# Patient Record
Sex: Female | Born: 1965 | Race: Black or African American | Hispanic: No | Smoking: Never smoker
Health system: Southern US, Community
[De-identification: ages and names within clinical notes are randomized; demographics above are authoritative.]

## PROBLEM LIST (undated history)

## (undated) DIAGNOSIS — G629 Polyneuropathy, unspecified: Secondary | ICD-10-CM

## (undated) DIAGNOSIS — M1990 Unspecified osteoarthritis, unspecified site: Secondary | ICD-10-CM

## (undated) DIAGNOSIS — E7800 Pure hypercholesterolemia, unspecified: Secondary | ICD-10-CM

## (undated) DIAGNOSIS — K649 Unspecified hemorrhoids: Secondary | ICD-10-CM

## (undated) DIAGNOSIS — C3490 Malignant neoplasm of unspecified part of unspecified bronchus or lung: Secondary | ICD-10-CM

## (undated) DIAGNOSIS — I10 Essential (primary) hypertension: Secondary | ICD-10-CM

## (undated) DIAGNOSIS — R000 Tachycardia, unspecified: Secondary | ICD-10-CM

## (undated) DIAGNOSIS — I428 Other cardiomyopathies: Secondary | ICD-10-CM

## (undated) DIAGNOSIS — E039 Hypothyroidism, unspecified: Secondary | ICD-10-CM

## (undated) DIAGNOSIS — J45909 Unspecified asthma, uncomplicated: Secondary | ICD-10-CM

## (undated) DIAGNOSIS — J189 Pneumonia, unspecified organism: Secondary | ICD-10-CM

## (undated) DIAGNOSIS — E669 Obesity, unspecified: Secondary | ICD-10-CM

## (undated) HISTORY — PX: OTHER SURGICAL HISTORY: SHX169

## (undated) HISTORY — PX: TUBAL LIGATION: SHX77

## (undated) HISTORY — PX: COLONOSCOPY: SHX174

---

## 1998-11-28 ENCOUNTER — Emergency Department (HOSPITAL_COMMUNITY): Admission: EM | Admit: 1998-11-28 | Discharge: 1998-11-28 | Payer: Self-pay | Admitting: Emergency Medicine

## 1998-11-29 ENCOUNTER — Encounter: Payer: Self-pay | Admitting: Emergency Medicine

## 1999-03-04 ENCOUNTER — Emergency Department (HOSPITAL_COMMUNITY): Admission: EM | Admit: 1999-03-04 | Discharge: 1999-03-04 | Payer: Self-pay | Admitting: *Deleted

## 1999-07-23 ENCOUNTER — Emergency Department (HOSPITAL_COMMUNITY): Admission: EM | Admit: 1999-07-23 | Discharge: 1999-07-23 | Payer: Self-pay | Admitting: Emergency Medicine

## 2002-01-30 ENCOUNTER — Ambulatory Visit (HOSPITAL_COMMUNITY): Admission: RE | Admit: 2002-01-30 | Discharge: 2002-01-30 | Payer: Self-pay | Admitting: Internal Medicine

## 2002-01-30 ENCOUNTER — Encounter: Payer: Self-pay | Admitting: Internal Medicine

## 2002-02-21 ENCOUNTER — Ambulatory Visit (HOSPITAL_COMMUNITY): Admission: RE | Admit: 2002-02-21 | Discharge: 2002-02-21 | Payer: Self-pay | Admitting: Internal Medicine

## 2002-02-21 ENCOUNTER — Encounter: Payer: Self-pay | Admitting: Internal Medicine

## 2003-04-18 ENCOUNTER — Emergency Department (HOSPITAL_COMMUNITY): Admission: EM | Admit: 2003-04-18 | Discharge: 2003-04-19 | Payer: Self-pay | Admitting: Emergency Medicine

## 2003-11-13 ENCOUNTER — Emergency Department (HOSPITAL_COMMUNITY): Admission: EM | Admit: 2003-11-13 | Discharge: 2003-11-13 | Payer: Self-pay | Admitting: Emergency Medicine

## 2004-01-22 ENCOUNTER — Emergency Department (HOSPITAL_COMMUNITY): Admission: EM | Admit: 2004-01-22 | Discharge: 2004-01-22 | Payer: Self-pay | Admitting: Emergency Medicine

## 2004-03-16 IMAGING — CT CT HEAD W/O CM
1 of 2 series · 13 of 30 positions shown, 17 images · non-contrast
Comparison: none

CLINICAL DATA: Severe headache.  Dizziness.  Blurred vision.  
 HEAD CT WITHOUT CONTRAST
 Routine noncontrast head CT was performed.  Comparison is made to prior study on 11/29/98.  
 There is no evidence of intracranial hemorrhage, brain edema, or mass effect.  No abnormal extra-axial fluid collections are seen.  The ventricles are normal in size.  
 No skull abnormalities are seen.  Chronic mucosal thickening is again seen involving the ethmoid sinuses bilaterally, which has not significantly changed.  
 IMPRESSION
 No evidence of intracranial abnormality.  
 Chronic bilateral ethmoid sinus disease again noted.

[Series 2: brain · axial · 0.49mm/px · z∈[+152,+272]mm · 13 of 28 slices shown, 17 images]
[im 2/28  brain]
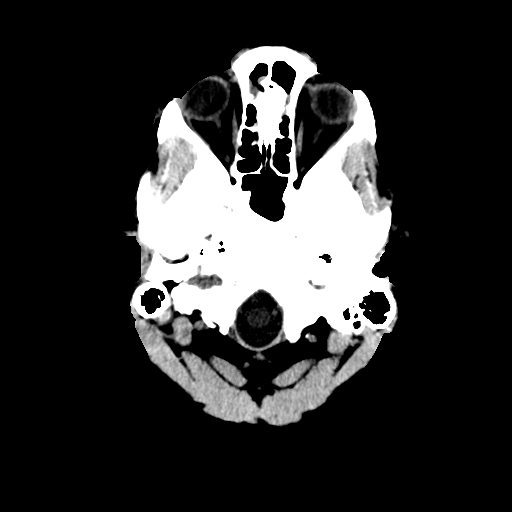
[im 2/28  bone]
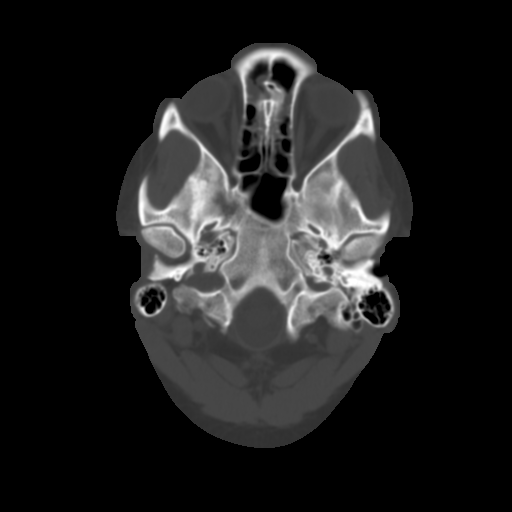
[im 4/28  brain]
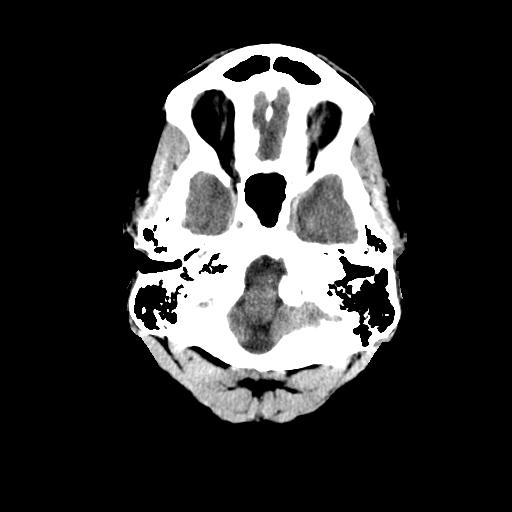
[im 6/28  brain]
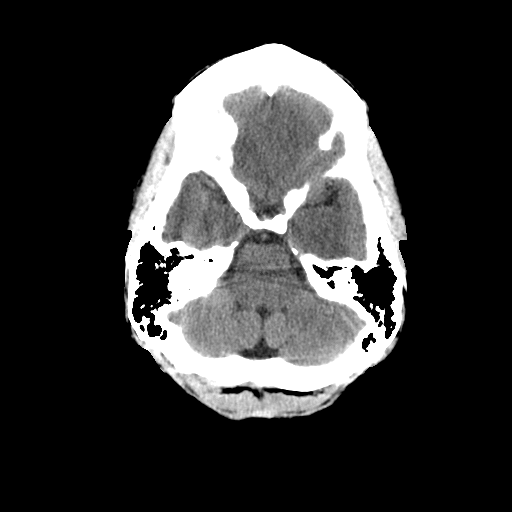
[im 8/28  brain]
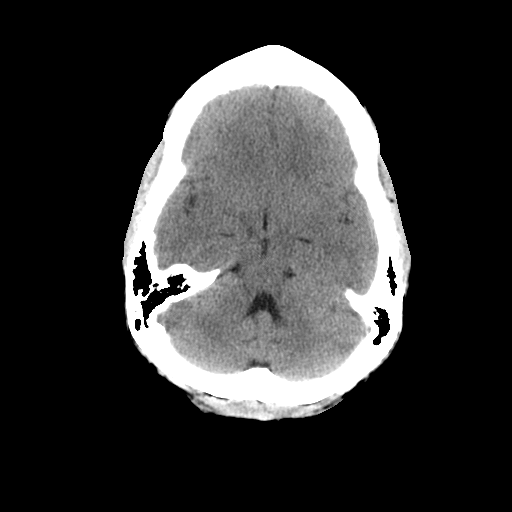
[im 10/28  brain]
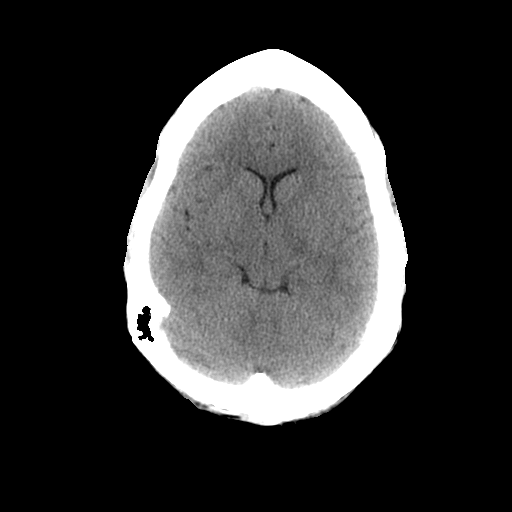
[im 10/28  bone]
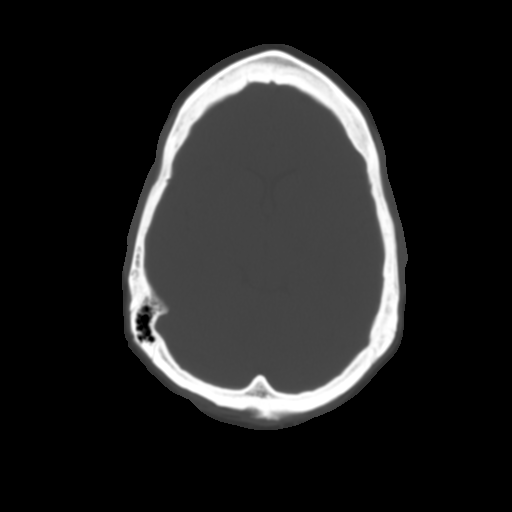
[im 12/28  brain]
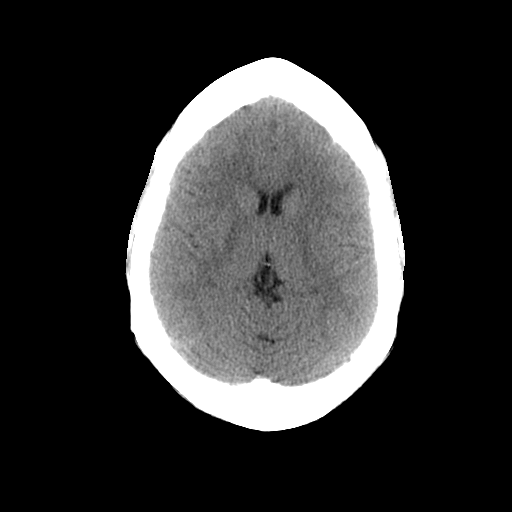
[im 14/28  brain]
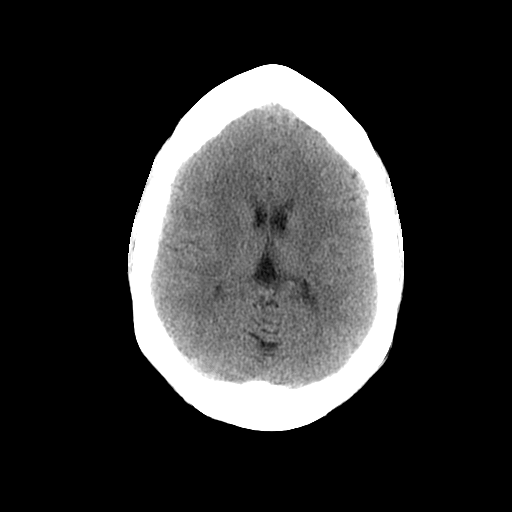
[im 16/28  brain]
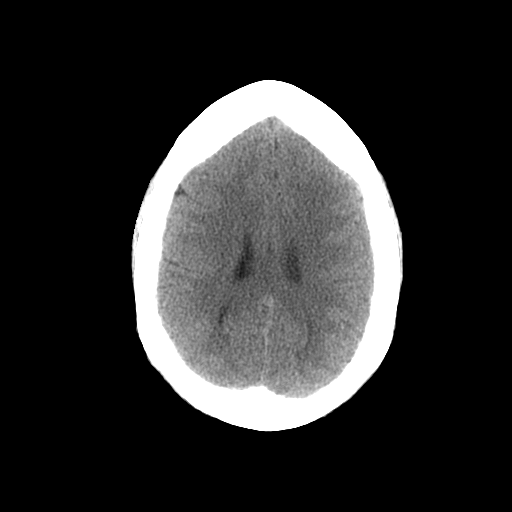
[im 18/28  brain]
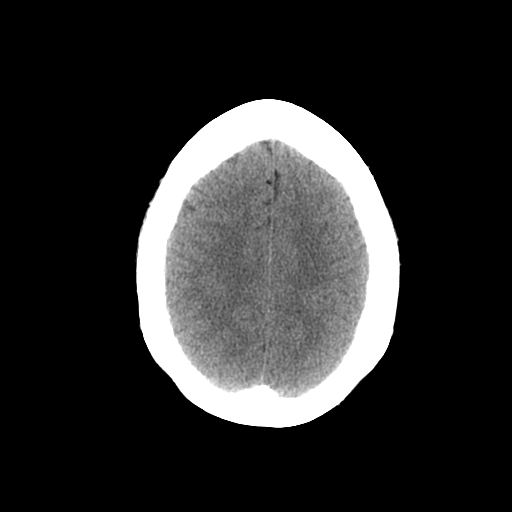
[im 18/28  bone]
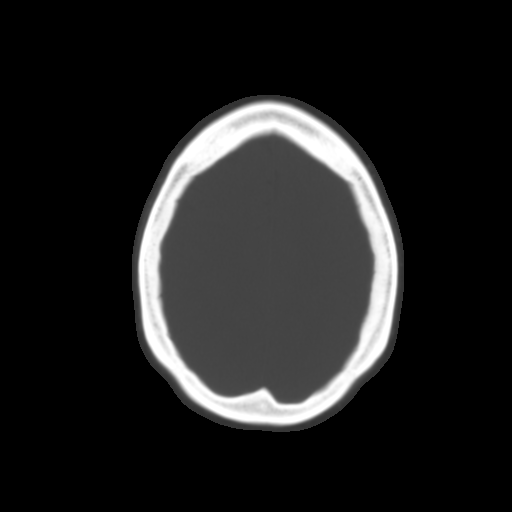
[im 20/28  brain]
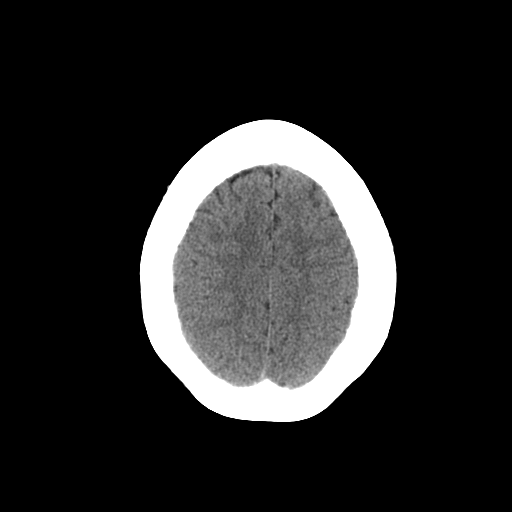
[im 22/28  brain]
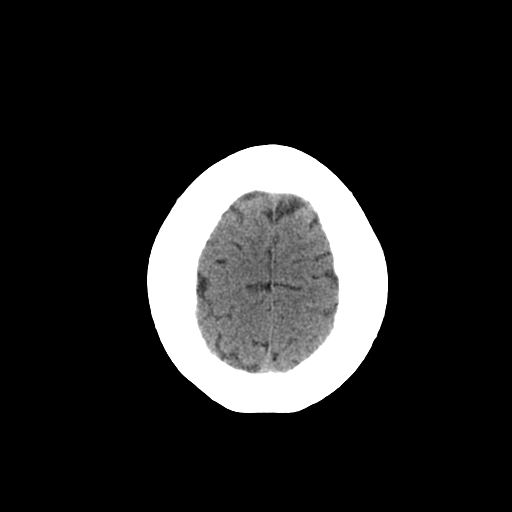
[im 24/28  brain]
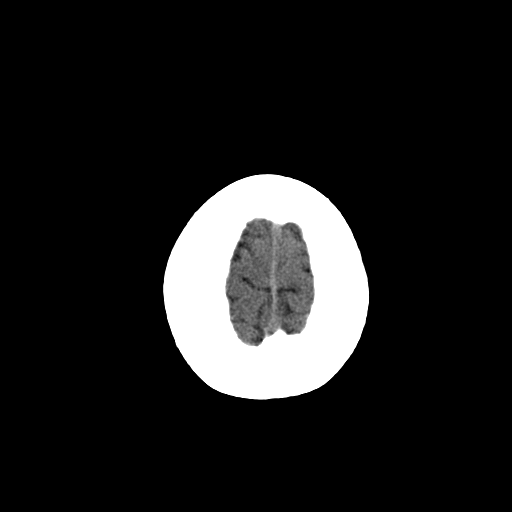
[im 26/28  brain]
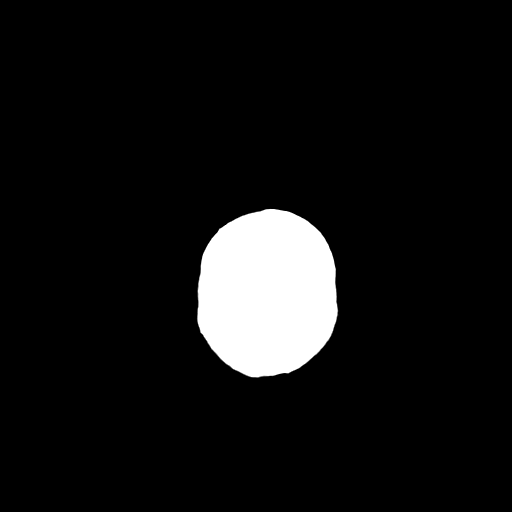
[im 26/28  bone]
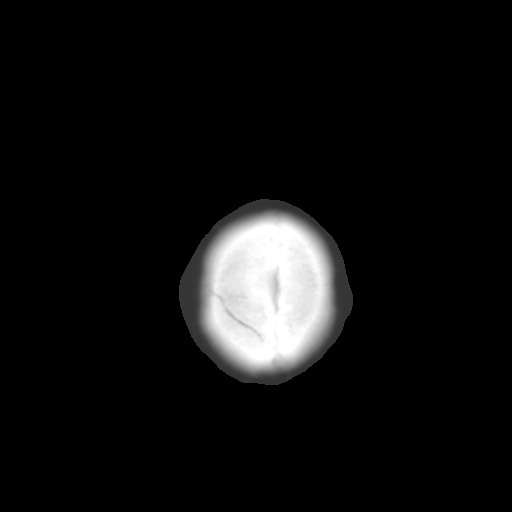

[13 of 30 positions shown; findings below may reference images not displayed]

## 2004-07-28 ENCOUNTER — Emergency Department (HOSPITAL_COMMUNITY): Admission: EM | Admit: 2004-07-28 | Discharge: 2004-07-28 | Payer: Self-pay | Admitting: Emergency Medicine

## 2004-08-27 ENCOUNTER — Emergency Department (HOSPITAL_COMMUNITY): Admission: EM | Admit: 2004-08-27 | Discharge: 2004-08-28 | Payer: Self-pay | Admitting: Emergency Medicine

## 2004-12-01 ENCOUNTER — Emergency Department (HOSPITAL_COMMUNITY): Admission: EM | Admit: 2004-12-01 | Discharge: 2004-12-01 | Payer: Self-pay | Admitting: Family Medicine

## 2004-12-03 ENCOUNTER — Emergency Department (HOSPITAL_COMMUNITY): Admission: EM | Admit: 2004-12-03 | Discharge: 2004-12-03 | Payer: Self-pay | Admitting: Family Medicine

## 2005-01-06 ENCOUNTER — Emergency Department (HOSPITAL_COMMUNITY): Admission: EM | Admit: 2005-01-06 | Discharge: 2005-01-06 | Payer: Self-pay | Admitting: Family Medicine

## 2005-06-25 IMAGING — CR DG CHEST 1V PORT
1 series · 1 of 1 positions shown · non-contrast
Comparison: none

CLINICAL DATA: Chest pain and shortness of breath.
 PORTABLE CHEST SINGLE VIEW:
 Single portable view of the chest without prior studies for comparison demonstrates multiple wires and leads overlying the chest, which could obscure subtle findings.  Heart size is normal.  No acute pulmonary findings.

[view not recorded]
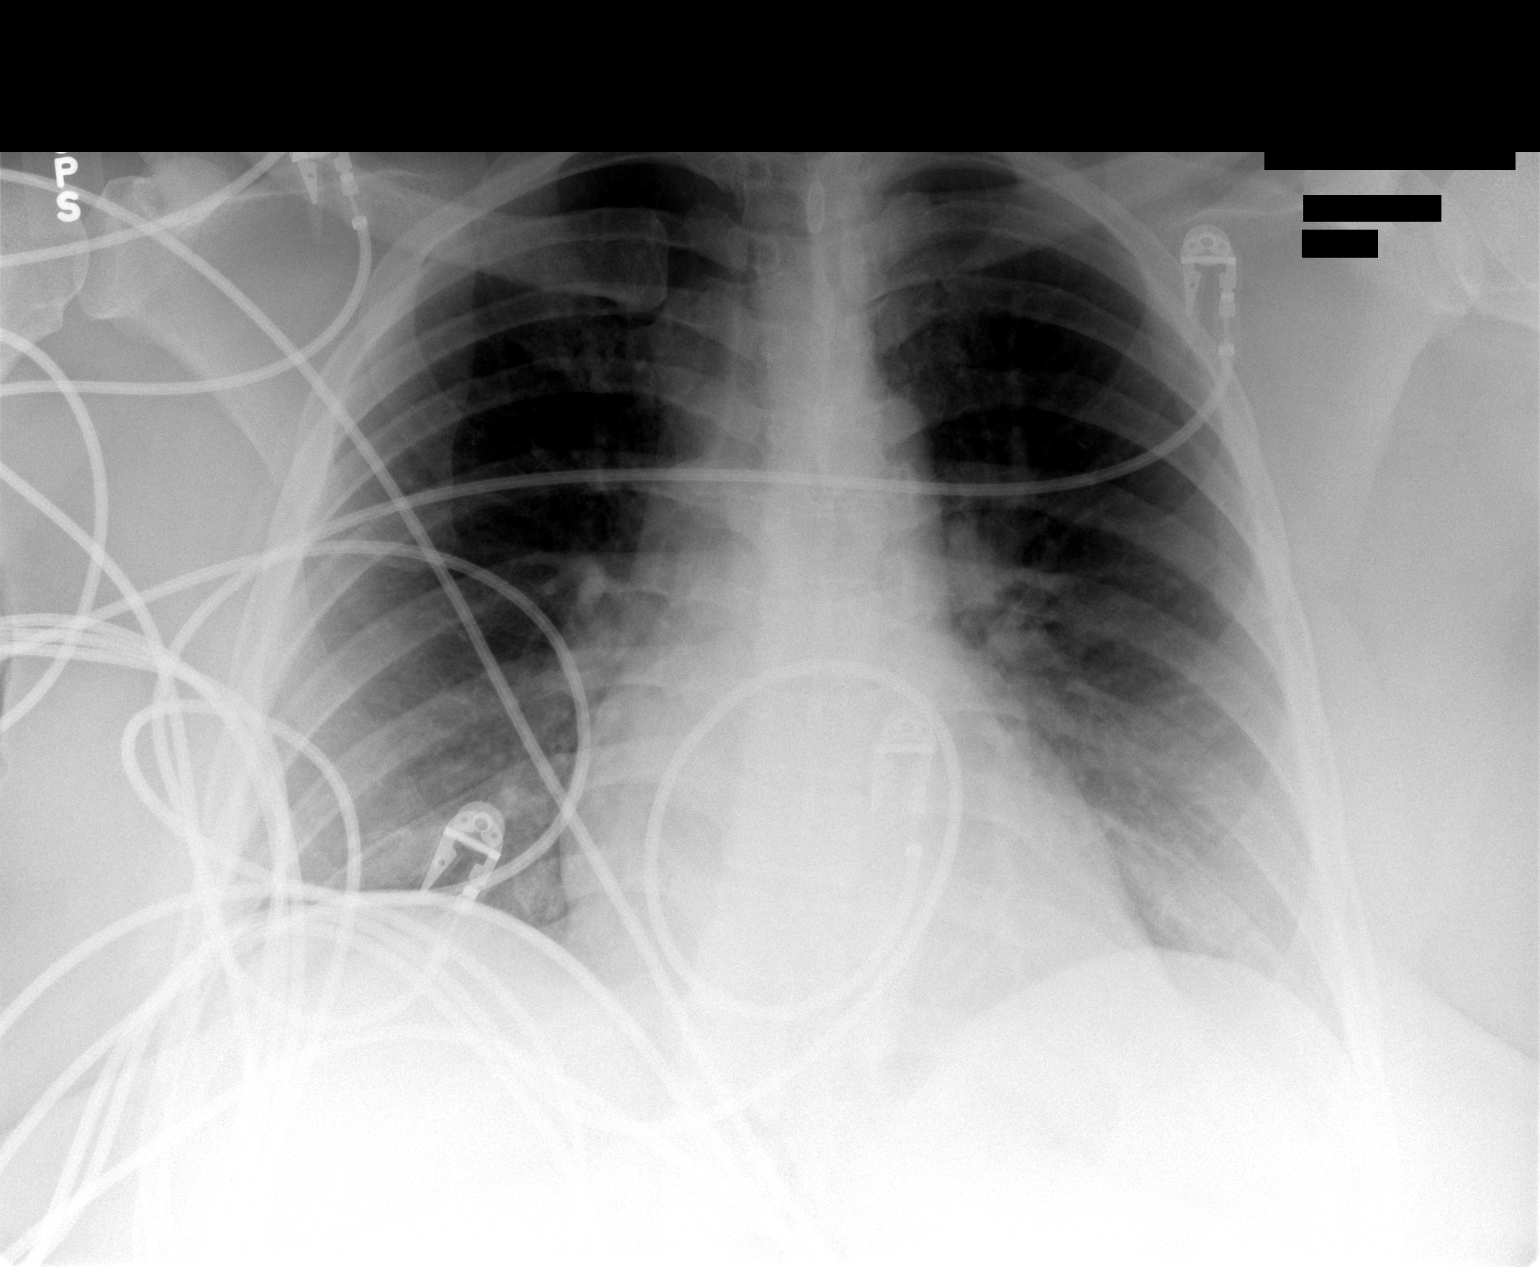

[1 of 1 positions shown; findings below may reference images not displayed]

IMPRESSION: No acute cardiopulmonary findings.

## 2006-01-27 ENCOUNTER — Emergency Department (HOSPITAL_COMMUNITY): Admission: EM | Admit: 2006-01-27 | Discharge: 2006-01-27 | Payer: Self-pay | Admitting: Emergency Medicine

## 2006-11-27 ENCOUNTER — Emergency Department (HOSPITAL_COMMUNITY): Admission: EM | Admit: 2006-11-27 | Discharge: 2006-11-27 | Payer: Self-pay | Admitting: *Deleted

## 2007-05-04 ENCOUNTER — Emergency Department (HOSPITAL_COMMUNITY): Admission: EM | Admit: 2007-05-04 | Discharge: 2007-05-04 | Payer: Self-pay | Admitting: Emergency Medicine

## 2008-02-14 ENCOUNTER — Encounter: Admission: RE | Admit: 2008-02-14 | Discharge: 2008-02-14 | Payer: Self-pay | Admitting: Internal Medicine

## 2008-03-31 IMAGING — CR DG CHEST 2V
2 series · 2 of 2 positions shown · non-contrast
Comparison: 07/28/2004

CLINICAL DATA: Chest and abdominal pain. 
 CHEST - 2 VIEW:

[w chest lat]
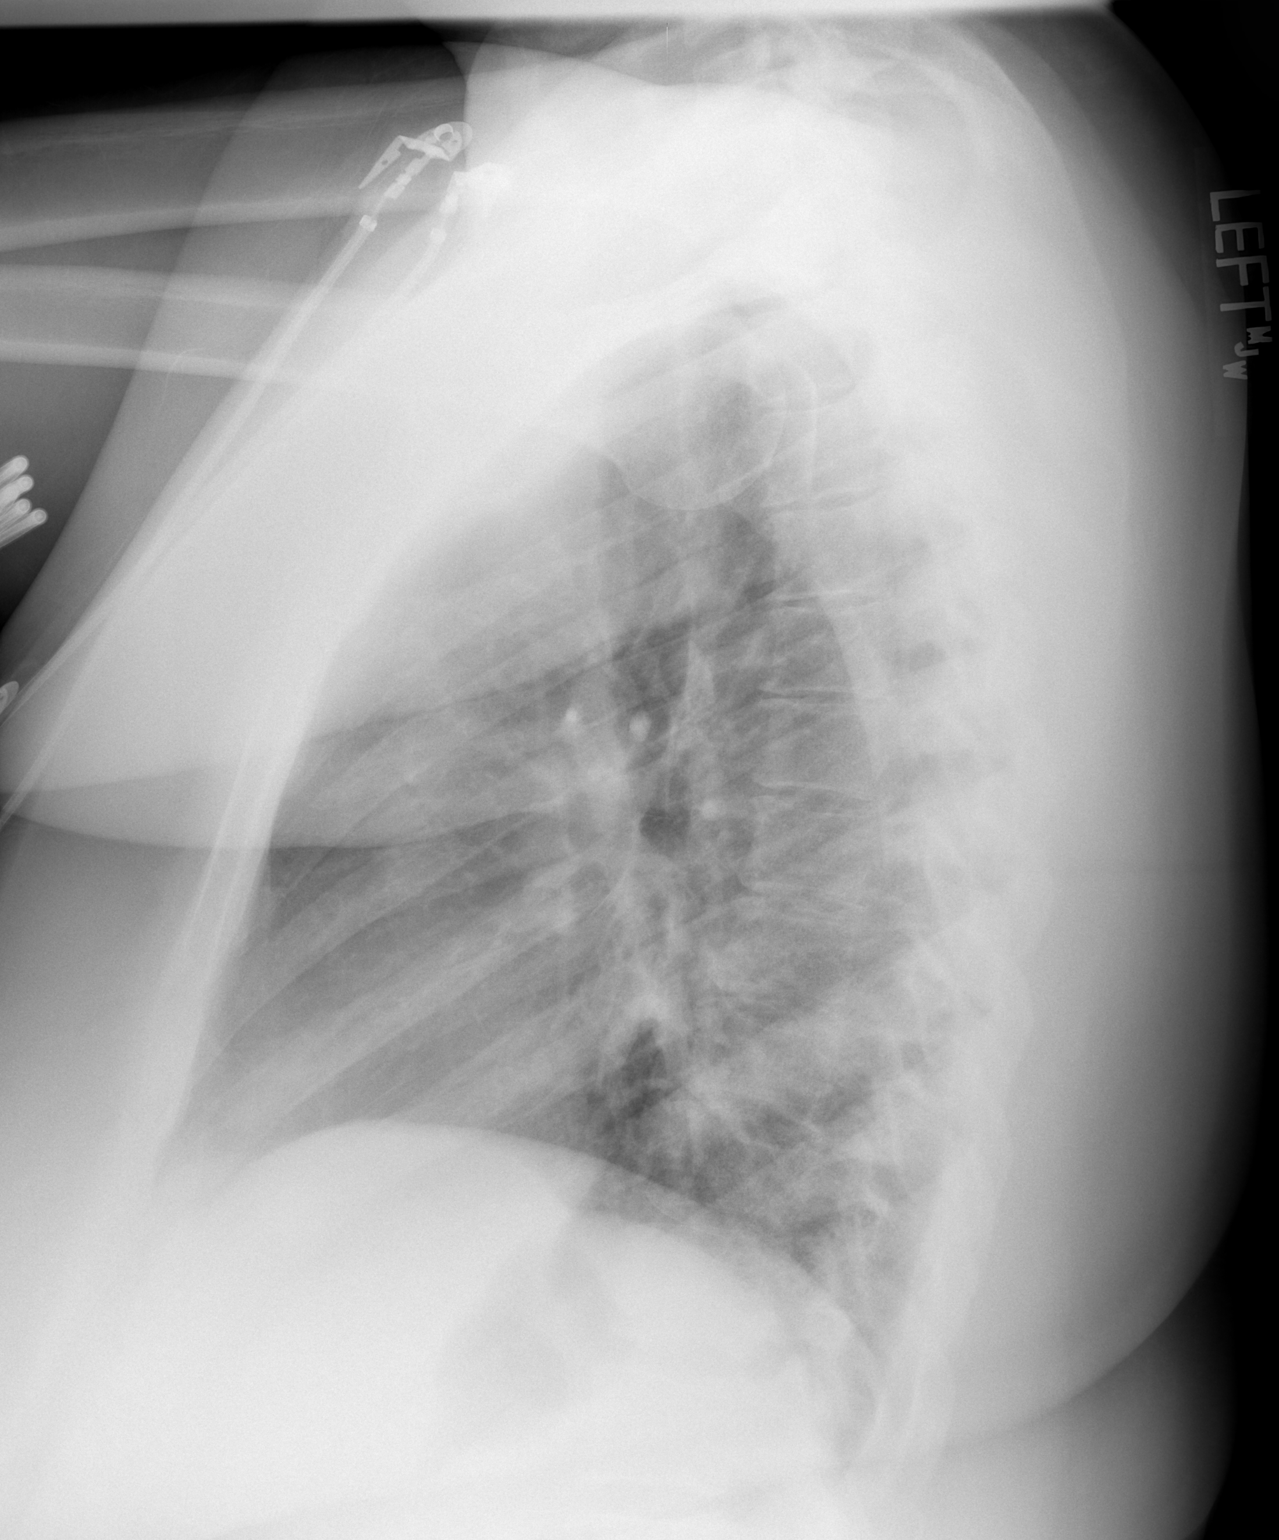

[w chest pa]
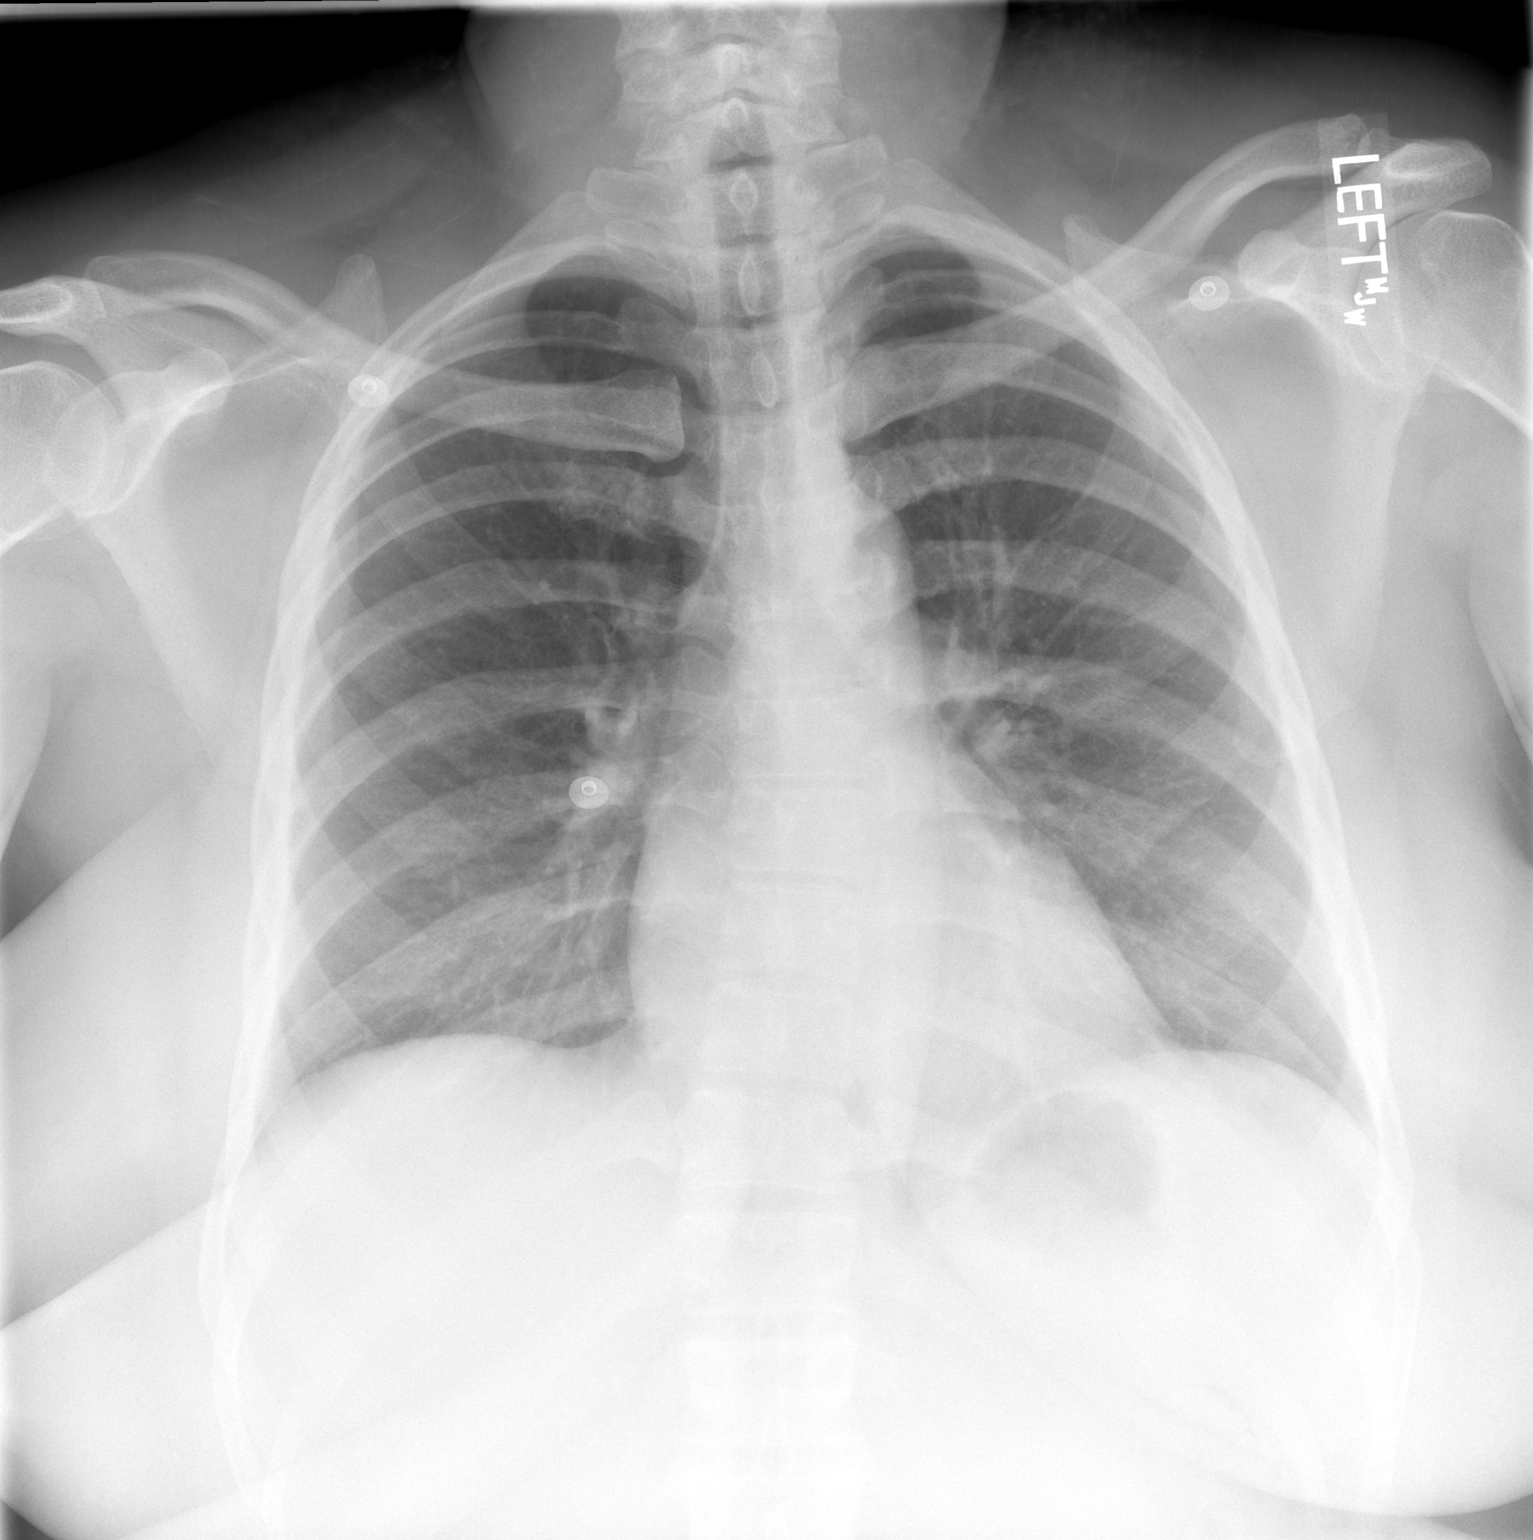

[2 of 2 positions shown; findings below may reference images not displayed]

FINDINGS: The heart size and mediastinal contours are within normal limits.  Both lungs are clear.  The visualized skeletal structures are unremarkable.
IMPRESSION: No active cardiopulmonary disease.

## 2008-03-31 IMAGING — US US TRANSVAGINAL NON-OB
1 series · 14 of 25 positions shown · non-contrast
Comparison: None

CLINICAL DATA: Abdominal pain

TRANSABDOMINAL AND TRANSVAGINAL PELVIC ULTRASOUND
TECHNIQUE: Both transabdominal and transvaginal ultrasound examinations of the
pelvis were performed including evaluation of the uterus, ovaries, adnexal
regions, and pelvic cul-de-sac.

[Series 1: unknown · 0.43mm/px · 14 of 50 slices shown]
[im 1/50]
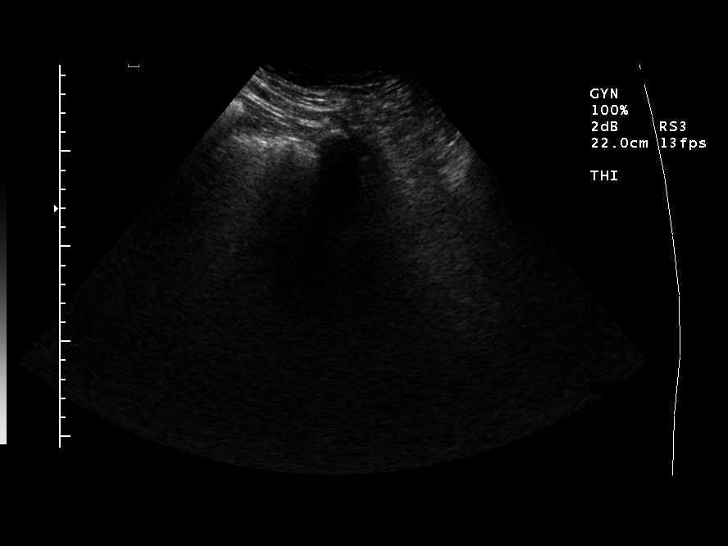
[im 5/50]
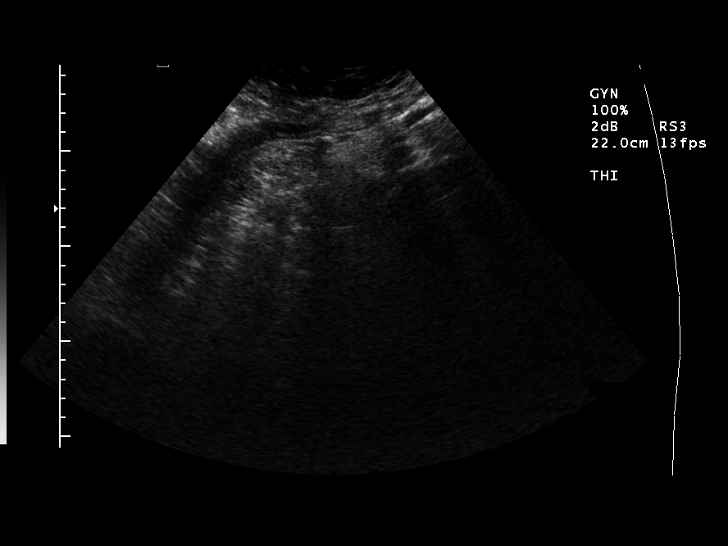
[im 9/50]
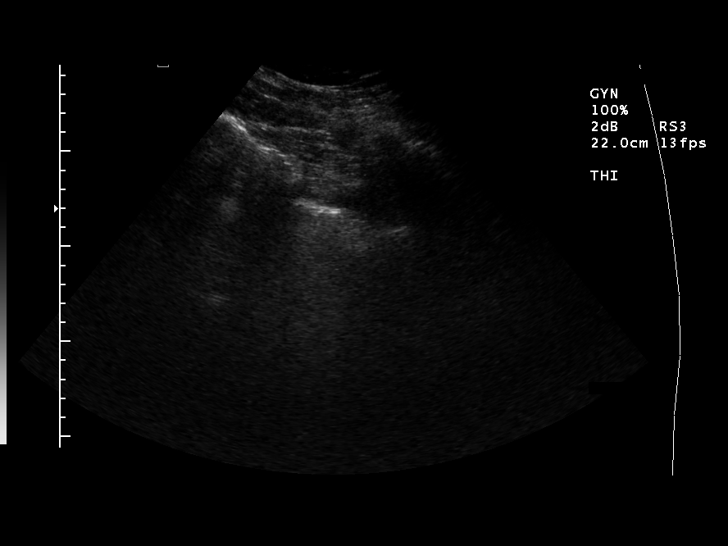
[im 13/50]
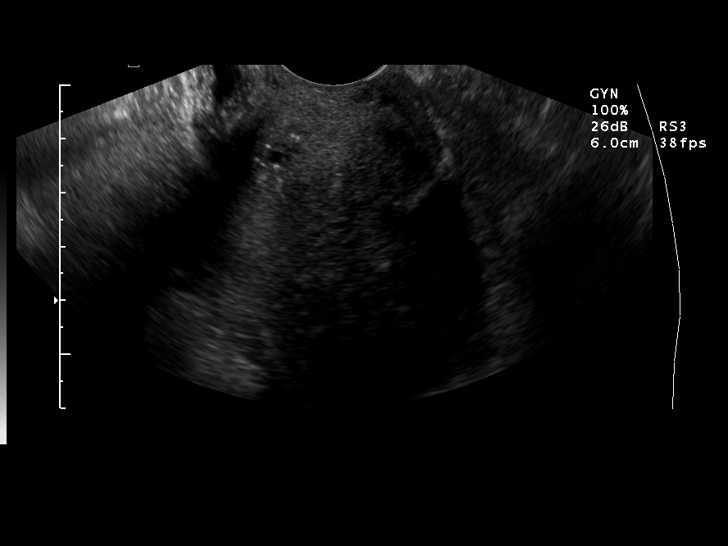
[im 17/50]
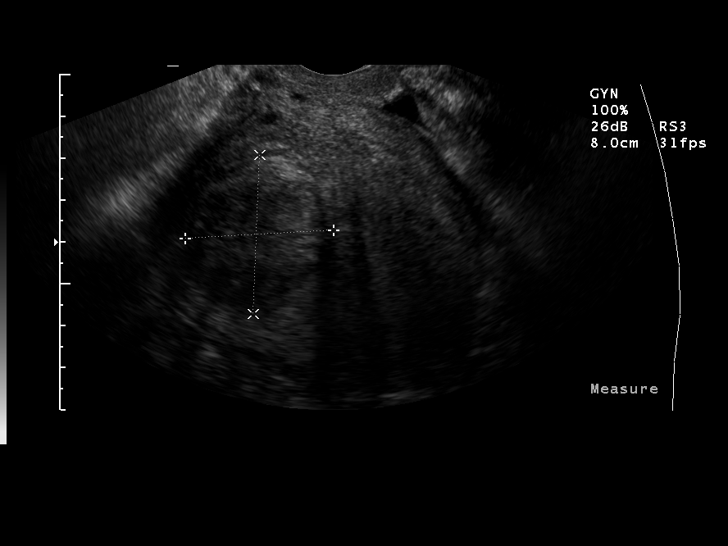
[im 19/50]
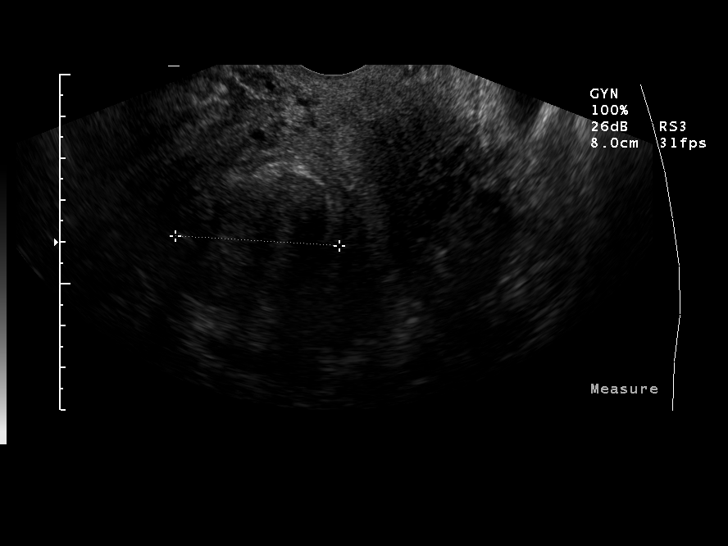
[im 23/50]
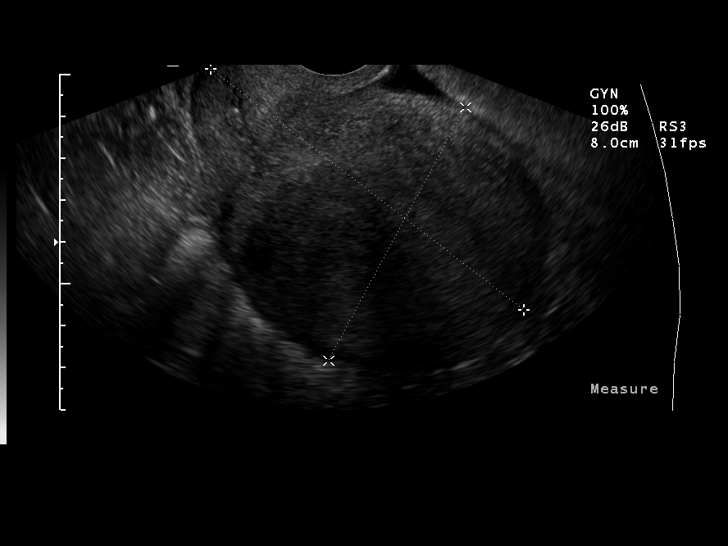
[im 27/50]
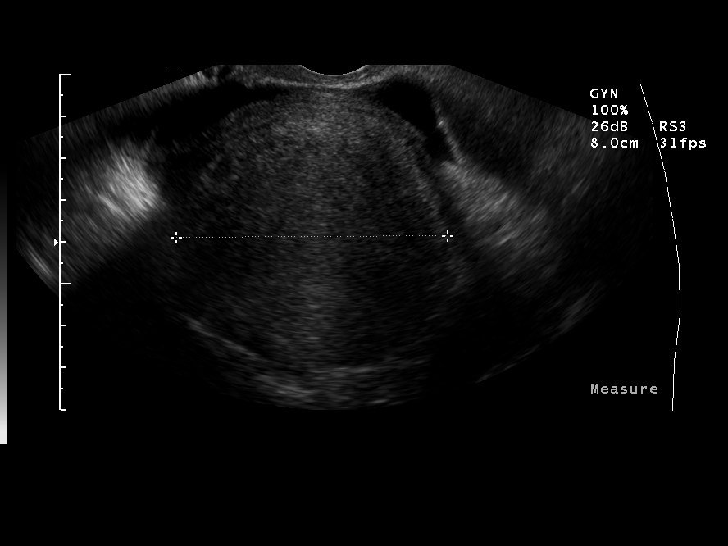
[im 31/50]
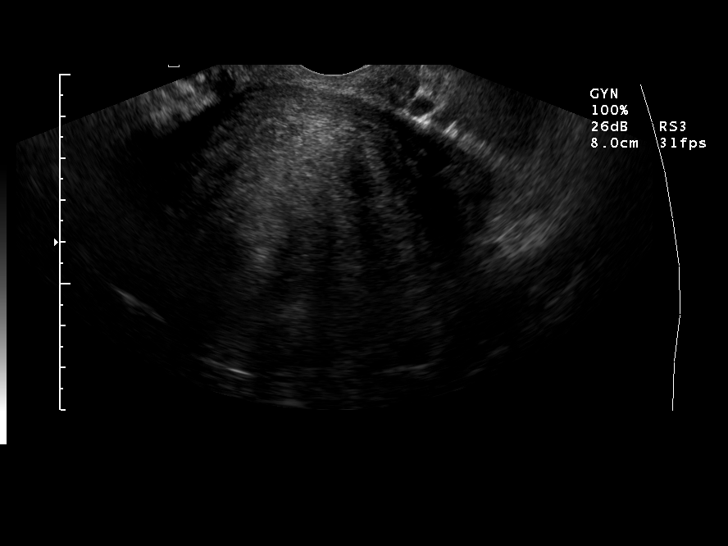
[im 33/50]
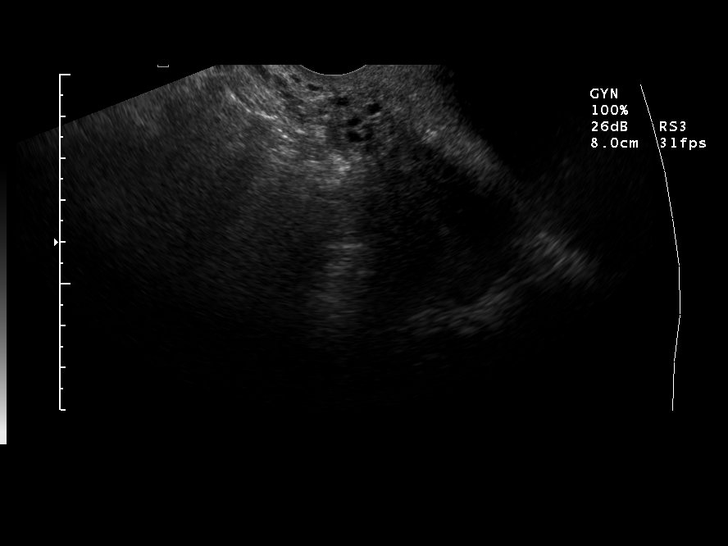
[im 37/50]
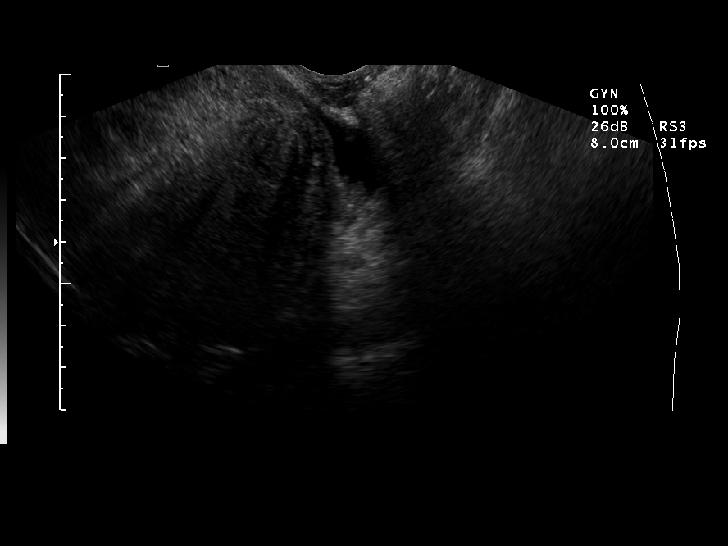
[im 41/50]
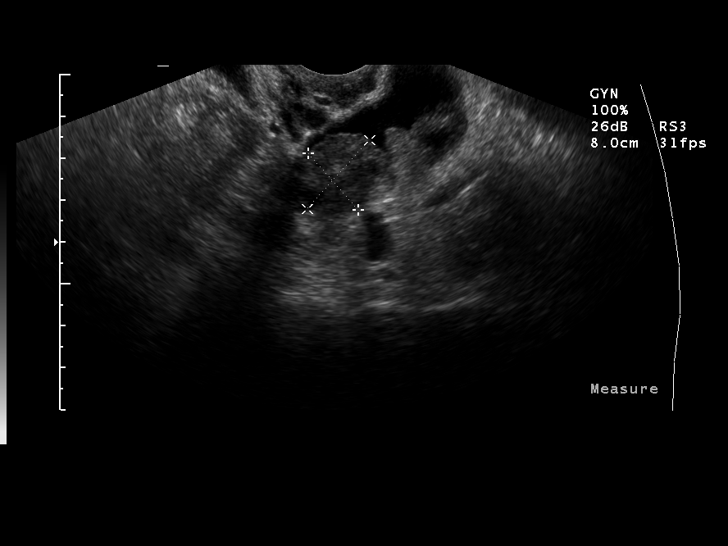
[im 45/50]
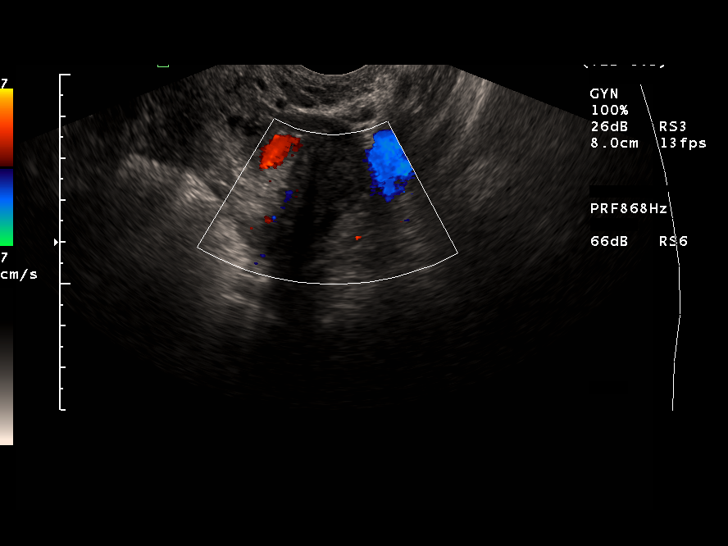
[im 50/50]
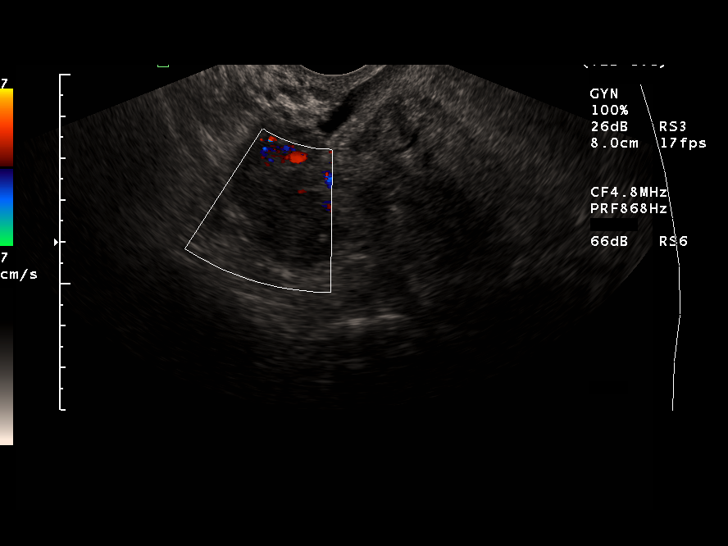

[14 of 25 positions shown; findings below may reference images not displayed]

FINDINGS: Uterus 9.4 x 6.9 x 6.5 cm. Enlarged. A right-sided uterine body lesion
measures 3.9 x 3.5 x 3.8 cm. A central left-sided uterine body lesion measures
1.6 x 1.7 x 1.4 cm.

Endometrium normal at 1.3 cm.

Right ovary 3.1 x 1.7 x 1.6 cm. Left ovary 1.8 x 2.2 x 2.1 cm. Normal ovarian
morphology. Small free fluid may be physiologic. Primarily identified adjacent
to the left ovary.

IMPRESSION

1. Enlarged uterus with uterine masses most consistent with fibroids.
2. Normal appearance of the ovaries and endometrium.
3. Small free fluid primarily positioned adjacent to the left ovary. Question
recently ruptured cyst or follicle.

## 2008-05-20 ENCOUNTER — Emergency Department (HOSPITAL_COMMUNITY): Admission: EM | Admit: 2008-05-20 | Discharge: 2008-05-20 | Payer: Self-pay | Admitting: Emergency Medicine

## 2008-08-05 ENCOUNTER — Encounter: Admission: RE | Admit: 2008-08-05 | Discharge: 2008-11-03 | Payer: Self-pay | Admitting: Internal Medicine

## 2008-09-03 ENCOUNTER — Emergency Department (HOSPITAL_COMMUNITY): Admission: EM | Admit: 2008-09-03 | Discharge: 2008-09-04 | Payer: Self-pay | Admitting: Emergency Medicine

## 2009-04-08 ENCOUNTER — Emergency Department (HOSPITAL_COMMUNITY): Admission: EM | Admit: 2009-04-08 | Discharge: 2009-04-09 | Payer: Self-pay | Admitting: Emergency Medicine

## 2009-04-17 IMAGING — MR MR HEAD WO/W CM
7 of 13 series · 23 of 48 positions shown · IV contrast (multihance)
Comparison: Head CT 05/20/2008 and 04/19/2003.

CLINICAL DATA: 42-year-old female with sudden onset left-sided
numbness.  Hypertension and diabetes.

MRI HEAD WITHOUT AND WITH CONTRAST
TECHNIQUE: Multiplanar, multiecho pulse sequences of the brain and
surrounding structures were obtained according to standard protocol
without and with intravenous contrast
Contrast: 20 ml MultiHance.

[Series 4: DWI · axial · 5.0mm · 1.09mm/px · z∈[-47,+96]mm · 5 of 60 slices shown (1 of 2)]
[im 1/60]
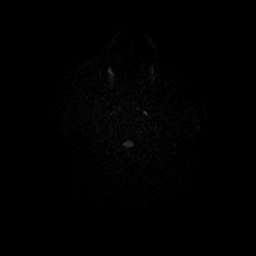
[im 15/60]
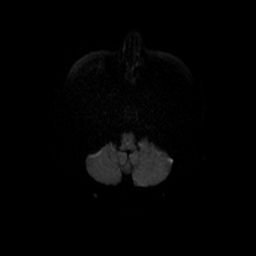
[im 30/60]
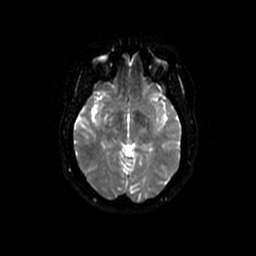
[im 45/60]
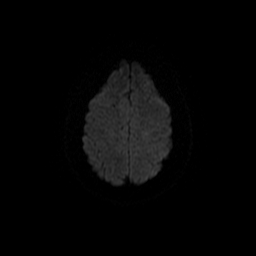
[im 60/60]
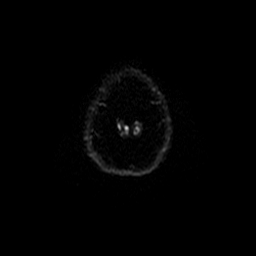

[Series 5: T2 · axial · 5.0mm · 0.43mm/px · z∈[-48,+94]mm · 2 of 23 slices shown (1 of 2)]
[im 1/23]
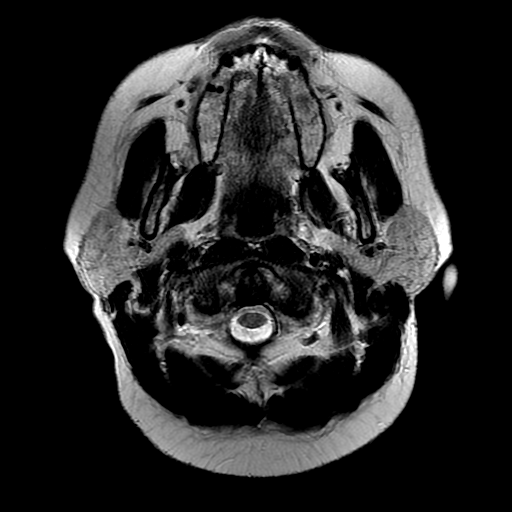
[im 23/23]
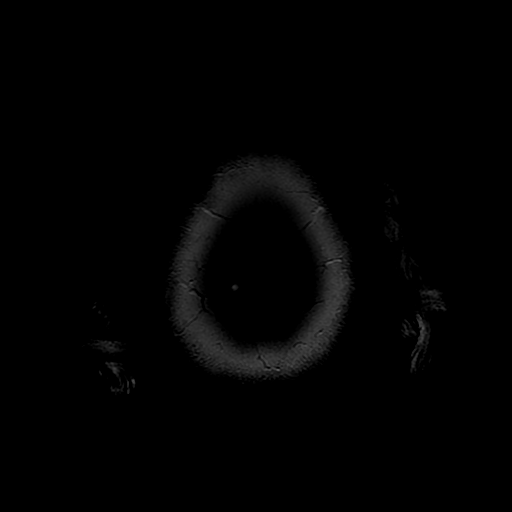

[Series 6: FLAIR · axial · 5.0mm · 0.43mm/px · z∈[-53,+99]mm · 2 of 23 slices shown (1 of 2)]
[im 1/23]
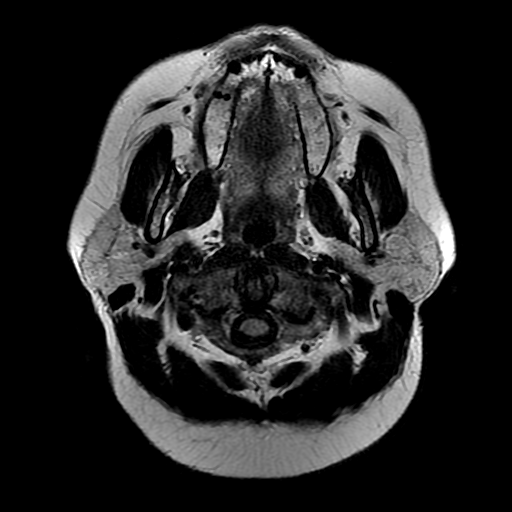
[im 23/23]
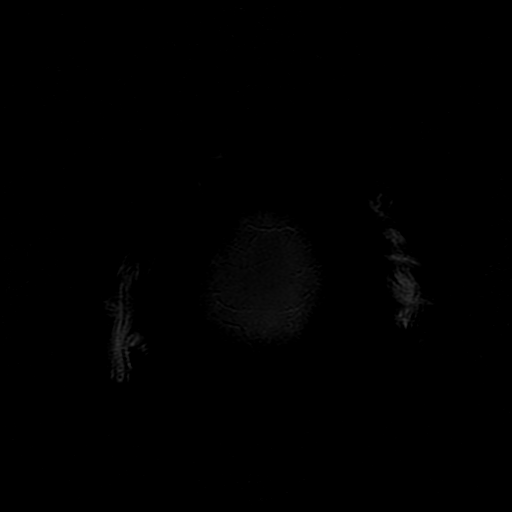

[Series 8: T2 · coronal · 5.0mm · 0.45mm/px · 1 of 24 slices shown (2 of 2)]
[im 1/24]
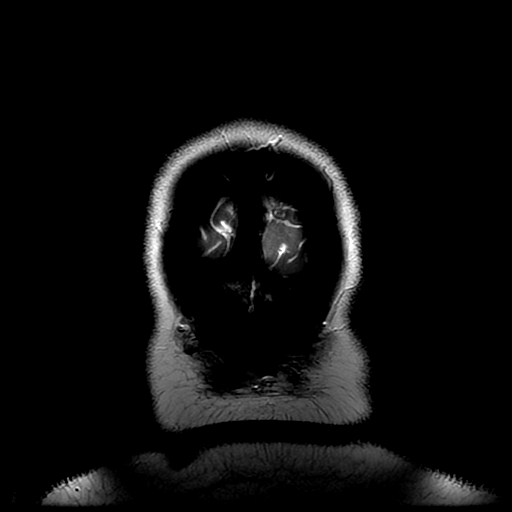

[Series 10: FLAIR · sagittal · 1.6mm · 0.47mm/px · 9 of 168 slices shown (2 of 2)]
[im 1/168]
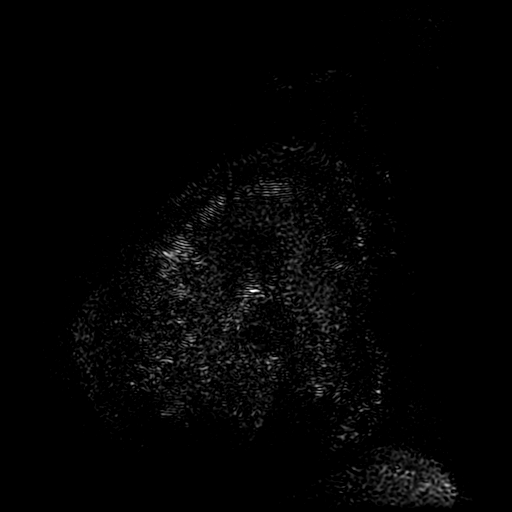
[im 28/168]
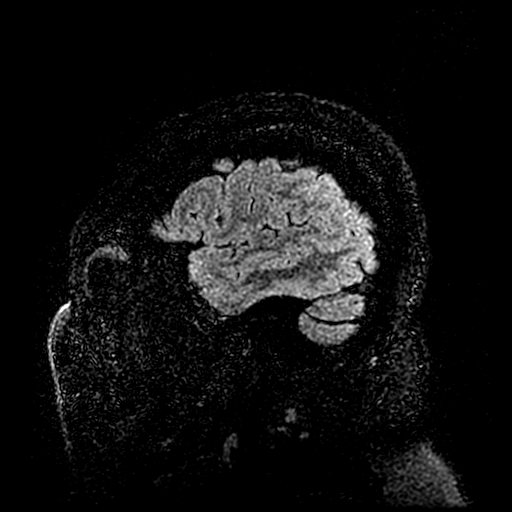
[im 56/168]
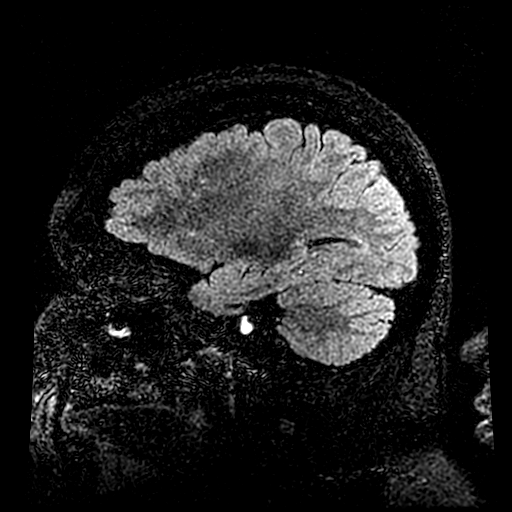
[im 70/168]
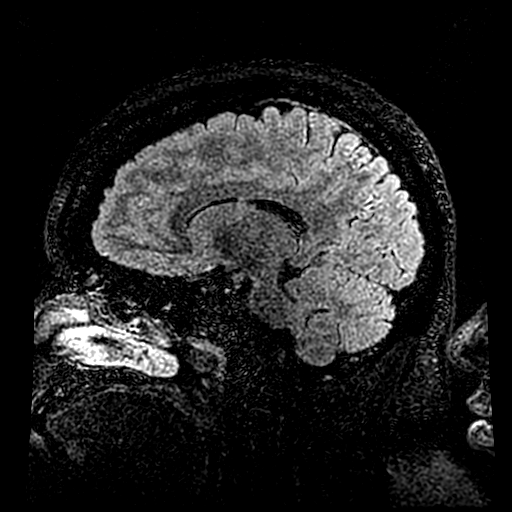
[im 84/168]
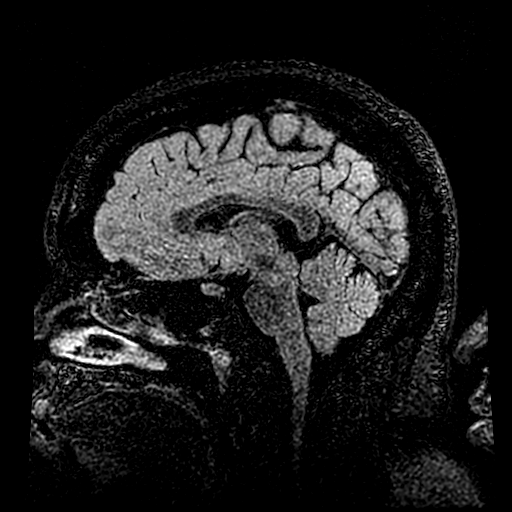
[im 98/168]
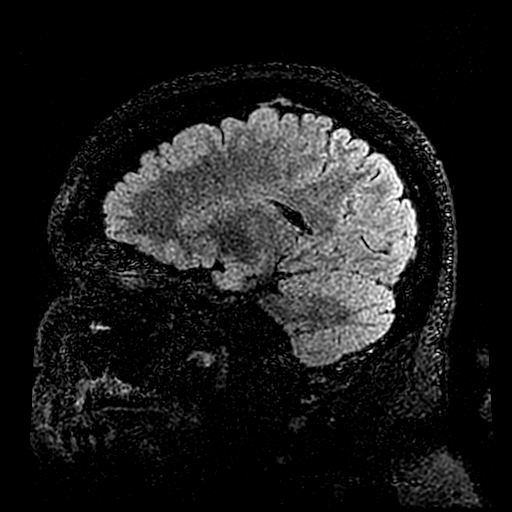
[im 112/168]
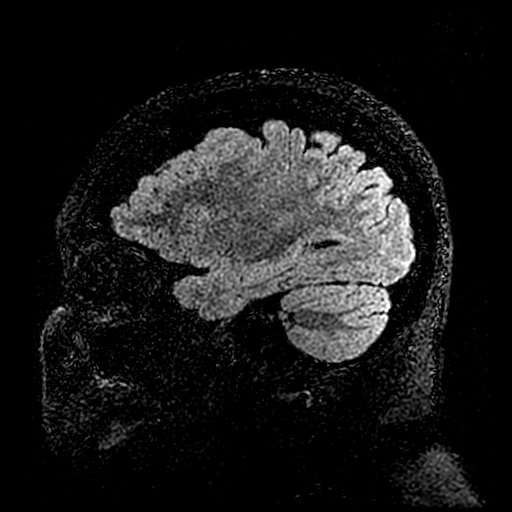
[im 140/168]
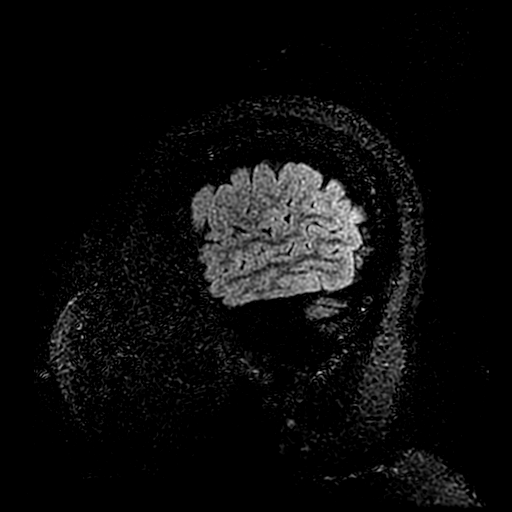
[im 168/168]
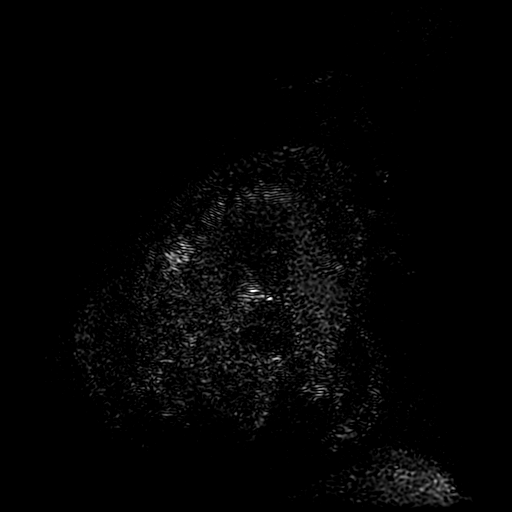

[Series 12: T1 post-contrast · coronal · 5.0mm · 0.45mm/px · 2 of 24 slices shown]
[im 1/24]
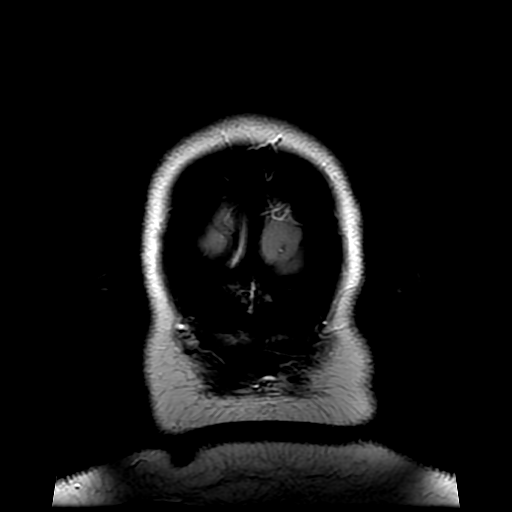
[im 24/24]
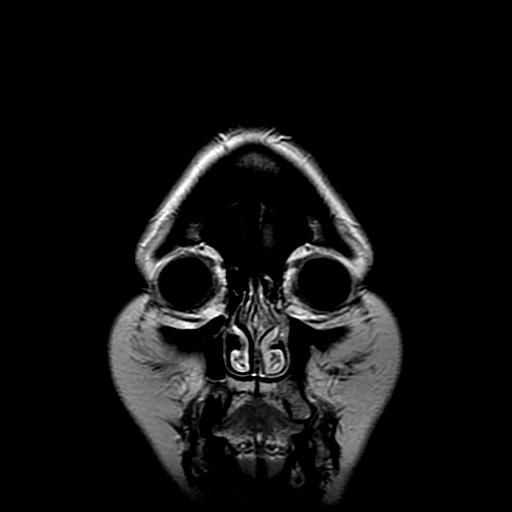

[Series 400: DWI · axial · 5.0mm · 1.09mm/px · z∈[-47,+96]mm · 2 of 30 slices shown (2 of 2)]
[im 1/30]
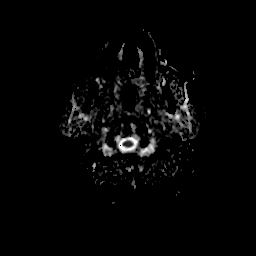
[im 30/30]
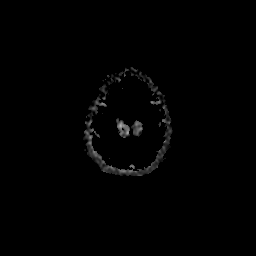

[23 of 48 positions shown; findings below may reference images not displayed]

FINDINGS: Stable cerebral volume.  No restricted diffusion, midline
shift, mass effect, ventriculomegaly, extra-axial collection or
intracranial hemorrhage.  Cervicomedullary junction and pituitary
are within normal limits.  Occasional small foci of increased T2
and FLAIR signal in the cerebral white matter in a nonspecific
distribution.  The left hemisphere is slightly more affected than
the right.  Elsewhere gray and white matter signal is within normal
limits throughout the brain.  No abnormal enhancement identified.
Increased T2 and FLAIR signal at the left skull base anterior to
the left internal auditory canal is favored to represent fluid in
the aerated left petrous air cells. Major intracranial vascular
flow voids are within normal limits; the posterior circulation is
diminutive and this is favored to be on the basis of fetal origins
of the posterior cerebral arteries.  Incidental Thornwaldt cyst.
Visualized paranasal sinuses and mastoids are clear.  Visualized
orbits and scalp soft tissues are within normal limits.
Hyperostosis and sclerosis of the skull is unchanged since 9006.
IMPRESSION: 1. No acute intracranial abnormality.
2.  Normal to slightly advanced nonspecific cerebral white matter
signal abnormality for age.  Favor small vessel ischemia in light
of clinical history.  Other considerations include sequelae of
hypercoagulable state, vasculitis, migraines, prior infection or
demyelination.

## 2009-04-17 IMAGING — CT CT HEAD W/O CM
1 series · 16 of 30 positions shown, 20 images · non-contrast
Comparison: Head CT 04/19/2003

CLINICAL DATA: CT HEAD WITHOUT CONTRAST
TECHNIQUE: Contiguous axial images were obtained from the base of
the skull through the vertex without contrast.

[Series 2: head_seq 4.5 h37s st · axial · 0.43mm/px · z∈[+1213,+1339]mm · 16 of 32 slices shown, 20 images]
[im 2/32  brain]
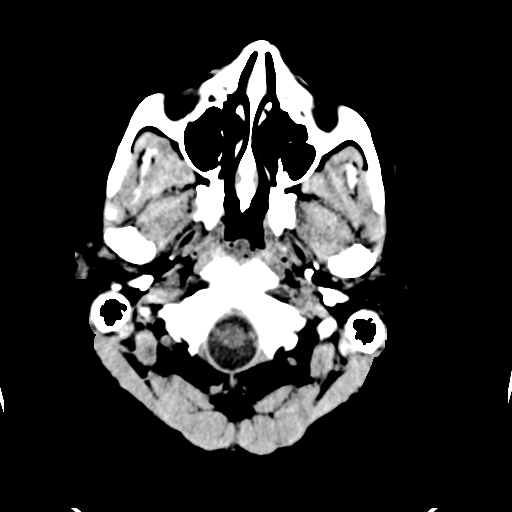
[im 2/32  bone]
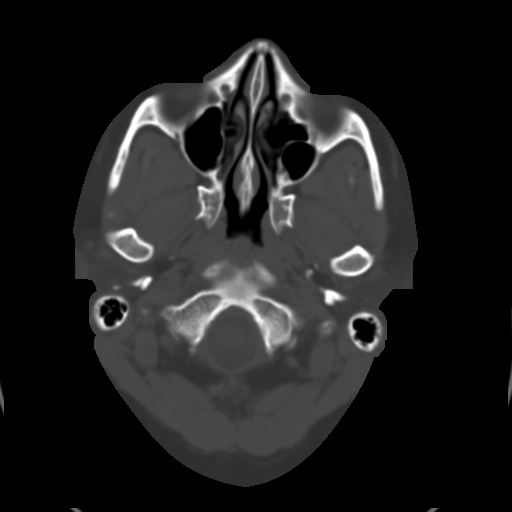
[im 4/32  brain]
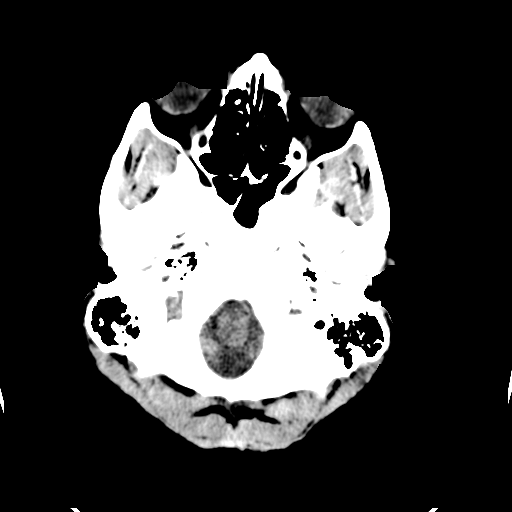
[im 6/32  brain]
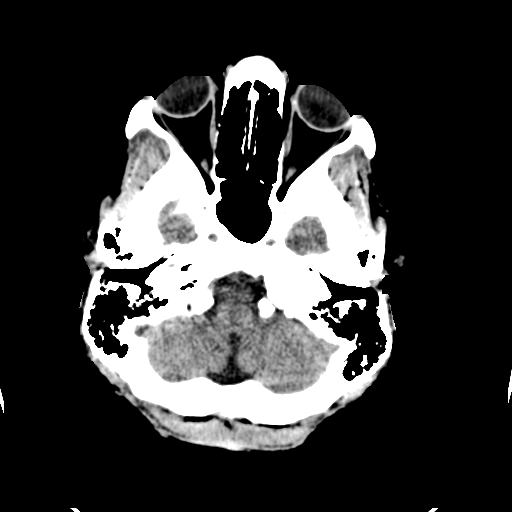
[im 8/32  brain]
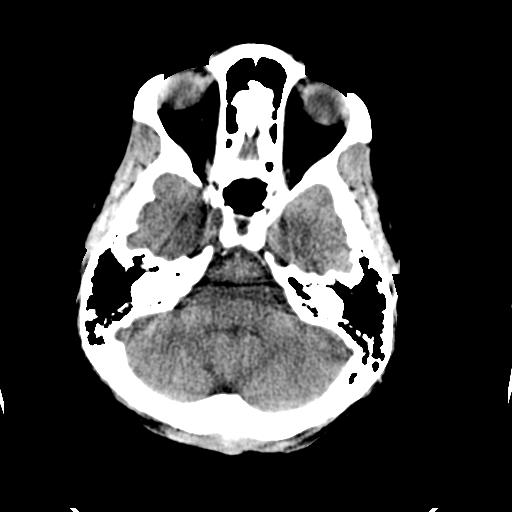
[im 9/32  brain]
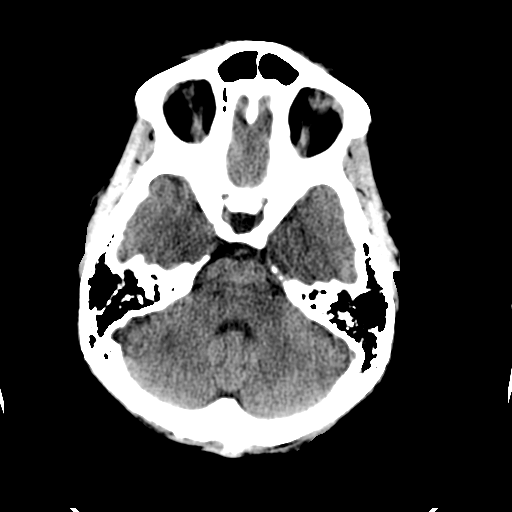
[im 9/32  bone]
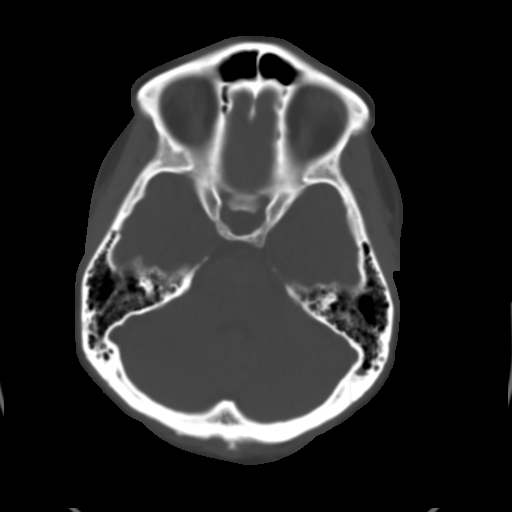
[im 11/32  brain]
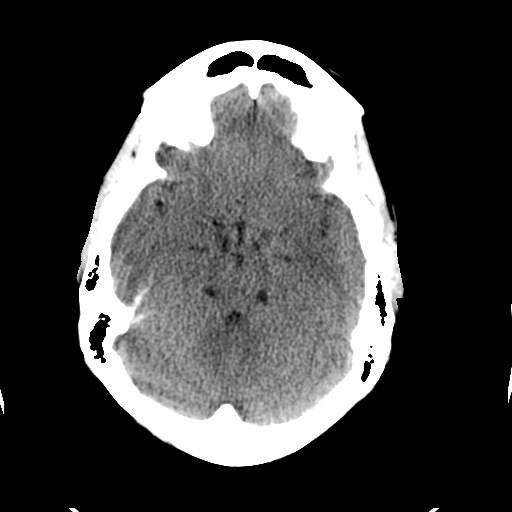
[im 13/32  brain]
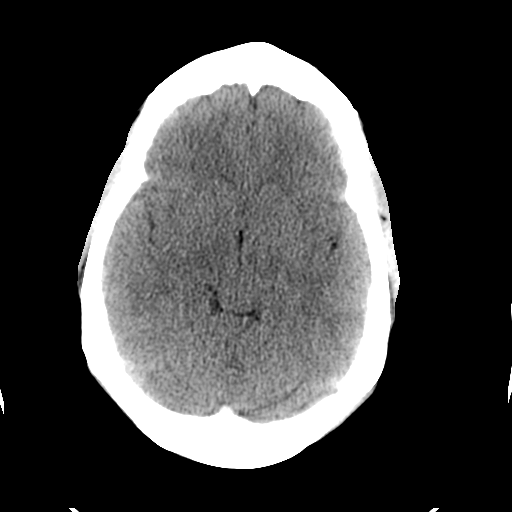
[im 15/32  brain]
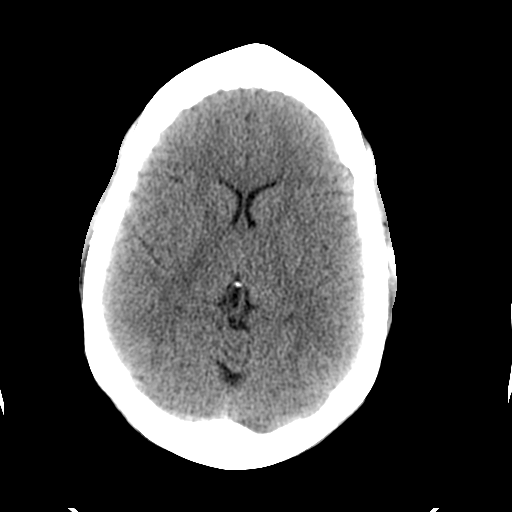
[im 17/32  brain]
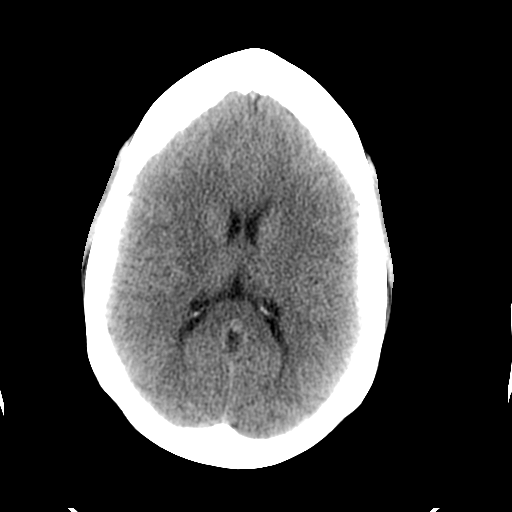
[im 17/32  bone]
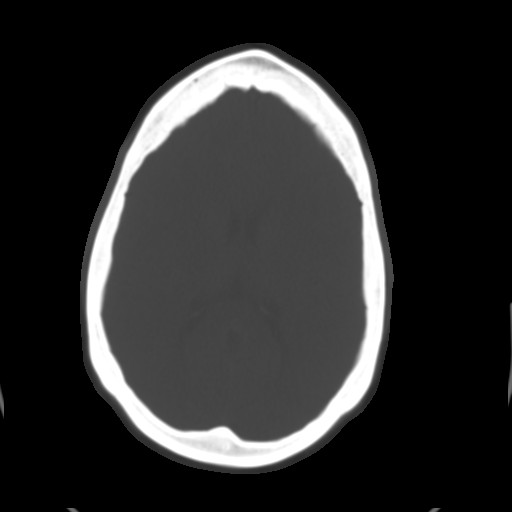
[im 19/32  brain]
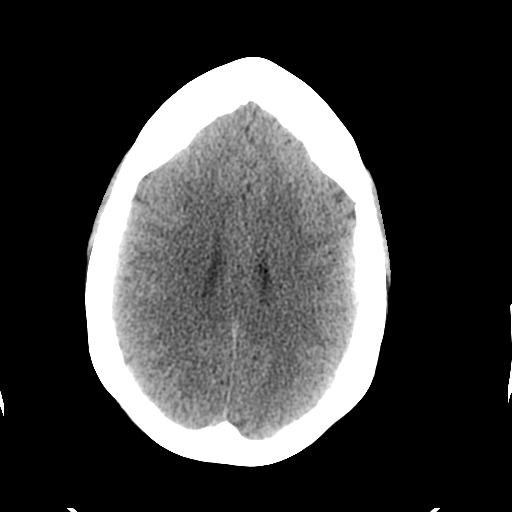
[im 21/32  brain]
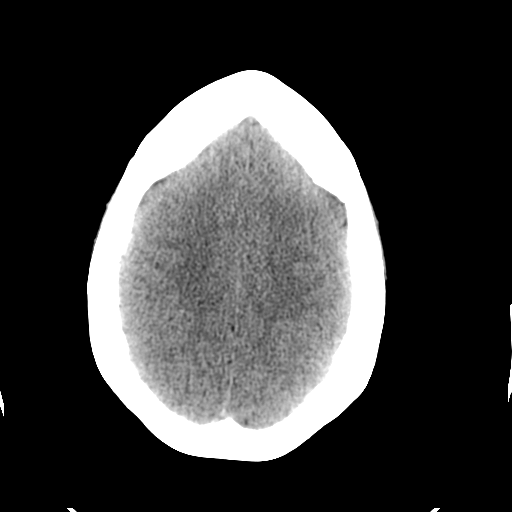
[im 23/32  brain]
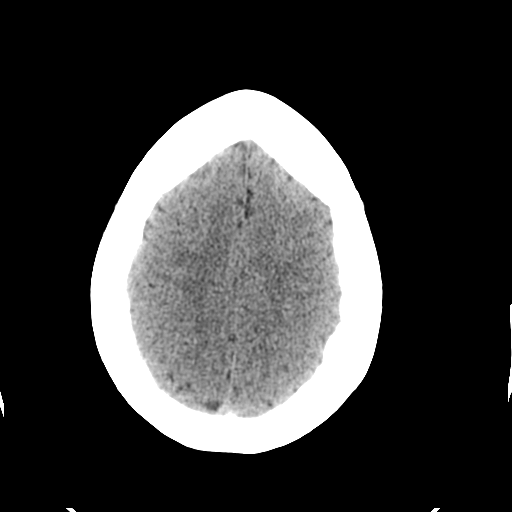
[im 24/32  brain]
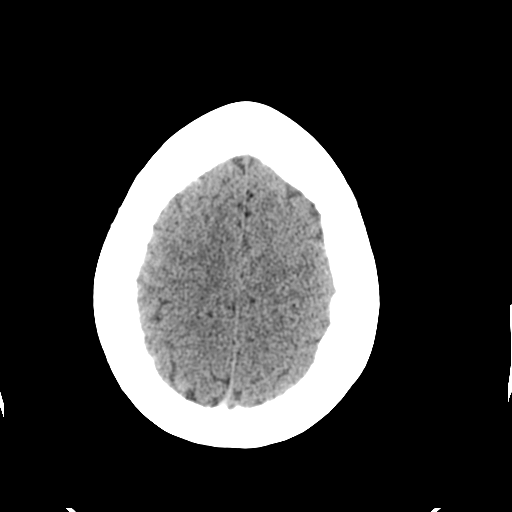
[im 24/32  bone]
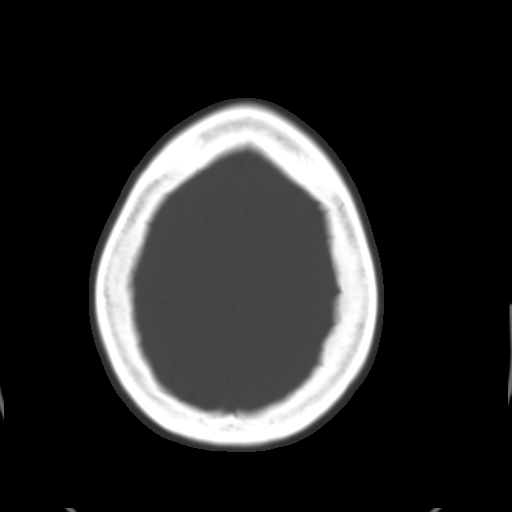
[im 26/32  brain]
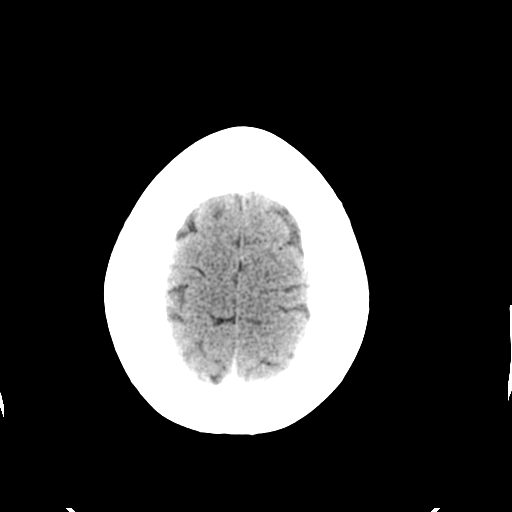
[im 28/32  brain]
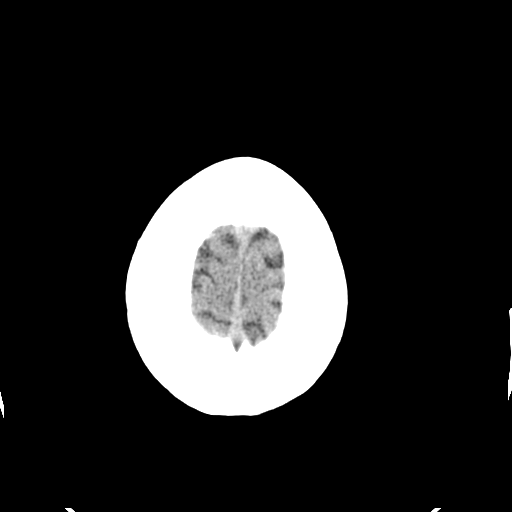
[im 30/32  brain]
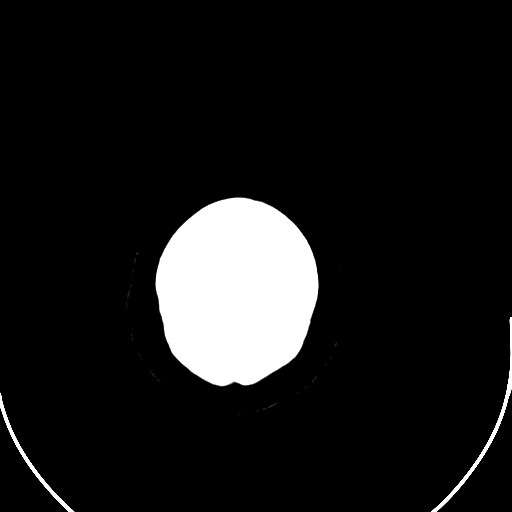

[16 of 30 positions shown; findings below may reference images not displayed]

FINDINGS: No extra-axial fluid collections or intraparenchymal
hemorrhage.  No midline shift.  No mass lesion.  No CT evidence of
acute infarction.  No hydrocephalous.  Basilar cisterns are patent.

Paranasal sinuses and mastoid air cells are clear.  Orbits appear
normal.
IMPRESSION: 1.  No acute intracranial process.

2.  No CT evidence of acute infarction.

## 2010-04-26 ENCOUNTER — Encounter: Payer: Self-pay | Admitting: Internal Medicine

## 2010-05-18 ENCOUNTER — Other Ambulatory Visit (HOSPITAL_COMMUNITY): Payer: Self-pay | Admitting: Internal Medicine

## 2010-05-18 ENCOUNTER — Ambulatory Visit (HOSPITAL_COMMUNITY)
Admission: RE | Admit: 2010-05-18 | Discharge: 2010-05-18 | Disposition: A | Discharge status: Home / Self Care | Payer: BC Managed Care – PPO | Source: Ambulatory Visit | Attending: Internal Medicine | Admitting: Internal Medicine

## 2010-05-18 DIAGNOSIS — K8020 Calculus of gallbladder without cholecystitis without obstruction: Secondary | ICD-10-CM | POA: Insufficient documentation

## 2010-05-18 DIAGNOSIS — R109 Unspecified abdominal pain: Secondary | ICD-10-CM | POA: Insufficient documentation

## 2010-05-18 DIAGNOSIS — K5900 Constipation, unspecified: Secondary | ICD-10-CM | POA: Insufficient documentation

## 2010-05-18 DIAGNOSIS — R110 Nausea: Secondary | ICD-10-CM | POA: Insufficient documentation

## 2010-05-18 DIAGNOSIS — K7689 Other specified diseases of liver: Secondary | ICD-10-CM | POA: Insufficient documentation

## 2010-05-18 DIAGNOSIS — R52 Pain, unspecified: Secondary | ICD-10-CM

## 2010-05-18 MED ORDER — IOHEXOL 300 MG/ML  SOLN: 100.0000 mL | Freq: Once | INTRAMUSCULAR | Status: AC | PRN

## 2010-05-18 MED ORDER — IOHEXOL 300 MG/ML  SOLN
100.0000 mL | Freq: Once | INTRAMUSCULAR | Status: AC | PRN
  Administered 2010-05-18: 100 mL via INTRAVENOUS

## 2010-06-21 LAB — CBC
HCT: 33.6 % — ABNORMAL LOW (ref 36.0–46.0)
Platelets: 290 10*3/uL (ref 150–400)
RDW: 12.4 % (ref 11.5–15.5)
WBC: 5.5 10*3/uL (ref 4.0–10.5)

## 2010-06-21 LAB — COMPREHENSIVE METABOLIC PANEL
ALT: 19 U/L (ref 0–35)
CO2: 25 mEq/L (ref 19–32)
Calcium: 8 mg/dL — ABNORMAL LOW (ref 8.4–10.5)
Creatinine, Ser: 0.61 mg/dL (ref 0.4–1.2)
GFR calc Af Amer: >60 mL/min (ref >60)
GFR calc non Af Amer: >60 mL/min (ref >60)
Glucose, Bld: 264 mg/dL — ABNORMAL HIGH (ref 70–99)
Sodium: 135 mEq/L (ref 135–145)
Total Protein: 7.1 g/dL (ref 6.0–8.3)

## 2010-06-21 LAB — DIFFERENTIAL
Lymphocytes Relative: 24 % (ref 12–46)
Lymphs Abs: 1.3 10*3/uL (ref 0.7–4.0)
Monocytes Relative: 8 % (ref 3–12)
Neutrophils Relative %: 67 % (ref 43–77)

## 2010-06-21 LAB — URINALYSIS, ROUTINE W REFLEX MICROSCOPIC
Glucose, UA: 500 mg/dL — AB
Hgb urine dipstick: NEGATIVE
Specific Gravity, Urine: 1.031 — ABNORMAL HIGH (ref 1.005–1.030)
pH: 6 (ref 5.0–8.0)

## 2010-06-21 LAB — POCT PREGNANCY, URINE: Preg Test, Ur: NEGATIVE

## 2010-06-21 LAB — GLUCOSE, CAPILLARY: Glucose-Capillary: 232 mg/dL — ABNORMAL HIGH (ref 70–99)

## 2010-07-21 LAB — DIFFERENTIAL
Basophils Relative: 1 % (ref 0–1)
Eosinophils Absolute: 0 10*3/uL (ref 0.0–0.7)
Eosinophils Relative: 0 % (ref 0–5)
Lymphs Abs: 3.1 10*3/uL (ref 0.7–4.0)
Monocytes Relative: 5 % (ref 3–12)

## 2010-07-21 LAB — COMPREHENSIVE METABOLIC PANEL
ALT: 15 U/L (ref 0–35)
AST: 26 U/L (ref 0–37)
Alkaline Phosphatase: 84 U/L (ref 39–117)
CO2: 27 mEq/L (ref 19–32)
Calcium: 9.3 mg/dL (ref 8.4–10.5)
GFR calc Af Amer: >60 mL/min (ref >60)
GFR calc non Af Amer: >60 mL/min (ref >60)
Potassium: 4 mEq/L (ref 3.5–5.1)
Sodium: 136 mEq/L (ref 135–145)

## 2010-07-21 LAB — CBC
Hemoglobin: 11.9 g/dL — ABNORMAL LOW (ref 12.0–15.0)
MCHC: 32.6 g/dL (ref 30.0–36.0)
RBC: 4.18 MIL/uL (ref 3.87–5.11)
WBC: 13 10*3/uL — ABNORMAL HIGH (ref 4.0–10.5)

## 2010-07-31 ENCOUNTER — Other Ambulatory Visit: Payer: Self-pay | Admitting: Internal Medicine

## 2010-07-31 DIAGNOSIS — Z1231 Encounter for screening mammogram for malignant neoplasm of breast: Secondary | ICD-10-CM

## 2010-08-06 ENCOUNTER — Ambulatory Visit
Admission: RE | Admit: 2010-08-06 | Discharge: 2010-08-06 | Disposition: A | Discharge status: Home / Self Care | Payer: BC Managed Care – PPO | Source: Ambulatory Visit | Attending: Internal Medicine | Admitting: Internal Medicine

## 2010-08-06 DIAGNOSIS — Z1231 Encounter for screening mammogram for malignant neoplasm of breast: Secondary | ICD-10-CM

## 2010-12-24 LAB — I-STAT 8, (EC8 V) (CONVERTED LAB)
Acid-Base Excess: 3 — ABNORMAL HIGH
Chloride: 104
pCO2, Ven: 54.3 — ABNORMAL HIGH
pH, Ven: 7.353 — ABNORMAL HIGH

## 2010-12-24 LAB — URINALYSIS, ROUTINE W REFLEX MICROSCOPIC
Leukocytes, UA: NEGATIVE
Protein, ur: NEGATIVE
Urobilinogen, UA: 1

## 2010-12-24 LAB — WET PREP, GENITAL: Yeast Wet Prep HPF POC: NONE SEEN

## 2010-12-24 LAB — POCT URINALYSIS DIP (DEVICE)
Operator id: 239701
Protein, ur: 30 — AB
Specific Gravity, Urine: 1.01
Urobilinogen, UA: 0.2

## 2010-12-24 LAB — URINE MICROSCOPIC-ADD ON

## 2010-12-24 LAB — URINE CULTURE: Colony Count: >=100000

## 2010-12-24 LAB — DIFFERENTIAL
Basophils Absolute: 0.1
Basophils Relative: 1
Eosinophils Absolute: 0.1
Neutrophils Relative %: 73

## 2010-12-24 LAB — POCT I-STAT CREATININE
Creatinine, Ser: 0.8
Operator id: 285841

## 2010-12-24 LAB — GC/CHLAMYDIA PROBE AMP, GENITAL: Chlamydia, DNA Probe: NEGATIVE

## 2010-12-24 LAB — CBC
MCHC: 34.1
MCV: 84.3
Platelets: 377

## 2010-12-24 LAB — POCT PREGNANCY, URINE: Preg Test, Ur: NEGATIVE

## 2011-01-15 LAB — URINE MICROSCOPIC-ADD ON

## 2011-01-15 LAB — RAPID URINE DRUG SCREEN, HOSP PERFORMED
Barbiturates: NOT DETECTED
Cocaine: NOT DETECTED
Opiates: NOT DETECTED

## 2011-01-15 LAB — CBC
MCHC: 33.2
MCV: 83.3
Platelets: 322
RDW: 11.9

## 2011-01-15 LAB — BASIC METABOLIC PANEL
BUN: 6
CO2: 26
Calcium: 8.2 — ABNORMAL LOW
Chloride: 102
Creatinine, Ser: 0.58

## 2011-01-15 LAB — URINALYSIS, ROUTINE W REFLEX MICROSCOPIC
Bilirubin Urine: NEGATIVE
Glucose, UA: NEGATIVE
Hgb urine dipstick: NEGATIVE
Specific Gravity, Urine: 1.017
pH: 6

## 2011-01-15 LAB — DIFFERENTIAL
Basophils Absolute: 0.1
Basophils Relative: 2 — ABNORMAL HIGH
Eosinophils Absolute: 0.1
Neutro Abs: 5.2
Neutrophils Relative %: 69

## 2011-01-15 LAB — URINE CULTURE

## 2011-04-06 HISTORY — PX: OTHER SURGICAL HISTORY: SHX169

## 2011-04-15 IMAGING — CT CT ABD-PELV W/ CM
2 of 4 series · 17 of 46 positions shown, 19 images · IV contrast (agent unspecified)
Comparison: None.

CLINICAL DATA: Abdominal pain.  Nausea constipation.

CT ABDOMEN AND PELVIS WITH CONTRAST
TECHNIQUE: Multidetector CT imaging of the abdomen and pelvis was
performed using the standard protocol following bolus
administration of intravenous contrast.
Contrast: 100 ml Imnipaque-4BB

[Series 2: rtn a/p with · axial · 0.74mm/px · z∈[-506,-40]mm · 14 of 103 slices shown, 16 images]
[im 5/103  soft-tissue]
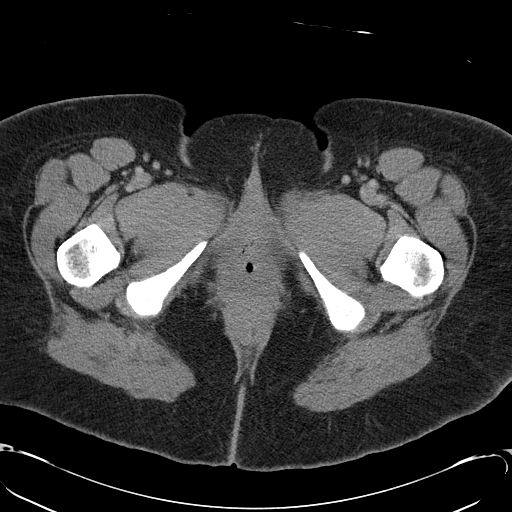
[im 5/103  bone]
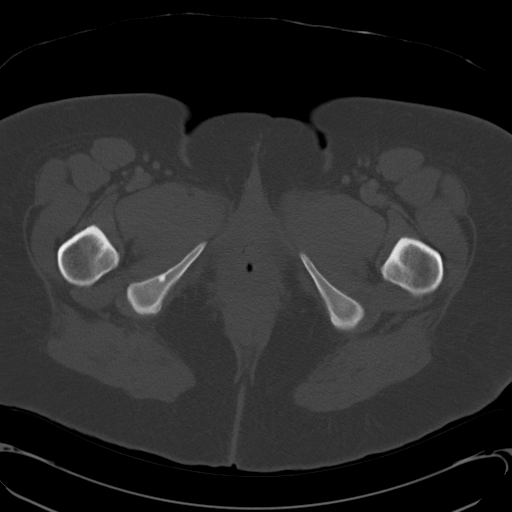
[im 13/103  soft-tissue]
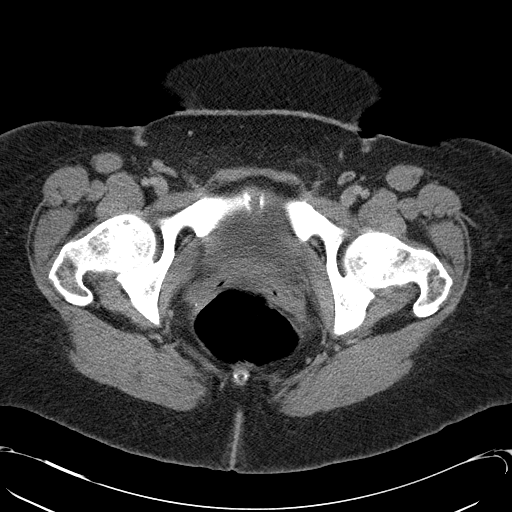
[im 22/103  soft-tissue]
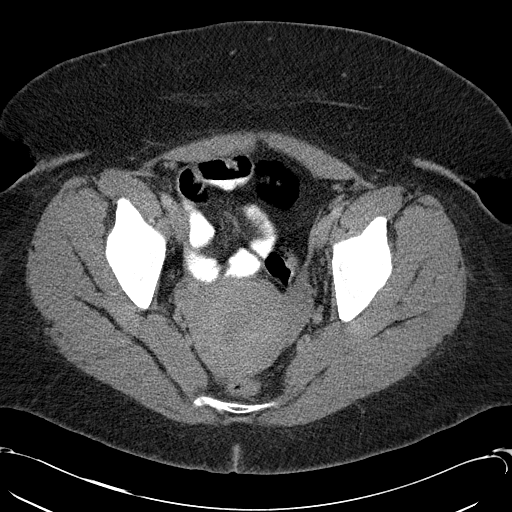
[im 26/103  soft-tissue]
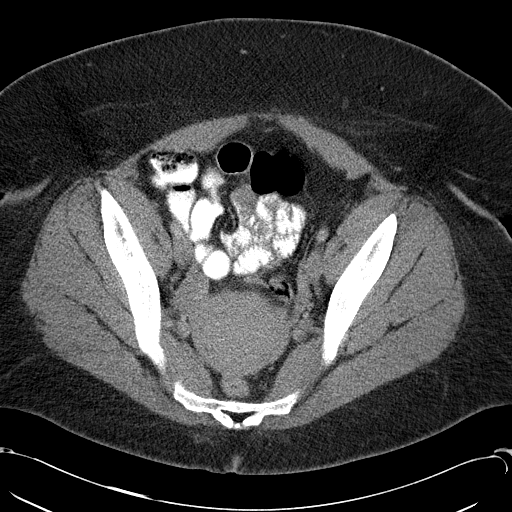
[im 35/103  soft-tissue]
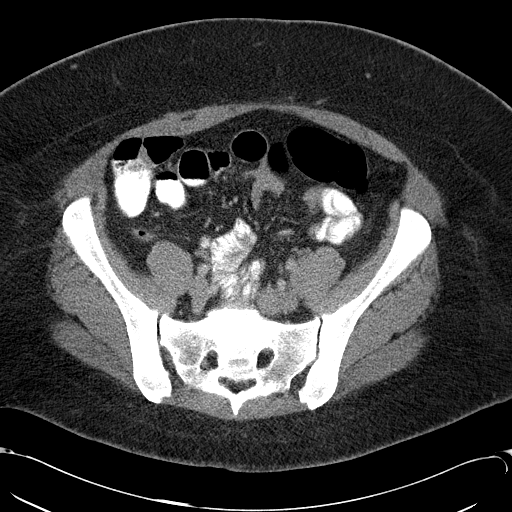
[im 43/103  soft-tissue]
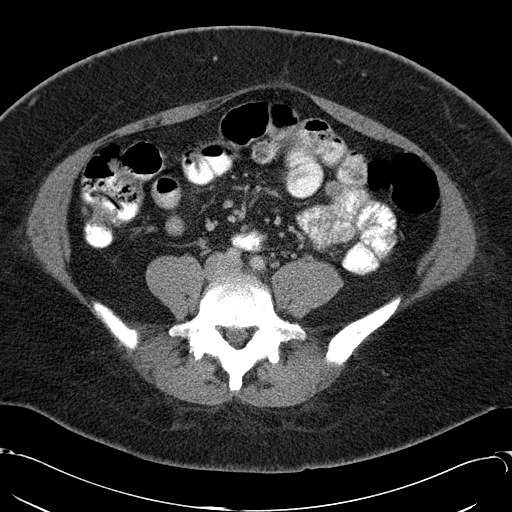
[im 47/103  soft-tissue]
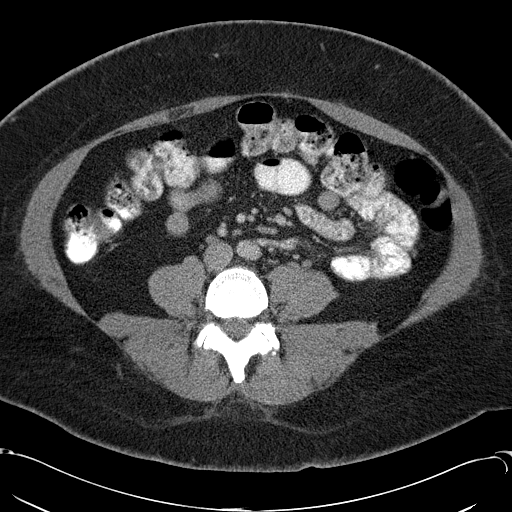
[im 56/103  soft-tissue]
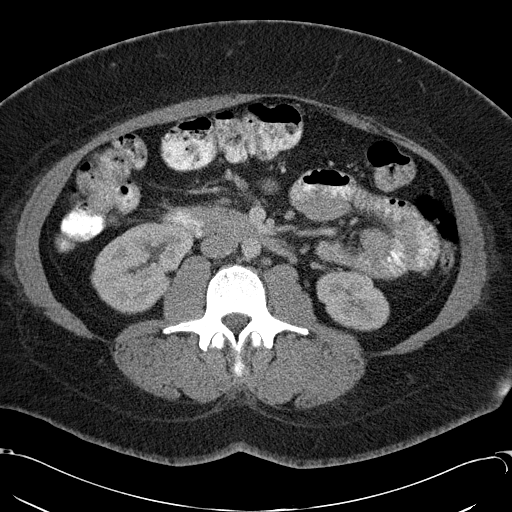
[im 60/103  soft-tissue]
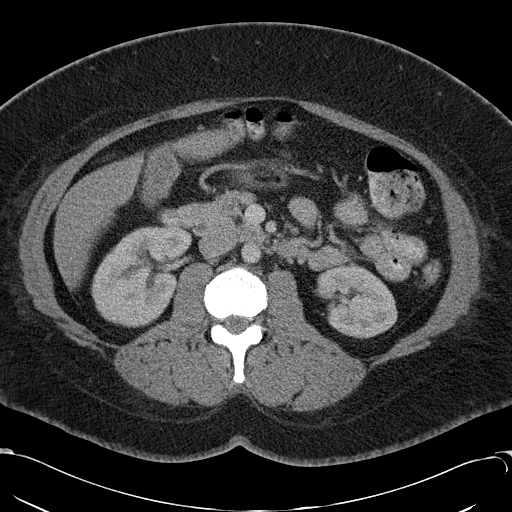
[im 60/103  bone]
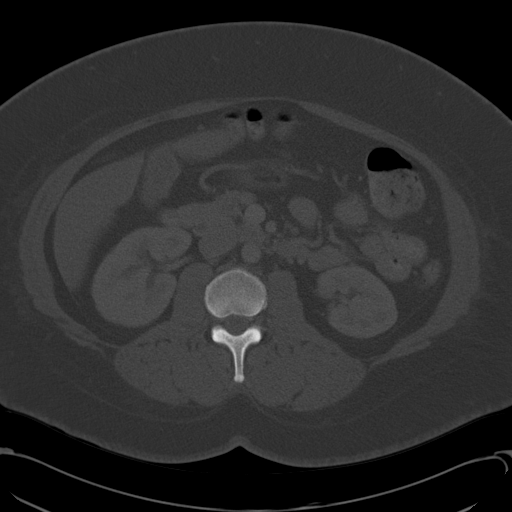
[im 69/103  soft-tissue]
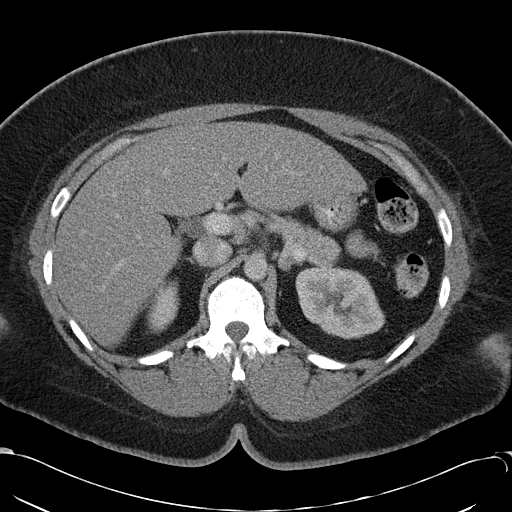
[im 77/103  soft-tissue]
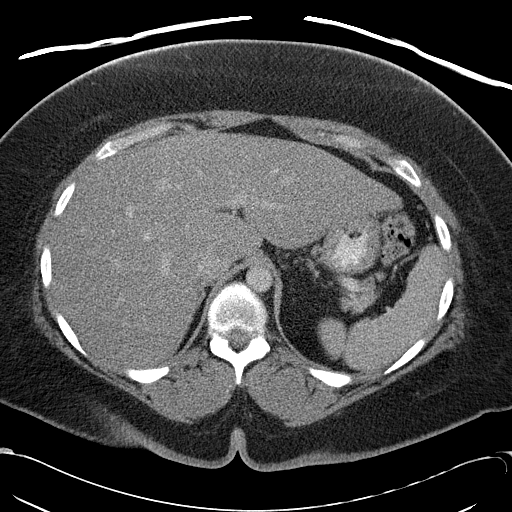
[im 81/103  soft-tissue]
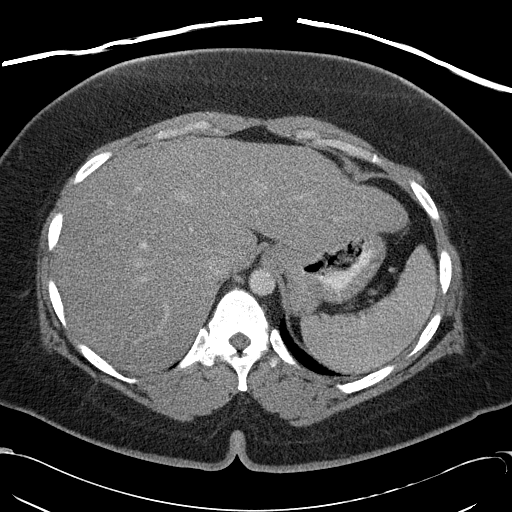
[im 90/103  soft-tissue]
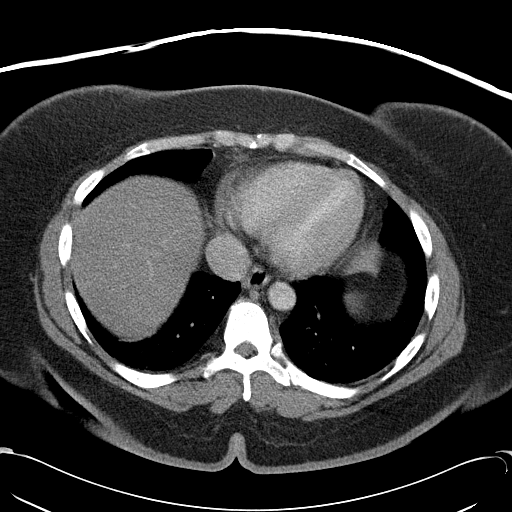
[im 98/103  soft-tissue]
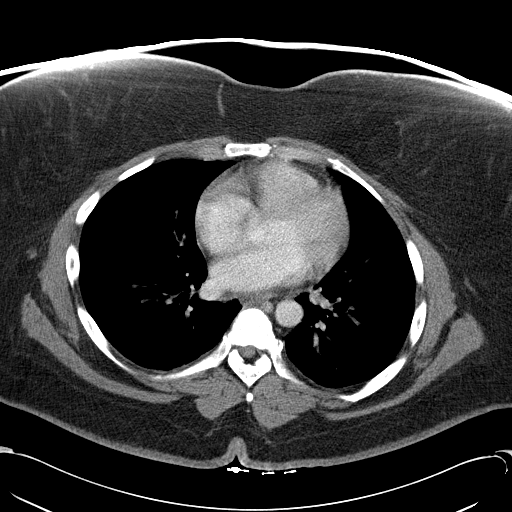

[Series 602: <mpr thick range> · coronal · 1.00mm/px · 3 of 92 slices shown]
[im 31/92  soft-tissue]
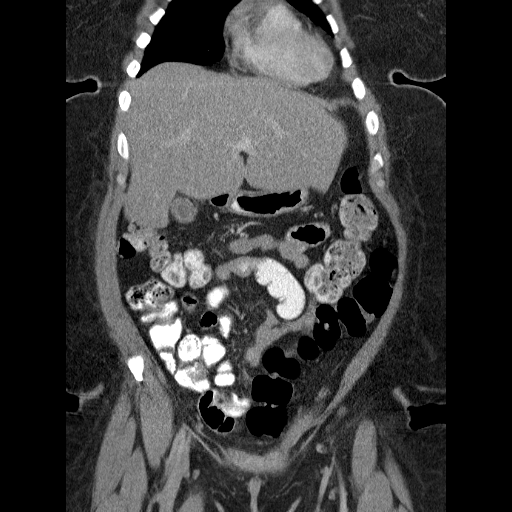
[im 41/92  soft-tissue]
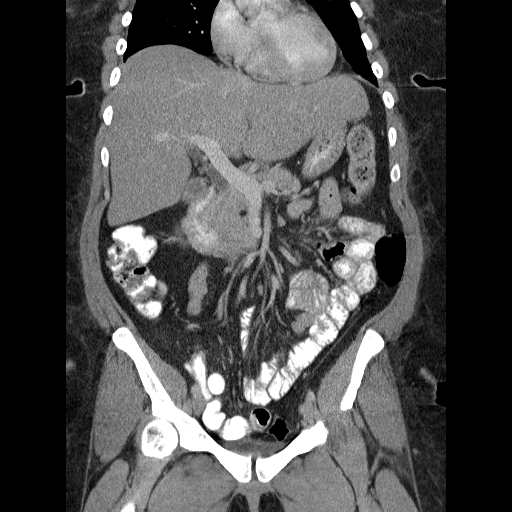
[im 51/92  soft-tissue]
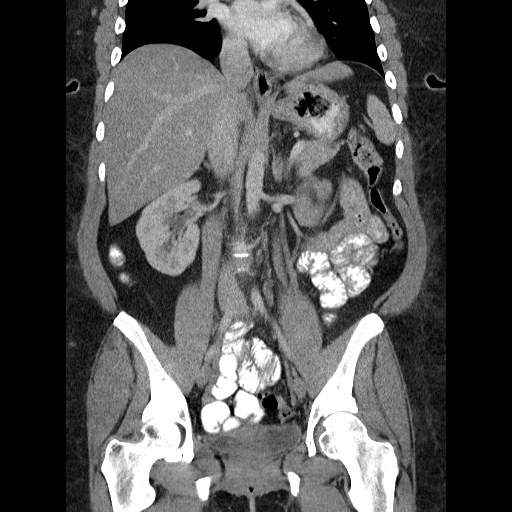

[17 of 46 positions shown; findings below may reference images not displayed]

FINDINGS: The liver shows diffuse fatty infiltration and measures
19 cm in cranial caudal length, enlarged.  No focal abnormality is
visible within the hepatic or splenic parenchyma.  The stomach,
duodenum, pancreas, and adrenal glands are unremarkable.
Gallbladder is  contracted around multiple stones, measuring up to
about 2 cm in diameter.  No intra or extrahepatic biliary duct
dilatation.  There is no focal abnormality in either kidney.  No
hydronephrosis.  No renal stone.

No abdominal aortic aneurysm.  No free fluid in the abdomen.  No
abdominal lymphadenopathy.

Imaging through the pelvis shows no intraperitoneal free fluid.
The bladder is not distended.  Uterus has normal imaging features.
There is no adnexal mass.  No evidence for colonic diverticulitis.
The terminal ileum is normal.  The appendix is normal.

Bone windows reveal no worrisome lytic or sclerotic osseous
lesions.
IMPRESSION: No acute findings in the abdomen or pelvis.  No CT evidence to
explain the patient's history of right lower quadrant pain, nausea,
and constipation.

Cholelithiasis.

Fatty enlarged liver.

## 2011-04-25 ENCOUNTER — Encounter (HOSPITAL_COMMUNITY): Payer: Self-pay

## 2011-04-25 ENCOUNTER — Emergency Department (HOSPITAL_COMMUNITY): Payer: BC Managed Care – PPO

## 2011-04-25 ENCOUNTER — Other Ambulatory Visit: Payer: Self-pay

## 2011-04-25 ENCOUNTER — Emergency Department (HOSPITAL_COMMUNITY)
Admission: EM | Admit: 2011-04-25 | Discharge: 2011-04-25 | Disposition: A | Discharge status: Home / Self Care | Payer: BC Managed Care – PPO | Attending: Emergency Medicine | Admitting: Emergency Medicine

## 2011-04-25 DIAGNOSIS — R209 Unspecified disturbances of skin sensation: Secondary | ICD-10-CM | POA: Insufficient documentation

## 2011-04-25 DIAGNOSIS — Z794 Long term (current) use of insulin: Secondary | ICD-10-CM | POA: Insufficient documentation

## 2011-04-25 DIAGNOSIS — R51 Headache: Secondary | ICD-10-CM | POA: Insufficient documentation

## 2011-04-25 DIAGNOSIS — Z79899 Other long term (current) drug therapy: Secondary | ICD-10-CM | POA: Insufficient documentation

## 2011-04-25 DIAGNOSIS — I10 Essential (primary) hypertension: Secondary | ICD-10-CM | POA: Insufficient documentation

## 2011-04-25 DIAGNOSIS — E119 Type 2 diabetes mellitus without complications: Secondary | ICD-10-CM | POA: Insufficient documentation

## 2011-04-25 DIAGNOSIS — E7800 Pure hypercholesterolemia, unspecified: Secondary | ICD-10-CM | POA: Insufficient documentation

## 2011-04-25 HISTORY — DX: Pure hypercholesterolemia, unspecified: E78.00

## 2011-04-25 HISTORY — DX: Essential (primary) hypertension: I10

## 2011-04-25 LAB — BASIC METABOLIC PANEL
Chloride: 102 mEq/L (ref 96–112)
Creatinine, Ser: 0.54 mg/dL (ref 0.50–1.10)
GFR calc Af Amer: >90 mL/min (ref >90)
Sodium: 137 mEq/L (ref 135–145)

## 2011-04-25 LAB — DIFFERENTIAL
Basophils Absolute: 0 10*3/uL (ref 0.0–0.1)
Basophils Relative: 0 % (ref 0–1)
Monocytes Absolute: 0.3 10*3/uL (ref 0.1–1.0)
Neutro Abs: 3.2 10*3/uL (ref 1.7–7.7)
Neutrophils Relative %: 51 % (ref 43–77)

## 2011-04-25 LAB — POCT I-STAT TROPONIN I: Troponin i, poc: 0 ng/mL (ref 0.00–0.08)

## 2011-04-25 LAB — CBC
HCT: 33.6 % — ABNORMAL LOW (ref 36.0–46.0)
MCHC: 33.6 g/dL (ref 30.0–36.0)
Platelets: 323 10*3/uL (ref 150–400)
RDW: 11.6 % (ref 11.5–15.5)

## 2011-04-25 LAB — GLUCOSE, CAPILLARY: Glucose-Capillary: 184 mg/dL — ABNORMAL HIGH (ref 70–99)

## 2011-04-25 MED ORDER — METOCLOPRAMIDE HCL 5 MG/ML IJ SOLN
5.0000 mg | Freq: Once | INTRAMUSCULAR | Status: AC
  Administered 2011-04-25: 5 mg via INTRAVENOUS
  Filled 2011-04-25: qty 2

## 2011-04-25 MED ORDER — SODIUM CHLORIDE 0.9 % IV SOLN
Freq: Once | INTRAVENOUS | Status: AC
  Administered 2011-04-25: 11:00:00 via INTRAVENOUS

## 2011-04-25 MED ORDER — DIPHENHYDRAMINE HCL 12.5 MG/5ML PO ELIX
25.0000 mg | ORAL_SOLUTION | Freq: Once | ORAL | Status: AC
  Administered 2011-04-25: 25 mg via ORAL
  Filled 2011-04-25: qty 5

## 2011-04-25 MED ORDER — LORAZEPAM 2 MG/ML IJ SOLN
1.0000 mg | Freq: Once | INTRAMUSCULAR | Status: AC
  Administered 2011-04-25: 1 mg via INTRAVENOUS
  Filled 2011-04-25: qty 1

## 2011-04-25 NOTE — ED Provider Notes (Signed)
History     CSN: [id number]  Arrival date & time 04/25/11  4098   First MD Initiated Contact with Patient 04/25/11 1032      Chief Complaint  Patient presents with  . Headache  . Extremity Weakness    left arm numbness    (Consider location/radiation/quality/duration/timing/severity/associated sxs/prior treatment) Patient is a 46 y.o. female presenting with headaches and extremity weakness. The history is provided by the patient.  Headache   Extremity Weakness Associated symptoms include headaches.   patient here with headache which began this morning and start in her frontal region. Pain didn't radiate to her eyes. No vomiting, fever, neck pain. No photophobia. Pain described as sharp in nature , patient took Motrin that did not relieve her symptoms. Denies any temporal pain. No prior history of same.  She also notes some left upper extremity pain that started after the headache. Denies any weakness to her left arm. No lower any weakness noted. Nothing makes her left arm pain better or worse. She denies any associated chest pain, dyspnea, diaphoresis, exertional component.  Past Medical History  Diagnosis Date  . Diabetes mellitus   . Hypertension   . Hypercholesteremia     History reviewed. No pertinent past surgical history.  No family history on file.  History  Substance Use Topics  . Smoking status: Never Smoker   . Smokeless tobacco: Not on file  . Alcohol Use: No    OB History    Grav Para Term Preterm Abortions TAB SAB Ect Mult Living                  Review of Systems  Musculoskeletal: Positive for extremity weakness.  Neurological: Positive for headaches.  All other systems reviewed and are negative.    Allergies  Review of patient's allergies indicates no known allergies.  Home Medications   Current Outpatient Rx  Name Route Sig Dispense Refill  . IBUPROFEN 200 MG PO TABS Oral Take 200 mg by mouth every 6 (six) hours as needed. pain    .  INSULIN DETEMIR 100 UNIT/ML [place] SOLN Subcutaneous Inject 50 Units into the skin 2 (two) times daily.    [name] LOSARTAN POTASSIUM-HCTZ 100-12.5 MG PO TABS Oral Take 1 tablet by mouth daily.    [name] SITAGLIPTIN-METFORMIN HCL 50-1000 MG PO TABS Oral Take 1 tablet by mouth 2 (two) times daily with a meal.      BP 134/67  Pulse 76  Temp(Src) 97.8 F (36.6 C) (Oral)  Resp 18  SpO2 100%  LMP 04/06/2011  Physical Exam  Nursing note and vitals reviewed. Constitutional: She is oriented to person, place, and time. She appears well-developed and well-nourished.  Non-toxic appearance. No distress.  HENT:  Head: Normocephalic and atraumatic.  Eyes: Conjunctivae, EOM and lids are normal. Pupils are equal, round, and reactive to light.  Neck: Normal range of motion. Neck supple. No tracheal deviation present. No mass present.  Cardiovascular: Normal rate, regular rhythm and normal heart sounds.  Exam reveals no gallop.   No murmur heard. Pulmonary/Chest: Effort normal and breath sounds normal. No stridor. No respiratory distress. She has no decreased breath sounds. She has no wheezes. She has no rhonchi. She has no rales.  Abdominal: Soft. Normal appearance and bowel sounds are normal. She exhibits no distension. There is no tenderness. There is no rebound and no CVA tenderness.  Musculoskeletal: Normal range of motion. She exhibits no edema and no tenderness.  Neurological: She is alert and oriented to  person, place, and time. She has normal strength. No cranial nerve deficit or sensory deficit. GCS eye subscore is 4. GCS verbal subscore is 5. GCS motor subscore is 6.  Skin: Skin is warm and dry. No abrasion and no rash noted.  Psychiatric: She has a normal mood and affect. Her speech is normal and behavior is normal.    ED Course  Procedures (including critical care time)  Labs Reviewed  GLUCOSE, CAPILLARY - Abnormal; Notable for the following:    Glucose-Capillary 184 (*)    All other components  within normal limits  POCT CBG MONITORING  CBC  DIFFERENTIAL  BASIC METABOLIC PANEL  I-STAT TROPONIN I   No results found.   No diagnosis found.    MDM  She given medications here feels better. Patient reexamined prior to discharge in stable.no neurological deficits. Patient to be discharged        Toy Baker, MD 04/25/11 1340

## 2011-04-25 NOTE — ED Notes (Signed)
Pt in from home with c/o headache and left arm numbness and weakness states onset of sx at 0530 denies n/v states some dizziness

## 2011-10-20 ENCOUNTER — Other Ambulatory Visit: Payer: Self-pay | Admitting: Internal Medicine

## 2011-10-20 DIAGNOSIS — Z1231 Encounter for screening mammogram for malignant neoplasm of breast: Secondary | ICD-10-CM

## 2011-11-01 ENCOUNTER — Ambulatory Visit
Admission: RE | Admit: 2011-11-01 | Discharge: 2011-11-01 | Disposition: A | Discharge status: Home / Self Care | Payer: BC Managed Care – PPO | Source: Ambulatory Visit | Attending: Internal Medicine | Admitting: Internal Medicine

## 2011-11-01 DIAGNOSIS — Z1231 Encounter for screening mammogram for malignant neoplasm of breast: Secondary | ICD-10-CM

## 2011-11-24 ENCOUNTER — Encounter (HOSPITAL_COMMUNITY): Payer: Self-pay | Admitting: *Deleted

## 2011-11-24 ENCOUNTER — Emergency Department (HOSPITAL_COMMUNITY)
Admission: EM | Admit: 2011-11-24 | Discharge: 2011-11-24 | Disposition: A | Discharge status: Home / Self Care | Payer: BC Managed Care – PPO | Attending: Emergency Medicine | Admitting: Emergency Medicine

## 2011-11-24 DIAGNOSIS — IMO0002 Reserved for concepts with insufficient information to code with codable children: Secondary | ICD-10-CM | POA: Insufficient documentation

## 2011-11-24 DIAGNOSIS — E7800 Pure hypercholesterolemia, unspecified: Secondary | ICD-10-CM | POA: Insufficient documentation

## 2011-11-24 DIAGNOSIS — Z794 Long term (current) use of insulin: Secondary | ICD-10-CM | POA: Insufficient documentation

## 2011-11-24 DIAGNOSIS — I10 Essential (primary) hypertension: Secondary | ICD-10-CM | POA: Insufficient documentation

## 2011-11-24 DIAGNOSIS — L0390 Cellulitis, unspecified: Secondary | ICD-10-CM

## 2011-11-24 DIAGNOSIS — E119 Type 2 diabetes mellitus without complications: Secondary | ICD-10-CM | POA: Insufficient documentation

## 2011-11-24 MED ORDER — OXYCODONE-ACETAMINOPHEN 5-325 MG PO TABS
2.0000 | ORAL_TABLET | Freq: Once | ORAL | Status: AC
  Administered 2011-11-24: 2 via ORAL
  Filled 2011-11-24: qty 2

## 2011-11-24 MED ORDER — DOXYCYCLINE HYCLATE 100 MG PO CAPS: 100.0000 mg | ORAL_CAPSULE | Freq: Two times a day (BID) | ORAL | Status: AC

## 2011-11-24 MED ORDER — OXYCODONE-ACETAMINOPHEN 5-325 MG PO TABS: 1.0000 | ORAL_TABLET | Freq: Four times a day (QID) | ORAL | Status: AC | PRN

## 2011-11-24 NOTE — ED Notes (Signed)
Pt c/o possible insect bite back of right upper arm; noticed Monday morning; states drained Monday and since then increased pain right upper arm

## 2011-11-24 NOTE — ED Provider Notes (Signed)
History     CSN: [id number]  Arrival date & time 11/24/11  0101   First MD Initiated Contact with Patient 11/24/11 0128      Chief Complaint  Patient presents with  . Insect Bite    (Consider location/radiation/quality/duration/timing/severity/associated sxs/prior treatment) HPI Comments: Patient presenting today with a chief complaint of a tender area on the right upper arm for the past 2 days.  She states that there was a small abscess that popped and drained a small amount of purulent fluid.  The area around the abscess is now becoming increasingly tender, erythematous, and warm to the touch.  No prior history of skin abscesses.  She is diabetic.    The history is provided by the patient.    Past Medical History  Diagnosis Date  . Diabetes mellitus   . Hypertension   . Hypercholesteremia     History reviewed. No pertinent past surgical history.  No family history on file.  History  Substance Use Topics  . Smoking status: Never Smoker   . Smokeless tobacco: Not on file  . Alcohol Use: No    OB History    Grav Para Term Preterm Abortions TAB SAB Ect Mult Living                  Review of Systems  Constitutional: Negative for fever and chills.  Gastrointestinal: Negative for nausea and vomiting.  Skin: Positive for color change.  All other systems reviewed and are negative.    Allergies  Review of patient's allergies indicates no known allergies.  Home Medications   Current Outpatient Rx  Name Route Sig Dispense Refill  . INSULIN DETEMIR 100 UNIT/ML [place] SOLN Subcutaneous Inject 50 Units into the skin 2 (two) times daily.    [name] LOSARTAN POTASSIUM-HCTZ 100-12.5 MG PO TABS Oral Take 1 tablet by mouth daily.    [name] SITAGLIPTIN-METFORMIN HCL 50-1000 MG PO TABS Oral Take 1 tablet by mouth 2 (two) times daily with a meal.    . IBUPROFEN 200 MG PO TABS Oral Take 200 mg by mouth every 6 (six) hours as needed. pain      BP 127/81  Pulse 106  Temp 97.5 F (36.4 C)  (Oral)  Resp 22  Ht 5\' 9"  (1.753 m)  Wt 296 lb (134.265 kg)  BMI 43.71 kg/m2  SpO2 98%  Physical Exam  Nursing note and vitals reviewed. Constitutional: She appears well-developed and well-nourished. No distress.  HENT:  Head: Normocephalic and atraumatic.  Cardiovascular: Normal rate, regular rhythm and normal heart sounds.   Pulses:      Radial pulses are 2+ on the right side, and 2+ on the left side.  Pulmonary/Chest: Effort normal and breath sounds normal.  Musculoskeletal: Normal range of motion.  Neurological: She is alert. No sensory deficit. Gait normal.  Skin: She is not diaphoretic.     Psychiatric: She has a normal mood and affect.    ED Course  Procedures (including critical care time)  Labs Reviewed - No data to display No results found.   No diagnosis found.    MDM  Patient with cellulitis of the right upper arm.  Bed side ultrasound used for further evaluation and no definable fluid collection visualized.  Patient afebrile and without systemic symptoms.  Patient discharged home with antibiotics and instructed to follow up with PCP.  Return precautions discussed.        Pascal Lux Bowers, PA-C 11/24/11 2353

## 2011-11-26 NOTE — ED Provider Notes (Signed)
Medical screening examination/treatment/procedure(s) were performed by non-physician practitioner and as supervising physician I was immediately available for consultation/collaboration.  Henli Hey T Nikea Settle, MD 11/26/11 0742 

## 2011-11-29 ENCOUNTER — Emergency Department (HOSPITAL_COMMUNITY)
Admission: EM | Admit: 2011-11-29 | Discharge: 2011-11-29 | Disposition: A | Discharge status: Home / Self Care | Payer: BC Managed Care – PPO | Attending: Emergency Medicine | Admitting: Emergency Medicine

## 2011-11-29 ENCOUNTER — Encounter (HOSPITAL_COMMUNITY): Payer: Self-pay | Admitting: Emergency Medicine

## 2011-11-29 DIAGNOSIS — Z794 Long term (current) use of insulin: Secondary | ICD-10-CM | POA: Insufficient documentation

## 2011-11-29 DIAGNOSIS — L02419 Cutaneous abscess of limb, unspecified: Secondary | ICD-10-CM

## 2011-11-29 DIAGNOSIS — IMO0002 Reserved for concepts with insufficient information to code with codable children: Secondary | ICD-10-CM | POA: Insufficient documentation

## 2011-11-29 DIAGNOSIS — E7800 Pure hypercholesterolemia, unspecified: Secondary | ICD-10-CM | POA: Insufficient documentation

## 2011-11-29 DIAGNOSIS — E119 Type 2 diabetes mellitus without complications: Secondary | ICD-10-CM | POA: Insufficient documentation

## 2011-11-29 DIAGNOSIS — Z79899 Other long term (current) drug therapy: Secondary | ICD-10-CM | POA: Insufficient documentation

## 2011-11-29 DIAGNOSIS — I10 Essential (primary) hypertension: Secondary | ICD-10-CM | POA: Insufficient documentation

## 2011-11-29 MED ORDER — HYDROCODONE-ACETAMINOPHEN 5-325 MG PO TABS
2.0000 | ORAL_TABLET | Freq: Once | ORAL | Status: AC
  Administered 2011-11-29: 2 via ORAL
  Filled 2011-11-29: qty 2

## 2011-11-29 MED ORDER — HYDROCODONE-ACETAMINOPHEN 5-500 MG PO TABS: 1.0000 | ORAL_TABLET | Freq: Four times a day (QID) | ORAL | Status: AC | PRN

## 2011-11-29 NOTE — ED Provider Notes (Signed)
History     CSN: [id number]  Arrival date & time 11/29/11  1702   First MD Initiated Contact with Patient 11/29/11 1709      Chief Complaint  Patient presents with  . Abscess    (Consider location/radiation/quality/duration/timing/severity/associated sxs/prior treatment) HPI Comments: 46 y/o female presents with worsening abscess on right arm since being seen at [facility] on Tuesday night. Abscess was not drained at that time. She was put on doxycycline. Only took 3 days of doxy until she saw her PCP who switched her to Bactrim. Abscess still worsening and very painful. States redness surrounding it has subsided. Denies any fever or chills. No active drainage.  The history is provided by the patient.    Past Medical History  Diagnosis Date  . Diabetes mellitus   . Hypertension   . Hypercholesteremia     Past Surgical History  Procedure Date  . Cesarean section     No family history on file.  History  Substance Use Topics  . Smoking status: Never Smoker   . Smokeless tobacco: Not on file  . Alcohol Use: No    OB History    Grav Para Term Preterm Abortions TAB SAB Ect Mult Living                  Review of Systems  Constitutional: Negative for fever and chills.  Skin: Negative for color change.       Positive for enlarging abscess    Allergies  Review of patient's allergies indicates no known allergies.  Home Medications   Current Outpatient Rx  Name Route Sig Dispense Refill  . DOXYCYCLINE HYCLATE 100 MG PO CAPS Oral Take 1 capsule (100 mg total) by mouth 2 (two) times daily. 20 capsule 0  . IBUPROFEN 200 MG PO TABS Oral Take 200 mg by mouth every 6 (six) hours as needed. pain    . INSULIN DETEMIR 100 UNIT/ML [place] SOLN Subcutaneous Inject 50 Units into the skin 2 (two) times daily.    [name] LOSARTAN POTASSIUM-HCTZ 100-12.5 MG PO TABS Oral Take 1 tablet by mouth daily.    [name] SITAGLIPTIN-METFORMIN HCL 50-1000 MG PO TABS Oral Take 1 tablet by mouth 2 (two) times  daily with a meal.    . SULFAMETHOXAZOLE-TMP DS 800-160 MG PO TABS Oral Take 1 tablet by mouth 2 (two) times daily.    . OXYCODONE-ACETAMINOPHEN 5-325 MG PO TABS Oral Take 1-2 tablets by mouth every 6 (six) hours as needed for pain. 15 tablet 0    BP 149/98  Pulse 112  Temp 98.4 F (36.9 C) (Oral)  Resp 16  SpO2 98%  Physical Exam  Constitutional: She is oriented to person, place, and time. She appears well-developed and well-nourished. No distress.  HENT:  Head: Normocephalic and atraumatic.  Eyes: Conjunctivae and EOM are normal.  Neck: Neck supple.  Cardiovascular: Normal rate, regular rhythm and normal heart sounds.   Pulmonary/Chest: Effort normal and breath sounds normal.  Lymphadenopathy:    She has no axillary adenopathy.  Neurological: She is alert and oriented to person, place, and time.  Skin: Skin is warm and dry. She is not diaphoretic.       4 in x 1.5 in indurated abscess on upper right arm. No surrounding erythema or edema. No active drainage. Very tender to palpation.  Psychiatric: She has a normal mood and affect. Her speech is normal and behavior is normal.    ED Course  Procedures (including critical care time)  INCISION AND DRAINAGE Performed by: Johnnette Gourd Consent: Verbal consent obtained. Risks and benefits: risks, benefits and alternatives were discussed Type: abscess  Body area: right upper arm  Anesthesia: local infiltration  Local anesthetic: lidocaine 2% without epinephrine  Anesthetic total: 20 ml  Complexity: complex Blunt dissection to break up loculations  Drainage: purulent  Drainage amount: large  Packing material: 1/2 in iodoform gauze. 3 small incisions made. 3 areas of packing  Patient tolerance: Patient tolerated the procedure well with no immediate complications.     Labs Reviewed  WOUND CULTURE   No results found.   1. Abscess of arm       MDM  46 y/o female with worsening abscess since Tuesday. Large  amount of purulent drainage through 3 separate incisions. No surrounding cellulitis. Patient is afebrile. Patient also evaluated by Dr. Anitra Lauth. Advised her to continue doxycycline rather than bactrim. Pain medication prescribed. Discussed f/u in 2 days for re-eval and packing removal. She has appt with PCP in 2 days.         Trevor Mace, PA-C 11/29/11 1818

## 2011-11-29 NOTE — ED Provider Notes (Signed)
Medical screening examination/treatment/procedure(s) were conducted as a shared visit with non-physician practitioner(s) and myself.  I personally evaluated the patient during the encounter Patient with a large abscess on her right tricep area. There is no surrounding cellulitis. Patient is on doxycycline. Abscess was drained and packed. Patient will return in 2 days for recheck  Gwyneth Sprout, MD 11/29/11 2152

## 2011-11-29 NOTE — ED Notes (Signed)
Reports noticed abscess to R upper arm last Monday, went to [facility] last week, they did not drain, but was placed on abx, has been taking abx, but went [facility] and was told she is on wrong abx and that is why the abscess is not healing

## 2011-12-02 LAB — WOUND CULTURE

## 2011-12-03 NOTE — ED Notes (Signed)
+  Wound. Patient treated with Doxycycline. Sensitive to same. Per protocol MD. °

## 2012-03-22 IMAGING — CT CT HEAD W/O CM
2 series · 16 of 30 positions shown, 20 images · non-contrast
Comparison: MRI 05/20/2008 at [HOSPITAL]

CLINICAL DATA: Headache, extremity weakness

CT HEAD WITHOUT CONTRAST
TECHNIQUE: Contiguous axial images were obtained from the base of
the skull through the vertex without contrast.

[Series 2: head w/o · axial · non-contrast · 0.43mm/px · z∈[-147,-27]mm · 13 of 28 slices shown, 17 images]
[im 2/28  brain]
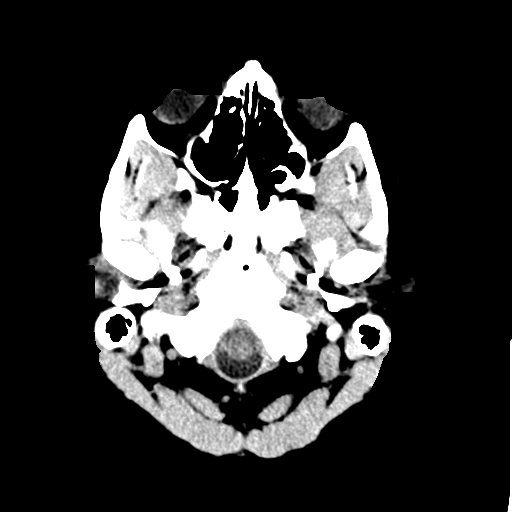
[im 2/28  bone]
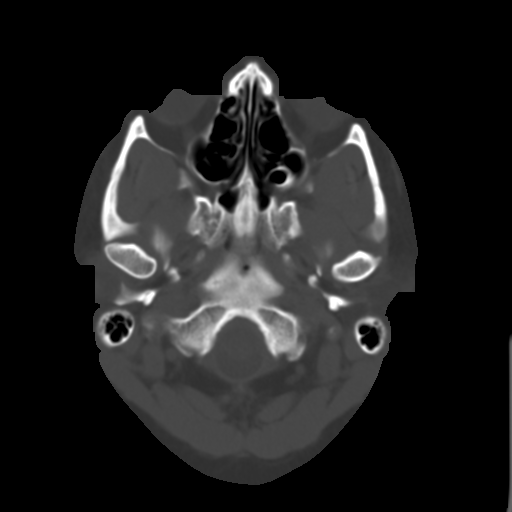
[im 4/28  brain]
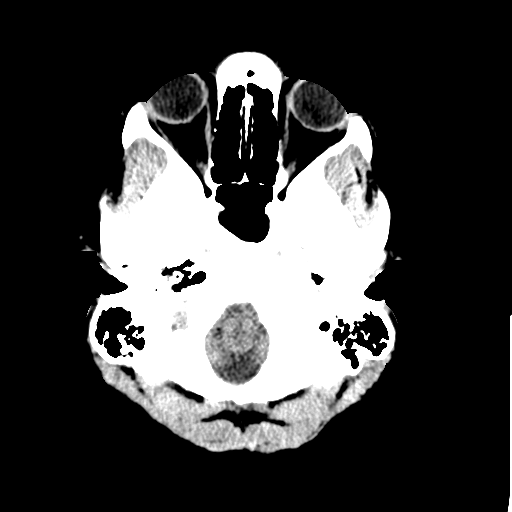
[im 6/28  brain]
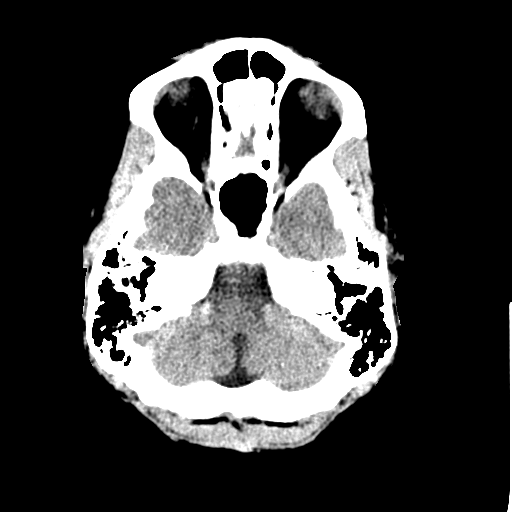
[im 8/28  brain]
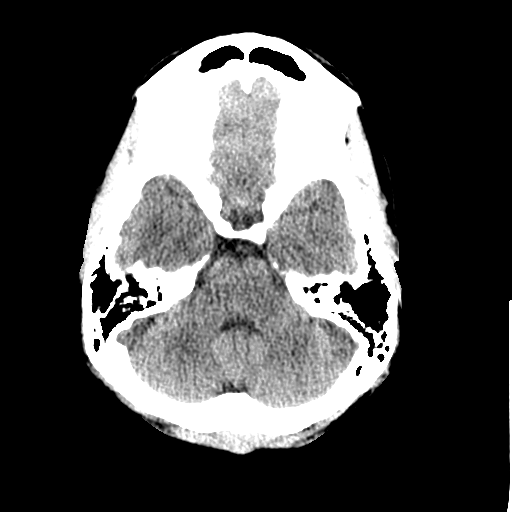
[im 10/28  brain]
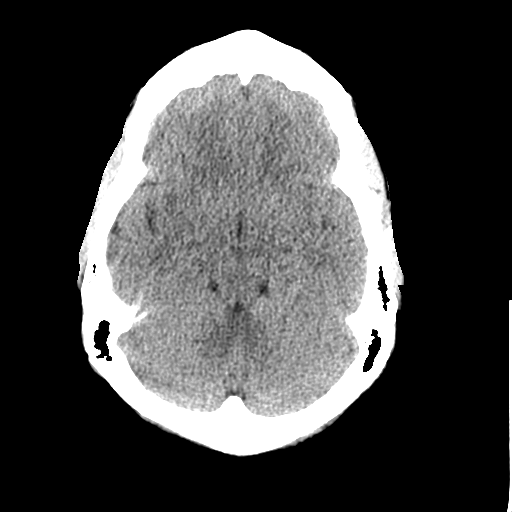
[im 10/28  bone]
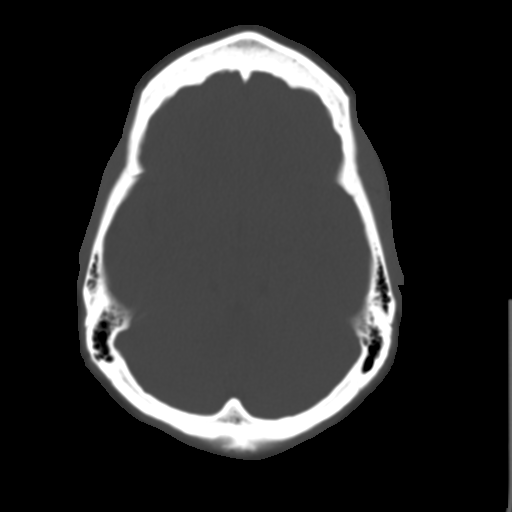
[im 12/28  brain]
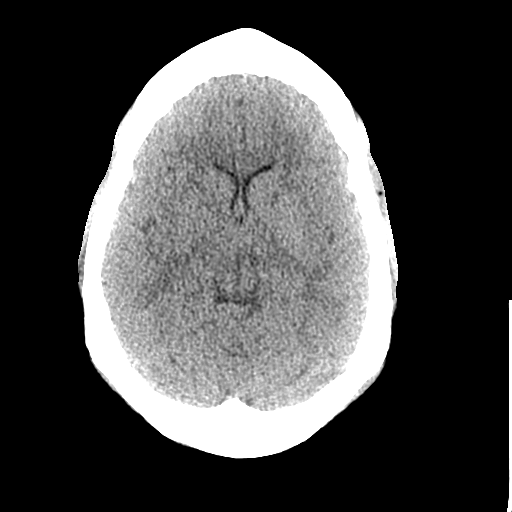
[im 14/28  brain]
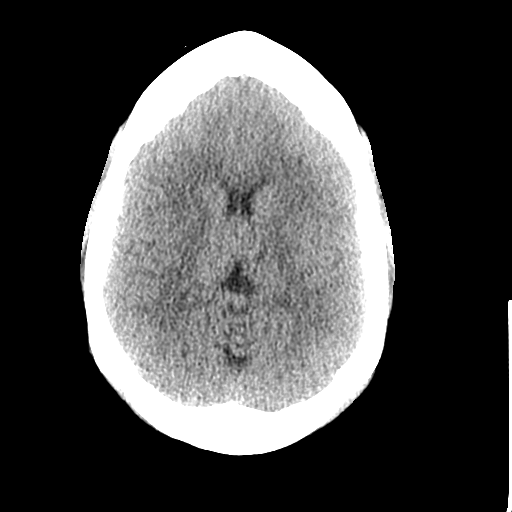
[im 16/28  brain]
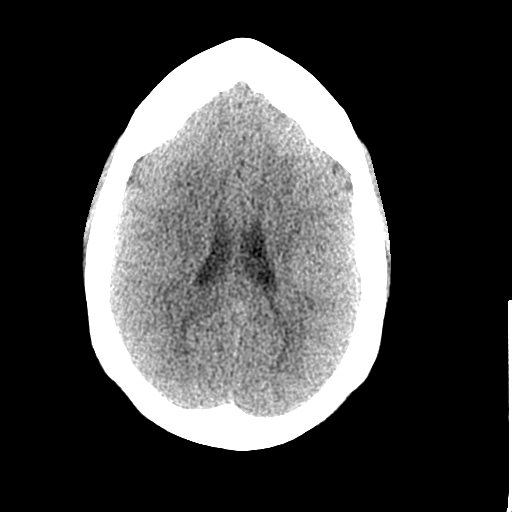
[im 18/28  brain]
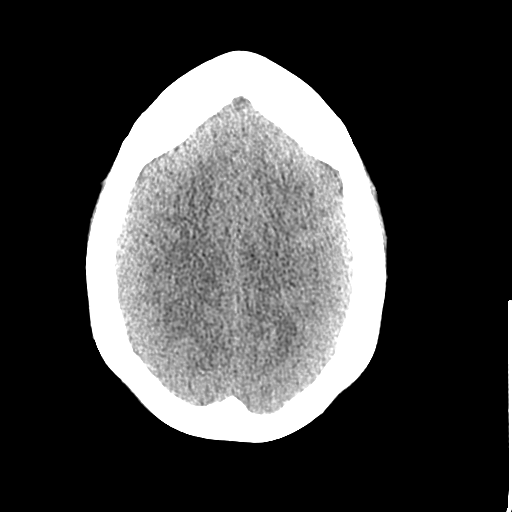
[im 18/28  bone]
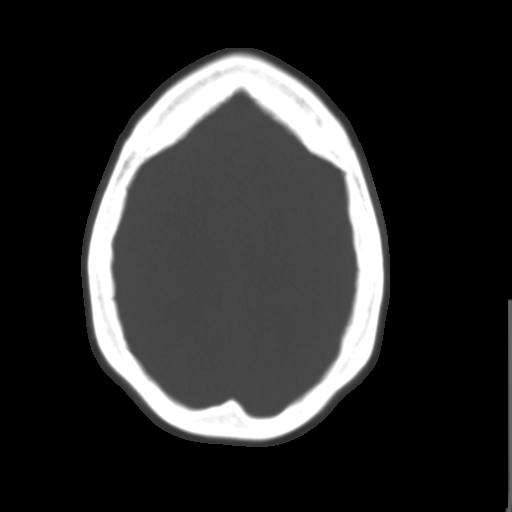
[im 20/28  brain]
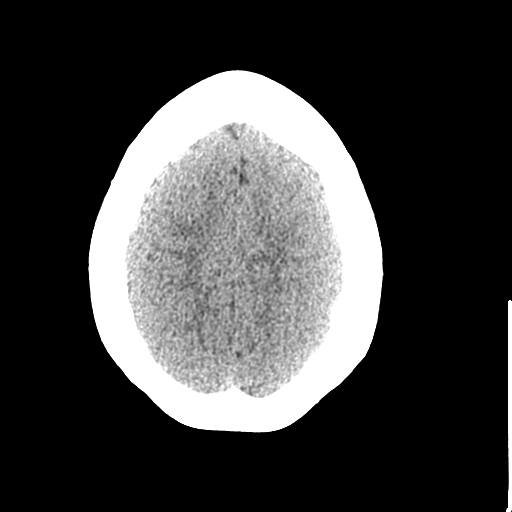
[im 22/28  brain]
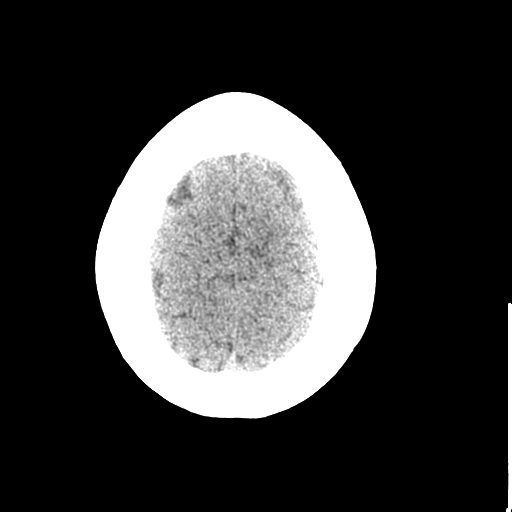
[im 24/28  brain]
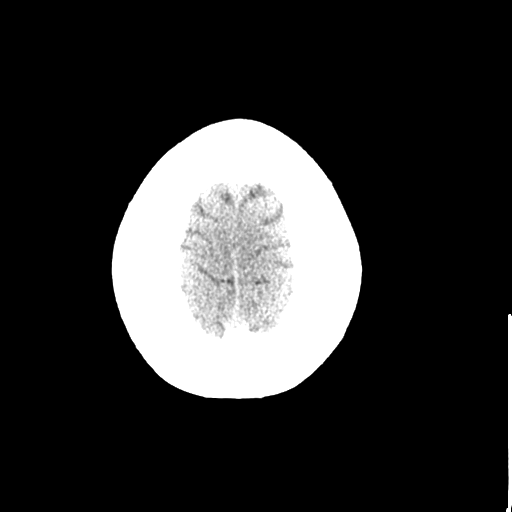
[im 26/28  brain]
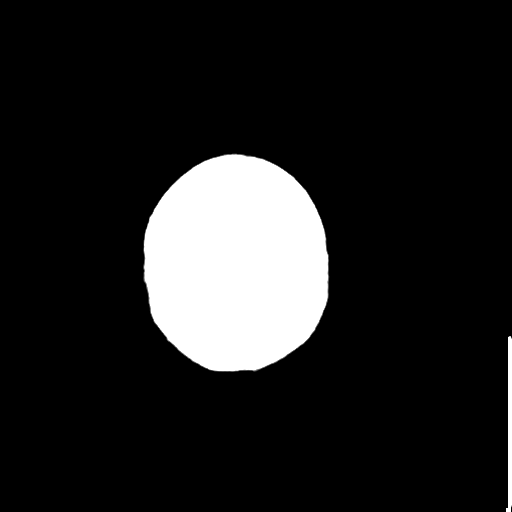
[im 26/28  bone]
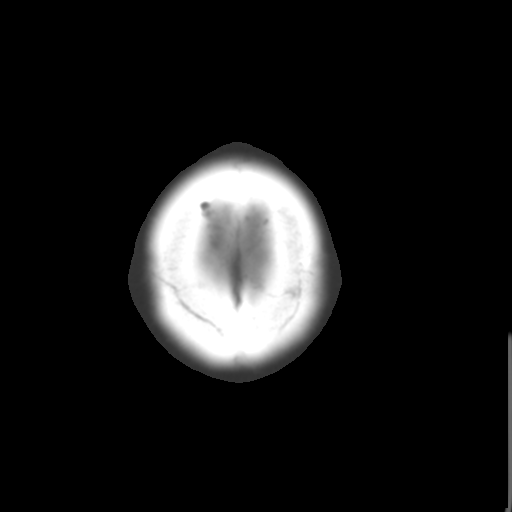

[Series 3: bone windows · axial · 0.43mm/px · z∈[-147,-107]mm · 3 of 28 slices shown]
[im 2/28  bone]
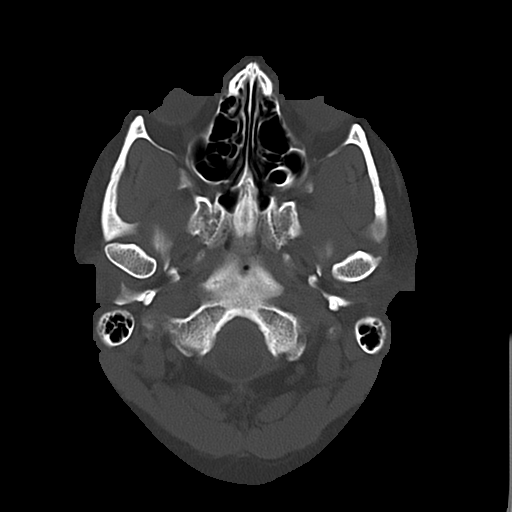
[im 6/28  bone]
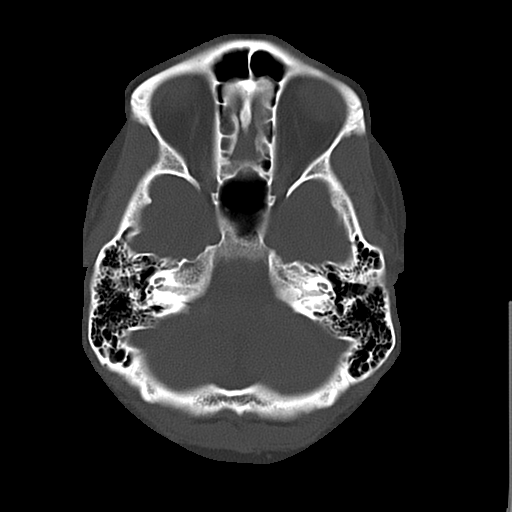
[im 10/28  bone]
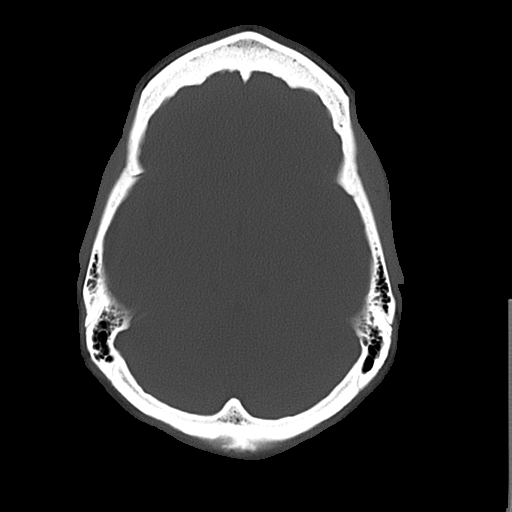

[16 of 30 positions shown; findings below may reference images not displayed]

FINDINGS: Periventricular white matter hypodensities are stable. No
acute hemorrhage, acute infarction, or mass lesion is seen.  No
midline shift.  No ventriculomegaly.  Orbits and paranasal sinuses
are intact.  No skull fracture.
IMPRESSION: Stable mild periventricular white matter hypodensity, differential
previously discussed in dedicated imaging.  No new acute
intracranial finding.

## 2012-03-22 IMAGING — CR DG CHEST 2V
2 series · 2 of 2 positions shown · non-contrast
Comparison: 05/04/2007

CLINICAL DATA: Chest pain and shortness of breath

CHEST - 2 VIEW

[w chest pa]
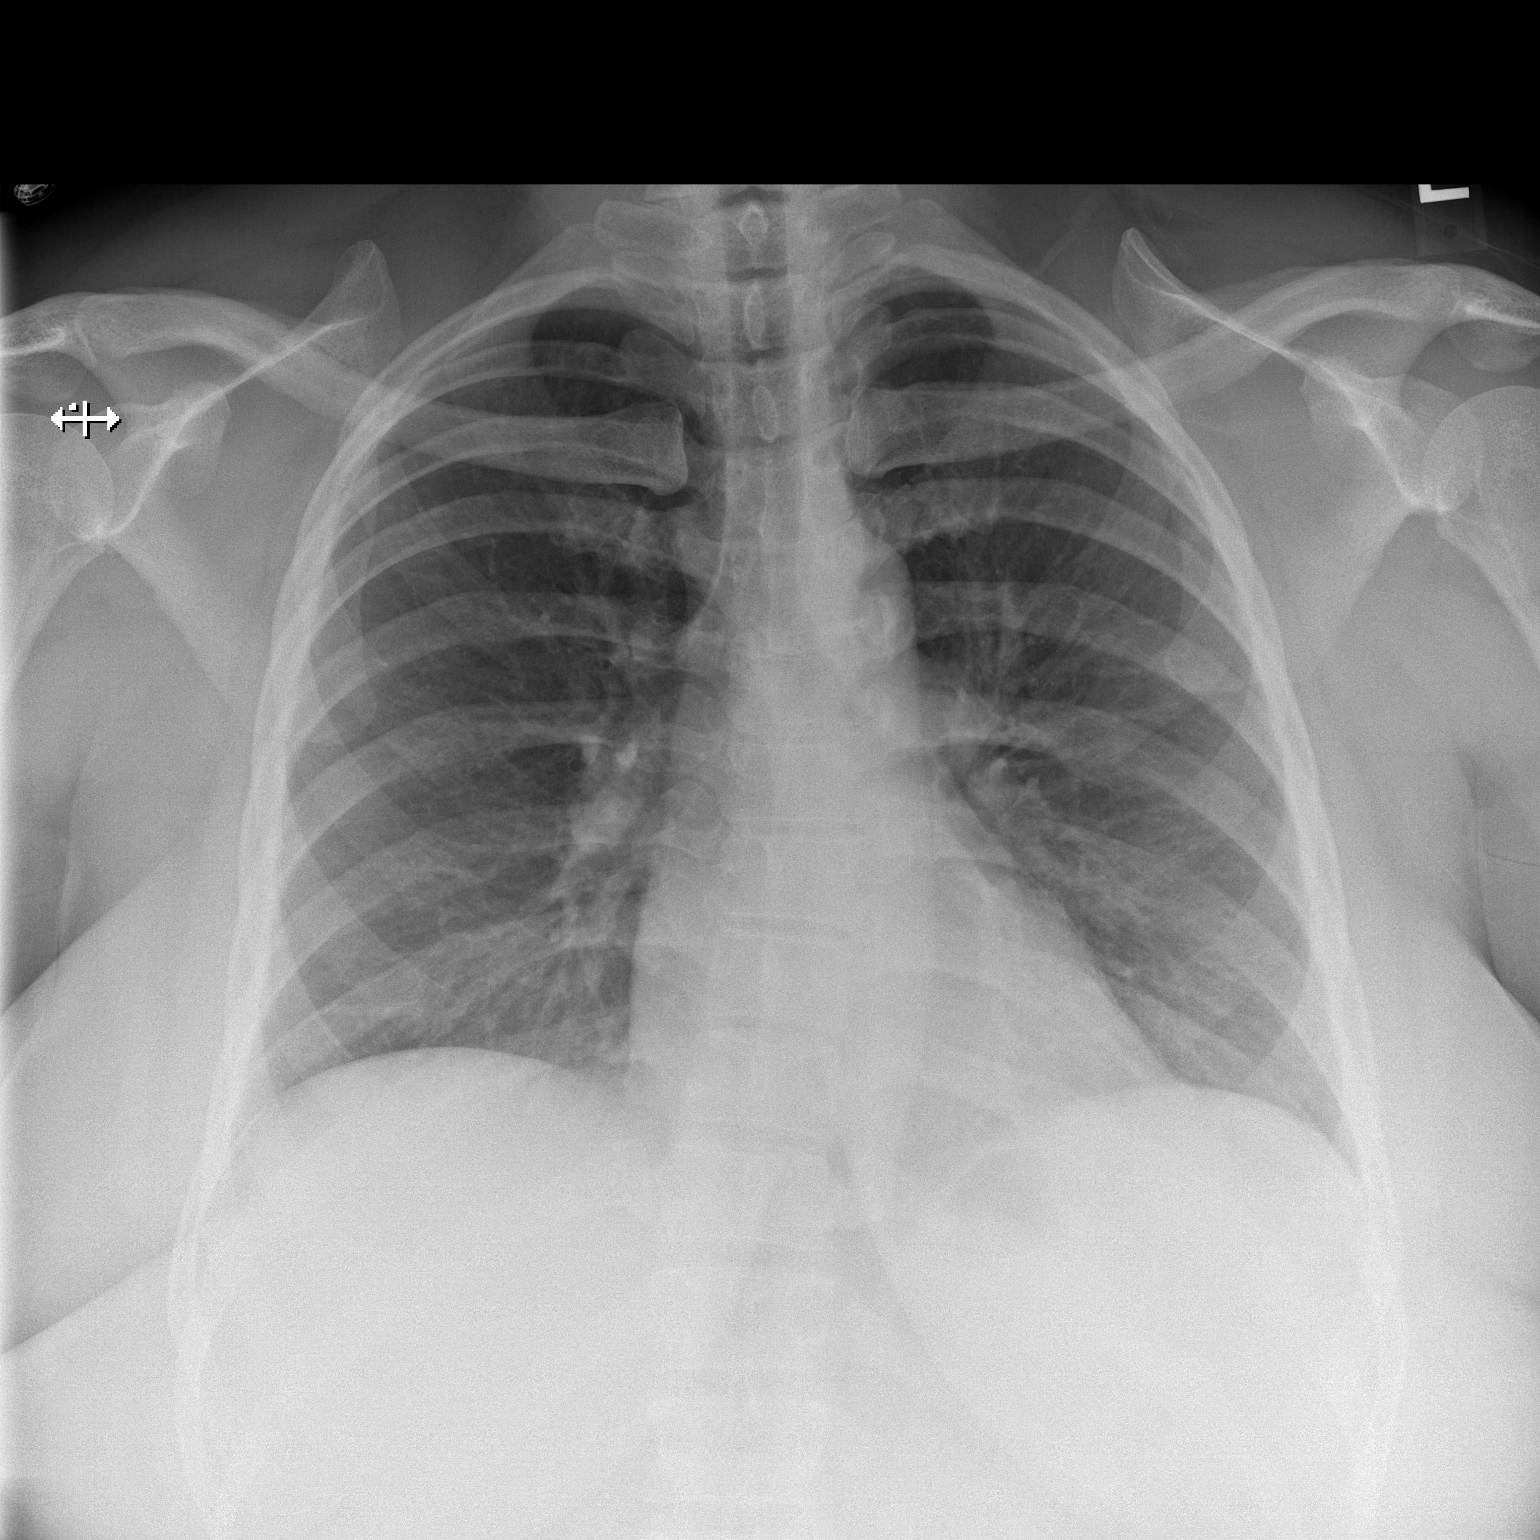

[w chest lat]
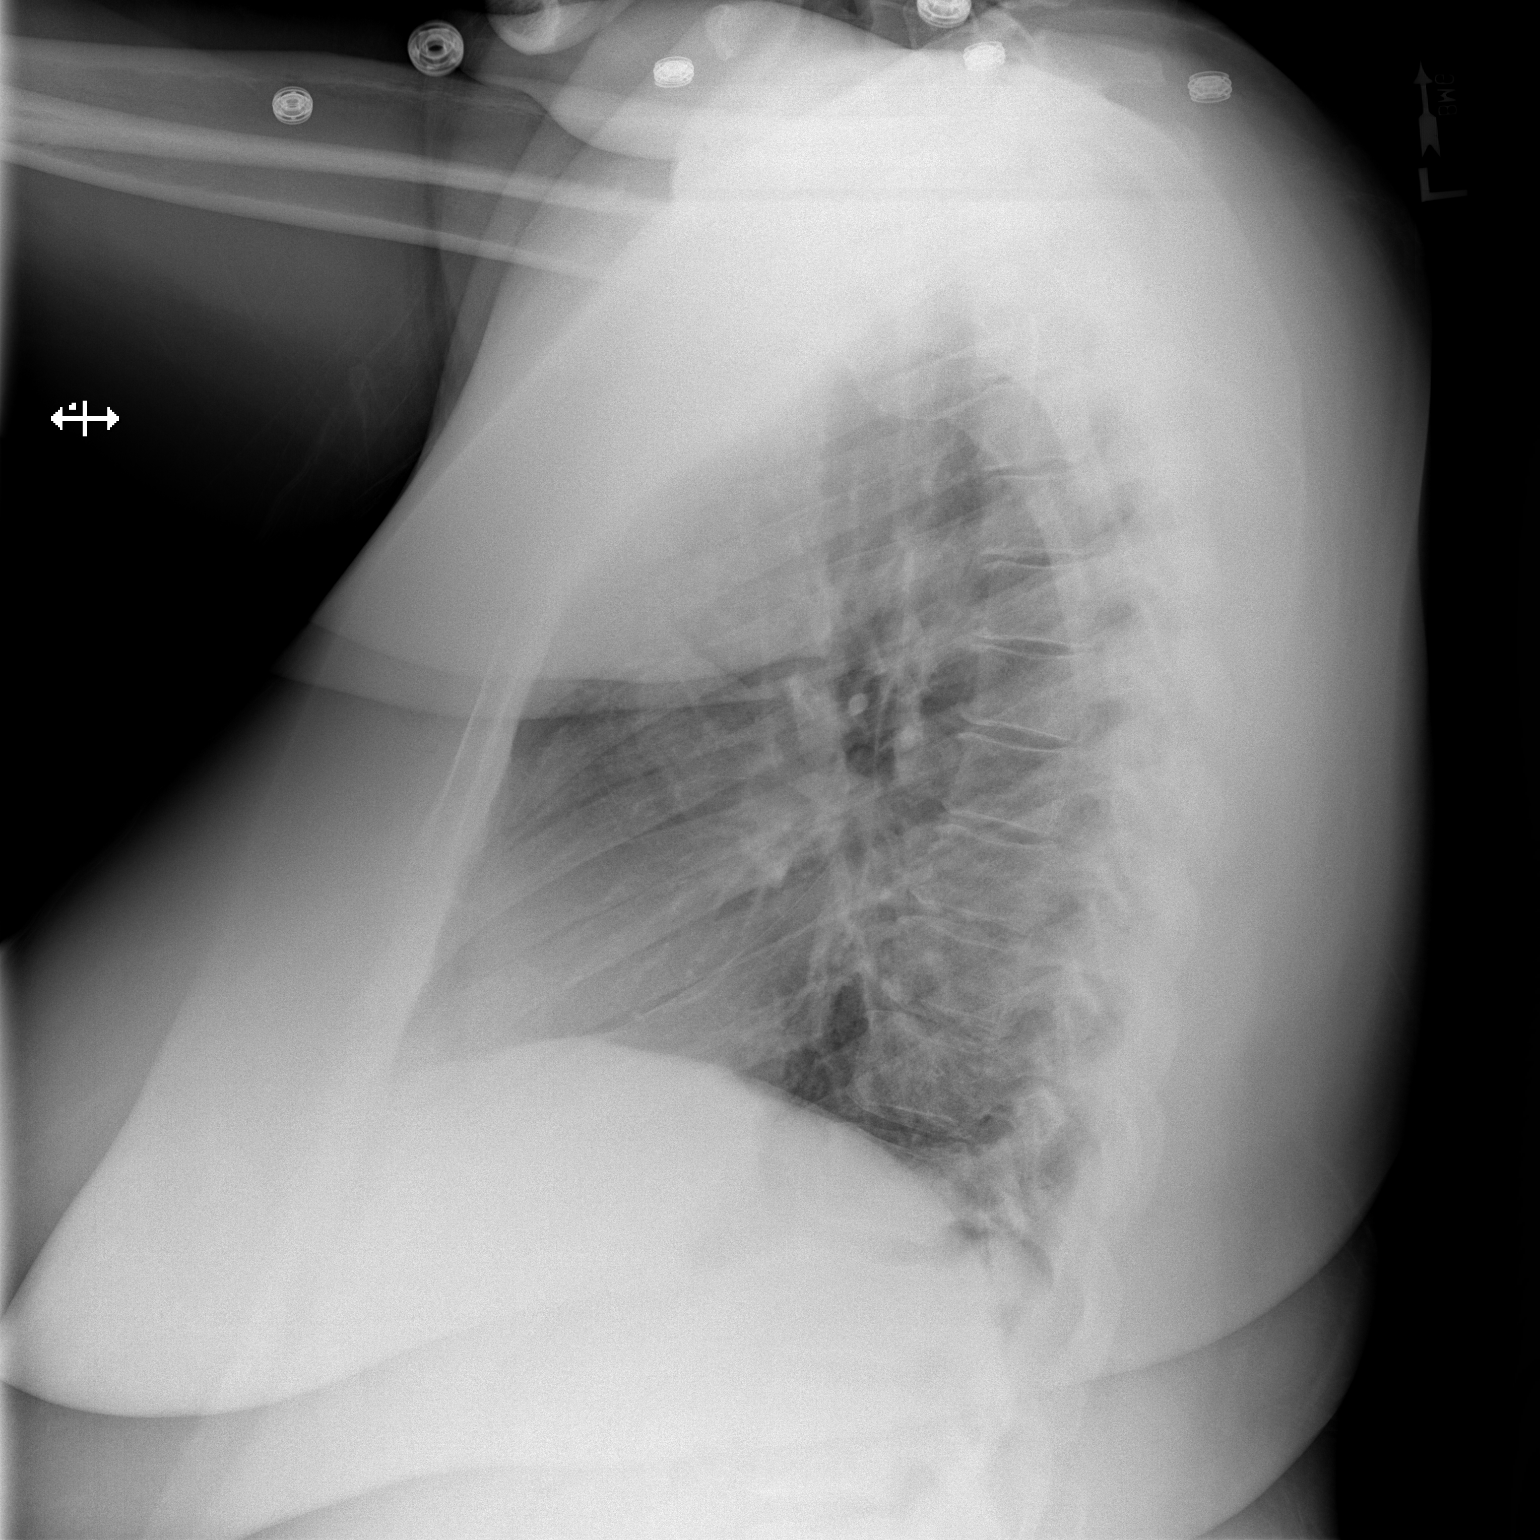

[2 of 2 positions shown; findings below may reference images not displayed]

FINDINGS: Cardiomediastinal silhouette is within normal limits. The
lungs are clear. No pleural effusion.  No pneumothorax.  No acute
osseous abnormality.
IMPRESSION: Normal chest.

## 2012-07-11 ENCOUNTER — Other Ambulatory Visit: Payer: Self-pay | Admitting: Internal Medicine

## 2012-07-11 DIAGNOSIS — Z1231 Encounter for screening mammogram for malignant neoplasm of breast: Secondary | ICD-10-CM

## 2012-07-14 ENCOUNTER — Emergency Department (HOSPITAL_COMMUNITY)
Admission: EM | Admit: 2012-07-14 | Discharge: 2012-07-14 | Disposition: A | Discharge status: Home / Self Care | Payer: BC Managed Care – PPO | Attending: Emergency Medicine | Admitting: Emergency Medicine

## 2012-07-14 ENCOUNTER — Emergency Department (HOSPITAL_COMMUNITY): Payer: BC Managed Care – PPO

## 2012-07-14 ENCOUNTER — Encounter (HOSPITAL_COMMUNITY): Payer: Self-pay | Admitting: *Deleted

## 2012-07-14 DIAGNOSIS — Y9389 Activity, other specified: Secondary | ICD-10-CM | POA: Insufficient documentation

## 2012-07-14 DIAGNOSIS — E7800 Pure hypercholesterolemia, unspecified: Secondary | ICD-10-CM | POA: Insufficient documentation

## 2012-07-14 DIAGNOSIS — M549 Dorsalgia, unspecified: Secondary | ICD-10-CM

## 2012-07-14 DIAGNOSIS — W010XXA Fall on same level from slipping, tripping and stumbling without subsequent striking against object, initial encounter: Secondary | ICD-10-CM | POA: Insufficient documentation

## 2012-07-14 DIAGNOSIS — IMO0002 Reserved for concepts with insufficient information to code with codable children: Secondary | ICD-10-CM | POA: Insufficient documentation

## 2012-07-14 DIAGNOSIS — I10 Essential (primary) hypertension: Secondary | ICD-10-CM | POA: Insufficient documentation

## 2012-07-14 DIAGNOSIS — Y92009 Unspecified place in unspecified non-institutional (private) residence as the place of occurrence of the external cause: Secondary | ICD-10-CM | POA: Insufficient documentation

## 2012-07-14 DIAGNOSIS — Z794 Long term (current) use of insulin: Secondary | ICD-10-CM | POA: Insufficient documentation

## 2012-07-14 DIAGNOSIS — E119 Type 2 diabetes mellitus without complications: Secondary | ICD-10-CM | POA: Insufficient documentation

## 2012-07-14 DIAGNOSIS — Z79899 Other long term (current) drug therapy: Secondary | ICD-10-CM | POA: Insufficient documentation

## 2012-07-14 DIAGNOSIS — S39012A Strain of muscle, fascia and tendon of lower back, initial encounter: Secondary | ICD-10-CM

## 2012-07-14 MED ORDER — IBUPROFEN 600 MG PO TABS: 600.0000 mg | ORAL_TABLET | Freq: Four times a day (QID) | ORAL | Status: DC | PRN

## 2012-07-14 MED ORDER — METHOCARBAMOL 500 MG PO TABS: 500.0000 mg | ORAL_TABLET | Freq: Two times a day (BID) | ORAL | Status: DC

## 2012-07-14 MED ORDER — IBUPROFEN 400 MG PO TABS
800.0000 mg | ORAL_TABLET | Freq: Once | ORAL | Status: AC
  Administered 2012-07-14: 800 mg via ORAL
  Filled 2012-07-14: qty 2

## 2012-07-14 NOTE — ED Provider Notes (Signed)
Medical screening examination/treatment/procedure(s) were performed by non-physician practitioner and as supervising physician I was immediately available for consultation/collaboration.  Martha K Linker, MD 07/14/12 2339 

## 2012-07-14 NOTE — ED Notes (Signed)
Alert, NAD, calm, interactive, no changes, no s/sx of pain except for verbal complaint, resting comfortably in stretcher, pending xray, registration at [facility]. Toddler present with pt.

## 2012-07-14 NOTE — ED Provider Notes (Signed)
History    This chart was scribed for non-physician practitioner working with Ethelda Chick, MD by Toya Smothers, ED Scribe. This patient was seen in room TR07C/TR07C and the patient's care was started at 10:26 PM.   CSN: [id number]  Arrival date & time 07/14/12  2028   First MD Initiated Contact with Patient 07/14/12 2052      Chief Complaint  Patient presents with  . Back Pain  . Fall    The history is provided by the patient. No language interpreter was used.    Marilyn [name] is a 47 y.o. female with h/o diabetes mellitus, HTN, and hypercholesteremia, who presents to the ED c/o of 2 days of unchanged lower back pain as the result a of a fall. Pain is 8/10, worse with sitting and standing, and intermittently radiates inferiorly. No loss of bowel or bladder function. Pt tripped on carpet at home, twisting and falling forward onto her coccyx. She denies cephalic injury, dysuria, abdominal pain, neck pain, and difficulty breathing. Symptoms have not been treated PTA. Pt denies use of tobacco, alcohol, and illicit drug use.     Past Medical History  Diagnosis Date  . Diabetes mellitus   . Hypertension   . Hypercholesteremia     Past Surgical History  Procedure Laterality Date  . Cesarean section      No family history on file.  History  Substance Use Topics  . Smoking status: Never Smoker   . Smokeless tobacco: Not on file  . Alcohol Use: No    Review of Systems  Musculoskeletal: Positive for back pain.  All other systems reviewed and are negative.    Allergies  Review of patient's allergies indicates no known allergies.  Home Medications   Current Outpatient Rx  Name  Route  Sig  Dispense  Refill  . CINNAMON PO   Oral   Take 2 tablets by mouth daily.         . insulin lispro (HUMALOG KWIKPEN) 100 UNIT/ML injection   Subcutaneous   Inject 60 Units into the skin 2 (two) times daily.         [name] lisinopril (PRINIVIL,ZESTRIL) 10 MG tablet   Oral   Take  10 mg by mouth daily.         . sitaGLIPtan-metformin (JANUMET) 50-1000 MG per tablet   Oral   Take 1 tablet by mouth 2 (two) times daily with a meal.         . ibuprofen (ADVIL,MOTRIN) 600 MG tablet   Oral   Take 1 tablet (600 mg total) by mouth every 6 (six) hours as needed for pain.   30 tablet   0   . methocarbamol (ROBAXIN) 500 MG tablet   Oral   Take 1 tablet (500 mg total) by mouth 2 (two) times daily.   20 tablet   0     BP 137/86  Pulse 98  Temp(Src) 98.6 F (37 C)  Resp 18  Ht 5\' 9"  (1.753 m)  Wt 295 lb (133.811 kg)  BMI 43.54 kg/m2  SpO2 97%  LMP 06/12/2012  Physical Exam  Nursing note and vitals reviewed. Constitutional: She is oriented to person, place, and time. She appears well-developed and well-nourished.  HENT:  Head: Normocephalic and atraumatic.  Eyes: Conjunctivae and EOM are normal.  Neck: Normal range of motion.  Cardiovascular: Normal rate.   Pulmonary/Chest: Effort normal.  Abdominal: She exhibits no distension.  Musculoskeletal: Normal range of motion.  Lumbar spine tender  to palpation. No gross abnormality or deformity. No step offs. No other tenderness throught the spine. Lumbar paraspinal muscles tender. Pt can ambulate  Neurological: She is alert and oriented to person, place, and time.  Skin: Skin is dry.  Psychiatric: She has a normal mood and affect. Her behavior is normal. Judgment and thought content normal.    ED Course  Procedures DIAGNOSTIC STUDIES: Oxygen Saturation is 97% on room air, normal by my interpretation.    COORDINATION OF CARE: 20:55- Evaluated Pt. Pt is awake, alert, and without distress. 20:58- Ordered DG Lumbar Spine Complete 1 time imaging.  20:59- Patient understands and agrees with initial ED impression and plan with expectations set for ED visit.  Plan: Home Medications- Ibuprofen; Home Treatments- Cold compress; Recommended follow up- PCP follow-up   Labs Reviewed - No data to display Dg Lumbar  Spine Complete  07/14/2012  *RADIOLOGY REPORT*  Clinical Data: Lifting injury to the low back approximately 1 week ago.  Persistent low back pain radiating into the lower extremities.  LUMBAR SPINE - COMPLETE 4+ VIEW  Comparison: Bone window images from the CT abdomen and pelvis 05/18/2010.  Findings: Five non-rib bearing lumbar vertebrae with anatomic alignment.  No fractures.  Well-preserved disc spaces.  No pars defects.  Mild facet degenerative changes at L5-S1, right greater than left.  Visualized sacroiliac joints intact.  IMPRESSION: Mild facet degenerative changes at L5-S1, right greater than left. Otherwise normal examination.   Original Report Authenticated By: Hulan Saas, M.D.      1. Back pain   2. Back strain, initial encounter       MDM  Patient with back pain.  No neurological deficits and normal neuro exam.  Patient can walk but states is painful.  No loss of bowel or bladder control.  No concern for cauda equina.  No fever, night sweats, weight loss, h/o cancer, IVDU.  RICE protocol and pain medicine indicated and discussed with patient.        I personally performed the services described in this documentation, which was scribed in my presence. The recorded information has been reviewed and is accurate.     Roxy Horseman, PA-C 07/14/12 2337

## 2012-07-14 NOTE — ED Notes (Addendum)
Hurt back at work Friday 1 week ago. Worked again on Monday & half a day on Wednesday. Have not been back since d/t pain. "Hurt back while pushing & pulling a cart, was moving it out of the way". No meds PTA. Seen by PCP on Monday evening, given Rx, but have not gotten it filled. Pinpoints to mid L back. Ambulatory, carrying toddler.

## 2013-01-28 ENCOUNTER — Encounter (HOSPITAL_COMMUNITY): Payer: Self-pay | Admitting: Emergency Medicine

## 2013-01-28 ENCOUNTER — Emergency Department (INDEPENDENT_AMBULATORY_CARE_PROVIDER_SITE_OTHER)
Admission: EM | Admit: 2013-01-28 | Discharge: 2013-01-28 | Disposition: A | Discharge status: Home / Self Care | Payer: BC Managed Care – PPO | Source: Home / Self Care | Attending: Family Medicine | Admitting: Family Medicine

## 2013-01-28 ENCOUNTER — Other Ambulatory Visit (HOSPITAL_COMMUNITY)
Admission: RE | Admit: 2013-01-28 | Discharge: 2013-01-28 | Disposition: A | Discharge status: Home / Self Care | Payer: BC Managed Care – PPO | Source: Ambulatory Visit | Attending: Family Medicine | Admitting: Family Medicine

## 2013-01-28 DIAGNOSIS — R7309 Other abnormal glucose: Secondary | ICD-10-CM

## 2013-01-28 DIAGNOSIS — Z113 Encounter for screening for infections with a predominantly sexual mode of transmission: Secondary | ICD-10-CM | POA: Insufficient documentation

## 2013-01-28 DIAGNOSIS — R739 Hyperglycemia, unspecified: Secondary | ICD-10-CM

## 2013-01-28 DIAGNOSIS — N760 Acute vaginitis: Secondary | ICD-10-CM | POA: Insufficient documentation

## 2013-01-28 LAB — POCT URINALYSIS DIP (DEVICE)
Glucose, UA: >=1000 mg/dL — AB
Leukocytes, UA: NEGATIVE
Nitrite: NEGATIVE
Protein, ur: NEGATIVE mg/dL
Urobilinogen, UA: 0.2 mg/dL (ref 0.0–1.0)

## 2013-01-28 LAB — POCT I-STAT, CHEM 8
BUN: 8 mg/dL (ref 6–23)
Calcium, Ion: 1.22 mmol/L (ref 1.12–1.23)
Hemoglobin: 13.9 g/dL (ref 12.0–15.0)
Sodium: 137 mEq/L (ref 135–145)
TCO2: 26 mmol/L (ref 0–100)

## 2013-01-28 LAB — GLUCOSE, CAPILLARY: Glucose-Capillary: 243 mg/dL — ABNORMAL HIGH (ref 70–99)

## 2013-01-28 LAB — POCT PREGNANCY, URINE: Preg Test, Ur: NEGATIVE

## 2013-01-28 MED ORDER — SODIUM CHLORIDE 0.9 % IV BOLUS (SEPSIS)
1000.0000 mL | Freq: Once | INTRAVENOUS | Status: AC
  Administered 2013-01-28: 1000 mL via INTRAVENOUS

## 2013-01-28 MED ORDER — INSULIN PEN NEEDLE 31G X 5 MM MISC: Status: DC

## 2013-01-28 MED ORDER — INSULIN ASPART PROT & ASPART (70-30 MIX) 100 UNIT/ML PEN: 60.0000 [IU] | PEN_INJECTOR | Freq: Two times a day (BID) | SUBCUTANEOUS | Status: DC

## 2013-01-28 MED ORDER — INSULIN ASPART 100 UNIT/ML ~~LOC~~ SOLN
15.0000 [IU] | Freq: Once | SUBCUTANEOUS | Status: AC
  Administered 2013-01-28: 15 [IU] via SUBCUTANEOUS

## 2013-01-28 MED ORDER — INSULIN ASPART 100 UNIT/ML ~~LOC~~ SOLN
SUBCUTANEOUS | Status: AC
  Filled 2013-01-28: qty 1

## 2013-01-28 NOTE — ED Notes (Signed)
20 G needle in right hand, one attempt performed by Nira Conn, RN Pt tolerated well.

## 2013-01-28 NOTE — ED Provider Notes (Signed)
CSN: [id number]     Arrival date & time 01/28/13  1433 History   First MD Initiated Contact with Patient 01/28/13 1603     Chief Complaint  Patient presents with  . Urinary Tract Infection   (Consider location/radiation/quality/duration/timing/severity/associated sxs/prior Treatment) HPI Comments: 47 year old female presents complaining of urinary frequency, vaginal discharge, intermittent oliguria, burning with urination, urinary urge incontinence. This began 2 weeks ago with a urinary urge incontinence. All the other symptoms began in the past 2-3 days. She believes she may have a urinary tract infection. She denies abdominal pain, fever, chills, NVD, flank pain. She has diabetes and takes insulin but she ran out one week ago. She does not recall her last hemoglobin A1c but she says that her doctor told her it was good.  Patient is a 47 y.o. female presenting with urinary tract infection.  Urinary Tract Infection Pertinent negatives include no chest pain, no abdominal pain and no shortness of breath.    Past Medical History  Diagnosis Date  . Diabetes mellitus   . Hypertension   . Hypercholesteremia    Past Surgical History  Procedure Laterality Date  . Cesarean section    . Tubal ligation     No family history on file. History  Substance Use Topics  . Smoking status: Never Smoker   . Smokeless tobacco: Not on file  . Alcohol Use: No   OB History   Grav Para Term Preterm Abortions TAB SAB Ect Mult Living                 Review of Systems  Constitutional: Negative for fever and chills.  Eyes: Negative for visual disturbance.  Respiratory: Negative for cough and shortness of breath.   Cardiovascular: Negative for chest pain, palpitations and leg swelling.  Gastrointestinal: Negative for nausea, vomiting and abdominal pain.  Endocrine: Negative for polydipsia and polyuria.  Genitourinary: Positive for urgency, frequency, decreased urine volume, vaginal bleeding and  vaginal discharge. Negative for dysuria, hematuria, flank pain, vaginal pain and pelvic pain.  Musculoskeletal: Negative for arthralgias and myalgias.  Skin: Negative for rash.  Neurological: Negative for dizziness, weakness and light-headedness.    Allergies  Review of patient's allergies indicates no known allergies.  Home Medications   Current Outpatient Rx  Name  Route  Sig  Dispense  Refill  . gabapentin (NEURONTIN) 100 MG capsule   Oral   Take 100 mg by mouth 3 (three) times daily.         . insulin lispro (HUMALOG KWIKPEN) 100 UNIT/ML injection   Subcutaneous   Inject 60 Units into the skin 2 (two) times daily.         [name] lisinopril (PRINIVIL,ZESTRIL) 10 MG tablet   Oral   Take 10 mg by mouth daily.         . methocarbamol (ROBAXIN) 500 MG tablet   Oral   Take 1 tablet (500 mg total) by mouth 2 (two) times daily.   20 tablet   0   . sitaGLIPtan-metformin (JANUMET) 50-1000 MG per tablet   Oral   Take 1 tablet by mouth 2 (two) times daily with a meal.         . CINNAMON PO   Oral   Take 2 tablets by mouth daily.         [name] ibuprofen (ADVIL,MOTRIN) 600 MG tablet   Oral   Take 1 tablet (600 mg total) by mouth every 6 (six) hours as needed for pain.   30  tablet   0   . Insulin Aspart Prot & Aspart (NOVOLOG MIX 70/30 FLEXPEN) (70-30) 100 UNIT/ML SUPN   Subcutaneous   Inject 60 Units into the skin 2 (two) times daily.   8 pen   0   . Insulin Pen Needle 31G X 5 MM MISC      Please give one container of at least 60 needles that match the insulin pen also prescribed.   100 each   0    BP 134/78  Pulse 110  Temp(Src) 99 F (37.2 C) (Oral)  Resp 18  SpO2 100%  LMP 01/17/2013 Physical Exam  Nursing note and vitals reviewed. Constitutional: She is oriented to person, place, and time. Vital signs are normal. She appears well-developed and well-nourished. No distress.  HENT:  Head: Normocephalic and atraumatic.  Right Ear: External ear normal.   Left Ear: External ear normal.  Nose: Nose normal.  Mouth/Throat: Oropharynx is clear and moist. No oropharyngeal exudate.  Cardiovascular: Normal rate, regular rhythm and normal heart sounds.   Pulmonary/Chest: Effort normal and breath sounds normal. No respiratory distress. She has no wheezes. She has no rales.  Abdominal: Soft. She exhibits no mass. There is no tenderness. There is no rebound, no guarding and no CVA tenderness.  Genitourinary: Vagina normal and uterus normal. No vaginal discharge found.  Neurological: She is alert and oriented to person, place, and time. She has normal strength. Coordination normal.  Skin: Skin is warm and dry. No rash noted. She is not diaphoretic.  Psychiatric: She has a normal mood and affect. Judgment normal.    ED Course  Procedures (including critical care time) Labs Review Labs Reviewed  POCT URINALYSIS DIP (DEVICE) - Abnormal; Notable for the following:    Glucose, UA >=1000 (*)    All other components within normal limits  POCT I-STAT, CHEM 8 - Abnormal; Notable for the following:    Glucose, Bld 358 (*)    All other components within normal limits  URINE CULTURE  POCT PREGNANCY, URINE  CERVICOVAGINAL ANCILLARY ONLY   Imaging Review No results found.  I believe all of this patient's symptoms are being caused by hyperglycemia. Will give her one liter saline bolus and 15 units of rapid acting insulin and reassess  After her bolus, blood sugar is down to 248 and she states she is feeling much better at this time. We will discharge her with followup with her primary care physician within 2 days.   MDM   1. Hyperglycemia without ketosis    The patient significantly improved with insulin and saline bolus. She will be discharged. Her insulin has been refill. She will followup with her primary care physician. We have sent a urine culture and vaginal swabs, we may adjust the plan based on these. Vitals are normal and stable at time of  discharge.   Meds ordered this encounter  Medications  . sodium chloride 0.9 % bolus 1,000 mL    Sig:   . insulin aspart (novoLOG) injection 15 Units    Sig:   . Insulin Aspart Prot & Aspart (NOVOLOG MIX 70/30 FLEXPEN) (70-30) 100 UNIT/ML SUPN    Sig: Inject 60 Units into the skin 2 (two) times daily.    Dispense:  8 pen    Refill:  0    Order Specific Question:  Supervising Provider    Answer:  Clementeen Graham, S [id number]  . Insulin Pen Needle 31G X 5 MM MISC    Sig: Please give one  container of at least 60 needles that match the insulin pen also prescribed.    Dispense:  100 each    Refill:  0    Order Specific Question:  Supervising Provider    Answer:  Clementeen Graham, Kathie Rhodes [[id number]]       Graylon Good, PA-C 01/29/13 [phone]

## 2013-01-28 NOTE — ED Notes (Signed)
Pt c/o frquency urinating and vaginal itching with mild discharge and bleeding x 10 days. Pt reports she has been drinking cranberry juice with mild relief. Pt denies fever, n/v/d. Pt is alert and oriented.

## 2013-01-30 ENCOUNTER — Encounter (HOSPITAL_COMMUNITY): Payer: Self-pay | Admitting: Emergency Medicine

## 2013-01-30 ENCOUNTER — Emergency Department (HOSPITAL_COMMUNITY)
Admission: EM | Admit: 2013-01-30 | Discharge: 2013-01-30 | Disposition: A | Discharge status: Home / Self Care | Payer: BC Managed Care – PPO | Attending: Emergency Medicine | Admitting: Emergency Medicine

## 2013-01-30 DIAGNOSIS — R739 Hyperglycemia, unspecified: Secondary | ICD-10-CM

## 2013-01-30 DIAGNOSIS — Z8639 Personal history of other endocrine, nutritional and metabolic disease: Secondary | ICD-10-CM | POA: Insufficient documentation

## 2013-01-30 DIAGNOSIS — K649 Unspecified hemorrhoids: Secondary | ICD-10-CM | POA: Insufficient documentation

## 2013-01-30 DIAGNOSIS — Z794 Long term (current) use of insulin: Secondary | ICD-10-CM | POA: Insufficient documentation

## 2013-01-30 DIAGNOSIS — Z862 Personal history of diseases of the blood and blood-forming organs and certain disorders involving the immune mechanism: Secondary | ICD-10-CM | POA: Insufficient documentation

## 2013-01-30 DIAGNOSIS — I10 Essential (primary) hypertension: Secondary | ICD-10-CM | POA: Insufficient documentation

## 2013-01-30 DIAGNOSIS — Z79899 Other long term (current) drug therapy: Secondary | ICD-10-CM | POA: Insufficient documentation

## 2013-01-30 DIAGNOSIS — E119 Type 2 diabetes mellitus without complications: Secondary | ICD-10-CM | POA: Insufficient documentation

## 2013-01-30 DIAGNOSIS — B372 Candidiasis of skin and nail: Secondary | ICD-10-CM | POA: Insufficient documentation

## 2013-01-30 LAB — COMPREHENSIVE METABOLIC PANEL
AST: 17 U/L (ref 0–37)
Albumin: 3.1 g/dL — ABNORMAL LOW (ref 3.5–5.2)
Alkaline Phosphatase: 99 U/L (ref 39–117)
CO2: 25 mEq/L (ref 19–32)
Chloride: 95 mEq/L — ABNORMAL LOW (ref 96–112)
GFR calc non Af Amer: >90 mL/min (ref >90)
Potassium: 3.3 mEq/L — ABNORMAL LOW (ref 3.5–5.1)
Total Bilirubin: 0.2 mg/dL — ABNORMAL LOW (ref 0.3–1.2)

## 2013-01-30 LAB — URINALYSIS, ROUTINE W REFLEX MICROSCOPIC
Bilirubin Urine: NEGATIVE
Hgb urine dipstick: NEGATIVE
Ketones, ur: NEGATIVE mg/dL
Nitrite: NEGATIVE
Urobilinogen, UA: 0.2 mg/dL (ref 0.0–1.0)

## 2013-01-30 LAB — CBC
HCT: 34.8 % — ABNORMAL LOW (ref 36.0–46.0)
MCV: 83.5 fL (ref 78.0–100.0)
Platelets: 290 10*3/uL (ref 150–400)
RBC: 4.17 MIL/uL (ref 3.87–5.11)
RDW: 12 % (ref 11.5–15.5)
WBC: 7.9 10*3/uL (ref 4.0–10.5)

## 2013-01-30 LAB — URINE MICROSCOPIC-ADD ON

## 2013-01-30 LAB — GLUCOSE, CAPILLARY: Glucose-Capillary: 285 mg/dL — ABNORMAL HIGH (ref 70–99)

## 2013-01-30 MED ORDER — SODIUM CHLORIDE 0.9 % IV BOLUS (SEPSIS)
1000.0000 mL | Freq: Once | INTRAVENOUS | Status: AC
  Administered 2013-01-30: 1000 mL via INTRAVENOUS

## 2013-01-30 MED ORDER — LISINOPRIL 20 MG PO TABS: 10.0000 mg | ORAL_TABLET | Freq: Every day | ORAL | Status: DC

## 2013-01-30 MED ORDER — INSULIN ASPART PROT & ASPART (70-30 MIX) 100 UNIT/ML ~~LOC~~ SUSP: 60.0000 [IU] | Freq: Two times a day (BID) | SUBCUTANEOUS | Status: DC

## 2013-01-30 MED ORDER — INSULIN ASPART 100 UNIT/ML ~~LOC~~ SOLN
5.0000 [IU] | Freq: Once | SUBCUTANEOUS | Status: AC
  Administered 2013-01-30: 5 [IU] via INTRAVENOUS
  Filled 2013-01-30: qty 1

## 2013-01-30 MED ORDER — FLUCONAZOLE 100 MG PO TABS
200.0000 mg | ORAL_TABLET | Freq: Once | ORAL | Status: AC
  Administered 2013-01-30: 200 mg via ORAL
  Filled 2013-01-30: qty 2

## 2013-01-30 MED ORDER — SITAGLIPTIN PHOS-METFORMIN HCL 50-1000 MG PO TABS: 1.0000 | ORAL_TABLET | Freq: Two times a day (BID) | ORAL | Status: DC

## 2013-01-30 MED ORDER — NYSTATIN 100000 UNIT/GM EX CREA: TOPICAL_CREAM | CUTANEOUS | Status: DC

## 2013-01-30 NOTE — ED Notes (Signed)
Pt woke up and was not feeling good when she woke up and checked her sugar and it was 447.  Also has a dry socket and feels like her Hemorids are out

## 2013-01-30 NOTE — ED Provider Notes (Signed)
Medical screening examination/treatment/procedure(s) were performed by a resident physician or non-physician practitioner and as the supervising physician I was immediately available for consultation/collaboration.  Kaydyn Sayas, MD    Zariah Jost S Fatema Rabe, MD 01/30/13 0757 

## 2013-01-30 NOTE — ED Provider Notes (Signed)
Medical screening examination/treatment/procedure(s) were performed by non-physician practitioner and as supervising physician I was immediately available for consultation/collaboration.    Luise Yamamoto M Mkenzie Dotts, MD 01/30/13 0717 

## 2013-01-30 NOTE — ED Provider Notes (Signed)
CSN: [id number]     Arrival date & time 01/30/13  4098 History   First MD Initiated Contact with Patient 01/30/13 [phone]     Chief Complaint  Patient presents with  . Hyperglycemia   HPI  History provided by the patient. The patient is a 47 year old female with history of hypertension, hyperlipidemia and diabetes who presents with concerns for elevated blood sugar. Patient states that she was restless and not sleeping well and when she woke up she checked her blood sugar which was 447. She also states that she has been having some burning and pain to the rectal and genital areas. She states that she feels like this is related to her hemorrhoids. She is unsure of any other rash to the skin. She has been using Vaseline over the skin without any significant improvements. She denies any diarrhea or constipation. Denies any dysuria, hematuria, abdominal pain or flank pain. No vaginal bleeding or vaginal discharge. She has had some recent increased polydipsia and polyuria. She denies any associated fever, chills or sweats.    Past Medical History  Diagnosis Date  . Diabetes mellitus   . Hypertension   . Hypercholesteremia    Past Surgical History  Procedure Laterality Date  . Cesarean section    . Tubal ligation     No family history on file. History  Substance Use Topics  . Smoking status: Never Smoker   . Smokeless tobacco: Not on file  . Alcohol Use: No   OB History   Grav Para Term Preterm Abortions TAB SAB Ect Mult Living                 Review of Systems  Constitutional: Negative for fever, chills and fatigue.  Gastrointestinal: Negative for nausea, vomiting, diarrhea, constipation and anal bleeding.  Endocrine: Positive for polydipsia and polyuria.  Genitourinary: Negative for dysuria, hematuria and flank pain.  All other systems reviewed and are negative.    Allergies  Review of patient's allergies indicates no known allergies.  Home Medications   Current Outpatient  Rx  Name  Route  Sig  Dispense  Refill  . CINNAMON PO   Oral   Take 2 tablets by mouth daily.         [name] gabapentin (NEURONTIN) 100 MG capsule   Oral   Take 100 mg by mouth 3 (three) times daily.         [name] ibuprofen (ADVIL,MOTRIN) 600 MG tablet   Oral   Take 1 tablet (600 mg total) by mouth every 6 (six) hours as needed for pain.   30 tablet   0   . Insulin Aspart Prot & Aspart (NOVOLOG MIX 70/30 FLEXPEN) (70-30) 100 UNIT/ML SUPN   Subcutaneous   Inject 60 Units into the skin 2 (two) times daily.   8 pen   0   . insulin lispro (HUMALOG KWIKPEN) 100 UNIT/ML injection   Subcutaneous   Inject 60 Units into the skin 2 (two) times daily.         . Insulin Pen Needle 31G X 5 MM MISC      Please give one container of at least 60 needles that match the insulin pen also prescribed.   100 each   0   . lisinopril (PRINIVIL,ZESTRIL) 10 MG tablet   Oral   Take 10 mg by mouth daily.         . methocarbamol (ROBAXIN) 500 MG tablet   Oral   Take 1 tablet (500 mg  total) by mouth 2 (two) times daily.   20 tablet   0   . sitaGLIPtan-metformin (JANUMET) 50-1000 MG per tablet   Oral   Take 1 tablet by mouth 2 (two) times daily with a meal.          BP 144/68  Pulse 102  Temp(Src) 97.9 F (36.6 C)  Resp 18  SpO2 100%  LMP 01/17/2013 Physical Exam  Nursing note and vitals reviewed. Constitutional: She is oriented to person, place, and time. She appears well-developed and well-nourished. No distress.  HENT:  Head: Normocephalic.  Cardiovascular: Normal rate and regular rhythm.   Pulmonary/Chest: Effort normal and breath sounds normal. No respiratory distress. She has no wheezes. She has no rales.  Abdominal: Soft. There is no tenderness. There is no rebound and no guarding.  Genitourinary:  Chaperone was present. There is a soft hemorrhoid present without significant tenderness or thrombosis. There is diffuse irritation of the skin with mild erythema around the rectal  area and into the groin. No swelling of the labia. No vaginal discharge.  Musculoskeletal: Normal range of motion.  Neurological: She is alert and oriented to person, place, and time.  Skin: Skin is warm and dry. No rash noted.  Psychiatric: She has a normal mood and affect. Her behavior is normal.    ED Course  Procedures   DIAGNOSTIC STUDIES: Oxygen Saturation is 100% on room air.    COORDINATION OF CARE:  Nursing notes reviewed. Vital signs reviewed. Initial pt interview and examination performed.   Patient seen and evaluated. She is well-appearing no acute distress. Discussed work up and treatment plan with pt at bedside, which includes lab testing, urinalysis, IV fluids possible dose of insulin. Will also give dose of Diflucan for suspected yeast infection of the groin. Pt agrees with plan.  Labs without any other concerning findings aside from hyperglycemia. No signs of DKA.  Results for orders placed during the hospital encounter of 01/30/13  CBC      Result Value Range   WBC 7.9  4.0 - 10.5 K/uL   RBC 4.17  3.87 - 5.11 MIL/uL   Hemoglobin 11.6 (*) 12.0 - 15.0 g/dL   HCT 23.7 (*) 62.8 - 31.5 %   MCV 83.5  78.0 - 100.0 fL   MCH 27.8  26.0 - 34.0 pg   MCHC 33.3  30.0 - 36.0 g/dL   RDW 17.6  16.0 - 73.7 %   Platelets 290  150 - 400 K/uL  COMPREHENSIVE METABOLIC PANEL      Result Value Range   Sodium 130 (*) 135 - 145 mEq/L   Potassium 3.3 (*) 3.5 - 5.1 mEq/L   Chloride 95 (*) 96 - 112 mEq/L   CO2 25  19 - 32 mEq/L   Glucose, Bld 403 (*) 70 - 99 mg/dL   BUN 9  6 - 23 mg/dL   Creatinine, Ser 1.06  0.50 - 1.10 mg/dL   Calcium 8.8  8.4 - 26.9 mg/dL   Total Protein 7.3  6.0 - 8.3 g/dL   Albumin 3.1 (*) 3.5 - 5.2 g/dL   AST 17  0 - 37 U/L   ALT 17  0 - 35 U/L   Alkaline Phosphatase 99  39 - 117 U/L   Total Bilirubin 0.2 (*) 0.3 - 1.2 mg/dL   GFR calc non Af Amer >90  >90 mL/min   GFR calc Af Amer >90  >90 mL/min  GLUCOSE, CAPILLARY      Result  Value Range    Glucose-Capillary 376 (*) 70 - 99 mg/dL  URINALYSIS, ROUTINE W REFLEX MICROSCOPIC      Result Value Range   Color, Urine STRAW (*) YELLOW   APPearance CLEAR  CLEAR   Specific Gravity, Urine 1.028  1.005 - 1.030   pH 5.5  5.0 - 8.0   Glucose, UA >1000 (*) NEGATIVE mg/dL   Hgb urine dipstick NEGATIVE  NEGATIVE   Bilirubin Urine NEGATIVE  NEGATIVE   Ketones, ur NEGATIVE  NEGATIVE mg/dL   Protein, ur NEGATIVE  NEGATIVE mg/dL   Urobilinogen, UA 0.2  0.0 - 1.0 mg/dL   Nitrite NEGATIVE  NEGATIVE   Leukocytes, UA TRACE (*) NEGATIVE  GLUCOSE, CAPILLARY      Result Value Range   Glucose-Capillary 321 (*) 70 - 99 mg/dL  URINE MICROSCOPIC-ADD ON      Result Value Range   Squamous Epithelial / LPF FEW (*) RARE   WBC, UA 3-6  <3 WBC/hpf   RBC / HPF 0-2  <3 RBC/hpf   Bacteria, UA FEW (*) RARE    Anion gap equals 10.     MDM   1. Hyperglycemia   2. Yeast dermatitis        Angus Seller, [place] 01/30/13 [phone]

## 2013-01-31 LAB — URINE CULTURE: Colony Count: >=100000

## 2013-02-01 ENCOUNTER — Encounter (HOSPITAL_COMMUNITY): Payer: Self-pay | Admitting: Emergency Medicine

## 2013-02-01 ENCOUNTER — Emergency Department (HOSPITAL_COMMUNITY)
Admission: EM | Admit: 2013-02-01 | Discharge: 2013-02-01 | Disposition: A | Discharge status: Home / Self Care | Payer: BC Managed Care – PPO | Attending: Emergency Medicine | Admitting: Emergency Medicine

## 2013-02-01 DIAGNOSIS — N390 Urinary tract infection, site not specified: Secondary | ICD-10-CM

## 2013-02-01 DIAGNOSIS — N760 Acute vaginitis: Secondary | ICD-10-CM | POA: Insufficient documentation

## 2013-02-01 DIAGNOSIS — I10 Essential (primary) hypertension: Secondary | ICD-10-CM | POA: Insufficient documentation

## 2013-02-01 DIAGNOSIS — Z79899 Other long term (current) drug therapy: Secondary | ICD-10-CM | POA: Insufficient documentation

## 2013-02-01 DIAGNOSIS — Z3202 Encounter for pregnancy test, result negative: Secondary | ICD-10-CM | POA: Insufficient documentation

## 2013-02-01 DIAGNOSIS — E119 Type 2 diabetes mellitus without complications: Secondary | ICD-10-CM | POA: Insufficient documentation

## 2013-02-01 DIAGNOSIS — R110 Nausea: Secondary | ICD-10-CM | POA: Insufficient documentation

## 2013-02-01 DIAGNOSIS — Z792 Long term (current) use of antibiotics: Secondary | ICD-10-CM | POA: Insufficient documentation

## 2013-02-01 DIAGNOSIS — A499 Bacterial infection, unspecified: Secondary | ICD-10-CM | POA: Insufficient documentation

## 2013-02-01 DIAGNOSIS — R81 Glycosuria: Secondary | ICD-10-CM

## 2013-02-01 DIAGNOSIS — B9689 Other specified bacterial agents as the cause of diseases classified elsewhere: Secondary | ICD-10-CM | POA: Insufficient documentation

## 2013-02-01 DIAGNOSIS — Z794 Long term (current) use of insulin: Secondary | ICD-10-CM | POA: Insufficient documentation

## 2013-02-01 LAB — URINALYSIS, ROUTINE W REFLEX MICROSCOPIC
Ketones, ur: NEGATIVE mg/dL
Nitrite: NEGATIVE
Specific Gravity, Urine: 1.024 (ref 1.005–1.030)
Urobilinogen, UA: 0.2 mg/dL (ref 0.0–1.0)
pH: 6.5 (ref 5.0–8.0)

## 2013-02-01 LAB — POCT I-STAT, CHEM 8
BUN: <3 mg/dL — ABNORMAL LOW (ref 6–23)
Calcium, Ion: 1.22 mmol/L (ref 1.12–1.23)
Chloride: 102 meq/L (ref 96–112)
Creatinine, Ser: 0.7 mg/dL (ref 0.50–1.10)
Glucose, Bld: 286 mg/dL — ABNORMAL HIGH (ref 70–99)
HCT: 36 % (ref 36.0–46.0)
Hemoglobin: 12.2 g/dL (ref 12.0–15.0)
Potassium: 3.7 meq/L (ref 3.5–5.1)
Sodium: 140 meq/L (ref 135–145)
TCO2: 25 mmol/L (ref 0–100)

## 2013-02-01 LAB — URINE MICROSCOPIC-ADD ON

## 2013-02-01 LAB — WET PREP, GENITAL: Trich, Wet Prep: NONE SEEN

## 2013-02-01 MED ORDER — CEFTRIAXONE SODIUM 250 MG IJ SOLR
250.0000 mg | Freq: Once | INTRAMUSCULAR | Status: AC
  Administered 2013-02-01: 250 mg via INTRAMUSCULAR
  Filled 2013-02-01: qty 250

## 2013-02-01 MED ORDER — AZITHROMYCIN 1 G PO PACK
1.0000 g | PACK | Freq: Once | ORAL | Status: AC
  Administered 2013-02-01: 1 g via ORAL
  Filled 2013-02-01: qty 1

## 2013-02-01 MED ORDER — KETOROLAC TROMETHAMINE 30 MG/ML IJ SOLN
30.0000 mg | Freq: Once | INTRAMUSCULAR | Status: AC
  Administered 2013-02-01: 30 mg via INTRAMUSCULAR
  Filled 2013-02-01: qty 1

## 2013-02-01 MED ORDER — KETOROLAC TROMETHAMINE 30 MG/ML IJ SOLN
30.0000 mg | Freq: Once | INTRAMUSCULAR | Status: DC
  Administered 2013-02-01: 30 mg via INTRAMUSCULAR

## 2013-02-01 MED ORDER — ONDANSETRON 4 MG PO TBDP
8.0000 mg | ORAL_TABLET | Freq: Once | ORAL | Status: AC
  Administered 2013-02-01: 8 mg via ORAL
  Filled 2013-02-01: qty 2

## 2013-02-01 MED ORDER — KETOROLAC TROMETHAMINE 30 MG/ML IJ SOLN: 30.0000 mg | Freq: Once | INTRAMUSCULAR | Status: DC

## 2013-02-01 MED ORDER — ONDANSETRON 4 MG PO TBDP: 4.0000 mg | ORAL_TABLET | Freq: Three times a day (TID) | ORAL | Status: DC | PRN

## 2013-02-01 MED ORDER — METRONIDAZOLE 500 MG PO TABS: 500.0000 mg | ORAL_TABLET | Freq: Two times a day (BID) | ORAL | Status: DC

## 2013-02-01 MED ORDER — LIDOCAINE HCL (PF) 1 % IJ SOLN
INTRAMUSCULAR | Status: AC
  Administered 2013-02-01: 5 mL
  Filled 2013-02-01: qty 5

## 2013-02-01 MED ORDER — CIPROFLOXACIN HCL 500 MG PO TABS: 500.0000 mg | ORAL_TABLET | Freq: Two times a day (BID) | ORAL | Status: DC

## 2013-02-01 MED ORDER — HYDROCODONE-ACETAMINOPHEN 5-325 MG PO TABS: 1.0000 | ORAL_TABLET | ORAL | Status: DC | PRN

## 2013-02-01 MED ORDER — KETOROLAC TROMETHAMINE 30 MG/ML IJ SOLN
30.0000 mg | Freq: Once | INTRAMUSCULAR | Status: DC
  Filled 2013-02-01: qty 1

## 2013-02-01 NOTE — ED Provider Notes (Signed)
Medical screening examination/treatment/procedure(s) were performed by non-physician practitioner and as supervising physician I was immediately available for consultation/collaboration.  EKG Interpretation   None        Juliet Rude. Rubin Payor, MD 02/01/13 2328

## 2013-02-01 NOTE — ED Provider Notes (Signed)
CSN: [id number]     Arrival date & time 02/01/13  1758 History   First MD Initiated Contact with Patient 02/01/13 2001     Chief Complaint  Patient presents with  . Vaginal Pain   (Consider location/radiation/quality/duration/timing/severity/associated sxs/prior Treatment) HPI Comments: The patient is a G43 P41 47 year old female past medical history significant for HTN, DM, hypercholesterolemia presenting to the emergency department for 2 days of vaginal pain and dysuria. Patient states she feels as though her "clit is swollen and hard." She believes this is a reaction to the topical cream they gave her 2 days ago for a yeast infection. Patient describes her pain as burning and rates it "12/10" with no alleviating or aggravating factors. Patient endorses associated nausea without vomiting. Her abdominal surgical history includes cesarean sections and tubal ligation. Her last menstrual period was 01/17/2013. Patient denies any vaginal discharge, vaginal bleeding, hematuria, fever, diarrhea, constipation, abdominal pain. Denies any recent sexual intercourse.   Patient is a 47 y.o. female presenting with vaginal pain. The history is provided by the patient.  Vaginal Pain Associated symptoms include nausea. Pertinent negatives include no abdominal pain, chest pain, fever or vomiting.    Past Medical History  Diagnosis Date  . Diabetes mellitus   . Hypertension   . Hypercholesteremia    Past Surgical History  Procedure Laterality Date  . Cesarean section    . Tubal ligation     No family history on file. History  Substance Use Topics  . Smoking status: Never Smoker   . Smokeless tobacco: Not on file  . Alcohol Use: No   OB History   Grav Para Term Preterm Abortions TAB SAB Ect Mult Living                 Review of Systems  Constitutional: Negative for fever.  HENT: Negative.   Eyes: Negative.   Respiratory: Negative for shortness of breath.   Cardiovascular: Negative for chest  pain and leg swelling.  Gastrointestinal: Positive for nausea. Negative for vomiting, abdominal pain, diarrhea, constipation, blood in stool and anal bleeding.  Genitourinary: Positive for dysuria, urgency, frequency and vaginal pain. Negative for vaginal bleeding and vaginal discharge.  Musculoskeletal: Negative.   Skin: Negative.   Neurological: Negative.     Allergies  Review of patient's allergies indicates no known allergies.  Home Medications   Current Outpatient Rx  Name  Route  Sig  Dispense  Refill  . CINNAMON PO   Oral   Take 2 tablets by mouth daily.         [name] gabapentin (NEURONTIN) 100 MG capsule   Oral   Take 100 mg by mouth 3 (three) times daily.         . insulin aspart protamine- aspart (NOVOLOG MIX 70/30) (70-30) 100 UNIT/ML injection   Subcutaneous   Inject 0.6 mLs (60 Units total) into the skin 2 (two) times daily with a meal.   10 mL   12   . lisinopril (PRINIVIL,ZESTRIL) 20 MG tablet   Oral   Take 0.5 tablets (10 mg total) by mouth daily.   30 tablet   0   . nystatin cream (MYCOSTATIN)      Apply to affected area 2 times daily   30 g   0   . sitaGLIPtin-metformin (JANUMET) 50-1000 MG per tablet   Oral   Take 1 tablet by mouth 2 (two) times daily with a meal.   60 tablet   0   . ciprofloxacin (  CIPRO) 500 MG tablet   Oral   Take 1 tablet (500 mg total) by mouth 2 (two) times daily.   6 tablet   0   . HYDROcodone-acetaminophen (NORCO/VICODIN) 5-325 MG per tablet   Oral   Take 1 tablet by mouth every 4 (four) hours as needed for pain.   6 tablet   0   . metroNIDAZOLE (FLAGYL) 500 MG tablet   Oral   Take 1 tablet (500 mg total) by mouth 2 (two) times daily.   14 tablet   0   . ondansetron (ZOFRAN ODT) 4 MG disintegrating tablet   Oral   Take 1 tablet (4 mg total) by mouth every 8 (eight) hours as needed for nausea.   10 tablet   0    BP 151/92  Pulse 108  Temp(Src) 99.5 F (37.5 C) (Oral)  Resp 22  Ht 5\' 9"  (1.753 m)   Wt 282 lb (127.914 kg)  BMI 41.63 kg/m2  SpO2 96%  LMP 01/17/2013 Physical Exam  Constitutional: She is oriented to person, place, and time. She appears well-developed and well-nourished. No distress.  HENT:  Head: Normocephalic and atraumatic.  Right Ear: External ear normal.  Left Ear: External ear normal.  Nose: Nose normal.  Mouth/Throat: Oropharynx is clear and moist. No oropharyngeal exudate.  Eyes: Conjunctivae and EOM are normal.  Neck: Normal range of motion. Neck supple.  Cardiovascular: Normal rate, regular rhythm and normal heart sounds.   Pulmonary/Chest: Effort normal and breath sounds normal. No respiratory distress.  Abdominal: Soft. Bowel sounds are normal. She exhibits no distension. There is no tenderness. There is no rebound and no guarding.  Obese abdomen  Genitourinary: Cervix exhibits no motion tenderness. Right adnexum displays no fullness. Left adnexum displays no fullness. No erythema or bleeding around the vagina. No foreign body around the vagina. No signs of injury around the vagina. Vaginal discharge found.  Musculoskeletal: Normal range of motion. She exhibits no edema.  Lymphadenopathy:    She has no cervical adenopathy.  Neurological: She is alert and oriented to person, place, and time.  Skin: Skin is warm and dry. She is not diaphoretic.   Exam performed by Francee Piccolo L,  exam chaperoned Date: 02/01/2013 Pelvic exam: normal external genitalia without evidence of trauma. VULVA: normal appearing vulva with no masses, tenderness or lesion. VAGINA: normal appearing vagina with normal color and discharge, no lesions. CERVIX: normal appearing cervix without lesions, cervical motion tenderness absent, cervical os closed with out purulent discharge; vaginal discharge - copious and green, Wet prep and DNA probe for chlamydia and GC obtained.   ADNEXA: normal adnexa in size, nontender and no masses UTERUS: uterus is normal size, shape, consistency  and nontender.     ED Course  Procedures (including critical care time) Medications  ondansetron (ZOFRAN-ODT) disintegrating tablet 8 mg (8 mg Oral Given 02/01/13 2039)  azithromycin (ZITHROMAX) powder 1 g (1 g Oral Given 02/01/13 2208)  cefTRIAXone (ROCEPHIN) injection 250 mg (250 mg Intramuscular Given 02/01/13 2212)  ketorolac (TORADOL) 30 MG/ML injection 30 mg (30 mg Intramuscular Given 02/01/13 2213)  lidocaine (PF) (XYLOCAINE) 1 % injection (5 mLs  Given 02/01/13 2212)    Labs Review Labs Reviewed  WET PREP, GENITAL - Abnormal; Notable for the following:    Clue Cells Wet Prep HPF POC MODERATE (*)    WBC, Wet Prep HPF POC MODERATE (*)    All other components within normal limits  URINALYSIS, ROUTINE W REFLEX MICROSCOPIC - Abnormal; Notable  for the following:    Glucose, UA >1000 (*)    Leukocytes, UA TRACE (*)    All other components within normal limits  URINE MICROSCOPIC-ADD ON - Abnormal; Notable for the following:    Squamous Epithelial / LPF FEW (*)    Bacteria, UA FEW (*)    All other components within normal limits  POCT I-STAT, CHEM 8 - Abnormal; Notable for the following:    BUN <3 (*)    Glucose, Bld 286 (*)    All other components within normal limits  GC/CHLAMYDIA PROBE AMP  URINE CULTURE  PREGNANCY, URINE   Imaging Review No results found.  EKG Interpretation   None       MDM   1. UTI (urinary tract infection)   2. Bacterial vaginosis   3. Glycosuria     Afebrile, NAD, non-toxic appearing, AAOx4.   1) Glycosuria: No ketones noted in urine. ISTAT Chem 8 obtained. No AG. No concern for DKA. Glucose noted to bed 286.  Advised patient to use at home medications as prescribed for better control on hyperglycemia.   2) Vaginal pain: Patient to be discharged with instructions to follow up with OBGYN. Pt understands GC/Chlamydia cultures pending and that they will need to inform all sexual partners within the last 6 months if results return positive.  Pt has been treated prophylacticly with azithromycin and rocephin due to pts history, pelvic exam, and wet prep with increased WBCs. Pt advised that she will receive a call in 48 hours if the test is positive and to refrain from sexual activity for 48 hours. If the test is positive, pt is advised to refrain from sexual activity for 10 days for the medicine to take effect.  Pt not concerning for PID because hemodynamically stable and no cervical motion tenderness on pelvic exam. Pt has also been treated with flagyl for Bacterial Vaginosis. Pt has been advised to not drink alcohol while on this medication.   3) UTI: Pt has been diagnosed with a UTI. Pt is afebrile, no CVA tenderness, normotensive, and denies N/V. Pt to be dc home with antibiotics and instructions to follow up with PCP if symptoms persist.  Return precuations discussed. Patient is agreeable to plan. Patient is stable at time of discharge  Patient d/w with Dr. Rubin Payor, agrees with plan.          Jeannetta Ellis, PA-C 02/01/13 2254

## 2013-02-01 NOTE — ED Notes (Addendum)
Pt to ED c/o perineal itching, swelling and pain as well as burning on urination.  Pt states her clitoris is hard and very painful since last night.  Denies recent sexual activity.  States she was given medicine here and feels it is a reaction to it.  Recently tx for a yeast infection.

## 2013-02-02 LAB — GC/CHLAMYDIA PROBE AMP
CT Probe RNA: NEGATIVE
GC Probe RNA: NEGATIVE

## 2013-02-02 NOTE — ED Notes (Signed)
Chart review.

## 2013-02-03 LAB — URINE CULTURE: Colony Count: >=100000

## 2013-03-12 ENCOUNTER — Ambulatory Visit
Admission: RE | Admit: 2013-03-12 | Discharge: 2013-03-12 | Disposition: A | Discharge status: Home / Self Care | Payer: BC Managed Care – PPO | Source: Ambulatory Visit | Attending: Internal Medicine | Admitting: Internal Medicine

## 2013-03-12 DIAGNOSIS — Z1231 Encounter for screening mammogram for malignant neoplasm of breast: Secondary | ICD-10-CM

## 2013-05-09 ENCOUNTER — Encounter (HOSPITAL_COMMUNITY): Payer: Self-pay | Admitting: Emergency Medicine

## 2013-05-09 ENCOUNTER — Emergency Department (HOSPITAL_COMMUNITY): Payer: BC Managed Care – PPO

## 2013-05-09 ENCOUNTER — Emergency Department (HOSPITAL_COMMUNITY)
Admission: EM | Admit: 2013-05-09 | Discharge: 2013-05-09 | Disposition: A | Discharge status: Home / Self Care | Payer: BC Managed Care – PPO | Attending: Emergency Medicine | Admitting: Emergency Medicine

## 2013-05-09 DIAGNOSIS — E669 Obesity, unspecified: Secondary | ICD-10-CM | POA: Insufficient documentation

## 2013-05-09 DIAGNOSIS — E119 Type 2 diabetes mellitus without complications: Secondary | ICD-10-CM | POA: Insufficient documentation

## 2013-05-09 DIAGNOSIS — R079 Chest pain, unspecified: Secondary | ICD-10-CM | POA: Insufficient documentation

## 2013-05-09 DIAGNOSIS — Z794 Long term (current) use of insulin: Secondary | ICD-10-CM | POA: Insufficient documentation

## 2013-05-09 DIAGNOSIS — I10 Essential (primary) hypertension: Secondary | ICD-10-CM | POA: Insufficient documentation

## 2013-05-09 DIAGNOSIS — Z79899 Other long term (current) drug therapy: Secondary | ICD-10-CM | POA: Insufficient documentation

## 2013-05-09 LAB — CBC WITH DIFFERENTIAL/PLATELET
BASOS PCT: 0 % (ref 0–1)
Basophils Absolute: 0 10*3/uL (ref 0.0–0.1)
Eosinophils Absolute: 0.1 10*3/uL (ref 0.0–0.7)
Eosinophils Relative: 1 % (ref 0–5)
HCT: 33.9 % — ABNORMAL LOW (ref 36.0–46.0)
Hemoglobin: 11.4 g/dL — ABNORMAL LOW (ref 12.0–15.0)
Lymphocytes Relative: 43 % (ref 12–46)
Lymphs Abs: 3.5 10*3/uL (ref 0.7–4.0)
MCH: 28.3 pg (ref 26.0–34.0)
MCHC: 33.6 g/dL (ref 30.0–36.0)
MCV: 84.1 fL (ref 78.0–100.0)
Monocytes Absolute: 0.4 10*3/uL (ref 0.1–1.0)
Monocytes Relative: 4 % (ref 3–12)
NEUTROS ABS: 4.2 10*3/uL (ref 1.7–7.7)
NEUTROS PCT: 51 % (ref 43–77)
Platelets: 360 10*3/uL (ref 150–400)
RBC: 4.03 MIL/uL (ref 3.87–5.11)
RDW: 12.5 % (ref 11.5–15.5)
WBC: 8.1 10*3/uL (ref 4.0–10.5)

## 2013-05-09 LAB — BASIC METABOLIC PANEL
BUN: 10 mg/dL (ref 6–23)
CO2: 25 mEq/L (ref 19–32)
Calcium: 8.8 mg/dL (ref 8.4–10.5)
Chloride: 101 mEq/L (ref 96–112)
Creatinine, Ser: 0.6 mg/dL (ref 0.50–1.10)
GFR calc non Af Amer: >90 mL/min (ref >90)
Glucose, Bld: 90 mg/dL (ref 70–99)
POTASSIUM: 3.8 meq/L (ref 3.7–5.3)
SODIUM: 138 meq/L (ref 137–147)

## 2013-05-09 LAB — POCT I-STAT TROPONIN I: TROPONIN I, POC: 0.01 ng/mL (ref 0.00–0.08)

## 2013-05-09 MED ORDER — CYCLOBENZAPRINE HCL 10 MG PO TABS: 10.0000 mg | ORAL_TABLET | Freq: Two times a day (BID) | ORAL | Status: DC | PRN

## 2013-05-09 MED ORDER — KETOROLAC TROMETHAMINE 60 MG/2ML IM SOLN
60.0000 mg | Freq: Once | INTRAMUSCULAR | Status: AC
  Administered 2013-05-09: 60 mg via INTRAMUSCULAR
  Filled 2013-05-09: qty 2

## 2013-05-09 MED ORDER — DIAZEPAM 5 MG/ML IJ SOLN
5.0000 mg | Freq: Once | INTRAMUSCULAR | Status: AC
  Administered 2013-05-09: 5 mg via INTRAMUSCULAR
  Filled 2013-05-09: qty 2

## 2013-05-09 NOTE — ED Notes (Signed)
Per pt, states she started having chest pain around 6 am, radiating to back-states she has been having chest pain on and off for 2 weeks-has not seen PCP

## 2013-05-09 NOTE — ED Provider Notes (Signed)
CSN: [id number]     Arrival date & time 05/09/13  0746 History   First MD Initiated Contact with Patient 05/09/13 [phone]     Chief Complaint  Patient presents with  . Chest Pain   (Consider location/radiation/quality/duration/timing/severity/associated sxs/prior Treatment) The history is provided by the patient.  Marilyn [name] is a 48 y.o. female hx of DM, HTN, HL here with chest pain. Intermittent left-sided chest pain for the last 2 weeks. She states that is not exertional or worse with movement or worse with laying down. This morning she woke up and pain is more constant. His left-sided and denies any radiation. Denies any shortness of breath. She denies smoking or pregnancy. No history of CAD.     Past Medical History  Diagnosis Date  . Diabetes mellitus   . Hypertension   . Hypercholesteremia    Past Surgical History  Procedure Laterality Date  . Cesarean section    . Tubal ligation     No family history on file. History  Substance Use Topics  . Smoking status: Never Smoker   . Smokeless tobacco: Not on file  . Alcohol Use: No   OB History   Grav Para Term Preterm Abortions TAB SAB Ect Mult Living                 Review of Systems  Cardiovascular: Positive for chest pain.  All other systems reviewed and are negative.    Allergies  Review of patient's allergies indicates no known allergies.  Home Medications   Current Outpatient Rx  Name  Route  Sig  Dispense  Refill  . gabapentin (NEURONTIN) 300 MG capsule   Oral   Take 300 mg by mouth 3 (three) times daily.         [name] ibuprofen (ADVIL,MOTRIN) 200 MG tablet   Oral   Take 400 mg by mouth every 6 (six) hours as needed.         . insulin aspart protamine- aspart (NOVOLOG MIX 70/30) (70-30) 100 UNIT/ML injection   Subcutaneous   Inject 0.6 mLs (60 Units total) into the skin 2 (two) times daily with a meal.   10 mL   12   . lisinopril (PRINIVIL,ZESTRIL) 20 MG tablet   Oral   Take 0.5 tablets (10 mg  total) by mouth daily.   30 tablet   0   . sitaGLIPtin-metformin (JANUMET) 50-1000 MG per tablet   Oral   Take 1 tablet by mouth 2 (two) times daily with a meal.   60 tablet   0    BP 152/90  Pulse 100  Temp(Src) 97.9 F (36.6 C) (Oral)  Resp 14  SpO2 99%  LMP 04/19/2013 Physical Exam  Nursing note and vitals reviewed. Constitutional: She is oriented to person, place, and time. She appears well-nourished.  Obese, slightly uncomfortable   HENT:  Head: Normocephalic.  Mouth/Throat: Oropharynx is clear and moist.  Eyes: Conjunctivae and EOM are normal. Pupils are equal, round, and reactive to light.  Neck: Normal range of motion. Neck supple.  Cardiovascular: Normal rate, regular rhythm and normal heart sounds.   Pulmonary/Chest: Effort normal and breath sounds normal. No respiratory distress. She has no wheezes. She has no rales.  + reproducible L sided chest tenderness   Abdominal: Soft. Bowel sounds are normal. She exhibits no distension. There is no tenderness. There is no rebound and no guarding.  Musculoskeletal: Normal range of motion. She exhibits no edema.  Neurological: She is alert and  oriented to person, place, and time. No cranial nerve deficit.  Skin: Skin is warm and dry.  Psychiatric: She has a normal mood and affect. Her behavior is normal. Judgment and thought content normal.    ED Course  Procedures (including critical care time) Labs Review Labs Reviewed  CBC WITH DIFFERENTIAL - Abnormal; Notable for the following:    Hemoglobin 11.4 (*)    HCT 33.9 (*)    All other components within normal limits  BASIC METABOLIC PANEL  POCT I-STAT TROPONIN I   Imaging Review Dg Chest 2 View  05/09/2013   CLINICAL DATA:  Left-sided chest pain.  EXAM: CHEST  2 VIEW  COMPARISON:  04/2011.  FINDINGS: Trachea is midline. Heart size normal. Lungs are clear. No pleural fluid.  IMPRESSION: No acute findings.   Electronically Signed   By: Lorin Picket M.D.   On:  05/09/2013 08:38    EKG Interpretation    Date/Time:  Wednesday May 09 2013 08:04:20 EST Ventricular Rate:  90 PR Interval:  138 QRS Duration: 74 QT Interval:  353 QTC Calculation: 432 R Axis:   40 Text Interpretation:  Sinus rhythm Borderline T abnormalities, inferior leads Baseline wander in lead(s) V2 No significant change since last tracing Confirmed by YAO  MD, DAVID [phone]) on 05/09/2013 8:10:08 AM            MDM  No diagnosis found. [name] is a 48 y.o. female here with L sided chest pain. Pain is reproducible, has been going on for 2 weeks, I think its likely MSK. She is diabetic so will get trop x 1. I doubt PE or dissection. Will get labs, give pain meds and reassess.   10:13 AM Pain improved with toradol, valium. Trop neg x 1, CXR nl. I think its likely muscle strain. Will d/c home with motrin, flexeril.    Wandra Arthurs, MD 05/09/13 [phone]

## 2013-05-09 NOTE — Discharge Instructions (Signed)
Take motrin 800 mg every 6 hrs for pain.   Take flexeril for muscle spasms.   Follow up with your doctor for stress test if your pain comes back.   Return to ER if you have severe pain, vomiting, shortness of breath.

## 2013-06-11 IMAGING — CR DG LUMBAR SPINE COMPLETE 4+V
5 series · 5 of 5 positions shown · non-contrast
Comparison: Bone window images from the CT abdomen and pelvis
05/18/2010.

CLINICAL DATA: Lifting injury to the low back approximately 1 week
ago.  Persistent low back pain radiating into the lower
extremities.

LUMBAR SPINE - COMPLETE 4+ VIEW

[t l-spine a.p.]
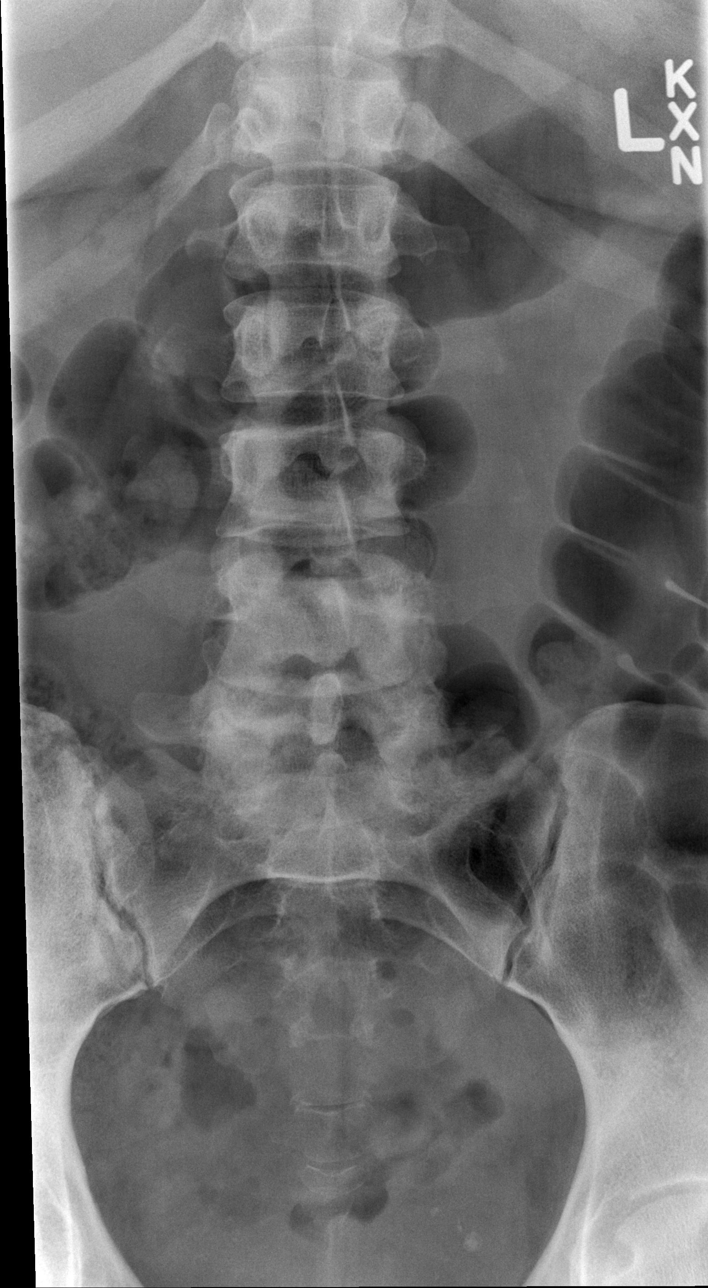

[t l-spine oblique exposure (1 of 2)]
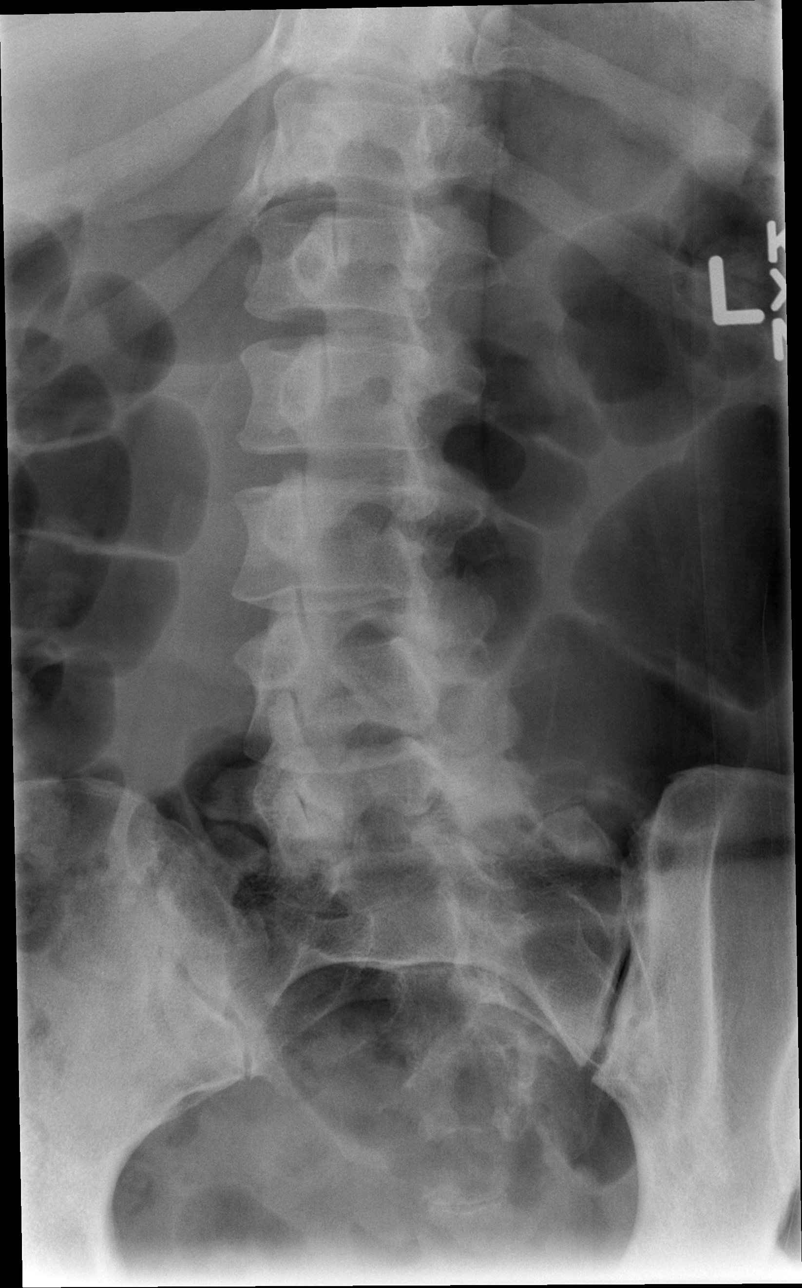

[t l-spine oblique exposure (2 of 2)]
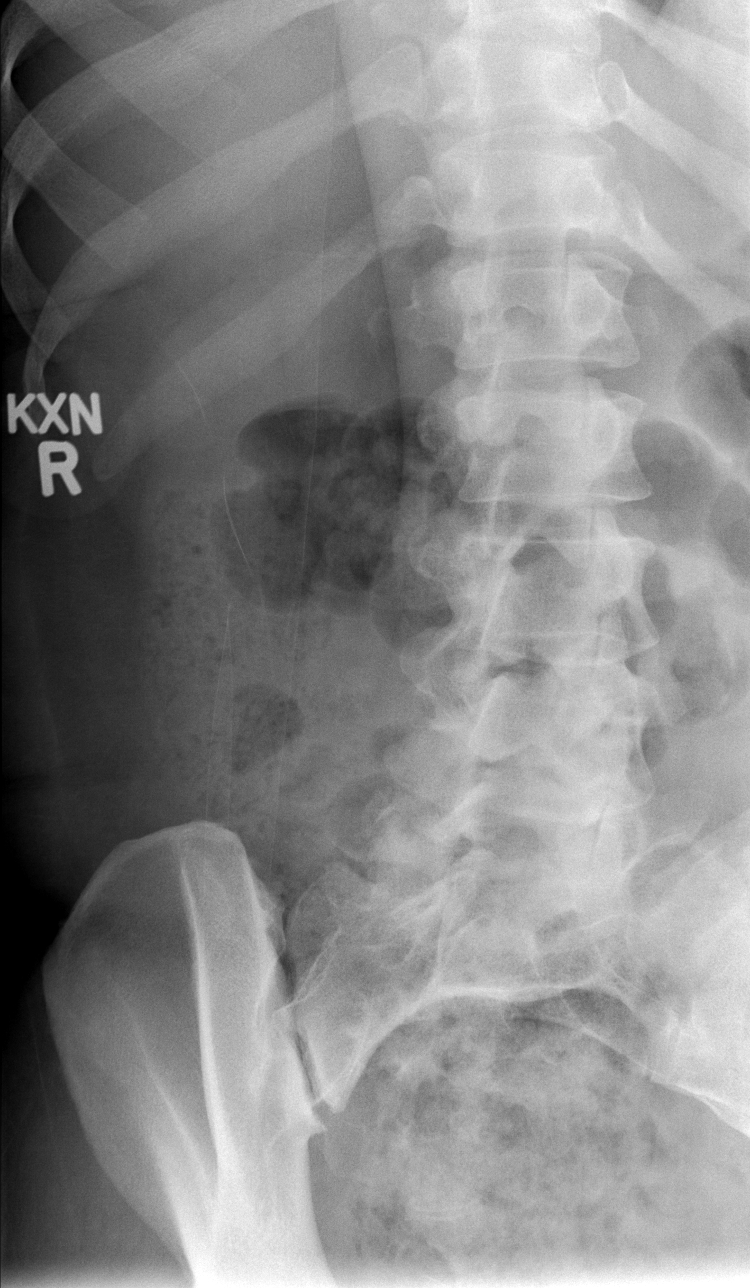

[t l-spine lat]
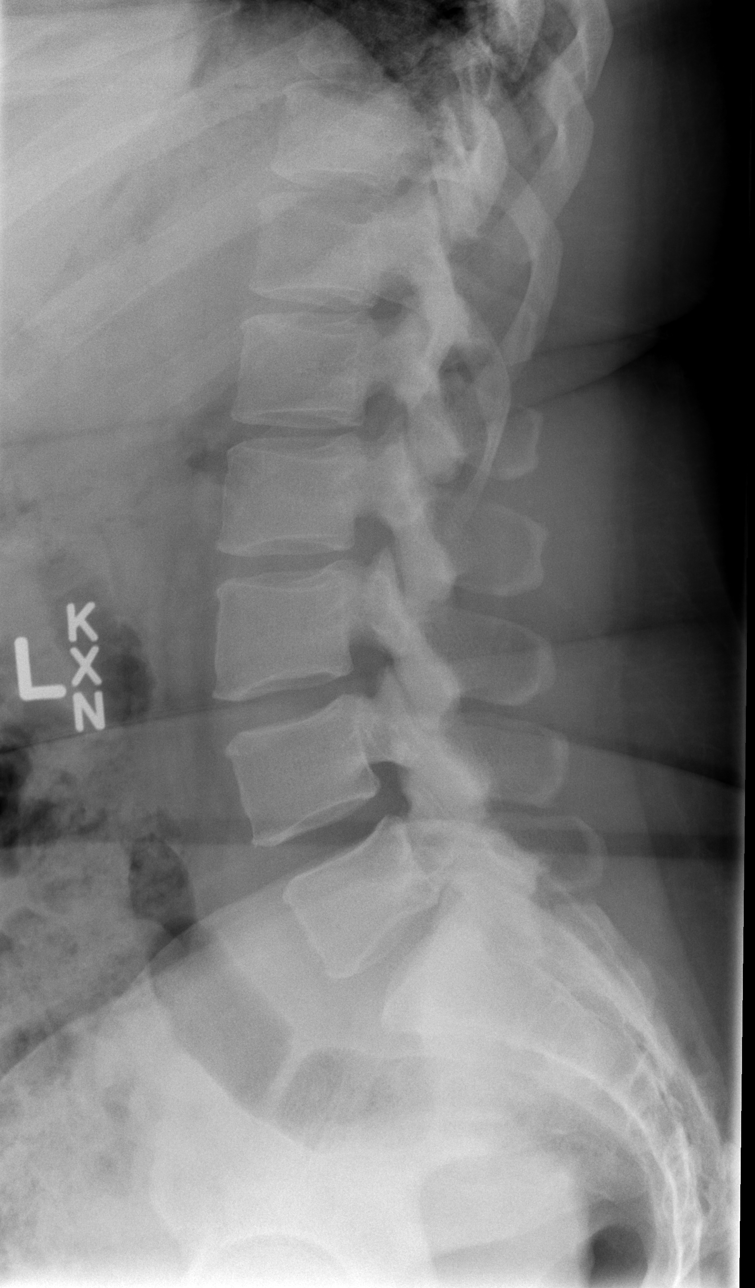

[t l-spine l5-s1 spot]
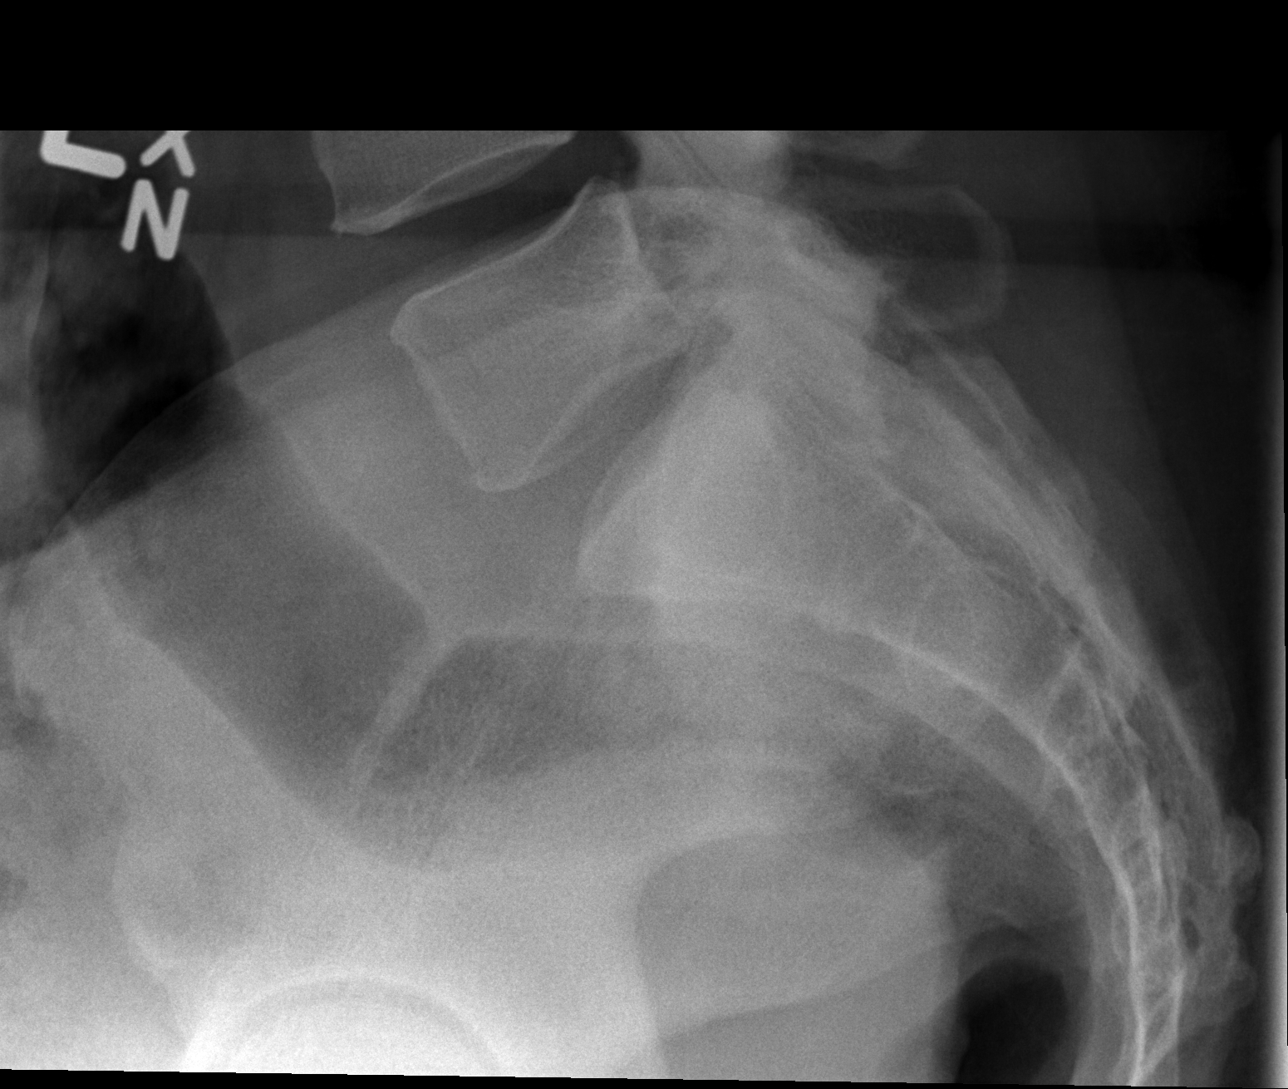

[5 of 5 positions shown; findings below may reference images not displayed]

FINDINGS: Five non-rib bearing lumbar vertebrae with anatomic
alignment.  No fractures.  Well-preserved disc spaces.  No pars
defects.  Mild facet degenerative changes at L5-S1, right greater
than left.  Visualized sacroiliac joints intact.
IMPRESSION: Mild facet degenerative changes at L5-S1, right greater than left.
Otherwise normal examination.

## 2013-10-03 ENCOUNTER — Emergency Department (HOSPITAL_COMMUNITY)
Admission: EM | Admit: 2013-10-03 | Discharge: 2013-10-04 | Disposition: A | Discharge status: Home / Self Care | Payer: BC Managed Care – PPO | Attending: Emergency Medicine | Admitting: Emergency Medicine

## 2013-10-03 ENCOUNTER — Encounter (HOSPITAL_COMMUNITY): Payer: Self-pay | Admitting: Emergency Medicine

## 2013-10-03 DIAGNOSIS — Z79899 Other long term (current) drug therapy: Secondary | ICD-10-CM | POA: Insufficient documentation

## 2013-10-03 DIAGNOSIS — I10 Essential (primary) hypertension: Secondary | ICD-10-CM | POA: Insufficient documentation

## 2013-10-03 DIAGNOSIS — L03119 Cellulitis of unspecified part of limb: Principal | ICD-10-CM

## 2013-10-03 DIAGNOSIS — Z794 Long term (current) use of insulin: Secondary | ICD-10-CM | POA: Insufficient documentation

## 2013-10-03 DIAGNOSIS — L02416 Cutaneous abscess of left lower limb: Secondary | ICD-10-CM

## 2013-10-03 DIAGNOSIS — R739 Hyperglycemia, unspecified: Secondary | ICD-10-CM

## 2013-10-03 DIAGNOSIS — L02419 Cutaneous abscess of limb, unspecified: Secondary | ICD-10-CM | POA: Insufficient documentation

## 2013-10-03 DIAGNOSIS — E119 Type 2 diabetes mellitus without complications: Secondary | ICD-10-CM | POA: Insufficient documentation

## 2013-10-03 LAB — CBC WITH DIFFERENTIAL/PLATELET
BASOS ABS: 0 10*3/uL (ref 0.0–0.1)
Basophils Relative: 0 % (ref 0–1)
Eosinophils Absolute: 0.1 10*3/uL (ref 0.0–0.7)
Eosinophils Relative: 1 % (ref 0–5)
HCT: 32.8 % — ABNORMAL LOW (ref 36.0–46.0)
Hemoglobin: 10.7 g/dL — ABNORMAL LOW (ref 12.0–15.0)
LYMPHS PCT: 29 % (ref 12–46)
Lymphs Abs: 3.1 10*3/uL (ref 0.7–4.0)
MCH: 27.2 pg (ref 26.0–34.0)
MCHC: 32.6 g/dL (ref 30.0–36.0)
MCV: 83.5 fL (ref 78.0–100.0)
Monocytes Absolute: 0.4 10*3/uL (ref 0.1–1.0)
Monocytes Relative: 4 % (ref 3–12)
Neutro Abs: 7.1 10*3/uL (ref 1.7–7.7)
Neutrophils Relative %: 66 % (ref 43–77)
PLATELETS: 327 10*3/uL (ref 150–400)
RBC: 3.93 MIL/uL (ref 3.87–5.11)
RDW: 12.9 % (ref 11.5–15.5)
WBC: 10.7 10*3/uL — ABNORMAL HIGH (ref 4.0–10.5)

## 2013-10-03 LAB — BASIC METABOLIC PANEL
ANION GAP: 14 (ref 5–15)
BUN: 7 mg/dL (ref 6–23)
CALCIUM: 8.7 mg/dL (ref 8.4–10.5)
CO2: 23 meq/L (ref 19–32)
Chloride: 100 mEq/L (ref 96–112)
Creatinine, Ser: 0.54 mg/dL (ref 0.50–1.10)
GFR calc Af Amer: >90 mL/min (ref >90)
GFR calc non Af Amer: >90 mL/min (ref >90)
Glucose, Bld: 434 mg/dL — ABNORMAL HIGH (ref 70–99)
Potassium: 4 mEq/L (ref 3.7–5.3)
SODIUM: 137 meq/L (ref 137–147)

## 2013-10-03 LAB — CBG MONITORING, ED: Glucose-Capillary: 397 mg/dL — ABNORMAL HIGH (ref 70–99)

## 2013-10-03 MED ORDER — OXYCODONE-ACETAMINOPHEN 5-325 MG PO TABS
1.0000 | ORAL_TABLET | Freq: Once | ORAL | Status: AC
  Administered 2013-10-03: 1 via ORAL
  Filled 2013-10-03: qty 1

## 2013-10-03 MED ORDER — SODIUM CHLORIDE 0.9 % IV BOLUS (SEPSIS)
1000.0000 mL | Freq: Once | INTRAVENOUS | Status: AC
  Administered 2013-10-04: 1000 mL via INTRAVENOUS

## 2013-10-03 MED ORDER — CLINDAMYCIN PHOSPHATE 600 MG/50ML IV SOLN
600.0000 mg | Freq: Once | INTRAVENOUS | Status: AC
  Administered 2013-10-03: 600 mg via INTRAVENOUS
  Filled 2013-10-03: qty 50

## 2013-10-03 MED ORDER — SODIUM CHLORIDE 0.9 % IV BOLUS (SEPSIS)
1000.0000 mL | Freq: Once | INTRAVENOUS | Status: AC
  Administered 2013-10-03: 1000 mL via INTRAVENOUS

## 2013-10-03 MED ORDER — INSULIN ASPART 100 UNIT/ML ~~LOC~~ SOLN
12.0000 [IU] | Freq: Once | SUBCUTANEOUS | Status: AC
  Administered 2013-10-03: 12 [IU] via SUBCUTANEOUS
  Filled 2013-10-03: qty 1

## 2013-10-03 NOTE — ED Provider Notes (Signed)
CSN: [id number]     Arrival date & time 10/03/13  1953 History   First MD Initiated Contact with Patient 10/03/13 2100     Chief Complaint  Patient presents with  . Abscess     (Consider location/radiation/quality/duration/timing/severity/associated sxs/prior Treatment) HPI Marilyn [name] is a 48 y.o. female who presents to emergency department complaining of an abscess to her left inner thigh. Patient states she noticed a "bump" 4 days ago, states it has gotten larger. States it is very tender to the touch. Has tried warm compresses with no relief. She denies any fever or chills. She is diabetic. Also reports vulvar itching and irritation, states feels burning when she wipes with a tissue. Denies any recent antibiotics. Denies vaginal discharge. Denies abdominal pain. No urinary symptoms.  Past Medical History  Diagnosis Date  . Diabetes mellitus   . Hypertension   . Hypercholesteremia    Past Surgical History  Procedure Laterality Date  . Cesarean section    . Tubal ligation     No family history on file. History  Substance Use Topics  . Smoking status: Never Smoker   . Smokeless tobacco: Never Used  . Alcohol Use: No   OB History   Grav Para Term Preterm Abortions TAB SAB Ect Mult Living                 Review of Systems  Constitutional: Negative for fever and chills.  Respiratory: Negative for cough, chest tightness and shortness of breath.   Cardiovascular: Negative for chest pain, palpitations and leg swelling.  Gastrointestinal: Negative for nausea, vomiting, abdominal pain and diarrhea.  Genitourinary: Negative for dysuria, flank pain, vaginal bleeding, vaginal discharge, vaginal pain and pelvic pain.  Musculoskeletal: Negative for myalgias, neck pain and neck stiffness.  Skin: Negative for rash.  Neurological: Negative for dizziness, weakness and headaches.  All other systems reviewed and are negative.     Allergies  Review of patient's allergies indicates  no known allergies.  Home Medications   Prior to Admission medications   Medication Sig Start Date End Date Taking? Authorizing Provider  cyclobenzaprine (FLEXERIL) 10 MG tablet Take 1 tablet (10 mg total) by mouth 2 (two) times daily as needed for muscle spasms. 05/09/13   Wandra Arthurs, MD  gabapentin (NEURONTIN) 300 MG capsule Take 300 mg by mouth 3 (three) times daily.    Historical Provider, MD  ibuprofen (ADVIL,MOTRIN) 200 MG tablet Take 400 mg by mouth every 6 (six) hours as needed.    Historical Provider, MD  insulin aspart protamine- aspart (NOVOLOG MIX 70/30) (70-30) 100 UNIT/ML injection Inject 0.6 mLs (60 Units total) into the skin 2 (two) times daily with a meal. 01/30/13   Ruthell Rummage Dammen, PA-C  lisinopril (PRINIVIL,ZESTRIL) 20 MG tablet Take 0.5 tablets (10 mg total) by mouth daily. 01/30/13   Martie Lee, PA-C  sitaGLIPtin-metformin (JANUMET) 50-1000 MG per tablet Take 1 tablet by mouth 2 (two) times daily with a meal. 01/30/13   Ruthell Rummage Dammen, PA-C   BP 153/89  Pulse 109  Temp(Src) 100 F (37.8 C) (Oral)  Resp 22  Ht 5\' 9"  (1.753 m)  Wt 289 lb (131.09 kg)  BMI 42.66 kg/m2  SpO2 97%  LMP 08/17/2013 Physical Exam  Nursing note and vitals reviewed. Constitutional: She is oriented to person, place, and time. She appears well-developed and well-nourished. No distress.  HENT:  Head: Normocephalic.  Eyes: Conjunctivae are normal.  Neck: Neck supple.  Cardiovascular: Normal rate, regular  rhythm and normal heart sounds.   Pulmonary/Chest: Effort normal and breath sounds normal. No respiratory distress. She has no wheezes. She has no rales.  Abdominal: Soft. Bowel sounds are normal. She exhibits no distension. There is no tenderness. There is no rebound.  Musculoskeletal: She exhibits no edema.  Neurological: She is alert and oriented to person, place, and time.  Skin: Skin is warm and dry.  3 x 3 cm area of induration to the left upper thigh, adjacent to the left labia  majora. Area is erythematous, tender to palpation. No significant surrounding erythema or swelling. No drainage.  Psychiatric: She has a normal mood and affect. Her behavior is normal.    ED Course  Procedures (including critical care time) Labs Review Labs Reviewed  CBC WITH DIFFERENTIAL - Abnormal; Notable for the following:    WBC 10.7 (*)    Hemoglobin 10.7 (*)    HCT 32.8 (*)    All other components within normal limits  BASIC METABOLIC PANEL - Abnormal; Notable for the following:    Glucose, Bld 434 (*)    All other components within normal limits  CBG MONITORING, ED - Abnormal; Notable for the following:    Glucose-Capillary 397 (*)    All other components within normal limits  CBG MONITORING, ED - Abnormal; Notable for the following:    Glucose-Capillary 330 (*)    All other components within normal limits    Imaging Review No results found.   EKG Interpretation None     INCISION AND DRAINAGE Performed by: Jeannett Senior A Consent: Verbal consent obtained. Risks and benefits: risks, benefits and alternatives were discussed Type: abscess  Body area: left upper thigh  Anesthesia: local infiltration  Incision was made with a scalpel.  Local anesthetic: lidocaine 2% w epinephrine  Anesthetic total: 3 ml  Complexity: complex Blunt dissection to break up loculations  Drainage: purulent  Drainage amount: moderate  Packing material: 1/4 in iodoform gauze  Patient tolerance: Patient tolerated the procedure well with no immediate complications.    MDM   Final diagnoses:  Abscess of left thigh  Hyperglycemia    Pt with abscess to the left upper thigh/ inguinal area. She is diabetic. Initially febrile at 100, tachycardic. She however denies any fever, chills, malaise. Will recheck, and check CBG.   CBG 397, will check labs to r/o dka. Fluids, insulin 12 units SQ ordered. Pt stated she did not take any of her diabetes medications today.   12:43  AM Pt received 1L of NS, 12 units of insulin SQ, CBG 330. Will give another 8 units of insulin, another bag of IV saline. Pt also received 600mg  of clindamycing IV for infection. Pt is non toxic appearing, believe is stable for d/c home and outpatient treatment with antibiotics. Discussed importance of blood sugar control.   Signed out to PA Geiple at shift change.   Filed Vitals:   10/03/13 2018 10/03/13 2029 10/03/13 2220  BP: 153/89  124/71  Pulse: 109  94  Temp: 100 F (37.8 C)    TempSrc: Oral    Resp: 22  18  Height:  5\' 9"  (1.753 m)   Weight: 289 lb (131.09 kg) 289 lb (131.09 kg)   SpO2: 97%  99%     Estle Huguley A Navi Ewton, PA-C 10/04/13 0107

## 2013-10-03 NOTE — ED Notes (Signed)
Pt states that she noticed a "bump" near her vagina on Sunday. It has since grown in size and became more painful.

## 2013-10-03 NOTE — ED Notes (Signed)
IV insertion/ blood draw attempted x 2, pt jerked back both times and IV came out. Another RN to attempt.

## 2013-10-04 LAB — CBG MONITORING, ED: GLUCOSE-CAPILLARY: 330 mg/dL — AB (ref 70–99)

## 2013-10-04 MED ORDER — FLUCONAZOLE 150 MG PO TABS: 150.0000 mg | ORAL_TABLET | Freq: Every day | ORAL | Status: AC

## 2013-10-04 MED ORDER — CLINDAMYCIN HCL 150 MG PO CAPS: 300.0000 mg | ORAL_CAPSULE | Freq: Three times a day (TID) | ORAL | Status: DC

## 2013-10-04 MED ORDER — HYDROCODONE-ACETAMINOPHEN 5-325 MG PO TABS: 2.0000 | ORAL_TABLET | ORAL | Status: DC | PRN

## 2013-10-04 MED ORDER — INSULIN ASPART 100 UNIT/ML ~~LOC~~ SOLN
8.0000 [IU] | Freq: Once | SUBCUTANEOUS | Status: AC
  Administered 2013-10-04: 8 [IU] via SUBCUTANEOUS
  Filled 2013-10-04: qty 1

## 2013-10-04 MED ORDER — INSULIN ASPART 100 UNIT/ML ~~LOC~~ SOLN: 10.0000 [IU] | Freq: Once | SUBCUTANEOUS | Status: DC

## 2013-10-04 MED ORDER — OXYCODONE-ACETAMINOPHEN 5-325 MG PO TABS
1.0000 | ORAL_TABLET | Freq: Once | ORAL | Status: AC
  Administered 2013-10-04: 1 via ORAL
  Filled 2013-10-04: qty 1

## 2013-10-04 NOTE — ED Provider Notes (Signed)
Medical screening examination/treatment/procedure(s) were performed by non-physician practitioner and as supervising physician I was immediately available for consultation/collaboration.  Leota Jacobsen, MD 10/04/13 [phone]

## 2013-10-04 NOTE — Discharge Instructions (Signed)
Apply warm compresses to the area. Make sure to take antibiotics until all gone. Take diflucan after finish antibiotics for vaginal yeast infection. Take pain medications as prescribed. Make sure to take your diabetes medications, keep close eye on your blood sugars. Return in 2 days or follow up with your doctor for recheck. Return sooner if worsening or develop high fever.    Abscess Care After An abscess (also called a boil or furuncle) is an infected area that contains a collection of pus. Signs and symptoms of an abscess include pain, tenderness, redness, or hardness, or you may feel a moveable soft area under your skin. An abscess can occur anywhere in the body. The infection may spread to surrounding tissues causing cellulitis. A cut (incision) by the surgeon was made over your abscess and the pus was drained out. Gauze may have been packed into the space to provide a drain that will allow the cavity to heal from the inside outwards. The boil may be painful for 5 to 7 days. Most people with a boil do not have high fevers. Your abscess, if seen early, may not have localized, and may not have been lanced. If not, another appointment may be required for this if it does not get better on its own or with medications. HOME CARE INSTRUCTIONS   Only take over-the-counter or prescription medicines for pain, discomfort, or fever as directed by your caregiver.  When you bathe, soak and then remove gauze or iodoform packs at least daily or as directed by your caregiver. You may then wash the wound gently with mild soapy water. Repack with gauze or do as your caregiver directs. SEEK IMMEDIATE MEDICAL CARE IF:   You develop increased pain, swelling, redness, drainage, or bleeding in the wound site.  You develop signs of generalized infection including muscle aches, chills, fever, or a general ill feeling.  An oral temperature above 102 F (38.9 C) develops, not controlled by medication. See your  caregiver for a recheck if you develop any of the symptoms described above. If medications (antibiotics) were prescribed, take them as directed. Document Released: 10/08/2004 Document Revised: 06/14/2011 Document Reviewed: 06/05/2007 [facility] Patient Information 2015 [place], [place]. This information is not intended to replace advice given to you by your health care provider. Make sure you discuss any questions you have with your health care provider.

## 2014-06-13 ENCOUNTER — Other Ambulatory Visit: Payer: Self-pay

## 2014-06-13 DIAGNOSIS — Z1231 Encounter for screening mammogram for malignant neoplasm of breast: Secondary | ICD-10-CM

## 2014-06-19 ENCOUNTER — Ambulatory Visit
Admission: RE | Admit: 2014-06-19 | Discharge: 2014-06-19 | Disposition: A | Discharge status: Home / Self Care | Payer: BC Managed Care – PPO | Source: Ambulatory Visit

## 2014-06-19 DIAGNOSIS — Z1231 Encounter for screening mammogram for malignant neoplasm of breast: Secondary | ICD-10-CM

## 2014-10-07 ENCOUNTER — Encounter (HOSPITAL_COMMUNITY): Payer: Self-pay | Admitting: Physical Medicine and Rehabilitation

## 2014-10-07 ENCOUNTER — Emergency Department (HOSPITAL_COMMUNITY)
Admission: EM | Admit: 2014-10-07 | Discharge: 2014-10-07 | Disposition: A | Discharge status: Home / Self Care | Payer: BC Managed Care – PPO | Attending: Emergency Medicine | Admitting: Emergency Medicine

## 2014-10-07 ENCOUNTER — Emergency Department (HOSPITAL_COMMUNITY): Payer: BC Managed Care – PPO

## 2014-10-07 DIAGNOSIS — M542 Cervicalgia: Secondary | ICD-10-CM

## 2014-10-07 DIAGNOSIS — I10 Essential (primary) hypertension: Secondary | ICD-10-CM | POA: Diagnosis not present

## 2014-10-07 DIAGNOSIS — R0789 Other chest pain: Secondary | ICD-10-CM | POA: Diagnosis not present

## 2014-10-07 DIAGNOSIS — M549 Dorsalgia, unspecified: Secondary | ICD-10-CM | POA: Diagnosis not present

## 2014-10-07 DIAGNOSIS — Z7982 Long term (current) use of aspirin: Secondary | ICD-10-CM | POA: Insufficient documentation

## 2014-10-07 DIAGNOSIS — Z79899 Other long term (current) drug therapy: Secondary | ICD-10-CM | POA: Diagnosis not present

## 2014-10-07 DIAGNOSIS — D649 Anemia, unspecified: Secondary | ICD-10-CM | POA: Diagnosis not present

## 2014-10-07 DIAGNOSIS — E1165 Type 2 diabetes mellitus with hyperglycemia: Secondary | ICD-10-CM

## 2014-10-07 DIAGNOSIS — M62838 Other muscle spasm: Secondary | ICD-10-CM

## 2014-10-07 DIAGNOSIS — E785 Hyperlipidemia, unspecified: Secondary | ICD-10-CM | POA: Diagnosis not present

## 2014-10-07 DIAGNOSIS — R079 Chest pain, unspecified: Secondary | ICD-10-CM

## 2014-10-07 DIAGNOSIS — Z794 Long term (current) use of insulin: Secondary | ICD-10-CM | POA: Diagnosis not present

## 2014-10-07 DIAGNOSIS — R202 Paresthesia of skin: Secondary | ICD-10-CM | POA: Diagnosis not present

## 2014-10-07 DIAGNOSIS — M25512 Pain in left shoulder: Secondary | ICD-10-CM | POA: Diagnosis present

## 2014-10-07 LAB — CBC WITH DIFFERENTIAL/PLATELET
Basophils Absolute: 0 10*3/uL (ref 0.0–0.1)
Basophils Relative: 0 % (ref 0–1)
EOS ABS: 0.1 10*3/uL (ref 0.0–0.7)
EOS PCT: 1 % (ref 0–5)
HEMATOCRIT: 33.8 % — AB (ref 36.0–46.0)
Hemoglobin: 11 g/dL — ABNORMAL LOW (ref 12.0–15.0)
LYMPHS ABS: 2.9 10*3/uL (ref 0.7–4.0)
Lymphocytes Relative: 37 % (ref 12–46)
MCH: 26.8 pg (ref 26.0–34.0)
MCHC: 32.5 g/dL (ref 30.0–36.0)
MCV: 82.4 fL (ref 78.0–100.0)
Monocytes Absolute: 0.4 10*3/uL (ref 0.1–1.0)
Monocytes Relative: 5 % (ref 3–12)
Neutro Abs: 4.4 10*3/uL (ref 1.7–7.7)
Neutrophils Relative %: 57 % (ref 43–77)
Platelets: 327 10*3/uL (ref 150–400)
RBC: 4.1 MIL/uL (ref 3.87–5.11)
RDW: 12.7 % (ref 11.5–15.5)
WBC: 7.8 10*3/uL (ref 4.0–10.5)

## 2014-10-07 LAB — I-STAT TROPONIN, ED: TROPONIN I, POC: 0 ng/mL (ref 0.00–0.08)

## 2014-10-07 LAB — BASIC METABOLIC PANEL
Anion gap: 9 (ref 5–15)
BUN: 6 mg/dL (ref 6–20)
CALCIUM: 8.5 mg/dL — AB (ref 8.9–10.3)
CO2: 25 mmol/L (ref 22–32)
CREATININE: 0.62 mg/dL (ref 0.44–1.00)
Chloride: 104 mmol/L (ref 101–111)
GFR calc Af Amer: >60 mL/min (ref >60)
Glucose, Bld: 171 mg/dL — ABNORMAL HIGH (ref 65–99)
POTASSIUM: 3.8 mmol/L (ref 3.5–5.1)
Sodium: 138 mmol/L (ref 135–145)

## 2014-10-07 MED ORDER — GI COCKTAIL ~~LOC~~
30.0000 mL | Freq: Once | ORAL | Status: AC
  Administered 2014-10-07: 30 mL via ORAL
  Filled 2014-10-07: qty 30

## 2014-10-07 MED ORDER — NAPROXEN 500 MG PO TABS: 500.0000 mg | ORAL_TABLET | Freq: Two times a day (BID) | ORAL | Status: DC | PRN

## 2014-10-07 MED ORDER — HYDROCODONE-ACETAMINOPHEN 5-325 MG PO TABS: 1.0000 | ORAL_TABLET | Freq: Four times a day (QID) | ORAL | Status: DC | PRN

## 2014-10-07 MED ORDER — NITROGLYCERIN 0.4 MG SL SUBL: 0.4000 mg | SUBLINGUAL_TABLET | SUBLINGUAL | Status: DC | PRN

## 2014-10-07 MED ORDER — CYCLOBENZAPRINE HCL 10 MG PO TABS: 10.0000 mg | ORAL_TABLET | Freq: Three times a day (TID) | ORAL | Status: DC | PRN

## 2014-10-07 MED ORDER — CYCLOBENZAPRINE HCL 10 MG PO TABS
10.0000 mg | ORAL_TABLET | Freq: Once | ORAL | Status: AC
  Administered 2014-10-07: 10 mg via ORAL
  Filled 2014-10-07: qty 1

## 2014-10-07 MED ORDER — ASPIRIN 81 MG PO CHEW
324.0000 mg | CHEWABLE_TABLET | Freq: Once | ORAL | Status: AC
  Administered 2014-10-07: 324 mg via ORAL
  Filled 2014-10-07: qty 4

## 2014-10-07 MED ORDER — MORPHINE SULFATE 4 MG/ML IJ SOLN
4.0000 mg | Freq: Once | INTRAMUSCULAR | Status: AC
Start: 2014-10-07 — End: 2014-10-07
  Administered 2014-10-07: 4 mg via INTRAVENOUS
  Filled 2014-10-07: qty 1

## 2014-10-07 NOTE — Discharge Instructions (Signed)
Your pain is likely related to a muscle strain and spasm in your shoulder/neck. Take naprosyn as directed for inflammation and pain with norco for breakthrough pain and flexeril for muscle relaxation. Do not drive or operate machinery with pain medication or muscle relaxation use. Use heat to areas of soreness , no more than 20 minutes at a time every hour. Follow up with primary care physician for recheck of ongoing symptoms in the next 1 week. Return to ER for emergent changing or worsening of symptoms.     Muscle Cramps and Spasms Muscle cramps and spasms occur when a muscle or muscles tighten and you have no control over this tightening (involuntary muscle contraction). They are a common problem and can develop in any muscle. The most common place is in the calf muscles of the leg. Both muscle cramps and muscle spasms are involuntary muscle contractions, but they also have differences:   Muscle cramps are sporadic and painful. They may last a few seconds to a quarter of an hour. Muscle cramps are often more forceful and last longer than muscle spasms.  Muscle spasms may or may not be painful. They may also last just a few seconds or much longer. CAUSES  It is uncommon for cramps or spasms to be due to a serious underlying problem. In many cases, the cause of cramps or spasms is unknown. Some common causes are:   Overexertion.   Overuse from repetitive motions (doing the same thing over and over).   Remaining in a certain position for a long period of time.   Improper preparation, form, or technique while performing a sport or activity.   Dehydration.   Injury.   Side effects of some medicines.   Abnormally low levels of the salts and ions in your blood (electrolytes), especially potassium and calcium. This could happen if you are taking water pills (diuretics) or you are pregnant.  Some underlying medical problems can make it more likely to develop cramps or spasms. These  include, but are not limited to:   Diabetes.   Parkinson disease.   Hormone disorders, such as thyroid problems.   Alcohol abuse.   Diseases specific to muscles, joints, and bones.   Blood vessel disease where not enough blood is getting to the muscles.  HOME CARE INSTRUCTIONS   Stay well hydrated. Drink enough water and fluids to keep your urine clear or pale yellow.  It may be helpful to massage, stretch, and relax the affected muscle.  For tight or tense muscles, use a warm towel, heating pad, or hot shower water directed to the affected area.  If you are sore or have pain after a cramp or spasm, applying ice to the affected area may relieve discomfort.  Put ice in a plastic bag.  Place a towel between your skin and the bag.  Leave the ice on for 15-20 minutes, 03-04 times a day.  Medicines used to treat a known cause of cramps or spasms may help reduce their frequency or severity. Only take over-the-counter or prescription medicines as directed by your caregiver. SEEK MEDICAL CARE IF:  Your cramps or spasms get more severe, more frequent, or do not improve over time.  MAKE SURE YOU:   Understand these instructions.  Will watch your condition.  Will get help right away if you are not doing well or get worse. Document Released: 09/11/2001 Document Revised: 07/17/2012 Document Reviewed: 03/08/2012 [facility] Patient Information 2015 [place], [place]. This information is not intended to replace  advice given to you by your health care provider. Make sure you discuss any questions you have with your health care provider.  Musculoskeletal Pain Musculoskeletal pain is muscle and boney aches and pains. These pains can occur in any part of the body. Your caregiver may treat you without knowing the cause of the pain. They may treat you if blood or urine tests, X-rays, and other tests were normal.  CAUSES There is often not a definite cause or reason for these pains. These  pains may be caused by a type of germ (virus). The discomfort may also come from overuse. Overuse includes working out too hard when your body is not fit. Boney aches also come from weather changes. Bone is sensitive to atmospheric pressure changes. HOME CARE INSTRUCTIONS   Ask when your test results will be ready. Make sure you get your test results.  Only take over-the-counter or prescription medicines for pain, discomfort, or fever as directed by your caregiver. If you were given medications for your condition, do not drive, operate machinery or power tools, or sign legal documents for 24 hours. Do not drink alcohol. Do not take sleeping pills or other medications that may interfere with treatment.  Continue all activities unless the activities cause more pain. When the pain lessens, slowly resume normal activities. Gradually increase the intensity and duration of the activities or exercise.  During periods of severe pain, bed rest may be helpful. Lay or sit in any position that is comfortable.  Putting ice on the injured area.  Put ice in a bag.  Place a towel between your skin and the bag.  Leave the ice on for 15 to 20 minutes, 3 to 4 times a day.  Follow up with your caregiver for continued problems and no reason can be found for the pain. If the pain becomes worse or does not go away, it may be necessary to repeat tests or do additional testing. Your caregiver may need to look further for a possible cause. SEEK IMMEDIATE MEDICAL CARE IF:  You have pain that is getting worse and is not relieved by medications.  You develop chest pain that is associated with shortness or breath, sweating, feeling sick to your stomach (nauseous), or throw up (vomit).  Your pain becomes localized to the abdomen.  You develop any new symptoms that seem different or that concern you. MAKE SURE YOU:   Understand these instructions.  Will watch your condition.  Will get help right away if you are  not doing well or get worse. Document Released: 03/22/2005 Document Revised: 06/14/2011 Document Reviewed: 11/24/2012 [facility] Patient Information 2015 [place], [place]. This information is not intended to replace advice given to you by your health care provider. Make sure you discuss any questions you have with your health care provider.  Heat Therapy Heat therapy can help make painful, stiff muscles and joints feel better. Do not use heat on new injuries. Wait at least 48 hours after an injury to use heat. Do not use heat when you have aches or pains right after an activity. If you still have pain 3 hours after stopping the activity, then you may use heat. HOME CARE Wet heat pack  Soak a clean towel in warm water. Squeeze out the extra water.  Put the warm, wet towel in a plastic bag.  Place a thin, dry towel between your skin and the bag.  Put the heat pack on the area for 5 minutes, and check your skin. Your  skin may be pink, but it should not be red.  Leave the heat pack on the area for 15 to 30 minutes.  Repeat this every 2 to 4 hours while awake. Do not use heat while you are sleeping. Warm water bath  Fill a tub with warm water.  Place the affected body part in the tub.  Soak the area for 20 to 40 minutes.  Repeat as needed. Hot water bottle  Fill the water bottle half full with hot water.  Press out the extra air. Close the cap tightly.  Place a dry towel between your skin and the bottle.  Put the bottle on the area for 5 minutes, and check your skin. Your skin may be pink, but it should not be red.  Leave the bottle on the area for 15 to 30 minutes.  Repeat this every 2 to 4 hours while awake. Electric heating pad  Place a dry towel between your skin and the heating pad.  Set the heating pad on low heat.  Put the heating pad on the area for 10 minutes, and check your skin. Your skin may be pink, but it should not be red.  Leave the heating pad on the area  for 20 to 40 minutes.  Repeat this every 2 to 4 hours while awake.  Do not lie on the heating pad.  Do not fall asleep while using the heating pad.  Do not use the heating pad near water. GET HELP RIGHT AWAY IF:  You get blisters or red skin.  Your skin is puffy (swollen), or you lose feeling (numbness) in the affected area.  You have any new problems.  Your problems are getting worse.  You have any questions or concerns. If you have any problems, stop using heat therapy until you see your doctor. MAKE SURE YOU:  Understand these instructions.  Will watch your condition.  Will get help right away if you are not doing well or get worse. Document Released: 06/14/2011 Document Reviewed: 05/15/2013 [facility] Patient Information 2015 [place]. This information is not intended to replace advice given to you by your health care provider. Make sure you discuss any questions you have with your health care provider.

## 2014-10-07 NOTE — ED Provider Notes (Signed)
CSN: [id number]     Arrival date & time 10/07/14  0941 History   First MD Initiated Contact with Patient 10/07/14 [id number]     Chief Complaint  Patient presents with  . Neck Pain  . Back Pain  . Shoulder Pain     (Consider location/radiation/quality/duration/timing/severity/associated sxs/prior Treatment) HPI Comments: Marilyn [name] is a 49 y.o. female with a PMHx of DM2, HTN, and HLD, who presents to the ED with complaints of gradual onset left trapezius/neck pain beginning yesterday. She reports pain is 10/10 constant sore and radiating to the left chest, worse with sitting and having her arm hanging down, and improved with having her arm up. Associated symptoms include the chest pain radiating from her neck which is unchanged with exertion or breathing, and lip tingling that she noticed when she tried to eat [employer] this morning. This morning she was short of breath but that has resolved. She denies any fevers, chills, ongoing shortness of breath, abdominal pain, nausea, vomiting, diarrhea, constipation, melena, hematochezia, dysuria, hematuria, vaginal bleeding or discharge, numbness, weakness, claudication, orthopnea, cough, wheezing, recent travel/surgery/immobilization, estrogen use, history of cancer, lightheadedness, dizziness, or diaphoresis. She is a nonsmoker. No family or personal cardiac history. No recent trauma or known injury to cause her pain.   Patient is a 48 y.o. female presenting with neck pain, back pain, and shoulder pain. The history is provided by the patient. No language interpreter was used.  Neck Pain Pain location:  L side Quality: sore. Pain radiates to:  L shoulder (and L chest) Pain severity:  Moderate Pain is:  Same all the time Onset quality:  Gradual Duration:  1 day Timing:  Constant Progression:  Unchanged Chronicity:  New Context: not recent injury   Relieved by:  Position (holding arm above her head, and sitting) Exacerbated by: having arm hanging  down. Ineffective treatments:  None tried Associated symptoms: chest pain (radiating from L trapezius) and tingling (lips bilaterally)   Associated symptoms: no fever, no numbness, no paresis, no visual change and no weakness   Back Pain Associated symptoms: chest pain (radiating from L trapezius) and tingling (lips bilaterally)   Associated symptoms: no abdominal pain, no dysuria, no fever, no numbness and no weakness   Shoulder Pain Associated symptoms: neck pain (L trapezius)   Associated symptoms: no back pain and no fever     Past Medical History  Diagnosis Date  . Diabetes mellitus   . Hypertension   . Hypercholesteremia    Past Surgical History  Procedure Laterality Date  . Cesarean section    . Tubal ligation     No family history on file. History  Substance Use Topics  . Smoking status: Never Smoker   . Smokeless tobacco: Never Used  . Alcohol Use: No   OB History    No data available     Review of Systems  Constitutional: Negative for fever, chills and diaphoresis.  Respiratory: Negative for cough, shortness of breath (none ongoing) and wheezing.   Cardiovascular: Positive for chest pain (radiating from L trapezius). Negative for leg swelling.  Gastrointestinal: Negative for nausea, vomiting, abdominal pain, diarrhea, constipation and blood in stool.  Genitourinary: Negative for dysuria, hematuria, vaginal bleeding and vaginal discharge.  Musculoskeletal: Positive for myalgias (L trapezius) and neck pain (L trapezius). Negative for back pain and arthralgias.  Skin: Negative for color change.  Allergic/Immunologic: Positive for immunocompromised state (diabetic).  Neurological: Positive for tingling (lips bilaterally). Negative for weakness, light-headedness and numbness.       +  tingling in b/l lips  Psychiatric/Behavioral: Negative for confusion.   10 Systems reviewed and are negative for acute change except as noted in the HPI.    Allergies  Review of  patient's allergies indicates no known allergies.  Home Medications   Prior to Admission medications   Medication Sig Start Date End Date Taking? Authorizing Provider  aspirin 81 MG tablet Take 81 mg by mouth daily.   Yes Historical Provider, MD  cetirizine (ZYRTEC) 10 MG tablet Take 10 mg by mouth daily.   Yes Historical Provider, MD  gabapentin (NEURONTIN) 300 MG capsule Take 300 mg by mouth 3 (three) times daily.   Yes Historical Provider, MD  Insulin Degludec (TRESIBA FLEXTOUCH) 200 UNIT/ML SOPN Inject 100 Units into the skin every morning.   Yes Historical Provider, MD  Liraglutide (VICTOZA [place]aceton) Inject 1.8 mg into the skin every morning.   Yes Historical Provider, MD  losartan-hydrochlorothiazide (HYZAAR) 100-12.5 MG per tablet Take 1 tablet by mouth daily.   Yes Historical Provider, MD  simvastatin (ZOCOR) 20 MG tablet Take 20 mg by mouth daily at 6 PM.   Yes Historical Provider, MD  sitaGLIPtin-metformin (JANUMET) 50-1000 MG per tablet Take 1 tablet by mouth 2 (two) times daily with a meal. 01/30/13  Yes Hazel Sams, PA-C  Vitamin D, Ergocalciferol, (DRISDOL) 50000 UNITS CAPS capsule Take 50,000 Units by mouth every 7 (seven) days. Monday   Yes Historical Provider, MD  clindamycin (CLEOCIN) 150 MG capsule Take 2 capsules (300 mg total) by mouth 3 (three) times daily. Patient not taking: Reported on 10/07/2014 10/04/13   Tatyana Kirichenko, PA-C  cyclobenzaprine (FLEXERIL) 10 MG tablet Take 1 tablet (10 mg total) by mouth 2 (two) times daily as needed for muscle spasms. 05/09/13   Wandra Arthurs, MD  HYDROcodone-acetaminophen (NORCO/VICODIN) 5-325 MG per tablet Take 2 tablets by mouth every 4 (four) hours as needed for moderate pain or severe pain. Patient not taking: Reported on 10/07/2014 10/04/13   Tatyana Kirichenko, PA-C  ibuprofen (ADVIL,MOTRIN) 200 MG tablet Take 400 mg by mouth every 6 (six) hours as needed.    Historical Provider, MD  insulin aspart protamine- aspart (NOVOLOG MIX 70/30)  (70-30) 100 UNIT/ML injection Inject 0.6 mLs (60 Units total) into the skin 2 (two) times daily with a meal. Patient not taking: Reported on 10/07/2014 01/30/13   Hazel Sams, PA-C  lisinopril (PRINIVIL,ZESTRIL) 20 MG tablet Take 0.5 tablets (10 mg total) by mouth daily. Patient not taking: Reported on 10/07/2014 01/30/13   Hazel Sams, PA-C   BP 146/73 mmHg  Pulse 98  Temp(Src) 98.7 F (37.1 C) (Oral)  Resp 18  SpO2 100% Physical Exam  Constitutional: She is oriented to person, place, and time. Vital signs are normal. She appears well-developed and well-nourished.  Non-toxic appearance. No distress.  Afebrile, nontoxic, NAD. Morbidly obese.  HENT:  Head: Normocephalic and atraumatic.  Mouth/Throat: Oropharynx is clear and moist and mucous membranes are normal.  Eyes: Conjunctivae and EOM are normal. Pupils are equal, round, and reactive to light. Right eye exhibits no discharge. Left eye exhibits no discharge.  Neck: Normal range of motion. Neck supple. Muscular tenderness present. No spinous process tenderness present. No rigidity. Normal range of motion present.    FROM intact without spinous process TTP, no bony stepoffs or deformities, with mild L sided paraspinous muscle TTP and palpable muscle spasms extending into L trapezius. No rigidity or meningeal signs. No bruising or swelling.   Cardiovascular: Normal rate, regular rhythm, normal heart  sounds and intact distal pulses.  Exam reveals no gallop and no friction rub.   No murmur heard. RRR, nl s1/s2, no m/r/g, distal pulses intact, no pedal edema   Pulmonary/Chest: Effort normal and breath sounds normal. No respiratory distress. She has no decreased breath sounds. She has no wheezes. She has no rhonchi. She has no rales. She exhibits tenderness. She exhibits no crepitus, no deformity and no retraction.    CTAB in all lung fields, no w/r/r, no hypoxia or increased WOB, speaking in full sentences, SpO2 98% on RA Chest wall mildly  TTP anteriorly over the L side, no crepitus or deformity, no retractions.   Abdominal: Soft. Normal appearance and bowel sounds are normal. She exhibits no distension. There is no tenderness. There is no rigidity, no rebound, no guarding, no CVA tenderness, no tenderness at McBurney's point and negative Murphy's sign.  Musculoskeletal: Normal range of motion.  Cervical spine exam as above L trapezius tenderness and spasm as noted above All other spinal levels nonTTP without step offs. MAE x4 Strength and sensation grossly intact Distal pulses intact No pedal edema  Neurological: She is alert and oriented to person, place, and time. She has normal strength. No sensory deficit.  No focal neuro deficits  Skin: Skin is warm, dry and intact. No rash noted.  Psychiatric: She has a normal mood and affect.  Nursing note and vitals reviewed.   ED Course  Procedures (including critical care time) Labs Review Labs Reviewed  CBC WITH DIFFERENTIAL/PLATELET - Abnormal; Notable for the following:    Hemoglobin 11.0 (*)    HCT 33.8 (*)    All other components within normal limits  BASIC METABOLIC PANEL - Abnormal; Notable for the following:    Glucose, Bld 171 (*)    Calcium 8.5 (*)    All other components within normal limits  I-STAT TROPOININ, ED    Imaging Review Dg Chest 2 View  10/07/2014   CLINICAL DATA:  Chest pain since yesterday.  EXAM: CHEST  2 VIEW  COMPARISON:  05/09/2013  FINDINGS: The heart size and mediastinal contours are within normal limits. Both lungs are clear. The visualized skeletal structures are unremarkable.  IMPRESSION: Normal chest x-ray.   Electronically Signed   By: Marijo Sanes M.D.   On: 10/07/2014 11:03     EKG Interpretation   Date/Time:  Monday October 07 2014 11:08:05 EDT Ventricular Rate:  84 PR Interval:  154 QRS Duration: 83 QT Interval:  371 QTC Calculation: 438 R Axis:   39 Text Interpretation:  Sinus rhythm Baseline wander in lead(s) V3 New since    previous tracing (tracing from 07/28/04) Confirmed by Winfred [place]  MD, SAM  [phone]) on 10/07/2014 11:12:06 AM      MDM   Final diagnoses:  Chest pain  Trapezius muscle spasm  Neck pain on left side  Atypical chest pain  Anemia, unspecified anemia type  Hyperglycemia due to type 2 diabetes mellitus    49 y.o. female here for L trapezius/neck pain radiating into her chest, constant x1 day, no known injury. Also states that when she ate bojangles earlier her lips were tingling. All extremities neurovascularly intact, nonfocal neuro exam. Had SOB earlier today but none now. Pain reproducible in trapzius and L paracervical muscles as well as in L chest wall anteriorly. Nonsmoker, no family hx of heart disease, but pt with some RFs due to HTN, DM, and HLD. Will get labs, EKG, CXR, and give ASA, morphine, GI cocktail, and NTG  staggered apart in order to assess which is helping her pain. Likely musculoskeletal pain. Will reassess shortly.   11:49 AM Pain resolved in her chest after ASA, GI cocktail, and morphine. Still having some discomfort in neck/trapezius, will give flexeril. Trop neg, EKG unremarkable, CXR clear, BMP showing mildly elevated glucose without anion gap, CBC unremarkable aside from stable anemia. I suspect this pain is musculoskeletal, will reassess after flexeril. Given that it's been ongoing x >24hrs, doubt need for second troponin.   12:45 PM Pain overall improving. Will d/c home with naprosyn, norco, and flexeril. Discussed use of heat. Will have her f/up with PCP in 1wk for recheck. I explained the diagnosis and have given explicit precautions to return to the ER including for any other new or worsening symptoms. The patient understands and accepts the medical plan as it's been dictated and I have answered their questions. Discharge instructions concerning home care and prescriptions have been given. The patient is STABLE and is discharged to home in good condition.  BP 119/74  mmHg  Pulse 85  Temp(Src) 98.7 F (37.1 C) (Oral)  Resp 17  SpO2 98%  LMP 09/26/2014  Meds ordered this encounter  Medications  . aspirin chewable tablet 324 mg    Sig:   . nitroGLYCERIN (NITROSTAT) SL tablet 0.4 mg    Sig:   . gi cocktail (Maalox,Lidocaine,Donnatal)    Sig:   . morphine 4 MG/ML injection 4 mg    Sig:   . cyclobenzaprine (FLEXERIL) tablet 10 mg    Sig:   . cyclobenzaprine (FLEXERIL) 10 MG tablet    Sig: Take 1 tablet (10 mg total) by mouth 3 (three) times daily as needed for muscle spasms.    Dispense:  15 tablet    Refill:  0    Order Specific Question:  Supervising Provider    Answer:  MILLER, BRIAN [[id number]]  . HYDROcodone-acetaminophen (NORCO) 5-325 MG per tablet    Sig: Take 1 tablet by mouth every 6 (six) hours as needed for severe pain.    Dispense:  6 tablet    Refill:  0    Order Specific Question:  Supervising Provider    Answer:  MILLER, BRIAN [[id number]]  . naproxen (NAPROSYN) 500 MG tablet    Sig: Take 1 tablet (500 mg total) by mouth 2 (two) times daily as needed for mild pain, moderate pain or headache (TAKE WITH MEALS.).    Dispense:  20 tablet    Refill:  0    Order Specific Question:  Supervising Provider    Answer:  Noemi Chapel [[id number]]     Pailyn Bellevue Camprubi-Soms, PA-C 10/07/14 1245  Orlie Dakin, MD 10/07/14 [phone]

## 2014-10-07 NOTE — ED Provider Notes (Signed)
MSE was initiated and I personally evaluated the patient and placed orders (if any) at  10:13 AM on October 07, 2014.  The patient appears stable so that the remainder of the MSE may be completed by another provider.  Patient with chief complaint of back pain, neck pain and shoulder pain.  Also reports having associated chest pain, SOB, and some nausea.    Will move to acute for chest pain workup.  Symptoms likely atypical, but given risk factors, more workup is indicated.  Montine Circle, PA-C 10/07/14 1015  Orlie Dakin, MD 10/07/14 [phone]

## 2014-10-07 NOTE — ED Notes (Signed)
Changed acuity per PA.  Patient now complaining of chest pain and intermittent SOB.

## 2014-10-07 NOTE — ED Notes (Signed)
Pt eating [employer] in exam room.

## 2014-10-07 NOTE — ED Notes (Signed)
Pt presents to department for evaluation of L neck, L shoulder, and back pain. Onset x1 day ago. Denies recent injury. Pt is alert and oriented x4.

## 2014-10-10 ENCOUNTER — Ambulatory Visit: Payer: BC Managed Care – PPO | Admitting: Podiatry

## 2014-10-19 ENCOUNTER — Emergency Department (HOSPITAL_COMMUNITY): Payer: BC Managed Care – PPO

## 2014-10-19 ENCOUNTER — Encounter (HOSPITAL_COMMUNITY): Payer: Self-pay | Admitting: Emergency Medicine

## 2014-10-19 ENCOUNTER — Emergency Department (HOSPITAL_COMMUNITY)
Admission: EM | Admit: 2014-10-19 | Discharge: 2014-10-19 | Disposition: A | Discharge status: Home / Self Care | Payer: BC Managed Care – PPO | Attending: Emergency Medicine | Admitting: Emergency Medicine

## 2014-10-19 DIAGNOSIS — M549 Dorsalgia, unspecified: Secondary | ICD-10-CM | POA: Diagnosis not present

## 2014-10-19 DIAGNOSIS — E780 Pure hypercholesterolemia: Secondary | ICD-10-CM | POA: Insufficient documentation

## 2014-10-19 DIAGNOSIS — Z79899 Other long term (current) drug therapy: Secondary | ICD-10-CM | POA: Insufficient documentation

## 2014-10-19 DIAGNOSIS — I10 Essential (primary) hypertension: Secondary | ICD-10-CM | POA: Insufficient documentation

## 2014-10-19 DIAGNOSIS — E119 Type 2 diabetes mellitus without complications: Secondary | ICD-10-CM | POA: Insufficient documentation

## 2014-10-19 DIAGNOSIS — Z794 Long term (current) use of insulin: Secondary | ICD-10-CM | POA: Insufficient documentation

## 2014-10-19 DIAGNOSIS — M79603 Pain in arm, unspecified: Secondary | ICD-10-CM | POA: Insufficient documentation

## 2014-10-19 DIAGNOSIS — M542 Cervicalgia: Secondary | ICD-10-CM | POA: Diagnosis present

## 2014-10-19 DIAGNOSIS — Z7982 Long term (current) use of aspirin: Secondary | ICD-10-CM | POA: Diagnosis not present

## 2014-10-19 LAB — I-STAT CHEM 8, ED
BUN: 5 mg/dL — AB (ref 6–20)
CHLORIDE: 99 mmol/L — AB (ref 101–111)
Calcium, Ion: 1.12 mmol/L (ref 1.12–1.23)
Creatinine, Ser: 0.5 mg/dL (ref 0.44–1.00)
GLUCOSE: 262 mg/dL — AB (ref 65–99)
HEMATOCRIT: 38 % (ref 36.0–46.0)
HEMOGLOBIN: 12.9 g/dL (ref 12.0–15.0)
Potassium: 3.8 mmol/L (ref 3.5–5.1)
Sodium: 137 mmol/L (ref 135–145)
TCO2: 24 mmol/L (ref 0–100)

## 2014-10-19 MED ORDER — DIAZEPAM 5 MG PO TABS: 5.0000 mg | ORAL_TABLET | ORAL | Status: DC | PRN

## 2014-10-19 MED ORDER — DIAZEPAM 5 MG PO TABS
5.0000 mg | ORAL_TABLET | Freq: Once | ORAL | Status: AC
  Administered 2014-10-19: 5 mg via ORAL
  Filled 2014-10-19: qty 1

## 2014-10-19 MED ORDER — KETOROLAC TROMETHAMINE 60 MG/2ML IM SOLN
60.0000 mg | Freq: Once | INTRAMUSCULAR | Status: AC
  Administered 2014-10-19: 60 mg via INTRAMUSCULAR
  Filled 2014-10-19: qty 2

## 2014-10-19 MED ORDER — OXYCODONE-ACETAMINOPHEN 5-325 MG PO TABS: 1.0000 | ORAL_TABLET | ORAL | Status: DC | PRN

## 2014-10-19 MED ORDER — MORPHINE SULFATE 4 MG/ML IJ SOLN
8.0000 mg | Freq: Once | INTRAMUSCULAR | Status: AC
  Administered 2014-10-19: 8 mg via INTRAMUSCULAR
  Filled 2014-10-19: qty 2

## 2014-10-19 MED ORDER — HYDROMORPHONE HCL 1 MG/ML IJ SOLN
1.0000 mg | Freq: Once | INTRAMUSCULAR | Status: AC
  Administered 2014-10-19: 1 mg via INTRAMUSCULAR
  Filled 2014-10-19: qty 1

## 2014-10-19 NOTE — ED Provider Notes (Signed)
CSN: [id number]     Arrival date & time 10/19/14  1126 History   First MD Initiated Contact with Patient 10/19/14 1128     Chief Complaint  Patient presents with  . Shoulder Pain  . Arm Pain  . Back Pain  . Hand Pain     (Consider location/radiation/quality/duration/timing/severity/associated sxs/prior Treatment) HPI Comments: Patient here with persistent left-sided neck and back and shoulder pain 2 weeks. Seen for similar symptoms 12 days ago on her chart was reviewed. Symptoms have been persistent and characterized as sharp. No associated dyspnea or diaphoresis. No chest pain or chest pressure. Denies any bowel or bladder dysfunction. No weakness to her left hand. Has been using NSAIDs without relief. No other neurological symptoms noted  Patient is a 49 y.o. female presenting with shoulder pain, arm pain, back pain, and hand pain. The history is provided by the patient.  Shoulder Pain Associated symptoms: back pain   Arm Pain  Back Pain Hand Pain    Past Medical History  Diagnosis Date  . Diabetes mellitus   . Hypertension   . Hypercholesteremia    Past Surgical History  Procedure Laterality Date  . Cesarean section    . Tubal ligation     No family history on file. History  Substance Use Topics  . Smoking status: Never Smoker   . Smokeless tobacco: Never Used  . Alcohol Use: No   OB History    No data available     Review of Systems  Musculoskeletal: Positive for back pain.  All other systems reviewed and are negative.     Allergies  Review of patient's allergies indicates no known allergies.  Home Medications   Prior to Admission medications   Medication Sig Start Date End Date Taking? Authorizing Provider  aspirin 81 MG tablet Take 81 mg by mouth daily.    Historical Provider, MD  cetirizine (ZYRTEC) 10 MG tablet Take 10 mg by mouth daily.    Historical Provider, MD  clindamycin (CLEOCIN) 150 MG capsule Take 2 capsules (300 mg total) by mouth 3  (three) times daily. Patient not taking: Reported on 10/07/2014 10/04/13   Tatyana Kirichenko, PA-C  cyclobenzaprine (FLEXERIL) 10 MG tablet Take 1 tablet (10 mg total) by mouth 2 (two) times daily as needed for muscle spasms. 05/09/13   Wandra Arthurs, MD  cyclobenzaprine (FLEXERIL) 10 MG tablet Take 1 tablet (10 mg total) by mouth 3 (three) times daily as needed for muscle spasms. 10/07/14   Mercedes Camprubi-Soms, PA-C  gabapentin (NEURONTIN) 300 MG capsule Take 300 mg by mouth 3 (three) times daily.    Historical Provider, MD  HYDROcodone-acetaminophen (NORCO) 5-325 MG per tablet Take 1 tablet by mouth every 6 (six) hours as needed for severe pain. 10/07/14   Mercedes Camprubi-Soms, PA-C  HYDROcodone-acetaminophen (NORCO/VICODIN) 5-325 MG per tablet Take 2 tablets by mouth every 4 (four) hours as needed for moderate pain or severe pain. Patient not taking: Reported on 10/07/2014 10/04/13   Tatyana Kirichenko, PA-C  ibuprofen (ADVIL,MOTRIN) 200 MG tablet Take 400 mg by mouth every 6 (six) hours as needed.    Historical Provider, MD  insulin aspart protamine- aspart (NOVOLOG MIX 70/30) (70-30) 100 UNIT/ML injection Inject 0.6 mLs (60 Units total) into the skin 2 (two) times daily with a meal. Patient not taking: Reported on 10/07/2014 01/30/13   Hazel Sams, PA-C  Insulin Degludec (TRESIBA FLEXTOUCH) 200 UNIT/ML SOPN Inject 100 Units into the skin every morning.    Historical Provider, MD  Liraglutide (VICTOZA [place]) Inject 1.8 mg into the skin every morning.    Historical Provider, MD  lisinopril (PRINIVIL,ZESTRIL) 20 MG tablet Take 0.5 tablets (10 mg total) by mouth daily. Patient not taking: Reported on 10/07/2014 01/30/13   Hazel Sams, PA-C  losartan-hydrochlorothiazide (HYZAAR) 100-12.5 MG per tablet Take 1 tablet by mouth daily.    Historical Provider, MD  naproxen (NAPROSYN) 500 MG tablet Take 1 tablet (500 mg total) by mouth 2 (two) times daily as needed for mild pain, moderate pain or headache (TAKE WITH  MEALS.). 10/07/14   Mercedes Camprubi-Soms, PA-C  simvastatin (ZOCOR) 20 MG tablet Take 20 mg by mouth daily at 6 PM.    Historical Provider, MD  sitaGLIPtin-metformin (JANUMET) 50-1000 MG per tablet Take 1 tablet by mouth 2 (two) times daily with a meal. 01/30/13   Hazel Sams, PA-C  Vitamin D, Ergocalciferol, (DRISDOL) 50000 UNITS CAPS capsule Take 50,000 Units by mouth every 7 (seven) days. Monday    Historical Provider, MD   BP 157/82 mmHg  Pulse 105  Resp 12  SpO2 99%  LMP 09/26/2014 Physical Exam  Constitutional: She is oriented to person, place, and time. She appears well-developed and well-nourished.  Non-toxic appearance. No distress.  HENT:  Head: Normocephalic and atraumatic.  Eyes: Conjunctivae, EOM and lids are normal. Pupils are equal, round, and reactive to light.  Neck: Normal range of motion. Neck supple. No tracheal deviation present. No thyroid mass present.  Cardiovascular: Normal rate, regular rhythm and normal heart sounds.  Exam reveals no gallop.   No murmur heard. Pulmonary/Chest: Effort normal and breath sounds normal. No stridor. No respiratory distress. She has no decreased breath sounds. She has no wheezes. She has no rhonchi. She has no rales.  Abdominal: Soft. Normal appearance and bowel sounds are normal. She exhibits no distension. There is no tenderness. There is no rebound and no CVA tenderness.  Musculoskeletal: Normal range of motion. She exhibits no edema or tenderness.       Arms: Neurological: She is alert and oriented to person, place, and time. She has normal strength. No cranial nerve deficit or sensory deficit. GCS eye subscore is 4. GCS verbal subscore is 5. GCS motor subscore is 6.  Skin: Skin is warm and dry. No abrasion and no rash noted.  Psychiatric: She has a normal mood and affect. Her speech is normal and behavior is normal.  Nursing note and vitals reviewed.   ED Course  Procedures (including critical care time) Labs Review Labs  Reviewed  I-STAT CHEM 8, ED    Imaging Review No results found.   EKG Interpretation   Date/Time:  Saturday October 19 2014 11:42:13 EDT Ventricular Rate:  113 PR Interval:  153 QRS Duration: 73 QT Interval:  316 QTC Calculation: 433 R Axis:   30 Text Interpretation:  Sinus tachycardia Abnormal R-wave progression, early  transition Borderline T wave abnormalities Baseline wander in lead(s) V6  heart rate increased from prior Confirmed by Sundai Probert  MD, Leani Myron ([id number]) on  10/19/2014 11:47:52 AM      MDM   Final diagnoses:  Neck pain   Patient given 2 rounds of pain medications here feels better. Suspect musculoskeletal etiology of his symptoms and will place on medications and she is stable for discharge     Lacretia Leigh, MD 10/19/14 1537

## 2014-10-19 NOTE — ED Notes (Signed)
Pt arrived to ED with c/o left back/shoulder blade pain that started 2 weeks ago that radiates to fingers. Pt was seen 2 weeks ago for same symptoms and discharged with pain medication and told to see a PCP. Pt unable to get an appointment until Aug 12 and stated that pain medications that she was prescribed does not help.

## 2014-10-19 NOTE — Discharge Instructions (Signed)

## 2014-10-19 NOTE — ED Notes (Signed)
Patient returned from CT

## 2014-10-25 ENCOUNTER — Ambulatory Visit: Payer: Self-pay

## 2014-10-25 ENCOUNTER — Other Ambulatory Visit: Payer: Self-pay | Admitting: Occupational Medicine

## 2014-10-25 DIAGNOSIS — M549 Dorsalgia, unspecified: Secondary | ICD-10-CM

## 2015-04-06 DIAGNOSIS — J189 Pneumonia, unspecified organism: Secondary | ICD-10-CM

## 2015-04-06 HISTORY — DX: Pneumonia, unspecified organism: J18.9

## 2015-05-23 ENCOUNTER — Emergency Department (HOSPITAL_COMMUNITY)
Admission: EM | Admit: 2015-05-23 | Discharge: 2015-05-24 | Disposition: A | Discharge status: Home / Self Care | Payer: BC Managed Care – PPO | Attending: Emergency Medicine | Admitting: Emergency Medicine

## 2015-05-23 ENCOUNTER — Encounter (HOSPITAL_COMMUNITY): Payer: Self-pay | Admitting: *Deleted

## 2015-05-23 DIAGNOSIS — R51 Headache: Secondary | ICD-10-CM | POA: Insufficient documentation

## 2015-05-23 DIAGNOSIS — Z7984 Long term (current) use of oral hypoglycemic drugs: Secondary | ICD-10-CM | POA: Insufficient documentation

## 2015-05-23 DIAGNOSIS — E7800 Pure hypercholesterolemia, unspecified: Secondary | ICD-10-CM | POA: Insufficient documentation

## 2015-05-23 DIAGNOSIS — I10 Essential (primary) hypertension: Secondary | ICD-10-CM | POA: Insufficient documentation

## 2015-05-23 DIAGNOSIS — Z79899 Other long term (current) drug therapy: Secondary | ICD-10-CM | POA: Diagnosis not present

## 2015-05-23 DIAGNOSIS — Z7982 Long term (current) use of aspirin: Secondary | ICD-10-CM | POA: Insufficient documentation

## 2015-05-23 DIAGNOSIS — Z794 Long term (current) use of insulin: Secondary | ICD-10-CM | POA: Diagnosis not present

## 2015-05-23 DIAGNOSIS — R519 Headache, unspecified: Secondary | ICD-10-CM

## 2015-05-23 DIAGNOSIS — E119 Type 2 diabetes mellitus without complications: Secondary | ICD-10-CM | POA: Diagnosis not present

## 2015-05-23 DIAGNOSIS — Z3202 Encounter for pregnancy test, result negative: Secondary | ICD-10-CM | POA: Insufficient documentation

## 2015-05-23 DIAGNOSIS — R000 Tachycardia, unspecified: Secondary | ICD-10-CM | POA: Insufficient documentation

## 2015-05-23 DIAGNOSIS — R42 Dizziness and giddiness: Secondary | ICD-10-CM | POA: Insufficient documentation

## 2015-05-23 LAB — CBC WITH DIFFERENTIAL/PLATELET
Basophils Absolute: 0 10*3/uL (ref 0.0–0.1)
Basophils Relative: 0 %
EOS ABS: 0.1 10*3/uL (ref 0.0–0.7)
Eosinophils Relative: 1 %
HEMATOCRIT: 34.1 % — AB (ref 36.0–46.0)
HEMOGLOBIN: 10.6 g/dL — AB (ref 12.0–15.0)
LYMPHS ABS: 3.3 10*3/uL (ref 0.7–4.0)
Lymphocytes Relative: 38 %
MCH: 25.9 pg — ABNORMAL LOW (ref 26.0–34.0)
MCHC: 31.1 g/dL (ref 30.0–36.0)
MCV: 83.4 fL (ref 78.0–100.0)
MONOS PCT: 5 %
Monocytes Absolute: 0.4 10*3/uL (ref 0.1–1.0)
NEUTROS PCT: 56 %
Neutro Abs: 4.8 10*3/uL (ref 1.7–7.7)
Platelets: 343 10*3/uL (ref 150–400)
RBC: 4.09 MIL/uL (ref 3.87–5.11)
RDW: 12.5 % (ref 11.5–15.5)
WBC: 8.5 10*3/uL (ref 4.0–10.5)

## 2015-05-23 LAB — COMPREHENSIVE METABOLIC PANEL
ALK PHOS: 78 U/L (ref 38–126)
ALT: 15 U/L (ref 14–54)
ANION GAP: 10 (ref 5–15)
AST: 18 U/L (ref 15–41)
Albumin: 3.1 g/dL — ABNORMAL LOW (ref 3.5–5.0)
BILIRUBIN TOTAL: 0.1 mg/dL — AB (ref 0.3–1.2)
BUN: 7 mg/dL (ref 6–20)
CALCIUM: 8.8 mg/dL — AB (ref 8.9–10.3)
CO2: 25 mmol/L (ref 22–32)
Chloride: 105 mmol/L (ref 101–111)
Creatinine, Ser: 0.67 mg/dL (ref 0.44–1.00)
GFR calc Af Amer: >60 mL/min (ref >60)
GFR calc non Af Amer: >60 mL/min (ref >60)
Glucose, Bld: 276 mg/dL — ABNORMAL HIGH (ref 65–99)
Potassium: 3.6 mmol/L (ref 3.5–5.1)
SODIUM: 140 mmol/L (ref 135–145)
TOTAL PROTEIN: 7 g/dL (ref 6.5–8.1)

## 2015-05-23 LAB — I-STAT BETA HCG BLOOD, ED (MC, WL, AP ONLY): I-stat hCG, quantitative: <5 m[IU]/mL (ref <5)

## 2015-05-23 LAB — POC URINE PREG, ED: PREG TEST UR: NEGATIVE

## 2015-05-23 MED ORDER — SODIUM CHLORIDE 0.9 % IV BOLUS (SEPSIS)
1000.0000 mL | Freq: Once | INTRAVENOUS | Status: AC
  Administered 2015-05-23: 1000 mL via INTRAVENOUS

## 2015-05-23 MED ORDER — METOCLOPRAMIDE HCL 5 MG/ML IJ SOLN
10.0000 mg | Freq: Once | INTRAMUSCULAR | Status: AC
  Administered 2015-05-23: 10 mg via INTRAVENOUS
  Filled 2015-05-23: qty 2

## 2015-05-23 MED ORDER — DIPHENHYDRAMINE HCL 50 MG/ML IJ SOLN
25.0000 mg | Freq: Once | INTRAMUSCULAR | Status: AC
  Administered 2015-05-23: 25 mg via INTRAVENOUS
  Filled 2015-05-23: qty 1

## 2015-05-23 MED ORDER — KETOROLAC TROMETHAMINE 30 MG/ML IJ SOLN
30.0000 mg | Freq: Once | INTRAMUSCULAR | Status: AC
  Administered 2015-05-24: 30 mg via INTRAVENOUS
  Filled 2015-05-23: qty 1

## 2015-05-23 NOTE — ED Provider Notes (Signed)
CSN: [id number]     Arrival date & time 05/23/15  1944 History  By signing my name below, I, Irene Pap, attest that this documentation has been prepared under the direction and in the presence of Merryl Hacker, MD. Electronically Signed: Irene Pap, ED Scribe. 05/23/2015. 11:43 PM.   Chief Complaint  Patient presents with  . Headache  . Dizziness   The history is provided by the patient. No language interpreter was used.   HPI Comments: Marilyn [name] is a 50 y.o. Female with a hx of DM, HTN, and headache who presents to the Emergency Department complaining of gradually worsening occipital headache that radiates to the ocular region onset 2 days ago. Pt states that the headache worsened while in the waiting room. She reports associated lightheadedness and room spinning dizziness. She was given Benadryl and reglan prior to my evaluation. She did not take anything for her pain at home. She currently rates her pain 8/10. She denies fever, chills, photophobia, nausea, or vomiting.   Past Medical History  Diagnosis Date  . Diabetes mellitus   . Hypertension   . Hypercholesteremia    Past Surgical History  Procedure Laterality Date  . Cesarean section    . Tubal ligation     No family history on file. Social History  Substance Use Topics  . Smoking status: Never Smoker   . Smokeless tobacco: Never Used  . Alcohol Use: No   OB History    No data available     Review of Systems  Constitutional: Negative for fever and chills.  Eyes: Negative for photophobia and visual disturbance.  Gastrointestinal: Negative for nausea and vomiting.  Neurological: Positive for dizziness, light-headedness and headaches.  All other systems reviewed and are negative.   Allergies  Review of patient's allergies indicates no known allergies.  Home Medications   Prior to Admission medications   Medication Sig Start Date End Date Taking? Authorizing Provider  gabapentin (NEURONTIN) 300  MG capsule Take 300-600 mg by mouth 3 (three) times daily. Take 1 capsule (300 mg) by mouth daily every morning and at 3pm, take 2 capsules (600 mg) at bedtime   Yes Historical Provider, MD  Insulin Degludec (TRESIBA FLEXTOUCH) 200 UNIT/ML SOPN Inject 100 Units into the skin daily before breakfast.    Yes Historical Provider, MD  Liraglutide (VICTOZA) 18 MG/3ML SOPN Inject 1.8 mg into the skin daily.   Yes Historical Provider, MD  aspirin 81 MG tablet Take 81 mg by mouth daily.    Historical Provider, MD  cetirizine (ZYRTEC) 10 MG tablet Take 10 mg by mouth daily.    Historical Provider, MD  clindamycin (CLEOCIN) 150 MG capsule Take 2 capsules (300 mg total) by mouth 3 (three) times daily. Patient not taking: Reported on 10/07/2014 10/04/13   Tatyana Kirichenko, PA-C  cyclobenzaprine (FLEXERIL) 10 MG tablet Take 1 tablet (10 mg total) by mouth 2 (two) times daily as needed for muscle spasms. Patient not taking: Reported on 10/19/2014 05/09/13   Wandra Arthurs, MD  cyclobenzaprine (FLEXERIL) 10 MG tablet Take 1 tablet (10 mg total) by mouth 3 (three) times daily as needed for muscle spasms. 10/07/14   Mercedes Camprubi-Soms, PA-C  diazepam (VALIUM) 5 MG tablet Take 1 tablet (5 mg total) by mouth every 4 (four) hours as needed for muscle spasms. 10/19/14   Lacretia Leigh, MD  HYDROcodone-acetaminophen (NORCO) 5-325 MG per tablet Take 1 tablet by mouth every 6 (six) hours as needed for severe pain. 10/07/14  Mercedes Camprubi-Soms, PA-C  HYDROcodone-acetaminophen (NORCO/VICODIN) 5-325 MG per tablet Take 2 tablets by mouth every 4 (four) hours as needed for moderate pain or severe pain. Patient not taking: Reported on 10/07/2014 10/04/13   Tatyana Kirichenko, PA-C  ibuprofen (ADVIL,MOTRIN) 200 MG tablet Take 800 mg by mouth every 6 (six) hours as needed for moderate pain.     Historical Provider, MD  insulin aspart protamine- aspart (NOVOLOG MIX 70/30) (70-30) 100 UNIT/ML injection Inject 0.6 mLs (60 Units total) into the  skin 2 (two) times daily with a meal. Patient not taking: Reported on 10/07/2014 01/30/13   Hazel Sams, PA-C  lisinopril (PRINIVIL,ZESTRIL) 20 MG tablet Take 0.5 tablets (10 mg total) by mouth daily. Patient not taking: Reported on 10/07/2014 01/30/13   Hazel Sams, PA-C  losartan-hydrochlorothiazide (HYZAAR) 100-12.5 MG per tablet Take 1 tablet by mouth daily.    Historical Provider, MD  naproxen (NAPROSYN) 500 MG tablet Take 1 tablet (500 mg total) by mouth 2 (two) times daily as needed for mild pain, moderate pain or headache (TAKE WITH MEALS.). 10/07/14   Mercedes Camprubi-Soms, PA-C  oxyCODONE-acetaminophen (PERCOCET/ROXICET) 5-325 MG per tablet Take 1-2 tablets by mouth every 4 (four) hours as needed for moderate pain or severe pain. 10/19/14   Lacretia Leigh, MD  simvastatin (ZOCOR) 20 MG tablet Take 20 mg by mouth daily at 6 PM.    Historical Provider, MD  sitaGLIPtin-metformin (JANUMET) 50-1000 MG per tablet Take 1 tablet by mouth 2 (two) times daily with a meal. Patient taking differently: Take 1 tablet by mouth daily.  01/30/13   Hazel Sams, PA-C  Vitamin D, Ergocalciferol, (DRISDOL) 50000 UNITS CAPS capsule Take 50,000 Units by mouth every 7 (seven) days. Monday    Historical Provider, MD   BP 103/53 mmHg  Pulse 97  Temp(Src) 98.4 F (36.9 C) (Oral)  Resp 21  Ht '5\' 9"'$  (1.753 m)  Wt 289 lb (131.09 kg)  BMI 42.66 kg/m2  SpO2 99%  LMP 02/06/2015 Physical Exam  Constitutional: She is oriented to person, place, and time. No distress.  Morbidly obese  HENT:  Head: Normocephalic and atraumatic.  Eyes: EOM are normal. Pupils are equal, round, and reactive to light.  Cardiovascular: Normal rate, regular rhythm and normal heart sounds.   No murmur heard. Pulmonary/Chest: Effort normal and breath sounds normal. No respiratory distress. She has no wheezes.  Abdominal: Soft. Bowel sounds are normal. There is no tenderness. There is no rebound.  Neurological: She is alert and oriented to  person, place, and time.  Cranial nerves II through XII intact, 5 out of 5 strength in all 4 extremities, no dysmetria to finger-nose-finger  Skin: Skin is warm and dry.  Psychiatric: She has a normal mood and affect.  Nursing note and vitals reviewed.   ED Course  Procedures (including critical care time) DIAGNOSTIC STUDIES: Oxygen Saturation is 98% on RA, normal by my interpretation.    COORDINATION OF CARE: 11:41 PM-Discussed treatment plan which includes labs and EKG with pt at bedside and pt agreed to plan.   Labs Review Labs Reviewed  CBC WITH DIFFERENTIAL/PLATELET - Abnormal; Notable for the following:    Hemoglobin 10.6 (*)    HCT 34.1 (*)    MCH 25.9 (*)    All other components within normal limits  COMPREHENSIVE METABOLIC PANEL - Abnormal; Notable for the following:    Glucose, Bld 276 (*)    Calcium 8.8 (*)    Albumin 3.1 (*)    Total Bilirubin 0.1 (*)  All other components within normal limits  URINALYSIS, ROUTINE W REFLEX MICROSCOPIC (NOT AT [facility]) - Abnormal; Notable for the following:    Specific Gravity, Urine 1.033 (*)    Glucose, UA 500 (*)    All other components within normal limits  POC URINE PREG, ED  I-STAT BETA HCG BLOOD, ED ([facility], [facility], [facility] ONLY)    Imaging Review No results found. I have personally reviewed and evaluated these images and lab results as part of my medical decision-making.   EKG Interpretation   Date/Time:  Friday May 23 2015 20:17:38 EST Ventricular Rate:  129 PR Interval:  154 QRS Duration: 76 QT Interval:  292 QTC Calculation: 427 R Axis:   51 Text Interpretation:  Sinus tachycardia Nonspecific T wave abnormality  Abnormal ECG tachycardia present previously Confirmed by YAO  MD, DAVID  ([id number]) on 05/23/2015 10:02:26 PM      MDM   Final diagnoses:  Nonintractable headache, unspecified chronicity pattern, unspecified headache type    Patient presents with headache and dizziness. Initially noted to be tachycardic  in the 120s. Otherwise vital signs and exam is reassuring. She is nonfocal. Reports a history of headaches.  Has not taken anything at home. Has received a migraine cocktail but continues to report symptoms. Toradol was added. Given timeline and description of headache, doubt subarachnoid hemorrhage. Denies vision complaints and have low suspicion for intracranial hypertension. No evidence of fever or neck stiffness suggestive of meningitis.  2:30 AM Patient reports persistent 7 out of 10 headache. Dizziness is improved. Decadron, magnesium, Compazine, and Benadryl were added to headache cocktail.  4:02 AM Patient reports marked improvement of headache. Vital signs are reassuring. Pulse rate now 97. Will have patient follow-up with her primary physician.  After history, exam, and medical workup I feel the patient has been appropriately medically screened and is safe for discharge home. Pertinent diagnoses were discussed with the patient. Patient was given return precautions.  I personally performed the services described in this documentation, which was scribed in my presence. The recorded information has been reviewed and is accurate.    Merryl Hacker, MD 05/24/15 [phone]

## 2015-05-23 NOTE — ED Notes (Signed)
Patient presents with c/o headache and sometimes feels dizzy from the headache  Denies photophobia

## 2015-05-23 NOTE — ED Notes (Signed)
MD at bedside. 

## 2015-05-23 NOTE — ED Provider Notes (Signed)
MSE was initiated and I personally evaluated the patient and placed orders (if any) at  10:16 PM on May 23, 2015.  The patient appears stable so that the remainder of the MSE may be completed by another provider.  Patient here with headache, dizziness. I was called because she was tachycardic. Triage vitals showed pulse 142, HR on EKG was 120, sinus tach. Denies vertigo. She has headaches for about 3 days. Hx of migraines but not usually as severe. Will get IVF and give migraine cocktail for now.   Wandra Arthurs, MD 05/23/15 2217

## 2015-05-24 LAB — URINALYSIS, ROUTINE W REFLEX MICROSCOPIC
Bilirubin Urine: NEGATIVE
Glucose, UA: 500 mg/dL — AB
HGB URINE DIPSTICK: NEGATIVE
KETONES UR: NEGATIVE mg/dL
LEUKOCYTES UA: NEGATIVE
Nitrite: NEGATIVE
PROTEIN: NEGATIVE mg/dL
Specific Gravity, Urine: 1.033 — ABNORMAL HIGH (ref 1.005–1.030)
pH: 6 (ref 5.0–8.0)

## 2015-05-24 MED ORDER — DIPHENHYDRAMINE HCL 50 MG/ML IJ SOLN
25.0000 mg | Freq: Once | INTRAMUSCULAR | Status: AC
  Administered 2015-05-24: 25 mg via INTRAVENOUS
  Filled 2015-05-24: qty 1

## 2015-05-24 MED ORDER — DEXAMETHASONE SODIUM PHOSPHATE 10 MG/ML IJ SOLN
10.0000 mg | Freq: Once | INTRAMUSCULAR | Status: AC
  Administered 2015-05-24: 10 mg via INTRAVENOUS
  Filled 2015-05-24: qty 1

## 2015-05-24 MED ORDER — MAGNESIUM SULFATE IN D5W 10-5 MG/ML-% IV SOLN
1.0000 g | Freq: Once | INTRAVENOUS | Status: AC
  Administered 2015-05-24: 1 g via INTRAVENOUS
  Filled 2015-05-24: qty 100

## 2015-05-24 MED ORDER — PROCHLORPERAZINE EDISYLATE 5 MG/ML IJ SOLN
10.0000 mg | Freq: Four times a day (QID) | INTRAMUSCULAR | Status: DC | PRN
  Administered 2015-05-24: 10 mg via INTRAVENOUS
  Filled 2015-05-24: qty 2

## 2015-05-24 NOTE — Discharge Instructions (Signed)

## 2015-05-30 ENCOUNTER — Emergency Department (HOSPITAL_COMMUNITY)
Admission: EM | Admit: 2015-05-30 | Discharge: 2015-05-31 | Disposition: A | Discharge status: Home / Self Care | Payer: BC Managed Care – PPO | Attending: Emergency Medicine | Admitting: Emergency Medicine

## 2015-05-30 ENCOUNTER — Emergency Department (HOSPITAL_COMMUNITY): Payer: BC Managed Care – PPO

## 2015-05-30 ENCOUNTER — Encounter (HOSPITAL_COMMUNITY): Payer: Self-pay | Admitting: Emergency Medicine

## 2015-05-30 DIAGNOSIS — R51 Headache: Secondary | ICD-10-CM | POA: Diagnosis not present

## 2015-05-30 DIAGNOSIS — Z7982 Long term (current) use of aspirin: Secondary | ICD-10-CM | POA: Diagnosis not present

## 2015-05-30 DIAGNOSIS — E7800 Pure hypercholesterolemia, unspecified: Secondary | ICD-10-CM | POA: Diagnosis not present

## 2015-05-30 DIAGNOSIS — R000 Tachycardia, unspecified: Secondary | ICD-10-CM | POA: Diagnosis not present

## 2015-05-30 DIAGNOSIS — R079 Chest pain, unspecified: Secondary | ICD-10-CM | POA: Diagnosis present

## 2015-05-30 DIAGNOSIS — I10 Essential (primary) hypertension: Secondary | ICD-10-CM | POA: Insufficient documentation

## 2015-05-30 DIAGNOSIS — E119 Type 2 diabetes mellitus without complications: Secondary | ICD-10-CM | POA: Diagnosis not present

## 2015-05-30 DIAGNOSIS — J45909 Unspecified asthma, uncomplicated: Secondary | ICD-10-CM | POA: Diagnosis not present

## 2015-05-30 DIAGNOSIS — Z794 Long term (current) use of insulin: Secondary | ICD-10-CM | POA: Insufficient documentation

## 2015-05-30 DIAGNOSIS — R519 Headache, unspecified: Secondary | ICD-10-CM

## 2015-05-30 DIAGNOSIS — M542 Cervicalgia: Secondary | ICD-10-CM | POA: Insufficient documentation

## 2015-05-30 DIAGNOSIS — Z79899 Other long term (current) drug therapy: Secondary | ICD-10-CM | POA: Diagnosis not present

## 2015-05-30 DIAGNOSIS — Z7984 Long term (current) use of oral hypoglycemic drugs: Secondary | ICD-10-CM | POA: Insufficient documentation

## 2015-05-30 HISTORY — DX: Unspecified asthma, uncomplicated: J45.909

## 2015-05-30 LAB — BASIC METABOLIC PANEL
ANION GAP: 11 (ref 5–15)
BUN: 8 mg/dL (ref 6–20)
CO2: 27 mmol/L (ref 22–32)
Calcium: 9.4 mg/dL (ref 8.9–10.3)
Chloride: 102 mmol/L (ref 101–111)
Creatinine, Ser: 0.57 mg/dL (ref 0.44–1.00)
GFR calc Af Amer: >60 mL/min (ref >60)
GLUCOSE: 128 mg/dL — AB (ref 65–99)
POTASSIUM: 3.9 mmol/L (ref 3.5–5.1)
Sodium: 140 mmol/L (ref 135–145)

## 2015-05-30 LAB — CBC
HCT: 35.1 % — ABNORMAL LOW (ref 36.0–46.0)
Hemoglobin: 10.9 g/dL — ABNORMAL LOW (ref 12.0–15.0)
MCH: 26 pg (ref 26.0–34.0)
MCHC: 31.1 g/dL (ref 30.0–36.0)
MCV: 83.6 fL (ref 78.0–100.0)
PLATELETS: 367 10*3/uL (ref 150–400)
RBC: 4.2 MIL/uL (ref 3.87–5.11)
RDW: 12.8 % (ref 11.5–15.5)
WBC: 10.5 10*3/uL (ref 4.0–10.5)

## 2015-05-30 LAB — CBG MONITORING, ED: GLUCOSE-CAPILLARY: 107 mg/dL — AB (ref 65–99)

## 2015-05-30 LAB — I-STAT TROPONIN, ED: Troponin i, poc: 0 ng/mL (ref 0.00–0.08)

## 2015-05-30 MED ORDER — IOHEXOL 350 MG/ML SOLN
100.0000 mL | Freq: Once | INTRAVENOUS | Status: AC | PRN
  Administered 2015-05-30: 100 mL via INTRAVENOUS

## 2015-05-30 MED ORDER — FENTANYL CITRATE (PF) 100 MCG/2ML IJ SOLN
50.0000 ug | Freq: Once | INTRAMUSCULAR | Status: AC
  Administered 2015-05-30: 50 ug via INTRAVENOUS
  Filled 2015-05-30: qty 2

## 2015-05-30 MED ORDER — DIPHENHYDRAMINE HCL 50 MG/ML IJ SOLN
25.0000 mg | Freq: Once | INTRAMUSCULAR | Status: AC
  Administered 2015-05-30: 25 mg via INTRAVENOUS
  Filled 2015-05-30: qty 1

## 2015-05-30 MED ORDER — SODIUM CHLORIDE 0.9 % IV BOLUS (SEPSIS)
1000.0000 mL | Freq: Once | INTRAVENOUS | Status: AC
  Administered 2015-05-30: 1000 mL via INTRAVENOUS

## 2015-05-30 MED ORDER — KETOROLAC TROMETHAMINE 15 MG/ML IJ SOLN
15.0000 mg | Freq: Once | INTRAMUSCULAR | Status: AC
  Administered 2015-05-30: 15 mg via INTRAVENOUS
  Filled 2015-05-30: qty 1

## 2015-05-30 MED ORDER — HYDROCODONE-ACETAMINOPHEN 5-325 MG PO TABS: 1.0000 | ORAL_TABLET | Freq: Four times a day (QID) | ORAL | Status: DC | PRN

## 2015-05-30 MED ORDER — METOCLOPRAMIDE HCL 5 MG/ML IJ SOLN
10.0000 mg | Freq: Once | INTRAMUSCULAR | Status: AC
  Administered 2015-05-30: 10 mg via INTRAVENOUS
  Filled 2015-05-30: qty 2

## 2015-05-30 MED ORDER — IOHEXOL 350 MG/ML SOLN
50.0000 mL | Freq: Once | INTRAVENOUS | Status: AC | PRN
  Administered 2015-05-30: 50 mL via INTRAVENOUS

## 2015-05-30 NOTE — ED Notes (Signed)
CBG was 107

## 2015-05-30 NOTE — ED Notes (Signed)
Pt c/o Headache since Wednesday, was seen Friday for headache. Pt now c/o CP starting Wednesday and it travels down the L arm. Pt also c/o heart palpitations. Pt HR 145 on monitor. Pt denies N/V/D. Pt denies SOB. Pt AAOX4, ambulatory, in NAD. Pain 8/10.

## 2015-05-30 NOTE — ED Provider Notes (Signed)
CSN: [id number]     Arrival date & time 05/30/15  1739 History   First MD Initiated Contact with Patient 05/30/15 1810     Chief Complaint  Patient presents with  . Chest Pain  . Headache     Patient is a 50 y.o. female presenting with chest pain and headaches. The history is provided by the patient. No language interpreter was used.  Chest Pain Associated symptoms: headache   Headache  Marilyn [name] is a 50 y.o. female who presents to the Emergency Department complaining of chest pain, headache.  She has a history of hypertension and diabetes. She reports a occipital headache that started about one week ago. Headache was gradual in onset located in the left occipital region. She was seen in the emergency department and her headache improved. She developed recurrent headache 2 days ago. The headache then improved throughout the day only to return today. The headache is throbbing in nature and severe. She has developed associated left-sided chest pain as well. Her chest pain started 2 days ago at rest. Pain is described as sharp in nature and at the left upper chest and radiates down her left arm. She also has pain in her left lateral neck. Pain started suddenly 2 days ago was gone yesterday and then returned today. There are no alleviating or worsening factors. No change with activities, respirations. No fever, cough, abdominal pain, vomiting. No lower extremity edema.  Past Medical History  Diagnosis Date  . Diabetes mellitus   . Hypertension   . Hypercholesteremia   . Asthma    Past Surgical History  Procedure Laterality Date  . Cesarean section    . Tubal ligation     No family history on file. Social History  Substance Use Topics  . Smoking status: Never Smoker   . Smokeless tobacco: Never Used  . Alcohol Use: No   OB History    No data available     Review of Systems  Cardiovascular: Positive for chest pain.  Neurological: Positive for headaches.  All other systems  reviewed and are negative.     Allergies  Review of patient's allergies indicates no known allergies.  Home Medications   Prior to Admission medications   Medication Sig Start Date End Date Taking? Authorizing Provider  aspirin 81 MG tablet Take 81 mg by mouth daily.   Yes Historical Provider, MD  cetirizine (ZYRTEC) 10 MG tablet Take 10 mg by mouth daily.   Yes Historical Provider, MD  cyclobenzaprine (FLEXERIL) 10 MG tablet Take 1 tablet (10 mg total) by mouth 3 (three) times daily as needed for muscle spasms. 10/07/14  Yes Mercedes Camprubi-Soms, PA-C  gabapentin (NEURONTIN) 300 MG capsule Take 300-600 mg by mouth 3 (three) times daily. Take 1 capsule (300 mg) by mouth daily every morning and at 3pm, take 2 capsules (600 mg) at bedtime   Yes Historical Provider, MD  Insulin Degludec (TRESIBA FLEXTOUCH) 200 UNIT/ML SOPN Inject 100 Units into the skin daily before breakfast.    Yes Historical Provider, MD  Liraglutide (VICTOZA) 18 MG/3ML SOPN Inject 1.8 mg into the skin daily.   Yes Historical Provider, MD  losartan-hydrochlorothiazide (HYZAAR) 100-12.5 MG per tablet Take 1 tablet by mouth daily.   Yes Historical Provider, MD  simvastatin (ZOCOR) 20 MG tablet Take 20 mg by mouth daily at 6 PM.   Yes Historical Provider, MD  sitaGLIPtin-metformin (JANUMET) 50-1000 MG per tablet Take 1 tablet by mouth 2 (two) times daily with a  meal. Patient taking differently: Take 1 tablet by mouth daily.  01/30/13  Yes Peter Dammen, PA-C  Vitamin D, Ergocalciferol, (DRISDOL) 50000 UNITS CAPS capsule Take 50,000 Units by mouth every 7 (seven) days. Monday   Yes Historical Provider, MD  clindamycin (CLEOCIN) 150 MG capsule Take 2 capsules (300 mg total) by mouth 3 (three) times daily. Patient not taking: Reported on 10/07/2014 10/04/13   Tatyana Kirichenko, PA-C  cyclobenzaprine (FLEXERIL) 10 MG tablet Take 1 tablet (10 mg total) by mouth 2 (two) times daily as needed for muscle spasms. Patient not taking:  Reported on 10/19/2014 05/09/13   Wandra Arthurs, MD  diazepam (VALIUM) 5 MG tablet Take 1 tablet (5 mg total) by mouth every 4 (four) hours as needed for muscle spasms. 10/19/14   Lacretia Leigh, MD  HYDROcodone-acetaminophen (NORCO) 5-325 MG per tablet Take 1 tablet by mouth every 6 (six) hours as needed for severe pain. 10/07/14   Mercedes Camprubi-Soms, PA-C  HYDROcodone-acetaminophen (NORCO/VICODIN) 5-325 MG per tablet Take 2 tablets by mouth every 4 (four) hours as needed for moderate pain or severe pain. Patient not taking: Reported on 10/07/2014 10/04/13   Lahoma Rocker Kirichenko, PA-C  insulin aspart protamine- aspart (NOVOLOG MIX 70/30) (70-30) 100 UNIT/ML injection Inject 0.6 mLs (60 Units total) into the skin 2 (two) times daily with a meal. Patient not taking: Reported on 10/07/2014 01/30/13   Hazel Sams, PA-C  lisinopril (PRINIVIL,ZESTRIL) 20 MG tablet Take 0.5 tablets (10 mg total) by mouth daily. Patient not taking: Reported on 10/07/2014 01/30/13   Hazel Sams, PA-C  naproxen (NAPROSYN) 500 MG tablet Take 1 tablet (500 mg total) by mouth 2 (two) times daily as needed for mild pain, moderate pain or headache (TAKE WITH MEALS.). 10/07/14   Mercedes Camprubi-Soms, PA-C  oxyCODONE-acetaminophen (PERCOCET/ROXICET) 5-325 MG per tablet Take 1-2 tablets by mouth every 4 (four) hours as needed for moderate pain or severe pain. 10/19/14   Lacretia Leigh, MD   BP 105/70 mmHg  Pulse 135  Temp(Src) 97.6 F (36.4 C)  Resp 24  Ht '5\' 9"'$  (1.753 m)  Wt 289 lb (131.09 kg)  BMI 42.66 kg/m2  SpO2 98%  LMP 02/06/2015 Physical Exam  Constitutional: She is oriented to person, place, and time. She appears well-developed and well-nourished.  HENT:  Head: Normocephalic and atraumatic.  Neck: Neck supple.  Cardiovascular: Regular rhythm.   No murmur heard. Tachycardic. 2+ radial pulses bilaterally. 2+ DP pulses bilaterally.  Pulmonary/Chest: Effort normal and breath sounds normal. No respiratory distress.  Abdominal:  Soft. There is no tenderness. There is no rebound and no guarding.  Musculoskeletal: She exhibits no edema or tenderness.  Neurological: She is alert and oriented to person, place, and time.  Skin: Skin is warm and dry.  Psychiatric: She has a normal mood and affect. Her behavior is normal.  Nursing note and vitals reviewed.   ED Course  Procedures (including critical care time) Labs Review Labs Reviewed  BASIC METABOLIC PANEL - Abnormal; Notable for the following:    Glucose, Bld 128 (*)    All other components within normal limits  CBC - Abnormal; Notable for the following:    Hemoglobin 10.9 (*)    HCT 35.1 (*)    All other components within normal limits  CBG MONITORING, ED - Abnormal; Notable for the following:    Glucose-Capillary 107 (*)    All other components within normal limits  I-STAT TROPOININ, ED    Imaging Review Ct Head Wo Contrast  05/30/2015  CLINICAL DATA:  Headache and chest pain for 2 weeks. Hypertensive. History of hypertension, diabetes, hypercholesterolemia. No trauma. EXAM: CT HEAD WITHOUT CONTRAST CT ANGIOGRAPHY OF THE HEAD TECHNIQUE: Contiguous axial images were obtained from the base of the skull through the vertex without intravenous contrast. Multidetector CT imaging of the head was performed using the standard protocol during bolus administration of intravenous contrast. Multiplanar CT image reconstructions and MIPs were obtained to evaluate the vascular anatomy. CONTRAST:  2m OMNIPAQUE IOHEXOL 350 MG/ML SOLN, 1037mOMNIPAQUE IOHEXOL 350 MG/ML SOLN COMPARISON:  CT head April 25, 2011 and CT cervical spine October 19, 2014 FINDINGS: CT HEAD WITHOUT CONTRAST The ventricles and sulci are normal. No intraparenchymal hemorrhage, mass effect nor midline shift. No acute large vascular territory infarcts. No abnormal extra-axial fluid collections. Basal cisterns are patent. No skull fracture. The included ocular globes and orbital contents are non-suspicious. The  mastoid aircells and included paranasal sinuses are well-aerated. CT ANGIOGRAPHY OF THE HEAD Aortic arch: Normal appearance of the thoracic arch, normal branch pattern. The origins of the innominate, left Common carotid artery and subclavian artery are widely patent. Right carotid system: Common carotid artery is widely patent, coursing in a straight line fashion. Eccentric intimal thickening results in LEFT than 50% stenosis RIGHT internal carotid artery origin. Normal appearance of the included internal carotid artery. Left carotid system: Common carotid artery is widely patent, coursing in a straight line fashion. Eccentric intimal thickening results in 7 D percent stenosis LEFT internal carotid artery origin. Normal appearance of the included internal carotid artery. Vertebral arteries:Bilateral vertebral arteries are patent. Habitus limited examination resulting an streak artifact through the neck, with mild luminal irregularity of the RIGHT greater than LEFT vertebral arteries. Skeleton: No acute osseous process though bone windows have not been submitted. Mild C5-6 disc height loss, ventral endplate spurring compatible with degenerative disc, stable from prior CT. Poor dentition with multiple dental caries, periapical lucency/abscess and absent teeth. Other neck: Soft tissues of the neck are nonacute though, not tailored for evaluation. IMPRESSION: CT HEAD:  No acute intracranial process.  Negative CT head for age. CTA NECK: Approximately 70% stenosis LEFT internal carotid artery origin. Less than 50% stenosis RIGHT internal carotid artery origin. Mild luminal irregularity of the RIGHT greater LEFT vertebral artery's which may represent artifact, atherosclerosis or old injury. No flow limiting stenosis of the vertebral arteries. Electronically Signed   By: CoElon Alas.D.   On: 05/30/2015 21:16   Ct Angio Neck W/cm &/or Wo/cm  05/30/2015  CLINICAL DATA:  Headache and chest pain for 2 weeks.  Hypertensive. History of hypertension, diabetes, hypercholesterolemia. No trauma. EXAM: CT HEAD WITHOUT CONTRAST CT ANGIOGRAPHY OF THE HEAD TECHNIQUE: Contiguous axial images were obtained from the base of the skull through the vertex without intravenous contrast. Multidetector CT imaging of the head was performed using the standard protocol during bolus administration of intravenous contrast. Multiplanar CT image reconstructions and MIPs were obtained to evaluate the vascular anatomy. CONTRAST:  5034mMNIPAQUE IOHEXOL 350 MG/ML SOLN, 100m35mNIPAQUE IOHEXOL 350 MG/ML SOLN COMPARISON:  CT head April 25, 2011 and CT cervical spine October 19, 2014 FINDINGS: CT HEAD WITHOUT CONTRAST The ventricles and sulci are normal. No intraparenchymal hemorrhage, mass effect nor midline shift. No acute large vascular territory infarcts. No abnormal extra-axial fluid collections. Basal cisterns are patent. No skull fracture. The included ocular globes and orbital contents are non-suspicious. The mastoid aircells and included paranasal sinuses are well-aerated. CT ANGIOGRAPHY OF THE HEAD Aortic arch: Normal  appearance of the thoracic arch, normal branch pattern. The origins of the innominate, left Common carotid artery and subclavian artery are widely patent. Right carotid system: Common carotid artery is widely patent, coursing in a straight line fashion. Eccentric intimal thickening results in LEFT than 50% stenosis RIGHT internal carotid artery origin. Normal appearance of the included internal carotid artery. Left carotid system: Common carotid artery is widely patent, coursing in a straight line fashion. Eccentric intimal thickening results in 7 D percent stenosis LEFT internal carotid artery origin. Normal appearance of the included internal carotid artery. Vertebral arteries:Bilateral vertebral arteries are patent. Habitus limited examination resulting an streak artifact through the neck, with mild luminal irregularity of the  RIGHT greater than LEFT vertebral arteries. Skeleton: No acute osseous process though bone windows have not been submitted. Mild C5-6 disc height loss, ventral endplate spurring compatible with degenerative disc, stable from prior CT. Poor dentition with multiple dental caries, periapical lucency/abscess and absent teeth. Other neck: Soft tissues of the neck are nonacute though, not tailored for evaluation. IMPRESSION: CT HEAD:  No acute intracranial process.  Negative CT head for age. CTA NECK: Approximately 70% stenosis LEFT internal carotid artery origin. Less than 50% stenosis RIGHT internal carotid artery origin. Mild luminal irregularity of the RIGHT greater LEFT vertebral artery's which may represent artifact, atherosclerosis or old injury. No flow limiting stenosis of the vertebral arteries. Electronically Signed   By: Elon Alas M.D.   On: 05/30/2015 21:16   Dg Chest Port 1 View  05/30/2015  CLINICAL DATA:  Chest pain, left arm pain for 2 days EXAM: PORTABLE CHEST 1 VIEW COMPARISON:  10/07/2014 FINDINGS: The heart size and mediastinal contours are within normal limits. Both lungs are clear. The visualized skeletal structures are unremarkable. IMPRESSION: No active disease. Electronically Signed   By: Rolm Baptise M.D.   On: 05/30/2015 18:46   Ct Angio Chest Aorta W/cm &/or Wo/cm  05/30/2015  CLINICAL DATA:  Chest pain for 2 weeks EXAM: CT ANGIOGRAPHY CHEST WITH CONTRAST TECHNIQUE: Multidetector CT imaging of the chest was performed using the standard protocol during bolus administration of intravenous contrast. Multiplanar CT image reconstructions and MIPs were obtained to evaluate the vascular anatomy. CONTRAST:  133m OMNIPAQUE IOHEXOL 350 MG/ML SOLN COMPARISON:  None. FINDINGS: The lungs are well aerated bilaterally. Some minimal dependent changes are noted in the posterior right lower lobe best seen on image number 23 of series 506. Some associated small lymph nodes are noted in the right  hilum measuring approximately 13 mm. Additionally a 19 mm short axis lymph node is noted in the right peritracheal region. The thoracic inlet is within normal limits. The thoracic aorta and its branches are unremarkable. No aneurysm or dissection is identified. The pulmonary artery shows a normal branching pattern. No filling defect to suggest pulmonary embolism is noted. The visualized upper abdomen shows evidence contrast material within the kidneys bilaterally from a recent CT angiogram. Review of the MIP images confirms the above findings. IMPRESSION: No evidence of pulmonary emboli or aortic dissection. Minimal infiltrative changes with associated hilar and mediastinal adenopathy. These changes could be related to underlying sarcoidosis or simply related to acute inflammatory change. Short-term followup in 3-6 months may be helpful. Additionally clinical evaluation could be performed. Electronically Signed   By: MInez CatalinaM.D.   On: 05/30/2015 21:08   I have personally reviewed and evaluated these images and lab results as part of my medical decision-making.   EKG Interpretation   Date/Time:  Friday May 30 2015 18:23:43 EST Ventricular Rate:  135 PR Interval:  173 QRS Duration: 69 QT Interval:  285 QTC Calculation: 427 R Axis:   37 Text Interpretation:  Sinus tachycardia Ventricular premature complex Low  voltage, precordial leads Borderline T wave abnormalities Confirmed by  Hazle Coca [phone]) on 05/30/2015 6:36:59 PM      MDM   Final diagnoses:  None    Patient here for evaluation of headache, chest pain, neck pain. Markedly tachycardic on ED arrival. CT dissection protocol obtained given the description of her symptoms and marked tachycardia. CT scan was stenosis of the carotid artery but no dissection. Terms of her chest pain and this is not consistent with ACS. CT negative for dissection and PE. Her headache was partially improved with headache cocktail. After providing  toradol and additional pain medications her headache and chest pain are completely resolved her tachycardia resolved as well. Discussed with patient importance of follow-up for her sinus tachycardia with cardiology, PCP regarding her CT scan of her chest and neck.  Quintella Reichert, MD 05/31/15 [phone]

## 2015-05-31 NOTE — Discharge Instructions (Signed)
You had multiple studies in the emergency department today including a CT scan of your neck and chest. The CT scan shows narrowing or stenosis of your carotid artery that will need to be followed up by her family doctor. You also have some inflammation in you lungs that will need to be followed up by her family doctor.  Get rechecked immediately if you develop any new or worrisome symptoms.     General Headache Without Cause A headache is pain or discomfort felt around the head or neck area. The specific cause of a headache may not be found. There are many causes and types of headaches. A few common ones are:  Tension headaches.  Migraine headaches.  Cluster headaches.  Chronic daily headaches. HOME CARE INSTRUCTIONS  Watch your condition for any changes. Take these steps to help with your condition: Managing Pain  Take over-the-counter and prescription medicines only as told by your health care provider.  Lie down in a dark, quiet room when you have a headache.  If directed, apply ice to the head and neck area:  Put ice in a plastic bag.  Place a towel between your skin and the bag.  Leave the ice on for 20 minutes, 2-3 times per day.  Use a heating pad or hot shower to apply heat to the head and neck area as told by your health care provider.  Keep lights dim if bright lights bother you or make your headaches worse. Eating and Drinking  Eat meals on a regular schedule.  Limit alcohol use.  Decrease the amount of caffeine you drink, or stop drinking caffeine. General Instructions  Keep all follow-up visits as told by your health care provider. This is important.  Keep a headache journal to help find out what may trigger your headaches. For example, write down:  What you eat and drink.  How much sleep you get.  Any change to your diet or medicines.  Try massage or other relaxation techniques.  Limit stress.  Sit up straight, and do not tense your muscles.  Do  not use tobacco products, including cigarettes, chewing tobacco, or e-cigarettes. If you need help quitting, ask your health care provider.  Exercise regularly as told by your health care provider.  Sleep on a regular schedule. Get 7-9 hours of sleep, or the amount recommended by your health care provider. SEEK MEDICAL CARE IF:   Your symptoms are not helped by medicine.  You have a headache that is different from the usual headache.  You have nausea or you vomit.  You have a fever. SEEK IMMEDIATE MEDICAL CARE IF:   Your headache becomes severe.  You have repeated vomiting.  You have a stiff neck.  You have a loss of vision.  You have problems with speech.  You have pain in the eye or ear.  You have muscular weakness or loss of muscle control.  You lose your balance or have trouble walking.  You feel faint or pass out.  You have confusion.   This information is not intended to replace advice given to you by your health care provider. Make sure you discuss any questions you have with your health care provider.   Document Released: 03/22/2005 Document Revised: 12/11/2014 Document Reviewed: 07/15/2014 Elsevier Interactive Patient Education 2016 [employer]  Nonspecific Tachycardia Tachycardia is a faster than normal heartbeat (more than 100 beats per minute). In adults, the heart normally beats between 60 and 100 times a minute. A fast heartbeat may  be a normal response to exercise or stress. It does not necessarily mean that something is wrong. However, sometimes when your heart beats too fast it may not be able to pump enough blood to the rest of your body. This can result in chest pain, shortness of breath, dizziness, and even fainting. Nonspecific tachycardia means that the specific cause or pattern of your tachycardia is unknown. CAUSES  Tachycardia may be harmless or it may be due to a more serious underlying cause. Possible causes of tachycardia include:  Exercise  or exertion.  Fever.  Pain or injury.  Infection.  Loss of body fluids (dehydration).  Overactive thyroid.  Lack of red blood cells (anemia).  Anxiety and stress.  Alcohol.  Caffeine.  Tobacco products.  Diet pills.  Illegal drugs.  Heart disease. SYMPTOMS  Rapid or irregular heartbeat (palpitations).  Suddenly feeling your heart beating (cardiac awareness).  Dizziness.  Tiredness (fatigue).  Shortness of breath.  Chest pain.  Nausea.  Fainting. DIAGNOSIS  Your caregiver will perform a physical exam and take your medical history. In some cases, a heart specialist (cardiologist) may be consulted. Your caregiver may also order:  Blood tests.  Electrocardiography. This test records the electrical activity of your heart.  A heart monitoring test. TREATMENT  Treatment will depend on the likely cause of your tachycardia. The goal is to treat the underlying cause of your tachycardia. Treatment methods may include:  Replacement of fluids or blood through an intravenous (IV) tube for moderate to severe dehydration or anemia.  New medicines or changes in your current medicines.  Diet and lifestyle changes.  Treatment for certain infections.  Stress relief or relaxation methods. HOME CARE INSTRUCTIONS   Rest.  Drink enough fluids to keep your urine clear or pale yellow.  Do not smoke.  Avoid:  Caffeine.  Tobacco.  Alcohol.  Chocolate.  Stimulants such as over-the-counter diet pills or pills that help you stay awake.  Situations that cause anxiety or stress.  Illegal drugs such as marijuana, phencyclidine (PCP), and cocaine.  Only take medicine as directed by your caregiver.  Keep all follow-up appointments as directed by your caregiver. SEEK IMMEDIATE MEDICAL CARE IF:   You have pain in your chest, upper arms, jaw, or neck.  You become weak, dizzy, or feel faint.  You have palpitations that will not go away.  You vomit, have  diarrhea, or pass blood in your stool.  Your skin is cool, pale, and wet.  You have a fever that will not go away with rest, fluids, and medicine. MAKE SURE YOU:   Understand these instructions.  Will watch your condition.  Will get help right away if you are not doing well or get worse.   This information is not intended to replace advice given to you by your health care provider. Make sure you discuss any questions you have with your health care provider.   Document Released: 04/29/2004 Document Revised: 06/14/2011 Document Reviewed: 10/04/2014 Elsevier Interactive Patient Education [employer].

## 2015-06-10 ENCOUNTER — Encounter (HOSPITAL_COMMUNITY): Payer: Self-pay | Admitting: Emergency Medicine

## 2015-06-10 ENCOUNTER — Emergency Department (HOSPITAL_COMMUNITY)
Admission: EM | Admit: 2015-06-10 | Discharge: 2015-06-10 | Disposition: A | Discharge status: Home / Self Care | Payer: BC Managed Care – PPO | Attending: Emergency Medicine | Admitting: Emergency Medicine

## 2015-06-10 DIAGNOSIS — J029 Acute pharyngitis, unspecified: Secondary | ICD-10-CM | POA: Diagnosis present

## 2015-06-10 DIAGNOSIS — E119 Type 2 diabetes mellitus without complications: Secondary | ICD-10-CM | POA: Insufficient documentation

## 2015-06-10 DIAGNOSIS — J020 Streptococcal pharyngitis: Secondary | ICD-10-CM | POA: Insufficient documentation

## 2015-06-10 DIAGNOSIS — E669 Obesity, unspecified: Secondary | ICD-10-CM | POA: Insufficient documentation

## 2015-06-10 DIAGNOSIS — Z7982 Long term (current) use of aspirin: Secondary | ICD-10-CM | POA: Insufficient documentation

## 2015-06-10 DIAGNOSIS — Z794 Long term (current) use of insulin: Secondary | ICD-10-CM | POA: Diagnosis not present

## 2015-06-10 DIAGNOSIS — J45909 Unspecified asthma, uncomplicated: Secondary | ICD-10-CM | POA: Insufficient documentation

## 2015-06-10 DIAGNOSIS — Z79899 Other long term (current) drug therapy: Secondary | ICD-10-CM | POA: Diagnosis not present

## 2015-06-10 DIAGNOSIS — Z7984 Long term (current) use of oral hypoglycemic drugs: Secondary | ICD-10-CM | POA: Insufficient documentation

## 2015-06-10 DIAGNOSIS — E7800 Pure hypercholesterolemia, unspecified: Secondary | ICD-10-CM | POA: Insufficient documentation

## 2015-06-10 DIAGNOSIS — I10 Essential (primary) hypertension: Secondary | ICD-10-CM | POA: Diagnosis not present

## 2015-06-10 HISTORY — DX: Obesity, unspecified: E66.9

## 2015-06-10 LAB — RAPID STREP SCREEN (MED CTR MEBANE ONLY): STREPTOCOCCUS, GROUP A SCREEN (DIRECT): POSITIVE — AB

## 2015-06-10 MED ORDER — HYDROCODONE-ACETAMINOPHEN 7.5-325 MG/15ML PO SOLN: 15.0000 mL | Freq: Three times a day (TID) | ORAL | Status: DC | PRN

## 2015-06-10 MED ORDER — GI COCKTAIL ~~LOC~~
30.0000 mL | Freq: Once | ORAL | Status: AC
  Administered 2015-06-10: 30 mL via ORAL
  Filled 2015-06-10: qty 30

## 2015-06-10 MED ORDER — PENICILLIN G BENZATHINE 1200000 UNIT/2ML IM SUSP
1.2000 10*6.[IU] | Freq: Once | INTRAMUSCULAR | Status: AC
  Administered 2015-06-10: 1.2 10*6.[IU] via INTRAMUSCULAR
  Filled 2015-06-10: qty 2

## 2015-06-10 MED ORDER — IBUPROFEN 600 MG PO TABS: 600.0000 mg | ORAL_TABLET | Freq: Four times a day (QID) | ORAL | Status: DC | PRN

## 2015-06-10 MED ORDER — IBUPROFEN 400 MG PO TABS
800.0000 mg | ORAL_TABLET | Freq: Once | ORAL | Status: AC
  Administered 2015-06-10: 800 mg via ORAL
  Filled 2015-06-10: qty 2

## 2015-06-10 NOTE — Discharge Instructions (Signed)

## 2015-06-10 NOTE — ED Provider Notes (Signed)
CSN: [id number]     Arrival date & time 06/10/15  0058 History   First MD Initiated Contact with Patient 06/10/15 0157     Chief Complaint  Patient presents with  . Sore Throat     (Consider location/radiation/quality/duration/timing/severity/associated sxs/prior Treatment) Patient is a 50 y.o. female presenting with pharyngitis. The history is provided by the patient. No language interpreter was used.  Sore Throat This is a new problem. The current episode started yesterday. The problem occurs constantly. The problem has been gradually worsening. Associated symptoms include a sore throat. Pertinent negatives include no congestion, coughing, fever, neck pain, rash or vomiting. The symptoms are aggravated by swallowing. Treatments tried: warm tea and gargling salt water with vinegar. The treatment provided no relief.    Past Medical History  Diagnosis Date  . Diabetes mellitus   . Hypertension   . Hypercholesteremia   . Asthma   . Obese    Past Surgical History  Procedure Laterality Date  . Cesarean section    . Tubal ligation     No family history on file. Social History  Substance Use Topics  . Smoking status: Never Smoker   . Smokeless tobacco: Never Used  . Alcohol Use: No   OB History    No data available      Review of Systems  Constitutional: Negative for fever.  HENT: Positive for sore throat. Negative for congestion.   Respiratory: Negative for cough.   Gastrointestinal: Negative for vomiting.  Musculoskeletal: Negative for neck pain.  Skin: Negative for rash.    Allergies  Review of patient's allergies indicates no known allergies.  Home Medications   Prior to Admission medications   Medication Sig Start Date End Date Taking? Authorizing Provider  aspirin 81 MG tablet Take 81 mg by mouth daily.    Historical Provider, MD  cetirizine (ZYRTEC) 10 MG tablet Take 10 mg by mouth daily.    Historical Provider, MD  cyclobenzaprine (FLEXERIL) 10 MG tablet  Take 1 tablet (10 mg total) by mouth 2 (two) times daily as needed for muscle spasms. Patient not taking: Reported on 10/19/2014 05/09/13   Wandra Arthurs, MD  cyclobenzaprine (FLEXERIL) 10 MG tablet Take 1 tablet (10 mg total) by mouth 3 (three) times daily as needed for muscle spasms. 10/07/14   Mercedes Camprubi-Soms, PA-C  gabapentin (NEURONTIN) 300 MG capsule Take 300-600 mg by mouth 3 (three) times daily. Take 1 capsule (300 mg) by mouth daily every morning and at 3pm, take 2 capsules (600 mg) at bedtime    Historical Provider, MD  HYDROcodone-acetaminophen (HYCET) 7.5-325 mg/15 ml solution Take 15 mLs by mouth every 8 (eight) hours as needed for moderate pain. 06/10/15   Antonietta Breach, PA-C  ibuprofen (ADVIL,MOTRIN) 600 MG tablet Take 1 tablet (600 mg total) by mouth every 6 (six) hours as needed. 06/10/15   Antonietta Breach, PA-C  Insulin Degludec (TRESIBA FLEXTOUCH) 200 UNIT/ML SOPN Inject 100 Units into the skin daily before breakfast.     Historical Provider, MD  Liraglutide (VICTOZA) 18 MG/3ML SOPN Inject 1.8 mg into the skin daily.    Historical Provider, MD  losartan-hydrochlorothiazide (HYZAAR) 100-12.5 MG per tablet Take 1 tablet by mouth daily.    Historical Provider, MD  simvastatin (ZOCOR) 20 MG tablet Take 20 mg by mouth daily at 6 PM.    Historical Provider, MD  sitaGLIPtin-metformin (JANUMET) 50-1000 MG per tablet Take 1 tablet by mouth 2 (two) times daily with a meal. Patient taking differently: Take  1 tablet by mouth daily.  01/30/13   Hazel Sams, PA-C  Vitamin D, Ergocalciferol, (DRISDOL) 50000 UNITS CAPS capsule Take 50,000 Units by mouth every 7 (seven) days. Monday    Historical Provider, MD   BP 139/81 mmHg  Pulse 109  Temp(Src) 99.1 F (37.3 C) (Oral)  Resp 18  Ht '5\' 9"'$  (1.753 m)  Wt 136.533 kg  BMI 44.43 kg/m2  SpO2 97%  LMP 02/06/2015   Physical Exam  Constitutional: She is oriented to person, place, and time. She appears well-developed and well-nourished. No distress.   Nontoxic/nonseptic appearing.  HENT:  Head: Normocephalic and atraumatic.  Posterior oropharyngeal erythema. Mild edema and exudates. Uvula midline. Patient tolerating secretions without difficulty.   Eyes: Conjunctivae and EOM are normal. No scleral icterus.  Neck: Normal range of motion.  No nuchal rigidity or meningismus.  Pulmonary/Chest: Effort normal. No respiratory distress.  Respirations even and unlabored.  Musculoskeletal: Normal range of motion.  Neurological: She is alert and oriented to person, place, and time. She exhibits normal muscle tone. Coordination normal.  Skin: Skin is warm and dry. No rash noted. She is not diaphoretic. No erythema. No pallor.  Psychiatric: She has a normal mood and affect. Her behavior is normal.  Nursing note and vitals reviewed.   ED Course  Procedures (including critical care time) Labs Review Labs Reviewed  RAPID STREP SCREEN (NOT AT [facility]) - Abnormal; Notable for the following:    Streptococcus, Group A Screen (Direct) POSITIVE (*)    All other components within normal limits    Imaging Review No results found.   I have personally reviewed and evaluated these images and lab results as part of my medical decision-making.   EKG Interpretation None       Medications  penicillin g benzathine (BICILLIN LA) 1200000 UNIT/2ML injection 1.2 Million Units (not administered)  gi cocktail (Maalox,Lidocaine,Donnatal) (not administered)  ibuprofen (ADVIL,MOTRIN) tablet 800 mg (not administered)    MDM   Final diagnoses:  Strep pharyngitis    Pt afebrile with tonsillar exudate, cervical lymphadenopathy, and dysphagia; diagnosis of strep. Treated in the ED with NSAIDs, GI cocktail, and PCN IM. Presentation not concerning for PTA or infxn spread to soft tissue. No trismus or uvula deviation. Recommended PCP follow up. Return precautions discussed and provided; discharged in good condition with no unaddressed concerns.    Antonietta Breach,  PA-C 06/10/15 0225  Everlene Balls, MD 06/10/15 [phone]

## 2015-06-10 NOTE — ED Notes (Signed)
Pt. reports sore throat onset Sunday , denies cough , no fever or chills , airway intact / respirations unlabored.

## 2015-09-04 IMAGING — DX DG CHEST 2V
2 series · 2 of 2 positions shown · non-contrast
Comparison: 05/09/2013

CLINICAL DATA: Chest pain since yesterday.

EXAM:
CHEST  2 VIEW

[chest pa]
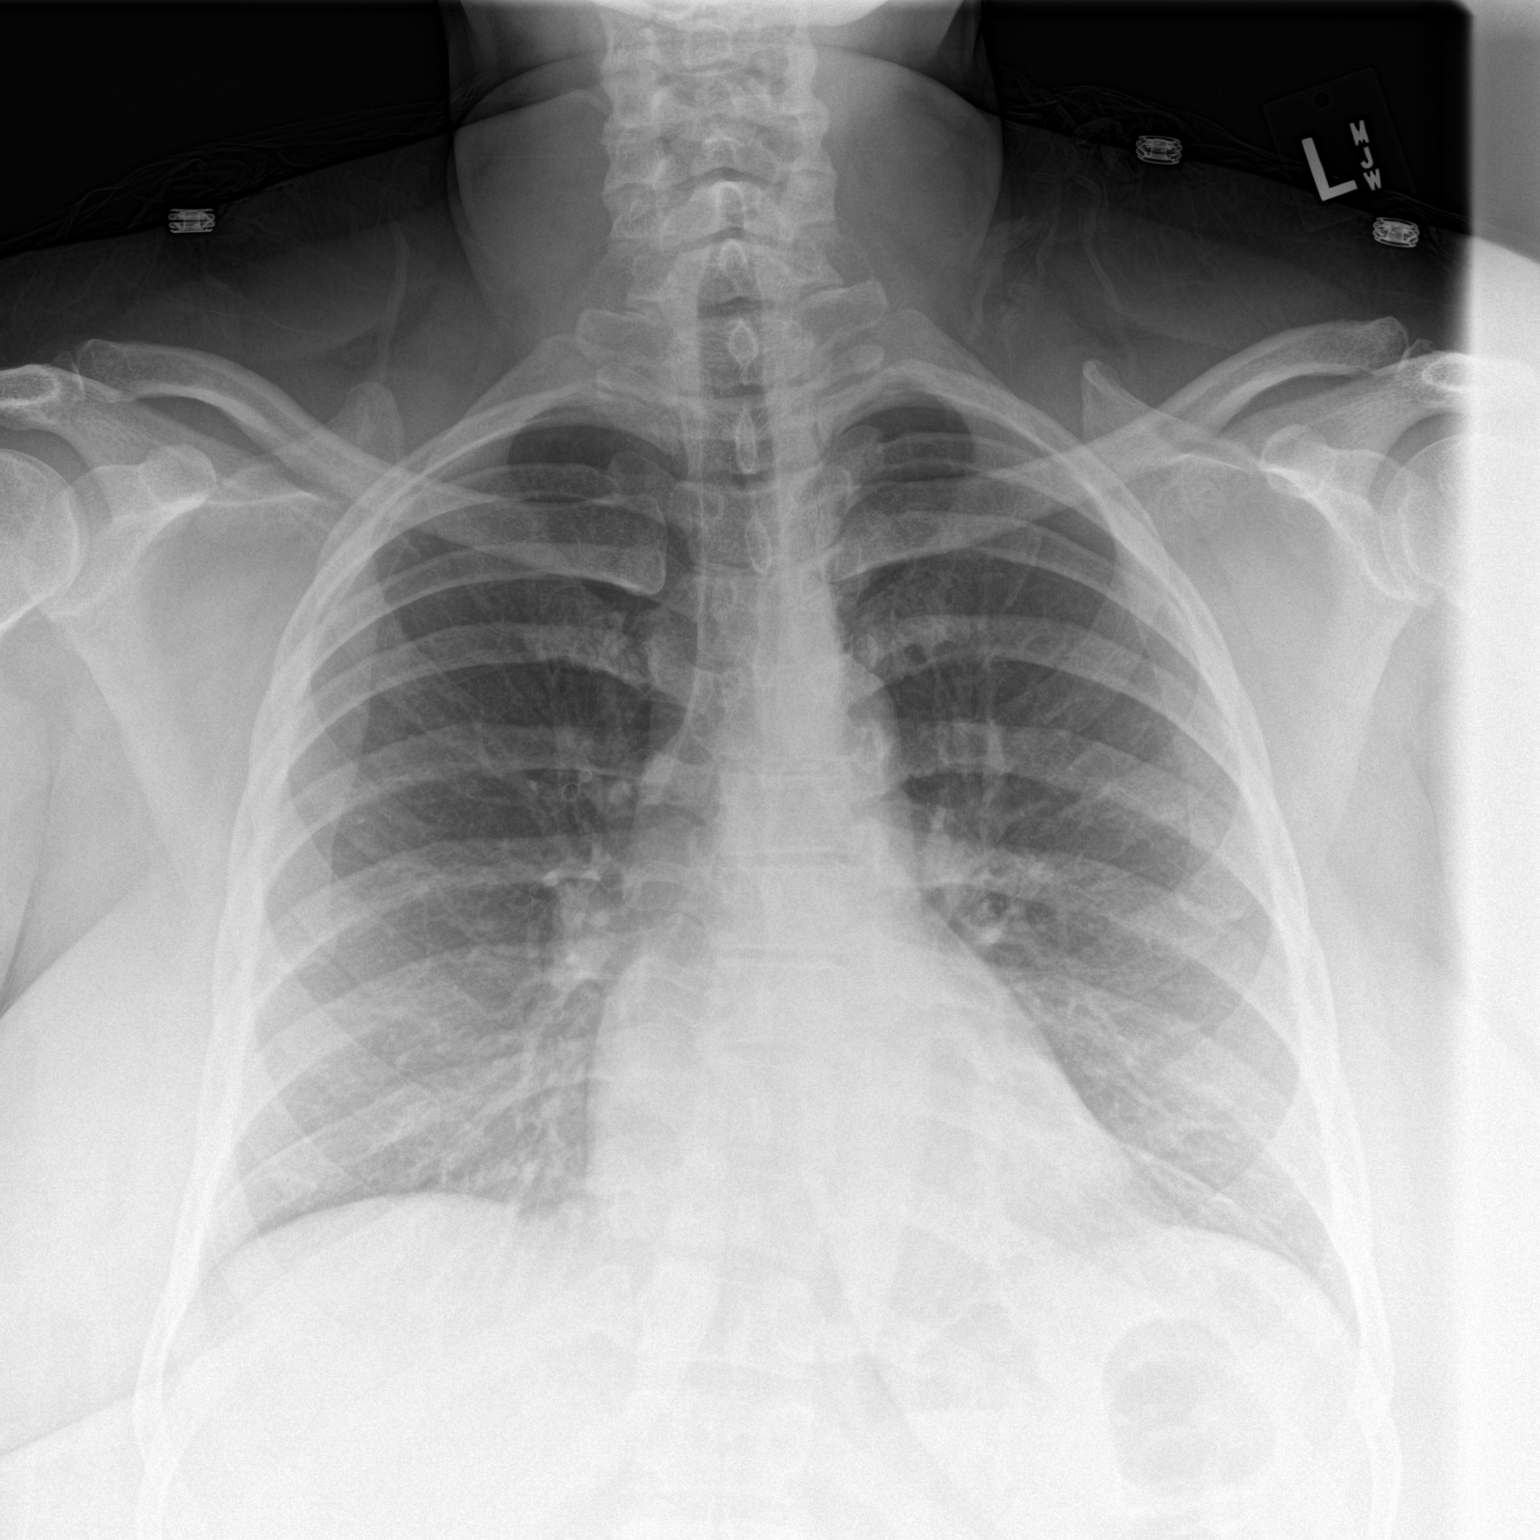

[chest lat]
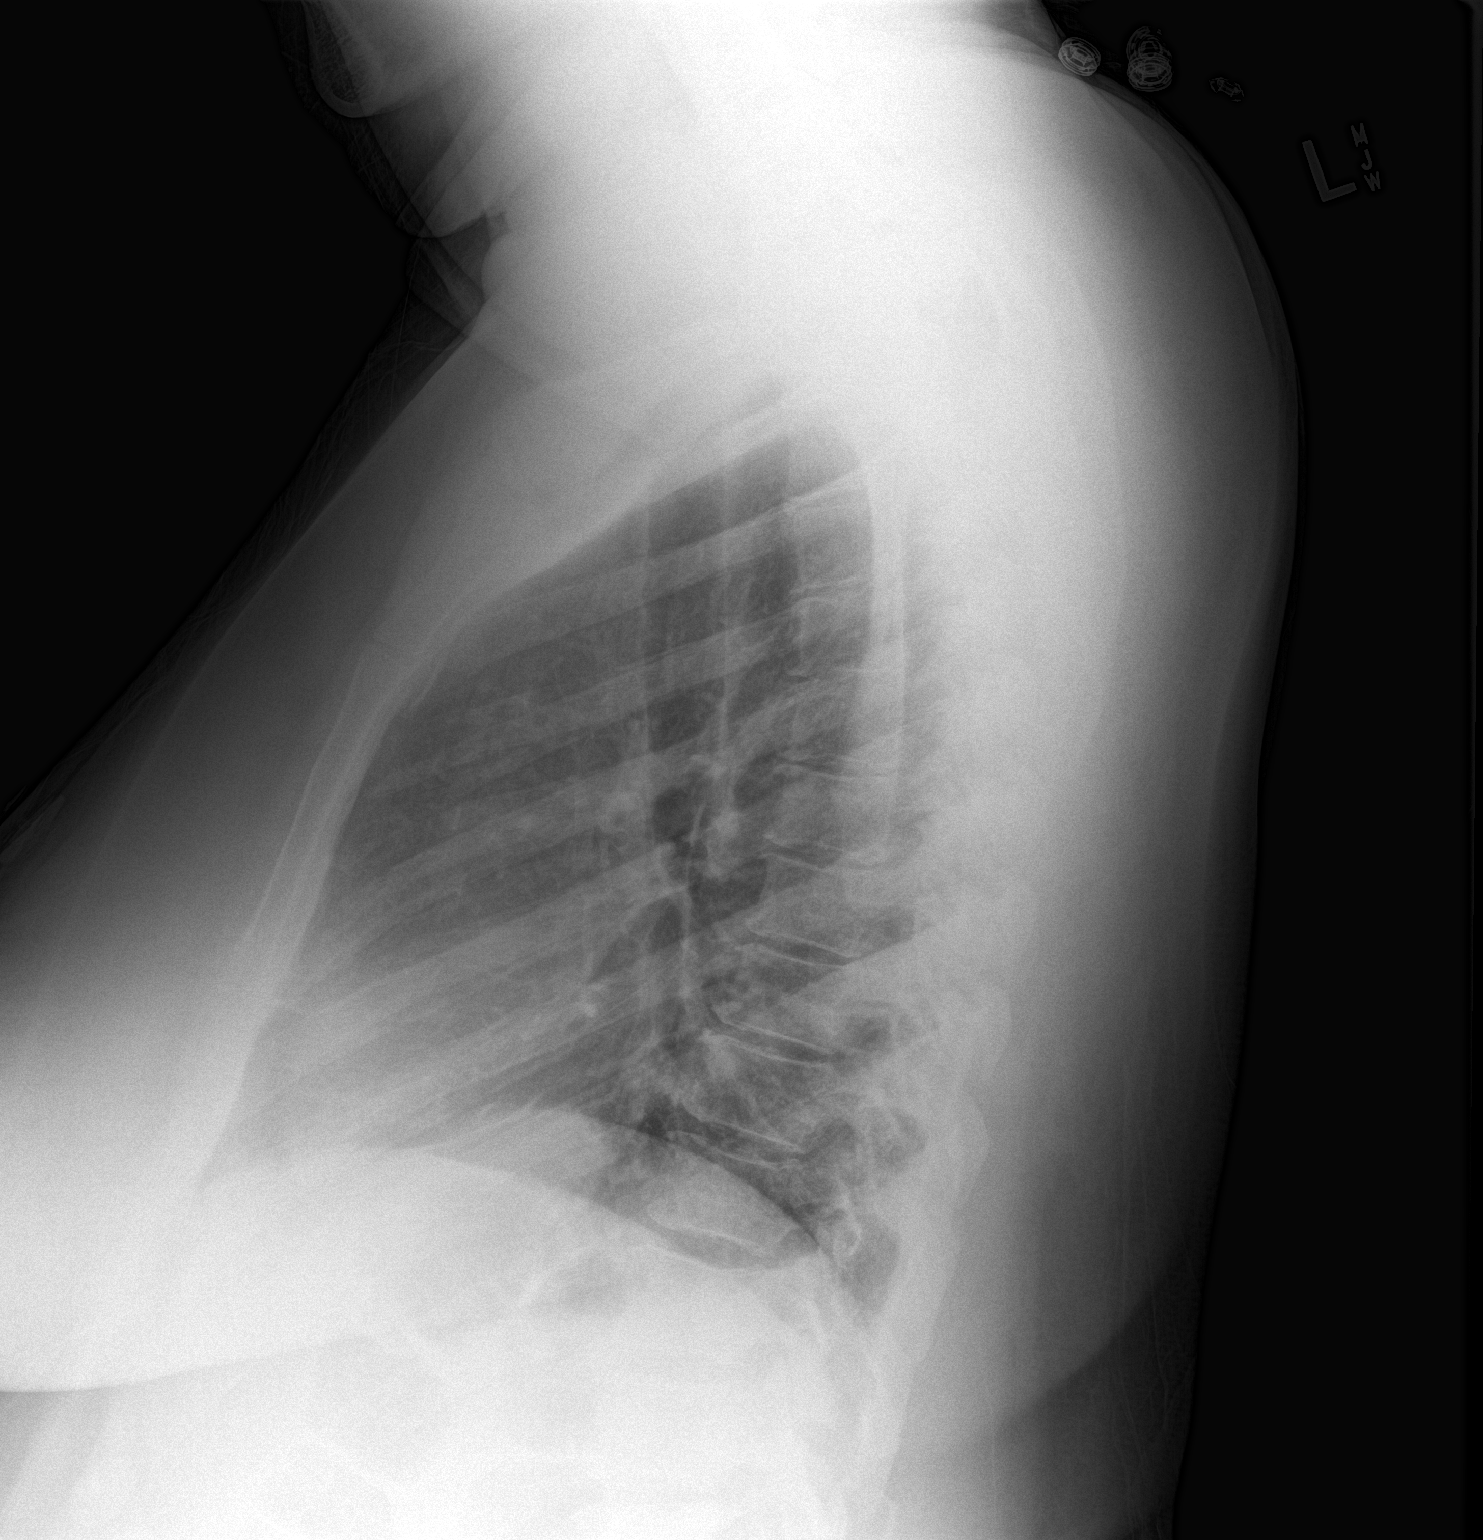

[2 of 2 positions shown; findings below may reference images not displayed]

FINDINGS: The heart size and mediastinal contours are within normal limits.
Both lungs are clear. The visualized skeletal structures are
unremarkable.
IMPRESSION: Normal chest x-ray.

## 2015-09-15 ENCOUNTER — Emergency Department (HOSPITAL_COMMUNITY): Payer: BC Managed Care – PPO

## 2015-09-15 ENCOUNTER — Emergency Department (HOSPITAL_COMMUNITY)
Admission: EM | Admit: 2015-09-15 | Discharge: 2015-09-15 | Disposition: A | Discharge status: Home / Self Care | Payer: BC Managed Care – PPO | Attending: Emergency Medicine | Admitting: Emergency Medicine

## 2015-09-15 ENCOUNTER — Encounter (HOSPITAL_COMMUNITY): Payer: Self-pay

## 2015-09-15 DIAGNOSIS — E669 Obesity, unspecified: Secondary | ICD-10-CM | POA: Insufficient documentation

## 2015-09-15 DIAGNOSIS — J45909 Unspecified asthma, uncomplicated: Secondary | ICD-10-CM | POA: Diagnosis not present

## 2015-09-15 DIAGNOSIS — Z7982 Long term (current) use of aspirin: Secondary | ICD-10-CM | POA: Insufficient documentation

## 2015-09-15 DIAGNOSIS — Z7984 Long term (current) use of oral hypoglycemic drugs: Secondary | ICD-10-CM | POA: Insufficient documentation

## 2015-09-15 DIAGNOSIS — I10 Essential (primary) hypertension: Secondary | ICD-10-CM | POA: Insufficient documentation

## 2015-09-15 DIAGNOSIS — R0789 Other chest pain: Secondary | ICD-10-CM | POA: Diagnosis present

## 2015-09-15 DIAGNOSIS — Z79899 Other long term (current) drug therapy: Secondary | ICD-10-CM | POA: Insufficient documentation

## 2015-09-15 DIAGNOSIS — E119 Type 2 diabetes mellitus without complications: Secondary | ICD-10-CM | POA: Diagnosis not present

## 2015-09-15 DIAGNOSIS — Z6841 Body Mass Index (BMI) 40.0 and over, adult: Secondary | ICD-10-CM | POA: Diagnosis not present

## 2015-09-15 DIAGNOSIS — Z794 Long term (current) use of insulin: Secondary | ICD-10-CM | POA: Insufficient documentation

## 2015-09-15 LAB — CBC
HEMATOCRIT: 33.5 % — AB (ref 36.0–46.0)
Hemoglobin: 10.5 g/dL — ABNORMAL LOW (ref 12.0–15.0)
MCH: 25.7 pg — AB (ref 26.0–34.0)
MCHC: 31.3 g/dL (ref 30.0–36.0)
MCV: 81.9 fL (ref 78.0–100.0)
Platelets: 372 10*3/uL (ref 150–400)
RBC: 4.09 MIL/uL (ref 3.87–5.11)
RDW: 12.7 % (ref 11.5–15.5)
WBC: 8.2 10*3/uL (ref 4.0–10.5)

## 2015-09-15 LAB — BASIC METABOLIC PANEL
Anion gap: 8 (ref 5–15)
BUN: 7 mg/dL (ref 6–20)
CHLORIDE: 103 mmol/L (ref 101–111)
CO2: 27 mmol/L (ref 22–32)
Calcium: 9.3 mg/dL (ref 8.9–10.3)
Creatinine, Ser: 0.52 mg/dL (ref 0.44–1.00)
GFR calc Af Amer: >60 mL/min (ref >60)
GFR calc non Af Amer: >60 mL/min (ref >60)
GLUCOSE: 216 mg/dL — AB (ref 65–99)
POTASSIUM: 3.7 mmol/L (ref 3.5–5.1)
SODIUM: 138 mmol/L (ref 135–145)

## 2015-09-15 LAB — I-STAT TROPONIN, ED
TROPONIN I, POC: 0.01 ng/mL (ref 0.00–0.08)
Troponin i, poc: 0 ng/mL (ref 0.00–0.08)

## 2015-09-15 MED ORDER — FAMOTIDINE 20 MG PO TABS: 20.0000 mg | ORAL_TABLET | Freq: Two times a day (BID) | ORAL | Status: DC

## 2015-09-15 MED ORDER — GI COCKTAIL ~~LOC~~
30.0000 mL | Freq: Once | ORAL | Status: AC
  Administered 2015-09-15: 30 mL via ORAL
  Filled 2015-09-15: qty 30

## 2015-09-15 NOTE — Discharge Instructions (Signed)
Follow-up with your primary care provider within 2 days to be reevaluated for your chest wall pain. Take Pepcid as prescribed for acid reflux.  Your chest x-ray was concerning for sarcoidosis. Follow-up with your primary care provider to have further testing done.  Return to the emergency department if your chest pain becomes exertional, associated with sweating, dizziness, shortness of breath, nausea, vomiting, or you pass out.  Chest Wall Pain Chest wall pain is pain in or around the bones and muscles of your chest. Sometimes, an injury causes this pain. Sometimes, the cause may not be known. This pain may take several weeks or longer to get better. HOME CARE INSTRUCTIONS  Pay attention to any changes in your symptoms. Take these actions to help with your pain:   Rest as told by your health care provider.   Avoid activities that cause pain. These include any activities that use your chest muscles or your abdominal and side muscles to lift heavy items.   If directed, apply ice to the painful area:  Put ice in a plastic bag.  Place a towel between your skin and the bag.  Leave the ice on for 20 minutes, 2-3 times per day.  Take over-the-counter and prescription medicines only as told by your health care provider.  Do not use tobacco products, including cigarettes, chewing tobacco, and e-cigarettes. If you need help quitting, ask your health care provider.  Keep all follow-up visits as told by your health care provider. This is important. SEEK MEDICAL CARE IF:  You have a fever.  Your chest pain becomes worse.  You have new symptoms. SEEK IMMEDIATE MEDICAL CARE IF:  You have nausea or vomiting.  You feel sweaty or light-headed.  You have a cough with phlegm (sputum) or you cough up blood.  You develop shortness of breath.   This information is not intended to replace advice given to you by your health care provider. Make sure you discuss any questions you have with your  health care provider.   Document Released: 03/22/2005 Document Revised: 12/11/2014 Document Reviewed: 06/17/2014 Elsevier Interactive Patient Education [employer].

## 2015-09-15 NOTE — ED Notes (Addendum)
Pt. States that she woke up with L.side chest pain today. No cardiac history. HR 137 in triage along with shortness of breath that started around 3:15

## 2015-09-15 NOTE — ED Provider Notes (Signed)
CSN: [id number]     Arrival date & time 09/15/15  1609 History   First MD Initiated Contact with Patient 09/15/15 1859     Chief Complaint  Patient presents with  . Chest Pain     (Consider location/radiation/quality/duration/timing/severity/associated sxs/prior Treatment) HPI   Patient is a 50 year old female with a history of DM, HTN, hypercholesterolemia, asthma, obesity who presents the ED with chest pain since 6 AM this morning. She states the pain is on the left side of her chest feels like a pressure/heaviness that has progressively gotten worse since this morning. The pain is constant, nonradiating, 9/10. She states the pain is worse with burping, worse with deep breath, and pushing on her chest makes it worse. She states 3 episodes of intermittent shortness of breath. She states her shortness of breath was due to pain in her chest, caused her to take short breaths and she could not take a deep breath. These episodes lasted less than 20 minutes. She denies nausea, vomiting, abdominal pain, diarrhea, dysuria, hematuria, change in bowels, dizziness, headache. Patient states she has a history of acid reflux but does not take anything for it.  Past Medical History  Diagnosis Date  . Diabetes mellitus   . Hypertension   . Hypercholesteremia   . Asthma   . Obese    Past Surgical History  Procedure Laterality Date  . Cesarean section    . Tubal ligation     No family history on file. Social History  Substance Use Topics  . Smoking status: Never Smoker   . Smokeless tobacco: Never Used  . Alcohol Use: No   OB History    No data available     Review of Systems  Constitutional: Negative for fever and chills.  HENT: Negative for trouble swallowing.   Eyes: Negative for visual disturbance.  Respiratory: Positive for shortness of breath.   Cardiovascular: Positive for chest pain.  Gastrointestinal: Negative for nausea, vomiting, abdominal pain and diarrhea.  Genitourinary:  Negative for dysuria and hematuria.  Musculoskeletal: Negative for back pain and neck pain.  Neurological: Negative for dizziness, syncope, weakness, light-headedness and numbness.  Psychiatric/Behavioral: Negative for confusion.      Allergies  Review of patient's allergies indicates no known allergies.  Home Medications   Prior to Admission medications   Medication Sig Start Date End Date Taking? Authorizing Provider  aspirin 81 MG tablet Take 81 mg by mouth daily.   Yes Historical Provider, MD  cetirizine (ZYRTEC) 10 MG tablet Take 10 mg by mouth daily.   Yes Historical Provider, MD  cyclobenzaprine (FLEXERIL) 10 MG tablet Take 1 tablet (10 mg total) by mouth 3 (three) times daily as needed for muscle spasms. 10/07/14  Yes Mercedes Camprubi-Soms, PA-C  gabapentin (NEURONTIN) 300 MG capsule Take 300-600 mg by mouth 3 (three) times daily. Take 1 capsule (300 mg) by mouth daily every morning and at 3pm, take 2 capsules (600 mg) at bedtime   Yes Historical Provider, MD  ibuprofen (ADVIL,MOTRIN) 600 MG tablet Take 1 tablet (600 mg total) by mouth every 6 (six) hours as needed. Patient taking differently: Take 600 mg by mouth every 6 (six) hours as needed for moderate pain.  06/10/15  Yes Antonietta Breach, PA-C  Insulin Degludec (TRESIBA FLEXTOUCH) 200 UNIT/ML SOPN Inject 100 Units into the skin daily before breakfast.    Yes Historical Provider, MD  Liraglutide (VICTOZA) 18 MG/3ML SOPN Inject 1.8 mg into the skin daily.   Yes Historical Provider, MD  losartan-hydrochlorothiazide (  HYZAAR) 100-12.5 MG per tablet Take 1 tablet by mouth daily.   Yes Historical Provider, MD  simvastatin (ZOCOR) 20 MG tablet Take 20 mg by mouth daily at 6 PM.   Yes Historical Provider, MD  sitaGLIPtin-metformin (JANUMET) 50-1000 MG per tablet Take 1 tablet by mouth 2 (two) times daily with a meal. Patient taking differently: Take 1 tablet by mouth daily.  01/30/13  Yes Peter Dammen, PA-C  Vitamin D, Ergocalciferol,  (DRISDOL) 50000 UNITS CAPS capsule Take 50,000 Units by mouth every 7 (seven) days. Monday   Yes Historical Provider, MD  famotidine (PEPCID) 20 MG tablet Take 1 tablet (20 mg total) by mouth 2 (two) times daily. 09/15/15   Kalman Drape, PA  HYDROcodone-acetaminophen (HYCET) 7.5-325 mg/15 ml solution Take 15 mLs by mouth every 8 (eight) hours as needed for moderate pain. 06/10/15   Antonietta Breach, PA-C   BP 137/77 mmHg  Pulse 97  Temp(Src) 98.8 F (37.1 C) (Oral)  Resp 16  Ht '5\' 9"'$  (1.753 m)  Wt 135.172 kg  BMI 43.99 kg/m2  SpO2 100%  LMP 07/16/2015 Physical Exam  Constitutional: She appears well-developed and well-nourished. No distress.  HENT:  Head: Normocephalic and atraumatic.  Eyes: Conjunctivae are normal.  Neck: Normal range of motion.  Cardiovascular: Normal rate, regular rhythm and normal heart sounds.  Exam reveals no gallop and no friction rub.   No murmur heard. Pulmonary/Chest: Effort normal and breath sounds normal. No respiratory distress. She has no wheezes. She has no rales. She exhibits tenderness.    Abdominal: Soft. She exhibits no distension. There is no tenderness.  Musculoskeletal: Normal range of motion. She exhibits no edema.  Neurological: She is alert. Coordination normal.  Skin: Skin is warm and dry.  Psychiatric: She has a normal mood and affect. Her behavior is normal.    ED Course  Procedures (including critical care time) Labs Review Labs Reviewed  BASIC METABOLIC PANEL - Abnormal; Notable for the following:    Glucose, Bld 216 (*)    All other components within normal limits  CBC - Abnormal; Notable for the following:    Hemoglobin 10.5 (*)    HCT 33.5 (*)    MCH 25.7 (*)    All other components within normal limits  I-STAT TROPOININ, ED  Randolm Idol, ED    Imaging Review Dg Chest 2 View  09/15/2015  CLINICAL DATA:  Left-sided chest pain and shortness of Breath EXAM: CHEST  2 VIEW COMPARISON:  05/30/2015 FINDINGS: Cardiac shadow is  within normal limits. Fullness in the right peritracheal region is again identified similar to that seen on prior CT examination consistent with mediastinal adenopathy. No focal infiltrate or sizable effusion is seen. No bony abnormality is noted. IMPRESSION: Changes consistent with mediastinal adenopathy similar to that noted on prior CT. This again likely represents sarcoidosis. Electronically Signed   By: Inez Catalina M.D.   On: 09/15/2015 17:11   I have personally reviewed and evaluated these images and lab results as part of my medical decision-making.   EKG Interpretation   Date/Time:  Monday September 15 2015 16:21:04 EDT Ventricular Rate:  139 PR Interval:  142 QRS Duration: 82 QT Interval:  270 QTC Calculation: 410 R Axis:   35 Text Interpretation:  Sinus tachycardia Nonspecific T wave abnormality  Abnormal ECG Confirmed by Hazle Coca [phone]) on 09/15/2015 6:41:17 PM      MDM   Final diagnoses:  Chest wall pain   Patient is to be discharged with  recommendation to follow up with PCP in regards to today's hospital visit. Chest pain is not likely of cardiac or pulmonary etiology d/t presentation, VSS, no tracheal deviation, no JVD or new murmur, RRR, breath sounds equal bilaterally, EKG without acute abnormalities, negative troponin, and negative CXR.Patient's chest wall pain was also reproducible upon exam less concerning for PE or cardiac etiology. Pt's pain improved after a GI cocktail. Pt has been advised start a H2 blocker or PPI and return to the ED is CP becomes exertional, associated with diaphoresis or nausea, radiates to left jaw/arm, worsens or becomes concerning in any way. Hemoglobin found to be low although this is close to patient's baseline.  Patient's chest x-ray was concerning for sarcoidosis, I instructed the patient to follow-up with her primary care provider regarding these findings and have further testing done.  Instructed the patient follow up with her primary care  provider within 2 days and I discussed strict return precautions. Pt appears reliable for follow up and is agreeable to discharge.   Case has been discussed with and seen by Dr. Ralene Bathe who agrees with the above plan to discharge.     Kalman Drape, [place] 09/16/15 0028  Quintella Reichert, MD 09/17/15 [phone]

## 2015-09-15 NOTE — ED Notes (Signed)
Pt found in upright position c/o lower back pain from laying down too long. HR found to be elevated on monitor as well as palpated; pt denied weakness, dizziness, headache, anxiety. Pt coached to take deep breaths and HR decreased.

## 2015-09-16 IMAGING — CT CT CERVICAL SPINE W/O CM
3 series · 16 of 33 positions shown, 19 images · non-contrast
Comparison: None.

CLINICAL DATA: Pt arrived to ED with c/o left back/shoulder blade
pain that started 2 weeks ago that radiates to fingers. Pt was seen
2 weeks ago for same symptoms and discharged with pain medication

EXAM:
CT CERVICAL SPINE WITHOUT CONTRAST
TECHNIQUE: Multidetector CT imaging of the cervical spine was performed without
intravenous contrast. Multiplanar CT image reconstructions were also
generated.

[Series 3: c_spine 2.0 i30s 3 · axial · 0.33mm/px · z∈[-238,-94]mm · 8 of 86 slices shown, 10 images]
[im 7/86  soft-tissue]
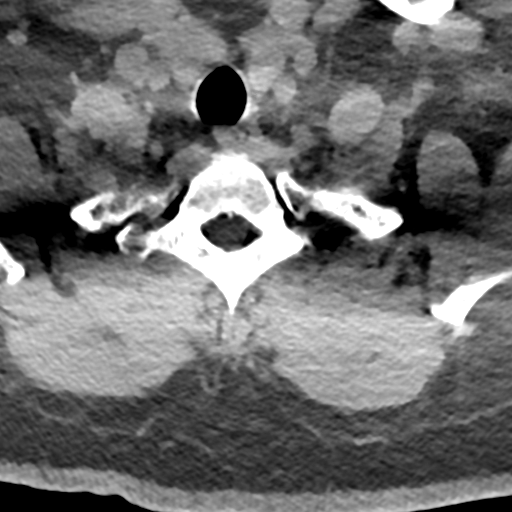
[im 7/86  bone]
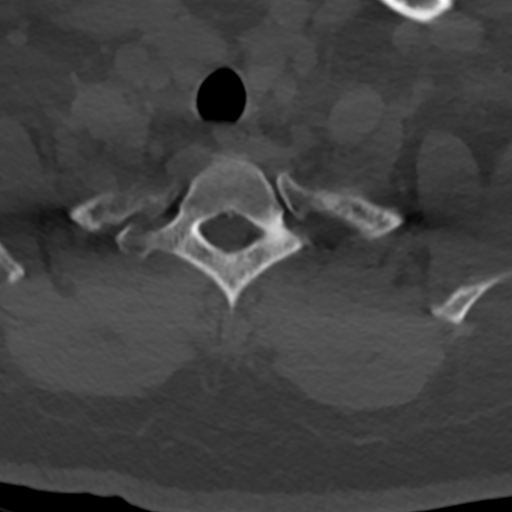
[im 20/86  bone]
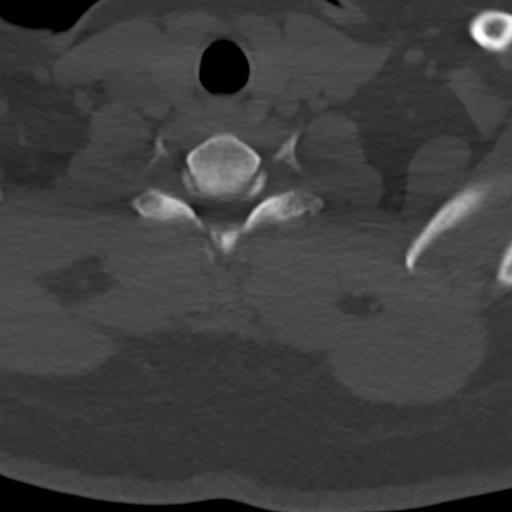
[im 27/86  bone]
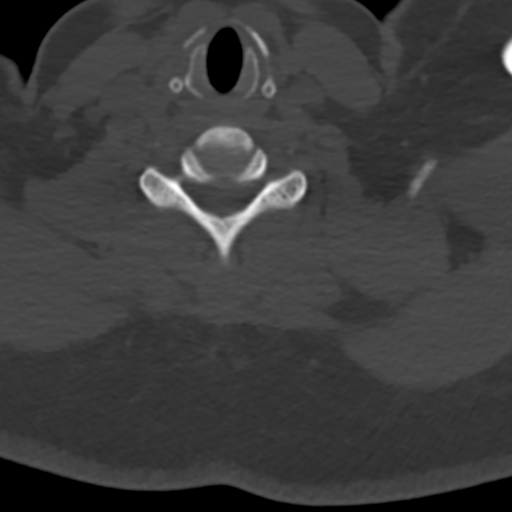
[im 40/86  bone]
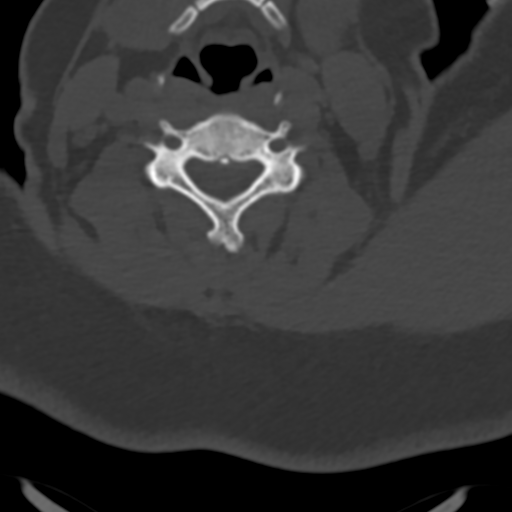
[im 46/86  soft-tissue]
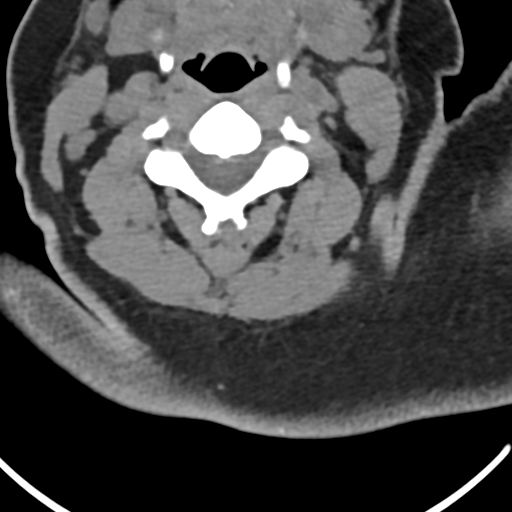
[im 46/86  bone]
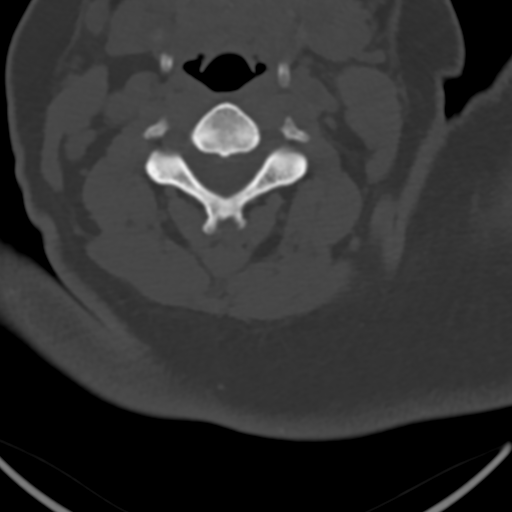
[im 59/86  bone]
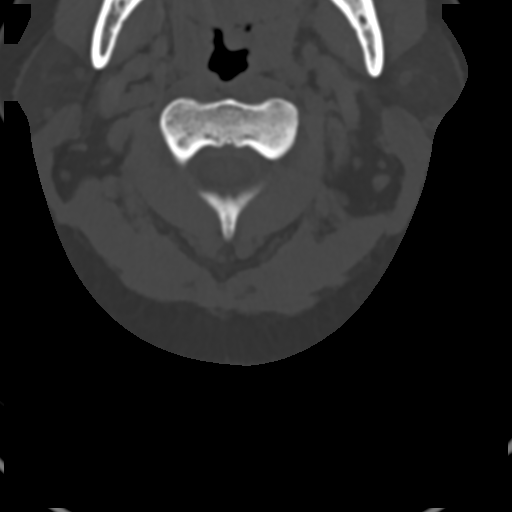
[im 66/86  bone]
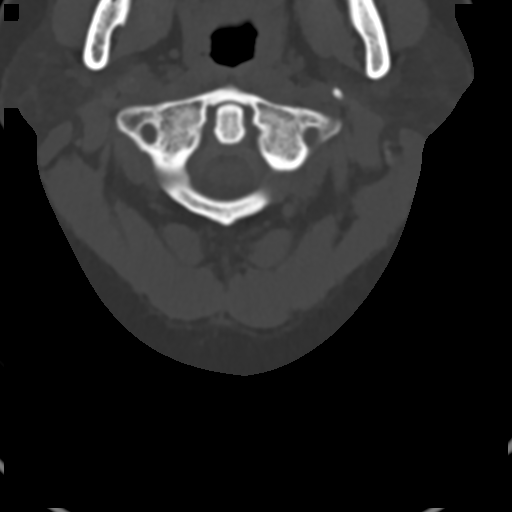
[im 79/86  bone]
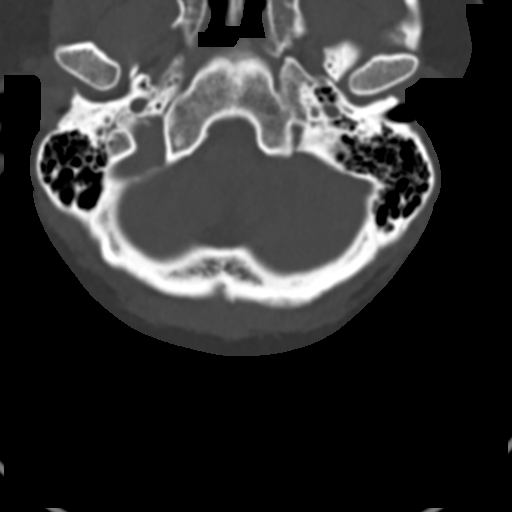

[Series 4: coronal bone · coronal · 0.38mm/px · 3 of 35 slices shown]
[im 7/35  bone]
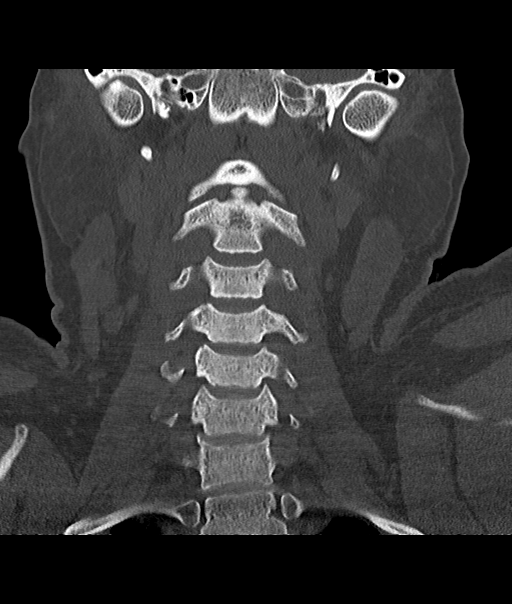
[im 14/35  bone]
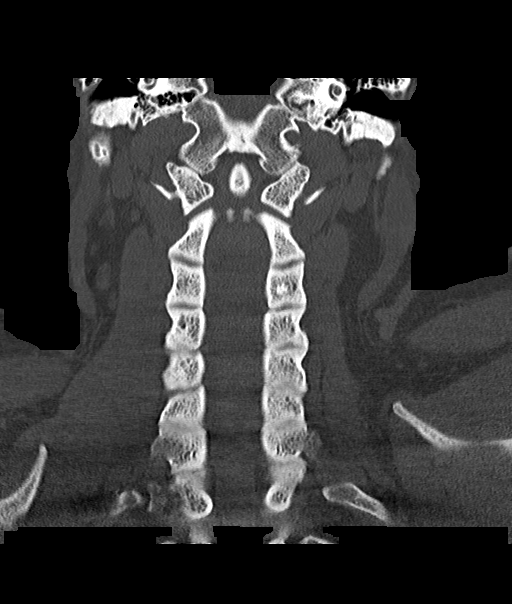
[im 21/35  bone]
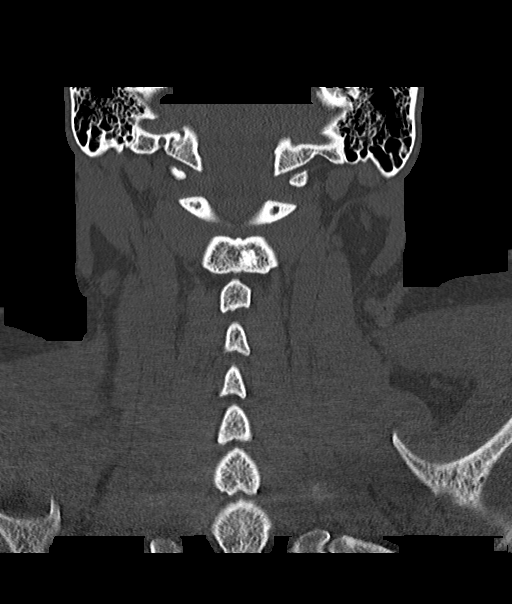

[Series 5: sagittal bone · sagittal · 0.38mm/px · 5 of 48 slices shown, 6 images]
[im 16/48  bone]
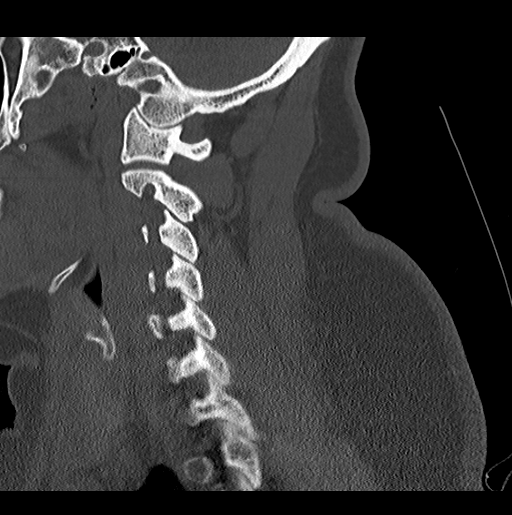
[im 20/48  bone]
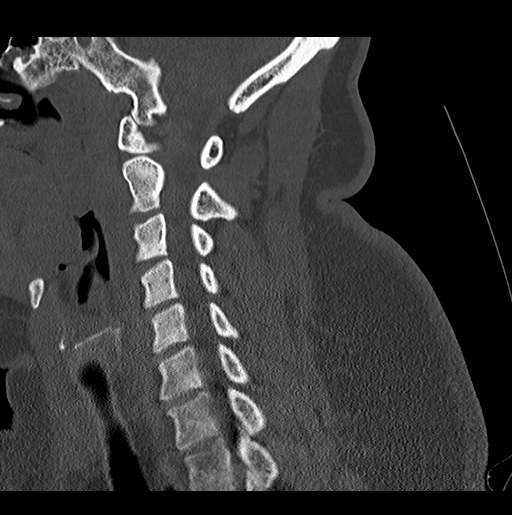
[im 24/48  soft-tissue]
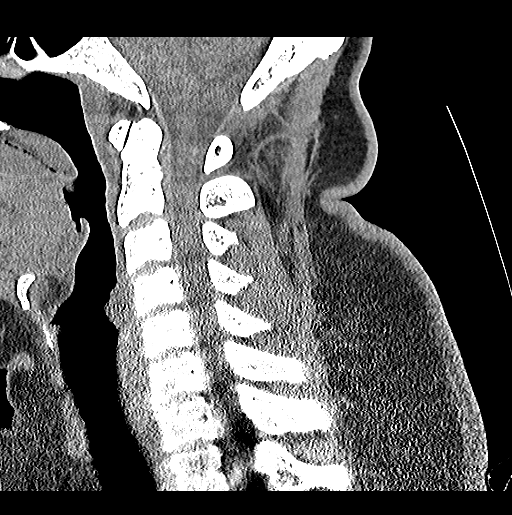
[im 24/48  bone]
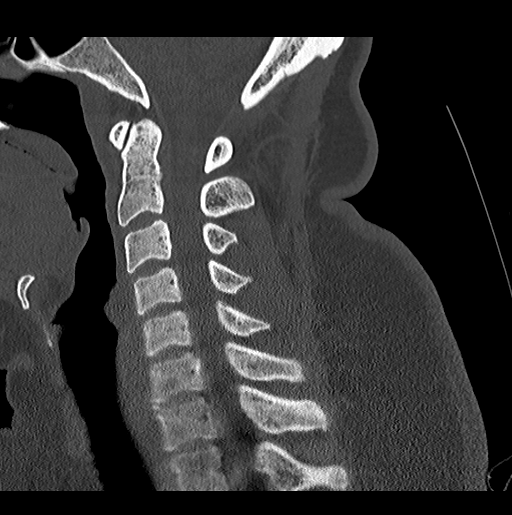
[im 28/48  bone]
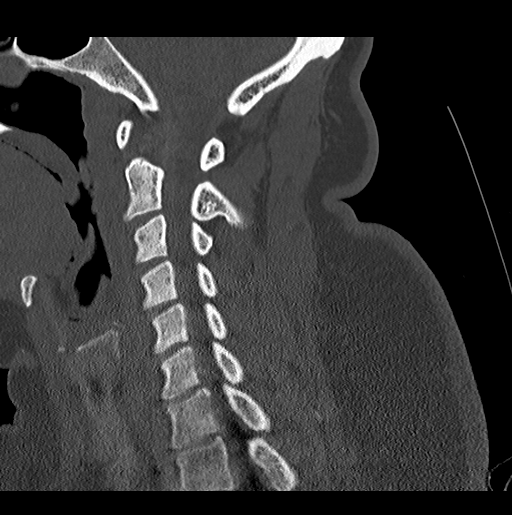
[im 32/48  bone]
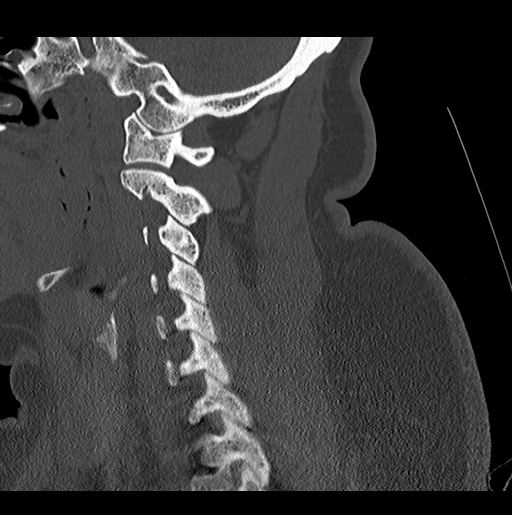

[16 of 33 positions shown; findings below may reference images not displayed]

FINDINGS: No fracture. No spondylolisthesis. There are minor disc degenerative
changes at C6-C7. No significant central spinal canal or neural
foraminal narrowing. No evidence of a disc herniation. Soft tissues
are unremarkable. Lung apices are clear.
IMPRESSION: 1. Minor disc degenerative changes at C6-C7.  No other abnormality.

## 2015-09-22 IMAGING — CR DG LUMBAR SPINE COMPLETE 4+V
5 series · 5 of 5 positions shown · non-contrast
Comparison: None.

CLINICAL DATA: Injury 08/21/2014.  Initial evaluation.

EXAM:
LUMBAR SPINE - COMPLETE 4+ VIEW

[view not recorded (1 of 5)]
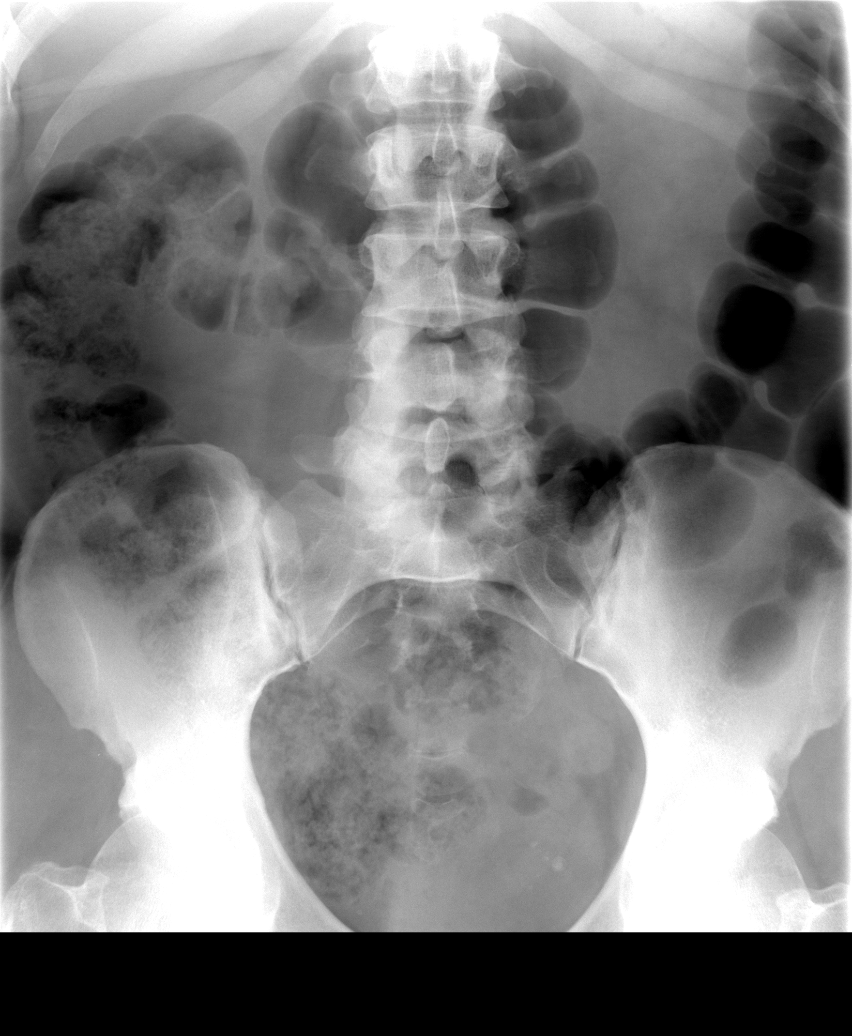

[view not recorded (2 of 5)]
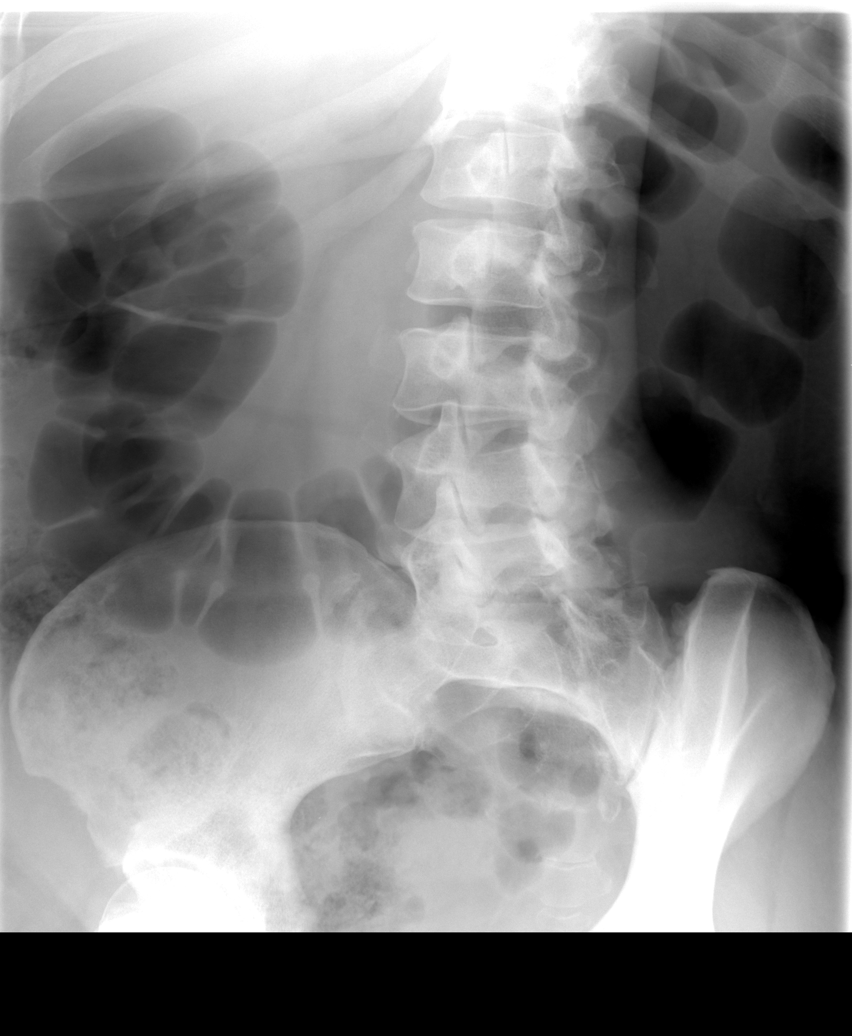

[view not recorded (3 of 5)]
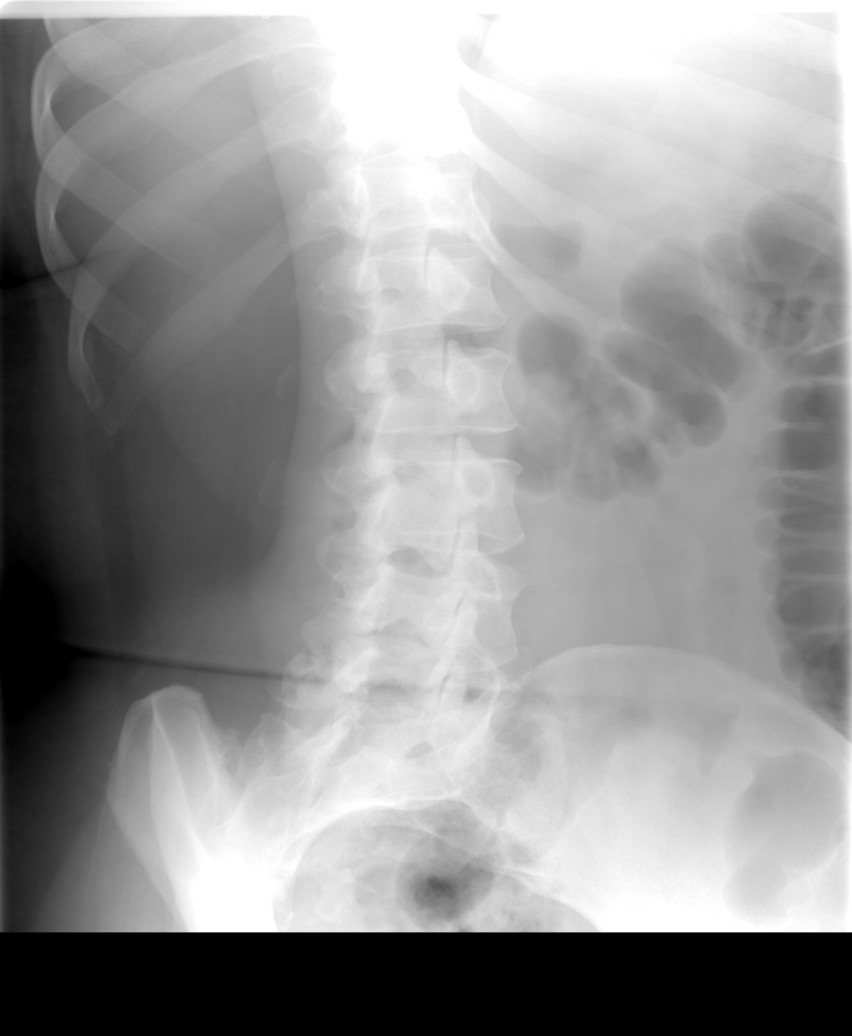

[view not recorded (4 of 5)]
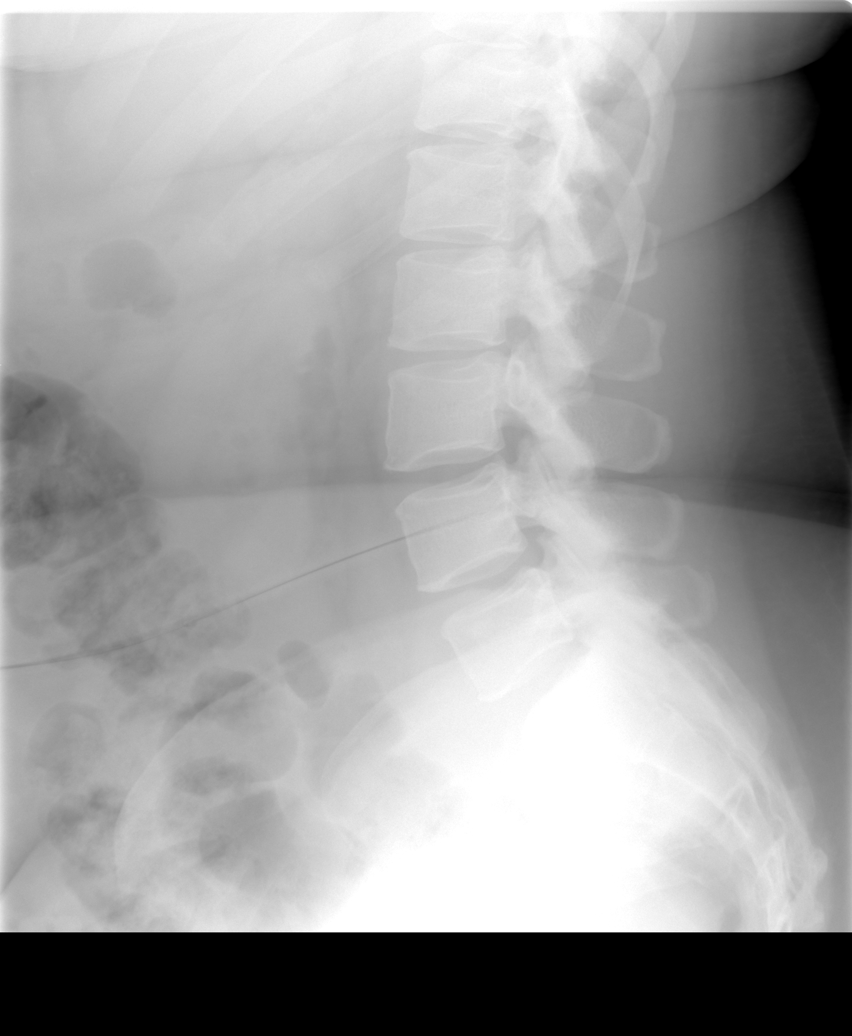

[view not recorded (5 of 5)]
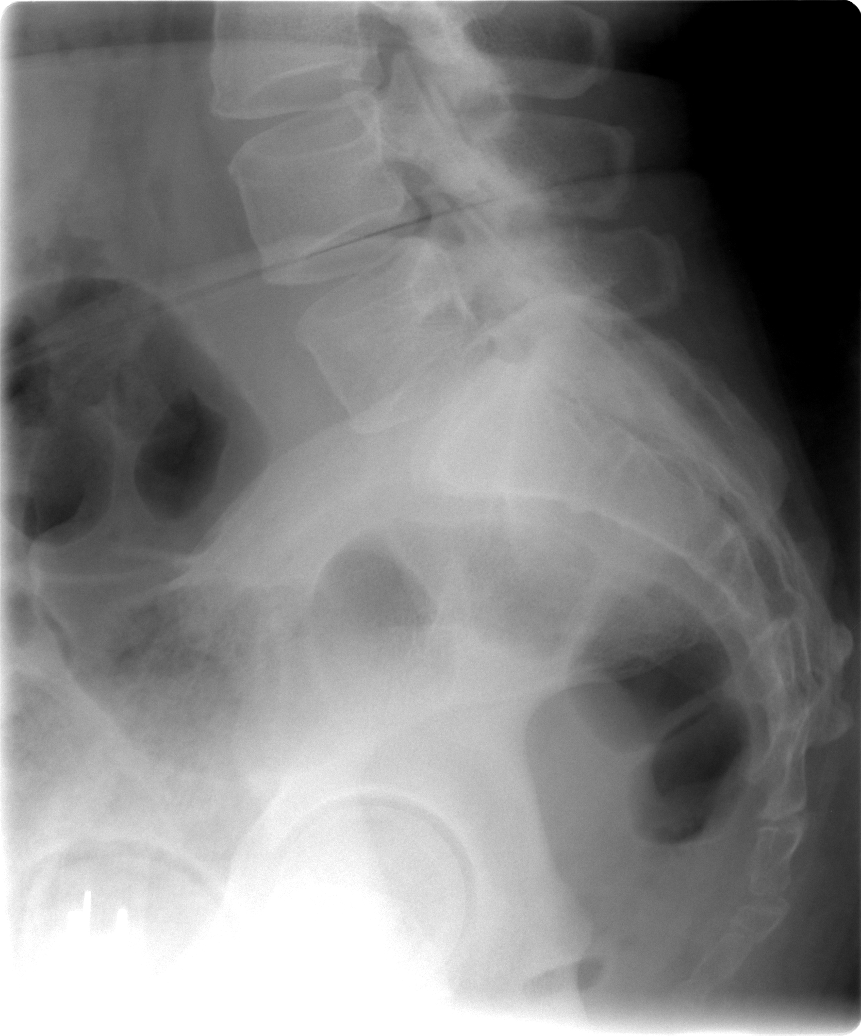

[5 of 5 positions shown; findings below may reference images not displayed]

FINDINGS: Paraspinal soft tissues are unremarkable. Pelvic calcifications
consistent phleboliths noted. Normal alignment and mineralization.
No acute bony abnormality.
IMPRESSION: No acute abnormality.

## 2015-12-25 ENCOUNTER — Encounter (HOSPITAL_COMMUNITY): Payer: Self-pay | Admitting: Emergency Medicine

## 2015-12-25 ENCOUNTER — Emergency Department (HOSPITAL_COMMUNITY): Payer: BC Managed Care – PPO

## 2015-12-25 DIAGNOSIS — E1165 Type 2 diabetes mellitus with hyperglycemia: Secondary | ICD-10-CM | POA: Diagnosis present

## 2015-12-25 DIAGNOSIS — C770 Secondary and unspecified malignant neoplasm of lymph nodes of head, face and neck: Secondary | ICD-10-CM | POA: Diagnosis present

## 2015-12-25 DIAGNOSIS — R000 Tachycardia, unspecified: Secondary | ICD-10-CM | POA: Diagnosis present

## 2015-12-25 DIAGNOSIS — E7800 Pure hypercholesterolemia, unspecified: Secondary | ICD-10-CM | POA: Diagnosis present

## 2015-12-25 DIAGNOSIS — J45909 Unspecified asthma, uncomplicated: Secondary | ICD-10-CM | POA: Diagnosis present

## 2015-12-25 DIAGNOSIS — Z6841 Body Mass Index (BMI) 40.0 and over, adult: Secondary | ICD-10-CM

## 2015-12-25 DIAGNOSIS — M79674 Pain in right toe(s): Secondary | ICD-10-CM | POA: Diagnosis present

## 2015-12-25 DIAGNOSIS — Z794 Long term (current) use of insulin: Secondary | ICD-10-CM

## 2015-12-25 DIAGNOSIS — I10 Essential (primary) hypertension: Secondary | ICD-10-CM | POA: Diagnosis present

## 2015-12-25 DIAGNOSIS — C3401 Malignant neoplasm of right main bronchus: Secondary | ICD-10-CM | POA: Diagnosis not present

## 2015-12-25 DIAGNOSIS — G4730 Sleep apnea, unspecified: Secondary | ICD-10-CM | POA: Diagnosis present

## 2015-12-25 DIAGNOSIS — R0602 Shortness of breath: Secondary | ICD-10-CM | POA: Diagnosis not present

## 2015-12-25 LAB — I-STAT TROPONIN, ED: Troponin i, poc: 0 ng/mL (ref 0.00–0.08)

## 2015-12-25 LAB — BASIC METABOLIC PANEL
Anion gap: 9 (ref 5–15)
BUN: 7 mg/dL (ref 6–20)
CHLORIDE: 99 mmol/L — AB (ref 101–111)
CO2: 26 mmol/L (ref 22–32)
CREATININE: 0.71 mg/dL (ref 0.44–1.00)
Calcium: 9.2 mg/dL (ref 8.9–10.3)
GFR calc Af Amer: >60 mL/min (ref >60)
GFR calc non Af Amer: >60 mL/min (ref >60)
GLUCOSE: 352 mg/dL — AB (ref 65–99)
Potassium: 3.7 mmol/L (ref 3.5–5.1)
SODIUM: 134 mmol/L — AB (ref 135–145)

## 2015-12-25 LAB — CBC
HEMATOCRIT: 33.7 % — AB (ref 36.0–46.0)
Hemoglobin: 10.3 g/dL — ABNORMAL LOW (ref 12.0–15.0)
MCH: 25.7 pg — ABNORMAL LOW (ref 26.0–34.0)
MCHC: 30.6 g/dL (ref 30.0–36.0)
MCV: 84 fL (ref 78.0–100.0)
PLATELETS: 377 10*3/uL (ref 150–400)
RBC: 4.01 MIL/uL (ref 3.87–5.11)
RDW: 13.1 % (ref 11.5–15.5)
WBC: 8.9 10*3/uL (ref 4.0–10.5)

## 2015-12-25 NOTE — ED Triage Notes (Signed)
Pt reports she was at work and began having CP and SOB. Pt sts "its always there but today it got worse at about 930 tonight." Pt a/o x 4, resp e/u.

## 2015-12-26 ENCOUNTER — Encounter (HOSPITAL_COMMUNITY): Payer: Self-pay | Admitting: Pulmonary Disease

## 2015-12-26 ENCOUNTER — Observation Stay (HOSPITAL_COMMUNITY): Payer: BC Managed Care – PPO

## 2015-12-26 ENCOUNTER — Inpatient Hospital Stay (HOSPITAL_COMMUNITY)
Admission: EM | Admit: 2015-12-26 | Discharge: 2015-12-30 | DRG: 988 | Disposition: A | Discharge status: Home / Self Care | Payer: BC Managed Care – PPO | Attending: Internal Medicine | Admitting: Internal Medicine

## 2015-12-26 ENCOUNTER — Emergency Department (HOSPITAL_COMMUNITY): Payer: BC Managed Care – PPO

## 2015-12-26 DIAGNOSIS — K839 Disease of biliary tract, unspecified: Secondary | ICD-10-CM

## 2015-12-26 DIAGNOSIS — E785 Hyperlipidemia, unspecified: Secondary | ICD-10-CM

## 2015-12-26 DIAGNOSIS — C3491 Malignant neoplasm of unspecified part of right bronchus or lung: Secondary | ICD-10-CM

## 2015-12-26 DIAGNOSIS — R918 Other nonspecific abnormal finding of lung field: Secondary | ICD-10-CM | POA: Diagnosis not present

## 2015-12-26 DIAGNOSIS — R51 Headache: Secondary | ICD-10-CM

## 2015-12-26 DIAGNOSIS — J9859 Other diseases of mediastinum, not elsewhere classified: Secondary | ICD-10-CM

## 2015-12-26 DIAGNOSIS — G893 Neoplasm related pain (acute) (chronic): Secondary | ICD-10-CM | POA: Diagnosis not present

## 2015-12-26 DIAGNOSIS — R739 Hyperglycemia, unspecified: Secondary | ICD-10-CM

## 2015-12-26 DIAGNOSIS — R079 Chest pain, unspecified: Secondary | ICD-10-CM

## 2015-12-26 DIAGNOSIS — E119 Type 2 diabetes mellitus without complications: Secondary | ICD-10-CM

## 2015-12-26 DIAGNOSIS — R519 Headache, unspecified: Secondary | ICD-10-CM

## 2015-12-26 DIAGNOSIS — M79676 Pain in unspecified toe(s): Secondary | ICD-10-CM

## 2015-12-26 DIAGNOSIS — R599 Enlarged lymph nodes, unspecified: Secondary | ICD-10-CM | POA: Diagnosis not present

## 2015-12-26 DIAGNOSIS — J45909 Unspecified asthma, uncomplicated: Secondary | ICD-10-CM | POA: Diagnosis not present

## 2015-12-26 DIAGNOSIS — I10 Essential (primary) hypertension: Secondary | ICD-10-CM

## 2015-12-26 DIAGNOSIS — R221 Localized swelling, mass and lump, neck: Secondary | ICD-10-CM | POA: Diagnosis not present

## 2015-12-26 DIAGNOSIS — R000 Tachycardia, unspecified: Secondary | ICD-10-CM

## 2015-12-26 DIAGNOSIS — Z6841 Body Mass Index (BMI) 40.0 and over, adult: Secondary | ICD-10-CM

## 2015-12-26 DIAGNOSIS — C801 Malignant (primary) neoplasm, unspecified: Secondary | ICD-10-CM

## 2015-12-26 LAB — GLUCOSE, CAPILLARY
GLUCOSE-CAPILLARY: 253 mg/dL — AB (ref 65–99)
GLUCOSE-CAPILLARY: 278 mg/dL — AB (ref 65–99)
GLUCOSE-CAPILLARY: 254 mg/dL — AB (ref 65–99)
Glucose-Capillary: 317 mg/dL — ABNORMAL HIGH (ref 65–99)

## 2015-12-26 LAB — LACTATE DEHYDROGENASE: LDH: 187 U/L (ref 98–192)

## 2015-12-26 LAB — CBG MONITORING, ED: GLUCOSE-CAPILLARY: 331 mg/dL — AB (ref 65–99)

## 2015-12-26 LAB — TROPONIN I
TROPONIN I: 0.04 ng/mL — AB (ref <0.03)
TROPONIN I: 0.03 ng/mL — AB (ref <0.03)
Troponin I: <0.03 ng/mL (ref <0.03)

## 2015-12-26 LAB — BRAIN NATRIURETIC PEPTIDE: B Natriuretic Peptide: 20.8 pg/mL (ref 0.0–100.0)

## 2015-12-26 MED ORDER — GADOBENATE DIMEGLUMINE 529 MG/ML IV SOLN
20.0000 mL | Freq: Once | INTRAVENOUS | Status: AC
  Administered 2015-12-26: 20 mL via INTRAVENOUS

## 2015-12-26 MED ORDER — SODIUM CHLORIDE 0.9 % IV SOLN
Freq: Once | INTRAVENOUS | Status: AC
  Administered 2015-12-26: 13:00:00 via INTRAVENOUS

## 2015-12-26 MED ORDER — SODIUM CHLORIDE 0.9% FLUSH
3.0000 mL | Freq: Two times a day (BID) | INTRAVENOUS | Status: DC
  Administered 2015-12-26 – 2015-12-30 (×7): 3 mL via INTRAVENOUS

## 2015-12-26 MED ORDER — DEXTROSE-NACL 5-0.45 % IV SOLN: INTRAVENOUS | Status: DC

## 2015-12-26 MED ORDER — SODIUM CHLORIDE 0.9 % IV SOLN
INTRAVENOUS | Status: DC
  Administered 2015-12-26: 12:00:00 via INTRAVENOUS

## 2015-12-26 MED ORDER — SODIUM CHLORIDE 0.9 % IV BOLUS (SEPSIS)
500.0000 mL | Freq: Once | INTRAVENOUS | Status: AC
  Administered 2015-12-26: 500 mL via INTRAVENOUS

## 2015-12-26 MED ORDER — ACETAMINOPHEN 650 MG RE SUPP: 650.0000 mg | Freq: Four times a day (QID) | RECTAL | Status: DC | PRN

## 2015-12-26 MED ORDER — INSULIN GLARGINE 100 UNIT/ML ~~LOC~~ SOLN
15.0000 [IU] | Freq: Every day | SUBCUTANEOUS | Status: DC
  Administered 2015-12-26 – 2015-12-29 (×4): 15 [IU] via SUBCUTANEOUS
  Filled 2015-12-26 (×4): qty 0.15

## 2015-12-26 MED ORDER — IOPAMIDOL (ISOVUE-370) INJECTION 76%
INTRAVENOUS | Status: AC
Start: 2015-12-26 — End: 2015-12-26
  Administered 2015-12-26: 100 mL
  Filled 2015-12-26: qty 100

## 2015-12-26 MED ORDER — ONDANSETRON HCL 4 MG/2ML IJ SOLN: 4.0000 mg | Freq: Three times a day (TID) | INTRAMUSCULAR | Status: AC | PRN

## 2015-12-26 MED ORDER — ALBUTEROL SULFATE (2.5 MG/3ML) 0.083% IN NEBU: 2.5000 mg | INHALATION_SOLUTION | RESPIRATORY_TRACT | Status: DC | PRN

## 2015-12-26 MED ORDER — INSULIN ASPART 100 UNIT/ML ~~LOC~~ SOLN
0.0000 [IU] | SUBCUTANEOUS | Status: DC
  Administered 2015-12-26 (×2): 5 [IU] via SUBCUTANEOUS
  Administered 2015-12-26: 7 [IU] via SUBCUTANEOUS
  Administered 2015-12-26: 5 [IU] via SUBCUTANEOUS
  Administered 2015-12-27 (×2): 3 [IU] via SUBCUTANEOUS
  Administered 2015-12-27: 2 [IU] via SUBCUTANEOUS
  Administered 2015-12-27: 5 [IU] via SUBCUTANEOUS

## 2015-12-26 MED ORDER — ACETAMINOPHEN 325 MG PO TABS
650.0000 mg | ORAL_TABLET | Freq: Four times a day (QID) | ORAL | Status: DC | PRN
  Administered 2015-12-26 – 2015-12-30 (×4): 650 mg via ORAL
  Filled 2015-12-26 (×4): qty 2

## 2015-12-26 MED ORDER — INSULIN ASPART 100 UNIT/ML ~~LOC~~ SOLN
5.0000 [IU] | Freq: Once | SUBCUTANEOUS | Status: AC
  Administered 2015-12-26: 5 [IU] via SUBCUTANEOUS
  Filled 2015-12-26: qty 1

## 2015-12-26 MED ORDER — SODIUM CHLORIDE 0.9 % IV BOLUS (SEPSIS)
1000.0000 mL | Freq: Once | INTRAVENOUS | Status: AC
  Administered 2015-12-26: 500 mL via INTRAVENOUS

## 2015-12-26 MED ORDER — MORPHINE SULFATE (PF) 4 MG/ML IV SOLN
4.0000 mg | Freq: Once | INTRAVENOUS | Status: AC
  Administered 2015-12-26: 4 mg via INTRAVENOUS
  Filled 2015-12-26: qty 1

## 2015-12-26 NOTE — Progress Notes (Signed)
Inpatient Diabetes Program Recommendations  AACE/ADA: New Consensus Statement on Inpatient Glycemic Control (2015)  Target Ranges:  Prepandial:   less than 140 mg/dL      Peak postprandial:   less than 180 mg/dL (1-2 hours)      Critically ill patients:  140 - 180 mg/dL   Lab Results  Component Value Date   GLUCAP 253 (H) 12/26/2015    Review of Glycemic Control  Diabetes history: DM 2 Outpatient Diabetes medications: Tresiba 100 units Daily, Victoza 1.8 Daily Current orders for Inpatient glycemic control: Novolog Sensitive Q4hrs while NPO  Inpatient Diabetes Program Recommendations:   Glucose in the 200-300 range. If glucose remains elevated after Correction scale initiation, please consider ordering a portion of basal inuslin (takes 100 units at home), Lantus 15 units Daily (0.1 units/kg, 135.2 kg).  Thanks,  Tama Headings RN, MSN, [facility] Inpatient Diabetes Coordinator Team Pager [phone] (8a-5p)

## 2015-12-26 NOTE — H&P (Signed)
TRH H&P   Patient Demographics:    Marilyn [name], is a 50 y.o. female  MRN: [id number]   DOB - 1966/03/17  Admit Date - 12/26/2015  Outpatient Primary MD for the patient is Dorrene German, MD  Referring MD/NP/PA: carry over  Patient coming from: Home  Chief Complaint  Patient presents with  . Chest Pain  . Shortness of Breath      HPI:    Marilyn [name]  is a 50 y.o. female, Asked medical history of Asthma, insulin-dependent diabetes, high cholesterol, hypertension, obesity , presents to ED with complaints of chest pain, right-sided, intermittent, progressive over last few month, accompanied by shortness of breath, she denies any weight loss, any cough, any smoking history, any secondhand smoking, CT chest with contrast significant for large paratracheal mass, surrounding trachea and right bronchus, patient tachycardic on admission, hospitalist was called to admit. In ED workup significant for tachycardia, no hypoxia, no significant labs abnormalities, CT chest with contrast with no evidence of PE, but significant for large right paratracheal mass encasing right side of trachea and right mainstem bronchus.    Review of systems:    In addition to the HPI above,  No Fever-chills, No Headache, No changes with Vision or hearing, No problems swallowing food or Liquids, No Chest pain, Cough or Shortness of Breath, No Abdominal pain, No Nausea or Vommitting, Bowel movements are regular, No Blood in stool or Urine, No dysuria, No new skin rashes or bruises, No new joints pains-aches,  No new weakness, tingling, numbness in any extremity, No recent weight gain or loss, No polyuria, polydypsia or polyphagia, No significant Mental Stressors.  A full 10 point Review of Systems was done, except as stated above, all other Review of Systems were negative.   With Past History of the  following :    Past Medical History:  Diagnosis Date  . Asthma   . Diabetes mellitus   . Hypercholesteremia   . Hypertension   . Obese       Past Surgical History:  Procedure Laterality Date  . CESAREAN SECTION    . TUBAL LIGATION        Social History:     Social History  Substance Use Topics  . Smoking status: Never Smoker  . Smokeless tobacco: Never Used  . Alcohol use No     Lives - at home  Mobility - independent     Family History :   Denies family history if lung cancer.   Home Medications:   Prior to Admission medications   Medication Sig Start Date End Date Taking? Authorizing Provider  Insulin Degludec (TRESIBA FLEXTOUCH) 200 UNIT/ML SOPN Inject 100 Units into the skin daily before breakfast.    Yes Historical Provider, MD  Liraglutide (VICTOZA) 18 MG/3ML SOPN Inject 1.8 mg into the skin daily.   Yes Historical Provider, MD  Allergies:    No Known Allergies   Physical Exam:   Vitals  Blood pressure 139/86, pulse (!) 118, temperature 97.7 F (36.5 C), temperature source Oral, resp. rate 18, height 5\' 9"  (1.753 m), weight 135.2 kg (298 lb), last menstrual period 11/23/2015, SpO2 99 %.   1. General well nourished lying in bed in NAD,    2. Normal affect and insight, Not Suicidal or Homicidal, Awake Alert, Oriented X 3.  3. No F.N deficits, ALL C.Nerves Intact, Strength 5/5 all 4 extremities, Sensation intact all 4 extremities, Plantars down going.  4. Ears and Eyes appear Normal, Conjunctivae clear, PERRLA. Moist Oral Mucosa.  5. Supple Neck, No JVD, No cervical lymphadenopathy appriciated, No Carotid Bruits.  6. Symmetrical Chest wall movement, Good air movement bilaterally, CTAB.  7. RRR, No Gallops, Rubs or Murmurs, No Parasternal Heave.  8. Positive Bowel Sounds, Abdomen Soft, No tenderness, No organomegaly appriciated,No rebound -guarding or rigidity.  9.  No Cyanosis, Normal Skin Turgor, No Skin Rash or Bruise.  10. Good  muscle tone,  joints appear normal , no effusions, Normal ROM.      Data Review:    CBC  Recent Labs Lab 12/25/15 2016  WBC 8.9  HGB 10.3*  HCT 33.7*  PLT 377  MCV 84.0  MCH 25.7*  MCHC 30.6  RDW 13.1   ------------------------------------------------------------------------------------------------------------------  Chemistries   Recent Labs Lab 12/25/15 2016  NA 134*  K 3.7  CL 99*  CO2 26  GLUCOSE 352*  BUN 7  CREATININE 0.71  CALCIUM 9.2   ------------------------------------------------------------------------------------------------------------------ estimated creatinine clearance is 127.3 mL/min (by C-G formula based on SCr of 0.71 mg/dL). ------------------------------------------------------------------------------------------------------------------ No results for input(s): TSH, T4TOTAL, T3FREE, THYROIDAB in the last 72 hours.  Invalid input(s): FREET3  Coagulation profile No results for input(s): INR, PROTIME in the last 168 hours. ------------------------------------------------------------------------------------------------------------------- No results for input(s): DDIMER in the last 72 hours. -------------------------------------------------------------------------------------------------------------------  Cardiac Enzymes No results for input(s): CKMB, TROPONINI, MYOGLOBIN in the last 168 hours.  Invalid input(s): CK ------------------------------------------------------------------------------------------------------------------    Component Value Date/Time   BNP 20.8 12/25/2015 2016     ---------------------------------------------------------------------------------------------------------------  Urinalysis    Component Value Date/Time   COLORURINE YELLOW 05/23/2015 2240   APPEARANCEUR CLEAR 05/23/2015 2240   LABSPEC 1.033 (H) 05/23/2015 2240   PHURINE 6.0 05/23/2015 2240   GLUCOSEU 500 (A) 05/23/2015 2240   HGBUR NEGATIVE  05/23/2015 2240   BILIRUBINUR NEGATIVE 05/23/2015 2240   KETONESUR NEGATIVE 05/23/2015 2240   PROTEINUR NEGATIVE 05/23/2015 2240   UROBILINOGEN 0.2 02/01/2013 2022   NITRITE NEGATIVE 05/23/2015 2240   LEUKOCYTESUR NEGATIVE 05/23/2015 2240    ----------------------------------------------------------------------------------------------------------------   Imaging Results:    Dg Chest 2 View  Result Date: 12/25/2015 CLINICAL DATA:  Generalize chronic chest pain, shortness of breath, and cough. History of asthma, diabetes, and hypertension. EXAM: CHEST  2 VIEW COMPARISON:  09/15/2015 FINDINGS: Right hilar and right peritracheal lymphadenopathy is demonstrated similar to prior study. Lungs are clear and expanded. No focal airspace disease or consolidation. No blunting of costophrenic angles. No pneumothorax. Normal heart size and pulmonary vascularity. Mild degenerative changes in the spine. IMPRESSION: Right hilar and right paratracheal lymphadenopathy is demonstrated, similar to prior study. No evidence of active pulmonary disease. Electronically Signed   By: Burman Nieves M.D.   On: 12/25/2015 21:23   Ct Angio Chest Pe W Or Wo Contrast  Result Date: 12/26/2015 CLINICAL DATA:  Acute onset of generalized chest pain, tachycardia, high blood pressure and shortness of  breath. Initial encounter. EXAM: CT ANGIOGRAPHY CHEST WITH CONTRAST TECHNIQUE: Multidetector CT imaging of the chest was performed using the standard protocol during bolus administration of intravenous contrast. Multiplanar CT image reconstructions and MIPs were obtained to evaluate the vascular anatomy. CONTRAST:  100 mL of Isovue 370 IV contrast COMPARISON:  Chest radiograph performed 12/25/2015, and CTA of the chest performed 05/30/2015 FINDINGS: Cardiovascular: There is no evidence of pulmonary embolus. There is mild mass effect on vasculature from the patient's mediastinal and right hilar masses. The thoracic aorta remains grossly  intact. The great vessels are grossly unremarkable in appearance. Mediastinum/Nodes: There is a large 7.2 x 6.0 x 4.8 cm mass at the right paratracheal region, markedly enlarged from the prior study, partially encasing the right side of the trachea and the right mainstem bronchus, highly suspicious for small cell lung cancer. There is mild mass effect on the right side of the trachea. Right hilar and azygoesophageal recess nodes measure up to 2.0 cm in short axis, concerning for metastatic disease. No pericardial effusion is identified. Scattered prominent anterior mediastinal nodes are seen, measuring up to 1.7 cm in short axis. Lungs/Pleura: Bibasilar atelectasis is noted. No pleural effusion or pneumothorax is seen. No pulmonary nodules are identified. Upper Abdomen: The visualized portions of the liver and spleen are grossly unremarkable. Musculoskeletal: No acute osseous abnormalities are identified. The visualized musculature is unremarkable in appearance. Review of the MIP images confirms the above findings. IMPRESSION: 1. No evidence of pulmonary embolus. 2. 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 Mild mass-effect on the right side of the trachea. 3. Right hilar and azygoesophageal recess nodes measure up to 2.0 cm in short axis. Prominent anterior mediastinal nodes measure up to 1.7 cm in short axis. These are concerning for metastatic disease. 4. Bibasilar atelectasis noted. These results were called by telephone at the time of interpretation on 12/26/2015 at 5:37 am to [facility] PA, who verbally acknowledged these results. Electronically Signed   By: Roanna Raider M.D.   On: 12/26/2015 05:40    My personal review of EKG: Rhythm NSR, Rate  139 /min, QTc 423 , no Acute ST changes   Assessment & Plan:     Active Problems:   Chest pain   Lung mass   Headache   Diabetes mellitus (HCC)    Lung mass - This is highly suspicious of malignancy, will be seen by oncology Dr. Candise Che, as well PCCM consulted for need of bronchoscopy and tissue sample, at this point there is no indication for steroid treatment prior  PCCM. - Obtain CT abdomen pelvis with contrast tomorrow (given she received IV contrast with CT chest today ) to rule out any metastasis . - We'll obtain MRI brain to rule out brain metastasis given she complains of headache .  Diabetes mellitus - Hold oral hypoglycemic agent, will keep on insulin sliding scale every 4 hours, can be changed to before meals when she is not nothing by mouth(currently she is nothing by mouth for possible procedure today)  Headache - We'll obtain MRI brain to rule out metastasis  Chest pain - Secondary to lung mass, EKG nonacute, we'll cycle troponins    DVT Prophylaxis SCDs, no chemical DVT prophylaxis pending bronchoscopy   AM Labs Ordered, also please review Full Orders  Family Communication: Admission, patients  condition and plan of care including tests being ordered have been discussed with the patient and mother who indicate understanding and agree with the plan and Code Status.  Code Status  Full  Likely DC to  Home  Condition GUARDED    Consults called: oncology Dr Candise Che, PCCM Dr Jamison Neighbor  Admission status: observation  Time spent in minutes : 55 minutes   Detrell Umscheid M.D on 12/26/2015 at 8:46 AM  Between 7am to 7pm - Pager - [phone]. After 7pm go to [email or web address] - password [facility]  [facility] - Office  [phone]

## 2015-12-26 NOTE — ED Provider Notes (Signed)
[facility] DEPT Provider Note   CSN: [id number] Arrival date & time: 12/25/15  1952     History   Chief Complaint Chief Complaint  Patient presents with  . Chest Pain  . Shortness of Breath    HPI Marilyn [name] is a 50 y.o. female with a hx of Asthma, insulin-dependent diabetes, high cholesterol, hypertension, obesity presents to the Emergency Department complaining of gradual, persistent, progressively worsening central and right sided chest pain with associated shortness of breath onset Acutely worsening at 9:30 PM.  Patient reports she has had associated cough and some weight loss. She reports persistent chest discomfort for several weeks. Denies night sweats. She endorses bilateral peripheral edema but states this is baseline for her. Patient reports she is not a smoker. No sick contacts. No fevers, chills, rhinorrhea, sore throat or otalgia.  Patient also reports a history of insulin-dependent diabetes. She has been out of her insulin for approximately one week.  The history is provided by the patient and medical records. No language interpreter was used.    Past Medical History:  Diagnosis Date  . Asthma   . Diabetes mellitus   . Hypercholesteremia   . Hypertension   . Obese     Patient Active Problem List   Diagnosis Date Noted  . Chest pain 12/26/2015    Past Surgical History:  Procedure Laterality Date  . CESAREAN SECTION    . TUBAL LIGATION      OB History    No data available       Home Medications    Prior to Admission medications   Medication Sig Start Date End Date Taking? Authorizing Provider  Insulin Degludec (TRESIBA FLEXTOUCH) 200 UNIT/ML SOPN Inject 100 Units into the skin daily before breakfast.    Yes Historical Provider, MD  Liraglutide (VICTOZA) 18 MG/3ML SOPN Inject 1.8 mg into the skin daily.   Yes Historical Provider, MD    Family History History reviewed. No pertinent family history.  Social History Social History  Substance  Use Topics  . Smoking status: Never Smoker  . Smokeless tobacco: Never Used  . Alcohol use No     Allergies   Review of patient's allergies indicates no known allergies.   Review of Systems Review of Systems  Respiratory: Positive for cough and shortness of breath.   Cardiovascular: Positive for chest pain.  All other systems reviewed and are negative.    Physical Exam Updated Vital Signs BP 142/85   Pulse (!) 124   Temp 98.5 F (36.9 C) (Oral)   Resp 25   Ht '5\' 9"'$  (1.753 m)   Wt 135.2 kg   LMP 11/23/2015 (Approximate)   SpO2 100%   BMI 44.01 kg/m   Physical Exam  Constitutional: She appears well-developed and well-nourished. No distress.  Awake, alert, nontoxic appearance  HENT:  Head: Normocephalic and atraumatic.  Mouth/Throat: Oropharynx is clear and moist. No oropharyngeal exudate.  Eyes: Conjunctivae are normal. No scleral icterus.  Neck: Normal range of motion. Neck supple.  Cardiovascular: Regular rhythm and intact distal pulses.  Tachycardia present.   Pulses:      Radial pulses are 2+ on the right side, and 2+ on the left side.  Pulmonary/Chest: Effort normal and breath sounds normal. No respiratory distress. She has no wheezes.  Equal chest expansion  Abdominal: Soft. Bowel sounds are normal. She exhibits no mass. There is no tenderness. There is no rebound and no guarding.  Musculoskeletal: Normal range of motion. She exhibits edema (  Trace, nonpitting).  Neurological: She is alert.  Speech is clear and goal oriented Moves extremities without ataxia  Skin: Skin is warm and dry. She is not diaphoretic.  Psychiatric: She has a normal mood and affect.  Nursing note and vitals reviewed.    ED Treatments / Results  Labs (all labs ordered are listed, but only abnormal results are displayed) Labs Reviewed  BASIC METABOLIC PANEL - Abnormal; Notable for the following:       Result Value   Sodium 134 (*)    Chloride 99 (*)    Glucose, Bld 352 (*)     All other components within normal limits  CBC - Abnormal; Notable for the following:    Hemoglobin 10.3 (*)    HCT 33.7 (*)    MCH 25.7 (*)    All other components within normal limits  CBG MONITORING, ED - Abnormal; Notable for the following:    Glucose-Capillary 331 (*)    All other components within normal limits  BRAIN NATRIURETIC PEPTIDE  I-STAT TROPOININ, ED    EKG  EKG Interpretation  Date/Time:  Thursday December 25 2015 20:00:02 EDT Ventricular Rate:  139 PR Interval:  134 QRS Duration: 74 QT Interval:  278 QTC Calculation: 423 R Axis:   38 Text Interpretation:  Sinus tachycardia Nonspecific T wave abnormality Abnormal ECG No significant change since last tracing Confirmed by WARD,  DO, KRISTEN [phone]) on 12/26/2015 6:10:20 AM       Radiology Dg Chest 2 View  Result Date: 12/25/2015 CLINICAL DATA:  Generalize chronic chest pain, shortness of breath, and cough. History of asthma, diabetes, and hypertension. EXAM: CHEST  2 VIEW COMPARISON:  09/15/2015 FINDINGS: Right hilar and right peritracheal lymphadenopathy is demonstrated similar to prior study. Lungs are clear and expanded. No focal airspace disease or consolidation. No blunting of costophrenic angles. No pneumothorax. Normal heart size and pulmonary vascularity. Mild degenerative changes in the spine. IMPRESSION: Right hilar and right paratracheal lymphadenopathy is demonstrated, similar to prior study. No evidence of active pulmonary disease. Electronically Signed   By: Lucienne Capers M.D.   On: 12/25/2015 21:23   Ct Angio Chest Pe W Or Wo Contrast  Result Date: 12/26/2015 CLINICAL DATA:  Acute onset of generalized chest pain, tachycardia, high blood pressure and shortness of breath. Initial encounter. EXAM: CT ANGIOGRAPHY CHEST WITH CONTRAST TECHNIQUE: Multidetector CT imaging of the chest was performed using the standard protocol during bolus administration of intravenous contrast. Multiplanar CT image  reconstructions and MIPs were obtained to evaluate the vascular anatomy. CONTRAST:  100 mL of Isovue 370 IV contrast COMPARISON:  Chest radiograph performed 12/25/2015, and CTA of the chest performed 05/30/2015 FINDINGS: Cardiovascular: There is no evidence of pulmonary embolus. There is mild mass effect on vasculature from the patient's mediastinal and right hilar masses. The thoracic aorta remains grossly intact. The great vessels are grossly unremarkable in appearance. Mediastinum/Nodes: There is a large 7.2 x 6.0 x 4.8 cm mass at the right paratracheal region, markedly enlarged from the prior study, partially encasing the right side of the trachea and the right mainstem bronchus, highly suspicious for small cell lung cancer. There is mild mass effect on the right side of the trachea. Right hilar and azygoesophageal recess nodes measure up to 2.0 cm in short axis, concerning for metastatic disease. No pericardial effusion is identified. Scattered prominent anterior mediastinal nodes are seen, measuring up to 1.7 cm in short axis. Lungs/Pleura: Bibasilar atelectasis is noted. No pleural effusion or pneumothorax is  seen. No pulmonary nodules are identified. Upper Abdomen: The visualized portions of the liver and spleen are grossly unremarkable. Musculoskeletal: No acute osseous abnormalities are identified. The visualized musculature is unremarkable in appearance. Review of the MIP images confirms the above findings. IMPRESSION: 1. No evidence of pulmonary embolus. 2. 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 Mild mass-effect on the right side of the trachea. 3. Right hilar and azygoesophageal recess nodes measure up to 2.0 cm in short axis. Prominent anterior mediastinal nodes measure up to 1.7 cm in short axis. These are  concerning for metastatic disease. 4. Bibasilar atelectasis noted. These results were called by telephone at the time of interpretation on 12/26/2015 at 5:37 am to [facility] PA, who verbally acknowledged these results. Electronically Signed   By: Garald Balding M.D.   On: 12/26/2015 05:40    Procedures Procedures (including critical care time)  Medications Ordered in ED Medications  sodium chloride 0.9 % bolus 500 mL (0 mLs Intravenous Stopped 12/26/15 0308)  sodium chloride 0.9 % bolus 1,000 mL (0 mLs Intravenous Stopped 12/26/15 0340)  insulin aspart (novoLOG) injection 5 Units (5 Units Subcutaneous Given 12/26/15 0230)  morphine 4 MG/ML injection 4 mg (4 mg Intravenous Given 12/26/15 0231)  iopamidol (ISOVUE-370) 76 % injection (100 mLs  Contrast Given 12/26/15 0347)     Initial Impression / Assessment and Plan / ED Course  I have reviewed the triage vital signs and the nursing notes.  Pertinent labs & imaging results that were available during my care of the patient were reviewed by me and considered in my medical decision making (see chart for details).  Clinical Course  Value Comment By Time  Troponin i, poc: 0.00 Negative Abigail Butts, PA-C 09/22 0156  Glucose: (!) 352 Elevated with normal anion gap Abigail Butts, PA-C 09/22 0156  Hemoglobin: (!) 10.3 Baseline [facility], PA-C 09/22 0156  WBC: 8.9 No leukocytosis Hillary Struss, PA-C 09/22 0156  BP: 142/85 Tachycardia. Hypertension. No hypoxia. Abigail Butts, PA-C 09/22 3154   Review shows multiple CT scans in the past for chest pain and tachycardia without evidence of dissection or PE however patient again presents today with tachycardia shortness of breath and chest pain. Jarrett Soho Miguel Medal, PA-C 09/22 0156  CT ANGIO CHEST PE W OR WO CONTRAST Large 7.2 x 6.0 x 4.8 cm mass at the right paratracheal region, markedly enlarged from the prior study, partially encasing the right side of the  trachea and right mainstem bronchus, highly suspicious for small cell lung cancer. Mild mass-effect on the right side of the trachea.   Abigail Butts, PA-C 09/22 [phone]   Discussed with Dr. Irene Limbo of oncology who reports that pt needs medical admission and prompt biopsy.  He will consult and evaluate the pt this morning.   Abigail Butts, PA-C 09/22 7619   The patient was discussed with and seen by Dr. Leonides Schanz who agrees with the treatment plan.  Abigail Butts, PA-C 09/22 [phone]    Patient with chest pain. Negative troponin. Elevated glucose but no elevation in anion gap. Patient given insulin and fluids. Persistent tachycardia is concerning for possible PE in spite of multiple CT scans in the past. CT scan today shows a large mass markedly enlarged from the prior study with a mass effect on the right side of the trachea.  Patient will need medical admission for biopsy.  Tachycardia has improved significantly since fluids. Patient's pain remains about the same. She has had no episodes of hypoxia here in the emergency department.  Final Clinical Impressions(s) / ED Diagnoses   Final diagnoses:  Chest pain, unspecified chest pain type  Malignant neoplasm of right lung, unspecified part of lung [facility])  Hyperglycemia  Tachycardia    New Prescriptions Current Discharge Medication List       Abigail Butts, PA-C 12/26/15 [place], DO 12/26/15 [phone]

## 2015-12-26 NOTE — Progress Notes (Signed)
[place] MD notified ot troponin level 0.04, wil continue to monitor

## 2015-12-26 NOTE — Consult Note (Signed)
[name]    HEMATOLOGY/ONCOLOGY CONSULTATION NOTE  Date of Service: 12/26/2015  Patient Care Team: Nolene Ebbs, MD as PCP - General (Internal Medicine)  CHIEF COMPLAINTS/PURPOSE OF CONSULTATION:  Rt large paratracheal mass and mediastinal Lymphadenopathy.  HISTORY OF PRESENTING ILLNESS:   Marilyn [name] is a 50 y.o. female who has been referred to [place] by Dr Albertine Patricia, MD and by ED physician for evaluation and management of newly noted right paratracheal mass and mediastinal lymphadenopathy.  Patient has history of hypertension, diabetes, dyslipidemia, morbid obesity, asthma who presents with symptoms of increasing chest pain shortness of breath and worsening cough over the last 3-4 months. Patient notes she was in the emergency room in February 2017 with some chest pain and had a CTA of the chest - she was noted to have no evidence of pulmonary embolism or aortic dissection. There was minimal infiltrative changes within the right hilar area and some mediastinal adenopathy. She was recommended repeat imaging in 3-6 months with her primary care physician. Patient notes that she never really followed up with her primary care physician for this.  Patient was seen in the emergency room early morning on 12/26/2015 with worsening chest pain shortness of breath and cough and had a CTA of the chest repeated. This showed no evidence of pulmonary embolism but she was noted to have a large 7.2 x 6 x 4.8 cm mass in the right paratracheal region which is markedly enlarged from the previous study and partially encases the right side of the trachea and the right mainstem bronchus with mild mass effect on the right side of the trachea. She is also noted to have right hilar and other mediastinal adenopathy. I recommended patient be admitted for an expedited workup given threatened airway and other high impact organ threatening disease -to speed up the diagnosis and optimize time to treatment. Patient was  admitted to the hospitalist service. I discussed the patient with Dr. Waldron Labs. Patient has had an MRI of the brain for evaluation of some headaches and to complete her staging. No evidence of brain metastases were noted.  Pulmonary has been consulted for possible EBUS and biopsy for tissue diagnosis. They want to wait until the patient has a CT of the abdomen to rule out other easily biopsable lesions.  Patient notes no abdominal pain or other abdominal focal symptoms. Pain  is currently well controlled with pain medications. Mother is at bedside during this interview .  Notes no significant weight loss. No fevers no chills no night sweats .  Patient reports that she has been a lifelong nonsmoker.   MEDICAL HISTORY:  Past Medical History:  Diagnosis Date  . Asthma   . Diabetes mellitus   . Hypercholesteremia   . Hypertension   . Obese   Morbid obesity .Body mass index is 44.01 kg/m.   SURGICAL HISTORY: Past Surgical History:  Procedure Laterality Date  . CESAREAN SECTION    . TUBAL LIGATION      SOCIAL HISTORY: Social History   Social History  . Marital status: Divorced    Spouse name: N/A  . Number of children: N/A  . Years of education: N/A   Occupational History  . Not on file.   Social History Main Topics  . Smoking status: Never Smoker  . Smokeless tobacco: Never Used  . Alcohol use No  . Drug use: No  . Sexual activity: Not on file   Other Topics Concern  . Not on file   Social  History Narrative  . No narrative on file  Reports that she is a lifelong nonsmoker. No significant alcohol use. No drug use.  FAMILY HISTORY: Reports no family history of cancer or blood disorders.  ALLERGIES:  has No Known Allergies.  MEDICATIONS:  Current Facility-Administered Medications  Medication Dose Route Frequency Provider Last Rate Last Dose  . 0.9 %  sodium chloride infusion   Intravenous Continuous Albertine Patricia, MD 75 mL/hr at 12/26/15 1205    .  acetaminophen (TYLENOL) tablet 650 mg  650 mg Oral Q6H PRN Albertine Patricia, MD       Or  . acetaminophen (TYLENOL) suppository 650 mg  650 mg Rectal Q6H PRN Silver Huguenin Elgergawy, MD      . albuterol (PROVENTIL) (2.5 MG/3ML) 0.083% nebulizer solution 2.5 mg  2.5 mg Nebulization Q2H PRN Silver Huguenin Elgergawy, MD      . dextrose 5 %-0.45 % sodium chloride infusion   Intravenous Continuous Magdalen Spatz, NP   Stopped at 12/26/15 1127  . insulin aspart (novoLOG) injection 0-9 Units  0-9 Units Subcutaneous Q4H Albertine Patricia, MD   7 Units at 12/26/15 1212  . insulin glargine (LANTUS) injection 15 Units  15 Units Subcutaneous Daily Albertine Patricia, MD   15 Units at 12/26/15 1440  . ondansetron (ZOFRAN) injection 4 mg  4 mg Intravenous Q8H PRN Hannah Muthersbaugh, PA-C      . sodium chloride flush (NS) 0.9 % injection 3 mL  3 mL Intravenous Q12H Albertine Patricia, MD   3 mL at 12/26/15 1100    REVIEW OF SYSTEMS:    10 Point review of Systems was done is negative except as noted above.  PHYSICAL EXAMINATION: ECOG PERFORMANCE STATUS: 1 - Symptomatic but completely ambulatory  . Vitals:   12/26/15 0808 12/26/15 1417  BP: 139/86 122/83  Pulse: (!) 118 (!) 124  Resp: 18 19  Temp: 97.7 F (36.5 C) 97.5 F (36.4 C)   Filed Weights   12/25/15 2020  Weight: 298 lb (135.2 kg)   .Body mass index is 44.01 kg/m.  GENERAL:alert, in no acute distress and comfortable SKIN: skin color, texture, turgor are normal, no rashes or significant lesions EYES: normal, conjunctiva are pink and non-injected, sclera clear OROPHARYNX:no exudate, no erythema and lips, buccal mucosa, and tongue normal  NECK: supple, no JVD, thyroid normal size, non-tender, without nodularity LYMPH:  no palpable lymphadenopathy in the cervical, axillary or inguinal. Her right paratracheal mass appears to be extending into the right supraclavicular space and is palpable clinically.               LUNGS: clear to auscultation  with normal respiratory effort. Distant breath sounds  HEART: regular rate & rhythm,  no murmurs and no lower extremity edema ABDOMEN: abdomen obese, soft, non-tender, normoactive bowel sounds  Musculoskeletal: no cyanosis of digits and no clubbing  PSYCH: alert & oriented x 3 with fluent speech NEURO: no focal motor/sensory deficits  LABORATORY DATA:  I have reviewed the data as listed  . CBC Latest Ref Rng & Units 12/27/2015 12/25/2015 09/15/2015  WBC 4.0 - 10.5 K/uL 7.6 8.9 8.2  Hemoglobin 12.0 - 15.0 g/dL 9.7(L) 10.3(L) 10.5(L)  Hematocrit 36.0 - 46.0 % 31.5(L) 33.7(L) 33.5(L)  Platelets 150 - 400 K/uL 337 377 372   . CBC    Component Value Date/Time   WBC 7.6 12/27/2015 0156   RBC 3.73 (L) 12/27/2015 0156   HGB 9.7 (L) 12/27/2015 8563  HCT 31.5 (L) 12/27/2015 0156   PLT 337 12/27/2015 0156   MCV 84.5 12/27/2015 0156   MCH 26.0 12/27/2015 0156   MCHC 30.8 12/27/2015 0156   RDW 13.1 12/27/2015 0156   LYMPHSABS 3.3 05/23/2015 2025   MONOABS 0.4 05/23/2015 2025   EOSABS 0.1 05/23/2015 2025   BASOSABS 0.0 05/23/2015 2025   . CMP Latest Ref Rng & Units 12/25/2015 09/15/2015 05/30/2015  Glucose 65 - 99 mg/dL 352(H) 216(H) 128(H)  BUN 6 - 20 mg/dL '7 7 8  '$ Creatinine 0.44 - 1.00 mg/dL 0.71 0.52 0.57  Sodium 135 - 145 mmol/L 134(L) 138 140  Potassium 3.5 - 5.1 mmol/L 3.7 3.7 3.9  Chloride 101 - 111 mmol/L 99(L) 103 102  CO2 22 - 32 mmol/L '26 27 27  '$ Calcium 8.9 - 10.3 mg/dL 9.2 9.3 9.4  Total Protein 6.5 - 8.1 g/dL - - -  Total Bilirubin 0.3 - 1.2 mg/dL - - -  Alkaline Phos 38 - 126 U/L - - -  AST 15 - 41 U/L - - -  ALT 14 - 54 U/L - - -   . Lab Results  Component Value Date   LDH 187 12/26/2015    RADIOGRAPHIC STUDIES: I have personally reviewed the radiological images as listed and agreed with the findings in the report. Dg Chest 2 View  Result Date: 12/25/2015 CLINICAL DATA:  Generalize chronic chest pain, shortness of breath, and cough. History of asthma,  diabetes, and hypertension. EXAM: CHEST  2 VIEW COMPARISON:  09/15/2015 FINDINGS: Right hilar and right peritracheal lymphadenopathy is demonstrated similar to prior study. Lungs are clear and expanded. No focal airspace disease or consolidation. No blunting of costophrenic angles. No pneumothorax. Normal heart size and pulmonary vascularity. Mild degenerative changes in the spine. IMPRESSION: Right hilar and right paratracheal lymphadenopathy is demonstrated, similar to prior study. No evidence of active pulmonary disease. Electronically Signed   By: Lucienne Capers M.D.   On: 12/25/2015 21:23   Ct Angio Chest Pe W Or Wo Contrast  Result Date: 12/26/2015 CLINICAL DATA:  Acute onset of generalized chest pain, tachycardia, high blood pressure and shortness of breath. Initial encounter. EXAM: CT ANGIOGRAPHY CHEST WITH CONTRAST TECHNIQUE: Multidetector CT imaging of the chest was performed using the standard protocol during bolus administration of intravenous contrast. Multiplanar CT image reconstructions and MIPs were obtained to evaluate the vascular anatomy. CONTRAST:  100 mL of Isovue 370 IV contrast COMPARISON:  Chest radiograph performed 12/25/2015, and CTA of the chest performed 05/30/2015 FINDINGS: Cardiovascular: There is no evidence of pulmonary embolus. There is mild mass effect on vasculature from the patient's mediastinal and right hilar masses. The thoracic aorta remains grossly intact. The great vessels are grossly unremarkable in appearance. Mediastinum/Nodes: There is a large 7.2 x 6.0 x 4.8 cm mass at the right paratracheal region, markedly enlarged from the prior study, partially encasing the right side of the trachea and the right mainstem bronchus, highly suspicious for small cell lung cancer. There is mild mass effect on the right side of the trachea. Right hilar and azygoesophageal recess nodes measure up to 2.0 cm in short axis, concerning for metastatic disease. No pericardial effusion is  identified. Scattered prominent anterior mediastinal nodes are seen, measuring up to 1.7 cm in short axis. Lungs/Pleura: Bibasilar atelectasis is noted. No pleural effusion or pneumothorax is seen. No pulmonary nodules are identified. Upper Abdomen: The visualized portions of the liver and spleen are grossly unremarkable. Musculoskeletal: No acute osseous abnormalities are identified.  The visualized musculature is unremarkable in appearance. Review of the MIP images confirms the above findings. IMPRESSION: 1. No evidence of pulmonary embolus. 2. 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 Mild mass-effect on the right side of the trachea. 3. Right hilar and azygoesophageal recess nodes measure up to 2.0 cm in short axis. Prominent anterior mediastinal nodes measure up to 1.7 cm in short axis. These are concerning for metastatic disease. 4. Bibasilar atelectasis noted. These results were called by telephone at the time of interpretation on 12/26/2015 at 5:37 am to [facility] PA, who verbally acknowledged these results. Electronically Signed   By: Garald Balding M.D.   On: 12/26/2015 05:40   Mr Jeri Cos IO Contrast  Result Date: 12/26/2015 CLINICAL DATA:  Lung cancer. Headache. Assess for metastatic disease. EXAM: MRI HEAD WITHOUT AND WITH CONTRAST TECHNIQUE: Multiplanar, multiecho pulse sequences of the brain and surrounding structures were obtained without and with intravenous contrast. CONTRAST:  85m MULTIHANCE GADOBENATE DIMEGLUMINE 529 MG/ML IV SOLN COMPARISON:  CT 05/30/2015 FINDINGS: Brain: The brain has normal appearance without evidence of malformation, atrophy, old or acute small or large vessel infarction, hemorrhage, hydrocephalus or extra-axial collection. No pituitary abnormality. After contrast administration,  no abnormal enhancement occurs. Vascular: Major vessels at the base of the brain show flow. Skull and upper cervical spine: Normal Sinuses/Orbits: Clear/ normal. Other: None significant. IMPRESSION: Normal examination. No evidence of metastatic disease. No other finding to explain headache. Electronically Signed   By: MNelson ChimesM.D.   On: 12/26/2015 11:27    ASSESSMENT & PLAN:   50year old African-American female with history of hypertension, diabetes, asthma, dysuria mass and morbid obesity with   #1 Newly large right paratracheal mass with mediastinal adenopathy that appears to have grown significantly over the last 6-7 months. This is concerning for malignancy. Patient has been a lifelong nonsmoker - this would make small cell lung cancer somewhat less likely though cannot be ruled out. This could represent a lymphoma (though LDH wnl), thymoma/thymic carcinoma, lung cancer, mediastinal germ cell tumor etc. Though less likely it could potentially also represent a nonmalignant process such as sarcoidosis, Rosai-Dorfman disease, fibrosing mediastinitis, etc.   MRI of the brain was negative for any metastatic disease. Plan -All the findings thus far and possibilities were discussed in detail with the patient and her mother. -CT of the abdomen to complete the patient's staging workup. -Pulmonary has been consulted to consider either [place]irected biopsy. -Alternatively the right paratracheal mass appears to be extending into the right supraclavicular space and is clinically palpable . This could potentially be accessible for an ultrasound-guided biopsy and could be reviewed with interventional radiology . -If there is any evidence of metastatic disease in the abdomen that could also potentially serve as an alternative area for biopsy . -Would recommend at least a core biopsy since lymphoma is in the differential. Would need adequate tissue since the patient might need additional molecular studies  for flow cytometry /fish depending on tissue diagnosis .  #2 neoplasm related pain is currently controlled without significant medications. Clinically likely has sleep apnea . Would need to be careful with sedative medications . #3 hypertension   #4 diabetes #5 dyslipidemia #6 asthma Plan Further management as per hospital medicine team.  If patient is clinically stable with no evidence of  airway compromise she could potentially be discharged after appropriate tissue biopsy with a close follow-up with me(Dr Irene Limbo ) in clinic in about a week to follow-up on the biopsy results.    All of the patients and mothers questions were answered with apparent satisfaction. The patient knows to call the clinic with any problems, questions or concerns.  I spent 60 minutes counseling the patient face to face. The total time spent in the appointment was 80 minutes and more than 50% was on counseling and direct patient cares.    Sullivan Lone MD [place] AAHIVMS [facility] Hematology/Oncology Physician [facility]  (Office):       [phone] (Work cell):  [phone] (Fax):           [phone]  12/26/2015 4:42 PM

## 2015-12-26 NOTE — Progress Notes (Signed)
Oncology Note  Consult received from ED and case discussed with Dr Waldron Labs. I will see this patient this afternoon.  MRI brain and CT abd/pelvis in the works Pulmonary has been consulted for EBUS Bx  Sullivan Lone MD MS

## 2015-12-26 NOTE — ED Notes (Signed)
Attempted report 

## 2015-12-26 NOTE — ED Notes (Signed)
Patient transported to CT 

## 2015-12-26 NOTE — Progress Notes (Signed)
Verbal order received from [place] MD to give one time dose of NS 271m over an hour.

## 2015-12-26 NOTE — Progress Notes (Signed)
Patient arrived the unit from the ER assessment completed see flowsheet, placed on tele ccmd notified, patient oriented to room and staff, call light within reach bed in lowest position will continue to monitor

## 2015-12-26 NOTE — Consult Note (Signed)
Name: Marilyn [name] MRN: [id number] DOB: [birth year]    ADMISSION DATE:  12/26/2015 CONSULTATION DATE:  12/26/2015  REFERRING MD : Elgergawy / [facility]  CHIEF COMPLAINT:   New diagnosis large Right Paratracheal mass with mass effect suspicious for cancer.  BRIEF PATIENT DESCRIPTION:  Obese AA female in no distress, ambulating in room.  SIGNIFICANT EVENTS  09/2015: Initial ED admission for chest pain:imaging consistent with sarcoid, no follow up with PCP due to financial issues..  STUDIES:  CXR Chest: 12/26/2015:  Right hilar and right paratracheal lymphadenopathy is demonstrated, similar to prior study.  CT Abdomen Pelvis 9/23 >>  MRI Brain  9/22:  No masses.  CT Neck Soft Tissue 9/23>>  CT Angio: 12/26/2015 large 7.2 x 6.0 x 4.8 cm  right paratracheal mass with mild mass effect on the trachea. Right hilar and azygoesophageal recess nodes measure up to 2.0 cm in short axis. Prominent anterior mediastinal nodes measure up to 1.7 cm in short axis. These are concerning for metastatic disease.  HISTORY OF PRESENT ILLNESS:  Marilyn [name] is a 50 y.o. female with a hx of Asthma, insulin-dependent diabetes, high cholesterol, hypertension, obesity presents to the Emergency Department complaining of gradual, persistent, progressively worsening central and right sided chest pain with associated shortness of breath onset Acutely worsening at 9:30 PM.  She endorses intermittent palpitations & near syncope. Patient reports she has had associated cough and denies weight loss.( Per patient intentional with diet and exercise)  She reports persistent chest discomfort for several weeks.( Since June 2017) She was seen in ED 09/2015 with chest pain. CXR was suspicious for sarcoid, but patient did not follow up with her PCP due to financial issues. Per patient no family history of any connective tissue disorders. She  Denies night sweats. She endorses bilateral peripheral edema but states this is baseline for  her. Patient reports she is a never smoker. She has asthma but does not take any medication for it, and does not use a rescue inhaler. No sick contacts. No fevers, chills, rhinorrhea, sore throat or otalgia.  Patient also reports a history of insulin-dependent diabetes. She has been out of her insulin for approximately one week. Denies any wheezing. No rashes or abnormal bruising. No adenopathy in her neck, groin or axilla. No sore throat or sinus congestion. Mild abdominal discomfort. Reports remote history of hematochezia but none recently. Reportedly has had negative colonoscopies since then without any evidence or source of bleeding. Reports last Pap smear was some years ago and denies any abnormal Paps. Reports her menses occur every 3 months without excessively heavy bleeding. Denies any intermittent bleeding or spotting. Has had some mild abdominal pain but denies any diarrhea. Reports her last mammogram was within the year and reportedly normal.  CT Angio was negative for PE, but positive for large right paratracheal mass with lymphadenopathy suspicious for cancer. CCM has been consulted for possible EBUS biopsy.She has been seen by oncology who have ordered MRI brain and CT abdomen/ pelvis. Cardiac work up was negative.  PAST MEDICAL HISTORY: Past Medical History:  Diagnosis Date  . Asthma   . Diabetes mellitus   . Hypercholesteremia   . Hypertension   . Obese      PAST SURGICAL HISTORY: Past Surgical History:  Procedure Laterality Date  . CESAREAN SECTION    . TUBAL LIGATION      Prior to Admission medications   Medication Sig Start Date End Date Taking? Authorizing Provider  Insulin Degludec (TRESIBA FLEXTOUCH)  200 UNIT/ML SOPN Inject 100 Units into the skin daily before breakfast.    Yes Historical Provider, MD  Liraglutide (VICTOZA) 18 MG/3ML SOPN Inject 1.8 mg into the skin daily.   Yes Historical Provider, MD   No Known Allergies  FAMILY HISTORY:  Family History  Problem  Relation Age of Onset  . Cancer Neg Hx   . Rheumatologic disease Neg Hx     SOCIAL HISTORY: Social History   Social History  . Marital status: Divorced    Spouse name: N/A  . Number of children: N/A  . Years of education: N/A   Social History Main Topics  . Smoking status: Never Smoker  . Smokeless tobacco: Never Used  . Alcohol use No  . Drug use: No  . Sexual activity: Not Asked   Other Topics Concern  . None   Social History Narrative   No bird or mold exposure. No recent travel.    REVIEW OF SYSTEMS:  Reports joint stiffness and pain in her hands/fingers and knees bilaterally. Stiffness lasts but minutes in the morning and resolves. Denies any joint swelling or erythema. He denies any odynophagia but has had dysphagia. A pertinent 14 point review of systems is negative except as per the history of presenting illness.  SUBJECTIVE:  Pt. States she feels better, but continues to have chest pain consistent with what she has been experiencing since June 2017.  VITAL SIGNS: Temp:  [97.5 F (36.4 C)-98.8 F (37.1 C)] 97.5 F (36.4 C) (09/22 1417) Pulse Rate:  [87-133] 124 (09/22 1417) Resp:  [16-25] 19 (09/22 1417) BP: (119-152)/(63-97) 122/83 (09/22 1417) SpO2:  [97 %-100 %] 98 % (09/22 1417) Weight:  [298 lb (135.2 kg)] 298 lb (135.2 kg) (09/21 2020)  PHYSICAL EXAMINATION:  General: No distress,  A&Ox3, obese, pleasant female ENT: No sinus tenderness, TM clear, pale nasal mucosa, no oral exudate,no post nasal drip, no LAN, poor dentation Cardiac: S1, S2, regular rate and rhythm, no murmur Lymphatics:  No appreciated cervical or supraclavicular LAD. Chest: No wheeze/ rales/ dullness; no accessory muscle use, no nasal flaring, no sternal retractions Abd.: Soft Non-tender Ext: No clubbing cyanosis, 1+ edema bilateral lower extremities Neuro: CN 2-12 in tact. No meningismus. Symmetric DTRs. Musculoskeletal:  Normal bulk and tone. No joint effusion or deformity. Skin:  No rashes, warm and dry Psych: normal mood and behavior. Alert & oriented x4.   Recent Labs Lab 12/25/15 2016  NA 134*  K 3.7  CL 99*  CO2 26  BUN 7  CREATININE 0.71  GLUCOSE 352*    Recent Labs Lab 12/25/15 2016  HGB 10.3*  HCT 33.7*  WBC 8.9  PLT 377   Dg Chest 2 View  Result Date: 12/25/2015 CLINICAL DATA:  Generalize chronic chest pain, shortness of breath, and cough. History of asthma, diabetes, and hypertension. EXAM: CHEST  2 VIEW COMPARISON:  09/15/2015 FINDINGS: Right hilar and right peritracheal lymphadenopathy is demonstrated similar to prior study. Lungs are clear and expanded. No focal airspace disease or consolidation. No blunting of costophrenic angles. No pneumothorax. Normal heart size and pulmonary vascularity. Mild degenerative changes in the spine. IMPRESSION: Right hilar and right paratracheal lymphadenopathy is demonstrated, similar to prior study. No evidence of active pulmonary disease. Electronically Signed   By: Lucienne Capers M.D.   On: 12/25/2015 21:23   Ct Angio Chest Pe W Or Wo Contrast  Result Date: 12/26/2015 CLINICAL DATA:  Acute onset of generalized chest pain, tachycardia, high blood pressure and shortness of  breath. Initial encounter. EXAM: CT ANGIOGRAPHY CHEST WITH CONTRAST TECHNIQUE: Multidetector CT imaging of the chest was performed using the standard protocol during bolus administration of intravenous contrast. Multiplanar CT image reconstructions and MIPs were obtained to evaluate the vascular anatomy. CONTRAST:  100 mL of Isovue 370 IV contrast COMPARISON:  Chest radiograph performed 12/25/2015, and CTA of the chest performed 05/30/2015 FINDINGS: Cardiovascular: There is no evidence of pulmonary embolus. There is mild mass effect on vasculature from the patient's mediastinal and right hilar masses. The thoracic aorta remains grossly intact. The great vessels are grossly unremarkable in appearance. Mediastinum/Nodes: There is a large 7.2 x 6.0  x 4.8 cm mass at the right paratracheal region, markedly enlarged from the prior study, partially encasing the right side of the trachea and the right mainstem bronchus, highly suspicious for small cell lung cancer. There is mild mass effect on the right side of the trachea. Right hilar and azygoesophageal recess nodes measure up to 2.0 cm in short axis, concerning for metastatic disease. No pericardial effusion is identified. Scattered prominent anterior mediastinal nodes are seen, measuring up to 1.7 cm in short axis. Lungs/Pleura: Bibasilar atelectasis is noted. No pleural effusion or pneumothorax is seen. No pulmonary nodules are identified. Upper Abdomen: The visualized portions of the liver and spleen are grossly unremarkable. Musculoskeletal: No acute osseous abnormalities are identified. The visualized musculature is unremarkable in appearance. Review of the MIP images confirms the above findings. IMPRESSION: 1. No evidence of pulmonary embolus. 2. 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 Mild mass-effect on the right side of the trachea. 3. Right hilar and azygoesophageal recess nodes measure up to 2.0 cm in short axis. Prominent anterior mediastinal nodes measure up to 1.7 cm in short axis. These are concerning for metastatic disease. 4. Bibasilar atelectasis noted. These results were called by telephone at the time of interpretation on 12/26/2015 at 5:37 am to [facility], who verbally acknowledged these results. Electronically Signed   By: Garald Balding M.D.   On: 12/26/2015 05:40   Mr Jeri Cos TK Contrast  Result Date: 12/26/2015 CLINICAL DATA:  Lung cancer. Headache. Assess for metastatic disease. EXAM: MRI HEAD WITHOUT AND WITH CONTRAST TECHNIQUE: Multiplanar, multiecho pulse sequences of the brain and  surrounding structures were obtained without and with intravenous contrast. CONTRAST:  28m MULTIHANCE GADOBENATE DIMEGLUMINE 529 MG/ML IV SOLN COMPARISON:  CT 05/30/2015 FINDINGS: Brain: The brain has normal appearance without evidence of malformation, atrophy, old or acute small or large vessel infarction, hemorrhage, hydrocephalus or extra-axial collection. No pituitary abnormality. After contrast administration, no abnormal enhancement occurs. Vascular: Major vessels at the base of the brain show flow. Skull and upper cervical spine: Normal Sinuses/Orbits: Clear/ normal. Other: None significant. IMPRESSION: Normal examination. No evidence of metastatic disease. No other finding to explain headache. Electronically Signed   By: MNelson ChimesM.D.   On: 12/26/2015 11:27    ASSESSMENT / PLAN:  Chest pain since 09/2015 Large right paratracheal mass with mild mass effect on the trachea and  Lymphadenopathy concerning for malignancy per CTA. No respiratory distress or tracheal deviation Remote History of Asthma ( No maintenance medication)  Plan: CT abdomen and pelvis/ Soft Tissues and neck IVF's to protect renal function prior to CT's ( Results for RMAKIYAH, ZENTZ(MRN [id number] as of 12/26/2015 08:59  Ref. Range 12/25/2015 20:16  BUN Latest Ref Range: 6 - 20 mg/dL 7  Creatinine Latest Ref Range: 0.44 - 1.00 mg/dL 0.71   Plan for biopsy of mass for tissue diagnosis pending results of CT scans Ordered Diet as patient will not be bronched today ( 9/22). Oxygen as needed to maintain oxygen saturations > 92% PRN albuterol for shortness of breath Ordered LDH & ACE level.  Tera Partridge, MD Pulmonary and [place] Pager: [phone] 12/26/2015, 5:11 PM

## 2015-12-27 ENCOUNTER — Encounter (HOSPITAL_COMMUNITY): Payer: Self-pay | Admitting: Radiology

## 2015-12-27 ENCOUNTER — Observation Stay (HOSPITAL_COMMUNITY): Payer: BC Managed Care – PPO

## 2015-12-27 DIAGNOSIS — Z794 Long term (current) use of insulin: Secondary | ICD-10-CM | POA: Diagnosis not present

## 2015-12-27 DIAGNOSIS — R000 Tachycardia, unspecified: Secondary | ICD-10-CM | POA: Diagnosis not present

## 2015-12-27 DIAGNOSIS — R918 Other nonspecific abnormal finding of lung field: Secondary | ICD-10-CM | POA: Diagnosis not present

## 2015-12-27 DIAGNOSIS — E119 Type 2 diabetes mellitus without complications: Secondary | ICD-10-CM | POA: Diagnosis not present

## 2015-12-27 DIAGNOSIS — J9859 Other diseases of mediastinum, not elsewhere classified: Secondary | ICD-10-CM | POA: Diagnosis not present

## 2015-12-27 LAB — BASIC METABOLIC PANEL
ANION GAP: 9 (ref 5–15)
BUN: 6 mg/dL (ref 6–20)
CALCIUM: 8.7 mg/dL — AB (ref 8.9–10.3)
CO2: 26 mmol/L (ref 22–32)
CREATININE: 0.58 mg/dL (ref 0.44–1.00)
Chloride: 104 mmol/L (ref 101–111)
Glucose, Bld: 258 mg/dL — ABNORMAL HIGH (ref 65–99)
Potassium: 3.8 mmol/L (ref 3.5–5.1)
Sodium: 139 mmol/L (ref 135–145)

## 2015-12-27 LAB — GLUCOSE, CAPILLARY
GLUCOSE-CAPILLARY: 199 mg/dL — AB (ref 65–99)
GLUCOSE-CAPILLARY: 260 mg/dL — AB (ref 65–99)
GLUCOSE-CAPILLARY: 272 mg/dL — AB (ref 65–99)
GLUCOSE-CAPILLARY: 288 mg/dL — AB (ref 65–99)
GLUCOSE-CAPILLARY: 297 mg/dL — AB (ref 65–99)
Glucose-Capillary: 249 mg/dL — ABNORMAL HIGH (ref 65–99)
Glucose-Capillary: 228 mg/dL — ABNORMAL HIGH (ref 65–99)

## 2015-12-27 LAB — SEDIMENTATION RATE: SED RATE: 48 mm/h — AB (ref 0–22)

## 2015-12-27 LAB — CBC
HCT: 31.5 % — ABNORMAL LOW (ref 36.0–46.0)
HEMOGLOBIN: 9.7 g/dL — AB (ref 12.0–15.0)
MCH: 26 pg (ref 26.0–34.0)
MCHC: 30.8 g/dL (ref 30.0–36.0)
MCV: 84.5 fL (ref 78.0–100.0)
PLATELETS: 337 10*3/uL (ref 150–400)
RBC: 3.73 MIL/uL — AB (ref 3.87–5.11)
RDW: 13.1 % (ref 11.5–15.5)
WBC: 7.6 10*3/uL (ref 4.0–10.5)

## 2015-12-27 LAB — C-REACTIVE PROTEIN: CRP: 8.9 mg/dL — ABNORMAL HIGH (ref <1.0)

## 2015-12-27 MED ORDER — INSULIN ASPART 100 UNIT/ML ~~LOC~~ SOLN
0.0000 [IU] | Freq: Every day | SUBCUTANEOUS | Status: DC
  Administered 2015-12-27 – 2015-12-29 (×3): 3 [IU] via SUBCUTANEOUS

## 2015-12-27 MED ORDER — INSULIN ASPART 100 UNIT/ML ~~LOC~~ SOLN
0.0000 [IU] | Freq: Three times a day (TID) | SUBCUTANEOUS | Status: DC
  Administered 2015-12-27 – 2015-12-28 (×2): 5 [IU] via SUBCUTANEOUS
  Administered 2015-12-28 – 2015-12-29 (×4): 3 [IU] via SUBCUTANEOUS
  Administered 2015-12-29: 9 [IU] via SUBCUTANEOUS

## 2015-12-27 MED ORDER — IOPAMIDOL (ISOVUE-300) INJECTION 61%
INTRAVENOUS | Status: AC
  Administered 2015-12-27: 100 mL
  Filled 2015-12-27: qty 100

## 2015-12-27 MED ORDER — IOPAMIDOL (ISOVUE-300) INJECTION 61%
INTRAVENOUS | Status: AC
  Administered 2015-12-27: 30 mL
  Filled 2015-12-27: qty 30

## 2015-12-27 NOTE — Progress Notes (Signed)
PROGRESS NOTE  Marilyn [name] ZOX:[id number] DOB: 1965/04/20 DOA: 12/26/2015 PCP: Marilyn German, MD  HPI/Recap of past 24 hours:  Still has chest pain ,some intermittent nonproductive cough  Assessment/Plan: Active Problems:   Chest pain   Lung mass   Headache   Diabetes mellitus (HCC)  Lung mass with chest pain - This is highly suspicious of malignancy,  -mri brain unremarkable, ct ab/ct soft tissue neck pending -pulmonology and oncology input appreciated.  Insulin dependent Diabetes mellitus - Hold oral hypoglycemic agent, will keep on insulin sliding scale every 4 hours, can be changed to before meals when she is not nothing by mouth(currently she is nothing by mouth for possible procedure today)  Headache - MRI brain unremarkable  Morbid obesity: Body mass index is 44.01 kg/m.     DVT Prophylaxis SCDs, no chemical DVT prophylaxis pending bronchoscopy     Code Status: full   Family Communication: patient and her mother  Disposition Plan: home in a few days   Consultants:  Pulmonology  oncology  Procedures:  none  Antibiotics:  none   Objective: BP 106/79 (BP Location: Right Arm)   Pulse (!) 117   Temp 97.9 F (36.6 C) (Oral)   Resp (!) 22   Ht 5\' 9"  (1.753 m)   Wt 135.2 kg (298 lb)   LMP 11/23/2015 (Approximate)   SpO2 100%   BMI 44.01 kg/m   Intake/Output Summary (Last 24 hours) at 12/27/15 0741 Last data filed at 12/26/15 1700  Gross per 24 hour  Intake           848.75 ml  Output                0 ml  Net           848.75 ml   Filed Weights   12/25/15 2020  Weight: 135.2 kg (298 lb)    Exam:   General:  NAD, obese  Cardiovascular: RRR  Respiratory: CTABL  Abdomen: Soft/ND/NT, positive BS  Musculoskeletal: No Edema  Neuro: aaox3  Data Reviewed: Basic Metabolic Panel:  Recent Labs Lab 12/25/15 2016 12/27/15 0156  NA 134* 139  K 3.7 3.8  CL 99* 104  CO2 26 26  GLUCOSE 352* 258*  BUN 7 6    CREATININE 0.71 0.58  CALCIUM 9.2 8.7*   Liver Function Tests: No results for input(s): AST, ALT, ALKPHOS, BILITOT, PROT, ALBUMIN in the last 168 hours. No results for input(s): LIPASE, AMYLASE in the last 168 hours. No results for input(s): AMMONIA in the last 168 hours. CBC:  Recent Labs Lab 12/25/15 2016 12/27/15 0156  WBC 8.9 7.6  HGB 10.3* 9.7*  HCT 33.7* 31.5*  MCV 84.0 84.5  PLT 377 337   Cardiac Enzymes:    Recent Labs Lab 12/26/15 1006 12/26/15 1541 12/26/15 2035  TROPONINI 0.04* 0.03* <0.03   BNP (last 3 results)  Recent Labs  12/25/15 2016  BNP 20.8    ProBNP (last 3 results) No results for input(s): PROBNP in the last 8760 hours.  CBG:  Recent Labs Lab 12/26/15 1159 12/26/15 1620 12/26/15 1955 12/27/15 0017 12/27/15 0416  GLUCAP 317* 278* 254* 199* 249*    No results found for this or any previous visit (from the past 240 hour(s)).   Studies: Mr Laqueta Jean WJ Contrast  Result Date: 12/26/2015 CLINICAL DATA:  Lung cancer. Headache. Assess for metastatic disease. EXAM: MRI HEAD WITHOUT AND WITH CONTRAST TECHNIQUE: Multiplanar, multiecho pulse sequences of the brain  and surrounding structures were obtained without and with intravenous contrast. CONTRAST:  20mL MULTIHANCE GADOBENATE DIMEGLUMINE 529 MG/ML IV SOLN COMPARISON:  CT 05/30/2015 FINDINGS: Brain: The brain has normal appearance without evidence of malformation, atrophy, old or acute small or large vessel infarction, hemorrhage, hydrocephalus or extra-axial collection. No pituitary abnormality. After contrast administration, no abnormal enhancement occurs. Vascular: Major vessels at the base of the brain show flow. Skull and upper cervical spine: Normal Sinuses/Orbits: Clear/ normal. Other: None significant. IMPRESSION: Normal examination. No evidence of metastatic disease. No other finding to explain headache. Electronically Signed   By: Paulina Fusi M.D.   On: 12/26/2015 11:27    Scheduled  Meds: . insulin aspart  0-9 Units Subcutaneous Q4H  . insulin glargine  15 Units Subcutaneous Daily  . sodium chloride flush  3 mL Intravenous Q12H    Continuous Infusions: . sodium chloride 75 mL/hr at 12/26/15 1205  . dextrose 5 % and 0.45% NaCl Stopped (12/26/15 1127)     Time spent:  Marilyn Westby MD, PhD  [facility] Pager [phone]. If 7PM-7AM, please contact night-coverage at [email or web address], password [facility] 12/27/2015, 7:41 AM  LOS: 0 days

## 2015-12-27 NOTE — Progress Notes (Signed)
Patient not at bedside  D./w Dr Carolyne Fiscal - try to get supraclav biopsy as oppised to mediastinum or if abd ct shows soemthing more easily accessible. Will let Dr Erlinda Hong know to coordinate. PCCM will see again 12/29/15  Dr. Brand Males, M.D., [facility].C.P Pulmonary and Critical Care Medicine Staff Physician [place] [facility] Pager: [phone], If no answer or between  15:00h - 7:00h: call [phone]  12/27/2015 1:24 PM

## 2015-12-28 ENCOUNTER — Observation Stay (HOSPITAL_COMMUNITY): Payer: BC Managed Care – PPO

## 2015-12-28 DIAGNOSIS — E119 Type 2 diabetes mellitus without complications: Secondary | ICD-10-CM | POA: Diagnosis not present

## 2015-12-28 DIAGNOSIS — J9859 Other diseases of mediastinum, not elsewhere classified: Secondary | ICD-10-CM | POA: Diagnosis not present

## 2015-12-28 DIAGNOSIS — R918 Other nonspecific abnormal finding of lung field: Secondary | ICD-10-CM | POA: Diagnosis not present

## 2015-12-28 DIAGNOSIS — R0602 Shortness of breath: Secondary | ICD-10-CM | POA: Diagnosis present

## 2015-12-28 DIAGNOSIS — C3401 Malignant neoplasm of right main bronchus: Secondary | ICD-10-CM | POA: Diagnosis present

## 2015-12-28 DIAGNOSIS — G4730 Sleep apnea, unspecified: Secondary | ICD-10-CM | POA: Diagnosis present

## 2015-12-28 DIAGNOSIS — Z6841 Body Mass Index (BMI) 40.0 and over, adult: Secondary | ICD-10-CM | POA: Diagnosis not present

## 2015-12-28 DIAGNOSIS — R000 Tachycardia, unspecified: Secondary | ICD-10-CM | POA: Diagnosis present

## 2015-12-28 DIAGNOSIS — Z794 Long term (current) use of insulin: Secondary | ICD-10-CM | POA: Diagnosis not present

## 2015-12-28 DIAGNOSIS — I10 Essential (primary) hypertension: Secondary | ICD-10-CM | POA: Diagnosis present

## 2015-12-28 DIAGNOSIS — M79674 Pain in right toe(s): Secondary | ICD-10-CM | POA: Diagnosis present

## 2015-12-28 DIAGNOSIS — J45909 Unspecified asthma, uncomplicated: Secondary | ICD-10-CM | POA: Diagnosis present

## 2015-12-28 DIAGNOSIS — E1165 Type 2 diabetes mellitus with hyperglycemia: Secondary | ICD-10-CM | POA: Diagnosis present

## 2015-12-28 DIAGNOSIS — E7800 Pure hypercholesterolemia, unspecified: Secondary | ICD-10-CM | POA: Diagnosis present

## 2015-12-28 DIAGNOSIS — C770 Secondary and unspecified malignant neoplasm of lymph nodes of head, face and neck: Secondary | ICD-10-CM | POA: Diagnosis present

## 2015-12-28 LAB — COMPREHENSIVE METABOLIC PANEL
ALT: 12 U/L — ABNORMAL LOW (ref 14–54)
AST: 14 U/L — ABNORMAL LOW (ref 15–41)
Albumin: 3.1 g/dL — ABNORMAL LOW (ref 3.5–5.0)
Alkaline Phosphatase: 77 U/L (ref 38–126)
Anion gap: 7 (ref 5–15)
BUN: 6 mg/dL (ref 6–20)
CO2: 27 mmol/L (ref 22–32)
Calcium: 8.9 mg/dL (ref 8.9–10.3)
Chloride: 104 mmol/L (ref 101–111)
Creatinine, Ser: 0.58 mg/dL (ref 0.44–1.00)
GFR calc Af Amer: >60 mL/min (ref >60)
GFR calc non Af Amer: >60 mL/min (ref >60)
Glucose, Bld: 256 mg/dL — ABNORMAL HIGH (ref 65–99)
Potassium: 3.8 mmol/L (ref 3.5–5.1)
Sodium: 138 mmol/L (ref 135–145)
Total Bilirubin: 0.2 mg/dL — ABNORMAL LOW (ref 0.3–1.2)
Total Protein: 7.1 g/dL (ref 6.5–8.1)

## 2015-12-28 LAB — CBC
HCT: 31.9 % — ABNORMAL LOW (ref 36.0–46.0)
Hemoglobin: 9.9 g/dL — ABNORMAL LOW (ref 12.0–15.0)
MCH: 26.1 pg (ref 26.0–34.0)
MCHC: 31 g/dL (ref 30.0–36.0)
MCV: 83.9 fL (ref 78.0–100.0)
Platelets: 361 10*3/uL (ref 150–400)
RBC: 3.8 MIL/uL — ABNORMAL LOW (ref 3.87–5.11)
RDW: 13.1 % (ref 11.5–15.5)
WBC: 7.6 10*3/uL (ref 4.0–10.5)

## 2015-12-28 LAB — SURGICAL PCR SCREEN
MRSA, PCR: NEGATIVE
Staphylococcus aureus: NEGATIVE

## 2015-12-28 LAB — PROTIME-INR
INR: 1.08
Prothrombin Time: 14 seconds (ref 11.4–15.2)

## 2015-12-28 LAB — GLUCOSE, CAPILLARY
Glucose-Capillary: 219 mg/dL — ABNORMAL HIGH (ref 65–99)
Glucose-Capillary: 222 mg/dL — ABNORMAL HIGH (ref 65–99)
Glucose-Capillary: 290 mg/dL — ABNORMAL HIGH (ref 65–99)
Glucose-Capillary: 279 mg/dL — ABNORMAL HIGH (ref 65–99)

## 2015-12-28 LAB — TSH: TSH: 1.531 u[IU]/mL (ref 0.350–4.500)

## 2015-12-28 MED ORDER — METOPROLOL TARTRATE 12.5 MG HALF TABLET
12.5000 mg | ORAL_TABLET | Freq: Two times a day (BID) | ORAL | Status: DC
  Administered 2015-12-28 – 2015-12-29 (×3): 12.5 mg via ORAL
  Filled 2015-12-28 (×3): qty 1

## 2015-12-28 NOTE — Progress Notes (Signed)
PROGRESS NOTE  Marilyn [name] GNF:[id number] DOB: May 30, 1965 DOA: 12/26/2015 PCP: Dorrene German, MD  HPI/Recap of past 24 hours:  Sitting in chair , NAD,  Report chronic back pain, back pain got worse on hospital bed Better when sitting up C/o right great toe pain  Mother in room  Assessment/Plan: Active Problems:   Chest pain   Lung mass   Headache   Diabetes mellitus (HCC)   Mediastinal mass  Lung mass with chest pain - This is highly suspicious of malignancy,  -mri brain unremarkable, ct soft tissue neck "Bulky lymphadenopathy in the right lower neck and supraclavicular regions, consistent with metastatic disease" Ct abdomen as described below. -I dicussed the case with interventional radiology Dr Deanne Coffer who recommend biopsy of supraclavicular nodes on Monday with pathology in house. pulmonology and oncology also following, input appreciated.  Insulin dependent Diabetes mellitus - a1c pending -Hold oral hypoglycemic agent, on insulin , adjust prn  Headache - MRI brain unremarkable  Right toe pain, no significant erythema or edema, will check uric acid and foot x ray  Morbid obesity: Body mass index is 44.01 kg/m.    DVT Prophylaxis SCDs, no chemical DVT prophylaxis pending bronchoscopy     Code Status: full   Family Communication: patient and her mother  Disposition Plan: home in a few days   Consultants:  Pulmonology  Oncology  IR  Procedures:  none  Antibiotics:  none   Objective: BP 119/72 (BP Location: Right Arm)   Pulse (!) 125   Temp 98 F (36.7 C) (Oral)   Resp 18   Ht 5\' 9"  (1.753 m)   Wt 135.2 kg (298 lb)   LMP 11/23/2015 (Approximate)   SpO2 99%   BMI 44.01 kg/m  No intake or output data in the 24 hours ending 12/28/15 0815 Filed Weights   12/25/15 2020  Weight: 135.2 kg (298 lb)    Exam:   General:  NAD, obese  Cardiovascular: RRR  Respiratory: CTABL  Abdomen: Soft/ND/NT, positive  BS  Musculoskeletal: No Edema  Neuro: aaox3  Data Reviewed: Basic Metabolic Panel:  Recent Labs Lab 12/25/15 2016 12/27/15 0156 12/28/15 0214  NA 134* 139 138  K 3.7 3.8 3.8  CL 99* 104 104  CO2 26 26 27   GLUCOSE 352* 258* 256*  BUN 7 6 6   CREATININE 0.71 0.58 0.58  CALCIUM 9.2 8.7* 8.9   Liver Function Tests:  Recent Labs Lab 12/28/15 0214  AST 14*  ALT 12*  ALKPHOS 77  BILITOT 0.2*  PROT 7.1  ALBUMIN 3.1*   No results for input(s): LIPASE, AMYLASE in the last 168 hours. No results for input(s): AMMONIA in the last 168 hours. CBC:  Recent Labs Lab 12/25/15 2016 12/27/15 0156 12/28/15 0214  WBC 8.9 7.6 7.6  HGB 10.3* 9.7* 9.9*  HCT 33.7* 31.5* 31.9*  MCV 84.0 84.5 83.9  PLT 377 337 361   Cardiac Enzymes:    Recent Labs Lab 12/26/15 1006 12/26/15 1541 12/26/15 2035  TROPONINI 0.04* 0.03* <0.03   BNP (last 3 results)  Recent Labs  12/25/15 2016  BNP 20.8    ProBNP (last 3 results) No results for input(s): PROBNP in the last 8760 hours.  CBG:  Recent Labs Lab 12/27/15 1125 12/27/15 1558 12/27/15 1937 12/27/15 2126 12/28/15 0623  GLUCAP 272* 288* 260* 297* 219*    Recent Results (from the past 240 hour(s))  Surgical pcr screen     Status: None   Collection Time: 12/28/15  12:14 AM  Result Value Ref Range Status   MRSA, PCR NEGATIVE NEGATIVE Final   Staphylococcus aureus NEGATIVE NEGATIVE Final    Comment:        The Xpert SA Assay (FDA approved for NASAL specimens in patients over 67 years of age), is one component of a comprehensive surveillance program.  Test performance has been validated by [facility] for patients greater than or equal to 55 year old. It is not intended to diagnose infection nor to guide or monitor treatment.      Studies: Ct Soft Tissue Neck W Contrast  Result Date: 12/27/2015 CLINICAL DATA:  Sore throat with right-sided swelling. Mediastinal mass on chest CT. EXAM: CT NECK WITH CONTRAST  TECHNIQUE: Multidetector CT imaging of the neck was performed using the standard protocol following the bolus administration of intravenous contrast. CONTRAST:  ISOVUE-300 IOPAMIDOL (ISOVUE-300) INJECTION 61% COMPARISON:  CTA neck 05/30/2015 FINDINGS: Pharynx and larynx: No pharyngeal mass or inflammatory change. Unremarkable larynx. Salivary glands: Submandibular and parotid glands are unremarkable. Thyroid: Unremarkable. Lymph nodes: There are bulky right level IV and supraclavicular lymph nodes which are new from the prior CTA. These measure up to at least 2 cm in short axis, with evaluation mildly limited by beam hardening. Enlarged right level V lymph nodes are also new and measure up to 1.5 cm in short axis. Subcentimeter short axis lymph nodes in the right neck more superiorly and scattered throughout the left neck (including an 8 mm short axis left level IV lymph node) are all similar to the prior CTA. Vascular: Major vascular structures of the neck appear patent. 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 Limited intracranial: Unremarkable. Visualized orbits: Not imaged. Mastoids and visualized paranasal sinuses: Clear. Skeleton: Mild lower cervical spondylosis. No destructive osseous lesion identified. subcentimeter sclerotic focus in the base of the C2 spinous processes unchanged, possibly a bone island. Upper chest: Bulky mediastinal lymphadenopathy, more fully evaluated on yesterday's chest CT. Other: None. IMPRESSION: 1. Unremarkable appearance of the pharynx. No evidence of acute process to explain sore throat. 2. Bulky lymphadenopathy in the right lower neck and supraclavicular regions, consistent with metastatic disease. Electronically Signed   By: Sebastian Ache M.D.   On: 12/27/2015 14:08   Ct Abdomen Pelvis W Contrast  Addendum Date: 12/27/2015   ADDENDUM REPORT: 12/27/2015 14:12 ADDENDUM: There is questionable sludge within the gallbladder. It may be  prudent to consider ultrasound of the right upper quadrant given questionable sludge in the gallbladder as well as prominence of the common bile duct. Electronically Signed   By: Bretta Bang III M.D.   On: 12/27/2015 14:12   Result Date: 12/27/2015 CLINICAL DATA:  Chest adenopathy with concern for metastatic focus EXAM: CT ABDOMEN AND PELVIS WITH CONTRAST TECHNIQUE: Multidetector CT imaging of the abdomen and pelvis was performed using the standard protocol following bolus administration of intravenous contrast. Oral contrast was also administered. CONTRAST:  ISOVUE-300 IOPAMIDOL (ISOVUE-300) INJECTION 61% COMPARISON:  CT abdomen and pelvis May 18, 2010; chest CT December 26, 2015 FINDINGS: Lower chest: Focal opacity in the posterior aspect of the superior segment right lower lobe is increased from 1 day prior and is consistent with either increased atelectasis or new focus infiltrate in this area. Lung bases otherwise are clear. Hepatobiliary: No focal liver lesions are evident. There is no appreciable gallbladder wall thickening. There is prominence of the common bile duct 10 mm without mass or calculus evident in the common  bile duct on this study. Pancreas: There is no pancreatic mass or fluid collection. Spleen: No splenic lesions are evident. Adrenals/Urinary Tract: There is a 1.1 x 0.9 cm mass in the left adrenal. Right adrenal appears normal. Kidneys bilaterally show no demonstrable mass or hydronephrosis on either side. There is no renal or ureteral calculus on either side. Urinary bladder is midline with wall thickness within normal limits. Stomach/Bowel: There is fairly diffuse stool throughout colon. There is no bowel wall or mesenteric thickening. No bowel obstruction. No free air or portal venous air. Vascular/Lymphatic: There is no abdominal aortic aneurysm. No vascular lesions are evident. There are a few scattered subcentimeter lymph nodes in the mesentery. By size criteria,  there is no adenopathy in the abdomen or pelvis. Reproductive: Uterus is retroverted. There is no pelvic mass or pelvic fluid collection. Other: Appendix appears unremarkable. There is no ascites or abscess in the abdomen or pelvis. There is a small ventral hernia containing only fat. No omental lesions are demonstrable. Musculoskeletal: There are foci of degenerative change in the lumbar spine. There are no blastic or lytic bone lesions. There is no intramuscular or abdominal wall lesion evident. IMPRESSION: Small mass in left adrenal. A small metastasis must be of concern in this region given the findings on recent chest CT. No adenopathy is apparent in the abdomen or pelvis. There are scattered subcentimeter mesenteric lymph nodes which do not meet size criteria for pathologic significance. No liver lesions are evident. No inflammatory foci are appreciated. No bowel obstruction. No abscess. No renal or ureteral calculus. No hydronephrosis. There is a small ventral hernia containing only fat. There is prominence of the common bile duct without mass or calculus evident. Significance of this finding is uncertain. Electronically Signed: By: Bretta Bang III M.D. On: 12/27/2015 14:08    Scheduled Meds: . insulin aspart  0-5 Units Subcutaneous QHS  . insulin aspart  0-9 Units Subcutaneous TID WC  . insulin glargine  15 Units Subcutaneous Daily  . sodium chloride flush  3 mL Intravenous Q12H    Continuous Infusions:     Time spent:  Mahima Hottle MD, PhD  [facility] Pager [phone]. If 7PM-7AM, please contact night-coverage at [email or web address], password [facility] 12/28/2015, 8:15 AM  LOS: 0 days

## 2015-12-28 NOTE — Consult Note (Signed)
Chief Complaint: Patient was seen in consultation today for US guided biopsy of right supraclavicular lymph node Chief Complaint  Patient presents with  . Chest Pain  . Shortness of Breath    Referring Physician(s): [place]  Supervising Physician: Jacqulynn Cadet  Patient Status: Inpatient  History of Present Illness: Marilyn [name] is a 50 y.o. female with history as listed below who presented to [facility] on 12/26/15 with chest pain, dyspnea and cough. Symptoms have been persistent for past 3-4 months. 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 There was mild mass-effect on the right side of the trachea as well as bulky adenopathy in rt lower neck/[place] regions. No PE was noted. Also noted was small left adrenal mass. Brain MRI was negative. EKG showed sinus tach, troponins mildly elevated, TSH nl. Request received from oncology for image guided biopsy of most accessible site.   Past Medical History:  Diagnosis Date  . Asthma   . Diabetes mellitus   . Hypercholesteremia   . Hypertension   . Obese     Past Surgical History:  Procedure Laterality Date  . CESAREAN SECTION    . TUBAL LIGATION      Allergies: Review of patient's allergies indicates no known allergies.  Medications: Prior to Admission medications   Medication Sig Start Date End Date Taking? Authorizing Provider  Insulin Degludec (TRESIBA FLEXTOUCH) 200 UNIT/ML SOPN Inject 100 Units into the skin daily before breakfast.    Yes Historical Provider, MD  Liraglutide (VICTOZA) 18 MG/3ML SOPN Inject 1.8 mg into the skin daily.   Yes Historical Provider, MD     Family History  Problem Relation Age of Onset  . Cancer Neg Hx   . Rheumatologic disease Neg Hx     Social History   Social History  . Marital status: Divorced    Spouse name: N/A  . Number of children: N/A  . Years of education:  N/A   Social History Main Topics  . Smoking status: Never Smoker  . Smokeless tobacco: Never Used  . Alcohol use No  . Drug use: No  . Sexual activity: Not Asked   Other Topics Concern  . None   Social History Narrative   No bird or mold exposure. No recent travel.      Review of Systems currently denies fever,  dyspnea, cough, abd/back pain, N/V or bleeding; does have some rt first toe pain and occ left side chest discomfort  Vital Signs: BP 119/72 (BP Location: Right Arm)   Pulse (!) 125   Temp 98 F (36.7 C) (Oral)   Resp 18   Ht '5\' 9"'$  (1.753 m)   Wt 298 lb (135.2 kg)   LMP 11/23/2015 (Approximate)   SpO2 99%   BMI 44.01 kg/m   Physical Exam awake/alert; chest- sl more distant BS rt side, left clear; heart- tachy but regular; abd- obese, soft,+BS,NT; ext- no sig edema; fullness rt lower neck/[place] region,NT  Mallampati Score:     Imaging: Dg Chest 2 View  Result Date: 12/25/2015 CLINICAL DATA:  Generalize chronic chest pain, shortness of breath, and cough. History of asthma, diabetes, and hypertension. EXAM: CHEST  2 VIEW COMPARISON:  09/15/2015 FINDINGS: Right hilar and right peritracheal lymphadenopathy is demonstrated similar to prior study. Lungs are clear and expanded. No focal airspace disease or consolidation. No blunting of costophrenic angles. No pneumothorax. Normal heart size and  pulmonary vascularity. Mild degenerative changes in the spine. IMPRESSION: Right hilar and right paratracheal lymphadenopathy is demonstrated, similar to prior study. No evidence of active pulmonary disease. Electronically Signed   By: Lucienne Capers M.D.   On: 12/25/2015 21:23   Ct Soft Tissue Neck W Contrast  Result Date: 12/27/2015 CLINICAL DATA:  Sore throat with right-sided swelling. Mediastinal mass on chest CT. EXAM: CT NECK WITH CONTRAST TECHNIQUE: Multidetector CT imaging of the neck was performed using the standard protocol following the bolus administration of intravenous  contrast. CONTRAST:  157m ISOVUE-300 IOPAMIDOL (ISOVUE-300) INJECTION 61% COMPARISON:  CTA neck 05/30/2015 FINDINGS: Pharynx and larynx: No pharyngeal mass or inflammatory change. Unremarkable larynx. Salivary glands: Submandibular and parotid glands are unremarkable. Thyroid: Unremarkable. Lymph nodes: There are bulky right level IV and supraclavicular lymph nodes which are new from the prior CTA. These measure up to at least 2 cm in short axis, with evaluation mildly limited by beam hardening. Enlarged right level V lymph nodes are also new and measure up to 1.5 cm in short axis. Subcentimeter short axis lymph nodes in the right neck more superiorly and scattered throughout the left neck (including an 8 mm short axis left level IV lymph node) are all similar to the prior CTA. Vascular: Major vascular structures of the neck appear patent. 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 Limited intracranial: Unremarkable. Visualized orbits: Not imaged. Mastoids and visualized paranasal sinuses: Clear. Skeleton: Mild lower cervical spondylosis. No destructive osseous lesion identified. subcentimeter sclerotic focus in the base of the C2 spinous processes unchanged, possibly a bone island. Upper chest: Bulky mediastinal lymphadenopathy, more fully evaluated on yesterday's chest CT. Other: None. IMPRESSION: 1. Unremarkable appearance of the pharynx. No evidence of acute process to explain sore throat. 2. Bulky lymphadenopathy in the right lower neck and supraclavicular regions, consistent with metastatic disease. Electronically Signed   By: ALogan BoresM.D.   On: 12/27/2015 14:08   Ct Angio Chest Pe W Or Wo Contrast  Result Date: 12/26/2015 CLINICAL DATA:  Acute onset of generalized chest pain, tachycardia, high blood pressure and shortness of breath. Initial encounter. EXAM: CT ANGIOGRAPHY CHEST WITH CONTRAST TECHNIQUE: Multidetector CT imaging of the chest was performed using the  standard protocol during bolus administration of intravenous contrast. Multiplanar CT image reconstructions and MIPs were obtained to evaluate the vascular anatomy. CONTRAST:  100 mL of Isovue 370 IV contrast COMPARISON:  Chest radiograph performed 12/25/2015, and CTA of the chest performed 05/30/2015 FINDINGS: Cardiovascular: There is no evidence of pulmonary embolus. There is mild mass effect on vasculature from the patient's mediastinal and right hilar masses. The thoracic aorta remains grossly intact. The great vessels are grossly unremarkable in appearance. Mediastinum/Nodes: There is a large 7.2 x 6.0 x 4.8 cm mass at the right paratracheal region, markedly enlarged from the prior study, partially encasing the right side of the trachea and the right mainstem bronchus, highly suspicious for small cell lung cancer. There is mild mass effect on the right side of the trachea. Right hilar and azygoesophageal recess nodes measure up to 2.0 cm in short axis, concerning for metastatic disease. No pericardial effusion is identified. Scattered prominent anterior mediastinal nodes are seen, measuring up to 1.7 cm in short axis. Lungs/Pleura: Bibasilar atelectasis is noted. No pleural effusion or pneumothorax is seen. No pulmonary nodules are identified. Upper Abdomen: The visualized portions of the liver and spleen are grossly unremarkable. Musculoskeletal: No acute osseous abnormalities are identified. The  visualized musculature is unremarkable in appearance. Review of the MIP images confirms the above findings. IMPRESSION: 1. No evidence of pulmonary embolus. 2. 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 Mild mass-effect on the right side of the trachea. 3. Right hilar and azygoesophageal recess nodes measure up to 2.0 cm in short axis.  Prominent anterior mediastinal nodes measure up to 1.7 cm in short axis. These are concerning for metastatic disease. 4. Bibasilar atelectasis noted. These results were called by telephone at the time of interpretation on 12/26/2015 at 5:37 am to [facility], who verbally acknowledged these results. Electronically Signed   By: Garald Balding M.D.   On: 12/26/2015 05:40   Mr Jeri Cos YJ Contrast  Result Date: 12/26/2015 CLINICAL DATA:  Lung cancer. Headache. Assess for metastatic disease. EXAM: MRI HEAD WITHOUT AND WITH CONTRAST TECHNIQUE: Multiplanar, multiecho pulse sequences of the brain and surrounding structures were obtained without and with intravenous contrast. CONTRAST:  41m MULTIHANCE GADOBENATE DIMEGLUMINE 529 MG/ML IV SOLN COMPARISON:  CT 05/30/2015 FINDINGS: Brain: The brain has normal appearance without evidence of malformation, atrophy, old or acute small or large vessel infarction, hemorrhage, hydrocephalus or extra-axial collection. No pituitary abnormality. After contrast administration, no abnormal enhancement occurs. Vascular: Major vessels at the base of the brain show flow. Skull and upper cervical spine: Normal Sinuses/Orbits: Clear/ normal. Other: None significant. IMPRESSION: Normal examination. No evidence of metastatic disease. No other finding to explain headache. Electronically Signed   By: MNelson ChimesM.D.   On: 12/26/2015 11:27   Ct Abdomen Pelvis W Contrast  Addendum Date: 12/27/2015   ADDENDUM REPORT: 12/27/2015 14:12 ADDENDUM: There is questionable sludge within the gallbladder. It may be prudent to consider ultrasound of the right upper quadrant given questionable sludge in the gallbladder as well as prominence of the common bile duct. Electronically Signed   By: WLowella GripIII M.D.   On: 12/27/2015 14:12   Result Date: 12/27/2015 CLINICAL DATA:  Chest adenopathy with concern for metastatic focus EXAM: CT ABDOMEN AND PELVIS WITH CONTRAST TECHNIQUE:  Multidetector CT imaging of the abdomen and pelvis was performed using the standard protocol following bolus administration of intravenous contrast. Oral contrast was also administered. CONTRAST:  1066mISOVUE-300 IOPAMIDOL (ISOVUE-300) INJECTION 61% COMPARISON:  CT abdomen and pelvis May 18, 2010; chest CT December 26, 2015 FINDINGS: Lower chest: Focal opacity in the posterior aspect of the superior segment right lower lobe is increased from 1 day prior and is consistent with either increased atelectasis or new focus infiltrate in this area. Lung bases otherwise are clear. Hepatobiliary: No focal liver lesions are evident. There is no appreciable gallbladder wall thickening. There is prominence of the common bile duct 10 mm without mass or calculus evident in the common bile duct on this study. Pancreas: There is no pancreatic mass or fluid collection. Spleen: No splenic lesions are evident. Adrenals/Urinary Tract: There is a 1.1 x 0.9 cm mass in the left adrenal. Right adrenal appears normal. Kidneys bilaterally show no demonstrable mass or hydronephrosis on either side. There is no renal or ureteral calculus on either side. Urinary bladder is midline with wall thickness within normal limits. Stomach/Bowel: There is fairly diffuse stool throughout colon. There is no bowel wall or mesenteric thickening. No bowel obstruction. No free air or portal venous air. Vascular/Lymphatic:  There is no abdominal aortic aneurysm. No vascular lesions are evident. There are a few scattered subcentimeter lymph nodes in the mesentery. By size criteria, there is no adenopathy in the abdomen or pelvis. Reproductive: Uterus is retroverted. There is no pelvic mass or pelvic fluid collection. Other: Appendix appears unremarkable. There is no ascites or abscess in the abdomen or pelvis. There is a small ventral hernia containing only fat. No omental lesions are demonstrable. Musculoskeletal: There are foci of degenerative change in  the lumbar spine. There are no blastic or lytic bone lesions. There is no intramuscular or abdominal wall lesion evident. IMPRESSION: Small mass in left adrenal. A small metastasis must be of concern in this region given the findings on recent chest CT. No adenopathy is apparent in the abdomen or pelvis. There are scattered subcentimeter mesenteric lymph nodes which do not meet size criteria for pathologic significance. No liver lesions are evident. No inflammatory foci are appreciated. No bowel obstruction. No abscess. No renal or ureteral calculus. No hydronephrosis. There is a small ventral hernia containing only fat. There is prominence of the common bile duct without mass or calculus evident. Significance of this finding is uncertain. Electronically Signed: By: Lowella Grip III M.D. On: 12/27/2015 14:08    Labs:  CBC:  Recent Labs  09/15/15 1645 12/25/15 2016 12/27/15 0156 12/28/15 0214  WBC 8.2 8.9 7.6 7.6  HGB 10.5* 10.3* 9.7* 9.9*  HCT 33.5* 33.7* 31.5* 31.9*  PLT 372 377 337 361    COAGS:  Recent Labs  12/28/15 0214  INR 1.08    BMP:  Recent Labs  09/15/15 1645 12/25/15 2016 12/27/15 0156 12/28/15 0214  NA 138 134* 139 138  K 3.7 3.7 3.8 3.8  CL 103 99* 104 104  CO2 '27 26 26 27  '$ GLUCOSE 216* 352* 258* 256*  BUN '7 7 6 6  '$ CALCIUM 9.3 9.2 8.7* 8.9  CREATININE 0.52 0.71 0.58 0.58  GFRNONAA >60 >60 >60 >60  GFRAA >60 >60 >60 >60    LIVER FUNCTION TESTS:  Recent Labs  05/23/15 2025 12/28/15 0214  BILITOT 0.1* 0.2*  AST 18 14*  ALT 15 12*  ALKPHOS 78 77  PROT 7.0 7.1  ALBUMIN 3.1* 3.1*    TUMOR MARKERS: No results for input(s): AFPTM, CEA, CA199, CHROMGRNA in the last 8760 hours.  Assessment and Plan: 50 y.o. female with history as listed below who presented to [facility] on 12/26/15 with chest pain, dyspnea and cough. Symptoms have been persistent for past 3-4 months. Imaging revealed large rt paratracheal mass partially encasing the right side of the  trachea and right mainstem bronchus, highly suspicious for small cell lung cancer. There was mild mass-effect on the right side of the trachea as well as bulky adenopathy in rt lower neck/[place] regions. No PE was noted. Also noted was small left adrenal mass. Brain MRI was negative. EKG showed sinus tach, troponins mildly elevated, TSH nl. Request received from oncology for image guided biopsy of most accessible site. Imaging studies were reviewed by Dr. Vernard Gambles. At this time rt [place] LN biopsy is safest approach . Risks and benefits discussed with the patient/mother including, but not limited to bleeding, infection, damage to adjacent structures or low yield requiring additional tests.All of the patient's questions were answered, patient is agreeable to proceed.Consent signed and in chart. Procedure tent planned for 9/25 if cardiac status stable. Consider cardiology input due to mild troponin bump, persistent tachycardia, left chest discomfort.  Thank you for this interesting consult.  I greatly enjoyed meeting Marilyn [name] and look forward to participating in their care.  A copy of this report was sent to the requesting provider on this date.  Electronically Signed: D. Rowe Robert 12/28/2015, 9:39 AM   I spent a total of 30 minutes    in face to face in clinical consultation, greater than 50% of which was counseling/coordinating care for US guided right supraclavicular lymph node biopsy

## 2015-12-28 NOTE — Progress Notes (Addendum)
Oncology followup  DOS 12/28/2015  CT neck and CT abd reviewed 12/27/2015: IMPRESSION: 1. Unremarkable appearance of the pharynx. No evidence of acute process to explain sore throat. 2. Bulky lymphadenopathy in the right lower neck and supraclavicular regions, consistent with metastatic disease.   Electronically Signed   By: Logan Bores M.D.   On: 12/27/2015 14:08   Plan -would recommend US guided core needle biopsy of Rt lower neck/supraclavicular lymph node. FNA would be inadequate given that lymphoma is on the differential. -would strongly suspect Hodgkins lymphoma though differential is open currently -appreciate excellent care by hospitalist team and input from PCCM. Case was discussed yesterday by Dr Chase Caller. -we shall f/u with biopsy results  Sullivan Lone MD MS

## 2015-12-28 NOTE — Progress Notes (Addendum)
Patient remains NSR to ST with periods of tachycardia to 130-140's with activity.  Patient states she can not tell when heart rate is elevated.  Patient stated she is supposed to be on several medications for her blood pressure and "other stuff" but she only takes her diabetes medication due to financial issues. I did not see a notation for this in the progress notes. Patient off floor at this time for abdominal ultra sound. Will continue to monitor. Payton Emerald, RN

## 2015-12-29 ENCOUNTER — Inpatient Hospital Stay (HOSPITAL_COMMUNITY): Payer: BC Managed Care – PPO

## 2015-12-29 DIAGNOSIS — R000 Tachycardia, unspecified: Secondary | ICD-10-CM

## 2015-12-29 LAB — CBC WITH DIFFERENTIAL/PLATELET
BASOS PCT: 0 %
Basophils Absolute: 0 10*3/uL (ref 0.0–0.1)
EOS ABS: 0.1 10*3/uL (ref 0.0–0.7)
EOS PCT: 2 %
HCT: 33.4 % — ABNORMAL LOW (ref 36.0–46.0)
HEMOGLOBIN: 10.4 g/dL — AB (ref 12.0–15.0)
Lymphocytes Relative: 33 %
Lymphs Abs: 2.6 10*3/uL (ref 0.7–4.0)
MCH: 26.1 pg (ref 26.0–34.0)
MCHC: 31.1 g/dL (ref 30.0–36.0)
MCV: 83.9 fL (ref 78.0–100.0)
MONOS PCT: 5 %
Monocytes Absolute: 0.4 10*3/uL (ref 0.1–1.0)
NEUTROS PCT: 60 %
Neutro Abs: 4.9 10*3/uL (ref 1.7–7.7)
PLATELETS: 346 10*3/uL (ref 150–400)
RBC: 3.98 MIL/uL (ref 3.87–5.11)
RDW: 13 % (ref 11.5–15.5)
WBC: 8 10*3/uL (ref 4.0–10.5)

## 2015-12-29 LAB — BASIC METABOLIC PANEL
ANION GAP: 6 (ref 5–15)
CHLORIDE: 104 mmol/L (ref 101–111)
CO2: 26 mmol/L (ref 22–32)
Calcium: 8.7 mg/dL — ABNORMAL LOW (ref 8.9–10.3)
Creatinine, Ser: 0.55 mg/dL (ref 0.44–1.00)
GFR calc Af Amer: >60 mL/min (ref >60)
Glucose, Bld: 214 mg/dL — ABNORMAL HIGH (ref 65–99)
POTASSIUM: 3.8 mmol/L (ref 3.5–5.1)
SODIUM: 136 mmol/L (ref 135–145)

## 2015-12-29 LAB — HEMOGLOBIN A1C
Hgb A1c MFr Bld: 9.2 % — ABNORMAL HIGH (ref 4.8–5.6)
Mean Plasma Glucose: 217 mg/dL

## 2015-12-29 LAB — GLUCOSE, CAPILLARY
GLUCOSE-CAPILLARY: 205 mg/dL — AB (ref 65–99)
GLUCOSE-CAPILLARY: 250 mg/dL — AB (ref 65–99)
GLUCOSE-CAPILLARY: 358 mg/dL — AB (ref 65–99)
Glucose-Capillary: 293 mg/dL — ABNORMAL HIGH (ref 65–99)

## 2015-12-29 LAB — ANGIOTENSIN CONVERTING ENZYME: ANGIOTENSIN-CONVERTING ENZYME: 42 U/L (ref 14–82)

## 2015-12-29 LAB — URIC ACID: URIC ACID, SERUM: 4.8 mg/dL (ref 2.3–6.6)

## 2015-12-29 MED ORDER — FENTANYL CITRATE (PF) 100 MCG/2ML IJ SOLN
INTRAMUSCULAR | Status: AC
  Filled 2015-12-29: qty 2

## 2015-12-29 MED ORDER — MIDAZOLAM HCL 2 MG/2ML IJ SOLN
INTRAMUSCULAR | Status: AC | PRN
  Administered 2015-12-29: 2 mg via INTRAVENOUS

## 2015-12-29 MED ORDER — SENNOSIDES-DOCUSATE SODIUM 8.6-50 MG PO TABS
2.0000 | ORAL_TABLET | Freq: Two times a day (BID) | ORAL | Status: DC
  Administered 2015-12-29 – 2015-12-30 (×2): 2 via ORAL
  Filled 2015-12-29 (×3): qty 2

## 2015-12-29 MED ORDER — INSULIN ASPART 100 UNIT/ML ~~LOC~~ SOLN: 0.0000 [IU] | Freq: Every day | SUBCUTANEOUS | Status: DC

## 2015-12-29 MED ORDER — FENTANYL CITRATE (PF) 100 MCG/2ML IJ SOLN
INTRAMUSCULAR | Status: AC | PRN
  Administered 2015-12-29: 100 ug via INTRAVENOUS

## 2015-12-29 MED ORDER — MIDAZOLAM HCL 2 MG/2ML IJ SOLN
INTRAMUSCULAR | Status: AC
  Filled 2015-12-29: qty 2

## 2015-12-29 MED ORDER — INSULIN ASPART 100 UNIT/ML ~~LOC~~ SOLN
0.0000 [IU] | Freq: Three times a day (TID) | SUBCUTANEOUS | Status: DC
  Administered 2015-12-30: 11 [IU] via SUBCUTANEOUS
  Administered 2015-12-30: 8 [IU] via SUBCUTANEOUS

## 2015-12-29 MED ORDER — INSULIN GLARGINE 100 UNIT/ML ~~LOC~~ SOLN
20.0000 [IU] | Freq: Every day | SUBCUTANEOUS | Status: DC
  Administered 2015-12-30: 20 [IU] via SUBCUTANEOUS
  Filled 2015-12-29: qty 0.2

## 2015-12-29 MED ORDER — LIDOCAINE HCL 1 % IJ SOLN
INTRAMUSCULAR | Status: AC
  Filled 2015-12-29: qty 20

## 2015-12-29 MED ORDER — METOPROLOL TARTRATE 25 MG PO TABS
25.0000 mg | ORAL_TABLET | Freq: Two times a day (BID) | ORAL | Status: DC
  Administered 2015-12-29 – 2015-12-30 (×2): 25 mg via ORAL
  Filled 2015-12-29 (×2): qty 1

## 2015-12-29 NOTE — Procedures (Signed)
Interventional Radiology Procedure Note  Procedure: US guided core biopsy RIGHT supraclavicular nodes.   Complications: None  Estimated Blood Loss: 0  Recommendations: - Path pending - Bedrest x 1hr  Signed,  Criselda Peaches, MD

## 2015-12-29 NOTE — Progress Notes (Signed)
Inpatient Diabetes Program Recommendations  AACE/ADA: New Consensus Statement on Inpatient Glycemic Control (2015)  Target Ranges:  Prepandial:   less than 140 mg/dL      Peak postprandial:   less than 180 mg/dL (1-2 hours)      Critically ill patients:  140 - 180 mg/dL   Lab Results  Component Value Date   GLUCAP 205 (H) 12/29/2015   Results for Marilyn, [name] (MRN [id number]) as of 12/29/2015 10:06  Ref. Range 12/28/2015 06:23 12/28/2015 11:12 12/28/2015 16:31 12/28/2015 21:30 12/29/2015 06:06  Glucose-Capillary Latest Ref Range: 65 - 99 mg/dL 219 (H) 222 (H) 290 (H) 279 (H) 205 (H)   Review of Glycemic Control  Blood sugars continue > 180 mg/dL. Needs insulin titration.  Inpatient Diabetes Program Recommendations:    Increase Lantus to 22 units QAM Increase Novolog to moderate tidwc and hs.  Will continue to follow.  Thank you. Lorenda Peck, RD, LDN, CDE Inpatient Diabetes Coordinator [phone]

## 2015-12-29 NOTE — Sedation Documentation (Signed)
Patient is resting comfortably. 

## 2015-12-29 NOTE — Progress Notes (Signed)
PROGRESS NOTE  RICCI SHONG QMV:[id number] DOB: [birth year] DOA: 12/26/2015 PCP: Dorrene German, MD  HPI/Recap of past 24 hours:  Returned from biopsy, currently drowsy,  Persistent sinus tachycardia, bp stable, report chest pressure unchanged, no hypoxia Report daily am headache right great toe pain reported on 9/24 has resolved  Mother in room  Assessment/Plan: Active Problems:   Chest pain   Lung mass   Headache   Diabetes mellitus (HCC)   Mediastinal mass  Lung mass with chest pain - This is highly suspicious of malignancy,  -mri brain unremarkable, ct soft tissue neck "Bulky lymphadenopathy in the right lower neck and supraclavicular regions, consistent with metastatic disease" Ct abdomen as described below. -I dicussed the case with interventional radiology Dr Deanne Coffer who recommend biopsy of supraclavicular nodes on Monday with pathology in house. pulmonology and oncology also following, input appreciated.  Insulin dependent Diabetes mellitus - a1c 9.2 -Hold oral hypoglycemic agent, on insulin , adjust prn  Headache - MRI brain unremarkable Report daily morning headache for the last few months, will need outpatient sleep study  Right toe pain, no significant erythema or edema,  uric acid  Wnl, foot x ray unremarkable, pain has resolved  Morbid obesity: Body mass index is 44.01 kg/m.   Sinus tachycardia: tsh 1.5, started on lopressor, titrate up if bp allows, echo cardiogram  DVT Prophylaxis SCDs, no chemical DVT prophylaxis pending bronchoscopy     Code Status: full   Family Communication: patient and her mother  Disposition Plan: home in a few days if clears by oncology   Consultants:  Pulmonology  Oncology  IR  Procedures:  none  Antibiotics:  none   Objective: BP 131/75   Pulse (!) 115   Temp 98.6 F (37 C) (Oral)   Resp 13   Ht 5\' 9"  (1.753 m)   Wt 135.2 kg (298 lb)   LMP 11/23/2015 (Approximate)   SpO2 100%   BMI  44.01 kg/m   Intake/Output Summary (Last 24 hours) at 12/29/15 1256 Last data filed at 12/29/15 1032  Gross per 24 hour  Intake                3 ml  Output                0 ml  Net                3 ml   Filed Weights   12/25/15 2020  Weight: 135.2 kg (298 lb)    Exam:   General:  drowsy, obese  Cardiovascular: sinus tachycardia  Respiratory: CTABL  Abdomen: Soft/ND/NT, positive BS  Musculoskeletal: No Edema  Neuro: aaox3  Data Reviewed: Basic Metabolic Panel:  Recent Labs Lab 12/25/15 2016 12/27/15 0156 12/28/15 0214 12/29/15 0435  NA 134* 139 138 136  K 3.7 3.8 3.8 3.8  CL 99* 104 104 104  CO2 26 26 27 26   GLUCOSE 352* 258* 256* 214*  BUN 7 6 6  <5*  CREATININE 0.71 0.58 0.58 0.55  CALCIUM 9.2 8.7* 8.9 8.7*   Liver Function Tests:  Recent Labs Lab 12/28/15 0214  AST 14*  ALT 12*  ALKPHOS 77  BILITOT 0.2*  PROT 7.1  ALBUMIN 3.1*   No results for input(s): LIPASE, AMYLASE in the last 168 hours. No results for input(s): AMMONIA in the last 168 hours. CBC:  Recent Labs Lab 12/25/15 2016 12/27/15 0156 12/28/15 0214 12/29/15 0435  WBC 8.9 7.6 7.6 8.0  NEUTROABS  --   --   --  4.9  HGB 10.3* 9.7* 9.9* 10.4*  HCT 33.7* 31.5* 31.9* 33.4*  MCV 84.0 84.5 83.9 83.9  PLT 377 337 361 346   Cardiac Enzymes:    Recent Labs Lab 12/26/15 1006 12/26/15 1541 12/26/15 2035  TROPONINI 0.04* 0.03* <0.03   BNP (last 3 results)  Recent Labs  12/25/15 2016  BNP 20.8    ProBNP (last 3 results) No results for input(s): PROBNP in the last 8760 hours.  CBG:  Recent Labs Lab 12/28/15 1112 12/28/15 1631 12/28/15 2130 12/29/15 0606 12/29/15 1126  GLUCAP 222* 290* 279* 205* 250*    Recent Results (from the past 240 hour(s))  Surgical pcr screen     Status: None   Collection Time: 12/28/15 12:14 AM  Result Value Ref Range Status   MRSA, PCR NEGATIVE NEGATIVE Final   Staphylococcus aureus NEGATIVE NEGATIVE Final    Comment:        The  Xpert SA Assay (FDA approved for NASAL specimens in patients over 48 years of age), is one component of a comprehensive surveillance program.  Test performance has been validated by [facility] for patients greater than or equal to 31 year old. It is not intended to diagnose infection nor to guide or monitor treatment.      Studies: Dg Foot 2 Views Right  Result Date: 12/28/2015 CLINICAL DATA:  50 year old female with a history of right great toe pain EXAM: RIGHT FOOT - 2 VIEW COMPARISON:  None. FINDINGS: No displaced fracture identified.  No radiopaque foreign body. Degenerative changes of the interphalangeal joints. Minimal degenerative changes of the metatarsal-phalangeal joints. Degenerative changes of the midfoot and hindfoot. No focal soft tissue swelling. IMPRESSION: Negative for acute bony abnormality. Signed, Yvone Neu. Loreta Ave, DO [facility] Radiology Electronically Signed   By: Gilmer Mor D.O.   On: 12/28/2015 16:51    Scheduled Meds: . fentaNYL      . insulin aspart  0-5 Units Subcutaneous QHS  . insulin aspart  0-9 Units Subcutaneous TID WC  . insulin glargine  15 Units Subcutaneous Daily  . lidocaine      . metoprolol tartrate  25 mg Oral BID  . midazolam      . sodium chloride flush  3 mL Intravenous Q12H    Continuous Infusions:     Time spent:  Shukri Nistler MD, PhD  [facility] Pager [phone]. If 7PM-7AM, please contact night-coverage at [email or web address], password [facility] 12/29/2015, 12:56 PM  LOS: 1 day

## 2015-12-29 NOTE — Sedation Documentation (Signed)
Patient denies pain and is resting comfortably.  

## 2015-12-30 ENCOUNTER — Inpatient Hospital Stay (HOSPITAL_COMMUNITY): Payer: BC Managed Care – PPO

## 2015-12-30 DIAGNOSIS — R000 Tachycardia, unspecified: Secondary | ICD-10-CM

## 2015-12-30 LAB — BASIC METABOLIC PANEL
ANION GAP: 8 (ref 5–15)
BUN: 7 mg/dL (ref 6–20)
CHLORIDE: 103 mmol/L (ref 101–111)
CO2: 26 mmol/L (ref 22–32)
Calcium: 9.1 mg/dL (ref 8.9–10.3)
Creatinine, Ser: 0.55 mg/dL (ref 0.44–1.00)
GFR calc non Af Amer: >60 mL/min (ref >60)
Glucose, Bld: 291 mg/dL — ABNORMAL HIGH (ref 65–99)
POTASSIUM: 3.7 mmol/L (ref 3.5–5.1)
Sodium: 137 mmol/L (ref 135–145)

## 2015-12-30 LAB — GLUCOSE, CAPILLARY
GLUCOSE-CAPILLARY: 325 mg/dL — AB (ref 65–99)
GLUCOSE-CAPILLARY: 313 mg/dL — AB (ref 65–99)
Glucose-Capillary: 299 mg/dL — ABNORMAL HIGH (ref 65–99)

## 2015-12-30 LAB — ECHOCARDIOGRAM COMPLETE

## 2015-12-30 LAB — MAGNESIUM: Magnesium: 1.5 mg/dL — ABNORMAL LOW (ref 1.7–2.4)

## 2015-12-30 MED ORDER — MAGNESIUM SULFATE 4 GM/100ML IV SOLN
4.0000 g | Freq: Once | INTRAVENOUS | Status: DC
Start: 2015-12-30 — End: 2015-12-30
  Filled 2015-12-30: qty 100

## 2015-12-30 MED ORDER — METOPROLOL TARTRATE 25 MG PO TABS: 25.0000 mg | ORAL_TABLET | Freq: Two times a day (BID) | ORAL | 0 refills | Status: DC

## 2015-12-30 NOTE — Progress Notes (Signed)
Discharged patient from 2west room 5. Went over AVS with patient and mother. Explained follow up appointments and to call her primary care to make that appointment. Explained her prescription was at [facility] for pickup. IV removed from left forearm. CCMD called for tele d/c.  Cyndia Bent

## 2015-12-30 NOTE — Discharge Summary (Signed)
Discharge Summary  [name]:[id number] DOB: 1966/01/26  PCP: Philis Fendt, MD  Admit date: 12/26/2015 Discharge date: 12/30/2015  Time spent: <86mns  Recommendations for Outpatient Follow-up:  1. F/u with PMD within a week  for hospital discharge follow up, pmd to continue monitor heart rate and blood sugar control and adjust insulin, patient is in the process of establish care with another pmd , she has an appointment on 9/27. She will need to have outpatient sleep study to r/o OSA 2. F/u with oncology Dr KIrene Limbo3. F/u with radiation oncology  Discharge Diagnoses:  Active Hospital Problems   Diagnosis Date Noted  . Mediastinal mass   . Chest pain 12/26/2015  . Lung mass 12/26/2015  . Headache 12/26/2015  . Diabetes mellitus ([place] 12/26/2015    Resolved Hospital Problems   Diagnosis Date Noted Date Resolved  No resolved problems to display.    Discharge Condition: stable, ambulating in room ,talking over the phone  Diet recommendation: heart healthy/carb modified  Filed Weights   12/25/15 2020  Weight: 135.2 kg (298 lb)    History of present illness:  EJakia [name] is a 50y.o. female, Asked medical history of Asthma, insulin-dependent diabetes, high cholesterol, hypertension, obesity, presents to ED with complaints of chest pain, right-sided, intermittent, progressive over last few month, accompanied by shortness of breath, she denies any weight loss, any cough, any smoking history, any secondhand smoking, CT chest with contrast significant for large paratracheal mass, surrounding trachea and right bronchus, patient tachycardic on admission, hospitalist was called to admit. In ED workup significant for tachycardia, no hypoxia, no significant labs abnormalities, CT chest with contrast with no evidence of PE, but significant for large right paratracheal mass encasing right side of trachea and right mainstem bronchus  Hospital Course:  Active Problems:   Chest  pain   Lung mass   Headache   Diabetes mellitus ([place]   Mediastinal mass   Lung mass with chest pain -mri brain unremarkable, ct soft tissue neck "Bulky lymphadenopathy in the right lower neck and supraclavicular regions, consistent with metastatic disease" Ct abdomen as described below. -u[place]ore[place]ided biopsy of supraclavicular nodes on 9/24, per oncology Dr kIrene Limbo, path showed [facility] molecular study underway. I have discussed the preliminary result with the patient. -Dr kIrene Limbowill arrange outpatient oncology and radiation oncology follow up  Insulin dependent Diabetes mellitus - a1c 9.2 -continue tresiba ( patient report she has been on this for a year), while in the hospital, she did not need as much insulin as she had at home, patient is made aware and she know to close monitor her blood sugar -victoza discontinued in the setting of active cancer  Headache - MRI brain unremarkable Report daily morning headache for the last few months, will need outpatient sleep study  Right toe pain, no significant erythema or edema,  uric acid  Wnl, foot x ray unremarkable, pain has resolved  Morbid obesity: Body mass index is 44.01 kg/m.   Sinus tachycardia: tsh 1.5, started on lopressor, titrate up if bp allows, echo cardiogram  ? Dyspahgia? She report she cough when she eats, she also has to turn her head to the left when eating, MBs unremarkable, she is cleared to be on regular diet and thin liquid,  Symptom from tumor external compression?   Code Status: full   Family Communication: patient and her mother  Disposition Plan: home on 9/26 with oncology clearance   Consultants:  Pulmonology  Oncology  IR  Phone conversation with radiation oncology PA Loanne Drilling  Procedures:  US guided supraclavicular nodes  Antibiotics:  none   Discharge Exam: BP 140/85 (BP Location: Right Arm)   Pulse (!) 115   Temp 98.2 F (36.8 C) (Oral)   Resp 18   Ht '5\' 9"'$  (1.753 m)    Wt 135.2 kg (298 lb)   LMP 11/23/2015 (Approximate)   SpO2 98%   BMI 44.01 kg/m     General:  NAD, obese  Cardiovascular: less sinus tachycardia  Respiratory: CTABL  Abdomen: Soft/ND/NT, positive BS  Musculoskeletal: No Edema  Neuro: aaox3   Discharge Instructions You were cared for by a hospitalist during your hospital stay. If you have any questions about your discharge medications or the care you received while you were in the hospital after you are discharged, you can call the unit and asked to speak with the hospitalist on call if the hospitalist that took care of you is not available. Once you are discharged, your primary care physician will handle any further medical issues. Please note that NO REFILLS for any discharge medications will be authorized once you are discharged, as it is imperative that you return to your primary care physician (or establish a relationship with a primary care physician if you do not have one) for your aftercare needs so that they can reassess your need for medications and monitor your lab values.  Discharge Instructions    Diet - low sodium heart healthy    Complete by:  As directed    Low fat and carb modified   Increase activity slowly    Complete by:  As directed        Medication List    STOP taking these medications   cyclobenzaprine 10 MG tablet Commonly known as:  FLEXERIL   famotidine 20 MG tablet Commonly known as:  PEPCID   HYDROcodone-acetaminophen 7.5-325 mg/15 ml solution Commonly known as:  HYCET   ibuprofen 600 MG tablet Commonly known as:  ADVIL,MOTRIN   sitaGLIPtin-metformin 50-1000 MG tablet Commonly known as:  JANUMET   VICTOZA 18 MG/3ML Sopn Generic drug:  Liraglutide     TAKE these medications   metoprolol tartrate 25 MG tablet Commonly known as:  LOPRESSOR Take 1 tablet (25 mg total) by mouth 2 (two) times daily.   TRESIBA FLEXTOUCH 200 UNIT/ML Sopn Generic drug:  Insulin Degludec Inject 100  Units into the skin daily before breakfast.      No Known Allergies Follow-up Bennet, MD Follow up in 1 week(s).   Specialties:  Hematology, Oncology Contact information: [address] [address] [place] [facility] Follow up in 1 week(s).   Specialty:  Radiation Oncology Contact information: [place] [id number] [place] [address] [phone]           The results of significant diagnostics from this hospitalization (including imaging, microbiology, ancillary and laboratory) are listed below for reference.    Significant Diagnostic Studies: Dg Chest 2 View  Result Date: 12/25/2015 CLINICAL DATA:  Generalize chronic chest pain, shortness of breath, and cough. History of asthma, diabetes, and hypertension. EXAM: CHEST  2 VIEW COMPARISON:  09/15/2015 FINDINGS: Right hilar and right peritracheal lymphadenopathy is demonstrated similar to prior study. Lungs are clear and expanded. No focal airspace disease or consolidation. No blunting of costophrenic angles. No pneumothorax. Normal heart size and pulmonary vascularity. Mild degenerative  changes in the spine. IMPRESSION: Right hilar and right paratracheal lymphadenopathy is demonstrated, similar to prior study. No evidence of active pulmonary disease. Electronically Signed   By: Lucienne Capers M.D.   On: 12/25/2015 21:23   Ct Soft Tissue Neck W Contrast  Result Date: 12/27/2015 CLINICAL DATA:  Sore throat with right-sided swelling. Mediastinal mass on chest CT. EXAM: CT NECK WITH CONTRAST TECHNIQUE: Multidetector CT imaging of the neck was performed using the standard protocol following the bolus administration of intravenous contrast. CONTRAST:  139m ISOVUE-300 IOPAMIDOL (ISOVUE-300) INJECTION 61% COMPARISON:  CTA neck 05/30/2015 FINDINGS: Pharynx and larynx: No pharyngeal mass or inflammatory change. Unremarkable larynx.  Salivary glands: Submandibular and parotid glands are unremarkable. Thyroid: Unremarkable. Lymph nodes: There are bulky right level IV and supraclavicular lymph nodes which are new from the prior CTA. These measure up to at least 2 cm in short axis, with evaluation mildly limited by beam hardening. Enlarged right level V lymph nodes are also new and measure up to 1.5 cm in short axis. Subcentimeter short axis lymph nodes in the right neck more superiorly and scattered throughout the left neck (including an 8 mm short axis left level IV lymph node) are all similar to the prior CTA. Vascular: Major vascular structures of the neck appear patent. 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 Limited intracranial: Unremarkable. Visualized orbits: Not imaged. Mastoids and visualized paranasal sinuses: Clear. Skeleton: Mild lower cervical spondylosis. No destructive osseous lesion identified. subcentimeter sclerotic focus in the base of the C2 spinous processes unchanged, possibly a bone island. Upper chest: Bulky mediastinal lymphadenopathy, more fully evaluated on yesterday's chest CT. Other: None. IMPRESSION: 1. Unremarkable appearance of the pharynx. No evidence of acute process to explain sore throat. 2. Bulky lymphadenopathy in the right lower neck and supraclavicular regions, consistent with metastatic disease. Electronically Signed   By: ALogan BoresM.D.   On: 12/27/2015 14:08   Ct Angio Chest Pe W Or Wo Contrast  Result Date: 12/26/2015 CLINICAL DATA:  Acute onset of generalized chest pain, tachycardia, high blood pressure and shortness of breath. Initial encounter. EXAM: CT ANGIOGRAPHY CHEST WITH CONTRAST TECHNIQUE: Multidetector CT imaging of the chest was performed using the standard protocol during bolus administration of intravenous contrast. Multiplanar CT image reconstructions and MIPs were obtained to evaluate the vascular anatomy. CONTRAST:  100 mL of Isovue 370 IV  contrast COMPARISON:  Chest radiograph performed 12/25/2015, and CTA of the chest performed 05/30/2015 FINDINGS: Cardiovascular: There is no evidence of pulmonary embolus. There is mild mass effect on vasculature from the patient's mediastinal and right hilar masses. The thoracic aorta remains grossly intact. The great vessels are grossly unremarkable in appearance. Mediastinum/Nodes: There is a large 7.2 x 6.0 x 4.8 cm mass at the right paratracheal region, markedly enlarged from the prior study, partially encasing the right side of the trachea and the right mainstem bronchus, highly suspicious for small cell lung cancer. There is mild mass effect on the right side of the trachea. Right hilar and azygoesophageal recess nodes measure up to 2.0 cm in short axis, concerning for metastatic disease. No pericardial effusion is identified. Scattered prominent anterior mediastinal nodes are seen, measuring up to 1.7 cm in short axis. Lungs/Pleura: Bibasilar atelectasis is noted. No pleural effusion or pneumothorax is seen. No pulmonary nodules are identified. Upper Abdomen: The visualized portions of the liver and spleen are grossly unremarkable. Musculoskeletal: No acute osseous abnormalities are identified. The visualized musculature is unremarkable  in appearance. Review of the MIP images confirms the above findings. IMPRESSION: 1. No evidence of pulmonary embolus. 2. 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 Mild mass-effect on the right side of the trachea. 3. Right hilar and azygoesophageal recess nodes measure up to 2.0 cm in short axis. Prominent anterior mediastinal nodes measure up to 1.7 cm in short axis. These are concerning for metastatic disease. 4. Bibasilar atelectasis noted. These results were called by telephone at the time  of interpretation on 12/26/2015 at 5:37 am to [facility] PA, who verbally acknowledged these results. Electronically Signed   By: Garald Balding M.D.   On: 12/26/2015 05:40   Mr Jeri Cos GH Contrast  Result Date: 12/26/2015 CLINICAL DATA:  Lung cancer. Headache. Assess for metastatic disease. EXAM: MRI HEAD WITHOUT AND WITH CONTRAST TECHNIQUE: Multiplanar, multiecho pulse sequences of the brain and surrounding structures were obtained without and with intravenous contrast. CONTRAST:  44m MULTIHANCE GADOBENATE DIMEGLUMINE 529 MG/ML IV SOLN COMPARISON:  CT 05/30/2015 FINDINGS: Brain: The brain has normal appearance without evidence of malformation, atrophy, old or acute small or large vessel infarction, hemorrhage, hydrocephalus or extra-axial collection. No pituitary abnormality. After contrast administration, no abnormal enhancement occurs. Vascular: Major vessels at the base of the brain show flow. Skull and upper cervical spine: Normal Sinuses/Orbits: Clear/ normal. Other: None significant. IMPRESSION: Normal examination. No evidence of metastatic disease. No other finding to explain headache. Electronically Signed   By: MNelson ChimesM.D.   On: 12/26/2015 11:27   Ct Abdomen Pelvis W Contrast  Addendum Date: 12/27/2015   ADDENDUM REPORT: 12/27/2015 14:12 ADDENDUM: There is questionable sludge within the gallbladder. It may be prudent to consider ultrasound of the right upper quadrant given questionable sludge in the gallbladder as well as prominence of the common bile duct. Electronically Signed   By: WLowella GripIII M.D.   On: 12/27/2015 14:12   Result Date: 12/27/2015 CLINICAL DATA:  Chest adenopathy with concern for metastatic focus EXAM: CT ABDOMEN AND PELVIS WITH CONTRAST TECHNIQUE: Multidetector CT imaging of the abdomen and pelvis was performed using the standard protocol following bolus administration of intravenous contrast. Oral contrast was also administered. CONTRAST:  1050m ISOVUE-300 IOPAMIDOL (ISOVUE-300) INJECTION 61% COMPARISON:  CT abdomen and pelvis May 18, 2010; chest CT December 26, 2015 FINDINGS: Lower chest: Focal opacity in the posterior aspect of the superior segment right lower lobe is increased from 1 day prior and is consistent with either increased atelectasis or new focus infiltrate in this area. Lung bases otherwise are clear. Hepatobiliary: No focal liver lesions are evident. There is no appreciable gallbladder wall thickening. There is prominence of the common bile duct 10 mm without mass or calculus evident in the common bile duct on this study. Pancreas: There is no pancreatic mass or fluid collection. Spleen: No splenic lesions are evident. Adrenals/Urinary Tract: There is a 1.1 x 0.9 cm mass in the left adrenal. Right adrenal appears normal. Kidneys bilaterally show no demonstrable mass or hydronephrosis on either side. There is no renal or ureteral calculus on either side. Urinary bladder is midline with wall thickness within normal limits. Stomach/Bowel: There is fairly diffuse stool throughout colon. There is no bowel wall or mesenteric thickening. No bowel obstruction. No free air or portal venous air. Vascular/Lymphatic: There is no abdominal  aortic aneurysm. No vascular lesions are evident. There are a few scattered subcentimeter lymph nodes in the mesentery. By size criteria, there is no adenopathy in the abdomen or pelvis. Reproductive: Uterus is retroverted. There is no pelvic mass or pelvic fluid collection. Other: Appendix appears unremarkable. There is no ascites or abscess in the abdomen or pelvis. There is a small ventral hernia containing only fat. No omental lesions are demonstrable. Musculoskeletal: There are foci of degenerative change in the lumbar spine. There are no blastic or lytic bone lesions. There is no intramuscular or abdominal wall lesion evident. IMPRESSION: Small mass in left adrenal. A small metastasis must be of concern in  this region given the findings on recent chest CT. No adenopathy is apparent in the abdomen or pelvis. There are scattered subcentimeter mesenteric lymph nodes which do not meet size criteria for pathologic significance. No liver lesions are evident. No inflammatory foci are appreciated. No bowel obstruction. No abscess. No renal or ureteral calculus. No hydronephrosis. There is a small ventral hernia containing only fat. There is prominence of the common bile duct without mass or calculus evident. Significance of this finding is uncertain. Electronically Signed: By: Lowella Grip III M.D. On: 12/27/2015 14:08   US Biopsy  Result Date: 12/29/2015 EXAM: Ultrasound-guided core biopsy of the liver Interventional Radiologist:  Criselda Peaches, MD MEDICATIONS: None. ANESTHESIA/SEDATION: Fentanyl  mcg IV; Versed  mg IV Moderate Sedation Time: The patient was continuously monitored during the procedure by the interventional radiology nurse under my direct supervision. FLUOROSCOPY TIME:  Fluoroscopy Time:  minutes  seconds ( mGy). COMPLICATIONS: None immediate. PROCEDURE: Informed consent was obtained from the patient following explanation of the procedure, risks, benefits and alternatives. The patient understands, agrees and consents for the procedure. All questions were addressed. A time out was performed. The right upper quadrant was interrogated with ultrasound. A relatively avascular plane of the liver was identified. A suitable skin entry site was selected and marked. The region was then sterilely prepped and draped in standard fashion using chlorhexidine skin prep. Local anesthesia was attained by infiltration with 1% lidocaine. A small dermatotomy was made. Under real-time sonographic guidance, a 17 gauge trocar needle was advanced into the liver. Multiple 18 gauge core biopsies were then coaxially obtained. Needle placement was confirmed on all biopsy passes with real-time sonography. Biopsy specimens  were placed in formalin and delivered to pathology for further analysis. As the introducer needle was removed, the biopsy tract was embolized with a Gel-Foam slurry. Post biopsy ultrasound imaging demonstrates no active bleeding or perihepatic hematoma. The patient tolerated the procedure well. IMPRESSION: Technically successful ultrasound-guided random core biopsy of the liver. Electronically Signed   By: Jacqulynn Cadet M.D.   On: 12/29/2015 13:58   Dg Foot 2 Views Right  Result Date: 12/28/2015 CLINICAL DATA:  50 year old female with a history of right great toe pain EXAM: RIGHT FOOT - 2 VIEW COMPARISON:  None. FINDINGS: No displaced fracture identified.  No radiopaque foreign body. Degenerative changes of the interphalangeal joints. Minimal degenerative changes of the metatarsal-phalangeal joints. Degenerative changes of the midfoot and hindfoot. No focal soft tissue swelling. IMPRESSION: Negative for acute bony abnormality. Signed, Dulcy Fanny. Earleen Newport, DO [facility] Electronically Signed   By: [name] D.O.   On: 12/28/2015 16:51   Dg Swallowing Func-speech Pathology  Result Date: 12/30/2015 Objective Swallowing Evaluation: Type of Study: MBS-Modified Barium Swallow Study Patient Details Name: ARABEL BARCENAS MRN: [id number] Date  of Birth: [birth year] Today's Date: 12/30/2015 Time: SLP Start Time (ACUTE ONLY): 1405-SLP Stop Time (ACUTE ONLY): 1420 SLP Time Calculation (min) (ACUTE ONLY): 15 min Past Medical History: Past Medical History: Diagnosis Date . Asthma  . Diabetes mellitus  . Hypercholesteremia  . Hypertension  . Obese  Past Surgical History: Past Surgical History: Procedure Laterality Date . CESAREAN SECTION   . TUBAL LIGATION   HPI: Cinda Hara  is a 50 y.o. female, Asked medical history of Asthma, insulin-dependent diabetes, high cholesterol, hypertension, obesity, presents to ED with complaints of chest pain, right-sided,  intermittent, progressive over last few month, accompanied by shortness of breath, she denies any weight loss, any cough, any smoking history, any secondhand smoking, CT chest with contrast significant for large paratracheal mass, surrounding trachea and right bronchus, patient tachycardic on admission. Subjective: "I have to turn my head to swallow." Assessment / Plan / Recommendation CHL IP CLINICAL IMPRESSIONS 12/30/2015 Therapy Diagnosis WFL;Suspected primary esophageal dysphagia Clinical Impression Pt seen for followup MBSS after concerns for possible aspiration noted at bedside. However, pt tolerated all consistencies WFL. Pt reported globus sensation with all solids and barium tablet- indicating the upper esophagus, however complete clearance was noted. Pt did continue to demonstrate intermittent L head turn with solids, however no transient slowing of the bolus passage noted. Question if presence of paratracheal mass is interfering with esophageal sensation. No further SLP indicated at this time. Pt verbalized understanding of the results and recommendations.   Impact on safety and function No limitations   CHL IP TREATMENT RECOMMENDATION 12/30/2015 Treatment Recommendations No treatment recommended at this time   Prognosis 12/30/2015 Prognosis for Safe Diet Advancement Good Barriers to Reach Goals -- Barriers/Prognosis Comment -- CHL IP DIET RECOMMENDATION 12/30/2015 SLP Diet Recommendations Regular solids;Thin liquid Liquid Administration via Cup;Straw Medication Administration Whole meds with liquid Compensations Small sips/bites;Follow solids with liquid Postural Changes Seated upright at 90 degrees   CHL IP OTHER RECOMMENDATIONS 12/30/2015 Recommended Consults -- Oral Care Recommendations Oral care BID Other Recommendations --   CHL IP FOLLOW UP RECOMMENDATIONS 12/30/2015 Follow up Recommendations None   No flowsheet data found.     CHL IP ORAL PHASE 12/30/2015 Oral Phase WFL Oral - Pudding Teaspoon -- Oral -  Pudding Cup -- Oral - Honey Teaspoon -- Oral - Honey Cup -- Oral - Nectar Teaspoon -- Oral - Nectar Cup -- Oral - Nectar Straw -- Oral - Thin Teaspoon -- Oral - Thin Cup -- Oral - Thin Straw -- Oral - Puree -- Oral - Mech Soft -- Oral - Regular -- Oral - Multi-Consistency -- Oral - Pill -- Oral Phase - Comment --  CHL IP PHARYNGEAL PHASE 12/30/2015 Pharyngeal Phase WFL Pharyngeal- Pudding Teaspoon -- Pharyngeal -- Pharyngeal- Pudding Cup -- Pharyngeal -- Pharyngeal- Honey Teaspoon -- Pharyngeal -- Pharyngeal- Honey Cup -- Pharyngeal -- Pharyngeal- Nectar Teaspoon -- Pharyngeal -- Pharyngeal- Nectar Cup -- Pharyngeal -- Pharyngeal- Nectar Straw -- Pharyngeal -- Pharyngeal- Thin Teaspoon -- Pharyngeal -- Pharyngeal- Thin Cup -- Pharyngeal -- Pharyngeal- Thin Straw -- Pharyngeal -- Pharyngeal- Puree -- Pharyngeal -- Pharyngeal- Mechanical Soft -- Pharyngeal -- Pharyngeal- Regular -- Pharyngeal -- Pharyngeal- Multi-consistency -- Pharyngeal -- Pharyngeal- Pill -- Pharyngeal -- Pharyngeal Comment --  CHL IP CERVICAL ESOPHAGEAL PHASE 12/30/2015 Cervical Esophageal Phase WFL Pudding Teaspoon -- Pudding Cup -- Honey Teaspoon -- Honey Cup -- Nectar Teaspoon -- Nectar Cup -- Nectar Straw -- Thin Teaspoon -- Thin Cup -- Thin Straw -- Puree -- Mechanical Soft -- Regular --  Multi-consistency -- Pill -- Cervical Esophageal Comment -- No flowsheet data found. Vinetta Bergamo MA, [place] Pager [phone] 12/30/2015, 2:55 PM              US Abdomen Limited Ruq  Result Date: 12/28/2015 CLINICAL DATA:  Right upper quadrant pain EXAM: US ABDOMEN LIMITED - RIGHT UPPER QUADRANT COMPARISON:  CT abdomen pelvis dated 12/27/2015 FINDINGS: Gallbladder: Contracted gallbladder with suspected 9 mm gallstone. No gallbladder wall thickening or pericholecystic fluid. Negative sonographic Murphy's sign. Common bile duct: Diameter: 7 mm centrally. Liver: Coarse hepatic echotexture with hyperechoic hepatic parenchyma. No focal hepatic lesion is seen.  IMPRESSION: Cholelithiasis, without associated sonographic findings to suggest acute cholecystitis. Common duct measures 7 mm centrally, perhaps mildly prominent. However, in the absence of abnormal LFTs, this is of dubious clinical significance. Coarse hepatic echotexture with hyperechoic hepatic parenchyma, nonspecific, but raising the possibility of hepatic steatosis. Electronically Signed   By: Julian Hy M.D.   On: 12/28/2015 10:42    Microbiology: Recent Results (from the past 240 hour(s))  Surgical pcr screen     Status: None   Collection Time: 12/28/15 12:14 AM  Result Value Ref Range Status   MRSA, PCR NEGATIVE NEGATIVE Final   Staphylococcus aureus NEGATIVE NEGATIVE Final    Comment:        The Xpert SA Assay (FDA approved for NASAL specimens in patients over 53 years of age), is one component of a comprehensive surveillance program.  Test performance has been validated by [facility] for patients greater than or equal to 81 year old. It is not intended to diagnose infection nor to guide or monitor treatment.      Labs: Basic Metabolic Panel:  Recent Labs Lab 12/25/15 2016 12/27/15 0156 12/28/15 0214 12/29/15 0435 12/30/15 0303  NA 134* 139 138 136 137  K 3.7 3.8 3.8 3.8 3.7  CL 99* 104 104 104 103  CO2 '26 26 27 26 26  '$ GLUCOSE 352* 258* 256* 214* 291*  BUN '7 6 6 '$ <5* 7  CREATININE 0.71 0.58 0.58 0.55 0.55  CALCIUM 9.2 8.7* 8.9 8.7* 9.1  MG  --   --   --   --  1.5*   Liver Function Tests:  Recent Labs Lab 12/28/15 0214  AST 14*  ALT 12*  ALKPHOS 77  BILITOT 0.2*  PROT 7.1  ALBUMIN 3.1*   No results for input(s): LIPASE, AMYLASE in the last 168 hours. No results for input(s): AMMONIA in the last 168 hours. CBC:  Recent Labs Lab 12/25/15 2016 12/27/15 0156 12/28/15 0214 12/29/15 0435  WBC 8.9 7.6 7.6 8.0  NEUTROABS  --   --   --  4.9  HGB 10.3* 9.7* 9.9* 10.4*  HCT 33.7* 31.5* 31.9* 33.4*  MCV 84.0 84.5 83.9 83.9  PLT 377 337 361  346   Cardiac Enzymes:  Recent Labs Lab 12/26/15 1006 12/26/15 1541 12/26/15 2035  TROPONINI 0.04* 0.03* <0.03   BNP: BNP (last 3 results)  Recent Labs  12/25/15 2016  BNP 20.8    ProBNP (last 3 results) No results for input(s): PROBNP in the last 8760 hours.  CBG:  Recent Labs Lab 12/29/15 1126 12/29/15 1745 12/29/15 2041 12/30/15 0606 12/30/15 1108  GLUCAP 250* 358* 293* 325* 299*       SignedFlorencia Reasons MD, PhD  [facility] 12/30/2015, 3:50 PM

## 2015-12-30 NOTE — Evaluation (Addendum)
Clinical/Bedside Swallow Evaluation Patient Details  Name: Marilyn [name] MRN: [id number] Date of Birth: 09/25/1965  Today's Date: 12/30/2015 Time: SLP Start Time (ACUTE ONLY): 1135 SLP Stop Time (ACUTE ONLY): 1152 SLP Time Calculation (min) (ACUTE ONLY): 17 min  Past Medical History:  Past Medical History:  Diagnosis Date  . Asthma   . Diabetes mellitus   . Hypercholesteremia   . Hypertension   . Obese    Past Surgical History:  Past Surgical History:  Procedure Laterality Date  . CESAREAN SECTION    . TUBAL LIGATION     HPI:  Marilyn [name]  is a 50 y.o. female, Asked medical history of Asthma, insulin-dependent diabetes, high cholesterol, hypertension, obesity, presents to ED with complaints of chest pain, right-sided, intermittent, progressive over last few month, accompanied by shortness of breath, she denies any weight loss, any cough, any smoking history, any secondhand smoking, CT chest with contrast significant for large paratracheal mass, surrounding trachea and right bronchus, patient tachycardic on admission.   Assessment / Plan / Recommendation Clinical Impression  Pt referred for clinical swallow assessment given pt complaint of difficulty with "getting food to go down" for the past couple of weeks with reported episode of airway obstruction with chicken 1 1/2 weeks ago. During the assessment period pt was noted to practice a head turn to the L when swallowing solids and demonstrated a prompt coughing episode when attempting to use a liquid wash following a solid bolus. Pt reports she has started coughing whenever she eats. Given location of mass and concern for possible aspiration, recommend proceed with MBSS to r/o aspiration.     Aspiration Risk  Moderate aspiration risk    Diet Recommendation  (pending MBSS)   Supervision: Patient able to self feed    Other  Recommendations Oral Care Recommendations: Oral care BID   Follow up Recommendations None       Frequency and Duration            Prognosis Prognosis for Safe Diet Advancement: Good      Swallow Study   General Date of Onset: 12/26/15 HPI: Marilyn [name]  is a 50 y.o. female, Asked medical history of Asthma, insulin-dependent diabetes, high cholesterol, hypertension, obesity, presents to ED with complaints of chest pain, right-sided, intermittent, progressive over last few month, accompanied by shortness of breath, she denies any weight loss, any cough, any smoking history, any secondhand smoking, CT chest with contrast significant for large paratracheal mass, surrounding trachea and right bronchus, patient tachycardic on admission. Type of Study: Bedside Swallow Evaluation Previous Swallow Assessment: none Diet Prior to this Study: Regular;Thin liquids Temperature Spikes Noted: No Respiratory Status: Room air History of Recent Intubation: No Behavior/Cognition: Alert;Cooperative;Pleasant mood Oral Cavity Assessment: Within Functional Limits Oral Care Completed by SLP: No Oral Cavity - Dentition: Poor condition;Missing dentition Vision: Functional for self-feeding Self-Feeding Abilities: Able to feed self Patient Positioning:  (Upright EOB) Baseline Vocal Quality: Normal Volitional Cough: Strong Volitional Swallow: Able to elicit    Oral/Motor/Sensory Function Overall Oral Motor/Sensory Function: Within functional limits   Ice Chips Ice chips: Within functional limits Presentation: Spoon   Thin Liquid Thin Liquid: Impaired Presentation: Straw Pharyngeal  Phase Impairments: Cough - Immediate (when used as liquid wash)    Nectar Thick Nectar Thick Liquid: Not tested   Honey Thick Honey Thick Liquid: Not tested   Puree Puree: Within functional limits Presentation: Self Fed;Spoon   Solid   GO   Solid: Impaired Presentation: Self Fed Pharyngeal Phase  Impairments:  (globus sensation)        Vinetta Bergamo  MA, CCC-SLP Pager [phone] 12/30/2015,2:45 PM

## 2015-12-30 NOTE — Progress Notes (Signed)
  Echocardiogram 2D Echocardiogram has been performed.  Marilyn [name] 12/30/2015, 1:25 PM

## 2015-12-30 NOTE — Progress Notes (Signed)
MBSS complete. Full report located under chart review in imaging section. Pt tolerated all consistencies WFL. Proceed with regular diet. No further SLP services indicated.   Weldon Inches MA, [place] Pager [phone]

## 2015-12-31 ENCOUNTER — Encounter: Payer: Self-pay | Admitting: Radiation Oncology

## 2015-12-31 ENCOUNTER — Ambulatory Visit
Admission: RE | Admit: 2015-12-31 | Discharge: 2015-12-31 | Disposition: A | Discharge status: Home / Self Care | Payer: BC Managed Care – PPO | Source: Ambulatory Visit | Attending: Radiation Oncology | Admitting: Radiation Oncology

## 2015-12-31 ENCOUNTER — Ambulatory Visit
Admission: RE | Admit: 2015-12-31 | Payer: BC Managed Care – PPO | Source: Ambulatory Visit | Admitting: Radiation Oncology

## 2015-12-31 VITALS — BP 151/58 | HR 149 | Resp 20 | Ht 69.0 in | Wt 292.3 lb

## 2015-12-31 DIAGNOSIS — Z7984 Long term (current) use of oral hypoglycemic drugs: Secondary | ICD-10-CM | POA: Insufficient documentation

## 2015-12-31 DIAGNOSIS — Z6841 Body Mass Index (BMI) 40.0 and over, adult: Secondary | ICD-10-CM | POA: Diagnosis not present

## 2015-12-31 DIAGNOSIS — C3491 Malignant neoplasm of unspecified part of right bronchus or lung: Secondary | ICD-10-CM

## 2015-12-31 DIAGNOSIS — E7800 Pure hypercholesterolemia, unspecified: Secondary | ICD-10-CM | POA: Insufficient documentation

## 2015-12-31 DIAGNOSIS — I10 Essential (primary) hypertension: Secondary | ICD-10-CM | POA: Insufficient documentation

## 2015-12-31 DIAGNOSIS — Z79899 Other long term (current) drug therapy: Secondary | ICD-10-CM | POA: Insufficient documentation

## 2015-12-31 DIAGNOSIS — J45909 Unspecified asthma, uncomplicated: Secondary | ICD-10-CM | POA: Diagnosis not present

## 2015-12-31 DIAGNOSIS — Z510 Encounter for antineoplastic radiation therapy: Secondary | ICD-10-CM | POA: Insufficient documentation

## 2015-12-31 DIAGNOSIS — E1140 Type 2 diabetes mellitus with diabetic neuropathy, unspecified: Secondary | ICD-10-CM | POA: Diagnosis not present

## 2015-12-31 DIAGNOSIS — E669 Obesity, unspecified: Secondary | ICD-10-CM | POA: Diagnosis not present

## 2015-12-31 DIAGNOSIS — C7970 Secondary malignant neoplasm of unspecified adrenal gland: Secondary | ICD-10-CM | POA: Insufficient documentation

## 2015-12-31 DIAGNOSIS — C3492 Malignant neoplasm of unspecified part of left bronchus or lung: Secondary | ICD-10-CM

## 2015-12-31 DIAGNOSIS — C3490 Malignant neoplasm of unspecified part of unspecified bronchus or lung: Secondary | ICD-10-CM

## 2015-12-31 HISTORY — DX: Polyneuropathy, unspecified: G62.9

## 2015-12-31 MED ORDER — HYDROCODONE-ACETAMINOPHEN 7.5-325 MG/15ML PO SOLN: 15.0000 mL | Freq: Four times a day (QID) | ORAL | 0 refills | Status: DC | PRN

## 2015-12-31 NOTE — Progress Notes (Signed)
Radiation Oncology         [phone] ________________________________  Initial outpatient Consultation  Name: Marilyn [name] MRN: [id number]  Date: 12/31/2015  DOB: 03-23-1966  GE:[id number] Bunnie Domino, MD  [name], MD   REFERRING PHYSICIAN: [name], MD  DIAGNOSIS: The primary encounter diagnosis was Non-small cell lung cancer, unspecified laterality ([place]). A diagnosis of Non-small cell cancer of right lung [facility]) was also pertinent to this visit.    ICD-9-CM ICD-10-CM   1. Non-small cell lung cancer, unspecified laterality (HCC) 162.9 C34.90 NM PET Image Initial (PI) Skull Base To Thigh  2. Non-small cell cancer of right lung (HCC) 162.9 C34.91     HISTORY OF PRESENT ILLNESS: Marilyn [name] is a 50 y.o. female seen at the request of Dr. Erlinda Hong and Dr. Irene Limbo for a new diagnosis of non small cell lung cancer. The patient had been experiencing chest pain in February 2017 and was seen in the ED. At that time she had a CTPA that revealed some questionable adenopathy but no lung mass per se, she was counselled to follow up with repeat imaging. She presented to the ED on 12/26/15 and was found to have a 7.2 x 6 x 4.8 cm mass in the right paratracheal region partially encasing the right side of the trachea and right mainstem bronchus. No other pulmonary nodules or effusion was seen. There were also right hilar and azygoesophageal recess nodes measuring up to 2 cm and scattered anterior mediastinal nodes seen measuring up to 1.7 cm. A CT of the abdomen and pelvis on 12/27/15 revealed a small left adrenal lesion measuring 1.1 cm, and prominence of the common bile duct without mass or calculus. She had a CT neck that revealed right supraclavicular adenopathy, and a brain MRI that was negative, and underwent an ultrasound guided biopsy of a right supraclavicular node on 12/29/15 that revealed squamous cell carcinoma, consistent with NSCLC. She comes today to discuss the role of  radiotherapy.  PREVIOUS RADIATION THERAPY: No  PAST MEDICAL HISTORY:  Past Medical History:  Diagnosis Date  . Asthma   . Diabetes mellitus   . Hypercholesteremia   . Hypertension   . Lung cancer ([place])   . Neuropathy ([place])   . Obese       PAST SURGICAL HISTORY: Past Surgical History:  Procedure Laterality Date  . CESAREAN SECTION    . TUBAL LIGATION    . US guided core needle biopsy     of right lower neck/supraclavicular lymph nodes    FAMILY HISTORY:  Family History  Problem Relation Age of Onset  . Cancer Neg Hx   . Rheumatologic disease Neg Hx     SOCIAL HISTORY:  Social History   Social History  . Marital status: Divorced    Spouse name: N/A  . Number of children: N/A  . Years of education: N/A   Occupational History  . Not on file.   Social History Main Topics  . Smoking status: Never Smoker  . Smokeless tobacco: Never Used  . Alcohol use No  . Drug use: No  . Sexual activity: Not Currently   Other Topics Concern  . Not on file   Social History Narrative   No bird or mold exposure. No recent travel.  She reports that although she doesn't smoke, she has lived around smokers. She has two daughters who live with her and two grandchildren. She is also accompanied by her mother as well.   ALLERGIES: Review  of patient's allergies indicates no known allergies.  MEDICATIONS:  Current Outpatient Prescriptions  Medication Sig Dispense Refill  . Insulin Degludec (TRESIBA FLEXTOUCH) 200 UNIT/ML SOPN Inject 100 Units into the skin daily before breakfast.     . metoprolol tartrate (LOPRESSOR) 25 MG tablet Take 1 tablet (25 mg total) by mouth 2 (two) times daily. 60 tablet 0   No current facility-administered medications for this encounter.     REVIEW OF SYSTEMS:  On review of systems, the patient reports that she is doing ok. She is having trouble with chest wall pain and she denies any current shortness of breath, cough, fevers, chills, night sweats,  unintended weight changes. She denies any bowel or bladder disturbances, and denies abdominal pain, nausea or vomiting. She denies any new musculoskeletal or joint aches or pains. A complete review of systems is obtained and is otherwise negative.    PHYSICAL EXAM:  height is '5\' 9"'$  (1.753 m) and weight is 292 lb 4.8 oz (132.6 kg). Her blood pressure is 151/58 (abnormal) and her pulse is 149 (abnormal). Her respiration is 20 and oxygen saturation is 95%.   Pain Scale 8/10 In general this is a well appearing African American female in no acute distress. She is alert and oriented x4 and appropriate throughout the examination. HEENT reveals that the patient is normocephalic, atraumatic. EOMs are intact. PERRLA. Skin is intact without any evidence of gross lesions. Cardiovascular exam reveals a regular rate and rhythm, no clicks rubs or murmurs are auscultated. Chest is clear to auscultation bilaterally. Lymphatic assessment is performed and does not reveal any adenopathy in the cervical, supraclavicular, axillary, or inguinal chains. Abdomen has active bowel sounds in all quadrants and is intact. The abdomen is soft, non tender, non distended. Lower extremities are negative for pretibial pitting edema, deep calf tenderness, cyanosis or clubbing.   KPS = 70  100 - Normal; no complaints; no evidence of disease. 90   - Able to carry on normal activity; minor signs or symptoms of disease. 80   - Normal activity with effort; some signs or symptoms of disease. 73   - Cares for self; unable to carry on normal activity or to do active work. 60   - Requires occasional assistance, but is able to care for most of his personal needs. 50   - Requires considerable assistance and frequent medical care. 35   - Disabled; requires special care and assistance. 39   - Severely disabled; hospital admission is indicated although death not imminent. 28   - Very sick; hospital admission necessary; active supportive treatment  necessary. 10   - Moribund; fatal processes progressing rapidly. 0     - Dead  Karnofsky DA, Abelmann Patrick, Craver LS and Burchenal [facility] [phone]) The use of the nitrogen mustards in the palliative treatment of carcinoma: with particular reference to bronchogenic carcinoma Cancer 1 634-56  LABORATORY DATA:  Lab Results  Component Value Date   WBC 8.0 12/29/2015   HGB 10.4 (L) 12/29/2015   HCT 33.4 (L) 12/29/2015   MCV 83.9 12/29/2015   PLT 346 12/29/2015   Lab Results  Component Value Date   NA 137 12/30/2015   K 3.7 12/30/2015   CL 103 12/30/2015   CO2 26 12/30/2015   Lab Results  Component Value Date   ALT 12 (L) 12/28/2015   AST 14 (L) 12/28/2015   ALKPHOS 77 12/28/2015   BILITOT 0.2 (L) 12/28/2015     RADIOGRAPHY: Dg Chest 2 View  Result Date: 12/25/2015 CLINICAL DATA:  Generalize chronic chest pain, shortness of breath, and cough. History of asthma, diabetes, and hypertension. EXAM: CHEST  2 VIEW COMPARISON:  09/15/2015 FINDINGS: Right hilar and right peritracheal lymphadenopathy is demonstrated similar to prior study. Lungs are clear and expanded. No focal airspace disease or consolidation. No blunting of costophrenic angles. No pneumothorax. Normal heart size and pulmonary vascularity. Mild degenerative changes in the spine. IMPRESSION: Right hilar and right paratracheal lymphadenopathy is demonstrated, similar to prior study. No evidence of active pulmonary disease. Electronically Signed   By: Lucienne Capers M.D.   On: 12/25/2015 21:23   Ct Soft Tissue Neck W Contrast  Result Date: 12/27/2015 CLINICAL DATA:  Sore throat with right-sided swelling. Mediastinal mass on chest CT. EXAM: CT NECK WITH CONTRAST TECHNIQUE: Multidetector CT imaging of the neck was performed using the standard protocol following the bolus administration of intravenous contrast. CONTRAST:  114m ISOVUE-300 IOPAMIDOL (ISOVUE-300) INJECTION 61% COMPARISON:  CTA neck 05/30/2015 FINDINGS: Pharynx and larynx:  No pharyngeal mass or inflammatory change. Unremarkable larynx. Salivary glands: Submandibular and parotid glands are unremarkable. Thyroid: Unremarkable. Lymph nodes: There are bulky right level IV and supraclavicular lymph nodes which are new from the prior CTA. These measure up to at least 2 cm in short axis, with evaluation mildly limited by beam hardening. Enlarged right level V lymph nodes are also new and measure up to 1.5 cm in short axis. Subcentimeter short axis lymph nodes in the right neck more superiorly and scattered throughout the left neck (including an 8 mm short axis left level IV lymph node) are all similar to the prior CTA. Vascular: Major vascular structures of the neck appear patent. 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 Limited intracranial: Unremarkable. Visualized orbits: Not imaged. Mastoids and visualized paranasal sinuses: Clear. Skeleton: Mild lower cervical spondylosis. No destructive osseous lesion identified. subcentimeter sclerotic focus in the base of the C2 spinous processes unchanged, possibly a bone island. Upper chest: Bulky mediastinal lymphadenopathy, more fully evaluated on yesterday's chest CT. Other: None. IMPRESSION: 1. Unremarkable appearance of the pharynx. No evidence of acute process to explain sore throat. 2. Bulky lymphadenopathy in the right lower neck and supraclavicular regions, consistent with metastatic disease. Electronically Signed   By: ALogan BoresM.D.   On: 12/27/2015 14:08   Ct Angio Chest Pe W Or Wo Contrast  Result Date: 12/26/2015 CLINICAL DATA:  Acute onset of generalized chest pain, tachycardia, high blood pressure and shortness of breath. Initial encounter. EXAM: CT ANGIOGRAPHY CHEST WITH CONTRAST TECHNIQUE: Multidetector CT imaging of the chest was performed using the standard protocol during bolus administration of intravenous contrast. Multiplanar CT image reconstructions and MIPs were obtained to  evaluate the vascular anatomy. CONTRAST:  100 mL of Isovue 370 IV contrast COMPARISON:  Chest radiograph performed 12/25/2015, and CTA of the chest performed 05/30/2015 FINDINGS: Cardiovascular: There is no evidence of pulmonary embolus. There is mild mass effect on vasculature from the patient's mediastinal and right hilar masses. The thoracic aorta remains grossly intact. The great vessels are grossly unremarkable in appearance. Mediastinum/Nodes: There is a large 7.2 x 6.0 x 4.8 cm mass at the right paratracheal region, markedly enlarged from the prior study, partially encasing the right side of the trachea and the right mainstem bronchus, highly suspicious for small cell lung cancer. There is mild mass effect on the right side of the trachea. Right hilar and azygoesophageal recess nodes measure up to 2.0 cm in  short axis, concerning for metastatic disease. No pericardial effusion is identified. Scattered prominent anterior mediastinal nodes are seen, measuring up to 1.7 cm in short axis. Lungs/Pleura: Bibasilar atelectasis is noted. No pleural effusion or pneumothorax is seen. No pulmonary nodules are identified. Upper Abdomen: The visualized portions of the liver and spleen are grossly unremarkable. Musculoskeletal: No acute osseous abnormalities are identified. The visualized musculature is unremarkable in appearance. Review of the MIP images confirms the above findings. IMPRESSION: 1. No evidence of pulmonary embolus. 2. 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 Mild mass-effect on the right side of the trachea. 3. Right hilar and azygoesophageal recess nodes measure up to 2.0 cm in short axis. Prominent anterior mediastinal nodes measure up to 1.7 cm in short axis. These are concerning for metastatic disease. 4. Bibasilar  atelectasis noted. These results were called by telephone at the time of interpretation on 12/26/2015 at 5:37 am to [facility], who verbally acknowledged these results. Electronically Signed   By: Garald Balding M.D.   On: 12/26/2015 05:40   Mr Jeri Cos ZH Contrast  Result Date: 12/26/2015 CLINICAL DATA:  Lung cancer. Headache. Assess for metastatic disease. EXAM: MRI HEAD WITHOUT AND WITH CONTRAST TECHNIQUE: Multiplanar, multiecho pulse sequences of the brain and surrounding structures were obtained without and with intravenous contrast. CONTRAST:  90m MULTIHANCE GADOBENATE DIMEGLUMINE 529 MG/ML IV SOLN COMPARISON:  CT 05/30/2015 FINDINGS: Brain: The brain has normal appearance without evidence of malformation, atrophy, old or acute small or large vessel infarction, hemorrhage, hydrocephalus or extra-axial collection. No pituitary abnormality. After contrast administration, no abnormal enhancement occurs. Vascular: Major vessels at the base of the brain show flow. Skull and upper cervical spine: Normal Sinuses/Orbits: Clear/ normal. Other: None significant. IMPRESSION: Normal examination. No evidence of metastatic disease. No other finding to explain headache. Electronically Signed   By: MNelson ChimesM.D.   On: 12/26/2015 11:27   Ct Abdomen Pelvis W Contrast  Addendum Date: 12/27/2015   ADDENDUM REPORT: 12/27/2015 14:12 ADDENDUM: There is questionable sludge within the gallbladder. It may be prudent to consider ultrasound of the right upper quadrant given questionable sludge in the gallbladder as well as prominence of the common bile duct. Electronically Signed   By: WLowella GripIII M.D.   On: 12/27/2015 14:12   Result Date: 12/27/2015 CLINICAL DATA:  Chest adenopathy with concern for metastatic focus EXAM: CT ABDOMEN AND PELVIS WITH CONTRAST TECHNIQUE: Multidetector CT imaging of the abdomen and pelvis was performed using the standard protocol following bolus administration of  intravenous contrast. Oral contrast was also administered. CONTRAST:  1068mISOVUE-300 IOPAMIDOL (ISOVUE-300) INJECTION 61% COMPARISON:  CT abdomen and pelvis May 18, 2010; chest CT December 26, 2015 FINDINGS: Lower chest: Focal opacity in the posterior aspect of the superior segment right lower lobe is increased from 1 day prior and is consistent with either increased atelectasis or new focus infiltrate in this area. Lung bases otherwise are clear. Hepatobiliary: No focal liver lesions are evident. There is no appreciable gallbladder wall thickening. There is prominence of the common bile duct 10 mm without mass or calculus evident in the common bile duct on this study. Pancreas: There is no pancreatic mass or fluid collection. Spleen: No splenic lesions are evident. Adrenals/Urinary Tract: There is a 1.1 x 0.9 cm mass in the  left adrenal. Right adrenal appears normal. Kidneys bilaterally show no demonstrable mass or hydronephrosis on either side. There is no renal or ureteral calculus on either side. Urinary bladder is midline with wall thickness within normal limits. Stomach/Bowel: There is fairly diffuse stool throughout colon. There is no bowel wall or mesenteric thickening. No bowel obstruction. No free air or portal venous air. Vascular/Lymphatic: There is no abdominal aortic aneurysm. No vascular lesions are evident. There are a few scattered subcentimeter lymph nodes in the mesentery. By size criteria, there is no adenopathy in the abdomen or pelvis. Reproductive: Uterus is retroverted. There is no pelvic mass or pelvic fluid collection. Other: Appendix appears unremarkable. There is no ascites or abscess in the abdomen or pelvis. There is a small ventral hernia containing only fat. No omental lesions are demonstrable. Musculoskeletal: There are foci of degenerative change in the lumbar spine. There are no blastic or lytic bone lesions. There is no intramuscular or abdominal wall lesion evident.  IMPRESSION: Small mass in left adrenal. A small metastasis must be of concern in this region given the findings on recent chest CT. No adenopathy is apparent in the abdomen or pelvis. There are scattered subcentimeter mesenteric lymph nodes which do not meet size criteria for pathologic significance. No liver lesions are evident. No inflammatory foci are appreciated. No bowel obstruction. No abscess. No renal or ureteral calculus. No hydronephrosis. There is a small ventral hernia containing only fat. There is prominence of the common bile duct without mass or calculus evident. Significance of this finding is uncertain. Electronically Signed: By: Lowella Grip III M.D. On: 12/27/2015 14:08   US Biopsy  Result Date: 12/29/2015 EXAM: Ultrasound-guided core biopsy of the liver Interventional Radiologist:  Criselda Peaches, MD MEDICATIONS: None. ANESTHESIA/SEDATION: Fentanyl  mcg IV; Versed  mg IV Moderate Sedation Time: The patient was continuously monitored during the procedure by the interventional radiology nurse under my direct supervision. FLUOROSCOPY TIME:  Fluoroscopy Time:  minutes  seconds ( mGy). COMPLICATIONS: None immediate. PROCEDURE: Informed consent was obtained from the patient following explanation of the procedure, risks, benefits and alternatives. The patient understands, agrees and consents for the procedure. All questions were addressed. A time out was performed. The right upper quadrant was interrogated with ultrasound. A relatively avascular plane of the liver was identified. A suitable skin entry site was selected and marked. The region was then sterilely prepped and draped in standard fashion using chlorhexidine skin prep. Local anesthesia was attained by infiltration with 1% lidocaine. A small dermatotomy was made. Under real-time sonographic guidance, a 17 gauge trocar needle was advanced into the liver. Multiple 18 gauge core biopsies were then coaxially obtained. Needle placement  was confirmed on all biopsy passes with real-time sonography. Biopsy specimens were placed in formalin and delivered to pathology for further analysis. As the introducer needle was removed, the biopsy tract was embolized with a Gel-Foam slurry. Post biopsy ultrasound imaging demonstrates no active bleeding or perihepatic hematoma. The patient tolerated the procedure well. IMPRESSION: Technically successful ultrasound-guided random core biopsy of the liver. Electronically Signed   By: Jacqulynn Cadet M.D.   On: 12/29/2015 13:58   Dg Foot 2 Views Right  Result Date: 12/28/2015 CLINICAL DATA:  50 year old female with a history of right great toe pain EXAM: RIGHT FOOT - 2 VIEW COMPARISON:  None. FINDINGS: No displaced fracture identified.  No radiopaque foreign body. Degenerative changes of the interphalangeal joints. Minimal degenerative changes of the metatarsal-phalangeal joints. Degenerative changes of the midfoot  and hindfoot. No focal soft tissue swelling. IMPRESSION: Negative for acute bony abnormality. Signed, Dulcy Fanny. Earleen Newport, DO [facility] Electronically Signed   By: [name] D.O.   On: 12/28/2015 16:51   Dg Swallowing Func-speech Pathology  Result Date: 12/30/2015 Objective Swallowing Evaluation: Type of Study: MBS-Modified Barium Swallow Study Patient Details Name: ESTER HILLEY MRN: [id number] Date of Birth: 1965-08-02 Today's Date: 12/30/2015 Time: SLP Start Time (ACUTE ONLY): 1405-SLP Stop Time (ACUTE ONLY): 1420 SLP Time Calculation (min) (ACUTE ONLY): 15 min Past Medical History: Past Medical History: Diagnosis Date . Asthma  . Diabetes mellitus  . Hypercholesteremia  . Hypertension  . Obese  Past Surgical History: Past Surgical History: Procedure Laterality Date . CESAREAN SECTION   . TUBAL LIGATION   HPI: Guyla Bless  is a 50 y.o. female, Asked medical history of Asthma, insulin-dependent diabetes, high cholesterol, hypertension,  obesity, presents to ED with complaints of chest pain, right-sided, intermittent, progressive over last few month, accompanied by shortness of breath, she denies any weight loss, any cough, any smoking history, any secondhand smoking, CT chest with contrast significant for large paratracheal mass, surrounding trachea and right bronchus, patient tachycardic on admission. Subjective: "I have to turn my head to swallow." Assessment / Plan / Recommendation CHL IP CLINICAL IMPRESSIONS 12/30/2015 Therapy Diagnosis WFL;Suspected primary esophageal dysphagia Clinical Impression Pt seen for followup MBSS after concerns for possible aspiration noted at bedside. However, pt tolerated all consistencies WFL. Pt reported globus sensation with all solids and barium tablet- indicating the upper esophagus, however complete clearance was noted. Pt did continue to demonstrate intermittent L head turn with solids, however no transient slowing of the bolus passage noted. Question if presence of paratracheal mass is interfering with esophageal sensation. No further SLP indicated at this time. Pt verbalized understanding of the results and recommendations.   Impact on safety and function No limitations   CHL IP TREATMENT RECOMMENDATION 12/30/2015 Treatment Recommendations No treatment recommended at this time   Prognosis 12/30/2015 Prognosis for Safe Diet Advancement Good Barriers to Reach Goals -- Barriers/Prognosis Comment -- CHL IP DIET RECOMMENDATION 12/30/2015 SLP Diet Recommendations Regular solids;Thin liquid Liquid Administration via Cup;Straw Medication Administration Whole meds with liquid Compensations Small sips/bites;Follow solids with liquid Postural Changes Seated upright at 90 degrees   CHL IP OTHER RECOMMENDATIONS 12/30/2015 Recommended Consults -- Oral Care Recommendations Oral care BID Other Recommendations --   CHL IP FOLLOW UP RECOMMENDATIONS 12/30/2015 Follow up Recommendations None   No flowsheet data found.     CHL IP  ORAL PHASE 12/30/2015 Oral Phase WFL Oral - Pudding Teaspoon -- Oral - Pudding Cup -- Oral - Honey Teaspoon -- Oral - Honey Cup -- Oral - Nectar Teaspoon -- Oral - Nectar Cup -- Oral - Nectar Straw -- Oral - Thin Teaspoon -- Oral - Thin Cup -- Oral - Thin Straw -- Oral - Puree -- Oral - Mech Soft -- Oral - Regular -- Oral - Multi-Consistency -- Oral - Pill -- Oral Phase - Comment --  CHL IP PHARYNGEAL PHASE 12/30/2015 Pharyngeal Phase WFL Pharyngeal- Pudding Teaspoon -- Pharyngeal -- Pharyngeal- Pudding Cup -- Pharyngeal -- Pharyngeal- Honey Teaspoon -- Pharyngeal -- Pharyngeal- Honey Cup -- Pharyngeal -- Pharyngeal- Nectar Teaspoon -- Pharyngeal -- Pharyngeal- Nectar Cup -- Pharyngeal -- Pharyngeal- Nectar Straw -- Pharyngeal -- Pharyngeal- Thin Teaspoon -- Pharyngeal -- Pharyngeal- Thin Cup -- Pharyngeal -- Pharyngeal- Thin Straw -- Pharyngeal -- Pharyngeal- Puree -- Pharyngeal -- Pharyngeal- Mechanical Soft --  Pharyngeal -- Pharyngeal- Regular -- Pharyngeal -- Pharyngeal- Multi-consistency -- Pharyngeal -- Pharyngeal- Pill -- Pharyngeal -- Pharyngeal Comment --  CHL IP CERVICAL ESOPHAGEAL PHASE 12/30/2015 Cervical Esophageal Phase WFL Pudding Teaspoon -- Pudding Cup -- Honey Teaspoon -- Honey Cup -- Nectar Teaspoon -- Nectar Cup -- Nectar Straw -- Thin Teaspoon -- Thin Cup -- Thin Straw -- Puree -- Mechanical Soft -- Regular -- Multi-consistency -- Pill -- Cervical Esophageal Comment -- No flowsheet data found. Vinetta Bergamo MA, [place] Pager [phone] 12/30/2015, 2:55 PM              US Abdomen Limited Ruq  Result Date: 12/28/2015 CLINICAL DATA:  Right upper quadrant pain EXAM: US ABDOMEN LIMITED - RIGHT UPPER QUADRANT COMPARISON:  CT abdomen pelvis dated 12/27/2015 FINDINGS: Gallbladder: Contracted gallbladder with suspected 9 mm gallstone. No gallbladder wall thickening or pericholecystic fluid. Negative sonographic Murphy's sign. Common bile duct: Diameter: 7 mm centrally. Liver: Coarse hepatic echotexture  with hyperechoic hepatic parenchyma. No focal hepatic lesion is seen. IMPRESSION: Cholelithiasis, without associated sonographic findings to suggest acute cholecystitis. Common duct measures 7 mm centrally, perhaps mildly prominent. However, in the absence of abnormal LFTs, this is of dubious clinical significance. Coarse hepatic echotexture with hyperechoic hepatic parenchyma, nonspecific, but raising the possibility of hepatic steatosis. Electronically Signed   By: Julian Hy M.D.   On: 12/28/2015 10:42      IMPRESSION/PLAN: 1. Advanced NSCLC, squamous cell carcinoma of the right lung. Dr. Tammi Klippel reviews her diagnostic work up including imaging studies and pathology. He reviews the CT of the abdomen revealed a lesion on her left adrenal gland, and that he would recommend a staging PET scan to better understand her formal stage and to determine staging. Because of the advanced nature of her disease, we could consider palliative radiotherapy. However given her young age and performance status, we will proceed with radiotherapy with the intention of radiation and concurrent chemotherapy, hopeful that  if she truly has stage IV disease, is her adrenal gland representing oliogometastatic disease. Her tumor is undergoing molecular studies currently which Dr. Irene Limbo will await as well. We planned to proceed with simulation tomorrow. Dr. Tammi Klippel discusses the risks, benefits, short, and long term effects from radiotherapy, and the patient is interested in moving forward.   The above documentation reflects my direct findings during this shared patient visit. Please see the separate note by Dr. Tammi Klippel on this date for the remainder of the patient's plan of care.   Carola Rhine, PAC

## 2015-12-31 NOTE — Progress Notes (Signed)
See progress note under physician encounter. 

## 2015-12-31 NOTE — Progress Notes (Signed)
Thoracic Location of Tumor / Histology: metastatic non small cell carcinoma  Patient presented several times to the emergency room with symptoms of increasing chest pain, shortness of breath and worsening cough over the last 3-4 months. Patient returned again to the emergency room Thursday night with the same symptoms.     Tobacco/Marijuana/Snuff/ETOH use: no  Past/Anticipated interventions by cardiothoracic surgery, if any: no  Past/Anticipated interventions by medical oncology, if any: recommending concurrent chemo and radiation  Signs/Symptoms  Weight changes, if any: no  Respiratory complaints, if any: shortness of breath at rest, dry cough worse while eating, dysphagia  Hemoptysis, if any: no  Pain issues, if any:  Chest tightness and neuropathy leg pain. In addition reports chronic low back pain.   SAFETY ISSUES:  Prior radiation? no  Pacemaker/ICD? no   Possible current pregnancy?no  Is the patient on methotrexate? no  Current Complaints / other details:  50 year old female. Divorced. Works full time for [employer] as a Scientist, water quality. Scheduled at 4 pm today to meet new PCP in new clinic. Denies family history of cancer but, unaware of paternal family history. BP and heart rate elevated. Reports she was prescribed Losartan in the past to manage bp but, she didn't take it. While hospitalized she was prescribed lopressor. States, "they gave me a bunch of medication but, I only take those two." Reports headache, dizziness, occasional nausea (worse while laying down), poor appetite, and frequent hot flashes. Denies vomiting, diplopia or ringing in the ears.

## 2016-01-01 ENCOUNTER — Other Ambulatory Visit: Payer: Self-pay | Admitting: Hematology

## 2016-01-01 ENCOUNTER — Ambulatory Visit
Admission: RE | Admit: 2016-01-01 | Discharge: 2016-01-01 | Disposition: A | Discharge status: Home / Self Care | Payer: BC Managed Care – PPO | Source: Ambulatory Visit | Attending: Radiation Oncology | Admitting: Radiation Oncology

## 2016-01-01 DIAGNOSIS — C3491 Malignant neoplasm of unspecified part of right bronchus or lung: Secondary | ICD-10-CM

## 2016-01-01 DIAGNOSIS — C3492 Malignant neoplasm of unspecified part of left bronchus or lung: Secondary | ICD-10-CM | POA: Insufficient documentation

## 2016-01-01 DIAGNOSIS — Z510 Encounter for antineoplastic radiation therapy: Secondary | ICD-10-CM | POA: Diagnosis not present

## 2016-01-01 MED ORDER — PROCHLORPERAZINE MALEATE 10 MG PO TABS: 10.0000 mg | ORAL_TABLET | Freq: Four times a day (QID) | ORAL | 1 refills | Status: DC | PRN

## 2016-01-01 MED ORDER — ONDANSETRON HCL 8 MG PO TABS: 8.0000 mg | ORAL_TABLET | Freq: Two times a day (BID) | ORAL | 1 refills | Status: DC | PRN

## 2016-01-01 MED ORDER — LIDOCAINE-PRILOCAINE 2.5-2.5 % EX CREA: TOPICAL_CREAM | CUTANEOUS | 3 refills | Status: DC

## 2016-01-01 MED ORDER — DEXAMETHASONE 4 MG PO TABS: 8.0000 mg | ORAL_TABLET | Freq: Every day | ORAL | 1 refills | Status: DC

## 2016-01-01 NOTE — Progress Notes (Signed)
START ON PATHWAY REGIMEN - Non-Small Cell Lung  OEH212: Carboplatin AUC=2 + Paclitaxel 45 mg/m2 Weekly During Radiation   Administer weekly:     Paclitaxel (Taxol(R)) 45 mg/m2 in 250 mL NS IV over 1 hour followed by Dose Mod: None     Carboplatin (Paraplatin(R)) AUC=2 in 100 mL NS IV over 30 minutes Dose Mod: None Additional Orders: * All AUC calculations intended to be used in [employer] formula  **Always confirm dose/schedule in your pharmacy ordering system**    Patient Characteristics: Stage III - Unresectable, PS = 0, 1 AJCC M Stage: X AJCC N Stage: X AJCC T Stage: X Current Disease Status: No Distant Metastases or Local Recurrence AJCC Stage Grouping: IIIB Performance Status: PS = 0, 1  Intent of Therapy: Non-Curative / Palliative Intent, Not Discussed with Patient

## 2016-01-01 NOTE — Progress Notes (Signed)
  Radiation Oncology         [phone] ________________________________  Name: Marilyn [name] MRN: [id number]  Date: 01/01/2016  DOB: 1965/05/23  SIMULATION AND TREATMENT PLANNING NOTE    ICD-9-CM ICD-10-CM   1. 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  Identity was confirmed.  All relevant records and images related to the planned course of therapy were reviewed.  The patient freely provided informed written consent to proceed with treatment after reviewing the details related to the planned course of therapy. The consent form was witnessed and verified by the simulation staff.  Then, the patient was set-up in a stable reproducible  supine position for radiation therapy.  CT images were obtained.  Surface markings were placed.  The CT images were loaded into the planning software.  Then the target and avoidance structures were contoured.  Treatment planning then occurred.  The radiation prescription was entered and confirmed.  Then, I designed and supervised the construction of a total of 6 medically necessary complex treatment devices, including a BodyFix immobilization mold custom fitted to the patient along with 5 multileaf collimators conformally shaped radiation around the treatment target while shielding critical structures such as the heart and spinal cord maximally.  I have requested : 3D Simulation  I have requested a DVH of the following structures: Left lung, right lung, spinal cord, heart, esophagus, and target.  I have ordered:Nutrition Consult  SPECIAL TREATMENT PROCEDURE:  The planned course of therapy using radiation constitutes a special treatment procedure. Special care is required in the management of this patient for the following  reasons.  The patient will be receiving concurrent chemotherapy requiring careful monitoring for increased toxicities of treatment including periodic laboratory values.  The special nature of the planned course of radiotherapy will require increased physician supervision and oversight to ensure patient's safety with optimal treatment outcomes.  PLAN:  The patient will receive 66 Gy in 33 fractions.  Depending on PET findings, if adrenal lesion is isolated solitary met, will consider SBRT.  If widespread disease seen, will abbreviate thoracic RT at 36 Gy for palliative chest RT.  ________________________________  Sheral Apley. Tammi Klippel, M.D.

## 2016-01-02 ENCOUNTER — Telehealth: Payer: Self-pay | Admitting: Hematology

## 2016-01-02 NOTE — Telephone Encounter (Signed)
Appt scheduled with Kale for 10/2 at 345pm. Pt voiced understanding. Demographics verified.

## 2016-01-04 DIAGNOSIS — Z510 Encounter for antineoplastic radiation therapy: Secondary | ICD-10-CM | POA: Diagnosis not present

## 2016-01-05 ENCOUNTER — Ambulatory Visit
Admission: RE | Admit: 2016-01-05 | Discharge: 2016-01-05 | Disposition: A | Discharge status: Home / Self Care | Payer: BC Managed Care – PPO | Source: Ambulatory Visit | Attending: Radiation Oncology | Admitting: Radiation Oncology

## 2016-01-05 ENCOUNTER — Ambulatory Visit (HOSPITAL_BASED_OUTPATIENT_CLINIC_OR_DEPARTMENT_OTHER): Payer: BC Managed Care – PPO | Admitting: Hematology

## 2016-01-05 ENCOUNTER — Encounter: Payer: Self-pay | Admitting: Hematology

## 2016-01-05 ENCOUNTER — Telehealth: Payer: Self-pay | Admitting: Hematology

## 2016-01-05 VITALS — BP 128/85 | HR 138 | Temp 98.2°F | Resp 20 | Wt 289.6 lb

## 2016-01-05 DIAGNOSIS — C770 Secondary and unspecified malignant neoplasm of lymph nodes of head, face and neck: Secondary | ICD-10-CM | POA: Diagnosis not present

## 2016-01-05 DIAGNOSIS — R1310 Dysphagia, unspecified: Secondary | ICD-10-CM

## 2016-01-05 DIAGNOSIS — E785 Hyperlipidemia, unspecified: Secondary | ICD-10-CM

## 2016-01-05 DIAGNOSIS — E119 Type 2 diabetes mellitus without complications: Secondary | ICD-10-CM

## 2016-01-05 DIAGNOSIS — E279 Disorder of adrenal gland, unspecified: Secondary | ICD-10-CM

## 2016-01-05 DIAGNOSIS — G893 Neoplasm related pain (acute) (chronic): Secondary | ICD-10-CM

## 2016-01-05 DIAGNOSIS — C3491 Malignant neoplasm of unspecified part of right bronchus or lung: Secondary | ICD-10-CM

## 2016-01-05 DIAGNOSIS — Z510 Encounter for antineoplastic radiation therapy: Secondary | ICD-10-CM | POA: Diagnosis not present

## 2016-01-05 DIAGNOSIS — I10 Essential (primary) hypertension: Secondary | ICD-10-CM

## 2016-01-05 MED ORDER — CHLORHEXIDINE GLUCONATE 0.12 % MT SOLN: 15.0000 mL | Freq: Two times a day (BID) | OROMUCOSAL | 0 refills | Status: DC

## 2016-01-05 NOTE — Telephone Encounter (Signed)
Gave pateint avs report and appointment for ched class 10/4. Message to infusion managers re starting tx 10/5 - patient aware. Left message for IR re port -placement - IR will call patient.

## 2016-01-05 NOTE — Progress Notes (Signed)
[name]    HEMATOLOGY/ONCOLOGY CLINIC NOTE  Date of Service: 01/05/2016  Patient Care Team: Nolene Ebbs, MD as PCP - General (Internal Medicine)  CHIEF COMPLAINTS/PURPOSE OF CONSULTATION:  Newly diagnosed Lung Adenocarcinoma.  HISTORY OF PRESENTING ILLNESS:   Marilyn [name] is a wonderful 50 y.o. female  Who is here for her post-hospitalization followup for newly diagnosed lung adenocarcinoma.  Patient has history of hypertension, diabetes, dyslipidemia, morbid obesity, asthma who presents with symptoms of increasing chest pain shortness of breath and worsening cough over the last 3-4 months. Patient notes she was in the emergency room in February 2017 with some chest pain and had a CTA of the chest - she was noted to have no evidence of pulmonary embolism or aortic dissection. There was minimal infiltrative changes within the right hilar area and some mediastinal adenopathy. She was recommended repeat imaging in 3-6 months with her primary care physician. Patient notes that she never really followed up with her primary care physician for this.  Patient was seen in the emergency room early morning on 12/26/2015 with worsening chest pain shortness of breath and cough and had a CTA of the chest repeated. This showed no evidence of pulmonary embolism but she was noted to have a large 7.2 x 6 x 4.8 cm mass in the right paratracheal region which is markedly enlarged from the previous study and partially encases the right side of the trachea and the right mainstem bronchus with mild mass effect on the right side of the trachea. She is also noted to have right hilar and other mediastinal adenopathy. I recommended patient be admitted for an expedited workup given threatened airway and other high impact organ threatening disease -to speed up the diagnosis and optimize time to treatment. Patient was admitted to the hospitalist service. I discussed the patient with Dr. Waldron Labs. Patient has had an MRI of the  brain for evaluation of some headaches and to complete her staging. No evidence of brain metastases were noted.  Pulmonary has been consulted for possible EBUS and biopsy for tissue diagnosis. They want to wait until the patient has a CT of the abdomen to rule out other easily biopsable lesions.  Notes no significant weight loss. No fevers no chills no night sweats .  Patient reports that she has been a lifelong nonsmoker.  Patient subsequently had a US guided biopsy of her Rt supraclavicular LN that was consistent with Non small cell lung cancer. She is here to discuss further management.  She has been seen by radiation oncology and has been setup for concurrent chemotherapy/radiation for Stage IIIB lung adenocarcinoma. She has been setup for a PET/CT on 01/12/2016. Patient notes no other acute symptoms at this time.   MEDICAL HISTORY:  Past Medical History:  Diagnosis Date  . Asthma   . Diabetes mellitus   . Hypercholesteremia   . Hypertension   . Lung cancer ([place])   . Neuropathy ([place])   . Obese     SURGICAL HISTORY: Past Surgical History:  Procedure Laterality Date  . CESAREAN SECTION    . TUBAL LIGATION    . US guided core needle biopsy     of right lower neck/supraclavicular lymph nodes    SOCIAL HISTORY: Social History   Social History  . Marital status: Divorced    Spouse name: N/A  . Number of children: N/A  . Years of education: N/A   Occupational History  . Not on file.   Social History Main Topics  .  Smoking status: Never Smoker  . Smokeless tobacco: Never Used  . Alcohol use No  . Drug use: No  . Sexual activity: Not Currently   Other Topics Concern  . Not on file   Social History Narrative   No bird or mold exposure. No recent travel.    FAMILY HISTORY: Family History  Problem Relation Age of Onset  . Cancer Neg Hx   . Rheumatologic disease Neg Hx     ALLERGIES:  has No Known Allergies.  MEDICATIONS:  Current Outpatient  Prescriptions  Medication Sig Dispense Refill  . dexamethasone (DECADRON) 4 MG tablet Take 2 tablets (8 mg total) by mouth daily. Start the day after chemotherapy for 2 days. 30 tablet 1  . HYDROcodone-acetaminophen (HYCET) 7.5-325 mg/15 ml solution Take 15 mLs by mouth 4 (four) times daily as needed for moderate pain. 473 mL 0  . Insulin Degludec (TRESIBA FLEXTOUCH) 200 UNIT/ML SOPN Inject 100 Units into the skin daily before breakfast.     . lidocaine-prilocaine (EMLA) cream Apply to affected area once 30 g 3  . metoprolol tartrate (LOPRESSOR) 25 MG tablet Take 1 tablet (25 mg total) by mouth 2 (two) times daily. 60 tablet 0  . ondansetron (ZOFRAN) 8 MG tablet Take 1 tablet (8 mg total) by mouth 2 (two) times daily as needed for refractory nausea / vomiting. Start on day 3 after chemo. 30 tablet 1  . prochlorperazine (COMPAZINE) 10 MG tablet Take 1 tablet (10 mg total) by mouth every 6 (six) hours as needed (Nausea or vomiting). 30 tablet 1   No current facility-administered medications for this visit.     REVIEW OF SYSTEMS:    10 Point review of Systems was done is negative except as noted above.  PHYSICAL EXAMINATION: ECOG PERFORMANCE STATUS: 1 - Symptomatic but completely ambulatory  . Vitals:   01/05/16 1550  BP: 128/85  Pulse: (!) 138  Resp: 20  Temp: 98.2 F (36.8 C)   Filed Weights   01/05/16 1550  Weight: 289 lb 9.6 oz (131.4 kg)   .Body mass index is 42.77 kg/m.    GENERAL:alert, in no acute distress and comfortable SKIN: skin color, texture, turgor are normal, no rashes or significant lesions EYES: normal, conjunctiva are pink and non-injected, sclera clear OROPHARYNX:no exudate, no erythema and lips, buccal mucosa, and tongue normal  NECK: supple, no JVD, thyroid normal size, non-tender, without nodularity LYMPH:  no palpable lymphadenopathy in the cervical, axillary or inguinal. Her right paratracheal mass appears to be extending into the right  supraclavicular space and is palpable clinically.               LUNGS: clear to auscultation with normal respiratory effort. Distant breath sounds  HEART: regular rate & rhythm,  no murmurs and no lower extremity edema ABDOMEN: abdomen obese, soft, non-tender, normoactive bowel sounds  Musculoskeletal: no cyanosis of digits and no clubbing  PSYCH: alert & oriented x 3 with fluent speech NEURO: no focal motor/sensory deficits LABORATORY DATA:  I have reviewed the data as listed  . CBC Latest Ref Rng & Units 12/29/2015 12/28/2015 12/27/2015  WBC 4.0 - 10.5 K/uL 8.0 7.6 7.6  Hemoglobin 12.0 - 15.0 g/dL 10.4(L) 9.9(L) 9.7(L)  Hematocrit 36.0 - 46.0 % 33.4(L) 31.9(L) 31.5(L)  Platelets 150 - 400 K/uL 346 361 337    . CMP Latest Ref Rng & Units 12/30/2015 12/29/2015 12/28/2015  Glucose 65 - 99 mg/dL 291(H) 214(H) 256(H)  BUN 6 - 20 mg/dL 7 <5(L)  6  Creatinine 0.44 - 1.00 mg/dL 0.55 0.55 0.58  Sodium 135 - 145 mmol/L 137 136 138  Potassium 3.5 - 5.1 mmol/L 3.7 3.8 3.8  Chloride 101 - 111 mmol/L 103 104 104  CO2 22 - 32 mmol/L '26 26 27  ' Calcium 8.9 - 10.3 mg/dL 9.1 8.7(L) 8.9  Total Protein 6.5 - 8.1 g/dL - - 7.1  Total Bilirubin 0.3 - 1.2 mg/dL - - 0.2(L)  Alkaline Phos 38 - 126 U/L - - 77  AST 15 - 41 U/L - - 14(L)  ALT 14 - 54 U/L - - 12(L)     RADIOGRAPHIC STUDIES: I have personally reviewed the radiological images as listed and agreed with the findings in the report. Dg Chest 2 View  Result Date: 12/25/2015 CLINICAL DATA:  Generalize chronic chest pain, shortness of breath, and cough. History of asthma, diabetes, and hypertension. EXAM: CHEST  2 VIEW COMPARISON:  09/15/2015 FINDINGS: Right hilar and right peritracheal lymphadenopathy is demonstrated similar to prior study. Lungs are clear and expanded. No focal airspace disease or consolidation. No blunting of costophrenic angles. No pneumothorax. Normal heart size and pulmonary vascularity. Mild degenerative changes in the spine.  IMPRESSION: Right hilar and right paratracheal lymphadenopathy is demonstrated, similar to prior study. No evidence of active pulmonary disease. Electronically Signed   By: Lucienne Capers M.D.   On: 12/25/2015 21:23   Ct Soft Tissue Neck W Contrast  Result Date: 12/27/2015 CLINICAL DATA:  Sore throat with right-sided swelling. Mediastinal mass on chest CT. EXAM: CT NECK WITH CONTRAST TECHNIQUE: Multidetector CT imaging of the neck was performed using the standard protocol following the bolus administration of intravenous contrast. CONTRAST:  159m ISOVUE-300 IOPAMIDOL (ISOVUE-300) INJECTION 61% COMPARISON:  CTA neck 05/30/2015 FINDINGS: Pharynx and larynx: No pharyngeal mass or inflammatory change. Unremarkable larynx. Salivary glands: Submandibular and parotid glands are unremarkable. Thyroid: Unremarkable. Lymph nodes: There are bulky right level IV and supraclavicular lymph nodes which are new from the prior CTA. These measure up to at least 2 cm in short axis, with evaluation mildly limited by beam hardening. Enlarged right level V lymph nodes are also new and measure up to 1.5 cm in short axis. Subcentimeter short axis lymph nodes in the right neck more superiorly and scattered throughout the left neck (including an 8 mm short axis left level IV lymph node) are all similar to the prior CTA. Vascular: Major vascular structures of the neck appear patent. 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 Limited intracranial: Unremarkable. Visualized orbits: Not imaged. Mastoids and visualized paranasal sinuses: Clear. Skeleton: Mild lower cervical spondylosis. No destructive osseous lesion identified. subcentimeter sclerotic focus in the base of the C2 spinous processes unchanged, possibly a bone island. Upper chest: Bulky mediastinal lymphadenopathy, more fully evaluated on yesterday's chest CT. Other: None. IMPRESSION: 1. Unremarkable appearance of the pharynx. No evidence of  acute process to explain sore throat. 2. Bulky lymphadenopathy in the right lower neck and supraclavicular regions, consistent with metastatic disease. Electronically Signed   By: ALogan BoresM.D.   On: 12/27/2015 14:08   Ct Angio Chest Pe W Or Wo Contrast  Result Date: 12/26/2015 CLINICAL DATA:  Acute onset of generalized chest pain, tachycardia, high blood pressure and shortness of breath. Initial encounter. EXAM: CT ANGIOGRAPHY CHEST WITH CONTRAST TECHNIQUE: Multidetector CT imaging of the chest was performed using the standard protocol during bolus administration of intravenous contrast. Multiplanar CT image reconstructions and MIPs were obtained to  evaluate the vascular anatomy. CONTRAST:  100 mL of Isovue 370 IV contrast COMPARISON:  Chest radiograph performed 12/25/2015, and CTA of the chest performed 05/30/2015 FINDINGS: Cardiovascular: There is no evidence of pulmonary embolus. There is mild mass effect on vasculature from the patient's mediastinal and right hilar masses. The thoracic aorta remains grossly intact. The great vessels are grossly unremarkable in appearance. Mediastinum/Nodes: There is a large 7.2 x 6.0 x 4.8 cm mass at the right paratracheal region, markedly enlarged from the prior study, partially encasing the right side of the trachea and the right mainstem bronchus, highly suspicious for small cell lung cancer. There is mild mass effect on the right side of the trachea. Right hilar and azygoesophageal recess nodes measure up to 2.0 cm in short axis, concerning for metastatic disease. No pericardial effusion is identified. Scattered prominent anterior mediastinal nodes are seen, measuring up to 1.7 cm in short axis. Lungs/Pleura: Bibasilar atelectasis is noted. No pleural effusion or pneumothorax is seen. No pulmonary nodules are identified. Upper Abdomen: The visualized portions of the liver and spleen are grossly unremarkable. Musculoskeletal: No acute osseous abnormalities are  identified. The visualized musculature is unremarkable in appearance. Review of the MIP images confirms the above findings. IMPRESSION: 1. No evidence of pulmonary embolus. 2. 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 Mild mass-effect on the right side of the trachea. 3. Right hilar and azygoesophageal recess nodes measure up to 2.0 cm in short axis. Prominent anterior mediastinal nodes measure up to 1.7 cm in short axis. These are concerning for metastatic disease. 4. Bibasilar atelectasis noted. These results were called by telephone at the time of interpretation on 12/26/2015 at 5:37 am to [place] PA, who verbally acknowledged these results. Electronically Signed   By: Garald Balding M.D.   On: 12/26/2015 05:40   Mr Jeri Cos ML Contrast  Result Date: 12/26/2015 CLINICAL DATA:  Lung cancer. Headache. Assess for metastatic disease. EXAM: MRI HEAD WITHOUT AND WITH CONTRAST TECHNIQUE: Multiplanar, multiecho pulse sequences of the brain and surrounding structures were obtained without and with intravenous contrast. CONTRAST:  78m MULTIHANCE GADOBENATE DIMEGLUMINE 529 MG/ML IV SOLN COMPARISON:  CT 05/30/2015 FINDINGS: Brain: The brain has normal appearance without evidence of malformation, atrophy, old or acute small or large vessel infarction, hemorrhage, hydrocephalus or extra-axial collection. No pituitary abnormality. After contrast administration, no abnormal enhancement occurs. Vascular: Major vessels at the base of the brain show flow. Skull and upper cervical spine: Normal Sinuses/Orbits: Clear/ normal. Other: None significant. IMPRESSION: Normal examination. No evidence of metastatic disease. No other finding to explain headache. Electronically Signed   By: MNelson ChimesM.D.   On: 12/26/2015 11:27   Ct Abdomen  Pelvis W Contrast  Addendum Date: 12/27/2015   ADDENDUM REPORT: 12/27/2015 14:12 ADDENDUM: There is questionable sludge within the gallbladder. It may be prudent to consider ultrasound of the right upper quadrant given questionable sludge in the gallbladder as well as prominence of the common bile duct. Electronically Signed   By: WLowella GripIII M.D.   On: 12/27/2015 14:12   Result Date: 12/27/2015 CLINICAL DATA:  Chest adenopathy with concern for metastatic focus EXAM: CT ABDOMEN AND PELVIS WITH CONTRAST TECHNIQUE: Multidetector CT imaging of the abdomen and pelvis was performed using the standard protocol following bolus administration of intravenous contrast. Oral  contrast was also administered. CONTRAST:  111m ISOVUE-300 IOPAMIDOL (ISOVUE-300) INJECTION 61% COMPARISON:  CT abdomen and pelvis May 18, 2010; chest CT December 26, 2015 FINDINGS: Lower chest: Focal opacity in the posterior aspect of the superior segment right lower lobe is increased from 1 day prior and is consistent with either increased atelectasis or new focus infiltrate in this area. Lung bases otherwise are clear. Hepatobiliary: No focal liver lesions are evident. There is no appreciable gallbladder wall thickening. There is prominence of the common bile duct 10 mm without mass or calculus evident in the common bile duct on this study. Pancreas: There is no pancreatic mass or fluid collection. Spleen: No splenic lesions are evident. Adrenals/Urinary Tract: There is a 1.1 x 0.9 cm mass in the left adrenal. Right adrenal appears normal. Kidneys bilaterally show no demonstrable mass or hydronephrosis on either side. There is no renal or ureteral calculus on either side. Urinary bladder is midline with wall thickness within normal limits. Stomach/Bowel: There is fairly diffuse stool throughout colon. There is no bowel wall or mesenteric thickening. No bowel obstruction. No free air or portal venous air. Vascular/Lymphatic: There is  no abdominal aortic aneurysm. No vascular lesions are evident. There are a few scattered subcentimeter lymph nodes in the mesentery. By size criteria, there is no adenopathy in the abdomen or pelvis. Reproductive: Uterus is retroverted. There is no pelvic mass or pelvic fluid collection. Other: Appendix appears unremarkable. There is no ascites or abscess in the abdomen or pelvis. There is a small ventral hernia containing only fat. No omental lesions are demonstrable. Musculoskeletal: There are foci of degenerative change in the lumbar spine. There are no blastic or lytic bone lesions. There is no intramuscular or abdominal wall lesion evident. IMPRESSION: Small mass in left adrenal. A small metastasis must be of concern in this region given the findings on recent chest CT. No adenopathy is apparent in the abdomen or pelvis. There are scattered subcentimeter mesenteric lymph nodes which do not meet size criteria for pathologic significance. No liver lesions are evident. No inflammatory foci are appreciated. No bowel obstruction. No abscess. No renal or ureteral calculus. No hydronephrosis. There is a small ventral hernia containing only fat. There is prominence of the common bile duct without mass or calculus evident. Significance of this finding is uncertain. Electronically Signed: By: WLowella GripIII M.D. On: 12/27/2015 14:08   [place]opsy  Result Date: 12/29/2015 EXAM: Ultrasound-guided core biopsy of the liver Interventional Radiologist:  HCriselda Peaches MD MEDICATIONS: None. ANESTHESIA/SEDATION: Fentanyl  mcg IV; Versed  mg IV Moderate Sedation Time: The patient was continuously monitored during the procedure by the interventional radiology nurse under my direct supervision. FLUOROSCOPY TIME:  Fluoroscopy Time:  minutes  seconds ( mGy). COMPLICATIONS: None immediate. PROCEDURE: Informed consent was obtained from the patient following explanation of the procedure, risks, benefits and alternatives.  The patient understands, agrees and consents for the procedure. All questions were addressed. A time out was performed. The right upper quadrant was interrogated with ultrasound. A relatively avascular plane of the liver was identified. A suitable skin entry site was selected and marked. The region was then sterilely prepped and draped in standard fashion using chlorhexidine skin prep. Local anesthesia was attained by infiltration with 1% lidocaine. A small dermatotomy was made. Under real-time sonographic guidance, a 17 gauge trocar needle was advanced into the liver. Multiple 18 gauge core biopsies were then coaxially obtained. Needle placement was confirmed on all biopsy passes with real-time  sonography. Biopsy specimens were placed in formalin and delivered to pathology for further analysis. As the introducer needle was removed, the biopsy tract was embolized with a Gel-Foam slurry. Post biopsy ultrasound imaging demonstrates no active bleeding or perihepatic hematoma. The patient tolerated the procedure well. IMPRESSION: Technically successful ultrasound-guided random core biopsy of the liver. Electronically Signed   By: Jacqulynn Cadet M.D.   On: 12/29/2015 13:58   Dg Foot 2 Views Right  Result Date: 12/28/2015 CLINICAL DATA:  50 year old female with a history of right great toe pain EXAM: RIGHT FOOT - 2 VIEW COMPARISON:  None. FINDINGS: No displaced fracture identified.  No radiopaque foreign body. Degenerative changes of the interphalangeal joints. Minimal degenerative changes of the metatarsal-phalangeal joints. Degenerative changes of the midfoot and hindfoot. No focal soft tissue swelling. IMPRESSION: Negative for acute bony abnormality. Signed, Dulcy Fanny. Earleen Newport, DO [facility] Electronically Signed   By: [name] D.O.   On: 12/28/2015 16:51   Dg Swallowing Func-speech Pathology  Result Date: 12/30/2015 Objective Swallowing Evaluation:  Type of Study: MBS-Modified Barium Swallow Study Patient Details Name: WAKEELAH SOLAN MRN: [id number] Date of Birth: May 10, 1965 Today's Date: 12/30/2015 Time: SLP Start Time (ACUTE ONLY): 1405-SLP Stop Time (ACUTE ONLY): 1420 SLP Time Calculation (min) (ACUTE ONLY): 15 min Past Medical History: Past Medical History: Diagnosis Date . Asthma  . Diabetes mellitus  . Hypercholesteremia  . Hypertension  . Obese  Past Surgical History: Past Surgical History: Procedure Laterality Date . CESAREAN SECTION   . TUBAL LIGATION   HPI: Payslie Mccaig  is a 50 y.o. female, Asked medical history of Asthma, insulin-dependent diabetes, high cholesterol, hypertension, obesity, presents to ED with complaints of chest pain, right-sided, intermittent, progressive over last few month, accompanied by shortness of breath, she denies any weight loss, any cough, any smoking history, any secondhand smoking, CT chest with contrast significant for large paratracheal mass, surrounding trachea and right bronchus, patient tachycardic on admission. Subjective: "I have to turn my head to swallow." Assessment / Plan / Recommendation CHL IP CLINICAL IMPRESSIONS 12/30/2015 Therapy Diagnosis WFL;Suspected primary esophageal dysphagia Clinical Impression Pt seen for followup MBSS after concerns for possible aspiration noted at bedside. However, pt tolerated all consistencies WFL. Pt reported globus sensation with all solids and barium tablet- indicating the upper esophagus, however complete clearance was noted. Pt did continue to demonstrate intermittent L head turn with solids, however no transient slowing of the bolus passage noted. Question if presence of paratracheal mass is interfering with esophageal sensation. No further SLP indicated at this time. Pt verbalized understanding of the results and recommendations.   Impact on safety and function No limitations   CHL IP TREATMENT RECOMMENDATION 12/30/2015 Treatment Recommendations No treatment recommended at  this time   Prognosis 12/30/2015 Prognosis for Safe Diet Advancement Good Barriers to Reach Goals -- Barriers/Prognosis Comment -- CHL IP DIET RECOMMENDATION 12/30/2015 SLP Diet Recommendations Regular solids;Thin liquid Liquid Administration via Cup;Straw Medication Administration Whole meds with liquid Compensations Small sips/bites;Follow solids with liquid Postural Changes Seated upright at 90 degrees   CHL IP OTHER RECOMMENDATIONS 12/30/2015 Recommended Consults -- Oral Care Recommendations Oral care BID Other Recommendations --   CHL IP FOLLOW UP RECOMMENDATIONS 12/30/2015 Follow up Recommendations None   No flowsheet data found.     CHL IP ORAL PHASE 12/30/2015 Oral Phase WFL Oral - Pudding Teaspoon -- Oral - Pudding Cup -- Oral - Honey Teaspoon -- Oral - Honey Cup -- Oral - Nectar  Teaspoon -- Oral - Nectar Cup -- Oral - Nectar Straw -- Oral - Thin Teaspoon -- Oral - Thin Cup -- Oral - Thin Straw -- Oral - Puree -- Oral - Mech Soft -- Oral - Regular -- Oral - Multi-Consistency -- Oral - Pill -- Oral Phase - Comment --  CHL IP PHARYNGEAL PHASE 12/30/2015 Pharyngeal Phase WFL Pharyngeal- Pudding Teaspoon -- Pharyngeal -- Pharyngeal- Pudding Cup -- Pharyngeal -- Pharyngeal- Honey Teaspoon -- Pharyngeal -- Pharyngeal- Honey Cup -- Pharyngeal -- Pharyngeal- Nectar Teaspoon -- Pharyngeal -- Pharyngeal- Nectar Cup -- Pharyngeal -- Pharyngeal- Nectar Straw -- Pharyngeal -- Pharyngeal- Thin Teaspoon -- Pharyngeal -- Pharyngeal- Thin Cup -- Pharyngeal -- Pharyngeal- Thin Straw -- Pharyngeal -- Pharyngeal- Puree -- Pharyngeal -- Pharyngeal- Mechanical Soft -- Pharyngeal -- Pharyngeal- Regular -- Pharyngeal -- Pharyngeal- Multi-consistency -- Pharyngeal -- Pharyngeal- Pill -- Pharyngeal -- Pharyngeal Comment --  CHL IP CERVICAL ESOPHAGEAL PHASE 12/30/2015 Cervical Esophageal Phase WFL Pudding Teaspoon -- Pudding Cup -- Honey Teaspoon -- Honey Cup -- Nectar Teaspoon -- Nectar Cup -- Nectar Straw -- Thin Teaspoon -- Thin Cup --  Thin Straw -- Puree -- Mechanical Soft -- Regular -- Multi-consistency -- Pill -- Cervical Esophageal Comment -- No flowsheet data found. Vinetta Bergamo MA, [place] Pager [phone] 12/30/2015, 2:55 PM              US Abdomen Limited Ruq  Result Date: 12/28/2015 CLINICAL DATA:  Right upper quadrant pain EXAM: US ABDOMEN LIMITED - RIGHT UPPER QUADRANT COMPARISON:  CT abdomen pelvis dated 12/27/2015 FINDINGS: Gallbladder: Contracted gallbladder with suspected 9 mm gallstone. No gallbladder wall thickening or pericholecystic fluid. Negative sonographic Murphy's sign. Common bile duct: Diameter: 7 mm centrally. Liver: Coarse hepatic echotexture with hyperechoic hepatic parenchyma. No focal hepatic lesion is seen. IMPRESSION: Cholelithiasis, without associated sonographic findings to suggest acute cholecystitis. Common duct measures 7 mm centrally, perhaps mildly prominent. However, in the absence of abnormal LFTs, this is of dubious clinical significance. Coarse hepatic echotexture with hyperechoic hepatic parenchyma, nonspecific, but raising the possibility of hepatic steatosis. Electronically Signed   By: Julian Hy M.D.   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 Noted to have a small mass in the left adrenal ?metastasis vs incidentaloma. Patient has been a lifelong nonsmoker  MRI of the brain was negative for any metastatic disease. Plan --I discussed the diagnosis, prognosis, natural history and treatment options with the patient. -PET/CT on 10/9 to complete accurate staging and evaluate left adrenal lesion -patient is young and would give her the  benefit of doubt and treat this as Stage IIIB disease with concurrent carboplatin/Taxol chemotherapy and RT with potentially curative intent. -if adrenal lesions FDG avid and concerning for mets - Rad onc considering SBRT to the lesion. -we will plan to do 2 additional cycles of carboplatin + Taxol after completion of RT -if stable disease/response after these treatment would consider Durvalumab. -discussed with Dr Virgina Jock will get foundation one testing and PDL1 testing on the patients tumor to define additional treatment options as needed. -chemo-counseling -IR port placement -continue f/u with radiation oncology.  #2 Sinus tachycardia - on BB on discharge from hospital.  #3 neoplasm related  pain is currently controlled without significant medications. Clinically likely has sleep apnea . Would need to be careful with sedative medications . #3 hypertension   #4 diabetes #5 dyslipidemia #6 asthma Plan Mx per PCP   RTC with Dr Irene Limbo 1 week after starting chemotherapy for a toxicity check with labs.   to All of the patients questions were answered with apparent satisfaction. The patient knows to call the clinic with any problems, questions or concerns.  I spent 40 minutes counseling the patient face to face. The total time spent in the appointment was 50 minutes and more than 50% was on counseling and direct patient cares.    Sullivan Lone MD [place] AAHIVMS [facility] Hematology/Oncology Physician [facility]  (Office):       [phone] (Work cell):  [phone] (Fax):           [phone]  01/05/2016 4:34 PM

## 2016-01-06 ENCOUNTER — Ambulatory Visit
Admission: RE | Admit: 2016-01-06 | Discharge: 2016-01-06 | Disposition: A | Discharge status: Home / Self Care | Payer: BC Managed Care – PPO | Source: Ambulatory Visit | Attending: Radiation Oncology | Admitting: Radiation Oncology

## 2016-01-06 DIAGNOSIS — Z510 Encounter for antineoplastic radiation therapy: Secondary | ICD-10-CM | POA: Diagnosis not present

## 2016-01-07 ENCOUNTER — Other Ambulatory Visit: Payer: Self-pay | Admitting: Radiology

## 2016-01-07 ENCOUNTER — Other Ambulatory Visit: Payer: BC Managed Care – PPO

## 2016-01-07 ENCOUNTER — Encounter: Payer: Self-pay | Admitting: Hematology

## 2016-01-07 ENCOUNTER — Other Ambulatory Visit: Payer: Self-pay | Admitting: *Deleted

## 2016-01-07 ENCOUNTER — Ambulatory Visit
Admission: RE | Admit: 2016-01-07 | Discharge: 2016-01-07 | Disposition: A | Discharge status: Home / Self Care | Payer: BC Managed Care – PPO | Source: Ambulatory Visit | Attending: Radiation Oncology | Admitting: Radiation Oncology

## 2016-01-07 ENCOUNTER — Telehealth: Payer: Self-pay | Admitting: Hematology

## 2016-01-07 ENCOUNTER — Encounter: Payer: Self-pay | Admitting: *Deleted

## 2016-01-07 DIAGNOSIS — Z510 Encounter for antineoplastic radiation therapy: Secondary | ICD-10-CM | POA: Diagnosis not present

## 2016-01-07 NOTE — Progress Notes (Signed)
After reviewing pt's treatment plan there aren't any foundations offering copay assistance for her Dx.  Since pt will be receiving radiation treatment, I emailed Jenny Reichmann and [employer] requesting they reach out to the pt regarding the [place] to assist w/ gas cards.

## 2016-01-07 NOTE — Telephone Encounter (Signed)
Appointments complete per 10/2 los. Left message for patient re next appointment for 10/6 and also added comment to 10/5 xrt appointment to send patient to scheduling to confirm 10/6 tx start.

## 2016-01-08 ENCOUNTER — Other Ambulatory Visit: Payer: Self-pay | Admitting: General Surgery

## 2016-01-08 ENCOUNTER — Encounter (HOSPITAL_COMMUNITY): Payer: Self-pay

## 2016-01-08 ENCOUNTER — Encounter: Payer: Self-pay | Admitting: *Deleted

## 2016-01-08 ENCOUNTER — Other Ambulatory Visit: Payer: Self-pay | Admitting: Hematology

## 2016-01-08 ENCOUNTER — Encounter (HOSPITAL_COMMUNITY)
Admission: RE | Admit: 2016-01-08 | Discharge: 2016-01-08 | Disposition: A | Discharge status: Home / Self Care | Payer: BC Managed Care – PPO | Source: Ambulatory Visit | Attending: Hematology | Admitting: Hematology

## 2016-01-08 ENCOUNTER — Encounter: Payer: Self-pay | Admitting: Radiation Oncology

## 2016-01-08 ENCOUNTER — Ambulatory Visit
Admission: RE | Admit: 2016-01-08 | Discharge: 2016-01-08 | Disposition: A | Discharge status: Home / Self Care | Payer: BC Managed Care – PPO | Source: Ambulatory Visit | Attending: Radiation Oncology | Admitting: Radiation Oncology

## 2016-01-08 ENCOUNTER — Ambulatory Visit (HOSPITAL_COMMUNITY)
Admission: RE | Admit: 2016-01-08 | Discharge: 2016-01-08 | Disposition: A | Discharge status: Home / Self Care | Payer: BC Managed Care – PPO | Source: Ambulatory Visit | Attending: Hematology | Admitting: Hematology

## 2016-01-08 ENCOUNTER — Telehealth: Payer: Self-pay | Admitting: *Deleted

## 2016-01-08 DIAGNOSIS — C3490 Malignant neoplasm of unspecified part of unspecified bronchus or lung: Secondary | ICD-10-CM | POA: Insufficient documentation

## 2016-01-08 DIAGNOSIS — J45909 Unspecified asthma, uncomplicated: Secondary | ICD-10-CM | POA: Diagnosis not present

## 2016-01-08 DIAGNOSIS — I10 Essential (primary) hypertension: Secondary | ICD-10-CM | POA: Diagnosis not present

## 2016-01-08 DIAGNOSIS — Z6841 Body Mass Index (BMI) 40.0 and over, adult: Secondary | ICD-10-CM | POA: Diagnosis not present

## 2016-01-08 DIAGNOSIS — E669 Obesity, unspecified: Secondary | ICD-10-CM | POA: Diagnosis not present

## 2016-01-08 DIAGNOSIS — Z510 Encounter for antineoplastic radiation therapy: Secondary | ICD-10-CM | POA: Diagnosis not present

## 2016-01-08 DIAGNOSIS — C3491 Malignant neoplasm of unspecified part of right bronchus or lung: Secondary | ICD-10-CM

## 2016-01-08 DIAGNOSIS — E1140 Type 2 diabetes mellitus with diabetic neuropathy, unspecified: Secondary | ICD-10-CM | POA: Diagnosis not present

## 2016-01-08 DIAGNOSIS — Z9221 Personal history of antineoplastic chemotherapy: Secondary | ICD-10-CM | POA: Insufficient documentation

## 2016-01-08 DIAGNOSIS — E7800 Pure hypercholesterolemia, unspecified: Secondary | ICD-10-CM | POA: Diagnosis not present

## 2016-01-08 HISTORY — PX: IR GENERIC HISTORICAL: IMG1180011

## 2016-01-08 LAB — PROTIME-INR
INR: 1.06
Prothrombin Time: 13.9 seconds (ref 11.4–15.2)

## 2016-01-08 LAB — CBC WITH DIFFERENTIAL/PLATELET
BASOS PCT: 0 %
Basophils Absolute: 0 10*3/uL (ref 0.0–0.1)
Eosinophils Absolute: 0.1 10*3/uL (ref 0.0–0.7)
Eosinophils Relative: 0 %
HEMATOCRIT: 31.6 % — AB (ref 36.0–46.0)
HEMOGLOBIN: 10.3 g/dL — AB (ref 12.0–15.0)
LYMPHS ABS: 2 10*3/uL (ref 0.7–4.0)
LYMPHS PCT: 17 %
MCH: 26.4 pg (ref 26.0–34.0)
MCHC: 32.6 g/dL (ref 30.0–36.0)
MCV: 81 fL (ref 78.0–100.0)
MONO ABS: 0.9 10*3/uL (ref 0.1–1.0)
MONOS PCT: 8 %
NEUTROS ABS: 8.8 10*3/uL — AB (ref 1.7–7.7)
Neutrophils Relative %: 75 %
Platelets: 423 10*3/uL — ABNORMAL HIGH (ref 150–400)
RBC: 3.9 MIL/uL (ref 3.87–5.11)
RDW: 12.7 % (ref 11.5–15.5)
WBC: 11.8 10*3/uL — ABNORMAL HIGH (ref 4.0–10.5)

## 2016-01-08 LAB — BASIC METABOLIC PANEL
ANION GAP: 8 (ref 5–15)
BUN: 8 mg/dL (ref 6–20)
CALCIUM: 8.9 mg/dL (ref 8.9–10.3)
CHLORIDE: 103 mmol/L (ref 101–111)
CO2: 25 mmol/L (ref 22–32)
Creatinine, Ser: 0.58 mg/dL (ref 0.44–1.00)
GFR calc Af Amer: >60 mL/min (ref >60)
GFR calc non Af Amer: >60 mL/min (ref >60)
GLUCOSE: 185 mg/dL — AB (ref 65–99)
Potassium: 3.8 mmol/L (ref 3.5–5.1)
Sodium: 136 mmol/L (ref 135–145)

## 2016-01-08 LAB — HCG, SERUM, QUALITATIVE: PREG SERUM: NEGATIVE

## 2016-01-08 LAB — GLUCOSE, CAPILLARY: Glucose-Capillary: 203 mg/dL — ABNORMAL HIGH (ref 65–99)

## 2016-01-08 LAB — APTT: aPTT: 34 seconds (ref 24–36)

## 2016-01-08 MED ORDER — HEPARIN SOD (PORK) LOCK FLUSH 100 UNIT/ML IV SOLN
INTRAVENOUS | Status: AC
  Filled 2016-01-08: qty 5

## 2016-01-08 MED ORDER — METOPROLOL TARTRATE 25 MG PO TABS
25.0000 mg | ORAL_TABLET | ORAL | Status: AC
  Administered 2016-01-08: 25 mg via ORAL
  Filled 2016-01-08: qty 1

## 2016-01-08 MED ORDER — LIDOCAINE HCL 1 % IJ SOLN
INTRAMUSCULAR | Status: DC | PRN
  Administered 2016-01-08 (×2): 5 mL via INTRADERMAL

## 2016-01-08 MED ORDER — SODIUM CHLORIDE 0.9 % IV SOLN
INTRAVENOUS | Status: DC
  Administered 2016-01-08: 13:00:00 via INTRAVENOUS

## 2016-01-08 MED ORDER — HEPARIN SOD (PORK) LOCK FLUSH 100 UNIT/ML IV SOLN
INTRAVENOUS | Status: DC | PRN
  Administered 2016-01-08: 500 [IU]

## 2016-01-08 MED ORDER — DEXTROSE 5 % IV SOLN
3.0000 g | INTRAVENOUS | Status: DC
  Filled 2016-01-08: qty 3000

## 2016-01-08 MED ORDER — LIDOCAINE HCL 1 % IJ SOLN
INTRAMUSCULAR | Status: AC
  Filled 2016-01-08: qty 20

## 2016-01-08 NOTE — Progress Notes (Signed)
Called patient at home--explained to her about the grant  we have available--she is going to bring in a couple of check stubs--will need to evaluate for [place]

## 2016-01-08 NOTE — Progress Notes (Signed)
Pt had piccline placed today in IR.  Informed by IR that pt was discharged from IR department after piccline placement.  Pt did not return to short stay.

## 2016-01-08 NOTE — Telephone Encounter (Signed)
Oncology Nurse Navigator Documentation  Oncology Nurse Navigator Flowsheets 01/08/2016  Navigator Location [facility]  Navigator Encounter Type Telephone/I received a message from CSW that patient was in need resources.  I called patient.  I left vm message with my name and that I will reach out and call her tomorrow.    Telephone Outgoing Call  Treatment Phase Treatment  Acuity Level 1  Time Spent with Patient 15

## 2016-01-08 NOTE — Procedures (Signed)
Interventional Radiology Procedure Note  Procedure: Placement of a right upper extremity, brachial vein PICC. 43cm.  SL, power injectable.  Tip is positioned at the superior cavoatrial junction and catheter is ready for immediate use.  Complications: None Recommendations:  - Ok to shower tomorrow - Do not submerge - Routine line care   Signed,  Dulcy Fanny. Earleen Newport, DO

## 2016-01-08 NOTE — Progress Notes (Signed)
Referring Physician(s): [name]  Supervising Physician: [name]  Patient Status:  Outpatient  Chief Complaint:  "I'm here for a port a cath"  Subjective: Patient familiar to IR service from prior right supraclavicular lymph node biopsy on 12/29/15. She has a history of recently diagnosed metastatic non-small cell lung cancer and presents today for Port-A-Cath placement for chemotherapy. She currently denies fever, abdominal/back pain, vomiting or abnormal bleeding. She does have chest discomfort, occasional HA,  dyspnea, occasional cough, fatigue and intermittent nausea. Additional history as below. Past Medical History:  Diagnosis Date  . Asthma   . Diabetes mellitus   . Hypercholesteremia   . Hypertension   . Lung cancer ([place])   . Neuropathy ([place])   . Obese    Past Surgical History:  Procedure Laterality Date  . CESAREAN SECTION    . TUBAL LIGATION    . US guided core needle biopsy     of right lower neck/supraclavicular lymph nodes     Allergies: Review of patient's allergies indicates no known allergies.  Medications: Prior to Admission medications   Medication Sig Start Date End Date Taking? Authorizing Provider  chlorhexidine (PERIDEX) 0.12 % solution Use as directed 15 mLs in the mouth or throat 2 (two) times daily. 01/05/16  Yes Gautam Juleen China, MD  dexamethasone (DECADRON) 4 MG tablet Take 2 tablets (8 mg total) by mouth daily. Start the day after chemotherapy for 2 days. 01/01/16  Yes [name], MD  HYDROcodone-acetaminophen (HYCET) 7.5-325 mg/15 ml solution Take 15 mLs by mouth 4 (four) times daily as needed for moderate pain. 12/31/15 12/30/16 Yes Hayden Pedro, PA-C  Insulin Degludec (TRESIBA FLEXTOUCH) 200 UNIT/ML SOPN Inject 100 Units into the skin daily before breakfast.    Yes Historical Provider, MD  metoprolol tartrate (LOPRESSOR) 25 MG tablet Take 1 tablet (25 mg total) by mouth 2 (two) times daily. 12/30/15  Yes  Florencia Reasons, MD  ondansetron (ZOFRAN) 8 MG tablet Take 1 tablet (8 mg total) by mouth 2 (two) times daily as needed for refractory nausea / vomiting. Start on day 3 after chemo. 01/01/16  Yes Gautam Juleen China, MD  prochlorperazine (COMPAZINE) 10 MG tablet Take 1 tablet (10 mg total) by mouth every 6 (six) hours as needed (Nausea or vomiting). 01/01/16  Yes [name], MD  lidocaine-prilocaine (EMLA) cream Apply to affected area once 01/01/16   [name], MD     Vital Signs: BP (!) 147/81   Pulse (!) 144   Temp 97.9 F (36.6 C) (Oral)   Resp (!) 22   Wt 283 lb (128.4 kg)   LMP 12/19/2015   SpO2 99%   BMI 41.79 kg/m   Physical Exam awake, alert. Chest with slightly diminished breath sounds right base, left clear. Heart tachycardic but regular rhythm. Abdomen obese, soft, positive bowel sounds, nontender. Lower extremities with no significant edema.  Imaging: No results found.  Labs:  CBC:  Recent Labs  12/25/15 2016 12/27/15 0156 12/28/15 0214 12/29/15 0435  WBC 8.9 7.6 7.6 8.0  HGB 10.3* 9.7* 9.9* 10.4*  HCT 33.7* 31.5* 31.9* 33.4*  PLT 377 337 361 346    COAGS:  Recent Labs  12/28/15 0214  INR 1.08    BMP:  Recent Labs  12/27/15 0156 12/28/15 0214 12/29/15 0435 12/30/15 0303  NA 139 138 136 137  K 3.8 3.8 3.8 3.7  CL 104 104 104 103  CO2 '26 27 26 26  '$ GLUCOSE 258* 256*  214* 291*  BUN 6 6 <5* 7  CALCIUM 8.7* 8.9 8.7* 9.1  CREATININE 0.58 0.58 0.55 0.55  GFRNONAA >60 >60 >60 >60  GFRAA >60 >60 >60 >60    LIVER FUNCTION TESTS:  Recent Labs  05/23/15 2025 12/28/15 0214  BILITOT 0.1* 0.2*  AST 18 14*  ALT 15 12*  ALKPHOS 78 77  PROT 7.0 7.1  ALBUMIN 3.1* 3.1*    Assessment and Plan: Pt with history of recently diagnosed metastatic non-small cell lung cancer ; she presents today for Port-A-Cath placement for chemotherapy.Risks and benefits discussed with the patient /family including, but not limited to bleeding, infection,  pneumothorax, or fibrin sheath development and need for additional procedures.All of thepatient's questions were answered, patient is agreeable to proceed.Consent signed and in chart. Patient's heart rate continues to be elevated currently at 144. She had final negative troponin last month and nl TSH. She is on metoprolol twice daily and has not taken dose today therefore we'll administer prior to procedure and await decision by Dr. Earleen Newport whether to proceed with case.  After d/w Drs. Wagner/Kale decision made to proceed with PICC placement for now and if pt's HR should normalize with treatment can consider port placement at later date.  Electronically Signed: D. Rowe Robert 01/08/2016, 1:36 PM   I spent a total of 20 minutes at the the patient's bedside AND on the patient's hospital floor or unit, greater than 50% of which was counseling/coordinating care for port a cath placement    Patient ID: Marilyn [name], female   DOB: 07-04-1965, 50 y.o.   MRN: [id number]

## 2016-01-08 NOTE — Progress Notes (Signed)
[place] Work  Clinical Social Work was referred by nurse for assessment of psychosocial needs.  Clinical Social Worker made several attempts to meet patient at [facility] at radiation, but missed her due to patient coming early. CSW phoned her at home to offer support and assess for needs. Pt coughing a lot through out call, she appeared to have difficulty talking. CSW reviewed role of CSW team/Pt and Family Support team.   Pt not able to work due to her illness, very limited income. This is her biggest concern currently. CSW educated her about financial advocates and made referral to [place] advocates on her behalf. Pt open to support and assistance. CSW will follow and see in person at future appt. CSW informed pt no availability of CSW team this Friday, but CSW to follow up next week. CSW also to refer to counseling intern for additional support.    Clinical Social Work interventions: Supportive Psychiatric nurse education and referral  Loren Racer, [place] Worker [place]  [place] Phone: [phone] Fax: [phone]

## 2016-01-09 ENCOUNTER — Ambulatory Visit
Admission: RE | Admit: 2016-01-09 | Discharge: 2016-01-09 | Disposition: A | Discharge status: Home / Self Care | Payer: BC Managed Care – PPO | Source: Ambulatory Visit | Attending: Radiation Oncology | Admitting: Radiation Oncology

## 2016-01-09 ENCOUNTER — Encounter: Payer: Self-pay | Admitting: Radiation Oncology

## 2016-01-09 ENCOUNTER — Ambulatory Visit (HOSPITAL_BASED_OUTPATIENT_CLINIC_OR_DEPARTMENT_OTHER): Payer: BC Managed Care – PPO

## 2016-01-09 VITALS — BP 133/85 | HR 147 | Temp 99.0°F | Resp 24

## 2016-01-09 VITALS — BP 112/77 | HR 65 | Temp 99.3°F | Resp 20 | Ht 69.0 in | Wt 283.4 lb

## 2016-01-09 DIAGNOSIS — C3491 Malignant neoplasm of unspecified part of right bronchus or lung: Secondary | ICD-10-CM | POA: Insufficient documentation

## 2016-01-09 DIAGNOSIS — Z794 Long term (current) use of insulin: Secondary | ICD-10-CM | POA: Insufficient documentation

## 2016-01-09 DIAGNOSIS — C3492 Malignant neoplasm of unspecified part of left bronchus or lung: Secondary | ICD-10-CM

## 2016-01-09 DIAGNOSIS — Z79899 Other long term (current) drug therapy: Secondary | ICD-10-CM | POA: Diagnosis not present

## 2016-01-09 DIAGNOSIS — Z5111 Encounter for antineoplastic chemotherapy: Secondary | ICD-10-CM

## 2016-01-09 DIAGNOSIS — Z510 Encounter for antineoplastic radiation therapy: Secondary | ICD-10-CM | POA: Diagnosis not present

## 2016-01-09 DIAGNOSIS — C3490 Malignant neoplasm of unspecified part of unspecified bronchus or lung: Secondary | ICD-10-CM | POA: Diagnosis not present

## 2016-01-09 MED ORDER — SODIUM CHLORIDE 0.9 % IV SOLN
Freq: Once | INTRAVENOUS | Status: AC
  Administered 2016-01-09: 13:00:00 via INTRAVENOUS

## 2016-01-09 MED ORDER — DIPHENHYDRAMINE HCL 50 MG/ML IJ SOLN
50.0000 mg | Freq: Once | INTRAMUSCULAR | Status: AC
  Administered 2016-01-09: 50 mg via INTRAVENOUS

## 2016-01-09 MED ORDER — SODIUM CHLORIDE 0.9% FLUSH
10.0000 mL | INTRAVENOUS | Status: DC | PRN
Start: 2016-01-09 — End: 2016-01-09
  Administered 2016-01-09: 10 mL
  Filled 2016-01-09: qty 10

## 2016-01-09 MED ORDER — SODIUM CHLORIDE 0.9 % IV SOLN
45.0000 mg/m2 | Freq: Once | INTRAVENOUS | Status: AC
  Administered 2016-01-09: 114 mg via INTRAVENOUS
  Filled 2016-01-09: qty 19

## 2016-01-09 MED ORDER — FAMOTIDINE IN NACL 20-0.9 MG/50ML-% IV SOLN
INTRAVENOUS | Status: AC
  Filled 2016-01-09: qty 50

## 2016-01-09 MED ORDER — DIPHENHYDRAMINE HCL 50 MG/ML IJ SOLN
INTRAMUSCULAR | Status: AC
  Filled 2016-01-09: qty 1

## 2016-01-09 MED ORDER — PALONOSETRON HCL INJECTION 0.25 MG/5ML
0.2500 mg | Freq: Once | INTRAVENOUS | Status: AC
  Administered 2016-01-09: 0.25 mg via INTRAVENOUS

## 2016-01-09 MED ORDER — SODIUM CHLORIDE 0.9 % IV SOLN
Freq: Once | INTRAVENOUS | Status: AC
  Administered 2016-01-09: 14:00:00 via INTRAVENOUS

## 2016-01-09 MED ORDER — CARBOPLATIN CHEMO INJECTION 450 MG/45ML
300.0000 mg | Freq: Once | INTRAVENOUS | Status: AC
  Administered 2016-01-09: 300 mg via INTRAVENOUS
  Filled 2016-01-09: qty 30

## 2016-01-09 MED ORDER — HEPARIN SOD (PORK) LOCK FLUSH 100 UNIT/ML IV SOLN
500.0000 [IU] | Freq: Once | INTRAVENOUS | Status: AC | PRN
  Administered 2016-01-09: 250 [IU]
  Filled 2016-01-09: qty 5

## 2016-01-09 MED ORDER — SODIUM CHLORIDE 0.9 % IV SOLN
20.0000 mg | Freq: Once | INTRAVENOUS | Status: AC
  Administered 2016-01-09: 20 mg via INTRAVENOUS
  Filled 2016-01-09: qty 2

## 2016-01-09 MED ORDER — SONAFINE EX EMUL
1.0000 "application " | Freq: Two times a day (BID) | CUTANEOUS | Status: DC
  Administered 2016-01-09: 1 via TOPICAL

## 2016-01-09 MED ORDER — FAMOTIDINE IN NACL 20-0.9 MG/50ML-% IV SOLN
20.0000 mg | Freq: Once | INTRAVENOUS | Status: AC
  Administered 2016-01-09: 20 mg via INTRAVENOUS

## 2016-01-09 MED ORDER — PALONOSETRON HCL INJECTION 0.25 MG/5ML
INTRAVENOUS | Status: AC
  Filled 2016-01-09: qty 5

## 2016-01-09 NOTE — Progress Notes (Addendum)
Mrs. Bricco has received 5 fractions to her right lung field.  Skin to right lung field with normal color.  Denies any problems with swallowing.  Coughing non productive and wheezing with SOB.  Appetite has been decreased. Pain to right area Picc site 4/10 taking Hydrocodone. Picc inserted on yesterday, no signs of infection. Patient teaching done see education for documentation.  Sonafine given with instructions. Wt Readings from Last 3 Encounters:  01/09/16 283 lb 6.4 oz (128.5 kg)  01/08/16 283 lb (128.4 kg)  01/05/16 289 lb 9.6 oz (131.4 kg)  BP 112/77 (BP Location: Left Arm, Patient Position: Sitting, Cuff Size: Large)   Pulse 65   Temp 99.3 F (37.4 C) (Oral)   Resp 20   Ht '5\' 9"'$  (1.753 m)   Wt 283 lb 6.4 oz (128.5 kg)   LMP 12/19/2015   SpO2 100%   BMI 41.85 kg/m

## 2016-01-09 NOTE — Progress Notes (Signed)
Ok to treat using BMET from 9/24 per Dr. Irene Limbo, infusion RN, [facility] notified.  Ok to treat with HR of 140's due to pt's disease, extra fluid bolus ordered.

## 2016-01-09 NOTE — Patient Instructions (Signed)
[facility] Discharge Instructions for Patients Receiving Chemotherapy  Today you received the following chemotherapy agents Taxol and Carboplatin  To help prevent nausea and vomiting after your treatment, we encourage you to take your nausea medication as directed. No Zofran for 3 days. Take Compazine instead.   If you develop nausea and vomiting that is not controlled by your nausea medication, call the clinic.   BELOW ARE SYMPTOMS THAT SHOULD BE REPORTED IMMEDIATELY:  *FEVER GREATER THAN 100.5 F  *CHILLS WITH OR WITHOUT FEVER  NAUSEA AND VOMITING THAT IS NOT CONTROLLED WITH YOUR NAUSEA MEDICATION  *UNUSUAL SHORTNESS OF BREATH  *UNUSUAL BRUISING OR BLEEDING  TENDERNESS IN MOUTH AND THROAT WITH OR WITHOUT PRESENCE OF ULCERS  *URINARY PROBLEMS  *BOWEL PROBLEMS  UNUSUAL RASH Items with * indicate a potential emergency and should be followed up as soon as possible.  Feel free to call the clinic you have any questions or concerns. The clinic phone number is [phone].  Please show the [facility] at check-in to the Emergency Department and triage nurse.  Paclitaxel injection What is this medicine? PACLITAXEL (PAK li TAX el) is a chemotherapy drug. It targets fast dividing cells, like cancer cells, and causes these cells to die. This medicine is used to treat ovarian cancer, breast cancer, and other cancers. This medicine may be used for other purposes; ask your health care provider or pharmacist if you have questions. What should I tell my health care provider before I take this medicine? They need to know if you have any of these conditions: -blood disorders -irregular heartbeat -infection (especially a virus infection such as chickenpox, cold sores, or herpes) -liver disease -previous or ongoing radiation therapy -an unusual or allergic reaction to paclitaxel, alcohol, polyoxyethylated castor oil, other chemotherapy agents, other medicines, foods,  dyes, or preservatives -pregnant or trying to get pregnant -breast-feeding How should I use this medicine? This drug is given as an infusion into a vein. It is administered in a hospital or clinic by a specially trained health care professional. Talk to your pediatrician regarding the use of this medicine in children. Special care may be needed. Overdosage: If you think you have taken too much of this medicine contact a poison control center or emergency room at once. NOTE: This medicine is only for you. Do not share this medicine with others. What if I miss a dose? It is important not to miss your dose. Call your doctor or health care professional if you are unable to keep an appointment. What may interact with this medicine? Do not take this medicine with any of the following medications: -disulfiram -metronidazole This medicine may also interact with the following medications: -cyclosporine -diazepam -ketoconazole -medicines to increase blood counts like filgrastim, pegfilgrastim, sargramostim -other chemotherapy drugs like cisplatin, doxorubicin, epirubicin, etoposide, teniposide, vincristine -quinidine -testosterone -vaccines -verapamil Talk to your doctor or health care professional before taking any of these medicines: -acetaminophen -aspirin -ibuprofen -ketoprofen -naproxen This list may not describe all possible interactions. Give your health care provider a list of all the medicines, herbs, non-prescription drugs, or dietary supplements you use. Also tell them if you smoke, drink alcohol, or use illegal drugs. Some items may interact with your medicine. What should I watch for while using this medicine? Your condition will be monitored carefully while you are receiving this medicine. You will need important blood work done while you are taking this medicine. This drug may make you feel generally unwell. This is not uncommon,  as chemotherapy can affect healthy cells as well  as cancer cells. Report any side effects. Continue your course of treatment even though you feel ill unless your doctor tells you to stop. This medicine can cause serious allergic reactions. To reduce your risk you will need to take other medicine(s) before treatment with this medicine. In some cases, you may be given additional medicines to help with side effects. Follow all directions for their use. Call your doctor or health care professional for advice if you get a fever, chills or sore throat, or other symptoms of a cold or flu. Do not treat yourself. This drug decreases your body's ability to fight infections. Try to avoid being around people who are sick. This medicine may increase your risk to bruise or bleed. Call your doctor or health care professional if you notice any unusual bleeding. Be careful brushing and flossing your teeth or using a toothpick because you may get an infection or bleed more easily. If you have any dental work done, tell your dentist you are receiving this medicine. Avoid taking products that contain aspirin, acetaminophen, ibuprofen, naproxen, or ketoprofen unless instructed by your doctor. These medicines may hide a fever. Do not become pregnant while taking this medicine. Women should inform their doctor if they wish to become pregnant or think they might be pregnant. There is a potential for serious side effects to an unborn child. Talk to your health care professional or pharmacist for more information. Do not breast-feed an infant while taking this medicine. Men are advised not to father a child while receiving this medicine. This product may contain alcohol. Ask your pharmacist or healthcare provider if this medicine contains alcohol. Be sure to tell all healthcare providers you are taking this medicine. Certain medicines, like metronidazole and disulfiram, can cause an unpleasant reaction when taken with alcohol. The reaction includes flushing, headache, nausea,  vomiting, sweating, and increased thirst. The reaction can last from 30 minutes to several hours. What side effects may I notice from receiving this medicine? Side effects that you should report to your doctor or health care professional as soon as possible: -allergic reactions like skin rash, itching or hives, swelling of the face, lips, or tongue -low blood counts - This drug may decrease the number of white blood cells, red blood cells and platelets. You may be at increased risk for infections and bleeding. -signs of infection - fever or chills, cough, sore throat, pain or difficulty passing urine -signs of decreased platelets or bleeding - bruising, pinpoint red spots on the skin, black, tarry stools, nosebleeds -signs of decreased red blood cells - unusually weak or tired, fainting spells, lightheadedness -breathing problems -chest pain -high or low blood pressure -mouth sores -nausea and vomiting -pain, swelling, redness or irritation at the injection site -pain, tingling, numbness in the hands or feet -slow or irregular heartbeat -swelling of the ankle, feet, hands Side effects that usually do not require medical attention (report to your doctor or health care professional if they continue or are bothersome): -bone pain -complete hair loss including hair on your head, underarms, pubic hair, eyebrows, and eyelashes -changes in the color of fingernails -diarrhea -loosening of the fingernails -loss of appetite -muscle or joint pain -red flush to skin -sweating This list may not describe all possible side effects. Call your doctor for medical advice about side effects. You may report side effects to FDA at 1-800-FDA-1088. Where should I keep my medicine? This drug is given in a hospital  or clinic and will not be stored at home. NOTE: This sheet is a summary. It may not cover all possible information. If you have questions about this medicine, talk to your doctor, pharmacist, or  health care provider.    2016, Elsevier/Gold Standard. (2014-11-07 13:02:56)  Carboplatin injection What is this medicine? CARBOPLATIN (KAR boe pla tin) is a chemotherapy drug. It targets fast dividing cells, like cancer cells, and causes these cells to die. This medicine is used to treat ovarian cancer and many other cancers. This medicine may be used for other purposes; ask your health care provider or pharmacist if you have questions. What should I tell my health care provider before I take this medicine? They need to know if you have any of these conditions: -blood disorders -hearing problems -kidney disease -recent or ongoing radiation therapy -an unusual or allergic reaction to carboplatin, cisplatin, other chemotherapy, other medicines, foods, dyes, or preservatives -pregnant or trying to get pregnant -breast-feeding How should I use this medicine? This drug is usually given as an infusion into a vein. It is administered in a hospital or clinic by a specially trained health care professional. Talk to your pediatrician regarding the use of this medicine in children. Special care may be needed. Overdosage: If you think you have taken too much of this medicine contact a poison control center or emergency room at once. NOTE: This medicine is only for you. Do not share this medicine with others. What if I miss a dose? It is important not to miss a dose. Call your doctor or health care professional if you are unable to keep an appointment. What may interact with this medicine? -medicines for seizures -medicines to increase blood counts like filgrastim, pegfilgrastim, sargramostim -some antibiotics like amikacin, gentamicin, neomycin, streptomycin, tobramycin -vaccines Talk to your doctor or health care professional before taking any of these medicines: -acetaminophen -aspirin -ibuprofen -ketoprofen -naproxen This list may not describe all possible interactions. Give your health  care provider a list of all the medicines, herbs, non-prescription drugs, or dietary supplements you use. Also tell them if you smoke, drink alcohol, or use illegal drugs. Some items may interact with your medicine. What should I watch for while using this medicine? Your condition will be monitored carefully while you are receiving this medicine. You will need important blood work done while you are taking this medicine. This drug may make you feel generally unwell. This is not uncommon, as chemotherapy can affect healthy cells as well as cancer cells. Report any side effects. Continue your course of treatment even though you feel ill unless your doctor tells you to stop. In some cases, you may be given additional medicines to help with side effects. Follow all directions for their use. Call your doctor or health care professional for advice if you get a fever, chills or sore throat, or other symptoms of a cold or flu. Do not treat yourself. This drug decreases your body's ability to fight infections. Try to avoid being around people who are sick. This medicine may increase your risk to bruise or bleed. Call your doctor or health care professional if you notice any unusual bleeding. Be careful brushing and flossing your teeth or using a toothpick because you may get an infection or bleed more easily. If you have any dental work done, tell your dentist you are receiving this medicine. Avoid taking products that contain aspirin, acetaminophen, ibuprofen, naproxen, or ketoprofen unless instructed by your doctor. These medicines may hide a fever.  Do not become pregnant while taking this medicine. Women should inform their doctor if they wish to become pregnant or think they might be pregnant. There is a potential for serious side effects to an unborn child. Talk to your health care professional or pharmacist for more information. Do not breast-feed an infant while taking this medicine. What side effects may I  notice from receiving this medicine? Side effects that you should report to your doctor or health care professional as soon as possible: -allergic reactions like skin rash, itching or hives, swelling of the face, lips, or tongue -signs of infection - fever or chills, cough, sore throat, pain or difficulty passing urine -signs of decreased platelets or bleeding - bruising, pinpoint red spots on the skin, black, tarry stools, nosebleeds -signs of decreased red blood cells - unusually weak or tired, fainting spells, lightheadedness -breathing problems -changes in hearing -changes in vision -chest pain -high blood pressure -low blood counts - This drug may decrease the number of white blood cells, red blood cells and platelets. You may be at increased risk for infections and bleeding. -nausea and vomiting -pain, swelling, redness or irritation at the injection site -pain, tingling, numbness in the hands or feet -problems with balance, talking, walking -trouble passing urine or change in the amount of urine Side effects that usually do not require medical attention (report to your doctor or health care professional if they continue or are bothersome): -hair loss -loss of appetite -metallic taste in the mouth or changes in taste This list may not describe all possible side effects. Call your doctor for medical advice about side effects. You may report side effects to FDA at 1-800-FDA-1088. Where should I keep my medicine? This drug is given in a hospital or clinic and will not be stored at home. NOTE: This sheet is a summary. It may not cover all possible information. If you have questions about this medicine, talk to your doctor, pharmacist, or health care provider.    2016, Elsevier/Gold Standard. (2007-06-27 14:38:05)

## 2016-01-09 NOTE — Addendum Note (Signed)
Encounter addended by: Malena Edman, RN on: 01/09/2016  6:11 PM<BR>    Actions taken: Patient Education assessment filed

## 2016-01-09 NOTE — Progress Notes (Signed)
Department of Radiation Oncology  Phone:  [phone] Fax:        [phone]  Weekly Treatment Note    Name: Marilyn [name] Date: 01/09/2016 MRN: [id number] DOB: November 17, 1965   Diagnosis:     ICD-9-CM ICD-10-CM   1. 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 HYDROcodone-acetaminophen (HYCET) 7.5-325 mg/15 ml solution Take 15 mLs by mouth 4 (four) times daily as needed for moderate pain. 473 mL 0  . Insulin Degludec (TRESIBA FLEXTOUCH) 200 UNIT/ML SOPN Inject 100 Units into the skin daily before breakfast.     . metoprolol tartrate (LOPRESSOR) 25 MG tablet Take 1 tablet (25 mg total) by mouth 2 (two) times daily. 60 tablet 0  . chlorhexidine (PERIDEX) 0.12 % solution Use as directed 15 mLs in the mouth or throat 2 (two) times daily. (Patient not taking: Reported on 01/09/2016) 120 mL 0  . dexamethasone (DECADRON) 4 MG tablet Take 2 tablets (8 mg total) by mouth daily. Start the day after chemotherapy for 2 days. (Patient not taking: Reported on 01/09/2016) 30 tablet 1  . lidocaine-prilocaine (EMLA) cream Apply to affected area once (Patient not taking: Reported on 01/09/2016) 30 g 3  . ondansetron (ZOFRAN) 8 MG tablet Take 1 tablet (8 mg total) by mouth 2 (two) times daily as needed for refractory nausea / vomiting. Start on day 3 after chemo. (Patient not taking: Reported on 01/09/2016) 30 tablet 1  . prochlorperazine (COMPAZINE) 10 MG tablet Take 1 tablet (10 mg total) by mouth every 6 (six) hours as needed (Nausea or vomiting). (Patient not taking: Reported on 01/09/2016) 30 tablet 1   No current facility-administered medications for this encounter.    Facility-Administered Medications Ordered in Other Encounters  Medication Dose Route Frequency Provider Last Rate Last Dose  . sodium chloride flush (NS) 0.9 % injection 10 mL  10 mL  Intracatheter PRN [name], MD   10 mL at 01/09/16 1702  . SONAFINE emulsion 1 application  1 application Topical BID Tyler Pita, MD   1 application at 90/24/09 1312     ALLERGIES: Review of patient's allergies indicates no known allergies.   LABORATORY DATA:  Lab Results  Component Value Date   WBC 11.8 (H) 01/08/2016   HGB 10.3 (L) 01/08/2016   HCT 31.6 (L) 01/08/2016   MCV 81.0 01/08/2016   PLT 423 (H) 01/08/2016   Lab Results  Component Value Date   NA 136 01/08/2016   K 3.8 01/08/2016   CL 103 01/08/2016   CO2 25 01/08/2016   Lab Results  Component Value Date   ALT 12 (L) 12/28/2015   AST 14 (L) 12/28/2015   ALKPHOS 77 12/28/2015   BILITOT 0.2 (L) 12/28/2015     NARRATIVE: Marilyn [name] was seen today for weekly treatment management. The chart was checked and the patient's films were reviewed.  Marilyn [name] has received 5 fractions to her right lung field.  Skin to right lung field with normal color.  Denies any problems with swallowing.  Coughing non productive and wheezing with SOB.  Appetite has been decreased. Pain to right area Picc site 4/10 taking Hydrocodone. Picc inserted on yesterday, no signs of infection. Patient teaching done see education for documentation.  Sonafine given with instructions. Wt Readings from Last 3 Encounters:  01/09/16 283 lb 6.4 oz (128.5  kg)  01/08/16 283 lb (128.4 kg)  01/05/16 289 lb 9.6 oz (131.4 kg)  BP 112/77 (BP Location: Left Arm, Patient Position: Sitting, Cuff Size: Large)   Pulse 65   Temp 99.3 F (37.4 C) (Oral)   Resp 20   Ht '5\' 9"'$  (1.753 m)   Wt 283 lb 6.4 oz (128.5 kg)   LMP 12/19/2015   SpO2 100%   BMI 41.85 kg/m   PHYSICAL EXAMINATION: height is '5\' 9"'$  (1.753 m) and weight is 283 lb 6.4 oz (128.5 kg). Her oral temperature is 99.3 F (37.4 C). Her blood pressure is 112/77 and her pulse is 65. Her respiration is 20 and oxygen saturation is 100%.        ASSESSMENT: The patient is doing  satisfactorily with treatment.  PLAN: We will continue with the patient's radiation treatment as planned.

## 2016-01-09 NOTE — Progress Notes (Signed)
Ok to treat today with HR 141 and BMP only done per Delle Reining, per MD [facility]

## 2016-01-12 ENCOUNTER — Ambulatory Visit (HOSPITAL_COMMUNITY)
Admission: RE | Admit: 2016-01-12 | Discharge: 2016-01-12 | Disposition: A | Discharge status: Home / Self Care | Payer: BC Managed Care – PPO | Source: Ambulatory Visit | Attending: Radiation Oncology | Admitting: Radiation Oncology

## 2016-01-12 ENCOUNTER — Ambulatory Visit: Payer: BC Managed Care – PPO

## 2016-01-12 ENCOUNTER — Ambulatory Visit
Admission: RE | Admit: 2016-01-12 | Discharge: 2016-01-12 | Disposition: A | Discharge status: Home / Self Care | Payer: BC Managed Care – PPO | Source: Ambulatory Visit | Attending: Radiation Oncology | Admitting: Radiation Oncology

## 2016-01-12 ENCOUNTER — Encounter (HOSPITAL_COMMUNITY): Payer: Self-pay

## 2016-01-12 DIAGNOSIS — I700 Atherosclerosis of aorta: Secondary | ICD-10-CM | POA: Insufficient documentation

## 2016-01-12 DIAGNOSIS — Z510 Encounter for antineoplastic radiation therapy: Secondary | ICD-10-CM | POA: Diagnosis not present

## 2016-01-12 DIAGNOSIS — C770 Secondary and unspecified malignant neoplasm of lymph nodes of head, face and neck: Secondary | ICD-10-CM | POA: Diagnosis not present

## 2016-01-12 DIAGNOSIS — C3490 Malignant neoplasm of unspecified part of unspecified bronchus or lung: Secondary | ICD-10-CM | POA: Insufficient documentation

## 2016-01-12 LAB — GLUCOSE, CAPILLARY: Glucose-Capillary: 166 mg/dL — ABNORMAL HIGH (ref 65–99)

## 2016-01-12 MED ORDER — FLUDEOXYGLUCOSE F - 18 (FDG) INJECTION: 14.2000 | Freq: Once | INTRAVENOUS | Status: DC | PRN

## 2016-01-12 NOTE — Addendum Note (Signed)
Encounter addended by: Malena Edman, RN on: 01/12/2016 12:11 PM<BR>    Actions taken: Order Reconciliation Section accessed, Home Medications modified

## 2016-01-12 NOTE — Patient Instructions (Signed)
PICC Home Guide A peripherally inserted central catheter (PICC) is a long, thin, flexible tube that is inserted into a vein in the upper arm. It is a form of intravenous (IV) access. It is considered to be a "central" line because the tip of the PICC ends in a large vein in your chest. This large vein is called the superior vena cava (SVC). The PICC tip ends in the SVC because there is a lot of blood flow in the SVC. This allows medicines and IV fluids to be quickly distributed throughout the body. The PICC is inserted using a sterile technique by a specially trained nurse or physician. After the PICC is inserted, a chest X-ray exam is done to be sure it is in the correct place.  A PICC may be placed for different reasons, such as:  To give medicines and liquid nutrition that can only be given through a central line. Examples are:  Certain antibiotic treatments.  Chemotherapy.  Total parenteral nutrition (TPN).  To take frequent blood samples.  To give IV fluids and blood products.  If there is difficulty placing a peripheral intravenous (PIV) catheter. If taken care of properly, a PICC can remain in place for several months. A PICC can also allow a person to go home from the hospital early. Medicine and PICC care can be managed at home by a family member or home health care team. WHAT PROBLEMS CAN HAPPEN WHEN I HAVE A PICC? Problems with a PICC can occasionally occur. These may include the following:  A blood clot (thrombus) forming in or at the tip of the PICC. This can cause the PICC to become clogged. A clot-dissolving medicine called tissue plasminogen activator (tPA) can be given through the PICC to help break up the clot.  Inflammation of the vein (phlebitis) in which the PICC is placed. Signs of inflammation may include redness, pain at the insertion site, red streaks, or being able to feel a "cord" in the vein where the PICC is located.  Infection in the PICC or at the insertion  site. Signs of infection may include fever, chills, redness, swelling, or pus drainage from the PICC insertion site.  PICC movement (malposition). The PICC tip may move from its original position due to excessive physical activity, forceful coughing, sneezing, or vomiting.  A break or cut in the PICC. It is important to not use scissors near the PICC.  Nerve or tendon irritation or injury during PICC insertion. WHAT SHOULD I KEEP IN MIND ABOUT ACTIVITIES WHEN I HAVE A PICC?  You may bend your arm and move it freely. If your PICC is near or at the bend of your elbow, avoid activity with repeated motion at the elbow.  Rest at home for the remainder of the day following PICC line insertion.  Avoid lifting heavy objects as instructed by your health care provider.  Avoid using a crutch with the arm on the same side as your PICC. You may need to use a walker. WHAT SHOULD I KNOW ABOUT MY PICC DRESSING?  Keep your PICC bandage (dressing) clean and dry to prevent infection.  Ask your health care provider when you may shower. Ask your health care provider to teach you how to wrap the PICC when you do take a shower.  Change the PICC dressing as instructed by your health care provider.  Change your PICC dressing if it becomes loose or wet. WHAT SHOULD I KNOW ABOUT PICC CARE?  Check the PICC insertion site   daily for leakage, redness, swelling, or pain.  Do not take a bath, swim, or use hot tubs when you have a PICC. Cover PICC line with clear plastic wrap and tape to keep it dry while showering.  Flush the PICC as directed by your health care provider. Let your health care provider know right away if the PICC is difficult to flush or does not flush. Do not use force to flush the PICC.  Do not use a syringe that is less than 10 mL to flush the PICC.  Never pull or tug on the PICC.  Avoid blood pressure checks on the arm with the PICC.  Keep your PICC identification card with you at all  times.  Do not take the PICC out yourself. Only a trained clinical professional should remove the PICC. SEEK IMMEDIATE MEDICAL CARE IF:  Your PICC is accidentally pulled all the way out. If this happens, cover the insertion site with a bandage or gauze dressing. Do not throw the PICC away. Your health care provider will need to inspect it.  Your PICC was tugged or pulled and has partially come out. Do not  push the PICC back in.  There is any type of drainage, redness, or swelling where the PICC enters the skin.  You cannot flush the PICC, it is difficult to flush, or the PICC leaks around the insertion site when it is flushed.  You hear a "flushing" sound when the PICC is flushed.  You have pain, discomfort, or numbness in your arm, shoulder, or jaw on the same side as the PICC.  You feel your heart "racing" or skipping beats.  You notice a hole or tear in the PICC.  You develop chills or a fever. MAKE SURE YOU:   Understand these instructions.  Will watch your condition.  Will get help right away if you are not doing well or get worse.   This information is not intended to replace advice given to you by your health care provider. Make sure you discuss any questions you have with your health care provider.   Document Released: 09/26/2002 Document Revised: 04/12/2014 Document Reviewed: 11/27/2012 Elsevier Interactive Patient Education 2016 [employer].  

## 2016-01-12 NOTE — Progress Notes (Signed)
PICC line dressing changed per patient request.

## 2016-01-13 ENCOUNTER — Encounter: Payer: Self-pay | Admitting: *Deleted

## 2016-01-13 ENCOUNTER — Ambulatory Visit (HOSPITAL_BASED_OUTPATIENT_CLINIC_OR_DEPARTMENT_OTHER): Payer: BC Managed Care – PPO

## 2016-01-13 ENCOUNTER — Telehealth: Payer: Self-pay | Admitting: *Deleted

## 2016-01-13 ENCOUNTER — Other Ambulatory Visit: Payer: Self-pay

## 2016-01-13 ENCOUNTER — Other Ambulatory Visit: Payer: Self-pay | Admitting: Nurse Practitioner

## 2016-01-13 ENCOUNTER — Ambulatory Visit
Admission: RE | Admit: 2016-01-13 | Discharge: 2016-01-13 | Disposition: A | Discharge status: Home / Self Care | Payer: BC Managed Care – PPO | Source: Ambulatory Visit | Attending: Radiation Oncology | Admitting: Radiation Oncology

## 2016-01-13 ENCOUNTER — Ambulatory Visit (HOSPITAL_BASED_OUTPATIENT_CLINIC_OR_DEPARTMENT_OTHER): Payer: BC Managed Care – PPO | Admitting: Nurse Practitioner

## 2016-01-13 VITALS — BP 100/59 | HR 127 | Temp 98.5°F | Resp 19 | Ht 69.0 in

## 2016-01-13 DIAGNOSIS — E860 Dehydration: Secondary | ICD-10-CM | POA: Diagnosis not present

## 2016-01-13 DIAGNOSIS — C3491 Malignant neoplasm of unspecified part of right bronchus or lung: Secondary | ICD-10-CM

## 2016-01-13 DIAGNOSIS — Z510 Encounter for antineoplastic radiation therapy: Secondary | ICD-10-CM | POA: Diagnosis not present

## 2016-01-13 DIAGNOSIS — R000 Tachycardia, unspecified: Secondary | ICD-10-CM | POA: Diagnosis not present

## 2016-01-13 DIAGNOSIS — C3490 Malignant neoplasm of unspecified part of unspecified bronchus or lung: Secondary | ICD-10-CM | POA: Diagnosis not present

## 2016-01-13 LAB — CBC WITH DIFFERENTIAL/PLATELET
BASO%: 0.1 % (ref 0.0–2.0)
Basophils Absolute: 0 10*3/uL (ref 0.0–0.1)
EOS%: 1.2 % (ref 0.0–7.0)
Eosinophils Absolute: 0.1 10*3/uL (ref 0.0–0.5)
HCT: 32.3 % — ABNORMAL LOW (ref 34.8–46.6)
HGB: 10.4 g/dL — ABNORMAL LOW (ref 11.6–15.9)
LYMPH%: 20.3 % (ref 14.0–49.7)
MCH: 25.8 pg (ref 25.1–34.0)
MCHC: 32.1 g/dL (ref 31.5–36.0)
MCV: 80.3 fL (ref 79.5–101.0)
MONO#: 0.2 10*3/uL (ref 0.1–0.9)
MONO%: 3.4 % (ref 0.0–14.0)
NEUT#: 4.3 10*3/uL (ref 1.5–6.5)
NEUT%: 75 % (ref 38.4–76.8)
PLATELETS: 382 10*3/uL (ref 145–400)
RBC: 4.02 10*6/uL (ref 3.70–5.45)
RDW: 13.3 % (ref 11.2–14.5)
WBC: 5.7 10*3/uL (ref 3.9–10.3)
lymph#: 1.2 10*3/uL (ref 0.9–3.3)

## 2016-01-13 LAB — COMPREHENSIVE METABOLIC PANEL
ALT: 10 U/L (ref 0–55)
ANION GAP: 8 meq/L (ref 3–11)
AST: 10 U/L (ref 5–34)
Albumin: 2.5 g/dL — ABNORMAL LOW (ref 3.5–5.0)
Alkaline Phosphatase: 99 U/L (ref 40–150)
BUN: 11.2 mg/dL (ref 7.0–26.0)
CHLORIDE: 106 meq/L (ref 98–109)
CO2: 28 meq/L (ref 22–29)
CREATININE: 0.6 mg/dL (ref 0.6–1.1)
Calcium: 8.5 mg/dL (ref 8.4–10.4)
EGFR: >90 mL/min/{1.73_m2} (ref >90)
Glucose: 99 mg/dl (ref 70–140)
POTASSIUM: 4 meq/L (ref 3.5–5.1)
Sodium: 141 mEq/L (ref 136–145)
Total Bilirubin: <0.3 mg/dL (ref 0.20–1.20)
Total Protein: 6.9 g/dL (ref 6.4–8.3)

## 2016-01-13 LAB — GLUCOSE, CAPILLARY: GLUCOSE-CAPILLARY: 118 mg/dL — AB (ref 65–99)

## 2016-01-13 MED ORDER — HEPARIN SOD (PORK) LOCK FLUSH 100 UNIT/ML IV SOLN
500.0000 [IU] | Freq: Once | INTRAVENOUS | Status: AC
  Administered 2016-01-13: 250 [IU] via INTRAVENOUS
  Filled 2016-01-13: qty 5

## 2016-01-13 MED ORDER — SODIUM CHLORIDE 0.9 % IV SOLN
Freq: Once | INTRAVENOUS | Status: AC
  Administered 2016-01-13: 14:00:00 via INTRAVENOUS

## 2016-01-13 MED ORDER — METOPROLOL SUCCINATE ER 25 MG PO TB24: 25.0000 mg | ORAL_TABLET | Freq: Every day | ORAL | 0 refills | Status: DC

## 2016-01-13 MED ORDER — SODIUM CHLORIDE 0.9% FLUSH
10.0000 mL | Freq: Once | INTRAVENOUS | Status: AC
  Administered 2016-01-13: 10 mL via INTRAVENOUS
  Filled 2016-01-13: qty 10

## 2016-01-13 NOTE — Progress Notes (Signed)
Oncology Nurse Navigator Documentation  Oncology Nurse Navigator Flowsheets 01/13/2016  Navigator Location [facility]-Med Onc  Navigator Encounter Type Clinic/[facility]/obtained FMLA and disability papers.  Gave to [name] to complete  Patient Visit Type MedOnc  Treatment Phase Treatment  Barriers/Navigation Needs Coordination of Care  Interventions Coordination of Care  Coordination of Care Other  Acuity Level 2  Acuity Level 2 Other  Time Spent with Patient 30

## 2016-01-13 NOTE — Progress Notes (Signed)
[facility] Clinical Social Worker  Clinical Social Worker met with patient at [facility] to review support resources and financial assistance.  Patient had disability information from her employer.  Paper work was given to nurse navigator.  CSW and patient discussed Social Security Disability and [facility].  Patient was agreeable to CSW submitting referral to [facility].  CSW completed referral.  [facility] will contact patient with additional information.  CSW and patient also reviewed and completed the Patient Access to Care Gas Program with the [facility].  Patient also plans to contact Cancer Care for financial assistance.  CSw provided contact information and encouraged patient to call with any additional needs or concerns.     , MSW, LCSW, OSW-C Clinical Social Worker [facility] [phone]             

## 2016-01-13 NOTE — Progress Notes (Signed)
1200 Received patient in the clinic following 7 of 22 intended fractions. Patient reports weakness, fatigue, headache and generally unwell. Denies nausea, vomiting, dizziness, diplopia or ringing in the ears. Heart rate is regular rhythm. Received chemotherapy (taxol/carbo) on Friday, October 6th under the care of Dr. Irene Limbo. Blood sugar 118. Patient reports her sugar typically runs 130-200. Reports right chest, neck and shoulder pain 8 on a scale of 0-10. Denies taking pain medication because she "felt like her blood pressure and sugar was low." Informed Shona Simpson, PA-C of these findings. Phoned Selena Lesser with report. Wheeled patient accompanied by her mother to registration then, lab. Patient understands Cyndee Berniece Salines will see her following lab.

## 2016-01-13 NOTE — Progress Notes (Signed)
Oncology Nurse Navigator Documentation  Oncology Nurse Navigator Flowsheets 01/13/2016  Navigator Location [facility]-Med Onc  Navigator Encounter Type Clinic/MDC/I spoke with Ms. Finnicum today before her radiation [place].  I asked how she was doing.  I listened as she explained.  I gave and explained her months schedule.  She had several questions and I clarified.  I asked about her transportation and her mother is able to bring her to appt's.  She also has already received a gas card.  She states she has FMLA papers that need to be filled out. I asked that she bring them in.  She states CSW is working on disability.  I will follow up with them.    Patient Visit Type RadOnc  Treatment Phase Treatment  Barriers/Navigation Needs Education  Education Other;Pain/ Symptom Management;Newly Diagnosed Cancer Education  Interventions Education Method  Education Method Verbal;Written  Acuity Level 2  Acuity Level 2 Educational needs  Time Spent with Patient 18

## 2016-01-13 NOTE — Patient Instructions (Signed)

## 2016-01-14 ENCOUNTER — Encounter: Payer: Self-pay | Admitting: Nurse Practitioner

## 2016-01-14 ENCOUNTER — Ambulatory Visit (HOSPITAL_BASED_OUTPATIENT_CLINIC_OR_DEPARTMENT_OTHER): Payer: BC Managed Care – PPO

## 2016-01-14 ENCOUNTER — Ambulatory Visit
Admission: RE | Admit: 2016-01-14 | Discharge: 2016-01-14 | Disposition: A | Discharge status: Home / Self Care | Payer: BC Managed Care – PPO | Source: Ambulatory Visit | Attending: Radiation Oncology | Admitting: Radiation Oncology

## 2016-01-14 VITALS — BP 119/86 | HR 109 | Temp 98.1°F | Resp 22

## 2016-01-14 DIAGNOSIS — R000 Tachycardia, unspecified: Secondary | ICD-10-CM | POA: Insufficient documentation

## 2016-01-14 DIAGNOSIS — Z452 Encounter for adjustment and management of vascular access device: Secondary | ICD-10-CM

## 2016-01-14 DIAGNOSIS — C3491 Malignant neoplasm of unspecified part of right bronchus or lung: Secondary | ICD-10-CM | POA: Diagnosis not present

## 2016-01-14 DIAGNOSIS — Z510 Encounter for antineoplastic radiation therapy: Secondary | ICD-10-CM | POA: Diagnosis not present

## 2016-01-14 DIAGNOSIS — E860 Dehydration: Secondary | ICD-10-CM | POA: Insufficient documentation

## 2016-01-14 MED ORDER — SODIUM CHLORIDE 0.9% FLUSH
10.0000 mL | INTRAVENOUS | Status: DC | PRN
  Administered 2016-01-14: 10 mL via INTRAVENOUS
  Filled 2016-01-14: qty 10

## 2016-01-14 MED ORDER — HEPARIN SOD (PORK) LOCK FLUSH 100 UNIT/ML IV SOLN
500.0000 [IU] | Freq: Once | INTRAVENOUS | Status: AC
  Administered 2016-01-14: 500 [IU] via INTRAVENOUS
  Filled 2016-01-14: qty 5

## 2016-01-14 NOTE — Assessment & Plan Note (Signed)
Patient presented from radiation oncology today with complaint of dehydration and continued, chronic tachycardia.  Also, patient's blood pressure was down to 100/59 on initial check today as well.  Patient has been experiencing chronic tachycardia in the range of 127-135.  Since prior to her cancer diagnosis.  Patient reports that her chronic tachycardia is secondary to the lung cancer mass proximity to her heart.  She has been taking Lopressor twice daily as previously directed per her hospitalist.  She feels increasingly fatigued and occasionally dizzy with any position changes as well.  She denies any other new symptoms.  She denies any recent fevers or chills.  EKG obtained today revealed: Marilyn [name], Marilyn [name] [id number] 13-Jan-2016 15:16:32 [facility] ROUTINE RECORD Sinus tachycardia Nonspecific T wave abnormality Abnormal ECG no significant change from 12/26/2015 Confirmed by Einar Gip MD, JAGADEESH ([id number]) on 01/13/2016 6:21:02 PM 42m/s 153mmV '100Hz'$  9.0.4 12SL 237 CID: 118 Confirmed By: JAKela MillinD Vent. rate 127 BPM PR interval 160 ms QRS duration 84 ms QT/QTc 288/418 ms P-R-T axes 55 34 44 [id number] yr) Female Black Room: Loc:511 Technician  Reviewed all findings with Dr. KaIrene Limboand he suggested that patient discontinue the Lopressor; and instead start taking Toprol-XL 25 mg on a daily basis for rate control.  May need to consider adjusting the Toprol depending on patient's tolerance of the medication and the rate control.  It provides.  Also, patient was advised to keep a blood pressure log.  She was advised to call/return or go directly to the emergency department for any worsening symptoms whatsoever.

## 2016-01-14 NOTE — Assessment & Plan Note (Signed)
Patient reports minimal appetite and poor oral intake recently.  She feels very fatigued and dehydrated.  She will receive IV fluid rehydration while at the cancer Center today.  She is encouraged to push fluids is much as possible.

## 2016-01-14 NOTE — Assessment & Plan Note (Addendum)
Patient received cycle one of her Taxol/carboplatin chemotherapy regimen on 01/09/2016.  Patient also continues to undergo daily radiation treatments.  She is scheduled to return for labs, flush, visit, and her next cycle of chemotherapy on 01/16/2016.

## 2016-01-14 NOTE — Progress Notes (Signed)
SYMPTOM MANAGEMENT CLINIC    Chief Complaint: Nausea, dehydration, tachycardia  HPI:  Marilyn [name] 50 y.o. female diagnosed with lung cancer.  Currently undergoing Taxol/carboplatin chemotherapy regimen and radiation treatments.    No history exists.    Review of Systems  Constitutional: Positive for malaise/fatigue and weight loss.  Cardiovascular: Negative for palpitations.  Gastrointestinal: Positive for nausea.  All other systems reviewed and are negative.   Past Medical History:  Diagnosis Date  . Asthma   . Diabetes mellitus   . Hypercholesteremia   . Hypertension   . Lung cancer ([place])   . Neuropathy ([place])   . Obese     Past Surgical History:  Procedure Laterality Date  . CESAREAN SECTION    . IR GENERIC HISTORICAL  01/08/2016   IR US GUIDE VASC ACCESS RIGHT 01/08/2016 [name], DO [facility]-INTERV RAD  . IR GENERIC HISTORICAL  01/08/2016   IR FLUORO GUIDE CV LINE RIGHT 01/08/2016 [name], DO [facility]INTER[facility]  . TUBAL LIGATION    . US guided core needle biopsy     of right lower neck/supraclavicular lymph nodes    has Chest pain; Headache; Diabetes mellitus ([place]); Mediastinal mass; Non-small cell cancer of right lung ([place]); Adenocarcinoma of left lung, stage 3 ([place]); Dehydration; and Tachycardia on her problem list.    has No Known Allergies.    Medication List       Accurate as of 01/13/16 11:59 PM. Always use your most recent med list.          chlorhexidine 0.12 % solution Commonly known as:  PERIDEX Use as directed 15 mLs in the mouth or throat 2 (two) times daily.   dexamethasone 4 MG tablet Commonly known as:  DECADRON Take 2 tablets (8 mg total) by mouth daily. Start the day after chemotherapy for 2 days.   Fluocinonide 0.1 % Crea   HYDROcodone-acetaminophen 7.5-325 mg/15 ml solution Commonly known as:  HYCET Take 15 mLs by mouth 4 (four) times daily as needed for moderate pain.   lidocaine-prilocaine cream Commonly known as:   EMLA Apply to affected area once   metoprolol succinate 25 MG 24 hr tablet Commonly known as:  TOPROL-XL Take 1 tablet (25 mg total) by mouth daily.   ondansetron 8 MG tablet Commonly known as:  ZOFRAN Take 1 tablet (8 mg total) by mouth 2 (two) times daily as needed for refractory nausea / vomiting. Start on day 3 after chemo.   prochlorperazine 10 MG tablet Commonly known as:  COMPAZINE Take 1 tablet (10 mg total) by mouth every 6 (six) hours as needed (Nausea or vomiting).   SONAFINE Apply 1 application topically 2 (two) times daily.   TRESIBA FLEXTOUCH 200 UNIT/ML Sopn Generic drug:  Insulin Degludec Inject 100 Units into the skin daily before breakfast.        PHYSICAL EXAMINATION  Oncology Vitals 01/14/2016 01/13/2016  Height - 175 cm  Weight - (No Data)  Weight (lbs) - (No Data)  BMI (kg/m2) - -  Temp 98.1 98.5  Pulse 109 127  Resp 22 19  SpO2 100 100  BSA (m2) - -   BP Readings from Last 2 Encounters:  01/14/16 119/86  01/13/16 101/74    Physical Exam  Constitutional: She is oriented to person, place, and time and well-developed, well-nourished, and in no distress. She appears dehydrated. She appears unhealthy.  HENT:  Head: Normocephalic and atraumatic.  Mouth/Throat: Oropharynx is clear and moist.  Eyes: Conjunctivae and EOM  are normal. Pupils are equal, round, and reactive to light. Right eye exhibits no discharge. Left eye exhibits no discharge. No scleral icterus.  Neck: Normal range of motion. Neck supple. No JVD present. No tracheal deviation present. No thyromegaly present.  Cardiovascular: Normal heart sounds and intact distal pulses.   Tachycardia  Pulmonary/Chest: Effort normal and breath sounds normal. No respiratory distress. She has no wheezes. She has no rales. She exhibits no tenderness.  Abdominal: Soft. Bowel sounds are normal. She exhibits no distension and no mass. There is no tenderness. There is no rebound and no guarding.   Musculoskeletal: Normal range of motion. She exhibits no edema or tenderness.  Lymphadenopathy:    She has no cervical adenopathy.  Neurological: She is alert and oriented to person, place, and time. Gait normal.  Skin: Skin is warm and dry. No rash noted. No erythema. No pallor.  Psychiatric: Affect normal.  Nursing note and vitals reviewed.   LABORATORY DATA:. Hospital Outpatient Visit on 01/13/2016  Component Date Value Ref Range Status  . Glucose-Capillary 01/13/2016 118* 65 - 99 mg/dL Final  Appointment on 01/13/2016  Component Date Value Ref Range Status  . WBC 01/13/2016 5.7  3.9 - 10.3 10e3/uL Final  . NEUT# 01/13/2016 4.3  1.5 - 6.5 10e3/uL Final  . HGB 01/13/2016 10.4* 11.6 - 15.9 g/dL Final  . HCT 01/13/2016 32.3* 34.8 - 46.6 % Final  . Platelets 01/13/2016 382  145 - 400 10e3/uL Final  . MCV 01/13/2016 80.3  79.5 - 101.0 fL Final  . MCH 01/13/2016 25.8  25.1 - 34.0 pg Final  . MCHC 01/13/2016 32.1  31.5 - 36.0 g/dL Final  . RBC 01/13/2016 4.02  3.70 - 5.45 10e6/uL Final  . RDW 01/13/2016 13.3  11.2 - 14.5 % Final  . lymph# 01/13/2016 1.2  0.9 - 3.3 10e3/uL Final  . MONO# 01/13/2016 0.2  0.1 - 0.9 10e3/uL Final  . Eosinophils Absolute 01/13/2016 0.1  0.0 - 0.5 10e3/uL Final  . Basophils Absolute 01/13/2016 0.0  0.0 - 0.1 10e3/uL Final  . NEUT% 01/13/2016 75.0  38.4 - 76.8 % Final  . LYMPH% 01/13/2016 20.3  14.0 - 49.7 % Final  . MONO% 01/13/2016 3.4  0.0 - 14.0 % Final  . EOS% 01/13/2016 1.2  0.0 - 7.0 % Final  . BASO% 01/13/2016 0.1  0.0 - 2.0 % Final  . Sodium 01/13/2016 141  136 - 145 mEq/L Final  . Potassium 01/13/2016 4.0  3.5 - 5.1 mEq/L Final  . Chloride 01/13/2016 106  98 - 109 mEq/L Final  . CO2 01/13/2016 28  22 - 29 mEq/L Final  . Glucose 01/13/2016 99  70 - 140 mg/dl Final  . BUN 01/13/2016 11.2  7.0 - 26.0 mg/dL Final  . Creatinine 01/13/2016 0.6  0.6 - 1.1 mg/dL Final  . Total Bilirubin 01/13/2016 <0.30  0.20 - 1.20 mg/dL Final  . Alkaline  Phosphatase 01/13/2016 99  40 - 150 U/L Final  . AST 01/13/2016 10  5 - 34 U/L Final  . ALT 01/13/2016 10  0 - 55 U/L Final  . Total Protein 01/13/2016 6.9  6.4 - 8.3 g/dL Final  . Albumin 01/13/2016 2.5* 3.5 - 5.0 g/dL Final  . Calcium 01/13/2016 8.5  8.4 - 10.4 mg/dL Final  . Anion Gap 01/13/2016 8  3 - 11 mEq/L Final  . EGFR 01/13/2016 >90  >90 ml/min/1.73 m2 Final  Hospital Outpatient Visit on 01/12/2016  Component Date Value Ref Range Status  .  Glucose-Capillary 01/12/2016 166* 65 - 99 mg/dL Final   EKG:  SEMAJA, LYMON [id number] 13-Jan-2016 15:16:32 [facility] ROUTINE RECORD Sinus tachycardia Nonspecific T wave abnormality Abnormal ECG no significant change from 12/26/2015 Confirmed by Einar Gip MD, JAGADEESH ([id number]) on 01/13/2016 6:21:02 PM 74m/s 162mmV '100Hz'  9.0.4 12SL 237 CID: 118 Confirmed By: JAKela MillinD Vent. rate 127 BPM PR interval 160 ms QRS duration 84 ms QT/QTc 288/418 ms P-R-T axes 55 34 44 [id number] yr) Female Black Room: Loc:511 Technician  RADIOGRAPHIC STUDIES: Nm Pet Image Initial (pi) Skull Base To Thigh  Result Date: 01/12/2016 CLINICAL DATA:  Initial treatment strategy for metastatic non-small-cell carcinoma diagnosed on right supraclavicular lymph node biopsy on 12/29/2015 (consistent with primary lung adenocarcinoma on pathology report). EXAM: NUCLEAR MEDICINE PET SKULL BASE TO THIGH TECHNIQUE: 14.2 mCi F-18 FDG was injected intravenously. Full-ring PET imaging was performed from the skull base to thigh after the radiotracer. CT data was obtained and used for attenuation correction and anatomic localization. FASTING BLOOD GLUCOSE:  Value: 166 mg/dl COMPARISON:  12/26/2015 chest CT angiogram. 12/27/2015 CT abdomen/ pelvis. FINDINGS: NECK There are several hypermetabolic lymph nodes in the right lower neck involving nodal levels 3, 4 and 5, with representative nodes as follows: - 1.5 cm right level 3 node with max SUV 12.3  (series 4/image 47) - 2.3 cm right level 4 node with max SUV 16.9 (series 4/image 54) - 1.7 cm right level 5 node with max SUV 14.1 (series 4/image 52) No hypermetabolic left neck lymph nodes. CHEST Hypermetabolic 1.7 cm peripheral right hilar nodule with max SUV 22.1 (series 4/image 83). Centrally necrotic bulky hypermetabolic 3.9 cm right paratracheal node with max SUV 16.3 (series 4/image 71). Bilateral hypermetabolic scalene adenopathy measuring 1.3 cm on the right with max SUV 12.0 (series 4/image 59) and 1.3 cm on the left with max SUV 13.3 (series 4/image 59). Bilateral upper mediastinal prevascular hypermetabolic adenopathy measuring 1.6 cm on the right with max SUV 11.9 (series 4/image 72) and 1.0 cm on the left with max SUV 13.1 (series 4/image 68). Right PICC terminates at the cavoatrial junction. Mildly atherosclerotic nonaneurysmal thoracic aorta. No pleural effusions. No acute consolidative airspace disease, lung masses or significant pulmonary nodules. ABDOMEN/PELVIS No abnormal hypermetabolic activity within the liver, pancreas, or spleen. No hypermetabolic lymph nodes in the abdomen or pelvis. There is mild thickening of the left adrenal gland up to 0.6 cm thickness (series 4/image 123), which is not considered a true left adrenal nodule at this time. There is no hypermetabolism within the adrenal glands. SKELETON No focal hypermetabolic activity to suggest skeletal metastasis. IMPRESSION: 1. Hypermetabolic 1.7 cm peripheral right hilar nodule. Given the peripheral location of this nodule within the right hilum, this could potentially represent a centrally located primary bronchogenic malignancy. 2. Hypermetabolic bulky ipsilateral paratracheal, bilateral prevascular mediastinal and bilateral scalene nodal metastases. 3. Hypermetabolic right lower neck nodal metastases involving right neck nodal levels III, IV and V. 4. No hypermetabolic metastatic disease in the abdomen, pelvis or skeleton. 5.  Aortic atherosclerosis. Electronically Signed   By: JaIlona Sorrel.D.   On: 01/12/2016 09:42    ASSESSMENT/PLAN:    Tachycardia Patient presented from radiation oncology today with complaint of dehydration and continued, chronic tachycardia.  Also, patient's blood pressure was down to 100/59 on initial check today as well.  Patient has been experiencing chronic tachycardia in the range of 127-135.  Since prior to her cancer diagnosis.  Patient reports that her chronic tachycardia is  secondary to the lung cancer mass proximity to her heart.  She has been taking Lopressor twice daily as previously directed per her hospitalist.  She feels increasingly fatigued and occasionally dizzy with any position changes as well.  She denies any other new symptoms.  She denies any recent fevers or chills.  EKG obtained today revealed: Marilyn [name], Marilyn [name] [id number] 13-Jan-2016 15:16:32 [facility] ROUTINE RECORD Sinus tachycardia Nonspecific T wave abnormality Abnormal ECG no significant change from 12/26/2015 Confirmed by Einar Gip MD, JAGADEESH ([id number]) on 01/13/2016 6:21:02 PM 43m/s 153mmV '100Hz'  9.0.4 12SL 237 CID: 118 Confirmed By: JAKela MillinD Vent. rate 127 BPM PR interval 160 ms QRS duration 84 ms QT/QTc 288/418 ms P-R-T axes 55 34 44 [id number] yr) Female Black Room: Loc:511 Technician  Reviewed all findings with Dr. KaIrene Limboand he suggested that patient discontinue the Lopressor; and instead start taking Toprol-XL 25 mg on a daily basis for rate control.  May need to consider adjusting the Toprol depending on patient's tolerance of the medication and the rate control.  It provides.  Also, patient was advised to keep a blood pressure log.  She was advised to call/return or go directly to the emergency department for any worsening symptoms whatsoever.  [facility] of her Taxol/carboplatin chemotherapy regimen on  01/09/2016.  Patient also continues to undergo daily radiation treatments.  She is scheduled to return for labs, flush, visit, and her next cycle of chemotherapy on 01/16/2016.  Dehydration Patient reports minimal appetite and poor oral intake recently.  She feels very fatigued and dehydrated.  She will receive IV fluid rehydration while at the cancer Center today.  She is encouraged to push fluids is much as possible.   Patient stated understanding of all instructions; and was in agreement with this plan of care. The patient knows to call the clinic with any problems, questions or concerns.   Total time spent with patient was 40 minutes;  with greater than 75 percent of that time spent in face to face counseling regarding patient's symptoms,  and coordination of care and follow up.  Disclaimer:This dictation was prepared with Dragon/digital dictation along with SmApple ComputerAny transcriptional errors that result from this process are unintentional.  BaDrue SecondNP 01/14/2016

## 2016-01-14 NOTE — Patient Instructions (Signed)
PICC Home Guide A peripherally inserted central catheter (PICC) is a long, thin, flexible tube that is inserted into a vein in the upper arm. It is a form of intravenous (IV) access. It is considered to be a "central" line because the tip of the PICC ends in a large vein in your chest. This large vein is called the superior vena cava (SVC). The PICC tip ends in the SVC because there is a lot of blood flow in the SVC. This allows medicines and IV fluids to be quickly distributed throughout the body. The PICC is inserted using a sterile technique by a specially trained nurse or physician. After the PICC is inserted, a chest X-ray exam is done to be sure it is in the correct place.  A PICC may be placed for different reasons, such as:  To give medicines and liquid nutrition that can only be given through a central line. Examples are:  Certain antibiotic treatments.  Chemotherapy.  Total parenteral nutrition (TPN).  To take frequent blood samples.  To give IV fluids and blood products.  If there is difficulty placing a peripheral intravenous (PIV) catheter. If taken care of properly, a PICC can remain in place for several months. A PICC can also allow a person to go home from the hospital early. Medicine and PICC care can be managed at home by a family member or home health care team. WHAT PROBLEMS CAN HAPPEN WHEN I HAVE A PICC? Problems with a PICC can occasionally occur. These may include the following:  A blood clot (thrombus) forming in or at the tip of the PICC. This can cause the PICC to become clogged. A clot-dissolving medicine called tissue plasminogen activator (tPA) can be given through the PICC to help break up the clot.  Inflammation of the vein (phlebitis) in which the PICC is placed. Signs of inflammation may include redness, pain at the insertion site, red streaks, or being able to feel a "cord" in the vein where the PICC is located.  Infection in the PICC or at the insertion  site. Signs of infection may include fever, chills, redness, swelling, or pus drainage from the PICC insertion site.  PICC movement (malposition). The PICC tip may move from its original position due to excessive physical activity, forceful coughing, sneezing, or vomiting.  A break or cut in the PICC. It is important to not use scissors near the PICC.  Nerve or tendon irritation or injury during PICC insertion. WHAT SHOULD I KEEP IN MIND ABOUT ACTIVITIES WHEN I HAVE A PICC?  You may bend your arm and move it freely. If your PICC is near or at the bend of your elbow, avoid activity with repeated motion at the elbow.  Rest at home for the remainder of the day following PICC line insertion.  Avoid lifting heavy objects as instructed by your health care provider.  Avoid using a crutch with the arm on the same side as your PICC. You may need to use a walker. WHAT SHOULD I KNOW ABOUT MY PICC DRESSING?  Keep your PICC bandage (dressing) clean and dry to prevent infection.  Ask your health care provider when you may shower. Ask your health care provider to teach you how to wrap the PICC when you do take a shower.  Change the PICC dressing as instructed by your health care provider.  Change your PICC dressing if it becomes loose or wet. WHAT SHOULD I KNOW ABOUT PICC CARE?  Check the PICC insertion site   daily for leakage, redness, swelling, or pain.  Do not take a bath, swim, or use hot tubs when you have a PICC. Cover PICC line with clear plastic wrap and tape to keep it dry while showering.  Flush the PICC as directed by your health care provider. Let your health care provider know right away if the PICC is difficult to flush or does not flush. Do not use force to flush the PICC.  Do not use a syringe that is less than 10 mL to flush the PICC.  Never pull or tug on the PICC.  Avoid blood pressure checks on the arm with the PICC.  Keep your PICC identification card with you at all  times.  Do not take the PICC out yourself. Only a trained clinical professional should remove the PICC. SEEK IMMEDIATE MEDICAL CARE IF:  Your PICC is accidentally pulled all the way out. If this happens, cover the insertion site with a bandage or gauze dressing. Do not throw the PICC away. Your health care provider will need to inspect it.  Your PICC was tugged or pulled and has partially come out. Do not  push the PICC back in.  There is any type of drainage, redness, or swelling where the PICC enters the skin.  You cannot flush the PICC, it is difficult to flush, or the PICC leaks around the insertion site when it is flushed.  You hear a "flushing" sound when the PICC is flushed.  You have pain, discomfort, or numbness in your arm, shoulder, or jaw on the same side as the PICC.  You feel your heart "racing" or skipping beats.  You notice a hole or tear in the PICC.  You develop chills or a fever. MAKE SURE YOU:   Understand these instructions.  Will watch your condition.  Will get help right away if you are not doing well or get worse.   This information is not intended to replace advice given to you by your health care provider. Make sure you discuss any questions you have with your health care provider.   Document Released: 09/26/2002 Document Revised: 04/12/2014 Document Reviewed: 11/27/2012 Elsevier Interactive Patient Education 2016 [employer].  

## 2016-01-14 NOTE — Progress Notes (Signed)
Counseling intern called patient on Friday, October 6 at approximately 3:15 pm at request of [place] worker to contact patient to offer counseling services. Intern left voice mail message with contact information offering to meet with patient on Wednesday, Thursday, or Friday of the next week during one of patient's hospital appointments.   Lamount Cohen, Counseling Intern Department for Spiritual Care and [facility] Supervisor - [address] [place], [place]

## 2016-01-15 ENCOUNTER — Ambulatory Visit
Admission: RE | Admit: 2016-01-15 | Discharge: 2016-01-15 | Disposition: A | Discharge status: Home / Self Care | Payer: BC Managed Care – PPO | Source: Ambulatory Visit | Attending: Radiation Oncology | Admitting: Radiation Oncology

## 2016-01-15 VITALS — BP 121/88 | HR 111 | Temp 98.7°F | Resp 18 | Wt 285.8 lb

## 2016-01-15 DIAGNOSIS — Z510 Encounter for antineoplastic radiation therapy: Secondary | ICD-10-CM | POA: Diagnosis not present

## 2016-01-15 DIAGNOSIS — C3491 Malignant neoplasm of unspecified part of right bronchus or lung: Secondary | ICD-10-CM

## 2016-01-15 NOTE — Progress Notes (Signed)
  Radiation Oncology         [phone]   Name: Marilyn [name] MRN: [id number]   Date: 01/15/2016  DOB: 08/29/1965   Weekly Radiation Therapy Management    ICD-9-CM ICD-10-CM   1. Non-small cell cancer of right lung (HCC) 162.9 C34.91     Current Dose: 18 Gy  Planned Dose:  44 Gy  Narrative The patient presents for routine under treatment assessment.  Marilyn [name] feels a lot better than Marilyn [name] did on Tuesday, 01/13/16. Weight and vitals stable. Persistent cough noted worse with eating. Reports right chest, shoulder and right neck are less at 3 on a scale of 0-10. Reports SOB has improved. Denies pain or difficulty associated with swallowing. Denies skin changes within the treatment field. Reports fatigue.    The patient is without complaint. Set-up films were reviewed. The chart was checked.  Physical Findings  weight is 285 lb 12.8 oz (129.6 kg). Marilyn [name] oral temperature is 98.7 F (37.1 C). Marilyn [name] blood pressure is 121/88 and Marilyn [name] pulse is 111 (abnormal). Marilyn [name] respiration is 18 and oxygen saturation is 98%. . Weight essentially stable.  No significant changes.  Impression The patient is tolerating radiation.  Plan Continue treatment as planned. I spoke with the patient about foods and drinks which may irritate Marilyn [name] throat so Marilyn [name] knows to avoid them.          Sheral Apley Tammi Klippel, M.D.  This document serves as a record of services personally performed by Tyler Pita, MD. It was created on his behalf by Maryla Morrow, a trained medical scribe. The creation of this record is based on the scribe's personal observations and the provider's statements to them. This document has been checked and approved by the attending provider.

## 2016-01-15 NOTE — Progress Notes (Signed)
Weight and vitals stable. Persistent dry cough noted worse with eating. Reports right chest, shoulder and right neck are less at 3 on a scale of 0-10. Reports SOB has improved. Denies pain or difficulty associated with swallowing. Denies skin changes within treatment field. Reports fatigue.   BP 121/88 (BP Location: Left Arm, Patient Position: Sitting, Cuff Size: Large)   Pulse (!) 111   Temp 98.7 F (37.1 C) (Oral)   Resp 18   Wt 285 lb 12.8 oz (129.6 kg)   LMP 12/19/2015   SpO2 98%   BMI 42.21 kg/m  Wt Readings from Last 3 Encounters:  01/15/16 285 lb 12.8 oz (129.6 kg)  01/09/16 283 lb 6.4 oz (128.5 kg)  01/08/16 283 lb (128.4 kg)

## 2016-01-16 ENCOUNTER — Other Ambulatory Visit (HOSPITAL_BASED_OUTPATIENT_CLINIC_OR_DEPARTMENT_OTHER): Payer: BC Managed Care – PPO

## 2016-01-16 ENCOUNTER — Encounter: Payer: Self-pay | Admitting: Hematology

## 2016-01-16 ENCOUNTER — Ambulatory Visit
Admission: RE | Admit: 2016-01-16 | Discharge: 2016-01-16 | Disposition: A | Discharge status: Home / Self Care | Payer: BC Managed Care – PPO | Source: Ambulatory Visit | Attending: Radiation Oncology | Admitting: Radiation Oncology

## 2016-01-16 ENCOUNTER — Ambulatory Visit (HOSPITAL_BASED_OUTPATIENT_CLINIC_OR_DEPARTMENT_OTHER): Payer: BC Managed Care – PPO

## 2016-01-16 ENCOUNTER — Ambulatory Visit (HOSPITAL_BASED_OUTPATIENT_CLINIC_OR_DEPARTMENT_OTHER): Payer: BC Managed Care – PPO | Admitting: Hematology

## 2016-01-16 DIAGNOSIS — E119 Type 2 diabetes mellitus without complications: Secondary | ICD-10-CM

## 2016-01-16 DIAGNOSIS — C3492 Malignant neoplasm of unspecified part of left bronchus or lung: Secondary | ICD-10-CM

## 2016-01-16 DIAGNOSIS — G893 Neoplasm related pain (acute) (chronic): Secondary | ICD-10-CM | POA: Diagnosis not present

## 2016-01-16 DIAGNOSIS — Z5111 Encounter for antineoplastic chemotherapy: Secondary | ICD-10-CM | POA: Diagnosis not present

## 2016-01-16 DIAGNOSIS — R739 Hyperglycemia, unspecified: Secondary | ICD-10-CM

## 2016-01-16 DIAGNOSIS — C3491 Malignant neoplasm of unspecified part of right bronchus or lung: Secondary | ICD-10-CM

## 2016-01-16 DIAGNOSIS — R000 Tachycardia, unspecified: Secondary | ICD-10-CM

## 2016-01-16 DIAGNOSIS — C770 Secondary and unspecified malignant neoplasm of lymph nodes of head, face and neck: Secondary | ICD-10-CM | POA: Diagnosis not present

## 2016-01-16 DIAGNOSIS — E279 Disorder of adrenal gland, unspecified: Secondary | ICD-10-CM | POA: Diagnosis not present

## 2016-01-16 DIAGNOSIS — E785 Hyperlipidemia, unspecified: Secondary | ICD-10-CM

## 2016-01-16 DIAGNOSIS — Z510 Encounter for antineoplastic radiation therapy: Secondary | ICD-10-CM | POA: Diagnosis not present

## 2016-01-16 DIAGNOSIS — I10 Essential (primary) hypertension: Secondary | ICD-10-CM

## 2016-01-16 LAB — COMPREHENSIVE METABOLIC PANEL
ALBUMIN: 2.7 g/dL — AB (ref 3.5–5.0)
ALK PHOS: 98 U/L (ref 40–150)
ALT: 8 U/L (ref 0–55)
AST: 10 U/L (ref 5–34)
Anion Gap: 9 mEq/L (ref 3–11)
BUN: 8.5 mg/dL (ref 7.0–26.0)
CALCIUM: 9 mg/dL (ref 8.4–10.4)
CHLORIDE: 103 meq/L (ref 98–109)
CO2: 27 mEq/L (ref 22–29)
Creatinine: 0.8 mg/dL (ref 0.6–1.1)
GLUCOSE: 268 mg/dL — AB (ref 70–140)
POTASSIUM: 4.4 meq/L (ref 3.5–5.1)
SODIUM: 139 meq/L (ref 136–145)
Total Bilirubin: <0.22 mg/dL (ref 0.20–1.20)
Total Protein: 7.1 g/dL (ref 6.4–8.3)

## 2016-01-16 LAB — CBC & DIFF AND RETIC
BASO%: 0 % (ref 0.0–2.0)
Basophils Absolute: 0 10*3/uL (ref 0.0–0.1)
EOS ABS: 0.2 10*3/uL (ref 0.0–0.5)
EOS%: 2.2 % (ref 0.0–7.0)
HCT: 31.4 % — ABNORMAL LOW (ref 34.8–46.6)
HEMOGLOBIN: 10 g/dL — AB (ref 11.6–15.9)
Immature Retic Fract: 2.5 % (ref 1.60–10.00)
LYMPH%: 10.4 % — AB (ref 14.0–49.7)
MCH: 26.2 pg (ref 25.1–34.0)
MCHC: 31.8 g/dL (ref 31.5–36.0)
MCV: 82.2 fL (ref 79.5–101.0)
MONO#: 0.3 10*3/uL (ref 0.1–0.9)
MONO%: 4.9 % (ref 0.0–14.0)
NEUT%: 82.5 % — ABNORMAL HIGH (ref 38.4–76.8)
NEUTROS ABS: 5.5 10*3/uL (ref 1.5–6.5)
Platelets: 292 10*3/uL (ref 145–400)
RBC: 3.82 10*6/uL (ref 3.70–5.45)
RDW: 12.7 % (ref 11.2–14.5)
Retic %: 1.11 % (ref 0.70–2.10)
Retic Ct Abs: 42.4 10*3/uL (ref 33.70–90.70)
WBC: 6.7 10*3/uL (ref 3.9–10.3)
lymph#: 0.7 10*3/uL — ABNORMAL LOW (ref 0.9–3.3)

## 2016-01-16 LAB — CEA (IN HOUSE-CHCC): CEA (CHCC-IN HOUSE): 4.59 ng/mL (ref 0.00–5.00)

## 2016-01-16 MED ORDER — FAMOTIDINE IN NACL 20-0.9 MG/50ML-% IV SOLN
20.0000 mg | Freq: Once | INTRAVENOUS | Status: AC
  Administered 2016-01-16: 20 mg via INTRAVENOUS

## 2016-01-16 MED ORDER — PALONOSETRON HCL INJECTION 0.25 MG/5ML
INTRAVENOUS | Status: AC
  Filled 2016-01-16: qty 5

## 2016-01-16 MED ORDER — FAMOTIDINE IN NACL 20-0.9 MG/50ML-% IV SOLN
INTRAVENOUS | Status: AC
  Filled 2016-01-16: qty 50

## 2016-01-16 MED ORDER — SODIUM CHLORIDE 0.9 % IV SOLN
INTRAVENOUS | Status: DC
  Administered 2016-01-16: 12:00:00 via INTRAVENOUS

## 2016-01-16 MED ORDER — SODIUM CHLORIDE 0.9 % IV SOLN
Freq: Once | INTRAVENOUS | Status: AC
  Administered 2016-01-16: 15:00:00 via INTRAVENOUS

## 2016-01-16 MED ORDER — INSULIN ASPART 100 UNIT/ML FLEXPEN: PEN_INJECTOR | SUBCUTANEOUS | 2 refills | Status: AC

## 2016-01-16 MED ORDER — ASPIRIN EC 81 MG PO TBEC: 81.0000 mg | DELAYED_RELEASE_TABLET | Freq: Every day | ORAL | 2 refills | Status: DC

## 2016-01-16 MED ORDER — SODIUM CHLORIDE 0.9% FLUSH
10.0000 mL | INTRAVENOUS | Status: DC | PRN
  Administered 2016-01-16: 10 mL
  Filled 2016-01-16: qty 10

## 2016-01-16 MED ORDER — SODIUM CHLORIDE 0.9 % IV SOLN
300.0000 mg | Freq: Once | INTRAVENOUS | Status: AC
  Administered 2016-01-16: 300 mg via INTRAVENOUS
  Filled 2016-01-16: qty 30

## 2016-01-16 MED ORDER — SODIUM CHLORIDE 0.9 % IV SOLN
45.0000 mg/m2 | Freq: Once | INTRAVENOUS | Status: AC
  Administered 2016-01-16: 114 mg via INTRAVENOUS
  Filled 2016-01-16: qty 19

## 2016-01-16 MED ORDER — SODIUM CHLORIDE 0.9 % IV SOLN
20.0000 mg | Freq: Once | INTRAVENOUS | Status: AC
  Administered 2016-01-16: 20 mg via INTRAVENOUS
  Filled 2016-01-16: qty 2

## 2016-01-16 MED ORDER — DIPHENHYDRAMINE HCL 50 MG/ML IJ SOLN
INTRAMUSCULAR | Status: AC
  Filled 2016-01-16: qty 1

## 2016-01-16 MED ORDER — DIPHENHYDRAMINE HCL 50 MG/ML IJ SOLN
50.0000 mg | Freq: Once | INTRAMUSCULAR | Status: AC
  Administered 2016-01-16: 50 mg via INTRAVENOUS

## 2016-01-16 MED ORDER — PALONOSETRON HCL INJECTION 0.25 MG/5ML
0.2500 mg | Freq: Once | INTRAVENOUS | Status: AC
  Administered 2016-01-16: 0.25 mg via INTRAVENOUS

## 2016-01-16 MED ORDER — HEPARIN SOD (PORK) LOCK FLUSH 100 UNIT/ML IV SOLN
250.0000 [IU] | Freq: Once | INTRAVENOUS | Status: AC | PRN
Start: 2016-01-16 — End: 2016-01-16
  Administered 2016-01-16: 250 [IU]
  Filled 2016-01-16: qty 5

## 2016-01-16 NOTE — Progress Notes (Signed)
Counseling intern met with patient and her mother during patient's infusion treatment as follow-up to counselor's call to patient on 01/09/2016. Counselor further explained role as offering emotional support to patient and family. Counselor also explained requirements for recording sessions as part of educational supervision, and patient and mother both signed the consent to audio-record. Consent forms will be kept on file in the Department for [place].  Patient appeared to be in good spirits. She smiled frequently and engaged warmly with counselor throughout the visit. She indicated that although she is going through a hard time, she has accepted her condition as her "testimony" and that she has placed her faith in God for a positive outcome. Patient expressed appreciation for her medical team and for being in a supportive environment at the cancer center. When asked, patient and her mother described their "normal" home and family life activities when they are are not at the hospital. Patient's mother also described the many ways she cares for patient and other members of the family. Both also described appreciation for the support they receive from their church/spiritual community. 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  Patient stated that at times her memory is not as good, and she relies on her mother to keep up with information from her medical team.   Counselor offered to meet with patient during her next infusion treatment, and patient welcomed the visit.  Lamount Cohen, Counseling Intern Department for Spiritual Care and [facility] Supervisor - [address] [place], [place]

## 2016-01-16 NOTE — Patient Instructions (Signed)
[facility] Discharge Instructions for Patients Receiving Chemotherapy  Today you received the following chemotherapy agents Taxol and Carboplatin. To help prevent nausea and vomiting after your treatment, we encourage you to take your nausea medication as directed.  If you develop nausea and vomiting that is not controlled by your nausea medication, call the clinic.   BELOW ARE SYMPTOMS THAT SHOULD BE REPORTED IMMEDIATELY:  *FEVER GREATER THAN 100.5 F  *CHILLS WITH OR WITHOUT FEVER  NAUSEA AND VOMITING THAT IS NOT CONTROLLED WITH YOUR NAUSEA MEDICATION  *UNUSUAL SHORTNESS OF BREATH  *UNUSUAL BRUISING OR BLEEDING  TENDERNESS IN MOUTH AND THROAT WITH OR WITHOUT PRESENCE OF ULCERS  *URINARY PROBLEMS  *BOWEL PROBLEMS  UNUSUAL RASH Items with * indicate a potential emergency and should be followed up as soon as possible.  Feel free to call the clinic you have any questions or concerns. The clinic phone number is [phone].  Please show the CHEMO ALERT CARD at check-in to the Emergency Department and triage nurse.    

## 2016-01-16 NOTE — Patient Instructions (Signed)
Insulin Sliding Scale  Please take 2 units of Insulin aspart blood sugar of 150 premeals and an additional 2 units for every 50 blood sugar above 150 (premeals) So if Blood sugar premeals is 150 -- you would take 2 units of Insulin aspart, if its 200 you would take 4 units , if its 250 you would take 6 units , if its 300 you would take 8 units , if its 350 you would take 10units.

## 2016-01-19 ENCOUNTER — Ambulatory Visit
Admission: RE | Admit: 2016-01-19 | Discharge: 2016-01-19 | Disposition: A | Discharge status: Home / Self Care | Payer: BC Managed Care – PPO | Source: Ambulatory Visit | Attending: Internal Medicine | Admitting: Internal Medicine

## 2016-01-19 ENCOUNTER — Ambulatory Visit (HOSPITAL_BASED_OUTPATIENT_CLINIC_OR_DEPARTMENT_OTHER): Payer: BC Managed Care – PPO

## 2016-01-19 ENCOUNTER — Ambulatory Visit: Payer: BC Managed Care – PPO | Admitting: Nutrition

## 2016-01-19 ENCOUNTER — Ambulatory Visit
Admission: RE | Admit: 2016-01-19 | Discharge: 2016-01-19 | Disposition: A | Discharge status: Home / Self Care | Payer: BC Managed Care – PPO | Source: Ambulatory Visit | Attending: Radiation Oncology | Admitting: Radiation Oncology

## 2016-01-19 VITALS — BP 117/72 | HR 93 | Temp 98.0°F | Resp 17

## 2016-01-19 DIAGNOSIS — C3491 Malignant neoplasm of unspecified part of right bronchus or lung: Secondary | ICD-10-CM | POA: Diagnosis not present

## 2016-01-19 DIAGNOSIS — Z95828 Presence of other vascular implants and grafts: Secondary | ICD-10-CM

## 2016-01-19 DIAGNOSIS — Z515 Encounter for palliative care: Secondary | ICD-10-CM

## 2016-01-19 DIAGNOSIS — Z452 Encounter for adjustment and management of vascular access device: Secondary | ICD-10-CM | POA: Diagnosis not present

## 2016-01-19 DIAGNOSIS — C3490 Malignant neoplasm of unspecified part of unspecified bronchus or lung: Secondary | ICD-10-CM

## 2016-01-19 DIAGNOSIS — Z510 Encounter for antineoplastic radiation therapy: Secondary | ICD-10-CM | POA: Diagnosis not present

## 2016-01-19 MED ORDER — HEPARIN SOD (PORK) LOCK FLUSH 100 UNIT/ML IV SOLN
500.0000 [IU] | Freq: Once | INTRAVENOUS | Status: AC | PRN
  Administered 2016-01-19: 250 [IU] via INTRAVENOUS
  Filled 2016-01-19: qty 5

## 2016-01-19 MED ORDER — SODIUM CHLORIDE 0.9 % IJ SOLN
10.0000 mL | INTRAMUSCULAR | Status: DC | PRN
  Administered 2016-01-19: 10 mL via INTRAVENOUS
  Filled 2016-01-19: qty 10

## 2016-01-19 NOTE — Consult Note (Signed)
Consultation Note Date: 01/19/2016   Patient Name: Marilyn [name]  DOB: [birth year]  MRN: [id number]  Age / Sex: 50 y.o., female  PCP: Nolene Ebbs, MD Referring Physician: Knox Royalty, NP  Reason for Consultation: Psychosocial/spiritual support  HPI/Patient Profile: 50 y.o. female  with past medical history hypertension, diabetes, dyslipidemia, morbid obesity, asthma.    (per oncology note) Increasing chest pain shortness of breath and worsening cough over the last 3-4 months. Patient notes she was in the emergency room in February 2017 with some chest pain and had a CTA of the chest - she was noted to have no evidence of pulmonary embolism or aortic dissection. There was minimal infiltrative changes within the right hilar area and some mediastinal adenopathy. She was recommended repeat imaging in 3-6 months with her primary care physician. Patient notes that she never really followed up with her primary care physician for this.  Patient was seen in the emergency room early morning on 12/26/2015 with worsening chest pain shortness of breath and cough and had a CTA of the chest repeated. This showed no evidence of pulmonary embolism but she was noted to have a large 7.2 x 6 x 4.8 cm mass in the right paratracheal region which is markedly enlarged from the previous study and partially encases the right side of the trachea and the right mainstem bronchus with mild mass effect on the right side of the trachea. She is also noted to have right hilar and other mediastinal adenopathy.  Admitted for an expedited workup given threatened airway and other high impact organ threatening disease -to speed up the diagnosis and optimize time to treatment.   IMPRESSION: PET 01-12-16 1. Hypermetabolic 1.7 cm peripheral right hilar nodule. Given the peripheral location of this nodule within the right hilum, this could potentially  represent a centrally located primary bronchogenic malignancy. 2. Hypermetabolic bulky ipsilateral paratracheal, bilateral prevascular mediastinal and bilateral scalene nodal metastases. 3. Hypermetabolic right lower neck nodal metastases involving right neck nodal levels III, IV and V. 4. No hypermetabolic metastatic disease in the abdomen, pelvis or skeleton. 5. Aortic atherosclerosis.  Diagnosis -Biopsy 12-29-15 Lymph node, needle/core biopsy, Right supraclavicular LN - METASTATIC NON SMALL CELL CARCINOMA.  Under the medical supervision of Dr Irene Limbo and Dr Tammi Klippel  She has been connected with  SW, Spiritual care, nutrition       Clinical Assessment and Goals of Care:  This NP Wadie Lessen reviewed medical records, received report from team, assessed the patient and then meet with the patient and her mother in the OP radiation-oncology clinic discuss diagnosis, introduce the role of Palliative Medicine into a holistic care plan, answer questions and address concerns and assist in coordination of care.  Values and goals of care important to patient and family were attempted to be elicited.   Marilyn [name] and her family face the physical, emotional and financial stresses of  living life within the context  serious life limiting disease.  We discussed briefly the impact of her diagnosis on her family, three  grown children, several grand-children and her mother.  She verbalizes that she would like to bring her children next week to see me.   Appointment scheduled for 0800 next Monday    Questions and concerns addressed.   Family encouraged to call with questions or concerns.  PMT will re-meet with Marilyn [name] again on Monday morning         Primary Diagnoses: Present on Admission: **None**   I have reviewed the medical record, interviewed the patient and family, and examined the patient. The following aspects are pertinent.  Past Medical History:  Diagnosis Date  . Asthma     . Diabetes mellitus   . Hypercholesteremia   . Hypertension   . Lung cancer ([place])   . Neuropathy ([place])   . Obese    Social History   Social History  . Marital status: Divorced    Spouse name: N/A  . Number of children: N/A  . Years of education: N/A   Social History Main Topics  . Smoking status: Never Smoker  . Smokeless tobacco: Never Used  . Alcohol use No  . Drug use: No  . Sexual activity: Not Currently   Other Topics Concern  . Not on file   Social History Narrative   No bird or mold exposure. No recent travel.   Family History  Problem Relation Age of Onset  . Cancer Neg Hx   . Rheumatologic disease Neg Hx    Scheduled Meds: Continuous Infusions: PRN Meds:. Medications Prior to Admission:  Prior to Admission medications   Medication Sig Start Date End Date Taking? Authorizing Provider  aspirin EC 81 MG tablet Take 1 tablet (81 mg total) by mouth daily. 01/16/16   [name], MD  chlorhexidine (PERIDEX) 0.12 % solution Use as directed 15 mLs in the mouth or throat 2 (two) times daily. 01/05/16   [name], MD  dexamethasone (DECADRON) 4 MG tablet Take 2 tablets (8 mg total) by mouth daily. Start the day after chemotherapy for 2 days. 01/01/16   [name], MD  Fluocinonide 0.1 % CREA  01/06/16   Historical Provider, MD  HYDROcodone-acetaminophen (HYCET) 7.5-325 mg/15 ml solution Take 15 mLs by mouth 4 (four) times daily as needed for moderate pain. 12/31/15 12/30/16  Hayden Pedro, PA-C  insulin aspart (NOVOLOG) 100 UNIT/ML FlexPen As per sliding scale instructions 01/16/16   [name], MD  Insulin Degludec (TRESIBA FLEXTOUCH) 200 UNIT/ML SOPN Inject 100 Units into the skin daily before breakfast.     Historical Provider, MD  lidocaine-prilocaine (EMLA) cream Apply to affected area once 01/01/16   [name], MD  metoprolol succinate (TOPROL-XL) 25 MG 24 hr tablet Take 1 tablet (25 mg total) by mouth daily.  01/13/16   Susanne Borders, NP  ondansetron (ZOFRAN) 8 MG tablet Take 1 tablet (8 mg total) by mouth 2 (two) times daily as needed for refractory nausea / vomiting. Start on day 3 after chemo. 01/01/16   [name], MD  prochlorperazine (COMPAZINE) 10 MG tablet Take 1 tablet (10 mg total) by mouth every 6 (six) hours as needed (Nausea or vomiting). 01/01/16   [name], MD  Wound Dressings (SONAFINE) Apply 1 application topically 2 (two) times daily.    Historical Provider, MD   No Known Allergies Review of Systems  Constitutional: Positive for fatigue.  Neurological: Positive for weakness.    Physical Exam  Constitutional: She appears well-developed. She appears ill.  -with  obesity  HENT:  Mouth/Throat: Mucous membranes are normal. Abnormal dentition. Dental caries present.  Neurological: She is alert.  Skin: Skin is warm and dry.    Vital Signs: LMP 12/19/2015          SpO2:   O2 Device:  O2 Flow Rate: .   IO: Intake/output summary: No intake or output data in the 24 hours ending 01/19/16 1939  LBM:   Baseline Weight:   Most recent weight:       Palliative Assessment/Data: 80 %   Discussed with Joaquim Lai RN  Time In: 1015 Time Out: 1115 Time Total: 51mn Greater than 50%  of this time was spent counseling and coordinating care related to the above assessment and plan.  Signed by: LWadie Lessen NP   Please contact Palliative Medicine Team phone at [phone] questions and concerns.  For individual provider: See AShea Evans

## 2016-01-19 NOTE — Patient Instructions (Signed)
PICC Home Guide A peripherally inserted central catheter (PICC) is a long, thin, flexible tube that is inserted into a vein in the upper arm. It is a form of intravenous (IV) access. It is considered to be a "central" line because the tip of the PICC ends in a large vein in your chest. This large vein is called the superior vena cava (SVC). The PICC tip ends in the SVC because there is a lot of blood flow in the SVC. This allows medicines and IV fluids to be quickly distributed throughout the body. The PICC is inserted using a sterile technique by a specially trained nurse or physician. After the PICC is inserted, a chest X-ray exam is done to be sure it is in the correct place.  A PICC may be placed for different reasons, such as:  To give medicines and liquid nutrition that can only be given through a central line. Examples are:  Certain antibiotic treatments.  Chemotherapy.  Total parenteral nutrition (TPN).  To take frequent blood samples.  To give IV fluids and blood products.  If there is difficulty placing a peripheral intravenous (PIV) catheter. If taken care of properly, a PICC can remain in place for several months. A PICC can also allow a person to go home from the hospital early. Medicine and PICC care can be managed at home by a family member or home health care team. WHAT PROBLEMS CAN HAPPEN WHEN I HAVE A PICC? Problems with a PICC can occasionally occur. These may include the following:  A blood clot (thrombus) forming in or at the tip of the PICC. This can cause the PICC to become clogged. A clot-dissolving medicine called tissue plasminogen activator (tPA) can be given through the PICC to help break up the clot.  Inflammation of the vein (phlebitis) in which the PICC is placed. Signs of inflammation may include redness, pain at the insertion site, red streaks, or being able to feel a "cord" in the vein where the PICC is located.  Infection in the PICC or at the insertion  site. Signs of infection may include fever, chills, redness, swelling, or pus drainage from the PICC insertion site.  PICC movement (malposition). The PICC tip may move from its original position due to excessive physical activity, forceful coughing, sneezing, or vomiting.  A break or cut in the PICC. It is important to not use scissors near the PICC.  Nerve or tendon irritation or injury during PICC insertion. WHAT SHOULD I KEEP IN MIND ABOUT ACTIVITIES WHEN I HAVE A PICC?  You may bend your arm and move it freely. If your PICC is near or at the bend of your elbow, avoid activity with repeated motion at the elbow.  Rest at home for the remainder of the day following PICC line insertion.  Avoid lifting heavy objects as instructed by your health care provider.  Avoid using a crutch with the arm on the same side as your PICC. You may need to use a walker. WHAT SHOULD I KNOW ABOUT MY PICC DRESSING?  Keep your PICC bandage (dressing) clean and dry to prevent infection.  Ask your health care provider when you may shower. Ask your health care provider to teach you how to wrap the PICC when you do take a shower.  Change the PICC dressing as instructed by your health care provider.  Change your PICC dressing if it becomes loose or wet. WHAT SHOULD I KNOW ABOUT PICC CARE?  Check the PICC insertion site   daily for leakage, redness, swelling, or pain.  Do not take a bath, swim, or use hot tubs when you have a PICC. Cover PICC line with clear plastic wrap and tape to keep it dry while showering.  Flush the PICC as directed by your health care provider. Let your health care provider know right away if the PICC is difficult to flush or does not flush. Do not use force to flush the PICC.  Do not use a syringe that is less than 10 mL to flush the PICC.  Never pull or tug on the PICC.  Avoid blood pressure checks on the arm with the PICC.  Keep your PICC identification card with you at all  times.  Do not take the PICC out yourself. Only a trained clinical professional should remove the PICC. SEEK IMMEDIATE MEDICAL CARE IF:  Your PICC is accidentally pulled all the way out. If this happens, cover the insertion site with a bandage or gauze dressing. Do not throw the PICC away. Your health care provider will need to inspect it.  Your PICC was tugged or pulled and has partially come out. Do not  push the PICC back in.  There is any type of drainage, redness, or swelling where the PICC enters the skin.  You cannot flush the PICC, it is difficult to flush, or the PICC leaks around the insertion site when it is flushed.  You hear a "flushing" sound when the PICC is flushed.  You have pain, discomfort, or numbness in your arm, shoulder, or jaw on the same side as the PICC.  You feel your heart "racing" or skipping beats.  You notice a hole or tear in the PICC.  You develop chills or a fever. MAKE SURE YOU:   Understand these instructions.  Will watch your condition.  Will get help right away if you are not doing well or get worse.   This information is not intended to replace advice given to you by your health care provider. Make sure you discuss any questions you have with your health care provider.   Document Released: 09/26/2002 Document Revised: 04/12/2014 Document Reviewed: 11/27/2012 Elsevier Interactive Patient Education 2016 [employer].  

## 2016-01-19 NOTE — Progress Notes (Signed)
50 year old female diagnosed with non-small cell lung cancer.  She is a patient of Dr. Irene Limbo and Dr. Tammi Klippel.  Past medical history includes obesity, hypertension, hypercholesterolemia, diabetes, and asthma.  Medications include Decadron, Zofran, Compazine.  Labs include albumin 2.5.  Height: 5 feet 9 inches. Weight: 285.8 pounds on October 12. Usual body weight: 298 pounds September 21. BMI: 42.2.  Patient reports she has difficulty swallowing and requires pured foods. She also reports constipation. Patient states she has gained back some of the weight she lost.  Nutrition diagnosis: Swallowing difficulty related to radiation therapy for lung cancer as evidenced by dysphasia and need for pured foods.  Intervention:  Educated patient to continue pureing foods and adding gravies and sauces as tolerated. Encouraged patient to consume protein 5-6 times daily. Brief education provided on diabetic diet. Educated patient on strategies for improving constipation. Recommended increased water intake. Questions were answered.  Teach back method used.  Contact information given.  Monitoring, evaluation, goals:  Patient will tolerate adequate calories and protein to maintain lean body mass.  Next visit: Patient will contact me for follow-up as needed.  **Disclaimer: This note was dictated with voice recognition software. Similar sounding words can inadvertently be transcribed and this note may contain transcription errors which may not have been corrected upon publication of note.**

## 2016-01-20 ENCOUNTER — Encounter (HOSPITAL_COMMUNITY): Payer: Self-pay

## 2016-01-20 ENCOUNTER — Ambulatory Visit
Admission: RE | Admit: 2016-01-20 | Discharge: 2016-01-20 | Disposition: A | Discharge status: Home / Self Care | Payer: BC Managed Care – PPO | Source: Ambulatory Visit | Attending: Radiation Oncology | Admitting: Radiation Oncology

## 2016-01-20 DIAGNOSIS — Z510 Encounter for antineoplastic radiation therapy: Secondary | ICD-10-CM | POA: Diagnosis not present

## 2016-01-21 ENCOUNTER — Telehealth: Payer: Self-pay

## 2016-01-21 ENCOUNTER — Ambulatory Visit
Admission: RE | Admit: 2016-01-21 | Discharge: 2016-01-21 | Disposition: A | Discharge status: Home / Self Care | Payer: BC Managed Care – PPO | Source: Ambulatory Visit | Attending: Radiation Oncology | Admitting: Radiation Oncology

## 2016-01-21 DIAGNOSIS — Z510 Encounter for antineoplastic radiation therapy: Secondary | ICD-10-CM | POA: Diagnosis not present

## 2016-01-21 NOTE — Telephone Encounter (Signed)
Disability form faxed to [facility].

## 2016-01-21 NOTE — Telephone Encounter (Signed)
Faxed FMLA paperwork to [place] [employer] [facility]

## 2016-01-22 ENCOUNTER — Ambulatory Visit
Admission: RE | Admit: 2016-01-22 | Discharge: 2016-01-22 | Disposition: A | Discharge status: Home / Self Care | Payer: BC Managed Care – PPO | Source: Ambulatory Visit | Attending: Radiation Oncology | Admitting: Radiation Oncology

## 2016-01-22 VITALS — BP 117/69 | HR 88 | Resp 18 | Wt 283.8 lb

## 2016-01-22 DIAGNOSIS — Z510 Encounter for antineoplastic radiation therapy: Secondary | ICD-10-CM | POA: Diagnosis not present

## 2016-01-22 DIAGNOSIS — C3491 Malignant neoplasm of unspecified part of right bronchus or lung: Secondary | ICD-10-CM

## 2016-01-22 MED ORDER — SUCRALFATE 1 G PO TABS: 1.0000 g | ORAL_TABLET | Freq: Three times a day (TID) | ORAL | 2 refills | Status: DC

## 2016-01-22 NOTE — Progress Notes (Signed)
Weight and vitals stable. Reports pain and difficulty associated with swallowing. Reports shortness of breath seems less since starting radiation. Denies cough. Denies skin changes within treatment field. Reports using sonafine as directed. Reports mild fatigue. Scheduled for third chemo tomorrow.  BP 117/69 (BP Location: Left Arm, Patient Position: Sitting, Cuff Size: Large)   Pulse 88   Resp 18   Wt 283 lb 12.8 oz (128.7 kg)   LMP 12/19/2015   SpO2 100%   BMI 41.91 kg/m  Wt Readings from Last 3 Encounters:  01/22/16 283 lb 12.8 oz (128.7 kg)  01/16/16 286 lb (129.7 kg)  01/15/16 285 lb 12.8 oz (129.6 kg)

## 2016-01-22 NOTE — Progress Notes (Signed)
Marilyn [name] Kitchen    HEMATOLOGY/ONCOLOGY CLINIC NOTE  Date of Service: .01/16/2016  Patient Care Team: Nolene Ebbs, MD as PCP - General (Internal Medicine)  CHIEF COMPLAINTS/PURPOSE OF CONSULTATION:  Newly diagnosed Lung Adenocarcinoma.  HISTORY OF PRESENTING ILLNESS: Plz see my previous note for details on initial presentation.  INTERVAL HISTORY  Patient is here for her scheduled f/u for toxicity check. PET/CT did not show any adrenal or other distal mets. Tolerating chemotherapy and RT without any acute issues at this time. No other acute new symptoms. Some weight loss. Working on paperwork for social security disability.   MEDICAL HISTORY:  Past Medical History:  Diagnosis Date  . Asthma   . Diabetes mellitus   . Hypercholesteremia   . Hypertension   . Lung cancer ([place])   . Neuropathy ([place])   . Obese     SURGICAL HISTORY: Past Surgical History:  Procedure Laterality Date  . CESAREAN SECTION    . IR GENERIC HISTORICAL  01/08/2016   IR US GUIDE VASC ACCESS RIGHT 01/08/2016 [name], DO [facility]  . IR GENERIC HISTORICAL  01/08/2016   IR FLUORO GUIDE CV LINE RIGHT 01/08/2016 [name], DO [facility]  . TUBAL LIGATION    . US guided core needle biopsy     of right lower neck/supraclavicular lymph nodes    SOCIAL HISTORY: Social History   Social History  . Marital status: Divorced    Spouse name: N/A  . Number of children: N/A  . Years of education: N/A   Occupational History  . Not on file.   Social History Main Topics  . Smoking status: Never Smoker  . Smokeless tobacco: Never Used  . Alcohol use No  . Drug use: No  . Sexual activity: Not Currently   Other Topics Concern  . Not on file   Social History Narrative   No bird or mold exposure. No recent travel.    FAMILY HISTORY: Family History  Problem Relation Age of Onset  . Cancer Neg Hx   . Rheumatologic disease Neg Hx     ALLERGIES:  has No Known Allergies.  MEDICATIONS:  Current  Outpatient Prescriptions  Medication Sig Dispense Refill  . chlorhexidine (PERIDEX) 0.12 % solution Use as directed 15 mLs in the mouth or throat 2 (two) times daily. 120 mL 0  . dexamethasone (DECADRON) 4 MG tablet Take 2 tablets (8 mg total) by mouth daily. Start the day after chemotherapy for 2 days. 30 tablet 1  . Fluocinonide 0.1 % CREA     . HYDROcodone-acetaminophen (HYCET) 7.5-325 mg/15 ml solution Take 15 mLs by mouth 4 (four) times daily as needed for moderate pain. 473 mL 0  . Insulin Degludec (TRESIBA FLEXTOUCH) 200 UNIT/ML SOPN Inject 100 Units into the skin daily before breakfast.     . lidocaine-prilocaine (EMLA) cream Apply to affected area once 30 g 3  . metoprolol succinate (TOPROL-XL) 25 MG 24 hr tablet Take 1 tablet (25 mg total) by mouth daily. 30 tablet 0  . ondansetron (ZOFRAN) 8 MG tablet Take 1 tablet (8 mg total) by mouth 2 (two) times daily as needed for refractory nausea / vomiting. Start on day 3 after chemo. 30 tablet 1  . prochlorperazine (COMPAZINE) 10 MG tablet Take 1 tablet (10 mg total) by mouth every 6 (six) hours as needed (Nausea or vomiting). 30 tablet 1  . Wound Dressings (SONAFINE) Apply 1 application topically 2 (two) times daily.    Marilyn [name] Kitchen aspirin EC 81 MG tablet  Take 1 tablet (81 mg total) by mouth daily. 60 tablet 2  . insulin aspart (NOVOLOG) 100 UNIT/ML FlexPen As per sliding scale instructions 15 mL 2   No current facility-administered medications for this visit.     REVIEW OF SYSTEMS:    10 Point review of Systems was done is negative except as noted above.  PHYSICAL EXAMINATION: ECOG PERFORMANCE STATUS: 1 - Symptomatic but completely ambulatory  . Vitals:   01/16/16 1008  BP: 114/70  Pulse: 97  Resp: 19  Temp: 98.7 F (37.1 C)   Filed Weights   01/16/16 1008  Weight: 286 lb (129.7 kg)   .Body mass index is 42.23 kg/m.  GENERAL:alert, in no acute distress and comfortable SKIN: skin color, texture, turgor are normal, no rashes or  significant lesions EYES: normal, conjunctiva are pink and non-injected, sclera clear OROPHARYNX:no exudate, no erythema and lips, buccal mucosa, and tongue normal  NECK: supple, no JVD, thyroid normal size, non-tender, without nodularity LYMPH:  no palpable lymphadenopathy in the cervical, axillary or inguinal. Her right paratracheal mass appears to be extending into the right supraclavicular space and is palpable clinically.               LUNGS: clear to auscultation with normal respiratory effort. Distant breath sounds  HEART: regular rate & rhythm,  no murmurs and no lower extremity edema ABDOMEN: abdomen obese, soft, non-tender, normoactive bowel sounds  Musculoskeletal: no cyanosis of digits and no clubbing  PSYCH: alert & oriented x 3 with fluent speech NEURO: no focal motor/sensory deficits  LABORATORY DATA:  I have reviewed the data as listed  . CBC Latest Ref Rng & Units 01/16/2016 01/13/2016 01/08/2016  WBC 3.9 - 10.3 10e3/uL 6.7 5.7 11.8(H)  Hemoglobin 11.6 - 15.9 g/dL 10.0(L) 10.4(L) 10.3(L)  Hematocrit 34.8 - 46.6 % 31.4(L) 32.3(L) 31.6(L)  Platelets 145 - 400 10e3/uL 292 382 423(H)    . CMP Latest Ref Rng & Units 01/16/2016 01/13/2016 01/08/2016  Glucose 70 - 140 mg/dl 268(H) 99 185(H)  BUN 7.0 - 26.0 mg/dL 8.5 11.2 8  Creatinine 0.6 - 1.1 mg/dL 0.8 0.6 0.58  Sodium 136 - 145 mEq/L 139 141 136  Potassium 3.5 - 5.1 mEq/L 4.4 4.0 3.8  Chloride 101 - 111 mmol/L - - 103  CO2 22 - 29 mEq/L '27 28 25  ' Calcium 8.4 - 10.4 mg/dL 9.0 8.5 8.9  Total Protein 6.4 - 8.3 g/dL 7.1 6.9 -  Total Bilirubin 0.20 - 1.20 mg/dL <0.22 <0.30 -  Alkaline Phos 40 - 150 U/L 98 99 -  AST 5 - 34 U/L 10 10 -  ALT 0 - 55 U/L 8 10 -           RADIOGRAPHIC STUDIES: I have personally reviewed the radiological images as listed and agreed with the findings in the report.   IMPRESSION: 1. Hypermetabolic 1.7 cm peripheral right hilar nodule. Given the peripheral location of this nodule  within the right hilum, this could potentially represent a centrally located primary bronchogenic malignancy. 2. Hypermetabolic bulky ipsilateral paratracheal, bilateral prevascular mediastinal and bilateral scalene nodal metastases. 3. Hypermetabolic right lower neck nodal metastases involving right neck nodal levels III, IV and V. 4. No hypermetabolic metastatic disease in the abdomen, pelvis or skeleton. 5. Aortic atherosclerosis.   Electronically Signed   By: Ilona Sorrel M.D.   On: 01/12/2016 09:42    Dg Chest 2 View  Result Date: 12/25/2015 CLINICAL DATA:  Generalize chronic chest pain, shortness of breath,  and cough. History of asthma, diabetes, and hypertension. EXAM: CHEST  2 VIEW COMPARISON:  09/15/2015 FINDINGS: Right hilar and right peritracheal lymphadenopathy is demonstrated similar to prior study. Lungs are clear and expanded. No focal airspace disease or consolidation. No blunting of costophrenic angles. No pneumothorax. Normal heart size and pulmonary vascularity. Mild degenerative changes in the spine. IMPRESSION: Right hilar and right paratracheal lymphadenopathy is demonstrated, similar to prior study. No evidence of active pulmonary disease. Electronically Signed   By: Lucienne Capers M.D.   On: 12/25/2015 21:23   Ct Soft Tissue Neck W Contrast  Result Date: 12/27/2015 CLINICAL DATA:  Sore throat with right-sided swelling. Mediastinal mass on chest CT. EXAM: CT NECK WITH CONTRAST TECHNIQUE: Multidetector CT imaging of the neck was performed using the standard protocol following the bolus administration of intravenous contrast. CONTRAST:  126m ISOVUE-300 IOPAMIDOL (ISOVUE-300) INJECTION 61% COMPARISON:  CTA neck 05/30/2015 FINDINGS: Pharynx and larynx: No pharyngeal mass or inflammatory change. Unremarkable larynx. Salivary glands: Submandibular and parotid glands are unremarkable. Thyroid: Unremarkable. Lymph nodes: There are bulky right level IV and supraclavicular  lymph nodes which are new from the prior CTA. These measure up to at least 2 cm in short axis, with evaluation mildly limited by beam hardening. Enlarged right level V lymph nodes are also new and measure up to 1.5 cm in short axis. Subcentimeter short axis lymph nodes in the right neck more superiorly and scattered throughout the left neck (including an 8 mm short axis left level IV lymph node) are all similar to the prior CTA. Vascular: Major vascular structures of the neck appear patent. 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 Limited intracranial: Unremarkable. Visualized orbits: Not imaged. Mastoids and visualized paranasal sinuses: Clear. Skeleton: Mild lower cervical spondylosis. No destructive osseous lesion identified. subcentimeter sclerotic focus in the base of the C2 spinous processes unchanged, possibly a bone island. Upper chest: Bulky mediastinal lymphadenopathy, more fully evaluated on yesterday's chest CT. Other: None. IMPRESSION: 1. Unremarkable appearance of the pharynx. No evidence of acute process to explain sore throat. 2. Bulky lymphadenopathy in the right lower neck and supraclavicular regions, consistent with metastatic disease. Electronically Signed   By: ALogan BoresM.D.   On: 12/27/2015 14:08   Ct Angio Chest Pe W Or Wo Contrast  Result Date: 12/26/2015 CLINICAL DATA:  Acute onset of generalized chest pain, tachycardia, high blood pressure and shortness of breath. Initial encounter. EXAM: CT ANGIOGRAPHY CHEST WITH CONTRAST TECHNIQUE: Multidetector CT imaging of the chest was performed using the standard protocol during bolus administration of intravenous contrast. Multiplanar CT image reconstructions and MIPs were obtained to evaluate the vascular anatomy. CONTRAST:  100 mL of Isovue 370 IV contrast COMPARISON:  Chest radiograph performed 12/25/2015, and CTA of the chest performed 05/30/2015 FINDINGS: Cardiovascular: There is no evidence of  pulmonary embolus. There is mild mass effect on vasculature from the patient's mediastinal and right hilar masses. The thoracic aorta remains grossly intact. The great vessels are grossly unremarkable in appearance. Mediastinum/Nodes: There is a large 7.2 x 6.0 x 4.8 cm mass at the right paratracheal region, markedly enlarged from the prior study, partially encasing the right side of the trachea and the right mainstem bronchus, highly suspicious for small cell lung cancer. There is mild mass effect on the right side of the trachea. Right hilar and azygoesophageal recess nodes measure up to 2.0 cm in short axis, concerning for metastatic disease. No pericardial effusion is identified. Scattered prominent  anterior mediastinal nodes are seen, measuring up to 1.7 cm in short axis. Lungs/Pleura: Bibasilar atelectasis is noted. No pleural effusion or pneumothorax is seen. No pulmonary nodules are identified. Upper Abdomen: The visualized portions of the liver and spleen are grossly unremarkable. Musculoskeletal: No acute osseous abnormalities are identified. The visualized musculature is unremarkable in appearance. Review of the MIP images confirms the above findings. IMPRESSION: 1. No evidence of pulmonary embolus. 2. 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 Mild mass-effect on the right side of the trachea. 3. Right hilar and azygoesophageal recess nodes measure up to 2.0 cm in short axis. Prominent anterior mediastinal nodes measure up to 1.7 cm in short axis. These are concerning for metastatic disease. 4. Bibasilar atelectasis noted. These results were called by telephone at the time of interpretation on 12/26/2015 at 5:37 am to [facility] PA, who verbally acknowledged these results. Electronically Signed   By: Garald Balding  M.D.   On: 12/26/2015 05:40   Mr Jeri Cos GG Contrast  Result Date: 12/26/2015 CLINICAL DATA:  Lung cancer. Headache. Assess for metastatic disease. EXAM: MRI HEAD WITHOUT AND WITH CONTRAST TECHNIQUE: Multiplanar, multiecho pulse sequences of the brain and surrounding structures were obtained without and with intravenous contrast. CONTRAST:  34m MULTIHANCE GADOBENATE DIMEGLUMINE 529 MG/ML IV SOLN COMPARISON:  CT 05/30/2015 FINDINGS: Brain: The brain has normal appearance without evidence of malformation, atrophy, old or acute small or large vessel infarction, hemorrhage, hydrocephalus or extra-axial collection. No pituitary abnormality. After contrast administration, no abnormal enhancement occurs. Vascular: Major vessels at the base of the brain show flow. Skull and upper cervical spine: Normal Sinuses/Orbits: Clear/ normal. Other: None significant. IMPRESSION: Normal examination. No evidence of metastatic disease. No other finding to explain headache. Electronically Signed   By: MNelson ChimesM.D.   On: 12/26/2015 11:27   Ct Abdomen Pelvis W Contrast  Addendum Date: 12/27/2015   ADDENDUM REPORT: 12/27/2015 14:12 ADDENDUM: There is questionable sludge within the gallbladder. It may be prudent to consider ultrasound of the right upper quadrant given questionable sludge in the gallbladder as well as prominence of the common bile duct. Electronically Signed   By: WLowella GripIII M.D.   On: 12/27/2015 14:12   Result Date: 12/27/2015 CLINICAL DATA:  Chest adenopathy with concern for metastatic focus EXAM: CT ABDOMEN AND PELVIS WITH CONTRAST TECHNIQUE: Multidetector CT imaging of the abdomen and pelvis was performed using the standard protocol following bolus administration of intravenous contrast. Oral contrast was also administered. CONTRAST:  1021mISOVUE-300 IOPAMIDOL (ISOVUE-300) INJECTION 61% COMPARISON:  CT abdomen and pelvis May 18, 2010; chest CT December 26, 2015 FINDINGS: Lower chest:  Focal opacity in the posterior aspect of the superior segment right lower lobe is increased from 1 day prior and is consistent with either increased atelectasis or new focus infiltrate in this area. Lung bases otherwise are clear. Hepatobiliary: No focal liver lesions are evident. There is no appreciable gallbladder wall thickening. There is prominence of the common bile duct 10 mm without mass or calculus evident in the common bile duct on this study. Pancreas: There is no pancreatic mass or fluid collection. Spleen: No splenic lesions are evident. Adrenals/Urinary Tract: There is a 1.1 x 0.9 cm mass in the left adrenal. Right adrenal appears normal. Kidneys bilaterally show no demonstrable mass or  hydronephrosis on either side. There is no renal or ureteral calculus on either side. Urinary bladder is midline with wall thickness within normal limits. Stomach/Bowel: There is fairly diffuse stool throughout colon. There is no bowel wall or mesenteric thickening. No bowel obstruction. No free air or portal venous air. Vascular/Lymphatic: There is no abdominal aortic aneurysm. No vascular lesions are evident. There are a few scattered subcentimeter lymph nodes in the mesentery. By size criteria, there is no adenopathy in the abdomen or pelvis. Reproductive: Uterus is retroverted. There is no pelvic mass or pelvic fluid collection. Other: Appendix appears unremarkable. There is no ascites or abscess in the abdomen or pelvis. There is a small ventral hernia containing only fat. No omental lesions are demonstrable. Musculoskeletal: There are foci of degenerative change in the lumbar spine. There are no blastic or lytic bone lesions. There is no intramuscular or abdominal wall lesion evident. IMPRESSION: Small mass in left adrenal. A small metastasis must be of concern in this region given the findings on recent chest CT. No adenopathy is apparent in the abdomen or pelvis. There are scattered subcentimeter mesenteric lymph  nodes which do not meet size criteria for pathologic significance. No liver lesions are evident. No inflammatory foci are appreciated. No bowel obstruction. No abscess. No renal or ureteral calculus. No hydronephrosis. There is a small ventral hernia containing only fat. There is prominence of the common bile duct without mass or calculus evident. Significance of this finding is uncertain. Electronically Signed: By: Lowella Grip III M.D. On: 12/27/2015 14:08   US Biopsy  Result Date: 12/29/2015 EXAM: Ultrasound-guided core biopsy of the liver Interventional Radiologist:  Criselda Peaches, MD MEDICATIONS: None. ANESTHESIA/SEDATION: Fentanyl  mcg IV; Versed  mg IV Moderate Sedation Time: The patient was continuously monitored during the procedure by the interventional radiology nurse under my direct supervision. FLUOROSCOPY TIME:  Fluoroscopy Time:  minutes  seconds ( mGy). COMPLICATIONS: None immediate. PROCEDURE: Informed consent was obtained from the patient following explanation of the procedure, risks, benefits and alternatives. The patient understands, agrees and consents for the procedure. All questions were addressed. A time out was performed. The right upper quadrant was interrogated with ultrasound. A relatively avascular plane of the liver was identified. A suitable skin entry site was selected and marked. The region was then sterilely prepped and draped in standard fashion using chlorhexidine skin prep. Local anesthesia was attained by infiltration with 1% lidocaine. A small dermatotomy was made. Under real-time sonographic guidance, a 17 gauge trocar needle was advanced into the liver. Multiple 18 gauge core biopsies were then coaxially obtained. Needle placement was confirmed on all biopsy passes with real-time sonography. Biopsy specimens were placed in formalin and delivered to pathology for further analysis. As the introducer needle was removed, the biopsy tract was embolized with a  Gel-Foam slurry. Post biopsy ultrasound imaging demonstrates no active bleeding or perihepatic hematoma. The patient tolerated the procedure well. IMPRESSION: Technically successful ultrasound-guided random core biopsy of the liver. Electronically Signed   By: Jacqulynn Cadet M.D.   On: 12/29/2015 13:58   Dg Foot 2 Views Right  Result Date: 12/28/2015 CLINICAL DATA:  50 year old female with a history of right great toe pain EXAM: RIGHT FOOT - 2 VIEW COMPARISON:  None. FINDINGS: No displaced fracture identified.  No radiopaque foreign body. Degenerative changes of the interphalangeal joints. Minimal degenerative changes of the metatarsal-phalangeal joints. Degenerative changes of the midfoot and hindfoot. No focal soft tissue swelling. IMPRESSION: Negative for acute bony abnormality.  Signed, Dulcy Fanny. Earleen Newport, DO [facility] Electronically Signed   By: [name] D.O.   On: 12/28/2015 16:51   Dg Swallowing Func-speech Pathology  Result Date: 12/30/2015 Objective Swallowing Evaluation: Type of Study: MBS-Modified Barium Swallow Study Patient Details Name: MAMYE BOLDS MRN: [id number] Date of Birth: 07/23/1965 Today's Date: 12/30/2015 Time: SLP Start Time (ACUTE ONLY): 1405-SLP Stop Time (ACUTE ONLY): 1420 SLP Time Calculation (min) (ACUTE ONLY): 15 min Past Medical History: Past Medical History: Diagnosis Date . Asthma  . Diabetes mellitus  . Hypercholesteremia  . Hypertension  . Obese  Past Surgical History: Past Surgical History: Procedure Laterality Date . CESAREAN SECTION   . TUBAL LIGATION   HPI: Savannaha Stonerock  is a 50 y.o. female, Asked medical history of Asthma, insulin-dependent diabetes, high cholesterol, hypertension, obesity, presents to ED with complaints of chest pain, right-sided, intermittent, progressive over last few month, accompanied by shortness of breath, she denies any weight loss, any cough, any smoking history, any secondhand  smoking, CT chest with contrast significant for large paratracheal mass, surrounding trachea and right bronchus, patient tachycardic on admission. Subjective: "I have to turn my head to swallow." Assessment / Plan / Recommendation CHL IP CLINICAL IMPRESSIONS 12/30/2015 Therapy Diagnosis WFL;Suspected primary esophageal dysphagia Clinical Impression Pt seen for followup MBSS after concerns for possible aspiration noted at bedside. However, pt tolerated all consistencies WFL. Pt reported globus sensation with all solids and barium tablet- indicating the upper esophagus, however complete clearance was noted. Pt did continue to demonstrate intermittent L head turn with solids, however no transient slowing of the bolus passage noted. Question if presence of paratracheal mass is interfering with esophageal sensation. No further SLP indicated at this time. Pt verbalized understanding of the results and recommendations.   Impact on safety and function No limitations   CHL IP TREATMENT RECOMMENDATION 12/30/2015 Treatment Recommendations No treatment recommended at this time   Prognosis 12/30/2015 Prognosis for Safe Diet Advancement Good Barriers to Reach Goals -- Barriers/Prognosis Comment -- CHL IP DIET RECOMMENDATION 12/30/2015 SLP Diet Recommendations Regular solids;Thin liquid Liquid Administration via Cup;Straw Medication Administration Whole meds with liquid Compensations Small sips/bites;Follow solids with liquid Postural Changes Seated upright at 90 degrees   CHL IP OTHER RECOMMENDATIONS 12/30/2015 Recommended Consults -- Oral Care Recommendations Oral care BID Other Recommendations --   CHL IP FOLLOW UP RECOMMENDATIONS 12/30/2015 Follow up Recommendations None   No flowsheet data found.     CHL IP ORAL PHASE 12/30/2015 Oral Phase WFL Oral - Pudding Teaspoon -- Oral - Pudding Cup -- Oral - Honey Teaspoon -- Oral - Honey Cup -- Oral - Nectar Teaspoon -- Oral - Nectar Cup -- Oral - Nectar Straw -- Oral - Thin Teaspoon -- Oral  - Thin Cup -- Oral - Thin Straw -- Oral - Puree -- Oral - Mech Soft -- Oral - Regular -- Oral - Multi-Consistency -- Oral - Pill -- Oral Phase - Comment --  CHL IP PHARYNGEAL PHASE 12/30/2015 Pharyngeal Phase WFL Pharyngeal- Pudding Teaspoon -- Pharyngeal -- Pharyngeal- Pudding Cup -- Pharyngeal -- Pharyngeal- Honey Teaspoon -- Pharyngeal -- Pharyngeal- Honey Cup -- Pharyngeal -- Pharyngeal- Nectar Teaspoon -- Pharyngeal -- Pharyngeal- Nectar Cup -- Pharyngeal -- Pharyngeal- Nectar Straw -- Pharyngeal -- Pharyngeal- Thin Teaspoon -- Pharyngeal -- Pharyngeal- Thin Cup -- Pharyngeal -- Pharyngeal- Thin Straw -- Pharyngeal -- Pharyngeal- Puree -- Pharyngeal -- Pharyngeal- Mechanical Soft -- Pharyngeal -- Pharyngeal- Regular -- Pharyngeal -- Pharyngeal- Multi-consistency -- Pharyngeal -- Pharyngeal-  Pill -- Pharyngeal -- Pharyngeal Comment --  CHL IP CERVICAL ESOPHAGEAL PHASE 12/30/2015 Cervical Esophageal Phase WFL Pudding Teaspoon -- Pudding Cup -- Honey Teaspoon -- Honey Cup -- Nectar Teaspoon -- Nectar Cup -- Nectar Straw -- Thin Teaspoon -- Thin Cup -- Thin Straw -- Puree -- Mechanical Soft -- Regular -- Multi-consistency -- Pill -- Cervical Esophageal Comment -- No flowsheet data found. Vinetta Bergamo MA, [place] Pager [phone] 12/30/2015, 2:55 PM              US Abdomen Limited Ruq  Result Date: 12/28/2015 CLINICAL DATA:  Right upper quadrant pain EXAM: US ABDOMEN LIMITED - RIGHT UPPER QUADRANT COMPARISON:  CT abdomen pelvis dated 12/27/2015 FINDINGS: Gallbladder: Contracted gallbladder with suspected 9 mm gallstone. No gallbladder wall thickening or pericholecystic fluid. Negative sonographic Murphy's sign. Common bile duct: Diameter: 7 mm centrally. Liver: Coarse hepatic echotexture with hyperechoic hepatic parenchyma. No focal hepatic lesion is seen. IMPRESSION: Cholelithiasis, without associated sonographic findings to suggest acute cholecystitis. Common duct measures 7 mm centrally, perhaps mildly prominent.  However, in the absence of abnormal LFTs, this is of dubious clinical significance. Coarse hepatic echotexture with hyperechoic hepatic parenchyma, nonspecific, but raising the possibility of hepatic steatosis. Electronically Signed   By: Julian Hy M.D.   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   Noted to have a small mass in the left adrenal on CT but PET/CT neg for metastatic disease. Patient has been a lifelong nonsmoker  MRI of the brain was negative for any metastatic disease.   High PDL1 expression (90%) on foundation One Neg for EGFR, ALK, ROS-1 and BRAF mutations.  Plan -labs stable. No prohibitive toxicity from chemotherapy at this time. -continue weekly Carboplatin + Taxol while on RT and then for 2 x 3 weekly cycles after completion of RT. -if stable disease/response after these treatment would consider Durvalumab. -continue f/u with radiation oncology.  #2 Sinus tachycardia - on toprol XL , no PE on CTA. Maintain good hydration. Pain mx as needed.  #3 neoplasm related pain is currently controlled without significant medications. Clinically likely has sleep apnea . Would need to be careful with sedative medications . #3 hypertension   #4 diabetes #5 dyslipidemia #6 asthma Plan -giving insulin SS for uncontrolled hyperglycemia in the setting of steroid use with chemotherapy -continue f/u with PCP   RTC with Dr Irene Limbo 1 week after with labs.   to All of the patients questions were answered with apparent satisfaction. The patient knows to call the clinic with any problems, questions or concerns.  I spent 20  minutes counseling the patient face to face. The total time spent in the appointment was 25 minutes and more than 50% was on counseling and direct patient cares.    Sullivan Lone MD [place] AAHIVMS [facility] Hematology/Oncology Physician [facility]  (Office):       [phone] (Work cell):  [phone] (Fax):           [phone]

## 2016-01-22 NOTE — Progress Notes (Signed)
  Radiation Oncology         [phone]   Name: Marilyn [name] MRN: [id number]   Date: 01/22/2016  DOB: [birth year]   Weekly Radiation Therapy Management    ICD-9-CM ICD-10-CM   1. Non-small cell cancer of right lung (HCC) 162.9 C34.91     Current Dose: 28 Gy  Planned Dose:  44 Gy  Narrative The patient presents for routine under treatment assessment.  Weight and vitals stable. Reports pain and difficulty associated with swallowing. Reports shortness of breath seems less since starting radiation. Denies cough. Denies skin changes within treatment field. Reports using sonafine as directed. Reports mild fatigue. Scheduled for third chemo tomorrow.  Set-up films were reviewed. The chart was checked.  Physical Findings  weight is 283 lb 12.8 oz (128.7 kg). Her blood pressure is 117/69 and her pulse is 88. Her respiration is 18 and oxygen saturation is 100%. . Weight essentially stable.  No significant changes.  Impression The patient is tolerating radiation.  Plan Continue treatment as planned. Carafate prescribed today for sore throat.         Sheral Apley Tammi Klippel, M.D.  This document serves as a record of services personally performed by Tyler Pita, MD. It was created on his behalf by Arlyce Harman, a trained medical scribe. The creation of this record is based on the scribe's personal observations and the provider's statements to them. This document has been checked and approved by the attending provider.

## 2016-01-23 ENCOUNTER — Ambulatory Visit: Payer: BC Managed Care – PPO

## 2016-01-23 ENCOUNTER — Other Ambulatory Visit (HOSPITAL_BASED_OUTPATIENT_CLINIC_OR_DEPARTMENT_OTHER): Payer: BC Managed Care – PPO

## 2016-01-23 ENCOUNTER — Encounter: Payer: Self-pay | Admitting: Hematology

## 2016-01-23 ENCOUNTER — Ambulatory Visit (HOSPITAL_BASED_OUTPATIENT_CLINIC_OR_DEPARTMENT_OTHER): Payer: BC Managed Care – PPO | Admitting: Hematology

## 2016-01-23 ENCOUNTER — Ambulatory Visit
Admission: RE | Admit: 2016-01-23 | Discharge: 2016-01-23 | Disposition: A | Discharge status: Home / Self Care | Payer: BC Managed Care – PPO | Source: Ambulatory Visit | Attending: Radiation Oncology | Admitting: Radiation Oncology

## 2016-01-23 ENCOUNTER — Ambulatory Visit (HOSPITAL_BASED_OUTPATIENT_CLINIC_OR_DEPARTMENT_OTHER): Payer: BC Managed Care – PPO

## 2016-01-23 VITALS — BP 99/68 | HR 94 | Temp 98.6°F | Resp 18 | Ht 69.0 in | Wt 281.2 lb

## 2016-01-23 VITALS — BP 120/77 | HR 95 | Resp 19

## 2016-01-23 DIAGNOSIS — R000 Tachycardia, unspecified: Secondary | ICD-10-CM

## 2016-01-23 DIAGNOSIS — G893 Neoplasm related pain (acute) (chronic): Secondary | ICD-10-CM

## 2016-01-23 DIAGNOSIS — C3491 Malignant neoplasm of unspecified part of right bronchus or lung: Secondary | ICD-10-CM

## 2016-01-23 DIAGNOSIS — E279 Disorder of adrenal gland, unspecified: Secondary | ICD-10-CM

## 2016-01-23 DIAGNOSIS — Z5111 Encounter for antineoplastic chemotherapy: Secondary | ICD-10-CM | POA: Diagnosis not present

## 2016-01-23 DIAGNOSIS — Z95828 Presence of other vascular implants and grafts: Secondary | ICD-10-CM

## 2016-01-23 DIAGNOSIS — Z510 Encounter for antineoplastic radiation therapy: Secondary | ICD-10-CM | POA: Diagnosis not present

## 2016-01-23 DIAGNOSIS — I495 Sick sinus syndrome: Secondary | ICD-10-CM

## 2016-01-23 DIAGNOSIS — I10 Essential (primary) hypertension: Secondary | ICD-10-CM

## 2016-01-23 DIAGNOSIS — C770 Secondary and unspecified malignant neoplasm of lymph nodes of head, face and neck: Secondary | ICD-10-CM

## 2016-01-23 DIAGNOSIS — R739 Hyperglycemia, unspecified: Secondary | ICD-10-CM

## 2016-01-23 DIAGNOSIS — C3492 Malignant neoplasm of unspecified part of left bronchus or lung: Secondary | ICD-10-CM

## 2016-01-23 DIAGNOSIS — E119 Type 2 diabetes mellitus without complications: Secondary | ICD-10-CM

## 2016-01-23 LAB — COMPREHENSIVE METABOLIC PANEL
ALBUMIN: 2.6 g/dL — AB (ref 3.5–5.0)
ALK PHOS: 91 U/L (ref 40–150)
ALT: 12 U/L (ref 0–55)
AST: 12 U/L (ref 5–34)
Anion Gap: 9 mEq/L (ref 3–11)
BUN: 9.2 mg/dL (ref 7.0–26.0)
CHLORIDE: 103 meq/L (ref 98–109)
CO2: 25 mEq/L (ref 22–29)
Calcium: 8.9 mg/dL (ref 8.4–10.4)
Creatinine: 0.7 mg/dL (ref 0.6–1.1)
GLUCOSE: 249 mg/dL — AB (ref 70–140)
POTASSIUM: 4.7 meq/L (ref 3.5–5.1)
SODIUM: 137 meq/L (ref 136–145)
Total Bilirubin: 0.37 mg/dL (ref 0.20–1.20)
Total Protein: 6.7 g/dL (ref 6.4–8.3)

## 2016-01-23 LAB — CBC & DIFF AND RETIC
BASO%: 0 % (ref 0.0–2.0)
Basophils Absolute: 0 10*3/uL (ref 0.0–0.1)
EOS ABS: 0.1 10*3/uL (ref 0.0–0.5)
EOS%: 1.7 % (ref 0.0–7.0)
HCT: 30.9 % — ABNORMAL LOW (ref 34.8–46.6)
HGB: 9.9 g/dL — ABNORMAL LOW (ref 11.6–15.9)
Immature Retic Fract: 2.1 % (ref 1.60–10.00)
LYMPH%: 7.1 % — AB (ref 14.0–49.7)
MCH: 26.2 pg (ref 25.1–34.0)
MCHC: 32 g/dL (ref 31.5–36.0)
MCV: 81.7 fL (ref 79.5–101.0)
MONO#: 0.3 10*3/uL (ref 0.1–0.9)
MONO%: 6.1 % (ref 0.0–14.0)
NEUT%: 85.1 % — ABNORMAL HIGH (ref 38.4–76.8)
NEUTROS ABS: 4.1 10*3/uL (ref 1.5–6.5)
Platelets: 223 10*3/uL (ref 145–400)
RBC: 3.78 10*6/uL (ref 3.70–5.45)
RDW: 12.9 % (ref 11.2–14.5)
RETIC %: 0.97 % (ref 0.70–2.10)
Retic Ct Abs: 36.67 10*3/uL (ref 33.70–90.70)
WBC: 4.8 10*3/uL (ref 3.9–10.3)
lymph#: 0.3 10*3/uL — ABNORMAL LOW (ref 0.9–3.3)

## 2016-01-23 MED ORDER — FAMOTIDINE IN NACL 20-0.9 MG/50ML-% IV SOLN
20.0000 mg | Freq: Once | INTRAVENOUS | Status: AC
  Administered 2016-01-23: 20 mg via INTRAVENOUS

## 2016-01-23 MED ORDER — DIPHENHYDRAMINE HCL 50 MG/ML IJ SOLN
50.0000 mg | Freq: Once | INTRAMUSCULAR | Status: AC
  Administered 2016-01-23: 50 mg via INTRAVENOUS

## 2016-01-23 MED ORDER — SODIUM CHLORIDE 0.9 % IV SOLN
300.0000 mg | Freq: Once | INTRAVENOUS | Status: AC
  Administered 2016-01-23: 300 mg via INTRAVENOUS
  Filled 2016-01-23: qty 30

## 2016-01-23 MED ORDER — SODIUM CHLORIDE 0.9% FLUSH
3.0000 mL | INTRAVENOUS | Status: DC | PRN
  Administered 2016-01-23: 3 mL via INTRAVENOUS
  Filled 2016-01-23: qty 10

## 2016-01-23 MED ORDER — FAMOTIDINE IN NACL 20-0.9 MG/50ML-% IV SOLN
INTRAVENOUS | Status: AC
  Filled 2016-01-23: qty 50

## 2016-01-23 MED ORDER — SODIUM CHLORIDE 0.9 % IV SOLN
Freq: Once | INTRAVENOUS | Status: AC
  Administered 2016-01-23: 14:00:00 via INTRAVENOUS

## 2016-01-23 MED ORDER — DIPHENHYDRAMINE HCL 50 MG/ML IJ SOLN
INTRAMUSCULAR | Status: AC
  Filled 2016-01-23: qty 1

## 2016-01-23 MED ORDER — MAGIC MOUTHWASH W/LIDOCAINE: 5.0000 mL | Freq: Four times a day (QID) | ORAL | 1 refills | Status: DC | PRN

## 2016-01-23 MED ORDER — SODIUM CHLORIDE 0.9 % IV SOLN
45.0000 mg/m2 | Freq: Once | INTRAVENOUS | Status: AC
  Administered 2016-01-23: 114 mg via INTRAVENOUS
  Filled 2016-01-23: qty 19

## 2016-01-23 MED ORDER — HEPARIN SOD (PORK) LOCK FLUSH 100 UNIT/ML IV SOLN
250.0000 [IU] | Freq: Once | INTRAVENOUS | Status: AC | PRN
  Administered 2016-01-23: 250 [IU]
  Filled 2016-01-23: qty 5

## 2016-01-23 MED ORDER — PALONOSETRON HCL INJECTION 0.25 MG/5ML
0.2500 mg | Freq: Once | INTRAVENOUS | Status: AC
  Administered 2016-01-23: 0.25 mg via INTRAVENOUS

## 2016-01-23 MED ORDER — SODIUM CHLORIDE 0.9 % IV SOLN
20.0000 mg | Freq: Once | INTRAVENOUS | Status: AC
  Administered 2016-01-23: 20 mg via INTRAVENOUS
  Filled 2016-01-23: qty 2

## 2016-01-23 MED ORDER — PALONOSETRON HCL INJECTION 0.25 MG/5ML
INTRAVENOUS | Status: AC
  Filled 2016-01-23: qty 5

## 2016-01-23 MED ORDER — SODIUM CHLORIDE 0.9 % IJ SOLN
10.0000 mL | INTRAMUSCULAR | Status: DC | PRN
  Administered 2016-01-23: 10 mL via INTRAVENOUS
  Filled 2016-01-23: qty 10

## 2016-01-23 NOTE — Patient Instructions (Signed)
[facility] Discharge Instructions for Patients Receiving Chemotherapy  Today you received the following chemotherapy agents TAxol and Carboplatin. To help prevent nausea and vomiting after your treatment, we encourage you to take your nausea medication as directed.   If you develop nausea and vomiting that is not controlled by your nausea medication, call the clinic.   BELOW ARE SYMPTOMS THAT SHOULD BE REPORTED IMMEDIATELY:  *FEVER GREATER THAN 100.5 F  *CHILLS WITH OR WITHOUT FEVER  NAUSEA AND VOMITING THAT IS NOT CONTROLLED WITH YOUR NAUSEA MEDICATION  *UNUSUAL SHORTNESS OF BREATH  *UNUSUAL BRUISING OR BLEEDING  TENDERNESS IN MOUTH AND THROAT WITH OR WITHOUT PRESENCE OF ULCERS  *URINARY PROBLEMS  *BOWEL PROBLEMS  UNUSUAL RASH Items with * indicate a potential emergency and should be followed up as soon as possible.  Feel free to call the clinic you have any questions or concerns. The clinic phone number is [phone].  Please show the [place] at check-in to the Emergency Department and triage nurse.

## 2016-01-23 NOTE — Progress Notes (Signed)
Counseling intern met with patient during infusion treatment. Patient appeared alert, oriented, and in good spirits. Counselor and patient discussed patient's hobbies and other activities, in addition to her faith, that help keep her spirits up. Patient noted that she has had thoughts about what life after cancer will look like and that for now, she is planning to live life "one day at a time." Patient talked about her family, including her relationship with her mother (who was not present for this session) and her desire for her children to become more independent. Patient indicated she had reported to her physician that she had trouble sleeping for 2 or 3 nights after her last treatment. Counselor looked on while patient scrolled through images on her tablet and discussed decorating ideas, noting that making her home look nice keeps her spirits up. Counselor will follow-up with patient at her next infusion treatment.  Lamount Cohen, Counseling Intern Department for Spiritual Care and [facility] Supervisor - [address] [place], [place]

## 2016-01-26 ENCOUNTER — Ambulatory Visit
Admission: RE | Admit: 2016-01-26 | Discharge: 2016-01-26 | Disposition: A | Discharge status: Home / Self Care | Payer: BC Managed Care – PPO | Source: Ambulatory Visit | Attending: Radiation Oncology | Admitting: Radiation Oncology

## 2016-01-26 ENCOUNTER — Ambulatory Visit: Payer: BC Managed Care – PPO

## 2016-01-26 DIAGNOSIS — Z510 Encounter for antineoplastic radiation therapy: Secondary | ICD-10-CM | POA: Diagnosis not present

## 2016-01-26 NOTE — Progress Notes (Signed)
Daily Progress Note   Patient Name: Marilyn [name]       Date: 01/26/2016 DOB: 1965/12/13  Age: 50 y.o. MRN#: [id number] Attending Physician: Tyler Pita, MD Primary Care Physician: Philis Fendt, MD Admit Date: 01/26/2016  Reason for Consultation/Follow-up: Establishing goals of care and Psychosocial/spiritual support  Subjective:  -meet today with Marilyn [name], her mother and two daughters   Several grand-children were present.  It became evident quickly that this family was under great stress directly related to meeting basic needs; food, utilities and transportation, ability to pay taxes  -it was inappropriate/untimely for me to address issues of advanced directives, goals of care  - I engaged Polo Riley  LCSW  into the conversation to address social issues.   We discussed local food banks, and financial advisor, social services  -Patient to f/u with me next Monday morning at 0900.  Will attempt to expand conversation  Length of Stay: 0  Current Medications: Scheduled Meds:    Continuous Infusions:   PRN Meds:   Physical Exam  Constitutional: She appears well-developed.  - appears tired  HENT:  Mouth/Throat: Abnormal dentition. Dental caries present.  Cardiovascular: Normal rate, regular rhythm and normal heart sounds.   Neurological: She is alert.  Skin: Skin is warm and dry.            Vital Signs: LMP 12/19/2015  SpO2:   O2 Device:   O2 Flow Rate:    Intake/output summary: No intake or output data in the 24 hours ending 01/26/16 1445 LBM:   Baseline Weight:   Most recent weight:         Palliative Assessment/Data: 90%      Patient Active Problem List   Diagnosis Date Noted  . Port catheter in place 01/19/2016  . Dehydration 01/14/2016  .  Tachycardia 01/14/2016  . Adenocarcinoma of left lung, stage 3 ([place]) 01/01/2016  . Non-small cell cancer of right lung ([place]) 12/31/2015  . Mediastinal mass   . Chest pain 12/26/2015  . Headache 12/26/2015  . Diabetes mellitus ([place]) 12/26/2015    Palliative Care Assessment & Plan    Code Status:  Code Status History    Date Active Date Inactive Code Status Order ID Comments User Context   12/26/2015  8:06 AM 12/30/2015  8:53 PM Full Code [id number]  Albertine Patricia, MD Inpatient        Thank you for allowing the Palliative Medicine Team to assist in the care of this patient.   Time In: 0900 Time Out: 1015 Total Time 75 min Prolonged Time Billed  no       Greater than 50%  of this time was spent counseling and coordinating care related to the above assessment and plan.  Wadie Lessen, NP  Please contact Palliative Medicine Team phone at [phone] for questions and concerns.

## 2016-01-27 ENCOUNTER — Ambulatory Visit
Admission: RE | Admit: 2016-01-27 | Discharge: 2016-01-27 | Disposition: A | Discharge status: Home / Self Care | Payer: BC Managed Care – PPO | Source: Ambulatory Visit | Attending: Radiation Oncology | Admitting: Radiation Oncology

## 2016-01-27 DIAGNOSIS — Z510 Encounter for antineoplastic radiation therapy: Secondary | ICD-10-CM | POA: Diagnosis not present

## 2016-01-27 NOTE — Telephone Encounter (Signed)
Entry error

## 2016-01-28 ENCOUNTER — Ambulatory Visit (HOSPITAL_BASED_OUTPATIENT_CLINIC_OR_DEPARTMENT_OTHER): Payer: BC Managed Care – PPO

## 2016-01-28 ENCOUNTER — Ambulatory Visit
Admission: RE | Admit: 2016-01-28 | Discharge: 2016-01-28 | Disposition: A | Discharge status: Home / Self Care | Payer: BC Managed Care – PPO | Source: Ambulatory Visit | Attending: Radiation Oncology | Admitting: Radiation Oncology

## 2016-01-28 DIAGNOSIS — C3491 Malignant neoplasm of unspecified part of right bronchus or lung: Secondary | ICD-10-CM | POA: Diagnosis not present

## 2016-01-28 DIAGNOSIS — Z95828 Presence of other vascular implants and grafts: Secondary | ICD-10-CM

## 2016-01-28 DIAGNOSIS — Z510 Encounter for antineoplastic radiation therapy: Secondary | ICD-10-CM | POA: Diagnosis not present

## 2016-01-28 DIAGNOSIS — Z452 Encounter for adjustment and management of vascular access device: Secondary | ICD-10-CM | POA: Diagnosis not present

## 2016-01-28 MED ORDER — SODIUM CHLORIDE 0.9 % IJ SOLN
10.0000 mL | INTRAMUSCULAR | Status: DC | PRN
  Administered 2016-01-28: 10 mL via INTRAVENOUS
  Filled 2016-01-28: qty 10

## 2016-01-28 MED ORDER — HEPARIN SOD (PORK) LOCK FLUSH 100 UNIT/ML IV SOLN
500.0000 [IU] | Freq: Once | INTRAVENOUS | Status: AC | PRN
  Administered 2016-01-28: 500 [IU] via INTRAVENOUS
  Filled 2016-01-28: qty 5

## 2016-01-29 ENCOUNTER — Ambulatory Visit
Admission: RE | Admit: 2016-01-29 | Discharge: 2016-01-29 | Disposition: A | Discharge status: Home / Self Care | Payer: BC Managed Care – PPO | Source: Ambulatory Visit | Attending: Radiation Oncology | Admitting: Radiation Oncology

## 2016-01-29 DIAGNOSIS — Z510 Encounter for antineoplastic radiation therapy: Secondary | ICD-10-CM | POA: Diagnosis not present

## 2016-01-29 DIAGNOSIS — C3490 Malignant neoplasm of unspecified part of unspecified bronchus or lung: Secondary | ICD-10-CM | POA: Insufficient documentation

## 2016-01-29 DIAGNOSIS — Z515 Encounter for palliative care: Secondary | ICD-10-CM | POA: Insufficient documentation

## 2016-01-30 ENCOUNTER — Other Ambulatory Visit (HOSPITAL_BASED_OUTPATIENT_CLINIC_OR_DEPARTMENT_OTHER): Payer: BC Managed Care – PPO

## 2016-01-30 ENCOUNTER — Encounter: Payer: Self-pay | Admitting: Radiation Oncology

## 2016-01-30 ENCOUNTER — Ambulatory Visit
Admission: RE | Admit: 2016-01-30 | Discharge: 2016-01-30 | Disposition: A | Discharge status: Home / Self Care | Payer: BC Managed Care – PPO | Source: Ambulatory Visit | Attending: Radiation Oncology | Admitting: Radiation Oncology

## 2016-01-30 ENCOUNTER — Ambulatory Visit (HOSPITAL_BASED_OUTPATIENT_CLINIC_OR_DEPARTMENT_OTHER): Payer: BC Managed Care – PPO

## 2016-01-30 VITALS — BP 118/66 | HR 90 | Temp 98.0°F | Resp 18 | Wt 280.8 lb

## 2016-01-30 VITALS — BP 109/82 | HR 84 | Temp 98.2°F | Resp 16

## 2016-01-30 DIAGNOSIS — Z5111 Encounter for antineoplastic chemotherapy: Secondary | ICD-10-CM | POA: Diagnosis not present

## 2016-01-30 DIAGNOSIS — C3491 Malignant neoplasm of unspecified part of right bronchus or lung: Secondary | ICD-10-CM | POA: Diagnosis not present

## 2016-01-30 DIAGNOSIS — C3492 Malignant neoplasm of unspecified part of left bronchus or lung: Secondary | ICD-10-CM

## 2016-01-30 DIAGNOSIS — Z510 Encounter for antineoplastic radiation therapy: Secondary | ICD-10-CM | POA: Diagnosis not present

## 2016-01-30 LAB — COMPREHENSIVE METABOLIC PANEL
ALT: 17 U/L (ref 0–55)
AST: 13 U/L (ref 5–34)
Albumin: 2.7 g/dL — ABNORMAL LOW (ref 3.5–5.0)
Alkaline Phosphatase: 88 U/L (ref 40–150)
Anion Gap: 7 mEq/L (ref 3–11)
BUN: 6.7 mg/dL — AB (ref 7.0–26.0)
CHLORIDE: 105 meq/L (ref 98–109)
CO2: 27 meq/L (ref 22–29)
CREATININE: 0.7 mg/dL (ref 0.6–1.1)
Calcium: 9.1 mg/dL (ref 8.4–10.4)
EGFR: >90 mL/min/{1.73_m2} (ref >90)
GLUCOSE: 175 mg/dL — AB (ref 70–140)
Potassium: 4.2 mEq/L (ref 3.5–5.1)
Sodium: 138 mEq/L (ref 136–145)
Total Bilirubin: 0.39 mg/dL (ref 0.20–1.20)
Total Protein: 6.8 g/dL (ref 6.4–8.3)

## 2016-01-30 LAB — CBC WITH DIFFERENTIAL/PLATELET
BASO%: 0 % (ref 0.0–2.0)
Basophils Absolute: 0 10*3/uL (ref 0.0–0.1)
EOS%: 1.1 % (ref 0.0–7.0)
Eosinophils Absolute: 0 10*3/uL (ref 0.0–0.5)
HEMATOCRIT: 30.1 % — AB (ref 34.8–46.6)
HGB: 9.8 g/dL — ABNORMAL LOW (ref 11.6–15.9)
LYMPH#: 0.3 10*3/uL — AB (ref 0.9–3.3)
LYMPH%: 9.1 % — AB (ref 14.0–49.7)
MCH: 26.4 pg (ref 25.1–34.0)
MCHC: 32.6 g/dL (ref 31.5–36.0)
MCV: 81.1 fL (ref 79.5–101.0)
MONO#: 0.2 10*3/uL (ref 0.1–0.9)
MONO%: 6.6 % (ref 0.0–14.0)
NEUT#: 3 10*3/uL (ref 1.5–6.5)
NEUT%: 83.2 % — AB (ref 38.4–76.8)
Platelets: 201 10*3/uL (ref 145–400)
RBC: 3.71 10*6/uL (ref 3.70–5.45)
RDW: 13.3 % (ref 11.2–14.5)
WBC: 3.6 10*3/uL — ABNORMAL LOW (ref 3.9–10.3)

## 2016-01-30 LAB — LACTATE DEHYDROGENASE: LDH: 257 U/L — AB (ref 125–245)

## 2016-01-30 MED ORDER — FAMOTIDINE IN NACL 20-0.9 MG/50ML-% IV SOLN
20.0000 mg | Freq: Once | INTRAVENOUS | Status: AC
  Administered 2016-01-30: 20 mg via INTRAVENOUS

## 2016-01-30 MED ORDER — SODIUM CHLORIDE 0.9% FLUSH
10.0000 mL | INTRAVENOUS | Status: DC | PRN
  Administered 2016-01-30: 10 mL
  Filled 2016-01-30: qty 10

## 2016-01-30 MED ORDER — PACLITAXEL CHEMO INJECTION 300 MG/50ML
45.0000 mg/m2 | Freq: Once | INTRAVENOUS | Status: DC
  Filled 2016-01-30: qty 19

## 2016-01-30 MED ORDER — DIPHENHYDRAMINE HCL 50 MG/ML IJ SOLN
50.0000 mg | Freq: Once | INTRAMUSCULAR | Status: AC
  Administered 2016-01-30: 50 mg via INTRAVENOUS

## 2016-01-30 MED ORDER — PALONOSETRON HCL INJECTION 0.25 MG/5ML
0.2500 mg | Freq: Once | INTRAVENOUS | Status: AC
  Administered 2016-01-30: 0.25 mg via INTRAVENOUS

## 2016-01-30 MED ORDER — SODIUM CHLORIDE 0.9 % IV SOLN
300.0000 mg | Freq: Once | INTRAVENOUS | Status: AC
  Administered 2016-01-30: 300 mg via INTRAVENOUS
  Filled 2016-01-30: qty 30

## 2016-01-30 MED ORDER — DIPHENHYDRAMINE HCL 50 MG/ML IJ SOLN
INTRAMUSCULAR | Status: AC
  Filled 2016-01-30: qty 1

## 2016-01-30 MED ORDER — HEPARIN SOD (PORK) LOCK FLUSH 100 UNIT/ML IV SOLN
250.0000 [IU] | Freq: Once | INTRAVENOUS | Status: AC | PRN
  Administered 2016-01-30: 250 [IU]
  Filled 2016-01-30: qty 5

## 2016-01-30 MED ORDER — PACLITAXEL CHEMO INJECTION 300 MG/50ML
45.0000 mg/m2 | Freq: Once | INTRAVENOUS | Status: AC
  Administered 2016-01-30: 114 mg via INTRAVENOUS
  Filled 2016-01-30: qty 19

## 2016-01-30 MED ORDER — FAMOTIDINE IN NACL 20-0.9 MG/50ML-% IV SOLN
INTRAVENOUS | Status: AC
  Filled 2016-01-30: qty 50

## 2016-01-30 MED ORDER — DEXAMETHASONE SODIUM PHOSPHATE 100 MG/10ML IJ SOLN
20.0000 mg | Freq: Once | INTRAMUSCULAR | Status: AC
  Administered 2016-01-30: 20 mg via INTRAVENOUS
  Filled 2016-01-30: qty 2

## 2016-01-30 MED ORDER — PALONOSETRON HCL INJECTION 0.25 MG/5ML
INTRAVENOUS | Status: AC
  Filled 2016-01-30: qty 5

## 2016-01-30 NOTE — Progress Notes (Signed)
Weight and vitals stable. Patient reports pain is less. Reports pain is only mild, 2 on a scale of 0-10, in the center of her chest instead of her right chest and right shoulder. No skin changes noted within treatment field. Reports using radiaplex as directed. Reports pain associated with swallowing but, relieved by Hycet, magic mouthwash, and carafate. Reports an occasional dry cough. Reports some days she is SOB but, other she isn't. Reports fatigue.   BP 118/66 (BP Location: Left Arm, Patient Position: Sitting, Cuff Size: Large)   Pulse 90   Temp 98 F (36.7 C) (Oral)   Resp 18   Wt 280 lb 12.8 oz (127.4 kg)   LMP 12/19/2015   SpO2 100%   BMI 41.47 kg/m  Wt Readings from Last 3 Encounters:  01/30/16 280 lb 12.8 oz (127.4 kg)  01/23/16 281 lb 3.2 oz (127.6 kg)  01/22/16 283 lb 12.8 oz (128.7 kg)

## 2016-01-30 NOTE — Patient Instructions (Signed)
[facility] Discharge Instructions for Patients Receiving Chemotherapy  Today you received the following chemotherapy agents Taxol and Carboplatin  To help prevent nausea and vomiting after your treatment, we encourage you to take your nausea medication as directed. No Zofran for 3 days. Take Compazine instead.   If you develop nausea and vomiting that is not controlled by your nausea medication, call the clinic.   BELOW ARE SYMPTOMS THAT SHOULD BE REPORTED IMMEDIATELY:  *FEVER GREATER THAN 100.5 F  *CHILLS WITH OR WITHOUT FEVER  NAUSEA AND VOMITING THAT IS NOT CONTROLLED WITH YOUR NAUSEA MEDICATION  *UNUSUAL SHORTNESS OF BREATH  *UNUSUAL BRUISING OR BLEEDING  TENDERNESS IN MOUTH AND THROAT WITH OR WITHOUT PRESENCE OF ULCERS  *URINARY PROBLEMS  *BOWEL PROBLEMS  UNUSUAL RASH Items with * indicate a potential emergency and should be followed up as soon as possible.  Feel free to call the clinic you have any questions or concerns. The clinic phone number is [phone].  Please show the CHEMO ALERT CARD at check-in to the Emergency Department and triage nurse.   

## 2016-01-30 NOTE — Progress Notes (Signed)
  Radiation Oncology         [phone]   Name: Marilyn [name] MRN: [id number]   Date: 01/30/2016  DOB: March 24, 1966   Weekly Radiation Therapy Management    ICD-9-CM ICD-10-CM   1.       Non-small cell cancer of right lung (HCC) 162.9 C34.91      Current Dose: 40 Gy  Planned Dose:  44 Gy  Narrative The patient presents for routine under treatment assessment.  Weight and vitals stable. Patient reports pain is less, and mild, only 2/10 in severity, in the center of her chest instead of her right chest and right shoulder. Per nursing, no skin changes noted in the treatment field. She reports using radiaplex as directed. The patient reports pain associated with swallowing, but it is relieved by Hycet, magic mouthwash, and Carafate. She also reports a dry cough. Reports some days she is SOB but other days she isn't. She reports fatigue.  Set-up films were reviewed. The chart was checked.  Physical Findings  weight is 280 lb 12.8 oz (127.4 kg). Her oral temperature is 98 F (36.7 C). Her blood pressure is 118/66 and her pulse is 90. Her respiration is 18 and oxygen saturation is 100%. . Weight essentially stable.  No significant changes.  Impression The patient is tolerating radiation.  Plan Continue treatment as planned.         Sheral Apley Tammi Klippel, M.D.  This document serves as a record of services personally performed by Tyler Pita, MD. It was created on his behalf by Maryla Morrow, a trained medical scribe. The creation of this record is based on the scribe's personal observations and the provider's statements to them. This document has been checked and approved by the attending provider.

## 2016-01-30 NOTE — Progress Notes (Signed)
Counseling intern visited with patient during her infusion treatment. Patient was alert and oriented and welcomed counselor's visit. During the session, patient reported having been informed that the cancer is "not gong away" and that continued treatment will serve primarily as comfort care. Patient expressed her disappointment at hearing this news and reaffirmed her faith in God's plan for her life. When asked about what will need to happen for her to live comfortably and in ways that will promote her health, patient reiterated her desire for her children to live on their own. She stated that they are not as helpful as she would like them to be, and they appear unwilling or not ready to accept and discuss her declining health. Patient stated that her mother will be a continued source of support.   Lamount Cohen, Counseling Intern Department for Spiritual Care and [facility] Supervisor - [address] [place], [place]

## 2016-02-02 ENCOUNTER — Ambulatory Visit: Admission: RE | Admit: 2016-02-02 | Payer: BC Managed Care – PPO | Source: Ambulatory Visit

## 2016-02-02 ENCOUNTER — Ambulatory Visit
Admission: RE | Admit: 2016-02-02 | Discharge: 2016-02-02 | Disposition: A | Discharge status: Home / Self Care | Payer: BC Managed Care – PPO | Source: Ambulatory Visit | Attending: Radiation Oncology | Admitting: Radiation Oncology

## 2016-02-02 ENCOUNTER — Telehealth: Payer: Self-pay | Admitting: Hematology

## 2016-02-02 ENCOUNTER — Ambulatory Visit (HOSPITAL_BASED_OUTPATIENT_CLINIC_OR_DEPARTMENT_OTHER): Payer: BC Managed Care – PPO

## 2016-02-02 DIAGNOSIS — C3491 Malignant neoplasm of unspecified part of right bronchus or lung: Secondary | ICD-10-CM

## 2016-02-02 DIAGNOSIS — Z510 Encounter for antineoplastic radiation therapy: Secondary | ICD-10-CM | POA: Diagnosis not present

## 2016-02-02 DIAGNOSIS — Z452 Encounter for adjustment and management of vascular access device: Secondary | ICD-10-CM

## 2016-02-02 DIAGNOSIS — Z95828 Presence of other vascular implants and grafts: Secondary | ICD-10-CM

## 2016-02-02 MED ORDER — HEPARIN SOD (PORK) LOCK FLUSH 100 UNIT/ML IV SOLN
500.0000 [IU] | Freq: Once | INTRAVENOUS | Status: AC | PRN
  Administered 2016-02-02: 500 [IU] via INTRAVENOUS
  Filled 2016-02-02: qty 5

## 2016-02-02 MED ORDER — SODIUM CHLORIDE 0.9 % IJ SOLN
10.0000 mL | INTRAMUSCULAR | Status: DC | PRN
  Administered 2016-02-02: 10 mL via INTRAVENOUS
  Filled 2016-02-02: qty 10

## 2016-02-02 NOTE — Patient Instructions (Signed)
PICC Home Guide A peripherally inserted central catheter (PICC) is a long, thin, flexible tube that is inserted into a vein in the upper arm. It is a form of intravenous (IV) access. It is considered to be a "central" line because the tip of the PICC ends in a large vein in your chest. This large vein is called the superior vena cava (SVC). The PICC tip ends in the SVC because there is a lot of blood flow in the SVC. This allows medicines and IV fluids to be quickly distributed throughout the body. The PICC is inserted using a sterile technique by a specially trained nurse or physician. After the PICC is inserted, a chest X-ray exam is done to be sure it is in the correct place.  A PICC may be placed for different reasons, such as:  To give medicines and liquid nutrition that can only be given through a central line. Examples are:  Certain antibiotic treatments.  Chemotherapy.  Total parenteral nutrition (TPN).  To take frequent blood samples.  To give IV fluids and blood products.  If there is difficulty placing a peripheral intravenous (PIV) catheter. If taken care of properly, a PICC can remain in place for several months. A PICC can also allow a person to go home from the hospital early. Medicine and PICC care can be managed at home by a family member or home health care team. WHAT PROBLEMS CAN HAPPEN WHEN I HAVE A PICC? Problems with a PICC can occasionally occur. These may include the following:  A blood clot (thrombus) forming in or at the tip of the PICC. This can cause the PICC to become clogged. A clot-dissolving medicine called tissue plasminogen activator (tPA) can be given through the PICC to help break up the clot.  Inflammation of the vein (phlebitis) in which the PICC is placed. Signs of inflammation may include redness, pain at the insertion site, red streaks, or being able to feel a "cord" in the vein where the PICC is located.  Infection in the PICC or at the insertion  site. Signs of infection may include fever, chills, redness, swelling, or pus drainage from the PICC insertion site.  PICC movement (malposition). The PICC tip may move from its original position due to excessive physical activity, forceful coughing, sneezing, or vomiting.  A break or cut in the PICC. It is important to not use scissors near the PICC.  Nerve or tendon irritation or injury during PICC insertion. WHAT SHOULD I KEEP IN MIND ABOUT ACTIVITIES WHEN I HAVE A PICC?  You may bend your arm and move it freely. If your PICC is near or at the bend of your elbow, avoid activity with repeated motion at the elbow.  Rest at home for the remainder of the day following PICC line insertion.  Avoid lifting heavy objects as instructed by your health care provider.  Avoid using a crutch with the arm on the same side as your PICC. You may need to use a walker. WHAT SHOULD I KNOW ABOUT MY PICC DRESSING?  Keep your PICC bandage (dressing) clean and dry to prevent infection.  Ask your health care provider when you may shower. Ask your health care provider to teach you how to wrap the PICC when you do take a shower.  Change the PICC dressing as instructed by your health care provider.  Change your PICC dressing if it becomes loose or wet. WHAT SHOULD I KNOW ABOUT PICC CARE?  Check the PICC insertion site   daily for leakage, redness, swelling, or pain.  Do not take a bath, swim, or use hot tubs when you have a PICC. Cover PICC line with clear plastic wrap and tape to keep it dry while showering.  Flush the PICC as directed by your health care provider. Let your health care provider know right away if the PICC is difficult to flush or does not flush. Do not use force to flush the PICC.  Do not use a syringe that is less than 10 mL to flush the PICC.  Never pull or tug on the PICC.  Avoid blood pressure checks on the arm with the PICC.  Keep your PICC identification card with you at all  times.  Do not take the PICC out yourself. Only a trained clinical professional should remove the PICC. SEEK IMMEDIATE MEDICAL CARE IF:  Your PICC is accidentally pulled all the way out. If this happens, cover the insertion site with a bandage or gauze dressing. Do not throw the PICC away. Your health care provider will need to inspect it.  Your PICC was tugged or pulled and has partially come out. Do not  push the PICC back in.  There is any type of drainage, redness, or swelling where the PICC enters the skin.  You cannot flush the PICC, it is difficult to flush, or the PICC leaks around the insertion site when it is flushed.  You hear a "flushing" sound when the PICC is flushed.  You have pain, discomfort, or numbness in your arm, shoulder, or jaw on the same side as the PICC.  You feel your heart "racing" or skipping beats.  You notice a hole or tear in the PICC.  You develop chills or a fever. MAKE SURE YOU:   Understand these instructions.  Will watch your condition.  Will get help right away if you are not doing well or get worse.   This information is not intended to replace advice given to you by your health care provider. Make sure you discuss any questions you have with your health care provider.   Document Released: 09/26/2002 Document Revised: 04/12/2014 Document Reviewed: 11/27/2012 Elsevier Interactive Patient Education 2016 [employer].  

## 2016-02-02 NOTE — Telephone Encounter (Signed)
Adjusted 11/3 appointments. Spoke with patient.

## 2016-02-02 NOTE — Progress Notes (Signed)
  Radiation Oncology         [phone] ________________________________  Name: Marilyn [name] MRN: [id number]  Date: 02/02/2016  DOB: 22-Aug-1965  SIMULATION AND TREATMENT PLANNING NOTE    ICD-9-CM ICD-10-CM   1. 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  We are repeating her CT to assess for possible tumor regression, in hopes of reducing exposure to uninvolved lung as we continue treatment to higher doses.  ________________________________  Sheral Apley Tammi Klippel, M.D.

## 2016-02-03 ENCOUNTER — Ambulatory Visit
Admission: RE | Admit: 2016-02-03 | Discharge: 2016-02-03 | Disposition: A | Discharge status: Home / Self Care | Payer: BC Managed Care – PPO | Source: Ambulatory Visit | Attending: Radiation Oncology | Admitting: Radiation Oncology

## 2016-02-03 VITALS — BP 114/67 | HR 91 | Resp 18 | Wt 276.8 lb

## 2016-02-03 DIAGNOSIS — C3491 Malignant neoplasm of unspecified part of right bronchus or lung: Secondary | ICD-10-CM

## 2016-02-03 DIAGNOSIS — Z510 Encounter for antineoplastic radiation therapy: Secondary | ICD-10-CM | POA: Diagnosis not present

## 2016-02-03 NOTE — Progress Notes (Signed)
  Radiation Oncology         [phone]   Name: Marilyn [name] MRN: [id number]   Date: 02/03/2016  DOB: [birth year]   Weekly Radiation Therapy Management    ICD-9-CM ICD-10-CM   1. Non-small cell cancer of right lung (HCC) 162.9 C34.91     Current Dose: 44 Gy  Planned Dose:  60 Gy  Narrative The patient presents for routine under treatment assessment. Reports pain is only mild, 2 on a scale of 0-10, in the center of her chest. No skin changes noted within treatment field. Reports using radiaplex as directed. Reports pain associated with swallowing but, relieved by Hycet, magic mouthwash, and carafate. Reports an occasional dry cough. Reports some days she is SOB but, other she isn't. Reports moderate fatigue.  The patient is without complaint. Set-up films were reviewed. The chart was checked.  Physical Findings  weight is 276 lb 12.8 oz (125.6 kg). Her blood pressure is 114/67 and her pulse is 91. Her respiration is 18 and oxygen saturation is 99%. . Weight essentially stable.  No significant changes.  Impression The patient is tolerating radiation.  Plan Continue treatment as planned.  Will complete November 10.         Sheral Apley Tammi Klippel, M.D.

## 2016-02-03 NOTE — Progress Notes (Signed)
Weight and vitals stable. Patient reports pain is less. Reports pain is only mild, 2 on a scale of 0-10, in the center of her chest. No skin changes noted within treatment field. Reports using radiaplex as directed. Reports pain associated with swallowing but, relieved by Hycet, magic mouthwash, and carafate. Reports an occasional dry cough. Reports some days she is SOB but, other she isn't. Reports moderate fatigue.    BP 114/67 (BP Location: Left Arm, Patient Position: Sitting, Cuff Size: Large)   Pulse 91   Resp 18   Wt 276 lb 12.8 oz (125.6 kg)   LMP 12/19/2015   SpO2 99%   BMI 40.88 kg/m  Wt Readings from Last 3 Encounters:  02/03/16 276 lb 12.8 oz (125.6 kg)  01/30/16 280 lb 12.8 oz (127.4 kg)  01/23/16 281 lb 3.2 oz (127.6 kg)

## 2016-02-04 ENCOUNTER — Ambulatory Visit
Admission: RE | Admit: 2016-02-04 | Discharge: 2016-02-04 | Disposition: A | Discharge status: Home / Self Care | Payer: BC Managed Care – PPO | Source: Ambulatory Visit | Attending: Radiation Oncology | Admitting: Radiation Oncology

## 2016-02-04 DIAGNOSIS — Z510 Encounter for antineoplastic radiation therapy: Secondary | ICD-10-CM | POA: Diagnosis not present

## 2016-02-05 ENCOUNTER — Ambulatory Visit
Admission: RE | Admit: 2016-02-05 | Discharge: 2016-02-05 | Disposition: A | Discharge status: Home / Self Care | Payer: BC Managed Care – PPO | Source: Ambulatory Visit | Attending: Radiation Oncology | Admitting: Radiation Oncology

## 2016-02-05 DIAGNOSIS — Z510 Encounter for antineoplastic radiation therapy: Secondary | ICD-10-CM | POA: Diagnosis not present

## 2016-02-06 ENCOUNTER — Ambulatory Visit (HOSPITAL_BASED_OUTPATIENT_CLINIC_OR_DEPARTMENT_OTHER): Payer: BC Managed Care – PPO

## 2016-02-06 ENCOUNTER — Encounter: Payer: Self-pay | Admitting: *Deleted

## 2016-02-06 ENCOUNTER — Other Ambulatory Visit: Payer: Self-pay | Admitting: *Deleted

## 2016-02-06 ENCOUNTER — Ambulatory Visit
Admission: RE | Admit: 2016-02-06 | Discharge: 2016-02-06 | Disposition: A | Discharge status: Home / Self Care | Payer: BC Managed Care – PPO | Source: Ambulatory Visit | Attending: Radiation Oncology | Admitting: Radiation Oncology

## 2016-02-06 ENCOUNTER — Ambulatory Visit: Payer: BC Managed Care – PPO | Admitting: Nurse Practitioner

## 2016-02-06 ENCOUNTER — Other Ambulatory Visit (HOSPITAL_BASED_OUTPATIENT_CLINIC_OR_DEPARTMENT_OTHER): Payer: BC Managed Care – PPO

## 2016-02-06 ENCOUNTER — Ambulatory Visit (HOSPITAL_BASED_OUTPATIENT_CLINIC_OR_DEPARTMENT_OTHER): Payer: BC Managed Care – PPO | Admitting: Hematology

## 2016-02-06 ENCOUNTER — Encounter: Payer: Self-pay | Admitting: Hematology

## 2016-02-06 VITALS — BP 116/78 | HR 93

## 2016-02-06 VITALS — BP 143/88 | HR 102 | Temp 98.2°F | Resp 20 | Wt 275.6 lb

## 2016-02-06 DIAGNOSIS — K208 Other esophagitis: Secondary | ICD-10-CM

## 2016-02-06 DIAGNOSIS — C3492 Malignant neoplasm of unspecified part of left bronchus or lung: Secondary | ICD-10-CM

## 2016-02-06 DIAGNOSIS — C3491 Malignant neoplasm of unspecified part of right bronchus or lung: Secondary | ICD-10-CM

## 2016-02-06 DIAGNOSIS — E279 Disorder of adrenal gland, unspecified: Secondary | ICD-10-CM | POA: Diagnosis not present

## 2016-02-06 DIAGNOSIS — C770 Secondary and unspecified malignant neoplasm of lymph nodes of head, face and neck: Secondary | ICD-10-CM | POA: Diagnosis not present

## 2016-02-06 DIAGNOSIS — R000 Tachycardia, unspecified: Secondary | ICD-10-CM

## 2016-02-06 DIAGNOSIS — E119 Type 2 diabetes mellitus without complications: Secondary | ICD-10-CM

## 2016-02-06 DIAGNOSIS — G893 Neoplasm related pain (acute) (chronic): Secondary | ICD-10-CM

## 2016-02-06 DIAGNOSIS — R1310 Dysphagia, unspecified: Secondary | ICD-10-CM

## 2016-02-06 DIAGNOSIS — Z510 Encounter for antineoplastic radiation therapy: Secondary | ICD-10-CM | POA: Diagnosis not present

## 2016-02-06 DIAGNOSIS — Z5111 Encounter for antineoplastic chemotherapy: Secondary | ICD-10-CM | POA: Diagnosis not present

## 2016-02-06 DIAGNOSIS — Z95828 Presence of other vascular implants and grafts: Secondary | ICD-10-CM

## 2016-02-06 DIAGNOSIS — I10 Essential (primary) hypertension: Secondary | ICD-10-CM

## 2016-02-06 DIAGNOSIS — Z452 Encounter for adjustment and management of vascular access device: Secondary | ICD-10-CM

## 2016-02-06 DIAGNOSIS — K1233 Oral mucositis (ulcerative) due to radiation: Secondary | ICD-10-CM

## 2016-02-06 LAB — CBC WITH DIFFERENTIAL/PLATELET
BASO%: 0 % (ref 0.0–2.0)
Basophils Absolute: 0 10e3/uL (ref 0.0–0.1)
EOS%: 0.8 % (ref 0.0–7.0)
Eosinophils Absolute: 0 10e3/uL (ref 0.0–0.5)
HCT: 31.7 % — ABNORMAL LOW (ref 34.8–46.6)
HGB: 10.2 g/dL — ABNORMAL LOW (ref 11.6–15.9)
LYMPH%: 8.4 % — ABNORMAL LOW (ref 14.0–49.7)
MCH: 26.3 pg (ref 25.1–34.0)
MCHC: 32.2 g/dL (ref 31.5–36.0)
MCV: 81.7 fL (ref 79.5–101.0)
MONO#: 0.2 10e3/uL (ref 0.1–0.9)
MONO%: 9.2 % (ref 0.0–14.0)
NEUT#: 2 10e3/uL (ref 1.5–6.5)
NEUT%: 81.6 % — ABNORMAL HIGH (ref 38.4–76.8)
Platelets: 169 10e3/uL (ref 145–400)
RBC: 3.88 10e6/uL (ref 3.70–5.45)
RDW: 13.8 % (ref 11.2–14.5)
WBC: 2.4 10e3/uL — ABNORMAL LOW (ref 3.9–10.3)
lymph#: 0.2 10e3/uL — ABNORMAL LOW (ref 0.9–3.3)

## 2016-02-06 LAB — COMPREHENSIVE METABOLIC PANEL
ALBUMIN: 2.6 g/dL — AB (ref 3.5–5.0)
ALK PHOS: 80 U/L (ref 40–150)
ALT: 22 U/L (ref 0–55)
AST: 17 U/L (ref 5–34)
Anion Gap: 8 mEq/L (ref 3–11)
BILIRUBIN TOTAL: 0.53 mg/dL (ref 0.20–1.20)
BUN: 8.1 mg/dL (ref 7.0–26.0)
CALCIUM: 8.7 mg/dL (ref 8.4–10.4)
CO2: 26 mEq/L (ref 22–29)
CREATININE: 0.7 mg/dL (ref 0.6–1.1)
Chloride: 106 mEq/L (ref 98–109)
EGFR: >90 mL/min/{1.73_m2} (ref >90)
GLUCOSE: 147 mg/dL — AB (ref 70–140)
Potassium: 3.9 mEq/L (ref 3.5–5.1)
SODIUM: 140 meq/L (ref 136–145)
TOTAL PROTEIN: 6.5 g/dL (ref 6.4–8.3)

## 2016-02-06 MED ORDER — PALONOSETRON HCL INJECTION 0.25 MG/5ML
0.2500 mg | Freq: Once | INTRAVENOUS | Status: AC
  Administered 2016-02-06: 0.25 mg via INTRAVENOUS

## 2016-02-06 MED ORDER — DIPHENHYDRAMINE HCL 50 MG/ML IJ SOLN
INTRAMUSCULAR | Status: AC
  Filled 2016-02-06: qty 1

## 2016-02-06 MED ORDER — PALONOSETRON HCL INJECTION 0.25 MG/5ML
INTRAVENOUS | Status: AC
  Filled 2016-02-06: qty 5

## 2016-02-06 MED ORDER — FAMOTIDINE IN NACL 20-0.9 MG/50ML-% IV SOLN
20.0000 mg | Freq: Once | INTRAVENOUS | Status: AC
  Administered 2016-02-06: 20 mg via INTRAVENOUS

## 2016-02-06 MED ORDER — SODIUM CHLORIDE 0.9 % IV SOLN: Freq: Once | INTRAVENOUS | Status: DC

## 2016-02-06 MED ORDER — SODIUM CHLORIDE 0.9 % IV SOLN
Freq: Once | INTRAVENOUS | Status: AC
  Administered 2016-02-06: 11:00:00 via INTRAVENOUS

## 2016-02-06 MED ORDER — PACLITAXEL CHEMO INJECTION 300 MG/50ML
45.0000 mg/m2 | Freq: Once | INTRAVENOUS | Status: AC
  Administered 2016-02-06: 114 mg via INTRAVENOUS
  Filled 2016-02-06: qty 19

## 2016-02-06 MED ORDER — DIPHENHYDRAMINE HCL 50 MG/ML IJ SOLN
50.0000 mg | Freq: Once | INTRAMUSCULAR | Status: AC
  Administered 2016-02-06: 50 mg via INTRAVENOUS

## 2016-02-06 MED ORDER — SODIUM CHLORIDE 0.9% FLUSH
10.0000 mL | INTRAVENOUS | Status: DC | PRN
  Administered 2016-02-06: 10 mL
  Filled 2016-02-06: qty 10

## 2016-02-06 MED ORDER — HYDROCODONE-ACETAMINOPHEN 7.5-325 MG/15ML PO SOLN: 15.0000 mL | Freq: Four times a day (QID) | ORAL | 0 refills | Status: DC | PRN

## 2016-02-06 MED ORDER — HEPARIN SOD (PORK) LOCK FLUSH 100 UNIT/ML IV SOLN
250.0000 [IU] | Freq: Once | INTRAVENOUS | Status: AC | PRN
  Administered 2016-02-06: 250 [IU]
  Filled 2016-02-06: qty 5

## 2016-02-06 MED ORDER — FAMOTIDINE IN NACL 20-0.9 MG/50ML-% IV SOLN
INTRAVENOUS | Status: AC
  Filled 2016-02-06: qty 50

## 2016-02-06 MED ORDER — HEPARIN SOD (PORK) LOCK FLUSH 100 UNIT/ML IV SOLN
500.0000 [IU] | Freq: Once | INTRAVENOUS | Status: DC
  Filled 2016-02-06: qty 5

## 2016-02-06 MED ORDER — SODIUM CHLORIDE 0.9 % IV SOLN
20.0000 mg | Freq: Once | INTRAVENOUS | Status: AC
  Administered 2016-02-06: 20 mg via INTRAVENOUS
  Filled 2016-02-06: qty 2

## 2016-02-06 MED ORDER — SODIUM CHLORIDE 0.9% FLUSH
10.0000 mL | INTRAVENOUS | Status: DC | PRN
  Administered 2016-02-06: 10 mL via INTRAVENOUS
  Filled 2016-02-06: qty 10

## 2016-02-06 MED ORDER — SODIUM CHLORIDE 0.9 % IV SOLN
300.0000 mg | Freq: Once | INTRAVENOUS | Status: AC
  Administered 2016-02-06: 300 mg via INTRAVENOUS
  Filled 2016-02-06: qty 30

## 2016-02-06 NOTE — Progress Notes (Signed)
Counseling intern met with patient during infusion treatment. Patient appeared alert and oriented and spoke clearly with counselor throughout session, though her voice was hoarse due to radiation treatment the previous day. Patient stated she was in a good mood but that the week had had its highs and lows. She reported that her daughters were making plans to move out of the home. From previous discussions, patient expressed a desire for her children to be more independent, so this came as encouraging news. She reported having enjoyed a successful event at her church for which she had put in a lot of time and energy.  She recalled counselor asking if she had goteen to know other [facility] patients and described talking to a woman whom she had known many years ago. In a previous session, patient had responded that she usually just exchanges brief greetings with people, so making this connection with an old friend seemed significant.  Patient reported having been in an argument with a family friend that left her feeling angry, though she resolved to have minimal contact with there person and not let the situation upset her further. She also described having almost fallen one day during the week as the result of having expended too much energy.   When asked, patient expressed that she sometimes feels lonely due to not receiving many visitors or phone calls at home and appreciates that her pastor frequently calls to check on her. Counselor and patient discussed how, when she's feeling good, others often forget that she is still very sick and either don't check in or expect her to be her "normal" self.  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 She described herself as giving more than what she expects but also feeling disappointment when others don't give to or support her in the same  way.  When asked what she thinks is her purpose for this phase of her life's journey, patient stated that she thinks it is to tell others about how God has brought her this far.   Patient was excited to report that next Friday will be her last chemotherapy treatment and that she will get to ring the bell.   Lamount Cohen, Counseling Intern Department for Spiritual Care and [facility] Supervisor - Scientist, research (physical sciences)

## 2016-02-06 NOTE — Progress Notes (Signed)
Oncology Nurse Navigator Documentation  Oncology Nurse Navigator Flowsheets 02/06/2016  Navigator Location [facility]  Navigator Encounter Type Treatment/I went to see Marilyn [name] during her treatment.  She was asleep and I did not wake her.    Patient Visit Type MedOnc  Treatment Phase Treatment  Barriers/Navigation Needs Education  Acuity Level 1  Time Spent with Patient 15

## 2016-02-06 NOTE — Patient Instructions (Signed)
[place] Discharge Instructions for Patients Receiving Chemotherapy  Today you received the following chemotherapy agents Taxol and Carboplatin  To help prevent nausea and vomiting after your treatment, we encourage you to take your nausea medication as directed. No Zofran for 3 days. Take Compazine instead.   If you develop nausea and vomiting that is not controlled by your nausea medication, call the clinic.   BELOW ARE SYMPTOMS THAT SHOULD BE REPORTED IMMEDIATELY:  *FEVER GREATER THAN 100.5 F  *CHILLS WITH OR WITHOUT FEVER  NAUSEA AND VOMITING THAT IS NOT CONTROLLED WITH YOUR NAUSEA MEDICATION  *UNUSUAL SHORTNESS OF BREATH  *UNUSUAL BRUISING OR BLEEDING  TENDERNESS IN MOUTH AND THROAT WITH OR WITHOUT PRESENCE OF ULCERS  *URINARY PROBLEMS  *BOWEL PROBLEMS  UNUSUAL RASH Items with * indicate a potential emergency and should be followed up as soon as possible.  Feel free to call the clinic you have any questions or concerns. The clinic phone number is [phone].  Please show the [place] at check-in to the Emergency Department and triage nurse.  Paclitaxel injection What is this medicine? PACLITAXEL (PAK li TAX el) is a chemotherapy drug. It targets fast dividing cells, like cancer cells, and causes these cells to die. This medicine is used to treat ovarian cancer, breast cancer, and other cancers. This medicine may be used for other purposes; ask your health care provider or pharmacist if you have questions. What should I tell my health care provider before I take this medicine? They need to know if you have any of these conditions: -blood disorders -irregular heartbeat -infection (especially a virus infection such as chickenpox, cold sores, or herpes) -liver disease -previous or ongoing radiation therapy -an unusual or allergic reaction to paclitaxel, alcohol, polyoxyethylated castor oil, other chemotherapy agents, other medicines, foods,  dyes, or preservatives -pregnant or trying to get pregnant -breast-feeding How should I use this medicine? This drug is given as an infusion into a vein. It is administered in a hospital or clinic by a specially trained health care professional. Talk to your pediatrician regarding the use of this medicine in children. Special care may be needed. Overdosage: If you think you have taken too much of this medicine contact a poison control center or emergency room at once. NOTE: This medicine is only for you. Do not share this medicine with others. What if I miss a dose? It is important not to miss your dose. Call your doctor or health care professional if you are unable to keep an appointment. What may interact with this medicine? Do not take this medicine with any of the following medications: -disulfiram -metronidazole This medicine may also interact with the following medications: -cyclosporine -diazepam -ketoconazole -medicines to increase blood counts like filgrastim, pegfilgrastim, sargramostim -other chemotherapy drugs like cisplatin, doxorubicin, epirubicin, etoposide, teniposide, vincristine -quinidine -testosterone -vaccines -verapamil Talk to your doctor or health care professional before taking any of these medicines: -acetaminophen -aspirin -ibuprofen -ketoprofen -naproxen This list may not describe all possible interactions. Give your health care provider a list of all the medicines, herbs, non-prescription drugs, or dietary supplements you use. Also tell them if you smoke, drink alcohol, or use illegal drugs. Some items may interact with your medicine. What should I watch for while using this medicine? Your condition will be monitored carefully while you are receiving this medicine. You will need important blood work done while you are taking this medicine. This drug may make you feel generally unwell. This is not uncommon,  as chemotherapy can affect healthy cells as well  as cancer cells. Report any side effects. Continue your course of treatment even though you feel ill unless your doctor tells you to stop. This medicine can cause serious allergic reactions. To reduce your risk you will need to take other medicine(s) before treatment with this medicine. In some cases, you may be given additional medicines to help with side effects. Follow all directions for their use. Call your doctor or health care professional for advice if you get a fever, chills or sore throat, or other symptoms of a cold or flu. Do not treat yourself. This drug decreases your body's ability to fight infections. Try to avoid being around people who are sick. This medicine may increase your risk to bruise or bleed. Call your doctor or health care professional if you notice any unusual bleeding. Be careful brushing and flossing your teeth or using a toothpick because you may get an infection or bleed more easily. If you have any dental work done, tell your dentist you are receiving this medicine. Avoid taking products that contain aspirin, acetaminophen, ibuprofen, naproxen, or ketoprofen unless instructed by your doctor. These medicines may hide a fever. Do not become pregnant while taking this medicine. Women should inform their doctor if they wish to become pregnant or think they might be pregnant. There is a potential for serious side effects to an unborn child. Talk to your health care professional or pharmacist for more information. Do not breast-feed an infant while taking this medicine. Men are advised not to father a child while receiving this medicine. This product may contain alcohol. Ask your pharmacist or healthcare provider if this medicine contains alcohol. Be sure to tell all healthcare providers you are taking this medicine. Certain medicines, like metronidazole and disulfiram, can cause an unpleasant reaction when taken with alcohol. The reaction includes flushing, headache, nausea,  vomiting, sweating, and increased thirst. The reaction can last from 30 minutes to several hours. What side effects may I notice from receiving this medicine? Side effects that you should report to your doctor or health care professional as soon as possible: -allergic reactions like skin rash, itching or hives, swelling of the face, lips, or tongue -low blood counts - This drug may decrease the number of white blood cells, red blood cells and platelets. You may be at increased risk for infections and bleeding. -signs of infection - fever or chills, cough, sore throat, pain or difficulty passing urine -signs of decreased platelets or bleeding - bruising, pinpoint red spots on the skin, black, tarry stools, nosebleeds -signs of decreased red blood cells - unusually weak or tired, fainting spells, lightheadedness -breathing problems -chest pain -high or low blood pressure -mouth sores -nausea and vomiting -pain, swelling, redness or irritation at the injection site -pain, tingling, numbness in the hands or feet -slow or irregular heartbeat -swelling of the ankle, feet, hands Side effects that usually do not require medical attention (report to your doctor or health care professional if they continue or are bothersome): -bone pain -complete hair loss including hair on your head, underarms, pubic hair, eyebrows, and eyelashes -changes in the color of fingernails -diarrhea -loosening of the fingernails -loss of appetite -muscle or joint pain -red flush to skin -sweating This list may not describe all possible side effects. Call your doctor for medical advice about side effects. You may report side effects to FDA at 1-800-FDA-1088. Where should I keep my medicine? This drug is given in a hospital  or clinic and will not be stored at home. NOTE: This sheet is a summary. It may not cover all possible information. If you have questions about this medicine, talk to your doctor, pharmacist, or  health care provider.    2016, Elsevier/Gold Standard. (2014-11-07 13:02:56)  Carboplatin injection What is this medicine? CARBOPLATIN (KAR boe pla tin) is a chemotherapy drug. It targets fast dividing cells, like cancer cells, and causes these cells to die. This medicine is used to treat ovarian cancer and many other cancers. This medicine may be used for other purposes; ask your health care provider or pharmacist if you have questions. What should I tell my health care provider before I take this medicine? They need to know if you have any of these conditions: -blood disorders -hearing problems -kidney disease -recent or ongoing radiation therapy -an unusual or allergic reaction to carboplatin, cisplatin, other chemotherapy, other medicines, foods, dyes, or preservatives -pregnant or trying to get pregnant -breast-feeding How should I use this medicine? This drug is usually given as an infusion into a vein. It is administered in a hospital or clinic by a specially trained health care professional. Talk to your pediatrician regarding the use of this medicine in children. Special care may be needed. Overdosage: If you think you have taken too much of this medicine contact a poison control center or emergency room at once. NOTE: This medicine is only for you. Do not share this medicine with others. What if I miss a dose? It is important not to miss a dose. Call your doctor or health care professional if you are unable to keep an appointment. What may interact with this medicine? -medicines for seizures -medicines to increase blood counts like filgrastim, pegfilgrastim, sargramostim -some antibiotics like amikacin, gentamicin, neomycin, streptomycin, tobramycin -vaccines Talk to your doctor or health care professional before taking any of these medicines: -acetaminophen -aspirin -ibuprofen -ketoprofen -naproxen This list may not describe all possible interactions. Give your health  care provider a list of all the medicines, herbs, non-prescription drugs, or dietary supplements you use. Also tell them if you smoke, drink alcohol, or use illegal drugs. Some items may interact with your medicine. What should I watch for while using this medicine? Your condition will be monitored carefully while you are receiving this medicine. You will need important blood work done while you are taking this medicine. This drug may make you feel generally unwell. This is not uncommon, as chemotherapy can affect healthy cells as well as cancer cells. Report any side effects. Continue your course of treatment even though you feel ill unless your doctor tells you to stop. In some cases, you may be given additional medicines to help with side effects. Follow all directions for their use. Call your doctor or health care professional for advice if you get a fever, chills or sore throat, or other symptoms of a cold or flu. Do not treat yourself. This drug decreases your body's ability to fight infections. Try to avoid being around people who are sick. This medicine may increase your risk to bruise or bleed. Call your doctor or health care professional if you notice any unusual bleeding. Be careful brushing and flossing your teeth or using a toothpick because you may get an infection or bleed more easily. If you have any dental work done, tell your dentist you are receiving this medicine. Avoid taking products that contain aspirin, acetaminophen, ibuprofen, naproxen, or ketoprofen unless instructed by your doctor. These medicines may hide a fever.  Do not become pregnant while taking this medicine. Women should inform their doctor if they wish to become pregnant or think they might be pregnant. There is a potential for serious side effects to an unborn child. Talk to your health care professional or pharmacist for more information. Do not breast-feed an infant while taking this medicine. What side effects may I  notice from receiving this medicine? Side effects that you should report to your doctor or health care professional as soon as possible: -allergic reactions like skin rash, itching or hives, swelling of the face, lips, or tongue -signs of infection - fever or chills, cough, sore throat, pain or difficulty passing urine -signs of decreased platelets or bleeding - bruising, pinpoint red spots on the skin, black, tarry stools, nosebleeds -signs of decreased red blood cells - unusually weak or tired, fainting spells, lightheadedness -breathing problems -changes in hearing -changes in vision -chest pain -high blood pressure -low blood counts - This drug may decrease the number of white blood cells, red blood cells and platelets. You may be at increased risk for infections and bleeding. -nausea and vomiting -pain, swelling, redness or irritation at the injection site -pain, tingling, numbness in the hands or feet -problems with balance, talking, walking -trouble passing urine or change in the amount of urine Side effects that usually do not require medical attention (report to your doctor or health care professional if they continue or are bothersome): -hair loss -loss of appetite -metallic taste in the mouth or changes in taste This list may not describe all possible side effects. Call your doctor for medical advice about side effects. You may report side effects to FDA at 1-800-FDA-1088. Where should I keep my medicine? This drug is given in a hospital or clinic and will not be stored at home. NOTE: This sheet is a summary. It may not cover all possible information. If you have questions about this medicine, talk to your doctor, pharmacist, or health care provider.    2016, Elsevier/Gold Standard. (2007-06-27 14:38:05)

## 2016-02-09 ENCOUNTER — Ambulatory Visit
Admission: RE | Admit: 2016-02-09 | Payer: BC Managed Care – PPO | Source: Ambulatory Visit | Attending: Radiation Oncology | Admitting: Radiation Oncology

## 2016-02-09 ENCOUNTER — Ambulatory Visit
Admission: RE | Admit: 2016-02-09 | Discharge: 2016-02-09 | Disposition: A | Discharge status: Home / Self Care | Payer: BC Managed Care – PPO | Source: Ambulatory Visit | Attending: Radiation Oncology | Admitting: Radiation Oncology

## 2016-02-09 DIAGNOSIS — Z510 Encounter for antineoplastic radiation therapy: Secondary | ICD-10-CM | POA: Diagnosis not present

## 2016-02-10 ENCOUNTER — Encounter: Payer: Self-pay | Admitting: *Deleted

## 2016-02-10 ENCOUNTER — Ambulatory Visit
Admission: RE | Admit: 2016-02-10 | Discharge: 2016-02-10 | Disposition: A | Discharge status: Home / Self Care | Payer: BC Managed Care – PPO | Source: Ambulatory Visit | Attending: Radiation Oncology | Admitting: Radiation Oncology

## 2016-02-10 ENCOUNTER — Telehealth: Payer: Self-pay | Admitting: *Deleted

## 2016-02-10 DIAGNOSIS — Z510 Encounter for antineoplastic radiation therapy: Secondary | ICD-10-CM | POA: Diagnosis not present

## 2016-02-10 NOTE — Progress Notes (Signed)
Oncology Nurse Navigator Documentation  Oncology Nurse Navigator Flowsheets 02/10/2016  Navigator Location [facility]  Navigator Encounter Type Treatment/I received a message regarding Marilyn [name]'s disability paper work.  I spoke with patient after her radiation treatment.  I asked if she has been contacted regarding disability.  She stated no.  Paper are filled out and fax on 01/21/16 went through.  I spoke to [place] to update.  [name] will call insurance about disability.    Patient Visit Type RadOnc  Treatment Phase Treatment  Barriers/Navigation Needs Financial;Coordination of Care  Interventions Coordination of Care;Disability/FMLA  Coordination of Care Other  Acuity Level 2  Acuity Level 2 Other  Time Spent with Patient 30

## 2016-02-10 NOTE — Telephone Encounter (Signed)
Monday 11/6 [name], NP called asking about status of insurance paper work for pt.  Pt stated she turned in paper work to be filled out several weeks ago.  Reviewed information with Tyler Aas, LPN (currently reviewing FMLA/insurance paper work).  Per [name], two sets of paper work have been filled out and faxed. One for [name], and one for [name].  Per [name], she verified with [name] at [name] and [name] at [name] that paper work had been received via fax.  Neither could provide information regarding status at this time.

## 2016-02-11 ENCOUNTER — Ambulatory Visit
Admission: RE | Admit: 2016-02-11 | Discharge: 2016-02-11 | Disposition: A | Discharge status: Home / Self Care | Payer: BC Managed Care – PPO | Source: Ambulatory Visit | Attending: Radiation Oncology | Admitting: Radiation Oncology

## 2016-02-11 ENCOUNTER — Telehealth: Payer: Self-pay | Admitting: Hematology

## 2016-02-11 DIAGNOSIS — Z510 Encounter for antineoplastic radiation therapy: Secondary | ICD-10-CM | POA: Diagnosis not present

## 2016-02-11 NOTE — Telephone Encounter (Signed)
SPOKE WITH PATIENT RE APPOINTMENTS 11/16 FOR LAB/FLUSH/GK. PATIENT WILL GET NEW SCHEDULE 11/10. DATE OF 11/10 LAST IN CARE PLAN - NOT SURE IF PATIENT WILL NEED THE 11/17 TX - PATIENT AWARE GK WILL INFORM ON 11/16.

## 2016-02-12 ENCOUNTER — Ambulatory Visit
Admission: RE | Admit: 2016-02-12 | Discharge: 2016-02-12 | Disposition: A | Discharge status: Home / Self Care | Payer: BC Managed Care – PPO | Source: Ambulatory Visit | Attending: Radiation Oncology | Admitting: Radiation Oncology

## 2016-02-12 VITALS — BP 107/80 | HR 109 | Resp 18 | Wt 275.2 lb

## 2016-02-12 DIAGNOSIS — C3401 Malignant neoplasm of right main bronchus: Secondary | ICD-10-CM | POA: Diagnosis present

## 2016-02-12 DIAGNOSIS — C3491 Malignant neoplasm of unspecified part of right bronchus or lung: Secondary | ICD-10-CM

## 2016-02-12 DIAGNOSIS — Z510 Encounter for antineoplastic radiation therapy: Secondary | ICD-10-CM | POA: Diagnosis not present

## 2016-02-12 MED ORDER — SONAFINE EX EMUL
1.0000 "application " | Freq: Two times a day (BID) | CUTANEOUS | Status: DC
  Administered 2016-02-12: 1 via TOPICAL

## 2016-02-12 NOTE — Progress Notes (Signed)
Weight and vitals stable. Patient denies pain. Hyperpigmentation with two nickle size areas of desquamation noted within treatment field. Reports using sonafine as directed. Encouraged to apply neosporin to areas of desquamation. Packets of neosporin provided to patient today.  Reports pain associated with swallowing continues but, is managed with Hycet, magic mouthwash, and carafate. Reports a persist productive cough with clear sputum. Denies hemoptysis. Reports some days she is SOB. Reports moderate fatigue. Encouraged patient to continue use of Sonafine and neosporin bid for the next two weeks. Provided patient with a one month follow up appointment card. Encouraged patient to call with future needs. Patient verbalized understanding of all reviewed.    BP 107/80 (BP Location: Left Arm, Patient Position: Sitting, Cuff Size: Large)   Pulse (!) 109   Resp 18   Wt 275 lb 3.2 oz (124.8 kg)   SpO2 99%   BMI 40.64 kg/m  Wt Readings from Last 3 Encounters:  02/12/16 275 lb 3.2 oz (124.8 kg)  02/06/16 275 lb 9.6 oz (125 kg)  02/03/16 276 lb 12.8 oz (125.6 kg)

## 2016-02-12 NOTE — Progress Notes (Signed)
  Radiation Oncology         [phone]   Name: Marilyn [name] MRN: [id number]   Date: 02/12/2016  DOB: 1966/02/29     Weekly Radiation Therapy Management    ICD-9-CM ICD-10-CM   1. 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 Non-small cell cancer of right lung (HCC) 162.9 C34.91     Current Dose: 58 Gy  Planned Dose:  60 Gy  Narrative The patient presents for routine under treatment assessment.  Weight and vitals stable. Patient denies pain. Nursing notes, hyperpigmentation with two nickle size areas of desquamation within treatment field. Reports using sonafine as directed. She was encouraged to apply neosporin to areas of desquamation. Packets of neosporin provided to patient today. Reports pain associated with swallowing continues but, is managed with Hycet, magic mouthwash, and carafate. Reports a persistent productive cough with clear sputum. Denies hemoptysis. Reports some days she is SOB. Reports moderate fatigue.   Set-up films were reviewed. The chart was checked.  Physical Findings  weight is 275 lb 3.2 oz (124.8 kg). Her blood pressure is 107/80 and her pulse is 109 (abnormal). Her respiration is 18 and oxygen saturation is 99%. . Weight essentially stable.  In general this is a well appearing African American female in no acute distress. She's alert and oriented x4 and appropriate throughout the examination. Cardiopulmonary assessment is negative for acute distress and she exhibits normal effort. Some desquamation at the base of the right neck and along the midline about 4 to 5 cm above the supraclavicular region.  Impression The patient is tolerating radiation.  Plan Continue treatment as planned.  Will complete treatment tomorrow and return for follow up in 1 month. She is scheduled for follow up with Dr. Irene Limbo on 02/19/16.         Sheral Apley Tammi Klippel, M.D.  This document serves as a record of services personally  performed by Tyler Pita, MD and Shona Simpson, PA-C. It was created on his behalf by Arlyce Harman, a trained medical scribe. The creation of this record is based on the scribe's personal observations and the provider's statements to them. This document has been checked and approved by the attending provider.

## 2016-02-13 ENCOUNTER — Encounter: Payer: Self-pay | Admitting: Radiation Oncology

## 2016-02-13 ENCOUNTER — Encounter: Payer: Self-pay | Admitting: *Deleted

## 2016-02-13 ENCOUNTER — Other Ambulatory Visit (HOSPITAL_BASED_OUTPATIENT_CLINIC_OR_DEPARTMENT_OTHER): Payer: BC Managed Care – PPO

## 2016-02-13 ENCOUNTER — Ambulatory Visit
Admission: RE | Admit: 2016-02-13 | Discharge: 2016-02-13 | Disposition: A | Discharge status: Home / Self Care | Payer: BC Managed Care – PPO | Source: Ambulatory Visit | Attending: Radiation Oncology | Admitting: Radiation Oncology

## 2016-02-13 ENCOUNTER — Ambulatory Visit: Payer: BC Managed Care – PPO

## 2016-02-13 ENCOUNTER — Ambulatory Visit (HOSPITAL_BASED_OUTPATIENT_CLINIC_OR_DEPARTMENT_OTHER): Payer: BC Managed Care – PPO

## 2016-02-13 VITALS — BP 127/96 | HR 144 | Temp 99.0°F | Resp 20

## 2016-02-13 DIAGNOSIS — Z5111 Encounter for antineoplastic chemotherapy: Secondary | ICD-10-CM

## 2016-02-13 DIAGNOSIS — C3492 Malignant neoplasm of unspecified part of left bronchus or lung: Secondary | ICD-10-CM

## 2016-02-13 DIAGNOSIS — C3491 Malignant neoplasm of unspecified part of right bronchus or lung: Secondary | ICD-10-CM

## 2016-02-13 DIAGNOSIS — Z510 Encounter for antineoplastic radiation therapy: Secondary | ICD-10-CM | POA: Diagnosis not present

## 2016-02-13 LAB — COMPREHENSIVE METABOLIC PANEL
ALT: 19 U/L (ref 0–55)
ANION GAP: 9 meq/L (ref 3–11)
AST: 16 U/L (ref 5–34)
Albumin: 2.5 g/dL — ABNORMAL LOW (ref 3.5–5.0)
Alkaline Phosphatase: 76 U/L (ref 40–150)
BILIRUBIN TOTAL: 0.34 mg/dL (ref 0.20–1.20)
BUN: 7.9 mg/dL (ref 7.0–26.0)
CO2: 25 meq/L (ref 22–29)
CREATININE: 0.7 mg/dL (ref 0.6–1.1)
Calcium: 8.7 mg/dL (ref 8.4–10.4)
Chloride: 109 mEq/L (ref 98–109)
EGFR: >90 mL/min/{1.73_m2} (ref >90)
GLUCOSE: 146 mg/dL — AB (ref 70–140)
Potassium: 3.9 mEq/L (ref 3.5–5.1)
SODIUM: 142 meq/L (ref 136–145)
TOTAL PROTEIN: 6.3 g/dL — AB (ref 6.4–8.3)

## 2016-02-13 LAB — CBC WITH DIFFERENTIAL/PLATELET
BASO%: 0 % (ref 0.0–2.0)
Basophils Absolute: 0 10*3/uL (ref 0.0–0.1)
EOS%: 0.9 % (ref 0.0–7.0)
Eosinophils Absolute: 0 10*3/uL (ref 0.0–0.5)
HCT: 31.6 % — ABNORMAL LOW (ref 34.8–46.6)
HGB: 10.3 g/dL — ABNORMAL LOW (ref 11.6–15.9)
LYMPH%: 14.7 % (ref 14.0–49.7)
MCH: 26.6 pg (ref 25.1–34.0)
MCHC: 32.6 g/dL (ref 31.5–36.0)
MCV: 81.7 fL (ref 79.5–101.0)
MONO#: 0.3 10*3/uL (ref 0.1–0.9)
MONO%: 14.7 % — AB (ref 0.0–14.0)
NEUT%: 69.7 % (ref 38.4–76.8)
NEUTROS ABS: 1.5 10*3/uL (ref 1.5–6.5)
PLATELETS: 172 10*3/uL (ref 145–400)
RBC: 3.87 10*6/uL (ref 3.70–5.45)
RDW: 14.4 % (ref 11.2–14.5)
WBC: 2.1 10*3/uL — AB (ref 3.9–10.3)
lymph#: 0.3 10*3/uL — ABNORMAL LOW (ref 0.9–3.3)

## 2016-02-13 MED ORDER — FAMOTIDINE IN NACL 20-0.9 MG/50ML-% IV SOLN
INTRAVENOUS | Status: AC
  Filled 2016-02-13: qty 50

## 2016-02-13 MED ORDER — SODIUM CHLORIDE 0.9 % IV SOLN
20.0000 mg | Freq: Once | INTRAVENOUS | Status: AC
  Administered 2016-02-13: 20 mg via INTRAVENOUS
  Filled 2016-02-13: qty 2

## 2016-02-13 MED ORDER — DIPHENHYDRAMINE HCL 50 MG/ML IJ SOLN
50.0000 mg | Freq: Once | INTRAMUSCULAR | Status: AC
  Administered 2016-02-13: 50 mg via INTRAVENOUS

## 2016-02-13 MED ORDER — PACLITAXEL CHEMO INJECTION 300 MG/50ML
45.0000 mg/m2 | Freq: Once | INTRAVENOUS | Status: AC
  Administered 2016-02-13: 114 mg via INTRAVENOUS
  Filled 2016-02-13: qty 19

## 2016-02-13 MED ORDER — PALONOSETRON HCL INJECTION 0.25 MG/5ML
INTRAVENOUS | Status: AC
  Filled 2016-02-13: qty 5

## 2016-02-13 MED ORDER — SODIUM CHLORIDE 0.9% FLUSH
10.0000 mL | INTRAVENOUS | Status: DC | PRN
  Administered 2016-02-13: 10 mL
  Filled 2016-02-13: qty 10

## 2016-02-13 MED ORDER — PALONOSETRON HCL INJECTION 0.25 MG/5ML
0.2500 mg | Freq: Once | INTRAVENOUS | Status: AC
  Administered 2016-02-13: 0.25 mg via INTRAVENOUS

## 2016-02-13 MED ORDER — HEPARIN SOD (PORK) LOCK FLUSH 100 UNIT/ML IV SOLN
500.0000 [IU] | Freq: Once | INTRAVENOUS | Status: AC | PRN
  Administered 2016-02-13: 250 [IU]
  Filled 2016-02-13: qty 5

## 2016-02-13 MED ORDER — SODIUM CHLORIDE 0.9 % IV SOLN
500.0000 mL | INTRAVENOUS | Status: DC
  Administered 2016-02-13: 500 mL via INTRAVENOUS

## 2016-02-13 MED ORDER — DIPHENHYDRAMINE HCL 50 MG/ML IJ SOLN
INTRAMUSCULAR | Status: AC
  Filled 2016-02-13: qty 1

## 2016-02-13 MED ORDER — SODIUM CHLORIDE 0.9 % IV SOLN
300.0000 mg | Freq: Once | INTRAVENOUS | Status: AC
  Administered 2016-02-13: 300 mg via INTRAVENOUS
  Filled 2016-02-13: qty 30

## 2016-02-13 MED ORDER — FAMOTIDINE IN NACL 20-0.9 MG/50ML-% IV SOLN
20.0000 mg | Freq: Once | INTRAVENOUS | Status: AC
  Administered 2016-02-13: 20 mg via INTRAVENOUS

## 2016-02-13 NOTE — Progress Notes (Signed)
Ok to treat with HR 144 per Delle Reining, per MD [facility]

## 2016-02-13 NOTE — Progress Notes (Signed)
Oncology Nurse Navigator Documentation  Oncology Nurse Navigator Flowsheets 02/13/2016  Navigator Location [facility]  Navigator Encounter Type Treatment/I went to speak with Ms. Cocco to follow up on disability.  She was asleep in the chair. I did not disturb.   Patient Visit Type MedOnc  Treatment Phase Treatment  Acuity Level 1  Acuity Level 1 Minimal follow up required  Time Spent with Patient 15

## 2016-02-13 NOTE — Patient Instructions (Signed)
[facility] Discharge Instructions for Patients Receiving Chemotherapy  Today you received the following chemotherapy agents Taxol and Carboplatin  To help prevent nausea and vomiting after your treatment, we encourage you to take your nausea medication as directed. No Zofran for 3 days. Take Compazine instead.   If you develop nausea and vomiting that is not controlled by your nausea medication, call the clinic.   BELOW ARE SYMPTOMS THAT SHOULD BE REPORTED IMMEDIATELY:  *FEVER GREATER THAN 100.5 F  *CHILLS WITH OR WITHOUT FEVER  NAUSEA AND VOMITING THAT IS NOT CONTROLLED WITH YOUR NAUSEA MEDICATION  *UNUSUAL SHORTNESS OF BREATH  *UNUSUAL BRUISING OR BLEEDING  TENDERNESS IN MOUTH AND THROAT WITH OR WITHOUT PRESENCE OF ULCERS  *URINARY PROBLEMS  *BOWEL PROBLEMS  UNUSUAL RASH Items with * indicate a potential emergency and should be followed up as soon as possible.  Feel free to call the clinic you have any questions or concerns. The clinic phone number is [phone].  Please show the CHEMO ALERT CARD at check-in to the Emergency Department and triage nurse.   

## 2016-02-13 NOTE — Progress Notes (Signed)
Counseling intern visited with patient during her final chemotherapy treatment. Patient was asleep when intern arrived but easily roused at hearing her name and engaged politely and coherently with intern. Patient indicated she was happy to be wrapping up the treatment. Patient reported that her home life will be quieter and calmer due to one of the daughters moving out.  Counselor provided patient with contact information and encouraged her to call if she ever needs emotional support.  Lamount Cohen, Counseling Intern Department for [facility] and [facility] [address] [place], [place]

## 2016-02-14 ENCOUNTER — Ambulatory Visit: Payer: BC Managed Care – PPO

## 2016-02-16 ENCOUNTER — Ambulatory Visit: Payer: BC Managed Care – PPO

## 2016-02-16 NOTE — Progress Notes (Signed)
[name]    HEMATOLOGY/ONCOLOGY CLINIC NOTE  Date of Service: .02/06/2016  Patient Care Team: Nolene Ebbs, MD as PCP - General (Internal Medicine)  CHIEF COMPLAINTS/PURPOSE OF CONSULTATION:  Newly diagnosed Lung Adenocarcinoma.  Diagnosis Stage IIIB Lung Adenocarcinoma  Treatment -Concurrent Chemo-radiation with Carboplatin/Taxol. Completing RT on 02/13/2016  HISTORY OF PRESENTING ILLNESS: Plz see my previous note for details on initial presentation.  INTERVAL HISTORY  Patient is here for her scheduled f/u for chemotherapy. 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 Notes that the carafate and magic mouthwash are help with the odynophagia and she is trying to continue eating ok. Added additional IVF with chemotherapy.   MEDICAL HISTORY:  Past Medical History:  Diagnosis Date  . Asthma   . Diabetes mellitus   . Hypercholesteremia   . Hypertension   . Lung cancer ([place])   . Neuropathy ([place])   . Obese     SURGICAL HISTORY: Past Surgical History:  Procedure Laterality Date  . CESAREAN SECTION    . IR GENERIC HISTORICAL  01/08/2016   IR US GUIDE VASC ACCESS RIGHT 01/08/2016 [name], DO [facility]  . IR GENERIC HISTORICAL  01/08/2016   IR FLUORO GUIDE CV LINE RIGHT 01/08/2016 [name], DO [facility]  . TUBAL LIGATION    . US guided core needle biopsy     of right lower neck/supraclavicular lymph nodes    SOCIAL HISTORY: Social History   Social History  . Marital status: Divorced    Spouse name: N/A  . Number of children: N/A  . Years of education: N/A   Occupational History  . Not on file.   Social History Main Topics  . Smoking status: Never Smoker  . Smokeless tobacco: Never Used  . Alcohol use No  . Drug use: No  . Sexual activity: Not Currently   Other Topics Concern  . Not on file   Social History Narrative   No bird or mold exposure. No recent travel.    FAMILY HISTORY: Family History  Problem Relation Age of  Onset  . Cancer Neg Hx   . Rheumatologic disease Neg Hx     ALLERGIES:  has No Known Allergies.  MEDICATIONS:  Current Outpatient Prescriptions  Medication Sig Dispense Refill  . aspirin EC 81 MG tablet Take 1 tablet (81 mg total) by mouth daily. 60 tablet 2  . chlorhexidine (PERIDEX) 0.12 % solution Use as directed 15 mLs in the mouth or throat 2 (two) times daily. 120 mL 0  . dexamethasone (DECADRON) 4 MG tablet Take 2 tablets (8 mg total) by mouth daily. Start the day after chemotherapy for 2 days. 30 tablet 1  . Fluocinonide 0.1 % CREA     . insulin aspart (NOVOLOG) 100 UNIT/ML FlexPen As per sliding scale instructions 15 mL 2  . Insulin Degludec (TRESIBA FLEXTOUCH) 200 UNIT/ML SOPN Inject 100 Units into the skin daily before breakfast.     . lidocaine-prilocaine (EMLA) cream Apply to affected area once 30 g 3  . magic mouthwash w/lidocaine SOLN Take 5 mLs by mouth 4 (four) times daily as needed for mouth pain. 400 mL 1  . metoprolol succinate (TOPROL-XL) 25 MG 24 hr tablet Take 1 tablet (25 mg total) by mouth daily. 30 tablet 0  . metoprolol tartrate (LOPRESSOR) 25 MG tablet     . ondansetron (ZOFRAN) 8 MG tablet Take 1 tablet (8 mg total) by mouth 2 (two) times daily as needed for refractory nausea / vomiting.  Start on day 3 after chemo. 30 tablet 1  . prochlorperazine (COMPAZINE) 10 MG tablet Take 1 tablet (10 mg total) by mouth every 6 (six) hours as needed (Nausea or vomiting). 30 tablet 1  . sucralfate (CARAFATE) 1 g tablet Take 1 tablet (1 g total) by mouth 4 (four) times daily -  with meals and at bedtime. 5 min before meals for radiation induced esophagitis 120 tablet 2  . Wound Dressings (SONAFINE) Apply 1 application topically 2 (two) times daily.    Noelle Penner ALLERGY RELIEF CHILDRENS 12.5 MG/5ML liquid     . HYDROcodone-acetaminophen (HYCET) 7.5-325 mg/15 ml solution Take 15 mLs by mouth 4 (four) times daily as needed for moderate pain. 473 mL 0   Current Facility-Administered  Medications  Medication Dose Route Frequency Provider Last Rate Last Dose  . 0.9 %  sodium chloride infusion   Intravenous Once [name], MD        REVIEW OF SYSTEMS:    10 Point review of Systems was done is negative except as noted above.  PHYSICAL EXAMINATION: ECOG PERFORMANCE STATUS: 1 - Symptomatic but completely ambulatory  . Vitals:   02/06/16 0946  BP: (!) 143/88  Pulse: (!) 102  Resp: 20  Temp: 98.2 F (36.8 C)   Filed Weights   02/06/16 0946  Weight: 275 lb 9.6 oz (125 kg)   .Body mass index is 40.7 kg/m.  GENERAL:alert, in no acute distress and comfortable SKIN: skin color, texture, turgor are normal, no rashes or significant lesions EYES: normal, conjunctiva are pink and non-injected, sclera clear OROPHARYNX:no exudate, no erythema and lips, buccal mucosa, and tongue normal  NECK: supple, no JVD, thyroid normal size, non-tender, without nodularity LYMPH:  no palpable lymphadenopathy in the cervical, axillary or inguinal.             LUNGS: clear to auscultation with normal respiratory effort. Distant breath sounds  HEART: regular rate & rhythm,  no murmurs and no lower extremity edema ABDOMEN: abdomen obese, soft, non-tender, normoactive bowel sounds  Musculoskeletal: no cyanosis of digits and no clubbing  PSYCH: alert & oriented x 3 with fluent speech NEURO: no focal motor/sensory deficits  LABORATORY DATA:  . CBC Latest Ref Rng & Units 02/06/2016 01/30/2016  WBC 3.9 - 10.3 10e3/uL 2.4(L) 3.6(L)  Hemoglobin 11.6 - 15.9 g/dL 10.2(L) 9.8(L)  Hematocrit 34.8 - 46.6 % 31.7(L) 30.1(L)  Platelets 145 - 400 10e3/uL 169 201   . CMP Latest Ref Rng & Units 02/06/2016 01/30/2016  Glucose 70 - 140 mg/dl 147(H) 175(H)  BUN 7.0 - 26.0 mg/dL 8.1 6.7(L)  Creatinine 0.6 - 1.1 mg/dL 0.7 0.7  Sodium 136 - 145 mEq/L 140 138  Potassium 3.5 - 5.1 mEq/L 3.9 4.2  Chloride 101 - 111 mmol/L - -  CO2 22 - 29 mEq/L 26 27  Calcium 8.4 - 10.4 mg/dL 8.7 9.1  Total  Protein 6.4 - 8.3 g/dL 6.5 6.8  Total Bilirubin 0.20 - 1.20 mg/dL 0.53 0.39  Alkaline Phos 40 - 150 U/L 80 88  AST 5 - 34 U/L 17 13  ALT 0 - 55 U/L 22 17            RADIOGRAPHIC STUDIES: I have personally reviewed the radiological images as listed and agreed with the findings in the report.   IMPRESSION: 1. Hypermetabolic 1.7 cm peripheral right hilar nodule. Given the peripheral location of this nodule within the right hilum, this could potentially represent a centrally located primary bronchogenic malignancy.  2. Hypermetabolic bulky ipsilateral paratracheal, bilateral prevascular mediastinal and bilateral scalene nodal metastases. 3. Hypermetabolic right lower neck nodal metastases involving right neck nodal levels III, IV and V. 4. No hypermetabolic metastatic disease in the abdomen, pelvis or skeleton. 5. Aortic atherosclerosis.   Electronically Signed   By: Ilona Sorrel M.D.   On: 01/12/2016 09:42    Dg Chest 2 View  Result Date: 12/25/2015 CLINICAL DATA:  Generalize chronic chest pain, shortness of breath, and cough. History of asthma, diabetes, and hypertension. EXAM: CHEST  2 VIEW COMPARISON:  09/15/2015 FINDINGS: Right hilar and right peritracheal lymphadenopathy is demonstrated similar to prior study. Lungs are clear and expanded. No focal airspace disease or consolidation. No blunting of costophrenic angles. No pneumothorax. Normal heart size and pulmonary vascularity. Mild degenerative changes in the spine. IMPRESSION: Right hilar and right paratracheal lymphadenopathy is demonstrated, similar to prior study. No evidence of active pulmonary disease. Electronically Signed   By: Lucienne Capers M.D.   On: 12/25/2015 21:23   Ct Soft Tissue Neck W Contrast  Result Date: 12/27/2015 CLINICAL DATA:  Sore throat with right-sided swelling. Mediastinal mass on chest CT. EXAM: CT NECK WITH CONTRAST TECHNIQUE: Multidetector CT imaging of the neck was performed using  the standard protocol following the bolus administration of intravenous contrast. CONTRAST:  167m ISOVUE-300 IOPAMIDOL (ISOVUE-300) INJECTION 61% COMPARISON:  CTA neck 05/30/2015 FINDINGS: Pharynx and larynx: No pharyngeal mass or inflammatory change. Unremarkable larynx. Salivary glands: Submandibular and parotid glands are unremarkable. Thyroid: Unremarkable. Lymph nodes: There are bulky right level IV and supraclavicular lymph nodes which are new from the prior CTA. These measure up to at least 2 cm in short axis, with evaluation mildly limited by beam hardening. Enlarged right level V lymph nodes are also new and measure up to 1.5 cm in short axis. Subcentimeter short axis lymph nodes in the right neck more superiorly and scattered throughout the left neck (including an 8 mm short axis left level IV lymph node) are all similar to the prior CTA. Vascular: Major vascular structures of the neck appear patent. 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 Limited intracranial: Unremarkable. Visualized orbits: Not imaged. Mastoids and visualized paranasal sinuses: Clear. Skeleton: Mild lower cervical spondylosis. No destructive osseous lesion identified. subcentimeter sclerotic focus in the base of the C2 spinous processes unchanged, possibly a bone island. Upper chest: Bulky mediastinal lymphadenopathy, more fully evaluated on yesterday's chest CT. Other: None. IMPRESSION: 1. Unremarkable appearance of the pharynx. No evidence of acute process to explain sore throat. 2. Bulky lymphadenopathy in the right lower neck and supraclavicular regions, consistent with metastatic disease. Electronically Signed   By: ALogan BoresM.D.   On: 12/27/2015 14:08   Ct Angio Chest Pe W Or Wo Contrast  Result Date: 12/26/2015 CLINICAL DATA:  Acute onset of generalized chest pain, tachycardia, high blood pressure and shortness of breath. Initial encounter. EXAM: CT ANGIOGRAPHY CHEST WITH CONTRAST  TECHNIQUE: Multidetector CT imaging of the chest was performed using the standard protocol during bolus administration of intravenous contrast. Multiplanar CT image reconstructions and MIPs were obtained to evaluate the vascular anatomy. CONTRAST:  100 mL of Isovue 370 IV contrast COMPARISON:  Chest radiograph performed 12/25/2015, and CTA of the chest performed 05/30/2015 FINDINGS: Cardiovascular: There is no evidence of pulmonary embolus. There is mild mass effect on vasculature from the patient's mediastinal and right hilar masses. The thoracic aorta remains grossly intact. The great vessels are grossly unremarkable in appearance.  Mediastinum/Nodes: There is a large 7.2 x 6.0 x 4.8 cm mass at the right paratracheal region, markedly enlarged from the prior study, partially encasing the right side of the trachea and the right mainstem bronchus, highly suspicious for small cell lung cancer. There is mild mass effect on the right side of the trachea. Right hilar and azygoesophageal recess nodes measure up to 2.0 cm in short axis, concerning for metastatic disease. No pericardial effusion is identified. Scattered prominent anterior mediastinal nodes are seen, measuring up to 1.7 cm in short axis. Lungs/Pleura: Bibasilar atelectasis is noted. No pleural effusion or pneumothorax is seen. No pulmonary nodules are identified. Upper Abdomen: The visualized portions of the liver and spleen are grossly unremarkable. Musculoskeletal: No acute osseous abnormalities are identified. The visualized musculature is unremarkable in appearance. Review of the MIP images confirms the above findings. IMPRESSION: 1. No evidence of pulmonary embolus. 2. 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 Mild mass-effect on the right side of the trachea. 3. Right  hilar and azygoesophageal recess nodes measure up to 2.0 cm in short axis. Prominent anterior mediastinal nodes measure up to 1.7 cm in short axis. These are concerning for metastatic disease. 4. Bibasilar atelectasis noted. These results were called by telephone at the time of interpretation on 12/26/2015 at 5:37 am to [facility] PA, who verbally acknowledged these results. Electronically Signed   By: Garald Balding M.D.   On: 12/26/2015 05:40   Mr Jeri Cos UJ Contrast  Result Date: 12/26/2015 CLINICAL DATA:  Lung cancer. Headache. Assess for metastatic disease. EXAM: MRI HEAD WITHOUT AND WITH CONTRAST TECHNIQUE: Multiplanar, multiecho pulse sequences of the brain and surrounding structures were obtained without and with intravenous contrast. CONTRAST:  16m MULTIHANCE GADOBENATE DIMEGLUMINE 529 MG/ML IV SOLN COMPARISON:  CT 05/30/2015 FINDINGS: Brain: The brain has normal appearance without evidence of malformation, atrophy, old or acute small or large vessel infarction, hemorrhage, hydrocephalus or extra-axial collection. No pituitary abnormality. After contrast administration, no abnormal enhancement occurs. Vascular: Major vessels at the base of the brain show flow. Skull and upper cervical spine: Normal Sinuses/Orbits: Clear/ normal. Other: None significant. IMPRESSION: Normal examination. No evidence of metastatic disease. No other finding to explain headache. Electronically Signed   By: MNelson ChimesM.D.   On: 12/26/2015 11:27   Ct Abdomen Pelvis W Contrast  Addendum Date: 12/27/2015   ADDENDUM REPORT: 12/27/2015 14:12 ADDENDUM: There is questionable sludge within the gallbladder. It may be prudent to consider ultrasound of the right upper quadrant given questionable sludge in the gallbladder as well as prominence of the common bile duct. Electronically Signed   By: WLowella GripIII M.D.   On: 12/27/2015 14:12   Result Date: 12/27/2015 CLINICAL DATA:  Chest adenopathy with concern for  metastatic focus EXAM: CT ABDOMEN AND PELVIS WITH CONTRAST TECHNIQUE: Multidetector CT imaging of the abdomen and pelvis was performed using the standard protocol following bolus administration of intravenous contrast. Oral contrast was also administered. CONTRAST:  1015mISOVUE-300 IOPAMIDOL (ISOVUE-300) INJECTION 61% COMPARISON:  CT abdomen and pelvis May 18, 2010; chest CT December 26, 2015 FINDINGS: Lower chest: Focal opacity in the posterior aspect of the superior segment right lower lobe is increased from 1 day prior and is consistent with either increased atelectasis or new focus infiltrate in this area. Lung bases  otherwise are clear. Hepatobiliary: No focal liver lesions are evident. There is no appreciable gallbladder wall thickening. There is prominence of the common bile duct 10 mm without mass or calculus evident in the common bile duct on this study. Pancreas: There is no pancreatic mass or fluid collection. Spleen: No splenic lesions are evident. Adrenals/Urinary Tract: There is a 1.1 x 0.9 cm mass in the left adrenal. Right adrenal appears normal. Kidneys bilaterally show no demonstrable mass or hydronephrosis on either side. There is no renal or ureteral calculus on either side. Urinary bladder is midline with wall thickness within normal limits. Stomach/Bowel: There is fairly diffuse stool throughout colon. There is no bowel wall or mesenteric thickening. No bowel obstruction. No free air or portal venous air. Vascular/Lymphatic: There is no abdominal aortic aneurysm. No vascular lesions are evident. There are a few scattered subcentimeter lymph nodes in the mesentery. By size criteria, there is no adenopathy in the abdomen or pelvis. Reproductive: Uterus is retroverted. There is no pelvic mass or pelvic fluid collection. Other: Appendix appears unremarkable. There is no ascites or abscess in the abdomen or pelvis. There is a small ventral hernia containing only fat. No omental lesions are  demonstrable. Musculoskeletal: There are foci of degenerative change in the lumbar spine. There are no blastic or lytic bone lesions. There is no intramuscular or abdominal wall lesion evident. IMPRESSION: Small mass in left adrenal. A small metastasis must be of concern in this region given the findings on recent chest CT. No adenopathy is apparent in the abdomen or pelvis. There are scattered subcentimeter mesenteric lymph nodes which do not meet size criteria for pathologic significance. No liver lesions are evident. No inflammatory foci are appreciated. No bowel obstruction. No abscess. No renal or ureteral calculus. No hydronephrosis. There is a small ventral hernia containing only fat. There is prominence of the common bile duct without mass or calculus evident. Significance of this finding is uncertain. Electronically Signed: By: Lowella Grip III M.D. On: 12/27/2015 14:08   US Biopsy  Result Date: 12/29/2015 EXAM: Ultrasound-guided core biopsy of the liver Interventional Radiologist:  Criselda Peaches, MD MEDICATIONS: None. ANESTHESIA/SEDATION: Fentanyl  mcg IV; Versed  mg IV Moderate Sedation Time: The patient was continuously monitored during the procedure by the interventional radiology nurse under my direct supervision. FLUOROSCOPY TIME:  Fluoroscopy Time:  minutes  seconds ( mGy). COMPLICATIONS: None immediate. PROCEDURE: Informed consent was obtained from the patient following explanation of the procedure, risks, benefits and alternatives. The patient understands, agrees and consents for the procedure. All questions were addressed. A time out was performed. The right upper quadrant was interrogated with ultrasound. A relatively avascular plane of the liver was identified. A suitable skin entry site was selected and marked. The region was then sterilely prepped and draped in standard fashion using chlorhexidine skin prep. Local anesthesia was attained by infiltration with 1% lidocaine. A  small dermatotomy was made. Under real-time sonographic guidance, a 17 gauge trocar needle was advanced into the liver. Multiple 18 gauge core biopsies were then coaxially obtained. Needle placement was confirmed on all biopsy passes with real-time sonography. Biopsy specimens were placed in formalin and delivered to pathology for further analysis. As the introducer needle was removed, the biopsy tract was embolized with a Gel-Foam slurry. Post biopsy ultrasound imaging demonstrates no active bleeding or perihepatic hematoma. The patient tolerated the procedure well. IMPRESSION: Technically successful ultrasound-guided random core biopsy of the liver. Electronically Signed   By: Jacqulynn Cadet  M.D.   On: 12/29/2015 13:58   Dg Foot 2 Views Right  Result Date: 12/28/2015 CLINICAL DATA:  50 year old female with a history of right great toe pain EXAM: RIGHT FOOT - 2 VIEW COMPARISON:  None. FINDINGS: No displaced fracture identified.  No radiopaque foreign body. Degenerative changes of the interphalangeal joints. Minimal degenerative changes of the metatarsal-phalangeal joints. Degenerative changes of the midfoot and hindfoot. No focal soft tissue swelling. IMPRESSION: Negative for acute bony abnormality. Signed, Dulcy Fanny. Earleen Newport, DO [facility] Electronically Signed   By: [name] D.O.   On: 12/28/2015 16:51   Result Date: 12/28/2015 CLINICAL DATA:  Right upper quadrant pain EXAM: US ABDOMEN LIMITED - RIGHT UPPER QUADRANT COMPARISON:  CT abdomen pelvis dated 12/27/2015 FINDINGS: Gallbladder: Contracted gallbladder with suspected 9 mm gallstone. No gallbladder wall thickening or pericholecystic fluid. Negative sonographic Murphy's sign. Common bile duct: Diameter: 7 mm centrally. Liver: Coarse hepatic echotexture with hyperechoic hepatic parenchyma. No focal hepatic lesion is seen. IMPRESSION: Cholelithiasis, without associated sonographic findings to  suggest acute cholecystitis. Common duct measures 7 mm centrally, perhaps mildly prominent. However, in the absence of abnormal LFTs, this is of dubious clinical significance. Coarse hepatic echotexture with hyperechoic hepatic parenchyma, nonspecific, but raising the possibility of hepatic steatosis. Electronically Signed   By: Julian Hy M.D.   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   Noted to have a small mass in the left adrenal on CT but PET/CT neg for metastatic disease. Patient has been a lifelong nonsmoker  MRI of the brain was negative for any metastatic disease.  High PDL1 expression (90%) on [facility] Neg for EGFR, ALK, ROS-1 and BRAF mutations.  Plan -labs stable. No prohibitive toxicity from chemotherapy at this time. -continue weekly Carboplatin + Taxol while on RT (last dose on 02/13/2016). -will consider 2 additional 3 weekly cycles after completion of RT after a break of 2-3 weeks. -we will plan to rpt PET/CT after completion of planned chemotherapy -if stable disease/response after these treatment would consider Durvalumab. -continue f/u with radiation oncology. -recommended maintenance of good po hydration and food intake  #2 Sinus tachycardia - on toprol XL , no PE on CTA. Maintain good hydration. Pain mx as needed.  #3 neoplasm related pain is currently controlled without significant medications. Clinically likely has sleep apnea . Would need to be careful with sedative medications . Plan -carafate and prn magic mouthwash for radiation esophagitis related symptoms. #3 hypertension   #4 diabetes #5  dyslipidemia #6 asthma Plan -giving insulin SS for uncontrolled hyperglycemia in the setting of steroid use with chemotherapy. -continue f/u with PCP  RTC with Dr Irene Limbo 2 week after with labs.  . Orders Placed This Encounter  Procedures  . CBC & Diff and Retic    Standing Status:   Future    Standing Expiration Date:   02/05/2017  . Comprehensive metabolic panel    Standing Status:   Future    Standing Expiration Date:   02/05/2017  . Magnesium    Standing Status:   Future    Standing Expiration Date:   02/05/2017  . Phosphorus  Standing Status:   Future    Standing Expiration Date:   02/05/2017    All of the patients questions were answered with apparent satisfaction. The patient knows to call the clinic with any problems, questions or concerns.  I spent 20 minutes counseling the patient face to face. The total time spent in the appointment was 20 minutes and more than 50% was on counseling and direct patient cares.    Sullivan Lone MD [place] AAHIVMS [facility] Hematology/Oncology Physician [facility]  (Office):       [phone] (Work cell):  [phone] (Fax):           [phone]

## 2016-02-16 NOTE — Progress Notes (Signed)
[name]    HEMATOLOGY/ONCOLOGY CLINIC NOTE  Date of Service: .01/23/2016  Patient Care Team: Nolene Ebbs, MD as PCP - General (Internal Medicine)  CHIEF COMPLAINTS/PURPOSE OF CONSULTATION:  Newly diagnosed Lung Adenocarcinoma.  Diagnosis Stage IIIB Lung Adenocarcinoma  Treatment -Concurrent Chemo-radiation with Carboplatin/Taxol.  HISTORY OF PRESENTING ILLNESS: Plz see my previous note for details on initial presentation.  INTERVAL HISTORY  Patient is here for her scheduled f/u for chemotherapy. 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 Overall tolerating chemotherapy and RT without any acute issues at this time. No other acute new symptoms.   MEDICAL HISTORY:  Past Medical History:  Diagnosis Date  . Asthma   . Diabetes mellitus   . Hypercholesteremia   . Hypertension   . Lung cancer ([place])   . Neuropathy ([facility])   . Obese     SURGICAL HISTORY: Past Surgical History:  Procedure Laterality Date  . CESAREAN SECTION    . IR GENERIC HISTORICAL  01/08/2016   IR US GUIDE VASC ACCESS RIGHT 01/08/2016 [name], DO [facility]  . IR GENERIC HISTORICAL  01/08/2016   IR FLUORO GUIDE CV LINE RIGHT 01/08/2016 [name], DO [facility]  . TUBAL LIGATION    . US guided core needle biopsy     of right lower neck/supraclavicular lymph nodes    SOCIAL HISTORY: Social History   Social History  . Marital status: Divorced    Spouse name: N/A  . Number of children: N/A  . Years of education: N/A   Occupational History  . Not on file.   Social History Main Topics  . Smoking status: Never Smoker  . Smokeless tobacco: Never Used  . Alcohol use No  . Drug use: No  . Sexual activity: Not Currently   Other Topics Concern  . Not on file   Social History Narrative   No bird or mold exposure. No recent travel.    FAMILY HISTORY: Family History  Problem Relation Age of Onset  . Cancer Neg Hx   . Rheumatologic disease Neg Hx     ALLERGIES:   has No Known Allergies.  MEDICATIONS:  Current Outpatient Prescriptions  Medication Sig Dispense Refill  . aspirin EC 81 MG tablet Take 1 tablet (81 mg total) by mouth daily. 60 tablet 2  . chlorhexidine (PERIDEX) 0.12 % solution Use as directed 15 mLs in the mouth or throat 2 (two) times daily. 120 mL 0  . dexamethasone (DECADRON) 4 MG tablet Take 2 tablets (8 mg total) by mouth daily. Start the day after chemotherapy for 2 days. 30 tablet 1  . EQ ALLERGY RELIEF CHILDRENS 12.5 MG/5ML liquid     . Fluocinonide 0.1 % CREA     . HYDROcodone-acetaminophen (HYCET) 7.5-325 mg/15 ml solution Take 15 mLs by mouth 4 (four) times daily as needed for moderate pain. 473 mL 0  . insulin aspart (NOVOLOG) 100 UNIT/ML FlexPen As per sliding scale instructions 15 mL 2  . Insulin Degludec (TRESIBA FLEXTOUCH) 200 UNIT/ML SOPN Inject 100 Units into the skin daily before breakfast.     . lidocaine-prilocaine (EMLA) cream Apply to affected area once 30 g 3  . magic mouthwash w/lidocaine SOLN Take 5 mLs by mouth 4 (four) times daily as needed for mouth pain. 400 mL 1  . metoprolol succinate (TOPROL-XL) 25 MG 24 hr tablet Take 1 tablet (25 mg total) by mouth daily. 30 tablet 0  . metoprolol tartrate (LOPRESSOR) 25 MG tablet     .  ondansetron (ZOFRAN) 8 MG tablet Take 1 tablet (8 mg total) by mouth 2 (two) times daily as needed for refractory nausea / vomiting. Start on day 3 after chemo. 30 tablet 1  . prochlorperazine (COMPAZINE) 10 MG tablet Take 1 tablet (10 mg total) by mouth every 6 (six) hours as needed (Nausea or vomiting). 30 tablet 1  . sucralfate (CARAFATE) 1 g tablet Take 1 tablet (1 g total) by mouth 4 (four) times daily -  with meals and at bedtime. 5 min before meals for radiation induced esophagitis 120 tablet 2  . Wound Dressings (SONAFINE) Apply 1 application topically 2 (two) times daily.     No current facility-administered medications for this visit.    Facility-Administered Medications Ordered  in Other Visits  Medication Dose Route Frequency Provider Last Rate Last Dose  . 0.9 %  sodium chloride infusion   Intravenous Once [name], MD        REVIEW OF SYSTEMS:    10 Point review of Systems was done is negative except as noted above.  PHYSICAL EXAMINATION: ECOG PERFORMANCE STATUS: 1 - Symptomatic but completely ambulatory  . Vitals:   01/23/16 0947  BP: 99/68  Pulse: 94  Resp: 18  Temp: 98.6 F (37 C)   Filed Weights   01/23/16 0947  Weight: 281 lb 3.2 oz (127.6 kg)   .Body mass index is 41.53 kg/m.  GENERAL:alert, in no acute distress and comfortable SKIN: skin color, texture, turgor are normal, no rashes or significant lesions EYES: normal, conjunctiva are pink and non-injected, sclera clear OROPHARYNX:no exudate, no erythema and lips, buccal mucosa, and tongue normal  NECK: supple, no JVD, thyroid normal size, non-tender, without nodularity LYMPH:  no palpable lymphadenopathy in the cervical, axillary or inguinal.             LUNGS: clear to auscultation with normal respiratory effort. Distant breath sounds  HEART: regular rate & rhythm,  no murmurs and no lower extremity edema ABDOMEN: abdomen obese, soft, non-tender, normoactive bowel sounds  Musculoskeletal: no cyanosis of digits and no clubbing  PSYCH: alert & oriented x 3 with fluent speech NEURO: no focal motor/sensory deficits  LABORATORY DATA:  I have reviewed the data as listed Component     Latest Ref Rng & Units 01/23/2016  WBC     3.9 - 10.3 10e3/uL 4.8  NEUT#     1.5 - 6.5 10e3/uL 4.1  Hemoglobin     11.6 - 15.9 g/dL 9.9 (L)  HCT     34.8 - 46.6 % 30.9 (L)  Platelets     145 - 400 10e3/uL 223  MCV     79.5 - 101.0 fL 81.7  MCH     25.1 - 34.0 pg 26.2  MCHC     31.5 - 36.0 g/dL 32.0  RBC     3.70 - 5.45 10e6/uL 3.78  RDW     11.2 - 14.5 % 12.9  lymph#     0.9 - 3.3 10e3/uL 0.3 (L)  MONO#     0.1 - 0.9 10e3/uL 0.3  Eosinophils Absolute     0.0 - 0.5 10e3/uL 0.1   Basophils Absolute     0.0 - 0.1 10e3/uL 0.0  NEUT%     38.4 - 76.8 % 85.1 (H)  LYMPH%     14.0 - 49.7 % 7.1 (L)  MONO%     0.0 - 14.0 % 6.1  EOS%     0.0 - 7.0 % 1.7  BASO%     0.0 - 2.0 % 0.0  Retic %     0.70 - 2.10 % 0.97  Retic Ct Abs     33.70 - 90.70 10e3/uL 36.67  Immature Retic Fract     1.60 - 10.00 % 2.10  Sodium     136 - 145 mEq/L 137  Potassium     3.5 - 5.1 mEq/L 4.7  Chloride     98 - 109 mEq/L 103  CO2     22 - 29 mEq/L 25  Glucose     70 - 140 mg/dl 249 (H)  BUN     7.0 - 26.0 mg/dL 9.2  Creatinine     0.6 - 1.1 mg/dL 0.7  Total Bilirubin     0.20 - 1.20 mg/dL 0.37  Alkaline Phosphatase     40 - 150 U/L 91  AST     5 - 34 U/L 12  ALT     0 - 55 U/L 12  Total Protein     6.4 - 8.3 g/dL 6.7  Albumin     3.5 - 5.0 g/dL 2.6 (L)  Calcium     8.4 - 10.4 mg/dL 8.9  Anion gap     3 - 11 mEq/L 9  EGFR     >90 ml/min/1.73 m2 >90            RADIOGRAPHIC STUDIES: I have personally reviewed the radiological images as listed and agreed with the findings in the report.   IMPRESSION: 1. Hypermetabolic 1.7 cm peripheral right hilar nodule. Given the peripheral location of this nodule within the right hilum, this could potentially represent a centrally located primary bronchogenic malignancy. 2. Hypermetabolic bulky ipsilateral paratracheal, bilateral prevascular mediastinal and bilateral scalene nodal metastases. 3. Hypermetabolic right lower neck nodal metastases involving right neck nodal levels III, IV and V. 4. No hypermetabolic metastatic disease in the abdomen, pelvis or skeleton. 5. Aortic atherosclerosis.   Electronically Signed   By: Ilona Sorrel M.D.   On: 01/12/2016 09:42    Dg Chest 2 View  Result Date: 12/25/2015 CLINICAL DATA:  Generalize chronic chest pain, shortness of breath, and cough. History of asthma, diabetes, and hypertension. EXAM: CHEST  2 VIEW COMPARISON:  09/15/2015 FINDINGS: Right hilar and right  peritracheal lymphadenopathy is demonstrated similar to prior study. Lungs are clear and expanded. No focal airspace disease or consolidation. No blunting of costophrenic angles. No pneumothorax. Normal heart size and pulmonary vascularity. Mild degenerative changes in the spine. IMPRESSION: Right hilar and right paratracheal lymphadenopathy is demonstrated, similar to prior study. No evidence of active pulmonary disease. Electronically Signed   By: Lucienne Capers M.D.   On: 12/25/2015 21:23   Ct Soft Tissue Neck W Contrast  Result Date: 12/27/2015 CLINICAL DATA:  Sore throat with right-sided swelling. Mediastinal mass on chest CT. EXAM: CT NECK WITH CONTRAST TECHNIQUE: Multidetector CT imaging of the neck was performed using the standard protocol following the bolus administration of intravenous contrast. CONTRAST:  162m ISOVUE-300 IOPAMIDOL (ISOVUE-300) INJECTION 61% COMPARISON:  CTA neck 05/30/2015 FINDINGS: Pharynx and larynx: No pharyngeal mass or inflammatory change. Unremarkable larynx. Salivary glands: Submandibular and parotid glands are unremarkable. Thyroid: Unremarkable. Lymph nodes: There are bulky right level IV and supraclavicular lymph nodes which are new from the prior CTA. These measure up to at least 2 cm in short axis, with evaluation mildly limited by beam hardening. Enlarged right level V lymph nodes are also new and measure up to 1.5  cm in short axis. Subcentimeter short axis lymph nodes in the right neck more superiorly and scattered throughout the left neck (including an 8 mm short axis left level IV lymph node) are all similar to the prior CTA. Vascular: Major vascular structures of the neck appear patent. 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 Limited intracranial: Unremarkable. Visualized orbits: Not imaged. Mastoids and visualized paranasal sinuses: Clear. Skeleton: Mild lower cervical spondylosis. No destructive osseous lesion identified.  subcentimeter sclerotic focus in the base of the C2 spinous processes unchanged, possibly a bone island. Upper chest: Bulky mediastinal lymphadenopathy, more fully evaluated on yesterday's chest CT. Other: None. IMPRESSION: 1. Unremarkable appearance of the pharynx. No evidence of acute process to explain sore throat. 2. Bulky lymphadenopathy in the right lower neck and supraclavicular regions, consistent with metastatic disease. Electronically Signed   By: Logan Bores M.D.   On: 12/27/2015 14:08   Ct Angio Chest Pe W Or Wo Contrast  Result Date: 12/26/2015 CLINICAL DATA:  Acute onset of generalized chest pain, tachycardia, high blood pressure and shortness of breath. Initial encounter. EXAM: CT ANGIOGRAPHY CHEST WITH CONTRAST TECHNIQUE: Multidetector CT imaging of the chest was performed using the standard protocol during bolus administration of intravenous contrast. Multiplanar CT image reconstructions and MIPs were obtained to evaluate the vascular anatomy. CONTRAST:  100 mL of Isovue 370 IV contrast COMPARISON:  Chest radiograph performed 12/25/2015, and CTA of the chest performed 05/30/2015 FINDINGS: Cardiovascular: There is no evidence of pulmonary embolus. There is mild mass effect on vasculature from the patient's mediastinal and right hilar masses. The thoracic aorta remains grossly intact. The great vessels are grossly unremarkable in appearance. Mediastinum/Nodes: There is a large 7.2 x 6.0 x 4.8 cm mass at the right paratracheal region, markedly enlarged from the prior study, partially encasing the right side of the trachea and the right mainstem bronchus, highly suspicious for small cell lung cancer. There is mild mass effect on the right side of the trachea. Right hilar and azygoesophageal recess nodes measure up to 2.0 cm in short axis, concerning for metastatic disease. No pericardial effusion is identified. Scattered prominent anterior mediastinal nodes are seen, measuring up to 1.7 cm in short  axis. Lungs/Pleura: Bibasilar atelectasis is noted. No pleural effusion or pneumothorax is seen. No pulmonary nodules are identified. Upper Abdomen: The visualized portions of the liver and spleen are grossly unremarkable. Musculoskeletal: No acute osseous abnormalities are identified. The visualized musculature is unremarkable in appearance. Review of the MIP images confirms the above findings. IMPRESSION: 1. No evidence of pulmonary embolus. 2. 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 Mild mass-effect on the right side of the trachea. 3. Right hilar and azygoesophageal recess nodes measure up to 2.0 cm in short axis. Prominent anterior mediastinal nodes measure up to 1.7 cm in short axis. These are concerning for metastatic disease. 4. Bibasilar atelectasis noted. These results were called by telephone at the time of interpretation on 12/26/2015 at 5:37 am to [facility] PA, who verbally acknowledged these results. Electronically Signed   By: Garald Balding M.D.   On: 12/26/2015 05:40   Mr Jeri Cos PH Contrast  Result Date: 12/26/2015 CLINICAL DATA:  Lung cancer. Headache. Assess for metastatic disease. EXAM: MRI HEAD WITHOUT AND WITH CONTRAST TECHNIQUE: Multiplanar, multiecho  pulse sequences of the brain and surrounding structures were obtained without and with intravenous contrast. CONTRAST:  74m MULTIHANCE GADOBENATE DIMEGLUMINE 529 MG/ML IV SOLN COMPARISON:  CT 05/30/2015 FINDINGS: Brain: The brain has normal appearance without evidence of malformation, atrophy, old or acute small or large vessel infarction, hemorrhage, hydrocephalus or extra-axial collection. No pituitary abnormality. After contrast administration, no abnormal enhancement occurs. Vascular: Major vessels at the base of the brain show flow. Skull and  upper cervical spine: Normal Sinuses/Orbits: Clear/ normal. Other: None significant. IMPRESSION: Normal examination. No evidence of metastatic disease. No other finding to explain headache. Electronically Signed   By: MNelson ChimesM.D.   On: 12/26/2015 11:27   Ct Abdomen Pelvis W Contrast  Addendum Date: 12/27/2015   ADDENDUM REPORT: 12/27/2015 14:12 ADDENDUM: There is questionable sludge within the gallbladder. It may be prudent to consider ultrasound of the right upper quadrant given questionable sludge in the gallbladder as well as prominence of the common bile duct. Electronically Signed   By: WLowella GripIII M.D.   On: 12/27/2015 14:12   Result Date: 12/27/2015 CLINICAL DATA:  Chest adenopathy with concern for metastatic focus EXAM: CT ABDOMEN AND PELVIS WITH CONTRAST TECHNIQUE: Multidetector CT imaging of the abdomen and pelvis was performed using the standard protocol following bolus administration of intravenous contrast. Oral contrast was also administered. CONTRAST:  1082mISOVUE-300 IOPAMIDOL (ISOVUE-300) INJECTION 61% COMPARISON:  CT abdomen and pelvis May 18, 2010; chest CT December 26, 2015 FINDINGS: Lower chest: Focal opacity in the posterior aspect of the superior segment right lower lobe is increased from 1 day prior and is consistent with either increased atelectasis or new focus infiltrate in this area. Lung bases otherwise are clear. Hepatobiliary: No focal liver lesions are evident. There is no appreciable gallbladder wall thickening. There is prominence of the common bile duct 10 mm without mass or calculus evident in the common bile duct on this study. Pancreas: There is no pancreatic mass or fluid collection. Spleen: No splenic lesions are evident. Adrenals/Urinary Tract: There is a 1.1 x 0.9 cm mass in the left adrenal. Right adrenal appears normal. Kidneys bilaterally show no demonstrable mass or hydronephrosis on either side. There is no renal or ureteral calculus on  either side. Urinary bladder is midline with wall thickness within normal limits. Stomach/Bowel: There is fairly diffuse stool throughout colon. There is no bowel wall or mesenteric thickening. No bowel obstruction. No free air or portal venous air. Vascular/Lymphatic: There is no abdominal aortic aneurysm. No vascular lesions are evident. There are a few scattered subcentimeter lymph nodes in the mesentery. By size criteria, there is no adenopathy in the abdomen or pelvis. Reproductive: Uterus is retroverted. There is no pelvic mass or pelvic fluid collection. Other: Appendix appears unremarkable. There is no ascites or abscess in the abdomen or pelvis. There is a small ventral hernia containing only fat. No omental lesions are demonstrable. Musculoskeletal: There are foci of degenerative change in the lumbar spine. There are no blastic or lytic bone lesions. There is no intramuscular or abdominal wall lesion evident. IMPRESSION: Small mass in left adrenal. A small metastasis must be of concern in this region given the findings on recent chest CT. No adenopathy is apparent in the abdomen or pelvis. There are scattered subcentimeter mesenteric lymph nodes which do not meet size criteria for pathologic significance. No liver lesions are evident. No inflammatory foci are appreciated. No bowel obstruction. No abscess. No renal or ureteral calculus. No hydronephrosis. There is a  small ventral hernia containing only fat. There is prominence of the common bile duct without mass or calculus evident. Significance of this finding is uncertain. Electronically Signed: By: Lowella Grip III M.D. On: 12/27/2015 14:08   US Biopsy  Result Date: 12/29/2015 EXAM: Ultrasound-guided core biopsy of the liver Interventional Radiologist:  Criselda Peaches, MD MEDICATIONS: None. ANESTHESIA/SEDATION: Fentanyl  mcg IV; Versed  mg IV Moderate Sedation Time: The patient was continuously monitored during the procedure by the  interventional radiology nurse under my direct supervision. FLUOROSCOPY TIME:  Fluoroscopy Time:  minutes  seconds ( mGy). COMPLICATIONS: None immediate. PROCEDURE: Informed consent was obtained from the patient following explanation of the procedure, risks, benefits and alternatives. The patient understands, agrees and consents for the procedure. All questions were addressed. A time out was performed. The right upper quadrant was interrogated with ultrasound. A relatively avascular plane of the liver was identified. A suitable skin entry site was selected and marked. The region was then sterilely prepped and draped in standard fashion using chlorhexidine skin prep. Local anesthesia was attained by infiltration with 1% lidocaine. A small dermatotomy was made. Under real-time sonographic guidance, a 17 gauge trocar needle was advanced into the liver. Multiple 18 gauge core biopsies were then coaxially obtained. Needle placement was confirmed on all biopsy passes with real-time sonography. Biopsy specimens were placed in formalin and delivered to pathology for further analysis. As the introducer needle was removed, the biopsy tract was embolized with a Gel-Foam slurry. Post biopsy ultrasound imaging demonstrates no active bleeding or perihepatic hematoma. The patient tolerated the procedure well. IMPRESSION: Technically successful ultrasound-guided random core biopsy of the liver. Electronically Signed   By: Jacqulynn Cadet M.D.   On: 12/29/2015 13:58   Dg Foot 2 Views Right  Result Date: 12/28/2015 CLINICAL DATA:  50 year old female with a history of right great toe pain EXAM: RIGHT FOOT - 2 VIEW COMPARISON:  None. FINDINGS: No displaced fracture identified.  No radiopaque foreign body. Degenerative changes of the interphalangeal joints. Minimal degenerative changes of the metatarsal-phalangeal joints. Degenerative changes of the midfoot and hindfoot. No focal soft tissue swelling. IMPRESSION: Negative for acute  bony abnormality. Signed, Dulcy Fanny. Earleen Newport, DO [facility] Electronically Signed   By: [name] D.O.   On: 12/28/2015 16:51   Result Date: 12/28/2015 CLINICAL DATA:  Right upper quadrant pain EXAM: US ABDOMEN LIMITED - RIGHT UPPER QUADRANT COMPARISON:  CT abdomen pelvis dated 12/27/2015 FINDINGS: Gallbladder: Contracted gallbladder with suspected 9 mm gallstone. No gallbladder wall thickening or pericholecystic fluid. Negative sonographic Murphy's sign. Common bile duct: Diameter: 7 mm centrally. Liver: Coarse hepatic echotexture with hyperechoic hepatic parenchyma. No focal hepatic lesion is seen. IMPRESSION: Cholelithiasis, without associated sonographic findings to suggest acute cholecystitis. Common duct measures 7 mm centrally, perhaps mildly prominent. However, in the absence of abnormal LFTs, this is of dubious clinical significance. Coarse hepatic echotexture with hyperechoic hepatic parenchyma, nonspecific, but raising the possibility of hepatic steatosis. Electronically Signed   By: Julian Hy M.D.   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   Noted to have a small mass in the left adrenal on CT  but PET/CT neg for metastatic disease. Patient has been a lifelong nonsmoker  MRI of the brain was negative for any metastatic disease.  High PDL1 expression (90%) on [facility] Neg for EGFR, ALK, ROS-1 and BRAF mutations.  Plan -labs stable. No prohibitive toxicity from chemotherapy at this time. -continue weekly Carboplatin + Taxol while on RT and then for 2 x 3 weekly  cycles after completion of RT. -if stable disease/response after these treatment would consider Durvalumab. -continue f/u with radiation oncology. -recommended maintenance of good po hydration and food intake  #2 Sinus tachycardia - on toprol XL , no PE on CTA. Maintain good hydration. Pain mx as needed.  #3 neoplasm related pain is currently controlled without significant medications. Clinically likely has sleep apnea . Would need to be careful with sedative medications . Plan -carafate and prn magic mouthwash for radiation esophagitis related symptoms. #3 hypertension   #4 diabetes #5 dyslipidemia #6 asthma Plan -giving insulin SS for uncontrolled hyperglycemia in the setting of steroid use with chemotherapy. -continue f/u with PCP  RTC with Dr Irene Limbo 1 week after with labs.  .No orders of the defined types were placed in this encounter.   All of the patients questions were answered with apparent satisfaction. The patient knows to call the clinic with any problems, questions or concerns.  I spent 20 minutes counseling the patient face to face. The total time spent in the appointment was 20 minutes and more than 50% was on counseling and direct patient cares.    Sullivan Lone MD [place] [facility] Hematology/Oncology Physician [facility]  (Office):       [phone] (Work cell):  [phone] (Fax):           [phone]

## 2016-02-17 ENCOUNTER — Ambulatory Visit: Payer: BC Managed Care – PPO

## 2016-02-17 NOTE — Progress Notes (Signed)
[facility] Follow up:    Philis Fendt, MD [place] [address]   DIAGNOSIS: No matching staging information was found for the patient.  SUMMARY OF ONCOLOGIC HISTORY:  No history exists.    CURRENT THERAPY:  INTERVAL HISTORY: Marilyn [name] 50 y.o. female returns for    Patient Active Problem List   Diagnosis Date Noted  . Dehydration 01/14/2016    Priority: High  . Tachycardia 01/14/2016    Priority: High  . Non-small cell cancer of right lung ([place]) 12/31/2015    Priority: High  . Malignant neoplasm of lung ([place])   . Palliative care by specialist   . Port catheter in place 01/19/2016  . Adenocarcinoma of left lung, stage 3 ([place]) 01/01/2016  . Mediastinal mass   . Chest pain 12/26/2015  . Headache 12/26/2015  . Diabetes mellitus ([place]) 12/26/2015    has No Known Allergies.  MEDICAL HISTORY: Past Medical History:  Diagnosis Date  . Asthma   . Diabetes mellitus   . Hypercholesteremia   . Hypertension   . Lung cancer ([place])   . Neuropathy ([place])   . Obese     SURGICAL HISTORY: Past Surgical History:  Procedure Laterality Date  . CESAREAN SECTION    . IR GENERIC HISTORICAL  01/08/2016   IR US GUIDE VASC ACCESS RIGHT 01/08/2016 [name], DO [facility]  . IR GENERIC HISTORICAL  01/08/2016   IR FLUORO GUIDE CV LINE RIGHT 01/08/2016 [name], DO [facility]  . TUBAL LIGATION    . US guided core needle biopsy     of right lower neck/supraclavicular lymph nodes    SOCIAL HISTORY: Social History   Social History  . Marital status: Divorced    Spouse name: N/A  . Number of children: N/A  . Years of education: N/A   Occupational History  . Not on file.   Social History Main Topics  . Smoking status: Never Smoker  . Smokeless tobacco: Never Used  . Alcohol use No  . Drug use: No  . Sexual activity: Not Currently   Other Topics Concern  . Not on file   Social History Narrative   No bird or mold  exposure. No recent travel.    FAMILY HISTORY: Family History  Problem Relation Age of Onset  . Cancer Neg Hx   . Rheumatologic disease Neg Hx     Review of Systems - Oncology    PHYSICAL EXAMINATION    There were no vitals filed for this visit.  Physical Exam  LABORATORY DATA:  CBC    Component Value Date/Time   WBC 2.1 (L) 02/13/2016 0843   WBC 11.8 (H) 01/08/2016 1310   RBC 3.87 02/13/2016 0843   RBC 3.90 01/08/2016 1310   HGB 10.3 (L) 02/13/2016 0843   HCT 31.6 (L) 02/13/2016 0843   PLT 172 02/13/2016 0843   MCV 81.7 02/13/2016 0843   MCH 26.6 02/13/2016 0843   MCH 26.4 01/08/2016 1310   MCHC 32.6 02/13/2016 0843   MCHC 32.6 01/08/2016 1310   RDW 14.4 02/13/2016 0843   LYMPHSABS 0.3 (L) 02/13/2016 0843   MONOABS 0.3 02/13/2016 0843   EOSABS 0.0 02/13/2016 0843   BASOSABS 0.0 02/13/2016 0843    CMP     Component Value Date/Time   NA 142 02/13/2016 0843   K 3.9 02/13/2016 0843   CL 103 01/08/2016 1310   CO2 25 02/13/2016 0843   GLUCOSE 146 (H) 02/13/2016  0843   BUN 7.9 02/13/2016 0843   CREATININE 0.7 02/13/2016 0843   CALCIUM 8.7 02/13/2016 0843   PROT 6.3 (L) 02/13/2016 0843   ALBUMIN 2.5 (L) 02/13/2016 0843   AST 16 02/13/2016 0843   ALT 19 02/13/2016 0843   ALKPHOS 76 02/13/2016 0843   BILITOT 0.34 02/13/2016 0843   GFRNONAA >60 01/08/2016 1310   GFRAA >60 01/08/2016 1310       PENDING LABS:   RADIOGRAPHIC STUDIES:  No results found.   PATHOLOGY:     ASSESSMENT and THERAPY PLAN:   No problem-specific Assessment & Plan notes found for this encounter.   No orders of the defined types were placed in this encounter.   All questions were answered. The patient knows to call the clinic with any problems, questions or concerns. We can certainly see the patient much sooner if necessary. This note was electronically signed. Drue Second, NP 02/17/2016

## 2016-02-17 NOTE — Progress Notes (Signed)
[facility] Follow up:    Marilyn Fendt, MD [place] [address]   DIAGNOSIS: No matching staging information was found for the patient.  SUMMARY OF ONCOLOGIC HISTORY:  No history exists.    CURRENT THERAPY:  INTERVAL HISTORY: Marilyn [name] 50 y.o. female returns for    Patient Active Problem List   Diagnosis Date Noted  . Dehydration 01/14/2016    Priority: High  . Tachycardia 01/14/2016    Priority: High  . Non-small cell cancer of right lung ([place]) 12/31/2015    Priority: High  . Malignant neoplasm of lung ([facility])   . Palliative care by specialist   . Port catheter in place 01/19/2016  . Adenocarcinoma of left lung, stage 3 ([place]) 01/01/2016  . Mediastinal mass   . Chest pain 12/26/2015  . Headache 12/26/2015  . Diabetes mellitus ([place]) 12/26/2015    has No Known Allergies.  MEDICAL HISTORY: Past Medical History:  Diagnosis Date  . Asthma   . Diabetes mellitus   . Hypercholesteremia   . Hypertension   . Lung cancer ([place])   . Neuropathy ([place])   . Obese     SURGICAL HISTORY: Past Surgical History:  Procedure Laterality Date  . CESAREAN SECTION    . IR GENERIC HISTORICAL  01/08/2016   IR US GUIDE VASC ACCESS RIGHT 01/08/2016 [name], DO [facility]  . IR GENERIC HISTORICAL  01/08/2016   IR FLUORO GUIDE CV LINE RIGHT 01/08/2016 [name], DO [facility]  . TUBAL LIGATION    . US guided core needle biopsy     of right lower neck/supraclavicular lymph nodes    SOCIAL HISTORY: Social History   Social History  . Marital status: Divorced    Spouse name: N/A  . Number of children: N/A  . Years of education: N/A   Occupational History  . Not on file.   Social History Main Topics  . Smoking status: Never Smoker  . Smokeless tobacco: Never Used  . Alcohol use No  . Drug use: No  . Sexual activity: Not Currently   Other Topics Concern  . Not on file   Social History Narrative   No bird or mold  exposure. No recent travel.    FAMILY HISTORY: Family History  Problem Relation Age of Onset  . Cancer Neg Hx   . Rheumatologic disease Neg Hx     Review of Systems - Oncology    PHYSICAL EXAMINATION    There were no vitals filed for this visit.  Physical Exam LABORATORY DATA:  CBC    Component Value Date/Time   WBC 2.1 (L) 02/13/2016 0843   WBC 11.8 (H) 01/08/2016 1310   RBC 3.87 02/13/2016 0843   RBC 3.90 01/08/2016 1310   HGB 10.3 (L) 02/13/2016 0843   HCT 31.6 (L) 02/13/2016 0843   PLT 172 02/13/2016 0843   MCV 81.7 02/13/2016 0843   MCH 26.6 02/13/2016 0843   MCH 26.4 01/08/2016 1310   MCHC 32.6 02/13/2016 0843   MCHC 32.6 01/08/2016 1310   RDW 14.4 02/13/2016 0843   LYMPHSABS 0.3 (L) 02/13/2016 0843   MONOABS 0.3 02/13/2016 0843   EOSABS 0.0 02/13/2016 0843   BASOSABS 0.0 02/13/2016 0843    CMP     Component Value Date/Time   NA 142 02/13/2016 0843   K 3.9 02/13/2016 0843   CL 103 01/08/2016 1310   CO2 25 02/13/2016 0843   GLUCOSE 146 (H) 02/13/2016 5809  BUN 7.9 02/13/2016 0843   CREATININE 0.7 02/13/2016 0843   CALCIUM 8.7 02/13/2016 0843   PROT 6.3 (L) 02/13/2016 0843   ALBUMIN 2.5 (L) 02/13/2016 0843   AST 16 02/13/2016 0843   ALT 19 02/13/2016 0843   ALKPHOS 76 02/13/2016 0843   BILITOT 0.34 02/13/2016 0843   GFRNONAA >60 01/08/2016 1310   GFRAA >60 01/08/2016 1310

## 2016-02-17 NOTE — Progress Notes (Signed)
02/06/16 visit was a nursing visit only. No provider saw pt.

## 2016-02-18 ENCOUNTER — Ambulatory Visit: Payer: BC Managed Care – PPO

## 2016-02-19 ENCOUNTER — Other Ambulatory Visit: Payer: Self-pay

## 2016-02-19 ENCOUNTER — Encounter: Payer: Self-pay | Admitting: *Deleted

## 2016-02-19 ENCOUNTER — Other Ambulatory Visit: Payer: Self-pay | Admitting: *Deleted

## 2016-02-19 ENCOUNTER — Ambulatory Visit: Payer: Self-pay | Admitting: Hematology

## 2016-02-20 ENCOUNTER — Ambulatory Visit: Payer: Self-pay

## 2016-02-20 ENCOUNTER — Other Ambulatory Visit: Payer: Self-pay

## 2016-02-20 ENCOUNTER — Ambulatory Visit (HOSPITAL_BASED_OUTPATIENT_CLINIC_OR_DEPARTMENT_OTHER): Payer: BC Managed Care – PPO

## 2016-02-20 DIAGNOSIS — Z452 Encounter for adjustment and management of vascular access device: Secondary | ICD-10-CM

## 2016-02-20 DIAGNOSIS — C3491 Malignant neoplasm of unspecified part of right bronchus or lung: Secondary | ICD-10-CM

## 2016-02-20 DIAGNOSIS — Z95828 Presence of other vascular implants and grafts: Secondary | ICD-10-CM

## 2016-02-20 MED ORDER — SODIUM CHLORIDE 0.9 % IJ SOLN
10.0000 mL | INTRAMUSCULAR | Status: DC | PRN
  Administered 2016-02-20: 10 mL via INTRAVENOUS
  Filled 2016-02-20: qty 10

## 2016-02-20 MED ORDER — HEPARIN SOD (PORK) LOCK FLUSH 100 UNIT/ML IV SOLN
500.0000 [IU] | Freq: Once | INTRAVENOUS | Status: AC | PRN
  Administered 2016-02-20: 500 [IU] via INTRAVENOUS
  Filled 2016-02-20: qty 5

## 2016-02-22 NOTE — Progress Notes (Signed)
  Radiation Oncology         [phone] ________________________________  Name: Marilyn [name] MRN: [id number]  Date: 02/13/2016  DOB: December 09, 1965   End of Treatment Note  Diagnosis: 50 y.o. 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  Beams/energy:   A five field 3D conformal treatment arrangement was used delivering 6 and 15 MV photons.  Daily image-guidance CT was used to align the treatment with the targeted volume  Narrative: The patient tolerated radiation treatment relatively well. The patient's plan was changed based on the results of her PET scan and to assess for possible tumor regression in hopes of reducing exposure to uninvolved lung as treatment was continued to higher doses. The patient's right chest, shoulder, and right neck pain improved during treatment. The patient had pain associated with swallowing managed with Hycet, magic mouthwash, and carafate. The patient had a persistent productive cough with clear sputum, some SOB, and moderate fatigue. The patient denied hemoptysis. Towards the end of treatment, the patient developed some desquamation at the base of the right neck and along the midline about 4 to 5 cm above the supraclavicular region. Neosporin was given to the patient to apply to those areas.  Plan: The patient has completed radiation treatment. The patient will return to radiation oncology clinic for routine followup in one month. I advised her to call or return sooner if she has any questions or concerns related to her recovery or treatment.  ________________________________  Sheral Apley. Tammi Klippel, M.D.

## 2016-02-27 ENCOUNTER — Ambulatory Visit (HOSPITAL_BASED_OUTPATIENT_CLINIC_OR_DEPARTMENT_OTHER): Payer: BC Managed Care – PPO

## 2016-02-27 DIAGNOSIS — Z452 Encounter for adjustment and management of vascular access device: Secondary | ICD-10-CM | POA: Diagnosis not present

## 2016-02-27 DIAGNOSIS — C3491 Malignant neoplasm of unspecified part of right bronchus or lung: Secondary | ICD-10-CM | POA: Diagnosis not present

## 2016-02-27 DIAGNOSIS — Z95828 Presence of other vascular implants and grafts: Secondary | ICD-10-CM

## 2016-02-27 MED ORDER — SODIUM CHLORIDE 0.9 % IJ SOLN
10.0000 mL | INTRAMUSCULAR | Status: DC | PRN
  Administered 2016-02-27: 10 mL via INTRAVENOUS
  Filled 2016-02-27: qty 10

## 2016-02-27 MED ORDER — HEPARIN SOD (PORK) LOCK FLUSH 100 UNIT/ML IV SOLN
500.0000 [IU] | Freq: Once | INTRAVENOUS | Status: AC | PRN
  Administered 2016-02-27: 250 [IU] via INTRAVENOUS
  Filled 2016-02-27: qty 5

## 2016-03-01 ENCOUNTER — Ambulatory Visit (HOSPITAL_BASED_OUTPATIENT_CLINIC_OR_DEPARTMENT_OTHER): Payer: BC Managed Care – PPO

## 2016-03-01 DIAGNOSIS — C3491 Malignant neoplasm of unspecified part of right bronchus or lung: Secondary | ICD-10-CM | POA: Diagnosis not present

## 2016-03-01 DIAGNOSIS — Z95828 Presence of other vascular implants and grafts: Secondary | ICD-10-CM

## 2016-03-01 DIAGNOSIS — Z452 Encounter for adjustment and management of vascular access device: Secondary | ICD-10-CM

## 2016-03-01 MED ORDER — SODIUM CHLORIDE 0.9 % IJ SOLN
10.0000 mL | INTRAMUSCULAR | Status: DC | PRN
  Administered 2016-03-01: 10 mL via INTRAVENOUS
  Filled 2016-03-01: qty 10

## 2016-03-01 MED ORDER — HEPARIN SOD (PORK) LOCK FLUSH 100 UNIT/ML IV SOLN
500.0000 [IU] | Freq: Once | INTRAVENOUS | Status: AC | PRN
  Administered 2016-03-01: 250 [IU] via INTRAVENOUS
  Filled 2016-03-01: qty 5

## 2016-03-03 ENCOUNTER — Encounter: Payer: Self-pay | Admitting: Hematology

## 2016-03-03 ENCOUNTER — Ambulatory Visit: Payer: Self-pay | Admitting: Hematology

## 2016-03-03 ENCOUNTER — Other Ambulatory Visit (HOSPITAL_BASED_OUTPATIENT_CLINIC_OR_DEPARTMENT_OTHER): Payer: BC Managed Care – PPO

## 2016-03-03 ENCOUNTER — Ambulatory Visit: Payer: BC Managed Care – PPO

## 2016-03-03 ENCOUNTER — Ambulatory Visit (HOSPITAL_BASED_OUTPATIENT_CLINIC_OR_DEPARTMENT_OTHER): Payer: BC Managed Care – PPO | Admitting: Hematology

## 2016-03-03 ENCOUNTER — Other Ambulatory Visit: Payer: Self-pay

## 2016-03-03 ENCOUNTER — Other Ambulatory Visit: Payer: Self-pay | Admitting: *Deleted

## 2016-03-03 VITALS — BP 129/82 | HR 109 | Temp 98.0°F | Resp 18 | Ht 69.0 in | Wt 275.6 lb

## 2016-03-03 DIAGNOSIS — I10 Essential (primary) hypertension: Secondary | ICD-10-CM

## 2016-03-03 DIAGNOSIS — E279 Disorder of adrenal gland, unspecified: Secondary | ICD-10-CM

## 2016-03-03 DIAGNOSIS — C3491 Malignant neoplasm of unspecified part of right bronchus or lung: Secondary | ICD-10-CM

## 2016-03-03 DIAGNOSIS — C3492 Malignant neoplasm of unspecified part of left bronchus or lung: Secondary | ICD-10-CM

## 2016-03-03 DIAGNOSIS — K1233 Oral mucositis (ulcerative) due to radiation: Secondary | ICD-10-CM

## 2016-03-03 DIAGNOSIS — C770 Secondary and unspecified malignant neoplasm of lymph nodes of head, face and neck: Secondary | ICD-10-CM

## 2016-03-03 DIAGNOSIS — K208 Other esophagitis: Secondary | ICD-10-CM

## 2016-03-03 DIAGNOSIS — G893 Neoplasm related pain (acute) (chronic): Secondary | ICD-10-CM | POA: Diagnosis not present

## 2016-03-03 DIAGNOSIS — E119 Type 2 diabetes mellitus without complications: Secondary | ICD-10-CM

## 2016-03-03 DIAGNOSIS — R739 Hyperglycemia, unspecified: Secondary | ICD-10-CM

## 2016-03-03 DIAGNOSIS — Z95828 Presence of other vascular implants and grafts: Secondary | ICD-10-CM

## 2016-03-03 DIAGNOSIS — R000 Tachycardia, unspecified: Secondary | ICD-10-CM

## 2016-03-03 LAB — CBC & DIFF AND RETIC
BASO%: 0 % (ref 0.0–2.0)
BASOS ABS: 0 10*3/uL (ref 0.0–0.1)
EOS%: 0.6 % (ref 0.0–7.0)
Eosinophils Absolute: 0 10*3/uL (ref 0.0–0.5)
HEMATOCRIT: 30.6 % — AB (ref 34.8–46.6)
HEMOGLOBIN: 10.2 g/dL — AB (ref 11.6–15.9)
Immature Retic Fract: 12.4 % — ABNORMAL HIGH (ref 1.60–10.00)
LYMPH#: 1.3 10*3/uL (ref 0.9–3.3)
LYMPH%: 37.2 % (ref 14.0–49.7)
MCH: 27.2 pg (ref 25.1–34.0)
MCHC: 33.3 g/dL (ref 31.5–36.0)
MCV: 81.6 fL (ref 79.5–101.0)
MONO#: 0.4 10*3/uL (ref 0.1–0.9)
MONO%: 10.2 % (ref 0.0–14.0)
NEUT#: 1.8 10*3/uL (ref 1.5–6.5)
NEUT%: 52 % (ref 38.4–76.8)
PLATELETS: 201 10*3/uL (ref 145–400)
RBC: 3.75 10*6/uL (ref 3.70–5.45)
RDW: 15.9 % — ABNORMAL HIGH (ref 11.2–14.5)
RETIC %: 3.37 % — AB (ref 0.70–2.10)
Retic Ct Abs: 126.38 10*3/uL — ABNORMAL HIGH (ref 33.70–90.70)
WBC: 3.5 10*3/uL — ABNORMAL LOW (ref 3.9–10.3)
nRBC: 0 % (ref 0–0)

## 2016-03-03 LAB — COMPREHENSIVE METABOLIC PANEL
ALBUMIN: 2.8 g/dL — AB (ref 3.5–5.0)
ALK PHOS: 82 U/L (ref 40–150)
ALT: 15 U/L (ref 0–55)
AST: 15 U/L (ref 5–34)
Anion Gap: 9 mEq/L (ref 3–11)
CALCIUM: 8.9 mg/dL (ref 8.4–10.4)
CO2: 24 mEq/L (ref 22–29)
CREATININE: 0.7 mg/dL (ref 0.6–1.1)
Chloride: 104 mEq/L (ref 98–109)
EGFR: >90 mL/min/{1.73_m2} (ref >90)
Glucose: 366 mg/dl — ABNORMAL HIGH (ref 70–140)
POTASSIUM: 3.8 meq/L (ref 3.5–5.1)
Sodium: 138 mEq/L (ref 136–145)
Total Bilirubin: 0.44 mg/dL (ref 0.20–1.20)
Total Protein: 6.8 g/dL (ref 6.4–8.3)

## 2016-03-03 LAB — MAGNESIUM: MAGNESIUM: 1.1 mg/dL — AB (ref 1.5–2.5)

## 2016-03-03 MED ORDER — HEPARIN SOD (PORK) LOCK FLUSH 100 UNIT/ML IV SOLN
500.0000 [IU] | Freq: Once | INTRAVENOUS | Status: AC | PRN
  Administered 2016-03-03: 250 [IU] via INTRAVENOUS
  Filled 2016-03-03: qty 5

## 2016-03-03 MED ORDER — SODIUM CHLORIDE 0.9 % IJ SOLN
10.0000 mL | INTRAMUSCULAR | Status: DC | PRN
  Administered 2016-03-03: 10 mL via INTRAVENOUS
  Filled 2016-03-03: qty 10

## 2016-03-03 MED ORDER — MAGNESIUM OXIDE 400 (241.3 MG) MG PO TABS: 400.0000 mg | ORAL_TABLET | Freq: Two times a day (BID) | ORAL | 0 refills | Status: AC

## 2016-03-03 NOTE — Progress Notes (Signed)
[name]    HEMATOLOGY/ONCOLOGY CLINIC NOTE  Date of Service: .03/03/2016  Patient Care Team: Nolene Ebbs, MD as PCP - General (Internal Medicine)  CHIEF COMPLAINTS/PURPOSE OF CONSULTATION:  Newly diagnosed Lung Adenocarcinoma.  Diagnosis Stage IIIB Lung Adenocarcinoma  Treatment -Concurrent Chemo-radiation with Carboplatin/Taxol. Completing RT on 02/13/2016  HISTORY OF PRESENTING ILLNESS: Plz see my previous note for details on initial presentation.  INTERVAL HISTORY  Patient is here for her scheduled f/u after completion of her concurrent chemotherapy radiation on 02/13/2016. She developed some skin desquamation over her right lower neck and upper chest which is nearly healed up. Still some grade 1 radiation esophagitis that is improving and is controlled with her Carafate and Magic mouthwash. She notes that she is eating well and about the same as she normally does. She was encouraged to be more physically active since she isn't walking as much as she needs to. No other acute new symptoms. No fevers or chills. We discussed about doing 2 additional cycles of carboplatin Taxol chemotherapy with Neulasta support which she is okay with.   MEDICAL HISTORY:  Past Medical History:  Diagnosis Date  . Asthma   . Diabetes mellitus   . Hypercholesteremia   . Hypertension   . Lung cancer ([place])   . Neuropathy ([place])   . Obese     SURGICAL HISTORY: Past Surgical History:  Procedure Laterality Date  . CESAREAN SECTION    . IR GENERIC HISTORICAL  01/08/2016   IR US GUIDE VASC ACCESS RIGHT 01/08/2016 [name], DO [facility]  . IR GENERIC HISTORICAL  01/08/2016   IR FLUORO GUIDE CV LINE RIGHT 01/08/2016 [name], DO [facility]  . TUBAL LIGATION    . US guided core needle biopsy     of right lower neck/supraclavicular lymph nodes    SOCIAL HISTORY: Social History   Social History  . Marital status: Divorced    Spouse name: N/A  . Number of children: N/A  . Years  of education: N/A   Occupational History  . Not on file.   Social History Main Topics  . Smoking status: Never Smoker  . Smokeless tobacco: Never Used  . Alcohol use No  . Drug use: No  . Sexual activity: Not Currently   Other Topics Concern  . Not on file   Social History Narrative   No bird or mold exposure. No recent travel.    FAMILY HISTORY: Family History  Problem Relation Age of Onset  . Cancer Neg Hx   . Rheumatologic disease Neg Hx     ALLERGIES:  has No Known Allergies.  MEDICATIONS:  Current Outpatient Prescriptions  Medication Sig Dispense Refill  . aspirin EC 81 MG tablet Take 1 tablet (81 mg total) by mouth daily. 60 tablet 2  . chlorhexidine (PERIDEX) 0.12 % solution Use as directed 15 mLs in the mouth or throat 2 (two) times daily. 120 mL 0  . EQ ALLERGY RELIEF CHILDRENS 12.5 MG/5ML liquid     . Fluocinonide 0.1 % CREA     . HYDROcodone-acetaminophen (HYCET) 7.5-325 mg/15 ml solution Take 15 mLs by mouth 4 (four) times daily as needed for moderate pain. 473 mL 0  . insulin aspart (NOVOLOG) 100 UNIT/ML FlexPen As per sliding scale instructions 15 mL 2  . Insulin Degludec (TRESIBA FLEXTOUCH) 200 UNIT/ML SOPN Inject 100 Units into the skin daily before breakfast.     . magic mouthwash w/lidocaine SOLN Take 5 mLs by mouth 4 (four) times daily as  needed for mouth pain. 400 mL 1  . metoprolol succinate (TOPROL-XL) 25 MG 24 hr tablet Take 1 tablet (25 mg total) by mouth daily. 30 tablet 0  . metoprolol tartrate (LOPRESSOR) 25 MG tablet     . sucralfate (CARAFATE) 1 g tablet Take 1 tablet (1 g total) by mouth 4 (four) times daily -  with meals and at bedtime. 5 min before meals for radiation induced esophagitis 120 tablet 2  . Wound Dressings (SONAFINE) Apply 1 application topically 2 (two) times daily.     No current facility-administered medications for this visit.     REVIEW OF SYSTEMS:    10 Point review of Systems was done is negative except as noted  above.  PHYSICAL EXAMINATION: ECOG PERFORMANCE STATUS: 1 - Symptomatic but completely ambulatory  . Vitals:   03/03/16 0902  BP: 129/82  Pulse: (!) 109  Resp: 18  Temp: 98 F (36.7 C)   Filed Weights   03/03/16 0902  Weight: 275 lb 9.6 oz (125 kg)   .Body mass index is 40.7 kg/m.  GENERAL:alert, in no acute distress and comfortable SKIN: skin color, texture, turgor are normal, no rashes or significant lesions EYES: normal, conjunctiva are pink and non-injected, sclera clear OROPHARYNX:no exudate, no erythema and lips, buccal mucosa, and tongue normal  NECK: supple, no JVD, thyroid normal size, non-tender, without nodularity LYMPH:  no palpable lymphadenopathy in the cervical, axillary or inguinal.             LUNGS: clear to auscultation with normal respiratory effort. Distant breath sounds  HEART: regular rate & rhythm,  no murmurs and no lower extremity edema ABDOMEN: abdomen obese, soft, non-tender, normoactive bowel sounds  Musculoskeletal: no cyanosis of digits and no clubbing  PSYCH: alert & oriented x 3 with fluent speech NEURO: no focal motor/sensory deficits  LABORATORY DATA: . CBC Latest Ref Rng & Units 03/03/2016 02/13/2016 02/06/2016  WBC 3.9 - 10.3 10e3/uL 3.5(L) 2.1(L) 2.4(L)  Hemoglobin 11.6 - 15.9 g/dL 10.2(L) 10.3(L) 10.2(L)  Hematocrit 34.8 - 46.6 % 30.6(L) 31.6(L) 31.7(L)  Platelets 145 - 400 10e3/uL 201 172 169   . CMP Latest Ref Rng & Units 03/03/2016 02/13/2016 02/06/2016  Glucose 70 - 140 mg/dl 366(H) 146(H) 147(H)  BUN 7.0 - 26.0 mg/dL <4.0(L) 7.9 8.1  Creatinine 0.6 - 1.1 mg/dL 0.7 0.7 0.7  Sodium 136 - 145 mEq/L 138 142 140  Potassium 3.5 - 5.1 mEq/L 3.8 3.9 3.9  Chloride 101 - 111 mmol/L - - -  CO2 22 - 29 mEq/L _0 Calcium 8.4 - 10.4 mg/dL 8.9 8.7 8.7  Total Protein 6.4 - 8.3 g/dL 6.8 6.3(L) 6.5  Total Bilirubin 0.20 - 1.20 mg/dL 0.44 0.34 0.53  Alkaline Phos 40 - 150 U/L 82 76 80  AST 5 - 34 U/L _1 ALT 0 - 55 U/L _2 Magnesium 1.1.           RADIOGRAPHIC STUDIES: I have personally reviewed the radiological images as listed and agreed with the findings in the report. PET/CT IMPRESSION: 1. Hypermetabolic 1.7 cm peripheral right hilar nodule. Given the peripheral location of this nodule within the right hilum, this could potentially represent a centrally located primary bronchogenic malignancy. 2. Hypermetabolic bulky ipsilateral paratracheal, bilateral prevascular mediastinal and bilateral scalene nodal metastases. 3. Hypermetabolic right lower neck nodal metastases involving right neck nodal levels III, IV and V. 4. No hypermetabolic metastatic disease in the abdomen, pelvis or  skeleton. 5. Aortic atherosclerosis.   Electronically Signed   By: Ilona Sorrel M.D.   On: 01/12/2016 09:42      ASSESSMENT & PLAN:   50 year old African-American female with history of hypertension, diabetes, asthma, dysuria mass and morbid obesity with   #1 Newly diagnosed Lung Adenocarcinoma Rt sided atleast Stage IIIB with large right paratracheal mass with mediastinal adenopathy that appears to have grown significantly over the last 6-7 months and rt supraclavicular LN +.   Noted to have a small mass in the left adrenal on CT but PET/CT neg for metastatic disease. Patient has been a lifelong nonsmoker  MRI of the brain was negative for any metastatic disease.  High PDL1 expression (90%) on [facility] One Neg for EGFR, ALK, ROS-1 and BRAF mutations.   patient has completed her planned definitive chemoradiation with carbo Taxol on 02/13/2016. No prohibitive toxicities other than some grade 1 skin desquamation and some grade 1-2 radiation esophagitis Plan -labs stable. No prohibitive toxicity from chemotherapy at this time. -We shall get another 7-10 days to recover from her concurrent chemoradiation. -We will plan to start her on carboplatin/Taxol with Neulasta support every 3 weeks 2 doses  starting on 03/11/2016. -I shall see her back in 7-10 days after her first dose of chemotherapy for toxicity check. --we will plan to rpt PET/CT after completion of planned chemotherapy -if stable disease/response after these treatment would consider Durvalumab. -continue f/u with radiation oncology. -recommended maintenance of good po hydration and food intake  #2 Sinus tachycardia - on toprol XL , no PE on CTA. Maintain good hydration. Pain mx as needed.  #3 neoplasm related pain is currently controlled without significant medications. Clinically likely has sleep apnea . Would need to be careful with sedative medications . Plan -carafate and prn magic mouthwash for radiation esophagitis related symptoms. #3 hypertension   #4 diabetes #5 dyslipidemia #6 asthma Plan -giving insulin SS for uncontrolled hyperglycemia in the setting of steroid use with chemotherapy. -continue f/u with PCP  #7 hypomagnesemia -Magnesium oxide 400 mg by mouth twice a day for 10 days.  RTC with Dr Irene Limbo one week after carbotaxol chemotherapy for toxicity check with repeat labs .  [name] No orders of the defined types were placed in this encounter.   All of the patients questions were answered with apparent satisfaction. The patient knows to call the clinic with any problems, questions or concerns.  I spent 20 minutes counseling the patient face to face. The total time spent in the appointment was 25 minutes and more than 50% was on counseling and direct patient cares.    Sullivan Lone MD [place] AAHIVMS [facility] Hematology/Oncology Physician [facility]  (Office):       [phone] (Work cell):  [phone] (Fax):           [phone]

## 2016-03-04 ENCOUNTER — Other Ambulatory Visit: Payer: Self-pay | Admitting: Nurse Practitioner

## 2016-03-04 DIAGNOSIS — C3491 Malignant neoplasm of unspecified part of right bronchus or lung: Secondary | ICD-10-CM

## 2016-03-04 LAB — PHOSPHORUS: PHOSPHORUS: 4 mg/dL (ref 2.5–4.5)

## 2016-03-05 ENCOUNTER — Ambulatory Visit (HOSPITAL_BASED_OUTPATIENT_CLINIC_OR_DEPARTMENT_OTHER): Payer: Medicaid Other

## 2016-03-05 DIAGNOSIS — C3491 Malignant neoplasm of unspecified part of right bronchus or lung: Secondary | ICD-10-CM | POA: Diagnosis not present

## 2016-03-05 DIAGNOSIS — C770 Secondary and unspecified malignant neoplasm of lymph nodes of head, face and neck: Secondary | ICD-10-CM

## 2016-03-05 DIAGNOSIS — Z452 Encounter for adjustment and management of vascular access device: Secondary | ICD-10-CM

## 2016-03-05 DIAGNOSIS — Z95828 Presence of other vascular implants and grafts: Secondary | ICD-10-CM

## 2016-03-05 MED ORDER — HEPARIN SOD (PORK) LOCK FLUSH 100 UNIT/ML IV SOLN
500.0000 [IU] | Freq: Once | INTRAVENOUS | Status: AC | PRN
Start: 2016-03-05 — End: 2016-03-05
  Administered 2016-03-05: 500 [IU] via INTRAVENOUS
  Filled 2016-03-05: qty 5

## 2016-03-05 MED ORDER — SODIUM CHLORIDE 0.9 % IJ SOLN
10.0000 mL | INTRAMUSCULAR | Status: DC | PRN
  Administered 2016-03-05: 10 mL via INTRAVENOUS
  Filled 2016-03-05: qty 10

## 2016-03-08 ENCOUNTER — Encounter: Payer: Self-pay | Admitting: Hematology

## 2016-03-08 NOTE — Progress Notes (Signed)
Left msg for pt to return my call to discuss drug replacement for Aloxi and Neulasta.

## 2016-03-10 ENCOUNTER — Encounter: Payer: Self-pay | Admitting: Hematology

## 2016-03-10 ENCOUNTER — Telehealth: Payer: Self-pay | Admitting: Hematology

## 2016-03-10 NOTE — Progress Notes (Signed)
Pt returned my call and informed me she no longer has [employer] because she lost her job but she does have Medicaid.  She will present her card on her next visit at registration.

## 2016-03-10 NOTE — Telephone Encounter (Signed)
Spoke with patient re next appointment for 12/8. Patient will get new schedule 12/8.

## 2016-03-12 ENCOUNTER — Ambulatory Visit: Payer: Medicaid Other

## 2016-03-12 ENCOUNTER — Other Ambulatory Visit (HOSPITAL_BASED_OUTPATIENT_CLINIC_OR_DEPARTMENT_OTHER): Payer: Medicaid Other

## 2016-03-12 ENCOUNTER — Encounter: Payer: Self-pay | Admitting: Nurse Practitioner

## 2016-03-12 ENCOUNTER — Ambulatory Visit (HOSPITAL_BASED_OUTPATIENT_CLINIC_OR_DEPARTMENT_OTHER): Payer: Medicaid Other | Admitting: Nurse Practitioner

## 2016-03-12 ENCOUNTER — Ambulatory Visit (HOSPITAL_BASED_OUTPATIENT_CLINIC_OR_DEPARTMENT_OTHER): Payer: Medicaid Other

## 2016-03-12 VITALS — BP 121/82 | HR 147 | Temp 98.0°F | Resp 18

## 2016-03-12 DIAGNOSIS — C3401 Malignant neoplasm of right main bronchus: Secondary | ICD-10-CM

## 2016-03-12 DIAGNOSIS — Z5111 Encounter for antineoplastic chemotherapy: Secondary | ICD-10-CM

## 2016-03-12 DIAGNOSIS — R000 Tachycardia, unspecified: Secondary | ICD-10-CM | POA: Diagnosis not present

## 2016-03-12 DIAGNOSIS — E0800 Diabetes mellitus due to underlying condition with hyperosmolarity without nonketotic hyperglycemic-hyperosmolar coma (NKHHC): Secondary | ICD-10-CM

## 2016-03-12 DIAGNOSIS — E119 Type 2 diabetes mellitus without complications: Secondary | ICD-10-CM

## 2016-03-12 DIAGNOSIS — C3491 Malignant neoplasm of unspecified part of right bronchus or lung: Secondary | ICD-10-CM

## 2016-03-12 DIAGNOSIS — C3492 Malignant neoplasm of unspecified part of left bronchus or lung: Secondary | ICD-10-CM

## 2016-03-12 LAB — CBC & DIFF AND RETIC
BASO%: 0 % (ref 0.0–2.0)
Basophils Absolute: 0 10*3/uL (ref 0.0–0.1)
EOS%: 0.5 % (ref 0.0–7.0)
Eosinophils Absolute: 0 10*3/uL (ref 0.0–0.5)
HCT: 31.9 % — ABNORMAL LOW (ref 34.8–46.6)
HGB: 10.3 g/dL — ABNORMAL LOW (ref 11.6–15.9)
IMMATURE RETIC FRACT: 9.7 % (ref 1.60–10.00)
LYMPH#: 1.1 10*3/uL (ref 0.9–3.3)
LYMPH%: 30.1 % (ref 14.0–49.7)
MCH: 27.1 pg (ref 25.1–34.0)
MCHC: 32.3 g/dL (ref 31.5–36.0)
MCV: 83.9 fL (ref 79.5–101.0)
MONO#: 0.3 10*3/uL (ref 0.1–0.9)
MONO%: 8.6 % (ref 0.0–14.0)
NEUT%: 60.8 % (ref 38.4–76.8)
NEUTROS ABS: 2.3 10*3/uL (ref 1.5–6.5)
PLATELETS: 236 10*3/uL (ref 145–400)
RBC: 3.8 10*6/uL (ref 3.70–5.45)
RDW: 16.4 % — ABNORMAL HIGH (ref 11.2–14.5)
RETIC CT ABS: 118.94 10*3/uL — AB (ref 33.70–90.70)
Retic %: 3.13 % — ABNORMAL HIGH (ref 0.70–2.10)
WBC: 3.7 10*3/uL — AB (ref 3.9–10.3)

## 2016-03-12 LAB — COMPREHENSIVE METABOLIC PANEL
ALT: 12 U/L (ref 0–55)
ANION GAP: 12 meq/L — AB (ref 3–11)
AST: 11 U/L (ref 5–34)
Albumin: 2.8 g/dL — ABNORMAL LOW (ref 3.5–5.0)
Alkaline Phosphatase: 84 U/L (ref 40–150)
BUN: <4 mg/dL — ABNORMAL LOW (ref 7.0–26.0)
CHLORIDE: 102 meq/L (ref 98–109)
CO2: 22 meq/L (ref 22–29)
Calcium: 9.3 mg/dL (ref 8.4–10.4)
Creatinine: 0.8 mg/dL (ref 0.6–1.1)
GLUCOSE: 525 mg/dL — AB (ref 70–140)
Potassium: 3.8 mEq/L (ref 3.5–5.1)
SODIUM: 137 meq/L (ref 136–145)
TOTAL PROTEIN: 7.1 g/dL (ref 6.4–8.3)
Total Bilirubin: 0.37 mg/dL (ref 0.20–1.20)

## 2016-03-12 LAB — WHOLE BLOOD GLUCOSE
GLUCOSE: 374 mg/dL — AB (ref 70–100)
Glucose: 350 mg/dL — ABNORMAL HIGH (ref 70–100)
HRS PC: 2.5 h
HRS PC: 2.5 h

## 2016-03-12 LAB — MAGNESIUM: Magnesium: 1.5 mg/dl (ref 1.5–2.5)

## 2016-03-12 MED ORDER — METOPROLOL SUCCINATE ER 25 MG PO TB24
25.0000 mg | ORAL_TABLET | Freq: Once | ORAL | Status: AC
  Administered 2016-03-12: 25 mg via ORAL
  Filled 2016-03-12: qty 1

## 2016-03-12 MED ORDER — DIPHENHYDRAMINE HCL 50 MG/ML IJ SOLN
INTRAMUSCULAR | Status: AC
  Filled 2016-03-12: qty 1

## 2016-03-12 MED ORDER — INSULIN REGULAR HUMAN 100 UNIT/ML IJ SOLN
15.0000 [IU] | Freq: Once | INTRAMUSCULAR | Status: AC
  Administered 2016-03-12: 15 [IU] via SUBCUTANEOUS
  Filled 2016-03-12: qty 0.15

## 2016-03-12 MED ORDER — SODIUM CHLORIDE 0.9 % IV SOLN
Freq: Once | INTRAVENOUS | Status: AC
  Administered 2016-03-12: 09:00:00 via INTRAVENOUS

## 2016-03-12 MED ORDER — PALONOSETRON HCL INJECTION 0.25 MG/5ML
INTRAVENOUS | Status: AC
  Filled 2016-03-12: qty 5

## 2016-03-12 MED ORDER — HEPARIN SOD (PORK) LOCK FLUSH 100 UNIT/ML IV SOLN
250.0000 [IU] | Freq: Once | INTRAVENOUS | Status: AC | PRN
  Administered 2016-03-12: 250 [IU]
  Filled 2016-03-12: qty 5

## 2016-03-12 MED ORDER — PALONOSETRON HCL INJECTION 0.25 MG/5ML
0.2500 mg | Freq: Once | INTRAVENOUS | Status: AC
  Administered 2016-03-12: 0.25 mg via INTRAVENOUS

## 2016-03-12 MED ORDER — SODIUM CHLORIDE 0.9% FLUSH
10.0000 mL | INTRAVENOUS | Status: DC | PRN
  Administered 2016-03-12: 10 mL
  Filled 2016-03-12: qty 10

## 2016-03-12 MED ORDER — INSULIN REGULAR HUMAN 100 UNIT/ML IJ SOLN
20.0000 [IU] | Freq: Once | INTRAMUSCULAR | Status: AC
  Administered 2016-03-12: 20 [IU] via SUBCUTANEOUS
  Filled 2016-03-12: qty 0.2

## 2016-03-12 MED ORDER — DIPHENHYDRAMINE HCL 50 MG/ML IJ SOLN
50.0000 mg | Freq: Once | INTRAMUSCULAR | Status: AC
Start: 2016-03-12 — End: 2016-03-12
  Administered 2016-03-12: 50 mg via INTRAVENOUS

## 2016-03-12 MED ORDER — SODIUM CHLORIDE 0.9 % IV SOLN
750.0000 mg | Freq: Once | INTRAVENOUS | Status: AC
  Administered 2016-03-12: 750 mg via INTRAVENOUS
  Filled 2016-03-12: qty 75

## 2016-03-12 MED ORDER — FAMOTIDINE IN NACL 20-0.9 MG/50ML-% IV SOLN
INTRAVENOUS | Status: AC
  Filled 2016-03-12: qty 50

## 2016-03-12 MED ORDER — DEXAMETHASONE SODIUM PHOSPHATE 100 MG/10ML IJ SOLN
20.0000 mg | Freq: Once | INTRAMUSCULAR | Status: AC
  Administered 2016-03-12: 20 mg via INTRAVENOUS
  Filled 2016-03-12: qty 2

## 2016-03-12 MED ORDER — FAMOTIDINE IN NACL 20-0.9 MG/50ML-% IV SOLN
20.0000 mg | Freq: Once | INTRAVENOUS | Status: AC
  Administered 2016-03-12: 20 mg via INTRAVENOUS

## 2016-03-12 MED ORDER — METOPROLOL SUCCINATE ER 25 MG PO TB24: 25.0000 mg | ORAL_TABLET | Freq: Every day | ORAL | 1 refills | Status: DC

## 2016-03-12 MED ORDER — SODIUM CHLORIDE 0.9 % IV SOLN
175.0000 mg/m2 | Freq: Once | INTRAVENOUS | Status: AC
  Administered 2016-03-12: 432 mg via INTRAVENOUS
  Filled 2016-03-12: qty 72

## 2016-03-12 NOTE — Progress Notes (Signed)
SYMPTOM MANAGEMENT CLINIC    Chief Complaint: Tachycardia, hyperglycemia  HPI:  Marilyn [name] 50 y.o. female diagnosed with lung cancer.  Presented to the [facility] today to receive her first cycle of carboplatin/Taxol chemotherapy regimen.    No history exists.    Review of Systems  Constitutional: Positive for malaise/fatigue.  All other systems reviewed and are negative.   Past Medical History:  Diagnosis Date  . Asthma   . Diabetes mellitus   . Hypercholesteremia   . Hypertension   . Lung cancer ([place])   . Neuropathy ([place])   . Obese     Past Surgical History:  Procedure Laterality Date  . CESAREAN SECTION    . IR GENERIC HISTORICAL  01/08/2016   IR US GUIDE VASC ACCESS RIGHT 01/08/2016 [name], DO [facility]  . IR GENERIC HISTORICAL  01/08/2016   IR FLUORO GUIDE CV LINE RIGHT 01/08/2016 [name], DO [facility]  . TUBAL LIGATION    . US guided core needle biopsy     of right lower neck/supraclavicular lymph nodes    has Chest pain; Headache; Diabetes mellitus ([place]); Mediastinal mass; Non-small cell cancer of right lung ([place]); Adenocarcinoma of left lung, stage 3 ([place]); Tachycardia; Port catheter in place; and Palliative care by specialist on her problem list.    has No Known Allergies.    Medication List       Accurate as of 03/12/16 11:44 AM. Always use your most recent med list.          aspirin EC 81 MG tablet Take 1 tablet (81 mg total) by mouth daily.   chlorhexidine 0.12 % solution Commonly known as:  PERIDEX Use as directed 15 mLs in the mouth or throat 2 (two) times daily.   EQ ALLERGY RELIEF CHILDRENS 12.5 MG/5ML liquid Generic drug:  diphenhydrAMINE   Fluocinonide 0.1 % Crea   HYDROcodone-acetaminophen 7.5-325 mg/15 ml solution Commonly known as:  HYCET Take 15 mLs by mouth 4 (four) times daily as needed for moderate pain.   insulin aspart 100 UNIT/ML FlexPen Commonly known as:  NOVOLOG As per sliding scale  instructions   magic mouthwash w/lidocaine Soln Take 5 mLs by mouth 4 (four) times daily as needed for mouth pain.   magnesium oxide 400 (241.3 Mg) MG tablet Commonly known as:  MAG-OX Take 1 tablet (400 mg total) by mouth 2 (two) times daily.   metoprolol succinate 25 MG 24 hr tablet Commonly known as:  TOPROL-XL Take 1 tablet (25 mg total) by mouth daily.   SONAFINE Apply 1 application topically 2 (two) times daily.   sucralfate 1 g tablet Commonly known as:  CARAFATE Take 1 tablet (1 g total) by mouth 4 (four) times daily -  with meals and at bedtime. 5 min before meals for radiation induced esophagitis   TRESIBA FLEXTOUCH 200 UNIT/ML Sopn Generic drug:  Insulin Degludec Inject 100 Units into the skin daily before breakfast.        PHYSICAL EXAMINATION  Oncology Vitals 03/12/2016 03/12/2016  Height - -  Weight - -  Weight (lbs) - -  BMI (kg/m2) - -  Temp - -  Pulse 149 149  Resp - -  SpO2 100 -  BSA (m2) - -   BP Readings from Last 2 Encounters:  03/12/16 121/89  03/03/16 129/82    Physical Exam  Constitutional: She is oriented to person, place, and time and well-developed, well-nourished, and in no distress.  HENT:  Head:  Normocephalic and atraumatic.  Mouth/Throat: Oropharynx is clear and moist.  Eyes: Conjunctivae and EOM are normal. Pupils are equal, round, and reactive to light. Right eye exhibits no discharge. Left eye exhibits no discharge. No scleral icterus.  Neck: Normal range of motion. Neck supple. No JVD present. No tracheal deviation present. No thyromegaly present.  Cardiovascular: Normal rate, regular rhythm, normal heart sounds and intact distal pulses.   Pulmonary/Chest: Effort normal and breath sounds normal. No respiratory distress. She has no wheezes. She has no rales. She exhibits no tenderness.  Abdominal: Soft. Bowel sounds are normal. She exhibits no distension and no mass. There is no tenderness. There is no rebound and no guarding.    Musculoskeletal: Normal range of motion. She exhibits no edema or tenderness.  Lymphadenopathy:    She has no cervical adenopathy.  Neurological: She is alert and oriented to person, place, and time. Gait normal.  Skin: Skin is warm and dry. No rash noted. No erythema. No pallor.  Psychiatric: Affect normal.  Nursing note and vitals reviewed.   LABORATORY DATA:. Appointment on 03/12/2016  Component Date Value Ref Range Status  . Glucose 03/12/2016 374* 70 - 100 mg/dL Final  . HRS PC 03/12/2016 2.5  Hours Final  Appointment on 03/12/2016  Component Date Value Ref Range Status  . WBC 03/12/2016 3.7* 3.9 - 10.3 10e3/uL Final  . NEUT# 03/12/2016 2.3  1.5 - 6.5 10e3/uL Final  . HGB 03/12/2016 10.3* 11.6 - 15.9 g/dL Final  . HCT 03/12/2016 31.9* 34.8 - 46.6 % Final  . Platelets 03/12/2016 236  145 - 400 10e3/uL Final  . MCV 03/12/2016 83.9  79.5 - 101.0 fL Final  . MCH 03/12/2016 27.1  25.1 - 34.0 pg Final  . MCHC 03/12/2016 32.3  31.5 - 36.0 g/dL Final  . RBC 03/12/2016 3.80  3.70 - 5.45 10e6/uL Final  . RDW 03/12/2016 16.4* 11.2 - 14.5 % Final  . lymph# 03/12/2016 1.1  0.9 - 3.3 10e3/uL Final  . MONO# 03/12/2016 0.3  0.1 - 0.9 10e3/uL Final  . Eosinophils Absolute 03/12/2016 0.0  0.0 - 0.5 10e3/uL Final  . Basophils Absolute 03/12/2016 0.0  0.0 - 0.1 10e3/uL Final  . NEUT% 03/12/2016 60.8  38.4 - 76.8 % Final  . LYMPH% 03/12/2016 30.1  14.0 - 49.7 % Final  . MONO% 03/12/2016 8.6  0.0 - 14.0 % Final  . EOS% 03/12/2016 0.5  0.0 - 7.0 % Final  . BASO% 03/12/2016 0.0  0.0 - 2.0 % Final  . Retic % 03/12/2016 3.13* 0.70 - 2.10 % Final  . Retic Ct Abs 03/12/2016 118.94* 33.70 - 90.70 10e3/uL Final  . Immature Retic Fract 03/12/2016 9.70  1.60 - 10.00 % Final  . Sodium 03/12/2016 137  136 - 145 mEq/L Final  . Potassium 03/12/2016 3.8  3.5 - 5.1 mEq/L Final  . Chloride 03/12/2016 102  98 - 109 mEq/L Final  . CO2 03/12/2016 22  22 - 29 mEq/L Final  . Glucose 03/12/2016 525* 70 - 140  mg/dl Final  . BUN 03/12/2016 <4.0* 7.0 - 26.0 mg/dL Final  . Creatinine 03/12/2016 0.8  0.6 - 1.1 mg/dL Final  . Total Bilirubin 03/12/2016 0.37  0.20 - 1.20 mg/dL Final  . Alkaline Phosphatase 03/12/2016 84  40 - 150 U/L Final  . AST 03/12/2016 11  5 - 34 U/L Final  . ALT 03/12/2016 12  0 - 55 U/L Final  . Total Protein 03/12/2016 7.1  6.4 - 8.3 g/dL  Final  . Albumin 03/12/2016 2.8* 3.5 - 5.0 g/dL Final  . Calcium 03/12/2016 9.3  8.4 - 10.4 mg/dL Final  . Anion Gap 03/12/2016 12* 3 - 11 mEq/L Final  . EGFR 03/12/2016 >90  >90 ml/min/1.73 m2 Final  . Magnesium 03/12/2016 1.5  1.5 - 2.5 mg/dl Final    RADIOGRAPHIC STUDIES: No results found.  ASSESSMENT/PLAN:    Tachycardia Patient has a history of chronic tachycardia; and typically takes metoprolol 25 mg extended release tablets once daily in the morning.  However, patient states that she ran out of the metoprolol proximally 3 days ago and has not been taking.  She arrived at the [facility] today to receive her first cycle of chemotherapy with heart rate between 149 up to 156.  She remained asymptomatic with a tachycardia.  She states her baseline tachycardia rate is around 137.  The cancer Center pharmacy will provide patient the metoprolol 25 mg extended release tablet to take 1 today while at the cancer center.  Also, this provider ordered a refill of the metoprolol for the patient as well.  Patient was advised to make sure she does not run out of her metoprolol as directed in the future.  She was advised to take metoprolol as directed as well.  Patient was advised to obtain a primary care physician for further refills of the metoprolol in the future.  Non-small cell cancer of right lung [facility]) Patient presents to the [facility] today to receive cycle one of her carboplatin/Taxol chemotherapy regimen.  See further notes for details of today's visit.  She is scheduled to return for an injection on 03/15/2016.  She is scheduled  for labs and a follow-up visit on 03/18/2016.  Diabetes mellitus ([place]) Patient also has history of diabetes.  She states that she was in a hurry to get to the [facility] to receive her first cycle of chemotherapy this morning; and did not take either her metoprolol or her diabetic medications.  She did not check her blood sugar; but did eat breakfast this morning.  Initial blood sugar on labs was 525.  Patient received 20 units of regular insulin; and blood sugar decreased down to 374.  However, patient will receive dexamethasone as a premedication prior to her chemotherapy today.  The plan is for the patient to have her blood sugar rechecked later today.  She will also be encouraged to frequent check her blood sugar since she is receiving chemotherapy and taking steroids as part of her chemotherapy plan.  She should also carry her glucometer and her insulin pen with her at all times as well.   Patient stated understanding of all instructions; and was in agreement with this plan of care. The patient knows to call the clinic with any problems, questions or concerns.   Total time spent with patient was  40 minutes;  with greater than 75 percent of that time spent in face to face counseling regarding patient's symptoms,  and coordination of care and follow up.  Disclaimer:This dictation was prepared with Dragon/digital dictation along with [employer]. Any transcriptional errors that result from this process are unintentional.  Drue Second, NP 03/12/2016

## 2016-03-12 NOTE — Assessment & Plan Note (Signed)
Patient presents to the [facility] today to receive cycle one of her carboplatin/Taxol chemotherapy regimen.  See further notes for details of today's visit.  She is scheduled to return for an injection on 03/15/2016.  She is scheduled for labs and a follow-up visit on 03/18/2016.

## 2016-03-12 NOTE — Assessment & Plan Note (Signed)
Patient has a history of chronic tachycardia; and typically takes metoprolol 25 mg extended release tablets once daily in the morning.  However, patient states that she ran out of the metoprolol proximally 3 days ago and has not been taking.  She arrived at the [facility] today to receive her first cycle of chemotherapy with heart rate between 149 up to 156.  She remained asymptomatic with a tachycardia.  She states her baseline tachycardia rate is around 137.  The cancer Center pharmacy will provide patient the metoprolol 25 mg extended release tablet to take 1 today while at the cancer center.  Also, this provider ordered a refill of the metoprolol for the patient as well.  Patient was advised to make sure she does not run out of her metoprolol as directed in the future.  She was advised to take metoprolol as directed as well.  Patient was advised to obtain a primary care physician for further refills of the metoprolol in the future.

## 2016-03-12 NOTE — Progress Notes (Signed)
CBG 523 and HR 149; Dr. Julien Nordmann aware and orders placed for patient to receive 20 units of SQ insulin. CBG to be rechecked after 30 minutes. Patient had not taken metoprolol '25mg'$  in 3 days; Dr. Julien Nordmann and Selena Lesser aware, [facility] will refill prescription.   1025: Selena Lesser, NP at bedside. CBG recheck was 374. Per Selena Lesser, patient to receive metoprolol '25mg'$  and okay to proceed with treatment. CBG to be rechecked before patient is discharged. Selena Lesser to inform Dr. Julien Nordmann of CBG and plan.   CBG rechecked and was 350; heart rate 147. Cyndee Bacon notified and order placed for patient to receive 15 units of insulin. Patient educated to carry insulin pen and glucometer with her at all times. Also educated pt on importance of low carb diet adherence and gave food suggestions and print out. Patient to recheck CBG when she gets home. Patient also educated to pick up metoprolol prescription from pharmacy today so that she can take a dose tomorrow. Patient verbalized understanding.

## 2016-03-12 NOTE — Patient Instructions (Addendum)
[facility] Discharge Instructions for Patients Receiving Chemotherapy  Today you received the following chemotherapy agents: Taxol and Carboplatin  To help prevent nausea and vomiting after your treatment, we encourage you to take your nausea medication as directed.    If you develop nausea and vomiting that is not controlled by your nausea medication, call the clinic.   BELOW ARE SYMPTOMS THAT SHOULD BE REPORTED IMMEDIATELY:  *FEVER GREATER THAN 100.5 F  *CHILLS WITH OR WITHOUT FEVER  NAUSEA AND VOMITING THAT IS NOT CONTROLLED WITH YOUR NAUSEA MEDICATION  *UNUSUAL SHORTNESS OF BREATH  *UNUSUAL BRUISING OR BLEEDING  TENDERNESS IN MOUTH AND THROAT WITH OR WITHOUT PRESENCE OF ULCERS  *URINARY PROBLEMS  *BOWEL PROBLEMS  UNUSUAL RASH Items with * indicate a potential emergency and should be followed up as soon as possible.  Feel free to call the clinic you have any questions or concerns. The clinic phone number is [phone].  Please show the [facility] at check-in to the Emergency Department and triage nurse.  Diabetes Mellitus and Food It is important for you to manage your blood sugar (glucose) level. Your blood glucose level can be greatly affected by what you eat. Eating healthier foods in the appropriate amounts throughout the day at about the same time each day will help you control your blood glucose level. It can also help slow or prevent worsening of your diabetes mellitus. Healthy eating may even help you improve the level of your blood pressure and reach or maintain a healthy weight. General recommendations for healthful eating and cooking habits include:  Eating meals and snacks regularly. Avoid going long periods of time without eating to lose weight.  Eating a diet that consists mainly of plant-based foods, such as fruits, vegetables, nuts, legumes, and whole grains.  Using low-heat cooking methods, such as baking, instead of high-heat  cooking methods, such as deep frying. Work with your dietitian to make sure you understand how to use the Nutrition Facts information on food labels. How can food affect me? Carbohydrates  Carbohydrates affect your blood glucose level more than any other type of food. Your dietitian will help you determine how many carbohydrates to eat at each meal and teach you how to count carbohydrates. Counting carbohydrates is important to keep your blood glucose at a healthy level, especially if you are using insulin or taking certain medicines for diabetes mellitus. Alcohol  Alcohol can cause sudden decreases in blood glucose (hypoglycemia), especially if you use insulin or take certain medicines for diabetes mellitus. Hypoglycemia can be a life-threatening condition. Symptoms of hypoglycemia (sleepiness, dizziness, and disorientation) are similar to symptoms of having too much alcohol. If your health care provider has given you approval to drink alcohol, do so in moderation and use the following guidelines:  Women should not have more than one drink per day, and men should not have more than two drinks per day. One drink is equal to:  12 oz of beer.  5 oz of wine.  1 oz of hard liquor.  Do not drink on an empty stomach.  Keep yourself hydrated. Have water, diet soda, or unsweetened iced tea.  Regular soda, juice, and other mixers might contain a lot of carbohydrates and should be counted. What foods are not recommended? As you make food choices, it is important to remember that all foods are not the same. Some foods have fewer nutrients per serving than other foods, even though they might have the same number of  calories or carbohydrates. It is difficult to get your body what it needs when you eat foods with fewer nutrients. Examples of foods that you should avoid that are high in calories and carbohydrates but low in nutrients include:  Trans fats (most processed foods list trans fats on the  Nutrition Facts label).  Regular soda.  Juice.  Candy.  Sweets, such as cake, pie, doughnuts, and cookies.  Fried foods. What foods can I eat? Eat nutrient-rich foods, which will nourish your body and keep you healthy. The food you should eat also will depend on several factors, including:  The calories you need.  The medicines you take.  Your weight.  Your blood glucose level.  Your blood pressure level.  Your cholesterol level. You should eat a variety of foods, including:  Protein.  Lean cuts of meat.  Proteins low in saturated fats, such as fish, egg whites, and beans. Avoid processed meats.  Fruits and vegetables.  Fruits and vegetables that may help control blood glucose levels, such as apples, mangoes, and yams.  Dairy products.  Choose fat-free or low-fat dairy products, such as milk, yogurt, and cheese.  Grains, bread, pasta, and rice.  Choose whole grain products, such as multigrain bread, whole oats, and brown rice. These foods may help control blood pressure.  Fats.  Foods containing healthful fats, such as nuts, avocado, olive oil, canola oil, and fish. Does everyone with diabetes mellitus have the same meal plan? Because every person with diabetes mellitus is different, there is not one meal plan that works for everyone. It is very important that you meet with a dietitian who will help you create a meal plan that is just right for you. This information is not intended to replace advice given to you by your health care provider. Make sure you discuss any questions you have with your health care provider. Document Released: 12/17/2004 Document Revised: 08/28/2015 Document Reviewed: 02/16/2013 Elsevier Interactive Patient Education  2017 [employer].

## 2016-03-12 NOTE — Assessment & Plan Note (Signed)
Patient also has history of diabetes.  She states that she was in a hurry to get to the [facility] to receive her first cycle of chemotherapy this morning; and did not take either her metoprolol or her diabetic medications.  She did not check her blood sugar; but did eat breakfast this morning.  Initial blood sugar on labs was 525.  Patient received 20 units of regular insulin; and blood sugar decreased down to 374.  However, patient will receive dexamethasone as a premedication prior to her chemotherapy today.  The plan is for the patient to have her blood sugar rechecked later today.  She will also be encouraged to frequent check her blood sugar since she is receiving chemotherapy and taking steroids as part of her chemotherapy plan.  She should also carry her glucometer and her insulin pen with her at all times as well.

## 2016-03-13 ENCOUNTER — Ambulatory Visit: Payer: Self-pay

## 2016-03-15 ENCOUNTER — Ambulatory Visit (HOSPITAL_BASED_OUTPATIENT_CLINIC_OR_DEPARTMENT_OTHER): Payer: Medicaid Other

## 2016-03-15 ENCOUNTER — Other Ambulatory Visit: Payer: Self-pay | Admitting: *Deleted

## 2016-03-15 VITALS — BP 145/89 | HR 100 | Temp 98.4°F | Resp 20

## 2016-03-15 DIAGNOSIS — C3491 Malignant neoplasm of unspecified part of right bronchus or lung: Secondary | ICD-10-CM

## 2016-03-15 DIAGNOSIS — C3492 Malignant neoplasm of unspecified part of left bronchus or lung: Secondary | ICD-10-CM

## 2016-03-15 DIAGNOSIS — C770 Secondary and unspecified malignant neoplasm of lymph nodes of head, face and neck: Secondary | ICD-10-CM

## 2016-03-15 DIAGNOSIS — Z5189 Encounter for other specified aftercare: Secondary | ICD-10-CM | POA: Diagnosis not present

## 2016-03-15 MED ORDER — PEGFILGRASTIM INJECTION 6 MG/0.6ML ~~LOC~~
6.0000 mg | PREFILLED_SYRINGE | Freq: Once | SUBCUTANEOUS | Status: AC
Start: 2016-03-15 — End: 2016-03-15
  Administered 2016-03-15: 6 mg via SUBCUTANEOUS
  Filled 2016-03-15: qty 0.6

## 2016-03-15 NOTE — Patient Instructions (Signed)
Pegfilgrastim injection What is this medicine? PEGFILGRASTIM (PEG fil gra stim) is a long-acting granulocyte colony-stimulating factor that stimulates the growth of neutrophils, a type of white blood cell important in the body's fight against infection. It is used to reduce the incidence of fever and infection in patients with certain types of cancer who are receiving chemotherapy that affects the bone marrow, and to increase survival after being exposed to high doses of radiation. This medicine may be used for other purposes; ask your health care provider or pharmacist if you have questions. COMMON BRAND NAME(S): Neulasta What should I tell my health care provider before I take this medicine? They need to know if you have any of these conditions: -kidney disease -latex allergy -ongoing radiation therapy -sickle cell disease -skin reactions to acrylic adhesives (On-Body Injector only) -an unusual or allergic reaction to pegfilgrastim, filgrastim, other medicines, foods, dyes, or preservatives -pregnant or trying to get pregnant -breast-feeding How should I use this medicine? This medicine is for injection under the skin. If you get this medicine at home, you will be taught how to prepare and give the pre-filled syringe or how to use the On-body Injector. Refer to the patient Instructions for Use for detailed instructions. Use exactly as directed. Take your medicine at regular intervals. Do not take your medicine more often than directed. It is important that you put your used needles and syringes in a special sharps container. Do not put them in a trash can. If you do not have a sharps container, call your pharmacist or healthcare provider to get one. Talk to your pediatrician regarding the use of this medicine in children. While this drug may be prescribed for selected conditions, precautions do apply. Overdosage: If you think you have taken too much of this medicine contact a poison control  center or emergency room at once. NOTE: This medicine is only for you. Do not share this medicine with others. What if I miss a dose? It is important not to miss your dose. Call your doctor or health care professional if you miss your dose. If you miss a dose due to an On-body Injector failure or leakage, a new dose should be administered as soon as possible using a single prefilled syringe for manual use. What may interact with this medicine? Interactions have not been studied. Give your health care provider a list of all the medicines, herbs, non-prescription drugs, or dietary supplements you use. Also tell them if you smoke, drink alcohol, or use illegal drugs. Some items may interact with your medicine. This list may not describe all possible interactions. Give your health care provider a list of all the medicines, herbs, non-prescription drugs, or dietary supplements you use. Also tell them if you smoke, drink alcohol, or use illegal drugs. Some items may interact with your medicine. What should I watch for while using this medicine? You may need blood work done while you are taking this medicine. If you are going to need a MRI, CT scan, or other procedure, tell your doctor that you are using this medicine (On-Body Injector only). What side effects may I notice from receiving this medicine? Side effects that you should report to your doctor or health care professional as soon as possible: -allergic reactions like skin rash, itching or hives, swelling of the face, lips, or tongue -dizziness -fever -pain, redness, or irritation at site where injected -pinpoint red spots on the skin -red or dark-brown urine -shortness of breath or breathing problems -stomach or   side pain, or pain at the shoulder -swelling -tiredness -trouble passing urine or change in the amount of urine Side effects that usually do not require medical attention (report to your doctor or health care professional if they  continue or are bothersome): -bone pain -muscle pain This list may not describe all possible side effects. Call your doctor for medical advice about side effects. You may report side effects to FDA at 1-800-FDA-1088. Where should I keep my medicine? Keep out of the reach of children. Store pre-filled syringes in a refrigerator between 2 and 8 degrees C (36 and 46 degrees F). Do not freeze. Keep in carton to protect from light. Throw away this medicine if it is left out of the refrigerator for more than 48 hours. Throw away any unused medicine after the expiration date. NOTE: This sheet is a summary. It may not cover all possible information. If you have questions about this medicine, talk to your doctor, pharmacist, or health care provider.  2017 Elsevier/Gold Standard (2014-04-11 14:30:14)  

## 2016-03-17 ENCOUNTER — Encounter (HOSPITAL_COMMUNITY): Payer: Self-pay | Admitting: Emergency Medicine

## 2016-03-17 ENCOUNTER — Emergency Department (HOSPITAL_COMMUNITY)
Admission: EM | Admit: 2016-03-17 | Discharge: 2016-03-17 | Disposition: A | Discharge status: Home / Self Care | Payer: Medicaid Other | Attending: Emergency Medicine | Admitting: Emergency Medicine

## 2016-03-17 DIAGNOSIS — Z85118 Personal history of other malignant neoplasm of bronchus and lung: Secondary | ICD-10-CM | POA: Insufficient documentation

## 2016-03-17 DIAGNOSIS — N3000 Acute cystitis without hematuria: Secondary | ICD-10-CM | POA: Insufficient documentation

## 2016-03-17 DIAGNOSIS — E1140 Type 2 diabetes mellitus with diabetic neuropathy, unspecified: Secondary | ICD-10-CM | POA: Insufficient documentation

## 2016-03-17 DIAGNOSIS — J45909 Unspecified asthma, uncomplicated: Secondary | ICD-10-CM | POA: Insufficient documentation

## 2016-03-17 DIAGNOSIS — R110 Nausea: Secondary | ICD-10-CM | POA: Diagnosis present

## 2016-03-17 DIAGNOSIS — Z79899 Other long term (current) drug therapy: Secondary | ICD-10-CM | POA: Diagnosis not present

## 2016-03-17 DIAGNOSIS — I10 Essential (primary) hypertension: Secondary | ICD-10-CM | POA: Diagnosis not present

## 2016-03-17 DIAGNOSIS — Z794 Long term (current) use of insulin: Secondary | ICD-10-CM | POA: Diagnosis not present

## 2016-03-17 DIAGNOSIS — Z7982 Long term (current) use of aspirin: Secondary | ICD-10-CM | POA: Diagnosis not present

## 2016-03-17 LAB — I-STAT CHEM 8, ED
BUN: 8 mg/dL (ref 6–20)
CREATININE: 0.5 mg/dL (ref 0.44–1.00)
Calcium, Ion: 1.07 mmol/L — ABNORMAL LOW (ref 1.15–1.40)
Chloride: 101 mmol/L (ref 101–111)
Glucose, Bld: 257 mg/dL — ABNORMAL HIGH (ref 65–99)
HEMATOCRIT: 32 % — AB (ref 36.0–46.0)
HEMOGLOBIN: 10.9 g/dL — AB (ref 12.0–15.0)
Potassium: 3.3 mmol/L — ABNORMAL LOW (ref 3.5–5.1)
SODIUM: 141 mmol/L (ref 135–145)
TCO2: 26 mmol/L (ref 0–100)

## 2016-03-17 LAB — URINALYSIS, ROUTINE W REFLEX MICROSCOPIC
BILIRUBIN URINE: NEGATIVE
Glucose, UA: >=500 mg/dL — AB
Hgb urine dipstick: NEGATIVE
KETONES UR: 5 mg/dL — AB
Leukocytes, UA: NEGATIVE
Nitrite: POSITIVE — AB
PROTEIN: 30 mg/dL — AB
Specific Gravity, Urine: 1.012 (ref 1.005–1.030)
pH: 7 (ref 5.0–8.0)

## 2016-03-17 LAB — CBC
HCT: 31.5 % — ABNORMAL LOW (ref 36.0–46.0)
HEMOGLOBIN: 10.6 g/dL — AB (ref 12.0–15.0)
MCH: 27.9 pg (ref 26.0–34.0)
MCHC: 33.7 g/dL (ref 30.0–36.0)
MCV: 82.9 fL (ref 78.0–100.0)
Platelets: 209 10*3/uL (ref 150–400)
RBC: 3.8 MIL/uL — AB (ref 3.87–5.11)
RDW: 16.3 % — ABNORMAL HIGH (ref 11.5–15.5)
WBC: 8 10*3/uL (ref 4.0–10.5)

## 2016-03-17 LAB — COMPREHENSIVE METABOLIC PANEL
ALBUMIN: 3.2 g/dL — AB (ref 3.5–5.0)
ALT: 12 U/L — AB (ref 14–54)
ANION GAP: 8 (ref 5–15)
AST: 13 U/L — ABNORMAL LOW (ref 15–41)
Alkaline Phosphatase: 63 U/L (ref 38–126)
BUN: 11 mg/dL (ref 6–20)
CHLORIDE: 104 mmol/L (ref 101–111)
CO2: 27 mmol/L (ref 22–32)
CREATININE: 0.46 mg/dL (ref 0.44–1.00)
Calcium: 7.9 mg/dL — ABNORMAL LOW (ref 8.9–10.3)
GFR calc non Af Amer: >60 mL/min (ref >60)
GLUCOSE: 257 mg/dL — AB (ref 65–99)
Potassium: 3.3 mmol/L — ABNORMAL LOW (ref 3.5–5.1)
SODIUM: 139 mmol/L (ref 135–145)
Total Bilirubin: 1 mg/dL (ref 0.3–1.2)
Total Protein: 6.5 g/dL (ref 6.5–8.1)

## 2016-03-17 LAB — LIPASE, BLOOD: LIPASE: 20 U/L (ref 11–51)

## 2016-03-17 MED ORDER — PROMETHAZINE HCL 25 MG PO TABS: 25.0000 mg | ORAL_TABLET | Freq: Four times a day (QID) | ORAL | 0 refills | Status: DC | PRN

## 2016-03-17 MED ORDER — ONDANSETRON HCL 4 MG/2ML IJ SOLN
4.0000 mg | Freq: Once | INTRAMUSCULAR | Status: AC
  Administered 2016-03-17: 4 mg via INTRAVENOUS
  Filled 2016-03-17: qty 2

## 2016-03-17 MED ORDER — SODIUM CHLORIDE 0.9 % IV BOLUS (SEPSIS)
1000.0000 mL | Freq: Once | INTRAVENOUS | Status: AC
  Administered 2016-03-17: 1000 mL via INTRAVENOUS

## 2016-03-17 MED ORDER — PROMETHAZINE HCL 25 MG/ML IJ SOLN
12.5000 mg | Freq: Once | INTRAMUSCULAR | Status: AC
  Administered 2016-03-17: 12.5 mg via INTRAVENOUS
  Filled 2016-03-17: qty 1

## 2016-03-17 MED ORDER — POTASSIUM CHLORIDE CRYS ER 20 MEQ PO TBCR
20.0000 meq | EXTENDED_RELEASE_TABLET | Freq: Once | ORAL | Status: AC
  Administered 2016-03-17: 20 meq via ORAL
  Filled 2016-03-17: qty 1

## 2016-03-17 MED ORDER — HEPARIN SOD (PORK) LOCK FLUSH 100 UNIT/ML IV SOLN
INTRAVENOUS | Status: AC
  Filled 2016-03-17: qty 5

## 2016-03-17 MED ORDER — CEPHALEXIN 500 MG PO CAPS: 500.0000 mg | ORAL_CAPSULE | Freq: Two times a day (BID) | ORAL | 0 refills | Status: DC

## 2016-03-17 MED ORDER — DEXTROSE 5 % IV SOLN
1.0000 g | Freq: Once | INTRAVENOUS | Status: AC
  Administered 2016-03-17: 1 g via INTRAVENOUS
  Filled 2016-03-17: qty 10

## 2016-03-17 NOTE — ED Provider Notes (Signed)
[facility] DEPT Provider Note   CSN: [id number] Arrival date & time: 03/17/16  5465     History   Chief Complaint Chief Complaint  Patient presents with  . Nausea    HPI Marilyn [name] is a 50 y.o. female.  This 49 year old female with a history of stage III lung cancer who is undergoing her second round of chemotherapy, last dose was on the 11th.  She's not felt well since then with decreased appetite, nausea, no vomiting or diarrhea.  No reported fever or dysuria, but she reports that she is feeling weak from not eating      Past Medical History:  Diagnosis Date  . Asthma   . Diabetes mellitus   . Hypercholesteremia   . Hypertension   . Lung cancer ([place])   . Neuropathy ([place])   . Obese     Patient Active Problem List   Diagnosis Date Noted  . Palliative care by specialist   . Port catheter in place 01/19/2016  . Tachycardia 01/14/2016  . Adenocarcinoma of left lung, stage 3 ([place]) 01/01/2016  . Non-small cell cancer of right lung ([place]) 12/31/2015  . Mediastinal mass   . Chest pain 12/26/2015  . Headache 12/26/2015  . Diabetes mellitus ([place]) 12/26/2015    Past Surgical History:  Procedure Laterality Date  . CESAREAN SECTION    . IR GENERIC HISTORICAL  01/08/2016   IR US GUIDE VASC ACCESS RIGHT 01/08/2016 [name], DO [facility]  . IR GENERIC HISTORICAL  01/08/2016   IR FLUORO GUIDE CV LINE RIGHT 01/08/2016 [name], DO [facility]  . TUBAL LIGATION    . US guided core needle biopsy     of right lower neck/supraclavicular lymph nodes    OB History    No data available       Home Medications    Prior to Admission medications   Medication Sig Start Date End Date Taking? Authorizing Provider  aspirin EC 81 MG tablet Take 1 tablet (81 mg total) by mouth daily. 01/16/16  Yes [name], MD  chlorhexidine (PERIDEX) 0.12 % solution Use as directed 15 mLs in the mouth or throat 2 (two) times daily. 01/05/16  Yes Cadott,  MD  EQ ALLERGY RELIEF CHILDRENS 12.5 MG/5ML liquid Take 12.5 mg by mouth daily as needed for allergies.  01/27/16  Yes Historical Provider, MD  Fluocinonide 0.1 % CREA Apply 1 application topically daily.  01/06/16  Yes Historical Provider, MD  HYDROcodone-acetaminophen (HYCET) 7.5-325 mg/15 ml solution Take 15 mLs by mouth 4 (four) times daily as needed for moderate pain. 02/06/16 02/05/17 Yes Gautam Juleen China, MD  insulin aspart (NOVOLOG) 100 UNIT/ML FlexPen As per sliding scale instructions 01/16/16  Yes [name], MD  Insulin Degludec (TRESIBA FLEXTOUCH) 200 UNIT/ML SOPN Inject 100 Units into the skin daily before breakfast.    Yes Historical Provider, MD  magic mouthwash w/lidocaine SOLN Take 5 mLs by mouth 4 (four) times daily as needed for mouth pain. 01/23/16  Yes [name], MD  metoprolol succinate (TOPROL-XL) 25 MG 24 hr tablet Take 1 tablet (25 mg total) by mouth daily. 03/12/16  Yes Susanne Borders, NP  sucralfate (CARAFATE) 1 g tablet Take 1 tablet (1 g total) by mouth 4 (four) times daily -  with meals and at bedtime. 5 min before meals for radiation induced esophagitis 01/22/16  Yes Tyler Pita, MD  Wound Dressings (SONAFINE) Apply 1 application topically 2 (two) times daily.  Yes Historical Provider, MD  cephALEXin (KEFLEX) 500 MG capsule Take 1 capsule (500 mg total) by mouth 2 (two) times daily. 03/17/16   Nicole Pisciotta, PA-C  promethazine (PHENERGAN) 25 MG tablet Take 1 tablet (25 mg total) by mouth every 6 (six) hours as needed for nausea or vomiting. 03/17/16   Monico Blitz, PA-C    Family History Family History  Problem Relation Age of Onset  . Cancer Neg Hx   . Rheumatologic disease Neg Hx     Social History Social History  Substance Use Topics  . Smoking status: Never Smoker  . Smokeless tobacco: Never Used  . Alcohol use No     Allergies   Patient has no known allergies.   Review of Systems Review of Systems  Constitutional:  Positive for appetite change. Negative for fever.  Respiratory: Negative for shortness of breath.   Cardiovascular: Negative for chest pain.  Gastrointestinal: Negative for abdominal pain.  Genitourinary: Negative for dysuria.  Musculoskeletal: Negative for arthralgias and myalgias.  Neurological: Positive for weakness.  All other systems reviewed and are negative.    Physical Exam Updated Vital Signs BP 138/78 (BP Location: Left Arm)   Pulse 114   Temp 98.6 F (37 C) (Oral)   Resp 18   SpO2 98%   Physical Exam  Constitutional: She is oriented to person, place, and time. She appears well-developed and well-nourished. No distress.  Eyes: Pupils are equal, round, and reactive to light.  Neck: Normal range of motion.  Cardiovascular: Normal rate and regular rhythm.   Pulmonary/Chest: Effort normal and breath sounds normal.  Abdominal: Soft.  Musculoskeletal: Normal range of motion.  Neurological: She is alert and oriented to person, place, and time.  Skin: Skin is warm and dry.  Psychiatric: She has a normal mood and affect.  Nursing note and vitals reviewed.    ED Treatments / Results  Labs (all labs ordered are listed, but only abnormal results are displayed) Labs Reviewed  COMPREHENSIVE METABOLIC PANEL - Abnormal; Notable for the following:       Result Value   Potassium 3.3 (*)    Glucose, Bld 257 (*)    Calcium 7.9 (*)    Albumin 3.2 (*)    AST 13 (*)    ALT 12 (*)    All other components within normal limits  CBC - Abnormal; Notable for the following:    RBC 3.80 (*)    Hemoglobin 10.6 (*)    HCT 31.5 (*)    RDW 16.3 (*)    All other components within normal limits  URINALYSIS, ROUTINE W REFLEX MICROSCOPIC - Abnormal; Notable for the following:    APPearance HAZY (*)    Glucose, UA >=500 (*)    Ketones, ur 5 (*)    Protein, ur 30 (*)    Nitrite POSITIVE (*)    Bacteria, UA MANY (*)    Squamous Epithelial / LPF 0-5 (*)    All other components within  normal limits  I-STAT CHEM 8, ED - Abnormal; Notable for the following:    Potassium 3.3 (*)    Glucose, Bld 257 (*)    Calcium, Ion 1.07 (*)    Hemoglobin 10.9 (*)    HCT 32.0 (*)    All other components within normal limits  URINE CULTURE  LIPASE, BLOOD    EKG  EKG Interpretation None       Radiology No results found.  Procedures Procedures (including critical care time)  Medications Ordered  in ED Medications  sodium chloride 0.9 % bolus 1,000 mL (0 mLs Intravenous Stopped 03/17/16 0559)  ondansetron (ZOFRAN) injection 4 mg (4 mg Intravenous Given 03/17/16 0446)  sodium chloride 0.9 % bolus 1,000 mL (0 mLs Intravenous Stopped 03/17/16 0730)  potassium chloride SA (K-DUR,KLOR-CON) CR tablet 20 mEq (20 mEq Oral Given 03/17/16 0739)  cefTRIAXone (ROCEPHIN) 1 g in dextrose 5 % 50 mL IVPB (0 g Intravenous Stopped 03/17/16 0811)  promethazine (PHENERGAN) injection 12.5 mg (12.5 mg Intravenous Given 03/17/16 0815)     Initial Impression / Assessment and Plan / ED Course  I have reviewed the triage vital signs and the nursing notes.  Pertinent labs & imaging results that were available during my care of the patient were reviewed by me and considered in my medical decision making (see chart for details).  Clinical Course      We'll obtain labs, hydrate and reevaluate.  Also give patient antiemetics She has received 1 L of fluid and 4 mg of Zofran IV and is feeling better.  Labs have been reviewed.  She has a slightly decreased potassium level.  White count is up to 8.0.  Final Clinical Impressions(s) / ED Diagnoses   Final diagnoses:  Acute cystitis without hematuria  Nausea    New Prescriptions Discharge Medication List as of 03/17/2016  9:29 AM    START taking these medications   Details  cephALEXin (KEFLEX) 500 MG capsule Take 1 capsule (500 mg total) by mouth 2 (two) times daily., Starting Wed 03/17/2016, Print    promethazine (PHENERGAN) 25 MG tablet Take 1  tablet (25 mg total) by mouth every 6 (six) hours as needed for nausea or vomiting., Starting Wed 03/17/2016, Print         Junius Creamer, NP 03/17/16 [place], NP 03/17/16 [place], MD 03/18/16 [phone]

## 2016-03-17 NOTE — Discharge Instructions (Signed)
Please follow with your primary care doctor in the next 2 days for a check-up. They must obtain records for further management.  ° °Do not hesitate to return to the Emergency Department for any new, worsening or concerning symptoms.  ° °

## 2016-03-17 NOTE — ED Provider Notes (Signed)
PROGRESS NOTE                                                                                                                 This is a sign-out from NP Delena Bali at shift change: Marilyn [name] is a 50 y.o. female with past medical history significant for stage III lung cancer (last chemotherapy approximately 5 days ago), chronic tachycardia presenting with nausea, and decreased by mouth intake. Plan is to follow-up urinalysis, by mouth challenge and consider repletion of potassium. Please refer to previous note for full HPI, ROS, PMH and PE.   Urinalysis with nitrite and many bacteria 6-30 whites. Patient reports that she has not had any urinary frequency, dysuria however she does state that her urine has been darker than normal, she also denies any abnormal vaginal discharge. Reports that the nausea is significantly improved with Phenergan. Advised her to push fluids, urine culture pending, patient given Rocephin in the ED and will discharge home with Keflex and Phenergan.Passed PO Challenge.   Vitals:   03/17/16 0401 03/17/16 0643 03/17/16 0907 03/17/16 0953  BP: 143/94 126/65 130/72 138/78  Pulse: (!) 124 106 109 114  Resp: '17 18 20 18  '$ Temp: 98.3 F (36.8 C) 98.6 F (37 C)    TempSrc: Oral Oral    SpO2: 99% 97% 98% 98%    Medications  heparin lock flush 100 UNIT/ML injection (not administered)  sodium chloride 0.9 % bolus 1,000 mL (0 mLs Intravenous Stopped 03/17/16 0559)  ondansetron (ZOFRAN) injection 4 mg (4 mg Intravenous Given 03/17/16 0446)  sodium chloride 0.9 % bolus 1,000 mL (0 mLs Intravenous Stopped 03/17/16 0730)  potassium chloride SA (K-DUR,KLOR-CON) CR tablet 20 mEq (20 mEq Oral Given 03/17/16 0739)  cefTRIAXone (ROCEPHIN) 1 g in dextrose 5 % 50 mL IVPB (0 g Intravenous Stopped 03/17/16 0811)  promethazine (PHENERGAN) injection 12.5 mg (12.5 mg Intravenous Given 03/17/16 0815)           Monico Blitz, PA-C 03/17/16 1044    Margette Fast, MD 03/17/16  1534

## 2016-03-17 NOTE — ED Triage Notes (Signed)
Pt c/o nausea since 1am; pt received chemo on Friday and an injection on Monday; pt endorses decreased appetite and poor oral nutrition; lung cancer stage 3

## 2016-03-18 ENCOUNTER — Other Ambulatory Visit: Payer: Self-pay | Admitting: *Deleted

## 2016-03-18 ENCOUNTER — Other Ambulatory Visit (HOSPITAL_BASED_OUTPATIENT_CLINIC_OR_DEPARTMENT_OTHER): Payer: Medicaid Other

## 2016-03-18 ENCOUNTER — Ambulatory Visit (HOSPITAL_BASED_OUTPATIENT_CLINIC_OR_DEPARTMENT_OTHER): Payer: Medicaid Other | Admitting: Hematology

## 2016-03-18 ENCOUNTER — Encounter: Payer: Self-pay | Admitting: Hematology

## 2016-03-18 VITALS — BP 146/90 | HR 131 | Temp 98.5°F | Resp 18 | Ht 69.0 in | Wt 264.7 lb

## 2016-03-18 DIAGNOSIS — C770 Secondary and unspecified malignant neoplasm of lymph nodes of head, face and neck: Secondary | ICD-10-CM | POA: Diagnosis not present

## 2016-03-18 DIAGNOSIS — R5383 Other fatigue: Secondary | ICD-10-CM

## 2016-03-18 DIAGNOSIS — R000 Tachycardia, unspecified: Secondary | ICD-10-CM | POA: Diagnosis not present

## 2016-03-18 DIAGNOSIS — R630 Anorexia: Secondary | ICD-10-CM | POA: Diagnosis not present

## 2016-03-18 DIAGNOSIS — E119 Type 2 diabetes mellitus without complications: Secondary | ICD-10-CM

## 2016-03-18 DIAGNOSIS — B370 Candidal stomatitis: Secondary | ICD-10-CM

## 2016-03-18 DIAGNOSIS — Z95828 Presence of other vascular implants and grafts: Secondary | ICD-10-CM

## 2016-03-18 DIAGNOSIS — G893 Neoplasm related pain (acute) (chronic): Secondary | ICD-10-CM | POA: Diagnosis not present

## 2016-03-18 DIAGNOSIS — E279 Disorder of adrenal gland, unspecified: Secondary | ICD-10-CM | POA: Diagnosis not present

## 2016-03-18 DIAGNOSIS — K208 Other esophagitis: Secondary | ICD-10-CM | POA: Diagnosis not present

## 2016-03-18 DIAGNOSIS — I10 Essential (primary) hypertension: Secondary | ICD-10-CM

## 2016-03-18 DIAGNOSIS — C3491 Malignant neoplasm of unspecified part of right bronchus or lung: Secondary | ICD-10-CM

## 2016-03-18 DIAGNOSIS — C3492 Malignant neoplasm of unspecified part of left bronchus or lung: Secondary | ICD-10-CM

## 2016-03-18 LAB — COMPREHENSIVE METABOLIC PANEL
ALBUMIN: 3.1 g/dL — AB (ref 3.5–5.0)
ALT: 13 U/L (ref 0–55)
ANION GAP: 10 meq/L (ref 3–11)
AST: 11 U/L (ref 5–34)
Alkaline Phosphatase: 102 U/L (ref 40–150)
BUN: 7.3 mg/dL (ref 7.0–26.0)
CALCIUM: 8.7 mg/dL (ref 8.4–10.4)
CHLORIDE: 103 meq/L (ref 98–109)
CO2: 24 mEq/L (ref 22–29)
CREATININE: 0.7 mg/dL (ref 0.6–1.1)
EGFR: >90 mL/min/{1.73_m2} (ref >90)
Glucose: 238 mg/dl — ABNORMAL HIGH (ref 70–140)
POTASSIUM: 3.4 meq/L — AB (ref 3.5–5.1)
Sodium: 137 mEq/L (ref 136–145)
Total Bilirubin: 0.64 mg/dL (ref 0.20–1.20)
Total Protein: 7.1 g/dL (ref 6.4–8.3)

## 2016-03-18 LAB — CBC WITH DIFFERENTIAL/PLATELET
BASO%: 0.1 % (ref 0.0–2.0)
BASOS ABS: 0 10*3/uL (ref 0.0–0.1)
EOS ABS: 0.1 10*3/uL (ref 0.0–0.5)
EOS%: 0.8 % (ref 0.0–7.0)
HEMATOCRIT: 33.6 % — AB (ref 34.8–46.6)
HGB: 11.2 g/dL — ABNORMAL LOW (ref 11.6–15.9)
LYMPH#: 1.1 10*3/uL (ref 0.9–3.3)
LYMPH%: 15.1 % (ref 14.0–49.7)
MCH: 27.3 pg (ref 25.1–34.0)
MCHC: 33.3 g/dL (ref 31.5–36.0)
MCV: 82 fL (ref 79.5–101.0)
MONO#: 0.3 10*3/uL (ref 0.1–0.9)
MONO%: 4.6 % (ref 0.0–14.0)
NEUT#: 5.7 10*3/uL (ref 1.5–6.5)
NEUT%: 79.4 % — AB (ref 38.4–76.8)
PLATELETS: 195 10*3/uL (ref 145–400)
RBC: 4.1 10*6/uL (ref 3.70–5.45)
RDW: 16.1 % — ABNORMAL HIGH (ref 11.2–14.5)
WBC: 7.2 10*3/uL (ref 3.9–10.3)

## 2016-03-18 LAB — URINE CULTURE

## 2016-03-18 MED ORDER — NYSTATIN 100000 UNIT/ML MT SUSP: 5.0000 mL | Freq: Four times a day (QID) | OROMUCOSAL | 0 refills | Status: DC

## 2016-03-18 MED ORDER — CEPHALEXIN 500 MG PO CAPS: 500.0000 mg | ORAL_CAPSULE | Freq: Two times a day (BID) | ORAL | 0 refills | Status: DC

## 2016-03-18 MED ORDER — HEPARIN SOD (PORK) LOCK FLUSH 100 UNIT/ML IV SOLN
500.0000 [IU] | Freq: Once | INTRAVENOUS | Status: AC | PRN
  Administered 2016-03-18: 500 [IU] via INTRAVENOUS
  Filled 2016-03-18: qty 5

## 2016-03-18 MED ORDER — SODIUM CHLORIDE 0.9 % IJ SOLN
10.0000 mL | INTRAMUSCULAR | Status: DC | PRN
  Administered 2016-03-18: 10 mL via INTRAVENOUS
  Filled 2016-03-18: qty 10

## 2016-03-24 NOTE — Progress Notes (Signed)
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   Weight changes, if any: Wt Readings from Last 3 Encounters:  03/30/16 269 lb 12.8 oz (122.4 kg)  03/18/16 264 lb 11.2 oz (120.1 kg)  03/03/16 275 lb 9.6 oz (125 kg)   Respiratory complaints, if any: SOB, coughing non productive Hemoptysis, if any: None Swallowing Problems/Pain/Difficulty swallowing:Feels like a lump in her throat Smoking Tobacco/Marijuana/Snuff/ETOH use: Never a smoker no drug or alcohol usuage Appetite :Fair picking up some over the past few days Pain: None When is next chemo scheduled?:Cacarboplatin/Taxol rboplatin/Taxol  Lab work from of chart:03-18-16 CBC wdiff, Cmet Recent ER visit nausea post chemotherapy, every 3 weeks 04-01-16 next treatment BP (!) 144/94   Pulse (!) 120   Temp 98.2 F (36.8 C) (Oral)   Resp 18   Ht '5\' 9"'$  (1.753 m)   Wt 269 lb 12.8 oz (122.4 kg)   SpO2 100%   BMI 39.84 kg/m

## 2016-03-26 ENCOUNTER — Ambulatory Visit (HOSPITAL_BASED_OUTPATIENT_CLINIC_OR_DEPARTMENT_OTHER): Payer: Medicaid Other

## 2016-03-26 DIAGNOSIS — Z95828 Presence of other vascular implants and grafts: Secondary | ICD-10-CM

## 2016-03-26 DIAGNOSIS — C3491 Malignant neoplasm of unspecified part of right bronchus or lung: Secondary | ICD-10-CM | POA: Diagnosis not present

## 2016-03-26 DIAGNOSIS — Z452 Encounter for adjustment and management of vascular access device: Secondary | ICD-10-CM

## 2016-03-26 MED ORDER — SODIUM CHLORIDE 0.9 % IJ SOLN
10.0000 mL | INTRAMUSCULAR | Status: DC | PRN
  Administered 2016-03-26: 10 mL via INTRAVENOUS
  Filled 2016-03-26: qty 10

## 2016-03-26 MED ORDER — HEPARIN SOD (PORK) LOCK FLUSH 100 UNIT/ML IV SOLN
500.0000 [IU] | Freq: Once | INTRAVENOUS | Status: AC | PRN
  Administered 2016-03-26: 250 [IU] via INTRAVENOUS
  Filled 2016-03-26: qty 5

## 2016-03-30 ENCOUNTER — Ambulatory Visit
Admission: RE | Admit: 2016-03-30 | Discharge: 2016-03-30 | Disposition: A | Discharge status: Home / Self Care | Payer: Medicaid Other | Source: Ambulatory Visit | Attending: Radiation Oncology | Admitting: Radiation Oncology

## 2016-03-30 ENCOUNTER — Encounter: Payer: Self-pay | Admitting: Radiation Oncology

## 2016-03-30 VITALS — BP 144/94 | HR 120 | Temp 98.2°F | Resp 18 | Ht 69.0 in | Wt 269.8 lb

## 2016-03-30 DIAGNOSIS — Z794 Long term (current) use of insulin: Secondary | ICD-10-CM | POA: Insufficient documentation

## 2016-03-30 DIAGNOSIS — R000 Tachycardia, unspecified: Secondary | ICD-10-CM | POA: Insufficient documentation

## 2016-03-30 DIAGNOSIS — Z7982 Long term (current) use of aspirin: Secondary | ICD-10-CM | POA: Insufficient documentation

## 2016-03-30 DIAGNOSIS — I871 Compression of vein: Secondary | ICD-10-CM | POA: Diagnosis not present

## 2016-03-30 DIAGNOSIS — C3492 Malignant neoplasm of unspecified part of left bronchus or lung: Secondary | ICD-10-CM

## 2016-03-30 DIAGNOSIS — R5383 Other fatigue: Secondary | ICD-10-CM | POA: Diagnosis not present

## 2016-03-30 DIAGNOSIS — Z923 Personal history of irradiation: Secondary | ICD-10-CM | POA: Diagnosis not present

## 2016-03-30 DIAGNOSIS — C3491 Malignant neoplasm of unspecified part of right bronchus or lung: Secondary | ICD-10-CM | POA: Diagnosis not present

## 2016-03-30 DIAGNOSIS — Z79899 Other long term (current) drug therapy: Secondary | ICD-10-CM | POA: Insufficient documentation

## 2016-03-30 NOTE — Progress Notes (Signed)
Radiation Oncology         [phone] ________________________________  Name: Marilyn [name] MRN: [id number]  Date: 03/30/2016  DOB: 1965/10/15  Post Treatment Note  CC: Philis Fendt, MD  Nolene Ebbs, MD  Diagnosis:   50 y.o. 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  Interval Since Last Radiation:  6 weeks   01/05/16 - 02/13/16: The right lung was treated with 60 Gy in 30 fractions.   Narrative:  The patient returns today for routine follow-up. The patient tolerated radiotherapy well. She did experience some dysphagia and weight loss secondary to this. Her fatigue has been noticeable, and she continues on the stomach therapy with Dr. Irene Limbo.                      On review of systems, the patient states she is feeling quite well overall. She does still can have some fatigue, and her appetite is improving. She does occasionally still feel a sense of a lump in her throat when she swallows after eating. She is hopeful that her next chemotherapy regimen will be her last, and she anticipates a CT scan after this period of time. She denies any hemoptysis, edema of her extremities, or dilation of the veins in her chest wall. She denies fevers or chills or productive mucus. She denies any shortness of breath at rest. No other complaints or verbalized.  ALLERGIES:  has No Known Allergies.  Meds: Current Outpatient Prescriptions  Medication Sig Dispense Refill  . aspirin EC 81 MG tablet Take 1 tablet (81 mg total) by mouth daily. 60 tablet 2  . cephALEXin (KEFLEX) 500 MG capsule Take 1 capsule (500 mg total) by mouth 2 (two) times daily. 20 capsule 0  . chlorhexidine (PERIDEX) 0.12 % solution Use as directed 15 mLs in the mouth or throat 2 (two) times daily. 120 mL 0  . Fluocinonide 0.1 % CREA Apply 1 application topically daily.     [name] HYDROcodone-acetaminophen (HYCET) 7.5-325 mg/15 ml solution Take 15 mLs by mouth 4 (four) times daily as  needed for moderate pain. 473 mL 0  . insulin aspart (NOVOLOG) 100 UNIT/ML FlexPen As per sliding scale instructions 15 mL 2  . Insulin Degludec (TRESIBA FLEXTOUCH) 200 UNIT/ML SOPN Inject 100 Units into the skin daily before breakfast.     . magic mouthwash w/lidocaine SOLN Take 5 mLs by mouth 4 (four) times daily as needed for mouth pain. 400 mL 1  . metoprolol succinate (TOPROL-XL) 25 MG 24 hr tablet Take 1 tablet (25 mg total) by mouth daily. 30 tablet 1  . nystatin (MYCOSTATIN) 100000 UNIT/ML suspension Take 5 mLs (500,000 Units total) by mouth 4 (four) times daily. 200 mL 0  . promethazine (PHENERGAN) 25 MG tablet Take 1 tablet (25 mg total) by mouth every 6 (six) hours as needed for nausea or vomiting. 12 tablet 0  . sucralfate (CARAFATE) 1 g tablet Take 1 tablet (1 g total) by mouth 4 (four) times daily -  with meals and at bedtime. 5 min before meals for radiation induced esophagitis 120 tablet 2  . Wound Dressings (SONAFINE) Apply 1 application topically 2 (two) times daily.    Noelle Penner ALLERGY RELIEF CHILDRENS 12.5 MG/5ML liquid Take 12.5 mg by mouth daily as needed for allergies.      No current facility-administered medications for this encounter.    Facility-Administered Medications Ordered in Other Encounters  Medication Dose  Route Frequency Provider Last Rate Last Dose  . sodium chloride 0.9 % injection 10 mL  10 mL Intravenous PRN [name], MD   10 mL at 03/18/16 1335    Physical Findings:  height is '5\' 9"'$  (1.753 m) and weight is 269 lb 12.8 oz (122.4 kg). Her oral temperature is 98.2 F (36.8 C). Her blood pressure is 144/94 (abnormal) and her pulse is 120 (abnormal). Her respiration is 18 and oxygen saturation is 100%.  In general this is a well appearing African American femalein no acute distress. She's alert and oriented x4 and appropriate throughout the examination. Cardiopulmonary assessment is negative for acute distress with RRR, no clicks, rubs, or murmurs, and  she exhibits normal pulmonary effort with normal breath sounds bilaterally.   Lab Findings: Lab Results  Component Value Date   WBC 7.2 03/18/2016   HGB 11.2 (L) 03/18/2016   HCT 33.6 (L) 03/18/2016   MCV 82.0 03/18/2016   PLT 195 03/18/2016     Radiographic Findings: No results found.  Impression/Plan: 1. 50 y.o. 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 The patient has done quite well since completing radiotherapy, and continues to note improvement in her breathing. She states that she is trying to increase her oral intake and still feels like there is at times in her throat but this is improving. She states that she continues on systemic therapy with her next treatment being Thursday of this week. She is not sure what to do about whether or not she should come in for another flush of her PICC line. 2. Tachycardia. Again the source of this is not quite clear, initially this was most consistent with her disease, and although this could still be the case, the patient remains asymptomatic but long-term consequences, R that this is not something that should just be left alone. Again this is probably most consistent with her disease, but she is being set up with primary care provider to evaluate this as well. The patient understands to be evaluated in urgent setting if she becomes symptomatic. 3. Financial assistance. The patient is interested in assistance for awaiting, and I will contact the social workers to contact her about this.     Carola Rhine, PAC

## 2016-03-31 ENCOUNTER — Encounter: Payer: Self-pay | Admitting: Pharmacist

## 2016-04-01 ENCOUNTER — Telehealth: Payer: Self-pay | Admitting: General Practice

## 2016-04-01 ENCOUNTER — Other Ambulatory Visit: Payer: Self-pay | Admitting: *Deleted

## 2016-04-01 ENCOUNTER — Encounter: Payer: Self-pay | Admitting: *Deleted

## 2016-04-01 ENCOUNTER — Ambulatory Visit (HOSPITAL_BASED_OUTPATIENT_CLINIC_OR_DEPARTMENT_OTHER): Payer: Medicaid Other

## 2016-04-01 VITALS — BP 125/88 | HR 101 | Temp 98.6°F | Resp 18

## 2016-04-01 DIAGNOSIS — C3492 Malignant neoplasm of unspecified part of left bronchus or lung: Secondary | ICD-10-CM

## 2016-04-01 DIAGNOSIS — Z5111 Encounter for antineoplastic chemotherapy: Secondary | ICD-10-CM | POA: Diagnosis not present

## 2016-04-01 LAB — COMPREHENSIVE METABOLIC PANEL
ALT: 11 U/L (ref 0–55)
AST: 14 U/L (ref 5–34)
Albumin: 2.8 g/dL — ABNORMAL LOW (ref 3.5–5.0)
Alkaline Phosphatase: 86 U/L (ref 40–150)
Anion Gap: 11 mEq/L (ref 3–11)
BUN: <4 mg/dL — ABNORMAL LOW (ref 7.0–26.0)
CO2: 26 meq/L (ref 22–29)
Calcium: 8.2 mg/dL — ABNORMAL LOW (ref 8.4–10.4)
Chloride: 105 mEq/L (ref 98–109)
Creatinine: 0.6 mg/dL (ref 0.6–1.1)
GLUCOSE: 137 mg/dL (ref 70–140)
POTASSIUM: 3.6 meq/L (ref 3.5–5.1)
SODIUM: 142 meq/L (ref 136–145)
Total Bilirubin: 0.41 mg/dL (ref 0.20–1.20)
Total Protein: 6.5 g/dL (ref 6.4–8.3)

## 2016-04-01 LAB — CBC WITH DIFFERENTIAL/PLATELET
BASO%: 0 % (ref 0.0–2.0)
BASOS ABS: 0 10*3/uL (ref 0.0–0.1)
EOS ABS: 0 10*3/uL (ref 0.0–0.5)
EOS%: 0.2 % (ref 0.0–7.0)
HCT: 27.5 % — ABNORMAL LOW (ref 34.8–46.6)
HGB: 9.1 g/dL — ABNORMAL LOW (ref 11.6–15.9)
LYMPH%: 19.3 % (ref 14.0–49.7)
MCH: 27.6 pg (ref 25.1–34.0)
MCHC: 33.1 g/dL (ref 31.5–36.0)
MCV: 83.3 fL (ref 79.5–101.0)
MONO#: 0.5 10*3/uL (ref 0.1–0.9)
MONO%: 12.5 % (ref 0.0–14.0)
NEUT%: 68 % (ref 38.4–76.8)
NEUTROS ABS: 2.8 10*3/uL (ref 1.5–6.5)
Platelets: 196 10*3/uL (ref 145–400)
RBC: 3.3 10*6/uL — AB (ref 3.70–5.45)
RDW: 15.7 % — ABNORMAL HIGH (ref 11.2–14.5)
WBC: 4.1 10*3/uL (ref 3.9–10.3)
lymph#: 0.8 10*3/uL — ABNORMAL LOW (ref 0.9–3.3)

## 2016-04-01 MED ORDER — PALONOSETRON HCL INJECTION 0.25 MG/5ML
0.2500 mg | Freq: Once | INTRAVENOUS | Status: AC
  Administered 2016-04-01: 0.25 mg via INTRAVENOUS

## 2016-04-01 MED ORDER — HEPARIN SOD (PORK) LOCK FLUSH 100 UNIT/ML IV SOLN
250.0000 [IU] | Freq: Once | INTRAVENOUS | Status: DC | PRN
  Filled 2016-04-01: qty 5

## 2016-04-01 MED ORDER — FAMOTIDINE IN NACL 20-0.9 MG/50ML-% IV SOLN
20.0000 mg | Freq: Once | INTRAVENOUS | Status: AC
  Administered 2016-04-01: 20 mg via INTRAVENOUS

## 2016-04-01 MED ORDER — PALONOSETRON HCL INJECTION 0.25 MG/5ML
INTRAVENOUS | Status: AC
  Filled 2016-04-01: qty 5

## 2016-04-01 MED ORDER — SODIUM CHLORIDE 0.9% FLUSH
3.0000 mL | INTRAVENOUS | Status: DC | PRN
  Filled 2016-04-01: qty 10

## 2016-04-01 MED ORDER — DIPHENHYDRAMINE HCL 50 MG/ML IJ SOLN
INTRAMUSCULAR | Status: AC
Start: 2016-04-01 — End: 2016-04-01
  Filled 2016-04-01: qty 1

## 2016-04-01 MED ORDER — DEXAMETHASONE SODIUM PHOSPHATE 100 MG/10ML IJ SOLN
20.0000 mg | Freq: Once | INTRAMUSCULAR | Status: AC
  Administered 2016-04-01: 20 mg via INTRAVENOUS
  Filled 2016-04-01: qty 2

## 2016-04-01 MED ORDER — SODIUM CHLORIDE 0.9 % IV SOLN
Freq: Once | INTRAVENOUS | Status: AC
  Administered 2016-04-01: 12:00:00 via INTRAVENOUS

## 2016-04-01 MED ORDER — DIPHENHYDRAMINE HCL 50 MG/ML IJ SOLN
50.0000 mg | Freq: Once | INTRAMUSCULAR | Status: AC
  Administered 2016-04-01: 50 mg via INTRAVENOUS

## 2016-04-01 MED ORDER — SODIUM CHLORIDE 0.9 % IV SOLN
750.0000 mg | Freq: Once | INTRAVENOUS | Status: AC
  Administered 2016-04-01: 750 mg via INTRAVENOUS
  Filled 2016-04-01: qty 75

## 2016-04-01 MED ORDER — FAMOTIDINE IN NACL 20-0.9 MG/50ML-% IV SOLN
INTRAVENOUS | Status: AC
  Filled 2016-04-01: qty 50

## 2016-04-01 MED ORDER — SODIUM CHLORIDE 0.9 % IV SOLN
175.0000 mg/m2 | Freq: Once | INTRAVENOUS | Status: AC
  Administered 2016-04-01: 432 mg via INTRAVENOUS
  Filled 2016-04-01: qty 72

## 2016-04-01 NOTE — Patient Instructions (Addendum)
[facility] Discharge Instructions for Patients Receiving Chemotherapy  Today you received the following chemotherapy agents Taxol and Carboplatin. To help prevent nausea and vomiting after your treatment, we encourage you to take your nausea medication as directed.  If you develop nausea and vomiting that is not controlled by your nausea medication, call the clinic.   BELOW ARE SYMPTOMS THAT SHOULD BE REPORTED IMMEDIATELY:  *FEVER GREATER THAN 100.5 F  *CHILLS WITH OR WITHOUT FEVER  NAUSEA AND VOMITING THAT IS NOT CONTROLLED WITH YOUR NAUSEA MEDICATION  *UNUSUAL SHORTNESS OF BREATH  *UNUSUAL BRUISING OR BLEEDING  TENDERNESS IN MOUTH AND THROAT WITH OR WITHOUT PRESENCE OF ULCERS  *URINARY PROBLEMS  *BOWEL PROBLEMS  UNUSUAL RASH Items with * indicate a potential emergency and should be followed up as soon as possible.  Feel free to call the clinic you have any questions or concerns. The clinic phone number is [phone].  Please show the CHEMO ALERT CARD at check-in to the Emergency Department and triage nurse.    

## 2016-04-01 NOTE — Telephone Encounter (Signed)
Left msg regarding 04/26/2016 appts.

## 2016-04-01 NOTE — Progress Notes (Signed)
[facility] Work  Clinical Social Work was referred by Pension scheme manager for assessment of psychosocial needs and discuss resources for wigs.  Clinical Social Worker met with patient at [facility] during treatment to offer support and assess for needs.  Pt was resting during treatment, but awoke and CSW was able to review wig resources. CSW provided pt with wig handout with list of options to obtain a wig. CSW also provided with wig voucher to get gift card through the gift shop. CSW discussed transportation needs with pt and her mother. Pt's mother often drives her to treatment. Pt provided with [employer] today. CSW will continue to follow and assist as needed.    Clinical Social Work interventions:  Resource education and referral  Loren Racer, [place] Worker [place]  [place] Phone: [phone] Fax: [phone]

## 2016-04-01 NOTE — Progress Notes (Signed)
[name]    HEMATOLOGY/ONCOLOGY CLINIC NOTE  Date of Service: .03/18/2016  Patient Care Team: Nolene Ebbs, MD as PCP - General (Internal Medicine)  CHIEF COMPLAINTS/PURPOSE OF CONSULTATION:  Newly diagnosed Lung Adenocarcinoma.  Diagnosis Stage IIIB Lung Adenocarcinoma  Treatment -Concurrent Chemo-radiation with Carboplatin/Taxol. Completing RT on 02/13/2016  HISTORY OF PRESENTING ILLNESS: Plz see my previous note for details on initial presentation.  INTERVAL HISTORY  Patient is here for her scheduled toxicity check after her carboplatin/Taxol chemotherapy. She notes some grade 1-2 fatigue. No chest no shortness of breath. Radiation related skin injury resolving. Still has some mouth soreness and noted to have oral thrush and was given treatment for this. Eating a little less than usual. Notes that her DM is fairly controlled. No fevers/chills. Some bodyaches with neulasta shot.  MEDICAL HISTORY:  Past Medical History:  Diagnosis Date  . Asthma   . Diabetes mellitus   . Hypercholesteremia   . Hypertension   . Lung cancer ([place])   . Neuropathy ([place])   . Obese     SURGICAL HISTORY: Past Surgical History:  Procedure Laterality Date  . CESAREAN SECTION    . IR GENERIC HISTORICAL  01/08/2016   IR US GUIDE VASC ACCESS RIGHT 01/08/2016 [name], DO [facility]  . IR GENERIC HISTORICAL  01/08/2016   IR FLUORO GUIDE CV LINE RIGHT 01/08/2016 [name], DO [facility]  . TUBAL LIGATION    . US guided core needle biopsy     of right lower neck/supraclavicular lymph nodes    SOCIAL HISTORY: Social History   Social History  . Marital status: Divorced    Spouse name: N/A  . Number of children: N/A  . Years of education: N/A   Occupational History  . Not on file.   Social History Main Topics  . Smoking status: Never Smoker  . Smokeless tobacco: Never Used  . Alcohol use No  . Drug use: No  . Sexual activity: Not Currently   Other Topics Concern  . Not on  file   Social History Narrative   No bird or mold exposure. No recent travel.    FAMILY HISTORY: Family History  Problem Relation Age of Onset  . Cancer Neg Hx   . Rheumatologic disease Neg Hx     ALLERGIES:  has No Known Allergies.  MEDICATIONS:  Current Outpatient Prescriptions  Medication Sig Dispense Refill  . aspirin EC 81 MG tablet Take 1 tablet (81 mg total) by mouth daily. 60 tablet 2  . cephALEXin (KEFLEX) 500 MG capsule Take 1 capsule (500 mg total) by mouth 2 (two) times daily. 20 capsule 0  . chlorhexidine (PERIDEX) 0.12 % solution Use as directed 15 mLs in the mouth or throat 2 (two) times daily. 120 mL 0  . EQ ALLERGY RELIEF CHILDRENS 12.5 MG/5ML liquid Take 12.5 mg by mouth daily as needed for allergies.     . Fluocinonide 0.1 % CREA Apply 1 application topically daily.     [name] HYDROcodone-acetaminophen (HYCET) 7.5-325 mg/15 ml solution Take 15 mLs by mouth 4 (four) times daily as needed for moderate pain. 473 mL 0  . insulin aspart (NOVOLOG) 100 UNIT/ML FlexPen As per sliding scale instructions 15 mL 2  . Insulin Degludec (TRESIBA FLEXTOUCH) 200 UNIT/ML SOPN Inject 100 Units into the skin daily before breakfast.     . magic mouthwash w/lidocaine SOLN Take 5 mLs by mouth 4 (four) times daily as needed for mouth pain. 400 mL 1  . metoprolol succinate (  TOPROL-XL) 25 MG 24 hr tablet Take 1 tablet (25 mg total) by mouth daily. 30 tablet 1  . nystatin (MYCOSTATIN) 100000 UNIT/ML suspension Take 5 mLs (500,000 Units total) by mouth 4 (four) times daily. 200 mL 0  . promethazine (PHENERGAN) 25 MG tablet Take 1 tablet (25 mg total) by mouth every 6 (six) hours as needed for nausea or vomiting. 12 tablet 0  . sucralfate (CARAFATE) 1 g tablet Take 1 tablet (1 g total) by mouth 4 (four) times daily -  with meals and at bedtime. 5 min before meals for radiation induced esophagitis 120 tablet 2  . Wound Dressings (SONAFINE) Apply 1 application topically 2 (two) times daily.      Current Facility-Administered Medications  Medication Dose Route Frequency Provider Last Rate Last Dose  . sodium chloride 0.9 % injection 10 mL  10 mL Intravenous PRN [name], MD   10 mL at 03/18/16 1335    REVIEW OF SYSTEMS:    10 Point review of Systems was done is negative except as noted above.  PHYSICAL EXAMINATION: ECOG PERFORMANCE STATUS: 1 - Symptomatic but completely ambulatory  . Vitals:   03/18/16 1334  BP: (!) 146/90  Pulse: (!) 131  Resp: 18  Temp: 98.5 F (36.9 C)   Filed Weights   03/18/16 1334  Weight: 264 lb 11.2 oz (120.1 kg)   .Body mass index is 39.09 kg/m.  GENERAL:alert, in no acute distress and comfortable SKIN: radiation related skin injury over neck - healing EYES: normal, conjunctiva are pink and non-injected, sclera clear OROPHARYNX:oral thrush noted NECK: supple, no JVD, thyroid normal size, non-tender, without nodularity LYMPH:  no palpable lymphadenopathy in the cervical, axillary or inguinal.             LUNGS: clear to auscultation with normal respiratory effort. Distant breath sounds  HEART: regular rate & rhythm,  no murmurs and no lower extremity edema ABDOMEN: abdomen obese, soft, non-tender, normoactive bowel sounds  Musculoskeletal: no cyanosis of digits and no clubbing  PSYCH: alert & oriented x 3 with fluent speech NEURO: no focal motor/sensory deficits  LABORATORY DATA: . CBC Latest Ref Rng & Units 03/18/2016 03/17/2016  WBC 3.9 - 10.3 10e3/uL 7.2 -  Hemoglobin 11.6 - 15.9 g/dL 11.2(L) 10.9(L)  Hematocrit 34.8 - 46.6 % 33.6(L) 32.0(L)  Platelets 145 - 400 10e3/uL 195 -   . CMP Latest Ref Rng & Units 03/18/2016 03/17/2016  Glucose 70 - 140 mg/dl 238(H) 257(H)  BUN 7.0 - 26.0 mg/dL 7.3 8  Creatinine 0.6 - 1.1 mg/dL 0.7 0.50  Sodium 136 - 145 mEq/L 137 141  Potassium 3.5 - 5.1 mEq/L 3.4(L) 3.3(L)  Chloride 101 - 111 mmol/L - 101  CO2 22 - 29 mEq/L 24 -  Calcium 8.4 - 10.4 mg/dL 8.7 -  Total Protein 6.4 -  8.3 g/dL 7.1 -  Total Bilirubin 0.20 - 1.20 mg/dL 0.64 -  Alkaline Phos 40 - 150 U/L 102 -  AST 5 - 34 U/L 11 -  ALT 0 - 55 U/L 13 -   Magnesium 1.1.           RADIOGRAPHIC STUDIES: I have personally reviewed the radiological images as listed and agreed with the findings in the report. PET/CT IMPRESSION: 1. Hypermetabolic 1.7 cm peripheral right hilar nodule. Given the peripheral location of this nodule within the right hilum, this could potentially represent a centrally located primary bronchogenic malignancy. 2. Hypermetabolic bulky ipsilateral paratracheal, bilateral prevascular mediastinal and bilateral scalene  nodal metastases. 3. Hypermetabolic right lower neck nodal metastases involving right neck nodal levels III, IV and V. 4. No hypermetabolic metastatic disease in the abdomen, pelvis or skeleton. 5. Aortic atherosclerosis.   Electronically Signed   By: Ilona Sorrel M.D.   On: 01/12/2016 09:42      ASSESSMENT & PLAN:   50 year old African-American female with history of hypertension, diabetes, asthma, dysuria mass and morbid obesity with   #1 Newly diagnosed Lung Adenocarcinoma Rt sided atleast Stage IIIB with large right paratracheal mass with mediastinal adenopathy that appears to have grown significantly over the last 6-7 months and rt supraclavicular LN +.   Noted to have a small mass in the left adrenal on CT but PET/CT neg for metastatic disease. Patient has been a lifelong nonsmoker  MRI of the brain was negative for any metastatic disease.  High PDL1 expression (90%) on [facility] One Neg for EGFR, ALK, ROS-1 and BRAF mutations.   patient has completed her planned definitive chemoradiation with carbo Taxol on 02/13/2016. No prohibitive toxicities other than some grade 1 skin desquamation and some grade 1-2 radiation esophagitis. She has subsequently received 1 out of 2 planned dose of carboplatin + Taxol. Plan -labs stable. No prohibitive  toxicity from chemotherapy at this time other than Grade 1-2 fatigue and grade 1 anorexia. -nystatin for oral thrush -magic mouth wash prn for radiation esophagitis -continue C2 of carbo/taxol as per orders --rpt PET/CT after completion of planned chemotherapy to reassess status of her disease -if stable disease/good response  after these treatment would consider Durvalumab. -continue f/u with radiation oncology as per their recommendations. -recommended maintenance of good po hydration and food intake  #2 Sinus tachycardia - on toprol XL , no PE on CTA. Maintain good hydration. Pain mx as needed.  #3 neoplasm related pain is currently controlled without significant medications. Clinically likely has sleep apnea . Would need to be careful with sedative medications . Plan -carafate and prn magic mouthwash for radiation esophagitis related symptoms. #3 hypertension   #4 diabetes #5 dyslipidemia #6 asthma Plan - insulin SS for uncontrolled hyperglycemia in the setting of steroid use with chemotherapy. -continue f/u with PCP   complete 2nd cycle of carboplatin/taxol as planned on 12/29. RTC in 5 weeks with pet/ct and labs  . Orders Placed This Encounter  Procedures  . NM PET Image Restag (PS) Skull Base To Thigh    Standing Status:   Future    Standing Expiration Date:   03/18/2017    Order Specific Question:   Reason for Exam (SYMPTOM  OR DIAGNOSIS REQUIRED)    Answer:   re-evaluation of stage IIIB lung cancer to determine further treatment strategy and response to treatment    Order Specific Question:   Is the patient pregnant?    Answer:   No    Order Specific Question:   Preferred imaging location?    Answer:   [facility]    Order Specific Question:   If indicated for the ordered procedure, I authorize the administration of a radiopharmaceutical per Radiology protocol    Answer:   Yes  . CBC & Diff and Retic    Standing Status:   Future    Standing Expiration  Date:   03/18/2017  . Comprehensive metabolic panel    Standing Status:   Future    Standing Expiration Date:   03/18/2017    All of the patients questions were answered with apparent satisfaction. The patient knows to call  the clinic with any problems, questions or concerns.  I spent 20 minutes counseling the patient face to face. The total time spent in the appointment was 25 minutes and more than 50% was on counseling and direct patient cares.    Sullivan Lone MD [place] AAHIVMS [facility] Hematology/Oncology Physician [facility]  (Office):       [phone] (Work cell):  [phone] (Fax):           [phone]

## 2016-04-03 ENCOUNTER — Ambulatory Visit (HOSPITAL_BASED_OUTPATIENT_CLINIC_OR_DEPARTMENT_OTHER): Payer: Medicaid Other

## 2016-04-03 VITALS — BP 129/85 | HR 109 | Temp 97.5°F | Resp 18

## 2016-04-03 DIAGNOSIS — C3492 Malignant neoplasm of unspecified part of left bronchus or lung: Secondary | ICD-10-CM

## 2016-04-03 DIAGNOSIS — C3491 Malignant neoplasm of unspecified part of right bronchus or lung: Secondary | ICD-10-CM

## 2016-04-03 MED ORDER — PEGFILGRASTIM INJECTION 6 MG/0.6ML ~~LOC~~
6.0000 mg | PREFILLED_SYRINGE | Freq: Once | SUBCUTANEOUS | Status: AC
  Administered 2016-04-03: 6 mg via SUBCUTANEOUS

## 2016-04-03 NOTE — Patient Instructions (Signed)
Pegfilgrastim injection What is this medicine? PEGFILGRASTIM (PEG fil gra stim) is a long-acting granulocyte colony-stimulating factor that stimulates the growth of neutrophils, a type of white blood cell important in the body's fight against infection. It is used to reduce the incidence of fever and infection in patients with certain types of cancer who are receiving chemotherapy that affects the bone marrow, and to increase survival after being exposed to high doses of radiation. This medicine may be used for other purposes; ask your health care provider or pharmacist if you have questions. COMMON BRAND NAME(S): Neulasta What should I tell my health care provider before I take this medicine? They need to know if you have any of these conditions: -kidney disease -latex allergy -ongoing radiation therapy -sickle cell disease -skin reactions to acrylic adhesives (On-Body Injector only) -an unusual or allergic reaction to pegfilgrastim, filgrastim, other medicines, foods, dyes, or preservatives -pregnant or trying to get pregnant -breast-feeding How should I use this medicine? This medicine is for injection under the skin. If you get this medicine at home, you will be taught how to prepare and give the pre-filled syringe or how to use the On-body Injector. Refer to the patient Instructions for Use for detailed instructions. Use exactly as directed. Take your medicine at regular intervals. Do not take your medicine more often than directed. It is important that you put your used needles and syringes in a special sharps container. Do not put them in a trash can. If you do not have a sharps container, call your pharmacist or healthcare provider to get one. Talk to your pediatrician regarding the use of this medicine in children. While this drug may be prescribed for selected conditions, precautions do apply. Overdosage: If you think you have taken too much of this medicine contact a poison control  center or emergency room at once. NOTE: This medicine is only for you. Do not share this medicine with others. What if I miss a dose? It is important not to miss your dose. Call your doctor or health care professional if you miss your dose. If you miss a dose due to an On-body Injector failure or leakage, a new dose should be administered as soon as possible using a single prefilled syringe for manual use. What may interact with this medicine? Interactions have not been studied. Give your health care provider a list of all the medicines, herbs, non-prescription drugs, or dietary supplements you use. Also tell them if you smoke, drink alcohol, or use illegal drugs. Some items may interact with your medicine. This list may not describe all possible interactions. Give your health care provider a list of all the medicines, herbs, non-prescription drugs, or dietary supplements you use. Also tell them if you smoke, drink alcohol, or use illegal drugs. Some items may interact with your medicine. What should I watch for while using this medicine? You may need blood work done while you are taking this medicine. If you are going to need a MRI, CT scan, or other procedure, tell your doctor that you are using this medicine (On-Body Injector only). What side effects may I notice from receiving this medicine? Side effects that you should report to your doctor or health care professional as soon as possible: -allergic reactions like skin rash, itching or hives, swelling of the face, lips, or tongue -dizziness -fever -pain, redness, or irritation at site where injected -pinpoint red spots on the skin -red or dark-brown urine -shortness of breath or breathing problems -stomach or   side pain, or pain at the shoulder -swelling -tiredness -trouble passing urine or change in the amount of urine Side effects that usually do not require medical attention (report to your doctor or health care professional if they  continue or are bothersome): -bone pain -muscle pain This list may not describe all possible side effects. Call your doctor for medical advice about side effects. You may report side effects to FDA at 1-800-FDA-1088. Where should I keep my medicine? Keep out of the reach of children. Store pre-filled syringes in a refrigerator between 2 and 8 degrees C (36 and 46 degrees F). Do not freeze. Keep in carton to protect from light. Throw away this medicine if it is left out of the refrigerator for more than 48 hours. Throw away any unused medicine after the expiration date. NOTE: This sheet is a summary. It may not cover all possible information. If you have questions about this medicine, talk to your doctor, pharmacist, or health care provider.  2017 Elsevier/Gold Standard (2014-04-11 14:30:14)  

## 2016-04-07 ENCOUNTER — Encounter: Payer: Self-pay | Admitting: Nurse Practitioner

## 2016-04-07 ENCOUNTER — Ambulatory Visit (HOSPITAL_COMMUNITY)
Admission: RE | Admit: 2016-04-07 | Discharge: 2016-04-07 | Disposition: A | Discharge status: Home / Self Care | Payer: Medicaid Other | Source: Ambulatory Visit | Attending: Nurse Practitioner | Admitting: Nurse Practitioner

## 2016-04-07 ENCOUNTER — Ambulatory Visit (HOSPITAL_BASED_OUTPATIENT_CLINIC_OR_DEPARTMENT_OTHER): Payer: Medicaid Other

## 2016-04-07 ENCOUNTER — Other Ambulatory Visit: Payer: Self-pay | Admitting: *Deleted

## 2016-04-07 ENCOUNTER — Other Ambulatory Visit: Payer: Self-pay | Admitting: Nurse Practitioner

## 2016-04-07 ENCOUNTER — Ambulatory Visit (HOSPITAL_BASED_OUTPATIENT_CLINIC_OR_DEPARTMENT_OTHER): Payer: Medicaid Other | Admitting: Nurse Practitioner

## 2016-04-07 VITALS — BP 142/87 | HR 134 | Temp 97.6°F | Resp 18 | Ht 69.0 in

## 2016-04-07 DIAGNOSIS — E119 Type 2 diabetes mellitus without complications: Secondary | ICD-10-CM | POA: Diagnosis not present

## 2016-04-07 DIAGNOSIS — B370 Candidal stomatitis: Secondary | ICD-10-CM | POA: Diagnosis not present

## 2016-04-07 DIAGNOSIS — Z95828 Presence of other vascular implants and grafts: Secondary | ICD-10-CM

## 2016-04-07 DIAGNOSIS — C3491 Malignant neoplasm of unspecified part of right bronchus or lung: Secondary | ICD-10-CM

## 2016-04-07 DIAGNOSIS — B3781 Candidal esophagitis: Secondary | ICD-10-CM

## 2016-04-07 DIAGNOSIS — K5900 Constipation, unspecified: Secondary | ICD-10-CM | POA: Diagnosis not present

## 2016-04-07 DIAGNOSIS — R0789 Other chest pain: Secondary | ICD-10-CM | POA: Diagnosis not present

## 2016-04-07 DIAGNOSIS — R000 Tachycardia, unspecified: Secondary | ICD-10-CM | POA: Diagnosis not present

## 2016-04-07 DIAGNOSIS — G8929 Other chronic pain: Secondary | ICD-10-CM

## 2016-04-07 DIAGNOSIS — E860 Dehydration: Secondary | ICD-10-CM

## 2016-04-07 DIAGNOSIS — R590 Localized enlarged lymph nodes: Secondary | ICD-10-CM | POA: Insufficient documentation

## 2016-04-07 DIAGNOSIS — E0800 Diabetes mellitus due to underlying condition with hyperosmolarity without nonketotic hyperglycemic-hyperosmolar coma (NKHHC): Secondary | ICD-10-CM

## 2016-04-07 LAB — COMPREHENSIVE METABOLIC PANEL
ALT: 11 U/L (ref 0–55)
ANION GAP: 12 meq/L — AB (ref 3–11)
AST: 10 U/L (ref 5–34)
Albumin: 3.4 g/dL — ABNORMAL LOW (ref 3.5–5.0)
Alkaline Phosphatase: 125 U/L (ref 40–150)
BILIRUBIN TOTAL: 1.01 mg/dL (ref 0.20–1.20)
BUN: 17.1 mg/dL (ref 7.0–26.0)
CHLORIDE: 102 meq/L (ref 98–109)
CO2: 24 meq/L (ref 22–29)
Calcium: 8.9 mg/dL (ref 8.4–10.4)
Creatinine: 0.7 mg/dL (ref 0.6–1.1)
Glucose: 282 mg/dl — ABNORMAL HIGH (ref 70–140)
Potassium: 3.7 mEq/L (ref 3.5–5.1)
Sodium: 138 mEq/L (ref 136–145)
Total Protein: 7.3 g/dL (ref 6.4–8.3)

## 2016-04-07 LAB — CBC WITH DIFFERENTIAL/PLATELET
BASO%: 0.1 % (ref 0.0–2.0)
Basophils Absolute: 0 10*3/uL (ref 0.0–0.1)
EOS%: 0.4 % (ref 0.0–7.0)
Eosinophils Absolute: 0 10*3/uL (ref 0.0–0.5)
HCT: 32.1 % — ABNORMAL LOW (ref 34.8–46.6)
HGB: 10.9 g/dL — ABNORMAL LOW (ref 11.6–15.9)
LYMPH%: 11.3 % — AB (ref 14.0–49.7)
MCH: 28.4 pg (ref 25.1–34.0)
MCHC: 34 g/dL (ref 31.5–36.0)
MCV: 83.6 fL (ref 79.5–101.0)
MONO#: 0.4 10*3/uL (ref 0.1–0.9)
MONO%: 5.7 % (ref 0.0–14.0)
NEUT#: 6 10*3/uL (ref 1.5–6.5)
NEUT%: 82.5 % — AB (ref 38.4–76.8)
PLATELETS: 113 10*3/uL — AB (ref 145–400)
RBC: 3.84 10*6/uL (ref 3.70–5.45)
RDW: 15.9 % — ABNORMAL HIGH (ref 11.2–14.5)
WBC: 7.3 10*3/uL (ref 3.9–10.3)
lymph#: 0.8 10*3/uL — ABNORMAL LOW (ref 0.9–3.3)

## 2016-04-07 MED ORDER — FLUCONAZOLE 100 MG PO TABS: 100.0000 mg | ORAL_TABLET | Freq: Every day | ORAL | 1 refills | Status: DC

## 2016-04-07 MED ORDER — HYDROCODONE-ACETAMINOPHEN 5-325 MG PO TABS
2.0000 | ORAL_TABLET | Freq: Once | ORAL | Status: AC
  Administered 2016-04-07: 2 via ORAL

## 2016-04-07 MED ORDER — SODIUM CHLORIDE 0.9 % IJ SOLN
10.0000 mL | INTRAMUSCULAR | Status: DC | PRN
  Administered 2016-04-07: 10 mL via INTRAVENOUS
  Filled 2016-04-07: qty 10

## 2016-04-07 MED ORDER — HEPARIN SOD (PORK) LOCK FLUSH 100 UNIT/ML IV SOLN
500.0000 [IU] | Freq: Once | INTRAVENOUS | Status: AC | PRN
  Administered 2016-04-07: 250 [IU] via INTRAVENOUS
  Filled 2016-04-07: qty 5

## 2016-04-07 MED ORDER — ONDANSETRON HCL 4 MG/2ML IJ SOLN
INTRAMUSCULAR | Status: AC
  Filled 2016-04-07: qty 4

## 2016-04-07 MED ORDER — METOPROLOL SUCCINATE ER 25 MG PO TB24
25.0000 mg | ORAL_TABLET | Freq: Once | ORAL | Status: AC
  Administered 2016-04-07: 25 mg via ORAL
  Filled 2016-04-07: qty 1

## 2016-04-07 MED ORDER — ONDANSETRON HCL 4 MG/2ML IJ SOLN
8.0000 mg | Freq: Once | INTRAMUSCULAR | Status: AC
  Administered 2016-04-07: 8 mg via INTRAVENOUS

## 2016-04-07 MED ORDER — SODIUM CHLORIDE 0.9 % IV SOLN: 8.0000 mg | Freq: Once | INTRAVENOUS | Status: DC

## 2016-04-07 MED ORDER — HYDROCODONE-ACETAMINOPHEN 5-325 MG PO TABS
ORAL_TABLET | ORAL | Status: AC
  Filled 2016-04-07: qty 2

## 2016-04-07 MED ORDER — METOPROLOL SUCCINATE 12.5 MG HALF TABLET: 25.0000 mg | ORAL_TABLET | Freq: Once | ORAL | Status: DC

## 2016-04-07 MED ORDER — SODIUM CHLORIDE 0.9 % IV SOLN
INTRAVENOUS | Status: AC
  Administered 2016-04-07: 13:00:00 via INTRAVENOUS

## 2016-04-07 NOTE — Progress Notes (Signed)
SYMPTOM MANAGEMENT CLINIC    Chief Complaint: Dehydration, lung cancer.    HPI:  Marilyn [name] 51 y.o. female diagnosed with patient is status post radiation treatments.  Patient completed her last chemotherapy on 04/01/2016.  Currently undergoing observation only.   No history exists.    Review of Systems  Constitutional: Positive for malaise/fatigue.  Cardiovascular:       Occasional vague chest wall discomfort with specific movements only.  Gastrointestinal: Positive for constipation and nausea.  Neurological: Positive for weakness.  All other systems reviewed and are negative.   Past Medical History:  Diagnosis Date  . Asthma   . Diabetes mellitus   . Hypercholesteremia   . Hypertension   . Lung cancer ([facility])   . Neuropathy ([facility])   . Obese     Past Surgical History:  Procedure Laterality Date  . CESAREAN SECTION    . IR GENERIC HISTORICAL  01/08/2016   IR US GUIDE VASC ACCESS RIGHT 01/08/2016 Gilmer Mor, DO [facility]  . IR GENERIC HISTORICAL  01/08/2016   IR FLUORO GUIDE CV LINE RIGHT 01/08/2016 Gilmer Mor, DO [facility]  . TUBAL LIGATION    . US guided core needle biopsy     of right lower neck/supraclavicular lymph nodes    has Chest wall pain, chronic; Headache; Diabetes mellitus (HCC); Mediastinal mass; Non-small cell cancer of right lung (HCC); Adenocarcinoma of left lung, stage 3 (HCC); Dehydration; Tachycardia; Port catheter in place; Palliative care by specialist; Thrush of mouth and esophagus ([facility]); and Constipation on her problem list.    has No Known Allergies.  Allergies as of 04/07/2016   No Known Allergies     Medication List       Accurate as of 04/07/16  3:24 PM. Always use your most recent med list.          aspirin EC 81 MG tablet Take 1 tablet (81 mg total) by mouth daily.   cephALEXin 500 MG capsule Commonly known as:  KEFLEX Take 1 capsule (500 mg total) by mouth 2 (two) times daily.   chlorhexidine 0.12 %  solution Commonly known as:  PERIDEX Use as directed 15 mLs in the mouth or throat 2 (two) times daily.   EQ ALLERGY RELIEF CHILDRENS 12.5 MG/5ML liquid Generic drug:  diphenhydrAMINE Take 12.5 mg by mouth daily as needed for allergies.   fluconazole 100 MG tablet Commonly known as:  DIFLUCAN Take 1 tablet (100 mg total) by mouth daily.   Fluocinonide 0.1 % Crea Apply 1 application topically daily.   HYDROcodone-acetaminophen 7.5-325 mg/15 ml solution Commonly known as:  HYCET Take 15 mLs by mouth 4 (four) times daily as needed for moderate pain.   insulin aspart 100 UNIT/ML FlexPen Commonly known as:  NOVOLOG As per sliding scale instructions   magic mouthwash w/lidocaine Soln Take 5 mLs by mouth 4 (four) times daily as needed for mouth pain.   metoprolol succinate 25 MG 24 hr tablet Commonly known as:  TOPROL-XL Take 1 tablet (25 mg total) by mouth daily.   nystatin 100000 UNIT/ML suspension Commonly known as:  MYCOSTATIN Take 5 mLs (500,000 Units total) by mouth 4 (four) times daily.   promethazine 25 MG tablet Commonly known as:  PHENERGAN Take 1 tablet (25 mg total) by mouth every 6 (six) hours as needed for nausea or vomiting.   SONAFINE Apply 1 application topically 2 (two) times daily.   sucralfate 1 g tablet Commonly known as:  CARAFATE Take 1 tablet (  1 g total) by mouth 4 (four) times daily -  with meals and at bedtime. 5 min before meals for radiation induced esophagitis   TRESIBA FLEXTOUCH 200 UNIT/ML Sopn Generic drug:  Insulin Degludec Inject 100 Units into the skin daily before breakfast.        PHYSICAL EXAMINATION  Oncology Vitals 04/07/2016 04/07/2016  Height - 175 cm  Weight - (No Data)  Weight (lbs) - (No Data)  BMI (kg/m2) - -  Temp - 97.6  Pulse 134 144  Resp - 18  SpO2 - 100  BSA (m2) - -   BP Readings from Last 2 Encounters:  04/07/16 (!) 142/87  04/03/16 129/85    Physical Exam  Constitutional: She is oriented to person,  place, and time. She appears dehydrated. She appears unhealthy.  HENT:  Head: Normocephalic and atraumatic.  Mouth/Throat: Oropharynx is clear and moist.  Eyes: Conjunctivae and EOM are normal. Pupils are equal, round, and reactive to light. Right eye exhibits no discharge. Left eye exhibits no discharge. No scleral icterus.  Neck: Normal range of motion. Neck supple. No JVD present. No tracheal deviation present. No thyromegaly present.  Cardiovascular: Normal rate, regular rhythm, normal heart sounds and intact distal pulses.   Pulmonary/Chest: Effort normal and breath sounds normal. No respiratory distress. She has no wheezes. She has no rales. She exhibits no tenderness.  Abdominal: Soft. Bowel sounds are normal. She exhibits no distension and no mass. There is no tenderness. There is no rebound and no guarding.  Musculoskeletal: Normal range of motion. She exhibits no edema or tenderness.  Lymphadenopathy:    She has no cervical adenopathy.  Neurological: She is alert and oriented to person, place, and time. Gait normal.  Skin: Skin is warm and dry. No rash noted. No erythema. No pallor.  Psychiatric:  Depressed, flat affect.  Nursing note and vitals reviewed.   LABORATORY DATA:. Appointment on 04/07/2016  Component Date Value Ref Range Status  . WBC 04/07/2016 7.3  3.9 - 10.3 10e3/uL Final  . NEUT# 04/07/2016 6.0  1.5 - 6.5 10e3/uL Final  . HGB 04/07/2016 10.9* 11.6 - 15.9 g/dL Final  . HCT 04/07/2016 32.1* 34.8 - 46.6 % Final  . Platelets 04/07/2016 113* 145 - 400 10e3/uL Final  . MCV 04/07/2016 83.6  79.5 - 101.0 fL Final  . MCH 04/07/2016 28.4  25.1 - 34.0 pg Final  . MCHC 04/07/2016 34.0  31.5 - 36.0 g/dL Final  . RBC 04/07/2016 3.84  3.70 - 5.45 10e6/uL Final  . RDW 04/07/2016 15.9* 11.2 - 14.5 % Final  . lymph# 04/07/2016 0.8* 0.9 - 3.3 10e3/uL Final  . MONO# 04/07/2016 0.4  0.1 - 0.9 10e3/uL Final  . Eosinophils Absolute 04/07/2016 0.0  0.0 - 0.5 10e3/uL Final  .  Basophils Absolute 04/07/2016 0.0  0.0 - 0.1 10e3/uL Final  . NEUT% 04/07/2016 82.5* 38.4 - 76.8 % Final  . LYMPH% 04/07/2016 11.3* 14.0 - 49.7 % Final  . MONO% 04/07/2016 5.7  0.0 - 14.0 % Final  . EOS% 04/07/2016 0.4  0.0 - 7.0 % Final  . BASO% 04/07/2016 0.1  0.0 - 2.0 % Final  . Sodium 04/07/2016 138  136 - 145 mEq/L Final  . Potassium 04/07/2016 3.7  3.5 - 5.1 mEq/L Final  . Chloride 04/07/2016 102  98 - 109 mEq/L Final  . CO2 04/07/2016 24  22 - 29 mEq/L Final  . Glucose 04/07/2016 282* 70 - 140 mg/dl Final  . BUN 04/07/2016 17.1  7.0 - 26.0 mg/dL Final  . Creatinine 04/07/2016 0.7  0.6 - 1.1 mg/dL Final  . Total Bilirubin 04/07/2016 1.01  0.20 - 1.20 mg/dL Final  . Alkaline Phosphatase 04/07/2016 125  40 - 150 U/L Final  . AST 04/07/2016 10  5 - 34 U/L Final  . ALT 04/07/2016 11  0 - 55 U/L Final  . Total Protein 04/07/2016 7.3  6.4 - 8.3 g/dL Final  . Albumin 04/07/2016 3.4* 3.5 - 5.0 g/dL Final  . Calcium 04/07/2016 8.9  8.4 - 10.4 mg/dL Final  . Anion Gap 04/07/2016 12* 3 - 11 mEq/L Final  . EGFR 04/07/2016 >90  >90 ml/min/1.73 m2 Final    RADIOGRAPHIC STUDIES: Dg Abd Acute W/chest  Result Date: 04/07/2016 CLINICAL DATA:  Abdominal pain EXAM: DG ABDOMEN ACUTE W/ 1V CHEST COMPARISON:  None. FINDINGS: There is no evidence of dilated bowel loops or free intraperitoneal air. No radiopaque calculi or other significant radiographic abnormality is seen. Heart size is normal. Right paratracheal lymphadenopathy. Both lungs are clear. Right-sided PICC line with the tip projecting over the SVC. IMPRESSION: Negative abdominal radiographs.  No acute cardiopulmonary disease. Right paratracheal lymphadenopathy. Electronically Signed   By: Kathreen Devoid   On: 04/07/2016 12:46    ASSESSMENT/PLAN:    Ritta Slot of mouth and esophagus [facility] Patient states that she feels that she has a "lump in her throat" in her throat is somewhat sore.  She states that she's had minimal appetite and very poor oral  intake recently.  Exam today reveals a thick white coating to patient's tongue.  Reviewed all with Dr. Irene Limbo and he advised Diflucan 100 mg per day orally for treatment of thrush.  Will also need to consider nystatin swish and spit/swallow if symptoms do not improve.    Tachycardia Patient has a history of tachycardia; and typically takes Toprol X, L 25 mg on a daily basis.  However, patient admits that she has not taken any of her medications-including her metoprolol within the past 3-4 days.  Heart rate on initial check was 144, and blood pressure was 128/92.  Patient was given the Toprol-XL 25 mg oral tablet she was at the cancer center receiving IV fluid rehydration.  We'll recheck vital signs prior to patient's discharge today.  Also, reviewed all of patient's medication list with both her and her mother.  Patient was strongly advised/encouraged to take all of her medications-including her metoprolol and diabetic medications.  Daily as directed. ____________________  Update:  Heart rate approximately one hour after taking the Toprol-XL 25 mg tablet was 134.  Patient was advised to monitor her heart rate at home; and to go directly to the emergency department overnightif her heart rate does not return to baseline; or she develops any other worsening problems, whatsoever.    Non-small cell cancer of right lung [facility]) Patient received her last cycle of Taxol/carboplatin chemotherapy regimen on the super 28th 2017.  She is currently undergoing observation only.  She is scheduled for restaging PET scan on 04/22/2016.  She is scheduled for labs and a follow-up visit on 04/26/2016.  Diabetes mellitus ([place]) Patient has history of diabetes as well; and blood sugar today was 282.  Patient admits that she is not taking any of her diabetic medications./Injections for the past 3-4 days.  Patient stated that she felt too poorly to take any of her medications.  Also, reviewed all of patient's medication  list with both her and her mother.  Patient was strongly advised/encouraged  to take all of her medications-including her metoprolol and diabetic medications.  Daily as directed.  Dehydration Patient states that she's had a very sore tongue/sore throat and minimal appetite recently.  She's had very poor oral intake and feels dehydrated.  She states that she is only urinating a small amount as well.  She denies any UTI symptoms at this point.  She denies any recent fevers or chills.  Patient will receive IV fluid rehydration while at the cancer Center today.  She was also encouraged to push fluids at home is much as possible.  Constipation Patient states she's not had a bowel movement in 4 days.  She has taken no laxatives or stool softeners to help with constipation.  Acute abdomen.  Plain film x-ray revealed no acute findings.  Patient will be given both verbal and written instructions regarding the use of both stool softeners and laxatives to clear any constipation.  Chest wall pain, chronic Patient has a history of intermittent, vague chest wall discomfort when she moves in certain positions.  She states she has no chest wall pain at this present time; but has experienced this discomfort on a few different occasions within this past week.  Patient states that she has been lying in bed most of this past weekend.  She states that she is almost too weak to stand.  She did require assistance ambulating from the chair to the wheelchair.  X-ray obtained today of the chest revealed no acute findings.  Whatsoever.  Patient was advised to call/return or go directly to the emergency department for any worsening symptoms whatsoever.   Patient stated understanding of all instructions; and was in agreement with this plan of care. The patient knows to call the clinic with any problems, questions or concerns.   Total time spent with patient was 40 minutes;  with greater than 75 percent of that time spent in  face to face counseling regarding patient's symptoms,  and coordination of care and follow up.  Disclaimer:This dictation was prepared with Dragon/digital dictation along with [employer]. Any transcriptional errors that result from this process are unintentional.  Drue Second, NP 04/07/2016

## 2016-04-07 NOTE — Patient Instructions (Signed)
Dehydration, Adult Dehydration is a condition in which there is not enough fluid or water in the body. This happens when you lose more fluids than you take in. Important organs, such as the kidneys, brain, and heart, cannot function without a proper amount of fluids. Any loss of fluids from the body can lead to dehydration. Dehydration can range from mild to severe. This condition should be treated right away to prevent it from becoming severe. What are the causes? This condition may be caused by:  Vomiting.  Diarrhea.  Excessive sweating, such as from heat exposure or exercise.  Not drinking enough fluid, especially:  When ill.  While doing activity that requires a lot of energy.  Excessive urination.  Fever.  Infection.  Certain medicines, such as medicines that cause the body to lose excess fluid (diuretics).  Inability to access safe drinking water.  Reduced physical ability to get adequate water and food. What increases the risk? This condition is more likely to develop in people:  Who have a poorly controlled long-term (chronic) illness, such as diabetes, heart disease, or kidney disease.  Who are age 65 or older.  Who are disabled.  Who live in a place with high altitude.  Who play endurance sports. What are the signs or symptoms? Symptoms of mild dehydration may include:   Thirst.  Dry lips.  Slightly dry mouth.  Dry, warm skin.  Dizziness. Symptoms of moderate dehydration may include:   Very dry mouth.  Muscle cramps.  Dark urine. Urine may be the color of tea.  Decreased urine production.  Decreased tear production.  Heartbeat that is irregular or faster than normal (palpitations).  Headache.  Light-headedness, especially when you stand up from a sitting position.  Fainting (syncope). Symptoms of severe dehydration may include:   Changes in skin, such as:  Cold and clammy skin.  Blotchy (mottled) or pale skin.  Skin that does  not quickly return to normal after being lightly pinched and released (poor skin turgor).  Changes in body fluids, such as:  Extreme thirst.  No tear production.  Inability to sweat when body temperature is high, such as in hot weather.  Very little urine production.  Changes in vital signs, such as:  Weak pulse.  Pulse that is more than 100 beats a minute when sitting still.  Rapid breathing.  Low blood pressure.  Other changes, such as:  Sunken eyes.  Cold hands and feet.  Confusion.  Lack of energy (lethargy).  Difficulty waking up from sleep.  Short-term weight loss.  Unconsciousness. How is this diagnosed? This condition is diagnosed based on your symptoms and a physical exam. Blood and urine tests may be done to help confirm the diagnosis. How is this treated? Treatment for this condition depends on the severity. Mild or moderate dehydration can often be treated at home. Treatment should be started right away. Do not wait until dehydration becomes severe. Severe dehydration is an emergency and it needs to be treated in a hospital. Treatment for mild dehydration may include:   Drinking more fluids.  Replacing salts and minerals in your blood (electrolytes) that you may have lost. Treatment for moderate dehydration may include:   Drinking an oral rehydration solution (ORS). This is a drink that helps you replace fluids and electrolytes (rehydrate). It can be found at pharmacies and retail stores. Treatment for severe dehydration may include:   Receiving fluids through an IV tube.  Receiving an electrolyte solution through a feeding tube that is   passed through your nose and into your stomach (nasogastric tube, or NG tube).  Correcting any abnormalities in electrolytes.  Treating the underlying cause of dehydration. Follow these instructions at home:  If directed by your health care provider, drink an ORS:  Make an ORS by following instructions on the  package.  Start by drinking small amounts, about  cup (120 mL) every 5-10 minutes.  Slowly increase how much you drink until you have taken the amount recommended by your health care provider.  Drink enough clear fluid to keep your urine clear or pale yellow. If you were told to drink an ORS, finish the ORS first, then start slowly drinking other clear fluids. Drink fluids such as:  Water. Do not drink only water. Doing that can lead to having too little salt (sodium) in the body (hyponatremia).  Ice chips.  Fruit juice that you have added water to (diluted fruit juice).  Low-calorie sports drinks.  Avoid:  Alcohol.  Drinks that contain a lot of sugar. These include high-calorie sports drinks, fruit juice that is not diluted, and soda.  Caffeine.  Foods that are greasy or contain a lot of fat or sugar.  Take over-the-counter and prescription medicines only as told by your health care provider.  Do not take sodium tablets. This can lead to having too much sodium in the body (hypernatremia).  Eat foods that contain a healthy balance of electrolytes, such as bananas, oranges, potatoes, tomatoes, and spinach.  Keep all follow-up visits as told by your health care provider. This is important. Contact a health care provider if:  You have abdominal pain that:  Gets worse.  Stays in one area (localizes).  You have a rash.  You have a stiff neck.  You are more irritable than usual.  You are sleepier or more difficult to wake up than usual.  You feel weak or dizzy.  You feel very thirsty.  You have urinated only a small amount of very dark urine over 6-8 hours. Get help right away if:  You have symptoms of severe dehydration.  You cannot drink fluids without vomiting.  Your symptoms get worse with treatment.  You have a fever.  You have a severe headache.  You have vomiting or diarrhea that:  Gets worse.  Does not go away.  You have blood or green matter  (bile) in your vomit.  You have blood in your stool. This may cause stool to look black and tarry.  You have not urinated in 6-8 hours.  You faint.  Your heart rate while sitting still is over 100 beats a minute.  You have trouble breathing. This information is not intended to replace advice given to you by your health care provider. Make sure you discuss any questions you have with your health care provider. Document Released: 03/22/2005 Document Revised: 10/17/2015 Document Reviewed: 05/16/2015 Elsevier Interactive Patient Education  2017 [employer]  

## 2016-04-07 NOTE — Assessment & Plan Note (Signed)
Patient states that she feels that she has a "lump in her throat" in her throat is somewhat sore.  She states that she's had minimal appetite and very poor oral intake recently.  Exam today reveals a thick white coating to patient's tongue.  Reviewed all with Dr. Irene Limbo and he advised Diflucan 100 mg per day orally for treatment of thrush.  Will also need to consider nystatin swish and spit/swallow if symptoms do not improve.

## 2016-04-07 NOTE — Assessment & Plan Note (Addendum)
Patient has a history of tachycardia; and typically takes Toprol X, L 25 mg on a daily basis.  However, patient admits that she has not taken any of her medications-including her metoprolol within the past 3-4 days.  Heart rate on initial check was 144, and blood pressure was 128/92.  Patient was given the Toprol-XL 25 mg oral tablet she was at the cancer center receiving IV fluid rehydration.  We'll recheck vital signs prior to patient's discharge today.  Also, reviewed all of patient's medication list with both her and her mother.  Patient was strongly advised/encouraged to take all of her medications-including her metoprolol and diabetic medications.  Daily as directed. ____________________  Update:  Heart rate approximately one hour after taking the Toprol-XL 25 mg tablet was 134.  Patient was advised to monitor her heart rate at home; and to go directly to the emergency department overnightif her heart rate does not return to baseline; or she develops any other worsening problems, whatsoever.

## 2016-04-07 NOTE — Assessment & Plan Note (Signed)
Patient states that she's had a very sore tongue/sore throat and minimal appetite recently.  She's had very poor oral intake and feels dehydrated.  She states that she is only urinating a small amount as well.  She denies any UTI symptoms at this point.  She denies any recent fevers or chills.  Patient will receive IV fluid rehydration while at the cancer Center today.  She was also encouraged to push fluids at home is much as possible.

## 2016-04-07 NOTE — Assessment & Plan Note (Signed)
Patient received her last cycle of Taxol/carboplatin chemotherapy regimen on the super 28th 2017.  She is currently undergoing observation only.  She is scheduled for restaging PET scan on 04/22/2016.  She is scheduled for labs and a follow-up visit on 04/26/2016.

## 2016-04-07 NOTE — Assessment & Plan Note (Signed)
Patient states she's not had a bowel movement in 4 days.  She has taken no laxatives or stool softeners to help with constipation.  Acute abdomen.  Plain film x-ray revealed no acute findings.  Patient will be given both verbal and written instructions regarding the use of both stool softeners and laxatives to clear any constipation.

## 2016-04-07 NOTE — Assessment & Plan Note (Signed)
Patient has history of diabetes as well; and blood sugar today was 282.  Patient admits that she is not taking any of her diabetic medications./Injections for the past 3-4 days.  Patient stated that she felt too poorly to take any of her medications.  Also, reviewed all of patient's medication list with both her and her mother.  Patient was strongly advised/encouraged to take all of her medications-including her metoprolol and diabetic medications.  Daily as directed.

## 2016-04-07 NOTE — Assessment & Plan Note (Signed)
Patient has a history of intermittent, vague chest wall discomfort when she moves in certain positions.  She states she has no chest wall pain at this present time; but has experienced this discomfort on a few different occasions within this past week.  Patient states that she has been lying in bed most of this past weekend.  She states that she is almost too weak to stand.  She did require assistance ambulating from the chair to the wheelchair.  X-ray obtained today of the chest revealed no acute findings.  Whatsoever.  Patient was advised to call/return or go directly to the emergency department for any worsening symptoms whatsoever.

## 2016-04-16 ENCOUNTER — Emergency Department (HOSPITAL_COMMUNITY): Payer: Medicaid Other

## 2016-04-16 ENCOUNTER — Encounter (HOSPITAL_COMMUNITY): Payer: Self-pay | Admitting: Emergency Medicine

## 2016-04-16 ENCOUNTER — Other Ambulatory Visit: Payer: Self-pay

## 2016-04-16 ENCOUNTER — Emergency Department (HOSPITAL_COMMUNITY)
Admission: EM | Admit: 2016-04-16 | Discharge: 2016-04-16 | Disposition: A | Discharge status: Home / Self Care | Payer: Medicaid Other | Attending: Emergency Medicine | Admitting: Emergency Medicine

## 2016-04-16 DIAGNOSIS — R0602 Shortness of breath: Secondary | ICD-10-CM | POA: Insufficient documentation

## 2016-04-16 DIAGNOSIS — Z85118 Personal history of other malignant neoplasm of bronchus and lung: Secondary | ICD-10-CM | POA: Insufficient documentation

## 2016-04-16 DIAGNOSIS — I10 Essential (primary) hypertension: Secondary | ICD-10-CM | POA: Insufficient documentation

## 2016-04-16 DIAGNOSIS — Z79899 Other long term (current) drug therapy: Secondary | ICD-10-CM | POA: Diagnosis not present

## 2016-04-16 DIAGNOSIS — Z7982 Long term (current) use of aspirin: Secondary | ICD-10-CM | POA: Insufficient documentation

## 2016-04-16 DIAGNOSIS — Z794 Long term (current) use of insulin: Secondary | ICD-10-CM | POA: Diagnosis not present

## 2016-04-16 DIAGNOSIS — J45909 Unspecified asthma, uncomplicated: Secondary | ICD-10-CM | POA: Diagnosis not present

## 2016-04-16 DIAGNOSIS — J189 Pneumonia, unspecified organism: Secondary | ICD-10-CM | POA: Insufficient documentation

## 2016-04-16 DIAGNOSIS — E1140 Type 2 diabetes mellitus with diabetic neuropathy, unspecified: Secondary | ICD-10-CM | POA: Insufficient documentation

## 2016-04-16 DIAGNOSIS — R079 Chest pain, unspecified: Secondary | ICD-10-CM

## 2016-04-16 LAB — BASIC METABOLIC PANEL
Anion gap: 7 (ref 5–15)
BUN: 6 mg/dL (ref 6–20)
CALCIUM: 8.5 mg/dL — AB (ref 8.9–10.3)
CO2: 27 mmol/L (ref 22–32)
Chloride: 104 mmol/L (ref 101–111)
Creatinine, Ser: 0.4 mg/dL — ABNORMAL LOW (ref 0.44–1.00)
Glucose, Bld: 205 mg/dL — ABNORMAL HIGH (ref 65–99)
Potassium: 3.4 mmol/L — ABNORMAL LOW (ref 3.5–5.1)
SODIUM: 138 mmol/L (ref 135–145)

## 2016-04-16 LAB — I-STAT TROPONIN, ED
TROPONIN I, POC: 0 ng/mL (ref 0.00–0.08)
Troponin i, poc: 0 ng/mL (ref 0.00–0.08)

## 2016-04-16 LAB — CBC
HCT: 27.8 % — ABNORMAL LOW (ref 36.0–46.0)
Hemoglobin: 9.3 g/dL — ABNORMAL LOW (ref 12.0–15.0)
MCH: 28.4 pg (ref 26.0–34.0)
MCHC: 33.5 g/dL (ref 30.0–36.0)
MCV: 84.8 fL (ref 78.0–100.0)
PLATELETS: 233 10*3/uL (ref 150–400)
RBC: 3.28 MIL/uL — AB (ref 3.87–5.11)
RDW: 15.4 % (ref 11.5–15.5)
WBC: 7.8 10*3/uL (ref 4.0–10.5)

## 2016-04-16 LAB — I-STAT BETA HCG BLOOD, ED (MC, WL, AP ONLY)

## 2016-04-16 MED ORDER — LEVOFLOXACIN 500 MG PO TABS: 500.0000 mg | ORAL_TABLET | Freq: Every day | ORAL | 0 refills | Status: DC

## 2016-04-16 MED ORDER — IOPAMIDOL (ISOVUE-370) INJECTION 76%
100.0000 mL | Freq: Once | INTRAVENOUS | Status: AC | PRN
  Administered 2016-04-16: 100 mL via INTRAVENOUS

## 2016-04-16 MED ORDER — LEVOFLOXACIN 25 MG/ML PO SOLN: 500.0000 mg | Freq: Every day | ORAL | 0 refills | Status: AC

## 2016-04-16 MED ORDER — IOPAMIDOL (ISOVUE-370) INJECTION 76%
INTRAVENOUS | Status: AC
  Filled 2016-04-16: qty 100

## 2016-04-16 MED ORDER — LEVOFLOXACIN 750 MG PO TABS
750.0000 mg | ORAL_TABLET | Freq: Once | ORAL | Status: DC
  Filled 2016-04-16: qty 1

## 2016-04-16 MED ORDER — LEVOFLOXACIN 25 MG/ML PO SOLN
500.0000 mg | Freq: Every day | ORAL | Status: DC
  Filled 2016-04-16: qty 20

## 2016-04-16 MED ORDER — SODIUM CHLORIDE 0.9 % IV BOLUS (SEPSIS)
1000.0000 mL | Freq: Once | INTRAVENOUS | Status: AC
  Administered 2016-04-16: 1000 mL via INTRAVENOUS

## 2016-04-16 NOTE — ED Notes (Signed)
Waiting on liquid abx from pharmacy

## 2016-04-16 NOTE — Discharge Instructions (Signed)
Your CT shows potential residual cancer versus pneumonia. We will treat you with course of antibiotics.   Return without fail for worsening symptoms, including worsening pain, difficulty breathing, passing out, or any other symptoms concerning to you.  Please call Dr. Grier Mitts office and follow-up very closely for recheck.

## 2016-04-16 NOTE — ED Notes (Signed)
Nurse is in the room collecting labs

## 2016-04-16 NOTE — ED Triage Notes (Signed)
Pt complaint of left constant chest heaviness with associated SOB onset yesterday. Pt completed chemotherapy "Saturday before last;" hx of left lung cancer.

## 2016-04-16 NOTE — ED Provider Notes (Signed)
[facility] DEPT Provider Note   CSN: [id number] Arrival date & time: 04/16/16  4403     History   Chief Complaint Chief Complaint  Patient presents with  . Chest Pain    HPI Marilyn [name] is a 50 y.o. female.  HPI 51 year old female who presents with chest pain and shortness of breath. She has a history of stage III NSCLC s/p radiation and chemotherapy (04/01/2016), currently under observation with plans for restaging. History of chronic tachycardia, for which she takes Toprol XL 25 mg. States onset of chest pressure and shortness of breath starting yesterday while at rest. Symptoms seem to be worsened with ambulation and exertion. Symptoms not change with movement or palpation. Has had cough but no known fevers or chills. No leg swelling but does note myalgias and lower legs. Symptoms did not worsen lying back or sitting forward. Pain constant since onset. No alleviating factors.   Past Medical History:  Diagnosis Date  . Asthma   . Diabetes mellitus   . Hypercholesteremia   . Hypertension   . Lung cancer ([place])   . Neuropathy ([place])   . Obese     Patient Active Problem List   Diagnosis Date Noted  . Thrush of mouth and esophagus ([place]) 04/07/2016  . Constipation 04/07/2016  . Palliative care by specialist   . Port catheter in place 01/19/2016  . Dehydration 01/14/2016  . Tachycardia 01/14/2016  . Adenocarcinoma of left lung, stage 3 ([place]) 01/01/2016  . Non-small cell cancer of right lung ([place]) 12/31/2015  . Mediastinal mass   . Chest wall pain, chronic 12/26/2015  . Headache 12/26/2015  . Diabetes mellitus ([place]) 12/26/2015    Past Surgical History:  Procedure Laterality Date  . CESAREAN SECTION    . IR GENERIC HISTORICAL  01/08/2016   IR US GUIDE VASC ACCESS RIGHT 01/08/2016 [name], DO [facility]  . IR GENERIC HISTORICAL  01/08/2016   IR FLUORO GUIDE CV LINE RIGHT 01/08/2016 [name], DO [facility]  . TUBAL LIGATION    . US guided core needle  biopsy     of right lower neck/supraclavicular lymph nodes    OB History    No data available       Home Medications    Prior to Admission medications   Medication Sig Start Date End Date Taking? Authorizing Provider  aspirin EC 81 MG tablet Take 1 tablet (81 mg total) by mouth daily. 01/16/16  Yes [name], MD  chlorhexidine (PERIDEX) 0.12 % solution Use as directed 15 mLs in the mouth or throat 2 (two) times daily. Patient taking differently: Use as directed 15 mLs in the mouth or throat 2 (two) times daily as needed (mouth pain).  01/05/16  Yes Comerio, MD  EQ ALLERGY RELIEF CHILDRENS 12.5 MG/5ML liquid Take 12.5 mg by mouth daily as needed for allergies.  01/27/16  Yes Historical Provider, MD  fluconazole (DIFLUCAN) 100 MG tablet Take 1 tablet (100 mg total) by mouth daily. 04/07/16  Yes Susanne Borders, NP  Fluocinonide 0.1 % CREA Apply 1 application topically daily.  01/06/16  Yes Historical Provider, MD  HYDROcodone-acetaminophen (HYCET) 7.5-325 mg/15 ml solution Take 15 mLs by mouth 4 (four) times daily as needed for moderate pain. 02/06/16 02/05/17 Yes Gautam Juleen China, MD  insulin aspart (NOVOLOG) 100 UNIT/ML FlexPen As per sliding scale instructions Patient taking differently: Inject 0-20 Units into the skin 3 (three) times daily as needed for high blood sugar. As per  sliding scale instructions 01/16/16  Yes [name], MD  Insulin Degludec (TRESIBA FLEXTOUCH) 200 UNIT/ML SOPN Inject 100 Units into the skin daily before breakfast.    Yes Historical Provider, MD  magic mouthwash w/lidocaine SOLN Take 5 mLs by mouth 4 (four) times daily as needed for mouth pain. 01/23/16  Yes [name], MD  metoprolol succinate (TOPROL-XL) 25 MG 24 hr tablet Take 1 tablet (25 mg total) by mouth daily. 03/12/16  Yes Susanne Borders, NP  promethazine (PHENERGAN) 25 MG tablet Take 1 tablet (25 mg total) by mouth every 6 (six) hours as needed for nausea or  vomiting. 03/17/16  Yes Elmyra Ricks Pisciotta, PA-C  sucralfate (CARAFATE) 1 g tablet Take 1 tablet (1 g total) by mouth 4 (four) times daily -  with meals and at bedtime. 5 min before meals for radiation induced esophagitis Patient taking differently: Take 1 g by mouth 4 (four) times daily as needed (stomach coating). 5 min before meals for radiation induced esophagitis 01/22/16  Yes Tyler Pita, MD  Wound Dressings (SONAFINE) Apply 1 application topically 2 (two) times daily.   Yes Historical Provider, MD  levofloxacin (LEVAQUIN) 500 MG tablet Take 1 tablet (500 mg total) by mouth daily. 04/16/16   Forde Dandy, MD  nystatin (MYCOSTATIN) 100000 UNIT/ML suspension Take 5 mLs (500,000 Units total) by mouth 4 (four) times daily. Patient not taking: Reported on 04/16/2016 03/18/16   [name], MD    Family History Family History  Problem Relation Age of Onset  . Cancer Neg Hx   . Rheumatologic disease Neg Hx     Social History Social History  Substance Use Topics  . Smoking status: Never Smoker  . Smokeless tobacco: Never Used  . Alcohol use No     Allergies   Patient has no known allergies.   Review of Systems Review of Systems 10/14 systems reviewed and are negative other than those stated in the HPI   Physical Exam Updated Vital Signs BP 122/87   Pulse 111   Temp 98 F (36.7 C) (Oral)   Resp 16   Ht '5\' 9"'$  (1.753 m)   Wt 257 lb (116.6 kg)   LMP 01/21/2016 Comment: pt signed preg test waiver 04/16/16   SpO2 100%   BMI 37.95 kg/m   Physical Exam Physical Exam  Nursing note and vitals reviewed. Constitutional: chronically ill appearing, non-toxic, and in no acute distress Head: Normocephalic and atraumatic.  Mouth/Throat: Oropharynx is clear and moist.  Neck: Normal range of motion. Neck supple.  Cardiovascular: Tachycardic rate and regular rhythm.  no LE edema. Pulmonary/Chest: Effort normal and breath sounds normal. no conversational dyspnea. Abdominal:  Soft. There is no tenderness. There is no rebound and no guarding.  Musculoskeletal: Normal range of motion.  Neurological: Alert, no facial droop, fluent speech, moves all extremities symmetrically Skin: Skin is warm and dry.  Psychiatric: Cooperative   ED Treatments / Results  Labs (all labs ordered are listed, but only abnormal results are displayed) Labs Reviewed  BASIC METABOLIC PANEL - Abnormal; Notable for the following:       Result Value   Potassium 3.4 (*)    Glucose, Bld 205 (*)    Creatinine, Ser 0.40 (*)    Calcium 8.5 (*)    All other components within normal limits  CBC - Abnormal; Notable for the following:    RBC 3.28 (*)    Hemoglobin 9.3 (*)    HCT 27.8 (*)  All other components within normal limits  I-STAT TROPOININ, ED  I-STAT BETA HCG BLOOD, ED ([facility], [facility], [facility] ONLY)  I-STAT TROPOININ, ED    EKG  EKG Interpretation None       Radiology Dg Chest 2 View  Result Date: 04/16/2016 CLINICAL DATA:  Chest pain and shortness of breath EXAM: CHEST  2 VIEW COMPARISON:  April 07, 2016 FINDINGS: The lungs are clear. The heart size and pulmonary vascularity are normal. No adenopathy. Central catheter tip is in the superior vena cava. No pneumothorax. There is mid thoracic levoscoliosis. IMPRESSION: No edema or consolidation. Central catheter tip in superior vena cava. No pneumothorax. Electronically Signed   By: Lowella Grip III M.D.   On: 04/16/2016 09:52   Ct Angio Chest Pe W And/or Wo Contrast  Result Date: 04/16/2016 CLINICAL DATA:  51 year old female who presents with chest pain and shortness of breath. She has a history of stage III NSCLC s/p radiation and chemotherapy (04/01/2016), currently under observation with plans for restaging. History of chronic tachycardia, for which she takes Toprol XL 25 mg. States onset of chest pressure and shortness of breath starting yesterday while at rest. Symptoms seem to be worsened with ambulation and exertion. Symptoms  not change with movement or palpation. Has had cough but no known fevers or chills. No leg swelling but does note myalgias and lower legs. Symptoms did not worsen lying back or sitting forward. Pain constant since onset. No alleviating factors. EXAM: CT ANGIOGRAPHY CHEST WITH CONTRAST TECHNIQUE: Multidetector CT imaging of the chest was performed using the standard protocol during bolus administration of intravenous contrast. Multiplanar CT image reconstructions and MIPs were obtained to evaluate the vascular anatomy. CONTRAST:  100 mL of Isovue 370 intravenous contrast COMPARISON:  Current chest radiograph. Chest CT, 12/26/2015. PET-CT, 01/12/2016. FINDINGS: Cardiovascular: There is satisfactory opacification the pulmonary arteries. Some mild motion artifact limits assessment of the lower segmental and subsegmental pulmonary vessels. Allowing for this mild limitation, there is no evidence of a pulmonary embolus. The heart is normal in size and configuration. No coronary artery calcifications. The great vessels are normal in caliber. No aortic dissection or plaque. Branch vessels are widely patent. Mediastinum/Nodes: Soft tissue mass consistent with the patient's known carcinoma extends from the right superior hilum along the right peritracheal superior mediastinum, abutting the posterior aspect of the superior vena cava and contacting the ascending aorta. It has decreased in size when compared to the prior chest CT, currently measuring 6.8 x 3.7 x 2.2 cm, previously approximately 7.2 x 3.9 x 5.7 cm. No other mediastinal masses. No hilar masses or enlarged lymph nodes. Supraclavicular adenopathy noted on the prior PET-CT has significantly improved. There are no enlarged supraclavicular or axillary lymph nodes. Lungs/Pleura: There is a new focal area of ground-glass opacity in the right upper lobe, centered on image 30, series 7, measuring 16 x 11 cm transversely. In the superior segment of the right upper lobe,  along the posterior margin, there is another focal area of airspace opacity, increased when compared to the prior CT. Minor subsegmental atelectasis is noted in the posterior lower lobes. The remainder of the lungs is clear. No pleural effusion. No pneumothorax. Upper Abdomen: No liver or visible adrenal masses. No acute findings. Musculoskeletal: No acute finding. No osteoblastic or osteolytic lesions. Review of the MIP images confirms the above findings. IMPRESSION: 1. No evidence of a pulmonary embolism. 2. New, 16 mm, focal area of ground-glass opacity in the right upper lobe. This may reflect  metastatic disease. It may be inflammatory/infectious in origin. 3. There is a less well-defined but otherwise similar appearing area of opacity in the superior segment right lower lobe, which has increased from the prior chest CT. This also may reflect metastatic disease or be infectious or inflammatory in etiology. 4. There has been interval improvement in the lung carcinoma. The bulky right paratracheal mass has decreased in size. The supraclavicular adenopathy has significantly decreased with no residual enlarged lymph nodes. Electronically Signed   By: Lajean Manes M.D.   On: 04/16/2016 12:02    Procedures Procedures (including critical care time)  Medications Ordered in ED Medications  iopamidol (ISOVUE-370) 76 % injection (not administered)  levofloxacin (LEVAQUIN) tablet 750 mg (not administered)  iopamidol (ISOVUE-370) 76 % injection 100 mL (100 mLs Intravenous Contrast Given 04/16/16 1135)  sodium chloride 0.9 % bolus 1,000 mL (0 mLs Intravenous Stopped 04/16/16 1452)     Initial Impression / Assessment and Plan / ED Course  I have reviewed the triage vital signs and the nursing notes.  Pertinent labs & imaging results that were available during my care of the patient were reviewed by me and considered in my medical decision making (see chart for details).  Clinical Course     51 year old  female with stage 3 lung cancer s/p chemoradiation therapy who presents with dyspnea and chest pressure. History of chronic tachycardia, on metoprolol. On chart review, tachycardia today is baseline for her. She is breathing comfortably on room air, speaking in full sentences. CXR visualized and w/o acute cardiopulmonary processes. She subsequently underwent CT angiogram of the chest to evaluate for PE versus pneumonia versus edema versus worsening malignancy versus other acute intrathoracic processes. The visualized. No evidence of PE. Overall burden of malignancy is decreased. Does have right upper lobe and right lower lobe opacity that could be malignancy versus pneumonia. She has minimal cough but no fevers, leukocytosis, or other infectious symptoms. Given recent chemoradiation therapy will start on course of Levaquin for potential pneumonia. Serial troponins are negative and EKG does not show acute ischemia and unchanged from prior. She does have some risk factors including radiation therapy, diabetes and hypertension. She is still heart score of 3, and discussed that she will continue outpatient workup with her physician. Case discussed with Dr. Marin Olp. Agrees with levaquin and close follow-up with PCP or Dr. Irene Limbo. Discussed plan with patient. Strict return and follow-up instructions reviewed. She expressed understanding of all discharge instructions and felt comfortable with the plan of care.   Final Clinical Impressions(s) / ED Diagnoses   Final diagnoses:  Nonspecific chest pain  Healthcare-associated pneumonia    New Prescriptions New Prescriptions   LEVOFLOXACIN (LEVAQUIN) 500 MG TABLET    Take 1 tablet (500 mg total) by mouth daily.     Forde Dandy, MD 04/16/16 [phone]

## 2016-04-16 NOTE — ED Notes (Signed)
Informed MD that patient unable to take pills since therapy.  MD changed RX and first dosage

## 2016-04-16 NOTE — ED Notes (Signed)
Pulse Ox prior to ambulation: 100% Pulse Ox during ambulation: 96%  Pt c/o dizziness during ambulation.

## 2016-04-16 NOTE — ED Notes (Signed)
Bed: WA17 Expected date:  Expected time:  Means of arrival:  Comments: Pt in 41

## 2016-04-22 ENCOUNTER — Ambulatory Visit (HOSPITAL_COMMUNITY): Payer: Medicaid Other

## 2016-04-23 ENCOUNTER — Telehealth: Payer: Self-pay | Admitting: *Deleted

## 2016-04-23 NOTE — Telephone Encounter (Signed)
Patient called and added a flush appt to her appts on Monday. Patient aware on new time

## 2016-04-26 ENCOUNTER — Other Ambulatory Visit (HOSPITAL_BASED_OUTPATIENT_CLINIC_OR_DEPARTMENT_OTHER): Payer: Medicaid Other

## 2016-04-26 ENCOUNTER — Ambulatory Visit: Payer: Medicaid Other

## 2016-04-26 ENCOUNTER — Ambulatory Visit (HOSPITAL_BASED_OUTPATIENT_CLINIC_OR_DEPARTMENT_OTHER): Payer: Medicaid Other | Admitting: Hematology

## 2016-04-26 VITALS — BP 119/76 | HR 125 | Temp 98.3°F | Resp 20 | Ht 69.0 in | Wt 258.0 lb

## 2016-04-26 DIAGNOSIS — C3491 Malignant neoplasm of unspecified part of right bronchus or lung: Secondary | ICD-10-CM

## 2016-04-26 DIAGNOSIS — D6481 Anemia due to antineoplastic chemotherapy: Secondary | ICD-10-CM | POA: Diagnosis not present

## 2016-04-26 DIAGNOSIS — C3492 Malignant neoplasm of unspecified part of left bronchus or lung: Secondary | ICD-10-CM

## 2016-04-26 DIAGNOSIS — C770 Secondary and unspecified malignant neoplasm of lymph nodes of head, face and neck: Secondary | ICD-10-CM

## 2016-04-26 DIAGNOSIS — Z95828 Presence of other vascular implants and grafts: Secondary | ICD-10-CM

## 2016-04-26 DIAGNOSIS — R630 Anorexia: Secondary | ICD-10-CM

## 2016-04-26 DIAGNOSIS — R000 Tachycardia, unspecified: Secondary | ICD-10-CM | POA: Diagnosis not present

## 2016-04-26 DIAGNOSIS — E279 Disorder of adrenal gland, unspecified: Secondary | ICD-10-CM

## 2016-04-26 DIAGNOSIS — R530 Neoplastic (malignant) related fatigue: Secondary | ICD-10-CM

## 2016-04-26 DIAGNOSIS — G893 Neoplasm related pain (acute) (chronic): Secondary | ICD-10-CM | POA: Diagnosis not present

## 2016-04-26 DIAGNOSIS — K208 Other esophagitis: Secondary | ICD-10-CM | POA: Diagnosis not present

## 2016-04-26 DIAGNOSIS — E0800 Diabetes mellitus due to underlying condition with hyperosmolarity without nonketotic hyperglycemic-hyperosmolar coma (NKHHC): Secondary | ICD-10-CM

## 2016-04-26 DIAGNOSIS — E1165 Type 2 diabetes mellitus with hyperglycemia: Secondary | ICD-10-CM | POA: Diagnosis not present

## 2016-04-26 DIAGNOSIS — I10 Essential (primary) hypertension: Secondary | ICD-10-CM | POA: Diagnosis not present

## 2016-04-26 LAB — COMPREHENSIVE METABOLIC PANEL
ALBUMIN: 2.7 g/dL — AB (ref 3.5–5.0)
ALK PHOS: 74 U/L (ref 40–150)
ALT: 7 U/L (ref 0–55)
AST: 10 U/L (ref 5–34)
Anion Gap: 11 mEq/L (ref 3–11)
BUN: 4.7 mg/dL — AB (ref 7.0–26.0)
CALCIUM: 8.7 mg/dL (ref 8.4–10.4)
CHLORIDE: 107 meq/L (ref 98–109)
CO2: 24 mEq/L (ref 22–29)
Creatinine: 0.7 mg/dL (ref 0.6–1.1)
EGFR: >90 mL/min/{1.73_m2} (ref >90)
Glucose: 195 mg/dl — ABNORMAL HIGH (ref 70–140)
POTASSIUM: 3.6 meq/L (ref 3.5–5.1)
Sodium: 142 mEq/L (ref 136–145)
Total Bilirubin: 0.36 mg/dL (ref 0.20–1.20)
Total Protein: 6.9 g/dL (ref 6.4–8.3)

## 2016-04-26 LAB — CBC & DIFF AND RETIC
BASO%: 0.2 % (ref 0.0–2.0)
BASOS ABS: 0 10*3/uL (ref 0.0–0.1)
EOS%: 0.8 % (ref 0.0–7.0)
Eosinophils Absolute: 0 10*3/uL (ref 0.0–0.5)
HEMATOCRIT: 26.5 % — AB (ref 34.8–46.6)
HEMOGLOBIN: 8.8 g/dL — AB (ref 11.6–15.9)
Immature Retic Fract: 5.4 % (ref 1.60–10.00)
LYMPH%: 15.1 % (ref 14.0–49.7)
MCH: 29 pg (ref 25.1–34.0)
MCHC: 33.2 g/dL (ref 31.5–36.0)
MCV: 87.5 fL (ref 79.5–101.0)
MONO#: 0.5 10*3/uL (ref 0.1–0.9)
MONO%: 9.9 % (ref 0.0–14.0)
NEUT%: 74 % (ref 38.4–76.8)
NEUTROS ABS: 3.7 10*3/uL (ref 1.5–6.5)
Platelets: 260 10*3/uL (ref 145–400)
RBC: 3.03 10*6/uL — ABNORMAL LOW (ref 3.70–5.45)
RDW: 15 % — AB (ref 11.2–14.5)
RETIC %: 2.63 % — AB (ref 0.70–2.10)
Retic Ct Abs: 79.69 10*3/uL (ref 33.70–90.70)
WBC: 5 10*3/uL (ref 3.9–10.3)
lymph#: 0.8 10*3/uL — ABNORMAL LOW (ref 0.9–3.3)

## 2016-04-26 IMAGING — CT CT ANGIO NECK
1 of 14 series · 1 of 33 positions shown · IV contrast (Iohexol (Omnipaque 350))
Comparison: CT head April 25, 2011 and CT cervical spine October 19, 2014

CLINICAL DATA: Headache and chest pain for 2 weeks. Hypertensive.
History of hypertension, diabetes, hypercholesterolemia. No trauma.

EXAM:
CT HEAD WITHOUT CONTRAST
CT ANGIOGRAPHY OF THE HEAD
TECHNIQUE: Contiguous axial images were obtained from the base of the skull
through the vertex without intravenous contrast. Multidetector CT
imaging of the head was performed using the standard protocol during
bolus administration of intravenous contrast. Multiplanar CT image
reconstructions and MIPs were obtained to evaluate the vascular
anatomy.
CONTRAST:  50mL OMNIPAQUE IOHEXOL 350 MG/ML SOLN, 100mL OMNIPAQUE
IOHEXOL 350 MG/ML SOLN

[Series 300: locator · axial · 0.49mm/px · 1 of 1 slices shown]
[im 1/1  soft-tissue]
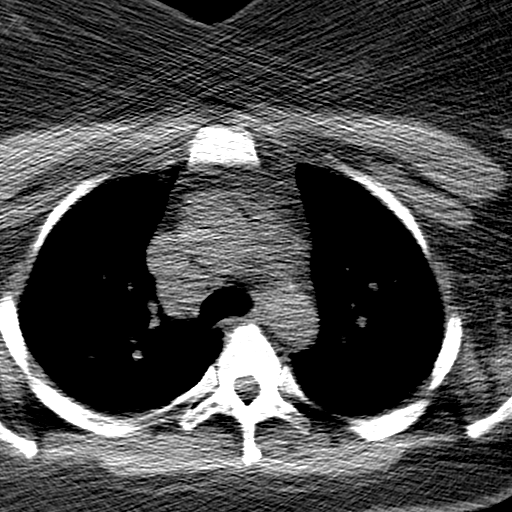

[1 of 33 positions shown; findings below may reference images not displayed]

FINDINGS: CT HEAD WITHOUT CONTRAST

The ventricles and sulci are normal. No intraparenchymal hemorrhage,
mass effect nor midline shift. No acute large vascular territory
infarcts.

No abnormal extra-axial fluid collections. Basal cisterns are
patent.

No skull fracture. The included ocular globes and orbital contents
are non-suspicious. The mastoid aircells and included paranasal
sinuses are well-aerated.

CT ANGIOGRAPHY OF THE HEAD

Aortic arch: Normal appearance of the thoracic arch, normal branch
pattern. The origins of the innominate, left Common carotid artery
and subclavian artery are widely patent.

Right carotid system: Common carotid artery is widely patent,
coursing in a straight line fashion. Eccentric intimal thickening
results in LEFT than 50% stenosis RIGHT internal carotid artery
origin. Normal appearance of the included internal carotid artery.

Left carotid system: Common carotid artery is widely patent,
coursing in a straight line fashion. Eccentric intimal thickening
results in 7 D percent stenosis LEFT internal carotid artery origin.
Normal appearance of the included internal carotid artery.

Vertebral arteries:Bilateral vertebral arteries are patent. Habitus
limited examination resulting an streak artifact through the neck,
with mild luminal irregularity of the RIGHT greater than LEFT
vertebral arteries.

Skeleton: No acute osseous process though bone windows have not been
submitted. Mild C5-6 disc height loss, ventral endplate spurring
compatible with degenerative disc, stable from prior CT. Poor
dentition with multiple dental caries, periapical lucency/abscess
and absent teeth.

Other neck: Soft tissues of the neck are nonacute though, not
tailored for evaluation.
IMPRESSION: CT HEAD:  No acute intracranial process.  Negative CT head for age.

CTA NECK: Approximately 70% stenosis LEFT internal carotid artery
origin. Less than 50% stenosis RIGHT internal carotid artery origin.

Mild luminal irregularity of the RIGHT greater LEFT vertebral
artery's which may represent artifact, atherosclerosis or old
injury. No flow limiting stenosis of the vertebral arteries.

## 2016-04-26 IMAGING — CT CT ANGIO CHEST
1 of 9 series · 1 of 36 positions shown · IV contrast (Iohexol (Omnipaque 350))
Comparison: None.

CLINICAL DATA: Chest pain for 2 weeks

EXAM:
CT ANGIOGRAPHY CHEST WITH CONTRAST
TECHNIQUE: Multidetector CT imaging of the chest was performed using the
standard protocol during bolus administration of intravenous
contrast. Multiplanar CT image reconstructions and MIPs were
obtained to evaluate the vascular anatomy.
CONTRAST:  100mL OMNIPAQUE IOHEXOL 350 MG/ML SOLN

[Series 300: locator · axial · 0.68mm/px · 1 of 1 slices shown]
[im 1/1  lung]
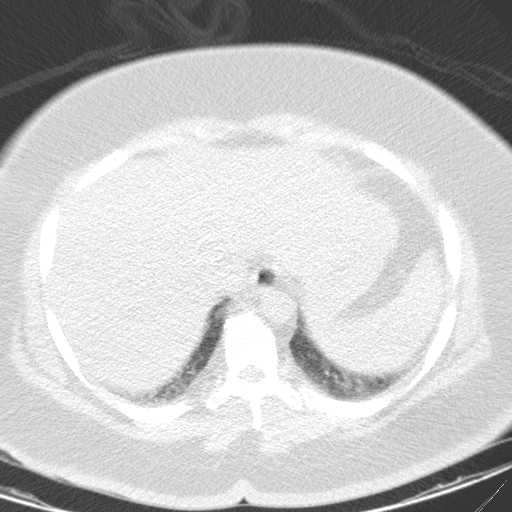

[1 of 36 positions shown; findings below may reference images not displayed]

FINDINGS: The lungs are well aerated bilaterally. Some minimal dependent
changes are noted in the posterior right lower lobe best seen on
image number 23 of series 506. Some associated small lymph nodes are
noted in the right hilum measuring approximately 13 mm. Additionally
a 19 mm short axis lymph node is noted in the right peritracheal
region.

The thoracic inlet is within normal limits. The thoracic aorta and
its branches are unremarkable. No aneurysm or dissection is
identified. The pulmonary artery shows a normal branching pattern.
No filling defect to suggest pulmonary embolism is noted.

The visualized upper abdomen shows evidence contrast material within
the kidneys bilaterally from a recent CT angiogram.

Review of the MIP images confirms the above findings.
IMPRESSION: No evidence of pulmonary emboli or aortic dissection.

Minimal infiltrative changes with associated hilar and mediastinal
adenopathy. These changes could be related to underlying sarcoidosis
or simply related to acute inflammatory change. Short-term followup
in 3-6 months may be helpful. Additionally clinical evaluation could
be performed.

## 2016-04-26 IMAGING — CR DG CHEST 1V PORT
1 series · 1 of 1 positions shown · non-contrast
Comparison: 10/07/2014

CLINICAL DATA: Chest pain, left arm pain for 2 days

EXAM:
PORTABLE CHEST 1 VIEW

[AP]
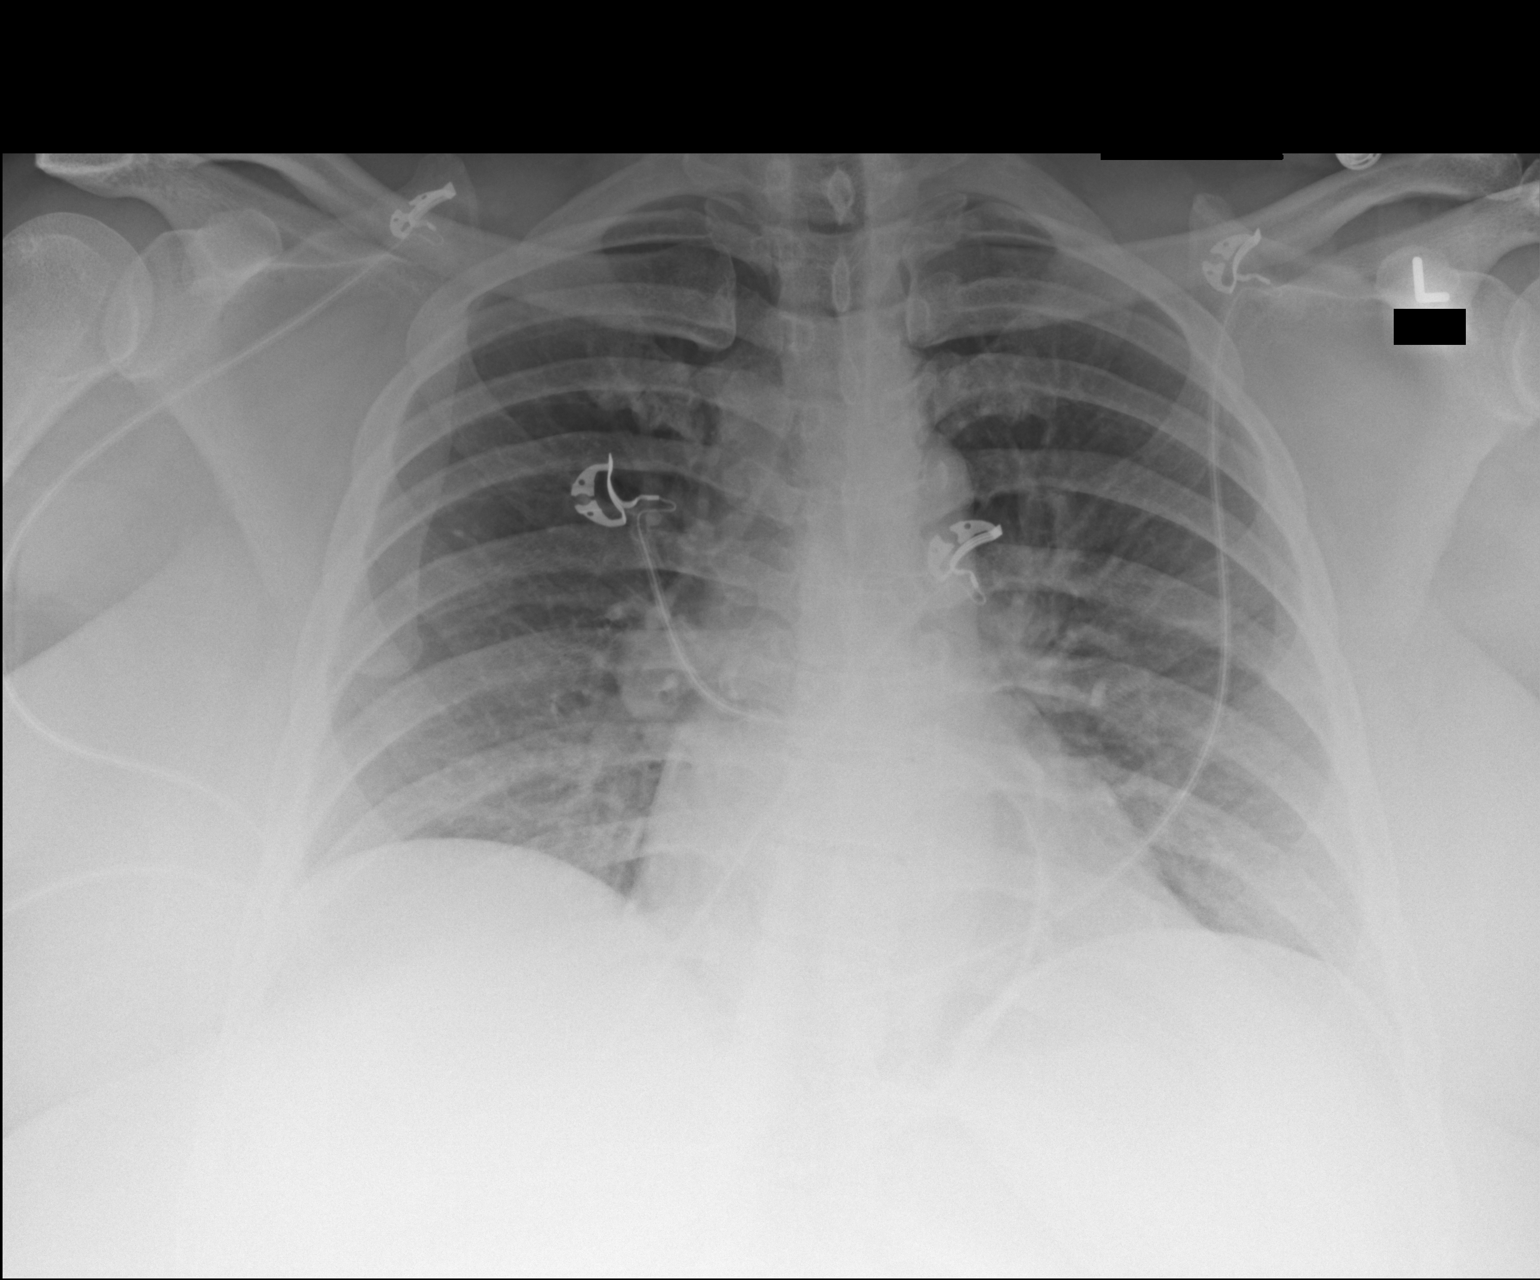

[1 of 1 positions shown; findings below may reference images not displayed]

FINDINGS: The heart size and mediastinal contours are within normal limits.
Both lungs are clear. The visualized skeletal structures are
unremarkable.
IMPRESSION: No active disease.

## 2016-04-26 MED ORDER — SODIUM CHLORIDE 0.9 % IJ SOLN
10.0000 mL | INTRAMUSCULAR | Status: DC | PRN
  Administered 2016-04-26: 10 mL via INTRAVENOUS
  Filled 2016-04-26: qty 10

## 2016-04-26 MED ORDER — HEPARIN SOD (PORK) LOCK FLUSH 100 UNIT/ML IV SOLN
500.0000 [IU] | Freq: Once | INTRAVENOUS | Status: AC | PRN
  Administered 2016-04-26: 250 [IU] via INTRAVENOUS
  Filled 2016-04-26: qty 5

## 2016-04-26 NOTE — Progress Notes (Signed)
DISCONTINUE ON PATHWAY REGIMEN - Non-Small Cell Lung  LOS23: Carboplatin AUC=6 + Paclitaxel 200 mg/m2 q21 Days x 2-3 Cycles   A cycle is every 21 days:     Paclitaxel (Taxol(R)) 200 mg/m2 in 500 mL NS IV over 3 hours followed by Dose Mod: None     Carboplatin (Paraplatin(R)) AUC=6 in 250 mL NS IV over 1 hour Dose Mod: None Additional Orders: * All AUC calculations intended to be used in [employer] formula  **Always confirm dose/schedule in your pharmacy ordering system**    REASON: Continuation Of Treatment PRIOR TREATMENT: LOS23: Carboplatin AUC=6 + Paclitaxel 200 mg/m2 q21 Days x 2-3 Cycles TREATMENT RESPONSE: Partial Response (PR)  START OFF PATHWAY REGIMEN - Non-Small Cell Lung  Off Pathway: Durvalumab 10 mg/kg q14 Days  [id number]:Durvalumab 10 mg/kg q14 Days:   A cycle is every 14 days:     Durvalumab (Imfinzi(TM)) 10 mg/kg in 250 mL NS IV over 60 minutes on day 1 only of each cycle. Dose Mod: None Additional Orders: Monitor for infusion reactions; interrupt or slow infusion for grade 1 or 2 reaction occurs and consider premedication for subsequent infusion; discontinue for grade 3 or 4 infusion reaction.  See prescribing information for dose interruption guidelines and management of toxicity. Ref: [facility] [phone], 2016.  **Always confirm dose/schedule in your pharmacy ordering system**    Patient Characteristics: Stage III - Unresectable, PS = 0, 1 AJCC T Category: TX Current Disease Status: No Distant Mets or Local Recurrence AJCC N Category: NX AJCC M Category: M0 AJCC 8 Stage Grouping: IIIB Performance Status: PS = 0, 1  Intent of Therapy: Non-Curative / Palliative Intent, Discussed with Patient

## 2016-04-26 NOTE — Progress Notes (Signed)
DISCONTINUE ON PATHWAY REGIMEN - Non-Small Cell Lung  [id number]: Carboplatin AUC=2 + Paclitaxel 45 mg/m2 Weekly During Radiation   Administer weekly:     Paclitaxel (Taxol(R)) 45 mg/m2 in 250 mL NS IV over 1 hour followed by Dose Mod: None     Carboplatin (Paraplatin(R)) AUC=2 in 100 mL NS IV over 30 minutes Dose Mod: None Additional Orders: * All AUC calculations intended to be used in [employer] formula  **Always confirm dose/schedule in your pharmacy ordering system**    REASON: Continuation Of Treatment PRIOR TREATMENT: [id number]: Carboplatin AUC=2 + Paclitaxel 45 mg/m2 Weekly During Radiation TREATMENT RESPONSE: Partial Response (PR)  START ON PATHWAY REGIMEN - Non-Small Cell Lung  LOS23: Carboplatin AUC=6 + Paclitaxel 200 mg/m2 q21 Days x 2-3 Cycles   A cycle is every 21 days:     Paclitaxel (Taxol(R)) 200 mg/m2 in 500 mL NS IV over 3 hours followed by Dose Mod: None     Carboplatin (Paraplatin(R)) AUC=6 in 250 mL NS IV over 1 hour Dose Mod: None Additional Orders: * All AUC calculations intended to be used in [employer] formula  **Always confirm dose/schedule in your pharmacy ordering system**    Patient Characteristics: Stage III - Unresectable, PS = 0, 1 Performance Status: PS = 0, 1  Intent of Therapy: Non-Curative / Palliative Intent, Discussed with Patient

## 2016-04-26 NOTE — Progress Notes (Signed)
Marilyn [name]    HEMATOLOGY/ONCOLOGY CLINIC NOTE  Date of Service: .04/26/2016  Patient Care Team: Nolene Ebbs, MD as PCP - General (Internal Medicine)  CHIEF COMPLAINTS/PURPOSE OF CONSULTATION:  Newly diagnosed Lung Adenocarcinoma.  Diagnosis Stage IIIB Lung Adenocarcinoma  Treatment -Concurrent Chemo-radiation with Carboplatin/Taxol. Completed RT on 02/13/2016 and has then completed 2 cycles of carboplatin + taxol.  HISTORY OF PRESENTING ILLNESS: Plz see my previous note for details on initial presentation.  INTERVAL HISTORY  Patient is here for her scheduled followup after completion of her planned chemotherapy. She presented to the emergency room on 04/16/2016 with some costochondral tenderness and some shortness of breath. Troponins were negative EKG was negative pain was thought to be related to costochondritis. Patient had a CTA of the chest which ruled out PE, showed improvement in her right paratracheal mass couple of changes in her lungs possibly from radiation pneumonitis versus early pneumonia. Patient was discharged on levofloxacin and has couple of days of antibiotic remaining. She notes that her appetite is significantly improved and that her fatigue from the chemotherapy has improved as well. She has some grade 1 tingling and numbness in her hands and feet which are also improving. No other acute new focal symptoms. Encouraged to drink more water and get more physically active. Discussed in detail the option and role for considering consolidation treatment with maintenance Durvalumab and she was given the amount of inflammation regarding this medication and the available data. She is agreeable to proceed with this.     MEDICAL HISTORY:  Past Medical History:  Diagnosis Date  . Asthma   . Diabetes mellitus   . Hypercholesteremia   . Hypertension   . Lung cancer ([place])   . Neuropathy ([place])   . Obese     SURGICAL HISTORY: Past Surgical History:  Procedure Laterality Date   . CESAREAN SECTION    . IR GENERIC HISTORICAL  01/08/2016   IR US GUIDE VASC ACCESS RIGHT 01/08/2016 [name], DO [facility]  . IR GENERIC HISTORICAL  01/08/2016   IR FLUORO GUIDE CV LINE RIGHT 01/08/2016 [name], DO [facility]  . TUBAL LIGATION    . US guided core needle biopsy     of right lower neck/supraclavicular lymph nodes    SOCIAL HISTORY: Social History   Social History  . Marital status: Divorced    Spouse name: N/A  . Number of children: N/A  . Years of education: N/A   Occupational History  . Not on file.   Social History Main Topics  . Smoking status: Never Smoker  . Smokeless tobacco: Never Used  . Alcohol use No  . Drug use: No  . Sexual activity: Not Currently   Other Topics Concern  . Not on file   Social History Narrative   No bird or mold exposure. No recent travel.    FAMILY HISTORY: Family History  Problem Relation Age of Onset  . Cancer Neg Hx   . Rheumatologic disease Neg Hx     ALLERGIES:  has No Known Allergies.  MEDICATIONS:  Current Outpatient Prescriptions  Medication Sig Dispense Refill  . aspirin EC 81 MG tablet Take 1 tablet (81 mg total) by mouth daily. 60 tablet 2  . chlorhexidine (PERIDEX) 0.12 % solution Use as directed 15 mLs in the mouth or throat 2 (two) times daily. (Patient taking differently: Use as directed 15 mLs in the mouth or throat 2 (two) times daily as needed (mouth pain). ) 120 mL 0  .  EQ ALLERGY RELIEF CHILDRENS 12.5 MG/5ML liquid Take 12.5 mg by mouth daily as needed for allergies.     . fluconazole (DIFLUCAN) 100 MG tablet Take 1 tablet (100 mg total) by mouth daily. 14 tablet 1  . Fluocinonide 0.1 % CREA Apply 1 application topically daily.     [name] HYDROcodone-acetaminophen (HYCET) 7.5-325 mg/15 ml solution Take 15 mLs by mouth 4 (four) times daily as needed for moderate pain. 473 mL 0  . insulin aspart (NOVOLOG) 100 UNIT/ML FlexPen As per sliding scale instructions (Patient taking differently:  Inject 0-20 Units into the skin 3 (three) times daily as needed for high blood sugar. As per sliding scale instructions) 15 mL 2  . Insulin Degludec (TRESIBA FLEXTOUCH) 200 UNIT/ML SOPN Inject 100 Units into the skin daily before breakfast.     . levofloxacin (LEVAQUIN) 25 MG/ML solution Take 20 mLs (500 mg total) by mouth daily. 200 mL 0  . levofloxacin (LEVAQUIN) 500 MG tablet Take 1 tablet (500 mg total) by mouth daily. 10 tablet 0  . magic mouthwash w/lidocaine SOLN Take 5 mLs by mouth 4 (four) times daily as needed for mouth pain. 400 mL 1  . metoprolol succinate (TOPROL-XL) 25 MG 24 hr tablet Take 1 tablet (25 mg total) by mouth daily. 30 tablet 1  . nystatin (MYCOSTATIN) 100000 UNIT/ML suspension Take 5 mLs (500,000 Units total) by mouth 4 (four) times daily. (Patient not taking: Reported on 04/16/2016) 200 mL 0  . promethazine (PHENERGAN) 25 MG tablet Take 1 tablet (25 mg total) by mouth every 6 (six) hours as needed for nausea or vomiting. 12 tablet 0  . sucralfate (CARAFATE) 1 g tablet Take 1 tablet (1 g total) by mouth 4 (four) times daily -  with meals and at bedtime. 5 min before meals for radiation induced esophagitis (Patient taking differently: Take 1 g by mouth 4 (four) times daily as needed (stomach coating). 5 min before meals for radiation induced esophagitis) 120 tablet 2  . Wound Dressings (SONAFINE) Apply 1 application topically 2 (two) times daily.     No current facility-administered medications for this visit.     REVIEW OF SYSTEMS:    10 Point review of Systems was done is negative except as noted above.  PHYSICAL EXAMINATION: ECOG PERFORMANCE STATUS: 1 - Symptomatic but completely ambulatory  . Vitals:   04/26/16 1236  BP: 119/76  Pulse: (!) 125  Resp: 20  Temp: 98.3 F (36.8 C)   Filed Weights   04/26/16 1236  Weight: 258 lb (117 kg)   .Body mass index is 38.1 kg/m.  GENERAL:alert, in no acute distress and comfortable SKIN: radiation related skin  injury over neck - healing EYES: normal, conjunctiva are pink and non-injected, sclera clear OROPHARYNX:oral thrush noted NECK: supple, no JVD, thyroid normal size, non-tender, without nodularity LYMPH:  no palpable lymphadenopathy in the cervical, axillary or inguinal.             LUNGS: clear to auscultation with normal respiratory effort. Distant breath sounds  HEART: regular rate & rhythm,  no murmurs and no lower extremity edema ABDOMEN: abdomen obese, soft, non-tender, normoactive bowel sounds  Musculoskeletal: no cyanosis of digits and no clubbing  PSYCH: alert & oriented x 3 with fluent speech NEURO: no focal motor/sensory deficits  LABORATORY DATA: . CBC Latest Ref Rng & Units 03/18/2016 03/17/2016  WBC 3.9 - 10.3 10e3/uL 7.2 -  Hemoglobin 11.6 - 15.9 g/dL 11.2(L) 10.9(L)  Hematocrit 34.8 - 46.6 % 33.6(L) 32.0(L)  Platelets 145 - 400 10e3/uL 195 -   . CMP Latest Ref Rng & Units 03/18/2016 03/17/2016  Glucose 70 - 140 mg/dl 238(H) 257(H)  BUN 7.0 - 26.0 mg/dL 7.3 8  Creatinine 0.6 - 1.1 mg/dL 0.7 0.50  Sodium 136 - 145 mEq/L 137 141  Potassium 3.5 - 5.1 mEq/L 3.4(L) 3.3(L)  Chloride 101 - 111 mmol/L - 101  CO2 22 - 29 mEq/L 24 -  Calcium 8.4 - 10.4 mg/dL 8.7 -  Total Protein 6.4 - 8.3 g/dL 7.1 -  Total Bilirubin 0.20 - 1.20 mg/dL 0.64 -  Alkaline Phos 40 - 150 U/L 102 -  AST 5 - 34 U/L 11 -  ALT 0 - 55 U/L 13 -   Magnesium 1.1.           RADIOGRAPHIC STUDIES: I have personally reviewed the radiological images as listed and agreed with the findings in the report. PET/CT IMPRESSION: 1. Hypermetabolic 1.7 cm peripheral right hilar nodule. Given the peripheral location of this nodule within the right hilum, this could potentially represent a centrally located primary bronchogenic malignancy. 2. Hypermetabolic bulky ipsilateral paratracheal, bilateral prevascular mediastinal and bilateral scalene nodal metastases. 3. Hypermetabolic right lower neck nodal  metastases involving right neck nodal levels III, IV and V. 4. No hypermetabolic metastatic disease in the abdomen, pelvis or skeleton. 5. Aortic atherosclerosis.   Electronically Signed   By: Ilona Sorrel M.D.   On: 01/12/2016 09:42    CTA chest 04/16/2016  IMPRESSION: 1. No evidence of a pulmonary embolism. 2. New, 16 mm, focal area of ground-glass opacity in the right upper lobe. This may reflect metastatic disease. It may be inflammatory/infectious in origin. 3. There is a less well-defined but otherwise similar appearing area of opacity in the superior segment right lower lobe, which has increased from the prior chest CT. This also may reflect metastatic disease or be infectious or inflammatory in etiology. 4. There has been interval improvement in the lung carcinoma. The bulky right paratracheal mass has decreased in size. The supraclavicular adenopathy has significantly decreased with no residual enlarged lymph nodes.   Electronically Signed   By: Lajean Manes M.D.   On: 04/16/2016 12:02  ASSESSMENT & PLAN:   51 year old African-American female with history of hypertension, diabetes, asthma, dysuria mass and morbid obesity with   #1 Lung Adenocarcinoma Rt sided atleast Stage IIIB with large right paratracheal mass with mediastinal adenopathy that appears to have grown significantly over the last 6-7 months and rt supraclavicular LN +.   Noted to have a small mass in the left adrenal on CT but PET/CT neg for metastatic disease. Patient has been a lifelong nonsmoker  MRI of the brain was negative for any metastatic disease.  High PDL1 expression (90%) on [facility] One Neg for EGFR, ALK, ROS-1 and BRAF mutations.  Patient has completed her planned definitive chemoradiation with carbo Taxol on 02/13/2016. No prohibitive toxicities other than some grade 1 skin desquamation and some grade 1-2 radiation esophagitis. She has subsequently received 2 out of 2  planned dose of carboplatin + Taxol.  #2 resolving pneumonia/radiation pneumonitis . #3 resolving fatigue from chemotherapy  #4 normocytic anemia related to chemotherapy  Plan -Patient's CT of the chest on 04/16/1998 shows appropriate response to treatment with no evidence of new progression. -Patient is completing her antibiotics for possible pneumonia. -labs stable. No prohibitive toxicity from chemotherapy at this time other than Grade 1 fatigue and grade 1 anorexia Both of which are improving. -She  still has a PICC line in place which needs to be removed and switched out for report of possible. We discussed the option for consolidative treatment with maintenance Durvalumab - information provided and after discussing the risk and benefits the patient is agreeable to proceed with this. -recommended maintenance of good po hydration and food intake  #2 Sinus tachycardia - on toprol XL , no PE on CTA. Maintain good hydration. Pain mx as needed.  #3 neoplasm related pain is currently controlled without significant medications. Clinically likely has sleep apnea . Would need to be careful with sedative medications . Plan -carafate and prn magic mouthwash for radiation esophagitis related symptoms. #3 hypertension   #4 diabetes #5 dyslipidemia #6 asthma Plan - insulin SS for uncontrolled hyperglycemia in the setting of steroid use with chemotherapy. -continue f/u with PCP   RTC with Dr Irene Limbo in 4 weeks with rpt labs Setup patient to start maintenance Durvalumab in 4weeks immediately after clinic visit Information given to the pharmacist to help build a treatment plan  . No orders of the defined types were placed in this encounter.   All of the patients questions were answered with apparent satisfaction. The patient knows to call the clinic with any problems, questions or concerns.  I spent 30 minutes counseling the patient face to face. The total time spent in the appointment was 40  minutes and more than 50% was on counseling and direct patient cares.    Sullivan Lone MD [place] AAHIVMS [facility] Hematology/Oncology Physician [facility]  (Office):       [phone] (Work cell):  [phone] (Fax):           [phone]

## 2016-05-05 ENCOUNTER — Ambulatory Visit (HOSPITAL_COMMUNITY): Payer: Medicaid Other

## 2016-05-07 ENCOUNTER — Telehealth: Payer: Self-pay | Admitting: Hematology

## 2016-05-07 NOTE — Telephone Encounter (Signed)
Left a message for the patient to call and confirm appointments on 2/19

## 2016-05-09 ENCOUNTER — Other Ambulatory Visit: Payer: Self-pay | Admitting: Hematology

## 2016-05-11 ENCOUNTER — Ambulatory Visit (HOSPITAL_BASED_OUTPATIENT_CLINIC_OR_DEPARTMENT_OTHER): Payer: Medicaid Other

## 2016-05-11 ENCOUNTER — Telehealth: Payer: Self-pay | Admitting: *Deleted

## 2016-05-11 ENCOUNTER — Other Ambulatory Visit: Payer: Self-pay | Admitting: Hematology

## 2016-05-11 DIAGNOSIS — C3491 Malignant neoplasm of unspecified part of right bronchus or lung: Secondary | ICD-10-CM

## 2016-05-11 DIAGNOSIS — Z452 Encounter for adjustment and management of vascular access device: Secondary | ICD-10-CM | POA: Diagnosis present

## 2016-05-11 DIAGNOSIS — C3492 Malignant neoplasm of unspecified part of left bronchus or lung: Secondary | ICD-10-CM

## 2016-05-11 DIAGNOSIS — C770 Secondary and unspecified malignant neoplasm of lymph nodes of head, face and neck: Secondary | ICD-10-CM

## 2016-05-11 DIAGNOSIS — Z95828 Presence of other vascular implants and grafts: Secondary | ICD-10-CM

## 2016-05-11 MED ORDER — SODIUM CHLORIDE 0.9 % IJ SOLN
10.0000 mL | INTRAMUSCULAR | Status: DC | PRN
  Administered 2016-05-11: 10 mL via INTRAVENOUS
  Filled 2016-05-11: qty 10

## 2016-05-11 MED ORDER — LIDOCAINE-PRILOCAINE 2.5-2.5 % EX CREA: TOPICAL_CREAM | CUTANEOUS | 3 refills | Status: DC

## 2016-05-11 MED ORDER — HEPARIN SOD (PORK) LOCK FLUSH 100 UNIT/ML IV SOLN
500.0000 [IU] | Freq: Once | INTRAVENOUS | Status: AC | PRN
  Administered 2016-05-11: 250 [IU] via INTRAVENOUS
  Filled 2016-05-11: qty 5

## 2016-05-11 NOTE — Telephone Encounter (Signed)
Per patient request I have scheduled flush appt for today.

## 2016-05-14 ENCOUNTER — Other Ambulatory Visit: Payer: Self-pay

## 2016-05-14 ENCOUNTER — Emergency Department (HOSPITAL_COMMUNITY): Payer: Medicaid Other

## 2016-05-14 ENCOUNTER — Emergency Department (HOSPITAL_COMMUNITY)
Admission: EM | Admit: 2016-05-14 | Discharge: 2016-05-14 | Disposition: A | Discharge status: Home / Self Care | Payer: Medicaid Other | Attending: Emergency Medicine | Admitting: Emergency Medicine

## 2016-05-14 ENCOUNTER — Encounter (HOSPITAL_COMMUNITY): Payer: Self-pay | Admitting: Rehabilitation

## 2016-05-14 DIAGNOSIS — R0602 Shortness of breath: Secondary | ICD-10-CM | POA: Diagnosis present

## 2016-05-14 DIAGNOSIS — R000 Tachycardia, unspecified: Secondary | ICD-10-CM | POA: Diagnosis not present

## 2016-05-14 DIAGNOSIS — C3490 Malignant neoplasm of unspecified part of unspecified bronchus or lung: Secondary | ICD-10-CM

## 2016-05-14 DIAGNOSIS — J45909 Unspecified asthma, uncomplicated: Secondary | ICD-10-CM | POA: Diagnosis not present

## 2016-05-14 DIAGNOSIS — Z7982 Long term (current) use of aspirin: Secondary | ICD-10-CM | POA: Diagnosis not present

## 2016-05-14 DIAGNOSIS — C3491 Malignant neoplasm of unspecified part of right bronchus or lung: Secondary | ICD-10-CM | POA: Diagnosis not present

## 2016-05-14 DIAGNOSIS — Z79899 Other long term (current) drug therapy: Secondary | ICD-10-CM | POA: Diagnosis not present

## 2016-05-14 DIAGNOSIS — Z794 Long term (current) use of insulin: Secondary | ICD-10-CM | POA: Insufficient documentation

## 2016-05-14 DIAGNOSIS — Z85118 Personal history of other malignant neoplasm of bronchus and lung: Secondary | ICD-10-CM | POA: Diagnosis not present

## 2016-05-14 DIAGNOSIS — E1140 Type 2 diabetes mellitus with diabetic neuropathy, unspecified: Secondary | ICD-10-CM | POA: Diagnosis not present

## 2016-05-14 DIAGNOSIS — J189 Pneumonia, unspecified organism: Secondary | ICD-10-CM | POA: Insufficient documentation

## 2016-05-14 DIAGNOSIS — E1165 Type 2 diabetes mellitus with hyperglycemia: Secondary | ICD-10-CM

## 2016-05-14 LAB — I-STAT TROPONIN, ED: Troponin i, poc: 0.01 ng/mL (ref 0.00–0.08)

## 2016-05-14 LAB — BRAIN NATRIURETIC PEPTIDE: B NATRIURETIC PEPTIDE 5: 25.8 pg/mL (ref 0.0–100.0)

## 2016-05-14 LAB — CBC
HEMATOCRIT: 29.5 % — AB (ref 36.0–46.0)
Hemoglobin: 9.8 g/dL — ABNORMAL LOW (ref 12.0–15.0)
MCH: 28.7 pg (ref 26.0–34.0)
MCHC: 33.2 g/dL (ref 30.0–36.0)
MCV: 86.3 fL (ref 78.0–100.0)
PLATELETS: 377 10*3/uL (ref 150–400)
RBC: 3.42 MIL/uL — ABNORMAL LOW (ref 3.87–5.11)
RDW: 13.2 % (ref 11.5–15.5)
WBC: 7.5 10*3/uL (ref 4.0–10.5)

## 2016-05-14 LAB — BASIC METABOLIC PANEL
Anion gap: 11 (ref 5–15)
BUN: 7 mg/dL (ref 6–20)
CHLORIDE: 103 mmol/L (ref 101–111)
CO2: 26 mmol/L (ref 22–32)
CREATININE: 0.64 mg/dL (ref 0.44–1.00)
Calcium: 8.9 mg/dL (ref 8.9–10.3)
GFR calc Af Amer: >60 mL/min (ref >60)
GFR calc non Af Amer: >60 mL/min (ref >60)
GLUCOSE: 259 mg/dL — AB (ref 65–99)
POTASSIUM: 3.5 mmol/L (ref 3.5–5.1)
Sodium: 140 mmol/L (ref 135–145)

## 2016-05-14 LAB — I-STAT CG4 LACTIC ACID, ED: Lactic Acid, Venous: 0.73 mmol/L (ref 0.5–1.9)

## 2016-05-14 MED ORDER — AZITHROMYCIN 250 MG PO TABS: 250.0000 mg | ORAL_TABLET | Freq: Every day | ORAL | 0 refills | Status: DC

## 2016-05-14 MED ORDER — HEPARIN SOD (PORK) LOCK FLUSH 100 UNIT/ML IV SOLN
500.0000 [IU] | Freq: Once | INTRAVENOUS | Status: AC
  Administered 2016-05-14: 250 [IU]
  Filled 2016-05-14: qty 5

## 2016-05-14 MED ORDER — SODIUM CHLORIDE 0.9 % IV BOLUS (SEPSIS)
1000.0000 mL | Freq: Once | INTRAVENOUS | Status: AC
  Administered 2016-05-14: 1000 mL via INTRAVENOUS

## 2016-05-14 MED ORDER — DEXTROSE 5 % IV SOLN
500.0000 mg | Freq: Once | INTRAVENOUS | Status: AC
  Administered 2016-05-14: 500 mg via INTRAVENOUS
  Filled 2016-05-14: qty 500

## 2016-05-14 MED ORDER — HYDROMORPHONE HCL 1 MG/ML IJ SOLN
1.0000 mg | Freq: Once | INTRAMUSCULAR | Status: AC
  Administered 2016-05-14: 1 mg via INTRAVENOUS
  Filled 2016-05-14: qty 1

## 2016-05-14 MED ORDER — SODIUM CHLORIDE 0.9 % IJ SOLN
INTRAMUSCULAR | Status: AC
  Filled 2016-05-14: qty 50

## 2016-05-14 MED ORDER — ONDANSETRON HCL 4 MG/2ML IJ SOLN
4.0000 mg | Freq: Once | INTRAMUSCULAR | Status: AC
  Administered 2016-05-14: 4 mg via INTRAVENOUS
  Filled 2016-05-14: qty 2

## 2016-05-14 MED ORDER — IOPAMIDOL (ISOVUE-370) INJECTION 76%
100.0000 mL | Freq: Once | INTRAVENOUS | Status: AC | PRN
  Administered 2016-05-14: 70 mL via INTRAVENOUS

## 2016-05-14 MED ORDER — DEXTROSE 5 % IV SOLN
1.0000 g | Freq: Once | INTRAVENOUS | Status: AC
  Administered 2016-05-14: 1 g via INTRAVENOUS
  Filled 2016-05-14: qty 10

## 2016-05-14 MED ORDER — HYDROMORPHONE HCL 1 MG/ML IJ SOLN
0.5000 mg | Freq: Once | INTRAMUSCULAR | Status: AC
  Administered 2016-05-14: 0.5 mg via INTRAVENOUS
  Filled 2016-05-14: qty 1

## 2016-05-14 MED ORDER — PREDNISONE 20 MG PO TABS: ORAL_TABLET | ORAL | 0 refills | Status: DC

## 2016-05-14 MED ORDER — IOPAMIDOL (ISOVUE-370) INJECTION 76%
INTRAVENOUS | Status: AC
  Filled 2016-05-14: qty 100

## 2016-05-14 NOTE — Discharge Instructions (Signed)
Please follow with your primary care doctor in the next 2 days for a check-up. They must obtain records for further management.  ° °Do not hesitate to return to the Emergency Department for any new, worsening or concerning symptoms.  ° °

## 2016-05-14 NOTE — ED Triage Notes (Signed)
Per EMS, pt with hx of lung cancer complains of dyspnea for 1 week, intermittent chest wall tightness. Pt was hyperventilating upon EMS arrival, pt was able to slow breathing with EMS. Pt's sats on RA were 97% upon arrival. Pt last received chemotherapy and radiation in December.   BP 169/123 HR 140, pt has hx of tachycardia CBG 280  Pt has PICC line in right arm.

## 2016-05-14 NOTE — Consult Note (Signed)
History and Physical   JAYCE BOYKO IOX:[id number] DOB: [birth year] DOA: 05/14/2016  Referring MD/NP/PA: Monico Blitz, PA, EDP PCP: Philis Fendt, MD Outpatient Specialists: Dr. Irene Limbo, Oncology  Patient coming from: Home  Chief Complaint: Shortness of breath  HPI: Marilyn [name] is a 51 y.o. female with a history of stage 3 lung CA s/p chemo and XRT in 2017, IDDM, and sinus tachycardia who presented to the ED with shortness of breath.  She reports feeling mild dyspnea 2 afternoons ago which developed into worsening constant shortness of breath worse with exertion associated with cramping chest pain that originates on right anterior chest radiating to the left not changed with breathing, position, or exertion. She endorses a nonproductive cough that has continued but predates these symptoms. Her appetite has been poor but she's continued to drink fluids without nausea or emesis. She had some retching in the ED for the first time since illness onset but has no nausea while taking clear liquids now.   She was diagnosed with NSCLC, followed by Dr. Irene Limbo and had chemo in Nov 2017 and radiation that completed in Dec 2017. She's not been on any further therapy, though immunomodulator therapy is planned.   Review of Systems: No fevers, chills, Denies fever, chills, headache, sore throat, congestion, rhinorrhea, ear pain, palpitations, abdominal pain, nausea, vomiting, changes in bowel habits, blood in stool, change in bladder habits, myalgias, arthralgias, and rash. All other systems reviewed and are negative.   Past Medical History:  Diagnosis Date  . Asthma   . Diabetes mellitus   . Hypercholesteremia   . Hypertension   . Lung cancer ([place])   . Neuropathy ([place])   . Obese    Past Surgical History:  Procedure Laterality Date  . CESAREAN SECTION    . IR GENERIC HISTORICAL  01/08/2016   IR US GUIDE VASC ACCESS RIGHT 01/08/2016 [name], DO [facility]  . IR GENERIC HISTORICAL  01/08/2016     IR FLUORO GUIDE CV LINE RIGHT 01/08/2016 [name], DO [facility]  . TUBAL LIGATION    . US guided core needle biopsy     of right lower neck/supraclavicular lymph nodes   - Never smoker, doesn't drink alcohol or use illicit drugs. Has strong religious faith and lives with mother and son who will be home all weekend.  No Known Allergies   Family History  Problem Relation Age of Onset  . Cancer Neg Hx   . Rheumatologic disease Neg Hx    - Family history otherwise reviewed and not pertinent.  Prior to Admission medications   Medication Sig Start Date End Date Taking? Authorizing Provider  aspirin EC 81 MG tablet Take 1 tablet (81 mg total) by mouth daily. 01/16/16  Yes [name], MD  chlorhexidine (PERIDEX) 0.12 % solution Use as directed 15 mLs in the mouth or throat 2 (two) times daily. Patient taking differently: Use as directed 15 mLs in the mouth or throat 2 (two) times daily as needed (mouth pain).  01/05/16  Yes [name], MD  HYDROcodone-acetaminophen (HYCET) 7.5-325 mg/15 ml solution Take 15 mLs by mouth 4 (four) times daily as needed for moderate pain. 02/06/16 02/05/17 Yes Gautam Juleen China, MD  insulin aspart (NOVOLOG) 100 UNIT/ML FlexPen As per sliding scale instructions Patient taking differently: Inject 0-20 Units into the skin 3 (three) times daily as needed for high blood sugar. As per sliding scale instructions 01/16/16  Yes Gautam Juleen China, MD  Insulin Degludec (TRESIBA  FLEXTOUCH) 200 UNIT/ML SOPN Inject 100 Units into the skin daily before breakfast.    Yes Historical Provider, MD  loratadine (CLARITIN) 10 MG tablet Take 10 mg by mouth daily.   Yes Historical Provider, MD  magic mouthwash w/lidocaine SOLN Take 5 mLs by mouth 4 (four) times daily as needed for mouth pain. 01/23/16  Yes [name], MD  metoprolol succinate (TOPROL-XL) 25 MG 24 hr tablet Take 1 tablet (25 mg total) by mouth daily. 03/12/16  Yes Susanne Borders, NP   nystatin (MYCOSTATIN) 100000 UNIT/ML suspension Take 5 mLs (500,000 Units total) by mouth 4 (four) times daily. Patient taking differently: Take 5 mLs by mouth 4 (four) times daily as needed.  03/18/16  Yes Gautam Juleen China, MD  promethazine (PHENERGAN) 25 MG tablet Take 1 tablet (25 mg total) by mouth every 6 (six) hours as needed for nausea or vomiting. 03/17/16  Yes Elmyra Ricks Pisciotta, PA-C  sucralfate (CARAFATE) 1 g tablet Take 1 tablet (1 g total) by mouth 4 (four) times daily -  with meals and at bedtime. 5 min before meals for radiation induced esophagitis Patient taking differently: Take 1 g by mouth 4 (four) times daily as needed (stomach coating). 5 min before meals for radiation induced esophagitis 01/22/16  Yes Tyler Pita, MD  Wound Dressings (SONAFINE) Apply 1 application topically 2 (two) times daily.   Yes Historical Provider, MD  fluconazole (DIFLUCAN) 100 MG tablet Take 1 tablet (100 mg total) by mouth daily. Patient not taking: Reported on 05/14/2016 04/07/16   Susanne Borders, NP  Fluocinonide 0.1 % CREA Apply 1 application topically daily.  01/06/16   Historical Provider, MD  levofloxacin (LEVAQUIN) 500 MG tablet Take 1 tablet (500 mg total) by mouth daily. Patient not taking: Reported on 05/14/2016 04/16/16   Forde Dandy, MD  lidocaine-prilocaine (EMLA) cream Apply to affected area once Patient not taking: Reported on 05/14/2016 05/11/16   [name], MD    Physical Exam: Vitals:   05/14/16 1230 05/14/16 1300 05/14/16 1530 05/14/16 1724  BP: 142/81 139/84 134/84 134/84  Pulse: (!) 143 (!) 138 (!) 130 (!) 128  Resp: '20 18 25 19  '$ Temp:    98.3 F (36.8 C)  TempSrc:    Oral  SpO2: 99% 96% 98% 94%  Weight:      Height:       Constitutional: 51 y.o. female in no distress, calm demeanor Eyes: Lids and conjunctivae normal, PERRL ENMT: Mucous membranes are moist. Posterior pharynx clear of any exudate or lesions. Poor dentition.  Neck: normal, supple, no masses, no  thyromegaly Respiratory: Non-labored breathing, normal rate when resting, without accessory muscle use. Clear breath sounds to auscultation bilaterally Cardiovascular: Tachycardic rate and regular rhythm, no murmurs, rubs, or gallops. No carotid bruits. No JVD. No LE edema. 2+ pedal pulses. Abdomen: Normoactive bowel sounds. Soft, obese. No tenderness, non-distended, and no masses palpated. No hepatosplenomegaly. GU: No indwelling catheter Musculoskeletal: No clubbing / cyanosis. No joint deformity upper and lower extremities. Good ROM, no contractures. Normal muscle tone.  Skin: Warm, dry. No rashes, wounds, no ulcers. Neurologic: CN II-XII grossly intact. Speech normal. No focal deficits in motor strength or sensation in all extremities.  Psychiatric: Alert and oriented x3. Normal judgment and insight. Mood euthymic with congruent affect.   Labs on Admission: I have personally reviewed following labs and imaging studies  CBC:  Recent Labs Lab 05/14/16 1156  WBC 7.5  HGB 9.8*  HCT 29.5*  MCV  86.3  PLT 124   Basic Metabolic Panel:  Recent Labs Lab 05/14/16 1156  NA 140  K 3.5  CL 103  CO2 26  GLUCOSE 259*  BUN 7  CREATININE 0.64  CALCIUM 8.9   GFR: Estimated Creatinine Clearance: 114.7 mL/min (by C-G formula based on SCr of 0.64 mg/dL).  Radiological Exams on Admission: Dg Chest 2 View  Result Date: 05/14/2016 CLINICAL DATA:  Chest tightness, history lung cancer, shortness of breath EXAM: CHEST  2 VIEW COMPARISON:  04/16/2016 FINDINGS: There is a right-sided PICC line with the tip projecting over the SVC. There is patchy right upper lobe airspace disease most concerning for pneumonia. The lungs are otherwise clear. There is no pleural effusion or pneumothorax. The heart and mediastinal contours are unremarkable. The osseous structures are unremarkable. IMPRESSION: Right upper lobe pneumonia. Followup PA and lateral chest X-ray is recommended in 3-4 weeks following trial of  antibiotic therapy to ensure resolution and exclude underlying malignancy. Electronically Signed   By: Kathreen Devoid   On: 05/14/2016 12:21   Ct Angio Chest Pe W And/or Wo Contrast  Result Date: 05/14/2016 CLINICAL DATA:  Lung cancer. Dyspnea for 1 week. Chest wall tightness. Cough. Shortness of breath. Chest pain. EXAM: CT ANGIOGRAPHY CHEST WITH CONTRAST TECHNIQUE: Multidetector CT imaging of the chest was performed using the standard protocol during bolus administration of intravenous contrast. Multiplanar CT image reconstructions and MIPs were obtained to evaluate the vascular anatomy. CONTRAST:  70 cc Isovue 370 COMPARISON:  Multiple exams, including 04/16/2016 FINDINGS: Cardiovascular: No filling defect is identified in the pulmonary arterial tree to suggest pulmonary embolus. No acute aortic findings. Mediastinum/Nodes: Right paratracheal mass measures 3.6 cm transverse, previously 3.7 cm, on image 37/4. This abuts the carina and right mainstem bronchus, as before. Lungs/Pleura: As shown on recent chest radiography but new from the prior chest CT, there is increase in airspace opacity in the right upper lobe and right lower lobe, especially in dependent portions, with air bronchograms. Smaller amount of similar airspace opacity medially in the left upper lobe on image 22/7. Small amount of involvement posteriorly in the right middle lobe on image 55/9. Upper Abdomen: Unremarkable Musculoskeletal: Thoracic spondylosis. Review of the MIP images confirms the above findings. IMPRESSION: 1. No filling defect is identified in the pulmonary arterial tree to suggest pulmonary embolus. 2. Primarily dependent and sharply defined hazy airspace opacities in the right upper lobe and right lower lobe, with some minimal involvement of the left upper lobe and posterior portion of the right middle lobe. At the margins these mostly conform to secondary pulmonary lobules. Appearance suspicious for multilobar pneumonia,  aspiration pneumonia might have a similar appearance. Endobronchial spread of tumor might conceivably have a similar appearance; the right paratracheal mass is not appreciably changed in size or morphology and there is no obvious tumor extension into the adjacent trachea or right mainstem bronchus, and no airway occlusion is observed. Electronically Signed   By: Van Clines M.D.   On: 05/14/2016 14:47   EKG: Independently reviewed. Sinus tachycardia with T wave flattening in lateral precordial leads unchanged from prior study last month. No ST deviations.   Assessment/Plan Community-acquired pneumonia: Mild without hypoxemia and clear lung sounds. Afebrile, tachycardia present but at baseline worsened by not taking beta blocker today. She is not immunosuppressed. WBC wnl, lactate wnl, qSOFA score zero. Troponin negative, ECG unchanged/nonischemic. CTA negative for PE, showing infiltrate. Discussed with Dr. Marin Olp who suspects an element of radiation pneumonitis which makes sense  in the absence of SIRS. Because she is able to tolerate per oral intake including antibiotics and maintain hydration, I believe risks of hospitalization surpass potential benefits in this patient with home support and reliable follow up on Monday. - Received ceftriaxone and azithromycin, recommend oral monotherapy as an outpatient. She has tolerated levaquin in the past.  - Follow blood cultures, no sputum for evaluation.  - For possible pneumonitis, agree with steroids per oncology (limited dose/duration so as to not exacerbate diabetes) - Patient was given strict and clear return precautions including onset of fever, worsening of chest pain or dyspnea, leg swelling, or orthopnea.   Stage 3 NSCLC: Followed by Dr. Irene Limbo. Seen by Dr. Marin Olp in the ED, and I discussed the case with him. Not on current therapy.  - Follow up in cancer center Monday  Sinus tachycardia: Unchanged from baseline now that she was given her home  beta blocker. No evidence of cardiomyopathy or ischemia at this time. Last echo in Sept 2017 with normal EF.  - She appears euvolemic, so I do not believe IV fluids are necessary.   Vance Gather, MD [facility] Pager [phone]

## 2016-05-14 NOTE — ED Notes (Signed)
Patient transported to X-ray 

## 2016-05-14 NOTE — ED Provider Notes (Signed)
[facility] DEPT Provider Note   CSN: [id number] Arrival date & time: 05/14/16  1126     History   Chief Complaint Chief Complaint  Patient presents with  . Shortness of Breath    HPI   Blood pressure 163/97, pulse (!) 144, temperature 98.1 F (36.7 C), temperature source Oral, resp. rate 18, height '5\' 9"'$  (1.753 m), weight 112 kg, SpO2 98 %.  Marilyn [name] is a 51 y.o. female with past medical history significant for diabetes, asthma, hypertension, high cholesterol and blood active lung cancer (last chemotherapy in December 2017) chronic tachycardia (states she normally runs around 130, she takes Toprol-XL which she didn't have this morning because she did not eat) sent here from primary care for evaluation of chest pain and shortness of breath worsening over the course the last 24 hours. She states she feels a pressure-like chest pain radiating from the right side to the left side with dyspnea on exertion and shortness of breath. She denies increasing peripheral edema, orthopnea, PND, prior cardiac issues. She doesn't have a history of DVT/PE. She denies fevers, chills, calf pain, leg swelling, hemoptysis. She does endorse a dry cough with no upper respiratory symptoms. She does note and aching in the bilateral lower legs without swelling.  Past Medical History:  Diagnosis Date  . Asthma   . Diabetes mellitus   . Hypercholesteremia   . Hypertension   . Lung cancer ([place])   . Neuropathy ([place])   . Obese     Patient Active Problem List   Diagnosis Date Noted  . Thrush of mouth and esophagus ([place]) 04/07/2016  . Constipation 04/07/2016  . Palliative care by specialist   . Port catheter in place 01/19/2016  . Dehydration 01/14/2016  . Tachycardia 01/14/2016  . Adenocarcinoma of left lung, stage 3 ([place]) 01/01/2016  . Non-small cell cancer of right lung ([place]) 12/31/2015  . Mediastinal mass   . Chest wall pain, chronic 12/26/2015  . Headache 12/26/2015  . Diabetes mellitus  ([place]) 12/26/2015    Past Surgical History:  Procedure Laterality Date  . CESAREAN SECTION    . IR GENERIC HISTORICAL  01/08/2016   IR US GUIDE VASC ACCESS RIGHT 01/08/2016 [name], DO [facility]  . IR GENERIC HISTORICAL  01/08/2016   IR FLUORO GUIDE CV LINE RIGHT 01/08/2016 [name], DO [facility]  . TUBAL LIGATION    . US guided core needle biopsy     of right lower neck/supraclavicular lymph nodes    OB History    No data available       Home Medications    Prior to Admission medications   Medication Sig Start Date End Date Taking? Authorizing Provider  aspirin EC 81 MG tablet Take 1 tablet (81 mg total) by mouth daily. 01/16/16  Yes [name], MD  chlorhexidine (PERIDEX) 0.12 % solution Use as directed 15 mLs in the mouth or throat 2 (two) times daily. Patient taking differently: Use as directed 15 mLs in the mouth or throat 2 (two) times daily as needed (mouth pain).  01/05/16  Yes [name], MD  HYDROcodone-acetaminophen (HYCET) 7.5-325 mg/15 ml solution Take 15 mLs by mouth 4 (four) times daily as needed for moderate pain. 02/06/16 02/05/17 Yes Gautam Juleen China, MD  insulin aspart (NOVOLOG) 100 UNIT/ML FlexPen As per sliding scale instructions Patient taking differently: Inject 0-20 Units into the skin 3 (three) times daily as needed for high blood sugar. As per sliding scale instructions 01/16/16  Yes [name], MD  Insulin Degludec (TRESIBA FLEXTOUCH) 200 UNIT/ML SOPN Inject 100 Units into the skin daily before breakfast.    Yes Historical Provider, MD  loratadine (CLARITIN) 10 MG tablet Take 10 mg by mouth daily.   Yes Historical Provider, MD  magic mouthwash w/lidocaine SOLN Take 5 mLs by mouth 4 (four) times daily as needed for mouth pain. 01/23/16  Yes [name], MD  metoprolol succinate (TOPROL-XL) 25 MG 24 hr tablet Take 1 tablet (25 mg total) by mouth daily. 03/12/16  Yes Susanne Borders, NP  nystatin (MYCOSTATIN)  100000 UNIT/ML suspension Take 5 mLs (500,000 Units total) by mouth 4 (four) times daily. Patient taking differently: Take 5 mLs by mouth 4 (four) times daily as needed.  03/18/16  Yes Gautam Juleen China, MD  promethazine (PHENERGAN) 25 MG tablet Take 1 tablet (25 mg total) by mouth every 6 (six) hours as needed for nausea or vomiting. 03/17/16  Yes Elmyra Ricks Yobany Vroom, PA-C  sucralfate (CARAFATE) 1 g tablet Take 1 tablet (1 g total) by mouth 4 (four) times daily -  with meals and at bedtime. 5 min before meals for radiation induced esophagitis Patient taking differently: Take 1 g by mouth 4 (four) times daily as needed (stomach coating). 5 min before meals for radiation induced esophagitis 01/22/16  Yes Tyler Pita, MD  Wound Dressings (SONAFINE) Apply 1 application topically 2 (two) times daily.   Yes Historical Provider, MD  azithromycin (ZITHROMAX Z-PAK) 250 MG tablet Take 1 tablet (250 mg total) by mouth daily. '500mg'$  PO day 1, then '250mg'$  PO days 205 05/14/16   Kaylan Yates, PA-C  fluconazole (DIFLUCAN) 100 MG tablet Take 1 tablet (100 mg total) by mouth daily. Patient not taking: Reported on 05/14/2016 04/07/16   Susanne Borders, NP  Fluocinonide 0.1 % CREA Apply 1 application topically daily.  01/06/16   Historical Provider, MD  lidocaine-prilocaine (EMLA) cream Apply to affected area once Patient not taking: Reported on 05/14/2016 05/11/16   [name], MD  predniSONE (DELTASONE) 20 MG tablet 3 tabs po day one, then 2 tabs daily x 4 days 05/14/16   Monico Blitz, PA-C    Family History Family History  Problem Relation Age of Onset  . Cancer Neg Hx   . Rheumatologic disease Neg Hx     Social History Social History  Substance Use Topics  . Smoking status: Never Smoker  . Smokeless tobacco: Never Used  . Alcohol use No     Allergies   Patient has no known allergies.   Review of Systems Review of Systems  10 systems reviewed and found to be negative, except as noted in  the HPI.   Physical Exam Updated Vital Signs BP 143/81   Pulse (!) 129   Temp 98.3 F (36.8 C) (Oral)   Resp 21   Ht '5\' 9"'$  (1.753 m)   Wt 112 kg   SpO2 98%   BMI 36.48 kg/m   Physical Exam  Constitutional: She is oriented to person, place, and time. She appears well-developed and well-nourished. No distress.  Obese  HENT:  Head: Normocephalic and atraumatic.  Mouth/Throat: Oropharynx is clear and moist.  Eyes: Conjunctivae and EOM are normal. Pupils are equal, round, and reactive to light.  Neck: Normal range of motion. No JVD present. No tracheal deviation present.  Cardiovascular: Regular rhythm and intact distal pulses.   Tachycardic but regular in the 130s and 40s  Pulmonary/Chest: Breath sounds normal. No stridor. No  respiratory distress. She has no wheezes. She has no rales. She exhibits no tenderness.  She is quite winded, speaking in short sentences   Slightly decreased air movement, no wheezing, no rhonchi  Abdominal: Soft. She exhibits no distension and no mass. There is no tenderness. There is no rebound and no guarding.  Musculoskeletal: Normal range of motion. She exhibits no edema or tenderness.  No calf asymmetry, superficial collaterals, palpable cords, edema, Homans sign negative bilaterally.    Neurological: She is alert and oriented to person, place, and time.  Skin: Skin is warm. She is not diaphoretic.  Psychiatric: She has a normal mood and affect.  Nursing note and vitals reviewed.    ED Treatments / Results  Labs (all labs ordered are listed, but only abnormal results are displayed) Labs Reviewed  BASIC METABOLIC PANEL - Abnormal; Notable for the following:       Result Value   Glucose, Bld 259 (*)    All other components within normal limits  CBC - Abnormal; Notable for the following:    RBC 3.42 (*)    Hemoglobin 9.8 (*)    HCT 29.5 (*)    All other components within normal limits  CULTURE, BLOOD (ROUTINE X 2)  CULTURE, BLOOD (ROUTINE X  2)  BRAIN NATRIURETIC PEPTIDE  URINALYSIS, ROUTINE W REFLEX MICROSCOPIC  I-STAT TROPOININ, ED  I-STAT CG4 LACTIC ACID, ED    EKG  EKG Interpretation None       Radiology Dg Chest 2 View  Result Date: 05/14/2016 CLINICAL DATA:  Chest tightness, history lung cancer, shortness of breath EXAM: CHEST  2 VIEW COMPARISON:  04/16/2016 FINDINGS: There is a right-sided PICC line with the tip projecting over the SVC. There is patchy right upper lobe airspace disease most concerning for pneumonia. The lungs are otherwise clear. There is no pleural effusion or pneumothorax. The heart and mediastinal contours are unremarkable. The osseous structures are unremarkable. IMPRESSION: Right upper lobe pneumonia. Followup PA and lateral chest X-ray is recommended in 3-4 weeks following trial of antibiotic therapy to ensure resolution and exclude underlying malignancy. Electronically Signed   By: Kathreen Devoid   On: 05/14/2016 12:21   Ct Angio Chest Pe W And/or Wo Contrast  Result Date: 05/14/2016 CLINICAL DATA:  Lung cancer. Dyspnea for 1 week. Chest wall tightness. Cough. Shortness of breath. Chest pain. EXAM: CT ANGIOGRAPHY CHEST WITH CONTRAST TECHNIQUE: Multidetector CT imaging of the chest was performed using the standard protocol during bolus administration of intravenous contrast. Multiplanar CT image reconstructions and MIPs were obtained to evaluate the vascular anatomy. CONTRAST:  70 cc Isovue 370 COMPARISON:  Multiple exams, including 04/16/2016 FINDINGS: Cardiovascular: No filling defect is identified in the pulmonary arterial tree to suggest pulmonary embolus. No acute aortic findings. Mediastinum/Nodes: Right paratracheal mass measures 3.6 cm transverse, previously 3.7 cm, on image 37/4. This abuts the carina and right mainstem bronchus, as before. Lungs/Pleura: As shown on recent chest radiography but new from the prior chest CT, there is increase in airspace opacity in the right upper lobe and right  lower lobe, especially in dependent portions, with air bronchograms. Smaller amount of similar airspace opacity medially in the left upper lobe on image 22/7. Small amount of involvement posteriorly in the right middle lobe on image 55/9. Upper Abdomen: Unremarkable Musculoskeletal: Thoracic spondylosis. Review of the MIP images confirms the above findings. IMPRESSION: 1. No filling defect is identified in the pulmonary arterial tree to suggest pulmonary embolus. 2. Primarily dependent and sharply  defined hazy airspace opacities in the right upper lobe and right lower lobe, with some minimal involvement of the left upper lobe and posterior portion of the right middle lobe. At the margins these mostly conform to secondary pulmonary lobules. Appearance suspicious for multilobar pneumonia, aspiration pneumonia might have a similar appearance. Endobronchial spread of tumor might conceivably have a similar appearance; the right paratracheal mass is not appreciably changed in size or morphology and there is no obvious tumor extension into the adjacent trachea or right mainstem bronchus, and no airway occlusion is observed. Electronically Signed   By: Van Clines M.D.   On: 05/14/2016 14:47    Procedures Procedures (including critical care time)  Medications Ordered in ED Medications  sodium chloride 0.9 % injection (not administered)  iopamidol (ISOVUE-370) 76 % injection (not administered)  HYDROmorphone (DILAUDID) injection 0.5 mg (0.5 mg Intravenous Given 05/14/16 1238)  ondansetron (ZOFRAN) injection 4 mg (4 mg Intravenous Given 05/14/16 1238)  cefTRIAXone (ROCEPHIN) 1 g in dextrose 5 % 50 mL IVPB (0 g Intravenous Stopped 05/14/16 1539)  azithromycin (ZITHROMAX) 500 mg in dextrose 5 % 250 mL IVPB (0 mg Intravenous Stopped 05/14/16 1619)  iopamidol (ISOVUE-370) 76 % injection 100 mL (70 mLs Intravenous Contrast Given 05/14/16 1408)  sodium chloride 0.9 % bolus 1,000 mL (0 mLs Intravenous Stopped 05/14/16  1620)  HYDROmorphone (DILAUDID) injection 1 mg (1 mg Intravenous Given 05/14/16 1511)  ondansetron (ZOFRAN) injection 4 mg (4 mg Intravenous Given 05/14/16 1632)     Initial Impression / Assessment and Plan / ED Course  I have reviewed the triage vital signs and the nursing notes.  Pertinent labs & imaging results that were available during my care of the patient were reviewed by me and considered in my medical decision making (see chart for details).     Vitals:   05/14/16 1700 05/14/16 1724 05/14/16 1730 05/14/16 1800  BP: 128/82 134/84 139/84 143/81  Pulse: (!) 131 (!) 128 (!) 130 (!) 129  Resp: '16 19 18 21  '$ Temp:  98.3 F (36.8 C)    TempSrc:  Oral    SpO2: 97% 94% 93% 98%  Weight:      Height:        Medications  sodium chloride 0.9 % injection (not administered)  iopamidol (ISOVUE-370) 76 % injection (not administered)  HYDROmorphone (DILAUDID) injection 0.5 mg (0.5 mg Intravenous Given 05/14/16 1238)  ondansetron (ZOFRAN) injection 4 mg (4 mg Intravenous Given 05/14/16 1238)  cefTRIAXone (ROCEPHIN) 1 g in dextrose 5 % 50 mL IVPB (0 g Intravenous Stopped 05/14/16 1539)  azithromycin (ZITHROMAX) 500 mg in dextrose 5 % 250 mL IVPB (0 mg Intravenous Stopped 05/14/16 1619)  iopamidol (ISOVUE-370) 76 % injection 100 mL (70 mLs Intravenous Contrast Given 05/14/16 1408)  sodium chloride 0.9 % bolus 1,000 mL (0 mLs Intravenous Stopped 05/14/16 1620)  HYDROmorphone (DILAUDID) injection 1 mg (1 mg Intravenous Given 05/14/16 1511)  ondansetron (ZOFRAN) injection 4 mg (4 mg Intravenous Given 05/14/16 1632)    Marilyn [name] is 51 y.o. female presenting with Right-sided chest pain radiating to the left with associated shortness of breath onset yesterday. Patient is significantly tachycardic in the 140s however, she does have chronic tachycardia and she didn't take her Toprol XL this morning. EKG with sinus tach. Troponin negative. Lung sounds with good air movement, oxygen saturation is excellent on room  air.  Chest x-ray with pneumonia however clinically this doesn't seem likely she is afebrile, no white count she's not reporting  productive cough. Will obtain CTA and patient will be started on a community-acquired regimen.  Patient remains very short of breath and in significant pain, I hesitate to give her albuterol because of her severe tachycardia. She has received fluid boluses and her beta blocker but she still remains significantly tachycardic in the 140s.  CTA without pulmonary embolism, given her shortness of breath severe pain she will need admission  This is a shared visit with the attending physician who personally evaluated the patient and agrees with the care plan.   In attempting to ambulate this patient to check her oxygen saturation she was not able to walk she felt extremely lightheaded, she vomited after the ambulation attempts. Pt given zofran.   Oncology consult from Dr. Marin Olp appreciated: states that with a history of radiation there may be an element of pneumonitis.   Discussed with Dr. Bonner Puna who will see the patient.  Hospitalist Dr. Hermina Barters and oncologist Dr. Marin Olp have both evaluated the patient and agrees that she is stable for discharge to home. Given the possibility of a radiation pneumonitis they recommend starting her on steroids, she is a type I diabetic and checks her blood sugars frequently. She will have a follow-up with oncology on Monday. Extensive discussion of return precautions and patient verbalizes her understanding and teach back technique.  Final Clinical Impressions(s) / ED Diagnoses   Final diagnoses:  Community acquired pneumonia, unspecified laterality    New Prescriptions New Prescriptions   AZITHROMYCIN (ZITHROMAX Z-PAK) 250 MG TABLET    Take 1 tablet (250 mg total) by mouth daily. '500mg'$  PO day 1, then '250mg'$  PO days 205   PREDNISONE (DELTASONE) 20 MG TABLET    3 tabs po day one, then 2 tabs daily x 4 days     Monico Blitz,  PA-C 05/14/16 1819    Julianne Rice, MD 05/19/16 1524

## 2016-05-14 NOTE — ED Notes (Signed)
Attempted to ambulate patient. Patient reports dizziness upon sitting and doesn't feel comfortable standing and trying to walk at this time.

## 2016-05-14 NOTE — Consult Note (Signed)
Referral MD  Reason for Referral: Shortness of breath. 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 Shortness of Breath  : I feel short of breath.  HPI: [name] is a very nice 51 year old African-American female. She has history of diabetes. 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 She has adenocarcinoma. She has been treated with radiation and chemotherapy. She completed radiation and chemotherapy. She received carboplatinum/Taxol with radiation. She completed everything back in November and December.  She has been scheduled to start immunotherapy which has been shown to help improve overall survival. She is to start Durvalumab.  She's had some issues with shortness of breath in the past. She has some sinus tachycardia. This is been worked up in the past.  She has some shortness of breath last night. It became somewhat worse today. She went to the emergency room. She's hada cough. She's had no productive cough. She's had no hemoptysis. She's had no fever.  She had a CT angiogram done. The radiologist was certainly not definitive as to what was going on. He thought maybe there was some possible multilobar pneumonia. Of course, he had mentioned the possibility of endobronchial spread of tumor. However, the actual right peritracheal mass was unchanged. There was no tumor extension into the trachea or right mainstem bronchus. There is no airway occlusion. There was no pulmonary emboli.  She has not desaturated. Her oxygen saturation has been between 94 and 100%. She is tachycardic again which is chronic. Pressure has had some slight anemia. She is not neutropenic. She does have hyperglycemia.  She's been given antibiotics in the emergency room.  I was called by the emergency room PA. I was looking through her record. I just wanted to go down to see her  myself to try to help out with any management decisions.  She is very nice. She is very alert. She does have some slight dyspnea. Her oxygen saturation was 98%.  Again, she's had no bleeding. He's been no leg swelling. She's had no abdominal discomfort.       Past Medical History:  Diagnosis Date  . Asthma   . Diabetes mellitus   . Hypercholesteremia   . Hypertension   . Lung cancer ([place])   . Neuropathy ([place])   . Obese   :  Past Surgical History:  Procedure Laterality Date  . CESAREAN SECTION    . IR GENERIC HISTORICAL  01/08/2016   IR US GUIDE VASC ACCESS RIGHT 01/08/2016 [name], DO [facility]  . IR GENERIC HISTORICAL  01/08/2016   IR FLUORO GUIDE CV LINE RIGHT 01/08/2016 [name], DO [facility]  . TUBAL LIGATION    . US guided core needle biopsy     of right lower neck/supraclavicular lymph nodes  :   Current Facility-Administered Medications:  .  iopamidol (ISOVUE-370) 76 % injection, , , ,  .  sodium chloride 0.9 % injection, , , ,   Current Outpatient Prescriptions:  .  aspirin EC 81 MG tablet, Take 1 tablet (81 mg total) by mouth daily., Disp: 60 tablet, Rfl: 2 .  chlorhexidine (PERIDEX) 0.12 % solution, Use as directed 15 mLs in the mouth or throat 2 (two) times daily. (Patient taking differently: Use as directed 15 mLs in the mouth or throat 2 (two) times daily as needed (mouth pain). ), Disp: 120 mL, Rfl: 0 .  HYDROcodone-acetaminophen (HYCET) 7.5-325 mg/15 ml solution, Take 15 mLs by mouth 4 (four) times daily as needed for moderate pain., Disp: 473 mL, Rfl: 0 .  insulin aspart (NOVOLOG) 100 UNIT/ML FlexPen, As per sliding scale instructions (Patient taking differently: Inject 0-20 Units into the skin 3 (three) times daily as needed for high blood sugar. As per sliding scale instructions), Disp: 15 mL, Rfl: 2 .  Insulin Degludec (TRESIBA FLEXTOUCH) 200 UNIT/ML SOPN, Inject 100 Units into the skin daily before breakfast. , Disp: , Rfl:  .  loratadine  (CLARITIN) 10 MG tablet, Take 10 mg by mouth daily., Disp: , Rfl:  .  magic mouthwash w/lidocaine SOLN, Take 5 mLs by mouth 4 (four) times daily as needed for mouth pain., Disp: 400 mL, Rfl: 1 .  metoprolol succinate (TOPROL-XL) 25 MG 24 hr tablet, Take 1 tablet (25 mg total) by mouth daily., Disp: 30 tablet, Rfl: 1 .  nystatin (MYCOSTATIN) 100000 UNIT/ML suspension, Take 5 mLs (500,000 Units total) by mouth 4 (four) times daily. (Patient taking differently: Take 5 mLs by mouth 4 (four) times daily as needed. ), Disp: 200 mL, Rfl: 0 .  promethazine (PHENERGAN) 25 MG tablet, Take 1 tablet (25 mg total) by mouth every 6 (six) hours as needed for nausea or vomiting., Disp: 12 tablet, Rfl: 0 .  sucralfate (CARAFATE) 1 g tablet, Take 1 tablet (1 g total) by mouth 4 (four) times daily -  with meals and at bedtime. 5 min before meals for radiation induced esophagitis (Patient taking differently: Take 1 g by mouth 4 (four) times daily as needed (stomach coating). 5 min before meals for radiation induced esophagitis), Disp: 120 tablet, Rfl: 2 .  Wound Dressings (SONAFINE), Apply 1 application topically 2 (two) times daily., Disp: , Rfl:  .  fluconazole (DIFLUCAN) 100 MG tablet, Take 1 tablet (100 mg total) by mouth daily. (Patient not taking: Reported on 05/14/2016), Disp: 14 tablet, Rfl: 1 .  Fluocinonide 0.1 % CREA, Apply 1 application topically daily. , Disp: , Rfl:  .  levofloxacin (LEVAQUIN) 500 MG tablet, Take 1 tablet (500 mg total) by mouth daily. (Patient not taking: Reported on 05/14/2016), Disp: 10 tablet, Rfl: 0 .  lidocaine-prilocaine (EMLA) cream, Apply to affected area once (Patient not taking: Reported on 05/14/2016), Disp: 30 g, Rfl: 3:  . iopamidol      . sodium chloride      :  No Known Allergies:  Family History  Problem Relation Age of Onset  . Cancer Neg Hx   . Rheumatologic disease Neg Hx   :  Social History   Social History  . Marital status: Divorced    Spouse name: N/A  .  Number of children: N/A  . Years of education: N/A   Occupational History  . Not on file.   Social History Main Topics  . Smoking status: Never Smoker  . Smokeless tobacco: Never Used  . Alcohol use No  . Drug use: No  . Sexual activity: Not Currently   Other Topics Concern  . Not on file   Social History Narrative   No bird or mold exposure. No recent travel.  :  Pertinent items are noted in HPI.  Exam: Patient Vitals for the past 24 hrs:  BP Temp Temp src Pulse Resp SpO2 Height Weight  05/14/16 1724 134/84 98.3 F (36.8 C) Oral (!) 128 19 94 % - -  05/14/16 1530 134/84 - - (!) 130 25 98 % - -  05/14/16  1300 139/84 - - (!) 138 18 96 % - -  05/14/16 1230 142/81 - - (!) 143 20 99 % - -  05/14/16 1200 148/90 - - (!) 140 21 100 % - -  05/14/16 1150 - - - - - - '5\' 9"'$  (1.753 m) 247 lb (112 kg)  05/14/16 1146 163/97 98.1 F (36.7 C) Oral (!) 144 18 98 % - -   Well-developed and well-nourished African-American female. She is in no obvious distress. She does have some slight dyspnea. Her vital signs are temperature 98.3. Her pulse is 128. Respiratory rate 21. Blood pressure 143/81. Oxygen saturation is 98%. Her head and neck exam shows no scleral icterus. She has no oral lesions. Oral mucosa is moist. There is no thrush. She has no adenopathy in her neck. Her lungs show good breath sounds bilaterally. I do not hear any obvious wheezing. She has no rhonchi. She has no crackles. Cardiac exam is tachycardic but regular. There are no murmurs. Abdomen is soft. She has good bowel sounds. There is no palpable liver or spleen tip. Extremities shows no clubbing, cyanosis or edema. No venous cords noted in either leg. She has negative Homans sign. Neurological exam shows no focal neurological deficits.    Recent Labs  05/14/16 1156  WBC 7.5  HGB 9.8*  HCT 29.5*  PLT 377    Recent Labs  05/14/16 1156  NA 140  K 3.5  CL 103  CO2 26  GLUCOSE 259*  BUN 7  CREATININE 0.64  CALCIUM  8.9    Blood smear review:  None  Pathology: None     Assessment and Plan:  [name] is a nice 51 year old African-American female. 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 She's had radiation and chemotherapy. She's been off treatment now for probably close to 2 months.  Is hard to say what might be going on. She may have some early pneumonia. She has been given antibiotics in the emergency room.  I think there may be a possible element of radiation pneumonitis. I will know she has had an issue with this in the past. Possibly, some steroids can help. I know that she is diabetic. Her blood sugar was on the high side today.  She does not have any pulmonary emboli. I do not think this is anything related to malignancy or with spread of tumor. The radiologist certainly was very broad with the possibilities of etiology on the CT scan.  She does not look septic. Her vital signs are relatively stable. Her oxygen saturation looks good.  If the hospitalist feels that she is able to go home, I would agree with this. I think maybe a course of steroids might not hurt. Some antibiotic as an outpatient also might be helpful.  I assisted Dr. Bonner Puna in the evaluation. He felt confident of her being able to go home.  I think she probably needs to follow-up in the cancer clinic on Monday for a reevaluation.  She will need to monitor her blood sugars at home closely.  She has a very strong faith. We had some good fellowship.  Lattie Haw, MD  Darlyn Chamber 17:14

## 2016-05-17 ENCOUNTER — Telehealth: Payer: Self-pay

## 2016-05-17 NOTE — Telephone Encounter (Signed)
Pt went to ER over weekend and they told her to see Dr Irene Limbo today.  She went to PCP and they called ambulance to transport her. She was SOB, ddx was pneumonia and radiation pneumonitis. She was sent home on z-pak and prednisone taper.  Today the SOB is much better. No fever, no cough. Still has a little tightness in her chest.

## 2016-05-19 LAB — CULTURE, BLOOD (ROUTINE X 2)
CULTURE: NO GROWTH
Culture: NO GROWTH

## 2016-05-20 ENCOUNTER — Other Ambulatory Visit: Payer: Self-pay | Admitting: Pharmacist

## 2016-05-20 DIAGNOSIS — C3492 Malignant neoplasm of unspecified part of left bronchus or lung: Secondary | ICD-10-CM

## 2016-05-24 ENCOUNTER — Ambulatory Visit: Payer: Medicaid Other

## 2016-05-24 ENCOUNTER — Ambulatory Visit (HOSPITAL_BASED_OUTPATIENT_CLINIC_OR_DEPARTMENT_OTHER): Payer: Medicaid Other

## 2016-05-24 ENCOUNTER — Encounter: Payer: Self-pay | Admitting: Hematology

## 2016-05-24 ENCOUNTER — Other Ambulatory Visit (HOSPITAL_BASED_OUTPATIENT_CLINIC_OR_DEPARTMENT_OTHER): Payer: Medicaid Other

## 2016-05-24 ENCOUNTER — Other Ambulatory Visit: Payer: Self-pay | Admitting: *Deleted

## 2016-05-24 ENCOUNTER — Ambulatory Visit (HOSPITAL_BASED_OUTPATIENT_CLINIC_OR_DEPARTMENT_OTHER): Payer: Medicaid Other | Admitting: Hematology

## 2016-05-24 VITALS — BP 109/83 | HR 121 | Temp 98.7°F | Resp 18 | Ht 69.0 in | Wt 246.9 lb

## 2016-05-24 DIAGNOSIS — C3492 Malignant neoplasm of unspecified part of left bronchus or lung: Secondary | ICD-10-CM

## 2016-05-24 DIAGNOSIS — C770 Secondary and unspecified malignant neoplasm of lymph nodes of head, face and neck: Secondary | ICD-10-CM

## 2016-05-24 DIAGNOSIS — R000 Tachycardia, unspecified: Secondary | ICD-10-CM

## 2016-05-24 DIAGNOSIS — D630 Anemia in neoplastic disease: Secondary | ICD-10-CM | POA: Diagnosis not present

## 2016-05-24 DIAGNOSIS — Z5112 Encounter for antineoplastic immunotherapy: Secondary | ICD-10-CM

## 2016-05-24 DIAGNOSIS — C3491 Malignant neoplasm of unspecified part of right bronchus or lung: Secondary | ICD-10-CM

## 2016-05-24 DIAGNOSIS — Z79899 Other long term (current) drug therapy: Secondary | ICD-10-CM | POA: Diagnosis not present

## 2016-05-24 DIAGNOSIS — E1165 Type 2 diabetes mellitus with hyperglycemia: Secondary | ICD-10-CM | POA: Diagnosis not present

## 2016-05-24 DIAGNOSIS — E279 Disorder of adrenal gland, unspecified: Secondary | ICD-10-CM

## 2016-05-24 DIAGNOSIS — Z95828 Presence of other vascular implants and grafts: Secondary | ICD-10-CM

## 2016-05-24 DIAGNOSIS — G893 Neoplasm related pain (acute) (chronic): Secondary | ICD-10-CM

## 2016-05-24 DIAGNOSIS — G620 Drug-induced polyneuropathy: Secondary | ICD-10-CM

## 2016-05-24 DIAGNOSIS — E875 Hyperkalemia: Secondary | ICD-10-CM

## 2016-05-24 DIAGNOSIS — I10 Essential (primary) hypertension: Secondary | ICD-10-CM

## 2016-05-24 DIAGNOSIS — T451X5A Adverse effect of antineoplastic and immunosuppressive drugs, initial encounter: Secondary | ICD-10-CM

## 2016-05-24 DIAGNOSIS — K5900 Constipation, unspecified: Secondary | ICD-10-CM

## 2016-05-24 DIAGNOSIS — G8929 Other chronic pain: Secondary | ICD-10-CM

## 2016-05-24 DIAGNOSIS — R0789 Other chest pain: Secondary | ICD-10-CM

## 2016-05-24 LAB — CBC WITH DIFFERENTIAL/PLATELET
BASO%: 0.6 % (ref 0.0–2.0)
BASOS ABS: 0 10*3/uL (ref 0.0–0.1)
EOS ABS: 0.1 10*3/uL (ref 0.0–0.5)
EOS%: 1.4 % (ref 0.0–7.0)
HEMATOCRIT: 28.8 % — AB (ref 34.8–46.6)
HEMOGLOBIN: 9.6 g/dL — AB (ref 11.6–15.9)
LYMPH%: 12.3 % — ABNORMAL LOW (ref 14.0–49.7)
MCH: 28.8 pg (ref 25.1–34.0)
MCHC: 33.2 g/dL (ref 31.5–36.0)
MCV: 86.6 fL (ref 79.5–101.0)
MONO#: 0.4 10*3/uL (ref 0.1–0.9)
MONO%: 7.5 % (ref 0.0–14.0)
NEUT#: 4.6 10*3/uL (ref 1.5–6.5)
NEUT%: 78.2 % — AB (ref 38.4–76.8)
Platelets: 315 10*3/uL (ref 145–400)
RBC: 3.33 10*6/uL — ABNORMAL LOW (ref 3.70–5.45)
RDW: 14.4 % (ref 11.2–14.5)
WBC: 5.9 10*3/uL (ref 3.9–10.3)
lymph#: 0.7 10*3/uL — ABNORMAL LOW (ref 0.9–3.3)

## 2016-05-24 LAB — COMPREHENSIVE METABOLIC PANEL
ALT: 11 U/L (ref 0–55)
ANION GAP: 10 meq/L (ref 3–11)
AST: 16 U/L (ref 5–34)
Albumin: 2.8 g/dL — ABNORMAL LOW (ref 3.5–5.0)
Alkaline Phosphatase: 89 U/L (ref 40–150)
BUN: 6.5 mg/dL — ABNORMAL LOW (ref 7.0–26.0)
CHLORIDE: 103 meq/L (ref 98–109)
CO2: 25 meq/L (ref 22–29)
CREATININE: 0.7 mg/dL (ref 0.6–1.1)
Calcium: 9.4 mg/dL (ref 8.4–10.4)
EGFR: >90 mL/min/{1.73_m2} (ref >90)
GLUCOSE: 337 mg/dL — AB (ref 70–140)
Potassium: 4.1 mEq/L (ref 3.5–5.1)
SODIUM: 138 meq/L (ref 136–145)
Total Bilirubin: 0.4 mg/dL (ref 0.20–1.20)
Total Protein: 7.1 g/dL (ref 6.4–8.3)

## 2016-05-24 LAB — TSH: TSH: 1.628 m[IU]/L (ref 0.308–3.960)

## 2016-05-24 MED ORDER — SENNOSIDES-DOCUSATE SODIUM 8.6-50 MG PO TABS: 2.0000 | ORAL_TABLET | Freq: Two times a day (BID) | ORAL | 1 refills | Status: DC

## 2016-05-24 MED ORDER — DIPHENHYDRAMINE HCL 25 MG PO TABS
25.0000 mg | ORAL_TABLET | Freq: Once | ORAL | Status: AC
  Administered 2016-05-24: 25 mg via ORAL
  Filled 2016-05-24: qty 1

## 2016-05-24 MED ORDER — DULOXETINE HCL 30 MG PO CPEP: 30.0000 mg | ORAL_CAPSULE | Freq: Every day | ORAL | 1 refills | Status: DC

## 2016-05-24 MED ORDER — DILTIAZEM HCL ER COATED BEADS 120 MG PO CP24: 120.0000 mg | ORAL_CAPSULE | Freq: Every day | ORAL | 0 refills | Status: DC

## 2016-05-24 MED ORDER — DIPHENHYDRAMINE HCL 25 MG PO CAPS
ORAL_CAPSULE | ORAL | Status: AC
  Filled 2016-05-24: qty 1

## 2016-05-24 MED ORDER — ASPIRIN EC 81 MG PO TBEC: 81.0000 mg | DELAYED_RELEASE_TABLET | Freq: Every day | ORAL | 2 refills | Status: DC

## 2016-05-24 MED ORDER — ACETAMINOPHEN 325 MG PO TABS
ORAL_TABLET | ORAL | Status: AC
  Filled 2016-05-24: qty 2

## 2016-05-24 MED ORDER — SODIUM CHLORIDE 0.9 % IJ SOLN
10.0000 mL | INTRAMUSCULAR | Status: AC | PRN
  Administered 2016-05-24: 10 mL via INTRAVENOUS
  Filled 2016-05-24: qty 10

## 2016-05-24 MED ORDER — SODIUM CHLORIDE 0.9 % IV SOLN
Freq: Once | INTRAVENOUS | Status: AC
  Administered 2016-05-24: 14:00:00 via INTRAVENOUS

## 2016-05-24 MED ORDER — POLYETHYLENE GLYCOL 3350 17 G PO PACK: 17.0000 g | PACK | Freq: Every day | ORAL | 1 refills | Status: DC

## 2016-05-24 MED ORDER — ACETAMINOPHEN 325 MG PO TABS
650.0000 mg | ORAL_TABLET | Freq: Once | ORAL | Status: AC
  Administered 2016-05-24: 650 mg via ORAL

## 2016-05-24 MED ORDER — SODIUM CHLORIDE 0.9% FLUSH
10.0000 mL | INTRAVENOUS | Status: DC | PRN
  Administered 2016-05-24: 10 mL
  Filled 2016-05-24: qty 10

## 2016-05-24 MED ORDER — SODIUM CHLORIDE 0.9 % IV SOLN
9.6000 mg/kg | Freq: Once | INTRAVENOUS | Status: AC
  Administered 2016-05-24: 1120 mg via INTRAVENOUS
  Filled 2016-05-24: qty 2.4

## 2016-05-24 MED ORDER — HEPARIN SOD (PORK) LOCK FLUSH 100 UNIT/ML IV SOLN
250.0000 [IU] | Freq: Once | INTRAVENOUS | Status: AC | PRN
  Administered 2016-05-24: 250 [IU]
  Filled 2016-05-24: qty 5

## 2016-05-24 NOTE — Progress Notes (Signed)
[name]    HEMATOLOGY/ONCOLOGY CLINIC NOTE  Date of Service: .05/24/2016  Patient Care Team: Nolene Ebbs, MD as PCP - General (Internal Medicine)  CHIEF COMPLAINTS/PURPOSE OF CONSULTATION:  Newly diagnosed Lung Adenocarcinoma.  Diagnosis Stage IIIB Lung Adenocarcinoma  Current treatment: -Starting Maintain Durvalumab from today 05/24/2016  PreviousTreatment -Concurrent Chemo-radiation with Carboplatin/Taxol. Completed RT on 02/13/2016 and has then completed 2 cycles of carboplatin + taxol.  HISTORY OF PRESENTING ILLNESS: Plz see my previous note for details on initial presentation.  INTERVAL HISTORY  Patient is here for her scheduled followup after completion of her planned chemotherapy  And prior to starting her Durvalumab maintenance. She was in the ED for Chest pain again which was noted to be chest wall pain (reproducible with palpation of left costochondral joints). Mother notes that she certainly needs to work on becoming more physically active. Was in the ED on 05/14/2016 for similar chest pain and new shortness of breath and had CTA chest witch concern for pneumonia vs radiation pneumonitis. Was prescribed steroids and antibiotics. Notes her symptoms have resolved. Patient has some scattered wheezes. She notes she has had a h/o ASthma. We discussed and planned to change Metoprolol/toprol XL to Cardizem CR and she is agreeable with this plan.    MEDICAL HISTORY:  Past Medical History:  Diagnosis Date  . Asthma   . Diabetes mellitus   . Hypercholesteremia   . Hypertension   . Lung cancer ([place])   . Neuropathy ([place])   . Obese     SURGICAL HISTORY: Past Surgical History:  Procedure Laterality Date  . CESAREAN SECTION    . IR GENERIC HISTORICAL  01/08/2016   IR US GUIDE VASC ACCESS RIGHT 01/08/2016 [name], DO [facility]  . IR GENERIC HISTORICAL  01/08/2016   IR FLUORO GUIDE CV LINE RIGHT 01/08/2016 [name], DO [facility]  . TUBAL LIGATION    . US guided  core needle biopsy     of right lower neck/supraclavicular lymph nodes    SOCIAL HISTORY: Social History   Social History  . Marital status: Divorced    Spouse name: N/A  . Number of children: N/A  . Years of education: N/A   Occupational History  . Not on file.   Social History Main Topics  . Smoking status: Never Smoker  . Smokeless tobacco: Never Used  . Alcohol use No  . Drug use: No  . Sexual activity: Not Currently   Other Topics Concern  . Not on file   Social History Narrative   No bird or mold exposure. No recent travel.    FAMILY HISTORY: Family History  Problem Relation Age of Onset  . Cancer Neg Hx   . Rheumatologic disease Neg Hx     ALLERGIES:  has No Known Allergies.  MEDICATIONS:  Current Outpatient Prescriptions  Medication Sig Dispense Refill  . azithromycin (ZITHROMAX Z-PAK) 250 MG tablet Take 1 tablet (250 mg total) by mouth daily. 575m PO day 1, then 2563mPO days 205 6 tablet 0  . chlorhexidine (PERIDEX) 0.12 % solution Use as directed 15 mLs in the mouth or throat 2 (two) times daily. (Patient taking differently: Use as directed 15 mLs in the mouth or throat 2 (two) times daily as needed (mouth pain). ) 120 mL 0  . fluconazole (DIFLUCAN) 100 MG tablet Take 1 tablet (100 mg total) by mouth daily. 14 tablet 1  . Fluocinonide 0.1 % CREA Apply 1 application topically daily.     .[name]  HYDROcodone-acetaminophen (HYCET) 7.5-325 mg/15 ml solution Take 15 mLs by mouth 4 (four) times daily as needed for moderate pain. 473 mL 0  . insulin aspart (NOVOLOG) 100 UNIT/ML FlexPen As per sliding scale instructions (Patient taking differently: Inject 0-20 Units into the skin 3 (three) times daily as needed for high blood sugar. As per sliding scale instructions) 15 mL 2  . Insulin Degludec (TRESIBA FLEXTOUCH) 200 UNIT/ML SOPN Inject 100 Units into the skin daily before breakfast.     . lidocaine-prilocaine (EMLA) cream Apply to affected area once 30 g 3  .  loratadine (CLARITIN) 10 MG tablet Take 10 mg by mouth daily.    . magic mouthwash w/lidocaine SOLN Take 5 mLs by mouth 4 (four) times daily as needed for mouth pain. 400 mL 1  . nystatin (MYCOSTATIN) 100000 UNIT/ML suspension Take 5 mLs (500,000 Units total) by mouth 4 (four) times daily. (Patient taking differently: Take 5 mLs by mouth 4 (four) times daily as needed. ) 200 mL 0  . predniSONE (DELTASONE) 20 MG tablet 3 tabs po day one, then 2 tabs daily x 4 days 11 tablet 0  . promethazine (PHENERGAN) 25 MG tablet Take 1 tablet (25 mg total) by mouth every 6 (six) hours as needed for nausea or vomiting. 12 tablet 0  . sucralfate (CARAFATE) 1 g tablet Take 1 tablet (1 g total) by mouth 4 (four) times daily -  with meals and at bedtime. 5 min before meals for radiation induced esophagitis (Patient taking differently: Take 1 g by mouth 4 (four) times daily as needed (stomach coating). 5 min before meals for radiation induced esophagitis) 120 tablet 2  . Wound Dressings (SONAFINE) Apply 1 application topically 2 (two) times daily.    [name] aspirin EC 81 MG tablet Take 1 tablet (81 mg total) by mouth daily. 60 tablet 2  . diltiazem (CARDIZEM CD) 120 MG 24 hr capsule Take 1 capsule (120 mg total) by mouth daily. Start taking this instead of your (Metoprolol/Toprol XL). 30 capsule 0  . DULoxetine (CYMBALTA) 30 MG capsule Take 1 capsule (30 mg total) by mouth daily. 30 capsule 1  . polyethylene glycol (MIRALAX) packet Take 17 g by mouth daily. 30 each 1  . senna-docusate (SENNA S) 8.6-50 MG tablet Take 2 tablets by mouth 2 (two) times daily. May reduce to 2 tab po HS as needed once regular BM estabished 60 tablet 1   No current facility-administered medications for this visit.    Facility-Administered Medications Ordered in Other Visits  Medication Dose Route Frequency Provider Last Rate Last Dose  . durvalumab (IMFINZI) 1,120 mg in sodium chloride 0.9 % 100 mL chemo infusion  9.6 mg/kg (Treatment Plan  Recorded) Intravenous Once [name], MD 122 mL/hr at 05/24/16 1515 1,120 mg at 05/24/16 1515  . heparin lock flush 100 unit/mL  250 Units Intracatheter Once PRN [name], MD      . sodium chloride 0.9 % injection 10 mL  10 mL Intravenous PRN [name], MD   10 mL at 05/24/16 1252  . sodium chloride flush (NS) 0.9 % injection 10 mL  10 mL Intracatheter PRN [name], MD        REVIEW OF SYSTEMS:    10 Point review of Systems was done is negative except as noted above.  PHYSICAL EXAMINATION: ECOG PERFORMANCE STATUS: 1 - Symptomatic but completely ambulatory  . Vitals:   05/24/16 1315  BP: 109/83  Pulse: (!) 121  Resp: 18  Temp: 98.7 F (37.1 C)   Filed Weights   05/24/16 1315  Weight: 246 lb 14.4 oz (112 kg)   .Body mass index is 36.46 kg/m.  GENERAL:alert, in no acute distress and comfortable SKIN: radiation related skin injury over neck - healing EYES: normal, conjunctiva are pink and non-injected, sclera clear OROPHARYNX:oral thrush noted NECK: supple, no JVD, thyroid normal size, non-tender, without nodularity LYMPH:  no palpable lymphadenopathy in the cervical, axillary or inguinal.             LUNGS: clear to auscultation with normal respiratory effort. Distant breath sounds  HEART: regular rate & rhythm,  no murmurs and no lower extremity edema ABDOMEN: abdomen obese, soft, non-tender, normoactive bowel sounds  Musculoskeletal: no cyanosis of digits and no clubbing  PSYCH: alert & oriented x 3 with fluent speech NEURO: no focal motor/sensory deficits  LABORATORY DATA: .  [name] CBC Latest Ref Rng & Units 05/24/2016 05/14/2016 04/26/2016  WBC 3.9 - 10.3 10e3/uL 5.9 7.5 5.0  Hemoglobin 11.6 - 15.9 g/dL 9.6(L) 9.8(L) 8.8(L)  Hematocrit 34.8 - 46.6 % 28.8(L) 29.5(L) 26.5(L)  Platelets 145 - 400 10e3/uL 315 377 260   . CMP Latest Ref Rng & Units 05/24/2016 05/14/2016 04/26/2016  Glucose 70 - 140 mg/dl 337(H) 259(H) 195(H)  BUN 7.0 -  26.0 mg/dL 6.5(L) 7 4.7(L)  Creatinine 0.6 - 1.1 mg/dL 0.7 0.64 0.7  Sodium 136 - 145 mEq/L 138 140 142  Potassium 3.5 - 5.1 mEq/L 4.1 3.5 3.6  Chloride 101 - 111 mmol/L - 103 -  CO2 22 - 29 mEq/L _0 Calcium 8.4 - 10.4 mg/dL 9.4 8.9 8.7  Total Protein 6.4 - 8.3 g/dL 7.1 - 6.9  Total Bilirubin 0.20 - 1.20 mg/dL 0.40 - 0.36  Alkaline Phos 40 - 150 U/L 89 - 74  AST 5 - 34 U/L 16 - 10  ALT 0 - 55 U/L 11 - 7              RADIOGRAPHIC STUDIES: I have personally reviewed the radiological images as listed and agreed with the findings in the report. PET/CT IMPRESSION: 1. Hypermetabolic 1.7 cm peripheral right hilar nodule. Given the peripheral location of this nodule within the right hilum, this could potentially represent a centrally located primary bronchogenic malignancy. 2. Hypermetabolic bulky ipsilateral paratracheal, bilateral prevascular mediastinal and bilateral scalene nodal metastases. 3. Hypermetabolic right lower neck nodal metastases involving right neck nodal levels III, IV and V. 4. No hypermetabolic metastatic disease in the abdomen, pelvis or skeleton. 5. Aortic atherosclerosis.   Electronically Signed   By: Ilona Sorrel M.D.   On: 01/12/2016 09:42    CTA chest 04/16/2016  IMPRESSION: 1. No evidence of a pulmonary embolism. 2. New, 16 mm, focal area of ground-glass opacity in the right upper lobe. This may reflect metastatic disease. It may be inflammatory/infectious in origin. 3. There is a less well-defined but otherwise similar appearing area of opacity in the superior segment right lower lobe, which has increased from the prior chest CT. This also may reflect metastatic disease or be infectious or inflammatory in etiology. 4. There has been interval improvement in the lung carcinoma. The bulky right paratracheal mass has decreased in size. The supraclavicular adenopathy has significantly decreased with no residual enlarged lymph  nodes.   Electronically Signed   By: Lajean Manes M.D.   On: 04/16/2016 12:02  CTA chest 05/14/2016  IMPRESSION: 1. No filling defect is identified in the  pulmonary arterial tree to suggest pulmonary embolus. 2. Primarily dependent and sharply defined hazy airspace opacities in the right upper lobe and right lower lobe, with some minimal involvement of the left upper lobe and posterior portion of the right middle lobe. At the margins these mostly conform to secondary pulmonary lobules. Appearance suspicious for multilobar pneumonia, aspiration pneumonia might have a similar appearance. Endobronchial spread of tumor might conceivably have a similar appearance; the right paratracheal mass is not appreciably changed in size or morphology and there is no obvious tumor extension into the adjacent trachea or right mainstem bronchus, and no airway occlusion is observed.   Electronically Signed   By: Van Clines M.D.   On: 05/14/2016 14:47   ASSESSMENT & PLAN:   51 year old African-American female with history of hypertension, diabetes, asthma, dysuria mass and morbid obesity with   #1 Lung Adenocarcinoma Rt sided atleast Stage IIIB with large right paratracheal mass with mediastinal adenopathy that appears to have grown significantly over the last 6-7 months and rt supraclavicular LN +.   Noted to have a small mass in the left adrenal on CT but PET/CT neg for metastatic disease. Patient has been a lifelong nonsmoker  MRI of the brain was negative for any metastatic disease.  High PDL1 expression (90%) on [facility] Neg for EGFR, ALK, ROS-1 and BRAF mutations.  Patient has completed her planned definitive chemoradiation with carbo Taxol on 02/13/2016. No prohibitive toxicities other than some grade 1 skin desquamation and some grade 1-2 radiation esophagitis. She has subsequently received 2 out of 2 planned dose of carboplatin + Taxol.  #2 resolving  pneumonia/radiation pneumonitis . #3 resolving fatigue from chemotherapy  #4 normocytic anemia related to chemotherapy  #5 Left chest wall pain due to costochondritis - mx with pain medications. CTA x 2 neg for PE Plan -breathing has improved with steroids and Antibiotics -labs stable. -patient would like to proceed with maintenance Durvalumab starting today -counseled regarding importance of daily exercise/activity -I shall see her back in 7 days for a toxicity check -recommended maintenance of good po hydration and food intake  #2 Sinus tachycardia - on toprol XL , no PE on CTA x 2 Plan -we will switch from Toprol XL to Cardizem CR given h/o asthma with some scattered wheezing. Maintain good hydration.   #3 neoplasm related pain is currently controlled without significant medications. Clinically likely has sleep apnea . Would need to be careful with sedative medications . Plan -carafate and prn magic mouthwash for radiation esophagitis related symptoms. #3 hypertension   #4 diabetes #5 dyslipidemia #6 asthma Plan - insulin SS for uncontrolled hyperglycemia in the setting of steroid use with chemotherapy. 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  -continue treatment as per orders Durvalumab q2weeks starting today -RTC with Dr KIrene Limbofor toxicity check in 1 week with labs  All of the patients questions were answered with apparent satisfaction. The patient knows to call the clinic with any problems, questions or concerns.  I spent 30 minutes counseling the patient face to face. The total time spent in the appointment was 40 minutes and more than 50% was on counseling and direct patient cares.    GSullivan LoneMD MS AAHIVMS SLake Regional Health SystemCSumma Wadsworth-Rittman HospitalHematology/Oncology Physician [facility]  [place]  (Office):       [phone] (Work cell):  [phone] (Fax):           [phone]

## 2016-05-24 NOTE — Patient Instructions (Signed)
Durvalumab injection What is this medicine? DURVALUMAB (dur VAL ue mab) is a monoclonal antibody. It is used to treat urothelial cancer. This medicine may be used for other purposes; ask your health care provider or pharmacist if you have questions. COMMON BRAND NAME(S): IMFINZI What should I tell my health care provider before I take this medicine? They need to know if you have any of these conditions: -diabetes -immune system problems -infection -inflammatory bowel disease -kidney disease -liver disease -lung or breathing disease -lupus -organ transplant -stomach or intestine problems -thyroid disease -an unusual or allergic reaction to durvalumab, other medicines, foods, dyes, or preservatives -pregnant or trying to get pregnant -breast-feeding How should I use this medicine? This medicine is for infusion into a vein. It is given by a health care professional in a hospital or clinic setting. A special MedGuide will be given to you before each treatment. Be sure to read this information carefully each time. Talk to your pediatrician regarding the use of this medicine in children. Special care may be needed. Overdosage: If you think you have taken too much of this medicine contact a poison control center or emergency room at once. NOTE: This medicine is only for you. Do not share this medicine with others. What if I miss a dose? It is important not to miss your dose. Call your doctor or health care professional if you are unable to keep an appointment. What may interact with this medicine? Interactions have not been studied. This list may not describe all possible interactions. Give your health care provider a list of all the medicines, herbs, non-prescription drugs, or dietary supplements you use. Also tell them if you smoke, drink alcohol, or use illegal drugs. Some items may interact with your medicine. What should I watch for while using this medicine? This drug may make you  feel generally unwell. Continue your course of treatment even though you feel ill unless your doctor tells you to stop. You may need blood work done while you are taking this medicine. Do not become pregnant while taking this medicine or for 3 months after stopping it. Women should inform their doctor if they wish to become pregnant or think they might be pregnant. There is a potential for serious side effects to an unborn child. Talk to your health care professional or pharmacist for more information. Do not breast-feed an infant while taking this medicine or for 3 months after stopping it. What side effects may I notice from receiving this medicine? Side effects that you should report to your doctor or health care professional as soon as possible: -allergic reactions like skin rash, itching or hives, swelling of the face, lips, or tongue -black, tarry stools -bloody or watery diarrhea -breathing problems -change in emotions or moods -change in sex drive -changes in vision -chest pain or chest tightness -chills -confusion -cough -facial flushing -fever -headache -signs and symptoms of high blood sugar such as dizziness; dry mouth; dry skin; fruity breath; nausea; stomach pain; increased hunger or thirst; increased urination -signs and symptoms of liver injury like dark yellow or brown urine; general ill feeling or flu-like symptoms; light-colored stools; loss of appetite; nausea; right upper belly pain; unusually weak or tired; yellowing of the eyes or skin -stomach pain -trouble passing urine or change in the amount of urine -weight gain or weight loss Side effects that usually do not require medical attention (report these to your doctor or health care professional if they continue or are bothersome): -bone pain -  constipation -loss of appetite -muscle pain -nausea -swelling of the ankles, feet, hands -tiredness This list may not describe all possible side effects. Call your doctor  for medical advice about side effects. You may report side effects to FDA at 1-800-FDA-1088. Where should I keep my medicine? This drug is given in a hospital or clinic and will not be stored at home. NOTE: This sheet is a summary. It may not cover all possible information. If you have questions about this medicine, talk to your doctor, pharmacist, or health care provider.  2017 Elsevier/Gold Standard (2015-10-24 15:50:36)

## 2016-05-24 NOTE — Patient Instructions (Addendum)
-  for neuropathy -- Cymbalta started '30mg'$  po daily. (as counseled hold medications if any evidence of suidicidal or homicidal thoughts) -Constipation - Senna-S + Miralax -sent to your pharmacy. -switch Toprol XL/Metoprolol to Cardizem ( -sent to your pharmacy) -maintain good hydration -f/u with PCP to optimize diabetes management. -walk atleast 30 mins daily. This will help your fatigue more than anything else. -may use OTC NSAID pain medications for costochondritis pain

## 2016-05-25 ENCOUNTER — Telehealth: Payer: Self-pay | Admitting: *Deleted

## 2016-05-25 NOTE — Telephone Encounter (Signed)
Per 2/19 LOS and staff message I have scheduled appts. Notified the scheduler

## 2016-06-02 ENCOUNTER — Ambulatory Visit: Payer: Medicaid Other

## 2016-06-02 ENCOUNTER — Ambulatory Visit (HOSPITAL_BASED_OUTPATIENT_CLINIC_OR_DEPARTMENT_OTHER): Payer: Medicaid Other | Admitting: Hematology

## 2016-06-02 ENCOUNTER — Other Ambulatory Visit (HOSPITAL_BASED_OUTPATIENT_CLINIC_OR_DEPARTMENT_OTHER): Payer: Medicaid Other

## 2016-06-02 ENCOUNTER — Encounter: Payer: Self-pay | Admitting: Hematology

## 2016-06-02 VITALS — BP 133/89 | HR 111 | Temp 99.1°F | Resp 19 | Wt 240.4 lb

## 2016-06-02 DIAGNOSIS — E1140 Type 2 diabetes mellitus with diabetic neuropathy, unspecified: Secondary | ICD-10-CM

## 2016-06-02 DIAGNOSIS — R0789 Other chest pain: Secondary | ICD-10-CM

## 2016-06-02 DIAGNOSIS — C3491 Malignant neoplasm of unspecified part of right bronchus or lung: Secondary | ICD-10-CM | POA: Diagnosis present

## 2016-06-02 DIAGNOSIS — I10 Essential (primary) hypertension: Secondary | ICD-10-CM | POA: Diagnosis not present

## 2016-06-02 DIAGNOSIS — E279 Disorder of adrenal gland, unspecified: Secondary | ICD-10-CM

## 2016-06-02 DIAGNOSIS — C770 Secondary and unspecified malignant neoplasm of lymph nodes of head, face and neck: Secondary | ICD-10-CM | POA: Diagnosis not present

## 2016-06-02 DIAGNOSIS — D6481 Anemia due to antineoplastic chemotherapy: Secondary | ICD-10-CM | POA: Diagnosis not present

## 2016-06-02 DIAGNOSIS — G893 Neoplasm related pain (acute) (chronic): Secondary | ICD-10-CM | POA: Diagnosis not present

## 2016-06-02 DIAGNOSIS — G4700 Insomnia, unspecified: Secondary | ICD-10-CM

## 2016-06-02 DIAGNOSIS — R630 Anorexia: Secondary | ICD-10-CM

## 2016-06-02 DIAGNOSIS — E1165 Type 2 diabetes mellitus with hyperglycemia: Secondary | ICD-10-CM

## 2016-06-02 DIAGNOSIS — G620 Drug-induced polyneuropathy: Secondary | ICD-10-CM | POA: Diagnosis not present

## 2016-06-02 DIAGNOSIS — K5900 Constipation, unspecified: Secondary | ICD-10-CM

## 2016-06-02 DIAGNOSIS — C3492 Malignant neoplasm of unspecified part of left bronchus or lung: Secondary | ICD-10-CM

## 2016-06-02 DIAGNOSIS — R000 Tachycardia, unspecified: Secondary | ICD-10-CM

## 2016-06-02 DIAGNOSIS — Z95828 Presence of other vascular implants and grafts: Secondary | ICD-10-CM

## 2016-06-02 DIAGNOSIS — T451X5A Adverse effect of antineoplastic and immunosuppressive drugs, initial encounter: Secondary | ICD-10-CM

## 2016-06-02 LAB — COMPREHENSIVE METABOLIC PANEL
ALBUMIN: 2.8 g/dL — AB (ref 3.5–5.0)
ALK PHOS: 91 U/L (ref 40–150)
ANION GAP: 10 meq/L (ref 3–11)
AST: 10 U/L (ref 5–34)
BILIRUBIN TOTAL: 0.43 mg/dL (ref 0.20–1.20)
BUN: 6.1 mg/dL — ABNORMAL LOW (ref 7.0–26.0)
CALCIUM: 9.5 mg/dL (ref 8.4–10.4)
CO2: 27 mEq/L (ref 22–29)
CREATININE: 0.7 mg/dL (ref 0.6–1.1)
Chloride: 101 mEq/L (ref 98–109)
EGFR: >90 mL/min/{1.73_m2} (ref >90)
Glucose: 266 mg/dl — ABNORMAL HIGH (ref 70–140)
Potassium: 3.4 mEq/L — ABNORMAL LOW (ref 3.5–5.1)
Sodium: 138 mEq/L (ref 136–145)
TOTAL PROTEIN: 7.5 g/dL (ref 6.4–8.3)

## 2016-06-02 LAB — CBC WITH DIFFERENTIAL/PLATELET
BASO%: 0 % (ref 0.0–2.0)
Basophils Absolute: 0 10*3/uL (ref 0.0–0.1)
EOS ABS: 0 10*3/uL (ref 0.0–0.5)
EOS%: 0.7 % (ref 0.0–7.0)
HEMATOCRIT: 29.4 % — AB (ref 34.8–46.6)
HGB: 9.8 g/dL — ABNORMAL LOW (ref 11.6–15.9)
LYMPH#: 0.9 10*3/uL (ref 0.9–3.3)
LYMPH%: 14 % (ref 14.0–49.7)
MCH: 28 pg (ref 25.1–34.0)
MCHC: 33.3 g/dL (ref 31.5–36.0)
MCV: 84 fL (ref 79.5–101.0)
MONO#: 0.4 10*3/uL (ref 0.1–0.9)
MONO%: 5.7 % (ref 0.0–14.0)
NEUT%: 79.6 % — ABNORMAL HIGH (ref 38.4–76.8)
NEUTROS ABS: 4.9 10*3/uL (ref 1.5–6.5)
Platelets: 361 10*3/uL (ref 145–400)
RBC: 3.5 10*6/uL — ABNORMAL LOW (ref 3.70–5.45)
RDW: 13.1 % (ref 11.2–14.5)
WBC: 6.1 10*3/uL (ref 3.9–10.3)

## 2016-06-02 MED ORDER — DRONABINOL 2.5 MG PO CAPS: 2.5000 mg | ORAL_CAPSULE | Freq: Two times a day (BID) | ORAL | 0 refills | Status: DC

## 2016-06-02 MED ORDER — HEPARIN SOD (PORK) LOCK FLUSH 100 UNIT/ML IV SOLN
500.0000 [IU] | Freq: Once | INTRAVENOUS | Status: AC | PRN
  Administered 2016-06-02: 250 [IU] via INTRAVENOUS
  Filled 2016-06-02: qty 5

## 2016-06-02 MED ORDER — SODIUM CHLORIDE 0.9 % IJ SOLN
10.0000 mL | INTRAMUSCULAR | Status: DC | PRN
  Administered 2016-06-02: 10 mL via INTRAVENOUS
  Filled 2016-06-02: qty 10

## 2016-06-02 MED ORDER — TRAZODONE HCL 50 MG PO TABS: 50.0000 mg | ORAL_TABLET | Freq: Every evening | ORAL | 0 refills | Status: DC | PRN

## 2016-06-04 ENCOUNTER — Encounter: Payer: Self-pay | Admitting: Hematology

## 2016-06-04 NOTE — Progress Notes (Signed)
Submitted auth request for Dronabinol today.

## 2016-06-07 ENCOUNTER — Ambulatory Visit: Payer: Medicaid Other

## 2016-06-07 ENCOUNTER — Encounter: Payer: Self-pay | Admitting: Hematology

## 2016-06-07 ENCOUNTER — Telehealth: Payer: Self-pay | Admitting: *Deleted

## 2016-06-07 ENCOUNTER — Other Ambulatory Visit: Payer: Medicaid Other

## 2016-06-07 NOTE — Telephone Encounter (Signed)
Pt called stating she wanted to hold off on treatment today, and wait until she gets her strength back.  Reviewed with Dr. Irene Limbo, per MD this is a reasonable request.  Let patient know that is ok for her to hold off on treatment, instructed patient to call cancer center when she is ready to reschedule.  Informed pt that her next scheduled apt is 3/19.  Pt verbalized understanding.

## 2016-06-07 NOTE — Progress Notes (Signed)
Dronabinol was approved from 06/04/16 - 05/30/17.  Auth #: [id number].

## 2016-06-10 ENCOUNTER — Ambulatory Visit: Payer: Medicaid Other | Attending: Hematology | Admitting: Physical Therapy

## 2016-06-10 DIAGNOSIS — M6281 Muscle weakness (generalized): Secondary | ICD-10-CM | POA: Insufficient documentation

## 2016-06-10 DIAGNOSIS — R262 Difficulty in walking, not elsewhere classified: Secondary | ICD-10-CM | POA: Insufficient documentation

## 2016-06-10 DIAGNOSIS — Z95828 Presence of other vascular implants and grafts: Secondary | ICD-10-CM | POA: Insufficient documentation

## 2016-06-14 NOTE — Therapy (Signed)
[place], [place], [address] Phone: [phone]   Fax:  [phone]  Physical Therapy Evaluation  Patient Details  Name: Marilyn [name] MRN: [id number] Date of Birth: 1965/05/29 Referring Provider: Dr. Sullivan Lone  Encounter Date: 06/10/2016      PT End of Session - 06/14/16 1211    Visit Number 1   Number of Visits 8   Date for PT Re-Evaluation 07/08/16   Activity Tolerance Patient tolerated treatment well   Behavior During Therapy [facility] for tasks assessed/performed      Past Medical History:  Diagnosis Date  . Asthma   . Diabetes mellitus   . Hypercholesteremia   . Hypertension   . Lung cancer ([place])   . Neuropathy ([place])   . Obese     Past Surgical History:  Procedure Laterality Date  . CESAREAN SECTION    . IR GENERIC HISTORICAL  01/08/2016   IR US GUIDE VASC ACCESS RIGHT 01/08/2016 [name], DO [facility]  . IR GENERIC HISTORICAL  01/08/2016   IR FLUORO GUIDE CV LINE RIGHT 01/08/2016 [name], DO [facility]  . TUBAL LIGATION    . US guided core needle biopsy     of right lower neck/supraclavicular lymph nodes    There were no vitals filed for this visit.       Subjective Assessment - 06/14/16 1204    Subjective Patient reports she was diagnosed with Stage 3 lung cancer in 9/17. She began radiation and chemotherapy at that time but has completed both of those now. She reports she completed treatment 04/01/17 when chemo ended. She had a CT scan in 1/18 which showed her cancer is gone.   Pertinent History Lung cancer (reports being in remission), diabetes, hypertension, asthma.   How long can you sit comfortably? As long as she wants   How long can you stand comfortably? 5 minutes   How long can you walk comfortably? 2 minutes   Patient Stated Goals Return to walking to grocery shop without a cart, cook her own meals, clean her house, do laundry, walk up 13 steps to go to  church   Currently in Pain? No/denies            [facility] PT Assessment - 06/14/16 0001      Assessment   Medical Diagnosis Lung cancer   Onset Date/Surgical Date 12/04/16   Hand Dominance Right   Next MD Visit 06/21/16   Prior Therapy none     Precautions   Precautions Other (comment)   Precaution Comments fall risk     Restrictions   Weight Bearing Restrictions No     Balance Screen   Has the patient fallen in the past 6 months No   Has the patient had a decrease in activity level because of a fear of falling?  No   Is the patient reluctant to leave their home because of a fear of falling?  No     Home Environment   Living Environment Private residence   Living Arrangements Children;Parent  Mother and 27 y.o. son   Type of [place] to enter   Entrance Stairs-Number of Steps 5   Entrance Stairs-Rails Right;Left;Cannot reach both   [place] One level   [place] - 4 wheels  Uses walker to grocery shop if cart not available     Prior Function   Level of Independence Independent with household mobility  without device;Independent with community mobility with device   Vocation On disability   Leisure Does not exercise; lays around most of day     Cognition   Overall Cognitive Status Within Functional Limits for tasks assessed     Functional Tests   Functional tests Sit to Stand     Sit to Stand   Comments Unable to perform sit to stand without use of BUE     Posture/Postural Control   Posture/Postural Control Postural limitations   Postural Limitations Forward head;Rounded Shoulders     ROM / Strength   AROM / PROM / Strength AROM;Strength     Strength   Overall Strength Comments BUE grossly 4-/5; poor bilateral grip strength with right being 8# and left being 5# in 3rd position   Strength Assessment Site Knee;Hip   Right/Left Hip Right;Left   Right Hip Flexion 3+/5   Right Hip ABduction 4-/5   Right Hip ADduction 4-/5    Left Hip Flexion 3+/5   Left Hip ABduction 4-/5   Left Hip ADduction 4-/5   Right/Left Knee Right;Left   Right Knee Flexion 4-/5   Right Knee Extension 4-/5   Left Knee Flexion 4+/5   Left Knee Extension 4+/5     Balance   Balance Assessed Yes     Standardized Balance Assessment   Standardized Balance Assessment Berg Balance Test     Berg Balance Test   Sit to Stand Able to stand using hands after several tries   Standing Unsupported Able to stand 2 minutes with supervision   Sitting with Back Unsupported but Feet Supported on Floor or Stool Able to sit safely and securely 2 minutes   Stand to Sit Controls descent by using hands   Transfers Able to transfer safely, definite need of hands   Standing Unsupported with Eyes Closed Able to stand 10 seconds with supervision   Standing Ubsupported with Feet Together Able to place feet together independently and stand for 1 minute with supervision   From Standing, Reach Forward with Outstretched Arm Can reach forward >5 cm safely (2")   From Standing Position, Pick up Object from Floor Unable to pick up shoe, but reaches 2-5 cm (1-2") from shoe and balances independently   From Standing Position, Turn to Look Behind Over each Shoulder Turn sideways only but maintains balance   Turn 360 Degrees Able to turn 360 degrees safely but slowly   Standing Unsupported, Alternately Place Feet on Step/Stool Able to stand independently and complete 8 steps >20 seconds   Standing Unsupported, One Foot in Front Able to plae foot ahead of the other independently and hold 30 seconds   Standing on One Leg Able to lift leg independently and hold equal to or more than 3 seconds   Total Score 37           LYMPHEDEMA/ONCOLOGY QUESTIONNAIRE - 06/14/16 1209      Type   Cancer Type Lung cancer     Treatment   Active Chemotherapy Treatment No   Past Chemotherapy Treatment Yes   Date 04/01/17   Active Radiation Treatment No   Past Radiation Treatment  Yes   Body Site chest   Current Hormone Treatment No   Past Hormone Therapy No                        PT Education - 06/14/16 1210    Education provided Yes   Education Details Educated pt and her  mother on a progressive walking program; encouraged her to get up and walk for 5 minutes every 30-60 minutes.   Person(s) Educated Patient;Parent(s)   Methods Explanation   Comprehension Verbalized understanding                Long Term Clinic Goals - 06/14/16 1156      [place] Term Goal  #1   Title Patient will be able to increase bilateral leg strength to >/= 4/5 for improved function and safety with ambulation.   Time 4   Period Weeks   Status New     CC Long Term Goal  #2   Title Patient will be independent with a walking program and home exercise program.   Time 4   Period Weeks   Status New     CC Long Term Goal  #3   Title Patient will increase BERG balance test score to >/= 45/56 for improved safety and decreased fall risk.   Time 4   Period Weeks   Status New     CC Long Term Goal  #4   Title Patient will report she has returned to grocery shopping for >/= 15 minutes without use of an electric scooter.   Time 4   Period Weeks   Status New            Plan - 06/14/16 1211    Rehab Potential Good   Clinical Impairments Affecting Rehab Potential Insurance limitations   PT Frequency 2x / week   PT Duration 4 weeks   PT Treatment/Interventions ADLs/Self Care Home Management;Neuromuscular re-education;Balance training;Therapeutic exercise;Therapeutic activities;Functional mobility training;Gait training;Patient/family education   PT Next Visit Plan Strengthening and balance exercises once we determine how we can treat this pt with her insurance restrictions.   PT Home Exercise Plan Walking program   Consulted and Agree with Plan of Care Patient;Family member/caregiver   Family Member Consulted mom      Patient will benefit from skilled  therapeutic intervention in order to improve the following deficits and impairments:  Abnormal gait, Decreased activity tolerance, Decreased balance, Decreased range of motion, Decreased mobility, Decreased endurance, Decreased strength, Difficulty walking, Impaired UE functional use, Postural dysfunction  Visit Diagnosis: Muscle weakness (generalized) - Plan: PT plan of care cert/re-cert  Difficulty in walking, not elsewhere classified - Plan: PT plan of care cert/re-cert     Problem List Patient Active Problem List   Diagnosis Date Noted  . Community acquired pneumonia   . Sinus tachycardia   . Thrush of mouth and esophagus ([place]) 04/07/2016  . Constipation 04/07/2016  . Palliative care by specialist   . Port catheter in place 01/19/2016  . Dehydration 01/14/2016  . Tachycardia 01/14/2016  . Adenocarcinoma of left lung, stage 3 ([place]) 01/01/2016  . Non-small cell lung cancer ([place]) 12/31/2015  . Mediastinal mass   . Chest wall pain, chronic 12/26/2015  . Headache 12/26/2015  . Diabetes mellitus ([place]) 12/26/2015    Annia Friendly, PT 06/14/16 12:13 PM  [place], [place], [address] Phone: [phone]   Fax:  [phone]  Name: Marilyn [name] MRN: [id number] Date of Birth: [birth year]   NOTE: This note was created on 06/14/16. Patient was seen originally on 06/10/16 but the note was not completed until today as we were trying to determine if we could treat her due to insurance constraints. Annia Friendly, [place] 06/14/16 12:14 PM

## 2016-06-16 ENCOUNTER — Telehealth: Payer: Self-pay | Admitting: Physical Therapy

## 2016-06-16 NOTE — Telephone Encounter (Signed)
I phoned Marilyn [name] to discuss physical therapy options as her insurance will not cover any PT. She is able to go to the free clinic at [facility] and will work on getting family and friends to transport her there. She plans to stop by our clinic today to sign a medical release form and documentation of her eval and her referral will be faxed to that clinic tomorrow. Annia Friendly, [place] 06/16/16 11:26 AM

## 2016-06-21 ENCOUNTER — Other Ambulatory Visit (HOSPITAL_BASED_OUTPATIENT_CLINIC_OR_DEPARTMENT_OTHER): Payer: Medicaid Other

## 2016-06-21 ENCOUNTER — Ambulatory Visit (HOSPITAL_BASED_OUTPATIENT_CLINIC_OR_DEPARTMENT_OTHER): Payer: Medicaid Other

## 2016-06-21 ENCOUNTER — Ambulatory Visit: Payer: Medicaid Other

## 2016-06-21 VITALS — BP 132/90 | HR 132 | Temp 98.3°F | Resp 16

## 2016-06-21 DIAGNOSIS — C3491 Malignant neoplasm of unspecified part of right bronchus or lung: Secondary | ICD-10-CM

## 2016-06-21 DIAGNOSIS — C770 Secondary and unspecified malignant neoplasm of lymph nodes of head, face and neck: Secondary | ICD-10-CM

## 2016-06-21 DIAGNOSIS — Z5112 Encounter for antineoplastic immunotherapy: Secondary | ICD-10-CM

## 2016-06-21 DIAGNOSIS — C3492 Malignant neoplasm of unspecified part of left bronchus or lung: Secondary | ICD-10-CM

## 2016-06-21 DIAGNOSIS — E1165 Type 2 diabetes mellitus with hyperglycemia: Secondary | ICD-10-CM | POA: Diagnosis not present

## 2016-06-21 DIAGNOSIS — Z95828 Presence of other vascular implants and grafts: Secondary | ICD-10-CM

## 2016-06-21 DIAGNOSIS — E0800 Diabetes mellitus due to underlying condition with hyperosmolarity without nonketotic hyperglycemic-hyperosmolar coma (NKHHC): Secondary | ICD-10-CM

## 2016-06-21 DIAGNOSIS — E119 Type 2 diabetes mellitus without complications: Secondary | ICD-10-CM

## 2016-06-21 LAB — CBC WITH DIFFERENTIAL/PLATELET
BASO%: 0.2 % (ref 0.0–2.0)
BASOS ABS: 0 10*3/uL (ref 0.0–0.1)
EOS ABS: 0.1 10*3/uL (ref 0.0–0.5)
EOS%: 1.7 % (ref 0.0–7.0)
HCT: 30 % — ABNORMAL LOW (ref 34.8–46.6)
HEMOGLOBIN: 9.7 g/dL — AB (ref 11.6–15.9)
LYMPH%: 17.2 % (ref 14.0–49.7)
MCH: 27.4 pg (ref 25.1–34.0)
MCHC: 32.3 g/dL (ref 31.5–36.0)
MCV: 84.7 fL (ref 79.5–101.0)
MONO#: 0.4 10*3/uL (ref 0.1–0.9)
MONO%: 8.2 % (ref 0.0–14.0)
NEUT%: 72.7 % (ref 38.4–76.8)
NEUTROS ABS: 3.8 10*3/uL (ref 1.5–6.5)
PLATELETS: 279 10*3/uL (ref 145–400)
RBC: 3.54 10*6/uL — ABNORMAL LOW (ref 3.70–5.45)
RDW: 13.5 % (ref 11.2–14.5)
WBC: 5.2 10*3/uL (ref 3.9–10.3)
lymph#: 0.9 10*3/uL (ref 0.9–3.3)

## 2016-06-21 LAB — COMPREHENSIVE METABOLIC PANEL
ALK PHOS: 88 U/L (ref 40–150)
ALT: 10 U/L (ref 0–55)
AST: 11 U/L (ref 5–34)
Albumin: 2.9 g/dL — ABNORMAL LOW (ref 3.5–5.0)
Anion Gap: 12 mEq/L — ABNORMAL HIGH (ref 3–11)
BILIRUBIN TOTAL: 0.27 mg/dL (ref 0.20–1.20)
BUN: 5.6 mg/dL — ABNORMAL LOW (ref 7.0–26.0)
CO2: 24 mEq/L (ref 22–29)
Calcium: 9 mg/dL (ref 8.4–10.4)
Chloride: 103 mEq/L (ref 98–109)
Creatinine: 0.7 mg/dL (ref 0.6–1.1)
GLUCOSE: 427 mg/dL — AB (ref 70–140)
POTASSIUM: 3.7 meq/L (ref 3.5–5.1)
SODIUM: 138 meq/L (ref 136–145)
TOTAL PROTEIN: 7.1 g/dL (ref 6.4–8.3)

## 2016-06-21 LAB — WHOLE BLOOD GLUCOSE
GLUCOSE: 266 mg/dL — AB (ref 70–100)
HRS PC: 4 h

## 2016-06-21 MED ORDER — DIPHENHYDRAMINE HCL 25 MG PO TABS
25.0000 mg | ORAL_TABLET | Freq: Once | ORAL | Status: AC
  Administered 2016-06-21: 25 mg via ORAL
  Filled 2016-06-21: qty 1

## 2016-06-21 MED ORDER — ACETAMINOPHEN 325 MG PO TABS
650.0000 mg | ORAL_TABLET | Freq: Once | ORAL | Status: AC
  Administered 2016-06-21: 650 mg via ORAL

## 2016-06-21 MED ORDER — SODIUM CHLORIDE 0.9 % IJ SOLN
10.0000 mL | INTRAMUSCULAR | Status: DC | PRN
  Administered 2016-06-21: 10 mL via INTRAVENOUS
  Filled 2016-06-21: qty 10

## 2016-06-21 MED ORDER — HEPARIN SOD (PORK) LOCK FLUSH 100 UNIT/ML IV SOLN
500.0000 [IU] | Freq: Once | INTRAVENOUS | Status: AC | PRN
Start: 2016-06-21 — End: 2016-06-21
  Administered 2016-06-21: 250 [IU]
  Filled 2016-06-21: qty 5

## 2016-06-21 MED ORDER — SODIUM CHLORIDE 0.9 % IV SOLN
1120.0000 mg | Freq: Once | INTRAVENOUS | Status: AC
  Administered 2016-06-21: 1120 mg via INTRAVENOUS
  Filled 2016-06-21: qty 20

## 2016-06-21 MED ORDER — SODIUM CHLORIDE 0.9 % IV SOLN: 10.0000 mg/kg | Freq: Once | INTRAVENOUS | Status: DC

## 2016-06-21 MED ORDER — DIPHENHYDRAMINE HCL 25 MG PO CAPS
ORAL_CAPSULE | ORAL | Status: AC
  Filled 2016-06-21: qty 1

## 2016-06-21 MED ORDER — ACETAMINOPHEN 325 MG PO TABS
ORAL_TABLET | ORAL | Status: AC
  Filled 2016-06-21: qty 2

## 2016-06-21 MED ORDER — SODIUM CHLORIDE 0.9% FLUSH
10.0000 mL | INTRAVENOUS | Status: DC | PRN
  Administered 2016-06-21: 10 mL
  Filled 2016-06-21: qty 10

## 2016-06-21 MED ORDER — SODIUM CHLORIDE 0.9 % IV SOLN
Freq: Once | INTRAVENOUS | Status: AC
  Administered 2016-06-21: 13:00:00 via INTRAVENOUS

## 2016-06-21 MED ORDER — INSULIN REGULAR HUMAN 100 UNIT/ML IJ SOLN
20.0000 [IU] | Freq: Once | INTRAMUSCULAR | Status: AC
  Administered 2016-06-21: 20 [IU] via SUBCUTANEOUS
  Filled 2016-06-21: qty 0.2

## 2016-06-21 NOTE — Patient Instructions (Addendum)
[facility] Discharge Instructions for Patients Receiving Chemotherapy  Today you received the following chemotherapy agents:  Imfinzi.  To help prevent nausea and vomiting after your treatment, we encourage you to take your nausea medication as directed.   If you develop nausea and vomiting that is not controlled by your nausea medication, call the clinic.   BELOW ARE SYMPTOMS THAT SHOULD BE REPORTED IMMEDIATELY:  *FEVER GREATER THAN 100.5 F  *CHILLS WITH OR WITHOUT FEVER  NAUSEA AND VOMITING THAT IS NOT CONTROLLED WITH YOUR NAUSEA MEDICATION  *UNUSUAL SHORTNESS OF BREATH  *UNUSUAL BRUISING OR BLEEDING  TENDERNESS IN MOUTH AND THROAT WITH OR WITHOUT PRESENCE OF ULCERS  *URINARY PROBLEMS  *BOWEL PROBLEMS  UNUSUAL RASH Items with * indicate a potential emergency and should be followed up as soon as possible.  Feel free to call the clinic you have any questions or concerns. The clinic phone number is [phone].  Please show the [facility] at check-in to the Emergency Department and triage nurse.   Hyperglycemia Hyperglycemia is when the sugar (glucose) level in your blood is too high. It may not cause symptoms. If you do have symptoms, they may include warning signs, such as:  Feeling more thirsty than normal.  Hunger.  Feeling tired.  Needing to pee (urinate) more than normal.  Blurry eyesight (vision). You may get other symptoms as it gets worse, such as:  Dry mouth.  Not being hungry (loss of appetite).  Fruity-smelling breath.  Weakness.  Weight gain or loss that is not planned. Weight loss may be fast.  A tingling or numb feeling in your hands or feet.  Headache.  Skin that does not bounce back quickly when it is lightly pinched and released (poor skin turgor).  Pain in your belly (abdomen).  Cuts or bruises that heal slowly. High blood sugar can happen to people who do or do not have diabetes. High blood sugar can happen  slowly or quickly, and it can be an emergency. Follow these instructions at home: General instructions   Take over-the-counter and prescription medicines only as told by your doctor.  Do not use products that contain nicotine or tobacco, such as cigarettes and e-cigarettes. If you need help quitting, ask your doctor.  Limit alcohol intake to no more than 1 drink per day for nonpregnant women and 2 drinks per day for men. One drink equals 12 oz of beer, 5 oz of wine, or 1 oz of hard liquor.  Manage stress. If you need help with this, ask your doctor.  Keep all follow-up visits as told by your doctor. This is important. Eating and drinking   Stay at a healthy weight.  Exercise regularly, as told by your doctor.  Drink enough fluid, especially when you:  Exercise.  Get sick.  Are in hot temperatures.  Eat healthy foods, such as:  Low-fat (lean) proteins.  Complex carbs (complex carbohydrates), such as whole wheat bread or brown rice.  Fresh fruits and vegetables.  Low-fat dairy products.  Healthy fats.  Drink enough fluid to keep your pee (urine) clear or pale yellow. If you have diabetes:   Make sure you know the symptoms of hyperglycemia.  Follow your diabetes management plan, as told by your doctor. Make sure you:  Take insulin and medicines as told.  Follow your exercise plan.  Follow your meal plan. Eat on time. Do not skip meals.  Check your blood sugar as often as told. Make sure to check  before and after exercise. If you exercise longer or in a different way than you normally do, check your blood sugar more often.  Follow your sick day plan whenever you cannot eat or drink normally. Make this plan ahead of time with your doctor.  Share your diabetes management plan with people in your workplace, school, and household.  Check your urine for ketones when you are ill and as told by your doctor.  Carry a card or wear jewelry that says that you have  diabetes. Contact a doctor if:  Your blood sugar level is higher than 240 mg/dL (13.3 mmol/L) for 2 days in a row.  You have problems keeping your blood sugar in your target range.  High blood sugar happens often for you. Get help right away if:  You have trouble breathing.  You have a change in how you think, feel, or act (mental status).  You feel sick to your stomach (nauseous), and that feeling does not go away.  You cannot stop throwing up (vomiting). These symptoms may be an emergency. Do not wait to see if the symptoms will go away. Get medical help right away. Call your local emergency services (911 in the [place] Do not drive yourself to the hospital. Summary  Hyperglycemia is when the sugar (glucose) level in your blood is too high.  High blood sugar can happen to people who do or do not have diabetes.  Make sure you drink enough fluids, eat healthy foods, and exercise regularly.  Contact your doctor if you have problems keeping your blood sugar in your target range. This information is not intended to replace advice given to you by your health care provider. Make sure you discuss any questions you have with your health care provider. Document Released: 01/17/2009 Document Revised: 12/08/2015 Document Reviewed: 12/08/2015 Elsevier Interactive Patient Education  2017 [employer].

## 2016-06-21 NOTE — Progress Notes (Signed)
Pt c/o of stinging at PICC site that started yesterday. Blood return noted and flushed without difficulty. Site cleaned and Dressing change. Pt states stinging is no longer present.

## 2016-06-21 NOTE — Progress Notes (Signed)
Patient presented to Infusion Room with no complaints. Noted elevated HR. Patient states this is per norm and Dr. Irene Limbo is aware. Reviewed historical data which proved same. Dr. Julien Nordmann aware of elevated HR and elevated serum glucose. OK to treat per Dr. Julien Nordmann. Orders received, repeated, and confirmed. Will recheck CBG prior to discharge. Dr. Julien Nordmann notified of CBG results. Ok to discharge. Patient aware of plan of care and agrees to plan. At time of discharge, patient educated on the importance of weekly PICC dressing changes. Patient verbalized understanding. States she will call Dr. Grier Mitts desk nurse to make appointment for next Monday (06/28/2016).

## 2016-06-21 NOTE — Patient Instructions (Signed)
PICC Home Guide °A peripherally inserted central catheter (PICC) is a long, thin, flexible tube that is inserted into a vein in the upper arm. It is a form of intravenous (IV) access. It is considered to be a "central" line because the tip of the PICC ends in a large vein in your chest. This large vein is called the superior vena cava (SVC). The PICC tip ends in the SVC because there is a lot of blood flow in the SVC. This allows medicines and IV fluids to be quickly distributed throughout the body. The PICC is inserted using a sterile technique by a specially trained nurse or physician. After the PICC is inserted, a chest X-ray exam is done to be sure it is in the correct place. °A PICC may be placed for different reasons, such as: °· To give medicines and liquid nutrition that can only be given through a central line. Examples are: °? Certain antibiotic treatments. °? Chemotherapy. °? Total parenteral nutrition (TPN). °· To take frequent blood samples. °· To give IV fluids and blood products. °· If there is difficulty placing a peripheral intravenous (PIV) catheter. ° °If taken care of properly, a PICC can remain in place for several months. A PICC can also allow a person to go home from the hospital early. Medicine and PICC care can be managed at home by a family member or home health care team. °What problems can happen when I have a PICC? °Problems with a PICC can occasionally occur. These may include the following: °· A blood clot (thrombus) forming in or at the tip of the PICC. This can cause the PICC to become clogged. A clot-dissolving medicine called tissue plasminogen activator (tPA) can be given through the PICC to help break up the clot. °· Inflammation of the vein (phlebitis) in which the PICC is placed. Signs of inflammation may include redness, pain at the insertion site, red streaks, or being able to feel a "cord" in the vein where the PICC is located. °· Infection in the PICC or at the insertion  site. Signs of infection may include fever, chills, redness, swelling, or pus drainage from the PICC insertion site. °· PICC movement (malposition). The PICC tip may move from its original position due to excessive physical activity, forceful coughing, sneezing, or vomiting. °· A break or cut in the PICC. It is important to not use scissors near the PICC. °· Nerve or tendon irritation or injury during PICC insertion. ° °What should I keep in mind about activities when I have a PICC? °· You may bend your arm and move it freely. If your PICC is near or at the bend of your elbow, avoid activity with repeated motion at the elbow. °· Rest at home for the remainder of the day following PICC line insertion. °· Avoid lifting heavy objects as instructed by your health care provider. °· Avoid using a crutch with the arm on the same side as your PICC. You may need to use a walker. °What should I know about my PICC dressing? °· Keep your PICC bandage (dressing) clean and dry to prevent infection. °? Ask your health care provider when you may shower. Ask your health care provider to teach you how to wrap the PICC when you do take a shower. °· Change the PICC dressing as instructed by your health care provider. °· Change your PICC dressing if it becomes loose or wet. °What should I know about PICC care? °· Check the PICC insertion   site daily for leakage, redness, swelling, or pain. °· Do not take a bath, swim, or use hot tubs when you have a PICC. Cover PICC line with clear plastic wrap and tape to keep it dry while showering. °· Flush the PICC as directed by your health care provider. Let your health care provider know right away if the PICC is difficult to flush or does not flush. Do not use force to flush the PICC. °· Do not use a syringe that is less than 10 mL to flush the PICC. °· Never pull or tug on the PICC. °· Avoid blood pressure checks on the arm with the PICC. °· Keep your PICC identification card with you at all  times. °· Do not take the PICC out yourself. Only a trained clinical professional should remove the PICC. °Get help right away if: °· Your PICC is accidentally pulled all the way out. If this happens, cover the insertion site with a bandage or gauze dressing. Do not throw the PICC away. Your health care provider will need to inspect it. °· Your PICC was tugged or pulled and has partially come out. Do not  push the PICC back in. °· There is any type of drainage, redness, or swelling where the PICC enters the skin. °· You cannot flush the PICC, it is difficult to flush, or the PICC leaks around the insertion site when it is flushed. °· You hear a "flushing" sound when the PICC is flushed. °· You have pain, discomfort, or numbness in your arm, shoulder, or jaw on the same side as the PICC. °· You feel your heart "racing" or skipping beats. °· You notice a hole or tear in the PICC. °· You develop chills or a fever. °This information is not intended to replace advice given to you by your health care provider. Make sure you discuss any questions you have with your health care provider. °Document Released: 09/26/2002 Document Revised: 10/10/2015 Document Reviewed: 01/12/2013 °Elsevier Interactive Patient Education © 2017 [employer] ° °

## 2016-06-22 ENCOUNTER — Telehealth: Payer: Self-pay | Admitting: Hematology

## 2016-06-22 NOTE — Telephone Encounter (Signed)
sw pt to confirm picc line flush appts for 3/21 and 3/23. Pt will call desk nurse to complain about pain in picc area

## 2016-06-23 NOTE — Progress Notes (Signed)
Late entry. Darden Dates RN aware that patient needs appointments for PICC dressing changes, pt presented to office 06/21/16 with dressing that was past due to be changed(due to be changed 06/07/16). Pt aware that dressing is to be changed weeklyand the importance of the dressing changes, pt verbalizes understanding. Pt educated and instructed to monitor for s/s of infection. Pt verbalizes understanding.

## 2016-06-25 ENCOUNTER — Ambulatory Visit (HOSPITAL_BASED_OUTPATIENT_CLINIC_OR_DEPARTMENT_OTHER): Payer: Medicaid Other

## 2016-06-25 DIAGNOSIS — Z452 Encounter for adjustment and management of vascular access device: Secondary | ICD-10-CM | POA: Diagnosis present

## 2016-06-25 DIAGNOSIS — C770 Secondary and unspecified malignant neoplasm of lymph nodes of head, face and neck: Secondary | ICD-10-CM | POA: Diagnosis not present

## 2016-06-25 DIAGNOSIS — C3491 Malignant neoplasm of unspecified part of right bronchus or lung: Secondary | ICD-10-CM

## 2016-06-25 DIAGNOSIS — Z95828 Presence of other vascular implants and grafts: Secondary | ICD-10-CM

## 2016-06-25 DIAGNOSIS — M6281 Muscle weakness (generalized): Secondary | ICD-10-CM | POA: Diagnosis not present

## 2016-06-25 MED ORDER — SODIUM CHLORIDE 0.9 % IJ SOLN
10.0000 mL | INTRAMUSCULAR | Status: DC | PRN
  Administered 2016-06-25: 10 mL via INTRAVENOUS
  Filled 2016-06-25: qty 10

## 2016-06-25 MED ORDER — HEPARIN SOD (PORK) LOCK FLUSH 100 UNIT/ML IV SOLN
500.0000 [IU] | Freq: Once | INTRAVENOUS | Status: AC | PRN
  Administered 2016-06-25: 500 [IU] via INTRAVENOUS
  Filled 2016-06-25: qty 5

## 2016-06-27 NOTE — Progress Notes (Signed)
[name]    HEMATOLOGY/ONCOLOGY CLINIC NOTE  Date of Service: .06/02/2016  Patient Care Team: Nolene Ebbs, MD as PCP - General (Internal Medicine)  CHIEF COMPLAINTS/PURPOSE OF CONSULTATION:  f/u Lung Adenocarcinoma.  Diagnosis Stage IIIB Lung Adenocarcinoma  Current treatment: -Starting Maintain Durvalumab from today 05/24/2016  PreviousTreatment -Concurrent Chemo-radiation with Carboplatin/Taxol. Completed RT on 02/13/2016 and has then completed 2 cycles of carboplatin + taxol.  HISTORY OF PRESENTING ILLNESS: Plz see my previous note for details on initial presentation.  INTERVAL HISTORY  Patient is here for her scheduled followup for toxicity check after having started maintenance Durvalumab for her stage IIIB lung adenocarcinoma. Her diabetes is still not optimally controlled. Her mother mentions that she has been laying in bed most of the day and has not felt very motivated to stay active. Patient notes significant fatigue that could be treatment related. She has been given an outpatient referral for cardiac rehabilitation could try to rebuild her endurance and muscle strength. No rashes. No other acute prohibitive toxicities.    MEDICAL HISTORY:  Past Medical History:  Diagnosis Date  . Asthma   . Diabetes mellitus   . Hypercholesteremia   . Hypertension   . Lung cancer ([place])   . Neuropathy ([place])   . Obese     SURGICAL HISTORY: Past Surgical History:  Procedure Laterality Date  . CESAREAN SECTION    . IR GENERIC HISTORICAL  01/08/2016   IR US GUIDE VASC ACCESS RIGHT 01/08/2016 [name], DO [facility]  . IR GENERIC HISTORICAL  01/08/2016   IR FLUORO GUIDE CV LINE RIGHT 01/08/2016 [name], DO [facility]  . TUBAL LIGATION    . US guided core needle biopsy     of right lower neck/supraclavicular lymph nodes    SOCIAL HISTORY: Social History   Social History  . Marital status: Divorced    Spouse name: N/A  . Number of children: N/A  . Years of  education: N/A   Occupational History  . Not on file.   Social History Main Topics  . Smoking status: Never Smoker  . Smokeless tobacco: Never Used  . Alcohol use No  . Drug use: No  . Sexual activity: Not Currently   Other Topics Concern  . Not on file   Social History Narrative   No bird or mold exposure. No recent travel.    FAMILY HISTORY: Family History  Problem Relation Age of Onset  . Cancer Neg Hx   . Rheumatologic disease Neg Hx     ALLERGIES:  has No Known Allergies.  MEDICATIONS:  Current Outpatient Prescriptions  Medication Sig Dispense Refill  . aspirin EC 81 MG tablet Take 1 tablet (81 mg total) by mouth daily. 60 tablet 2  . azithromycin (ZITHROMAX Z-PAK) 250 MG tablet Take 1 tablet (250 mg total) by mouth daily. 5110m PO day 1, then 2530mPO days 205 (Patient not taking: Reported on 06/10/2016) 6 tablet 0  . chlorhexidine (PERIDEX) 0.12 % solution Use as directed 15 mLs in the mouth or throat 2 (two) times daily. (Patient not taking: Reported on 06/10/2016) 120 mL 0  . diltiazem (CARDIZEM CD) 120 MG 24 hr capsule Take 1 capsule (120 mg total) by mouth daily. Start taking this instead of your (Metoprolol/Toprol XL). 30 capsule 0  . DULoxetine (CYMBALTA) 30 MG capsule Take 1 capsule (30 mg total) by mouth daily. 30 capsule 1  . fluconazole (DIFLUCAN) 100 MG tablet Take 1 tablet (100 mg total) by mouth daily. [place]  tablet 1  . Fluocinonide 0.1 % CREA Apply 1 application topically daily.     [name] HYDROcodone-acetaminophen (HYCET) 7.5-325 mg/15 ml solution Take 15 mLs by mouth 4 (four) times daily as needed for moderate pain. 473 mL 0  . insulin aspart (NOVOLOG) 100 UNIT/ML FlexPen As per sliding scale instructions (Patient taking differently: Inject 0-20 Units into the skin 3 (three) times daily as needed for high blood sugar. As per sliding scale instructions) 15 mL 2  . Insulin Degludec (TRESIBA FLEXTOUCH) 200 UNIT/ML SOPN Inject 100 Units into the skin daily before  breakfast.     . lidocaine-prilocaine (EMLA) cream Apply to affected area once (Patient not taking: Reported on 06/10/2016) 30 g 3  . loratadine (CLARITIN) 10 MG tablet Take 10 mg by mouth daily.    . magic mouthwash w/lidocaine SOLN Take 5 mLs by mouth 4 (four) times daily as needed for mouth pain. (Patient not taking: Reported on 06/10/2016) 400 mL 1  . nystatin (MYCOSTATIN) 100000 UNIT/ML suspension Take 5 mLs (500,000 Units total) by mouth 4 (four) times daily. (Patient not taking: Reported on 06/10/2016) 200 mL 0  . polyethylene glycol (MIRALAX) packet Take 17 g by mouth daily. 30 each 1  . predniSONE (DELTASONE) 20 MG tablet 3 tabs po day one, then 2 tabs daily x 4 days (Patient not taking: Reported on 06/10/2016) 11 tablet 0  . promethazine (PHENERGAN) 25 MG tablet Take 1 tablet (25 mg total) by mouth every 6 (six) hours as needed for nausea or vomiting. (Patient not taking: Reported on 06/10/2016) 12 tablet 0  . senna-docusate (SENNA S) 8.6-50 MG tablet Take 2 tablets by mouth 2 (two) times daily. May reduce to 2 tab po HS as needed once regular BM estabished 60 tablet 1  . sucralfate (CARAFATE) 1 g tablet Take 1 tablet (1 g total) by mouth 4 (four) times daily -  with meals and at bedtime. 5 min before meals for radiation induced esophagitis (Patient not taking: Reported on 06/10/2016) 120 tablet 2  . Wound Dressings (SONAFINE) Apply 1 application topically 2 (two) times daily.    [name] dronabinol (MARINOL) 2.5 MG capsule Take 1 capsule (2.5 mg total) by mouth 2 (two) times daily before a meal. 60 capsule 0  . traZODone (DESYREL) 50 MG tablet Take 1-2 tablets (50-100 mg total) by mouth at bedtime as needed for sleep. 60 tablet 0   No current facility-administered medications for this visit.    Facility-Administered Medications Ordered in Other Visits  Medication Dose Route Frequency Provider Last Rate Last Dose  . sodium chloride 0.9 % injection 10 mL  10 mL Intravenous PRN [name], MD   10 mL  at 05/24/16 1252    REVIEW OF SYSTEMS:    10 Point review of Systems was done is negative except as noted above.  PHYSICAL EXAMINATION: ECOG PERFORMANCE STATUS: 1 - Symptomatic but completely ambulatory  . Vitals:   06/02/16 1258  BP: 133/89  Pulse: (!) 111  Resp: 19  Temp: 99.1 F (37.3 C)   Filed Weights   06/02/16 1258  Weight: 240 lb 6.4 oz (109 kg)   .Body mass index is 35.5 kg/m.  GENERAL:alert, in no acute distress and comfortable SKIN: radiation related skin injury over neck - healing EYES: normal, conjunctiva are pink and non-injected, sclera clear OROPHARYNX:oral thrush noted NECK: supple, no JVD, thyroid normal size, non-tender, without nodularity LYMPH:  no palpable lymphadenopathy in the cervical, axillary or inguinal.  LUNGS: clear to auscultation with normal respiratory effort. Distant breath sounds  HEART: regular rate & rhythm,  no murmurs and no lower extremity edema ABDOMEN: abdomen obese, soft, non-tender, normoactive bowel sounds  Musculoskeletal: no cyanosis of digits and no clubbing  PSYCH: alert & oriented x 3 with fluent speech NEURO: no focal motor/sensory deficits  LABORATORY DATA: .  [name] CBC Latest Ref Rng & Units 06/02/2016 05/24/2016  WBC 3.9 - 10.3 10e3/uL 6.1 5.9  Hemoglobin 11.6 - 15.9 g/dL 9.8(L) 9.6(L)  Hematocrit 34.8 - 46.6 % 29.4(L) 28.8(L)  Platelets 145 - 400 10e3/uL 361 315   . CMP Latest Ref Rng & Units 06/02/2016 05/24/2016  Glucose 70 - 140 mg/dl 266(H) 337(H)  BUN 7.0 - 26.0 mg/dL 6.1(L) 6.5(L)  Creatinine 0.6 - 1.1 mg/dL 0.7 0.7  Sodium 136 - 145 mEq/L 138 138  Potassium 3.5 - 5.1 mEq/L 3.4(L) 4.1  Chloride 101 - 111 mmol/L - -  CO2 22 - 29 mEq/L 27 25  Calcium 8.4 - 10.4 mg/dL 9.5 9.4  Total Protein 6.4 - 8.3 g/dL 7.5 7.1  Total Bilirubin 0.20 - 1.20 mg/dL 0.43 0.40  Alkaline Phos 40 - 150 U/L 91 89  AST 5 - 34 U/L 10 16  ALT 0 - 55 U/L <6 11              RADIOGRAPHIC STUDIES: I have  personally reviewed the radiological images as listed and agreed with the findings in the report. PET/CT IMPRESSION: 1. Hypermetabolic 1.7 cm peripheral right hilar nodule. Given the peripheral location of this nodule within the right hilum, this could potentially represent a centrally located primary bronchogenic malignancy. 2. Hypermetabolic bulky ipsilateral paratracheal, bilateral prevascular mediastinal and bilateral scalene nodal metastases. 3. Hypermetabolic right lower neck nodal metastases involving right neck nodal levels III, IV and V. 4. No hypermetabolic metastatic disease in the abdomen, pelvis or skeleton. 5. Aortic atherosclerosis.   Electronically Signed   By: Ilona Sorrel M.D.   On: 01/12/2016 09:42    CTA chest 04/16/2016  IMPRESSION: 1. No evidence of a pulmonary embolism. 2. New, 16 mm, focal area of ground-glass opacity in the right upper lobe. This may reflect metastatic disease. It may be inflammatory/infectious in origin. 3. There is a less well-defined but otherwise similar appearing area of opacity in the superior segment right lower lobe, which has increased from the prior chest CT. This also may reflect metastatic disease or be infectious or inflammatory in etiology. 4. There has been interval improvement in the lung carcinoma. The bulky right paratracheal mass has decreased in size. The supraclavicular adenopathy has significantly decreased with no residual enlarged lymph nodes.   Electronically Signed   By: Lajean Manes M.D.   On: 04/16/2016 12:02  CTA chest 05/14/2016  IMPRESSION: 1. No filling defect is identified in the pulmonary arterial tree to suggest pulmonary embolus. 2. Primarily dependent and sharply defined hazy airspace opacities in the right upper lobe and right lower lobe, with some minimal involvement of the left upper lobe and posterior portion of the right middle lobe. At the margins these mostly conform to  secondary pulmonary lobules. Appearance suspicious for multilobar pneumonia, aspiration pneumonia might have a similar appearance. Endobronchial spread of tumor might conceivably have a similar appearance; the right paratracheal mass is not appreciably changed in size or morphology and there is no obvious tumor extension into the adjacent trachea or right mainstem bronchus, and no airway occlusion is observed.   Electronically Signed  By: Van Clines M.D.   On: 05/14/2016 14:47   ASSESSMENT & PLAN:   51 year old African-American female with history of hypertension, diabetes, asthma, dysuria mass and morbid obesity with   #1 Lung Adenocarcinoma Rt sided atleast Stage IIIB with large right paratracheal mass with mediastinal adenopathy that appears to have grown significantly over the last 6-7 months and rt supraclavicular LN +.   Noted to have a small mass in the left adrenal on CT but PET/CT neg for metastatic disease. Patient has been a lifelong nonsmoker  MRI of the brain was negative for any metastatic disease.  High PDL1 expression (90%) on [facility] Neg for EGFR, ALK, ROS-1 and BRAF mutations.  Patient has completed her planned definitive chemoradiation with carbo Taxol on 02/13/2016. No prohibitive toxicities other than some grade 1 skin desquamation and some grade 1-2 radiation esophagitis. She has subsequently received 2 out of 2 planned dose of carboplatin + Taxol.  #2 resolving pneumonia/radiation pneumonitis . #3 resolving fatigue from chemotherapy  #4 normocytic anemia related to chemotherapy  #5 Left chest wall pain due to costochondritis - mx with pain medications. CTA x 2 neg for PE Plan -labs stable. -started on maintenance Durvalumab- no prohibitive toxicities of this exam grade 2 fatigue. -counseled regarding importance of daily exercise/activity -Given a referral for outpatient cancer rehabilitation for muscle strengthening and endurance  training. -Part of her fatigue is also related to her uncontrolled diabetes. She was counseled on need for optimization of her diabetes control with her primary care physician. Might need endocrinology referral --recommended maintenance of good po hydration and food intake -shall continue Durvalumab maintenance. -will plan to get rpt CT chest in May 2018  #6 Sinus tachycardia - on toprol XL , no PE on CTA x 2 Plan - switched from Toprol XL to Cardizem CR given h/o asthma with some scattered wheezing. Maintain good hydration.   #3 neoplasm related pain is currently controlled without significant medications. Clinically likely has sleep apnea . Would need to be careful with sedative medications . Plan -consider sleep study with PCP  #3 hypertension   #4 diabetes -uncontrolled #5 dyslipidemia #6 asthma Plan - insulin SS for uncontrolled hyperglycemia in the setting of steroid use with chemotherapy. 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 The patient knows to call the clinic with any problems, questions or concerns.  I spent 25 minutes counseling the patient face to face. The total time spent in the appointment was 30 minutes and more than 50% was on counseling and direct patient cares.    GSullivan LoneMD MOchelataAAHIVMS SThe Ruby Valley HospitalCSoutheast Georgia Health System[facility] Brunswick CampusHematology/Oncology Physician CKaiser Permanente Panorama City (Office):       [phone](Work cell):  [phone](Fax):           [phone]

## 2016-06-28 ENCOUNTER — Telehealth: Payer: Self-pay | Admitting: *Deleted

## 2016-06-28 ENCOUNTER — Other Ambulatory Visit: Payer: Self-pay | Admitting: *Deleted

## 2016-06-28 MED ORDER — DILTIAZEM HCL ER COATED BEADS 120 MG PO CP24: 120.0000 mg | ORAL_CAPSULE | Freq: Every day | ORAL | 0 refills | Status: DC

## 2016-06-28 NOTE — Telephone Encounter (Signed)
-----   Message from [name], MD sent at 06/27/2016 11:46 PM EDT ----- Regarding: followup Tomiko Schoon/Lashonya, Plz patient patient and see how she is doing. Could you confirm that she has followed with cancer rehab. Also can you confirm that she is taking her insulin appropriately and has followed with her PCP to optimize her DM2 control. Her blood sugars are in the 400's and need to be controlled well. Thanks [place]

## 2016-06-28 NOTE — Telephone Encounter (Signed)
Per MD staff msg, called pt to follow up on current status.  Pt states she is feeling much better, and is doing well after treatment.  Pt states she will follow up with rehab tomorrow.  Pt also stated she is "doing better with blood sugars, they are coming down."  Pt asked for refill on diltiazem.  Rx e-scribed to pt preferred pharmacy.  Pt verbalized understanding/thankful for call.

## 2016-07-03 ENCOUNTER — Encounter (HOSPITAL_COMMUNITY): Payer: Self-pay

## 2016-07-03 ENCOUNTER — Other Ambulatory Visit: Payer: Self-pay

## 2016-07-03 ENCOUNTER — Emergency Department (HOSPITAL_COMMUNITY)
Admission: EM | Admit: 2016-07-03 | Discharge: 2016-07-03 | Disposition: A | Discharge status: Home / Self Care | Payer: Medicaid Other | Attending: Emergency Medicine | Admitting: Emergency Medicine

## 2016-07-03 ENCOUNTER — Emergency Department (HOSPITAL_COMMUNITY): Payer: Medicaid Other

## 2016-07-03 DIAGNOSIS — Z85118 Personal history of other malignant neoplasm of bronchus and lung: Secondary | ICD-10-CM | POA: Insufficient documentation

## 2016-07-03 DIAGNOSIS — E1140 Type 2 diabetes mellitus with diabetic neuropathy, unspecified: Secondary | ICD-10-CM | POA: Insufficient documentation

## 2016-07-03 DIAGNOSIS — Z7982 Long term (current) use of aspirin: Secondary | ICD-10-CM | POA: Diagnosis not present

## 2016-07-03 DIAGNOSIS — I10 Essential (primary) hypertension: Secondary | ICD-10-CM | POA: Insufficient documentation

## 2016-07-03 DIAGNOSIS — R079 Chest pain, unspecified: Secondary | ICD-10-CM | POA: Diagnosis not present

## 2016-07-03 DIAGNOSIS — Z794 Long term (current) use of insulin: Secondary | ICD-10-CM | POA: Diagnosis not present

## 2016-07-03 DIAGNOSIS — J45909 Unspecified asthma, uncomplicated: Secondary | ICD-10-CM | POA: Insufficient documentation

## 2016-07-03 LAB — CBC WITH DIFFERENTIAL/PLATELET
Basophils Absolute: 0 10*3/uL (ref 0.0–0.1)
Basophils Relative: 0 %
Eosinophils Absolute: 0.1 10*3/uL (ref 0.0–0.7)
Eosinophils Relative: 2 %
HCT: 30.3 % — ABNORMAL LOW (ref 36.0–46.0)
Hemoglobin: 10 g/dL — ABNORMAL LOW (ref 12.0–15.0)
Lymphocytes Relative: 18 %
Lymphs Abs: 1.3 10*3/uL (ref 0.7–4.0)
MCH: 27.6 pg (ref 26.0–34.0)
MCHC: 33 g/dL (ref 30.0–36.0)
MCV: 83.7 fL (ref 78.0–100.0)
Monocytes Absolute: 0.5 10*3/uL (ref 0.1–1.0)
Monocytes Relative: 6 %
Neutro Abs: 5.3 10*3/uL (ref 1.7–7.7)
Neutrophils Relative %: 74 %
Platelets: 306 10*3/uL (ref 150–400)
RBC: 3.62 MIL/uL — ABNORMAL LOW (ref 3.87–5.11)
RDW: 13.8 % (ref 11.5–15.5)
WBC: 7.2 10*3/uL (ref 4.0–10.5)

## 2016-07-03 LAB — BASIC METABOLIC PANEL
Anion gap: 8 (ref 5–15)
BUN: 9 mg/dL (ref 6–20)
CO2: 27 mmol/L (ref 22–32)
Calcium: 8.9 mg/dL (ref 8.9–10.3)
Chloride: 102 mmol/L (ref 101–111)
Creatinine, Ser: 0.54 mg/dL (ref 0.44–1.00)
GFR calc Af Amer: >60 mL/min (ref >60)
GFR calc non Af Amer: >60 mL/min (ref >60)
Glucose, Bld: 330 mg/dL — ABNORMAL HIGH (ref 65–99)
Potassium: 3.8 mmol/L (ref 3.5–5.1)
Sodium: 137 mmol/L (ref 135–145)

## 2016-07-03 LAB — TROPONIN I: Troponin I: <0.03 ng/mL (ref <0.03)

## 2016-07-03 MED ORDER — OXYCODONE-ACETAMINOPHEN 5-325 MG PO TABS: 1.0000 | ORAL_TABLET | ORAL | 0 refills | Status: DC | PRN

## 2016-07-03 MED ORDER — HYDROMORPHONE HCL 1 MG/ML IJ SOLN
1.0000 mg | Freq: Once | INTRAMUSCULAR | Status: AC
  Administered 2016-07-03: 1 mg via INTRAVENOUS
  Filled 2016-07-03: qty 1

## 2016-07-03 MED ORDER — KETOROLAC TROMETHAMINE 15 MG/ML IJ SOLN
15.0000 mg | Freq: Once | INTRAMUSCULAR | Status: AC
  Administered 2016-07-03: 15 mg via INTRAVENOUS
  Filled 2016-07-03: qty 1

## 2016-07-03 MED ORDER — LEVOFLOXACIN 750 MG PO TABS
750.0000 mg | ORAL_TABLET | Freq: Once | ORAL | Status: AC
  Administered 2016-07-03: 750 mg via ORAL
  Filled 2016-07-03: qty 1

## 2016-07-03 MED ORDER — LEVOFLOXACIN 750 MG PO TABS: 750.0000 mg | ORAL_TABLET | Freq: Every day | ORAL | 0 refills | Status: DC

## 2016-07-03 MED ORDER — SODIUM CHLORIDE 0.9 % IV SOLN
INTRAVENOUS | Status: DC
  Administered 2016-07-03: 100 mL/h via INTRAVENOUS

## 2016-07-03 NOTE — ED Triage Notes (Signed)
Patient c/o constant left chest pain x 2 days. Patient also c/o SOB x 2 days.

## 2016-07-03 NOTE — ED Notes (Signed)
Patient transported to X-ray 

## 2016-07-03 NOTE — ED Provider Notes (Signed)
[facility] [place] [facility] DEPT Provider Note   CSN: [id number] Arrival date & time: 07/03/16  1636  By signing my name below, I, Reola Mosher, attest that this documentation has been prepared under the direction and in the presence of Virgel Manifold, MD. Electronically Signed: Reola Mosher, ED Scribe. 07/03/16. 4:59 PM.  History   Chief Complaint Chief Complaint  Patient presents with  . Chest Pain  . Shortness of Breath   The history is provided by the patient and medical records. No language interpreter was used.    HPI Comments: Marilyn [name] is a 51 y.o. female with a PMHx of stage III lung cancer, DM, HTN, hypercholesteremia, and obesity, who presents to the Emergency Department complaining of constant, waxing and waning left-sided chest pain beginning two days ago. Per pt, she was at rest two days ago when her she had a new onset of throbbing left-sided chest pain. Her pain initially eased off last night but acutely worsened again this afternoon. She notes radiation of her pain into her left-sided neck and left shoulder. Her pain is non-exertional and is non-pleuritic. Pt reports associated shortness of breath secondary to her pain. She reports that she has a h/o chest pain in the past, however, she states that her pain today is non-similar to this. Pt has been taking Tylenol at home without relief of her pain. No Hx of PE/DVT or previous cardiac events. Pt is currently tolerating Durvalumab infusions which she began on 02/19. Last radiation therapy was 02/13/16. She denies cough, fever, leg swelling, rash, or any other associated symptoms.   Past Medical History:  Diagnosis Date  . Asthma   . Diabetes mellitus   . Hypercholesteremia   . Hypertension   . Lung cancer ([place])   . Neuropathy ([place])   . Obese    Patient Active Problem List   Diagnosis Date Noted  . Community acquired pneumonia   . Sinus tachycardia   . Thrush of mouth and esophagus ([place]) 04/07/2016  .  Constipation 04/07/2016  . Palliative care by specialist   . Port catheter in place 01/19/2016  . Dehydration 01/14/2016  . Tachycardia 01/14/2016  . Adenocarcinoma of left lung, stage 3 ([place]) 01/01/2016  . Non-small cell lung cancer ([place]) 12/31/2015  . Mediastinal mass   . Chest wall pain, chronic 12/26/2015  . Headache 12/26/2015  . Diabetes mellitus ([place]) 12/26/2015   Past Surgical History:  Procedure Laterality Date  . CESAREAN SECTION    . IR GENERIC HISTORICAL  01/08/2016   IR US GUIDE VASC ACCESS RIGHT 01/08/2016 [name], DO [facility]  . IR GENERIC HISTORICAL  01/08/2016   IR FLUORO GUIDE CV LINE RIGHT 01/08/2016 [name], DO [facility]  . TUBAL LIGATION    . US guided core needle biopsy     of right lower neck/supraclavicular lymph nodes   OB History    No data available     Home Medications    Prior to Admission medications   Medication Sig Start Date End Date Taking? Authorizing Provider  aspirin EC 81 MG tablet Take 1 tablet (81 mg total) by mouth daily. 05/24/16   [name], MD  azithromycin (ZITHROMAX Z-PAK) 250 MG tablet Take 1 tablet (250 mg total) by mouth daily. '500mg'$  PO day 1, then '250mg'$  PO days 205 Patient not taking: Reported on 06/10/2016 05/14/16   Elmyra Ricks Pisciotta, PA-C  chlorhexidine (PERIDEX) 0.12 % solution Use as directed 15 mLs in the mouth or throat 2 (two)  times daily. Patient not taking: Reported on 06/10/2016 01/05/16   [name], MD  diltiazem (CARDIZEM CD) 120 MG 24 hr capsule Take 1 capsule (120 mg total) by mouth daily. Start taking this instead of your (Metoprolol/Toprol XL). 06/28/16   [name], MD  dronabinol (MARINOL) 2.5 MG capsule Take 1 capsule (2.5 mg total) by mouth 2 (two) times daily before a meal. 06/02/16   [name], MD  DULoxetine (CYMBALTA) 30 MG capsule Take 1 capsule (30 mg total) by mouth daily. 05/24/16   [name], MD  fluconazole (DIFLUCAN) 100 MG tablet Take 1 tablet  (100 mg total) by mouth daily. 04/07/16   Susanne Borders, NP  Fluocinonide 0.1 % CREA Apply 1 application topically daily.  01/06/16   Historical Provider, MD  HYDROcodone-acetaminophen (HYCET) 7.5-325 mg/15 ml solution Take 15 mLs by mouth 4 (four) times daily as needed for moderate pain. 02/06/16 02/05/17  [name], MD  insulin aspart (NOVOLOG) 100 UNIT/ML FlexPen As per sliding scale instructions Patient taking differently: Inject 0-20 Units into the skin 3 (three) times daily as needed for high blood sugar. As per sliding scale instructions 01/16/16   [name], MD  Insulin Degludec (TRESIBA FLEXTOUCH) 200 UNIT/ML SOPN Inject 100 Units into the skin daily before breakfast.     Historical Provider, MD  lidocaine-prilocaine (EMLA) cream Apply to affected area once Patient not taking: Reported on 06/10/2016 05/11/16   [name], MD  loratadine (CLARITIN) 10 MG tablet Take 10 mg by mouth daily.    Historical Provider, MD  magic mouthwash w/lidocaine SOLN Take 5 mLs by mouth 4 (four) times daily as needed for mouth pain. Patient not taking: Reported on 06/10/2016 01/23/16   [name], MD  nystatin (MYCOSTATIN) 100000 UNIT/ML suspension Take 5 mLs (500,000 Units total) by mouth 4 (four) times daily. Patient not taking: Reported on 06/10/2016 03/18/16   [name], MD  polyethylene glycol [facility]) packet Take 17 g by mouth daily. 05/24/16   [name], MD  predniSONE (DELTASONE) 20 MG tablet 3 tabs po day one, then 2 tabs daily x 4 days Patient not taking: Reported on 06/10/2016 05/14/16   Elmyra Ricks Pisciotta, PA-C  promethazine (PHENERGAN) 25 MG tablet Take 1 tablet (25 mg total) by mouth every 6 (six) hours as needed for nausea or vomiting. Patient not taking: Reported on 06/10/2016 03/17/16   Elmyra Ricks Pisciotta, PA-C  senna-docusate (SENNA S) 8.6-50 MG tablet Take 2 tablets by mouth 2 (two) times daily. May reduce to 2 tab po HS as needed once regular BM  estabished 05/24/16   [name], MD  sucralfate (CARAFATE) 1 g tablet Take 1 tablet (1 g total) by mouth 4 (four) times daily -  with meals and at bedtime. 5 min before meals for radiation induced esophagitis Patient not taking: Reported on 06/10/2016 01/22/16   Tyler Pita, MD  traZODone (DESYREL) 50 MG tablet Take 1-2 tablets (50-100 mg total) by mouth at bedtime as needed for sleep. 06/02/16   [name], MD  Wound Dressings (SONAFINE) Apply 1 application topically 2 (two) times daily.    Historical Provider, MD   Family History Family History  Problem Relation Age of Onset  . Cancer Neg Hx   . Rheumatologic disease Neg Hx    Social History Social History  Substance Use Topics  . Smoking status: Never Smoker  . Smokeless tobacco: Never Used  . Alcohol use No  Allergies   Patient has no known allergies.  Review of Systems Review of Systems  Constitutional: Negative for fever.  Respiratory: Positive for shortness of breath. Negative for cough.   Cardiovascular: Positive for chest pain. Negative for leg swelling.  Skin: Negative for rash.  All other systems reviewed and are negative.  Physical Exam Updated Vital Signs BP 127/82 (BP Location: Left Arm)   Pulse (!) 139   Temp 97.5 F (36.4 C) (Oral)   Resp (!) 27   SpO2 99%   Physical Exam  Constitutional: She appears well-developed and well-nourished.  Morbidly obese.   HENT:  Head: Normocephalic.  Right Ear: External ear normal.  Left Ear: External ear normal.  Nose: Nose normal.  Mouth/Throat: Oropharynx is clear and moist.  Eyes: Conjunctivae are normal. Right eye exhibits no discharge. Left eye exhibits no discharge.  Neck: Normal range of motion.  Cardiovascular: Regular rhythm and normal heart sounds.  Tachycardia present.   No murmur heard. Pt is tachycardic which is chronic, per records.   Pulmonary/Chest: Effort normal and breath sounds normal. No respiratory distress. She has no  wheezes. She has no rales.  Abdominal: Soft. She exhibits no distension. There is no tenderness. There is no rebound and no guarding.  Musculoskeletal: Normal range of motion. She exhibits no edema or tenderness.  Neurological: She is alert. No cranial nerve deficit. Coordination normal.  Skin: Skin is warm and dry. No rash noted. No erythema. No pallor.  Psychiatric: She has a normal mood and affect. Her behavior is normal.  Nursing note and vitals reviewed.  ED Treatments / Results  DIAGNOSTIC STUDIES: Oxygen Saturation is 99% on RA, normal by my interpretation.   COORDINATION OF CARE: 4:59 PM-Discussed next steps with pt. Pt verbalized understanding and is agreeable with the plan.   Labs (all labs ordered are listed, but only abnormal results are displayed) Labs Reviewed  CBC WITH DIFFERENTIAL/PLATELET - Abnormal; Notable for the following:       Result Value   RBC 3.62 (*)    Hemoglobin 10.0 (*)    HCT 30.3 (*)    All other components within normal limits  BASIC METABOLIC PANEL - Abnormal; Notable for the following:    Glucose, Bld 330 (*)    All other components within normal limits  TROPONIN I    EKG  EKG Interpretation  Date/Time:  Saturday July 03 2016 18:30:33 EDT Ventricular Rate:  122 PR Interval:    QRS Duration: 82 QT Interval:  318 QTC Calculation: 453 R Axis:   60 Text Interpretation:  Sinus tachycardia Posterior infarct, old Confirmed by Wilson Singer  MD, Dionicio Shelnutt [phone]) on 07/03/2016 8:15:38 PM      Radiology No results found.   Dg Chest 2 View  Result Date: 07/03/2016 CLINICAL DATA:  PT C/O ANTERIOR CP AND SOB X 2 DAYS HX BREAST CA W/CHEMO EXAM: CHEST  2 VIEW COMPARISON:  05/14/2016 FINDINGS: There is new opacity in the right lower lung consistent with right middle lobe atelectasis. Remainder of the lungs is clear. There is right peritracheal mediastinal thickening and masslike soft tissue fullness extending superior to the right hilum, which appears  increased when compared to the prior chest radiograph. Cardiac silhouette is normal in size. The left hilar contours are normal. No pleural effusion.  No pneumothorax. Right PICC is well positioned, tip at the caval atrial junction. IMPRESSION: 1. New right lower lung opacity consistent with right middle lobe atelectasis. A component of postobstructive consolidation or pneumonia is  possible. Remainder of the lungs is clear. 2. Right peritracheal and superior hilar mass increased when compared to the prior chest radiograph. Consider further assessment of these findings with chest CT with contrast. Electronically Signed   By: Lajean Manes M.D.   On: 07/03/2016 17:56   Ct Angio Chest Pe W Or Wo Contrast  Result Date: 07/05/2016 CLINICAL DATA:  Left-sided chest pain with shortness of breath and cough since 07/01/2016. Non-small cell lung cancer diagnosed in September 2017. EXAM: CT ANGIOGRAPHY CHEST WITH CONTRAST TECHNIQUE: Multidetector CT imaging of the chest was performed using the standard protocol during bolus administration of intravenous contrast. Multiplanar CT image reconstructions and MIPs were obtained to evaluate the vascular anatomy. CONTRAST:  66 cc Isovue 370 COMPARISON:  Chest x-ray dated 07/03/2016 and CT scan dated 05/14/2016 FINDINGS: Cardiovascular: Satisfactory opacification of the pulmonary arteries to the segmental level. No evidence of pulmonary embolism. Normal heart size. No pericardial effusion. Mediastinum/Nodes: There is precarinal adenopathy with the nodal mass measuring 3.8 cm, slightly increased from 3.6 cm on the prior study. Lungs/Pleura: There is a new moderate right pleural effusion with increased consolidation in the right upper and lower lobes, which I suspect is primarily due to radiation effect. Upper Abdomen: No acute abnormality. Musculoskeletal: No chest wall abnormality. No acute or significant osseous findings. Review of the MIP images confirms the above findings.  IMPRESSION: 1. No pulmonary emboli. 2. New moderate right pleural effusion. 3. Increase consolidation in the right upper and lower lobes, probably radiation effect. 4. Slight increase in precarinal adenopathy. Electronically Signed   By: Lorriane Shire M.D.   On: 07/05/2016 15:30    Procedures Procedures   Medications Ordered in ED Medications - No data to display  Initial Impression / Assessment and Plan / ED Course  I have reviewed the triage vital signs and the nursing notes.  Pertinent labs & imaging results that were available during my care of the patient were reviewed by me and considered in my medical decision making (see chart for details).     50yF with CP. Pt with chronic tachycardia and documented below. I doubt ACS. PE concern but difficulty in obtaining CTa. Discussed with pt. She is supposed to be on warfarin. INR subtherapeutic. Dose lovenox in ED. Restart coumadin.   Vitals - 1 value per visit 07/03/2016 06/21/2016 06/02/2016 05/24/2016 05/14/2016  Pulse 139 132 111 121 128   Vitals - 1 value per visit 04/26/2016 04/16/2016 04/07/2016 04/03/2016  Pulse 125 106 134 109   Vitals - 1 value per visit 04/01/2016 03/30/2016 03/18/2016 03/17/2016  Pulse 101 120 131 114   Vitals - 1 value per visit 03/15/2016 03/12/2016 03/03/2016 02/13/2016  Pulse 100 147 109 144   Vitals - 1 value per visit 02/12/2016  Pulse 109    Final Clinical Impressions(s) / ED Diagnoses   Final diagnoses:  Chest pain, unspecified type    New Prescriptions Discharge Medication List as of 07/03/2016  8:27 PM    START taking these medications   Details  levofloxacin (LEVAQUIN) 750 MG tablet Take 1 tablet (750 mg total) by mouth daily., Starting Sat 07/03/2016, Print    oxyCODONE-acetaminophen (PERCOCET/ROXICET) 5-325 MG tablet Take 1-2 tablets by mouth every 4 (four) hours as needed for severe pain., Starting Sat 07/03/2016, Print       I personally preformed the services scribed in my presence. The  recorded information has been reviewed is accurate. Virgel Manifold, MD.     Virgel Manifold, MD 07/11/16 [phone]

## 2016-07-05 ENCOUNTER — Ambulatory Visit (HOSPITAL_BASED_OUTPATIENT_CLINIC_OR_DEPARTMENT_OTHER): Payer: Medicaid Other

## 2016-07-05 ENCOUNTER — Ambulatory Visit (HOSPITAL_COMMUNITY)
Admission: RE | Admit: 2016-07-05 | Discharge: 2016-07-05 | Disposition: A | Discharge status: Home / Self Care | Payer: Medicaid Other | Source: Ambulatory Visit | Attending: Nurse Practitioner | Admitting: Nurse Practitioner

## 2016-07-05 ENCOUNTER — Ambulatory Visit (HOSPITAL_BASED_OUTPATIENT_CLINIC_OR_DEPARTMENT_OTHER): Payer: Medicaid Other | Admitting: Nurse Practitioner

## 2016-07-05 ENCOUNTER — Other Ambulatory Visit (HOSPITAL_BASED_OUTPATIENT_CLINIC_OR_DEPARTMENT_OTHER): Payer: Medicaid Other

## 2016-07-05 ENCOUNTER — Ambulatory Visit: Payer: Medicaid Other

## 2016-07-05 ENCOUNTER — Encounter (HOSPITAL_COMMUNITY): Payer: Self-pay

## 2016-07-05 ENCOUNTER — Other Ambulatory Visit: Payer: Self-pay | Admitting: Nurse Practitioner

## 2016-07-05 VITALS — BP 126/89 | HR 133 | Temp 98.1°F | Resp 20

## 2016-07-05 DIAGNOSIS — Z95828 Presence of other vascular implants and grafts: Secondary | ICD-10-CM

## 2016-07-05 DIAGNOSIS — J90 Pleural effusion, not elsewhere classified: Secondary | ICD-10-CM | POA: Diagnosis not present

## 2016-07-05 DIAGNOSIS — R05 Cough: Secondary | ICD-10-CM

## 2016-07-05 DIAGNOSIS — R590 Localized enlarged lymph nodes: Secondary | ICD-10-CM | POA: Insufficient documentation

## 2016-07-05 DIAGNOSIS — C3490 Malignant neoplasm of unspecified part of unspecified bronchus or lung: Secondary | ICD-10-CM

## 2016-07-05 DIAGNOSIS — C3492 Malignant neoplasm of unspecified part of left bronchus or lung: Secondary | ICD-10-CM

## 2016-07-05 DIAGNOSIS — C770 Secondary and unspecified malignant neoplasm of lymph nodes of head, face and neck: Secondary | ICD-10-CM

## 2016-07-05 DIAGNOSIS — E119 Type 2 diabetes mellitus without complications: Secondary | ICD-10-CM

## 2016-07-05 DIAGNOSIS — C3491 Malignant neoplasm of unspecified part of right bronchus or lung: Secondary | ICD-10-CM | POA: Diagnosis present

## 2016-07-05 DIAGNOSIS — R918 Other nonspecific abnormal finding of lung field: Secondary | ICD-10-CM | POA: Insufficient documentation

## 2016-07-05 DIAGNOSIS — J189 Pneumonia, unspecified organism: Secondary | ICD-10-CM

## 2016-07-05 DIAGNOSIS — R0602 Shortness of breath: Secondary | ICD-10-CM

## 2016-07-05 DIAGNOSIS — R079 Chest pain, unspecified: Secondary | ICD-10-CM

## 2016-07-05 DIAGNOSIS — E0800 Diabetes mellitus due to underlying condition with hyperosmolarity without nonketotic hyperglycemic-hyperosmolar coma (NKHHC): Secondary | ICD-10-CM

## 2016-07-05 DIAGNOSIS — J181 Lobar pneumonia, unspecified organism: Secondary | ICD-10-CM

## 2016-07-05 LAB — CBC WITH DIFFERENTIAL/PLATELET
BASO%: 0.2 % (ref 0.0–2.0)
Basophils Absolute: 0 10*3/uL (ref 0.0–0.1)
EOS ABS: 0.1 10*3/uL (ref 0.0–0.5)
EOS%: 2.2 % (ref 0.0–7.0)
HCT: 31.5 % — ABNORMAL LOW (ref 34.8–46.6)
HGB: 10 g/dL — ABNORMAL LOW (ref 11.6–15.9)
LYMPH%: 23.8 % (ref 14.0–49.7)
MCH: 26.5 pg (ref 25.1–34.0)
MCHC: 31.7 g/dL (ref 31.5–36.0)
MCV: 83.3 fL (ref 79.5–101.0)
MONO#: 0.5 10*3/uL (ref 0.1–0.9)
MONO%: 9.7 % (ref 0.0–14.0)
NEUT%: 64.1 % (ref 38.4–76.8)
NEUTROS ABS: 3.2 10*3/uL (ref 1.5–6.5)
PLATELETS: 278 10*3/uL (ref 145–400)
RBC: 3.78 10*6/uL (ref 3.70–5.45)
RDW: 13.7 % (ref 11.2–14.5)
WBC: 5 10*3/uL (ref 3.9–10.3)
lymph#: 1.2 10*3/uL (ref 0.9–3.3)

## 2016-07-05 LAB — COMPREHENSIVE METABOLIC PANEL
ALT: 12 U/L (ref 0–55)
ANION GAP: 12 meq/L — AB (ref 3–11)
AST: 11 U/L (ref 5–34)
Albumin: 2.9 g/dL — ABNORMAL LOW (ref 3.5–5.0)
Alkaline Phosphatase: 107 U/L (ref 40–150)
BUN: 7.9 mg/dL (ref 7.0–26.0)
CO2: 23 meq/L (ref 22–29)
Calcium: 9.3 mg/dL (ref 8.4–10.4)
Chloride: 102 mEq/L (ref 98–109)
Creatinine: 0.8 mg/dL (ref 0.6–1.1)
GLUCOSE: 402 mg/dL — AB (ref 70–140)
POTASSIUM: 3.8 meq/L (ref 3.5–5.1)
SODIUM: 138 meq/L (ref 136–145)
Total Bilirubin: 0.31 mg/dL (ref 0.20–1.20)
Total Protein: 7.2 g/dL (ref 6.4–8.3)

## 2016-07-05 MED ORDER — ACETAMINOPHEN 325 MG PO TABS
650.0000 mg | ORAL_TABLET | Freq: Once | ORAL | Status: AC
  Administered 2016-07-05: 650 mg via ORAL

## 2016-07-05 MED ORDER — DIPHENHYDRAMINE HCL 25 MG PO CAPS
ORAL_CAPSULE | ORAL | Status: AC
Start: 2016-07-05 — End: 2016-07-05
  Filled 2016-07-05: qty 1

## 2016-07-05 MED ORDER — SODIUM CHLORIDE 0.9 % IV SOLN
Freq: Once | INTRAVENOUS | Status: AC
  Administered 2016-07-05: 12:00:00 via INTRAVENOUS

## 2016-07-05 MED ORDER — SODIUM CHLORIDE 0.9 % IV SOLN: 9.5000 mg/kg | Freq: Once | INTRAVENOUS | Status: DC

## 2016-07-05 MED ORDER — HEPARIN SOD (PORK) LOCK FLUSH 100 UNIT/ML IV SOLN
500.0000 [IU] | Freq: Once | INTRAVENOUS | Status: AC
  Administered 2016-07-05: 250 [IU] via INTRAVENOUS

## 2016-07-05 MED ORDER — SODIUM CHLORIDE 0.9% FLUSH
10.0000 mL | INTRAVENOUS | Status: DC | PRN
  Administered 2016-07-05: 10 mL
  Filled 2016-07-05: qty 10

## 2016-07-05 MED ORDER — SODIUM CHLORIDE 0.9 % IV SOLN
Freq: Once | INTRAVENOUS | Status: AC
  Administered 2016-07-05: 13:00:00 via INTRAVENOUS

## 2016-07-05 MED ORDER — HEPARIN SOD (PORK) LOCK FLUSH 100 UNIT/ML IV SOLN
INTRAVENOUS | Status: AC
  Filled 2016-07-05: qty 5

## 2016-07-05 MED ORDER — IOPAMIDOL (ISOVUE-370) INJECTION 76%
INTRAVENOUS | Status: AC
  Filled 2016-07-05: qty 100

## 2016-07-05 MED ORDER — ACETAMINOPHEN 325 MG PO TABS
ORAL_TABLET | ORAL | Status: AC
  Filled 2016-07-05: qty 2

## 2016-07-05 MED ORDER — DIPHENHYDRAMINE HCL 25 MG PO TABS
25.0000 mg | ORAL_TABLET | Freq: Once | ORAL | Status: AC
  Administered 2016-07-05: 25 mg via ORAL
  Filled 2016-07-05: qty 1

## 2016-07-05 MED ORDER — SODIUM CHLORIDE 0.9 % IJ SOLN
10.0000 mL | INTRAMUSCULAR | Status: DC | PRN
  Administered 2016-07-05: 10 mL via INTRAVENOUS
  Filled 2016-07-05: qty 10

## 2016-07-05 MED ORDER — INSULIN REGULAR HUMAN 100 UNIT/ML IJ SOLN
15.0000 [IU] | Freq: Once | INTRAMUSCULAR | Status: AC
  Administered 2016-07-05: 15 [IU] via SUBCUTANEOUS
  Filled 2016-07-05: qty 0.15

## 2016-07-05 MED ORDER — IOPAMIDOL (ISOVUE-370) INJECTION 76%
100.0000 mL | Freq: Once | INTRAVENOUS | Status: AC | PRN
  Administered 2016-07-05: 66 mL via INTRAVENOUS

## 2016-07-05 MED ORDER — HEPARIN SOD (PORK) LOCK FLUSH 100 UNIT/ML IV SOLN
250.0000 [IU] | Freq: Once | INTRAVENOUS | Status: AC | PRN
  Administered 2016-07-05: 250 [IU]
  Filled 2016-07-05: qty 5

## 2016-07-05 NOTE — Patient Instructions (Signed)
Hyperglycemia Hyperglycemia is when the sugar (glucose) level in your blood is too high. It may not cause symptoms. If you do have symptoms, they may include warning signs, such as:  Feeling more thirsty than normal.  Hunger.  Feeling tired.  Needing to pee (urinate) more than normal.  Blurry eyesight (vision). You may get other symptoms as it gets worse, such as:  Dry mouth.  Not being hungry (loss of appetite).  Fruity-smelling breath.  Weakness.  Weight gain or loss that is not planned. Weight loss may be fast.  A tingling or numb feeling in your hands or feet.  Headache.  Skin that does not bounce back quickly when it is lightly pinched and released (poor skin turgor).  Pain in your belly (abdomen).  Cuts or bruises that heal slowly. High blood sugar can happen to people who do or do not have diabetes. High blood sugar can happen slowly or quickly, and it can be an emergency. Follow these instructions at home: General instructions  Take over-the-counter and prescription medicines only as told by your doctor.  Do not use products that contain nicotine or tobacco, such as cigarettes and e-cigarettes. If you need help quitting, ask your doctor.  Limit alcohol intake to no more than 1 drink per day for nonpregnant women and 2 drinks per day for men. One drink equals 12 oz of beer, 5 oz of wine, or 1 oz of hard liquor.  Manage stress. If you need help with this, ask your doctor.  Keep all follow-up visits as told by your doctor. This is important. Eating and drinking  Stay at a healthy weight.  Exercise regularly, as told by your doctor.  Drink enough fluid, especially when you:  Exercise.  Get sick.  Are in hot temperatures.  Eat healthy foods, such as:  Low-fat (lean) proteins.  Complex carbs (complex carbohydrates), such as whole wheat bread or brown rice.  Fresh fruits and vegetables.  Low-fat dairy products.  Healthy fats.  Drink enough  fluid to keep your pee (urine) clear or pale yellow. If you have diabetes:  Make sure you know the symptoms of hyperglycemia.  Follow your diabetes management plan, as told by your doctor. Make sure you:  Take insulin and medicines as told.  Follow your exercise plan.  Follow your meal plan. Eat on time. Do not skip meals.  Check your blood sugar as often as told. Make sure to check before and after exercise. If you exercise longer or in a different way than you normally do, check your blood sugar more often.  Follow your sick day plan whenever you cannot eat or drink normally. Make this plan ahead of time with your doctor.  Share your diabetes management plan with people in your workplace, school, and household.  Check your urine for ketones when you are ill and as told by your doctor.  Carry a card or wear jewelry that says that you have diabetes. Contact a doctor if:  Your blood sugar level is higher than 240 mg/dL (13.3 mmol/L) for 2 days in a row.  You have problems keeping your blood sugar in your target range.  High blood sugar happens often for you. Get help right away if:  You have trouble breathing.  You have a change in how you think, feel, or act (mental status).  You feel sick to your stomach (nauseous), and that feeling does not go away.  You cannot stop throwing up (vomiting). These symptoms may be an   emergency. Do not wait to see if the symptoms will go away. Get medical help right away. Call your local emergency services (911 in the [place] Do not drive yourself to the hospital.  Summary  Hyperglycemia is when the sugar (glucose) level in your blood is too high.  High blood sugar can happen to people who do or do not have diabetes.  Make sure you drink enough fluids, eat healthy foods, and exercise regularly.  Contact your doctor if you have problems keeping your blood sugar in your target range. This information is not intended to replace advice given  to you by your health care provider. Make sure you discuss any questions you have with your health care provider. Document Released: 01/17/2009 Document Revised: 12/08/2015 Document Reviewed: 12/08/2015 Elsevier Interactive Patient Education  2017 [employer]  

## 2016-07-05 NOTE — Patient Instructions (Signed)
PICC Home Guide °A peripherally inserted central catheter (PICC) is a long, thin, flexible tube that is inserted into a vein in the upper arm. It is a form of intravenous (IV) access. It is considered to be a "central" line because the tip of the PICC ends in a large vein in your chest. This large vein is called the superior vena cava (SVC). The PICC tip ends in the SVC because there is a lot of blood flow in the SVC. This allows medicines and IV fluids to be quickly distributed throughout the body. The PICC is inserted using a sterile technique by a specially trained nurse or physician. After the PICC is inserted, a chest X-ray exam is done to be sure it is in the correct place. °A PICC may be placed for different reasons, such as: °· To give medicines and liquid nutrition that can only be given through a central line. Examples are: °? Certain antibiotic treatments. °? Chemotherapy. °? Total parenteral nutrition (TPN). °· To take frequent blood samples. °· To give IV fluids and blood products. °· If there is difficulty placing a peripheral intravenous (PIV) catheter. ° °If taken care of properly, a PICC can remain in place for several months. A PICC can also allow a person to go home from the hospital early. Medicine and PICC care can be managed at home by a family member or home health care team. °What problems can happen when I have a PICC? °Problems with a PICC can occasionally occur. These may include the following: °· A blood clot (thrombus) forming in or at the tip of the PICC. This can cause the PICC to become clogged. A clot-dissolving medicine called tissue plasminogen activator (tPA) can be given through the PICC to help break up the clot. °· Inflammation of the vein (phlebitis) in which the PICC is placed. Signs of inflammation may include redness, pain at the insertion site, red streaks, or being able to feel a "cord" in the vein where the PICC is located. °· Infection in the PICC or at the insertion  site. Signs of infection may include fever, chills, redness, swelling, or pus drainage from the PICC insertion site. °· PICC movement (malposition). The PICC tip may move from its original position due to excessive physical activity, forceful coughing, sneezing, or vomiting. °· A break or cut in the PICC. It is important to not use scissors near the PICC. °· Nerve or tendon irritation or injury during PICC insertion. ° °What should I keep in mind about activities when I have a PICC? °· You may bend your arm and move it freely. If your PICC is near or at the bend of your elbow, avoid activity with repeated motion at the elbow. °· Rest at home for the remainder of the day following PICC line insertion. °· Avoid lifting heavy objects as instructed by your health care provider. °· Avoid using a crutch with the arm on the same side as your PICC. You may need to use a walker. °What should I know about my PICC dressing? °· Keep your PICC bandage (dressing) clean and dry to prevent infection. °? Ask your health care provider when you may shower. Ask your health care provider to teach you how to wrap the PICC when you do take a shower. °· Change the PICC dressing as instructed by your health care provider. °· Change your PICC dressing if it becomes loose or wet. °What should I know about PICC care? °· Check the PICC insertion   site daily for leakage, redness, swelling, or pain. °· Do not take a bath, swim, or use hot tubs when you have a PICC. Cover PICC line with clear plastic wrap and tape to keep it dry while showering. °· Flush the PICC as directed by your health care provider. Let your health care provider know right away if the PICC is difficult to flush or does not flush. Do not use force to flush the PICC. °· Do not use a syringe that is less than 10 mL to flush the PICC. °· Never pull or tug on the PICC. °· Avoid blood pressure checks on the arm with the PICC. °· Keep your PICC identification card with you at all  times. °· Do not take the PICC out yourself. Only a trained clinical professional should remove the PICC. °Get help right away if: °· Your PICC is accidentally pulled all the way out. If this happens, cover the insertion site with a bandage or gauze dressing. Do not throw the PICC away. Your health care provider will need to inspect it. °· Your PICC was tugged or pulled and has partially come out. Do not  push the PICC back in. °· There is any type of drainage, redness, or swelling where the PICC enters the skin. °· You cannot flush the PICC, it is difficult to flush, or the PICC leaks around the insertion site when it is flushed. °· You hear a "flushing" sound when the PICC is flushed. °· You have pain, discomfort, or numbness in your arm, shoulder, or jaw on the same side as the PICC. °· You feel your heart "racing" or skipping beats. °· You notice a hole or tear in the PICC. °· You develop chills or a fever. °This information is not intended to replace advice given to you by your health care provider. Make sure you discuss any questions you have with your health care provider. °Document Released: 09/26/2002 Document Revised: 10/10/2015 Document Reviewed: 01/12/2013 °Elsevier Interactive Patient Education © 2017 [employer] ° °

## 2016-07-05 NOTE — Patient Instructions (Signed)
[facility] Discharge Instructions for Patients Receiving Chemotherapy  Today you received the following chemotherapy agents:  Imfinzi.  To help prevent nausea and vomiting after your treatment, we encourage you to take your nausea medication as directed.   If you develop nausea and vomiting that is not controlled by your nausea medication, call the clinic.   BELOW ARE SYMPTOMS THAT SHOULD BE REPORTED IMMEDIATELY:  *FEVER GREATER THAN 100.5 F  *CHILLS WITH OR WITHOUT FEVER  NAUSEA AND VOMITING THAT IS NOT CONTROLLED WITH YOUR NAUSEA MEDICATION  *UNUSUAL SHORTNESS OF BREATH  *UNUSUAL BRUISING OR BLEEDING  TENDERNESS IN MOUTH AND THROAT WITH OR WITHOUT PRESENCE OF ULCERS  *URINARY PROBLEMS  *BOWEL PROBLEMS  UNUSUAL RASH Items with * indicate a potential emergency and should be followed up as soon as possible.  Feel free to call the clinic you have any questions or concerns. The clinic phone number is [phone].  Please show the CHEMO ALERT CARD at check-in to the Emergency Department and triage nurse.   

## 2016-07-06 ENCOUNTER — Encounter: Payer: Self-pay | Admitting: Nurse Practitioner

## 2016-07-06 NOTE — Progress Notes (Signed)
Patient presented to infusion room with c/o pleuritic CP and dyspnea. Was seen in ED for same with dx of PNA. Treatment held d/t questionable pneumonitis per Selena Lesser, NP. Patient escorted to [facility] for IVF's and further management.

## 2016-07-06 NOTE — Assessment & Plan Note (Signed)
Patient has been diagnosed with lung cancer.  She also has a chronic history of tachycardia as baseline.  Patient states that she presented to the emergency department over this past weekend on Saturday, 07/03/2016 for increased cough, shortness of breath, and chest wall discomfort.  She also had some discomfort with inspiration as well.  Chest x-ray obtained per the emergency department revealed questionable pneumonia versus pleural effusion.  Patient was given a prescription for Levaquin antibiotics while at the emergency department.  Patient states she has taken antibiotics as directed.  She continues with the same symptoms today.  She denies any recent fevers or chills.  On exam today.  Lungs essentially clear bilaterally with no cough or wheeze.  Patient has no obvious shortness of breath and appears comfortable.  Vital signs were essentially stable as well with the exception of the tachycardia as patient's baseline.  O2 sat was within normal range as well.  Reviewed all symptoms with Dr. Julien Nordmann on call physician; he recommended the patient undergo a CT angiogram of the chest to rule out PE.  CT angio of the chest revealed:    IMPRESSION: 1. No pulmonary emboli. 2. New moderate right pleural effusion. 3. Increase consolidation in the right upper and lower lobes, probably radiation effect. 4. Slight increase in precarinal adenopathy.  Reviewed findings of the CT scan with Dr. Julien Nordmann; and then reviewed the CT results with the patient.  Advised patient there was no evidence of a pulmonary embolism, on the scan; but it did appear the patient had questionable pneumonia versus increased pleural effusion.  Also, questionable consideration of pneumonitis.  Since patient has significant diabetes-will hold on prescribing steroids for treatment of any possible pneumonitis.  Issues for the time being.  Patient was advised to continue taking the Levaquin as previously directed.  Also, advised  patient to call/return or go directly to the emergency department if her symptoms persist or worsen.  Whatsoever.  Would need to consider a possible thoracentesis and/or initiation of steroids for treatment of possible pneumonitis is symptoms do not improve/resolve.

## 2016-07-06 NOTE — Assessment & Plan Note (Signed)
Patient received her last cycle of Durvalumab infusion on 06/21/2016.  See further notes for details of today's visit.  Patient is scheduled to return for labs, flush, visit, and her next cycle of therapy on 07/19/2016.

## 2016-07-06 NOTE — Progress Notes (Signed)
SYMPTOM MANAGEMENT CLINIC    Chief Complaint: Cough  HPI:  Marilyn [name] 51 y.o. female diagnosed with lung cancer.  Currently undergoing Durnalumab infusions.     No history exists.    Review of Systems  Constitutional: Positive for malaise/fatigue.  Respiratory: Positive for cough and shortness of breath.   Cardiovascular: Positive for chest pain.  All other systems reviewed and are negative.   Past Medical History:  Diagnosis Date  . Asthma   . Diabetes mellitus   . Hypercholesteremia   . Hypertension   . Lung cancer ([place])   . Neuropathy ([place])   . Obese     Past Surgical History:  Procedure Laterality Date  . CESAREAN SECTION    . IR GENERIC HISTORICAL  01/08/2016   IR US GUIDE VASC ACCESS RIGHT 01/08/2016 [name], DO [facility]  . IR GENERIC HISTORICAL  01/08/2016   IR FLUORO GUIDE CV LINE RIGHT 01/08/2016 [name], DO [facility]  . TUBAL LIGATION    . US guided core needle biopsy     of right lower neck/supraclavicular lymph nodes    has Chest wall pain, chronic; Headache; Diabetes mellitus ([place]); Mediastinal mass; Non-small cell lung cancer ([place]); Adenocarcinoma of left lung, stage 3 ([place]); Dehydration; Tachycardia; Port catheter in place; Palliative care by specialist; Thrush of mouth and esophagus ([place]); Constipation; Community acquired pneumonia; and Sinus tachycardia on her problem list.    has No Known Allergies.  Allergies as of 07/05/2016   No Known Allergies     Medication List       Accurate as of 07/05/16 11:59 PM. Always use your most recent med list.          aspirin EC 81 MG tablet Take 1 tablet (81 mg total) by mouth daily.   chlorhexidine 0.12 % solution Commonly known as:  PERIDEX Use as directed 15 mLs in the mouth or throat 2 (two) times daily.   diltiazem 120 MG 24 hr capsule Commonly known as:  CARDIZEM CD Take 1 capsule (120 mg total) by mouth daily. Start taking this instead of your (Metoprolol/Toprol XL).     dronabinol 2.5 MG capsule Commonly known as:  MARINOL Take 1 capsule (2.5 mg total) by mouth 2 (two) times daily before a meal.   DULoxetine 30 MG capsule Commonly known as:  CYMBALTA Take 1 capsule (30 mg total) by mouth daily.   fluconazole 100 MG tablet Commonly known as:  DIFLUCAN Take 1 tablet (100 mg total) by mouth daily.   Fluocinonide 0.1 % Crea Apply 1 application topically daily.   HYDROcodone-acetaminophen 7.5-325 mg/15 ml solution Commonly known as:  HYCET Take 15 mLs by mouth 4 (four) times daily as needed for moderate pain.   insulin aspart 100 UNIT/ML FlexPen Commonly known as:  NOVOLOG As per sliding scale instructions   levofloxacin 750 MG tablet Commonly known as:  LEVAQUIN Take 1 tablet (750 mg total) by mouth daily.   lidocaine-prilocaine cream Commonly known as:  EMLA Apply to affected area once   loratadine 10 MG tablet Commonly known as:  CLARITIN Take 10 mg by mouth daily.   magic mouthwash w/lidocaine Soln Take 5 mLs by mouth 4 (four) times daily as needed for mouth pain.   nystatin 100000 UNIT/ML suspension Commonly known as:  MYCOSTATIN Take 5 mLs (500,000 Units total) by mouth 4 (four) times daily.   oxyCODONE-acetaminophen 5-325 MG tablet Commonly known as:  PERCOCET/ROXICET Take 1-2 tablets by mouth every 4 (four)  hours as needed for severe pain.   polyethylene glycol packet Commonly known as:  MIRALAX Take 17 g by mouth daily.   predniSONE 20 MG tablet Commonly known as:  DELTASONE 3 tabs po day one, then 2 tabs daily x 4 days   promethazine 25 MG tablet Commonly known as:  PHENERGAN Take 1 tablet (25 mg total) by mouth every 6 (six) hours as needed for nausea or vomiting.   senna-docusate 8.6-50 MG tablet Commonly known as:  SENNA S Take 2 tablets by mouth 2 (two) times daily. May reduce to 2 tab po HS as needed once regular BM estabished   SONAFINE Apply 1 application topically 2 (two) times daily.   sucralfate 1 g  tablet Commonly known as:  CARAFATE Take 1 tablet (1 g total) by mouth 4 (four) times daily -  with meals and at bedtime. 5 min before meals for radiation induced esophagitis   traZODone 50 MG tablet Commonly known as:  DESYREL Take 1-2 tablets (50-100 mg total) by mouth at bedtime as needed for sleep.   TRESIBA FLEXTOUCH 200 UNIT/ML Sopn Generic drug:  Insulin Degludec Inject 100 Units into the skin daily before breakfast.        PHYSICAL EXAMINATION  Oncology Vitals 07/05/2016 07/03/2016  Height - -  Weight - -  Weight (lbs) - -  BMI (kg/m2) - -  Temp 98.1 -  Pulse 133 118  Resp 20 16  SpO2 100 97  BSA (m2) - -   BP Readings from Last 2 Encounters:  07/05/16 126/89  07/03/16 (!) 116/94    Physical Exam  Constitutional: She is oriented to person, place, and time. She appears unhealthy.  HENT:  Head: Normocephalic and atraumatic.  Mouth/Throat: Oropharynx is clear and moist.  Eyes: Conjunctivae and EOM are normal. Pupils are equal, round, and reactive to light. Right eye exhibits no discharge. Left eye exhibits no discharge. No scleral icterus.  Neck: Normal range of motion. Neck supple. No JVD present. No tracheal deviation present. No thyromegaly present.  Cardiovascular: Normal rate, regular rhythm, normal heart sounds and intact distal pulses.   Pulmonary/Chest: Effort normal and breath sounds normal. No respiratory distress. She has no wheezes. She has no rales. She exhibits no tenderness.  Abdominal: Soft. Bowel sounds are normal. She exhibits no distension and no mass. There is no tenderness. There is no rebound and no guarding.  Musculoskeletal: Normal range of motion. She exhibits no edema or tenderness.  Lymphadenopathy:    She has no cervical adenopathy.  Neurological: She is alert and oriented to person, place, and time. Gait normal.  Skin: Skin is warm and dry. No rash noted. No erythema. No pallor.  Psychiatric: Affect normal.  Nursing note and vitals  reviewed.   LABORATORY DATA:. Appointment on 07/05/2016  Component Date Value Ref Range Status  . WBC 07/05/2016 5.0  3.9 - 10.3 10e3/uL Final  . NEUT# 07/05/2016 3.2  1.5 - 6.5 10e3/uL Final  . HGB 07/05/2016 10.0* 11.6 - 15.9 g/dL Final  . HCT 07/05/2016 31.5* 34.8 - 46.6 % Final  . Platelets 07/05/2016 278  145 - 400 10e3/uL Final  . MCV 07/05/2016 83.3  79.5 - 101.0 fL Final  . MCH 07/05/2016 26.5  25.1 - 34.0 pg Final  . MCHC 07/05/2016 31.7  31.5 - 36.0 g/dL Final  . RBC 07/05/2016 3.78  3.70 - 5.45 10e6/uL Final  . RDW 07/05/2016 13.7  11.2 - 14.5 % Final  . lymph# 07/05/2016 1.2  0.9 - 3.3 10e3/uL Final  . MONO# 07/05/2016 0.5  0.1 - 0.9 10e3/uL Final  . Eosinophils Absolute 07/05/2016 0.1  0.0 - 0.5 10e3/uL Final  . Basophils Absolute 07/05/2016 0.0  0.0 - 0.1 10e3/uL Final  . NEUT% 07/05/2016 64.1  38.4 - 76.8 % Final  . LYMPH% 07/05/2016 23.8  14.0 - 49.7 % Final  . MONO% 07/05/2016 9.7  0.0 - 14.0 % Final  . EOS% 07/05/2016 2.2  0.0 - 7.0 % Final  . BASO% 07/05/2016 0.2  0.0 - 2.0 % Final  . Sodium 07/05/2016 138  136 - 145 mEq/L Final  . Potassium 07/05/2016 3.8  3.5 - 5.1 mEq/L Final  . Chloride 07/05/2016 102  98 - 109 mEq/L Final  . CO2 07/05/2016 23  22 - 29 mEq/L Final  . Glucose 07/05/2016 402* 70 - 140 mg/dl Final  . BUN 07/05/2016 7.9  7.0 - 26.0 mg/dL Final  . Creatinine 07/05/2016 0.8  0.6 - 1.1 mg/dL Final  . Total Bilirubin 07/05/2016 0.31  0.20 - 1.20 mg/dL Final  . Alkaline Phosphatase 07/05/2016 107  40 - 150 U/L Final  . AST 07/05/2016 11  5 - 34 U/L Final  . ALT 07/05/2016 12  0 - 55 U/L Final  . Total Protein 07/05/2016 7.2  6.4 - 8.3 g/dL Final  . Albumin 07/05/2016 2.9* 3.5 - 5.0 g/dL Final  . Calcium 07/05/2016 9.3  8.4 - 10.4 mg/dL Final  . Anion Gap 07/05/2016 12* 3 - 11 mEq/L Final  . EGFR 07/05/2016 >90  >90 ml/min/1.73 m2 Final  Admission on 07/03/2016, Discharged on 07/03/2016  Component Date Value Ref Range Status  . WBC 07/03/2016  7.2  4.0 - 10.5 K/uL Final  . RBC 07/03/2016 3.62* 3.87 - 5.11 MIL/uL Final  . Hemoglobin 07/03/2016 10.0* 12.0 - 15.0 g/dL Final  . HCT 07/03/2016 30.3* 36.0 - 46.0 % Final  . MCV 07/03/2016 83.7  78.0 - 100.0 fL Final  . MCH 07/03/2016 27.6  26.0 - 34.0 pg Final  . MCHC 07/03/2016 33.0  30.0 - 36.0 g/dL Final  . RDW 07/03/2016 13.8  11.5 - 15.5 % Final  . Platelets 07/03/2016 306  150 - 400 K/uL Final  . Neutrophils Relative % 07/03/2016 74  % Final  . Neutro Abs 07/03/2016 5.3  1.7 - 7.7 K/uL Final  . Lymphocytes Relative 07/03/2016 18  % Final  . Lymphs Abs 07/03/2016 1.3  0.7 - 4.0 K/uL Final  . Monocytes Relative 07/03/2016 6  % Final  . Monocytes Absolute 07/03/2016 0.5  0.1 - 1.0 K/uL Final  . Eosinophils Relative 07/03/2016 2  % Final  . Eosinophils Absolute 07/03/2016 0.1  0.0 - 0.7 K/uL Final  . Basophils Relative 07/03/2016 0  % Final  . Basophils Absolute 07/03/2016 0.0  0.0 - 0.1 K/uL Final  . Sodium 07/03/2016 137  135 - 145 mmol/L Final  . Potassium 07/03/2016 3.8  3.5 - 5.1 mmol/L Final  . Chloride 07/03/2016 102  101 - 111 mmol/L Final  . CO2 07/03/2016 27  22 - 32 mmol/L Final  . Glucose, Bld 07/03/2016 330* 65 - 99 mg/dL Final  . BUN 07/03/2016 9  6 - 20 mg/dL Final  . Creatinine, Ser 07/03/2016 0.54  0.44 - 1.00 mg/dL Final  . Calcium 07/03/2016 8.9  8.9 - 10.3 mg/dL Final  . GFR calc non Af Amer 07/03/2016 >60  >60 mL/min Final  . GFR calc Af Amer 07/03/2016 >60  >60  mL/min Final   Comment: (NOTE) The eGFR has been calculated using the CKD EPI equation. This calculation has not been validated in all clinical situations. eGFR's persistently <60 mL/min signify possible Chronic Kidney Disease.   . Anion gap 07/03/2016 8  5 - 15 Final  . Troponin I 07/03/2016 <0.03  <0.03 ng/mL Final    RADIOGRAPHIC STUDIES: Dg Chest 2 View  Result Date: 07/03/2016 CLINICAL DATA:  PT C/O ANTERIOR CP AND SOB X 2 DAYS HX BREAST CA W/CHEMO EXAM: CHEST  2 VIEW COMPARISON:   05/14/2016 FINDINGS: There is new opacity in the right lower lung consistent with right middle lobe atelectasis. Remainder of the lungs is clear. There is right peritracheal mediastinal thickening and masslike soft tissue fullness extending superior to the right hilum, which appears increased when compared to the prior chest radiograph. Cardiac silhouette is normal in size. The left hilar contours are normal. No pleural effusion.  No pneumothorax. Right PICC is well positioned, tip at the caval atrial junction. IMPRESSION: 1. New right lower lung opacity consistent with right middle lobe atelectasis. A component of postobstructive consolidation or pneumonia is possible. Remainder of the lungs is clear. 2. Right peritracheal and superior hilar mass increased when compared to the prior chest radiograph. Consider further assessment of these findings with chest CT with contrast. Electronically Signed   By: Lajean Manes M.D.   On: 07/03/2016 17:56   Ct Angio Chest Pe W Or Wo Contrast  Result Date: 07/05/2016 CLINICAL DATA:  Left-sided chest pain with shortness of breath and cough since 07/01/2016. Non-small cell lung cancer diagnosed in September 2017. EXAM: CT ANGIOGRAPHY CHEST WITH CONTRAST TECHNIQUE: Multidetector CT imaging of the chest was performed using the standard protocol during bolus administration of intravenous contrast. Multiplanar CT image reconstructions and MIPs were obtained to evaluate the vascular anatomy. CONTRAST:  66 cc Isovue 370 COMPARISON:  Chest x-ray dated 07/03/2016 and CT scan dated 05/14/2016 FINDINGS: Cardiovascular: Satisfactory opacification of the pulmonary arteries to the segmental level. No evidence of pulmonary embolism. Normal heart size. No pericardial effusion. Mediastinum/Nodes: There is precarinal adenopathy with the nodal mass measuring 3.8 cm, slightly increased from 3.6 cm on the prior study. Lungs/Pleura: There is a new moderate right pleural effusion with increased  consolidation in the right upper and lower lobes, which I suspect is primarily due to radiation effect. Upper Abdomen: No acute abnormality. Musculoskeletal: No chest wall abnormality. No acute or significant osseous findings. Review of the MIP images confirms the above findings. IMPRESSION: 1. No pulmonary emboli. 2. New moderate right pleural effusion. 3. Increase consolidation in the right upper and lower lobes, probably radiation effect. 4. Slight increase in precarinal adenopathy. Electronically Signed   By: Lorriane Shire M.D.   On: 07/05/2016 15:30    ASSESSMENT/PLAN:    Non-small cell lung cancer [facility]) Patient received her last cycle of Durvalumab infusion on 06/21/2016.  See further notes for details of today's visit.  Patient is scheduled to return for labs, flush, visit, and her next cycle of therapy on 07/19/2016.  Diabetes mellitus ([place]) Patient has history of diabetes.  Patient's blood sugar was 402, with labs drawn today.  Patient states she did not have time to take her diabetic medications earlier this morning prior to her cancer Center appointment.  Patient was given 15 units of regular insulin subcutaneously while at the [facility]; and also received IV fluid rehydration.  Patient was encouraged to go home and recheck her blood sugar and treat her  blood sugars accordingly.  She was also encouraged to call/return or go directly to the emergency department for any worsening symptoms whatsoever.  Community acquired pneumonia Patient has been diagnosed with lung cancer.  She also has a chronic history of tachycardia as baseline.  Patient states that she presented to the emergency department over this past weekend on Saturday, 07/03/2016 for increased cough, shortness of breath, and chest wall discomfort.  She also had some discomfort with inspiration as well.  Chest x-ray obtained per the emergency department revealed questionable pneumonia versus pleural effusion.  Patient was  given a prescription for Levaquin antibiotics while at the emergency department.  Patient states she has taken antibiotics as directed.  She continues with the same symptoms today.  She denies any recent fevers or chills.  On exam today.  Lungs essentially clear bilaterally with no cough or wheeze.  Patient has no obvious shortness of breath and appears comfortable.  Vital signs were essentially stable as well with the exception of the tachycardia as patient's baseline.  O2 sat was within normal range as well.  Reviewed all symptoms with Dr. Julien Nordmann on call physician; he recommended the patient undergo a CT angiogram of the chest to rule out PE.  CT angio of the chest revealed:    IMPRESSION: 1. No pulmonary emboli. 2. New moderate right pleural effusion. 3. Increase consolidation in the right upper and lower lobes, probably radiation effect. 4. Slight increase in precarinal adenopathy.  Reviewed findings of the CT scan with Dr. Julien Nordmann; and then reviewed the CT results with the patient.  Advised patient there was no evidence of a pulmonary embolism, on the scan; but it did appear the patient had questionable pneumonia versus increased pleural effusion.  Also, questionable consideration of pneumonitis.  Since patient has significant diabetes-will hold on prescribing steroids for treatment of any possible pneumonitis.  Issues for the time being.  Patient was advised to continue taking the Levaquin as previously directed.  Also, advised patient to call/return or go directly to the emergency department if her symptoms persist or worsen.  Whatsoever.  Would need to consider a possible thoracentesis and/or initiation of steroids for treatment of possible pneumonitis is symptoms do not improve/resolve.    Patient stated understanding of all instructions; and was in agreement with this plan of care. The patient knows to call the clinic with any problems, questions or concerns.   Total time spent  with patient was 40 minutes;  with greater than 75 percent of that time spent in face to face counseling regarding patient's symptoms,  and coordination of care and follow up.  Disclaimer:This dictation was prepared with Dragon/digital dictation along with [employer]. Any transcriptional errors that result from this process are unintentional.  Drue Second, NP 07/06/2016

## 2016-07-06 NOTE — Assessment & Plan Note (Signed)
Patient has history of diabetes.  Patient's blood sugar was 402, with labs drawn today.  Patient states she did not have time to take her diabetic medications earlier this morning prior to her cancer Center appointment.  Patient was given 15 units of regular insulin subcutaneously while at the [place]; and also received IV fluid rehydration.  Patient was encouraged to go home and recheck her blood sugar and treat her blood sugars accordingly.  She was also encouraged to call/return or go directly to the emergency department for any worsening symptoms whatsoever.

## 2016-07-07 ENCOUNTER — Telehealth: Payer: Self-pay | Admitting: *Deleted

## 2016-07-07 NOTE — Telephone Encounter (Signed)
[name] patient to follow up on [facility] visit on 07/05/16. No answer. Left VM message for pt to call back with any questions or concerns.

## 2016-07-14 ENCOUNTER — Other Ambulatory Visit: Payer: Self-pay | Admitting: *Deleted

## 2016-07-14 ENCOUNTER — Telehealth: Payer: Self-pay | Admitting: *Deleted

## 2016-07-14 ENCOUNTER — Other Ambulatory Visit: Payer: Self-pay | Admitting: Hematology

## 2016-07-14 DIAGNOSIS — J90 Pleural effusion, not elsewhere classified: Secondary | ICD-10-CM

## 2016-07-14 NOTE — Telephone Encounter (Signed)
Per staff message, called pt to instruct to have CXR done.  No answer, LVM instructing pt to go to [facility] long radiology for CXR anytime prior to apt on 4/16.  Call back number provided.

## 2016-07-14 NOTE — Telephone Encounter (Signed)
-----   Message from [name], MD sent at 07/14/2016 12:56 AM EDT ----- Delle Reining, COuld you plz have schedulers setup the patient to have CXR prior to seeing me on 4/16. Thanks [place]

## 2016-07-16 ENCOUNTER — Ambulatory Visit (HOSPITAL_COMMUNITY)
Admission: RE | Admit: 2016-07-16 | Discharge: 2016-07-16 | Disposition: A | Discharge status: Home / Self Care | Payer: Medicaid Other | Source: Ambulatory Visit | Attending: Hematology | Admitting: Hematology

## 2016-07-16 ENCOUNTER — Ambulatory Visit (HOSPITAL_BASED_OUTPATIENT_CLINIC_OR_DEPARTMENT_OTHER): Payer: Medicaid Other

## 2016-07-16 DIAGNOSIS — Z452 Encounter for adjustment and management of vascular access device: Secondary | ICD-10-CM | POA: Insufficient documentation

## 2016-07-16 DIAGNOSIS — R918 Other nonspecific abnormal finding of lung field: Secondary | ICD-10-CM | POA: Insufficient documentation

## 2016-07-16 DIAGNOSIS — Z95828 Presence of other vascular implants and grafts: Secondary | ICD-10-CM

## 2016-07-16 DIAGNOSIS — J90 Pleural effusion, not elsewhere classified: Secondary | ICD-10-CM | POA: Diagnosis not present

## 2016-07-16 DIAGNOSIS — C3491 Malignant neoplasm of unspecified part of right bronchus or lung: Secondary | ICD-10-CM | POA: Diagnosis not present

## 2016-07-16 DIAGNOSIS — C770 Secondary and unspecified malignant neoplasm of lymph nodes of head, face and neck: Secondary | ICD-10-CM

## 2016-07-16 MED ORDER — HEPARIN SOD (PORK) LOCK FLUSH 100 UNIT/ML IV SOLN
250.0000 [IU] | Freq: Once | INTRAVENOUS | Status: AC
  Administered 2016-07-16: 250 [IU]
  Filled 2016-07-16: qty 5

## 2016-07-16 MED ORDER — SODIUM CHLORIDE 0.9% FLUSH
10.0000 mL | Freq: Once | INTRAVENOUS | Status: AC
  Administered 2016-07-16: 10 mL
  Filled 2016-07-16: qty 10

## 2016-07-19 ENCOUNTER — Ambulatory Visit: Payer: Medicaid Other

## 2016-07-19 ENCOUNTER — Other Ambulatory Visit: Payer: Medicaid Other

## 2016-07-19 ENCOUNTER — Ambulatory Visit: Payer: Medicaid Other | Admitting: Hematology

## 2016-07-20 ENCOUNTER — Telehealth: Payer: Self-pay | Admitting: Hematology

## 2016-07-20 NOTE — Telephone Encounter (Signed)
Spoke with patient re appointments for 4/23. No availability this week - desk nurse aware.

## 2016-07-23 ENCOUNTER — Telehealth: Payer: Self-pay | Admitting: Hematology

## 2016-07-23 NOTE — Telephone Encounter (Signed)
Faxed records to [facility] [phone]

## 2016-07-26 ENCOUNTER — Other Ambulatory Visit (HOSPITAL_BASED_OUTPATIENT_CLINIC_OR_DEPARTMENT_OTHER): Payer: Medicaid Other

## 2016-07-26 ENCOUNTER — Ambulatory Visit (HOSPITAL_BASED_OUTPATIENT_CLINIC_OR_DEPARTMENT_OTHER): Payer: Medicaid Other

## 2016-07-26 ENCOUNTER — Ambulatory Visit: Payer: Medicaid Other

## 2016-07-26 ENCOUNTER — Other Ambulatory Visit: Payer: Self-pay | Admitting: *Deleted

## 2016-07-26 ENCOUNTER — Ambulatory Visit (HOSPITAL_BASED_OUTPATIENT_CLINIC_OR_DEPARTMENT_OTHER): Payer: Medicaid Other | Admitting: Hematology

## 2016-07-26 VITALS — BP 135/83 | HR 84 | Temp 98.5°F | Resp 18 | Wt 250.2 lb

## 2016-07-26 DIAGNOSIS — E1140 Type 2 diabetes mellitus with diabetic neuropathy, unspecified: Secondary | ICD-10-CM | POA: Diagnosis not present

## 2016-07-26 DIAGNOSIS — C3491 Malignant neoplasm of unspecified part of right bronchus or lung: Secondary | ICD-10-CM | POA: Diagnosis present

## 2016-07-26 DIAGNOSIS — C3492 Malignant neoplasm of unspecified part of left bronchus or lung: Secondary | ICD-10-CM

## 2016-07-26 DIAGNOSIS — E279 Disorder of adrenal gland, unspecified: Secondary | ICD-10-CM | POA: Diagnosis not present

## 2016-07-26 DIAGNOSIS — R000 Tachycardia, unspecified: Secondary | ICD-10-CM | POA: Diagnosis not present

## 2016-07-26 DIAGNOSIS — C770 Secondary and unspecified malignant neoplasm of lymph nodes of head, face and neck: Secondary | ICD-10-CM | POA: Diagnosis not present

## 2016-07-26 DIAGNOSIS — Z95828 Presence of other vascular implants and grafts: Secondary | ICD-10-CM

## 2016-07-26 DIAGNOSIS — G893 Neoplasm related pain (acute) (chronic): Secondary | ICD-10-CM

## 2016-07-26 DIAGNOSIS — I10 Essential (primary) hypertension: Secondary | ICD-10-CM | POA: Diagnosis not present

## 2016-07-26 DIAGNOSIS — D6481 Anemia due to antineoplastic chemotherapy: Secondary | ICD-10-CM | POA: Diagnosis not present

## 2016-07-26 DIAGNOSIS — G620 Drug-induced polyneuropathy: Secondary | ICD-10-CM | POA: Diagnosis not present

## 2016-07-26 DIAGNOSIS — J700 Acute pulmonary manifestations due to radiation: Secondary | ICD-10-CM

## 2016-07-26 DIAGNOSIS — E0800 Diabetes mellitus due to underlying condition with hyperosmolarity without nonketotic hyperglycemic-hyperosmolar coma (NKHHC): Secondary | ICD-10-CM

## 2016-07-26 DIAGNOSIS — Z452 Encounter for adjustment and management of vascular access device: Secondary | ICD-10-CM

## 2016-07-26 DIAGNOSIS — Z5112 Encounter for antineoplastic immunotherapy: Secondary | ICD-10-CM | POA: Diagnosis present

## 2016-07-26 LAB — CBC WITH DIFFERENTIAL/PLATELET
BASO%: 0.4 % (ref 0.0–2.0)
Basophils Absolute: 0 10*3/uL (ref 0.0–0.1)
EOS ABS: 0.3 10*3/uL (ref 0.0–0.5)
EOS%: 4.2 % (ref 0.0–7.0)
HEMATOCRIT: 31.6 % — AB (ref 34.8–46.6)
HGB: 10.4 g/dL — ABNORMAL LOW (ref 11.6–15.9)
LYMPH#: 1.2 10*3/uL (ref 0.9–3.3)
LYMPH%: 19.9 % (ref 14.0–49.7)
MCH: 26.6 pg (ref 25.1–34.0)
MCHC: 32.8 g/dL (ref 31.5–36.0)
MCV: 81.2 fL (ref 79.5–101.0)
MONO#: 0.4 10*3/uL (ref 0.1–0.9)
MONO%: 6.9 % (ref 0.0–14.0)
NEUT%: 68.6 % (ref 38.4–76.8)
NEUTROS ABS: 4.1 10*3/uL (ref 1.5–6.5)
Platelets: 315 10*3/uL (ref 145–400)
RBC: 3.89 10*6/uL (ref 3.70–5.45)
RDW: 14.9 % — ABNORMAL HIGH (ref 11.2–14.5)
WBC: 6 10*3/uL (ref 3.9–10.3)

## 2016-07-26 LAB — COMPREHENSIVE METABOLIC PANEL
ALBUMIN: 2.9 g/dL — AB (ref 3.5–5.0)
ALT: 10 U/L (ref 0–55)
AST: 15 U/L (ref 5–34)
Alkaline Phosphatase: 88 U/L (ref 40–150)
Anion Gap: 10 mEq/L (ref 3–11)
BILIRUBIN TOTAL: 0.26 mg/dL (ref 0.20–1.20)
BUN: 6.4 mg/dL — AB (ref 7.0–26.0)
CALCIUM: 9.2 mg/dL (ref 8.4–10.4)
CO2: 27 mEq/L (ref 22–29)
CREATININE: 0.7 mg/dL (ref 0.6–1.1)
Chloride: 105 mEq/L (ref 98–109)
EGFR: >90 mL/min/{1.73_m2} (ref >90)
GLUCOSE: 179 mg/dL — AB (ref 70–140)
Potassium: 3.7 mEq/L (ref 3.5–5.1)
SODIUM: 142 meq/L (ref 136–145)
TOTAL PROTEIN: 7.3 g/dL (ref 6.4–8.3)

## 2016-07-26 MED ORDER — DIPHENHYDRAMINE HCL 25 MG PO TABS
25.0000 mg | ORAL_TABLET | Freq: Once | ORAL | Status: AC
  Administered 2016-07-26: 25 mg via ORAL
  Filled 2016-07-26: qty 1

## 2016-07-26 MED ORDER — HEPARIN SOD (PORK) LOCK FLUSH 100 UNIT/ML IV SOLN
250.0000 [IU] | Freq: Once | INTRAVENOUS | Status: AC | PRN
Start: 2016-07-26 — End: 2016-07-26
  Administered 2016-07-26: 250 [IU]
  Filled 2016-07-26: qty 5

## 2016-07-26 MED ORDER — HEPARIN SOD (PORK) LOCK FLUSH 100 UNIT/ML IV SOLN
500.0000 [IU] | Freq: Once | INTRAVENOUS | Status: AC | PRN
  Administered 2016-07-26: 500 [IU] via INTRAVENOUS
  Filled 2016-07-26: qty 5

## 2016-07-26 MED ORDER — DIPHENHYDRAMINE HCL 25 MG PO CAPS
ORAL_CAPSULE | ORAL | Status: AC
  Filled 2016-07-26: qty 1

## 2016-07-26 MED ORDER — ACETAMINOPHEN 325 MG PO TABS
650.0000 mg | ORAL_TABLET | Freq: Once | ORAL | Status: AC
Start: 2016-07-26 — End: 2016-07-26
  Administered 2016-07-26: 650 mg via ORAL

## 2016-07-26 MED ORDER — ACETAMINOPHEN 325 MG PO TABS
ORAL_TABLET | ORAL | Status: AC
  Filled 2016-07-26: qty 2

## 2016-07-26 MED ORDER — SODIUM CHLORIDE 0.9 % IV SOLN
Freq: Once | INTRAVENOUS | Status: AC
  Administered 2016-07-26: 15:00:00 via INTRAVENOUS

## 2016-07-26 MED ORDER — SODIUM CHLORIDE 0.9 % IV SOLN
9.5000 mg/kg | Freq: Once | INTRAVENOUS | Status: AC
  Administered 2016-07-26: 1120 mg via INTRAVENOUS
  Filled 2016-07-26: qty 20

## 2016-07-26 MED ORDER — DILTIAZEM HCL ER COATED BEADS 120 MG PO CP24: 120.0000 mg | ORAL_CAPSULE | Freq: Every day | ORAL | 0 refills | Status: DC

## 2016-07-26 MED ORDER — SODIUM CHLORIDE 0.9% FLUSH
10.0000 mL | INTRAVENOUS | Status: DC | PRN
  Administered 2016-07-26: 10 mL
  Filled 2016-07-26: qty 10

## 2016-07-26 MED ORDER — ASPIRIN EC 81 MG PO TBEC: 81.0000 mg | DELAYED_RELEASE_TABLET | Freq: Every day | ORAL | 2 refills | Status: DC

## 2016-07-26 MED ORDER — SODIUM CHLORIDE 0.9 % IJ SOLN
10.0000 mL | INTRAMUSCULAR | Status: DC | PRN
  Administered 2016-07-26: 10 mL via INTRAVENOUS
  Filled 2016-07-26: qty 10

## 2016-07-26 NOTE — Patient Instructions (Signed)
[facility] Discharge Instructions for Patients Receiving Chemotherapy  Today you received the following chemotherapy agents:  Imfinzi.  To help prevent nausea and vomiting after your treatment, we encourage you to take your nausea medication as directed.   If you develop nausea and vomiting that is not controlled by your nausea medication, call the clinic.   BELOW ARE SYMPTOMS THAT SHOULD BE REPORTED IMMEDIATELY:  *FEVER GREATER THAN 100.5 F  *CHILLS WITH OR WITHOUT FEVER  NAUSEA AND VOMITING THAT IS NOT CONTROLLED WITH YOUR NAUSEA MEDICATION  *UNUSUAL SHORTNESS OF BREATH  *UNUSUAL BRUISING OR BLEEDING  TENDERNESS IN MOUTH AND THROAT WITH OR WITHOUT PRESENCE OF ULCERS  *URINARY PROBLEMS  *BOWEL PROBLEMS  UNUSUAL RASH Items with * indicate a potential emergency and should be followed up as soon as possible.  Feel free to call the clinic you have any questions or concerns. The clinic phone number is [phone].  Please show the CHEMO ALERT CARD at check-in to the Emergency Department and triage nurse.   

## 2016-07-26 NOTE — Patient Instructions (Signed)

## 2016-07-26 NOTE — Progress Notes (Signed)
Marilyn [name]    HEMATOLOGY/ONCOLOGY CLINIC NOTE  Date of Service: .07/26/2016  Patient Care Team: Pcp Not In System as PCP - General  CHIEF COMPLAINTS/PURPOSE OF CONSULTATION:  f/u Lung Adenocarcinoma.  Diagnosis Stage IIIB Lung Adenocarcinoma  Current treatment: -Starting Maintain Durvalumab from today 05/24/2016  PreviousTreatment -Concurrent Chemo-radiation with Carboplatin/Taxol. Completed RT on 02/13/2016 and has then completed 2 cycles of carboplatin + taxol.  HISTORY OF PRESENTING ILLNESS: Plz see my previous note for details on initial presentation.  INTERVAL HISTORY  Patient is here for her scheduled followup for prior to 4th dose of maintenance Durvalumab for her stage IIIB lung adenocarcinoma.  She presented for shortness of breath and had a CT of the chest on 07/05/2016 showed some right-sided infiltrates thought to be radiation pneumonitis. Also noted to have a moderate right-sided pleural effusion. Was treated empirically with levofloxacin for possible pneumonia. Chest x-ray done on 07/16/2016 showed some persistent right perihilar changes and patchy areas in the right middle and lower lung which are stable with a stable/improved right-sided effusion.  Patient notes no fevers or chills. Notes that her breathing has not been better over the last several weeks. Notes that her diabetes is much better controlled and she is eating more mindfully. Was in her shortness of breath is better. No chest pain. No cough. Notes that she is working with physical therapy 1 day a week and this has helped her performance status.  MEDICAL HISTORY:  Past Medical History:  Diagnosis Date  . Asthma   . Diabetes mellitus   . Hypercholesteremia   . Hypertension   . Lung cancer ([place])   . Neuropathy   . Obese     SURGICAL HISTORY: Past Surgical History:  Procedure Laterality Date  . CESAREAN SECTION    . IR GENERIC HISTORICAL  01/08/2016   IR US GUIDE VASC ACCESS RIGHT 01/08/2016 [name],  DO [facility]  . IR GENERIC HISTORICAL  01/08/2016   IR FLUORO GUIDE CV LINE RIGHT 01/08/2016 [name], DO [facility]  . TUBAL LIGATION    . US guided core needle biopsy     of right lower neck/supraclavicular lymph nodes    SOCIAL HISTORY: Social History   Social History  . Marital status: Divorced    Spouse name: N/A  . Number of children: N/A  . Years of education: N/A   Occupational History  . Not on file.   Social History Main Topics  . Smoking status: Never Smoker  . Smokeless tobacco: Never Used  . Alcohol use No  . Drug use: No  . Sexual activity: Not Currently   Other Topics Concern  . Not on file   Social History Narrative   No bird or mold exposure. No recent travel.    FAMILY HISTORY: Family History  Problem Relation Age of Onset  . Cancer Neg Hx   . Rheumatologic disease Neg Hx     ALLERGIES:  has No Known Allergies.  MEDICATIONS:  Current Outpatient Prescriptions  Medication Sig Dispense Refill  . aspirin EC 81 MG tablet Take 1 tablet (81 mg total) by mouth daily. 60 tablet 2  . diltiazem (CARDIZEM CD) 120 MG 24 hr capsule Take 1 capsule (120 mg total) by mouth daily. Start taking this instead of your (Metoprolol/Toprol XL). 30 capsule 0  . dronabinol (MARINOL) 2.5 MG capsule Take 1 capsule (2.5 mg total) by mouth 2 (two) times daily before a meal. (Patient not taking: Reported on 07/03/2016) 60 capsule 0  .  DULoxetine (CYMBALTA) 30 MG capsule Take 1 capsule (30 mg total) by mouth daily. 30 capsule 1  . fluconazole (DIFLUCAN) 100 MG tablet Take 1 tablet (100 mg total) by mouth daily. (Patient not taking: Reported on 07/03/2016) 14 tablet 1  . Fluocinonide 0.1 % CREA Apply 1 application topically daily.     Marilyn [name] HYDROcodone-acetaminophen (HYCET) 7.5-325 mg/15 ml solution Take 15 mLs by mouth 4 (four) times daily as needed for moderate pain. (Patient not taking: Reported on 07/03/2016) 473 mL 0  . insulin aspart (NOVOLOG) 100 UNIT/ML FlexPen As per  sliding scale instructions (Patient taking differently: Inject 0-20 Units into the skin 3 (three) times daily as needed for high blood sugar. As per sliding scale instructions) 15 mL 2  . Insulin Degludec (TRESIBA FLEXTOUCH) 200 UNIT/ML SOPN Inject 100 Units into the skin daily before breakfast.     . levofloxacin (LEVAQUIN) 750 MG tablet Take 1 tablet (750 mg total) by mouth daily. 5 tablet 0  . lidocaine-prilocaine (EMLA) cream Apply to affected area once (Patient not taking: Reported on 07/03/2016) 30 g 3  . loratadine (CLARITIN) 10 MG tablet Take 10 mg by mouth daily.    Marilyn [name] oxyCODONE-acetaminophen (PERCOCET/ROXICET) 5-325 MG tablet Take 1-2 tablets by mouth every 4 (four) hours as needed for severe pain. 20 tablet 0  . polyethylene glycol (MIRALAX) packet Take 17 g by mouth daily. 30 each 1  . senna-docusate (SENNA S) 8.6-50 MG tablet Take 2 tablets by mouth 2 (two) times daily. May reduce to 2 tab po HS as needed once regular BM estabished 60 tablet 1  . Wound Dressings (SONAFINE) Apply 1 application topically 2 (two) times daily.     No current facility-administered medications for this visit.    Facility-Administered Medications Ordered in Other Visits  Medication Dose Route Frequency Provider Last Rate Last Dose  . sodium chloride 0.9 % injection 10 mL  10 mL Intravenous PRN [name], MD   10 mL at 05/24/16 1252    REVIEW OF SYSTEMS:    10 Point review of Systems was done is negative except as noted above.  PHYSICAL EXAMINATION: ECOG PERFORMANCE STATUS: 1 - Symptomatic but completely ambulatory  . Vitals:   07/26/16 1414  BP: 135/83  Pulse: 84  Resp: 18  Temp: 98.5 F (36.9 C)   Filed Weights   07/26/16 1414  Weight: 250 lb 4 oz (113.5 kg)   .Body mass index is 36.96 kg/m.  GENERAL:alert, in no acute distress and comfortable SKIN: radiation related skin injury over neck - healing EYES: normal, conjunctiva are pink and non-injected, sclera  clear OROPHARYNX:oral thrush noted NECK: supple, no JVD, thyroid normal size, non-tender, without nodularity LYMPH:  no palpable lymphadenopathy in the cervical, axillary or inguinal.             LUNGS: clear to auscultation with normal respiratory effort. Distant breath sounds  HEART: regular rate & rhythm,  no murmurs and no lower extremity edema ABDOMEN: abdomen obese, soft, non-tender, normoactive bowel sounds  Musculoskeletal: no cyanosis of digits and no clubbing  PSYCH: alert & oriented x 3 with fluent speech NEURO: no focal motor/sensory deficits  LABORATORY DATA: .  Marilyn [name] CBC Latest Ref Rng & Units 07/26/2016 07/05/2016 07/03/2016  WBC 3.9 - 10.3 10e3/uL 6.0 5.0 7.2  Hemoglobin 11.6 - 15.9 g/dL 10.4(L) 10.0(L) 10.0(L)  Hematocrit 34.8 - 46.6 % 31.6(L) 31.5(L) 30.3(L)  Platelets 145 - 400 10e3/uL 315 278 306   . CMP Latest Ref Rng &  Units 07/26/2016 07/05/2016 07/03/2016  Glucose 70 - 140 mg/dl 179(H) 402(H) 330(H)  BUN 7.0 - 26.0 mg/dL 6.4(L) 7.9 9  Creatinine 0.6 - 1.1 mg/dL 0.7 0.8 0.54  Sodium 136 - 145 mEq/L 142 138 137  Potassium 3.5 - 5.1 mEq/L 3.7 3.8 3.8  Chloride 101 - 111 mmol/L - - 102  CO2 22 - 29 mEq/L _0 Calcium 8.4 - 10.4 mg/dL 9.2 9.3 8.9  Total Protein 6.4 - 8.3 g/dL 7.3 7.2 -  Total Bilirubin 0.20 - 1.20 mg/dL 0.26 0.31 -  Alkaline Phos 40 - 150 U/L 88 107 -  AST 5 - 34 U/L 15 11 -  ALT 0 - 55 U/L 10 12 -                RADIOGRAPHIC STUDIES: I have personally reviewed the radiological images as listed and agreed with the findings in the report. PET/CT IMPRESSION: 1. Hypermetabolic 1.7 cm peripheral right hilar nodule. Given the peripheral location of this nodule within the right hilum, this could potentially represent a centrally located primary bronchogenic malignancy. 2. Hypermetabolic bulky ipsilateral paratracheal, bilateral prevascular mediastinal and bilateral scalene nodal metastases. 3. Hypermetabolic right lower neck nodal  metastases involving right neck nodal levels III, IV and V. 4. No hypermetabolic metastatic disease in the abdomen, pelvis or skeleton. 5. Aortic atherosclerosis.   Electronically Signed   By: Ilona Sorrel M.D.   On: 01/12/2016 09:42    CTA chest 04/16/2016  IMPRESSION: 1. No evidence of a pulmonary embolism. 2. New, 16 mm, focal area of ground-glass opacity in the right upper lobe. This may reflect metastatic disease. It may be inflammatory/infectious in origin. 3. There is a less well-defined but otherwise similar appearing area of opacity in the superior segment right lower lobe, which has increased from the prior chest CT. This also may reflect metastatic disease or be infectious or inflammatory in etiology. 4. There has been interval improvement in the lung carcinoma. The bulky right paratracheal mass has decreased in size. The supraclavicular adenopathy has significantly decreased with no residual enlarged lymph nodes.   Electronically Signed   By: Lajean Manes M.D.   On: 04/16/2016 12:02  CTA chest 05/14/2016  IMPRESSION: 1. No filling defect is identified in the pulmonary arterial tree to suggest pulmonary embolus. 2. Primarily dependent and sharply defined hazy airspace opacities in the right upper lobe and right lower lobe, with some minimal involvement of the left upper lobe and posterior portion of the right middle lobe. At the margins these mostly conform to secondary pulmonary lobules. Appearance suspicious for multilobar pneumonia, aspiration pneumonia might have a similar appearance. Endobronchial spread of tumor might conceivably have a similar appearance; the right paratracheal mass is not appreciably changed in size or morphology and there is no obvious tumor extension into the adjacent trachea or right mainstem bronchus, and no airway occlusion is observed.   Electronically Signed   By: Van Clines M.D.   On: 05/14/2016  14:47   ASSESSMENT & PLAN:   51 year old African-American female with history of hypertension, diabetes, asthma, dysuria mass and morbid obesity with   #1 Lung Adenocarcinoma Rt sided atleast Stage IIIB with large right paratracheal mass with mediastinal adenopathy that appears to have grown significantly over the last 6-7 months and rt supraclavicular LN +.   Noted to have a small mass in the left adrenal on CT but PET/CT neg for metastatic disease. Patient has been a lifelong nonsmoker  MRI of  the brain was negative for any metastatic disease.  High PDL1 expression (90%) on [facility] One Neg for EGFR, ALK, ROS-1 and BRAF mutations.  Patient has completed her planned definitive chemoradiation with carbo Taxol on 02/13/2016. No prohibitive toxicities other than some grade 1 skin desquamation and some grade 1-2 radiation esophagitis. She has subsequently received 2 out of 2 planned dose of carboplatin + Taxol.  #2 resolving pneumonia/radiation pneumonitis . #3 resolving fatigue from chemotherapy -working with PT once weekly and encourage to walk as much as possible. #4 normocytic anemia related to chemotherapy -resolving #5 Left chest wall pain due to costochondritis - mx with pain medications. CTA x 2 neg for PE Plan -labs stable. -patient appropriate to proceed with her next cycle of treatment with maintenance Durvalumab. -counseled regarding importance of daily exercise/activity -continue outpatient physical therapy -DM2 better controlled --recommended maintenance of good po hydration and food intake -shall continue Durvalumab maintenance. -rpt CT chest/abd/pelvis in 2 months  #6 Sinus tachycardia - on Cardizem CR , no PE on CTA x 2 Plan -HR control improved. HR today 84/min -continue Cardizem CR 120 mg po daily -maintain good po hydration.   #3 neoplasm related pain is currently controlled without significant medications. Clinically likely has sleep apnea . Would need  to be careful with sedative medications . Plan -recommend sleep study with PCP  #3 hypertension   #4 diabetes -uncontrolled #5 dyslipidemia #6 asthma Plan - insulin SS for uncontrolled hyperglycemia in the setting of steroid use with chemotherapy. 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 The patient knows to call the clinic with any problems, questions or concerns.  I spent 20 minutes counseling the patient face to face. The total time spent in the appointment was 30 minutes and more than 50% was on counseling and direct patient cares.    GSullivan LoneMD MPioneerAAHIVMS SBroward Health NorthCGreen Valley Surgery CenterHematology/Oncology Physician CPam [facility]ty [facility] (Office):       [phone](Work cell):  [phone](Fax):           [phone]

## 2016-07-27 ENCOUNTER — Telehealth: Payer: Self-pay | Admitting: Hematology

## 2016-07-27 NOTE — Telephone Encounter (Signed)
Scheduled appt per 4/23 los. - unable to reach patient - left message with appt date and time . Sent reminder letter

## 2016-08-02 ENCOUNTER — Ambulatory Visit (HOSPITAL_BASED_OUTPATIENT_CLINIC_OR_DEPARTMENT_OTHER): Payer: Medicaid Other

## 2016-08-02 DIAGNOSIS — Z452 Encounter for adjustment and management of vascular access device: Secondary | ICD-10-CM | POA: Diagnosis not present

## 2016-08-02 DIAGNOSIS — C770 Secondary and unspecified malignant neoplasm of lymph nodes of head, face and neck: Secondary | ICD-10-CM

## 2016-08-02 DIAGNOSIS — C3491 Malignant neoplasm of unspecified part of right bronchus or lung: Secondary | ICD-10-CM

## 2016-08-02 MED ORDER — HEPARIN SOD (PORK) LOCK FLUSH 100 UNIT/ML IV SOLN
250.0000 [IU] | Freq: Once | INTRAVENOUS | Status: AC
  Administered 2016-08-02: 250 [IU]
  Filled 2016-08-02: qty 5

## 2016-08-02 MED ORDER — SODIUM CHLORIDE 0.9% FLUSH
10.0000 mL | Freq: Once | INTRAVENOUS | Status: AC
  Administered 2016-08-02: 10 mL
  Filled 2016-08-02: qty 10

## 2016-08-03 ENCOUNTER — Other Ambulatory Visit: Payer: Self-pay | Admitting: *Deleted

## 2016-08-03 ENCOUNTER — Telehealth: Payer: Self-pay | Admitting: Hematology

## 2016-08-03 NOTE — Telephone Encounter (Signed)
Confirmed flush appts with pt per LOS

## 2016-08-04 ENCOUNTER — Ambulatory Visit (HOSPITAL_BASED_OUTPATIENT_CLINIC_OR_DEPARTMENT_OTHER): Payer: Medicaid Other

## 2016-08-04 DIAGNOSIS — C770 Secondary and unspecified malignant neoplasm of lymph nodes of head, face and neck: Secondary | ICD-10-CM | POA: Diagnosis not present

## 2016-08-04 DIAGNOSIS — C3491 Malignant neoplasm of unspecified part of right bronchus or lung: Secondary | ICD-10-CM

## 2016-08-04 DIAGNOSIS — Z452 Encounter for adjustment and management of vascular access device: Secondary | ICD-10-CM

## 2016-08-04 DIAGNOSIS — Z95828 Presence of other vascular implants and grafts: Secondary | ICD-10-CM

## 2016-08-04 MED ORDER — SODIUM CHLORIDE 0.9% FLUSH
10.0000 mL | Freq: Once | INTRAVENOUS | Status: AC
  Administered 2016-08-04: 10 mL
  Filled 2016-08-04: qty 10

## 2016-08-04 MED ORDER — HEPARIN SOD (PORK) LOCK FLUSH 100 UNIT/ML IV SOLN
250.0000 [IU] | Freq: Once | INTRAVENOUS | Status: AC
  Administered 2016-08-04: 250 [IU]
  Filled 2016-08-04: qty 5

## 2016-08-06 ENCOUNTER — Ambulatory Visit (HOSPITAL_BASED_OUTPATIENT_CLINIC_OR_DEPARTMENT_OTHER): Payer: Medicaid Other

## 2016-08-06 DIAGNOSIS — Z452 Encounter for adjustment and management of vascular access device: Secondary | ICD-10-CM | POA: Diagnosis present

## 2016-08-06 DIAGNOSIS — Z95828 Presence of other vascular implants and grafts: Secondary | ICD-10-CM

## 2016-08-06 DIAGNOSIS — C770 Secondary and unspecified malignant neoplasm of lymph nodes of head, face and neck: Secondary | ICD-10-CM

## 2016-08-06 DIAGNOSIS — C3491 Malignant neoplasm of unspecified part of right bronchus or lung: Secondary | ICD-10-CM | POA: Diagnosis not present

## 2016-08-06 MED ORDER — SODIUM CHLORIDE 0.9% FLUSH
10.0000 mL | Freq: Once | INTRAVENOUS | Status: AC
  Administered 2016-08-06: 10 mL
  Filled 2016-08-06: qty 10

## 2016-08-06 MED ORDER — HEPARIN SOD (PORK) LOCK FLUSH 100 UNIT/ML IV SOLN
250.0000 [IU] | Freq: Once | INTRAVENOUS | Status: AC
  Administered 2016-08-06: 250 [IU]
  Filled 2016-08-06: qty 5

## 2016-08-09 ENCOUNTER — Ambulatory Visit: Payer: Medicaid Other

## 2016-08-09 ENCOUNTER — Other Ambulatory Visit (HOSPITAL_BASED_OUTPATIENT_CLINIC_OR_DEPARTMENT_OTHER): Payer: Medicaid Other

## 2016-08-09 ENCOUNTER — Ambulatory Visit (HOSPITAL_BASED_OUTPATIENT_CLINIC_OR_DEPARTMENT_OTHER): Payer: Medicaid Other

## 2016-08-09 ENCOUNTER — Other Ambulatory Visit: Payer: Self-pay | Admitting: *Deleted

## 2016-08-09 DIAGNOSIS — D6481 Anemia due to antineoplastic chemotherapy: Secondary | ICD-10-CM | POA: Diagnosis not present

## 2016-08-09 DIAGNOSIS — Z79899 Other long term (current) drug therapy: Secondary | ICD-10-CM

## 2016-08-09 DIAGNOSIS — Z95828 Presence of other vascular implants and grafts: Secondary | ICD-10-CM

## 2016-08-09 DIAGNOSIS — Z5111 Encounter for antineoplastic chemotherapy: Secondary | ICD-10-CM

## 2016-08-09 DIAGNOSIS — C3492 Malignant neoplasm of unspecified part of left bronchus or lung: Secondary | ICD-10-CM

## 2016-08-09 DIAGNOSIS — C3491 Malignant neoplasm of unspecified part of right bronchus or lung: Secondary | ICD-10-CM

## 2016-08-09 DIAGNOSIS — R7989 Other specified abnormal findings of blood chemistry: Secondary | ICD-10-CM

## 2016-08-09 DIAGNOSIS — C770 Secondary and unspecified malignant neoplasm of lymph nodes of head, face and neck: Secondary | ICD-10-CM | POA: Diagnosis not present

## 2016-08-09 DIAGNOSIS — Z452 Encounter for adjustment and management of vascular access device: Secondary | ICD-10-CM

## 2016-08-09 LAB — COMPREHENSIVE METABOLIC PANEL
ALBUMIN: 3 g/dL — AB (ref 3.5–5.0)
ALK PHOS: 83 U/L (ref 40–150)
ALT: 11 U/L (ref 0–55)
AST: 11 U/L (ref 5–34)
Anion Gap: 8 mEq/L (ref 3–11)
BILIRUBIN TOTAL: 0.29 mg/dL (ref 0.20–1.20)
BUN: 8.8 mg/dL (ref 7.0–26.0)
CALCIUM: 9 mg/dL (ref 8.4–10.4)
CO2: 28 mEq/L (ref 22–29)
CREATININE: 0.7 mg/dL (ref 0.6–1.1)
Chloride: 103 mEq/L (ref 98–109)
Glucose: 227 mg/dl — ABNORMAL HIGH (ref 70–140)
Potassium: 3.8 mEq/L (ref 3.5–5.1)
Sodium: 139 mEq/L (ref 136–145)
TOTAL PROTEIN: 7.3 g/dL (ref 6.4–8.3)

## 2016-08-09 LAB — CBC & DIFF AND RETIC
BASO%: 0.2 % (ref 0.0–2.0)
Basophils Absolute: 0 10*3/uL (ref 0.0–0.1)
EOS%: 5.3 % (ref 0.0–7.0)
Eosinophils Absolute: 0.3 10*3/uL (ref 0.0–0.5)
HCT: 31.3 % — ABNORMAL LOW (ref 34.8–46.6)
HGB: 10 g/dL — ABNORMAL LOW (ref 11.6–15.9)
Immature Retic Fract: 6.5 % (ref 1.60–10.00)
LYMPH#: 1.2 10*3/uL (ref 0.9–3.3)
LYMPH%: 21.8 % (ref 14.0–49.7)
MCH: 26.5 pg (ref 25.1–34.0)
MCHC: 31.9 g/dL (ref 31.5–36.0)
MCV: 82.8 fL (ref 79.5–101.0)
MONO#: 0.3 10*3/uL (ref 0.1–0.9)
MONO%: 5.4 % (ref 0.0–14.0)
NEUT%: 67.3 % (ref 38.4–76.8)
NEUTROS ABS: 3.8 10*3/uL (ref 1.5–6.5)
PLATELETS: 277 10*3/uL (ref 145–400)
RBC: 3.78 10*6/uL (ref 3.70–5.45)
RDW: 14.2 % (ref 11.2–14.5)
RETIC CT ABS: 61.24 10*3/uL (ref 33.70–90.70)
Retic %: 1.62 % (ref 0.70–2.10)
WBC: 5.7 10*3/uL (ref 3.9–10.3)

## 2016-08-09 LAB — FERRITIN: FERRITIN: 154 ng/mL (ref 9–269)

## 2016-08-09 LAB — IRON AND TIBC
%SAT: 20 % — ABNORMAL LOW (ref 21–57)
IRON: 56 ug/dL (ref 41–142)
TIBC: 280 ug/dL (ref 236–444)
UIBC: 224 ug/dL (ref 120–384)

## 2016-08-09 LAB — TSH: TSH: 9.138 m[IU]/L — AB (ref 0.308–3.960)

## 2016-08-09 MED ORDER — HEPARIN SOD (PORK) LOCK FLUSH 100 UNIT/ML IV SOLN
250.0000 [IU] | Freq: Once | INTRAVENOUS | Status: DC | PRN
  Filled 2016-08-09: qty 5

## 2016-08-09 MED ORDER — HEPARIN SOD (PORK) LOCK FLUSH 100 UNIT/ML IV SOLN
250.0000 [IU] | Freq: Once | INTRAVENOUS | Status: AC
Start: 2016-08-09 — End: 2016-08-09
  Administered 2016-08-09: 250 [IU]
  Filled 2016-08-09: qty 5

## 2016-08-09 MED ORDER — SODIUM CHLORIDE 0.9 % IV SOLN
Freq: Once | INTRAVENOUS | Status: AC
  Administered 2016-08-09: 16:00:00 via INTRAVENOUS

## 2016-08-09 MED ORDER — SODIUM CHLORIDE 0.9% FLUSH
10.0000 mL | INTRAVENOUS | Status: DC | PRN
  Filled 2016-08-09: qty 10

## 2016-08-09 MED ORDER — DIPHENHYDRAMINE HCL 25 MG PO TABS
25.0000 mg | ORAL_TABLET | Freq: Once | ORAL | Status: AC
  Administered 2016-08-09: 25 mg via ORAL
  Filled 2016-08-09: qty 1

## 2016-08-09 MED ORDER — ACETAMINOPHEN 325 MG PO TABS
ORAL_TABLET | ORAL | Status: AC
  Filled 2016-08-09: qty 2

## 2016-08-09 MED ORDER — SODIUM CHLORIDE 0.9 % IV SOLN
9.6000 mg/kg | Freq: Once | INTRAVENOUS | Status: AC
  Administered 2016-08-09: 1120 mg via INTRAVENOUS
  Filled 2016-08-09: qty 20

## 2016-08-09 MED ORDER — ACETAMINOPHEN 325 MG PO TABS
650.0000 mg | ORAL_TABLET | Freq: Once | ORAL | Status: AC
  Administered 2016-08-09: 650 mg via ORAL

## 2016-08-09 MED ORDER — SODIUM CHLORIDE 0.9% FLUSH
10.0000 mL | Freq: Once | INTRAVENOUS | Status: AC
  Administered 2016-08-09: 10 mL
  Filled 2016-08-09: qty 10

## 2016-08-09 MED ORDER — DIPHENHYDRAMINE HCL 25 MG PO CAPS
ORAL_CAPSULE | ORAL | Status: AC
  Filled 2016-08-09: qty 1

## 2016-08-09 MED ORDER — HEPARIN SOD (PORK) LOCK FLUSH 100 UNIT/ML IV SOLN
500.0000 [IU] | Freq: Once | INTRAVENOUS | Status: DC | PRN
  Filled 2016-08-09: qty 5

## 2016-08-10 LAB — T4: Thyroxine (T4): 4 ug/dL — ABNORMAL LOW (ref 4.5–12.0)

## 2016-08-10 LAB — T3 UPTAKE
FREE THYROXINE INDEX: 0.6 — AB (ref 1.2–4.9)
T3 UPTAKE RATIO: 14 % — AB (ref 24–39)

## 2016-08-10 LAB — T4, FREE: FREE T4: 0.48 ng/dL — AB (ref 0.82–1.77)

## 2016-08-12 IMAGING — DX DG CHEST 2V
2 series · 2 of 2 positions shown · non-contrast
Comparison: 05/30/2015

CLINICAL DATA: Left-sided chest pain and shortness of Breath

EXAM:
CHEST  2 VIEW

[chest pa]
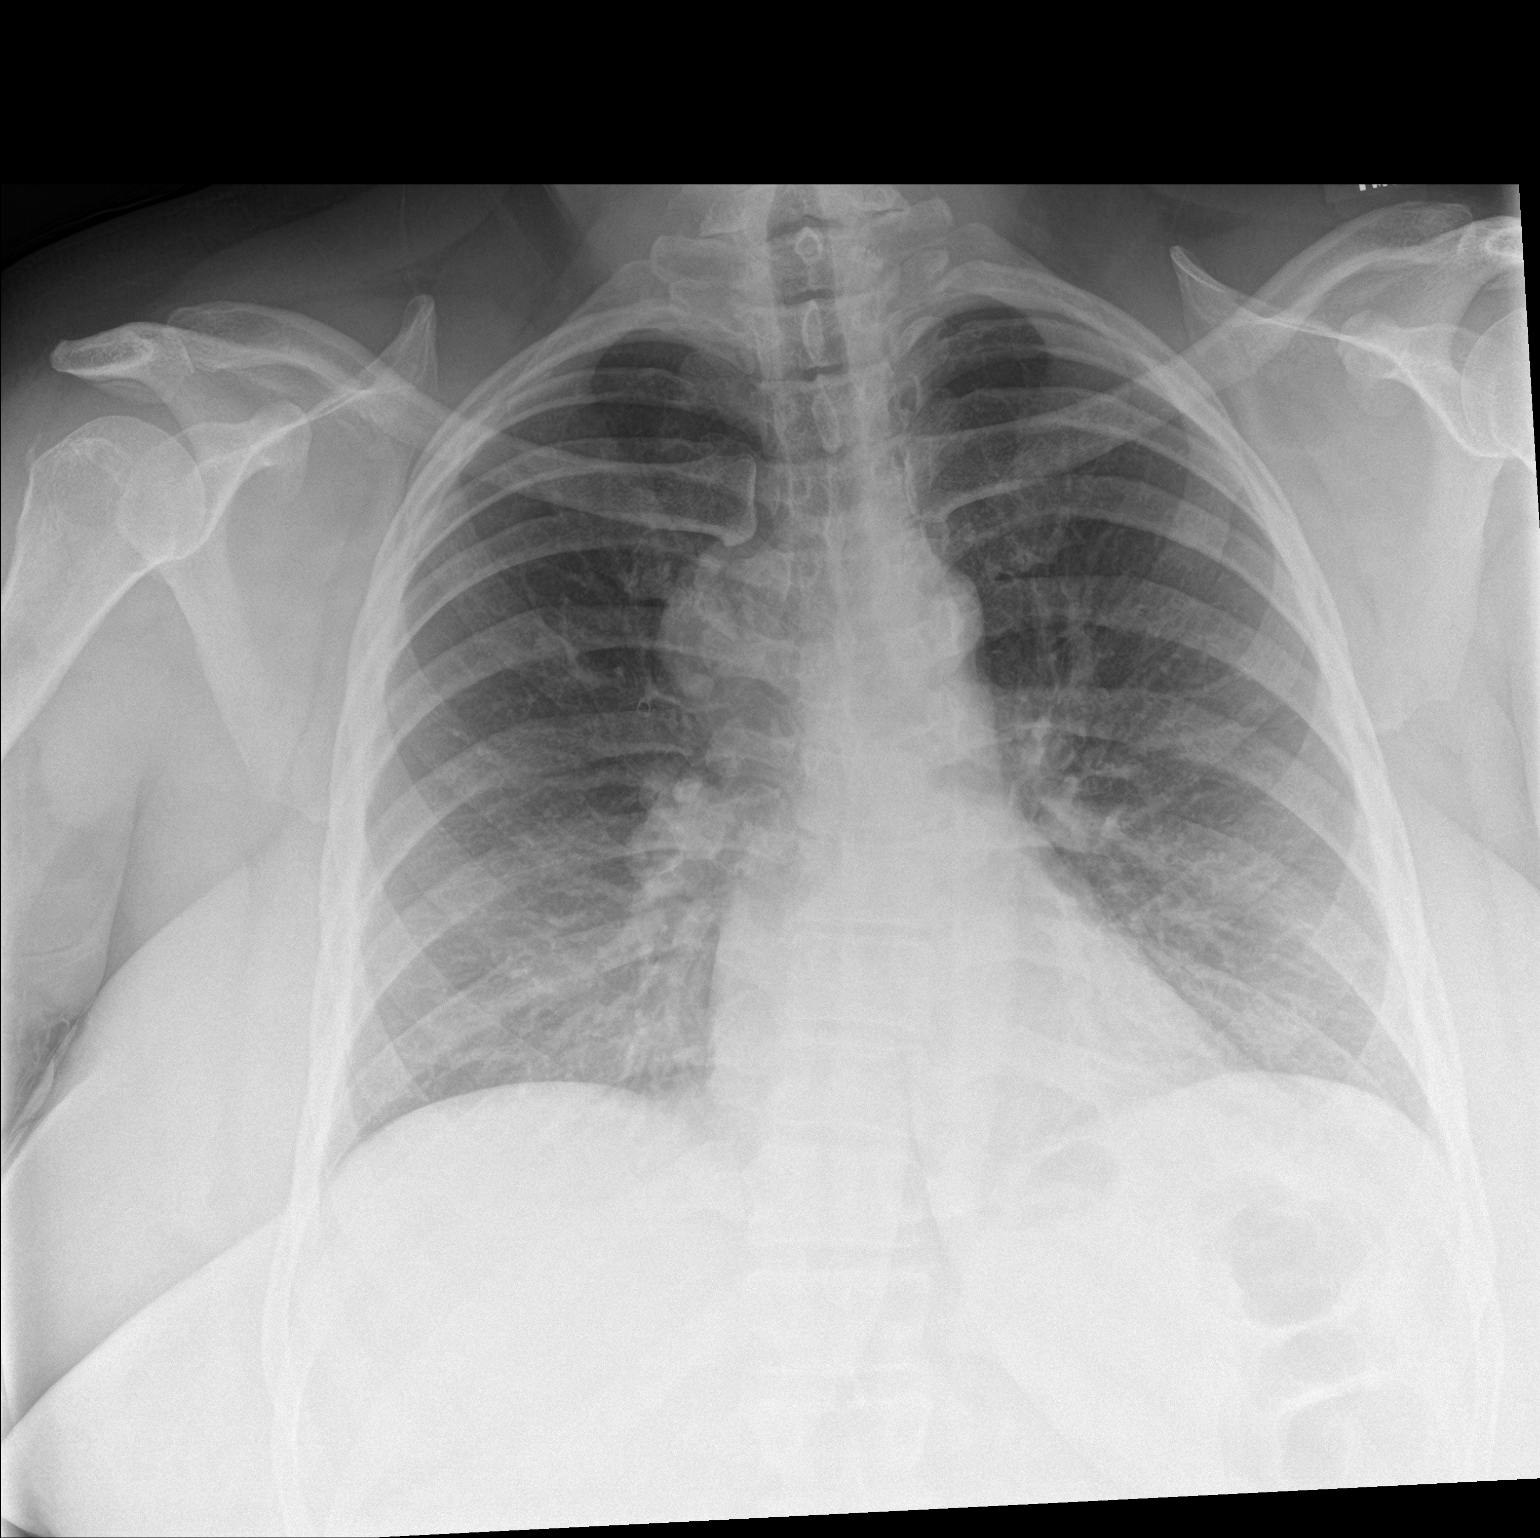

[chest lat]
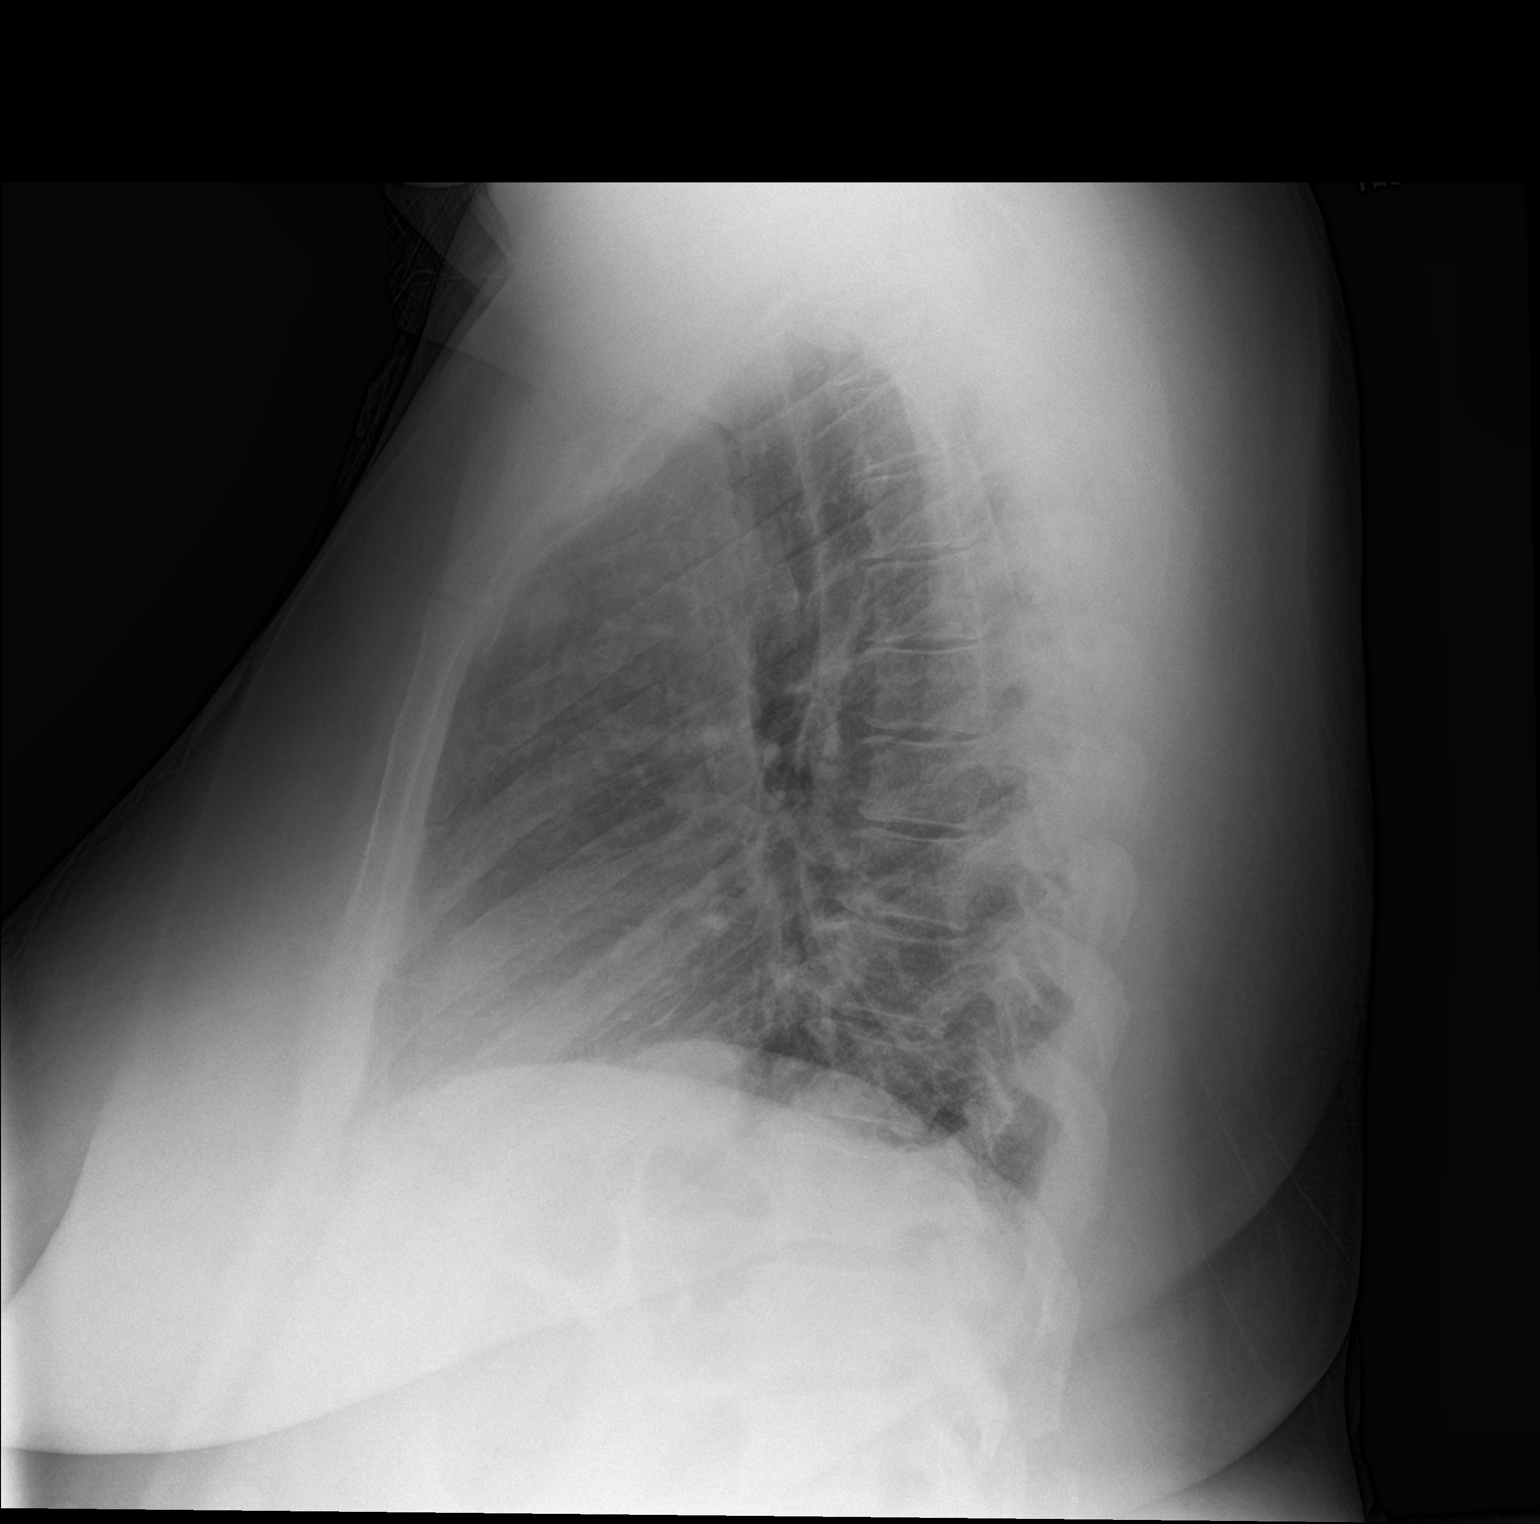

[2 of 2 positions shown; findings below may reference images not displayed]

FINDINGS: Cardiac shadow is within normal limits. Fullness in the right
peritracheal region is again identified similar to that seen on
prior CT examination consistent with mediastinal adenopathy. No
focal infiltrate or sizable effusion is seen. No bony abnormality is
noted.
IMPRESSION: Changes consistent with mediastinal adenopathy similar to that noted
on prior CT. This again likely represents sarcoidosis.

## 2016-08-13 ENCOUNTER — Ambulatory Visit (HOSPITAL_BASED_OUTPATIENT_CLINIC_OR_DEPARTMENT_OTHER): Payer: Medicaid Other

## 2016-08-13 DIAGNOSIS — C770 Secondary and unspecified malignant neoplasm of lymph nodes of head, face and neck: Secondary | ICD-10-CM | POA: Diagnosis not present

## 2016-08-13 DIAGNOSIS — C3491 Malignant neoplasm of unspecified part of right bronchus or lung: Secondary | ICD-10-CM | POA: Diagnosis not present

## 2016-08-13 DIAGNOSIS — Z95828 Presence of other vascular implants and grafts: Secondary | ICD-10-CM

## 2016-08-13 DIAGNOSIS — Z452 Encounter for adjustment and management of vascular access device: Secondary | ICD-10-CM

## 2016-08-13 MED ORDER — SODIUM CHLORIDE 0.9 % IJ SOLN
10.0000 mL | INTRAMUSCULAR | Status: DC | PRN
  Administered 2016-08-13: 10 mL via INTRAVENOUS
  Filled 2016-08-13: qty 10

## 2016-08-13 MED ORDER — HEPARIN SOD (PORK) LOCK FLUSH 100 UNIT/ML IV SOLN
500.0000 [IU] | Freq: Once | INTRAVENOUS | Status: AC | PRN
  Administered 2016-08-13: 500 [IU] via INTRAVENOUS
  Filled 2016-08-13: qty 5

## 2016-08-13 NOTE — Patient Instructions (Addendum)
PICC Home Guide °A peripherally inserted central catheter (PICC) is a long, thin, flexible tube that is inserted into a vein in the upper arm. It is a form of intravenous (IV) access. It is considered to be a "central" line because the tip of the PICC ends in a large vein in your chest. This large vein is called the superior vena cava (SVC). The PICC tip ends in the SVC because there is a lot of blood flow in the SVC. This allows medicines and IV fluids to be quickly distributed throughout the body. The PICC is inserted using a sterile technique by a specially trained nurse or physician. After the PICC is inserted, a chest X-ray exam is done to be sure it is in the correct place. °A PICC may be placed for different reasons, such as: °· To give medicines and liquid nutrition that can only be given through a central line. Examples are: °? Certain antibiotic treatments. °? Chemotherapy. °? Total parenteral nutrition (TPN). °· To take frequent blood samples. °· To give IV fluids and blood products. °· If there is difficulty placing a peripheral intravenous (PIV) catheter. ° °If taken care of properly, a PICC can remain in place for several months. A PICC can also allow a person to go home from the hospital early. Medicine and PICC care can be managed at home by a family member or home health care team. °What problems can happen when I have a PICC? °Problems with a PICC can occasionally occur. These may include the following: °· A blood clot (thrombus) forming in or at the tip of the PICC. This can cause the PICC to become clogged. A clot-dissolving medicine called tissue plasminogen activator (tPA) can be given through the PICC to help break up the clot. °· Inflammation of the vein (phlebitis) in which the PICC is placed. Signs of inflammation may include redness, pain at the insertion site, red streaks, or being able to feel a "cord" in the vein where the PICC is located. °· Infection in the PICC or at the insertion  site. Signs of infection may include fever, chills, redness, swelling, or pus drainage from the PICC insertion site. °· PICC movement (malposition). The PICC tip may move from its original position due to excessive physical activity, forceful coughing, sneezing, or vomiting. °· A break or cut in the PICC. It is important to not use scissors near the PICC. °· Nerve or tendon irritation or injury during PICC insertion. ° °What should I keep in mind about activities when I have a PICC? °· You may bend your arm and move it freely. If your PICC is near or at the bend of your elbow, avoid activity with repeated motion at the elbow. °· Rest at home for the remainder of the day following PICC line insertion. °· Avoid lifting heavy objects as instructed by your health care provider. °· Avoid using a crutch with the arm on the same side as your PICC. You may need to use a walker. °What should I know about my PICC dressing? °· Keep your PICC bandage (dressing) clean and dry to prevent infection. °? Ask your health care provider when you may shower. Ask your health care provider to teach you how to wrap the PICC when you do take a shower. °· Change the PICC dressing as instructed by your health care provider. °· Change your PICC dressing if it becomes loose or wet. °What should I know about PICC care? °· Check the PICC insertion   site daily for leakage, redness, swelling, or pain. °· Do not take a bath, swim, or use hot tubs when you have a PICC. Cover PICC line with clear plastic wrap and tape to keep it dry while showering. °· Flush the PICC as directed by your health care provider. Let your health care provider know right away if the PICC is difficult to flush or does not flush. Do not use force to flush the PICC. °· Do not use a syringe that is less than 10 mL to flush the PICC. °· Never pull or tug on the PICC. °· Avoid blood pressure checks on the arm with the PICC. °· Keep your PICC identification card with you at all  times. °· Do not take the PICC out yourself. Only a trained clinical professional should remove the PICC. °Get help right away if: °· Your PICC is accidentally pulled all the way out. If this happens, cover the insertion site with a bandage or gauze dressing. Do not throw the PICC away. Your health care provider will need to inspect it. °· Your PICC was tugged or pulled and has partially come out. Do not  push the PICC back in. °· There is any type of drainage, redness, or swelling where the PICC enters the skin. °· You cannot flush the PICC, it is difficult to flush, or the PICC leaks around the insertion site when it is flushed. °· You hear a "flushing" sound when the PICC is flushed. °· You have pain, discomfort, or numbness in your arm, shoulder, or jaw on the same side as the PICC. °· You feel your heart "racing" or skipping beats. °· You notice a hole or tear in the PICC. °· You develop chills or a fever. °This information is not intended to replace advice given to you by your health care provider. Make sure you discuss any questions you have with your health care provider. °Document Released: 09/26/2002 Document Revised: 10/10/2015 Document Reviewed: 01/12/2013 °Elsevier Interactive Patient Education © 2017 [employer]. ° °

## 2016-08-18 ENCOUNTER — Ambulatory Visit (HOSPITAL_BASED_OUTPATIENT_CLINIC_OR_DEPARTMENT_OTHER): Payer: Medicaid Other

## 2016-08-18 DIAGNOSIS — Z95828 Presence of other vascular implants and grafts: Secondary | ICD-10-CM

## 2016-08-18 DIAGNOSIS — C3491 Malignant neoplasm of unspecified part of right bronchus or lung: Secondary | ICD-10-CM

## 2016-08-18 DIAGNOSIS — C770 Secondary and unspecified malignant neoplasm of lymph nodes of head, face and neck: Secondary | ICD-10-CM

## 2016-08-18 DIAGNOSIS — Z452 Encounter for adjustment and management of vascular access device: Secondary | ICD-10-CM

## 2016-08-18 MED ORDER — SODIUM CHLORIDE 0.9% FLUSH
10.0000 mL | Freq: Once | INTRAVENOUS | Status: AC
  Administered 2016-08-18: 10 mL
  Filled 2016-08-18: qty 10

## 2016-08-18 MED ORDER — HEPARIN SOD (PORK) LOCK FLUSH 100 UNIT/ML IV SOLN
250.0000 [IU] | Freq: Once | INTRAVENOUS | Status: DC
  Filled 2016-08-18: qty 5

## 2016-08-18 NOTE — Patient Instructions (Signed)
PICC Home Guide °A peripherally inserted central catheter (PICC) is a long, thin, flexible tube that is inserted into a vein in the upper arm. It is a form of intravenous (IV) access. It is considered to be a "central" line because the tip of the PICC ends in a large vein in your chest. This large vein is called the superior vena cava (SVC). The PICC tip ends in the SVC because there is a lot of blood flow in the SVC. This allows medicines and IV fluids to be quickly distributed throughout the body. The PICC is inserted using a sterile technique by a specially trained nurse or physician. After the PICC is inserted, a chest X-ray exam is done to be sure it is in the correct place. °A PICC may be placed for different reasons, such as: °· To give medicines and liquid nutrition that can only be given through a central line. Examples are: °? Certain antibiotic treatments. °? Chemotherapy. °? Total parenteral nutrition (TPN). °· To take frequent blood samples. °· To give IV fluids and blood products. °· If there is difficulty placing a peripheral intravenous (PIV) catheter. ° °If taken care of properly, a PICC can remain in place for several months. A PICC can also allow a person to go home from the hospital early. Medicine and PICC care can be managed at home by a family member or home health care team. °What problems can happen when I have a PICC? °Problems with a PICC can occasionally occur. These may include the following: °· A blood clot (thrombus) forming in or at the tip of the PICC. This can cause the PICC to become clogged. A clot-dissolving medicine called tissue plasminogen activator (tPA) can be given through the PICC to help break up the clot. °· Inflammation of the vein (phlebitis) in which the PICC is placed. Signs of inflammation may include redness, pain at the insertion site, red streaks, or being able to feel a "cord" in the vein where the PICC is located. °· Infection in the PICC or at the insertion  site. Signs of infection may include fever, chills, redness, swelling, or pus drainage from the PICC insertion site. °· PICC movement (malposition). The PICC tip may move from its original position due to excessive physical activity, forceful coughing, sneezing, or vomiting. °· A break or cut in the PICC. It is important to not use scissors near the PICC. °· Nerve or tendon irritation or injury during PICC insertion. ° °What should I keep in mind about activities when I have a PICC? °· You may bend your arm and move it freely. If your PICC is near or at the bend of your elbow, avoid activity with repeated motion at the elbow. °· Rest at home for the remainder of the day following PICC line insertion. °· Avoid lifting heavy objects as instructed by your health care provider. °· Avoid using a crutch with the arm on the same side as your PICC. You may need to use a walker. °What should I know about my PICC dressing? °· Keep your PICC bandage (dressing) clean and dry to prevent infection. °? Ask your health care provider when you may shower. Ask your health care provider to teach you how to wrap the PICC when you do take a shower. °· Change the PICC dressing as instructed by your health care provider. °· Change your PICC dressing if it becomes loose or wet. °What should I know about PICC care? °· Check the PICC insertion   site daily for leakage, redness, swelling, or pain. °· Do not take a bath, swim, or use hot tubs when you have a PICC. Cover PICC line with clear plastic wrap and tape to keep it dry while showering. °· Flush the PICC as directed by your health care provider. Let your health care provider know right away if the PICC is difficult to flush or does not flush. Do not use force to flush the PICC. °· Do not use a syringe that is less than 10 mL to flush the PICC. °· Never pull or tug on the PICC. °· Avoid blood pressure checks on the arm with the PICC. °· Keep your PICC identification card with you at all  times. °· Do not take the PICC out yourself. Only a trained clinical professional should remove the PICC. °Get help right away if: °· Your PICC is accidentally pulled all the way out. If this happens, cover the insertion site with a bandage or gauze dressing. Do not throw the PICC away. Your health care provider will need to inspect it. °· Your PICC was tugged or pulled and has partially come out. Do not  push the PICC back in. °· There is any type of drainage, redness, or swelling where the PICC enters the skin. °· You cannot flush the PICC, it is difficult to flush, or the PICC leaks around the insertion site when it is flushed. °· You hear a "flushing" sound when the PICC is flushed. °· You have pain, discomfort, or numbness in your arm, shoulder, or jaw on the same side as the PICC. °· You feel your heart "racing" or skipping beats. °· You notice a hole or tear in the PICC. °· You develop chills or a fever. °This information is not intended to replace advice given to you by your health care provider. Make sure you discuss any questions you have with your health care provider. °Document Released: 09/26/2002 Document Revised: 10/10/2015 Document Reviewed: 01/12/2013 °Elsevier Interactive Patient Education © 2017 [employer] ° °

## 2016-08-20 ENCOUNTER — Ambulatory Visit (HOSPITAL_BASED_OUTPATIENT_CLINIC_OR_DEPARTMENT_OTHER): Payer: Medicaid Other

## 2016-08-20 DIAGNOSIS — Z452 Encounter for adjustment and management of vascular access device: Secondary | ICD-10-CM

## 2016-08-20 DIAGNOSIS — C3491 Malignant neoplasm of unspecified part of right bronchus or lung: Secondary | ICD-10-CM | POA: Diagnosis not present

## 2016-08-20 DIAGNOSIS — C770 Secondary and unspecified malignant neoplasm of lymph nodes of head, face and neck: Secondary | ICD-10-CM

## 2016-08-20 MED ORDER — HEPARIN SOD (PORK) LOCK FLUSH 100 UNIT/ML IV SOLN
250.0000 [IU] | Freq: Once | INTRAVENOUS | Status: AC
  Administered 2016-08-20: 250 [IU]
  Filled 2016-08-20: qty 5

## 2016-08-20 MED ORDER — SODIUM CHLORIDE 0.9% FLUSH
10.0000 mL | Freq: Once | INTRAVENOUS | Status: AC
  Administered 2016-08-20: 10 mL
  Filled 2016-08-20: qty 10

## 2016-08-22 ENCOUNTER — Other Ambulatory Visit: Payer: Self-pay | Admitting: Hematology

## 2016-08-22 DIAGNOSIS — E039 Hypothyroidism, unspecified: Secondary | ICD-10-CM | POA: Insufficient documentation

## 2016-08-22 DIAGNOSIS — E032 Hypothyroidism due to medicaments and other exogenous substances: Secondary | ICD-10-CM

## 2016-08-22 MED ORDER — LEVOTHYROXINE SODIUM 50 MCG PO TABS: 50.0000 ug | ORAL_TABLET | Freq: Every day | ORAL | 1 refills | Status: DC

## 2016-08-23 ENCOUNTER — Ambulatory Visit: Payer: Medicaid Other

## 2016-08-23 ENCOUNTER — Ambulatory Visit (HOSPITAL_BASED_OUTPATIENT_CLINIC_OR_DEPARTMENT_OTHER): Payer: Medicaid Other

## 2016-08-23 ENCOUNTER — Encounter: Payer: Self-pay | Admitting: Hematology

## 2016-08-23 ENCOUNTER — Telehealth: Payer: Self-pay | Admitting: Hematology

## 2016-08-23 ENCOUNTER — Ambulatory Visit (HOSPITAL_BASED_OUTPATIENT_CLINIC_OR_DEPARTMENT_OTHER): Payer: Medicaid Other | Admitting: Hematology

## 2016-08-23 ENCOUNTER — Other Ambulatory Visit (HOSPITAL_BASED_OUTPATIENT_CLINIC_OR_DEPARTMENT_OTHER): Payer: Medicaid Other

## 2016-08-23 VITALS — HR 116

## 2016-08-23 VITALS — BP 139/93 | HR 129 | Temp 98.7°F | Resp 18 | Ht 69.0 in | Wt 255.4 lb

## 2016-08-23 DIAGNOSIS — R000 Tachycardia, unspecified: Secondary | ICD-10-CM | POA: Diagnosis not present

## 2016-08-23 DIAGNOSIS — C770 Secondary and unspecified malignant neoplasm of lymph nodes of head, face and neck: Secondary | ICD-10-CM

## 2016-08-23 DIAGNOSIS — G620 Drug-induced polyneuropathy: Secondary | ICD-10-CM | POA: Diagnosis not present

## 2016-08-23 DIAGNOSIS — Z95828 Presence of other vascular implants and grafts: Secondary | ICD-10-CM

## 2016-08-23 DIAGNOSIS — C3492 Malignant neoplasm of unspecified part of left bronchus or lung: Secondary | ICD-10-CM

## 2016-08-23 DIAGNOSIS — Z5112 Encounter for antineoplastic immunotherapy: Secondary | ICD-10-CM

## 2016-08-23 DIAGNOSIS — M940 Chondrocostal junction syndrome [Tietze]: Secondary | ICD-10-CM | POA: Diagnosis not present

## 2016-08-23 DIAGNOSIS — K5900 Constipation, unspecified: Secondary | ICD-10-CM

## 2016-08-23 DIAGNOSIS — R05 Cough: Secondary | ICD-10-CM

## 2016-08-23 DIAGNOSIS — R5383 Other fatigue: Secondary | ICD-10-CM | POA: Diagnosis not present

## 2016-08-23 DIAGNOSIS — D509 Iron deficiency anemia, unspecified: Secondary | ICD-10-CM | POA: Diagnosis not present

## 2016-08-23 DIAGNOSIS — E1140 Type 2 diabetes mellitus with diabetic neuropathy, unspecified: Secondary | ICD-10-CM

## 2016-08-23 DIAGNOSIS — E039 Hypothyroidism, unspecified: Secondary | ICD-10-CM | POA: Diagnosis not present

## 2016-08-23 DIAGNOSIS — E279 Disorder of adrenal gland, unspecified: Secondary | ICD-10-CM | POA: Diagnosis not present

## 2016-08-23 DIAGNOSIS — I10 Essential (primary) hypertension: Secondary | ICD-10-CM | POA: Diagnosis not present

## 2016-08-23 DIAGNOSIS — G893 Neoplasm related pain (acute) (chronic): Secondary | ICD-10-CM | POA: Diagnosis not present

## 2016-08-23 DIAGNOSIS — T451X5A Adverse effect of antineoplastic and immunosuppressive drugs, initial encounter: Secondary | ICD-10-CM

## 2016-08-23 DIAGNOSIS — R0789 Other chest pain: Secondary | ICD-10-CM

## 2016-08-23 DIAGNOSIS — C3491 Malignant neoplasm of unspecified part of right bronchus or lung: Secondary | ICD-10-CM

## 2016-08-23 DIAGNOSIS — Z452 Encounter for adjustment and management of vascular access device: Secondary | ICD-10-CM

## 2016-08-23 DIAGNOSIS — E119 Type 2 diabetes mellitus without complications: Secondary | ICD-10-CM | POA: Diagnosis not present

## 2016-08-23 LAB — COMPREHENSIVE METABOLIC PANEL
ALT: 8 U/L (ref 0–55)
ANION GAP: 11 meq/L (ref 3–11)
AST: 12 U/L (ref 5–34)
Albumin: 3.3 g/dL — ABNORMAL LOW (ref 3.5–5.0)
Alkaline Phosphatase: 82 U/L (ref 40–150)
BILIRUBIN TOTAL: 0.28 mg/dL (ref 0.20–1.20)
BUN: 10.3 mg/dL (ref 7.0–26.0)
CO2: 27 meq/L (ref 22–29)
CREATININE: 0.8 mg/dL (ref 0.6–1.1)
Calcium: 9.1 mg/dL (ref 8.4–10.4)
Chloride: 102 mEq/L (ref 98–109)
EGFR: >90 mL/min/{1.73_m2} (ref >90)
Glucose: 247 mg/dl — ABNORMAL HIGH (ref 70–140)
Potassium: 3.7 mEq/L (ref 3.5–5.1)
Sodium: 140 mEq/L (ref 136–145)
TOTAL PROTEIN: 7.7 g/dL (ref 6.4–8.3)

## 2016-08-23 LAB — CBC & DIFF AND RETIC
BASO%: 0.3 % (ref 0.0–2.0)
Basophils Absolute: 0 10*3/uL (ref 0.0–0.1)
EOS%: 6.3 % (ref 0.0–7.0)
Eosinophils Absolute: 0.4 10*3/uL (ref 0.0–0.5)
HCT: 34.4 % — ABNORMAL LOW (ref 34.8–46.6)
HGB: 11 g/dL — ABNORMAL LOW (ref 11.6–15.9)
IMMATURE RETIC FRACT: 3.7 % (ref 1.60–10.00)
LYMPH#: 1.5 10*3/uL (ref 0.9–3.3)
LYMPH%: 24.9 % (ref 14.0–49.7)
MCH: 26.6 pg (ref 25.1–34.0)
MCHC: 32 g/dL (ref 31.5–36.0)
MCV: 83.1 fL (ref 79.5–101.0)
MONO#: 0.3 10*3/uL (ref 0.1–0.9)
MONO%: 5.1 % (ref 0.0–14.0)
NEUT%: 63.4 % (ref 38.4–76.8)
NEUTROS ABS: 3.8 10*3/uL (ref 1.5–6.5)
NRBC: 0 % (ref 0–0)
Platelets: 293 10*3/uL (ref 145–400)
RBC: 4.14 10*6/uL (ref 3.70–5.45)
RDW: 15.1 % — AB (ref 11.2–14.5)
RETIC CT ABS: 62.93 10*3/uL (ref 33.70–90.70)
Retic %: 1.52 % (ref 0.70–2.10)
WBC: 6 10*3/uL (ref 3.9–10.3)

## 2016-08-23 MED ORDER — SODIUM CHLORIDE 0.9 % IV SOLN
9.6000 mg/kg | Freq: Once | INTRAVENOUS | Status: AC
  Administered 2016-08-23: 1120 mg via INTRAVENOUS
  Filled 2016-08-23: qty 20

## 2016-08-23 MED ORDER — ACETAMINOPHEN 325 MG PO TABS
ORAL_TABLET | ORAL | Status: AC
  Filled 2016-08-23: qty 2

## 2016-08-23 MED ORDER — DIPHENHYDRAMINE HCL 25 MG PO CAPS
ORAL_CAPSULE | ORAL | Status: AC
  Filled 2016-08-23: qty 1

## 2016-08-23 MED ORDER — NAPROXEN 500 MG PO TABS: 500.0000 mg | ORAL_TABLET | Freq: Two times a day (BID) | ORAL | 0 refills | Status: DC | PRN

## 2016-08-23 MED ORDER — DIPHENHYDRAMINE HCL 25 MG PO TABS
25.0000 mg | ORAL_TABLET | Freq: Once | ORAL | Status: AC
  Administered 2016-08-23: 25 mg via ORAL
  Filled 2016-08-23: qty 1

## 2016-08-23 MED ORDER — SODIUM CHLORIDE 0.9 % IJ SOLN
10.0000 mL | INTRAMUSCULAR | Status: DC | PRN
  Administered 2016-08-23: 10 mL via INTRAVENOUS
  Filled 2016-08-23: qty 10

## 2016-08-23 MED ORDER — SODIUM CHLORIDE 0.9 % IV SOLN
Freq: Once | INTRAVENOUS | Status: AC
  Administered 2016-08-23: 16:00:00 via INTRAVENOUS

## 2016-08-23 MED ORDER — SODIUM CHLORIDE 0.9% FLUSH
10.0000 mL | INTRAVENOUS | Status: DC | PRN
  Administered 2016-08-23: 10 mL
  Filled 2016-08-23: qty 10

## 2016-08-23 MED ORDER — BENZONATATE 100 MG PO CAPS: 100.0000 mg | ORAL_CAPSULE | Freq: Three times a day (TID) | ORAL | 0 refills | Status: DC | PRN

## 2016-08-23 MED ORDER — ACETAMINOPHEN 325 MG PO TABS
650.0000 mg | ORAL_TABLET | Freq: Once | ORAL | Status: AC
  Administered 2016-08-23: 650 mg via ORAL

## 2016-08-23 MED ORDER — DILTIAZEM HCL ER COATED BEADS 180 MG PO CP24: 180.0000 mg | ORAL_CAPSULE | Freq: Every day | ORAL | 1 refills | Status: DC

## 2016-08-23 MED ORDER — HEPARIN SOD (PORK) LOCK FLUSH 100 UNIT/ML IV SOLN
250.0000 [IU] | Freq: Once | INTRAVENOUS | Status: AC | PRN
  Administered 2016-08-23: 250 [IU]
  Filled 2016-08-23: qty 5

## 2016-08-23 MED ORDER — DULOXETINE HCL 30 MG PO CPEP: 30.0000 mg | ORAL_CAPSULE | Freq: Every day | ORAL | 1 refills | Status: DC

## 2016-08-23 NOTE — Patient Instructions (Signed)
PICC Home Guide °A peripherally inserted central catheter (PICC) is a long, thin, flexible tube that is inserted into a vein in the upper arm. It is a form of intravenous (IV) access. It is considered to be a "central" line because the tip of the PICC ends in a large vein in your chest. This large vein is called the superior vena cava (SVC). The PICC tip ends in the SVC because there is a lot of blood flow in the SVC. This allows medicines and IV fluids to be quickly distributed throughout the body. The PICC is inserted using a sterile technique by a specially trained nurse or physician. After the PICC is inserted, a chest X-ray exam is done to be sure it is in the correct place. °A PICC may be placed for different reasons, such as: °· To give medicines and liquid nutrition that can only be given through a central line. Examples are: °? Certain antibiotic treatments. °? Chemotherapy. °? Total parenteral nutrition (TPN). °· To take frequent blood samples. °· To give IV fluids and blood products. °· If there is difficulty placing a peripheral intravenous (PIV) catheter. ° °If taken care of properly, a PICC can remain in place for several months. A PICC can also allow a person to go home from the hospital early. Medicine and PICC care can be managed at home by a family member or home health care team. °What problems can happen when I have a PICC? °Problems with a PICC can occasionally occur. These may include the following: °· A blood clot (thrombus) forming in or at the tip of the PICC. This can cause the PICC to become clogged. A clot-dissolving medicine called tissue plasminogen activator (tPA) can be given through the PICC to help break up the clot. °· Inflammation of the vein (phlebitis) in which the PICC is placed. Signs of inflammation may include redness, pain at the insertion site, red streaks, or being able to feel a "cord" in the vein where the PICC is located. °· Infection in the PICC or at the insertion  site. Signs of infection may include fever, chills, redness, swelling, or pus drainage from the PICC insertion site. °· PICC movement (malposition). The PICC tip may move from its original position due to excessive physical activity, forceful coughing, sneezing, or vomiting. °· A break or cut in the PICC. It is important to not use scissors near the PICC. °· Nerve or tendon irritation or injury during PICC insertion. ° °What should I keep in mind about activities when I have a PICC? °· You may bend your arm and move it freely. If your PICC is near or at the bend of your elbow, avoid activity with repeated motion at the elbow. °· Rest at home for the remainder of the day following PICC line insertion. °· Avoid lifting heavy objects as instructed by your health care provider. °· Avoid using a crutch with the arm on the same side as your PICC. You may need to use a walker. °What should I know about my PICC dressing? °· Keep your PICC bandage (dressing) clean and dry to prevent infection. °? Ask your health care provider when you may shower. Ask your health care provider to teach you how to wrap the PICC when you do take a shower. °· Change the PICC dressing as instructed by your health care provider. °· Change your PICC dressing if it becomes loose or wet. °What should I know about PICC care? °· Check the PICC insertion   site daily for leakage, redness, swelling, or pain. °· Do not take a bath, swim, or use hot tubs when you have a PICC. Cover PICC line with clear plastic wrap and tape to keep it dry while showering. °· Flush the PICC as directed by your health care provider. Let your health care provider know right away if the PICC is difficult to flush or does not flush. Do not use force to flush the PICC. °· Do not use a syringe that is less than 10 mL to flush the PICC. °· Never pull or tug on the PICC. °· Avoid blood pressure checks on the arm with the PICC. °· Keep your PICC identification card with you at all  times. °· Do not take the PICC out yourself. Only a trained clinical professional should remove the PICC. °Get help right away if: °· Your PICC is accidentally pulled all the way out. If this happens, cover the insertion site with a bandage or gauze dressing. Do not throw the PICC away. Your health care provider will need to inspect it. °· Your PICC was tugged or pulled and has partially come out. Do not  push the PICC back in. °· There is any type of drainage, redness, or swelling where the PICC enters the skin. °· You cannot flush the PICC, it is difficult to flush, or the PICC leaks around the insertion site when it is flushed. °· You hear a "flushing" sound when the PICC is flushed. °· You have pain, discomfort, or numbness in your arm, shoulder, or jaw on the same side as the PICC. °· You feel your heart "racing" or skipping beats. °· You notice a hole or tear in the PICC. °· You develop chills or a fever. °This information is not intended to replace advice given to you by your health care provider. Make sure you discuss any questions you have with your health care provider. °Document Released: 09/26/2002 Document Revised: 10/10/2015 Document Reviewed: 01/12/2013 °Elsevier Interactive Patient Education © 2017 [employer] ° °

## 2016-08-23 NOTE — Patient Instructions (Addendum)
Patient instructions - Please pickup levothyroxine prescription for thyroid hormone replacement -Naproxen '500mg'$  PO BID as needed for chest wall pain from costochondritis -tessalon perles prn for cough   Thank you for choosing [facility] to provide your oncology and hematology care.  To afford each patient quality time with our providers, please arrive 30 minutes before your scheduled appointment time.  If you arrive late for your appointment, you may be asked to reschedule.  We strive to give you quality time with our providers, and arriving late affects you and other patients whose appointments are after yours.   If you are a no show for multiple scheduled visits, you may be dismissed from the clinic at the providers discretion.    Again, thank you for choosing [facility], our hope is that these requests will decrease the amount of time that you wait before being seen by our physicians.  ______________________________________________________________________  Should you have questions after your visit to the [facility], please contact our office at [phone] between the hours of 8:30 and 4:30 p.m.    Voicemails left after 4:30p.m will not be returned until the following business day.    For prescription refill requests, please have your pharmacy contact us directly.  Please also try to allow 48 hours for prescription requests.    Please contact the scheduling department for questions regarding scheduling.  For scheduling of procedures such as PET scans, CT scans, MRI, Ultrasound, etc please contact central scheduling at [phone].    Resources For Cancer Patients and Caregivers:   Oncolink.org:  A wonderful resource for patients and healthcare providers for information regarding your disease, ways to tract your treatment, what to expect, etc.     [place]:  719-241-6484  Can help patients locate various types of support and  financial assistance  Cancer Care: 1-800-813-HOPE 7792069928) Provides financial assistance, online support groups, medication/co-pay assistance.    [place]:  (905)320-8008 Where to apply for food stamps, Medicaid, and utility assistance  Medicare Rights [facility]: (619)577-5854 Helps people with Medicare understand their rights and benefits, navigate the Medicare system, and secure the quality healthcare they deserve  [employer]: [employer]'s shared-ride transportation service for eligible riders who have a disability that prevents them from riding the fixed route bus.    For additional information on assistance programs please contact our social worker:   [place]:  [phone]

## 2016-08-23 NOTE — Telephone Encounter (Signed)
Scheduled appt per 5/21 los - patient to receive new schedule next visit. [facility] to contact patient with ct schedule . Referral will be contacted when offices open tomorrow 5/22

## 2016-08-23 NOTE — Progress Notes (Signed)
Per Delle Reining RN, per MD Ocie Bob to treat with HR [phone]

## 2016-08-23 NOTE — Patient Instructions (Signed)
Cancer Center Discharge Instructions for Patients Receiving Chemotherapy  Today you received the following chemotherapy agents:  Imfinzi.  To help prevent nausea and vomiting after your treatment, we encourage you to take your nausea medication as directed.   If you develop nausea and vomiting that is not controlled by your nausea medication, call the clinic.   BELOW ARE SYMPTOMS THAT SHOULD BE REPORTED IMMEDIATELY:  *FEVER GREATER THAN 100.5 F  *CHILLS WITH OR WITHOUT FEVER  NAUSEA AND VOMITING THAT IS NOT CONTROLLED WITH YOUR NAUSEA MEDICATION  *UNUSUAL SHORTNESS OF BREATH  *UNUSUAL BRUISING OR BLEEDING  TENDERNESS IN MOUTH AND THROAT WITH OR WITHOUT PRESENCE OF ULCERS  *URINARY PROBLEMS  *BOWEL PROBLEMS  UNUSUAL RASH Items with * indicate a potential emergency and should be followed up as soon as possible.  Feel free to call the clinic you have any questions or concerns. The clinic phone number is [phone].  Please show the CHEMO ALERT CARD at check-in to the Emergency Department and triage nurse.   

## 2016-08-25 ENCOUNTER — Telehealth: Payer: Self-pay | Admitting: Hematology

## 2016-08-25 ENCOUNTER — Ambulatory Visit (HOSPITAL_BASED_OUTPATIENT_CLINIC_OR_DEPARTMENT_OTHER): Payer: Medicaid Other

## 2016-08-25 DIAGNOSIS — Z452 Encounter for adjustment and management of vascular access device: Secondary | ICD-10-CM

## 2016-08-25 DIAGNOSIS — C3491 Malignant neoplasm of unspecified part of right bronchus or lung: Secondary | ICD-10-CM | POA: Diagnosis not present

## 2016-08-25 MED ORDER — SODIUM CHLORIDE 0.9% FLUSH
10.0000 mL | Freq: Once | INTRAVENOUS | Status: AC
  Administered 2016-08-25: 10 mL
  Filled 2016-08-25: qty 10

## 2016-08-25 MED ORDER — HEPARIN SOD (PORK) LOCK FLUSH 100 UNIT/ML IV SOLN
250.0000 [IU] | Freq: Once | INTRAVENOUS | Status: AC
  Administered 2016-08-25: 15:00:00
  Filled 2016-08-25: qty 5

## 2016-08-25 NOTE — Progress Notes (Signed)
Marilyn [name]    HEMATOLOGY/ONCOLOGY CLINIC NOTE  Date of Service: .08/23/2016  Patient Care Team: System, Pcp Not In as PCP - General  CHIEF COMPLAINTS/PURPOSE OF CONSULTATION:  f/u Lung Adenocarcinoma.  Diagnosis Stage IIIB Lung Adenocarcinoma  Current treatment: -Starting Maintain Durvalumab from today 05/24/2016  PreviousTreatment -Concurrent Chemo-radiation with Carboplatin/Taxol. Completed RT on 02/13/2016 and has then completed 2 cycles of carboplatin + taxol.  HISTORY OF PRESENTING ILLNESS: Plz see my previous note for details on initial presentation.  INTERVAL HISTORY  Patient is here for her scheduled followup for prior to 6th dose of maintenance Durvalumab for her stage IIIB lung adenocarcinoma.  She has been following with cancer rehabilitation and notes that she is functioning better and her fatigue is improved. She has been given a prescription for levothyroxine for hypothyroidism and also an endocrinology referral. This hopefully will improve her fatigue further. She notes a mild dry cough and is requesting something for this and was given a Tessalon pearles prescription. Notes chest wall pain which is consistent with left-sided costochondritis probably from cough. He was recommended to use over-the-counter lidocaine patch and could also use naproxen when necessary. Patient notes no fevers or chills. Notes that her breathing has not been better over the last several weeks. Notes that her diabetes is much better controlled and she is eating more mindfully. She notes her shortness of breath is better. No chest pain.   Has been feeling somewhat sad and emotionally down since her 53 year old son got shocked and has had spinal injury that could potentially leave him paralyzed. This is significantly weighing on her mind as expected. No suicidal or homicidal ideation.  MEDICAL HISTORY:  Past Medical History:  Diagnosis Date  . Asthma   . Diabetes mellitus   . Hypercholesteremia    . Hypertension   . Lung cancer ([place])   . Neuropathy   . Obese     SURGICAL HISTORY: Past Surgical History:  Procedure Laterality Date  . CESAREAN SECTION    . IR GENERIC HISTORICAL  01/08/2016   IR US GUIDE VASC ACCESS RIGHT 01/08/2016 [name], DO [facility]  . IR GENERIC HISTORICAL  01/08/2016   IR FLUORO GUIDE CV LINE RIGHT 01/08/2016 [name], DO [facility]  . TUBAL LIGATION    . US guided core needle biopsy     of right lower neck/supraclavicular lymph nodes    SOCIAL HISTORY: Social History   Social History  . Marital status: Divorced    Spouse name: N/A  . Number of children: N/A  . Years of education: N/A   Occupational History  . Not on file.   Social History Main Topics  . Smoking status: Never Smoker  . Smokeless tobacco: Never Used  . Alcohol use No  . Drug use: No  . Sexual activity: Not Currently   Other Topics Concern  . Not on file   Social History Narrative   No bird or mold exposure. No recent travel.    FAMILY HISTORY: Family History  Problem Relation Age of Onset  . Cancer Neg Hx   . Rheumatologic disease Neg Hx     ALLERGIES:  has No Known Allergies.  MEDICATIONS:  Current Outpatient Prescriptions  Medication Sig Dispense Refill  . aspirin EC 81 MG tablet Take 1 tablet (81 mg total) by mouth daily. 60 tablet 2  . diltiazem (CARDIZEM CD) 180 MG 24 hr capsule Take 1 capsule (180 mg total) by mouth daily. 30 capsule 1  . dronabinol (MARINOL)  2.5 MG capsule Take 1 capsule (2.5 mg total) by mouth 2 (two) times daily before a meal. 60 capsule 0  . DULoxetine (CYMBALTA) 30 MG capsule Take 1 capsule (30 mg total) by mouth daily. 30 capsule 1  . fluconazole (DIFLUCAN) 100 MG tablet Take 1 tablet (100 mg total) by mouth daily. 14 tablet 1  . Fluocinonide 0.1 % CREA Apply 1 application topically daily.     Marilyn [name] HYDROcodone-acetaminophen (HYCET) 7.5-325 mg/15 ml solution Take 15 mLs by mouth 4 (four) times daily as needed for moderate  pain. 473 mL 0  . insulin aspart (NOVOLOG) 100 UNIT/ML FlexPen As per sliding scale instructions (Patient taking differently: Inject 0-20 Units into the skin 3 (three) times daily as needed for high blood sugar. As per sliding scale instructions) 15 mL 2  . Insulin Degludec (TRESIBA FLEXTOUCH) 200 UNIT/ML SOPN Inject 100 Units into the skin daily before breakfast.     . levofloxacin (LEVAQUIN) 750 MG tablet Take 1 tablet (750 mg total) by mouth daily. 5 tablet 0  . levothyroxine (SYNTHROID) 50 MCG tablet Take 1 tablet (50 mcg total) by mouth daily before breakfast. 30 tablet 1  . lidocaine-prilocaine (EMLA) cream Apply to affected area once 30 g 3  . loratadine (CLARITIN) 10 MG tablet Take 10 mg by mouth daily.    Marilyn [name] oxyCODONE-acetaminophen (PERCOCET/ROXICET) 5-325 MG tablet Take 1-2 tablets by mouth every 4 (four) hours as needed for severe pain. 20 tablet 0  . polyethylene glycol (MIRALAX) packet Take 17 g by mouth daily. 30 each 1  . senna-docusate (SENNA S) 8.6-50 MG tablet Take 2 tablets by mouth 2 (two) times daily. May reduce to 2 tab po HS as needed once regular BM estabished 60 tablet 1  . Wound Dressings (SONAFINE) Apply 1 application topically 2 (two) times daily.    . benzonatate (TESSALON) 100 MG capsule Take 1 capsule (100 mg total) by mouth 3 (three) times daily as needed for cough. 30 capsule 0  . naproxen (NAPROSYN) 500 MG tablet Take 1 tablet (500 mg total) by mouth 2 (two) times daily as needed for moderate pain. 30 tablet 0   No current facility-administered medications for this visit.    Facility-Administered Medications Ordered in Other Visits  Medication Dose Route Frequency Provider Last Rate Last Dose  . sodium chloride 0.9 % injection 10 mL  10 mL Intravenous PRN [name], MD   10 mL at 05/24/16 1252    REVIEW OF SYSTEMS:    10 Point review of Systems was done is negative except as noted above.  PHYSICAL EXAMINATION: ECOG PERFORMANCE STATUS: 1 -  Symptomatic but completely ambulatory  . Vitals:   08/23/16 1423  BP: (!) 139/93  Pulse: (!) 129  Resp: 18  Temp: 98.7 F (37.1 C)   Filed Weights   08/23/16 1423  Weight: 255 lb 6.4 oz (115.8 kg)   .Body mass index is 37.72 kg/m.  GENERAL:alert, in no acute distress and comfortable SKIN: radiation related skin injury over neck - healing EYES: normal, conjunctiva are pink and non-injected, sclera clear OROPHARYNX:oral thrush noted NECK: supple, no JVD, thyroid normal size, non-tender, without nodularity LYMPH:  no palpable lymphadenopathy in the cervical, axillary or inguinal.             LUNGS: clear to auscultation with normal respiratory effort. Distant breath sounds  HEART: regular rate & rhythm,  no murmurs and no lower extremity edema ABDOMEN: abdomen obese, soft, non-tender, normoactive bowel sounds  Musculoskeletal: no cyanosis of digits and no clubbing  PSYCH: alert & oriented x 3 with fluent speech NEURO: no focal motor/sensory deficits  LABORATORY DATA: .  Marilyn [name] CBC Latest Ref Rng & Units 08/23/2016 08/09/2016 07/26/2016  WBC 3.9 - 10.3 10e3/uL 6.0 5.7 6.0  Hemoglobin 11.6 - 15.9 g/dL 11.0(L) 10.0(L) 10.4(L)  Hematocrit 34.8 - 46.6 % 34.4(L) 31.3(L) 31.6(L)  Platelets 145 - 400 10e3/uL 293 277 315   . CMP Latest Ref Rng & Units 08/23/2016 08/09/2016 07/26/2016  Glucose 70 - 140 mg/dl 247(H) 227(H) 179(H)  BUN 7.0 - 26.0 mg/dL 10.3 8.8 6.4(L)  Creatinine 0.6 - 1.1 mg/dL 0.8 0.7 0.7  Sodium 136 - 145 mEq/L 140 139 142  Potassium 3.5 - 5.1 mEq/L 3.7 3.8 3.7  Chloride 101 - 111 mmol/L - - -  CO2 22 - 29 mEq/L '27 28 27  ' Calcium 8.4 - 10.4 mg/dL 9.1 9.0 9.2  Total Protein 6.4 - 8.3 g/dL 7.7 7.3 7.3  Total Bilirubin 0.20 - 1.20 mg/dL 0.28 0.29 0.26  Alkaline Phos 40 - 150 U/L 82 83 88  AST 5 - 34 U/L '12 11 15  ' ALT 0 - 55 U/L '8 11 10                ' RADIOGRAPHIC STUDIES: No new studies.  ASSESSMENT & PLAN:   51 year old African-American female with  history of hypertension, diabetes, asthma, dysuria mass and morbid obesity with   #1 Lung Adenocarcinoma Rt sided atleast Stage IIIB with large right paratracheal mass with mediastinal adenopathy that appears to have grown significantly over the last 6-7 months and rt supraclavicular LN +.   Noted to have a small mass in the left adrenal on CT but PET/CT neg for metastatic disease. Patient has been a lifelong nonsmoker  MRI of the brain was negative for any metastatic disease.  High PDL1 expression (90%) on [facility] Neg for EGFR, ALK, ROS-1 and BRAF mutations.  Patient has completed her planned definitive chemoradiation with carbo Taxol on 02/13/2016. No prohibitive toxicities other than some grade 1 skin desquamation and some grade 1-2 radiation esophagitis. She has subsequently received 2 out of 2 planned dose of carboplatin + Taxol.  Patient has completed 5 doses of maintenance  Durvalumab  #2 s/p pneumonia/radiation pneumonitis . #3 Improving fatigue (grade 1) -working with PT once weekly and encourage to walk as much as possible and optimize DM2 management and Mx of newly diagnosed hypothyroidism. #4 normocytic anemia related to chemotherapy -resolving. hgb has improved to 11 #5 Left chest wall pain due to costochondritis - mx with pain medications. CTA x 2 neg for PE Plan -labs stable. -patient appropriate to proceed with her next cycle of treatment with maintenance Durvalumab. -counseled regarding importance of daily exercise/activity and continued follow-up with cancer rehabilitation  -DM2 better controlled -Started on levothyroxine for hypothyroidism likely related to her immunotherapy . -We shall start her on levothyroxine 50 g daily .Will need likely higher dose. -Given endocrinology referral to optimize treatment of her hypothyroidism and diabetes . --recommended maintenance of good po hydration and food intake -shall continue Durvalumab maintenance. -rpt CT chest  without contrast prior to her next visit.  #6 Sinus tachycardia - on Cardizem CR , no PE on CTA x 2 Plan -Still having sinus tachycardia . Partly from deconditioning . -Will increase Cardizem CR from 120 mg to 147m po daily -maintain good po hydration. -Follow-up with primary care physician for further evaluation regarding this    #3  neoplasm related pain is currently controlled without significant medications. Clinically likely has sleep apnea . Would need to be careful with sedative medications . Plan -recommend sleep study with PCP  #3 hypertension   #4 diabetes -uncontrolled #5 dyslipidemia #6 asthma Plan - insulin SS for uncontrolled hyperglycemia in the setting of steroid use with chemotherapy. 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 The patient knows to call the clinic with any problems, questions or concerns.  I spent 30 minutes counseling the patient face to face. The total time spent in the appointment was 40 minutes and more than 50% was on counseling and direct patient cares.    GSullivan LoneMD MMolineAAHIVMS SJohn C Stennis Memorial HospitalCOhio County HospitalHematology/Oncology Physician [facility] (Office):       [phone](Work cell):  [phone](Fax):           [phone]

## 2016-08-25 NOTE — Telephone Encounter (Signed)
Added flush appointments qmwf starting today per 5/23 schedule message. Spoke with patient and she will get new schedule for remaining appointments when she comes in today.

## 2016-08-26 ENCOUNTER — Telehealth: Payer: Self-pay | Admitting: *Deleted

## 2016-08-26 NOTE — Telephone Encounter (Signed)
Per Dr. Irene Limbo, records faxed to PCP, Selina Cooley, FNP.  Called facility to verify fax number:  [phone].

## 2016-08-27 NOTE — Telephone Encounter (Signed)
Dr. Dwyane Dee office per Referral does not take medicaid - information per referral given to [place] in [facility] to fax to Dr. Cindra Eves office.

## 2016-09-01 ENCOUNTER — Ambulatory Visit (HOSPITAL_BASED_OUTPATIENT_CLINIC_OR_DEPARTMENT_OTHER): Payer: Medicaid Other

## 2016-09-01 DIAGNOSIS — C3491 Malignant neoplasm of unspecified part of right bronchus or lung: Secondary | ICD-10-CM | POA: Diagnosis not present

## 2016-09-01 DIAGNOSIS — C770 Secondary and unspecified malignant neoplasm of lymph nodes of head, face and neck: Secondary | ICD-10-CM | POA: Diagnosis not present

## 2016-09-01 DIAGNOSIS — Z452 Encounter for adjustment and management of vascular access device: Secondary | ICD-10-CM | POA: Diagnosis not present

## 2016-09-01 DIAGNOSIS — Z95828 Presence of other vascular implants and grafts: Secondary | ICD-10-CM

## 2016-09-01 MED ORDER — HEPARIN SOD (PORK) LOCK FLUSH 100 UNIT/ML IV SOLN
250.0000 [IU] | Freq: Once | INTRAVENOUS | Status: AC
  Administered 2016-09-01: 15:00:00
  Filled 2016-09-01: qty 5

## 2016-09-01 MED ORDER — SODIUM CHLORIDE 0.9% FLUSH
10.0000 mL | Freq: Once | INTRAVENOUS | Status: AC
  Administered 2016-09-01: 10 mL
  Filled 2016-09-01: qty 10

## 2016-09-02 ENCOUNTER — Telehealth: Payer: Self-pay | Admitting: Hematology

## 2016-09-02 NOTE — Telephone Encounter (Signed)
Faxed records to Dr. Buddy Duty

## 2016-09-03 ENCOUNTER — Ambulatory Visit (HOSPITAL_BASED_OUTPATIENT_CLINIC_OR_DEPARTMENT_OTHER): Payer: Medicaid Other

## 2016-09-03 DIAGNOSIS — C3491 Malignant neoplasm of unspecified part of right bronchus or lung: Secondary | ICD-10-CM

## 2016-09-03 DIAGNOSIS — Z452 Encounter for adjustment and management of vascular access device: Secondary | ICD-10-CM

## 2016-09-03 MED ORDER — HEPARIN SOD (PORK) LOCK FLUSH 100 UNIT/ML IV SOLN
250.0000 [IU] | Freq: Once | INTRAVENOUS | Status: AC
  Administered 2016-09-03: 14:00:00
  Filled 2016-09-03: qty 5

## 2016-09-03 MED ORDER — SODIUM CHLORIDE 0.9% FLUSH
10.0000 mL | Freq: Once | INTRAVENOUS | Status: AC
  Administered 2016-09-03: 10 mL
  Filled 2016-09-03: qty 10

## 2016-09-06 ENCOUNTER — Ambulatory Visit (HOSPITAL_BASED_OUTPATIENT_CLINIC_OR_DEPARTMENT_OTHER): Payer: Medicaid Other

## 2016-09-06 ENCOUNTER — Other Ambulatory Visit (HOSPITAL_BASED_OUTPATIENT_CLINIC_OR_DEPARTMENT_OTHER): Payer: Medicaid Other

## 2016-09-06 ENCOUNTER — Ambulatory Visit: Payer: Medicaid Other

## 2016-09-06 VITALS — BP 130/85 | HR 113 | Temp 99.1°F | Resp 18

## 2016-09-06 DIAGNOSIS — Z452 Encounter for adjustment and management of vascular access device: Secondary | ICD-10-CM

## 2016-09-06 DIAGNOSIS — C3491 Malignant neoplasm of unspecified part of right bronchus or lung: Secondary | ICD-10-CM

## 2016-09-06 DIAGNOSIS — C770 Secondary and unspecified malignant neoplasm of lymph nodes of head, face and neck: Secondary | ICD-10-CM | POA: Diagnosis not present

## 2016-09-06 DIAGNOSIS — C3492 Malignant neoplasm of unspecified part of left bronchus or lung: Secondary | ICD-10-CM

## 2016-09-06 DIAGNOSIS — Z5112 Encounter for antineoplastic immunotherapy: Secondary | ICD-10-CM | POA: Diagnosis not present

## 2016-09-06 DIAGNOSIS — Z95828 Presence of other vascular implants and grafts: Secondary | ICD-10-CM

## 2016-09-06 LAB — CBC & DIFF AND RETIC
BASO%: 0.6 % (ref 0.0–2.0)
Basophils Absolute: 0 10*3/uL (ref 0.0–0.1)
EOS ABS: 0.3 10*3/uL (ref 0.0–0.5)
EOS%: 6.3 % (ref 0.0–7.0)
HCT: 32.9 % — ABNORMAL LOW (ref 34.8–46.6)
HGB: 10.5 g/dL — ABNORMAL LOW (ref 11.6–15.9)
Immature Retic Fract: 4.7 % (ref 1.60–10.00)
LYMPH%: 27.4 % (ref 14.0–49.7)
MCH: 27.1 pg (ref 25.1–34.0)
MCHC: 31.9 g/dL (ref 31.5–36.0)
MCV: 84.8 fL (ref 79.5–101.0)
MONO#: 0.3 10*3/uL (ref 0.1–0.9)
MONO%: 5.3 % (ref 0.0–14.0)
NEUT%: 60.4 % (ref 38.4–76.8)
NEUTROS ABS: 3 10*3/uL (ref 1.5–6.5)
Platelets: 268 10*3/uL (ref 145–400)
RBC: 3.88 10*6/uL (ref 3.70–5.45)
RDW: 15.5 % — ABNORMAL HIGH (ref 11.2–14.5)
Retic %: 1.32 % (ref 0.70–2.10)
Retic Ct Abs: 51.22 10*3/uL (ref 33.70–90.70)
WBC: 4.9 10*3/uL (ref 3.9–10.3)
lymph#: 1.3 10*3/uL (ref 0.9–3.3)

## 2016-09-06 LAB — COMPREHENSIVE METABOLIC PANEL
ALT: 11 U/L (ref 0–55)
AST: 20 U/L (ref 5–34)
Albumin: 3.3 g/dL — ABNORMAL LOW (ref 3.5–5.0)
Alkaline Phosphatase: 67 U/L (ref 40–150)
Anion Gap: 8 mEq/L (ref 3–11)
BUN: 8 mg/dL (ref 7.0–26.0)
CHLORIDE: 103 meq/L (ref 98–109)
CO2: 30 mEq/L — ABNORMAL HIGH (ref 22–29)
Calcium: 8.9 mg/dL (ref 8.4–10.4)
Creatinine: 0.8 mg/dL (ref 0.6–1.1)
EGFR: >90 mL/min/{1.73_m2} (ref >90)
GLUCOSE: 116 mg/dL (ref 70–140)
Potassium: 3.8 mEq/L (ref 3.5–5.1)
SODIUM: 140 meq/L (ref 136–145)
Total Bilirubin: 0.29 mg/dL (ref 0.20–1.20)
Total Protein: 7.2 g/dL (ref 6.4–8.3)

## 2016-09-06 MED ORDER — SODIUM CHLORIDE 0.9 % IJ SOLN
10.0000 mL | INTRAMUSCULAR | Status: DC | PRN
  Administered 2016-09-06: 10 mL via INTRAVENOUS
  Filled 2016-09-06: qty 10

## 2016-09-06 MED ORDER — DIPHENHYDRAMINE HCL 25 MG PO CAPS
ORAL_CAPSULE | ORAL | Status: AC
  Filled 2016-09-06: qty 2

## 2016-09-06 MED ORDER — ACETAMINOPHEN 325 MG PO TABS
650.0000 mg | ORAL_TABLET | Freq: Once | ORAL | Status: AC
  Administered 2016-09-06: 650 mg via ORAL

## 2016-09-06 MED ORDER — HEPARIN SOD (PORK) LOCK FLUSH 100 UNIT/ML IV SOLN
250.0000 [IU] | Freq: Once | INTRAVENOUS | Status: AC | PRN
  Administered 2016-09-06: 250 [IU]
  Filled 2016-09-06: qty 5

## 2016-09-06 MED ORDER — DIPHENHYDRAMINE HCL 25 MG PO TABS
25.0000 mg | ORAL_TABLET | Freq: Once | ORAL | Status: AC
  Administered 2016-09-06: 25 mg via ORAL
  Filled 2016-09-06: qty 1

## 2016-09-06 MED ORDER — SODIUM CHLORIDE 0.9% FLUSH
10.0000 mL | INTRAVENOUS | Status: DC | PRN
  Administered 2016-09-06: 10 mL
  Filled 2016-09-06: qty 10

## 2016-09-06 MED ORDER — ACETAMINOPHEN 325 MG PO TABS
ORAL_TABLET | ORAL | Status: AC
  Filled 2016-09-06: qty 2

## 2016-09-06 MED ORDER — SODIUM CHLORIDE 0.9 % IV SOLN
9.6000 mg/kg | Freq: Once | INTRAVENOUS | Status: AC
  Administered 2016-09-06: 1120 mg via INTRAVENOUS
  Filled 2016-09-06: qty 20

## 2016-09-06 MED ORDER — SODIUM CHLORIDE 0.9 % IV SOLN
Freq: Once | INTRAVENOUS | Status: AC
  Administered 2016-09-06: 13:00:00 via INTRAVENOUS

## 2016-09-06 NOTE — Patient Instructions (Signed)
[facility] Discharge Instructions for Patients Receiving Chemotherapy  Today you received the following chemotherapy agents: Imfinzi To help prevent nausea and vomiting after your treatment, we encourage you to take your nausea medication as directed.    If you develop nausea and vomiting that is not controlled by your nausea medication, call the clinic.   BELOW ARE SYMPTOMS THAT SHOULD BE REPORTED IMMEDIATELY:  *FEVER GREATER THAN 100.5 F  *CHILLS WITH OR WITHOUT FEVER  NAUSEA AND VOMITING THAT IS NOT CONTROLLED WITH YOUR NAUSEA MEDICATION  *UNUSUAL SHORTNESS OF BREATH  *UNUSUAL BRUISING OR BLEEDING  TENDERNESS IN MOUTH AND THROAT WITH OR WITHOUT PRESENCE OF ULCERS  *URINARY PROBLEMS  *BOWEL PROBLEMS  UNUSUAL RASH Items with * indicate a potential emergency and should be followed up as soon as possible.  Feel free to call the clinic you have any questions or concerns. The clinic phone number is [phone].  Please show the [place] at check-in to the Emergency Department and triage nurse.

## 2016-09-06 NOTE — Progress Notes (Signed)
1257: MD aware of VS, okay to proceed with treatment per Dr. Irene Limbo.

## 2016-09-08 ENCOUNTER — Telehealth: Payer: Self-pay | Admitting: Hematology

## 2016-09-08 ENCOUNTER — Ambulatory Visit (HOSPITAL_BASED_OUTPATIENT_CLINIC_OR_DEPARTMENT_OTHER): Payer: Medicaid Other

## 2016-09-08 ENCOUNTER — Other Ambulatory Visit: Payer: Self-pay | Admitting: *Deleted

## 2016-09-08 DIAGNOSIS — Z452 Encounter for adjustment and management of vascular access device: Secondary | ICD-10-CM | POA: Diagnosis present

## 2016-09-08 DIAGNOSIS — C770 Secondary and unspecified malignant neoplasm of lymph nodes of head, face and neck: Secondary | ICD-10-CM

## 2016-09-08 DIAGNOSIS — C3491 Malignant neoplasm of unspecified part of right bronchus or lung: Secondary | ICD-10-CM | POA: Diagnosis not present

## 2016-09-08 DIAGNOSIS — Z95828 Presence of other vascular implants and grafts: Secondary | ICD-10-CM

## 2016-09-08 MED ORDER — HEPARIN SOD (PORK) LOCK FLUSH 100 UNIT/ML IV SOLN
250.0000 [IU] | Freq: Once | INTRAVENOUS | Status: AC
  Administered 2016-09-08: 250 [IU]
  Filled 2016-09-08: qty 5

## 2016-09-08 MED ORDER — SODIUM CHLORIDE 0.9% FLUSH
10.0000 mL | Freq: Once | INTRAVENOUS | Status: AC
  Administered 2016-09-08: 10 mL
  Filled 2016-09-08: qty 10

## 2016-09-08 NOTE — Patient Instructions (Signed)
PICC Home Guide °A peripherally inserted central catheter (PICC) is a long, thin, flexible tube that is inserted into a vein in the upper arm. It is a form of intravenous (IV) access. It is considered to be a "central" line because the tip of the PICC ends in a large vein in your chest. This large vein is called the superior vena cava (SVC). The PICC tip ends in the SVC because there is a lot of blood flow in the SVC. This allows medicines and IV fluids to be quickly distributed throughout the body. The PICC is inserted using a sterile technique by a specially trained nurse or physician. After the PICC is inserted, a chest X-ray exam is done to be sure it is in the correct place. °A PICC may be placed for different reasons, such as: °· To give medicines and liquid nutrition that can only be given through a central line. Examples are: °? Certain antibiotic treatments. °? Chemotherapy. °? Total parenteral nutrition (TPN). °· To take frequent blood samples. °· To give IV fluids and blood products. °· If there is difficulty placing a peripheral intravenous (PIV) catheter. ° °If taken care of properly, a PICC can remain in place for several months. A PICC can also allow a person to go home from the hospital early. Medicine and PICC care can be managed at home by a family member or home health care team. °What problems can happen when I have a PICC? °Problems with a PICC can occasionally occur. These may include the following: °· A blood clot (thrombus) forming in or at the tip of the PICC. This can cause the PICC to become clogged. A clot-dissolving medicine called tissue plasminogen activator (tPA) can be given through the PICC to help break up the clot. °· Inflammation of the vein (phlebitis) in which the PICC is placed. Signs of inflammation may include redness, pain at the insertion site, red streaks, or being able to feel a "cord" in the vein where the PICC is located. °· Infection in the PICC or at the insertion  site. Signs of infection may include fever, chills, redness, swelling, or pus drainage from the PICC insertion site. °· PICC movement (malposition). The PICC tip may move from its original position due to excessive physical activity, forceful coughing, sneezing, or vomiting. °· A break or cut in the PICC. It is important to not use scissors near the PICC. °· Nerve or tendon irritation or injury during PICC insertion. ° °What should I keep in mind about activities when I have a PICC? °· You may bend your arm and move it freely. If your PICC is near or at the bend of your elbow, avoid activity with repeated motion at the elbow. °· Rest at home for the remainder of the day following PICC line insertion. °· Avoid lifting heavy objects as instructed by your health care provider. °· Avoid using a crutch with the arm on the same side as your PICC. You may need to use a walker. °What should I know about my PICC dressing? °· Keep your PICC bandage (dressing) clean and dry to prevent infection. °? Ask your health care provider when you may shower. Ask your health care provider to teach you how to wrap the PICC when you do take a shower. °· Change the PICC dressing as instructed by your health care provider. °· Change your PICC dressing if it becomes loose or wet. °What should I know about PICC care? °· Check the PICC insertion   site daily for leakage, redness, swelling, or pain. °· Do not take a bath, swim, or use hot tubs when you have a PICC. Cover PICC line with clear plastic wrap and tape to keep it dry while showering. °· Flush the PICC as directed by your health care provider. Let your health care provider know right away if the PICC is difficult to flush or does not flush. Do not use force to flush the PICC. °· Do not use a syringe that is less than 10 mL to flush the PICC. °· Never pull or tug on the PICC. °· Avoid blood pressure checks on the arm with the PICC. °· Keep your PICC identification card with you at all  times. °· Do not take the PICC out yourself. Only a trained clinical professional should remove the PICC. °Get help right away if: °· Your PICC is accidentally pulled all the way out. If this happens, cover the insertion site with a bandage or gauze dressing. Do not throw the PICC away. Your health care provider will need to inspect it. °· Your PICC was tugged or pulled and has partially come out. Do not  push the PICC back in. °· There is any type of drainage, redness, or swelling where the PICC enters the skin. °· You cannot flush the PICC, it is difficult to flush, or the PICC leaks around the insertion site when it is flushed. °· You hear a "flushing" sound when the PICC is flushed. °· You have pain, discomfort, or numbness in your arm, shoulder, or jaw on the same side as the PICC. °· You feel your heart "racing" or skipping beats. °· You notice a hole or tear in the PICC. °· You develop chills or a fever. °This information is not intended to replace advice given to you by your health care provider. Make sure you discuss any questions you have with your health care provider. °Document Released: 09/26/2002 Document Revised: 10/10/2015 Document Reviewed: 01/12/2013 °Elsevier Interactive Patient Education © 2017 [employer] ° °

## 2016-09-08 NOTE — Telephone Encounter (Signed)
R/s appt per patient - unable to make 2 pm appt

## 2016-09-10 ENCOUNTER — Ambulatory Visit (HOSPITAL_BASED_OUTPATIENT_CLINIC_OR_DEPARTMENT_OTHER): Payer: Medicaid Other

## 2016-09-10 DIAGNOSIS — C3491 Malignant neoplasm of unspecified part of right bronchus or lung: Secondary | ICD-10-CM

## 2016-09-10 DIAGNOSIS — C770 Secondary and unspecified malignant neoplasm of lymph nodes of head, face and neck: Secondary | ICD-10-CM | POA: Diagnosis not present

## 2016-09-10 DIAGNOSIS — Z452 Encounter for adjustment and management of vascular access device: Secondary | ICD-10-CM

## 2016-09-10 DIAGNOSIS — Z95828 Presence of other vascular implants and grafts: Secondary | ICD-10-CM

## 2016-09-10 MED ORDER — SODIUM CHLORIDE 0.9% FLUSH
10.0000 mL | Freq: Once | INTRAVENOUS | Status: AC
Start: 2016-09-10 — End: 2016-09-10
  Administered 2016-09-10: 10 mL
  Filled 2016-09-10: qty 10

## 2016-09-10 MED ORDER — HEPARIN SOD (PORK) LOCK FLUSH 100 UNIT/ML IV SOLN
250.0000 [IU] | Freq: Once | INTRAVENOUS | Status: AC
  Administered 2016-09-10: 250 [IU]
  Filled 2016-09-10: qty 5

## 2016-09-10 NOTE — Progress Notes (Signed)
Patient reported to the flush room to have picc flushed. Dressing was damaged during a shower yesterday. Bio Patch was full of water and dressing rolled down to the stat lock. Site non edematous, no drainage from insertion site, no edema. Dressing changed, new biopatch was applied. Pt tolerated dressing change without complaints of pain. Flush with saline was completed, without blood return. Charge nurse contacted and ordered to instill heparin and she will be rechecked on Monday.

## 2016-09-13 ENCOUNTER — Ambulatory Visit (HOSPITAL_COMMUNITY)
Admission: RE | Admit: 2016-09-13 | Discharge: 2016-09-13 | Disposition: A | Discharge status: Home / Self Care | Payer: Medicaid Other | Source: Ambulatory Visit | Attending: Hematology | Admitting: Hematology

## 2016-09-13 ENCOUNTER — Ambulatory Visit (HOSPITAL_BASED_OUTPATIENT_CLINIC_OR_DEPARTMENT_OTHER): Payer: Medicaid Other

## 2016-09-13 ENCOUNTER — Encounter (HOSPITAL_COMMUNITY): Payer: Self-pay

## 2016-09-13 DIAGNOSIS — C3492 Malignant neoplasm of unspecified part of left bronchus or lung: Secondary | ICD-10-CM | POA: Diagnosis present

## 2016-09-13 DIAGNOSIS — C770 Secondary and unspecified malignant neoplasm of lymph nodes of head, face and neck: Secondary | ICD-10-CM

## 2016-09-13 DIAGNOSIS — C3491 Malignant neoplasm of unspecified part of right bronchus or lung: Secondary | ICD-10-CM

## 2016-09-13 DIAGNOSIS — Z452 Encounter for adjustment and management of vascular access device: Secondary | ICD-10-CM | POA: Diagnosis present

## 2016-09-13 MED ORDER — SODIUM CHLORIDE 0.9% FLUSH
10.0000 mL | Freq: Once | INTRAVENOUS | Status: AC
  Administered 2016-09-13: 10 mL
  Filled 2016-09-13: qty 10

## 2016-09-13 MED ORDER — HEPARIN SOD (PORK) LOCK FLUSH 100 UNIT/ML IV SOLN
250.0000 [IU] | Freq: Once | INTRAVENOUS | Status: AC
  Administered 2016-09-13: 14:00:00
  Filled 2016-09-13: qty 5

## 2016-09-15 ENCOUNTER — Ambulatory Visit (HOSPITAL_BASED_OUTPATIENT_CLINIC_OR_DEPARTMENT_OTHER): Payer: Medicaid Other

## 2016-09-15 DIAGNOSIS — C3491 Malignant neoplasm of unspecified part of right bronchus or lung: Secondary | ICD-10-CM | POA: Diagnosis not present

## 2016-09-15 DIAGNOSIS — Z95828 Presence of other vascular implants and grafts: Secondary | ICD-10-CM

## 2016-09-15 DIAGNOSIS — Z452 Encounter for adjustment and management of vascular access device: Secondary | ICD-10-CM

## 2016-09-15 MED ORDER — SODIUM CHLORIDE 0.9 % IJ SOLN
10.0000 mL | INTRAMUSCULAR | Status: DC | PRN
  Administered 2016-09-15: 10 mL via INTRAVENOUS
  Filled 2016-09-15: qty 10

## 2016-09-15 MED ORDER — HEPARIN SOD (PORK) LOCK FLUSH 100 UNIT/ML IV SOLN
500.0000 [IU] | Freq: Once | INTRAVENOUS | Status: AC | PRN
  Administered 2016-09-15: 500 [IU] via INTRAVENOUS
  Filled 2016-09-15: qty 5

## 2016-09-15 NOTE — Patient Instructions (Signed)
PICC Home Guide °A peripherally inserted central catheter (PICC) is a long, thin, flexible tube that is inserted into a vein in the upper arm. It is a form of intravenous (IV) access. It is considered to be a "central" line because the tip of the PICC ends in a large vein in your chest. This large vein is called the superior vena cava (SVC). The PICC tip ends in the SVC because there is a lot of blood flow in the SVC. This allows medicines and IV fluids to be quickly distributed throughout the body. The PICC is inserted using a sterile technique by a specially trained nurse or physician. After the PICC is inserted, a chest X-ray exam is done to be sure it is in the correct place. °A PICC may be placed for different reasons, such as: °· To give medicines and liquid nutrition that can only be given through a central line. Examples are: °? Certain antibiotic treatments. °? Chemotherapy. °? Total parenteral nutrition (TPN). °· To take frequent blood samples. °· To give IV fluids and blood products. °· If there is difficulty placing a peripheral intravenous (PIV) catheter. ° °If taken care of properly, a PICC can remain in place for several months. A PICC can also allow a person to go home from the hospital early. Medicine and PICC care can be managed at home by a family member or home health care team. °What problems can happen when I have a PICC? °Problems with a PICC can occasionally occur. These may include the following: °· A blood clot (thrombus) forming in or at the tip of the PICC. This can cause the PICC to become clogged. A clot-dissolving medicine called tissue plasminogen activator (tPA) can be given through the PICC to help break up the clot. °· Inflammation of the vein (phlebitis) in which the PICC is placed. Signs of inflammation may include redness, pain at the insertion site, red streaks, or being able to feel a "cord" in the vein where the PICC is located. °· Infection in the PICC or at the insertion  site. Signs of infection may include fever, chills, redness, swelling, or pus drainage from the PICC insertion site. °· PICC movement (malposition). The PICC tip may move from its original position due to excessive physical activity, forceful coughing, sneezing, or vomiting. °· A break or cut in the PICC. It is important to not use scissors near the PICC. °· Nerve or tendon irritation or injury during PICC insertion. ° °What should I keep in mind about activities when I have a PICC? °· You may bend your arm and move it freely. If your PICC is near or at the bend of your elbow, avoid activity with repeated motion at the elbow. °· Rest at home for the remainder of the day following PICC line insertion. °· Avoid lifting heavy objects as instructed by your health care provider. °· Avoid using a crutch with the arm on the same side as your PICC. You may need to use a walker. °What should I know about my PICC dressing? °· Keep your PICC bandage (dressing) clean and dry to prevent infection. °? Ask your health care provider when you may shower. Ask your health care provider to teach you how to wrap the PICC when you do take a shower. °· Change the PICC dressing as instructed by your health care provider. °· Change your PICC dressing if it becomes loose or wet. °What should I know about PICC care? °· Check the PICC insertion   site daily for leakage, redness, swelling, or pain. °· Do not take a bath, swim, or use hot tubs when you have a PICC. Cover PICC line with clear plastic wrap and tape to keep it dry while showering. °· Flush the PICC as directed by your health care provider. Let your health care provider know right away if the PICC is difficult to flush or does not flush. Do not use force to flush the PICC. °· Do not use a syringe that is less than 10 mL to flush the PICC. °· Never pull or tug on the PICC. °· Avoid blood pressure checks on the arm with the PICC. °· Keep your PICC identification card with you at all  times. °· Do not take the PICC out yourself. Only a trained clinical professional should remove the PICC. °Get help right away if: °· Your PICC is accidentally pulled all the way out. If this happens, cover the insertion site with a bandage or gauze dressing. Do not throw the PICC away. Your health care provider will need to inspect it. °· Your PICC was tugged or pulled and has partially come out. Do not  push the PICC back in. °· There is any type of drainage, redness, or swelling where the PICC enters the skin. °· You cannot flush the PICC, it is difficult to flush, or the PICC leaks around the insertion site when it is flushed. °· You hear a "flushing" sound when the PICC is flushed. °· You have pain, discomfort, or numbness in your arm, shoulder, or jaw on the same side as the PICC. °· You feel your heart "racing" or skipping beats. °· You notice a hole or tear in the PICC. °· You develop chills or a fever. °This information is not intended to replace advice given to you by your health care provider. Make sure you discuss any questions you have with your health care provider. °Document Released: 09/26/2002 Document Revised: 10/10/2015 Document Reviewed: 01/12/2013 °Elsevier Interactive Patient Education © 2017 [employer] ° °

## 2016-09-17 ENCOUNTER — Ambulatory Visit (HOSPITAL_BASED_OUTPATIENT_CLINIC_OR_DEPARTMENT_OTHER): Payer: Medicaid Other

## 2016-09-17 DIAGNOSIS — C770 Secondary and unspecified malignant neoplasm of lymph nodes of head, face and neck: Secondary | ICD-10-CM

## 2016-09-17 DIAGNOSIS — Z452 Encounter for adjustment and management of vascular access device: Secondary | ICD-10-CM

## 2016-09-17 DIAGNOSIS — C3491 Malignant neoplasm of unspecified part of right bronchus or lung: Secondary | ICD-10-CM | POA: Diagnosis not present

## 2016-09-17 MED ORDER — SODIUM CHLORIDE 0.9% FLUSH
10.0000 mL | Freq: Once | INTRAVENOUS | Status: AC
  Administered 2016-09-17: 10 mL
  Filled 2016-09-17: qty 10

## 2016-09-17 MED ORDER — HEPARIN SOD (PORK) LOCK FLUSH 100 UNIT/ML IV SOLN
250.0000 [IU] | Freq: Once | INTRAVENOUS | Status: AC
  Administered 2016-09-17: 15:00:00
  Filled 2016-09-17: qty 5

## 2016-09-20 ENCOUNTER — Ambulatory Visit: Payer: Medicaid Other

## 2016-09-20 ENCOUNTER — Ambulatory Visit (HOSPITAL_BASED_OUTPATIENT_CLINIC_OR_DEPARTMENT_OTHER): Payer: Medicaid Other | Admitting: Hematology

## 2016-09-20 ENCOUNTER — Telehealth: Payer: Self-pay | Admitting: Hematology

## 2016-09-20 ENCOUNTER — Encounter: Payer: Self-pay | Admitting: Hematology

## 2016-09-20 ENCOUNTER — Other Ambulatory Visit (HOSPITAL_BASED_OUTPATIENT_CLINIC_OR_DEPARTMENT_OTHER): Payer: Medicaid Other

## 2016-09-20 ENCOUNTER — Ambulatory Visit (HOSPITAL_BASED_OUTPATIENT_CLINIC_OR_DEPARTMENT_OTHER): Payer: Medicaid Other

## 2016-09-20 VITALS — BP 129/91 | HR 115 | Temp 98.4°F | Resp 18 | Ht 69.0 in | Wt 269.3 lb

## 2016-09-20 DIAGNOSIS — C3492 Malignant neoplasm of unspecified part of left bronchus or lung: Secondary | ICD-10-CM

## 2016-09-20 DIAGNOSIS — C770 Secondary and unspecified malignant neoplasm of lymph nodes of head, face and neck: Secondary | ICD-10-CM

## 2016-09-20 DIAGNOSIS — K5900 Constipation, unspecified: Secondary | ICD-10-CM

## 2016-09-20 DIAGNOSIS — E1140 Type 2 diabetes mellitus with diabetic neuropathy, unspecified: Secondary | ICD-10-CM

## 2016-09-20 DIAGNOSIS — E119 Type 2 diabetes mellitus without complications: Secondary | ICD-10-CM | POA: Diagnosis not present

## 2016-09-20 DIAGNOSIS — D6481 Anemia due to antineoplastic chemotherapy: Secondary | ICD-10-CM

## 2016-09-20 DIAGNOSIS — E279 Disorder of adrenal gland, unspecified: Secondary | ICD-10-CM

## 2016-09-20 DIAGNOSIS — G893 Neoplasm related pain (acute) (chronic): Secondary | ICD-10-CM

## 2016-09-20 DIAGNOSIS — C3491 Malignant neoplasm of unspecified part of right bronchus or lung: Secondary | ICD-10-CM

## 2016-09-20 DIAGNOSIS — I10 Essential (primary) hypertension: Secondary | ICD-10-CM

## 2016-09-20 DIAGNOSIS — J700 Acute pulmonary manifestations due to radiation: Secondary | ICD-10-CM

## 2016-09-20 DIAGNOSIS — E039 Hypothyroidism, unspecified: Secondary | ICD-10-CM

## 2016-09-20 DIAGNOSIS — Z5111 Encounter for antineoplastic chemotherapy: Secondary | ICD-10-CM

## 2016-09-20 DIAGNOSIS — T451X5A Adverse effect of antineoplastic and immunosuppressive drugs, initial encounter: Secondary | ICD-10-CM

## 2016-09-20 DIAGNOSIS — C3490 Malignant neoplasm of unspecified part of unspecified bronchus or lung: Secondary | ICD-10-CM

## 2016-09-20 DIAGNOSIS — R000 Tachycardia, unspecified: Secondary | ICD-10-CM | POA: Diagnosis not present

## 2016-09-20 DIAGNOSIS — G620 Drug-induced polyneuropathy: Secondary | ICD-10-CM

## 2016-09-20 LAB — CBC & DIFF AND RETIC
BASO%: 0.4 % (ref 0.0–2.0)
BASOS ABS: 0 10*3/uL (ref 0.0–0.1)
EOS ABS: 0.3 10*3/uL (ref 0.0–0.5)
EOS%: 5.9 % (ref 0.0–7.0)
HEMATOCRIT: 32.7 % — AB (ref 34.8–46.6)
HEMOGLOBIN: 10.4 g/dL — AB (ref 11.6–15.9)
IMMATURE RETIC FRACT: 5.2 % (ref 1.60–10.00)
LYMPH#: 1.5 10*3/uL (ref 0.9–3.3)
LYMPH%: 26.1 % (ref 14.0–49.7)
MCH: 27.4 pg (ref 25.1–34.0)
MCHC: 31.8 g/dL (ref 31.5–36.0)
MCV: 86.3 fL (ref 79.5–101.0)
MONO#: 0.2 10*3/uL (ref 0.1–0.9)
MONO%: 4.3 % (ref 0.0–14.0)
NEUT#: 3.6 10*3/uL (ref 1.5–6.5)
NEUT%: 63.3 % (ref 38.4–76.8)
PLATELETS: 271 10*3/uL (ref 145–400)
RBC: 3.79 10*6/uL (ref 3.70–5.45)
RDW: 15.3 % — ABNORMAL HIGH (ref 11.2–14.5)
RETIC CT ABS: 56.47 10*3/uL (ref 33.70–90.70)
Retic %: 1.49 % (ref 0.70–2.10)
WBC: 5.6 10*3/uL (ref 3.9–10.3)

## 2016-09-20 LAB — COMPREHENSIVE METABOLIC PANEL
ALBUMIN: 3.4 g/dL — AB (ref 3.5–5.0)
ALK PHOS: 73 U/L (ref 40–150)
ALT: 13 U/L (ref 0–55)
ANION GAP: 6 meq/L (ref 3–11)
AST: 20 U/L (ref 5–34)
BILIRUBIN TOTAL: 0.23 mg/dL (ref 0.20–1.20)
BUN: 9 mg/dL (ref 7.0–26.0)
CO2: 29 meq/L (ref 22–29)
Calcium: 9.3 mg/dL (ref 8.4–10.4)
Chloride: 103 mEq/L (ref 98–109)
Creatinine: 0.8 mg/dL (ref 0.6–1.1)
EGFR: >90 mL/min/{1.73_m2} (ref >90)
Glucose: 171 mg/dl — ABNORMAL HIGH (ref 70–140)
Potassium: 4 mEq/L (ref 3.5–5.1)
Sodium: 138 mEq/L (ref 136–145)
TOTAL PROTEIN: 7.4 g/dL (ref 6.4–8.3)

## 2016-09-20 MED ORDER — HEPARIN SOD (PORK) LOCK FLUSH 100 UNIT/ML IV SOLN
500.0000 [IU] | Freq: Once | INTRAVENOUS | Status: AC | PRN
  Administered 2016-09-20: 500 [IU]
  Filled 2016-09-20: qty 5

## 2016-09-20 MED ORDER — OXYCODONE-ACETAMINOPHEN 5-325 MG PO TABS: 1.0000 | ORAL_TABLET | Freq: Four times a day (QID) | ORAL | 0 refills | Status: DC | PRN

## 2016-09-20 MED ORDER — DIPHENHYDRAMINE HCL 25 MG PO CAPS
ORAL_CAPSULE | ORAL | Status: AC
  Filled 2016-09-20: qty 2

## 2016-09-20 MED ORDER — SODIUM CHLORIDE 0.9% FLUSH
10.0000 mL | INTRAVENOUS | Status: DC | PRN
  Administered 2016-09-20: 10 mL
  Filled 2016-09-20: qty 10

## 2016-09-20 MED ORDER — ACETAMINOPHEN 325 MG PO TABS
ORAL_TABLET | ORAL | Status: AC
Start: 2016-09-20 — End: 2016-09-20
  Filled 2016-09-20: qty 2

## 2016-09-20 MED ORDER — DULOXETINE HCL 60 MG PO CPEP: 60.0000 mg | ORAL_CAPSULE | Freq: Every day | ORAL | 2 refills | Status: DC

## 2016-09-20 MED ORDER — ACETAMINOPHEN 325 MG PO TABS
650.0000 mg | ORAL_TABLET | Freq: Once | ORAL | Status: AC
  Administered 2016-09-20: 650 mg via ORAL

## 2016-09-20 MED ORDER — SODIUM CHLORIDE 0.9 % IV SOLN
9.6000 mg/kg | Freq: Once | INTRAVENOUS | Status: AC
  Administered 2016-09-20: 1120 mg via INTRAVENOUS
  Filled 2016-09-20: qty 20

## 2016-09-20 MED ORDER — SODIUM CHLORIDE 0.9 % IV SOLN
Freq: Once | INTRAVENOUS | Status: AC
  Administered 2016-09-20: 15:00:00 via INTRAVENOUS

## 2016-09-20 MED ORDER — SODIUM CHLORIDE 0.9 % IJ SOLN
10.0000 mL | Freq: Once | INTRAMUSCULAR | Status: AC
  Administered 2016-09-20: 10 mL via INTRAVENOUS
  Filled 2016-09-20: qty 10

## 2016-09-20 MED ORDER — DIPHENHYDRAMINE HCL 25 MG PO TABS
25.0000 mg | ORAL_TABLET | Freq: Once | ORAL | Status: AC
  Administered 2016-09-20: 25 mg via ORAL
  Filled 2016-09-20: qty 1

## 2016-09-20 NOTE — Patient Instructions (Signed)
   [facility] Discharge Instructions for Patients Receiving Chemotherapy  Today you received the following chemotherapy agents Imfinzi  To help prevent nausea and vomiting after your treatment, we encourage you to take your nausea medication    If you develop nausea and vomiting that is not controlled by your nausea medication, call the clinic.   BELOW ARE SYMPTOMS THAT SHOULD BE REPORTED IMMEDIATELY:  *FEVER GREATER THAN 100.5 F  *CHILLS WITH OR WITHOUT FEVER  NAUSEA AND VOMITING THAT IS NOT CONTROLLED WITH YOUR NAUSEA MEDICATION  *UNUSUAL SHORTNESS OF BREATH  *UNUSUAL BRUISING OR BLEEDING  TENDERNESS IN MOUTH AND THROAT WITH OR WITHOUT PRESENCE OF ULCERS  *URINARY PROBLEMS  *BOWEL PROBLEMS  UNUSUAL RASH Items with * indicate a potential emergency and should be followed up as soon as possible.  Feel free to call the clinic you have any questions or concerns. The clinic phone number is [phone].  Please show the [place] at check-in to the Emergency Department and triage nurse.

## 2016-09-20 NOTE — Telephone Encounter (Signed)
Scheduled appt per 6/18 los - patient aware and will pick up new schedule next flush appt 6/20

## 2016-09-20 NOTE — Progress Notes (Signed)
[name]    HEMATOLOGY/ONCOLOGY CLINIC NOTE  Date of Service: .09/20/2016  Patient Care Team: [name], FNP as PCP - General (Nurse Practitioner)  CHIEF COMPLAINTS/PURPOSE OF CONSULTATION:  f/u Lung Adenocarcinoma.  Diagnosis Stage IIIB Lung Adenocarcinoma  Current treatment: -Starting Maintain Durvalumab from today 05/24/2016  PreviousTreatment -Concurrent Chemo-radiation with Carboplatin/Taxol. Completed RT on 02/13/2016 and has then completed 2 cycles of carboplatin + taxol.  HISTORY OF PRESENTING ILLNESS: Plz see my previous note for details on initial presentation.  INTERVAL HISTORY  Patient is here for her scheduled followup for prior to next dose of maintenance Durvalumab for her stage IIIB lung adenocarcinoma.  She has been following with cancer rehabilitation and notes that she is functioning better and her fatigue is improved. She notes that her blood sugars have been better and her energy level has improved. No acute new symptoms. Follow-up CT scan of the chest from 09/13/2016 shows stable examination with no new or progressive findings. Has been taking her levothyroxine regularly. Has not been staying as physically active as she could. Neuropathy is still bothersome - Cymbalta dose increased to 60 mg po daily and given refill on prn percocet.   MEDICAL HISTORY:  Past Medical History:  Diagnosis Date  . Asthma   . Diabetes mellitus   . Hypercholesteremia   . Hypertension   . Lung cancer ([place])   . Neuropathy   . Obese     SURGICAL HISTORY: Past Surgical History:  Procedure Laterality Date  . CESAREAN SECTION    . IR GENERIC HISTORICAL  01/08/2016   IR US GUIDE VASC ACCESS RIGHT 01/08/2016 [name], DO [facility]  . IR GENERIC HISTORICAL  01/08/2016   IR FLUORO GUIDE CV LINE RIGHT 01/08/2016 [name], DO [facility]  . TUBAL LIGATION    . US guided core needle biopsy     of right lower neck/supraclavicular lymph nodes    SOCIAL  HISTORY: Social History   Social History  . Marital status: Divorced    Spouse name: N/A  . Number of children: N/A  . Years of education: N/A   Occupational History  . Not on file.   Social History Main Topics  . Smoking status: Never Smoker  . Smokeless tobacco: Never Used  . Alcohol use No  . Drug use: No  . Sexual activity: Not Currently   Other Topics Concern  . Not on file   Social History Narrative   No bird or mold exposure. No recent travel.    FAMILY HISTORY: Family History  Problem Relation Age of Onset  . Cancer Neg Hx   . Rheumatologic disease Neg Hx     ALLERGIES:  has No Known Allergies.  MEDICATIONS:  Current Outpatient Prescriptions  Medication Sig Dispense Refill  . aspirin EC 81 MG tablet Take 1 tablet (81 mg total) by mouth daily. 60 tablet 2  . diltiazem (CARDIZEM CD) 180 MG 24 hr capsule Take 1 capsule (180 mg total) by mouth daily. 30 capsule 1  . DULoxetine (CYMBALTA) 60 MG capsule Take 1 capsule (60 mg total) by mouth daily. 30 capsule 2  . Fluocinonide 0.1 % CREA Apply 1 application topically daily.     . insulin aspart (NOVOLOG) 100 UNIT/ML FlexPen As per sliding scale instructions (Patient taking differently: Inject 0-20 Units into the skin 3 (three) times daily as needed for high blood sugar. As per sliding scale instructions) 15 mL 2  . Insulin Degludec (TRESIBA FLEXTOUCH) 200 UNIT/ML SOPN Inject 100  Units into the skin daily before breakfast.     . levothyroxine (SYNTHROID) 50 MCG tablet Take 1 tablet (50 mcg total) by mouth daily before breakfast. 30 tablet 1  . lidocaine-prilocaine (EMLA) cream Apply to affected area once 30 g 3  . oxyCODONE-acetaminophen (PERCOCET/ROXICET) 5-325 MG tablet Take 1-2 tablets by mouth every 6 (six) hours as needed for moderate pain or severe pain. 40 tablet 0  . polyethylene glycol (MIRALAX) packet Take 17 g by mouth daily. 30 each 1  . senna-docusate (SENNA S) 8.6-50 MG tablet Take 2 tablets by mouth 2  (two) times daily. May reduce to 2 tab po HS as needed once regular BM estabished 60 tablet 1  . Wound Dressings (SONAFINE) Apply 1 application topically 2 (two) times daily.     No current facility-administered medications for this visit.    Facility-Administered Medications Ordered in Other Visits  Medication Dose Route Frequency Provider Last Rate Last Dose  . 0.9 %  sodium chloride infusion   Intravenous Once [name], MD      . acetaminophen (TYLENOL) tablet 650 mg  650 mg Oral Once [name], MD      . diphenhydrAMINE (BENADRYL) tablet 25 mg  25 mg Oral Once [name], MD      . durvalumab [facility]) 1,180 mg in sodium chloride 0.9 % 100 mL chemo infusion  10 mg/kg (Treatment Plan Recorded) Intravenous Once [name], MD      . heparin lock flush 100 unit/mL  500 Units Intracatheter Once PRN [name], MD      . sodium chloride 0.9 % injection 10 mL  10 mL Intravenous PRN [name], MD   10 mL at 05/24/16 1252  . sodium chloride flush (NS) 0.9 % injection 10 mL  10 mL Intracatheter PRN [name], MD        REVIEW OF SYSTEMS:    10 Point review of Systems was done is negative except as noted above.  PHYSICAL EXAMINATION: ECOG PERFORMANCE STATUS: 1 - Symptomatic but completely ambulatory  . Vitals:   09/20/16 1421  BP: (!) 129/91  Pulse: (!) 115  Resp: 18  Temp: 98.4 F (36.9 C)   Filed Weights   09/20/16 1421  Weight: 269 lb 4.8 oz (122.2 kg)   .Body mass index is 39.77 kg/m.  GENERAL:alert, in no acute distress and comfortable SKIN: radiation related skin injury over neck - healing EYES: normal, conjunctiva are pink and non-injected, sclera clear OROPHARYNX:oral thrush noted NECK: supple, no JVD, thyroid normal size, non-tender, without nodularity LYMPH:  no palpable lymphadenopathy in the cervical, axillary or inguinal.             LUNGS: clear to auscultation with normal respiratory  effort. Distant breath sounds  HEART: regular rate & rhythm,  no murmurs and no lower extremity edema ABDOMEN: abdomen obese, soft, non-tender, normoactive bowel sounds  Musculoskeletal: no cyanosis of digits and no clubbing  PSYCH: alert & oriented x 3 with fluent speech NEURO: no focal motor/sensory deficits  LABORATORY DATA: .  [name] CBC Latest Ref Rng & Units 09/20/2016 09/06/2016 08/23/2016  WBC 3.9 - 10.3 10e3/uL 5.6 4.9 6.0  Hemoglobin 11.6 - 15.9 g/dL 10.4(L) 10.5(L) 11.0(L)  Hematocrit 34.8 - 46.6 % 32.7(L) 32.9(L) 34.4(L)  Platelets 145 - 400 10e3/uL 271 268 293   . CMP Latest Ref Rng & Units 09/20/2016 09/06/2016 08/23/2016  Glucose 70 - 140 mg/dl 171(H) 116 247(H)  BUN  7.0 - 26.0 mg/dL 9.0 8.0 10.3  Creatinine 0.6 - 1.1 mg/dL 0.8 0.8 0.8  Sodium 136 - 145 mEq/L 138 140 140  Potassium 3.5 - 5.1 mEq/L 4.0 3.8 3.7  Chloride 101 - 111 mmol/L - - -  CO2 22 - 29 mEq/L 29 30(H) 27  Calcium 8.4 - 10.4 mg/dL 9.3 8.9 9.1  Total Protein 6.4 - 8.3 g/dL 7.4 7.2 7.7  Total Bilirubin 0.20 - 1.20 mg/dL 0.23 0.29 0.28  Alkaline Phos 40 - 150 U/L 73 67 82  AST 5 - 34 U/L _0 ALT 0 - 55 U/L _1 RADIOGRAPHIC STUDIES: No new studies.  ASSESSMENT & PLAN:   51 year old African-American female with history of hypertension, diabetes, asthma, dysuria mass and morbid obesity with   #1 Lung Adenocarcinoma Rt sided atleast Stage IIIB with large right paratracheal mass with mediastinal adenopathy that appears to have grown significantly over the last 6-7 months and rt supraclavicular LN +.   Noted to have a small mass in the left adrenal on CT but PET/CT neg for metastatic disease. Patient has been a lifelong nonsmoker  MRI of the brain was negative for any metastatic disease.  High PDL1 expression (90%) on foundation One Neg for EGFR, ALK, ROS-1 and BRAF mutations.  Patient has completed her planned definitive chemoradiation with carbo Taxol on 02/13/2016.  No prohibitive toxicities other than some grade 1 skin desquamation and some grade 1-2 radiation esophagitis. She has subsequently received 2 out of 2 planned dose of carboplatin + Taxol.  Patient has completed 7 doses of maintenance  Durvalumab CT chest 09/13/2016 -stable with no evidence of lung cancer progression at this time.  #2 s/p pneumonia/radiation pneumonitis . #3 Improving fatigue (grade 1) -working with PT once weekly and encourage to walk as much as possible and optimize DM2 management and Mx of newly diagnosed hypothyroidism. #4 normocytic anemia related to chemotherapy -resolving. hgb has improved to 11 #5 Left chest wall pain due to costochondritis - mx with pain medications. CTA x 2 neg for PE Plan -labs stable. -patient appropriate to proceed with her next cycle of treatment with maintenance Durvalumab. -counseled regarding importance of daily exercise/activity and continued follow-up with cancer rehabilitation  -DM2 better controlled -continue on levothyroxine for hypothyroidism likely related to her immunotherapy . Rpt Thyroid profile with next labs. -We shall start her on levothyroxine 50 g daily . -Given endocrinology referral to optimize treatment of her hypothyroidism and diabetes - hasnt been seen yet. --recommended maintenance of good po hydration and food intake -shall continue Durvalumab maintenance.   #6 Sinus tachycardia - on Cardizem CR , no PE on CTA x 2 Plan -Still having sinus tachycardia . Partly from deconditioning . -continue Cardizem CR 191m po daily -maintain good po hydration. -Follow-up with primary care physician for further evaluation regarding this    #3 neoplasm related pain is currently controlled without significant medications. Clinically likely has sleep apnea . Would need to be careful with sedative medications . Plan -recommend sleep study with PCP  #3 hypertension   #4 diabetes -uncontrolled #5 dyslipidemia #6 asthma Plan -  insulin SS for uncontrolled hyperglycemia in the setting of steroid use with chemotherapy. 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 The patient knows to call the clinic with any problems, questions or concerns.  I spent 20 minutes counseling the patient face to face. The total time spent in the appointment was 25 minutes and more than 50% was on counseling and direct patient cares.    GSullivan LoneMD MHendersonAAHIVMS SCincinnati Va Medical CenterCAssociated Surgical Center Of Dear[facility] LLCHematology/Oncology Physician [facility] (Office):       [phone](Work cell):  [phone](Fax):           [phone]

## 2016-09-22 ENCOUNTER — Ambulatory Visit (HOSPITAL_BASED_OUTPATIENT_CLINIC_OR_DEPARTMENT_OTHER): Payer: Medicaid Other

## 2016-09-22 DIAGNOSIS — C770 Secondary and unspecified malignant neoplasm of lymph nodes of head, face and neck: Secondary | ICD-10-CM | POA: Diagnosis not present

## 2016-09-22 DIAGNOSIS — C3491 Malignant neoplasm of unspecified part of right bronchus or lung: Secondary | ICD-10-CM

## 2016-09-22 DIAGNOSIS — Z452 Encounter for adjustment and management of vascular access device: Secondary | ICD-10-CM | POA: Diagnosis not present

## 2016-09-22 DIAGNOSIS — C3490 Malignant neoplasm of unspecified part of unspecified bronchus or lung: Secondary | ICD-10-CM

## 2016-09-22 DIAGNOSIS — C3492 Malignant neoplasm of unspecified part of left bronchus or lung: Secondary | ICD-10-CM

## 2016-09-22 MED ORDER — HEPARIN SOD (PORK) LOCK FLUSH 100 UNIT/ML IV SOLN
250.0000 [IU] | Freq: Once | INTRAVENOUS | Status: AC
  Administered 2016-09-22: 250 [IU] via INTRAVENOUS
  Filled 2016-09-22: qty 5

## 2016-09-22 MED ORDER — SODIUM CHLORIDE 0.9 % IJ SOLN
3.0000 mL | Freq: Once | INTRAMUSCULAR | Status: AC
  Administered 2016-09-22: 3 mL via INTRAVENOUS
  Filled 2016-09-22: qty 10

## 2016-09-22 NOTE — Patient Instructions (Signed)

## 2016-09-24 ENCOUNTER — Ambulatory Visit (HOSPITAL_BASED_OUTPATIENT_CLINIC_OR_DEPARTMENT_OTHER): Payer: Medicaid Other

## 2016-09-24 DIAGNOSIS — C770 Secondary and unspecified malignant neoplasm of lymph nodes of head, face and neck: Secondary | ICD-10-CM | POA: Diagnosis not present

## 2016-09-24 DIAGNOSIS — C3491 Malignant neoplasm of unspecified part of right bronchus or lung: Secondary | ICD-10-CM | POA: Diagnosis not present

## 2016-09-24 DIAGNOSIS — Z452 Encounter for adjustment and management of vascular access device: Secondary | ICD-10-CM | POA: Diagnosis not present

## 2016-09-24 MED ORDER — HEPARIN SOD (PORK) LOCK FLUSH 100 UNIT/ML IV SOLN
250.0000 [IU] | Freq: Once | INTRAVENOUS | Status: AC
  Administered 2016-09-24: 250 [IU]
  Filled 2016-09-24: qty 5

## 2016-09-24 MED ORDER — SODIUM CHLORIDE 0.9% FLUSH
10.0000 mL | Freq: Once | INTRAVENOUS | Status: AC
  Administered 2016-09-24: 10 mL
  Filled 2016-09-24: qty 10

## 2016-09-24 NOTE — Patient Instructions (Signed)
PICC Home Guide °A peripherally inserted central catheter (PICC) is a long, thin, flexible tube that is inserted into a vein in the upper arm. It is a form of intravenous (IV) access. It is considered to be a "central" line because the tip of the PICC ends in a large vein in your chest. This large vein is called the superior vena cava (SVC). The PICC tip ends in the SVC because there is a lot of blood flow in the SVC. This allows medicines and IV fluids to be quickly distributed throughout the body. The PICC is inserted using a sterile technique by a specially trained nurse or physician. After the PICC is inserted, a chest X-ray exam is done to be sure it is in the correct place. °A PICC may be placed for different reasons, such as: °· To give medicines and liquid nutrition that can only be given through a central line. Examples are: °? Certain antibiotic treatments. °? Chemotherapy. °? Total parenteral nutrition (TPN). °· To take frequent blood samples. °· To give IV fluids and blood products. °· If there is difficulty placing a peripheral intravenous (PIV) catheter. ° °If taken care of properly, a PICC can remain in place for several months. A PICC can also allow a person to go home from the hospital early. Medicine and PICC care can be managed at home by a family member or home health care team. °What problems can happen when I have a PICC? °Problems with a PICC can occasionally occur. These may include the following: °· A blood clot (thrombus) forming in or at the tip of the PICC. This can cause the PICC to become clogged. A clot-dissolving medicine called tissue plasminogen activator (tPA) can be given through the PICC to help break up the clot. °· Inflammation of the vein (phlebitis) in which the PICC is placed. Signs of inflammation may include redness, pain at the insertion site, red streaks, or being able to feel a "cord" in the vein where the PICC is located. °· Infection in the PICC or at the insertion  site. Signs of infection may include fever, chills, redness, swelling, or pus drainage from the PICC insertion site. °· PICC movement (malposition). The PICC tip may move from its original position due to excessive physical activity, forceful coughing, sneezing, or vomiting. °· A break or cut in the PICC. It is important to not use scissors near the PICC. °· Nerve or tendon irritation or injury during PICC insertion. ° °What should I keep in mind about activities when I have a PICC? °· You may bend your arm and move it freely. If your PICC is near or at the bend of your elbow, avoid activity with repeated motion at the elbow. °· Rest at home for the remainder of the day following PICC line insertion. °· Avoid lifting heavy objects as instructed by your health care provider. °· Avoid using a crutch with the arm on the same side as your PICC. You may need to use a walker. °What should I know about my PICC dressing? °· Keep your PICC bandage (dressing) clean and dry to prevent infection. °? Ask your health care provider when you may shower. Ask your health care provider to teach you how to wrap the PICC when you do take a shower. °· Change the PICC dressing as instructed by your health care provider. °· Change your PICC dressing if it becomes loose or wet. °What should I know about PICC care? °· Check the PICC insertion   site daily for leakage, redness, swelling, or pain. °· Do not take a bath, swim, or use hot tubs when you have a PICC. Cover PICC line with clear plastic wrap and tape to keep it dry while showering. °· Flush the PICC as directed by your health care provider. Let your health care provider know right away if the PICC is difficult to flush or does not flush. Do not use force to flush the PICC. °· Do not use a syringe that is less than 10 mL to flush the PICC. °· Never pull or tug on the PICC. °· Avoid blood pressure checks on the arm with the PICC. °· Keep your PICC identification card with you at all  times. °· Do not take the PICC out yourself. Only a trained clinical professional should remove the PICC. °Get help right away if: °· Your PICC is accidentally pulled all the way out. If this happens, cover the insertion site with a bandage or gauze dressing. Do not throw the PICC away. Your health care provider will need to inspect it. °· Your PICC was tugged or pulled and has partially come out. Do not  push the PICC back in. °· There is any type of drainage, redness, or swelling where the PICC enters the skin. °· You cannot flush the PICC, it is difficult to flush, or the PICC leaks around the insertion site when it is flushed. °· You hear a "flushing" sound when the PICC is flushed. °· You have pain, discomfort, or numbness in your arm, shoulder, or jaw on the same side as the PICC. °· You feel your heart "racing" or skipping beats. °· You notice a hole or tear in the PICC. °· You develop chills or a fever. °This information is not intended to replace advice given to you by your health care provider. Make sure you discuss any questions you have with your health care provider. °Document Released: 09/26/2002 Document Revised: 10/10/2015 Document Reviewed: 01/12/2013 °Elsevier Interactive Patient Education © 2017 [employer] ° °

## 2016-09-28 ENCOUNTER — Telehealth: Payer: Self-pay

## 2016-09-28 NOTE — Telephone Encounter (Signed)
Left message for pt to come to front desk and retrieve disability paperwork before 3pm, if able, today.

## 2016-10-04 ENCOUNTER — Other Ambulatory Visit: Payer: Self-pay | Admitting: *Deleted

## 2016-10-04 ENCOUNTER — Ambulatory Visit (HOSPITAL_BASED_OUTPATIENT_CLINIC_OR_DEPARTMENT_OTHER): Payer: Medicaid Other

## 2016-10-04 ENCOUNTER — Ambulatory Visit: Payer: Medicaid Other

## 2016-10-04 DIAGNOSIS — E039 Hypothyroidism, unspecified: Secondary | ICD-10-CM

## 2016-10-04 DIAGNOSIS — Z95828 Presence of other vascular implants and grafts: Secondary | ICD-10-CM

## 2016-10-04 DIAGNOSIS — Z452 Encounter for adjustment and management of vascular access device: Secondary | ICD-10-CM

## 2016-10-04 DIAGNOSIS — C3491 Malignant neoplasm of unspecified part of right bronchus or lung: Secondary | ICD-10-CM

## 2016-10-04 DIAGNOSIS — Z5112 Encounter for antineoplastic immunotherapy: Secondary | ICD-10-CM | POA: Diagnosis not present

## 2016-10-04 DIAGNOSIS — C3492 Malignant neoplasm of unspecified part of left bronchus or lung: Secondary | ICD-10-CM

## 2016-10-04 DIAGNOSIS — C770 Secondary and unspecified malignant neoplasm of lymph nodes of head, face and neck: Secondary | ICD-10-CM

## 2016-10-04 LAB — COMPREHENSIVE METABOLIC PANEL
ALT: 13 U/L (ref 0–55)
AST: 20 U/L (ref 5–34)
Albumin: 3.4 g/dL — ABNORMAL LOW (ref 3.5–5.0)
Alkaline Phosphatase: 77 U/L (ref 40–150)
Anion Gap: 9 mEq/L (ref 3–11)
BUN: 8.7 mg/dL (ref 7.0–26.0)
CALCIUM: 9.6 mg/dL (ref 8.4–10.4)
CHLORIDE: 102 meq/L (ref 98–109)
CO2: 31 meq/L — AB (ref 22–29)
Creatinine: 0.8 mg/dL (ref 0.6–1.1)
EGFR: >90 mL/min/{1.73_m2} (ref >90)
GLUCOSE: 131 mg/dL (ref 70–140)
POTASSIUM: 3.7 meq/L (ref 3.5–5.1)
SODIUM: 141 meq/L (ref 136–145)
Total Bilirubin: 0.38 mg/dL (ref 0.20–1.20)
Total Protein: 7.6 g/dL (ref 6.4–8.3)

## 2016-10-04 LAB — CBC WITH DIFFERENTIAL/PLATELET
BASO%: 0.5 % (ref 0.0–2.0)
BASOS ABS: 0 10*3/uL (ref 0.0–0.1)
EOS%: 5.6 % (ref 0.0–7.0)
Eosinophils Absolute: 0.3 10*3/uL (ref 0.0–0.5)
HCT: 31.2 % — ABNORMAL LOW (ref 34.8–46.6)
HGB: 10.5 g/dL — ABNORMAL LOW (ref 11.6–15.9)
LYMPH%: 24 % (ref 14.0–49.7)
MCH: 28.4 pg (ref 25.1–34.0)
MCHC: 33.6 g/dL (ref 31.5–36.0)
MCV: 84.6 fL (ref 79.5–101.0)
MONO#: 0.3 10*3/uL (ref 0.1–0.9)
MONO%: 5.4 % (ref 0.0–14.0)
NEUT#: 4 10*3/uL (ref 1.5–6.5)
NEUT%: 64.5 % (ref 38.4–76.8)
Platelets: 308 10*3/uL (ref 145–400)
RBC: 3.69 10*6/uL — AB (ref 3.70–5.45)
RDW: 16.7 % — ABNORMAL HIGH (ref 11.2–14.5)
WBC: 6.2 10*3/uL (ref 3.9–10.3)
lymph#: 1.5 10*3/uL (ref 0.9–3.3)

## 2016-10-04 LAB — TSH: TSH: 40.75 m[IU]/L — AB (ref 0.308–3.960)

## 2016-10-04 MED ORDER — SODIUM CHLORIDE 0.9% FLUSH
10.0000 mL | INTRAVENOUS | Status: DC | PRN
  Administered 2016-10-04: 10 mL
  Filled 2016-10-04: qty 10

## 2016-10-04 MED ORDER — DIPHENHYDRAMINE HCL 25 MG PO TABS
25.0000 mg | ORAL_TABLET | Freq: Once | ORAL | Status: AC
  Administered 2016-10-04: 25 mg via ORAL
  Filled 2016-10-04: qty 1

## 2016-10-04 MED ORDER — DURVALUMAB 500 MG/10ML IV SOLN
9.6000 mg/kg | Freq: Once | INTRAVENOUS | Status: AC
  Administered 2016-10-04: 1120 mg via INTRAVENOUS
  Filled 2016-10-04: qty 20

## 2016-10-04 MED ORDER — SODIUM CHLORIDE 0.9 % IJ SOLN
10.0000 mL | INTRAMUSCULAR | Status: DC | PRN
  Administered 2016-10-04: 10 mL via INTRAVENOUS
  Filled 2016-10-04: qty 10

## 2016-10-04 MED ORDER — ACETAMINOPHEN 325 MG PO TABS
650.0000 mg | ORAL_TABLET | Freq: Once | ORAL | Status: AC
  Administered 2016-10-04: 650 mg via ORAL

## 2016-10-04 MED ORDER — HEPARIN SOD (PORK) LOCK FLUSH 100 UNIT/ML IV SOLN
500.0000 [IU] | Freq: Once | INTRAVENOUS | Status: AC | PRN
  Administered 2016-10-04: 500 [IU]
  Filled 2016-10-04: qty 5

## 2016-10-04 MED ORDER — ACETAMINOPHEN 325 MG PO TABS
ORAL_TABLET | ORAL | Status: AC
  Filled 2016-10-04: qty 2

## 2016-10-04 MED ORDER — SODIUM CHLORIDE 0.9 % IV SOLN
Freq: Once | INTRAVENOUS | Status: AC
  Administered 2016-10-04: 14:00:00 via INTRAVENOUS

## 2016-10-04 MED ORDER — DIPHENHYDRAMINE HCL 25 MG PO CAPS
ORAL_CAPSULE | ORAL | Status: AC
  Filled 2016-10-04: qty 1

## 2016-10-04 NOTE — Patient Instructions (Signed)
PICC Home Guide °A peripherally inserted central catheter (PICC) is a long, thin, flexible tube that is inserted into a vein in the upper arm. It is a form of intravenous (IV) access. It is considered to be a "central" line because the tip of the PICC ends in a large vein in your chest. This large vein is called the superior vena cava (SVC). The PICC tip ends in the SVC because there is a lot of blood flow in the SVC. This allows medicines and IV fluids to be quickly distributed throughout the body. The PICC is inserted using a sterile technique by a specially trained nurse or physician. After the PICC is inserted, a chest X-ray exam is done to be sure it is in the correct place. °A PICC may be placed for different reasons, such as: °· To give medicines and liquid nutrition that can only be given through a central line. Examples are: °? Certain antibiotic treatments. °? Chemotherapy. °? Total parenteral nutrition (TPN). °· To take frequent blood samples. °· To give IV fluids and blood products. °· If there is difficulty placing a peripheral intravenous (PIV) catheter. ° °If taken care of properly, a PICC can remain in place for several months. A PICC can also allow a person to go home from the hospital early. Medicine and PICC care can be managed at home by a family member or home health care team. °What problems can happen when I have a PICC? °Problems with a PICC can occasionally occur. These may include the following: °· A blood clot (thrombus) forming in or at the tip of the PICC. This can cause the PICC to become clogged. A clot-dissolving medicine called tissue plasminogen activator (tPA) can be given through the PICC to help break up the clot. °· Inflammation of the vein (phlebitis) in which the PICC is placed. Signs of inflammation may include redness, pain at the insertion site, red streaks, or being able to feel a "cord" in the vein where the PICC is located. °· Infection in the PICC or at the insertion  site. Signs of infection may include fever, chills, redness, swelling, or pus drainage from the PICC insertion site. °· PICC movement (malposition). The PICC tip may move from its original position due to excessive physical activity, forceful coughing, sneezing, or vomiting. °· A break or cut in the PICC. It is important to not use scissors near the PICC. °· Nerve or tendon irritation or injury during PICC insertion. ° °What should I keep in mind about activities when I have a PICC? °· You may bend your arm and move it freely. If your PICC is near or at the bend of your elbow, avoid activity with repeated motion at the elbow. °· Rest at home for the remainder of the day following PICC line insertion. °· Avoid lifting heavy objects as instructed by your health care provider. °· Avoid using a crutch with the arm on the same side as your PICC. You may need to use a walker. °What should I know about my PICC dressing? °· Keep your PICC bandage (dressing) clean and dry to prevent infection. °? Ask your health care provider when you may shower. Ask your health care provider to teach you how to wrap the PICC when you do take a shower. °· Change the PICC dressing as instructed by your health care provider. °· Change your PICC dressing if it becomes loose or wet. °What should I know about PICC care? °· Check the PICC insertion   site daily for leakage, redness, swelling, or pain. °· Do not take a bath, swim, or use hot tubs when you have a PICC. Cover PICC line with clear plastic wrap and tape to keep it dry while showering. °· Flush the PICC as directed by your health care provider. Let your health care provider know right away if the PICC is difficult to flush or does not flush. Do not use force to flush the PICC. °· Do not use a syringe that is less than 10 mL to flush the PICC. °· Never pull or tug on the PICC. °· Avoid blood pressure checks on the arm with the PICC. °· Keep your PICC identification card with you at all  times. °· Do not take the PICC out yourself. Only a trained clinical professional should remove the PICC. °Get help right away if: °· Your PICC is accidentally pulled all the way out. If this happens, cover the insertion site with a bandage or gauze dressing. Do not throw the PICC away. Your health care provider will need to inspect it. °· Your PICC was tugged or pulled and has partially come out. Do not  push the PICC back in. °· There is any type of drainage, redness, or swelling where the PICC enters the skin. °· You cannot flush the PICC, it is difficult to flush, or the PICC leaks around the insertion site when it is flushed. °· You hear a "flushing" sound when the PICC is flushed. °· You have pain, discomfort, or numbness in your arm, shoulder, or jaw on the same side as the PICC. °· You feel your heart "racing" or skipping beats. °· You notice a hole or tear in the PICC. °· You develop chills or a fever. °This information is not intended to replace advice given to you by your health care provider. Make sure you discuss any questions you have with your health care provider. °Document Released: 09/26/2002 Document Revised: 10/10/2015 Document Reviewed: 01/12/2013 °Elsevier Interactive Patient Education © 2017 [employer] ° °

## 2016-10-04 NOTE — Patient Instructions (Signed)
[place] Discharge Instructions for Patients Receiving Chemotherapy  Today you received the following chemotherapy agents Imfinzi  To help prevent nausea and vomiting after your treatment, we encourage you to take your nausea medication    If you develop nausea and vomiting that is not controlled by your nausea medication, call the clinic.   BELOW ARE SYMPTOMS THAT SHOULD BE REPORTED IMMEDIATELY:  *FEVER GREATER THAN 100.5 F  *CHILLS WITH OR WITHOUT FEVER  NAUSEA AND VOMITING THAT IS NOT CONTROLLED WITH YOUR NAUSEA MEDICATION  *UNUSUAL SHORTNESS OF BREATH  *UNUSUAL BRUISING OR BLEEDING  TENDERNESS IN MOUTH AND THROAT WITH OR WITHOUT PRESENCE OF ULCERS  *URINARY PROBLEMS  *BOWEL PROBLEMS  UNUSUAL RASH Items with * indicate a potential emergency and should be followed up as soon as possible.  Feel free to call the clinic you have any questions or concerns. The clinic phone number is [phone].  Please show the [place] at check-in to the Emergency Department and triage nurse.

## 2016-10-05 LAB — T3 UPTAKE
Free Thyroxine Index: 0.3 — ABNORMAL LOW (ref 1.2–4.9)
T3 Uptake Ratio: 13 % — ABNORMAL LOW (ref 24–39)

## 2016-10-05 LAB — T4, FREE: FREE T4: 0.28 ng/dL — AB (ref 0.82–1.77)

## 2016-10-05 LAB — T4: T4 TOTAL: 2.3 ug/dL — AB (ref 4.5–12.0)

## 2016-10-11 ENCOUNTER — Ambulatory Visit (HOSPITAL_BASED_OUTPATIENT_CLINIC_OR_DEPARTMENT_OTHER): Payer: Medicaid Other

## 2016-10-11 ENCOUNTER — Telehealth: Payer: Self-pay | Admitting: *Deleted

## 2016-10-11 DIAGNOSIS — Z452 Encounter for adjustment and management of vascular access device: Secondary | ICD-10-CM | POA: Diagnosis present

## 2016-10-11 DIAGNOSIS — C3491 Malignant neoplasm of unspecified part of right bronchus or lung: Secondary | ICD-10-CM | POA: Diagnosis not present

## 2016-10-11 DIAGNOSIS — Z95828 Presence of other vascular implants and grafts: Secondary | ICD-10-CM

## 2016-10-11 MED ORDER — SODIUM CHLORIDE 0.9% FLUSH
10.0000 mL | Freq: Once | INTRAVENOUS | Status: AC
  Administered 2016-10-11: 10 mL
  Filled 2016-10-11: qty 10

## 2016-10-11 MED ORDER — HEPARIN SOD (PORK) LOCK FLUSH 100 UNIT/ML IV SOLN
500.0000 [IU] | Freq: Once | INTRAVENOUS | Status: AC
  Administered 2016-10-11: 250 [IU]
  Filled 2016-10-11: qty 5

## 2016-10-11 NOTE — Telephone Encounter (Signed)
"  My PICC has not been flushed since October 04, 2016.  You all did not schedule it and I've been too busy.  Can you schedule me with [name]."  Scheduler notified.  Patient agreed as asked to register at 3:00 pm for 3:30 flush.  Instructed to stop by scheduling for copy of schedule.  No further questions.

## 2016-10-13 ENCOUNTER — Ambulatory Visit: Payer: Medicaid Other

## 2016-10-13 MED ORDER — SODIUM CHLORIDE 0.9% FLUSH
10.0000 mL | Freq: Once | INTRAVENOUS | Status: DC
  Filled 2016-10-13: qty 10

## 2016-10-13 MED ORDER — HEPARIN SOD (PORK) LOCK FLUSH 100 UNIT/ML IV SOLN
250.0000 [IU] | Freq: Once | INTRAVENOUS | Status: DC
  Filled 2016-10-13: qty 5

## 2016-10-15 ENCOUNTER — Ambulatory Visit (HOSPITAL_BASED_OUTPATIENT_CLINIC_OR_DEPARTMENT_OTHER): Payer: Medicaid Other

## 2016-10-15 DIAGNOSIS — C3491 Malignant neoplasm of unspecified part of right bronchus or lung: Secondary | ICD-10-CM

## 2016-10-15 DIAGNOSIS — Z452 Encounter for adjustment and management of vascular access device: Secondary | ICD-10-CM

## 2016-10-15 MED ORDER — SODIUM CHLORIDE 0.9% FLUSH
10.0000 mL | Freq: Once | INTRAVENOUS | Status: AC
  Administered 2016-10-15: 10 mL
  Filled 2016-10-15: qty 10

## 2016-10-15 MED ORDER — HEPARIN SOD (PORK) LOCK FLUSH 100 UNIT/ML IV SOLN
250.0000 [IU] | Freq: Once | INTRAVENOUS | Status: AC
  Administered 2016-10-15: 250 [IU]
  Filled 2016-10-15: qty 5

## 2016-10-18 ENCOUNTER — Ambulatory Visit (HOSPITAL_BASED_OUTPATIENT_CLINIC_OR_DEPARTMENT_OTHER): Payer: Medicaid Other

## 2016-10-18 DIAGNOSIS — Z95828 Presence of other vascular implants and grafts: Secondary | ICD-10-CM

## 2016-10-18 DIAGNOSIS — Z452 Encounter for adjustment and management of vascular access device: Secondary | ICD-10-CM

## 2016-10-18 DIAGNOSIS — C3491 Malignant neoplasm of unspecified part of right bronchus or lung: Secondary | ICD-10-CM

## 2016-10-18 MED ORDER — SODIUM CHLORIDE 0.9 % IJ SOLN
10.0000 mL | INTRAMUSCULAR | Status: DC | PRN
  Administered 2016-10-18: 10 mL via INTRAVENOUS
  Filled 2016-10-18: qty 10

## 2016-10-18 MED ORDER — HEPARIN SOD (PORK) LOCK FLUSH 100 UNIT/ML IV SOLN
500.0000 [IU] | Freq: Once | INTRAVENOUS | Status: AC | PRN
Start: 2016-10-18 — End: 2016-10-18
  Administered 2016-10-18: 250 [IU] via INTRAVENOUS
  Filled 2016-10-18: qty 5

## 2016-10-20 ENCOUNTER — Other Ambulatory Visit: Payer: Medicaid Other

## 2016-10-20 ENCOUNTER — Ambulatory Visit: Payer: Medicaid Other

## 2016-10-20 ENCOUNTER — Ambulatory Visit: Payer: Medicaid Other | Admitting: Hematology

## 2016-10-25 ENCOUNTER — Ambulatory Visit (HOSPITAL_BASED_OUTPATIENT_CLINIC_OR_DEPARTMENT_OTHER): Payer: Medicaid Other

## 2016-10-25 ENCOUNTER — Telehealth: Payer: Self-pay

## 2016-10-25 DIAGNOSIS — C3491 Malignant neoplasm of unspecified part of right bronchus or lung: Secondary | ICD-10-CM | POA: Diagnosis not present

## 2016-10-25 DIAGNOSIS — Z95828 Presence of other vascular implants and grafts: Secondary | ICD-10-CM

## 2016-10-25 DIAGNOSIS — Z452 Encounter for adjustment and management of vascular access device: Secondary | ICD-10-CM

## 2016-10-25 MED ORDER — HEPARIN SOD (PORK) LOCK FLUSH 100 UNIT/ML IV SOLN
250.0000 [IU] | Freq: Once | INTRAVENOUS | Status: AC
  Administered 2016-10-25: 250 [IU]
  Filled 2016-10-25: qty 5

## 2016-10-25 MED ORDER — HEPARIN SOD (PORK) LOCK FLUSH 100 UNIT/ML IV SOLN
500.0000 [IU] | Freq: Once | INTRAVENOUS | Status: DC | PRN
  Filled 2016-10-25: qty 5

## 2016-10-25 MED ORDER — SODIUM CHLORIDE 0.9% FLUSH
10.0000 mL | Freq: Once | INTRAVENOUS | Status: AC
  Administered 2016-10-25: 10 mL
  Filled 2016-10-25: qty 10

## 2016-10-25 NOTE — Progress Notes (Signed)
Patient reported to flush room to have picc line flushed. Picc dressing incomplete, non occlusive and dirty. Patient refused dressing change. Advised patient this area needs to be addressed and attempted to educate patient on importance of having fresh dressing completed. Patient continued to refuse. Dressing was reinforced and cap was changed. Patient discharged from flush room. Dr. Irene Limbo nurse was notified.

## 2016-10-25 NOTE — Telephone Encounter (Signed)
LPN called to let me know that pt noncompliant with PICC care and dressing. Pt dressing not clean and adequately adhered to skin. Marilyn Cruz, LPN educated pt on infection prevention and appropriate PICC care. Pt refused to have dressing changed until Wednesday with [name], LPN. Pt allowed Marilyn [name] to reinforce dressing. Dr. Irene Limbo notified and request for reinforcement with patient.

## 2016-10-26 ENCOUNTER — Encounter: Payer: Self-pay | Admitting: Internal Medicine

## 2016-10-26 ENCOUNTER — Ambulatory Visit (INDEPENDENT_AMBULATORY_CARE_PROVIDER_SITE_OTHER): Payer: Medicaid Other | Admitting: Internal Medicine

## 2016-10-26 VITALS — BP 96/70 | HR 122 | Ht 69.5 in | Wt 269.0 lb

## 2016-10-26 DIAGNOSIS — R000 Tachycardia, unspecified: Secondary | ICD-10-CM

## 2016-10-26 DIAGNOSIS — R079 Chest pain, unspecified: Secondary | ICD-10-CM | POA: Diagnosis not present

## 2016-10-26 DIAGNOSIS — R0602 Shortness of breath: Secondary | ICD-10-CM | POA: Diagnosis not present

## 2016-10-26 NOTE — Patient Instructions (Addendum)
Medication Instructions:  Your physician recommends that you continue on your current medications as directed. Please refer to the Current Medication list given to you today.  Labwork: NONE  Testing/Procedures: Your physician has requested that you have an echocardiogram. Echocardiography is a painless test that uses sound waves to create images of your heart. It provides your doctor with information about the size and shape of your heart and how well your heart's chambers and valves are working. This procedure takes approximately one hour. There are no restrictions for this procedure.  LIMITED ECHO [facility] AT [address]  Your physician has requested that you have a lexiscan myoview. For further information please visit [email or web address]. Please follow instruction sheet, as given. 2 DAY   Follow-Up: Your physician recommends that you schedule a follow-up appointment in: AFTER TESTING COMPLETED   If you need a refill on your cardiac medications before your next appointment, please call your pharmacy.

## 2016-10-26 NOTE — Progress Notes (Signed)
OFFICE CONSULT NOTE  Chief Complaint:  "Fast heart rate"  Primary Care Physician: [name], FNP  HPI:  Marilyn [name] is a 51 y.o. female who is being seen today for the evaluation of tachycardia at the request of Starkes, Takia S, FNP. Mrs. Degroff is a pleasant 51 yo female with a history of diagnosis of lung cancer in September 2017. At that time she underwent chemotherapy and radiation. She has since been deemed cancer free. She was noted to be tachycardic at the time and an echocardiogram was performed. This demonstrated moderate LVH, indeterminate diastolic function EF 35-46%. No significant valvular heart disease and no pericardial effusion was noted. Since then she's been persistently tachycardic with heart rates in the 100-120 range. She denies any worsening shortness of breath or chest pain. She is moderately obese. Blood pressure today is low 96/70. She was previously on metoprolol but that was discontinued as it worsened her asthma. She is now on diltiazem 180 mg for rate control. She has a number of prevascular risk factors including type 2 diabetes, obesity, hypercholesterolemia, hypertension and family history of heart failure in her mother. She is not a smoker. She also has recently diagnosed hypothyroidism with laboratory work that indicated a markedly elevated TSH of 43.4 and a low T4 of 3. Hemoglobin A1c was 9.1.  PMHx:  Past Medical History:  Diagnosis Date  . Asthma   . Diabetes mellitus   . Hypercholesteremia   . Hypertension   . Lung cancer ([place])   . Neuropathy   . Obese     Past Surgical History:  Procedure Laterality Date  . CESAREAN SECTION    . IR GENERIC HISTORICAL  01/08/2016   IR US GUIDE VASC ACCESS RIGHT 01/08/2016 [name], DO [facility]  . IR GENERIC HISTORICAL  01/08/2016   IR FLUORO GUIDE CV LINE RIGHT 01/08/2016 [name], DO [facility]  . TUBAL LIGATION    . US guided core needle biopsy     of right lower  neck/supraclavicular lymph nodes    FAMHx:  Family History  Problem Relation Age of Onset  . Heart failure Mother   . Hypertension Mother   . Cancer Neg Hx   . Rheumatologic disease Neg Hx     SOCHx:   reports that she has never smoked. She has never used smokeless tobacco. She reports that she does not drink alcohol or use drugs.  ALLERGIES:  No Known Allergies  ROS: Pertinent items noted in HPI and remainder of comprehensive ROS otherwise negative.  HOME MEDS: Current Outpatient Prescriptions on File Prior to Visit  Medication Sig Dispense Refill  . aspirin EC 81 MG tablet Take 1 tablet (81 mg total) by mouth daily. 60 tablet 2  . diltiazem (CARDIZEM CD) 180 MG 24 hr capsule Take 1 capsule (180 mg total) by mouth daily. 30 capsule 1  . DULoxetine (CYMBALTA) 60 MG capsule Take 1 capsule (60 mg total) by mouth daily. 30 capsule 2  . insulin aspart (NOVOLOG) 100 UNIT/ML FlexPen As per sliding scale instructions (Patient taking differently: Inject 0-20 Units into the skin 3 (three) times daily as needed for high blood sugar. As per sliding scale instructions) 15 mL 2  . Insulin Degludec (TRESIBA FLEXTOUCH) 200 UNIT/ML SOPN Inject 100 Units into the skin daily before breakfast.     . levothyroxine (SYNTHROID) 50 MCG tablet Take 1 tablet (50 mcg total) by mouth daily before breakfast. 30 tablet 1  . oxyCODONE-acetaminophen (PERCOCET/ROXICET) 5-325  MG tablet Take 1-2 tablets by mouth every 6 (six) hours as needed for moderate pain or severe pain. 40 tablet 0  . polyethylene glycol (MIRALAX) packet Take 17 g by mouth daily. 30 each 1  . senna-docusate (SENNA S) 8.6-50 MG tablet Take 2 tablets by mouth 2 (two) times daily. May reduce to 2 tab po HS as needed once regular BM estabished 60 tablet 1  . [DISCONTINUED] insulin aspart protamine- aspart (NOVOLOG MIX 70/30) (70-30) 100 UNIT/ML injection Inject 0.6 mLs (60 Units total) into the skin 2 (two) times daily with a meal. (Patient not  taking: Reported on 10/07/2014) 10 mL 12  . [DISCONTINUED] lisinopril (PRINIVIL,ZESTRIL) 20 MG tablet Take 0.5 tablets (10 mg total) by mouth daily. (Patient not taking: Reported on 10/07/2014) 30 tablet 0  . [DISCONTINUED] prochlorperazine (COMPAZINE) 10 MG tablet Take 1 tablet (10 mg total) by mouth every 6 (six) hours as needed (Nausea or vomiting). 30 tablet 1   Current Facility-Administered Medications on File Prior to Visit  Medication Dose Route Frequency Provider Last Rate Last Dose  . sodium chloride 0.9 % injection 10 mL  10 mL Intravenous PRN [name], MD   10 mL at 05/24/16 1252    LABS/IMAGING: No results found for this or any previous visit (from the past 48 hour(s)). No results found.  LIPID PANEL: No results found for: CHOL, TRIG, HDL, CHOLHDL, VLDL, LDLCALC, LDLDIRECT  WEIGHTS: Wt Readings from Last 3 Encounters:  10/26/16 269 lb (122 kg)  09/20/16 269 lb 4.8 oz (122.2 kg)  08/23/16 255 lb 6.4 oz (115.8 kg)    VITALS: BP 96/70   Pulse (!) 122   Ht 5' 9.5" (1.765 m)   Wt 269 lb (122 kg)   BMI 39.15 kg/m   EXAM: General appearance: alert, no distress and moderately obese Neck: no carotid bruit, no JVD and thyroid not enlarged, symmetric, no tenderness/mass/nodules Lungs: diminished breath sounds bilaterally Heart: Regular tachycardia, no murmur Abdomen: soft, non-tender; bowel sounds normal; no masses,  no organomegaly Extremities: extremities normal, atraumatic, no cyanosis or edema Pulses: 2+ and symmetric Skin: Skin color, texture, turgor normal. No rashes or lesions Neurologic: Grossly normal Psych: Pleasant  EKG: Sinus tachycardia 122, possible left atrial enlargement, nonspecific T wave changes - personally reviewed  ASSESSMENT: 1. Inappropriate sinus tachycardia 2. Recent lung cancer status post radiation chemotherapy 3. Morbid obesity 4. Type 2 diabetes 5. Hypertension 6. Dyslipidemia  PLAN: 1.   Mrs. Decker has multiple cardiac  risk factors including type 2 diabetes, morbid obesity, hypertension and dyslipidemia. She has an inappropriate sinus tachycardia with heart rate varies between 101-120. She was intolerant of beta blocker as it worsened her asthma. She has some rate control on diltiazem although is hypotensive. Possible causes of her tachycardia could include ischemia. I recommend a Lexiscan Myoview to evaluate this. In addition worsening cardiomyopathy, pericardial effusion or other structural cardiac abnormalities could contribute although she was noted to be tachycardic when she had her initial echo in September. Will repeat a limited echo to see this is been any interval change. Finally she's noted to be hypothyroidism, although suggested by an elevated TSH and low T4. She is on treatment for this. I would not suspect this to cause her tachycardia however it would be worthwhile for her to follow-up with an endocrinologist.  Thanks for the kind referral. We'll see her back to discuss the findings of her testing in the near future.  Pixie Casino, MD, [facility]  [facility]  [place]  Attending Cardiologist  Direct Dial: [phone]  Fax: [phone]  Website:  [name] 10/26/2016, 1:14 PM

## 2016-10-27 ENCOUNTER — Ambulatory Visit (HOSPITAL_BASED_OUTPATIENT_CLINIC_OR_DEPARTMENT_OTHER): Payer: Medicaid Other

## 2016-10-27 DIAGNOSIS — Z95828 Presence of other vascular implants and grafts: Secondary | ICD-10-CM

## 2016-10-27 DIAGNOSIS — C3491 Malignant neoplasm of unspecified part of right bronchus or lung: Secondary | ICD-10-CM

## 2016-10-27 DIAGNOSIS — Z452 Encounter for adjustment and management of vascular access device: Secondary | ICD-10-CM | POA: Diagnosis not present

## 2016-10-27 MED ORDER — HEPARIN SOD (PORK) LOCK FLUSH 100 UNIT/ML IV SOLN
500.0000 [IU] | Freq: Once | INTRAVENOUS | Status: AC | PRN
  Administered 2016-10-27: 250 [IU] via INTRAVENOUS
  Filled 2016-10-27: qty 5

## 2016-10-27 MED ORDER — SODIUM CHLORIDE 0.9 % IJ SOLN
10.0000 mL | INTRAMUSCULAR | Status: DC | PRN
Start: 2016-10-27 — End: 2016-10-27
  Administered 2016-10-27: 10 mL via INTRAVENOUS
  Filled 2016-10-27: qty 10

## 2016-10-29 ENCOUNTER — Other Ambulatory Visit: Payer: Self-pay

## 2016-10-29 ENCOUNTER — Emergency Department (HOSPITAL_COMMUNITY)
Admission: EM | Admit: 2016-10-29 | Discharge: 2016-10-29 | Disposition: A | Discharge status: Home / Self Care | Payer: Medicaid Other | Attending: Emergency Medicine | Admitting: Emergency Medicine

## 2016-10-29 ENCOUNTER — Emergency Department (HOSPITAL_COMMUNITY): Payer: Medicaid Other

## 2016-10-29 ENCOUNTER — Ambulatory Visit: Payer: Medicaid Other | Admitting: Radiation Oncology

## 2016-10-29 ENCOUNTER — Encounter (HOSPITAL_COMMUNITY): Payer: Self-pay | Admitting: *Deleted

## 2016-10-29 ENCOUNTER — Telehealth: Payer: Self-pay

## 2016-10-29 DIAGNOSIS — E039 Hypothyroidism, unspecified: Secondary | ICD-10-CM | POA: Insufficient documentation

## 2016-10-29 DIAGNOSIS — Z7982 Long term (current) use of aspirin: Secondary | ICD-10-CM | POA: Diagnosis not present

## 2016-10-29 DIAGNOSIS — E119 Type 2 diabetes mellitus without complications: Secondary | ICD-10-CM | POA: Diagnosis not present

## 2016-10-29 DIAGNOSIS — Z794 Long term (current) use of insulin: Secondary | ICD-10-CM | POA: Insufficient documentation

## 2016-10-29 DIAGNOSIS — J45909 Unspecified asthma, uncomplicated: Secondary | ICD-10-CM | POA: Insufficient documentation

## 2016-10-29 DIAGNOSIS — R42 Dizziness and giddiness: Secondary | ICD-10-CM | POA: Diagnosis not present

## 2016-10-29 DIAGNOSIS — R531 Weakness: Secondary | ICD-10-CM | POA: Diagnosis not present

## 2016-10-29 DIAGNOSIS — R1901 Right upper quadrant abdominal swelling, mass and lump: Secondary | ICD-10-CM | POA: Diagnosis not present

## 2016-10-29 DIAGNOSIS — Z85118 Personal history of other malignant neoplasm of bronchus and lung: Secondary | ICD-10-CM | POA: Insufficient documentation

## 2016-10-29 DIAGNOSIS — Z79899 Other long term (current) drug therapy: Secondary | ICD-10-CM | POA: Insufficient documentation

## 2016-10-29 DIAGNOSIS — I10 Essential (primary) hypertension: Secondary | ICD-10-CM | POA: Diagnosis not present

## 2016-10-29 LAB — BASIC METABOLIC PANEL
Anion gap: 8 (ref 5–15)
BUN: 9 mg/dL (ref 6–20)
CHLORIDE: 103 mmol/L (ref 101–111)
CO2: 29 mmol/L (ref 22–32)
CREATININE: 0.68 mg/dL (ref 0.44–1.00)
Calcium: 8.4 mg/dL — ABNORMAL LOW (ref 8.9–10.3)
GFR calc non Af Amer: >60 mL/min (ref >60)
Glucose, Bld: 154 mg/dL — ABNORMAL HIGH (ref 65–99)
POTASSIUM: 3.5 mmol/L (ref 3.5–5.1)
Sodium: 140 mmol/L (ref 135–145)

## 2016-10-29 LAB — D-DIMER, QUANTITATIVE: D-Dimer, Quant: 0.71 ug/mL-FEU — ABNORMAL HIGH (ref 0.00–0.50)

## 2016-10-29 LAB — CBC
HEMATOCRIT: 29.3 % — AB (ref 36.0–46.0)
Hemoglobin: 10 g/dL — ABNORMAL LOW (ref 12.0–15.0)
MCH: 29 pg (ref 26.0–34.0)
MCHC: 34.1 g/dL (ref 30.0–36.0)
MCV: 84.9 fL (ref 78.0–100.0)
PLATELETS: 282 10*3/uL (ref 150–400)
RBC: 3.45 MIL/uL — AB (ref 3.87–5.11)
RDW: 13.9 % (ref 11.5–15.5)
WBC: 5.5 10*3/uL (ref 4.0–10.5)

## 2016-10-29 LAB — I-STAT TROPONIN, ED: Troponin i, poc: 0 ng/mL (ref 0.00–0.08)

## 2016-10-29 MED ORDER — ONDANSETRON HCL 4 MG/2ML IJ SOLN
4.0000 mg | Freq: Once | INTRAMUSCULAR | Status: AC
  Administered 2016-10-29: 4 mg via INTRAVENOUS
  Filled 2016-10-29: qty 2

## 2016-10-29 MED ORDER — IOPAMIDOL (ISOVUE-370) INJECTION 76%
INTRAVENOUS | Status: AC
  Administered 2016-10-29: 100 mL via INTRAVENOUS
  Filled 2016-10-29: qty 100

## 2016-10-29 MED ORDER — SODIUM CHLORIDE 0.9 % IV BOLUS (SEPSIS)
500.0000 mL | Freq: Once | INTRAVENOUS | Status: AC
  Administered 2016-10-29: 500 mL via INTRAVENOUS

## 2016-10-29 MED ORDER — HYDROMORPHONE HCL 1 MG/ML IJ SOLN
1.0000 mg | Freq: Once | INTRAMUSCULAR | Status: AC
  Administered 2016-10-29: 1 mg via INTRAMUSCULAR
  Filled 2016-10-29: qty 1

## 2016-10-29 MED ORDER — HEPARIN SOD (PORK) LOCK FLUSH 100 UNIT/ML IV SOLN
500.0000 [IU] | Freq: Once | INTRAVENOUS | Status: AC
Start: 2016-10-29 — End: 2016-10-29
  Administered 2016-10-29: 250 [IU]
  Filled 2016-10-29: qty 5

## 2016-10-29 MED ORDER — HYDROMORPHONE HCL 1 MG/ML IJ SOLN: 1.0000 mg | Freq: Once | INTRAMUSCULAR | Status: DC

## 2016-10-29 NOTE — ED Notes (Signed)
Family at bedside. 

## 2016-10-29 NOTE — ED Notes (Signed)
Pt tolerated pain medication well.  A&Ox4.  Family will be driving her home.  Pt verbalizes understanding to follow up with oncologist

## 2016-10-29 NOTE — ED Notes (Signed)
Please see triage note

## 2016-10-29 NOTE — ED Triage Notes (Signed)
Pt reports generalized weakness and dizziness today-went to pick up her son's med and barely able to get out of the car.  She also reports noticing a round mass in her RLQ x 1 week - painful with palpation.  Pt also reports h/a and chest pain.  Have been coughing with chills lately.  Pt is A&Ox 4.  Pt has hx of lung cancer, last chemo was in December but is receiving abx via PICC line to prevent recurrence of lung cancer.

## 2016-10-29 NOTE — Telephone Encounter (Signed)
Received call from mother stating that patient was, "feeling really bad today." Attempted to acquire more details about precise symptoms, but she just said, "She feels like she did when she first got her cancer." Additionally, patient is dealing with having to care for son after recent paralysis. Recommended at this time that patient go to the the ED if she is having difficulty with function and energy. Mother voiced understanding. Spoke with Dr. Irene Limbo who agreed. Pt was supposed to have PICC dressing change today and f/u with Dr. Irene Limbo on Monday. Pt recently noncompliant with dressing change as needed. Potential that pt could be experiencing infection related symptoms. Dr. Irene Limbo will f/u with results from ED visit.

## 2016-10-29 NOTE — ED Provider Notes (Signed)
[facility] DEPT Provider Note   CSN: [id number] Arrival date & time: 10/29/16  1238     History   Chief Complaint Chief Complaint  Patient presents with  . Weakness  . Dizziness    HPI CHARLESTON Marilyn [name] is a 51 y.o. female.  HPI  Patient, with a past medical history of lung cancer, diabetes, presents to ED for chest pain, shortness of breath, nausea, generalized weakness and dizziness that began today. She has also noticed a mass in her right flank area for the past week. She completed her chemotherapy in December 2017. She has been on antibiotics biweekly through her PICC line. She also reports subjective fever. She has oxycodone and hydrocodone syrup at home to be taken as needed. She has not taken any pain medication or antipyretics today. She reports feeling fine yesterday and before that. She denies any previous history of PE, DVT, MI, abdominal pain, diarrhea, vomiting.   Past Medical History:  Diagnosis Date  . Asthma   . Diabetes mellitus   . Hypercholesteremia   . Hypertension   . Lung cancer ([place])   . Neuropathy   . Obese     Patient Active Problem List   Diagnosis Date Noted  . SOB (shortness of breath) 10/26/2016  . Hypothyroidism 08/22/2016  . PICC (peripherally inserted central catheter) flush 07/16/2016  . Community acquired pneumonia   . Sinus tachycardia   . Thrush of mouth and esophagus ([place]) 04/07/2016  . Constipation 04/07/2016  . Palliative care by specialist   . Port catheter in place 01/19/2016  . Dehydration 01/14/2016  . Tachycardia 01/14/2016  . Adenocarcinoma of left lung, stage 3 ([place]) 01/01/2016  . Non-small cell lung cancer ([place]) 12/31/2015  . Mediastinal mass   . Chest pain 12/26/2015  . Headache 12/26/2015  . Diabetes mellitus ([place]) 12/26/2015    Past Surgical History:  Procedure Laterality Date  . CESAREAN SECTION    . IR GENERIC HISTORICAL  01/08/2016   IR US GUIDE VASC ACCESS RIGHT 01/08/2016 [name], DO [facility]    . IR GENERIC HISTORICAL  01/08/2016   IR FLUORO GUIDE CV LINE RIGHT 01/08/2016 [name], DO [facility]  . TUBAL LIGATION    . US guided core needle biopsy     of right lower neck/supraclavicular lymph nodes    OB History    No data available       Home Medications    Prior to Admission medications   Medication Sig Start Date End Date Taking? Authorizing Provider  aspirin EC 81 MG tablet Take 1 tablet (81 mg total) by mouth daily. 07/26/16  Yes [name], MD  diltiazem (CARDIZEM CD) 180 MG 24 hr capsule Take 1 capsule (180 mg total) by mouth daily. 08/23/16  Yes [name], MD  DULoxetine (CYMBALTA) 60 MG capsule Take 1 capsule (60 mg total) by mouth daily. 09/20/16  Yes [name], MD  insulin aspart (NOVOLOG) 100 UNIT/ML FlexPen As per sliding scale instructions Patient taking differently: Inject 0-20 Units into the skin 3 (three) times daily as needed for high blood sugar. As per sliding scale instructions 01/16/16  Yes [name], MD  Insulin Degludec (TRESIBA FLEXTOUCH) 200 UNIT/ML SOPN Inject 100 Units into the skin daily before breakfast.    Yes [provider]  levothyroxine (SYNTHROID) 50 MCG tablet Take 1 tablet (50 mcg total) by mouth daily before breakfast. 08/22/16  Yes [name], MD  oxyCODONE-acetaminophen (PERCOCET/ROXICET) 5-325 MG tablet  Take 1-2 tablets by mouth every 6 (six) hours as needed for moderate pain or severe pain. 09/20/16  Yes [name], MD  polyethylene glycol [facility]) packet Take 17 g by mouth daily. 05/24/16  Yes [name], MD  senna-docusate (SENNA S) 8.6-50 MG tablet Take 2 tablets by mouth 2 (two) times daily. May reduce to 2 tab po HS as needed once regular BM estabished 05/24/16  Yes [name], MD    Family History Family History  Problem Relation Age of Onset  . Heart failure Mother   . Hypertension Mother   . Cancer Neg Hx   . Rheumatologic disease  Neg Hx     Social History Social History  Substance Use Topics  . Smoking status: Never Smoker  . Smokeless tobacco: Never Used  . Alcohol use No     Allergies   Patient has no known allergies.   Review of Systems Review of Systems  Constitutional: Positive for chills and fatigue. Negative for appetite change and fever.  HENT: Negative for ear pain, rhinorrhea, sneezing and sore throat.   Eyes: Negative for photophobia and visual disturbance.  Respiratory: Positive for cough and shortness of breath. Negative for chest tightness and wheezing.   Cardiovascular: Positive for chest pain. Negative for palpitations.  Gastrointestinal: Positive for nausea. Negative for abdominal pain, blood in stool, constipation, diarrhea and vomiting.  Genitourinary: Negative for dysuria, hematuria and urgency.  Musculoskeletal: Negative for myalgias.  Skin: Negative for rash.  Neurological: Positive for weakness. Negative for dizziness and light-headedness.     Physical Exam Updated Vital Signs BP 112/76   Pulse 99   Temp 98.7 F (37.1 C) (Oral)   Resp 18   SpO2 100%   Physical Exam  Constitutional: She appears well-developed and well-nourished. No distress.  Nontoxic appearing but appears lethargic.  HENT:  Head: Normocephalic and atraumatic.  Nose: Nose normal.  Eyes: Conjunctivae and EOM are normal. Left eye exhibits no discharge. No scleral icterus.  Neck: Normal range of motion. Neck supple.  Cardiovascular: Regular rhythm, normal heart sounds and intact distal pulses.  Tachycardia present.  Exam reveals no gallop and no friction rub.   No murmur heard. Pulmonary/Chest: Effort normal and breath sounds normal. No respiratory distress.  Abdominal: Soft. Bowel sounds are normal. She exhibits no distension. There is tenderness. There is no guarding.  Mass felt in right flank area  Musculoskeletal: Normal range of motion. She exhibits no edema.  Neurological: She is alert. She  exhibits normal muscle tone. Coordination normal.  Skin: Skin is warm and dry. No rash noted.  Psychiatric: She has a normal mood and affect.  Nursing note and vitals reviewed.    ED Treatments / Results  Labs (all labs ordered are listed, but only abnormal results are displayed) Labs Reviewed  BASIC METABOLIC PANEL - Abnormal; Notable for the following:       Result Value   Glucose, Bld 154 (*)    Calcium 8.4 (*)    All other components within normal limits  CBC - Abnormal; Notable for the following:    RBC 3.45 (*)    Hemoglobin 10.0 (*)    HCT 29.3 (*)    All other components within normal limits  D-DIMER, QUANTITATIVE (NOT AT [facility]) - Abnormal; Notable for the following:    D-Dimer, Quant 0.71 (*)    All other components within normal limits  I-STAT TROPONIN, ED    EKG  EKG Interpretation None  Radiology Dg Chest 2 View  Result Date: 10/29/2016 CLINICAL DATA:  Shortness of breath and chest pain EXAM: CHEST  2 VIEW COMPARISON:  09/13/2016 FINDINGS: Postradiation changes are again seen in the right perihilar and suprahilar region. Right-sided PICC line is again noted at the cavoatrial junction. No focal infiltrate or sizable effusion is seen. No acute bony abnormality is noted. IMPRESSION: Chronic changes without acute abnormality. Electronically Signed   By: Inez Catalina M.D.   On: 10/29/2016 13:42   Ct Angio Chest Pe W/cm &/or Wo Cm  Result Date: 10/29/2016 CLINICAL DATA:  [place].  History of lung cancer.  Right flank mass. EXAM: CT ANGIOGRAPHY CHEST CT ABDOMEN AND PELVIS WITH CONTRAST TECHNIQUE: Multidetector CT imaging of the chest was performed using the standard protocol during bolus administration of intravenous contrast. Multiplanar CT image reconstructions and MIPs were obtained to evaluate the vascular anatomy. Multidetector CT imaging of the abdomen and pelvis was performed using the standard protocol during bolus administration of intravenous contrast.  CONTRAST:  100 cc Isovue 370 COMPARISON:  Chest radiographs obtained today. Chest CTA dated 07/05/2016. Chest CT dated 09/13/2016. Abdomen and pelvis CT dated 12/27/2015. FINDINGS: CTA CHEST FINDINGS Cardiovascular: Normally opacified pulmonary arteries with no pulmonary arterial filling defects seen. Small pericardial effusion with a maximum thickness of 8 mm. Mediastinum/Nodes: Soft tissue density in the precarinal mediastinum on the right, previously measuring 3.8 cm in maximum diameter, currently measures 2.9 cm and maximum diameter. The previously demonstrated 10 mm short axis subcarinal node continues to have a 10 mm short axis diameter on image number 43 of series 4. No enlarged lymph nodes elsewhere in the mediastinum. Unremarkable included thyroid gland. Lungs/Pleura: Postradiation changes in the medial aspect of the right lung are stable. Clear left lung. Musculoskeletal: Thoracic spine degenerative changes. Review of the MIP images confirms the above findings. CT ABDOMEN and PELVIS FINDINGS Hepatobiliary: Multiple large, minimally calcified gallstones in the gallbladder. The largest measures 1.6 cm in maximum diameter. No gallbladder wall thickening or pericholecystic fluid. Unremarkable liver. Pancreas: Unremarkable. No pancreatic ductal dilatation or surrounding inflammatory changes. Spleen: Normal in size without focal abnormality. Adrenals/Urinary Tract: Adrenal glands are unremarkable. Kidneys are normal, without renal calculi, focal lesion, or hydronephrosis. Bladder is unremarkable. Stomach/Bowel: Unremarkable stomach, small bowel and colon. No evidence of appendicitis. Vascular/Lymphatic: No significant vascular findings are present. No enlarged abdominal or pelvic lymph nodes. Reproductive: Multiple uterine masses, measuring up to 4.1 cm in maximum diameter each. Normal appearing ovaries. Other: Lobulated, oval, heterogeneous enhancing mass in the right lateral subcutaneous fat at the level of  the mid abdomen. This mass measures 5.0 x 4.7 cm in maximum dimensions on image number 48 of series 5. This measures 5.0 cm in length on coronal image number 91. Very small umbilical hernia containing fat. Musculoskeletal: Minimal lumbar spine degenerative changes. No evidence of bony metastatic disease. Review of the MIP images confirms the above findings. IMPRESSION: 1. No pulmonary emboli. 2. No acute chest, abdomen or pelvic abnormality. 3. Interval 5.0 x 5.0 x 4.7 cm mass in the right lateral subcutaneous fat at the level of the mid abdomen. This has CT features compatible with a metastasis or primary neoplasm. This would be amenable to ultrasound-guided core needle biopsy. 4. Interval decrease in size of the previously demonstrated precarinal mediastinal mass. 5. Stable mildly enlarged subcarinal lymph node. 6. Stable right lung postradiation changes. 7. Cholelithiasis without evidence of cholecystitis. 8. Small pericardial effusion with a maximum thickness of 8 mm. Electronically Signed  By: Claudie Revering M.D.   On: 10/29/2016 15:52   Ct Abdomen Pelvis W Contrast  Result Date: 10/29/2016 CLINICAL DATA:  [place].  History of lung cancer.  Right flank mass. EXAM: CT ANGIOGRAPHY CHEST CT ABDOMEN AND PELVIS WITH CONTRAST TECHNIQUE: Multidetector CT imaging of the chest was performed using the standard protocol during bolus administration of intravenous contrast. Multiplanar CT image reconstructions and MIPs were obtained to evaluate the vascular anatomy. Multidetector CT imaging of the abdomen and pelvis was performed using the standard protocol during bolus administration of intravenous contrast. CONTRAST:  100 cc Isovue 370 COMPARISON:  Chest radiographs obtained today. Chest CTA dated 07/05/2016. Chest CT dated 09/13/2016. Abdomen and pelvis CT dated 12/27/2015. FINDINGS: CTA CHEST FINDINGS Cardiovascular: Normally opacified pulmonary arteries with no pulmonary arterial filling defects seen. Small  pericardial effusion with a maximum thickness of 8 mm. Mediastinum/Nodes: Soft tissue density in the precarinal mediastinum on the right, previously measuring 3.8 cm in maximum diameter, currently measures 2.9 cm and maximum diameter. The previously demonstrated 10 mm short axis subcarinal node continues to have a 10 mm short axis diameter on image number 43 of series 4. No enlarged lymph nodes elsewhere in the mediastinum. Unremarkable included thyroid gland. Lungs/Pleura: Postradiation changes in the medial aspect of the right lung are stable. Clear left lung. Musculoskeletal: Thoracic spine degenerative changes. Review of the MIP images confirms the above findings. CT ABDOMEN and PELVIS FINDINGS Hepatobiliary: Multiple large, minimally calcified gallstones in the gallbladder. The largest measures 1.6 cm in maximum diameter. No gallbladder wall thickening or pericholecystic fluid. Unremarkable liver. Pancreas: Unremarkable. No pancreatic ductal dilatation or surrounding inflammatory changes. Spleen: Normal in size without focal abnormality. Adrenals/Urinary Tract: Adrenal glands are unremarkable. Kidneys are normal, without renal calculi, focal lesion, or hydronephrosis. Bladder is unremarkable. Stomach/Bowel: Unremarkable stomach, small bowel and colon. No evidence of appendicitis. Vascular/Lymphatic: No significant vascular findings are present. No enlarged abdominal or pelvic lymph nodes. Reproductive: Multiple uterine masses, measuring up to 4.1 cm in maximum diameter each. Normal appearing ovaries. Other: Lobulated, oval, heterogeneous enhancing mass in the right lateral subcutaneous fat at the level of the mid abdomen. This mass measures 5.0 x 4.7 cm in maximum dimensions on image number 48 of series 5. This measures 5.0 cm in length on coronal image number 91. Very small umbilical hernia containing fat. Musculoskeletal: Minimal lumbar spine degenerative changes. No evidence of bony metastatic disease.  Review of the MIP images confirms the above findings. IMPRESSION: 1. No pulmonary emboli. 2. No acute chest, abdomen or pelvic abnormality. 3. Interval 5.0 x 5.0 x 4.7 cm mass in the right lateral subcutaneous fat at the level of the mid abdomen. This has CT features compatible with a metastasis or primary neoplasm. This would be amenable to ultrasound-guided core needle biopsy. 4. Interval decrease in size of the previously demonstrated precarinal mediastinal mass. 5. Stable mildly enlarged subcarinal lymph node. 6. Stable right lung postradiation changes. 7. Cholelithiasis without evidence of cholecystitis. 8. Small pericardial effusion with a maximum thickness of 8 mm. Electronically Signed   By: Claudie Revering M.D.   On: 10/29/2016 15:52    Procedures Procedures (including critical care time)  Medications Ordered in ED Medications  HYDROmorphone (DILAUDID) injection 1 mg (not administered)  sodium chloride 0.9 % bolus 500 mL (500 mLs Intravenous New Bag/Given 10/29/16 1421)  ondansetron (ZOFRAN) injection 4 mg (4 mg Intravenous Given 10/29/16 1421)  iopamidol (ISOVUE-370) 76 % injection (100 mLs Intravenous Contrast Given 10/29/16 1521)  Initial Impression / Assessment and Plan / ED Course  I have reviewed the triage vital signs and the nursing notes.  Pertinent labs & imaging results that were available during my care of the patient were reviewed by me and considered in my medical decision making (see chart for details).     Patient, with past medical history of lung cancer, presents to ED for generalized weakness and dizziness that began today. She also reports chest pain and shortness of breath. She also reports a mass on the right side of her abdomen present for one week. She denies any previous history of PE, DVT, MI, hemoptysis or recent surgeries. She finished her chemotherapy about 7 months ago. She has been on biweekly antibiotics to prevent any further infection. On physical exam  there is a palpable mass on the right side of her abdomen. She is afebrile with no history of fever. Heart rate in 100s. EKG showed normal sinus rhythm and no changes from previous tracings. Chest x-ray was negative for acute disease. Troponin negative 1. CBC and BMP unremarkable. D-dimer was elevated. We will obtain CT of the abdomen and CT of the chest to rule out PE.  1600: CT of chest negative for pulmonary embolism. Low suspicion for ACS being the cause of her chest pain. CT of the abdomen showed interval mass in the right lateral subcutaneous fat of the mid abdomen. This could possibly be metastasis or primary neoplasm. Patient will need biopsy for further evaluation. I informed patient of these findings encouraged her to follow up at the cancer clinic for further evaluation. She states that she has an appointment with her oncologist on Monday. Pain controlled here in the ED with Dilaudid. Patient appears stable for discharge at this time. Strict return precautions given.   Final Clinical Impressions(s) / ED Diagnoses   Final diagnoses:  Dizziness  Generalized weakness    New Prescriptions New Prescriptions   No medications on file     Delia Heady, PA-C 10/29/16 [place], DO 10/29/16 Curly Rim

## 2016-10-29 NOTE — ED Notes (Signed)
Bed: PY19 Expected date:  Expected time:  Means of arrival:  Comments: EMS-weakness

## 2016-10-29 NOTE — Discharge Instructions (Signed)
Please read attached information regarding her condition. Continue home medications as previously prescribed for pain and nausea. Follow-up with your oncologist as soon as possible for further evaluation. Return to ED for worsening chest pain, increased shortness of breath, coughing up blood, leg swelling, head injury, loss of consciousness.

## 2016-11-01 ENCOUNTER — Encounter: Payer: Self-pay | Admitting: Hematology

## 2016-11-01 ENCOUNTER — Telehealth: Payer: Self-pay

## 2016-11-01 ENCOUNTER — Ambulatory Visit: Payer: Medicaid Other

## 2016-11-01 ENCOUNTER — Other Ambulatory Visit: Payer: Self-pay

## 2016-11-01 ENCOUNTER — Other Ambulatory Visit (HOSPITAL_BASED_OUTPATIENT_CLINIC_OR_DEPARTMENT_OTHER): Payer: Medicaid Other

## 2016-11-01 ENCOUNTER — Other Ambulatory Visit: Payer: Medicaid Other

## 2016-11-01 ENCOUNTER — Ambulatory Visit (HOSPITAL_BASED_OUTPATIENT_CLINIC_OR_DEPARTMENT_OTHER): Payer: Medicaid Other | Admitting: Hematology

## 2016-11-01 ENCOUNTER — Ambulatory Visit (HOSPITAL_BASED_OUTPATIENT_CLINIC_OR_DEPARTMENT_OTHER): Payer: Medicaid Other

## 2016-11-01 VITALS — BP 136/90 | HR 118 | Temp 98.3°F | Resp 16 | Ht 69.5 in | Wt 273.1 lb

## 2016-11-01 DIAGNOSIS — G893 Neoplasm related pain (acute) (chronic): Secondary | ICD-10-CM | POA: Diagnosis not present

## 2016-11-01 DIAGNOSIS — R1900 Intra-abdominal and pelvic swelling, mass and lump, unspecified site: Secondary | ICD-10-CM | POA: Diagnosis not present

## 2016-11-01 DIAGNOSIS — R5383 Other fatigue: Secondary | ICD-10-CM | POA: Diagnosis not present

## 2016-11-01 DIAGNOSIS — R51 Headache: Secondary | ICD-10-CM

## 2016-11-01 DIAGNOSIS — E119 Type 2 diabetes mellitus without complications: Secondary | ICD-10-CM | POA: Diagnosis not present

## 2016-11-01 DIAGNOSIS — I10 Essential (primary) hypertension: Secondary | ICD-10-CM | POA: Diagnosis not present

## 2016-11-01 DIAGNOSIS — R05 Cough: Secondary | ICD-10-CM

## 2016-11-01 DIAGNOSIS — C3491 Malignant neoplasm of unspecified part of right bronchus or lung: Secondary | ICD-10-CM

## 2016-11-01 DIAGNOSIS — D6481 Anemia due to antineoplastic chemotherapy: Secondary | ICD-10-CM

## 2016-11-01 DIAGNOSIS — C3492 Malignant neoplasm of unspecified part of left bronchus or lung: Secondary | ICD-10-CM

## 2016-11-01 DIAGNOSIS — R059 Cough, unspecified: Secondary | ICD-10-CM

## 2016-11-01 DIAGNOSIS — E039 Hypothyroidism, unspecified: Secondary | ICD-10-CM | POA: Diagnosis not present

## 2016-11-01 DIAGNOSIS — R519 Headache, unspecified: Secondary | ICD-10-CM

## 2016-11-01 DIAGNOSIS — Z5112 Encounter for antineoplastic immunotherapy: Secondary | ICD-10-CM

## 2016-11-01 DIAGNOSIS — E032 Hypothyroidism due to medicaments and other exogenous substances: Secondary | ICD-10-CM

## 2016-11-01 LAB — CBC & DIFF AND RETIC
BASO%: 0.2 % (ref 0.0–2.0)
BASOS ABS: 0 10*3/uL (ref 0.0–0.1)
EOS ABS: 0.3 10*3/uL (ref 0.0–0.5)
EOS%: 4 % (ref 0.0–7.0)
HCT: 30.7 % — ABNORMAL LOW (ref 34.8–46.6)
HEMOGLOBIN: 9.9 g/dL — AB (ref 11.6–15.9)
IMMATURE RETIC FRACT: 4.6 % (ref 1.60–10.00)
LYMPH#: 1.4 10*3/uL (ref 0.9–3.3)
LYMPH%: 22 % (ref 14.0–49.7)
MCH: 28.5 pg (ref 25.1–34.0)
MCHC: 32.2 g/dL (ref 31.5–36.0)
MCV: 88.5 fL (ref 79.5–101.0)
MONO#: 0.3 10*3/uL (ref 0.1–0.9)
MONO%: 4.3 % (ref 0.0–14.0)
NEUT%: 69.5 % (ref 38.4–76.8)
NEUTROS ABS: 4.5 10*3/uL (ref 1.5–6.5)
NRBC: 0 % (ref 0–0)
Platelets: 285 10*3/uL (ref 145–400)
RBC: 3.47 10*6/uL — AB (ref 3.70–5.45)
RDW: 13.9 % (ref 11.2–14.5)
RETIC %: 1.87 % (ref 0.70–2.10)
Retic Ct Abs: 64.89 10*3/uL (ref 33.70–90.70)
WBC: 6.5 10*3/uL (ref 3.9–10.3)

## 2016-11-01 LAB — COMPREHENSIVE METABOLIC PANEL
ALBUMIN: 3.2 g/dL — AB (ref 3.5–5.0)
ALK PHOS: 81 U/L (ref 40–150)
ALT: 9 U/L (ref 0–55)
AST: 15 U/L (ref 5–34)
Anion Gap: 6 mEq/L (ref 3–11)
BUN: 9.8 mg/dL (ref 7.0–26.0)
CO2: 28 meq/L (ref 22–29)
Calcium: 8.7 mg/dL (ref 8.4–10.4)
Chloride: 102 mEq/L (ref 98–109)
Creatinine: 0.9 mg/dL (ref 0.6–1.1)
EGFR: >90 mL/min/{1.73_m2} (ref >90)
GLUCOSE: 242 mg/dL — AB (ref 70–140)
POTASSIUM: 3.8 meq/L (ref 3.5–5.1)
SODIUM: 136 meq/L (ref 136–145)
Total Bilirubin: 0.26 mg/dL (ref 0.20–1.20)
Total Protein: 7.2 g/dL (ref 6.4–8.3)

## 2016-11-01 MED ORDER — DIPHENHYDRAMINE HCL 25 MG PO TABS
25.0000 mg | ORAL_TABLET | Freq: Once | ORAL | Status: AC
  Administered 2016-11-01: 25 mg via ORAL
  Filled 2016-11-01: qty 1

## 2016-11-01 MED ORDER — SODIUM CHLORIDE 0.9 % IV SOLN
Freq: Once | INTRAVENOUS | Status: AC
  Administered 2016-11-01: 13:00:00 via INTRAVENOUS

## 2016-11-01 MED ORDER — OXYCODONE-ACETAMINOPHEN 5-325 MG PO TABS: 1.0000 | ORAL_TABLET | Freq: Four times a day (QID) | ORAL | 0 refills | Status: DC | PRN

## 2016-11-01 MED ORDER — SODIUM CHLORIDE 0.9 % IV SOLN
9.5000 mg/kg | Freq: Once | INTRAVENOUS | Status: AC
  Administered 2016-11-01: 1120 mg via INTRAVENOUS
  Filled 2016-11-01: qty 20

## 2016-11-01 MED ORDER — SODIUM CHLORIDE 0.9% FLUSH
3.0000 mL | INTRAVENOUS | Status: DC | PRN
  Administered 2016-11-01: 3 mL via INTRAVENOUS
  Filled 2016-11-01: qty 10

## 2016-11-01 MED ORDER — BENZONATATE 100 MG PO CAPS: 100.0000 mg | ORAL_CAPSULE | Freq: Three times a day (TID) | ORAL | 0 refills | Status: DC | PRN

## 2016-11-01 MED ORDER — HEPARIN SOD (PORK) LOCK FLUSH 100 UNIT/ML IV SOLN
250.0000 [IU] | Freq: Once | INTRAVENOUS | Status: AC | PRN
Start: 2016-11-01 — End: 2016-11-01
  Administered 2016-11-01: 250 [IU]
  Filled 2016-11-01: qty 5

## 2016-11-01 MED ORDER — LEVOTHYROXINE SODIUM 88 MCG PO TABS: 88.0000 ug | ORAL_TABLET | Freq: Every day | ORAL | 2 refills | Status: DC

## 2016-11-01 MED ORDER — ACETAMINOPHEN 325 MG PO TABS
ORAL_TABLET | ORAL | Status: AC
  Filled 2016-11-01: qty 2

## 2016-11-01 MED ORDER — DIPHENHYDRAMINE HCL 25 MG PO CAPS
ORAL_CAPSULE | ORAL | Status: AC
  Filled 2016-11-01: qty 1

## 2016-11-01 MED ORDER — ACETAMINOPHEN 325 MG PO TABS
650.0000 mg | ORAL_TABLET | Freq: Once | ORAL | Status: AC
  Administered 2016-11-01: 650 mg via ORAL

## 2016-11-01 NOTE — Patient Instructions (Signed)
Thank you for choosing [facility] to provide your oncology and hematology care.  To afford each patient quality time with our providers, please arrive 30 minutes before your scheduled appointment time.  If you arrive late for your appointment, you may be asked to reschedule.  We strive to give you quality time with our providers, and arriving late affects you and other patients whose appointments are after yours.  If you are a no show for multiple scheduled visits, you may be dismissed from the clinic at the providers discretion.   Again, thank you for choosing [facility], our hope is that these requests will decrease the amount of time that you wait before being seen by our physicians.  ______________________________________________________________________ Should you have questions after your visit to the [facility], please contact our office at [phone] between the hours of 8:30 and 4:30 p.m.    Voicemails left after 4:30p.m will not be returned until the following business day.   For prescription refill requests, please have your pharmacy contact us directly.  Please also try to allow 48 hours for prescription requests.   Please contact the scheduling department for questions regarding scheduling.  For scheduling of procedures such as PET scans, CT scans, MRI, Ultrasound, etc please contact central scheduling at [phone].   Resources For Cancer Patients and Caregivers:  American Cancer Society:  800-227-2345  Can help patients locate various types of support and financial assistance Cancer Care: 1-800-813-HOPE (4673) Provides financial assistance, online support groups, medication/co-pay assistance.   [place] DSS:  336-641-3447 Where to apply for food stamps, Medicaid, and utility assistance Medicare Rights Center: 800-333-4114 Helps people with Medicare understand their rights and benefits, navigate the Medicare system, and secure the  quality healthcare they deserve SCAT: 336-333-6589 [place] [employer]'s shared-ride transportation service for eligible riders who have a disability that prevents them from riding the fixed route bus.   For additional information on assistance programs please contact our social worker:   Grier Hock/Abigail Elmore:  [phone] 

## 2016-11-01 NOTE — Patient Instructions (Signed)
[facility] Discharge Instructions for Patients Receiving Chemotherapy  Today you received the following chemotherapy agents Imfinzi  To help prevent nausea and vomiting after your treatment, we encourage you to take your nausea medication    If you develop nausea and vomiting that is not controlled by your nausea medication, call the clinic.   BELOW ARE SYMPTOMS THAT SHOULD BE REPORTED IMMEDIATELY:  *FEVER GREATER THAN 100.5 F  *CHILLS WITH OR WITHOUT FEVER  NAUSEA AND VOMITING THAT IS NOT CONTROLLED WITH YOUR NAUSEA MEDICATION  *UNUSUAL SHORTNESS OF BREATH  *UNUSUAL BRUISING OR BLEEDING  TENDERNESS IN MOUTH AND THROAT WITH OR WITHOUT PRESENCE OF ULCERS  *URINARY PROBLEMS  *BOWEL PROBLEMS  UNUSUAL RASH Items with * indicate a potential emergency and should be followed up as soon as possible.  Feel free to call the clinic you have any questions or concerns. The clinic phone number is [phone].  Please show the [employer] at check-in to the Emergency Department and triage nurse.

## 2016-11-01 NOTE — Progress Notes (Addendum)
Marilyn [name]    HEMATOLOGY/ONCOLOGY CLINIC NOTE  Date of Service: .11/01/2016  Patient Care Team: [name], FNP as PCP - General (Nurse Practitioner)  CHIEF COMPLAINTS/PURPOSE OF CONSULTATION:  f/u Lung Adenocarcinoma.  Diagnosis Stage IIIB Lung Adenocarcinoma  Current treatment: -Started Maintainence Durvalumab from  05/24/2016 q2weeks  PreviousTreatment -Concurrent Chemo-radiation with Carboplatin/Taxol. Completed RT on 02/13/2016 and has then completed 2 cycles of carboplatin + taxol.  HISTORY OF PRESENTING ILLNESS: Plz see my previous note for details on initial presentation.  INTERVAL HISTORY  Patient is here for her scheduled followup for prior to next dose of maintenance Durvalumab for her stage IIIB lung adenocarcinoma.  She was recently in the emergency room for generalized weakness and some chest tightness and right flank painful mass. She had a CT of the chest which showed no evidence of PE and no evidence of progression of her lung cancer. She had a CT of the abdomen which shows about a 5 cm mass in her right abdominal wall/flank. We discussed that this will need to be biopsied and an ultrasound-guided biopsy has been requested. Patient was given a refill on her pain medications to help with the pain related to this. Patient denies any trauma in this area. She notes that she has been fatigued since she has been the primary caregiver for her recently paralyzed son who has yet to receive Medicaid benefits. Patient notes she is having to take care of all his needs at home.  Patient notes some mild cough and is requesting Tessalon Perles refills. She also notes some nonspecific frontal headache which is likely a stress headache though with the recent findings of right flank mass would get a CT of the head to rule out metastases.  Patient's previous thyroid function tests suggest inadequate levothyroxine replacement/inappropriate use whether the patient. She has been  counseled on appropriate use of the levothyroxine. Her dose was increased from 50 g to 88 g. She notes some cold intolerance.   MEDICAL HISTORY:  Past Medical History:  Diagnosis Date  . Asthma   . Diabetes mellitus   . Hypercholesteremia   . Hypertension   . Lung cancer ([place])   . Neuropathy   . Obese     SURGICAL HISTORY: Past Surgical History:  Procedure Laterality Date  . CESAREAN SECTION    . IR GENERIC HISTORICAL  01/08/2016   IR US GUIDE VASC ACCESS RIGHT 01/08/2016 [name], DO [facility]  . IR GENERIC HISTORICAL  01/08/2016   IR FLUORO GUIDE CV LINE RIGHT 01/08/2016 [name], DO [facility]  . TUBAL LIGATION    . US guided core needle biopsy     of right lower neck/supraclavicular lymph nodes    SOCIAL HISTORY: Social History   Social History  . Marital status: Divorced    Spouse name: N/A  . Number of children: N/A  . Years of education: N/A   Occupational History  . Not on file.   Social History Main Topics  . Smoking status: Never Smoker  . Smokeless tobacco: Never Used  . Alcohol use No  . Drug use: No  . Sexual activity: Not Currently   Other Topics Concern  . Not on file   Social History Narrative   No bird or mold exposure. No recent travel.    FAMILY HISTORY: Family History  Problem Relation Age of Onset  . Heart failure Mother   . Hypertension Mother   . Cancer Neg Hx   . Rheumatologic disease Neg Hx  ALLERGIES:  has No Known Allergies.  MEDICATIONS:  Current Outpatient Prescriptions  Medication Sig Dispense Refill  . aspirin EC 81 MG tablet Take 1 tablet (81 mg total) by mouth daily. 60 tablet 2  . benzonatate (TESSALON) 100 MG capsule Take 1 capsule (100 mg total) by mouth 3 (three) times daily as needed for cough. 60 capsule 0  . diltiazem (CARDIZEM CD) 180 MG 24 hr capsule Take 1 capsule (180 mg total) by mouth daily. 30 capsule 1  . DULoxetine (CYMBALTA) 60 MG capsule Take 1 capsule (60 mg total) by mouth  daily. 30 capsule 2  . insulin aspart (NOVOLOG) 100 UNIT/ML FlexPen As per sliding scale instructions (Patient taking differently: Inject 0-20 Units into the skin 3 (three) times daily as needed for high blood sugar. As per sliding scale instructions) 15 mL 2  . Insulin Degludec (TRESIBA FLEXTOUCH) 200 UNIT/ML SOPN Inject 100 Units into the skin daily before breakfast.     . levothyroxine (SYNTHROID, LEVOTHROID) 88 MCG tablet Take 1 tablet (88 mcg total) by mouth daily before breakfast. 30 tablet 2  . oxyCODONE-acetaminophen (PERCOCET/ROXICET) 5-325 MG tablet Take 1-2 tablets by mouth every 6 (six) hours as needed for moderate pain or severe pain. 40 tablet 0  . polyethylene glycol (MIRALAX) packet Take 17 g by mouth daily. 30 each 1  . senna-docusate (SENNA S) 8.6-50 MG tablet Take 2 tablets by mouth 2 (two) times daily. May reduce to 2 tab po HS as needed once regular BM estabished 60 tablet 1   No current facility-administered medications for this visit.    Facility-Administered Medications Ordered in Other Visits  Medication Dose Route Frequency Provider Last Rate Last Dose  . sodium chloride 0.9 % injection 10 mL  10 mL Intravenous PRN [name], MD   10 mL at 05/24/16 1252  . sodium chloride flush (NS) 0.9 % injection 3 mL  3 mL Intravenous PRN [name], MD   3 mL at 11/01/16 1518    REVIEW OF SYSTEMS:    10 Point review of Systems was done is negative except as noted above.  PHYSICAL EXAMINATION: ECOG PERFORMANCE STATUS: 1 - Symptomatic but completely ambulatory  . Vitals:   11/01/16 1158  BP: 136/90  Pulse: (!) 118  Resp: 16  Temp: 98.3 F (36.8 C)   Filed Weights   11/01/16 1158  Weight: 273 lb 1.6 oz (123.9 kg)   .Body mass index is 39.75 kg/m.  GENERAL:alert, in no acute distress and comfortable SKIN: radiation related skin injury over neck - healing EYES: normal, conjunctiva are pink and non-injected, sclera clear OROPHARYNX:oral thrush  noted NECK: supple, no JVD, thyroid normal size, non-tender, without nodularity LYMPH:  no palpable lymphadenopathy in the cervical, axillary or inguinal.             LUNGS: clear to auscultation with normal respiratory effort. Distant breath sounds  HEART: regular rate & rhythm,  no murmurs and no lower extremity edema ABDOMEN: abdomen obese, soft, non-tender, normoactive bowel sounds  Musculoskeletal: no cyanosis of digits and no clubbing  PSYCH: alert & oriented x 3 with fluent speech NEURO: no focal motor/sensory deficits  LABORATORY DATA: .  Marilyn [name] CBC Latest Ref Rng & Units 11/01/2016 10/29/2016 10/04/2016  WBC 3.9 - 10.3 10e3/uL 6.5 5.5 6.2  Hemoglobin 11.6 - 15.9 g/dL 9.9(L) 10.0(L) 10.5(L)  Hematocrit 34.8 - 46.6 % 30.7(L) 29.3(L) 31.2(L)  Platelets 145 - 400 10e3/uL 285 282 308   . CMP Latest Ref  Rng & Units 11/01/2016 10/29/2016 10/04/2016  Glucose 70 - 140 mg/dl 242(H) 154(H) 131  BUN 7.0 - 26.0 mg/dL 9.8 9 8.7  Creatinine 0.6 - 1.1 mg/dL 0.9 0.68 0.8  Sodium 136 - 145 mEq/L 136 140 141  Potassium 3.5 - 5.1 mEq/L 3.8 3.5 3.7  Chloride 101 - 111 mmol/L - 103 -  CO2 22 - 29 mEq/L 28 29 31(H)  Calcium 8.4 - 10.4 mg/dL 8.7 8.4(L) 9.6  Total Protein 6.4 - 8.3 g/dL 7.2 - 7.6  Total Bilirubin 0.20 - 1.20 mg/dL 0.26 - 0.38  Alkaline Phos 40 - 150 U/L 81 - 77  AST 5 - 34 U/L 15 - 20  ALT 0 - 55 U/L 9 - 13                RADIOGRAPHIC STUDIES:  .Dg Chest 2 View  Result Date: 10/29/2016 CLINICAL DATA:  Shortness of breath and chest pain EXAM: CHEST  2 VIEW COMPARISON:  09/13/2016 FINDINGS: Postradiation changes are again seen in the right perihilar and suprahilar region. Right-sided PICC line is again noted at the cavoatrial junction. No focal infiltrate or sizable effusion is seen. No acute bony abnormality is noted. IMPRESSION: Chronic changes without acute abnormality. Electronically Signed   By: Inez Catalina M.D.   On: 10/29/2016 13:42   Ct Angio Chest Pe W/cm &/or Wo  Cm  Result Date: 10/29/2016 CLINICAL DATA:  [place].  History of lung cancer.  Right flank mass. EXAM: CT ANGIOGRAPHY CHEST CT ABDOMEN AND PELVIS WITH CONTRAST TECHNIQUE: Multidetector CT imaging of the chest was performed using the standard protocol during bolus administration of intravenous contrast. Multiplanar CT image reconstructions and MIPs were obtained to evaluate the vascular anatomy. Multidetector CT imaging of the abdomen and pelvis was performed using the standard protocol during bolus administration of intravenous contrast. CONTRAST:  100 cc Isovue 370 COMPARISON:  Chest radiographs obtained today. Chest CTA dated 07/05/2016. Chest CT dated 09/13/2016. Abdomen and pelvis CT dated 12/27/2015. FINDINGS: CTA CHEST FINDINGS Cardiovascular: Normally opacified pulmonary arteries with no pulmonary arterial filling defects seen. Small pericardial effusion with a maximum thickness of 8 mm. Mediastinum/Nodes: Soft tissue density in the precarinal mediastinum on the right, previously measuring 3.8 cm in maximum diameter, currently measures 2.9 cm and maximum diameter. The previously demonstrated 10 mm short axis subcarinal node continues to have a 10 mm short axis diameter on image number 43 of series 4. No enlarged lymph nodes elsewhere in the mediastinum. Unremarkable included thyroid gland. Lungs/Pleura: Postradiation changes in the medial aspect of the right lung are stable. Clear left lung. Musculoskeletal: Thoracic spine degenerative changes. Review of the MIP images confirms the above findings. CT ABDOMEN and PELVIS FINDINGS Hepatobiliary: Multiple large, minimally calcified gallstones in the gallbladder. The largest measures 1.6 cm in maximum diameter. No gallbladder wall thickening or pericholecystic fluid. Unremarkable liver. Pancreas: Unremarkable. No pancreatic ductal dilatation or surrounding inflammatory changes. Spleen: Normal in size without focal abnormality. Adrenals/Urinary Tract: Adrenal  glands are unremarkable. Kidneys are normal, without renal calculi, focal lesion, or hydronephrosis. Bladder is unremarkable. Stomach/Bowel: Unremarkable stomach, small bowel and colon. No evidence of appendicitis. Vascular/Lymphatic: No significant vascular findings are present. No enlarged abdominal or pelvic lymph nodes. Reproductive: Multiple uterine masses, measuring up to 4.1 cm in maximum diameter each. Normal appearing ovaries. Other: Lobulated, oval, heterogeneous enhancing mass in the right lateral subcutaneous fat at the level of the mid abdomen. This mass measures 5.0 x 4.7 cm in maximum dimensions on image  number 48 of series 5. This measures 5.0 cm in length on coronal image number 91. Very small umbilical hernia containing fat. Musculoskeletal: Minimal lumbar spine degenerative changes. No evidence of bony metastatic disease. Review of the MIP images confirms the above findings. IMPRESSION: 1. No pulmonary emboli. 2. No acute chest, abdomen or pelvic abnormality. 3. Interval 5.0 x 5.0 x 4.7 cm mass in the right lateral subcutaneous fat at the level of the mid abdomen. This has CT features compatible with a metastasis or primary neoplasm. This would be amenable to ultrasound-guided core needle biopsy. 4. Interval decrease in size of the previously demonstrated precarinal mediastinal mass. 5. Stable mildly enlarged subcarinal lymph node. 6. Stable right lung postradiation changes. 7. Cholelithiasis without evidence of cholecystitis. 8. Small pericardial effusion with a maximum thickness of 8 mm. Electronically Signed   By: Claudie Revering M.D.   On: 10/29/2016 15:52   Ct Abdomen Pelvis W Contrast  Result Date: 10/29/2016 CLINICAL DATA:  [place].  History of lung cancer.  Right flank mass. EXAM: CT ANGIOGRAPHY CHEST CT ABDOMEN AND PELVIS WITH CONTRAST TECHNIQUE: Multidetector CT imaging of the chest was performed using the standard protocol during bolus administration of intravenous contrast.  Multiplanar CT image reconstructions and MIPs were obtained to evaluate the vascular anatomy. Multidetector CT imaging of the abdomen and pelvis was performed using the standard protocol during bolus administration of intravenous contrast. CONTRAST:  100 cc Isovue 370 COMPARISON:  Chest radiographs obtained today. Chest CTA dated 07/05/2016. Chest CT dated 09/13/2016. Abdomen and pelvis CT dated 12/27/2015. FINDINGS: CTA CHEST FINDINGS Cardiovascular: Normally opacified pulmonary arteries with no pulmonary arterial filling defects seen. Small pericardial effusion with a maximum thickness of 8 mm. Mediastinum/Nodes: Soft tissue density in the precarinal mediastinum on the right, previously measuring 3.8 cm in maximum diameter, currently measures 2.9 cm and maximum diameter. The previously demonstrated 10 mm short axis subcarinal node continues to have a 10 mm short axis diameter on image number 43 of series 4. No enlarged lymph nodes elsewhere in the mediastinum. Unremarkable included thyroid gland. Lungs/Pleura: Postradiation changes in the medial aspect of the right lung are stable. Clear left lung. Musculoskeletal: Thoracic spine degenerative changes. Review of the MIP images confirms the above findings. CT ABDOMEN and PELVIS FINDINGS Hepatobiliary: Multiple large, minimally calcified gallstones in the gallbladder. The largest measures 1.6 cm in maximum diameter. No gallbladder wall thickening or pericholecystic fluid. Unremarkable liver. Pancreas: Unremarkable. No pancreatic ductal dilatation or surrounding inflammatory changes. Spleen: Normal in size without focal abnormality. Adrenals/Urinary Tract: Adrenal glands are unremarkable. Kidneys are normal, without renal calculi, focal lesion, or hydronephrosis. Bladder is unremarkable. Stomach/Bowel: Unremarkable stomach, small bowel and colon. No evidence of appendicitis. Vascular/Lymphatic: No significant vascular findings are present. No enlarged abdominal or  pelvic lymph nodes. Reproductive: Multiple uterine masses, measuring up to 4.1 cm in maximum diameter each. Normal appearing ovaries. Other: Lobulated, oval, heterogeneous enhancing mass in the right lateral subcutaneous fat at the level of the mid abdomen. This mass measures 5.0 x 4.7 cm in maximum dimensions on image number 48 of series 5. This measures 5.0 cm in length on coronal image number 91. Very small umbilical hernia containing fat. Musculoskeletal: Minimal lumbar spine degenerative changes. No evidence of bony metastatic disease. Review of the MIP images confirms the above findings. IMPRESSION: 1. No pulmonary emboli. 2. No acute chest, abdomen or pelvic abnormality. 3. Interval 5.0 x 5.0 x 4.7 cm mass in the right lateral subcutaneous fat at the level of  the mid abdomen. This has CT features compatible with a metastasis or primary neoplasm. This would be amenable to ultrasound-guided core needle biopsy. 4. Interval decrease in size of the previously demonstrated precarinal mediastinal mass. 5. Stable mildly enlarged subcarinal lymph node. 6. Stable right lung postradiation changes. 7. Cholelithiasis without evidence of cholecystitis. 8. Small pericardial effusion with a maximum thickness of 8 mm. Electronically Signed   By: Claudie Revering M.D.   On: 10/29/2016 15:52     ASSESSMENT & PLAN:   51 year old African-American female with history of hypertension, diabetes, asthma, dysuria mass and morbid obesity with   #1 Lung Adenocarcinoma Rt sided atleast Stage IIIB with large right paratracheal mass with mediastinal adenopathy that appears to have grown significantly over the last 6-7 months and rt supraclavicular LN +.   Noted to have a small mass in the left adrenal on CT but PET/CT neg for metastatic disease. Patient has been a lifelong nonsmoker  MRI of the brain was negative for any metastatic disease.  High PDL1 expression (90%) on [employer] Neg for EGFR, ALK, ROS-1 and BRAF  mutations.  Patient has completed her planned definitive chemoradiation with carbo Taxol on 02/13/2016. No prohibitive toxicities other than some grade 1 skin desquamation and some grade 1-2 radiation esophagitis. She has subsequently received 2 out of 2 planned dose of carboplatin + Taxol.  Patient has completed 9 doses of maintenance  Durvalumab will receive the 10th dose today  CTA chest 10/29/2016  no evidence of lung cancer progression in the chest. No PE CT abd/pelvis-10/29/2016  Interval 5.0 x 5.0 x 4.7 cm mass in the right lateral subcutaneous fat at the level of the mid abdomen. This has CT features compatible with a metastasis or primary neoplasm. Plan -Will continue maintenance treatment with Durvalumab -10th dose today - requested ultrasound-guided needle biopsy of the right midabdominal soft tissue mass ? Metastases versus hematoma . -Given her refill for Percocet for pain management  -CT head to rule out metastatic disease given headaches .  #2 h/o radiation pneumonitis . #3 Improving fatigue (grade 1) -working with PT once weekly and encourage to walk as much as possible and optimize DM2 management and Mx of newly diagnosed hypothyroidism. #4 normocytic anemia related to chemotherapy -resolving. hgb has improved to 11 #5 Left chest wall pain due to costochondritis - mx with pain medications. CTA x 2 neg for PE Plan -DM2 better controlled -We shall increase her levothyroxine dose to 88 g per day given her symptoms and elevated TSH levels on last check. -Recheck thyroid function tests in 3-4 weeks and adjust levothyroxine dose accordingly .  #6 Sinus tachycardia - on Cardizem CR , no PE on CTA x 2 Plan -Still having sinus tachycardia . Partly from deconditioning and anemia. Seeing Dr. Debara Pickett from cardiology  -continue Cardizem CR 146m po daily -maintain good po hydration.  #7 neoplasm related pain is currently controlled without significant medications. Clinically likely  has sleep apnea . Would need to be careful with sedative medications . Plan -recommend sleep study with PCP  #8 hypertension  diabetes -uncontrolled  Dyslipidemia  Asthma Plan - insulin SS for uncontrolled hyperglycemia in the setting of steroid use with chemotherapy. 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 No focal motor deficits. Given  concern for metastatic lesion and abdominal wall would also rule out metastatic disease to the brain in the setting of her headaches. -CT head ASAP.   US biopsy of rt flank mass in 2-3 days CT head in 3-4 days Continue treatment as per schedule RTC with Dr Irene Limbo in 2weeks with labs with next treatment. Plz schedule next 3 treatments with Durvalumab   All of the patients questions were answered with apparent satisfaction. The patient knows to call the clinic with any problems, questions or concerns.  I spent 30 minutes counseling the patient face to face. The total time spent in the appointment was 40 minutes and more than 50% was on counseling and direct patient cares.    Sullivan Lone MD [place] AAHIVMS [facility] Hematology/Oncology Physician [facility]  (Office):       [phone] (Work cell):  [phone] (Fax):           [phone]

## 2016-11-01 NOTE — Telephone Encounter (Signed)
appts made and avs printed per 7/30 los,patient aware that central scheduling will call with scans   Marilyn [name]

## 2016-11-02 ENCOUNTER — Other Ambulatory Visit: Payer: Self-pay

## 2016-11-02 ENCOUNTER — Telehealth (HOSPITAL_COMMUNITY): Payer: Self-pay

## 2016-11-02 ENCOUNTER — Ambulatory Visit (HOSPITAL_COMMUNITY): Payer: Medicaid Other | Attending: Cardiology

## 2016-11-02 DIAGNOSIS — Z6839 Body mass index (BMI) 39.0-39.9, adult: Secondary | ICD-10-CM | POA: Insufficient documentation

## 2016-11-02 DIAGNOSIS — I313 Pericardial effusion (noninflammatory): Secondary | ICD-10-CM | POA: Diagnosis not present

## 2016-11-02 DIAGNOSIS — R0602 Shortness of breath: Secondary | ICD-10-CM | POA: Diagnosis not present

## 2016-11-02 DIAGNOSIS — R000 Tachycardia, unspecified: Secondary | ICD-10-CM | POA: Diagnosis not present

## 2016-11-02 DIAGNOSIS — E785 Hyperlipidemia, unspecified: Secondary | ICD-10-CM | POA: Insufficient documentation

## 2016-11-02 DIAGNOSIS — R079 Chest pain, unspecified: Secondary | ICD-10-CM | POA: Diagnosis not present

## 2016-11-02 DIAGNOSIS — E119 Type 2 diabetes mellitus without complications: Secondary | ICD-10-CM | POA: Insufficient documentation

## 2016-11-02 DIAGNOSIS — I351 Nonrheumatic aortic (valve) insufficiency: Secondary | ICD-10-CM | POA: Insufficient documentation

## 2016-11-02 DIAGNOSIS — I10 Essential (primary) hypertension: Secondary | ICD-10-CM | POA: Insufficient documentation

## 2016-11-02 NOTE — Telephone Encounter (Signed)
Close encounter 

## 2016-11-03 ENCOUNTER — Ambulatory Visit (HOSPITAL_BASED_OUTPATIENT_CLINIC_OR_DEPARTMENT_OTHER): Payer: Medicaid Other

## 2016-11-03 DIAGNOSIS — Z452 Encounter for adjustment and management of vascular access device: Secondary | ICD-10-CM | POA: Diagnosis not present

## 2016-11-03 DIAGNOSIS — Z95828 Presence of other vascular implants and grafts: Secondary | ICD-10-CM

## 2016-11-03 DIAGNOSIS — C3491 Malignant neoplasm of unspecified part of right bronchus or lung: Secondary | ICD-10-CM

## 2016-11-03 MED ORDER — HEPARIN SOD (PORK) LOCK FLUSH 100 UNIT/ML IV SOLN
250.0000 [IU] | Freq: Once | INTRAVENOUS | Status: AC
  Administered 2016-11-03: 250 [IU]
  Filled 2016-11-03: qty 5

## 2016-11-03 MED ORDER — SODIUM CHLORIDE 0.9% FLUSH
10.0000 mL | Freq: Once | INTRAVENOUS | Status: AC
  Administered 2016-11-03: 10 mL
  Filled 2016-11-03: qty 10

## 2016-11-04 ENCOUNTER — Ambulatory Visit (HOSPITAL_COMMUNITY)
Admission: RE | Admit: 2016-11-04 | Discharge: 2016-11-04 | Disposition: A | Discharge status: Home / Self Care | Payer: Medicaid Other | Source: Ambulatory Visit | Attending: Cardiology | Admitting: Cardiology

## 2016-11-04 DIAGNOSIS — R9439 Abnormal result of other cardiovascular function study: Secondary | ICD-10-CM | POA: Diagnosis not present

## 2016-11-04 DIAGNOSIS — R079 Chest pain, unspecified: Secondary | ICD-10-CM | POA: Diagnosis not present

## 2016-11-04 DIAGNOSIS — Z8249 Family history of ischemic heart disease and other diseases of the circulatory system: Secondary | ICD-10-CM | POA: Insufficient documentation

## 2016-11-04 DIAGNOSIS — E079 Disorder of thyroid, unspecified: Secondary | ICD-10-CM | POA: Insufficient documentation

## 2016-11-04 DIAGNOSIS — R000 Tachycardia, unspecified: Secondary | ICD-10-CM | POA: Diagnosis not present

## 2016-11-04 DIAGNOSIS — Z6838 Body mass index (BMI) 38.0-38.9, adult: Secondary | ICD-10-CM | POA: Insufficient documentation

## 2016-11-04 DIAGNOSIS — R002 Palpitations: Secondary | ICD-10-CM | POA: Diagnosis not present

## 2016-11-04 DIAGNOSIS — I2510 Atherosclerotic heart disease of native coronary artery without angina pectoris: Secondary | ICD-10-CM | POA: Diagnosis not present

## 2016-11-04 DIAGNOSIS — R0602 Shortness of breath: Secondary | ICD-10-CM | POA: Diagnosis not present

## 2016-11-04 DIAGNOSIS — E669 Obesity, unspecified: Secondary | ICD-10-CM | POA: Diagnosis not present

## 2016-11-04 MED ORDER — TECHNETIUM TC 99M TETROFOSMIN IV KIT
28.2000 | PACK | Freq: Once | INTRAVENOUS | Status: AC | PRN
  Administered 2016-11-04: 28.2 via INTRAVENOUS
  Filled 2016-11-04: qty 29

## 2016-11-04 MED ORDER — REGADENOSON 0.4 MG/5ML IV SOLN
0.4000 mg | Freq: Once | INTRAVENOUS | Status: AC
  Administered 2016-11-04: 0.4 mg via INTRAVENOUS

## 2016-11-05 ENCOUNTER — Ambulatory Visit (HOSPITAL_COMMUNITY)
Admission: RE | Admit: 2016-11-05 | Discharge: 2016-11-05 | Disposition: A | Discharge status: Home / Self Care | Payer: Medicaid Other | Source: Ambulatory Visit | Attending: Cardiovascular Disease | Admitting: Cardiovascular Disease

## 2016-11-05 ENCOUNTER — Ambulatory Visit (HOSPITAL_BASED_OUTPATIENT_CLINIC_OR_DEPARTMENT_OTHER): Payer: Medicaid Other

## 2016-11-05 DIAGNOSIS — C3491 Malignant neoplasm of unspecified part of right bronchus or lung: Secondary | ICD-10-CM

## 2016-11-05 DIAGNOSIS — Z452 Encounter for adjustment and management of vascular access device: Secondary | ICD-10-CM

## 2016-11-05 LAB — MYOCARDIAL PERFUSION IMAGING
CHL CUP NUCLEAR SRS: 0
CSEPPHR: 117 {beats}/min
LV dias vol: 83 mL (ref 46–106)
LVSYSVOL: 40 mL
NUC STRESS TID: 1.01
Rest HR: 103 {beats}/min
SDS: 6
SSS: 6

## 2016-11-05 MED ORDER — HEPARIN SOD (PORK) LOCK FLUSH 100 UNIT/ML IV SOLN
250.0000 [IU] | Freq: Once | INTRAVENOUS | Status: AC
  Administered 2016-11-05: 250 [IU]
  Filled 2016-11-05: qty 5

## 2016-11-05 MED ORDER — TECHNETIUM TC 99M TETROFOSMIN IV KIT
30.6000 | PACK | Freq: Once | INTRAVENOUS | Status: AC | PRN
  Administered 2016-11-05: 30.6 via INTRAVENOUS

## 2016-11-05 MED ORDER — SODIUM CHLORIDE 0.9% FLUSH
10.0000 mL | Freq: Once | INTRAVENOUS | Status: AC
  Administered 2016-11-05: 10 mL
  Filled 2016-11-05: qty 10

## 2016-11-07 ENCOUNTER — Encounter: Payer: Self-pay | Admitting: Hematology

## 2016-11-07 ENCOUNTER — Other Ambulatory Visit: Payer: Self-pay | Admitting: Hematology

## 2016-11-08 ENCOUNTER — Ambulatory Visit (HOSPITAL_BASED_OUTPATIENT_CLINIC_OR_DEPARTMENT_OTHER): Payer: Medicaid Other

## 2016-11-08 DIAGNOSIS — C3491 Malignant neoplasm of unspecified part of right bronchus or lung: Secondary | ICD-10-CM

## 2016-11-08 DIAGNOSIS — Z452 Encounter for adjustment and management of vascular access device: Secondary | ICD-10-CM | POA: Diagnosis not present

## 2016-11-08 MED ORDER — HEPARIN SOD (PORK) LOCK FLUSH 100 UNIT/ML IV SOLN
250.0000 [IU] | Freq: Once | INTRAVENOUS | Status: AC
  Administered 2016-11-08: 250 [IU]
  Filled 2016-11-08: qty 5

## 2016-11-08 MED ORDER — SODIUM CHLORIDE 0.9% FLUSH
10.0000 mL | Freq: Once | INTRAVENOUS | Status: AC
  Administered 2016-11-08: 10 mL
  Filled 2016-11-08: qty 10

## 2016-11-09 ENCOUNTER — Other Ambulatory Visit: Payer: Self-pay | Admitting: Hematology

## 2016-11-09 ENCOUNTER — Other Ambulatory Visit: Payer: Self-pay | Admitting: Radiology

## 2016-11-09 ENCOUNTER — Other Ambulatory Visit: Payer: Self-pay | Admitting: General Surgery

## 2016-11-10 ENCOUNTER — Encounter (HOSPITAL_COMMUNITY): Payer: Self-pay

## 2016-11-10 ENCOUNTER — Ambulatory Visit (HOSPITAL_COMMUNITY)
Admission: RE | Admit: 2016-11-10 | Discharge: 2016-11-10 | Disposition: A | Discharge status: Home / Self Care | Payer: Medicaid Other | Source: Ambulatory Visit | Attending: Hematology | Admitting: Hematology

## 2016-11-10 ENCOUNTER — Ambulatory Visit: Payer: Medicaid Other

## 2016-11-10 DIAGNOSIS — J45909 Unspecified asthma, uncomplicated: Secondary | ICD-10-CM | POA: Insufficient documentation

## 2016-11-10 DIAGNOSIS — Z85118 Personal history of other malignant neoplasm of bronchus and lung: Secondary | ICD-10-CM | POA: Diagnosis not present

## 2016-11-10 DIAGNOSIS — C792 Secondary malignant neoplasm of skin: Secondary | ICD-10-CM | POA: Insufficient documentation

## 2016-11-10 DIAGNOSIS — Z7982 Long term (current) use of aspirin: Secondary | ICD-10-CM | POA: Diagnosis not present

## 2016-11-10 DIAGNOSIS — I10 Essential (primary) hypertension: Secondary | ICD-10-CM | POA: Insufficient documentation

## 2016-11-10 DIAGNOSIS — R1900 Intra-abdominal and pelvic swelling, mass and lump, unspecified site: Secondary | ICD-10-CM | POA: Diagnosis present

## 2016-11-10 DIAGNOSIS — Z794 Long term (current) use of insulin: Secondary | ICD-10-CM | POA: Diagnosis not present

## 2016-11-10 DIAGNOSIS — Z79899 Other long term (current) drug therapy: Secondary | ICD-10-CM | POA: Diagnosis not present

## 2016-11-10 DIAGNOSIS — E669 Obesity, unspecified: Secondary | ICD-10-CM | POA: Diagnosis not present

## 2016-11-10 DIAGNOSIS — E7800 Pure hypercholesterolemia, unspecified: Secondary | ICD-10-CM | POA: Insufficient documentation

## 2016-11-10 DIAGNOSIS — E1142 Type 2 diabetes mellitus with diabetic polyneuropathy: Secondary | ICD-10-CM | POA: Insufficient documentation

## 2016-11-10 LAB — GLUCOSE, CAPILLARY: Glucose-Capillary: 283 mg/dL — ABNORMAL HIGH (ref 65–99)

## 2016-11-10 LAB — CBC
HCT: 31.6 % — ABNORMAL LOW (ref 36.0–46.0)
Hemoglobin: 10.5 g/dL — ABNORMAL LOW (ref 12.0–15.0)
MCH: 29.2 pg (ref 26.0–34.0)
MCHC: 33.2 g/dL (ref 30.0–36.0)
MCV: 87.8 fL (ref 78.0–100.0)
Platelets: 317 10*3/uL (ref 150–400)
RBC: 3.6 MIL/uL — ABNORMAL LOW (ref 3.87–5.11)
RDW: 13.4 % (ref 11.5–15.5)
WBC: 8 10*3/uL (ref 4.0–10.5)

## 2016-11-10 LAB — PROTIME-INR
INR: 1
PROTHROMBIN TIME: 13.2 s (ref 11.4–15.2)

## 2016-11-10 LAB — APTT: aPTT: 30 seconds (ref 24–36)

## 2016-11-10 MED ORDER — HEPARIN SOD (PORK) LOCK FLUSH 100 UNIT/ML IV SOLN
250.0000 [IU] | Freq: Once | INTRAVENOUS | Status: DC
  Filled 2016-11-10: qty 5

## 2016-11-10 MED ORDER — SODIUM CHLORIDE 0.9 % IV SOLN
INTRAVENOUS | Status: DC
  Administered 2016-11-10: 12:00:00 via INTRAVENOUS

## 2016-11-10 MED ORDER — HEPARIN SOD (PORK) LOCK FLUSH 100 UNIT/ML IV SOLN
250.0000 [IU] | INTRAVENOUS | Status: AC | PRN
  Administered 2016-11-10: 250 [IU]
  Filled 2016-11-10: qty 5

## 2016-11-10 MED ORDER — LIDOCAINE HCL 1 % IJ SOLN
INTRAMUSCULAR | Status: AC
  Filled 2016-11-10: qty 20

## 2016-11-10 MED ORDER — MIDAZOLAM HCL 2 MG/2ML IJ SOLN
INTRAMUSCULAR | Status: AC | PRN
  Administered 2016-11-10: 1 mg via INTRAVENOUS

## 2016-11-10 MED ORDER — MIDAZOLAM HCL 2 MG/2ML IJ SOLN
INTRAMUSCULAR | Status: AC
  Filled 2016-11-10: qty 4

## 2016-11-10 MED ORDER — FENTANYL CITRATE (PF) 100 MCG/2ML IJ SOLN
INTRAMUSCULAR | Status: AC | PRN
  Administered 2016-11-10: 50 ug via INTRAVENOUS

## 2016-11-10 MED ORDER — SODIUM CHLORIDE 0.9% FLUSH
10.0000 mL | Freq: Once | INTRAVENOUS | Status: DC
  Filled 2016-11-10: qty 10

## 2016-11-10 MED ORDER — FENTANYL CITRATE (PF) 100 MCG/2ML IJ SOLN
INTRAMUSCULAR | Status: AC
  Filled 2016-11-10: qty 4

## 2016-11-10 NOTE — Procedures (Signed)
Interventional Radiology Procedure Note  Procedure: US guided biopsy of right abd wall mass. Mx 18g core biopsy. .  Complications: None Recommendations:  - Ok to shower tomorrow - Do not submerge for 7 days - Routine care - 1 hr observatoin   Signed,  Dulcy Fanny. Earleen Newport, DO

## 2016-11-10 NOTE — H&P (Signed)
Chief Complaint: Patient was seen in consultation today for right flank mass at the request of [name]  Referring Physician(s): [name]  Supervising Physician: [name]  Patient Status: [facility] - Out-pt  History of Present Illness: Marilyn [name] is a 51 y.o. female with hx of lung cancer who is now found to have a sizeable right flank mass. She is referred for biopsy. PMHx, meds, labs, imaging reviewed. Has been NPO this am. Family at bedside  Past Medical History:  Diagnosis Date  . Asthma   . Diabetes mellitus   . Hypercholesteremia   . Hypertension   . Lung cancer ([place])   . Neuropathy   . Obese     Past Surgical History:  Procedure Laterality Date  . CESAREAN SECTION    . IR GENERIC HISTORICAL  01/08/2016   IR US GUIDE VASC ACCESS RIGHT 01/08/2016 [name], DO [facility]  . IR GENERIC HISTORICAL  01/08/2016   IR FLUORO GUIDE CV LINE RIGHT 01/08/2016 [name], DO [facility]  . TUBAL LIGATION    . US guided core needle biopsy     of right lower neck/supraclavicular lymph nodes    Allergies: Patient has no known allergies.  Medications: Prior to Admission medications   Medication Sig Start Date End Date Taking? Authorizing Provider  levothyroxine (SYNTHROID, LEVOTHROID) 88 MCG tablet Take 1 tablet (88 mcg total) by mouth daily before breakfast. 11/01/16  Yes [name], MD  senna-docusate (SENNA S) 8.6-50 MG tablet Take 2 tablets by mouth 2 (two) times daily. May reduce to 2 tab po HS as needed once regular BM estabished 05/24/16  Yes [name], MD  aspirin EC 81 MG tablet Take 1 tablet (81 mg total) by mouth daily. 07/26/16   [name], MD  benzonatate (TESSALON) 100 MG capsule Take 1 capsule (100 mg total) by mouth 3 (three) times daily as needed for cough. 11/01/16   [name], MD  diltiazem (CARDIZEM CD) 180 MG 24 hr capsule Take 1 capsule (180 mg total) by mouth daily. 08/23/16    [name], MD  DULoxetine (CYMBALTA) 60 MG capsule Take 1 capsule (60 mg total) by mouth daily. 09/20/16   [name], MD  insulin aspart (NOVOLOG) 100 UNIT/ML FlexPen As per sliding scale instructions Patient taking differently: Inject 0-20 Units into the skin 3 (three) times daily as needed for high blood sugar. As per sliding scale instructions 01/16/16   [name], MD  Insulin Degludec (TRESIBA FLEXTOUCH) 200 UNIT/ML SOPN Inject 100 Units into the skin daily before breakfast.     [provider]  oxyCODONE-acetaminophen (PERCOCET/ROXICET) 5-325 MG tablet Take 1-2 tablets by mouth every 6 (six) hours as needed for moderate pain or severe pain. 11/01/16   [name], MD  polyethylene glycol [facility]) packet Take 17 g by mouth daily. 05/24/16   [name], MD     Family History  Problem Relation Age of Onset  . Heart failure Mother   . Hypertension Mother   . Cancer Neg Hx   . Rheumatologic disease Neg Hx     Social History   Social History  . Marital status: Divorced    Spouse name: N/A  . Number of children: N/A  . Years of education: N/A   Social History Main Topics  . Smoking status: Never Smoker  . Smokeless tobacco: Never Used  . Alcohol use No  . Drug use: No  .  Sexual activity: Not Currently   Other Topics Concern  . None   Social History Narrative   No bird or mold exposure. No recent travel.     Review of Systems: A 12 point ROS discussed and pertinent positives are indicated in the HPI above.  All other systems are negative.  Review of Systems  Vital Signs: BP 119/87 (BP Location: Left Arm)   Pulse (!) 120   Temp 98 F (36.7 C) (Oral)   Resp 18   Ht 5' 9.5" (1.765 m)   Wt 267 lb 3.2 oz (121.2 kg)   SpO2 100%   BMI 38.89 kg/m   Physical Exam  Constitutional: She is oriented to person, place, and time. She appears well-developed. No distress.  HENT:  Head: Normocephalic.  Mouth/Throat:  Oropharynx is clear and moist.  Neck: Normal range of motion. No JVD present. No tracheal deviation present.  Cardiovascular: Normal rate, regular rhythm and normal heart sounds.   Pulmonary/Chest: Effort normal and breath sounds normal. No respiratory distress.  Neurological: She is alert and oriented to person, place, and time.  Psychiatric: She has a normal mood and affect. Judgment normal.    Mallampati Score:  MD Evaluation Airway: WNL Heart: WNL Abdomen: WNL Chest/ Lungs: WNL ASA  Classification: 2 Mallampati/Airway Score: Two  Imaging: Dg Chest 2 View  Result Date: 10/29/2016 CLINICAL DATA:  Shortness of breath and chest pain EXAM: CHEST  2 VIEW COMPARISON:  09/13/2016 FINDINGS: Postradiation changes are again seen in the right perihilar and suprahilar region. Right-sided PICC line is again noted at the cavoatrial junction. No focal infiltrate or sizable effusion is seen. No acute bony abnormality is noted. IMPRESSION: Chronic changes without acute abnormality. Electronically Signed   By: Inez Catalina M.D.   On: 10/29/2016 13:42   Ct Angio Chest Pe W/cm &/or Wo Cm  Result Date: 10/29/2016 CLINICAL DATA:  [place].  History of lung cancer.  Right flank mass. EXAM: CT ANGIOGRAPHY CHEST CT ABDOMEN AND PELVIS WITH CONTRAST TECHNIQUE: Multidetector CT imaging of the chest was performed using the standard protocol during bolus administration of intravenous contrast. Multiplanar CT image reconstructions and MIPs were obtained to evaluate the vascular anatomy. Multidetector CT imaging of the abdomen and pelvis was performed using the standard protocol during bolus administration of intravenous contrast. CONTRAST:  100 cc Isovue 370 COMPARISON:  Chest radiographs obtained today. Chest CTA dated 07/05/2016. Chest CT dated 09/13/2016. Abdomen and pelvis CT dated 12/27/2015. FINDINGS: CTA CHEST FINDINGS Cardiovascular: Normally opacified pulmonary arteries with no pulmonary arterial filling  defects seen. Small pericardial effusion with a maximum thickness of 8 mm. Mediastinum/Nodes: Soft tissue density in the precarinal mediastinum on the right, previously measuring 3.8 cm in maximum diameter, currently measures 2.9 cm and maximum diameter. The previously demonstrated 10 mm short axis subcarinal node continues to have a 10 mm short axis diameter on image number 43 of series 4. No enlarged lymph nodes elsewhere in the mediastinum. Unremarkable included thyroid gland. Lungs/Pleura: Postradiation changes in the medial aspect of the right lung are stable. Clear left lung. Musculoskeletal: Thoracic spine degenerative changes. Review of the MIP images confirms the above findings. CT ABDOMEN and PELVIS FINDINGS Hepatobiliary: Multiple large, minimally calcified gallstones in the gallbladder. The largest measures 1.6 cm in maximum diameter. No gallbladder wall thickening or pericholecystic fluid. Unremarkable liver. Pancreas: Unremarkable. No pancreatic ductal dilatation or surrounding inflammatory changes. Spleen: Normal in size without focal abnormality. Adrenals/Urinary Tract: Adrenal glands are unremarkable. Kidneys are normal, without renal  calculi, focal lesion, or hydronephrosis. Bladder is unremarkable. Stomach/Bowel: Unremarkable stomach, small bowel and colon. No evidence of appendicitis. Vascular/Lymphatic: No significant vascular findings are present. No enlarged abdominal or pelvic lymph nodes. Reproductive: Multiple uterine masses, measuring up to 4.1 cm in maximum diameter each. Normal appearing ovaries. Other: Lobulated, oval, heterogeneous enhancing mass in the right lateral subcutaneous fat at the level of the mid abdomen. This mass measures 5.0 x 4.7 cm in maximum dimensions on image number 48 of series 5. This measures 5.0 cm in length on coronal image number 91. Very small umbilical hernia containing fat. Musculoskeletal: Minimal lumbar spine degenerative changes. No evidence of bony  metastatic disease. Review of the MIP images confirms the above findings. IMPRESSION: 1. No pulmonary emboli. 2. No acute chest, abdomen or pelvic abnormality. 3. Interval 5.0 x 5.0 x 4.7 cm mass in the right lateral subcutaneous fat at the level of the mid abdomen. This has CT features compatible with a metastasis or primary neoplasm. This would be amenable to ultrasound-guided core needle biopsy. 4. Interval decrease in size of the previously demonstrated precarinal mediastinal mass. 5. Stable mildly enlarged subcarinal lymph node. 6. Stable right lung postradiation changes. 7. Cholelithiasis without evidence of cholecystitis. 8. Small pericardial effusion with a maximum thickness of 8 mm. Electronically Signed   By: Claudie Revering M.D.   On: 10/29/2016 15:52   Ct Abdomen Pelvis W Contrast  Result Date: 10/29/2016 CLINICAL DATA:  [place].  History of lung cancer.  Right flank mass. EXAM: CT ANGIOGRAPHY CHEST CT ABDOMEN AND PELVIS WITH CONTRAST TECHNIQUE: Multidetector CT imaging of the chest was performed using the standard protocol during bolus administration of intravenous contrast. Multiplanar CT image reconstructions and MIPs were obtained to evaluate the vascular anatomy. Multidetector CT imaging of the abdomen and pelvis was performed using the standard protocol during bolus administration of intravenous contrast. CONTRAST:  100 cc Isovue 370 COMPARISON:  Chest radiographs obtained today. Chest CTA dated 07/05/2016. Chest CT dated 09/13/2016. Abdomen and pelvis CT dated 12/27/2015. FINDINGS: CTA CHEST FINDINGS Cardiovascular: Normally opacified pulmonary arteries with no pulmonary arterial filling defects seen. Small pericardial effusion with a maximum thickness of 8 mm. Mediastinum/Nodes: Soft tissue density in the precarinal mediastinum on the right, previously measuring 3.8 cm in maximum diameter, currently measures 2.9 cm and maximum diameter. The previously demonstrated 10 mm short axis subcarinal  node continues to have a 10 mm short axis diameter on image number 43 of series 4. No enlarged lymph nodes elsewhere in the mediastinum. Unremarkable included thyroid gland. Lungs/Pleura: Postradiation changes in the medial aspect of the right lung are stable. Clear left lung. Musculoskeletal: Thoracic spine degenerative changes. Review of the MIP images confirms the above findings. CT ABDOMEN and PELVIS FINDINGS Hepatobiliary: Multiple large, minimally calcified gallstones in the gallbladder. The largest measures 1.6 cm in maximum diameter. No gallbladder wall thickening or pericholecystic fluid. Unremarkable liver. Pancreas: Unremarkable. No pancreatic ductal dilatation or surrounding inflammatory changes. Spleen: Normal in size without focal abnormality. Adrenals/Urinary Tract: Adrenal glands are unremarkable. Kidneys are normal, without renal calculi, focal lesion, or hydronephrosis. Bladder is unremarkable. Stomach/Bowel: Unremarkable stomach, small bowel and colon. No evidence of appendicitis. Vascular/Lymphatic: No significant vascular findings are present. No enlarged abdominal or pelvic lymph nodes. Reproductive: Multiple uterine masses, measuring up to 4.1 cm in maximum diameter each. Normal appearing ovaries. Other: Lobulated, oval, heterogeneous enhancing mass in the right lateral subcutaneous fat at the level of the mid abdomen. This mass measures 5.0 x 4.7 cm  in maximum dimensions on image number 48 of series 5. This measures 5.0 cm in length on coronal image number 91. Very small umbilical hernia containing fat. Musculoskeletal: Minimal lumbar spine degenerative changes. No evidence of bony metastatic disease. Review of the MIP images confirms the above findings. IMPRESSION: 1. No pulmonary emboli. 2. No acute chest, abdomen or pelvic abnormality. 3. Interval 5.0 x 5.0 x 4.7 cm mass in the right lateral subcutaneous fat at the level of the mid abdomen. This has CT features compatible with a metastasis  or primary neoplasm. This would be amenable to ultrasound-guided core needle biopsy. 4. Interval decrease in size of the previously demonstrated precarinal mediastinal mass. 5. Stable mildly enlarged subcarinal lymph node. 6. Stable right lung postradiation changes. 7. Cholelithiasis without evidence of cholecystitis. 8. Small pericardial effusion with a maximum thickness of 8 mm. Electronically Signed   By: Claudie Revering M.D.   On: 10/29/2016 15:52    Labs:  CBC:  Recent Labs  09/20/16 1346 10/04/16 1243 10/29/16 1418 11/01/16 1107  WBC 5.6 6.2 5.5 6.5  HGB 10.4* 10.5* 10.0* 9.9*  HCT 32.7* 31.2* 29.3* 30.7*  PLT 271 308 282 285    COAGS:  Recent Labs  12/28/15 0214 01/08/16 1310  INR 1.08 1.06  APTT  --  34    BMP:  Recent Labs  04/16/16 1010  05/14/16 1156  07/03/16 1720  09/20/16 1346 10/04/16 1243 10/29/16 1418 11/01/16 1107  NA 138  < > 140  < > 137  < > 138 141 140 136  K 3.4*  < > 3.5  < > 3.8  < > 4.0 3.7 3.5 3.8  CL 104  --  103  --  102  --   --   --  103  --   CO2 27  < > 26  < > 27  < > 29 31* 29 28  GLUCOSE 205*  < > 259*  < > 330*  < > 171* 131 154* 242*  BUN 6  < > 7  < > 9  < > 9.0 8.7 9 9.8  CALCIUM 8.5*  < > 8.9  < > 8.9  < > 9.3 9.6 8.4* 8.7  CREATININE 0.40*  < > 0.64  < > 0.54  < > 0.8 0.8 0.68 0.9  GFRNONAA >60  --  >60  --  >60  --   --   --  >60  --   GFRAA >60  --  >60  --  >60  --   --   --  >60  --   < > = values in this interval not displayed.  LIVER FUNCTION TESTS:  Recent Labs  09/06/16 1156 09/20/16 1346 10/04/16 1243 11/01/16 1107  BILITOT 0.29 0.23 0.38 0.26  AST 20 20 20 15   ALT 11 13 13 9   ALKPHOS 67 73 77 81  PROT 7.2 7.4 7.6 7.2  ALBUMIN 3.3* 3.4* 3.4* 3.2*    TUMOR MARKERS: No results for input(s): AFPTM, CEA, CA199, CHROMGRNA in the last 8760 hours.  Assessment and Plan: (R)flank mass  Hx of lung cancer For US guided biopsy of right flank mass Labs pending Risks and benefits discussed with the patient  including, but not limited to bleeding, infection, damage to adjacent structures or low yield requiring additional tests. All of the patient's questions were answered, patient is agreeable to proceed. Consent signed and in chart.    Thank you for this interesting consult.  I greatly enjoyed meeting Marilyn [name] and look forward to participating in their care.  A copy of this report was sent to the requesting provider on this date.  Electronically Signed: Ascencion Dike, PA-C 11/10/2016, 11:56 AM   I spent a total of 20 minutes in face to face in clinical consultation, greater than 50% of which was counseling/coordinating care for biopsy of right flank mass

## 2016-11-10 NOTE — Discharge Instructions (Signed)
Needle Biopsy, Care After These instructions give you information about caring for yourself after your procedure. Your doctor may also give you more specific instructions. Call your doctor if you have any problems or questions after your procedure. Follow these instructions at home:  Rest as told by your doctor.  Take medicines only as told by your doctor.  Follow instructions from your doctor about: ? How to take care of your biopsy site. ? When and how you should change your bandage (dressing).  Remove dressing tomorrow. ? Remove dressing tomorrow ? Shower tomorrow  Check your biopsy site every day for signs of infection. Watch for: ? Redness, swelling, or pain. ? Fluid, blood, or pus. Contact a doctor if:  You have a fever.  You have redness, swelling, or pain at the biopsy site, and it lasts longer than a few days.  You have fluid, blood, or pus coming from the biopsy site.  You feel sick to your stomach (nauseous).  You throw up (vomit). Get help right away if:  You are short of breath.  You have trouble breathing.  Your chest hurts.  You feel dizzy or you pass out (faint).  You have bleeding that does not stop with pressure or a bandage.  You cough up blood.  Your belly (abdomen) hurts. This information is not intended to replace advice given to you by your health care provider. Make sure you discuss any questions you have with your health care provider. Document Released: 03/04/2008 Document Revised: 08/28/2015 Document Reviewed: 03/18/2014 Elsevier Interactive Patient Education  2018 [place].   Moderate Conscious Sedation, Adult, Care After These instructions provide you with information about caring for yourself after your procedure. Your health care provider may also give you more specific instructions. Your treatment has been planned according to current medical practices, but problems sometimes occur. Call your health care provider if you have any  problems or questions after your procedure. What can I expect after the procedure? After your procedure, it is common:  To feel sleepy for several hours.  To feel clumsy and have poor balance for several hours.  To have poor judgment for several hours.  To vomit if you eat too soon.  Follow these instructions at home: For at least 24 hours after the procedure:   Do not: ? Participate in activities where you could fall or become injured. ? Drive. ? Use heavy machinery. ? Drink alcohol. ? Take sleeping pills or medicines that cause drowsiness. ? Make important decisions or sign legal documents. ? Take care of children on your own.  Rest. Eating and drinking  Follow the diet recommended by your health care provider.  If you vomit: ? Drink water, juice, or soup when you can drink without vomiting. ? Make sure you have little or no nausea before eating solid foods. General instructions  Have a responsible adult stay with you until you are awake and alert.  Take over-the-counter and prescription medicines only as told by your health care provider.  If you smoke, do not smoke without supervision.  Keep all follow-up visits as told by your health care provider. This is important. Contact a health care provider if:  You keep feeling nauseous or you keep vomiting.  You feel light-headed.  You develop a rash.  You have a fever. Get help right away if:  You have trouble breathing. This information is not intended to replace advice given to you by your health care provider. Make sure you discuss any  questions you have with your health care provider. Document Released: 01/10/2013 Document Revised: 08/25/2015 Document Reviewed: 07/12/2015 Elsevier Interactive Patient Education  [employer].

## 2016-11-12 ENCOUNTER — Other Ambulatory Visit: Payer: Self-pay | Admitting: Hematology

## 2016-11-12 ENCOUNTER — Ambulatory Visit (HOSPITAL_BASED_OUTPATIENT_CLINIC_OR_DEPARTMENT_OTHER): Payer: Medicaid Other

## 2016-11-12 DIAGNOSIS — C3491 Malignant neoplasm of unspecified part of right bronchus or lung: Secondary | ICD-10-CM | POA: Diagnosis not present

## 2016-11-12 DIAGNOSIS — Z95828 Presence of other vascular implants and grafts: Secondary | ICD-10-CM

## 2016-11-12 DIAGNOSIS — R51 Headache: Principal | ICD-10-CM

## 2016-11-12 DIAGNOSIS — Z452 Encounter for adjustment and management of vascular access device: Secondary | ICD-10-CM | POA: Diagnosis not present

## 2016-11-12 DIAGNOSIS — R519 Headache, unspecified: Secondary | ICD-10-CM

## 2016-11-12 MED ORDER — SODIUM CHLORIDE 0.9 % IJ SOLN
10.0000 mL | INTRAMUSCULAR | Status: DC | PRN
  Administered 2016-11-12: 10 mL via INTRAVENOUS
  Filled 2016-11-12: qty 10

## 2016-11-12 MED ORDER — HEPARIN SOD (PORK) LOCK FLUSH 100 UNIT/ML IV SOLN
500.0000 [IU] | Freq: Once | INTRAVENOUS | Status: AC | PRN
  Administered 2016-11-12: 250 [IU] via INTRAVENOUS
  Filled 2016-11-12: qty 5

## 2016-11-15 ENCOUNTER — Other Ambulatory Visit (HOSPITAL_BASED_OUTPATIENT_CLINIC_OR_DEPARTMENT_OTHER): Payer: Medicaid Other

## 2016-11-15 ENCOUNTER — Ambulatory Visit: Payer: Medicaid Other

## 2016-11-15 ENCOUNTER — Ambulatory Visit (HOSPITAL_BASED_OUTPATIENT_CLINIC_OR_DEPARTMENT_OTHER): Payer: Medicaid Other | Admitting: Hematology

## 2016-11-15 ENCOUNTER — Ambulatory Visit (HOSPITAL_BASED_OUTPATIENT_CLINIC_OR_DEPARTMENT_OTHER): Payer: Medicaid Other

## 2016-11-15 VITALS — BP 138/86 | HR 114 | Temp 98.9°F | Resp 18 | Ht 69.5 in | Wt 272.7 lb

## 2016-11-15 DIAGNOSIS — E1140 Type 2 diabetes mellitus with diabetic neuropathy, unspecified: Secondary | ICD-10-CM | POA: Diagnosis not present

## 2016-11-15 DIAGNOSIS — C770 Secondary and unspecified malignant neoplasm of lymph nodes of head, face and neck: Secondary | ICD-10-CM

## 2016-11-15 DIAGNOSIS — E032 Hypothyroidism due to medicaments and other exogenous substances: Secondary | ICD-10-CM

## 2016-11-15 DIAGNOSIS — E279 Disorder of adrenal gland, unspecified: Secondary | ICD-10-CM

## 2016-11-15 DIAGNOSIS — G893 Neoplasm related pain (acute) (chronic): Secondary | ICD-10-CM | POA: Diagnosis not present

## 2016-11-15 DIAGNOSIS — D6481 Anemia due to antineoplastic chemotherapy: Secondary | ICD-10-CM

## 2016-11-15 DIAGNOSIS — G620 Drug-induced polyneuropathy: Secondary | ICD-10-CM

## 2016-11-15 DIAGNOSIS — Z794 Long term (current) use of insulin: Secondary | ICD-10-CM | POA: Diagnosis not present

## 2016-11-15 DIAGNOSIS — R42 Dizziness and giddiness: Secondary | ICD-10-CM

## 2016-11-15 DIAGNOSIS — I10 Essential (primary) hypertension: Secondary | ICD-10-CM

## 2016-11-15 DIAGNOSIS — Z5112 Encounter for antineoplastic immunotherapy: Secondary | ICD-10-CM

## 2016-11-15 DIAGNOSIS — C3491 Malignant neoplasm of unspecified part of right bronchus or lung: Secondary | ICD-10-CM

## 2016-11-15 DIAGNOSIS — C792 Secondary malignant neoplasm of skin: Secondary | ICD-10-CM | POA: Diagnosis not present

## 2016-11-15 DIAGNOSIS — C3492 Malignant neoplasm of unspecified part of left bronchus or lung: Secondary | ICD-10-CM

## 2016-11-15 DIAGNOSIS — Z79899 Other long term (current) drug therapy: Secondary | ICD-10-CM | POA: Diagnosis not present

## 2016-11-15 DIAGNOSIS — E119 Type 2 diabetes mellitus without complications: Secondary | ICD-10-CM

## 2016-11-15 DIAGNOSIS — Z452 Encounter for adjustment and management of vascular access device: Secondary | ICD-10-CM

## 2016-11-15 DIAGNOSIS — Z95828 Presence of other vascular implants and grafts: Secondary | ICD-10-CM

## 2016-11-15 DIAGNOSIS — C3490 Malignant neoplasm of unspecified part of unspecified bronchus or lung: Secondary | ICD-10-CM

## 2016-11-15 DIAGNOSIS — R51 Headache: Secondary | ICD-10-CM | POA: Diagnosis not present

## 2016-11-15 DIAGNOSIS — E039 Hypothyroidism, unspecified: Secondary | ICD-10-CM

## 2016-11-15 LAB — COMPREHENSIVE METABOLIC PANEL
ALBUMIN: 3.2 g/dL — AB (ref 3.5–5.0)
ALK PHOS: 85 U/L (ref 40–150)
ALT: 9 U/L (ref 0–55)
ANION GAP: 6 meq/L (ref 3–11)
AST: 14 U/L (ref 5–34)
BUN: 9.7 mg/dL (ref 7.0–26.0)
CALCIUM: 8.7 mg/dL (ref 8.4–10.4)
CHLORIDE: 102 meq/L (ref 98–109)
CO2: 31 mEq/L — ABNORMAL HIGH (ref 22–29)
Creatinine: 0.8 mg/dL (ref 0.6–1.1)
Glucose: 155 mg/dl — ABNORMAL HIGH (ref 70–140)
POTASSIUM: 3.8 meq/L (ref 3.5–5.1)
Sodium: 139 mEq/L (ref 136–145)
Total Bilirubin: 0.33 mg/dL (ref 0.20–1.20)
Total Protein: 7.4 g/dL (ref 6.4–8.3)

## 2016-11-15 LAB — CBC WITH DIFFERENTIAL/PLATELET
BASO%: 0.2 % (ref 0.0–2.0)
BASOS ABS: 0 10*3/uL (ref 0.0–0.1)
EOS ABS: 0.2 10*3/uL (ref 0.0–0.5)
EOS%: 4 % (ref 0.0–7.0)
HEMATOCRIT: 29.7 % — AB (ref 34.8–46.6)
HEMOGLOBIN: 9.7 g/dL — AB (ref 11.6–15.9)
LYMPH#: 1.2 10*3/uL (ref 0.9–3.3)
LYMPH%: 22.3 % (ref 14.0–49.7)
MCH: 29.1 pg (ref 25.1–34.0)
MCHC: 32.7 g/dL (ref 31.5–36.0)
MCV: 89.2 fL (ref 79.5–101.0)
MONO#: 0.3 10*3/uL (ref 0.1–0.9)
MONO%: 4.7 % (ref 0.0–14.0)
NEUT#: 3.8 10*3/uL (ref 1.5–6.5)
NEUT%: 68.8 % (ref 38.4–76.8)
Platelets: 267 10*3/uL (ref 145–400)
RBC: 3.33 10*6/uL — ABNORMAL LOW (ref 3.70–5.45)
RDW: 13 % (ref 11.2–14.5)
WBC: 5.5 10*3/uL (ref 3.9–10.3)

## 2016-11-15 LAB — TSH: TSH: 27.874 m[IU]/L — AB (ref 0.308–3.960)

## 2016-11-15 MED ORDER — SODIUM CHLORIDE 0.9 % IJ SOLN
10.0000 mL | INTRAMUSCULAR | Status: DC | PRN
  Administered 2016-11-15: 10 mL via INTRAVENOUS
  Filled 2016-11-15: qty 10

## 2016-11-15 MED ORDER — SODIUM CHLORIDE 0.9% FLUSH
10.0000 mL | INTRAVENOUS | Status: DC | PRN
  Administered 2016-11-15: 10 mL
  Filled 2016-11-15: qty 10

## 2016-11-15 MED ORDER — SODIUM CHLORIDE 0.9 % IV SOLN
Freq: Once | INTRAVENOUS | Status: AC
  Administered 2016-11-15: 14:00:00 via INTRAVENOUS

## 2016-11-15 MED ORDER — LEVOTHYROXINE SODIUM 125 MCG PO TABS: 125.0000 ug | ORAL_TABLET | Freq: Every day | ORAL | 2 refills | Status: DC

## 2016-11-15 MED ORDER — ACETAMINOPHEN 325 MG PO TABS
650.0000 mg | ORAL_TABLET | Freq: Once | ORAL | Status: AC
  Administered 2016-11-15: 650 mg via ORAL

## 2016-11-15 MED ORDER — ACETAMINOPHEN 325 MG PO TABS
ORAL_TABLET | ORAL | Status: AC
  Filled 2016-11-15: qty 2

## 2016-11-15 MED ORDER — DIPHENHYDRAMINE HCL 25 MG PO CAPS
ORAL_CAPSULE | ORAL | Status: AC
  Filled 2016-11-15: qty 1

## 2016-11-15 MED ORDER — SODIUM CHLORIDE 0.9 % IV SOLN
9.5000 mg/kg | Freq: Once | INTRAVENOUS | Status: AC
  Administered 2016-11-15: 1120 mg via INTRAVENOUS
  Filled 2016-11-15: qty 20

## 2016-11-15 MED ORDER — DIPHENHYDRAMINE HCL 25 MG PO TABS
25.0000 mg | ORAL_TABLET | Freq: Once | ORAL | Status: AC
  Administered 2016-11-15: 25 mg via ORAL
  Filled 2016-11-15: qty 1

## 2016-11-15 MED ORDER — HEPARIN SOD (PORK) LOCK FLUSH 100 UNIT/ML IV SOLN
250.0000 [IU] | Freq: Once | INTRAVENOUS | Status: AC | PRN
  Administered 2016-11-15: 250 [IU]
  Filled 2016-11-15: qty 5

## 2016-11-15 NOTE — Patient Instructions (Signed)
[place] Discharge Instructions for Patients Receiving Chemotherapy  Today you received the following chemotherapy agents Imfinzi  To help prevent nausea and vomiting after your treatment, we encourage you to take your nausea medication    If you develop nausea and vomiting that is not controlled by your nausea medication, call the clinic.   BELOW ARE SYMPTOMS THAT SHOULD BE REPORTED IMMEDIATELY:  *FEVER GREATER THAN 100.5 F  *CHILLS WITH OR WITHOUT FEVER  NAUSEA AND VOMITING THAT IS NOT CONTROLLED WITH YOUR NAUSEA MEDICATION  *UNUSUAL SHORTNESS OF BREATH  *UNUSUAL BRUISING OR BLEEDING  TENDERNESS IN MOUTH AND THROAT WITH OR WITHOUT PRESENCE OF ULCERS  *URINARY PROBLEMS  *BOWEL PROBLEMS  UNUSUAL RASH Items with * indicate a potential emergency and should be followed up as soon as possible.  Feel free to call the clinic you have any questions or concerns. The clinic phone number is [phone].  Please show the [place] at check-in to the Emergency Department and triage nurse.

## 2016-11-16 ENCOUNTER — Encounter: Payer: Self-pay | Admitting: Radiation Oncology

## 2016-11-16 LAB — T4, FREE: T4,Free(Direct): 0.22 ng/dL — ABNORMAL LOW (ref 0.82–1.77)

## 2016-11-16 NOTE — Progress Notes (Signed)
[name]    HEMATOLOGY/ONCOLOGY CLINIC NOTE  Date of Service: .11/15/2016  Patient Care Team: [name], FNP as PCP - General (Nurse Practitioner)  CHIEF COMPLAINTS/PURPOSE OF CONSULTATION:  f/u Lung Adenocarcinoma.  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  Current treatment: -Started Maintainence Durvalumab from  05/24/2016 q2weeks  PreviousTreatment -Concurrent Chemo-radiation with Carboplatin/Taxol. Completed RT on 02/13/2016 and has then completed 2 cycles of carboplatin + taxol.  HISTORY OF PRESENTING ILLNESS: Plz see my previous note for details on initial presentation.  INTERVAL HISTORY  Patient is here for her scheduled followup for prior to next dose of maintenance Durvalumab and to follow-up on her pathology results from ultrasound-guided biopsy of a right flank subcutaneous mass.Unfortunately the results of the biopsy showed that this mass is a metastatic lesion from her non-small cell lung cancer.  She also notes persistent and possibly some increased headaches and some intermittent dizziness. She notes that she is under a lot of stress and is the primary caregiver for her paralyzed son. She notes that he does not have any home help and is still pending Medicaid coverage. Her previous brain imaging was denied but she is now scheduled for MRI of the brain on 8/72/2018.  MEDICAL HISTORY:  Past Medical History:  Diagnosis Date  . Asthma   . Diabetes mellitus   . Hypercholesteremia   . Hypertension   . Lung cancer ([place])   . Neuropathy   . Obese     SURGICAL HISTORY: Past Surgical History:  Procedure Laterality Date  . CESAREAN SECTION    . IR GENERIC HISTORICAL  01/08/2016   IR US GUIDE VASC ACCESS RIGHT 01/08/2016 [name], DO [facility]  . IR GENERIC HISTORICAL  01/08/2016   IR FLUORO GUIDE CV LINE RIGHT 01/08/2016 [name], DO [facility]  . TUBAL LIGATION    . US guided core needle biopsy     of  right lower neck/supraclavicular lymph nodes    SOCIAL HISTORY: Social History   Social History  . Marital status: Divorced    Spouse name: N/A  . Number of children: N/A  . Years of education: N/A   Occupational History  . Not on file.   Social History Main Topics  . Smoking status: Never Smoker  . Smokeless tobacco: Never Used  . Alcohol use No  . Drug use: No  . Sexual activity: Not Currently   Other Topics Concern  . Not on file   Social History Narrative   No bird or mold exposure. No recent travel.    FAMILY HISTORY: Family History  Problem Relation Age of Onset  . Heart failure Mother   . Hypertension Mother   . Cancer Neg Hx   . Rheumatologic disease Neg Hx     ALLERGIES:  has No Known Allergies.  MEDICATIONS:  Current Outpatient Prescriptions  Medication Sig Dispense Refill  . aspirin EC 81 MG tablet Take 1 tablet (81 mg total) by mouth daily. 60 tablet 2  . benzonatate (TESSALON) 100 MG capsule Take 1 capsule (100 mg total) by mouth 3 (three) times daily as needed for cough. 60 capsule 0  . diltiazem (CARDIZEM CD) 180 MG 24 hr capsule Take 1 capsule (180 mg total) by mouth daily. 30 capsule 1  . DULoxetine (CYMBALTA) 60 MG capsule Take 1 capsule (60 mg total) by mouth daily. 30 capsule 2  . insulin aspart (NOVOLOG) 100 UNIT/ML FlexPen As per sliding scale instructions (Patient taking differently: Inject  0-20 Units into the skin 3 (three) times daily as needed for high blood sugar. As per sliding scale instructions) 15 mL 2  . Insulin Degludec (TRESIBA FLEXTOUCH) 200 UNIT/ML SOPN Inject 100 Units into the skin daily before breakfast.     . levothyroxine (SYNTHROID, LEVOTHROID) 125 MCG tablet Take 1 tablet (125 mcg total) by mouth daily before breakfast. 30 tablet 2  . oxyCODONE-acetaminophen (PERCOCET/ROXICET) 5-325 MG tablet Take 1-2 tablets by mouth every 6 (six) hours as needed for moderate pain or severe pain. 40 tablet 0  . polyethylene glycol  (MIRALAX) packet Take 17 g by mouth daily. 30 each 1  . senna-docusate (SENNA S) 8.6-50 MG tablet Take 2 tablets by mouth 2 (two) times daily. May reduce to 2 tab po HS as needed once regular BM estabished 60 tablet 1   No current facility-administered medications for this visit.    Facility-Administered Medications Ordered in Other Visits  Medication Dose Route Frequency Provider Last Rate Last Dose  . sodium chloride 0.9 % injection 10 mL  10 mL Intravenous PRN [name], MD   10 mL at 05/24/16 1252    REVIEW OF SYSTEMS:    10 Point review of Systems was done is negative except as noted above.  PHYSICAL EXAMINATION: ECOG PERFORMANCE STATUS: 1 - Symptomatic but completely ambulatory  . Vitals:   11/15/16 1204  BP: 138/86  Pulse: (!) 114  Resp: 18  Temp: 98.9 F (37.2 C)  SpO2: 98%   Filed Weights   11/15/16 1204  Weight: 272 lb 11.2 oz (123.7 kg)   .Body mass index is 39.69 kg/m.  GENERAL:alert, in no acute distress and comfortable SKIN: radiation related skin injury over neck - healing EYES: normal, conjunctiva are pink and non-injected, sclera clear OROPHARYNX:oral thrush noted NECK: supple, no JVD, thyroid normal size, non-tender, without nodularity LYMPH:  no palpable lymphadenopathy in the cervical, axillary or inguinal.             LUNGS: clear to auscultation with normal respiratory effort. Distant breath sounds  HEART: regular rate & rhythm,  no murmurs and no lower extremity edema ABDOMEN: abdomen obese, soft, non-tender, normoactive bowel sounds  Musculoskeletal: no cyanosis of digits and no clubbing  PSYCH: alert & oriented x 3 with fluent speech NEURO: no focal motor/sensory deficits  LABORATORY DATA: .  [name] CBC Latest Ref Rng & Units 11/15/2016 11/10/2016 11/01/2016  WBC 3.9 - 10.3 10e3/uL 5.5 8.0 6.5  Hemoglobin 11.6 - 15.9 g/dL 9.7(L) 10.5(L) 9.9(L)  Hematocrit 34.8 - 46.6 % 29.7(L) 31.6(L) 30.7(L)  Platelets 145 - 400 10e3/uL 267 317 285    . CMP Latest Ref Rng & Units 11/15/2016 11/01/2016 10/29/2016  Glucose 70 - 140 mg/dl 155(H) 242(H) 154(H)  BUN 7.0 - 26.0 mg/dL 9.7 9.8 9  Creatinine 0.6 - 1.1 mg/dL 0.8 0.9 0.68  Sodium 136 - 145 mEq/L 139 136 140  Potassium 3.5 - 5.1 mEq/L 3.8 3.8 3.5  Chloride 101 - 111 mmol/L - - 103  CO2 22 - 29 mEq/L 31(H) 28 29  Calcium 8.4 - 10.4 mg/dL 8.7 8.7 8.4(L)  Total Protein 6.4 - 8.3 g/dL 7.4 7.2 -  Total Bilirubin 0.20 - 1.20 mg/dL 0.33 0.26 -  Alkaline Phos 40 - 150 U/L 85 81 -  AST 5 - 34 U/L 14 15 -  ALT 0 - 55 U/L 9 9 -   Component     Latest Ref Rng & Units 11/15/2016  TSH  0.308 - 3.960 m(IU)/L 27.874 (H)  T4,Free(Direct)     0.82 - 1.77 ng/dL 0.22 (L)               RADIOGRAPHIC STUDIES:  .Dg Chest 2 View  Result Date: 10/29/2016 CLINICAL DATA:  Shortness of breath and chest pain EXAM: CHEST  2 VIEW COMPARISON:  09/13/2016 FINDINGS: Postradiation changes are again seen in the right perihilar and suprahilar region. Right-sided PICC line is again noted at the cavoatrial junction. No focal infiltrate or sizable effusion is seen. No acute bony abnormality is noted. IMPRESSION: Chronic changes without acute abnormality. Electronically Signed   By: Inez Catalina M.D.   On: 10/29/2016 13:42   Ct Angio Chest Pe W/cm &/or Wo Cm  Result Date: 10/29/2016 CLINICAL DATA:  [place].  History of lung cancer.  Right flank mass. EXAM: CT ANGIOGRAPHY CHEST CT ABDOMEN AND PELVIS WITH CONTRAST TECHNIQUE: Multidetector CT imaging of the chest was performed using the standard protocol during bolus administration of intravenous contrast. Multiplanar CT image reconstructions and MIPs were obtained to evaluate the vascular anatomy. Multidetector CT imaging of the abdomen and pelvis was performed using the standard protocol during bolus administration of intravenous contrast. CONTRAST:  100 cc Isovue 370 COMPARISON:  Chest radiographs obtained today. Chest CTA dated 07/05/2016. Chest CT  dated 09/13/2016. Abdomen and pelvis CT dated 12/27/2015. FINDINGS: CTA CHEST FINDINGS Cardiovascular: Normally opacified pulmonary arteries with no pulmonary arterial filling defects seen. Small pericardial effusion with a maximum thickness of 8 mm. Mediastinum/Nodes: Soft tissue density in the precarinal mediastinum on the right, previously measuring 3.8 cm in maximum diameter, currently measures 2.9 cm and maximum diameter. The previously demonstrated 10 mm short axis subcarinal node continues to have a 10 mm short axis diameter on image number 43 of series 4. No enlarged lymph nodes elsewhere in the mediastinum. Unremarkable included thyroid gland. Lungs/Pleura: Postradiation changes in the medial aspect of the right lung are stable. Clear left lung. Musculoskeletal: Thoracic spine degenerative changes. Review of the MIP images confirms the above findings. CT ABDOMEN and PELVIS FINDINGS Hepatobiliary: Multiple large, minimally calcified gallstones in the gallbladder. The largest measures 1.6 cm in maximum diameter. No gallbladder wall thickening or pericholecystic fluid. Unremarkable liver. Pancreas: Unremarkable. No pancreatic ductal dilatation or surrounding inflammatory changes. Spleen: Normal in size without focal abnormality. Adrenals/Urinary Tract: Adrenal glands are unremarkable. Kidneys are normal, without renal calculi, focal lesion, or hydronephrosis. Bladder is unremarkable. Stomach/Bowel: Unremarkable stomach, small bowel and colon. No evidence of appendicitis. Vascular/Lymphatic: No significant vascular findings are present. No enlarged abdominal or pelvic lymph nodes. Reproductive: Multiple uterine masses, measuring up to 4.1 cm in maximum diameter each. Normal appearing ovaries. Other: Lobulated, oval, heterogeneous enhancing mass in the right lateral subcutaneous fat at the level of the mid abdomen. This mass measures 5.0 x 4.7 cm in maximum dimensions on image number 48 of series 5. This  measures 5.0 cm in length on coronal image number 91. Very small umbilical hernia containing fat. Musculoskeletal: Minimal lumbar spine degenerative changes. No evidence of bony metastatic disease. Review of the MIP images confirms the above findings. IMPRESSION: 1. No pulmonary emboli. 2. No acute chest, abdomen or pelvic abnormality. 3. Interval 5.0 x 5.0 x 4.7 cm mass in the right lateral subcutaneous fat at the level of the mid abdomen. This has CT features compatible with a metastasis or primary neoplasm. This would be amenable to ultrasound-guided core needle biopsy. 4. Interval decrease in size of the previously demonstrated precarinal mediastinal  mass. 5. Stable mildly enlarged subcarinal lymph node. 6. Stable right lung postradiation changes. 7. Cholelithiasis without evidence of cholecystitis. 8. Small pericardial effusion with a maximum thickness of 8 mm. Electronically Signed   By: Claudie Revering M.D.   On: 10/29/2016 15:52   Ct Abdomen Pelvis W Contrast  Result Date: 10/29/2016 CLINICAL DATA:  [place].  History of lung cancer.  Right flank mass. EXAM: CT ANGIOGRAPHY CHEST CT ABDOMEN AND PELVIS WITH CONTRAST TECHNIQUE: Multidetector CT imaging of the chest was performed using the standard protocol during bolus administration of intravenous contrast. Multiplanar CT image reconstructions and MIPs were obtained to evaluate the vascular anatomy. Multidetector CT imaging of the abdomen and pelvis was performed using the standard protocol during bolus administration of intravenous contrast. CONTRAST:  100 cc Isovue 370 COMPARISON:  Chest radiographs obtained today. Chest CTA dated 07/05/2016. Chest CT dated 09/13/2016. Abdomen and pelvis CT dated 12/27/2015. FINDINGS: CTA CHEST FINDINGS Cardiovascular: Normally opacified pulmonary arteries with no pulmonary arterial filling defects seen. Small pericardial effusion with a maximum thickness of 8 mm. Mediastinum/Nodes: Soft tissue density in the precarinal  mediastinum on the right, previously measuring 3.8 cm in maximum diameter, currently measures 2.9 cm and maximum diameter. The previously demonstrated 10 mm short axis subcarinal node continues to have a 10 mm short axis diameter on image number 43 of series 4. No enlarged lymph nodes elsewhere in the mediastinum. Unremarkable included thyroid gland. Lungs/Pleura: Postradiation changes in the medial aspect of the right lung are stable. Clear left lung. Musculoskeletal: Thoracic spine degenerative changes. Review of the MIP images confirms the above findings. CT ABDOMEN and PELVIS FINDINGS Hepatobiliary: Multiple large, minimally calcified gallstones in the gallbladder. The largest measures 1.6 cm in maximum diameter. No gallbladder wall thickening or pericholecystic fluid. Unremarkable liver. Pancreas: Unremarkable. No pancreatic ductal dilatation or surrounding inflammatory changes. Spleen: Normal in size without focal abnormality. Adrenals/Urinary Tract: Adrenal glands are unremarkable. Kidneys are normal, without renal calculi, focal lesion, or hydronephrosis. Bladder is unremarkable. Stomach/Bowel: Unremarkable stomach, small bowel and colon. No evidence of appendicitis. Vascular/Lymphatic: No significant vascular findings are present. No enlarged abdominal or pelvic lymph nodes. Reproductive: Multiple uterine masses, measuring up to 4.1 cm in maximum diameter each. Normal appearing ovaries. Other: Lobulated, oval, heterogeneous enhancing mass in the right lateral subcutaneous fat at the level of the mid abdomen. This mass measures 5.0 x 4.7 cm in maximum dimensions on image number 48 of series 5. This measures 5.0 cm in length on coronal image number 91. Very small umbilical hernia containing fat. Musculoskeletal: Minimal lumbar spine degenerative changes. No evidence of bony metastatic disease. Review of the MIP images confirms the above findings. IMPRESSION: 1. No pulmonary emboli. 2. No acute chest, abdomen  or pelvic abnormality. 3. Interval 5.0 x 5.0 x 4.7 cm mass in the right lateral subcutaneous fat at the level of the mid abdomen. This has CT features compatible with a metastasis or primary neoplasm. This would be amenable to ultrasound-guided core needle biopsy. 4. Interval decrease in size of the previously demonstrated precarinal mediastinal mass. 5. Stable mildly enlarged subcarinal lymph node. 6. Stable right lung postradiation changes. 7. Cholelithiasis without evidence of cholecystitis. 8. Small pericardial effusion with a maximum thickness of 8 mm. Electronically Signed   By: Claudie Revering M.D.   On: 10/29/2016 15:52   US Biopsy  Result Date: 11/10/2016 INDICATION: 51 year old female with a history of right flank mass concerning for metastatic lung carcinoma EXAM: ULTRASOUND-GUIDED BIOPSY RIGHT FLANK MASS MEDICATIONS:  None. ANESTHESIA/SEDATION: Moderate (conscious) sedation was employed during this procedure. A total of Versed 1.0 mg and Fentanyl 50 mcg was administered intravenously. Moderate Sedation Time: 10 minutes. The patient's level of consciousness and vital signs were monitored continuously by radiology nursing throughout the procedure under my direct supervision. FLUOROSCOPY TIME:  Ultrasound COMPLICATIONS: None PROCEDURE: Informed written consent was obtained from the patient after a thorough discussion of the procedural risks, benefits and alternatives. All questions were addressed. Maximal Sterile Barrier Technique was utilized including caps, mask, sterile gowns, sterile gloves, sterile drape, hand hygiene and skin antiseptic. A timeout was performed prior to the initiation of the procedure. Patient positioned supine position on the ultrasound gantry. Images of the right flank were performed with images stored and sent to PACs. The patient was then prepped and draped in the usual sterile fashion. The skin and subcutaneous tissues were generously infiltrated 1% lidocaine for local anesthesia.  A small stab incision was made with 11 blade scalpel. Using ultrasound guidance, multiple 18 gauge core biopsy were acquired and placed into formalin solution. Final image was stored and a sterile bandage was placed. Patient tolerated the procedure well and remained hemodynamically stable throughout. No complications were encountered and no significant blood loss. IMPRESSION: Status post ultrasound-guided biopsy of right-sided flank mass with multiple 18 gauge core biopsy. Tissue specimen sent to pathology for complete histopathologic analysis. Signed, Dulcy Fanny. Earleen Newport, DO [facility] Electronically Signed   By: [name] D.O.   On: 11/10/2016 13:39     ASSESSMENT & PLAN:   51 year old African-American female with history of hypertension, diabetes, asthma, dysuria mass and morbid obesity with   #1 Lung Adenocarcinoma Rt sided atleast Stage IIIB (on diagnosis) with large right paratracheal mass with mediastinal adenopathy that appears to have grown significantly over the last 6-7 months and rt supraclavicular LN +.   Noted to have a small mass in the left adrenal on CT but PET/CT neg for metastatic disease. Patient has been a lifelong nonsmoker  MRI of the brain was negative for any metastatic disease. At diagnosis.  High PDL1 expression (90%) on foundation One Neg for EGFR, ALK, ROS-1 and BRAF mutations.  Patient completed her planned definitive chemoradiation with carbo Taxol on 02/13/2016. No prohibitive toxicities other than some grade 1 skin desquamation and some grade 1-2 radiation esophagitis. She has subsequently received 2 out of 2 planned dose of carboplatin + Taxol. Patient has completed 11 doses of maintenance  Durvalumab will receive the 12th dose today  CTA chest 10/29/2016  no evidence of lung cancer progression in the chest. No PE CT abd/pelvis-10/29/2016  Interval 5.0 x 5.0 x 4.7 cm mass in the right lateral  subcutaneous fat at the level of the mid abdomen. This has CT features compatible with a metastasis or primary neoplasm.  Biopsy of this right lateral subcutaneous mass is consistent with metastatic small cell adenocarcinoma. Plan -Patient has isolated progression with isolated metastases in the right lateral subcutaneous fat at the level of the midabdomen. This metastasis is painful. -Will refer to radiation oncology for consideration of palliative radiation to this mass. -MRI of the brain with and without contrast scheduled for 11/19/2016 to evaluate her headache and dizziness and to rule out brain metastases. -Will continue proceed with her next treatment with Durvalumab today - Continue when necessary Percocet for pain management  -Changes in systemic therapy based on results of brain MRI. A brain MRI is negative for metastases and patient only  has an isolated subcutaneous metastasis that can be palliatively radiated - would consider continued immunotherapy with a possible switch to Nivolumab/Pembrolizumab.  #2 h/o radiation pneumonitis . #3 Improving fatigue (grade 1) -working with PT once weekly and encourage to walk as much as possible and optimize DM2 management and Mx of newly diagnosed hypothyroidism. TSH better but still elevated with low Free t4 levels #4 normocytic anemia related to chemotherapy -resolving. hgb has improved to 11 #5 Left chest wall pain due to costochondritis - mx with pain medications. CTA x 2 neg for PE Plan -DM2 better controlled -We shall increase her levothyroxine dose to 125 g per day given her symptoms and elevated TSH levels on last check. -Patient was instructed on taking her levothyroxine first thing in the morning without any other medications and cannot eat anything with it or after total at least one hour. -Recheck thyroid function tests in 3-4 weeks and adjust levothyroxine dose accordingly .  #6 Sinus tachycardia - on Cardizem CR , no PE on CTA x  2 Plan -Still having sinus tachycardia . Partly from deconditioning and anemia. Seeing Dr. Debara Pickett from cardiology  -continue Cardizem CR 151m po daily -maintain good po hydration.  #7 neoplasm related pain is currently controlled without significant medications. Clinically likely has sleep apnea . Would need to be careful with sedative medications . Plan -recommend sleep study with PCP  #8 hypertension  diabetes -uncontrolled  Dyslipidemia  Asthma Plan - insulin SS for uncontrolled hyperglycemia in the setting of steroid use with chemotherapy. 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 No focal motor deficits. Given concern for metastatic lesion and abdominal wall would also rule out metastatic disease to the brain in the setting of her headaches. -MRI Brain w and wo contrast ASAP.  MRI brain w and Wo contrast ASAP within 3-4 days. Schedule for next treatment of Durvalumab in 2 weeks -- will adjust plan based on MRI brain findings Radiation Oncology referral for rt flank metastatic mass to consider palliative radiation (MLedon SnareRTC with Dr KaIrene Limbon 2 weeks with labs   All of the patients questions were answered with apparent satisfaction. The patient knows to call the clinic with any problems, questions or concerns.  I spent 30 minutes counseling the patient face to face. The total time spent in the appointment was 40 minutes and more than 50% was on counseling and direct patient cares.    GaSullivan LoneD MSWintersvilleAHIVMS SCMercy Hospital FairfieldTEye And Laser Surgery Centers Of New Jersey LLCematology/Oncology Physician CoMemorial [facility](Office):       [phone] cell):  [phone]):           [phone]

## 2016-11-17 NOTE — Progress Notes (Signed)
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 - SEE COMMENT. Microscopic Comment The malignant cells are positive for cytokeratin 7, TTF-1, and p63. They are negative for cytokeratin 5/6, Napsin, estrogen receptor, GATA-3, and CD10. There is faint staining for PAX-8. This immunohistochemical profile is non-specific; however, the combination of positive TTF-1 and cytokeratin 7 staining is suggestive of adenocarcinoma. Dr. Irene Limbo was paged on 11/12/2016. Additional studies can be performed upon clinician request. (JBK:ah 11/12/16)   Dr. Sullivan Lone Maintainence Durvalumab from  05/24/2016 q2weeks  Past skin cancers, if any:  1) Location/Histology/Intervention: No  2) Location/Histology/Intervention: No  3) Location/Histology/Intervention: No  History of Blistering sunburns, if any: No  SAFETY ISSUES:  Prior radiation? 01-05-16-02-13-16 Left lung  Pacemaker/ICD? : No  Possible current pregnancy? : No  Is the patient on methotrexate? 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                     Receiving maintainence Durvalumab  started 05/24/2016 q2 weeks per Dr. Irene Limbo.  Reports pain to right abdomen 6/10 taking Oxycodone with some relief.                     Wt Readings from Last 3 Encounters:  11/24/16 275 lb (124.7 kg)  11/15/16 272 lb 11.2 oz  (123.7 kg)  11/10/16 267 lb 3.2 oz (121.2 kg)  BP (!) 147/99   Pulse (!) 123   Temp 98.7 F (37.1 C) (Oral)   Resp 18   Ht 5' 9.5" (1.765 m)   Wt 275 lb (124.7 kg)   SpO2 100%   BMI 40.03 kg/m

## 2016-11-19 ENCOUNTER — Ambulatory Visit (HOSPITAL_BASED_OUTPATIENT_CLINIC_OR_DEPARTMENT_OTHER): Payer: Medicaid Other

## 2016-11-19 ENCOUNTER — Ambulatory Visit (HOSPITAL_COMMUNITY)
Admission: RE | Admit: 2016-11-19 | Discharge: 2016-11-19 | Disposition: A | Discharge status: Home / Self Care | Payer: Medicaid Other | Source: Ambulatory Visit | Attending: Hematology | Admitting: Hematology

## 2016-11-19 DIAGNOSIS — R51 Headache: Secondary | ICD-10-CM | POA: Insufficient documentation

## 2016-11-19 DIAGNOSIS — C792 Secondary malignant neoplasm of skin: Secondary | ICD-10-CM

## 2016-11-19 DIAGNOSIS — Z95828 Presence of other vascular implants and grafts: Secondary | ICD-10-CM

## 2016-11-19 DIAGNOSIS — C3491 Malignant neoplasm of unspecified part of right bronchus or lung: Secondary | ICD-10-CM

## 2016-11-19 DIAGNOSIS — Z452 Encounter for adjustment and management of vascular access device: Secondary | ICD-10-CM | POA: Diagnosis not present

## 2016-11-19 DIAGNOSIS — C770 Secondary and unspecified malignant neoplasm of lymph nodes of head, face and neck: Secondary | ICD-10-CM | POA: Diagnosis not present

## 2016-11-19 DIAGNOSIS — R519 Headache, unspecified: Secondary | ICD-10-CM

## 2016-11-19 MED ORDER — HEPARIN SOD (PORK) LOCK FLUSH 100 UNIT/ML IV SOLN
250.0000 [IU] | Freq: Once | INTRAVENOUS | Status: AC
  Administered 2016-11-19: 250 [IU]
  Filled 2016-11-19: qty 5

## 2016-11-19 MED ORDER — SODIUM CHLORIDE 0.9% FLUSH
10.0000 mL | Freq: Once | INTRAVENOUS | Status: AC
  Administered 2016-11-19: 10 mL
  Filled 2016-11-19: qty 10

## 2016-11-19 MED ORDER — GADOBENATE DIMEGLUMINE 529 MG/ML IV SOLN
20.0000 mL | Freq: Once | INTRAVENOUS | Status: AC | PRN
  Administered 2016-11-19: 20 mL via INTRAVENOUS

## 2016-11-21 IMAGING — DX DG CHEST 2V
2 series · 2 of 2 positions shown · non-contrast
Comparison: 09/15/2015

CLINICAL DATA: Generalize chronic chest pain, shortness of breath,
and cough. History of asthma, diabetes, and hypertension.

EXAM:
CHEST  2 VIEW

[w chest pa]
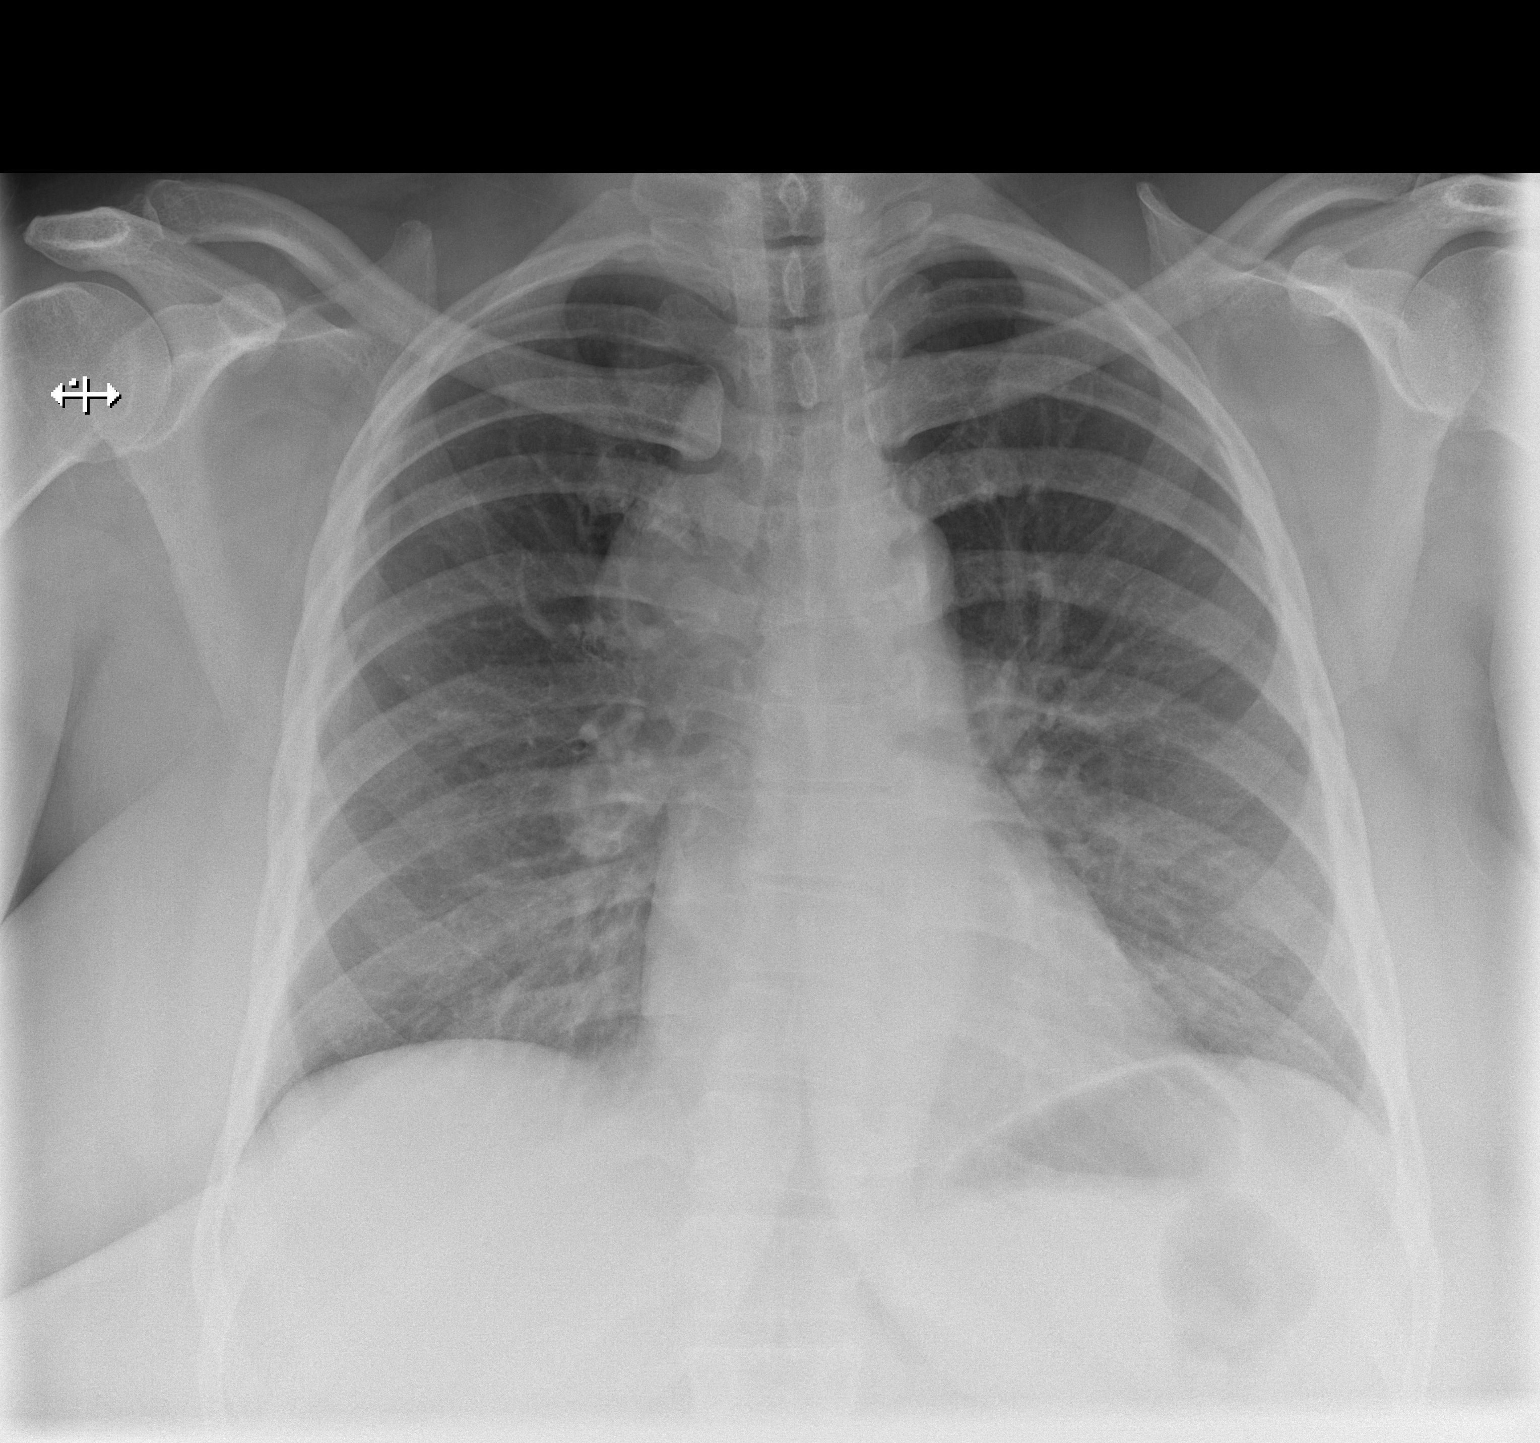

[w chest lat]
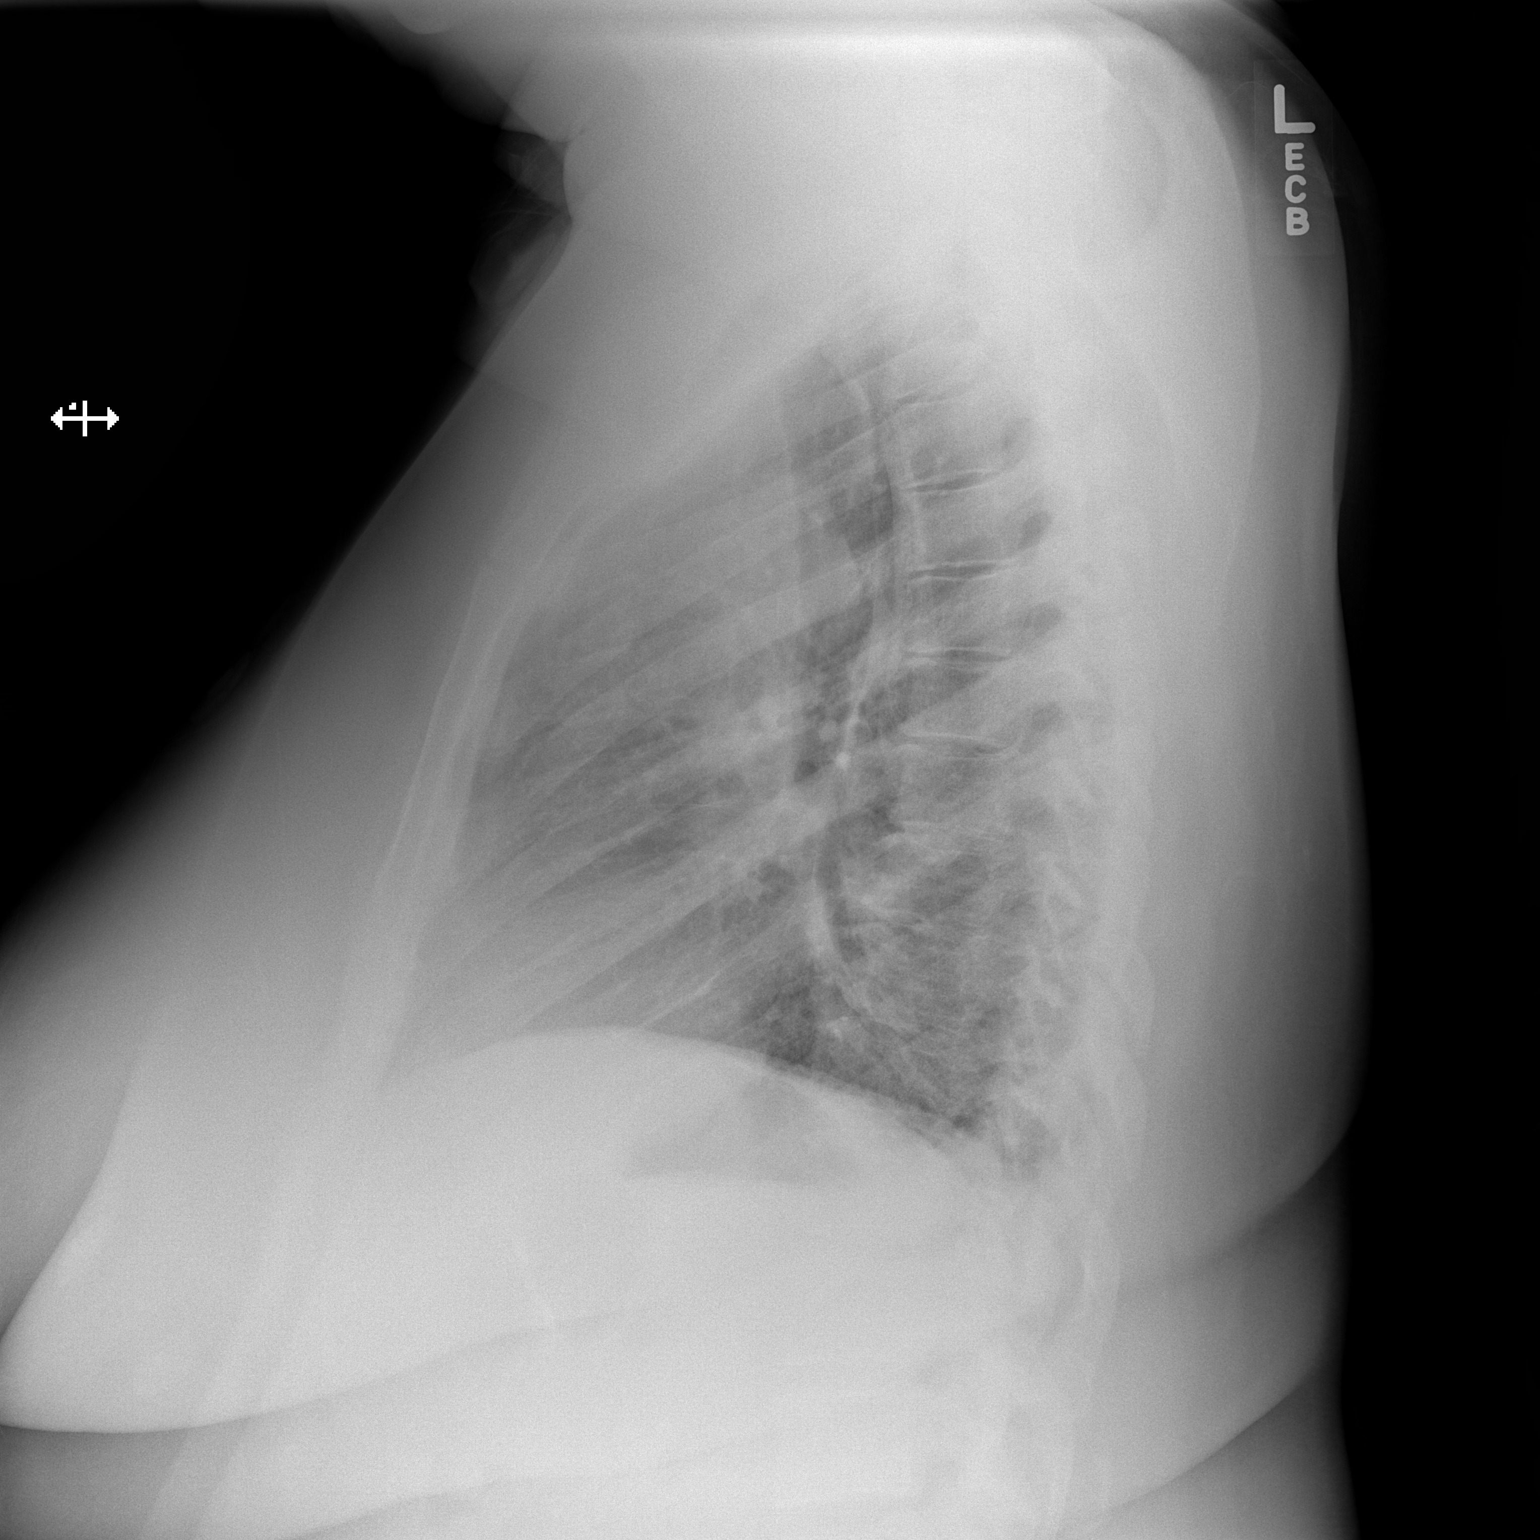

[2 of 2 positions shown; findings below may reference images not displayed]

FINDINGS: Right hilar and right peritracheal lymphadenopathy is demonstrated
similar to prior study. Lungs are clear and expanded. No focal
airspace disease or consolidation. No blunting of costophrenic
angles. No pneumothorax. Normal heart size and pulmonary
vascularity. Mild degenerative changes in the spine.
IMPRESSION: Right hilar and right paratracheal lymphadenopathy is demonstrated,
similar to prior study. No evidence of active pulmonary disease.

## 2016-11-22 ENCOUNTER — Ambulatory Visit (HOSPITAL_BASED_OUTPATIENT_CLINIC_OR_DEPARTMENT_OTHER): Payer: Medicaid Other

## 2016-11-22 DIAGNOSIS — C3491 Malignant neoplasm of unspecified part of right bronchus or lung: Secondary | ICD-10-CM | POA: Diagnosis not present

## 2016-11-22 DIAGNOSIS — Z452 Encounter for adjustment and management of vascular access device: Secondary | ICD-10-CM | POA: Diagnosis present

## 2016-11-22 DIAGNOSIS — C770 Secondary and unspecified malignant neoplasm of lymph nodes of head, face and neck: Secondary | ICD-10-CM | POA: Diagnosis not present

## 2016-11-22 DIAGNOSIS — C792 Secondary malignant neoplasm of skin: Secondary | ICD-10-CM | POA: Diagnosis not present

## 2016-11-22 IMAGING — MR MR HEAD WO/W CM
10 of 13 series · 32 of 48 positions shown · IV contrast (multihance)
Comparison: CT 05/30/2015

CLINICAL DATA: Lung cancer. Headache. Assess for metastatic
disease.

EXAM:
MRI HEAD WITHOUT AND WITH CONTRAST
TECHNIQUE: Multiplanar, multiecho pulse sequences of the brain and surrounding
structures were obtained without and with intravenous contrast.
CONTRAST:  20mL MULTIHANCE GADOBENATE DIMEGLUMINE 529 MG/ML IV SOLN

[Series 2: FLAIR · sagittal · 5.0mm · 0.47mm/px · 2 of 23 slices shown (1 of 2)]
[im 1/23]
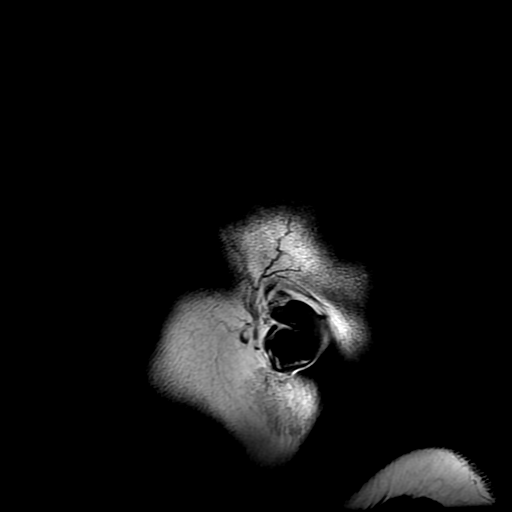
[im 23/23]
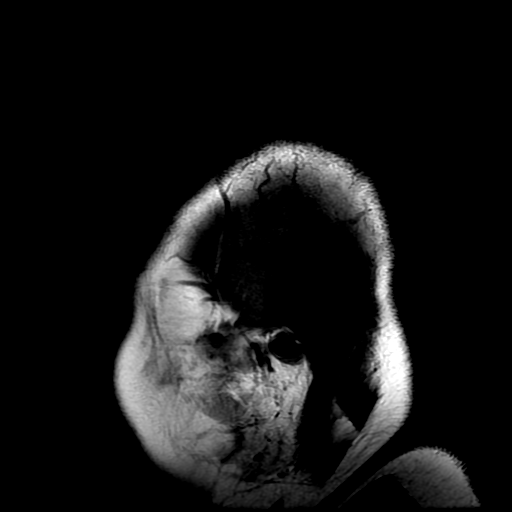

[Series 4: DWI · axial · 3.0mm · 0.94mm/px · z∈[-119,+14]mm · 7 of 92 slices shown (1 of 2)]
[im 1/92]
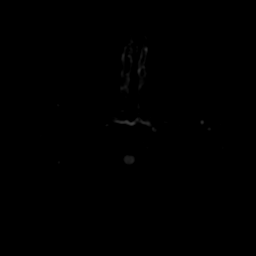
[im 16/92]
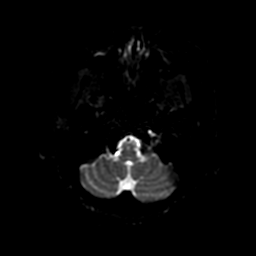
[im 31/92]
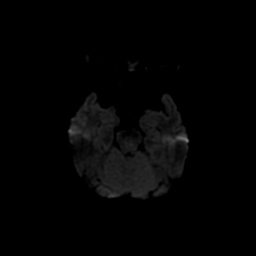
[im 46/92]
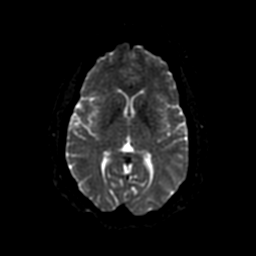
[im 61/92]
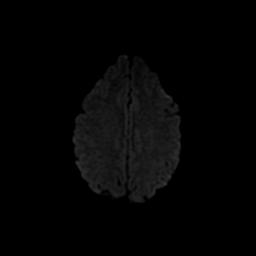
[im 76/92]
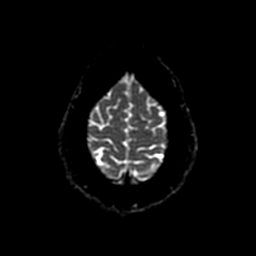
[im 92/92]
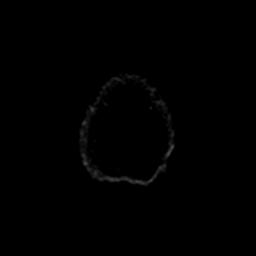

[Series 5: T2 · axial · 5.0mm · 0.47mm/px · z∈[-118,+13]mm · 2 of 23 slices shown]
[im 1/23]
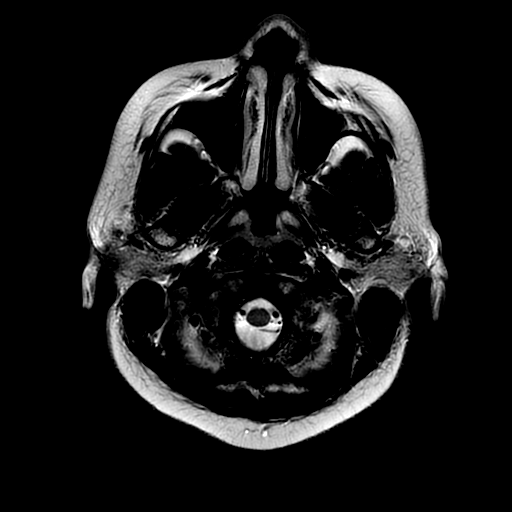
[im 23/23]
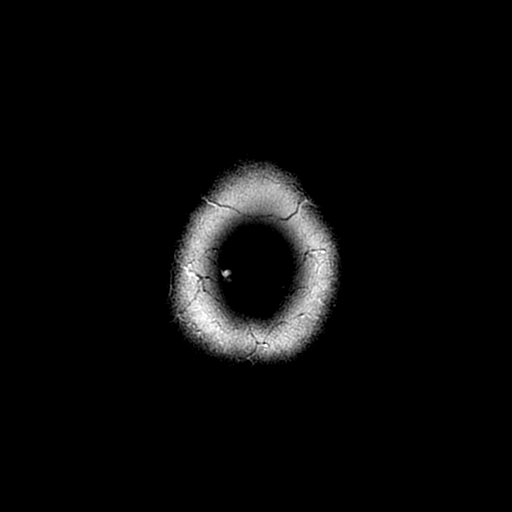

[Series 6: DWI · coronal · 4.0mm · 0.94mm/px · 5 of 68 slices shown (2 of 2)]
[im 1/68]
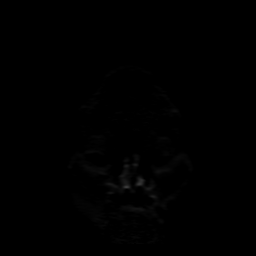
[im 17/68]
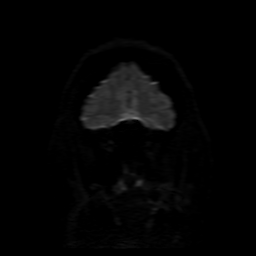
[im 34/68]
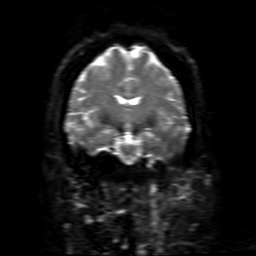
[im 51/68]
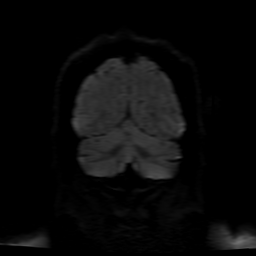
[im 68/68]
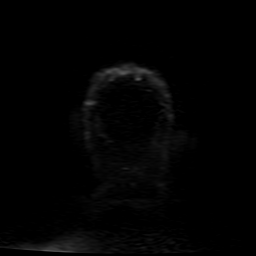

[Series 7: (person_name) · axial · 3.0mm · 0.47mm/px · z∈[-119,-52]mm · 4 of 92 slices shown]
[im 1/92]
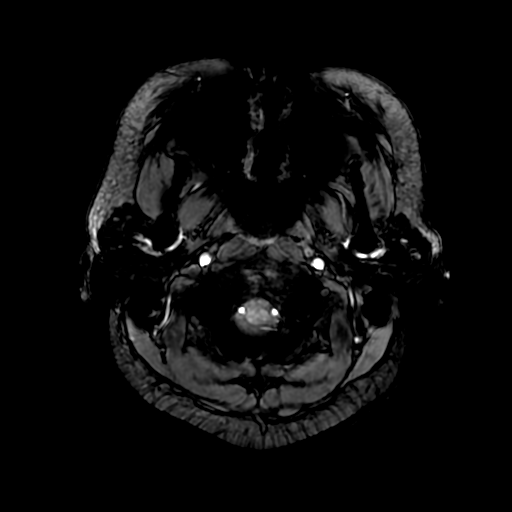
[im 16/92]
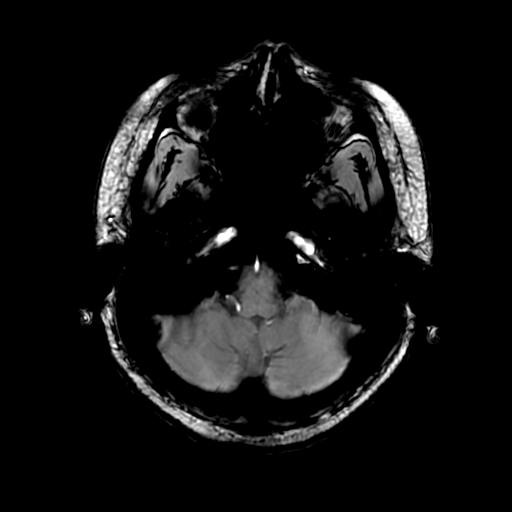
[im 31/92]
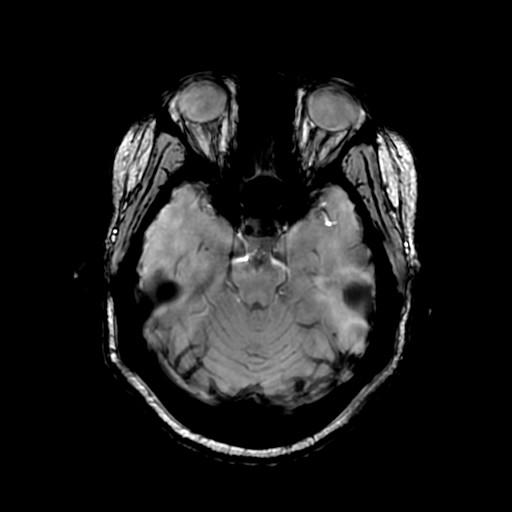
[im 46/92]
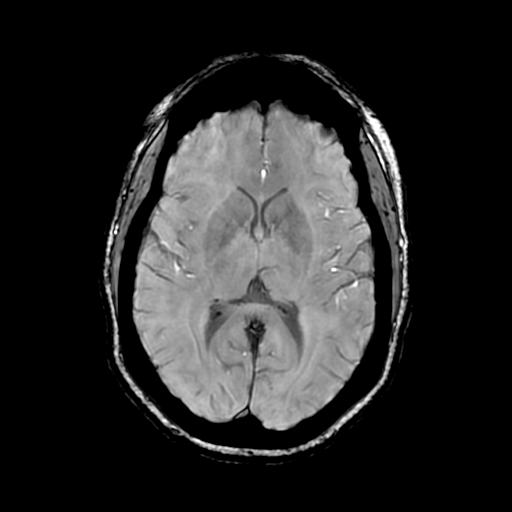

[Series 9: T2 post-contrast · coronal · 5.0mm · 0.39mm/px · 2 of 28 slices shown]
[im 1/28]
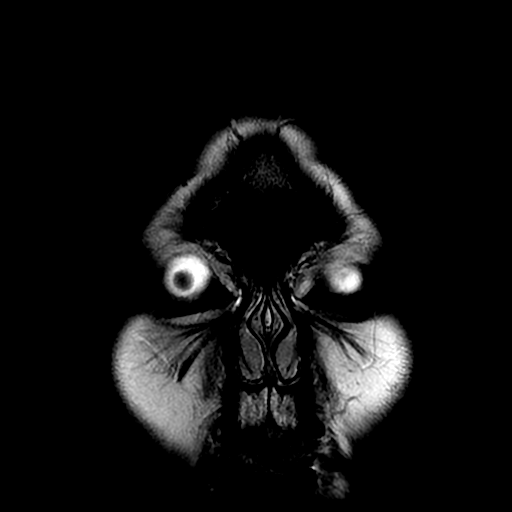
[im 28/28]
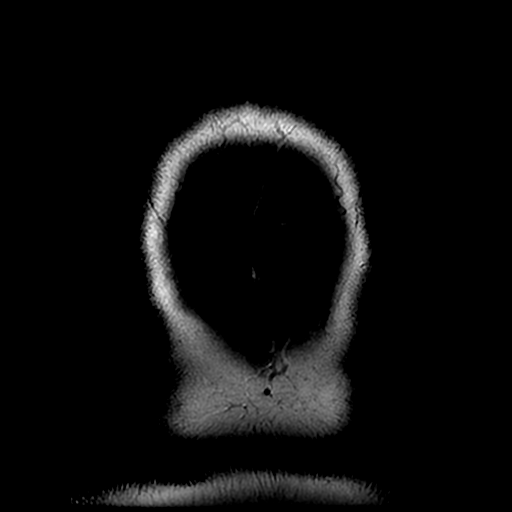

[Series 11: T1 · coronal · 5.0mm · 0.39mm/px · 2 of 28 slices shown]
[im 1/28]
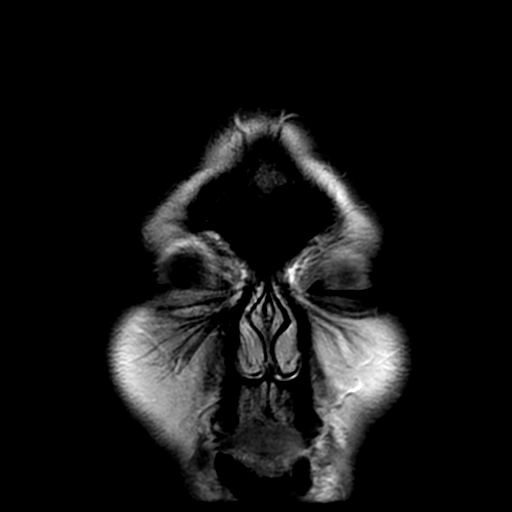
[im 28/28]
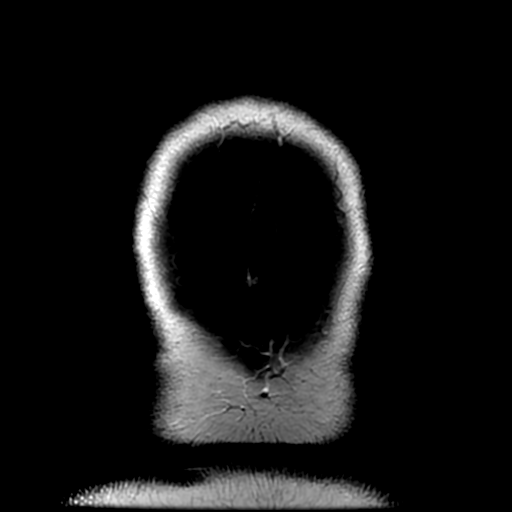

[Series 12: FLAIR · sagittal · 5.0mm · 0.47mm/px · 2 of 23 slices shown (2 of 2)]
[im 1/23]
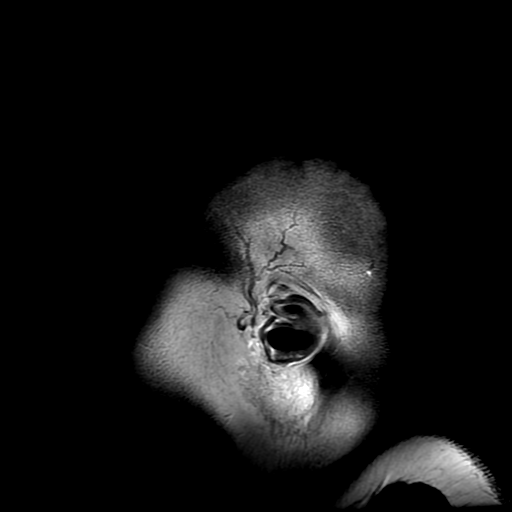
[im 23/23]
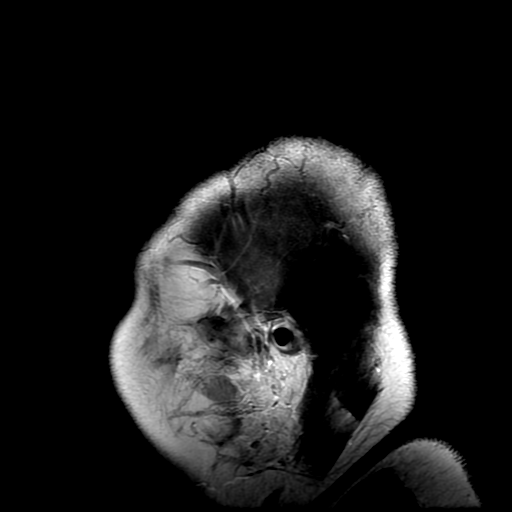

[Series 450: ADC · axial · 3.0mm · 0.94mm/px · z∈[-119,+14]mm · 3 of 46 slices shown (1 of 2)]
[im 1/46]
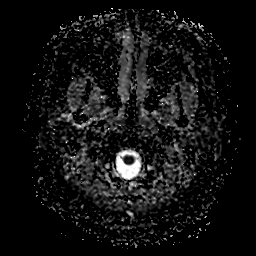
[im 23/46]
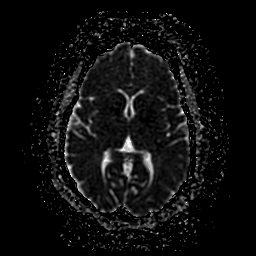
[im 46/46]
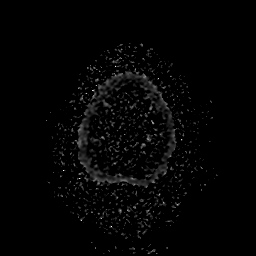

[Series 650: ADC · coronal · 4.0mm · 0.94mm/px · 3 of 34 slices shown (2 of 2)]
[im 1/34]
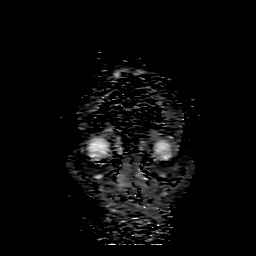
[im 17/34]
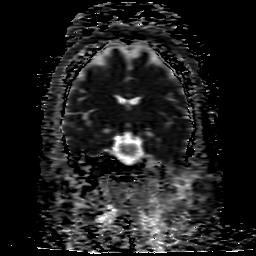
[im 34/34]
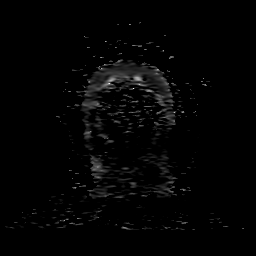

[32 of 48 positions shown; findings below may reference images not displayed]

FINDINGS: Brain: The brain has normal appearance without evidence of
malformation, atrophy, old or acute small or large vessel
infarction, hemorrhage, hydrocephalus or extra-axial collection. No
pituitary abnormality. After contrast administration, no abnormal
enhancement occurs.

Vascular: Major vessels at the base of the brain show flow.

Skull and upper cervical spine: Normal

Sinuses/Orbits: Clear/ normal.

Other: None significant.
IMPRESSION: Normal examination. No evidence of metastatic disease. No other
finding to explain headache.

## 2016-11-22 IMAGING — CT CT ANGIO CHEST
1 of 11 series · 16 of 36 positions shown · IV contrast (Iohexol (Omnipaque 350))
Comparison: Chest radiograph performed 12/25/2015, and CTA of the
chest performed 05/30/2015

CLINICAL DATA: Acute onset of generalized chest pain, tachycardia,
high blood pressure and shortness of breath. Initial encounter.

EXAM:
CT ANGIOGRAPHY CHEST WITH CONTRAST
TECHNIQUE: Multidetector CT imaging of the chest was performed using the
standard protocol during bolus administration of intravenous
contrast. Multiplanar CT image reconstructions and MIPs were
obtained to evaluate the vascular anatomy.
CONTRAST:  100 mL of Isovue 370 IV contrast

[Series 506: thins pacs · axial · 0.68mm/px · z∈[+83,+303]mm · 16 of 250 slices shown]
[im 15/250  lung]
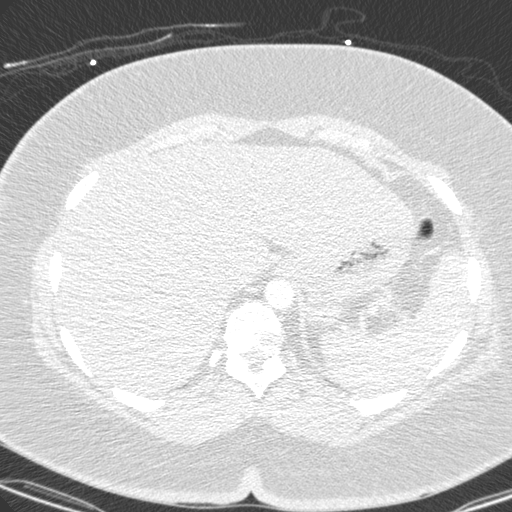
[im 30/250  mediastinal]
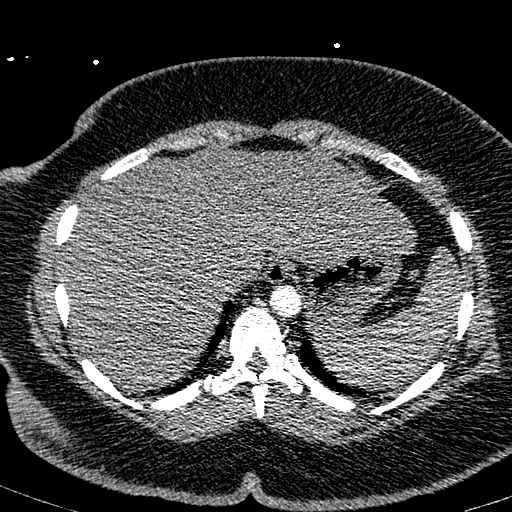
[im 44/250  lung]
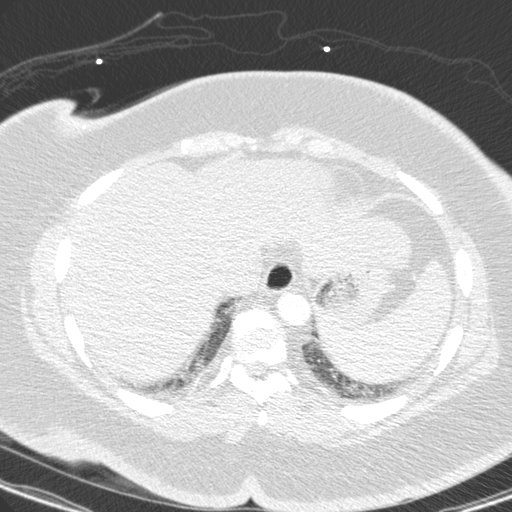
[im 59/250  mediastinal]
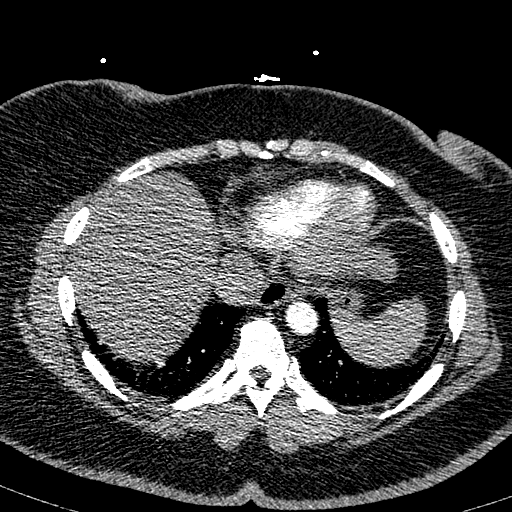
[im 74/250  lung]
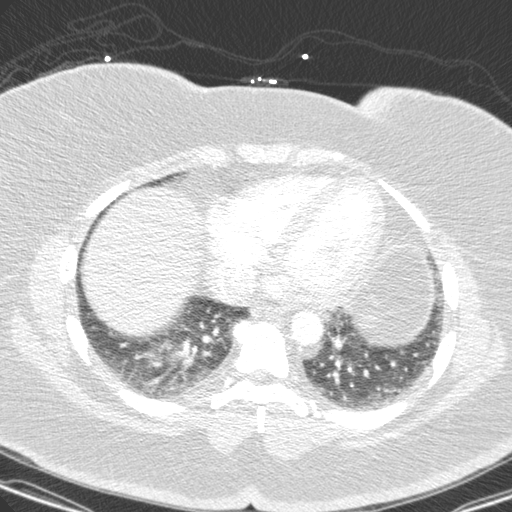
[im 88/250  mediastinal]
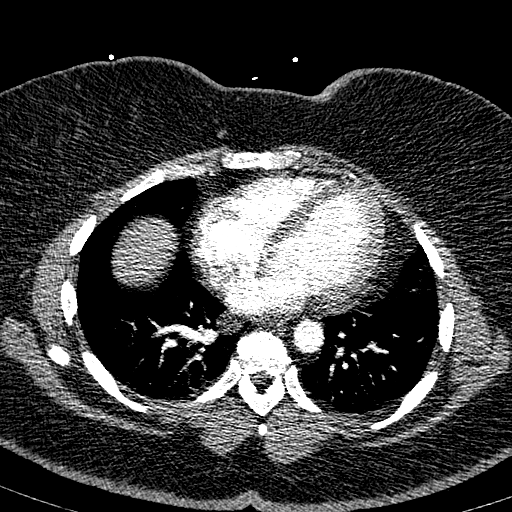
[im 103/250  lung]
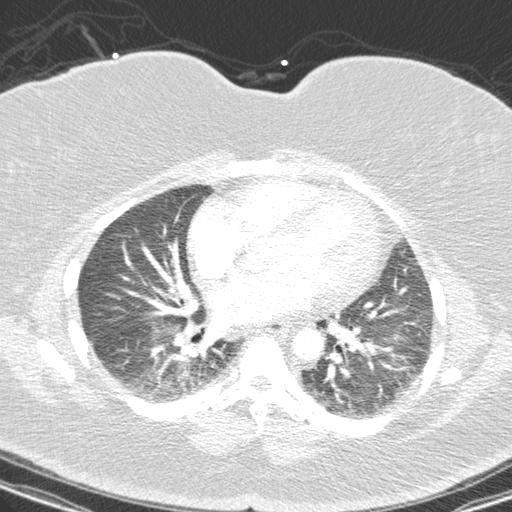
[im 118/250  mediastinal]
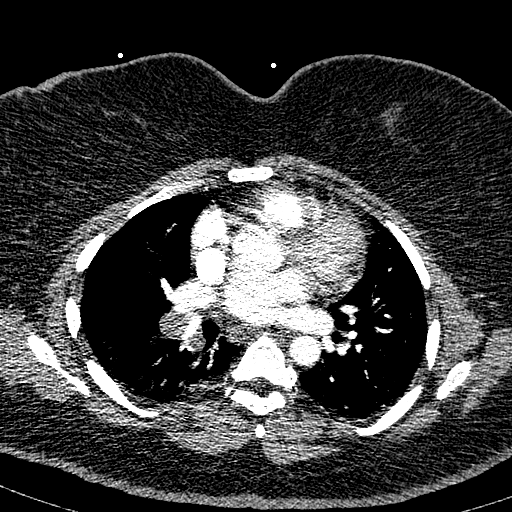
[im 132/250  lung]
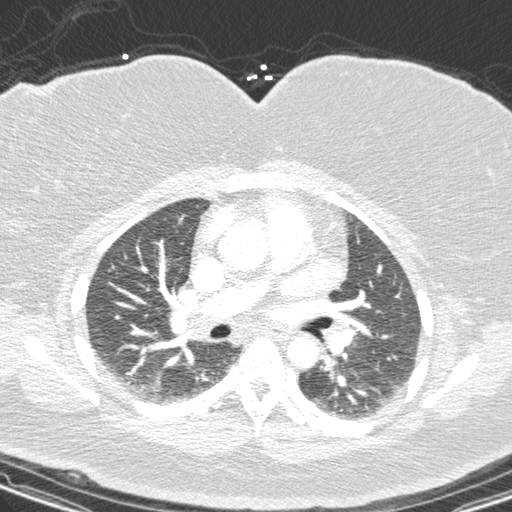
[im 147/250  mediastinal]
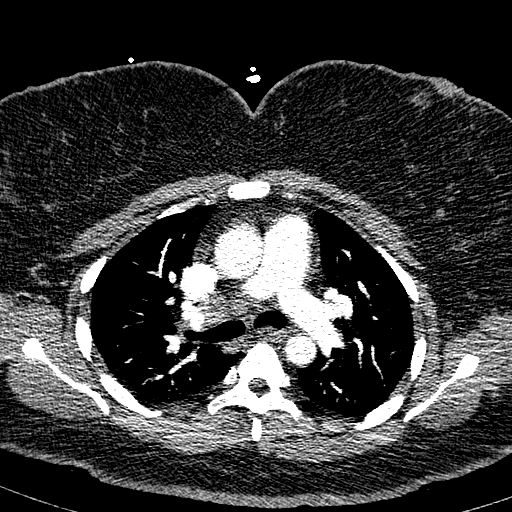
[im 162/250  lung]
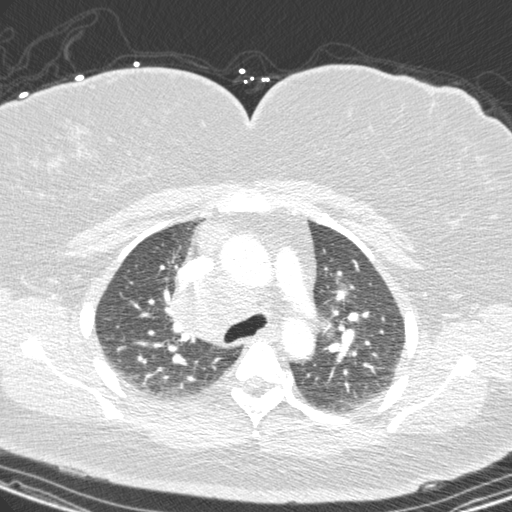
[im 176/250  mediastinal]
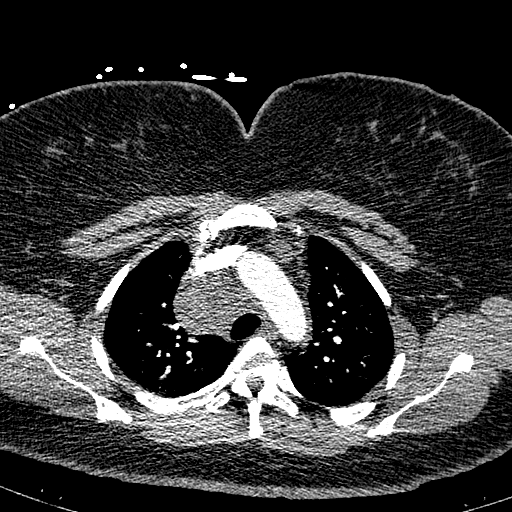
[im 191/250  lung]
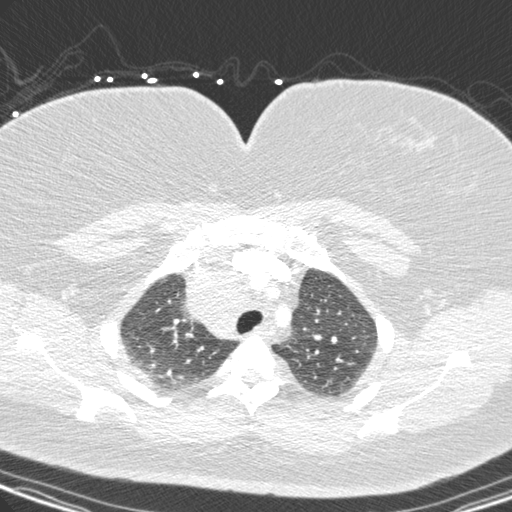
[im 206/250  mediastinal]
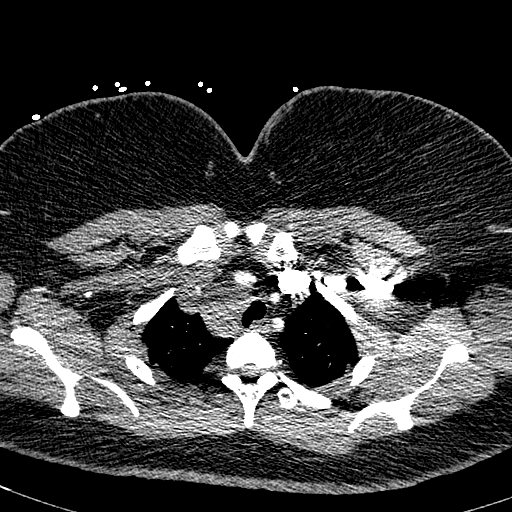
[im 220/250  lung]
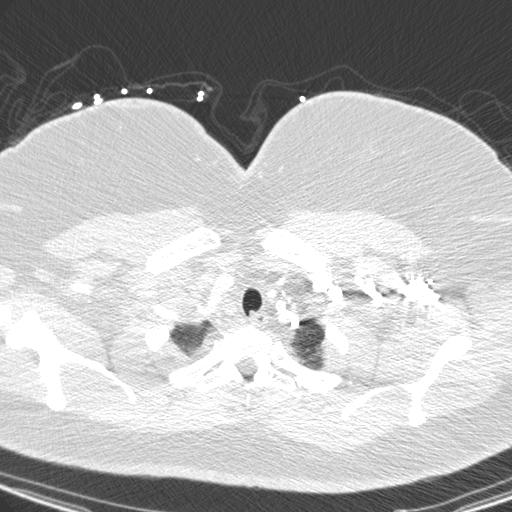
[im 235/250  mediastinal]
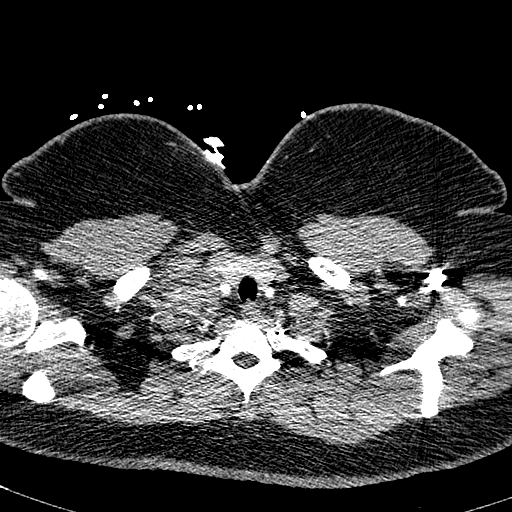

[16 of 36 positions shown; findings below may reference images not displayed]

FINDINGS: Cardiovascular: There is no evidence of pulmonary embolus. There is
mild mass effect on vasculature from the patient's mediastinal and
right hilar masses. The thoracic aorta remains grossly intact. The
great vessels are grossly unremarkable in appearance.

Mediastinum/Nodes: There is a large 7.2 x 6.0 x 4.8 cm mass at the
right paratracheal region, markedly enlarged from the prior study,
partially encasing the right side of the trachea and the right
mainstem bronchus, highly suspicious for small cell lung cancer.
There is mild mass effect on the right side of the trachea.

Right hilar and azygoesophageal recess nodes measure up to 2.0 cm in
short axis, concerning for metastatic disease. No pericardial
effusion is identified. Scattered prominent anterior mediastinal
nodes are seen, measuring up to 1.7 cm in short axis.

Lungs/Pleura: Bibasilar atelectasis is noted. No pleural effusion or
pneumothorax is seen. No pulmonary nodules are identified.

Upper Abdomen: The visualized portions of the liver and spleen are
grossly unremarkable.

Musculoskeletal: No acute osseous abnormalities are identified. The
visualized musculature is unremarkable in appearance.

Review of the MIP images confirms the above findings.
IMPRESSION: 1. No evidence of pulmonary embolus.
2. Large 7.2 x 6.0 x 4.8 cm mass at the right paratracheal region,
markedly enlarged from the prior study, partially encasing the right
side of the trachea and right mainstem bronchus, highly suspicious
for small cell lung cancer. Mild mass-effect on the right side of
the trachea.
3. Right hilar and azygoesophageal recess nodes measure up to 2.0 cm
in short axis. Prominent anterior mediastinal nodes measure up to
1.7 cm in short axis. These are concerning for metastatic disease.
4. Bibasilar atelectasis noted.
These results were called by telephone at the time of interpretation
on 12/26/2015 at [DATE] to ABELIN ORGAZ PA, who verbally
acknowledged these results.

## 2016-11-22 MED ORDER — SODIUM CHLORIDE 0.9% FLUSH
10.0000 mL | Freq: Once | INTRAVENOUS | Status: AC
  Administered 2016-11-22: 10 mL
  Filled 2016-11-22: qty 10

## 2016-11-22 MED ORDER — HEPARIN SOD (PORK) LOCK FLUSH 100 UNIT/ML IV SOLN
250.0000 [IU] | Freq: Once | INTRAVENOUS | Status: AC
  Administered 2016-11-22: 250 [IU]
  Filled 2016-11-22: qty 5

## 2016-11-23 IMAGING — CT CT ABD-PELV W/ CM
2 of 5 series · 9 of 36 positions shown, 11 images · IV contrast (Iodine)
Comparison: CT abdomen and pelvis May 18, 2010; chest CT
December 26, 2015

ADDENDUM:
There is questionable sludge within the gallbladder. It may be
prudent to consider ultrasound of the right upper quadrant given
questionable sludge in the gallbladder as well as prominence of the
common bile duct.
CLINICAL DATA: Chest adenopathy with concern for metastatic focus

EXAM:
CT ABDOMEN AND PELVIS WITH CONTRAST
TECHNIQUE: Multidetector CT imaging of the abdomen and pelvis was performed
using the standard protocol following bolus administration of
intravenous contrast. Oral contrast was also administered.
CONTRAST:  100mL [id number] IOPAMIDOL ([id number]) INJECTION 61%

[Series 201: cap with, idose (2) · axial · 0.68mm/px · z∈[-538,-178]mm · 6 of 102 slices shown, 8 images]
[im 15/102  mediastinal]
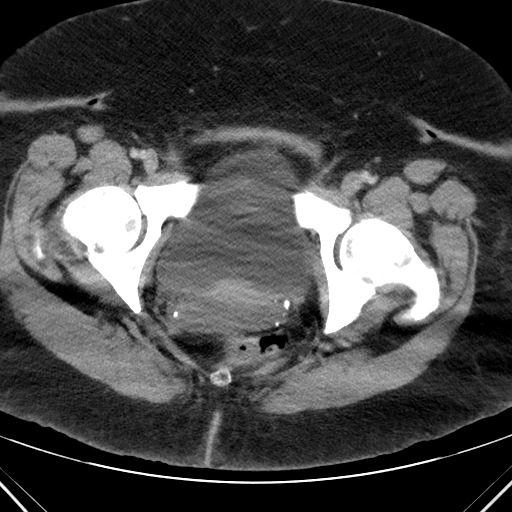
[im 15/102  lung]
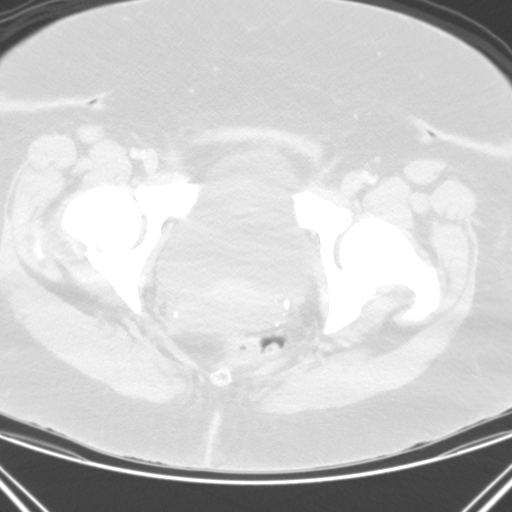
[im 29/102  lung]
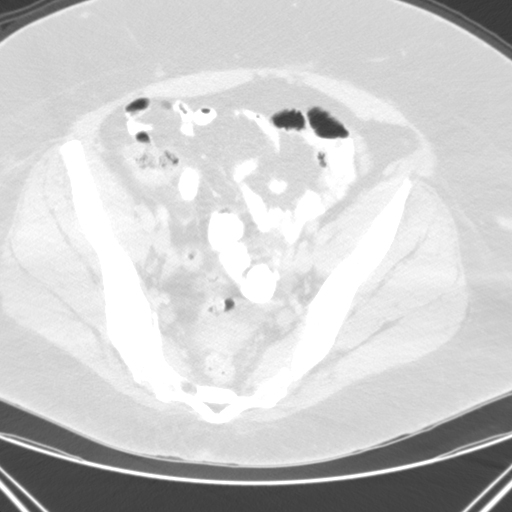
[im 44/102  lung]
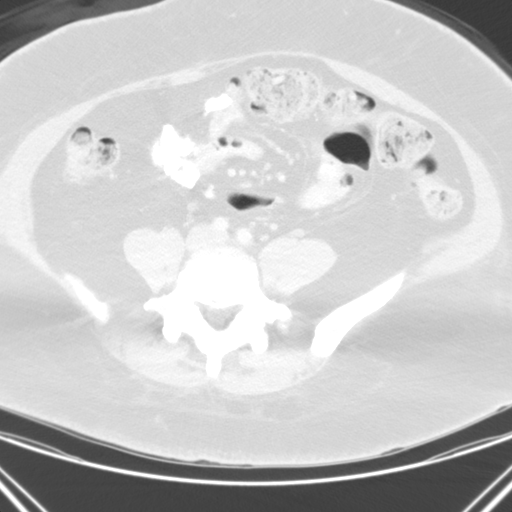
[im 58/102  lung]
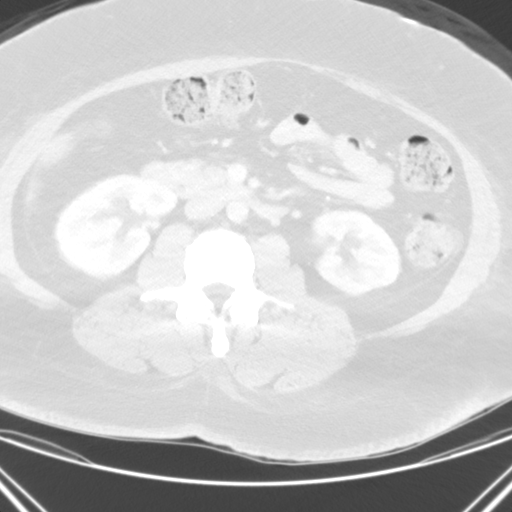
[im 73/102  mediastinal]
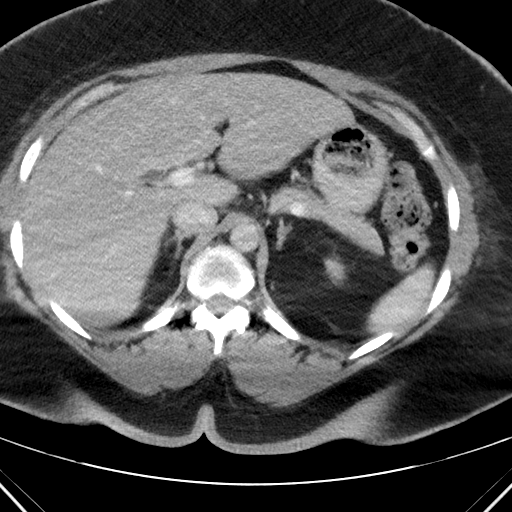
[im 73/102  lung]
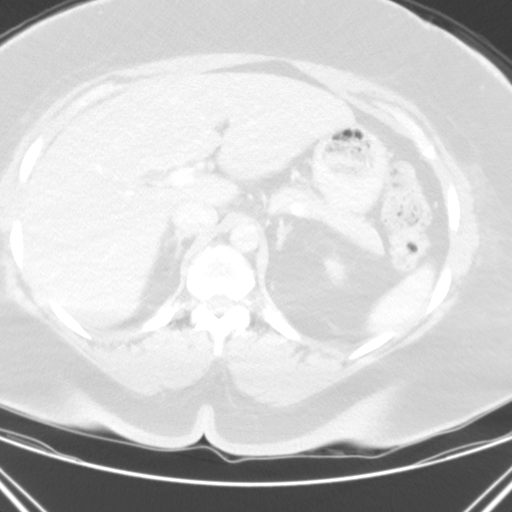
[im 87/102  lung]
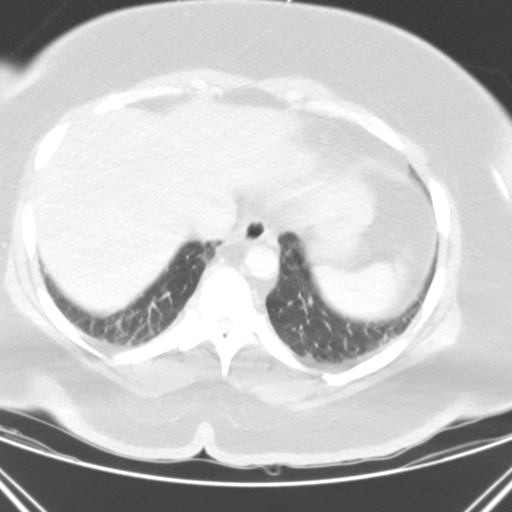

[Series 202: coronals, idose (2) · coronal · 0.45mm/px · 3 of 115 slices shown]
[im 23/115  lung]
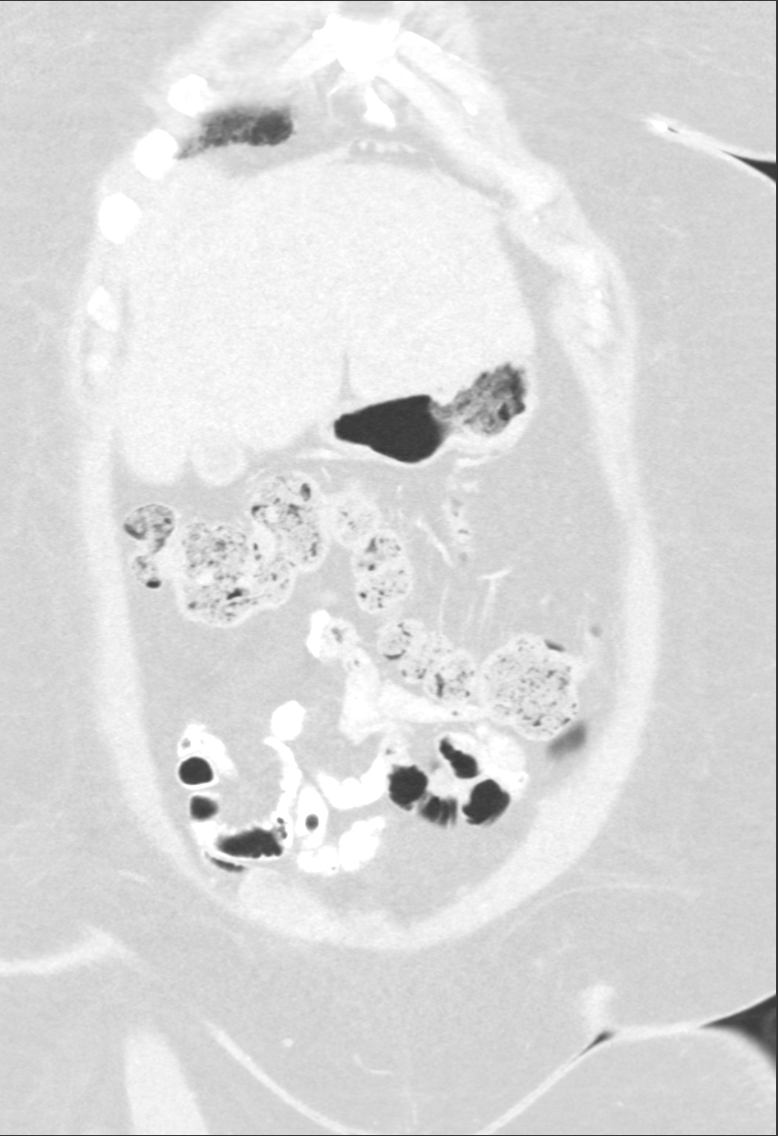
[im 46/115  lung]
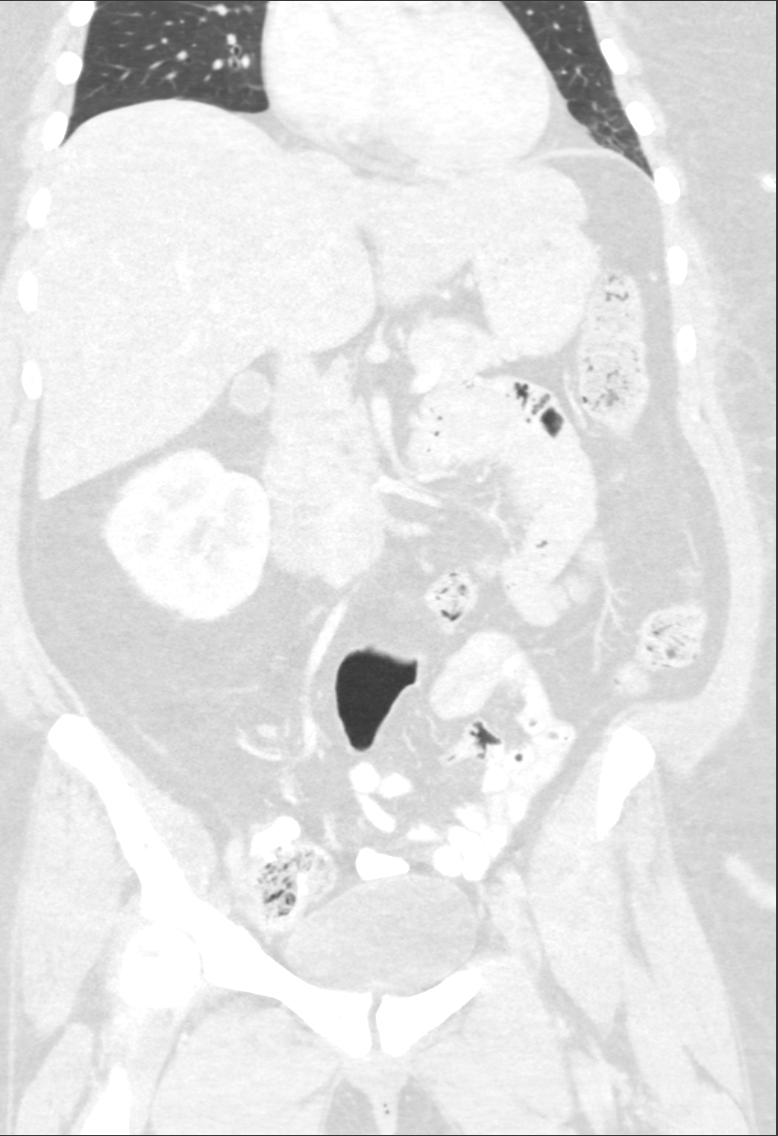
[im 69/115  lung]
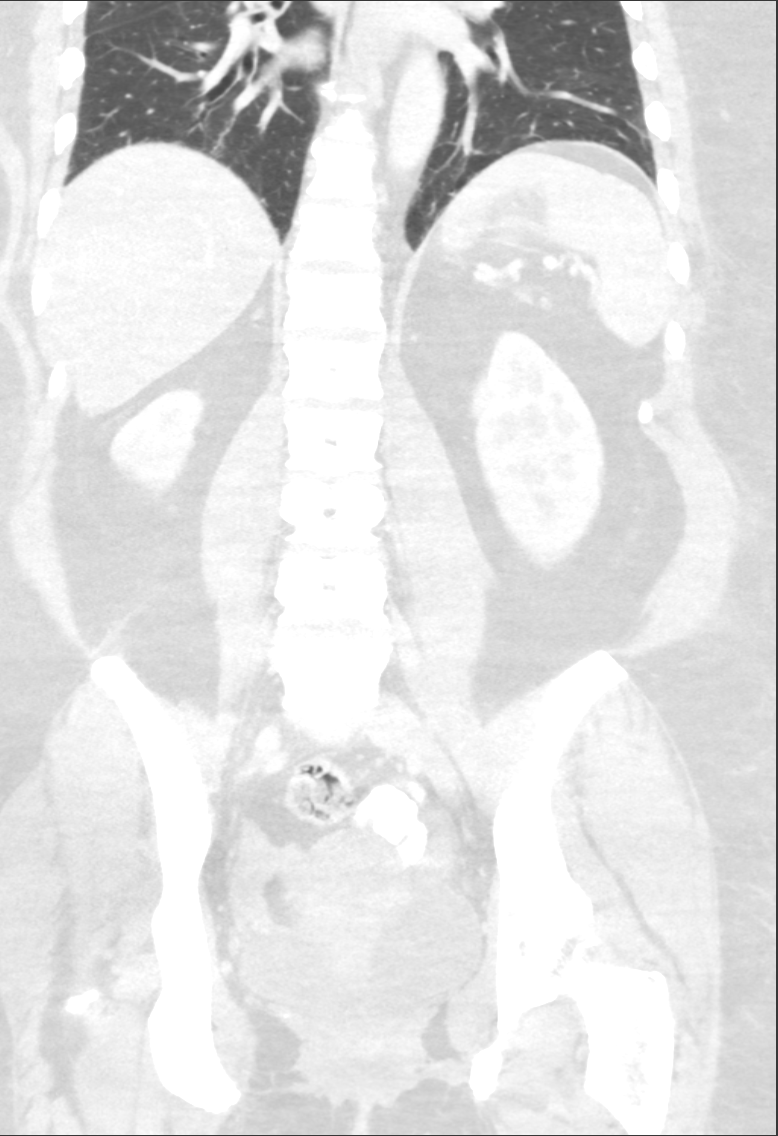

[9 of 36 positions shown; findings below may reference images not displayed]

FINDINGS: Lower chest: Focal opacity in the posterior aspect of the superior
segment right lower lobe is increased from 1 day prior and is
consistent with either increased atelectasis or new focus infiltrate
in this area. Lung bases otherwise are clear.

Hepatobiliary: No focal liver lesions are evident. There is no
appreciable gallbladder wall thickening. There is prominence of the
common bile duct 10 mm without mass or calculus evident in the
common bile duct on this study.

Pancreas: There is no pancreatic mass or fluid collection.

Spleen: No splenic lesions are evident.

Adrenals/Urinary Tract: There is a 1.1 x 0.9 cm mass in the left
adrenal. Right adrenal appears normal. Kidneys bilaterally show no
demonstrable mass or hydronephrosis on either side. There is no
renal or ureteral calculus on either side. Urinary bladder is
midline with wall thickness within normal limits.

Stomach/Bowel: There is fairly diffuse stool throughout colon. There
is no bowel wall or mesenteric thickening. No bowel obstruction. No
free air or portal venous air.

Vascular/Lymphatic: There is no abdominal aortic aneurysm. No
vascular lesions are evident. There are a few scattered
subcentimeter lymph nodes in the mesentery. By size criteria, there
is no adenopathy in the abdomen or pelvis.

Reproductive: Uterus is retroverted. There is no pelvic mass or
pelvic fluid collection.

Other: Appendix appears unremarkable. There is no ascites or abscess
in the abdomen or pelvis. There is a small ventral hernia containing
only fat. No omental lesions are demonstrable.

Musculoskeletal: There are foci of degenerative change in the lumbar
spine. There are no blastic or lytic bone lesions. There is no
intramuscular or abdominal wall lesion evident.
IMPRESSION: Small mass in left adrenal. A small metastasis must be of concern in
this region given the findings on recent chest CT.

No adenopathy is apparent in the abdomen or pelvis. There are
scattered subcentimeter mesenteric lymph nodes which do not meet
size criteria for pathologic significance. No liver lesions are
evident. No inflammatory foci are appreciated. No bowel obstruction.
No abscess. No renal or ureteral calculus. No hydronephrosis. There
is a small ventral hernia containing only fat. There is prominence
of the common bile duct without mass or calculus evident.
Significance of this finding is uncertain.
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
## 2016-11-24 ENCOUNTER — Ambulatory Visit
Admission: RE | Admit: 2016-11-24 | Discharge: 2016-11-24 | Disposition: A | Discharge status: Home / Self Care | Payer: Medicaid Other | Source: Ambulatory Visit | Attending: Radiation Oncology | Admitting: Radiation Oncology

## 2016-11-24 ENCOUNTER — Encounter: Payer: Self-pay | Admitting: Urology

## 2016-11-24 ENCOUNTER — Ambulatory Visit (HOSPITAL_BASED_OUTPATIENT_CLINIC_OR_DEPARTMENT_OTHER): Payer: Medicaid Other

## 2016-11-24 ENCOUNTER — Ambulatory Visit
Admission: RE | Admit: 2016-11-24 | Discharge: 2016-11-24 | Disposition: A | Discharge status: Home / Self Care | Payer: Medicaid Other | Source: Ambulatory Visit | Attending: Urology | Admitting: Urology

## 2016-11-24 DIAGNOSIS — Z95828 Presence of other vascular implants and grafts: Secondary | ICD-10-CM

## 2016-11-24 DIAGNOSIS — C792 Secondary malignant neoplasm of skin: Secondary | ICD-10-CM

## 2016-11-24 DIAGNOSIS — C3491 Malignant neoplasm of unspecified part of right bronchus or lung: Secondary | ICD-10-CM

## 2016-11-24 DIAGNOSIS — Z452 Encounter for adjustment and management of vascular access device: Secondary | ICD-10-CM

## 2016-11-24 DIAGNOSIS — C770 Secondary and unspecified malignant neoplasm of lymph nodes of head, face and neck: Secondary | ICD-10-CM

## 2016-11-24 IMAGING — CR DG FOOT 2V*R*
2 series · 2 of 2 positions shown · non-contrast
Comparison: None.

CLINICAL DATA: 50-year-old male with a history of right great toe
pain

EXAM:
RIGHT FOOT - 2 VIEW

[AP]
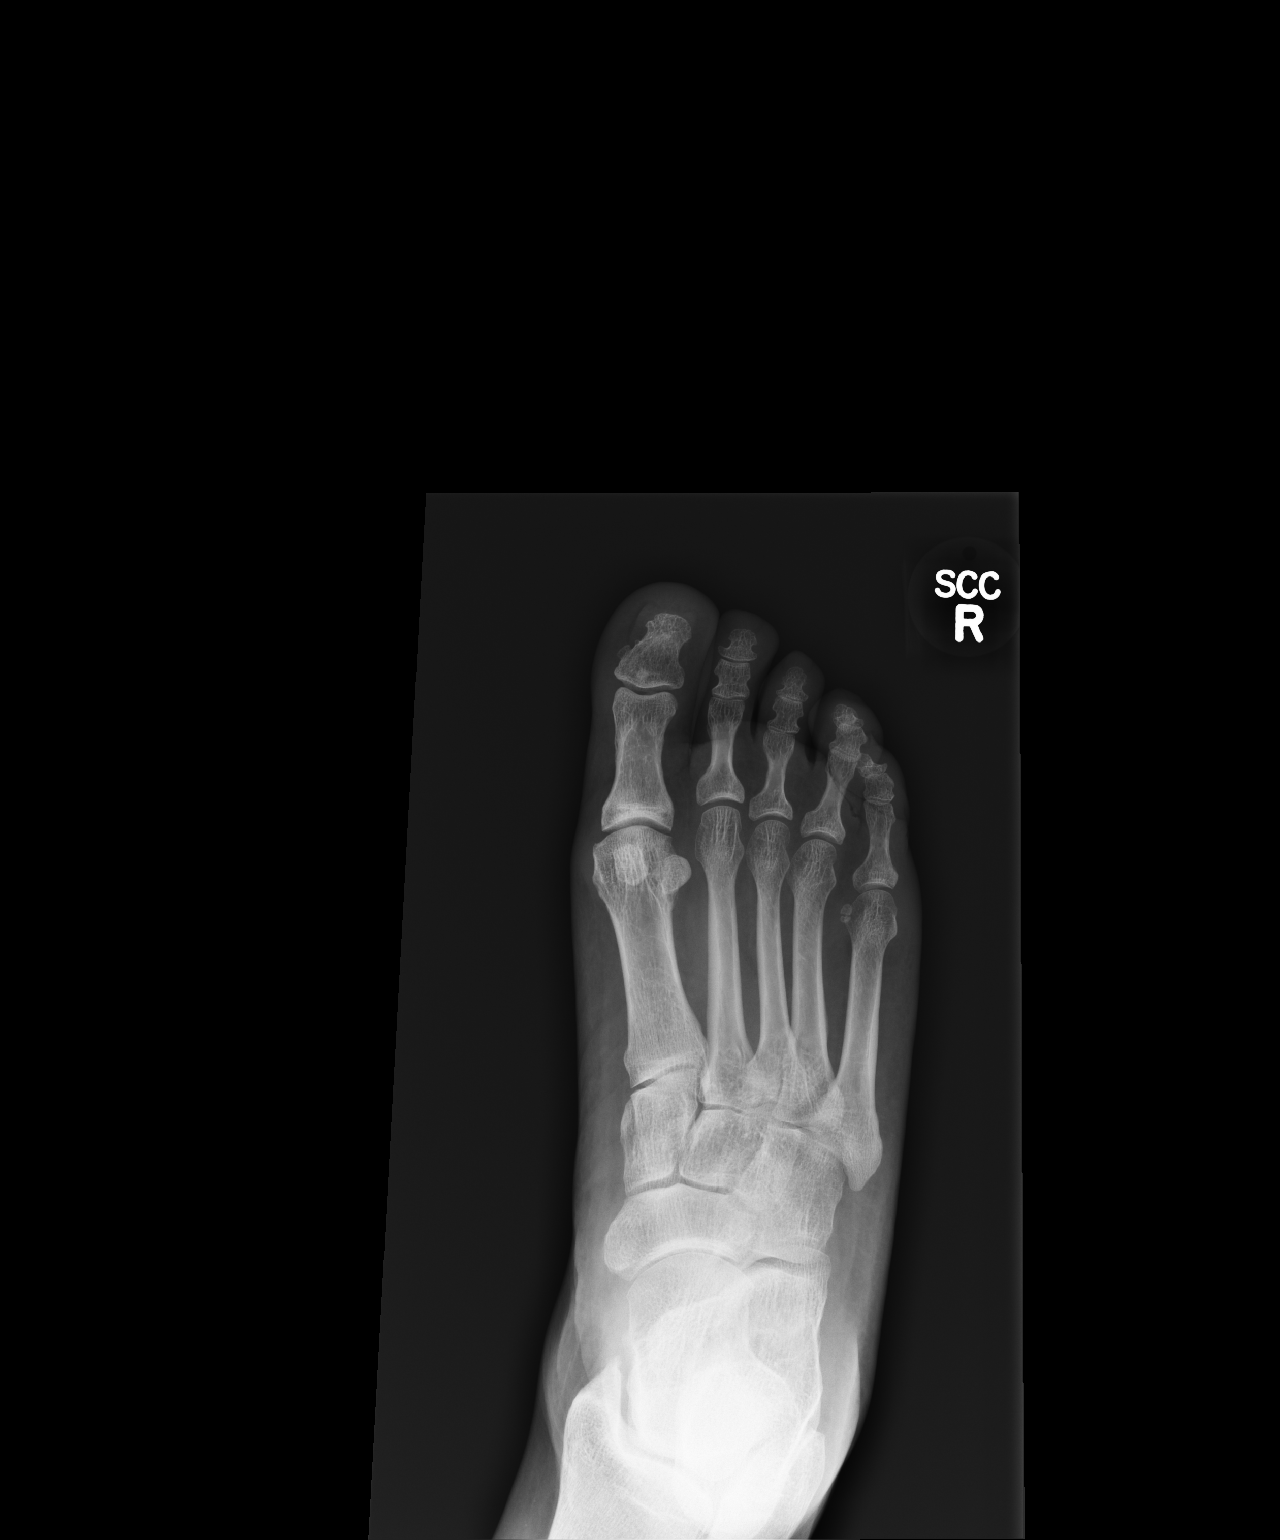

[lateral]
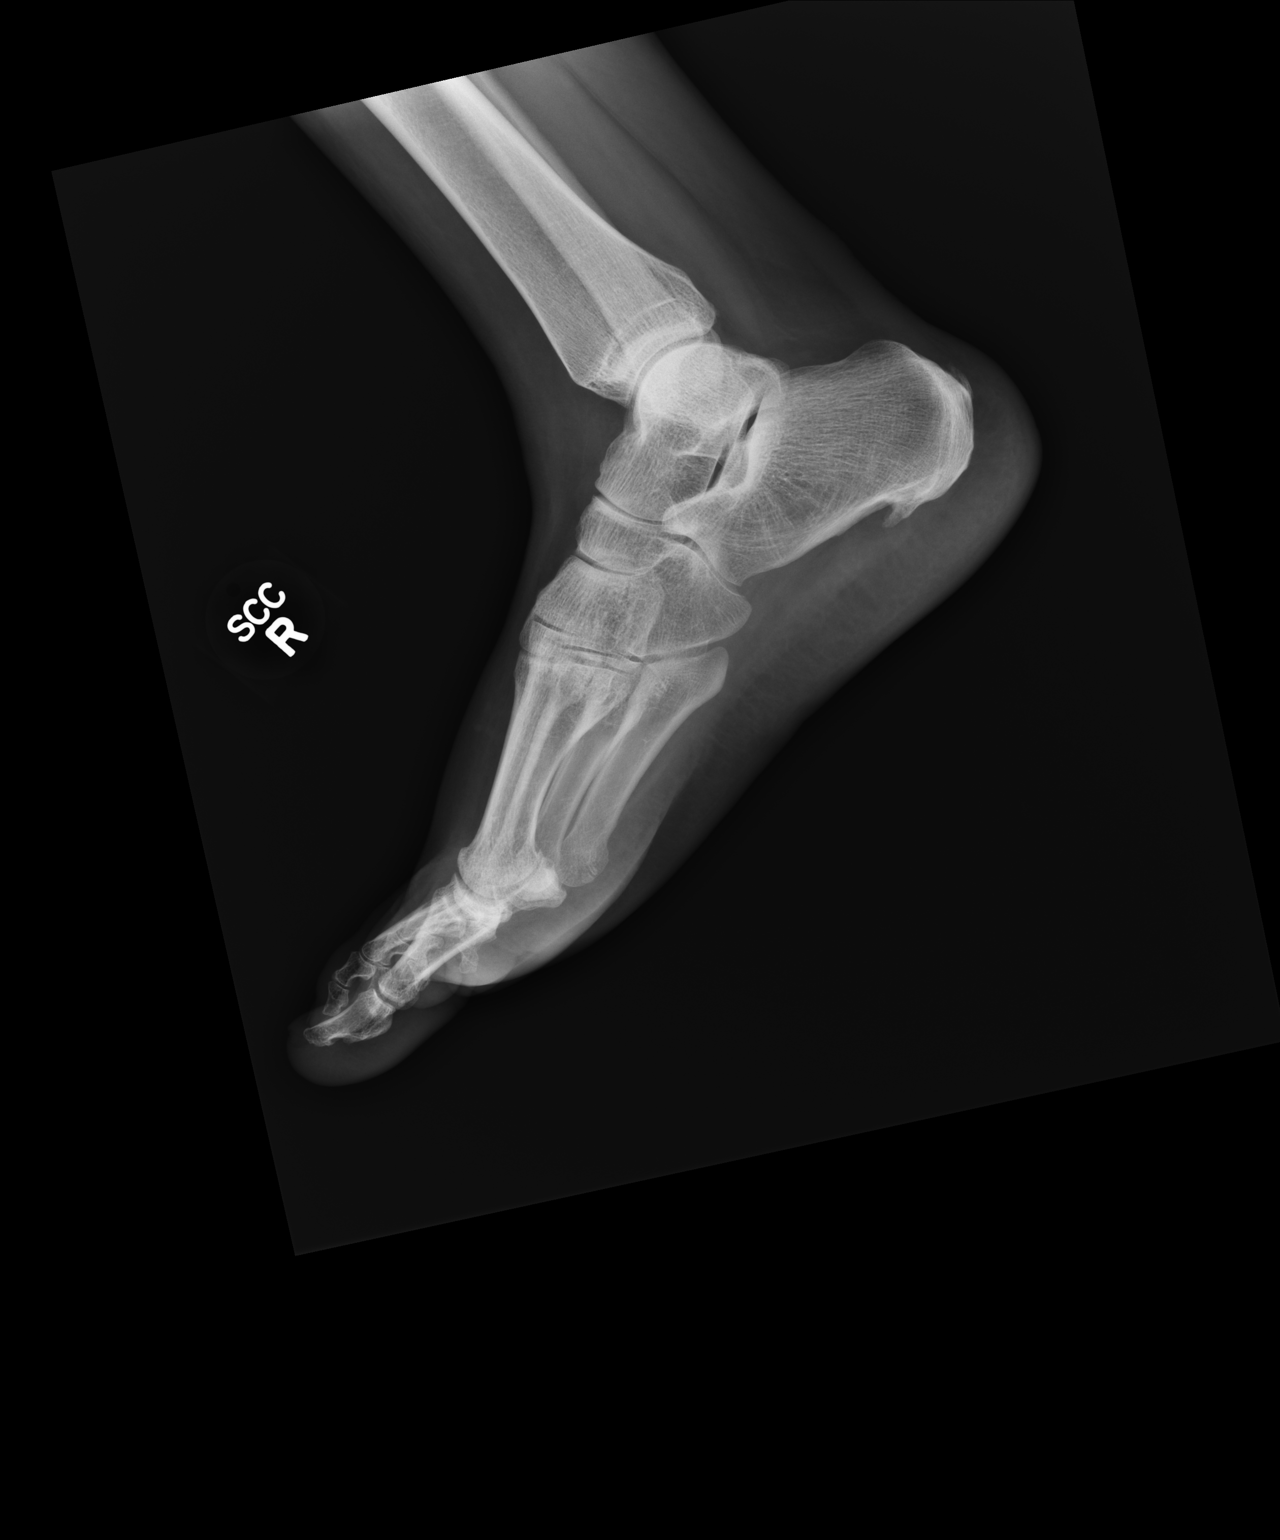

[2 of 2 positions shown; findings below may reference images not displayed]

FINDINGS: No displaced fracture identified.  No radiopaque foreign body.

Degenerative changes of the interphalangeal joints. Minimal
degenerative changes of the metatarsal-phalangeal joints.
Degenerative changes of the midfoot and hindfoot. No focal soft
tissue swelling.
IMPRESSION: Negative for acute bony abnormality.

## 2016-11-24 MED ORDER — SODIUM CHLORIDE 0.9 % IJ SOLN
10.0000 mL | INTRAMUSCULAR | Status: DC | PRN
Start: 2016-11-24 — End: 2016-11-24
  Administered 2016-11-24: 10 mL via INTRAVENOUS
  Filled 2016-11-24: qty 10

## 2016-11-24 MED ORDER — HEPARIN SOD (PORK) LOCK FLUSH 100 UNIT/ML IV SOLN
500.0000 [IU] | Freq: Once | INTRAVENOUS | Status: AC | PRN
  Administered 2016-11-24: 250 [IU] via INTRAVENOUS
  Filled 2016-11-24: qty 5

## 2016-11-24 NOTE — Progress Notes (Signed)
Radiation Oncology         [phone] ________________________________   outpatient Re-Consultation  Name: Marilyn [name] MRN: [id number]  Date of Service: 11/24/2016 DOB: October 21, 1965  CC:[name], FNP  [name], MD   REFERRING PHYSICIAN: [name], MD  DIAGNOSIS: 51 yo woman with a 5 cm right abdomen subcutaneous metastasis    ICD-10-CM   1. Primary cancer of right lung metastatic to other site [facility]) C34.91     HISTORY OF PRESENT ILLNESS: Marilyn [name] is a 51 y.o. female seen at the request of Dr. Irene Limbo for a new metastatic subcutaneous right flank mass. 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  MRI of the brain was negative at the time of diagnosis.  She was treated with concurrent chemoradiation with Carboplatin/Taxol- completed RT on 02/13/2016 and then completed 2 additional cycles of carboplatin + taxol.  She started maintainence Durvalumab on 05/24/2016 given q2weeks and has recently completed her 12th cycle.    She was evaluated in the ER on 10/29/16 with complaints of headaches, weakness, SOB and a new, painful right flank mass.  She first noticed the painful right flank mass 1 week prior and the pain was reported to be worsening in severity. The mass is tender to palpation and aggravated with certain positions, especially when she is seated and leaning forward.  She had not had a prior injury in this area to her knowledge. CTA chest on 10/29/2016 revealed no evidence of lung cancer progression in the chest and no PE.  CT abd/pelvis on 10/29/2016   revealed a lobulated, oval, heterogeneous enhancing mass in the right lateral subcutaneous fat at the level of the mid abdomen measuring 5.0 x 5.0 x 4.7 cm with features that were felt to be compatible with a metastasis or primary  neoplasm.  She underwent U/S guided core needle Bx of the right flank mass on 11/10/16 and pathology is consistent with metastatic non-small cell adenocarcinoma.  Brain MRI on 11/19/16 was negative for intracranial metastatic disease.  She presents today to discuss the possible role of palliative radiotherapy to the painful, isolated metastatic subcutaneous right  flank mass.  PREVIOUS RADIATION THERAPY: Yes - 01/05/16 - 02/13/16: The right lung was treated with 60 Gy in 30 fractions.  PAST MEDICAL HISTORY:  Past Medical History:  Diagnosis Date  . Asthma   . Diabetes mellitus   . Hypercholesteremia   . Hypertension   . Lung cancer ([place])   . Neuropathy   . Obese       PAST SURGICAL HISTORY: Past Surgical History:  Procedure Laterality Date  . CESAREAN SECTION    . IR GENERIC HISTORICAL  01/08/2016   IR US GUIDE VASC ACCESS RIGHT 01/08/2016 [name], DO [facility]  . IR GENERIC HISTORICAL  01/08/2016   IR FLUORO GUIDE CV LINE RIGHT 01/08/2016 [name], DO [facility]  . TUBAL LIGATION    . US guided core needle biopsy     of right lower neck/supraclavicular lymph nodes    FAMILY HISTORY:  Family History  Problem Relation Age of Onset  . Heart failure Mother   . Hypertension Mother   . Cancer Neg Hx   . Rheumatologic disease Neg Hx     SOCIAL HISTORY:  Social History   Social History  . Marital status: Divorced  Spouse name: N/A  . Number of children: N/A  . Years of education: N/A   Occupational History  . Not on file.   Social History Main Topics  . Smoking status: Never Smoker  . Smokeless tobacco: Never Used  . Alcohol use No  . Drug use: No  . Sexual activity: Not Currently   Other Topics Concern  . Not on file   Social History Narrative   No bird or mold exposure. No recent travel.    ALLERGIES: Patient has no known allergies.  MEDICATIONS:  Current Outpatient Prescriptions  Medication Sig Dispense Refill  . aspirin EC 81 MG tablet  Take 1 tablet (81 mg total) by mouth daily. 60 tablet 2  . diltiazem (CARDIZEM CD) 180 MG 24 hr capsule Take 1 capsule (180 mg total) by mouth daily. 30 capsule 1  . DULoxetine (CYMBALTA) 60 MG capsule Take 1 capsule (60 mg total) by mouth daily. 30 capsule 2  . insulin aspart (NOVOLOG) 100 UNIT/ML FlexPen As per sliding scale instructions (Patient taking differently: Inject 0-20 Units into the skin 3 (three) times daily as needed for high blood sugar. As per sliding scale instructions) 15 mL 2  . Insulin Degludec (TRESIBA FLEXTOUCH) 200 UNIT/ML SOPN Inject 100 Units into the skin daily before breakfast.     . levothyroxine (SYNTHROID, LEVOTHROID) 125 MCG tablet Take 1 tablet (125 mcg total) by mouth daily before breakfast. 30 tablet 2  . loratadine (CLARITIN) 10 MG tablet Take 10 mg by mouth daily.    [name] oxyCODONE-acetaminophen (PERCOCET/ROXICET) 5-325 MG tablet Take 1-2 tablets by mouth every 6 (six) hours as needed for moderate pain or severe pain. 40 tablet 0  . polyethylene glycol (MIRALAX) packet Take 17 g by mouth daily. 30 each 1  . senna-docusate (SENNA S) 8.6-50 MG tablet Take 2 tablets by mouth 2 (two) times daily. May reduce to 2 tab po HS as needed once regular BM estabished 60 tablet 1  . benzonatate (TESSALON) 100 MG capsule Take 1 capsule (100 mg total) by mouth 3 (three) times daily as needed for cough. (Patient not taking: Reported on 11/24/2016) 60 capsule 0   No current facility-administered medications for this encounter.    Facility-Administered Medications Ordered in Other Encounters  Medication Dose Route Frequency Provider Last Rate Last Dose  . sodium chloride 0.9 % injection 10 mL  10 mL Intravenous PRN [name], MD   10 mL at 05/24/16 1252    REVIEW OF SYSTEMS:  On review of systems, the patient reports that she is doing well overall. Her main complaint is of a painful, firm mass in her right flank that "feels like the size of an egg".  The pain is rated a  7/10 in severity but seems to be progressively worsening.  Certain positions aggravate the pain, especially when she is seated and leaning forward. She is most comfortable when she is lying flat.  She denies any chest pain, shortness of breath, cough, fevers, chills, night sweats, unintended weight changes. She denies any bowel or bladder disturbances, and denies abdominal pain aside from the painful mass as noted above, nausea or vomiting. She denies any new musculoskeletal or joint aches or pains. A complete review of systems is obtained and is otherwise negative.    PHYSICAL EXAM:  Wt Readings from Last 3 Encounters:  11/24/16 275 lb (124.7 kg)  11/15/16 272 lb 11.2 oz (123.7 kg)  11/10/16 267 lb 3.2 oz (121.2 kg)   Temp Readings  from Last 3 Encounters:  11/24/16 98.7 F (37.1 C) (Oral)  11/15/16 98.9 F (37.2 C) (Oral)  11/10/16 98 F (36.7 C) (Oral)   BP Readings from Last 3 Encounters:  11/24/16 (!) 147/99  11/15/16 138/86  11/10/16 111/82   Pulse Readings from Last 3 Encounters:  11/24/16 (!) 123  11/15/16 (!) 114  11/10/16 (!) 108   Pain Assessment Pain Score: 6  (Right abdomen)/10  In general this is a well appearing African-american female in no acute distress. She is alert and oriented x4 and appropriate throughout the examination. HEENT reveals that the patient is normocephalic, atraumatic. EOMs are intact. PERRLA. Skin is intact without any evidence of gross lesions. Cardiovascular exam reveals a regular rate and rhythm, no clicks rubs or murmurs are auscultated. Chest is clear to auscultation bilaterally. Lymphatic assessment is performed and does not reveal any adenopathy in the cervical, supraclavicular, axillary, or inguinal chains. Abdomen has active bowel sounds in all quadrants and is intact. The abdomen is soft, non tender, non distended. There is a palpable 5 x 5 cm mass in the right flank.  The mass is firm and tender to palpation. There is no associated erythema  or increased warmth about the lesion.  Lower extremities are negative for pretibial pitting edema, deep calf tenderness, cyanosis or clubbing.   KPS = 90  100 - Normal; no complaints; no evidence of disease. 90   - Able to carry on normal activity; minor signs or symptoms of disease. 80   - Normal activity with effort; some signs or symptoms of disease. 90   - Cares for self; unable to carry on normal activity or to do active work. 60   - Requires occasional assistance, but is able to care for most of his personal needs. 50   - Requires considerable assistance and frequent medical care. 44   - Disabled; requires special care and assistance. 1   - Severely disabled; hospital admission is indicated although death not imminent. 60   - Very sick; hospital admission necessary; active supportive treatment necessary. 10   - Moribund; fatal processes progressing rapidly. 0     - Dead  Karnofsky DA, Abelmann [place], Craver LS and Burchenal [facility] [phone]) The use of the nitrogen mustards in the palliative treatment of carcinoma: with particular reference to bronchogenic carcinoma Cancer 1 [id number]  LABORATORY DATA:  Lab Results  Component Value Date   WBC 5.5 11/15/2016   HGB 9.7 (L) 11/15/2016   HCT 29.7 (L) 11/15/2016   MCV 89.2 11/15/2016   PLT 267 11/15/2016   Lab Results  Component Value Date   NA 139 11/15/2016   K 3.8 11/15/2016   CL 103 10/29/2016   CO2 31 (H) 11/15/2016   Lab Results  Component Value Date   ALT 9 11/15/2016   AST 14 11/15/2016   ALKPHOS 85 11/15/2016   BILITOT 0.33 11/15/2016     RADIOGRAPHY: Dg Chest 2 View  Result Date: 10/29/2016 CLINICAL DATA:  Shortness of breath and chest pain EXAM: CHEST  2 VIEW COMPARISON:  09/13/2016 FINDINGS: Postradiation changes are again seen in the right perihilar and suprahilar region. Right-sided PICC line is again noted at the cavoatrial junction. No focal infiltrate or sizable effusion is seen. No acute bony abnormality is noted.  IMPRESSION: Chronic changes without acute abnormality. Electronically Signed   By: Inez Catalina M.D.   On: 10/29/2016 13:42   Ct Angio Chest Pe W/cm &/or Wo Cm  Result Date: 10/29/2016 CLINICAL  DATA:  [place].  History of lung cancer.  Right flank mass. EXAM: CT ANGIOGRAPHY CHEST CT ABDOMEN AND PELVIS WITH CONTRAST TECHNIQUE: Multidetector CT imaging of the chest was performed using the standard protocol during bolus administration of intravenous contrast. Multiplanar CT image reconstructions and MIPs were obtained to evaluate the vascular anatomy. Multidetector CT imaging of the abdomen and pelvis was performed using the standard protocol during bolus administration of intravenous contrast. CONTRAST:  100 cc Isovue 370 COMPARISON:  Chest radiographs obtained today. Chest CTA dated 07/05/2016. Chest CT dated 09/13/2016. Abdomen and pelvis CT dated 12/27/2015. FINDINGS: CTA CHEST FINDINGS Cardiovascular: Normally opacified pulmonary arteries with no pulmonary arterial filling defects seen. Small pericardial effusion with a maximum thickness of 8 mm. Mediastinum/Nodes: Soft tissue density in the precarinal mediastinum on the right, previously measuring 3.8 cm in maximum diameter, currently measures 2.9 cm and maximum diameter. The previously demonstrated 10 mm short axis subcarinal node continues to have a 10 mm short axis diameter on image number 43 of series 4. No enlarged lymph nodes elsewhere in the mediastinum. Unremarkable included thyroid gland. Lungs/Pleura: Postradiation changes in the medial aspect of the right lung are stable. Clear left lung. Musculoskeletal: Thoracic spine degenerative changes. Review of the MIP images confirms the above findings. CT ABDOMEN and PELVIS FINDINGS Hepatobiliary: Multiple large, minimally calcified gallstones in the gallbladder. The largest measures 1.6 cm in maximum diameter. No gallbladder wall thickening or pericholecystic fluid. Unremarkable liver. Pancreas:  Unremarkable. No pancreatic ductal dilatation or surrounding inflammatory changes. Spleen: Normal in size without focal abnormality. Adrenals/Urinary Tract: Adrenal glands are unremarkable. Kidneys are normal, without renal calculi, focal lesion, or hydronephrosis. Bladder is unremarkable. Stomach/Bowel: Unremarkable stomach, small bowel and colon. No evidence of appendicitis. Vascular/Lymphatic: No significant vascular findings are present. No enlarged abdominal or pelvic lymph nodes. Reproductive: Multiple uterine masses, measuring up to 4.1 cm in maximum diameter each. Normal appearing ovaries. Other: Lobulated, oval, heterogeneous enhancing mass in the right lateral subcutaneous fat at the level of the mid abdomen. This mass measures 5.0 x 4.7 cm in maximum dimensions on image number 48 of series 5. This measures 5.0 cm in length on coronal image number 91. Very small umbilical hernia containing fat. Musculoskeletal: Minimal lumbar spine degenerative changes. No evidence of bony metastatic disease. Review of the MIP images confirms the above findings. IMPRESSION: 1. No pulmonary emboli. 2. No acute chest, abdomen or pelvic abnormality. 3. Interval 5.0 x 5.0 x 4.7 cm mass in the right lateral subcutaneous fat at the level of the mid abdomen. This has CT features compatible with a metastasis or primary neoplasm. This would be amenable to ultrasound-guided core needle biopsy. 4. Interval decrease in size of the previously demonstrated precarinal mediastinal mass. 5. Stable mildly enlarged subcarinal lymph node. 6. Stable right lung postradiation changes. 7. Cholelithiasis without evidence of cholecystitis. 8. Small pericardial effusion with a maximum thickness of 8 mm. Electronically Signed   By: Claudie Revering M.D.   On: 10/29/2016 15:52   Mr Jeri Cos YP Contrast  Result Date: 11/19/2016 CLINICAL DATA:  Non-small-cell lung cancer staging. EXAM: MRI HEAD WITHOUT AND WITH CONTRAST TECHNIQUE: Multiplanar, multiecho  pulse sequences of the brain and surrounding structures were obtained without and with intravenous contrast. CONTRAST:  62mL MULTIHANCE GADOBENATE DIMEGLUMINE 529 MG/ML IV SOLN COMPARISON:  Brain MRI 12/26/2015 FINDINGS: Brain: The midline structures are normal. There is no focal diffusion restriction to indicate acute infarct. Scattered white matter hyperintensities, slightly greater than expected at this age. No  intraparenchymal hematoma or chronic microhemorrhage. Brain volume is normal for age without lobar predominant atrophy. The dura is normal and there is no extra-axial collection. Vascular: Major intracranial arterial and venous sinus flow voids are preserved. Skull and upper cervical spine: The visualized skull base, calvarium, upper cervical spine and extracranial soft tissues are normal. Sinuses/Orbits: No fluid levels or advanced mucosal thickening. No mastoid or middle ear effusion. Normal orbits. IMPRESSION: No intracranial or calvarial metastatic disease. Electronically Signed   By: Ulyses Jarred M.D.   On: 11/19/2016 17:26   Ct Abdomen Pelvis W Contrast  Result Date: 10/29/2016 CLINICAL DATA:  [place].  History of lung cancer.  Right flank mass. EXAM: CT ANGIOGRAPHY CHEST CT ABDOMEN AND PELVIS WITH CONTRAST TECHNIQUE: Multidetector CT imaging of the chest was performed using the standard protocol during bolus administration of intravenous contrast. Multiplanar CT image reconstructions and MIPs were obtained to evaluate the vascular anatomy. Multidetector CT imaging of the abdomen and pelvis was performed using the standard protocol during bolus administration of intravenous contrast. CONTRAST:  100 cc Isovue 370 COMPARISON:  Chest radiographs obtained today. Chest CTA dated 07/05/2016. Chest CT dated 09/13/2016. Abdomen and pelvis CT dated 12/27/2015. FINDINGS: CTA CHEST FINDINGS Cardiovascular: Normally opacified pulmonary arteries with no pulmonary arterial filling defects seen. Small  pericardial effusion with a maximum thickness of 8 mm. Mediastinum/Nodes: Soft tissue density in the precarinal mediastinum on the right, previously measuring 3.8 cm in maximum diameter, currently measures 2.9 cm and maximum diameter. The previously demonstrated 10 mm short axis subcarinal node continues to have a 10 mm short axis diameter on image number 43 of series 4. No enlarged lymph nodes elsewhere in the mediastinum. Unremarkable included thyroid gland. Lungs/Pleura: Postradiation changes in the medial aspect of the right lung are stable. Clear left lung. Musculoskeletal: Thoracic spine degenerative changes. Review of the MIP images confirms the above findings. CT ABDOMEN and PELVIS FINDINGS Hepatobiliary: Multiple large, minimally calcified gallstones in the gallbladder. The largest measures 1.6 cm in maximum diameter. No gallbladder wall thickening or pericholecystic fluid. Unremarkable liver. Pancreas: Unremarkable. No pancreatic ductal dilatation or surrounding inflammatory changes. Spleen: Normal in size without focal abnormality. Adrenals/Urinary Tract: Adrenal glands are unremarkable. Kidneys are normal, without renal calculi, focal lesion, or hydronephrosis. Bladder is unremarkable. Stomach/Bowel: Unremarkable stomach, small bowel and colon. No evidence of appendicitis. Vascular/Lymphatic: No significant vascular findings are present. No enlarged abdominal or pelvic lymph nodes. Reproductive: Multiple uterine masses, measuring up to 4.1 cm in maximum diameter each. Normal appearing ovaries. Other: Lobulated, oval, heterogeneous enhancing mass in the right lateral subcutaneous fat at the level of the mid abdomen. This mass measures 5.0 x 4.7 cm in maximum dimensions on image number 48 of series 5. This measures 5.0 cm in length on coronal image number 91. Very small umbilical hernia containing fat. Musculoskeletal: Minimal lumbar spine degenerative changes. No evidence of bony metastatic disease.  Review of the MIP images confirms the above findings. IMPRESSION: 1. No pulmonary emboli. 2. No acute chest, abdomen or pelvic abnormality. 3. Interval 5.0 x 5.0 x 4.7 cm mass in the right lateral subcutaneous fat at the level of the mid abdomen. This has CT features compatible with a metastasis or primary neoplasm. This would be amenable to ultrasound-guided core needle biopsy. 4. Interval decrease in size of the previously demonstrated precarinal mediastinal mass. 5. Stable mildly enlarged subcarinal lymph node. 6. Stable right lung postradiation changes. 7. Cholelithiasis without evidence of cholecystitis. 8. Small pericardial effusion with a maximum thickness  of 8 mm. Electronically Signed   By: Claudie Revering M.D.   On: 10/29/2016 15:52   US Biopsy  Result Date: 11/10/2016 INDICATION: 51 year old female with a history of right flank mass concerning for metastatic lung carcinoma EXAM: ULTRASOUND-GUIDED BIOPSY RIGHT FLANK MASS MEDICATIONS: None. ANESTHESIA/SEDATION: Moderate (conscious) sedation was employed during this procedure. A total of Versed 1.0 mg and Fentanyl 50 mcg was administered intravenously. Moderate Sedation Time: 10 minutes. The patient's level of consciousness and vital signs were monitored continuously by radiology nursing throughout the procedure under my direct supervision. FLUOROSCOPY TIME:  Ultrasound COMPLICATIONS: None PROCEDURE: Informed written consent was obtained from the patient after a thorough discussion of the procedural risks, benefits and alternatives. All questions were addressed. Maximal Sterile Barrier Technique was utilized including caps, mask, sterile gowns, sterile gloves, sterile drape, hand hygiene and skin antiseptic. A timeout was performed prior to the initiation of the procedure. Patient positioned supine position on the ultrasound gantry. Images of the right flank were performed with images stored and sent to PACs. The patient was then prepped and draped in the  usual sterile fashion. The skin and subcutaneous tissues were generously infiltrated 1% lidocaine for local anesthesia. A small stab incision was made with 11 blade scalpel. Using ultrasound guidance, multiple 18 gauge core biopsy were acquired and placed into formalin solution. Final image was stored and a sterile bandage was placed. Patient tolerated the procedure well and remained hemodynamically stable throughout. No complications were encountered and no significant blood loss. IMPRESSION: Status post ultrasound-guided biopsy of right-sided flank mass with multiple 18 gauge core biopsy. Tissue specimen sent to pathology for complete histopathologic analysis. Signed, Dulcy Fanny. Earleen Newport, DO [facility] Electronically Signed   By: [name] D.O.   On: 11/10/2016 13:39      IMPRESSION/PLAN: 1. 51 y.o. African-american female with Stage IIIB Lung Adenocarcinoma now with a painful right flank subcutaneous metastasis  Today, we talked to the patient and her mother about the findings and work-up thus far.  We discussed the natural history of metastatic lung cancer and general treatment, highlighting the role of palliative radiotherapy in the management.  We discussed the available radiation techniques, and focused on the details of logistics and delivery.  The recommendation is for a course of 10 daily radiation treatments to the right flank mass.  We reviewed the anticipated acute and late sequelae associated with radiation in this setting.  The patient was encouraged to ask questions that we answered to the best of our ability.    At the end of our discussion, the patient elects to proceed with radiation and will be scheduled for CT simulation on Friday, 11/26/16 at 8am.  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  She freely signed a written consent today in the office and this  document was placed in her medical chart.  The patient was provided a copy as well.  She knows to call with any questions or concerns.  We spent 60 minutes minutes face to face with the patient and more than 50% of that time was spent in counseling and/or coordination of care.   Nicholos Johns, PA-C    Tyler Pita, MD  [facility] Direct Dial: [phone]  Fax: [phone] [email or web address]  Skype  [email or web address]  This document serves as a record of services personally performed by Tyler Pita, MD and Freeman Caldron, PA-C.  It was created on their behalf by Linward Natal, a trained medical scribe. The creation of this record is based on the scribe's personal observations and the provider's statements to them. This document has been checked and approved by the attending provider.

## 2016-11-26 ENCOUNTER — Ambulatory Visit (HOSPITAL_BASED_OUTPATIENT_CLINIC_OR_DEPARTMENT_OTHER): Payer: Medicaid Other

## 2016-11-26 ENCOUNTER — Ambulatory Visit
Admission: RE | Admit: 2016-11-26 | Discharge: 2016-11-26 | Disposition: A | Discharge status: Home / Self Care | Payer: Medicaid Other | Source: Ambulatory Visit | Attending: Radiation Oncology | Admitting: Radiation Oncology

## 2016-11-26 DIAGNOSIS — C3491 Malignant neoplasm of unspecified part of right bronchus or lung: Secondary | ICD-10-CM | POA: Diagnosis not present

## 2016-11-26 DIAGNOSIS — Z95828 Presence of other vascular implants and grafts: Secondary | ICD-10-CM

## 2016-11-26 DIAGNOSIS — Z452 Encounter for adjustment and management of vascular access device: Secondary | ICD-10-CM | POA: Diagnosis present

## 2016-11-26 IMAGING — RF DG SWALLOWING FUNCTION - NRPT [facility]
1 series · 1 of 1 positions shown · non-contrast
Comparison: none

[Series 1: cp_standard · 0.25mm/px · 1 of 1 slices shown]
[im 1/1]
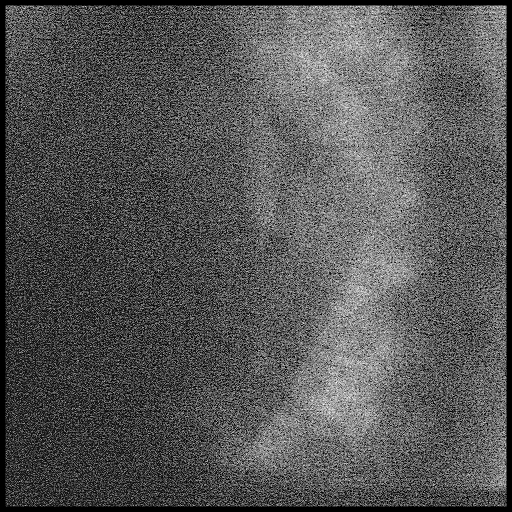

[1 of 1 positions shown; findings below may reference images not displayed]

FLUOROSCOPY FOR SWALLOWING FUNCTION STUDY:
Fluoroscopy was provided for swallowing function study, which was administered by a speech pathologist.  Final results and recommendations from this study are contained within the speech pathology report.

## 2016-11-26 MED ORDER — SODIUM CHLORIDE 0.9 % IJ SOLN
10.0000 mL | INTRAMUSCULAR | Status: DC | PRN
  Administered 2016-11-26: 10 mL via INTRAVENOUS
  Filled 2016-11-26: qty 10

## 2016-11-26 MED ORDER — HEPARIN SOD (PORK) LOCK FLUSH 100 UNIT/ML IV SOLN
500.0000 [IU] | Freq: Once | INTRAVENOUS | Status: AC | PRN
  Administered 2016-11-26: 250 [IU] via INTRAVENOUS
  Filled 2016-11-26: qty 5

## 2016-11-28 NOTE — Progress Notes (Signed)
  Radiation Oncology         [phone] ________________________________  Name: Marilyn [name] MRN: [id number]  Date: 11/26/2016  DOB: November 25, 1965  SIMULATION AND TREATMENT PLANNING NOTE    ICD-10-CM   1. Primary cancer of right lung metastatic to other site [facility]) C34.91     DIAGNOSIS:  51 yo woman with a 5 cm right lateral abdomen subcutaneous metastasis  NARRATIVE:  The patient was brought to the [facility].  Identity was confirmed.  All relevant records and images related to the planned course of therapy were reviewed.  The patient freely provided informed written consent to proceed with treatment after reviewing the details related to the planned course of therapy. The consent form was witnessed and verified by the simulation staff.  Then, the patient was set-up in a stable reproducible  supine position for radiation therapy.  CT images were obtained.  Surface markings were placed.  The CT images were loaded into the planning software.  Then the target and avoidance structures were contoured.  Treatment planning then occurred.  The radiation prescription was entered and confirmed.  Then, I designed and supervised the construction of a total of 4 medically necessary complex treatment devices with VacLoc and 3 MLCs to shield the bowel and kidneys.  I have requested : 3D Simulation  I have requested a DVH of the following structures: Left Kidney, Right Kidney, Small Bowel and target.    PLAN:  The patient will receive 30 Gy in 10 fractions.  ________________________________  Sheral Apley Tammi Klippel, M.D.

## 2016-11-29 ENCOUNTER — Ambulatory Visit (HOSPITAL_BASED_OUTPATIENT_CLINIC_OR_DEPARTMENT_OTHER): Payer: Medicaid Other

## 2016-11-29 ENCOUNTER — Other Ambulatory Visit (HOSPITAL_BASED_OUTPATIENT_CLINIC_OR_DEPARTMENT_OTHER): Payer: Medicaid Other

## 2016-11-29 ENCOUNTER — Encounter: Payer: Self-pay | Admitting: Hematology

## 2016-11-29 ENCOUNTER — Ambulatory Visit: Payer: Medicaid Other

## 2016-11-29 ENCOUNTER — Ambulatory Visit (HOSPITAL_BASED_OUTPATIENT_CLINIC_OR_DEPARTMENT_OTHER): Payer: Medicaid Other | Admitting: Hematology

## 2016-11-29 VITALS — BP 113/81 | HR 117 | Temp 98.2°F | Resp 18 | Ht 69.5 in | Wt 277.6 lb

## 2016-11-29 DIAGNOSIS — R5383 Other fatigue: Secondary | ICD-10-CM | POA: Diagnosis not present

## 2016-11-29 DIAGNOSIS — I10 Essential (primary) hypertension: Secondary | ICD-10-CM

## 2016-11-29 DIAGNOSIS — E039 Hypothyroidism, unspecified: Secondary | ICD-10-CM

## 2016-11-29 DIAGNOSIS — C3491 Malignant neoplasm of unspecified part of right bronchus or lung: Secondary | ICD-10-CM

## 2016-11-29 DIAGNOSIS — E279 Disorder of adrenal gland, unspecified: Secondary | ICD-10-CM

## 2016-11-29 DIAGNOSIS — Z79899 Other long term (current) drug therapy: Secondary | ICD-10-CM | POA: Diagnosis not present

## 2016-11-29 DIAGNOSIS — Z5112 Encounter for antineoplastic immunotherapy: Secondary | ICD-10-CM | POA: Diagnosis not present

## 2016-11-29 DIAGNOSIS — C792 Secondary malignant neoplasm of skin: Secondary | ICD-10-CM

## 2016-11-29 DIAGNOSIS — Z452 Encounter for adjustment and management of vascular access device: Secondary | ICD-10-CM

## 2016-11-29 DIAGNOSIS — D6481 Anemia due to antineoplastic chemotherapy: Secondary | ICD-10-CM | POA: Diagnosis not present

## 2016-11-29 DIAGNOSIS — E1140 Type 2 diabetes mellitus with diabetic neuropathy, unspecified: Secondary | ICD-10-CM

## 2016-11-29 DIAGNOSIS — R05 Cough: Secondary | ICD-10-CM

## 2016-11-29 DIAGNOSIS — G620 Drug-induced polyneuropathy: Secondary | ICD-10-CM | POA: Diagnosis not present

## 2016-11-29 DIAGNOSIS — E032 Hypothyroidism due to medicaments and other exogenous substances: Secondary | ICD-10-CM

## 2016-11-29 DIAGNOSIS — C3492 Malignant neoplasm of unspecified part of left bronchus or lung: Secondary | ICD-10-CM

## 2016-11-29 DIAGNOSIS — R059 Cough, unspecified: Secondary | ICD-10-CM

## 2016-11-29 DIAGNOSIS — E119 Type 2 diabetes mellitus without complications: Secondary | ICD-10-CM | POA: Diagnosis not present

## 2016-11-29 DIAGNOSIS — C770 Secondary and unspecified malignant neoplasm of lymph nodes of head, face and neck: Secondary | ICD-10-CM

## 2016-11-29 DIAGNOSIS — Z95828 Presence of other vascular implants and grafts: Secondary | ICD-10-CM

## 2016-11-29 DIAGNOSIS — G893 Neoplasm related pain (acute) (chronic): Secondary | ICD-10-CM | POA: Diagnosis not present

## 2016-11-29 DIAGNOSIS — Z794 Long term (current) use of insulin: Secondary | ICD-10-CM

## 2016-11-29 LAB — COMPREHENSIVE METABOLIC PANEL
ALBUMIN: 3.3 g/dL — AB (ref 3.5–5.0)
ALK PHOS: 87 U/L (ref 40–150)
ALT: 11 U/L (ref 0–55)
AST: 20 U/L (ref 5–34)
Anion Gap: 9 mEq/L (ref 3–11)
BILIRUBIN TOTAL: 0.34 mg/dL (ref 0.20–1.20)
BUN: 10.6 mg/dL (ref 7.0–26.0)
CALCIUM: 9.8 mg/dL (ref 8.4–10.4)
CO2: 29 meq/L (ref 22–29)
Chloride: 101 mEq/L (ref 98–109)
Creatinine: 0.9 mg/dL (ref 0.6–1.1)
GLUCOSE: 233 mg/dL — AB (ref 70–140)
POTASSIUM: 3.9 meq/L (ref 3.5–5.1)
SODIUM: 139 meq/L (ref 136–145)
Total Protein: 7.8 g/dL (ref 6.4–8.3)

## 2016-11-29 LAB — CBC & DIFF AND RETIC
BASO%: 0.4 % (ref 0.0–2.0)
Basophils Absolute: 0 10*3/uL (ref 0.0–0.1)
EOS ABS: 0.2 10*3/uL (ref 0.0–0.5)
EOS%: 3.1 % (ref 0.0–7.0)
HCT: 29.2 % — ABNORMAL LOW (ref 34.8–46.6)
HEMOGLOBIN: 9.5 g/dL — AB (ref 11.6–15.9)
Immature Retic Fract: 6.9 % (ref 1.60–10.00)
LYMPH%: 20.4 % (ref 14.0–49.7)
MCH: 29.3 pg (ref 25.1–34.0)
MCHC: 32.5 g/dL (ref 31.5–36.0)
MCV: 90.1 fL (ref 79.5–101.0)
MONO#: 0.3 10*3/uL (ref 0.1–0.9)
MONO%: 4.8 % (ref 0.0–14.0)
NEUT%: 71.3 % (ref 38.4–76.8)
NEUTROS ABS: 4.8 10*3/uL (ref 1.5–6.5)
Platelets: 239 10*3/uL (ref 145–400)
RBC: 3.24 10*6/uL — ABNORMAL LOW (ref 3.70–5.45)
RDW: 12.9 % (ref 11.2–14.5)
Retic %: 1.57 % (ref 0.70–2.10)
Retic Ct Abs: 50.87 10*3/uL (ref 33.70–90.70)
WBC: 6.7 10*3/uL (ref 3.9–10.3)
lymph#: 1.4 10*3/uL (ref 0.9–3.3)

## 2016-11-29 LAB — TSH: TSH: 42.185 m(IU)/L — ABNORMAL HIGH (ref 0.308–3.960)

## 2016-11-29 MED ORDER — HEPARIN SOD (PORK) LOCK FLUSH 100 UNIT/ML IV SOLN
250.0000 [IU] | Freq: Once | INTRAVENOUS | Status: AC | PRN
  Administered 2016-11-29: 250 [IU]
  Filled 2016-11-29: qty 5

## 2016-11-29 MED ORDER — ACETAMINOPHEN 325 MG PO TABS
650.0000 mg | ORAL_TABLET | Freq: Once | ORAL | Status: AC
  Administered 2016-11-29: 650 mg via ORAL

## 2016-11-29 MED ORDER — DURVALUMAB 500 MG/10ML IV SOLN
9.5000 mg/kg | Freq: Once | INTRAVENOUS | Status: AC
  Administered 2016-11-29: 1120 mg via INTRAVENOUS
  Filled 2016-11-29: qty 20

## 2016-11-29 MED ORDER — DIPHENHYDRAMINE HCL 25 MG PO CAPS
ORAL_CAPSULE | ORAL | Status: AC
  Filled 2016-11-29: qty 2

## 2016-11-29 MED ORDER — SODIUM CHLORIDE 0.9 % IV SOLN
Freq: Once | INTRAVENOUS | Status: AC
  Administered 2016-11-29: 15:00:00 via INTRAVENOUS

## 2016-11-29 MED ORDER — ACETAMINOPHEN 325 MG PO TABS
ORAL_TABLET | ORAL | Status: AC
  Filled 2016-11-29: qty 2

## 2016-11-29 MED ORDER — SODIUM CHLORIDE 0.9% FLUSH
10.0000 mL | INTRAVENOUS | Status: DC | PRN
  Administered 2016-11-29: 10 mL
  Filled 2016-11-29: qty 10

## 2016-11-29 MED ORDER — SODIUM CHLORIDE 0.9 % IJ SOLN
10.0000 mL | INTRAMUSCULAR | Status: DC | PRN
  Administered 2016-11-29: 10 mL via INTRAVENOUS
  Filled 2016-11-29: qty 10

## 2016-11-29 MED ORDER — DIPHENHYDRAMINE HCL 25 MG PO TABS
25.0000 mg | ORAL_TABLET | Freq: Once | ORAL | Status: AC
  Administered 2016-11-29: 25 mg via ORAL
  Filled 2016-11-29: qty 1

## 2016-11-29 NOTE — Patient Instructions (Signed)
[facility] Discharge Instructions for Patients Receiving Chemotherapy  Today you received the following chemotherapy agents Duravalumab  To help prevent nausea and vomiting after your treatment, we encourage you to take your nausea medication as directed If you develop nausea and vomiting that is not controlled by your nausea medication, call the clinic.   BELOW ARE SYMPTOMS THAT SHOULD BE REPORTED IMMEDIATELY:  *FEVER GREATER THAN 100.5 F  *CHILLS WITH OR WITHOUT FEVER  NAUSEA AND VOMITING THAT IS NOT CONTROLLED WITH YOUR NAUSEA MEDICATION  *UNUSUAL SHORTNESS OF BREATH  *UNUSUAL BRUISING OR BLEEDING  TENDERNESS IN MOUTH AND THROAT WITH OR WITHOUT PRESENCE OF ULCERS  *URINARY PROBLEMS  *BOWEL PROBLEMS  UNUSUAL RASH Items with * indicate a potential emergency and should be followed up as soon as possible.  Feel free to call the clinic you have any questions or concerns. The clinic phone number is [phone].  Please show the [facility] at check-in to the Emergency Department and triage nurse.

## 2016-11-30 ENCOUNTER — Telehealth: Payer: Self-pay

## 2016-11-30 LAB — T4: Thyroxine (T4): 1.5 ug/dL — ABNORMAL LOW (ref 4.5–12.0)

## 2016-11-30 LAB — T3 UPTAKE
FREE THYROXINE INDEX: 0.2 — AB (ref 1.2–4.9)
T3 UPTAKE RATIO: 12 % — AB (ref 24–39)

## 2016-11-30 LAB — T4, FREE: T4,Free(Direct): 0.19 ng/dL — ABNORMAL LOW (ref 0.82–1.77)

## 2016-11-30 NOTE — Telephone Encounter (Signed)
Added 1 treatment in for 10/24.Per los

## 2016-11-30 NOTE — Telephone Encounter (Signed)
PT CBG yesterday 233. Discussed with Dr. Irene Limbo. Recommendation to f/u with PCP to manage. Possible elevation d/t multiple factors including stress and recent accident. Pt to see PCP on Thursday.

## 2016-11-30 NOTE — Telephone Encounter (Signed)
Called and spoke with patient concerning upcoming appointment for 10/24

## 2016-12-01 ENCOUNTER — Ambulatory Visit (HOSPITAL_BASED_OUTPATIENT_CLINIC_OR_DEPARTMENT_OTHER): Payer: Medicaid Other

## 2016-12-01 ENCOUNTER — Encounter: Payer: Self-pay | Admitting: Internal Medicine

## 2016-12-01 ENCOUNTER — Ambulatory Visit (INDEPENDENT_AMBULATORY_CARE_PROVIDER_SITE_OTHER): Payer: Medicaid Other | Admitting: Internal Medicine

## 2016-12-01 VITALS — BP 119/74 | HR 118 | Ht 69.5 in | Wt 275.2 lb

## 2016-12-01 DIAGNOSIS — R0602 Shortness of breath: Secondary | ICD-10-CM | POA: Diagnosis not present

## 2016-12-01 DIAGNOSIS — C792 Secondary malignant neoplasm of skin: Secondary | ICD-10-CM

## 2016-12-01 DIAGNOSIS — R000 Tachycardia, unspecified: Secondary | ICD-10-CM | POA: Diagnosis not present

## 2016-12-01 DIAGNOSIS — C3491 Malignant neoplasm of unspecified part of right bronchus or lung: Secondary | ICD-10-CM | POA: Diagnosis not present

## 2016-12-01 DIAGNOSIS — R079 Chest pain, unspecified: Secondary | ICD-10-CM | POA: Diagnosis not present

## 2016-12-01 DIAGNOSIS — I5021 Acute systolic (congestive) heart failure: Secondary | ICD-10-CM | POA: Insufficient documentation

## 2016-12-01 DIAGNOSIS — Z452 Encounter for adjustment and management of vascular access device: Secondary | ICD-10-CM

## 2016-12-01 DIAGNOSIS — C770 Secondary and unspecified malignant neoplasm of lymph nodes of head, face and neck: Secondary | ICD-10-CM

## 2016-12-01 MED ORDER — SODIUM CHLORIDE 0.9% FLUSH
10.0000 mL | Freq: Once | INTRAVENOUS | Status: AC
  Administered 2016-12-01: 10 mL
  Filled 2016-12-01: qty 10

## 2016-12-01 MED ORDER — CARVEDILOL 6.25 MG PO TABS: 6.2500 mg | ORAL_TABLET | Freq: Two times a day (BID) | ORAL | 3 refills | Status: DC

## 2016-12-01 MED ORDER — HEPARIN SOD (PORK) LOCK FLUSH 100 UNIT/ML IV SOLN
250.0000 [IU] | Freq: Once | INTRAVENOUS | Status: AC
  Administered 2016-12-01: 250 [IU]
  Filled 2016-12-01: qty 5

## 2016-12-01 NOTE — Progress Notes (Signed)
OFFICE CONSULT NOTE  Chief Complaint:  Follow-up studies  Primary Care Physician: Marilyn Drafts, FNP  HPI:  Marilyn [name] is a 51 y.o. female who is being seen today for the evaluation of tachycardia at the request of Marilyn Drafts, FNP. Marilyn [name] is a pleasant 51 yo female with a history of diagnosis of lung cancer in September 2017. At that time she underwent chemotherapy and radiation. She has since been deemed cancer free. She was noted to be tachycardic at the time and an echocardiogram was performed. This demonstrated moderate LVH, indeterminate diastolic function EF 29-19%. No significant valvular heart disease and no pericardial effusion was noted. Since then she's been persistently tachycardic with heart rates in the 100-120 range. She denies any worsening shortness of breath or chest pain. She is moderately obese. Blood pressure today is low 96/70. She was previously on metoprolol but that was discontinued as it worsened her asthma. She is now on diltiazem 180 mg for rate control. She has a number of prevascular risk factors including type 2 diabetes, obesity, hypercholesterolemia, hypertension and family history of heart failure in her mother. She is not a smoker. She also has recently diagnosed hypothyroidism with laboratory work that indicated a markedly elevated TSH of 43.4 and a low T4 of 3. Hemoglobin A1c was 9.1.  12/01/2016  [name] returns today for follow-up. She underwent an echocardiogram and nuclear stress test. Her echo shows a reduction in LV systolic function to 16-60%, from her previous echo in September. A nuclear stress test performed which shows no ischemia. I suspect that the degree of cardiomyopathy is responsible for her tachycardia. She is being treated for hypothyroidism and has had an elevated TSH and low T4. Her oncologist is been increasing her levothyroxine which is now 125 g daily. Free T4 remains suppressed at 0.19. As well as T3 uptake and  TSH is still elevated at 42. Recently she been on metoprolol, but says that she thinks it worsened her asthma. Interestingly she's not on any asthma medications and had a childhood asthma. She reported the worsening of her "asthma" was somewhat of a nonproductive cough and shortness of breath when walking up stairs. Not certain that this is the medicine. We discussed treatment of cardiomyopathy today and the fact that she is also diabetic and would benefit from both a beta blocker and ACE inhibitor or ARB.  PMHx:  Past Medical History:  Diagnosis Date  . Asthma   . Diabetes mellitus   . Hypercholesteremia   . Hypertension   . Lung cancer ([place])   . Neuropathy   . Obese     Past Surgical History:  Procedure Laterality Date  . CESAREAN SECTION    . IR GENERIC HISTORICAL  01/08/2016   IR US GUIDE VASC ACCESS RIGHT 01/08/2016 [name], DO [facility]  . IR GENERIC HISTORICAL  01/08/2016   IR FLUORO GUIDE CV LINE RIGHT 01/08/2016 [name], DO [facility]  . TUBAL LIGATION    . US guided core needle biopsy     of right lower neck/supraclavicular lymph nodes    FAMHx:  Family History  Problem Relation Age of Onset  . Heart failure Mother   . Hypertension Mother   . Cancer Neg Hx   . Rheumatologic disease Neg Hx     SOCHx:   reports that she has never smoked. She has never used smokeless tobacco. She reports that she does not drink alcohol or use drugs.  ALLERGIES:  No Known Allergies  ROS: Pertinent items noted in HPI and remainder of comprehensive ROS otherwise negative.  HOME MEDS: Current Outpatient Prescriptions on File Prior to Visit  Medication Sig Dispense Refill  . aspirin EC 81 MG tablet Take 1 tablet (81 mg total) by mouth daily. 60 tablet 2  . benzonatate (TESSALON) 100 MG capsule Take 1 capsule (100 mg total) by mouth 3 (three) times daily as needed for cough. 60 capsule 0  . DULoxetine (CYMBALTA) 60 MG capsule Take 1 capsule (60 mg total) by mouth daily.  30 capsule 2  . insulin aspart (NOVOLOG) 100 UNIT/ML FlexPen As per sliding scale instructions (Patient taking differently: Inject 0-20 Units into the skin 3 (three) times daily as needed for high blood sugar. As per sliding scale instructions) 15 mL 2  . Insulin Degludec (TRESIBA FLEXTOUCH) 200 UNIT/ML SOPN Inject 100 Units into the skin daily before breakfast.     . levothyroxine (SYNTHROID, LEVOTHROID) 125 MCG tablet Take 1 tablet (125 mcg total) by mouth daily before breakfast. 30 tablet 2  . loratadine (CLARITIN) 10 MG tablet Take 10 mg by mouth daily.    [name] oxyCODONE-acetaminophen (PERCOCET/ROXICET) 5-325 MG tablet Take 1-2 tablets by mouth every 6 (six) hours as needed for moderate pain or severe pain. 40 tablet 0  . polyethylene glycol (MIRALAX) packet Take 17 g by mouth daily. 30 each 1  . senna-docusate (SENNA S) 8.6-50 MG tablet Take 2 tablets by mouth 2 (two) times daily. May reduce to 2 tab po HS as needed once regular BM estabished 60 tablet 1  . [DISCONTINUED] insulin aspart protamine- aspart (NOVOLOG MIX 70/30) (70-30) 100 UNIT/ML injection Inject 0.6 mLs (60 Units total) into the skin 2 (two) times daily with a meal. (Patient not taking: Reported on 10/07/2014) 10 mL 12  . [DISCONTINUED] lisinopril (PRINIVIL,ZESTRIL) 20 MG tablet Take 0.5 tablets (10 mg total) by mouth daily. (Patient not taking: Reported on 10/07/2014) 30 tablet 0  . [DISCONTINUED] prochlorperazine (COMPAZINE) 10 MG tablet Take 1 tablet (10 mg total) by mouth every 6 (six) hours as needed (Nausea or vomiting). 30 tablet 1   Current Facility-Administered Medications on File Prior to Visit  Medication Dose Route Frequency Provider Last Rate Last Dose  . sodium chloride 0.9 % injection 10 mL  10 mL Intravenous PRN [name], MD   10 mL at 05/24/16 1252    LABS/IMAGING: Results for orders placed or performed in visit on 11/29/16 (from the past 48 hour(s))  CBC & Diff and Retic     Status: Abnormal   Collection  Time: 11/29/16 11:36 AM  Result Value Ref Range   WBC 6.7 3.9 - 10.3 10e3/uL   NEUT# 4.8 1.5 - 6.5 10e3/uL   HGB 9.5 (L) 11.6 - 15.9 g/dL   HCT 29.2 (L) 34.8 - 46.6 %   Platelets 239 145 - 400 10e3/uL   MCV 90.1 79.5 - 101.0 fL   MCH 29.3 25.1 - 34.0 pg   MCHC 32.5 31.5 - 36.0 g/dL   RBC 3.24 (L) 3.70 - 5.45 10e6/uL   RDW 12.9 11.2 - 14.5 %   lymph# 1.4 0.9 - 3.3 10e3/uL   MONO# 0.3 0.1 - 0.9 10e3/uL   Eosinophils Absolute 0.2 0.0 - 0.5 10e3/uL   Basophils Absolute 0.0 0.0 - 0.1 10e3/uL   NEUT% 71.3 38.4 - 76.8 %   LYMPH% 20.4 14.0 - 49.7 %   MONO% 4.8 0.0 - 14.0 %   EOS% 3.1 0.0 -  7.0 %   BASO% 0.4 0.0 - 2.0 %   Retic % 1.57 0.70 - 2.10 %   Retic Ct Abs 50.87 33.70 - 90.70 10e3/uL   Immature Retic Fract 6.90 1.60 - 10.00 %  TSH     Status: Abnormal   Collection Time: 11/29/16 11:36 AM  Result Value Ref Range   TSH 42.185 (H) 0.308 - 3.960 m(IU)/L  T3 uptake     Status: Abnormal   Collection Time: 11/29/16 11:36 AM  Result Value Ref Range   T3 Uptake Ratio 12 (L) 24 - 39 %   Free Thyroxine Index 0.2 (L) 1.2 - 4.9  T4     Status: Abnormal   Collection Time: 11/29/16 11:36 AM  Result Value Ref Range   Thyroxine (T4) 1.5 (L) 4.5 - 12.0 ug/dL  T4, free     Status: Abnormal   Collection Time: 11/29/16 11:36 AM  Result Value Ref Range   T4,Free(Direct) 0.19 (L) 0.82 - 1.77 ng/dL  Comprehensive metabolic panel     Status: Abnormal   Collection Time: 11/29/16 11:37 AM  Result Value Ref Range   Sodium 139 136 - 145 mEq/L   Potassium 3.9 3.5 - 5.1 mEq/L   Chloride 101 98 - 109 mEq/L   CO2 29 22 - 29 mEq/L   Glucose 233 (H) 70 - 140 mg/dl    Comment: Glucose reference range is for nonfasting patients. Fasting glucose reference range is 70- 100.   BUN 10.6 7.0 - 26.0 mg/dL   Creatinine 0.9 0.6 - 1.1 mg/dL   Total Bilirubin 0.34 0.20 - 1.20 mg/dL   Alkaline Phosphatase 87 40 - 150 U/L   AST 20 5 - 34 U/L   ALT 11 0 - 55 U/L   Total Protein 7.8 6.4 - 8.3 g/dL   Albumin 3.3  (L) 3.5 - 5.0 g/dL   Calcium 9.8 8.4 - 10.4 mg/dL   Anion Gap 9 3 - 11 mEq/L   EGFR >90 >90 ml/min/1.73 m2    Comment: eGFR is calculated using the CKD-EPI Creatinine Equation (2009)   No results found.  LIPID PANEL: No results found for: CHOL, TRIG, HDL, CHOLHDL, VLDL, LDLCALC, LDLDIRECT  WEIGHTS: Wt Readings from Last 3 Encounters:  12/01/16 275 lb 3.2 oz (124.8 kg)  11/29/16 277 lb 9.6 oz (125.9 kg)  11/24/16 275 lb (124.7 kg)    VITALS: BP 119/74   Pulse (!) 118   Ht 5' 9.5" (1.765 m)   Wt 275 lb 3.2 oz (124.8 kg)   BMI 40.06 kg/m   EXAM: Deferred  EKG: Deferred  ASSESSMENT: 1. Sinus tachycardia - possibly related to CHF vs. Iatrogenic thyroid replacement 2. Recent lung cancer status post radiation chemotherapy 3. Morbid obesity 4. Type 2 diabetes 5. Hypertension 6. Dyslipidemia  PLAN: 1.   Mrs. Cifelli has a cardiomyopathy on echo with EF 45-50%. She remains in a sinus tachycardia. Diltiazem is not affected her rate. Given her new cardiomyopathy I recommend starting carvedilol 6.125 mg daily and will discontinue diltiazem. We'll have to follow blood pressure to see if there is room to add an ACE inhibitor or ARB which would be guideline recommended given her diabetes and heart failure. The etiology of this is not clear however she is on a maintenance monoclonal antibody chemotherapy called Durvalumab, which could possibly cause myocarditis is a listed serious reaction, but I don't believe is typically associated with cardiomyopathy. Also of interest, despite aggressive treatment of her thyroid, her TSH remains elevated  and T4 and T3 levels are suppressed. She is been recently increased to very high dose of levothyroxine 125 g daily. I'm concerned that possibly her thyroid levels are spurious and that she has not had significant improvement with treatment. I wonder if she may be iatrogenically over treated with levothyroxine causing her tachycardia. I would highly  recommend an endocrine evaluation at this point to determine the etiology of her hypothyroidism and as to why she may be resistant to treatment.  Follow-up with me in one month.  Pixie Casino, MD, [place]  Attending Cardiologist  Direct Dial: [phone]  Fax: [phone]  Website:  [name] 12/01/2016, 8:45 AM

## 2016-12-01 NOTE — Patient Instructions (Signed)
Medication Instructions:  STOP diltiazem   START carvedilol (Coreg) 6.25 two times daily-a new prescription has been sent to your pharmacy.  Follow-Up: Your physician recommends that you schedule a follow-up appointment in: 1 MONTH with Dr. Debara Pickett.    Any Other Special Instructions Will Be Listed Below (If Applicable).     If you need a refill on your cardiac medications before your next appointment, please call your pharmacy.

## 2016-12-02 DIAGNOSIS — C792 Secondary malignant neoplasm of skin: Secondary | ICD-10-CM | POA: Insufficient documentation

## 2016-12-02 DIAGNOSIS — C3491 Malignant neoplasm of unspecified part of right bronchus or lung: Secondary | ICD-10-CM | POA: Diagnosis not present

## 2016-12-06 NOTE — Progress Notes (Signed)
[name]    HEMATOLOGY/ONCOLOGY CLINIC NOTE  Date of Service: .11/29/2016  Patient Care Team: [name], FNP as PCP - General (Nurse Practitioner)  CHIEF COMPLAINTS/PURPOSE OF CONSULTATION:  f/u Lung Adenocarcinoma.  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  Current treatment: -Started Maintainence Durvalumab from  05/24/2016 q2weeks  PreviousTreatment -Concurrent Chemo-radiation with Carboplatin/Taxol. Completed RT on 02/13/2016 and has then completed 2 cycles of carboplatin + taxol.  HISTORY OF PRESENTING ILLNESS: Plz see my previous note for details on initial presentation.  INTERVAL HISTORY  Patient is here for her followup of her lung cancer. Her CT chest abdomen pelvis were again reviewed with her and so was the MRI of the brain. MRI of the brain showed no metastases in the brain. She seems to have isolated right subcutaneous flank metastasis. Has been evaluated by radiation oncology and has had CT simulation for palliative radiation to this painful right flank mass. We discussed switching to Nivolumab for ongoing systemic therapies for metastatic lung adenocarcinoma. Notes that her fatigue is somewhat improved.   MEDICAL HISTORY:  Past Medical History:  Diagnosis Date  . Asthma   . Diabetes mellitus   . Hypercholesteremia   . Hypertension   . Lung cancer ([place])   . Neuropathy   . Obese     SURGICAL HISTORY: Past Surgical History:  Procedure Laterality Date  . CESAREAN SECTION    . IR GENERIC HISTORICAL  01/08/2016   IR US GUIDE VASC ACCESS RIGHT 01/08/2016 [name], DO [facility]  . IR GENERIC HISTORICAL  01/08/2016   IR FLUORO GUIDE CV LINE RIGHT 01/08/2016 [name], DO [facility]  . TUBAL LIGATION    . US guided core needle biopsy     of right lower neck/supraclavicular lymph nodes    SOCIAL HISTORY: Social History   Social History  . Marital status: Divorced    Spouse name: N/A  . Number of  children: N/A  . Years of education: N/A   Occupational History  . Not on file.   Social History Main Topics  . Smoking status: Never Smoker  . Smokeless tobacco: Never Used  . Alcohol use No  . Drug use: No  . Sexual activity: Not Currently   Other Topics Concern  . Not on file   Social History Narrative   No bird or mold exposure. No recent travel.    FAMILY HISTORY: Family History  Problem Relation Age of Onset  . Heart failure Mother   . Hypertension Mother   . Cancer Neg Hx   . Rheumatologic disease Neg Hx     ALLERGIES:  has No Known Allergies.  MEDICATIONS:  Current Outpatient Prescriptions  Medication Sig Dispense Refill  . aspirin EC 81 MG tablet Take 1 tablet (81 mg total) by mouth daily. 60 tablet 2  . benzonatate (TESSALON) 100 MG capsule Take 1 capsule (100 mg total) by mouth 3 (three) times daily as needed for cough. 60 capsule 0  . carvedilol (COREG) 6.25 MG tablet Take 1 tablet (6.25 mg total) by mouth 2 (two) times daily. 180 tablet 3  . DULoxetine (CYMBALTA) 60 MG capsule Take 1 capsule (60 mg total) by mouth daily. 30 capsule 2  . insulin aspart (NOVOLOG) 100 UNIT/ML FlexPen As per sliding scale instructions (Patient taking differently: Inject 0-20 Units into the skin 3 (three) times daily as needed for high blood sugar. As per sliding scale instructions) 15 mL 2  . Insulin Degludec (TRESIBA FLEXTOUCH)  200 UNIT/ML SOPN Inject 100 Units into the skin daily before breakfast.     . levothyroxine (SYNTHROID, LEVOTHROID) 125 MCG tablet Take 1 tablet (125 mcg total) by mouth daily before breakfast. 30 tablet 2  . loratadine (CLARITIN) 10 MG tablet Take 10 mg by mouth daily.    [name] oxyCODONE-acetaminophen (PERCOCET/ROXICET) 5-325 MG tablet Take 1-2 tablets by mouth every 6 (six) hours as needed for moderate pain or severe pain. 40 tablet 0  . polyethylene glycol (MIRALAX) packet Take 17 g by mouth daily. 30 each 1  . senna-docusate (SENNA S) 8.6-50 MG tablet Take  2 tablets by mouth 2 (two) times daily. May reduce to 2 tab po HS as needed once regular BM estabished 60 tablet 1   No current facility-administered medications for this visit.    Facility-Administered Medications Ordered in Other Visits  Medication Dose Route Frequency Provider Last Rate Last Dose  . sodium chloride 0.9 % injection 10 mL  10 mL Intravenous PRN [name], MD   10 mL at 05/24/16 1252    REVIEW OF SYSTEMS:    10 Point review of Systems was done is negative except as noted above.  PHYSICAL EXAMINATION: ECOG PERFORMANCE STATUS: 1 - Symptomatic but completely ambulatory  . Vitals:   11/29/16 1352  BP: 113/81  Pulse: (!) 117  Resp: 18  Temp: 98.2 F (36.8 C)  SpO2: 99%   Filed Weights   11/29/16 1352  Weight: 277 lb 9.6 oz (125.9 kg)   .Body mass index is 40.41 kg/m.  GENERAL:alert, in no acute distress and comfortable SKIN: radiation related skin injury over neck - healing EYES: normal, conjunctiva are pink and non-injected, sclera clear OROPHARYNX:oral thrush noted NECK: supple, no JVD, thyroid normal size, non-tender, without nodularity LYMPH:  no palpable lymphadenopathy in the cervical, axillary or inguinal.             LUNGS: clear to auscultation with normal respiratory effort. Distant breath sounds  HEART: regular rate & rhythm,  no murmurs and no lower extremity edema ABDOMEN: abdomen obese, soft, non-tender, normoactive bowel sounds  Musculoskeletal: no cyanosis of digits and no clubbing  PSYCH: alert & oriented x 3 with fluent speech NEURO: no focal motor/sensory deficits  LABORATORY DATA: .  [name] CBC Latest Ref Rng & Units 11/29/2016 11/15/2016 11/10/2016  WBC 3.9 - 10.3 10e3/uL 6.7 5.5 8.0  Hemoglobin 11.6 - 15.9 g/dL 9.5(L) 9.7(L) 10.5(L)  Hematocrit 34.8 - 46.6 % 29.2(L) 29.7(L) 31.6(L)  Platelets 145 - 400 10e3/uL 239 267 317   . CMP Latest Ref Rng & Units 11/29/2016 11/15/2016 11/01/2016  Glucose 70 - 140 mg/dl 233(H) 155(H)  242(H)  BUN 7.0 - 26.0 mg/dL 10.6 9.7 9.8  Creatinine 0.6 - 1.1 mg/dL 0.9 0.8 0.9  Sodium 136 - 145 mEq/L 139 139 136  Potassium 3.5 - 5.1 mEq/L 3.9 3.8 3.8  Chloride 101 - 111 mmol/L - - -  CO2 22 - 29 mEq/L 29 31(H) 28  Calcium 8.4 - 10.4 mg/dL 9.8 8.7 8.7  Total Protein 6.4 - 8.3 g/dL 7.8 7.4 7.2  Total Bilirubin 0.20 - 1.20 mg/dL 0.34 0.33 0.26  Alkaline Phos 40 - 150 U/L 87 85 81  AST 5 - 34 U/L '20 14 15  '$ ALT 0 - 55 U/L '11 9 9                '$ RADIOGRAPHIC STUDIES:  .Mr Jeri Cos Wo Contrast  Result Date: 11/19/2016 CLINICAL DATA:  Non-small-cell lung  cancer staging. EXAM: MRI HEAD WITHOUT AND WITH CONTRAST TECHNIQUE: Multiplanar, multiecho pulse sequences of the brain and surrounding structures were obtained without and with intravenous contrast. CONTRAST:  52m MULTIHANCE GADOBENATE DIMEGLUMINE 529 MG/ML IV SOLN COMPARISON:  Brain MRI 12/26/2015 FINDINGS: Brain: The midline structures are normal. There is no focal diffusion restriction to indicate acute infarct. Scattered white matter hyperintensities, slightly greater than expected at this age. No intraparenchymal hematoma or chronic microhemorrhage. Brain volume is normal for age without lobar predominant atrophy. The dura is normal and there is no extra-axial collection. Vascular: Major intracranial arterial and venous sinus flow voids are preserved. Skull and upper cervical spine: The visualized skull base, calvarium, upper cervical spine and extracranial soft tissues are normal. Sinuses/Orbits: No fluid levels or advanced mucosal thickening. No mastoid or middle ear effusion. Normal orbits. IMPRESSION: No intracranial or calvarial metastatic disease. Electronically Signed   By: KUlyses JarredM.D.   On: 11/19/2016 17:26   [place]opsy  Result Date: 11/10/2016 INDICATION: 51year old female with a history of right flank mass concerning for metastatic lung carcinoma EXAM: ULTRASOUND-GUIDED BIOPSY RIGHT FLANK MASS MEDICATIONS:  None. ANESTHESIA/SEDATION: Moderate (conscious) sedation was employed during this procedure. A total of Versed 1.0 mg and Fentanyl 50 mcg was administered intravenously. Moderate Sedation Time: 10 minutes. The patient's level of consciousness and vital signs were monitored continuously by radiology nursing throughout the procedure under my direct supervision. FLUOROSCOPY TIME:  Ultrasound COMPLICATIONS: None PROCEDURE: Informed written consent was obtained from the patient after a thorough discussion of the procedural risks, benefits and alternatives. All questions were addressed. Maximal Sterile Barrier Technique was utilized including caps, mask, sterile gowns, sterile gloves, sterile drape, hand hygiene and skin antiseptic. A timeout was performed prior to the initiation of the procedure. Patient positioned supine position on the ultrasound gantry. Images of the right flank were performed with images stored and sent to PACs. The patient was then prepped and draped in the usual sterile fashion. The skin and subcutaneous tissues were generously infiltrated 1% lidocaine for local anesthesia. A small stab incision was made with 11 blade scalpel. Using ultrasound guidance, multiple 18 gauge core biopsy were acquired and placed into formalin solution. Final image was stored and a sterile bandage was placed. Patient tolerated the procedure well and remained hemodynamically stable throughout. No complications were encountered and no significant blood loss. IMPRESSION: Status post ultrasound-guided biopsy of right-sided flank mass with multiple 18 gauge core biopsy. Tissue specimen sent to pathology for complete histopathologic analysis. Signed, JDulcy Fanny WEarleen Newport DO [facility] GNorth [facility] CenterRadiology Electronically Signed   By: JCorrie MckusickD.O.   On: 11/10/2016 13:39     ASSESSMENT & PLAN:   51year old African-American female with history of hypertension, diabetes, asthma,  dysuria mass and morbid obesity with   #1 h/o Lung Adenocarcinoma Rt sided atleast Stage IIIB (on diagnosis) with large right paratracheal mass with mediastinal adenopathy that appears to have grown significantly over the last 6-7 months and rt supraclavicular LN +.   Noted to have a small mass in the left adrenal on CT but PET/CT neg for metastatic disease. Patient has been a lifelong nonsmoker  MRI of the brain was negative for any metastatic disease. At diagnosis.  High PDL1 expression (90%) on [facility] One Neg for EGFR, ALK, ROS-1 and BRAF mutations.  Patient completed her planned definitive chemoradiation with carbo Taxol on 02/13/2016. No prohibitive toxicities other than some grade 1 skin desquamation and some grade 1-2 radiation esophagitis.  She has subsequently received 2 out of 2 planned dose of carboplatin + Taxol. Patient has completed 11 doses of maintenance  Durvalumab will receive the 12th dose today  CTA chest 10/29/2016  no evidence of lung cancer progression in the chest. No PE CT abd/pelvis-10/29/2016  Interval 5.0 x 5.0 x 4.7 cm mass in the right lateral subcutaneous fat at the level of the mid abdomen. This has CT features compatible with a metastasis or primary neoplasm.  #2 Now with Metastatic lung adenocarcinoma with isolated metastases in the right lateral subcutaneous fat at the level of the midabdomen. Plan -Will continue proceed with her next treatment with Durvalumab today and then switched to Nivolumab in the setting of Biopsy proven metastatic disease. Given the tumor is strongly PDL1 positive and patient has only isolated metastatic disease will continue immunotherapy at this time in the absence of more significant evidence of disease progression. - Continue when necessary Percocet for pain management  -She has seen radiation oncology and she'll be proceeding with palliative radiation to her metastatic subcutaneous mass. This might also help with tumor antigen  discovery and better response to Immunotherapy.  #2 h/o radiation pneumonitis . #3 Improving fatigue (grade 1)  -working with PT once weekly and encourage to walk as much as possible  - optimize DM2 management  - Mx of newly diagnosed hypothyroidism. - patient not taking levothyroxine as instructed and notes that she will take this appropriate now. Continue Levolthyroxine 146mg ---will likely need larger dose but in the setting of baseline tachycardia we are gradually increasing dose to avoid ppt cardiac arrhythmias.  #4 normocytic anemia related to chemotherapy -resolving. hgb 9.6 #5 Left chest wall pain due to costochondritis - mx with pain medications. CTA x 2 neg for PE   #6 Sinus tachycardia - on Cardizem CR , no PE on CTA x 2 Plan -Still having sinus tachycardia . Partly from deconditioning and anemia. Seeing Dr. HDebara Pickettfrom cardiology  -continue Cardizem CR '180mg'$  po daily -maintain good po hydration.  #7 neoplasm related pain is currently controlled without significant medications. Clinically likely has sleep apnea . Would need to be careful with sedative medications . Plan -recommend sleep study with PCP  #8 hypertension  diabetes -uncontrolled  Dyslipidemia  Asthma Plan - insulin SS for uncontrolled hyperglycemia in the setting of steroid use with chemotherapy. 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 No focal motor deficits. Given concern for metastatic lesion and abdominal wall would also rule out metastatic disease to the brain in the setting of her headaches. -MRI Brain w and wo contrast done - no evidence of metastatic disease -- headache now resolved -- likely tension headache.  RTC with Dr KIrene Limboin 2 weeks with labs. Please next 2  treatments q2weeks ([place]   All of the patients questions were answered with apparent satisfaction. The patient knows to call the clinic with any problems, questions or concerns.  I spent 20 minutes counseling the patient face to face. The total time spent in the appointment was 30 minutes and more than 50% was on counseling and direct patient cares.    GSullivan LoneMD MShenandoahAAHIVMS SAdvanced Pain Surgical Center IncCDesert Peaks Surgery CenterHematology/Oncology Physician [place]Bridge[place] (Office):  [phone] (Work cell):  [phone] (Fax):           [phone]

## 2016-12-07 ENCOUNTER — Telehealth: Payer: Self-pay

## 2016-12-07 ENCOUNTER — Ambulatory Visit
Admission: RE | Admit: 2016-12-07 | Discharge: 2016-12-07 | Disposition: A | Discharge status: Home / Self Care | Payer: Medicaid Other | Source: Ambulatory Visit | Attending: Radiation Oncology | Admitting: Radiation Oncology

## 2016-12-07 DIAGNOSIS — C3491 Malignant neoplasm of unspecified part of right bronchus or lung: Secondary | ICD-10-CM | POA: Diagnosis not present

## 2016-12-07 NOTE — Telephone Encounter (Signed)
Attempt to contact office at [place]. Was transferred and left on hold for three minutes. Unable to wait. Will call back tomorrow to verify patient has referral and gather documentation if required prior to scheduling pt with Dr. Elayne Snare.

## 2016-12-08 ENCOUNTER — Ambulatory Visit
Admission: RE | Admit: 2016-12-08 | Discharge: 2016-12-08 | Disposition: A | Discharge status: Home / Self Care | Payer: Medicaid Other | Source: Ambulatory Visit | Attending: Radiation Oncology | Admitting: Radiation Oncology

## 2016-12-08 ENCOUNTER — Telehealth: Payer: Self-pay

## 2016-12-08 DIAGNOSIS — C3491 Malignant neoplasm of unspecified part of right bronchus or lung: Secondary | ICD-10-CM | POA: Diagnosis not present

## 2016-12-08 NOTE — Telephone Encounter (Signed)
Staff message sent to f/u on pt referral for endocrinology with Dr. Elayne Snare.

## 2016-12-09 ENCOUNTER — Ambulatory Visit
Admission: RE | Admit: 2016-12-09 | Discharge: 2016-12-09 | Disposition: A | Discharge status: Home / Self Care | Payer: Medicaid Other | Source: Ambulatory Visit | Attending: Radiation Oncology | Admitting: Radiation Oncology

## 2016-12-09 DIAGNOSIS — C3491 Malignant neoplasm of unspecified part of right bronchus or lung: Secondary | ICD-10-CM | POA: Diagnosis not present

## 2016-12-09 IMAGING — PT NM PET TUM IMG INITIAL (PI) SKULL BASE T - THIGH
1 of 8 series · 1 of 25 positions shown · non-contrast
Comparison: 12/26/2015 chest CT angiogram. 12/27/2015 CT abdomen/
pelvis.

CLINICAL DATA: Initial treatment strategy for metastatic
non-small-cell carcinoma diagnosed on right supraclavicular lymph
node biopsy on 12/29/2015 (consistent with primary lung
adenocarcinoma on pathology report).

EXAM:
NUCLEAR MEDICINE PET SKULL BASE TO THIGH
TECHNIQUE: 14.2 mCi F-18 FDG was injected intravenously. Full-ring PET imaging
was performed from the skull base to thigh after the radiotracer. CT
data was obtained and used for attenuation correction and anatomic
localization.
FASTING BLOOD GLUCOSE:  Value: 166 mg/dl

[Series 4: ct sk_thigh 5.0 b31f · axial · 5.0mm · 0.98mm/px · 1 of 228 slices shown]
[im 114/228  brain]
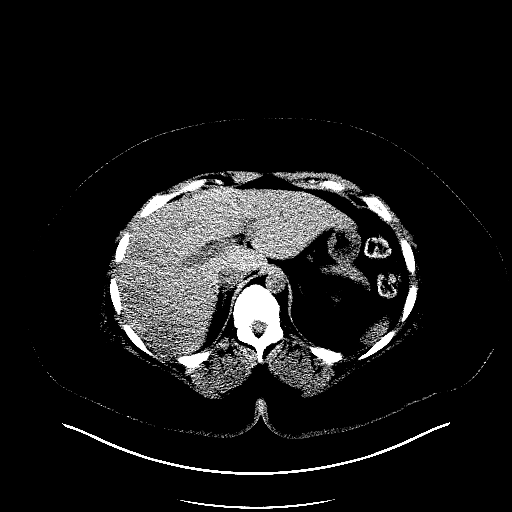

[1 of 25 positions shown; findings below may reference images not displayed]

FINDINGS: NECK

There are several hypermetabolic lymph nodes in the right lower neck
involving nodal levels 3, 4 and 5, with representative nodes as
follows:

- 1.5 cm right level 3 node with max SUV 12.3 (series 4/image 47)

- 2.3 cm right level 4 node with max SUV 16.9 (series 4/image 54)

- 1.7 cm right level 5 node with max SUV 14.1 (series 4/image 52)

No hypermetabolic left neck lymph nodes.

CHEST

Hypermetabolic 1.7 cm peripheral right hilar nodule with max SUV
22.1 (series 4/image 83).

Centrally necrotic bulky hypermetabolic 3.9 cm right paratracheal
node with max SUV 16.3 (series 4/image 71).

Bilateral hypermetabolic scalene adenopathy measuring 1.3 cm on the
right with max SUV 12.0 (series 4/image 59) and 1.3 cm on the left
with max SUV 13.3 (series 4/image 59).

Bilateral upper mediastinal prevascular hypermetabolic adenopathy
measuring 1.6 cm on the right with max SUV 11.9 (series 4/image 72)
and 1.0 cm on the left with max SUV 13.1 (series 4/image 68).

Right PICC terminates at the cavoatrial junction. Mildly
atherosclerotic nonaneurysmal thoracic aorta. No pleural effusions.
No acute consolidative airspace disease, lung masses or significant
pulmonary nodules.

ABDOMEN/PELVIS

No abnormal hypermetabolic activity within the liver, pancreas, or
spleen. No hypermetabolic lymph nodes in the abdomen or pelvis.
There is mild thickening of the left adrenal gland up to 0.6 cm
thickness (series 4/image 123), which is not considered a true left
adrenal nodule at this time. There is no hypermetabolism within the
adrenal glands.

SKELETON

No focal hypermetabolic activity to suggest skeletal metastasis.
IMPRESSION: 1. Hypermetabolic 1.7 cm peripheral right hilar nodule. Given the
peripheral location of this nodule within the right hilum, this
could potentially represent a centrally located primary bronchogenic
malignancy.
2. Hypermetabolic bulky ipsilateral paratracheal, bilateral
prevascular mediastinal and bilateral scalene nodal metastases.
3. Hypermetabolic right lower neck nodal metastases involving right
neck nodal levels III, IV and V.
4. No hypermetabolic metastatic disease in the abdomen, pelvis or
skeleton.
5. Aortic atherosclerosis.

## 2016-12-10 ENCOUNTER — Ambulatory Visit
Admission: RE | Admit: 2016-12-10 | Discharge: 2016-12-10 | Disposition: A | Discharge status: Home / Self Care | Payer: Medicaid Other | Source: Ambulatory Visit | Attending: Radiation Oncology | Admitting: Radiation Oncology

## 2016-12-10 DIAGNOSIS — C3491 Malignant neoplasm of unspecified part of right bronchus or lung: Secondary | ICD-10-CM | POA: Diagnosis not present

## 2016-12-13 ENCOUNTER — Ambulatory Visit
Admission: RE | Admit: 2016-12-13 | Discharge: 2016-12-13 | Disposition: A | Discharge status: Home / Self Care | Payer: Medicaid Other | Source: Ambulatory Visit | Attending: Radiation Oncology | Admitting: Radiation Oncology

## 2016-12-13 ENCOUNTER — Ambulatory Visit: Payer: Medicaid Other

## 2016-12-13 ENCOUNTER — Ambulatory Visit (HOSPITAL_BASED_OUTPATIENT_CLINIC_OR_DEPARTMENT_OTHER): Payer: Medicaid Other

## 2016-12-13 ENCOUNTER — Other Ambulatory Visit (HOSPITAL_BASED_OUTPATIENT_CLINIC_OR_DEPARTMENT_OTHER): Payer: Medicaid Other

## 2016-12-13 VITALS — BP 131/83 | HR 110 | Temp 98.0°F | Resp 18

## 2016-12-13 DIAGNOSIS — C792 Secondary malignant neoplasm of skin: Secondary | ICD-10-CM

## 2016-12-13 DIAGNOSIS — Z452 Encounter for adjustment and management of vascular access device: Secondary | ICD-10-CM

## 2016-12-13 DIAGNOSIS — Z95828 Presence of other vascular implants and grafts: Secondary | ICD-10-CM

## 2016-12-13 DIAGNOSIS — C770 Secondary and unspecified malignant neoplasm of lymph nodes of head, face and neck: Secondary | ICD-10-CM

## 2016-12-13 DIAGNOSIS — C3491 Malignant neoplasm of unspecified part of right bronchus or lung: Secondary | ICD-10-CM

## 2016-12-13 DIAGNOSIS — Z5112 Encounter for antineoplastic immunotherapy: Secondary | ICD-10-CM | POA: Diagnosis not present

## 2016-12-13 LAB — CBC & DIFF AND RETIC
BASO%: 0.3 % (ref 0.0–2.0)
BASOS ABS: 0 10*3/uL (ref 0.0–0.1)
EOS%: 2.7 % (ref 0.0–7.0)
Eosinophils Absolute: 0.2 10*3/uL (ref 0.0–0.5)
HEMATOCRIT: 28.9 % — AB (ref 34.8–46.6)
HGB: 9.4 g/dL — ABNORMAL LOW (ref 11.6–15.9)
IMMATURE RETIC FRACT: 6.1 % (ref 1.60–10.00)
LYMPH#: 1.2 10*3/uL (ref 0.9–3.3)
LYMPH%: 17.2 % (ref 14.0–49.7)
MCH: 29.4 pg (ref 25.1–34.0)
MCHC: 32.5 g/dL (ref 31.5–36.0)
MCV: 90.3 fL (ref 79.5–101.0)
MONO#: 0.3 10*3/uL (ref 0.1–0.9)
MONO%: 4.2 % (ref 0.0–14.0)
NEUT#: 5.3 10*3/uL (ref 1.5–6.5)
NEUT%: 75.6 % (ref 38.4–76.8)
PLATELETS: 311 10*3/uL (ref 145–400)
RBC: 3.2 10*6/uL — ABNORMAL LOW (ref 3.70–5.45)
RDW: 12.7 % (ref 11.2–14.5)
RETIC CT ABS: 61.44 10*3/uL (ref 33.70–90.70)
Retic %: 1.92 % (ref 0.70–2.10)
WBC: 7 10*3/uL (ref 3.9–10.3)

## 2016-12-13 LAB — COMPREHENSIVE METABOLIC PANEL
ALT: 6 U/L (ref 0–55)
ANION GAP: 9 meq/L (ref 3–11)
AST: 13 U/L (ref 5–34)
Albumin: 3.2 g/dL — ABNORMAL LOW (ref 3.5–5.0)
Alkaline Phosphatase: 86 U/L (ref 40–150)
BILIRUBIN TOTAL: 0.3 mg/dL (ref 0.20–1.20)
BUN: 6.5 mg/dL — ABNORMAL LOW (ref 7.0–26.0)
CO2: 28 mEq/L (ref 22–29)
CREATININE: 0.9 mg/dL (ref 0.6–1.1)
Calcium: 9.4 mg/dL (ref 8.4–10.4)
Chloride: 99 mEq/L (ref 98–109)
Glucose: 271 mg/dl — ABNORMAL HIGH (ref 70–140)
Potassium: 3.8 mEq/L (ref 3.5–5.1)
Sodium: 135 mEq/L — ABNORMAL LOW (ref 136–145)
TOTAL PROTEIN: 7.8 g/dL (ref 6.4–8.3)

## 2016-12-13 MED ORDER — SODIUM CHLORIDE 0.9% FLUSH
10.0000 mL | INTRAVENOUS | Status: DC | PRN
  Administered 2016-12-13: 10 mL
  Filled 2016-12-13: qty 10

## 2016-12-13 MED ORDER — SODIUM CHLORIDE 0.9 % IV SOLN
Freq: Once | INTRAVENOUS | Status: AC
  Administered 2016-12-13: 14:00:00 via INTRAVENOUS

## 2016-12-13 MED ORDER — HEPARIN SOD (PORK) LOCK FLUSH 100 UNIT/ML IV SOLN
500.0000 [IU] | Freq: Once | INTRAVENOUS | Status: AC | PRN
  Administered 2016-12-13: 500 [IU]
  Filled 2016-12-13: qty 5

## 2016-12-13 MED ORDER — SODIUM CHLORIDE 0.9 % IJ SOLN
10.0000 mL | INTRAMUSCULAR | Status: DC | PRN
  Administered 2016-12-13: 10 mL via INTRAVENOUS
  Filled 2016-12-13: qty 10

## 2016-12-13 MED ORDER — NIVOLUMAB CHEMO INJECTION 100 MG/10ML
240.0000 mg | Freq: Once | INTRAVENOUS | Status: AC
  Administered 2016-12-13: 240 mg via INTRAVENOUS
  Filled 2016-12-13: qty 24

## 2016-12-13 NOTE — Patient Instructions (Signed)
[place] Discharge Instructions for Patients Receiving Chemotherapy  Today you received the following chemotherapy agents Opdivo  To help prevent nausea and vomiting after your treatment, we encourage you to take your nausea medication as prescribed.   If you develop nausea and vomiting that is not controlled by your nausea medication, call the clinic.   BELOW ARE SYMPTOMS THAT SHOULD BE REPORTED IMMEDIATELY:  *FEVER GREATER THAN 100.5 F  *CHILLS WITH OR WITHOUT FEVER  NAUSEA AND VOMITING THAT IS NOT CONTROLLED WITH YOUR NAUSEA MEDICATION  *UNUSUAL SHORTNESS OF BREATH  *UNUSUAL BRUISING OR BLEEDING  TENDERNESS IN MOUTH AND THROAT WITH OR WITHOUT PRESENCE OF ULCERS  *URINARY PROBLEMS  *BOWEL PROBLEMS  UNUSUAL RASH Items with * indicate a potential emergency and should be followed up as soon as possible.  Feel free to call the clinic you have any questions or concerns. The clinic phone number is [phone].  Please show the [facility] at check-in to the Emergency Department and triage nurse.  Nivolumab injection (Opdivo) What is this medicine? NIVOLUMAB (nye VOL ue mab) is a monoclonal antibody. It is used to treat melanoma, lung cancer, kidney cancer, head and neck cancer, Hodgkin lymphoma, urothelial cancer, colon cancer, and liver cancer. This medicine may be used for other purposes; ask your health care provider or pharmacist if you have questions. COMMON BRAND NAME(S): Opdivo What should I tell my health care provider before I take this medicine? They need to know if you have any of these conditions: -diabetes -immune system problems -kidney disease -liver disease -lung disease -organ transplant -stomach or intestine problems -thyroid disease -an unusual or allergic reaction to nivolumab, other medicines, foods, dyes, or preservatives -pregnant or trying to get pregnant -breast-feeding How should I use this medicine? This medicine is  for infusion into a vein. It is given by a health care professional in a hospital or clinic setting. A special MedGuide will be given to you before each treatment. Be sure to read this information carefully each time. Talk to your pediatrician regarding the use of this medicine in children. While this drug may be prescribed for children as young as 12 years for selected conditions, precautions do apply. Overdosage: If you think you have taken too much of this medicine contact a poison control center or emergency room at once. NOTE: This medicine is only for you. Do not share this medicine with others. What if I miss a dose? It is important not to miss your dose. Call your doctor or health care professional if you are unable to keep an appointment. What may interact with this medicine? Interactions have not been studied. Give your health care provider a list of all the medicines, herbs, non-prescription drugs, or dietary supplements you use. Also tell them if you smoke, drink alcohol, or use illegal drugs. Some items may interact with your medicine. This list may not describe all possible interactions. Give your health care provider a list of all the medicines, herbs, non-prescription drugs, or dietary supplements you use. Also tell them if you smoke, drink alcohol, or use illegal drugs. Some items may interact with your medicine. What should I watch for while using this medicine? This drug may make you feel generally unwell. Continue your course of treatment even though you feel ill unless your doctor tells you to stop. You may need blood work done while you are taking this medicine. Do not become pregnant while taking this medicine or for 5 months after stopping  it. Women should inform their doctor if they wish to become pregnant or think they might be pregnant. There is a potential for serious side effects to an unborn child. Talk to your health care professional or pharmacist for more information.  Do not breast-feed an infant while taking this medicine. What side effects may I notice from receiving this medicine? Side effects that you should report to your doctor or health care professional as soon as possible: -allergic reactions like skin rash, itching or hives, swelling of the face, lips, or tongue -black, tarry stools -blood in the urine -bloody or watery diarrhea -changes in vision -change in sex drive -changes in emotions or moods -chest pain -confusion -cough -decreased appetite -diarrhea -facial flushing -feeling faint or lightheaded -fever, chills -hair loss -hallucination, loss of contact with reality -headache -irritable -joint pain -loss of memory -muscle pain -muscle weakness -seizures -shortness of breath -signs and symptoms of high blood sugar such as dizziness; dry mouth; dry skin; fruity breath; nausea; stomach pain; increased hunger or thirst; increased urination -signs and symptoms of kidney injury like trouble passing urine or change in the amount of urine -signs and symptoms of liver injury like dark yellow or brown urine; general ill feeling or flu-like symptoms; light-colored stools; loss of appetite; nausea; right upper belly pain; unusually weak or tired; yellowing of the eyes or skin -stiff neck -swelling of the ankles, feet, hands -weight gain Side effects that usually do not require medical attention (report to your doctor or health care professional if they continue or are bothersome): -bone pain -constipation -tiredness -vomiting This list may not describe all possible side effects. Call your doctor for medical advice about side effects. You may report side effects to FDA at 1-800-FDA-1088. Where should I keep my medicine? This drug is given in a hospital or clinic and will not be stored at home. NOTE: This sheet is a summary. It may not cover all possible information. If you have questions about this medicine, talk to your doctor,  pharmacist, or health care provider.  2018 Elsevier/Gold Standard (2015-12-29 17:49:34)

## 2016-12-13 NOTE — Progress Notes (Signed)
Pt aware of blood sugar of 271 today, asymptomatic. Pt has DM, will take insulin when gets home.   @1408  Opdivo infusion completed and pt stated she has "blurry vision" when she looks up from reading. Pt states she normally wears glasses but does not have them with her. VSS. Will continue to monitor. Pt understands that it is probably because she is not wearing her glasses.

## 2016-12-14 ENCOUNTER — Ambulatory Visit
Admission: RE | Admit: 2016-12-14 | Discharge: 2016-12-14 | Disposition: A | Discharge status: Home / Self Care | Payer: Medicaid Other | Source: Ambulatory Visit | Attending: Radiation Oncology | Admitting: Radiation Oncology

## 2016-12-14 DIAGNOSIS — C3491 Malignant neoplasm of unspecified part of right bronchus or lung: Secondary | ICD-10-CM | POA: Diagnosis not present

## 2016-12-15 ENCOUNTER — Ambulatory Visit
Admission: RE | Admit: 2016-12-15 | Discharge: 2016-12-15 | Disposition: A | Discharge status: Home / Self Care | Payer: Medicaid Other | Source: Ambulatory Visit | Attending: Radiation Oncology | Admitting: Radiation Oncology

## 2016-12-15 DIAGNOSIS — C3491 Malignant neoplasm of unspecified part of right bronchus or lung: Secondary | ICD-10-CM | POA: Diagnosis not present

## 2016-12-16 ENCOUNTER — Ambulatory Visit
Admission: RE | Admit: 2016-12-16 | Discharge: 2016-12-16 | Disposition: A | Discharge status: Home / Self Care | Payer: Medicaid Other | Source: Ambulatory Visit | Attending: Radiation Oncology | Admitting: Radiation Oncology

## 2016-12-16 DIAGNOSIS — C3491 Malignant neoplasm of unspecified part of right bronchus or lung: Secondary | ICD-10-CM | POA: Diagnosis not present

## 2016-12-17 ENCOUNTER — Telehealth: Payer: Self-pay

## 2016-12-17 ENCOUNTER — Ambulatory Visit (HOSPITAL_BASED_OUTPATIENT_CLINIC_OR_DEPARTMENT_OTHER): Payer: Medicaid Other

## 2016-12-17 ENCOUNTER — Ambulatory Visit
Admission: RE | Admit: 2016-12-17 | Discharge: 2016-12-17 | Disposition: A | Discharge status: Home / Self Care | Payer: Medicaid Other | Source: Ambulatory Visit | Attending: Radiation Oncology | Admitting: Radiation Oncology

## 2016-12-17 DIAGNOSIS — C3491 Malignant neoplasm of unspecified part of right bronchus or lung: Secondary | ICD-10-CM | POA: Diagnosis not present

## 2016-12-17 DIAGNOSIS — Z452 Encounter for adjustment and management of vascular access device: Secondary | ICD-10-CM

## 2016-12-17 DIAGNOSIS — C770 Secondary and unspecified malignant neoplasm of lymph nodes of head, face and neck: Secondary | ICD-10-CM | POA: Diagnosis not present

## 2016-12-17 DIAGNOSIS — Z95828 Presence of other vascular implants and grafts: Secondary | ICD-10-CM

## 2016-12-17 DIAGNOSIS — C792 Secondary malignant neoplasm of skin: Secondary | ICD-10-CM | POA: Diagnosis not present

## 2016-12-17 MED ORDER — HEPARIN SOD (PORK) LOCK FLUSH 100 UNIT/ML IV SOLN
250.0000 [IU] | Freq: Once | INTRAVENOUS | Status: AC
  Administered 2016-12-17: 250 [IU]
  Filled 2016-12-17: qty 5

## 2016-12-17 MED ORDER — SODIUM CHLORIDE 0.9% FLUSH
10.0000 mL | Freq: Once | INTRAVENOUS | Status: AC
  Administered 2016-12-17: 10 mL
  Filled 2016-12-17: qty 10

## 2016-12-17 NOTE — Progress Notes (Signed)
[name]    HEMATOLOGY/ONCOLOGY CLINIC NOTE  Date of Service: 12/22/2016   Patient Care Team: [name], FNP as PCP - General (Nurse Practitioner)  CHIEF COMPLAINTS/PURPOSE OF CONSULTATION:  f/u Lung Adenocarcinoma.  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  Current treatment: -Started Maintainence Durvalumab from  05/24/2016 q2weeks, switched to Nivolumab q2weeks on 12/13/16 once metastatic disease noted -s/p palliative RT to rt sided Subcutaneous mass.  PreviousTreatment -Concurrent Chemo-radiation with Carboplatin/Taxol. Completed RT on 02/13/2016 and has then completed 2 cycles of carboplatin + taxol.  HISTORY OF PRESENTING ILLNESS: Plz see my previous note for details on initial presentation.  INTERVAL HISTORY  Marilyn [name] is here for her followup of her lung cancer.  She presented in the clinic today accompanied by her mother.  She notes she completed her radiation therapy 2 days ago. She has not noticed much change in area, but she has tolerated radiation well other than site discomfort.   She has not been regularly taking levothyroxine 125 mcg. She takes it twice a week because she does not like the taste. I discussed not regularly taking her medication will not better her fatigue and anemia. I strongly suggested her to take it as prescribed and I will adjust medication if needed. She does not need a refill. I will manage it until she finds a rheumatologist.  She has a rash on her lateral right arm that appeared a few weeks ago before starting nivolumab and is improving with topical hydrocortisone. I efilled her Percocet that she uses as needed for her pain.  She has not taking her heart medication from her cardiologist. She says her pharmacy is not giving her the correct mediation since she switched medication. She notes she will make sure she gets the correct medication.   MEDICAL HISTORY:  Past Medical History:  Diagnosis Date    . Asthma   . Diabetes mellitus   . Hypercholesteremia   . Hypertension   . Lung cancer ([place])   . Neuropathy   . Obese     SURGICAL HISTORY: Past Surgical History:  Procedure Laterality Date  . CESAREAN SECTION    . IR GENERIC HISTORICAL  01/08/2016   IR US GUIDE VASC ACCESS RIGHT 01/08/2016 [name], DO [facility]  . IR GENERIC HISTORICAL  01/08/2016   IR FLUORO GUIDE CV LINE RIGHT 01/08/2016 [name], DO [facility]  . TUBAL LIGATION    . US guided core needle biopsy     of right lower neck/supraclavicular lymph nodes    SOCIAL HISTORY: Social History   Social History  . Marital status: Divorced    Spouse name: N/A  . Number of children: N/A  . Years of education: N/A   Occupational History  . Not on file.   Social History Main Topics  . Smoking status: Never Smoker  . Smokeless tobacco: Never Used  . Alcohol use No  . Drug use: No  . Sexual activity: Not Currently   Other Topics Concern  . Not on file   Social History Narrative   No bird or mold exposure. No recent travel.    FAMILY HISTORY: Family History  Problem Relation Age of Onset  . Heart failure Mother   . Hypertension Mother   . Cancer Neg Hx   . Rheumatologic disease Neg Hx     ALLERGIES:  has No Known Allergies.  MEDICATIONS:  Current Outpatient Prescriptions  Medication Sig Dispense Refill  . aspirin EC 81 MG  tablet Take 1 tablet (81 mg total) by mouth daily. 60 tablet 2  . benzonatate (TESSALON) 100 MG capsule Take 1 capsule (100 mg total) by mouth 3 (three) times daily as needed for cough. 60 capsule 0  . carvedilol (COREG) 6.25 MG tablet Take 1 tablet (6.25 mg total) by mouth 2 (two) times daily. 180 tablet 3  . DULoxetine (CYMBALTA) 60 MG capsule Take 1 capsule (60 mg total) by mouth daily. 30 capsule 2  . insulin aspart (NOVOLOG) 100 UNIT/ML FlexPen As per sliding scale instructions (Patient taking differently: Inject 0-20 Units into the skin 3 (three) times daily as  needed for high blood sugar. As per sliding scale instructions) 15 mL 2  . Insulin Degludec (TRESIBA FLEXTOUCH) 200 UNIT/ML SOPN Inject 100 Units into the skin daily before breakfast.     . levothyroxine (SYNTHROID, LEVOTHROID) 125 MCG tablet Take 1 tablet (125 mcg total) by mouth daily before breakfast. 30 tablet 2  . loratadine (CLARITIN) 10 MG tablet Take 10 mg by mouth daily.    [name] oxyCODONE-acetaminophen (PERCOCET/ROXICET) 5-325 MG tablet Take 1-2 tablets by mouth every 6 (six) hours as needed for moderate pain or severe pain. 40 tablet 0  . polyethylene glycol (MIRALAX) packet Take 17 g by mouth daily. 30 each 1  . senna-docusate (SENNA S) 8.6-50 MG tablet Take 2 tablets by mouth 2 (two) times daily. May reduce to 2 tab po HS as needed once regular BM estabished 60 tablet 1   No current facility-administered medications for this visit.    Facility-Administered Medications Ordered in Other Visits  Medication Dose Route Frequency Provider Last Rate Last Dose  . sodium chloride 0.9 % injection 10 mL  10 mL Intravenous PRN [name], MD   10 mL at 05/24/16 1252    REVIEW OF SYSTEMS:    10 Point review of Systems was done is negative except as noted above.  PHYSICAL EXAMINATION: ECOG PERFORMANCE STATUS: 1 - Symptomatic but completely ambulatory  . Vitals:   12/22/16 1038  BP: 103/75  Pulse: (!) 118  Resp: 18  Temp: 98.1 F (36.7 C)  SpO2: 100%   Filed Weights   12/22/16 1038  Weight: 277 lb 3.2 oz (125.7 kg)   .Body mass index is 40.35 kg/m.  GENERAL:alert, in no acute distress and comfortable SKIN: radiation related skin injury over neck - healing EYES: normal, conjunctiva are pink and non-injected, sclera clear OROPHARYNX:oral thrush noted NECK: supple, no JVD, thyroid normal size, non-tender, without nodularity LYMPH:  no palpable lymphadenopathy in the cervical, axillary or inguinal.             LUNGS: clear to auscultation with normal respiratory effort.  Distant breath sounds  HEART: regular rate & rhythm,  no murmurs and no lower extremity edema ABDOMEN: abdomen obese, soft, non-tender, normoactive bowel sounds , rt flank subcutaneous mass palpable. Musculoskeletal: no cyanosis of digits and no clubbing  PSYCH: alert & oriented x 3 with fluent speech NEURO: no focal motor/sensory deficits  LABORATORY DATA: .  [name] CBC Latest Ref Rng & Units 12/22/2016 12/13/2016 11/29/2016  WBC 3.9 - 10.3 10e3/uL 6.3 7.0 6.7  Hemoglobin 11.6 - 15.9 g/dL 9.1(L) 9.4(L) 9.5(L)  Hematocrit 34.8 - 46.6 % 27.5(L) 28.9(L) 29.2(L)  Platelets 145 - 400 10e3/uL 291 311 239   . CMP Latest Ref Rng & Units 12/22/2016 12/13/2016 11/29/2016  Glucose 70 - 140 mg/dl 230(H) 271(H) 233(H)  BUN 7.0 - 26.0 mg/dL 9.0 6.5(L) 10.6  Creatinine 0.6 -  1.1 mg/dL 0.9 0.9 0.9  Sodium 136 - 145 mEq/L 136 135(L) 139  Potassium 3.5 - 5.1 mEq/L 3.8 3.8 3.9  Chloride 101 - 111 mmol/L - - -  CO2 22 - 29 mEq/L _0 Calcium 8.4 - 10.4 mg/dL 9.5 9.4 9.8  Total Protein 6.4 - 8.3 g/dL 7.8 7.8 7.8  Total Bilirubin 0.20 - 1.20 mg/dL 0.34 0.30 0.34  Alkaline Phos 40 - 150 U/L 87 86 87  AST 5 - 34 U/L _1 ALT 0 - 55 U/L _2 RADIOGRAPHIC STUDIES:  .No results found.   ASSESSMENT & PLAN:   51 year old African-American female with history of hypertension, diabetes, asthma, dysuria mass and morbid obesity with   #1 h/o Lung Adenocarcinoma Rt sided atleast Stage IIIB (on diagnosis) with large right paratracheal mass with mediastinal adenopathy that appears to have grown significantly over the last 6-7 months and rt supraclavicular LN +.  -Noted to have a small mass in the left adrenal on CT but PET/CT neg for metastatic disease. Patient has been a lifelong nonsmoker -MRI of the brain was negative for any metastatic disease. At diagnosis. -High PDL1 expression (90%) on [facility] Neg for EGFR, ALK, ROS-1 and BRAF mutations. -Patient completed her  planned definitive chemoradiation with carbo Taxol on 02/13/2016. No prohibitive toxicities other than some grade 1 skin desquamation and some grade 1-2 radiation esophagitis. She has subsequently received 2 out of 2 planned dose of carboplatin + Taxol. -Patient has completed 11 doses of maintenance   -CTA chest 10/29/2016  no evidence of lung cancer progression in the chest. No PE -CT abd/pelvis-10/29/2016  Interval 5.0 x 5.0 x 4.7 cm mass in the right lateral subcutaneous fat at the level of the mid abdomen. This has CT features compatible with a metastasis or primary neoplasm.  #2 Now with Metastatic lung adenocarcinoma with isolated metastases in the right lateral subcutaneous fat at the level of the midabdomen. -she received maintainence Durvalumab from  05/24/2016 q2weeks, switched to Nivolumab q2weeks on 12/13/16 in the setting of Biopsy proven isolated metastatic disease. Given the tumor is strongly PDL1 positive and patient has only isolated metastatic disease will continue immunotherapy at this time in the absence of more significant evidence of disease progression. - Continue when necessary Percocet for pain management  -She has seen radiation oncology and has recently completed palliative radiation to her metastatic subcutaneous mass. This might also help with tumor antigen discovery and better response to Immunotherapy. -pt completed radiation on 12/20/16, tolerated well. Has right side discomforted from radiation but otherwise recovering well.   #3 h/o radiation pneumonitis - no new symptoms  #4 Improving fatigue (grade 1)  -working with PT once weekly and encourage to walk as much as possible  - optimize DM2 management  - Mx of newly diagnosed hypothyroidism. - patient not taking levothyroxine as instructed and notes that she will take this appropriate now. Continue Levolthyroxine 16mg ---will likely need larger dose but in the setting of baseline tachycardia we are gradually increasing  dose to avoid ppt cardiac arrhythmias. -She has been non-compliant with  Levolthyroxine 1220m. I strongly encouraged her to take it as prescribed to help with her fatigue and anemia. Will f/u to see if dose increase is needed.   #5 normocytic anemia related to chemotherapy -resolving and stable Hg at 9.1  #6 Left chest wall pain due to  costochondritis - mx with pain medications. CTA x 2 neg for PE   #7 Sinus tachycardia - on Cardizem CR , no PE on CTA x 2  -Still having sinus tachycardia . Partly from deconditioning and anemia. Seeing Dr. Debara Pickett from cardiology  -Cardizem CR 129m po daily was previously discontinued and switched to COREG 6.25 mg by her cardiologist. I strongly encouraged her to make sure she takes it as prescribed.    #8 neoplasm related pain is currently controlled without significant medications. Clinically likely has sleep apnea . Would need to be careful with sedative medications . -recommended sleep study with PCP  #9 hypertension  diabetes -uncontrolled  Dyslipidemia  Asthma - insulin SS for uncontrolled hyperglycemia in the setting of steroid use with chemotherapy. 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 No focal motor deficits. Given concern for metastatic lesion and abdominal wall would also rule out metastatic disease to the brain in the setting of her headaches. -MRI Brain w and wo contrast done - no evidence of metastatic disease -- headache now resolved -- likely tension headache.   PLAN:  :Continue Nivolumab every 2 weeks, started 12/13/16 -So far no toxicities casued by medication, tolerating well  -pt completed radiation on 12/20/16, tolerated well. Has right side discomforted from radiation but otherwise recovering  well.   -Refilled pain medication for her today  .    RTC with Dr KaIrene Limbon 2 weeks with labs.   All of the patients questions were answered with apparent satisfaction. The patient knows to call the clinic with any problems, questions or concerns.  I spent 20 minutes counseling the patient face to face. The total time spent in the appointment was 30 minutes and more than 50% was on counseling and direct patient cares.  This document serves as a record of services personally performed by GaSullivan LoneMD. It was created on her behalf by AmJoslyn Devona trained medical scribe. The creation of this record is based on the scribe's personal observations and the provider's statements to them. This document has been checked and approved by the attending provider.   GaSullivan LoneD MSClintonAHIVMS SCHawkins County Memorial HospitalTMagee General Hospitalematology/Oncology Physician CoBaptist Medical Center - Nassau(Office):       [phone] cell):  [phone]):           [phone]

## 2016-12-17 NOTE — Telephone Encounter (Signed)
Pt has been referred and not shown up for appointments with endocrinologist in the past. Have called pt to explain need for referral related to hypothyroidism that needs to be managed. Pt in agreement as long as insurance will approve. Scheduling message sent to see if pt can be seen by Dr. Elayne Snare at [facility].

## 2016-12-20 ENCOUNTER — Ambulatory Visit
Admission: RE | Admit: 2016-12-20 | Discharge: 2016-12-20 | Disposition: A | Discharge status: Home / Self Care | Payer: Medicaid Other | Source: Ambulatory Visit | Attending: Radiation Oncology | Admitting: Radiation Oncology

## 2016-12-20 ENCOUNTER — Ambulatory Visit (HOSPITAL_BASED_OUTPATIENT_CLINIC_OR_DEPARTMENT_OTHER): Payer: Medicaid Other

## 2016-12-20 ENCOUNTER — Encounter: Payer: Self-pay | Admitting: Radiation Oncology

## 2016-12-20 DIAGNOSIS — C3491 Malignant neoplasm of unspecified part of right bronchus or lung: Secondary | ICD-10-CM

## 2016-12-20 DIAGNOSIS — Z95828 Presence of other vascular implants and grafts: Secondary | ICD-10-CM

## 2016-12-20 DIAGNOSIS — Z452 Encounter for adjustment and management of vascular access device: Secondary | ICD-10-CM | POA: Diagnosis present

## 2016-12-20 MED ORDER — HEPARIN SOD (PORK) LOCK FLUSH 100 UNIT/ML IV SOLN
500.0000 [IU] | Freq: Once | INTRAVENOUS | Status: AC | PRN
  Administered 2016-12-20: 250 [IU] via INTRAVENOUS
  Filled 2016-12-20: qty 5

## 2016-12-20 MED ORDER — SODIUM CHLORIDE 0.9 % IJ SOLN
10.0000 mL | INTRAMUSCULAR | Status: DC | PRN
  Administered 2016-12-20: 10 mL via INTRAVENOUS
  Filled 2016-12-20: qty 10

## 2016-12-22 ENCOUNTER — Other Ambulatory Visit (HOSPITAL_BASED_OUTPATIENT_CLINIC_OR_DEPARTMENT_OTHER): Payer: Medicaid Other

## 2016-12-22 ENCOUNTER — Ambulatory Visit: Payer: Medicaid Other

## 2016-12-22 ENCOUNTER — Ambulatory Visit (HOSPITAL_BASED_OUTPATIENT_CLINIC_OR_DEPARTMENT_OTHER): Payer: Medicaid Other | Admitting: Hematology

## 2016-12-22 VITALS — BP 103/75 | HR 118 | Temp 98.1°F | Resp 18 | Ht 69.5 in | Wt 277.2 lb

## 2016-12-22 DIAGNOSIS — E119 Type 2 diabetes mellitus without complications: Secondary | ICD-10-CM | POA: Diagnosis not present

## 2016-12-22 DIAGNOSIS — E1140 Type 2 diabetes mellitus with diabetic neuropathy, unspecified: Secondary | ICD-10-CM | POA: Diagnosis not present

## 2016-12-22 DIAGNOSIS — C770 Secondary and unspecified malignant neoplasm of lymph nodes of head, face and neck: Secondary | ICD-10-CM

## 2016-12-22 DIAGNOSIS — C792 Secondary malignant neoplasm of skin: Secondary | ICD-10-CM

## 2016-12-22 DIAGNOSIS — I10 Essential (primary) hypertension: Secondary | ICD-10-CM

## 2016-12-22 DIAGNOSIS — D6481 Anemia due to antineoplastic chemotherapy: Secondary | ICD-10-CM

## 2016-12-22 DIAGNOSIS — R21 Rash and other nonspecific skin eruption: Secondary | ICD-10-CM | POA: Diagnosis not present

## 2016-12-22 DIAGNOSIS — G893 Neoplasm related pain (acute) (chronic): Secondary | ICD-10-CM | POA: Diagnosis not present

## 2016-12-22 DIAGNOSIS — C3491 Malignant neoplasm of unspecified part of right bronchus or lung: Secondary | ICD-10-CM | POA: Diagnosis not present

## 2016-12-22 DIAGNOSIS — R1900 Intra-abdominal and pelvic swelling, mass and lump, unspecified site: Secondary | ICD-10-CM

## 2016-12-22 DIAGNOSIS — Z452 Encounter for adjustment and management of vascular access device: Secondary | ICD-10-CM

## 2016-12-22 DIAGNOSIS — Z95828 Presence of other vascular implants and grafts: Secondary | ICD-10-CM

## 2016-12-22 LAB — COMPREHENSIVE METABOLIC PANEL
ALBUMIN: 3.1 g/dL — AB (ref 3.5–5.0)
ALK PHOS: 87 U/L (ref 40–150)
ALT: 7 U/L (ref 0–55)
ANION GAP: 10 meq/L (ref 3–11)
AST: 13 U/L (ref 5–34)
BUN: 9 mg/dL (ref 7.0–26.0)
CALCIUM: 9.5 mg/dL (ref 8.4–10.4)
CHLORIDE: 99 meq/L (ref 98–109)
CO2: 27 mEq/L (ref 22–29)
CREATININE: 0.9 mg/dL (ref 0.6–1.1)
EGFR: >90 mL/min/{1.73_m2} (ref >90)
Glucose: 230 mg/dl — ABNORMAL HIGH (ref 70–140)
POTASSIUM: 3.8 meq/L (ref 3.5–5.1)
Sodium: 136 mEq/L (ref 136–145)
Total Bilirubin: 0.34 mg/dL (ref 0.20–1.20)
Total Protein: 7.8 g/dL (ref 6.4–8.3)

## 2016-12-22 LAB — CBC & DIFF AND RETIC
BASO%: 0.3 % (ref 0.0–2.0)
BASOS ABS: 0 10*3/uL (ref 0.0–0.1)
EOS ABS: 0.2 10*3/uL (ref 0.0–0.5)
EOS%: 2.8 % (ref 0.0–7.0)
HEMATOCRIT: 27.5 % — AB (ref 34.8–46.6)
HEMOGLOBIN: 9.1 g/dL — AB (ref 11.6–15.9)
Immature Retic Fract: 9.2 % (ref 1.60–10.00)
LYMPH%: 14.7 % (ref 14.0–49.7)
MCH: 29.6 pg (ref 25.1–34.0)
MCHC: 33.1 g/dL (ref 31.5–36.0)
MCV: 89.6 fL (ref 79.5–101.0)
MONO#: 0.3 10*3/uL (ref 0.1–0.9)
MONO%: 5.4 % (ref 0.0–14.0)
NEUT#: 4.9 10*3/uL (ref 1.5–6.5)
NEUT%: 76.8 % (ref 38.4–76.8)
PLATELETS: 291 10*3/uL (ref 145–400)
RBC: 3.07 10*6/uL — ABNORMAL LOW (ref 3.70–5.45)
RDW: 12.6 % (ref 11.2–14.5)
Retic %: 1.47 % (ref 0.70–2.10)
Retic Ct Abs: 45.13 10*3/uL (ref 33.70–90.70)
WBC: 6.3 10*3/uL (ref 3.9–10.3)
lymph#: 0.9 10*3/uL (ref 0.9–3.3)

## 2016-12-22 MED ORDER — SODIUM CHLORIDE 0.9% FLUSH
10.0000 mL | INTRAVENOUS | Status: DC | PRN
  Administered 2016-12-22: 10 mL via INTRAVENOUS
  Filled 2016-12-22: qty 10

## 2016-12-22 MED ORDER — HEPARIN SOD (PORK) LOCK FLUSH 100 UNIT/ML IV SOLN
500.0000 [IU] | Freq: Once | INTRAVENOUS | Status: AC
  Administered 2016-12-22: 250 [IU] via INTRAVENOUS
  Filled 2016-12-22: qty 5

## 2016-12-22 MED ORDER — OXYCODONE-ACETAMINOPHEN 5-325 MG PO TABS: 1.0000 | ORAL_TABLET | Freq: Four times a day (QID) | ORAL | 0 refills | Status: DC | PRN

## 2016-12-22 NOTE — Progress Notes (Signed)
°  Radiation Oncology         [phone] ________________________________  Name: Marilyn [name] MRN: [id number]  Date: 12/20/2016  DOB: 1965-11-27  End of Treatment Note  Diagnosis:   51 yo woman with a 5 cm right lateral abdomen subcutaneous metastasis     Indication for treatment:  Palliative     Radiation treatment dates:   12/07/2016 to 12/20/2016  Site/dose:   The abdomen (Right flank) was treated to 30 Gy in 10 fractions of 3 Gy.  Beams/energy:   Static // 10X, 6X  Narrative: The patient tolerated radiation treatment relatively well.   She reported mild fatigue and pain in her right flank but denied chest pain. She did note some shortness of breath with activity but denies any change from her baseline normal. She denied any cough or hemoptysis. She reported a good appetite, maintaining her weight and denies dysphagia.  Plan: The patient has completed radiation treatment. The patient will return to radiation oncology clinic for routine followup in one month. I advised her to call or return sooner if she has any questions or concerns related to her recovery or treatment. ________________________________  Sheral Apley. Tammi Klippel, M.D.   This document serves as a record of services personally performed by Tyler Pita, MD. It was created on his behalf by Arlyce Harman, a trained medical scribe. The creation of this record is based on the scribe's personal observations and the provider's statements to them. This document has been checked and approved by the attending provider.

## 2016-12-24 ENCOUNTER — Ambulatory Visit: Payer: Medicaid Other | Admitting: Internal Medicine

## 2016-12-24 ENCOUNTER — Ambulatory Visit (HOSPITAL_BASED_OUTPATIENT_CLINIC_OR_DEPARTMENT_OTHER): Payer: Medicaid Other

## 2016-12-24 DIAGNOSIS — Z95828 Presence of other vascular implants and grafts: Secondary | ICD-10-CM

## 2016-12-24 DIAGNOSIS — C792 Secondary malignant neoplasm of skin: Secondary | ICD-10-CM

## 2016-12-24 DIAGNOSIS — Z452 Encounter for adjustment and management of vascular access device: Secondary | ICD-10-CM | POA: Diagnosis not present

## 2016-12-24 DIAGNOSIS — C770 Secondary and unspecified malignant neoplasm of lymph nodes of head, face and neck: Secondary | ICD-10-CM | POA: Diagnosis not present

## 2016-12-24 DIAGNOSIS — C3491 Malignant neoplasm of unspecified part of right bronchus or lung: Secondary | ICD-10-CM | POA: Diagnosis not present

## 2016-12-24 MED ORDER — SODIUM CHLORIDE 0.9 % IJ SOLN
10.0000 mL | INTRAMUSCULAR | Status: DC | PRN
  Administered 2016-12-24: 10 mL via INTRAVENOUS
  Filled 2016-12-24: qty 10

## 2016-12-24 MED ORDER — HEPARIN SOD (PORK) LOCK FLUSH 100 UNIT/ML IV SOLN
500.0000 [IU] | Freq: Once | INTRAVENOUS | Status: AC | PRN
  Administered 2016-12-24: 250 [IU] via INTRAVENOUS
  Filled 2016-12-24: qty 5

## 2016-12-27 ENCOUNTER — Telehealth: Payer: Self-pay

## 2016-12-27 ENCOUNTER — Ambulatory Visit (HOSPITAL_BASED_OUTPATIENT_CLINIC_OR_DEPARTMENT_OTHER): Payer: Medicaid Other

## 2016-12-27 VITALS — BP 129/95 | HR 126 | Temp 97.9°F | Resp 18

## 2016-12-27 DIAGNOSIS — C3491 Malignant neoplasm of unspecified part of right bronchus or lung: Secondary | ICD-10-CM | POA: Diagnosis not present

## 2016-12-27 DIAGNOSIS — Z5112 Encounter for antineoplastic immunotherapy: Secondary | ICD-10-CM | POA: Diagnosis not present

## 2016-12-27 DIAGNOSIS — C792 Secondary malignant neoplasm of skin: Secondary | ICD-10-CM | POA: Diagnosis not present

## 2016-12-27 DIAGNOSIS — C770 Secondary and unspecified malignant neoplasm of lymph nodes of head, face and neck: Secondary | ICD-10-CM | POA: Diagnosis not present

## 2016-12-27 MED ORDER — SODIUM CHLORIDE 0.9 % IV SOLN
Freq: Once | INTRAVENOUS | Status: AC
  Administered 2016-12-27: 12:00:00 via INTRAVENOUS

## 2016-12-27 MED ORDER — SODIUM CHLORIDE 0.9 % IV SOLN
240.0000 mg | Freq: Once | INTRAVENOUS | Status: AC
  Administered 2016-12-27: 240 mg via INTRAVENOUS
  Filled 2016-12-27: qty 24

## 2016-12-27 MED ORDER — SODIUM CHLORIDE 0.9% FLUSH
10.0000 mL | INTRAVENOUS | Status: DC | PRN
  Administered 2016-12-27: 10 mL
  Filled 2016-12-27: qty 10

## 2016-12-27 MED ORDER — HEPARIN SOD (PORK) LOCK FLUSH 100 UNIT/ML IV SOLN
250.0000 [IU] | Freq: Once | INTRAVENOUS | Status: AC | PRN
Start: 2016-12-27 — End: 2016-12-27
  Administered 2016-12-27: 250 [IU]
  Filled 2016-12-27: qty 5

## 2016-12-27 NOTE — Progress Notes (Signed)
Okay to treat with heart of 125-127 per Dr. Irene Limbo

## 2016-12-27 NOTE — Telephone Encounter (Signed)
Called pt at [phone] and communicated need to contact case worker regarding an endocrinologist that can be seen with her insurance. Pt verbalized understanding after explanation of contact number on her new Medicaid Insurance card that she can call.

## 2016-12-27 NOTE — Patient Instructions (Signed)
[facility] Discharge Instructions for Patients Receiving Chemotherapy  Today you received the following chemotherapy agents OPDIVO  To help prevent nausea and vomiting after your treatment, we encourage you to take your nausea medication as directed  If you develop nausea and vomiting that is not controlled by your nausea medication, call the clinic.   BELOW ARE SYMPTOMS THAT SHOULD BE REPORTED IMMEDIATELY:  *FEVER GREATER THAN 100.5 F  *CHILLS WITH OR WITHOUT FEVER  NAUSEA AND VOMITING THAT IS NOT CONTROLLED WITH YOUR NAUSEA MEDICATION  *UNUSUAL SHORTNESS OF BREATH  *UNUSUAL BRUISING OR BLEEDING  TENDERNESS IN MOUTH AND THROAT WITH OR WITHOUT PRESENCE OF ULCERS  *URINARY PROBLEMS  *BOWEL PROBLEMS  UNUSUAL RASH Items with * indicate a potential emergency and should be followed up as soon as possible.  Feel free to call the clinic should you have any questions or concerns. The clinic phone number is [phone].  Please show the [facility] at check-in to the Emergency Department and triage nurse.

## 2016-12-29 ENCOUNTER — Ambulatory Visit (HOSPITAL_BASED_OUTPATIENT_CLINIC_OR_DEPARTMENT_OTHER): Payer: Medicaid Other

## 2016-12-29 DIAGNOSIS — C3491 Malignant neoplasm of unspecified part of right bronchus or lung: Secondary | ICD-10-CM | POA: Diagnosis not present

## 2016-12-29 DIAGNOSIS — C792 Secondary malignant neoplasm of skin: Secondary | ICD-10-CM | POA: Diagnosis not present

## 2016-12-29 DIAGNOSIS — Z95828 Presence of other vascular implants and grafts: Secondary | ICD-10-CM

## 2016-12-29 DIAGNOSIS — C770 Secondary and unspecified malignant neoplasm of lymph nodes of head, face and neck: Secondary | ICD-10-CM

## 2016-12-29 DIAGNOSIS — Z452 Encounter for adjustment and management of vascular access device: Secondary | ICD-10-CM

## 2016-12-29 MED ORDER — SODIUM CHLORIDE 0.9 % IJ SOLN
10.0000 mL | INTRAMUSCULAR | Status: DC | PRN
  Administered 2016-12-29: 10 mL via INTRAVENOUS
  Filled 2016-12-29: qty 10

## 2016-12-29 MED ORDER — HEPARIN SOD (PORK) LOCK FLUSH 100 UNIT/ML IV SOLN
500.0000 [IU] | Freq: Once | INTRAVENOUS | Status: AC | PRN
  Administered 2016-12-29: 250 [IU] via INTRAVENOUS
  Filled 2016-12-29: qty 5

## 2016-12-31 ENCOUNTER — Ambulatory Visit: Payer: Medicaid Other | Admitting: Internal Medicine

## 2017-01-01 ENCOUNTER — Emergency Department (HOSPITAL_COMMUNITY): Payer: Medicaid Other

## 2017-01-01 ENCOUNTER — Encounter (HOSPITAL_COMMUNITY): Payer: Self-pay

## 2017-01-01 ENCOUNTER — Emergency Department (HOSPITAL_COMMUNITY)
Admission: EM | Admit: 2017-01-01 | Discharge: 2017-01-02 | Disposition: A | Discharge status: Home / Self Care | Payer: Medicaid Other | Attending: Emergency Medicine | Admitting: Emergency Medicine

## 2017-01-01 DIAGNOSIS — Z85118 Personal history of other malignant neoplasm of bronchus and lung: Secondary | ICD-10-CM | POA: Insufficient documentation

## 2017-01-01 DIAGNOSIS — R072 Precordial pain: Secondary | ICD-10-CM | POA: Diagnosis not present

## 2017-01-01 DIAGNOSIS — Z794 Long term (current) use of insulin: Secondary | ICD-10-CM | POA: Diagnosis not present

## 2017-01-01 DIAGNOSIS — Z79899 Other long term (current) drug therapy: Secondary | ICD-10-CM | POA: Diagnosis not present

## 2017-01-01 DIAGNOSIS — J45909 Unspecified asthma, uncomplicated: Secondary | ICD-10-CM | POA: Diagnosis not present

## 2017-01-01 DIAGNOSIS — I5021 Acute systolic (congestive) heart failure: Secondary | ICD-10-CM | POA: Diagnosis not present

## 2017-01-01 DIAGNOSIS — I110 Hypertensive heart disease with heart failure: Secondary | ICD-10-CM | POA: Diagnosis not present

## 2017-01-01 DIAGNOSIS — E119 Type 2 diabetes mellitus without complications: Secondary | ICD-10-CM | POA: Insufficient documentation

## 2017-01-01 DIAGNOSIS — Z7982 Long term (current) use of aspirin: Secondary | ICD-10-CM | POA: Diagnosis not present

## 2017-01-01 DIAGNOSIS — E039 Hypothyroidism, unspecified: Secondary | ICD-10-CM | POA: Diagnosis not present

## 2017-01-01 NOTE — ED Triage Notes (Signed)
Pt c/o SOB, abdominal and flank pain, general malaise, and headache since Thursday evening. She is currently receiving treatment for lung cancer with her last treatment being 9/24 per pt. Pt A&Ox4. Ambulatory.

## 2017-01-02 ENCOUNTER — Other Ambulatory Visit: Payer: Self-pay

## 2017-01-02 ENCOUNTER — Emergency Department (HOSPITAL_COMMUNITY): Payer: Medicaid Other

## 2017-01-02 ENCOUNTER — Encounter (HOSPITAL_COMMUNITY): Payer: Self-pay | Admitting: Radiology

## 2017-01-02 LAB — URINALYSIS, ROUTINE W REFLEX MICROSCOPIC
Bilirubin Urine: NEGATIVE
GLUCOSE, UA: NEGATIVE mg/dL
HGB URINE DIPSTICK: NEGATIVE
Ketones, ur: NEGATIVE mg/dL
LEUKOCYTES UA: NEGATIVE
NITRITE: POSITIVE — AB
PROTEIN: NEGATIVE mg/dL
Specific Gravity, Urine: >1.046 — ABNORMAL HIGH (ref 1.005–1.030)
pH: 5 (ref 5.0–8.0)

## 2017-01-02 LAB — COMPREHENSIVE METABOLIC PANEL
ALK PHOS: 86 U/L (ref 38–126)
ALT: 13 U/L — ABNORMAL LOW (ref 14–54)
ANION GAP: 8 (ref 5–15)
AST: 17 U/L (ref 15–41)
Albumin: 2.9 g/dL — ABNORMAL LOW (ref 3.5–5.0)
BILIRUBIN TOTAL: 0.4 mg/dL (ref 0.3–1.2)
BUN: 11 mg/dL (ref 6–20)
CALCIUM: 8.3 mg/dL — AB (ref 8.9–10.3)
CO2: 28 mmol/L (ref 22–32)
Chloride: 101 mmol/L (ref 101–111)
Creatinine, Ser: 0.65 mg/dL (ref 0.44–1.00)
Glucose, Bld: 217 mg/dL — ABNORMAL HIGH (ref 65–99)
Potassium: 3.7 mmol/L (ref 3.5–5.1)
Sodium: 137 mmol/L (ref 135–145)
TOTAL PROTEIN: 6.8 g/dL (ref 6.5–8.1)

## 2017-01-02 LAB — CBC
HCT: 26.7 % — ABNORMAL LOW (ref 36.0–46.0)
HEMOGLOBIN: 8.8 g/dL — AB (ref 12.0–15.0)
MCH: 29.7 pg (ref 26.0–34.0)
MCHC: 33 g/dL (ref 30.0–36.0)
MCV: 90.2 fL (ref 78.0–100.0)
Platelets: 308 10*3/uL (ref 150–400)
RBC: 2.96 MIL/uL — AB (ref 3.87–5.11)
RDW: 12.8 % (ref 11.5–15.5)
WBC: 5.4 10*3/uL (ref 4.0–10.5)

## 2017-01-02 LAB — POCT I-STAT TROPONIN I: Troponin i, poc: 0.01 ng/mL (ref 0.00–0.08)

## 2017-01-02 LAB — LIPASE, BLOOD: Lipase: 19 U/L (ref 11–51)

## 2017-01-02 LAB — I-STAT BETA HCG BLOOD, ED (NOT ORDERABLE): I-stat hCG, quantitative: <5 m[IU]/mL (ref <5)

## 2017-01-02 LAB — CG4 I-STAT (LACTIC ACID)
LACTIC ACID, VENOUS: 0.87 mmol/L (ref 0.5–1.9)
Lactic Acid, Venous: 0.95 mmol/L (ref 0.5–1.9)

## 2017-01-02 MED ORDER — KETOROLAC TROMETHAMINE 30 MG/ML IJ SOLN
15.0000 mg | Freq: Once | INTRAMUSCULAR | Status: AC
  Administered 2017-01-02: 15 mg via INTRAVENOUS
  Filled 2017-01-02: qty 1

## 2017-01-02 MED ORDER — IOPAMIDOL (ISOVUE-370) INJECTION 76%
100.0000 mL | Freq: Once | INTRAVENOUS | Status: AC | PRN
  Administered 2017-01-02: 100 mL via INTRAVENOUS

## 2017-01-02 MED ORDER — GI COCKTAIL ~~LOC~~
30.0000 mL | Freq: Once | ORAL | Status: AC
  Administered 2017-01-02: 30 mL via ORAL
  Filled 2017-01-02: qty 30

## 2017-01-02 MED ORDER — SODIUM CHLORIDE 0.9 % IV BOLUS (SEPSIS)
500.0000 mL | Freq: Once | INTRAVENOUS | Status: AC
  Administered 2017-01-02: 500 mL via INTRAVENOUS

## 2017-01-02 MED ORDER — IOPAMIDOL (ISOVUE-370) INJECTION 76%
INTRAVENOUS | Status: AC
  Filled 2017-01-02: qty 100

## 2017-01-02 MED ORDER — OMEPRAZOLE 20 MG PO CPDR: 20.0000 mg | DELAYED_RELEASE_CAPSULE | Freq: Every day | ORAL | 0 refills | Status: DC

## 2017-01-02 NOTE — ED Provider Notes (Signed)
[facility] DEPT Provider Note   CSN: [id number] Arrival date & time: 01/01/17  2317     History   Chief Complaint Chief Complaint  Patient presents with  . Shortness of Breath  . Headache  . Abdominal Pain    HPI Marilyn [name] is a 51 y.o. female.  The history is provided by the patient.  Chest Pain   This is a new problem. The current episode started more than 2 days ago. The problem occurs constantly. The problem has not changed since onset.The pain is associated with rest. The pain is present in the substernal region. The pain is moderate. The quality of the pain is described as dull. The pain does not radiate. Pertinent negatives include no abdominal pain, no diaphoresis, no dizziness, no exertional chest pressure, no fever, no headaches, no hemoptysis, no nausea, no palpitations, no shortness of breath and no sputum production. She has tried nothing for the symptoms. The treatment provided no relief. Risk factors include obesity.  Pertinent negatives for past medical history include no Marfan's syndrome and no TIA.  Pertinent negatives for family medical history include: no aortic dissection.  Procedure history is negative for cardiac catheterization.    Past Medical History:  Diagnosis Date  . Asthma   . Diabetes mellitus   . Hypercholesteremia   . Hypertension   . Lung cancer ([place])   . Neuropathy   . Obese     Patient Active Problem List   Diagnosis Date Noted  . 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 Acute systolic heart failure ([place]) 12/01/2016  . Primary cancer of right lung metastatic to other site ([place]) 11/24/2016  . SOB (shortness of breath) 10/26/2016  . Hypothyroidism 08/22/2016  . PICC (peripherally inserted central catheter) flush 07/16/2016  . Community acquired pneumonia   . Sinus tachycardia   . Thrush of mouth and esophagus ([place]) 04/07/2016  . Constipation 04/07/2016  . Palliative care by specialist   . Port catheter in place 01/19/2016    . Dehydration 01/14/2016  . Tachycardia 01/14/2016  . Adenocarcinoma of left lung, stage 3 ([place]) 01/01/2016  . Non-small cell lung cancer ([place]) 12/31/2015  . Mediastinal mass   . Chest pain 12/26/2015  . Headache 12/26/2015  . Diabetes mellitus ([place]) 12/26/2015    Past Surgical History:  Procedure Laterality Date  . CESAREAN SECTION    . IR GENERIC HISTORICAL  01/08/2016   IR US GUIDE VASC ACCESS RIGHT 01/08/2016 [name], DO [facility]  . IR GENERIC HISTORICAL  01/08/2016   IR FLUORO GUIDE CV LINE RIGHT 01/08/2016 [name], DO [facility]  . TUBAL LIGATION    . US guided core needle biopsy     of right lower neck/supraclavicular lymph nodes    OB History    No data available       Home Medications    Prior to Admission medications   Medication Sig Start Date End Date Taking? Authorizing Provider  aspirin EC 81 MG tablet Take 1 tablet (81 mg total) by mouth daily. 07/26/16  Yes [name], MD  benzonatate (TESSALON) 100 MG capsule Take 1 capsule (100 mg total) by mouth 3 (three) times daily as needed for cough. 11/01/16  Yes [name], MD  carvedilol (COREG) 6.25 MG tablet Take 1 tablet (6.25 mg total) by mouth 2 (two) times daily. 12/01/16  Yes [name], [name], MD  DULoxetine (CYMBALTA) 60 MG capsule Take 1 capsule (60 mg total) by mouth daily. 09/20/16  Yes [name], MD  insulin aspart (NOVOLOG) 100 UNIT/ML FlexPen As per sliding scale instructions Patient taking differently: Inject 0-20 Units into the skin 3 (three) times daily as needed for high blood sugar. As per sliding scale instructions 01/16/16  Yes [name], MD  Insulin Degludec (TRESIBA FLEXTOUCH) 200 UNIT/ML SOPN Inject 100 Units into the skin daily before breakfast.    Yes [provider]  levothyroxine (SYNTHROID, LEVOTHROID) 125 MCG tablet Take 1 tablet (125 mcg total) by mouth daily before breakfast. 11/15/16  Yes [name], MD  loratadine  (CLARITIN) 10 MG tablet Take 10 mg by mouth daily.   Yes [provider]  oxyCODONE-acetaminophen (PERCOCET/ROXICET) 5-325 MG tablet Take 1-2 tablets by mouth every 6 (six) hours as needed for moderate pain or severe pain. 12/22/16  Yes [name], MD  polyethylene glycol [facility]) packet Take 17 g by mouth daily. 05/24/16  Yes [name], MD  senna-docusate (SENNA S) 8.6-50 MG tablet Take 2 tablets by mouth 2 (two) times daily. May reduce to 2 tab po HS as needed once regular BM estabished 05/24/16  Yes [name], MD  omeprazole (PRILOSEC) 20 MG capsule Take 1 capsule (20 mg total) by mouth daily. 01/02/17   Loriann Bosserman, MD    Family History Family History  Problem Relation Age of Onset  . Heart failure Mother   . Hypertension Mother   . Cancer Neg Hx   . Rheumatologic disease Neg Hx     Social History Social History  Substance Use Topics  . Smoking status: Never Smoker  . Smokeless tobacco: Never Used  . Alcohol use No     Allergies   Patient has no known allergies.   Review of Systems Review of Systems  Constitutional: Negative for diaphoresis and fever.  Respiratory: Negative for hemoptysis, sputum production and shortness of breath.   Cardiovascular: Positive for chest pain. Negative for palpitations and leg swelling.  Gastrointestinal: Negative for abdominal pain and nausea.  Neurological: Negative for dizziness and headaches.  All other systems reviewed and are negative.    Physical Exam Updated Vital Signs BP 122/84   Pulse (!) 103   Temp 98.2 F (36.8 C) (Oral)   Resp (!) 0   SpO2 97%   Physical Exam  Constitutional: She is oriented to person, place, and time. She appears well-developed and well-nourished. No distress.  HENT:  Head: Normocephalic and atraumatic.  Nose: Nose normal.  Mouth/Throat: No oropharyngeal exudate.  Eyes: Pupils are equal, round, and reactive to light. Conjunctivae are normal.  Neck: Normal  range of motion. Neck supple. No JVD present.  Cardiovascular: Normal rate, regular rhythm, normal heart sounds and intact distal pulses.   Pulmonary/Chest: Effort normal and breath sounds normal. No stridor. She has no wheezes. She has no rales.  Abdominal: Soft. Bowel sounds are normal. She exhibits no mass. There is no tenderness. There is no rebound and no guarding.  Musculoskeletal: Normal range of motion. She exhibits no edema, tenderness or deformity.  Neurological: She is alert and oriented to person, place, and time. She displays normal reflexes. Coordination normal.  Skin: Skin is warm and dry. Capillary refill takes less than 2 seconds.  Psychiatric: She has a normal mood and affect.     ED Treatments / Results    (all labs ordered are listed, but only abnormal results are displayed)  Results for orders placed or performed during the hospital encounter of 01/01/17  Lipase, blood  Result Value Ref Range   Lipase 19 11 - 51 U/L  Comprehensive metabolic panel  Result Value Ref Range   Sodium 137 135 - 145 mmol/L   Potassium 3.7 3.5 - 5.1 mmol/L   Chloride 101 101 - 111 mmol/L   CO2 28 22 - 32 mmol/L   Glucose, Bld 217 (H) 65 - 99 mg/dL   BUN 11 6 - 20 mg/dL   Creatinine, Ser 0.65 0.44 - 1.00 mg/dL   Calcium 8.3 (L) 8.9 - 10.3 mg/dL   Total Protein 6.8 6.5 - 8.1 g/dL   Albumin 2.9 (L) 3.5 - 5.0 g/dL   AST 17 15 - 41 U/L   ALT 13 (L) 14 - 54 U/L   Alkaline Phosphatase 86 38 - 126 U/L   Total Bilirubin 0.4 0.3 - 1.2 mg/dL   GFR calc non Af Amer >60 >60 mL/min   GFR calc Af Amer >60 >60 mL/min   Anion gap 8 5 - 15  CBC  Result Value Ref Range   WBC 5.4 4.0 - 10.5 K/uL   RBC 2.96 (L) 3.87 - 5.11 MIL/uL   Hemoglobin 8.8 (L) 12.0 - 15.0 g/dL   HCT 26.7 (L) 36.0 - 46.0 %   MCV 90.2 78.0 - 100.0 fL   MCH 29.7 26.0 - 34.0 pg   MCHC 33.0 30.0 - 36.0 g/dL   RDW 12.8 11.5 - 15.5 %   Platelets 308 150 - 400 K/uL  CG4 I-STAT (Lactic acid)  Result Value Ref Range   Lactic  Acid, Venous 0.95 0.5 - 1.9 mmol/L  I-Stat beta hCG blood, ED  Result Value Ref Range   I-stat hCG, quantitative <5.0 <5 mIU/mL   Comment 3          CG4 I-STAT (Lactic acid)  Result Value Ref Range   Lactic Acid, Venous 0.87 0.5 - 1.9 mmol/L  POCT i-Stat troponin I  Result Value Ref Range   Troponin i, poc 0.01 0.00 - 0.08 ng/mL   Comment 3           Dg Chest 2 View  Result Date: 01/02/2017 CLINICAL DATA:  Chest pain and shortness of breath. EXAM: CHEST  2 VIEW COMPARISON:  Radiographs and CTA 10/29/2016 FINDINGS: Tip of the right upper extremity central line in the mid SVC. Chronic volume loss in the right hemithorax with right perihilar opacity, stable. Unchanged heart size and mediastinal contours. No focal consolidation, pleural fluid or pneumothorax. IMPRESSION: Unchanged appearance of the chest. No evidence of acute abnormality. Electronically Signed   By: Jeb Levering M.D.   On: 01/02/2017 00:47   Ct Angio Chest Pe W And/or Wo Contrast  Result Date: 01/02/2017 CLINICAL DATA:  Shortness of breath. Active treatment for lung cancer. EXAM: CT ANGIOGRAPHY CHEST WITH CONTRAST TECHNIQUE: Multidetector CT imaging of the chest was performed using the standard protocol during bolus administration of intravenous contrast. Multiplanar CT image reconstructions and MIPs were obtained to evaluate the vascular anatomy. CONTRAST:  100 cc Isovue 370 IV COMPARISON:  Chest radiograph earlier this day. Multiple prior chest CT, most recently 10/29/2016 FINDINGS: Cardiovascular: There are no filling defects within the pulmonary arteries to suggest pulmonary embolus. Attenuated right pulmonary arteries consistent with treatment change. Aortic atherosclerosis without aneurysm or dissection. Heart is normal in size. Similar pericardial effusion measuring up to 8 mm. Tip of the right central line in the distal SVC. Mediastinum/Nodes: Precarinal soft tissue density measuring 3 mm is unchanged allowing for  differences in caliper  placement. 10 mm subcarinal node is similar, difficult to differentiate from adjacent opacity. No new mediastinal or hilar adenopathy. The esophagus is decompressed. Visualized thyroid gland is normal. Lungs/Pleura: Stable predominantly paramediastinal postradiation change in the right lung. The left lung is clear. No new pulmonary nodule. Minimal mucous in the right mainstem bronchus no pleural fluid. Upper Abdomen: No acute abnormality. Musculoskeletal: No lytic or blastic lesions. Stable degenerative change in the thoracic spine. Review of the MIP images confirms the above findings. IMPRESSION: 1. No pulmonary embolus. 2. No acute abnormality. Stable posttreatment related changes in the right lung. Stable precarinal mediastinal mass/node. Electronically Signed   By: Jeb Levering M.D.   On: 01/02/2017 02:52    Radiology Dg Chest 2 View  Result Date: 01/02/2017 CLINICAL DATA:  Chest pain and shortness of breath. EXAM: CHEST  2 VIEW COMPARISON:  Radiographs and CTA 10/29/2016 FINDINGS: Tip of the right upper extremity central line in the mid SVC. Chronic volume loss in the right hemithorax with right perihilar opacity, stable. Unchanged heart size and mediastinal contours. No focal consolidation, pleural fluid or pneumothorax. IMPRESSION: Unchanged appearance of the chest. No evidence of acute abnormality. Electronically Signed   By: Jeb Levering M.D.   On: 01/02/2017 00:47   Ct Angio Chest Pe W And/or Wo Contrast  Result Date: 01/02/2017 CLINICAL DATA:  Shortness of breath. Active treatment for lung cancer. EXAM: CT ANGIOGRAPHY CHEST WITH CONTRAST TECHNIQUE: Multidetector CT imaging of the chest was performed using the standard protocol during bolus administration of intravenous contrast. Multiplanar CT image reconstructions and MIPs were obtained to evaluate the vascular anatomy. CONTRAST:  100 cc Isovue 370 IV COMPARISON:  Chest radiograph earlier this day. Multiple prior  chest CT, most recently 10/29/2016 FINDINGS: Cardiovascular: There are no filling defects within the pulmonary arteries to suggest pulmonary embolus. Attenuated right pulmonary arteries consistent with treatment change. Aortic atherosclerosis without aneurysm or dissection. Heart is normal in size. Similar pericardial effusion measuring up to 8 mm. Tip of the right central line in the distal SVC. Mediastinum/Nodes: Precarinal soft tissue density measuring 3 mm is unchanged allowing for differences in caliper placement. 10 mm subcarinal node is similar, difficult to differentiate from adjacent opacity. No new mediastinal or hilar adenopathy. The esophagus is decompressed. Visualized thyroid gland is normal. Lungs/Pleura: Stable predominantly paramediastinal postradiation change in the right lung. The left lung is clear. No new pulmonary nodule. Minimal mucous in the right mainstem bronchus no pleural fluid. Upper Abdomen: No acute abnormality. Musculoskeletal: No lytic or blastic lesions. Stable degenerative change in the thoracic spine. Review of the MIP images confirms the above findings. IMPRESSION: 1. No pulmonary embolus. 2. No acute abnormality. Stable posttreatment related changes in the right lung. Stable precarinal mediastinal mass/node. Electronically Signed   By: Jeb Levering M.D.   On: 01/02/2017 02:52    Procedures Procedures (including critical care time)  Medications Ordered in ED Medications  iopamidol (ISOVUE-370) 76 % injection (not administered)  iopamidol (ISOVUE-370) 76 % injection 100 mL (100 mLs Intravenous Contrast Given 01/02/17 0203)  ketorolac (TORADOL) 30 MG/ML injection 15 mg (15 mg Intravenous Given 01/02/17 0335)  gi cocktail (Maalox,Lidocaine,Donnatal) (30 mLs Oral Given 01/02/17 0335)  sodium chloride 0.9 % bolus 500 mL (500 mLs Intravenous New Bag/Given 01/02/17 0507)      Final Clinical Impressions(s) / ED Diagnoses   Final diagnoses:  Precordial pain  Patient  is painfree post GI cocktail.  Ruled out for MI and PE in the ED.  Follow  up with your oncologist and PMD.  Strict return precautions for shortness of breath, exertional chest pain, swelling or the lips or tongue, chest pain, dyspnea on exertion, new weakness or numbness changes in vision or speech,  Inability to tolerate liquids or food, changes in voice cough, altered mental status or any concerns. No signs of systemic illness or infection. The patient is nontoxic-appearing on exam and vital signs are within normal limits.    I have reviewed the triage vital signs and the nursing notes. Pertinent labs &imaging results that were available during my care of the patient were reviewed by me and considered in my medical decision making (see chart for details).  After history, exam, and medical workup I feel the patient has been appropriately medically screened and is safe for discharge home. Pertinent diagnoses were discussed with the patient. Patient was given return precautions.      New Prescriptions New Prescriptions   OMEPRAZOLE (PRILOSEC) 20 MG CAPSULE    Take 1 capsule (20 mg total) by mouth daily.     Briahnna Harries, MD 01/02/17 [id number]

## 2017-01-02 NOTE — ED Notes (Signed)
Patient is aware that urine specimen has been ordered and will notify staff when they are able to give a urine specimen.

## 2017-01-03 ENCOUNTER — Ambulatory Visit (HOSPITAL_BASED_OUTPATIENT_CLINIC_OR_DEPARTMENT_OTHER): Payer: Medicaid Other

## 2017-01-03 DIAGNOSIS — C3491 Malignant neoplasm of unspecified part of right bronchus or lung: Secondary | ICD-10-CM | POA: Diagnosis not present

## 2017-01-03 DIAGNOSIS — Z452 Encounter for adjustment and management of vascular access device: Secondary | ICD-10-CM | POA: Diagnosis not present

## 2017-01-03 DIAGNOSIS — C770 Secondary and unspecified malignant neoplasm of lymph nodes of head, face and neck: Secondary | ICD-10-CM | POA: Diagnosis not present

## 2017-01-03 MED ORDER — HEPARIN SOD (PORK) LOCK FLUSH 100 UNIT/ML IV SOLN
250.0000 [IU] | Freq: Once | INTRAVENOUS | Status: AC
  Administered 2017-01-03: 250 [IU]
  Filled 2017-01-03: qty 5

## 2017-01-03 MED ORDER — SODIUM CHLORIDE 0.9% FLUSH
10.0000 mL | Freq: Once | INTRAVENOUS | Status: AC
  Administered 2017-01-03: 10 mL
  Filled 2017-01-03: qty 10

## 2017-01-05 ENCOUNTER — Ambulatory Visit (HOSPITAL_BASED_OUTPATIENT_CLINIC_OR_DEPARTMENT_OTHER): Payer: Medicaid Other

## 2017-01-05 DIAGNOSIS — C3491 Malignant neoplasm of unspecified part of right bronchus or lung: Secondary | ICD-10-CM

## 2017-01-05 DIAGNOSIS — C770 Secondary and unspecified malignant neoplasm of lymph nodes of head, face and neck: Secondary | ICD-10-CM | POA: Diagnosis not present

## 2017-01-05 DIAGNOSIS — Z452 Encounter for adjustment and management of vascular access device: Secondary | ICD-10-CM | POA: Diagnosis not present

## 2017-01-05 DIAGNOSIS — Z95828 Presence of other vascular implants and grafts: Secondary | ICD-10-CM

## 2017-01-05 MED ORDER — SODIUM CHLORIDE 0.9 % IJ SOLN
10.0000 mL | INTRAMUSCULAR | Status: DC | PRN
  Administered 2017-01-05: 10 mL via INTRAVENOUS
  Filled 2017-01-05: qty 10

## 2017-01-05 MED ORDER — HEPARIN SOD (PORK) LOCK FLUSH 100 UNIT/ML IV SOLN
500.0000 [IU] | Freq: Once | INTRAVENOUS | Status: AC | PRN
  Administered 2017-01-05: 250 [IU] via INTRAVENOUS
  Filled 2017-01-05: qty 5

## 2017-01-05 NOTE — Progress Notes (Signed)
Marilyn [name]    HEMATOLOGY/ONCOLOGY CLINIC NOTE  Date of Service: 01/10/2017   Patient Care Team: [name], FNP as PCP - General (Nurse Practitioner)  CHIEF COMPLAINTS/PURPOSE OF CONSULTATION:  f/u Lung Adenocarcinoma.  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  Current treatment: -Started Maintainence Durvalumab from  05/24/2016 q2weeks, switched to Nivolumab q2weeks on 12/13/16 once metastatic disease noted -s/p palliative RT to rt sided Subcutaneous metastatic mass.  PreviousTreatment -Concurrent Chemo-radiation with Carboplatin/Taxol. Completed RT on 02/13/2016 and has then completed 2 cycles of carboplatin + taxol.  HISTORY OF PRESENTING ILLNESS: Plz see my previous note for details on initial presentation.  INTERVAL HISTORY   Pt presents to the office today for followup of her metastatic lung cancer. She reports that she is doing well overall. She reports that she has been taking 117mg levothyroxine regularly for 2 weeks and as overall tolerated this well. She did have some fatigue initially, but this ihas now improved after completing her RT and since she has been regularly taking her levothyroxine.   She states that she is now more able to move around the house and her fatigue has improved. She went to the ED on 9/29 due to fatigue, weakness, and chest pain; her symptoms were evaluated and she was discharged without admission following resolution of her pain with GI cocktail. She reports that she is still currently using OTC hydrocortisone for the rash on her lateral right arm which she states is not spreading. Pt states that she is now only needing percocet BID for her pain; she previously had to take this TID for her pain but this has been improving. She states that she found transportation and help for her son at home. She expressed that she needs some dental work done.   On review of systems, pt reports mild fatigue, she denies chest  pain, loss of appetite, SOB, HA and any other accompanying symptoms.  MEDICAL HISTORY:  Past Medical History:  Diagnosis Date  . Asthma   . Diabetes mellitus   . Hypercholesteremia   . Hypertension   . [place]   . Neuropathy   . Obese     SURGICAL HISTORY: Past Surgical History:  Procedure Laterality Date  . CESAREAN SECTION    . IR GENERIC HISTORICAL  01/08/2016   IR [place] VASC ACCESS RIGHT 01/08/2016 JCorrie Mckusick DO [facility]  . IR GENERIC HISTORICAL  01/08/2016   IR FLUORO GUIDE CV LINE RIGHT 01/08/2016 JCorrie Mckusick DO [facility]  . TUBAL LIGATION    . [place] core needle biopsy     of right lower neck/supraclavicular lymph nodes    SOCIAL HISTORY: Social History   Social History  . Marital status: Divorced    Spouse name: N/A  . Number of children: N/A  . Years of education: N/A   Occupational History  . Not on file.   Social History Main Topics  . Smoking status: Never Smoker  . Smokeless tobacco: Never Used  . Alcohol use No  . Drug use: No  . Sexual activity: Not Currently   Other Topics Concern  . Not on file   Social History Narrative   No bird or mold exposure. No recent travel.    FAMILY HISTORY: Family History  Problem Relation Age of Onset  . Heart failure Mother   . Hypertension Mother   . Cancer Neg Hx   . Rheumatologic disease Neg Hx     ALLERGIES:  has No  Known Allergies.  MEDICATIONS:  Current Outpatient Prescriptions  Medication Sig Dispense Refill  . aspirin EC 81 MG tablet Take 1 tablet (81 mg total) by mouth daily. 60 tablet 2  . benzonatate (TESSALON) 100 MG capsule Take 1 capsule (100 mg total) by mouth 3 (three) times daily as needed for cough. 60 capsule 0  . carvedilol (COREG) 6.25 MG tablet Take 1 tablet (6.25 mg total) by mouth 2 (two) times daily. 180 tablet 3  . DULoxetine (CYMBALTA) 60 MG capsule Take 1 capsule (60 mg total) by mouth daily. 30 capsule 2  . insulin aspart (NOVOLOG) 100 UNIT/ML  FlexPen As per sliding scale instructions (Patient taking differently: Inject 0-20 Units into the skin 3 (three) times daily as needed for high blood sugar. As per sliding scale instructions) 15 mL 2  . Insulin Degludec (TRESIBA FLEXTOUCH) 200 UNIT/ML SOPN Inject 100 Units into the skin daily before breakfast.     . levothyroxine (SYNTHROID, LEVOTHROID) 125 MCG tablet Take 1 tablet (125 mcg total) by mouth daily before breakfast. 30 tablet 2  . loratadine (CLARITIN) 10 MG tablet Take 10 mg by mouth daily.    Marilyn [name] oxyCODONE-acetaminophen (PERCOCET/ROXICET) 5-325 MG tablet Take 1-2 tablets by mouth every 6 (six) hours as needed for moderate pain or severe pain. 40 tablet 0  . polyethylene glycol (MIRALAX) packet Take 17 g by mouth daily. 30 each 1  . senna-docusate (SENNA S) 8.6-50 MG tablet Take 2 tablets by mouth 2 (two) times daily. May reduce to 2 tab po HS as needed once regular BM estabished 60 tablet 1  . omeprazole (PRILOSEC) 20 MG capsule Take 1 capsule (20 mg total) by mouth daily. (Patient not taking: Reported on 01/10/2017) 30 capsule 0   No current facility-administered medications for this visit.    Facility-Administered Medications Ordered in Other Visits  Medication Dose Route Frequency Provider Last Rate Last Dose  . 0.9 %  sodium chloride infusion   Intravenous Once [name], MD      . heparin lock flush 100 unit/mL  500 Units Intracatheter Once PRN [name], MD      . nivolumab (OPDIVO) 240 mg in sodium chloride 0.9 % 100 mL chemo infusion  240 mg Intravenous Once [name], MD      . sodium chloride 0.9 % injection 10 mL  10 mL Intravenous PRN [name], MD   10 mL at 05/24/16 1252  . sodium chloride flush (NS) 0.9 % injection 10 mL  10 mL Intracatheter PRN [name], MD        REVIEW OF SYSTEMS:    10 Point review of Systems was done is negative except as noted above.  PHYSICAL EXAMINATION: ECOG PERFORMANCE STATUS: 1 -  Symptomatic but completely ambulatory  . Vitals:   01/10/17 0936  BP: 125/80  Pulse: (!) 118  Resp: 20  Temp: 98.2 F (36.8 C)  SpO2: 100%   Filed Weights   01/10/17 0936  Weight: 278 lb 9.6 oz (126.4 kg)   .Body mass index is 40.55 kg/m.  GENERAL:alert, in no acute distress and comfortable SKIN: radiation related skin injury over neck - healing EYES: normal, conjunctiva are pink and non-injected, sclera clear OROPHARYNX:oral thrush noted NECK: supple, no JVD, thyroid normal size, non-tender, without nodularity LYMPH:  no palpable lymphadenopathy in the cervical, axillary or inguinal.             LUNGS: clear to auscultation with normal respiratory effort. Distant  breath sounds  HEART: regular rate & rhythm,  no murmurs and no lower extremity edema ABDOMEN: abdomen obese, soft, non-tender, normoactive bowel sounds , rt flank subcutaneous mass palpable. Musculoskeletal: no cyanosis of digits and no clubbing  PSYCH: alert & oriented x 3 with fluent speech NEURO: no focal motor/sensory deficits  LABORATORY DATA: .  Marilyn [name] CBC Latest Ref Rng & Units 01/10/2017 01/02/2017 12/22/2016  WBC 3.9 - 10.3 10e3/uL 4.6 5.4 6.3  Hemoglobin 11.6 - 15.9 g/dL 9.2(L) 8.8(L) 9.1(L)  Hematocrit 34.8 - 46.6 % 28.6(L) 26.7(L) 27.5(L)  Platelets 145 - 400 10e3/uL 238 308 291   . CMP Latest Ref Rng & Units 01/10/2017 01/02/2017 12/22/2016  Glucose 70 - 140 mg/dl 227(H) 217(H) 230(H)  BUN 7.0 - 26.0 mg/dL 7.4 11 9.0  Creatinine 0.6 - 1.1 mg/dL 0.8 0.65 0.9  Sodium 136 - 145 mEq/L 139 137 136  Potassium 3.5 - 5.1 mEq/L 4.1 3.7 3.8  Chloride 101 - 111 mmol/L - 101 -  CO2 22 - 29 mEq/L _0 Calcium 8.4 - 10.4 mg/dL 9.5 8.3(L) 9.5  Total Protein 6.4 - 8.3 g/dL 7.7 6.8 7.8  Total Bilirubin 0.20 - 1.20 mg/dL 0.24 0.4 0.34  Alkaline Phos 40 - 150 U/L 92 86 87  AST 5 - 34 U/L _1 ALT 0 - 55 U/L 9 13(L) 7                RADIOGRAPHIC STUDIES:  .Dg Chest 2 View  Result Date:  01/02/2017 CLINICAL DATA:  Chest pain and shortness of breath. EXAM: CHEST  2 VIEW COMPARISON:  Radiographs and CTA 10/29/2016 FINDINGS: Tip of the right upper extremity central line in the mid SVC. Chronic volume loss in the right hemithorax with right perihilar opacity, stable. Unchanged heart size and mediastinal contours. No focal consolidation, pleural fluid or pneumothorax. IMPRESSION: Unchanged appearance of the chest. No evidence of acute abnormality. Electronically Signed   By: Jeb Levering M.D.   On: 01/02/2017 00:47   Ct Angio Chest Pe W And/or Wo Contrast  Result Date: 01/02/2017 CLINICAL DATA:  Shortness of breath. Active treatment for lung cancer. EXAM: CT ANGIOGRAPHY CHEST WITH CONTRAST TECHNIQUE: Multidetector CT imaging of the chest was performed using the standard protocol during bolus administration of intravenous contrast. Multiplanar CT image reconstructions and MIPs were obtained to evaluate the vascular anatomy. CONTRAST:  100 cc Isovue 370 IV COMPARISON:  Chest radiograph earlier this day. Multiple prior chest CT, most recently 10/29/2016 FINDINGS: Cardiovascular: There are no filling defects within the pulmonary arteries to suggest pulmonary embolus. Attenuated right pulmonary arteries consistent with treatment change. Aortic atherosclerosis without aneurysm or dissection. Heart is normal in size. Similar pericardial effusion measuring up to 8 mm. Tip of the right central line in the distal SVC. Mediastinum/Nodes: Precarinal soft tissue density measuring 3 mm is unchanged allowing for differences in caliper placement. 10 mm subcarinal node is similar, difficult to differentiate from adjacent opacity. No new mediastinal or hilar adenopathy. The esophagus is decompressed. Visualized thyroid gland is normal. Lungs/Pleura: Stable predominantly paramediastinal postradiation change in the right lung. The left lung is clear. No new pulmonary nodule. Minimal mucous in the right mainstem  bronchus no pleural fluid. Upper Abdomen: No acute abnormality. Musculoskeletal: No lytic or blastic lesions. Stable degenerative change in the thoracic spine. Review of the MIP images confirms the above findings. IMPRESSION: 1. No pulmonary embolus. 2. No acute abnormality. Stable posttreatment related changes in the right lung. Stable  precarinal mediastinal mass/node. Electronically Signed   By: Jeb Levering M.D.   On: 01/02/2017 02:52     ASSESSMENT & PLAN:   51 year old African-American female with history of hypertension, diabetes, asthma, dysuria mass and morbid obesity with   #1 h/o Lung Adenocarcinoma Rt sided atleast Stage IIIB (on diagnosis) with large right paratracheal mass with mediastinal adenopathy that appears to have grown significantly over the last 6-7 months and rt supraclavicular LN +.  -Noted to have a small mass in the left adrenal on CT but PET/CT neg for metastatic disease. Patient has been a lifelong nonsmoker -MRI of the brain was negative for any metastatic disease. At diagnosis. -High PDL1 expression (90%) on foundation One Neg for EGFR, ALK, ROS-1 and BRAF mutations. -Patient completed her planned definitive chemoradiation with carbo Taxol on 02/13/2016. No prohibitive toxicities other than some grade 1 skin desquamation and some grade 1-2 radiation esophagitis. She has subsequently received 2 out of 2 planned dose of carboplatin + Taxol. -Patient has completed 11 doses of maintenance   -CTA chest 10/29/2016  no evidence of lung cancer progression in the chest. No PE -CT abd/pelvis-10/29/2016  Interval 5.0 x 5.0 x 4.7 cm mass in the right lateral subcutaneous fat at the level of the mid abdomen. This has CT features compatible with a metastasis or primary neoplasm.  #2 Now with Metastatic lung adenocarcinoma with isolated metastases in the right lateral subcutaneous fat at the level of the midabdomen. she received maintainence Durvalumab from  05/24/2016 q2weeks,  switched to Nivolumab q2weeks on 12/13/16 in the setting of Biopsy proven isolated metastatic disease. Given the tumor is strongly PDL1 positive and patient has only isolated metastatic disease will  Plan -lab stable , FT4 levels improved with improved fatigue. -continue Nivolumab at this time in the absence of more significant evidence of disease progression. - Continue when necessary Percocet for pain management  -She has seen radiation oncology and has recently completed palliative radiation to her metastatic subcutaneous mass and notes decrease in size of this lesion and decreased pain.  #3 h/o radiation pneumonitis - no new symptoms  #4 Improving fatigue (grade 1)  -working with PT once weekly and encourage to walk as much as possible  - optimize DM2 management  - improved fatigue with levothyroxine compliance with near normalization of FT4 levels - Continue Levolthyroxine 114mg ---will likely need larger dose but in the setting of baseline tachycardia we are gradually increasing dose to avoid ppt cardiac arrhythmias.  #5 normocytic anemia related to chemotherapy -resolving and stable Hg at 9.2  #6 Left chest wall pain due to costochondritis - mx with pain medications. CTA x 2 neg for PE   #7 Sinus tachycardia - on BB per cards , no PE on CTA x 2  -Still having sinus tachycardia . Partly from deconditioning and anemia. Seeing Dr. HDebara Pickettfrom cardiology   #8 neoplasm related pain is currently controlled without significant medications. Clinically likely has sleep apnea . Would need to be careful with sedative medications . -recommended sleep study with PCP  #9 hypertension  diabetes -uncontrolled  Dyslipidemia  Asthma - insulin SS for uncontrolled hyperglycemia in the setting of steroid use with chemotherapy. 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 No focal motor deficits. Given concern  for metastatic lesion and abdominal wall would also rule out metastatic disease to the brain in the setting of her headaches. -MRI Brain w and wo contrast done - no evidence of metastatic disease -- headache now resolved -- likely tension headache.   Continue Nivolumab q2weeks- please schedule next 4 treatments Labs q2eeks RTC with Dr Irene Limbo in 4wk with lab   All of the patients questions were answered with apparent satisfaction. The patient knows to call the clinic with any problems, questions or concerns.  I spent 20 minutes counseling the patient face to face. The total time spent in the appointment was 25 minutes and more than 50% was on counseling and direct patient cares.    Sullivan Lone MD [place] AAHIVMS [facility] Hematology/Oncology Physician [place]  (Office):       [phone] (Work cell):  [phone] (Fax):           [phone]   This document serves as a record of services personally performed by Sullivan Lone, MD. It was created on her behalf by Alean Rinne, a trained medical scribe. The creation of this record is based on the scribe's personal observations and the provider's statements to them. This document has been checked and approved by the attending provider.

## 2017-01-07 ENCOUNTER — Ambulatory Visit (HOSPITAL_BASED_OUTPATIENT_CLINIC_OR_DEPARTMENT_OTHER): Payer: Medicaid Other

## 2017-01-07 DIAGNOSIS — Z452 Encounter for adjustment and management of vascular access device: Secondary | ICD-10-CM

## 2017-01-07 DIAGNOSIS — C770 Secondary and unspecified malignant neoplasm of lymph nodes of head, face and neck: Secondary | ICD-10-CM | POA: Diagnosis not present

## 2017-01-07 DIAGNOSIS — C3491 Malignant neoplasm of unspecified part of right bronchus or lung: Secondary | ICD-10-CM

## 2017-01-07 DIAGNOSIS — Z95828 Presence of other vascular implants and grafts: Secondary | ICD-10-CM

## 2017-01-07 MED ORDER — HEPARIN SOD (PORK) LOCK FLUSH 100 UNIT/ML IV SOLN
500.0000 [IU] | Freq: Once | INTRAVENOUS | Status: AC | PRN
  Administered 2017-01-07: 250 [IU] via INTRAVENOUS
  Filled 2017-01-07: qty 5

## 2017-01-07 MED ORDER — SODIUM CHLORIDE 0.9 % IJ SOLN
10.0000 mL | INTRAMUSCULAR | Status: DC | PRN
  Administered 2017-01-07: 10 mL via INTRAVENOUS
  Filled 2017-01-07: qty 10

## 2017-01-10 ENCOUNTER — Ambulatory Visit (HOSPITAL_BASED_OUTPATIENT_CLINIC_OR_DEPARTMENT_OTHER): Payer: Medicaid Other

## 2017-01-10 ENCOUNTER — Telehealth: Payer: Self-pay | Admitting: Hematology

## 2017-01-10 ENCOUNTER — Other Ambulatory Visit (HOSPITAL_BASED_OUTPATIENT_CLINIC_OR_DEPARTMENT_OTHER): Payer: Medicaid Other

## 2017-01-10 ENCOUNTER — Ambulatory Visit: Payer: Medicaid Other

## 2017-01-10 ENCOUNTER — Encounter: Payer: Self-pay | Admitting: Hematology

## 2017-01-10 ENCOUNTER — Ambulatory Visit (HOSPITAL_BASED_OUTPATIENT_CLINIC_OR_DEPARTMENT_OTHER): Payer: Medicaid Other | Admitting: Hematology

## 2017-01-10 ENCOUNTER — Other Ambulatory Visit: Payer: Medicaid Other

## 2017-01-10 VITALS — BP 125/80 | HR 118 | Temp 98.2°F | Resp 20 | Ht 69.5 in | Wt 278.6 lb

## 2017-01-10 DIAGNOSIS — Z5112 Encounter for antineoplastic immunotherapy: Secondary | ICD-10-CM | POA: Diagnosis not present

## 2017-01-10 DIAGNOSIS — Z794 Long term (current) use of insulin: Secondary | ICD-10-CM | POA: Diagnosis not present

## 2017-01-10 DIAGNOSIS — E279 Disorder of adrenal gland, unspecified: Secondary | ICD-10-CM | POA: Diagnosis not present

## 2017-01-10 DIAGNOSIS — G620 Drug-induced polyneuropathy: Secondary | ICD-10-CM | POA: Diagnosis not present

## 2017-01-10 DIAGNOSIS — C770 Secondary and unspecified malignant neoplasm of lymph nodes of head, face and neck: Secondary | ICD-10-CM | POA: Diagnosis not present

## 2017-01-10 DIAGNOSIS — E1165 Type 2 diabetes mellitus with hyperglycemia: Secondary | ICD-10-CM

## 2017-01-10 DIAGNOSIS — G893 Neoplasm related pain (acute) (chronic): Secondary | ICD-10-CM | POA: Diagnosis not present

## 2017-01-10 DIAGNOSIS — C792 Secondary malignant neoplasm of skin: Secondary | ICD-10-CM

## 2017-01-10 DIAGNOSIS — R1900 Intra-abdominal and pelvic swelling, mass and lump, unspecified site: Secondary | ICD-10-CM

## 2017-01-10 DIAGNOSIS — Z79899 Other long term (current) drug therapy: Secondary | ICD-10-CM | POA: Diagnosis not present

## 2017-01-10 DIAGNOSIS — C3491 Malignant neoplasm of unspecified part of right bronchus or lung: Secondary | ICD-10-CM

## 2017-01-10 DIAGNOSIS — R5383 Other fatigue: Secondary | ICD-10-CM | POA: Diagnosis not present

## 2017-01-10 DIAGNOSIS — I10 Essential (primary) hypertension: Secondary | ICD-10-CM

## 2017-01-10 DIAGNOSIS — E032 Hypothyroidism due to medicaments and other exogenous substances: Secondary | ICD-10-CM

## 2017-01-10 LAB — CBC & DIFF AND RETIC
BASO%: 0.2 % (ref 0.0–2.0)
BASOS ABS: 0 10*3/uL (ref 0.0–0.1)
EOS%: 3.3 % (ref 0.0–7.0)
Eosinophils Absolute: 0.2 10*3/uL (ref 0.0–0.5)
HEMATOCRIT: 28.6 % — AB (ref 34.8–46.6)
HGB: 9.2 g/dL — ABNORMAL LOW (ref 11.6–15.9)
IMMATURE RETIC FRACT: 6.5 % (ref 1.60–10.00)
LYMPH#: 0.8 10*3/uL — AB (ref 0.9–3.3)
LYMPH%: 17.4 % (ref 14.0–49.7)
MCH: 29.6 pg (ref 25.1–34.0)
MCHC: 32.2 g/dL (ref 31.5–36.0)
MCV: 92 fL (ref 79.5–101.0)
MONO#: 0.3 10*3/uL (ref 0.1–0.9)
MONO%: 6.5 % (ref 0.0–14.0)
NEUT#: 3.4 10*3/uL (ref 1.5–6.5)
NEUT%: 72.6 % (ref 38.4–76.8)
PLATELETS: 238 10*3/uL (ref 145–400)
RBC: 3.11 10*6/uL — AB (ref 3.70–5.45)
RDW: 13.2 % (ref 11.2–14.5)
RETIC %: 2.2 % — AB (ref 0.70–2.10)
RETIC CT ABS: 68.42 10*3/uL (ref 33.70–90.70)
WBC: 4.6 10*3/uL (ref 3.9–10.3)

## 2017-01-10 LAB — COMPREHENSIVE METABOLIC PANEL
ALT: 9 U/L (ref 0–55)
ANION GAP: 8 meq/L (ref 3–11)
AST: 14 U/L (ref 5–34)
Albumin: 3.2 g/dL — ABNORMAL LOW (ref 3.5–5.0)
Alkaline Phosphatase: 92 U/L (ref 40–150)
BILIRUBIN TOTAL: 0.24 mg/dL (ref 0.20–1.20)
BUN: 7.4 mg/dL (ref 7.0–26.0)
CALCIUM: 9.5 mg/dL (ref 8.4–10.4)
CO2: 28 meq/L (ref 22–29)
CREATININE: 0.8 mg/dL (ref 0.6–1.1)
Chloride: 103 mEq/L (ref 98–109)
EGFR: >90 mL/min/{1.73_m2} (ref >90)
Glucose: 227 mg/dl — ABNORMAL HIGH (ref 70–140)
Potassium: 4.1 mEq/L (ref 3.5–5.1)
Sodium: 139 mEq/L (ref 136–145)
Total Protein: 7.7 g/dL (ref 6.4–8.3)

## 2017-01-10 LAB — TSH: TSH: 25.898 m(IU)/L — ABNORMAL HIGH (ref 0.308–3.960)

## 2017-01-10 MED ORDER — HEPARIN SOD (PORK) LOCK FLUSH 100 UNIT/ML IV SOLN
500.0000 [IU] | Freq: Once | INTRAVENOUS | Status: AC | PRN
  Administered 2017-01-10: 250 [IU]
  Filled 2017-01-10: qty 5

## 2017-01-10 MED ORDER — SODIUM CHLORIDE 0.9% FLUSH
10.0000 mL | INTRAVENOUS | Status: DC | PRN
  Administered 2017-01-10: 10 mL
  Filled 2017-01-10: qty 10

## 2017-01-10 MED ORDER — SODIUM CHLORIDE 0.9 % IV SOLN
Freq: Once | INTRAVENOUS | Status: AC
  Administered 2017-01-10: 11:00:00 via INTRAVENOUS

## 2017-01-10 MED ORDER — SODIUM CHLORIDE 0.9 % IV SOLN
240.0000 mg | Freq: Once | INTRAVENOUS | Status: AC
  Administered 2017-01-10: 240 mg via INTRAVENOUS
  Filled 2017-01-10: qty 24

## 2017-01-10 NOTE — Patient Instructions (Signed)
[facility] Discharge Instructions for Patients Receiving Chemotherapy  Today you received the following chemotherapy agents Opdivo  To help prevent nausea and vomiting after your treatment, we encourage you to take your nausea medication as directed   If you develop nausea and vomiting that is not controlled by your nausea medication, call the clinic.   BELOW ARE SYMPTOMS THAT SHOULD BE REPORTED IMMEDIATELY:  *FEVER GREATER THAN 100.5 F  *CHILLS WITH OR WITHOUT FEVER  NAUSEA AND VOMITING THAT IS NOT CONTROLLED WITH YOUR NAUSEA MEDICATION  *UNUSUAL SHORTNESS OF BREATH  *UNUSUAL BRUISING OR BLEEDING  TENDERNESS IN MOUTH AND THROAT WITH OR WITHOUT PRESENCE OF ULCERS  *URINARY PROBLEMS  *BOWEL PROBLEMS  UNUSUAL RASH Items with * indicate a potential emergency and should be followed up as soon as possible.  Feel free to call the clinic should you have any questions or concerns. The clinic phone number is [phone].  Please show the CHEMO ALERT CARD at check-in to the Emergency Department and triage nurse.   

## 2017-01-10 NOTE — Telephone Encounter (Signed)
Per 10/8 los - all appts were already scheduled.

## 2017-01-10 NOTE — Patient Instructions (Signed)
PICC Home Guide °A peripherally inserted central catheter (PICC) is a long, thin, flexible tube that is inserted into a vein in the upper arm. It is a form of intravenous (IV) access. It is considered to be a "central" line because the tip of the PICC ends in a large vein in your chest. This large vein is called the superior vena cava (SVC). The PICC tip ends in the SVC because there is a lot of blood flow in the SVC. This allows medicines and IV fluids to be quickly distributed throughout the body. The PICC is inserted using a sterile technique by a specially trained nurse or physician. After the PICC is inserted, a chest X-ray exam is done to be sure it is in the correct place. °A PICC may be placed for different reasons, such as: °· To give medicines and liquid nutrition that can only be given through a central line. Examples are: °? Certain antibiotic treatments. °? Chemotherapy. °? Total parenteral nutrition (TPN). °· To take frequent blood samples. °· To give IV fluids and blood products. °· If there is difficulty placing a peripheral intravenous (PIV) catheter. ° °If taken care of properly, a PICC can remain in place for several months. A PICC can also allow a person to go home from the hospital early. Medicine and PICC care can be managed at home by a family member or home health care team. °What problems can happen when I have a PICC? °Problems with a PICC can occasionally occur. These may include the following: °· A blood clot (thrombus) forming in or at the tip of the PICC. This can cause the PICC to become clogged. A clot-dissolving medicine called tissue plasminogen activator (tPA) can be given through the PICC to help break up the clot. °· Inflammation of the vein (phlebitis) in which the PICC is placed. Signs of inflammation may include redness, pain at the insertion site, red streaks, or being able to feel a "cord" in the vein where the PICC is located. °· Infection in the PICC or at the insertion  site. Signs of infection may include fever, chills, redness, swelling, or pus drainage from the PICC insertion site. °· PICC movement (malposition). The PICC tip may move from its original position due to excessive physical activity, forceful coughing, sneezing, or vomiting. °· A break or cut in the PICC. It is important to not use scissors near the PICC. °· Nerve or tendon irritation or injury during PICC insertion. ° °What should I keep in mind about activities when I have a PICC? °· You may bend your arm and move it freely. If your PICC is near or at the bend of your elbow, avoid activity with repeated motion at the elbow. °· Rest at home for the remainder of the day following PICC line insertion. °· Avoid lifting heavy objects as instructed by your health care provider. °· Avoid using a crutch with the arm on the same side as your PICC. You may need to use a walker. °What should I know about my PICC dressing? °· Keep your PICC bandage (dressing) clean and dry to prevent infection. °? Ask your health care provider when you may shower. Ask your health care provider to teach you how to wrap the PICC when you do take a shower. °· Change the PICC dressing as instructed by your health care provider. °· Change your PICC dressing if it becomes loose or wet. °What should I know about PICC care? °· Check the PICC insertion   site daily for leakage, redness, swelling, or pain. °· Do not take a bath, swim, or use hot tubs when you have a PICC. Cover PICC line with clear plastic wrap and tape to keep it dry while showering. °· Flush the PICC as directed by your health care provider. Let your health care provider know right away if the PICC is difficult to flush or does not flush. Do not use force to flush the PICC. °· Do not use a syringe that is less than 10 mL to flush the PICC. °· Never pull or tug on the PICC. °· Avoid blood pressure checks on the arm with the PICC. °· Keep your PICC identification card with you at all  times. °· Do not take the PICC out yourself. Only a trained clinical professional should remove the PICC. °Get help right away if: °· Your PICC is accidentally pulled all the way out. If this happens, cover the insertion site with a bandage or gauze dressing. Do not throw the PICC away. Your health care provider will need to inspect it. °· Your PICC was tugged or pulled and has partially come out. Do not  push the PICC back in. °· There is any type of drainage, redness, or swelling where the PICC enters the skin. °· You cannot flush the PICC, it is difficult to flush, or the PICC leaks around the insertion site when it is flushed. °· You hear a "flushing" sound when the PICC is flushed. °· You have pain, discomfort, or numbness in your arm, shoulder, or jaw on the same side as the PICC. °· You feel your heart "racing" or skipping beats. °· You notice a hole or tear in the PICC. °· You develop chills or a fever. °This information is not intended to replace advice given to you by your health care provider. Make sure you discuss any questions you have with your health care provider. °Document Released: 09/26/2002 Document Revised: 10/10/2015 Document Reviewed: 01/12/2013 °Elsevier Interactive Patient Education © 2017 [employer] ° °

## 2017-01-11 LAB — T4, FREE: FREE T4: 0.78 ng/dL — AB (ref 0.82–1.77)

## 2017-01-14 ENCOUNTER — Ambulatory Visit (HOSPITAL_BASED_OUTPATIENT_CLINIC_OR_DEPARTMENT_OTHER): Payer: Medicaid Other

## 2017-01-14 DIAGNOSIS — C770 Secondary and unspecified malignant neoplasm of lymph nodes of head, face and neck: Secondary | ICD-10-CM

## 2017-01-14 DIAGNOSIS — Z452 Encounter for adjustment and management of vascular access device: Secondary | ICD-10-CM

## 2017-01-14 DIAGNOSIS — C3491 Malignant neoplasm of unspecified part of right bronchus or lung: Secondary | ICD-10-CM | POA: Diagnosis not present

## 2017-01-14 DIAGNOSIS — Z95828 Presence of other vascular implants and grafts: Secondary | ICD-10-CM

## 2017-01-14 MED ORDER — SODIUM CHLORIDE 0.9 % IJ SOLN
10.0000 mL | INTRAMUSCULAR | Status: DC | PRN
  Administered 2017-01-14: 10 mL via INTRAVENOUS
  Filled 2017-01-14: qty 10

## 2017-01-14 MED ORDER — HEPARIN SOD (PORK) LOCK FLUSH 100 UNIT/ML IV SOLN
500.0000 [IU] | Freq: Once | INTRAVENOUS | Status: AC | PRN
  Administered 2017-01-14: 250 [IU] via INTRAVENOUS
  Filled 2017-01-14: qty 5

## 2017-01-17 ENCOUNTER — Ambulatory Visit (HOSPITAL_BASED_OUTPATIENT_CLINIC_OR_DEPARTMENT_OTHER): Payer: Medicaid Other

## 2017-01-17 DIAGNOSIS — Z452 Encounter for adjustment and management of vascular access device: Secondary | ICD-10-CM

## 2017-01-17 DIAGNOSIS — C3491 Malignant neoplasm of unspecified part of right bronchus or lung: Secondary | ICD-10-CM | POA: Diagnosis not present

## 2017-01-17 DIAGNOSIS — C770 Secondary and unspecified malignant neoplasm of lymph nodes of head, face and neck: Secondary | ICD-10-CM | POA: Diagnosis not present

## 2017-01-17 DIAGNOSIS — Z95828 Presence of other vascular implants and grafts: Secondary | ICD-10-CM

## 2017-01-17 MED ORDER — SODIUM CHLORIDE 0.9 % IJ SOLN
10.0000 mL | INTRAMUSCULAR | Status: DC | PRN
  Administered 2017-01-17: 10 mL via INTRAVENOUS
  Filled 2017-01-17: qty 10

## 2017-01-17 MED ORDER — ALTEPLASE 2 MG IJ SOLR
2.0000 mg | Freq: Once | INTRAMUSCULAR | Status: DC | PRN
  Filled 2017-01-17: qty 2

## 2017-01-17 MED ORDER — HEPARIN SOD (PORK) LOCK FLUSH 100 UNIT/ML IV SOLN
500.0000 [IU] | Freq: Once | INTRAVENOUS | Status: AC | PRN
  Administered 2017-01-17: 250 [IU] via INTRAVENOUS
  Filled 2017-01-17: qty 5

## 2017-01-19 ENCOUNTER — Ambulatory Visit (HOSPITAL_BASED_OUTPATIENT_CLINIC_OR_DEPARTMENT_OTHER): Payer: Medicaid Other

## 2017-01-19 DIAGNOSIS — C770 Secondary and unspecified malignant neoplasm of lymph nodes of head, face and neck: Secondary | ICD-10-CM | POA: Diagnosis not present

## 2017-01-19 DIAGNOSIS — Z452 Encounter for adjustment and management of vascular access device: Secondary | ICD-10-CM

## 2017-01-19 DIAGNOSIS — C3491 Malignant neoplasm of unspecified part of right bronchus or lung: Secondary | ICD-10-CM

## 2017-01-19 DIAGNOSIS — Z95828 Presence of other vascular implants and grafts: Secondary | ICD-10-CM

## 2017-01-19 MED ORDER — HEPARIN SOD (PORK) LOCK FLUSH 100 UNIT/ML IV SOLN
500.0000 [IU] | Freq: Once | INTRAVENOUS | Status: AC | PRN
  Administered 2017-01-19: 250 [IU] via INTRAVENOUS
  Filled 2017-01-19: qty 5

## 2017-01-19 MED ORDER — SODIUM CHLORIDE 0.9 % IJ SOLN
10.0000 mL | INTRAMUSCULAR | Status: DC | PRN
  Administered 2017-01-19: 10 mL via INTRAVENOUS
  Filled 2017-01-19: qty 10

## 2017-01-24 ENCOUNTER — Other Ambulatory Visit (HOSPITAL_BASED_OUTPATIENT_CLINIC_OR_DEPARTMENT_OTHER): Payer: Medicaid Other

## 2017-01-24 ENCOUNTER — Telehealth: Payer: Self-pay

## 2017-01-24 ENCOUNTER — Ambulatory Visit: Payer: Medicaid Other

## 2017-01-24 ENCOUNTER — Ambulatory Visit (HOSPITAL_BASED_OUTPATIENT_CLINIC_OR_DEPARTMENT_OTHER): Payer: Medicaid Other | Admitting: Hematology

## 2017-01-24 ENCOUNTER — Encounter: Payer: Self-pay | Admitting: Hematology

## 2017-01-24 ENCOUNTER — Ambulatory Visit (HOSPITAL_BASED_OUTPATIENT_CLINIC_OR_DEPARTMENT_OTHER): Payer: Medicaid Other

## 2017-01-24 VITALS — BP 150/87 | HR 115 | Temp 98.0°F | Resp 18 | Ht 69.5 in | Wt 278.8 lb

## 2017-01-24 DIAGNOSIS — Z5112 Encounter for antineoplastic immunotherapy: Secondary | ICD-10-CM | POA: Diagnosis not present

## 2017-01-24 DIAGNOSIS — Z95828 Presence of other vascular implants and grafts: Secondary | ICD-10-CM

## 2017-01-24 DIAGNOSIS — Z452 Encounter for adjustment and management of vascular access device: Secondary | ICD-10-CM

## 2017-01-24 DIAGNOSIS — Z794 Long term (current) use of insulin: Secondary | ICD-10-CM

## 2017-01-24 DIAGNOSIS — C792 Secondary malignant neoplasm of skin: Secondary | ICD-10-CM | POA: Diagnosis not present

## 2017-01-24 DIAGNOSIS — C3491 Malignant neoplasm of unspecified part of right bronchus or lung: Secondary | ICD-10-CM

## 2017-01-24 DIAGNOSIS — C770 Secondary and unspecified malignant neoplasm of lymph nodes of head, face and neck: Secondary | ICD-10-CM | POA: Diagnosis not present

## 2017-01-24 DIAGNOSIS — I10 Essential (primary) hypertension: Secondary | ICD-10-CM | POA: Diagnosis not present

## 2017-01-24 DIAGNOSIS — E279 Disorder of adrenal gland, unspecified: Secondary | ICD-10-CM

## 2017-01-24 DIAGNOSIS — Z23 Encounter for immunization: Secondary | ICD-10-CM

## 2017-01-24 DIAGNOSIS — E1165 Type 2 diabetes mellitus with hyperglycemia: Secondary | ICD-10-CM | POA: Diagnosis not present

## 2017-01-24 DIAGNOSIS — G893 Neoplasm related pain (acute) (chronic): Secondary | ICD-10-CM

## 2017-01-24 DIAGNOSIS — R1900 Intra-abdominal and pelvic swelling, mass and lump, unspecified site: Secondary | ICD-10-CM

## 2017-01-24 LAB — CBC & DIFF AND RETIC
BASO%: 0.2 % (ref 0.0–2.0)
BASOS ABS: 0 10*3/uL (ref 0.0–0.1)
EOS ABS: 0.2 10*3/uL (ref 0.0–0.5)
EOS%: 5.8 % (ref 0.0–7.0)
HCT: 29 % — ABNORMAL LOW (ref 34.8–46.6)
HEMOGLOBIN: 9.2 g/dL — AB (ref 11.6–15.9)
Immature Retic Fract: 6.4 % (ref 1.60–10.00)
LYMPH#: 0.9 10*3/uL (ref 0.9–3.3)
LYMPH%: 22.6 % (ref 14.0–49.7)
MCH: 28.9 pg (ref 25.1–34.0)
MCHC: 31.7 g/dL (ref 31.5–36.0)
MCV: 91.2 fL (ref 79.5–101.0)
MONO#: 0.3 10*3/uL (ref 0.1–0.9)
MONO%: 7.3 % (ref 0.0–14.0)
NEUT%: 64.1 % (ref 38.4–76.8)
NEUTROS ABS: 2.6 10*3/uL (ref 1.5–6.5)
Platelets: 299 10*3/uL (ref 145–400)
RBC: 3.18 10*6/uL — AB (ref 3.70–5.45)
RDW: 12.9 % (ref 11.2–14.5)
RETIC %: 1.93 % (ref 0.70–2.10)
Retic Ct Abs: 61.37 10*3/uL (ref 33.70–90.70)
WBC: 4.1 10*3/uL (ref 3.9–10.3)

## 2017-01-24 LAB — COMPREHENSIVE METABOLIC PANEL WITH GFR
ALT: 8 U/L (ref 0–55)
AST: 14 U/L (ref 5–34)
Albumin: 3.2 g/dL — ABNORMAL LOW (ref 3.5–5.0)
Alkaline Phosphatase: 88 U/L (ref 40–150)
Anion Gap: 8 meq/L (ref 3–11)
BUN: 8.2 mg/dL (ref 7.0–26.0)
CO2: 29 meq/L (ref 22–29)
Calcium: 9 mg/dL (ref 8.4–10.4)
Chloride: 104 meq/L (ref 98–109)
Creatinine: 0.8 mg/dL (ref 0.6–1.1)
EGFR: >60 ml/min/1.73 m2
Glucose: 80 mg/dL (ref 70–140)
Potassium: 4 meq/L (ref 3.5–5.1)
Sodium: 141 meq/L (ref 136–145)
Total Bilirubin: 0.29 mg/dL (ref 0.20–1.20)
Total Protein: 7.7 g/dL (ref 6.4–8.3)

## 2017-01-24 MED ORDER — HEPARIN SOD (PORK) LOCK FLUSH 100 UNIT/ML IV SOLN
250.0000 [IU] | Freq: Once | INTRAVENOUS | Status: AC | PRN
  Administered 2017-01-24: 250 [IU]
  Filled 2017-01-24: qty 5

## 2017-01-24 MED ORDER — SODIUM CHLORIDE 0.9 % IJ SOLN
10.0000 mL | INTRAMUSCULAR | Status: DC | PRN
  Administered 2017-01-24: 10 mL via INTRAVENOUS
  Filled 2017-01-24: qty 10

## 2017-01-24 MED ORDER — SODIUM CHLORIDE 0.9 % IV SOLN
Freq: Once | INTRAVENOUS | Status: AC
  Administered 2017-01-24: 15:00:00 via INTRAVENOUS

## 2017-01-24 MED ORDER — SODIUM CHLORIDE 0.9% FLUSH
10.0000 mL | INTRAVENOUS | Status: DC | PRN
  Administered 2017-01-24: 10 mL
  Filled 2017-01-24: qty 10

## 2017-01-24 MED ORDER — NIVOLUMAB CHEMO INJECTION 100 MG/10ML
240.0000 mg | Freq: Once | INTRAVENOUS | Status: AC
  Administered 2017-01-24: 240 mg via INTRAVENOUS
  Filled 2017-01-24: qty 24

## 2017-01-24 MED ORDER — PNEUMOCOCCAL 13-VAL CONJ VACC IM SUSP
0.5000 mL | Freq: Once | INTRAMUSCULAR | Status: AC
  Administered 2017-01-24: 0.5 mL via INTRAMUSCULAR
  Filled 2017-01-24: qty 0.5

## 2017-01-24 NOTE — Telephone Encounter (Signed)
Printed avs and calender for upcoming appointment. Per 10/22 los 

## 2017-01-24 NOTE — Patient Instructions (Signed)
[facility] Discharge Instructions for Patients Receiving Chemotherapy  Today you received the following chemotherapy agents: Nivolumab  To help prevent nausea and vomiting after your treatment, we encourage you to take your nausea medication as directed.    If you develop nausea and vomiting that is not controlled by your nausea medication, call the clinic.   BELOW ARE SYMPTOMS THAT SHOULD BE REPORTED IMMEDIATELY:  *FEVER GREATER THAN 100.5 F  *CHILLS WITH OR WITHOUT FEVER  NAUSEA AND VOMITING THAT IS NOT CONTROLLED WITH YOUR NAUSEA MEDICATION  *UNUSUAL SHORTNESS OF BREATH  *UNUSUAL BRUISING OR BLEEDING  TENDERNESS IN MOUTH AND THROAT WITH OR WITHOUT PRESENCE OF ULCERS  *URINARY PROBLEMS  *BOWEL PROBLEMS  UNUSUAL RASH Items with * indicate a potential emergency and should be followed up as soon as possible.  Feel free to call the clinic should you have any questions or concerns. The clinic phone number is [phone].  Please show the CHEMO ALERT CARD at check-in to the Emergency Department and triage nurse.   

## 2017-01-24 NOTE — Progress Notes (Signed)
Marilyn [name]    HEMATOLOGY/ONCOLOGY CLINIC NOTE  Date of Service: 01/24/2017   Patient Care Team: [name], FNP as PCP - General (Nurse Practitioner)  CHIEF COMPLAINTS/PURPOSE OF CONSULTATION:  f/u Lung Adenocarcinoma.  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  Current treatment: -Started Maintainence Durvalumab from  05/24/2016 q2weeks, switched to Nivolumab q2weeks on 12/13/16 once metastatic disease noted -s/p palliative RT to rt sided Subcutaneous metastatic mass.  PreviousTreatment -Concurrent Chemo-radiation with Carboplatin/Taxol. Completed RT on 02/13/2016 and has then completed 2 cycles of carboplatin + taxol.  HISTORY OF PRESENTING ILLNESS: Plz see my previous note for details on initial presentation.  INTERVAL HISTORY   Pt presents to the office today for followup of her metastatic lung cancer. She reports that she is doing well overall. Her energy levels have improved and she states that she has been walking 30 minutes every day. She is unable to do outpatient rehab due to insurance not covering this. She is consistently taking her levothyroxine. She states that her son will start outpatient physical therapy on the 30th of this month. No new prohibitive toxicities from Nivolumab at this time. She notes that the rt flank mass has significantly shrunk and she is able to wear her skirts and that it is not hurting now.  On review of systems, pt reports and denies skin rashes, mouth sores, diarrhea, changes in BM, abdominal pain and any other accompanying symptoms.    MEDICAL HISTORY:  Past Medical History:  Diagnosis Date  . Asthma   . Diabetes mellitus   . Hypercholesteremia   . Hypertension   . Lung cancer ([place])   . Neuropathy   . Obese     SURGICAL HISTORY: Past Surgical History:  Procedure Laterality Date  . CESAREAN SECTION    . IR GENERIC HISTORICAL  01/08/2016   IR US GUIDE VASC ACCESS RIGHT 01/08/2016 [name],  DO [facility]  . IR GENERIC HISTORICAL  01/08/2016   IR FLUORO GUIDE CV LINE RIGHT 01/08/2016 [name], DO [facility]  . TUBAL LIGATION    . US guided core needle biopsy     of right lower neck/supraclavicular lymph nodes    SOCIAL HISTORY: Social History   Social History  . Marital status: Divorced    Spouse name: N/A  . Number of children: N/A  . Years of education: N/A   Occupational History  . Not on file.   Social History Main Topics  . Smoking status: Never Smoker  . Smokeless tobacco: Never Used  . Alcohol use No  . Drug use: No  . Sexual activity: Not Currently   Other Topics Concern  . Not on file   Social History Narrative   No bird or mold exposure. No recent travel.    FAMILY HISTORY: Family History  Problem Relation Age of Onset  . Heart failure Mother   . Hypertension Mother   . Cancer Neg Hx   . Rheumatologic disease Neg Hx     ALLERGIES:  has No Known Allergies.  MEDICATIONS:  Current Outpatient Prescriptions  Medication Sig Dispense Refill  . aspirin EC 81 MG tablet Take 1 tablet (81 mg total) by mouth daily. 60 tablet 2  . benzonatate (TESSALON) 100 MG capsule Take 1 capsule (100 mg total) by mouth 3 (three) times daily as needed for cough. 60 capsule 0  . carvedilol (COREG) 6.25 MG tablet Take 1 tablet (6.25 mg total) by mouth 2 (two) times daily. 180 tablet 3  .  DULoxetine (CYMBALTA) 60 MG capsule Take 1 capsule (60 mg total) by mouth daily. 30 capsule 2  . insulin aspart (NOVOLOG) 100 UNIT/ML FlexPen As per sliding scale instructions (Patient taking differently: Inject 0-20 Units into the skin 3 (three) times daily as needed for high blood sugar. As per sliding scale instructions) 15 mL 2  . Insulin Degludec (TRESIBA FLEXTOUCH) 200 UNIT/ML SOPN Inject 100 Units into the skin daily before breakfast.     . levothyroxine (SYNTHROID, LEVOTHROID) 125 MCG tablet Take 1 tablet (125 mcg total) by mouth daily before breakfast. 30 tablet 2  .  loratadine (CLARITIN) 10 MG tablet Take 10 mg by mouth daily.    . omeprazole (PRILOSEC) 20 MG capsule Take 1 capsule (20 mg total) by mouth daily. (Patient not taking: Reported on 01/10/2017) 30 capsule 0  . oxyCODONE-acetaminophen (PERCOCET/ROXICET) 5-325 MG tablet Take 1-2 tablets by mouth every 6 (six) hours as needed for moderate pain or severe pain. 40 tablet 0  . polyethylene glycol (MIRALAX) packet Take 17 g by mouth daily. 30 each 1  . senna-docusate (SENNA S) 8.6-50 MG tablet Take 2 tablets by mouth 2 (two) times daily. May reduce to 2 tab po HS as needed once regular BM estabished 60 tablet 1   No current facility-administered medications for this visit.    Facility-Administered Medications Ordered in Other Visits  Medication Dose Route Frequency Provider Last Rate Last Dose  . sodium chloride 0.9 % injection 10 mL  10 mL Intravenous PRN Kale, Gautam Kishore, MD   10 mL at 05/24/16 1252    REVIEW OF SYSTEMS:    10 Point review of Systems was done is negative except as noted above.  PHYSICAL EXAMINATION: ECOG PERFORMANCE STATUS: 1 - Symptomatic but completely ambulatory  . Vitals:   01/24/17 1255  BP: (!) 150/87  Pulse: (!) 115  Resp: 18  Temp: 98 F (36.7 C)  SpO2: 100%   Filed Weights   01/24/17 1255  Weight: 278 lb 12.8 oz (126.5 kg)   .Body mass index is 40.58 kg/m.  GENERAL:alert, in no acute distress and comfortable SKIN: radiation related skin injury over neck - healing EYES: normal, conjunctiva are pink and non-injected, sclera clear OROPHARYNX:oral thrush noted NECK: supple, no JVD, thyroid normal size, non-tender, without nodularity LYMPH:  no palpable lymphadenopathy in the cervical, axillary or inguinal.             LUNGS: clear to auscultation with normal respiratory effort. Distant breath sounds  HEART: regular rate & rhythm,  no murmurs and no lower extremity edema ABDOMEN: abdomen obese, soft, non-tender, normoactive bowel sounds , rt flank  subcutaneous mass palpable. Musculoskeletal: no cyanosis of digits and no clubbing  PSYCH: alert & oriented x 3 with fluent speech NEURO: no focal motor/sensory deficits  LABORATORY DATA: .  . CBC Latest Ref Rng & Units 01/24/2017 01/10/2017 01/02/2017  WBC 3.9 - 10.3 10e3/uL 4.1 4.6 5.4  Hemoglobin 11.6 - 15.9 g/dL 9.2(L) 9.2(L) 8.8(L)  Hematocrit 34.8 - 46.6 % 29.0(L) 28.6(L) 26.7(L)  Platelets 145 - 400 10e3/uL 299 238 308   . CMP Latest Ref Rng & Units 01/24/2017 01/10/2017 01/02/2017  Glucose 70 - 140 mg/dl 80 227(H) 217(H)  BUN 7.0 - 26.0 mg/dL 8.2 7.4 11  Creatinine 0.6 - 1.1 mg/dL 0.8 0.8 0.65  Sodium 136 - 145 mEq/L 141 139 137  Potassium 3.5 - 5.1 mEq/L 4.0 4.1 3.7  Chloride 101 - 111 mmol/L - - 101  CO2 22 -   29 mEq/L 29 28 28  Calcium 8.4 - 10.4 mg/dL 9.0 9.5 8.3(L)  Total Protein 6.4 - 8.3 g/dL 7.7 7.7 6.8  Total Bilirubin 0.20 - 1.20 mg/dL 0.29 0.24 0.4  Alkaline Phos 40 - 150 U/L 88 92 86  AST 5 - 34 U/L 14 14 17  ALT 0 - 55 U/L 8 9 13(L)                RADIOGRAPHIC STUDIES:  .Dg Chest 2 View  Result Date: 01/02/2017 CLINICAL DATA:  Chest pain and shortness of breath. EXAM: CHEST  2 VIEW COMPARISON:  Radiographs and CTA 10/29/2016 FINDINGS: Tip of the right upper extremity central line in the mid SVC. Chronic volume loss in the right hemithorax with right perihilar opacity, stable. Unchanged heart size and mediastinal contours. No focal consolidation, pleural fluid or pneumothorax. IMPRESSION: Unchanged appearance of the chest. No evidence of acute abnormality. Electronically Signed   By: Melanie  Ehinger M.D.   On: 01/02/2017 00:47   Ct Angio Chest Pe W And/or Wo Contrast  Result Date: 01/02/2017 CLINICAL DATA:  Shortness of breath. Active treatment for lung cancer. EXAM: CT ANGIOGRAPHY CHEST WITH CONTRAST TECHNIQUE: Multidetector CT imaging of the chest was performed using the standard protocol during bolus administration of intravenous contrast.  Multiplanar CT image reconstructions and MIPs were obtained to evaluate the vascular anatomy. CONTRAST:  100 cc Isovue 370 IV COMPARISON:  Chest radiograph earlier this day. Multiple prior chest CT, most recently 10/29/2016 FINDINGS: Cardiovascular: There are no filling defects within the pulmonary arteries to suggest pulmonary embolus. Attenuated right pulmonary arteries consistent with treatment change. Aortic atherosclerosis without aneurysm or dissection. Heart is normal in size. Similar pericardial effusion measuring up to 8 mm. Tip of the right central line in the distal SVC. Mediastinum/Nodes: Precarinal soft tissue density measuring 3 mm is unchanged allowing for differences in caliper placement. 10 mm subcarinal node is similar, difficult to differentiate from adjacent opacity. No new mediastinal or hilar adenopathy. The esophagus is decompressed. Visualized thyroid gland is normal. Lungs/Pleura: Stable predominantly paramediastinal postradiation change in the right lung. The left lung is clear. No new pulmonary nodule. Minimal mucous in the right mainstem bronchus no pleural fluid. Upper Abdomen: No acute abnormality. Musculoskeletal: No lytic or blastic lesions. Stable degenerative change in the thoracic spine. Review of the MIP images confirms the above findings. IMPRESSION: 1. No pulmonary embolus. 2. No acute abnormality. Stable posttreatment related changes in the right lung. Stable precarinal mediastinal mass/node. Electronically Signed   By: Melanie  Ehinger M.D.   On: 01/02/2017 02:52     ASSESSMENT & PLAN:   51-year-old African-American female with history of hypertension, diabetes, asthma, dysuria mass and morbid obesity with   #1 h/o Lung Adenocarcinoma Rt sided atleast Stage IIIB (on diagnosis) with large right paratracheal mass with mediastinal adenopathy that appears to have grown significantly over the last 6-7 months and rt supraclavicular LN +.  -Noted to have a small mass in the  left adrenal on CT but PET/CT neg for metastatic disease. Patient has been a lifelong nonsmoker -MRI of the brain was negative for any metastatic disease. At diagnosis. -High PDL1 expression (90%) on [facility] Neg for EGFR, ALK, ROS-1 and BRAF mutations. -Patient completed her planned definitive chemoradiation with carbo Taxol on 02/13/2016. No prohibitive toxicities other than some grade 1 skin desquamation and some grade 1-2 radiation esophagitis. She has subsequently received 2 out of 2 planned dose of carboplatin + Taxol. -Patient has completed   11 doses of maintenance   -CTA chest 10/29/2016  no evidence of lung cancer progression in the chest. No PE -CT abd/pelvis-10/29/2016  Interval 5.0 x 5.0 x 4.7 cm mass in the right lateral subcutaneous fat at the level of the mid abdomen. This has CT features compatible with a metastasis or primary neoplasm.  #2 Now with Metastatic lung adenocarcinoma with isolated metastases in the right lateral subcutaneous fat at the level of the midabdomen. she received maintainence Durvalumab from  05/24/2016 q2weeks, switched to Nivolumab q2weeks on 12/13/16 in the setting of Biopsy proven isolated metastatic disease. Given the tumor is strongly PDL1 positive and patient has only isolated metastatic disease was treated with palliative RT to metastases with significant improvement. Plan -lab stable , FT4 levels improved with improved fatigue. -continue Nivolumab at this time in the absence of more significant evidence of disease progression. - Continue when necessary Percocet for pain management  -She has seen radiation oncology and has recently completed palliative radiation to her metastatic subcutaneous mass and notes decrease in size of this lesion and decreased pain. -will need rpt imaging every 4 months.  #3 h/o radiation pneumonitis - no new symptoms  #4 Improving fatigue (grade 1)  -working with PT once weekly and encourage to walk as much as possible    - optimize DM2 management  - improved fatigue with levothyroxine compliance with near normalization of FT4 levels - Continue Levolthyroxine 125mcg ---will likely need larger dose but in the setting of baseline tachycardia we are gradually increasing dose to avoid ppt cardiac arrhythmias.  #5 normocytic anemia related to chemotherapy -resolving and stable Hg at 9.2  #6 Left chest wall pain due to costochondritis - mx with pain medications. CTA x 2 neg for PE   #7 Sinus tachycardia - on BB per cards , no PE on CTA x 2  -Still having sinus tachycardia . Partly from deconditioning and anemia. Seeing Dr. [name] from cardiology   #8 neoplasm related pain is currently controlled without significant medications. Clinically likely has sleep apnea . Would need to be careful with sedative medications . -recommended sleep study with PCP  #9 hypertension  diabetes -uncontrolled  Dyslipidemia  Asthma - insulin SS for uncontrolled hyperglycemia in the setting of steroid use with chemotherapy. 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 No focal motor deficits. Given concern for metastatic lesion and abdominal wall would also rule out metastatic disease to the brain in the setting of her headaches. -MRI Brain w and wo contrast done - no evidence of metastatic disease -- headache now resolved -- likely tension headache.   -Plan to rescan January 2019 unless pt presents with new symptoms -Continue Nivolumab q2weeks- please schedule next 3 treatments -Prevnar vaccine today  -Pneumococcal conjugate vaccination in 2 month  RTC with Dr Kale in 4wk with lab   All of the patients questions were answered with apparent satisfaction. The patient knows to call the clinic with  any problems, questions or concerns.  I spent 20 minutes counseling the patient face to face. The total time spent in the appointment was 25 minutes and more than 50% was on counseling and direct patient cares.    Gautam Kale MD MS AAHIVMS [facility] Hematology/Oncology Physician [facility]  (Office):         [phone] (Work cell):  [phone] (Fax):           [phone]   This document serves as a record of services personally performed by Sullivan Lone, MD. It was created on her behalf by Alean Rinne, a trained medical scribe. The creation of this record is based on the scribe's personal observations and the provider's statements to them. This document has been checked and approved by the attending provider.

## 2017-01-25 ENCOUNTER — Ambulatory Visit: Payer: Medicaid Other | Admitting: Internal Medicine

## 2017-01-26 ENCOUNTER — Ambulatory Visit (HOSPITAL_BASED_OUTPATIENT_CLINIC_OR_DEPARTMENT_OTHER): Payer: Medicaid Other

## 2017-01-26 ENCOUNTER — Telehealth: Payer: Self-pay | Admitting: Urology

## 2017-01-26 ENCOUNTER — Ambulatory Visit
Admission: RE | Admit: 2017-01-26 | Discharge: 2017-01-26 | Disposition: A | Discharge status: Home / Self Care | Payer: Medicaid Other | Source: Ambulatory Visit | Attending: Urology | Admitting: Urology

## 2017-01-26 DIAGNOSIS — Z7982 Long term (current) use of aspirin: Secondary | ICD-10-CM | POA: Insufficient documentation

## 2017-01-26 DIAGNOSIS — Z794 Long term (current) use of insulin: Secondary | ICD-10-CM | POA: Insufficient documentation

## 2017-01-26 DIAGNOSIS — I110 Hypertensive heart disease with heart failure: Secondary | ICD-10-CM | POA: Diagnosis not present

## 2017-01-26 DIAGNOSIS — Z79899 Other long term (current) drug therapy: Secondary | ICD-10-CM | POA: Diagnosis not present

## 2017-01-26 DIAGNOSIS — I509 Heart failure, unspecified: Secondary | ICD-10-CM | POA: Insufficient documentation

## 2017-01-26 DIAGNOSIS — E119 Type 2 diabetes mellitus without complications: Secondary | ICD-10-CM | POA: Insufficient documentation

## 2017-01-26 DIAGNOSIS — C770 Secondary and unspecified malignant neoplasm of lymph nodes of head, face and neck: Secondary | ICD-10-CM | POA: Diagnosis not present

## 2017-01-26 DIAGNOSIS — Z9851 Tubal ligation status: Secondary | ICD-10-CM | POA: Insufficient documentation

## 2017-01-26 DIAGNOSIS — C792 Secondary malignant neoplasm of skin: Secondary | ICD-10-CM | POA: Diagnosis not present

## 2017-01-26 DIAGNOSIS — C3490 Malignant neoplasm of unspecified part of unspecified bronchus or lung: Secondary | ICD-10-CM | POA: Diagnosis present

## 2017-01-26 DIAGNOSIS — C3491 Malignant neoplasm of unspecified part of right bronchus or lung: Secondary | ICD-10-CM

## 2017-01-26 DIAGNOSIS — Z452 Encounter for adjustment and management of vascular access device: Secondary | ICD-10-CM

## 2017-01-26 DIAGNOSIS — Z79891 Long term (current) use of opiate analgesic: Secondary | ICD-10-CM | POA: Insufficient documentation

## 2017-01-26 DIAGNOSIS — Z9889 Other specified postprocedural states: Secondary | ICD-10-CM | POA: Diagnosis not present

## 2017-01-26 DIAGNOSIS — Z8249 Family history of ischemic heart disease and other diseases of the circulatory system: Secondary | ICD-10-CM | POA: Diagnosis not present

## 2017-01-26 MED ORDER — HEPARIN SOD (PORK) LOCK FLUSH 100 UNIT/ML IV SOLN
250.0000 [IU] | Freq: Once | INTRAVENOUS | Status: AC
  Administered 2017-01-26: 250 [IU]
  Filled 2017-01-26: qty 5

## 2017-01-26 MED ORDER — SODIUM CHLORIDE 0.9% FLUSH
10.0000 mL | Freq: Once | INTRAVENOUS | Status: AC
  Administered 2017-01-26: 10 mL
  Filled 2017-01-26: qty 10

## 2017-01-26 NOTE — Progress Notes (Signed)
Marilyn [name] stated that she forgot that she had an appointment today. Notified Ashlyn that she would not be coming today. Ashlyn stated that she would call and talk to her . Stated that I did not need to reschedule.

## 2017-01-26 NOTE — Telephone Encounter (Signed)
Marilyn [name] forgot that she had an appointment today so she did not show up for her 4pm follow up visit.  I called and spoke with her over the phone to assess her status since completing radiotherapy. She reports that she is doing well and is currently without complaints. She specifically denies flank or abdominal pain, nausea, vomiting, diarrhea or constipation.  She reports significant reduction in size of the treated mass over the right flank and is pain free.  She denies any lower urinary tract symptoms, specifically denies gross hematuria, dysuria, increased frequency, urgency or incomplete emptying. She is pleased with her progress at this point.  She plans to continue maintenance immunotherapy with Nivolumab q2weeks under the care and direction of Dr. Irene Limbo.  She will have repeat imaging with a PET scan in January 2019. I advised that while we are happy to continue to participate in her care if clinically indicated, at this point, we will plan to see her back on an as-needed basis. I encouraged her to call with any questions or concerns related to her previous radiotherapy. She is comfortable with this plan.   Nicholos Johns, PA-C

## 2017-01-28 ENCOUNTER — Ambulatory Visit (HOSPITAL_BASED_OUTPATIENT_CLINIC_OR_DEPARTMENT_OTHER): Payer: Medicaid Other

## 2017-01-28 DIAGNOSIS — C3490 Malignant neoplasm of unspecified part of unspecified bronchus or lung: Secondary | ICD-10-CM | POA: Diagnosis not present

## 2017-01-28 DIAGNOSIS — Z452 Encounter for adjustment and management of vascular access device: Secondary | ICD-10-CM | POA: Diagnosis present

## 2017-01-28 DIAGNOSIS — C770 Secondary and unspecified malignant neoplasm of lymph nodes of head, face and neck: Secondary | ICD-10-CM

## 2017-01-28 DIAGNOSIS — Z95828 Presence of other vascular implants and grafts: Secondary | ICD-10-CM

## 2017-01-28 DIAGNOSIS — C3491 Malignant neoplasm of unspecified part of right bronchus or lung: Secondary | ICD-10-CM

## 2017-01-28 MED ORDER — SODIUM CHLORIDE 0.9 % IJ SOLN
10.0000 mL | INTRAMUSCULAR | Status: DC | PRN
  Administered 2017-01-28: 10 mL via INTRAVENOUS
  Filled 2017-01-28: qty 10

## 2017-01-28 MED ORDER — HEPARIN SOD (PORK) LOCK FLUSH 100 UNIT/ML IV SOLN
500.0000 [IU] | Freq: Once | INTRAVENOUS | Status: AC | PRN
  Administered 2017-01-28: 250 [IU] via INTRAVENOUS
  Filled 2017-01-28: qty 5

## 2017-02-04 ENCOUNTER — Encounter: Payer: Self-pay | Admitting: Internal Medicine

## 2017-02-04 ENCOUNTER — Ambulatory Visit (INDEPENDENT_AMBULATORY_CARE_PROVIDER_SITE_OTHER): Payer: Medicaid Other | Admitting: Internal Medicine

## 2017-02-04 ENCOUNTER — Ambulatory Visit (HOSPITAL_BASED_OUTPATIENT_CLINIC_OR_DEPARTMENT_OTHER): Payer: Medicaid Other

## 2017-02-04 VITALS — BP 122/84 | HR 104 | Ht 69.5 in | Wt 268.0 lb

## 2017-02-04 DIAGNOSIS — C3491 Malignant neoplasm of unspecified part of right bronchus or lung: Secondary | ICD-10-CM

## 2017-02-04 DIAGNOSIS — Z452 Encounter for adjustment and management of vascular access device: Secondary | ICD-10-CM

## 2017-02-04 DIAGNOSIS — I5021 Acute systolic (congestive) heart failure: Secondary | ICD-10-CM

## 2017-02-04 DIAGNOSIS — I428 Other cardiomyopathies: Secondary | ICD-10-CM

## 2017-02-04 DIAGNOSIS — C770 Secondary and unspecified malignant neoplasm of lymph nodes of head, face and neck: Secondary | ICD-10-CM | POA: Diagnosis not present

## 2017-02-04 DIAGNOSIS — C792 Secondary malignant neoplasm of skin: Secondary | ICD-10-CM | POA: Diagnosis not present

## 2017-02-04 DIAGNOSIS — Z95828 Presence of other vascular implants and grafts: Secondary | ICD-10-CM

## 2017-02-04 DIAGNOSIS — C3490 Malignant neoplasm of unspecified part of unspecified bronchus or lung: Secondary | ICD-10-CM | POA: Diagnosis not present

## 2017-02-04 DIAGNOSIS — R000 Tachycardia, unspecified: Secondary | ICD-10-CM | POA: Diagnosis not present

## 2017-02-04 MED ORDER — HEPARIN SOD (PORK) LOCK FLUSH 100 UNIT/ML IV SOLN
500.0000 [IU] | Freq: Once | INTRAVENOUS | Status: AC | PRN
  Administered 2017-02-04: 250 [IU] via INTRAVENOUS
  Filled 2017-02-04: qty 5

## 2017-02-04 MED ORDER — LOSARTAN POTASSIUM 25 MG PO TABS: 25.0000 mg | ORAL_TABLET | Freq: Every day | ORAL | 3 refills | Status: DC

## 2017-02-04 MED ORDER — SODIUM CHLORIDE 0.9 % IJ SOLN
10.0000 mL | INTRAMUSCULAR | Status: DC | PRN
  Administered 2017-02-04: 10 mL via INTRAVENOUS
  Filled 2017-02-04: qty 10

## 2017-02-04 NOTE — Patient Instructions (Signed)
Your physician has recommended you make the following change in your medication:  -- START losartan 25mg  once daily  Your physician has requested that you have an echocardiogram. Echocardiography is a painless test that uses sound waves to create images of your heart. It provides your doctor with information about the size and shape of your heart and how well your heart's chambers and valves are working. This procedure takes approximately one hour. There are no restrictions for this procedure.  Your physician wants you to follow-up in: 6 months with Dr. Debara Pickett after echocardiogram. You will receive a reminder letter in the mail two months in advance. If you don't receive a letter, please call our office to schedule the follow-up appointment.

## 2017-02-04 NOTE — Progress Notes (Signed)
OFFICE CONSULT NOTE  Chief Complaint:  Follow-up CHF  Primary Care Physician: [name], FNP  HPI:  Marilyn [name] is a 51 y.o. female who is being seen today for the evaluation of tachycardia at the request of [name], FNP. Marilyn [name] is a pleasant 51 yo female with a history of diagnosis of lung cancer in September 2017. At that time she underwent chemotherapy and radiation. She has since been deemed cancer free. She was noted to be tachycardic at the time and an echocardiogram was performed. This demonstrated moderate LVH, indeterminate diastolic function EF 35-00%. No significant valvular heart disease and no pericardial effusion was noted. Since then she's been persistently tachycardic with heart rates in the 100-120 range. She denies any worsening shortness of breath or chest pain. She is moderately obese. Blood pressure today is low 96/70. She was previously on metoprolol but that was discontinued as it worsened her asthma. She is now on diltiazem 180 mg for rate control. She has a number of prevascular risk factors including type 2 diabetes, obesity, hypercholesterolemia, hypertension and family history of heart failure in her mother. She is not a smoker. She also has recently diagnosed hypothyroidism with laboratory work that indicated a markedly elevated TSH of 43.4 and a low T4 of 3. Hemoglobin A1c was 9.1.  12/01/2016  Marilyn [name] returns today for follow-up. She underwent an echocardiogram and nuclear stress test. Her echo shows a reduction in LV systolic function to 93-81%, from her previous echo in September. A nuclear stress test performed which shows no ischemia. I suspect that the degree of cardiomyopathy is responsible for her tachycardia. She is being treated for hypothyroidism and has had an elevated TSH and low T4. Her oncologist is been increasing her levothyroxine which is now 125 g daily. Free T4 remains suppressed at 0.19. As well as T3 uptake and TSH  is still elevated at 42. Recently she been on metoprolol, but says that she thinks it worsened her asthma. Interestingly she's not on any asthma medications and had a childhood asthma. She reported the worsening of her "asthma" was somewhat of a nonproductive cough and shortness of breath when walking up stairs. Not certain that this is the medicine. We discussed treatment of cardiomyopathy today and the fact that she is also diabetic and would benefit from both a beta blocker and ACE inhibitor or ARB.  10/04/2016  Marilyn [name] returns today for follow-up.  Overall she seems to feel well.  Thyroid function is not completely optimized yet and is managed by the oncologist.  She denies any chest pain or worsening shortness of breath.  Heart rate is elevated today however she did not take her carvedilol.  Blood pressure is normal.  Her last LVEF was 45-50% earlier this year.  She reports her diabetes is up and down.  PMHx:  Past Medical History:  Diagnosis Date  . Asthma   . Diabetes mellitus   . Hypercholesteremia   . Hypertension   . Lung cancer ([place])   . Neuropathy   . Obese     Past Surgical History:  Procedure Laterality Date  . CESAREAN SECTION    . IR GENERIC HISTORICAL  01/08/2016   IR US GUIDE VASC ACCESS RIGHT 01/08/2016 [name], DO [facility]  . IR GENERIC HISTORICAL  01/08/2016   IR FLUORO GUIDE CV LINE RIGHT 01/08/2016 [name], DO [facility]  . TUBAL LIGATION    . US guided core needle biopsy  of right lower neck/supraclavicular lymph nodes    FAMHx:  Family History  Problem Relation Age of Onset  . Heart failure Mother   . Hypertension Mother   . Cancer Neg Hx   . Rheumatologic disease Neg Hx     SOCHx:   reports that she has never smoked. She has never used smokeless tobacco. She reports that she does not drink alcohol or use drugs.  ALLERGIES:  No Known Allergies  ROS: Pertinent items noted in HPI and remainder of comprehensive ROS otherwise  negative.  HOME MEDS: Current Outpatient Prescriptions on File Prior to Visit  Medication Sig Dispense Refill  . aspirin EC 81 MG tablet Take 1 tablet (81 mg total) by mouth daily. 60 tablet 2  . benzonatate (TESSALON) 100 MG capsule Take 1 capsule (100 mg total) by mouth 3 (three) times daily as needed for cough. 60 capsule 0  . carvedilol (COREG) 6.25 MG tablet Take 1 tablet (6.25 mg total) by mouth 2 (two) times daily. 180 tablet 3  . DULoxetine (CYMBALTA) 60 MG capsule Take 1 capsule (60 mg total) by mouth daily. 30 capsule 2  . insulin aspart (NOVOLOG) 100 UNIT/ML FlexPen As per sliding scale instructions (Patient taking differently: Inject 0-20 Units into the skin 3 (three) times daily as needed for high blood sugar. As per sliding scale instructions) 15 mL 2  . Insulin Degludec (TRESIBA FLEXTOUCH) 200 UNIT/ML SOPN Inject 100 Units into the skin daily before breakfast.     . levothyroxine (SYNTHROID, LEVOTHROID) 125 MCG tablet Take 1 tablet (125 mcg total) by mouth daily before breakfast. 30 tablet 2  . loratadine (CLARITIN) 10 MG tablet Take 10 mg by mouth daily.    [name] omeprazole (PRILOSEC) 20 MG capsule Take 1 capsule (20 mg total) by mouth daily. 30 capsule 0  . oxyCODONE-acetaminophen (PERCOCET/ROXICET) 5-325 MG tablet Take 1-2 tablets by mouth every 6 (six) hours as needed for moderate pain or severe pain. 40 tablet 0  . polyethylene glycol (MIRALAX) packet Take 17 g by mouth daily. 30 each 1  . senna-docusate (SENNA S) 8.6-50 MG tablet Take 2 tablets by mouth 2 (two) times daily. May reduce to 2 tab po HS as needed once regular BM estabished 60 tablet 1  . [DISCONTINUED] insulin aspart protamine- aspart (NOVOLOG MIX 70/30) (70-30) 100 UNIT/ML injection Inject 0.6 mLs (60 Units total) into the skin 2 (two) times daily with a meal. (Patient not taking: Reported on 10/07/2014) 10 mL 12  . [DISCONTINUED] lisinopril (PRINIVIL,ZESTRIL) 20 MG tablet Take 0.5 tablets (10 mg total) by mouth daily.  (Patient not taking: Reported on 10/07/2014) 30 tablet 0  . [DISCONTINUED] prochlorperazine (COMPAZINE) 10 MG tablet Take 1 tablet (10 mg total) by mouth every 6 (six) hours as needed (Nausea or vomiting). 30 tablet 1   Current Facility-Administered Medications on File Prior to Visit  Medication Dose Route Frequency Provider Last Rate Last Dose  . sodium chloride 0.9 % injection 10 mL  10 mL Intravenous PRN [name], MD   10 mL at 05/24/16 1252    LABS/IMAGING: No results found for this or any previous visit (from the past 48 hour(s)). No results found.  LIPID PANEL: No results found for: CHOL, TRIG, HDL, CHOLHDL, VLDL, LDLCALC, LDLDIRECT  WEIGHTS: Wt Readings from Last 3 Encounters:  02/04/17 268 lb (121.6 kg)  01/24/17 278 lb 12.8 oz (126.5 kg)  01/10/17 278 lb 9.6 oz (126.4 kg)    VITALS: BP 122/84   Pulse [name])  104   Ht 5' 9.5" (1.765 m)   Wt 268 lb (121.6 kg)   BMI 39.01 kg/m   EXAM: General appearance: alert, no distress and moderately obese Neck: no carotid bruit, no JVD and thyroid not enlarged, symmetric, no tenderness/mass/nodules Lungs: clear to auscultation bilaterally Heart: regular rate and rhythm, S1, S2 normal, no murmur, click, rub or gallop Abdomen: soft, non-tender; bowel sounds normal; no masses,  no organomegaly Extremities: extremities normal, atraumatic, no cyanosis or edema Pulses: 2+ and symmetric Skin: Skin color, texture, turgor normal. No rashes or lesions Neurologic: Grossly normal Psych: Pleasant  EKG: Deferred  ASSESSMENT: 1. Sinus tachycardia - possibly related to CHF vs. Iatrogenic thyroid replacement 2. Recent lung cancer status post radiation chemotherapy 3. Morbid obesity 4. Type 2 diabetes 5. Hypertension 6. Dyslipidemia  PLAN: 1.   Marilyn [name] has had some improvement in heart rate although it is elevated on carvedilol.  Blood pressure would tolerate addition of ACE or ARB.  Will start losartan 25 mg daily today.   Her creatinine was normal recently.  Plan to recheck an echocardiogram in 6 months and will follow up with her then.  Pixie Casino, MD, [place]  Attending Cardiologist  Direct Dial: [phone]  Fax: [phone]  Website:  [email or web address] 02/04/2017, 9:29 AM

## 2017-02-07 ENCOUNTER — Other Ambulatory Visit: Payer: Self-pay | Admitting: Hematology

## 2017-02-07 ENCOUNTER — Ambulatory Visit: Payer: Medicaid Other

## 2017-02-07 ENCOUNTER — Encounter: Payer: Self-pay | Admitting: Hematology

## 2017-02-07 ENCOUNTER — Other Ambulatory Visit (HOSPITAL_BASED_OUTPATIENT_CLINIC_OR_DEPARTMENT_OTHER): Payer: Medicaid Other

## 2017-02-07 ENCOUNTER — Ambulatory Visit (HOSPITAL_BASED_OUTPATIENT_CLINIC_OR_DEPARTMENT_OTHER): Payer: Medicaid Other | Admitting: Hematology

## 2017-02-07 ENCOUNTER — Ambulatory Visit (HOSPITAL_BASED_OUTPATIENT_CLINIC_OR_DEPARTMENT_OTHER): Payer: Medicaid Other

## 2017-02-07 VITALS — BP 124/91 | HR 107 | Temp 98.1°F | Resp 20 | Ht 69.5 in | Wt 272.4 lb

## 2017-02-07 DIAGNOSIS — E1165 Type 2 diabetes mellitus with hyperglycemia: Secondary | ICD-10-CM | POA: Diagnosis not present

## 2017-02-07 DIAGNOSIS — I10 Essential (primary) hypertension: Secondary | ICD-10-CM

## 2017-02-07 DIAGNOSIS — Z5112 Encounter for antineoplastic immunotherapy: Secondary | ICD-10-CM | POA: Diagnosis not present

## 2017-02-07 DIAGNOSIS — C3491 Malignant neoplasm of unspecified part of right bronchus or lung: Secondary | ICD-10-CM

## 2017-02-07 DIAGNOSIS — G893 Neoplasm related pain (acute) (chronic): Secondary | ICD-10-CM

## 2017-02-07 DIAGNOSIS — C792 Secondary malignant neoplasm of skin: Secondary | ICD-10-CM | POA: Diagnosis not present

## 2017-02-07 DIAGNOSIS — C3490 Malignant neoplasm of unspecified part of unspecified bronchus or lung: Secondary | ICD-10-CM

## 2017-02-07 DIAGNOSIS — Z794 Long term (current) use of insulin: Secondary | ICD-10-CM

## 2017-02-07 DIAGNOSIS — C770 Secondary and unspecified malignant neoplasm of lymph nodes of head, face and neck: Secondary | ICD-10-CM | POA: Diagnosis not present

## 2017-02-07 DIAGNOSIS — Z23 Encounter for immunization: Secondary | ICD-10-CM

## 2017-02-07 DIAGNOSIS — Z79899 Other long term (current) drug therapy: Secondary | ICD-10-CM | POA: Diagnosis not present

## 2017-02-07 DIAGNOSIS — E032 Hypothyroidism due to medicaments and other exogenous substances: Secondary | ICD-10-CM

## 2017-02-07 DIAGNOSIS — R05 Cough: Secondary | ICD-10-CM | POA: Diagnosis not present

## 2017-02-07 DIAGNOSIS — Z95828 Presence of other vascular implants and grafts: Secondary | ICD-10-CM

## 2017-02-07 DIAGNOSIS — E279 Disorder of adrenal gland, unspecified: Secondary | ICD-10-CM

## 2017-02-07 DIAGNOSIS — Z452 Encounter for adjustment and management of vascular access device: Secondary | ICD-10-CM

## 2017-02-07 LAB — CBC & DIFF AND RETIC
BASO%: 0.4 % (ref 0.0–2.0)
Basophils Absolute: 0 10*3/uL (ref 0.0–0.1)
EOS ABS: 0.4 10*3/uL (ref 0.0–0.5)
EOS%: 8.5 % — ABNORMAL HIGH (ref 0.0–7.0)
HCT: 28.8 % — ABNORMAL LOW (ref 34.8–46.6)
HEMOGLOBIN: 9.3 g/dL — AB (ref 11.6–15.9)
IMMATURE RETIC FRACT: 7 % (ref 1.60–10.00)
LYMPH#: 1 10*3/uL (ref 0.9–3.3)
LYMPH%: 23.2 % (ref 14.0–49.7)
MCH: 29 pg (ref 25.1–34.0)
MCHC: 32.3 g/dL (ref 31.5–36.0)
MCV: 89.7 fL (ref 79.5–101.0)
MONO#: 0.3 10*3/uL (ref 0.1–0.9)
MONO%: 6 % (ref 0.0–14.0)
NEUT%: 61.9 % (ref 38.4–76.8)
NEUTROS ABS: 2.8 10*3/uL (ref 1.5–6.5)
Platelets: 257 10*3/uL (ref 145–400)
RBC: 3.21 10*6/uL — AB (ref 3.70–5.45)
RDW: 12.8 % (ref 11.2–14.5)
RETIC %: 1.96 % (ref 0.70–2.10)
RETIC CT ABS: 62.92 10*3/uL (ref 33.70–90.70)
WBC: 4.5 10*3/uL (ref 3.9–10.3)

## 2017-02-07 LAB — COMPREHENSIVE METABOLIC PANEL
ALBUMIN: 3.1 g/dL — AB (ref 3.5–5.0)
ALT: 9 U/L (ref 0–55)
AST: 13 U/L (ref 5–34)
Alkaline Phosphatase: 80 U/L (ref 40–150)
Anion Gap: 7 mEq/L (ref 3–11)
BILIRUBIN TOTAL: 0.32 mg/dL (ref 0.20–1.20)
BUN: 6.8 mg/dL — AB (ref 7.0–26.0)
CALCIUM: 8.9 mg/dL (ref 8.4–10.4)
CHLORIDE: 102 meq/L (ref 98–109)
CO2: 28 mEq/L (ref 22–29)
CREATININE: 0.8 mg/dL (ref 0.6–1.1)
EGFR: >60 mL/min/{1.73_m2} (ref >60)
Glucose: 233 mg/dl — ABNORMAL HIGH (ref 70–140)
Potassium: 3.9 mEq/L (ref 3.5–5.1)
Sodium: 138 mEq/L (ref 136–145)
TOTAL PROTEIN: 7.7 g/dL (ref 6.4–8.3)

## 2017-02-07 LAB — TSH: TSH: 24.378 m[IU]/L — AB (ref 0.308–3.960)

## 2017-02-07 MED ORDER — SODIUM CHLORIDE 0.9 % IJ SOLN
10.0000 mL | INTRAMUSCULAR | Status: DC | PRN
  Administered 2017-02-07: 10 mL via INTRAVENOUS
  Filled 2017-02-07: qty 10

## 2017-02-07 MED ORDER — HEPARIN SOD (PORK) LOCK FLUSH 100 UNIT/ML IV SOLN
250.0000 [IU] | Freq: Once | INTRAVENOUS | Status: AC | PRN
  Administered 2017-02-07: 250 [IU]
  Filled 2017-02-07: qty 5

## 2017-02-07 MED ORDER — SODIUM CHLORIDE 0.9 % IV SOLN
240.0000 mg | Freq: Once | INTRAVENOUS | Status: AC
  Administered 2017-02-07: 240 mg via INTRAVENOUS
  Filled 2017-02-07: qty 24

## 2017-02-07 MED ORDER — SODIUM CHLORIDE 0.9 % IV SOLN
Freq: Once | INTRAVENOUS | Status: AC
  Administered 2017-02-07: 10:00:00 via INTRAVENOUS

## 2017-02-07 MED ORDER — SODIUM CHLORIDE 0.9% FLUSH
10.0000 mL | INTRAVENOUS | Status: DC | PRN
  Administered 2017-02-07: 10 mL
  Filled 2017-02-07: qty 10

## 2017-02-07 NOTE — Patient Instructions (Signed)
[facility] Discharge Instructions for Patients Receiving Chemotherapy  Today you received the following chemotherapy agents nivolumab   To help prevent nausea and vomiting after your treatment, we encourage you to take your nausea medication as directed   If you develop nausea and vomiting that is not controlled by your nausea medication, call the clinic.   BELOW ARE SYMPTOMS THAT SHOULD BE REPORTED IMMEDIATELY:  *FEVER GREATER THAN 100.5 F  *CHILLS WITH OR WITHOUT FEVER  NAUSEA AND VOMITING THAT IS NOT CONTROLLED WITH YOUR NAUSEA MEDICATION  *UNUSUAL SHORTNESS OF BREATH  *UNUSUAL BRUISING OR BLEEDING  TENDERNESS IN MOUTH AND THROAT WITH OR WITHOUT PRESENCE OF ULCERS  *URINARY PROBLEMS  *BOWEL PROBLEMS  UNUSUAL RASH Items with * indicate a potential emergency and should be followed up as soon as possible.  Feel free to call the clinic you have any questions or concerns. The clinic phone number is [phone].  

## 2017-02-07 NOTE — Progress Notes (Signed)
Marilyn [name] Kitchen    HEMATOLOGY/ONCOLOGY CLINIC NOTE  Date of Service: 02/07/2017   Patient Care Team: [name], FNP as PCP - General (Nurse Practitioner)  CHIEF COMPLAINTS/PURPOSE OF CONSULTATION:  f/u Lung Adenocarcinoma.  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  Current treatment:  Nivolumab q2 weeks  PreviousTreatment -Concurrent Chemo-radiation with Carboplatin/Taxol. Completed RT on 02/13/2016 and has then completed 2 cycles of carboplatin + taxol. -Started Maintainence Durvalumab from  05/24/2016 q2weeks, switched to Nivolumab q2weeks on 12/13/16 once metastatic disease noted -s/p palliative RT to rt sided Subcutaneous metastatic mass.    HISTORY OF PRESENTING ILLNESS: Plz see my previous note for details on initial presentation.  INTERVAL HISTORY   Pt presents to the office today for followup of her metastatic lung cancer. She is receiving her 5th cycle of Nivolumab today. She states that she is doing well overall. Her energy levels have improved and has not been able to take her levothyroxine for a week due to cost but has been able to fill her prescription since then. She report a chronic productive cough after physical activities such as walking.  She reports that she was started on losartan by her cardiologist. She states she needs several teeth extracted and will go to oral surgeon -Dr. Arnoldo Morale for this.  Notes that she has now has adequate help with her son who was paralyzed after a car accident. No new rashes.  No diarrhea. Her right flank soft tissue mass continues to regress significantly. On review of systems, pt reports mild SOB, productive cough and denies fever, chills, abdominal pain, and any other accompanying symptoms.     MEDICAL HISTORY:  Past Medical History:  Diagnosis Date  . Asthma   . Diabetes mellitus   . Hypercholesteremia   . Hypertension   . Lung cancer ([place])   . Neuropathy   . Obese     SURGICAL  HISTORY: Past Surgical History:  Procedure Laterality Date  . CESAREAN SECTION    . IR GENERIC HISTORICAL  01/08/2016   IR US GUIDE VASC ACCESS RIGHT 01/08/2016 [name], DO [facility]  . IR GENERIC HISTORICAL  01/08/2016   IR FLUORO GUIDE CV LINE RIGHT 01/08/2016 [name], DO [facility]  . TUBAL LIGATION    . US guided core needle biopsy     of right lower neck/supraclavicular lymph nodes    SOCIAL HISTORY: Social History   Socioeconomic History  . Marital status: Divorced    Spouse name: Not on file  . Number of children: Not on file  . Years of education: Not on file  . Highest education level: Not on file  Social Needs  . Financial resource strain: Not on file  . Food insecurity - worry: Not on file  . Food insecurity - inability: Not on file  . Transportation needs - medical: Not on file  . Transportation needs - non-medical: Not on file  Occupational History  . Not on file  Tobacco Use  . Smoking status: Never Smoker  . Smokeless tobacco: Never Used  Substance and Sexual Activity  . Alcohol use: No  . Drug use: No  . Sexual activity: Not Currently  Other Topics Concern  . Not on file  Social History Narrative   No bird or mold exposure. No recent travel.    FAMILY HISTORY: Family History  Problem Relation Age of Onset  . Heart failure Mother   . Hypertension Mother   . Cancer Neg Hx   .  Rheumatologic disease Neg Hx     ALLERGIES:  has No Known Allergies.  MEDICATIONS:  Current Outpatient Medications  Medication Sig Dispense Refill  . aspirin EC 81 MG tablet Take 1 tablet (81 mg total) by mouth daily. 60 tablet 2  . benzonatate (TESSALON) 100 MG capsule Take 1 capsule (100 mg total) by mouth 3 (three) times daily as needed for cough. 60 capsule 0  . carvedilol (COREG) 6.25 MG tablet Take 1 tablet (6.25 mg total) by mouth 2 (two) times daily. 180 tablet 3  . DULoxetine (CYMBALTA) 60 MG capsule Take 1 capsule (60 mg total) by mouth daily. 30  capsule 2  . insulin aspart (NOVOLOG) 100 UNIT/ML FlexPen As per sliding scale instructions (Patient taking differently: Inject 0-20 Units into the skin 3 (three) times daily as needed for high blood sugar. As per sliding scale instructions) 15 mL 2  . Insulin Degludec (TRESIBA FLEXTOUCH) 200 UNIT/ML SOPN Inject 100 Units into the skin daily before breakfast.     . levothyroxine (SYNTHROID, LEVOTHROID) 125 MCG tablet Take 1 tablet (125 mcg total) by mouth daily before breakfast. 30 tablet 2  . loratadine (CLARITIN) 10 MG tablet Take 10 mg by mouth daily.    Marilyn [name] Kitchen losartan (COZAAR) 25 MG tablet Take 1 tablet (25 mg total) by mouth daily. 90 tablet 3  . omeprazole (PRILOSEC) 20 MG capsule Take 1 capsule (20 mg total) by mouth daily. 30 capsule 0  . oxyCODONE-acetaminophen (PERCOCET/ROXICET) 5-325 MG tablet Take 1-2 tablets by mouth every 6 (six) hours as needed for moderate pain or severe pain. 40 tablet 0  . polyethylene glycol (MIRALAX) packet Take 17 g by mouth daily. 30 each 1  . senna-docusate (SENNA S) 8.6-50 MG tablet Take 2 tablets by mouth 2 (two) times daily. May reduce to 2 tab po HS as needed once regular BM estabished 60 tablet 1   No current facility-administered medications for this visit.    Facility-Administered Medications Ordered in Other Visits  Medication Dose Route Frequency Provider Last Rate Last Dose  . 0.9 %  sodium chloride infusion   Intravenous Once [name], MD      . heparin lock flush 100 unit/mL  250 Units Intracatheter Once PRN [name], MD      . nivolumab (OPDIVO) 240 mg in sodium chloride 0.9 % 100 mL chemo infusion  240 mg Intravenous Once [name], MD      . sodium chloride 0.9 % injection 10 mL  10 mL Intravenous PRN [name], MD   10 mL at 05/24/16 1252  . sodium chloride flush (NS) 0.9 % injection 10 mL  10 mL Intracatheter PRN [name], MD        REVIEW OF SYSTEMS:    10 Point review of Systems  was done is negative except as noted above.  PHYSICAL EXAMINATION: ECOG PERFORMANCE STATUS: 1 - Symptomatic but completely ambulatory  . Vitals:   02/07/17 0901  BP: (!) 124/91  Pulse: (!) 107  Resp: 20  Temp: 98.1 F (36.7 C)  SpO2: 100%   Filed Weights   02/07/17 0901  Weight: 272 lb 6.4 oz (123.6 kg)   .Body mass index is 39.65 kg/m.  GENERAL:alert, in no acute distress and comfortable SKIN: radiation related skin injury over neck - healing EYES: normal, conjunctiva are pink and non-injected, sclera clear OROPHARYNX:oral thrush noted NECK: supple, no JVD, thyroid normal size, non-tender, without nodularity LYMPH:  no palpable lymphadenopathy in  the cervical, axillary or inguinal.             LUNGS: clear to auscultation with normal respiratory effort. Distant breath sounds  HEART: regular rate & rhythm,  no murmurs and no lower extremity edema ABDOMEN: abdomen obese, soft, non-tender, normoactive bowel sounds , rt flank subcutaneous mass palpable. Musculoskeletal: no cyanosis of digits and no clubbing  PSYCH: alert & oriented x 3 with fluent speech NEURO: no focal motor/sensory deficits  LABORATORY DATA: .  Marilyn [name] Kitchen CBC Latest Ref Rng & Units 02/07/2017 01/24/2017 01/10/2017  WBC 3.9 - 10.3 10e3/uL 4.5 4.1 4.6  Hemoglobin 11.6 - 15.9 g/dL 9.3(L) 9.2(L) 9.2(L)  Hematocrit 34.8 - 46.6 % 28.8(L) 29.0(L) 28.6(L)  Platelets 145 - 400 10e3/uL 257 299 238   . CMP Latest Ref Rng & Units 02/07/2017 01/24/2017 01/10/2017  Glucose 70 - 140 mg/dl 233(H) 80 227(H)  BUN 7.0 - 26.0 mg/dL 6.8(L) 8.2 7.4  Creatinine 0.6 - 1.1 mg/dL 0.8 0.8 0.8  Sodium 136 - 145 mEq/L 138 141 139  Potassium 3.5 - 5.1 mEq/L 3.9 4.0 4.1  Chloride 101 - 111 mmol/L - - -  CO2 22 - 29 mEq/L '28 29 28  ' Calcium 8.4 - 10.4 mg/dL 8.9 9.0 9.5  Total Protein 6.4 - 8.3 g/dL 7.7 7.7 7.7  Total Bilirubin 0.20 - 1.20 mg/dL 0.32 0.29 0.24  Alkaline Phos 40 - 150 U/L 80 88 92  AST 5 - 34 U/L '13 14 14  ' ALT 0 - 55 U/L '9  8 9                ' RADIOGRAPHIC STUDIES:  .No results found.   ASSESSMENT & PLAN:   51 year old African-American female with history of hypertension, diabetes, asthma, dysuria mass and morbid obesity with   #1 h/o Lung Adenocarcinoma Rt sided atleast Stage IIIB (on diagnosis) with large right paratracheal mass with mediastinal adenopathy that appears to have grown significantly over the last 6-7 months and rt supraclavicular LN +.  -Noted to have a small mass in the left adrenal on CT but PET/CT neg for metastatic disease. Patient has been a lifelong nonsmoker -MRI of the brain was negative for any metastatic disease. At diagnosis. -High PDL1 expression (90%) on [facility] One Neg for EGFR, ALK, ROS-1 and BRAF mutations. -Patient completed her planned definitive chemoradiation with carbo Taxol on 02/13/2016. No prohibitive toxicities other than some grade 1 skin desquamation and some grade 1-2 radiation esophagitis. She has subsequently received 2 out of 2 planned dose of carboplatin + Taxol. -Patient has completed 11 doses of maintenance   -CTA chest 10/29/2016  no evidence of lung cancer progression in the chest. No PE -CT abd/pelvis-10/29/2016  Interval 5.0 x 5.0 x 4.7 cm mass in the right lateral subcutaneous fat at the level of the mid abdomen. This has CT features compatible with a metastasis or primary neoplasm.  #2 Now with Metastatic lung adenocarcinoma with isolated metastases in the right lateral subcutaneous fat at the level of the midabdomen. she received maintainence Durvalumab from  05/24/2016 q2weeks, switched to Nivolumab q2weeks on 12/13/16 in the setting of Biopsy proven isolated metastatic disease. Given the tumor is strongly PDL1 positive and patient has only isolated metastatic disease was treated with palliative RT to metastases with significant improvement. Plan -Patient reports no symptoms suggestive of disease progression at this time. -No preoperative  toxicities from Nivolumab. -lab stable -continue Nivolumab at this time in the absence of overt disease progression or prohibitive toxicities. -  Continue when necessary Percocet for pain management  -She has seen radiation oncology and has recently completed palliative radiation to her metastatic subcutaneous mass and notes continued decrease in size of this lesion and decreased pain. -will need rpt imaging every 4 months. Would plan in Jan 2019  #3 h/o radiation pneumonitis - no new symptoms. Does has some cough with exertion.  #4 Improving fatigue (grade 1)  -working with outpatient PT program - optimize DM2 management with PCP - improved fatigue with levothyroxine compliance with near normalization of FT4 levels.  Has been off her thyroid replacement for 1 week and has now restarted. - Continue Levolthyroxine 134mg ---will likely need larger dose but in the setting of baseline tachycardia we are gradually increasing dose to avoid ppt cardiac arrhythmias.  Also levels pending from today are likely not reflective since the patient has been off her thyroid replacement for about a week.  We will recheck her thyroid function tests in about 4 weeks if she remains compliant with the medications.  #5 normocytic anemia related to chemotherapy -resolving and stable Hg at 9.3  #6 Left chest wall pain due to costochondritis - mx with pain medications. CTA x 2 neg for PE   #7 Sinus tachycardia - on BB per cards , no PE on CTA x 2  -Still having sinus tachycardia . Partly from deconditioning and anemia. Seeing Dr. HDebara Pickettfrom cardiology  -Continues to be on Coreg  #8 neoplasm related pain is currently controlled without significant medications. Clinically likely has sleep apnea . Would need to be careful with sedative medications . 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   -Continue labs q2weeks with treatment -Plan to rescan January 2019 unless pt presents with new symptoms -Pneumococcal conjugate vaccination in 1 month -continue Nivolumab Q2weeks as per orders. Please schedule next 4 doses. -RTC with Dr KaIrene Limbon 4 weeks -continue Labs q2weeks with treatment   All of the patients questions were answered with apparent satisfaction. The patient knows to call the clinic with any problems, questions or concerns.  I spent 20 minutes counseling the patient face to face. The total time spent in the appointment was 25 minutes and more than 50% was on counseling and direct patient cares.    GaSullivan LoneD MSAndoverAHIVMS SCMarin Ophthalmic Surgery CenterTAlexian Brothers Medical Centerematology/Oncology Physician CoChidester(Office):       [phone] cell):  [phone]):           [phone] This document serves as a record of services personally performed by GaSullivan LoneMD. It was created on her behalf by LaAlean Rinnea trained medical scribe. The creation of this record is based on the scribe's personal observations and the provider's statements to them.    .I have reviewed the above documentation for accuracy and completeness, and I agree with the above. .GBrunetta GeneraD MS

## 2017-02-08 LAB — T4, FREE: FREE T4: 0.64 ng/dL — AB (ref 0.82–1.77)

## 2017-02-09 ENCOUNTER — Ambulatory Visit (HOSPITAL_BASED_OUTPATIENT_CLINIC_OR_DEPARTMENT_OTHER): Payer: Medicaid Other

## 2017-02-09 DIAGNOSIS — Z452 Encounter for adjustment and management of vascular access device: Secondary | ICD-10-CM

## 2017-02-09 DIAGNOSIS — C770 Secondary and unspecified malignant neoplasm of lymph nodes of head, face and neck: Secondary | ICD-10-CM

## 2017-02-09 DIAGNOSIS — C3490 Malignant neoplasm of unspecified part of unspecified bronchus or lung: Secondary | ICD-10-CM | POA: Diagnosis not present

## 2017-02-09 DIAGNOSIS — C3491 Malignant neoplasm of unspecified part of right bronchus or lung: Secondary | ICD-10-CM

## 2017-02-09 DIAGNOSIS — Z95828 Presence of other vascular implants and grafts: Secondary | ICD-10-CM

## 2017-02-09 MED ORDER — HEPARIN SOD (PORK) LOCK FLUSH 100 UNIT/ML IV SOLN
500.0000 [IU] | Freq: Once | INTRAVENOUS | Status: AC | PRN
  Administered 2017-02-09: 250 [IU] via INTRAVENOUS
  Filled 2017-02-09: qty 5

## 2017-02-09 MED ORDER — SODIUM CHLORIDE 0.9 % IJ SOLN
10.0000 mL | INTRAMUSCULAR | Status: DC | PRN
  Administered 2017-02-09: 10 mL via INTRAVENOUS
  Filled 2017-02-09: qty 10

## 2017-02-11 ENCOUNTER — Ambulatory Visit (HOSPITAL_BASED_OUTPATIENT_CLINIC_OR_DEPARTMENT_OTHER): Payer: Medicaid Other

## 2017-02-11 ENCOUNTER — Telehealth: Payer: Self-pay | Admitting: Hematology

## 2017-02-11 DIAGNOSIS — C770 Secondary and unspecified malignant neoplasm of lymph nodes of head, face and neck: Secondary | ICD-10-CM | POA: Diagnosis not present

## 2017-02-11 DIAGNOSIS — C3491 Malignant neoplasm of unspecified part of right bronchus or lung: Secondary | ICD-10-CM

## 2017-02-11 DIAGNOSIS — C3490 Malignant neoplasm of unspecified part of unspecified bronchus or lung: Secondary | ICD-10-CM | POA: Diagnosis not present

## 2017-02-11 DIAGNOSIS — Z452 Encounter for adjustment and management of vascular access device: Secondary | ICD-10-CM

## 2017-02-11 DIAGNOSIS — Z95828 Presence of other vascular implants and grafts: Secondary | ICD-10-CM

## 2017-02-11 MED ORDER — SODIUM CHLORIDE 0.9% FLUSH
10.0000 mL | Freq: Once | INTRAVENOUS | Status: AC
  Administered 2017-02-11: 10 mL
  Filled 2017-02-11: qty 10

## 2017-02-11 MED ORDER — HEPARIN SOD (PORK) LOCK FLUSH 100 UNIT/ML IV SOLN
250.0000 [IU] | Freq: Once | INTRAVENOUS | Status: AC
  Administered 2017-02-11: 250 [IU]
  Filled 2017-02-11: qty 5

## 2017-02-11 MED ORDER — HEPARIN SOD (PORK) LOCK FLUSH 100 UNIT/ML IV SOLN
500.0000 [IU] | Freq: Once | INTRAVENOUS | Status: DC
  Filled 2017-02-11: qty 5

## 2017-02-11 NOTE — Telephone Encounter (Signed)
Left message for patient regarding upcoming November and December appointment updates per 11/5 los.

## 2017-02-14 ENCOUNTER — Ambulatory Visit (HOSPITAL_BASED_OUTPATIENT_CLINIC_OR_DEPARTMENT_OTHER): Payer: Medicaid Other

## 2017-02-14 DIAGNOSIS — C770 Secondary and unspecified malignant neoplasm of lymph nodes of head, face and neck: Secondary | ICD-10-CM | POA: Diagnosis not present

## 2017-02-14 DIAGNOSIS — Z452 Encounter for adjustment and management of vascular access device: Secondary | ICD-10-CM

## 2017-02-14 DIAGNOSIS — C3491 Malignant neoplasm of unspecified part of right bronchus or lung: Secondary | ICD-10-CM | POA: Diagnosis not present

## 2017-02-14 DIAGNOSIS — C3490 Malignant neoplasm of unspecified part of unspecified bronchus or lung: Secondary | ICD-10-CM | POA: Diagnosis not present

## 2017-02-14 MED ORDER — SODIUM CHLORIDE 0.9% FLUSH
10.0000 mL | Freq: Once | INTRAVENOUS | Status: AC
  Administered 2017-02-14: 10 mL
  Filled 2017-02-14: qty 10

## 2017-02-14 MED ORDER — HEPARIN SOD (PORK) LOCK FLUSH 100 UNIT/ML IV SOLN
250.0000 [IU] | Freq: Once | INTRAVENOUS | Status: AC
  Administered 2017-02-14: 250 [IU]
  Filled 2017-02-14: qty 5

## 2017-02-15 ENCOUNTER — Other Ambulatory Visit: Payer: Self-pay | Admitting: Radiology

## 2017-02-16 ENCOUNTER — Other Ambulatory Visit: Payer: Self-pay | Admitting: Radiology

## 2017-02-17 ENCOUNTER — Other Ambulatory Visit: Payer: Self-pay | Admitting: Radiology

## 2017-02-18 ENCOUNTER — Other Ambulatory Visit: Payer: Self-pay | Admitting: Hematology

## 2017-02-18 ENCOUNTER — Ambulatory Visit (HOSPITAL_COMMUNITY)
Admission: RE | Admit: 2017-02-18 | Discharge: 2017-02-18 | Disposition: A | Discharge status: Home / Self Care | Payer: Medicaid Other | Source: Ambulatory Visit | Attending: Hematology | Admitting: Hematology

## 2017-02-18 ENCOUNTER — Encounter (HOSPITAL_COMMUNITY): Payer: Self-pay

## 2017-02-18 DIAGNOSIS — J45909 Unspecified asthma, uncomplicated: Secondary | ICD-10-CM | POA: Diagnosis not present

## 2017-02-18 DIAGNOSIS — E1140 Type 2 diabetes mellitus with diabetic neuropathy, unspecified: Secondary | ICD-10-CM | POA: Diagnosis not present

## 2017-02-18 DIAGNOSIS — E7800 Pure hypercholesterolemia, unspecified: Secondary | ICD-10-CM | POA: Insufficient documentation

## 2017-02-18 DIAGNOSIS — I10 Essential (primary) hypertension: Secondary | ICD-10-CM | POA: Diagnosis not present

## 2017-02-18 DIAGNOSIS — C3490 Malignant neoplasm of unspecified part of unspecified bronchus or lung: Secondary | ICD-10-CM

## 2017-02-18 DIAGNOSIS — E669 Obesity, unspecified: Secondary | ICD-10-CM | POA: Diagnosis not present

## 2017-02-18 HISTORY — PX: IR FLUORO GUIDE PORT INSERTION RIGHT: IMG5741

## 2017-02-18 HISTORY — PX: IR US GUIDE VASC ACCESS RIGHT: IMG2390

## 2017-02-18 LAB — CBC WITH DIFFERENTIAL/PLATELET
BASOS ABS: 0 10*3/uL (ref 0.0–0.1)
Basophils Relative: 0 %
EOS PCT: 7 %
Eosinophils Absolute: 0.3 10*3/uL (ref 0.0–0.7)
HEMATOCRIT: 28.5 % — AB (ref 36.0–46.0)
Hemoglobin: 9.2 g/dL — ABNORMAL LOW (ref 12.0–15.0)
LYMPHS PCT: 23 %
Lymphs Abs: 1.2 10*3/uL (ref 0.7–4.0)
MCH: 29.2 pg (ref 26.0–34.0)
MCHC: 32.3 g/dL (ref 30.0–36.0)
MCV: 90.5 fL (ref 78.0–100.0)
MONO ABS: 0.3 10*3/uL (ref 0.1–1.0)
MONOS PCT: 5 %
NEUTROS ABS: 3.3 10*3/uL (ref 1.7–7.7)
Neutrophils Relative %: 65 %
PLATELETS: 279 10*3/uL (ref 150–400)
RBC: 3.15 MIL/uL — ABNORMAL LOW (ref 3.87–5.11)
RDW: 13.1 % (ref 11.5–15.5)
WBC: 5.1 10*3/uL (ref 4.0–10.5)

## 2017-02-18 LAB — GLUCOSE, CAPILLARY: GLUCOSE-CAPILLARY: 162 mg/dL — AB (ref 65–99)

## 2017-02-18 LAB — PROTIME-INR
INR: 1.19
Prothrombin Time: 15 seconds (ref 11.4–15.2)

## 2017-02-18 MED ORDER — LIDOCAINE-EPINEPHRINE (PF) 2 %-1:200000 IJ SOLN
INTRAMUSCULAR | Status: AC | PRN
  Administered 2017-02-18: 10 mL via INTRADERMAL

## 2017-02-18 MED ORDER — NALOXONE HCL 0.4 MG/ML IJ SOLN
INTRAMUSCULAR | Status: AC
  Filled 2017-02-18: qty 1

## 2017-02-18 MED ORDER — SODIUM CHLORIDE 0.9 % IV SOLN
INTRAVENOUS | Status: DC
  Administered 2017-02-18: 13:00:00 via INTRAVENOUS

## 2017-02-18 MED ORDER — LIDOCAINE-EPINEPHRINE (PF) 2 %-1:200000 IJ SOLN
INTRAMUSCULAR | Status: AC
  Filled 2017-02-18: qty 20

## 2017-02-18 MED ORDER — FLUMAZENIL 0.5 MG/5ML IV SOLN
INTRAVENOUS | Status: AC
  Filled 2017-02-18: qty 5

## 2017-02-18 MED ORDER — CEFAZOLIN SODIUM-DEXTROSE 2-4 GM/100ML-% IV SOLN
INTRAVENOUS | Status: AC
Start: 2017-02-18 — End: 2017-02-19
  Filled 2017-02-18: qty 100

## 2017-02-18 MED ORDER — CEFAZOLIN SODIUM-DEXTROSE 2-4 GM/100ML-% IV SOLN
2.0000 g | INTRAVENOUS | Status: AC
  Administered 2017-02-18: 2 g via INTRAVENOUS

## 2017-02-18 MED ORDER — MIDAZOLAM HCL 2 MG/2ML IJ SOLN
INTRAMUSCULAR | Status: AC
  Filled 2017-02-18: qty 4

## 2017-02-18 MED ORDER — MIDAZOLAM HCL 2 MG/2ML IJ SOLN
INTRAMUSCULAR | Status: AC | PRN
  Administered 2017-02-18 (×2): 1 mg via INTRAVENOUS

## 2017-02-18 MED ORDER — HEPARIN SOD (PORK) LOCK FLUSH 100 UNIT/ML IV SOLN
INTRAVENOUS | Status: AC
Start: 2017-02-18 — End: 2017-02-19
  Filled 2017-02-18: qty 5

## 2017-02-18 MED ORDER — FENTANYL CITRATE (PF) 100 MCG/2ML IJ SOLN
INTRAMUSCULAR | Status: AC | PRN
  Administered 2017-02-18 (×2): 50 ug via INTRAVENOUS

## 2017-02-18 MED ORDER — FENTANYL CITRATE (PF) 100 MCG/2ML IJ SOLN
INTRAMUSCULAR | Status: AC
  Filled 2017-02-18: qty 4

## 2017-02-18 NOTE — Sedation Documentation (Signed)
Patient denies pain and is resting comfortably.  

## 2017-02-18 NOTE — H&P (Signed)
Chief Complaint: lung cancer  Referring Physician:Dr. Sullivan Lone  Supervising Physician: Arne Cleveland  Patient Status: [facility] - Out-pt  HPI: Marilyn [name] is a 52 y.o. female with metastatic lung cancer who has a PICC line in place for immunotherapy treatments.  A request has been made for a PAC placement so she no longer requires the use of her PICC line.  She otherwise is feeling well and has no complaints.  She is followed by Dr. Irene Limbo and her next round of treatment starts this coming Monday.  Past Medical History:  Past Medical History:  Diagnosis Date  . Asthma   . Diabetes mellitus   . Hypercholesteremia   . Hypertension   . Lung cancer ([place])   . Neuropathy   . Obese     Past Surgical History:  Past Surgical History:  Procedure Laterality Date  . CESAREAN SECTION    . IR GENERIC HISTORICAL  01/08/2016   IR US GUIDE VASC ACCESS RIGHT 01/08/2016 [name], DO [facility]  . IR GENERIC HISTORICAL  01/08/2016   IR FLUORO GUIDE CV LINE RIGHT 01/08/2016 [name], DO [facility]  . TUBAL LIGATION    . US guided core needle biopsy     of right lower neck/supraclavicular lymph nodes    Family History:  Family History  Problem Relation Age of Onset  . Heart failure Mother   . Hypertension Mother   . Cancer Neg Hx   . Rheumatologic disease Neg Hx     Social History:  reports that  has never smoked. she has never used smokeless tobacco. She reports that she does not drink alcohol or use drugs.  Allergies: No Known Allergies  Medications: Medications reviewed in epic  Please HPI for pertinent positives, otherwise complete 10 system ROS negative.  Mallampati Score: MD Evaluation Airway: WNL Heart: WNL Abdomen: WNL Chest/ Lungs: WNL ASA  Classification: 3 Mallampati/Airway Score: Two  Physical Exam: BP 131/90 (BP Location: Left Arm)   Pulse (!) 107   Temp 98.1 F (36.7 C) (Oral)   Resp 18   LMP 01/19/2016   SpO2 99%  There is no height or  weight on file to calculate BMI. General: pleasant, obese black female who is laying in bed in NAD HEENT: head is normocephalic, atraumatic.  Sclera are noninjected.  PERRL.  Ears and nose without any masses or lesions.  Mouth is pink and moist Heart: regular rhythm, but tachycardic.  Normal s1,s2. No obvious murmurs, gallops, or rubs noted.  Palpable radial pulses bilaterally Lungs: CTAB, no wheezes, rhonchi, or rales noted.  Respiratory effort nonlabored Abd: soft, NT, obese, +BS, no masses, hernias, or organomegaly Psych: A&Ox3 with an appropriate affect.   Labs: Results for orders placed or performed during the hospital encounter of 02/18/17 (from the past 48 hour(s))  CBC with Differential/Platelet     Status: Abnormal   Collection Time: 02/18/17 12:40 PM  Result Value Ref Range   WBC 5.1 4.0 - 10.5 K/uL   RBC 3.15 (L) 3.87 - 5.11 MIL/uL   Hemoglobin 9.2 (L) 12.0 - 15.0 g/dL   HCT 28.5 (L) 36.0 - 46.0 %   MCV 90.5 78.0 - 100.0 fL   MCH 29.2 26.0 - 34.0 pg   MCHC 32.3 30.0 - 36.0 g/dL   RDW 13.1 11.5 - 15.5 %   Platelets 279 150 - 400 K/uL   Neutrophils Relative % 65 %   Neutro Abs 3.3 1.7 - 7.7 K/uL  Lymphocytes Relative 23 %   Lymphs Abs 1.2 0.7 - 4.0 K/uL   Monocytes Relative 5 %   Monocytes Absolute 0.3 0.1 - 1.0 K/uL   Eosinophils Relative 7 %   Eosinophils Absolute 0.3 0.0 - 0.7 K/uL   Basophils Relative 0 %   Basophils Absolute 0.0 0.0 - 0.1 K/uL  Protime-INR     Status: None   Collection Time: 02/18/17 12:40 PM  Result Value Ref Range   Prothrombin Time 15.0 11.4 - 15.2 seconds   INR 1.19     Imaging: No results found.  Assessment/Plan 1. Metastatic lung cancer  We will place a PAC today to assist with her treatment plan.  We will plan to have her PICC line removed after her PAC is placed as well.  Her labs and vitals have been reviewed.  Risks and benefits discussed with the patient including, but not limited to bleeding, infection, pneumothorax, or fibrin  sheath development and need for additional procedures. All of the patient's questions were answered, patient is agreeable to proceed. Consent signed and in chart.  Thank you for this interesting consult.  I greatly enjoyed meeting Marilyn [name] and look forward to participating in their care.  A copy of this report was sent to the requesting provider on this date.  Electronically Signed: Henreitta Cea 02/18/2017, 1:46 PM   I spent a total of  30 Minutes  in face to face in clinical consultation, greater than 50% of which was counseling/coordinating care for metastatic lung cancer

## 2017-02-18 NOTE — Discharge Instructions (Signed)
In 24 hours may remove bandage from right arm and upper right chest. May shower. Skin glue will flake off. Call MD for redness, drainage, or swelling at incision sites. Call for fever or increased pain.  Call cancer center [phone] for any problems or questions.        Implanted Port Insertion, Care After This sheet gives you information about how to care for yourself after your procedure. Your health care provider may also give you more specific instructions. If you have problems or questions, contact your health care provider. What can I expect after the procedure? After your procedure, it is common to have:  Discomfort at the port insertion site.  Bruising on the skin over the port. This should improve over 3-4 days.  Follow these instructions at home: [facility] care  After your port is placed, you will get a manufacturer's information card. The card has information about your port. Keep this card with you at all times.  Take care of the port as told by your health care provider. Ask your health care provider if you or a family member can get training for taking care of the port at home. A home health care nurse may also take care of the port.  Make sure to remember what type of port you have. Incision care  Follow instructions from your health care provider about how to take care of your port insertion site. Make sure you: ? Wash your hands with soap and water before you change your bandage (dressing). If soap and water are not available, use hand sanitizer. ? Change your dressing as told by your health care provider. ? Leave stitches (sutures), skin glue, or adhesive strips in place. These skin closures may need to stay in place for 2 weeks or longer. If adhesive strip edges start to loosen and curl up, you may trim the loose edges. Do not remove adhesive strips completely unless your health care provider tells you to do that.  Check your port insertion site every day for signs of  infection. Check for: ? More redness, swelling, or pain. ? More fluid or blood. ? Warmth. ? Pus or a bad smell. General instructions  Do not take baths, swim, or use a hot tub until your health care provider approves.  Do not lift anything that is heavier than 10 lb (4.5 kg) for a week, or as told by your health care provider.  Ask your health care provider when it is okay to: ? Return to work or school. ? Resume usual physical activities or sports.  Do not drive for 24 hours if you were given a medicine to help you relax (sedative).  Take over-the-counter and prescription medicines only as told by your health care provider.  Wear a medical alert bracelet in case of an emergency. This will tell any health care providers that you have a port.  Keep all follow-up visits as told by your health care provider. This is important. Contact a health care provider if:  You cannot flush your port with saline as directed, or you cannot draw blood from the port.  You have a fever or chills.  You have more redness, swelling, or pain around your port insertion site.  You have more fluid or blood coming from your port insertion site.  Your port insertion site feels warm to the touch.  You have pus or a bad smell coming from the port insertion site. Get help right away if:  You have  chest pain or shortness of breath.  You have bleeding from your port that you cannot control. Summary  Take care of the port as told by your health care provider.  Change your dressing as told by your health care provider.  Keep all follow-up visits as told by your health care provider. This information is not intended to replace advice given to you by your health care provider. Make sure you discuss any questions you have with your health care provider. Document Released: 01/10/2013 Document Revised: 02/11/2016 Document Reviewed: 02/11/2016 Elsevier Interactive Patient Education  2017 [employer] An implanted port is a type of central line that is placed under the skin. Central lines are used to provide IV access when treatment or nutrition needs to be given through a persons veins. Implanted ports are used for long-term IV access. An implanted port may be placed because:  You need IV medicine that would be irritating to the small veins in your hands or arms.  You need long-term IV medicines, such as antibiotics.  You need IV nutrition for a long period.  You need frequent blood draws for lab tests.  You need dialysis.  Implanted ports are usually placed in the chest area, but they can also be placed in the upper arm, the abdomen, or the leg. An implanted port has two main parts:  Reservoir. The reservoir is round and will appear as a small, raised area under your skin. The reservoir is the part where a needle is inserted to give medicines or draw blood.  Catheter. The catheter is a thin, flexible tube that extends from the reservoir. The catheter is placed into a large vein. Medicine that is inserted into the reservoir goes into the catheter and then into the vein.  How will I care for my incision site? Do not get the incision site wet. Bathe or shower as directed by your health care provider. How is my port accessed? Special steps must be taken to access the port:  Before the port is accessed, a numbing cream can be placed on the skin. This helps numb the skin over the port site.  Your health care provider uses a sterile technique to access the port. ? Your health care provider must put on a mask and sterile gloves. ? The skin over your port is cleaned carefully with an antiseptic and allowed to dry. ? The port is gently pinched between sterile gloves, and a needle is inserted into the port.  Only "non-coring" port needles should be used to access the port. Once the port is accessed, a blood return should be checked. This helps  ensure that the port is in the vein and is not clogged.  If your port needs to remain accessed for a constant infusion, a clear (transparent) bandage will be placed over the needle site. The bandage and needle will need to be changed every week, or as directed by your health care provider.  Keep the bandage covering the needle clean and dry. Do not get it wet. Follow your health care providers instructions on how to take a shower or bath while the port is accessed.  If your port does not need to stay accessed, no bandage is needed over the port.  What is flushing? Flushing helps keep the port from getting clogged. Follow your health care providers instructions on how and when to flush the port. Ports are usually flushed with saline solution  or a medicine called heparin. The need for flushing will depend on how the port is used.  If the port is used for intermittent medicines or blood draws, the port will need to be flushed: ? After medicines have been given. ? After blood has been drawn. ? As part of routine maintenance.  If a constant infusion is running, the port may not need to be flushed.  How long will my port stay implanted? The port can stay in for as long as your health care provider thinks it is needed. When it is time for the port to come out, surgery will be done to remove it. The procedure is similar to the one performed when the port was put in. When should I seek immediate medical care? When you have an implanted port, you should seek immediate medical care if:  You notice a bad smell coming from the incision site.  You have swelling, redness, or drainage at the incision site.  You have more swelling or pain at the port site or the surrounding area.  You have a fever that is not controlled with medicine.  This information is not intended to replace advice given to you by your health care provider. Make sure you discuss any questions you have with your health care  provider. Document Released: 03/22/2005 Document Revised: 08/28/2015 Document Reviewed: 11/27/2012 Elsevier Interactive Patient Education  2017 [employer].      Moderate Conscious Sedation, Adult, Care After These instructions provide you with information about caring for yourself after your procedure. Your health care provider may also give you more specific instructions. Your treatment has been planned according to current medical practices, but problems sometimes occur. Call your health care provider if you have any problems or questions after your procedure. What can I expect after the procedure? After your procedure, it is common:  To feel sleepy for several hours.  To feel clumsy and have poor balance for several hours.  To have poor judgment for several hours.  To vomit if you eat too soon.  Follow these instructions at home: For at least 24 hours after the procedure:   Do not: ? Participate in activities where you could fall or become injured. ? Drive. ? Use heavy machinery. ? Drink alcohol. ? Take sleeping pills or medicines that cause drowsiness. ? Make important decisions or sign legal documents. ? Take care of children on your own.  Rest. Eating and drinking  Follow the diet recommended by your health care provider.  If you vomit: ? Drink water, juice, or soup when you can drink without vomiting. ? Make sure you have little or no nausea before eating solid foods. General instructions  Have a responsible adult stay with you until you are awake and alert.  Take over-the-counter and prescription medicines only as told by your health care provider.  If you smoke, do not smoke without supervision.  Keep all follow-up visits as told by your health care provider. This is important. Contact a health care provider if:  You keep feeling nauseous or you keep vomiting.  You feel light-headed.  You develop a rash.  You have a fever. Get help right away  if:  You have trouble breathing. This information is not intended to replace advice given to you by your health care provider. Make sure you discuss any questions you have with your health care provider. Document Released: 01/10/2013 Document Revised: 08/25/2015 Document Reviewed: 07/12/2015 Elsevier Interactive Patient Education  [employer].

## 2017-02-18 NOTE — Procedures (Signed)
  Procedure: R IJ Port catheter placement   Preprocedure diagnosis: Lung CA Postprocedure diagnosis: same EBL:   minimal Complications:  none immediate  See full dictation in [employer].  Dillard Cannon MD Main # [phone] Pager  [phone]

## 2017-02-19 ENCOUNTER — Encounter (HOSPITAL_COMMUNITY): Payer: Self-pay | Admitting: Interventional Radiology

## 2017-02-21 ENCOUNTER — Encounter: Payer: Self-pay | Admitting: Hematology

## 2017-02-21 ENCOUNTER — Ambulatory Visit: Payer: Medicaid Other

## 2017-02-21 ENCOUNTER — Telehealth: Payer: Self-pay | Admitting: Hematology

## 2017-02-21 ENCOUNTER — Ambulatory Visit (HOSPITAL_BASED_OUTPATIENT_CLINIC_OR_DEPARTMENT_OTHER): Payer: Medicaid Other | Admitting: Hematology

## 2017-02-21 ENCOUNTER — Ambulatory Visit (HOSPITAL_BASED_OUTPATIENT_CLINIC_OR_DEPARTMENT_OTHER): Payer: Medicaid Other

## 2017-02-21 ENCOUNTER — Other Ambulatory Visit (HOSPITAL_BASED_OUTPATIENT_CLINIC_OR_DEPARTMENT_OTHER): Payer: Medicaid Other

## 2017-02-21 VITALS — BP 113/79 | HR 114 | Temp 97.7°F | Resp 20 | Ht 69.5 in | Wt 271.8 lb

## 2017-02-21 DIAGNOSIS — E1165 Type 2 diabetes mellitus with hyperglycemia: Secondary | ICD-10-CM | POA: Diagnosis not present

## 2017-02-21 DIAGNOSIS — C3491 Malignant neoplasm of unspecified part of right bronchus or lung: Secondary | ICD-10-CM

## 2017-02-21 DIAGNOSIS — C770 Secondary and unspecified malignant neoplasm of lymph nodes of head, face and neck: Secondary | ICD-10-CM

## 2017-02-21 DIAGNOSIS — I10 Essential (primary) hypertension: Secondary | ICD-10-CM

## 2017-02-21 DIAGNOSIS — Z5112 Encounter for antineoplastic immunotherapy: Secondary | ICD-10-CM

## 2017-02-21 DIAGNOSIS — Z794 Long term (current) use of insulin: Secondary | ICD-10-CM | POA: Diagnosis not present

## 2017-02-21 DIAGNOSIS — C792 Secondary malignant neoplasm of skin: Secondary | ICD-10-CM

## 2017-02-21 DIAGNOSIS — Z95828 Presence of other vascular implants and grafts: Secondary | ICD-10-CM

## 2017-02-21 DIAGNOSIS — Z452 Encounter for adjustment and management of vascular access device: Secondary | ICD-10-CM

## 2017-02-21 DIAGNOSIS — Z23 Encounter for immunization: Secondary | ICD-10-CM

## 2017-02-21 DIAGNOSIS — E032 Hypothyroidism due to medicaments and other exogenous substances: Secondary | ICD-10-CM

## 2017-02-21 DIAGNOSIS — E279 Disorder of adrenal gland, unspecified: Secondary | ICD-10-CM

## 2017-02-21 DIAGNOSIS — E039 Hypothyroidism, unspecified: Secondary | ICD-10-CM

## 2017-02-21 LAB — COMPREHENSIVE METABOLIC PANEL
ALT: 8 U/L (ref 0–55)
ANION GAP: 7 meq/L (ref 3–11)
AST: 10 U/L (ref 5–34)
Albumin: 2.9 g/dL — ABNORMAL LOW (ref 3.5–5.0)
Alkaline Phosphatase: 74 U/L (ref 40–150)
BUN: 5 mg/dL — ABNORMAL LOW (ref 7.0–26.0)
CHLORIDE: 105 meq/L (ref 98–109)
CO2: 28 meq/L (ref 22–29)
CREATININE: 0.8 mg/dL (ref 0.6–1.1)
Calcium: 8.7 mg/dL (ref 8.4–10.4)
Glucose: 230 mg/dl — ABNORMAL HIGH (ref 70–140)
POTASSIUM: 3.8 meq/L (ref 3.5–5.1)
Sodium: 139 mEq/L (ref 136–145)
Total Bilirubin: 0.25 mg/dL (ref 0.20–1.20)
Total Protein: 7.3 g/dL (ref 6.4–8.3)

## 2017-02-21 LAB — CBC & DIFF AND RETIC
BASO%: 0.2 % (ref 0.0–2.0)
Basophils Absolute: 0 10*3/uL (ref 0.0–0.1)
EOS%: 6.9 % (ref 0.0–7.0)
Eosinophils Absolute: 0.4 10*3/uL (ref 0.0–0.5)
HCT: 28.7 % — ABNORMAL LOW (ref 34.8–46.6)
HGB: 9.2 g/dL — ABNORMAL LOW (ref 11.6–15.9)
Immature Retic Fract: 5.2 % (ref 1.60–10.00)
LYMPH%: 20.1 % (ref 14.0–49.7)
MCH: 29.2 pg (ref 25.1–34.0)
MCHC: 32.1 g/dL (ref 31.5–36.0)
MCV: 91.1 fL (ref 79.5–101.0)
MONO#: 0.3 10*3/uL (ref 0.1–0.9)
MONO%: 4.6 % (ref 0.0–14.0)
NEUT#: 3.7 10*3/uL (ref 1.5–6.5)
NEUT%: 68.2 % (ref 38.4–76.8)
PLATELETS: 228 10*3/uL (ref 145–400)
RBC: 3.15 10*6/uL — AB (ref 3.70–5.45)
RDW: 12.9 % (ref 11.2–14.5)
RETIC %: 1.48 % (ref 0.70–2.10)
RETIC CT ABS: 46.62 10*3/uL (ref 33.70–90.70)
WBC: 5.4 10*3/uL (ref 3.9–10.3)
lymph#: 1.1 10*3/uL (ref 0.9–3.3)

## 2017-02-21 MED ORDER — LIDOCAINE-PRILOCAINE 2.5-2.5 % EX CREA: 1.0000 "application " | TOPICAL_CREAM | CUTANEOUS | 6 refills | Status: DC | PRN

## 2017-02-21 MED ORDER — SODIUM CHLORIDE 0.9 % IJ SOLN
10.0000 mL | INTRAMUSCULAR | Status: DC | PRN
  Administered 2017-02-21: 10 mL via INTRAVENOUS
  Filled 2017-02-21: qty 10

## 2017-02-21 MED ORDER — SODIUM CHLORIDE 0.9 % IV SOLN
Freq: Once | INTRAVENOUS | Status: AC
  Administered 2017-02-21: 10:00:00 via INTRAVENOUS

## 2017-02-21 MED ORDER — HEPARIN SOD (PORK) LOCK FLUSH 100 UNIT/ML IV SOLN
500.0000 [IU] | Freq: Once | INTRAVENOUS | Status: AC | PRN
  Administered 2017-02-21: 500 [IU]
  Filled 2017-02-21: qty 5

## 2017-02-21 MED ORDER — SODIUM CHLORIDE 0.9% FLUSH
10.0000 mL | INTRAVENOUS | Status: DC | PRN
  Administered 2017-02-21: 10 mL
  Filled 2017-02-21: qty 10

## 2017-02-21 MED ORDER — SODIUM CHLORIDE 0.9 % IV SOLN
240.0000 mg | Freq: Once | INTRAVENOUS | Status: AC
  Administered 2017-02-21: 240 mg via INTRAVENOUS
  Filled 2017-02-21: qty 24

## 2017-02-21 MED ORDER — INFLUENZA VAC SPLIT QUAD 0.5 ML IM SUSY
0.5000 mL | PREFILLED_SYRINGE | Freq: Once | INTRAMUSCULAR | Status: AC
  Administered 2017-02-21: 0.5 mL via INTRAMUSCULAR
  Filled 2017-02-21: qty 0.5

## 2017-02-21 NOTE — Patient Instructions (Signed)
[facility] Discharge Instructions for Patients Receiving Chemotherapy  Today you received the following chemotherapy agents: Nivolumab  To help prevent nausea and vomiting after your treatment, we encourage you to take your nausea medication as directed.    If you develop nausea and vomiting that is not controlled by your nausea medication, call the clinic.   BELOW ARE SYMPTOMS THAT SHOULD BE REPORTED IMMEDIATELY:  *FEVER GREATER THAN 100.5 F  *CHILLS WITH OR WITHOUT FEVER  NAUSEA AND VOMITING THAT IS NOT CONTROLLED WITH YOUR NAUSEA MEDICATION  *UNUSUAL SHORTNESS OF BREATH  *UNUSUAL BRUISING OR BLEEDING  TENDERNESS IN MOUTH AND THROAT WITH OR WITHOUT PRESENCE OF ULCERS  *URINARY PROBLEMS  *BOWEL PROBLEMS  UNUSUAL RASH Items with * indicate a potential emergency and should be followed up as soon as possible.  Feel free to call the clinic should you have any questions or concerns. The clinic phone number is [phone].  Please show the CHEMO ALERT CARD at check-in to the Emergency Department and triage nurse.   

## 2017-02-21 NOTE — Progress Notes (Signed)
0930: Okay to treat with HR of 114 per Aldona Bar RN per DR. Irene Limbo.

## 2017-02-21 NOTE — Progress Notes (Signed)
Marilyn [name]    HEMATOLOGY/ONCOLOGY CLINIC NOTE  Date of Service: 02/21/2017   Patient Care Team: [name], FNP as PCP - General (Nurse Practitioner)  CHIEF COMPLAINTS/PURPOSE OF CONSULTATION:  f/u Lung Adenocarcinoma.  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  Current treatment:  Nivolumab q2 weeks  PreviousTreatment -Concurrent Chemo-radiation with Carboplatin/Taxol. Completed RT on 02/13/2016 and has then completed 2 cycles of carboplatin + taxol. -Started Maintainence Durvalumab from  05/24/2016 q2weeks, switched to Nivolumab q2weeks on 12/13/16 once metastatic disease noted -s/p palliative RT to rt sided Subcutaneous metastatic mass.    HISTORY OF PRESENTING ILLNESS: Plz see my previous note for details on initial presentation.  INTERVAL HISTORY   Of note since pt last visit, she underwent IR US on 02/18/2017 for port catheter placement. Pt presents to the office today for followup of her metastatic lung cancer. She is receiving her 6th cycle of Nivolumab today. She reports that she is doing well overall and her energy level is still well. Since her last visit she is taking her levothyroxine regularly and reports no acute new concerns. She states that the right flank soft tissue mass has continued to shrink despite some tingling sensation in the area.    On review of systems, pt  denies skin rashes, fever, chills, night sweats, diarrhea,  Dysuria, abdominal pain and any other accompanying symptoms.     MEDICAL HISTORY:  Past Medical History:  Diagnosis Date  . Asthma   . Diabetes mellitus   . Hypercholesteremia   . Hypertension   . Lung cancer ([place])   . Neuropathy   . Obese     SURGICAL HISTORY: Past Surgical History:  Procedure Laterality Date  . CESAREAN SECTION    . IR FLUORO GUIDE PORT INSERTION RIGHT  02/18/2017  . IR GENERIC HISTORICAL  01/08/2016   IR US GUIDE VASC ACCESS RIGHT 01/08/2016 [name], DO [facility]  RAD  . IR GENERIC HISTORICAL  01/08/2016   IR FLUORO GUIDE CV LINE RIGHT 01/08/2016 [name], DO [facility]  . IR US GUIDE VASC ACCESS RIGHT  02/18/2017  . TUBAL LIGATION    . US guided core needle biopsy     of right lower neck/supraclavicular lymph nodes    SOCIAL HISTORY: Social History   Socioeconomic History  . Marital status: Divorced    Spouse name: Not on file  . Number of children: Not on file  . Years of education: Not on file  . Highest education level: Not on file  Social Needs  . Financial resource strain: Not on file  . Food insecurity - worry: Not on file  . Food insecurity - inability: Not on file  . Transportation needs - medical: Not on file  . Transportation needs - non-medical: Not on file  Occupational History  . Not on file  Tobacco Use  . Smoking status: Never Smoker  . Smokeless tobacco: Never Used  Substance and Sexual Activity  . Alcohol use: No  . Drug use: No  . Sexual activity: Not Currently  Other Topics Concern  . Not on file  Social History Narrative   No bird or mold exposure. No recent travel.    FAMILY HISTORY: Family History  Problem Relation Age of Onset  . Heart failure Mother   . Hypertension Mother   . Cancer Neg Hx   . Rheumatologic disease Neg Hx     ALLERGIES:  has No Known Allergies.  MEDICATIONS:  Current Outpatient Medications  Medication  Sig Dispense Refill  . aspirin EC 81 MG tablet Take 1 tablet (81 mg total) by mouth daily. 60 tablet 2  . benzonatate (TESSALON) 100 MG capsule Take 1 capsule (100 mg total) by mouth 3 (three) times daily as needed for cough. 60 capsule 0  . carvedilol (COREG) 6.25 MG tablet Take 1 tablet (6.25 mg total) by mouth 2 (two) times daily. 180 tablet 3  . DULoxetine (CYMBALTA) 60 MG capsule Take 1 capsule (60 mg total) by mouth daily. 30 capsule 2  . insulin aspart (NOVOLOG) 100 UNIT/ML FlexPen As per sliding scale instructions (Patient taking differently: Inject 0-20 Units into  the skin 3 (three) times daily as needed for high blood sugar. As per sliding scale instructions) 15 mL 2  . Insulin Degludec (TRESIBA FLEXTOUCH) 200 UNIT/ML SOPN Inject 100 Units into the skin daily before breakfast.     . levothyroxine (SYNTHROID, LEVOTHROID) 125 MCG tablet Take 1 tablet (125 mcg total) by mouth daily before breakfast. 30 tablet 2  . loratadine (CLARITIN) 10 MG tablet Take 10 mg by mouth daily.    Marilyn [name] losartan (COZAAR) 25 MG tablet Take 1 tablet (25 mg total) by mouth daily. 90 tablet 3  . omeprazole (PRILOSEC) 20 MG capsule Take 1 capsule (20 mg total) by mouth daily. 30 capsule 0  . oxyCODONE-acetaminophen (PERCOCET/ROXICET) 5-325 MG tablet Take 1-2 tablets by mouth every 6 (six) hours as needed for moderate pain or severe pain. 40 tablet 0  . polyethylene glycol (MIRALAX) packet Take 17 g by mouth daily. 30 each 1  . senna-docusate (SENNA S) 8.6-50 MG tablet Take 2 tablets by mouth 2 (two) times daily. May reduce to 2 tab po HS as needed once regular BM estabished 60 tablet 1  . lidocaine-prilocaine (EMLA) cream Apply 1 application as needed topically. 30 g 6   No current facility-administered medications for this visit.    Facility-Administered Medications Ordered in Other Visits  Medication Dose Route Frequency Provider Last Rate Last Dose  . sodium chloride 0.9 % injection 10 mL  10 mL Intravenous PRN [name], MD   10 mL at 05/24/16 1252    REVIEW OF SYSTEMS:    10 Point review of Systems was done is negative except as noted above.  PHYSICAL EXAMINATION: ECOG PERFORMANCE STATUS: 1 - Symptomatic but completely ambulatory  . Vitals:   02/21/17 0903  BP: 113/79  Pulse: (!) 114  Resp: 20  Temp: 97.7 F (36.5 C)  SpO2: 100%   Filed Weights   02/21/17 0903  Weight: 271 lb 12.8 oz (123.3 kg)   .Body mass index is 39.56 kg/m.  GENERAL:alert, in no acute distress and comfortable SKIN: radiation related skin injury over neck - healing EYES:  normal, conjunctiva are pink and non-injected, sclera clear OROPHARYNX:oral thrush noted NECK: supple, no JVD, thyroid normal size, non-tender, without nodularity LYMPH:  no palpable lymphadenopathy in the cervical, axillary or inguinal.             LUNGS: clear to auscultation with normal respiratory effort. Distant breath sounds  HEART: regular rate & rhythm,  no murmurs and no lower extremity edema ABDOMEN: abdomen obese, soft, non-tender, normoactive bowel sounds , rt flank subcutaneous mass palpable. Musculoskeletal: no cyanosis of digits and no clubbing  PSYCH: alert & oriented x 3 with fluent speech NEURO: no focal motor/sensory deficits  LABORATORY DATA: .  Marilyn [name] CBC Latest Ref Rng & Units 02/21/2017 02/18/2017 02/07/2017  WBC 3.9 - 10.3 10e3/uL 5.4 5.1  4.5  Hemoglobin 11.6 - 15.9 g/dL 9.2(L) 9.2(L) 9.3(L)  Hematocrit 34.8 - 46.6 % 28.7(L) 28.5(L) 28.8(L)  Platelets 145 - 400 10e3/uL 228 279 257   . CMP Latest Ref Rng & Units 02/21/2017 02/07/2017 01/24/2017  Glucose 70 - 140 mg/dl 230(H) 233(H) 80  BUN 7.0 - 26.0 mg/dL 5.0(L) 6.8(L) 8.2  Creatinine 0.6 - 1.1 mg/dL 0.8 0.8 0.8  Sodium 136 - 145 mEq/L 139 138 141  Potassium 3.5 - 5.1 mEq/L 3.8 3.9 4.0  Chloride 101 - 111 mmol/L - - -  CO2 22 - 29 mEq/L '28 28 29  ' Calcium 8.4 - 10.4 mg/dL 8.7 8.9 9.0  Total Protein 6.4 - 8.3 g/dL 7.3 7.7 7.7  Total Bilirubin 0.20 - 1.20 mg/dL 0.25 0.32 0.29  Alkaline Phos 40 - 150 U/L 74 80 88  AST 5 - 34 U/L '10 13 14  ' ALT 0 - 55 U/L '8 9 8                ' RADIOGRAPHIC STUDIES:  .Ir US Guide Vasc Access Right  Result Date: 02/19/2017 CLINICAL DATA:  Lung cancer, needs durable venous access for chemotherapy regimen. EXAM: TUNNELED PORT CATHETER PLACEMENT WITH ULTRASOUND AND FLUOROSCOPIC GUIDANCE FLUOROSCOPY TIME:  0.1 minute, 48 uGym2 DAP ANESTHESIA/SEDATION: Intravenous Fentanyl and Versed were administered as conscious sedation during continuous monitoring of the patient's level of  consciousness and physiological / cardiorespiratory status by the radiology RN, with a total moderate sedation time of 15 minutes. TECHNIQUE: The procedure, risks, benefits, and alternatives were explained to the patient. Questions regarding the procedure were encouraged and answered. The patient understands and consents to the procedure. As antibiotic prophylaxis, cefazolin 2 g was ordered pre-procedure and administered intravenously within one hour of incision. Patency of the right IJ vein was confirmed with ultrasound with image documentation. An appropriate skin site was determined. Skin site was marked. Region was prepped using maximum barrier technique including cap and mask, sterile gown, sterile gloves, large sterile sheet, and Chlorhexidine as cutaneous antisepsis. The region was infiltrated locally with 1% lidocaine. Under real-time ultrasound guidance, the right IJ vein was accessed with a 21 gauge micropuncture needle; the needle tip within the vein was confirmed with ultrasound image documentation. Needle was exchanged over a 018 guidewire for transitional dilator which allowed passage of the [facility] wire into the IVC. Over this, the transitional dilator was exchanged for a 5 [place] MPA catheter. A small incision was made on the right anterior chest wall and a subcutaneous pocket fashioned. The power-injectable port was positioned and its catheter tunneled to the right IJ dermatotomy site. The MPA catheter was exchanged over an Amplatz wire for a peel-away sheath, through which the port catheter, which had been trimmed to the appropriate length, was advanced and positioned under fluoroscopy with its tip at the cavoatrial junction. Spot chest radiograph confirms good catheter position and no pneumothorax. The pocket was closed with deep interrupted and subcuticular continuous 3-0 Monocryl sutures. The port was flushed per protocol. The incisions were covered with Dermabond then covered with a sterile  dressing. COMPLICATIONS: COMPLICATIONS None immediate IMPRESSION: Technically successful right IJ power-injectable port catheter placement. Ready for routine use. Electronically Signed   By: Lucrezia Europe M.D.   On: 02/19/2017 10:39   Ir Fluoro Guide Port Insertion Right  Result Date: 02/19/2017 CLINICAL DATA:  Lung cancer, needs durable venous access for chemotherapy regimen. EXAM: TUNNELED PORT CATHETER PLACEMENT WITH ULTRASOUND AND FLUOROSCOPIC GUIDANCE FLUOROSCOPY TIME:  0.1 minute, 48 uGym2  DAP ANESTHESIA/SEDATION: Intravenous Fentanyl and Versed were administered as conscious sedation during continuous monitoring of the patient's level of consciousness and physiological / cardiorespiratory status by the radiology RN, with a total moderate sedation time of 15 minutes. TECHNIQUE: The procedure, risks, benefits, and alternatives were explained to the patient. Questions regarding the procedure were encouraged and answered. The patient understands and consents to the procedure. As antibiotic prophylaxis, cefazolin 2 g was ordered pre-procedure and administered intravenously within one hour of incision. Patency of the right IJ vein was confirmed with ultrasound with image documentation. An appropriate skin site was determined. Skin site was marked. Region was prepped using maximum barrier technique including cap and mask, sterile gown, sterile gloves, large sterile sheet, and Chlorhexidine as cutaneous antisepsis. The region was infiltrated locally with 1% lidocaine. Under real-time ultrasound guidance, the right IJ vein was accessed with a 21 gauge micropuncture needle; the needle tip within the vein was confirmed with ultrasound image documentation. Needle was exchanged over a 018 guidewire for transitional dilator which allowed passage of the [facility] wire into the IVC. Over this, the transitional dilator was exchanged for a 5 [place] MPA catheter. A small incision was made on the right anterior chest wall and a  subcutaneous pocket fashioned. The power-injectable port was positioned and its catheter tunneled to the right IJ dermatotomy site. The MPA catheter was exchanged over an Amplatz wire for a peel-away sheath, through which the port catheter, which had been trimmed to the appropriate length, was advanced and positioned under fluoroscopy with its tip at the cavoatrial junction. Spot chest radiograph confirms good catheter position and no pneumothorax. The pocket was closed with deep interrupted and subcuticular continuous 3-0 Monocryl sutures. The port was flushed per protocol. The incisions were covered with Dermabond then covered with a sterile dressing. COMPLICATIONS: COMPLICATIONS None immediate IMPRESSION: Technically successful right IJ power-injectable port catheter placement. Ready for routine use. Electronically Signed   By: Lucrezia Europe M.D.   On: 02/19/2017 10:39     ASSESSMENT & PLAN:   51 year old African-American female with history of hypertension, diabetes, asthma, dysuria mass and morbid obesity with    #1 h/o Lung Adenocarcinoma Rt sided atleast Stage IIIB (on diagnosis) with large right paratracheal mass with mediastinal adenopathy that appears to have grown significantly over the last 6-7 months and rt supraclavicular LN +.  -Noted to have a small mass in the left adrenal on CT but PET/CT neg for metastatic disease. Patient has been a lifelong nonsmoker -MRI of the brain was negative for any metastatic disease. At diagnosis. -High PDL1 expression (90%) on [facility] One Neg for EGFR, ALK, ROS-1 and BRAF mutations. -Patient completed her planned definitive chemoradiation with carbo Taxol on 02/13/2016. No prohibitive toxicities other than some grade 1 skin desquamation and some grade 1-2 radiation esophagitis. She has subsequently received 2 out of 2 planned dose of carboplatin + Taxol. -Patient has completed 11 doses of maintenance   -CTA chest 10/29/2016  no evidence of lung cancer  progression in the chest. No PE -CT abd/pelvis-10/29/2016  Interval 5.0 x 5.0 x 4.7 cm mass in the right lateral subcutaneous fat at the level of the mid abdomen. This has CT features compatible with a metastasis or primary neoplasm.  #2 Now with Metastatic lung adenocarcinoma with isolated metastases in the right lateral subcutaneous fat at the level of the midabdomen. she received maintainence Durvalumab from  05/24/2016 q2weeks, switched to Nivolumab q2weeks on 12/13/16 in the setting of Biopsy proven isolated metastatic  disease. Given the tumor is strongly PDL1 positive and patient has only isolated metastatic disease was treated with palliative RT to metastases with significant improvement. Plan -Patient reports no symptoms suggestive of disease progression at this time. -No prohibitive toxicities from Nivolumab. -lab stable -continue Nivolumab at this time in the absence of overt disease progression or prohibitive toxicities. - Continue when necessary Percocet for pain management  -will need rpt imaging every 4 months. Would plan in Jan 2019  #3 h/o radiation pneumonitis - no new symptoms. Does has some cough with exertion but this has improved.  #4 Improving fatigue (grade 1)  -working with outpatient PT program - optimize DM2 management with PCP - improved fatigue with levothyroxine compliance with near normalization of FT4 levels.  Has been off her thyroid replacement for 1 week and has now restarted. - Continue Levolthyroxine 162mg ---will likely need larger dose but in the setting of baseline tachycardia we are gradually increasing dose to avoid ppt cardiac arrhythmias.  FT4 today 0.73  #5 normocytic anemia related to chemotherapy -resolving and stable Hg at 9.3  #6 Left chest wall pain due to costochondritis - mx with pain medications. CTA x 2 neg for PE. No significant pain at this time.   #7 Sinus tachycardia - on BB per cards , no PE on CTA x 2  -Still having sinus tachycardia  . Partly from deconditioning and anemia. Seeing Dr. HDebara Pickettfrom cardiology  -Continues to be on Coreg  #8 neoplasm related pain is currently controlled without significant medications. Clinically likely has sleep apnea . Would need to be careful with sedative medications . 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 Please schedule next 3 doses. -RTC with Dr KaIrene Limbon 4 weeks -continue Labs q2weeks with treatment PCV in 2 months   All of the patients questions were answered with apparent satisfaction. The patient knows to call the clinic with any problems, questions or concerns.  I spent 20 minutes counseling the patient face to face. The total time spent in the appointment was 25 minutes and more than 50% was on counseling and direct patient cares.    GaSullivan LoneD MSBerryAHIVMS SCApollo HospitalTGen[facility] Aledoematology/Oncology Physician [place](Office):       [phone] cell):  [phone]):           [phone] This document serves as a record of services personally performed by GaSullivan LoneMD. It was created on her behalf by LaAlean Rinnea trained medical scribe. The creation of this record is based on the scribe's personal observations and the provider's statements to them.    .I have reviewed the above documentation for accuracy and completeness, and I agree with the above. .GBrunetta GeneraD MS

## 2017-02-21 NOTE — Telephone Encounter (Signed)
Gave avs and calendar for December  °

## 2017-02-22 LAB — T4, FREE: FREE T4: 0.73 ng/dL — AB (ref 0.82–1.77)

## 2017-02-28 ENCOUNTER — Ambulatory Visit: Payer: Medicaid Other | Admitting: Hematology

## 2017-03-05 IMAGING — CR DG ABDOMEN ACUTE W/ 1V CHEST
1 series · 1 of 1 positions shown · non-contrast
Comparison: None.

CLINICAL DATA: Abdominal pain

EXAM:
DG ABDOMEN ACUTE W/ 1V CHEST

[t abdomen supine]
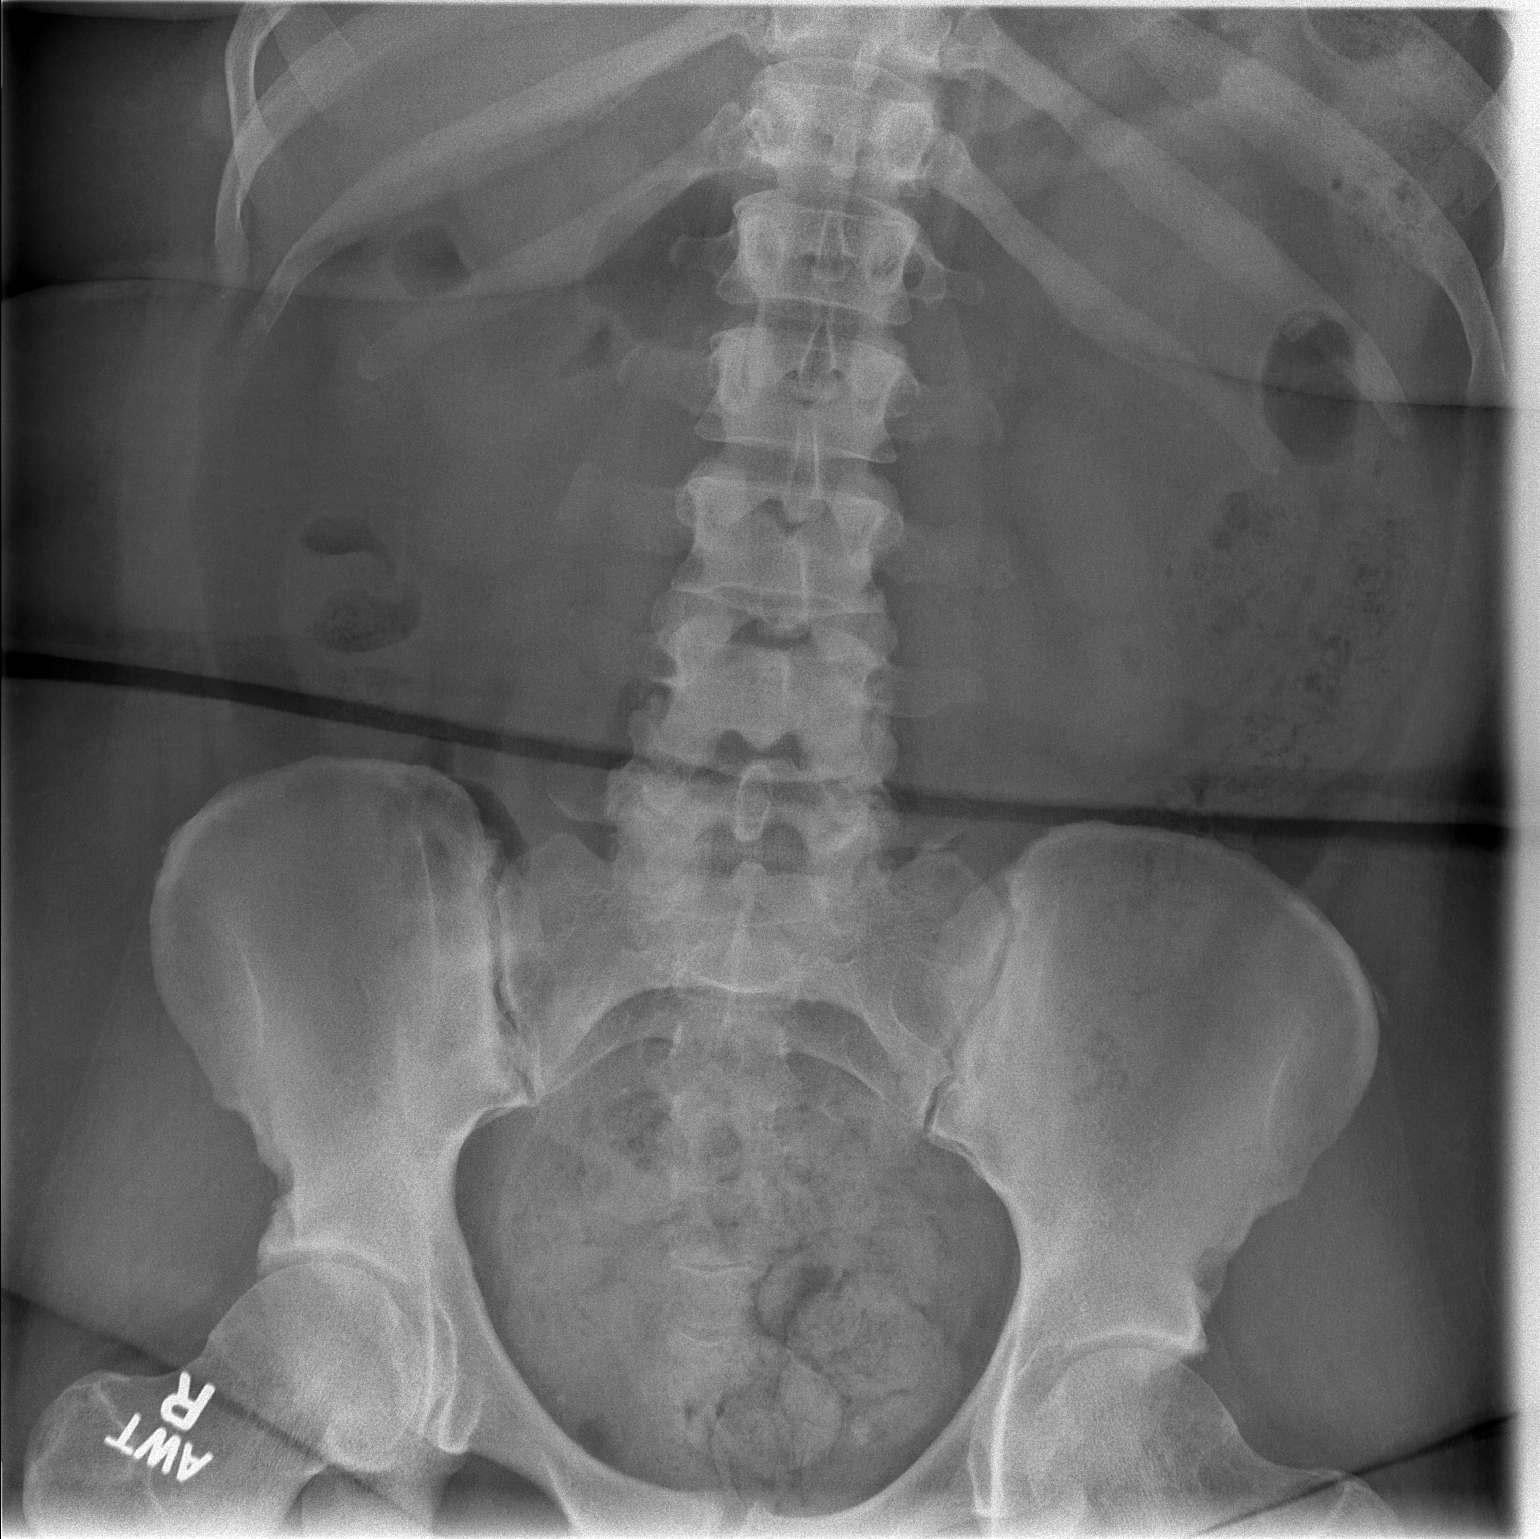

[1 of 1 positions shown; findings below may reference images not displayed]

FINDINGS: There is no evidence of dilated bowel loops or free intraperitoneal
air. No radiopaque calculi or other significant radiographic
abnormality is seen. Heart size is normal. Right paratracheal
lymphadenopathy. Both lungs are clear.

Right-sided PICC line with the tip projecting over the SVC.
IMPRESSION: Negative abdominal radiographs.  No acute cardiopulmonary disease.

Right paratracheal lymphadenopathy.

## 2017-03-07 ENCOUNTER — Other Ambulatory Visit (HOSPITAL_BASED_OUTPATIENT_CLINIC_OR_DEPARTMENT_OTHER): Payer: Medicaid Other

## 2017-03-07 ENCOUNTER — Ambulatory Visit (HOSPITAL_BASED_OUTPATIENT_CLINIC_OR_DEPARTMENT_OTHER): Payer: Medicaid Other

## 2017-03-07 VITALS — BP 109/79 | HR 110 | Temp 97.7°F | Resp 17

## 2017-03-07 DIAGNOSIS — Z5112 Encounter for antineoplastic immunotherapy: Secondary | ICD-10-CM | POA: Diagnosis present

## 2017-03-07 DIAGNOSIS — C770 Secondary and unspecified malignant neoplasm of lymph nodes of head, face and neck: Secondary | ICD-10-CM

## 2017-03-07 DIAGNOSIS — C792 Secondary malignant neoplasm of skin: Secondary | ICD-10-CM

## 2017-03-07 DIAGNOSIS — C3491 Malignant neoplasm of unspecified part of right bronchus or lung: Secondary | ICD-10-CM

## 2017-03-07 DIAGNOSIS — Z79899 Other long term (current) drug therapy: Secondary | ICD-10-CM

## 2017-03-07 LAB — CBC & DIFF AND RETIC
BASO%: 0.2 % (ref 0.0–2.0)
Basophils Absolute: 0 10*3/uL (ref 0.0–0.1)
EOS%: 5.8 % (ref 0.0–7.0)
Eosinophils Absolute: 0.3 10*3/uL (ref 0.0–0.5)
HCT: 28.7 % — ABNORMAL LOW (ref 34.8–46.6)
HGB: 9.4 g/dL — ABNORMAL LOW (ref 11.6–15.9)
Immature Retic Fract: 8 % (ref 1.60–10.00)
LYMPH#: 1.1 10*3/uL (ref 0.9–3.3)
LYMPH%: 20.6 % (ref 14.0–49.7)
MCH: 29.3 pg (ref 25.1–34.0)
MCHC: 32.8 g/dL (ref 31.5–36.0)
MCV: 89.4 fL (ref 79.5–101.0)
MONO#: 0.2 10*3/uL (ref 0.1–0.9)
MONO%: 4.1 % (ref 0.0–14.0)
NEUT%: 69.3 % (ref 38.4–76.8)
NEUTROS ABS: 3.7 10*3/uL (ref 1.5–6.5)
PLATELETS: 277 10*3/uL (ref 145–400)
RBC: 3.21 10*6/uL — AB (ref 3.70–5.45)
RDW: 12.9 % (ref 11.2–14.5)
Retic %: 1.93 % (ref 0.70–2.10)
Retic Ct Abs: 61.95 10*3/uL (ref 33.70–90.70)
WBC: 5.4 10*3/uL (ref 3.9–10.3)

## 2017-03-07 LAB — COMPREHENSIVE METABOLIC PANEL
ALT: 7 U/L (ref 0–55)
ANION GAP: 8 meq/L (ref 3–11)
AST: 12 U/L (ref 5–34)
Albumin: 3 g/dL — ABNORMAL LOW (ref 3.5–5.0)
Alkaline Phosphatase: 84 U/L (ref 40–150)
BUN: 10.4 mg/dL (ref 7.0–26.0)
CHLORIDE: 102 meq/L (ref 98–109)
CO2: 26 meq/L (ref 22–29)
Calcium: 9 mg/dL (ref 8.4–10.4)
Creatinine: 0.8 mg/dL (ref 0.6–1.1)
GLUCOSE: 272 mg/dL — AB (ref 70–140)
POTASSIUM: 3.7 meq/L (ref 3.5–5.1)
SODIUM: 136 meq/L (ref 136–145)
Total Bilirubin: <0.22 mg/dL (ref 0.20–1.20)
Total Protein: 7.5 g/dL (ref 6.4–8.3)

## 2017-03-07 LAB — TSH: TSH: 6.468 m[IU]/L — AB (ref 0.308–3.960)

## 2017-03-07 MED ORDER — SODIUM CHLORIDE 0.9% FLUSH
10.0000 mL | INTRAVENOUS | Status: DC | PRN
  Administered 2017-03-07: 10 mL
  Filled 2017-03-07: qty 10

## 2017-03-07 MED ORDER — SODIUM CHLORIDE 0.9 % IV SOLN
240.0000 mg | Freq: Once | INTRAVENOUS | Status: AC
  Administered 2017-03-07: 240 mg via INTRAVENOUS
  Filled 2017-03-07: qty 24

## 2017-03-07 MED ORDER — HEPARIN SOD (PORK) LOCK FLUSH 100 UNIT/ML IV SOLN
500.0000 [IU] | Freq: Once | INTRAVENOUS | Status: AC | PRN
  Administered 2017-03-07: 500 [IU]
  Filled 2017-03-07: qty 5

## 2017-03-07 MED ORDER — SODIUM CHLORIDE 0.9 % IV SOLN
Freq: Once | INTRAVENOUS | Status: AC
  Administered 2017-03-07: 11:00:00 via INTRAVENOUS

## 2017-03-07 NOTE — Patient Instructions (Signed)
[place] Discharge Instructions for Patients Receiving Chemotherapy  Today you received the following chemotherapy agents:  Opdivo (nivolumab) To help prevent nausea and vomiting after your treatment, we encourage you to take your nausea medication as prescribed.  If you develop nausea and vomiting that is not controlled by your nausea medication, call the clinic.   BELOW ARE SYMPTOMS THAT SHOULD BE REPORTED IMMEDIATELY:  *FEVER GREATER THAN 100.5 F  *CHILLS WITH OR WITHOUT FEVER  NAUSEA AND VOMITING THAT IS NOT CONTROLLED WITH YOUR NAUSEA MEDICATION  *UNUSUAL SHORTNESS OF BREATH  *UNUSUAL BRUISING OR BLEEDING  TENDERNESS IN MOUTH AND THROAT WITH OR WITHOUT PRESENCE OF ULCERS  *URINARY PROBLEMS  *BOWEL PROBLEMS  UNUSUAL RASH Items with * indicate a potential emergency and should be followed up as soon as possible.  Feel free to call the clinic should you have any questions or concerns. The clinic phone number is [phone].  Please show the [place] at check-in to the Emergency Department and triage nurse.

## 2017-03-08 LAB — T4, FREE: T4,Free(Direct): 1.08 ng/dL (ref 0.82–1.77)

## 2017-03-14 IMAGING — CT CT ANGIO CHEST
2 of 6 series · 16 of 36 positions shown · IV contrast (ISOVUE 370)
Comparison: Current chest radiograph. Chest CT, 12/26/2015. PET-CT,
01/12/2016.

CLINICAL DATA: 50-year-old female who presents with chest pain and
shortness of breath. She has a history of stage III NSCLC s/p
radiation and chemotherapy (04/01/2016), currently under observation
with plans for restaging. History of chronic tachycardia, for which
she takes Toprol XL 25 mg. States onset of chest pressure and
shortness of breath starting yesterday while at rest. Symptoms seem
to be worsened with ambulation and exertion. Symptoms not change
with movement or palpation. Has had cough but no known fevers or
chills. No leg swelling but does note myalgias and lower legs.
Symptoms did not worsen lying back or sitting forward. Pain constant
since onset. No alleviating factors.

EXAM:
CT ANGIOGRAPHY CHEST WITH CONTRAST
TECHNIQUE: Multidetector CT imaging of the chest was performed using the
standard protocol during bolus administration of intravenous
contrast. Multiplanar CT image reconstructions and MIPs were
obtained to evaluate the vascular anatomy.
CONTRAST:  100 mL of Isovue 370 intravenous contrast

[Series 6: thins for pacs · axial · 0.70mm/px · z∈[-278,-46]mm · 15 of 258 slices shown]
[im 13/258  lung]
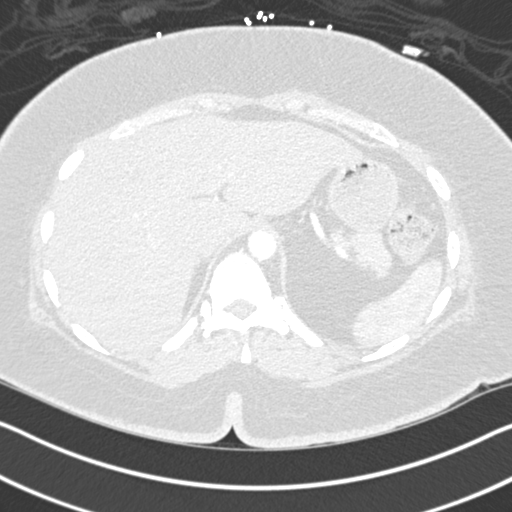
[im 26/258  mediastinal]
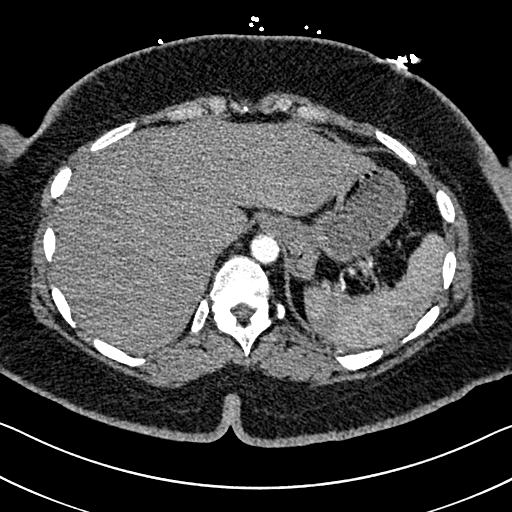
[im 52/258  lung]
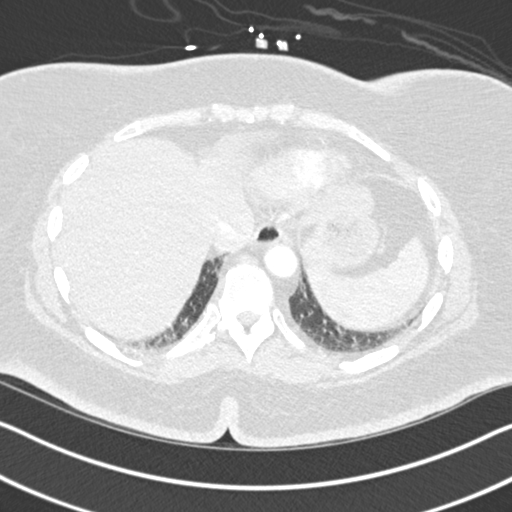
[im 65/258  mediastinal]
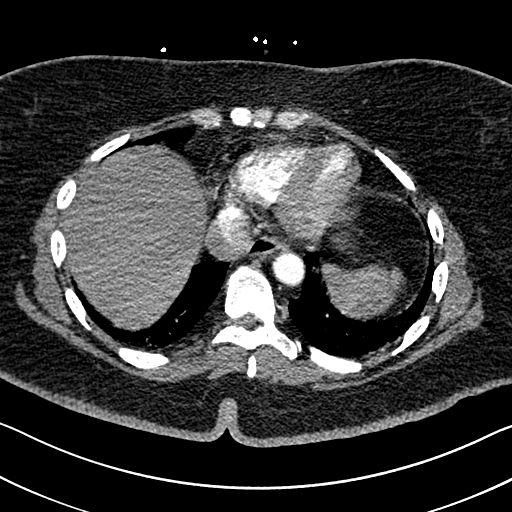
[im 78/258  lung]
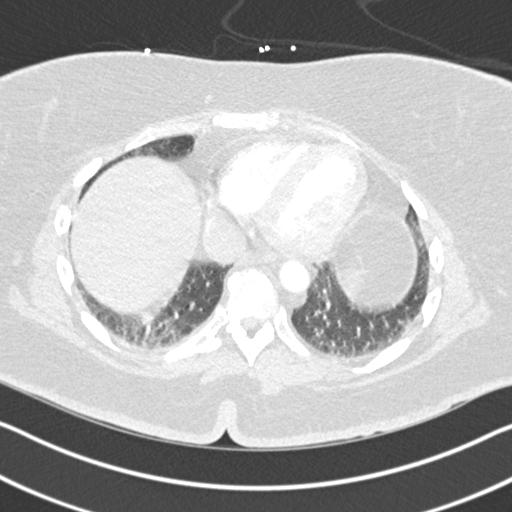
[im 90/258  mediastinal]
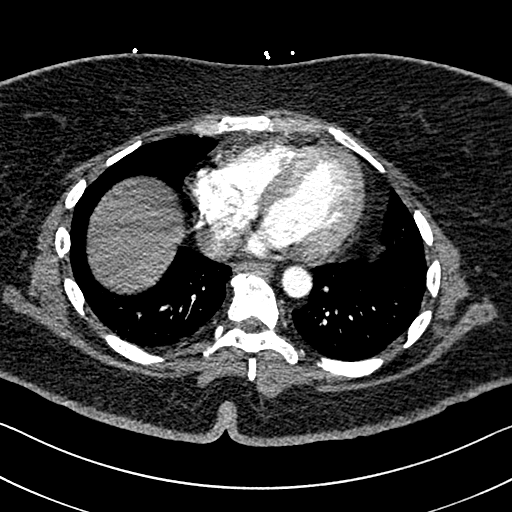
[im 116/258  lung]
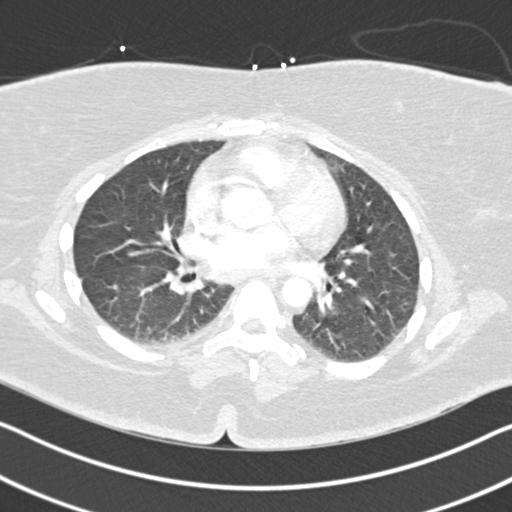
[im 129/258  mediastinal]
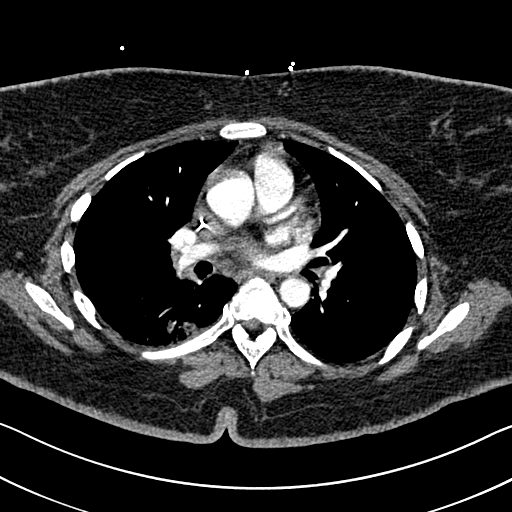
[im 142/258  lung]
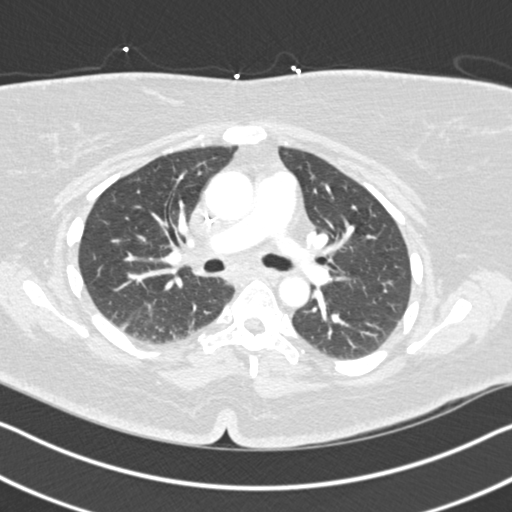
[im 168/258  mediastinal]
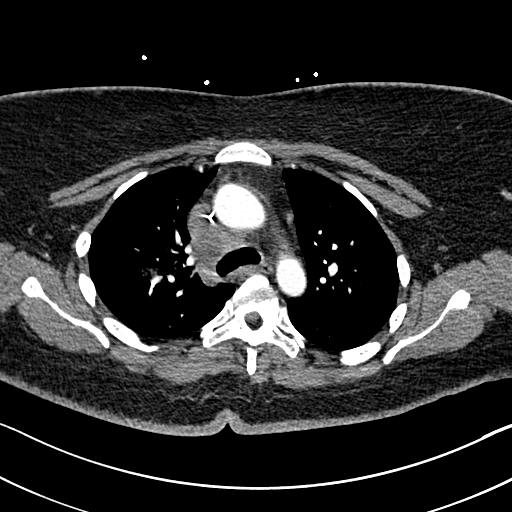
[im 180/258  lung]
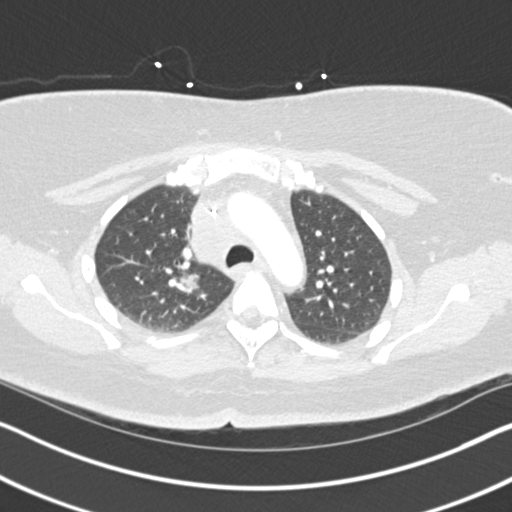
[im 193/258  mediastinal]
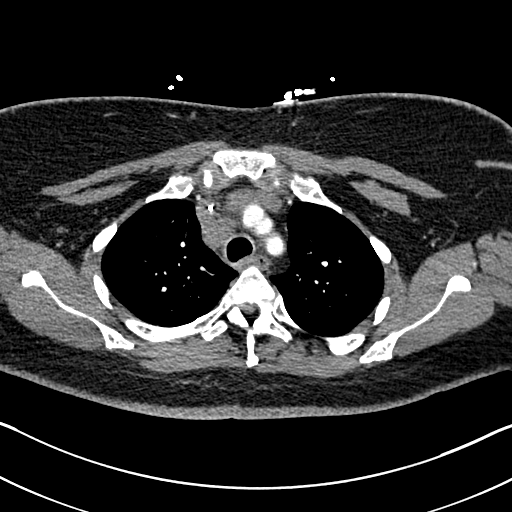
[im 206/258  lung]
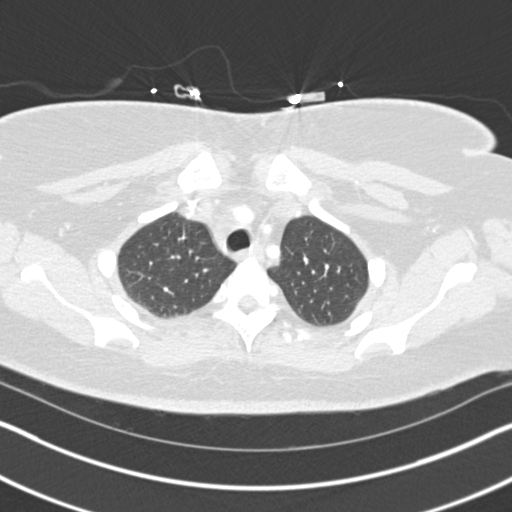
[im 232/258  mediastinal]
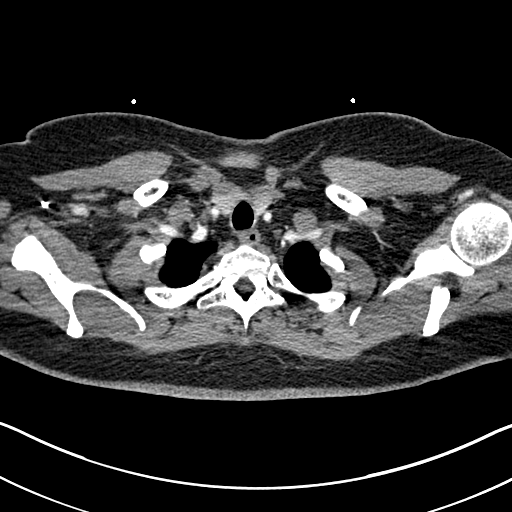
[im 245/258  lung]
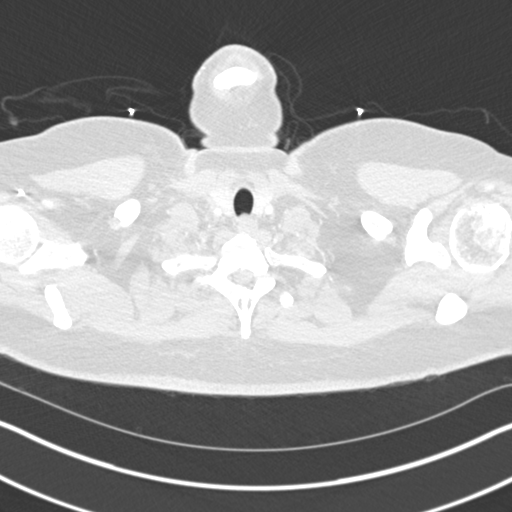

[Series 8: coronal mpr · coronal · 0.50mm/px · 1 of 151 slices shown]
[im 76/151  mediastinal]
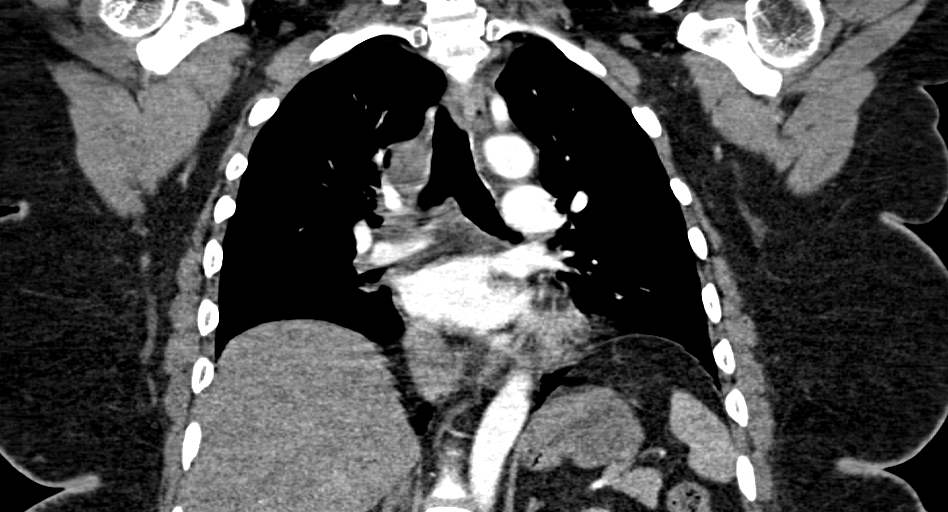

[16 of 36 positions shown; findings below may reference images not displayed]

FINDINGS: Cardiovascular: There is satisfactory opacification the pulmonary
arteries. Some mild motion artifact limits assessment of the lower
segmental and subsegmental pulmonary vessels. Allowing for this mild
limitation, there is no evidence of a pulmonary embolus.

The heart is normal in size and configuration. No coronary artery
calcifications. The great vessels are normal in caliber. No aortic
dissection or plaque. Branch vessels are widely patent.

Mediastinum/Nodes: Soft tissue mass consistent with the patient's
known carcinoma extends from the right superior hilum along the
right peritracheal superior mediastinum, abutting the posterior
aspect of the superior vena cava and contacting the ascending aorta.
It has decreased in size when compared to the prior chest CT,
currently measuring 6.8 x 3.7 x 2.2 cm, previously approximately
x 3.9 x 5.7 cm. No other mediastinal masses. No hilar masses or
enlarged lymph nodes.

Supraclavicular adenopathy noted on the prior PET-CT has
significantly improved. There are no enlarged supraclavicular or
axillary lymph nodes.

Lungs/Pleura: There is a new focal area of ground-glass opacity in
the right upper lobe, centered on image 30, series 7, measuring 16 x
11 cm transversely. In the superior segment of the right upper lobe,
along the posterior margin, there is another focal area of airspace
opacity, increased when compared to the prior CT.

Minor subsegmental atelectasis is noted in the posterior lower
lobes. The remainder of the lungs is clear. No pleural effusion. No
pneumothorax.

Upper Abdomen: No liver or visible adrenal masses. No acute
findings.

Musculoskeletal: No acute finding. No osteoblastic or osteolytic
lesions.

Review of the MIP images confirms the above findings.
IMPRESSION: 1. No evidence of a pulmonary embolism.
2. New, 16 mm, focal area of ground-glass opacity in the right upper
lobe. This may reflect metastatic disease. It may be
inflammatory/infectious in origin.
3. There is a less well-defined but otherwise similar appearing area
of opacity in the superior segment right lower lobe, which has
increased from the prior chest CT. This also may reflect metastatic
disease or be infectious or inflammatory in etiology.
4. There has been interval improvement in the lung carcinoma. The
bulky right paratracheal mass has decreased in size. The
supraclavicular adenopathy has significantly decreased with no
residual enlarged lymph nodes.

## 2017-03-21 ENCOUNTER — Encounter: Payer: Self-pay | Admitting: Hematology

## 2017-03-21 ENCOUNTER — Ambulatory Visit: Payer: Medicaid Other

## 2017-03-21 ENCOUNTER — Ambulatory Visit (HOSPITAL_BASED_OUTPATIENT_CLINIC_OR_DEPARTMENT_OTHER): Payer: Medicaid Other

## 2017-03-21 ENCOUNTER — Other Ambulatory Visit: Payer: Medicaid Other

## 2017-03-21 ENCOUNTER — Ambulatory Visit (HOSPITAL_BASED_OUTPATIENT_CLINIC_OR_DEPARTMENT_OTHER): Payer: Medicaid Other | Admitting: Hematology

## 2017-03-21 ENCOUNTER — Other Ambulatory Visit (HOSPITAL_BASED_OUTPATIENT_CLINIC_OR_DEPARTMENT_OTHER): Payer: Medicaid Other

## 2017-03-21 VITALS — BP 118/81 | HR 116 | Temp 98.1°F | Resp 24 | Ht 69.5 in | Wt 268.0 lb

## 2017-03-21 DIAGNOSIS — Z5112 Encounter for antineoplastic immunotherapy: Secondary | ICD-10-CM | POA: Diagnosis present

## 2017-03-21 DIAGNOSIS — E1165 Type 2 diabetes mellitus with hyperglycemia: Secondary | ICD-10-CM

## 2017-03-21 DIAGNOSIS — C792 Secondary malignant neoplasm of skin: Secondary | ICD-10-CM | POA: Diagnosis not present

## 2017-03-21 DIAGNOSIS — C3491 Malignant neoplasm of unspecified part of right bronchus or lung: Secondary | ICD-10-CM

## 2017-03-21 DIAGNOSIS — B3749 Other urogenital candidiasis: Secondary | ICD-10-CM

## 2017-03-21 DIAGNOSIS — I10 Essential (primary) hypertension: Secondary | ICD-10-CM | POA: Diagnosis not present

## 2017-03-21 DIAGNOSIS — C770 Secondary and unspecified malignant neoplasm of lymph nodes of head, face and neck: Secondary | ICD-10-CM

## 2017-03-21 DIAGNOSIS — Z452 Encounter for adjustment and management of vascular access device: Secondary | ICD-10-CM

## 2017-03-21 DIAGNOSIS — Z794 Long term (current) use of insulin: Secondary | ICD-10-CM | POA: Diagnosis not present

## 2017-03-21 DIAGNOSIS — Z95828 Presence of other vascular implants and grafts: Secondary | ICD-10-CM

## 2017-03-21 DIAGNOSIS — R0609 Other forms of dyspnea: Secondary | ICD-10-CM | POA: Diagnosis not present

## 2017-03-21 DIAGNOSIS — R1900 Intra-abdominal and pelvic swelling, mass and lump, unspecified site: Secondary | ICD-10-CM

## 2017-03-21 DIAGNOSIS — L299 Pruritus, unspecified: Secondary | ICD-10-CM | POA: Diagnosis not present

## 2017-03-21 DIAGNOSIS — E279 Disorder of adrenal gland, unspecified: Secondary | ICD-10-CM | POA: Diagnosis not present

## 2017-03-21 DIAGNOSIS — R05 Cough: Secondary | ICD-10-CM

## 2017-03-21 DIAGNOSIS — E039 Hypothyroidism, unspecified: Secondary | ICD-10-CM

## 2017-03-21 LAB — CBC & DIFF AND RETIC
BASO%: 0.2 % (ref 0.0–2.0)
BASOS ABS: 0 10*3/uL (ref 0.0–0.1)
EOS ABS: 0.4 10*3/uL (ref 0.0–0.5)
EOS%: 6.9 % (ref 0.0–7.0)
HEMATOCRIT: 29.8 % — AB (ref 34.8–46.6)
HEMOGLOBIN: 9.6 g/dL — AB (ref 11.6–15.9)
IMMATURE RETIC FRACT: 6.3 % (ref 1.60–10.00)
LYMPH%: 22.2 % (ref 14.0–49.7)
MCH: 28.8 pg (ref 25.1–34.0)
MCHC: 32.2 g/dL (ref 31.5–36.0)
MCV: 89.5 fL (ref 79.5–101.0)
MONO#: 0.4 10*3/uL (ref 0.1–0.9)
MONO%: 6.5 % (ref 0.0–14.0)
NEUT#: 3.4 10*3/uL (ref 1.5–6.5)
NEUT%: 64.2 % (ref 38.4–76.8)
Platelets: 236 10*3/uL (ref 145–400)
RBC: 3.33 10*6/uL — ABNORMAL LOW (ref 3.70–5.45)
RDW: 13 % (ref 11.2–14.5)
Retic %: 1.63 % (ref 0.70–2.10)
Retic Ct Abs: 54.28 10*3/uL (ref 33.70–90.70)
WBC: 5.4 10*3/uL (ref 3.9–10.3)
lymph#: 1.2 10*3/uL (ref 0.9–3.3)

## 2017-03-21 LAB — COMPREHENSIVE METABOLIC PANEL
ALT: 9 U/L (ref 0–55)
AST: 12 U/L (ref 5–34)
Albumin: 3.1 g/dL — ABNORMAL LOW (ref 3.5–5.0)
Alkaline Phosphatase: 93 U/L (ref 40–150)
Anion Gap: 8 mEq/L (ref 3–11)
BUN: 9.4 mg/dL (ref 7.0–26.0)
CALCIUM: 8.9 mg/dL (ref 8.4–10.4)
CHLORIDE: 105 meq/L (ref 98–109)
CO2: 27 meq/L (ref 22–29)
CREATININE: 0.7 mg/dL (ref 0.6–1.1)
EGFR: >60 mL/min/{1.73_m2} (ref >60)
GLUCOSE: 159 mg/dL — AB (ref 70–140)
Potassium: 3.7 mEq/L (ref 3.5–5.1)
SODIUM: 139 meq/L (ref 136–145)
Total Bilirubin: 0.35 mg/dL (ref 0.20–1.20)
Total Protein: 7.5 g/dL (ref 6.4–8.3)

## 2017-03-21 MED ORDER — SODIUM CHLORIDE 0.9 % IJ SOLN
10.0000 mL | INTRAMUSCULAR | Status: DC | PRN
  Administered 2017-03-21: 10 mL via INTRAVENOUS
  Filled 2017-03-21: qty 10

## 2017-03-21 MED ORDER — FLUCONAZOLE 200 MG PO TABS: 200.0000 mg | ORAL_TABLET | Freq: Once | ORAL | 0 refills | Status: AC

## 2017-03-21 MED ORDER — SODIUM CHLORIDE 0.9 % IV SOLN
Freq: Once | INTRAVENOUS | Status: AC
  Administered 2017-03-21: 16:00:00 via INTRAVENOUS

## 2017-03-21 MED ORDER — SODIUM CHLORIDE 0.9% FLUSH
10.0000 mL | INTRAVENOUS | Status: DC | PRN
  Administered 2017-03-21: 10 mL
  Filled 2017-03-21: qty 10

## 2017-03-21 MED ORDER — LEVOTHYROXINE SODIUM 125 MCG PO TABS: 125.0000 ug | ORAL_TABLET | Freq: Every day | ORAL | 2 refills | Status: DC

## 2017-03-21 MED ORDER — MICONAZOLE NITRATE 2 % EX POWD: Freq: Two times a day (BID) | CUTANEOUS | 1 refills | Status: DC

## 2017-03-21 MED ORDER — SODIUM CHLORIDE 0.9 % IV SOLN
240.0000 mg | Freq: Once | INTRAVENOUS | Status: AC
  Administered 2017-03-21: 240 mg via INTRAVENOUS
  Filled 2017-03-21: qty 24

## 2017-03-21 MED ORDER — HEPARIN SOD (PORK) LOCK FLUSH 100 UNIT/ML IV SOLN
500.0000 [IU] | Freq: Once | INTRAVENOUS | Status: AC | PRN
  Administered 2017-03-21: 500 [IU]
  Filled 2017-03-21: qty 5

## 2017-03-21 NOTE — Patient Instructions (Signed)
[facility] Discharge Instructions for Patients Receiving Chemotherapy  Today you received the following chemotherapy agents Opdivo  To help prevent nausea and vomiting after your treatment, we encourage you to take your nausea medication as directed   If you develop nausea and vomiting that is not controlled by your nausea medication, call the clinic.   BELOW ARE SYMPTOMS THAT SHOULD BE REPORTED IMMEDIATELY:  *FEVER GREATER THAN 100.5 F  *CHILLS WITH OR WITHOUT FEVER  NAUSEA AND VOMITING THAT IS NOT CONTROLLED WITH YOUR NAUSEA MEDICATION  *UNUSUAL SHORTNESS OF BREATH  *UNUSUAL BRUISING OR BLEEDING  TENDERNESS IN MOUTH AND THROAT WITH OR WITHOUT PRESENCE OF ULCERS  *URINARY PROBLEMS  *BOWEL PROBLEMS  UNUSUAL RASH Items with * indicate a potential emergency and should be followed up as soon as possible.  Feel free to call the clinic should you have any questions or concerns. The clinic phone number is [phone].  Please show the CHEMO ALERT CARD at check-in to the Emergency Department and triage nurse.   

## 2017-03-21 NOTE — Progress Notes (Signed)
[name]    HEMATOLOGY/ONCOLOGY CLINIC NOTE  Date of Service: 03/21/2017   Patient Care Team: [name], FNP as PCP - General (Nurse Practitioner)  CHIEF COMPLAINTS/PURPOSE OF CONSULTATION:  f/u Lung Adenocarcinoma.  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  Current treatment:  Nivolumab q2 weeks  PreviousTreatment -Concurrent Chemo-radiation with Carboplatin/Taxol. Completed RT on 02/13/2016 and has then completed 2 cycles of carboplatin + taxol. -Started Maintainence Durvalumab from  05/24/2016 q2weeks, switched to Nivolumab q2weeks on 12/13/16 once metastatic disease noted -s/p palliative RT to rt sided Subcutaneous metastatic mass.    HISTORY OF PRESENTING ILLNESS: Plz see my previous note for details on initial presentation.  INTERVAL HISTORY   Pt presents to the office today for followup of her metastatic lung cancer. She is receiving her 7th cycle of Nivolumab today. She reports that she is doing well overall and her energy level is still well. She is still taking levothyroxine regularly and reports no acute new concerns. Her TSH has now normalized. She states that the right flank soft tissue mass has continued to shrink despite some tingling sensation in the area, but otherwise is non-painful. She also notes some exertional dyspnea and some coughing. Her TSH has normalized since we last saw her. She also reports that she has started with physical therapy and this has been helping. She also states that she does have an inguinal rash which is new and pruritic. She continues to have neuropathy in her feet, but she states that this has been improving. She has been walking up her street regularly and has not been limited in her activity.   Of note, she does currently need a refill of her levothyroxine.   On review of systems, pt denies fever, chills, rash, mouth sores, weight loss, decreased appetite, urinary complaints. Denies pain. Pt  denies abdominal pain, nausea, vomiting. Pertinent positives are listed within the above HPI.   MEDICAL HISTORY:  Past Medical History:  Diagnosis Date  . Asthma   . Diabetes mellitus   . Hypercholesteremia   . Hypertension   . Lung cancer ([place])   . Neuropathy   . Obese     SURGICAL HISTORY: Past Surgical History:  Procedure Laterality Date  . CESAREAN SECTION    . IR FLUORO GUIDE PORT INSERTION RIGHT  02/18/2017  . IR GENERIC HISTORICAL  01/08/2016   IR US GUIDE VASC ACCESS RIGHT 01/08/2016 [name], DO [facility]INTER[facility]  . IR GENERIC HISTORICAL  01/08/2016   IR FLUORO GUIDE CV LINE RIGHT 01/08/2016 [name], DO [facility]  . IR US GUIDE VASC ACCESS RIGHT  02/18/2017  . TUBAL LIGATION    . US guided core needle biopsy     of right lower neck/supraclavicular lymph nodes    SOCIAL HISTORY: Social History   Socioeconomic History  . Marital status: Divorced    Spouse name: Not on file  . Number of children: Not on file  . Years of education: Not on file  . Highest education level: Not on file  Social Needs  . Financial resource strain: Not on file  . Food insecurity - worry: Not on file  . Food insecurity - inability: Not on file  . Transportation needs - medical: Not on file  . Transportation needs - non-medical: Not on file  Occupational History  . Not on file  Tobacco Use  . Smoking status: Never Smoker  . Smokeless tobacco: Never Used  Substance and Sexual Activity  . Alcohol use:  No  . Drug use: No  . Sexual activity: Not Currently  Other Topics Concern  . Not on file  Social History Narrative   No bird or mold exposure. No recent travel.    FAMILY HISTORY: Family History  Problem Relation Age of Onset  . Heart failure Mother   . Hypertension Mother   . Cancer Neg Hx   . Rheumatologic disease Neg Hx     ALLERGIES:  has No Known Allergies.  MEDICATIONS:  Current Outpatient Medications  Medication Sig Dispense Refill  . aspirin EC 81 MG  tablet Take 1 tablet (81 mg total) by mouth daily. 60 tablet 2  . benzonatate (TESSALON) 100 MG capsule Take 1 capsule (100 mg total) by mouth 3 (three) times daily as needed for cough. 60 capsule 0  . carvedilol (COREG) 6.25 MG tablet Take 1 tablet (6.25 mg total) by mouth 2 (two) times daily. 180 tablet 3  . DULoxetine (CYMBALTA) 60 MG capsule Take 1 capsule (60 mg total) by mouth daily. 30 capsule 2  . insulin aspart (NOVOLOG) 100 UNIT/ML FlexPen As per sliding scale instructions (Patient taking differently: Inject 0-20 Units into the skin 3 (three) times daily as needed for high blood sugar. As per sliding scale instructions) 15 mL 2  . Insulin Degludec (TRESIBA FLEXTOUCH) 200 UNIT/ML SOPN Inject 100 Units into the skin daily before breakfast.     . levothyroxine (SYNTHROID, LEVOTHROID) 125 MCG tablet Take 1 tablet (125 mcg total) by mouth daily before breakfast. 30 tablet 2  . lidocaine-prilocaine (EMLA) cream Apply 1 application as needed topically. 30 g 6  . loratadine (CLARITIN) 10 MG tablet Take 10 mg by mouth daily.    [name] losartan (COZAAR) 25 MG tablet Take 1 tablet (25 mg total) by mouth daily. 90 tablet 3  . omeprazole (PRILOSEC) 20 MG capsule Take 1 capsule (20 mg total) by mouth daily. 30 capsule 0  . oxyCODONE-acetaminophen (PERCOCET/ROXICET) 5-325 MG tablet Take 1-2 tablets by mouth every 6 (six) hours as needed for moderate pain or severe pain. 40 tablet 0  . polyethylene glycol (MIRALAX) packet Take 17 g by mouth daily. 30 each 1  . senna-docusate (SENNA S) 8.6-50 MG tablet Take 2 tablets by mouth 2 (two) times daily. May reduce to 2 tab po HS as needed once regular BM estabished 60 tablet 1   No current facility-administered medications for this visit.    Facility-Administered Medications Ordered in Other Visits  Medication Dose Route Frequency Provider Last Rate Last Dose  . sodium chloride 0.9 % injection 10 mL  10 mL Intravenous PRN [name], MD   10 mL at 05/24/16  1252    REVIEW OF SYSTEMS:    10 Point review of Systems was done is negative except as noted above.  PHYSICAL EXAMINATION: ECOG PERFORMANCE STATUS: 1 - Symptomatic but completely ambulatory  . Vitals:   03/21/17 1512  BP: 118/81  Pulse: (!) 116  Resp: (!) 24  Temp: 98.1 F (36.7 C)  SpO2: 100%   Filed Weights   03/21/17 1512  Weight: 268 lb (121.6 kg)   .Body mass index is 39.01 kg/m.  GENERAL:alert, in no acute distress and comfortable SKIN: radiation related skin injury over neck - healing EYES: normal, conjunctiva are pink and non-injected, sclera clear OROPHARYNX:oral thrush noted NECK: supple, no JVD, thyroid normal size, non-tender, without nodularity LYMPH:  no palpable lymphadenopathy in the cervical, axillary or inguinal.  LUNGS: clear to auscultation with normal respiratory effort. Distant breath sounds  HEART: regular rate & rhythm,  no murmurs and no lower extremity edema ABDOMEN: abdomen obese, soft, non-tender, normoactive bowel sounds , rt flank subcutaneous mass palpable. Musculoskeletal: no cyanosis of digits and no clubbing  PSYCH: alert & oriented x 3 with fluent speech NEURO: no focal motor/sensory deficits  LABORATORY DATA: .  [name] CBC Latest Ref Rng & Units 03/21/2017 03/07/2017 02/21/2017  WBC 3.9 - 10.3 10e3/uL 5.4 5.4 5.4  Hemoglobin 11.6 - 15.9 g/dL 9.6(L) 9.4(L) 9.2(L)  Hematocrit 34.8 - 46.6 % 29.8(L) 28.7(L) 28.7(L)  Platelets 145 - 400 10e3/uL 236 277 228   . CMP Latest Ref Rng & Units 03/21/2017 03/07/2017 02/21/2017  Glucose 70 - 140 mg/dl 159(H) 272(H) 230(H)  BUN 7.0 - 26.0 mg/dL 9.4 10.4 5.0(L)  Creatinine 0.6 - 1.1 mg/dL 0.7 0.8 0.8  Sodium 136 - 145 mEq/L 139 136 139  Potassium 3.5 - 5.1 mEq/L 3.7 3.7 3.8  Chloride 101 - 111 mmol/L - - -  CO2 22 - 29 mEq/L '27 26 28  ' Calcium 8.4 - 10.4 mg/dL 8.9 9.0 8.7  Total Protein 6.4 - 8.3 g/dL 7.5 7.5 7.3  Total Bilirubin 0.20 - 1.20 mg/dL 0.35 <0.22 0.25  Alkaline Phos 40  - 150 U/L 93 84 74  AST 5 - 34 U/L '12 12 10  ' ALT 0 - 55 U/L '9 7 8   ' Component     Latest Ref Rng & Units 03/07/2017  TSH     0.308 - 3.960 m(IU)/L 6.468 (H)  T4,Free(Direct)     0.82 - 1.77 ng/dL 1.08               RADIOGRAPHIC STUDIES:  .No results found.   ASSESSMENT & PLAN:   51 year old African-American female with history of hypertension, diabetes, asthma, dysuria mass and morbid obesity with    #1 h/o Lung Adenocarcinoma Rt sided atleast Stage IIIB (on diagnosis) with large right paratracheal mass with mediastinal adenopathy that appears to have grown significantly over the last 6-7 months and rt supraclavicular LN +.  -Noted to have a small mass in the left adrenal on CT but PET/CT neg for metastatic disease. Patient has been a lifelong nonsmoker -MRI of the brain was negative for any metastatic disease. At diagnosis. -High PDL1 expression (90%) on foundation One Neg for EGFR, ALK, ROS-1 and BRAF mutations. -Patient completed her planned definitive chemoradiation with carbo Taxol on 02/13/2016. No prohibitive toxicities other than some grade 1 skin desquamation and some grade 1-2 radiation esophagitis. She has subsequently received 2 out of 2 planned dose of carboplatin + Taxol. -Patient has completed 11 doses of maintenance   -CTA chest 10/29/2016  no evidence of lung cancer progression in the chest. No PE -CT abd/pelvis-10/29/2016  Interval 5.0 x 5.0 x 4.7 cm mass in the right lateral subcutaneous fat at the level of the mid abdomen. This has CT features compatible with a metastasis or primary neoplasm.  #2 Now with Metastatic lung adenocarcinoma with isolated metastases in the right lateral subcutaneous fat at the level of the midabdomen. she received maintainence Durvalumab from  05/24/2016 q2weeks, switched to Nivolumab q2weeks on 12/13/16 in the setting of Biopsy proven isolated metastatic disease. Given the tumor is strongly PDL1 positive and patient has only  isolated metastatic disease was treated with palliative RT to metastases with significant improvement. Plan -Patient reports no symptoms suggestive of disease progression at this time. -No prohibitive toxicities from  Nivolumab. -lab stable -continue Nivolumab at this time in the absence of overt disease progression or prohibitive toxicities. - Continue when necessary Percocet for pain management  -will need rpt imaging every 4 months. Would plan in Jan 2019  #3 h/o radiation pneumonitis - no new symptoms. Does has some cough with exertion but this has improved.  #4 Improving fatigue (grade 1)  -working with outpatient PT program - optimize DM2 management with PCP - improved fatigue with levothyroxine compliance with near normalization of FT4 levels.  Has been off her thyroid replacement for 1 week and has now restarted. - Continue Levolthyroxine 173mg ---will likely need larger dose but in the setting of baseline tachycardia we are gradually increasing dose to avoid ppt cardiac arrhythmias.  FT4 today 1.08  #5 normocytic anemia related to chemotherapy -resolving and stable Hg at 9.5  #6 Left chest wall pain due to costochondritis - mx with pain medications. CTA x 2 neg for PE. No significant pain at this time.  #7 Sinus tachycardia - on BB per cards , no PE on CTA x 2  -Still having sinus tachycardia . Partly from deconditioning and anemia. Seeing Dr. HDebara Pickettfrom cardiology  -Continues to be on Coreg  #8 neoplasm related pain is currently controlled without significant medications. Clinically likely has sleep apnea . Would need to be careful with sedative medications . 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 We will give her topical and oral  antifungals to help with this.    Continue Nivolumab q2weeks Labs q2weeks RTC with Dr KaIrene Limbon 4weeks with labs   All of the patients questions were answered with apparent satisfaction. The patient knows to call the clinic with any problems, questions or concerns.  I spent 20 minutes counseling the patient face to face. The total time spent in the appointment was 25 minutes and more than 50% was on counseling and direct patient cares.    GaSullivan LoneD MSLa JaraAHIVMS [address] [facility] Center LLCTColumbia Eye And Specialty Surgery Center Ltdematology/Oncology Physician CoWilhoit(Office):       [phone] cell):  [phone]):           [phone] This document serves as a record of services personally performed by GaSullivan LoneMD. It was created on his behalf by WiReola Moshera trained medical scribe. The creation of this record is based on the scribe's personal observations and the provider's statements to them.   .I have reviewed the above documentation for accuracy and completeness, and I agree with the above. .GBrunetta GeneraD MS

## 2017-03-22 ENCOUNTER — Telehealth: Payer: Self-pay

## 2017-03-22 ENCOUNTER — Other Ambulatory Visit: Payer: Self-pay

## 2017-03-22 DIAGNOSIS — Z23 Encounter for immunization: Secondary | ICD-10-CM

## 2017-03-22 NOTE — Telephone Encounter (Signed)
Spoke with Arbie Cookey, [facility] 03/21/17. Pt due for pneumovax 23 at this time. Per Dr. Irene Limbo, okay to order for pt visit on 04/04/17. Order placed in signed and held for RN to release on day of treatment. Additional note attached to infusion appointment.

## 2017-03-23 ENCOUNTER — Telehealth: Payer: Self-pay | Admitting: Hematology

## 2017-03-23 NOTE — H&P (Signed)
HISTORY AND PHYSICAL  Marilyn [name] is a 51 y.o. female patient referred by general dentist for multiple extractions.  C/o pain lower right.  No diagnosis found.  Past Medical History:  Diagnosis Date  . Asthma   . Diabetes mellitus   . Hypercholesteremia   . Hypertension   . Lung cancer ([place])   . Neuropathy   . Obese     No current facility-administered medications for this encounter.    Current Outpatient Medications  Medication Sig Dispense Refill  . aspirin EC 81 MG tablet Take 1 tablet (81 mg total) by mouth daily. 60 tablet 2  . carvedilol (COREG) 6.25 MG tablet Take 1 tablet (6.25 mg total) by mouth 2 (two) times daily. (Patient taking differently: Take 6.25 mg by mouth daily. ) 180 tablet 3  . DULoxetine (CYMBALTA) 60 MG capsule Take 1 capsule (60 mg total) by mouth daily. 30 capsule 2  . insulin aspart (NOVOLOG) 100 UNIT/ML FlexPen As per sliding scale instructions (Patient taking differently: Inject 0-10 Units into the skin 3 (three) times daily. As per sliding scale instructions) 15 mL 2  . Insulin Degludec (TRESIBA FLEXTOUCH) 200 UNIT/ML SOPN Inject 100 Units into the skin daily before breakfast.     . levothyroxine (SYNTHROID, LEVOTHROID) 125 MCG tablet Take 1 tablet (125 mcg total) by mouth daily before breakfast. 30 tablet 2  . lidocaine-prilocaine (EMLA) cream Apply 1 application as needed topically. (Patient taking differently: Apply 1 application topically daily as needed (prior to port being accessed--every 2 weeks.). ) 30 g 6  . loratadine (CLARITIN) 10 MG tablet Take 10 mg by mouth daily.    [name] losartan (COZAAR) 25 MG tablet Take 1 tablet (25 mg total) by mouth daily. 90 tablet 3  . omeprazole (PRILOSEC) 20 MG capsule Take 1 capsule (20 mg total) by mouth daily. 30 capsule 0  . oxyCODONE-acetaminophen (PERCOCET/ROXICET) 5-325 MG tablet Take 1-2 tablets by mouth every 6 (six) hours as needed for moderate pain or severe pain. 40 tablet 0  . senna-docusate (SENNA S)  8.6-50 MG tablet Take 2 tablets by mouth 2 (two) times daily. May reduce to 2 tab po HS as needed once regular BM estabished (Patient taking differently: Take 2 tablets by mouth 2 (two) times daily as needed (FOR CONSTIPATION). =) 60 tablet 1  . benzonatate (TESSALON) 100 MG capsule Take 1 capsule (100 mg total) by mouth 3 (three) times daily as needed for cough. (Patient not taking: Reported on 03/22/2017) 60 capsule 0  . miconazole (MICOTIN) 2 % powder Apply topically 2 (two) times daily. Apply to area of rash over the perineum 85 g 1  . polyethylene glycol (MIRALAX) packet Take 17 g by mouth daily. (Patient not taking: Reported on 03/22/2017) 30 each 1   Facility-Administered Medications Ordered in Other Encounters  Medication Dose Route Frequency Provider Last Rate Last Dose  . sodium chloride 0.9 % injection 10 mL  10 mL Intravenous PRN [name], MD   10 mL at 05/24/16 1252   No Known Allergies Active Problems:   * No active hospital problems. *  Vitals: There were no vitals taken for this visit. Lab results:No results found for this or any previous visit (from the past 48 hour(s)). Radiology Results: No results found. General appearance: alert, cooperative and morbidly obese Head: Normocephalic, without obvious abnormality, atraumatic Eyes: negative Nose: Nares normal. Septum midline. Mucosa normal. No drainage or sinus tenderness. Throat: multiple carious teeth. No purulence, edema, or trismus. Pharynx  clear.  Neck: no adenopathy, supple, symmetrical, trachea midline and thyroid not enlarged, symmetric, no tenderness/mass/nodules Resp: clear to auscultation bilaterally Cardio: regular rate and rhythm, S1, S2 normal, no murmur, click, rub or gallop  Assessment: multiple nonrestorable teeth.   Plan: Multiple extractions with alveoloplasty. Ga. Nasal. Day surgery.   [name] 03/23/2017

## 2017-03-23 NOTE — Telephone Encounter (Signed)
Scheduled appt per 12/17 los - spoke with patient regarding appts.

## 2017-03-24 ENCOUNTER — Other Ambulatory Visit: Payer: Self-pay

## 2017-03-24 ENCOUNTER — Encounter (HOSPITAL_COMMUNITY): Payer: Self-pay | Admitting: *Deleted

## 2017-03-24 MED ORDER — DEXTROSE 5 % IV SOLN
3.0000 g | INTRAVENOUS | Status: DC
  Filled 2017-03-24: qty 3000

## 2017-03-24 NOTE — Progress Notes (Addendum)
Marilyn [name] rteports that  CBG has been running > 200 , up to 300 at times.  "They have tried to get me an appointment with a diabetic specialist, but no one take Medicare.  Patient denies chest pain. I instructed patient to check CBG after awaking and every 2 hours until arrival  to the hospital.  I Instructed patient if CBG is less than 70 to drink  1/2 cup of a clear juice. Recheck CBG in 15 minutes then call pre- op desk at [phone] for further instructions. If scheduled to receive Insulin, do not take Insulin  I instructed patient if CBG is > 70 to take 1/2 of Tresiba - 50 units, if CBG > 220 to take 1/2 of SS Insulin.  Marilyn [name] repeated instructions.

## 2017-03-24 NOTE — Progress Notes (Signed)
Anesthesia Chart Review: SAME DAY WORK-UP.  Patient is a 51 year old female scheduled for multiple teeth extractions with alveoloplasty on 03/25/17 by Dr. [name].  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 BMI is consistent with obesity.  - PCP is Selina Cooley, FNP.  - HEM-ONC is with [facility]. Most visit with Dr. Sullivan Lone, but last visit with Dr. Erasmo Downer on 03/21/17. RAD-ONC is Dr. Tyler Pita. Lung cancer treatments have included: - Concurrent Chemo-radiation with Carboplatin/Taxol. Completed RT on 02/13/2016 and has then completed 2 cycles of carboplatin + taxol. -Started Maintainence Durvalumab from  05/24/2016 q2weeks, switched to Nivolumab q2weeks on 12/13/16 once metastatic disease noted -s/p palliative RT to rt sided Subcutaneous metastatic mass.   - Cardiologist is Dr. Lyman Bishop, first seen on 10/04/16 for evaluation of tachycardia. 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 Other possibilities included medication associated cardiomyopathy (on Durvalumab which can be associated with myocarditis, but typically not cardiomyopathy) or medication induced hyperthyroidism (question accuracy of thyroid studies due to no improvement on high doses of levothyroxine). Metoprolol worsened her asthma, so changed to Coreg. ACE inhibitor or ARB recommended as BP allows. Referral to endocrinology recommended (was going to see Dr. Elayne Snare, but reportedly did not take Medicare), but more recently oncology has been managing--hade elevated TSH but normal free T4 03/2017. Last visit 02/04/17 with six month follow-up with echo at that time  recommended.   Meds include ASA 81 mg, Coreg, Cymbalta, Novolog SSI, Tresiba 200 Unit/ml 100 Units Q AM, levothyroxine, Claritin, losartan, Prilosec, Percocet. Last Opdivo injection 03/21/17.  EKG 01/02/17: ST at 113, abnormal R wave progression, early transition, borderline T wave abnormalities.   Nuclear stress 11/05/16:  The left ventricular ejection fraction is mildly decreased (45-54%).  Nuclear stress EF: 52%.  There was no ST segment deviation noted during stress.  Defect 1: There is a medium defect of moderate severity present in the mid anterior and apical anterior location.  This is a low risk study.  Probably normal, low risk stress nuclear study with soft tissue attenuation; no ischemia; EF 52 but visually appears better; normal wall motion.  Echo 11/02/16: Study Conclusions - Left ventricle: The cavity size was normal. Wall thickness was   increased in a pattern of mild LVH. There was mild focal basal   hypertrophy of the septum. Systolic function was mildly reduced.   The estimated ejection fraction was in the range of 45% to 50%.   Diffuse hypokinesis. Doppler parameters are consistent with   restrictive physiology, indicative of decreased left ventricular   diastolic compliance and/or increased left atrial pressure. - Aortic valve: There was mild regurgitation. - Pericardium, extracardiac: A small pericardial effusion was   identified. Impressions: - Mild global reduction in LV systolic function; mild LVH;   restrictive filling; mild AI.  Labs on 03/21/17 noted. Cr 0.7. H/H 9.6/29.8, consistent with previous results. PLT 236K. WBC 5.4. Thyroid studies on 03/07/17 showed a elevated TSH of 6.468 and normal free T4 of 1.08. I do not have a recent A1c. She reported some home CBGs have been running 200-300. 03/21/17 labs showed a random glucose of 159. She is on insulin and was given day of surgery coverage instructions if CBG > 220. She will get a fasting  CBG on arrival.  Defer any additional lab orders to surgeon and/or anesthesiologist.   Patient is a same day work-up, so further evaluation of patient on the day of surgery. If fasting CBG acceptable, vitals stable (history of tachycardia), and no acute cardiopulmonary symptoms then I would anticipate that she can proceed. Known stage IV lung cancer in a non-smoker.  George Hugh [facility] Short Stay Center/Anesthesiology Phone [phone] 03/24/2017 3:32 PM

## 2017-03-25 ENCOUNTER — Ambulatory Visit (HOSPITAL_COMMUNITY): Payer: Medicaid Other | Admitting: Emergency Medicine

## 2017-03-25 ENCOUNTER — Ambulatory Visit (HOSPITAL_COMMUNITY)
Admission: RE | Admit: 2017-03-25 | Discharge: 2017-03-25 | Disposition: A | Discharge status: Home / Self Care | Payer: Medicaid Other | Source: Ambulatory Visit | Attending: Oral Surgery | Admitting: Oral Surgery

## 2017-03-25 ENCOUNTER — Encounter (HOSPITAL_COMMUNITY): Payer: Self-pay

## 2017-03-25 ENCOUNTER — Encounter (HOSPITAL_COMMUNITY)
Admission: RE | Disposition: A | Discharge status: Home / Self Care | Payer: Self-pay | Source: Ambulatory Visit | Attending: Oral Surgery

## 2017-03-25 DIAGNOSIS — M1990 Unspecified osteoarthritis, unspecified site: Secondary | ICD-10-CM | POA: Insufficient documentation

## 2017-03-25 DIAGNOSIS — C3490 Malignant neoplasm of unspecified part of unspecified bronchus or lung: Secondary | ICD-10-CM | POA: Insufficient documentation

## 2017-03-25 DIAGNOSIS — Z794 Long term (current) use of insulin: Secondary | ICD-10-CM | POA: Diagnosis not present

## 2017-03-25 DIAGNOSIS — I429 Cardiomyopathy, unspecified: Secondary | ICD-10-CM | POA: Insufficient documentation

## 2017-03-25 DIAGNOSIS — E669 Obesity, unspecified: Secondary | ICD-10-CM | POA: Insufficient documentation

## 2017-03-25 DIAGNOSIS — Z85118 Personal history of other malignant neoplasm of bronchus and lung: Secondary | ICD-10-CM | POA: Insufficient documentation

## 2017-03-25 DIAGNOSIS — I509 Heart failure, unspecified: Secondary | ICD-10-CM | POA: Diagnosis not present

## 2017-03-25 DIAGNOSIS — Z79899 Other long term (current) drug therapy: Secondary | ICD-10-CM | POA: Insufficient documentation

## 2017-03-25 DIAGNOSIS — Z7982 Long term (current) use of aspirin: Secondary | ICD-10-CM | POA: Diagnosis not present

## 2017-03-25 DIAGNOSIS — Z6839 Body mass index (BMI) 39.0-39.9, adult: Secondary | ICD-10-CM | POA: Diagnosis not present

## 2017-03-25 DIAGNOSIS — J45909 Unspecified asthma, uncomplicated: Secondary | ICD-10-CM | POA: Insufficient documentation

## 2017-03-25 DIAGNOSIS — E1142 Type 2 diabetes mellitus with diabetic polyneuropathy: Secondary | ICD-10-CM | POA: Diagnosis not present

## 2017-03-25 DIAGNOSIS — K029 Dental caries, unspecified: Secondary | ICD-10-CM | POA: Insufficient documentation

## 2017-03-25 DIAGNOSIS — Z923 Personal history of irradiation: Secondary | ICD-10-CM | POA: Diagnosis not present

## 2017-03-25 DIAGNOSIS — C7989 Secondary malignant neoplasm of other specified sites: Secondary | ICD-10-CM | POA: Diagnosis not present

## 2017-03-25 DIAGNOSIS — E1140 Type 2 diabetes mellitus with diabetic neuropathy, unspecified: Secondary | ICD-10-CM | POA: Insufficient documentation

## 2017-03-25 DIAGNOSIS — E7800 Pure hypercholesterolemia, unspecified: Secondary | ICD-10-CM | POA: Diagnosis not present

## 2017-03-25 DIAGNOSIS — I110 Hypertensive heart disease with heart failure: Secondary | ICD-10-CM | POA: Insufficient documentation

## 2017-03-25 DIAGNOSIS — Z9221 Personal history of antineoplastic chemotherapy: Secondary | ICD-10-CM | POA: Insufficient documentation

## 2017-03-25 DIAGNOSIS — R000 Tachycardia, unspecified: Secondary | ICD-10-CM | POA: Insufficient documentation

## 2017-03-25 HISTORY — DX: Hypothyroidism, unspecified: E03.9

## 2017-03-25 HISTORY — PX: MULTIPLE EXTRACTIONS WITH ALVEOLOPLASTY: SHX5342

## 2017-03-25 HISTORY — DX: Pneumonia, unspecified organism: J18.9

## 2017-03-25 HISTORY — DX: Unspecified hemorrhoids: K64.9

## 2017-03-25 HISTORY — DX: Unspecified osteoarthritis, unspecified site: M19.90

## 2017-03-25 LAB — GLUCOSE, CAPILLARY
GLUCOSE-CAPILLARY: 246 mg/dL — AB (ref 65–99)
GLUCOSE-CAPILLARY: 215 mg/dL — AB (ref 65–99)
Glucose-Capillary: 209 mg/dL — ABNORMAL HIGH (ref 65–99)

## 2017-03-25 LAB — HEMOGLOBIN A1C
Hgb A1c MFr Bld: 10 % — ABNORMAL HIGH (ref 4.8–5.6)
MEAN PLASMA GLUCOSE: 240.3 mg/dL

## 2017-03-25 SURGERY — MULTIPLE EXTRACTION WITH ALVEOLOPLASTY
Anesthesia: General | Site: Mouth

## 2017-03-25 MED ORDER — LIDOCAINE-EPINEPHRINE 2 %-1:100000 IJ SOLN
INTRAMUSCULAR | Status: AC
  Filled 2017-03-25: qty 1

## 2017-03-25 MED ORDER — OXYCODONE HCL 5 MG/5ML PO SOLN: 5.0000 mg | Freq: Once | ORAL | Status: DC | PRN

## 2017-03-25 MED ORDER — MIDAZOLAM HCL 5 MG/5ML IJ SOLN
INTRAMUSCULAR | Status: DC | PRN
  Administered 2017-03-25: 2 mg via INTRAVENOUS

## 2017-03-25 MED ORDER — OXYCODONE-ACETAMINOPHEN 5-325 MG PO TABS: 1.0000 | ORAL_TABLET | Freq: Four times a day (QID) | ORAL | 0 refills | Status: DC | PRN

## 2017-03-25 MED ORDER — FENTANYL CITRATE (PF) 100 MCG/2ML IJ SOLN
INTRAMUSCULAR | Status: DC | PRN
  Administered 2017-03-25 (×2): 50 ug via INTRAVENOUS

## 2017-03-25 MED ORDER — PROPOFOL 10 MG/ML IV BOLUS
INTRAVENOUS | Status: AC
  Filled 2017-03-25: qty 20

## 2017-03-25 MED ORDER — HYDROMORPHONE HCL 1 MG/ML IJ SOLN
0.2500 mg | INTRAMUSCULAR | Status: DC | PRN
Start: 2017-03-25 — End: 2017-03-25

## 2017-03-25 MED ORDER — LACTATED RINGERS IV SOLN
INTRAVENOUS | Status: DC
  Administered 2017-03-25: 07:00:00 via INTRAVENOUS

## 2017-03-25 MED ORDER — OXYCODONE HCL 5 MG PO TABS: 5.0000 mg | ORAL_TABLET | Freq: Once | ORAL | Status: DC | PRN

## 2017-03-25 MED ORDER — MIDAZOLAM HCL 2 MG/2ML IJ SOLN
INTRAMUSCULAR | Status: AC
  Filled 2017-03-25: qty 2

## 2017-03-25 MED ORDER — LIDOCAINE 2% (20 MG/ML) 5 ML SYRINGE
INTRAMUSCULAR | Status: DC | PRN
  Administered 2017-03-25: 60 mg via INTRAVENOUS

## 2017-03-25 MED ORDER — DEXAMETHASONE SODIUM PHOSPHATE 10 MG/ML IJ SOLN
INTRAMUSCULAR | Status: DC | PRN
  Administered 2017-03-25: 5 mg via INTRAVENOUS

## 2017-03-25 MED ORDER — FENTANYL CITRATE (PF) 250 MCG/5ML IJ SOLN
INTRAMUSCULAR | Status: AC
  Filled 2017-03-25: qty 5

## 2017-03-25 MED ORDER — CEFAZOLIN SODIUM-DEXTROSE 2-3 GM-%(50ML) IV SOLR
INTRAVENOUS | Status: DC | PRN
  Administered 2017-03-25: 2 g via INTRAVENOUS

## 2017-03-25 MED ORDER — PROPOFOL 10 MG/ML IV BOLUS
INTRAVENOUS | Status: DC | PRN
  Administered 2017-03-25: 150 mg via INTRAVENOUS

## 2017-03-25 MED ORDER — AMOXICILLIN 500 MG PO CAPS: 500.0000 mg | ORAL_CAPSULE | Freq: Three times a day (TID) | ORAL | 0 refills | Status: DC

## 2017-03-25 MED ORDER — SUCCINYLCHOLINE CHLORIDE 200 MG/10ML IV SOSY
PREFILLED_SYRINGE | INTRAVENOUS | Status: AC
  Filled 2017-03-25: qty 10

## 2017-03-25 MED ORDER — SUCCINYLCHOLINE CHLORIDE 20 MG/ML IJ SOLN
INTRAMUSCULAR | Status: DC | PRN
  Administered 2017-03-25: 100 mg via INTRAVENOUS

## 2017-03-25 MED ORDER — PROMETHAZINE HCL 25 MG/ML IJ SOLN: 6.2500 mg | INTRAMUSCULAR | Status: DC | PRN

## 2017-03-25 MED ORDER — 0.9 % SODIUM CHLORIDE (POUR BTL) OPTIME
TOPICAL | Status: DC | PRN
  Administered 2017-03-25: 1000 mL

## 2017-03-25 MED ORDER — MEPERIDINE HCL 25 MG/ML IJ SOLN: 6.2500 mg | INTRAMUSCULAR | Status: DC | PRN

## 2017-03-25 MED ORDER — ONDANSETRON HCL 4 MG/2ML IJ SOLN
INTRAMUSCULAR | Status: AC
  Filled 2017-03-25: qty 2

## 2017-03-25 MED ORDER — LIDOCAINE 2% (20 MG/ML) 5 ML SYRINGE
INTRAMUSCULAR | Status: AC
  Filled 2017-03-25: qty 5

## 2017-03-25 MED ORDER — SODIUM CHLORIDE 0.9 % IR SOLN
Status: DC | PRN
  Administered 2017-03-25: 1000 mL

## 2017-03-25 MED ORDER — DEXAMETHASONE SODIUM PHOSPHATE 10 MG/ML IJ SOLN
INTRAMUSCULAR | Status: AC
  Filled 2017-03-25: qty 1

## 2017-03-25 MED ORDER — LIDOCAINE-EPINEPHRINE 2 %-1:100000 IJ SOLN
INTRAMUSCULAR | Status: DC | PRN
  Administered 2017-03-25: 15 mL via INTRADERMAL

## 2017-03-25 SURGICAL SUPPLY — 32 items
BUR CROSS CUT FISSURE 1.6 (BURR) ×2 IMPLANT
BUR CROSS CUT FISSURE 1.6MM (BURR) ×1
BUR EGG ELITE 4.0 (BURR) ×2 IMPLANT
BUR EGG ELITE 4.0MM (BURR) ×1
CANISTER SUCT 3000ML PPV (MISCELLANEOUS) ×3 IMPLANT
COVER SURGICAL LIGHT HANDLE (MISCELLANEOUS) ×3 IMPLANT
CRADLE DONUT ADULT HEAD (MISCELLANEOUS) ×3 IMPLANT
DECANTER SPIKE VIAL GLASS SM (MISCELLANEOUS) ×2 IMPLANT
DRAPE U-SHAPE 76X120 STRL (DRAPES) ×3 IMPLANT
FLUID NSS /IRRIG 1000 ML XXX (MISCELLANEOUS) ×3 IMPLANT
GAUZE PACKING FOLDED 2  STR (GAUZE/BANDAGES/DRESSINGS) ×2
GAUZE PACKING FOLDED 2 STR (GAUZE/BANDAGES/DRESSINGS) ×1 IMPLANT
GLOVE BIO SURGEON STRL SZ 6.5 (GLOVE) ×2 IMPLANT
GLOVE BIO SURGEON STRL SZ7.5 (GLOVE) ×3 IMPLANT
GLOVE BIO SURGEONS STRL SZ 6.5 (GLOVE) ×1
GLOVE BIOGEL PI IND STRL 7.0 (GLOVE) IMPLANT
GLOVE BIOGEL PI INDICATOR 7.0 (GLOVE)
GOWN STRL REUS W/ TWL LRG LVL3 (GOWN DISPOSABLE) ×1 IMPLANT
GOWN STRL REUS W/ TWL XL LVL3 (GOWN DISPOSABLE) ×1 IMPLANT
GOWN STRL REUS W/TWL LRG LVL3 (GOWN DISPOSABLE) ×3
GOWN STRL REUS W/TWL XL LVL3 (GOWN DISPOSABLE) ×3
KIT BASIN OR (CUSTOM PROCEDURE TRAY) ×3 IMPLANT
KIT ROOM TURNOVER OR (KITS) ×3 IMPLANT
NEEDLE 22X1 1/2 (OR ONLY) (NEEDLE) ×6 IMPLANT
NEEDLE 27GAX1X1/2 (NEEDLE) IMPLANT
NS IRRIG 1000ML POUR BTL (IV SOLUTION) ×3 IMPLANT
PAD ARMBOARD 7.5X6 YLW CONV (MISCELLANEOUS) ×3 IMPLANT
SUT CHROMIC 3 0 PS 2 (SUTURE) ×5 IMPLANT
SYR CONTROL 10ML LL (SYRINGE) ×3 IMPLANT
TRAY ENT MC OR (CUSTOM PROCEDURE TRAY) ×3 IMPLANT
TUBING IRRIGATION (MISCELLANEOUS) ×3 IMPLANT
YANKAUER SUCT BULB TIP NO VENT (SUCTIONS) ×3 IMPLANT

## 2017-03-25 NOTE — H&P (Signed)
H&P documentation  -History and Physical Reviewed  -Patient has been re-examined  -No change in the plan of care  Marilyn [name]  

## 2017-03-25 NOTE — Op Note (Signed)
NAME:  Marilyn [name], Marilyn [name]                     ACCOUNT NO.:  MEDICAL RECORD NO.:  [id number]  LOCATION:                                 FACILITY:  PHYSICIAN:  Gae Bon, M.D.  DATE OF BIRTH:  1966/03/27  DATE OF PROCEDURE:  03/25/2017 DATE OF DISCHARGE:                              OPERATIVE REPORT   PREOPERATIVE DIAGNOSES:  Nonrestorable teeth secondary to dental caries numbers 4, 6, 7, 8, 9, 10, 11, 19, 23, 24, 25, 26, 29, 30.  POSTOPERATIVE DIAGNOSES:  Nonrestorable teeth secondary to dental caries numbers 4, 6, 7, 8, 9, 10, 11, 19, 23, 24, 25, 26, 29, 30.  PROCEDURE:  Extraction teeth numbers 4, 6, 7, 8, 9, 10, 11, 19, 23, 24, 25, 26, 29, 30 and alveoplasty right and left maxilla.  SURGEON:  Gae Bon, M.D.  ANESTHESIA:  General.  Dr. Sabra Heck attending.  DESCRIPTION OF PROCEDURE:  The patient was taken to the operating room, placed on the table in supine position.  General anesthesia was administered and a nasal endotracheal tube was placed and secured.  The eyes were protected and the patient was draped for surgery.  Time-out was performed.  The posterior pharynx was suctioned and a throat pack was placed.  A 2% lidocaine with 1:100,000 epinephrine was infiltrated in an inferior alveolar block on the right and left side and in buccal infiltration of the anterior mandible.  Additional anesthetic solution was given in the maxilla buccally and palatally around the teeth to be removed.  A bite block was placed on the right side of the mouth and a sweetheart retractor was used to retract the tongue.  A #15 blade was used to make an incision around tooth #19 and around teeth numbers 23, 24, 25, 26 in the anterior mandible.  In the maxilla, the incision was started approximately 1 cm proximal to tooth #11 along the alveolar crest, carried forward buccally and palatally around teeth numbers 11, 10, 9, 8, 7, and 6.  The periosteum was reflected from around these teeth.  The  301 elevator was used to remove tooth #19 and to elevate teeth numbers 23, 24, 25, 26.  Then, these lower teeth were removed with the Asch forceps.  In the maxilla, the teeth numbers 11, 10, 9, 8, 7, and 6 were elevated with a 301 elevator.  The upper #150 forceps was used to remove teeth numbers 11, 9, 8, 7.  Teeth numbers 6 and 10 fractured during attempted removal.  Then, the Stryker handpiece was used to remove bone circumferentially from around these teeth and then the teeth were removed with a 150 forceps.  The periosteum was then reflected to expose the alveolar crest in the left maxilla and an egg- shaped bur and bone file were used to perform alveoplasty.  Then, this area was irrigated and closed with 3-0 chromic.  The anterior mandible was closed with 2 sutures, most suture was required on tooth #19.  The bite block and sweetheart retractor were repositioned to the other side of the mouth.  Tooth #4 was removed with the 150 forceps and then, a 15 blade was used to  make an incision around teeth numbers 29 and 30.  The periosteum was reflected.  The teeth were elevated and removed from the mouth with dental forceps.  The sockets were curetted, irrigated, and closed with 3-0 chromic.  The oral cavity was then irrigated and suctioned.  Throat pack was removed.  The patient was left in care of anesthesia for extubation and transport to recovery and plans for discharge home.  ESTIMATED BLOOD LOSS:  Minimal.  COMPLICATIONS:  None.  SPECIMENS:  None.     Gae Bon, M.D.     SMJ/MEDQ  D:  03/25/2017  T:  03/25/2017  Job:  [id number]

## 2017-03-25 NOTE — Op Note (Signed)
03/25/2017  9:37 AM  PATIENT:  Marilyn [name]  51 y.o. female  PRE-OPERATIVE DIAGNOSIS:  NON RESTORABLE TEETH # 4, 6, 7, 8, 9, 10, 11, 19, 23, 24, 25, 26, 29, 30  POST-OPERATIVE DIAGNOSIS:  SAME  PROCEDURE:  Procedure(s): MULTIPLE EXTRACTION WITH ALVEOLOPLASTY (teeth #s four, six, seven, eight, nine, ten, eleven, one, twenty-three, twenty-four, twenty-five, twenty-six, twenty-nine, thirty)  SURGEON:  Surgeon(s): [name], [name]  ANESTHESIA:   local and general  EBL:  minimal  DRAINS: none   SPECIMEN:  No Specimen  COUNTS:  YES  PLAN OF CARE: Discharge to home after PACU  PATIENT DISPOSITION:  PACU - hemodynamically stable.   PROCEDURE DETAILS: Dictation # [id number] Marilyn [name], DMD 03/25/2017 9:37 AM

## 2017-03-25 NOTE — Anesthesia Procedure Notes (Signed)
Procedure Name: Intubation Date/Time: 03/25/2017 9:04 AM Performed by: Lieutenant Diego, CRNA Pre-anesthesia Checklist: Patient identified, Emergency Drugs available, Suction available and Patient being monitored Patient Re-evaluated:Patient Re-evaluated prior to induction Oxygen Delivery Method: Circle system utilized Preoxygenation: Pre-oxygenation with 100% oxygen Induction Type: IV induction Ventilation: Mask ventilation without difficulty Laryngoscope Size: Mac and 3 Grade View: Grade I Nasal Tubes: Nasal prep performed, Nasal Rae, Left and Magill forceps- large, utilized Tube size: 7.0 mm Placement Confirmation: ETT inserted through vocal cords under direct vision,  positive ETCO2 and breath sounds checked- equal and bilateral Secured at: 21 cm Tube secured with: Tape Dental Injury: Teeth and Oropharynx as per pre-operative assessment

## 2017-03-25 NOTE — Transfer of Care (Signed)
Immediate Anesthesia Transfer of Care Note  Patient: Marilyn [name]  Procedure(s) Performed: MULTIPLE EXTRACTION WITH ALVEOLOPLASTY (teeth #s four, six, seven, eight, nine, ten, eleven, one, twenty-three, twenty-four, twenty-five, twenty-six, twenty-nine, thirty) (N/A Mouth)  Patient Location: PACU  Anesthesia Type:General  Level of Consciousness: sedated  Airway & Oxygen Therapy: Patient Spontanous Breathing and Patient connected to face mask oxygen  Post-op Assessment: Report given to RN and Post -op Vital signs reviewed and stable  Post vital signs: Reviewed and stable  Last Vitals:  Vitals:   03/25/17 0617  BP: 120/73  Pulse: (!) 118  Resp: 20  Temp: 36.5 C  SpO2: 100%    Last Pain:  Vitals:   03/25/17 0639  PainSc: 1          Complications: No apparent anesthesia complications

## 2017-03-25 NOTE — Anesthesia Postprocedure Evaluation (Signed)
Anesthesia Post Note  Patient: Marilyn [name]  Procedure(s) Performed: MULTIPLE EXTRACTION WITH ALVEOLOPLASTY (teeth #s four, six, seven, eight, nine, ten, eleven, one, twenty-three, twenty-four, twenty-five, twenty-six, twenty-nine, thirty) (N/A Mouth)     Patient location during evaluation: PACU Anesthesia Type: General Level of consciousness: awake and alert Pain management: pain level controlled Vital Signs Assessment: post-procedure vital signs reviewed and stable Respiratory status: spontaneous breathing, nonlabored ventilation and respiratory function stable Cardiovascular status: blood pressure returned to baseline and stable Postop Assessment: no apparent nausea or vomiting Anesthetic complications: no    Last Vitals:  Vitals:   03/25/17 1029 03/25/17 1045  BP:    Pulse: (!) 118 (!) 115  Resp: 20   Temp: (!) 36 C 36.6 C  SpO2: 96% 95%    Last Pain:  Vitals:   03/25/17 0639  PainSc: 1                  Lynda Rainwater

## 2017-03-25 NOTE — Anesthesia Preprocedure Evaluation (Signed)
Anesthesia Evaluation  Patient identified by MRN, date of birth, ID band Patient awake    Reviewed: Allergy & Precautions, NPO status , Patient's Chart, lab work & pertinent test results, reviewed documented beta blocker date and time   Airway Mallampati: II  TM Distance: >3 FB Neck ROM: Full    Dental no notable dental hx.    Pulmonary neg pulmonary ROS, asthma ,    Pulmonary exam normal breath sounds clear to auscultation       Cardiovascular hypertension, Pt. on medications and Pt. on home beta blockers negative cardio ROS Normal cardiovascular exam Rhythm:Regular Rate:Normal     Neuro/Psych  Headaches, negative neurological ROS  negative psych ROS   GI/Hepatic negative GI ROS, Neg liver ROS,   Endo/Other  negative endocrine ROSdiabetes, Type 2, Insulin DependentHypothyroidism   Renal/GU negative Renal ROS  negative genitourinary   Musculoskeletal negative musculoskeletal ROS (+) Arthritis , Osteoarthritis,    Abdominal (+) + obese,   Peds negative pediatric ROS (+)  Hematology negative hematology ROS (+)   Anesthesia Other Findings   Reproductive/Obstetrics negative OB ROS                             Anesthesia Physical Anesthesia Plan  ASA: III  Anesthesia Plan: General   Post-op Pain Management:    Induction: Intravenous  PONV Risk Score and Plan: 3 and Ondansetron, Midazolam and Dexamethasone  Airway Management Planned: Nasal ETT  Additional Equipment:   Intra-op Plan:   Post-operative Plan: Extubation in OR  Informed Consent: I have reviewed the patients History and Physical, chart, labs and discussed the procedure including the risks, benefits and alternatives for the proposed anesthesia with the patient or authorized representative who has indicated his/her understanding and acceptance.   Dental advisory given  Plan Discussed with: CRNA  Anesthesia Plan  Comments:         Anesthesia Quick Evaluation

## 2017-03-26 ENCOUNTER — Encounter (HOSPITAL_COMMUNITY): Payer: Self-pay | Admitting: Oral Surgery

## 2017-03-30 ENCOUNTER — Telehealth: Payer: Self-pay

## 2017-03-30 ENCOUNTER — Telehealth: Payer: Self-pay | Admitting: Hematology

## 2017-03-30 NOTE — Telephone Encounter (Signed)
Scheduled appt per 12/24 sch msg - left voicemail for patient regarding appts.

## 2017-03-30 NOTE — Telephone Encounter (Signed)
Patient called stating she needs to see Dr. Irene Limbo because she has a pain on her left side towards her back. Informed patient that Dr. Irene Limbo is out of the office until January 3rd. Patient denies fever or any other symptoms. Offered patient an appointment to be seen in the symptom management clinic. Patient declined stating she only wants to see Dr. Irene Limbo. Reiterated that Dr. Irene Limbo is out of the office. Patient stated she will wait and schedule an appointment with Dr. Irene Limbo when he returns. Patient to call back if symptoms persist or become worse however, she stated again that she will only see Dr. Irene Limbo.

## 2017-04-04 ENCOUNTER — Ambulatory Visit (HOSPITAL_BASED_OUTPATIENT_CLINIC_OR_DEPARTMENT_OTHER): Payer: Medicaid Other

## 2017-04-04 ENCOUNTER — Other Ambulatory Visit (HOSPITAL_BASED_OUTPATIENT_CLINIC_OR_DEPARTMENT_OTHER): Payer: Medicaid Other

## 2017-04-04 ENCOUNTER — Ambulatory Visit: Payer: Medicaid Other

## 2017-04-04 VITALS — BP 123/79 | HR 112 | Temp 99.0°F | Resp 20

## 2017-04-04 DIAGNOSIS — C3491 Malignant neoplasm of unspecified part of right bronchus or lung: Secondary | ICD-10-CM

## 2017-04-04 DIAGNOSIS — C792 Secondary malignant neoplasm of skin: Secondary | ICD-10-CM

## 2017-04-04 DIAGNOSIS — C770 Secondary and unspecified malignant neoplasm of lymph nodes of head, face and neck: Secondary | ICD-10-CM

## 2017-04-04 DIAGNOSIS — Z5112 Encounter for antineoplastic immunotherapy: Secondary | ICD-10-CM | POA: Diagnosis present

## 2017-04-04 DIAGNOSIS — Z95828 Presence of other vascular implants and grafts: Secondary | ICD-10-CM

## 2017-04-04 DIAGNOSIS — Z452 Encounter for adjustment and management of vascular access device: Secondary | ICD-10-CM

## 2017-04-04 LAB — COMPREHENSIVE METABOLIC PANEL
ALT: 10 U/L (ref 0–55)
AST: 13 U/L (ref 5–34)
Albumin: 3 g/dL — ABNORMAL LOW (ref 3.5–5.0)
Alkaline Phosphatase: 96 U/L (ref 40–150)
Anion Gap: 8 mEq/L (ref 3–11)
BUN: 4.9 mg/dL — ABNORMAL LOW (ref 7.0–26.0)
CHLORIDE: 108 meq/L (ref 98–109)
CO2: 29 meq/L (ref 22–29)
Calcium: 8.8 mg/dL (ref 8.4–10.4)
Creatinine: 0.8 mg/dL (ref 0.6–1.1)
Glucose: 111 mg/dl (ref 70–140)
POTASSIUM: 3.6 meq/L (ref 3.5–5.1)
SODIUM: 144 meq/L (ref 136–145)
Total Bilirubin: 0.36 mg/dL (ref 0.20–1.20)
Total Protein: 7.1 g/dL (ref 6.4–8.3)

## 2017-04-04 LAB — CBC & DIFF AND RETIC
BASO%: 0.2 % (ref 0.0–2.0)
Basophils Absolute: 0 10*3/uL (ref 0.0–0.1)
EOS%: 6.7 % (ref 0.0–7.0)
Eosinophils Absolute: 0.3 10*3/uL (ref 0.0–0.5)
HCT: 29.8 % — ABNORMAL LOW (ref 34.8–46.6)
HGB: 9.5 g/dL — ABNORMAL LOW (ref 11.6–15.9)
IMMATURE RETIC FRACT: 1.5 % — AB (ref 1.60–10.00)
LYMPH%: 22.9 % (ref 14.0–49.7)
MCH: 28.3 pg (ref 25.1–34.0)
MCHC: 31.9 g/dL (ref 31.5–36.0)
MCV: 88.7 fL (ref 79.5–101.0)
MONO#: 0.4 10*3/uL (ref 0.1–0.9)
MONO%: 7.7 % (ref 0.0–14.0)
NEUT%: 62.5 % (ref 38.4–76.8)
NEUTROS ABS: 3 10*3/uL (ref 1.5–6.5)
NRBC: 0 % (ref 0–0)
Platelets: 267 10*3/uL (ref 145–400)
RBC: 3.36 10*6/uL — AB (ref 3.70–5.45)
RDW: 12.8 % (ref 11.2–14.5)
Retic %: 1.15 % (ref 0.70–2.10)
Retic Ct Abs: 38.64 10*3/uL (ref 33.70–90.70)
WBC: 4.8 10*3/uL (ref 3.9–10.3)
lymph#: 1.1 10*3/uL (ref 0.9–3.3)

## 2017-04-04 MED ORDER — HEPARIN SOD (PORK) LOCK FLUSH 100 UNIT/ML IV SOLN
500.0000 [IU] | Freq: Once | INTRAVENOUS | Status: AC | PRN
  Administered 2017-04-04: 500 [IU]
  Filled 2017-04-04: qty 5

## 2017-04-04 MED ORDER — SODIUM CHLORIDE 0.9% FLUSH
10.0000 mL | INTRAVENOUS | Status: DC | PRN
  Administered 2017-04-04: 10 mL
  Filled 2017-04-04: qty 10

## 2017-04-04 MED ORDER — SODIUM CHLORIDE 0.9 % IJ SOLN
10.0000 mL | INTRAMUSCULAR | Status: DC | PRN
  Administered 2017-04-04: 10 mL via INTRAVENOUS
  Filled 2017-04-04: qty 10

## 2017-04-04 MED ORDER — SODIUM CHLORIDE 0.9 % IV SOLN
240.0000 mg | Freq: Once | INTRAVENOUS | Status: AC
  Administered 2017-04-04: 240 mg via INTRAVENOUS
  Filled 2017-04-04: qty 24

## 2017-04-04 MED ORDER — SODIUM CHLORIDE 0.9 % IV SOLN
Freq: Once | INTRAVENOUS | Status: AC
  Administered 2017-04-04: 11:00:00 via INTRAVENOUS

## 2017-04-06 NOTE — Progress Notes (Signed)
[name]    HEMATOLOGY/ONCOLOGY CLINIC NOTE  Date of Service: 04/07/2017   Patient Care Team: [name], FNP as PCP - General (Nurse Practitioner)  CHIEF COMPLAINTS/PURPOSE OF CONSULTATION:  f/u Lung Adenocarcinoma.  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  Current treatment:  Nivolumab q2 weeks  PreviousTreatment -Concurrent Chemo-radiation with Carboplatin/Taxol. Completed RT on 02/13/2016 and has then completed 2 cycles of carboplatin + taxol. -Started Maintenance Durvalumab from  05/24/2016 q2weeks, switched to Nivolumab q2weeks on 12/13/16 once metastatic disease noted -s/p palliative RT to rt sided Subcutaneous metastatic mass.    HISTORY OF PRESENTING ILLNESS: Plz see my previous note for details on initial presentation.  INTERVAL HISTORY   Rogena Deupree Klutz presents to the office today for followup of her metastatic lung cancer.  Of note, she underwent tooth extraction on 03/25/17. She is recovering well.   Today she notes she has pain in her right flank, further posterior than her previous pain from soft tissue metastases that was treated with RT. She denies injuring this area or having moved in a way that would cause a muscle strain. She notes her energy levels has been well overall. She notes her blood sugar has been around 200-300. Her HB A1c is 10.   No diarrhea, no rash no other prohibitive toxicity from Nivolumab.  MEDICAL HISTORY:  Past Medical History:  Diagnosis Date  . Arthritis   . Asthma   . Cardiomyopathy ([place])   . CHF (congestive heart failure) ([place])   . Diabetes mellitus    Type II  . Hemorrhoids   . Hypercholesteremia   . Hypertension   . Hypothyroidism   . Lung cancer ([facility])    Lung, Mets to skin- right flank  . 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 Obese   . Pneumonia 2017  . Tachycardia     SURGICAL HISTORY: Past Surgical History:  Procedure Laterality Date  . CESAREAN SECTION    . COLONOSCOPY     . I/D  arm Right 2013  . IR FLUORO GUIDE PORT INSERTION RIGHT  02/18/2017  . IR GENERIC HISTORICAL  01/08/2016   IR US GUIDE VASC ACCESS RIGHT 01/08/2016 [name], DO [facility]  . IR GENERIC HISTORICAL  01/08/2016   IR FLUORO GUIDE CV LINE RIGHT 01/08/2016 [name], DO [facility]  . IR US GUIDE VASC ACCESS RIGHT  02/18/2017  . MULTIPLE EXTRACTIONS WITH ALVEOLOPLASTY N/A 03/25/2017   Procedure: MULTIPLE EXTRACTION WITH ALVEOLOPLASTY (teeth #s four, six, seven, eight, nine, ten, eleven, one, twenty-three, twenty-four, twenty-five, twenty-six, twenty-nine, thirty);  Surgeon: [name], [name];  Location: [facility];  Service: Oral Surgery;  Laterality: N/A;  . TUBAL LIGATION    . US guided core needle biopsy     of right lower neck/supraclavicular lymph nodes    SOCIAL HISTORY: Social History   Socioeconomic History  . Marital status: Divorced    Spouse name: Not on file  . Number of children: Not on file  . Years of education: Not on file  . Highest education level: Not on file  Social Needs  . Financial resource strain: Not on file  . Food insecurity - worry: Not on file  . Food insecurity - inability: Not on file  . Transportation needs - medical: Not on file  . Transportation needs - non-medical: Not on file  Occupational History  . Not on file  Tobacco Use  . Smoking status: Never Smoker  . Smokeless tobacco: Never  Used  Substance and Sexual Activity  . Alcohol use: No  . Drug use: No  . Sexual activity: Not Currently  Other Topics Concern  . Not on file  Social History Narrative   No bird or mold exposure. No recent travel.    FAMILY HISTORY: Family History  Problem Relation Age of Onset  . Heart failure Mother   . Hypertension Mother   . Cancer Neg Hx   . Rheumatologic disease Neg Hx     ALLERGIES:  has No Known Allergies.  MEDICATIONS:  Current Outpatient Medications  Medication Sig Dispense Refill  . amoxicillin (AMOXIL) 500 MG capsule Take 1  capsule (500 mg total) by mouth 3 (three) times daily. 21 capsule 0  . aspirin EC 81 MG tablet Take 1 tablet (81 mg total) by mouth daily. 60 tablet 2  . benzonatate (TESSALON) 100 MG capsule Take 1 capsule (100 mg total) by mouth 3 (three) times daily as needed for cough. 60 capsule 0  . carvedilol (COREG) 6.25 MG tablet Take 1 tablet (6.25 mg total) by mouth 2 (two) times daily. (Patient taking differently: Take 6.25 mg by mouth daily. ) 180 tablet 3  . DULoxetine (CYMBALTA) 60 MG capsule Take 1 capsule (60 mg total) by mouth daily. 30 capsule 2  . insulin aspart (NOVOLOG) 100 UNIT/ML FlexPen As per sliding scale instructions (Patient taking differently: Inject 0-10 Units into the skin 3 (three) times daily. As per sliding scale instructions) 15 mL 2  . Insulin Degludec (TRESIBA FLEXTOUCH) 200 UNIT/ML SOPN Inject 100 Units into the skin daily before breakfast.     . levothyroxine (SYNTHROID, LEVOTHROID) 125 MCG tablet Take 1 tablet (125 mcg total) by mouth daily before breakfast. 30 tablet 2  . lidocaine-prilocaine (EMLA) cream Apply 1 application as needed topically. (Patient taking differently: Apply 1 application topically daily as needed (prior to port being accessed--every 2 weeks.). ) 30 g 6  . loratadine (CLARITIN) 10 MG tablet Take 10 mg by mouth daily.    [name] losartan (COZAAR) 25 MG tablet Take 1 tablet (25 mg total) by mouth daily. 90 tablet 3  . miconazole (MICOTIN) 2 % powder Apply topically 2 (two) times daily. Apply to area of rash over the perineum 85 g 1  . omeprazole (PRILOSEC) 20 MG capsule Take 1 capsule (20 mg total) by mouth daily. 30 capsule 0  . oxyCODONE-acetaminophen (PERCOCET) 5-325 MG tablet Take 1 tablet by mouth every 6 (six) hours as needed. 30 tablet 0  . polyethylene glycol (MIRALAX) packet Take 17 g by mouth daily. 30 each 1  . senna-docusate (SENNA S) 8.6-50 MG tablet Take 2 tablets by mouth 2 (two) times daily. May reduce to 2 tab po HS as needed once regular BM  estabished (Patient taking differently: Take 2 tablets by mouth 2 (two) times daily as needed (FOR CONSTIPATION). =) 60 tablet 1   No current facility-administered medications for this visit.    Facility-Administered Medications Ordered in Other Visits  Medication Dose Route Frequency Provider Last Rate Last Dose  . sodium chloride 0.9 % injection 10 mL  10 mL Intravenous PRN [name], MD   10 mL at 05/24/16 1252    REVIEW OF SYSTEMS:    10 Point review of Systems was done is negative except as noted above.  PHYSICAL EXAMINATION: ECOG PERFORMANCE STATUS: 1 - Symptomatic but completely ambulatory  . Vitals:   04/07/17 1201  BP: 131/84  Pulse: (!) 118  Resp: 17  Temp:  98.4 F (36.9 C)  SpO2: 97%   Filed Weights   04/07/17 1201  Weight: 268 lb 3.2 oz (121.7 kg)   .Body mass index is 39.04 kg/m.  GENERAL:alert, in no acute distress and comfortable SKIN: radiation related skin injury over neck - healing EYES: normal, conjunctiva are pink and non-injected, sclera clear OROPHARYNX:oral thrush noted NECK: supple, no JVD, thyroid normal size, non-tender, without nodularity LYMPH:  no palpable lymphadenopathy in the cervical, axillary or inguinal.             LUNGS: clear to auscultation with normal respiratory effort. Distant breath sounds  HEART: regular rate & rhythm,  no murmurs and no lower extremity edema ABDOMEN: abdomen obese, soft, non-tender, normoactive bowel sounds , rt flank subcutaneous mass palpable. Musculoskeletal: no cyanosis of digits and no clubbing  PSYCH: alert & oriented x 3 with fluent speech NEURO: no focal motor/sensory deficits  LABORATORY DATA: .  [name] CBC Latest Ref Rng & Units 04/04/2017 03/21/2017 03/07/2017  WBC 3.9 - 10.3 10e3/uL 4.8 5.4 5.4  Hemoglobin 11.6 - 15.9 g/dL 9.5(L) 9.6(L) 9.4(L)  Hematocrit 34.8 - 46.6 % 29.8(L) 29.8(L) 28.7(L)  Platelets 145 - 400 10e3/uL 267 236 277   . CMP Latest Ref Rng & Units 04/04/2017  03/21/2017 03/07/2017  Glucose 70 - 140 mg/dl 111 159(H) 272(H)  BUN 7.0 - 26.0 mg/dL 4.9(L) 9.4 10.4  Creatinine 0.6 - 1.1 mg/dL 0.8 0.7 0.8  Sodium 136 - 145 mEq/L 144 139 136  Potassium 3.5 - 5.1 mEq/L 3.6 3.7 3.7  Chloride 101 - 111 mmol/L - - -  CO2 22 - 29 mEq/L _0 Calcium 8.4 - 10.4 mg/dL 8.8 8.9 9.0  Total Protein 6.4 - 8.3 g/dL 7.1 7.5 7.5  Total Bilirubin 0.20 - 1.20 mg/dL 0.36 0.35 <0.22  Alkaline Phos 40 - 150 U/L 96 93 84  AST 5 - 34 U/L _1 ALT 0 - 55 U/L _2 Component     Latest Ref Rng & Units 03/07/2017  TSH     0.308 - 3.960 m(IU)/L 6.468 (H)  T4,Free(Direct)     0.82 - 1.77 ng/dL 1.08               RADIOGRAPHIC STUDIES:  .No results found.   ASSESSMENT & PLAN:   52 year old African-American female with history of hypertension, diabetes, asthma, dysuria mass and morbid obesity with    #1 h/o Lung Adenocarcinoma Rt sided atleast Stage IIIB (on diagnosis) with large right paratracheal mass with mediastinal adenopathy that appears to have grown significantly over the last 6-7 months and rt supraclavicular LN +.  -Noted to have a small mass in the left adrenal on CT but PET/CT neg for metastatic disease. Patient has been a lifelong nonsmoker -MRI of the brain was negative for any metastatic disease. At diagnosis. -High PDL1 expression (90%) on [facility] One Neg for EGFR, ALK, ROS-1 and BRAF mutations. -Patient completed her planned definitive chemoradiation with carbo Taxol on 02/13/2016. No prohibitive toxicities other than some grade 1 skin desquamation and some grade 1-2 radiation esophagitis. She has subsequently received 2 out of 2 planned dose of carboplatin + Taxol. -Patient has completed 11 doses of maintenance   -CTA chest 10/29/2016  no evidence of lung cancer progression in the chest. No PE -CT abd/pelvis-10/29/2016  Interval 5.0 x 5.0 x 4.7 cm mass in the right lateral subcutaneous fat at the level of the mid abdomen.  This has CT  features compatible with a metastasis or primary neoplasm.  #2 Now with Metastatic lung adenocarcinoma with isolated metastases in the right lateral subcutaneous fat at the level of the midabdomen. she received maintenance Durvalumab from  05/24/2016 q2weeks, switched to Nivolumab q2weeks on 12/13/16 in the setting of Biopsy proven isolated metastatic disease. Given the tumor is strongly PDL1 positive and patient has only isolated metastatic disease was treated with palliative RT to metastases with significant improvement. Plan -labs stable -No prohibitive toxicities from Nivolumab. -continue Nivolumab at this time in the absence of overt disease progression or prohibitive toxicities. -patient does have some new flank discomfort on the rt side posteriorly behind the area of previous soft tissue mets which will need to be evaluated with rpt CT chest/abd/pelvis to evaluate for disease progression. - Continue when necessary Percocet for pain management   #3 h/o radiation pneumonitis - no new symptoms. Does has some cough with exertion but this has improved.  #4 Improving fatigue (grade 1)  -working with outpatient PT program - optimize DM2 management with PCP. Reported HgA1c 10. - improved fatigue with levothyroxine compliance with near normalization of FT4 levels.  - Continue Levolthyroxine 163mg ---will likely need larger dose but in the setting of baseline tachycardia we are gradually increasing dose to avoid ppt cardiac arrhythmias.   #5 normocytic anemia related to chemotherapy -resolving and stable Hg at 9.5  #6 Left chest wall pain due to costochondritis - mx with pain medications. CTA x 2 neg for PE. No significant pain at this time.  #7 Sinus tachycardia - on BB per cards , no PE on CTA x 2  -Still having sinus tachycardia . Partly from deconditioning and anemia. Seeing Dr. HDebara Pickettfrom cardiology  -Continues to be on Coreg  #8 neoplasm related pain is currently controlled  without significant medications. Clinically likely has sleep apnea . Would need to be careful with sedative medications . 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 Gave her topical and oral antifungals to help with this.  -She is still on fluconazole   Continue Nivolumab today and q2weeks as ordered Labs q2weeks CT chest/abd/pelvis in 1 week RTC with Dr KaIrene Limbon 2 weeks    All of the patients questions were answered with apparent satisfaction. The patient knows to call the clinic with any problems, questions or concerns.  I spent 20 minutes counseling the patient face to face. The total time spent in the appointment was 25 minutes and more than 50% was on counseling and direct patient cares.    GaSullivan LoneD MSLoma MarAHIVMS SCVa Medical Center - New[facility] CampusTValley [facility] Center Incematology/Oncology Physician CoDuncan Falls(Office):       [phone] cell):  [phone]):           [phone] This document serves as a record of services personally performed by GaSullivan LoneMD. It was created on his behalf by AmJoslyn Devona trained medical scribe. The creation of this record is based on the scribe's personal observations and the provider's statements to them.   .I have reviewed the above documentation for accuracy and completeness, and I agree with the above. .GBrunetta GeneraD

## 2017-04-07 ENCOUNTER — Inpatient Hospital Stay: Payer: Medicaid Other | Attending: Hematology | Admitting: Hematology

## 2017-04-07 ENCOUNTER — Encounter: Payer: Self-pay | Admitting: Hematology

## 2017-04-07 ENCOUNTER — Telehealth: Payer: Self-pay | Admitting: Hematology

## 2017-04-07 VITALS — BP 131/84 | HR 118 | Temp 98.4°F | Resp 17 | Ht 69.5 in | Wt 268.2 lb

## 2017-04-07 DIAGNOSIS — M940 Chondrocostal junction syndrome [Tietze]: Secondary | ICD-10-CM | POA: Insufficient documentation

## 2017-04-07 DIAGNOSIS — C770 Secondary and unspecified malignant neoplasm of lymph nodes of head, face and neck: Secondary | ICD-10-CM | POA: Diagnosis not present

## 2017-04-07 DIAGNOSIS — J45909 Unspecified asthma, uncomplicated: Secondary | ICD-10-CM | POA: Insufficient documentation

## 2017-04-07 DIAGNOSIS — E279 Disorder of adrenal gland, unspecified: Secondary | ICD-10-CM | POA: Diagnosis not present

## 2017-04-07 DIAGNOSIS — E1165 Type 2 diabetes mellitus with hyperglycemia: Secondary | ICD-10-CM | POA: Insufficient documentation

## 2017-04-07 DIAGNOSIS — I10 Essential (primary) hypertension: Secondary | ICD-10-CM | POA: Diagnosis not present

## 2017-04-07 DIAGNOSIS — Z794 Long term (current) use of insulin: Secondary | ICD-10-CM | POA: Insufficient documentation

## 2017-04-07 DIAGNOSIS — E785 Hyperlipidemia, unspecified: Secondary | ICD-10-CM | POA: Insufficient documentation

## 2017-04-07 DIAGNOSIS — C3491 Malignant neoplasm of unspecified part of right bronchus or lung: Secondary | ICD-10-CM | POA: Diagnosis not present

## 2017-04-07 DIAGNOSIS — C7989 Secondary malignant neoplasm of other specified sites: Secondary | ICD-10-CM | POA: Insufficient documentation

## 2017-04-07 DIAGNOSIS — C792 Secondary malignant neoplasm of skin: Secondary | ICD-10-CM | POA: Diagnosis not present

## 2017-04-07 DIAGNOSIS — I509 Heart failure, unspecified: Secondary | ICD-10-CM | POA: Insufficient documentation

## 2017-04-07 DIAGNOSIS — G893 Neoplasm related pain (acute) (chronic): Secondary | ICD-10-CM | POA: Insufficient documentation

## 2017-04-07 DIAGNOSIS — I429 Cardiomyopathy, unspecified: Secondary | ICD-10-CM | POA: Insufficient documentation

## 2017-04-07 DIAGNOSIS — Z923 Personal history of irradiation: Secondary | ICD-10-CM | POA: Insufficient documentation

## 2017-04-07 DIAGNOSIS — E039 Hypothyroidism, unspecified: Secondary | ICD-10-CM | POA: Insufficient documentation

## 2017-04-07 DIAGNOSIS — R5383 Other fatigue: Secondary | ICD-10-CM | POA: Insufficient documentation

## 2017-04-07 DIAGNOSIS — G629 Polyneuropathy, unspecified: Secondary | ICD-10-CM | POA: Insufficient documentation

## 2017-04-07 DIAGNOSIS — R109 Unspecified abdominal pain: Secondary | ICD-10-CM

## 2017-04-07 DIAGNOSIS — Z9221 Personal history of antineoplastic chemotherapy: Secondary | ICD-10-CM | POA: Insufficient documentation

## 2017-04-07 DIAGNOSIS — E119 Type 2 diabetes mellitus without complications: Secondary | ICD-10-CM

## 2017-04-07 DIAGNOSIS — E669 Obesity, unspecified: Secondary | ICD-10-CM | POA: Insufficient documentation

## 2017-04-07 DIAGNOSIS — R000 Tachycardia, unspecified: Secondary | ICD-10-CM | POA: Insufficient documentation

## 2017-04-07 DIAGNOSIS — Z5112 Encounter for antineoplastic immunotherapy: Secondary | ICD-10-CM | POA: Insufficient documentation

## 2017-04-07 DIAGNOSIS — Z8701 Personal history of pneumonia (recurrent): Secondary | ICD-10-CM | POA: Insufficient documentation

## 2017-04-07 DIAGNOSIS — E7800 Pure hypercholesterolemia, unspecified: Secondary | ICD-10-CM | POA: Insufficient documentation

## 2017-04-07 DIAGNOSIS — D6481 Anemia due to antineoplastic chemotherapy: Secondary | ICD-10-CM | POA: Insufficient documentation

## 2017-04-07 DIAGNOSIS — Z79899 Other long term (current) drug therapy: Secondary | ICD-10-CM | POA: Insufficient documentation

## 2017-04-07 DIAGNOSIS — M129 Arthropathy, unspecified: Secondary | ICD-10-CM | POA: Insufficient documentation

## 2017-04-07 NOTE — Telephone Encounter (Signed)
Scheduled appt per 1/3 los - Gave patient AVS and calender per los.

## 2017-04-11 IMAGING — CR DG CHEST 2V
2 series · 2 of 2 positions shown · non-contrast
Comparison: 04/16/2016

CLINICAL DATA: Chest tightness, history lung cancer, shortness of
breath

EXAM:
CHEST  2 VIEW

[w chest pa]
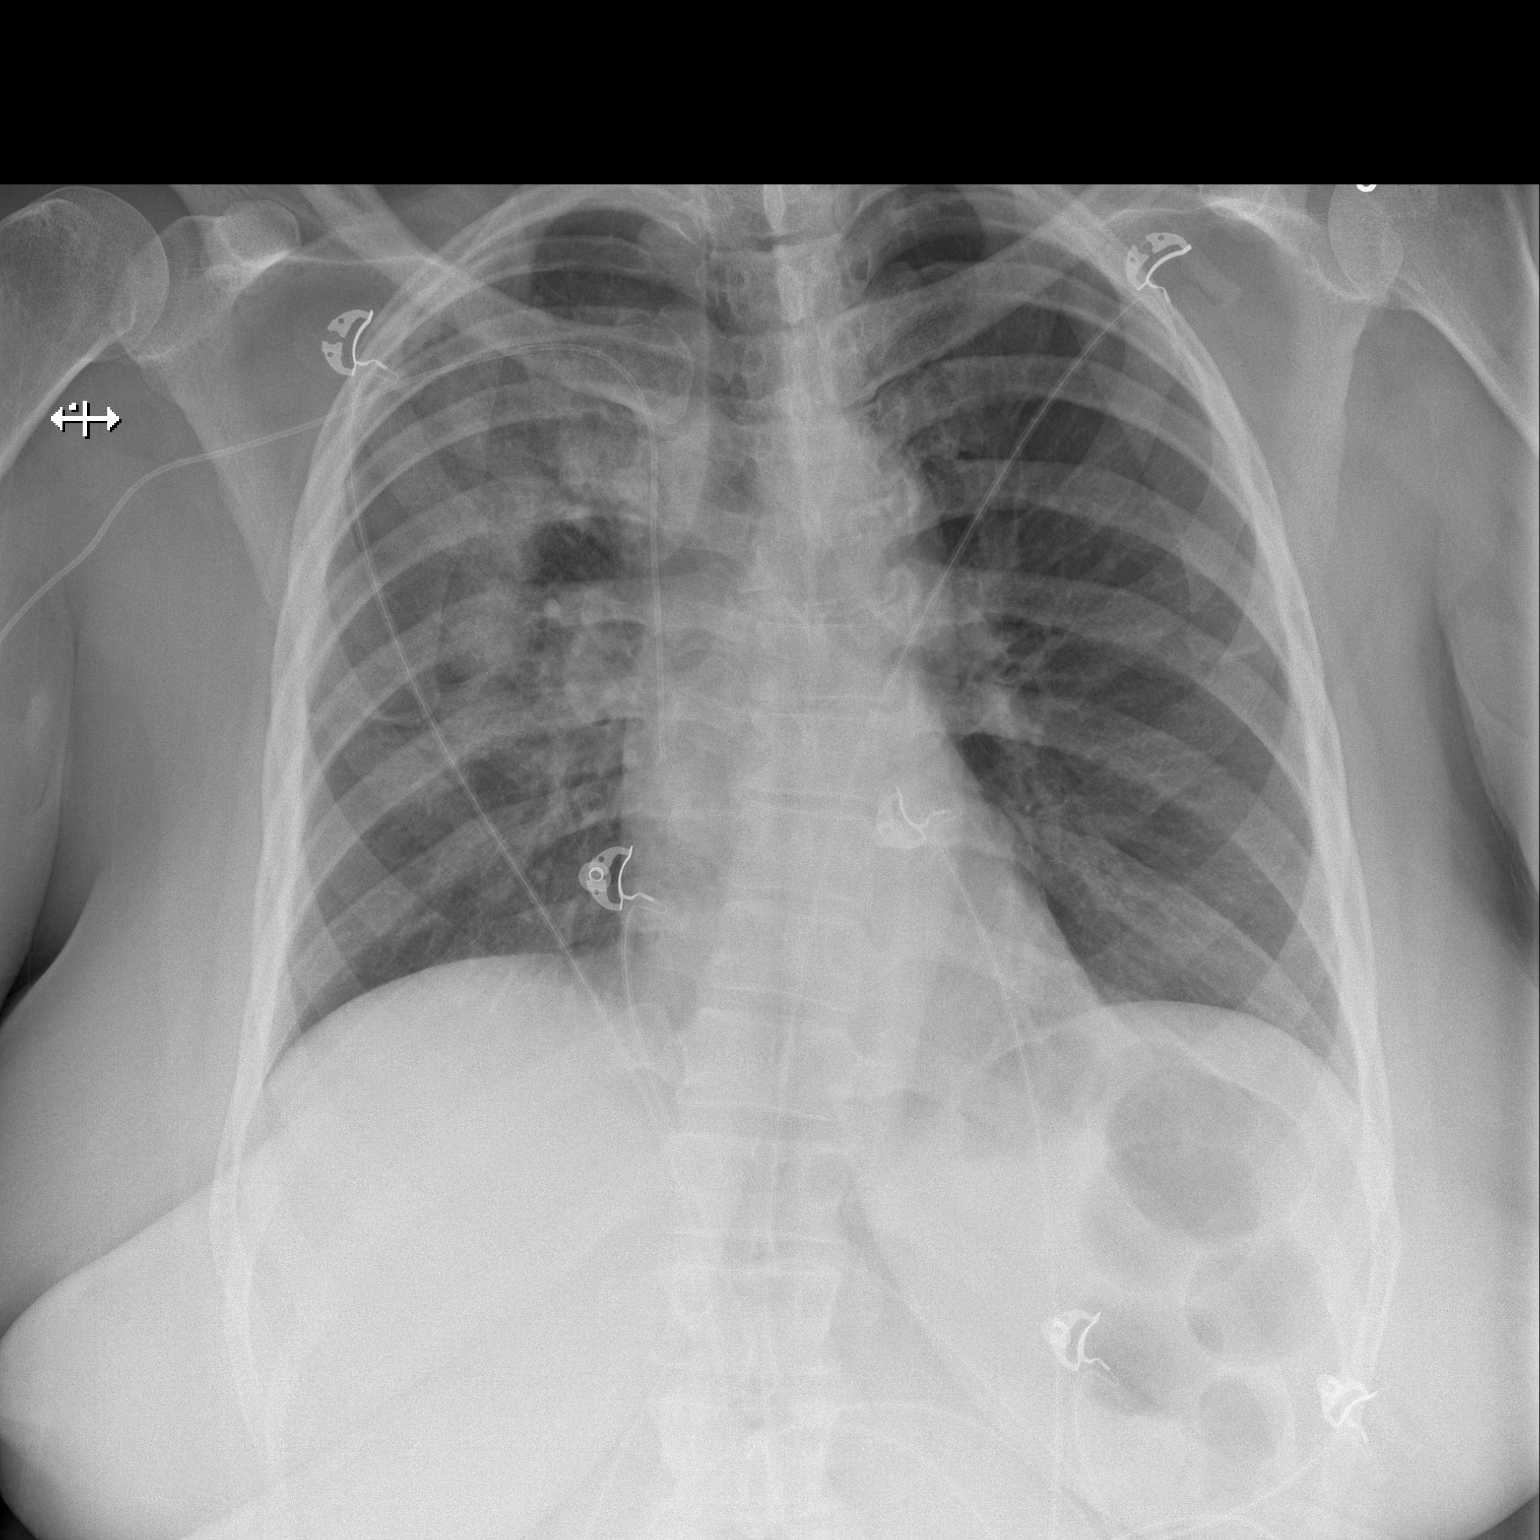

[w chest lat]
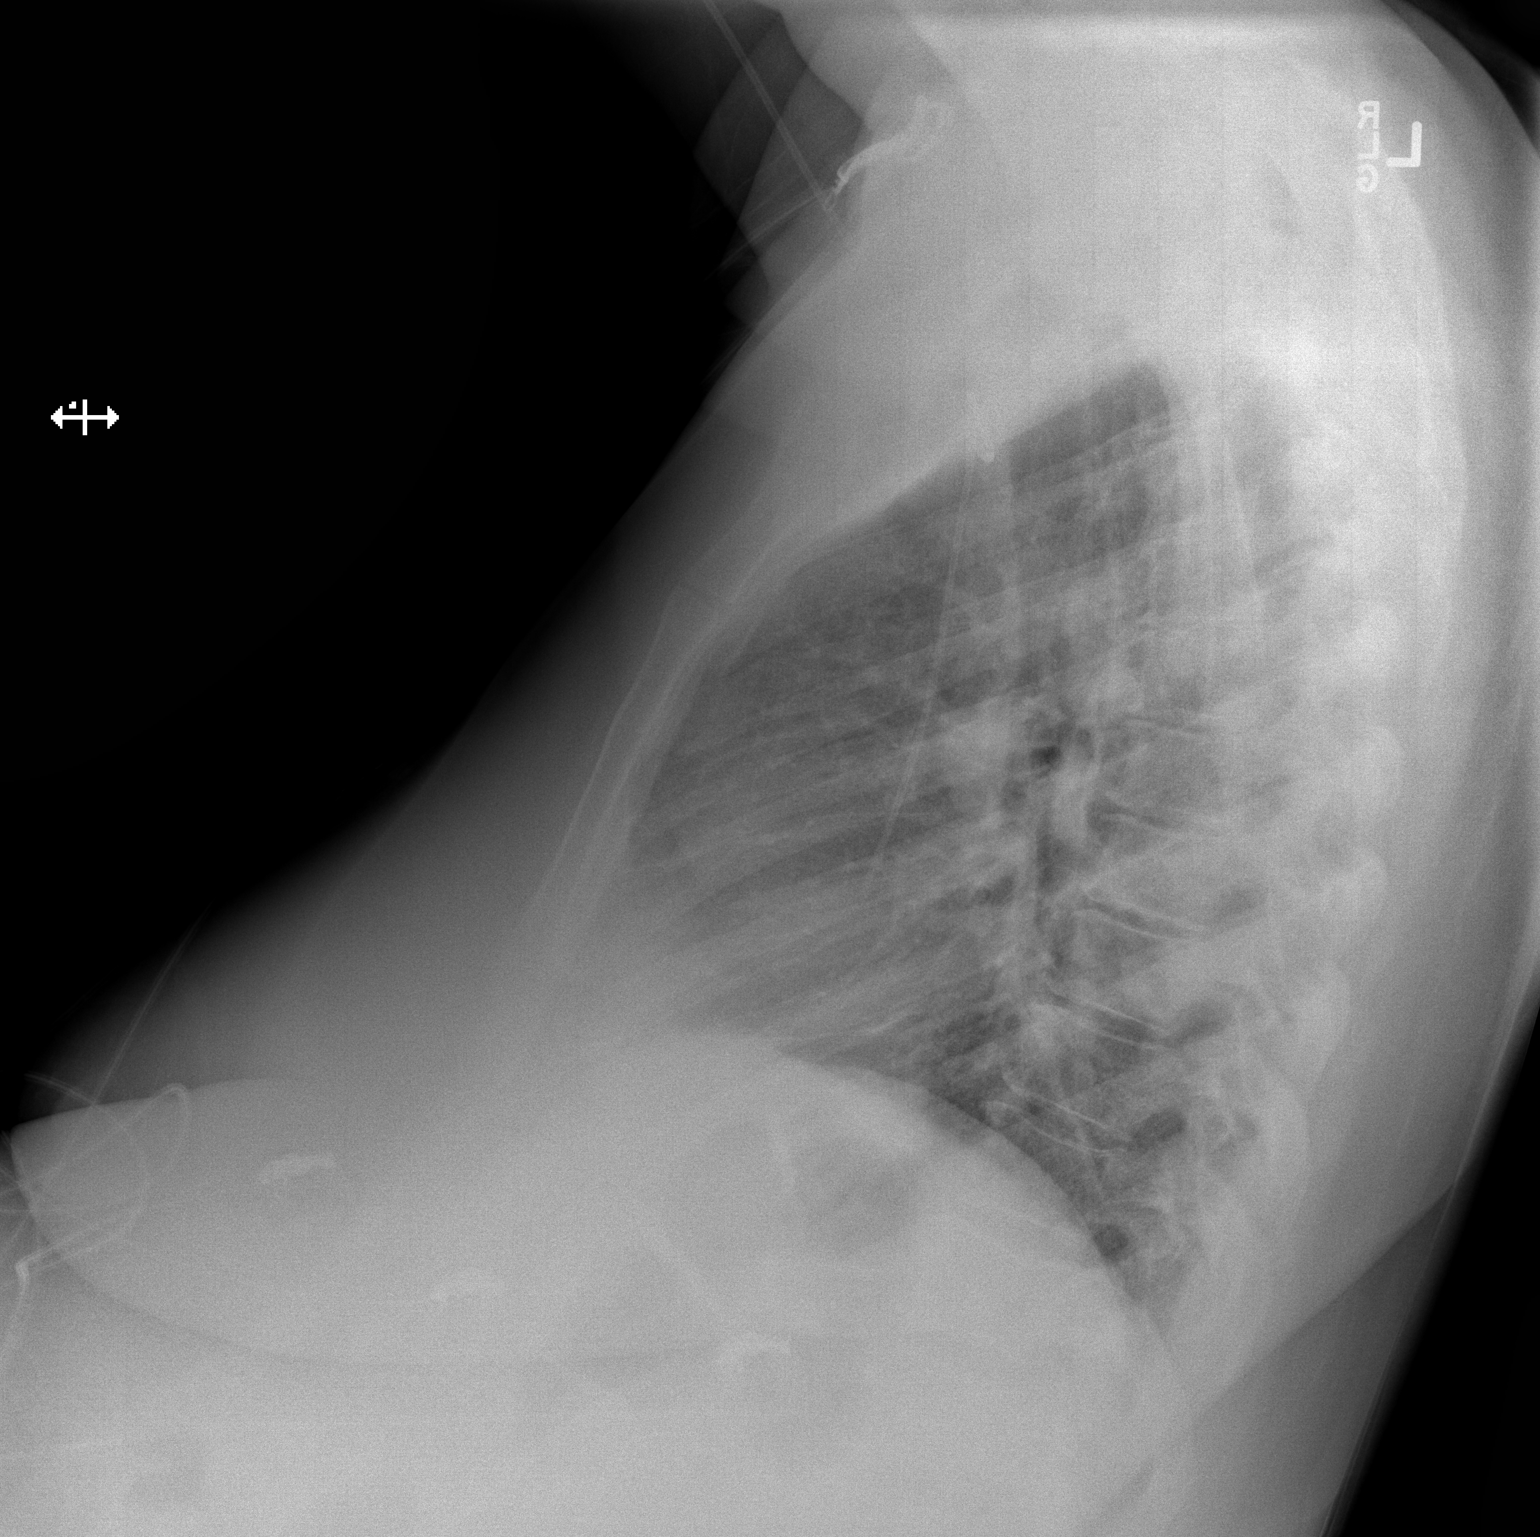

[2 of 2 positions shown; findings below may reference images not displayed]

FINDINGS: There is a right-sided PICC line with the tip projecting over the
SVC.

There is patchy right upper lobe airspace disease most concerning
for pneumonia. The lungs are otherwise clear. There is no pleural
effusion or pneumothorax. The heart and mediastinal contours are
unremarkable.

The osseous structures are unremarkable.
IMPRESSION: Right upper lobe pneumonia. Followup PA and lateral chest X-ray is
recommended in 3-4 weeks following trial of antibiotic therapy to
ensure resolution and exclude underlying malignancy.

## 2017-04-11 IMAGING — CT CT ANGIO CHEST
2 of 6 series · 18 of 36 positions shown · IV contrast (ISOVUE 370)
Comparison: Multiple exams, including 04/16/2016

CLINICAL DATA: Lung cancer. Dyspnea for 1 week. Chest wall
tightness. Cough. Shortness of breath. Chest pain.

EXAM:
CT ANGIOGRAPHY CHEST WITH CONTRAST
TECHNIQUE: Multidetector CT imaging of the chest was performed using the
standard protocol during bolus administration of intravenous
contrast. Multiplanar CT image reconstructions and MIPs were
obtained to evaluate the vascular anatomy.
CONTRAST:  70 cc Isovue 370

[Series 6: thins for pacs · axial · 0.73mm/px · z∈[-278,-15]mm · 17 of 293 slices shown]
[im 15/293  lung]
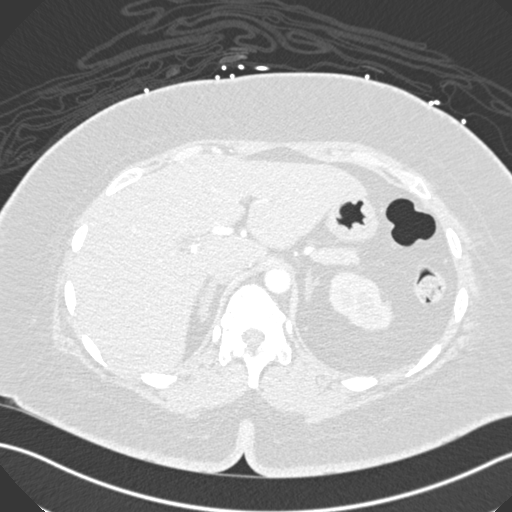
[im 30/293  mediastinal]
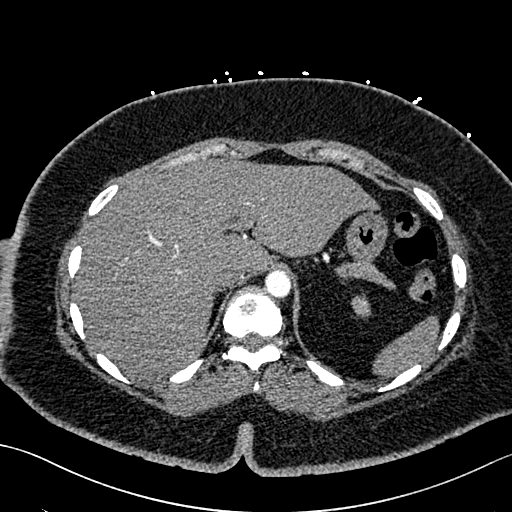
[im 44/293  lung]
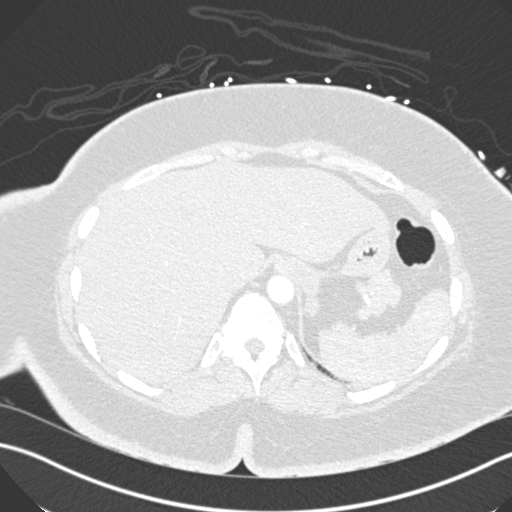
[im 59/293  mediastinal]
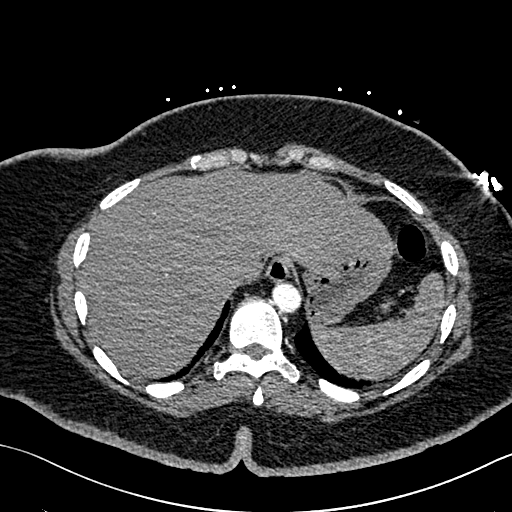
[im 88/293  lung]
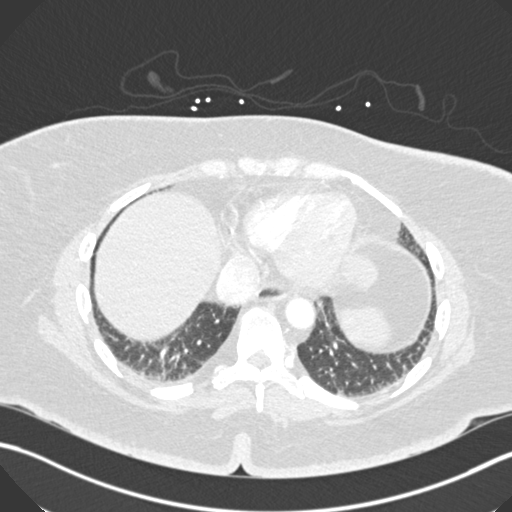
[im 103/293  mediastinal]
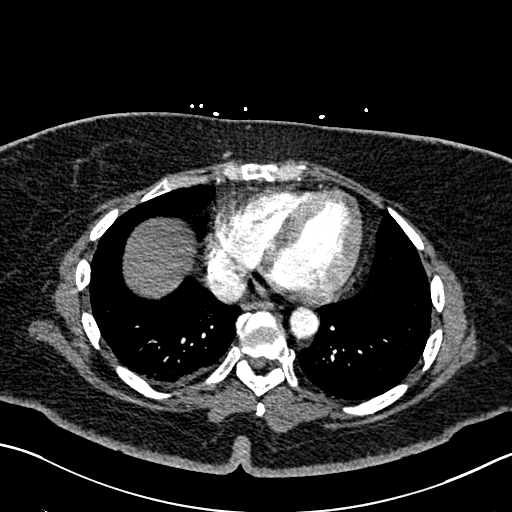
[im 117/293  lung]
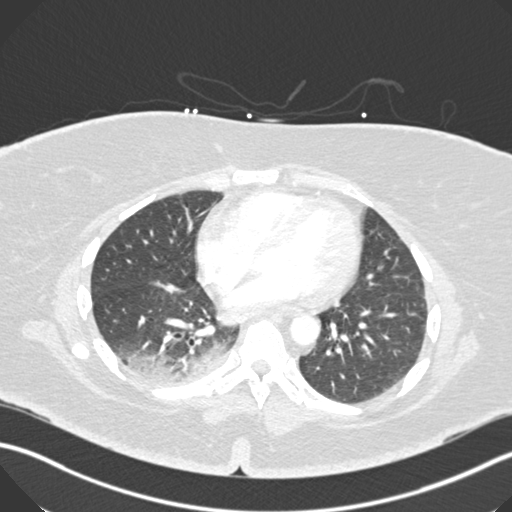
[im 132/293  mediastinal]
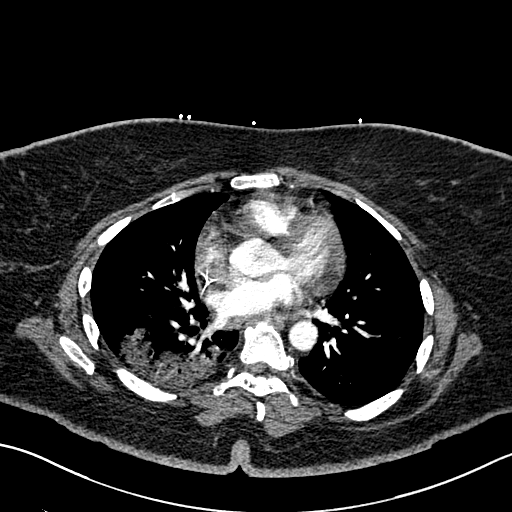
[im 147/293  lung]
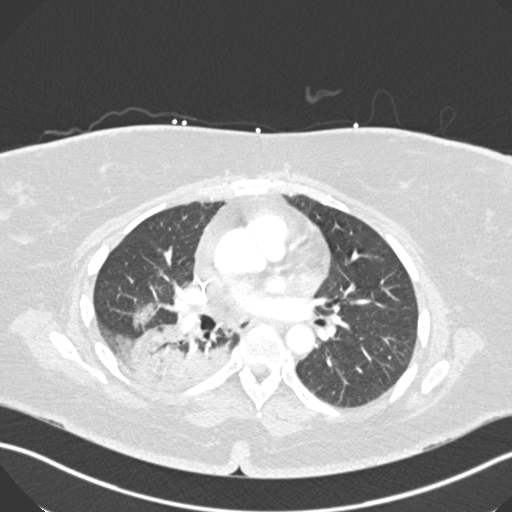
[im 161/293  mediastinal]
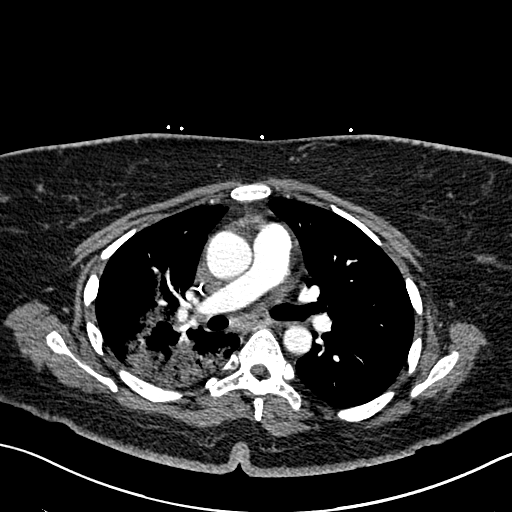
[im 176/293  lung]
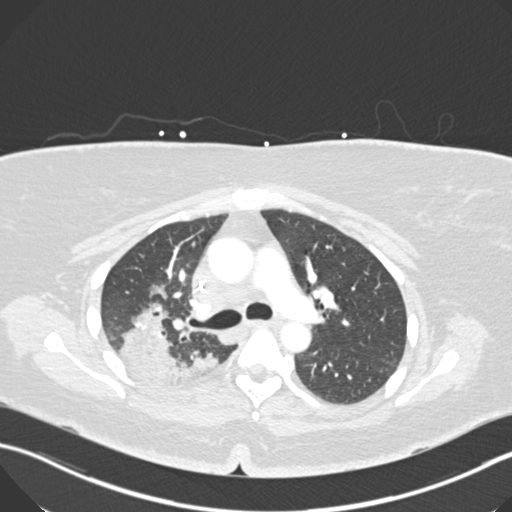
[im 190/293  mediastinal]
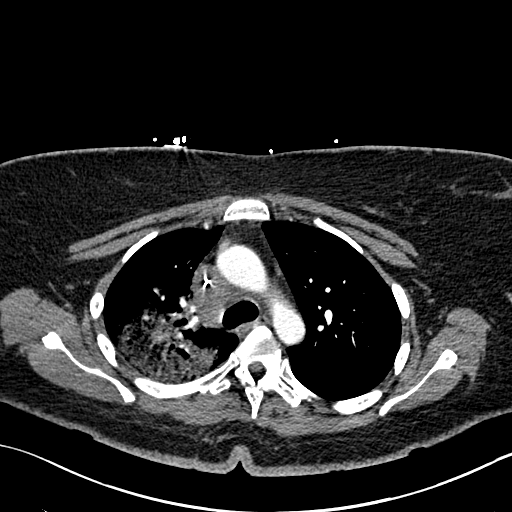
[im 205/293  lung]
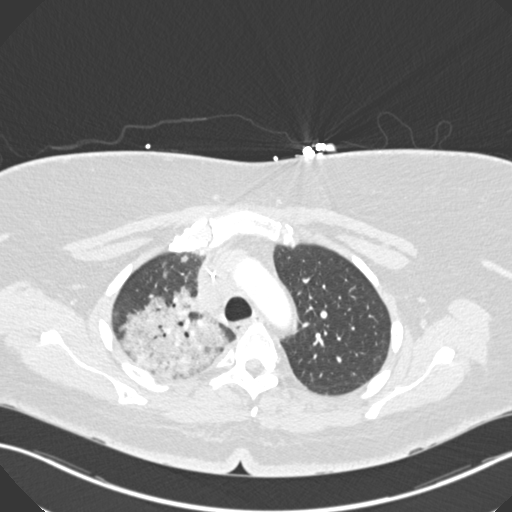
[im 234/293  mediastinal]
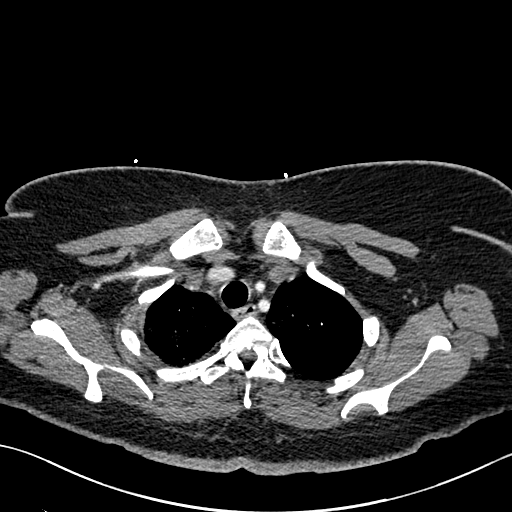
[im 249/293  lung]
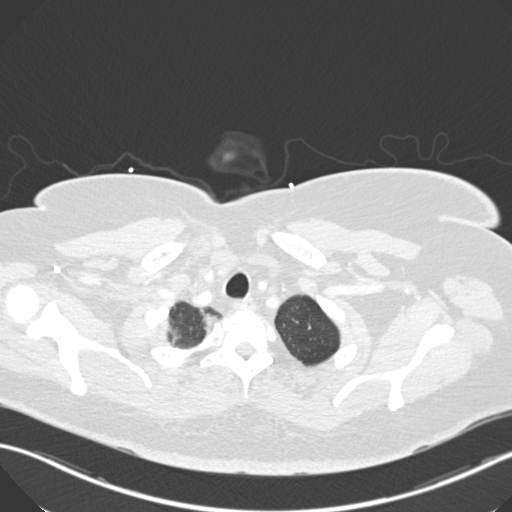
[im 263/293  mediastinal]
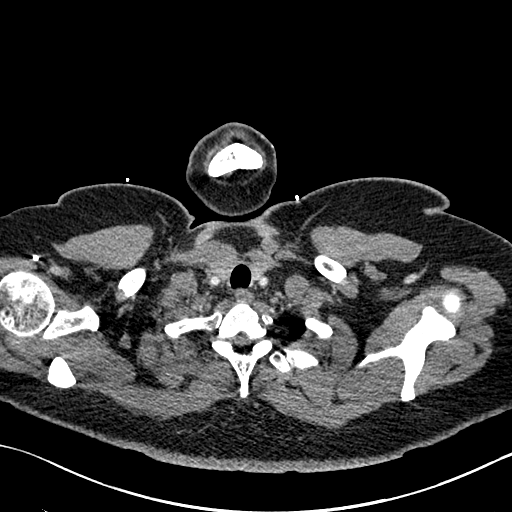
[im 278/293  lung]
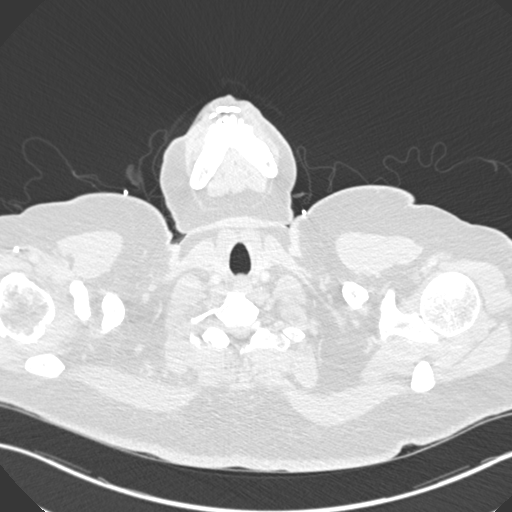

[Series 8: coronal mpr · coronal · 0.57mm/px · 1 of 128 slices shown]
[im 64/128  mediastinal]
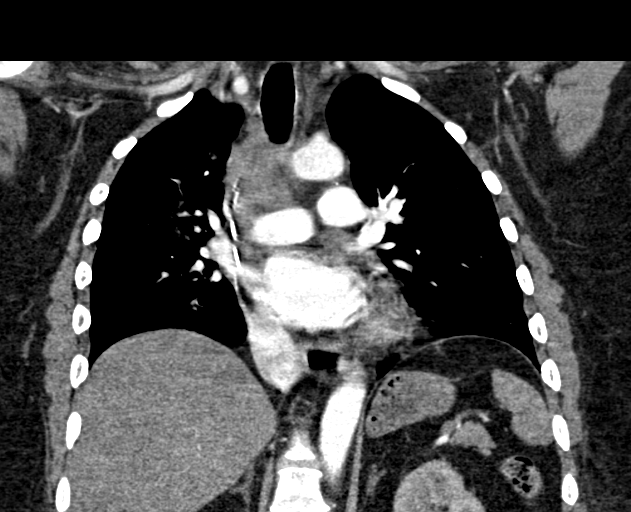

[18 of 36 positions shown; findings below may reference images not displayed]

FINDINGS: Cardiovascular: No filling defect is identified in the pulmonary
arterial tree to suggest pulmonary embolus. No acute aortic
findings.

Mediastinum/Nodes: Right paratracheal mass measures 3.6 cm
transverse, previously 3.7 cm, on image 37/4. This abuts the carina
and right mainstem bronchus, as before.

Lungs/Pleura: As shown on recent chest radiography but new from the
prior chest CT, there is increase in airspace opacity in the right
upper lobe and right lower lobe, especially in dependent portions,
with air bronchograms. Smaller amount of similar airspace opacity
medially in the left upper lobe on image [DATE]. Small amount of
involvement posteriorly in the right middle lobe on image 55/9.

Upper Abdomen: Unremarkable

Musculoskeletal: Thoracic spondylosis.

Review of the MIP images confirms the above findings.
IMPRESSION: 1. No filling defect is identified in the pulmonary arterial tree to
suggest pulmonary embolus.
2. Primarily dependent and sharply defined hazy airspace opacities
in the right upper lobe and right lower lobe, with some minimal
involvement of the left upper lobe and posterior portion of the
right middle lobe. At the margins these mostly conform to secondary
pulmonary lobules. Appearance suspicious for multilobar pneumonia,
aspiration pneumonia might have a similar appearance. Endobronchial
spread of tumor might conceivably have a similar appearance; the
right paratracheal mass is not appreciably changed in size or
morphology and there is no obvious tumor extension into the adjacent
trachea or right mainstem bronchus, and no airway occlusion is
observed.

## 2017-04-13 ENCOUNTER — Telehealth: Payer: Self-pay | Admitting: *Deleted

## 2017-04-13 NOTE — Telephone Encounter (Signed)
Pt called stating son passed away today, pt will be unable to make CT scan apt scheduled tomorrow.  Condolences offered.  This RN called central scheduling to inform them pt will be unable to make apt and asked them to call her to reschedule apt for another time.

## 2017-04-14 ENCOUNTER — Encounter (HOSPITAL_COMMUNITY): Payer: Self-pay

## 2017-04-14 ENCOUNTER — Ambulatory Visit (HOSPITAL_COMMUNITY): Payer: Medicaid Other

## 2017-04-15 NOTE — Progress Notes (Signed)
[name]    HEMATOLOGY/ONCOLOGY CLINIC NOTE  Date of Service: 04/18/2017   Patient Care Team: [name], FNP as PCP - General (Nurse Practitioner)  CHIEF COMPLAINTS/PURPOSE OF CONSULTATION:  f/u Lung Adenocarcinoma.  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  Current treatment:  Nivolumab q2 weeks  PreviousTreatment -Concurrent Chemo-radiation with Carboplatin/Taxol. Completed RT on 02/13/2016 and has then completed 2 cycles of carboplatin + taxol. -Started Maintenance Durvalumab from  05/24/2016 q2weeks, switched to Nivolumab q2weeks on 12/13/16 once metastatic disease noted -s/p palliative RT to rt sided Subcutaneous metastatic mass.    HISTORY OF PRESENTING ILLNESS: Plz see my previous note for details on initial presentation.  INTERVAL HISTORY   Marilyn [name] presents to the office today for followup of her metastatic lung cancer. She is presenting for treatment 10 of nivolumab. She is tolerating the Nivolumab without an overt toxicities. She is accompanied by her family member at this time. She reports that she is doing well overall. She notes that the staff are able to use the port line without difficulty. She will need a refill of her EMLA cream and her prilosec.   She hasn't been for her CT scan as of yet due to her son recently passing in the home. She reports that she is unsure of what occurred. She will follow up with scheduling to schedule her CT scan.  On review of systems, pt reports persisted pain to her right flank that is manageable at this time. She denies sore throat, rash, diarrhea, SOB, decreased energy levels, and any other symptoms. She reports soreness to the [place] area that started on yesterday.  MEDICAL HISTORY:  Past Medical History:  Diagnosis Date  . Arthritis   . Asthma   . Cardiomyopathy ([place])   . CHF (congestive heart failure) ([place])   . Diabetes mellitus    Type II  . Hemorrhoids   . Hypercholesteremia    . Hypertension   . Hypothyroidism   . Lung cancer ([facility])    Lung, Mets to skin- right flank  . 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 Obese   . Pneumonia 2017  . Tachycardia     SURGICAL HISTORY: Past Surgical History:  Procedure Laterality Date  . CESAREAN SECTION    . COLONOSCOPY    . I/D  arm Right 2013  . IR FLUORO GUIDE PORT INSERTION RIGHT  02/18/2017  . IR GENERIC HISTORICAL  01/08/2016   IR US GUIDE VASC ACCESS RIGHT 01/08/2016 [name], DO [facility]  . IR GENERIC HISTORICAL  01/08/2016   IR FLUORO GUIDE CV LINE RIGHT 01/08/2016 [name], DO [facility]  . IR US GUIDE VASC ACCESS RIGHT  02/18/2017  . MULTIPLE EXTRACTIONS WITH ALVEOLOPLASTY N/A 03/25/2017   Procedure: MULTIPLE EXTRACTION WITH ALVEOLOPLASTY (teeth #s four, six, seven, eight, nine, ten, eleven, one, twenty-three, twenty-four, twenty-five, twenty-six, twenty-nine, thirty);  Surgeon: [name], [name];  Location: [facility];  Service: Oral Surgery;  Laterality: N/A;  . TUBAL LIGATION    . US guided core needle biopsy     of right lower neck/supraclavicular lymph nodes    SOCIAL HISTORY: Social History   Socioeconomic History  . Marital status: Divorced    Spouse name: Not on file  . Number of children: Not on file  . Years of education: Not on file  . Highest education level: Not on file  Social Needs  . Financial resource strain: Not on file  . Food insecurity -  worry: Not on file  . Food insecurity - inability: Not on file  . Transportation needs - medical: Not on file  . Transportation needs - non-medical: Not on file  Occupational History  . Not on file  Tobacco Use  . Smoking status: Never Smoker  . Smokeless tobacco: Never Used  Substance and Sexual Activity  . Alcohol use: No  . Drug use: No  . Sexual activity: Not Currently  Other Topics Concern  . Not on file  Social History Narrative   No bird or mold exposure. No recent travel.    FAMILY HISTORY: Family History    Problem Relation Age of Onset  . Heart failure Mother   . Hypertension Mother   . Cancer Neg Hx   . Rheumatologic disease Neg Hx     ALLERGIES:  has No Known Allergies.  MEDICATIONS:  Current Outpatient Medications  Medication Sig Dispense Refill  . amoxicillin (AMOXIL) 500 MG capsule Take 1 capsule (500 mg total) by mouth 3 (three) times daily. 21 capsule 0  . aspirin EC 81 MG tablet Take 1 tablet (81 mg total) by mouth daily. 60 tablet 2  . benzonatate (TESSALON) 100 MG capsule Take 1 capsule (100 mg total) by mouth 3 (three) times daily as needed for cough. 60 capsule 0  . carvedilol (COREG) 6.25 MG tablet Take 1 tablet (6.25 mg total) by mouth 2 (two) times daily. (Patient taking differently: Take 6.25 mg by mouth daily. ) 180 tablet 3  . DULoxetine (CYMBALTA) 60 MG capsule Take 1 capsule (60 mg total) by mouth daily. 30 capsule 2  . insulin aspart (NOVOLOG) 100 UNIT/ML FlexPen As per sliding scale instructions (Patient taking differently: Inject 0-10 Units into the skin 3 (three) times daily. As per sliding scale instructions) 15 mL 2  . Insulin Degludec (TRESIBA FLEXTOUCH) 200 UNIT/ML SOPN Inject 100 Units into the skin daily before breakfast.     . levothyroxine (SYNTHROID, LEVOTHROID) 125 MCG tablet Take 1 tablet (125 mcg total) by mouth daily before breakfast. 30 tablet 2  . lidocaine-prilocaine (EMLA) cream Apply 1 application as needed topically. (Patient taking differently: Apply 1 application topically daily as needed (prior to port being accessed--every 2 weeks.). ) 30 g 6  . loratadine (CLARITIN) 10 MG tablet Take 10 mg by mouth daily.    [name] losartan (COZAAR) 25 MG tablet Take 1 tablet (25 mg total) by mouth daily. 90 tablet 3  . miconazole (MICOTIN) 2 % powder Apply topically 2 (two) times daily. Apply to area of rash over the perineum 85 g 1  . omeprazole (PRILOSEC) 20 MG capsule Take 1 capsule (20 mg total) by mouth daily. 30 capsule 0  . oxyCODONE-acetaminophen (PERCOCET)  5-325 MG tablet Take 1 tablet by mouth every 6 (six) hours as needed. 30 tablet 0  . polyethylene glycol (MIRALAX) packet Take 17 g by mouth daily. 30 each 1  . senna-docusate (SENNA S) 8.6-50 MG tablet Take 2 tablets by mouth 2 (two) times daily. May reduce to 2 tab po HS as needed once regular BM estabished (Patient taking differently: Take 2 tablets by mouth 2 (two) times daily as needed (FOR CONSTIPATION). =) 60 tablet 1   No current facility-administered medications for this visit.    Facility-Administered Medications Ordered in Other Visits  Medication Dose Route Frequency Provider Last Rate Last Dose  . sodium chloride 0.9 % injection 10 mL  10 mL Intravenous PRN [name], MD   10 mL at 05/24/16  1252    REVIEW OF SYSTEMS:    10 Point review of Systems was done is negative except as noted above.  PHYSICAL EXAMINATION: ECOG PERFORMANCE STATUS: 1 - Symptomatic but completely ambulatory  . Vitals:   04/18/17 1135  BP: 117/90  Pulse: (!) 110  Resp: 20  Temp: 97.7 F (36.5 C)  SpO2: 100%   Filed Weights   04/18/17 1135  Weight: 273 lb 6.4 oz (124 kg)   .Body mass index is 39.8 kg/m.  GENERAL:alert, in no acute distress and comfortable SKIN: radiation related skin injury over neck - healing EYES: normal, conjunctiva are pink and non-injected, sclera clear OROPHARYNX:oral thrush noted NECK: supple, no JVD, thyroid normal size, non-tender, without nodularity LYMPH:  no palpable lymphadenopathy in the cervical, axillary or inguinal.             LUNGS: clear to auscultation with normal respiratory effort. Distant breath sounds  HEART: regular rate & rhythm,  no murmurs and no lower extremity edema ABDOMEN: abdomen obese, soft, non-tender, normoactive bowel sounds , rt flank subcutaneous mass palpable. Musculoskeletal: no cyanosis of digits and no clubbing  PSYCH: alert & oriented x 3 with fluent speech NEURO: no focal motor/sensory deficits  LABORATORY DATA:  .  [name] CBC Latest Ref Rng & Units 04/18/2017 04/04/2017 03/21/2017  WBC 3.9 - 10.3 K/uL 5.3 4.8 5.4  Hemoglobin 11.6 - 15.9 g/dL - 9.5(L) 9.6(L)  Hematocrit 34.8 - 46.6 % 30.3(L) 29.8(L) 29.8(L)  Platelets 145 - 400 K/uL 229 267 236   . CMP Latest Ref Rng & Units 04/18/2017 04/04/2017 03/21/2017  Glucose 70 - 140 mg/dL 215(H) 111 159(H)  BUN 7 - 26 mg/dL 9 4.9(L) 9.4  Creatinine 0.60 - 1.10 mg/dL 0.80 0.8 0.7  Sodium 136 - 145 mmol/L 140 144 139  Potassium 3.3 - 4.7 mmol/L 3.6 3.6 3.7  Chloride 98 - 109 mmol/L 105 - -  CO2 22 - 29 mmol/L '28 29 27  ' Calcium 8.4 - 10.4 mg/dL 8.8 8.8 8.9  Total Protein 6.4 - 8.3 g/dL 6.9 7.1 7.5  Total Bilirubin 0.2 - 1.2 mg/dL <0.2(L) 0.36 0.35  Alkaline Phos 40 - 150 U/L 86 96 93  AST 5 - 34 U/L '12 13 12  ' ALT 0 - 55 U/L '9 10 9   ' Component     Latest Ref Rng & Units 03/07/2017  TSH     0.308 - 3.960 m(IU)/L 6.468 (H)  T4,Free(Direct)     0.82 - 1.77 ng/dL 1.08               RADIOGRAPHIC STUDIES:  .No results found.   ASSESSMENT & PLAN:   52 year old African-American female with history of hypertension, diabetes, asthma, dysuria mass and morbid obesity with    #1 h/o Lung Adenocarcinoma Rt sided atleast Stage IIIB (on diagnosis) with large right paratracheal mass with mediastinal adenopathy that appears to have grown significantly over the last 6-7 months and rt supraclavicular LN +.  -Noted to have a small mass in the left adrenal on CT but PET/CT neg for metastatic disease. Patient has been a lifelong nonsmoker -MRI of the brain was negative for any metastatic disease. At diagnosis. -High PDL1 expression (90%) on foundation One Neg for EGFR, ALK, ROS-1 and BRAF mutations. -Patient completed her planned definitive chemoradiation with carbo Taxol on 02/13/2016. No prohibitive toxicities other than some grade 1 skin desquamation and some grade 1-2 radiation esophagitis. She has subsequently received 2 out of 2 planned dose of  carboplatin + Taxol. -Patient has completed 11 doses of maintenance   -CTA chest 10/29/2016  no evidence of lung cancer progression in the chest. No PE -CT abd/pelvis-10/29/2016  Interval 5.0 x 5.0 x 4.7 cm mass in the right lateral subcutaneous fat at the level of the mid abdomen. This has CT features compatible with a metastasis or primary neoplasm. -Continue IV Nivolumab q2weeks - plz schedule next 4 doses Labs q2weeks RTC with Dr Irene Limbo in 4 weeks. CT - to be rescheduled by patient for before next clinic visit.    #2 Now with Metastatic lung adenocarcinoma with isolated metastases in the right lateral subcutaneous fat at the level of the midabdomen. she received maintenance Durvalumab from  05/24/2016 q2weeks, switched to Nivolumab q2weeks on 12/13/16 in the setting of Biopsy proven isolated metastatic disease. Given the tumor is strongly PDL1 positive and patient has only isolated metastatic disease was treated with palliative RT to metastases with significant improvement. Plan -labs stable -No prohibitive toxicities from Nivolumab. -continue Nivolumab at this time in the absence of overt disease progression or prohibitive toxicities. -patient does have some new flank discomfort on the rt side posteriorly behind the area of previous soft tissue mets which will need to be evaluated with rpt CT chest/abd/pelvis to evaluate for disease progression. - Continue when necessary Percocet for pain management   #3 h/o radiation pneumonitis - no new symptoms. Does has some cough with exertion but this has improved.  #4 Improving fatigue (grade 1)  -working with outpatient PT program - optimize DM2 management with PCP. Reported HgA1c 10. - improved fatigue with levothyroxine compliance with near normalization of FT4 levels.  - Continue Levolthyroxine 179mg ---will likely need larger dose but in the setting of baseline tachycardia we are gradually increasing dose to avoid ppt cardiac arrhythmias.   #5  normocytic anemia related to chemotherapy -resolving and stable Hg at 9.5  #6 Left chest wall pain due to costochondritis - mx with pain medications. CTA x 2 neg for PE. No significant pain at this time.  #7 Sinus tachycardia - on BB per cards , no PE on CTA x 2  -Still having sinus tachycardia . Partly from deconditioning and anemia. Seeing Dr. HDebara Pickettfrom cardiology  -Continues to be on Coreg  #8 neoplasm related pain is currently controlled without significant medications. Clinically likely has sleep apnea . Would need to be careful with sedative medications . 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 Gave her topical and oral antifungals to help with this.  -She is still on fluconazole   Continue IV Nivolumab q2weeks - plz schedule next 4 doses Labs q2weeks RTC with Dr KaIrene Limbon 4 weeks. CT - to be rescheduled by patient for before next clinic visit.  All of the patients questions were answered with apparent satisfaction. The patient knows to call the clinic with any problems, questions or concerns.  I spent 20 minutes counseling the patient face to face. The total time spent in the appointment was 25 minutes and more than 50% was on counseling and direct patient cares.    GaSullivan LoneD MSWest ChathamAHIVMS SCClay County HospitalTAurora [facility] - [facility] Hospitalematology/Oncology Physician CoHosp General [facility] - [place](Office):       [phone] cell):  [phone]):  [phone]   This document serves as a record of services personally performed by Sullivan Lone, MD. It was created on his behalf by Steva Colder, a trained medical scribe. The creation of this record is based on the scribe's personal observations and the provider's statements to them.   .I have reviewed  the above documentation for accuracy and completeness, and I agree with the above. [name] MD MS

## 2017-04-18 ENCOUNTER — Inpatient Hospital Stay: Payer: Medicaid Other

## 2017-04-18 ENCOUNTER — Encounter: Payer: Self-pay | Admitting: Hematology

## 2017-04-18 ENCOUNTER — Inpatient Hospital Stay (HOSPITAL_BASED_OUTPATIENT_CLINIC_OR_DEPARTMENT_OTHER): Payer: Medicaid Other | Admitting: Hematology

## 2017-04-18 VITALS — BP 117/90 | HR 110 | Temp 97.7°F | Resp 20 | Ht 69.5 in | Wt 273.4 lb

## 2017-04-18 DIAGNOSIS — R5383 Other fatigue: Secondary | ICD-10-CM

## 2017-04-18 DIAGNOSIS — Z5112 Encounter for antineoplastic immunotherapy: Secondary | ICD-10-CM

## 2017-04-18 DIAGNOSIS — Z452 Encounter for adjustment and management of vascular access device: Secondary | ICD-10-CM

## 2017-04-18 DIAGNOSIS — E039 Hypothyroidism, unspecified: Secondary | ICD-10-CM

## 2017-04-18 DIAGNOSIS — G893 Neoplasm related pain (acute) (chronic): Secondary | ICD-10-CM

## 2017-04-18 DIAGNOSIS — C7989 Secondary malignant neoplasm of other specified sites: Secondary | ICD-10-CM | POA: Diagnosis not present

## 2017-04-18 DIAGNOSIS — E7800 Pure hypercholesterolemia, unspecified: Secondary | ICD-10-CM

## 2017-04-18 DIAGNOSIS — I10 Essential (primary) hypertension: Secondary | ICD-10-CM

## 2017-04-18 DIAGNOSIS — M129 Arthropathy, unspecified: Secondary | ICD-10-CM

## 2017-04-18 DIAGNOSIS — C3491 Malignant neoplasm of unspecified part of right bronchus or lung: Secondary | ICD-10-CM

## 2017-04-18 DIAGNOSIS — C792 Secondary malignant neoplasm of skin: Secondary | ICD-10-CM

## 2017-04-18 DIAGNOSIS — Z923 Personal history of irradiation: Secondary | ICD-10-CM | POA: Diagnosis not present

## 2017-04-18 DIAGNOSIS — I509 Heart failure, unspecified: Secondary | ICD-10-CM

## 2017-04-18 DIAGNOSIS — G629 Polyneuropathy, unspecified: Secondary | ICD-10-CM

## 2017-04-18 DIAGNOSIS — D6481 Anemia due to antineoplastic chemotherapy: Secondary | ICD-10-CM

## 2017-04-18 DIAGNOSIS — M940 Chondrocostal junction syndrome [Tietze]: Secondary | ICD-10-CM | POA: Diagnosis not present

## 2017-04-18 DIAGNOSIS — Z95828 Presence of other vascular implants and grafts: Secondary | ICD-10-CM

## 2017-04-18 DIAGNOSIS — E279 Disorder of adrenal gland, unspecified: Secondary | ICD-10-CM | POA: Diagnosis not present

## 2017-04-18 DIAGNOSIS — E785 Hyperlipidemia, unspecified: Secondary | ICD-10-CM | POA: Diagnosis not present

## 2017-04-18 DIAGNOSIS — I429 Cardiomyopathy, unspecified: Secondary | ICD-10-CM

## 2017-04-18 DIAGNOSIS — E669 Obesity, unspecified: Secondary | ICD-10-CM

## 2017-04-18 DIAGNOSIS — J45909 Unspecified asthma, uncomplicated: Secondary | ICD-10-CM | POA: Diagnosis not present

## 2017-04-18 DIAGNOSIS — Z79899 Other long term (current) drug therapy: Secondary | ICD-10-CM | POA: Diagnosis not present

## 2017-04-18 DIAGNOSIS — Z794 Long term (current) use of insulin: Secondary | ICD-10-CM | POA: Diagnosis not present

## 2017-04-18 DIAGNOSIS — R000 Tachycardia, unspecified: Secondary | ICD-10-CM | POA: Diagnosis not present

## 2017-04-18 DIAGNOSIS — E1165 Type 2 diabetes mellitus with hyperglycemia: Secondary | ICD-10-CM | POA: Diagnosis not present

## 2017-04-18 DIAGNOSIS — Z9221 Personal history of antineoplastic chemotherapy: Secondary | ICD-10-CM | POA: Diagnosis not present

## 2017-04-18 DIAGNOSIS — Z8701 Personal history of pneumonia (recurrent): Secondary | ICD-10-CM

## 2017-04-18 LAB — CBC WITH DIFFERENTIAL (CANCER CENTER ONLY)
BASOS PCT: 0 %
Basophils Absolute: 0 10*3/uL (ref 0.0–0.1)
Eosinophils Absolute: 0.5 10*3/uL (ref 0.0–0.5)
Eosinophils Relative: 10 %
HEMATOCRIT: 30.3 % — AB (ref 34.8–46.6)
Hemoglobin: 9.9 g/dL — ABNORMAL LOW (ref 11.6–15.9)
LYMPHS PCT: 23 %
Lymphs Abs: 1.2 10*3/uL (ref 0.9–3.3)
MCH: 28.8 pg (ref 25.1–34.0)
MCHC: 32.7 g/dL (ref 31.5–36.0)
MCV: 88.1 fL (ref 79.5–101.0)
MONO ABS: 0.2 10*3/uL (ref 0.1–0.9)
MONOS PCT: 4 %
NEUTROS ABS: 3.4 10*3/uL (ref 1.5–6.5)
Neutrophils Relative %: 63 %
Platelet Count: 229 10*3/uL (ref 145–400)
RBC: 3.44 MIL/uL — ABNORMAL LOW (ref 3.70–5.45)
RDW: 13.3 % (ref 11.2–16.1)
WBC Count: 5.3 10*3/uL (ref 3.9–10.3)

## 2017-04-18 LAB — COMPREHENSIVE METABOLIC PANEL
ALT: 9 U/L (ref 0–55)
ANION GAP: 7 (ref 3–11)
AST: 12 U/L (ref 5–34)
Albumin: 2.9 g/dL — ABNORMAL LOW (ref 3.5–5.0)
Alkaline Phosphatase: 86 U/L (ref 40–150)
BUN: 9 mg/dL (ref 7–26)
CHLORIDE: 105 mmol/L (ref 98–109)
CO2: 28 mmol/L (ref 22–29)
Calcium: 8.8 mg/dL (ref 8.4–10.4)
Creatinine, Ser: 0.8 mg/dL (ref 0.60–1.10)
Glucose, Bld: 215 mg/dL — ABNORMAL HIGH (ref 70–140)
POTASSIUM: 3.6 mmol/L (ref 3.3–4.7)
SODIUM: 140 mmol/L (ref 136–145)
Total Bilirubin: <0.2 mg/dL — ABNORMAL LOW (ref 0.2–1.2)
Total Protein: 6.9 g/dL (ref 6.4–8.3)

## 2017-04-18 LAB — RETICULOCYTES
RBC.: 3.44 MIL/uL — ABNORMAL LOW (ref 3.70–5.45)
Retic Count, Absolute: 65.4 10*3/uL (ref 33.7–90.7)
Retic Ct Pct: 1.9 % (ref 0.7–2.1)

## 2017-04-18 MED ORDER — SODIUM CHLORIDE 0.9 % IJ SOLN
10.0000 mL | INTRAMUSCULAR | Status: DC | PRN
  Administered 2017-04-18: 10 mL via INTRAVENOUS
  Filled 2017-04-18: qty 10

## 2017-04-18 MED ORDER — SODIUM CHLORIDE 0.9 % IV SOLN
240.0000 mg | Freq: Once | INTRAVENOUS | Status: AC
  Administered 2017-04-18: 240 mg via INTRAVENOUS
  Filled 2017-04-18: qty 24

## 2017-04-18 MED ORDER — SODIUM CHLORIDE 0.9% FLUSH
10.0000 mL | INTRAVENOUS | Status: DC | PRN
  Administered 2017-04-18: 10 mL
  Filled 2017-04-18: qty 10

## 2017-04-18 MED ORDER — SODIUM CHLORIDE 0.9 % IV SOLN
Freq: Once | INTRAVENOUS | Status: AC
  Administered 2017-04-18: 13:00:00 via INTRAVENOUS

## 2017-04-18 MED ORDER — HEPARIN SOD (PORK) LOCK FLUSH 100 UNIT/ML IV SOLN
500.0000 [IU] | Freq: Once | INTRAVENOUS | Status: AC | PRN
  Administered 2017-04-18: 500 [IU]
  Filled 2017-04-18: qty 5

## 2017-04-18 NOTE — Patient Instructions (Signed)
[facility] Discharge Instructions for Patients Receiving Chemotherapy  Today you received the following chemotherapy agents Opdivo  To help prevent nausea and vomiting after your treatment, we encourage you to take your nausea medication as directed   If you develop nausea and vomiting that is not controlled by your nausea medication, call the clinic.   BELOW ARE SYMPTOMS THAT SHOULD BE REPORTED IMMEDIATELY:  *FEVER GREATER THAN 100.5 F  *CHILLS WITH OR WITHOUT FEVER  NAUSEA AND VOMITING THAT IS NOT CONTROLLED WITH YOUR NAUSEA MEDICATION  *UNUSUAL SHORTNESS OF BREATH  *UNUSUAL BRUISING OR BLEEDING  TENDERNESS IN MOUTH AND THROAT WITH OR WITHOUT PRESENCE OF ULCERS  *URINARY PROBLEMS  *BOWEL PROBLEMS  UNUSUAL RASH Items with * indicate a potential emergency and should be followed up as soon as possible.  Feel free to call the clinic should you have any questions or concerns. The clinic phone number is [phone].  Please show the CHEMO ALERT CARD at check-in to the Emergency Department and triage nurse.   

## 2017-04-19 ENCOUNTER — Telehealth: Payer: Self-pay | Admitting: Hematology

## 2017-04-19 NOTE — Telephone Encounter (Signed)
Already don per 1/14 los

## 2017-04-20 MED ORDER — LIDOCAINE-PRILOCAINE 2.5-2.5 % EX CREA: 1.0000 "application " | TOPICAL_CREAM | CUTANEOUS | 6 refills | Status: DC | PRN

## 2017-04-20 MED ORDER — OMEPRAZOLE 20 MG PO CPDR: 20.0000 mg | DELAYED_RELEASE_CAPSULE | Freq: Every day | ORAL | 1 refills | Status: DC

## 2017-04-29 ENCOUNTER — Other Ambulatory Visit: Payer: Medicaid Other

## 2017-04-29 ENCOUNTER — Ambulatory Visit: Payer: Medicaid Other

## 2017-05-02 ENCOUNTER — Inpatient Hospital Stay: Payer: Medicaid Other

## 2017-05-02 ENCOUNTER — Ambulatory Visit: Payer: Medicaid Other | Admitting: Hematology

## 2017-05-02 ENCOUNTER — Other Ambulatory Visit: Payer: Medicaid Other

## 2017-05-02 DIAGNOSIS — Z95828 Presence of other vascular implants and grafts: Secondary | ICD-10-CM

## 2017-05-02 DIAGNOSIS — C3491 Malignant neoplasm of unspecified part of right bronchus or lung: Secondary | ICD-10-CM

## 2017-05-02 DIAGNOSIS — Z5112 Encounter for antineoplastic immunotherapy: Secondary | ICD-10-CM | POA: Diagnosis not present

## 2017-05-02 DIAGNOSIS — C792 Secondary malignant neoplasm of skin: Secondary | ICD-10-CM

## 2017-05-02 DIAGNOSIS — Z452 Encounter for adjustment and management of vascular access device: Secondary | ICD-10-CM

## 2017-05-02 LAB — COMPREHENSIVE METABOLIC PANEL
ALBUMIN: 3 g/dL — AB (ref 3.5–5.0)
ALK PHOS: 91 U/L (ref 40–150)
ALT: 8 U/L (ref 0–55)
AST: 13 U/L (ref 5–34)
Anion gap: 9 (ref 3–11)
BUN: 10 mg/dL (ref 7–26)
CALCIUM: 9 mg/dL (ref 8.4–10.4)
CHLORIDE: 102 mmol/L (ref 98–109)
CO2: 26 mmol/L (ref 22–29)
CREATININE: 0.89 mg/dL (ref 0.60–1.10)
GFR calc Af Amer: >60 mL/min (ref >60)
GFR calc non Af Amer: >60 mL/min (ref >60)
GLUCOSE: 294 mg/dL — AB (ref 70–140)
Potassium: 4 mmol/L (ref 3.3–4.7)
Sodium: 137 mmol/L (ref 136–145)
Total Bilirubin: <0.2 mg/dL — ABNORMAL LOW (ref 0.2–1.2)
Total Protein: 7.4 g/dL (ref 6.4–8.3)

## 2017-05-02 LAB — CBC
HCT: 31.9 % — ABNORMAL LOW (ref 34.8–46.6)
Hemoglobin: 10.3 g/dL — ABNORMAL LOW (ref 11.6–15.9)
MCH: 28.9 pg (ref 25.1–34.0)
MCHC: 32.3 g/dL (ref 31.5–36.0)
MCV: 89.4 fL (ref 79.5–101.0)
PLATELETS: 245 10*3/uL (ref 145–400)
RBC: 3.57 MIL/uL — ABNORMAL LOW (ref 3.70–5.45)
RDW: 13.3 % (ref 11.2–16.1)
WBC: 6.7 10*3/uL (ref 3.9–10.3)

## 2017-05-02 MED ORDER — SODIUM CHLORIDE 0.9 % IV SOLN
Freq: Once | INTRAVENOUS | Status: AC
  Administered 2017-05-02: 11:00:00 via INTRAVENOUS

## 2017-05-02 MED ORDER — SODIUM CHLORIDE 0.9 % IJ SOLN
10.0000 mL | INTRAMUSCULAR | Status: DC | PRN
  Administered 2017-05-02: 10 mL via INTRAVENOUS
  Filled 2017-05-02: qty 10

## 2017-05-02 MED ORDER — HEPARIN SOD (PORK) LOCK FLUSH 100 UNIT/ML IV SOLN
500.0000 [IU] | Freq: Once | INTRAVENOUS | Status: AC | PRN
  Administered 2017-05-02: 500 [IU]
  Filled 2017-05-02: qty 5

## 2017-05-02 MED ORDER — SODIUM CHLORIDE 0.9% FLUSH
10.0000 mL | INTRAVENOUS | Status: DC | PRN
  Administered 2017-05-02: 10 mL
  Filled 2017-05-02: qty 10

## 2017-05-02 MED ORDER — SODIUM CHLORIDE 0.9 % IV SOLN
240.0000 mg | Freq: Once | INTRAVENOUS | Status: AC
  Administered 2017-05-02: 240 mg via INTRAVENOUS
  Filled 2017-05-02: qty 24

## 2017-05-02 NOTE — Patient Instructions (Signed)
[facility] Discharge Instructions for Patients Receiving Chemotherapy  Today you received the following chemotherapy agents Opdivo  To help prevent nausea and vomiting after your treatment, we encourage you to take your nausea medication as directed   If you develop nausea and vomiting that is not controlled by your nausea medication, call the clinic.   BELOW ARE SYMPTOMS THAT SHOULD BE REPORTED IMMEDIATELY:  *FEVER GREATER THAN 100.5 F  *CHILLS WITH OR WITHOUT FEVER  NAUSEA AND VOMITING THAT IS NOT CONTROLLED WITH YOUR NAUSEA MEDICATION  *UNUSUAL SHORTNESS OF BREATH  *UNUSUAL BRUISING OR BLEEDING  TENDERNESS IN MOUTH AND THROAT WITH OR WITHOUT PRESENCE OF ULCERS  *URINARY PROBLEMS  *BOWEL PROBLEMS  UNUSUAL RASH Items with * indicate a potential emergency and should be followed up as soon as possible.  Feel free to call the clinic should you have any questions or concerns. The clinic phone number is [phone].  Please show the CHEMO ALERT CARD at check-in to the Emergency Department and triage nurse.   

## 2017-05-04 NOTE — Addendum Note (Signed)
Addended by: Tora Kindred on: 05/04/2017 04:00 PM   Modules accepted: Orders

## 2017-05-11 ENCOUNTER — Ambulatory Visit (HOSPITAL_COMMUNITY)
Admission: RE | Admit: 2017-05-11 | Discharge: 2017-05-11 | Disposition: A | Discharge status: Home / Self Care | Payer: Medicaid Other | Source: Ambulatory Visit | Attending: Hematology | Admitting: Hematology

## 2017-05-11 DIAGNOSIS — R590 Localized enlarged lymph nodes: Secondary | ICD-10-CM | POA: Insufficient documentation

## 2017-05-11 DIAGNOSIS — R109 Unspecified abdominal pain: Secondary | ICD-10-CM | POA: Diagnosis not present

## 2017-05-11 DIAGNOSIS — R918 Other nonspecific abnormal finding of lung field: Secondary | ICD-10-CM | POA: Diagnosis not present

## 2017-05-11 DIAGNOSIS — C3491 Malignant neoplasm of unspecified part of right bronchus or lung: Secondary | ICD-10-CM | POA: Insufficient documentation

## 2017-05-11 MED ORDER — IOPAMIDOL (ISOVUE-300) INJECTION 61%
100.0000 mL | Freq: Once | INTRAVENOUS | Status: AC | PRN
  Administered 2017-05-11: 100 mL via INTRAVENOUS

## 2017-05-11 MED ORDER — IOPAMIDOL (ISOVUE-300) INJECTION 61%
INTRAVENOUS | Status: AC
  Filled 2017-05-11: qty 100

## 2017-05-13 NOTE — Progress Notes (Signed)
[name]    HEMATOLOGY/ONCOLOGY CLINIC NOTE  Date of Service: 05/16/2017   Patient Care Team: [name], FNP as PCP - General (Nurse Practitioner)  CHIEF COMPLAINTS/PURPOSE OF CONSULTATION:  F/u for metastatic lung adenocarcinoma  Diagnosis Metastatic Lung Adenocarcinoma with rt flank subcutaneous metastasis.  Current treatment:  Nivolumab q2 weeks  PreviousTreatment -Concurrent Chemo-radiation with Carboplatin/Taxol. Completed RT on 02/13/2016 and has then completed 2 cycles of carboplatin + taxol. -Started Maintenance Durvalumab from  05/24/2016 q2weeks, switched to Nivolumab q2weeks on 12/13/16 once metastatic disease noted -s/p palliative RT to rt sided Subcutaneous metastatic mass.    HISTORY OF PRESENTING ILLNESS: Plz see my previous note for details on initial presentation.  INTERVAL HISTORY   Marilyn [name] presents to the office today for followup of her metastatic lung cancer. The patient's last visit with [place] was on 04/18/17. She is accompanied today by her mother. The pt reports that she is doing well overall. She notes a difficult time staying asleep, waking up thinking of her late son. She notes stable right flank pain that hurts when she lies on that side. She notes neck spasms on the right side of her neck that occur 1-2 times per week that last for about a minute. She denies using any warm compresses. She notes resolving groin rash.   Of note since the patient last visit, pt has had Chest, Abdomen and Pelvis CT completed on 05/11/17 with results revealing 1. Interval development of right axillary, right external iliac and right inguinal adenopathy. Suspicious for nodal metastasis. 2. Decrease in size of right paratracheal adenopathy. 3. Decrease in size and enhancement associated with right lateral body wall lesion. 4. New small nonspecific pulmonary nodule in the left lower lobe measuring 6 mm. 5. Stable appearance of changes secondary to external beam radiation within  the right lung.   Lab results today (05/16/17) of CBC, CMP, and Reticulocytes is as follows: all values are WNL except for RBC at 3.48, Hgb at 10.1, HCT at 30.5, RDW at 14.6, Glucose at 169, Albumin at 3.1, and Total Bilirubin <0.2. TSH 05/16/17 pending.   On review of systems, pt reports eating well, right flank pain, and denies pain or infection in her feet, sleeping through the night, abdominal pains, noticing new lumps or bumps, pelvic pains, leg swelling.    MEDICAL HISTORY:  Past Medical History:  Diagnosis Date  . Arthritis   . Asthma   . Cardiomyopathy ([place])   . CHF (congestive heart failure) ([place])   . Diabetes mellitus    Type II  . Hemorrhoids   . Hypercholesteremia   . Hypertension   . Hypothyroidism   . Lung cancer ([facility])    Lung, Mets to skin- right flank  . 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 Obese   . Pneumonia 2017  . Tachycardia     SURGICAL HISTORY: Past Surgical History:  Procedure Laterality Date  . CESAREAN SECTION    . COLONOSCOPY    . I/D  arm Right 2013  . IR FLUORO GUIDE PORT INSERTION RIGHT  02/18/2017  . IR GENERIC HISTORICAL  01/08/2016   IR US GUIDE VASC ACCESS RIGHT 01/08/2016 [name], DO [facility]  . IR GENERIC HISTORICAL  01/08/2016   IR FLUORO GUIDE CV LINE RIGHT 01/08/2016 [name], DO [facility]  . IR US GUIDE VASC ACCESS RIGHT  02/18/2017  . MULTIPLE EXTRACTIONS WITH ALVEOLOPLASTY N/A 03/25/2017   Procedure: MULTIPLE EXTRACTION WITH ALVEOLOPLASTY (teeth #s four,  six, seven, eight, nine, ten, eleven, one, twenty-three, twenty-four, twenty-five, twenty-six, twenty-nine, thirty);  Surgeon: [name], [name];  Location: [facility];  Service: Oral Surgery;  Laterality: N/A;  . TUBAL LIGATION    . US guided core needle biopsy     of right lower neck/supraclavicular lymph nodes    SOCIAL HISTORY: Social History   Socioeconomic History  . Marital status: Divorced    Spouse name: Not on file  . Number of children: Not on file    . Years of education: Not on file  . Highest education level: Not on file  Social Needs  . Financial resource strain: Not on file  . Food insecurity - worry: Not on file  . Food insecurity - inability: Not on file  . Transportation needs - medical: Not on file  . Transportation needs - non-medical: Not on file  Occupational History  . Not on file  Tobacco Use  . Smoking status: Never Smoker  . Smokeless tobacco: Never Used  Substance and Sexual Activity  . Alcohol use: No  . Drug use: No  . Sexual activity: Not Currently  Other Topics Concern  . Not on file  Social History Narrative   No bird or mold exposure. No recent travel.    FAMILY HISTORY: Family History  Problem Relation Age of Onset  . Heart failure Mother   . Hypertension Mother   . Cancer Neg Hx   . Rheumatologic disease Neg Hx     ALLERGIES:  has No Known Allergies.  MEDICATIONS:  Current Outpatient Medications  Medication Sig Dispense Refill  . aspirin EC 81 MG tablet Take 1 tablet (81 mg total) by mouth daily. 60 tablet 2  . carvedilol (COREG) 6.25 MG tablet Take 1 tablet (6.25 mg total) by mouth 2 (two) times daily. (Patient taking differently: Take 6.25 mg by mouth daily. ) 180 tablet 3  . DULoxetine (CYMBALTA) 60 MG capsule Take 1 capsule (60 mg total) by mouth daily. 30 capsule 2  . insulin aspart (NOVOLOG) 100 UNIT/ML FlexPen As per sliding scale instructions (Patient taking differently: Inject 0-10 Units into the skin 3 (three) times daily. As per sliding scale instructions) 15 mL 2  . Insulin Degludec (TRESIBA FLEXTOUCH) 200 UNIT/ML SOPN Inject 100 Units into the skin daily before breakfast.     . levothyroxine (SYNTHROID, LEVOTHROID) 125 MCG tablet Take 1 tablet (125 mcg total) by mouth daily before breakfast. 30 tablet 2  . lidocaine-prilocaine (EMLA) cream Apply 1 application topically as needed. 30 g 6  . loratadine (CLARITIN) 10 MG tablet Take 10 mg by mouth daily.    . miconazole (MICOTIN) 2 %  powder Apply topically 2 (two) times daily. Apply to area of rash over the perineum 85 g 1  . omeprazole (PRILOSEC) 20 MG capsule Take 1 capsule (20 mg total) by mouth daily. 30 capsule 1  . oxyCODONE-acetaminophen (PERCOCET) 5-325 MG tablet Take 1 tablet by mouth every 6 (six) hours as needed. 30 tablet 0  . polyethylene glycol (MIRALAX) packet Take 17 g by mouth daily. 30 each 1  . senna-docusate (SENNA S) 8.6-50 MG tablet Take 2 tablets by mouth 2 (two) times daily. May reduce to 2 tab po HS as needed once regular BM estabished (Patient taking differently: Take 2 tablets by mouth 2 (two) times daily as needed (FOR CONSTIPATION). =) 60 tablet 1  . losartan (COZAAR) 25 MG tablet Take 1 tablet (25 mg total) by mouth daily. 90 tablet 3  No current facility-administered medications for this visit.    Facility-Administered Medications Ordered in Other Visits  Medication Dose Route Frequency Provider Last Rate Last Dose  . sodium chloride 0.9 % injection 10 mL  10 mL Intravenous PRN [name], MD   10 mL at 05/24/16 1252    REVIEW OF SYSTEMS:    10 Point review of Systems was done is negative except as noted above.  PHYSICAL EXAMINATION: ECOG PERFORMANCE STATUS: 1 - Symptomatic but completely ambulatory  . Vitals:   05/16/17 0907  BP: (!) 135/94  Pulse: (!) 119  Resp: (!) 24  Temp: 97.7 F (36.5 C)  SpO2: 99%   Filed Weights   05/16/17 0907  Weight: 286 lb (129.7 kg)   .Body mass index is 41.63 kg/m.  GENERAL:alert, in no acute distress and comfortable SKIN: radiation related skin injury over neck - healing EYES: normal, conjunctiva are pink and non-injected, sclera clear OROPHARYNX:oral thrush noted NECK: supple, no JVD, thyroid normal size, non-tender, without nodularity LYMPH:  no palpable lymphadenopathy in the cervical, axillary or inguinal.             LUNGS: clear to auscultation with normal respiratory effort. Distant breath sounds  HEART: regular rate &  rhythm,  no murmurs and no lower extremity edema ABDOMEN: abdomen obese, soft, non-tender, normoactive bowel sounds , rt flank subcutaneous mass palpable. Musculoskeletal: no cyanosis of digits and no clubbing  PSYCH: alert & oriented x 3 with fluent speech NEURO: no focal motor/sensory deficits  LABORATORY DATA: .  [name] CBC Latest Ref Rng & Units 05/16/2017 05/02/2017 04/18/2017  WBC 3.9 - 10.3 K/uL 5.2 6.7 5.3  Hemoglobin 11.6 - 15.9 g/dL - 10.3(L) -  Hematocrit 34.8 - 46.6 % 30.5(L) 31.9(L) 30.3(L)  Platelets 145 - 400 K/uL 278 245 229   . CMP Latest Ref Rng & Units 05/02/2017 04/18/2017 04/04/2017  Glucose 70 - 140 mg/dL 294(H) 215(H) 111  BUN 7 - 26 mg/dL 10 9 4.9(L)  Creatinine 0.60 - 1.10 mg/dL 0.89 0.80 0.8  Sodium 136 - 145 mmol/L 137 140 144  Potassium 3.3 - 4.7 mmol/L 4.0 3.6 3.6  Chloride 98 - 109 mmol/L 102 105 -  CO2 22 - 29 mmol/L _0 Calcium 8.4 - 10.4 mg/dL 9.0 8.8 8.8  Total Protein 6.4 - 8.3 g/dL 7.4 6.9 7.1  Total Bilirubin 0.2 - 1.2 mg/dL <0.2(L) <0.2(L) 0.36  Alkaline Phos 40 - 150 U/L 91 86 96  AST 5 - 34 U/L _1 ALT 0 - 55 U/L _2 Component     Latest Ref Rng & Units 03/07/2017  TSH     0.308 - 3.960 m(IU)/L 6.468 (H)  T4,Free(Direct)     0.82 - 1.77 ng/dL 1.08               RADIOGRAPHIC STUDIES:  .Ct Chest W Contrast  Result Date: 05/11/2017 CLINICAL DATA:  Followup lung cancer.  Status post chemo and XRT. EXAM: CT CHEST, ABDOMEN, AND PELVIS WITH CONTRAST TECHNIQUE: Multidetector CT imaging of the chest, abdomen and pelvis was performed following the standard protocol during bolus administration of intravenous contrast. CONTRAST:  130m ISOVUE-300 IOPAMIDOL (ISOVUE-300) INJECTION 61% COMPARISON:  The heart size is normal. CT chest from 01/02/2017. CT CAP from 10/29/2016. FINDINGS: CT CHEST FINDINGS Cardiovascular: Normal heart size. No pericardial effusion identified. Mediastinum/Nodes: The trachea appears patent and is midline.  Normal appearance of the esophagus. Right paratracheal soft tissue mass  measures 2.0 x 2.5 cm, image 21 of series 2. Previously 2.5 x 2.6 cm. No hilar adenopathy. No enlarged supraclavicular lymph nodes. There is a new enlarged right axillary node which measures 2.1 cm, image 20 of series 2. Lungs/Pleura: Trace right pleural effusion. Masslike architectural distortion and fibrosis within the paramediastinal right lung is similar to previous exam. There is a small pulmonary nodule within the posterior left lower lobe which is new from previous exam measuring 6 mm. Musculoskeletal: No suspicious bone lesions. CT ABDOMEN PELVIS FINDINGS Hepatobiliary: Multiple stones are identified within the gallbladder. No biliary dilatation. Pancreas: The pancreas is normal. Spleen: The spleen is normal. Adrenals/Urinary Tract: Normal appearance of both adrenal glands. The kidneys are unremarkable. The urinary bladder is normal. Stomach/Bowel: The stomach is unremarkable. The small bowel loops have a normal course and caliber. No obstruction. The appendix is visualized and appears normal. Normal appearance of the colon. Vascular/Lymphatic: Normal appearance of the abdominal aorta. No enlarged retroperitoneal or mesenteric adenopathy. Enlarged right external iliac lymph node measures 1.6 cm and is new from the previous exam. Large right inguinal nodal mass measures 4.3 by 2.8 cm, image 113 of series 7. Reproductive: Uterine fibroids identified.  No adnexal mass. Other: There is no free fluid or fluid collections within the abdomen or pelvis. The right lateral body wall soft tissue mass identified on study from 10/29/2016 is decreased in size measuring 4.7 x 2.4 cm with decreased enhancement. Previously 5.0 x 4.7 cm. Musculoskeletal: No aggressive lytic or sclerotic bone lesions identified. IMPRESSION: 1. Interval development of right axillary, right external iliac and right inguinal adenopathy. Suspicious for nodal metastasis. 2.  Decrease in size of right paratracheal adenopathy. 3. Decrease in size and enhancement associated with right lateral body wall lesion. 4. New small nonspecific pulmonary nodule in the left lower lobe measuring 6 mm. 5. Stable appearance of changes secondary to external beam radiation within the right lung. Electronically Signed   By: Kerby Moors M.D.   On: 05/11/2017 10:46   Ct Abdomen Pelvis W Contrast  Result Date: 05/11/2017 CLINICAL DATA:  Followup lung cancer.  Status post chemo and XRT. EXAM: CT CHEST, ABDOMEN, AND PELVIS WITH CONTRAST TECHNIQUE: Multidetector CT imaging of the chest, abdomen and pelvis was performed following the standard protocol during bolus administration of intravenous contrast. CONTRAST:  168m ISOVUE-300 IOPAMIDOL (ISOVUE-300) INJECTION 61% COMPARISON:  The heart size is normal. CT chest from 01/02/2017. CT CAP from 10/29/2016. FINDINGS: CT CHEST FINDINGS Cardiovascular: Normal heart size. No pericardial effusion identified. Mediastinum/Nodes: The trachea appears patent and is midline. Normal appearance of the esophagus. Right paratracheal soft tissue mass measures 2.0 x 2.5 cm, image 21 of series 2. Previously 2.5 x 2.6 cm. No hilar adenopathy. No enlarged supraclavicular lymph nodes. There is a new enlarged right axillary node which measures 2.1 cm, image 20 of series 2. Lungs/Pleura: Trace right pleural effusion. Masslike architectural distortion and fibrosis within the paramediastinal right lung is similar to previous exam. There is a small pulmonary nodule within the posterior left lower lobe which is new from previous exam measuring 6 mm. Musculoskeletal: No suspicious bone lesions. CT ABDOMEN PELVIS FINDINGS Hepatobiliary: Multiple stones are identified within the gallbladder. No biliary dilatation. Pancreas: The pancreas is normal. Spleen: The spleen is normal. Adrenals/Urinary Tract: Normal appearance of both adrenal glands. The kidneys are unremarkable. The urinary  bladder is normal. Stomach/Bowel: The stomach is unremarkable. The small bowel loops have a normal course and caliber. No obstruction. The appendix is visualized  and appears normal. Normal appearance of the colon. Vascular/Lymphatic: Normal appearance of the abdominal aorta. No enlarged retroperitoneal or mesenteric adenopathy. Enlarged right external iliac lymph node measures 1.6 cm and is new from the previous exam. Large right inguinal nodal mass measures 4.3 by 2.8 cm, image 113 of series 7. Reproductive: Uterine fibroids identified.  No adnexal mass. Other: There is no free fluid or fluid collections within the abdomen or pelvis. The right lateral body wall soft tissue mass identified on study from 10/29/2016 is decreased in size measuring 4.7 x 2.4 cm with decreased enhancement. Previously 5.0 x 4.7 cm. Musculoskeletal: No aggressive lytic or sclerotic bone lesions identified. IMPRESSION: 1. Interval development of right axillary, right external iliac and right inguinal adenopathy. Suspicious for nodal metastasis. 2. Decrease in size of right paratracheal adenopathy. 3. Decrease in size and enhancement associated with right lateral body wall lesion. 4. New small nonspecific pulmonary nodule in the left lower lobe measuring 6 mm. 5. Stable appearance of changes secondary to external beam radiation within the right lung. Electronically Signed   By: Kerby Moors M.D.   On: 05/11/2017 10:46     ASSESSMENT & PLAN:   52 y.o.  African-American female with history of hypertension, diabetes, asthma, dysuria mass and morbid obesity with    #1 h/o Lung Adenocarcinoma Rt sided atleast Stage IIIB (on diagnosis) with large right paratracheal mass with mediastinal adenopathy that appears to have grown significantly over the last 6-7 months and rt supraclavicular LN +.  -Noted to have a small mass in the left adrenal on CT but PET/CT neg for metastatic disease. Patient has been a lifelong nonsmoker -MRI of the  brain was negative for any metastatic disease. At diagnosis. -High PDL1 expression (90%) on [facility] Neg for EGFR, ALK, ROS-1 and BRAF mutations. -Patient completed her planned definitive chemoradiation with carbo Taxol on 02/13/2016. No prohibitive toxicities other than some grade 1 skin desquamation and some grade 1-2 radiation esophagitis. She has subsequently received 2 out of 2 planned dose of carboplatin + Taxol. -Patient has completed 11 doses of maintenance   -CTA chest 10/29/2016  no evidence of lung cancer progression in the chest. No PE -CT abd/pelvis-10/29/2016  Interval 5.0 x 5.0 x 4.7 cm mass in the right lateral subcutaneous fat at the level of the mid abdomen. This has CT features compatible with a metastasis or primary neoplasm. -Continue IV Nivolumab q2weeks - plz schedule next 4 doses Labs q2weeks RTC with Dr Irene Limbo in 4 weeks. CT C/A/P (05/12/2017): Interval development of right axillary, right external iliac and right inguinal adenopathy. Suspicious for nodal metastasis. 2. Decrease in size of right paratracheal adenopathy. 3. Decrease in size and enhancement associated with right lateral body wall lesion. 4. New small nonspecific pulmonary nodule in the left lower lobe measuring 6 mm. 5. Stable appearance of changes secondary to external beam radiation within the right lung    #2 Now with Metastatic lung adenocarcinoma with isolated metastases in the right lateral subcutaneous fat at the level of the midabdomen. she received maintenance Durvalumab from  05/24/2016 q2weeks, switched to Nivolumab q2weeks on 12/13/16 in the setting of Biopsy proven isolated metastatic disease. Given the tumor is strongly PDL1 positive and patient has only isolated metastatic disease was treated with palliative RT to metastases with significant improvement.  CT  Plan -No prohibitive toxicities from Nivolumab. -continue Nivolumab at this time in the absence of overt disease progression or  prohibitive toxicities. - Continue when necessary Percocet for  pain management  -Discussed pt labwork today; blood chemistries are stable. -Discussed most recent 05/11/17 CT scan results in details; development of right axillary, right external iliac, and right inguinal adenopathy suspicious for nodal metastasis. Also decreased size and enhancement of right lateral body wall lesion. Lung stable.  -Discussed radiation treatment to her lymph node progression and accompanying symptoms while continuing immunotherapy; she notes her inguinal region is not tender nor painful right now.  -continue Nivolumab at this time.in the absence of symptomatic progression. Will need to consider palliative RT to symptomatic lesions. -if broader progression might ned to consider palliative chemotherapy   #3 h/o radiation pneumonitis - no new symptoms. Does has some cough with exertion but this has improved.  #4 Improving fatigue (grade 1)  -completed outpatient PT program - optimize DM2 management with PCP. Reported HgA1c 10. - improved fatigue with levothyroxine compliance with near normalization of FT4 levels.  - Continue Levolthyroxine 168mg ---will likely need larger dose but in the setting of baseline tachycardia we are gradually increasing dose to avoid ppt cardiac arrhythmias.   #5 normocytic anemia related to chemotherapy -resolving and stable Hg at 9.5  #6 Left chest wall pain due to costochondritis - mx with pain medications. CTA x 2 neg for PE. No significant pain at this time.  #7 Sinus tachycardia - on BB per cards , no PE on CTA x 2 , hgb 10.1 -Still having sinus tachycardia . Partly from deconditioning and anemia. Seeing Dr. HDebara Pickettfrom cardiology  -Continues to be on Coreg  #8 neoplasm related pain is currently controlled without significant medications. Clinically likely has sleep apnea . Would need to be careful with sedative medications . 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 Gave her topical and oral antifungals to help with this.  -She is still on fluconazole  #12 Insomnia related to son's recent demise -Discussed pt sleep difficulties, offered trazodone which she agreed would be helpful.    -continue Nivolumab q2weeks . plz schedule 4 cycles -labs q2weeks -RTC with Dr KaIrene Limbon 4 weeks  All of the patients questions were answered with apparent satisfaction. The patient knows to call the clinic with any problems, questions or concerns.  . The total time spent in the appointment was 25 minutes and more than 50% was on counseling and direct patient cares.     GaSullivan LoneD MSRivertonAHIVMS SCGastrodiagnostics A Medical Group Dba United Surgery Center OrangeTPresence Saint Joseph Hospitalematology/Oncology Physician CoBushyhead(Office):       [phone] cell):  [phone]):           [phone] This document serves as a record of services personally performed by GaSullivan LoneMD. It was created on his behalf by ScBaldwin Jamaicaa trained medical scribe. The creation of this record is based on the scribe's personal observations and the provider's statements to them.   .I have reviewed the above documentation for accuracy and completeness, and I agree with the above. .GBrunetta GeneraD MS

## 2017-05-16 ENCOUNTER — Inpatient Hospital Stay: Payer: Medicaid Other | Attending: Hematology

## 2017-05-16 ENCOUNTER — Inpatient Hospital Stay: Payer: Medicaid Other

## 2017-05-16 ENCOUNTER — Inpatient Hospital Stay (HOSPITAL_BASED_OUTPATIENT_CLINIC_OR_DEPARTMENT_OTHER): Payer: Medicaid Other | Admitting: Hematology

## 2017-05-16 ENCOUNTER — Encounter: Payer: Self-pay | Admitting: Hematology

## 2017-05-16 VITALS — BP 135/94 | HR 119 | Temp 97.7°F | Resp 24 | Ht 69.5 in | Wt 286.0 lb

## 2017-05-16 DIAGNOSIS — E039 Hypothyroidism, unspecified: Secondary | ICD-10-CM | POA: Diagnosis not present

## 2017-05-16 DIAGNOSIS — R000 Tachycardia, unspecified: Secondary | ICD-10-CM | POA: Insufficient documentation

## 2017-05-16 DIAGNOSIS — C3491 Malignant neoplasm of unspecified part of right bronchus or lung: Secondary | ICD-10-CM | POA: Diagnosis not present

## 2017-05-16 DIAGNOSIS — C7989 Secondary malignant neoplasm of other specified sites: Secondary | ICD-10-CM

## 2017-05-16 DIAGNOSIS — E1140 Type 2 diabetes mellitus with diabetic neuropathy, unspecified: Secondary | ICD-10-CM

## 2017-05-16 DIAGNOSIS — J45909 Unspecified asthma, uncomplicated: Secondary | ICD-10-CM

## 2017-05-16 DIAGNOSIS — M542 Cervicalgia: Secondary | ICD-10-CM | POA: Insufficient documentation

## 2017-05-16 DIAGNOSIS — R21 Rash and other nonspecific skin eruption: Secondary | ICD-10-CM

## 2017-05-16 DIAGNOSIS — R918 Other nonspecific abnormal finding of lung field: Secondary | ICD-10-CM

## 2017-05-16 DIAGNOSIS — Z923 Personal history of irradiation: Secondary | ICD-10-CM | POA: Insufficient documentation

## 2017-05-16 DIAGNOSIS — Z9221 Personal history of antineoplastic chemotherapy: Secondary | ICD-10-CM

## 2017-05-16 DIAGNOSIS — R599 Enlarged lymph nodes, unspecified: Secondary | ICD-10-CM

## 2017-05-16 DIAGNOSIS — Z7982 Long term (current) use of aspirin: Secondary | ICD-10-CM | POA: Insufficient documentation

## 2017-05-16 DIAGNOSIS — Z5112 Encounter for antineoplastic immunotherapy: Secondary | ICD-10-CM | POA: Diagnosis present

## 2017-05-16 DIAGNOSIS — Z79899 Other long term (current) drug therapy: Secondary | ICD-10-CM | POA: Insufficient documentation

## 2017-05-16 DIAGNOSIS — I10 Essential (primary) hypertension: Secondary | ICD-10-CM | POA: Insufficient documentation

## 2017-05-16 DIAGNOSIS — G4700 Insomnia, unspecified: Secondary | ICD-10-CM

## 2017-05-16 DIAGNOSIS — F17210 Nicotine dependence, cigarettes, uncomplicated: Secondary | ICD-10-CM

## 2017-05-16 DIAGNOSIS — Z8701 Personal history of pneumonia (recurrent): Secondary | ICD-10-CM | POA: Insufficient documentation

## 2017-05-16 DIAGNOSIS — Z23 Encounter for immunization: Secondary | ICD-10-CM | POA: Diagnosis not present

## 2017-05-16 DIAGNOSIS — D259 Leiomyoma of uterus, unspecified: Secondary | ICD-10-CM | POA: Insufficient documentation

## 2017-05-16 DIAGNOSIS — E669 Obesity, unspecified: Secondary | ICD-10-CM

## 2017-05-16 DIAGNOSIS — E1165 Type 2 diabetes mellitus with hyperglycemia: Secondary | ICD-10-CM | POA: Diagnosis not present

## 2017-05-16 DIAGNOSIS — C778 Secondary and unspecified malignant neoplasm of lymph nodes of multiple regions: Secondary | ICD-10-CM

## 2017-05-16 DIAGNOSIS — D6481 Anemia due to antineoplastic chemotherapy: Secondary | ICD-10-CM | POA: Diagnosis not present

## 2017-05-16 DIAGNOSIS — E7800 Pure hypercholesterolemia, unspecified: Secondary | ICD-10-CM | POA: Insufficient documentation

## 2017-05-16 DIAGNOSIS — C792 Secondary malignant neoplasm of skin: Secondary | ICD-10-CM

## 2017-05-16 DIAGNOSIS — M129 Arthropathy, unspecified: Secondary | ICD-10-CM | POA: Diagnosis not present

## 2017-05-16 DIAGNOSIS — I509 Heart failure, unspecified: Secondary | ICD-10-CM

## 2017-05-16 DIAGNOSIS — R300 Dysuria: Secondary | ICD-10-CM | POA: Insufficient documentation

## 2017-05-16 DIAGNOSIS — E785 Hyperlipidemia, unspecified: Secondary | ICD-10-CM

## 2017-05-16 DIAGNOSIS — Z794 Long term (current) use of insulin: Secondary | ICD-10-CM | POA: Insufficient documentation

## 2017-05-16 DIAGNOSIS — G893 Neoplasm related pain (acute) (chronic): Secondary | ICD-10-CM

## 2017-05-16 DIAGNOSIS — Z452 Encounter for adjustment and management of vascular access device: Secondary | ICD-10-CM

## 2017-05-16 DIAGNOSIS — Z95828 Presence of other vascular implants and grafts: Secondary | ICD-10-CM

## 2017-05-16 LAB — CBC WITH DIFFERENTIAL (CANCER CENTER ONLY)
Basophils Absolute: 0 10*3/uL (ref 0.0–0.1)
Basophils Relative: 1 %
EOS ABS: 0.3 10*3/uL (ref 0.0–0.5)
Eosinophils Relative: 6 %
HCT: 30.5 % — ABNORMAL LOW (ref 34.8–46.6)
Hemoglobin: 10.1 g/dL — ABNORMAL LOW (ref 11.6–15.9)
LYMPHS ABS: 1.1 10*3/uL (ref 0.9–3.3)
Lymphocytes Relative: 21 %
MCH: 28.9 pg (ref 25.1–34.0)
MCHC: 33 g/dL (ref 31.5–36.0)
MCV: 87.5 fL (ref 79.5–101.0)
MONOS PCT: 5 %
Monocytes Absolute: 0.2 10*3/uL (ref 0.1–0.9)
Neutro Abs: 3.5 10*3/uL (ref 1.5–6.5)
Neutrophils Relative %: 67 %
Platelet Count: 278 10*3/uL (ref 145–400)
RBC: 3.48 MIL/uL — AB (ref 3.70–5.45)
RDW: 14.6 % — ABNORMAL HIGH (ref 11.2–14.5)
WBC: 5.2 10*3/uL (ref 3.9–10.3)

## 2017-05-16 LAB — COMPREHENSIVE METABOLIC PANEL
ALT: 9 U/L (ref 0–55)
AST: 19 U/L (ref 5–34)
Albumin: 3.1 g/dL — ABNORMAL LOW (ref 3.5–5.0)
Alkaline Phosphatase: 79 U/L (ref 40–150)
Anion gap: 9 (ref 3–11)
BUN: 9 mg/dL (ref 7–26)
CALCIUM: 8.9 mg/dL (ref 8.4–10.4)
CO2: 25 mmol/L (ref 22–29)
CREATININE: 0.91 mg/dL (ref 0.60–1.10)
Chloride: 106 mmol/L (ref 98–109)
GFR calc non Af Amer: >60 mL/min (ref >60)
Glucose, Bld: 169 mg/dL — ABNORMAL HIGH (ref 70–140)
Potassium: 3.9 mmol/L (ref 3.5–5.1)
SODIUM: 140 mmol/L (ref 136–145)
Total Bilirubin: <0.2 mg/dL — ABNORMAL LOW (ref 0.2–1.2)
Total Protein: 7.3 g/dL (ref 6.4–8.3)

## 2017-05-16 LAB — RETICULOCYTES
RBC.: 3.48 MIL/uL — ABNORMAL LOW (ref 3.70–5.45)
RETIC CT PCT: 1.8 % (ref 0.7–2.1)
Retic Count, Absolute: 62.6 10*3/uL (ref 33.7–90.7)

## 2017-05-16 LAB — TSH: TSH: 46.1 u[IU]/mL — ABNORMAL HIGH (ref 0.308–3.960)

## 2017-05-16 MED ORDER — HEPARIN SOD (PORK) LOCK FLUSH 100 UNIT/ML IV SOLN
500.0000 [IU] | Freq: Once | INTRAVENOUS | Status: AC | PRN
  Administered 2017-05-16: 500 [IU]
  Filled 2017-05-16: qty 5

## 2017-05-16 MED ORDER — SODIUM CHLORIDE 0.9 % IJ SOLN
10.0000 mL | INTRAMUSCULAR | Status: DC | PRN
  Administered 2017-05-16: 10 mL via INTRAVENOUS
  Filled 2017-05-16: qty 10

## 2017-05-16 MED ORDER — SODIUM CHLORIDE 0.9 % IV SOLN
240.0000 mg | Freq: Once | INTRAVENOUS | Status: AC
  Administered 2017-05-16: 240 mg via INTRAVENOUS
  Filled 2017-05-16: qty 24

## 2017-05-16 MED ORDER — TRAZODONE HCL 50 MG PO TABS: 50.0000 mg | ORAL_TABLET | Freq: Every evening | ORAL | 0 refills | Status: DC | PRN

## 2017-05-16 MED ORDER — SODIUM CHLORIDE 0.9 % IV SOLN
Freq: Once | INTRAVENOUS | Status: AC
  Administered 2017-05-16: 11:00:00 via INTRAVENOUS

## 2017-05-16 MED ORDER — PNEUMOCOCCAL VAC POLYVALENT 25 MCG/0.5ML IJ INJ
0.5000 mL | INJECTION | Freq: Once | INTRAMUSCULAR | Status: AC
  Administered 2017-05-16: 0.5 mL via INTRAMUSCULAR
  Filled 2017-05-16: qty 0.5

## 2017-05-16 MED ORDER — SODIUM CHLORIDE 0.9% FLUSH
10.0000 mL | INTRAVENOUS | Status: DC | PRN
  Administered 2017-05-16: 10 mL
  Filled 2017-05-16: qty 10

## 2017-05-16 NOTE — Patient Instructions (Signed)
Cancer Center Discharge Instructions for Patients Receiving Chemotherapy  Today you received the following chemotherapy agents:  Nivolumab  To help prevent nausea and vomiting after your treatment, we encourage you to take your nausea medication as prescribed.   If you develop nausea and vomiting that is not controlled by your nausea medication, call the clinic.   BELOW ARE SYMPTOMS THAT SHOULD BE REPORTED IMMEDIATELY:  *FEVER GREATER THAN 100.5 F  *CHILLS WITH OR WITHOUT FEVER  NAUSEA AND VOMITING THAT IS NOT CONTROLLED WITH YOUR NAUSEA MEDICATION  *UNUSUAL SHORTNESS OF BREATH  *UNUSUAL BRUISING OR BLEEDING  TENDERNESS IN MOUTH AND THROAT WITH OR WITHOUT PRESENCE OF ULCERS  *URINARY PROBLEMS  *BOWEL PROBLEMS  UNUSUAL RASH Items with * indicate a potential emergency and should be followed up as soon as possible.  Feel free to call the clinic should you have any questions or concerns. The clinic phone number is [phone].  Please show the CHEMO ALERT CARD at check-in to the Emergency Department and triage nurse.   

## 2017-05-17 ENCOUNTER — Telehealth: Payer: Self-pay

## 2017-05-17 NOTE — Telephone Encounter (Signed)
Called patient with added appointments.per 2/11 los

## 2017-05-30 ENCOUNTER — Inpatient Hospital Stay: Payer: Medicaid Other

## 2017-05-30 VITALS — BP 135/71 | HR 117 | Temp 97.9°F | Resp 20

## 2017-05-30 DIAGNOSIS — Z5112 Encounter for antineoplastic immunotherapy: Secondary | ICD-10-CM | POA: Diagnosis not present

## 2017-05-30 DIAGNOSIS — C792 Secondary malignant neoplasm of skin: Secondary | ICD-10-CM

## 2017-05-30 DIAGNOSIS — C3491 Malignant neoplasm of unspecified part of right bronchus or lung: Secondary | ICD-10-CM

## 2017-05-30 LAB — CBC WITH DIFFERENTIAL (CANCER CENTER ONLY)
BASOS ABS: 0 10*3/uL (ref 0.0–0.1)
BASOS PCT: 0 %
EOS ABS: 0.3 10*3/uL (ref 0.0–0.5)
Eosinophils Relative: 7 %
HCT: 29.7 % — ABNORMAL LOW (ref 34.8–46.6)
HEMOGLOBIN: 9.3 g/dL — AB (ref 11.6–15.9)
Lymphocytes Relative: 24 %
Lymphs Abs: 1.2 10*3/uL (ref 0.9–3.3)
MCH: 28.5 pg (ref 25.1–34.0)
MCHC: 31.3 g/dL — ABNORMAL LOW (ref 31.5–36.0)
MCV: 91.1 fL (ref 79.5–101.0)
Monocytes Absolute: 0.3 10*3/uL (ref 0.1–0.9)
Monocytes Relative: 5 %
Neutro Abs: 3 10*3/uL (ref 1.5–6.5)
Neutrophils Relative %: 64 %
Platelet Count: 266 10*3/uL (ref 145–400)
RBC: 3.26 MIL/uL — AB (ref 3.70–5.45)
RDW: 14.2 % (ref 11.2–14.5)
WBC: 4.8 10*3/uL (ref 3.9–10.3)

## 2017-05-30 LAB — COMPREHENSIVE METABOLIC PANEL
ALK PHOS: 74 U/L (ref 40–150)
ALT: 13 U/L (ref 0–55)
ANION GAP: 9 (ref 3–11)
AST: 23 U/L (ref 5–34)
Albumin: 3.1 g/dL — ABNORMAL LOW (ref 3.5–5.0)
BUN: 7 mg/dL (ref 7–26)
CALCIUM: 8.7 mg/dL (ref 8.4–10.4)
CO2: 26 mmol/L (ref 22–29)
CREATININE: 0.96 mg/dL (ref 0.60–1.10)
Chloride: 104 mmol/L (ref 98–109)
GFR calc Af Amer: >60 mL/min (ref >60)
Glucose, Bld: 246 mg/dL — ABNORMAL HIGH (ref 70–140)
Potassium: 3.8 mmol/L (ref 3.5–5.1)
Sodium: 139 mmol/L (ref 136–145)
Total Bilirubin: 0.3 mg/dL (ref 0.2–1.2)
Total Protein: 7.1 g/dL (ref 6.4–8.3)

## 2017-05-30 LAB — RETICULOCYTES
RBC.: 3.26 MIL/uL — AB (ref 3.70–5.45)
RETIC COUNT ABSOLUTE: 55.4 10*3/uL (ref 33.7–90.7)
RETIC CT PCT: 1.7 % (ref 0.7–2.1)

## 2017-05-30 MED ORDER — NIVOLUMAB CHEMO INJECTION 100 MG/10ML
240.0000 mg | Freq: Once | INTRAVENOUS | Status: AC
  Administered 2017-05-30: 240 mg via INTRAVENOUS
  Filled 2017-05-30: qty 24

## 2017-05-30 MED ORDER — SODIUM CHLORIDE 0.9% FLUSH
10.0000 mL | INTRAVENOUS | Status: DC | PRN
  Administered 2017-05-30: 10 mL
  Filled 2017-05-30: qty 10

## 2017-05-30 MED ORDER — SODIUM CHLORIDE 0.9 % IV SOLN
Freq: Once | INTRAVENOUS | Status: AC
  Administered 2017-05-30: 11:00:00 via INTRAVENOUS

## 2017-05-30 MED ORDER — HEPARIN SOD (PORK) LOCK FLUSH 100 UNIT/ML IV SOLN
500.0000 [IU] | Freq: Once | INTRAVENOUS | Status: AC | PRN
  Administered 2017-05-30: 500 [IU]
  Filled 2017-05-30: qty 5

## 2017-05-30 NOTE — Patient Instructions (Signed)
[facility] Discharge Instructions for Patients Receiving Chemotherapy  Today you received the following chemotherapy agent:  Nivolumab.  To help prevent nausea and vomiting after your treatment, we encourage you to take your nausea medication as prescribed.   If you develop nausea and vomiting that is not controlled by your nausea medication, call the clinic.   BELOW ARE SYMPTOMS THAT SHOULD BE REPORTED IMMEDIATELY:  *FEVER GREATER THAN 100.5 F  *CHILLS WITH OR WITHOUT FEVER  NAUSEA AND VOMITING THAT IS NOT CONTROLLED WITH YOUR NAUSEA MEDICATION  *UNUSUAL SHORTNESS OF BREATH  *UNUSUAL BRUISING OR BLEEDING  TENDERNESS IN MOUTH AND THROAT WITH OR WITHOUT PRESENCE OF ULCERS  *URINARY PROBLEMS  *BOWEL PROBLEMS  UNUSUAL RASH Items with * indicate a potential emergency and should be followed up as soon as possible.  Feel free to call the clinic should you have any questions or concerns. The clinic phone number is [phone].  Please show the [place] at check-in to the Emergency Department and triage nurse.

## 2017-05-31 IMAGING — CR DG CHEST 2V
2 series · 2 of 2 positions shown · non-contrast
Comparison: 05/14/2016

CLINICAL DATA: PT C/O ANTERIOR CP AND SOB X 2 DAYS HX BREAST CA
W/CHEMO

EXAM:
CHEST  2 VIEW

[w chest lat]
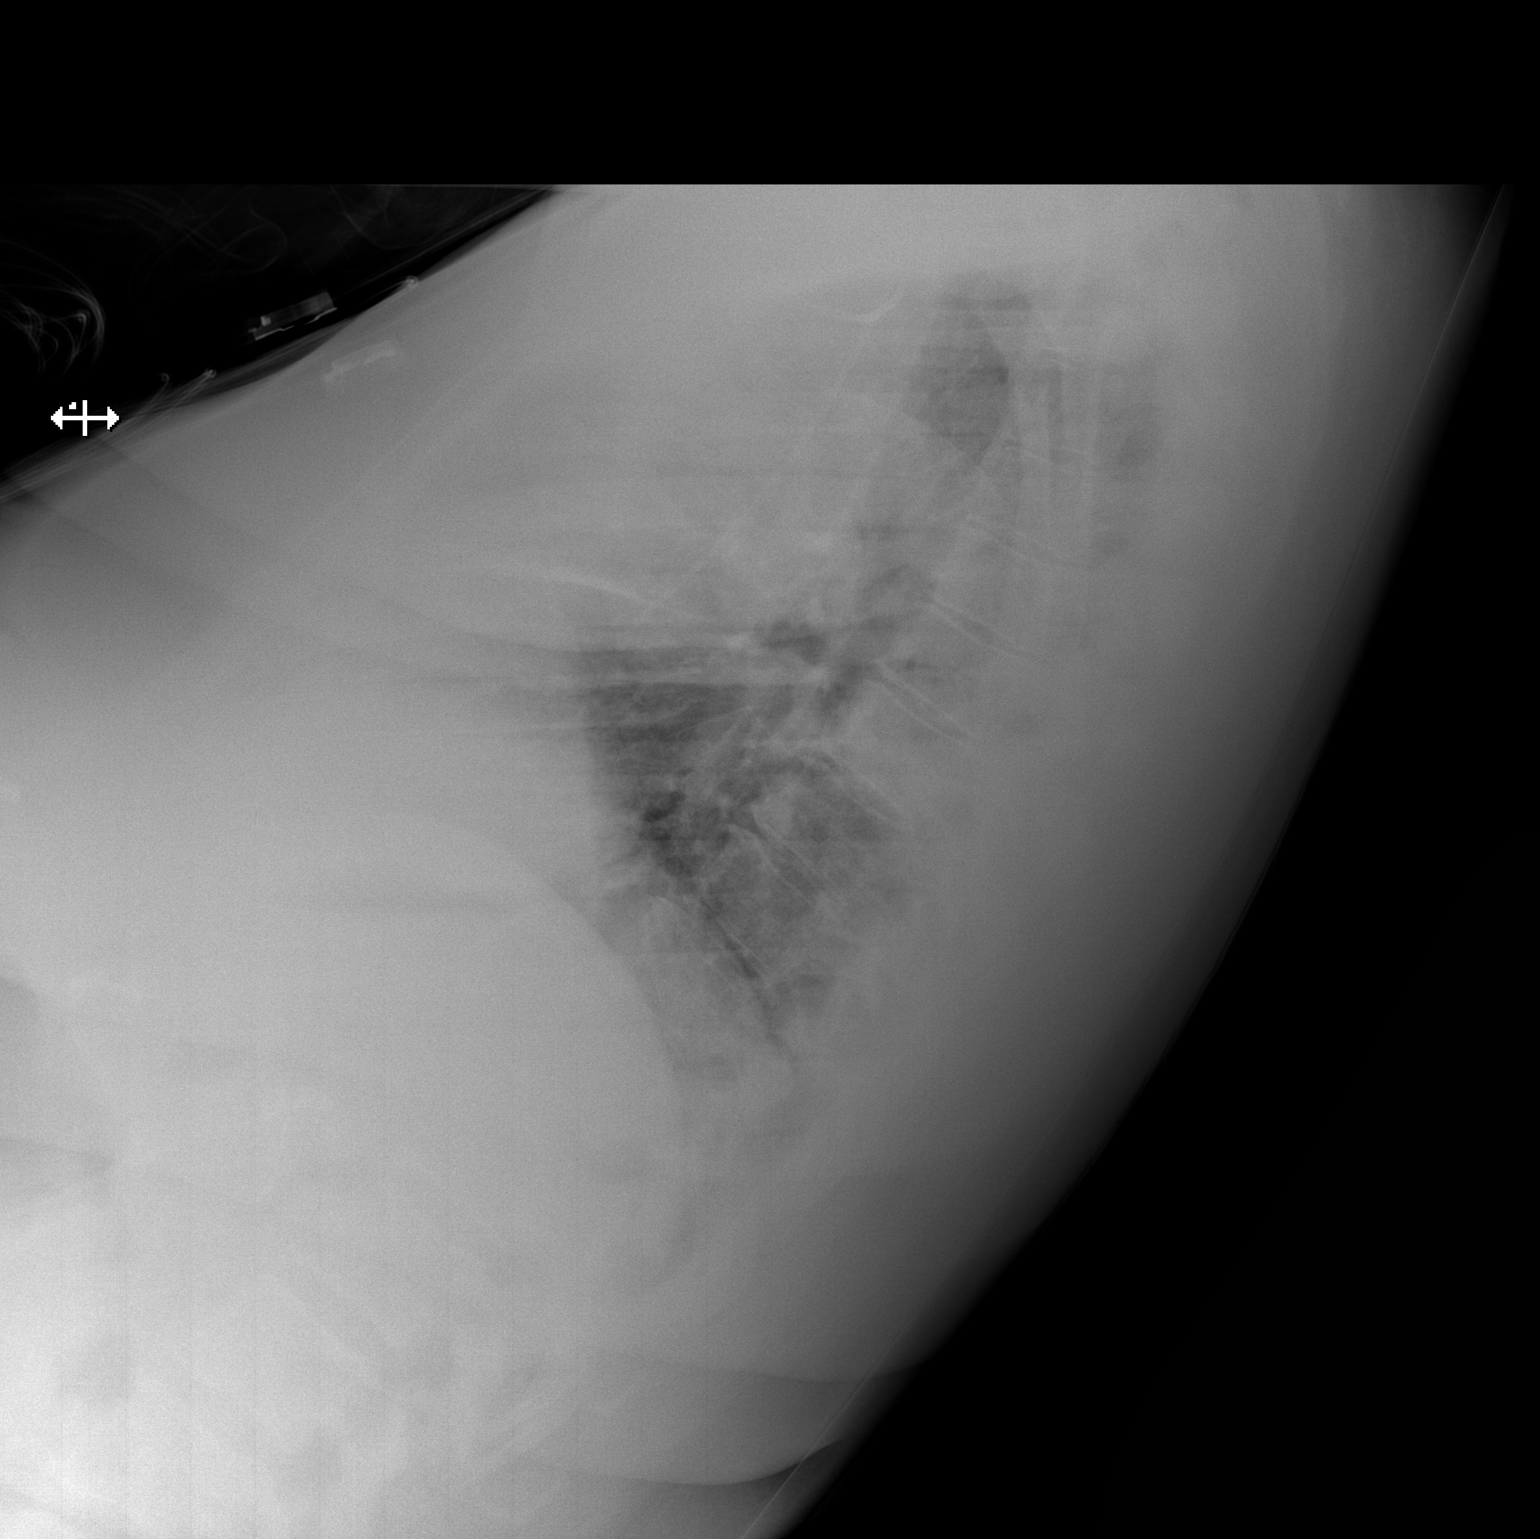

[x chest ap]
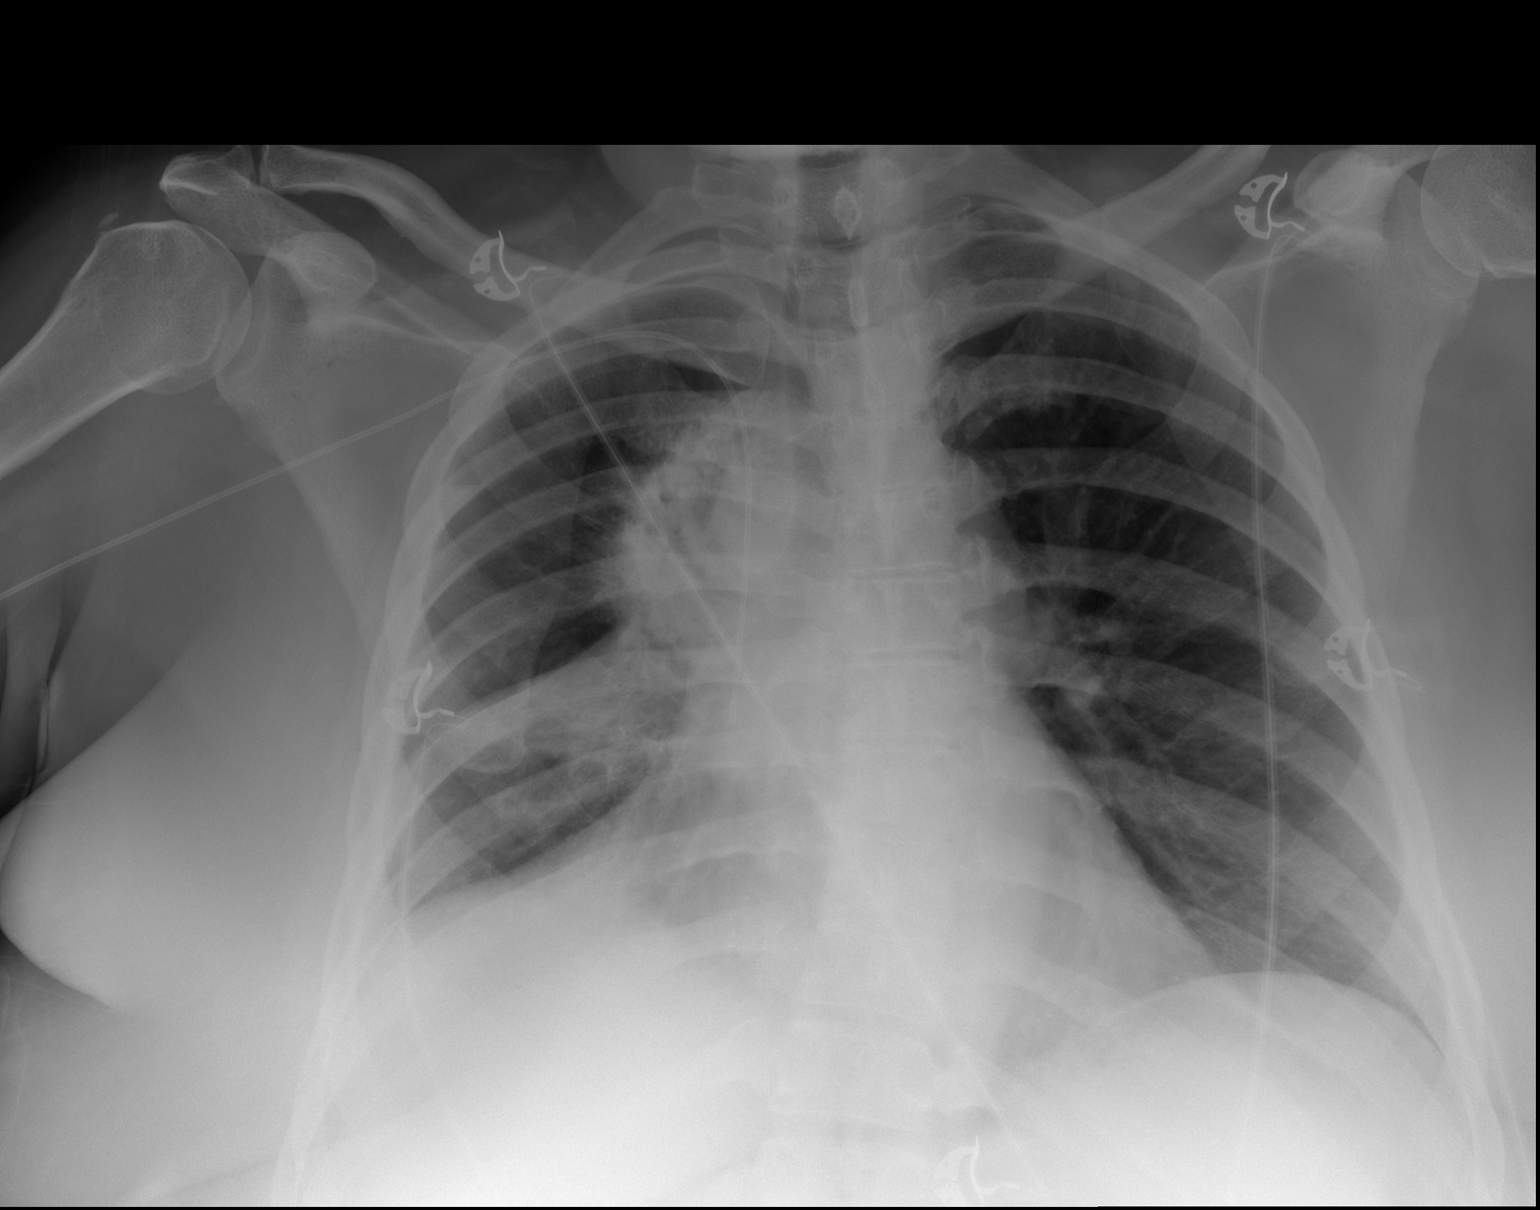

[2 of 2 positions shown; findings below may reference images not displayed]

FINDINGS: There is new opacity in the right lower lung consistent with right
middle lobe atelectasis. Remainder of the lungs is clear. There is
right peritracheal mediastinal thickening and masslike soft tissue
fullness extending superior to the right hilum, which appears
increased when compared to the prior chest radiograph.

Cardiac silhouette is normal in size. The left hilar contours are
normal.

No pleural effusion.  No pneumothorax.

Right PICC is well positioned, tip at the caval atrial junction.
IMPRESSION: 1. New right lower lung opacity consistent with right middle lobe
atelectasis. A component of postobstructive consolidation or
pneumonia is possible. Remainder of the lungs is clear.
2. Right peritracheal and superior hilar mass increased when
compared to the prior chest radiograph. Consider further assessment
of these findings with chest CT with contrast.

## 2017-06-02 IMAGING — CT CT ANGIO CHEST
1 of 7 series · 18 of 36 positions shown · IV contrast (isovue)
Comparison: Chest x-ray dated 07/03/2016 and CT scan dated
05/14/2016

CLINICAL DATA: Left-sided chest pain with shortness of breath and
cough since 07/01/2016. Non-small cell lung cancer diagnosed in
December 2015.

EXAM:
CT ANGIOGRAPHY CHEST WITH CONTRAST
TECHNIQUE: Multidetector CT imaging of the chest was performed using the
standard protocol during bolus administration of intravenous
contrast. Multiplanar CT image reconstructions and MIPs were
obtained to evaluate the vascular anatomy.
CONTRAST:  66 cc Isovue 370

[Series 5: thins · axial · 0.72mm/px · z∈[-290,-23]mm · 18 of 299 slices shown]
[im 16/299  lung]
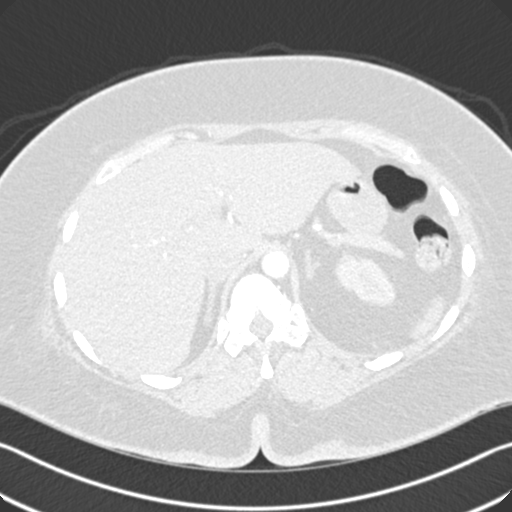
[im 32/299  mediastinal]
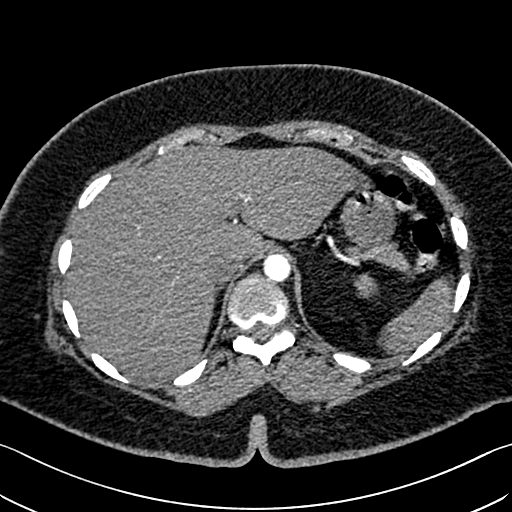
[im 48/299  lung]
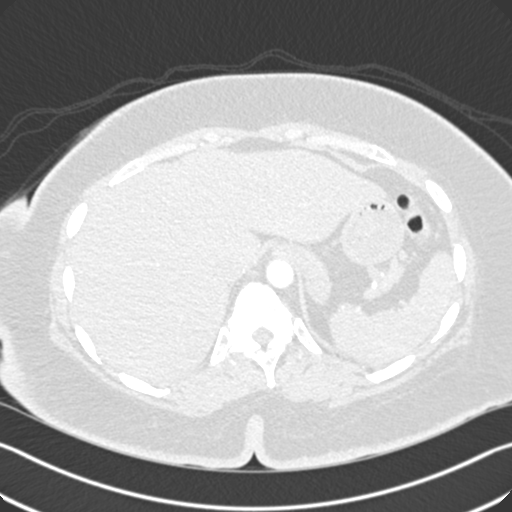
[im 63/299  mediastinal]
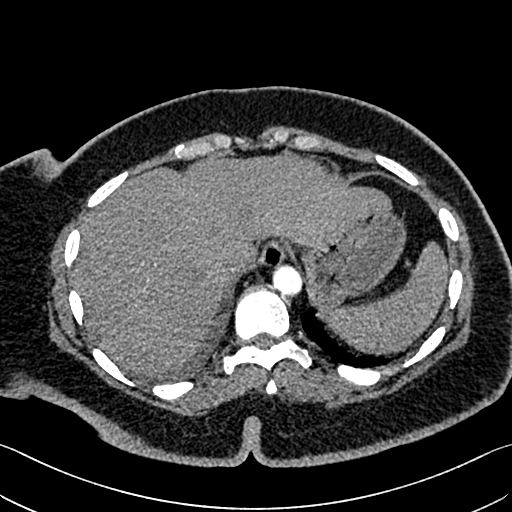
[im 79/299  lung]
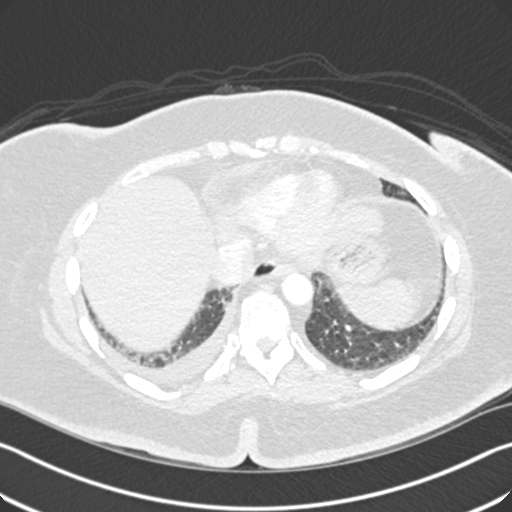
[im 95/299  mediastinal]
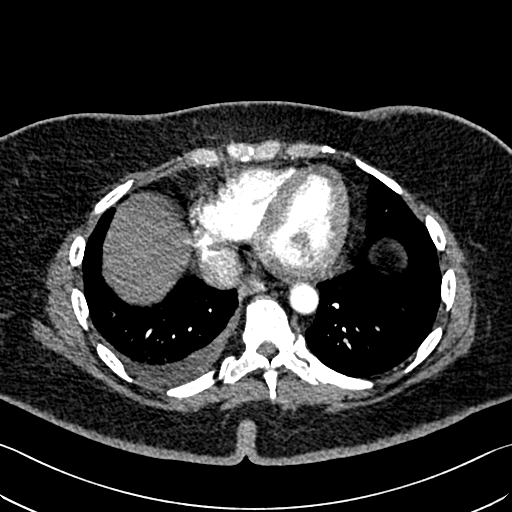
[im 110/299  lung]
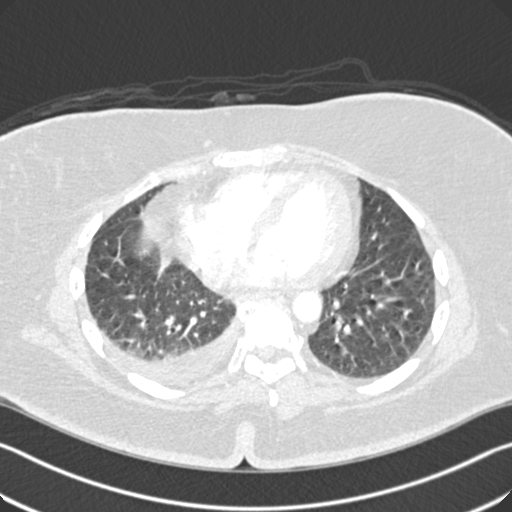
[im 126/299  mediastinal]
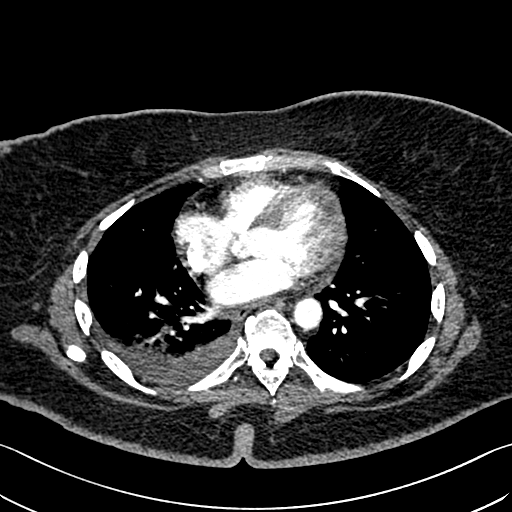
[im 142/299  lung]
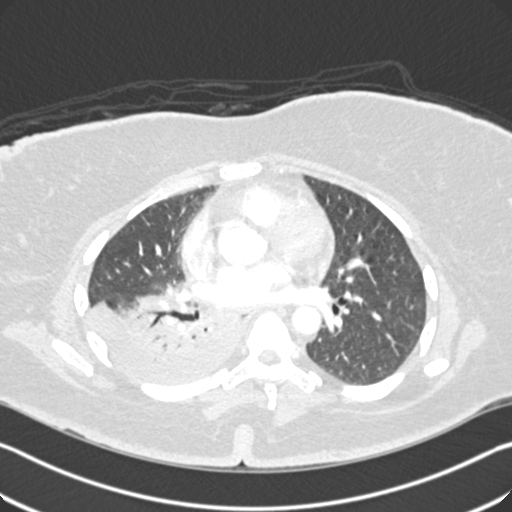
[im 157/299  mediastinal]
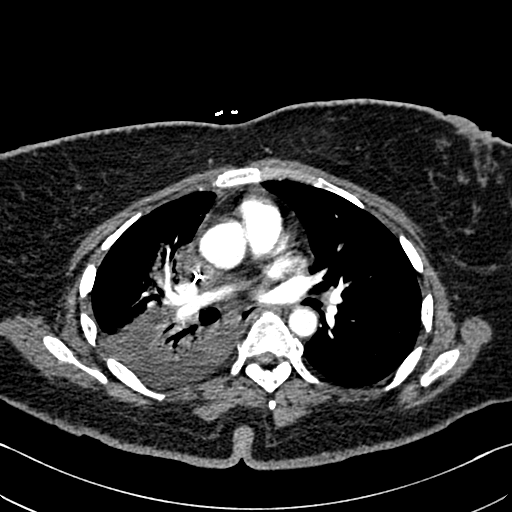
[im 173/299  lung]
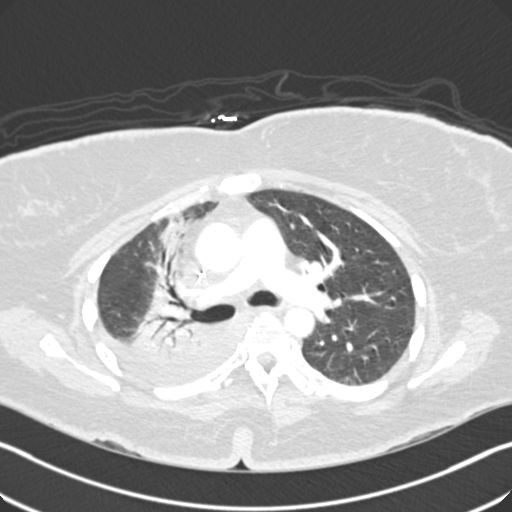
[im 189/299  mediastinal]
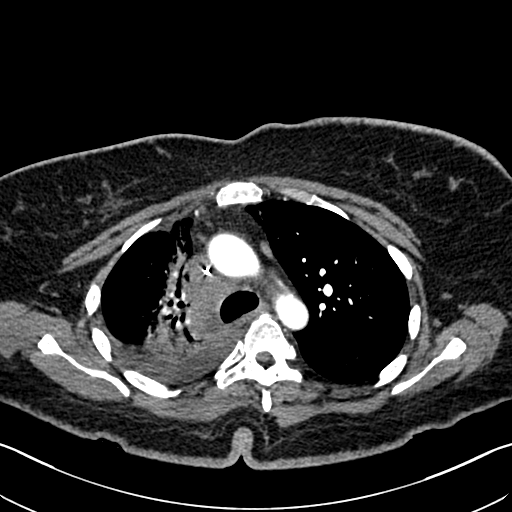
[im 204/299  lung]
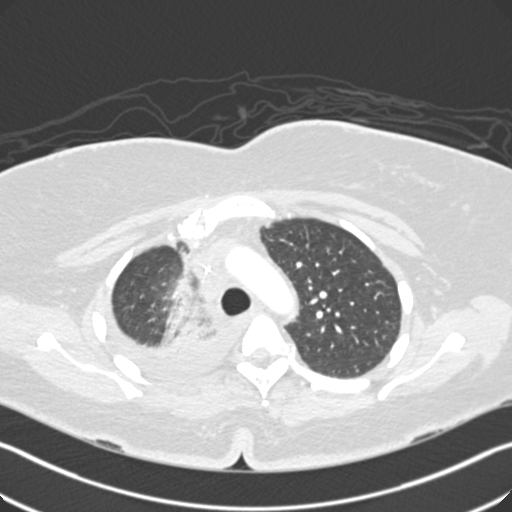
[im 220/299  mediastinal]
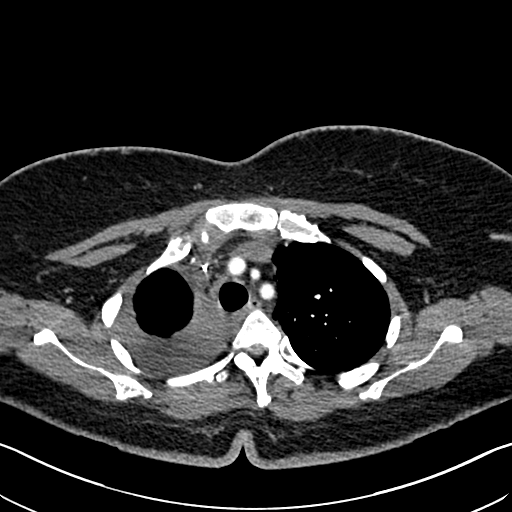
[im 236/299  lung]
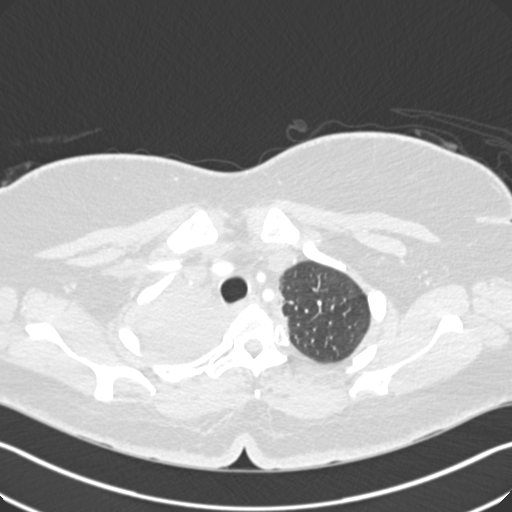
[im 251/299  mediastinal]
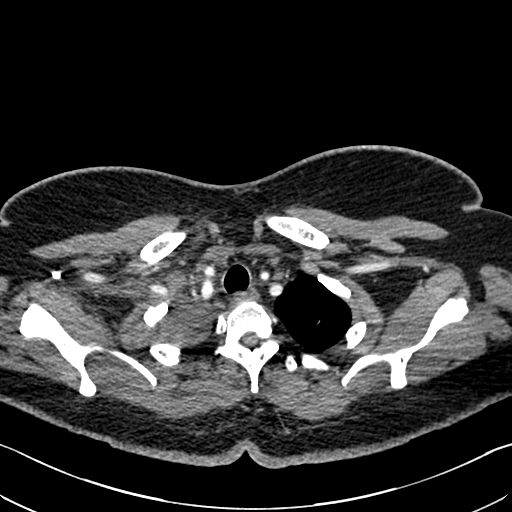
[im 267/299  lung]
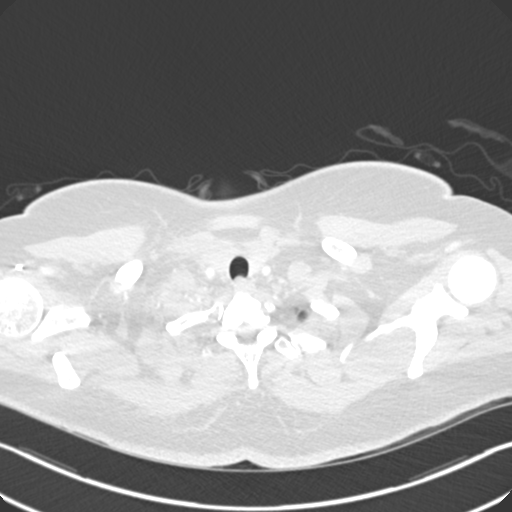
[im 283/299  mediastinal]
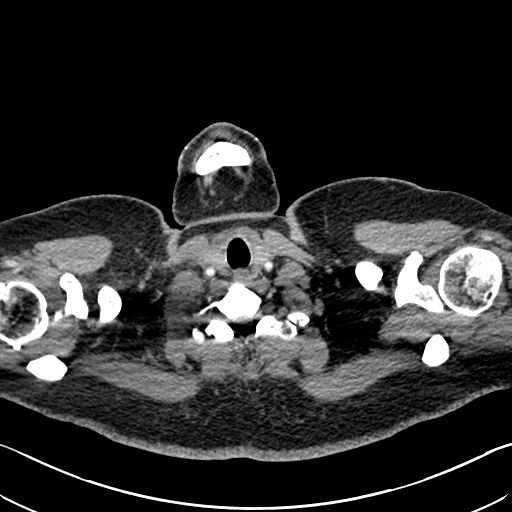

[18 of 36 positions shown; findings below may reference images not displayed]

FINDINGS: Cardiovascular: Satisfactory opacification of the pulmonary arteries
to the segmental level. No evidence of pulmonary embolism. Normal
heart size. No pericardial effusion.

Mediastinum/Nodes: There is precarinal adenopathy with the nodal
mass measuring 3.8 cm, slightly increased from 3.6 cm on the prior
study.

Lungs/Pleura: There is a new moderate right pleural effusion with
increased consolidation in the right upper and lower lobes, which I
suspect is primarily due to radiation effect.

Upper Abdomen: No acute abnormality.

Musculoskeletal: No chest wall abnormality. No acute or significant
osseous findings.

Review of the MIP images confirms the above findings.
IMPRESSION: 1. No pulmonary emboli.
2. New moderate right pleural effusion.
3. Increase consolidation in the right upper and lower lobes,
probably radiation effect.
4. Slight increase in precarinal adenopathy.

## 2017-06-03 ENCOUNTER — Emergency Department (HOSPITAL_COMMUNITY)
Admission: EM | Admit: 2017-06-03 | Discharge: 2017-06-03 | Disposition: A | Discharge status: Home / Self Care | Payer: Medicaid Other | Attending: Emergency Medicine | Admitting: Emergency Medicine

## 2017-06-03 ENCOUNTER — Emergency Department (HOSPITAL_COMMUNITY): Payer: Medicaid Other

## 2017-06-03 ENCOUNTER — Other Ambulatory Visit: Payer: Self-pay

## 2017-06-03 ENCOUNTER — Encounter (HOSPITAL_COMMUNITY): Payer: Self-pay

## 2017-06-03 DIAGNOSIS — Y9389 Activity, other specified: Secondary | ICD-10-CM | POA: Insufficient documentation

## 2017-06-03 DIAGNOSIS — I110 Hypertensive heart disease with heart failure: Secondary | ICD-10-CM | POA: Diagnosis not present

## 2017-06-03 DIAGNOSIS — Z794 Long term (current) use of insulin: Secondary | ICD-10-CM | POA: Insufficient documentation

## 2017-06-03 DIAGNOSIS — E039 Hypothyroidism, unspecified: Secondary | ICD-10-CM | POA: Insufficient documentation

## 2017-06-03 DIAGNOSIS — Z79899 Other long term (current) drug therapy: Secondary | ICD-10-CM | POA: Diagnosis not present

## 2017-06-03 DIAGNOSIS — Y999 Unspecified external cause status: Secondary | ICD-10-CM | POA: Diagnosis not present

## 2017-06-03 DIAGNOSIS — Z85118 Personal history of other malignant neoplasm of bronchus and lung: Secondary | ICD-10-CM | POA: Insufficient documentation

## 2017-06-03 DIAGNOSIS — S40012A Contusion of left shoulder, initial encounter: Secondary | ICD-10-CM | POA: Diagnosis not present

## 2017-06-03 DIAGNOSIS — J45909 Unspecified asthma, uncomplicated: Secondary | ICD-10-CM | POA: Diagnosis not present

## 2017-06-03 DIAGNOSIS — S39012A Strain of muscle, fascia and tendon of lower back, initial encounter: Secondary | ICD-10-CM | POA: Diagnosis not present

## 2017-06-03 DIAGNOSIS — Z7982 Long term (current) use of aspirin: Secondary | ICD-10-CM | POA: Insufficient documentation

## 2017-06-03 DIAGNOSIS — I5020 Unspecified systolic (congestive) heart failure: Secondary | ICD-10-CM | POA: Insufficient documentation

## 2017-06-03 DIAGNOSIS — Y92410 Unspecified street and highway as the place of occurrence of the external cause: Secondary | ICD-10-CM | POA: Diagnosis not present

## 2017-06-03 DIAGNOSIS — E119 Type 2 diabetes mellitus without complications: Secondary | ICD-10-CM | POA: Diagnosis not present

## 2017-06-03 DIAGNOSIS — S3982XA Other specified injuries of lower back, initial encounter: Secondary | ICD-10-CM | POA: Diagnosis present

## 2017-06-03 LAB — CBC WITH DIFFERENTIAL/PLATELET
BASOS PCT: 0 %
Basophils Absolute: 0 10*3/uL (ref 0.0–0.1)
EOS ABS: 0.4 10*3/uL (ref 0.0–0.7)
Eosinophils Relative: 7 %
HEMATOCRIT: 31.9 % — AB (ref 36.0–46.0)
HEMOGLOBIN: 10.1 g/dL — AB (ref 12.0–15.0)
LYMPHS ABS: 1.2 10*3/uL (ref 0.7–4.0)
Lymphocytes Relative: 23 %
MCH: 29.1 pg (ref 26.0–34.0)
MCHC: 31.7 g/dL (ref 30.0–36.0)
MCV: 91.9 fL (ref 78.0–100.0)
MONOS PCT: 4 %
Monocytes Absolute: 0.2 10*3/uL (ref 0.1–1.0)
NEUTROS ABS: 3.5 10*3/uL (ref 1.7–7.7)
NEUTROS PCT: 66 %
Platelets: 331 10*3/uL (ref 150–400)
RBC: 3.47 MIL/uL — AB (ref 3.87–5.11)
RDW: 14.5 % (ref 11.5–15.5)
WBC: 5.4 10*3/uL (ref 4.0–10.5)

## 2017-06-03 LAB — BASIC METABOLIC PANEL
Anion gap: 10 (ref 5–15)
BUN: 12 mg/dL (ref 6–20)
CHLORIDE: 103 mmol/L (ref 101–111)
CO2: 27 mmol/L (ref 22–32)
Calcium: 9.3 mg/dL (ref 8.9–10.3)
Creatinine, Ser: 0.92 mg/dL (ref 0.44–1.00)
GFR calc non Af Amer: >60 mL/min (ref >60)
Glucose, Bld: 241 mg/dL — ABNORMAL HIGH (ref 65–99)
POTASSIUM: 3.8 mmol/L (ref 3.5–5.1)
SODIUM: 140 mmol/L (ref 135–145)

## 2017-06-03 MED ORDER — METHOCARBAMOL 500 MG PO TABS: 500.0000 mg | ORAL_TABLET | Freq: Three times a day (TID) | ORAL | 0 refills | Status: DC | PRN

## 2017-06-03 MED ORDER — OXYCODONE-ACETAMINOPHEN 5-325 MG PO TABS
1.0000 | ORAL_TABLET | Freq: Once | ORAL | Status: AC
  Administered 2017-06-03: 1 via ORAL
  Filled 2017-06-03: qty 1

## 2017-06-03 NOTE — ED Notes (Signed)
Patient transported to X-ray 
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
## 2017-06-03 NOTE — ED Notes (Signed)
AVS explained in detail. Knows to take Robaxin every 8 hours but not to drive or drink alcohol with medication. Pt has Percocet at home. Knows to follow up with FNP she normally sees. Aware of all upcoming oncology appointments. Ambulatory to wheelchair. Assisted patient to car. No other c/c. Sent patientt home with heat packs and demonstrated how to use them.

## 2017-06-09 ENCOUNTER — Other Ambulatory Visit: Payer: Self-pay | Admitting: Hematology
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
## 2017-06-11 ENCOUNTER — Emergency Department (HOSPITAL_COMMUNITY): Payer: Medicaid Other

## 2017-06-11 ENCOUNTER — Encounter (HOSPITAL_COMMUNITY): Payer: Self-pay | Admitting: Emergency Medicine

## 2017-06-11 ENCOUNTER — Emergency Department (HOSPITAL_COMMUNITY)
Admission: EM | Admit: 2017-06-11 | Discharge: 2017-06-11 | Disposition: A | Discharge status: Home / Self Care | Payer: Medicaid Other | Attending: Emergency Medicine | Admitting: Emergency Medicine

## 2017-06-11 DIAGNOSIS — M25512 Pain in left shoulder: Secondary | ICD-10-CM | POA: Diagnosis not present

## 2017-06-11 DIAGNOSIS — Y999 Unspecified external cause status: Secondary | ICD-10-CM | POA: Diagnosis not present

## 2017-06-11 DIAGNOSIS — S5002XA Contusion of left elbow, initial encounter: Secondary | ICD-10-CM | POA: Diagnosis not present

## 2017-06-11 DIAGNOSIS — S91002A Unspecified open wound, left ankle, initial encounter: Secondary | ICD-10-CM | POA: Insufficient documentation

## 2017-06-11 DIAGNOSIS — I509 Heart failure, unspecified: Secondary | ICD-10-CM | POA: Diagnosis not present

## 2017-06-11 DIAGNOSIS — Y939 Activity, unspecified: Secondary | ICD-10-CM | POA: Diagnosis not present

## 2017-06-11 DIAGNOSIS — Z794 Long term (current) use of insulin: Secondary | ICD-10-CM | POA: Insufficient documentation

## 2017-06-11 DIAGNOSIS — Z79899 Other long term (current) drug therapy: Secondary | ICD-10-CM | POA: Insufficient documentation

## 2017-06-11 DIAGNOSIS — I10 Essential (primary) hypertension: Secondary | ICD-10-CM | POA: Diagnosis not present

## 2017-06-11 DIAGNOSIS — Z85118 Personal history of other malignant neoplasm of bronchus and lung: Secondary | ICD-10-CM | POA: Insufficient documentation

## 2017-06-11 DIAGNOSIS — W109XXA Fall (on) (from) unspecified stairs and steps, initial encounter: Secondary | ICD-10-CM | POA: Diagnosis not present

## 2017-06-11 DIAGNOSIS — M25572 Pain in left ankle and joints of left foot: Secondary | ICD-10-CM

## 2017-06-11 DIAGNOSIS — Y929 Unspecified place or not applicable: Secondary | ICD-10-CM | POA: Diagnosis not present

## 2017-06-11 DIAGNOSIS — E119 Type 2 diabetes mellitus without complications: Secondary | ICD-10-CM | POA: Diagnosis not present

## 2017-06-11 DIAGNOSIS — Z7982 Long term (current) use of aspirin: Secondary | ICD-10-CM | POA: Diagnosis not present

## 2017-06-11 DIAGNOSIS — W19XXXA Unspecified fall, initial encounter: Secondary | ICD-10-CM

## 2017-06-11 DIAGNOSIS — M25562 Pain in left knee: Secondary | ICD-10-CM | POA: Diagnosis not present

## 2017-06-11 DIAGNOSIS — T07XXXA Unspecified multiple injuries, initial encounter: Secondary | ICD-10-CM | POA: Diagnosis present

## 2017-06-11 MED ORDER — ACETAMINOPHEN 325 MG PO TABS
650.0000 mg | ORAL_TABLET | Freq: Once | ORAL | Status: AC
  Administered 2017-06-11: 650 mg via ORAL
  Filled 2017-06-11: qty 2

## 2017-06-11 NOTE — Discharge Instructions (Signed)
Take 650 mg of Tylenol every 6 hours for pain or 600 mg of ibuprofen with food every 6 hours as needed for pain.  Apply ice for 15-20 minutes to any areas that are sore as frequently as needed.  Elevate the left leg above the level of the heart when you are sitting and resting to help with pain and swelling.  You can use your crutches at home as needed until your pain improves with putting weight on your left foot.  Wear the ankle brace as needed to help with your pain.    Keep the wound on her left ankle clean with warm water and soap daily then apply a clean Band-Aid daily.  You can also apply a topical antibiotic such as Neosporin.   If the wound on the left ankle becomes red or hot to the touch, please follow-up with your primary care provider or return to the emergency department for reevaluation.  If your pain in your shoulder, knee, and ankle does not start to improve within the next week, please call Dr. Trevor Mace office to schedule follow-up appointment.  If you have a new fall or injury, new numbness or weakness, or other concerning symptoms, please return to the emergency department for re-evaluation.

## 2017-06-11 NOTE — ED Notes (Signed)
Will discharge patient after brace is applied.
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
## 2017-06-11 NOTE — ED Provider Notes (Signed)
[facility] DEPT Provider Note   CSN: [id number] Arrival date & time: 06/11/17  1044     History   Chief Complaint Chief Complaint  Patient presents with  . Fall  . Ankle Pain  . Leg Pain  . Arm Pain    HPI Marilyn [name] is a 52 y.o. female with a h/o of arthritis, asthma ,CHF, DM Type II, HTN, sinus tachycardia, obesity, and lung CA presents to the emergency department with a chief complaint of fall.  The patient reports that she was trying to clear a large step when she lost her footing and fell onto her left side.  She states that her left knee hit the concrete sidewalk and went under her.  The side of her head was scratched by a nearby [place].  She denies hitting her head, LOC, nausea, or emesis.  She reports that she required assistance to get up off of the ground, but has been able to ambulate independently since the fall.  She denies right leg or arm pain numbness, or weakness.  No treatment prior to arrival.  She is not anticoagulated.   The history is provided by the patient. No language interpreter was used.    Past Medical History:  Diagnosis Date  . Arthritis   . Asthma   . Cardiomyopathy ([place])   . CHF (congestive heart failure) ([place])   . Diabetes mellitus    Type II  . Hemorrhoids   . Hypercholesteremia   . Hypertension   . Hypothyroidism   . Lung cancer (HCC)    Lung, Mets to skin- right flank  . 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 Obese   . Pneumonia 2017  . Tachycardia     Patient Active Problem List   Diagnosis Date Noted  . Non-ischemic cardiomyopathy ([place]) 02/04/2017  . 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 Acute systolic heart failure ([place]) 12/01/2016  . Primary cancer of right lung metastatic to other site ([facility] [place]) 11/24/2016  . SOB (shortness of breath) 10/26/2016  . Hypothyroidism 08/22/2016  . PICC (peripherally inserted central catheter) flush 07/16/2016  . Community acquired pneumonia   . Sinus tachycardia     . Thrush of mouth and esophagus ([place]) 04/07/2016  . Constipation 04/07/2016  . Palliative care by specialist   . Port catheter in place 01/19/2016  . Dehydration 01/14/2016  . Tachycardia 01/14/2016  . Adenocarcinoma of left lung, stage 3 ([place]) 01/01/2016  . Non-small cell lung cancer ([place]) 12/31/2015  . Mediastinal mass   . Chest pain 12/26/2015  . Headache 12/26/2015  . Diabetes mellitus ([place]) 12/26/2015    Past Surgical History:  Procedure Laterality Date  . CESAREAN SECTION    . COLONOSCOPY    . I/D  arm Right 2013  . IR FLUORO GUIDE PORT INSERTION RIGHT  02/18/2017  . IR GENERIC HISTORICAL  01/08/2016   IR US GUIDE VASC ACCESS RIGHT 01/08/2016 [name], DO [facility]  . IR GENERIC HISTORICAL  01/08/2016   IR FLUORO GUIDE CV LINE RIGHT 01/08/2016 [name], DO [facility]  . IR US GUIDE VASC ACCESS RIGHT  02/18/2017  . MULTIPLE EXTRACTIONS WITH ALVEOLOPLASTY N/A 03/25/2017   Procedure: MULTIPLE EXTRACTION WITH ALVEOLOPLASTY (teeth #s four, six, seven, eight, nine, ten, eleven, one, twenty-three, twenty-four, twenty-five, twenty-six, twenty-nine, thirty);  Surgeon: [name], [name];  Location: [facility];  Service: Oral Surgery;  Laterality: N/A;  . TUBAL LIGATION    . US guided core  needle biopsy     of right lower neck/supraclavicular lymph nodes    OB History    No data available       Home Medications    Prior to Admission medications   Medication Sig Start Date End Date Taking? Authorizing Provider  aspirin EC 81 MG tablet Take 1 tablet (81 mg total) by mouth daily. 07/26/16   [name], MD  carvedilol (COREG) 6.25 MG tablet Take 1 tablet (6.25 mg total) by mouth 2 (two) times daily. Patient taking differently: Take 6.25 mg by mouth daily.  12/01/16   [name], [name], MD  DULoxetine (CYMBALTA) 60 MG capsule Take 1 capsule (60 mg total) by mouth daily. 09/20/16   [name], MD  insulin aspart (NOVOLOG) 100 UNIT/ML FlexPen As per sliding  scale instructions Patient taking differently: Inject 0-10 Units into the skin 3 (three) times daily. As per sliding scale instructions 01/16/16   [name], MD  Insulin Degludec (TRESIBA FLEXTOUCH) 200 UNIT/ML SOPN Inject 100 Units into the skin daily before breakfast.     [provider]  levothyroxine (SYNTHROID, LEVOTHROID) 125 MCG tablet Take 1 tablet (125 mcg total) by mouth daily before breakfast. 03/21/17   [name], MD  levothyroxine (SYNTHROID, LEVOTHROID) 125 MCG tablet TAKE 1 TABLET BY MOUTH ONCE DAILY BEFORE BREAKFAST 06/09/17   [name], MD  lidocaine-prilocaine (EMLA) cream Apply 1 application topically as needed. 04/20/17   [name], MD  loratadine (CLARITIN) 10 MG tablet Take 10 mg by mouth daily.    [provider]  losartan (COZAAR) 25 MG tablet Take 1 tablet (25 mg total) by mouth daily. 02/04/17 06/03/17  [name], [name], MD  methocarbamol (ROBAXIN) 500 MG tablet Take 1 tablet (500 mg total) by mouth every 8 (eight) hours as needed for muscle spasms. 06/03/17   [name], MD  miconazole (MICOTIN) 2 % powder Apply topically 2 (two) times daily. Apply to area of rash over the perineum Patient not taking: Reported on 06/03/2017 03/21/17   [name], MD  omeprazole (PRILOSEC) 20 MG capsule Take 1 capsule (20 mg total) by mouth daily. Patient not taking: Reported on 06/03/2017 04/20/17   [name], MD  oxyCODONE-acetaminophen (PERCOCET) 5-325 MG tablet Take 1 tablet by mouth every 6 (six) hours as needed. 03/25/17   [name], [name]  polyethylene glycol [facility]) packet Take 17 g by mouth daily. Patient not taking: Reported on 06/03/2017 05/24/16   [name], MD  senna-docusate (SENNA S) 8.6-50 MG tablet Take 2 tablets by mouth 2 (two) times daily. May reduce to 2 tab po HS as needed once regular BM estabished Patient taking differently: Take 2 tablets by mouth 2 (two) times daily as needed  (FOR CONSTIPATION). = 05/24/16   [name], MD  traZODone (DESYREL) 50 MG tablet Take 1-2 tablets (50-100 mg total) by mouth at bedtime as needed for sleep. 05/16/17   [name], MD  prochlorperazine (COMPAZINE) 10 MG tablet Take 1 tablet (10 mg total) by mouth every 6 (six) hours as needed (Nausea or vomiting). 01/01/16 03/03/16  [name], MD    Family History Family History  Problem Relation Age of Onset  . Heart failure Mother   . Hypertension Mother   . Cancer Neg Hx   . Rheumatologic disease Neg Hx     Social History Social History   Tobacco Use  . Smoking status: Never Smoker  . Smokeless  tobacco: Never Used  Substance Use Topics  . Alcohol use: No  . Drug use: No     Allergies   Patient has no known allergies.   Review of Systems Review of Systems  Constitutional: Negative for activity change.  Eyes: Negative for visual disturbance.  Respiratory: Negative for shortness of breath.   Cardiovascular: Negative for chest pain.  Gastrointestinal: Negative for abdominal pain, nausea and vomiting.  Musculoskeletal: Positive for arthralgias, gait problem and myalgias. Negative for back pain, neck pain and neck stiffness.  Skin: Negative for rash.  Allergic/Immunologic: Positive for immunocompromised state.  Neurological: Negative for headaches.     Physical Exam Updated Vital Signs BP (!) 144/93 (BP Location: Right Arm)   Pulse (!) 106   Temp 98.1 F (36.7 C) (Oral)   Resp 17   SpO2 100%   Physical Exam  Constitutional: She is oriented to person, place, and time. No distress.  HENT:  Head: Normocephalic and atraumatic.  Eyes: Conjunctivae and EOM are normal. Pupils are equal, round, and reactive to light. No scleral icterus.  Neck: Normal range of motion. Neck supple.  Cardiovascular: Normal rate, regular rhythm, normal heart sounds and intact distal pulses. Exam reveals no gallop and no friction rub.  No murmur  heard. Pulmonary/Chest: Effort normal. No stridor. No respiratory distress. She has no wheezes. She has no rales.  Abdominal: Soft. She exhibits no distension.  Musculoskeletal: She exhibits tenderness. She exhibits no deformity.  Tender to palpation over the medial aspect of the left knee without overlying erythema, edema, warmth, or ecchymosis.  Tender to palpation over the lateral malleolus of the left ankle with an overlying superficial skin tear that is hemostatic.  Right hip is nontender to palpation.  Full active and passive range of motion of all joints of the left leg.  Tender to palpation over the left superior border of the scapula of the left shoulder.  Full active and passive range of motion of the bilateral shoulders, elbows, and wrists.  She is also diffusely tender to palpation over the left elbow.  Radial, DP, PT pulses are 2+ and symmetric.  Sensation is intact throughout.  5 out of 5 strength bilateral upper and lower extremities against resistance.  Able to independently move all fingers and toes.  Bears weight on the bilateral lower extremities.  Antalgic gait.  Neurological: She is alert and oriented to person, place, and time.  Skin: Skin is warm. No rash noted.  Psychiatric: Her behavior is normal.  Nursing note and vitals reviewed.    ED Treatments / Results  Labs (all labs ordered are listed, but only abnormal results are displayed) Labs Reviewed - No data to display  EKG  EKG Interpretation None       Radiology Dg Elbow Complete Left  Result Date: 06/11/2017 CLINICAL DATA:  Fall.  Left arm pain EXAM: LEFT ELBOW - COMPLETE 3+ VIEW COMPARISON:  None. FINDINGS: There is no evidence of fracture, dislocation, or joint effusion. There is no evidence of arthropathy or other focal bone abnormality. Soft tissues are unremarkable. IMPRESSION: Negative. Electronically Signed   By: Rolm Baptise M.D.   On: 06/11/2017 12:18   Dg Ankle Complete Left  Result Date:  06/11/2017 CLINICAL DATA:  Fall, left ankle pain EXAM: LEFT ANKLE COMPLETE - 3+ VIEW COMPARISON:  None. FINDINGS: Slight lateral soft tissue swelling. No fracture, subluxation or dislocation. Plantar calcaneal spur present. IMPRESSION: No acute bony abnormality. Electronically Signed   By: Rolm Baptise M.D.  On: 06/11/2017 12:19   Dg Shoulder Left  Result Date: 06/11/2017 CLINICAL DATA:  Pain after fall EXAM: LEFT SHOULDER - 2+ VIEW COMPARISON:  None. FINDINGS: There is no evidence of fracture or dislocation. There is no evidence of arthropathy or other focal bone abnormality. Soft tissues are unremarkable. IMPRESSION: Negative. Electronically Signed   By: Dorise Bullion III M.D   On: 06/11/2017 12:18   Dg Knee Complete 4 Views Left  Result Date: 06/11/2017 CLINICAL DATA:  Fall.  Left leg pain EXAM: LEFT KNEE - COMPLETE 4+ VIEW COMPARISON:  None. FINDINGS: No evidence of fracture, dislocation, or joint effusion. No evidence of arthropathy or other focal bone abnormality. Soft tissues are unremarkable. IMPRESSION: Negative. Electronically Signed   By: Rolm Baptise M.D.   On: 06/11/2017 12:19   Dg Humerus Left  Result Date: 06/11/2017 CLINICAL DATA:  Left arm pain after a fall. EXAM: LEFT HUMERUS - 2+ VIEW COMPARISON:  Shoulder and elbow films of same date. FINDINGS: No acute fracture or dislocation. IMPRESSION: No acute osseous abnormality. Electronically Signed   By: Abigail Miyamoto M.D.   On: 06/11/2017 12:53    Procedures Procedures (including critical care time)  Medications Ordered in ED Medications  acetaminophen (TYLENOL) tablet 650 mg (650 mg Oral Given 06/11/17 1532)     Initial Impression / Assessment and Plan / ED Course  I have reviewed the triage vital signs and the nursing notes.  Pertinent labs & imaging results that were available during my care of the patient were reviewed by me and considered in my medical decision making (see chart for details).     52 year old female  presenting with a h/o of arthritis, asthma ,CHF, DM Type II, HTN, sinus tachycardia, obesity, and lung CA presenting with left knee, ankle, shoulder, and elbow pain after mechanical fall.  She denies hitting her head, LOC, nausea, or emesis.  She is not anticoagulated.  X-rays have been reviewed and are negative for fracture dislocation.  Tylenol ordered for pain control.  Patient  declines crutches at this time as she has a set at home.  Wound care for the skin tear of the left ankle has been performed in the ED.  We will discharge the patient to home with a brace for the left ankle instructions that she can bear weight as her pain allows and RICE therapy.  She has been provided a referral for orthopedics if her symptoms do not start to improve within the next week.  Strict return precautions given.  No acute distress.  The patient is safe for discharge home at this time.  Final Clinical Impressions(s) / ED Diagnoses   Final diagnoses:  Fall, initial encounter  Acute pain of left shoulder  Contusion of left elbow, initial encounter  Acute pain of left knee  Acute left ankle pain    ED Discharge Orders    None       Joanne Gavel, PA-C 06/11/17 1645    Isla Pence, MD 06/12/17 [phone]

## 2017-06-13 ENCOUNTER — Inpatient Hospital Stay: Payer: Medicaid Other

## 2017-06-13 ENCOUNTER — Encounter: Payer: Self-pay | Admitting: Hematology

## 2017-06-13 ENCOUNTER — Inpatient Hospital Stay: Payer: Medicaid Other | Attending: Hematology | Admitting: Hematology

## 2017-06-13 VITALS — BP 137/80 | HR 108

## 2017-06-13 VITALS — BP 97/76 | HR 115 | Temp 98.9°F | Resp 20 | Ht 69.5 in | Wt 286.4 lb

## 2017-06-13 DIAGNOSIS — Z452 Encounter for adjustment and management of vascular access device: Secondary | ICD-10-CM

## 2017-06-13 DIAGNOSIS — M129 Arthropathy, unspecified: Secondary | ICD-10-CM | POA: Diagnosis not present

## 2017-06-13 DIAGNOSIS — Z923 Personal history of irradiation: Secondary | ICD-10-CM | POA: Diagnosis not present

## 2017-06-13 DIAGNOSIS — R000 Tachycardia, unspecified: Secondary | ICD-10-CM | POA: Insufficient documentation

## 2017-06-13 DIAGNOSIS — Z95828 Presence of other vascular implants and grafts: Secondary | ICD-10-CM

## 2017-06-13 DIAGNOSIS — G4700 Insomnia, unspecified: Secondary | ICD-10-CM | POA: Diagnosis not present

## 2017-06-13 DIAGNOSIS — C792 Secondary malignant neoplasm of skin: Secondary | ICD-10-CM

## 2017-06-13 DIAGNOSIS — R5383 Other fatigue: Secondary | ICD-10-CM

## 2017-06-13 DIAGNOSIS — T451X5A Adverse effect of antineoplastic and immunosuppressive drugs, initial encounter: Secondary | ICD-10-CM | POA: Diagnosis not present

## 2017-06-13 DIAGNOSIS — E7800 Pure hypercholesterolemia, unspecified: Secondary | ICD-10-CM

## 2017-06-13 DIAGNOSIS — E1140 Type 2 diabetes mellitus with diabetic neuropathy, unspecified: Secondary | ICD-10-CM | POA: Insufficient documentation

## 2017-06-13 DIAGNOSIS — D6481 Anemia due to antineoplastic chemotherapy: Secondary | ICD-10-CM | POA: Diagnosis not present

## 2017-06-13 DIAGNOSIS — R079 Chest pain, unspecified: Secondary | ICD-10-CM | POA: Insufficient documentation

## 2017-06-13 DIAGNOSIS — J45909 Unspecified asthma, uncomplicated: Secondary | ICD-10-CM | POA: Insufficient documentation

## 2017-06-13 DIAGNOSIS — G893 Neoplasm related pain (acute) (chronic): Secondary | ICD-10-CM | POA: Diagnosis not present

## 2017-06-13 DIAGNOSIS — C3491 Malignant neoplasm of unspecified part of right bronchus or lung: Secondary | ICD-10-CM

## 2017-06-13 DIAGNOSIS — Z5112 Encounter for antineoplastic immunotherapy: Secondary | ICD-10-CM | POA: Insufficient documentation

## 2017-06-13 DIAGNOSIS — I10 Essential (primary) hypertension: Secondary | ICD-10-CM | POA: Diagnosis not present

## 2017-06-13 DIAGNOSIS — I509 Heart failure, unspecified: Secondary | ICD-10-CM | POA: Diagnosis not present

## 2017-06-13 DIAGNOSIS — E039 Hypothyroidism, unspecified: Secondary | ICD-10-CM

## 2017-06-13 DIAGNOSIS — Z79899 Other long term (current) drug therapy: Secondary | ICD-10-CM | POA: Insufficient documentation

## 2017-06-13 DIAGNOSIS — R599 Enlarged lymph nodes, unspecified: Secondary | ICD-10-CM

## 2017-06-13 DIAGNOSIS — R918 Other nonspecific abnormal finding of lung field: Secondary | ICD-10-CM | POA: Insufficient documentation

## 2017-06-13 DIAGNOSIS — Z8701 Personal history of pneumonia (recurrent): Secondary | ICD-10-CM

## 2017-06-13 DIAGNOSIS — Z9221 Personal history of antineoplastic chemotherapy: Secondary | ICD-10-CM | POA: Diagnosis not present

## 2017-06-13 DIAGNOSIS — E669 Obesity, unspecified: Secondary | ICD-10-CM | POA: Insufficient documentation

## 2017-06-13 DIAGNOSIS — I429 Cardiomyopathy, unspecified: Secondary | ICD-10-CM | POA: Insufficient documentation

## 2017-06-13 DIAGNOSIS — Z7982 Long term (current) use of aspirin: Secondary | ICD-10-CM | POA: Insufficient documentation

## 2017-06-13 DIAGNOSIS — C778 Secondary and unspecified malignant neoplasm of lymph nodes of multiple regions: Secondary | ICD-10-CM

## 2017-06-13 DIAGNOSIS — Z794 Long term (current) use of insulin: Secondary | ICD-10-CM | POA: Insufficient documentation

## 2017-06-13 LAB — RETICULOCYTES
RBC.: 3.21 MIL/uL — ABNORMAL LOW (ref 3.70–5.45)
Retic Count, Absolute: 51.4 10*3/uL (ref 33.7–90.7)
Retic Ct Pct: 1.6 % (ref 0.7–2.1)

## 2017-06-13 LAB — CMP (CANCER CENTER ONLY)
ALBUMIN: 3.2 g/dL — AB (ref 3.5–5.0)
ALT: 14 U/L (ref 0–55)
AST: 17 U/L (ref 5–34)
Alkaline Phosphatase: 102 U/L (ref 40–150)
Anion gap: 9 (ref 3–11)
BUN: 9 mg/dL (ref 7–26)
CHLORIDE: 106 mmol/L (ref 98–109)
CO2: 26 mmol/L (ref 22–29)
Calcium: 9 mg/dL (ref 8.4–10.4)
Creatinine: 0.86 mg/dL (ref 0.60–1.10)
GFR, Est AFR Am: >60 mL/min (ref >60)
GFR, Estimated: >60 mL/min (ref >60)
GLUCOSE: 260 mg/dL — AB (ref 70–140)
POTASSIUM: 3.9 mmol/L (ref 3.5–5.1)
SODIUM: 141 mmol/L (ref 136–145)
Total Bilirubin: 0.3 mg/dL (ref 0.2–1.2)
Total Protein: 7.5 g/dL (ref 6.4–8.3)

## 2017-06-13 LAB — CBC WITH DIFFERENTIAL (CANCER CENTER ONLY)
Basophils Absolute: 0 10*3/uL (ref 0.0–0.1)
Basophils Relative: 0 %
EOS PCT: 6 %
Eosinophils Absolute: 0.3 10*3/uL (ref 0.0–0.5)
HCT: 29.4 % — ABNORMAL LOW (ref 34.8–46.6)
Hemoglobin: 9.4 g/dL — ABNORMAL LOW (ref 11.6–15.9)
LYMPHS ABS: 1 10*3/uL (ref 0.9–3.3)
LYMPHS PCT: 22 %
MCH: 29.3 pg (ref 25.1–34.0)
MCHC: 32 g/dL (ref 31.5–36.0)
MCV: 91.6 fL (ref 79.5–101.0)
MONOS PCT: 5 %
Monocytes Absolute: 0.2 10*3/uL (ref 0.1–0.9)
Neutro Abs: 3.2 10*3/uL (ref 1.5–6.5)
Neutrophils Relative %: 67 %
PLATELETS: 239 10*3/uL (ref 145–400)
RBC: 3.21 MIL/uL — ABNORMAL LOW (ref 3.70–5.45)
RDW: 14.1 % (ref 11.2–14.5)
WBC Count: 4.8 10*3/uL (ref 3.9–10.3)

## 2017-06-13 LAB — TSH: TSH: 12.633 u[IU]/mL — AB (ref 0.308–3.960)

## 2017-06-13 IMAGING — CR DG CHEST 2V
2 series · 2 of 2 positions shown · non-contrast
Comparison: Chest radiograph July 03, 2016 and chest CT July 05, 2016

CLINICAL DATA: Lung carcinoma with shortness of breath

EXAM:
CHEST  2 VIEW

[w chest pa]
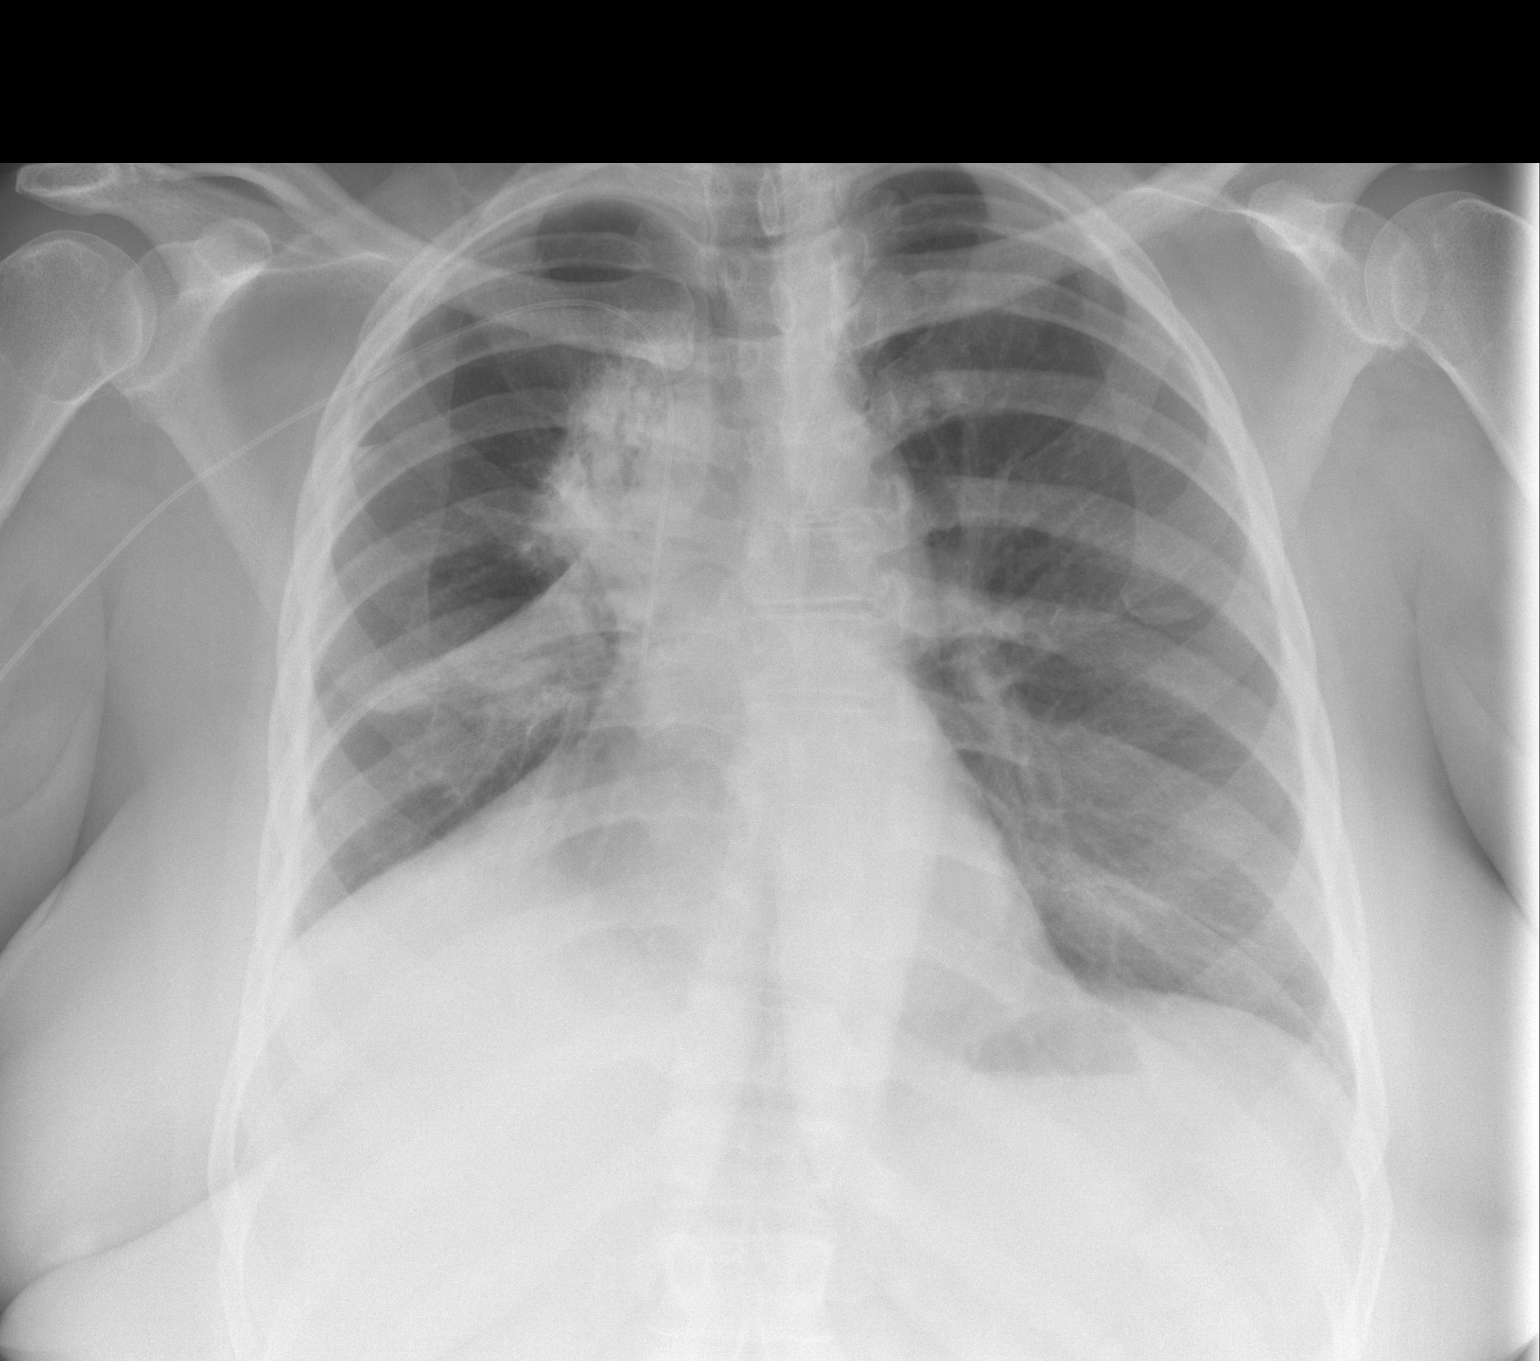

[w chest lat]
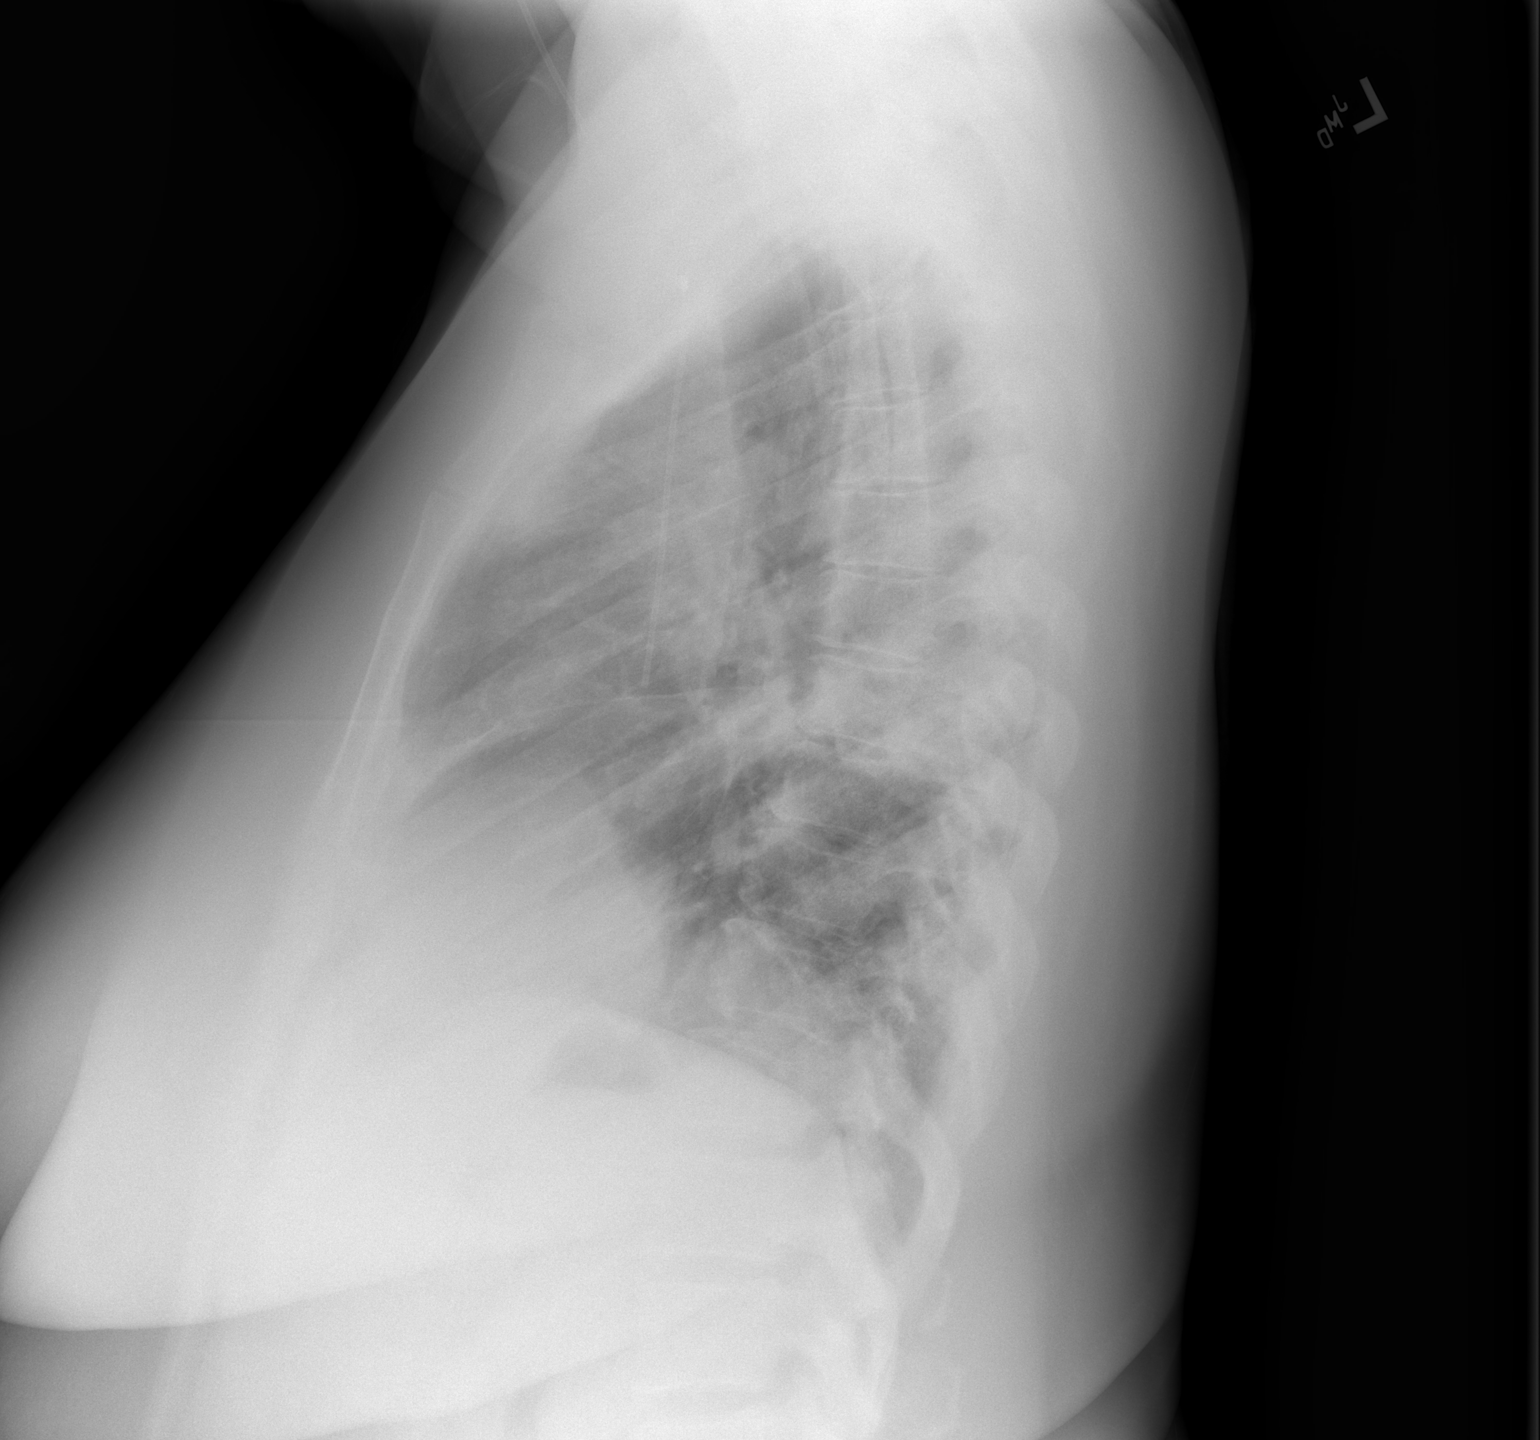

[2 of 2 positions shown; findings below may reference images not displayed]

FINDINGS: There is persistent consolidation and effusion in the right middle
and right lower lobes. There is confluent opacity abutting the hilum
in the posterior segment of the right upper lobe. These changes are
stable. The left lung is clear. Heart size and pulmonary vascular
normal. Central catheter tip is in the superior vena cava. No bone
lesions. No demonstrable adenopathy by radiography.
IMPRESSION: Persistent consolidation in the right perihilar region with probable
radiation therapy change. Areas of patchy consolidation in the right
middle and lower lobes with right pleural effusion, stable. Left
lung clear. Stable cardiac silhouette. Central catheter tip in
superior vena cava. No evident pneumothorax.

## 2017-06-13 MED ORDER — SODIUM CHLORIDE 0.9% FLUSH
10.0000 mL | INTRAVENOUS | Status: DC | PRN
  Administered 2017-06-13: 10 mL
  Filled 2017-06-13: qty 10

## 2017-06-13 MED ORDER — SODIUM CHLORIDE 0.9 % IV SOLN
240.0000 mg | Freq: Once | INTRAVENOUS | Status: AC
  Administered 2017-06-13: 240 mg via INTRAVENOUS
  Filled 2017-06-13: qty 24

## 2017-06-13 MED ORDER — SODIUM CHLORIDE 0.9 % IJ SOLN
10.0000 mL | INTRAMUSCULAR | Status: DC | PRN
  Administered 2017-06-13: 10 mL via INTRAVENOUS
  Filled 2017-06-13: qty 10

## 2017-06-13 MED ORDER — HEPARIN SOD (PORK) LOCK FLUSH 100 UNIT/ML IV SOLN
500.0000 [IU] | Freq: Once | INTRAVENOUS | Status: AC | PRN
  Administered 2017-06-13: 500 [IU]
  Filled 2017-06-13: qty 5

## 2017-06-13 MED ORDER — ALTEPLASE 2 MG IJ SOLR
2.0000 mg | Freq: Once | INTRAMUSCULAR | Status: AC | PRN
  Administered 2017-06-13: 2 mg
  Filled 2017-06-13: qty 2

## 2017-06-13 MED ORDER — ALTEPLASE 2 MG IJ SOLR
INTRAMUSCULAR | Status: AC
  Filled 2017-06-13: qty 2

## 2017-06-13 MED ORDER — SODIUM CHLORIDE 0.9 % IV SOLN
Freq: Once | INTRAVENOUS | Status: AC
  Administered 2017-06-13: 16:00:00 via INTRAVENOUS

## 2017-06-13 NOTE — Progress Notes (Signed)
Port flushed several times with no blood return. Labs were drawn peripherally by Lab 4. Dr. Irene Limbo desk nurse called and will administer CATHFLOW. Marilyn Elkhatib LPN

## 2017-06-13 NOTE — Progress Notes (Signed)
Per MD Irene Limbo ok to treat with TSH 12.63 today.  Pt reports compliance with thryoid medication.  Rechecked port for blood return at 1520.  Achieved good blood return after cathflo.  Removed cathflo from port.  Will proceed with treatment.

## 2017-06-13 NOTE — Patient Instructions (Signed)
[facility] Discharge Instructions for Patients Receiving Chemotherapy  Today you received the following chemotherapy agent:  Nivolumab.  To help prevent nausea and vomiting after your treatment, we encourage you to take your nausea medication as prescribed.   If you develop nausea and vomiting that is not controlled by your nausea medication, call the clinic.   BELOW ARE SYMPTOMS THAT SHOULD BE REPORTED IMMEDIATELY:  *FEVER GREATER THAN 100.5 F  *CHILLS WITH OR WITHOUT FEVER  NAUSEA AND VOMITING THAT IS NOT CONTROLLED WITH YOUR NAUSEA MEDICATION  *UNUSUAL SHORTNESS OF BREATH  *UNUSUAL BRUISING OR BLEEDING  TENDERNESS IN MOUTH AND THROAT WITH OR WITHOUT PRESENCE OF ULCERS  *URINARY PROBLEMS  *BOWEL PROBLEMS  UNUSUAL RASH Items with * indicate a potential emergency and should be followed up as soon as possible.  Feel free to call the clinic should you have any questions or concerns. The clinic phone number is [phone].  Please show the [facility] at check-in to the Emergency Department and triage nurse.

## 2017-06-14 ENCOUNTER — Telehealth: Payer: Self-pay

## 2017-06-14 NOTE — Telephone Encounter (Signed)
Spoke with patient ion the phone concerning her upcoming appointment that was scheduled for her. Per 3/11 los (patient will get a print out at her next visit

## 2017-06-15 MED ORDER — LEVOTHYROXINE SODIUM 150 MCG PO TABS: 150.0000 ug | ORAL_TABLET | Freq: Every day | ORAL | 2 refills | Status: DC

## 2017-06-16 ENCOUNTER — Telehealth: Payer: Self-pay

## 2017-06-16 NOTE — Telephone Encounter (Signed)
Called pt to notify of low thyroid levels based on recent lab work. Dr. Irene Limbo has sent in prescription for increased levothyroxine dose. LVM on pt phone to let her know that she can pick it up at [facility] on [address].

## 2017-06-21 ENCOUNTER — Other Ambulatory Visit: Payer: Self-pay

## 2017-06-21 ENCOUNTER — Emergency Department (HOSPITAL_COMMUNITY): Payer: Medicaid Other

## 2017-06-21 ENCOUNTER — Encounter (HOSPITAL_COMMUNITY): Payer: Self-pay

## 2017-06-21 ENCOUNTER — Emergency Department (HOSPITAL_COMMUNITY)
Admission: EM | Admit: 2017-06-21 | Discharge: 2017-06-21 | Disposition: A | Discharge status: Home / Self Care | Payer: Medicaid Other | Attending: Emergency Medicine | Admitting: Emergency Medicine

## 2017-06-21 DIAGNOSIS — Z7982 Long term (current) use of aspirin: Secondary | ICD-10-CM | POA: Insufficient documentation

## 2017-06-21 DIAGNOSIS — E1140 Type 2 diabetes mellitus with diabetic neuropathy, unspecified: Secondary | ICD-10-CM | POA: Diagnosis not present

## 2017-06-21 DIAGNOSIS — Z9221 Personal history of antineoplastic chemotherapy: Secondary | ICD-10-CM | POA: Diagnosis not present

## 2017-06-21 DIAGNOSIS — Z794 Long term (current) use of insulin: Secondary | ICD-10-CM | POA: Insufficient documentation

## 2017-06-21 DIAGNOSIS — I110 Hypertensive heart disease with heart failure: Secondary | ICD-10-CM | POA: Insufficient documentation

## 2017-06-21 DIAGNOSIS — Z79899 Other long term (current) drug therapy: Secondary | ICD-10-CM | POA: Insufficient documentation

## 2017-06-21 DIAGNOSIS — J45909 Unspecified asthma, uncomplicated: Secondary | ICD-10-CM | POA: Insufficient documentation

## 2017-06-21 DIAGNOSIS — Z85118 Personal history of other malignant neoplasm of bronchus and lung: Secondary | ICD-10-CM | POA: Insufficient documentation

## 2017-06-21 DIAGNOSIS — R0789 Other chest pain: Secondary | ICD-10-CM

## 2017-06-21 DIAGNOSIS — I509 Heart failure, unspecified: Secondary | ICD-10-CM | POA: Diagnosis not present

## 2017-06-21 DIAGNOSIS — E039 Hypothyroidism, unspecified: Secondary | ICD-10-CM | POA: Diagnosis not present

## 2017-06-21 DIAGNOSIS — R072 Precordial pain: Secondary | ICD-10-CM | POA: Diagnosis not present

## 2017-06-21 LAB — BASIC METABOLIC PANEL
ANION GAP: 10 (ref 5–15)
BUN: 10 mg/dL (ref 6–20)
CALCIUM: 8.5 mg/dL — AB (ref 8.9–10.3)
CHLORIDE: 102 mmol/L (ref 101–111)
CO2: 28 mmol/L (ref 22–32)
CREATININE: 0.67 mg/dL (ref 0.44–1.00)
GFR calc non Af Amer: >60 mL/min (ref >60)
GLUCOSE: 137 mg/dL — AB (ref 65–99)
Potassium: 3.5 mmol/L (ref 3.5–5.1)
Sodium: 140 mmol/L (ref 135–145)

## 2017-06-21 LAB — CBC
HCT: 30.6 % — ABNORMAL LOW (ref 36.0–46.0)
HEMOGLOBIN: 9.7 g/dL — AB (ref 12.0–15.0)
MCH: 28.8 pg (ref 26.0–34.0)
MCHC: 31.7 g/dL (ref 30.0–36.0)
MCV: 90.8 fL (ref 78.0–100.0)
Platelets: 326 10*3/uL (ref 150–400)
RBC: 3.37 MIL/uL — AB (ref 3.87–5.11)
RDW: 13.7 % (ref 11.5–15.5)
WBC: 6.6 10*3/uL (ref 4.0–10.5)

## 2017-06-21 LAB — I-STAT TROPONIN, ED: Troponin i, poc: 0 ng/mL (ref 0.00–0.08)

## 2017-06-21 LAB — I-STAT BETA HCG BLOOD, ED (MC, WL, AP ONLY): I-stat hCG, quantitative: <5 m[IU]/mL (ref <5)

## 2017-06-21 MED ORDER — HEPARIN SOD (PORK) LOCK FLUSH 100 UNIT/ML IV SOLN
500.0000 [IU] | Freq: Once | INTRAVENOUS | Status: AC
  Administered 2017-06-21: 500 [IU]
  Filled 2017-06-21: qty 5

## 2017-06-21 MED ORDER — SODIUM CHLORIDE 0.9 % IJ SOLN
INTRAMUSCULAR | Status: AC
  Filled 2017-06-21: qty 50

## 2017-06-21 MED ORDER — NAPROXEN 500 MG PO TABS: 500.0000 mg | ORAL_TABLET | Freq: Two times a day (BID) | ORAL | 0 refills | Status: DC

## 2017-06-21 MED ORDER — NAPROXEN 500 MG PO TABS
500.0000 mg | ORAL_TABLET | Freq: Once | ORAL | Status: AC
  Administered 2017-06-21: 500 mg via ORAL
  Filled 2017-06-21: qty 1

## 2017-06-21 MED ORDER — HYDROCODONE-ACETAMINOPHEN 5-325 MG PO TABS
1.0000 | ORAL_TABLET | Freq: Once | ORAL | Status: AC
  Administered 2017-06-21: 1 via ORAL
  Filled 2017-06-21: qty 1

## 2017-06-21 MED ORDER — IOPAMIDOL (ISOVUE-370) INJECTION 76%
INTRAVENOUS | Status: AC
  Administered 2017-06-21: 100 mL
  Filled 2017-06-21: qty 100

## 2017-06-21 NOTE — ED Triage Notes (Signed)
Patient c/o constant chest pain that radiates into the right neck x 3 days. Patient also c/o SOB and dizziness.

## 2017-06-21 NOTE — ED Notes (Signed)
Patient transported to X-ray 

## 2017-06-21 NOTE — Discharge Instructions (Signed)
We saw you in the ER for the chest pain/neck pain. All of our cardiac workup is normal, including labs, EKG and chest X-RAY are normal. We are not sure what is causing your discomfort, but we feel comfortable sending you home at this time. The workup in the ER is not complete, and you should follow up with your primary care doctor for further evaluation.  Please return to the ER if you have worsening chest pain, shortness of breath, pain radiating to your jaw, shoulder, or back, sweats or fainting. Otherwise see the Cardiologist or your primary care doctor as requested.

## 2017-06-21 NOTE — ED Provider Notes (Addendum)
[facility] DEPT Provider Note   CSN: [id number] Arrival date & time: 06/21/17  1530     History   Chief Complaint Chief Complaint  Patient presents with  . Chest Pain  . chemo patient    HPI Marilyn [name] is a 52 y.o. female.  HPI 52 year old female with history of non small cell carcinoma of the lung, CHF, diabetes on active chemo comes in with chief complaint of chest pain.  Patient states that she started having chest pain 3 days ago that is midsternal, and radiating to her back and her neck.  Pain also radiates up towards the head.  Patient also complained of shortness of breath and dizziness, however she thinks that those symptoms are chronic.  Her chest pain is worse with deep inspiration and also with palpitations.  Patient does not have any history of CAD, but she does have history of CHF.  Patient's last chemo session was last week.  Review of system is negative for any new numbness, tingling, weakness in her upper or lower extremities.  Patient's neck pain is worse with any lateral movement of the neck.  Past Medical History:  Diagnosis Date  . Arthritis   . Asthma   . Cardiomyopathy ([place])   . CHF (congestive heart failure) ([place])   . Diabetes mellitus    Type II  . Hemorrhoids   . Hypercholesteremia   . Hypertension   . Hypothyroidism   . Lung cancer (HCC)    Lung, Mets to skin- right flank  . 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 Obese   . Pneumonia 2017  . Tachycardia     Patient Active Problem List   Diagnosis Date Noted  . Non-ischemic cardiomyopathy ([place]) 02/04/2017  . 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 Acute systolic heart failure ([place]) 12/01/2016  . Primary cancer of right lung metastatic to other site ([place]) 11/24/2016  . SOB (shortness of breath) 10/26/2016  . Hypothyroidism 08/22/2016  . PICC (peripherally inserted central catheter) flush 07/16/2016  . Community acquired pneumonia   . Sinus tachycardia   .  Thrush of mouth and esophagus ([place]) 04/07/2016  . Constipation 04/07/2016  . Palliative care by specialist   . Port catheter in place 01/19/2016  . Dehydration 01/14/2016  . Tachycardia 01/14/2016  . Adenocarcinoma of left lung, stage 3 ([place]) 01/01/2016  . Non-small cell lung cancer ([place]) 12/31/2015  . Mediastinal mass   . Chest pain 12/26/2015  . Headache 12/26/2015  . Diabetes mellitus ([place]) 12/26/2015    Past Surgical History:  Procedure Laterality Date  . CESAREAN SECTION    . COLONOSCOPY    . I/D  arm Right 2013  . IR FLUORO GUIDE PORT INSERTION RIGHT  02/18/2017  . IR GENERIC HISTORICAL  01/08/2016   IR US GUIDE VASC ACCESS RIGHT 01/08/2016 [name], DO [facility]INTER[facility]  . IR GENERIC HISTORICAL  01/08/2016   IR FLUORO GUIDE CV LINE RIGHT 01/08/2016 [name], DO [facility]  . IR US GUIDE VASC ACCESS RIGHT  02/18/2017  . MULTIPLE EXTRACTIONS WITH ALVEOLOPLASTY N/A 03/25/2017   Procedure: MULTIPLE EXTRACTION WITH ALVEOLOPLASTY (teeth #s four, six, seven, eight, nine, ten, eleven, one, twenty-three, twenty-four, twenty-five, twenty-six, twenty-nine, thirty);  Surgeon: [name], [name];  Location: [facility];  Service: Oral Surgery;  Laterality: N/A;  . TUBAL LIGATION    . US guided core needle biopsy     of right lower neck/supraclavicular lymph nodes  OB History    No data available       Home Medications    Prior to Admission medications   Medication Sig Start Date End Date Taking? Authorizing Provider  aspirin EC 81 MG tablet Take 1 tablet (81 mg total) by mouth daily. 07/26/16  Yes [name], MD  carvedilol (COREG) 6.25 MG tablet Take 1 tablet (6.25 mg total) by mouth 2 (two) times daily. Patient taking differently: Take 6.25 mg by mouth daily.  12/01/16  Yes [name], [name], MD  DULoxetine (CYMBALTA) 60 MG capsule Take 1 capsule (60 mg total) by mouth daily. 09/20/16  Yes [name], MD  insulin aspart (NOVOLOG) 100 UNIT/ML FlexPen As per  sliding scale instructions Patient taking differently: Inject 0-10 Units into the skin 3 (three) times daily. As per sliding scale instructions 01/16/16  Yes [name], MD  Insulin Degludec (TRESIBA FLEXTOUCH) 200 UNIT/ML SOPN Inject 100 Units into the skin daily before breakfast.    Yes [provider]  levothyroxine (SYNTHROID, LEVOTHROID) 150 MCG tablet Take 1 tablet (150 mcg total) by mouth daily before breakfast. 06/15/17  Yes [name], MD  lidocaine-prilocaine (EMLA) cream Apply 1 application topically as needed. Patient taking differently: Apply 1 application topically as needed (access port).  04/20/17  Yes [name], MD  loratadine (CLARITIN) 10 MG tablet Take 10 mg by mouth daily.   Yes [provider]  losartan (COZAAR) 25 MG tablet Take 25 mg by mouth daily.   Yes [provider]  omeprazole (PRILOSEC) 20 MG capsule Take 1 capsule (20 mg total) by mouth daily. 04/20/17  Yes [name], MD  oxyCODONE-acetaminophen (PERCOCET) 5-325 MG tablet Take 1 tablet by mouth every 6 (six) hours as needed. Patient taking differently: Take 1 tablet by mouth every 6 (six) hours as needed for moderate pain or severe pain.  03/25/17  Yes [name], [name]  polyethylene glycol [facility]) packet Take 17 g by mouth daily. 05/24/16  Yes [name], MD  senna-docusate (SENNA S) 8.6-50 MG tablet Take 2 tablets by mouth 2 (two) times daily. May reduce to 2 tab po HS as needed once regular BM estabished Patient taking differently: Take 2 tablets by mouth 2 (two) times daily as needed (FOR CONSTIPATION). = 05/24/16  Yes [name], MD  traZODone (DESYREL) 50 MG tablet Take 1-2 tablets (50-100 mg total) by mouth at bedtime as needed for sleep. 05/16/17  Yes [name], MD  losartan (COZAAR) 25 MG tablet Take 1 tablet (25 mg total) by mouth daily. 02/04/17 06/03/17  [name], [name], MD  methocarbamol (ROBAXIN) 500 MG tablet  Take 1 tablet (500 mg total) by mouth every 8 (eight) hours as needed for muscle spasms. 06/03/17   [name], MD  miconazole (MICOTIN) 2 % powder Apply topically 2 (two) times daily. Apply to area of rash over the perineum Patient not taking: Reported on 06/21/2017 03/21/17   [name], MD  prochlorperazine (COMPAZINE) 10 MG tablet Take 1 tablet (10 mg total) by mouth every 6 (six) hours as needed (Nausea or vomiting). 01/01/16 03/03/16  [name], MD    Family History Family History  Problem Relation Age of Onset  . Heart failure Mother   . Hypertension Mother   . Cancer Neg Hx   . Rheumatologic disease Neg Hx     Social History Social History   Tobacco Use  . Smoking status: Never Smoker  .  Smokeless tobacco: Never Used  Substance Use Topics  . Alcohol use: No  . Drug use: No     Allergies   Patient has no known allergies.   Review of Systems Review of Systems  Constitutional: Positive for activity change.  Respiratory: Positive for chest tightness and shortness of breath.   Cardiovascular: Positive for chest pain.  Gastrointestinal: Negative for abdominal pain.  Allergic/Immunologic: Positive for immunocompromised state.  Neurological: Positive for headaches. Negative for dizziness, syncope, weakness, light-headedness and numbness.  Hematological: Does not bruise/bleed easily.  All other systems reviewed and are negative.    Physical Exam Updated Vital Signs BP 127/83   Pulse (!) 105   Temp 98.2 F (36.8 C) (Oral)   Resp 16   Ht 5' 9.5" (1.765 m)   Wt 129.7 kg (286 lb)   SpO2 100%   BMI 41.63 kg/m   Physical Exam  Constitutional: She is oriented to person, place, and time. She appears well-developed.  HENT:  Head: Normocephalic and atraumatic.  Eyes: EOM are normal.  Neck: Normal range of motion. Neck supple.  No bruits  Cardiovascular: Normal rate, regular rhythm, intact distal pulses and normal pulses.  Pulmonary/Chest:  Effort normal.  Abdominal: Bowel sounds are normal.  Musculoskeletal:       Right lower leg: She exhibits no tenderness and no edema.       Left lower leg: She exhibits no tenderness and no edema.  Neurological: She is alert and oriented to person, place, and time.  Skin: Skin is warm and dry.  Nursing note and vitals reviewed.    ED Treatments / Results  Labs (all labs ordered are listed, but only abnormal results are displayed) Labs Reviewed  BASIC METABOLIC PANEL - Abnormal; Notable for the following components:      Result Value   Glucose, Bld 137 (*)    Calcium 8.5 (*)    All other components within normal limits  CBC - Abnormal; Notable for the following components:   RBC 3.37 (*)    Hemoglobin 9.7 (*)    HCT 30.6 (*)    All other components within normal limits  I-STAT TROPONIN, ED  I-STAT BETA HCG BLOOD, ED ([facility], [facility], [facility] ONLY)    EKG  EKG Interpretation  Date/Time:  Tuesday June 21 2017 15:48:31 EDT Ventricular Rate:  119 PR Interval:    QRS Duration: 94 QT Interval:  297 QTC Calculation: 418 R Axis:   49 Text Interpretation:  Sinus tachycardia Ventricular premature complex Aberrant conduction of SV complex(es) No acute changes Nonspecific ST and T wave abnormality Confirmed by Varney Biles [id number]) on 06/21/2017 5:35:00 PM       Radiology Dg Chest 2 View  Result Date: 06/21/2017 CLINICAL DATA:  Chest pain radiating into the right neck EXAM: CHEST - 2 VIEW COMPARISON:  05/11/2017 FINDINGS: Cardiac shadow is within normal limits. Right chest wall port is noted with the catheter tip in the mid superior vena cava. Postradiation changes are seen in the right upper and lower lobes, stable from prior CT examination. Left lung remains clear. No sizable effusion is noted. No bony abnormality is seen. IMPRESSION: Chronic changes in the right lung.  No acute abnormality is noted. When compared with the prior CT examination there is some filling defect within the superior  vena cava which may represent thrombus. The history of right neck pain raises suspicion for right jugular thrombus. Ultrasound may be helpful for further evaluation. Electronically Signed   By: Elta Guadeloupe  Lukens M.D.   On: 06/21/2017 16:40   Ct Soft Tissue Neck W Contrast  Result Date: 06/21/2017 CLINICAL DATA:  Chest pain radiating into the right neck for 3 days. History of lung cancer. EXAM: CT NECK WITH CONTRAST TECHNIQUE: Multidetector CT imaging of the neck was performed using the standard protocol following the bolus administration of intravenous contrast. CONTRAST:  142mL ISOVUE-370 IOPAMIDOL (ISOVUE-370) INJECTION 76% COMPARISON:  PET-CT 01/12/2016.  Neck CT 12/27/2015. FINDINGS: The study is mildly motion degraded. Pharynx and larynx: No evidence of pharyngeal mass or swelling. Unremarkable larynx allowing for mild motion artifact. Salivary glands: No inflammation, mass, or stone. Thyroid: Unremarkable. Lymph nodes: Right-sided cervical lymphadenopathy on the prior studies has resolved. No new enlarged or suspicious cervical lymph nodes. Vascular: Partially visualized right internal jugular central venous catheter. Major vascular structures of the neck, including the right internal jugular vein, are patent. The internal jugular veins in the upper chest and brachiocephalic veins are obscured by beam hardening. Limited intracranial: Unremarkable. Visualized orbits: Unremarkable. Mastoids and visualized paranasal sinuses: Clear. Skeleton: Unchanged small sclerotic foci in the posterior elements at C2 and C3, likely bone islands. Mild lower cervical spondylosis. Upper chest: Reported separately. Other: None. IMPRESSION: 1. No acute abnormality identified in the neck. 2. Resolved cervical lymphadenopathy. Electronically Signed   By: Logan Bores M.D.   On: 06/21/2017 18:02   Ct Angio Chest Pe W And/or Wo Contrast  Result Date: 06/21/2017 CLINICAL DATA:  Chest pain radiating to RIGHT neck for 3 days, shortness  of breath, dizziness, non-small-cell lung cancer, cardiomyopathy, asthma, hypertension EXAM: CT ANGIOGRAPHY CHEST WITH CONTRAST TECHNIQUE: Multidetector CT imaging of the chest was performed using the standard protocol during bolus administration of intravenous contrast. Multiplanar CT image reconstructions and MIPs were obtained to evaluate the vascular anatomy. CONTRAST:  167mL ISOVUE-370 IOPAMIDOL (ISOVUE-370) INJECTION 76% IV COMPARISON:  05/11/2017 FINDINGS: Cardiovascular: Aorta normal caliber without aneurysm or dissection. Heart unremarkable. No pericardial effusion. Pulmonary arteries adequately opacified and patent. No evidence of pulmonary embolism. RIGHT jugular line with tip in SVC. Questionable filling defect versus artifact from unopacified blood at tip of RIGHT jugular line, unable to exclude a small amount of thrombus associated with the line tip. Mediastinum/Nodes: Enlarged RIGHT paratracheal node versus tumor invasion of the RIGHT mediastinum, abutting the trachea, abnormal soft tissue 2.7 x 2.6 cm image 28, slightly increased. No additional mediastinal adenopathy. Abnormal enlarged RIGHT axillary lymph node 2.7 x 2.6 cm image 21 previously 2.6 x 2.2 cm. No additional thoracic adenopathy. Esophagus unremarkable. Base of cervical region normal appearance. Lungs/Pleura: Volume loss and opacity of the medial RIGHT upper lobe and within the posteromedial RIGHT lower lobe question reflecting post radiation therapy changes. Minimal RIGHT pleural effusion. Mild scattered subpleural interstitial changes in the RIGHT middle lobe. Minimal atelectasis at LEFT base. Vague nodule LEFT lower lobe 7 mm diameter image 81 previously 6 mm. No definite new pulmonary nodules. Upper Abdomen: Visualized upper abdomen unremarkable. Musculoskeletal: No acute osseous findings. Review of the MIP images confirms the above findings. IMPRESSION: No evidence pulmonary embolism. Probable posttherapy changes with volume loss and  scarring in the medial RIGHT lung. Slightly increased adenopathy versus tumor at RIGHT paratracheal region. Slight increase in size of RIGHT axillary adenopathy. Little change in nodular density LEFT lower lobe, currently 7 mm, previously 6 mm. Electronically Signed   By: Lavonia Dana M.D.   On: 06/21/2017 18:01    Procedures Procedures (including critical care time)  Medications Ordered in ED Medications  sodium  chloride 0.9 % injection (not administered)  naproxen (NAPROSYN) tablet 500 mg (not administered)  HYDROcodone-acetaminophen (NORCO/VICODIN) 5-325 MG per tablet 1 tablet (not administered)  iopamidol (ISOVUE-370) 76 % injection (100 mLs  Contrast Given 06/21/17 1732)     Initial Impression / Assessment and Plan / ED Course  I have reviewed the triage vital signs and the nursing notes.  Pertinent labs & imaging results that were available during my care of the patient were reviewed by me and considered in my medical decision making (see chart for details).  Clinical Course as of Jun 22 1847  Tue Jun 21, 2017  6314 Results from the ER workup discussed with the patient face to face and all questions answered to the best of my ability.  CT scan reviewed - no concerning findings. CT Angio Chest PE W and/or Wo Contrast [AN]  9702 Advised patient to take Percocet and NSAIDs.  She will see her cancer doctor next week.  Strict ER return precautions have been discussed.  I do not see indication to get a second troponin as the concerns for ACS for this constant pain x 2+ days is low.  [AN]    Clinical Course User Index [AN] Varney Biles, MD    52 year old female comes in with chief complaint of chest pain, neck pain. Patient has history of non-small cell carcinoma of the lung, CHF.  Her pain has been constant over the past 3 days, getting worse.  There is a pleuritic component to the pain.  Patient is also having neck pain that is positional, but it is radiating up from the chest.   Patient has a right-sided port for chemotherapy.  Clinical concerns are high for PE, cancer related pain, thrombosis.  CT scan of the chest ordered.  X-ray of the lungs show that there is concerns for thrombosis in the IJ as well, therefore CT soft tissue neck has been added.  Final Clinical Impressions(s) / ED Diagnoses   Final diagnoses:  Atypical chest pain  Precordial chest pain    ED Discharge Orders    None         Varney Biles, MD 06/21/17 1849

## 2017-06-27 ENCOUNTER — Inpatient Hospital Stay: Payer: Medicaid Other

## 2017-06-27 ENCOUNTER — Other Ambulatory Visit: Payer: Medicaid Other

## 2017-06-27 VITALS — BP 126/79 | HR 105 | Temp 98.2°F | Resp 18

## 2017-06-27 DIAGNOSIS — C778 Secondary and unspecified malignant neoplasm of lymph nodes of multiple regions: Secondary | ICD-10-CM

## 2017-06-27 DIAGNOSIS — C3491 Malignant neoplasm of unspecified part of right bronchus or lung: Secondary | ICD-10-CM

## 2017-06-27 DIAGNOSIS — Z5112 Encounter for antineoplastic immunotherapy: Secondary | ICD-10-CM | POA: Diagnosis not present

## 2017-06-27 DIAGNOSIS — C792 Secondary malignant neoplasm of skin: Secondary | ICD-10-CM

## 2017-06-27 LAB — CMP (CANCER CENTER ONLY)
ALBUMIN: 3.3 g/dL — AB (ref 3.5–5.0)
ALK PHOS: 100 U/L (ref 40–150)
ALT: 10 U/L (ref 0–55)
AST: 12 U/L (ref 5–34)
Anion gap: 10 (ref 3–11)
BUN: 9 mg/dL (ref 7–26)
CHLORIDE: 103 mmol/L (ref 98–109)
CO2: 27 mmol/L (ref 22–29)
CREATININE: 0.83 mg/dL (ref 0.60–1.10)
Calcium: 9.6 mg/dL (ref 8.4–10.4)
GFR, Estimated: >60 mL/min (ref >60)
GLUCOSE: 230 mg/dL — AB (ref 70–140)
Potassium: 4.1 mmol/L (ref 3.5–5.1)
SODIUM: 140 mmol/L (ref 136–145)
Total Bilirubin: 0.2 mg/dL (ref 0.2–1.2)
Total Protein: 7.7 g/dL (ref 6.4–8.3)

## 2017-06-27 LAB — CBC WITH DIFFERENTIAL (CANCER CENTER ONLY)
Basophils Absolute: 0 10*3/uL (ref 0.0–0.1)
Basophils Relative: 1 %
EOS ABS: 0.6 10*3/uL — AB (ref 0.0–0.5)
Eosinophils Relative: 9 %
HCT: 30 % — ABNORMAL LOW (ref 34.8–46.6)
HEMOGLOBIN: 9.6 g/dL — AB (ref 11.6–15.9)
LYMPHS ABS: 1.3 10*3/uL (ref 0.9–3.3)
Lymphocytes Relative: 22 %
MCH: 29.1 pg (ref 25.1–34.0)
MCHC: 32 g/dL (ref 31.5–36.0)
MCV: 90.9 fL (ref 79.5–101.0)
MONO ABS: 0.3 10*3/uL (ref 0.1–0.9)
MONOS PCT: 5 %
NEUTROS PCT: 63 %
Neutro Abs: 3.8 10*3/uL (ref 1.5–6.5)
Platelet Count: 293 10*3/uL (ref 145–400)
RBC: 3.3 MIL/uL — ABNORMAL LOW (ref 3.70–5.45)
RDW: 13.4 % (ref 11.2–14.5)
WBC Count: 6 10*3/uL (ref 3.9–10.3)

## 2017-06-27 LAB — RETICULOCYTES
RBC.: 3.3 MIL/uL — ABNORMAL LOW (ref 3.70–5.45)
RETIC CT PCT: 2.1 % (ref 0.7–2.1)
Retic Count, Absolute: 69.3 10*3/uL (ref 33.7–90.7)

## 2017-06-27 MED ORDER — SODIUM CHLORIDE 0.9 % IV SOLN
240.0000 mg | Freq: Once | INTRAVENOUS | Status: AC
  Administered 2017-06-27: 240 mg via INTRAVENOUS
  Filled 2017-06-27: qty 24

## 2017-06-27 MED ORDER — SODIUM CHLORIDE 0.9 % IV SOLN
Freq: Once | INTRAVENOUS | Status: AC
  Administered 2017-06-27: 09:00:00 via INTRAVENOUS

## 2017-06-27 MED ORDER — HEPARIN SOD (PORK) LOCK FLUSH 100 UNIT/ML IV SOLN
500.0000 [IU] | Freq: Once | INTRAVENOUS | Status: AC | PRN
  Administered 2017-06-27: 500 [IU]
  Filled 2017-06-27: qty 5

## 2017-06-27 MED ORDER — SODIUM CHLORIDE 0.9% FLUSH
10.0000 mL | INTRAVENOUS | Status: DC | PRN
  Administered 2017-06-27: 10 mL
  Filled 2017-06-27: qty 10

## 2017-06-27 NOTE — Patient Instructions (Signed)
[facility] Discharge Instructions for Patients Receiving Chemotherapy  Today you received the following chemotherapy agent:  Nivolumab.  To help prevent nausea and vomiting after your treatment, we encourage you to take your nausea medication as prescribed.   If you develop nausea and vomiting that is not controlled by your nausea medication, call the clinic.   BELOW ARE SYMPTOMS THAT SHOULD BE REPORTED IMMEDIATELY:  *FEVER GREATER THAN 100.5 F  *CHILLS WITH OR WITHOUT FEVER  NAUSEA AND VOMITING THAT IS NOT CONTROLLED WITH YOUR NAUSEA MEDICATION  *UNUSUAL SHORTNESS OF BREATH  *UNUSUAL BRUISING OR BLEEDING  TENDERNESS IN MOUTH AND THROAT WITH OR WITHOUT PRESENCE OF ULCERS  *URINARY PROBLEMS  *BOWEL PROBLEMS  UNUSUAL RASH Items with * indicate a potential emergency and should be followed up as soon as possible.  Feel free to call the clinic should you have any questions or concerns. The clinic phone number is [phone].  Please show the [facility] at check-in to the Emergency Department and triage nurse.

## 2017-07-08 NOTE — Progress Notes (Signed)
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  Current treatment:  Nivolumab q2 weeks  PreviousTreatment -Concurrent Chemo-radiation with Carboplatin/Taxol. Completed RT on 02/13/2016 and has then completed 2 cycles of carboplatin + taxol. -Started Maintenance Durvalumab from  05/24/2016 q2weeks, switched to Nivolumab q2weeks on 12/13/16 once metastatic disease noted -s/p palliative RT to rt sided Subcutaneous metastatic mass.    HISTORY OF PRESENTING ILLNESS: Plz see my previous note for details on initial presentation.  INTERVAL HISTORY   Marilyn [name] Marilyn [name] presents to the office today for followup of her metastatic lung cancer. The patient's last visit with [place] was on 06/13/17. The pt reports that she is doing well overall.   The pt reports that upon her recent ED visit, she presented with chest wall pain in her upper left chest. She had a CT Angio Chest and CT Soft Tissue Neck; the results of which are recorded below.  She also notes that her existing skin rash is persisting to itch. She has used her anti-fungal medication, and has done her best to keep the area dry. She notes that her rash is in the skin folds of her groin.  She notes that her anti-fungal medication was not given as a powder, as ordered, but was given as a pill.   She notes that she is out of Miralax.  Of note since the patient's last visit, pt has had a CT Angio Chest completed on 06/21/17 with results revealing No evidence pulmonary embolism. Probable posttherapy changes with volume loss and scarring in the medial RIGHT lung. Slightly increased adenopathy versus tumor at RIGHT paratracheal region. Slight increase in size of RIGHT axillary adenopathy. Little  change in nodular density LEFT lower lobe, currently 7 mm, previously 6 mm.  A CT Soft Tissue Neck was also completed on 06/21/17 revealing 1. No acute abnormality identified in the neck. 2. Resolved cervical lymphadenopathy  Lab results today (07/11/17) of CBC, CMP, and Reticulocytes is as follows: all values are WNL except for RBC at 3.26, Hgb at 9.5, HCT 29.4, Glucose at 260, Albumin at 3.0.  On review of systems, pt reports some belly pain over the last two days, moving her bowels well, persisting inguinal skin rash, and denies new skin rashes, no changes in breathing, vaginal itching, and any other symptoms.   MEDICAL HISTORY:  Past Medical History:  Diagnosis Date  . Arthritis   . Asthma   . Cardiomyopathy ([place])   . CHF (congestive heart failure) ([place])   . Diabetes mellitus    Type II  . Hemorrhoids   . Hypercholesteremia   . Hypertension   . Hypothyroidism   . Lung cancer (HCC)    Lung, Mets to skin- right flank  . 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 Obese   . Pneumonia 2017  . Tachycardia     SURGICAL HISTORY: Past Surgical History:  Procedure Laterality Date  . CESAREAN SECTION    . COLONOSCOPY    . I/D  arm Right 2013  . IR FLUORO GUIDE PORT INSERTION RIGHT  02/18/2017  . IR GENERIC HISTORICAL  01/08/2016   IR US GUIDE VASC ACCESS RIGHT 01/08/2016 [name], DO [facility]  . IR GENERIC HISTORICAL  01/08/2016   IR FLUORO GUIDE CV  LINE RIGHT 01/08/2016 [name], DO [facility]  . IR US GUIDE VASC ACCESS RIGHT  02/18/2017  . MULTIPLE EXTRACTIONS WITH ALVEOLOPLASTY N/A 03/25/2017   Procedure: MULTIPLE EXTRACTION WITH ALVEOLOPLASTY (teeth #s four, six, seven, eight, nine, ten, eleven, one, twenty-three, twenty-four, twenty-five, twenty-six, twenty-nine, thirty);  Surgeon: [name], [name];  Location: [facility];  Service: Oral Surgery;  Laterality: N/A;  . TUBAL LIGATION    . US guided core needle biopsy     of right lower neck/supraclavicular lymph nodes     SOCIAL HISTORY: Social History   Socioeconomic History  . Marital status: Divorced    Spouse name: Not on file  . Number of children: Not on file  . Years of education: Not on file  . Highest education level: Not on file  Occupational History  . Not on file  Social Needs  . Financial resource strain: Not on file  . Food insecurity:    Worry: Not on file    Inability: Not on file  . Transportation needs:    Medical: Not on file    Non-medical: Not on file  Tobacco Use  . Smoking status: Never Smoker  . Smokeless tobacco: Never Used  Substance and Sexual Activity  . Alcohol use: No  . Drug use: No  . Sexual activity: Not Currently  Lifestyle  . Physical activity:    Days per week: Not on file    Minutes per session: Not on file  . Stress: Not on file  Relationships  . Social connections:    Talks on phone: Not on file    Gets together: Not on file    Attends religious service: Not on file    Active member of club or organization: Not on file    Attends meetings of clubs or organizations: Not on file    Relationship status: Not on file  . Intimate partner violence:    Fear of current or ex partner: Not on file    Emotionally abused: Not on file    Physically abused: Not on file    Forced sexual activity: Not on file  Other Topics Concern  . Not on file  Social History Narrative   No bird or mold exposure. No recent travel.    FAMILY HISTORY: Family History  Problem Relation Age of Onset  . Heart failure Mother   . Hypertension Mother   . Cancer Neg Hx   . Rheumatologic disease Neg Hx     ALLERGIES:  has No Known Allergies.  MEDICATIONS:  Current Outpatient Medications  Medication Sig Dispense Refill  . aspirin EC 81 MG tablet Take 1 tablet (81 mg total) by mouth daily. 60 tablet 2  . carvedilol (COREG) 6.25 MG tablet Take 1 tablet (6.25 mg total) by mouth 2 (two) times daily. (Patient taking differently: Take 6.25 mg by mouth daily. ) 180 tablet 3   . DULoxetine (CYMBALTA) 60 MG capsule Take 1 capsule (60 mg total) by mouth daily. 30 capsule 2  . insulin aspart (NOVOLOG) 100 UNIT/ML FlexPen As per sliding scale instructions (Patient taking differently: Inject 0-10 Units into the skin 3 (three) times daily. As per sliding scale instructions) 15 mL 2  . Insulin Degludec (TRESIBA FLEXTOUCH) 200 UNIT/ML SOPN Inject 100 Units into the skin daily before breakfast.     . levothyroxine (SYNTHROID, LEVOTHROID) 150 MCG tablet Take 1 tablet (150 mcg total) by mouth daily before breakfast. 30 tablet 2  . lidocaine-prilocaine (EMLA) cream Apply 1 application  topically as needed. (Patient taking differently: Apply 1 application topically as needed (access port). ) 30 g 6  . loratadine (CLARITIN) 10 MG tablet Take 10 mg by mouth daily.    Marilyn [name] losartan (COZAAR) 25 MG tablet Take 1 tablet (25 mg total) by mouth daily. 90 tablet 3  . losartan (COZAAR) 25 MG tablet Take 25 mg by mouth daily.    . methocarbamol (ROBAXIN) 500 MG tablet Take 1 tablet (500 mg total) by mouth every 8 (eight) hours as needed for muscle spasms. 8 tablet 0  . miconazole (MICOTIN) 2 % powder Apply topically 2 (two) times daily. Apply to area of rash over the perineum (Patient not taking: Reported on 06/21/2017) 85 g 1  . naproxen (NAPROSYN) 500 MG tablet Take 1 tablet (500 mg total) by mouth 2 (two) times daily with a meal. 20 tablet 0  . omeprazole (PRILOSEC) 20 MG capsule Take 1 capsule (20 mg total) by mouth daily. 30 capsule 1  . oxyCODONE-acetaminophen (PERCOCET) 5-325 MG tablet Take 1 tablet by mouth every 6 (six) hours as needed. (Patient taking differently: Take 1 tablet by mouth every 6 (six) hours as needed for moderate pain or severe pain. ) 30 tablet 0  . polyethylene glycol (MIRALAX) packet Take 17 g by mouth daily. 30 each 1  . senna-docusate (SENNA S) 8.6-50 MG tablet Take 2 tablets by mouth 2 (two) times daily. May reduce to 2 tab po HS as needed once regular BM estabished  (Patient taking differently: Take 2 tablets by mouth 2 (two) times daily as needed (FOR CONSTIPATION). =) 60 tablet 1  . traZODone (DESYREL) 50 MG tablet Take 1-2 tablets (50-100 mg total) by mouth at bedtime as needed for sleep. 60 tablet 0   No current facility-administered medications for this visit.    Facility-Administered Medications Ordered in Other Visits  Medication Dose Route Frequency Provider Last Rate Last Dose  . sodium chloride 0.9 % injection 10 mL  10 mL Intravenous PRN [name], MD   10 mL at 05/24/16 1252    REVIEW OF SYSTEMS:    10 Point review of Systems was done is negative except as noted above.   PHYSICAL EXAMINATION: ECOG PERFORMANCE STATUS: 1 - Symptomatic but completely ambulatory  . Vitals:   07/11/17 0927  BP: 118/83  Pulse: (!) 118  Resp: 18  Temp: 99.1 F (37.3 C)  SpO2: 96%   Filed Weights   07/11/17 0927  Weight: 279 lb 4.8 oz (126.7 kg)   .Body mass index is 40.65 kg/m.  GENERAL:alert, in no acute distress and comfortable SKIN: no acute rashes, no significant lesions EYES: conjunctiva are pink and non-injected, sclera anicteric OROPHARYNX: MMM, no exudates, no oropharyngeal erythema or ulceration NECK: supple, no JVD LYMPH:  no palpable lymphadenopathy in the cervical, axillary or inguinal regions LUNGS: clear to auscultation b/l with normal respiratory effort HEART: regular rate & rhythm ABDOMEN:  normoactive bowel sounds , non tender, not distended. Extremity: no pedal edema PSYCH: alert & oriented x 3 with fluent speech NEURO: no focal motor/sensory deficits     LABORATORY DATA: .  Marilyn [name] CBC Latest Ref Rng & Units 07/11/2017 06/27/2017 06/21/2017  WBC 3.9 - 10.3 K/uL 6.6 6.0 6.6  Hemoglobin 12.0 - 15.0 g/dL - - 9.7(L)  Hematocrit 34.8 - 46.6 % 29.4(L) 30.0(L) 30.6(L)  Platelets 145 - 400 K/uL 249 293 326  HGB 9.5  . CMP Latest Ref Rng & Units 07/11/2017 06/27/2017 06/21/2017  Glucose 70 -  140 mg/dL 260(H) 230(H) 137(H)   BUN 7 - 26 mg/dL '10 9 10  ' Creatinine 0.60 - 1.10 mg/dL 0.83 0.83 0.67  Sodium 136 - 145 mmol/L 139 140 140  Potassium 3.5 - 5.1 mmol/L 3.8 4.1 3.5  Chloride 98 - 109 mmol/L 104 103 102  CO2 22 - 29 mmol/L '25 27 28  ' Calcium 8.4 - 10.4 mg/dL 8.8 9.6 8.5(L)  Total Protein 6.4 - 8.3 g/dL 7.4 7.7 -  Total Bilirubin 0.2 - 1.2 mg/dL 0.3 0.2 -  Alkaline Phos 40 - 150 U/L 103 100 -  AST 5 - 34 U/L 12 12 -  ALT 0 - 55 U/L 12 10 -   Component     Latest Ref Rng & Units 06/13/2017  TSH     0.308 - 3.960 uIU/mL 12.633 (H)               RADIOGRAPHIC STUDIES:  .Dg Chest 2 View  Result Date: 06/21/2017 CLINICAL DATA:  Chest pain radiating into the right neck EXAM: CHEST - 2 VIEW COMPARISON:  05/11/2017 FINDINGS: Cardiac shadow is within normal limits. Right chest wall port is noted with the catheter tip in the mid superior vena cava. Postradiation changes are seen in the right upper and lower lobes, stable from prior CT examination. Left lung remains clear. No sizable effusion is noted. No bony abnormality is seen. IMPRESSION: Chronic changes in the right lung.  No acute abnormality is noted. When compared with the prior CT examination there is some filling defect within the superior vena cava which may represent thrombus. The history of right neck pain raises suspicion for right jugular thrombus. Ultrasound may be helpful for further evaluation. Electronically Signed   By: Inez Catalina M.D.   On: 06/21/2017 16:40   Dg Elbow Complete Left  Result Date: 06/11/2017 CLINICAL DATA:  Fall.  Left arm pain EXAM: LEFT ELBOW - COMPLETE 3+ VIEW COMPARISON:  None. FINDINGS: There is no evidence of fracture, dislocation, or joint effusion. There is no evidence of arthropathy or other focal bone abnormality. Soft tissues are unremarkable. IMPRESSION: Negative. Electronically Signed   By: Rolm Baptise M.D.   On: 06/11/2017 12:18   Dg Ankle Complete Left  Result Date: 06/11/2017 CLINICAL DATA:  Fall, left  ankle pain EXAM: LEFT ANKLE COMPLETE - 3+ VIEW COMPARISON:  None. FINDINGS: Slight lateral soft tissue swelling. No fracture, subluxation or dislocation. Plantar calcaneal spur present. IMPRESSION: No acute bony abnormality. Electronically Signed   By: Rolm Baptise M.D.   On: 06/11/2017 12:19   Ct Soft Tissue Neck W Contrast  Result Date: 06/21/2017 CLINICAL DATA:  Chest pain radiating into the right neck for 3 days. History of lung cancer. EXAM: CT NECK WITH CONTRAST TECHNIQUE: Multidetector CT imaging of the neck was performed using the standard protocol following the bolus administration of intravenous contrast. CONTRAST:  152m ISOVUE-370 IOPAMIDOL (ISOVUE-370) INJECTION 76% COMPARISON:  PET-CT 01/12/2016.  Neck CT 12/27/2015. FINDINGS: The study is mildly motion degraded. Pharynx and larynx: No evidence of pharyngeal mass or swelling. Unremarkable larynx allowing for mild motion artifact. Salivary glands: No inflammation, mass, or stone. Thyroid: Unremarkable. Lymph nodes: Right-sided cervical lymphadenopathy on the prior studies has resolved. No new enlarged or suspicious cervical lymph nodes. Vascular: Partially visualized right internal jugular central venous catheter. Major vascular structures of the neck, including the right internal jugular vein, are patent. The internal jugular veins in the upper chest and brachiocephalic veins are obscured by beam hardening. Limited intracranial:  Unremarkable. Visualized orbits: Unremarkable. Mastoids and visualized paranasal sinuses: Clear. Skeleton: Unchanged small sclerotic foci in the posterior elements at C2 and C3, likely bone islands. Mild lower cervical spondylosis. Upper chest: Reported separately. Other: None. IMPRESSION: 1. No acute abnormality identified in the neck. 2. Resolved cervical lymphadenopathy. Electronically Signed   By: Logan Bores M.D.   On: 06/21/2017 18:02   Ct Angio Chest Pe W And/or Wo Contrast  Result Date: 06/21/2017 CLINICAL  DATA:  Chest pain radiating to RIGHT neck for 3 days, shortness of breath, dizziness, non-small-cell lung cancer, cardiomyopathy, asthma, hypertension EXAM: CT ANGIOGRAPHY CHEST WITH CONTRAST TECHNIQUE: Multidetector CT imaging of the chest was performed using the standard protocol during bolus administration of intravenous contrast. Multiplanar CT image reconstructions and MIPs were obtained to evaluate the vascular anatomy. CONTRAST:  135m ISOVUE-370 IOPAMIDOL (ISOVUE-370) INJECTION 76% IV COMPARISON:  05/11/2017 FINDINGS: Cardiovascular: Aorta normal caliber without aneurysm or dissection. Heart unremarkable. No pericardial effusion. Pulmonary arteries adequately opacified and patent. No evidence of pulmonary embolism. RIGHT jugular line with tip in SVC. Questionable filling defect versus artifact from unopacified blood at tip of RIGHT jugular line, unable to exclude a small amount of thrombus associated with the line tip. Mediastinum/Nodes: Enlarged RIGHT paratracheal node versus tumor invasion of the RIGHT mediastinum, abutting the trachea, abnormal soft tissue 2.7 x 2.6 cm image 28, slightly increased. No additional mediastinal adenopathy. Abnormal enlarged RIGHT axillary lymph node 2.7 x 2.6 cm image 21 previously 2.6 x 2.2 cm. No additional thoracic adenopathy. Esophagus unremarkable. Base of cervical region normal appearance. Lungs/Pleura: Volume loss and opacity of the medial RIGHT upper lobe and within the posteromedial RIGHT lower lobe question reflecting post radiation therapy changes. Minimal RIGHT pleural effusion. Mild scattered subpleural interstitial changes in the RIGHT middle lobe. Minimal atelectasis at LEFT base. Vague nodule LEFT lower lobe 7 mm diameter image 81 previously 6 mm. No definite new pulmonary nodules. Upper Abdomen: Visualized upper abdomen unremarkable. Musculoskeletal: No acute osseous findings. Review of the MIP images confirms the above findings. IMPRESSION: No evidence  pulmonary embolism. Probable posttherapy changes with volume loss and scarring in the medial RIGHT lung. Slightly increased adenopathy versus tumor at RIGHT paratracheal region. Slight increase in size of RIGHT axillary adenopathy. Little change in nodular density LEFT lower lobe, currently 7 mm, previously 6 mm. Electronically Signed   By: MLavonia DanaM.D.   On: 06/21/2017 18:01   Dg Shoulder Left  Result Date: 06/11/2017 CLINICAL DATA:  Pain after fall EXAM: LEFT SHOULDER - 2+ VIEW COMPARISON:  None. FINDINGS: There is no evidence of fracture or dislocation. There is no evidence of arthropathy or other focal bone abnormality. Soft tissues are unremarkable. IMPRESSION: Negative. Electronically Signed   By: DDorise BullionIII M.D   On: 06/11/2017 12:18   Dg Knee Complete 4 Views Left  Result Date: 06/11/2017 CLINICAL DATA:  Fall.  Left leg pain EXAM: LEFT KNEE - COMPLETE 4+ VIEW COMPARISON:  None. FINDINGS: No evidence of fracture, dislocation, or joint effusion. No evidence of arthropathy or other focal bone abnormality. Soft tissues are unremarkable. IMPRESSION: Negative. Electronically Signed   By: KRolm BaptiseM.D.   On: 06/11/2017 12:19   Dg Humerus Left  Result Date: 06/11/2017 CLINICAL DATA:  Left arm pain after a fall. EXAM: LEFT HUMERUS - 2+ VIEW COMPARISON:  Shoulder and elbow films of same date. FINDINGS: No acute fracture or dislocation. IMPRESSION: No acute osseous abnormality. Electronically Signed   By: KAdria DevonD.  On: 06/11/2017 12:53     ASSESSMENT & PLAN:   52 y.o.  African-American female with history of hypertension, diabetes, asthma, dysuria mass and morbid obesity with    #1 h/o Lung Adenocarcinoma Rt sided atleast Stage IIIB (on diagnosis) with large right paratracheal mass with mediastinal adenopathy that appears to have grown significantly over the last 6-7 months and rt supraclavicular LN +.  -Noted to have a small mass in the left adrenal on CT but PET/CT neg  for metastatic disease. Patient has been a lifelong nonsmoker -MRI of the brain was negative for any metastatic disease. At diagnosis. -High PDL1 expression (90%) on [facility] One Neg for EGFR, ALK, ROS-1 and BRAF mutations. -Patient completed her planned definitive chemoradiation with carbo Taxol on 02/13/2016. No prohibitive toxicities other than some grade 1 skin desquamation and some grade 1-2 radiation esophagitis. She has subsequently received 2 out of 2 planned dose of carboplatin + Taxol. -Patient has completed 11 doses of maintenance   -CTA chest 10/29/2016  no evidence of lung cancer progression in the chest. No PE -CT abd/pelvis-10/29/2016  Interval 5.0 x 5.0 x 4.7 cm mass in the right lateral subcutaneous fat at the level of the mid abdomen. This has CT features compatible with a metastasis or primary neoplasm. -Continue IV Nivolumab q2weeks - plz schedule next 4 doses Labs q2weeks RTC with Dr Irene Limbo in 4 weeks. CT C/A/P (05/12/2017): Interval development of right axillary, right external iliac and right inguinal adenopathy. Suspicious for nodal metastasis. 2. Decrease in size of right paratracheal adenopathy. 3. Decrease in size and enhancement associated with right lateral body wall lesion. 4. New small nonspecific pulmonary nodule in the left lower lobe measuring 6 mm. 5. Stable appearance of changes secondary to external beam radiation within the right lung    #2 Now with Metastatic lung adenocarcinoma with isolated metastases in the right lateral subcutaneous fat at the level of the midabdomen. she received maintenance Durvalumab from  05/24/2016 q2weeks, switched to Nivolumab q2weeks on 12/13/16 in the setting of Biopsy proven isolated metastatic disease. Given the tumor is strongly PDL1 positive and patient has only isolated metastatic disease was treated with palliative RT to metastases with significant improvement.  Plan - Continue when necessary Percocet for pain management    -Discussed most recent 05/11/17 CT scan results in details; development of right axillary, right external iliac, and right inguinal adenopathy suspicious for nodal metastasis. Also decreased size and enhancement of right lateral body wall lesion. Lung stable.  -continue Nivolumab at this time.in the absence of symptomatic progression. Will need to consider palliative RT to symptomatic lesions. -Discussed pt labwork today 07/11/17; blood counts and chemistries are stable. Anemia stable with Hgb at 9.5.  -No prohibitive toxicities; continue Nivolumab  -Discussed most recent 06/21/17 CT Angio Chest and CT Soft Tissue Neck which revealed slow growth of lymph nodes in the right axilla.  -We will refill Miralax today.  -Repeat PET scan in 3 weeks and will consider radiation vs a different chemotherapy/immunotherapy.  -Will see pt back in 3-4 weeks   #3 h/o radiation pneumonitis - no new symptoms. Does has some cough with exertion but this has improved.  #4 Improving fatigue (grade 1)  -completed outpatient PT program - optimize DM2 management with PCP. Reported HgA1c 10. - improved fatigue with levothyroxine compliance with near normalization of FT4 levels.  - given TSH of 12 -- increased Levolthyroxine from 14mg to 1561m  ---will likely need larger dose but in the setting of  baseline tachycardia we are gradually increasing dose to avoid ppt cardiac arrhythmias.   #5 normocytic anemia related to chemotherapy -resolving and stable Hg at 9.5  #6 Left chest wall pain due to costochondritis - mx with pain medications. CTA x 3 neg for PE. No significant pain at this time.  #7 Sinus tachycardia - on BB per cards , no PE on CTA x 3 , -Still having sinus tachycardia . Partly from deconditioning and anemia. Seeing Dr. Debara Pickett from cardiology  -Continues to be on Coreg  #8 neoplasm related pain is currently controlled without significant medications. Clinically likely has sleep apnea . Would need to be  careful with sedative medications . 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 Gave her topical and oral antifungals to help with this.  -We will prescribe Clotrimazole for her fungal infection.  -Recommend water-insoluble Vitamin A and D ointment with anti fungal medication.   #12 Insomnia related to son's recent demise -trazodone 50-1050mpo HS prn   -continue Nivolumab q2week-- plz schedule 3 doses -PET/CT in 3 weeks -labs q2weeks -RTC with Dr KaIrene Limbon 4 weeks     All of the patients questions were answered with apparent satisfaction. The patient knows to call the clinic with any problems, questions or concerns.  . The total time spent in the appointment was 20 minutes and more than 50% was on counseling and direct patient cares.    GaSullivan LoneD MSMiddlevilleAHIVMS SCPhs Indian Hospital RosebudTSt Anthonys Hospitalematology/Oncology Physician CoSouth Cle [place](Office):       [phone] cell):  [phone]):           [phone] document serves as a record of services personally performed by GaSullivan LoneMD. It was created on his behalf by ScBaldwin Jamaicaa trained medical scribe. The creation of this record is based on the scribe's personal observations and the provider's statements to them.   .I have reviewed the above documentation for accuracy and completeness, and I agree with the above. .GBrunetta GeneraD MS

## 2017-07-11 ENCOUNTER — Inpatient Hospital Stay: Payer: Medicaid Other

## 2017-07-11 ENCOUNTER — Inpatient Hospital Stay: Payer: Medicaid Other | Attending: Hematology | Admitting: Hematology

## 2017-07-11 VITALS — BP 118/83 | HR 118 | Temp 99.1°F | Resp 18 | Ht 69.5 in | Wt 279.3 lb

## 2017-07-11 DIAGNOSIS — C3491 Malignant neoplasm of unspecified part of right bronchus or lung: Secondary | ICD-10-CM

## 2017-07-11 DIAGNOSIS — B372 Candidiasis of skin and nail: Secondary | ICD-10-CM

## 2017-07-11 DIAGNOSIS — T451X5A Adverse effect of antineoplastic and immunosuppressive drugs, initial encounter: Secondary | ICD-10-CM | POA: Insufficient documentation

## 2017-07-11 DIAGNOSIS — C792 Secondary malignant neoplasm of skin: Secondary | ICD-10-CM

## 2017-07-11 DIAGNOSIS — M129 Arthropathy, unspecified: Secondary | ICD-10-CM

## 2017-07-11 DIAGNOSIS — E7800 Pure hypercholesterolemia, unspecified: Secondary | ICD-10-CM | POA: Diagnosis not present

## 2017-07-11 DIAGNOSIS — R21 Rash and other nonspecific skin eruption: Secondary | ICD-10-CM | POA: Insufficient documentation

## 2017-07-11 DIAGNOSIS — Z7982 Long term (current) use of aspirin: Secondary | ICD-10-CM

## 2017-07-11 DIAGNOSIS — Z8701 Personal history of pneumonia (recurrent): Secondary | ICD-10-CM | POA: Diagnosis not present

## 2017-07-11 DIAGNOSIS — I509 Heart failure, unspecified: Secondary | ICD-10-CM | POA: Insufficient documentation

## 2017-07-11 DIAGNOSIS — Z452 Encounter for adjustment and management of vascular access device: Secondary | ICD-10-CM

## 2017-07-11 DIAGNOSIS — Z95828 Presence of other vascular implants and grafts: Secondary | ICD-10-CM

## 2017-07-11 DIAGNOSIS — G4700 Insomnia, unspecified: Secondary | ICD-10-CM | POA: Diagnosis not present

## 2017-07-11 DIAGNOSIS — E1140 Type 2 diabetes mellitus with diabetic neuropathy, unspecified: Secondary | ICD-10-CM | POA: Diagnosis not present

## 2017-07-11 DIAGNOSIS — G629 Polyneuropathy, unspecified: Secondary | ICD-10-CM | POA: Insufficient documentation

## 2017-07-11 DIAGNOSIS — Z5112 Encounter for antineoplastic immunotherapy: Secondary | ICD-10-CM | POA: Diagnosis not present

## 2017-07-11 DIAGNOSIS — E669 Obesity, unspecified: Secondary | ICD-10-CM | POA: Diagnosis not present

## 2017-07-11 DIAGNOSIS — J90 Pleural effusion, not elsewhere classified: Secondary | ICD-10-CM | POA: Insufficient documentation

## 2017-07-11 DIAGNOSIS — R590 Localized enlarged lymph nodes: Secondary | ICD-10-CM

## 2017-07-11 DIAGNOSIS — M479 Spondylosis, unspecified: Secondary | ICD-10-CM | POA: Diagnosis not present

## 2017-07-11 DIAGNOSIS — D6481 Anemia due to antineoplastic chemotherapy: Secondary | ICD-10-CM | POA: Insufficient documentation

## 2017-07-11 DIAGNOSIS — J45909 Unspecified asthma, uncomplicated: Secondary | ICD-10-CM | POA: Diagnosis not present

## 2017-07-11 DIAGNOSIS — R000 Tachycardia, unspecified: Secondary | ICD-10-CM | POA: Diagnosis not present

## 2017-07-11 DIAGNOSIS — Z794 Long term (current) use of insulin: Secondary | ICD-10-CM | POA: Diagnosis not present

## 2017-07-11 DIAGNOSIS — I129 Hypertensive chronic kidney disease with stage 1 through stage 4 chronic kidney disease, or unspecified chronic kidney disease: Secondary | ICD-10-CM | POA: Diagnosis not present

## 2017-07-11 DIAGNOSIS — M940 Chondrocostal junction syndrome [Tietze]: Secondary | ICD-10-CM | POA: Diagnosis not present

## 2017-07-11 DIAGNOSIS — E039 Hypothyroidism, unspecified: Secondary | ICD-10-CM | POA: Insufficient documentation

## 2017-07-11 LAB — RETICULOCYTES
RBC.: 3.26 MIL/uL — ABNORMAL LOW (ref 3.70–5.45)
RETIC COUNT ABSOLUTE: 55.4 10*3/uL (ref 33.7–90.7)
Retic Ct Pct: 1.7 % (ref 0.7–2.1)

## 2017-07-11 LAB — CBC WITH DIFFERENTIAL (CANCER CENTER ONLY)
BASOS PCT: 0 %
Basophils Absolute: 0 10*3/uL (ref 0.0–0.1)
EOS ABS: 0.5 10*3/uL (ref 0.0–0.5)
Eosinophils Relative: 8 %
HEMATOCRIT: 29.4 % — AB (ref 34.8–46.6)
HEMOGLOBIN: 9.5 g/dL — AB (ref 11.6–15.9)
LYMPHS ABS: 1.1 10*3/uL (ref 0.9–3.3)
Lymphocytes Relative: 17 %
MCH: 29.1 pg (ref 25.1–34.0)
MCHC: 32.3 g/dL (ref 31.5–36.0)
MCV: 90.2 fL (ref 79.5–101.0)
MONOS PCT: 4 %
Monocytes Absolute: 0.3 10*3/uL (ref 0.1–0.9)
Neutro Abs: 4.6 10*3/uL (ref 1.5–6.5)
Neutrophils Relative %: 71 %
Platelet Count: 249 10*3/uL (ref 145–400)
RBC: 3.26 MIL/uL — ABNORMAL LOW (ref 3.70–5.45)
RDW: 13.2 % (ref 11.2–14.5)
WBC Count: 6.6 10*3/uL (ref 3.9–10.3)

## 2017-07-11 LAB — CMP (CANCER CENTER ONLY)
ALBUMIN: 3 g/dL — AB (ref 3.5–5.0)
ALK PHOS: 103 U/L (ref 40–150)
ALT: 12 U/L (ref 0–55)
AST: 12 U/L (ref 5–34)
Anion gap: 10 (ref 3–11)
BUN: 10 mg/dL (ref 7–26)
CALCIUM: 8.8 mg/dL (ref 8.4–10.4)
CHLORIDE: 104 mmol/L (ref 98–109)
CO2: 25 mmol/L (ref 22–29)
CREATININE: 0.83 mg/dL (ref 0.60–1.10)
GFR, Est AFR Am: >60 mL/min (ref >60)
GFR, Estimated: >60 mL/min (ref >60)
Glucose, Bld: 260 mg/dL — ABNORMAL HIGH (ref 70–140)
Potassium: 3.8 mmol/L (ref 3.5–5.1)
SODIUM: 139 mmol/L (ref 136–145)
Total Bilirubin: 0.3 mg/dL (ref 0.2–1.2)
Total Protein: 7.4 g/dL (ref 6.4–8.3)

## 2017-07-11 MED ORDER — POLYETHYLENE GLYCOL 3350 17 G PO PACK: 17.0000 g | PACK | Freq: Every day | ORAL | 1 refills | Status: DC

## 2017-07-11 MED ORDER — SODIUM CHLORIDE 0.9 % IJ SOLN
10.0000 mL | INTRAMUSCULAR | Status: DC | PRN
  Administered 2017-07-11: 10 mL via INTRAVENOUS
  Filled 2017-07-11: qty 10

## 2017-07-11 MED ORDER — ALTEPLASE 2 MG IJ SOLR
INTRAMUSCULAR | Status: AC
  Filled 2017-07-11: qty 2

## 2017-07-11 MED ORDER — SODIUM CHLORIDE 0.9 % IV SOLN
Freq: Once | INTRAVENOUS | Status: AC
  Administered 2017-07-11: 13:00:00 via INTRAVENOUS

## 2017-07-11 MED ORDER — ALTEPLASE 2 MG IJ SOLR
2.0000 mg | Freq: Once | INTRAMUSCULAR | Status: AC | PRN
  Administered 2017-07-11: 2 mg
  Filled 2017-07-11: qty 2

## 2017-07-11 MED ORDER — CLOTRIMAZOLE POWD: 5.0000 g | Freq: Two times a day (BID) | 0 refills | Status: DC

## 2017-07-11 MED ORDER — SODIUM CHLORIDE 0.9 % IV SOLN
240.0000 mg | Freq: Once | INTRAVENOUS | Status: AC
  Administered 2017-07-11: 240 mg via INTRAVENOUS
  Filled 2017-07-11: qty 24

## 2017-07-11 MED ORDER — HEPARIN SOD (PORK) LOCK FLUSH 100 UNIT/ML IV SOLN
500.0000 [IU] | Freq: Once | INTRAVENOUS | Status: AC | PRN
  Administered 2017-07-11: 500 [IU]
  Filled 2017-07-11: qty 5

## 2017-07-11 MED ORDER — SODIUM CHLORIDE 0.9% FLUSH
10.0000 mL | INTRAVENOUS | Status: DC | PRN
  Administered 2017-07-11: 10 mL
  Filled 2017-07-11: qty 10

## 2017-07-11 NOTE — Patient Instructions (Signed)
[facility] Discharge Instructions for Patients Receiving Chemotherapy  Today you received the following chemotherapy agents:  Opdivo (nivolumab)  To help prevent nausea and vomiting after your treatment, we encourage you to take your nausea medication as prescribed.   If you develop nausea and vomiting that is not controlled by your nausea medication, call the clinic.   BELOW ARE SYMPTOMS THAT SHOULD BE REPORTED IMMEDIATELY:  *FEVER GREATER THAN 100.5 F  *CHILLS WITH OR WITHOUT FEVER  NAUSEA AND VOMITING THAT IS NOT CONTROLLED WITH YOUR NAUSEA MEDICATION  *UNUSUAL SHORTNESS OF BREATH  *UNUSUAL BRUISING OR BLEEDING  TENDERNESS IN MOUTH AND THROAT WITH OR WITHOUT PRESENCE OF ULCERS  *URINARY PROBLEMS  *BOWEL PROBLEMS  UNUSUAL RASH Items with * indicate a potential emergency and should be followed up as soon as possible.  Feel free to call the clinic should you have any questions or concerns. The clinic phone number is [phone].  Please show the [facility] at check-in to the Emergency Department and triage nurse.

## 2017-07-11 NOTE — Progress Notes (Signed)
Ok to treat with pulse rate over 100 today/VO Dr. Areatha Keas RN PAC checked before connecting to IV fluids.  Flushes easily but no blood return.  Checked patient in multiple positions by two RN still no blood return.   Patient has had cath flo before and is agreeable to trying this again. 1200pm. No blood return after 35 minutes.

## 2017-07-12 ENCOUNTER — Telehealth: Payer: Self-pay

## 2017-07-12 NOTE — Telephone Encounter (Signed)
Spoke with patient concerning upcoming appointment. Per 4/8 los mailed letter and calender of these appointments.

## 2017-07-12 NOTE — Telephone Encounter (Signed)
Mailing a copy of the calender of upcoming appointment. Per 4/8 los

## 2017-07-15 ENCOUNTER — Ambulatory Visit (HOSPITAL_COMMUNITY): Payer: Medicaid Other | Attending: Cardiology

## 2017-07-15 ENCOUNTER — Other Ambulatory Visit: Payer: Self-pay

## 2017-07-15 DIAGNOSIS — R0600 Dyspnea, unspecified: Secondary | ICD-10-CM | POA: Diagnosis not present

## 2017-07-15 DIAGNOSIS — Z9221 Personal history of antineoplastic chemotherapy: Secondary | ICD-10-CM | POA: Insufficient documentation

## 2017-07-15 DIAGNOSIS — Z6841 Body Mass Index (BMI) 40.0 and over, adult: Secondary | ICD-10-CM | POA: Insufficient documentation

## 2017-07-15 DIAGNOSIS — Z8249 Family history of ischemic heart disease and other diseases of the circulatory system: Secondary | ICD-10-CM | POA: Insufficient documentation

## 2017-07-15 DIAGNOSIS — I509 Heart failure, unspecified: Secondary | ICD-10-CM | POA: Diagnosis not present

## 2017-07-15 DIAGNOSIS — E669 Obesity, unspecified: Secondary | ICD-10-CM | POA: Diagnosis not present

## 2017-07-15 DIAGNOSIS — I351 Nonrheumatic aortic (valve) insufficiency: Secondary | ICD-10-CM | POA: Diagnosis not present

## 2017-07-15 DIAGNOSIS — J45909 Unspecified asthma, uncomplicated: Secondary | ICD-10-CM | POA: Insufficient documentation

## 2017-07-15 DIAGNOSIS — I428 Other cardiomyopathies: Secondary | ICD-10-CM | POA: Diagnosis not present

## 2017-07-15 DIAGNOSIS — C792 Secondary malignant neoplasm of skin: Secondary | ICD-10-CM | POA: Diagnosis not present

## 2017-07-15 DIAGNOSIS — Z85118 Personal history of other malignant neoplasm of bronchus and lung: Secondary | ICD-10-CM | POA: Insufficient documentation

## 2017-07-15 DIAGNOSIS — E119 Type 2 diabetes mellitus without complications: Secondary | ICD-10-CM | POA: Insufficient documentation

## 2017-07-15 DIAGNOSIS — Z923 Personal history of irradiation: Secondary | ICD-10-CM | POA: Insufficient documentation

## 2017-07-17 MED ORDER — CLOTRIMAZOLE 1 % EX CREA: 1.0000 "application " | TOPICAL_CREAM | Freq: Two times a day (BID) | CUTANEOUS | 0 refills | Status: DC

## 2017-07-20 ENCOUNTER — Other Ambulatory Visit: Payer: Self-pay | Admitting: Medical

## 2017-07-20 MED ORDER — KETOCONAZOLE 2 % EX CREA: TOPICAL_CREAM | CUTANEOUS | 0 refills | Status: DC

## 2017-07-25 ENCOUNTER — Other Ambulatory Visit: Payer: Medicaid Other

## 2017-07-25 ENCOUNTER — Inpatient Hospital Stay: Payer: Medicaid Other

## 2017-07-25 VITALS — BP 119/84 | HR 107 | Temp 98.4°F | Resp 18

## 2017-07-25 DIAGNOSIS — C3491 Malignant neoplasm of unspecified part of right bronchus or lung: Secondary | ICD-10-CM

## 2017-07-25 DIAGNOSIS — C792 Secondary malignant neoplasm of skin: Secondary | ICD-10-CM

## 2017-07-25 DIAGNOSIS — C3492 Malignant neoplasm of unspecified part of left bronchus or lung: Secondary | ICD-10-CM

## 2017-07-25 LAB — CMP (CANCER CENTER ONLY)
ALBUMIN: 3.1 g/dL — AB (ref 3.5–5.0)
ALK PHOS: 89 U/L (ref 40–150)
ALT: 12 U/L (ref 0–55)
ANION GAP: 9 (ref 3–11)
AST: 14 U/L (ref 5–34)
BUN: 8 mg/dL (ref 7–26)
CO2: 25 mmol/L (ref 22–29)
Calcium: 8.8 mg/dL (ref 8.4–10.4)
Chloride: 106 mmol/L (ref 98–109)
Creatinine: 0.82 mg/dL (ref 0.60–1.10)
GFR, Est AFR Am: >60 mL/min (ref >60)
GFR, Estimated: >60 mL/min (ref >60)
GLUCOSE: 225 mg/dL — AB (ref 70–140)
Potassium: 3.9 mmol/L (ref 3.5–5.1)
SODIUM: 140 mmol/L (ref 136–145)
TOTAL PROTEIN: 7.5 g/dL (ref 6.4–8.3)

## 2017-07-25 LAB — CBC WITH DIFFERENTIAL (CANCER CENTER ONLY)
Basophils Absolute: 0 10*3/uL (ref 0.0–0.1)
Basophils Relative: 0 %
Eosinophils Absolute: 0.3 10*3/uL (ref 0.0–0.5)
Eosinophils Relative: 5 %
HEMATOCRIT: 31.4 % — AB (ref 34.8–46.6)
HEMOGLOBIN: 10 g/dL — AB (ref 11.6–15.9)
LYMPHS PCT: 19 %
Lymphs Abs: 1.2 10*3/uL (ref 0.9–3.3)
MCH: 28.9 pg (ref 25.1–34.0)
MCHC: 31.8 g/dL (ref 31.5–36.0)
MCV: 90.8 fL (ref 79.5–101.0)
MONOS PCT: 4 %
Monocytes Absolute: 0.2 10*3/uL (ref 0.1–0.9)
NEUTROS ABS: 4.4 10*3/uL (ref 1.5–6.5)
NEUTROS PCT: 72 %
Platelet Count: 247 10*3/uL (ref 145–400)
RBC: 3.46 MIL/uL — AB (ref 3.70–5.45)
RDW: 12.8 % (ref 11.2–14.5)
WBC: 6.1 10*3/uL (ref 3.9–10.3)

## 2017-07-25 LAB — RETICULOCYTES
RBC.: 3.46 MIL/uL — AB (ref 3.70–5.45)
Retic Count, Absolute: 55.4 10*3/uL (ref 33.7–90.7)
Retic Ct Pct: 1.6 % (ref 0.7–2.1)

## 2017-07-25 MED ORDER — NIVOLUMAB CHEMO INJECTION 100 MG/10ML
240.0000 mg | Freq: Once | INTRAVENOUS | Status: AC
  Administered 2017-07-25: 240 mg via INTRAVENOUS
  Filled 2017-07-25: qty 24

## 2017-07-25 MED ORDER — ALTEPLASE 2 MG IJ SOLR
2.0000 mg | Freq: Once | INTRAMUSCULAR | Status: AC
  Administered 2017-07-25: 2 mg
  Filled 2017-07-25: qty 2

## 2017-07-25 MED ORDER — HEPARIN SOD (PORK) LOCK FLUSH 100 UNIT/ML IV SOLN
500.0000 [IU] | Freq: Once | INTRAVENOUS | Status: AC | PRN
Start: 2017-07-25 — End: 2017-07-25
  Administered 2017-07-25: 500 [IU]
  Filled 2017-07-25: qty 5

## 2017-07-25 MED ORDER — SODIUM CHLORIDE 0.9 % IV SOLN
Freq: Once | INTRAVENOUS | Status: AC
  Administered 2017-07-25: 11:00:00 via INTRAVENOUS

## 2017-07-25 MED ORDER — SODIUM CHLORIDE 0.9% FLUSH
10.0000 mL | INTRAVENOUS | Status: DC | PRN
  Administered 2017-07-25: 10 mL
  Filled 2017-07-25: qty 10

## 2017-07-25 MED ORDER — SODIUM CHLORIDE 0.9 % IJ SOLN
10.0000 mL | Freq: Once | INTRAMUSCULAR | Status: AC
  Administered 2017-07-25: 10 mL via INTRAVENOUS
  Filled 2017-07-25: qty 10

## 2017-07-25 NOTE — Patient Instructions (Signed)
[facility] Discharge Instructions for Patients Receiving Chemotherapy  Today you received the following chemotherapy agents Opdivo  To help prevent nausea and vomiting after your treatment, we encourage you to take your nausea medication as directed   If you develop nausea and vomiting that is not controlled by your nausea medication, call the clinic.   BELOW ARE SYMPTOMS THAT SHOULD BE REPORTED IMMEDIATELY:  *FEVER GREATER THAN 100.5 F  *CHILLS WITH OR WITHOUT FEVER  NAUSEA AND VOMITING THAT IS NOT CONTROLLED WITH YOUR NAUSEA MEDICATION  *UNUSUAL SHORTNESS OF BREATH  *UNUSUAL BRUISING OR BLEEDING  TENDERNESS IN MOUTH AND THROAT WITH OR WITHOUT PRESENCE OF ULCERS  *URINARY PROBLEMS  *BOWEL PROBLEMS  UNUSUAL RASH Items with * indicate a potential emergency and should be followed up as soon as possible.  Feel free to call the clinic should you have any questions or concerns. The clinic phone number is [phone].  Please show the CHEMO ALERT CARD at check-in to the Emergency Department and triage nurse.   

## 2017-07-26 ENCOUNTER — Encounter (HOSPITAL_COMMUNITY): Payer: Self-pay

## 2017-07-26 ENCOUNTER — Other Ambulatory Visit: Payer: Self-pay

## 2017-07-26 ENCOUNTER — Inpatient Hospital Stay (HOSPITAL_COMMUNITY)
Admission: EM | Admit: 2017-07-26 | Discharge: 2017-07-29 | DRG: 206 | Disposition: A | Discharge status: Home / Self Care | Payer: Medicaid Other | Attending: Internal Medicine | Admitting: Internal Medicine

## 2017-07-26 DIAGNOSIS — C792 Secondary malignant neoplasm of skin: Secondary | ICD-10-CM | POA: Diagnosis present

## 2017-07-26 DIAGNOSIS — J45909 Unspecified asthma, uncomplicated: Secondary | ICD-10-CM | POA: Diagnosis present

## 2017-07-26 DIAGNOSIS — E785 Hyperlipidemia, unspecified: Secondary | ICD-10-CM | POA: Diagnosis present

## 2017-07-26 DIAGNOSIS — Z6837 Body mass index (BMI) 37.0-37.9, adult: Secondary | ICD-10-CM

## 2017-07-26 DIAGNOSIS — Y842 Radiological procedure and radiotherapy as the cause of abnormal reaction of the patient, or of later complication, without mention of misadventure at the time of the procedure: Secondary | ICD-10-CM | POA: Diagnosis present

## 2017-07-26 DIAGNOSIS — I428 Other cardiomyopathies: Secondary | ICD-10-CM | POA: Diagnosis present

## 2017-07-26 DIAGNOSIS — I110 Hypertensive heart disease with heart failure: Secondary | ICD-10-CM | POA: Diagnosis present

## 2017-07-26 DIAGNOSIS — R079 Chest pain, unspecified: Secondary | ICD-10-CM

## 2017-07-26 DIAGNOSIS — K5900 Constipation, unspecified: Secondary | ICD-10-CM | POA: Diagnosis present

## 2017-07-26 DIAGNOSIS — E669 Obesity, unspecified: Secondary | ICD-10-CM | POA: Diagnosis present

## 2017-07-26 DIAGNOSIS — Z794 Long term (current) use of insulin: Secondary | ICD-10-CM

## 2017-07-26 DIAGNOSIS — Z7982 Long term (current) use of aspirin: Secondary | ICD-10-CM

## 2017-07-26 DIAGNOSIS — I5021 Acute systolic (congestive) heart failure: Secondary | ICD-10-CM | POA: Diagnosis present

## 2017-07-26 DIAGNOSIS — E0800 Diabetes mellitus due to underlying condition with hyperosmolarity without nonketotic hyperglycemic-hyperosmolar coma (NKHHC): Secondary | ICD-10-CM

## 2017-07-26 DIAGNOSIS — K8020 Calculus of gallbladder without cholecystitis without obstruction: Secondary | ICD-10-CM | POA: Diagnosis present

## 2017-07-26 DIAGNOSIS — G893 Neoplasm related pain (acute) (chronic): Secondary | ICD-10-CM | POA: Diagnosis present

## 2017-07-26 DIAGNOSIS — Z923 Personal history of irradiation: Secondary | ICD-10-CM

## 2017-07-26 DIAGNOSIS — Z79890 Hormone replacement therapy (postmenopausal): Secondary | ICD-10-CM

## 2017-07-26 DIAGNOSIS — M940 Chondrocostal junction syndrome [Tietze]: Principal | ICD-10-CM | POA: Diagnosis present

## 2017-07-26 DIAGNOSIS — R0602 Shortness of breath: Secondary | ICD-10-CM

## 2017-07-26 DIAGNOSIS — I5022 Chronic systolic (congestive) heart failure: Secondary | ICD-10-CM | POA: Diagnosis present

## 2017-07-26 DIAGNOSIS — K219 Gastro-esophageal reflux disease without esophagitis: Secondary | ICD-10-CM | POA: Diagnosis present

## 2017-07-26 DIAGNOSIS — M1990 Unspecified osteoarthritis, unspecified site: Secondary | ICD-10-CM | POA: Diagnosis present

## 2017-07-26 DIAGNOSIS — E7800 Pure hypercholesterolemia, unspecified: Secondary | ICD-10-CM | POA: Diagnosis present

## 2017-07-26 DIAGNOSIS — C3490 Malignant neoplasm of unspecified part of unspecified bronchus or lung: Secondary | ICD-10-CM | POA: Diagnosis present

## 2017-07-26 DIAGNOSIS — E119 Type 2 diabetes mellitus without complications: Secondary | ICD-10-CM

## 2017-07-26 DIAGNOSIS — Z9221 Personal history of antineoplastic chemotherapy: Secondary | ICD-10-CM

## 2017-07-26 DIAGNOSIS — Z8249 Family history of ischemic heart disease and other diseases of the circulatory system: Secondary | ICD-10-CM

## 2017-07-26 DIAGNOSIS — E1140 Type 2 diabetes mellitus with diabetic neuropathy, unspecified: Secondary | ICD-10-CM | POA: Diagnosis present

## 2017-07-26 DIAGNOSIS — E039 Hypothyroidism, unspecified: Secondary | ICD-10-CM | POA: Diagnosis present

## 2017-07-26 DIAGNOSIS — R791 Abnormal coagulation profile: Secondary | ICD-10-CM | POA: Diagnosis present

## 2017-07-26 HISTORY — DX: Tachycardia, unspecified: R00.0

## 2017-07-26 HISTORY — DX: Other cardiomyopathies: I42.8

## 2017-07-26 HISTORY — DX: Malignant neoplasm of unspecified part of unspecified bronchus or lung: C34.90

## 2017-07-26 NOTE — ED Triage Notes (Signed)
Pt is receiving chemo for lung cancer, last treatment was Monday and since then she's been very short of breath and unable to do any tasks, pt also has a fever currently  Pt complains of right sided soreness and a sore place at the top of her buttocks

## 2017-07-27 ENCOUNTER — Other Ambulatory Visit (HOSPITAL_COMMUNITY): Payer: Medicaid Other

## 2017-07-27 ENCOUNTER — Other Ambulatory Visit: Payer: Self-pay

## 2017-07-27 ENCOUNTER — Emergency Department (HOSPITAL_COMMUNITY): Payer: Medicaid Other

## 2017-07-27 ENCOUNTER — Encounter (HOSPITAL_COMMUNITY): Payer: Self-pay | Admitting: Radiology

## 2017-07-27 ENCOUNTER — Observation Stay (HOSPITAL_COMMUNITY): Payer: Medicaid Other

## 2017-07-27 DIAGNOSIS — R000 Tachycardia, unspecified: Secondary | ICD-10-CM

## 2017-07-27 DIAGNOSIS — I10 Essential (primary) hypertension: Secondary | ICD-10-CM

## 2017-07-27 DIAGNOSIS — I420 Dilated cardiomyopathy: Secondary | ICD-10-CM | POA: Insufficient documentation

## 2017-07-27 DIAGNOSIS — R079 Chest pain, unspecified: Secondary | ICD-10-CM

## 2017-07-27 LAB — I-STAT CHEM 8, ED
BUN: 5 mg/dL — ABNORMAL LOW (ref 6–20)
Calcium, Ion: 1.12 mmol/L — ABNORMAL LOW (ref 1.15–1.40)
Chloride: 105 mmol/L (ref 101–111)
Creatinine, Ser: 0.6 mg/dL (ref 0.44–1.00)
Glucose, Bld: 247 mg/dL — ABNORMAL HIGH (ref 65–99)
HCT: 26 % — ABNORMAL LOW (ref 36.0–46.0)
HEMOGLOBIN: 8.8 g/dL — AB (ref 12.0–15.0)
POTASSIUM: 3.5 mmol/L (ref 3.5–5.1)
Sodium: 141 mmol/L (ref 135–145)
TCO2: 26 mmol/L (ref 22–32)

## 2017-07-27 LAB — BRAIN NATRIURETIC PEPTIDE: B Natriuretic Peptide: 43.2 pg/mL (ref 0.0–100.0)

## 2017-07-27 LAB — PROTIME-INR
INR: 0.97
PROTHROMBIN TIME: 12.8 s (ref 11.4–15.2)

## 2017-07-27 LAB — I-STAT TROPONIN, ED: Troponin i, poc: 0.04 ng/mL (ref 0.00–0.08)

## 2017-07-27 LAB — CBC WITH DIFFERENTIAL/PLATELET
BASOS PCT: 0 %
Basophils Absolute: 0 10*3/uL (ref 0.0–0.1)
EOS ABS: 0.4 10*3/uL (ref 0.0–0.7)
Eosinophils Relative: 6 %
HEMATOCRIT: 29.2 % — AB (ref 36.0–46.0)
HEMOGLOBIN: 9.4 g/dL — AB (ref 12.0–15.0)
LYMPHS ABS: 1.3 10*3/uL (ref 0.7–4.0)
Lymphocytes Relative: 18 %
MCH: 28.8 pg (ref 26.0–34.0)
MCHC: 32.2 g/dL (ref 30.0–36.0)
MCV: 89.6 fL (ref 78.0–100.0)
MONOS PCT: 5 %
Monocytes Absolute: 0.3 10*3/uL (ref 0.1–1.0)
NEUTROS ABS: 5.2 10*3/uL (ref 1.7–7.7)
NEUTROS PCT: 71 %
Platelets: 288 10*3/uL (ref 150–400)
RBC: 3.26 MIL/uL — ABNORMAL LOW (ref 3.87–5.11)
RDW: 12.8 % (ref 11.5–15.5)
WBC: 7.2 10*3/uL (ref 4.0–10.5)

## 2017-07-27 LAB — D-DIMER, QUANTITATIVE: D-Dimer, Quant: 2.08 ug/mL-FEU — ABNORMAL HIGH (ref 0.00–0.50)

## 2017-07-27 LAB — CBG MONITORING, ED
GLUCOSE-CAPILLARY: 243 mg/dL — AB (ref 65–99)
Glucose-Capillary: 296 mg/dL — ABNORMAL HIGH (ref 65–99)

## 2017-07-27 LAB — I-STAT BETA HCG BLOOD, ED (MC, WL, AP ONLY)

## 2017-07-27 LAB — TROPONIN I
TROPONIN I: 0.06 ng/mL — AB (ref <0.03)
TROPONIN I: 0.04 ng/mL — AB (ref <0.03)

## 2017-07-27 LAB — GLUCOSE, CAPILLARY: GLUCOSE-CAPILLARY: 250 mg/dL — AB (ref 65–99)

## 2017-07-27 LAB — APTT: aPTT: 30 seconds (ref 24–36)

## 2017-07-27 MED ORDER — ALTEPLASE 2 MG IJ SOLR
2.0000 mg | Freq: Once | INTRAMUSCULAR | Status: AC
  Administered 2017-07-27: 2 mg
  Filled 2017-07-27: qty 2

## 2017-07-27 MED ORDER — IOPAMIDOL (ISOVUE-370) INJECTION 76%
100.0000 mL | Freq: Once | INTRAVENOUS | Status: AC | PRN
  Administered 2017-07-27: 61 mL via INTRAVENOUS

## 2017-07-27 MED ORDER — INSULIN ASPART 100 UNIT/ML ~~LOC~~ SOLN
0.0000 [IU] | Freq: Every day | SUBCUTANEOUS | Status: DC
  Administered 2017-07-27: 2 [IU] via SUBCUTANEOUS

## 2017-07-27 MED ORDER — PANTOPRAZOLE SODIUM 40 MG PO TBEC
40.0000 mg | DELAYED_RELEASE_TABLET | Freq: Every day | ORAL | Status: DC
Start: 2017-07-27 — End: 2017-07-29
  Administered 2017-07-27 – 2017-07-29 (×3): 40 mg via ORAL
  Filled 2017-07-27 (×3): qty 1

## 2017-07-27 MED ORDER — ASPIRIN EC 81 MG PO TBEC
81.0000 mg | DELAYED_RELEASE_TABLET | Freq: Every day | ORAL | Status: DC
  Administered 2017-07-27 – 2017-07-29 (×3): 81 mg via ORAL
  Filled 2017-07-27 (×3): qty 1

## 2017-07-27 MED ORDER — ONDANSETRON HCL 4 MG/2ML IJ SOLN
4.0000 mg | Freq: Four times a day (QID) | INTRAMUSCULAR | Status: DC | PRN
  Administered 2017-07-28: 4 mg via INTRAVENOUS
  Filled 2017-07-27: qty 2

## 2017-07-27 MED ORDER — HEPARIN SODIUM (PORCINE) 5000 UNIT/ML IJ SOLN
5000.0000 [IU] | Freq: Three times a day (TID) | INTRAMUSCULAR | Status: DC
  Administered 2017-07-27 – 2017-07-29 (×6): 5000 [IU] via SUBCUTANEOUS
  Filled 2017-07-27 (×6): qty 1

## 2017-07-27 MED ORDER — INSULIN ASPART 100 UNIT/ML ~~LOC~~ SOLN
0.0000 [IU] | Freq: Three times a day (TID) | SUBCUTANEOUS | Status: DC
  Administered 2017-07-27: 5 [IU] via SUBCUTANEOUS
  Administered 2017-07-27: 8 [IU] via SUBCUTANEOUS
  Administered 2017-07-28: 2 [IU] via SUBCUTANEOUS
  Administered 2017-07-28: 8 [IU] via SUBCUTANEOUS
  Administered 2017-07-28: 3 [IU] via SUBCUTANEOUS
  Administered 2017-07-29: 2 [IU] via SUBCUTANEOUS
  Administered 2017-07-29: 3 [IU] via SUBCUTANEOUS
  Filled 2017-07-27: qty 1

## 2017-07-27 MED ORDER — ACETAMINOPHEN 325 MG PO TABS: 650.0000 mg | ORAL_TABLET | ORAL | Status: DC | PRN

## 2017-07-27 MED ORDER — FENTANYL CITRATE (PF) 100 MCG/2ML IJ SOLN
50.0000 ug | Freq: Once | INTRAMUSCULAR | Status: AC
  Administered 2017-07-27: 50 ug via INTRAVENOUS
  Filled 2017-07-27: qty 2

## 2017-07-27 MED ORDER — LEVALBUTEROL HCL 0.63 MG/3ML IN NEBU: 0.6300 mg | INHALATION_SOLUTION | Freq: Four times a day (QID) | RESPIRATORY_TRACT | Status: DC | PRN

## 2017-07-27 MED ORDER — TRAZODONE HCL 50 MG PO TABS: 50.0000 mg | ORAL_TABLET | Freq: Every evening | ORAL | Status: DC | PRN

## 2017-07-27 MED ORDER — METOPROLOL TARTRATE 25 MG PO TABS
25.0000 mg | ORAL_TABLET | Freq: Two times a day (BID) | ORAL | Status: DC
  Administered 2017-07-27: 25 mg via ORAL
  Filled 2017-07-27: qty 1

## 2017-07-27 MED ORDER — LEVOTHYROXINE SODIUM 75 MCG PO TABS
150.0000 ug | ORAL_TABLET | Freq: Every day | ORAL | Status: DC
Start: 2017-07-28 — End: 2017-07-29
  Administered 2017-07-28 – 2017-07-29 (×2): 150 ug via ORAL
  Filled 2017-07-27 (×2): qty 2

## 2017-07-27 MED ORDER — OXYCODONE-ACETAMINOPHEN 5-325 MG PO TABS
1.0000 | ORAL_TABLET | Freq: Four times a day (QID) | ORAL | Status: DC | PRN
  Administered 2017-07-28: 1 via ORAL
  Filled 2017-07-27: qty 1

## 2017-07-27 MED ORDER — METOPROLOL TARTRATE 25 MG PO TABS
25.0000 mg | ORAL_TABLET | Freq: Two times a day (BID) | ORAL | Status: DC
  Administered 2017-07-28 – 2017-07-29 (×3): 25 mg via ORAL
  Filled 2017-07-27 (×5): qty 1

## 2017-07-27 MED ORDER — LIDOCAINE-PRILOCAINE 2.5-2.5 % EX CREA
TOPICAL_CREAM | Freq: Once | CUTANEOUS | Status: AC
  Administered 2017-07-27: via TOPICAL
  Filled 2017-07-27: qty 5

## 2017-07-27 MED ORDER — LOSARTAN POTASSIUM 25 MG PO TABS
25.0000 mg | ORAL_TABLET | Freq: Every day | ORAL | Status: DC
  Administered 2017-07-27 – 2017-07-29 (×3): 25 mg via ORAL
  Filled 2017-07-27 (×3): qty 1

## 2017-07-27 MED ORDER — DULOXETINE HCL 60 MG PO CPEP
60.0000 mg | ORAL_CAPSULE | Freq: Every day | ORAL | Status: DC
  Administered 2017-07-27 – 2017-07-29 (×3): 60 mg via ORAL
  Filled 2017-07-27 (×3): qty 1

## 2017-07-27 MED ORDER — METHOCARBAMOL 500 MG PO TABS
500.0000 mg | ORAL_TABLET | Freq: Three times a day (TID) | ORAL | Status: DC | PRN
Start: 2017-07-27 — End: 2017-07-29

## 2017-07-27 MED ORDER — SENNA 8.6 MG PO TABS: 1.0000 | ORAL_TABLET | Freq: Every day | ORAL | Status: DC | PRN

## 2017-07-27 MED ORDER — CARVEDILOL 6.25 MG PO TABS
6.2500 mg | ORAL_TABLET | Freq: Two times a day (BID) | ORAL | Status: DC
  Administered 2017-07-27: 6.25 mg via ORAL
  Filled 2017-07-27: qty 1

## 2017-07-27 MED ORDER — IOPAMIDOL (ISOVUE-370) INJECTION 76%
INTRAVENOUS | Status: AC
  Administered 2017-07-27: 61 mL via INTRAVENOUS
  Filled 2017-07-27: qty 100

## 2017-07-27 MED ORDER — MORPHINE SULFATE (PF) 4 MG/ML IV SOLN
8.0000 mg | Freq: Once | INTRAVENOUS | Status: AC
  Administered 2017-07-27: 8 mg via INTRAVENOUS
  Filled 2017-07-27: qty 2

## 2017-07-27 MED ORDER — SODIUM CHLORIDE 0.9 % IV SOLN
INTRAVENOUS | Status: DC
  Administered 2017-07-27 – 2017-07-28 (×4): via INTRAVENOUS

## 2017-07-27 NOTE — Consult Note (Addendum)
Cardiology Consultation:   Patient ID: Marilyn [name]; [id number]; January 20, 1966   Admit date: 07/26/2017 Date of Consult: 07/27/2017  Primary Care Provider: [name], FNP Primary Cardiologist: Dr. Debara [facility]  Chief Complaint: chest pain  Patient Profile:   Marilyn [name] is a 52 y.o. female with a hx of metastatic lung cancer, history of possible asthma, presumed NICM (EF 45-50% by echo 10/2016, 52% by low risk nuc 11/2016, 55-60% 07/2017) without prior syndrome of CHF, sinus tachycardia, DM, hypercholesterolemia, HTN, neuropathy, obesity, anemia who is being seen today for the evaluation of chest pain at the request of Dr. Wendee Beavers.  History of Present Illness:   She was diagnosed with lung adenocarcinoma in 12/2015 and has undergone radiation and chemotherapy. She was initially treated with carboplatin/Taxol and maintenance durvalumab which was switched to niovlumab once metastatic disease was noted. She has also undergone palliative RT to right sided subcutaneous metastatic mass. 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 Last onc visit 07/11/17 with f/u of CT scan showing "development of right axillary, right external iliac, and right inguinal adenopathy suspicious for nodal metastasis. Also decreased size and enhancement of right lateral body wall lesion. Lung stable. " Plan was to continue Nivolumab, consider palliative RT to symptomatic lesions, and repeat PET in 3 weeks to consider radiation versus a different chemo/immunotherapy.   I can see she has a history of minimally elevated troponin back in 2017 in the setting of her new diagnosis of lung CA. In 2018 she was first evaluated by cardiologist evaluated by Dr. Debara Pickett for sinus tachycardia - he felt this was possibly related to CHF versus recent iatronic thyroid replacement for significant hypothyroidism - it is worth noting persistent tachycardia appears to be present  in Epic at least back to 2013. LVEF 10/2016 was 45-50% by echo. F/u nuc 11/2016 report states "probably normal, low risk stress nuclear study with soft tissue attenuation; no ischemia; EF 52 but visually appears better; normal wall motion." F/u echocardiogram just recently 07/15/17 showed EF 55-60%, mild AI otherwise normal.   She reports chronic intermittent chest pain and dyspnea dating back at least the last few years since cancer dx. Yesterday she developed acute right sided sharp chest pain which she states was stabbing and lasted somewhere between 30-60 seconds. It was not worse with inspiration, palpation or movement. No CP on exertion but does describe worsening DOE lately. No edema, orthopnea, syncope, bleeding.Labs reveal initial troponin 0.06 (f/u POC troponin negative 5 hours later), BNP wnl, d-dimer 2.08. CXR without significant change from prior. Labs notable for initial temp 100.2, pulse 111-133, normal pulse ox, BP normal to minimally elevated. She is being treated with IVF and undergoing CTA showed no acute changes but redemonstrated concerns for metastatic disease seen on CTA 06/2017. No pericardial effusion, aortic dissection, aortic aneurysm, pleural effusion or pulmonary edema. She remains chest pain free but still notes DOE while talking or exerting herself.  Past Medical History:  Diagnosis Date  . Arthritis   . Asthma   . Diabetes mellitus    Type II  . Hemorrhoids   . Hypercholesteremia   . Hypertension   . Hypothyroidism   . Metastatic lung cancer (metastasis from lung to other site) (HCC)    Lung, Mets to skin- right flank. CT angio 2019 suspicious for axillary, external iliac, right inguinal mets  . 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 NICM (  nonischemic cardiomyopathy) ([place])    a. mild - EF 45-50% by echo 10/2016 but EF 52% by low risk nuc 11/2016. b. Echo 07/2017 - EF normal, mild AI, unremarkable echo otherwise.  . Obese   . Pneumonia 2017  . Sinus  tachycardia    a. dates back to at least 2013, etiology unclear.    Past Surgical History:  Procedure Laterality Date  . CESAREAN SECTION    . COLONOSCOPY    . I/D  arm Right 2013  . IR FLUORO GUIDE PORT INSERTION RIGHT  02/18/2017  . IR GENERIC HISTORICAL  01/08/2016   IR US GUIDE VASC ACCESS RIGHT 01/08/2016 [name], DO [facility]  . IR GENERIC HISTORICAL  01/08/2016   IR FLUORO GUIDE CV LINE RIGHT 01/08/2016 [name], DO [facility]  . IR US GUIDE VASC ACCESS RIGHT  02/18/2017  . MULTIPLE EXTRACTIONS WITH ALVEOLOPLASTY N/A 03/25/2017   Procedure: MULTIPLE EXTRACTION WITH ALVEOLOPLASTY (teeth #s four, six, seven, eight, nine, ten, eleven, one, twenty-three, twenty-four, twenty-five, twenty-six, twenty-nine, thirty);  Surgeon: [name], [name];  Location: [facility];  Service: Oral Surgery;  Laterality: N/A;  . TUBAL LIGATION    . US guided core needle biopsy     of right lower neck/supraclavicular lymph nodes     Inpatient Medications: Scheduled Meds:  Continuous Infusions: . sodium chloride 125 mL/hr at 07/27/17 0128   PRN Meds:   Home Meds: Prior to Admission medications   Medication Sig Start Date End Date Taking? Authorizing Provider  aspirin EC 81 MG tablet Take 1 tablet (81 mg total) by mouth daily. 07/26/16  Yes [name], MD  carvedilol (COREG) 6.25 MG tablet Take 1 tablet (6.25 mg total) by mouth 2 (two) times daily. Patient taking differently: Take 6.25 mg by mouth daily.  12/01/16  Yes [name], [name], MD  clotrimazole (CLOTRIMAZOLE ANTI-FUNGAL) 1 % cream Apply 1 application topically 2 (two) times daily. 07/17/17  Yes [name], MD  Clotrimazole POWD Apply 5 g topically 2 (two) times daily. To rash in groin. Keep area as dry as possible. 07/11/17  Yes [name], MD  DULoxetine (CYMBALTA) 60 MG capsule Take 1 capsule (60 mg total) by mouth daily. 09/20/16  Yes [name], MD  insulin aspart (NOVOLOG) 100 UNIT/ML FlexPen  As per sliding scale instructions Patient taking differently: Inject 0-10 Units into the skin 3 (three) times daily. As per sliding scale instructions 01/16/16  Yes [name], MD  Insulin Degludec (TRESIBA FLEXTOUCH) 200 UNIT/ML SOPN Inject 100 Units into the skin daily before breakfast.    Yes [provider]  ketoconazole (NIZORAL) 2 % cream Apply to rash once daily. Keep area dry between applications. 07/20/17  Yes Tanner, Lyndon Code., PA-C  levothyroxine (SYNTHROID, LEVOTHROID) 150 MCG tablet Take 1 tablet (150 mcg total) by mouth daily before breakfast. 06/15/17  Yes [name], MD  lidocaine-prilocaine (EMLA) cream Apply 1 application topically as needed. Patient taking differently: Apply 1 application topically as needed (access port).  04/20/17  Yes [name], MD  loratadine (CLARITIN) 10 MG tablet Take 10 mg by mouth daily.   Yes [provider]  losartan (COZAAR) 25 MG tablet Take 25 mg by mouth daily.   Yes [provider]  methocarbamol (ROBAXIN) 500 MG tablet Take 1 tablet (500 mg total) by mouth every 8 (eight) hours as needed for muscle spasms. 06/03/17  Yes [name], MD  naproxen (NAPROSYN) 500 MG tablet Take  1 tablet (500 mg total) by mouth 2 (two) times daily with a meal. 06/21/17  Yes Nanavati, Ankit, MD  oxyCODONE-acetaminophen (PERCOCET) 5-325 MG tablet Take 1 tablet by mouth every 6 (six) hours as needed. Patient taking differently: Take 1 tablet by mouth every 6 (six) hours as needed for moderate pain or severe pain.  03/25/17  Yes [name], [name]  polyethylene glycol [facility]) packet Take 17 g by mouth daily. 07/11/17  Yes [name], MD  senna-docusate (SENNA S) 8.6-50 MG tablet Take 2 tablets by mouth 2 (two) times daily. May reduce to 2 tab po HS as needed once regular BM estabished Patient taking differently: Take 2 tablets by mouth 2 (two) times daily as needed (FOR CONSTIPATION). = 05/24/16  Yes [name], MD  traZODone (DESYREL) 50 MG tablet Take 1-2 tablets (50-100 mg total) by mouth at bedtime as needed for sleep. 05/16/17  Yes [name], MD  losartan (COZAAR) 25 MG tablet Take 1 tablet (25 mg total) by mouth daily. Patient not taking: Reported on 07/27/2017 02/04/17 06/03/17  Pixie Casino, MD  miconazole (MICOTIN) 2 % powder Apply topically 2 (two) times daily. Apply to area of rash over the perineum Patient not taking: Reported on 06/21/2017 03/21/17   [name], MD  omeprazole (PRILOSEC) 20 MG capsule Take 1 capsule (20 mg total) by mouth daily. Patient not taking: Reported on 07/27/2017 04/20/17   [name], MD  prochlorperazine (COMPAZINE) 10 MG tablet Take 1 tablet (10 mg total) by mouth every 6 (six) hours as needed (Nausea or vomiting). 01/01/16 03/03/16  [name], MD    Allergies:   No Known Allergies  Social History:   Social History   Socioeconomic History  . Marital status: Divorced    Spouse name: Not on file  . Number of children: Not on file  . Years of education: Not on file  . Highest education level: Not on file  Occupational History  . Not on file  Social Needs  . Financial resource strain: Not on file  . Food insecurity:    Worry: Not on file    Inability: Not on file  . Transportation needs:    Medical: Not on file    Non-medical: Not on file  Tobacco Use  . Smoking status: Never Smoker  . Smokeless tobacco: Never Used  Substance and Sexual Activity  . Alcohol use: No  . Drug use: No  . Sexual activity: Not Currently  Lifestyle  . Physical activity:    Days per week: Not on file    Minutes per session: Not on file  . Stress: Not on file  Relationships  . Social connections:    Talks on phone: Not on file    Gets together: Not on file    Attends religious service: Not on file    Active member of club or organization: Not on file    Attends meetings of clubs or organizations: Not on file     Relationship status: Not on file  . Intimate partner violence:    Fear of current or ex partner: Not on file    Emotionally abused: Not on file    Physically abused: Not on file    Forced sexual activity: Not on file  Other Topics Concern  . Not on file  Social History Narrative   No bird or mold exposure. No recent travel.    Family History:   The patient's family  history includes Heart failure in her mother; Hypertension in her mother. There is no history of Cancer or Rheumatologic disease.  ROS:  Please see the history of present illness.  All other ROS reviewed and negative.     Physical Exam/Data:   Vitals:   07/27/17 0500 07/27/17 0530 07/27/17 0600 07/27/17 0630  BP: (!) 143/74 (!) 142/76 140/76 139/80  Pulse: (!) 113 (!) 112 (!) 115 (!) 111  Resp: (!) 22 (!) 23 (!) 21 17  Temp:      TempSrc:      SpO2: 99% 98% 99% 96%  Weight:      Height:       No intake or output data in the 24 hours ending 07/27/17 0825 Filed Weights   07/26/17 2326  Weight: 266 lb 4.8 oz (120.8 kg)   Body mass index is 37.14 kg/m.  General: Well developed, well nourished obese AAF, in no acute distress. Head: Normocephalic, atraumatic, sclera non-icteric, no xanthomas, nares are without discharge. Neck: Negative for carotid bruits. JVD not elevated. Lungs: Clear bilaterally to auscultation without wheezes, rales, or rhonchi. Breathing is unlabored while eating, but increased effort noted while talking about symptoms. Heart: RRR with S1 S2. No murmurs, rubs, or gallops appreciated. Abdomen: Soft, non-tender, non-distended with normoactive bowel sounds. No hepatomegaly. No rebound/guarding. No obvious abdominal masses. Msk:  Strength and tone appear normal for age. Extremities: No clubbing or cyanosis. No edema.  Distal pedal pulses are 2+ and equal bilaterally. Neuro: Alert and oriented X 3. No facial asymmetry. No focal deficit. Moves all extremities spontaneously. Psych:  Responds to  questions appropriately with a normal affect.  EKG:  The EKG was personally reviewed and demonstrates: Yesterday: Sinus tach 128bpm baseline wander making interpretation difficult Today: Sinus tach 117bpm with mild ST elevation I, II, V4-V6, no reciprocal changes. This was seen in 06/2016 EKG.  Relevant CV Studies: As above.  Laboratory Data:  Chemistry Recent Labs  Lab 07/25/17 0951 07/27/17 0207  NA 140 141  K 3.9 3.5  CL 106 105  CO2 25  --   GLUCOSE 225* 247*  BUN 8 5*  CREATININE 0.82 0.60  CALCIUM 8.8  --   GFRNONAA >60  --   GFRAA >60  --   ANIONGAP 9  --     Recent Labs  Lab 07/25/17 0951  PROT 7.5  ALBUMIN 3.1*  AST 14  ALT 12  ALKPHOS 89  BILITOT <0.2*   Hematology Recent Labs  Lab 07/25/17 0951 07/27/17 0158 07/27/17 0207  WBC 6.1 7.2  --   RBC 3.46*  3.46* 3.26*  --   HGB 10.0* 9.4* 8.8*  HCT 31.4* 29.2* 26.0*  MCV 90.8 89.6  --   MCH 28.9 28.8  --   MCHC 31.8 32.2  --   RDW 12.8 12.8  --   PLT 247 288  --    Cardiac Enzymes Recent Labs  Lab 07/27/17 0158  TROPONINI 0.06*    Recent Labs  Lab 07/27/17 0649  TROPIPOC 0.04    BNP Recent Labs  Lab 07/27/17 0158  BNP 43.2    DDimer  Recent Labs  Lab 07/27/17 0158  DDIMER 2.08*    Radiology/Studies:  Dg Chest 2 View  Result Date: 07/27/2017 CLINICAL DATA:  Lung cancer. Increasing shortness of breath and fever. EXAM: CHEST - 2 VIEW COMPARISON:  Chest 06/21/2017.  CT chest 06/21/2017 FINDINGS: Shallow inspiration. Linear atelectasis in the lung bases. Right suprahilar/paratracheal fullness is unchanged since previous  study. This corresponds to medial consolidation on previous CT, likely due to postradiation changes. No acute consolidation or airspace disease is appreciated. There is blunting of the costophrenic angles which may represent small pleural effusions versus pleural thickening. No pneumothorax. Heart size and pulmonary vascularity are normal. IMPRESSION: Chronic right  hilar/right paratracheal changes similar to previous study. Shallow inspiration with atelectasis in the lung bases. No significant change. Electronically Signed   By: Lucienne Capers M.D.   On: 07/27/2017 01:32    Assessment and Plan:   1. Brief chest pain and acute on chronic DOE - she has h/o elevated troponin as above when dx with CA in 2017. Her first full troponin here minimally elevated with 2nd set (POC) being negative. Chest pain is quite atypical for angina. Nuc <1 year ago was low risk and recent echo earlier this month was unremarkable. There is no pericardial effusion or sign of volume overload. No rub on exam, but EKG does suggest minimal ST elevation (reviewed old tracings and this appears accentuated from prior, but was noted on an EKG in 2018 as well). Will review with MD. Note initial temp 100.2 but f/u temp 97.6.  2. Chronic sinus tachycardia - appears chronic at least back to 2013, even pre-dating diagnosis of mild LV dysfunction or thyroid repletion. May be systemic response to her underlying cancer +/- inappropriate sinus tach. Consider switching carvedilol to metoprolol for more selective activity and control of HR.  3. H/o mild presumed NICM - recent LVEF was normal. Will review with MD - tentatively wrote care order to hold off on repeat echo as it was just done this month.  4. Metastatic lung cancer - per primary team.  5. Essential HTN with variable readings  - follow as inpatient. Can consider titration of beta blocker.  For questions or updates, please contact [facility] Please consult [email or web address] for contact info under Cardiology/STEMI.    Signed, Charlie Pitter, PA-C  07/27/2017 8:25 AM

## 2017-07-27 NOTE — ED Provider Notes (Addendum)
[facility] DEPT Provider Note   CSN: [id number] Arrival date & time: 07/26/17  2254     History   Chief Complaint Chief Complaint  Patient presents with  . Shortness of Breath    HPI Marilyn [name] is a 52 y.o. female.  HPI 52 year old comes in with chief complaint of chest pain.  Patient has history of CHF, diabetes, lung cancer -with metastases to right flank. Patient states that she started having sudden onset chest pain yesterday during the daytime.  Pain initially was in the right flank but then also moved anteriorly.  Pain is described as "sharp and grabbing pain".  Patient denies any worsening of the pain with deep inspiration.  Patient has shortness of breath.    Past Medical History:  Diagnosis Date  . Arthritis   . Asthma   . Cardiomyopathy ([place])   . CHF (congestive heart failure) ([place])   . Diabetes mellitus    Type II  . Hemorrhoids   . Hypercholesteremia   . Hypertension   . Hypothyroidism   . Lung cancer (HCC)    Lung, Mets to skin- right flank  . 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 Obese   . Pneumonia 2017  . Tachycardia     Patient Active Problem List   Diagnosis Date Noted  . Non-ischemic cardiomyopathy ([place]) 02/04/2017  . 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 Acute systolic heart failure ([place]) 12/01/2016  . Primary cancer of right lung metastatic to other site ([place]) 11/24/2016  . SOB (shortness of breath) 10/26/2016  . Hypothyroidism 08/22/2016  . PICC (peripherally inserted central catheter) flush 07/16/2016  . Community acquired pneumonia   . Sinus tachycardia   . Thrush of mouth and esophagus ([place]) 04/07/2016  . Constipation 04/07/2016  . Palliative care by specialist   . Port catheter in place 01/19/2016  . Dehydration 01/14/2016  . Tachycardia 01/14/2016  . Adenocarcinoma of left lung, stage 3 ([place]) 01/01/2016  . Non-small cell lung cancer ([place]) 12/31/2015  . Mediastinal mass   . Chest pain  12/26/2015  . Headache 12/26/2015  . Diabetes mellitus ([place]) 12/26/2015    Past Surgical History:  Procedure Laterality Date  . CESAREAN SECTION    . COLONOSCOPY    . I/D  arm Right 2013  . IR FLUORO GUIDE PORT INSERTION RIGHT  02/18/2017  . IR GENERIC HISTORICAL  01/08/2016   IR US GUIDE VASC ACCESS RIGHT 01/08/2016 [name], DO [facility]-INTERV RAD  . IR GENERIC HISTORICAL  01/08/2016   IR FLUORO GUIDE CV LINE RIGHT 01/08/2016 [name], DO [facility]INTER[facility]  . IR US GUIDE VASC ACCESS RIGHT  02/18/2017  . MULTIPLE EXTRACTIONS WITH ALVEOLOPLASTY N/A 03/25/2017   Procedure: MULTIPLE EXTRACTION WITH ALVEOLOPLASTY (teeth #s four, six, seven, eight, nine, ten, eleven, one, twenty-three, twenty-four, twenty-five, twenty-six, twenty-nine, thirty);  Surgeon: [name], [name];  Location: [facility];  Service: Oral Surgery;  Laterality: N/A;  . TUBAL LIGATION    . US guided core needle biopsy     of right lower neck/supraclavicular lymph nodes     OB History   None      Home Medications    Prior to Admission medications   Medication Sig Start Date End Date Taking? Authorizing Provider  aspirin EC 81 MG tablet Take 1 tablet (81 mg total) by mouth daily. 07/26/16  Yes [name], MD  carvedilol (COREG) 6.25 MG tablet Take 1 tablet (6.25 mg total) by mouth  2 (two) times daily. Patient taking differently: Take 6.25 mg by mouth daily.  12/01/16  Yes [name], [name], MD  clotrimazole (CLOTRIMAZOLE ANTI-FUNGAL) 1 % cream Apply 1 application topically 2 (two) times daily. 07/17/17  Yes [name], MD  Clotrimazole POWD Apply 5 g topically 2 (two) times daily. To rash in groin. Keep area as dry as possible. 07/11/17  Yes [name], MD  DULoxetine (CYMBALTA) 60 MG capsule Take 1 capsule (60 mg total) by mouth daily. 09/20/16  Yes [name], MD  insulin aspart (NOVOLOG) 100 UNIT/ML FlexPen As per sliding scale instructions Patient taking differently: Inject 0-10 Units  into the skin 3 (three) times daily. As per sliding scale instructions 01/16/16  Yes [name], MD  Insulin Degludec (TRESIBA FLEXTOUCH) 200 UNIT/ML SOPN Inject 100 Units into the skin daily before breakfast.    Yes [provider]  ketoconazole (NIZORAL) 2 % cream Apply to rash once daily. Keep area dry between applications. 07/20/17  Yes Tanner, Lyndon Code., PA-C  levothyroxine (SYNTHROID, LEVOTHROID) 150 MCG tablet Take 1 tablet (150 mcg total) by mouth daily before breakfast. 06/15/17  Yes [name], MD  lidocaine-prilocaine (EMLA) cream Apply 1 application topically as needed. Patient taking differently: Apply 1 application topically as needed (access port).  04/20/17  Yes [name], MD  loratadine (CLARITIN) 10 MG tablet Take 10 mg by mouth daily.   Yes [provider]  losartan (COZAAR) 25 MG tablet Take 25 mg by mouth daily.   Yes [provider]  methocarbamol (ROBAXIN) 500 MG tablet Take 1 tablet (500 mg total) by mouth every 8 (eight) hours as needed for muscle spasms. 06/03/17  Yes [name], MD  naproxen (NAPROSYN) 500 MG tablet Take 1 tablet (500 mg total) by mouth 2 (two) times daily with a meal. 06/21/17  Yes Mingo Siegert, MD  oxyCODONE-acetaminophen (PERCOCET) 5-325 MG tablet Take 1 tablet by mouth every 6 (six) hours as needed. Patient taking differently: Take 1 tablet by mouth every 6 (six) hours as needed for moderate pain or severe pain.  03/25/17  Yes [name], [name]  polyethylene glycol [facility]) packet Take 17 g by mouth daily. 07/11/17  Yes [name], MD  senna-docusate (SENNA S) 8.6-50 MG tablet Take 2 tablets by mouth 2 (two) times daily. May reduce to 2 tab po HS as needed once regular BM estabished Patient taking differently: Take 2 tablets by mouth 2 (two) times daily as needed (FOR CONSTIPATION). = 05/24/16  Yes [name], MD  traZODone (DESYREL) 50 MG tablet Take 1-2 tablets (50-100 mg  total) by mouth at bedtime as needed for sleep. 05/16/17  Yes [name], MD  losartan (COZAAR) 25 MG tablet Take 1 tablet (25 mg total) by mouth daily. Patient not taking: Reported on 07/27/2017 02/04/17 06/03/17  Pixie Casino, MD  miconazole (MICOTIN) 2 % powder Apply topically 2 (two) times daily. Apply to area of rash over the perineum Patient not taking: Reported on 06/21/2017 03/21/17   [name], MD  omeprazole (PRILOSEC) 20 MG capsule Take 1 capsule (20 mg total) by mouth daily. Patient not taking: Reported on 07/27/2017 04/20/17   [name], MD  prochlorperazine (COMPAZINE) 10 MG tablet Take 1 tablet (10 mg total) by mouth every 6 (six) hours as needed (Nausea or vomiting). 01/01/16 03/03/16  [name], MD    Family History Family History  Problem Relation Age of Onset  .  Heart failure Mother   . Hypertension Mother   . Cancer Neg Hx   . Rheumatologic disease Neg Hx     Social History Social History   Tobacco Use  . Smoking status: Never Smoker  . Smokeless tobacco: Never Used  Substance Use Topics  . Alcohol use: No  . Drug use: No     Allergies   Patient has no known allergies.   Review of Systems Review of Systems  Constitutional: Positive for activity change.  Respiratory: Positive for chest tightness and shortness of breath.   Cardiovascular: Positive for chest pain.  All other systems reviewed and are negative.    Physical Exam Updated Vital Signs BP (!) 142/76   Pulse (!) 112   Temp 100.2 F (37.9 C) (Oral)   Resp (!) 23   Ht 5\' 11"  (1.803 m)   Wt 120.8 kg (266 lb 4.8 oz)   SpO2 98%   BMI 37.14 kg/m   Physical Exam  Constitutional: She is oriented to person, place, and time. She appears well-developed.  HENT:  Head: Normocephalic and atraumatic.  Eyes: EOM are normal.  Neck: Normal range of motion. Neck supple.  Cardiovascular: Normal rate.  Pulmonary/Chest: Effort normal and breath sounds normal.    Abdominal: Bowel sounds are normal.  Musculoskeletal:       Right lower leg: She exhibits no tenderness and no edema.       Left lower leg: She exhibits no tenderness and no edema.  Neurological: She is alert and oriented to person, place, and time.  Skin: Skin is warm and dry.  Nursing note and vitals reviewed.    ED Treatments / Results  Labs (all labs ordered are listed, but only abnormal results are displayed) Labs Reviewed  CBC WITH DIFFERENTIAL/PLATELET - Abnormal; Notable for the following components:      Result Value   RBC 3.26 (*)    Hemoglobin 9.4 (*)    HCT 29.2 (*)    All other components within normal limits  TROPONIN I - Abnormal; Notable for the following components:   Troponin I 0.06 (*)    All other components within normal limits  D-DIMER, QUANTITATIVE (NOT AT [facility]) - Abnormal; Notable for the following components:   D-Dimer, Quant 2.08 (*)    All other components within normal limits  I-STAT CHEM 8, ED - Abnormal; Notable for the following components:   BUN 5 (*)    Glucose, Bld 247 (*)    Calcium, Ion 1.12 (*)    Hemoglobin 8.8 (*)    HCT 26.0 (*)    All other components within normal limits  BRAIN NATRIURETIC PEPTIDE  APTT  PROTIME-INR  TROPONIN I  TROPONIN I  TROPONIN I  I-STAT BETA HCG BLOOD, ED ([facility], [facility], [facility] ONLY)    EKG EKG Interpretation  Date/Time:  Wednesday July 27 2017 05:57:28 EDT Ventricular Rate:  117 PR Interval:    QRS Duration: 77 QT Interval:  325 QTC Calculation: 454 R Axis:   30 Text Interpretation:  Sinus tachycardia Low voltage, precordial leads No acute changes Nonspecific ST and T wave abnormality No old tracing to compare Confirmed by Varney Biles [phone]) on 07/27/2017 6:01:25 AM   Radiology Dg Chest 2 View  Result Date: 07/27/2017 CLINICAL DATA:  Lung cancer. Increasing shortness of breath and fever. EXAM: CHEST - 2 VIEW COMPARISON:  Chest 06/21/2017.  CT chest 06/21/2017 FINDINGS: Shallow inspiration. Linear  atelectasis in the lung bases. Right suprahilar/paratracheal fullness is unchanged since  previous study. This corresponds to medial consolidation on previous CT, likely due to postradiation changes. No acute consolidation or airspace disease is appreciated. There is blunting of the costophrenic angles which may represent small pleural effusions versus pleural thickening. No pneumothorax. Heart size and pulmonary vascularity are normal. IMPRESSION: Chronic right hilar/right paratracheal changes similar to previous study. Shallow inspiration with atelectasis in the lung bases. No significant change. Electronically Signed   By: Lucienne Capers M.D.   On: 07/27/2017 01:32    Procedures Procedures (including critical care time)  Medications Ordered in ED Medications  0.9 %  sodium chloride infusion ( Intravenous New Bag/Given 07/27/17 0128)  lidocaine-prilocaine (EMLA) cream ( Topical Given 07/27/17 0009)  fentaNYL (SUBLIMAZE) injection 50 mcg (50 mcg Intravenous Given 07/27/17 0128)  morphine 4 MG/ML injection 8 mg (8 mg Intravenous Given 07/27/17 0330)     Initial Impression / Assessment and Plan / ED Course  I have reviewed the triage vital signs and the nursing notes.  Pertinent labs & imaging results that were available during my care of the patient were reviewed by me and considered in my medical decision making (see chart for details).  Clinical Course as of Jul 27 624  Wed Jul 27, 2017  0610 Patient continues to have chest pain.  We will admit her to medicine service.  Patient had a low-grade fever along with tachycardia, therefore I spoke with the hospitalist to see if they want a CT scan right now.  Given my initial gestalt for PE is low, they have ordered a d-dimer which if positive they will get a CT scan.   [AN]    Clinical Course User Index [AN] Varney Biles, MD    Patient comes in with chief complaint of chest discomfort.  Patient has history of lung cancer with metastases to  the right skin.  Patient is having flank tenderness on the right side which is similar to her cancer.  She is also having pain on the left side of her chest.  Patient has CHF history but no CAD.  Initial troponin is negative.  EKG shows tachycardia.  Patient had a CT PE done in March which was normal, and her current pain is not pleuritic in nature.  Patient is also not hypoxic.  She does have a low-grade fever but my suspicion for underlying pneumonia is low.  Chest x-ray ordered and clear.  I think patient is likely having cancer related pain and not a PE.  We will continue to reassess patient and decide on the disposition.  Final Clinical Impressions(s) / ED Diagnoses   Final diagnoses:  Chest pain, unspecified type    ED Discharge Orders    None       Varney Biles, MD 07/27/17 [id number]    Varney Biles, MD 07/27/17 [phone]

## 2017-07-27 NOTE — ED Notes (Signed)
Pt states that she feels better if she's still, if she's moving around her labored breathing comes back

## 2017-07-27 NOTE — ED Notes (Signed)
This RN attempted to draw blood using patient's port. Patient states that she typically has trouble with blood return from her port and that "they usually have to flush it with TPA." [employer] paged. Will consult IV team

## 2017-07-27 NOTE — ED Notes (Signed)
Patient given a [place] sandwich.

## 2017-07-27 NOTE — ED Notes (Signed)
IV team at the bedside attempting to fix port-a-cath access.

## 2017-07-27 NOTE — H&P (Signed)
History and Physical    Marilyn [name]:[id number] DOB: 12-29-1965 DOA: 07/26/2017  PCP: [name], FNP  Patient coming from: home  Chief Complaint: chest pain  HPI: Marilyn [name] is a 52 y.o. female with medical history significant of lung cancer with mets to skin followed by Dr. Irene Limbo as outpatient, diabetes, systolic chf. Presenting with complaints of chest pain. This has been going on since the yesterday days. Nothing she is aware of makes it better or worse. Pain is over left and right breast, descibed as a sudden sharp and grabbing pain. No worsening of pain with deep inspiration.   ED Course: Found to have elevated D dimer, chest x ray was non revealing, bnp within normal limits.   Review of Systems: As per HPI otherwise 10 point review of systems negative.    Past Medical History:  Diagnosis Date  . Arthritis   . Asthma   . Cardiomyopathy ([place])   . CHF (congestive heart failure) ([place])   . Diabetes mellitus    Type II  . Hemorrhoids   . Hypercholesteremia   . Hypertension   . Hypothyroidism   . Lung cancer (HCC)    Lung, Mets to skin- right flank  . 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 Obese   . Pneumonia 2017  . Tachycardia     Past Surgical History:  Procedure Laterality Date  . CESAREAN SECTION    . COLONOSCOPY    . I/D  arm Right 2013  . IR FLUORO GUIDE PORT INSERTION RIGHT  02/18/2017  . IR GENERIC HISTORICAL  01/08/2016   IR US GUIDE VASC ACCESS RIGHT 01/08/2016 [name], DO [facility]  . IR GENERIC HISTORICAL  01/08/2016   IR FLUORO GUIDE CV LINE RIGHT 01/08/2016 [name], DO [facility]  . IR US GUIDE VASC ACCESS RIGHT  02/18/2017  . MULTIPLE EXTRACTIONS WITH ALVEOLOPLASTY N/A 03/25/2017   Procedure: MULTIPLE EXTRACTION WITH ALVEOLOPLASTY (teeth #s four, six, seven, eight, nine, ten, eleven, one, twenty-three, twenty-four, twenty-five, twenty-six, twenty-nine, thirty);  Surgeon: [name], [name];  Location: [place];  Service: Oral  Surgery;  Laterality: N/A;  . TUBAL LIGATION    . US guided core needle biopsy     of right lower neck/supraclavicular lymph nodes     reports that she has never smoked. She has never used smokeless tobacco. She reports that she does not drink alcohol or use drugs.  No Known Allergies  Family History  Problem Relation Age of Onset  . Heart failure Mother   . Hypertension Mother   . Cancer Neg Hx   . Rheumatologic disease Neg Hx     Prior to Admission medications   Medication Sig Start Date End Date Taking? Authorizing Provider  aspirin EC 81 MG tablet Take 1 tablet (81 mg total) by mouth daily. 07/26/16  Yes [name], MD  carvedilol (COREG) 6.25 MG tablet Take 1 tablet (6.25 mg total) by mouth 2 (two) times daily. Patient taking differently: Take 6.25 mg by mouth daily.  12/01/16  Yes [name], [name], MD  clotrimazole (CLOTRIMAZOLE ANTI-FUNGAL) 1 % cream Apply 1 application topically 2 (two) times daily. 07/17/17  Yes [name], MD  Clotrimazole POWD Apply 5 g topically 2 (two) times daily. To rash in groin. Keep area as dry as possible. 07/11/17  Yes [name], MD  DULoxetine (CYMBALTA) 60 MG capsule Take 1 capsule (60 mg total) by mouth daily. 09/20/16  Yes [name], MD  insulin aspart (NOVOLOG) 100 UNIT/ML FlexPen As per sliding scale instructions Patient taking differently: Inject 0-10 Units into the skin 3 (three) times daily. As per sliding scale instructions 01/16/16  Yes [name], MD  Insulin Degludec (TRESIBA FLEXTOUCH) 200 UNIT/ML SOPN Inject 100 Units into the skin daily before breakfast.    Yes [provider]  ketoconazole (NIZORAL) 2 % cream Apply to rash once daily. Keep area dry between applications. 07/20/17  Yes Tanner, Lyndon Code., PA-C  levothyroxine (SYNTHROID, LEVOTHROID) 150 MCG tablet Take 1 tablet (150 mcg total) by mouth daily before breakfast. 06/15/17  Yes [name], MD  lidocaine-prilocaine  (EMLA) cream Apply 1 application topically as needed. Patient taking differently: Apply 1 application topically as needed (access port).  04/20/17  Yes [name], MD  loratadine (CLARITIN) 10 MG tablet Take 10 mg by mouth daily.   Yes [provider]  losartan (COZAAR) 25 MG tablet Take 25 mg by mouth daily.   Yes [provider]  methocarbamol (ROBAXIN) 500 MG tablet Take 1 tablet (500 mg total) by mouth every 8 (eight) hours as needed for muscle spasms. 06/03/17  Yes [name], MD  naproxen (NAPROSYN) 500 MG tablet Take 1 tablet (500 mg total) by mouth 2 (two) times daily with a meal. 06/21/17  Yes Nanavati, Ankit, MD  oxyCODONE-acetaminophen (PERCOCET) 5-325 MG tablet Take 1 tablet by mouth every 6 (six) hours as needed. Patient taking differently: Take 1 tablet by mouth every 6 (six) hours as needed for moderate pain or severe pain.  03/25/17  Yes [name], [name]  polyethylene glycol [facility]) packet Take 17 g by mouth daily. 07/11/17  Yes [name], MD  senna-docusate (SENNA S) 8.6-50 MG tablet Take 2 tablets by mouth 2 (two) times daily. May reduce to 2 tab po HS as needed once regular BM estabished Patient taking differently: Take 2 tablets by mouth 2 (two) times daily as needed (FOR CONSTIPATION). = 05/24/16  Yes [name], MD  traZODone (DESYREL) 50 MG tablet Take 1-2 tablets (50-100 mg total) by mouth at bedtime as needed for sleep. 05/16/17  Yes [name], MD  losartan (COZAAR) 25 MG tablet Take 1 tablet (25 mg total) by mouth daily. Patient not taking: Reported on 07/27/2017 02/04/17 06/03/17  Pixie Casino, MD  miconazole (MICOTIN) 2 % powder Apply topically 2 (two) times daily. Apply to area of rash over the perineum Patient not taking: Reported on 06/21/2017 03/21/17   [name], MD  omeprazole (PRILOSEC) 20 MG capsule Take 1 capsule (20 mg total) by mouth daily. Patient not taking: Reported on 07/27/2017 04/20/17    [name], MD  prochlorperazine (COMPAZINE) 10 MG tablet Take 1 tablet (10 mg total) by mouth every 6 (six) hours as needed (Nausea or vomiting). 01/01/16 03/03/16  [name], MD    Physical Exam: Vitals:   07/27/17 0500 07/27/17 0530 07/27/17 0600 07/27/17 0630  BP: (!) 143/74 (!) 142/76 140/76 139/80  Pulse: (!) 113 (!) 112 (!) 115 (!) 111  Resp: (!) 22 (!) 23 (!) 21 17  Temp:      TempSrc:      SpO2: 99% 98% 99% 96%  Weight:      Height:        Constitutional: NAD, calm, comfortable Vitals:   07/27/17 0500 07/27/17 0530 07/27/17 0600 07/27/17 0630  BP: (!) 143/74 (!) 142/76 140/76 139/80  Pulse: [name])  113 (!) 112 (!) 115 (!) 111  Resp: (!) 22 (!) 23 (!) 21 17  Temp:      TempSrc:      SpO2: 99% 98% 99% 96%  Weight:      Height:       Eyes: PERRL, lids and conjunctivae normal ENMT: Mucous membranes are moist. Posterior pharynx clear of any exudate or lesions. Poor dentition Neck: normal, supple, no masses, no thyromegaly Respiratory: clear to auscultation bilaterally, no wheezing, no crackles.  Cardiovascular: increased rate and normal rhythm, no murmurs / rubs / gallops.  Abdomen: no tenderness, no masses palpated. No hepatosplenomegaly. Bowel sounds positive.  Musculoskeletal: no clubbing / cyanosis. No joint deformity upper and lower extremities.  Skin: no  lesions, ulcers. On limited exam. Neurologic: CN 2-12 grossly intact. Sensation intact Psychiatric: Normal judgment and insight. Alert and oriented x 3. Normal mood.    Labs on Admission: I have personally reviewed following labs and imaging studies  CBC: Recent Labs  Lab 07/25/17 0951 07/27/17 0158 07/27/17 0207  WBC 6.1 7.2  --   NEUTROABS 4.4 5.2  --   HGB 10.0* 9.4* 8.8*  HCT 31.4* 29.2* 26.0*  MCV 90.8 89.6  --   PLT 247 288  --    Basic Metabolic Panel: Recent Labs  Lab 07/25/17 0951 07/27/17 0207  NA 140 141  K 3.9 3.5  CL 106 105  CO2 25  --   GLUCOSE 225* 247*    BUN 8 5*  CREATININE 0.82 0.60  CALCIUM 8.8  --    GFR: Estimated Creatinine Clearance: 121.9 mL/min (by C-G formula based on SCr of 0.6 mg/dL). Liver Function Tests: Recent Labs  Lab 07/25/17 0951  AST 14  ALT 12  ALKPHOS 89  BILITOT <0.2*  PROT 7.5  ALBUMIN 3.1*   No results for input(s): LIPASE, AMYLASE in the last 168 hours. No results for input(s): AMMONIA in the last 168 hours. Coagulation Profile: Recent Labs  Lab 07/27/17 0158  INR 0.97   Cardiac Enzymes: Recent Labs  Lab 07/27/17 0158  TROPONINI 0.06*   BNP (last 3 results) No results for input(s): PROBNP in the last 8760 hours. HbA1C: No results for input(s): HGBA1C in the last 72 hours. CBG: No results for input(s): GLUCAP in the last 168 hours. Lipid Profile: No results for input(s): CHOL, HDL, LDLCALC, TRIG, CHOLHDL, LDLDIRECT in the last 72 hours. Thyroid Function Tests: No results for input(s): TSH, T4TOTAL, FREET4, T3FREE, THYROIDAB in the last 72 hours. Anemia Panel: Recent Labs    07/25/17 0951  RETICCTPCT 1.6   Urine analysis:    Component Value Date/Time   COLORURINE YELLOW 01/01/2017 2347   APPEARANCEUR HAZY (A) 01/01/2017 2347   LABSPEC >1.046 (H) 01/01/2017 2347   PHURINE 5.0 01/01/2017 2347   GLUCOSEU NEGATIVE 01/01/2017 2347   HGBUR NEGATIVE 01/01/2017 2347   BILIRUBINUR NEGATIVE 01/01/2017 2347   KETONESUR NEGATIVE 01/01/2017 2347   PROTEINUR NEGATIVE 01/01/2017 2347   UROBILINOGEN 0.2 02/01/2013 2022   NITRITE POSITIVE (A) 01/01/2017 2347   LEUKOCYTESUR NEGATIVE 01/01/2017 2347    Radiological Exams on Admission: Dg Chest 2 View  Result Date: 07/27/2017 CLINICAL DATA:  Lung cancer. Increasing shortness of breath and fever. EXAM: CHEST - 2 VIEW COMPARISON:  Chest 06/21/2017.  CT chest 06/21/2017 FINDINGS: Shallow inspiration. Linear atelectasis in the lung bases. Right suprahilar/paratracheal fullness is unchanged since previous study. This corresponds to medial  consolidation on previous CT, likely due to postradiation changes. No acute consolidation  or airspace disease is appreciated. There is blunting of the costophrenic angles which may represent small pleural effusions versus pleural thickening. No pneumothorax. Heart size and pulmonary vascularity are normal. IMPRESSION: Chronic right hilar/right paratracheal changes similar to previous study. Shallow inspiration with atelectasis in the lung bases. No significant change. Electronically Signed   By: Lucienne Capers M.D.   On: 07/27/2017 01:32    EKG: Independently reviewed. Sinus tachycardia with no ST elevations or depressions  Assessment/Plan Active Problems:   Chest pain - Troponin x 3 order placed - monitor on telemetry - in context of pt with history of lung cancer with elevated d dimer and tachycardia, will obtain CT angiogram to rule out PE - Cardiology evaluation (msg sent via email per sign out msg)    Diabetes mellitus ([place]) - diabetic diet - SSI    Non-small cell lung cancer ([place]) - followed by Dr. Irene Limbo as outpatient - could be contributing to principle problem will have to r/o other causes first.    Constipation - place on senna prn    Hypothyroidism - continue synthroid, suspected stable  Chronic systolic heart failure ([place]) - continue prior to admission medication regimen - BNP WNL as such    DVT prophylaxis: Heparin Code Status: full Family Communication: d/c patient and family member at bedside. Disposition Plan: pending further work up [employer] called: Cardiology message via email Admission status: obs   Velvet Bathe MD [facility] Pager [phone]  If 7PM-7AM, please contact night-coverage [email or web address] Password [email or web address]  07/27/2017, 8:02 AM

## 2017-07-27 NOTE — Progress Notes (Signed)
52 yo female with dm2, hypertension, hyperlipidemia, CHF, lung cancer c/o chest pain right mid back and left sided chest pain.   D dimer is pending,  Requested cardiology consult by email

## 2017-07-27 NOTE — ED Notes (Signed)
Patient transported to CT 

## 2017-07-28 ENCOUNTER — Observation Stay (HOSPITAL_COMMUNITY): Payer: Medicaid Other

## 2017-07-28 ENCOUNTER — Observation Stay (HOSPITAL_BASED_OUTPATIENT_CLINIC_OR_DEPARTMENT_OTHER): Payer: Medicaid Other

## 2017-07-28 DIAGNOSIS — Y842 Radiological procedure and radiotherapy as the cause of abnormal reaction of the patient, or of later complication, without mention of misadventure at the time of the procedure: Secondary | ICD-10-CM | POA: Diagnosis present

## 2017-07-28 DIAGNOSIS — Z6837 Body mass index (BMI) 37.0-37.9, adult: Secondary | ICD-10-CM | POA: Diagnosis not present

## 2017-07-28 DIAGNOSIS — C3490 Malignant neoplasm of unspecified part of unspecified bronchus or lung: Secondary | ICD-10-CM | POA: Diagnosis present

## 2017-07-28 DIAGNOSIS — Z794 Long term (current) use of insulin: Secondary | ICD-10-CM | POA: Diagnosis not present

## 2017-07-28 DIAGNOSIS — K8020 Calculus of gallbladder without cholecystitis without obstruction: Secondary | ICD-10-CM | POA: Diagnosis present

## 2017-07-28 DIAGNOSIS — E039 Hypothyroidism, unspecified: Secondary | ICD-10-CM

## 2017-07-28 DIAGNOSIS — E669 Obesity, unspecified: Secondary | ICD-10-CM | POA: Diagnosis present

## 2017-07-28 DIAGNOSIS — E0800 Diabetes mellitus due to underlying condition with hyperosmolarity without nonketotic hyperglycemic-hyperosmolar coma (NKHHC): Secondary | ICD-10-CM

## 2017-07-28 DIAGNOSIS — M940 Chondrocostal junction syndrome [Tietze]: Secondary | ICD-10-CM | POA: Diagnosis present

## 2017-07-28 DIAGNOSIS — K5900 Constipation, unspecified: Secondary | ICD-10-CM | POA: Diagnosis present

## 2017-07-28 DIAGNOSIS — M1990 Unspecified osteoarthritis, unspecified site: Secondary | ICD-10-CM | POA: Diagnosis present

## 2017-07-28 DIAGNOSIS — I340 Nonrheumatic mitral (valve) insufficiency: Secondary | ICD-10-CM | POA: Diagnosis not present

## 2017-07-28 DIAGNOSIS — E032 Hypothyroidism due to medicaments and other exogenous substances: Secondary | ICD-10-CM | POA: Diagnosis not present

## 2017-07-28 DIAGNOSIS — R079 Chest pain, unspecified: Secondary | ICD-10-CM | POA: Diagnosis not present

## 2017-07-28 DIAGNOSIS — R791 Abnormal coagulation profile: Secondary | ICD-10-CM | POA: Diagnosis present

## 2017-07-28 DIAGNOSIS — G893 Neoplasm related pain (acute) (chronic): Secondary | ICD-10-CM | POA: Diagnosis present

## 2017-07-28 DIAGNOSIS — J45909 Unspecified asthma, uncomplicated: Secondary | ICD-10-CM | POA: Diagnosis present

## 2017-07-28 DIAGNOSIS — Z7982 Long term (current) use of aspirin: Secondary | ICD-10-CM | POA: Diagnosis not present

## 2017-07-28 DIAGNOSIS — I428 Other cardiomyopathies: Secondary | ICD-10-CM | POA: Diagnosis present

## 2017-07-28 DIAGNOSIS — I110 Hypertensive heart disease with heart failure: Secondary | ICD-10-CM | POA: Diagnosis present

## 2017-07-28 DIAGNOSIS — C792 Secondary malignant neoplasm of skin: Secondary | ICD-10-CM | POA: Diagnosis present

## 2017-07-28 DIAGNOSIS — E7800 Pure hypercholesterolemia, unspecified: Secondary | ICD-10-CM | POA: Diagnosis present

## 2017-07-28 DIAGNOSIS — E1140 Type 2 diabetes mellitus with diabetic neuropathy, unspecified: Secondary | ICD-10-CM | POA: Diagnosis present

## 2017-07-28 DIAGNOSIS — I5022 Chronic systolic (congestive) heart failure: Secondary | ICD-10-CM | POA: Diagnosis present

## 2017-07-28 DIAGNOSIS — Z79890 Hormone replacement therapy (postmenopausal): Secondary | ICD-10-CM | POA: Diagnosis not present

## 2017-07-28 DIAGNOSIS — Z8249 Family history of ischemic heart disease and other diseases of the circulatory system: Secondary | ICD-10-CM | POA: Diagnosis not present

## 2017-07-28 DIAGNOSIS — E785 Hyperlipidemia, unspecified: Secondary | ICD-10-CM | POA: Diagnosis present

## 2017-07-28 DIAGNOSIS — K219 Gastro-esophageal reflux disease without esophagitis: Secondary | ICD-10-CM | POA: Diagnosis present

## 2017-07-28 DIAGNOSIS — R1032 Left lower quadrant pain: Secondary | ICD-10-CM

## 2017-07-28 LAB — DIFFERENTIAL
Basophils Absolute: 0 10*3/uL (ref 0.0–0.1)
Basophils Relative: 0 %
EOS PCT: 6 %
Eosinophils Absolute: 0.3 10*3/uL (ref 0.0–0.7)
LYMPHS PCT: 20 %
Lymphs Abs: 1.1 10*3/uL (ref 0.7–4.0)
MONO ABS: 0.3 10*3/uL (ref 0.1–1.0)
Monocytes Relative: 5 %
Neutro Abs: 3.9 10*3/uL (ref 1.7–7.7)
Neutrophils Relative %: 69 %

## 2017-07-28 LAB — CBC
HCT: 27.9 % — ABNORMAL LOW (ref 36.0–46.0)
HEMOGLOBIN: 8.9 g/dL — AB (ref 12.0–15.0)
MCH: 28.3 pg (ref 26.0–34.0)
MCHC: 31.9 g/dL (ref 30.0–36.0)
MCV: 88.9 fL (ref 78.0–100.0)
PLATELETS: 244 10*3/uL (ref 150–400)
RBC: 3.14 MIL/uL — ABNORMAL LOW (ref 3.87–5.11)
RDW: 12.6 % (ref 11.5–15.5)
WBC: 5.7 10*3/uL (ref 4.0–10.5)

## 2017-07-28 LAB — URINALYSIS, ROUTINE W REFLEX MICROSCOPIC
BILIRUBIN URINE: NEGATIVE
GLUCOSE, UA: NEGATIVE mg/dL
HGB URINE DIPSTICK: NEGATIVE
Ketones, ur: NEGATIVE mg/dL
NITRITE: NEGATIVE
PROTEIN: NEGATIVE mg/dL
Specific Gravity, Urine: 1.005 (ref 1.005–1.030)
pH: 6 (ref 5.0–8.0)

## 2017-07-28 LAB — LIPASE, BLOOD: LIPASE: 20 U/L (ref 11–51)

## 2017-07-28 LAB — ECHOCARDIOGRAM COMPLETE
Height: 71 in
Weight: 4260.8 oz

## 2017-07-28 LAB — COMPREHENSIVE METABOLIC PANEL
ALBUMIN: 2.6 g/dL — AB (ref 3.5–5.0)
ALT: 11 U/L — ABNORMAL LOW (ref 14–54)
ANION GAP: 9 (ref 5–15)
AST: 12 U/L — AB (ref 15–41)
Alkaline Phosphatase: 72 U/L (ref 38–126)
BILIRUBIN TOTAL: 0.2 mg/dL — AB (ref 0.3–1.2)
BUN: 7 mg/dL (ref 6–20)
CALCIUM: 8.1 mg/dL — AB (ref 8.9–10.3)
CO2: 23 mmol/L (ref 22–32)
Chloride: 105 mmol/L (ref 101–111)
Creatinine, Ser: 0.7 mg/dL (ref 0.44–1.00)
GFR calc Af Amer: >60 mL/min (ref >60)
GFR calc non Af Amer: >60 mL/min (ref >60)
GLUCOSE: 256 mg/dL — AB (ref 65–99)
Potassium: 4 mmol/L (ref 3.5–5.1)
Sodium: 137 mmol/L (ref 135–145)
TOTAL PROTEIN: 6.2 g/dL — AB (ref 6.5–8.1)

## 2017-07-28 LAB — CREATININE, SERUM
CREATININE: 0.74 mg/dL (ref 0.44–1.00)
GFR calc Af Amer: >60 mL/min (ref >60)

## 2017-07-28 LAB — MAGNESIUM: Magnesium: 1.1 mg/dL — ABNORMAL LOW (ref 1.7–2.4)

## 2017-07-28 LAB — GLUCOSE, CAPILLARY
GLUCOSE-CAPILLARY: 126 mg/dL — AB (ref 65–99)
Glucose-Capillary: 268 mg/dL — ABNORMAL HIGH (ref 65–99)
Glucose-Capillary: 181 mg/dL — ABNORMAL HIGH (ref 65–99)
Glucose-Capillary: 129 mg/dL — ABNORMAL HIGH (ref 65–99)

## 2017-07-28 LAB — TROPONIN I: Troponin I: 0.05 ng/mL

## 2017-07-28 LAB — PHOSPHORUS: Phosphorus: 3.6 mg/dL (ref 2.5–4.6)

## 2017-07-28 MED ORDER — IOPAMIDOL (ISOVUE-300) INJECTION 61%
INTRAVENOUS | Status: AC
  Administered 2017-07-28: 30 mL via ORAL
  Filled 2017-07-28: qty 30

## 2017-07-28 MED ORDER — IOPAMIDOL (ISOVUE-300) INJECTION 61%
30.0000 mL | Freq: Once | INTRAVENOUS | Status: AC
  Administered 2017-07-28: 30 mL via ORAL

## 2017-07-28 MED ORDER — MAGNESIUM SULFATE 4 GM/100ML IV SOLN
4.0000 g | Freq: Once | INTRAVENOUS | Status: AC
  Administered 2017-07-28: 4 g via INTRAVENOUS
  Filled 2017-07-28: qty 100

## 2017-07-28 MED ORDER — INSULIN GLARGINE 100 UNIT/ML ~~LOC~~ SOLN
25.0000 [IU] | Freq: Every day | SUBCUTANEOUS | Status: DC
  Administered 2017-07-28 – 2017-07-29 (×2): 25 [IU] via SUBCUTANEOUS
  Filled 2017-07-28 (×2): qty 0.25

## 2017-07-28 MED ORDER — SODIUM CHLORIDE 0.9% FLUSH: 10.0000 mL | INTRAVENOUS | Status: DC | PRN

## 2017-07-28 NOTE — Progress Notes (Signed)
  Echocardiogram 2D Echocardiogram has been performed.  Raman Featherston T Alanis Clift 07/28/2017, 2:22 PM

## 2017-07-28 NOTE — Progress Notes (Signed)
Progress Note  Patient Name: Marilyn [name] Date of Encounter: 07/28/2017  Primary Cardiologist: Pixie Casino, MD  Subjective   Hurts to take a deep breath. Chest wall is tender. Has abdominal pain at times, does not feel like eating  Inpatient Medications    Scheduled Meds: . aspirin EC  81 mg Oral Daily  . DULoxetine  60 mg Oral Daily  . heparin  5,000 Units Subcutaneous Q8H  . insulin aspart  0-15 Units Subcutaneous TID WC  . insulin aspart  0-5 Units Subcutaneous QHS  . levothyroxine  150 mcg Oral Q0600  . losartan  25 mg Oral Daily  . metoprolol tartrate  25 mg Oral BID  . pantoprazole  40 mg Oral Daily   Continuous Infusions:  PRN Meds: acetaminophen, levalbuterol, methocarbamol, ondansetron (ZOFRAN) IV, oxyCODONE-acetaminophen, senna, sodium chloride flush, traZODone   Vital Signs    Vitals:   07/27/17 1748 07/27/17 2154 07/28/17 0005 07/28/17 0446  BP: 130/89 129/78 130/68 102/69  Pulse: (!) 105 (!) 103 (!) 108 (!) 101  Resp: 16 (!) 22 20 (!) 21  Temp: 97.8 F (36.6 C) 98.4 F (36.9 C) 99.2 F (37.3 C) 98.1 F (36.7 C)  TempSrc: Oral  Oral Oral  SpO2: 99% 100% 96% 100%  Weight:      Height:        Intake/Output Summary (Last 24 hours) at 07/28/2017 1021 Last data filed at 07/28/2017 0643 Gross per 24 hour  Intake 1764.58 ml  Output -  Net 1764.58 ml   Filed Weights   07/26/17 2326  Weight: 266 lb 4.8 oz (120.8 kg)    Telemetry    ST - Personally Reviewed  ECG    04/24 ST, HR 117, low voltage precordial leads. Diffuse, minimal ST elevation - Personally Reviewed  Physical Exam   General: Well developed, well nourished, female appearing in no acute distress. Head: Normocephalic, atraumatic.  Neck: Supple without bruits, JVD not see elevated. Lungs:  Resp regular and unlabored, slight exp wheeze, poor inspiratory effort due to pain. +++chest wall tenderness Heart: RRR, S1, S2, no S3, S4, or murmur; no rub heard. Abdomen: Soft,  non-tender, non-distended with normoactive bowel sounds. No hepatomegaly. No rebound/guarding. No obvious abdominal masses. Extremities: No clubbing, cyanosis, no edema. Distal pedal pulses are 2+ bilaterally. Neuro: Alert and oriented X 3. Moves all extremities spontaneously. Psych: Normal affect.  Labs    Hematology Recent Labs  Lab 07/25/17 6962 07/27/17 0158 07/27/17 0207 07/28/17 0859  WBC 6.1 7.2  --  5.7  RBC 3.46*  3.46* 3.26*  --  3.14*  HGB 10.0* 9.4* 8.8* 8.9*  HCT 31.4* 29.2* 26.0* 27.9*  MCV 90.8 89.6  --  88.9  MCH 28.9 28.8  --  28.3  MCHC 31.8 32.2  --  31.9  RDW 12.8 12.8  --  12.6  PLT 247 288  --  244    Chemistry Recent Labs  Lab 07/25/17 0951 07/27/17 0207 07/28/17 0859  NA 140 141 137  K 3.9 3.5 4.0  CL 106 105 105  CO2 25  --  23  GLUCOSE 225* 247* 256*  BUN 8 5* 7  CREATININE 0.82 0.60 0.70  0.74  CALCIUM 8.8  --  8.1*  PROT 7.5  --  6.2*  ALBUMIN 3.1*  --  2.6*  AST 14  --  12*  ALT 12  --  11*  ALKPHOS 89  --  72  BILITOT <0.2*  --  0.2*  GFRNONAA >60  --  >60  >60  GFRAA >60  --  >60  >60  ANIONGAP 9  --  9     Cardiac Enzymes Recent Labs  Lab 07/27/17 0158 07/27/17 1447 07/27/17 2339  TROPONINI 0.06* 0.04* 0.05*    Recent Labs  Lab 07/27/17 0649  TROPIPOC 0.04     BNP Recent Labs  Lab 07/27/17 0158  BNP 43.2     DDimer  Recent Labs  Lab 07/27/17 0158  DDIMER 2.08*     Radiology    Dg Chest 2 View  Result Date: 07/27/2017 CLINICAL DATA:  Lung cancer. Increasing shortness of breath and fever. EXAM: CHEST - 2 VIEW COMPARISON:  Chest 06/21/2017.  CT chest 06/21/2017 FINDINGS: Shallow inspiration. Linear atelectasis in the lung bases. Right suprahilar/paratracheal fullness is unchanged since previous study. This corresponds to medial consolidation on previous CT, likely due to postradiation changes. No acute consolidation or airspace disease is appreciated. There is blunting of the costophrenic angles which  may represent small pleural effusions versus pleural thickening. No pneumothorax. Heart size and pulmonary vascularity are normal. IMPRESSION: Chronic right hilar/right paratracheal changes similar to previous study. Shallow inspiration with atelectasis in the lung bases. No significant change. Electronically Signed   By: Lucienne Capers M.D.   On: 07/27/2017 01:32   Ct Angio Chest Pe W Or Wo Contrast  Result Date: 07/27/2017 CLINICAL DATA:  52 year old female with a history of lung cancer and new onset chest pain concerning for pulmonary embolus. EXAM: CT ANGIOGRAPHY CHEST WITH CONTRAST TECHNIQUE: Multidetector CT imaging of the chest was performed using the standard protocol during bolus administration of intravenous contrast. Multiplanar CT image reconstructions and MIPs were obtained to evaluate the vascular anatomy. CONTRAST:  60m ISOVUE-370 IOPAMIDOL (ISOVUE-370) INJECTION 76% COMPARISON:  Numerous prior CT scans of the chest, most recent prior CT PE study 06/21/2017. FINDINGS: Cardiovascular: Adequate opacification of the pulmonary arteries to the proximal subsegmental level. No evidence of central filling defect to suggest acute pulmonary embolus. Conventional 3 vessel arch anatomy. No evidence of aortic dissection or aneurysm. There is a right IJ approach port catheter. The catheter tip terminates in the right atrium. The heart is normal in size. No pericardial effusion. Mediastinum/Nodes: Stable low right paratracheal nodal mass measuring 2.2 x 2.6 cm (previously 2.0 x 2.5 cm). Slight interval difference may be due to measurement technique rather than true interval growth. There is a large right axillary lymph node measuring 2.7 by 2.9 cm compared to 2.5 x 2.5 cm previously. Unremarkable thoracic esophagus. Lungs/Pleura: Significant atelectasis/scarring and cicatrization of the medial aspect of the right upper and middle lobes consistent with prior radiation changes. This is stable across multiple  prior studies. Mild dependent atelectasis is present in both lower lobes, also similar compared to prior studies. Essentially unchanged 6 mm nodule in the left lower lobe dating back to at least 05/11/2017. No pleural effusion or pneumothorax. Upper Abdomen: No acute abnormality. Musculoskeletal: No chest wall abnormality. No acute or significant osseous findings. Review of the MIP images confirms the above findings. IMPRESSION: 1. Negative for acute pulmonary embolus, pneumonia or other acute cardiopulmonary process. 2. Stable to incremental enlargement of the likely metastatic right axillary lymph node which now measures 2.7 x 2.9 cm compared to 2.7 x 2.6 cm on 06/21/2017. No additional evidence of disease progression or other metastatic disease. 3. Stable to incremental enlargement of low right paratracheal nodal mass. 4. Stable post radiation changes with scarring/fibrosis in the medial aspects of  the right upper and middle lobes confluent with the right hilum. 5. Similar degree of mild bibasilar subsegmental atelectasis compared to prior studies. 6. Stable left lower lobe pulmonary nodule. Electronically Signed   By: Jacqulynn Cadet M.D.   On: 07/27/2017 09:08     Cardiac Studies   ECHO:  07/27/2017 - Left ventricle: The cavity size was normal. Systolic function was   normal. The estimated ejection fraction was in the range of 55%   to 60%. Wall motion was normal; there were no regional wall   motion abnormalities. Left ventricular diastolic function   parameters were normal. - Aortic valve: Trileaflet; mildly thickened, mildly calcified   leaflets. There was mild regurgitation.  Patient Profile     52 y.o. female w/ hx  metastatic lung cancer, history of possible asthma, presumed NICM (EF 45-50% by echo 10/2016, 52% by low risk nuc 11/2016, 55-60% 07/2017) without prior syndrome of CHF, sinus tachycardia, DM, HLD, HTN, neuropathy, obesity, anemia, ST.  Admitted 04/24 with CP, elevated  d-dimer, temp 100.2, DOE   Assessment & Plan    1.Chest pain -She has a history of chronic chest pain in the past and also has had an elevated troponin in the past when her cancer was first diagnosed -Chest pain is atypical, nl MV 11/2016, no evidence of coronary calcifications on chest  - CT chest pain is quite atypical for angina.  -No pericardial effusion was noted on chest CT or echo -She does have some minimal ST elevation diffusely which could be seen with acute pericarditis.   -She was febrile on exam on admission at 100.2 but f/u temp 97.6. -ESR and CRP would likely be elevated due to her underlying malignancy, will not check - EF normal on echo, no WMA - troponin levels are flat, not c/w ACS - she has had radiation to her chest, may have some CP from that, as her pain is more on the R - Pericarditis also possible - discuss colchicine or NSAIDS w/ MD  2. Chronic sinus tachycardia - appears chronic at least back to 2013, even pre-dating diagnosis of mild LV dysfunction or thyroid repletion. May be systemic response to her underlying cancer +/- inappropriate sinus tach.  -carvedilol changed to Lopressor, HR may be a little lower than yesterday, SBP 100s at times so no dose change now.   3. H/o mild CM probable NICM w/ neg MV 2018 - 2D echocardiogram showed normal LV function   4. Metastatic lung cancer -per primary team.  5. Essential HTN with variable readings  - on home dose of losartan, now on Lopressor as well - BP well-controlled  Otherwise, per IM Active Problems:   Chest pain   Diabetes mellitus ([place])   Non-small cell lung cancer ([place])   Constipation   Hypothyroidism   Acute systolic heart failure ([place])    Signed, Rosaria Ferries , PA-C 10:21 AM 07/28/2017 Pager: [phone]

## 2017-07-28 NOTE — Progress Notes (Signed)
PROGRESS NOTE    ROSLIN [name]  ZOX:[id number] DOB: 1966-01-20 DOA: 07/26/2017 PCP: [name], FNP   Brief Narrative:  Marilyn [name] is a morbidly obese 52 y.o.  AAF with medical history significant of lung cancer with mets to skin followed by Dr. Irene Limbo as outpatient, diabetes, systolic chf. Presented with complaints of chest pain. This has been going on since the yesterday days. Nothing she is aware of makes it better or worse. Pain is over left and right breast, descibed as a sudden sharp and grabbing pain. No worsening of pain with deep inspiration. In the ED she was found to have elevated D dimer, chest x ray was non revealing, bnp within normal limits. Admitted for CP and currently being worked up. Today she complained of LLQ Pain so a CT Abd/Pelvis was ordered.  Cardiology is following and evaluating the patient for her chest pain.  Assessment & Plan:   Active Problems:   Chest pain   Diabetes mellitus (HCC)   Non-small cell lung cancer (HCC)   Constipation   Hypothyroidism   Acute systolic heart failure (HCC)  Atypical Chest Pain  -Cardiology was consulted and appreciated evaluation recommendations -Troponin x 3 and was Flat; Troponin went from 0.04 -> 0.04 -> 0.05 -Continue to Monitor on Telemetry  -Recent Nuclear Stress Test showed no inducible Ischemia -CTA as below -Repeat ECHO essentially the same as 07/15/17 and there was no mention of any pericardial effusion -Per cardiology since there is no pericardial effusion left ventricular function was normal no further cardiac work-up is indicated at this time and they feel that the chest pain is most consistent with a musculoskeletal etiology such as costochondritis in the midsternal left sided chest wall or pleuritic component on the right side -Continue to monitor very closely  Lower Left Quadrant Abdominal Pain -Check Bladder Scan -Obtain CT of the Abdomen/Pelvis without contrast as patient is recently had  contrast -CT Scan showed Mild prominence of the right intrarenal collecting system, with right ureter being normal in caliber with no convincing stone. There are 2 stones that lie directly adjacent to the right ureter, but these appear to reside in the gonadal vein, and are stable from the prior CT. Findings support a recently passed stone in the proper clinical setting. Small amount of fluid attenuation tracks along the right lateral abdominal wall musculature, nonspecific. Soft tissue density along the right lateral abdominal wall soft tissues, abutting the underlying musculature, is stable from the prior CT. This may reflect a body wall metastatic lesion as described on prior CTs. Multiple gallstones.  No evidence of acute cholecystitis. Small right pleural effusion, new since the prior study. Stable right lung changes consistent with scarring from prior radiation therapy. Right inferior external iliac and inguinal adenopathy consistent with metastatic disease. The larger conglomeration of inguinal lymph nodes has increased in size from the prior CT as detailed above. -*Unclear Etiology for LLQ Pain given CT Findings  -Check Urinalysis as well as Urine Culture; Pending  -Continue pain control with Oxycodone-Acetaminophen 1 tab p.o. every 6 as needed moderate pain  Elevated D-Dimer -D-Dimer was 2.08 -CTA of the Chest was Negative for acute pulmonary embolus, pneumonia or other acute cardiopulmonary process. Stable to incremental enlargement of the likely metastatic right axillary lymph node which now measures 2.7 x 2.9 cm compared to 2.7 x 2.6 cm on 06/21/2017. No additional evidence of disease progression or other metastatic disease. Stable to incremental enlargement of low right paratracheal nodal mass. Stable  post radiation changes with scarring/fibrosis in the medial aspects of the right upper and middle lobes confluent with the right hilum. Similar degree of mild bibasilar subsegmental  atelectasis compared to prior studies. Stable left lower lobe pulmonary nodule  Diabetes Mellitus -Home insulin with Tyler Aas held currently as it is not on the hospital formulary as well as home NovoLog sliding scale -Placed on a Carb Modified Diet -Placed on insulin glargine 25 units subcu daily as well moderate NovoLog sliding scale insulin before meals and at bedtime -Check Hemoglobin A1c in the AM -CBGs have been ranging from 129-268  Non-Small Cell Lung Cancer  -Followed by Dr. Irene Limbo as outpatient -Likely contributing to principle problem will have to r/o other causes first. -Need to follow-up with Dr. Irene Limbo as an outpatient  Constipation -Continue with Senna 8.6 mg p.o. daily as needed mild constipation  Hypothyroidism -Continue Levothyroxine 150 mcg p.o. daily   Chronic Systolic Heart Failure -Repeat echo shows EF of 55 to 60% consistent with the one prior study on 07/15/2017 and left ventricular diastolic function parameters were normal -BNP was 43.2 -Strict I's and O's, Daily Weights, SLIV -Continue with metoprolol 25 minutes p.o. twice daily as well as losartan 25 mg p.o. daily  Hypomagnesemia -Patient's Magnesium level as it was 1.1 -Replete with IV mag sulfate 4 g -Continue to monitor and replete as necessary -Repeat mag level in AM.  GERD -Continue with Pantoprazole 40 mg p.o. Daily  Essential Hypertension -Continue with metoprolol 25 movement p.o. twice daily as well as losartan 25 mg p.o. 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  Radford Pax    Procedures:  ECHOCARDIOGRAM ------------------------------------------------------------------- Study Conclusions  - Left ventricle: The cavity size was normal. Wall thickness was   increased in a pattern of mild LVH. Systolic function was normal.   The estimated ejection fraction was in the range of 55% to 60%.   Wall motion was normal; there were no regional wall motion   abnormalities. Left ventricular diastolic function parameters   were normal. - Aortic valve: Sclerosis without stenosis. There was mild   regurgitation. Valve area (Vmax): 0.78 cm^2. - Mitral valve: Mildly thickened leaflets . There was mild   regurgitation. - Left atrium: The atrium was normal in size. - Tricuspid valve: There was trivial regurgitation. - Pulmonary arteries: PA peak pressure: 25 mm Hg (S). - Inferior vena cava: The vessel was normal in size. The   respirophasic diameter changes were in the normal range (>= 50%),   consistent with normal central venous pressure.  Impressions:  - Compared to a prior study on 07/15/17, there has been no change.   Antimicrobials:  Anti-infectives (From admission, onward)   None     Subjective: Patient was seen and examined at bedside this a.m. and was complaining of left lower quadrant abdominal pain that was.  She states there is a sharp pain.  She states she also has right-sided back pain which she feels is likely secondary to the metastasis from her lung cancer.  She denies any nausea, vomiting, or lightheadedness.  Does have some intermittent chest pain however may complaint  today was abdominal pain  Objective: Vitals:   07/27/17 2154 07/28/17 0005 07/28/17 0446 07/28/17 1246  BP: 129/78 130/68 102/69 130/76  Pulse: (!) 103 (!) 108 (!) 101 91  Resp: (!) 22 20 (!) 21 20  Temp: 98.4 F (36.9 C) 99.2 F (37.3 C) 98.1 F (36.7 C) 97.9 F (36.6 C)  TempSrc:  Oral Oral Oral  SpO2: 100% 96% 100% 97%  Weight:      Height:         Intake/Output Summary (Last 24 hours) at 07/28/2017 1720 Last data filed at 07/28/2017 1439 Gross per 24 hour  Intake 1764.58 ml  Output 350 ml  Net 1414.58 ml   Filed Weights   07/26/17 2326  Weight: 120.8 kg (266 lb 4.8 oz)  \ Examination: Physical Exam:  Constitutional: WN/WD obese AAF appears calm but uncomfortable Eyes: Lids and conjunctivae normal, sclerae anicteric  ENMT: External Ears, Nose appear normal. Grossly normal hearing. Mucous membranes are moist.  Neck: Appears normal, supple, no cervical masses, normal ROM, no appreciable thyromegaly; no JVD Respiratory: Diminished to auscultation bilaterally, no wheezing, rales, rhonchi or crackles. Normal respiratory effort and patient is not tachypenic. No accessory muscle use.  Cardiovascular: Slightly tachycardic but regular rhythm; No no murmurs / rubs / gallops. S1 and S2 auscultated. No extremity edema.  Abdomen: Soft, Tender to palpate especially in LLQ/Inguinal Area, Distended 2/2 body habitus. No masses palpated. No appreciable hepatosplenomegaly. Bowel sounds positive x4.  GU: Deferred. Musculoskeletal: No clubbing / cyanosis of digits/nails. No joint deformity upper and lower extremities.  Skin: No rashes, lesions, ulcers on a limited skin evaluation. No induration; Warm and dry.  Neurologic: CN 2-12 grossly intact with no focal deficits.Romberg sign and cerebellar reflexes not assessed.  Psychiatric: Normal judgment and insight. Alert and oriented x 3. Normal mood and appropriate affect.   Data Reviewed: I have personally reviewed following labs and imaging studies  CBC: Recent Labs  Lab 07/25/17 0951 07/27/17 0158 07/27/17 0207 07/28/17 0859  WBC 6.1 7.2  --  5.7  NEUTROABS 4.4 5.2  --  3.9  HGB 10.0* 9.4* 8.8* 8.9*  HCT 31.4* 29.2* 26.0* 27.9*  MCV 90.8 89.6  --  88.9  PLT 247 288  --  678   Basic Metabolic Panel: Recent Labs  Lab 07/25/17 0951 07/27/17 0207 07/28/17 0859  NA 140 141 137  K 3.9  3.5 4.0  CL 106 105 105  CO2 25  --  23  GLUCOSE 225* 247* 256*  BUN 8 5* 7  CREATININE 0.82 0.60 0.70  0.74  CALCIUM 8.8  --  8.1*  MG  --   --  1.1*  PHOS  --   --  3.6   GFR: Estimated Creatinine Clearance: 121.9 mL/min (by C-G formula based on SCr of 0.74 mg/dL). Liver Function Tests: Recent Labs  Lab 07/25/17 0951 07/28/17 0859  AST 14 12*  ALT 12 11*  ALKPHOS 89 72  BILITOT <0.2* 0.2*  PROT 7.5 6.2*  ALBUMIN 3.1* 2.6*   Recent Labs  Lab 07/28/17 0859  LIPASE 20   No results for input(s): AMMONIA in the last 168 hours. Coagulation Profile: Recent Labs  Lab 07/27/17 0158  INR 0.97   Cardiac Enzymes: Recent Labs  Lab 07/27/17 0158 07/27/17 1447 07/27/17 2339  TROPONINI 0.06* 0.04* 0.05*   BNP (last 3 results) No results for input(s): PROBNP in the last 8760 hours. HbA1C: No results for input(s): HGBA1C in the last 72 hours. CBG: Recent  Labs  Lab 07/27/17 1224 07/27/17 1649 07/27/17 2151 07/28/17 0734 07/28/17 1143  GLUCAP 296* 243* 250* 268* 181*   Lipid Profile: No results for input(s): CHOL, HDL, LDLCALC, TRIG, CHOLHDL, LDLDIRECT in the last 72 hours. Thyroid Function Tests: No results for input(s): TSH, T4TOTAL, FREET4, T3FREE, THYROIDAB in the last 72 hours. Anemia Panel: No results for input(s): VITAMINB12, FOLATE, FERRITIN, TIBC, IRON, RETICCTPCT in the last 72 hours. Sepsis Labs: No results for input(s): PROCALCITON, LATICACIDVEN in the last 168 hours.  No results found for this or any previous visit (from the past 240 hour(s)).   Radiology Studies: Ct Abdomen Pelvis Wo Contrast  Result Date: 07/28/2017 CLINICAL DATA:  chief complaint of chest pain. Patient has history of CHF, diabetes, lung cancer -with metastases to right flank. Patient states that she started having sudden onset chest pain Tuesday/ Pain initially was in the right flank but then also moved anteriorly. Pain is described as "sharp and grabbing pain". EXAM: CT ABDOMEN  AND PELVIS WITHOUT CONTRAST TECHNIQUE: Multidetector CT imaging of the abdomen and pelvis was performed following the standard protocol without IV contrast. COMPARISON:  Chest, abdomen and pelvis CT, 05/11/2016. FINDINGS: Lower chest: Small right pleural effusion, new since the prior CT. There is opacity extending posteriorly and laterally from the inferior right hilum consistent with treatment related scarring, stable from the prior CT. Additional scarring and/or atelectasis is noted at the lung bases. Hepatobiliary: Liver is unremarkable. There are multiple gallstones. No evidence of acute cholecystitis. No bile duct dilation. Pancreas: Unremarkable. No pancreatic ductal dilatation or surrounding inflammatory changes. Spleen: Normal in size without focal abnormality. Adrenals/Urinary Tract: No adrenal masses. Mild prominence of the right intrarenal collecting system. Subtle haziness is noted along the right renal contour, mostly inferiorly. No renal masses. No intrarenal stones. Normal left intrarenal collecting system. There are 2 calcifications adjacent to the right ureter which project within the gonadal vein. No convincing right ureteral stone. Both ureters are normal course and caliber. No bladder wall thickening, mass or stone. Stomach/Bowel: Stomach is within normal limits. Appendix appears normal. No evidence of bowel wall thickening, distention, or inflammatory changes. Vascular/Lymphatic: Right inguinal mass measuring 5 x 3.4 x 3.9 cm, previously 4.3 x 2.8 x 3.4 cm. This is consistent with an enlarged node or conglomeration of nodes. Right external iliac node lies adjacent to this measuring 16 mm in short axis, unchanged. No other adenopathy. Minor aortic atherosclerotic calcifications.  No aneurysm. Reproductive: Uterus mildly enlarged by a poorly defined fibroid, stable from the prior study. No ovarian/adnexal masses. Other: No hernia or ascites. Fluid attenuation tracks along the right lateral  abdominal wall musculature. There is an area of soft tissue attenuation in the deep subcutaneous fat of the right flank, stable from the prior CT. This measures 4.9 x 3.0 x 2.9 cm. Musculoskeletal: No fracture or acute finding. No osteoblastic or osteolytic lesions. IMPRESSION: 1. Mild prominence of the right intrarenal collecting system, with right ureter being normal in caliber with no convincing stone. There are 2 stones that lie directly adjacent to the right ureter, but these appear to reside in the gonadal vein, and are stable from the prior CT. Findings support a recently passed stone in the proper clinical setting. 2. Small amount of fluid attenuation tracks along the right lateral abdominal wall musculature, nonspecific. 3. Soft tissue density along the right lateral abdominal wall soft tissues, abutting the underlying musculature, is stable from the prior CT. This may reflect a body wall metastatic lesion as  described on prior CTs. 4. Multiple gallstones.  No evidence of acute cholecystitis. 5. Small right pleural effusion, new since the prior study. Stable right lung changes consistent with scarring from prior radiation therapy. 6. Right inferior external iliac and inguinal adenopathy consistent with metastatic disease. The larger conglomeration of inguinal lymph nodes has increased in size from the prior CT as detailed above. Electronically Signed   By: Lajean Manes M.D.   On: 07/28/2017 16:50   Dg Chest 2 View  Result Date: 07/27/2017 CLINICAL DATA:  Lung cancer. Increasing shortness of breath and fever. EXAM: CHEST - 2 VIEW COMPARISON:  Chest 06/21/2017.  CT chest 06/21/2017 FINDINGS: Shallow inspiration. Linear atelectasis in the lung bases. Right suprahilar/paratracheal fullness is unchanged since previous study. This corresponds to medial consolidation on previous CT, likely due to postradiation changes. No acute consolidation or airspace disease is appreciated. There is blunting of the  costophrenic angles which may represent small pleural effusions versus pleural thickening. No pneumothorax. Heart size and pulmonary vascularity are normal. IMPRESSION: Chronic right hilar/right paratracheal changes similar to previous study. Shallow inspiration with atelectasis in the lung bases. No significant change. Electronically Signed   By: Lucienne Capers M.D.   On: 07/27/2017 01:32   Ct Angio Chest Pe W Or Wo Contrast  Result Date: 07/27/2017 CLINICAL DATA:  52 year old female with a history of lung cancer and new onset chest pain concerning for pulmonary embolus. EXAM: CT ANGIOGRAPHY CHEST WITH CONTRAST TECHNIQUE: Multidetector CT imaging of the chest was performed using the standard protocol during bolus administration of intravenous contrast. Multiplanar CT image reconstructions and MIPs were obtained to evaluate the vascular anatomy. CONTRAST:  42mL ISOVUE-370 IOPAMIDOL (ISOVUE-370) INJECTION 76% COMPARISON:  Numerous prior CT scans of the chest, most recent prior CT PE study 06/21/2017. FINDINGS: Cardiovascular: Adequate opacification of the pulmonary arteries to the proximal subsegmental level. No evidence of central filling defect to suggest acute pulmonary embolus. Conventional 3 vessel arch anatomy. No evidence of aortic dissection or aneurysm. There is a right IJ approach port catheter. The catheter tip terminates in the right atrium. The heart is normal in size. No pericardial effusion. Mediastinum/Nodes: Stable low right paratracheal nodal mass measuring 2.2 x 2.6 cm (previously 2.0 x 2.5 cm). Slight interval difference may be due to measurement technique rather than true interval growth. There is a large right axillary lymph node measuring 2.7 by 2.9 cm compared to 2.5 x 2.5 cm previously. Unremarkable thoracic esophagus. Lungs/Pleura: Significant atelectasis/scarring and cicatrization of the medial aspect of the right upper and middle lobes consistent with prior radiation changes. This is  stable across multiple prior studies. Mild dependent atelectasis is present in both lower lobes, also similar compared to prior studies. Essentially unchanged 6 mm nodule in the left lower lobe dating back to at least 05/11/2017. No pleural effusion or pneumothorax. Upper Abdomen: No acute abnormality. Musculoskeletal: No chest wall abnormality. No acute or significant osseous findings. Review of the MIP images confirms the above findings. IMPRESSION: 1. Negative for acute pulmonary embolus, pneumonia or other acute cardiopulmonary process. 2. Stable to incremental enlargement of the likely metastatic right axillary lymph node which now measures 2.7 x 2.9 cm compared to 2.7 x 2.6 cm on 06/21/2017. No additional evidence of disease progression or other metastatic disease. 3. Stable to incremental enlargement of low right paratracheal nodal mass. 4. Stable post radiation changes with scarring/fibrosis in the medial aspects of the right upper and middle lobes confluent with the right hilum. 5. Similar degree  of mild bibasilar subsegmental atelectasis compared to prior studies. 6. Stable left lower lobe pulmonary nodule. Electronically Signed   By: Jacqulynn Cadet M.D.   On: 07/27/2017 09:08   Scheduled Meds: . aspirin EC  81 mg Oral Daily  . DULoxetine  60 mg Oral Daily  . heparin  5,000 Units Subcutaneous Q8H  . insulin aspart  0-15 Units Subcutaneous TID WC  . insulin aspart  0-5 Units Subcutaneous QHS  . insulin glargine  25 Units Subcutaneous Daily  . levothyroxine  150 mcg Oral Q0600  . losartan  25 mg Oral Daily  . metoprolol tartrate  25 mg Oral BID  . pantoprazole  40 mg Oral Daily   Continuous Infusions:   LOS: 0 days   Kerney Elbe, DO [facility] Pager [phone]  If 7PM-7AM, please contact night-coverage [email or web address] Password [facility] 07/28/2017, 5:21 PM

## 2017-07-28 NOTE — Progress Notes (Signed)
Inpatient Diabetes Program Recommendations  AACE/ADA: New Consensus Statement on Inpatient Glycemic Control (2015)  Target Ranges:  Prepandial:   less than 140 mg/dL      Peak postprandial:   less than 180 mg/dL (1-2 hours)      Critically ill patients:  140 - 180 mg/dL   Lab Results  Component Value Date   GLUCAP 268 (H) 07/28/2017   HGBA1C 10.0 (H) 03/25/2017    Review of Glycemic Control  Diabetes history: DM2 Outpatient Diabetes medications: Tresiba 100 units QAM, Novolog 0-10 units tidwc Current orders for Inpatient glycemic control: Novolog 0-15 units tidwc and hs  HgbA1C - 10.0% - from 03/25/2017  Inpatient Diabetes Program Recommendations:     Add Lantus 25 units Q24H Need updated HgbA1C  Continue to follow.   Thank you. Lorenda Peck, RD, LDN, CDE Inpatient Diabetes Coordinator [phone]

## 2017-07-28 NOTE — Telephone Encounter (Signed)
Error opening  

## 2017-07-29 ENCOUNTER — Inpatient Hospital Stay (HOSPITAL_COMMUNITY): Payer: Medicaid Other

## 2017-07-29 LAB — CBC WITH DIFFERENTIAL/PLATELET
Basophils Absolute: 0 10*3/uL (ref 0.0–0.1)
Basophils Relative: 0 %
EOS ABS: 0.3 10*3/uL (ref 0.0–0.7)
EOS PCT: 7 %
HCT: 26.3 % — ABNORMAL LOW (ref 36.0–46.0)
Hemoglobin: 8.5 g/dL — ABNORMAL LOW (ref 12.0–15.0)
LYMPHS ABS: 1.1 10*3/uL (ref 0.7–4.0)
LYMPHS PCT: 21 %
MCH: 28.7 pg (ref 26.0–34.0)
MCHC: 32.3 g/dL (ref 30.0–36.0)
MCV: 88.9 fL (ref 78.0–100.0)
MONO ABS: 0.3 10*3/uL (ref 0.1–1.0)
MONOS PCT: 7 %
Neutro Abs: 3.4 10*3/uL (ref 1.7–7.7)
Neutrophils Relative %: 65 %
PLATELETS: 245 10*3/uL (ref 150–400)
RBC: 2.96 MIL/uL — AB (ref 3.87–5.11)
RDW: 12.6 % (ref 11.5–15.5)
WBC: 5.1 10*3/uL (ref 4.0–10.5)

## 2017-07-29 LAB — COMPREHENSIVE METABOLIC PANEL
ALK PHOS: 67 U/L (ref 38–126)
ALT: 11 U/L — AB (ref 14–54)
ANION GAP: 8 (ref 5–15)
AST: 14 U/L — ABNORMAL LOW (ref 15–41)
Albumin: 2.5 g/dL — ABNORMAL LOW (ref 3.5–5.0)
BUN: 5 mg/dL — ABNORMAL LOW (ref 6–20)
CALCIUM: 7.8 mg/dL — AB (ref 8.9–10.3)
CHLORIDE: 109 mmol/L (ref 101–111)
CO2: 25 mmol/L (ref 22–32)
CREATININE: 0.56 mg/dL (ref 0.44–1.00)
GFR calc Af Amer: >60 mL/min (ref >60)
Glucose, Bld: 114 mg/dL — ABNORMAL HIGH (ref 65–99)
Potassium: 3.6 mmol/L (ref 3.5–5.1)
Sodium: 142 mmol/L (ref 135–145)
Total Bilirubin: 0.6 mg/dL (ref 0.3–1.2)
Total Protein: 6.1 g/dL — ABNORMAL LOW (ref 6.5–8.1)

## 2017-07-29 LAB — PHOSPHORUS: PHOSPHORUS: 3.8 mg/dL (ref 2.5–4.6)

## 2017-07-29 LAB — HEMOGLOBIN A1C
Hgb A1c MFr Bld: 8.9 % — ABNORMAL HIGH (ref 4.8–5.6)
Mean Plasma Glucose: 208.73 mg/dL

## 2017-07-29 LAB — GLUCOSE, CAPILLARY
GLUCOSE-CAPILLARY: 123 mg/dL — AB (ref 65–99)
Glucose-Capillary: 178 mg/dL — ABNORMAL HIGH (ref 65–99)

## 2017-07-29 LAB — HIV ANTIBODY (ROUTINE TESTING W REFLEX): HIV Screen 4th Generation wRfx: NONREACTIVE

## 2017-07-29 LAB — URINE CULTURE

## 2017-07-29 LAB — MAGNESIUM: Magnesium: 2.2 mg/dL (ref 1.7–2.4)

## 2017-07-29 MED ORDER — HEPARIN SOD (PORK) LOCK FLUSH 100 UNIT/ML IV SOLN
500.0000 [IU] | INTRAVENOUS | Status: AC | PRN
  Administered 2017-07-29: 500 [IU]

## 2017-07-29 MED ORDER — METOPROLOL TARTRATE 25 MG PO TABS: 25.0000 mg | ORAL_TABLET | Freq: Two times a day (BID) | ORAL | 0 refills | Status: DC

## 2017-07-29 NOTE — Progress Notes (Signed)
SATURATION QUALIFICATIONS: (This note is used to comply with regulatory documentation for home oxygen)  Patient Saturations on Room Air at Rest = 96%  Patient Saturations on Room Air while Ambulating = 96%  Patient Saturations on 0Liters of oxygen while Ambulating = 96%  Please briefly explain why patient needs home oxygen: Pt complained of SOB but sats remained stable

## 2017-07-29 NOTE — Care Management Note (Signed)
Case Management Note  Patient Details  Name: Marilyn [name] MRN: [id number] Date of Birth: 09-23-1965  Subjective/Objective:                    Action/Plan:d/c home.   Expected Discharge Date:  (UNKNOWN)               Expected Discharge Plan:  Home/Self Care  In-House Referral:     Discharge planning Services  CM Consult  Post Acute Care Choice:    Choice offered to:     DME Arranged:    DME Agency:     HH Arranged:    [place] Agency:     Status of Service:  Completed, signed off  If discussed at [employer] of Stay Meetings, dates discussed:    Additional Comments:  Dessa Phi, RN 07/29/2017, 11:35 AM

## 2017-07-29 NOTE — Discharge Summary (Signed)
Physician Discharge Summary  Marilyn [name] IHK:[id number] DOB: Dec 05, 1965 DOA: 07/26/2017  PCP: [name], FNP  Admit date: 07/26/2017 Discharge date: 07/29/2017  Admitted From: Home Disposition:  Home  Recommendations for Outpatient Follow-up:  1. Follow up with PCP in 1-2 weeks 2. Folow up with Cardiology Clinic in 1-2 weeks 3. Follow up with Oncology Dr. Irene Limbo in 1-2 weeks 4. Please obtain CMP/CBC, Mag, Phos in one week 5. Please follow up on the following pending results:  Home Health: No Equipment/Devices: None   Discharge Condition: Stable CODE STATUS: FULL CODE Diet recommendation: Heart Healthy Diet   Brief/Interim Summary: Marilyn [name] a morbidly obese 52 y.o. AAFwith medical history significant oflung cancer with mets to skin and Right side followed by Dr. Irene Limbo as outpatient, diabetes, systolic chf. Presented with complaints of chest pain. This has been going on since the yesterday. Nothing she is aware of makes it better or worse. Pain is over left and right breast, descibed as a sudden sharp and grabbing pain. No worsening of pain with deep inspiration.In the ED she was found to have elevated D dimer, chest x ray was non revealing, bnp within normal limits.Admitted for CP and worked up. Yesterday she complained of LLQ Pain so a CT Abd/Pelvis was ordered and did not show cause of pain. She improved and Cardiology felt she needed no further cardiac work-up at this time so she deemed medically stable to be discharged home and she will need follow-up with PCP, Cardiology, Oncology in the outpatient setting.  Discharge Diagnoses:  Active Problems:   Chest pain   Diabetes mellitus (HCC)   Non-small cell lung cancer (HCC)   Constipation   Hypothyroidism   Acute systolic heart failure (HCC)  Atypical Chest Pain, improved -Cardiology was consulted and appreciated evaluation recommendations -Troponin x 3 and was Flat; Troponin went from 0.04 -> 0.04 ->  0.05 -Continue to Monitor on Telemetry  -Recent Nuclear Stress Test showed no inducible Ischemia -CTA as below -Repeat ECHO essentially the same as 07/15/17 and there was no mention of any pericardial effusion -Per cardiology since there is no pericardial effusion left ventricular function was normal no further cardiac work-up is indicated at this time and they feel that the chest pain is most consistent with a musculoskeletal etiology such as costochondritis in the midsternal left sided chest wall or pleuritic component on the right side  -Repeat CXR showed Persistent scarring and consolidation right medial hemithorax at least in part due to radiation therapy change. There is adenopathy in this area as well. There is layering pleural effusion on the right. No new opacity evident on either side. Stable cardiac silhouette. Port-A-Cath tip in superior vena cava without pneumothorax. -Cardiology felt that her chest pain was most consistent with a musculoskeletal etiology such as costochondritis midsternal left-sided chest wall and may have had a pleuritic component to it -Recommended no further cardiac work-up at this time and patient is deemed medically stable to be discharged home -Follow-up with PCP as well as Cardiology in outpatient setting  Lower Left Quadrant Abdominal Pain, improved -Checked Bladder Scan -Obtain CT of the Abdomen/Pelvis without contrast as patient is recently had contrast -CT Scan showed Mild prominence of the right intrarenal collecting system, with right ureter being normal in caliber with no convincing stone. There are 2 stones that lie directly adjacent to the right ureter, but these appear to reside in the gonadal vein, and are stable from the prior CT. Findings support a recently passed  stone in the proper clinical setting. Small amount of fluid attenuation tracks along the right lateral abdominal wall musculature, nonspecific. Soft tissue density along the right lateral  abdominal wall soft tissues, abutting the underlying musculature, is stable from the prior CT. This may reflect a body wall metastatic lesion as described on prior CTs. Multiple gallstones. No evidence of acute cholecystitis. Small right pleural effusion, new since the prior study. Stable right lung changes consistent with scarring from prior radiation therapy. Right inferior external iliac and inguinal adenopathy consistent with metastatic disease. The larger conglomeration of inguinal lymph nodes has increased in size from the prior CT as detailed above. -*Unclear Etiology for LLQ Pain given CT Findings  -Checked Urinalysis and showed small Leukocytes, Negative Nitrites, Many Bacteria, and 11-20 WBC -Urine Culture showe Multiple Species Present  -Continue Pain control with Oxycodone-Acetaminophen 1 tab p.o. every 6 as needed moderate pain  Elevated D-Dimer -D-Dimer was 2.08 -CTA of the Chest was Negative for acute pulmonary embolus, pneumonia or other acute cardiopulmonary process. Stable to incremental enlargement of the likely metastatic right axillary lymph node which now measures 2.7 x 2.9 cm compared to 2.7 x 2.6 cm on 06/21/2017. No additional evidence of disease progression or other metastatic disease. Stable to incremental enlargement of low right paratracheal nodal mass. Stable post radiation changes with scarring/fibrosis in the medial aspects of the right upper and middle lobes confluent with the right hilum. Similar degree of mild bibasilar subsegmental atelectasis compared to prior studies. Stable left lower lobe pulmonary nodule -Follow up with Oncology at D/C  Diabetes Mellitus -Home insulin with Tyler Aas held currently as it is not on the hospital formulary but will resume at D/C -Placed on a Carb Modified Diet -Placed on insulin glargine 25 units subcu daily as well moderate NovoLog sliding scale insulin before meals and at bedtime while hospitalized  -Check Hemoglobin A1c as an  outpatient  -CBGs have been ranging from 123-178 -Follow up with PCP as an outpatient   Non-Small Cell Lung Cancer  -Followed by Dr. Irene Limbo as outpatient -Likely contributing to principle problem will have to r/o other causes first. -Need to follow-up with Dr. Irene Limbo as an outpatient  Constipation -Continue with Senna 8.6 mg p.o. daily as needed mild constipation  Hypothyroidism -Continue Levothyroxine 150 mcg p.o. daily   Chronic Systolic Heart Failure -Repeat echo shows EF of 55 to 60% consistent with the one prior study on 07/15/2017 and left ventricular diastolic function parameters were normal -BNP was 43.2 -Strict I's and O's, Daily Weights, SLIV -Continue with Metoprolol 25 minutes p.o. twice daily as well as losartan 25 mg p.o. Daily -Follow up with Cardiology as an outpatient   Hypomagnesemia, improved -Patient's Magnesium level  was 1.1 and improved to 2.2 -Replete with IV mag sulfate 4 g yesterday  -Continue to monitor and replete as necessary -Repeat mag level as an outpatient   GERD -Continue with Pantoprazole 40 mg p.o. Daily  Essential Hypertension -Continue with metoprolol 25 movement p.o. twice daily as well as losartan 25 mg p.o. Daily  History of cardiomyopathy is mild likely secondary to nonischemic cardiomyopathy -Etiology following an recent echo findings with an EF of 55 to 60% -Continue with ASA, metoprolol, losartan -Follow up with Cardiology as an outpatient   Discharge Instructions  Discharge Instructions    Call MD for:  difficulty breathing, headache or visual disturbances   Complete by:  As directed    Call MD for:  extreme fatigue   Complete by:  As directed    Call MD for:  hives   Complete by:  As directed    Call MD for:  persistant dizziness or light-headedness   Complete by:  As directed    Call MD for:  persistant nausea and vomiting   Complete by:  As directed    Call MD for:  redness, tenderness, or signs of infection  (pain, swelling, redness, odor or green/yellow discharge around incision site)   Complete by:  As directed    Call MD for:  severe uncontrolled pain   Complete by:  As directed    Call MD for:  temperature >100.4   Complete by:  As directed    Diet - low sodium heart healthy   Complete by:  As directed    Diet Carb Modified   Complete by:  As directed    Discharge instructions   Complete by:  As directed    Follow-up with PCP, Oncology, Cardiology as an outpatient.  Take all medications as prescribed.  If symptoms change or worsen please return to the emergency room for evaluation.   Increase activity slowly   Complete by:  As directed      Allergies as of 07/29/2017   No Known Allergies     Medication List    STOP taking these medications   carvedilol 6.25 MG tablet Commonly known as:  COREG     TAKE these medications   aspirin EC 81 MG tablet Take 1 tablet (81 mg total) by mouth daily.   CLARITIN 10 MG tablet Generic drug:  loratadine Take 10 mg by mouth daily.   clotrimazole 1 % cream Commonly known as:  CLOTRIMAZOLE ANTI-FUNGAL Apply 1 application topically 2 (two) times daily. What changed:  Another medication with the same name was removed. Continue taking this medication, and follow the directions you see here.   DULoxetine 60 MG capsule Commonly known as:  CYMBALTA Take 1 capsule (60 mg total) by mouth daily.   insulin aspart 100 UNIT/ML FlexPen Commonly known as:  NOVOLOG As per sliding scale instructions What changed:    how much to take  how to take this  when to take this  additional instructions   ketoconazole 2 % cream Commonly known as:  NIZORAL Apply to rash once daily. Keep area dry between applications.   levothyroxine 150 MCG tablet Commonly known as:  SYNTHROID, LEVOTHROID Take 1 tablet (150 mcg total) by mouth daily before breakfast.   lidocaine-prilocaine cream Commonly known as:  EMLA Apply 1 application topically as  needed. What changed:  reasons to take this   losartan 25 MG tablet Commonly known as:  COZAAR Take 25 mg by mouth daily. What changed:  Another medication with the same name was removed. Continue taking this medication, and follow the directions you see here.   methocarbamol 500 MG tablet Commonly known as:  ROBAXIN Take 1 tablet (500 mg total) by mouth every 8 (eight) hours as needed for muscle spasms.   metoprolol tartrate 25 MG tablet Commonly known as:  LOPRESSOR Take 1 tablet (25 mg total) by mouth 2 (two) times daily.   miconazole 2 % powder Commonly known as:  MICOTIN Apply topically 2 (two) times daily. Apply to area of rash over the perineum   naproxen 500 MG tablet Commonly known as:  NAPROSYN Take 1 tablet (500 mg total) by mouth 2 (two) times daily with a meal.   omeprazole 20 MG capsule Commonly known as:  PRILOSEC Take  1 capsule (20 mg total) by mouth daily.   oxyCODONE-acetaminophen 5-325 MG tablet Commonly known as:  PERCOCET Take 1 tablet by mouth every 6 (six) hours as needed. What changed:  reasons to take this   polyethylene glycol packet Commonly known as:  MIRALAX Take 17 g by mouth daily.   senna-docusate 8.6-50 MG tablet Commonly known as:  SENNA S Take 2 tablets by mouth 2 (two) times daily. May reduce to 2 tab po HS as needed once regular BM estabished What changed:    when to take this  reasons to take this  additional instructions   traZODone 50 MG tablet Commonly known as:  DESYREL Take 1-2 tablets (50-100 mg total) by mouth at bedtime as needed for sleep.   TRESIBA FLEXTOUCH 200 UNIT/ML Sopn Generic drug:  Insulin Degludec Inject 100 Units into the skin daily before breakfast.      Follow-up Information    Lendon Colonel, NP. Go on 08/15/2017.   Specialties:  Nurse Practitioner, Radiology, Cardiology Why:  9:30 am for post hospital follow up Contact information: [address] [place]  [address] [phone]        [name], FNP. Call.   Specialty:  Nurse Practitioner Why:  Follow up within 1-2 weeks  Contact information: [place] [address] [phone]        Pixie Casino, MD .   Specialty:  Cardiology Contact information: [place] [address] [phone]        [name], MD. Call.   Specialties:  Hematology, Oncology Why:  Follow up within 1-2 weeks  Contact information: [place] [address] [phone]          No Known Allergies  Consultations:  Cardiology Dr. Golden Hurter  Procedures/Studies: Ct Abdomen Pelvis Wo Contrast  Result Date: 07/28/2017 CLINICAL DATA:  chief complaint of chest pain. Patient has history of CHF, diabetes, lung cancer -with metastases to right flank. Patient states that she started having sudden onset chest pain Tuesday/ Pain initially was in the right flank but then also moved anteriorly. Pain is described as "sharp and grabbing pain". EXAM: CT ABDOMEN AND PELVIS WITHOUT CONTRAST TECHNIQUE: Multidetector CT imaging of the abdomen and pelvis was performed following the standard protocol without IV contrast. COMPARISON:  Chest, abdomen and pelvis CT, 05/11/2016. FINDINGS: Lower chest: Small right pleural effusion, new since the prior CT. There is opacity extending posteriorly and laterally from the inferior right hilum consistent with treatment related scarring, stable from the prior CT. Additional scarring and/or atelectasis is noted at the lung bases. Hepatobiliary: Liver is unremarkable. There are multiple gallstones. No evidence of acute cholecystitis. No bile duct dilation. Pancreas: Unremarkable. No pancreatic ductal dilatation or surrounding inflammatory changes. Spleen: Normal in size without focal abnormality. Adrenals/Urinary Tract: No adrenal masses. Mild prominence of the right intrarenal collecting  system. Subtle haziness is noted along the right renal contour, mostly inferiorly. No renal masses. No intrarenal stones. Normal left intrarenal collecting system. There are 2 calcifications adjacent to the right ureter which project within the gonadal vein. No convincing right ureteral stone. Both ureters are normal course and caliber. No bladder wall thickening, mass or stone. Stomach/Bowel: Stomach is within normal limits. Appendix appears normal. No evidence of bowel wall thickening, distention, or inflammatory changes. Vascular/Lymphatic: Right inguinal mass measuring 5 x 3.4 x 3.9 cm, previously 4.3 x 2.8 x 3.4 cm. This is  consistent with an enlarged node or conglomeration of nodes. Right external iliac node lies adjacent to this measuring 16 mm in short axis, unchanged. No other adenopathy. Minor aortic atherosclerotic calcifications.  No aneurysm. Reproductive: Uterus mildly enlarged by a poorly defined fibroid, stable from the prior study. No ovarian/adnexal masses. Other: No hernia or ascites. Fluid attenuation tracks along the right lateral abdominal wall musculature. There is an area of soft tissue attenuation in the deep subcutaneous fat of the right flank, stable from the prior CT. This measures 4.9 x 3.0 x 2.9 cm. Musculoskeletal: No fracture or acute finding. No osteoblastic or osteolytic lesions. IMPRESSION: 1. Mild prominence of the right intrarenal collecting system, with right ureter being normal in caliber with no convincing stone. There are 2 stones that lie directly adjacent to the right ureter, but these appear to reside in the gonadal vein, and are stable from the prior CT. Findings support a recently passed stone in the proper clinical setting. 2. Small amount of fluid attenuation tracks along the right lateral abdominal wall musculature, nonspecific. 3. Soft tissue density along the right lateral abdominal wall soft tissues, abutting the underlying musculature, is stable from the prior CT.  This may reflect a body wall metastatic lesion as described on prior CTs. 4. Multiple gallstones.  No evidence of acute cholecystitis. 5. Small right pleural effusion, new since the prior study. Stable right lung changes consistent with scarring from prior radiation therapy. 6. Right inferior external iliac and inguinal adenopathy consistent with metastatic disease. The larger conglomeration of inguinal lymph nodes has increased in size from the prior CT as detailed above. Electronically Signed   By: Lajean Manes M.D.   On: 07/28/2017 16:50   Dg Chest 2 View  Result Date: 07/27/2017 CLINICAL DATA:  Lung cancer. Increasing shortness of breath and fever. EXAM: CHEST - 2 VIEW COMPARISON:  Chest 06/21/2017.  CT chest 06/21/2017 FINDINGS: Shallow inspiration. Linear atelectasis in the lung bases. Right suprahilar/paratracheal fullness is unchanged since previous study. This corresponds to medial consolidation on previous CT, likely due to postradiation changes. No acute consolidation or airspace disease is appreciated. There is blunting of the costophrenic angles which may represent small pleural effusions versus pleural thickening. No pneumothorax. Heart size and pulmonary vascularity are normal. IMPRESSION: Chronic right hilar/right paratracheal changes similar to previous study. Shallow inspiration with atelectasis in the lung bases. No significant change. Electronically Signed   By: Lucienne Capers M.D.   On: 07/27/2017 01:32   Ct Angio Chest Pe W Or Wo Contrast  Result Date: 07/27/2017 CLINICAL DATA:  52 year old female with a history of lung cancer and new onset chest pain concerning for pulmonary embolus. EXAM: CT ANGIOGRAPHY CHEST WITH CONTRAST TECHNIQUE: Multidetector CT imaging of the chest was performed using the standard protocol during bolus administration of intravenous contrast. Multiplanar CT image reconstructions and MIPs were obtained to evaluate the vascular anatomy. CONTRAST:  78mL  ISOVUE-370 IOPAMIDOL (ISOVUE-370) INJECTION 76% COMPARISON:  Numerous prior CT scans of the chest, most recent prior CT PE study 06/21/2017. FINDINGS: Cardiovascular: Adequate opacification of the pulmonary arteries to the proximal subsegmental level. No evidence of central filling defect to suggest acute pulmonary embolus. Conventional 3 vessel arch anatomy. No evidence of aortic dissection or aneurysm. There is a right IJ approach port catheter. The catheter tip terminates in the right atrium. The heart is normal in size. No pericardial effusion. Mediastinum/Nodes: Stable low right paratracheal nodal mass measuring 2.2 x 2.6 cm (previously 2.0 x 2.5 cm). Slight  interval difference may be due to measurement technique rather than true interval growth. There is a large right axillary lymph node measuring 2.7 by 2.9 cm compared to 2.5 x 2.5 cm previously. Unremarkable thoracic esophagus. Lungs/Pleura: Significant atelectasis/scarring and cicatrization of the medial aspect of the right upper and middle lobes consistent with prior radiation changes. This is stable across multiple prior studies. Mild dependent atelectasis is present in both lower lobes, also similar compared to prior studies. Essentially unchanged 6 mm nodule in the left lower lobe dating back to at least 05/11/2017. No pleural effusion or pneumothorax. Upper Abdomen: No acute abnormality. Musculoskeletal: No chest wall abnormality. No acute or significant osseous findings. Review of the MIP images confirms the above findings. IMPRESSION: 1. Negative for acute pulmonary embolus, pneumonia or other acute cardiopulmonary process. 2. Stable to incremental enlargement of the likely metastatic right axillary lymph node which now measures 2.7 x 2.9 cm compared to 2.7 x 2.6 cm on 06/21/2017. No additional evidence of disease progression or other metastatic disease. 3. Stable to incremental enlargement of low right paratracheal nodal mass. 4. Stable post  radiation changes with scarring/fibrosis in the medial aspects of the right upper and middle lobes confluent with the right hilum. 5. Similar degree of mild bibasilar subsegmental atelectasis compared to prior studies. 6. Stable left lower lobe pulmonary nodule. Electronically Signed   By: Jacqulynn Cadet M.D.   On: 07/27/2017 09:08   Dg Chest Port 1 View  Result Date: 07/29/2017 CLINICAL DATA:  Shortness of breath EXAM: PORTABLE CHEST 1 VIEW COMPARISON:  Chest radiograph and chest CT July 27, 2017 FINDINGS: Port-A-Cath tip is in superior vena cava. No pneumothorax. There is consolidation along the lateral right hemithorax, at least in part due to radiation therapy change. There is probable layering pleural effusion on the right. There is no new opacity. The left lung is clear. Heart size and pulmonary vascularity are normal. There is adenopathy in the abdomen/right paratracheal region, unchanged from 1 day prior. No new lymph node prominence. No bone lesions. IMPRESSION: Persistent scarring and consolidation right medial hemithorax at least in part due to radiation therapy change. There is adenopathy in this area as well. There is layering pleural effusion on the right. No new opacity evident on either side. Stable cardiac silhouette. Port-A-Cath tip in superior vena cava without pneumothorax. Electronically Signed   By: Lowella Grip III M.D.   On: 07/29/2017 08:56   ECHOCARDIOGRAM ------------------------------------------------------------------- Study Conclusions  - Left ventricle: The cavity size was normal. Wall thickness was   increased in a pattern of mild LVH. Systolic function was normal.   The estimated ejection fraction was in the range of 55% to 60%.   Wall motion was normal; there were no regional wall motion   abnormalities. Left ventricular diastolic function parameters   were normal. - Aortic valve: Sclerosis without stenosis. There was mild   regurgitation. Valve area  (Vmax): 0.78 cm^2. - Mitral valve: Mildly thickened leaflets . There was mild   regurgitation. - Left atrium: The atrium was normal in size. - Tricuspid valve: There was trivial regurgitation. - Pulmonary arteries: PA peak pressure: 25 mm Hg (S). - Inferior vena cava: The vessel was normal in size. The   respirophasic diameter changes were in the normal range (>= 50%),   consistent with normal central venous pressure.  Impressions:  - Compared to a prior study on 07/15/17, there has been no change.  Subjective: Seen examined at bedside and had improved.  No  chest pain, nausea, vomiting, shortness breath, abdominal pain.  Felt well and was ready to go home.  Discharge Exam: Vitals:   07/29/17 0536 07/29/17 0539  BP:  110/69  Pulse:  96  Resp:  14  Temp: 98.1 F (36.7 C)   SpO2:  98%   Vitals:   07/28/17 2204 07/28/17 2252 07/29/17 0536 07/29/17 0539  BP: (!) 110/59 (!) 101/53  110/69  Pulse: 96   96  Resp: 20   14  Temp: 98.5 F (36.9 C)  98.1 F (36.7 C)   TempSrc: Oral  Oral   SpO2: 97%   98%  Weight:      Height:       General: Pt is alert, awake, not in acute distress Cardiovascular: RRR, S1/S2 +, no rubs, no gallops Respiratory: Diminished bilaterally, no wheezing, no rhonchi Abdominal: Soft, NT, Distended due to body habitus, bowel sounds + Extremities: Trace edema, no cyanosis  The results of significant diagnostics from this hospitalization (including imaging, microbiology, ancillary and laboratory) are listed below for reference.    Microbiology: Recent Results (from the past 240 hour(s))  Culture, Urine     Status: Abnormal   Collection Time: 07/28/17  5:41 PM  Result Value Ref Range Status   Specimen Description   Final    URINE, RANDOM Performed at [facility] [name], [place], [place] [address]    Special Requests   Final    NONE Performed at [facility], [place] [address] [place], [place] [address]     Culture MULTIPLE SPECIES PRESENT, SUGGEST RECOLLECTION (A)  Final   Report Status 07/29/2017 FINAL  Final    Labs: BNP (last 3 results) Recent Labs    07/27/17 0158  BNP 95.0   Basic Metabolic Panel: Recent Labs  Lab 07/25/17 0951 07/27/17 0207 07/28/17 0859 07/29/17 0421  NA 140 141 137 142  K 3.9 3.5 4.0 3.6  CL 106 105 105 109  CO2 25  --  23 25  GLUCOSE 225* 247* 256* 114*  BUN 8 5* 7 5*  CREATININE 0.82 0.60 0.70  0.74 0.56  CALCIUM 8.8  --  8.1* 7.8*  MG  --   --  1.1* 2.2  PHOS  --   --  3.6 3.8   Liver Function Tests: Recent Labs  Lab 07/25/17 0951 07/28/17 0859 07/29/17 0421  AST 14 12* 14*  ALT 12 11* 11*  ALKPHOS 89 72 67  BILITOT <0.2* 0.2* 0.6  PROT 7.5 6.2* 6.1*  ALBUMIN 3.1* 2.6* 2.5*   Recent Labs  Lab 07/28/17 0859  LIPASE 20   No results for input(s): AMMONIA in the last 168 hours. CBC: Recent Labs  Lab 07/25/17 0951 07/27/17 0158 07/27/17 0207 07/28/17 0859 07/29/17 0421  WBC 6.1 7.2  --  5.7 5.1  NEUTROABS 4.4 5.2  --  3.9 3.4  HGB 10.0* 9.4* 8.8* 8.9* 8.5*  HCT 31.4* 29.2* 26.0* 27.9* 26.3*  MCV 90.8 89.6  --  88.9 88.9  PLT 247 288  --  244 245   Cardiac Enzymes: Recent Labs  Lab 07/27/17 0158 07/27/17 1447 07/27/17 2339  TROPONINI 0.06* 0.04* 0.05*   BNP: Invalid input(s): POCBNP CBG: Recent Labs  Lab 07/28/17 1143 07/28/17 1654 07/28/17 2158 07/29/17 0748 07/29/17 1131  GLUCAP 181* 129* 126* 123* 178*   D-Dimer Recent Labs    07/27/17 0158  DDIMER 2.08*   Hgb A1c Recent Labs    07/29/17 0421  HGBA1C 8.9*   Lipid Profile No results for input(s): CHOL, HDL, LDLCALC, TRIG, CHOLHDL, LDLDIRECT in the last 72 hours. Thyroid function studies No results for input(s): TSH, T4TOTAL, T3FREE, THYROIDAB in the last 72 hours.  Invalid input(s): FREET3 Anemia work up No results for input(s): VITAMINB12, FOLATE, FERRITIN, TIBC, IRON, RETICCTPCT in the last 72 hours. Urinalysis    Component Value  Date/Time   COLORURINE STRAW (A) 07/28/2017 1741   APPEARANCEUR CLEAR 07/28/2017 1741   LABSPEC 1.005 07/28/2017 1741   PHURINE 6.0 07/28/2017 1741   GLUCOSEU NEGATIVE 07/28/2017 1741   HGBUR NEGATIVE 07/28/2017 1741   BILIRUBINUR NEGATIVE 07/28/2017 1741   KETONESUR NEGATIVE 07/28/2017 1741   PROTEINUR NEGATIVE 07/28/2017 1741   UROBILINOGEN 0.2 02/01/2013 2022   NITRITE NEGATIVE 07/28/2017 1741   LEUKOCYTESUR SMALL (A) 07/28/2017 1741   Sepsis Labs Invalid input(s): PROCALCITONIN,  WBC,  LACTICIDVEN Microbiology Recent Results (from the past 240 hour(s))  Culture, Urine     Status: Abnormal   Collection Time: 07/28/17  5:41 PM  Result Value Ref Range Status   Specimen Description   Final    URINE, RANDOM Performed at [facility], [place] [address] [place], [place] [address]    Special Requests   Final    NONE Performed at [facility], [place] [address], [place], [place] [address]    Culture MULTIPLE SPECIES PRESENT, SUGGEST RECOLLECTION (A)  Final   Report Status 07/29/2017 FINAL  Final   Time coordinating discharge: 35 minutes  SIGNED:  Kerney Elbe, DO [facility] 07/29/2017, 1:39 PM Pager [phone]  If 7PM-7AM, please contact night-coverage [email or web address] Password [email or web address]

## 2017-08-03 ENCOUNTER — Encounter (HOSPITAL_COMMUNITY): Payer: Self-pay | Admitting: Radiology

## 2017-08-03 ENCOUNTER — Ambulatory Visit (HOSPITAL_COMMUNITY)
Admission: RE | Admit: 2017-08-03 | Discharge: 2017-08-03 | Disposition: A | Discharge status: Home / Self Care | Payer: Medicaid Other | Source: Ambulatory Visit | Attending: Hematology | Admitting: Hematology

## 2017-08-03 DIAGNOSIS — C3491 Malignant neoplasm of unspecified part of right bronchus or lung: Secondary | ICD-10-CM | POA: Diagnosis present

## 2017-08-03 LAB — GLUCOSE, CAPILLARY: Glucose-Capillary: 147 mg/dL — ABNORMAL HIGH (ref 65–99)

## 2017-08-03 MED ORDER — FLUDEOXYGLUCOSE F - 18 (FDG) INJECTION
12.7000 | Freq: Once | INTRAVENOUS | Status: AC | PRN
  Administered 2017-08-03: 12.7 via INTRAVENOUS

## 2017-08-05 ENCOUNTER — Other Ambulatory Visit: Payer: Self-pay

## 2017-08-05 DIAGNOSIS — C3491 Malignant neoplasm of unspecified part of right bronchus or lung: Secondary | ICD-10-CM

## 2017-08-05 MED ORDER — METOPROLOL TARTRATE 25 MG PO TABS: 25.0000 mg | ORAL_TABLET | Freq: Two times a day (BID) | ORAL | 0 refills | Status: DC

## 2017-08-05 NOTE — Progress Notes (Signed)
[name]    HEMATOLOGY/ONCOLOGY CLINIC NOTE  Date of Service: 08/08/17  Patient Care Team: [name], FNP as PCP - General (Nurse Practitioner) Pixie Casino, MD as PCP - Cardiology (Cardiology)  CHIEF COMPLAINTS  F/u for continued management of metastatic lung adenocarcinoma  Diagnosis Metastatic Lung Adenocarcinoma with rt flank subcutaneous metastasis.  Current treatment:  Nivolumab q2 weeks  PreviousTreatment -Concurrent Chemo-radiation with Carboplatin/Taxol. Completed RT on 02/13/2016 and has then completed 2 cycles of carboplatin + taxol. -Started Maintenance Durvalumab from  05/24/2016 q2weeks, switched to Nivolumab q2weeks on 12/13/16 once metastatic disease noted -s/p palliative RT to rt sided Subcutaneous metastatic mass.    HISTORY OF PRESENTING ILLNESS: Plz see my previous note for details on initial presentation.  INTERVAL HISTORY   Marilyn [name] presents to the office today for followup of her metastatic lung cancer. The patient's last visit with [place] was on 07/11/17. The pt reports that she is doing well overall.   The pt reports that she presented to the ED on 07/26/17 for chest wall pain and SOB. She also describes intermittent sharp shooting pains in both of her breasts. She also notes that her lower abdomen has been painful. She notes that she had a fever at the time of her presentation. She also notes that her SOB is resolved today. She denies neck pain but does endorse muscle stiffness in her neck that has decreased in frequency.   The pt notes that she is able to palpate her right inguinal LN swelling and that it is occasionally sore. She denies wanting to receive radiation at this time, but will let us know if this worsens.   The pt notes that her inguinal rash dried up while taking PO antibiotics.   Of note since the patient's last visit, pt has had PET completed on 08/03/17 with results revealing 1. 2.8 cm right axillary lymph node is markedly  hypermetabolic and consistent with metastatic disease. 2. Extensive radiation changes involving the right paramediastinal lung. There is a focus of hypermetabolism in the right lower lobe which is suspicious for residual or recurrent tumor. There is also hypermetabolic mediastinal adenopathy. 3. 5 cm soft tissue mass in the upper right inguinal area is hypermetabolic and consistent with metastatic adenopathy.  Lab results today (08/08/17) of CBC, CMP, and Reticulocytes is as follows: all values are WNL except for RBC at 3.46, Hgb at 9.9, HCT at 31.2, Glucose at 240, Albumin at 3.2, Total Bilirubin at <0.2. TSH 08/08/17 is elevated at 3.992  On review of systems, pt reports chest wall pain, intermittent shooting breast pains, resolving SOB, lessening peripheral neuropathy, and denies noticing any new lumps or bumps, neck pain, and any other symptoms.    MEDICAL HISTORY:  Past Medical History:  Diagnosis Date  . Arthritis   . Asthma   . Diabetes mellitus    Type II  . Hemorrhoids   . Hypercholesteremia   . Hypertension   . Hypothyroidism   . Metastatic lung cancer (metastasis from lung to other site) (HCC)    Lung, Mets to skin- right flank. CT angio 2019 suspicious for axillary, external iliac, right inguinal mets  . 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 NICM (nonischemic cardiomyopathy) ([place])    a. mild - EF 45-50% by echo 10/2016 but EF 52% by low risk nuc 11/2016. b. Echo 07/2017 - EF normal, mild AI, unremarkable echo otherwise.  . Obese   . Pneumonia 2017  .  Sinus tachycardia    a. dates back to at least 2013, etiology unclear.    SURGICAL HISTORY: Past Surgical History:  Procedure Laterality Date  . CESAREAN SECTION    . COLONOSCOPY    . I/D  arm Right 2013  . IR FLUORO GUIDE PORT INSERTION RIGHT  02/18/2017  . IR GENERIC HISTORICAL  01/08/2016   IR US GUIDE VASC ACCESS RIGHT 01/08/2016 [name], DO [facility]  . IR GENERIC HISTORICAL  01/08/2016   IR  FLUORO GUIDE CV LINE RIGHT 01/08/2016 [name], DO [facility]  . IR US GUIDE VASC ACCESS RIGHT  02/18/2017  . MULTIPLE EXTRACTIONS WITH ALVEOLOPLASTY N/A 03/25/2017   Procedure: MULTIPLE EXTRACTION WITH ALVEOLOPLASTY (teeth #s four, six, seven, eight, nine, ten, eleven, one, twenty-three, twenty-four, twenty-five, twenty-six, twenty-nine, thirty);  Surgeon: [name], [name];  Location: [facility];  Service: Oral Surgery;  Laterality: N/A;  . TUBAL LIGATION    . US guided core needle biopsy     of right lower neck/supraclavicular lymph nodes    SOCIAL HISTORY: Social History   Socioeconomic History  . Marital status: Divorced    Spouse name: Not on file  . Number of children: Not on file  . Years of education: Not on file  . Highest education level: Not on file  Occupational History  . Not on file  Social Needs  . Financial resource strain: Not on file  . Food insecurity:    Worry: Not on file    Inability: Not on file  . Transportation needs:    Medical: Not on file    Non-medical: Not on file  Tobacco Use  . Smoking status: Never Smoker  . Smokeless tobacco: Never Used  Substance and Sexual Activity  . Alcohol use: No  . Drug use: No  . Sexual activity: Not Currently  Lifestyle  . Physical activity:    Days per week: Not on file    Minutes per session: Not on file  . Stress: Not on file  Relationships  . Social connections:    Talks on phone: Not on file    Gets together: Not on file    Attends religious service: Not on file    Active member of club or organization: Not on file    Attends meetings of clubs or organizations: Not on file    Relationship status: Not on file  . Intimate partner violence:    Fear of current or ex partner: Not on file    Emotionally abused: Not on file    Physically abused: Not on file    Forced sexual activity: Not on file  Other Topics Concern  . Not on file  Social History Narrative   No bird or mold exposure. No recent  travel.    FAMILY HISTORY: Family History  Problem Relation Age of Onset  . Heart failure Mother   . Hypertension Mother   . Cancer Neg Hx   . Rheumatologic disease Neg Hx     ALLERGIES:  has No Known Allergies.  MEDICATIONS:  Current Outpatient Medications  Medication Sig Dispense Refill  . aspirin EC 81 MG tablet Take 1 tablet (81 mg total) by mouth daily. 60 tablet 2  . clotrimazole (CLOTRIMAZOLE ANTI-FUNGAL) 1 % cream Apply 1 application topically 2 (two) times daily. 30 g 0  . DULoxetine (CYMBALTA) 60 MG capsule Take 1 capsule (60 mg total) by mouth daily. 30 capsule 2  . insulin aspart (NOVOLOG) 100 UNIT/ML FlexPen As per  sliding scale instructions (Patient taking differently: Inject 0-10 Units into the skin 3 (three) times daily. As per sliding scale instructions) 15 mL 2  . Insulin Degludec (TRESIBA FLEXTOUCH) 200 UNIT/ML SOPN Inject 100 Units into the skin daily before breakfast.     . ketoconazole (NIZORAL) 2 % cream Apply to rash once daily. Keep area dry between applications. 60 g 0  . levothyroxine (SYNTHROID, LEVOTHROID) 150 MCG tablet Take 1 tablet (150 mcg total) by mouth daily before breakfast. 30 tablet 2  . lidocaine-prilocaine (EMLA) cream Apply 1 application topically as needed. 30 g 6  . loratadine (CLARITIN) 10 MG tablet Take 10 mg by mouth daily.    [name] losartan (COZAAR) 25 MG tablet Take 25 mg by mouth daily.    . methocarbamol (ROBAXIN) 500 MG tablet Take 1 tablet (500 mg total) by mouth every 8 (eight) hours as needed for muscle spasms. 8 tablet 0  . metoprolol tartrate (LOPRESSOR) 25 MG tablet Take 1 tablet (25 mg total) by mouth 2 (two) times daily. 60 tablet 0  . miconazole (MICOTIN) 2 % powder Apply topically 2 (two) times daily. Apply to area of rash over the perineum 85 g 1  . naproxen (NAPROSYN) 500 MG tablet Take 1 tablet (500 mg total) by mouth 2 (two) times daily with a meal. 20 tablet 0  . omeprazole (PRILOSEC) 20 MG capsule Take 1 capsule (20 mg  total) by mouth daily. 30 capsule 1  . oxyCODONE-acetaminophen (PERCOCET) 5-325 MG tablet Take 1 tablet by mouth every 6 (six) hours as needed. (Patient taking differently: Take 1 tablet by mouth every 6 (six) hours as needed for moderate pain or severe pain. ) 30 tablet 0  . polyethylene glycol (MIRALAX) packet Take 17 g by mouth daily. 30 each 1  . senna-docusate (SENNA S) 8.6-50 MG tablet Take 2 tablets by mouth 2 (two) times daily. May reduce to 2 tab po HS as needed once regular BM estabished (Patient taking differently: Take 2 tablets by mouth 2 (two) times daily as needed (FOR CONSTIPATION). =) 60 tablet 1  . traZODone (DESYREL) 50 MG tablet Take 1-2 tablets (50-100 mg total) by mouth at bedtime as needed for sleep. 60 tablet 0   No current facility-administered medications for this visit.    Facility-Administered Medications Ordered in Other Visits  Medication Dose Route Frequency Provider Last Rate Last Dose  . sodium chloride 0.9 % injection 10 mL  10 mL Intravenous PRN [name], MD   10 mL at 05/24/16 1252    REVIEW OF SYSTEMS:   A 10+ POINT REVIEW OF SYSTEMS WAS OBTAINED including neurology, dermatology, psychiatry, cardiac, respiratory, lymph, extremities, GI, GU, Musculoskeletal, constitutional, breasts, reproductive, HEENT.  All pertinent positives are noted in the HPI.  All others are negative.   PHYSICAL EXAMINATION: ECOG PERFORMANCE STATUS: 1 - Symptomatic but completely ambulatory  . Vitals:   08/08/17 1003  BP: 125/79  Pulse: (!) 107  Resp: 20  Temp: 98.3 F (36.8 C)  SpO2: 98%   Filed Weights   08/08/17 1003  Weight: 275 lb 6.4 oz (124.9 kg)   .Body mass index is 38.41 kg/m. [name] GENERAL:alert, in no acute distress and comfortable SKIN: no acute rashes, no significant lesions EYES: conjunctiva are pink and non-injected, sclera anicteric OROPHARYNX: MMM, no exudates, no oropharyngeal erythema or ulceration NECK: supple, no JVD LYMPH:  no palpable  lymphadenopathy in the cervical, axillary or inguinal regions LUNGS: clear to auscultation b/l with normal respiratory effort  HEART: regular rate & rhythm ABDOMEN:  normoactive bowel sounds , non tender, not distended. Extremity: no pedal edema PSYCH: alert & oriented x 3 with fluent speech NEURO: no focal motor/sensory deficits   LABORATORY DATA: .  [name] CBC Latest Ref Rng & Units 08/08/2017 07/29/2017 07/28/2017  WBC 3.9 - 10.3 K/uL 5.3 5.1 5.7  Hemoglobin 11.6 - 15.9 g/dL 9.9(L) 8.5(L) 8.9(L)  Hematocrit 34.8 - 46.6 % 31.2(L) 26.3(L) 27.9(L)  Platelets 145 - 400 K/uL 280 245 244  HGB 9.5  . CMP Latest Ref Rng & Units 08/08/2017 07/29/2017 07/28/2017  Glucose 70 - 140 mg/dL 240(H) 114(H) 256(H)  BUN 7 - 26 mg/dL 9 5(L) 7  Creatinine 0.60 - 1.10 mg/dL 0.81 0.56 0.70  Sodium 136 - 145 mmol/L 138 142 137  Potassium 3.5 - 5.1 mmol/L 3.9 3.6 4.0  Chloride 98 - 109 mmol/L 104 109 105  CO2 22 - 29 mmol/L _0 Calcium 8.4 - 10.4 mg/dL 9.3 7.8(L) 8.1(L)  Total Protein 6.4 - 8.3 g/dL 7.5 6.1(L) 6.2(L)  Total Bilirubin 0.2 - 1.2 mg/dL <0.2(L) 0.6 0.2(L)  Alkaline Phos 40 - 150 U/L 88 67 72  AST 5 - 34 U/L 12 14(L) 12(L)  ALT 0 - 55 U/L 8 11(L) 11(L)   Component     Latest Ref Rng & Units 08/08/2017  TSH     0.308 - 3.960 uIU/mL 3.992 (H)               RADIOGRAPHIC STUDIES:  .Ct Abdomen Pelvis Wo Contrast  Result Date: 07/28/2017 CLINICAL DATA:  chief complaint of chest pain. Patient has history of CHF, diabetes, lung cancer -with metastases to right flank. Patient states that she started having sudden onset chest pain Tuesday/ Pain initially was in the right flank but then also moved anteriorly. Pain is described as "sharp and grabbing pain". EXAM: CT ABDOMEN AND PELVIS WITHOUT CONTRAST TECHNIQUE: Multidetector CT imaging of the abdomen and pelvis was performed following the standard protocol without IV contrast. COMPARISON:  Chest, abdomen and pelvis CT, 05/11/2016. FINDINGS:  Lower chest: Small right pleural effusion, new since the prior CT. There is opacity extending posteriorly and laterally from the inferior right hilum consistent with treatment related scarring, stable from the prior CT. Additional scarring and/or atelectasis is noted at the lung bases. Hepatobiliary: Liver is unremarkable. There are multiple gallstones. No evidence of acute cholecystitis. No bile duct dilation. Pancreas: Unremarkable. No pancreatic ductal dilatation or surrounding inflammatory changes. Spleen: Normal in size without focal abnormality. Adrenals/Urinary Tract: No adrenal masses. Mild prominence of the right intrarenal collecting system. Subtle haziness is noted along the right renal contour, mostly inferiorly. No renal masses. No intrarenal stones. Normal left intrarenal collecting system. There are 2 calcifications adjacent to the right ureter which project within the gonadal vein. No convincing right ureteral stone. Both ureters are normal course and caliber. No bladder wall thickening, mass or stone. Stomach/Bowel: Stomach is within normal limits. Appendix appears normal. No evidence of bowel wall thickening, distention, or inflammatory changes. Vascular/Lymphatic: Right inguinal mass measuring 5 x 3.4 x 3.9 cm, previously 4.3 x 2.8 x 3.4 cm. This is consistent with an enlarged node or conglomeration of nodes. Right external iliac node lies adjacent to this measuring 16 mm in short axis, unchanged. No other adenopathy. Minor aortic atherosclerotic calcifications.  No aneurysm. Reproductive: Uterus mildly enlarged by a poorly defined fibroid, stable from the prior study. No ovarian/adnexal masses. Other: No hernia or ascites. Fluid attenuation  tracks along the right lateral abdominal wall musculature. There is an area of soft tissue attenuation in the deep subcutaneous fat of the right flank, stable from the prior CT. This measures 4.9 x 3.0 x 2.9 cm. Musculoskeletal: No fracture or acute finding.  No osteoblastic or osteolytic lesions. IMPRESSION: 1. Mild prominence of the right intrarenal collecting system, with right ureter being normal in caliber with no convincing stone. There are 2 stones that lie directly adjacent to the right ureter, but these appear to reside in the gonadal vein, and are stable from the prior CT. Findings support a recently passed stone in the proper clinical setting. 2. Small amount of fluid attenuation tracks along the right lateral abdominal wall musculature, nonspecific. 3. Soft tissue density along the right lateral abdominal wall soft tissues, abutting the underlying musculature, is stable from the prior CT. This may reflect a body wall metastatic lesion as described on prior CTs. 4. Multiple gallstones.  No evidence of acute cholecystitis. 5. Small right pleural effusion, new since the prior study. Stable right lung changes consistent with scarring from prior radiation therapy. 6. Right inferior external iliac and inguinal adenopathy consistent with metastatic disease. The larger conglomeration of inguinal lymph nodes has increased in size from the prior CT as detailed above. Electronically Signed   By: Lajean Manes M.D.   On: 07/28/2017 16:50   Dg Chest 2 View  Result Date: 07/27/2017 CLINICAL DATA:  Lung cancer. Increasing shortness of breath and fever. EXAM: CHEST - 2 VIEW COMPARISON:  Chest 06/21/2017.  CT chest 06/21/2017 FINDINGS: Shallow inspiration. Linear atelectasis in the lung bases. Right suprahilar/paratracheal fullness is unchanged since previous study. This corresponds to medial consolidation on previous CT, likely due to postradiation changes. No acute consolidation or airspace disease is appreciated. There is blunting of the costophrenic angles which may represent small pleural effusions versus pleural thickening. No pneumothorax. Heart size and pulmonary vascularity are normal. IMPRESSION: Chronic right hilar/right paratracheal changes similar to previous  study. Shallow inspiration with atelectasis in the lung bases. No significant change. Electronically Signed   By: Lucienne Capers M.D.   On: 07/27/2017 01:32   Ct Angio Chest Pe W Or Wo Contrast  Result Date: 07/27/2017 CLINICAL DATA:  52 year old female with a history of lung cancer and new onset chest pain concerning for pulmonary embolus. EXAM: CT ANGIOGRAPHY CHEST WITH CONTRAST TECHNIQUE: Multidetector CT imaging of the chest was performed using the standard protocol during bolus administration of intravenous contrast. Multiplanar CT image reconstructions and MIPs were obtained to evaluate the vascular anatomy. CONTRAST:  51m ISOVUE-370 IOPAMIDOL (ISOVUE-370) INJECTION 76% COMPARISON:  Numerous prior CT scans of the chest, most recent prior CT PE study 06/21/2017. FINDINGS: Cardiovascular: Adequate opacification of the pulmonary arteries to the proximal subsegmental level. No evidence of central filling defect to suggest acute pulmonary embolus. Conventional 3 vessel arch anatomy. No evidence of aortic dissection or aneurysm. There is a right IJ approach port catheter. The catheter tip terminates in the right atrium. The heart is normal in size. No pericardial effusion. Mediastinum/Nodes: Stable low right paratracheal nodal mass measuring 2.2 x 2.6 cm (previously 2.0 x 2.5 cm). Slight interval difference may be due to measurement technique rather than true interval growth. There is a large right axillary lymph node measuring 2.7 by 2.9 cm compared to 2.5 x 2.5 cm previously. Unremarkable thoracic esophagus. Lungs/Pleura: Significant atelectasis/scarring and cicatrization of the medial aspect of the right upper and middle lobes consistent with prior radiation changes. This  is stable across multiple prior studies. Mild dependent atelectasis is present in both lower lobes, also similar compared to prior studies. Essentially unchanged 6 mm nodule in the left lower lobe dating back to at least 05/11/2017. No  pleural effusion or pneumothorax. Upper Abdomen: No acute abnormality. Musculoskeletal: No chest wall abnormality. No acute or significant osseous findings. Review of the MIP images confirms the above findings. IMPRESSION: 1. Negative for acute pulmonary embolus, pneumonia or other acute cardiopulmonary process. 2. Stable to incremental enlargement of the likely metastatic right axillary lymph node which now measures 2.7 x 2.9 cm compared to 2.7 x 2.6 cm on 06/21/2017. No additional evidence of disease progression or other metastatic disease. 3. Stable to incremental enlargement of low right paratracheal nodal mass. 4. Stable post radiation changes with scarring/fibrosis in the medial aspects of the right upper and middle lobes confluent with the right hilum. 5. Similar degree of mild bibasilar subsegmental atelectasis compared to prior studies. 6. Stable left lower lobe pulmonary nodule. Electronically Signed   By: Jacqulynn Cadet M.D.   On: 07/27/2017 09:08   Nm Pet Image Restag (ps) Skull Base To Thigh  Result Date: 08/03/2017 CLINICAL DATA:  Subsequent treatment strategy for non-small cell lung cancer. EXAM: NUCLEAR MEDICINE PET SKULL BASE TO THIGH TECHNIQUE: 12.7 mCi F-18 FDG was injected intravenously. Full-ring PET imaging was performed from the skull base to thigh after the radiotracer. CT data was obtained and used for attenuation correction and anatomic localization. Fasting blood glucose: 147 mg/dl COMPARISON:  Multiple prior chest CTs. The most recent is 07/27/2017 FINDINGS: Mediastinal blood pool activity: SUV max 3.53 NECK: No hypermetabolic lymph nodes in the neck. Incidental CT findings: none CHEST: 2.8 cm rounded axillary lymph node is hypermetabolic with SUV max of 01.0. This is consistent with metastatic adenopathy. Small, subcentimeter adjacent lymph nodes are not hypermetabolic. Stable dense radiation changes involving the right paramediastinal lu mild diffuse FDG uptake with a small  rounded focus of more significant uptake noted medially and posteriorly. SUV max is 5.87. Findings likely represent residual or recurrent focus of tumor. Basilar scarring changes. No other pulmonary nodules. No pleural effusion. 14.5 mm pretracheal lymph node is hypermetabolic with SUV max of 2.72. Incidental CT findings: none ABDOMEN/PELVIS: No abnormal hypermetabolic activity within the liver, pancreas, adrenal glands, or spleen. No hypermetabolic lymph nodes in the abdomen or pelvis. 5 cm soft tissue mass in the right upper groin area is hypermetabolic with SUV max 53.6. Right common femoral node measuring 13 mm on image number 187 is mildly hypermetabolic with SUV max of 6.44 Incidental CT findings: none SKELETON: No focal hypermetabolic activity to suggest skeletal metastasis. Incidental CT findings: none IMPRESSION: 1. 2.8 cm right axillary lymph node is markedly hypermetabolic and consistent with metastatic disease. 2. Extensive radiation changes involving the right paramediastinal lung. There is a focus of hypermetabolism in the right lower lobe which is suspicious for residual or recurrent tumor. There is also hypermetabolic mediastinal adenopathy. 3. 5 cm soft tissue mass in the upper right inguinal area is hypermetabolic and consistent with metastatic adenopathy. Electronically Signed   By: Marijo Sanes M.D.   On: 08/03/2017 13:55   Dg Chest Port 1 View  Result Date: 07/29/2017 CLINICAL DATA:  Shortness of breath EXAM: PORTABLE CHEST 1 VIEW COMPARISON:  Chest radiograph and chest CT July 27, 2017 FINDINGS: Port-A-Cath tip is in superior vena cava. No pneumothorax. There is consolidation along the lateral right hemithorax, at least in part due to radiation therapy  change. There is probable layering pleural effusion on the right. There is no new opacity. The left lung is clear. Heart size and pulmonary vascularity are normal. There is adenopathy in the abdomen/right paratracheal region, unchanged from  1 day prior. No new lymph node prominence. No bone lesions. IMPRESSION: Persistent scarring and consolidation right medial hemithorax at least in part due to radiation therapy change. There is adenopathy in this area as well. There is layering pleural effusion on the right. No new opacity evident on either side. Stable cardiac silhouette. Port-A-Cath tip in superior vena cava without pneumothorax. Electronically Signed   By: Lowella Grip III M.D.   On: 07/29/2017 08:56     ASSESSMENT & PLAN:   52 y.o.  African-American female with history of hypertension, diabetes, asthma, dysuria mass and morbid obesity with    #1 h/o Lung Adenocarcinoma Rt sided atleast Stage IIIB (on diagnosis) with large right paratracheal mass with mediastinal adenopathy that appears to have grown significantly over the last 6-7 months and rt supraclavicular LN +.  -Noted to have a small mass in the left adrenal on CT but PET/CT neg for metastatic disease. Patient has been a lifelong nonsmoker -MRI of the brain was negative for any metastatic disease. At diagnosis. -High PDL1 expression (90%) on [facility] Neg for EGFR, ALK, ROS-1 and BRAF mutations. -Patient completed her planned definitive chemoradiation with carbo Taxol on 02/13/2016. No prohibitive toxicities other than some grade 1 skin desquamation and some grade 1-2 radiation esophagitis. She has subsequently received 2 out of 2 planned dose of carboplatin + Taxol. -Patient has completed 11 doses of maintenance   -CTA chest 10/29/2016  no evidence of lung cancer progression in the chest. No PE -CT abd/pelvis-10/29/2016  Interval 5.0 x 5.0 x 4.7 cm mass in the right lateral subcutaneous fat at the level of the mid abdomen. This has CT features compatible with a metastasis or primary neoplasm. -Continue IV Nivolumab q2weeks - plz schedule next 4 doses Labs q2weeks RTC with Dr Irene Limbo in 4 weeks. CT C/A/P (05/12/2017): Interval development of right axillary, right  external iliac and right inguinal adenopathy. Suspicious for nodal metastasis. 2. Decrease in size of right paratracheal adenopathy. 3. Decrease in size and enhancement associated with right lateral body wall lesion. 4. New small nonspecific pulmonary nodule in the left lower lobe measuring 6 mm. 5. Stable appearance of changes secondary to external beam radiation within the right lung    #2 Now with Metastatic lung adenocarcinoma with isolated metastases in the right lateral subcutaneous fat at the level of the midabdomen. she received maintenance Durvalumab from  05/24/2016 q2weeks, switched to Nivolumab q2weeks on 12/13/16 in the setting of Biopsy proven isolated metastatic disease. Given the tumor is strongly PDL1 positive and patient has only isolated metastatic disease was treated with palliative RT to metastases with significant improvement.  Plan -Discussed pt labwork today, 08/08/17; anemia improved from 8.5 to 9.9, blood counts and chemistries are otherwise stable.  -Discussed 08/03/17 PET which revealed 2.8 cm right axillary lymph node is markedly hypermetabolic and consistent with metastatic disease. Extensive radiation changes involving the right paramediastinal lung. There is a focus of hypermetabolism in the right lower lobe which is suspicious for residual or recurrent tumor. -Discussed treatment options as continuing immunotherapy with radiation or beginning chemotherapy. -Will refer to radiation if LN become more bothersome.   -Will repeat bx to determine if any new mutations have occurred. -Offered a referral to [facility] for a second opinion,  pt will wait on this for now. -Clendomycin gel for inguinal rash.  -Will refill Cymbalta, Percocet, Synthroid, Prilsoec today.  -Will repeat Mutation studies  -The pt has no prohibitive toxicities from continuing Nivolumab at this time.    #3 h/o radiation pneumonitis - no new symptoms. Does has some cough with exertion but this has  improved.  #4 Improving fatigue (grade 1) - improved fatigue with levothyroxine compliance with near normalization of FT4 levels.  - previously given TSH of 12 -- increased Levolthyroxine from 144mg to 152m  ---will likely need larger dose but in the setting of baseline tachycardia we are gradually increasing dose to avoid ppt cardiac arrhythmias.  -TSH 08/08/17 was at 3.992  #5 normocytic anemia related to chemotherapy -resolving and stable Hg at 9.9  #6 Left chest wall pain due to costochondritis - mx with pain medications. CTA x 3 neg for PE. No significant pain at this time.  #7 Sinus tachycardia - on BB per cards , no PE on CTA x 3 , -Still having sinus tachycardia . Partly from deconditioning and anemia. Seeing Dr. HiDebara Pickettrom cardiology  -Continues to be on Coreg  #8 neoplasm related pain is currently controlled without significant medications. Clinically likely has sleep apnea . Would need to be careful with sedative medications . 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 Gave her topical and oral antifungals to help with this.  -We will prescribe Clotrimazole for her fungal infection.  -Recommend water-insoluble Vitamin A and D ointment with anti fungal medication.   #12 Insomnia related to son's recent demise -trazodone 50-100m86m HS prn   -continue Nivolumab q2weeks-- plz schedule next 4 doses -US gKoreaded biopsy of rt inguinal mass --for mutation studies -labs q2weeks -RTC with Dr KaleIrene Limbo4 weeks    All of the patients questions were answered with apparent satisfaction. The patient knows to call the clinic with any problems, questions or concerns.  . The total time spent in the appointment was 25 minutes and more than 50% was  on counseling and direct patient cares.     GautSullivan LoneMS ALongfellowIVMS SCH [facility] Hafa Adai Specialist Groupatology/Oncology Physician [facility]):       [phone] cell):  [phone]):           [phone] document serves as a record of services personally performed by GautSullivan Lone. It was created on his behalf by SchuBaldwin Jamaicatrained medical scribe. The creation of this record is based on the scribe's personal observations and the provider's statements to them.   .I have reviewed the above documentation for accuracy and completeness, and I agree with the above. .GauBrunetta GeneraMS

## 2017-08-08 ENCOUNTER — Encounter: Payer: Self-pay | Admitting: Hematology

## 2017-08-08 ENCOUNTER — Inpatient Hospital Stay: Payer: Medicaid Other

## 2017-08-08 ENCOUNTER — Inpatient Hospital Stay: Payer: Medicaid Other | Attending: Hematology | Admitting: Hematology

## 2017-08-08 VITALS — BP 125/79 | HR 107 | Temp 98.3°F | Resp 20 | Ht 71.0 in | Wt 275.4 lb

## 2017-08-08 DIAGNOSIS — G629 Polyneuropathy, unspecified: Secondary | ICD-10-CM | POA: Insufficient documentation

## 2017-08-08 DIAGNOSIS — R21 Rash and other nonspecific skin eruption: Secondary | ICD-10-CM | POA: Diagnosis not present

## 2017-08-08 DIAGNOSIS — C3491 Malignant neoplasm of unspecified part of right bronchus or lung: Secondary | ICD-10-CM

## 2017-08-08 DIAGNOSIS — I10 Essential (primary) hypertension: Secondary | ICD-10-CM | POA: Diagnosis not present

## 2017-08-08 DIAGNOSIS — K8020 Calculus of gallbladder without cholecystitis without obstruction: Secondary | ICD-10-CM | POA: Insufficient documentation

## 2017-08-08 DIAGNOSIS — R000 Tachycardia, unspecified: Secondary | ICD-10-CM | POA: Insufficient documentation

## 2017-08-08 DIAGNOSIS — E7800 Pure hypercholesterolemia, unspecified: Secondary | ICD-10-CM | POA: Diagnosis not present

## 2017-08-08 DIAGNOSIS — E039 Hypothyroidism, unspecified: Secondary | ICD-10-CM | POA: Insufficient documentation

## 2017-08-08 DIAGNOSIS — Z5112 Encounter for antineoplastic immunotherapy: Secondary | ICD-10-CM | POA: Diagnosis not present

## 2017-08-08 DIAGNOSIS — G4700 Insomnia, unspecified: Secondary | ICD-10-CM

## 2017-08-08 DIAGNOSIS — Z923 Personal history of irradiation: Secondary | ICD-10-CM

## 2017-08-08 DIAGNOSIS — C773 Secondary and unspecified malignant neoplasm of axilla and upper limb lymph nodes: Secondary | ICD-10-CM | POA: Diagnosis not present

## 2017-08-08 DIAGNOSIS — R599 Enlarged lymph nodes, unspecified: Secondary | ICD-10-CM | POA: Insufficient documentation

## 2017-08-08 DIAGNOSIS — Z79899 Other long term (current) drug therapy: Secondary | ICD-10-CM | POA: Insufficient documentation

## 2017-08-08 DIAGNOSIS — G893 Neoplasm related pain (acute) (chronic): Secondary | ICD-10-CM | POA: Diagnosis not present

## 2017-08-08 DIAGNOSIS — M129 Arthropathy, unspecified: Secondary | ICD-10-CM

## 2017-08-08 DIAGNOSIS — Z9221 Personal history of antineoplastic chemotherapy: Secondary | ICD-10-CM | POA: Insufficient documentation

## 2017-08-08 DIAGNOSIS — R079 Chest pain, unspecified: Secondary | ICD-10-CM | POA: Diagnosis not present

## 2017-08-08 DIAGNOSIS — Z452 Encounter for adjustment and management of vascular access device: Secondary | ICD-10-CM

## 2017-08-08 DIAGNOSIS — Z95828 Presence of other vascular implants and grafts: Secondary | ICD-10-CM

## 2017-08-08 DIAGNOSIS — E1140 Type 2 diabetes mellitus with diabetic neuropathy, unspecified: Secondary | ICD-10-CM | POA: Insufficient documentation

## 2017-08-08 DIAGNOSIS — Z794 Long term (current) use of insulin: Secondary | ICD-10-CM | POA: Diagnosis not present

## 2017-08-08 DIAGNOSIS — Z8701 Personal history of pneumonia (recurrent): Secondary | ICD-10-CM

## 2017-08-08 DIAGNOSIS — C792 Secondary malignant neoplasm of skin: Secondary | ICD-10-CM

## 2017-08-08 LAB — CBC WITH DIFFERENTIAL (CANCER CENTER ONLY)
BASOS ABS: 0 10*3/uL (ref 0.0–0.1)
Basophils Relative: 0 %
Eosinophils Absolute: 0.3 10*3/uL (ref 0.0–0.5)
Eosinophils Relative: 6 %
HCT: 31.2 % — ABNORMAL LOW (ref 34.8–46.6)
HEMOGLOBIN: 9.9 g/dL — AB (ref 11.6–15.9)
LYMPHS ABS: 1 10*3/uL (ref 0.9–3.3)
LYMPHS PCT: 20 %
MCH: 28.6 pg (ref 25.1–34.0)
MCHC: 31.7 g/dL (ref 31.5–36.0)
MCV: 90.2 fL (ref 79.5–101.0)
Monocytes Absolute: 0.3 10*3/uL (ref 0.1–0.9)
Monocytes Relative: 7 %
Neutro Abs: 3.6 10*3/uL (ref 1.5–6.5)
Neutrophils Relative %: 67 %
PLATELETS: 280 10*3/uL (ref 145–400)
RBC: 3.46 MIL/uL — AB (ref 3.70–5.45)
RDW: 12.8 % (ref 11.2–14.5)
WBC: 5.3 10*3/uL (ref 3.9–10.3)

## 2017-08-08 LAB — CMP (CANCER CENTER ONLY)
ALBUMIN: 3.2 g/dL — AB (ref 3.5–5.0)
ALT: 8 U/L (ref 0–55)
ANION GAP: 6 (ref 3–11)
AST: 12 U/L (ref 5–34)
Alkaline Phosphatase: 88 U/L (ref 40–150)
BUN: 9 mg/dL (ref 7–26)
CHLORIDE: 104 mmol/L (ref 98–109)
CO2: 28 mmol/L (ref 22–29)
Calcium: 9.3 mg/dL (ref 8.4–10.4)
Creatinine: 0.81 mg/dL (ref 0.60–1.10)
GFR, Est AFR Am: >60 mL/min (ref >60)
GFR, Estimated: >60 mL/min (ref >60)
GLUCOSE: 240 mg/dL — AB (ref 70–140)
POTASSIUM: 3.9 mmol/L (ref 3.5–5.1)
Sodium: 138 mmol/L (ref 136–145)
Total Bilirubin: <0.2 mg/dL — ABNORMAL LOW (ref 0.2–1.2)
Total Protein: 7.5 g/dL (ref 6.4–8.3)

## 2017-08-08 LAB — TSH: TSH: 3.992 u[IU]/mL — AB (ref 0.308–3.960)

## 2017-08-08 LAB — RETICULOCYTES
RBC.: 3.46 MIL/uL — ABNORMAL LOW (ref 3.70–5.45)
RETIC COUNT ABSOLUTE: 58.8 10*3/uL (ref 33.7–90.7)
Retic Ct Pct: 1.7 % (ref 0.7–2.1)

## 2017-08-08 MED ORDER — CLINDAMYCIN PHOSPHATE 1 % EX GEL: Freq: Two times a day (BID) | CUTANEOUS | 0 refills | Status: DC

## 2017-08-08 MED ORDER — SODIUM CHLORIDE 0.9 % IV SOLN
240.0000 mg | Freq: Once | INTRAVENOUS | Status: AC
  Administered 2017-08-08: 240 mg via INTRAVENOUS
  Filled 2017-08-08: qty 24

## 2017-08-08 MED ORDER — HEPARIN SOD (PORK) LOCK FLUSH 100 UNIT/ML IV SOLN
500.0000 [IU] | Freq: Once | INTRAVENOUS | Status: AC | PRN
  Administered 2017-08-08: 500 [IU]
  Filled 2017-08-08: qty 5

## 2017-08-08 MED ORDER — LEVOTHYROXINE SODIUM 150 MCG PO TABS: 150.0000 ug | ORAL_TABLET | Freq: Every day | ORAL | 2 refills | Status: DC

## 2017-08-08 MED ORDER — LIDOCAINE-PRILOCAINE 2.5-2.5 % EX CREA: 1.0000 "application " | TOPICAL_CREAM | CUTANEOUS | 6 refills | Status: DC | PRN

## 2017-08-08 MED ORDER — SODIUM CHLORIDE 0.9% FLUSH
10.0000 mL | INTRAVENOUS | Status: DC | PRN
  Administered 2017-08-08: 10 mL
  Filled 2017-08-08: qty 10

## 2017-08-08 MED ORDER — SODIUM CHLORIDE 0.9 % IV SOLN
Freq: Once | INTRAVENOUS | Status: AC
  Administered 2017-08-08: 13:00:00 via INTRAVENOUS

## 2017-08-08 MED ORDER — OXYCODONE-ACETAMINOPHEN 5-325 MG PO TABS: 1.0000 | ORAL_TABLET | Freq: Four times a day (QID) | ORAL | 0 refills | Status: DC | PRN

## 2017-08-08 MED ORDER — OMEPRAZOLE 20 MG PO CPDR: 20.0000 mg | DELAYED_RELEASE_CAPSULE | Freq: Every day | ORAL | 1 refills | Status: DC

## 2017-08-08 MED ORDER — SODIUM CHLORIDE 0.9 % IJ SOLN
10.0000 mL | INTRAMUSCULAR | Status: DC | PRN
  Administered 2017-08-08: 10 mL via INTRAVENOUS
  Filled 2017-08-08: qty 10

## 2017-08-08 MED ORDER — DULOXETINE HCL 60 MG PO CPEP: 60.0000 mg | ORAL_CAPSULE | Freq: Every day | ORAL | 2 refills | Status: DC

## 2017-08-08 MED ORDER — ASPIRIN EC 81 MG PO TBEC: 81.0000 mg | DELAYED_RELEASE_TABLET | Freq: Every day | ORAL | 2 refills | Status: DC

## 2017-08-08 NOTE — Patient Instructions (Signed)
Thank you for choosing [facility] to provide your oncology and hematology care.  To afford each patient quality time with our providers, please arrive 30 minutes before your scheduled appointment time.  If you arrive late for your appointment, you may be asked to reschedule.  We strive to give you quality time with our providers, and arriving late affects you and other patients whose appointments are after yours.   If you are a no show for multiple scheduled visits, you may be dismissed from the clinic at the providers discretion.    Again, thank you for choosing [facility], our hope is that these requests will decrease the amount of time that you wait before being seen by our physicians.  ______________________________________________________________________  Should you have questions after your visit to the [facility], please contact our office at [phone] between the hours of 8:30 and 4:30 p.m.    Voicemails left after 4:30p.m will not be returned until the following business day.    For prescription refill requests, please have your pharmacy contact us directly.  Please also try to allow 48 hours for prescription requests.    Please contact the scheduling department for questions regarding scheduling.  For scheduling of procedures such as PET scans, CT scans, MRI, Ultrasound, etc please contact central scheduling at [phone].    Resources For Cancer Patients and Caregivers:   Oncolink.org:  A wonderful resource for patients and healthcare providers for information regarding your disease, ways to tract your treatment, what to expect, etc.     American Cancer Society:  800-227-2345  Can help patients locate various types of support and financial assistance  Cancer Care: 1-800-813-HOPE (4673) Provides financial assistance, online support groups, medication/co-pay assistance.    [place] DSS:  336-641-3447 Where to apply for food  stamps, Medicaid, and utility assistance  Medicare Rights Center: 800-333-4114 Helps people with Medicare understand their rights and benefits, navigate the Medicare system, and secure the quality healthcare they deserve  SCAT: 336-333-6589 [place] [employer]'s shared-ride transportation service for eligible riders who have a disability that prevents them from riding the fixed route bus.    For additional information on assistance programs please contact our social worker:   Grier Hock/Abigail Elmore:  [phone]            

## 2017-08-08 NOTE — Patient Instructions (Signed)
[facility] Discharge Instructions for Patients Receiving Chemotherapy  Today you received the following chemotherapy agents: Nivolumab  To help prevent nausea and vomiting after your treatment, we encourage you to take your nausea medication as directed.    If you develop nausea and vomiting that is not controlled by your nausea medication, call the clinic.   BELOW ARE SYMPTOMS THAT SHOULD BE REPORTED IMMEDIATELY:  *FEVER GREATER THAN 100.5 F  *CHILLS WITH OR WITHOUT FEVER  NAUSEA AND VOMITING THAT IS NOT CONTROLLED WITH YOUR NAUSEA MEDICATION  *UNUSUAL SHORTNESS OF BREATH  *UNUSUAL BRUISING OR BLEEDING  TENDERNESS IN MOUTH AND THROAT WITH OR WITHOUT PRESENCE OF ULCERS  *URINARY PROBLEMS  *BOWEL PROBLEMS  UNUSUAL RASH Items with * indicate a potential emergency and should be followed up as soon as possible.  Feel free to call the clinic should you have any questions or concerns. The clinic phone number is [phone].  Please show the CHEMO ALERT CARD at check-in to the Emergency Department and triage nurse.   

## 2017-08-09 NOTE — Progress Notes (Signed)
Prior auth for Clindamycin Phosphate 1% Gel has been submitted to [employer]. Status is pending.

## 2017-08-14 ENCOUNTER — Other Ambulatory Visit: Payer: Self-pay | Admitting: Radiology

## 2017-08-14 NOTE — Progress Notes (Signed)
Cardiology Office Note   Date:  08/15/2017   ID:  Marilyn [name], DOB January 21, 1966, MRN [id number]  PCP:  Marilyn Drafts, FNP  Cardiologist:  Dr. Debara Pickett  Chief Complaint  Patient presents with  . Hospitalization Follow-up  . Shortness of Breath     History of Present Illness: Marilyn [name] is a 52 y.o. female who presents for post posthospitalization follow-up with known history of nonischemic dilated cardiomyopathy, EF of 55%-60% by echo on 07/28/2017, metastatic lung cancer, asthma, diabetes, hyperlipidemia, hypertension, and anemia.    The patient was seen on recent hospitalization during consultation by Dr. Fransico [name] in the setting of chest pain.  The patient has undergone chemotherapy and radiation.  Chemo included carboplatin/Taxol, with niovlumab.  The patient was ruled out for ACS, aortic dissection, or PE.  The patient was not found to have coronary artery calcifications or pericardial effusion on CT scan.  Repeat echo revealed normal LV systolic function without wall motion abnormalities.  She was diagnosed with chest wall pain and did not require any additional cardiac work-up.  Patient comes today with continued chest discomfort chest wall pain.  She remains tachycardic and she states is been happening during hospitaliz ation.  She has recently been started on metoprolol 25 mg twice daily.  She is due to see oncology tomorrow for biopsy and her right groin.  Past Medical History:  Diagnosis Date  . Arthritis   . Asthma   . Diabetes mellitus    Type II  . Hemorrhoids   . Hypercholesteremia   . Hypertension   . Hypothyroidism   . Metastatic lung cancer (metastasis from lung to other site) (HCC)    Lung, Mets to skin- right flank. CT angio 2019 suspicious for axillary, external iliac, right inguinal mets  . 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 NICM (nonischemic cardiomyopathy) ([place])    a. mild - EF 45-50% by echo 10/2016 but EF 52% by low risk nuc  11/2016. b. Echo 07/2017 - EF normal, mild AI, unremarkable echo otherwise.  . Obese   . Pneumonia 2017  . Sinus tachycardia    a. dates back to at least 2013, etiology unclear.    Past Surgical History:  Procedure Laterality Date  . CESAREAN SECTION    . COLONOSCOPY    . I/D  arm Right 2013  . IR FLUORO GUIDE PORT INSERTION RIGHT  02/18/2017  . IR GENERIC HISTORICAL  01/08/2016   IR US GUIDE VASC ACCESS RIGHT 01/08/2016 [name], DO [facility]-INTERV RAD  . IR GENERIC HISTORICAL  01/08/2016   IR FLUORO GUIDE CV LINE RIGHT 01/08/2016 [name], DO [facility]-INTER[facility] RAD  . IR US GUIDE VASC ACCESS RIGHT  02/18/2017  . MULTIPLE EXTRACTIONS WITH ALVEOLOPLASTY N/A 03/25/2017   Procedure: MULTIPLE EXTRACTION WITH ALVEOLOPLASTY (teeth #s four, six, seven, eight, nine, ten, eleven, one, twenty-three, twenty-four, twenty-five, twenty-six, twenty-nine, thirty);  Surgeon: Marilyn [name], [name];  Location: [place];  Service: Oral Surgery;  Laterality: N/A;  . TUBAL LIGATION    . US guided core needle biopsy     of right lower neck/supraclavicular lymph nodes     Current Outpatient Medications  Medication Sig Dispense Refill  . aspirin EC 81 MG tablet Take 1 tablet (81 mg total) by mouth daily. 60 tablet 2  . clindamycin (CLINDAGEL) 1 % gel Apply topically 2 (two) times daily. To skin lesions in perineum -- folliculitis 30 g 0  . clotrimazole (CLOTRIMAZOLE ANTI-FUNGAL)  1 % cream Apply 1 application topically 2 (two) times daily. 30 g 0  . DULoxetine (CYMBALTA) 60 MG capsule Take 1 capsule (60 mg total) by mouth daily. 30 capsule 2  . insulin aspart (NOVOLOG) 100 UNIT/ML FlexPen As per sliding scale instructions (Patient taking differently: Inject 0-10 Units into the skin 3 (three) times daily. As per sliding scale instructions) 15 mL 2  . Insulin Degludec (TRESIBA FLEXTOUCH) 200 UNIT/ML SOPN Inject 100 Units into the skin daily before breakfast.     . ketoconazole (NIZORAL) 2 % cream Apply to rash once daily. Keep  area dry between applications. 60 g 0  . levothyroxine (SYNTHROID, LEVOTHROID) 150 MCG tablet Take 1 tablet (150 mcg total) by mouth daily before breakfast. 30 tablet 2  . lidocaine-prilocaine (EMLA) cream Apply 1 application topically as needed. 30 g 6  . loratadine (CLARITIN) 10 MG tablet Take 10 mg by mouth daily.    . methocarbamol (ROBAXIN) 500 MG tablet Take 1 tablet (500 mg total) by mouth every 8 (eight) hours as needed for muscle spasms. 8 tablet 0  . metoprolol tartrate (LOPRESSOR) 25 MG tablet Take 1 tablet (25 mg total) by mouth 2 (two) times daily. 60 tablet 0  . miconazole (MICOTIN) 2 % powder Apply topically 2 (two) times daily. Apply to area of rash over the perineum 85 g 1  . naproxen (NAPROSYN) 500 MG tablet Take 1 tablet (500 mg total) by mouth 2 (two) times daily with a meal. 20 tablet 0  . omeprazole (PRILOSEC) 20 MG capsule Take 1 capsule (20 mg total) by mouth daily. 30 capsule 1  . oxyCODONE-acetaminophen (PERCOCET) 5-325 MG tablet Take 1 tablet by mouth every 6 (six) hours as needed for moderate pain or severe pain. 50 tablet 0  . polyethylene glycol (MIRALAX) packet Take 17 g by mouth daily. 30 each 1  . senna-docusate (SENNA S) 8.6-50 MG tablet Take 2 tablets by mouth 2 (two) times daily. May reduce to 2 tab po HS as needed once regular BM estabished (Patient taking differently: Take 2 tablets by mouth 2 (two) times daily as needed (FOR CONSTIPATION). =) 60 tablet 1  . traZODone (DESYREL) 50 MG tablet Take 1-2 tablets (50-100 mg total) by mouth at bedtime as needed for sleep. 60 tablet 0   No current facility-administered medications for this visit.    Facility-Administered Medications Ordered in Other Visits  Medication Dose Route Frequency Provider Last Rate Last Dose  . sodium chloride 0.9 % injection 10 mL  10 mL Intravenous PRN [name], MD   10 mL at 05/24/16 1252    Allergies:   Patient has no known allergies.    Social History:  The patient   reports that she has never smoked. She has never used smokeless tobacco. She reports that she does not drink alcohol or use drugs.   Family History:  The patient's family history includes Heart failure in her mother; Hypertension in her mother.    ROS: All other systems are reviewed and negative. Unless otherwise mentioned in H&P    PHYSICAL EXAM: VS:  BP 105/76   Pulse (!) 108   Ht 5\' 9"  (1.753 m)   Wt 275 lb 6.4 oz (124.9 kg)   LMP 01/21/2016 Comment: pt signed preg test waiver 04/16/16   BMI 40.67 kg/m  , BMI Body mass index is 40.67 kg/m. GEN: Well nourished, well developed, in no acute distress  HEENT: normal  Neck: no JVD, carotid bruits, or  masses Cardiac: RRR, tachycardic; no murmurs, rubs, or gallops,no edema  Respiratory:  Clear to auscultation bilaterally, normal work of breathing GI: soft, nontender, nondistended, + BS MS: no deformity or atrophy  Skin: warm and dry, no rash Neuro:  Strength and sensation are intact Psych: euthymic mood, full affect   EKG: Not completed during this office visit have reviewed most recent EKG during hospitalization in April, 2019, this revealed sinus tachycardia heart rate 128 bpm.  (Prior to addition of metoprolol)  Recent Labs: 07/27/2017: B Natriuretic Peptide 43.2 07/29/2017: Magnesium 2.2 08/08/2017: ALT 8; BUN 9; Creatinine 0.81; Hemoglobin 9.9; Platelet Count 280; Potassium 3.9; Sodium 138; TSH 3.992    Lipid Panel No results found for: CHOL, TRIG, HDL, CHOLHDL, VLDL, LDLCALC, LDLDIRECT    Wt Readings from Last 3 Encounters:  08/15/17 275 lb 6.4 oz (124.9 kg)  08/08/17 275 lb 6.4 oz (124.9 kg)  07/26/17 266 lb 4.8 oz (120.8 kg)      Other studies Reviewed: ECHO:  07/27/2017 - Left ventricle: The cavity size was normal. Systolic function was normal. The estimated ejection fraction was in the range of 55% to 60%. Wall motion was normal; there were no regional wall motion abnormalities. Left ventricular diastolic  function parameters were normal. - Aortic valve: Trileaflet; mildly thickened, mildly calcified leaflets. There was mild regurgitation.   ASSESSMENT AND PLAN:  1.  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  I have advised her that she can take extra strength Tylenol as needed every 6 hours and ongoing pain management per oncology with stronger pain control as directed.  2.  Tachycardia: The patient has not taken her medications yet this morning and remains tachycardic here in the office at 108 bpm.  She is hypotensive. I have rechecked her blood pressure.  She admits to drinking a lot of caffeine.  I have advised her to avoid that to avoid dehydration and rapid heart rhythm.  She will continue metoprolol as directed  3.  Hypertension: Patient is hypotensive today on losartan.  Which she has not taken yet today.  I will have her hold losartan for a couple of weeks to evaluate her response to this.  May need to have her on the lower medication regimen.  She is advised to take her blood pressure twice a day and recorded with a 2-week follow-up in our office for evaluation of her blood pressure and review of recordings.  If blood pressure remains greater than 734 systolic she is to call us and we will restart her on lower dose regimen.  4. Lung CA: Followed by oncology.  Current medicines are reviewed at length with the patient today.    Labs/ tests ordered today include: None  Phill Myron. West Pugh, ANP, AACC   08/15/2017 9:57 AM    [facility] [address] [place], [place] [address] Phone: [phone]; Fax: [phone]

## 2017-08-15 ENCOUNTER — Encounter: Payer: Self-pay | Admitting: Adult Health

## 2017-08-15 ENCOUNTER — Ambulatory Visit: Payer: Medicaid Other | Admitting: Adult Health

## 2017-08-15 VITALS — BP 105/76 | HR 108 | Ht 69.0 in | Wt 275.4 lb

## 2017-08-15 DIAGNOSIS — I10 Essential (primary) hypertension: Secondary | ICD-10-CM | POA: Diagnosis not present

## 2017-08-15 DIAGNOSIS — R079 Chest pain, unspecified: Secondary | ICD-10-CM | POA: Diagnosis not present

## 2017-08-15 DIAGNOSIS — I428 Other cardiomyopathies: Secondary | ICD-10-CM | POA: Diagnosis not present

## 2017-08-15 NOTE — Patient Instructions (Signed)
Medication Instructions:  Stop: Losartan 25 mg    Follow-Up: Your physician recommends that you schedule a follow-up appointment in: 2 weeks Pharmacist for Blood Pressure check  Your physician recommends that you schedule a follow-up appointment in: 1 month with Dr. Jory Sims, NP    Any Other Special Instructions Will Be Listed Below (If Applicable). Check Blood Pressure two times a day around the same time.    If you need a refill on your cardiac medications before your next appointment, please call your pharmacy.

## 2017-08-16 ENCOUNTER — Encounter (HOSPITAL_COMMUNITY): Payer: Self-pay

## 2017-08-16 ENCOUNTER — Ambulatory Visit (HOSPITAL_COMMUNITY)
Admission: RE | Admit: 2017-08-16 | Discharge: 2017-08-16 | Disposition: A | Discharge status: Home / Self Care | Payer: Medicaid Other | Source: Ambulatory Visit | Attending: Hematology | Admitting: Hematology

## 2017-08-16 DIAGNOSIS — Z6841 Body Mass Index (BMI) 40.0 and over, adult: Secondary | ICD-10-CM | POA: Insufficient documentation

## 2017-08-16 DIAGNOSIS — J45909 Unspecified asthma, uncomplicated: Secondary | ICD-10-CM | POA: Insufficient documentation

## 2017-08-16 DIAGNOSIS — Z85118 Personal history of other malignant neoplasm of bronchus and lung: Secondary | ICD-10-CM | POA: Insufficient documentation

## 2017-08-16 DIAGNOSIS — Z923 Personal history of irradiation: Secondary | ICD-10-CM | POA: Diagnosis not present

## 2017-08-16 DIAGNOSIS — Z794 Long term (current) use of insulin: Secondary | ICD-10-CM | POA: Insufficient documentation

## 2017-08-16 DIAGNOSIS — Z7982 Long term (current) use of aspirin: Secondary | ICD-10-CM | POA: Diagnosis not present

## 2017-08-16 DIAGNOSIS — C3491 Malignant neoplasm of unspecified part of right bronchus or lung: Secondary | ICD-10-CM | POA: Diagnosis present

## 2017-08-16 DIAGNOSIS — I10 Essential (primary) hypertension: Secondary | ICD-10-CM | POA: Diagnosis not present

## 2017-08-16 DIAGNOSIS — E7800 Pure hypercholesterolemia, unspecified: Secondary | ICD-10-CM | POA: Diagnosis not present

## 2017-08-16 DIAGNOSIS — Z79890 Hormone replacement therapy (postmenopausal): Secondary | ICD-10-CM | POA: Diagnosis not present

## 2017-08-16 DIAGNOSIS — E039 Hypothyroidism, unspecified: Secondary | ICD-10-CM | POA: Diagnosis not present

## 2017-08-16 DIAGNOSIS — E669 Obesity, unspecified: Secondary | ICD-10-CM | POA: Insufficient documentation

## 2017-08-16 DIAGNOSIS — Z9221 Personal history of antineoplastic chemotherapy: Secondary | ICD-10-CM | POA: Insufficient documentation

## 2017-08-16 DIAGNOSIS — R590 Localized enlarged lymph nodes: Secondary | ICD-10-CM | POA: Insufficient documentation

## 2017-08-16 DIAGNOSIS — E1140 Type 2 diabetes mellitus with diabetic neuropathy, unspecified: Secondary | ICD-10-CM | POA: Diagnosis not present

## 2017-08-16 DIAGNOSIS — I429 Cardiomyopathy, unspecified: Secondary | ICD-10-CM | POA: Insufficient documentation

## 2017-08-16 DIAGNOSIS — Z79899 Other long term (current) drug therapy: Secondary | ICD-10-CM | POA: Diagnosis not present

## 2017-08-16 LAB — CBC WITH DIFFERENTIAL/PLATELET
BASOS ABS: 0 10*3/uL (ref 0.0–0.1)
BASOS PCT: 0 %
Eosinophils Absolute: 0.3 10*3/uL (ref 0.0–0.7)
Eosinophils Relative: 7 %
HEMATOCRIT: 29.9 % — AB (ref 36.0–46.0)
Hemoglobin: 9.7 g/dL — ABNORMAL LOW (ref 12.0–15.0)
Lymphocytes Relative: 29 %
Lymphs Abs: 1.3 10*3/uL (ref 0.7–4.0)
MCH: 28.9 pg (ref 26.0–34.0)
MCHC: 32.4 g/dL (ref 30.0–36.0)
MCV: 89 fL (ref 78.0–100.0)
Monocytes Absolute: 0.2 10*3/uL (ref 0.1–1.0)
Monocytes Relative: 5 %
NEUTROS ABS: 2.7 10*3/uL (ref 1.7–7.7)
NEUTROS PCT: 59 %
PLATELETS: 257 10*3/uL (ref 150–400)
RBC: 3.36 MIL/uL — ABNORMAL LOW (ref 3.87–5.11)
RDW: 12.5 % (ref 11.5–15.5)
WBC: 4.5 10*3/uL (ref 4.0–10.5)

## 2017-08-16 LAB — PROTIME-INR
INR: 0.99
Prothrombin Time: 13 seconds (ref 11.4–15.2)

## 2017-08-16 LAB — GLUCOSE, CAPILLARY: Glucose-Capillary: 243 mg/dL — ABNORMAL HIGH (ref 65–99)

## 2017-08-16 MED ORDER — HEPARIN SOD (PORK) LOCK FLUSH 100 UNIT/ML IV SOLN
500.0000 [IU] | INTRAVENOUS | Status: DC | PRN
  Filled 2017-08-16: qty 5

## 2017-08-16 MED ORDER — SODIUM CHLORIDE 0.9 % IV SOLN
INTRAVENOUS | Status: DC
  Administered 2017-08-16: 11:00:00 via INTRAVENOUS

## 2017-08-16 MED ORDER — MIDAZOLAM HCL 2 MG/2ML IJ SOLN
INTRAMUSCULAR | Status: AC
  Filled 2017-08-16: qty 4

## 2017-08-16 MED ORDER — LIDOCAINE HCL 1 % IJ SOLN
INTRAMUSCULAR | Status: AC
  Filled 2017-08-16: qty 20

## 2017-08-16 MED ORDER — FENTANYL CITRATE (PF) 100 MCG/2ML IJ SOLN
INTRAMUSCULAR | Status: AC
  Filled 2017-08-16: qty 4

## 2017-08-16 MED ORDER — MIDAZOLAM HCL 2 MG/2ML IJ SOLN
INTRAMUSCULAR | Status: AC | PRN
  Administered 2017-08-16: 1 mg via INTRAVENOUS

## 2017-08-16 MED ORDER — FENTANYL CITRATE (PF) 100 MCG/2ML IJ SOLN
INTRAMUSCULAR | Status: AC | PRN
  Administered 2017-08-16: 50 ug via INTRAVENOUS

## 2017-08-16 NOTE — Discharge Instructions (Signed)

## 2017-08-16 NOTE — Procedures (Signed)
US guided core biopsies of right inguinal lymph node.  6 cores obtained.  Minimal blood loss and no immediate complication.

## 2017-08-16 NOTE — H&P (Signed)
Referring Physician(s): [name]  Supervising Physician: Markus Daft  Patient Status:  [facility]  Chief Complaint:  "I'm having a biopsy"  Subjective: Pt familiar to IR service from prior right supraclavicular lymph node biopsy in 2017, PICC line placement in 2017, right flank mass biopsy in 2018 and Port-A-Cath placement on 02/19/2017.  She has a history of metastatic lung adenocarcinoma, status post chemoradiation and now immunotherapy.  Recent PET scan shows hypermetabolic right axillary lymphadenopathy, right lower lobe lung and mediastinal adenopathy hypermetabolism as well as 5 cm soft tissue mass in the upper right inguinal area which is hypermetabolic and concerning for metastatic adenopathy.  She presents today for image guided biopsy of this right inguinal adenopathy for further evaluation.  She currently denies fever, headache, chest pain, nausea, vomiting or bleeding.  She does have dyspnea, occasional cough, pelvic discomfort. Past Medical History:  Diagnosis Date  . Arthritis   . Asthma   . Diabetes mellitus    Type II  . Hemorrhoids   . Hypercholesteremia   . Hypertension   . Hypothyroidism   . Metastatic lung cancer (metastasis from lung to other site) (HCC)    Lung, Mets to skin- right flank. CT angio 2019 suspicious for axillary, external iliac, right inguinal mets  . 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 NICM (nonischemic cardiomyopathy) ([facility])    a. mild - EF 45-50% by echo 10/2016 but EF 52% by low risk nuc 11/2016. b. Echo 07/2017 - EF normal, mild AI, unremarkable echo otherwise.  . Obese   . Pneumonia 2017  . Sinus tachycardia    a. dates back to at least 2013, etiology unclear.   Past Surgical History:  Procedure Laterality Date  . CESAREAN SECTION    . COLONOSCOPY    . I/D  arm Right 2013  . IR FLUORO GUIDE PORT INSERTION RIGHT  02/18/2017  . IR GENERIC HISTORICAL  01/08/2016   IR US GUIDE VASC ACCESS RIGHT 01/08/2016 [name], DO [facility]  . IR GENERIC HISTORICAL  01/08/2016   IR FLUORO GUIDE CV LINE RIGHT 01/08/2016 [name], DO [facility]  . IR US GUIDE VASC ACCESS RIGHT  02/18/2017  . MULTIPLE EXTRACTIONS WITH ALVEOLOPLASTY N/A 03/25/2017   Procedure: MULTIPLE EXTRACTION WITH ALVEOLOPLASTY (teeth #s four, six, seven, eight, nine, ten, eleven, one, twenty-three, twenty-four, twenty-five, twenty-six, twenty-nine, thirty);  Surgeon: [name], [name];  Location: [place];  Service: Oral Surgery;  Laterality: N/A;  . TUBAL LIGATION    . US guided core needle biopsy     of right lower neck/supraclavicular lymph nodes      Allergies: Patient has no known allergies.  Medications: Prior to Admission medications   Medication Sig Start Date End Date Taking? Authorizing Provider  aspirin EC 81 MG tablet Take 1 tablet (81 mg total) by mouth daily. 08/08/17  Yes [name], MD  clotrimazole (CLOTRIMAZOLE ANTI-FUNGAL) 1 % cream Apply 1 application topically 2 (two) times daily. 07/17/17  Yes [name], MD  DULoxetine (CYMBALTA) 60 MG capsule Take 1 capsule (60 mg total) by mouth daily. 08/08/17  Yes [name], MD  insulin aspart (NOVOLOG) 100 UNIT/ML FlexPen As per sliding scale instructions Patient taking differently: Inject 0-10 Units into the skin 3 (three) times daily. As per sliding scale instructions 01/16/16  Yes [name], MD  Insulin Degludec (TRESIBA FLEXTOUCH) 200 UNIT/ML SOPN Inject 100 Units into the skin daily before breakfast.  Yes [provider]  ketoconazole (NIZORAL) 2 % cream Apply to rash once daily. Keep area dry between applications. 07/20/17  Yes Tanner, Lyndon Code., PA-C  levothyroxine (SYNTHROID, LEVOTHROID) 150 MCG tablet Take 1 tablet (150 mcg total) by mouth daily before breakfast. 08/08/17  Yes [name], MD  lidocaine-prilocaine (EMLA) cream Apply 1 application topically as needed. 08/08/17  Yes [name], MD    metoprolol tartrate (LOPRESSOR) 25 MG tablet Take 1 tablet (25 mg total) by mouth 2 (two) times daily. 08/05/17  Yes [name], [name], MD  naproxen (NAPROSYN) 500 MG tablet Take 1 tablet (500 mg total) by mouth 2 (two) times daily with a meal. 06/21/17  Yes Nanavati, Ankit, MD  omeprazole (PRILOSEC) 20 MG capsule Take 1 capsule (20 mg total) by mouth daily. 08/08/17  Yes [name], MD  oxyCODONE-acetaminophen (PERCOCET) 5-325 MG tablet Take 1 tablet by mouth every 6 (six) hours as needed for moderate pain or severe pain. 08/08/17  Yes [name], MD  polyethylene glycol [facility]) packet Take 17 g by mouth daily. 07/11/17  Yes [name], MD  senna-docusate (SENNA S) 8.6-50 MG tablet Take 2 tablets by mouth 2 (two) times daily. May reduce to 2 tab po HS as needed once regular BM estabished Patient taking differently: Take 2 tablets by mouth 2 (two) times daily as needed (FOR CONSTIPATION). = 05/24/16  Yes [name], MD  clindamycin (CLINDAGEL) 1 % gel Apply topically 2 (two) times daily. To skin lesions in perineum -- folliculitis 04/13/60   [name], MD  loratadine (CLARITIN) 10 MG tablet Take 10 mg by mouth daily.    [provider]  methocarbamol (ROBAXIN) 500 MG tablet Take 1 tablet (500 mg total) by mouth every 8 (eight) hours as needed for muscle spasms. 06/03/17   [name], MD  miconazole (MICOTIN) 2 % powder Apply topically 2 (two) times daily. Apply to area of rash over the perineum 03/21/17   [name], MD  traZODone (DESYREL) 50 MG tablet Take 1-2 tablets (50-100 mg total) by mouth at bedtime as needed for sleep. 05/16/17   [name], MD  prochlorperazine (COMPAZINE) 10 MG tablet Take 1 tablet (10 mg total) by mouth every 6 (six) hours as needed (Nausea or vomiting). 01/01/16 03/03/16  [name], MD     Vital Signs: BP 128/83 (BP Location: Right Arm)   Pulse (!) 112   Temp 98.4 F (36.9 C) (Oral)    Resp 18   Ht 5\' 9"  (1.753 m)   Wt 275 lb (124.7 kg)   LMP 01/21/2016 Comment: pt signed preg test waiver 04/16/16   SpO2 100%   BMI 40.61 kg/m   Physical Exam awake, alert.  Chest clear to auscultation bilaterally.  Clean, intact right chest wall Port-A-Cath.  Heart with tachycardic but regular rhythm.  Abdomen obese, soft, positive bowel sounds, mild pelvic tenderness to palpation; trace pretibial edema bilaterally.  Imaging: No results found.  Labs:  CBC: Recent Labs    07/28/17 0859 07/29/17 0421 08/08/17 0830 08/16/17 1115  WBC 5.7 5.1 5.3 4.5  HGB 8.9* 8.5* 9.9* 9.7*  HCT 27.9* 26.3* 31.2* 29.9*  PLT 244 245 280 257    COAGS: Recent Labs    11/10/16 1135 02/18/17 1240 07/27/17 0158 08/16/17 1115  INR 1.00 1.19 0.97 0.99  APTT 30  --  30  --     BMP: Recent Labs    07/25/17 0951  07/27/17 0207 07/28/17 0859 07/29/17 0421 08/08/17 0830  NA 140 141 137 142 138  K 3.9 3.5 4.0 3.6 3.9  CL 106 105 105 109 104  CO2 25  --  23 25 28   GLUCOSE 225* 247* 256* 114* 240*  BUN 8 5* 7 5* 9  CALCIUM 8.8  --  8.1* 7.8* 9.3  CREATININE 0.82 0.60 0.70  0.74 0.56 0.81  GFRNONAA >60  --  >60  >60 >60 >60  GFRAA >60  --  >60  >60 >60 >60    LIVER FUNCTION TESTS: Recent Labs    07/25/17 0951 07/28/17 0859 07/29/17 0421 08/08/17 0830  BILITOT <0.2* 0.2* 0.6 <0.2*  AST 14 12* 14* 12  ALT 12 11* 11* 8  ALKPHOS 89 72 67 88  PROT 7.5 6.2* 6.1* 7.5  ALBUMIN 3.1* 2.6* 2.5* 3.2*    Assessment and Plan: Pt with history of metastatic lung adenocarcinoma, status post chemoradiation and now immunotherapy.  Recent PET scan shows hypermetabolic right axillary lymphadenopathy, right lower lobe lung and mediastinal adenopathy hypermetabolism as well as 5 cm soft tissue mass in the upper right inguinal area which is hypermetabolic and concerning for metastatic adenopathy.  She presents today for image guided biopsy of this right inguinal adenopathy for further evaluation.  Risks and benefits discussed with the patient including, but not limited to bleeding, infection, damage to adjacent structures or low yield requiring additional tests.  All of the patient's questions were answered, patient is agreeable to proceed. Consent signed and in chart.     Electronically Signed: D. Rowe Robert, PA-C 08/16/2017, 11:52 AM   I spent a total of 20 minutes at the the patient's bedside AND on the patient's hospital floor or unit, greater than 50% of which was counseling/coordinating care for image guided right inguinal mass/nodal biopsy

## 2017-08-18 NOTE — Progress Notes (Signed)
[name]    HEMATOLOGY/ONCOLOGY CLINIC NOTE  Date of Service: .08/22/2017   Patient Care Team: [name], FNP as PCP - General (Nurse Practitioner) Pixie Casino, MD as PCP - Cardiology (Cardiology)  CHIEF COMPLAINTS  F/u for continued management of metastatic lung adenocarcinoma  Diagnosis Metastatic Lung Adenocarcinoma with rt flank subcutaneous metastasis.  Current treatment:  Nivolumab q2 weeks  PreviousTreatment -Concurrent Chemo-radiation with Carboplatin/Taxol. Completed RT on 02/13/2016 and has then completed 2 cycles of carboplatin + taxol. -Started Maintenance Durvalumab from  05/24/2016 q2weeks, switched to Nivolumab q2weeks on 12/13/16 once metastatic disease noted -s/p palliative RT to rt sided Subcutaneous metastatic mass.    HISTORY OF PRESENTING ILLNESS: Plz see my previous note for details on initial presentation.  INTERVAL HISTORY   Marilyn [name] presents to the office today for followup of her metastatic lung cancer. The patient's last visit with [place] was on 08/08/17. The pt reports that she is doing well overall.   The pt reports that her chest wall pain continues and she also endorses that her SOB has gotten better. She notes that her chest wall pain is constant.   She notes that her weight is stable and her appetite is fair.  She notes intermittent inguinal pain and right axillary pain.   She notes that she continues taking her thyroid medication every morning without food.   Of note since the patient's last visit, pt has had a bx completed on 08/16/17 with results revealing METASTATIC POORLY DIFFERENTIATED CARCINOMA, CONSISTENT WITH PATIENT'S CLINICAL HISTORY OF A PRIMARY LUNG ADENOCARCINOMA.  Lab results today (08/22/17) of CBC, CMP, and Reticulocytes is as follows: all values are WNL except for RBC at 3.35, Hgb at 9.7, HCT at 30.0, Glucose at 184, Albumin at 3.2.  On review of systems, pt reports tender chest to palpation, right axillary pain,  intermittent inguinal pain, better SOB, occasional coughing,  and denies abdominal pain, bowel irregularities, leg swelling, problems passing urine, neuropathy, and any other symptoms.    MEDICAL HISTORY:  Past Medical History:  Diagnosis Date  . Arthritis   . Asthma   . Diabetes mellitus    Type II  . Hemorrhoids   . Hypercholesteremia   . Hypertension   . Hypothyroidism   . Metastatic lung cancer (metastasis from lung to other site) (HCC)    Lung, Mets to skin- right flank. CT angio 2019 suspicious for axillary, external iliac, right inguinal mets  . 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 NICM (nonischemic cardiomyopathy) ([place])    a. mild - EF 45-50% by echo 10/2016 but EF 52% by low risk nuc 11/2016. b. Echo 07/2017 - EF normal, mild AI, unremarkable echo otherwise.  . Obese   . Pneumonia 2017  . Sinus tachycardia    a. dates back to at least 2013, etiology unclear.    SURGICAL HISTORY: Past Surgical History:  Procedure Laterality Date  . CESAREAN SECTION    . COLONOSCOPY    . I/D  arm Right 2013  . IR FLUORO GUIDE PORT INSERTION RIGHT  02/18/2017  . IR GENERIC HISTORICAL  01/08/2016   IR US GUIDE VASC ACCESS RIGHT 01/08/2016 [name], DO [facility]  . IR GENERIC HISTORICAL  01/08/2016   IR FLUORO GUIDE CV LINE RIGHT 01/08/2016 [name], DO [facility]  . IR US GUIDE VASC ACCESS RIGHT  02/18/2017  . MULTIPLE EXTRACTIONS WITH ALVEOLOPLASTY N/A 03/25/2017   Procedure: MULTIPLE EXTRACTION WITH ALVEOLOPLASTY (teeth #  s four, six, seven, eight, nine, ten, eleven, one, twenty-three, twenty-four, twenty-five, twenty-six, twenty-nine, thirty);  Surgeon: [name], [name];  Location: [facility];  Service: Oral Surgery;  Laterality: N/A;  . TUBAL LIGATION    . US guided core needle biopsy     of right lower neck/supraclavicular lymph nodes    SOCIAL HISTORY: Social History   Socioeconomic History  . Marital status: Divorced    Spouse name: Not on file    . Number of children: Not on file  . Years of education: Not on file  . Highest education level: Not on file  Occupational History  . Not on file  Social Needs  . Financial resource strain: Not on file  . Food insecurity:    Worry: Not on file    Inability: Not on file  . Transportation needs:    Medical: Not on file    Non-medical: Not on file  Tobacco Use  . Smoking status: Never Smoker  . Smokeless tobacco: Never Used  Substance and Sexual Activity  . Alcohol use: No  . Drug use: No  . Sexual activity: Not Currently  Lifestyle  . Physical activity:    Days per week: Not on file    Minutes per session: Not on file  . Stress: Not on file  Relationships  . Social connections:    Talks on phone: Not on file    Gets together: Not on file    Attends religious service: Not on file    Active member of club or organization: Not on file    Attends meetings of clubs or organizations: Not on file    Relationship status: Not on file  . Intimate partner violence:    Fear of current or ex partner: Not on file    Emotionally abused: Not on file    Physically abused: Not on file    Forced sexual activity: Not on file  Other Topics Concern  . Not on file  Social History Narrative   No bird or mold exposure. No recent travel.    FAMILY HISTORY: Family History  Problem Relation Age of Onset  . Heart failure Mother   . Hypertension Mother   . Cancer Neg Hx   . Rheumatologic disease Neg Hx     ALLERGIES:  has No Known Allergies.  MEDICATIONS:  Current Outpatient Medications  Medication Sig Dispense Refill  . aspirin EC 81 MG tablet Take 1 tablet (81 mg total) by mouth daily. 60 tablet 2  . clindamycin (CLINDAGEL) 1 % gel Apply topically 2 (two) times daily. To skin lesions in perineum -- folliculitis 30 g 0  . clotrimazole (CLOTRIMAZOLE ANTI-FUNGAL) 1 % cream Apply 1 application topically 2 (two) times daily. 30 g 0  . DULoxetine (CYMBALTA) 60 MG capsule Take 1 capsule (60  mg total) by mouth daily. 30 capsule 2  . insulin aspart (NOVOLOG) 100 UNIT/ML FlexPen As per sliding scale instructions (Patient taking differently: Inject 0-10 Units into the skin 3 (three) times daily. As per sliding scale instructions) 15 mL 2  . Insulin Degludec (TRESIBA FLEXTOUCH) 200 UNIT/ML SOPN Inject 100 Units into the skin daily before breakfast.     . ketoconazole (NIZORAL) 2 % cream Apply to rash once daily. Keep area dry between applications. 60 g 0  . levothyroxine (SYNTHROID, LEVOTHROID) 150 MCG tablet Take 1 tablet (150 mcg total) by mouth daily before breakfast. 30 tablet 2  . lidocaine-prilocaine (EMLA) cream Apply 1 application topically as needed.  30 g 6  . loratadine (CLARITIN) 10 MG tablet Take 10 mg by mouth daily.    . methocarbamol (ROBAXIN) 500 MG tablet Take 1 tablet (500 mg total) by mouth every 8 (eight) hours as needed for muscle spasms. 8 tablet 0  . metoprolol tartrate (LOPRESSOR) 25 MG tablet Take 1 tablet (25 mg total) by mouth 2 (two) times daily. 60 tablet 0  . miconazole (MICOTIN) 2 % powder Apply topically 2 (two) times daily. Apply to area of rash over the perineum 85 g 1  . naproxen (NAPROSYN) 500 MG tablet Take 1 tablet (500 mg total) by mouth 2 (two) times daily with a meal. 20 tablet 0  . omeprazole (PRILOSEC) 20 MG capsule Take 1 capsule (20 mg total) by mouth daily. 30 capsule 1  . oxyCODONE-acetaminophen (PERCOCET) 5-325 MG tablet Take 1 tablet by mouth every 6 (six) hours as needed for moderate pain or severe pain. 50 tablet 0  . polyethylene glycol (MIRALAX) packet Take 17 g by mouth daily. 30 each 1  . senna-docusate (SENNA S) 8.6-50 MG tablet Take 2 tablets by mouth 2 (two) times daily. May reduce to 2 tab po HS as needed once regular BM estabished (Patient taking differently: Take 2 tablets by mouth 2 (two) times daily as needed (FOR CONSTIPATION). =) 60 tablet 1  . traZODone (DESYREL) 50 MG tablet Take 1-2 tablets (50-100 mg total) by mouth at  bedtime as needed for sleep. 60 tablet 0   No current facility-administered medications for this visit.    Facility-Administered Medications Ordered in Other Visits  Medication Dose Route Frequency Provider Last Rate Last Dose  . sodium chloride 0.9 % injection 10 mL  10 mL Intravenous PRN [name], MD   10 mL at 05/24/16 1252    REVIEW OF SYSTEMS:   A 10+ POINT REVIEW OF SYSTEMS WAS OBTAINED including neurology, dermatology, psychiatry, cardiac, respiratory, lymph, extremities, GI, GU, Musculoskeletal, constitutional, breasts, reproductive, HEENT.  All pertinent positives are noted in the HPI.  All others are negative.   PHYSICAL EXAMINATION: ECOG PERFORMANCE STATUS: 1 - Symptomatic but completely ambulatory  . Vitals:   08/22/17 1128  BP: 116/80  Pulse: (!) 101  Resp: 18  Temp: 98.5 F (36.9 C)  SpO2: 98%   Filed Weights   08/22/17 1128  Weight: 274 lb 9.6 oz (124.6 kg)   .Body mass index is 40.55 kg/m.  GENERAL:alert, in no acute distress and comfortable SKIN: no acute rashes, no significant lesions EYES: conjunctiva are pink and non-injected, sclera anicteric OROPHARYNX: MMM, no exudates, no oropharyngeal erythema or ulceration NECK: supple, no JVD LYMPH:  no palpable lymphadenopathy in the cervical, axillary or inguinal regions LUNGS: clear to auscultation b/l with normal respiratory effort HEART: regular rate & rhythm ABDOMEN:  normoactive bowel sounds , non tender, not distended. Extremity: no pedal edema PSYCH: alert & oriented x 3 with fluent speech NEURO: no focal motor/sensory deficits    LABORATORY DATA: .  [name] CBC Latest Ref Rng & Units 08/22/2017 08/16/2017 08/08/2017  WBC 3.9 - 10.3 K/uL 4.2 4.5 5.3  Hemoglobin 11.6 - 15.9 g/dL 9.7(L) 9.7(L) 9.9(L)  Hematocrit 34.8 - 46.6 % 30.0(L) 29.9(L) 31.2(L)  Platelets 145 - 400 K/uL 238 257 280  HGB 9.5  . CMP Latest Ref Rng & Units 08/22/2017 08/08/2017 07/29/2017  Glucose 70 - 140 mg/dL 184(H)  240(H) 114(H)  BUN 7 - 26 mg/dL 7 9 5(L)  Creatinine 0.60 - 1.10 mg/dL 0.74 0.81 0.56  Sodium 136 - 145 mmol/L 139 138 142  Potassium 3.5 - 5.1 mmol/L 3.9 3.9 3.6  Chloride 98 - 109 mmol/L 106 104 109  CO2 22 - 29 mmol/L '27 28 25  ' Calcium 8.4 - 10.4 mg/dL 8.8 9.3 7.8(L)  Total Protein 6.4 - 8.3 g/dL 7.3 7.5 6.1(L)  Total Bilirubin 0.2 - 1.2 mg/dL 0.2 <0.2(L) 0.6  Alkaline Phos 40 - 150 U/L 94 88 67  AST 5 - 34 U/L 13 12 14(L)  ALT 0 - 55 U/L 13 8 11(L)   Component     Latest Ref Rng & Units 08/08/2017  TSH     0.308 - 3.960 uIU/mL 3.992 (H)              08/18/17 Bx:    RADIOGRAPHIC STUDIES:  .Ct Abdomen Pelvis Wo Contrast  Result Date: 07/28/2017 CLINICAL DATA:  chief complaint of chest pain. Patient has history of CHF, diabetes, lung cancer -with metastases to right flank. Patient states that she started having sudden onset chest pain Tuesday/ Pain initially was in the right flank but then also moved anteriorly. Pain is described as "sharp and grabbing pain". EXAM: CT ABDOMEN AND PELVIS WITHOUT CONTRAST TECHNIQUE: Multidetector CT imaging of the abdomen and pelvis was performed following the standard protocol without IV contrast. COMPARISON:  Chest, abdomen and pelvis CT, 05/11/2016. FINDINGS: Lower chest: Small right pleural effusion, new since the prior CT. There is opacity extending posteriorly and laterally from the inferior right hilum consistent with treatment related scarring, stable from the prior CT. Additional scarring and/or atelectasis is noted at the lung bases. Hepatobiliary: Liver is unremarkable. There are multiple gallstones. No evidence of acute cholecystitis. No bile duct dilation. Pancreas: Unremarkable. No pancreatic ductal dilatation or surrounding inflammatory changes. Spleen: Normal in size without focal abnormality. Adrenals/Urinary Tract: No adrenal masses. Mild prominence of the right intrarenal collecting system. Subtle haziness is noted along the right  renal contour, mostly inferiorly. No renal masses. No intrarenal stones. Normal left intrarenal collecting system. There are 2 calcifications adjacent to the right ureter which project within the gonadal vein. No convincing right ureteral stone. Both ureters are normal course and caliber. No bladder wall thickening, mass or stone. Stomach/Bowel: Stomach is within normal limits. Appendix appears normal. No evidence of bowel wall thickening, distention, or inflammatory changes. Vascular/Lymphatic: Right inguinal mass measuring 5 x 3.4 x 3.9 cm, previously 4.3 x 2.8 x 3.4 cm. This is consistent with an enlarged node or conglomeration of nodes. Right external iliac node lies adjacent to this measuring 16 mm in short axis, unchanged. No other adenopathy. Minor aortic atherosclerotic calcifications.  No aneurysm. Reproductive: Uterus mildly enlarged by a poorly defined fibroid, stable from the prior study. No ovarian/adnexal masses. Other: No hernia or ascites. Fluid attenuation tracks along the right lateral abdominal wall musculature. There is an area of soft tissue attenuation in the deep subcutaneous fat of the right flank, stable from the prior CT. This measures 4.9 x 3.0 x 2.9 cm. Musculoskeletal: No fracture or acute finding. No osteoblastic or osteolytic lesions. IMPRESSION: 1. Mild prominence of the right intrarenal collecting system, with right ureter being normal in caliber with no convincing stone. There are 2 stones that lie directly adjacent to the right ureter, but these appear to reside in the gonadal vein, and are stable from the prior CT. Findings support a recently passed stone in the proper clinical setting. 2. Small amount of fluid attenuation tracks along the right lateral abdominal wall musculature,  nonspecific. 3. Soft tissue density along the right lateral abdominal wall soft tissues, abutting the underlying musculature, is stable from the prior CT. This may reflect a body wall metastatic lesion  as described on prior CTs. 4. Multiple gallstones.  No evidence of acute cholecystitis. 5. Small right pleural effusion, new since the prior study. Stable right lung changes consistent with scarring from prior radiation therapy. 6. Right inferior external iliac and inguinal adenopathy consistent with metastatic disease. The larger conglomeration of inguinal lymph nodes has increased in size from the prior CT as detailed above. Electronically Signed   By: Lajean Manes M.D.   On: 07/28/2017 16:50   Dg Chest 2 View  Result Date: 07/27/2017 CLINICAL DATA:  Lung cancer. Increasing shortness of breath and fever. EXAM: CHEST - 2 VIEW COMPARISON:  Chest 06/21/2017.  CT chest 06/21/2017 FINDINGS: Shallow inspiration. Linear atelectasis in the lung bases. Right suprahilar/paratracheal fullness is unchanged since previous study. This corresponds to medial consolidation on previous CT, likely due to postradiation changes. No acute consolidation or airspace disease is appreciated. There is blunting of the costophrenic angles which may represent small pleural effusions versus pleural thickening. No pneumothorax. Heart size and pulmonary vascularity are normal. IMPRESSION: Chronic right hilar/right paratracheal changes similar to previous study. Shallow inspiration with atelectasis in the lung bases. No significant change. Electronically Signed   By: Lucienne Capers M.D.   On: 07/27/2017 01:32   Ct Angio Chest Pe W Or Wo Contrast  Result Date: 07/27/2017 CLINICAL DATA:  52 year old female with a history of lung cancer and new onset chest pain concerning for pulmonary embolus. EXAM: CT ANGIOGRAPHY CHEST WITH CONTRAST TECHNIQUE: Multidetector CT imaging of the chest was performed using the standard protocol during bolus administration of intravenous contrast. Multiplanar CT image reconstructions and MIPs were obtained to evaluate the vascular anatomy. CONTRAST:  50m ISOVUE-370 IOPAMIDOL (ISOVUE-370) INJECTION 76%  COMPARISON:  Numerous prior CT scans of the chest, most recent prior CT PE study 06/21/2017. FINDINGS: Cardiovascular: Adequate opacification of the pulmonary arteries to the proximal subsegmental level. No evidence of central filling defect to suggest acute pulmonary embolus. Conventional 3 vessel arch anatomy. No evidence of aortic dissection or aneurysm. There is a right IJ approach port catheter. The catheter tip terminates in the right atrium. The heart is normal in size. No pericardial effusion. Mediastinum/Nodes: Stable low right paratracheal nodal mass measuring 2.2 x 2.6 cm (previously 2.0 x 2.5 cm). Slight interval difference may be due to measurement technique rather than true interval growth. There is a large right axillary lymph node measuring 2.7 by 2.9 cm compared to 2.5 x 2.5 cm previously. Unremarkable thoracic esophagus. Lungs/Pleura: Significant atelectasis/scarring and cicatrization of the medial aspect of the right upper and middle lobes consistent with prior radiation changes. This is stable across multiple prior studies. Mild dependent atelectasis is present in both lower lobes, also similar compared to prior studies. Essentially unchanged 6 mm nodule in the left lower lobe dating back to at least 05/11/2017. No pleural effusion or pneumothorax. Upper Abdomen: No acute abnormality. Musculoskeletal: No chest wall abnormality. No acute or significant osseous findings. Review of the MIP images confirms the above findings. IMPRESSION: 1. Negative for acute pulmonary embolus, pneumonia or other acute cardiopulmonary process. 2. Stable to incremental enlargement of the likely metastatic right axillary lymph node which now measures 2.7 x 2.9 cm compared to 2.7 x 2.6 cm on 06/21/2017. No additional evidence of disease progression or other metastatic disease. 3. Stable to incremental enlargement  of low right paratracheal nodal mass. 4. Stable post radiation changes with scarring/fibrosis in the medial  aspects of the right upper and middle lobes confluent with the right hilum. 5. Similar degree of mild bibasilar subsegmental atelectasis compared to prior studies. 6. Stable left lower lobe pulmonary nodule. Electronically Signed   By: Jacqulynn Cadet M.D.   On: 07/27/2017 09:08   Nm Pet Image Restag (ps) Skull Base To Thigh  Result Date: 08/03/2017 CLINICAL DATA:  Subsequent treatment strategy for non-small cell lung cancer. EXAM: NUCLEAR MEDICINE PET SKULL BASE TO THIGH TECHNIQUE: 12.7 mCi F-18 FDG was injected intravenously. Full-ring PET imaging was performed from the skull base to thigh after the radiotracer. CT data was obtained and used for attenuation correction and anatomic localization. Fasting blood glucose: 147 mg/dl COMPARISON:  Multiple prior chest CTs. The most recent is 07/27/2017 FINDINGS: Mediastinal blood pool activity: SUV max 3.53 NECK: No hypermetabolic lymph nodes in the neck. Incidental CT findings: none CHEST: 2.8 cm rounded axillary lymph node is hypermetabolic with SUV max of 38.8. This is consistent with metastatic adenopathy. Small, subcentimeter adjacent lymph nodes are not hypermetabolic. Stable dense radiation changes involving the right paramediastinal lu mild diffuse FDG uptake with a small rounded focus of more significant uptake noted medially and posteriorly. SUV max is 5.87. Findings likely represent residual or recurrent focus of tumor. Basilar scarring changes. No other pulmonary nodules. No pleural effusion. 14.5 mm pretracheal lymph node is hypermetabolic with SUV max of 8.28. Incidental CT findings: none ABDOMEN/PELVIS: No abnormal hypermetabolic activity within the liver, pancreas, adrenal glands, or spleen. No hypermetabolic lymph nodes in the abdomen or pelvis. 5 cm soft tissue mass in the right upper groin area is hypermetabolic with SUV max 00.3. Right common femoral node measuring 13 mm on image number 187 is mildly hypermetabolic with SUV max of 4.91 Incidental  CT findings: none SKELETON: No focal hypermetabolic activity to suggest skeletal metastasis. Incidental CT findings: none IMPRESSION: 1. 2.8 cm right axillary lymph node is markedly hypermetabolic and consistent with metastatic disease. 2. Extensive radiation changes involving the right paramediastinal lung. There is a focus of hypermetabolism in the right lower lobe which is suspicious for residual or recurrent tumor. There is also hypermetabolic mediastinal adenopathy. 3. 5 cm soft tissue mass in the upper right inguinal area is hypermetabolic and consistent with metastatic adenopathy. Electronically Signed   By: Marijo Sanes M.D.   On: 08/03/2017 13:55   Dg Chest Port 1 View  Result Date: 07/29/2017 CLINICAL DATA:  Shortness of breath EXAM: PORTABLE CHEST 1 VIEW COMPARISON:  Chest radiograph and chest CT July 27, 2017 FINDINGS: Port-A-Cath tip is in superior vena cava. No pneumothorax. There is consolidation along the lateral right hemithorax, at least in part due to radiation therapy change. There is probable layering pleural effusion on the right. There is no new opacity. The left lung is clear. Heart size and pulmonary vascularity are normal. There is adenopathy in the abdomen/right paratracheal region, unchanged from 1 day prior. No new lymph node prominence. No bone lesions. IMPRESSION: Persistent scarring and consolidation right medial hemithorax at least in part due to radiation therapy change. There is adenopathy in this area as well. There is layering pleural effusion on the right. No new opacity evident on either side. Stable cardiac silhouette. Port-A-Cath tip in superior vena cava without pneumothorax. Electronically Signed   By: Lowella Grip III M.D.   On: 07/29/2017 08:56   [place] Core Biopsy (lymph Nodes)  Result  Date: 08/16/2017 INDICATION: 52 year old with history of metastatic lung adenocarcinoma. The patient now has enlarged lymph nodes. Request for biopsy of an enlarged right  inguinal lymph node. EXAM: ULTRASOUND-GUIDED CORE BIOPSY OF RIGHT INGUINAL LYMPH NODE MEDICATIONS: None. ANESTHESIA/SEDATION: Moderate (conscious) sedation was employed during this procedure. A total of Versed 1.0 mg and Fentanyl 50 mcg was administered intravenously. Moderate Sedation Time: 17 minutes. The patient's level of consciousness and vital signs were monitored continuously by radiology nursing throughout the procedure under my direct supervision. FLUOROSCOPY TIME:  None COMPLICATIONS: None immediate. PROCEDURE: Informed written consent was obtained from the patient after a thorough discussion of the procedural risks, benefits and alternatives. All questions were addressed. A timeout was performed prior to the initiation of the procedure. Ultrasound demonstrated a large hypoechoic lymph node or mass in the right groin. Right groin was prepped with chlorhexidine and sterile field was created. Skin and soft tissues were anesthetized with 1% lidocaine. Using ultrasound guidance, an 18 gauge core device was directed into this right groin lesion. A total of 6 core biopsies were obtained. Five core biopsies were placed in formalin and one was placed in sterile saline. Bandage placed over the puncture site. FINDINGS: Large hypoechoic lesion in the right groin corresponding with the enlarged lymph node on recent PET-CT. Biopsy needle confirmed within the lesion for the core biopsies. IMPRESSION: Successful ultrasound guided core biopsies of the large right inguinal lymph node. Electronically Signed   By: Markus Daft M.D.   On: 08/16/2017 15:00     ASSESSMENT & PLAN:   52 y.o.  African-American female with history of hypertension, diabetes, asthma, dysuria mass and morbid obesity with    #1 h/o Lung Adenocarcinoma Rt sided atleast Stage IIIB (on diagnosis) with large right paratracheal mass with mediastinal adenopathy that appears to have grown significantly over the last 6-7 months and rt supraclavicular  LN +.  -Noted to have a small mass in the left adrenal on CT but PET/CT neg for metastatic disease. Patient has been a lifelong nonsmoker -MRI of the brain was negative for any metastatic disease. At diagnosis. -High PDL1 expression (90%) on [facility] Neg for EGFR, ALK, ROS-1 and BRAF mutations. -Patient completed her planned definitive chemoradiation with carbo Taxol on 02/13/2016. No prohibitive toxicities other than some grade 1 skin desquamation and some grade 1-2 radiation esophagitis. She has subsequently received 2 out of 2 planned dose of carboplatin + Taxol. -Patient has completed 11 doses of maintenance   -CTA chest 10/29/2016  no evidence of lung cancer progression in the chest. No PE -CT abd/pelvis-10/29/2016  Interval 5.0 x 5.0 x 4.7 cm mass in the right lateral subcutaneous fat at the level of the mid abdomen. This has CT features compatible with a metastasis or primary neoplasm. -Continue IV Nivolumab q2weeks - plz schedule next 4 doses Labs q2weeks RTC with Dr Irene Limbo in 4 weeks. CT C/A/P (05/12/2017): Interval development of right axillary, right external iliac and right inguinal adenopathy. Suspicious for nodal metastasis. 2. Decrease in size of right paratracheal adenopathy. 3. Decrease in size and enhancement associated with right lateral body wall lesion. 4. New small nonspecific pulmonary nodule in the left lower lobe measuring 6 mm. 5. Stable appearance of changes secondary to external beam radiation within the right lung  08/03/17 PET which revealed 2.8 cm right axillary lymph node is markedly hypermetabolic and consistent with metastatic disease. Extensive radiation changes involving the right paramediastinal lung. There is a focus of hypermetabolism in the right lower  lobe which is suspicious for residual or recurrent tumor.   #2 Now with Metastatic lung adenocarcinoma with isolated metastases in the right lateral subcutaneous fat at the level of the midabdomen. she received  maintenance Durvalumab from  05/24/2016 q2weeks, switched to Nivolumab q2weeks on 12/13/16 in the setting of Biopsy proven isolated metastatic disease. Given the tumor is strongly PDL1 positive and patient has only isolated metastatic disease was treated with palliative RT to metastases with significant improvement.  Plan Discussed pt labwork today, 08/22/17; blood counts and chemistries are stable  -Discussed 08/16/17 bx which revealed metastatic poorly differentiated carcinoma, consistent with pt's clinical hx of a primary lung adenocarcinoma.  -foundation one and PDL1 rpt results pending -Lidocaine patch for chest wall pain -The pt has no prohibitive toxicities from continuing Nivolumab at this time.   -Discussed treatment options as continuing immunotherapy with radiation or beginning chemotherapy. -Will refer to radiation if LN become more bothersome.   -Offered a referral to [facility] for a second opinion, pt will wait on this for now. -Clendomycin gel for inguinal rash.  -Will refill Cymbalta, Percocet, Synthroid, Prilsoec today.   #3 h/o radiation pneumonitis - no new symptoms. Does has some cough with exertion but this has improved.  #4 Improving fatigue (grade 1) - improved fatigue with levothyroxine compliance with near normalization of FT4 levels.  - previously given TSH of 12 -- increased Levolthyroxine from 182mg to 1565m  ---will likely need larger dose but in the setting of baseline tachycardia we are gradually increasing dose to avoid ppt cardiac arrhythmias.  -TSH 08/08/17 was at 3.992  #5 normocytic anemia related to chemotherapy -resolving and stable Hg at 9.9  #6 Left chest wall pain due to costochondritis - mx with pain medications. CTA x 3 neg for PE. No significant pain at this time.  #7 Sinus tachycardia - on BB per cards , no PE on CTA x 3 , -Still having sinus tachycardia . Partly from deconditioning and anemia. Seeing Dr. HiDebara Pickettrom cardiology  -Continues to be  on Coreg  #8 neoplasm related pain is currently controlled without significant medications. Clinically likely has sleep apnea . Would need to be careful with sedative medications . 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 -trazodone 50-100m63m HS prn   -continue Nivolumab q2 weeks with labs - please schedule 2 doses -RTC with Dr KaleIrene Limbo4 weeks    All of the patients questions were answered with apparent satisfaction. The patient knows to call the clinic with any problems, questions or concerns.  . The total time spent in the appointment was 20 minutes and more than 50% was on counseling and direct patient cares.   GautSullivan LoneMS ACarrier MillsIVMS SCH [facility]atology/Oncology Physician ConeUlyssesffice):       [phone] cell):  [phone]):           [phone] document serves as a record of services personally performed by GautSullivan Lone. It was created on his behalf by SchuBaldwin Jamaicatrained medical scribe. The creation of this record is based on the scribe's personal observations and the provider's statements to them.   .I have reviewed the above documentation for accuracy and completeness,  and I agree with the above. [name] MD

## 2017-08-22 ENCOUNTER — Inpatient Hospital Stay: Payer: Medicaid Other

## 2017-08-22 ENCOUNTER — Inpatient Hospital Stay (HOSPITAL_BASED_OUTPATIENT_CLINIC_OR_DEPARTMENT_OTHER): Payer: Medicaid Other | Admitting: Hematology

## 2017-08-22 ENCOUNTER — Telehealth: Payer: Self-pay

## 2017-08-22 VITALS — BP 116/80 | HR 101 | Temp 98.5°F | Resp 18 | Ht 69.0 in | Wt 274.6 lb

## 2017-08-22 DIAGNOSIS — C3491 Malignant neoplasm of unspecified part of right bronchus or lung: Secondary | ICD-10-CM

## 2017-08-22 DIAGNOSIS — G629 Polyneuropathy, unspecified: Secondary | ICD-10-CM

## 2017-08-22 DIAGNOSIS — K8020 Calculus of gallbladder without cholecystitis without obstruction: Secondary | ICD-10-CM | POA: Diagnosis not present

## 2017-08-22 DIAGNOSIS — E1140 Type 2 diabetes mellitus with diabetic neuropathy, unspecified: Secondary | ICD-10-CM | POA: Diagnosis not present

## 2017-08-22 DIAGNOSIS — Z79899 Other long term (current) drug therapy: Secondary | ICD-10-CM

## 2017-08-22 DIAGNOSIS — Z794 Long term (current) use of insulin: Secondary | ICD-10-CM

## 2017-08-22 DIAGNOSIS — C792 Secondary malignant neoplasm of skin: Secondary | ICD-10-CM

## 2017-08-22 DIAGNOSIS — Z8701 Personal history of pneumonia (recurrent): Secondary | ICD-10-CM

## 2017-08-22 DIAGNOSIS — G893 Neoplasm related pain (acute) (chronic): Secondary | ICD-10-CM | POA: Diagnosis not present

## 2017-08-22 DIAGNOSIS — C773 Secondary and unspecified malignant neoplasm of axilla and upper limb lymph nodes: Secondary | ICD-10-CM

## 2017-08-22 DIAGNOSIS — M129 Arthropathy, unspecified: Secondary | ICD-10-CM

## 2017-08-22 DIAGNOSIS — Z9221 Personal history of antineoplastic chemotherapy: Secondary | ICD-10-CM

## 2017-08-22 DIAGNOSIS — Z923 Personal history of irradiation: Secondary | ICD-10-CM

## 2017-08-22 DIAGNOSIS — R079 Chest pain, unspecified: Secondary | ICD-10-CM

## 2017-08-22 DIAGNOSIS — Z452 Encounter for adjustment and management of vascular access device: Secondary | ICD-10-CM

## 2017-08-22 DIAGNOSIS — I10 Essential (primary) hypertension: Secondary | ICD-10-CM

## 2017-08-22 DIAGNOSIS — Z95828 Presence of other vascular implants and grafts: Secondary | ICD-10-CM

## 2017-08-22 DIAGNOSIS — Z5112 Encounter for antineoplastic immunotherapy: Secondary | ICD-10-CM | POA: Diagnosis not present

## 2017-08-22 DIAGNOSIS — R000 Tachycardia, unspecified: Secondary | ICD-10-CM

## 2017-08-22 DIAGNOSIS — E7800 Pure hypercholesterolemia, unspecified: Secondary | ICD-10-CM

## 2017-08-22 DIAGNOSIS — G4700 Insomnia, unspecified: Secondary | ICD-10-CM

## 2017-08-22 DIAGNOSIS — R21 Rash and other nonspecific skin eruption: Secondary | ICD-10-CM

## 2017-08-22 DIAGNOSIS — E039 Hypothyroidism, unspecified: Secondary | ICD-10-CM

## 2017-08-22 DIAGNOSIS — R599 Enlarged lymph nodes, unspecified: Secondary | ICD-10-CM

## 2017-08-22 LAB — CMP (CANCER CENTER ONLY)
ALK PHOS: 94 U/L (ref 40–150)
ALT: 13 U/L (ref 0–55)
ANION GAP: 6 (ref 3–11)
AST: 13 U/L (ref 5–34)
Albumin: 3.2 g/dL — ABNORMAL LOW (ref 3.5–5.0)
BILIRUBIN TOTAL: 0.2 mg/dL (ref 0.2–1.2)
BUN: 7 mg/dL (ref 7–26)
CALCIUM: 8.8 mg/dL (ref 8.4–10.4)
CO2: 27 mmol/L (ref 22–29)
CREATININE: 0.74 mg/dL (ref 0.60–1.10)
Chloride: 106 mmol/L (ref 98–109)
Glucose, Bld: 184 mg/dL — ABNORMAL HIGH (ref 70–140)
Potassium: 3.9 mmol/L (ref 3.5–5.1)
SODIUM: 139 mmol/L (ref 136–145)
TOTAL PROTEIN: 7.3 g/dL (ref 6.4–8.3)

## 2017-08-22 LAB — CBC WITH DIFFERENTIAL (CANCER CENTER ONLY)
BASOS ABS: 0 10*3/uL (ref 0.0–0.1)
BASOS PCT: 0 %
EOS ABS: 0.3 10*3/uL (ref 0.0–0.5)
Eosinophils Relative: 8 %
HEMATOCRIT: 30 % — AB (ref 34.8–46.6)
HEMOGLOBIN: 9.7 g/dL — AB (ref 11.6–15.9)
Lymphocytes Relative: 30 %
Lymphs Abs: 1.3 10*3/uL (ref 0.9–3.3)
MCH: 29 pg (ref 25.1–34.0)
MCHC: 32.3 g/dL (ref 31.5–36.0)
MCV: 89.6 fL (ref 79.5–101.0)
Monocytes Absolute: 0.3 10*3/uL (ref 0.1–0.9)
Monocytes Relative: 6 %
NEUTROS ABS: 2.4 10*3/uL (ref 1.5–6.5)
NEUTROS PCT: 56 %
Platelet Count: 238 10*3/uL (ref 145–400)
RBC: 3.35 MIL/uL — ABNORMAL LOW (ref 3.70–5.45)
RDW: 12.7 % (ref 11.2–14.5)
WBC: 4.2 10*3/uL (ref 3.9–10.3)

## 2017-08-22 LAB — RETICULOCYTES
RBC.: 3.35 MIL/uL — ABNORMAL LOW (ref 3.70–5.45)
Retic Count, Absolute: 53.6 10*3/uL (ref 33.7–90.7)
Retic Ct Pct: 1.6 % (ref 0.7–2.1)

## 2017-08-22 MED ORDER — SODIUM CHLORIDE 0.9 % IJ SOLN
10.0000 mL | INTRAMUSCULAR | Status: DC | PRN
  Administered 2017-08-22: 10 mL via INTRAVENOUS
  Filled 2017-08-22: qty 10

## 2017-08-22 MED ORDER — SODIUM CHLORIDE 0.9 % IV SOLN
Freq: Once | INTRAVENOUS | Status: AC
  Administered 2017-08-22: 14:00:00 via INTRAVENOUS

## 2017-08-22 MED ORDER — NIVOLUMAB CHEMO INJECTION 100 MG/10ML
240.0000 mg | Freq: Once | INTRAVENOUS | Status: AC
  Administered 2017-08-22: 240 mg via INTRAVENOUS
  Filled 2017-08-22: qty 24

## 2017-08-22 MED ORDER — HEPARIN SOD (PORK) LOCK FLUSH 100 UNIT/ML IV SOLN
500.0000 [IU] | Freq: Once | INTRAVENOUS | Status: AC | PRN
  Administered 2017-08-22: 500 [IU]
  Filled 2017-08-22: qty 5

## 2017-08-22 MED ORDER — SODIUM CHLORIDE 0.9% FLUSH
10.0000 mL | INTRAVENOUS | Status: DC | PRN
  Administered 2017-08-22: 10 mL
  Filled 2017-08-22: qty 10

## 2017-08-22 NOTE — Patient Instructions (Signed)
[facility] Discharge Instructions for Patients Receiving Chemotherapy  Today you received the following chemotherapy agents: Nivolumab  To help prevent nausea and vomiting after your treatment, we encourage you to take your nausea medication as directed.    If you develop nausea and vomiting that is not controlled by your nausea medication, call the clinic.   BELOW ARE SYMPTOMS THAT SHOULD BE REPORTED IMMEDIATELY:  *FEVER GREATER THAN 100.5 F  *CHILLS WITH OR WITHOUT FEVER  NAUSEA AND VOMITING THAT IS NOT CONTROLLED WITH YOUR NAUSEA MEDICATION  *UNUSUAL SHORTNESS OF BREATH  *UNUSUAL BRUISING OR BLEEDING  TENDERNESS IN MOUTH AND THROAT WITH OR WITHOUT PRESENCE OF ULCERS  *URINARY PROBLEMS  *BOWEL PROBLEMS  UNUSUAL RASH Items with * indicate a potential emergency and should be followed up as soon as possible.  Feel free to call the clinic should you have any questions or concerns. The clinic phone number is [phone].  Please show the CHEMO ALERT CARD at check-in to the Emergency Department and triage nurse.   

## 2017-08-22 NOTE — Telephone Encounter (Signed)
Per 5/20 patient is already scheduled. Printed avs and calender of today visit and upcoming appointment. Per 5/20 los

## 2017-08-31 ENCOUNTER — Ambulatory Visit (INDEPENDENT_AMBULATORY_CARE_PROVIDER_SITE_OTHER): Payer: Medicaid Other | Admitting: Pharmacist Clinician (PhC)/ Clinical Pharmacy Specialist

## 2017-08-31 DIAGNOSIS — I10 Essential (primary) hypertension: Secondary | ICD-10-CM

## 2017-08-31 NOTE — Progress Notes (Signed)
08/31/2017 Marilyn [name] 10-18-1965 [id number]   HPI:  Marilyn [name] is a 52 y.o. female patient of Dr Marilyn [name], with a PMH below who presents today for hypertension clinic evaluation.  She was hospitalized in April for 3 days with ongoing chest pain.  Workup at the hospital showed no cardiac issues and was felt to be musculoskeletal in nature.  She had a follow up appointment with Marilyn [name] about a month later.  Her blood pressure was well controlled at 105/76 and patient noted that she had not yet taken her morning losartan 25 mg dose.   Her medical history is significant for non-ischemic cardiomyopathy, hyperlipidemia, hypertension, DM2, asthma and metastatic lung cancer.  Forgets to take morning medications 1-2 times per week and forgets nighttime meds (mainly takes metoprolol at night) more than she remembers.    Blood Pressure Goal:  130/80  Current Medications:  Metoprolol tartrate 25 mg once daily (usually forgets second dose)  Family Hx:  Father - died in his 34's from multiple issues  Mother - now 9, with hypertension and CHF  One brother with hypertension, one half brother (father) with hypertension  Social Hx:  No tobacco products, no alcohol, drinks caffeine (soda and tea daily, some coffee)  Diet:  Eats out regularly ([employer], fast foods), does not add extra salt; most breakfast a home - eggs (most days), [place] sausage; waffles; does not eat pork or beef  Exercise:  No exercise - is trying to get into physical therapy  Home BP readings:  8 readings over past 2 weeks average 104/74 with range of 98-113/68-79  Intolerances:   nkda  Estimated Creatinine Clearance: 120.3 mL/min (by C-G formula based on SCr of 0.74 mg/dL).  Wt Readings from Last 3 Encounters:  08/22/17 274 lb 9.6 oz (124.6 kg)  08/16/17 275 lb (124.7 kg)  08/15/17 275 lb 6.4 oz (124.9 kg)   BP Readings from Last 3 Encounters:  08/22/17 116/80  08/16/17 124/87  08/15/17 105/76    Pulse Readings from Last 3 Encounters:  08/22/17 (!) 101  08/16/17 (!) 103  08/15/17 (!) 108    Current Outpatient Medications  Medication Sig Dispense Refill  . aspirin EC 81 MG tablet Take 1 tablet (81 mg total) by mouth daily. 60 tablet 2  . clindamycin (CLINDAGEL) 1 % gel Apply topically 2 (two) times daily. To skin lesions in perineum -- folliculitis 30 g 0  . clotrimazole (CLOTRIMAZOLE ANTI-FUNGAL) 1 % cream Apply 1 application topically 2 (two) times daily. 30 g 0  . DULoxetine (CYMBALTA) 60 MG capsule Take 1 capsule (60 mg total) by mouth daily. 30 capsule 2  . insulin aspart (NOVOLOG) 100 UNIT/ML FlexPen As per sliding scale instructions (Patient taking differently: Inject 0-10 Units into the skin 3 (three) times daily. As per sliding scale instructions) 15 mL 2  . Insulin Degludec (TRESIBA FLEXTOUCH) 200 UNIT/ML SOPN Inject 100 Units into the skin daily before breakfast.     . ketoconazole (NIZORAL) 2 % cream Apply to rash once daily. Keep area dry between applications. 60 g 0  . levothyroxine (SYNTHROID, LEVOTHROID) 150 MCG tablet Take 1 tablet (150 mcg total) by mouth daily before breakfast. 30 tablet 2  . lidocaine-prilocaine (EMLA) cream Apply 1 application topically as needed. 30 g 6  . loratadine (CLARITIN) 10 MG tablet Take 10 mg by mouth daily.    . methocarbamol (ROBAXIN) 500 MG tablet Take 1 tablet (500 mg total) by mouth every 8 (eight)  hours as needed for muscle spasms. 8 tablet 0  . metoprolol tartrate (LOPRESSOR) 25 MG tablet Take 1 tablet (25 mg total) by mouth 2 (two) times daily. 60 tablet 0  . miconazole (MICOTIN) 2 % powder Apply topically 2 (two) times daily. Apply to area of rash over the perineum 85 g 1  . naproxen (NAPROSYN) 500 MG tablet Take 1 tablet (500 mg total) by mouth 2 (two) times daily with a meal. 20 tablet 0  . omeprazole (PRILOSEC) 20 MG capsule Take 1 capsule (20 mg total) by mouth daily. 30 capsule 1  . oxyCODONE-acetaminophen (PERCOCET)  5-325 MG tablet Take 1 tablet by mouth every 6 (six) hours as needed for moderate pain or severe pain. 50 tablet 0  . polyethylene glycol (MIRALAX) packet Take 17 g by mouth daily. 30 each 1  . senna-docusate (SENNA S) 8.6-50 MG tablet Take 2 tablets by mouth 2 (two) times daily. May reduce to 2 tab po HS as needed once regular BM estabished (Patient taking differently: Take 2 tablets by mouth 2 (two) times daily as needed (FOR CONSTIPATION). =) 60 tablet 1  . traZODone (DESYREL) 50 MG tablet Take 1-2 tablets (50-100 mg total) by mouth at bedtime as needed for sleep. 60 tablet 0   No current facility-administered medications for this visit.    Facility-Administered Medications Ordered in Other Visits  Medication Dose Route Frequency Provider Last Rate Last Dose  . sodium chloride 0.9 % injection 10 mL  10 mL Intravenous PRN Marilyn Genera, MD   10 mL at 05/24/16 1252    No Known Allergies  Past Medical History:  Diagnosis Date  . Arthritis   . Asthma   . Diabetes mellitus    Type II  . Hemorrhoids   . Hypercholesteremia   . Hypertension   . Hypothyroidism   . Metastatic lung cancer (metastasis from lung to other site) (HCC)    Lung, Mets to skin- right flank. CT angio 2019 suspicious for axillary, external iliac, right inguinal mets  . 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 NICM (nonischemic cardiomyopathy) ([place])    a. mild - EF 45-50% by echo 10/2016 but EF 52% by low risk nuc 11/2016. b. Echo 07/2017 - EF normal, mild AI, unremarkable echo otherwise.  . Obese   . Pneumonia 2017  . Sinus tachycardia    a. dates back to at least 2013, etiology unclear.    Last menstrual period 01/21/2016.  No problem-specific Assessment & Plan notes found for this encounter.   Marilyn [name] PharmD CPP [facility] [address] [place], [place] [address] [phone]

## 2017-08-31 NOTE — Patient Instructions (Signed)
  Your blood pressure today is 102/66   Check your blood pressure at home daily and keep record of the readings.  Take your BP meds as follows:  Continue to take metoprolol tartrate 25 mg twice daily.  Take the second dose as much as you can remember.  Once your bottle is empty we will switch you to metoprolol succinate.  It is a once daily dose of the same medication.  You will just take 50 mg once daily once you get that.    Bring all of your meds, your BP cuff and your record of home blood pressures to your next appointment.  Exercise as you're able, try to walk approximately 30 minutes per day.  Keep salt intake to a minimum, especially watch canned and prepared boxed foods.  Eat more fresh fruits and vegetables and fewer canned items.  Avoid eating in fast food restaurants.    HOW TO TAKE YOUR BLOOD PRESSURE: . Rest 5 minutes before taking your blood pressure. .  Don't smoke or drink caffeinated beverages for at least 30 minutes before. . Take your blood pressure before (not after) you eat. . Sit comfortably with your back supported and both feet on the floor (don't cross your legs). . Elevate your arm to heart level on a table or a desk. . Use the proper sized cuff. It should fit smoothly and snugly around your bare upper arm. There should be enough room to slip a fingertip under the cuff. The bottom edge of the cuff should be 1 inch above the crease of the elbow. . Ideally, take 3 measurements at one sitting and record the average.

## 2017-09-01 MED ORDER — METOPROLOL SUCCINATE ER 50 MG PO TB24: 50.0000 mg | ORAL_TABLET | Freq: Every day | ORAL | 5 refills | Status: DC

## 2017-09-01 NOTE — Assessment & Plan Note (Signed)
Patient currently has well controlled pressure on just metoprolol tartrate 25 mg once daily.  Also has problems with compliance, so not even getting medication daily.  Stressed to patient need to take metoprolol twice daily for maximum cardiac benefit.  Will switch her to the 50 mg succinate formulation.  She will continue with her 25 mg bid until those are gone.   Advised that she continue home BP checks 2-3 times per week and let us know should she have further questions or concerns

## 2017-09-05 ENCOUNTER — Encounter (HOSPITAL_COMMUNITY): Payer: Self-pay | Admitting: Hematology

## 2017-09-05 ENCOUNTER — Inpatient Hospital Stay: Payer: Medicaid Other

## 2017-09-05 ENCOUNTER — Other Ambulatory Visit: Payer: Self-pay | Admitting: Medical

## 2017-09-05 ENCOUNTER — Inpatient Hospital Stay (HOSPITAL_BASED_OUTPATIENT_CLINIC_OR_DEPARTMENT_OTHER): Payer: Medicaid Other | Admitting: Medical

## 2017-09-05 ENCOUNTER — Inpatient Hospital Stay: Payer: Medicaid Other | Attending: Hematology

## 2017-09-05 VITALS — BP 143/88 | HR 115 | Temp 98.2°F | Resp 18

## 2017-09-05 DIAGNOSIS — Z923 Personal history of irradiation: Secondary | ICD-10-CM | POA: Insufficient documentation

## 2017-09-05 DIAGNOSIS — E669 Obesity, unspecified: Secondary | ICD-10-CM

## 2017-09-05 DIAGNOSIS — I429 Cardiomyopathy, unspecified: Secondary | ICD-10-CM

## 2017-09-05 DIAGNOSIS — Z9221 Personal history of antineoplastic chemotherapy: Secondary | ICD-10-CM | POA: Insufficient documentation

## 2017-09-05 DIAGNOSIS — Z95828 Presence of other vascular implants and grafts: Secondary | ICD-10-CM

## 2017-09-05 DIAGNOSIS — Z794 Long term (current) use of insulin: Secondary | ICD-10-CM | POA: Diagnosis not present

## 2017-09-05 DIAGNOSIS — J45909 Unspecified asthma, uncomplicated: Secondary | ICD-10-CM | POA: Diagnosis not present

## 2017-09-05 DIAGNOSIS — M129 Arthropathy, unspecified: Secondary | ICD-10-CM | POA: Insufficient documentation

## 2017-09-05 DIAGNOSIS — T63301A Toxic effect of unspecified spider venom, accidental (unintentional), initial encounter: Secondary | ICD-10-CM | POA: Diagnosis not present

## 2017-09-05 DIAGNOSIS — E039 Hypothyroidism, unspecified: Secondary | ICD-10-CM | POA: Diagnosis not present

## 2017-09-05 DIAGNOSIS — Z5112 Encounter for antineoplastic immunotherapy: Secondary | ICD-10-CM | POA: Insufficient documentation

## 2017-09-05 DIAGNOSIS — Z79899 Other long term (current) drug therapy: Secondary | ICD-10-CM | POA: Insufficient documentation

## 2017-09-05 DIAGNOSIS — C792 Secondary malignant neoplasm of skin: Secondary | ICD-10-CM | POA: Diagnosis not present

## 2017-09-05 DIAGNOSIS — E7800 Pure hypercholesterolemia, unspecified: Secondary | ICD-10-CM

## 2017-09-05 DIAGNOSIS — G893 Neoplasm related pain (acute) (chronic): Secondary | ICD-10-CM | POA: Diagnosis not present

## 2017-09-05 DIAGNOSIS — C3491 Malignant neoplasm of unspecified part of right bronchus or lung: Secondary | ICD-10-CM

## 2017-09-05 DIAGNOSIS — G4700 Insomnia, unspecified: Secondary | ICD-10-CM | POA: Insufficient documentation

## 2017-09-05 DIAGNOSIS — Z452 Encounter for adjustment and management of vascular access device: Secondary | ICD-10-CM

## 2017-09-05 DIAGNOSIS — E1140 Type 2 diabetes mellitus with diabetic neuropathy, unspecified: Secondary | ICD-10-CM

## 2017-09-05 DIAGNOSIS — Z7982 Long term (current) use of aspirin: Secondary | ICD-10-CM | POA: Insufficient documentation

## 2017-09-05 DIAGNOSIS — R000 Tachycardia, unspecified: Secondary | ICD-10-CM | POA: Insufficient documentation

## 2017-09-05 DIAGNOSIS — I10 Essential (primary) hypertension: Secondary | ICD-10-CM | POA: Diagnosis not present

## 2017-09-05 LAB — CBC WITH DIFFERENTIAL (CANCER CENTER ONLY)
BASOS ABS: 0 10*3/uL (ref 0.0–0.1)
Basophils Relative: 0 %
Eosinophils Absolute: 0.3 10*3/uL (ref 0.0–0.5)
Eosinophils Relative: 6 %
HCT: 32 % — ABNORMAL LOW (ref 34.8–46.6)
HEMOGLOBIN: 10.3 g/dL — AB (ref 11.6–15.9)
LYMPHS ABS: 1.1 10*3/uL (ref 0.9–3.3)
LYMPHS PCT: 23 %
MCH: 28.4 pg (ref 25.1–34.0)
MCHC: 32.2 g/dL (ref 31.5–36.0)
MCV: 88.2 fL (ref 79.5–101.0)
Monocytes Absolute: 0.3 10*3/uL (ref 0.1–0.9)
Monocytes Relative: 5 %
NEUTROS PCT: 66 %
Neutro Abs: 3.3 10*3/uL (ref 1.5–6.5)
PLATELETS: 282 10*3/uL (ref 145–400)
RBC: 3.63 MIL/uL — AB (ref 3.70–5.45)
RDW: 12.6 % (ref 11.2–14.5)
WBC: 5 10*3/uL (ref 3.9–10.3)

## 2017-09-05 LAB — RETICULOCYTES
RBC.: 3.63 MIL/uL — ABNORMAL LOW (ref 3.70–5.45)
RETIC CT PCT: 1.7 % (ref 0.7–2.1)
Retic Count, Absolute: 61.7 10*3/uL (ref 33.7–90.7)

## 2017-09-05 LAB — CMP (CANCER CENTER ONLY)
ALT: 11 U/L (ref 0–55)
ANION GAP: 9 (ref 3–11)
AST: 17 U/L (ref 5–34)
Albumin: 3.3 g/dL — ABNORMAL LOW (ref 3.5–5.0)
Alkaline Phosphatase: 105 U/L (ref 40–150)
BILIRUBIN TOTAL: 0.2 mg/dL (ref 0.2–1.2)
BUN: 9 mg/dL (ref 7–26)
CHLORIDE: 104 mmol/L (ref 98–109)
CO2: 27 mmol/L (ref 22–29)
Calcium: 9 mg/dL (ref 8.4–10.4)
Creatinine: 0.77 mg/dL (ref 0.60–1.10)
Glucose, Bld: 210 mg/dL — ABNORMAL HIGH (ref 70–140)
POTASSIUM: 4.2 mmol/L (ref 3.5–5.1)
Sodium: 140 mmol/L (ref 136–145)
TOTAL PROTEIN: 7.7 g/dL (ref 6.4–8.3)

## 2017-09-05 MED ORDER — ALTEPLASE 2 MG IJ SOLR
2.0000 mg | Freq: Once | INTRAMUSCULAR | Status: AC | PRN
  Administered 2017-09-05: 2 mg
  Filled 2017-09-05: qty 2

## 2017-09-05 MED ORDER — MUPIROCIN CALCIUM 2 % EX CREA: 1.0000 "application " | TOPICAL_CREAM | Freq: Two times a day (BID) | CUTANEOUS | 0 refills | Status: DC

## 2017-09-05 MED ORDER — HEPARIN SOD (PORK) LOCK FLUSH 100 UNIT/ML IV SOLN
500.0000 [IU] | Freq: Once | INTRAVENOUS | Status: AC | PRN
  Administered 2017-09-05: 500 [IU]
  Filled 2017-09-05: qty 5

## 2017-09-05 MED ORDER — SODIUM CHLORIDE 0.9% FLUSH
10.0000 mL | INTRAVENOUS | Status: DC | PRN
  Administered 2017-09-05: 10 mL
  Filled 2017-09-05: qty 10

## 2017-09-05 MED ORDER — SODIUM CHLORIDE 0.9 % IV SOLN
Freq: Once | INTRAVENOUS | Status: AC
  Administered 2017-09-05: 10:00:00 via INTRAVENOUS

## 2017-09-05 MED ORDER — ALTEPLASE 2 MG IJ SOLR
INTRAMUSCULAR | Status: AC
  Filled 2017-09-05: qty 2

## 2017-09-05 MED ORDER — SODIUM CHLORIDE 0.9 % IJ SOLN
10.0000 mL | INTRAMUSCULAR | Status: DC | PRN
  Administered 2017-09-05: 10 mL via INTRAVENOUS
  Filled 2017-09-05: qty 10

## 2017-09-05 MED ORDER — SODIUM CHLORIDE 0.9 % IV SOLN
240.0000 mg | Freq: Once | INTRAVENOUS | Status: AC
  Administered 2017-09-05: 240 mg via INTRAVENOUS
  Filled 2017-09-05: qty 24

## 2017-09-05 NOTE — Patient Instructions (Signed)
[facility] Discharge Instructions for Patients Receiving Chemotherapy  Today you received the following chemotherapy agents: Nivolumab  To help prevent nausea and vomiting after your treatment, we encourage you to take your nausea medication as directed.    If you develop nausea and vomiting that is not controlled by your nausea medication, call the clinic.   BELOW ARE SYMPTOMS THAT SHOULD BE REPORTED IMMEDIATELY:  *FEVER GREATER THAN 100.5 F  *CHILLS WITH OR WITHOUT FEVER  NAUSEA AND VOMITING THAT IS NOT CONTROLLED WITH YOUR NAUSEA MEDICATION  *UNUSUAL SHORTNESS OF BREATH  *UNUSUAL BRUISING OR BLEEDING  TENDERNESS IN MOUTH AND THROAT WITH OR WITHOUT PRESENCE OF ULCERS  *URINARY PROBLEMS  *BOWEL PROBLEMS  UNUSUAL RASH Items with * indicate a potential emergency and should be followed up as soon as possible.  Feel free to call the clinic should you have any questions or concerns. The clinic phone number is [phone].  Please show the CHEMO ALERT CARD at check-in to the Emergency Department and triage nurse.   

## 2017-09-06 NOTE — Progress Notes (Signed)
[facility] Progress Note   Marilyn [name] [id number] 08-May-1965 52 y.o.  Marilyn [name] is managed by Dr. Sullivan Lone  Actively treated with chemotherapy/immunotherapy: yes  Current Therapy: Nivolumab  Last Treated: 09/05/2017 (cycle 20, day 1)  Assessment: Plan:    Spider bite wound, accidental or unintentional, initial encounter   Spider bite of the left dorsal forearm: The patient was given a prescription for Bactroban with instructions to use twice daily.  Please see After Visit Summary for patient specific instructions.  Future Appointments  Date Time Provider [place]  09/19/2017  8:00 AM [facility] ONLY [facility] None  09/19/2017  8:15 AM [facility] INJ NURSE [facility] None  09/19/2017  8:40 AM [name], MD [facility] None  09/19/2017  9:30 AM [facility] E16 [facility] None  09/20/2017 10:00 AM Lendon Colonel, NP [facility]  10/03/2017  8:00 AM [facility] LAB 6 [facility] None  10/03/2017  8:15 AM [facility] INJ NURSE [facility] None  10/03/2017  9:00 AM [facility] A3 [facility] None  10/17/2017  8:30 AM [facility] LAB 1 [facility] None  10/17/2017  8:45 AM [facility] FLUSH NURSE 2 [facility] None  10/17/2017  9:20 AM [name], MD [facility] None  10/17/2017 10:15 AM [facility] F21 [facility] None    No orders of the defined types were placed in this encounter.      Subjective:   Patient ID:  Marilyn [name] is a 52 y.o. (DOB 02/26/1966) female.  Chief Complaint: No chief complaint on file.   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 Sullivan Lone and presents for cycle 20, day 1 of nivolumab today.  The patient requested to be seen in the infusion room.  She reported having a suspected spider bite over her left dorsal forearm.  She first noticed this around 1 week ago.  The area is indurated, hyperpigmented  and has skin peeling at the edge of the wound.  She denies fevers, chills, sweats, or purulent discharge from the area.  She has not sought medical attention to date.  Medications: I have reviewed the patient's current medications.  Allergies: No Known Allergies  Past Medical History:  Diagnosis Date  . Arthritis   . Asthma   . Diabetes mellitus    Type II  . Hemorrhoids   . Hypercholesteremia   . Hypertension   . Hypothyroidism   . Metastatic lung cancer (metastasis from lung to other site) (HCC)    Lung, Mets to skin- right flank. CT angio 2019 suspicious for axillary, external iliac, right inguinal mets  . 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 NICM (nonischemic cardiomyopathy) ([place])    a. mild - EF 45-50% by echo 10/2016 but EF 52% by low risk nuc 11/2016. b. Echo 07/2017 - EF normal, mild AI, unremarkable echo otherwise.  . Obese   . Pneumonia 2017  . Sinus tachycardia    a. dates back to at least 2013, etiology unclear.    Past Surgical History:  Procedure Laterality Date  . CESAREAN SECTION    . COLONOSCOPY    . I/D  arm Right 2013  . IR FLUORO GUIDE PORT INSERTION RIGHT  02/18/2017  . IR GENERIC HISTORICAL  01/08/2016   IR US GUIDE VASC ACCESS RIGHT 01/08/2016 [name], DO [facility]  . IR GENERIC HISTORICAL  01/08/2016   IR FLUORO GUIDE CV  LINE RIGHT 01/08/2016 [name], DO [facility]  . IR US GUIDE VASC ACCESS RIGHT  02/18/2017  . MULTIPLE EXTRACTIONS WITH ALVEOLOPLASTY N/A 03/25/2017   Procedure: MULTIPLE EXTRACTION WITH ALVEOLOPLASTY (teeth #s four, six, seven, eight, nine, ten, eleven, one, twenty-three, twenty-four, twenty-five, twenty-six, twenty-nine, thirty);  Surgeon: [name], [name];  Location: [place];  Service: Oral Surgery;  Laterality: N/A;  . TUBAL LIGATION    . US guided core needle biopsy     of right lower neck/supraclavicular lymph nodes    Family History  Problem Relation Age of Onset  . Heart failure Mother   .  Hypertension Mother   . Cancer Neg Hx   . Rheumatologic disease Neg Hx     Social History   Socioeconomic History  . Marital status: Divorced    Spouse name: Not on file  . Number of children: Not on file  . Years of education: Not on file  . Highest education level: Not on file  Occupational History  . Not on file  Social Needs  . Financial resource strain: Not on file  . Food insecurity:    Worry: Not on file    Inability: Not on file  . Transportation needs:    Medical: Not on file    Non-medical: Not on file  Tobacco Use  . Smoking status: Never Smoker  . Smokeless tobacco: Never Used  Substance and Sexual Activity  . Alcohol use: No  . Drug use: No  . Sexual activity: Not Currently  Lifestyle  . Physical activity:    Days per week: Not on file    Minutes per session: Not on file  . Stress: Not on file  Relationships  . Social connections:    Talks on phone: Not on file    Gets together: Not on file    Attends religious service: Not on file    Active member of club or organization: Not on file    Attends meetings of clubs or organizations: Not on file    Relationship status: Not on file  . Intimate partner violence:    Fear of current or ex partner: Not on file    Emotionally abused: Not on file    Physically abused: Not on file    Forced sexual activity: Not on file  Other Topics Concern  . Not on file  Social History Narrative   No bird or mold exposure. No recent travel.    Past Medical History, Surgical history, Social history, and Family history were reviewed and updated as appropriate.   Please see review of systems for further details on the patient's review from today.   Review of Systems:  Review of Systems  Constitutional: Negative for chills, diaphoresis and fever.  Skin: Positive for color change and wound.    Objective:   Physical Exam:  LMP 01/21/2016 Comment: pt signed preg test waiver 04/16/16  ECOG: 0  Physical Exam    Constitutional: No distress.  HENT:  Head: Normocephalic and atraumatic.  Skin: Skin is warm and dry. She is not diaphoretic.  2 x 2 centimeter area of induration with scaling of the skin around the periphery of the left dorsal forearm.  There is a slightly raised area induration and tenderness with a central wound noted.      Lab Review:     Component Value Date/Time   NA 140 09/05/2017 0754   NA 144 04/04/2017 0940   K 4.2 09/05/2017 0754   K 3.6  04/04/2017 0940   CL 104 09/05/2017 0754   CO2 27 09/05/2017 0754   CO2 29 04/04/2017 0940   GLUCOSE 210 (H) 09/05/2017 0754   GLUCOSE 111 04/04/2017 0940   BUN 9 09/05/2017 0754   BUN 4.9 (L) 04/04/2017 0940   CREATININE 0.77 09/05/2017 0754   CREATININE 0.8 04/04/2017 0940   CALCIUM 9.0 09/05/2017 0754   CALCIUM 8.8 04/04/2017 0940   PROT 7.7 09/05/2017 0754   PROT 7.1 04/04/2017 0940   ALBUMIN 3.3 (L) 09/05/2017 0754   ALBUMIN 3.0 (L) 04/04/2017 0940   AST 17 09/05/2017 0754   AST 13 04/04/2017 0940   ALT 11 09/05/2017 0754   ALT 10 04/04/2017 0940   ALKPHOS 105 09/05/2017 0754   ALKPHOS 96 04/04/2017 0940   BILITOT 0.2 09/05/2017 0754   BILITOT 0.36 04/04/2017 0940   GFRNONAA >60 09/05/2017 0754   GFRAA >60 09/05/2017 0754       Component Value Date/Time   WBC 5.0 09/05/2017 0754   WBC 4.5 08/16/2017 1115   RBC 3.63 (L) 09/05/2017 0754   RBC 3.63 (L) 09/05/2017 0754   HGB 10.3 (L) 09/05/2017 0754   HGB 9.5 (L) 04/04/2017 0940   HCT 32.0 (L) 09/05/2017 0754   HCT 29.8 (L) 04/04/2017 0940   PLT 282 09/05/2017 0754   PLT 267 04/04/2017 0940   MCV 88.2 09/05/2017 0754   MCV 88.7 04/04/2017 0940   MCH 28.4 09/05/2017 0754   MCHC 32.2 09/05/2017 0754   RDW 12.6 09/05/2017 0754   RDW 12.8 04/04/2017 0940   LYMPHSABS 1.1 09/05/2017 0754   LYMPHSABS 1.1 04/04/2017 0940   MONOABS 0.3 09/05/2017 0754   MONOABS 0.4 04/04/2017 0940   EOSABS 0.3 09/05/2017 0754   EOSABS 0.3 04/04/2017 0940   BASOSABS 0.0  09/05/2017 0754   BASOSABS 0.0 04/04/2017 0940   -------------------------------  Imaging from last 24 hours (if applicable):  Radiology interpretation: [place] Core Biopsy (lymph Nodes)  Result Date: 08/16/2017 INDICATION: 52 year old with history of metastatic lung adenocarcinoma. The patient now has enlarged lymph nodes. Request for biopsy of an enlarged right inguinal lymph node. EXAM: ULTRASOUND-GUIDED CORE BIOPSY OF RIGHT INGUINAL LYMPH NODE MEDICATIONS: None. ANESTHESIA/SEDATION: Moderate (conscious) sedation was employed during this procedure. A total of Versed 1.0 mg and Fentanyl 50 mcg was administered intravenously. Moderate Sedation Time: 17 minutes. The patient's level of consciousness and vital signs were monitored continuously by radiology nursing throughout the procedure under my direct supervision. FLUOROSCOPY TIME:  None COMPLICATIONS: None immediate. PROCEDURE: Informed written consent was obtained from the patient after a thorough discussion of the procedural risks, benefits and alternatives. All questions were addressed. A timeout was performed prior to the initiation of the procedure. Ultrasound demonstrated a large hypoechoic lymph node or mass in the right groin. Right groin was prepped with chlorhexidine and sterile field was created. Skin and soft tissues were anesthetized with 1% lidocaine. Using ultrasound guidance, an 18 gauge core device was directed into this right groin lesion. A total of 6 core biopsies were obtained. Five core biopsies were placed in formalin and one was placed in sterile saline. Bandage placed over the puncture site. FINDINGS: Large hypoechoic lesion in the right groin corresponding with the enlarged lymph node on recent PET-CT. Biopsy needle confirmed within the lesion for the core biopsies. IMPRESSION: Successful ultrasound guided core biopsies of the large right inguinal lymph node. Electronically Signed   By: Markus Daft M.D.   On: 08/16/2017 15:00

## 2017-09-07 ENCOUNTER — Other Ambulatory Visit: Payer: Self-pay | Admitting: Hematology

## 2017-09-07 ENCOUNTER — Other Ambulatory Visit: Payer: Self-pay | Admitting: Nurse Practitioner

## 2017-09-07 ENCOUNTER — Other Ambulatory Visit: Payer: Self-pay

## 2017-09-07 DIAGNOSIS — Z1231 Encounter for screening mammogram for malignant neoplasm of breast: Secondary | ICD-10-CM

## 2017-09-07 MED ORDER — MUPIROCIN 2 % EX OINT: 1.0000 "application " | TOPICAL_OINTMENT | Freq: Two times a day (BID) | CUTANEOUS | 1 refills | Status: DC

## 2017-09-07 NOTE — Progress Notes (Signed)
[email or web address]

## 2017-09-16 ENCOUNTER — Other Ambulatory Visit: Payer: Self-pay | Admitting: Nurse Practitioner

## 2017-09-16 DIAGNOSIS — N644 Mastodynia: Secondary | ICD-10-CM

## 2017-09-16 NOTE — Progress Notes (Signed)
[name]    HEMATOLOGY/ONCOLOGY CLINIC NOTE  Date of Service: 09/19/17   Patient Care Team: [name], FNP as PCP - General (Nurse Practitioner) Pixie Casino, MD as PCP - Cardiology (Cardiology)  CHIEF COMPLAINTS  F/u for continued management of metastatic lung adenocarcinoma  Diagnosis Metastatic Lung Adenocarcinoma with rt flank subcutaneous metastasis.  Current treatment:  Nivolumab q2 weeks  PreviousTreatment -Concurrent Chemo-radiation with Carboplatin/Taxol. Completed RT on 02/13/2016 and has then completed 2 cycles of carboplatin + taxol. -Started Maintenance Durvalumab from  05/24/2016 q2weeks, switched to Nivolumab q2weeks on 12/13/16 once metastatic disease noted -s/p palliative RT to rt sided Subcutaneous metastatic mass.    HISTORY OF PRESENTING ILLNESS: Plz see my previous note for details on initial presentation.  INTERVAL HISTORY   Marilyn [name] presents to the office today for followup of her metastatic lung cancer. The patient's last visit with [place] was on 08/22/17. She is accompanied today by her mother. The pt reports that she is doing well overall.   The pt reports that she will be having a mammogram, pap smear, urine check, and a tetanus shot on 09/23/17.   She notes that her axillary and inguinal lymph nodes have not bothered her and remain stable.   Lab results today (09/19/17) of CBC, CMP, and Reticulocytes is as follows: all values are WNL except for RBC at 3.62, HGB at 10.3, HCT at 31.5, Glucose at 362 and Albumin at 3.1.  On review of systems, pt reports stable energy, stable lymph nodes, resolving abdominal tenderness, and denies painful lymph nodes, any other symptoms.    MEDICAL HISTORY:  Past Medical History:  Diagnosis Date  . Arthritis   . Asthma   . Diabetes mellitus    Type II  . Hemorrhoids   . Hypercholesteremia   . Hypertension   . Hypothyroidism   . Metastatic lung cancer (metastasis from lung to other site) (HCC)    Lung,  Mets to skin- right flank. CT angio 2019 suspicious for axillary, external iliac, right inguinal mets  . 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 NICM (nonischemic cardiomyopathy) ([place])    a. mild - EF 45-50% by echo 10/2016 but EF 52% by low risk nuc 11/2016. b. Echo 07/2017 - EF normal, mild AI, unremarkable echo otherwise.  . Obese   . Pneumonia 2017  . Sinus tachycardia    a. dates back to at least 2013, etiology unclear.    SURGICAL HISTORY: Past Surgical History:  Procedure Laterality Date  . CESAREAN SECTION    . COLONOSCOPY    . I/D  arm Right 2013  . IR FLUORO GUIDE PORT INSERTION RIGHT  02/18/2017  . IR GENERIC HISTORICAL  01/08/2016   IR US GUIDE VASC ACCESS RIGHT 01/08/2016 [name], DO [facility]  . IR GENERIC HISTORICAL  01/08/2016   IR FLUORO GUIDE CV LINE RIGHT 01/08/2016 [name], DO [facility]  . IR US GUIDE VASC ACCESS RIGHT  02/18/2017  . MULTIPLE EXTRACTIONS WITH ALVEOLOPLASTY N/A 03/25/2017   Procedure: MULTIPLE EXTRACTION WITH ALVEOLOPLASTY (teeth #s four, six, seven, eight, nine, ten, eleven, one, twenty-three, twenty-four, twenty-five, twenty-six, twenty-nine, thirty);  Surgeon: [name], [name];  Location: [place] [facility];  Service: Oral Surgery;  Laterality: N/A;  . TUBAL LIGATION    . US guided core needle biopsy     of right lower neck/supraclavicular lymph nodes    SOCIAL HISTORY: Social History   Socioeconomic History  . Marital status:  Divorced    Spouse name: Not on file  . Number of children: Not on file  . Years of education: Not on file  . Highest education level: Not on file  Occupational History  . Not on file  Social Needs  . Financial resource strain: Not on file  . Food insecurity:    Worry: Not on file    Inability: Not on file  . Transportation needs:    Medical: Not on file    Non-medical: Not on file  Tobacco Use  . Smoking status: Never Smoker  . Smokeless tobacco: Never Used  Substance and Sexual  Activity  . Alcohol use: No  . Drug use: No  . Sexual activity: Not Currently  Lifestyle  . Physical activity:    Days per week: Not on file    Minutes per session: Not on file  . Stress: Not on file  Relationships  . Social connections:    Talks on phone: Not on file    Gets together: Not on file    Attends religious service: Not on file    Active member of club or organization: Not on file    Attends meetings of clubs or organizations: Not on file    Relationship status: Not on file  . Intimate partner violence:    Fear of current or ex partner: Not on file    Emotionally abused: Not on file    Physically abused: Not on file    Forced sexual activity: Not on file  Other Topics Concern  . Not on file  Social History Narrative   No bird or mold exposure. No recent travel.    FAMILY HISTORY: Family History  Problem Relation Age of Onset  . Heart failure Mother   . Hypertension Mother   . Cancer Neg Hx   . Rheumatologic disease Neg Hx     ALLERGIES:  has No Known Allergies.  MEDICATIONS:  Current Outpatient Medications  Medication Sig Dispense Refill  . aspirin EC 81 MG tablet Take 1 tablet (81 mg total) by mouth daily. 60 tablet 2  . clindamycin (CLINDAGEL) 1 % gel Apply topically 2 (two) times daily. To skin lesions in perineum -- folliculitis 30 g 0  . clotrimazole (CLOTRIMAZOLE ANTI-FUNGAL) 1 % cream Apply 1 application topically 2 (two) times daily. 30 g 0  . DULoxetine (CYMBALTA) 60 MG capsule Take 1 capsule (60 mg total) by mouth daily. 30 capsule 2  . insulin aspart (NOVOLOG) 100 UNIT/ML FlexPen As per sliding scale instructions (Patient taking differently: Inject 0-10 Units into the skin 3 (three) times daily. As per sliding scale instructions) 15 mL 2  . Insulin Degludec (TRESIBA FLEXTOUCH) 200 UNIT/ML SOPN Inject 100 Units into the skin daily before breakfast.     . ketoconazole (NIZORAL) 2 % cream Apply to rash once daily. Keep area dry between applications.  60 g 0  . levothyroxine (SYNTHROID, LEVOTHROID) 150 MCG tablet Take 1 tablet (150 mcg total) by mouth daily before breakfast. 30 tablet 2  . lidocaine-prilocaine (EMLA) cream Apply 1 application topically as needed. 30 g 6  . loratadine (CLARITIN) 10 MG tablet Take 10 mg by mouth daily.    . methocarbamol (ROBAXIN) 500 MG tablet Take 1 tablet (500 mg total) by mouth every 8 (eight) hours as needed for muscle spasms. 8 tablet 0  . metoprolol succinate (TOPROL-XL) 50 MG 24 hr tablet Take 1 tablet (50 mg total) by mouth daily. Take with or immediately following a  meal. 30 tablet 5  . miconazole (MICOTIN) 2 % powder Apply topically 2 (two) times daily. Apply to area of rash over the perineum 85 g 1  . mupirocin cream (BACTROBAN) 2 % Apply 1 application topically 2 (two) times daily. 30 g 0  . mupirocin ointment (BACTROBAN) 2 % Place 1 application into the nose 2 (two) times daily. 22 g 1  . naproxen (NAPROSYN) 500 MG tablet Take 1 tablet (500 mg total) by mouth 2 (two) times daily with a meal. 20 tablet 0  . omeprazole (PRILOSEC) 20 MG capsule Take 1 capsule (20 mg total) by mouth daily. 30 capsule 1  . oxyCODONE-acetaminophen (PERCOCET) 5-325 MG tablet Take 1 tablet by mouth every 6 (six) hours as needed for moderate pain or severe pain. 50 tablet 0  . polyethylene glycol (MIRALAX) packet Take 17 g by mouth daily. 30 each 1  . senna-docusate (SENNA S) 8.6-50 MG tablet Take 2 tablets by mouth 2 (two) times daily. May reduce to 2 tab po HS as needed once regular BM estabished (Patient taking differently: Take 2 tablets by mouth 2 (two) times daily as needed (FOR CONSTIPATION). =) 60 tablet 1  . traZODone (DESYREL) 50 MG tablet Take 1-2 tablets (50-100 mg total) by mouth at bedtime as needed for sleep. 60 tablet 0   No current facility-administered medications for this visit.    Facility-Administered Medications Ordered in Other Visits  Medication Dose Route Frequency Provider Last Rate Last Dose  .  sodium chloride 0.9 % injection 10 mL  10 mL Intravenous PRN [name], MD   10 mL at 05/24/16 1252    REVIEW OF SYSTEMS:   A 10+ POINT REVIEW OF SYSTEMS WAS OBTAINED including neurology, dermatology, psychiatry, cardiac, respiratory, lymph, extremities, GI, GU, Musculoskeletal, constitutional, breasts, reproductive, HEENT.  All pertinent positives are noted in the HPI.  All others are negative.   PHYSICAL EXAMINATION: ECOG PERFORMANCE STATUS: 1 - Symptomatic but completely ambulatory  Vitals:   09/19/17 0841  BP: 127/78  Pulse: (!) 121  Resp: 18  Temp: 98.8 F (37.1 C)  SpO2: 98%   Filed Weights   09/19/17 0841  Weight: 272 lb 12.8 oz (123.7 kg)   .Body mass index is 40.29 kg/m.  GENERAL:alert, in no acute distress and comfortable SKIN: no acute rashes, no significant lesions EYES: conjunctiva are pink and non-injected, sclera anicteric OROPHARYNX: MMM, no exudates, no oropharyngeal erythema or ulceration NECK: supple, no JVD LYMPH:  no palpable lymphadenopathy in the cervical, axillary or inguinal regions LUNGS: clear to auscultation b/l with normal respiratory effort HEART: regular rate & rhythm ABDOMEN:  normoactive bowel sounds , non tender, not distended. Extremity: no pedal edema PSYCH: alert & oriented x 3 with fluent speech NEURO: no focal motor/sensory deficits    LABORATORY DATA: .  [name] CBC Latest Ref Rng & Units 09/19/2017 09/05/2017 08/22/2017  WBC 3.9 - 10.3 K/uL 5.4 5.0 4.2  Hemoglobin 11.6 - 15.9 g/dL 10.3(L) 10.3(L) 9.7(L)  Hematocrit 34.8 - 46.6 % 31.5(L) 32.0(L) 30.0(L)  Platelets 145 - 400 K/uL 272 282 238  HGB 9.5  . CMP Latest Ref Rng & Units 09/19/2017 09/05/2017 08/22/2017  Glucose 70 - 140 mg/dL 362(H) 210(H) 184(H)  BUN 7 - 26 mg/dL _0 Creatinine 0.60 - 1.10 mg/dL 0.80 0.77 0.74  Sodium 136 - 145 mmol/L 137 140 139  Potassium 3.5 - 5.1 mmol/L 4.1 4.2 3.9  Chloride 98 - 109 mmol/L 102 104 106  CO2  22 - 29 mmol/L _0 Calcium 8.4 - 10.4 mg/dL 9.0 9.0 8.8  Total Protein 6.4 - 8.3 g/dL 7.3 7.7 7.3  Total Bilirubin 0.2 - 1.2 mg/dL 0.2 0.2 0.2  Alkaline Phos 40 - 150 U/L 101 105 94  AST 5 - 34 U/L _1 ALT 0 - 55 U/L _2 Component     Latest Ref Rng & Units 08/08/2017  TSH     0.308 - 3.960 uIU/mL 3.992 (H)              08/18/17 Bx:   08/16/17 Molecular Pathology:    RADIOGRAPHIC STUDIES:  .No results found.   ASSESSMENT & PLAN:   52 y.o.  African-American female with history of hypertension, diabetes, asthma, dysuria mass and morbid obesity with    #1 h/o Lung Adenocarcinoma Rt sided atleast Stage IIIB (on diagnosis) with large right paratracheal mass with mediastinal adenopathy that appears to have grown significantly over the last 6-7 months and rt supraclavicular LN +.  -Noted to have a small mass in the left adrenal on CT but PET/CT neg for metastatic disease. Patient has been a lifelong nonsmoker -MRI of the brain was negative for any metastatic disease. At diagnosis. -High PDL1 expression (90%) on foundation One Neg for EGFR, ALK, ROS-1 and BRAF mutations. -Patient completed her planned definitive chemoradiation with Carbo Taxol on 02/13/2016. No prohibitive toxicities other than some grade 1 skin desquamation and some grade 1-2 radiation esophagitis. She has subsequently received 2 out of 2 planned dose of carboplatin + Taxol. -Patient has completed 11 doses of maintenance   -CTA chest 10/29/2016  no evidence of lung cancer progression in the chest. No PE -CT abd/pelvis-10/29/2016  Interval 5.0 x 5.0 x 4.7 cm mass in the right lateral subcutaneous fat at the level of the mid abdomen. This has CT features compatible with a metastasis or primary neoplasm. -CT C/A/P (05/12/2017): Interval development of right axillary, right external iliac and right inguinal adenopathy. Suspicious for nodal metastasis. 2. Decrease in size of right paratracheal adenopathy. 3. Decrease in  size and enhancement associated with right lateral body wall lesion. 4. New small nonspecific pulmonary nodule in the left lower lobe measuring 6 mm. 5. Stable appearance of changes secondary to external beam radiation within the right lung  08/03/17 PET which revealed 2.8 cm right axillary lymph node is markedly hypermetabolic and consistent with metastatic disease. Extensive radiation changes involving the right paramediastinal lung. There is a focus of hypermetabolism in the right lower lobe which is suspicious for residual or recurrent tumor.   #2 Now with Metastatic lung adenocarcinoma with isolated metastases in the right lateral subcutaneous fat at the level of the midabdomen. she received maintenance Durvalumab from  05/24/2016 q2weeks, switched to Nivolumab q2weeks on 12/13/16 in the setting of Biopsy proven isolated metastatic disease. Given the tumor is strongly PDL1 positive and patient has only isolated metastatic disease was treated with palliative RT to metastases with significant improvement.  Plan Discussed pt labwork today, 09/19/17; blood counts are stable -Molecular pathology from 515/19 did not reveal obviously targetable mutations with NF1 and TP53 mutations present.   -The pt has no prohibitive toxicities from continuing Nivolumab at this time.   -no clinical symptoms of progression at this time.  -Discussed treatment options as continuing immunotherapy with radiation or beginning chemotherapy. -Will refer to radiation if LN or other metastasis becomes more bothersome.   -Offered a referral to  [facility] for a second opinion, pt will wait on this for now.  #3 h/o radiation pneumonitis - no new symptoms. Does has some cough with exertion but this has improved.  #4 Improving fatigue (grade 1) - improved fatigue with levothyroxine compliance with near normalization of FT4 levels.  - previously given TSH of 12 -- increased Levolthyroxine from 196mg to 1551m  ---will likely need  larger dose but in the setting of baseline tachycardia we are gradually increasing dose to avoid ppt cardiac arrhythmias.  -TSH 08/08/17 was at 3.992  #5 normocytic anemia related to chemotherapy -resolving and stable Hg at 9.9  #6 Left chest wall pain due to costochondritis - mx with pain medications. CTA x 3 neg for PE. No significant pain at this time.  #7 Sinus tachycardia - on BB per cards , no PE on CTA x 3 , -Still having sinus tachycardia . Partly from deconditioning and anemia. Seeing Dr. HiDebara Pickettrom cardiology  -Continues to be on Coreg  #8 neoplasm related pain is currently controlled without significant medications. Clinically likely has sleep apnea . Would need to be careful with sedative medications . 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 -trazodone 50-100m56m HS prn   Continue Nivolumab q2weeks as per orders with labs RTC with Dr KaleIrene Limbo4 weeks      All of the patients questions were answered with apparent satisfaction. The patient knows to call the clinic with any problems, questions or concerns.  The toal time spent in the appt was 20 minutes and more than 50% was on counseling and direct patient cares.    GautSullivan LoneMS ABicknellIVMS SCH [facility] Hospital[facility]/Oncology Physician [facility]ice):       [phone] cell):  [phone]):           [phone] SchuBaldwin [place] acting as a scribe for Dr KaleIrene Limbo.I have reviewed the above documentation for accuracy and completeness, and I agree with the above. .GauBrunetta Genera

## 2017-09-19 ENCOUNTER — Inpatient Hospital Stay: Payer: Medicaid Other

## 2017-09-19 ENCOUNTER — Inpatient Hospital Stay (HOSPITAL_BASED_OUTPATIENT_CLINIC_OR_DEPARTMENT_OTHER): Payer: Medicaid Other | Admitting: Hematology

## 2017-09-19 VITALS — BP 127/78 | HR 121 | Temp 98.8°F | Resp 18 | Ht 69.0 in | Wt 272.8 lb

## 2017-09-19 DIAGNOSIS — E039 Hypothyroidism, unspecified: Secondary | ICD-10-CM

## 2017-09-19 DIAGNOSIS — Z5112 Encounter for antineoplastic immunotherapy: Secondary | ICD-10-CM | POA: Diagnosis not present

## 2017-09-19 DIAGNOSIS — J45909 Unspecified asthma, uncomplicated: Secondary | ICD-10-CM | POA: Diagnosis not present

## 2017-09-19 DIAGNOSIS — Z79899 Other long term (current) drug therapy: Secondary | ICD-10-CM

## 2017-09-19 DIAGNOSIS — E669 Obesity, unspecified: Secondary | ICD-10-CM

## 2017-09-19 DIAGNOSIS — C792 Secondary malignant neoplasm of skin: Secondary | ICD-10-CM

## 2017-09-19 DIAGNOSIS — I10 Essential (primary) hypertension: Secondary | ICD-10-CM | POA: Diagnosis not present

## 2017-09-19 DIAGNOSIS — Z95828 Presence of other vascular implants and grafts: Secondary | ICD-10-CM

## 2017-09-19 DIAGNOSIS — C3491 Malignant neoplasm of unspecified part of right bronchus or lung: Secondary | ICD-10-CM

## 2017-09-19 DIAGNOSIS — G4700 Insomnia, unspecified: Secondary | ICD-10-CM

## 2017-09-19 DIAGNOSIS — E7800 Pure hypercholesterolemia, unspecified: Secondary | ICD-10-CM | POA: Diagnosis not present

## 2017-09-19 DIAGNOSIS — Z452 Encounter for adjustment and management of vascular access device: Secondary | ICD-10-CM

## 2017-09-19 DIAGNOSIS — G893 Neoplasm related pain (acute) (chronic): Secondary | ICD-10-CM | POA: Diagnosis not present

## 2017-09-19 DIAGNOSIS — I429 Cardiomyopathy, unspecified: Secondary | ICD-10-CM

## 2017-09-19 DIAGNOSIS — M129 Arthropathy, unspecified: Secondary | ICD-10-CM | POA: Diagnosis not present

## 2017-09-19 DIAGNOSIS — Z923 Personal history of irradiation: Secondary | ICD-10-CM

## 2017-09-19 DIAGNOSIS — Z9221 Personal history of antineoplastic chemotherapy: Secondary | ICD-10-CM

## 2017-09-19 DIAGNOSIS — R000 Tachycardia, unspecified: Secondary | ICD-10-CM

## 2017-09-19 DIAGNOSIS — E1140 Type 2 diabetes mellitus with diabetic neuropathy, unspecified: Secondary | ICD-10-CM

## 2017-09-19 DIAGNOSIS — Z794 Long term (current) use of insulin: Secondary | ICD-10-CM

## 2017-09-19 DIAGNOSIS — Z7982 Long term (current) use of aspirin: Secondary | ICD-10-CM

## 2017-09-19 LAB — COMPREHENSIVE METABOLIC PANEL
ALT: 8 U/L (ref 0–55)
ANION GAP: 9 (ref 3–11)
AST: 12 U/L (ref 5–34)
Albumin: 3.1 g/dL — ABNORMAL LOW (ref 3.5–5.0)
Alkaline Phosphatase: 101 U/L (ref 40–150)
BILIRUBIN TOTAL: 0.2 mg/dL (ref 0.2–1.2)
BUN: 12 mg/dL (ref 7–26)
CHLORIDE: 102 mmol/L (ref 98–109)
CO2: 26 mmol/L (ref 22–29)
Calcium: 9 mg/dL (ref 8.4–10.4)
Creatinine, Ser: 0.8 mg/dL (ref 0.60–1.10)
Glucose, Bld: 362 mg/dL — ABNORMAL HIGH (ref 70–140)
POTASSIUM: 4.1 mmol/L (ref 3.5–5.1)
Sodium: 137 mmol/L (ref 136–145)
TOTAL PROTEIN: 7.3 g/dL (ref 6.4–8.3)

## 2017-09-19 LAB — CBC WITH DIFFERENTIAL (CANCER CENTER ONLY)
BASOS ABS: 0 10*3/uL (ref 0.0–0.1)
Basophils Relative: 0 %
EOS PCT: 5 %
Eosinophils Absolute: 0.3 10*3/uL (ref 0.0–0.5)
HCT: 31.5 % — ABNORMAL LOW (ref 34.8–46.6)
HEMOGLOBIN: 10.3 g/dL — AB (ref 11.6–15.9)
LYMPHS ABS: 1.1 10*3/uL (ref 0.9–3.3)
LYMPHS PCT: 20 %
MCH: 28.5 pg (ref 25.1–34.0)
MCHC: 32.7 g/dL (ref 31.5–36.0)
MCV: 87 fL (ref 79.5–101.0)
Monocytes Absolute: 0.4 10*3/uL (ref 0.1–0.9)
Monocytes Relative: 7 %
NEUTROS PCT: 68 %
Neutro Abs: 3.7 10*3/uL (ref 1.5–6.5)
PLATELETS: 272 10*3/uL (ref 145–400)
RBC: 3.62 MIL/uL — AB (ref 3.70–5.45)
RDW: 12.7 % (ref 11.2–14.5)
WBC Count: 5.4 10*3/uL (ref 3.9–10.3)

## 2017-09-19 LAB — RETICULOCYTES
RBC.: 3.62 MIL/uL — ABNORMAL LOW (ref 3.70–5.45)
RETIC COUNT ABSOLUTE: 61.5 10*3/uL (ref 33.7–90.7)
RETIC CT PCT: 1.7 % (ref 0.7–2.1)

## 2017-09-19 MED ORDER — HEPARIN SOD (PORK) LOCK FLUSH 100 UNIT/ML IV SOLN
500.0000 [IU] | Freq: Once | INTRAVENOUS | Status: AC | PRN
  Administered 2017-09-19: 500 [IU]
  Filled 2017-09-19: qty 5

## 2017-09-19 MED ORDER — ALTEPLASE 2 MG IJ SOLR
2.0000 mg | Freq: Once | INTRAMUSCULAR | Status: DC | PRN
  Filled 2017-09-19: qty 2

## 2017-09-19 MED ORDER — SODIUM CHLORIDE 0.9 % IV SOLN
240.0000 mg | Freq: Once | INTRAVENOUS | Status: AC
  Administered 2017-09-19: 240 mg via INTRAVENOUS
  Filled 2017-09-19: qty 24

## 2017-09-19 MED ORDER — SODIUM CHLORIDE 0.9 % IJ SOLN
10.0000 mL | INTRAMUSCULAR | Status: DC | PRN
  Administered 2017-09-19: 10 mL via INTRAVENOUS
  Filled 2017-09-19: qty 10

## 2017-09-19 MED ORDER — SODIUM CHLORIDE 0.9 % IV SOLN
Freq: Once | INTRAVENOUS | Status: AC
  Administered 2017-09-19: 10:00:00 via INTRAVENOUS

## 2017-09-19 MED ORDER — SODIUM CHLORIDE 0.9% FLUSH
10.0000 mL | INTRAVENOUS | Status: DC | PRN
  Administered 2017-09-19: 10 mL
  Filled 2017-09-19: qty 10

## 2017-09-19 NOTE — Patient Instructions (Signed)
[facility] Discharge Instructions for Patients Receiving Chemotherapy  Today you received the following chemotherapy agents: Nivolumab  To help prevent nausea and vomiting after your treatment, we encourage you to take your nausea medication as directed.    If you develop nausea and vomiting that is not controlled by your nausea medication, call the clinic.   BELOW ARE SYMPTOMS THAT SHOULD BE REPORTED IMMEDIATELY:  *FEVER GREATER THAN 100.5 F  *CHILLS WITH OR WITHOUT FEVER  NAUSEA AND VOMITING THAT IS NOT CONTROLLED WITH YOUR NAUSEA MEDICATION  *UNUSUAL SHORTNESS OF BREATH  *UNUSUAL BRUISING OR BLEEDING  TENDERNESS IN MOUTH AND THROAT WITH OR WITHOUT PRESENCE OF ULCERS  *URINARY PROBLEMS  *BOWEL PROBLEMS  UNUSUAL RASH Items with * indicate a potential emergency and should be followed up as soon as possible.  Feel free to call the clinic should you have any questions or concerns. The clinic phone number is [phone].  Please show the CHEMO ALERT CARD at check-in to the Emergency Department and triage nurse.   

## 2017-09-19 NOTE — Progress Notes (Signed)
Cardiology Office Note   Date:  09/20/2017   ID:  Marilyn [name], DOB Sep 18, 1965, MRN [id number]  PCP:  Marilyn Drafts, FNP  Cardiologist: Dr. Debara [name] Chief Complaint  Patient presents with  . Follow-up     History of Present Illness: Marilyn [name] is a 52 y.o. female who presents for ongoing assessment and management of nonischemic dilated cardiomyopathy, preserved EF at 38 to 60% per echo in April 2019, hypertension, hyperlipidemia, with other history to include metastatic lung cancer, asthma, diabetes, and anemia.  The patient has ongoing chest wall pain after having had radiation due to metastatic lung cancer.    On last office visit 08/15/2017 the patient indicated that she was going to have a biopsy by oncology to her right groin.  She was advised that she could take extra strength Tylenol every 6 hours for chest discomfort with pain management per oncology for stronger pain control if indicated.  She was also found to be tachycardic and admitted that she was drinking a lot of caffeine and had not taken metoprolol that morning.  She offers no complaints today other than fatigue.  She is due to have mammography completed the end of the week for ongoing evaluation for breast cancer.  The patient states she is medically compliant but on review of medication she is only taking metoprolol 25 mg daily instead of 50 mg daily as directed.  Past Medical History:  Diagnosis Date  . Arthritis   . Asthma   . Diabetes mellitus    Type II  . Hemorrhoids   . Hypercholesteremia   . Hypertension   . Hypothyroidism   . Metastatic lung cancer (metastasis from lung to other site) (HCC)    Lung, Mets to skin- right flank. CT angio 2019 suspicious for axillary, external iliac, right inguinal mets  . 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 NICM (nonischemic cardiomyopathy) ([place])    a. mild - EF 45-50% by echo 10/2016 but EF 52% by low risk nuc 11/2016. b. Echo 07/2017 - EF normal,  mild AI, unremarkable echo otherwise.  . Obese   . Pneumonia 2017  . Sinus tachycardia    a. dates back to at least 2013, etiology unclear.    Past Surgical History:  Procedure Laterality Date  . CESAREAN SECTION    . COLONOSCOPY    . I/D  arm Right 2013  . IR FLUORO GUIDE PORT INSERTION RIGHT  02/18/2017  . IR GENERIC HISTORICAL  01/08/2016   IR US GUIDE VASC ACCESS RIGHT 01/08/2016 Marilyn Mckusick, DO [facility]  . IR GENERIC HISTORICAL  01/08/2016   IR FLUORO GUIDE CV LINE RIGHT 01/08/2016 Marilyn Mckusick, DO [facility]  . IR US GUIDE VASC ACCESS RIGHT  02/18/2017  . MULTIPLE EXTRACTIONS WITH ALVEOLOPLASTY N/A 03/25/2017   Procedure: MULTIPLE EXTRACTION WITH ALVEOLOPLASTY (teeth #s four, six, seven, eight, nine, ten, eleven, one, twenty-three, twenty-four, twenty-five, twenty-six, twenty-nine, thirty);  Surgeon: Marilyn [name], [name];  Location: [facility];  Service: Oral Surgery;  Laterality: N/A;  . TUBAL LIGATION    . US guided core needle biopsy     of right lower neck/supraclavicular lymph nodes     Current Outpatient Medications  Medication Sig Dispense Refill  . aspirin EC 81 MG tablet Take 1 tablet (81 mg total) by mouth daily. 60 tablet 2  . clindamycin (CLINDAGEL) 1 % gel Apply topically 2 (two) times daily. To skin lesions in perineum -- folliculitis 30  g 0  . clotrimazole (CLOTRIMAZOLE ANTI-FUNGAL) 1 % cream Apply 1 application topically 2 (two) times daily. 30 g 0  . DULoxetine (CYMBALTA) 60 MG capsule Take 1 capsule (60 mg total) by mouth daily. 30 capsule 2  . insulin aspart (NOVOLOG) 100 UNIT/ML FlexPen As per sliding scale instructions (Patient taking differently: Inject 0-10 Units into the skin 3 (three) times daily. As per sliding scale instructions) 15 mL 2  . Insulin Degludec (TRESIBA FLEXTOUCH) 200 UNIT/ML SOPN Inject 100 Units into the skin daily before breakfast.     . ketoconazole (NIZORAL) 2 % cream Apply to rash once daily. Keep area dry between applications. 60 g  0  . levothyroxine (SYNTHROID, LEVOTHROID) 150 MCG tablet Take 1 tablet (150 mcg total) by mouth daily before breakfast. 30 tablet 2  . lidocaine-prilocaine (EMLA) cream Apply 1 application topically as needed. 30 g 6  . loratadine (CLARITIN) 10 MG tablet Take 10 mg by mouth daily.    . methocarbamol (ROBAXIN) 500 MG tablet Take 1 tablet (500 mg total) by mouth every 8 (eight) hours as needed for muscle spasms. 8 tablet 0  . metoprolol succinate (TOPROL-XL) 50 MG 24 hr tablet Take 1 tablet (50 mg total) by mouth daily. Take with or immediately following a meal. 30 tablet 5  . miconazole (MICOTIN) 2 % powder Apply topically 2 (two) times daily. Apply to area of rash over the perineum 85 g 1  . mupirocin cream (BACTROBAN) 2 % Apply 1 application topically 2 (two) times daily. 30 g 0  . mupirocin ointment (BACTROBAN) 2 % Place 1 application into the nose 2 (two) times daily. 22 g 1  . naproxen (NAPROSYN) 500 MG tablet Take 1 tablet (500 mg total) by mouth 2 (two) times daily with a meal. 20 tablet 0  . omeprazole (PRILOSEC) 20 MG capsule Take 1 capsule (20 mg total) by mouth daily. 30 capsule 1  . oxyCODONE-acetaminophen (PERCOCET) 5-325 MG tablet Take 1 tablet by mouth every 6 (six) hours as needed for moderate pain or severe pain. 50 tablet 0  . polyethylene glycol (MIRALAX) packet Take 17 g by mouth daily. 30 each 1  . senna-docusate (SENNA S) 8.6-50 MG tablet Take 2 tablets by mouth 2 (two) times daily. May reduce to 2 tab po HS as needed once regular BM estabished (Patient taking differently: Take 2 tablets by mouth 2 (two) times daily as needed (FOR CONSTIPATION). =) 60 tablet 1  . traZODone (DESYREL) 50 MG tablet Take 1-2 tablets (50-100 mg total) by mouth at bedtime as needed for sleep. 60 tablet 0   No current facility-administered medications for this visit.    Facility-Administered Medications Ordered in Other Visits  Medication Dose Route Frequency Provider Last Rate Last Dose  . sodium  chloride 0.9 % injection 10 mL  10 mL Intravenous PRN Marilyn Genera, MD   10 mL at 05/24/16 1252    Allergies:   Patient has no known allergies.    Social History:  The patient  reports that she has never smoked. She has never used smokeless tobacco. She reports that she does not drink alcohol or use drugs.   Family History:  The patient's family history includes Heart failure in her mother; Hypertension in her mother.    ROS: All other systems are reviewed and negative. Unless otherwise mentioned in H&P    PHYSICAL EXAM: VS:  BP 115/72   Pulse (!) 109   Ht 5\' 9"  (1.753 m)  Wt 272 lb (123.4 kg)   LMP 01/21/2016 Comment: pt signed preg test waiver 04/16/16   BMI 40.17 kg/m  , BMI Body mass index is 40.17 kg/m. GEN: Well nourished, well developed, in no acute distress  HEENT: normal  Neck: no JVD, carotid bruits, or masses Cardiac: RRR; tachycardic no murmurs, rubs, or gallops,no edema  Respiratory:  Clear to auscultation bilaterally, normal work of breathing GI: soft, nontender, nondistended, + BS MS: no deformity or atrophy  Skin: warm and dry, no rash Neuro:  Strength and sensation are intact Psych: euthymic mood, full affect   EKG: EKG not completed on this office visit today.  Recent Labs: 07/27/2017: B Natriuretic Peptide 43.2 07/29/2017: Magnesium 2.2 08/08/2017: TSH 3.992 09/19/2017: ALT 8; BUN 12; Creatinine, Ser 0.80; Hemoglobin 10.3; Platelet Count 272; Potassium 4.1; Sodium 137    Lipid Panel No results found for: CHOL, TRIG, HDL, CHOLHDL, VLDL, LDLCALC, LDLDIRECT    Wt Readings from Last 3 Encounters:  09/20/17 272 lb (123.4 kg)  09/19/17 272 lb 12.8 oz (123.7 kg)  08/22/17 274 lb 9.6 oz (124.6 kg)    Other studies Reviewed:  Echocardiogram 07/28/2017  Left ventricle: The cavity size was normal. Wall thickness was   increased in a pattern of mild LVH. Systolic function was normal.   The estimated ejection fraction was in the range of 55% to 60%.    Wall motion was normal; there were no regional wall motion   abnormalities. Left ventricular diastolic function parameters   were normal. - Aortic valve: Sclerosis without stenosis. There was mild   regurgitation. Valve area (Vmax): 0.78 cm^2. - Mitral valve: Mildly thickened leaflets . There was mild   regurgitation. - Left atrium: The atrium was normal in size. - Tricuspid valve: There was trivial regurgitation. - Pulmonary arteries: PA peak pressure: 25 mm Hg (S). - Inferior vena cava: The vessel was normal in size. The   respirophasic diameter changes were in the normal range (>= 50%),   consistent with normal central venous pressure.  Impressions:  - Compared to a prior study on 07/15/17, there has been no change.   ASSESSMENT AND PLAN:  1.  Nonischemic cardiomyopathy: Echocardiogram as above revealing normal LV systolic function.  The patient is tachycardic today and has not been taking metoprolol as directed.  On last office visit I increased her metoprolol to 50 mg daily but she continued to take 25 mg daily.  I have advised her to increase her dose as directed.  She can take 2 of the 25 mg in the a.m. until she runs out of her prescription but a new prescription for 50 mg of metoprolol succinate has been sent.  She knows to only take 1 pill on renewal of prescription.  2.  Hypertension: Blood pressure is normal today.  Not slightly hypotensive.  With increased dose of metoprolol, hopefully blood pressure will remain stable as heart rate will be better controlled.  3.  Hypercholesterolemia: Patient currently not on statin therapy.  Will need to have follow-up lipids and LFTs on next office visit.  Current medicines are reviewed at length with the patient today.    Labs/ tests ordered today include: none  Phill Myron. West Pugh, ANP, AACC   09/20/2017 1:20 PM    [facility] [address] [place], [place] [address] Phone: [phone]; Fax: [phone]

## 2017-09-20 ENCOUNTER — Ambulatory Visit: Payer: Medicaid Other | Admitting: Adult Health

## 2017-09-20 ENCOUNTER — Encounter: Payer: Self-pay | Admitting: Adult Health

## 2017-09-20 VITALS — BP 115/72 | HR 109 | Ht 69.0 in | Wt 272.0 lb

## 2017-09-20 DIAGNOSIS — I43 Cardiomyopathy in diseases classified elsewhere: Secondary | ICD-10-CM

## 2017-09-20 DIAGNOSIS — I10 Essential (primary) hypertension: Secondary | ICD-10-CM | POA: Diagnosis not present

## 2017-09-20 NOTE — Patient Instructions (Addendum)
Medication Instructions:  INCREASE METOPROLOL 50MG  (2TAB) DAILY UNTIL FINISHED  If you need a refill on your cardiac medications before your next appointment, please call your pharmacy.  Follow-Up: Your physician wants you to follow-up in: [place] (NURSE PRACTIONIER), DNP,AACC IF PRIMARY CARDIOLOGIST IS UNAVAILABLE.   Thank you for choosing [facility] at [facility]!!

## 2017-09-23 ENCOUNTER — Other Ambulatory Visit: Payer: Medicaid Other

## 2017-09-26 IMAGING — CT CT ABD-PELV W/ CM
3 of 11 series · 9 of 46 positions shown, 15 images · IV contrast (ISOVUE 370)
Comparison: Chest radiographs obtained today.

CLINICAL DATA: [REDACTED]s.  History of lung cancer.  Right flank mass.



[Series 5: abd/pel with · axial · 0.94mm/px · z∈[-699,-394]mm · 4 of 103 slices shown, 9 images]
[im 21/103  soft-tissue]
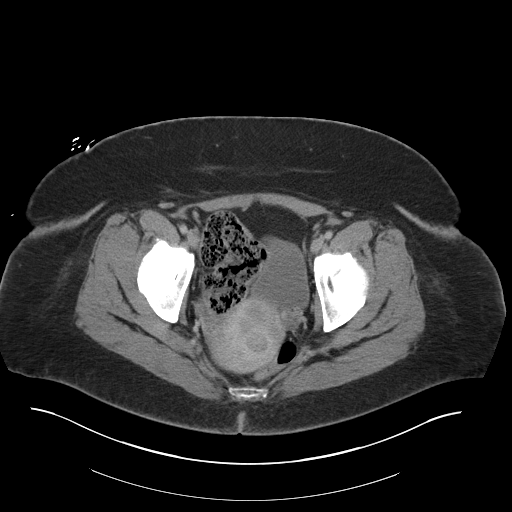
[im 21/103  lung]
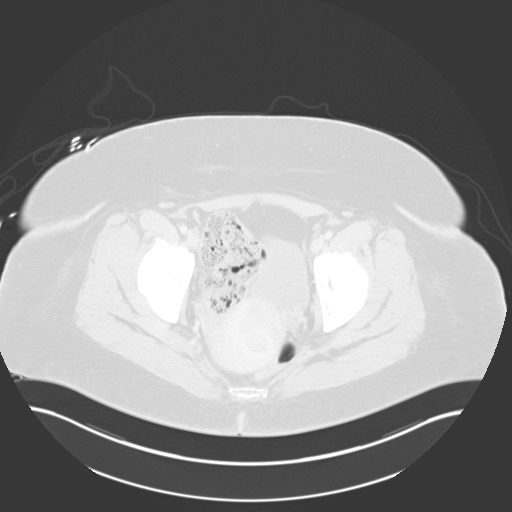
[im 21/103  bone]
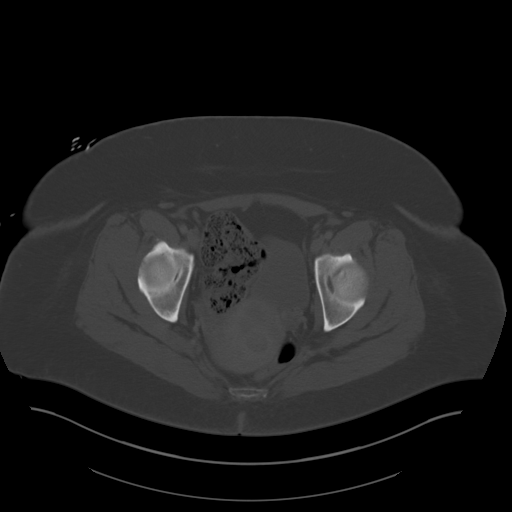
[im 41/103  soft-tissue]
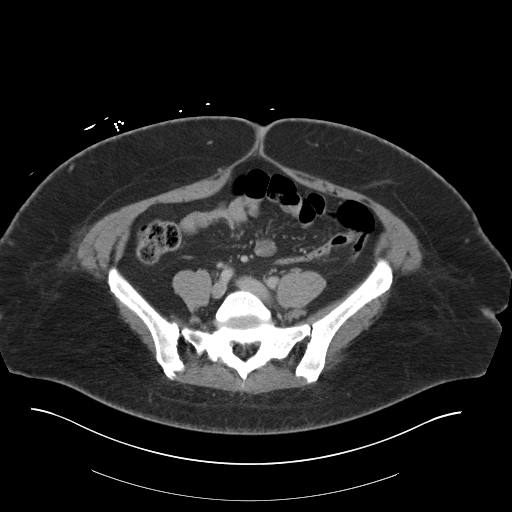
[im 41/103  lung]
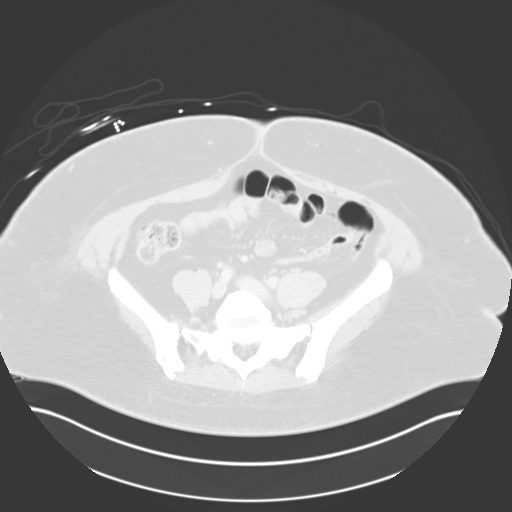
[im 62/103  soft-tissue]
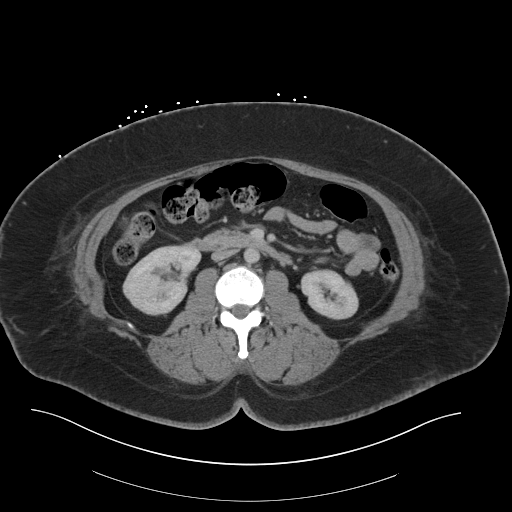
[im 62/103  lung]
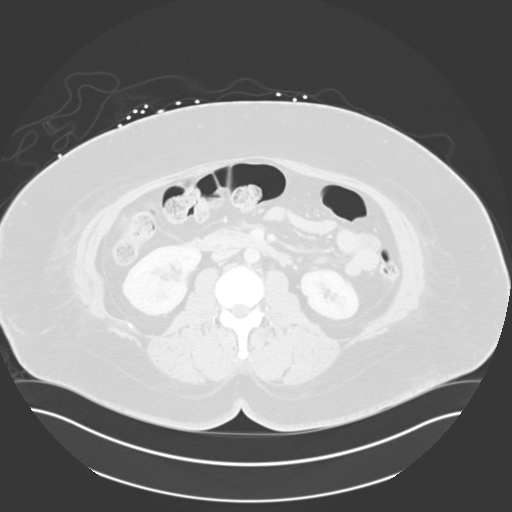
[im 82/103  soft-tissue]
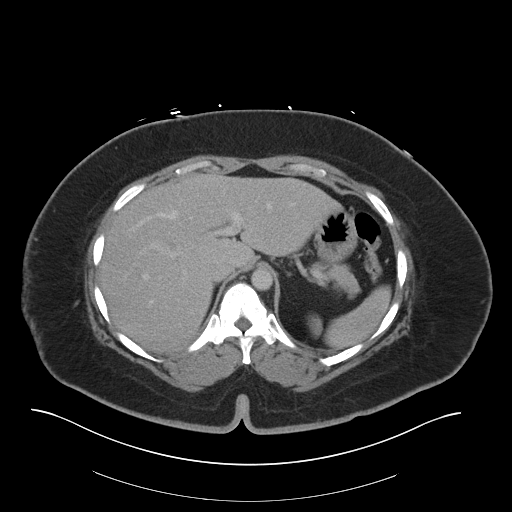
[im 82/103  lung]
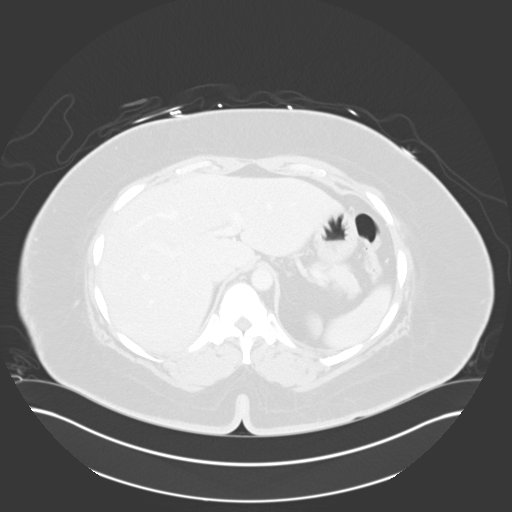

[Series 7: coronal mpr · coronal · 0.48mm/px · 1 of 151 slices shown, 2 images]
[im 76/151  soft-tissue]
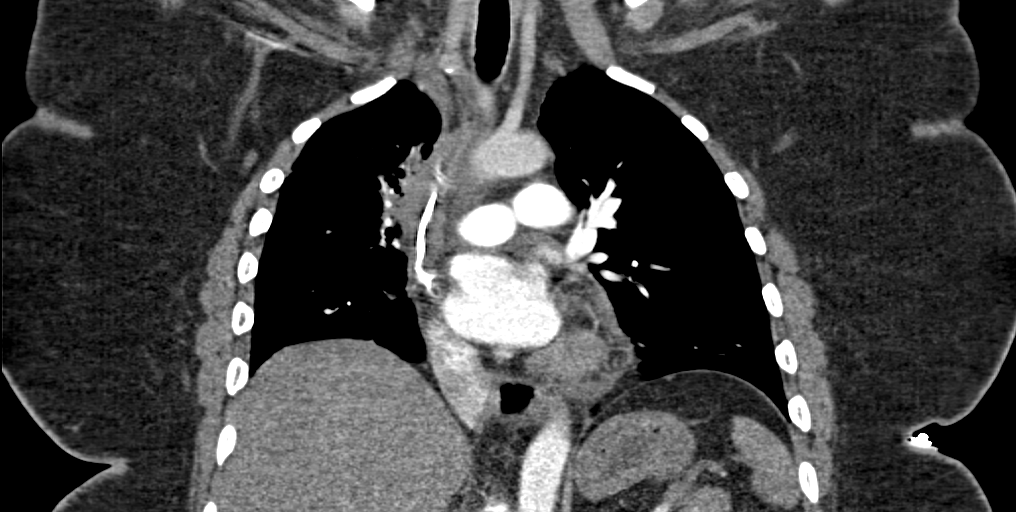
[im 76/151  bone]
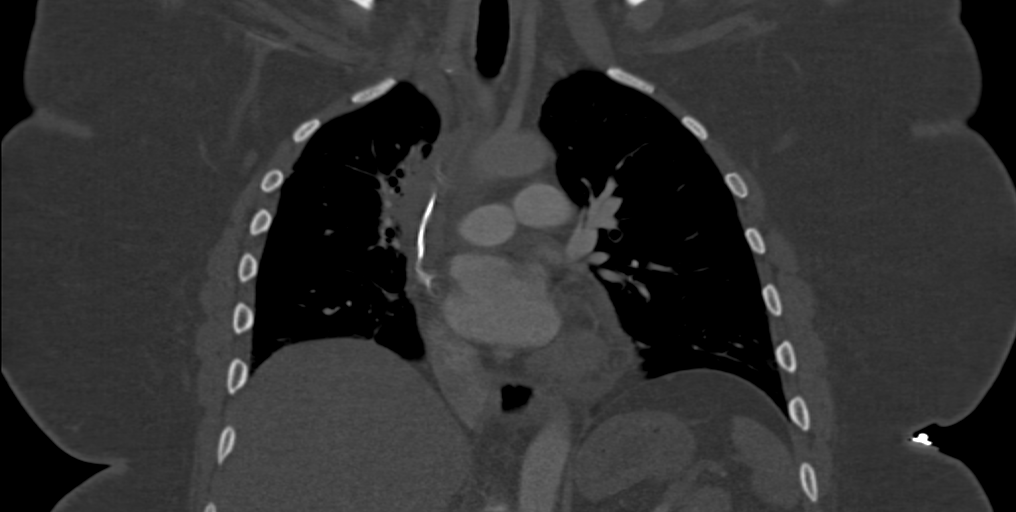

[Series 12: thins for pacs · axial · 0.74mm/px · z∈[-386,-287]mm · 4 of 248 slices shown]
[im 17/248  soft-tissue]
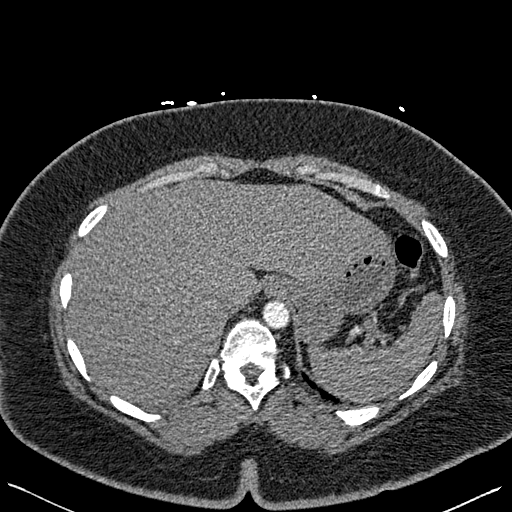
[im 50/248  soft-tissue]
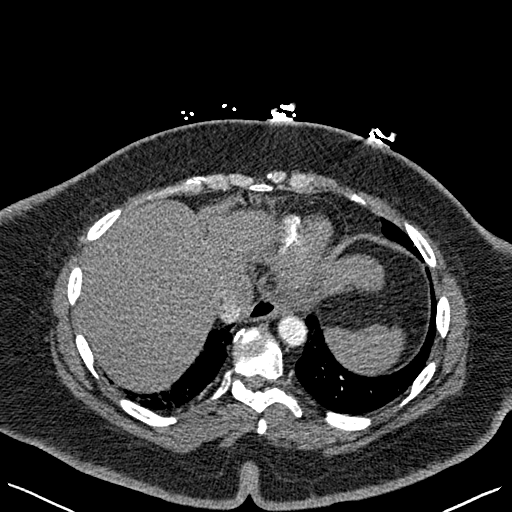
[im 83/248  soft-tissue]
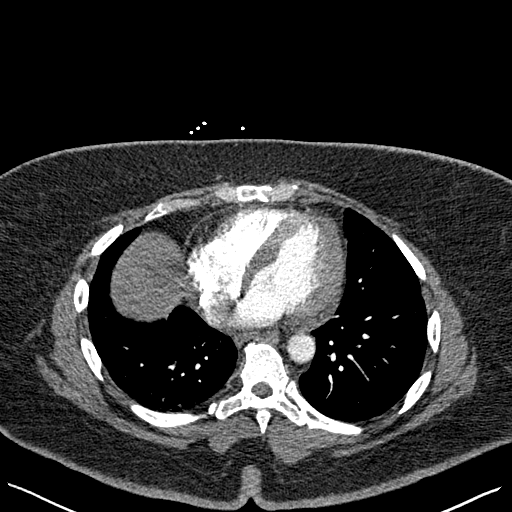
[im 116/248  soft-tissue]
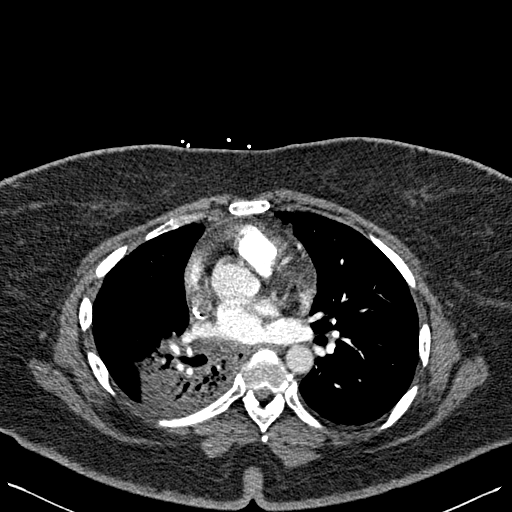

[9 of 46 positions shown; findings below may reference images not displayed]

Chest CTA dated
07/05/2016. Chest CT dated 09/13/2016. Abdomen and pelvis CT dated
12/27/2015.
FINDINGS: CTA CHEST FINDINGS

Cardiovascular: Normally opacified pulmonary arteries with no
pulmonary arterial filling defects seen. Small pericardial effusion
with a maximum thickness of 8 mm.

Mediastinum/Nodes: Soft tissue density in the precarinal mediastinum
on the right, previously measuring 3.8 cm in maximum diameter,
currently measures 2.9 cm and maximum diameter. The previously
demonstrated 10 mm short axis subcarinal node continues to have a 10
mm short axis diameter on image number 43 of series 4. No enlarged
lymph nodes elsewhere in the mediastinum. Unremarkable included
thyroid gland.

Lungs/Pleura: Postradiation changes in the medial aspect of the
right lung are stable. Clear left lung.

Musculoskeletal: Thoracic spine degenerative changes.

Review of the MIP images confirms the above findings.

CT ABDOMEN and PELVIS FINDINGS

Hepatobiliary: Multiple large, minimally calcified gallstones in the
gallbladder. The largest measures 1.6 cm in maximum diameter. No
gallbladder wall thickening or pericholecystic fluid. Unremarkable
liver.

Pancreas: Unremarkable. No pancreatic ductal dilatation or
surrounding inflammatory changes.

Spleen: Normal in size without focal abnormality.

Adrenals/Urinary Tract: Adrenal glands are unremarkable. Kidneys are
normal, without renal calculi, focal lesion, or hydronephrosis.
Bladder is unremarkable.

Stomach/Bowel: Unremarkable stomach, small bowel and colon. No
evidence of appendicitis.

Vascular/Lymphatic: No significant vascular findings are present. No
enlarged abdominal or pelvic lymph nodes.

Reproductive: Multiple uterine masses, measuring up to 4.1 cm in
maximum diameter each. Normal appearing ovaries.

Other: Lobulated, oval, heterogeneous enhancing mass in the right
lateral subcutaneous fat at the level of the mid abdomen. This mass
measures 5.0 x 4.7 cm in maximum dimensions on image number 48 of
series 5. This measures 5.0 cm in length on coronal image number 91.

Very small umbilical hernia containing fat.

Musculoskeletal: Minimal lumbar spine degenerative changes. No
evidence of bony metastatic disease.

Review of the MIP images confirms the above findings.
IMPRESSION: 1. No pulmonary emboli.
2. No acute chest, abdomen or pelvic abnormality.
3. Interval 5.0 x 5.0 x 4.7 cm mass in the right lateral
subcutaneous fat at the level of the mid abdomen. This has CT
features compatible with a metastasis or primary neoplasm. This
would be amenable to ultrasound-guided core needle biopsy.
4. Interval decrease in size of the previously demonstrated
precarinal mediastinal mass.
5. Stable mildly enlarged subcarinal lymph node.
6. Stable right lung postradiation changes.
7. Cholelithiasis without evidence of cholecystitis.
8. Small pericardial effusion with a maximum thickness of 8 mm.

## 2017-09-26 IMAGING — CR DG CHEST 2V
2 series · 2 of 2 positions shown · non-contrast
Comparison: 09/13/2016

CLINICAL DATA: Shortness of breath and chest pain

EXAM:
CHEST  2 VIEW

[w chest pa]
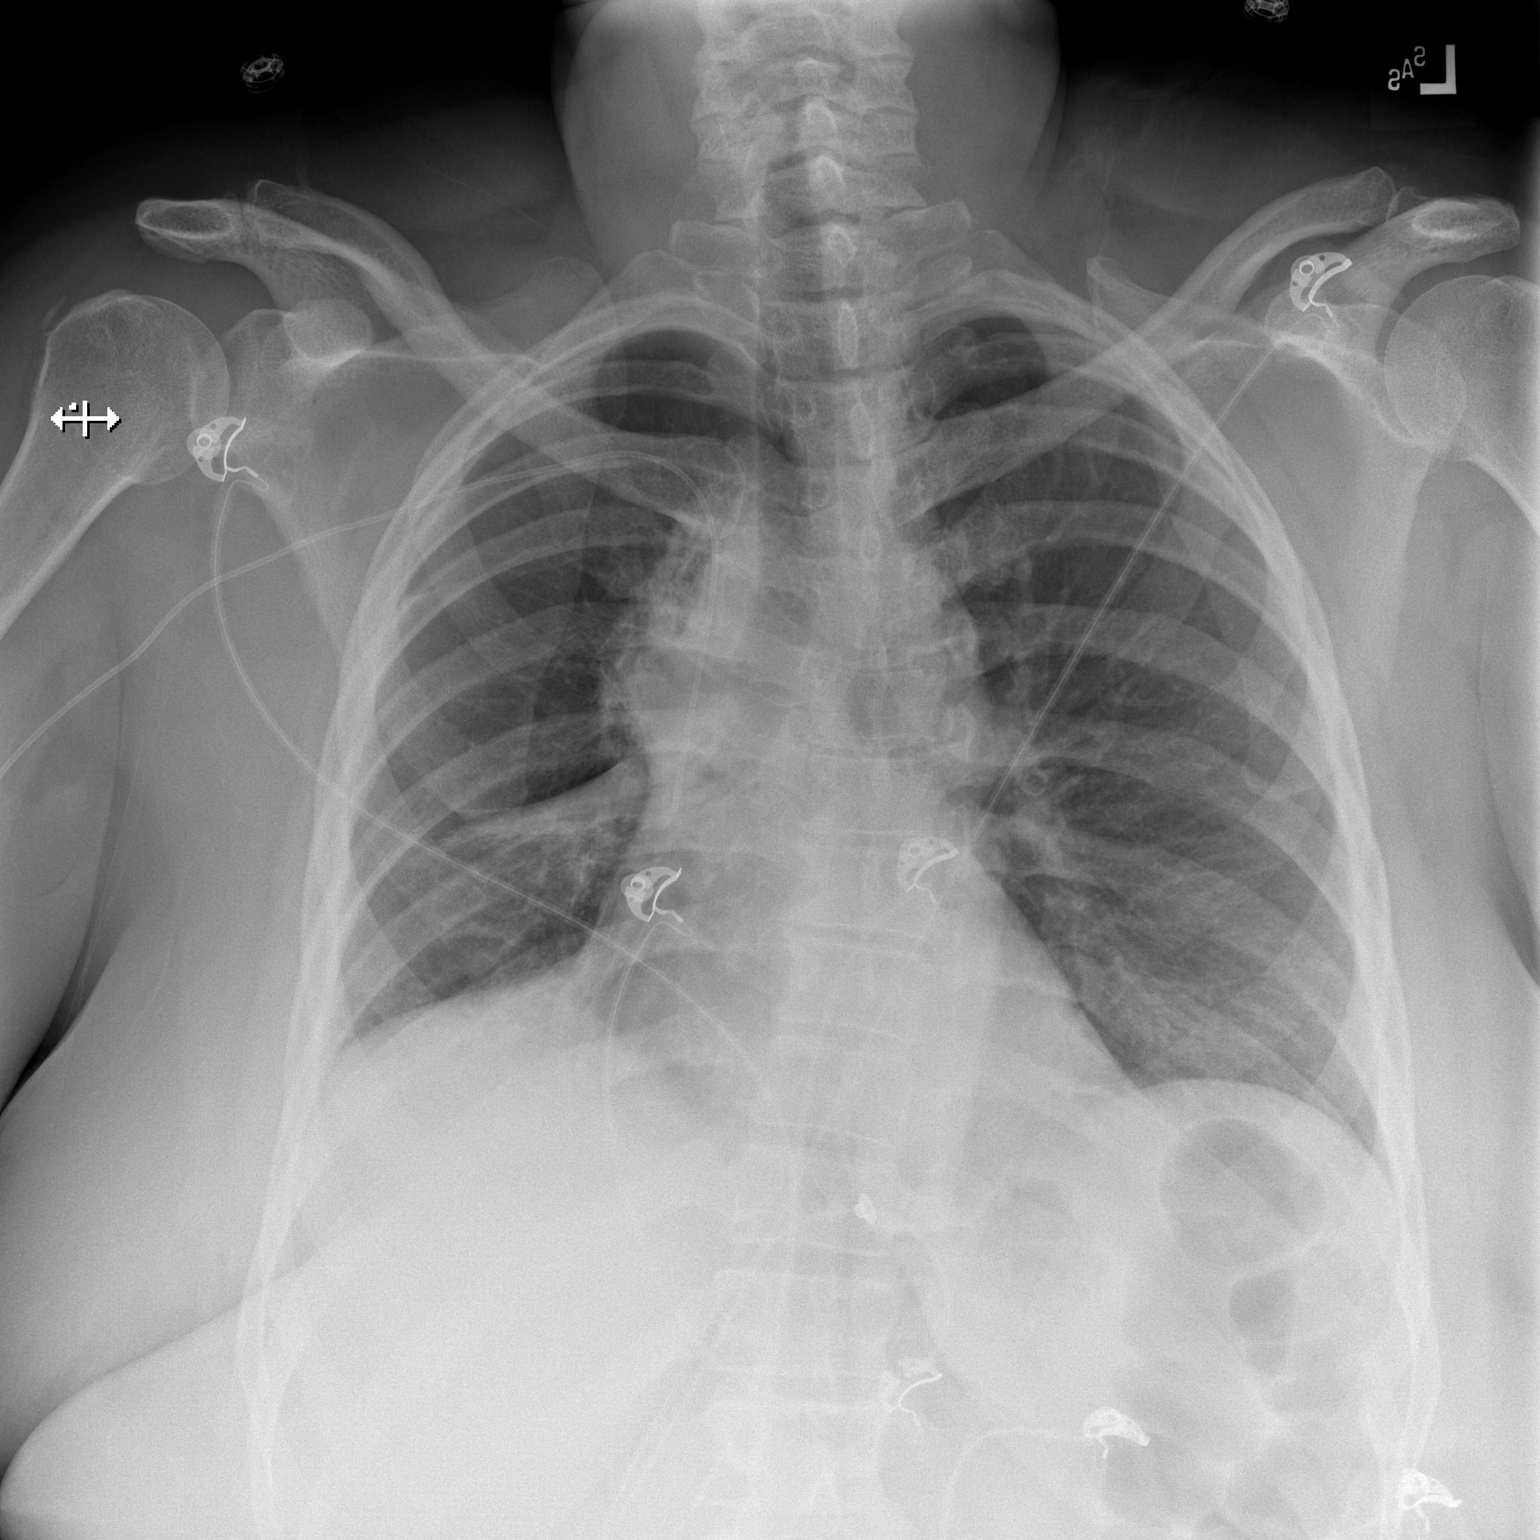

[w chest lat]
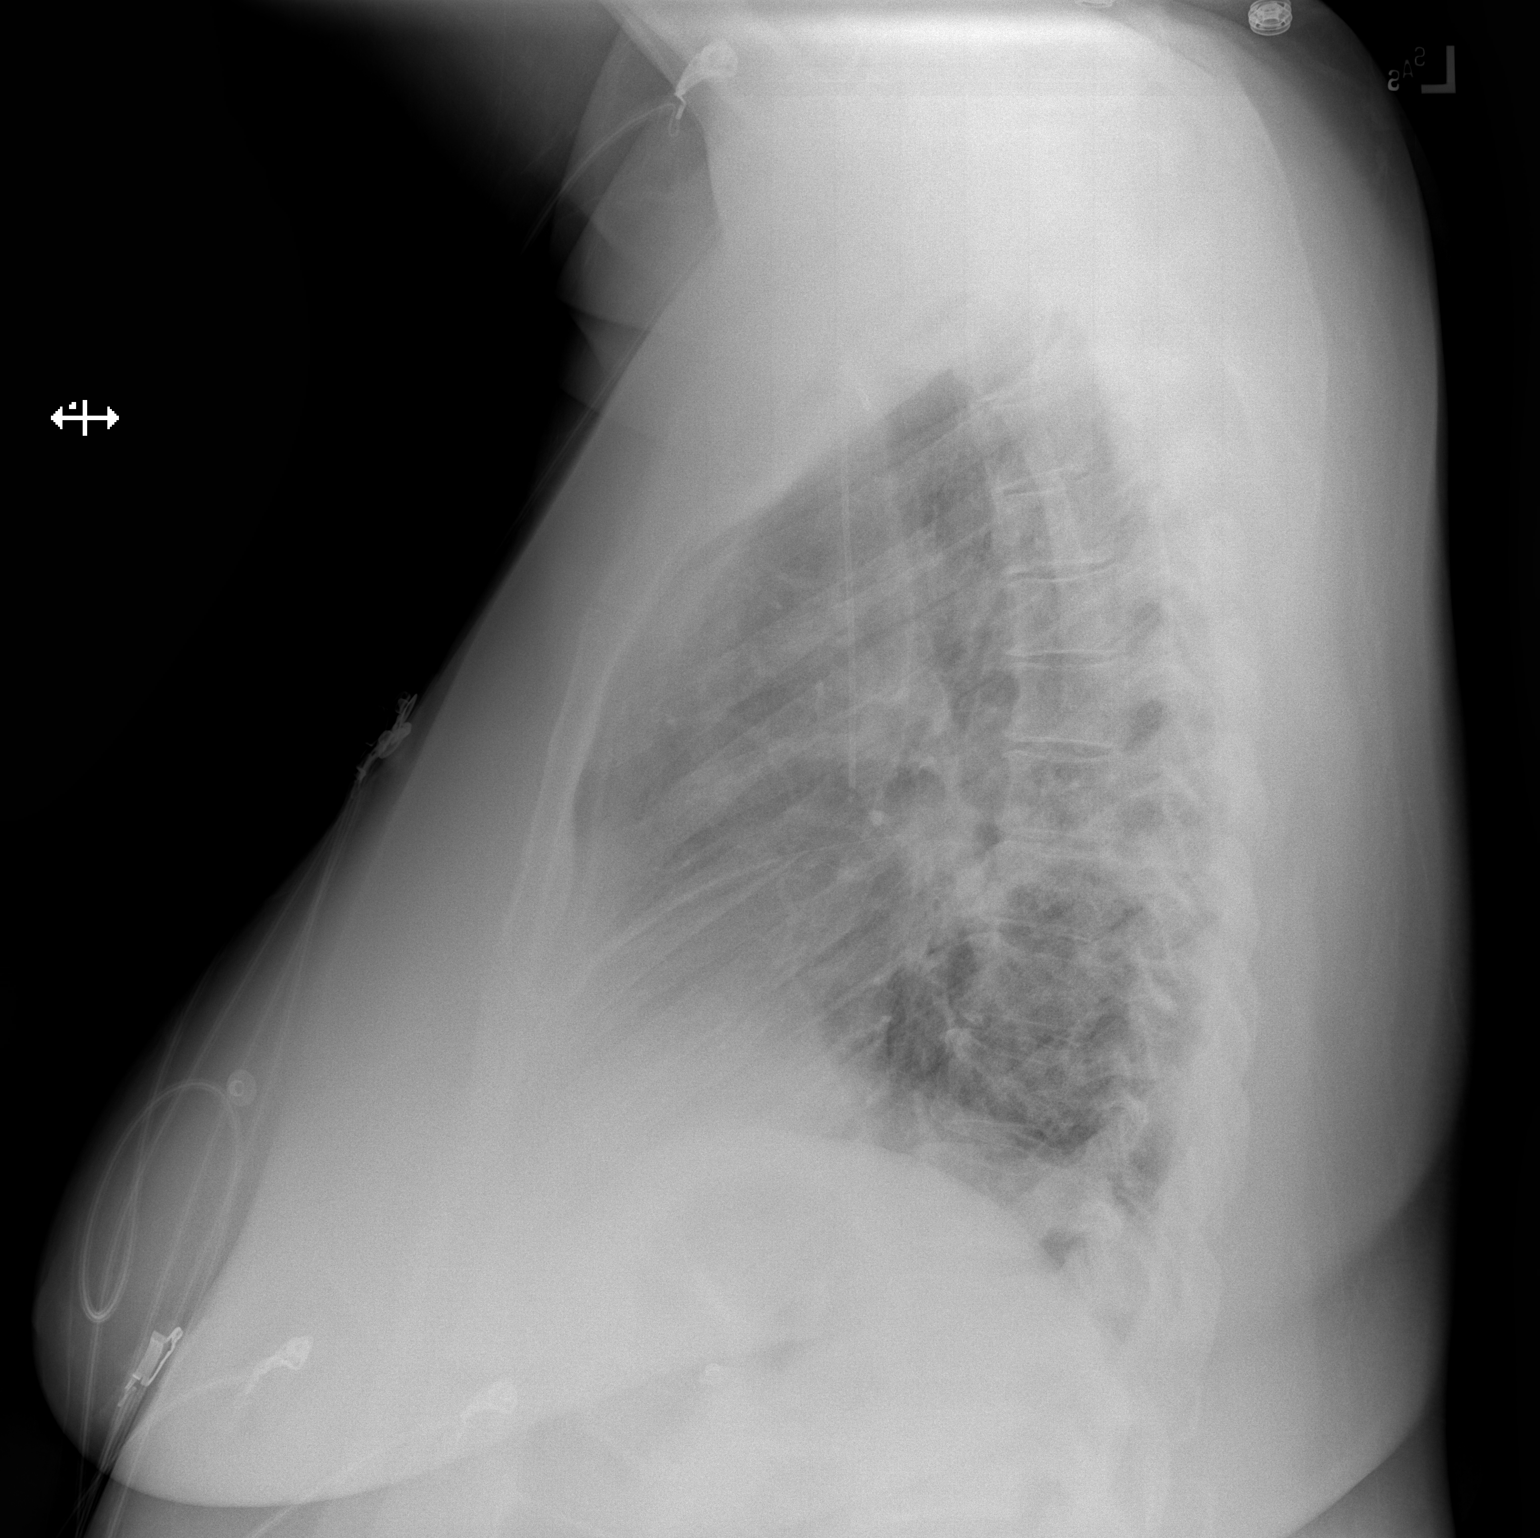

[2 of 2 positions shown; findings below may reference images not displayed]

FINDINGS: Postradiation changes are again seen in the right perihilar and
suprahilar region. Right-sided PICC line is again noted at the
cavoatrial junction. No focal infiltrate or sizable effusion is
seen. No acute bony abnormality is noted.
IMPRESSION: Chronic changes without acute abnormality.

## 2017-09-28 ENCOUNTER — Ambulatory Visit: Payer: Medicaid Other

## 2017-09-28 ENCOUNTER — Ambulatory Visit
Admission: RE | Admit: 2017-09-28 | Discharge: 2017-09-28 | Disposition: A | Discharge status: Home / Self Care | Payer: Medicaid Other | Source: Ambulatory Visit | Attending: Nurse Practitioner | Admitting: Nurse Practitioner

## 2017-09-28 DIAGNOSIS — N644 Mastodynia: Secondary | ICD-10-CM

## 2017-10-03 ENCOUNTER — Inpatient Hospital Stay: Payer: Medicaid Other | Attending: Hematology

## 2017-10-03 ENCOUNTER — Inpatient Hospital Stay: Payer: Medicaid Other

## 2017-10-03 VITALS — BP 126/84 | HR 113 | Temp 98.3°F | Resp 18

## 2017-10-03 DIAGNOSIS — M129 Arthropathy, unspecified: Secondary | ICD-10-CM | POA: Insufficient documentation

## 2017-10-03 DIAGNOSIS — M940 Chondrocostal junction syndrome [Tietze]: Secondary | ICD-10-CM | POA: Insufficient documentation

## 2017-10-03 DIAGNOSIS — Z9221 Personal history of antineoplastic chemotherapy: Secondary | ICD-10-CM | POA: Insufficient documentation

## 2017-10-03 DIAGNOSIS — C3491 Malignant neoplasm of unspecified part of right bronchus or lung: Secondary | ICD-10-CM

## 2017-10-03 DIAGNOSIS — E7800 Pure hypercholesterolemia, unspecified: Secondary | ICD-10-CM | POA: Insufficient documentation

## 2017-10-03 DIAGNOSIS — R000 Tachycardia, unspecified: Secondary | ICD-10-CM | POA: Insufficient documentation

## 2017-10-03 DIAGNOSIS — E1165 Type 2 diabetes mellitus with hyperglycemia: Secondary | ICD-10-CM | POA: Insufficient documentation

## 2017-10-03 DIAGNOSIS — Z794 Long term (current) use of insulin: Secondary | ICD-10-CM | POA: Diagnosis not present

## 2017-10-03 DIAGNOSIS — Z923 Personal history of irradiation: Secondary | ICD-10-CM | POA: Diagnosis not present

## 2017-10-03 DIAGNOSIS — G893 Neoplasm related pain (acute) (chronic): Secondary | ICD-10-CM | POA: Insufficient documentation

## 2017-10-03 DIAGNOSIS — G629 Polyneuropathy, unspecified: Secondary | ICD-10-CM | POA: Diagnosis not present

## 2017-10-03 DIAGNOSIS — Z95828 Presence of other vascular implants and grafts: Secondary | ICD-10-CM

## 2017-10-03 DIAGNOSIS — E669 Obesity, unspecified: Secondary | ICD-10-CM | POA: Insufficient documentation

## 2017-10-03 DIAGNOSIS — G4700 Insomnia, unspecified: Secondary | ICD-10-CM | POA: Insufficient documentation

## 2017-10-03 DIAGNOSIS — C792 Secondary malignant neoplasm of skin: Secondary | ICD-10-CM | POA: Insufficient documentation

## 2017-10-03 DIAGNOSIS — Z5111 Encounter for antineoplastic chemotherapy: Secondary | ICD-10-CM | POA: Diagnosis not present

## 2017-10-03 DIAGNOSIS — Z452 Encounter for adjustment and management of vascular access device: Secondary | ICD-10-CM

## 2017-10-03 DIAGNOSIS — I429 Cardiomyopathy, unspecified: Secondary | ICD-10-CM | POA: Diagnosis not present

## 2017-10-03 DIAGNOSIS — E039 Hypothyroidism, unspecified: Secondary | ICD-10-CM | POA: Insufficient documentation

## 2017-10-03 DIAGNOSIS — I10 Essential (primary) hypertension: Secondary | ICD-10-CM | POA: Insufficient documentation

## 2017-10-03 DIAGNOSIS — Z7982 Long term (current) use of aspirin: Secondary | ICD-10-CM | POA: Insufficient documentation

## 2017-10-03 DIAGNOSIS — R51 Headache: Secondary | ICD-10-CM | POA: Insufficient documentation

## 2017-10-03 DIAGNOSIS — D649 Anemia, unspecified: Secondary | ICD-10-CM | POA: Insufficient documentation

## 2017-10-03 DIAGNOSIS — Z5112 Encounter for antineoplastic immunotherapy: Secondary | ICD-10-CM | POA: Insufficient documentation

## 2017-10-03 DIAGNOSIS — Z79899 Other long term (current) drug therapy: Secondary | ICD-10-CM | POA: Diagnosis not present

## 2017-10-03 LAB — CMP (CANCER CENTER ONLY)
ALT: 11 U/L (ref 0–44)
AST: 11 U/L — AB (ref 15–41)
Albumin: 3.1 g/dL — ABNORMAL LOW (ref 3.5–5.0)
Alkaline Phosphatase: 87 U/L (ref 38–126)
Anion gap: 7 (ref 5–15)
BUN: 10 mg/dL (ref 6–20)
CHLORIDE: 103 mmol/L (ref 98–111)
CO2: 28 mmol/L (ref 22–32)
CREATININE: 0.78 mg/dL (ref 0.44–1.00)
Calcium: 9 mg/dL (ref 8.9–10.3)
GFR, Est AFR Am: >60 mL/min (ref >60)
GFR, Estimated: >60 mL/min (ref >60)
Glucose, Bld: 312 mg/dL — ABNORMAL HIGH (ref 70–99)
Potassium: 3.9 mmol/L (ref 3.5–5.1)
SODIUM: 138 mmol/L (ref 135–145)
Total Bilirubin: 0.2 mg/dL — ABNORMAL LOW (ref 0.3–1.2)
Total Protein: 6.9 g/dL (ref 6.5–8.1)

## 2017-10-03 LAB — CBC WITH DIFFERENTIAL/PLATELET
Basophils Absolute: 0 10*3/uL (ref 0.0–0.1)
Basophils Relative: 0 %
EOS ABS: 0.3 10*3/uL (ref 0.0–0.5)
EOS PCT: 5 %
HCT: 31.4 % — ABNORMAL LOW (ref 34.8–46.6)
HEMOGLOBIN: 10.3 g/dL — AB (ref 11.6–15.9)
LYMPHS ABS: 1.4 10*3/uL (ref 0.9–3.3)
Lymphocytes Relative: 26 %
MCH: 28.2 pg (ref 25.1–34.0)
MCHC: 32.8 g/dL (ref 31.5–36.0)
MCV: 86 fL (ref 79.5–101.0)
MONOS PCT: 5 %
Monocytes Absolute: 0.3 10*3/uL (ref 0.1–0.9)
Neutro Abs: 3.5 10*3/uL (ref 1.5–6.5)
Neutrophils Relative %: 64 %
PLATELETS: 253 10*3/uL (ref 145–400)
RBC: 3.65 MIL/uL — ABNORMAL LOW (ref 3.70–5.45)
RDW: 13 % (ref 11.2–14.5)
WBC: 5.4 10*3/uL (ref 3.9–10.3)

## 2017-10-03 MED ORDER — SODIUM CHLORIDE 0.9 % IV SOLN
240.0000 mg | Freq: Once | INTRAVENOUS | Status: AC
  Administered 2017-10-03: 240 mg via INTRAVENOUS
  Filled 2017-10-03: qty 24

## 2017-10-03 MED ORDER — SODIUM CHLORIDE 0.9 % IJ SOLN
10.0000 mL | INTRAMUSCULAR | Status: DC | PRN
  Administered 2017-10-03: 10 mL via INTRAVENOUS
  Filled 2017-10-03: qty 10

## 2017-10-03 MED ORDER — ALTEPLASE 2 MG IJ SOLR
INTRAMUSCULAR | Status: AC
  Filled 2017-10-03: qty 2

## 2017-10-03 MED ORDER — SODIUM CHLORIDE 0.9 % IV SOLN
Freq: Once | INTRAVENOUS | Status: AC
  Administered 2017-10-03: 09:00:00 via INTRAVENOUS

## 2017-10-03 MED ORDER — HEPARIN SOD (PORK) LOCK FLUSH 100 UNIT/ML IV SOLN
500.0000 [IU] | Freq: Once | INTRAVENOUS | Status: AC | PRN
  Administered 2017-10-03: 500 [IU] via INTRAVENOUS
  Filled 2017-10-03: qty 5

## 2017-10-03 MED ORDER — ALTEPLASE 2 MG IJ SOLR
2.0000 mg | Freq: Once | INTRAMUSCULAR | Status: AC | PRN
  Administered 2017-10-03: 2 mg
  Filled 2017-10-03: qty 2

## 2017-10-03 NOTE — Patient Instructions (Signed)
Cancer Center Discharge Instructions for Patients Receiving Chemotherapy  Today you received the following chemotherapy agents Opdivo  To help prevent nausea and vomiting after your treatment, we encourage you to take your nausea medication as directed   If you develop nausea and vomiting that is not controlled by your nausea medication, call the clinic.   BELOW ARE SYMPTOMS THAT SHOULD BE REPORTED IMMEDIATELY:  *FEVER GREATER THAN 100.5 F  *CHILLS WITH OR WITHOUT FEVER  NAUSEA AND VOMITING THAT IS NOT CONTROLLED WITH YOUR NAUSEA MEDICATION  *UNUSUAL SHORTNESS OF BREATH  *UNUSUAL BRUISING OR BLEEDING  TENDERNESS IN MOUTH AND THROAT WITH OR WITHOUT PRESENCE OF ULCERS  *URINARY PROBLEMS  *BOWEL PROBLEMS  UNUSUAL RASH Items with * indicate a potential emergency and should be followed up as soon as possible.  Feel free to call the clinic should you have any questions or concerns. The clinic phone number is [phone].  Please show the CHEMO ALERT CARD at check-in to the Emergency Department and triage nurse.   

## 2017-10-14 NOTE — Progress Notes (Signed)
[name]    HEMATOLOGY/ONCOLOGY CLINIC NOTE  Date of Service: 09/19/17   Patient Care Team: [name], FNP as PCP - General (Nurse Practitioner) Pixie Casino, MD as PCP - Cardiology (Cardiology)  CHIEF COMPLAINTS  F/u for continued management of metastatic lung adenocarcinoma  Diagnosis Metastatic Lung Adenocarcinoma with rt flank subcutaneous metastasis.  Current treatment:  Nivolumab q2 weeks  PreviousTreatment -Concurrent Chemo-radiation with Carboplatin/Taxol. Completed RT on 02/13/2016 and has then completed 2 cycles of carboplatin + taxol. -Started Maintenance Durvalumab from  05/24/2016 q2weeks, switched to Nivolumab q2weeks on 12/13/16 once metastatic disease noted -s/p palliative RT to rt sided Subcutaneous metastatic mass.    HISTORY OF PRESENTING ILLNESS: Plz see my previous note for details on initial presentation.  INTERVAL HISTORY   Marilyn [name] presents to the office today for followup of her metastatic lung cancer. The patient's last visit with [place] was on 09/19/17. She is accompanied today by her mother. The pt reports that she is doing well overall.   The pt reports that her breathing has gotten better, with less SOB. She denies any chest pain or fatigue.   She notes that her inguinal mass has been stable, is not painful or uncomfortable, and has not grown or changed in character. She denies developing any constitutional symptoms.   She notes that she occasionally has some neck pains and headaches in the back of her head. She denies this changing recently and denies associating this with the way she is sleeping at night.   Of note since the patient's last visit, pt has had Mammogram completed on 09/28/17 with results revealing No evidence of malignancy within either breast.  She notes that her right abdomen/flank is continuing to hurt, but she does not endorse this changing in character or frequency.   Lab results today (10/17/17) of CBC w/diff, CMP,  and Reticulocytes is as follows: all values are WNL except for HGB at 10.4, HCT at 32.2, Albumin at 3.1, AST at 14.  On review of systems, pt reports improved breathing, resolving SOB, stable inguinal mass, stable energy levels, and denies inguinal discomfort/pain, skin rashes, diarrhea, mouth sores, fatigue, fevers, chills, night sweats, chest pain, and any other symptoms.   MEDICAL HISTORY:  Past Medical History:  Diagnosis Date  . Arthritis   . Asthma   . Diabetes mellitus    Type II  . Hemorrhoids   . Hypercholesteremia   . Hypertension   . Hypothyroidism   . Metastatic lung cancer (metastasis from lung to other site) (HCC)    Lung, Mets to skin- right flank. CT angio 2019 suspicious for axillary, external iliac, right inguinal mets  . 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 NICM (nonischemic cardiomyopathy) ([place])    a. mild - EF 45-50% by echo 10/2016 but EF 52% by low risk nuc 11/2016. b. Echo 07/2017 - EF normal, mild AI, unremarkable echo otherwise.  . Obese   . Pneumonia 2017  . Sinus tachycardia    a. dates back to at least 2013, etiology unclear.    SURGICAL HISTORY: Past Surgical History:  Procedure Laterality Date  . CESAREAN SECTION    . COLONOSCOPY    . I/D  arm Right 2013  . IR FLUORO GUIDE PORT INSERTION RIGHT  02/18/2017  . IR GENERIC HISTORICAL  01/08/2016   IR US GUIDE VASC ACCESS RIGHT 01/08/2016 [name], DO [facility]  . IR GENERIC HISTORICAL  01/08/2016   IR FLUORO  GUIDE CV LINE RIGHT 01/08/2016 [name], DO [facility]  . IR US GUIDE VASC ACCESS RIGHT  02/18/2017  . MULTIPLE EXTRACTIONS WITH ALVEOLOPLASTY N/A 03/25/2017   Procedure: MULTIPLE EXTRACTION WITH ALVEOLOPLASTY (teeth #s four, six, seven, eight, nine, ten, eleven, one, twenty-three, twenty-four, twenty-five, twenty-six, twenty-nine, thirty);  Surgeon: [name], [name];  Location: [facility];  Service: Oral Surgery;  Laterality: N/A;  . TUBAL LIGATION    . US guided core  needle biopsy     of right lower neck/supraclavicular lymph nodes    SOCIAL HISTORY: Social History   Socioeconomic History  . Marital status: Divorced    Spouse name: Not on file  . Number of children: Not on file  . Years of education: Not on file  . Highest education level: Not on file  Occupational History  . Not on file  Social Needs  . Financial resource strain: Not on file  . Food insecurity:    Worry: Not on file    Inability: Not on file  . Transportation needs:    Medical: Not on file    Non-medical: Not on file  Tobacco Use  . Smoking status: Never Smoker  . Smokeless tobacco: Never Used  Substance and Sexual Activity  . Alcohol use: No  . Drug use: No  . Sexual activity: Not Currently  Lifestyle  . Physical activity:    Days per week: Not on file    Minutes per session: Not on file  . Stress: Not on file  Relationships  . Social connections:    Talks on phone: Not on file    Gets together: Not on file    Attends religious service: Not on file    Active member of club or organization: Not on file    Attends meetings of clubs or organizations: Not on file    Relationship status: Not on file  . Intimate partner violence:    Fear of current or ex partner: Not on file    Emotionally abused: Not on file    Physically abused: Not on file    Forced sexual activity: Not on file  Other Topics Concern  . Not on file  Social History Narrative   No bird or mold exposure. No recent travel.    FAMILY HISTORY: Family History  Problem Relation Age of Onset  . Heart failure Mother   . Hypertension Mother   . Cancer Neg Hx   . Rheumatologic disease Neg Hx     ALLERGIES:  has No Known Allergies.  MEDICATIONS:  Current Outpatient Medications  Medication Sig Dispense Refill  . aspirin EC 81 MG tablet Take 1 tablet (81 mg total) by mouth daily. 60 tablet 2  . clindamycin (CLINDAGEL) 1 % gel Apply topically 2 (two) times daily. To skin lesions in perineum --  folliculitis 30 g 0  . clotrimazole (CLOTRIMAZOLE ANTI-FUNGAL) 1 % cream Apply 1 application topically 2 (two) times daily. 30 g 0  . DULoxetine (CYMBALTA) 60 MG capsule Take 1 capsule (60 mg total) by mouth daily. 30 capsule 2  . insulin aspart (NOVOLOG) 100 UNIT/ML FlexPen As per sliding scale instructions (Patient taking differently: Inject 0-10 Units into the skin 3 (three) times daily. As per sliding scale instructions) 15 mL 2  . Insulin Degludec (TRESIBA FLEXTOUCH) 200 UNIT/ML SOPN Inject 100 Units into the skin daily before breakfast.     . ketoconazole (NIZORAL) 2 % cream Apply to rash once daily. Keep area dry between applications. Onyx  g 0  . levothyroxine (SYNTHROID, LEVOTHROID) 150 MCG tablet Take 1 tablet (150 mcg total) by mouth daily before breakfast. 30 tablet 2  . lidocaine-prilocaine (EMLA) cream Apply 1 application topically as needed. 30 g 6  . loratadine (CLARITIN) 10 MG tablet Take 10 mg by mouth daily.    . methocarbamol (ROBAXIN) 500 MG tablet Take 1 tablet (500 mg total) by mouth every 8 (eight) hours as needed for muscle spasms. 8 tablet 0  . metoprolol succinate (TOPROL-XL) 50 MG 24 hr tablet Take 1 tablet (50 mg total) by mouth daily. Take with or immediately following a meal. 30 tablet 5  . miconazole (MICOTIN) 2 % powder Apply topically 2 (two) times daily. Apply to area of rash over the perineum 85 g 1  . mupirocin cream (BACTROBAN) 2 % Apply 1 application topically 2 (two) times daily. 30 g 0  . mupirocin ointment (BACTROBAN) 2 % Place 1 application into the nose 2 (two) times daily. 22 g 1  . naproxen (NAPROSYN) 500 MG tablet Take 1 tablet (500 mg total) by mouth 2 (two) times daily with a meal. 20 tablet 0  . omeprazole (PRILOSEC) 20 MG capsule Take 1 capsule (20 mg total) by mouth daily. 30 capsule 1  . oxyCODONE-acetaminophen (PERCOCET) 5-325 MG tablet Take 1 tablet by mouth every 6 (six) hours as needed for moderate pain or severe pain. 50 tablet 0  . polyethylene  glycol (MIRALAX) packet Take 17 g by mouth daily. 30 each 1  . senna-docusate (SENNA S) 8.6-50 MG tablet Take 2 tablets by mouth 2 (two) times daily. May reduce to 2 tab po HS as needed once regular BM estabished (Patient taking differently: Take 2 tablets by mouth 2 (two) times daily as needed (FOR CONSTIPATION). =) 60 tablet 1  . traZODone (DESYREL) 50 MG tablet Take 1-2 tablets (50-100 mg total) by mouth at bedtime as needed for sleep. 60 tablet 0   No current facility-administered medications for this visit.    Facility-Administered Medications Ordered in Other Visits  Medication Dose Route Frequency Provider Last Rate Last Dose  . sodium chloride 0.9 % injection 10 mL  10 mL Intravenous PRN [name], MD   10 mL at 05/24/16 1252    REVIEW OF SYSTEMS:   A 10+ POINT REVIEW OF SYSTEMS WAS OBTAINED including neurology, dermatology, psychiatry, cardiac, respiratory, lymph, extremities, GI, GU, Musculoskeletal, constitutional, breasts, reproductive, HEENT.  All pertinent positives are noted in the HPI.  All others are negative.   PHYSICAL EXAMINATION: ECOG PERFORMANCE STATUS: 1 - Symptomatic but completely ambulatory  Vitals:   10/17/17 0922  BP: 115/78  Pulse: (!) 107  Resp: 18  Temp: 98.4 F (36.9 C)  SpO2: 98%   Filed Weights   10/17/17 0922  Weight: 275 lb 6.4 oz (124.9 kg)   .Body mass index is 40.67 kg/m.  GENERAL:alert, in no acute distress and comfortable SKIN: no acute rashes, no significant lesions EYES: conjunctiva are pink and non-injected, sclera anicteric OROPHARYNX: MMM, no exudates, no oropharyngeal erythema or ulceration NECK: supple, no JVD LYMPH:  no palpable lymphadenopathy in the cervical, axillary or inguinal regions LUNGS: clear to auscultation b/l with normal respiratory effort HEART: regular rate & rhythm ABDOMEN:  normoactive bowel sounds , non tender, not distended. No palpable hepatosplenomegaly.  Extremity: no pedal edema PSYCH: alert  & oriented x 3 with fluent speech NEURO: no focal motor/sensory deficits    LABORATORY DATA: .  [name] CBC Latest Ref Rng &  Units 10/17/2017 10/03/2017 09/19/2017  WBC 3.9 - 10.3 K/uL 6.4 5.4 5.4  Hemoglobin 11.6 - 15.9 g/dL 10.4(L) 10.3(L) 10.3(L)  Hematocrit 34.8 - 46.6 % 32.2(L) 31.4(L) 31.5(L)  Platelets 145 - 400 K/uL 309 253 272  HGB 9.5  . CMP Latest Ref Rng & Units 10/17/2017 10/03/2017 09/19/2017  Glucose 70 - 99 mg/dL 86 312(H) 362(H)  BUN 6 - 20 mg/dL '8 10 12  ' Creatinine 0.44 - 1.00 mg/dL 0.74 0.78 0.80  Sodium 135 - 145 mmol/L 144 138 137  Potassium 3.5 - 5.1 mmol/L 3.9 3.9 4.1  Chloride 98 - 111 mmol/L 105 103 102  CO2 22 - 32 mmol/L '30 28 26  ' Calcium 8.9 - 10.3 mg/dL 9.3 9.0 9.0  Total Protein 6.5 - 8.1 g/dL 7.2 6.9 7.3  Total Bilirubin 0.3 - 1.2 mg/dL 0.3 0.2(L) 0.2  Alkaline Phos 38 - 126 U/L 93 87 101  AST 15 - 41 U/L 14(L) 11(L) 12  ALT 0 - 44 U/L '12 11 8               ' 08/18/17 Bx:   08/16/17 Molecular Pathology:    RADIOGRAPHIC STUDIES:  .Mm Diag Breast Tomo Bilateral  Result Date: 09/28/2017 CLINICAL DATA:  Patient describes diffuse bilateral breast pain, RIGHT greater than LEFT. History of metastatic lung cancer. EXAM: DIGITAL DIAGNOSTIC BILATERAL MAMMOGRAM WITH CAD AND TOMO COMPARISON:  Previous exam(s). ACR Breast Density Category b: There are scattered areas of fibroglandular density. FINDINGS: There are no dominant masses, suspicious calcifications or secondary signs of malignancy identified within either breast. RIGHT chest wall Port-A-Cath in place. Mammographic images were processed with CAD. IMPRESSION: No evidence of malignancy within either breast. RECOMMENDATION: 1.  Screening mammogram in one year.(Code:SM-B-01Y) 2. Benign causes of breast pain, and possible remedies, were discussed with the patient. Patient was encouraged to follow-up with referring physician if pain became localized and persistent or if a palpable lump/mass developed. I have  discussed the findings and recommendations with the patient. Results were also provided in writing at the conclusion of the visit. If applicable, a reminder letter will be sent to the patient regarding the next appointment. BI-RADS CATEGORY  1: Negative. Electronically Signed   By: Franki Cabot M.D.   On: 09/28/2017 09:57     ASSESSMENT & PLAN:   52 y.o.  African-American female with history of hypertension, diabetes, asthma, dysuria mass and morbid obesity with    #1 h/o Lung Adenocarcinoma Rt sided at least Stage IIIB (on diagnosis) with large right paratracheal mass with mediastinal adenopathy that appears to have grown significantly over the last 6-7 months and rt supraclavicular LN +.  -Noted to have a small mass in the left adrenal on CT but PET/CT neg for metastatic disease. Patient has been a lifelong nonsmoker -MRI of the brain was negative for any metastatic disease. At diagnosis. -High PDL1 expression (90%) on foundation One Neg for EGFR, ALK, ROS-1 and BRAF mutations. -Patient completed her planned definitive chemoradiation with Carbo Taxol on 02/13/2016. No prohibitive toxicities other than some grade 1 skin desquamation and some grade 1-2 radiation esophagitis. She has subsequently received 2 out of 2 planned dose of carboplatin + Taxol. -Patient has completed 11 doses of maintenance   -CTA chest 10/29/2016  no evidence of lung cancer progression in the chest. No PE -CT abd/pelvis-10/29/2016  Interval 5.0 x 5.0 x 4.7 cm mass in the right lateral subcutaneous fat at the level of the mid abdomen. This has CT features compatible  with a metastasis or primary neoplasm. -CT C/A/P (05/12/2017): Interval development of right axillary, right external iliac and right inguinal adenopathy. Suspicious for nodal metastasis. 2. Decrease in size of right paratracheal adenopathy. 3. Decrease in size and enhancement associated with right lateral body wall lesion. 4. New small nonspecific pulmonary  nodule in the left lower lobe measuring 6 mm. 5. Stable appearance of changes secondary to external beam radiation within the right lung  08/03/17 PET which revealed 2.8 cm right axillary lymph node is markedly hypermetabolic and consistent with metastatic disease. Extensive radiation changes involving the right paramediastinal lung. There is a focus of hypermetabolism in the right lower lobe which is suspicious for residual or recurrent tumor.   #2 Now with Metastatic lung adenocarcinoma with isolated metastases in the right lateral subcutaneous fat at the level of the midabdomen. she received maintenance Durvalumab from  05/24/2016 q2weeks, switched to Nivolumab q2weeks on 12/13/16 in the setting of Biopsy proven isolated metastatic disease. Given the tumor is strongly PDL1 positive and patient has only isolated metastatic disease was treated with palliative RT to metastases with significant improvement. Molecular pathology from 515/19 did not reveal obviously targetable mutations with NF1 and TP53 mutations present.  Plan:  -Discussed pt labwork today, 10/17/17; HGB stable at 10.4, PLT normal at 309k, WBC normal at 6.4k -Given continued right flank/abdominal discomfort, will repeat CT C/A/P in 3 weeks -The pt has no prohibitive toxicities from continuing Nivolumab every 2 weeks, at this time.  -No overt clinical symptoms of overt progression at this time  #3 h/o radiation pneumonitis - no new symptoms. Does has some cough with exertion but this has improved.  #4 Improving fatigue (grade 1) - improved fatigue with levothyroxine compliance with near normalization of FT4 levels.  - previously given TSH of 12 -- increased Levolthyroxine from 134mg to 154m  ---will likely need larger dose but in the setting of baseline tachycardia we are gradually increasing dose to avoid ppt cardiac arrhythmias.  -TSH 08/08/17 was at 3.992  #5 normocytic anemia related to chemotherapy -resolving and stable, HGB at  10.4  #6 Left chest wall pain due to costochondritis - mx with pain medications. CTA x 3 neg for PE. No significant pain at this time.  #7 Sinus tachycardia - on BB per cards , no PE on CTA x 3 , -Still having sinus tachycardia . Partly from deconditioning and anemia. Seeing Dr. HiDebara Pickettrom cardiology  -Continues to be on Coreg  #8 neoplasm related pain is currently controlled without significant medications. Clinically likely has sleep apnea . Would need to be careful with sedative medications . 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 -trazodone 50-100m22m HS prn   -continue Nivolumab q2weeks with labs as ordered -port flush q2weeks for labs CT chest/abd/pelvis in 3 weeks RTC with Dr KaleIrene Limbo4 weeks with labs    All of the patients questions were answered with apparent satisfaction. The patient knows to call the clinic with any problems, questions or concerns.  The total time spent in the appt was 20 minutes and more than 50% was on counseling and direct patient cares.   GautSullivan LoneMS AVine GroveIVMS SCH [facility]/Oncology Physician ConeGlenffice):  [phone] (Work cell):  [phone] (Fax):           [phone]  I, Baldwin Jamaica, am acting as a scribe for Dr Irene Limbo.   .I have reviewed the above documentation for accuracy and completeness, and I agree with the above. [name] MD

## 2017-10-17 ENCOUNTER — Inpatient Hospital Stay: Payer: Medicaid Other

## 2017-10-17 ENCOUNTER — Encounter: Payer: Self-pay | Admitting: Hematology

## 2017-10-17 ENCOUNTER — Inpatient Hospital Stay (HOSPITAL_BASED_OUTPATIENT_CLINIC_OR_DEPARTMENT_OTHER): Payer: Medicaid Other | Admitting: Hematology

## 2017-10-17 VITALS — BP 115/78 | HR 107 | Temp 98.4°F | Resp 18 | Ht 69.0 in | Wt 275.4 lb

## 2017-10-17 DIAGNOSIS — C3491 Malignant neoplasm of unspecified part of right bronchus or lung: Secondary | ICD-10-CM

## 2017-10-17 DIAGNOSIS — I10 Essential (primary) hypertension: Secondary | ICD-10-CM

## 2017-10-17 DIAGNOSIS — Z79899 Other long term (current) drug therapy: Secondary | ICD-10-CM

## 2017-10-17 DIAGNOSIS — G893 Neoplasm related pain (acute) (chronic): Secondary | ICD-10-CM

## 2017-10-17 DIAGNOSIS — Z95828 Presence of other vascular implants and grafts: Secondary | ICD-10-CM

## 2017-10-17 DIAGNOSIS — E7800 Pure hypercholesterolemia, unspecified: Secondary | ICD-10-CM

## 2017-10-17 DIAGNOSIS — M129 Arthropathy, unspecified: Secondary | ICD-10-CM

## 2017-10-17 DIAGNOSIS — Z5112 Encounter for antineoplastic immunotherapy: Secondary | ICD-10-CM | POA: Diagnosis not present

## 2017-10-17 DIAGNOSIS — R000 Tachycardia, unspecified: Secondary | ICD-10-CM

## 2017-10-17 DIAGNOSIS — Z9221 Personal history of antineoplastic chemotherapy: Secondary | ICD-10-CM

## 2017-10-17 DIAGNOSIS — Z923 Personal history of irradiation: Secondary | ICD-10-CM

## 2017-10-17 DIAGNOSIS — R51 Headache: Secondary | ICD-10-CM

## 2017-10-17 DIAGNOSIS — I429 Cardiomyopathy, unspecified: Secondary | ICD-10-CM

## 2017-10-17 DIAGNOSIS — G4700 Insomnia, unspecified: Secondary | ICD-10-CM

## 2017-10-17 DIAGNOSIS — E669 Obesity, unspecified: Secondary | ICD-10-CM

## 2017-10-17 DIAGNOSIS — Z7982 Long term (current) use of aspirin: Secondary | ICD-10-CM

## 2017-10-17 DIAGNOSIS — C792 Secondary malignant neoplasm of skin: Secondary | ICD-10-CM

## 2017-10-17 DIAGNOSIS — M940 Chondrocostal junction syndrome [Tietze]: Secondary | ICD-10-CM

## 2017-10-17 DIAGNOSIS — E1165 Type 2 diabetes mellitus with hyperglycemia: Secondary | ICD-10-CM

## 2017-10-17 DIAGNOSIS — G629 Polyneuropathy, unspecified: Secondary | ICD-10-CM

## 2017-10-17 DIAGNOSIS — Z452 Encounter for adjustment and management of vascular access device: Secondary | ICD-10-CM

## 2017-10-17 DIAGNOSIS — D649 Anemia, unspecified: Secondary | ICD-10-CM | POA: Diagnosis not present

## 2017-10-17 DIAGNOSIS — E039 Hypothyroidism, unspecified: Secondary | ICD-10-CM

## 2017-10-17 DIAGNOSIS — Z5111 Encounter for antineoplastic chemotherapy: Secondary | ICD-10-CM | POA: Diagnosis not present

## 2017-10-17 DIAGNOSIS — Z794 Long term (current) use of insulin: Secondary | ICD-10-CM

## 2017-10-17 LAB — COMPREHENSIVE METABOLIC PANEL
ALT: 12 U/L (ref 0–44)
ANION GAP: 9 (ref 5–15)
AST: 14 U/L — ABNORMAL LOW (ref 15–41)
Albumin: 3.1 g/dL — ABNORMAL LOW (ref 3.5–5.0)
Alkaline Phosphatase: 93 U/L (ref 38–126)
BILIRUBIN TOTAL: 0.3 mg/dL (ref 0.3–1.2)
BUN: 8 mg/dL (ref 6–20)
CALCIUM: 9.3 mg/dL (ref 8.9–10.3)
CO2: 30 mmol/L (ref 22–32)
Chloride: 105 mmol/L (ref 98–111)
Creatinine, Ser: 0.74 mg/dL (ref 0.44–1.00)
GFR calc Af Amer: >60 mL/min (ref >60)
Glucose, Bld: 86 mg/dL (ref 70–99)
Potassium: 3.9 mmol/L (ref 3.5–5.1)
Sodium: 144 mmol/L (ref 135–145)
TOTAL PROTEIN: 7.2 g/dL (ref 6.5–8.1)

## 2017-10-17 LAB — CBC WITH DIFFERENTIAL/PLATELET
BASOS PCT: 0 %
Basophils Absolute: 0 10*3/uL (ref 0.0–0.1)
Eosinophils Absolute: 0.4 10*3/uL (ref 0.0–0.5)
Eosinophils Relative: 6 %
HCT: 32.2 % — ABNORMAL LOW (ref 34.8–46.6)
HEMOGLOBIN: 10.4 g/dL — AB (ref 11.6–15.9)
Lymphocytes Relative: 30 %
Lymphs Abs: 2 10*3/uL (ref 0.9–3.3)
MCH: 28.1 pg (ref 25.1–34.0)
MCHC: 32.3 g/dL (ref 31.5–36.0)
MCV: 87 fL (ref 79.5–101.0)
MONOS PCT: 5 %
Monocytes Absolute: 0.3 10*3/uL (ref 0.1–0.9)
NEUTROS PCT: 59 %
Neutro Abs: 3.8 10*3/uL (ref 1.5–6.5)
Platelets: 309 10*3/uL (ref 145–400)
RBC: 3.7 MIL/uL (ref 3.70–5.45)
RDW: 13.3 % (ref 11.2–14.5)
WBC: 6.4 10*3/uL (ref 3.9–10.3)

## 2017-10-17 LAB — RETICULOCYTES
RBC.: 3.7 MIL/uL (ref 3.70–5.45)
Retic Count, Absolute: 66.6 10*3/uL (ref 33.7–90.7)
Retic Ct Pct: 1.8 % (ref 0.7–2.1)

## 2017-10-17 LAB — TSH: TSH: 1.374 u[IU]/mL (ref 0.308–3.960)

## 2017-10-17 IMAGING — MR MR HEAD WO/W CM
10 of 13 series · 35 of 48 positions shown · IV contrast (multihance)
Comparison: Brain MRI 12/26/2015

CLINICAL DATA: Non-small-cell lung cancer staging.

EXAM:
MRI HEAD WITHOUT AND WITH CONTRAST
TECHNIQUE: Multiplanar, multiecho pulse sequences of the brain and surrounding
structures were obtained without and with intravenous contrast.
CONTRAST:  20mL MULTIHANCE GADOBENATE DIMEGLUMINE 529 MG/ML IV SOLN

[Series 3: DWI · axial · 3.0mm · 1.09mm/px · z∈[-34,+113]mm · 9 of 100 slices shown (1 of 4)]
[im 1/100]
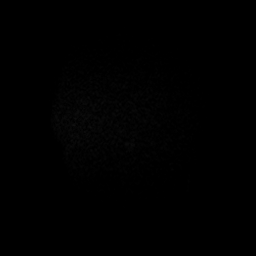
[im 13/100]
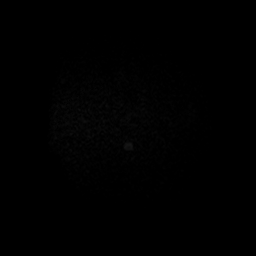
[im 25/100]
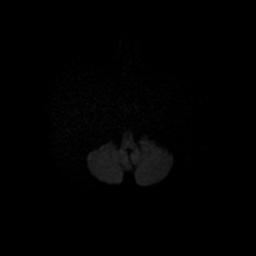
[im 38/100]
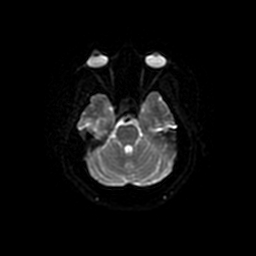
[im 50/100]
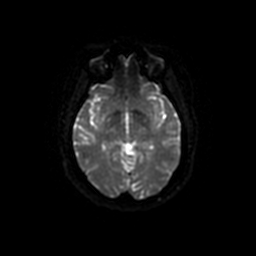
[im 62/100]
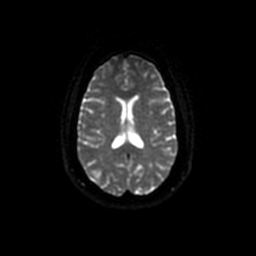
[im 75/100]
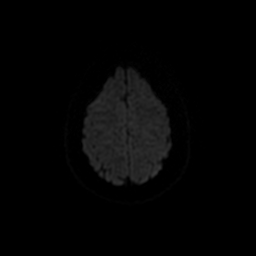
[im 87/100]
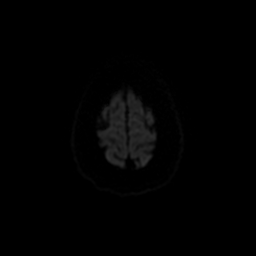
[im 100/100]
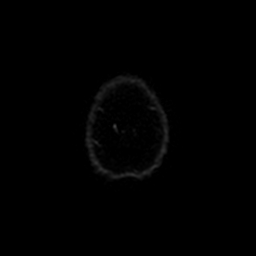

[Series 4: T1 · sagittal · 5.0mm · 0.47mm/px · 1 of 24 slices shown]
[im 1/24]
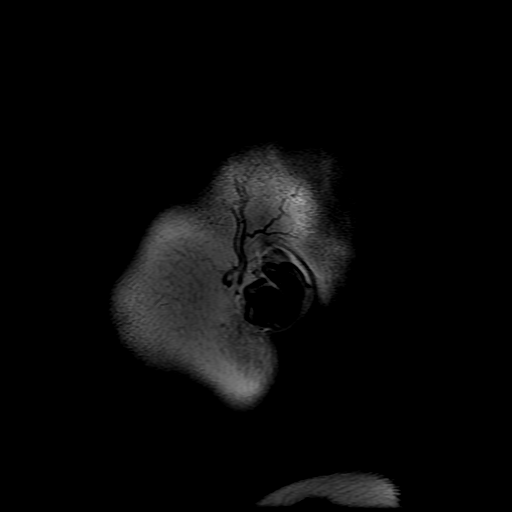

[Series 5: DWI · coronal · 5.0mm · 1.09mm/px · 6 of 62 slices shown (2 of 4)]
[im 1/62]
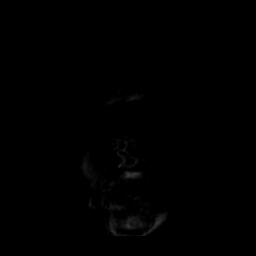
[im 13/62]
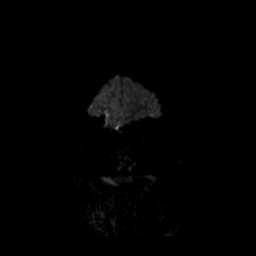
[im 25/62]
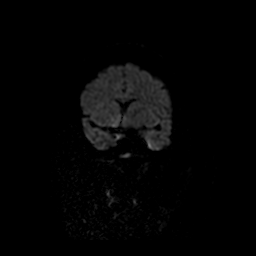
[im 37/62]
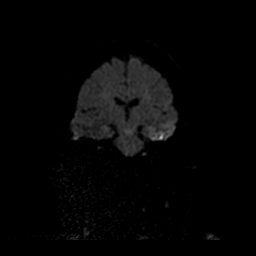
[im 49/62]
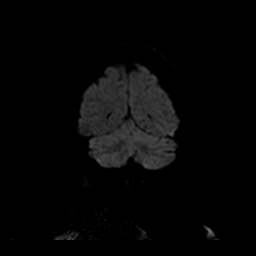
[im 62/62]
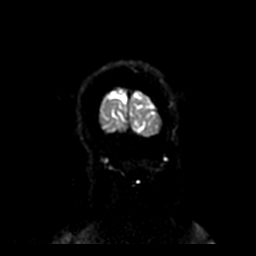

[Series 6: T2 · axial · 5.0mm · 0.43mm/px · z∈[-52,+115]mm · 2 of 25 slices shown]
[im 1/25]
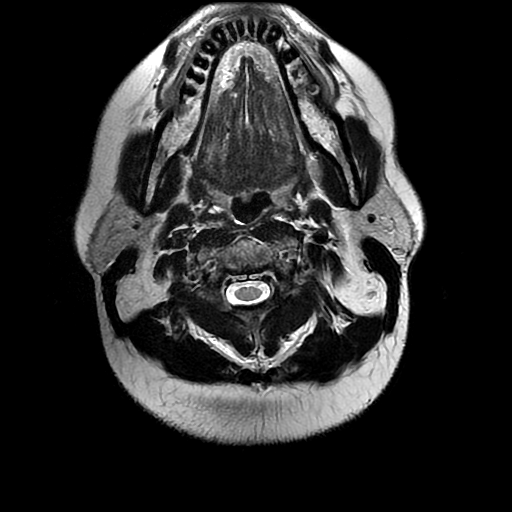
[im 25/25]
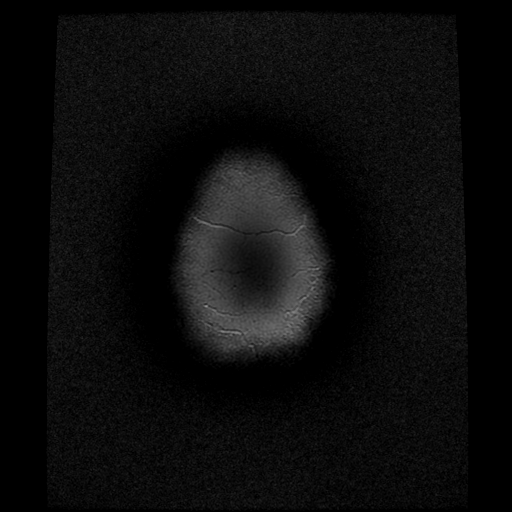

[Series 7: FLAIR · axial · 3.0mm · 0.43mm/px · z∈[-55,+118]mm · 3 of 30 slices shown]
[im 1/30]
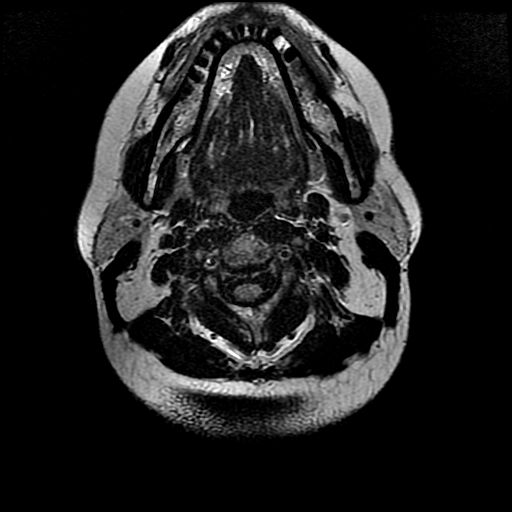
[im 15/30]
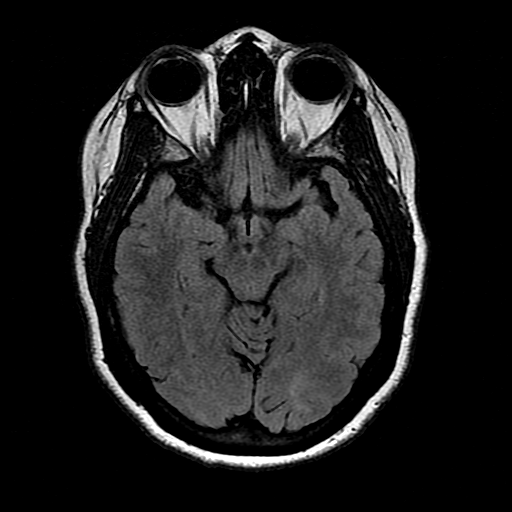
[im 30/30]
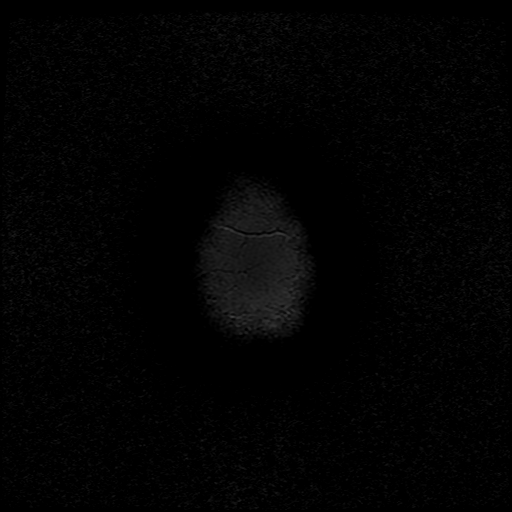

[Series 10: T2 post-contrast · coronal · 5.0mm · 0.45mm/px · 2 of 25 slices shown]
[im 1/25]
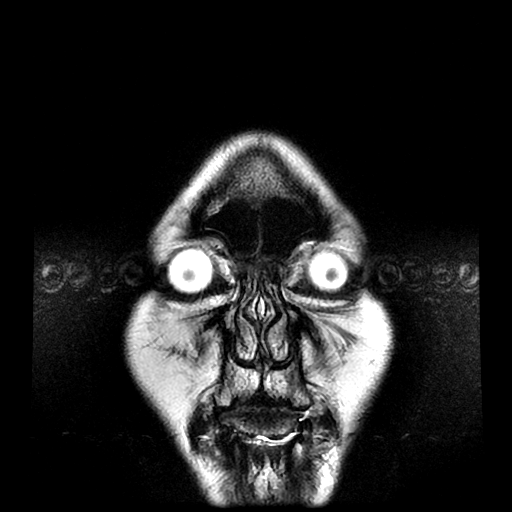
[im 25/25]
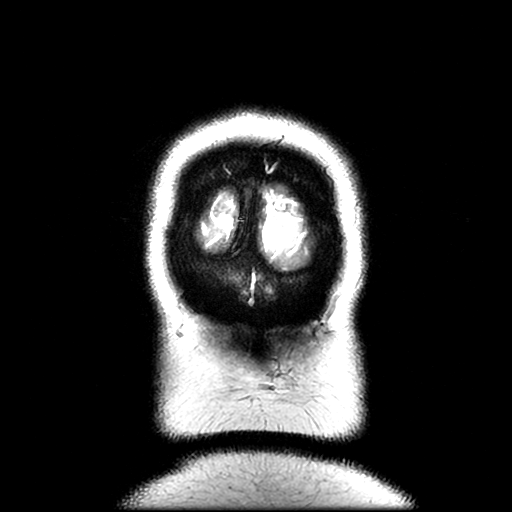

[Series 12: T1 post-contrast · coronal · 5.0mm · 0.45mm/px · 2 of 25 slices shown (1 of 2)]
[im 1/25]
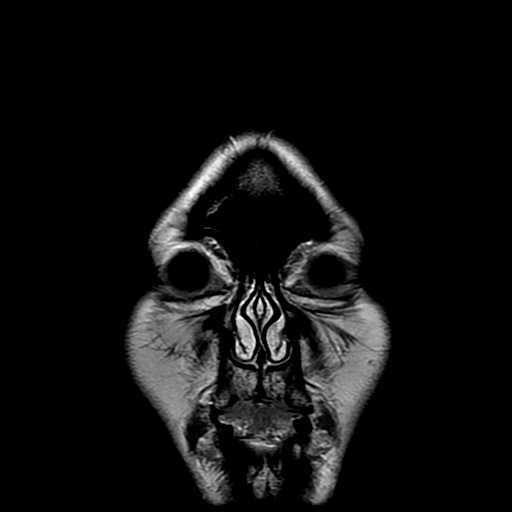
[im 25/25]
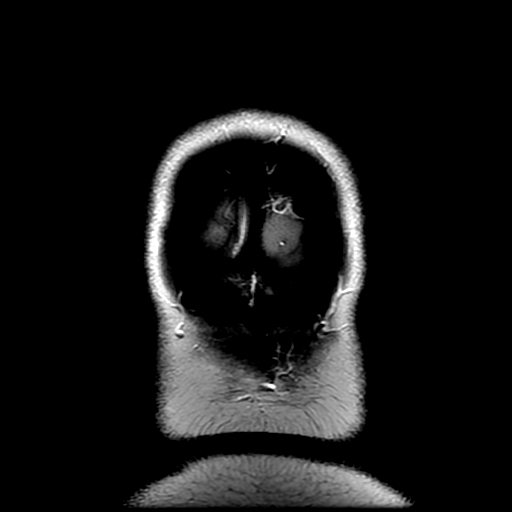

[Series 13: T1 post-contrast · sagittal · 5.0mm · 0.47mm/px · 2 of 24 slices shown (2 of 2)]
[im 1/24]
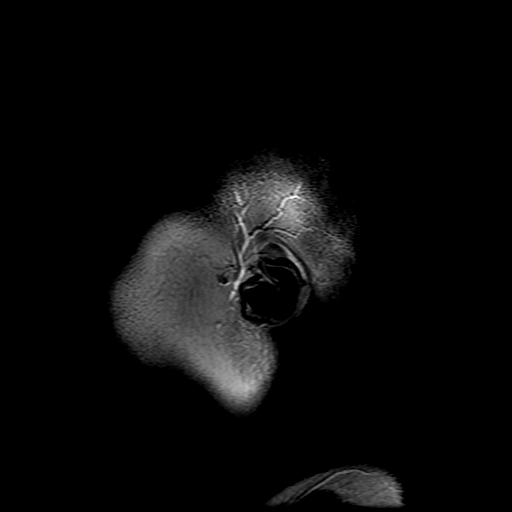
[im 24/24]
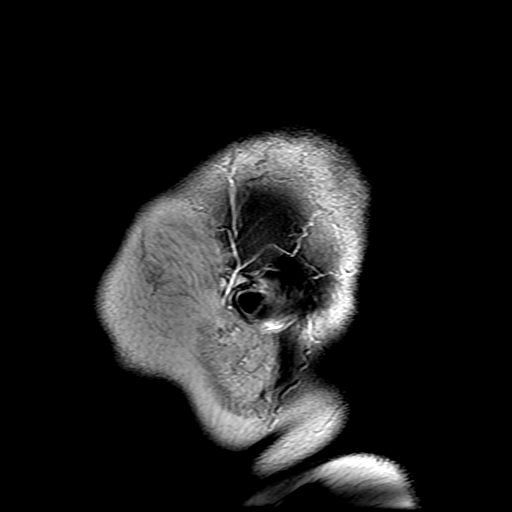

[Series 300: DWI · axial · 3.0mm · 1.09mm/px · z∈[-34,+113]mm · 5 of 50 slices shown (3 of 4)]
[im 1/50]
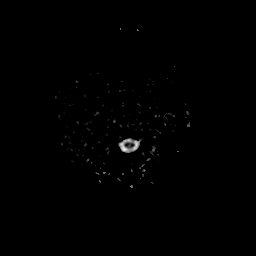
[im 13/50]
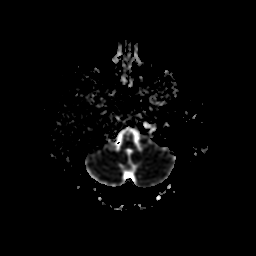
[im 25/50]
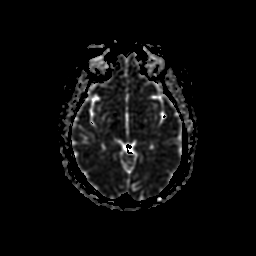
[im 37/50]
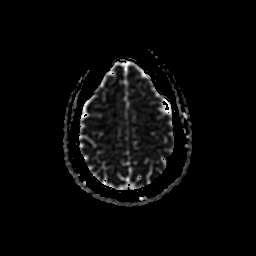
[im 50/50]
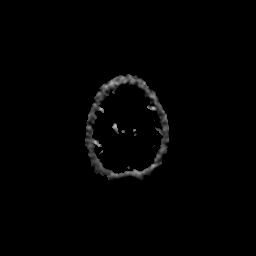

[Series 500: DWI · coronal · 5.0mm · 1.09mm/px · 3 of 31 slices shown (4 of 4)]
[im 1/31]
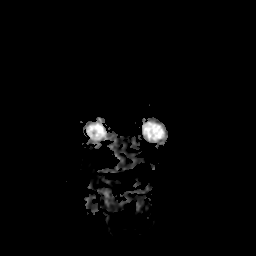
[im 16/31]
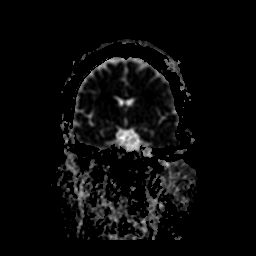
[im 31/31]
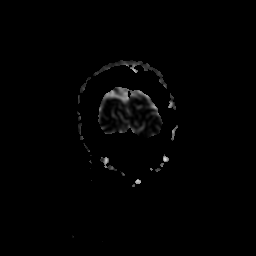

[35 of 48 positions shown; findings below may reference images not displayed]

FINDINGS: Brain: The midline structures are normal. There is no focal
diffusion restriction to indicate acute infarct. Scattered white
matter hyperintensities, slightly greater than expected at this age.
No intraparenchymal hematoma or chronic microhemorrhage. Brain
volume is normal for age without lobar predominant atrophy. The dura
is normal and there is no extra-axial collection.

Vascular: Major intracranial arterial and venous sinus flow voids
are preserved.

Skull and upper cervical spine: The visualized skull base,
calvarium, upper cervical spine and extracranial soft tissues are
normal.

Sinuses/Orbits: No fluid levels or advanced mucosal thickening. No
mastoid or middle ear effusion. Normal orbits.
IMPRESSION: No intracranial or calvarial metastatic disease.

## 2017-10-17 MED ORDER — CLOTRIMAZOLE 1 % EX CREA: 1.0000 "application " | TOPICAL_CREAM | Freq: Two times a day (BID) | CUTANEOUS | 0 refills | Status: DC

## 2017-10-17 MED ORDER — CLINDAMYCIN PHOSPHATE 1 % EX GEL: Freq: Two times a day (BID) | CUTANEOUS | 0 refills | Status: DC

## 2017-10-17 MED ORDER — SODIUM CHLORIDE 0.9 % IV SOLN
Freq: Once | INTRAVENOUS | Status: AC
  Administered 2017-10-17: 10:00:00 via INTRAVENOUS

## 2017-10-17 MED ORDER — SODIUM CHLORIDE 0.9 % IJ SOLN
10.0000 mL | INTRAMUSCULAR | Status: DC | PRN
  Administered 2017-10-17: 10 mL via INTRAVENOUS
  Filled 2017-10-17: qty 10

## 2017-10-17 MED ORDER — SODIUM CHLORIDE 0.9% FLUSH
10.0000 mL | INTRAVENOUS | Status: DC | PRN
  Administered 2017-10-17: 10 mL
  Filled 2017-10-17: qty 10

## 2017-10-17 MED ORDER — HEPARIN SOD (PORK) LOCK FLUSH 100 UNIT/ML IV SOLN
500.0000 [IU] | Freq: Once | INTRAVENOUS | Status: AC | PRN
  Administered 2017-10-17: 500 [IU]
  Filled 2017-10-17: qty 5

## 2017-10-17 MED ORDER — NIVOLUMAB CHEMO INJECTION 100 MG/10ML
240.0000 mg | Freq: Once | INTRAVENOUS | Status: AC
  Administered 2017-10-17: 240 mg via INTRAVENOUS
  Filled 2017-10-17: qty 24

## 2017-10-17 NOTE — Patient Instructions (Signed)
[facility] Discharge Instructions for Patients Receiving Chemotherapy  Today you received the following chemotherapy agents Opdivo  To help prevent nausea and vomiting after your treatment, we encourage you to take your nausea medication as directed   If you develop nausea and vomiting that is not controlled by your nausea medication, call the clinic.   BELOW ARE SYMPTOMS THAT SHOULD BE REPORTED IMMEDIATELY:  *FEVER GREATER THAN 100.5 F  *CHILLS WITH OR WITHOUT FEVER  NAUSEA AND VOMITING THAT IS NOT CONTROLLED WITH YOUR NAUSEA MEDICATION  *UNUSUAL SHORTNESS OF BREATH  *UNUSUAL BRUISING OR BLEEDING  TENDERNESS IN MOUTH AND THROAT WITH OR WITHOUT PRESENCE OF ULCERS  *URINARY PROBLEMS  *BOWEL PROBLEMS  UNUSUAL RASH Items with * indicate a potential emergency and should be followed up as soon as possible.  Feel free to call the clinic should you have any questions or concerns. The clinic phone number is [phone].  Please show the CHEMO ALERT CARD at check-in to the Emergency Department and triage nurse.   

## 2017-10-18 ENCOUNTER — Telehealth: Payer: Self-pay

## 2017-10-18 NOTE — Telephone Encounter (Signed)
Left a detailed message concerning patients upcoming appointments. Informed patient of the CT scan that she needs to come by the office and pick up contrast. Mailed letter, calender, and ct information. Per 7/16 los

## 2017-10-31 ENCOUNTER — Inpatient Hospital Stay: Payer: Medicaid Other

## 2017-10-31 ENCOUNTER — Other Ambulatory Visit: Payer: Self-pay | Admitting: Hematology

## 2017-10-31 VITALS — BP 129/87 | HR 99 | Temp 98.4°F | Resp 18

## 2017-10-31 DIAGNOSIS — C792 Secondary malignant neoplasm of skin: Secondary | ICD-10-CM

## 2017-10-31 DIAGNOSIS — Z95828 Presence of other vascular implants and grafts: Secondary | ICD-10-CM

## 2017-10-31 DIAGNOSIS — C3491 Malignant neoplasm of unspecified part of right bronchus or lung: Secondary | ICD-10-CM

## 2017-10-31 DIAGNOSIS — Z5111 Encounter for antineoplastic chemotherapy: Secondary | ICD-10-CM | POA: Diagnosis not present

## 2017-10-31 LAB — COMPREHENSIVE METABOLIC PANEL
ALT: 10 U/L (ref 0–44)
ANION GAP: 7 (ref 5–15)
AST: 14 U/L — AB (ref 15–41)
Albumin: 2.8 g/dL — ABNORMAL LOW (ref 3.5–5.0)
Alkaline Phosphatase: 87 U/L (ref 38–126)
BILIRUBIN TOTAL: 0.3 mg/dL (ref 0.3–1.2)
BUN: 6 mg/dL (ref 6–20)
CO2: 25 mmol/L (ref 22–32)
Calcium: 7.9 mg/dL — ABNORMAL LOW (ref 8.9–10.3)
Chloride: 111 mmol/L (ref 98–111)
Creatinine, Ser: 0.65 mg/dL (ref 0.44–1.00)
GFR calc Af Amer: >60 mL/min (ref >60)
Glucose, Bld: 163 mg/dL — ABNORMAL HIGH (ref 70–99)
POTASSIUM: 3.7 mmol/L (ref 3.5–5.1)
Sodium: 143 mmol/L (ref 135–145)
TOTAL PROTEIN: 6.5 g/dL (ref 6.5–8.1)

## 2017-10-31 LAB — CBC WITH DIFFERENTIAL (CANCER CENTER ONLY)
BASOS PCT: 0 %
Basophils Absolute: 0 10*3/uL (ref 0.0–0.1)
EOS ABS: 0.3 10*3/uL (ref 0.0–0.5)
Eosinophils Relative: 5 %
HCT: 26.2 % — ABNORMAL LOW (ref 34.8–46.6)
HEMOGLOBIN: 8.3 g/dL — AB (ref 11.6–15.9)
Lymphocytes Relative: 26 %
Lymphs Abs: 1.4 10*3/uL (ref 0.9–3.3)
MCH: 27.6 pg (ref 25.1–34.0)
MCHC: 31.7 g/dL (ref 31.5–36.0)
MCV: 87 fL (ref 79.5–101.0)
MONOS PCT: 5 %
Monocytes Absolute: 0.3 10*3/uL (ref 0.1–0.9)
NEUTROS PCT: 64 %
Neutro Abs: 3.5 10*3/uL (ref 1.5–6.5)
Platelet Count: 277 10*3/uL (ref 145–400)
RBC: 3.01 MIL/uL — AB (ref 3.70–5.45)
RDW: 13.4 % (ref 11.2–14.5)
WBC: 5.4 10*3/uL (ref 3.9–10.3)

## 2017-10-31 LAB — RETICULOCYTES
RBC.: 3.01 MIL/uL — AB (ref 3.70–5.45)
RETIC CT PCT: 1.8 % (ref 0.7–2.1)
Retic Count, Absolute: 54.2 10*3/uL (ref 33.7–90.7)

## 2017-10-31 MED ORDER — HEPARIN SOD (PORK) LOCK FLUSH 100 UNIT/ML IV SOLN
500.0000 [IU] | Freq: Once | INTRAVENOUS | Status: AC | PRN
  Administered 2017-10-31: 500 [IU]
  Filled 2017-10-31: qty 5

## 2017-10-31 MED ORDER — NIVOLUMAB CHEMO INJECTION 100 MG/10ML
240.0000 mg | Freq: Once | INTRAVENOUS | Status: AC
  Administered 2017-10-31: 240 mg via INTRAVENOUS
  Filled 2017-10-31: qty 24

## 2017-10-31 MED ORDER — SODIUM CHLORIDE 0.9% FLUSH
10.0000 mL | INTRAVENOUS | Status: DC | PRN
  Administered 2017-10-31: 10 mL
  Filled 2017-10-31: qty 10

## 2017-10-31 MED ORDER — SODIUM CHLORIDE 0.9 % IV SOLN
Freq: Once | INTRAVENOUS | Status: AC
  Administered 2017-10-31: 13:00:00 via INTRAVENOUS
  Filled 2017-10-31: qty 250

## 2017-10-31 MED ORDER — SODIUM CHLORIDE 0.9% FLUSH
10.0000 mL | INTRAVENOUS | Status: DC | PRN
  Administered 2017-10-31: 10 mL via INTRAVENOUS
  Filled 2017-10-31: qty 10

## 2017-10-31 NOTE — Progress Notes (Signed)
Dr. Irene Limbo ok to treat before labs resulted on 10/31/17.

## 2017-10-31 NOTE — Patient Instructions (Signed)
[facility] Discharge Instructions for Patients Receiving Chemotherapy  Today you received the following chemotherapy agents Opdivo  To help prevent nausea and vomiting after your treatment, we encourage you to take your nausea medication as directed   If you develop nausea and vomiting that is not controlled by your nausea medication, call the clinic.   BELOW ARE SYMPTOMS THAT SHOULD BE REPORTED IMMEDIATELY:  *FEVER GREATER THAN 100.5 F  *CHILLS WITH OR WITHOUT FEVER  NAUSEA AND VOMITING THAT IS NOT CONTROLLED WITH YOUR NAUSEA MEDICATION  *UNUSUAL SHORTNESS OF BREATH  *UNUSUAL BRUISING OR BLEEDING  TENDERNESS IN MOUTH AND THROAT WITH OR WITHOUT PRESENCE OF ULCERS  *URINARY PROBLEMS  *BOWEL PROBLEMS  UNUSUAL RASH Items with * indicate a potential emergency and should be followed up as soon as possible.  Feel free to call the clinic should you have any questions or concerns. The clinic phone number is [phone].  Please show the CHEMO ALERT CARD at check-in to the Emergency Department and triage nurse.   

## 2017-11-08 ENCOUNTER — Other Ambulatory Visit: Payer: Self-pay | Admitting: Hematology

## 2017-11-11 ENCOUNTER — Ambulatory Visit (HOSPITAL_COMMUNITY)
Admission: RE | Admit: 2017-11-11 | Discharge: 2017-11-11 | Disposition: A | Discharge status: Home / Self Care | Payer: Medicaid Other | Source: Ambulatory Visit | Attending: Hematology | Admitting: Hematology

## 2017-11-11 DIAGNOSIS — C3491 Malignant neoplasm of unspecified part of right bronchus or lung: Secondary | ICD-10-CM | POA: Diagnosis not present

## 2017-11-11 DIAGNOSIS — R591 Generalized enlarged lymph nodes: Secondary | ICD-10-CM | POA: Insufficient documentation

## 2017-11-11 DIAGNOSIS — Z923 Personal history of irradiation: Secondary | ICD-10-CM | POA: Insufficient documentation

## 2017-11-11 DIAGNOSIS — K8020 Calculus of gallbladder without cholecystitis without obstruction: Secondary | ICD-10-CM | POA: Diagnosis not present

## 2017-11-11 DIAGNOSIS — I700 Atherosclerosis of aorta: Secondary | ICD-10-CM | POA: Diagnosis not present

## 2017-11-11 MED ORDER — IOHEXOL 300 MG/ML  SOLN
100.0000 mL | Freq: Once | INTRAMUSCULAR | Status: AC | PRN
  Administered 2017-11-11: 100 mL via INTRAVENOUS

## 2017-11-11 NOTE — Progress Notes (Signed)
[name]    HEMATOLOGY/ONCOLOGY CLINIC NOTE  Date of Service: 11/14/17     Patient Care Team: [name], FNP as PCP - General (Nurse Practitioner) Pixie Casino, MD as PCP - Cardiology (Cardiology)  CHIEF COMPLAINTS  F/u for continued management of metastatic lung adenocarcinoma  Diagnosis Metastatic Lung Adenocarcinoma with rt flank subcutaneous metastasis.  Current treatment:  Nivolumab q2 weeks  PreviousTreatment -Concurrent Chemo-radiation with Carboplatin/Taxol. Completed RT on 02/13/2016 and has then completed 2 cycles of carboplatin + taxol. -Started Maintenance Durvalumab from  05/24/2016 q2weeks, switched to Nivolumab q2weeks on 12/13/16 once metastatic disease noted -s/p palliative RT to rt sided Subcutaneous metastatic mass.    HISTORY OF PRESENTING ILLNESS: Plz see my previous note for details on initial presentation.  INTERVAL HISTORY   Marilyn [name] presents to the office today for followup of her metastatic lung cancer. The patient's last visit with [place] was on 10/17/17. The pt reports that she is doing well overall.   The pt reports some intermittent, diffuse pain from her right abdomen into her pelvis but denies pain in her groin. She notes that she is not having any leg swelling or difficulties breathing. She does continue to have stable chest wall pain to palpation.  She notes that she is walking more, has been eating well, and has good energy levels. She notes that her blood sugars have fluctuated.   Of note since the patient's last visit, pt has had CT C/A/P completed on 11/11/17 with results revealing There has been interval increase in number of enlarged of right axillary nodes and increase in size of right inguinal adenopathy compatible with progression of disease. Stable appearance of the right lung with changes of external beam radiation. Aortic Atherosclerosis. Gallstones.  Lab results today (11/14/17) of CBC w/diff is as follows: all values are WNL  except for RBC at 3.55, HGB at 10.1, HCT at 31.1. CMP 11/23/17 is stable  On review of systems, pt reports walking more, stable chest wall pain, intermittent and diffuse abdominopelvic pain, good energy levels, and denies pain in the groin, back pains, SOB, difficulty breathing, leg swelling, and any other symptoms.    MEDICAL HISTORY:  Past Medical History:  Diagnosis Date  . Arthritis   . Asthma   . Diabetes mellitus    Type II  . Hemorrhoids   . Hypercholesteremia   . Hypertension   . Hypothyroidism   . Metastatic lung cancer (metastasis from lung to other site) (HCC)    Lung, Mets to skin- right flank. CT angio 2019 suspicious for axillary, external iliac, right inguinal mets  . 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 NICM (nonischemic cardiomyopathy) ([place])    a. mild - EF 45-50% by echo 10/2016 but EF 52% by low risk nuc 11/2016. b. Echo 07/2017 - EF normal, mild AI, unremarkable echo otherwise.  . Obese   . Pneumonia 2017  . Sinus tachycardia    a. dates back to at least 2013, etiology unclear.    SURGICAL HISTORY: Past Surgical History:  Procedure Laterality Date  . CESAREAN SECTION    . COLONOSCOPY    . I/D  arm Right 2013  . IR FLUORO GUIDE PORT INSERTION RIGHT  02/18/2017  . IR GENERIC HISTORICAL  01/08/2016   IR US GUIDE VASC ACCESS RIGHT 01/08/2016 [name], DO [facility]  . IR GENERIC HISTORICAL  01/08/2016   IR FLUORO GUIDE CV LINE RIGHT 01/08/2016 [name], DO [facility]  . IR US GUIDE VASC ACCESS RIGHT  02/18/2017  . MULTIPLE EXTRACTIONS WITH ALVEOLOPLASTY N/A 03/25/2017   Procedure: MULTIPLE EXTRACTION WITH ALVEOLOPLASTY (teeth #s four, six, seven, eight, nine, ten, eleven, one, twenty-three, twenty-four, twenty-five, twenty-six, twenty-nine, thirty);  Surgeon: [name], [name];  Location: [facility];  Service: Oral Surgery;  Laterality: N/A;  . TUBAL LIGATION    . US guided core needle biopsy     of right lower neck/supraclavicular  lymph nodes    SOCIAL HISTORY: Social History   Socioeconomic History  . Marital status: Divorced    Spouse name: Not on file  . Number of children: Not on file  . Years of education: Not on file  . Highest education level: Not on file  Occupational History  . Not on file  Social Needs  . Financial resource strain: Not on file  . Food insecurity:    Worry: Not on file    Inability: Not on file  . Transportation needs:    Medical: Not on file    Non-medical: Not on file  Tobacco Use  . Smoking status: Never Smoker  . Smokeless tobacco: Never Used  Substance and Sexual Activity  . Alcohol use: No  . Drug use: No  . Sexual activity: Not Currently  Lifestyle  . Physical activity:    Days per week: Not on file    Minutes per session: Not on file  . Stress: Not on file  Relationships  . Social connections:    Talks on phone: Not on file    Gets together: Not on file    Attends religious service: Not on file    Active member of club or organization: Not on file    Attends meetings of clubs or organizations: Not on file    Relationship status: Not on file  . Intimate partner violence:    Fear of current or ex partner: Not on file    Emotionally abused: Not on file    Physically abused: Not on file    Forced sexual activity: Not on file  Other Topics Concern  . Not on file  Social History Narrative   No bird or mold exposure. No recent travel.    FAMILY HISTORY: Family History  Problem Relation Age of Onset  . Heart failure Mother   . Hypertension Mother   . Cancer Neg Hx   . Rheumatologic disease Neg Hx     ALLERGIES:  has No Known Allergies.  MEDICATIONS:  Current Outpatient Medications  Medication Sig Dispense Refill  . aspirin EC 81 MG tablet Take 1 tablet (81 mg total) by mouth daily. 60 tablet 2  . clindamycin (CLINDAGEL) 1 % gel Apply topically 2 (two) times daily. To skin lesions in perineum -- folliculitis 30 g 0  . clotrimazole (CLOTRIMAZOLE  ANTI-FUNGAL) 1 % cream Apply 1 application topically 2 (two) times daily. 30 g 0  . DULoxetine (CYMBALTA) 60 MG capsule Take 1 capsule (60 mg total) by mouth daily. 30 capsule 2  . insulin aspart (NOVOLOG) 100 UNIT/ML FlexPen As per sliding scale instructions (Patient taking differently: Inject 0-10 Units into the skin 3 (three) times daily. As per sliding scale instructions) 15 mL 2  . Insulin Degludec (TRESIBA FLEXTOUCH) 200 UNIT/ML SOPN Inject 100 Units into the skin daily before breakfast.     . ketoconazole (NIZORAL) 2 % cream Apply to rash once daily. Keep area dry between applications. 60 g 0  . levothyroxine (SYNTHROID, LEVOTHROID) 150 MCG  tablet Take 1 tablet (150 mcg total) by mouth daily before breakfast. 30 tablet 2  . levothyroxine (SYNTHROID, LEVOTHROID) 150 MCG tablet TAKE 1 TABLET BY MOUTH EVERY DAY BEFORE BREAKFAST 30 tablet 2  . lidocaine-prilocaine (EMLA) cream Apply 1 application topically as needed. 30 g 6  . loratadine (CLARITIN) 10 MG tablet Take 10 mg by mouth daily.    . methocarbamol (ROBAXIN) 500 MG tablet Take 1 tablet (500 mg total) by mouth every 8 (eight) hours as needed for muscle spasms. 8 tablet 0  . metoprolol succinate (TOPROL-XL) 50 MG 24 hr tablet Take 1 tablet (50 mg total) by mouth daily. Take with or immediately following a meal. 30 tablet 5  . miconazole (MICOTIN) 2 % powder Apply topically 2 (two) times daily. Apply to area of rash over the perineum 85 g 1  . mupirocin cream (BACTROBAN) 2 % Apply 1 application topically 2 (two) times daily. 30 g 0  . mupirocin ointment (BACTROBAN) 2 % Place 1 application into the nose 2 (two) times daily. 22 g 1  . naproxen (NAPROSYN) 500 MG tablet Take 1 tablet (500 mg total) by mouth 2 (two) times daily with a meal. 20 tablet 0  . omeprazole (PRILOSEC) 20 MG capsule Take 1 capsule (20 mg total) by mouth daily. 30 capsule 1  . oxyCODONE-acetaminophen (PERCOCET) 5-325 MG tablet Take 1 tablet by mouth every 6 (six) hours as  needed for moderate pain or severe pain. 50 tablet 0  . polyethylene glycol (MIRALAX) packet Take 17 g by mouth daily. 30 each 1  . senna-docusate (SENNA S) 8.6-50 MG tablet Take 2 tablets by mouth 2 (two) times daily. May reduce to 2 tab po HS as needed once regular BM estabished (Patient taking differently: Take 2 tablets by mouth 2 (two) times daily as needed (FOR CONSTIPATION). =) 60 tablet 1  . traZODone (DESYREL) 50 MG tablet Take 1-2 tablets (50-100 mg total) by mouth at bedtime as needed for sleep. 60 tablet 0   No current facility-administered medications for this visit.    Facility-Administered Medications Ordered in Other Visits  Medication Dose Route Frequency Provider Last Rate Last Dose  . sodium chloride 0.9 % injection 10 mL  10 mL Intravenous PRN [name], MD   10 mL at 05/24/16 1252    REVIEW OF SYSTEMS:   A 10+ POINT REVIEW OF SYSTEMS WAS OBTAINED including neurology, dermatology, psychiatry, cardiac, respiratory, lymph, extremities, GI, GU, Musculoskeletal, constitutional, breasts, reproductive, HEENT.  All pertinent positives are noted in the HPI.  All others are negative.   PHYSICAL EXAMINATION: ECOG PERFORMANCE STATUS: 1 - Symptomatic but completely ambulatory  Vitals:   11/14/17 1149  BP: 124/90  Pulse: (!) 110  Resp: 20  Temp: 97.9 F (36.6 C)  SpO2: 100%   Filed Weights   11/14/17 1149  Weight: 275 lb 4.8 oz (124.9 kg)   .Body mass index is 40.65 kg/m.  GENERAL:alert, in no acute distress and comfortable SKIN: no acute rashes, no significant lesions EYES: conjunctiva are pink and non-injected, sclera anicteric OROPHARYNX: MMM, no exudates, no oropharyngeal erythema or ulceration NECK: supple, no JVD LYMPH:  no palpable lymphadenopathy in the cervical, axillary or inguinal regions LUNGS: clear to auscultation b/l with normal respiratory effort HEART: regular rate & rhythm ABDOMEN:  normoactive bowel sounds , non tender, not distended. No  palpable hepatosplenomegaly.  Extremity: no pedal edema PSYCH: alert & oriented x 3 with fluent speech NEURO: no focal motor/sensory deficits  LABORATORY DATA: .  [name] CBC Latest Ref Rng & Units 11/14/2017 10/31/2017 10/17/2017  WBC 3.9 - 10.3 K/uL 5.6 5.4 6.4  Hemoglobin 11.6 - 15.9 g/dL 10.1(L) 8.3(L) 10.4(L)  Hematocrit 34.8 - 46.6 % 31.1(L) 26.2(L) 32.2(L)  Platelets 145 - 400 K/uL 288 277 309  HGB 9.5  . CMP Latest Ref Rng & Units 11/14/2017 10/31/2017 10/17/2017  Glucose 70 - 99 mg/dL 218(H) 163(H) 86  BUN 6 - 20 mg/dL _0 Creatinine 0.44 - 1.00 mg/dL 0.75 0.65 0.74  Sodium 135 - 145 mmol/L 137 143 144  Potassium 3.5 - 5.1 mmol/L 4.0 3.7 3.9  Chloride 98 - 111 mmol/L 101 111 105  CO2 22 - 32 mmol/L _1 Calcium 8.9 - 10.3 mg/dL 8.6(L) 7.9(L) 9.3  Total Protein 6.5 - 8.1 g/dL 7.6 6.5 7.2  Total Bilirubin 0.3 - 1.2 mg/dL 0.3 0.3 0.3  Alkaline Phos 38 - 126 U/L 99 87 93  AST 15 - 41 U/L 12(L) 14(L) 14(L)  ALT 0 - 44 U/L _2 08/18/17 Bx:   08/16/17 Molecular Pathology:    RADIOGRAPHIC STUDIES:  .Ct Chest W Contrast  Result Date: 11/11/2017 CLINICAL DATA:  Followup lung cancer. EXAM: CT CHEST, ABDOMEN, AND PELVIS WITH CONTRAST TECHNIQUE: Multidetector CT imaging of the chest, abdomen and pelvis was performed following the standard protocol during bolus administration of intravenous contrast. CONTRAST:  166m OMNIPAQUE IOHEXOL 300 MG/ML  SOLN COMPARISON:  PET-CT 08/03/2017 and CT CAP 07/28/2017. FINDINGS: CT CHEST FINDINGS Cardiovascular: Normal heart size. No pericardial effusion identified. Mild aortic atherosclerosis. Mediastinum/Nodes: Right paratracheal adenopathy measures 1.9 cm, image 18/2. Previously 1.8 cm. Right axillary adenopathy is again noted. Dominant lymph node measures 2.3 cm short axis, image 17/2. Unchanged from previous exam. Two new enlarging lymph nodes are identified within the right axilla/retropectoral region. These  measure up to 1.3 cm, image 12/2. No left-sided supraclavicular or axillary adenopathy. Lungs/Pleura: No pleural effusion. Paramediastinal fibrosis and masslike architectural distortion involving the right lung is again noted compatible with changes of external beam radiation. Underlying hypermetabolic lesion within the right lower lobe is indistinguishable from changes of external beam radiation. No new pulmonary nodules identified. Musculoskeletal: No chest wall mass or suspicious bone lesions identified. CT ABDOMEN PELVIS FINDINGS Hepatobiliary: Focal area of low attenuation within segment 4b adjacent to the falciform ligament is favored to represent focal fatty deposition. This is similar to previous examinations. Multiple stones are identified within the gallbladder which measure up to 1.8 cm, image 62/2. Fusiform dilatation of the CBD measures up to 1.4 cm. Pancreas: Unremarkable. No pancreatic ductal dilatation or surrounding inflammatory changes. Spleen: Normal in size without focal abnormality. Adrenals/Urinary Tract: Normal adrenal glands. The kidneys are unremarkable. No mass or hydronephrosis. Urinary bladder is unremarkable. Stomach/Bowel: Stomach normal. Small bowel loops have a normal course and caliber without obstruction. No pathologic dilatation of the colon. Vascular/Lymphatic: Aortic atherosclerosis. No aneurysm. No upper abdominal adenopathy. Bulky right inguinal lymph node is again noted. On today's examination this measures 6.0 x 3.9 cm, image 108/2. This has increased from 5.0 x 3.4 cm previously. Reproductive: This is again identified which appears retroflexed. No adnexal mass identified. Other: No free fluid or fluid collections identified. Musculoskeletal: No acute or significant osseous findings. IMPRESSION: 1. There has been interval increase in number of enlarged of right axillary nodes and increase in size of right inguinal adenopathy compatible with  progression of disease. 2. Stable  appearance of the right lung with changes of external beam radiation. 3.  Aortic Atherosclerosis (ICD10-I70.0). 4. Gallstones. Electronically Signed   By: Kerby Moors M.D.   On: 11/11/2017 16:30   Ct Abdomen Pelvis W Contrast  Result Date: 11/11/2017 CLINICAL DATA:  Followup lung cancer. EXAM: CT CHEST, ABDOMEN, AND PELVIS WITH CONTRAST TECHNIQUE: Multidetector CT imaging of the chest, abdomen and pelvis was performed following the standard protocol during bolus administration of intravenous contrast. CONTRAST:  125m OMNIPAQUE IOHEXOL 300 MG/ML  SOLN COMPARISON:  PET-CT 08/03/2017 and CT CAP 07/28/2017. FINDINGS: CT CHEST FINDINGS Cardiovascular: Normal heart size. No pericardial effusion identified. Mild aortic atherosclerosis. Mediastinum/Nodes: Right paratracheal adenopathy measures 1.9 cm, image 18/2. Previously 1.8 cm. Right axillary adenopathy is again noted. Dominant lymph node measures 2.3 cm short axis, image 17/2. Unchanged from previous exam. Two new enlarging lymph nodes are identified within the right axilla/retropectoral region. These measure up to 1.3 cm, image 12/2. No left-sided supraclavicular or axillary adenopathy. Lungs/Pleura: No pleural effusion. Paramediastinal fibrosis and masslike architectural distortion involving the right lung is again noted compatible with changes of external beam radiation. Underlying hypermetabolic lesion within the right lower lobe is indistinguishable from changes of external beam radiation. No new pulmonary nodules identified. Musculoskeletal: No chest wall mass or suspicious bone lesions identified. CT ABDOMEN PELVIS FINDINGS Hepatobiliary: Focal area of low attenuation within segment 4b adjacent to the falciform ligament is favored to represent focal fatty deposition. This is similar to previous examinations. Multiple stones are identified within the gallbladder which measure up to 1.8 cm, image 62/2. Fusiform dilatation of the CBD measures up to 1.4 cm.  Pancreas: Unremarkable. No pancreatic ductal dilatation or surrounding inflammatory changes. Spleen: Normal in size without focal abnormality. Adrenals/Urinary Tract: Normal adrenal glands. The kidneys are unremarkable. No mass or hydronephrosis. Urinary bladder is unremarkable. Stomach/Bowel: Stomach normal. Small bowel loops have a normal course and caliber without obstruction. No pathologic dilatation of the colon. Vascular/Lymphatic: Aortic atherosclerosis. No aneurysm. No upper abdominal adenopathy. Bulky right inguinal lymph node is again noted. On today's examination this measures 6.0 x 3.9 cm, image 108/2. This has increased from 5.0 x 3.4 cm previously. Reproductive: This is again identified which appears retroflexed. No adnexal mass identified. Other: No free fluid or fluid collections identified. Musculoskeletal: No acute or significant osseous findings. IMPRESSION: 1. There has been interval increase in number of enlarged of right axillary nodes and increase in size of right inguinal adenopathy compatible with progression of disease. 2. Stable appearance of the right lung with changes of external beam radiation. 3.  Aortic Atherosclerosis (ICD10-I70.0). 4. Gallstones. Electronically Signed   By: TKerby MoorsM.D.   On: 11/11/2017 16:30     ASSESSMENT & PLAN:   52y.o.  African-American female with history of hypertension, diabetes, asthma, dysuria mass and morbid obesity with    #1 h/o Lung Adenocarcinoma Rt sided at least Stage IIIB (on diagnosis) with large right paratracheal mass with mediastinal adenopathy that appears to have grown significantly over the last 6-7 months and rt supraclavicular LN +.  -Noted to have a small mass in the left adrenal on CT but PET/CT neg for metastatic disease. Patient has been a lifelong nonsmoker -MRI of the brain was negative for any metastatic disease. At diagnosis. -High PDL1 expression (90%) on [facility] One Neg for EGFR, ALK, ROS-1 and BRAF  mutations. -Patient completed her planned definitive chemoradiation with Carbo Taxol on 02/13/2016. No prohibitive toxicities other  than some grade 1 skin desquamation and some grade 1-2 radiation esophagitis. She has subsequently received 2 out of 2 planned dose of carboplatin + Taxol. -Patient has completed 11 doses of maintenance   -CTA chest 10/29/2016  no evidence of lung cancer progression in the chest. No PE -CT abd/pelvis-10/29/2016  Interval 5.0 x 5.0 x 4.7 cm mass in the right lateral subcutaneous fat at the level of the mid abdomen. This has CT features compatible with a metastasis or primary neoplasm. -CT C/A/P (05/12/2017): Interval development of right axillary, right external iliac and right inguinal adenopathy. Suspicious for nodal metastasis. 2. Decrease in size of right paratracheal adenopathy. 3. Decrease in size and enhancement associated with right lateral body wall lesion. 4. New small nonspecific pulmonary nodule in the left lower lobe measuring 6 mm. 5. Stable appearance of changes secondary to external beam radiation within the right lung  08/03/17 PET which revealed 2.8 cm right axillary lymph node is markedly hypermetabolic and consistent with metastatic disease. Extensive radiation changes involving the right paramediastinal lung. There is a focus of hypermetabolism in the right lower lobe which is suspicious for residual or recurrent tumor.   #2 Now with Metastatic lung adenocarcinoma with isolated metastases in the right lateral subcutaneous fat at the level of the midabdomen. she received maintenance Durvalumab from  05/24/2016 q2weeks, switched to Nivolumab q2weeks on 12/13/16 in the setting of Biopsy proven isolated metastatic disease. Given the tumor is strongly PDL1 positive and patient has only isolated metastatic disease was treated with palliative RT to metastases with significant improvement. Molecular pathology from 515/19 did not reveal obviously targetable mutations with  NF1 and TP53 mutations present.  Plan:  -Discussed pt labwork today, 11/14/17; HGB improved to 10.1, blood counts otherwise stable -Discussed the 11/11/17 CT C/A/P which revealed There has been interval increase in number of enlarged of right axillary nodes and increase in size of right inguinal adenopathy compatible with progression of disease. Stable appearance of the right lung with changes of external beam radiation.  -The pt has no prohibitive toxicities from continuing Nivolumab at this time.  -No meaningful progression of symptoms or solid organ involvement  -Discussed the option to pursue radiation therapy to her inguinal lymph nodal mass.  -pt would like a referral to rad onc for consideration of RT to address her pain, possibly referred from inguinal mass   #3 h/o radiation pneumonitis - no new symptoms. Does has some cough with exertion but this has improved.  #4 Improving fatigue (grade 1) - improved fatigue with levothyroxine compliance with near normalization of FT4 levels.  - previously given TSH of 12 -- increased Levolthyroxine from 144mg to 1567m  ---will likely need larger dose but in the setting of baseline tachycardia we are gradually increasing dose to avoid ppt cardiac arrhythmias.  -TSH today WNL. #5 normocytic anemia related to chemotherapy -resolving and stable, HGB at 10.4  #6 Left chest wall pain due to costochondritis - mx with pain medications. CTA x 3 neg for PE. No significant pain at this time.  #7 Sinus tachycardia - on BB per cards , no PE on CTA x 3 , -Still having sinus tachycardia . Partly from deconditioning and anemia. Seeing Dr. HiDebara Pickettrom cardiology  -Continues to be on Coreg  #8 neoplasm related pain is currently controlled without significant medications. Clinically likely has sleep apnea . Would need to be careful with sedative medications . 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 -trazodone 50-1015mpo HS prn   -continue Nivolumab q2weeks with labs- please schedule next 4 doses -radiation oncology referral to consider palliative RT to growing rt inguinal nodal mass -port flush with each treatment q2weeks -RTC with Dr KaIrene Limbon 4 weeks   All of the patients questions were answered with apparent satisfaction. The patient knows to call the clinic with any problems, questions or concerns.  The total time spent in the appt was 25 minutes and more than 50% was on counseling and direct patient cares.    GaSullivan LoneD MS AAHIVMS SCLos Alamitos Medical CenterTCleveland Clinic Rehabilitation Hospital, LLCematology/Oncology Physician CoTexas Health Presbyterian Hospital [facility]ano(Office):       [phone] cell):  [phone]):           [phone], ScBaldwin Jamaicaam acting as a scribe for Dr. KaIrene Limbo.I have reviewed the above documentation for accuracy and completeness, and I agree with the above. .GBrunetta GeneraD

## 2017-11-14 ENCOUNTER — Inpatient Hospital Stay: Payer: Medicaid Other | Attending: Hematology

## 2017-11-14 ENCOUNTER — Inpatient Hospital Stay: Payer: Medicaid Other

## 2017-11-14 ENCOUNTER — Encounter: Payer: Self-pay | Admitting: Hematology

## 2017-11-14 ENCOUNTER — Inpatient Hospital Stay (HOSPITAL_BASED_OUTPATIENT_CLINIC_OR_DEPARTMENT_OTHER): Payer: Medicaid Other | Admitting: Hematology

## 2017-11-14 VITALS — HR 104

## 2017-11-14 VITALS — BP 124/90 | HR 110 | Temp 97.9°F | Resp 20 | Ht 69.0 in | Wt 275.3 lb

## 2017-11-14 DIAGNOSIS — D630 Anemia in neoplastic disease: Secondary | ICD-10-CM

## 2017-11-14 DIAGNOSIS — Z9221 Personal history of antineoplastic chemotherapy: Secondary | ICD-10-CM | POA: Insufficient documentation

## 2017-11-14 DIAGNOSIS — E7800 Pure hypercholesterolemia, unspecified: Secondary | ICD-10-CM | POA: Insufficient documentation

## 2017-11-14 DIAGNOSIS — Z7982 Long term (current) use of aspirin: Secondary | ICD-10-CM

## 2017-11-14 DIAGNOSIS — R918 Other nonspecific abnormal finding of lung field: Secondary | ICD-10-CM | POA: Insufficient documentation

## 2017-11-14 DIAGNOSIS — G893 Neoplasm related pain (acute) (chronic): Secondary | ICD-10-CM | POA: Diagnosis not present

## 2017-11-14 DIAGNOSIS — I255 Ischemic cardiomyopathy: Secondary | ICD-10-CM | POA: Insufficient documentation

## 2017-11-14 DIAGNOSIS — G629 Polyneuropathy, unspecified: Secondary | ICD-10-CM | POA: Diagnosis not present

## 2017-11-14 DIAGNOSIS — Z794 Long term (current) use of insulin: Secondary | ICD-10-CM | POA: Diagnosis not present

## 2017-11-14 DIAGNOSIS — Z95828 Presence of other vascular implants and grafts: Secondary | ICD-10-CM

## 2017-11-14 DIAGNOSIS — Z923 Personal history of irradiation: Secondary | ICD-10-CM | POA: Diagnosis not present

## 2017-11-14 DIAGNOSIS — C3491 Malignant neoplasm of unspecified part of right bronchus or lung: Secondary | ICD-10-CM

## 2017-11-14 DIAGNOSIS — E039 Hypothyroidism, unspecified: Secondary | ICD-10-CM | POA: Diagnosis not present

## 2017-11-14 DIAGNOSIS — I10 Essential (primary) hypertension: Secondary | ICD-10-CM | POA: Insufficient documentation

## 2017-11-14 DIAGNOSIS — I700 Atherosclerosis of aorta: Secondary | ICD-10-CM

## 2017-11-14 DIAGNOSIS — J45909 Unspecified asthma, uncomplicated: Secondary | ICD-10-CM | POA: Insufficient documentation

## 2017-11-14 DIAGNOSIS — E669 Obesity, unspecified: Secondary | ICD-10-CM | POA: Diagnosis not present

## 2017-11-14 DIAGNOSIS — E1165 Type 2 diabetes mellitus with hyperglycemia: Secondary | ICD-10-CM | POA: Insufficient documentation

## 2017-11-14 DIAGNOSIS — R000 Tachycardia, unspecified: Secondary | ICD-10-CM

## 2017-11-14 DIAGNOSIS — G4700 Insomnia, unspecified: Secondary | ICD-10-CM | POA: Insufficient documentation

## 2017-11-14 DIAGNOSIS — Z5112 Encounter for antineoplastic immunotherapy: Secondary | ICD-10-CM | POA: Diagnosis present

## 2017-11-14 DIAGNOSIS — Z79899 Other long term (current) drug therapy: Secondary | ICD-10-CM | POA: Diagnosis not present

## 2017-11-14 DIAGNOSIS — R599 Enlarged lymph nodes, unspecified: Secondary | ICD-10-CM

## 2017-11-14 DIAGNOSIS — C792 Secondary malignant neoplasm of skin: Secondary | ICD-10-CM

## 2017-11-14 DIAGNOSIS — Z452 Encounter for adjustment and management of vascular access device: Secondary | ICD-10-CM

## 2017-11-14 LAB — CMP (CANCER CENTER ONLY)
ALK PHOS: 99 U/L (ref 38–126)
ALT: 11 U/L (ref 0–44)
ANION GAP: 10 (ref 5–15)
AST: 12 U/L — ABNORMAL LOW (ref 15–41)
Albumin: 3.2 g/dL — ABNORMAL LOW (ref 3.5–5.0)
BILIRUBIN TOTAL: 0.3 mg/dL (ref 0.3–1.2)
BUN: 9 mg/dL (ref 6–20)
CALCIUM: 8.6 mg/dL — AB (ref 8.9–10.3)
CO2: 26 mmol/L (ref 22–32)
Chloride: 101 mmol/L (ref 98–111)
Creatinine: 0.75 mg/dL (ref 0.44–1.00)
GLUCOSE: 218 mg/dL — AB (ref 70–99)
Potassium: 4 mmol/L (ref 3.5–5.1)
Sodium: 137 mmol/L (ref 135–145)
TOTAL PROTEIN: 7.6 g/dL (ref 6.5–8.1)

## 2017-11-14 LAB — CBC WITH DIFFERENTIAL/PLATELET
BASOS ABS: 0 10*3/uL (ref 0.0–0.1)
BASOS PCT: 1 %
Eosinophils Absolute: 0.3 10*3/uL (ref 0.0–0.5)
Eosinophils Relative: 5 %
HEMATOCRIT: 31.1 % — AB (ref 34.8–46.6)
Hemoglobin: 10.1 g/dL — ABNORMAL LOW (ref 11.6–15.9)
Lymphocytes Relative: 25 %
Lymphs Abs: 1.4 10*3/uL (ref 0.9–3.3)
MCH: 28.5 pg (ref 25.1–34.0)
MCHC: 32.5 g/dL (ref 31.5–36.0)
MCV: 87.6 fL (ref 79.5–101.0)
MONO ABS: 0.3 10*3/uL (ref 0.1–0.9)
Monocytes Relative: 5 %
NEUTROS ABS: 3.6 10*3/uL (ref 1.5–6.5)
Neutrophils Relative %: 64 %
Platelets: 288 10*3/uL (ref 145–400)
RBC: 3.55 MIL/uL — ABNORMAL LOW (ref 3.70–5.45)
RDW: 13.1 % (ref 11.2–14.5)
WBC: 5.6 10*3/uL (ref 3.9–10.3)

## 2017-11-14 LAB — TSH: TSH: 0.667 u[IU]/mL (ref 0.308–3.960)

## 2017-11-14 MED ORDER — HEPARIN SOD (PORK) LOCK FLUSH 100 UNIT/ML IV SOLN
500.0000 [IU] | Freq: Once | INTRAVENOUS | Status: AC | PRN
  Administered 2017-11-14: 500 [IU]
  Filled 2017-11-14: qty 5

## 2017-11-14 MED ORDER — SODIUM CHLORIDE 0.9 % IV SOLN
240.0000 mg | Freq: Once | INTRAVENOUS | Status: AC
  Administered 2017-11-14: 240 mg via INTRAVENOUS
  Filled 2017-11-14: qty 24

## 2017-11-14 MED ORDER — SODIUM CHLORIDE 0.9% FLUSH
10.0000 mL | INTRAVENOUS | Status: DC | PRN
  Administered 2017-11-14: 10 mL
  Filled 2017-11-14: qty 10

## 2017-11-14 MED ORDER — SODIUM CHLORIDE 0.9 % IV SOLN
Freq: Once | INTRAVENOUS | Status: AC
  Administered 2017-11-14: 13:00:00 via INTRAVENOUS
  Filled 2017-11-14: qty 250

## 2017-11-14 MED ORDER — SODIUM CHLORIDE 0.9 % IJ SOLN
10.0000 mL | INTRAMUSCULAR | Status: DC | PRN
  Administered 2017-11-14: 10 mL via INTRAVENOUS
  Filled 2017-11-14: qty 10

## 2017-11-14 NOTE — Progress Notes (Signed)
Okay to treat with heart rate of 104 per Dr. Irene Limbo.

## 2017-11-14 NOTE — Patient Instructions (Signed)
[facility] Discharge Instructions for Patients Receiving Chemotherapy  Today you received the following chemotherapy agents Opdivo  To help prevent nausea and vomiting after your treatment, we encourage you to take your nausea medication as directed   If you develop nausea and vomiting that is not controlled by your nausea medication, call the clinic.   BELOW ARE SYMPTOMS THAT SHOULD BE REPORTED IMMEDIATELY:  *FEVER GREATER THAN 100.5 F  *CHILLS WITH OR WITHOUT FEVER  NAUSEA AND VOMITING THAT IS NOT CONTROLLED WITH YOUR NAUSEA MEDICATION  *UNUSUAL SHORTNESS OF BREATH  *UNUSUAL BRUISING OR BLEEDING  TENDERNESS IN MOUTH AND THROAT WITH OR WITHOUT PRESENCE OF ULCERS  *URINARY PROBLEMS  *BOWEL PROBLEMS  UNUSUAL RASH Items with * indicate a potential emergency and should be followed up as soon as possible.  Feel free to call the clinic should you have any questions or concerns. The clinic phone number is [phone].  Please show the CHEMO ALERT CARD at check-in to the Emergency Department and triage nurse.   

## 2017-11-15 ENCOUNTER — Encounter: Payer: Self-pay | Admitting: Radiation Oncology

## 2017-11-21 NOTE — Progress Notes (Signed)
  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 Irene Limbo   Current treatment:  continue Nivolumab q2weeks with labs- please schedule next 4 doses -radiation oncology referral to consider palliative RT to growing rt inguinal nodal mass -port flush with each treatment q2weeks  PreviousTreatment -Concurrent Chemo-radiation with Carboplatin/Taxol. Completed RT on 02/13/2016 and has then completed 2 cycles of carboplatin + taxol. -Started Maintenance Durvalumabfrom 05/24/2016 q2weeks, switched to Nivolumab q2weeks on 12/13/16 once metastatic disease noted -s/p palliative RT to rt sided Subcutaneous metastatic mass.   Patient's main complaints related to symptomatic tumor(s) are:  diffuse pain from her right abdomen into her pelvis and stable chest pain to palpation over the last 6-7 months.  Pain on a scale of 0-10 is: No  If Spine Met(s), symptoms, if any, include:No  Bowel/Bladder retention or incontinence (please describe): Taking Miralax and senna  Numbness or weakness in extremities (please describe): Right side weakness of arm and  leg  Current Decadron regimen, if applicable: N/A  Ambulatory status? Walker? Wheelchair? Ambulatory:   SAFETY ISSUES: Prior radiation? 12-07-16 12-04-16 Abdomen (Right flank) was treated to 30 Gy in 10 fractions of 3 Gy.  01-05-16 02-13-16 Right lung was treated with 60 Gy in 30 fractions  Pacemaker/ICD? No  Possible current pregnancy? N/A  Is the patient on methotrexate? No  Additional Complaints / other details:. Divorced.Works full time for [employer] as a Scientist, water quality.  Denies family history of cancer but, unaware of paternal family history.   BP and heart rate elevated. Wt Readings from Last 3 Encounters:  11/23/17 274 lb 3.2  oz (124.4 kg)  11/14/17 275 lb 4.8 oz (124.9 kg)  10/17/17 275 lb 6.4 oz (124.9 kg)  BP 110/69 (BP Location: Right Arm, Patient Position: Sitting)   Pulse (!) 111   Temp 98.1 F (36.7 C) (Oral)   Resp 20   Ht 5' 9" (1.753 m)   Wt 274 lb 3.2 oz (124.4 kg)   LMP 01/21/2016 Comment: pt signed preg test waiver 04/16/16   SpO2 98%   BMI 40.49 kg/m

## 2017-11-23 ENCOUNTER — Encounter: Payer: Self-pay | Admitting: Urology

## 2017-11-23 ENCOUNTER — Ambulatory Visit
Admission: RE | Admit: 2017-11-23 | Discharge: 2017-11-23 | Disposition: A | Discharge status: Home / Self Care | Payer: Medicaid Other | Source: Ambulatory Visit | Attending: Radiation Oncology | Admitting: Radiation Oncology

## 2017-11-23 ENCOUNTER — Ambulatory Visit
Admission: RE | Admit: 2017-11-23 | Discharge: 2017-11-23 | Disposition: A | Discharge status: Home / Self Care | Payer: Medicaid Other | Source: Ambulatory Visit | Attending: Urology | Admitting: Urology

## 2017-11-23 ENCOUNTER — Other Ambulatory Visit: Payer: Self-pay

## 2017-11-23 VITALS — BP 110/69 | HR 111 | Temp 98.1°F | Resp 20 | Ht 69.0 in | Wt 274.2 lb

## 2017-11-23 DIAGNOSIS — I429 Cardiomyopathy, unspecified: Secondary | ICD-10-CM | POA: Diagnosis not present

## 2017-11-23 DIAGNOSIS — I10 Essential (primary) hypertension: Secondary | ICD-10-CM | POA: Diagnosis not present

## 2017-11-23 DIAGNOSIS — E039 Hypothyroidism, unspecified: Secondary | ICD-10-CM | POA: Insufficient documentation

## 2017-11-23 DIAGNOSIS — Z7982 Long term (current) use of aspirin: Secondary | ICD-10-CM | POA: Diagnosis not present

## 2017-11-23 DIAGNOSIS — Z79899 Other long term (current) drug therapy: Secondary | ICD-10-CM | POA: Diagnosis not present

## 2017-11-23 DIAGNOSIS — Z794 Long term (current) use of insulin: Secondary | ICD-10-CM | POA: Diagnosis not present

## 2017-11-23 DIAGNOSIS — C3491 Malignant neoplasm of unspecified part of right bronchus or lung: Secondary | ICD-10-CM

## 2017-11-23 DIAGNOSIS — E1140 Type 2 diabetes mellitus with diabetic neuropathy, unspecified: Secondary | ICD-10-CM | POA: Insufficient documentation

## 2017-11-23 DIAGNOSIS — Z923 Personal history of irradiation: Secondary | ICD-10-CM | POA: Diagnosis not present

## 2017-11-23 NOTE — Progress Notes (Signed)
Radiation Oncology         [phone] ________________________________   Outpatient Re-Consultation Note  Name: Marilyn [name] MRN: [id number]  Date of Service: 11/23/2017 DOB: 1965-09-26  CC:Marilyn Drafts, FNP  Marilyn Genera, MD   REFERRING PHYSICIAN: [name], MD  DIAGNOSIS: 52 y/o African-american female with Stage IV adenocarcinoma of the lung now with a painful lower abdominal/inguinal nodal metastasis    ICD-10-CM   1. Primary cancer of right lung metastatic to other site [facility]) C34.91     HISTORY OF PRESENT ILLNESS: Marilyn [name] is a 52 y.o. female seen at the request of Dr. Irene Limbo for a new metastatic right inguinal lymph nodal mass. 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  MRI of the brain was negative at the time of diagnosis.  She was treated with concurrent chemoradiation with Carboplatin/Taxol- completed RT on 02/13/2016 and then completed 2 additional cycles of carboplatin + taxol.  She started maintainence Durvalumab on 05/24/2016 given q2weeks. She was switched to Nivolumab q2weeks on 12/13/16 due to metastatic disease. She presented with a painful, enlarging metastatic right flank mass in 10/2016 which was subsequently treated with palliative radiation therapy in 12/2016.  She tolerated her treatment well and has not had further right flank pain or palpable masses.  She has continued in routine follow up with Dr. Irene Limbo since that time and has remained on Nivolumab which she tolerates well.   She had a repeat CT C/A/P in 07/2017 which showed a mixed response to treatment with increased right inferior external iliac and inguinal adenopathy consistent with metastatic disease but stability of the previously treated right abdominal wall mass and stable right lung changes  consistent with scarring from prior radiotherapy.   A PET scan was performed in 08/2017 and showed: 2.8 cm right axillary lymph node is markedly hypermetabolic and consistent with metastatic disease; extensive radiation changes involving the right paramediastinal lung, there is a focus of hypermetabolism in the right lower lobe which is suspicious for residual or recurrent tumor, there is also hypermetabolic mediastinal adenopathy; 5 cm soft tissue mass in the upper right inguinal area is hypermetabolic and consistent with metastatic adenopathy.   She underwent biopsy of the right inguinal lymph node on 08/16/17 with final pathology confirming metastatic poorly differentiated carcinoma but without any new targetable mutations. More recently, she had a repeat chest/abdomen/pelvis CT scan on 11/11/17 demonstrating interval increase in number of enlarged of right axillary nodes and an increase in size of right inguinal adenopathy compatible with progression of disease. The right lung remains stable.  She has been referred for consult today to discuss the potential role of palliative radiotherapy to the enlarging right inguinal nodal mass which is causing pain/discomfort.  PREVIOUS RADIATION THERAPY: Yes 01/05/16 - 02/13/16: The right lung was treated with 60 Gy in 30 fractions. 12/07/16 - 12/20/16: The right flank was treated to 30 Gy in 10 fractions.   PAST MEDICAL HISTORY:  Past Medical History:  Diagnosis Date  . Arthritis   . Asthma   . Diabetes mellitus    Type II  . Hemorrhoids   . Hypercholesteremia   . Hypertension   . Hypothyroidism   . Metastatic lung cancer (metastasis from lung to other site) (HCC)    Lung, Mets to skin- right flank. CT angio 2019 suspicious  for axillary, external iliac, right inguinal mets  . 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 NICM (nonischemic cardiomyopathy) ([place])    a. mild - EF 45-50% by echo 10/2016 but EF 52% by low risk nuc 11/2016. b. Echo  07/2017 - EF normal, mild AI, unremarkable echo otherwise.  . Obese   . Pneumonia 2017  . Sinus tachycardia    a. dates back to at least 2013, etiology unclear.      PAST SURGICAL HISTORY: Past Surgical History:  Procedure Laterality Date  . CESAREAN SECTION    . COLONOSCOPY    . I/D  arm Right 2013  . IR FLUORO GUIDE PORT INSERTION RIGHT  02/18/2017  . IR GENERIC HISTORICAL  01/08/2016   IR US GUIDE VASC ACCESS RIGHT 01/08/2016 [name], DO [facility]  . IR GENERIC HISTORICAL  01/08/2016   IR FLUORO GUIDE CV LINE RIGHT 01/08/2016 [name], DO [facility]  . IR US GUIDE VASC ACCESS RIGHT  02/18/2017  . MULTIPLE EXTRACTIONS WITH ALVEOLOPLASTY N/A 03/25/2017   Procedure: MULTIPLE EXTRACTION WITH ALVEOLOPLASTY (teeth #s four, six, seven, eight, nine, ten, eleven, one, twenty-three, twenty-four, twenty-five, twenty-six, twenty-nine, thirty);  Surgeon: [name], [name];  Location: [facility];  Service: Oral Surgery;  Laterality: N/A;  . TUBAL LIGATION    . US guided core needle biopsy     of right lower neck/supraclavicular lymph nodes    FAMILY HISTORY:  Family History  Problem Relation Age of Onset  . Heart failure Mother   . Hypertension Mother   . Cancer Neg Hx   . Rheumatologic disease Neg Hx     SOCIAL HISTORY:  Social History   Socioeconomic History  . Marital status: Divorced    Spouse name: Not on file  . Number of children: Not on file  . Years of education: Not on file  . Highest education level: Not on file  Occupational History  . Not on file  Social Needs  . Financial resource strain: Not on file  . Food insecurity:    Worry: Not on file    Inability: Not on file  . Transportation needs:    Medical: Not on file    Non-medical: Not on file  Tobacco Use  . Smoking status: Never Smoker  . Smokeless tobacco: Never Used  Substance and Sexual Activity  . Alcohol use: No  . Drug use: No  . Sexual activity: Not Currently  Lifestyle  . Physical  activity:    Days per week: Not on file    Minutes per session: Not on file  . Stress: Not on file  Relationships  . Social connections:    Talks on phone: Not on file    Gets together: Not on file    Attends religious service: Not on file    Active member of club or organization: Not on file    Attends meetings of clubs or organizations: Not on file    Relationship status: Not on file  . Intimate partner violence:    Fear of current or ex partner: Not on file    Emotionally abused: Not on file    Physically abused: Not on file    Forced sexual activity: Not on file  Other Topics Concern  . Not on file  Social History Narrative   No bird or mold exposure. No recent travel.   11-23-17 Unable to ask abuse questions mother with her today.    ALLERGIES:  Patient has no known allergies.  MEDICATIONS:  Current Outpatient Medications  Medication Sig Dispense Refill  . aspirin EC 81 MG tablet Take 1 tablet (81 mg total) by mouth daily. 60 tablet 2  . clotrimazole (CLOTRIMAZOLE ANTI-FUNGAL) 1 % cream Apply 1 application topically 2 (two) times daily. 30 g 0  . DULoxetine (CYMBALTA) 60 MG capsule Take 1 capsule (60 mg total) by mouth daily. 30 capsule 2  . insulin aspart (NOVOLOG) 100 UNIT/ML FlexPen As per sliding scale instructions (Patient taking differently: Inject 0-10 Units into the skin 3 (three) times daily. As per sliding scale instructions) 15 mL 2  . Insulin Degludec (TRESIBA FLEXTOUCH) 200 UNIT/ML SOPN Inject 100 Units into the skin daily before breakfast.     . levothyroxine (SYNTHROID, LEVOTHROID) 150 MCG tablet Take 1 tablet (150 mcg total) by mouth daily before breakfast. 30 tablet 2  . levothyroxine (SYNTHROID, LEVOTHROID) 150 MCG tablet TAKE 1 TABLET BY MOUTH EVERY DAY BEFORE BREAKFAST 30 tablet 2  . lidocaine-prilocaine (EMLA) cream Apply 1 application topically as needed. 30 g 6  . metoprolol succinate (TOPROL-XL) 50 MG 24 hr tablet Take 1 tablet (50 mg total) by mouth  daily. Take with or immediately following a meal. 30 tablet 5  . naproxen (NAPROSYN) 500 MG tablet Take 1 tablet (500 mg total) by mouth 2 (two) times daily with a meal. 20 tablet 0  . oxyCODONE-acetaminophen (PERCOCET) 5-325 MG tablet Take 1 tablet by mouth every 6 (six) hours as needed for moderate pain or severe pain. 50 tablet 0  . polyethylene glycol (MIRALAX) packet Take 17 g by mouth daily. 30 each 1  . senna-docusate (SENNA S) 8.6-50 MG tablet Take 2 tablets by mouth 2 (two) times daily. May reduce to 2 tab po HS as needed once regular BM estabished (Patient taking differently: Take 2 tablets by mouth 2 (two) times daily as needed (FOR CONSTIPATION). =) 60 tablet 1  . clindamycin (CLINDAGEL) 1 % gel Apply topically 2 (two) times daily. To skin lesions in perineum -- folliculitis (Patient not taking: Reported on 11/23/2017) 30 g 0  . ketoconazole (NIZORAL) 2 % cream Apply to rash once daily. Keep area dry between applications. (Patient not taking: Reported on 11/23/2017) 60 g 0  . loratadine (CLARITIN) 10 MG tablet Take 10 mg by mouth daily.    . methocarbamol (ROBAXIN) 500 MG tablet Take 1 tablet (500 mg total) by mouth every 8 (eight) hours as needed for muscle spasms. (Patient not taking: Reported on 11/23/2017) 8 tablet 0  . miconazole (MICOTIN) 2 % powder Apply topically 2 (two) times daily. Apply to area of rash over the perineum (Patient not taking: Reported on 11/23/2017) 85 g 1  . omeprazole (PRILOSEC) 20 MG capsule Take 1 capsule (20 mg total) by mouth daily. (Patient not taking: Reported on 11/23/2017) 30 capsule 1  . traZODone (DESYREL) 50 MG tablet Take 1-2 tablets (50-100 mg total) by mouth at bedtime as needed for sleep. (Patient not taking: Reported on 11/23/2017) 60 tablet 0   No current facility-administered medications for this encounter.    Facility-Administered Medications Ordered in Other Encounters  Medication Dose Route Frequency Provider Last Rate Last Dose  . sodium  chloride 0.9 % injection 10 mL  10 mL Intravenous PRN Marilyn Genera, MD   10 mL at 05/24/16 1252    REVIEW OF SYSTEMS:  On review of systems, the patient reports that she is doing well overall.  She reports pain to her right  lower abdomen extending into her pelvis, and occasionally radiates to her back, for the last 6-7 months, progressively worsening. She notes that it feels like a bruise and is tender to palpation but is also painful without palpation/pressure. She reports seeing blood in her stool intermittently with hard bowel movements, noting she does not have colonoscopies regularly.  She has seen blood on the toilet tissue and in the commode with BMs but not with every BM. She denies any chest pain, shortness of breath, cough, fevers, chills, night sweats, unintended weight changes. She denies any bowel or bladder disturbances, nausea or vomiting. She denies any new musculoskeletal or joint aches or pains. A complete review of systems is obtained and is otherwise negative.    PHYSICAL EXAM:  Wt Readings from Last 3 Encounters:  11/23/17 274 lb 3.2 oz (124.4 kg)  11/14/17 275 lb 4.8 oz (124.9 kg)  10/17/17 275 lb 6.4 oz (124.9 kg)   Temp Readings from Last 3 Encounters:  11/23/17 98.1 F (36.7 C) (Oral)  11/14/17 97.9 F (36.6 C) (Oral)  10/31/17 98.4 F (36.9 C) (Oral)   BP Readings from Last 3 Encounters:  11/23/17 110/69  11/14/17 124/90  10/31/17 129/87   Pulse Readings from Last 3 Encounters:  11/23/17 (!) 111  11/14/17 (!) 104  11/14/17 (!) 110   Pain Assessment Pain Score: 0-No pain/10  In general this is a well appearing African-american female in no acute distress. She is alert and oriented x4 and appropriate throughout the examination. HEENT reveals that the patient is normocephalic, atraumatic. EOMs are intact. PERRLA. Skin is intact without any evidence of gross lesions. Cardiovascular exam reveals a regular rate and rhythm, no clicks rubs or murmurs are  auscultated. Chest is clear to auscultation bilaterally. Lymphatic assessment is performed and does not reveal any adenopathy in the cervical, supraclavicular, axillary, or inguinal chains. Abdomen has active bowel sounds in all quadrants and is intact. The abdomen is soft, non tender, non distended. There are no palpable masses but she is tender to palpate over the right lower quadrant abdomen.  Lower extremities are negative for pretibial pitting edema, deep calf tenderness, cyanosis or clubbing.  KPS = 90  100 - Normal; no complaints; no evidence of disease. 90   - Able to carry on normal activity; minor signs or symptoms of disease. 80   - Normal activity with effort; some signs or symptoms of disease. 37   - Cares for self; unable to carry on normal activity or to do active work. 60   - Requires occasional assistance, but is able to care for most of his personal needs. 50   - Requires considerable assistance and frequent medical care. 58   - Disabled; requires special care and assistance. 109   - Severely disabled; hospital admission is indicated although death not imminent. 45   - Very sick; hospital admission necessary; active supportive treatment necessary. 10   - Moribund; fatal processes progressing rapidly. 0     - Dead  Karnofsky DA, Abelmann [place], Craver LS and Burchenal [facility] [phone]) The use of the nitrogen mustards in the palliative treatment of carcinoma: with particular reference to bronchogenic carcinoma Cancer 1 634-56  LABORATORY DATA:  Lab Results  Component Value Date   WBC 5.6 11/14/2017   HGB 10.1 (L) 11/14/2017   HCT 31.1 (L) 11/14/2017   MCV 87.6 11/14/2017   PLT 288 11/14/2017   Lab Results  Component Value Date   NA 137 11/14/2017   K  4.0 11/14/2017   CL 101 11/14/2017   CO2 26 11/14/2017   Lab Results  Component Value Date   ALT 11 11/14/2017   AST 12 (L) 11/14/2017   ALKPHOS 99 11/14/2017   BILITOT 0.3 11/14/2017     RADIOGRAPHY: Ct Chest W  Contrast  Result Date: 11/11/2017 CLINICAL DATA:  Followup lung cancer. EXAM: CT CHEST, ABDOMEN, AND PELVIS WITH CONTRAST TECHNIQUE: Multidetector CT imaging of the chest, abdomen and pelvis was performed following the standard protocol during bolus administration of intravenous contrast. CONTRAST:  186mL OMNIPAQUE IOHEXOL 300 MG/ML  SOLN COMPARISON:  PET-CT 08/03/2017 and CT CAP 07/28/2017. FINDINGS: CT CHEST FINDINGS Cardiovascular: Normal heart size. No pericardial effusion identified. Mild aortic atherosclerosis. Mediastinum/Nodes: Right paratracheal adenopathy measures 1.9 cm, image 18/2. Previously 1.8 cm. Right axillary adenopathy is again noted. Dominant lymph node measures 2.3 cm short axis, image 17/2. Unchanged from previous exam. Two new enlarging lymph nodes are identified within the right axilla/retropectoral region. These measure up to 1.3 cm, image 12/2. No left-sided supraclavicular or axillary adenopathy. Lungs/Pleura: No pleural effusion. Paramediastinal fibrosis and masslike architectural distortion involving the right lung is again noted compatible with changes of external beam radiation. Underlying hypermetabolic lesion within the right lower lobe is indistinguishable from changes of external beam radiation. No new pulmonary nodules identified. Musculoskeletal: No chest wall mass or suspicious bone lesions identified. CT ABDOMEN PELVIS FINDINGS Hepatobiliary: Focal area of low attenuation within segment 4b adjacent to the falciform ligament is favored to represent focal fatty deposition. This is similar to previous examinations. Multiple stones are identified within the gallbladder which measure up to 1.8 cm, image 62/2. Fusiform dilatation of the CBD measures up to 1.4 cm. Pancreas: Unremarkable. No pancreatic ductal dilatation or surrounding inflammatory changes. Spleen: Normal in size without focal abnormality. Adrenals/Urinary Tract: Normal adrenal glands. The kidneys are unremarkable. No  mass or hydronephrosis. Urinary bladder is unremarkable. Stomach/Bowel: Stomach normal. Small bowel loops have a normal course and caliber without obstruction. No pathologic dilatation of the colon. Vascular/Lymphatic: Aortic atherosclerosis. No aneurysm. No upper abdominal adenopathy. Bulky right inguinal lymph node is again noted. On today's examination this measures 6.0 x 3.9 cm, image 108/2. This has increased from 5.0 x 3.4 cm previously. Reproductive: This is again identified which appears retroflexed. No adnexal mass identified. Other: No free fluid or fluid collections identified. Musculoskeletal: No acute or significant osseous findings. IMPRESSION: 1. There has been interval increase in number of enlarged of right axillary nodes and increase in size of right inguinal adenopathy compatible with progression of disease. 2. Stable appearance of the right lung with changes of external beam radiation. 3.  Aortic Atherosclerosis (ICD10-I70.0). 4. Gallstones. Electronically Signed   By: Kerby Moors M.D.   On: 11/11/2017 16:30   Ct Abdomen Pelvis W Contrast  Result Date: 11/11/2017 CLINICAL DATA:  Followup lung cancer. EXAM: CT CHEST, ABDOMEN, AND PELVIS WITH CONTRAST TECHNIQUE: Multidetector CT imaging of the chest, abdomen and pelvis was performed following the standard protocol during bolus administration of intravenous contrast. CONTRAST:  157mL OMNIPAQUE IOHEXOL 300 MG/ML  SOLN COMPARISON:  PET-CT 08/03/2017 and CT CAP 07/28/2017. FINDINGS: CT CHEST FINDINGS Cardiovascular: Normal heart size. No pericardial effusion identified. Mild aortic atherosclerosis. Mediastinum/Nodes: Right paratracheal adenopathy measures 1.9 cm, image 18/2. Previously 1.8 cm. Right axillary adenopathy is again noted. Dominant lymph node measures 2.3 cm short axis, image 17/2. Unchanged from previous exam. Two new enlarging lymph nodes are identified within the right axilla/retropectoral region. These measure up to  1.3 cm, image  12/2. No left-sided supraclavicular or axillary adenopathy. Lungs/Pleura: No pleural effusion. Paramediastinal fibrosis and masslike architectural distortion involving the right lung is again noted compatible with changes of external beam radiation. Underlying hypermetabolic lesion within the right lower lobe is indistinguishable from changes of external beam radiation. No new pulmonary nodules identified. Musculoskeletal: No chest wall mass or suspicious bone lesions identified. CT ABDOMEN PELVIS FINDINGS Hepatobiliary: Focal area of low attenuation within segment 4b adjacent to the falciform ligament is favored to represent focal fatty deposition. This is similar to previous examinations. Multiple stones are identified within the gallbladder which measure up to 1.8 cm, image 62/2. Fusiform dilatation of the CBD measures up to 1.4 cm. Pancreas: Unremarkable. No pancreatic ductal dilatation or surrounding inflammatory changes. Spleen: Normal in size without focal abnormality. Adrenals/Urinary Tract: Normal adrenal glands. The kidneys are unremarkable. No mass or hydronephrosis. Urinary bladder is unremarkable. Stomach/Bowel: Stomach normal. Small bowel loops have a normal course and caliber without obstruction. No pathologic dilatation of the colon. Vascular/Lymphatic: Aortic atherosclerosis. No aneurysm. No upper abdominal adenopathy. Bulky right inguinal lymph node is again noted. On today's examination this measures 6.0 x 3.9 cm, image 108/2. This has increased from 5.0 x 3.4 cm previously. Reproductive: This is again identified which appears retroflexed. No adnexal mass identified. Other: No free fluid or fluid collections identified. Musculoskeletal: No acute or significant osseous findings. IMPRESSION: 1. There has been interval increase in number of enlarged of right axillary nodes and increase in size of right inguinal adenopathy compatible with progression of disease. 2. Stable appearance of the right lung  with changes of external beam radiation. 3.  Aortic Atherosclerosis (ICD10-I70.0). 4. Gallstones. Electronically Signed   By: Kerby Moors M.D.   On: 11/11/2017 16:30      IMPRESSION/PLAN: 1. 52 y.o. African-american female with Stage IV adenocarcinoma of the lung now with a painful lower abdominal/inguinal nodal metastasis  Today, we talked to the patient and her mother about the findings and work-up thus far.  We discussed the natural history of metastatic lung cancer and general treatment, highlighting the role of palliative radiotherapy in the management.  We discussed the available radiation techniques, and focused on the details of logistics and delivery.  The recommendation is for a total of 10 daily radiation treatments to the right lower abdominal/inguinal nodal mass delivered over the course of 2 weeks.  We reviewed the anticipated acute and late sequelae associated with radiation in this setting.  The patient was encouraged to ask questions that we answered to the best of our ability.      At the end of our discussion, the patient elects to proceed with palliative radiation and will be scheduled for CT simulation on Friday, 11/25/17 at 9am.  We anticipate beginning treatments early next week.  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  She has freely signed a written consent today in the office and a copy of this document was placed in her medical chart.  The patient was provided a copy as well.  She knows to call with any questions or concerns in the interim.  We spent 60 minutes minutes face to face with the patient and more than 50% of that time was spent in counseling and/or coordination of care.   Nicholos Johns, PA-C    Tyler Pita, MD  [facility] Direct Dial: [phone]  Fax: [phone] [email or web address]  Skype  [email or web address]  This document serves as a record of services  personally performed by Tyler Pita, MD and Freeman Caldron, PA-C. It was created on their behalf by Wilburn Mylar, a trained medical scribe. The creation of this record is based on the scribe's personal observations and the provider's statements to them. This document has been checked and approved by the attending provider.

## 2017-11-25 ENCOUNTER — Ambulatory Visit
Admission: RE | Admit: 2017-11-25 | Discharge: 2017-11-25 | Disposition: A | Discharge status: Home / Self Care | Payer: Medicaid Other | Source: Ambulatory Visit | Attending: Radiation Oncology | Admitting: Radiation Oncology

## 2017-11-25 DIAGNOSIS — C774 Secondary and unspecified malignant neoplasm of inguinal and lower limb lymph nodes: Secondary | ICD-10-CM | POA: Insufficient documentation

## 2017-11-25 DIAGNOSIS — Z510 Encounter for antineoplastic radiation therapy: Secondary | ICD-10-CM | POA: Insufficient documentation

## 2017-11-25 DIAGNOSIS — C3491 Malignant neoplasm of unspecified part of right bronchus or lung: Secondary | ICD-10-CM | POA: Insufficient documentation

## 2017-11-25 NOTE — Progress Notes (Signed)
  Radiation Oncology         [phone] ________________________________  Name: Marilyn [name] MRN: [id number]  Date: 11/25/2017  DOB: 10-Jul-1965  SIMULATION AND TREATMENT PLANNING NOTE    ICD-10-CM   1. Primary cancer of right lung metastatic to other site [facility]) C34.91     DIAGNOSIS:  52 yo woman with Stage IV adenocarcinoma of the lung now with a painful lower abdominal/inguinal nodal metastasis  NARRATIVE:  The patient was brought to the [place].  Identity was confirmed.  All relevant records and images related to the planned course of therapy were reviewed.  The patient freely provided informed written consent to proceed with treatment after reviewing the details related to the planned course of therapy. The consent form was witnessed and verified by the simulation staff.  Then, the patient was set-up in a stable reproducible  supine position for radiation therapy.  CT images were obtained.  Surface markings were placed.  The CT images were loaded into the planning software.  Then the target and avoidance structures were contoured.  Treatment planning then occurred.  The radiation prescription was entered and confirmed.  Then, I designed and supervised the construction of medically necessary complex treatment devices.  I have requested : 3D Simulation  I have requested a DVH of the following structures: bladder, rectum, kidneys and target.   PLAN:  The patient will receive 30 Gy in 10 fraction.  ________________________________  Sheral Apley Tammi Klippel, M.D.  This document serves as a record of services personally performed by Tyler Pita, MD. It was created on his behalf by Wilburn Mylar, a trained medical scribe. The creation of this record is based on the scribe's personal observations and the provider's statements to them. This document has been checked and approved by the attending provider.

## 2017-11-28 ENCOUNTER — Inpatient Hospital Stay: Payer: Medicaid Other

## 2017-11-28 ENCOUNTER — Other Ambulatory Visit: Payer: Self-pay | Admitting: Hematology

## 2017-11-28 ENCOUNTER — Ambulatory Visit: Payer: Medicaid Other

## 2017-11-28 VITALS — BP 136/83 | HR 103 | Temp 98.2°F | Resp 20

## 2017-11-28 DIAGNOSIS — C792 Secondary malignant neoplasm of skin: Secondary | ICD-10-CM

## 2017-11-28 DIAGNOSIS — C3491 Malignant neoplasm of unspecified part of right bronchus or lung: Secondary | ICD-10-CM

## 2017-11-28 DIAGNOSIS — Z5112 Encounter for antineoplastic immunotherapy: Secondary | ICD-10-CM | POA: Diagnosis not present

## 2017-11-28 DIAGNOSIS — Z510 Encounter for antineoplastic radiation therapy: Secondary | ICD-10-CM | POA: Diagnosis not present

## 2017-11-28 LAB — CMP (CANCER CENTER ONLY)
ALK PHOS: 95 U/L (ref 38–126)
ALT: 10 U/L (ref 0–44)
AST: 11 U/L — ABNORMAL LOW (ref 15–41)
Albumin: 3 g/dL — ABNORMAL LOW (ref 3.5–5.0)
Anion gap: 6 (ref 5–15)
BUN: 8 mg/dL (ref 6–20)
CHLORIDE: 102 mmol/L (ref 98–111)
CO2: 28 mmol/L (ref 22–32)
CREATININE: 0.85 mg/dL (ref 0.44–1.00)
Calcium: 8.6 mg/dL — ABNORMAL LOW (ref 8.9–10.3)
GFR, Est AFR Am: >60 mL/min (ref >60)
Glucose, Bld: 346 mg/dL — ABNORMAL HIGH (ref 70–99)
Potassium: 3.9 mmol/L (ref 3.5–5.1)
Sodium: 136 mmol/L (ref 135–145)
Total Bilirubin: <0.2 mg/dL — ABNORMAL LOW (ref 0.3–1.2)
Total Protein: 7.3 g/dL (ref 6.5–8.1)

## 2017-11-28 LAB — CBC WITH DIFFERENTIAL/PLATELET
BASOS ABS: 0 10*3/uL (ref 0.0–0.1)
Basophils Relative: 1 %
EOS PCT: 6 %
Eosinophils Absolute: 0.3 10*3/uL (ref 0.0–0.5)
HCT: 29.9 % — ABNORMAL LOW (ref 34.8–46.6)
Hemoglobin: 9.9 g/dL — ABNORMAL LOW (ref 11.6–15.9)
LYMPHS PCT: 30 %
Lymphs Abs: 1.4 10*3/uL (ref 0.9–3.3)
MCH: 28.7 pg (ref 25.1–34.0)
MCHC: 33.1 g/dL (ref 31.5–36.0)
MCV: 86.6 fL (ref 79.5–101.0)
Monocytes Absolute: 0.3 10*3/uL (ref 0.1–0.9)
Monocytes Relative: 7 %
NEUTROS ABS: 2.7 10*3/uL (ref 1.5–6.5)
NEUTROS PCT: 56 %
PLATELETS: 267 10*3/uL (ref 145–400)
RBC: 3.45 MIL/uL — ABNORMAL LOW (ref 3.70–5.45)
RDW: 13.8 % (ref 11.2–14.5)
WBC: 4.8 10*3/uL (ref 3.9–10.3)

## 2017-11-28 MED ORDER — SODIUM CHLORIDE 0.9 % IV SOLN
Freq: Once | INTRAVENOUS | Status: AC
  Administered 2017-11-28: 14:00:00 via INTRAVENOUS
  Filled 2017-11-28: qty 250

## 2017-11-28 MED ORDER — SODIUM CHLORIDE 0.9% FLUSH
10.0000 mL | INTRAVENOUS | Status: DC | PRN
  Administered 2017-11-28: 10 mL
  Filled 2017-11-28: qty 10

## 2017-11-28 MED ORDER — SODIUM CHLORIDE 0.9 % IV SOLN
240.0000 mg | Freq: Once | INTRAVENOUS | Status: AC
  Administered 2017-11-28: 240 mg via INTRAVENOUS
  Filled 2017-11-28: qty 24

## 2017-11-28 MED ORDER — HEPARIN SOD (PORK) LOCK FLUSH 100 UNIT/ML IV SOLN
500.0000 [IU] | Freq: Once | INTRAVENOUS | Status: AC | PRN
  Administered 2017-11-28: 500 [IU]
  Filled 2017-11-28: qty 5

## 2017-11-28 NOTE — Patient Instructions (Signed)
[facility] Discharge Instructions for Patients Receiving Chemotherapy  Today you received the following chemotherapy agents Opdivo  To help prevent nausea and vomiting after your treatment, we encourage you to take your nausea medication as directed   If you develop nausea and vomiting that is not controlled by your nausea medication, call the clinic.   BELOW ARE SYMPTOMS THAT SHOULD BE REPORTED IMMEDIATELY:  *FEVER GREATER THAN 100.5 F  *CHILLS WITH OR WITHOUT FEVER  NAUSEA AND VOMITING THAT IS NOT CONTROLLED WITH YOUR NAUSEA MEDICATION  *UNUSUAL SHORTNESS OF BREATH  *UNUSUAL BRUISING OR BLEEDING  TENDERNESS IN MOUTH AND THROAT WITH OR WITHOUT PRESENCE OF ULCERS  *URINARY PROBLEMS  *BOWEL PROBLEMS  UNUSUAL RASH Items with * indicate a potential emergency and should be followed up as soon as possible.  Feel free to call the clinic should you have any questions or concerns. The clinic phone number is [phone].  Please show the CHEMO ALERT CARD at check-in to the Emergency Department and triage nurse.   

## 2017-11-28 NOTE — Progress Notes (Signed)
Per Dr Kale, ok to treat with today's labs. 

## 2017-11-30 ENCOUNTER — Ambulatory Visit
Admission: RE | Admit: 2017-11-30 | Discharge: 2017-11-30 | Disposition: A | Discharge status: Home / Self Care | Payer: Medicaid Other | Source: Ambulatory Visit | Attending: Radiation Oncology | Admitting: Radiation Oncology

## 2017-11-30 DIAGNOSIS — Z510 Encounter for antineoplastic radiation therapy: Secondary | ICD-10-CM | POA: Diagnosis not present

## 2017-11-30 IMAGING — CT CT ANGIO CHEST
2 of 6 series · 18 of 36 positions shown · IV contrast (isovue)
Comparison: Chest radiograph earlier this day. Multiple prior chest
CT, most recently 10/29/2016

CLINICAL DATA: Shortness of breath. Active treatment for lung
cancer.

EXAM:
CT ANGIOGRAPHY CHEST WITH CONTRAST
TECHNIQUE: Multidetector CT imaging of the chest was performed using the
standard protocol during bolus administration of intravenous
contrast. Multiplanar CT image reconstructions and MIPs were
obtained to evaluate the vascular anatomy.
CONTRAST:  100 cc Isovue 370 IV

[Series 6: thins for pacs · axial · 0.74mm/px · z∈[-358,-121]mm · 17 of 265 slices shown]
[im 14/265  lung]
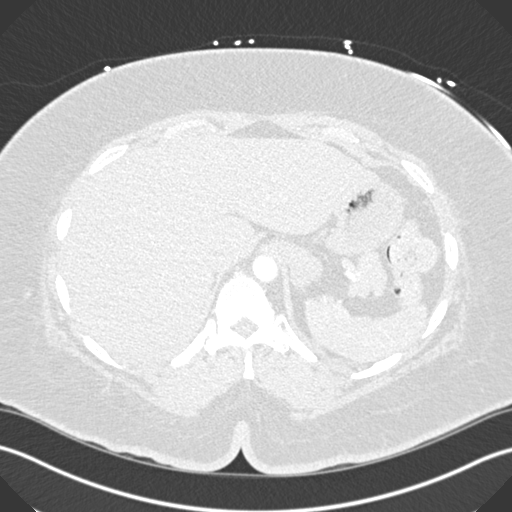
[im 27/265  mediastinal]
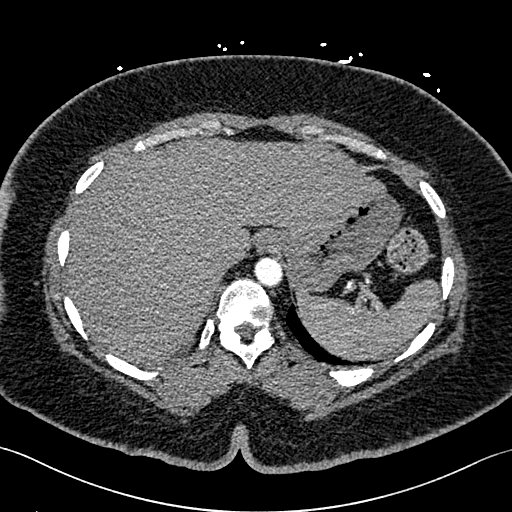
[im 40/265  lung]
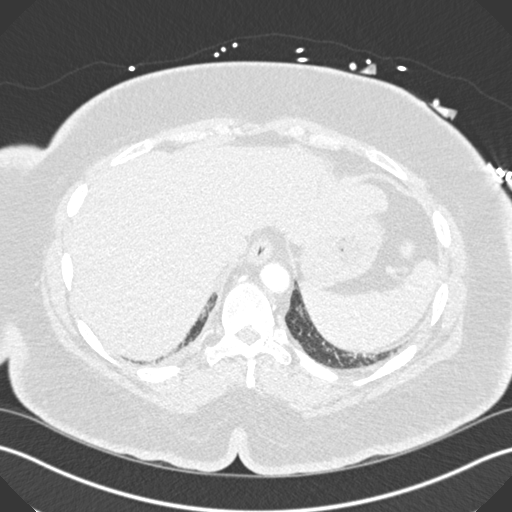
[im 53/265  mediastinal]
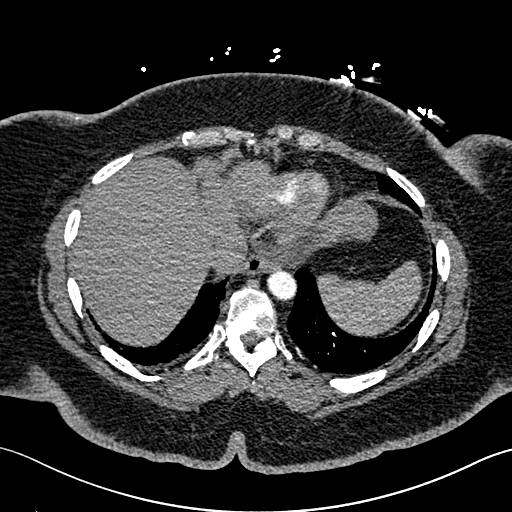
[im 80/265  lung]
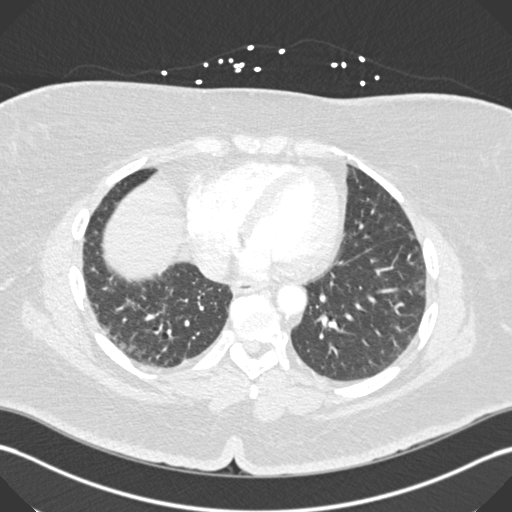
[im 93/265  mediastinal]
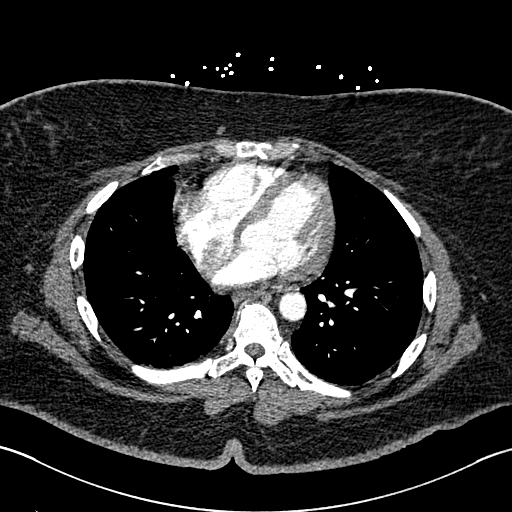
[im 106/265  lung]
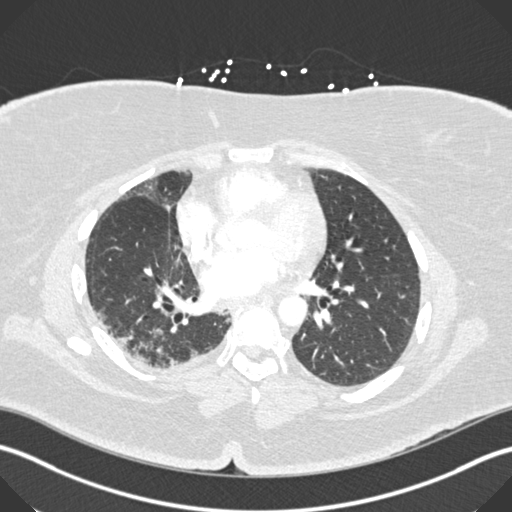
[im 119/265  mediastinal]
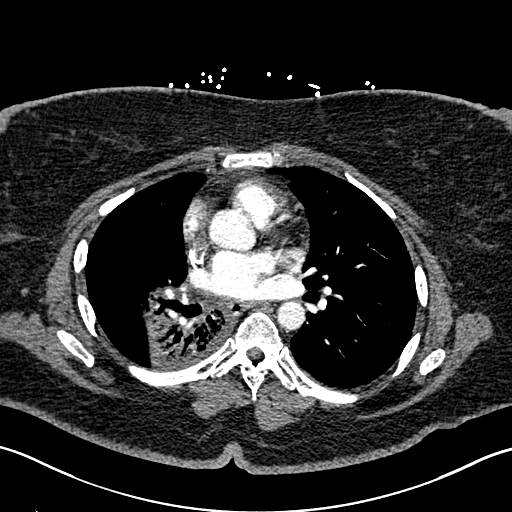
[im 133/265  lung]
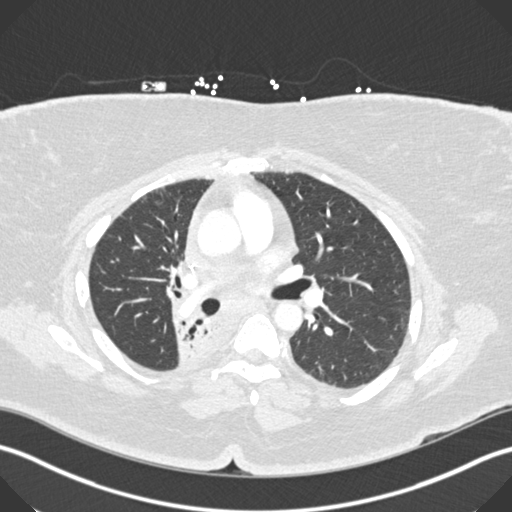
[im 146/265  mediastinal]
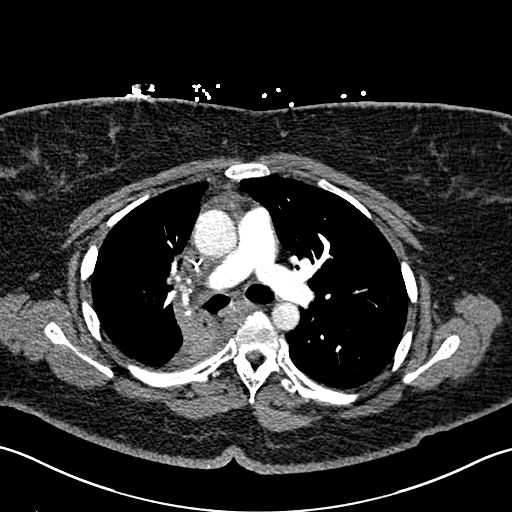
[im 159/265  lung]
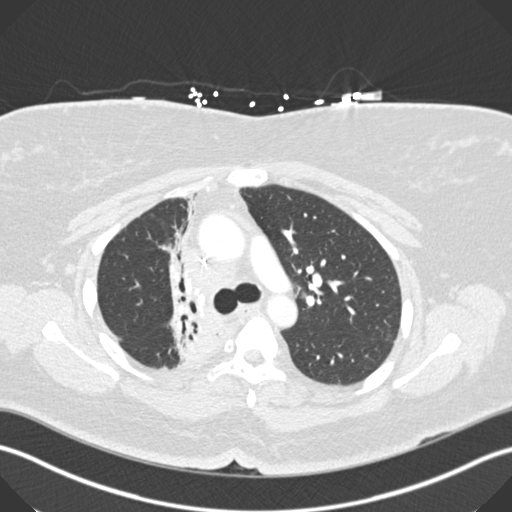
[im 172/265  mediastinal]
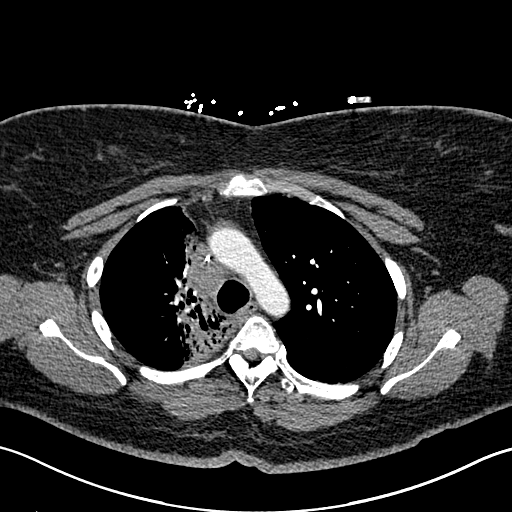
[im 185/265  lung]
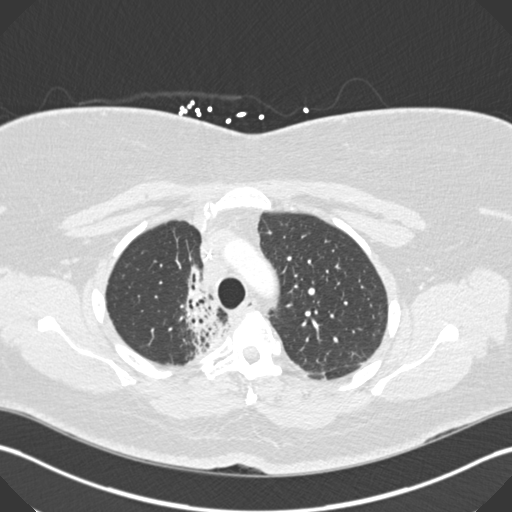
[im 212/265  mediastinal]
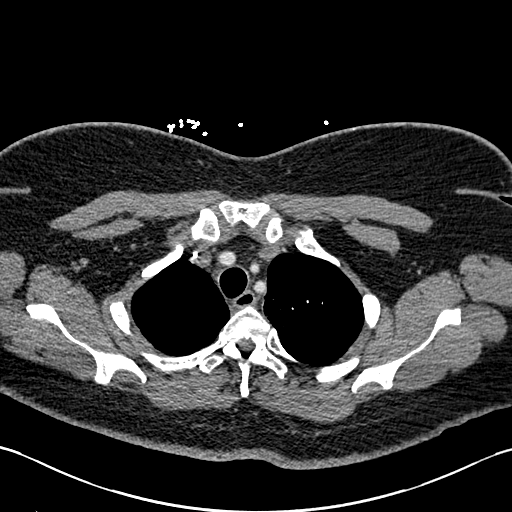
[im 225/265  lung]
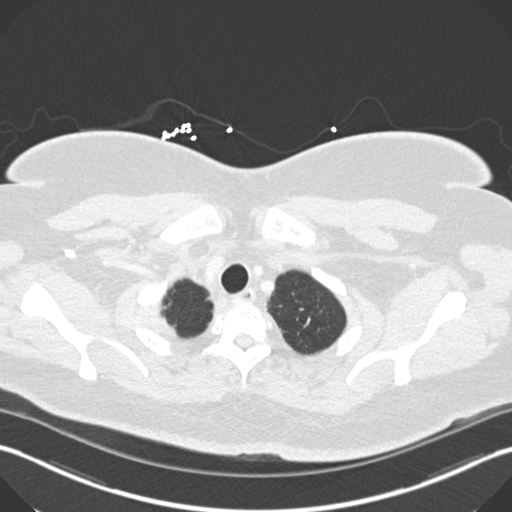
[im 238/265  mediastinal]
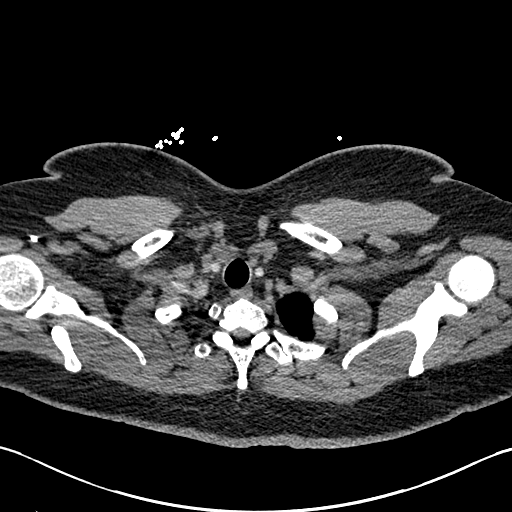
[im 251/265  lung]
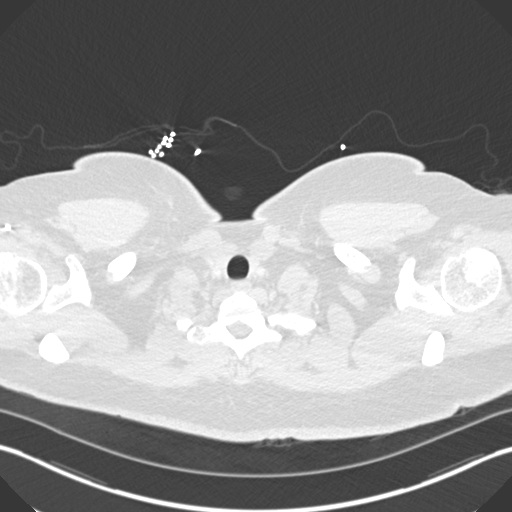

[Series 8: coronal mpr · coronal · 0.53mm/px · 1 of 107 slices shown]
[im 54/107  mediastinal]
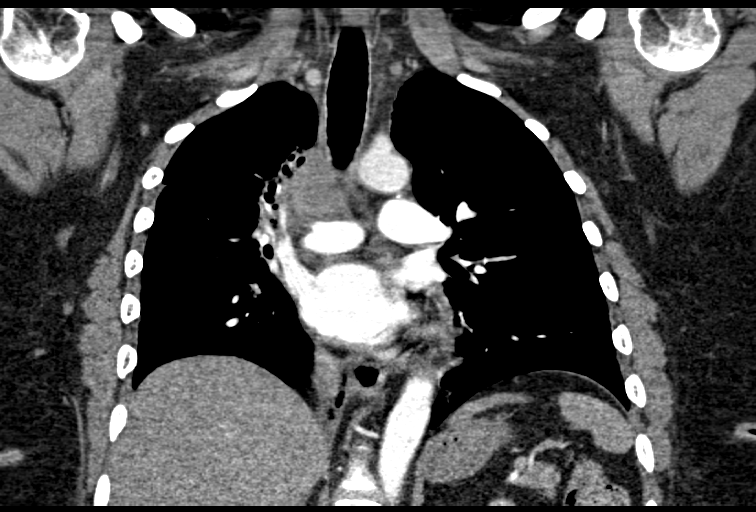

[18 of 36 positions shown; findings below may reference images not displayed]

FINDINGS: Cardiovascular: There are no filling defects within the pulmonary
arteries to suggest pulmonary embolus. Attenuated right pulmonary
arteries consistent with treatment change. Aortic atherosclerosis
without aneurysm or dissection. Heart is normal in size. Similar
pericardial effusion measuring up to 8 mm. Tip of the right central
line in the distal SVC.

Mediastinum/Nodes: Precarinal soft tissue density measuring 3 mm is
unchanged allowing for differences in caliper placement. 10 mm
subcarinal node is similar, difficult to differentiate from adjacent
opacity. No new mediastinal or hilar adenopathy. The esophagus is
decompressed. Visualized thyroid gland is normal.

Lungs/Pleura: Stable predominantly paramediastinal postradiation
change in the right lung. The left lung is clear. No new pulmonary
nodule. Minimal mucous in the right mainstem bronchus no pleural
fluid.

Upper Abdomen: No acute abnormality.

Musculoskeletal: No lytic or blastic lesions. Stable degenerative
change in the thoracic spine.

Review of the MIP images confirms the above findings.
IMPRESSION: 1. No pulmonary embolus.
2. No acute abnormality. Stable posttreatment related changes in the
right lung. Stable precarinal mediastinal mass/node.

## 2017-11-30 IMAGING — CR DG CHEST 2V
2 series · 2 of 2 positions shown · non-contrast
Comparison: Radiographs and CTA 10/29/2016

CLINICAL DATA: Chest pain and shortness of breath.

EXAM:
CHEST  2 VIEW

[w chest lat]
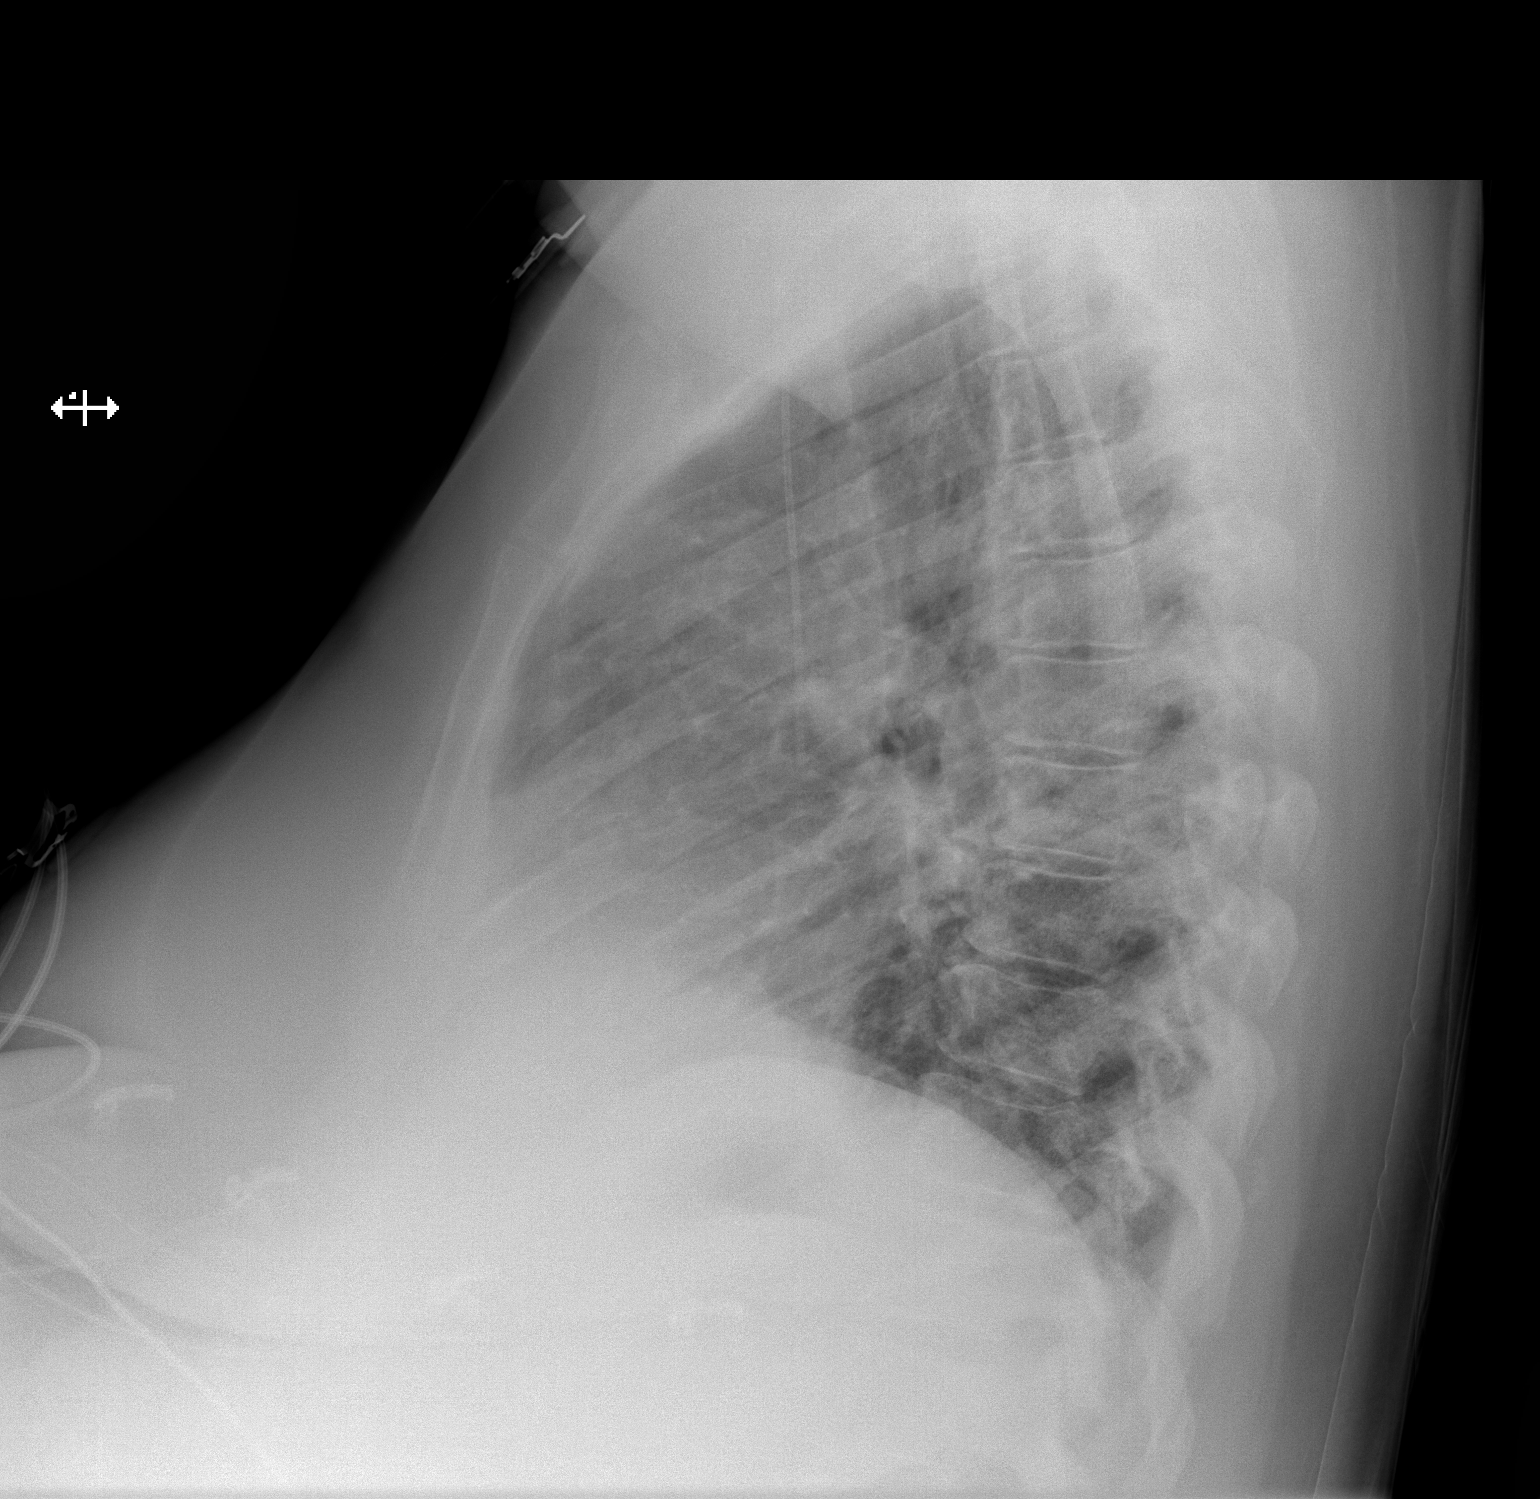

[x chest ap]
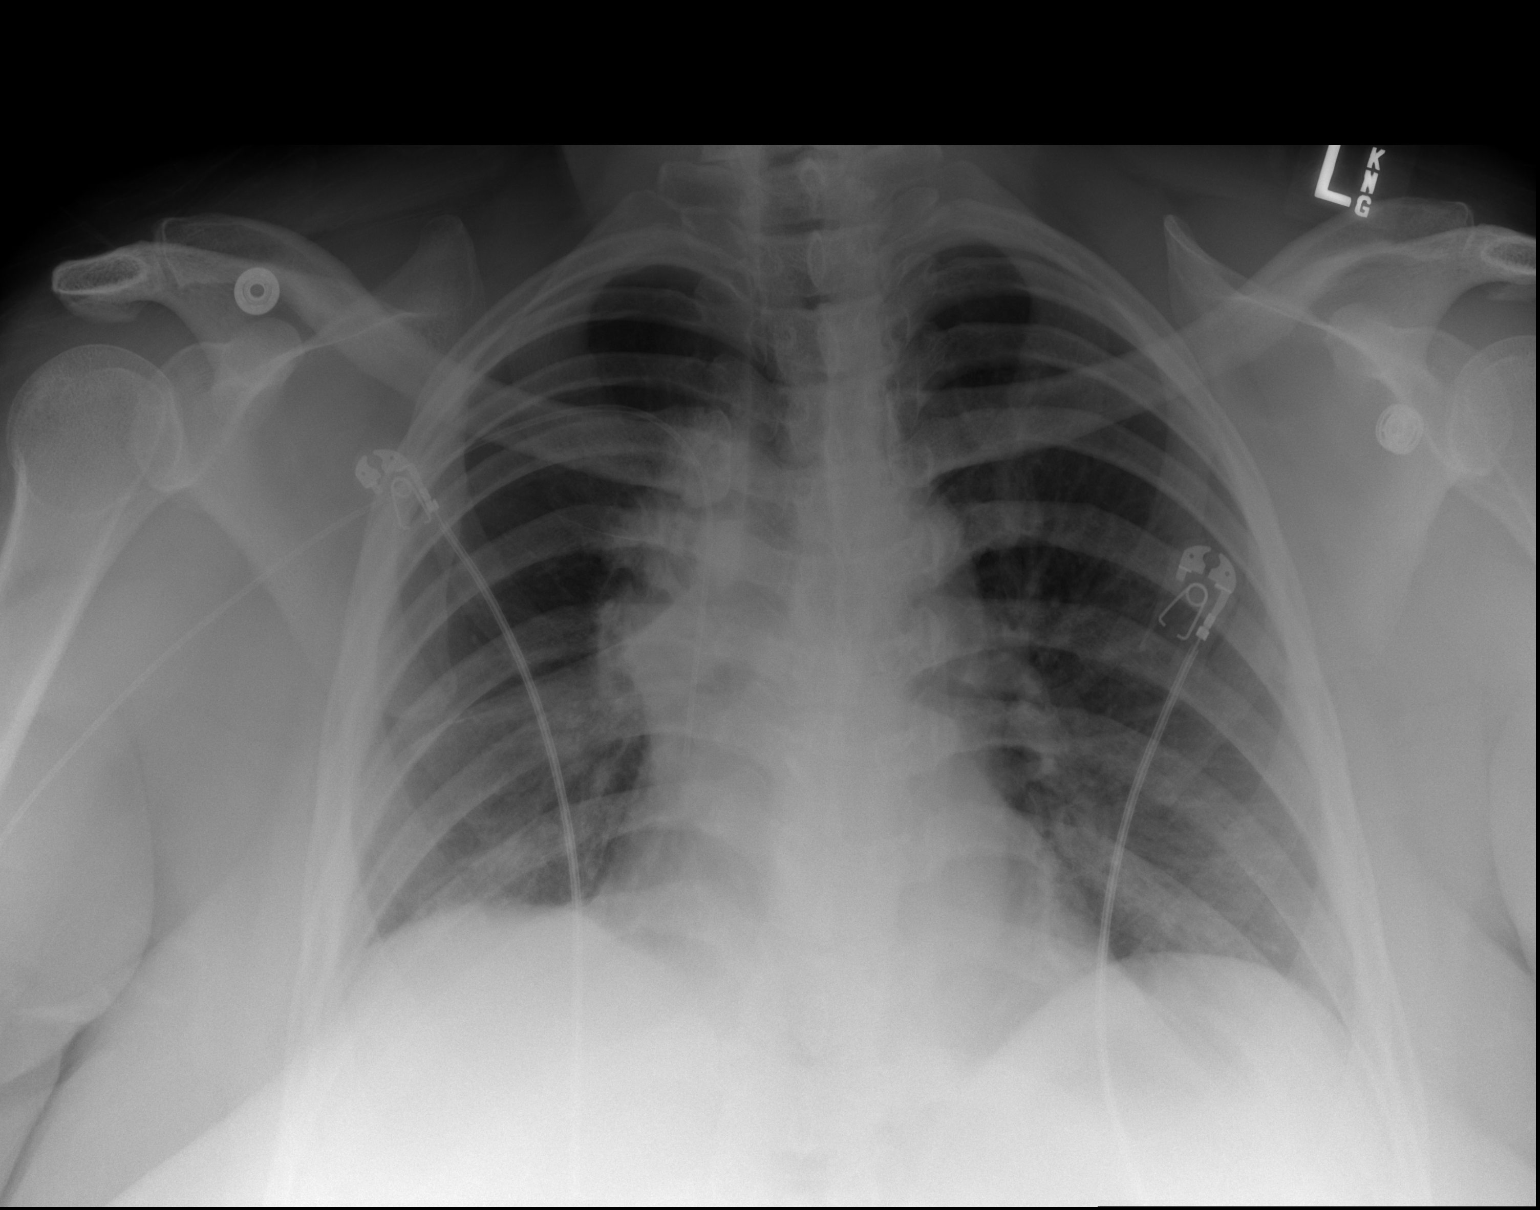

[2 of 2 positions shown; findings below may reference images not displayed]

FINDINGS: Tip of the right upper extremity central line in the mid SVC.
Chronic volume loss in the right hemithorax with right perihilar
opacity, stable. Unchanged heart size and mediastinal contours. No
focal consolidation, pleural fluid or pneumothorax.
IMPRESSION: Unchanged appearance of the chest. No evidence of acute abnormality.

## 2017-12-01 ENCOUNTER — Ambulatory Visit
Admission: RE | Admit: 2017-12-01 | Discharge: 2017-12-01 | Disposition: A | Discharge status: Home / Self Care | Payer: Medicaid Other | Source: Ambulatory Visit | Attending: Radiation Oncology | Admitting: Radiation Oncology

## 2017-12-01 DIAGNOSIS — Z510 Encounter for antineoplastic radiation therapy: Secondary | ICD-10-CM | POA: Diagnosis not present

## 2017-12-02 ENCOUNTER — Ambulatory Visit
Admission: RE | Admit: 2017-12-02 | Discharge: 2017-12-02 | Disposition: A | Discharge status: Home / Self Care | Payer: Medicaid Other | Source: Ambulatory Visit | Attending: Radiation Oncology | Admitting: Radiation Oncology

## 2017-12-02 DIAGNOSIS — Z510 Encounter for antineoplastic radiation therapy: Secondary | ICD-10-CM | POA: Diagnosis not present

## 2017-12-06 ENCOUNTER — Ambulatory Visit
Admission: RE | Admit: 2017-12-06 | Discharge: 2017-12-06 | Disposition: A | Discharge status: Home / Self Care | Payer: Medicaid Other | Source: Ambulatory Visit | Attending: Radiation Oncology | Admitting: Radiation Oncology

## 2017-12-06 DIAGNOSIS — C774 Secondary and unspecified malignant neoplasm of inguinal and lower limb lymph nodes: Secondary | ICD-10-CM | POA: Insufficient documentation

## 2017-12-06 DIAGNOSIS — C3491 Malignant neoplasm of unspecified part of right bronchus or lung: Secondary | ICD-10-CM | POA: Insufficient documentation

## 2017-12-06 DIAGNOSIS — Z510 Encounter for antineoplastic radiation therapy: Secondary | ICD-10-CM | POA: Insufficient documentation

## 2017-12-07 ENCOUNTER — Ambulatory Visit
Admission: RE | Admit: 2017-12-07 | Discharge: 2017-12-07 | Disposition: A | Discharge status: Home / Self Care | Payer: Medicaid Other | Source: Ambulatory Visit | Attending: Radiation Oncology | Admitting: Radiation Oncology

## 2017-12-07 DIAGNOSIS — Z510 Encounter for antineoplastic radiation therapy: Secondary | ICD-10-CM | POA: Diagnosis not present

## 2017-12-08 ENCOUNTER — Ambulatory Visit
Admission: RE | Admit: 2017-12-08 | Discharge: 2017-12-08 | Disposition: A | Discharge status: Home / Self Care | Payer: Medicaid Other | Source: Ambulatory Visit | Attending: Radiation Oncology | Admitting: Radiation Oncology

## 2017-12-08 DIAGNOSIS — Z510 Encounter for antineoplastic radiation therapy: Secondary | ICD-10-CM | POA: Diagnosis not present

## 2017-12-09 ENCOUNTER — Ambulatory Visit: Payer: Medicaid Other

## 2017-12-09 NOTE — Progress Notes (Signed)
Marilyn [name]    HEMATOLOGY/ONCOLOGY CLINIC NOTE  Date of Service: 12/12/17     Patient Care Team: [name], FNP as PCP - General (Nurse Practitioner) Pixie Casino, MD as PCP - Cardiology (Cardiology)  CHIEF COMPLAINTS  F/u for continued management of metastatic lung adenocarcinoma  Diagnosis Metastatic Lung Adenocarcinoma with rt flank subcutaneous metastasis.  Current treatment:  Nivolumab q2 weeks Palliative RT to pain rt inguinal mass   PreviousTreatment -Concurrent Chemo-radiation with Carboplatin/Taxol. Completed RT on 02/13/2016 and has then completed 2 cycles of carboplatin + taxol. -Started Maintenance Durvalumab from  05/24/2016 q2weeks, switched to Nivolumab q2weeks on 12/13/16 once metastatic disease noted -s/p palliative RT to rt sided Subcutaneous metastatic mass.    HISTORY OF PRESENTING ILLNESS: Plz see my previous note for details on initial presentation.  INTERVAL HISTORY   Marilyn [name] presents to the office today for followup of her metastatic lung cancer and dose . The patient's last visit with [place] was on 11/14/17. The pt reports that she is doing well overall. Since our last visit the pt has begun palliative radiation with Dr. Tammi Klippel.   The pt reports that her rash has returned to her labia. She also notes that a week or two weeks ago, she began feeling a pain in the back of her head when she lies down. She denies any changes in vision or new tingling or numbness. She continues on Cymbalta and has recently begun Gabapentin BID with her PCP. She also notes that she has run out of her pain medication for her groin and lower abdomen and needs a refill.  She has been taking 74m Toprol each day and her Synthroid medication, but does note that she feels a heart flutter after exertion.  Lab results today (12/12/17) of CBC w/diff, CMP is as follows: all values are WNL except for RBC at 3.58, HGB at 10.1, HCT at 30.8, Lymphs abs at 700, Glucose at 162, Albumin  at 3.1, AST at 12.  On review of systems, pt reports loose stools, stable energy levels, stable groin/lower abdominal pain, skin rash on labia, new head pain when lying down, and denies diarrhea, changes in vision, noticing any new lumps or bumps, pain along the spine, upper abdominal pains, leg swelling, and any other symptoms.    MEDICAL HISTORY:  Past Medical History:  Diagnosis Date  . Arthritis   . Asthma   . Diabetes mellitus    Type II  . Hemorrhoids   . Hypercholesteremia   . Hypertension   . Hypothyroidism   . Metastatic lung cancer (metastasis from lung to other site) (HCC)    Lung, Mets to skin- right flank. CT angio 2019 suspicious for axillary, external iliac, right inguinal mets  . 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 NICM ([place]ischemic cardiomyopathy) [place]    a. mild - EF 45-50% by echo 10/2016 but EF 52% by low risk nuc 11/2016. b. Echo 07/2017 - EF normal, mild AI, unremarkable echo otherwise.  . Obese   . Pneumonia 2017  . Sinus tachycardia    a. dates back to at least 2013, etiology unclear.    SURGICAL HISTORY: Past Surgical History:  Procedure Laterality Date  . CESAREAN SECTION    . COLONOSCOPY    . I/D  arm Right 2013  . IR FLUORO GUIDE PORT INSERTION RIGHT  02/18/2017  . IR GENERIC HISTORICAL  01/08/2016   IR [place] V[place] ACCESS RIGHT 01/08/2016 JCorrie Mckusick  DO [facility]-INTERV RAD  . IR GENERIC HISTORICAL  01/08/2016   IR FLUORO GUIDE CV LINE RIGHT 01/08/2016 [name], DO [facility]-INTER[facility] RAD  . IR US GUIDE VASC ACCESS RIGHT  02/18/2017  . MULTIPLE EXTRACTIONS WITH ALVEOLOPLASTY N/A 03/25/2017   Procedure: MULTIPLE EXTRACTION WITH ALVEOLOPLASTY (teeth #s four, six, seven, eight, nine, ten, eleven, one, twenty-three, twenty-four, twenty-five, twenty-six, twenty-nine, thirty);  Surgeon: [name], [name];  Location: [facility];  Service: Oral Surgery;  Laterality: N/A;  . TUBAL LIGATION    . US guided core needle biopsy     of right lower  neck/supraclavicular lymph nodes    SOCIAL HISTORY: Social History   Socioeconomic History  . Marital status: Divorced    Spouse name: Not on file  . Number of children: Not on file  . Years of education: Not on file  . Highest education level: Not on file  Occupational History  . Not on file  Social Needs  . Financial resource strain: Not on file  . Food insecurity:    Worry: Not on file    Inability: Not on file  . Transportation needs:    Medical: Not on file    Non-medical: Not on file  Tobacco Use  . Smoking status: Never Smoker  . Smokeless tobacco: Never Used  Substance and Sexual Activity  . Alcohol use: No  . Drug use: No  . Sexual activity: Not Currently  Lifestyle  . Physical activity:    Days per week: Not on file    Minutes per session: Not on file  . Stress: Not on file  Relationships  . Social connections:    Talks on phone: Not on file    Gets together: Not on file    Attends religious service: Not on file    Active member of club or organization: Not on file    Attends meetings of clubs or organizations: Not on file    Relationship status: Not on file  . Intimate partner violence:    Fear of current or ex partner: Not on file    Emotionally abused: Not on file    Physically abused: Not on file    Forced sexual activity: Not on file  Other Topics Concern  . Not on file  Social History Narrative   No bird or mold exposure. No recent travel.   11-23-17 Unable to ask abuse questions mother with her today.    FAMILY HISTORY: Family History  Problem Relation Age of Onset  . Heart failure Mother   . Hypertension Mother   . Cancer Neg Hx   . Rheumatologic disease Neg Hx     ALLERGIES:  has No Known Allergies.  MEDICATIONS:  Current Outpatient Medications  Medication Sig Dispense Refill  . aspirin EC 81 MG tablet Take 1 tablet (81 mg total) by mouth daily. 60 tablet 2  . clindamycin (CLINDAGEL) 1 % gel Apply topically 2 (two) times daily.  To skin lesions in perineum -- folliculitis (Patient not taking: Reported on 11/23/2017) 30 g 0  . clotrimazole (CLOTRIMAZOLE ANTI-FUNGAL) 1 % cream Apply 1 application topically 2 (two) times daily. 30 g 0  . DULoxetine (CYMBALTA) 60 MG capsule Take 1 capsule (60 mg total) by mouth daily. 30 capsule 2  . insulin aspart (NOVOLOG) 100 UNIT/ML FlexPen As per sliding scale instructions (Patient taking differently: Inject 0-10 Units into the skin 3 (three) times daily. As per sliding scale instructions) 15 mL 2  . Insulin Degludec (TRESIBA FLEXTOUCH) 200  UNIT/ML SOPN Inject 100 Units into the skin daily before breakfast.     . ketoconazole (NIZORAL) 2 % cream Apply to rash once daily. Keep area dry between applications. (Patient not taking: Reported on 11/23/2017) 60 g 0  . levothyroxine (SYNTHROID, LEVOTHROID) 150 MCG tablet Take 1 tablet (150 mcg total) by mouth daily before breakfast. 30 tablet 2  . levothyroxine (SYNTHROID, LEVOTHROID) 150 MCG tablet TAKE 1 TABLET BY MOUTH EVERY DAY BEFORE BREAKFAST 30 tablet 2  . lidocaine-prilocaine (EMLA) cream Apply 1 application topically as needed. 30 g 6  . loratadine (CLARITIN) 10 MG tablet Take 10 mg by mouth daily.    . methocarbamol (ROBAXIN) 500 MG tablet Take 1 tablet (500 mg total) by mouth every 8 (eight) hours as needed for muscle spasms. (Patient not taking: Reported on 11/23/2017) 8 tablet 0  . metoprolol succinate (TOPROL-XL) 50 MG 24 hr tablet Take 1 tablet (50 mg total) by mouth daily. Take with or immediately following a meal. 30 tablet 5  . miconazole (MICOTIN) 2 % powder Apply topically 2 (two) times daily. Apply to area of rash over the perineum (Patient not taking: Reported on 11/23/2017) 85 g 1  . naproxen (NAPROSYN) 500 MG tablet Take 1 tablet (500 mg total) by mouth 2 (two) times daily with a meal. 20 tablet 0  . omeprazole (PRILOSEC) 20 MG capsule Take 1 capsule (20 mg total) by mouth daily. (Patient not taking: Reported on 11/23/2017) 30  capsule 1  . oxyCODONE-acetaminophen (PERCOCET) 5-325 MG tablet Take 1 tablet by mouth every 6 (six) hours as needed for moderate pain or severe pain. 50 tablet 0  . polyethylene glycol (MIRALAX) packet Take 17 g by mouth daily. 30 each 1  . senna-docusate (SENNA S) 8.6-50 MG tablet Take 2 tablets by mouth 2 (two) times daily. May reduce to 2 tab po HS as needed once regular BM estabished (Patient taking differently: Take 2 tablets by mouth 2 (two) times daily as needed (FOR CONSTIPATION). =) 60 tablet 1  . traZODone (DESYREL) 50 MG tablet Take 1-2 tablets (50-100 mg total) by mouth at bedtime as needed for sleep. (Patient not taking: Reported on 11/23/2017) 60 tablet 0   No current facility-administered medications for this visit.    Facility-Administered Medications Ordered in Other Visits  Medication Dose Route Frequency Provider Last Rate Last Dose  . sodium chloride 0.9 % injection 10 mL  10 mL Intravenous PRN [name], MD   10 mL at 05/24/16 1252    REVIEW OF SYSTEMS:    A 10+ POINT REVIEW OF SYSTEMS WAS OBTAINED including neurology, dermatology, psychiatry, cardiac, respiratory, lymph, extremities, GI, GU, Musculoskeletal, constitutional, breasts, reproductive, HEENT.  All pertinent positives are noted in the HPI.  All others are negative.   PHYSICAL EXAMINATION: ECOG PERFORMANCE STATUS: 1 - Symptomatic but completely ambulatory  Vitals:   12/12/17 0842  BP: 127/88  Pulse: (!) 108  Resp: 18  Temp: 98 F (36.7 C)  SpO2: 97%   Filed Weights   12/12/17 0842  Weight: 276 lb 6.4 oz (125.4 kg)   .Body mass index is 40.82 kg/m.  GENERAL:alert, in no acute distress and comfortable SKIN: no acute rashes, no significant lesions EYES: conjunctiva are pink and non-injected, sclera anicteric OROPHARYNX: MMM, no exudates, no oropharyngeal erythema or ulceration NECK: supple, no JVD LYMPH:  no palpable lymphadenopathy in the cervical, axillary or inguinal regions LUNGS:  clear to auscultation b/l with normal respiratory effort HEART: regular rate & rhythm  ABDOMEN:  normoactive bowel sounds , non tender, not distended. No palpable hepatosplenomegaly.  Extremity: no pedal edema PSYCH: alert & oriented x 3 with fluent speech NEURO: no focal motor/sensory deficits   LABORATORY DATA: .  Marilyn [name] CBC Latest Ref Rng & Units 12/12/2017 11/28/2017 11/14/2017  WBC 3.9 - 10.3 K/uL 4.2 4.8 5.6  Hemoglobin 11.6 - 15.9 g/dL 10.1(L) 9.9(L) 10.1(L)  Hematocrit 34.8 - 46.6 % 30.8(L) 29.9(L) 31.1(L)  Platelets 145 - 400 K/uL 253 267 288  HGB 9.5  . CMP Latest Ref Rng & Units 12/12/2017 11/28/2017 11/14/2017  Glucose 70 - 99 mg/dL 162(H) 346(H) 218(H)  BUN 6 - 20 mg/dL '8 8 9  ' Creatinine 0.44 - 1.00 mg/dL 0.71 0.85 0.75  Sodium 135 - 145 mmol/L 144 136 137  Potassium 3.5 - 5.1 mmol/L 4.0 3.9 4.0  Chloride 98 - 111 mmol/L 105 102 101  CO2 22 - 32 mmol/L '29 28 26  ' Calcium 8.9 - 10.3 mg/dL 8.9 8.6(L) 8.6(L)  Total Protein 6.5 - 8.1 g/dL 7.3 7.3 7.6  Total Bilirubin 0.3 - 1.2 mg/dL 0.3 <0.2(L) 0.3  Alkaline Phos 38 - 126 U/L 94 95 99  AST 15 - 41 U/L 12(L) 11(L) 12(L)  ALT 0 - 44 U/L '8 10 11               ' 08/18/17 Bx:   08/16/17 Molecular Pathology:    RADIOGRAPHIC STUDIES:  .No results found.   ASSESSMENT & PLAN:   52 y.o.  African-American female with history of hypertension, diabetes, asthma, dysuria mass and morbid obesity with    #1 h/o Lung Adenocarcinoma Rt sided at least Stage IIIB (on diagnosis) with large right paratracheal mass with mediastinal adenopathy that appears to have grown significantly over the last 6-7 months and rt supraclavicular LN +.  -Noted to have a small mass in the left adrenal on CT but PET/CT neg for metastatic disease. Patient has been a lifelong nonsmoker -MRI of the brain was negative for any metastatic disease. At diagnosis. -High PDL1 expression (90%) on foundation One Neg for EGFR, ALK, ROS-1 and BRAF  mutations. -Patient completed her planned definitive chemoradiation with Carbo Taxol on 02/13/2016. No prohibitive toxicities other than some grade 1 skin desquamation and some grade 1-2 radiation esophagitis. She has subsequently received 2 out of 2 planned dose of carboplatin + Taxol. -CTA chest 10/29/2016  no evidence of lung cancer progression in the chest. No PE -CT abd/pelvis-10/29/2016  Interval 5.0 x 5.0 x 4.7 cm mass in the right lateral subcutaneous fat at the level of the mid abdomen. This has CT features compatible with a metastasis or primary neoplasm. -CT C/A/P (05/12/2017): Interval development of right axillary, right external iliac and right inguinal adenopathy. Suspicious for nodal metastasis. 2. Decrease in size of right paratracheal adenopathy. 3. Decrease in size and enhancement associated with right lateral body wall lesion. 4. New small nonspecific pulmonary nodule in the left lower lobe measuring 6 mm. 5. Stable appearance of changes secondary to external beam radiation within the right lung  08/03/17 PET which revealed 2.8 cm right axillary lymph node is markedly hypermetabolic and consistent with metastatic disease. Extensive radiation changes involving the right paramediastinal lung. There is a focus of hypermetabolism in the right lower lobe which is suspicious for residual or recurrent tumor.  11/11/17 CT C/A/P revealed There has been interval increase in number of enlarged of right axillary nodes and increase in size of right inguinal adenopathy compatible with progression of disease.  Stable appearance of the right lung with changes of external beam radiation.    #2 Now with Metastatic lung adenocarcinoma with isolated metastases in the right lateral subcutaneous fat at the level of the midabdomen. she received maintenance Durvalumab from  05/24/2016 q2weeks, switched to Nivolumab q2weeks on 12/13/16 in the setting of Biopsy proven isolated metastatic disease. Given the tumor is  strongly PDL1 positive and patient has only isolated metastatic disease was treated with palliative RT to metastases with significant improvement. Molecular pathology from 515/19 did not reveal obviously targetable mutations with NF1 and TP53 mutations present.  Plan:  -No meaningful progression of symptoms or solid organ involvement  -Discussed pt labwork today, 12/12/17; blood counts and chemistries are stable -The pt has no prohibitive toxicities from continuing Nivolumab at this time.   -Will refill antibiotic cream and Percocet today -Advised that pt continue follow up with her cadiologist -Continue follow up with RT for palliative RT to rt inguinal mass -MRI Aaron Edelman and Cervical spine for new head/neck pain    #3 h/o radiation pneumonitis - no new symptoms. Does has some cough with exertion but this has improved.  #4 Improving fatigue (grade 1) - improved fatigue with levothyroxine compliance with near normalization of FT4 levels.  - previously given TSH of 12 -- increased Levolthyroxine from 140mg to 1549m  ---will likely need larger dose but in the setting of baseline tachycardia we are gradually increasing dose to avoid ppt cardiac arrhythmias.  -TSH today WNL. #5 normocytic anemia related to chemotherapy -resolving and stable, HGB at 10.1  #6 Left chest wall pain due to costochondritis - mx with pain medications. CTA x 3 neg for PE. No significant pain at this time.  #7 Sinus tachycardia - on BB per cards , no PE on CTA x 3 , -Still having sinus tachycardia . Partly from deconditioning and anemia. Seeing Dr. HiDebara Pickettrom cardiology  -Continues to be on BB  #8 neoplasm related pain is currently controlled without significant medications. Clinically likely has sleep apnea . Would need to be careful with sedative medications . 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 -trazodone 50-100m39m HS prn   -continue Nivolumab q2weeks with labs RTC with Dr KaleIrene Limbo4 weeks with labs -MRI Brain and MRI C spine in 2-3 days    All of the patients questions were answered with apparent satisfaction. The patient knows to call the clinic with any problems, questions or concerns.  The total time spent in the appt was 25 minutes and more than 50% was on counseling and direct patient cares.    GautSullivan LoneMS AAHIVMS SCH [facility] Hospital[facility]ology/Oncology Physician [facility]):       [phone] cell):  [phone]           [phone] SchuBaldwin [place] acting as a scribe for Dr. KaleIrene Limbo have reviewed the above documentation for accuracy and completeness, and I agree with the above. .GauBrunetta Genera

## 2017-12-12 ENCOUNTER — Inpatient Hospital Stay: Payer: Medicaid Other | Attending: Hematology

## 2017-12-12 ENCOUNTER — Inpatient Hospital Stay: Payer: Medicaid Other | Admitting: Hematology

## 2017-12-12 ENCOUNTER — Encounter: Payer: Self-pay | Admitting: Hematology

## 2017-12-12 ENCOUNTER — Inpatient Hospital Stay (HOSPITAL_BASED_OUTPATIENT_CLINIC_OR_DEPARTMENT_OTHER): Payer: Medicaid Other

## 2017-12-12 ENCOUNTER — Other Ambulatory Visit: Payer: Self-pay | Admitting: Hematology

## 2017-12-12 ENCOUNTER — Inpatient Hospital Stay: Payer: Medicaid Other

## 2017-12-12 ENCOUNTER — Ambulatory Visit
Admission: RE | Admit: 2017-12-12 | Discharge: 2017-12-12 | Disposition: A | Discharge status: Home / Self Care | Payer: Medicaid Other | Source: Ambulatory Visit | Attending: Radiation Oncology | Admitting: Radiation Oncology

## 2017-12-12 VITALS — HR 100

## 2017-12-12 VITALS — BP 127/88 | HR 108 | Temp 98.0°F | Resp 18 | Ht 69.0 in | Wt 276.4 lb

## 2017-12-12 DIAGNOSIS — I429 Cardiomyopathy, unspecified: Secondary | ICD-10-CM | POA: Diagnosis not present

## 2017-12-12 DIAGNOSIS — E669 Obesity, unspecified: Secondary | ICD-10-CM

## 2017-12-12 DIAGNOSIS — R000 Tachycardia, unspecified: Secondary | ICD-10-CM | POA: Diagnosis not present

## 2017-12-12 DIAGNOSIS — J45909 Unspecified asthma, uncomplicated: Secondary | ICD-10-CM | POA: Diagnosis not present

## 2017-12-12 DIAGNOSIS — C3491 Malignant neoplasm of unspecified part of right bronchus or lung: Secondary | ICD-10-CM

## 2017-12-12 DIAGNOSIS — Z794 Long term (current) use of insulin: Secondary | ICD-10-CM

## 2017-12-12 DIAGNOSIS — R21 Rash and other nonspecific skin eruption: Secondary | ICD-10-CM

## 2017-12-12 DIAGNOSIS — Z9221 Personal history of antineoplastic chemotherapy: Secondary | ICD-10-CM

## 2017-12-12 DIAGNOSIS — C799 Secondary malignant neoplasm of unspecified site: Secondary | ICD-10-CM | POA: Insufficient documentation

## 2017-12-12 DIAGNOSIS — E119 Type 2 diabetes mellitus without complications: Secondary | ICD-10-CM | POA: Insufficient documentation

## 2017-12-12 DIAGNOSIS — D6481 Anemia due to antineoplastic chemotherapy: Secondary | ICD-10-CM | POA: Insufficient documentation

## 2017-12-12 DIAGNOSIS — C792 Secondary malignant neoplasm of skin: Secondary | ICD-10-CM

## 2017-12-12 DIAGNOSIS — M542 Cervicalgia: Secondary | ICD-10-CM

## 2017-12-12 DIAGNOSIS — E039 Hypothyroidism, unspecified: Secondary | ICD-10-CM

## 2017-12-12 DIAGNOSIS — Z5112 Encounter for antineoplastic immunotherapy: Secondary | ICD-10-CM

## 2017-12-12 DIAGNOSIS — R51 Headache: Secondary | ICD-10-CM

## 2017-12-12 DIAGNOSIS — Z923 Personal history of irradiation: Secondary | ICD-10-CM

## 2017-12-12 DIAGNOSIS — Z7982 Long term (current) use of aspirin: Secondary | ICD-10-CM

## 2017-12-12 DIAGNOSIS — G4700 Insomnia, unspecified: Secondary | ICD-10-CM | POA: Insufficient documentation

## 2017-12-12 DIAGNOSIS — E7800 Pure hypercholesterolemia, unspecified: Secondary | ICD-10-CM

## 2017-12-12 DIAGNOSIS — Z95828 Presence of other vascular implants and grafts: Secondary | ICD-10-CM

## 2017-12-12 DIAGNOSIS — E1140 Type 2 diabetes mellitus with diabetic neuropathy, unspecified: Secondary | ICD-10-CM | POA: Insufficient documentation

## 2017-12-12 DIAGNOSIS — I498 Other specified cardiac arrhythmias: Secondary | ICD-10-CM | POA: Diagnosis not present

## 2017-12-12 DIAGNOSIS — G629 Polyneuropathy, unspecified: Secondary | ICD-10-CM | POA: Insufficient documentation

## 2017-12-12 DIAGNOSIS — M940 Chondrocostal junction syndrome [Tietze]: Secondary | ICD-10-CM

## 2017-12-12 DIAGNOSIS — Z510 Encounter for antineoplastic radiation therapy: Secondary | ICD-10-CM | POA: Diagnosis not present

## 2017-12-12 DIAGNOSIS — Z452 Encounter for adjustment and management of vascular access device: Secondary | ICD-10-CM

## 2017-12-12 DIAGNOSIS — G893 Neoplasm related pain (acute) (chronic): Secondary | ICD-10-CM | POA: Insufficient documentation

## 2017-12-12 DIAGNOSIS — I10 Essential (primary) hypertension: Secondary | ICD-10-CM

## 2017-12-12 DIAGNOSIS — R519 Headache, unspecified: Secondary | ICD-10-CM

## 2017-12-12 LAB — CMP (CANCER CENTER ONLY)
ALBUMIN: 3.1 g/dL — AB (ref 3.5–5.0)
ALK PHOS: 94 U/L (ref 38–126)
ALT: 8 U/L (ref 0–44)
ANION GAP: 10 (ref 5–15)
AST: 12 U/L — AB (ref 15–41)
BUN: 8 mg/dL (ref 6–20)
CO2: 29 mmol/L (ref 22–32)
CREATININE: 0.71 mg/dL (ref 0.44–1.00)
Calcium: 8.9 mg/dL (ref 8.9–10.3)
Chloride: 105 mmol/L (ref 98–111)
GFR, Est AFR Am: >60 mL/min (ref >60)
GFR, Estimated: >60 mL/min (ref >60)
GLUCOSE: 162 mg/dL — AB (ref 70–99)
Potassium: 4 mmol/L (ref 3.5–5.1)
SODIUM: 144 mmol/L (ref 135–145)
TOTAL PROTEIN: 7.3 g/dL (ref 6.5–8.1)
Total Bilirubin: 0.3 mg/dL (ref 0.3–1.2)

## 2017-12-12 LAB — CBC WITH DIFFERENTIAL/PLATELET
BASOS ABS: 0 10*3/uL (ref 0.0–0.1)
BASOS PCT: 0 %
Eosinophils Absolute: 0.2 10*3/uL (ref 0.0–0.5)
Eosinophils Relative: 5 %
HEMATOCRIT: 30.8 % — AB (ref 34.8–46.6)
HEMOGLOBIN: 10.1 g/dL — AB (ref 11.6–15.9)
Lymphocytes Relative: 17 %
Lymphs Abs: 0.7 10*3/uL — ABNORMAL LOW (ref 0.9–3.3)
MCH: 28.3 pg (ref 25.1–34.0)
MCHC: 32.8 g/dL (ref 31.5–36.0)
MCV: 86.2 fL (ref 79.5–101.0)
Monocytes Absolute: 0.3 10*3/uL (ref 0.1–0.9)
Monocytes Relative: 7 %
NEUTROS ABS: 3 10*3/uL (ref 1.5–6.5)
NEUTROS PCT: 71 %
Platelets: 253 10*3/uL (ref 145–400)
RBC: 3.58 MIL/uL — AB (ref 3.70–5.45)
RDW: 13.6 % (ref 11.2–14.5)
WBC: 4.2 10*3/uL (ref 3.9–10.3)

## 2017-12-12 MED ORDER — CLINDAMYCIN PHOSPHATE 1 % EX GEL: Freq: Two times a day (BID) | CUTANEOUS | 0 refills | Status: DC

## 2017-12-12 MED ORDER — SODIUM CHLORIDE 0.9 % IV SOLN
Freq: Once | INTRAVENOUS | Status: AC
  Administered 2017-12-12: 10:00:00 via INTRAVENOUS
  Filled 2017-12-12: qty 250

## 2017-12-12 MED ORDER — HEPARIN SOD (PORK) LOCK FLUSH 100 UNIT/ML IV SOLN
500.0000 [IU] | Freq: Once | INTRAVENOUS | Status: AC | PRN
  Administered 2017-12-12: 500 [IU]
  Filled 2017-12-12: qty 5

## 2017-12-12 MED ORDER — SODIUM CHLORIDE 0.9% FLUSH
10.0000 mL | INTRAVENOUS | Status: DC | PRN
  Administered 2017-12-12: 10 mL
  Filled 2017-12-12: qty 10

## 2017-12-12 MED ORDER — CLOTRIMAZOLE 1 % EX CREA: 1.0000 "application " | TOPICAL_CREAM | Freq: Two times a day (BID) | CUTANEOUS | 0 refills | Status: DC

## 2017-12-12 MED ORDER — SODIUM CHLORIDE 0.9 % IV SOLN
240.0000 mg | Freq: Once | INTRAVENOUS | Status: AC
  Administered 2017-12-12: 240 mg via INTRAVENOUS
  Filled 2017-12-12: qty 24

## 2017-12-12 MED ORDER — OXYCODONE-ACETAMINOPHEN 5-325 MG PO TABS: 1.0000 | ORAL_TABLET | Freq: Four times a day (QID) | ORAL | 0 refills | Status: DC | PRN

## 2017-12-12 MED ORDER — SODIUM CHLORIDE 0.9 % IJ SOLN
10.0000 mL | INTRAMUSCULAR | Status: DC | PRN
  Administered 2017-12-12: 10 mL via INTRAVENOUS
  Filled 2017-12-12: qty 10

## 2017-12-12 NOTE — Patient Instructions (Signed)
[facility] Discharge Instructions for Patients Receiving Chemotherapy  Today you received the following chemotherapy agents Opdivo  To help prevent nausea and vomiting after your treatment, we encourage you to take your nausea medication as directed   If you develop nausea and vomiting that is not controlled by your nausea medication, call the clinic.   BELOW ARE SYMPTOMS THAT SHOULD BE REPORTED IMMEDIATELY:  *FEVER GREATER THAN 100.5 F  *CHILLS WITH OR WITHOUT FEVER  NAUSEA AND VOMITING THAT IS NOT CONTROLLED WITH YOUR NAUSEA MEDICATION  *UNUSUAL SHORTNESS OF BREATH  *UNUSUAL BRUISING OR BLEEDING  TENDERNESS IN MOUTH AND THROAT WITH OR WITHOUT PRESENCE OF ULCERS  *URINARY PROBLEMS  *BOWEL PROBLEMS  UNUSUAL RASH Items with * indicate a potential emergency and should be followed up as soon as possible.  Feel free to call the clinic should you have any questions or concerns. The clinic phone number is [phone].  Please show the CHEMO ALERT CARD at check-in to the Emergency Department and triage nurse.   

## 2017-12-13 ENCOUNTER — Ambulatory Visit
Admission: RE | Admit: 2017-12-13 | Discharge: 2017-12-13 | Disposition: A | Discharge status: Home / Self Care | Payer: Medicaid Other | Source: Ambulatory Visit | Attending: Radiation Oncology | Admitting: Radiation Oncology

## 2017-12-13 DIAGNOSIS — Z510 Encounter for antineoplastic radiation therapy: Secondary | ICD-10-CM | POA: Diagnosis not present

## 2017-12-14 ENCOUNTER — Ambulatory Visit
Admission: RE | Admit: 2017-12-14 | Discharge: 2017-12-14 | Disposition: A | Discharge status: Home / Self Care | Payer: Medicaid Other | Source: Ambulatory Visit | Attending: Radiation Oncology | Admitting: Radiation Oncology

## 2017-12-14 ENCOUNTER — Ambulatory Visit: Payer: Medicaid Other

## 2017-12-14 DIAGNOSIS — Z510 Encounter for antineoplastic radiation therapy: Secondary | ICD-10-CM | POA: Diagnosis not present

## 2017-12-15 ENCOUNTER — Encounter: Payer: Self-pay | Admitting: Radiation Oncology

## 2017-12-15 ENCOUNTER — Ambulatory Visit
Admission: RE | Admit: 2017-12-15 | Discharge: 2017-12-15 | Disposition: A | Discharge status: Home / Self Care | Payer: Medicaid Other | Source: Ambulatory Visit | Attending: Radiation Oncology | Admitting: Radiation Oncology

## 2017-12-15 DIAGNOSIS — Z510 Encounter for antineoplastic radiation therapy: Secondary | ICD-10-CM | POA: Diagnosis not present

## 2017-12-15 NOTE — Progress Notes (Signed)
  Radiation Oncology         [phone] ________________________________  Name: Marilyn [name] MRN: [id number]  Date: 12/15/2017  DOB: 1965-08-22  End of Treatment Note  Diagnosis:   52 y.o. 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  Beams/energy:   3D / 10X, 15X photons  Narrative: The patient tolerated radiation treatment relatively well.  She experienced mild fatigue and some urinary irritation with frequency, urgency, and incomplete emptying. She denied dysuria or hematuria. She did not have any rectal bleeding, constipation, or diarrhea.  Plan: The patient has completed radiation treatment. The patient will return to radiation oncology clinic for routine followup in one month. I advised her to call or return sooner if she has any questions or concerns related to her recovery or treatment. ________________________________  Sheral Apley. Tammi Klippel, M.D.  This document serves as a record of services personally performed by Tyler Pita, MD. It was created on his behalf by Rae Lips, a trained medical scribe. The creation of this record is based on the scribe's personal observations and the provider's statements to them. This document has been checked and approved by the attending provider.

## 2017-12-16 ENCOUNTER — Other Ambulatory Visit: Payer: Self-pay | Admitting: Hematology

## 2017-12-21 ENCOUNTER — Ambulatory Visit (HOSPITAL_COMMUNITY): Admission: RE | Admit: 2017-12-21 | Payer: Medicaid Other | Source: Ambulatory Visit

## 2017-12-21 ENCOUNTER — Other Ambulatory Visit: Payer: Self-pay | Admitting: Hematology

## 2017-12-21 ENCOUNTER — Ambulatory Visit (HOSPITAL_COMMUNITY)
Admission: RE | Admit: 2017-12-21 | Discharge: 2017-12-21 | Disposition: A | Discharge status: Home / Self Care | Payer: Medicaid Other | Source: Ambulatory Visit | Attending: Hematology | Admitting: Hematology

## 2017-12-21 DIAGNOSIS — M50322 Other cervical disc degeneration at C5-C6 level: Secondary | ICD-10-CM | POA: Insufficient documentation

## 2017-12-21 DIAGNOSIS — M50222 Other cervical disc displacement at C5-C6 level: Secondary | ICD-10-CM | POA: Diagnosis not present

## 2017-12-21 DIAGNOSIS — C3491 Malignant neoplasm of unspecified part of right bronchus or lung: Secondary | ICD-10-CM | POA: Diagnosis not present

## 2017-12-21 DIAGNOSIS — R519 Headache, unspecified: Secondary | ICD-10-CM

## 2017-12-21 DIAGNOSIS — R51 Headache: Secondary | ICD-10-CM | POA: Diagnosis present

## 2017-12-21 DIAGNOSIS — M4802 Spinal stenosis, cervical region: Secondary | ICD-10-CM | POA: Diagnosis not present

## 2017-12-21 MED ORDER — GADOBUTROL 1 MMOL/ML IV SOLN
10.0000 mL | Freq: Once | INTRAVENOUS | Status: AC | PRN
  Administered 2017-12-21: 10 mL via INTRAVENOUS

## 2017-12-26 ENCOUNTER — Inpatient Hospital Stay: Payer: Medicaid Other

## 2017-12-26 VITALS — BP 133/88 | HR 125 | Resp 20

## 2017-12-26 DIAGNOSIS — Z5112 Encounter for antineoplastic immunotherapy: Secondary | ICD-10-CM | POA: Diagnosis not present

## 2017-12-26 DIAGNOSIS — C3491 Malignant neoplasm of unspecified part of right bronchus or lung: Secondary | ICD-10-CM

## 2017-12-26 DIAGNOSIS — C792 Secondary malignant neoplasm of skin: Secondary | ICD-10-CM

## 2017-12-26 DIAGNOSIS — Z95828 Presence of other vascular implants and grafts: Secondary | ICD-10-CM

## 2017-12-26 LAB — CBC WITH DIFFERENTIAL (CANCER CENTER ONLY)
BASOS ABS: 0 10*3/uL (ref 0.0–0.1)
Basophils Relative: 0 %
EOS PCT: 5 %
Eosinophils Absolute: 0.2 10*3/uL (ref 0.0–0.5)
HCT: 30.5 % — ABNORMAL LOW (ref 34.8–46.6)
Hemoglobin: 9.8 g/dL — ABNORMAL LOW (ref 11.6–15.9)
Lymphocytes Relative: 18 %
Lymphs Abs: 0.8 10*3/uL — ABNORMAL LOW (ref 0.9–3.3)
MCH: 28.6 pg (ref 25.1–34.0)
MCHC: 32.1 g/dL (ref 31.5–36.0)
MCV: 88.9 fL (ref 79.5–101.0)
Monocytes Absolute: 0.3 10*3/uL (ref 0.1–0.9)
Monocytes Relative: 7 %
NEUTROS ABS: 3.3 10*3/uL (ref 1.5–6.5)
Neutrophils Relative %: 70 %
PLATELETS: 210 10*3/uL (ref 145–400)
RBC: 3.43 MIL/uL — AB (ref 3.70–5.45)
RDW: 13 % (ref 11.2–14.5)
WBC: 4.6 10*3/uL (ref 3.9–10.3)

## 2017-12-26 LAB — CMP (CANCER CENTER ONLY)
ALK PHOS: 92 U/L (ref 38–126)
ALT: 10 U/L (ref 0–44)
AST: 12 U/L — AB (ref 15–41)
Albumin: 3 g/dL — ABNORMAL LOW (ref 3.5–5.0)
Anion gap: 8 (ref 5–15)
BUN: 11 mg/dL (ref 6–20)
CHLORIDE: 104 mmol/L (ref 98–111)
CO2: 27 mmol/L (ref 22–32)
Calcium: 8.6 mg/dL — ABNORMAL LOW (ref 8.9–10.3)
Creatinine: 0.78 mg/dL (ref 0.44–1.00)
GFR, Estimated: >60 mL/min (ref >60)
Glucose, Bld: 259 mg/dL — ABNORMAL HIGH (ref 70–99)
Potassium: 3.9 mmol/L (ref 3.5–5.1)
SODIUM: 139 mmol/L (ref 135–145)
TOTAL PROTEIN: 7.3 g/dL (ref 6.5–8.1)
Total Bilirubin: <0.2 mg/dL — ABNORMAL LOW (ref 0.3–1.2)

## 2017-12-26 LAB — RETICULOCYTES
RBC.: 3.43 MIL/uL — AB (ref 3.70–5.45)
RETIC COUNT ABSOLUTE: 51.5 10*3/uL (ref 33.7–90.7)
RETIC CT PCT: 1.5 % (ref 0.7–2.1)

## 2017-12-26 MED ORDER — SODIUM CHLORIDE 0.9 % IV SOLN
240.0000 mg | Freq: Once | INTRAVENOUS | Status: AC
  Administered 2017-12-26: 240 mg via INTRAVENOUS
  Filled 2017-12-26: qty 24

## 2017-12-26 MED ORDER — SODIUM CHLORIDE 0.9 % IV SOLN
Freq: Once | INTRAVENOUS | Status: AC
  Administered 2017-12-26: 11:00:00 via INTRAVENOUS
  Filled 2017-12-26: qty 250

## 2017-12-26 MED ORDER — SODIUM CHLORIDE 0.9% FLUSH
10.0000 mL | INTRAVENOUS | Status: DC | PRN
  Administered 2017-12-26: 10 mL via INTRAVENOUS
  Filled 2017-12-26: qty 10

## 2017-12-26 MED ORDER — SODIUM CHLORIDE 0.9% FLUSH
10.0000 mL | INTRAVENOUS | Status: DC | PRN
  Administered 2017-12-26: 10 mL
  Filled 2017-12-26: qty 10

## 2017-12-26 MED ORDER — HEPARIN SOD (PORK) LOCK FLUSH 100 UNIT/ML IV SOLN
500.0000 [IU] | Freq: Once | INTRAVENOUS | Status: AC | PRN
  Administered 2017-12-26: 500 [IU]
  Filled 2017-12-26: qty 5

## 2017-12-26 NOTE — Patient Instructions (Signed)
[facility] Discharge Instructions for Patients Receiving Chemotherapy  Today you received the following chemotherapy agents Opdivo  To help prevent nausea and vomiting after your treatment, we encourage you to take your nausea medication as directed   If you develop nausea and vomiting that is not controlled by your nausea medication, call the clinic.   BELOW ARE SYMPTOMS THAT SHOULD BE REPORTED IMMEDIATELY:  *FEVER GREATER THAN 100.5 F  *CHILLS WITH OR WITHOUT FEVER  NAUSEA AND VOMITING THAT IS NOT CONTROLLED WITH YOUR NAUSEA MEDICATION  *UNUSUAL SHORTNESS OF BREATH  *UNUSUAL BRUISING OR BLEEDING  TENDERNESS IN MOUTH AND THROAT WITH OR WITHOUT PRESENCE OF ULCERS  *URINARY PROBLEMS  *BOWEL PROBLEMS  UNUSUAL RASH Items with * indicate a potential emergency and should be followed up as soon as possible.  Feel free to call the clinic should you have any questions or concerns. The clinic phone number is [phone].  Please show the CHEMO ALERT CARD at check-in to the Emergency Department and triage nurse.   

## 2017-12-26 NOTE — Progress Notes (Signed)
Per Dr. Irene Limbo, [place] to tx with elevated HR.

## 2018-01-03 ENCOUNTER — Encounter: Payer: Self-pay | Admitting: Hematology

## 2018-01-03 DIAGNOSIS — Z7189 Other specified counseling: Secondary | ICD-10-CM | POA: Insufficient documentation

## 2018-01-06 NOTE — Progress Notes (Signed)
[name]    HEMATOLOGY/ONCOLOGY CLINIC NOTE  Date of Service: 01/06/18     Patient Care Team: [name], FNP as PCP - General (Nurse Practitioner) Pixie Casino, MD as PCP - Cardiology (Cardiology)  CHIEF COMPLAINTS  F/u for continued management of metastatic lung adenocarcinoma  Diagnosis Metastatic Lung Adenocarcinoma with rt flank subcutaneous metastasis.  Current treatment:  Nivolumab q2 weeks Palliative RT to pain rt inguinal mass   PreviousTreatment -Concurrent Chemo-radiation with Carboplatin/Taxol. Completed RT on 02/13/2016 and has then completed 2 cycles of carboplatin + taxol. -Started Maintenance Durvalumab from  05/24/2016 q2weeks, switched to Nivolumab q2weeks on 12/13/16 once metastatic disease noted -s/p palliative RT to rt sided Subcutaneous metastatic mass.    HISTORY OF PRESENTING ILLNESS: Plz see my previous note for details on initial presentation.  INTERVAL HISTORY   Marilyn [name] presents to the office today for followup of her metastatic lung cancer. The patient's last visit with [place] was on 12/12/17. The pt reports that she is doing well overall.   The pt reports that the recent radiation treatment aided in her pain to her right groin. She recently started on Nystatin for her itching and a rash (vaginal, gluteal cleft, and between her thighs) that was prescribed by her PCP also she is taking Lyrica. She also uses cornstarch to her rash area. She wasn't able to take clindamycin due to not being covered by her insurance. She notes that she keeps the area as dry as possible. She has associated mild headaches. She denies decreased energy, new pain, vaginal discharge, leg swelling, mouth sores, abdominal pain, and any other symptoms.   Of note since the patient's last visit, pt has had MRI Brain and Cervical Spine completed on 12/22/17 with results revealing No metastatic disease identified in the head or cervical spine. Stable MRI appearance of the brain.  2. 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 No spinal cord signal abnormality or foraminal stenosis.  She completed radiation treatment since her last visit to the office with Radiation Oncologist, Dr. Tammi Klippel with radiation treatment dates of 11/30/2017-12/15/2017 to her right groin.   Lab results today (01/09/18) of CBC w/diff and CMP is as follows: CBC and CMP are PENDING at this time.      MEDICAL HISTORY:  Past Medical History:  Diagnosis Date  . Arthritis   . Asthma   . Diabetes mellitus    Type II  . Hemorrhoids   . Hypercholesteremia   . Hypertension   . Hypothyroidism   . Metastatic lung cancer (metastasis from lung to other site) (HCC)    Lung, Mets to skin- right flank. CT angio 2019 suspicious for axillary, external iliac, right inguinal mets  . 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 NICM (nonischemic cardiomyopathy) ([place])    a. mild - EF 45-50% by echo 10/2016 but EF 52% by low risk nuc 11/2016. b. Echo 07/2017 - EF normal, mild AI, unremarkable echo otherwise.  . Obese   . Pneumonia 2017  . Sinus tachycardia    a. dates back to at least 2013, etiology unclear.    SURGICAL HISTORY: Past Surgical History:  Procedure Laterality Date  . CESAREAN SECTION    . COLONOSCOPY    . I/D  arm Right 2013  . IR FLUORO GUIDE PORT INSERTION RIGHT  02/18/2017  . IR GENERIC HISTORICAL  01/08/2016  IR US GUIDE VASC ACCESS RIGHT 01/08/2016 [name], DO [facility]  . IR GENERIC HISTORICAL  01/08/2016   IR FLUORO GUIDE CV LINE RIGHT 01/08/2016 [name], DO [facility]  . IR US GUIDE VASC ACCESS RIGHT  02/18/2017  . MULTIPLE EXTRACTIONS WITH ALVEOLOPLASTY N/A 03/25/2017   Procedure: MULTIPLE EXTRACTION WITH ALVEOLOPLASTY (teeth #s four, six, seven, eight, nine, ten, eleven, one, twenty-three, twenty-four, twenty-five, twenty-six, twenty-nine, thirty);   Surgeon: [name], [name];  Location: [facility];  Service: Oral Surgery;  Laterality: N/A;  . TUBAL LIGATION    . US guided core needle biopsy     of right lower neck/supraclavicular lymph nodes    SOCIAL HISTORY: Social History   Socioeconomic History  . Marital status: Divorced    Spouse name: Not on file  . Number of children: Not on file  . Years of education: Not on file  . Highest education level: Not on file  Occupational History  . Not on file  Social Needs  . Financial resource strain: Not on file  . Food insecurity:    Worry: Not on file    Inability: Not on file  . Transportation needs:    Medical: Not on file    Non-medical: Not on file  Tobacco Use  . Smoking status: Never Smoker  . Smokeless tobacco: Never Used  Substance and Sexual Activity  . Alcohol use: No  . Drug use: No  . Sexual activity: Not Currently  Lifestyle  . Physical activity:    Days per week: Not on file    Minutes per session: Not on file  . Stress: Not on file  Relationships  . Social connections:    Talks on phone: Not on file    Gets together: Not on file    Attends religious service: Not on file    Active member of club or organization: Not on file    Attends meetings of clubs or organizations: Not on file    Relationship status: Not on file  . Intimate partner violence:    Fear of current or ex partner: Not on file    Emotionally abused: Not on file    Physically abused: Not on file    Forced sexual activity: Not on file  Other Topics Concern  . Not on file  Social History Narrative   No bird or mold exposure. No recent travel.   11-23-17 Unable to ask abuse questions mother with her today.    FAMILY HISTORY: Family History  Problem Relation Age of Onset  . Heart failure Mother   . Hypertension Mother   . Cancer Neg Hx   . Rheumatologic disease Neg Hx     ALLERGIES:  has No Known Allergies.  MEDICATIONS:  Current Outpatient Medications  Medication Sig Dispense  Refill  . aspirin EC 81 MG tablet Take 1 tablet (81 mg total) by mouth daily. 60 tablet 2  . clindamycin (CLINDAGEL) 1 % gel Apply topically 2 (two) times daily. To skin lesions in perineum -- folliculitis 30 g 0  . clotrimazole (CLOTRIMAZOLE ANTI-FUNGAL) 1 % cream Apply 1 application topically 2 (two) times daily. 30 g 0  . DULoxetine (CYMBALTA) 60 MG capsule TAKE 1 CAPSULE BY MOUTH EVERY DAY 30 capsule 2  . insulin aspart (NOVOLOG) 100 UNIT/ML FlexPen As per sliding scale instructions (Patient taking differently: Inject 0-10 Units into the skin 3 (three) times daily. As per sliding scale instructions) 15 mL 2  . Insulin Degludec (TRESIBA FLEXTOUCH)  200 UNIT/ML SOPN Inject 100 Units into the skin daily before breakfast.     . ketoconazole (NIZORAL) 2 % cream Apply to rash once daily. Keep area dry between applications. (Patient not taking: Reported on 11/23/2017) 60 g 0  . levothyroxine (SYNTHROID, LEVOTHROID) 150 MCG tablet Take 1 tablet (150 mcg total) by mouth daily before breakfast. 30 tablet 2  . levothyroxine (SYNTHROID, LEVOTHROID) 150 MCG tablet TAKE 1 TABLET BY MOUTH EVERY DAY BEFORE BREAKFAST 30 tablet 2  . lidocaine-prilocaine (EMLA) cream Apply 1 application topically as needed. 30 g 6  . loratadine (CLARITIN) 10 MG tablet Take 10 mg by mouth daily.    . methocarbamol (ROBAXIN) 500 MG tablet Take 1 tablet (500 mg total) by mouth every 8 (eight) hours as needed for muscle spasms. (Patient not taking: Reported on 11/23/2017) 8 tablet 0  . metoprolol succinate (TOPROL-XL) 50 MG 24 hr tablet Take 1 tablet (50 mg total) by mouth daily. Take with or immediately following a meal. 30 tablet 5  . miconazole (MICOTIN) 2 % powder Apply topically 2 (two) times daily. Apply to area of rash over the perineum (Patient not taking: Reported on 11/23/2017) 85 g 1  . naproxen (NAPROSYN) 500 MG tablet Take 1 tablet (500 mg total) by mouth 2 (two) times daily with a meal. 20 tablet 0  . omeprazole (PRILOSEC) 20  MG capsule Take 1 capsule (20 mg total) by mouth daily. (Patient not taking: Reported on 11/23/2017) 30 capsule 1  . oxyCODONE-acetaminophen (PERCOCET) 5-325 MG tablet Take 1 tablet by mouth every 6 (six) hours as needed for moderate pain or severe pain. 50 tablet 0  . polyethylene glycol (MIRALAX) packet Take 17 g by mouth daily. 30 each 1  . senna-docusate (SENNA S) 8.6-50 MG tablet Take 2 tablets by mouth 2 (two) times daily. May reduce to 2 tab po HS as needed once regular BM estabished (Patient taking differently: Take 2 tablets by mouth 2 (two) times daily as needed (FOR CONSTIPATION). =) 60 tablet 1  . traZODone (DESYREL) 50 MG tablet Take 1-2 tablets (50-100 mg total) by mouth at bedtime as needed for sleep. (Patient not taking: Reported on 11/23/2017) 60 tablet 0   No current facility-administered medications for this visit.    Facility-Administered Medications Ordered in Other Visits  Medication Dose Route Frequency Provider Last Rate Last Dose  . sodium chloride 0.9 % injection 10 mL  10 mL Intravenous PRN [name], MD   10 mL at 05/24/16 1252    REVIEW OF SYSTEMS:    A 10+ POINT REVIEW OF SYSTEMS WAS OBTAINED including neurology, dermatology, psychiatry, cardiac, respiratory, lymph, extremities, GI, GU, Musculoskeletal, constitutional, breasts, reproductive, HEENT.  All pertinent positives are noted in the HPI.  All others are negative.   PHYSICAL EXAMINATION: ECOG PERFORMANCE STATUS: 1 - Symptomatic but completely ambulatory  Vitals:   01/09/18 0922  BP: 128/77  Pulse: 94  Resp: 18  Temp: 98.3 F (36.8 C)  SpO2: 100%   Filed Weights   01/09/18 0922  Weight: 282 lb 9.6 oz (128.2 kg)   .Body mass index is 41.73 kg/m.  GENERAL:alert, in no acute distress and comfortable SKIN: no acute rashes, no significant lesions EYES: conjunctiva are pink and non-injected, sclera anicteric OROPHARYNX: MMM, no exudates, no oropharyngeal erythema or ulceration NECK:  supple, no JVD LYMPH:  no palpable lymphadenopathy in the cervical, axillary or inguinal regions LUNGS: clear to auscultation b/l with normal respiratory effort HEART: regular rate & rhythm  ABDOMEN:  normoactive bowel sounds , non tender, not distended. No palpable hepatosplenomegaly.  Extremity: no pedal edema GU: scaly rash in her perineum and intertriginous area between the thighs with satellite lesions suggesting fungal infection.   PSYCH: alert & oriented x 3 with fluent speech NEURO: no focal motor/sensory deficits   LABORATORY DATA: .  [name] CBC Latest Ref Rng & Units 01/09/2018 12/26/2017 12/12/2017  WBC 3.9 - 10.3 K/uL 4.0 4.6 4.2  Hemoglobin 11.6 - 15.9 g/dL 9.8(L) 9.8(L) 10.1(L)  Hematocrit 34.8 - 46.6 % 29.7(L) 30.5(L) 30.8(L)  Platelets 145 - 400 K/uL 274 210 253    . CMP Latest Ref Rng & Units 12/26/2017 12/12/2017 11/28/2017  Glucose 70 - 99 mg/dL 259(H) 162(H) 346(H)  BUN 6 - 20 mg/dL _0 Creatinine 0.44 - 1.00 mg/dL 0.78 0.71 0.85  Sodium 135 - 145 mmol/L 139 144 136  Potassium 3.5 - 5.1 mmol/L 3.9 4.0 3.9  Chloride 98 - 111 mmol/L 104 105 102  CO2 22 - 32 mmol/L _1 Calcium 8.9 - 10.3 mg/dL 8.6(L) 8.9 8.6(L)  Total Protein 6.5 - 8.1 g/dL 7.3 7.3 7.3  Total Bilirubin 0.3 - 1.2 mg/dL <0.2(L) 0.3 <0.2(L)  Alkaline Phos 38 - 126 U/L 92 94 95  AST 15 - 41 U/L 12(L) 12(L) 11(L)  ALT 0 - 44 U/L _2 08/18/17 Bx:   08/16/17 Molecular Pathology:    RADIOGRAPHIC STUDIES:  .Mr Jeri Cos Wo Contrast  Result Date: 12/22/2017 CLINICAL DATA:  52 year old female with metastatic non-small cell lung cancer. Occipital and neck pain. EXAM: MRI HEAD WITHOUT AND WITH CONTRAST MRI CERVICAL SPINE WITHOUT AND WITH CONTRAST TECHNIQUE: Multiplanar, multiecho pulse sequences of the brain and surrounding structures, and cervical spine, to include the craniocervical junction and cervicothoracic junction, were obtained without and with intravenous contrast.  CONTRAST:  10 milliliters Gadavist COMPARISON:  PET-CT 08/03/2017.  Brain MRI 11/19/2016, and earlier. FINDINGS: MRI HEAD FINDINGS Brain: No abnormal enhancement identified. No midline shift, mass effect, or evidence of intracranial mass lesion. No dural thickening. Normal cerebral volume. No restricted diffusion to suggest acute infarction. No ventriculomegaly, extra-axial collection or acute intracranial hemorrhage. Cervicomedullary junction and pituitary are within normal limits. Scattered small cerebral white matter T2 and FLAIR hyperintense foci in both hemispheres are redemonstrated and mostly subcortical. These are stable since 2018 and nonspecific. No cortical encephalomalacia or chronic cerebral blood products identified. The deep gray matter nuclei, brainstem, and cerebellum remain normal. Vascular: Major intracranial vascular flow voids are stable since 2018. The vertebrobasilar system is diminutive probably due to fetal type PCA origins. The major dural venous sinuses are enhancing and appear patent. Skull and upper cervical spine: Cervical spine findings are below. Calvarium bone marrow signal, including in the occiput and at the skull base is stable since 2017 and within normal limits. Sinuses/Orbits: Normal orbits soft tissues. Trace paranasal sinus mucosal thickening today. Other: Mastoid air cells remain clear. Visible internal auditory structures appear normal. Normal stylomastoid foramina. Scalp and face soft tissues appear within normal limits. MRI CERVICAL SPINE FINDINGS Alignment: Normal vertebral height and alignment. Vertebrae: Visualized bone marrow signal is within normal limits; increased intrinsic T1 marrow signal in the visible upper thoracic levels probably reflects prior radiation therapy. Visible skull base also appears within normal limits. No marrow edema or evidence of acute osseous abnormality. No abnormal enhancement identified. Cord: Spinal cord signal is within  normal limits at  all visualized levels. No abnormal intradural enhancement. No dural thickening. Posterior Fossa, vertebral arteries, paraspinal tissues: Preserved major vascular flow voids in the neck. The left vertebral artery is dominant. Negative neck soft tissues. No neck mass or lymphadenopathy. Disc levels: Generally mild for age cervical spine degeneration. At C4-C5 there is disc bulging, facet and ligament flavum hypertrophy with borderline to mild spinal stenosis. At C5-C6 there is a small to moderate size central disc protrusion (series 6, image 17) superimposed on mild disc bulge and ligament flavum hypertrophy resulting in spinal stenosis with up to mild spinal cord mass effect. No cord signal abnormality. No foraminal stenosis. At C6-C7 there is left eccentric disc bulging with mild endplate spurring, facet and ligament flavum hypertrophy. Mild spinal stenosis and left C7 foraminal stenosis. No cord mass effect. IMPRESSION: 1. No metastatic disease identified in the head or cervical spine. Stable MRI appearance of the brain. 2. 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 No spinal cord signal abnormality or foraminal stenosis. Electronically Signed   By: Genevie Ann M.D.   On: 12/22/2017 10:25   Mr Cervical Spine W Wo Contrast  Result Date: 12/22/2017 CLINICAL DATA:  52 year old female with metastatic non-small cell lung cancer. Occipital and neck pain. EXAM: MRI HEAD WITHOUT AND WITH CONTRAST MRI CERVICAL SPINE WITHOUT AND WITH CONTRAST TECHNIQUE: Multiplanar, multiecho pulse sequences of the brain and surrounding structures, and cervical spine, to include the craniocervical junction and cervicothoracic junction, were obtained without and with intravenous contrast. CONTRAST:  10 milliliters Gadavist COMPARISON:  PET-CT 08/03/2017.  Brain MRI 11/19/2016, and earlier. FINDINGS: MRI HEAD FINDINGS Brain: No abnormal  enhancement identified. No midline shift, mass effect, or evidence of intracranial mass lesion. No dural thickening. Normal cerebral volume. No restricted diffusion to suggest acute infarction. No ventriculomegaly, extra-axial collection or acute intracranial hemorrhage. Cervicomedullary junction and pituitary are within normal limits. Scattered small cerebral white matter T2 and FLAIR hyperintense foci in both hemispheres are redemonstrated and mostly subcortical. These are stable since 2018 and nonspecific. No cortical encephalomalacia or chronic cerebral blood products identified. The deep gray matter nuclei, brainstem, and cerebellum remain normal. Vascular: Major intracranial vascular flow voids are stable since 2018. The vertebrobasilar system is diminutive probably due to fetal type PCA origins. The major dural venous sinuses are enhancing and appear patent. Skull and upper cervical spine: Cervical spine findings are below. Calvarium bone marrow signal, including in the occiput and at the skull base is stable since 2017 and within normal limits. Sinuses/Orbits: Normal orbits soft tissues. Trace paranasal sinus mucosal thickening today. Other: Mastoid air cells remain clear. Visible internal auditory structures appear normal. Normal stylomastoid foramina. Scalp and face soft tissues appear within normal limits. MRI CERVICAL SPINE FINDINGS Alignment: Normal vertebral height and alignment. Vertebrae: Visualized bone marrow signal is within normal limits; increased intrinsic T1 marrow signal in the visible upper thoracic levels probably reflects prior radiation therapy. Visible skull base also appears within normal limits. No marrow edema or evidence of acute osseous abnormality. No abnormal enhancement identified. Cord: Spinal cord signal is within normal limits at all visualized levels. No abnormal intradural enhancement. No dural thickening. Posterior Fossa, vertebral arteries, paraspinal tissues: Preserved  major vascular flow voids in the neck. The left vertebral artery is dominant. Negative neck soft tissues. No neck mass or lymphadenopathy. Disc levels: Generally mild for age cervical spine degeneration. At C4-C5 there is  disc bulging, facet and ligament flavum hypertrophy with borderline to mild spinal stenosis. At C5-C6 there is a small to moderate size central disc protrusion (series 6, image 17) superimposed on mild disc bulge and ligament flavum hypertrophy resulting in spinal stenosis with up to mild spinal cord mass effect. No cord signal abnormality. No foraminal stenosis. At C6-C7 there is left eccentric disc bulging with mild endplate spurring, facet and ligament flavum hypertrophy. Mild spinal stenosis and left C7 foraminal stenosis. No cord mass effect. IMPRESSION: 1. No metastatic disease identified in the head or cervical spine. Stable MRI appearance of the brain. 2. 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 No spinal cord signal abnormality or foraminal stenosis. Electronically Signed   By: Genevie Ann M.D.   On: 12/22/2017 10:25     ASSESSMENT & PLAN:   52 y.o.  African-American female with history of hypertension, diabetes, asthma, dysuria mass and morbid obesity with    #1 h/o Lung Adenocarcinoma Rt sided at least Stage IIIB (on diagnosis) with large right paratracheal mass with mediastinal adenopathy that appears to have grown significantly over the last 6-7 months and rt supraclavicular LN +.  -Noted to have a small mass in the left adrenal on CT but PET/CT neg for metastatic disease. Patient has been a lifelong nonsmoker -MRI of the brain was negative for any metastatic disease. At diagnosis. -High PDL1 expression (90%) on [facility] Neg for EGFR, ALK, ROS-1 and BRAF mutations. -Patient completed her planned definitive chemoradiation with Carbo Taxol on 02/13/2016. No  prohibitive toxicities other than some grade 1 skin desquamation and some grade 1-2 radiation esophagitis. She has subsequently received 2 out of 2 planned dose of carboplatin + Taxol. -CTA chest 10/29/2016  no evidence of lung cancer progression in the chest. No PE -CT abd/pelvis-10/29/2016  Interval 5.0 x 5.0 x 4.7 cm mass in the right lateral subcutaneous fat at the level of the mid abdomen. This has CT features compatible with a metastasis or primary neoplasm. -CT C/A/P (05/12/2017): Interval development of right axillary, right external iliac and right inguinal adenopathy. Suspicious for nodal metastasis. 2. Decrease in size of right paratracheal adenopathy. 3. Decrease in size and enhancement associated with right lateral body wall lesion. 4. New small nonspecific pulmonary nodule in the left lower lobe measuring 6 mm. 5. Stable appearance of changes secondary to external beam radiation within the right lung  08/03/17 PET which revealed 2.8 cm right axillary lymph node is markedly hypermetabolic and consistent with metastatic disease. Extensive radiation changes involving the right paramediastinal lung. There is a focus of hypermetabolism in the right lower lobe which is suspicious for residual or recurrent tumor.  11/11/17 CT C/A/P revealed There has been interval increase in number of enlarged of right axillary nodes and increase in size of right inguinal adenopathy compatible with progression of disease. Stable appearance of the right lung with changes of external beam radiation.    #2 Now with Metastatic lung adenocarcinoma with isolated metastases in the right lateral subcutaneous fat at the level of the midabdomen. she received maintenance Durvalumab from  05/24/2016 q2weeks, switched to Nivolumab q2weeks on 12/13/16 in the setting of Biopsy proven isolated metastatic disease. Given the tumor is strongly PDL1 positive and patient has only isolated metastatic disease was treated with palliative RT to  metastases with significant improvement. Molecular pathology from 515/19 did not reveal obviously targetable mutations with NF1 and  TP53 mutations present.  Plan:  -No meaningful progression of symptoms or solid organ involvement  -The pt has no prohibitive toxicities from continuing Nivolumab at this time.   -Advised that pt continue follow up with her cardiologist -Advised patient to follow up with her PCP regarding her Lyrica dose.  -Continue Nivolumab q2weeks with labs- please schedule next 4 treatments -RTC with Dr Irene Limbo in 4 weeks  #3 h/o radiation pneumonitis - no new symptoms. Does has some cough with exertion but this has improved.  #4 Improving fatigue (grade 1) - improved fatigue with levothyroxine compliance with near normalization of FT4 levels.  - previously given TSH of 12 -- increased Levolthyroxine from 197mg to 1568m  ---will likely need larger dose but in the setting of baseline tachycardia we are gradually increasing dose to avoid ppt cardiac arrhythmias.  -TSH 11/14/2017 WNL.  #5 normocytic anemia related to chemotherapy -resolving and stable  #6 Left chest wall pain due to costochondritis - mx with pain medications. CTA x 3 neg for PE. No significant pain at this time.  #7 Sinus tachycardia - on BB per cards , no PE on CTA x 3 , -Still having sinus tachycardia . Partly from deconditioning and anemia. Seeing Dr. HiDebara Pickettrom cardiology  -Continues to be on BB  #8 neoplasm related pain is currently controlled without significant medications. Clinically likely has sleep apnea . Would need to be careful with sedative medications . 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 The patient knows to call the clinic with any problems, questions or concerns.  The total time spent in the appt was 25 minutes and more than 50% was on counseling and direct patient cares.     GautSullivan LoneMS AAHIVMS [facility]/Oncology Physician [facility]):       [phone] cell):  [phone]):           [phone] Soijett Blue am acting as scribe for Dr. GautSullivan LoneI have reviewed the above documentation for accuracy and completeness, and I agree with the above. .GauBrunetta Genera

## 2018-01-09 ENCOUNTER — Inpatient Hospital Stay: Payer: Medicaid Other | Attending: Hematology

## 2018-01-09 ENCOUNTER — Inpatient Hospital Stay: Payer: Medicaid Other

## 2018-01-09 ENCOUNTER — Inpatient Hospital Stay (HOSPITAL_BASED_OUTPATIENT_CLINIC_OR_DEPARTMENT_OTHER): Payer: Medicaid Other | Admitting: Hematology

## 2018-01-09 ENCOUNTER — Encounter: Payer: Self-pay | Admitting: Hematology

## 2018-01-09 ENCOUNTER — Ambulatory Visit: Payer: Medicaid Other

## 2018-01-09 VITALS — BP 128/77 | HR 94 | Temp 98.3°F | Resp 18 | Ht 69.0 in | Wt 282.6 lb

## 2018-01-09 DIAGNOSIS — Z923 Personal history of irradiation: Secondary | ICD-10-CM | POA: Diagnosis not present

## 2018-01-09 DIAGNOSIS — M940 Chondrocostal junction syndrome [Tietze]: Secondary | ICD-10-CM | POA: Insufficient documentation

## 2018-01-09 DIAGNOSIS — D6481 Anemia due to antineoplastic chemotherapy: Secondary | ICD-10-CM | POA: Diagnosis not present

## 2018-01-09 DIAGNOSIS — Z5112 Encounter for antineoplastic immunotherapy: Secondary | ICD-10-CM | POA: Insufficient documentation

## 2018-01-09 DIAGNOSIS — E7800 Pure hypercholesterolemia, unspecified: Secondary | ICD-10-CM

## 2018-01-09 DIAGNOSIS — Z452 Encounter for adjustment and management of vascular access device: Secondary | ICD-10-CM

## 2018-01-09 DIAGNOSIS — J45909 Unspecified asthma, uncomplicated: Secondary | ICD-10-CM | POA: Insufficient documentation

## 2018-01-09 DIAGNOSIS — C792 Secondary malignant neoplasm of skin: Secondary | ICD-10-CM | POA: Insufficient documentation

## 2018-01-09 DIAGNOSIS — Z9221 Personal history of antineoplastic chemotherapy: Secondary | ICD-10-CM | POA: Diagnosis not present

## 2018-01-09 DIAGNOSIS — G629 Polyneuropathy, unspecified: Secondary | ICD-10-CM | POA: Insufficient documentation

## 2018-01-09 DIAGNOSIS — M4802 Spinal stenosis, cervical region: Secondary | ICD-10-CM | POA: Insufficient documentation

## 2018-01-09 DIAGNOSIS — Z79899 Other long term (current) drug therapy: Secondary | ICD-10-CM | POA: Diagnosis not present

## 2018-01-09 DIAGNOSIS — I10 Essential (primary) hypertension: Secondary | ICD-10-CM | POA: Diagnosis not present

## 2018-01-09 DIAGNOSIS — E039 Hypothyroidism, unspecified: Secondary | ICD-10-CM | POA: Diagnosis not present

## 2018-01-09 DIAGNOSIS — E1140 Type 2 diabetes mellitus with diabetic neuropathy, unspecified: Secondary | ICD-10-CM | POA: Diagnosis not present

## 2018-01-09 DIAGNOSIS — Z8701 Personal history of pneumonia (recurrent): Secondary | ICD-10-CM | POA: Diagnosis not present

## 2018-01-09 DIAGNOSIS — Z7982 Long term (current) use of aspirin: Secondary | ICD-10-CM | POA: Diagnosis not present

## 2018-01-09 DIAGNOSIS — G893 Neoplasm related pain (acute) (chronic): Secondary | ICD-10-CM

## 2018-01-09 DIAGNOSIS — E119 Type 2 diabetes mellitus without complications: Secondary | ICD-10-CM

## 2018-01-09 DIAGNOSIS — R000 Tachycardia, unspecified: Secondary | ICD-10-CM | POA: Diagnosis not present

## 2018-01-09 DIAGNOSIS — G4700 Insomnia, unspecified: Secondary | ICD-10-CM | POA: Insufficient documentation

## 2018-01-09 DIAGNOSIS — Z95828 Presence of other vascular implants and grafts: Secondary | ICD-10-CM

## 2018-01-09 DIAGNOSIS — C3491 Malignant neoplasm of unspecified part of right bronchus or lung: Secondary | ICD-10-CM

## 2018-01-09 DIAGNOSIS — Z794 Long term (current) use of insulin: Secondary | ICD-10-CM | POA: Insufficient documentation

## 2018-01-09 DIAGNOSIS — Z7189 Other specified counseling: Secondary | ICD-10-CM

## 2018-01-09 DIAGNOSIS — M129 Arthropathy, unspecified: Secondary | ICD-10-CM

## 2018-01-09 LAB — CMP (CANCER CENTER ONLY)
ALT: 8 U/L (ref 0–44)
AST: 13 U/L — AB (ref 15–41)
Albumin: 3 g/dL — ABNORMAL LOW (ref 3.5–5.0)
Alkaline Phosphatase: 87 U/L (ref 38–126)
Anion gap: 8 (ref 5–15)
BUN: 7 mg/dL (ref 6–20)
CHLORIDE: 106 mmol/L (ref 98–111)
CO2: 30 mmol/L (ref 22–32)
Calcium: 8.7 mg/dL — ABNORMAL LOW (ref 8.9–10.3)
Creatinine: 0.75 mg/dL (ref 0.44–1.00)
GFR, Est AFR Am: >60 mL/min (ref >60)
GFR, Estimated: >60 mL/min (ref >60)
GLUCOSE: 135 mg/dL — AB (ref 70–99)
POTASSIUM: 3.9 mmol/L (ref 3.5–5.1)
Sodium: 144 mmol/L (ref 135–145)
Total Bilirubin: <0.2 mg/dL — ABNORMAL LOW (ref 0.3–1.2)
Total Protein: 7.3 g/dL (ref 6.5–8.1)

## 2018-01-09 LAB — CBC WITH DIFFERENTIAL/PLATELET
BASOS ABS: 0 10*3/uL (ref 0.0–0.1)
Basophils Relative: 1 %
EOS PCT: 7 %
Eosinophils Absolute: 0.3 10*3/uL (ref 0.0–0.5)
HEMATOCRIT: 29.7 % — AB (ref 34.8–46.6)
Hemoglobin: 9.8 g/dL — ABNORMAL LOW (ref 11.6–15.9)
Lymphocytes Relative: 18 %
Lymphs Abs: 0.7 10*3/uL — ABNORMAL LOW (ref 0.9–3.3)
MCH: 29 pg (ref 25.1–34.0)
MCHC: 33.1 g/dL (ref 31.5–36.0)
MCV: 87.6 fL (ref 79.5–101.0)
MONO ABS: 0.2 10*3/uL (ref 0.1–0.9)
Monocytes Relative: 5 %
NEUTROS ABS: 2.8 10*3/uL (ref 1.5–6.5)
Neutrophils Relative %: 69 %
PLATELETS: 274 10*3/uL (ref 145–400)
RBC: 3.39 MIL/uL — ABNORMAL LOW (ref 3.70–5.45)
RDW: 14.2 % (ref 11.2–14.5)
WBC: 4 10*3/uL (ref 3.9–10.3)

## 2018-01-09 MED ORDER — SODIUM CHLORIDE 0.9 % IV SOLN
240.0000 mg | Freq: Once | INTRAVENOUS | Status: AC
  Administered 2018-01-09: 240 mg via INTRAVENOUS
  Filled 2018-01-09: qty 24

## 2018-01-09 MED ORDER — SODIUM CHLORIDE 0.9% FLUSH
10.0000 mL | INTRAVENOUS | Status: DC | PRN
  Administered 2018-01-09: 10 mL
  Filled 2018-01-09: qty 10

## 2018-01-09 MED ORDER — HEPARIN SOD (PORK) LOCK FLUSH 100 UNIT/ML IV SOLN
500.0000 [IU] | Freq: Once | INTRAVENOUS | Status: AC | PRN
  Administered 2018-01-09: 500 [IU]
  Filled 2018-01-09: qty 5

## 2018-01-09 MED ORDER — SODIUM CHLORIDE 0.9 % IJ SOLN
10.0000 mL | INTRAMUSCULAR | Status: DC | PRN
  Administered 2018-01-09: 10 mL via INTRAVENOUS
  Filled 2018-01-09: qty 10

## 2018-01-09 MED ORDER — SODIUM CHLORIDE 0.9 % IV SOLN
Freq: Once | INTRAVENOUS | Status: AC
  Administered 2018-01-09: 10:00:00 via INTRAVENOUS
  Filled 2018-01-09: qty 250

## 2018-01-09 NOTE — Patient Instructions (Signed)
[facility] Discharge Instructions for Patients Receiving Chemotherapy  Today you received the following chemotherapy agents Opdivo  To help prevent nausea and vomiting after your treatment, we encourage you to take your nausea medication as directed   If you develop nausea and vomiting that is not controlled by your nausea medication, call the clinic.   BELOW ARE SYMPTOMS THAT SHOULD BE REPORTED IMMEDIATELY:  *FEVER GREATER THAN 100.5 F  *CHILLS WITH OR WITHOUT FEVER  NAUSEA AND VOMITING THAT IS NOT CONTROLLED WITH YOUR NAUSEA MEDICATION  *UNUSUAL SHORTNESS OF BREATH  *UNUSUAL BRUISING OR BLEEDING  TENDERNESS IN MOUTH AND THROAT WITH OR WITHOUT PRESENCE OF ULCERS  *URINARY PROBLEMS  *BOWEL PROBLEMS  UNUSUAL RASH Items with * indicate a potential emergency and should be followed up as soon as possible.  Feel free to call the clinic should you have any questions or concerns. The clinic phone number is [phone].  Please show the CHEMO ALERT CARD at check-in to the Emergency Department and triage nurse.   

## 2018-01-16 IMAGING — XA IR US GUIDE VASC ACCESS RIGHT
1 series · 1 of 1 positions shown · non-contrast
Comparison: none

CLINICAL DATA: Lung cancer, needs durable venous access for
chemotherapy regimen.
TECHNIQUE: The procedure, risks, benefits, and alternatives were explained to
the patient. Questions regarding the procedure were encouraged and
answered. The patient understands and consents to the procedure.

[Series 300: line placements · 1 of 1 slices shown]
[im 1/1]
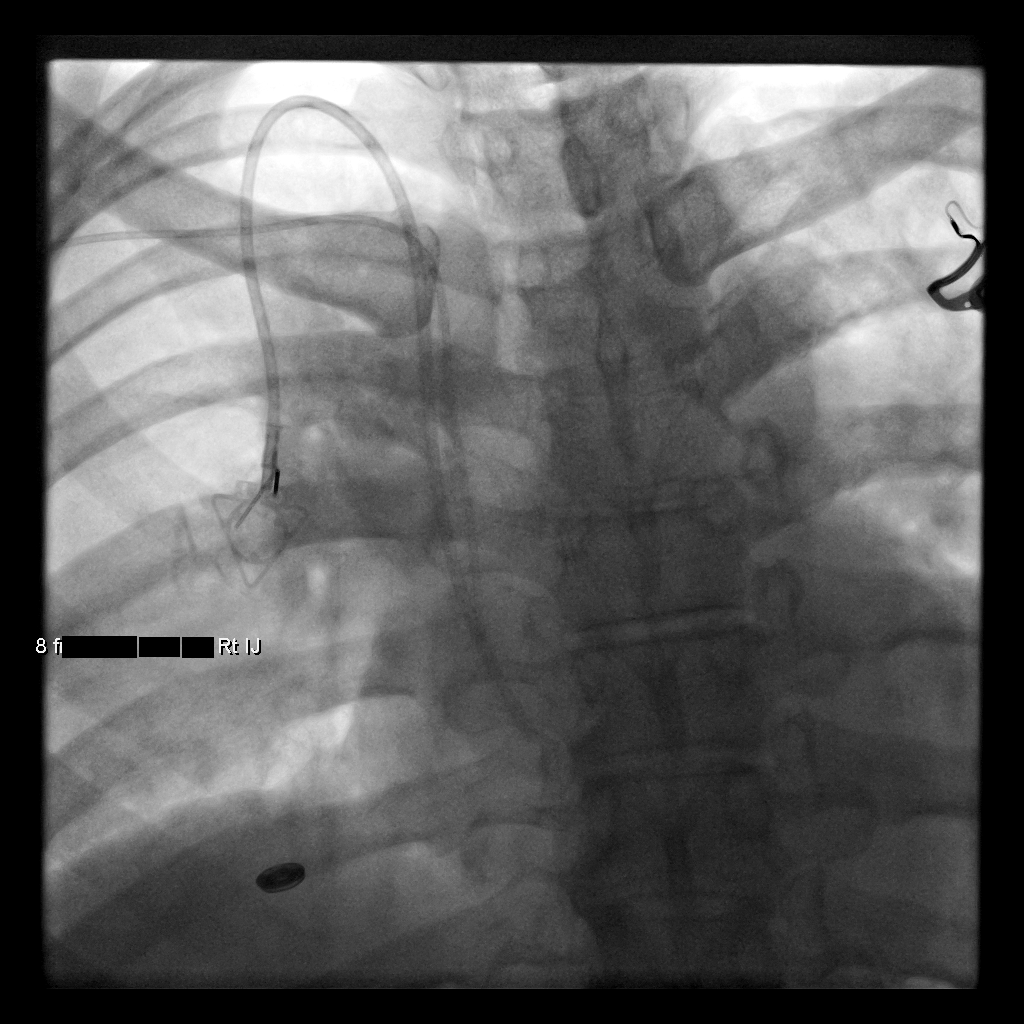

[1 of 1 positions shown; findings below may reference images not displayed]

EXAM:
TUNNELED PORT CATHETER PLACEMENT WITH ULTRASOUND AND FLUOROSCOPIC
GUIDANCE

FLUOROSCOPY TIME:  0.1 minute, 48 uQym6 DAP

ANESTHESIA/SEDATION:
Intravenous Fentanyl and Versed were administered as conscious
sedation during continuous monitoring of the patient's level of
consciousness and physiological / cardiorespiratory status by the
radiology RN, with a total moderate sedation time of 15 minutes.
As antibiotic prophylaxis, cefazolin 2 g was ordered pre-procedure
and administered intravenously within one hour of incision. Patency
of the right IJ vein was confirmed with ultrasound with image
documentation. An appropriate skin site was determined. Skin site
was marked. Region was prepped using maximum barrier technique
including cap and mask, sterile gown, sterile gloves, large sterile
sheet, and Chlorhexidine as cutaneous antisepsis. The region was
infiltrated locally with 1% lidocaine. Under real-time ultrasound
guidance, the right IJ vein was accessed with a 21 gauge
micropuncture needle; the needle tip within the vein was confirmed
with ultrasound image documentation. Needle was exchanged over a 018
guidewire for transitional dilator which allowed passage of the
Benson wire into the IVC. Over this, the transitional dilator was
exchanged for a 5 French MPA catheter. A small incision was made on
the right anterior chest wall and a subcutaneous pocket fashioned.
The power-injectable port was positioned and its catheter tunneled
to the right IJ dermatotomy site. The MPA catheter was exchanged
over an Amplatz wire for a peel-away sheath, through which the port
catheter, which had been trimmed to the appropriate length, was
advanced and positioned under fluoroscopy with its tip at the
cavoatrial junction. Spot chest radiograph confirms good catheter
position and no pneumothorax. The pocket was closed with deep
interrupted and subcuticular continuous 3-0 Monocryl sutures. The
port was flushed per protocol. The incisions were covered with
Dermabond then covered with a sterile dressing.

COMPLICATIONS:
COMPLICATIONS
None immediate
IMPRESSION: Technically successful right IJ power-injectable port catheter
placement. Ready for routine use.

## 2018-01-17 ENCOUNTER — Ambulatory Visit (INDEPENDENT_AMBULATORY_CARE_PROVIDER_SITE_OTHER): Payer: Medicaid Other | Admitting: Internal Medicine

## 2018-01-17 ENCOUNTER — Encounter: Payer: Self-pay | Admitting: Internal Medicine

## 2018-01-17 VITALS — BP 120/80 | HR 124 | Ht 69.0 in | Wt 278.0 lb

## 2018-01-17 DIAGNOSIS — I428 Other cardiomyopathies: Secondary | ICD-10-CM | POA: Diagnosis not present

## 2018-01-17 DIAGNOSIS — R000 Tachycardia, unspecified: Secondary | ICD-10-CM

## 2018-01-17 DIAGNOSIS — I10 Essential (primary) hypertension: Secondary | ICD-10-CM

## 2018-01-17 MED ORDER — METOPROLOL SUCCINATE ER 50 MG PO TB24: ORAL_TABLET | ORAL | 3 refills | Status: DC

## 2018-01-17 NOTE — Progress Notes (Signed)
OFFICE CONSULT NOTE  Chief Complaint:  Tachycardia  Primary Care Physician: [name], FNP  HPI:  Marilyn [name] is a 52 y.o. female who is being seen today for the evaluation of tachycardia at the request of [name], FNP. Marilyn [name] is a pleasant 52 yo female with a history of diagnosis of lung cancer in September 2017. At that time she underwent chemotherapy and radiation. She has since been deemed cancer free. She was noted to be tachycardic at the time and an echocardiogram was performed. This demonstrated moderate LVH, indeterminate diastolic function EF 82-50%. No significant valvular heart disease and no pericardial effusion was noted. Since then she's been persistently tachycardic with heart rates in the 100-120 range. She denies any worsening shortness of breath or chest pain. She is moderately obese. Blood pressure today is low 96/70. She was previously on metoprolol but that was discontinued as it worsened her asthma. She is now on diltiazem 180 mg for rate control. She has a number of prevascular risk factors including type 2 diabetes, obesity, hypercholesterolemia, hypertension and family history of heart failure in her mother. She is not a smoker. She also has recently diagnosed hypothyroidism with laboratory work that indicated a markedly elevated TSH of 43.4 and a low T4 of 3. Hemoglobin A1c was 9.1.  12/01/2016  Marilyn [name] returns today for follow-up. She underwent an echocardiogram and nuclear stress test. Her echo shows a reduction in LV systolic function to 03-70%, from her previous echo in September. A nuclear stress test performed which shows no ischemia. I suspect that the degree of cardiomyopathy is responsible for her tachycardia. She is being treated for hypothyroidism and has had an elevated TSH and low T4. Her oncologist is been increasing her levothyroxine which is now 125 g daily. Free T4 remains suppressed at 0.19. As well as T3 uptake and TSH  is still elevated at 42. Recently she been on metoprolol, but says that she thinks it worsened her asthma. Interestingly she's not on any asthma medications and had a childhood asthma. She reported the worsening of her "asthma" was somewhat of a nonproductive cough and shortness of breath when walking up stairs. Not certain that this is the medicine. We discussed treatment of cardiomyopathy today and the fact that she is also diabetic and would benefit from both a beta blocker and ACE inhibitor or ARB.  10/04/2016  Marilyn [name] returns today for follow-up.  Overall she seems to feel well.  Thyroid function is not completely optimized yet and is managed by the oncologist.  She denies any chest pain or worsening shortness of breath.  Heart rate is elevated today however she did not take her carvedilol.  Blood pressure is normal.  Her last LVEF was 45-50% earlier this year.  She reports her diabetes is up and down.  01/17/2018  Marilyn [name] is seen today in follow-up.  She continues to experience tachycardia.  Rarely does her heart rate go below 100.  When she was last seen by Beckie Busing, DNP, she increased her Toprol-XL to 50 mg daily.  She is tolerated the medication with normal blood pressure 120/80.  Despite this, heart rate still remains elevated at times.  Recently she had an increase in her thyroid medication, but does not seem to note a difference in heart rate with this although she feels less fatigued.  PMHx:  Past Medical History:  Diagnosis Date  . Arthritis   . Asthma   . Diabetes mellitus  Type II  . Hemorrhoids   . Hypercholesteremia   . Hypertension   . Hypothyroidism   . Metastatic lung cancer (metastasis from lung to other site) (HCC)    Lung, Mets to skin- right flank. CT angio 2019 suspicious for axillary, external iliac, right inguinal mets  . 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 NICM (nonischemic cardiomyopathy) ([place])    a. mild - EF 45-50% by  echo 10/2016 but EF 52% by low risk nuc 11/2016. b. Echo 07/2017 - EF normal, mild AI, unremarkable echo otherwise.  . Obese   . Pneumonia 2017  . Sinus tachycardia    a. dates back to at least 2013, etiology unclear.    Past Surgical History:  Procedure Laterality Date  . CESAREAN SECTION    . COLONOSCOPY    . I/D  arm Right 2013  . IR FLUORO GUIDE PORT INSERTION RIGHT  02/18/2017  . IR GENERIC HISTORICAL  01/08/2016   IR US GUIDE VASC ACCESS RIGHT 01/08/2016 [name], DO [facility]-INTERV RAD  . IR GENERIC HISTORICAL  01/08/2016   IR FLUORO GUIDE CV LINE RIGHT 01/08/2016 [name], DO [facility]INTER[facility]  . IR US GUIDE VASC ACCESS RIGHT  02/18/2017  . MULTIPLE EXTRACTIONS WITH ALVEOLOPLASTY N/A 03/25/2017   Procedure: MULTIPLE EXTRACTION WITH ALVEOLOPLASTY (teeth #s four, six, seven, eight, nine, ten, eleven, one, twenty-three, twenty-four, twenty-five, twenty-six, twenty-nine, thirty);  Surgeon: [name], [name];  Location: [place];  Service: Oral Surgery;  Laterality: N/A;  . TUBAL LIGATION    . US guided core needle biopsy     of right lower neck/supraclavicular lymph nodes    FAMHx:  Family History  Problem Relation Age of Onset  . Heart failure Mother   . Hypertension Mother   . Cancer Neg Hx   . Rheumatologic disease Neg Hx     SOCHx:   reports that she has never smoked. She has never used smokeless tobacco. She reports that she does not drink alcohol or use drugs.  ALLERGIES:  No Known Allergies  ROS: Pertinent items noted in HPI and remainder of comprehensive ROS otherwise negative.  HOME MEDS: Current Outpatient Medications on File Prior to Visit  Medication Sig Dispense Refill  . aspirin EC 81 MG tablet Take 1 tablet (81 mg total) by mouth daily. 60 tablet 2  . DULoxetine (CYMBALTA) 60 MG capsule TAKE 1 CAPSULE BY MOUTH EVERY DAY 30 capsule 2  . insulin aspart (NOVOLOG) 100 UNIT/ML FlexPen As per sliding scale instructions (Patient taking differently: Inject 0-10 Units  into the skin 3 (three) times daily. As per sliding scale instructions) 15 mL 2  . Insulin Degludec (TRESIBA FLEXTOUCH) 200 UNIT/ML SOPN Inject 100 Units into the skin daily before breakfast.     . levothyroxine (SYNTHROID, LEVOTHROID) 150 MCG tablet Take 1 tablet (150 mcg total) by mouth daily before breakfast. 30 tablet 2  . lidocaine-prilocaine (EMLA) cream Apply 1 application topically as needed. 30 g 6  . metoprolol succinate (TOPROL-XL) 50 MG 24 hr tablet Take 1 tablet (50 mg total) by mouth daily. Take with or immediately following a meal. 30 tablet 5  . nystatin cream (MYCOSTATIN) Apply 1 application topically 2 (two) times daily.    [name] oxyCODONE-acetaminophen (PERCOCET) 5-325 MG tablet Take 1 tablet by mouth every 6 (six) hours as needed for moderate pain or severe pain. 50 tablet 0  . pregabalin (LYRICA) 50 MG capsule Take 50 mg by mouth 3 (three) times  daily.    . senna-docusate (SENNA S) 8.6-50 MG tablet Take 2 tablets by mouth 2 (two) times daily. May reduce to 2 tab po HS as needed once regular BM estabished (Patient taking differently: Take 2 tablets by mouth 2 (two) times daily as needed (FOR CONSTIPATION). =) 60 tablet 1  . naproxen (NAPROSYN) 500 MG tablet Take 1 tablet (500 mg total) by mouth 2 (two) times daily with a meal. (Patient not taking: Reported on 01/17/2018) 20 tablet 0  . traZODone (DESYREL) 50 MG tablet Take 1-2 tablets (50-100 mg total) by mouth at bedtime as needed for sleep. (Patient not taking: Reported on 11/23/2017) 60 tablet 0  . [DISCONTINUED] prochlorperazine (COMPAZINE) 10 MG tablet Take 1 tablet (10 mg total) by mouth every 6 (six) hours as needed (Nausea or vomiting). 30 tablet 1   Current Facility-Administered Medications on File Prior to Visit  Medication Dose Route Frequency Provider Last Rate Last Dose  . sodium chloride 0.9 % injection 10 mL  10 mL Intravenous PRN [name], MD   10 mL at 05/24/16 1252    LABS/IMAGING: No results found for  this or any previous visit (from the past 48 hour(s)). No results found.  LIPID PANEL: No results found for: CHOL, TRIG, HDL, CHOLHDL, VLDL, LDLCALC, LDLDIRECT  WEIGHTS: Wt Readings from Last 3 Encounters:  01/17/18 278 lb (126.1 kg)  01/09/18 282 lb 9.6 oz (128.2 kg)  12/12/17 276 lb 6.4 oz (125.4 kg)    VITALS: BP 120/80   Pulse (!) 124   Ht 5\' 9"  (1.753 m)   Wt 278 lb (126.1 kg)   LMP 01/21/2016 Comment: pt signed preg test waiver 04/16/16   BMI 41.05 kg/m   EXAM: General appearance: alert, no distress and moderately obese Neck: no carotid bruit, no JVD and thyroid not enlarged, symmetric, no tenderness/mass/nodules Lungs: clear to auscultation bilaterally Heart: regular rate and rhythm, S1, S2 normal, no murmur, click, rub or gallop Abdomen: soft, non-tender; bowel sounds normal; no masses,  no organomegaly Extremities: extremities normal, atraumatic, no cyanosis or edema Pulses: 2+ and symmetric Skin: Skin color, texture, turgor normal. No rashes or lesions Neurologic: Grossly normal Psych: Pleasant  EKG: Sinus tachycardia 124-personally reviewed  ASSESSMENT: 1. Sinus tachycardia - possibly related to CHF vs. Iatrogenic thyroid replacement 2. Recent lung cancer status post radiation chemotherapy 3. Morbid obesity 4. Type 2 diabetes 5. Hypertension 6. Dyslipidemia  PLAN: 1.   Mrs. Pereyra has persistent sinus tachycardia.  Her LV function is normal on echo.  Etiology is not clear however seems to be related to her lung cancer or chemotherapy.  She has had some improvement on the Toprol.  I like to add another 25 mg Toprol-XL at night and continue 50 mg in the morning.  We could continue to titrate this as long as blood pressure will tolerate it.  She ultimately may be a candidate for ivabradine.  Follow-up with APP in 6 months and me in 1 year.  Pixie Casino, MD, [facility], [place] Director of the Advanced Lipid Disorders &    Cardiovascular Risk Reduction Clinic Diplomate of the American Board of Clinical Lipidology Attending Cardiologist  Direct Dial: [phone]  Fax: [phone]  Website:  [name] 01/17/2018, 8:36 AM

## 2018-01-17 NOTE — Patient Instructions (Signed)
Medication Instructions:  INCREASE metoprolol succinate to 50mg  in the morning AND 25mg  in the evening If you need a refill on your cardiac medications before your next appointment, please call your pharmacy.     Follow-Up: At [facility] ([facility] you and your health needs are our priority.  As part of our continuing mission to provide you with exceptional heart care, we have created designated Provider Care Teams.  These Care Teams include your primary Cardiologist (physician) and Advanced Practice Providers (APPs -  Physician Assistants and Nurse Practitioners) who all work together to provide you with the care you need, when you need it. You will need a follow up appointment in 6 months.  Please call our office 2 months in advance to schedule this appointment.  You may see one of the following Advanced Practice Providers on your designated Care Team: Almyra Deforest, [place] . Fabian Sharp, PA-C  Any Other Special Instructions Will Be Listed Below (If Applicable).

## 2018-01-18 ENCOUNTER — Other Ambulatory Visit: Payer: Self-pay

## 2018-01-18 ENCOUNTER — Encounter: Payer: Self-pay | Admitting: Urology

## 2018-01-18 ENCOUNTER — Ambulatory Visit
Admission: RE | Admit: 2018-01-18 | Discharge: 2018-01-18 | Disposition: A | Discharge status: Home / Self Care | Payer: Medicaid Other | Source: Ambulatory Visit | Attending: Urology | Admitting: Urology

## 2018-01-18 VITALS — BP 124/81 | HR 109 | Temp 97.7°F | Resp 20 | Ht 69.0 in | Wt 279.0 lb

## 2018-01-18 DIAGNOSIS — C7989 Secondary malignant neoplasm of other specified sites: Secondary | ICD-10-CM | POA: Diagnosis not present

## 2018-01-18 DIAGNOSIS — C3490 Malignant neoplasm of unspecified part of unspecified bronchus or lung: Secondary | ICD-10-CM | POA: Diagnosis not present

## 2018-01-18 DIAGNOSIS — Z7982 Long term (current) use of aspirin: Secondary | ICD-10-CM | POA: Diagnosis not present

## 2018-01-18 DIAGNOSIS — Z923 Personal history of irradiation: Secondary | ICD-10-CM | POA: Insufficient documentation

## 2018-01-18 DIAGNOSIS — G893 Neoplasm related pain (acute) (chronic): Secondary | ICD-10-CM | POA: Diagnosis not present

## 2018-01-18 DIAGNOSIS — Z79899 Other long term (current) drug therapy: Secondary | ICD-10-CM | POA: Diagnosis not present

## 2018-01-18 DIAGNOSIS — C3491 Malignant neoplasm of unspecified part of right bronchus or lung: Secondary | ICD-10-CM

## 2018-01-18 DIAGNOSIS — Z794 Long term (current) use of insulin: Secondary | ICD-10-CM | POA: Insufficient documentation

## 2018-01-18 NOTE — Progress Notes (Signed)
Radiation Oncology         [phone] ________________________________  Name: Marilyn [name] MRN: [id number]  Date: 01/18/2018  DOB: May 31, 1965  Post Treatment Note  CC: [name], FNP  [name], MD  Diagnosis:   52 y.o. 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  12/07/16 - 12/20/16: The right flank was treated to 30 Gy in 10 fractions  01/05/16 - 02/13/16: The right lung was treated with 60 Gy in 30 fractions (concurrent with chemotherapy).  Narrative:  The patient returns today for routine follow-up.  She tolerated radiation treatment relatively well.  She experienced mild fatigue and some urinary irritation with frequency, urgency, and incomplete emptying. She denied dysuria or hematuria. She did not have any rectal bleeding, constipation, or diarrhea.                              On review of systems, the patient states that she is doing very well overall.  She reports complete resolution of the right lower abdominal/groin pain since completion of radiotherapy.  Unfortunately, she developed some yeast candidiasis in her bilateral groin and vaginal region which she has continued to struggle with despite use of nystatin cream as prescribed.  She continues with itching, burning and pain in the vaginal region and will follow up with her PCP later this week for further evaluation/treatment.  Otherwise, she denies abdominal pain, nausea, vomiting, diarrhea or constipation.  She reports a healthy appetite and is maintaining her weight.  She denies any significant fatigue.  Her systemic therapy was switched to immunotherapy with Nivolumab q2weeks on 12/13/16 in the setting of biopsy proven isolated metastatic disease.  She was recently seen in follow up with Dr. Irene Limbo and  continues to tolerate her immunotherapy very well.  Plans as of her visit on 01/06/18 were to continue immunotherapy with Nivolumab and follow up in 3 months with repeat systemic imaging prior to that visit.  ALLERGIES:  has No Known Allergies.  Meds: Current Outpatient Medications  Medication Sig Dispense Refill  . aspirin EC 81 MG tablet Take 1 tablet (81 mg total) by mouth daily. 60 tablet 2  . DULoxetine (CYMBALTA) 60 MG capsule TAKE 1 CAPSULE BY MOUTH EVERY DAY 30 capsule 2  . insulin aspart (NOVOLOG) 100 UNIT/ML FlexPen As per sliding scale instructions (Patient taking differently: Inject 0-10 Units into the skin 3 (three) times daily. As per sliding scale instructions) 15 mL 2  . Insulin Degludec (TRESIBA FLEXTOUCH) 200 UNIT/ML SOPN Inject 100 Units into the skin daily before breakfast.     . levothyroxine (SYNTHROID, LEVOTHROID) 150 MCG tablet Take 1 tablet (150 mcg total) by mouth daily before breakfast. 30 tablet 2  . lidocaine-prilocaine (EMLA) cream Apply 1 application topically as needed. 30 g 6  . metoprolol succinate (TOPROL-XL) 50 MG 24 hr tablet Take 1 tablet (50mg ) by mouth in the morning and 1/2 tablet (25mg ) in the evening. Take with or immediately following a meal. 135 tablet 3  . naproxen (NAPROSYN) 500 MG tablet Take 1 tablet (500 mg total) by mouth 2 (two) times daily with a meal. 20 tablet 0  . nystatin cream (MYCOSTATIN) Apply 1 application topically 2 (two) times  daily.    . oxyCODONE-acetaminophen (PERCOCET) 5-325 MG tablet Take 1 tablet by mouth every 6 (six) hours as needed for moderate pain or severe pain. 50 tablet 0  . pregabalin (LYRICA) 50 MG capsule Take 50 mg by mouth 3 (three) times daily.    [name] senna-docusate (SENNA S) 8.6-50 MG tablet Take 2 tablets by mouth 2 (two) times daily. May reduce to 2 tab po HS as needed once regular BM estabished (Patient taking differently: Take 2 tablets by mouth 2 (two) times daily as needed (FOR CONSTIPATION). =) 60 tablet 1  .  traZODone (DESYREL) 50 MG tablet Take 1-2 tablets (50-100 mg total) by mouth at bedtime as needed for sleep. 60 tablet 0   No current facility-administered medications for this encounter.    Facility-Administered Medications Ordered in Other Encounters  Medication Dose Route Frequency Provider Last Rate Last Dose  . sodium chloride 0.9 % injection 10 mL  10 mL Intravenous PRN [name], MD   10 mL at 05/24/16 1252    Physical Findings:  height is 5\' 9"  (1.753 m) and weight is 279 lb (126.6 kg). Her oral temperature is 97.7 F (36.5 C). Her blood pressure is 124/81 and her pulse is 109 (abnormal). Her respiration is 20 and oxygen saturation is 100%.  Pain Assessment Pain Score: 0-No pain/10 In general this is a well appearing African-American female in no acute distress.  She's alert and oriented x4 and appropriate throughout the examination. Cardiopulmonary assessment is negative for acute distress and she exhibits normal effort. There is no tenderness on palpation in the RLQ or right groin.  There is no residual skin irritation in the the treatment field.  Lab Findings: Lab Results  Component Value Date   WBC 4.0 01/09/2018   HGB 9.8 (L) 01/09/2018   HCT 29.7 (L) 01/09/2018   MCV 87.6 01/09/2018   PLT 274 01/09/2018     Radiographic Findings: Mr Jeri Cos MC Contrast  Result Date: 12/22/2017 CLINICAL DATA:  52 year old female with metastatic non-small cell lung cancer. Occipital and neck pain. EXAM: MRI HEAD WITHOUT AND WITH CONTRAST MRI CERVICAL SPINE WITHOUT AND WITH CONTRAST TECHNIQUE: Multiplanar, multiecho pulse sequences of the brain and surrounding structures, and cervical spine, to include the craniocervical junction and cervicothoracic junction, were obtained without and with intravenous contrast. CONTRAST:  10 milliliters Gadavist COMPARISON:  PET-CT 08/03/2017.  Brain MRI 11/19/2016, and earlier. FINDINGS: MRI HEAD FINDINGS Brain: No abnormal enhancement identified.  No midline shift, mass effect, or evidence of intracranial mass lesion. No dural thickening. Normal cerebral volume. No restricted diffusion to suggest acute infarction. No ventriculomegaly, extra-axial collection or acute intracranial hemorrhage. Cervicomedullary junction and pituitary are within normal limits. Scattered small cerebral white matter T2 and FLAIR hyperintense foci in both hemispheres are redemonstrated and mostly subcortical. These are stable since 2018 and nonspecific. No cortical encephalomalacia or chronic cerebral blood products identified. The deep gray matter nuclei, brainstem, and cerebellum remain normal. Vascular: Major intracranial vascular flow voids are stable since 2018. The vertebrobasilar system is diminutive probably due to fetal type PCA origins. The major dural venous sinuses are enhancing and appear patent. Skull and upper cervical spine: Cervical spine findings are below. Calvarium bone marrow signal, including in the occiput and at the skull base is stable since 2017 and within normal limits. Sinuses/Orbits: Normal orbits soft tissues. Trace paranasal sinus mucosal thickening today. Other: Mastoid air cells remain clear. Visible internal auditory structures appear normal. Normal stylomastoid foramina. Scalp and face  soft tissues appear within normal limits. MRI CERVICAL SPINE FINDINGS Alignment: Normal vertebral height and alignment. Vertebrae: Visualized bone marrow signal is within normal limits; increased intrinsic T1 marrow signal in the visible upper thoracic levels probably reflects prior radiation therapy. Visible skull base also appears within normal limits. No marrow edema or evidence of acute osseous abnormality. No abnormal enhancement identified. Cord: Spinal cord signal is within normal limits at all visualized levels. No abnormal intradural enhancement. No dural thickening. Posterior Fossa, vertebral arteries, paraspinal tissues: Preserved major vascular flow voids  in the neck. The left vertebral artery is dominant. Negative neck soft tissues. No neck mass or lymphadenopathy. Disc levels: Generally mild for age cervical spine degeneration. At C4-C5 there is disc bulging, facet and ligament flavum hypertrophy with borderline to mild spinal stenosis. At C5-C6 there is a small to moderate size central disc protrusion (series 6, image 17) superimposed on mild disc bulge and ligament flavum hypertrophy resulting in spinal stenosis with up to mild spinal cord mass effect. No cord signal abnormality. No foraminal stenosis. At C6-C7 there is left eccentric disc bulging with mild endplate spurring, facet and ligament flavum hypertrophy. Mild spinal stenosis and left C7 foraminal stenosis. No cord mass effect. IMPRESSION: 1. No metastatic disease identified in the head or cervical spine. Stable MRI appearance of the brain. 2. 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 No spinal cord signal abnormality or foraminal stenosis. Electronically Signed   By: Genevie Ann M.D.   On: 12/22/2017 10:25   Mr Cervical Spine W Wo Contrast  Result Date: 12/22/2017 CLINICAL DATA:  52 year old female with metastatic non-small cell lung cancer. Occipital and neck pain. EXAM: MRI HEAD WITHOUT AND WITH CONTRAST MRI CERVICAL SPINE WITHOUT AND WITH CONTRAST TECHNIQUE: Multiplanar, multiecho pulse sequences of the brain and surrounding structures, and cervical spine, to include the craniocervical junction and cervicothoracic junction, were obtained without and with intravenous contrast. CONTRAST:  10 milliliters Gadavist COMPARISON:  PET-CT 08/03/2017.  Brain MRI 11/19/2016, and earlier. FINDINGS: MRI HEAD FINDINGS Brain: No abnormal enhancement identified. No midline shift, mass effect, or evidence of intracranial mass lesion. No dural thickening. Normal cerebral volume. No restricted diffusion to suggest  acute infarction. No ventriculomegaly, extra-axial collection or acute intracranial hemorrhage. Cervicomedullary junction and pituitary are within normal limits. Scattered small cerebral white matter T2 and FLAIR hyperintense foci in both hemispheres are redemonstrated and mostly subcortical. These are stable since 2018 and nonspecific. No cortical encephalomalacia or chronic cerebral blood products identified. The deep gray matter nuclei, brainstem, and cerebellum remain normal. Vascular: Major intracranial vascular flow voids are stable since 2018. The vertebrobasilar system is diminutive probably due to fetal type PCA origins. The major dural venous sinuses are enhancing and appear patent. Skull and upper cervical spine: Cervical spine findings are below. Calvarium bone marrow signal, including in the occiput and at the skull base is stable since 2017 and within normal limits. Sinuses/Orbits: Normal orbits soft tissues. Trace paranasal sinus mucosal thickening today. Other: Mastoid air cells remain clear. Visible internal auditory structures appear normal. Normal stylomastoid foramina. Scalp and face soft tissues appear within normal limits. MRI CERVICAL SPINE FINDINGS Alignment: Normal vertebral height and alignment. Vertebrae: Visualized bone marrow signal is within normal limits; increased intrinsic T1 marrow signal in the visible upper thoracic levels probably reflects prior radiation therapy. Visible skull base also appears within normal limits. No marrow edema or evidence of acute  osseous abnormality. No abnormal enhancement identified. Cord: Spinal cord signal is within normal limits at all visualized levels. No abnormal intradural enhancement. No dural thickening. Posterior Fossa, vertebral arteries, paraspinal tissues: Preserved major vascular flow voids in the neck. The left vertebral artery is dominant. Negative neck soft tissues. No neck mass or lymphadenopathy. Disc levels: Generally mild for age  cervical spine degeneration. At C4-C5 there is disc bulging, facet and ligament flavum hypertrophy with borderline to mild spinal stenosis. At C5-C6 there is a small to moderate size central disc protrusion (series 6, image 17) superimposed on mild disc bulge and ligament flavum hypertrophy resulting in spinal stenosis with up to mild spinal cord mass effect. No cord signal abnormality. No foraminal stenosis. At C6-C7 there is left eccentric disc bulging with mild endplate spurring, facet and ligament flavum hypertrophy. Mild spinal stenosis and left C7 foraminal stenosis. No cord mass effect. IMPRESSION: 1. No metastatic disease identified in the head or cervical spine. Stable MRI appearance of the brain. 2. 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 No spinal cord signal abnormality or foraminal stenosis. Electronically Signed   By: Genevie Ann M.D.   On: 12/22/2017 10:25    Impression/Plan: 1. 52 y.o. 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 She appears to have recovered well from her recent radiotherapy.  We discussed that while we are happy to continue to participate in her care if clinically indicated, at this point, we will plan to see her back on as-needed basis.  She will continue in routine follow-up for disease management under the care and direction of Dr. Irene Limbo with her next scheduled visit on 02/06/18 and plans for repeat systemic imaging prior to that visit.  She knows to call at anytime with any questions or concerns related to her previous radiotherapy.  She is comfortable with and in agreement with the stated plan.    Nicholos Johns, PA-C

## 2018-01-23 ENCOUNTER — Inpatient Hospital Stay: Payer: Medicaid Other

## 2018-01-23 DIAGNOSIS — Z7189 Other specified counseling: Secondary | ICD-10-CM

## 2018-01-23 DIAGNOSIS — C3491 Malignant neoplasm of unspecified part of right bronchus or lung: Secondary | ICD-10-CM

## 2018-01-23 DIAGNOSIS — C792 Secondary malignant neoplasm of skin: Secondary | ICD-10-CM

## 2018-01-23 DIAGNOSIS — Z5112 Encounter for antineoplastic immunotherapy: Secondary | ICD-10-CM | POA: Diagnosis not present

## 2018-01-23 DIAGNOSIS — Z95828 Presence of other vascular implants and grafts: Secondary | ICD-10-CM

## 2018-01-23 DIAGNOSIS — Z452 Encounter for adjustment and management of vascular access device: Secondary | ICD-10-CM

## 2018-01-23 LAB — CMP (CANCER CENTER ONLY)
ALBUMIN: 3.2 g/dL — AB (ref 3.5–5.0)
ALT: 13 U/L (ref 0–44)
ANION GAP: 12 (ref 5–15)
AST: 16 U/L (ref 15–41)
Alkaline Phosphatase: 108 U/L (ref 38–126)
BILIRUBIN TOTAL: 0.3 mg/dL (ref 0.3–1.2)
BUN: 8 mg/dL (ref 6–20)
CALCIUM: 9 mg/dL (ref 8.9–10.3)
CO2: 27 mmol/L (ref 22–32)
Chloride: 103 mmol/L (ref 98–111)
Creatinine: 0.83 mg/dL (ref 0.44–1.00)
Glucose, Bld: 191 mg/dL — ABNORMAL HIGH (ref 70–99)
POTASSIUM: 3.8 mmol/L (ref 3.5–5.1)
Sodium: 142 mmol/L (ref 135–145)
TOTAL PROTEIN: 8 g/dL (ref 6.5–8.1)

## 2018-01-23 LAB — CBC WITH DIFFERENTIAL/PLATELET
Abs Immature Granulocytes: 0.02 10*3/uL (ref 0.00–0.07)
Basophils Absolute: 0 10*3/uL (ref 0.0–0.1)
Basophils Relative: 0 %
EOS ABS: 0.5 10*3/uL (ref 0.0–0.5)
EOS PCT: 8 %
HEMATOCRIT: 33 % — AB (ref 36.0–46.0)
Hemoglobin: 10.5 g/dL — ABNORMAL LOW (ref 12.0–15.0)
Immature Granulocytes: 0 %
LYMPHS ABS: 1 10*3/uL (ref 0.7–4.0)
Lymphocytes Relative: 17 %
MCH: 28.7 pg (ref 26.0–34.0)
MCHC: 31.8 g/dL (ref 30.0–36.0)
MCV: 90.2 fL (ref 80.0–100.0)
MONO ABS: 0.3 10*3/uL (ref 0.1–1.0)
Monocytes Relative: 6 %
NRBC: 0 % (ref 0.0–0.2)
Neutro Abs: 4.3 10*3/uL (ref 1.7–7.7)
Neutrophils Relative %: 69 %
Platelets: 277 10*3/uL (ref 150–400)
RBC: 3.66 MIL/uL — ABNORMAL LOW (ref 3.87–5.11)
RDW: 13.2 % (ref 11.5–15.5)
WBC: 6.2 10*3/uL (ref 4.0–10.5)

## 2018-01-23 MED ORDER — HEPARIN SOD (PORK) LOCK FLUSH 100 UNIT/ML IV SOLN
500.0000 [IU] | Freq: Once | INTRAVENOUS | Status: AC | PRN
  Administered 2018-01-23: 500 [IU] via INTRAVENOUS
  Filled 2018-01-23: qty 5

## 2018-01-23 MED ORDER — SODIUM CHLORIDE 0.9 % IJ SOLN
10.0000 mL | INTRAMUSCULAR | Status: DC | PRN
  Administered 2018-01-23: 10 mL via INTRAVENOUS
  Filled 2018-01-23: qty 10

## 2018-01-23 NOTE — Addendum Note (Signed)
Addended by: Carlene Coria L on: 01/23/2018 12:54 PM   Modules accepted: Orders

## 2018-01-24 ENCOUNTER — Inpatient Hospital Stay: Payer: Medicaid Other

## 2018-01-24 VITALS — BP 136/90 | HR 121 | Temp 97.8°F | Resp 20

## 2018-01-24 DIAGNOSIS — Z7189 Other specified counseling: Secondary | ICD-10-CM

## 2018-01-24 DIAGNOSIS — C792 Secondary malignant neoplasm of skin: Secondary | ICD-10-CM

## 2018-01-24 DIAGNOSIS — C3491 Malignant neoplasm of unspecified part of right bronchus or lung: Secondary | ICD-10-CM

## 2018-01-24 DIAGNOSIS — Z5112 Encounter for antineoplastic immunotherapy: Secondary | ICD-10-CM | POA: Diagnosis not present

## 2018-01-24 MED ORDER — SODIUM CHLORIDE 0.9 % IV SOLN
Freq: Once | INTRAVENOUS | Status: AC
  Administered 2018-01-24: 10:00:00 via INTRAVENOUS
  Filled 2018-01-24: qty 250

## 2018-01-24 MED ORDER — SODIUM CHLORIDE 0.9% FLUSH
10.0000 mL | INTRAVENOUS | Status: DC | PRN
  Filled 2018-01-24: qty 10

## 2018-01-24 MED ORDER — HEPARIN SOD (PORK) LOCK FLUSH 100 UNIT/ML IV SOLN
500.0000 [IU] | Freq: Once | INTRAVENOUS | Status: DC | PRN
  Filled 2018-01-24: qty 5

## 2018-01-24 MED ORDER — SODIUM CHLORIDE 0.9 % IV SOLN
240.0000 mg | Freq: Once | INTRAVENOUS | Status: AC
  Administered 2018-01-24: 240 mg via INTRAVENOUS
  Filled 2018-01-24: qty 24

## 2018-01-24 NOTE — Patient Instructions (Signed)
[facility] Discharge Instructions for Patients Receiving Chemotherapy  Today you received the following chemotherapy agents Opdivo  To help prevent nausea and vomiting after your treatment, we encourage you to take your nausea medication as directed   If you develop nausea and vomiting that is not controlled by your nausea medication, call the clinic.   BELOW ARE SYMPTOMS THAT SHOULD BE REPORTED IMMEDIATELY:  *FEVER GREATER THAN 100.5 F  *CHILLS WITH OR WITHOUT FEVER  NAUSEA AND VOMITING THAT IS NOT CONTROLLED WITH YOUR NAUSEA MEDICATION  *UNUSUAL SHORTNESS OF BREATH  *UNUSUAL BRUISING OR BLEEDING  TENDERNESS IN MOUTH AND THROAT WITH OR WITHOUT PRESENCE OF ULCERS  *URINARY PROBLEMS  *BOWEL PROBLEMS  UNUSUAL RASH Items with * indicate a potential emergency and should be followed up as soon as possible.  Feel free to call the clinic should you have any questions or concerns. The clinic phone number is [phone].  Please show the CHEMO ALERT CARD at check-in to the Emergency Department and triage nurse.   

## 2018-01-27 ENCOUNTER — Other Ambulatory Visit: Payer: Self-pay | Admitting: Hematology

## 2018-02-01 ENCOUNTER — Telehealth: Payer: Self-pay | Admitting: Hematology

## 2018-02-01 NOTE — Telephone Encounter (Signed)
GK out nov 4-nov 17. Per [facility]K cancelled nov 4 follow up and moved to nov 18. Spoke to patients about new times Nov 4 and Nov 18.

## 2018-02-06 ENCOUNTER — Ambulatory Visit: Payer: Medicaid Other | Admitting: Hematology

## 2018-02-06 ENCOUNTER — Inpatient Hospital Stay: Payer: Medicaid Other

## 2018-02-06 ENCOUNTER — Inpatient Hospital Stay: Payer: Medicaid Other | Attending: Hematology

## 2018-02-06 ENCOUNTER — Other Ambulatory Visit: Payer: Medicaid Other

## 2018-02-06 VITALS — BP 128/82 | HR 101 | Temp 99.3°F | Resp 18

## 2018-02-06 DIAGNOSIS — M940 Chondrocostal junction syndrome [Tietze]: Secondary | ICD-10-CM | POA: Insufficient documentation

## 2018-02-06 DIAGNOSIS — Z7982 Long term (current) use of aspirin: Secondary | ICD-10-CM | POA: Diagnosis not present

## 2018-02-06 DIAGNOSIS — Z923 Personal history of irradiation: Secondary | ICD-10-CM | POA: Diagnosis not present

## 2018-02-06 DIAGNOSIS — C792 Secondary malignant neoplasm of skin: Secondary | ICD-10-CM

## 2018-02-06 DIAGNOSIS — Z79899 Other long term (current) drug therapy: Secondary | ICD-10-CM | POA: Diagnosis not present

## 2018-02-06 DIAGNOSIS — R000 Tachycardia, unspecified: Secondary | ICD-10-CM | POA: Diagnosis not present

## 2018-02-06 DIAGNOSIS — R5383 Other fatigue: Secondary | ICD-10-CM | POA: Insufficient documentation

## 2018-02-06 DIAGNOSIS — Z9221 Personal history of antineoplastic chemotherapy: Secondary | ICD-10-CM | POA: Diagnosis not present

## 2018-02-06 DIAGNOSIS — C3491 Malignant neoplasm of unspecified part of right bronchus or lung: Secondary | ICD-10-CM | POA: Diagnosis not present

## 2018-02-06 DIAGNOSIS — E119 Type 2 diabetes mellitus without complications: Secondary | ICD-10-CM | POA: Insufficient documentation

## 2018-02-06 DIAGNOSIS — E039 Hypothyroidism, unspecified: Secondary | ICD-10-CM | POA: Diagnosis not present

## 2018-02-06 DIAGNOSIS — G4700 Insomnia, unspecified: Secondary | ICD-10-CM | POA: Diagnosis not present

## 2018-02-06 DIAGNOSIS — C778 Secondary and unspecified malignant neoplasm of lymph nodes of multiple regions: Secondary | ICD-10-CM | POA: Diagnosis not present

## 2018-02-06 DIAGNOSIS — Z5112 Encounter for antineoplastic immunotherapy: Secondary | ICD-10-CM | POA: Diagnosis not present

## 2018-02-06 DIAGNOSIS — E7800 Pure hypercholesterolemia, unspecified: Secondary | ICD-10-CM | POA: Diagnosis not present

## 2018-02-06 DIAGNOSIS — E785 Hyperlipidemia, unspecified: Secondary | ICD-10-CM | POA: Insufficient documentation

## 2018-02-06 DIAGNOSIS — E669 Obesity, unspecified: Secondary | ICD-10-CM | POA: Insufficient documentation

## 2018-02-06 DIAGNOSIS — Z794 Long term (current) use of insulin: Secondary | ICD-10-CM | POA: Diagnosis not present

## 2018-02-06 DIAGNOSIS — J45909 Unspecified asthma, uncomplicated: Secondary | ICD-10-CM | POA: Diagnosis not present

## 2018-02-06 DIAGNOSIS — E1140 Type 2 diabetes mellitus with diabetic neuropathy, unspecified: Secondary | ICD-10-CM | POA: Insufficient documentation

## 2018-02-06 DIAGNOSIS — M129 Arthropathy, unspecified: Secondary | ICD-10-CM | POA: Diagnosis not present

## 2018-02-06 DIAGNOSIS — G893 Neoplasm related pain (acute) (chronic): Secondary | ICD-10-CM | POA: Diagnosis not present

## 2018-02-06 DIAGNOSIS — I10 Essential (primary) hypertension: Secondary | ICD-10-CM | POA: Insufficient documentation

## 2018-02-06 DIAGNOSIS — D649 Anemia, unspecified: Secondary | ICD-10-CM | POA: Insufficient documentation

## 2018-02-06 DIAGNOSIS — Z7189 Other specified counseling: Secondary | ICD-10-CM

## 2018-02-06 LAB — CMP (CANCER CENTER ONLY)
ALBUMIN: 3.1 g/dL — AB (ref 3.5–5.0)
ALK PHOS: 80 U/L (ref 38–126)
ALT: 10 U/L (ref 0–44)
AST: 15 U/L (ref 15–41)
Anion gap: 9 (ref 5–15)
BUN: 7 mg/dL (ref 6–20)
CALCIUM: 8.8 mg/dL — AB (ref 8.9–10.3)
CO2: 31 mmol/L (ref 22–32)
Chloride: 104 mmol/L (ref 98–111)
Creatinine: 0.81 mg/dL (ref 0.44–1.00)
GFR, Est AFR Am: >60 mL/min (ref >60)
GFR, Estimated: >60 mL/min (ref >60)
GLUCOSE: 172 mg/dL — AB (ref 70–99)
Potassium: 3.7 mmol/L (ref 3.5–5.1)
SODIUM: 144 mmol/L (ref 135–145)
TOTAL PROTEIN: 7.2 g/dL (ref 6.5–8.1)

## 2018-02-06 LAB — CBC WITH DIFFERENTIAL/PLATELET
ABS IMMATURE GRANULOCYTES: 0.02 10*3/uL (ref 0.00–0.07)
BASOS PCT: 0 %
Basophils Absolute: 0 10*3/uL (ref 0.0–0.1)
EOS ABS: 0.3 10*3/uL (ref 0.0–0.5)
Eosinophils Relative: 5 %
HEMATOCRIT: 30.4 % — AB (ref 36.0–46.0)
Hemoglobin: 9.6 g/dL — ABNORMAL LOW (ref 12.0–15.0)
Immature Granulocytes: 0 %
LYMPHS ABS: 0.8 10*3/uL (ref 0.7–4.0)
Lymphocytes Relative: 15 %
MCH: 29 pg (ref 26.0–34.0)
MCHC: 31.6 g/dL (ref 30.0–36.0)
MCV: 91.8 fL (ref 80.0–100.0)
MONO ABS: 0.3 10*3/uL (ref 0.1–1.0)
MONOS PCT: 5 %
Neutro Abs: 4.1 10*3/uL (ref 1.7–7.7)
Neutrophils Relative %: 75 %
Platelets: 253 10*3/uL (ref 150–400)
RBC: 3.31 MIL/uL — ABNORMAL LOW (ref 3.87–5.11)
RDW: 13.3 % (ref 11.5–15.5)
WBC: 5.5 10*3/uL (ref 4.0–10.5)
nRBC: 0 % (ref 0.0–0.2)

## 2018-02-06 LAB — TSH: TSH: 11.332 u[IU]/mL — ABNORMAL HIGH (ref 0.308–3.960)

## 2018-02-06 MED ORDER — SODIUM CHLORIDE 0.9 % IV SOLN
240.0000 mg | Freq: Once | INTRAVENOUS | Status: AC
  Administered 2018-02-06: 240 mg via INTRAVENOUS
  Filled 2018-02-06: qty 24

## 2018-02-06 MED ORDER — SODIUM CHLORIDE 0.9 % IV SOLN
Freq: Once | INTRAVENOUS | Status: AC
  Administered 2018-02-06: 15:00:00 via INTRAVENOUS
  Filled 2018-02-06: qty 250

## 2018-02-06 MED ORDER — SODIUM CHLORIDE 0.9% FLUSH
10.0000 mL | INTRAVENOUS | Status: DC | PRN
  Administered 2018-02-06: 10 mL
  Filled 2018-02-06: qty 10

## 2018-02-06 MED ORDER — HEPARIN SOD (PORK) LOCK FLUSH 100 UNIT/ML IV SOLN
500.0000 [IU] | Freq: Once | INTRAVENOUS | Status: AC | PRN
  Administered 2018-02-06: 500 [IU]
  Filled 2018-02-06: qty 5

## 2018-02-06 MED ORDER — SODIUM CHLORIDE 0.9 % IJ SOLN
10.0000 mL | INTRAMUSCULAR | Status: DC | PRN
  Filled 2018-02-06: qty 10

## 2018-02-17 NOTE — Progress Notes (Signed)
[name]    HEMATOLOGY/ONCOLOGY CLINIC NOTE  Date of Service: 02/20/18     Patient Care Team: [name], FNP as PCP - General (Nurse Practitioner) Pixie Casino, MD as PCP - Cardiology (Cardiology)  CHIEF COMPLAINTS  F/u for continued management of metastatic lung adenocarcinoma  Diagnosis Metastatic Lung Adenocarcinoma with rt flank subcutaneous metastasis.  Current treatment:  Nivolumab q2 weeks Palliative RT to pain rt inguinal mass   PreviousTreatment -Concurrent Chemo-radiation with Carboplatin/Taxol. Completed RT on 02/13/2016 and has then completed 2 cycles of carboplatin + taxol. -Started Maintenance Durvalumab from  05/24/2016 q2weeks, switched to Nivolumab q2weeks on 12/13/16 once metastatic disease noted -s/p palliative RT to rt sided Subcutaneous metastatic mass.    HISTORY OF PRESENTING ILLNESS: Plz see my previous note for details on initial presentation.  INTERVAL HISTORY   Marilyn [name] presents to the office today for followup of her metastatic lung cancer. The patient's last visit with [place] was on 01/09/18. The pt reports that she is doing well overall.   The pt reports that she has had a cold for two weeks, denies fevers, and denies seeing medical evaluation. She notes that she has some greenish discharge, and a post-nasal drip. The pt notes that she has had more SOB when walking between rooms in her house, which has been present for the last week and a half. She denies chest pain, except when coughing.   The pt notes that her inguinal pain has settled down after receiving radiation. She also notes that she has been using eczema cream on her labia, with symptomatic relief.   The pt notes that she forgot to take her thyroid medication for about one week in the interim, and returned to her medication 3 days ago. She notes that she has not taken her thyroid medication regularly. The pt notes that her blood sugars have been higher recently, above 250 in the  last couple days.   Lab results today (02/20/18) of CBC w/diff and CMP is as follows: all values are WNL except for RBC at 3.67, HGB at 10.6, HCT at 33.1, Glucose at 131, Albumin at 3.4. 02/20/18 TSH is pending 02/20/18 T4 is pending  On review of systems, pt reports green nasal discharge for two weeks, moving her bowels well, cough, recent SOB when walking, and denies fevers, abdominal pains, leg swelling, and any other symptoms.   MEDICAL HISTORY:  Past Medical History:  Diagnosis Date  . Arthritis   . Asthma   . Diabetes mellitus    Type II  . Hemorrhoids   . Hypercholesteremia   . Hypertension   . Hypothyroidism   . Metastatic lung cancer (metastasis from lung to other site) (HCC)    Lung, Mets to skin- right flank. CT angio 2019 suspicious for axillary, external iliac, right inguinal mets  . 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 NICM (nonischemic cardiomyopathy) ([place])    a. mild - EF 45-50% by echo 10/2016 but EF 52% by low risk nuc 11/2016. b. Echo 07/2017 - EF normal, mild AI, unremarkable echo otherwise.  . Obese   . Pneumonia 2017  . Sinus tachycardia    a. dates back to at least 2013, etiology unclear.    SURGICAL HISTORY: Past Surgical History:  Procedure Laterality Date  . CESAREAN SECTION    . COLONOSCOPY    . I/D  arm Right 2013  . IR FLUORO GUIDE PORT INSERTION RIGHT  02/18/2017  . IR  GENERIC HISTORICAL  01/08/2016   IR US GUIDE VASC ACCESS RIGHT 01/08/2016 [name], DO [facility]  . IR GENERIC HISTORICAL  01/08/2016   IR FLUORO GUIDE CV LINE RIGHT 01/08/2016 [name], DO [facility]  . IR US GUIDE VASC ACCESS RIGHT  02/18/2017  . MULTIPLE EXTRACTIONS WITH ALVEOLOPLASTY N/A 03/25/2017   Procedure: MULTIPLE EXTRACTION WITH ALVEOLOPLASTY (teeth #s four, six, seven, eight, nine, ten, eleven, one, twenty-three, twenty-four, twenty-five, twenty-six, twenty-nine, thirty);  Surgeon: [name], [name];  Location: [facility];  Service: Oral  Surgery;  Laterality: N/A;  . TUBAL LIGATION    . US guided core needle biopsy     of right lower neck/supraclavicular lymph nodes    SOCIAL HISTORY: Social History   Socioeconomic History  . Marital status: Divorced    Spouse name: Not on file  . Number of children: Not on file  . Years of education: Not on file  . Highest education level: Not on file  Occupational History  . Not on file  Social Needs  . Financial resource strain: Not on file  . Food insecurity:    Worry: Not on file    Inability: Not on file  . Transportation needs:    Medical: No    Non-medical: No  Tobacco Use  . Smoking status: Never Smoker  . Smokeless tobacco: Never Used  Substance and Sexual Activity  . Alcohol use: No  . Drug use: No  . Sexual activity: Not Currently  Lifestyle  . Physical activity:    Days per week: Not on file    Minutes per session: Not on file  . Stress: Not on file  Relationships  . Social connections:    Talks on phone: Not on file    Gets together: Not on file    Attends religious service: Not on file    Active member of club or organization: Not on file    Attends meetings of clubs or organizations: Not on file    Relationship status: Not on file  . Intimate partner violence:    Fear of current or ex partner: No    Emotionally abused: No    Physically abused: No    Forced sexual activity: No  Other Topics Concern  . Not on file  Social History Narrative   No bird or mold exposure. No recent travel.   11-23-17 Unable to ask abuse questions mother with her today.    FAMILY HISTORY: Family History  Problem Relation Age of Onset  . Heart failure Mother   . Hypertension Mother   . Cancer Neg Hx   . Rheumatologic disease Neg Hx     ALLERGIES:  has No Known Allergies.  MEDICATIONS:  Current Outpatient Medications  Medication Sig Dispense Refill  . aspirin EC 81 MG tablet Take 1 tablet (81 mg total) by mouth daily. 60 tablet 2  . DULoxetine (CYMBALTA)  60 MG capsule TAKE 1 CAPSULE BY MOUTH EVERY DAY 30 capsule 2  . insulin aspart (NOVOLOG) 100 UNIT/ML FlexPen As per sliding scale instructions (Patient taking differently: Inject 0-10 Units into the skin 3 (three) times daily. As per sliding scale instructions) 15 mL 2  . Insulin Degludec (TRESIBA FLEXTOUCH) 200 UNIT/ML SOPN Inject 100 Units into the skin daily before breakfast.     . levofloxacin (LEVAQUIN) 500 MG tablet Take 1 tablet (500 mg total) by mouth daily. 5 tablet 0  . levothyroxine (SYNTHROID, LEVOTHROID) 150 MCG tablet Take 1 tablet (150 mcg total)  by mouth daily before breakfast. 30 tablet 2  . lidocaine-prilocaine (EMLA) cream Apply 1 application topically as needed. 30 g 6  . metoprolol succinate (TOPROL-XL) 50 MG 24 hr tablet Take 1 tablet (42m) by mouth in the morning and 1/2 tablet (283m in the evening. Take with or immediately following a meal. 135 tablet 3  . naproxen (NAPROSYN) 500 MG tablet Take 1 tablet (500 mg total) by mouth 2 (two) times daily with a meal. 20 tablet 0  . nystatin cream (MYCOSTATIN) Apply 1 application topically 2 (two) times daily.    . [name] KitchenxyCODONE-acetaminophen (PERCOCET) 5-325 MG tablet Take 1 tablet by mouth every 6 (six) hours as needed for moderate pain or severe pain. 50 tablet 0  . pregabalin (LYRICA) 50 MG capsule Take 50 mg by mouth 3 (three) times daily.    . [name] Kitchenenna-docusate (SENNA S) 8.6-50 MG tablet Take 2 tablets by mouth 2 (two) times daily. May reduce to 2 tab po HS as needed once regular BM estabished (Patient taking differently: Take 2 tablets by mouth 2 (two) times daily as needed (FOR CONSTIPATION). =) 60 tablet 1  . traZODone (DESYREL) 50 MG tablet Take 1-2 tablets (50-100 mg total) by mouth at bedtime as needed for sleep. 60 tablet 0   No current facility-administered medications for this visit.    Facility-Administered Medications Ordered in Other Visits  Medication Dose Route Frequency Provider Last Rate Last Dose  . sodium chloride  0.9 % injection 10 mL  10 mL Intravenous PRN KaBrunetta GeneraMD   10 mL at 05/24/16 1252    REVIEW OF SYSTEMS:    A 10+ POINT REVIEW OF SYSTEMS WAS OBTAINED including neurology, dermatology, psychiatry, cardiac, respiratory, lymph, extremities, GI, GU, Musculoskeletal, constitutional, breasts, reproductive, HEENT.  All pertinent positives are noted in the HPI.  All others are negative.   PHYSICAL EXAMINATION: ECOG PERFORMANCE STATUS: 1 - Symptomatic but completely ambulatory  Vitals:   02/20/18 1147  BP: 111/85  Pulse: (!) 127  Resp: 18  Temp: 98.4 F (36.9 C)  SpO2: 99%   Filed Weights   02/20/18 1147  Weight: 280 lb 1.6 oz (127.1 kg)   .Body mass index is 41.36 kg/m.  GENERAL:alert, in no acute distress and comfortable SKIN: no acute rashes, no significant lesions EYES: conjunctiva are pink and non-injected, sclera anicteric OROPHARYNX: MMM, no exudates, no oropharyngeal erythema or ulceration NECK: supple, no JVD LYMPH:  no palpable lymphadenopathy in the cervical, axillary or inguinal regions LUNGS: clear to auscultation b/l with normal respiratory effort HEART: regular rate & rhythm ABDOMEN:  normoactive bowel sounds , non tender, not distended. No palpable hepatosplenomegaly.  Extremity: no pedal edema PSYCH: alert & oriented x 3 with fluent speech NEURO: no focal motor/sensory deficits   LABORATORY DATA: .  . [name] KitchenBC Latest Ref Rng & Units 02/20/2018 02/06/2018 01/23/2018  WBC 4.0 - 10.5 K/uL 5.2 5.5 6.2  Hemoglobin 12.0 - 15.0 g/dL 10.6(L) 9.6(L) 10.5(L)  Hematocrit 36.0 - 46.0 % 33.1(L) 30.4(L) 33.0(L)  Platelets 150 - 400 K/uL 247 253 277    . CMP Latest Ref Rng & Units 02/20/2018 02/06/2018 01/23/2018  Glucose 70 - 99 mg/dL 131(H) 172(H) 191(H)  BUN 6 - 20 mg/dL '9 7 8  ' Creatinine 0.44 - 1.00 mg/dL 0.84 0.81 0.83  Sodium 135 - 145 mmol/L 141 144 142  Potassium 3.5 - 5.1 mmol/L 3.7 3.7 3.8  Chloride 98 - 111 mmol/L 105 104 103  CO2 22 - 32 mmol/L 27  31 27  Calcium 8.9 - 10.3 mg/dL 9.4 8.8(L) 9.0  Total Protein 6.5 - 8.1 g/dL 8.0 7.2 8.0  Total Bilirubin 0.3 - 1.2 mg/dL 0.3 <0.2(L) 0.3  Alkaline Phos 38 - 126 U/L 84 80 108  AST 15 - 41 U/L '24 15 16  ' ALT 0 - 44 U/L '11 10 13               ' 08/18/17 Bx:   08/16/17 Molecular Pathology:    RADIOGRAPHIC STUDIES:  .No results found.   ASSESSMENT & PLAN:   52 y.o.  African-American female with history of hypertension, diabetes, asthma, dysuria mass and morbid obesity with    #1 h/o Lung Adenocarcinoma Rt sided at least Stage IIIB (on diagnosis) with large right paratracheal mass with mediastinal adenopathy that appears to have grown significantly over the last 6-7 months and rt supraclavicular LN +.  -Noted to have a small mass in the left adrenal on CT but PET/CT neg for metastatic disease. Patient has been a lifelong nonsmoker -MRI of the brain was negative for any metastatic disease. At diagnosis. -High PDL1 expression (90%) on foundation One Neg for EGFR, ALK, ROS-1 and BRAF mutations. -Patient completed her planned definitive chemoradiation with Carbo Taxol on 02/13/2016. No prohibitive toxicities other than some grade 1 skin desquamation and some grade 1-2 radiation esophagitis. She has subsequently received 2 out of 2 planned dose of carboplatin + Taxol. -CTA chest 10/29/2016  no evidence of lung cancer progression in the chest. No PE -CT abd/pelvis-10/29/2016  Interval 5.0 x 5.0 x 4.7 cm mass in the right lateral subcutaneous fat at the level of the mid abdomen. This has CT features compatible with a metastasis or primary neoplasm. -CT C/A/P (05/12/2017): Interval development of right axillary, right external iliac and right inguinal adenopathy. Suspicious for nodal metastasis. 2. Decrease in size of right paratracheal adenopathy. 3. Decrease in size and enhancement associated with right lateral body wall lesion. 4. New small nonspecific pulmonary nodule in the left lower  lobe measuring 6 mm. 5. Stable appearance of changes secondary to external beam radiation within the right lung  08/03/17 PET which revealed 2.8 cm right axillary lymph node is markedly hypermetabolic and consistent with metastatic disease. Extensive radiation changes involving the right paramediastinal lung. There is a focus of hypermetabolism in the right lower lobe which is suspicious for residual or recurrent tumor.  11/11/17 CT C/A/P revealed There has been interval increase in number of enlarged of right axillary nodes and increase in size of right inguinal adenopathy compatible with progression of disease. Stable appearance of the right lung with changes of external beam radiation.    #2 Now with Metastatic lung adenocarcinoma with isolated metastases in the right lateral subcutaneous fat at the level of the midabdomen. she received maintenance Durvalumab from  05/24/2016 q2weeks, switched to Nivolumab q2weeks on 12/13/16 in the setting of Biopsy proven isolated metastatic disease. Given the tumor is strongly PDL1 positive and patient has only isolated metastatic disease was treated with palliative RT to metastases with significant improvement. Molecular pathology from 515/19 did not reveal obviously targetable mutations with NF1 and TP53 mutations present.  Plan:  -No meaningful progression of symptoms or solid organ involvement  -Advised that pt continue follow up with her cardiologist -Advised patient to follow up with her PCP regarding her Lyrica dose. -Discussed pt labwork today, 02/20/18; blood counts and chemistries are stable  -Advised that pt do her best to begin taking her thyroid medication regularly -Advised  that the pt continue to check her blood sugars regularly -Will repeat CT C/A/P in 12 days  -Will order empiric Levofloxacin  -Continue Nivolumab every 2 weeks -Will see the pt back in 2 weeks   #3 h/o radiation pneumonitis - no new symptoms. Does has some cough with exertion  but this has improved.  #4 Improving fatigue (grade 1) - improved fatigue with levothyroxine compliance with near normalization of FT4 levels.  - previously given TSH of 12 -- increased Levolthyroxine from 176mg to 1545m  ---will likely need larger dose but in the setting of baseline tachycardia we are gradually increasing dose to avoid ppt cardiac arrhythmias.  -TSH 11/14/2017 WNL.  #5 normocytic anemia related to chemotherapy -resolving and stable  #6 Left chest wall pain due to costochondritis - mx with pain medications. CTA x 3 neg for PE. No significant pain at this time.  #7 Sinus tachycardia - on BB per cards , no PE on CTA x 3 , -Still having sinus tachycardia . Partly from deconditioning and anemia. Seeing Dr. HiDebara Pickettrom cardiology  -Continues to be on BB  #8 neoplasm related pain is currently controlled without significant medications. Clinically likely has sleep apnea . Would need to be careful with sedative medications . 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 The patient knows to call the clinic with any problems, questions or concerns.  The total time spent in the appt was 25 minutes and more than 50% was on counseling and direct patient cares.    GautSullivan LoneMS ACarmichaelsIVMS SCH [facility] Incatology/Oncology Physician [facility]):       [phone] cell):  [phone]):           [phone] SchuBaldwin [place] acting as a scribe  for Dr. GautSullivan Lone.I have reviewed the above documentation for accuracy and completeness, and I agree with the above. .GauBrunetta Genera

## 2018-02-20 ENCOUNTER — Ambulatory Visit: Payer: Medicaid Other

## 2018-02-20 ENCOUNTER — Inpatient Hospital Stay: Payer: Medicaid Other

## 2018-02-20 ENCOUNTER — Inpatient Hospital Stay (HOSPITAL_BASED_OUTPATIENT_CLINIC_OR_DEPARTMENT_OTHER): Payer: Medicaid Other | Admitting: Hematology

## 2018-02-20 ENCOUNTER — Other Ambulatory Visit: Payer: Medicaid Other

## 2018-02-20 ENCOUNTER — Encounter: Payer: Self-pay | Admitting: Hematology

## 2018-02-20 ENCOUNTER — Other Ambulatory Visit: Payer: Self-pay

## 2018-02-20 VITALS — BP 111/85 | HR 127 | Temp 98.4°F | Resp 18 | Ht 69.0 in | Wt 280.1 lb

## 2018-02-20 VITALS — HR 120

## 2018-02-20 DIAGNOSIS — E785 Hyperlipidemia, unspecified: Secondary | ICD-10-CM

## 2018-02-20 DIAGNOSIS — E669 Obesity, unspecified: Secondary | ICD-10-CM

## 2018-02-20 DIAGNOSIS — J45909 Unspecified asthma, uncomplicated: Secondary | ICD-10-CM

## 2018-02-20 DIAGNOSIS — Z95828 Presence of other vascular implants and grafts: Secondary | ICD-10-CM

## 2018-02-20 DIAGNOSIS — C792 Secondary malignant neoplasm of skin: Secondary | ICD-10-CM

## 2018-02-20 DIAGNOSIS — J40 Bronchitis, not specified as acute or chronic: Secondary | ICD-10-CM

## 2018-02-20 DIAGNOSIS — R5383 Other fatigue: Secondary | ICD-10-CM

## 2018-02-20 DIAGNOSIS — I10 Essential (primary) hypertension: Secondary | ICD-10-CM

## 2018-02-20 DIAGNOSIS — E7800 Pure hypercholesterolemia, unspecified: Secondary | ICD-10-CM

## 2018-02-20 DIAGNOSIS — C3491 Malignant neoplasm of unspecified part of right bronchus or lung: Secondary | ICD-10-CM

## 2018-02-20 DIAGNOSIS — R000 Tachycardia, unspecified: Secondary | ICD-10-CM

## 2018-02-20 DIAGNOSIS — M129 Arthropathy, unspecified: Secondary | ICD-10-CM

## 2018-02-20 DIAGNOSIS — Z923 Personal history of irradiation: Secondary | ICD-10-CM

## 2018-02-20 DIAGNOSIS — M940 Chondrocostal junction syndrome [Tietze]: Secondary | ICD-10-CM

## 2018-02-20 DIAGNOSIS — Z7189 Other specified counseling: Secondary | ICD-10-CM

## 2018-02-20 DIAGNOSIS — Z79899 Other long term (current) drug therapy: Secondary | ICD-10-CM

## 2018-02-20 DIAGNOSIS — D649 Anemia, unspecified: Secondary | ICD-10-CM

## 2018-02-20 DIAGNOSIS — Z5112 Encounter for antineoplastic immunotherapy: Secondary | ICD-10-CM | POA: Diagnosis not present

## 2018-02-20 DIAGNOSIS — C778 Secondary and unspecified malignant neoplasm of lymph nodes of multiple regions: Secondary | ICD-10-CM | POA: Diagnosis not present

## 2018-02-20 DIAGNOSIS — G893 Neoplasm related pain (acute) (chronic): Secondary | ICD-10-CM

## 2018-02-20 DIAGNOSIS — Z7982 Long term (current) use of aspirin: Secondary | ICD-10-CM

## 2018-02-20 DIAGNOSIS — Z9221 Personal history of antineoplastic chemotherapy: Secondary | ICD-10-CM

## 2018-02-20 DIAGNOSIS — Z794 Long term (current) use of insulin: Secondary | ICD-10-CM

## 2018-02-20 DIAGNOSIS — E1140 Type 2 diabetes mellitus with diabetic neuropathy, unspecified: Secondary | ICD-10-CM

## 2018-02-20 DIAGNOSIS — E039 Hypothyroidism, unspecified: Secondary | ICD-10-CM

## 2018-02-20 DIAGNOSIS — E119 Type 2 diabetes mellitus without complications: Secondary | ICD-10-CM

## 2018-02-20 DIAGNOSIS — G4700 Insomnia, unspecified: Secondary | ICD-10-CM

## 2018-02-20 LAB — CBC WITH DIFFERENTIAL/PLATELET
ABS IMMATURE GRANULOCYTES: 0.01 10*3/uL (ref 0.00–0.07)
Basophils Absolute: 0 10*3/uL (ref 0.0–0.1)
Basophils Relative: 0 %
Eosinophils Absolute: 0.4 10*3/uL (ref 0.0–0.5)
Eosinophils Relative: 8 %
HEMATOCRIT: 33.1 % — AB (ref 36.0–46.0)
HEMOGLOBIN: 10.6 g/dL — AB (ref 12.0–15.0)
Immature Granulocytes: 0 %
LYMPHS ABS: 1.2 10*3/uL (ref 0.7–4.0)
LYMPHS PCT: 23 %
MCH: 28.9 pg (ref 26.0–34.0)
MCHC: 32 g/dL (ref 30.0–36.0)
MCV: 90.2 fL (ref 80.0–100.0)
MONO ABS: 0.4 10*3/uL (ref 0.1–1.0)
MONOS PCT: 7 %
NEUTROS ABS: 3.2 10*3/uL (ref 1.7–7.7)
Neutrophils Relative %: 62 %
Platelets: 247 10*3/uL (ref 150–400)
RBC: 3.67 MIL/uL — ABNORMAL LOW (ref 3.87–5.11)
RDW: 13.2 % (ref 11.5–15.5)
WBC: 5.2 10*3/uL (ref 4.0–10.5)
nRBC: 0 % (ref 0.0–0.2)

## 2018-02-20 LAB — CMP (CANCER CENTER ONLY)
ALK PHOS: 84 U/L (ref 38–126)
ALT: 11 U/L (ref 0–44)
AST: 24 U/L (ref 15–41)
Albumin: 3.4 g/dL — ABNORMAL LOW (ref 3.5–5.0)
Anion gap: 9 (ref 5–15)
BUN: 9 mg/dL (ref 6–20)
CALCIUM: 9.4 mg/dL (ref 8.9–10.3)
CO2: 27 mmol/L (ref 22–32)
CREATININE: 0.84 mg/dL (ref 0.44–1.00)
Chloride: 105 mmol/L (ref 98–111)
Glucose, Bld: 131 mg/dL — ABNORMAL HIGH (ref 70–99)
Potassium: 3.7 mmol/L (ref 3.5–5.1)
Sodium: 141 mmol/L (ref 135–145)
Total Bilirubin: 0.3 mg/dL (ref 0.3–1.2)
Total Protein: 8 g/dL (ref 6.5–8.1)

## 2018-02-20 LAB — TSH: TSH: 18.017 u[IU]/mL — AB (ref 0.308–3.960)

## 2018-02-20 LAB — T4, FREE: FREE T4: 0.58 ng/dL — AB (ref 0.82–1.77)

## 2018-02-20 MED ORDER — SODIUM CHLORIDE 0.9 % IV SOLN
240.0000 mg | Freq: Once | INTRAVENOUS | Status: AC
  Administered 2018-02-20: 240 mg via INTRAVENOUS
  Filled 2018-02-20: qty 24

## 2018-02-20 MED ORDER — HEPARIN SOD (PORK) LOCK FLUSH 100 UNIT/ML IV SOLN
500.0000 [IU] | Freq: Once | INTRAVENOUS | Status: AC | PRN
  Administered 2018-02-20: 500 [IU]
  Filled 2018-02-20: qty 5

## 2018-02-20 MED ORDER — LEVOFLOXACIN 500 MG PO TABS: 500.0000 mg | ORAL_TABLET | Freq: Every day | ORAL | 0 refills | Status: DC

## 2018-02-20 MED ORDER — SODIUM CHLORIDE 0.9% FLUSH
10.0000 mL | INTRAVENOUS | Status: DC | PRN
  Administered 2018-02-20: 10 mL
  Filled 2018-02-20: qty 10

## 2018-02-20 MED ORDER — SODIUM CHLORIDE 0.9 % IV SOLN
Freq: Once | INTRAVENOUS | Status: AC
  Administered 2018-02-20: 14:00:00 via INTRAVENOUS
  Filled 2018-02-20: qty 250

## 2018-02-20 MED ORDER — SODIUM CHLORIDE 0.9% FLUSH
10.0000 mL | INTRAVENOUS | Status: DC | PRN
  Administered 2018-02-20: 10 mL via INTRAVENOUS
  Filled 2018-02-20: qty 10

## 2018-02-20 NOTE — Progress Notes (Signed)
Per Dr. Irene Limbo: Faythe Ghee to treat today (11/18) with abnormal thyroid test results

## 2018-02-20 NOTE — Patient Instructions (Signed)
Marilyn [name] Discharge Instructions for Patients Receiving Chemotherapy  Today you received the following chemotherapy agents nivolumab (Opdivo)  To help prevent nausea and vomiting after your treatment, we encourage you to take your nausea medication as directed   If you develop nausea and vomiting that is not controlled by your nausea medication, call the clinic.   BELOW ARE SYMPTOMS THAT SHOULD BE REPORTED IMMEDIATELY:  *FEVER GREATER THAN 100.5 F  *CHILLS WITH OR WITHOUT FEVER  NAUSEA AND VOMITING THAT IS NOT CONTROLLED WITH YOUR NAUSEA MEDICATION  *UNUSUAL SHORTNESS OF BREATH  *UNUSUAL BRUISING OR BLEEDING  TENDERNESS IN MOUTH AND THROAT WITH OR WITHOUT PRESENCE OF ULCERS  *URINARY PROBLEMS  *BOWEL PROBLEMS  UNUSUAL RASH Items with * indicate a potential emergency and should be followed up as soon as possible.  Feel free to call the clinic should you have any questions or concerns. The clinic phone number is [phone].  Please show the [place] at check-in to the Emergency Department and triage nurse.

## 2018-02-20 NOTE — Patient Instructions (Signed)
Thank you for choosing [facility] to provide your oncology and hematology care.  To afford each patient quality time with our providers, please arrive 30 minutes before your scheduled appointment time.  If you arrive late for your appointment, you may be asked to reschedule.  We strive to give you quality time with our providers, and arriving late affects you and other patients whose appointments are after yours.    If you are a no show for multiple scheduled visits, you may be dismissed from the clinic at the providers discretion.     Again, thank you for choosing [facility], our hope is that these requests will decrease the amount of time that you wait before being seen by our physicians.  ______________________________________________________________________   Should you have questions after your visit to the [facility], please contact our office at [phone] between the hours of 8:30 and 4:30 p.m.    Voicemails left after 4:30p.m will not be returned until the following business day.     For prescription refill requests, please have your pharmacy contact us directly.  Please also try to allow 48 hours for prescription requests.     Please contact the scheduling department for questions regarding scheduling.  For scheduling of procedures such as PET scans, CT scans, MRI, Ultrasound, etc please contact central scheduling at [phone].     Resources For Cancer Patients and Caregivers:    Oncolink.org:  A wonderful resource for patients and healthcare providers for information regarding your disease, ways to tract your treatment, what to expect, etc.      American Cancer Society:  800-227-2345  Can help patients locate various types of support and financial assistance   Cancer Care: 1-800-813-HOPE (4673) Provides financial assistance, online support groups, medication/co-pay assistance.     [place] DSS:  336-641-3447 Where to apply  for food stamps, Medicaid, and utility assistance   Medicare Rights Center: 800-333-4114 Helps people with Medicare understand their rights and benefits, navigate the Medicare system, and secure the quality healthcare they deserve   SCAT: 336-333-6589 [place] [employer]'s shared-ride transportation service for eligible riders who have a disability that prevents them from riding the fixed route bus.     For additional information on assistance programs please contact our social worker:   Abigail Elmore:  [phone]  

## 2018-02-20 NOTE — Progress Notes (Signed)
Per Dr Irene Limbo, ok to treat with current HR.

## 2018-02-24 IMAGING — US US ABDOMEN LIMITED
1 series · 14 of 25 positions shown · non-contrast
Comparison: CT abdomen pelvis dated 12/27/2015

CLINICAL DATA: Right upper quadrant pain

EXAM:
US ABDOMEN LIMITED - RIGHT UPPER QUADRANT

[Series 1: us abdomen limited · 0.26mm/px · 14 of 44 slices shown]
[im 1/44]
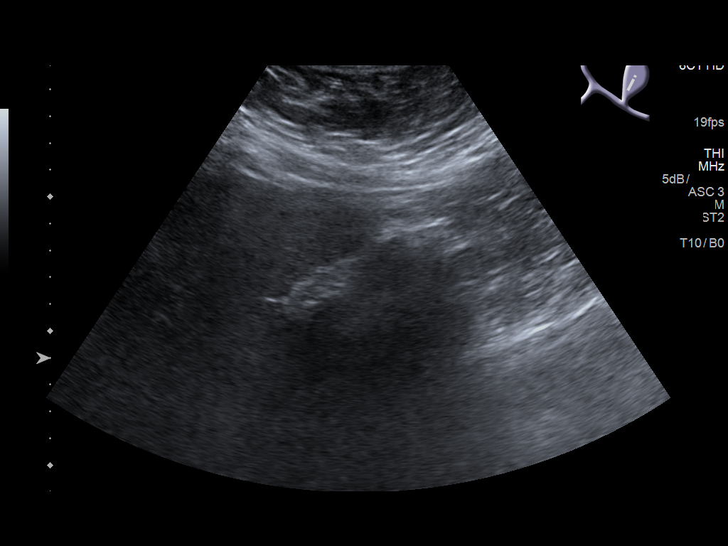
[im 4/44]
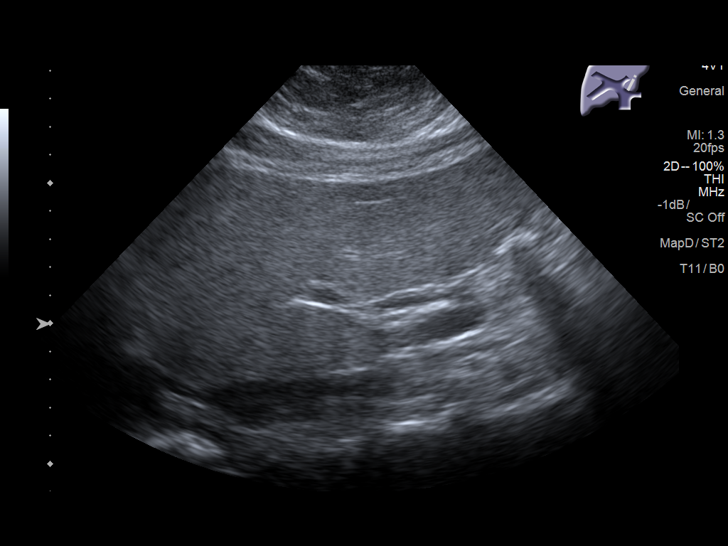
[im 8/44]
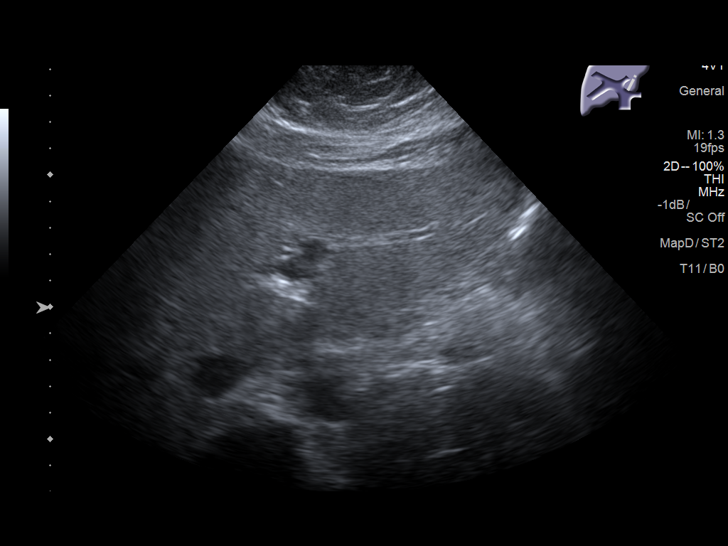
[im 11/44]
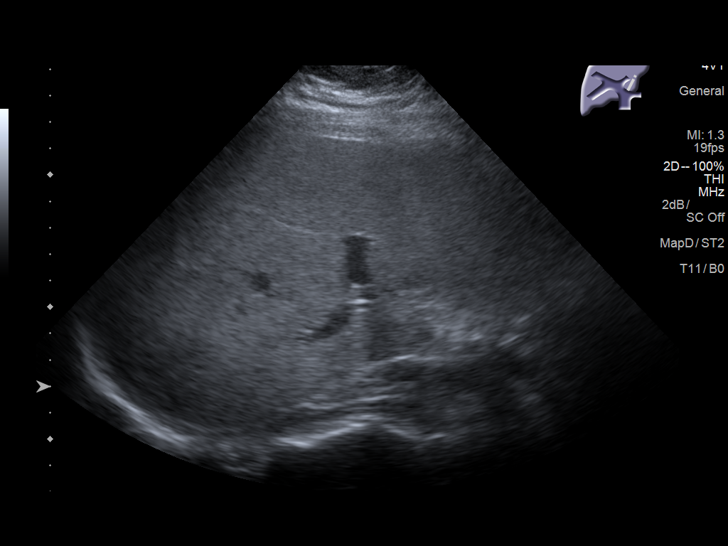
[im 15/44]
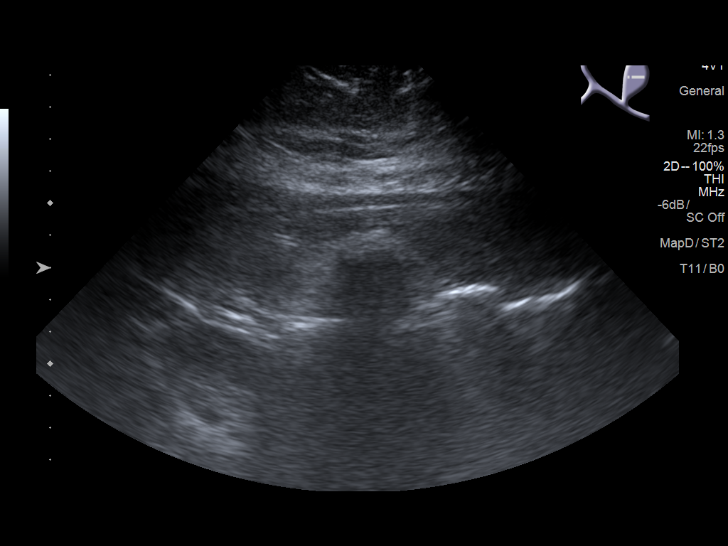
[im 17/44]
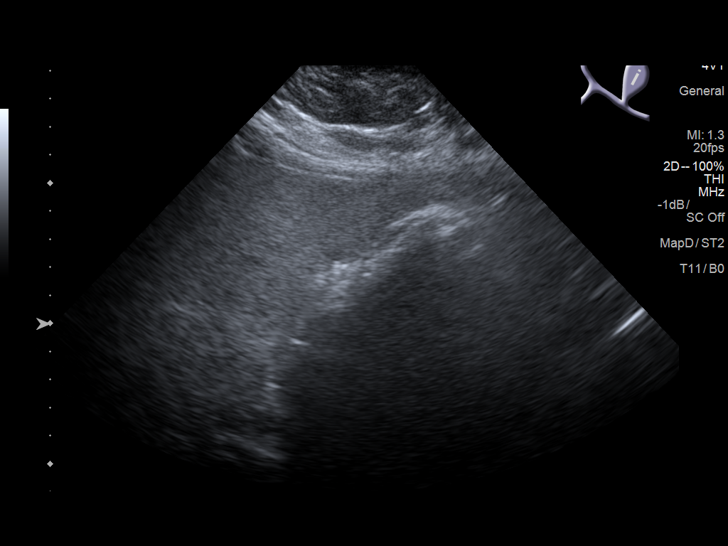
[im 20/44]
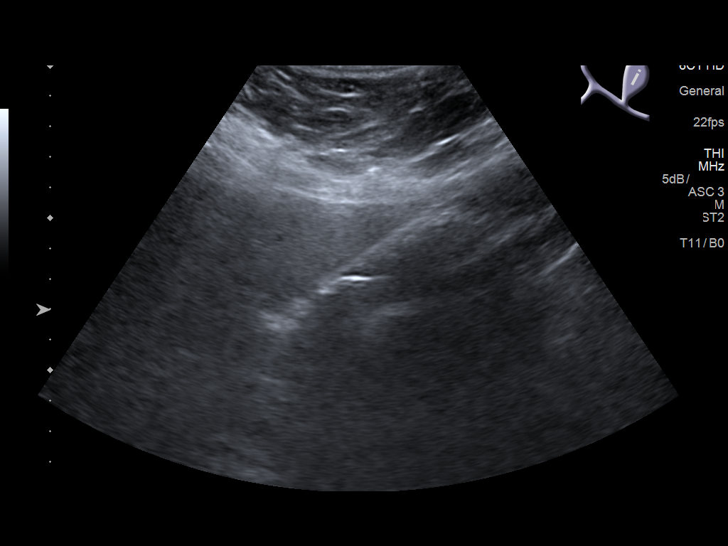
[im 24/44]
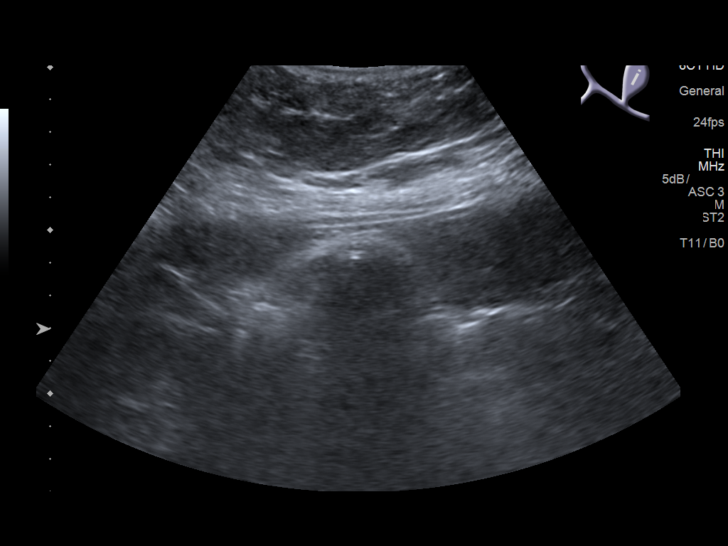
[im 27/44]
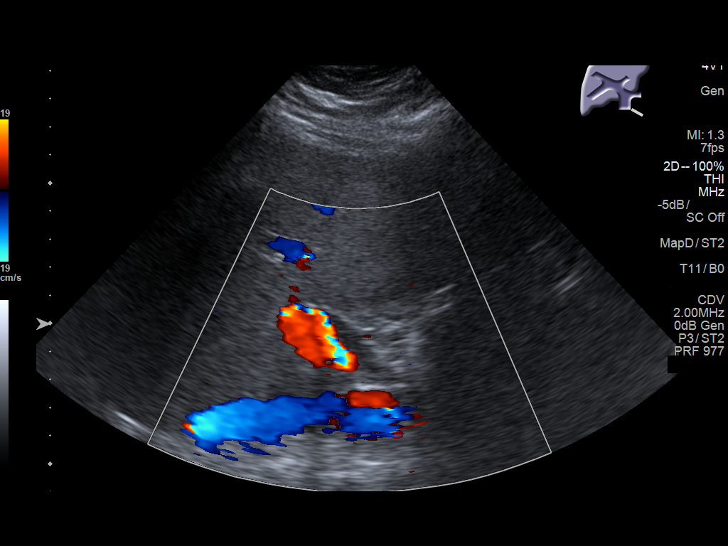
[im 29/44]
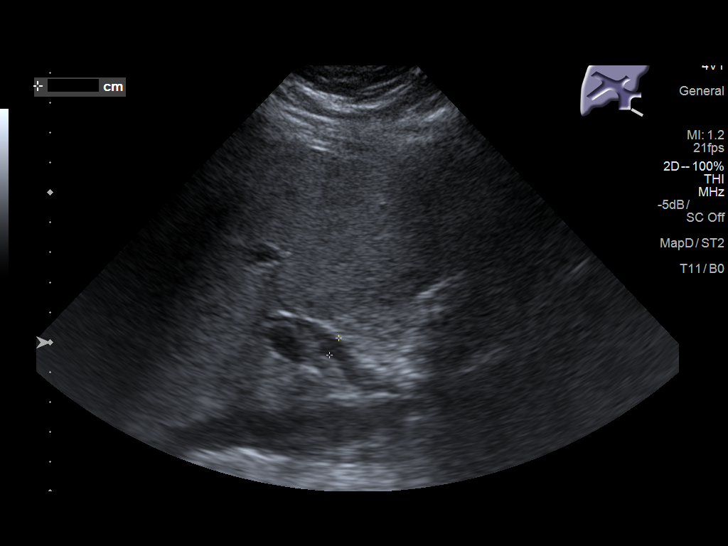
[im 33/44]
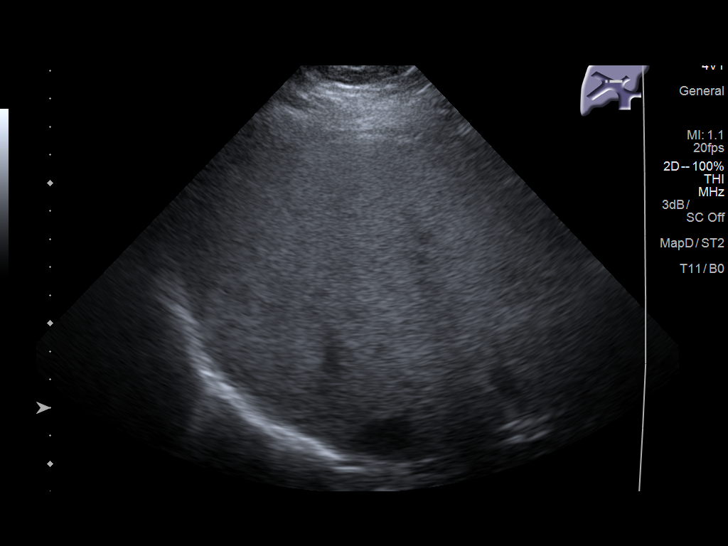
[im 36/44]
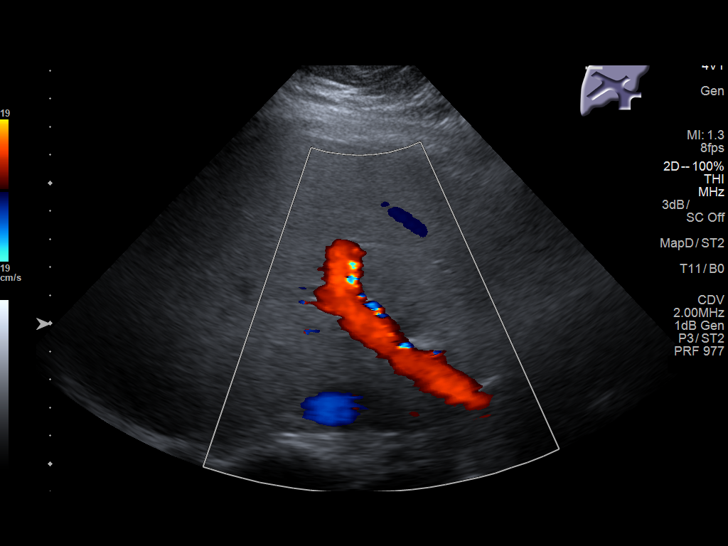
[im 40/44]
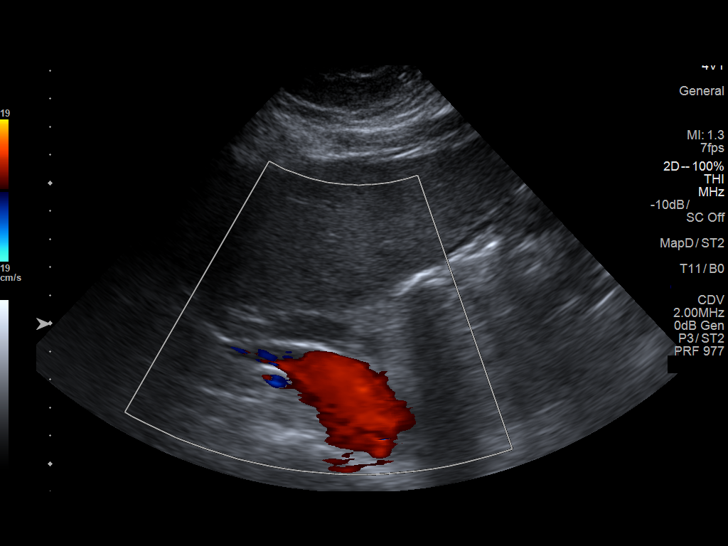
[im 44/44]
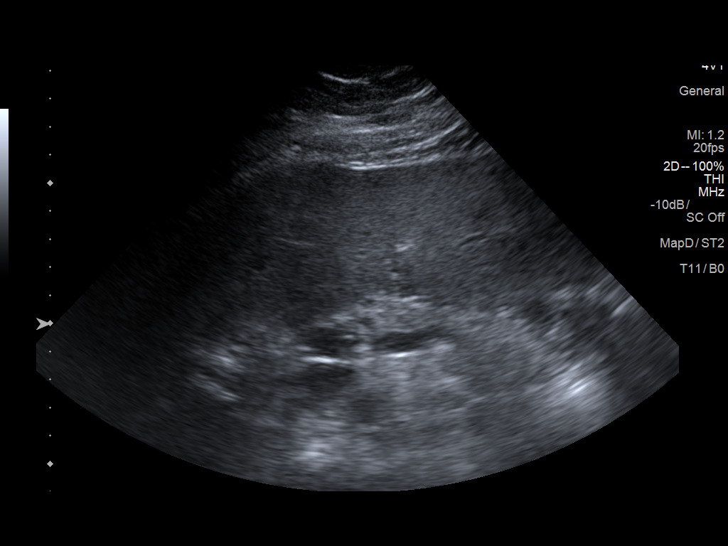

[14 of 25 positions shown; findings below may reference images not displayed]

FINDINGS: Gallbladder:

Contracted gallbladder with suspected 9 mm gallstone. No gallbladder
wall thickening or pericholecystic fluid. Negative sonographic
Murphy's sign.

Common bile duct:

Diameter: 7 mm centrally.

Liver:

Coarse hepatic echotexture with hyperechoic hepatic parenchyma. No
focal hepatic lesion is seen.
IMPRESSION: Cholelithiasis, without associated sonographic findings to suggest
acute cholecystitis.

Common duct measures 7 mm centrally, perhaps mildly prominent.
However, in the absence of abnormal LFTs, this is of dubious
clinical significance.

Coarse hepatic echotexture with hyperechoic hepatic parenchyma,
nonspecific, but raising the possibility of hepatic steatosis.

## 2018-02-25 IMAGING — US US BIOPSY
1 series · 8 of 8 positions shown · non-contrast
Comparison: none

ADDENDUM:
***Technical Error with Procedure Report Template. An un-edited
template for random liver biopsy was inadvertently submitted. The
actual procedural report should read as follows: ***
INDICATION: Newly diagnosed metastatic malignancy concerning for
primary lung cancer.

[Series 1: us biopsy · 0.07mm/px · 8 of 8 slices shown]
[im 1/8]
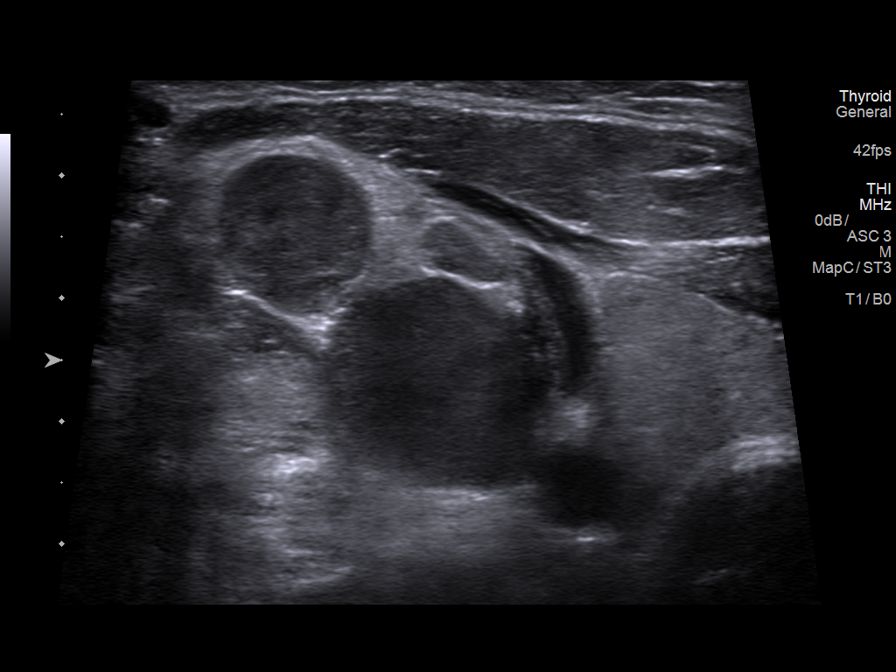
[im 2/8]
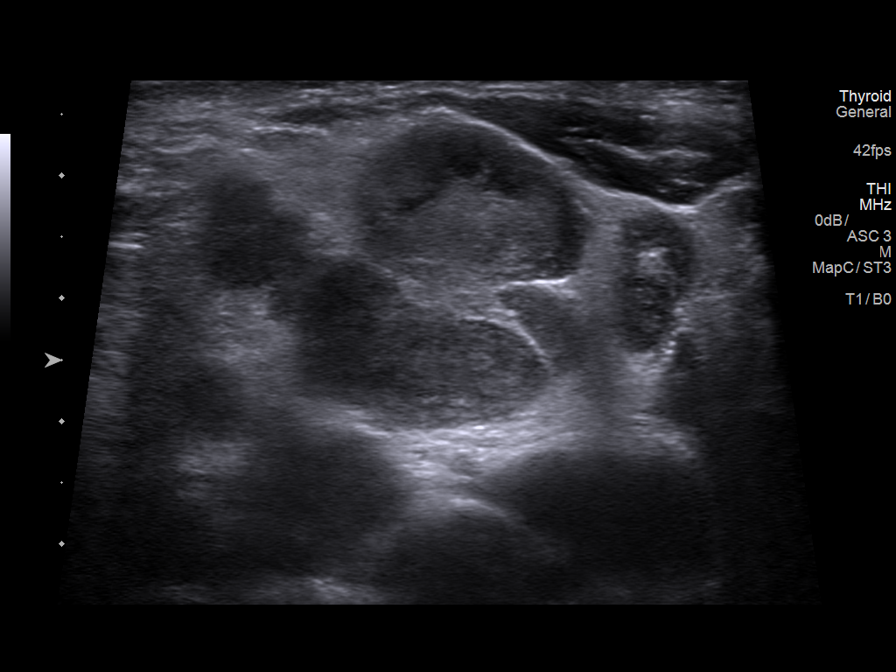
[im 3/8]
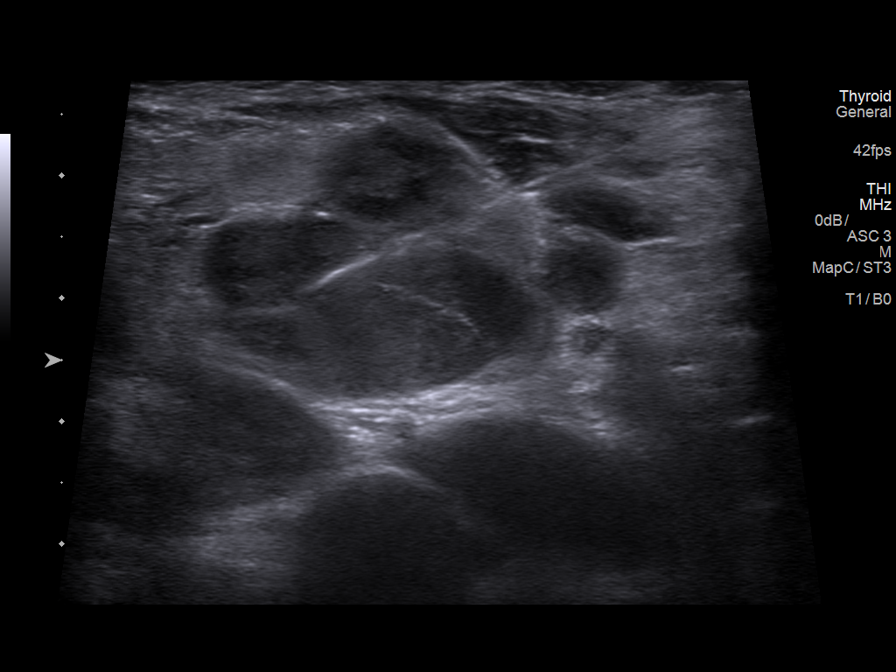
[im 4/8]
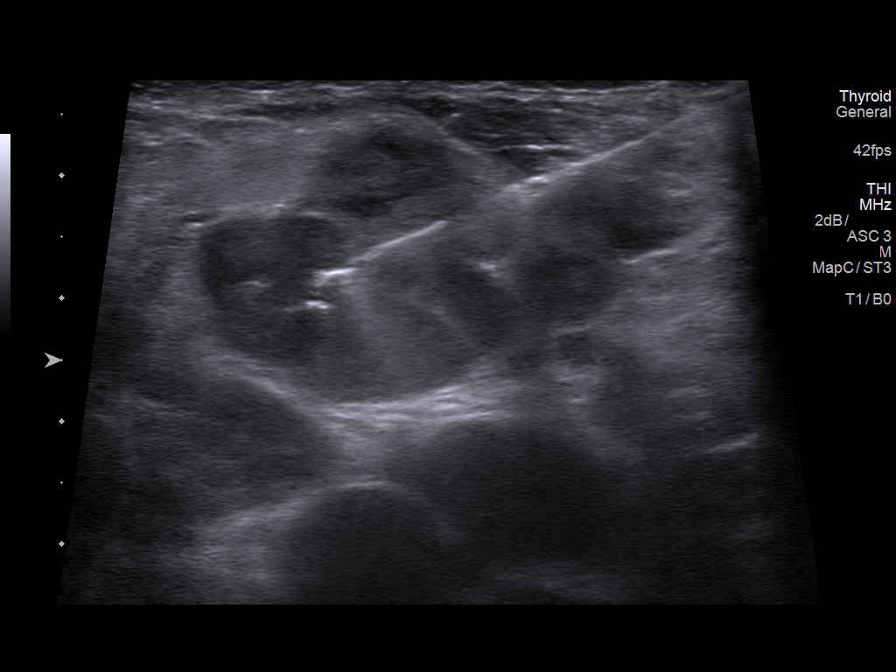
[im 5/8]
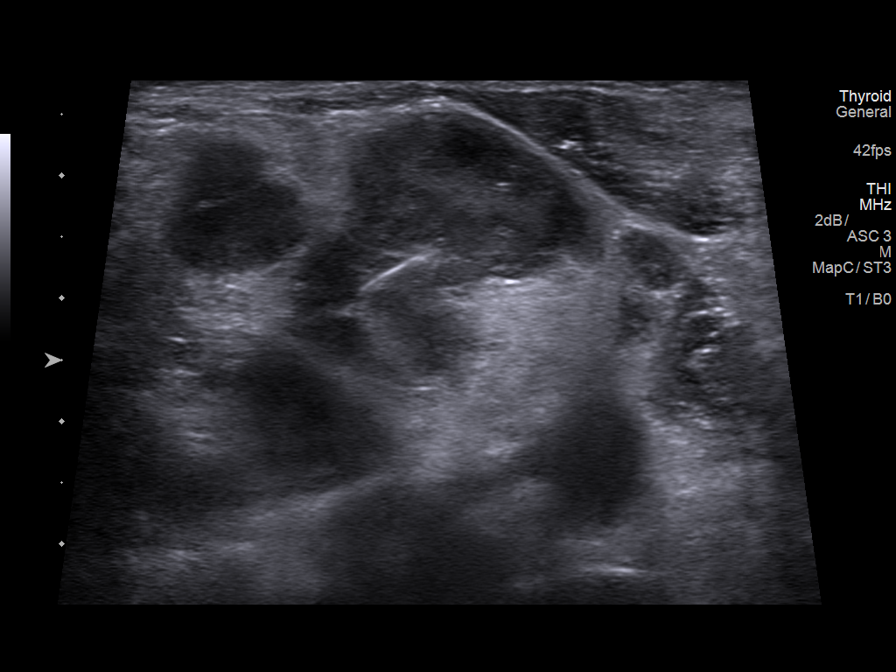
[im 6/8]
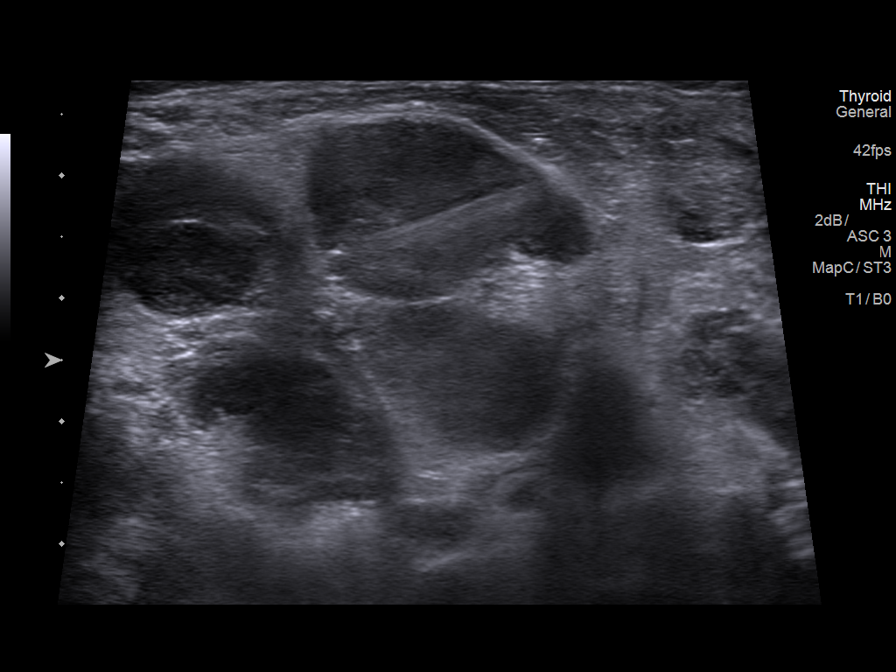
[im 7/8]
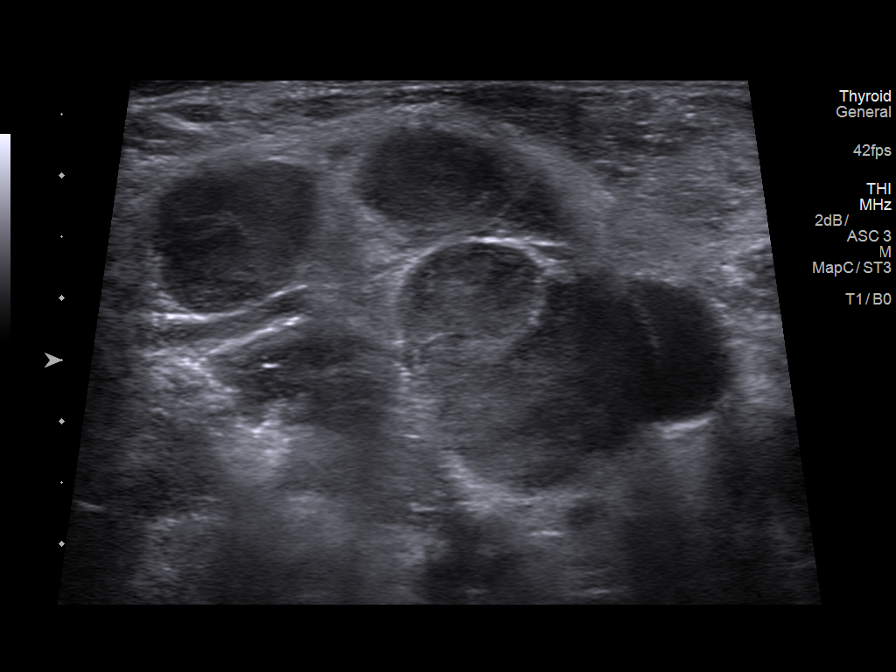
[im 8/8]
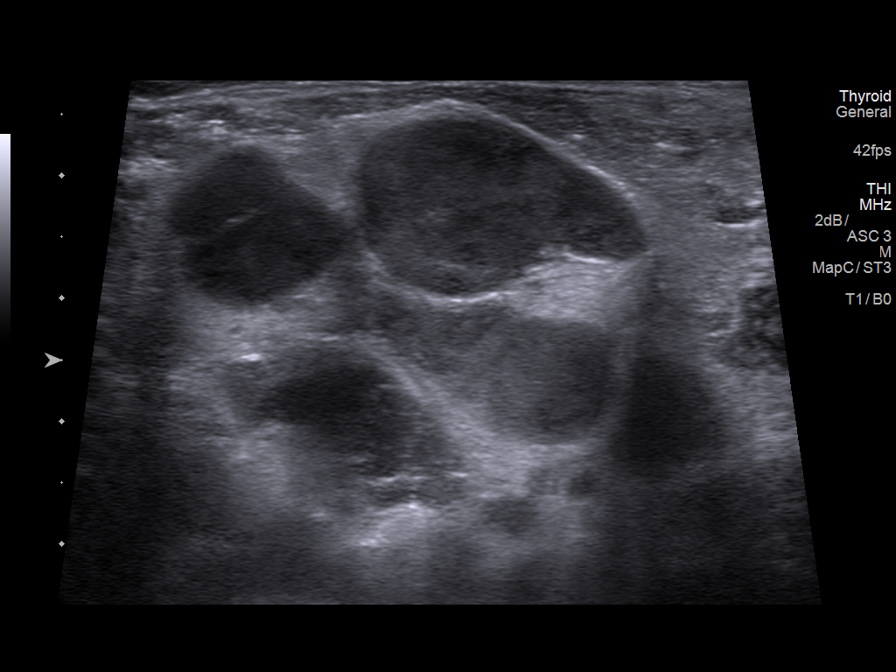

[8 of 8 positions shown; findings below may reference images not displayed]

EXAM:
Ultrasound-guided core biopsy

MEDICATIONS:
None.

ANESTHESIA/SEDATION:
2 mg Versed and 100 mcg fentanyl were administered intravenously.

Moderate Sedation Time:  15 minutes

The patient was continuously monitored during the procedure by the
interventional radiology nurse under my direct supervision.

FLUOROSCOPY TIME: Fluoroscopy Time:  0 minutes 0 seconds (0 mGy).

COMPLICATIONS:
None immediate.

PROCEDURE:
Informed consent was obtained from the patient following explanation
of the procedure, risks, benefits and alternatives. The patient
understands, agrees and consents for the procedure. All questions
were addressed. A time out was performed.

The right supraclavicular fossa was interrogated with ultrasound.
Multiple prominent lymph nodes were successfully identified. A
suitable target was selected. The skin was sterilely prepped and
draped in the standard fashion using chlorhexidine skin prep. Under
real-time sonographic guidance, multiple 18 gauge core biopsies were
then coaxially obtained. Needle placement was confirmed on all
biopsy passes with real-time sonography. Biopsy specimens were
placed in saline and delivered to pathology for further analysis.

Post biopsy ultrasound imaging demonstrates no active bleeding or
complication. The patient tolerated the procedure well.
IMPRESSION: Technically successful ultrasound-guided random core biopsy of right
supraclavicular lymph node.

EXAM:
Ultrasound-guided core biopsy of the liver

MEDICATIONS:
None.

ANESTHESIA/SEDATION:
Fentanyl  mcg IV; Versed  mg IV

Moderate Sedation Time:

The patient was continuously monitored during the procedure by the
interventional radiology nurse under my direct supervision.

FLUOROSCOPY TIME:  Fluoroscopy Time:  minutes  seconds ( mGy).

COMPLICATIONS:
None immediate.

PROCEDURE:
Informed consent was obtained from the patient following explanation
of the procedure, risks, benefits and alternatives. The patient
understands, agrees and consents for the procedure. All questions
were addressed. A time out was performed.

The right upper quadrant was interrogated with ultrasound. A
relatively avascular plane of the liver was identified. A suitable
skin entry site was selected and marked. The region was then
sterilely prepped and draped in standard fashion using chlorhexidine
skin prep. Local anesthesia was attained by infiltration with 1%
lidocaine. A small dermatotomy was made.

Under real-time sonographic guidance, a 17 gauge trocar needle was
advanced into the liver. Multiple 18 gauge core biopsies were then
coaxially obtained. Needle placement was confirmed on all biopsy
passes with real-time sonography. Biopsy specimens were placed in
formalin and delivered to pathology for further analysis.

As the introducer needle was removed, the biopsy tract was embolized
with a Gel-Foam slurry. Post biopsy ultrasound imaging demonstrates
no active bleeding or perihepatic hematoma. The patient tolerated
the procedure well.
IMPRESSION: Technically successful ultrasound-guided random core biopsy of the
liver.

## 2018-03-01 ENCOUNTER — Ambulatory Visit (HOSPITAL_COMMUNITY)
Admission: RE | Admit: 2018-03-01 | Discharge: 2018-03-01 | Disposition: A | Discharge status: Home / Self Care | Payer: Medicaid Other | Source: Ambulatory Visit | Attending: Hematology | Admitting: Hematology

## 2018-03-01 DIAGNOSIS — C3491 Malignant neoplasm of unspecified part of right bronchus or lung: Secondary | ICD-10-CM | POA: Insufficient documentation

## 2018-03-01 DIAGNOSIS — E039 Hypothyroidism, unspecified: Secondary | ICD-10-CM | POA: Insufficient documentation

## 2018-03-01 DIAGNOSIS — J40 Bronchitis, not specified as acute or chronic: Secondary | ICD-10-CM | POA: Insufficient documentation

## 2018-03-01 MED ORDER — SODIUM CHLORIDE (PF) 0.9 % IJ SOLN
INTRAMUSCULAR | Status: AC
  Filled 2018-03-01: qty 50

## 2018-03-01 MED ORDER — IOHEXOL 300 MG/ML  SOLN
100.0000 mL | Freq: Once | INTRAMUSCULAR | Status: AC | PRN
  Administered 2018-03-01: 100 mL via INTRAVENOUS

## 2018-03-01 MED ORDER — HEPARIN SOD (PORK) LOCK FLUSH 100 UNIT/ML IV SOLN
INTRAVENOUS | Status: AC
  Filled 2018-03-01: qty 5

## 2018-03-01 MED ORDER — HEPARIN SOD (PORK) LOCK FLUSH 100 UNIT/ML IV SOLN
500.0000 [IU] | Freq: Once | INTRAVENOUS | Status: AC
  Administered 2018-03-01: 500 [IU] via INTRAVENOUS

## 2018-03-06 ENCOUNTER — Inpatient Hospital Stay: Payer: Medicaid Other | Attending: Hematology | Admitting: Hematology

## 2018-03-06 ENCOUNTER — Inpatient Hospital Stay: Payer: Medicaid Other

## 2018-03-06 VITALS — BP 143/90 | HR 128 | Temp 98.5°F | Resp 18 | Ht 69.0 in | Wt 280.7 lb

## 2018-03-06 DIAGNOSIS — E1140 Type 2 diabetes mellitus with diabetic neuropathy, unspecified: Secondary | ICD-10-CM | POA: Insufficient documentation

## 2018-03-06 DIAGNOSIS — I428 Other cardiomyopathies: Secondary | ICD-10-CM | POA: Insufficient documentation

## 2018-03-06 DIAGNOSIS — Z95828 Presence of other vascular implants and grafts: Secondary | ICD-10-CM

## 2018-03-06 DIAGNOSIS — M129 Arthropathy, unspecified: Secondary | ICD-10-CM | POA: Diagnosis not present

## 2018-03-06 DIAGNOSIS — E669 Obesity, unspecified: Secondary | ICD-10-CM | POA: Insufficient documentation

## 2018-03-06 DIAGNOSIS — Z7189 Other specified counseling: Secondary | ICD-10-CM

## 2018-03-06 DIAGNOSIS — C3491 Malignant neoplasm of unspecified part of right bronchus or lung: Secondary | ICD-10-CM | POA: Diagnosis not present

## 2018-03-06 DIAGNOSIS — M940 Chondrocostal junction syndrome [Tietze]: Secondary | ICD-10-CM | POA: Insufficient documentation

## 2018-03-06 DIAGNOSIS — G629 Polyneuropathy, unspecified: Secondary | ICD-10-CM | POA: Diagnosis not present

## 2018-03-06 DIAGNOSIS — M25511 Pain in right shoulder: Secondary | ICD-10-CM | POA: Diagnosis not present

## 2018-03-06 DIAGNOSIS — Z7982 Long term (current) use of aspirin: Secondary | ICD-10-CM | POA: Diagnosis not present

## 2018-03-06 DIAGNOSIS — E039 Hypothyroidism, unspecified: Secondary | ICD-10-CM | POA: Insufficient documentation

## 2018-03-06 DIAGNOSIS — K8020 Calculus of gallbladder without cholecystitis without obstruction: Secondary | ICD-10-CM | POA: Diagnosis not present

## 2018-03-06 DIAGNOSIS — I700 Atherosclerosis of aorta: Secondary | ICD-10-CM | POA: Insufficient documentation

## 2018-03-06 DIAGNOSIS — G893 Neoplasm related pain (acute) (chronic): Secondary | ICD-10-CM | POA: Insufficient documentation

## 2018-03-06 DIAGNOSIS — Z79899 Other long term (current) drug therapy: Secondary | ICD-10-CM

## 2018-03-06 DIAGNOSIS — C778 Secondary and unspecified malignant neoplasm of lymph nodes of multiple regions: Secondary | ICD-10-CM

## 2018-03-06 DIAGNOSIS — C792 Secondary malignant neoplasm of skin: Secondary | ICD-10-CM | POA: Diagnosis not present

## 2018-03-06 DIAGNOSIS — E7800 Pure hypercholesterolemia, unspecified: Secondary | ICD-10-CM | POA: Insufficient documentation

## 2018-03-06 DIAGNOSIS — K208 Other esophagitis: Secondary | ICD-10-CM

## 2018-03-06 DIAGNOSIS — E1165 Type 2 diabetes mellitus with hyperglycemia: Secondary | ICD-10-CM | POA: Diagnosis not present

## 2018-03-06 DIAGNOSIS — J40 Bronchitis, not specified as acute or chronic: Secondary | ICD-10-CM

## 2018-03-06 DIAGNOSIS — J45909 Unspecified asthma, uncomplicated: Secondary | ICD-10-CM | POA: Insufficient documentation

## 2018-03-06 DIAGNOSIS — Z5112 Encounter for antineoplastic immunotherapy: Secondary | ICD-10-CM

## 2018-03-06 DIAGNOSIS — R000 Tachycardia, unspecified: Secondary | ICD-10-CM | POA: Insufficient documentation

## 2018-03-06 LAB — CBC WITH DIFFERENTIAL/PLATELET
ABS IMMATURE GRANULOCYTES: 0.02 10*3/uL (ref 0.00–0.07)
BASOS PCT: 0 %
Basophils Absolute: 0 10*3/uL (ref 0.0–0.1)
Eosinophils Absolute: 0.5 10*3/uL (ref 0.0–0.5)
Eosinophils Relative: 7 %
HCT: 30.2 % — ABNORMAL LOW (ref 36.0–46.0)
Hemoglobin: 9.8 g/dL — ABNORMAL LOW (ref 12.0–15.0)
Immature Granulocytes: 0 %
Lymphocytes Relative: 16 %
Lymphs Abs: 1 10*3/uL (ref 0.7–4.0)
MCH: 29.3 pg (ref 26.0–34.0)
MCHC: 32.5 g/dL (ref 30.0–36.0)
MCV: 90.1 fL (ref 80.0–100.0)
Monocytes Absolute: 0.4 10*3/uL (ref 0.1–1.0)
Monocytes Relative: 6 %
NEUTROS ABS: 4.7 10*3/uL (ref 1.7–7.7)
Neutrophils Relative %: 71 %
PLATELETS: 260 10*3/uL (ref 150–400)
RBC: 3.35 MIL/uL — AB (ref 3.87–5.11)
RDW: 13.4 % (ref 11.5–15.5)
WBC: 6.6 10*3/uL (ref 4.0–10.5)
nRBC: 0 % (ref 0.0–0.2)

## 2018-03-06 LAB — CMP (CANCER CENTER ONLY)
ALBUMIN: 3.2 g/dL — AB (ref 3.5–5.0)
ALT: 11 U/L (ref 0–44)
AST: 21 U/L (ref 15–41)
Alkaline Phosphatase: 100 U/L (ref 38–126)
Anion gap: 11 (ref 5–15)
BILIRUBIN TOTAL: 0.3 mg/dL (ref 0.3–1.2)
BUN: 9 mg/dL (ref 6–20)
CHLORIDE: 105 mmol/L (ref 98–111)
CO2: 26 mmol/L (ref 22–32)
Calcium: 9.2 mg/dL (ref 8.9–10.3)
Creatinine: 0.82 mg/dL (ref 0.44–1.00)
GFR, Est AFR Am: >60 mL/min (ref >60)
GLUCOSE: 179 mg/dL — AB (ref 70–99)
Potassium: 3.8 mmol/L (ref 3.5–5.1)
Sodium: 142 mmol/L (ref 135–145)
TOTAL PROTEIN: 7.7 g/dL (ref 6.5–8.1)

## 2018-03-06 MED ORDER — VITAMIN A & D EX OINT: 1.0000 "application " | TOPICAL_OINTMENT | Freq: Two times a day (BID) | CUTANEOUS | 3 refills | Status: AC

## 2018-03-06 MED ORDER — SODIUM CHLORIDE 0.9 % IJ SOLN: 10.0000 mL | INTRAMUSCULAR | Status: DC | PRN

## 2018-03-06 MED ORDER — FLUCONAZOLE 200 MG PO TABS: 200.0000 mg | ORAL_TABLET | Freq: Every day | ORAL | 0 refills | Status: DC

## 2018-03-06 MED ORDER — SODIUM CHLORIDE 0.9 % IV SOLN
Freq: Once | INTRAVENOUS | Status: AC
  Administered 2018-03-06: 15:00:00 via INTRAVENOUS
  Filled 2018-03-06: qty 250

## 2018-03-06 MED ORDER — SODIUM CHLORIDE 0.9% FLUSH
10.0000 mL | INTRAVENOUS | Status: DC | PRN
  Administered 2018-03-06: 10 mL via INTRAVENOUS
  Filled 2018-03-06: qty 10

## 2018-03-06 MED ORDER — OXYCODONE-ACETAMINOPHEN 5-325 MG PO TABS: 1.0000 | ORAL_TABLET | Freq: Four times a day (QID) | ORAL | 0 refills | Status: DC | PRN

## 2018-03-06 MED ORDER — SODIUM CHLORIDE 0.9% FLUSH
10.0000 mL | INTRAVENOUS | Status: DC | PRN
  Administered 2018-03-06: 10 mL
  Filled 2018-03-06: qty 10

## 2018-03-06 MED ORDER — LEVOFLOXACIN 500 MG PO TABS: 500.0000 mg | ORAL_TABLET | Freq: Every day | ORAL | 0 refills | Status: DC

## 2018-03-06 MED ORDER — HYDROXYZINE HCL 10 MG PO TABS: 10.0000 mg | ORAL_TABLET | Freq: Three times a day (TID) | ORAL | 0 refills | Status: DC | PRN

## 2018-03-06 MED ORDER — HEPARIN SOD (PORK) LOCK FLUSH 100 UNIT/ML IV SOLN
500.0000 [IU] | Freq: Once | INTRAVENOUS | Status: AC | PRN
  Administered 2018-03-06: 500 [IU]
  Filled 2018-03-06: qty 5

## 2018-03-06 MED ORDER — SODIUM CHLORIDE 0.9 % IV SOLN
240.0000 mg | Freq: Once | INTRAVENOUS | Status: AC
  Administered 2018-03-06: 240 mg via INTRAVENOUS
  Filled 2018-03-06: qty 24

## 2018-03-06 NOTE — Patient Instructions (Signed)
[facility] Discharge Instructions for Patients Receiving Chemotherapy  Today you received the following chemotherapy agents nivolumab (Opdivo)  To help prevent nausea and vomiting after your treatment, we encourage you to take your nausea medication as directed   If you develop nausea and vomiting that is not controlled by your nausea medication, call the clinic.   BELOW ARE SYMPTOMS THAT SHOULD BE REPORTED IMMEDIATELY:  *FEVER GREATER THAN 100.5 F  *CHILLS WITH OR WITHOUT FEVER  NAUSEA AND VOMITING THAT IS NOT CONTROLLED WITH YOUR NAUSEA MEDICATION  *UNUSUAL SHORTNESS OF BREATH  *UNUSUAL BRUISING OR BLEEDING  TENDERNESS IN MOUTH AND THROAT WITH OR WITHOUT PRESENCE OF ULCERS  *URINARY PROBLEMS  *BOWEL PROBLEMS  UNUSUAL RASH Items with * indicate a potential emergency and should be followed up as soon as possible.  Feel free to call the clinic should you have any questions or concerns. The clinic phone number is [phone].  Please show the [facility] at check-in to the Emergency Department and triage nurse.

## 2018-03-06 NOTE — Progress Notes (Signed)
Per Dr. Irene Limbo - ok to treat with elevated heart rate

## 2018-03-06 NOTE — Progress Notes (Signed)
[name]    HEMATOLOGY/ONCOLOGY CLINIC NOTE  Date of Service: 03/06/18     Patient Care Team: [name], FNP as PCP - General (Nurse Practitioner) Pixie Casino, MD as PCP - Cardiology (Cardiology)  CHIEF COMPLAINTS  F/u for continued management of metastatic lung adenocarcinoma  Diagnosis Metastatic Lung Adenocarcinoma with rt flank subcutaneous metastasis.  Current treatment:  Nivolumab q2 weeks Palliative RT to pain rt inguinal mass   PreviousTreatment -Concurrent Chemo-radiation with Carboplatin/Taxol. Completed RT on 02/13/2016 and has then completed 2 cycles of carboplatin + taxol. -Started Maintenance Durvalumab from  05/24/2016 q2weeks, switched to Nivolumab q2weeks on 12/13/16 once metastatic disease noted -s/p palliative RT to rt sided Subcutaneous metastatic mass.    HISTORY OF PRESENTING ILLNESS: Plz see my previous note for details on initial presentation.  INTERVAL HISTORY   Marilyn [name] presents to the office today for followup of her metastatic lung cancer. The patient's last visit with [place] was on 02/20/18. The pt reports that she is doing well overall.   The pt reports that she has been having some intermittent pain in her right shoulder which is both below her armpit, and in the top of her shoulder. The pt also endorses associated restriction in the range of movement of her right arm.  The pt has been using a cream for her vaginal itching but notes that her vaginal itching has continued. She notes that she has had some uncontrolled urination when she coughs, and has not been able to keep the area dry.   The pt notes that she took Levofloxacin for only one day before misplacing the medication. She notes that her cough continues but she does not have any fevers.   Of note since the patient's last visit, pt has had a CT C/A/P completed on 03/01/18 with results revealing Bulky right subpectoral/axillary adenopathy, markedly progressive from 11/11/2017.  Enlarging retrocrural lymph node. Low right paratracheal adenopathy is stable. 2. Interval resection of a right inguinal nodal mass. Stable right external iliac lymph node. 3. Cholelithiasis. Stable common bile duct dilatation. 4. Aortic atherosclerosis.  Lab results today (03/06/18) of CBC w/diff and CMP is as follows: all values are WNL except for RBC at 3.35, HGB at 9.8, HCT at 30.2, Glucose at 179, Albumin at 3.2.  On review of systems, pt reports some right shoulder pain, decreased ROM of right shoulder, cough, vaginal itchiness, stable energy levels, and denies fevers, chills, night sweats, difficulty breathing, leg swelling, and any other symptoms.    MEDICAL HISTORY:  Past Medical History:  Diagnosis Date  . Arthritis   . Asthma   . Diabetes mellitus    Type II  . Hemorrhoids   . Hypercholesteremia   . Hypertension   . Hypothyroidism   . Metastatic lung cancer (metastasis from lung to other site) (HCC)    Lung, Mets to skin- right flank. CT angio 2019 suspicious for axillary, external iliac, right inguinal mets  . 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 NICM (nonischemic cardiomyopathy) ([place])    a. mild - EF 45-50% by echo 10/2016 but EF 52% by low risk nuc 11/2016. b. Echo 07/2017 - EF normal, mild AI, unremarkable echo otherwise.  . Obese   . Pneumonia 2017  . Sinus tachycardia    a. dates back to at least 2013, etiology unclear.    SURGICAL HISTORY: Past Surgical History:  Procedure Laterality Date  . CESAREAN SECTION    . COLONOSCOPY    .  I/D  arm Right 2013  . IR FLUORO GUIDE PORT INSERTION RIGHT  02/18/2017  . IR GENERIC HISTORICAL  01/08/2016   IR US GUIDE VASC ACCESS RIGHT 01/08/2016 [name], DO [facility]  . IR GENERIC HISTORICAL  01/08/2016   IR FLUORO GUIDE CV LINE RIGHT 01/08/2016 [name], DO [facility]  . IR US GUIDE VASC ACCESS RIGHT  02/18/2017  . MULTIPLE EXTRACTIONS WITH ALVEOLOPLASTY N/A 03/25/2017   Procedure: MULTIPLE  EXTRACTION WITH ALVEOLOPLASTY (teeth #s four, six, seven, eight, nine, ten, eleven, one, twenty-three, twenty-four, twenty-five, twenty-six, twenty-nine, thirty);  Surgeon: [name], [name];  Location: [facility];  Service: Oral Surgery;  Laterality: N/A;  . TUBAL LIGATION    . US guided core needle biopsy     of right lower neck/supraclavicular lymph nodes    SOCIAL HISTORY: Social History   Socioeconomic History  . Marital status: Divorced    Spouse name: Not on file  . Number of children: Not on file  . Years of education: Not on file  . Highest education level: Not on file  Occupational History  . Not on file  Social Needs  . Financial resource strain: Not on file  . Food insecurity:    Worry: Not on file    Inability: Not on file  . Transportation needs:    Medical: No    Non-medical: No  Tobacco Use  . Smoking status: Never Smoker  . Smokeless tobacco: Never Used  Substance and Sexual Activity  . Alcohol use: No  . Drug use: No  . Sexual activity: Not Currently  Lifestyle  . Physical activity:    Days per week: Not on file    Minutes per session: Not on file  . Stress: Not on file  Relationships  . Social connections:    Talks on phone: Not on file    Gets together: Not on file    Attends religious service: Not on file    Active member of club or organization: Not on file    Attends meetings of clubs or organizations: Not on file    Relationship status: Not on file  . Intimate partner violence:    Fear of current or ex partner: No    Emotionally abused: No    Physically abused: No    Forced sexual activity: No  Other Topics Concern  . Not on file  Social History Narrative   No bird or mold exposure. No recent travel.   11-23-17 Unable to ask abuse questions mother with her today.    FAMILY HISTORY: Family History  Problem Relation Age of Onset  . Heart failure Mother   . Hypertension Mother   . Cancer Neg Hx   . Rheumatologic disease Neg Hx      ALLERGIES:  has No Known Allergies.  MEDICATIONS:  Current Outpatient Medications  Medication Sig Dispense Refill  . aspirin EC 81 MG tablet Take 1 tablet (81 mg total) by mouth daily. 60 tablet 2  . DULoxetine (CYMBALTA) 60 MG capsule TAKE 1 CAPSULE BY MOUTH EVERY DAY 30 capsule 2  . fluconazole (DIFLUCAN) 200 MG tablet Take 1 tablet (200 mg total) by mouth daily. 1 tablet 0  . hydrOXYzine (ATARAX/VISTARIL) 10 MG tablet Take 1 tablet (10 mg total) by mouth 3 (three) times daily as needed for itching. 30 tablet 0  . insulin aspart (NOVOLOG) 100 UNIT/ML FlexPen As per sliding scale instructions (Patient taking differently: Inject 0-10 Units into the skin 3 (three) times  daily. As per sliding scale instructions) 15 mL 2  . Insulin Degludec (TRESIBA FLEXTOUCH) 200 UNIT/ML SOPN Inject 100 Units into the skin daily before breakfast.     . levofloxacin (LEVAQUIN) 500 MG tablet Take 1 tablet (500 mg total) by mouth daily. 5 tablet 0  . levothyroxine (SYNTHROID, LEVOTHROID) 150 MCG tablet Take 1 tablet (150 mcg total) by mouth daily before breakfast. 30 tablet 2  . lidocaine-prilocaine (EMLA) cream Apply 1 application topically as needed. 30 g 6  . metoprolol succinate (TOPROL-XL) 50 MG 24 hr tablet Take 1 tablet ('50mg'$ ) by mouth in the morning and 1/2 tablet ('25mg'$ ) in the evening. Take with or immediately following a meal. 135 tablet 3  . naproxen (NAPROSYN) 500 MG tablet Take 1 tablet (500 mg total) by mouth 2 (two) times daily with a meal. 20 tablet 0  . nystatin cream (MYCOSTATIN) Apply 1 application topically 2 (two) times daily.    [name] oxyCODONE-acetaminophen (PERCOCET) 5-325 MG tablet Take 1 tablet by mouth every 6 (six) hours as needed for moderate pain or severe pain. 50 tablet 0  . pregabalin (LYRICA) 50 MG capsule Take 50 mg by mouth 3 (three) times daily.    [name] senna-docusate (SENNA S) 8.6-50 MG tablet Take 2 tablets by mouth 2 (two) times daily. May reduce to 2 tab po HS as needed once  regular BM estabished (Patient taking differently: Take 2 tablets by mouth 2 (two) times daily as needed (FOR CONSTIPATION). =) 60 tablet 1  . traZODone (DESYREL) 50 MG tablet Take 1-2 tablets (50-100 mg total) by mouth at bedtime as needed for sleep. 60 tablet 0   No current facility-administered medications for this visit.    Facility-Administered Medications Ordered in Other Visits  Medication Dose Route Frequency Provider Last Rate Last Dose  . sodium chloride 0.9 % injection 10 mL  10 mL Intravenous PRN [name], MD   10 mL at 05/24/16 1252    REVIEW OF SYSTEMS:    A 10+ POINT REVIEW OF SYSTEMS WAS OBTAINED including neurology, dermatology, psychiatry, cardiac, respiratory, lymph, extremities, GI, GU, Musculoskeletal, constitutional, breasts, reproductive, HEENT.  All pertinent positives are noted in the HPI.  All others are negative.   PHYSICAL EXAMINATION: ECOG PERFORMANCE STATUS: 1 - Symptomatic but completely ambulatory  Vitals:   03/06/18 1319  BP: (!) 143/90  Pulse: (!) 128  Resp: 18  Temp: 98.5 F (36.9 C)  SpO2: 96%   Filed Weights   03/06/18 1319  Weight: 280 lb 11.2 oz (127.3 kg)   .Body mass index is 41.45 kg/m.  GENERAL:alert, in no acute distress and comfortable SKIN: no acute rashes, no significant lesions EYES: conjunctiva are pink and non-injected, sclera anicteric OROPHARYNX: MMM, no exudates, no oropharyngeal erythema or ulceration NECK: supple, no JVD LYMPH:  no palpable lymphadenopathy in the cervical, axillary or inguinal regions LUNGS: clear to auscultation b/l with normal respiratory effort HEART: regular rate & rhythm ABDOMEN:  normoactive bowel sounds , non tender, not distended. No palpable hepatosplenomegaly.  Extremity: no pedal edema PSYCH: alert & oriented x 3 with fluent speech NEURO: no focal motor/sensory deficits   LABORATORY DATA: .  [name] CBC Latest Ref Rng & Units 03/06/2018 02/20/2018 02/06/2018  WBC 4.0 - 10.5 K/uL  6.6 5.2 5.5  Hemoglobin 12.0 - 15.0 g/dL 9.8(L) 10.6(L) 9.6(L)  Hematocrit 36.0 - 46.0 % 30.2(L) 33.1(L) 30.4(L)  Platelets 150 - 400 K/uL 260 247 253    . CMP Latest Ref Rng & Units  03/06/2018 02/20/2018 02/06/2018  Glucose 70 - 99 mg/dL 179(H) 131(H) 172(H)  BUN 6 - 20 mg/dL _0 Creatinine 0.44 - 1.00 mg/dL 0.82 0.84 0.81  Sodium 135 - 145 mmol/L 142 141 144  Potassium 3.5 - 5.1 mmol/L 3.8 3.7 3.7  Chloride 98 - 111 mmol/L 105 105 104  CO2 22 - 32 mmol/L _1 Calcium 8.9 - 10.3 mg/dL 9.2 9.4 8.8(L)  Total Protein 6.5 - 8.1 g/dL 7.7 8.0 7.2  Total Bilirubin 0.3 - 1.2 mg/dL 0.3 0.3 <0.2(L)  Alkaline Phos 38 - 126 U/L 100 84 80  AST 15 - 41 U/L _2 ALT 0 - 44 U/L _3 08/18/17 Bx:   08/16/17 Molecular Pathology:    RADIOGRAPHIC STUDIES:  .Ct Chest W Contrast  Result Date: 03/01/2018 CLINICAL DATA:  Metastatic lung cancer. EXAM: CT CHEST, ABDOMEN, AND PELVIS WITH CONTRAST TECHNIQUE: Multidetector CT imaging of the chest, abdomen and pelvis was performed following the standard protocol during bolus administration of intravenous contrast. CONTRAST:  180m OMNIPAQUE IOHEXOL 300 MG/ML  SOLN COMPARISON:  11/11/2017. FINDINGS: CT CHEST FINDINGS Cardiovascular: Right IJ Port-A-Cath terminates in the right atrium. Mild atherosclerotic calcification of the aorta. Heart size normal. No pericardial effusion. Mediastinum/Nodes: Low right paratracheal adenopathy measures 1.7 cm, stable. No hilar adenopathy. Bulky right subpectoral and axillary adenopathy has markedly progressed in the interval. Largest lymph node measures 3.2 x 5.5 cm. Previously, the largest lymph node measured 2.3 x 3.0 cm. No left axillary adenopathy. Esophagus is grossly unremarkable. Lungs/Pleura: Parenchymal retraction and bronchiectasis in the medial and posterior aspects of the right hemithorax, stable and presumably treatment related. Subpleural ground-glass nodularity in the left  lower lobe is unchanged. No pleural fluid. Airway is unremarkable. Musculoskeletal: No worrisome lytic or sclerotic lesions. CT ABDOMEN PELVIS FINDINGS Hepatobiliary: Probable focal fat in segment 4. Liver is otherwise unremarkable. Noncalcified stones in the gallbladder. Bile duct measures up to 11 mm, as before. Pancreas: Negative. Spleen: Negative. Adrenals/Urinary Tract: Adrenal glands and right kidney are unremarkable. Subcentimeter low-attenuation lesion in the left kidney is too small to characterize but statistically, likely a cyst. Ureters are decompressed. Bladder is low in volume. Stomach/Bowel: Stomach, small bowel, appendix and colon are unremarkable. Vascular/Lymphatic: Minimal atherosclerotic calcification of the aorta without aneurysm. Retrocrural lymph node measures 13 mm, previously 3 mm. Right external iliac lymph node measures 1.4 cm, stable. Interval resection a right inguinal nodal mass. Reproductive: Mildly hypodense lesions in the uterus measure up to 3.5 cm and are likely fibroids. No adnexal mass. Other: No free fluid. Mesenteries and peritoneum are otherwise unremarkable. Musculoskeletal: Degenerative changes in the spine. No worrisome lytic or sclerotic lesions. IMPRESSION: 1. Bulky right subpectoral/axillary adenopathy, markedly progressive from 11/11/2017. Enlarging retrocrural lymph node. Low right paratracheal adenopathy is stable. 2. Interval resection of a right inguinal nodal mass. Stable right external iliac lymph node. 3. Cholelithiasis.  Stable common bile duct dilatation. 4.  Aortic atherosclerosis (ICD10-170.0). Electronically Signed   By: MLorin PicketM.D.   On: 03/01/2018 13:41   Ct Abdomen Pelvis W Contrast  Result Date: 03/01/2018 CLINICAL DATA:  Metastatic lung cancer. EXAM: CT CHEST, ABDOMEN, AND PELVIS WITH CONTRAST TECHNIQUE: Multidetector CT imaging of the chest, abdomen and pelvis was performed following the standard protocol during bolus administration of  intravenous contrast. CONTRAST:  1027mOMNIPAQUE IOHEXOL 300 MG/ML  SOLN COMPARISON:  11/11/2017. FINDINGS: CT  CHEST FINDINGS Cardiovascular: Right IJ Port-A-Cath terminates in the right atrium. Mild atherosclerotic calcification of the aorta. Heart size normal. No pericardial effusion. Mediastinum/Nodes: Low right paratracheal adenopathy measures 1.7 cm, stable. No hilar adenopathy. Bulky right subpectoral and axillary adenopathy has markedly progressed in the interval. Largest lymph node measures 3.2 x 5.5 cm. Previously, the largest lymph node measured 2.3 x 3.0 cm. No left axillary adenopathy. Esophagus is grossly unremarkable. Lungs/Pleura: Parenchymal retraction and bronchiectasis in the medial and posterior aspects of the right hemithorax, stable and presumably treatment related. Subpleural ground-glass nodularity in the left lower lobe is unchanged. No pleural fluid. Airway is unremarkable. Musculoskeletal: No worrisome lytic or sclerotic lesions. CT ABDOMEN PELVIS FINDINGS Hepatobiliary: Probable focal fat in segment 4. Liver is otherwise unremarkable. Noncalcified stones in the gallbladder. Bile duct measures up to 11 mm, as before. Pancreas: Negative. Spleen: Negative. Adrenals/Urinary Tract: Adrenal glands and right kidney are unremarkable. Subcentimeter low-attenuation lesion in the left kidney is too small to characterize but statistically, likely a cyst. Ureters are decompressed. Bladder is low in volume. Stomach/Bowel: Stomach, small bowel, appendix and colon are unremarkable. Vascular/Lymphatic: Minimal atherosclerotic calcification of the aorta without aneurysm. Retrocrural lymph node measures 13 mm, previously 3 mm. Right external iliac lymph node measures 1.4 cm, stable. Interval resection a right inguinal nodal mass. Reproductive: Mildly hypodense lesions in the uterus measure up to 3.5 cm and are likely fibroids. No adnexal mass. Other: No free fluid. Mesenteries and peritoneum are otherwise  unremarkable. Musculoskeletal: Degenerative changes in the spine. No worrisome lytic or sclerotic lesions. IMPRESSION: 1. Bulky right subpectoral/axillary adenopathy, markedly progressive from 11/11/2017. Enlarging retrocrural lymph node. Low right paratracheal adenopathy is stable. 2. Interval resection of a right inguinal nodal mass. Stable right external iliac lymph node. 3. Cholelithiasis.  Stable common bile duct dilatation. 4.  Aortic atherosclerosis (ICD10-170.0). Electronically Signed   By: Lorin Picket M.D.   On: 03/01/2018 13:41     ASSESSMENT & PLAN:   52 y.o.  African-American female with history of hypertension, diabetes, asthma, dysuria mass and morbid obesity with    #1 h/o Lung Adenocarcinoma Rt sided at least Stage IIIB (on diagnosis) with large right paratracheal mass with mediastinal adenopathy that appears to have grown significantly over the last 6-7 months and rt supraclavicular LN +.  -Noted to have a small mass in the left adrenal on CT but PET/CT neg for metastatic disease. Patient has been a lifelong nonsmoker -MRI of the brain was negative for any metastatic disease. At diagnosis. -High PDL1 expression (90%) on [facility] Neg for EGFR, ALK, ROS-1 and BRAF mutations. -Patient completed her planned definitive chemoradiation with Carbo Taxol on 02/13/2016. No prohibitive toxicities other than some grade 1 skin desquamation and some grade 1-2 radiation esophagitis. She has subsequently received 2 out of 2 planned dose of carboplatin + Taxol. -CTA chest 10/29/2016  no evidence of lung cancer progression in the chest. No PE -CT abd/pelvis-10/29/2016  Interval 5.0 x 5.0 x 4.7 cm mass in the right lateral subcutaneous fat at the level of the mid abdomen. This has CT features compatible with a metastasis or primary neoplasm. -CT C/A/P (05/12/2017): Interval development of right axillary, right external iliac and right inguinal adenopathy. Suspicious for nodal metastasis. 2.  Decrease in size of right paratracheal adenopathy. 3. Decrease in size and enhancement associated with right lateral body wall lesion. 4. New small nonspecific pulmonary nodule in the left lower lobe measuring 6 mm. 5. Stable appearance of changes secondary to  external beam radiation within the right lung  08/03/17 PET which revealed 2.8 cm right axillary lymph node is markedly hypermetabolic and consistent with metastatic disease. Extensive radiation changes involving the right paramediastinal lung. There is a focus of hypermetabolism in the right lower lobe which is suspicious for residual or recurrent tumor.  11/11/17 CT C/A/P revealed There has been interval increase in number of enlarged of right axillary nodes and increase in size of right inguinal adenopathy compatible with progression of disease. Stable appearance of the right lung with changes of external beam radiation.    #2 Now with Metastatic lung adenocarcinoma with isolated metastases in the right lateral subcutaneous fat at the level of the midabdomen. she received maintenance Durvalumab from  05/24/2016 q2weeks, switched to Nivolumab q2weeks on 12/13/16 in the setting of Biopsy proven isolated metastatic disease. Given the tumor is strongly PDL1 positive and patient has only isolated metastatic disease was treated with palliative RT to metastases with significant improvement. Molecular pathology from 515/19 did not reveal obviously targetable mutations with NF1 and TP53 mutations present.  Plan:  -Discussed pt labwork today, 03/06/18; HGB slightly decreased to 9.8, other blood counts and chemistries are stable  -Discussed the 03/01/18 CT C/A/P which revealed Bulky right subpectoral/axillary adenopathy, markedly progressive from 11/11/2017. Enlarging retrocrural lymph node. Low right paratracheal adenopathy is stable. 2. Interval resection of a right inguinal nodal mass. Stable right external iliac lymph node. 3. Cholelithiasis.  Stable common  bile duct dilatation. 4.  Aortic atherosclerosis.  -Given good overall and extended control of metastatic lung cancer by immunotherapy Nivolumab, would be inclined to continue Nivolumab with a plan ton resume RT to address symptomatic right sub-pectoral and right axillary mass  -The pt has no prohibitive toxicities from continuing Nivolumab every 2 weeks at this time.   -Will refer the pt to Dr. Tammi Klippel in [place] again for consideration of RT  -Will refill empiric Levofloxacin as patient lost medication  -Recommend Vitamin A&D ointment for vaginal itching  -Will order Fluconazole -Advised that pt continue follow up with her cardiologist -Advised patient to follow up with her PCP regarding her Lyrica dose. -Will see the pt back in one month   #3 h/o radiation pneumonitis - no new symptoms. Does has some cough with exertion but this has improved.  #4 Improving fatigue (grade 1) - improved fatigue with levothyroxine compliance with near normalization of FT4 levels.  - previously given TSH of 12 -- increased Levolthyroxine from 131mg to 15103m  ---will likely need larger dose but in the setting of baseline tachycardia we are gradually increasing dose to avoid ppt cardiac arrhythmias.  -TSH 11/14/2017 WNL.  #5 normocytic anemia related to chemotherapy -resolving and stable  #6 Left chest wall pain due to costochondritis - mx with pain medications. CTA x 3 neg for PE. No significant pain at this time.  #7 Sinus tachycardia - on BB per cards , no PE on CTA x 3 , -Still having sinus tachycardia . Partly from deconditioning and anemia. Seeing Dr. HiDebara Pickettrom cardiology  -Continues to be on BB  #8 neoplasm related pain is currently controlled without significant medications. Clinically likely has sleep apnea . Would need to be careful with sedative medications . 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 The patient knows to call the clinic with any problems, questions or concerns.  The total time spent in the appt was 25 minutes and more than 50% was on counseling and direct patient cares.    GSullivan LoneMD MAtlantaAAHIVMS SOhio Surgery [facility] LLCCOrthoindy HospitalHematology/Oncology Physician CEn[facility] (Office):       [phone](Work cell):  [phone](Fax):           [phone] I, SBaldwin Jamaica am acting as a scribe for Dr. GSullivan Lone   .I have reviewed the above documentation for accuracy and completeness, and I agree with the above. .Brunetta GeneraMD

## 2018-03-07 ENCOUNTER — Telehealth: Payer: Self-pay

## 2018-03-07 NOTE — Telephone Encounter (Signed)
Per 12/2 los too add [place]. (Provider will be out that week added visit with [place] instead. Will verify with Irene Limbo). per 12/2 los

## 2018-03-15 ENCOUNTER — Ambulatory Visit
Admission: RE | Admit: 2018-03-15 | Discharge: 2018-03-15 | Disposition: A | Discharge status: Home / Self Care | Payer: Medicaid Other | Source: Ambulatory Visit | Attending: Urology | Admitting: Urology

## 2018-03-15 ENCOUNTER — Encounter: Payer: Self-pay | Admitting: Urology

## 2018-03-15 ENCOUNTER — Other Ambulatory Visit: Payer: Self-pay

## 2018-03-15 ENCOUNTER — Ambulatory Visit
Admission: RE | Admit: 2018-03-15 | Discharge: 2018-03-15 | Disposition: A | Discharge status: Home / Self Care | Payer: Medicaid Other | Source: Ambulatory Visit | Attending: Radiation Oncology | Admitting: Radiation Oncology

## 2018-03-15 VITALS — BP 106/82 | HR 117 | Temp 97.8°F | Resp 20 | Ht 69.0 in | Wt 278.6 lb

## 2018-03-15 DIAGNOSIS — Z9221 Personal history of antineoplastic chemotherapy: Secondary | ICD-10-CM | POA: Diagnosis not present

## 2018-03-15 DIAGNOSIS — C773 Secondary and unspecified malignant neoplasm of axilla and upper limb lymph nodes: Secondary | ICD-10-CM | POA: Insufficient documentation

## 2018-03-15 DIAGNOSIS — E669 Obesity, unspecified: Secondary | ICD-10-CM | POA: Diagnosis not present

## 2018-03-15 DIAGNOSIS — K8020 Calculus of gallbladder without cholecystitis without obstruction: Secondary | ICD-10-CM | POA: Insufficient documentation

## 2018-03-15 DIAGNOSIS — C3491 Malignant neoplasm of unspecified part of right bronchus or lung: Secondary | ICD-10-CM | POA: Diagnosis not present

## 2018-03-15 DIAGNOSIS — Z7982 Long term (current) use of aspirin: Secondary | ICD-10-CM | POA: Diagnosis not present

## 2018-03-15 DIAGNOSIS — I700 Atherosclerosis of aorta: Secondary | ICD-10-CM | POA: Diagnosis not present

## 2018-03-15 DIAGNOSIS — G629 Polyneuropathy, unspecified: Secondary | ICD-10-CM | POA: Insufficient documentation

## 2018-03-15 DIAGNOSIS — R000 Tachycardia, unspecified: Secondary | ICD-10-CM | POA: Insufficient documentation

## 2018-03-15 DIAGNOSIS — I10 Essential (primary) hypertension: Secondary | ICD-10-CM | POA: Insufficient documentation

## 2018-03-15 DIAGNOSIS — C792 Secondary malignant neoplasm of skin: Secondary | ICD-10-CM

## 2018-03-15 DIAGNOSIS — Z510 Encounter for antineoplastic radiation therapy: Secondary | ICD-10-CM | POA: Diagnosis present

## 2018-03-15 DIAGNOSIS — J45909 Unspecified asthma, uncomplicated: Secondary | ICD-10-CM | POA: Insufficient documentation

## 2018-03-15 DIAGNOSIS — E7800 Pure hypercholesterolemia, unspecified: Secondary | ICD-10-CM | POA: Diagnosis not present

## 2018-03-15 DIAGNOSIS — Z923 Personal history of irradiation: Secondary | ICD-10-CM | POA: Insufficient documentation

## 2018-03-15 DIAGNOSIS — M129 Arthropathy, unspecified: Secondary | ICD-10-CM | POA: Insufficient documentation

## 2018-03-15 DIAGNOSIS — E039 Hypothyroidism, unspecified: Secondary | ICD-10-CM | POA: Diagnosis not present

## 2018-03-15 DIAGNOSIS — E119 Type 2 diabetes mellitus without complications: Secondary | ICD-10-CM | POA: Diagnosis not present

## 2018-03-15 DIAGNOSIS — Z794 Long term (current) use of insulin: Secondary | ICD-10-CM | POA: Insufficient documentation

## 2018-03-15 DIAGNOSIS — Z79899 Other long term (current) drug therapy: Secondary | ICD-10-CM | POA: Diagnosis not present

## 2018-03-15 NOTE — Progress Notes (Signed)
Radiation Oncology         [phone] ________________________________   Outpatient Re-Consultation Note  Name: Marilyn [name] MRN: [id number]  Date of Service: 03/15/2018 DOB: 05-16-1965  CC:[name], FNP  [name], MD   REFERRING PHYSICIAN: [name], MD  DIAGNOSIS: 52 y/o female with Stage IV adenocarcinoma of the lung now with a painful subpectoral/axillary nodal metastasis    ICD-10-CM   1. Primary cancer of right lung metastatic to other site [facility]) C34.91     HISTORY OF PRESENT ILLNESS: Marilyn [name] is a 52 y.o. female seen at the request of Dr. Irene Limbo for a new metastatic right subpectoral/axillary nodal mass.   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  MRI of the brain was negative at the time of diagnosis.  She was treated with concurrent chemoradiation with Carboplatin/Taxol- completed RT on 02/13/2016 and then completed 2 additional cycles of carboplatin + taxol.  She started maintainence Durvalumab on 05/24/2016 given q2weeks. She was switched to Nivolumab q2weeks on 12/13/16 due to metastatic disease. She presented with a painful, enlarging metastatic right flank mass in 10/2016 which was subsequently treated with palliative radiation therapy in 12/2016.  She tolerated her treatment well and has not had further right flank pain or palpable masses.  She has continued in routine follow up with Dr. Irene Limbo since that time and has remained on Nivolumab which she tolerates well.  In July 2019, she presented with progressive pelvic pain and a repeat CT at that time demonstrated increased size of right inguinal adenopathy consistent with metastatic disease. She elected to proceed with palliative radiation the the enlarging right inguinal nodal mass in 12/2017 which she tolerated  well.   She has had progressive pain in the right shoulder for several months and recently underwent chest/abdomen/pelvis CT scan on 03/01/18 which revealed bulky right subpectoral/axillary adenopathy, markedly progressive from 11/11/17, as well as an enlarging retrocrural lymph node.   She has been referred for consult today to discuss the potential role of palliative radiotherapy to the enlarging right axillary nodal metastasis which is causing pain/discomfort.   PREVIOUS RADIATION THERAPY: Yes 11/30/17 - 12/15/17: The right groin was treated to 30 Gy in 10 fractions.  12/07/16 - 12/20/16: The right flank was treated to 30 Gy in 10 fractions.  01/05/16 - 02/13/16: The right lung was treated to 60 Gy in 30 fractions.  PAST MEDICAL HISTORY:  Past Medical History:  Diagnosis Date  . Arthritis   . Asthma   . Diabetes mellitus    Type II  . Hemorrhoids   . Hypercholesteremia   . Hypertension   . Hypothyroidism   . Metastatic lung cancer (metastasis from lung to other site) (HCC)    Lung, Mets to skin- right flank. CT angio 2019 suspicious for axillary, external iliac, right inguinal mets  . 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 NICM (nonischemic cardiomyopathy) ([place])    a. mild - EF 45-50% by echo 10/2016 but EF 52% by low risk nuc 11/2016. b. Echo 07/2017 - EF normal, mild AI, unremarkable echo otherwise.  . Obese   . Pneumonia 2017  . Sinus tachycardia    a. dates back to at least 2013, etiology unclear.      PAST SURGICAL HISTORY: Past Surgical History:  Procedure Laterality Date  . CESAREAN SECTION    . COLONOSCOPY    . I/D  arm Right 2013  . IR FLUORO GUIDE PORT INSERTION RIGHT  02/18/2017  . IR GENERIC HISTORICAL  01/08/2016   IR US GUIDE VASC ACCESS RIGHT 01/08/2016 [name], DO [facility]  . IR GENERIC HISTORICAL  01/08/2016   IR FLUORO GUIDE CV LINE RIGHT 01/08/2016 [name], DO [facility]  . IR US GUIDE VASC ACCESS RIGHT  02/18/2017  .  MULTIPLE EXTRACTIONS WITH ALVEOLOPLASTY N/A 03/25/2017   Procedure: MULTIPLE EXTRACTION WITH ALVEOLOPLASTY (teeth #s four, six, seven, eight, nine, ten, eleven, one, twenty-three, twenty-four, twenty-five, twenty-six, twenty-nine, thirty);  Surgeon: [name], [name];  Location: [facility];  Service: Oral Surgery;  Laterality: N/A;  . TUBAL LIGATION    . US guided core needle biopsy     of right lower neck/supraclavicular lymph nodes    FAMILY HISTORY:  Family History  Problem Relation Age of Onset  . Heart failure Mother   . Hypertension Mother   . Cancer Neg Hx   . Rheumatologic disease Neg Hx     SOCIAL HISTORY:  Social History   Socioeconomic History  . Marital status: Divorced    Spouse name: Not on file  . Number of children: Not on file  . Years of education: Not on file  . Highest education level: Not on file  Occupational History  . Not on file  Social Needs  . Financial resource strain: Not on file  . Food insecurity:    Worry: Not on file    Inability: Not on file  . Transportation needs:    Medical: No    Non-medical: No  Tobacco Use  . Smoking status: Never Smoker  . Smokeless tobacco: Never Used  Substance and Sexual Activity  . Alcohol use: No  . Drug use: No  . Sexual activity: Not Currently  Lifestyle  . Physical activity:    Days per week: Not on file    Minutes per session: Not on file  . Stress: Not on file  Relationships  . Social connections:    Talks on phone: Not on file    Gets together: Not on file    Attends religious service: Not on file    Active member of club or organization: Not on file    Attends meetings of clubs or organizations: Not on file    Relationship status: Not on file  . Intimate partner violence:    Fear of current or ex partner: No    Emotionally abused: No    Physically abused: No    Forced sexual activity: No  Other Topics Concern  . Not on file  Social History Narrative   No bird or mold exposure. No recent  travel.   11-23-17 Unable to ask abuse questions mother with her today.    ALLERGIES: Patient has no known allergies.  MEDICATIONS:  Current Outpatient Medications  Medication Sig Dispense Refill  . aspirin EC 81 MG tablet Take 1 tablet (81 mg total) by mouth daily. 60 tablet 2  . DULoxetine (CYMBALTA) 60 MG capsule TAKE 1 CAPSULE BY MOUTH EVERY DAY 30 capsule 2  . fluconazole (DIFLUCAN) 200 MG tablet Take 1 tablet (200 mg total) by mouth daily. 1 tablet 0  . hydrOXYzine (ATARAX/VISTARIL) 10 MG tablet Take 1 tablet (10 mg total) by mouth 3 (three) times daily as needed for itching. 30 tablet 0  . insulin aspart (NOVOLOG) 100 UNIT/ML  FlexPen As per sliding scale instructions (Patient taking differently: Inject 0-10 Units into the skin 3 (three) times daily. As per sliding scale instructions) 15 mL 2  . Insulin Degludec (TRESIBA FLEXTOUCH) 200 UNIT/ML SOPN Inject 100 Units into the skin daily before breakfast.     . levofloxacin (LEVAQUIN) 500 MG tablet Take 1 tablet (500 mg total) by mouth daily. 5 tablet 0  . levothyroxine (SYNTHROID, LEVOTHROID) 150 MCG tablet Take 1 tablet (150 mcg total) by mouth daily before breakfast. 30 tablet 2  . lidocaine-prilocaine (EMLA) cream Apply 1 application topically as needed. 30 g 6  . metoprolol succinate (TOPROL-XL) 50 MG 24 hr tablet Take 1 tablet (50mg ) by mouth in the morning and 1/2 tablet (25mg ) in the evening. Take with or immediately following a meal. 135 tablet 3  . naproxen (NAPROSYN) 500 MG tablet Take 1 tablet (500 mg total) by mouth 2 (two) times daily with a meal. 20 tablet 0  . oxyCODONE-acetaminophen (PERCOCET) 5-325 MG tablet Take 1 tablet by mouth every 6 (six) hours as needed for moderate pain or severe pain. 50 tablet 0  . pregabalin (LYRICA) 50 MG capsule Take 50 mg by mouth 3 (three) times daily.    [name] senna-docusate (SENNA S) 8.6-50 MG tablet Take 2 tablets by mouth 2 (two) times daily. May reduce to 2 tab po HS as needed once regular  BM estabished (Patient taking differently: Take 2 tablets by mouth 2 (two) times daily as needed (FOR CONSTIPATION). =) 60 tablet 1  . traZODone (DESYREL) 50 MG tablet Take 1-2 tablets (50-100 mg total) by mouth at bedtime as needed for sleep. 60 tablet 0  . Vitamins A & D (VITAMIN A & D) OINT Apply 1 application topically 2 (two) times daily. 1 Tube 3  . nystatin cream (MYCOSTATIN) Apply 1 application topically 2 (two) times daily.     No current facility-administered medications for this encounter.    Facility-Administered Medications Ordered in Other Encounters  Medication Dose Route Frequency Provider Last Rate Last Dose  . sodium chloride 0.9 % injection 10 mL  10 mL Intravenous PRN [name], MD   10 mL at 05/24/16 1252    REVIEW OF SYSTEMS:  On review of systems, the patient reports that she is doing well overall. She denies any chest pain, shortness of breath, fevers, chills, night sweats, unintended weight changes. She denies any bowel or bladder disturbances, nausea or vomiting. She reports cough, vaginal itchiness, and some urinary leakage with coughing. She reports intermittent pain in her right shoulder, both below her armpit and in the top of her shoulder, with associated restriction in range of motion which is quite bothersome.  It is also quite painful in certain positions when she is sleeping.  A complete review of systems is obtained and is otherwise negative.    PHYSICAL EXAM:  Wt Readings from Last 3 Encounters:  03/15/18 278 lb 9.6 oz (126.4 kg)  03/06/18 280 lb 11.2 oz (127.3 kg)  02/20/18 280 lb 1.6 oz (127.1 kg)   Temp Readings from Last 3 Encounters:  03/15/18 97.8 F (36.6 C) (Oral)  03/06/18 98.5 F (36.9 C) (Oral)  02/20/18 98.4 F (36.9 C) (Oral)   BP Readings from Last 3 Encounters:  03/15/18 106/82  03/06/18 (!) 143/90  02/20/18 111/85   Pulse Readings from Last 3 Encounters:  03/15/18 (!) 117  03/06/18 (!) 128  02/20/18 (!) 120   Pain  Assessment Pain Score: 2  Pain Loc: Donnajean Lopes  In general this is a well appearing African-american female in no acute distress. She is alert and oriented x4 and appropriate throughout the examination. HEENT reveals that the patient is normocephalic, atraumatic. EOMs are intact. PERRLA. Skin is intact without any evidence of gross lesions. Cardiovascular exam reveals a regular rate and rhythm, no clicks rubs or murmurs are auscultated. Chest is clear to auscultation bilaterally. Lymphatic assessment is performed and does not reveal any adenopathy in the cervical or supraclavicular chains. However, there are palpable, tender, enlarging lymph nodes in right axilla. She has decreased ROM in the right shoulder with active forward and lateral extension as well as internal/external rotation.  Abdomen has active bowel sounds in all quadrants and is intact. The abdomen is soft, non tender, non distended.  Lower extremities are negative for pretibial pitting edema, deep calf tenderness, cyanosis or clubbing.  KPS = 90  100 - Normal; no complaints; no evidence of disease. 90   - Able to carry on normal activity; minor signs or symptoms of disease. 80   - Normal activity with effort; some signs or symptoms of disease. 34   - Cares for self; unable to carry on normal activity or to do active work. 60   - Requires occasional assistance, but is able to care for most of his personal needs. 50   - Requires considerable assistance and frequent medical care. 2   - Disabled; requires special care and assistance. 63   - Severely disabled; hospital admission is indicated although death not imminent. 40   - Very sick; hospital admission necessary; active supportive treatment necessary. 10   - Moribund; fatal processes progressing rapidly. 0     - Dead  Karnofsky DA, Abelmann [place], Craver LS and Burchenal JH [phone]) The use of the nitrogen mustards in the palliative treatment of carcinoma: with particular reference to  bronchogenic carcinoma Cancer 1 634-56  LABORATORY DATA:  Lab Results  Component Value Date   WBC 6.6 03/06/2018   HGB 9.8 (L) 03/06/2018   HCT 30.2 (L) 03/06/2018   MCV 90.1 03/06/2018   PLT 260 03/06/2018   Lab Results  Component Value Date   NA 142 03/06/2018   K 3.8 03/06/2018   CL 105 03/06/2018   CO2 26 03/06/2018   Lab Results  Component Value Date   ALT 11 03/06/2018   AST 21 03/06/2018   ALKPHOS 100 03/06/2018   BILITOT 0.3 03/06/2018     RADIOGRAPHY: Ct Chest W Contrast  Result Date: 03/01/2018 CLINICAL DATA:  Metastatic lung cancer. EXAM: CT CHEST, ABDOMEN, AND PELVIS WITH CONTRAST TECHNIQUE: Multidetector CT imaging of the chest, abdomen and pelvis was performed following the standard protocol during bolus administration of intravenous contrast. CONTRAST:  129mL OMNIPAQUE IOHEXOL 300 MG/ML  SOLN COMPARISON:  11/11/2017. FINDINGS: CT CHEST FINDINGS Cardiovascular: Right IJ Port-A-Cath terminates in the right atrium. Mild atherosclerotic calcification of the aorta. Heart size normal. No pericardial effusion. Mediastinum/Nodes: Low right paratracheal adenopathy measures 1.7 cm, stable. No hilar adenopathy. Bulky right subpectoral and axillary adenopathy has markedly progressed in the interval. Largest lymph node measures 3.2 x 5.5 cm. Previously, the largest lymph node measured 2.3 x 3.0 cm. No left axillary adenopathy. Esophagus is grossly unremarkable. Lungs/Pleura: Parenchymal retraction and bronchiectasis in the medial and posterior aspects of the right hemithorax, stable and presumably treatment related. Subpleural ground-glass nodularity in the left lower lobe is unchanged. No pleural fluid. Airway is unremarkable. Musculoskeletal: No worrisome lytic or sclerotic lesions. CT ABDOMEN PELVIS FINDINGS  Hepatobiliary: Probable focal fat in segment 4. Liver is otherwise unremarkable. Noncalcified stones in the gallbladder. Bile duct measures up to 11 mm, as before. Pancreas:  Negative. Spleen: Negative. Adrenals/Urinary Tract: Adrenal glands and right kidney are unremarkable. Subcentimeter low-attenuation lesion in the left kidney is too small to characterize but statistically, likely a cyst. Ureters are decompressed. Bladder is low in volume. Stomach/Bowel: Stomach, small bowel, appendix and colon are unremarkable. Vascular/Lymphatic: Minimal atherosclerotic calcification of the aorta without aneurysm. Retrocrural lymph node measures 13 mm, previously 3 mm. Right external iliac lymph node measures 1.4 cm, stable. Interval resection a right inguinal nodal mass. Reproductive: Mildly hypodense lesions in the uterus measure up to 3.5 cm and are likely fibroids. No adnexal mass. Other: No free fluid. Mesenteries and peritoneum are otherwise unremarkable. Musculoskeletal: Degenerative changes in the spine. No worrisome lytic or sclerotic lesions. IMPRESSION: 1. Bulky right subpectoral/axillary adenopathy, markedly progressive from 11/11/2017. Enlarging retrocrural lymph node. Low right paratracheal adenopathy is stable. 2. Interval resection of a right inguinal nodal mass. Stable right external iliac lymph node. 3. Cholelithiasis.  Stable common bile duct dilatation. 4.  Aortic atherosclerosis (ICD10-170.0). Electronically Signed   By: Lorin Picket M.D.   On: 03/01/2018 13:41   Ct Abdomen Pelvis W Contrast  Result Date: 03/01/2018 CLINICAL DATA:  Metastatic lung cancer. EXAM: CT CHEST, ABDOMEN, AND PELVIS WITH CONTRAST TECHNIQUE: Multidetector CT imaging of the chest, abdomen and pelvis was performed following the standard protocol during bolus administration of intravenous contrast. CONTRAST:  151mL OMNIPAQUE IOHEXOL 300 MG/ML  SOLN COMPARISON:  11/11/2017. FINDINGS: CT CHEST FINDINGS Cardiovascular: Right IJ Port-A-Cath terminates in the right atrium. Mild atherosclerotic calcification of the aorta. Heart size normal. No pericardial effusion. Mediastinum/Nodes: Low right  paratracheal adenopathy measures 1.7 cm, stable. No hilar adenopathy. Bulky right subpectoral and axillary adenopathy has markedly progressed in the interval. Largest lymph node measures 3.2 x 5.5 cm. Previously, the largest lymph node measured 2.3 x 3.0 cm. No left axillary adenopathy. Esophagus is grossly unremarkable. Lungs/Pleura: Parenchymal retraction and bronchiectasis in the medial and posterior aspects of the right hemithorax, stable and presumably treatment related. Subpleural ground-glass nodularity in the left lower lobe is unchanged. No pleural fluid. Airway is unremarkable. Musculoskeletal: No worrisome lytic or sclerotic lesions. CT ABDOMEN PELVIS FINDINGS Hepatobiliary: Probable focal fat in segment 4. Liver is otherwise unremarkable. Noncalcified stones in the gallbladder. Bile duct measures up to 11 mm, as before. Pancreas: Negative. Spleen: Negative. Adrenals/Urinary Tract: Adrenal glands and right kidney are unremarkable. Subcentimeter low-attenuation lesion in the left kidney is too small to characterize but statistically, likely a cyst. Ureters are decompressed. Bladder is low in volume. Stomach/Bowel: Stomach, small bowel, appendix and colon are unremarkable. Vascular/Lymphatic: Minimal atherosclerotic calcification of the aorta without aneurysm. Retrocrural lymph node measures 13 mm, previously 3 mm. Right external iliac lymph node measures 1.4 cm, stable. Interval resection a right inguinal nodal mass. Reproductive: Mildly hypodense lesions in the uterus measure up to 3.5 cm and are likely fibroids. No adnexal mass. Other: No free fluid. Mesenteries and peritoneum are otherwise unremarkable. Musculoskeletal: Degenerative changes in the spine. No worrisome lytic or sclerotic lesions. IMPRESSION: 1. Bulky right subpectoral/axillary adenopathy, markedly progressive from 11/11/2017. Enlarging retrocrural lymph node. Low right paratracheal adenopathy is stable. 2. Interval resection of a right  inguinal nodal mass. Stable right external iliac lymph node. 3. Cholelithiasis.  Stable common bile duct dilatation. 4.  Aortic atherosclerosis (ICD10-170.0). Electronically Signed   By: Lorin Picket M.D.   On:  03/01/2018 13:41      IMPRESSION/PLAN: 1. 52 y.o. African-american female with Stage IV adenocarcinoma of the lung now with a painful right subpectoral/axillary nodal metastases  Today, we talked to the patient about the findings and work-up thus far.  We discussed the natural history of metastatic lung cancer and general treatment, highlighting the role of palliative radiotherapy in the management.  We discussed the available radiation techniques, and focused on the details of logistics and delivery.  The recommendation is for a total of 10 daily radiation treatments to the right axillary nodal mass delivered over the course of 2 weeks.  We reviewed the anticipated acute and late sequelae associated with radiation in this setting.  The patient was encouraged to ask questions that we answered to the best of our ability.      At the end of our discussion, the patient elects to proceed with palliative radiation and will be scheduled for CT simulation on Thursday, 03/16/18 at 9:15 am in anticipation of beginning treatments early next week.  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  She has freely signed a written consent today in the office and a copy of this document was placed in her medical chart.  The patient was provided a copy as well.  She knows to call with any questions or concerns in the interim.  We spent 60 minutes minutes face to face with the patient and more than 50% of that time was spent in counseling and/or coordination of care.   Nicholos Johns, PA-C    Tyler Pita, MD  [facility] Direct Dial: [phone]  Fax: [phone] [email or web address]  Skype  [email or web address]  This  document serves as a record of services personally performed by Tyler Pita, MD and Freeman Caldron, PA-C. It was created on their behalf by Wilburn Mylar, a trained medical scribe. The creation of this record is based on the scribe's personal observations and the provider's statements to them. This document has been checked and approved by the attending provider.

## 2018-03-16 ENCOUNTER — Ambulatory Visit
Admission: RE | Admit: 2018-03-16 | Discharge: 2018-03-16 | Disposition: A | Discharge status: Home / Self Care | Payer: Medicaid Other | Source: Ambulatory Visit | Attending: Radiation Oncology | Admitting: Radiation Oncology

## 2018-03-16 DIAGNOSIS — Z510 Encounter for antineoplastic radiation therapy: Secondary | ICD-10-CM | POA: Diagnosis not present

## 2018-03-16 DIAGNOSIS — C3490 Malignant neoplasm of unspecified part of unspecified bronchus or lung: Secondary | ICD-10-CM

## 2018-03-16 DIAGNOSIS — C773 Secondary and unspecified malignant neoplasm of axilla and upper limb lymph nodes: Secondary | ICD-10-CM | POA: Insufficient documentation

## 2018-03-16 NOTE — Progress Notes (Signed)
  Radiation Oncology         [phone] ________________________________  Name: Marilyn [name] MRN: [id number]  Date: 03/16/2018  DOB: 07/23/1965  SIMULATION AND TREATMENT PLANNING NOTE    ICD-10-CM   1. Non-small cell lung cancer, unspecified laterality ([place]) C34.90   2. 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  Identity was confirmed.  All relevant records and images related to the planned course of therapy were reviewed.  The patient freely provided informed written consent to proceed with treatment after reviewing the details related to the planned course of therapy. The consent form was witnessed and verified by the simulation staff.  Then, the patient was set-up in a stable reproducible  supine position for radiation therapy.  CT images were obtained.  Surface markings were placed.  The CT images were loaded into the planning software.  Then the target and avoidance structures were contoured.  Treatment planning then occurred.  The radiation prescription was entered and confirmed.  Then, I designed and supervised the construction of a total of 3 medically necessary complex treatment devices, MLCs to shield the lungs.  I have requested : Isodose Plan.   PLAN:  The patient will receive 30 Gy in 10 fraction.  ________________________________  Sheral Apley Tammi Klippel, M.D.

## 2018-03-17 ENCOUNTER — Other Ambulatory Visit: Payer: Self-pay | Admitting: Hematology

## 2018-03-17 DIAGNOSIS — C3491 Malignant neoplasm of unspecified part of right bronchus or lung: Secondary | ICD-10-CM

## 2018-03-20 ENCOUNTER — Inpatient Hospital Stay: Payer: Medicaid Other

## 2018-03-20 ENCOUNTER — Ambulatory Visit
Admission: RE | Admit: 2018-03-20 | Discharge: 2018-03-20 | Disposition: A | Discharge status: Home / Self Care | Payer: Medicaid Other | Source: Ambulatory Visit | Attending: Radiation Oncology | Admitting: Radiation Oncology

## 2018-03-20 VITALS — BP 125/89 | HR 115 | Temp 98.0°F | Resp 20

## 2018-03-20 DIAGNOSIS — Z5112 Encounter for antineoplastic immunotherapy: Secondary | ICD-10-CM | POA: Diagnosis not present

## 2018-03-20 DIAGNOSIS — C3491 Malignant neoplasm of unspecified part of right bronchus or lung: Secondary | ICD-10-CM

## 2018-03-20 DIAGNOSIS — C792 Secondary malignant neoplasm of skin: Secondary | ICD-10-CM

## 2018-03-20 DIAGNOSIS — Z95828 Presence of other vascular implants and grafts: Secondary | ICD-10-CM

## 2018-03-20 DIAGNOSIS — Z7189 Other specified counseling: Secondary | ICD-10-CM

## 2018-03-20 DIAGNOSIS — Z510 Encounter for antineoplastic radiation therapy: Secondary | ICD-10-CM | POA: Diagnosis not present

## 2018-03-20 LAB — CMP (CANCER CENTER ONLY)
ALT: 11 U/L (ref 0–44)
AST: 14 U/L — ABNORMAL LOW (ref 15–41)
Albumin: 3.1 g/dL — ABNORMAL LOW (ref 3.5–5.0)
Alkaline Phosphatase: 90 U/L (ref 38–126)
Anion gap: 10 (ref 5–15)
BUN: 9 mg/dL (ref 6–20)
CHLORIDE: 102 mmol/L (ref 98–111)
CO2: 28 mmol/L (ref 22–32)
Calcium: 8.8 mg/dL — ABNORMAL LOW (ref 8.9–10.3)
Creatinine: 0.78 mg/dL (ref 0.44–1.00)
GFR, Est AFR Am: >60 mL/min (ref >60)
GFR, Estimated: >60 mL/min (ref >60)
Glucose, Bld: 264 mg/dL — ABNORMAL HIGH (ref 70–99)
POTASSIUM: 3.8 mmol/L (ref 3.5–5.1)
Sodium: 140 mmol/L (ref 135–145)
Total Bilirubin: <0.2 mg/dL — ABNORMAL LOW (ref 0.3–1.2)
Total Protein: 7.4 g/dL (ref 6.5–8.1)

## 2018-03-20 LAB — CBC WITH DIFFERENTIAL/PLATELET
Abs Immature Granulocytes: 0.02 10*3/uL (ref 0.00–0.07)
Basophils Absolute: 0 10*3/uL (ref 0.0–0.1)
Basophils Relative: 0 %
Eosinophils Absolute: 0.6 10*3/uL — ABNORMAL HIGH (ref 0.0–0.5)
Eosinophils Relative: 11 %
HCT: 30.2 % — ABNORMAL LOW (ref 36.0–46.0)
Hemoglobin: 9.7 g/dL — ABNORMAL LOW (ref 12.0–15.0)
Immature Granulocytes: 0 %
Lymphocytes Relative: 18 %
Lymphs Abs: 1 10*3/uL (ref 0.7–4.0)
MCH: 29.3 pg (ref 26.0–34.0)
MCHC: 32.1 g/dL (ref 30.0–36.0)
MCV: 91.2 fL (ref 80.0–100.0)
Monocytes Absolute: 0.3 10*3/uL (ref 0.1–1.0)
Monocytes Relative: 6 %
Neutro Abs: 3.4 10*3/uL (ref 1.7–7.7)
Neutrophils Relative %: 65 %
Platelets: 254 10*3/uL (ref 150–400)
RBC: 3.31 MIL/uL — AB (ref 3.87–5.11)
RDW: 12.9 % (ref 11.5–15.5)
WBC: 5.3 10*3/uL (ref 4.0–10.5)
nRBC: 0 % (ref 0.0–0.2)

## 2018-03-20 MED ORDER — SODIUM CHLORIDE 0.9% FLUSH
10.0000 mL | INTRAVENOUS | Status: DC | PRN
  Administered 2018-03-20: 10 mL via INTRAVENOUS
  Filled 2018-03-20: qty 10

## 2018-03-20 MED ORDER — SODIUM CHLORIDE 0.9 % IV SOLN
240.0000 mg | Freq: Once | INTRAVENOUS | Status: AC
  Administered 2018-03-20: 240 mg via INTRAVENOUS
  Filled 2018-03-20: qty 24

## 2018-03-20 MED ORDER — HEPARIN SOD (PORK) LOCK FLUSH 100 UNIT/ML IV SOLN
500.0000 [IU] | Freq: Once | INTRAVENOUS | Status: AC | PRN
  Administered 2018-03-20: 500 [IU]
  Filled 2018-03-20: qty 5

## 2018-03-20 MED ORDER — SODIUM CHLORIDE 0.9% FLUSH
10.0000 mL | INTRAVENOUS | Status: DC | PRN
  Administered 2018-03-20: 10 mL
  Filled 2018-03-20: qty 10

## 2018-03-20 MED ORDER — SODIUM CHLORIDE 0.9 % IV SOLN
Freq: Once | INTRAVENOUS | Status: AC
  Administered 2018-03-20: 14:00:00 via INTRAVENOUS
  Filled 2018-03-20: qty 250

## 2018-03-20 NOTE — Patient Instructions (Signed)

## 2018-03-20 NOTE — Patient Instructions (Signed)
[facility] Discharge Instructions for Patients Receiving Chemotherapy  Today you received the following chemotherapy agents Opdivo  To help prevent nausea and vomiting after your treatment, we encourage you to take your nausea medication as directed   If you develop nausea and vomiting that is not controlled by your nausea medication, call the clinic.   BELOW ARE SYMPTOMS THAT SHOULD BE REPORTED IMMEDIATELY:  *FEVER GREATER THAN 100.5 F  *CHILLS WITH OR WITHOUT FEVER  NAUSEA AND VOMITING THAT IS NOT CONTROLLED WITH YOUR NAUSEA MEDICATION  *UNUSUAL SHORTNESS OF BREATH  *UNUSUAL BRUISING OR BLEEDING  TENDERNESS IN MOUTH AND THROAT WITH OR WITHOUT PRESENCE OF ULCERS  *URINARY PROBLEMS  *BOWEL PROBLEMS  UNUSUAL RASH Items with * indicate a potential emergency and should be followed up as soon as possible.  Feel free to call the clinic should you have any questions or concerns. The clinic phone number is [phone].  Please show the CHEMO ALERT CARD at check-in to the Emergency Department and triage nurse.   

## 2018-03-20 NOTE — Progress Notes (Signed)
Per Dr. Irene Limbo: Marilyn [name] to treat today with HR 115

## 2018-03-21 ENCOUNTER — Ambulatory Visit
Admission: RE | Admit: 2018-03-21 | Discharge: 2018-03-21 | Disposition: A | Discharge status: Home / Self Care | Payer: Medicaid Other | Source: Ambulatory Visit | Attending: Radiation Oncology | Admitting: Radiation Oncology

## 2018-03-21 DIAGNOSIS — Z510 Encounter for antineoplastic radiation therapy: Secondary | ICD-10-CM | POA: Diagnosis not present

## 2018-03-22 ENCOUNTER — Ambulatory Visit
Admission: RE | Admit: 2018-03-22 | Discharge: 2018-03-22 | Disposition: A | Discharge status: Home / Self Care | Payer: Medicaid Other | Source: Ambulatory Visit | Attending: Radiation Oncology | Admitting: Radiation Oncology

## 2018-03-22 DIAGNOSIS — Z510 Encounter for antineoplastic radiation therapy: Secondary | ICD-10-CM | POA: Diagnosis not present

## 2018-03-23 ENCOUNTER — Ambulatory Visit
Admission: RE | Admit: 2018-03-23 | Discharge: 2018-03-23 | Disposition: A | Discharge status: Home / Self Care | Payer: Medicaid Other | Source: Ambulatory Visit | Attending: Radiation Oncology | Admitting: Radiation Oncology

## 2018-03-23 DIAGNOSIS — Z510 Encounter for antineoplastic radiation therapy: Secondary | ICD-10-CM | POA: Diagnosis not present

## 2018-03-24 ENCOUNTER — Ambulatory Visit
Admission: RE | Admit: 2018-03-24 | Discharge: 2018-03-24 | Disposition: A | Discharge status: Home / Self Care | Payer: Medicaid Other | Source: Ambulatory Visit | Attending: Radiation Oncology | Admitting: Radiation Oncology

## 2018-03-24 DIAGNOSIS — Z510 Encounter for antineoplastic radiation therapy: Secondary | ICD-10-CM | POA: Diagnosis not present

## 2018-03-27 ENCOUNTER — Ambulatory Visit
Admission: RE | Admit: 2018-03-27 | Discharge: 2018-03-27 | Disposition: A | Discharge status: Home / Self Care | Payer: Medicaid Other | Source: Ambulatory Visit | Attending: Radiation Oncology | Admitting: Radiation Oncology

## 2018-03-27 DIAGNOSIS — Z510 Encounter for antineoplastic radiation therapy: Secondary | ICD-10-CM | POA: Diagnosis not present

## 2018-03-28 ENCOUNTER — Ambulatory Visit
Admission: RE | Admit: 2018-03-28 | Discharge: 2018-03-28 | Disposition: A | Discharge status: Home / Self Care | Payer: Medicaid Other | Source: Ambulatory Visit | Attending: Radiation Oncology | Admitting: Radiation Oncology

## 2018-03-28 DIAGNOSIS — Z510 Encounter for antineoplastic radiation therapy: Secondary | ICD-10-CM | POA: Diagnosis not present

## 2018-03-30 ENCOUNTER — Ambulatory Visit
Admission: RE | Admit: 2018-03-30 | Discharge: 2018-03-30 | Disposition: A | Discharge status: Home / Self Care | Payer: Medicaid Other | Source: Ambulatory Visit | Attending: Radiation Oncology | Admitting: Radiation Oncology

## 2018-03-30 DIAGNOSIS — Z510 Encounter for antineoplastic radiation therapy: Secondary | ICD-10-CM | POA: Diagnosis not present

## 2018-03-31 ENCOUNTER — Ambulatory Visit
Admission: RE | Admit: 2018-03-31 | Discharge: 2018-03-31 | Disposition: A | Discharge status: Home / Self Care | Payer: Medicaid Other | Source: Ambulatory Visit | Attending: Radiation Oncology | Admitting: Radiation Oncology

## 2018-03-31 DIAGNOSIS — Z510 Encounter for antineoplastic radiation therapy: Secondary | ICD-10-CM | POA: Diagnosis not present

## 2018-04-03 ENCOUNTER — Ambulatory Visit (HOSPITAL_COMMUNITY)
Admission: RE | Admit: 2018-04-03 | Discharge: 2018-04-03 | Disposition: A | Discharge status: Home / Self Care | Payer: Medicaid Other | Source: Ambulatory Visit | Attending: Adult Health | Admitting: Adult Health

## 2018-04-03 ENCOUNTER — Other Ambulatory Visit: Payer: Medicaid Other

## 2018-04-03 ENCOUNTER — Inpatient Hospital Stay (HOSPITAL_BASED_OUTPATIENT_CLINIC_OR_DEPARTMENT_OTHER): Payer: Medicaid Other | Admitting: Adult Health

## 2018-04-03 ENCOUNTER — Inpatient Hospital Stay: Payer: Medicaid Other

## 2018-04-03 ENCOUNTER — Ambulatory Visit
Admission: RE | Admit: 2018-04-03 | Discharge: 2018-04-03 | Disposition: A | Discharge status: Home / Self Care | Payer: Medicaid Other | Source: Ambulatory Visit | Attending: Radiation Oncology | Admitting: Radiation Oncology

## 2018-04-03 ENCOUNTER — Encounter: Payer: Self-pay | Admitting: Adult Health

## 2018-04-03 VITALS — BP 121/82 | HR 135 | Temp 98.3°F | Resp 18 | Ht 69.0 in | Wt 276.7 lb

## 2018-04-03 DIAGNOSIS — R000 Tachycardia, unspecified: Secondary | ICD-10-CM | POA: Insufficient documentation

## 2018-04-03 DIAGNOSIS — C778 Secondary and unspecified malignant neoplasm of lymph nodes of multiple regions: Secondary | ICD-10-CM

## 2018-04-03 DIAGNOSIS — Z79899 Other long term (current) drug therapy: Secondary | ICD-10-CM

## 2018-04-03 DIAGNOSIS — M940 Chondrocostal junction syndrome [Tietze]: Secondary | ICD-10-CM

## 2018-04-03 DIAGNOSIS — C3490 Malignant neoplasm of unspecified part of unspecified bronchus or lung: Secondary | ICD-10-CM | POA: Insufficient documentation

## 2018-04-03 DIAGNOSIS — E7800 Pure hypercholesterolemia, unspecified: Secondary | ICD-10-CM

## 2018-04-03 DIAGNOSIS — I428 Other cardiomyopathies: Secondary | ICD-10-CM

## 2018-04-03 DIAGNOSIS — R0602 Shortness of breath: Secondary | ICD-10-CM

## 2018-04-03 DIAGNOSIS — E1140 Type 2 diabetes mellitus with diabetic neuropathy, unspecified: Secondary | ICD-10-CM

## 2018-04-03 DIAGNOSIS — Z95828 Presence of other vascular implants and grafts: Secondary | ICD-10-CM

## 2018-04-03 DIAGNOSIS — Z510 Encounter for antineoplastic radiation therapy: Secondary | ICD-10-CM | POA: Diagnosis not present

## 2018-04-03 DIAGNOSIS — C3491 Malignant neoplasm of unspecified part of right bronchus or lung: Secondary | ICD-10-CM | POA: Diagnosis not present

## 2018-04-03 DIAGNOSIS — C792 Secondary malignant neoplasm of skin: Secondary | ICD-10-CM

## 2018-04-03 DIAGNOSIS — E669 Obesity, unspecified: Secondary | ICD-10-CM

## 2018-04-03 DIAGNOSIS — M129 Arthropathy, unspecified: Secondary | ICD-10-CM

## 2018-04-03 DIAGNOSIS — K208 Other esophagitis: Secondary | ICD-10-CM

## 2018-04-03 DIAGNOSIS — J45909 Unspecified asthma, uncomplicated: Secondary | ICD-10-CM

## 2018-04-03 DIAGNOSIS — Z5112 Encounter for antineoplastic immunotherapy: Secondary | ICD-10-CM | POA: Diagnosis not present

## 2018-04-03 DIAGNOSIS — I700 Atherosclerosis of aorta: Secondary | ICD-10-CM

## 2018-04-03 DIAGNOSIS — K8020 Calculus of gallbladder without cholecystitis without obstruction: Secondary | ICD-10-CM

## 2018-04-03 DIAGNOSIS — E039 Hypothyroidism, unspecified: Secondary | ICD-10-CM

## 2018-04-03 DIAGNOSIS — E1165 Type 2 diabetes mellitus with hyperglycemia: Secondary | ICD-10-CM

## 2018-04-03 DIAGNOSIS — G893 Neoplasm related pain (acute) (chronic): Secondary | ICD-10-CM

## 2018-04-03 DIAGNOSIS — Z7982 Long term (current) use of aspirin: Secondary | ICD-10-CM

## 2018-04-03 DIAGNOSIS — M25511 Pain in right shoulder: Secondary | ICD-10-CM

## 2018-04-03 DIAGNOSIS — G629 Polyneuropathy, unspecified: Secondary | ICD-10-CM

## 2018-04-03 LAB — TSH: TSH: 2.28 u[IU]/mL (ref 0.308–3.960)

## 2018-04-03 LAB — CBC WITH DIFFERENTIAL/PLATELET
ABS IMMATURE GRANULOCYTES: 0.02 10*3/uL (ref 0.00–0.07)
Basophils Absolute: 0 10*3/uL (ref 0.0–0.1)
Basophils Relative: 0 %
Eosinophils Absolute: 0.5 10*3/uL (ref 0.0–0.5)
Eosinophils Relative: 8 %
HCT: 31 % — ABNORMAL LOW (ref 36.0–46.0)
Hemoglobin: 9.9 g/dL — ABNORMAL LOW (ref 12.0–15.0)
Immature Granulocytes: 0 %
LYMPHS PCT: 11 %
Lymphs Abs: 0.8 10*3/uL (ref 0.7–4.0)
MCH: 29.4 pg (ref 26.0–34.0)
MCHC: 31.9 g/dL (ref 30.0–36.0)
MCV: 92 fL (ref 80.0–100.0)
Monocytes Absolute: 0.5 10*3/uL (ref 0.1–1.0)
Monocytes Relative: 7 %
NEUTROS ABS: 5 10*3/uL (ref 1.7–7.7)
Neutrophils Relative %: 74 %
Platelets: 256 10*3/uL (ref 150–400)
RBC: 3.37 MIL/uL — ABNORMAL LOW (ref 3.87–5.11)
RDW: 12.8 % (ref 11.5–15.5)
WBC: 6.7 10*3/uL (ref 4.0–10.5)
nRBC: 0 % (ref 0.0–0.2)

## 2018-04-03 LAB — CMP (CANCER CENTER ONLY)
ALT: 10 U/L (ref 0–44)
AST: 13 U/L — ABNORMAL LOW (ref 15–41)
Albumin: 3.1 g/dL — ABNORMAL LOW (ref 3.5–5.0)
Alkaline Phosphatase: 95 U/L (ref 38–126)
Anion gap: 11 (ref 5–15)
BILIRUBIN TOTAL: 0.2 mg/dL — AB (ref 0.3–1.2)
BUN: 11 mg/dL (ref 6–20)
CHLORIDE: 102 mmol/L (ref 98–111)
CO2: 25 mmol/L (ref 22–32)
Calcium: 9.1 mg/dL (ref 8.9–10.3)
Creatinine: 0.81 mg/dL (ref 0.44–1.00)
GFR, Est AFR Am: >60 mL/min (ref >60)
Glucose, Bld: 282 mg/dL — ABNORMAL HIGH (ref 70–99)
Potassium: 3.9 mmol/L (ref 3.5–5.1)
Sodium: 138 mmol/L (ref 135–145)
Total Protein: 7.7 g/dL (ref 6.5–8.1)

## 2018-04-03 MED ORDER — HEPARIN SOD (PORK) LOCK FLUSH 100 UNIT/ML IV SOLN
INTRAVENOUS | Status: AC
  Filled 2018-04-03: qty 5

## 2018-04-03 MED ORDER — IOPAMIDOL (ISOVUE-370) INJECTION 76%
INTRAVENOUS | Status: AC
  Filled 2018-04-03: qty 100

## 2018-04-03 MED ORDER — SODIUM CHLORIDE (PF) 0.9 % IJ SOLN
INTRAMUSCULAR | Status: AC
  Filled 2018-04-03: qty 50

## 2018-04-03 MED ORDER — IOPAMIDOL (ISOVUE-370) INJECTION 76%
100.0000 mL | Freq: Once | INTRAVENOUS | Status: AC | PRN
  Administered 2018-04-03: 100 mL via INTRAVENOUS

## 2018-04-03 MED ORDER — LEVALBUTEROL TARTRATE 45 MCG/ACT IN AERO: 1.0000 | INHALATION_SPRAY | Freq: Three times a day (TID) | RESPIRATORY_TRACT | 12 refills | Status: AC | PRN

## 2018-04-03 MED ORDER — SODIUM CHLORIDE 0.9% FLUSH
10.0000 mL | INTRAVENOUS | Status: DC | PRN
  Administered 2018-04-03: 10 mL via INTRAVENOUS
  Filled 2018-04-03: qty 10

## 2018-04-03 NOTE — Progress Notes (Signed)
[facility] up:    Marilyn Drafts, FNP [address] [place] [address]   DIAGNOSIS: Stage IV adenocarcinoma of the lung  SUMMARY OF ONCOLOGIC HISTORY: -Concurrent Chemo-radiation with Carboplatin/Taxol. Completed RT on 02/13/2016 and has then completed 2 cycles of carboplatin + taxol. -Started Maintenance Durvalumab from  05/24/2016 q2weeks, switched to Nivolumab q2weeks on 12/13/16 once metastatic disease noted -s/p palliative RT to rt sided Subcutaneous metastatic mass.  Nivolumab q2 weeks  CURRENT THERAPY: Nivolumab  INTERVAL HISTORY: Marilyn [name] 52 y.o. female returns for evaluation prior to receiving her every 2 week Nivolumab.  She is tolerating it well.  She says her pain is for the most part controlled.  She takes percocet if needed.  She says she needs it about three times per week.  She says that her breathing is uncomfortable.  She notes that when she is here the breathing is ok.  She says at home, she has difficulty walking without feeling short of breath and this interferes with her ability to perform ADLs.  She has increased coughing and phlegm.  She feels like this is worsening.  She says that often she will get to coughing so badly she feels like she cannot stop.  She feels like she is wheezing.  She notes occasional chest pain.      Patient Active Problem List   Diagnosis Date Noted  . 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 Counseling regarding advance care planning and goals of care 01/03/2018  . Essential hypertension   . DCM (dilated cardiomyopathy) ([place])   . Non-ischemic cardiomyopathy ([place]) 02/04/2017  . 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 Acute systolic heart failure ([place]) 12/01/2016  . Primary cancer of right lung metastatic to other site ([place]) 11/24/2016  . SOB (shortness of breath) 10/26/2016  . Hypothyroidism 08/22/2016  . PICC (peripherally inserted central catheter)  flush 07/16/2016  . Community acquired pneumonia   . Sinus tachycardia   . Thrush of mouth and esophagus ([place]) 04/07/2016  . Constipation 04/07/2016  . Palliative care by specialist   . Port catheter in place 01/19/2016  . Dehydration 01/14/2016  . Tachycardia 01/14/2016  . Adenocarcinoma of left lung, stage 3 ([place]) 01/01/2016  . Non-small cell lung cancer ([place]) 12/31/2015  . Mediastinal mass   . Chest pain 12/26/2015  . Headache 12/26/2015  . Diabetes mellitus ([place]) 12/26/2015    has No Known Allergies.  MEDICAL HISTORY: Past Medical History:  Diagnosis Date  . Arthritis   . Asthma   . Diabetes mellitus    Type II  . Hemorrhoids   . Hypercholesteremia   . Hypertension   . Hypothyroidism   . Metastatic lung cancer (metastasis from lung to other site) (HCC)    Lung, Mets to skin- right flank. CT angio 2019 suspicious for axillary, external iliac, right inguinal mets  . 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 NICM (nonischemic cardiomyopathy) ([place])    a. mild - EF 45-50% by echo 10/2016 but EF 52% by low risk nuc 11/2016. b. Echo 07/2017 - EF normal, mild AI, unremarkable echo otherwise.  . Obese   . Pneumonia 2017  . Sinus tachycardia    a. dates back to at least 2013, etiology unclear.    SURGICAL HISTORY: Past Surgical History:  Procedure Laterality Date  . CESAREAN SECTION    . COLONOSCOPY    . I/D  arm Right 2013  . IR FLUORO GUIDE PORT INSERTION RIGHT  02/18/2017  . IR GENERIC HISTORICAL  01/08/2016   IR US GUIDE VASC ACCESS RIGHT 01/08/2016 [name], DO [facility]INTER[facility]  . IR GENERIC HISTORICAL  01/08/2016   IR FLUORO GUIDE CV LINE RIGHT 01/08/2016 [name], DO [facility]  . IR US GUIDE VASC ACCESS RIGHT  02/18/2017  . MULTIPLE EXTRACTIONS WITH ALVEOLOPLASTY N/A 03/25/2017   Procedure: MULTIPLE EXTRACTION WITH ALVEOLOPLASTY (teeth #s four, six, seven, eight, nine, ten, eleven, one, twenty-three, twenty-four, twenty-five, twenty-six,  twenty-nine, thirty);  Surgeon: [name], [name];  Location: [facility];  Service: Oral Surgery;  Laterality: N/A;  . TUBAL LIGATION    . US guided core needle biopsy     of right lower neck/supraclavicular lymph nodes    SOCIAL HISTORY: Social History   Socioeconomic History  . Marital status: Divorced    Spouse name: Not on file  . Number of children: Not on file  . Years of education: Not on file  . Highest education level: Not on file  Occupational History  . Not on file  Social Needs  . Financial resource strain: Not on file  . Food insecurity:    Worry: Not on file    Inability: Not on file  . Transportation needs:    Medical: No    Non-medical: No  Tobacco Use  . Smoking status: Never Smoker  . Smokeless tobacco: Never Used  Substance and Sexual Activity  . Alcohol use: No  . Drug use: No  . Sexual activity: Not Currently  Lifestyle  . Physical activity:    Days per week: Not on file    Minutes per session: Not on file  . Stress: Not on file  Relationships  . Social connections:    Talks on phone: Not on file    Gets together: Not on file    Attends religious service: Not on file    Active member of club or organization: Not on file    Attends meetings of clubs or organizations: Not on file    Relationship status: Not on file  . Intimate partner violence:    Fear of current or ex partner: No    Emotionally abused: No    Physically abused: No    Forced sexual activity: No  Other Topics Concern  . Not on file  Social History Narrative   No bird or mold exposure. No recent travel.   11-23-17 Unable to ask abuse questions mother with her today.    FAMILY HISTORY: Family History  Problem Relation Age of Onset  . Heart failure Mother   . Hypertension Mother   . Cancer Neg Hx   . Rheumatologic disease Neg Hx     Review of Systems  Constitutional: Positive for fatigue. Negative for appetite change, chills, fever and unexpected weight change.  HENT:    Negative for hearing loss, lump/mass and trouble swallowing.   Eyes: Negative for eye problems and icterus.  Respiratory: Positive for cough, shortness of breath and wheezing.   Cardiovascular: Positive for chest pain. Negative for leg swelling and palpitations.  Gastrointestinal: Negative for abdominal distention, constipation, diarrhea, nausea and vomiting.  Endocrine: Negative for hot flashes.  Skin: Negative for itching and rash.  Neurological: Negative for dizziness, extremity weakness, light-headedness and numbness.  Hematological: Negative for adenopathy. Does not bruise/bleed easily.  Psychiatric/Behavioral: Negative for depression. The patient is not nervous/anxious.       PHYSICAL EXAMINATION  ECOG  PERFORMANCE STATUS: 2 - Symptomatic, <50% confined to bed  Vitals:   04/03/18 1341  BP: 121/82  Pulse: (!) 135  Resp: 18  Temp: 98.3 F (36.8 C)  SpO2: 97%  Tachypnea with RR of 22  Physical Exam Constitutional:      Comments: Appears slightly short of breath after getting onto exam table  HENT:     Head: Normocephalic and atraumatic.     Mouth/Throat:     Mouth: Mucous membranes are moist.     Pharynx: Oropharynx is clear. No oropharyngeal exudate.  Eyes:     General: No scleral icterus.    Pupils: Pupils are equal, round, and reactive to light.  Cardiovascular:     Rate and Rhythm: Regular rhythm. Tachycardia present.     Heart sounds: Normal heart sounds.  Pulmonary:     Breath sounds: Normal breath sounds. No stridor. No wheezing or rhonchi.     Comments: Slightly tachypneic Abdominal:     General: Bowel sounds are normal. There is no distension.     Palpations: Abdomen is soft.     Tenderness: There is no abdominal tenderness.  Musculoskeletal:        General: No swelling.  Skin:    General: Skin is warm and dry.     Capillary Refill: Capillary refill takes less than 2 seconds.     Findings: No rash.  Neurological:     General: No focal deficit present.      Mental Status: She is alert.  Psychiatric:        Mood and Affect: Mood normal.        Behavior: Behavior normal.     LABORATORY DATA:  CBC    Component Value Date/Time   WBC 6.7 04/03/2018 1229   RBC 3.37 (L) 04/03/2018 1229   HGB 9.9 (L) 04/03/2018 1229   HGB 9.8 (L) 12/26/2017 0913   HGB 9.5 (L) 04/04/2017 0940   HCT 31.0 (L) 04/03/2018 1229   HCT 29.8 (L) 04/04/2017 0940   PLT 256 04/03/2018 1229   PLT 210 12/26/2017 0913   PLT 267 04/04/2017 0940   MCV 92.0 04/03/2018 1229   MCV 88.7 04/04/2017 0940   MCH 29.4 04/03/2018 1229   MCHC 31.9 04/03/2018 1229   RDW 12.8 04/03/2018 1229   RDW 12.8 04/04/2017 0940   LYMPHSABS 0.8 04/03/2018 1229   LYMPHSABS 1.1 04/04/2017 0940   MONOABS 0.5 04/03/2018 1229   MONOABS 0.4 04/04/2017 0940   EOSABS 0.5 04/03/2018 1229   EOSABS 0.3 04/04/2017 0940   BASOSABS 0.0 04/03/2018 1229   BASOSABS 0.0 04/04/2017 0940    CMP     Component Value Date/Time   NA 138 04/03/2018 1229   NA 144 04/04/2017 0940   K 3.9 04/03/2018 1229   K 3.6 04/04/2017 0940   CL 102 04/03/2018 1229   CO2 25 04/03/2018 1229   CO2 29 04/04/2017 0940   GLUCOSE 282 (H) 04/03/2018 1229   GLUCOSE 111 04/04/2017 0940   BUN 11 04/03/2018 1229   BUN 4.9 (L) 04/04/2017 0940   CREATININE 0.81 04/03/2018 1229   CREATININE 0.8 04/04/2017 0940   CALCIUM 9.1 04/03/2018 1229   CALCIUM 8.8 04/04/2017 0940   PROT 7.7 04/03/2018 1229   PROT 7.1 04/04/2017 0940   ALBUMIN 3.1 (L) 04/03/2018 1229   ALBUMIN 3.0 (L) 04/04/2017 0940   AST 13 (L) 04/03/2018 1229   AST 13 04/04/2017 0940   ALT 10 04/03/2018 1229   ALT 10 04/04/2017  0940   ALKPHOS 95 04/03/2018 1229   ALKPHOS 96 04/04/2017 0940   BILITOT 0.2 (L) 04/03/2018 1229   BILITOT 0.36 04/04/2017 0940   GFRNONAA >60 04/03/2018 1229   GFRAA >60 04/03/2018 1229           ASSESSMENT and THERAPY PLAN:   Non-small cell lung cancer ([place]) Marilyn [name] is a 52 year old woman with metastatic NSCLC here today  for evaluation prior to receiving Nivolumab.  She has grade 2 dyspnea, and I am concerned about the potential for a couple of different things related to this.    1. Potential Pneumonitis from nivolumab 2. PE 3. Allergy related dyspnea  I sent Jillyn for a stat chest xray and reviewed it personally with the radiologist, Dr. Clovis Riley.  She has no evidence for pneumonitis, however he notes that early pneumonitis will not be detectable on chest x ray.  We will get CTA to r/u PE.  We will hold Nivolumab today.  I sent in an inhaler for her to use if needed for any wheezing.    She and I reviewed her shortness of breath and she knows that if it worsens she needs to go to the Emergency Room.   I reviewed this patient and plan of care in detail with Dr. Lindi Adie who helped to formulate the plan.   Orders Placed This Encounter  Procedures  . DG Chest 2 View    Standing Status:   Future    Number of Occurrences:   1    Standing Expiration Date:   04/03/2019    Order Specific Question:   Reason for Exam (SYMPTOM  OR DIAGNOSIS REQUIRED)    Answer:   shortness of breath    Order Specific Question:   Is patient pregnant?    Answer:   No    Order Specific Question:   Preferred imaging location?    Answer:   [facility]    Order Specific Question:   Radiology Contrast Protocol - do NOT remove file path    Answer:   \\[email or web address].pdf  . CT ANGIO CHEST PE W OR WO CONTRAST    Patient can leave after scan per provider    Standing Status:   Future    Number of Occurrences:   1    Standing Expiration Date:   07/03/2019    Order Specific Question:   ** REASON FOR EXAM (FREE TEXT)    Answer:   metastatic lung cancer progressive shortness of breath, on immunotherapy    Order Specific Question:   If indicated for the ordered procedure, I authorize the administration of contrast media per Radiology protocol    Answer:   Yes    Order Specific Question:   Is patient  pregnant?    Answer:   No    Order Specific Question:   Preferred imaging location?    Answer:   [facility]    Order Specific Question:   Call Results- Best Contact Number?    Answer:   [phone] /     Order Specific Question:   Radiology Contrast Protocol - do NOT remove file path    Answer:   \\[email or web address]\[email or web address]\CTProtocols.pdf    All questions were answered. The patient knows to call the clinic with any problems, questions or concerns. We can certainly see the patient much sooner if necessary.  A total of (30) minutes of face-to-face time was spent with this patient with greater than 50% of that time in counseling  and care-coordination.  This note was electronically signed. Scot Dock, NP 04/03/2018

## 2018-04-03 NOTE — Assessment & Plan Note (Addendum)
Kadian is a 52 year old woman with metastatic NSCLC here today for evaluation prior to receiving Nivolumab.  She has grade 2 dyspnea, and I am concerned about the potential for a couple of different things related to this.    1. Potential Pneumonitis from nivolumab 2. PE 3. Allergy related dyspnea  I sent [place] for a stat chest xray and reviewed it personally with the radiologist, Dr. Clovis Riley.  She has no evidence for pneumonitis, however he notes that early pneumonitis will not be detectable on chest x ray.  We will get CTA to r/u PE.  We will hold Nivolumab today.  I sent in an inhaler for her to use if needed for any wheezing.    She and I reviewed her shortness of breath and she knows that if it worsens she needs to go to the Emergency Room.   I reviewed this patient and plan of care in detail with Dr. Lindi Adie who helped to formulate the plan.

## 2018-04-04 ENCOUNTER — Telehealth: Payer: Self-pay | Admitting: Adult Health

## 2018-04-04 NOTE — Telephone Encounter (Signed)
Per 12/30 no los

## 2018-04-08 IMAGING — CT CT CHEST W/ CM
2 of 6 series · 14 of 36 positions shown, 17 images · IV contrast (ISOVUE 300)
Comparison: The heart size is normal. CT chest from 01/02/2017. CT
CAP from 10/29/2016.

CLINICAL DATA: Followup lung cancer.  Status post chemo and XRT.

EXAM:
CT CHEST, ABDOMEN, AND PELVIS WITH CONTRAST
TECHNIQUE: Multidetector CT imaging of the chest, abdomen and pelvis was
performed following the standard protocol during bolus
administration of intravenous contrast.
CONTRAST:  100mL YKVKK6-[id number] IOPAMIDOL (YKVKK6-VVV) INJECTION 61%

[Series 2: cap with · axial · 0.91mm/px · z∈[+1175,+1725]mm · 11 of 133 slices shown, 14 images]
[im 12/133  mediastinal]
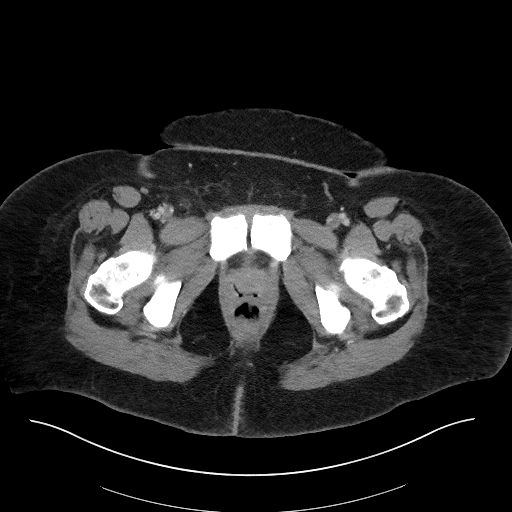
[im 12/133  lung]
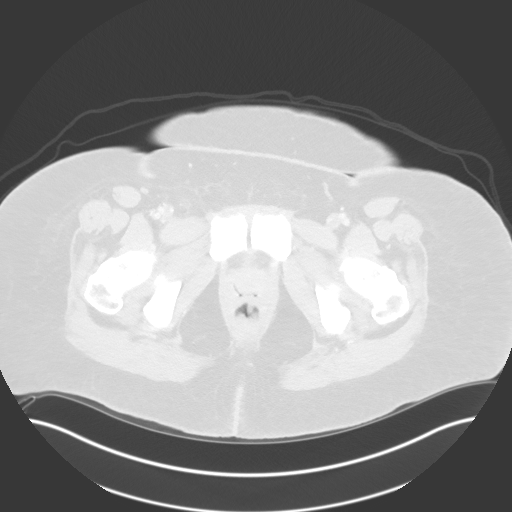
[im 23/133  lung]
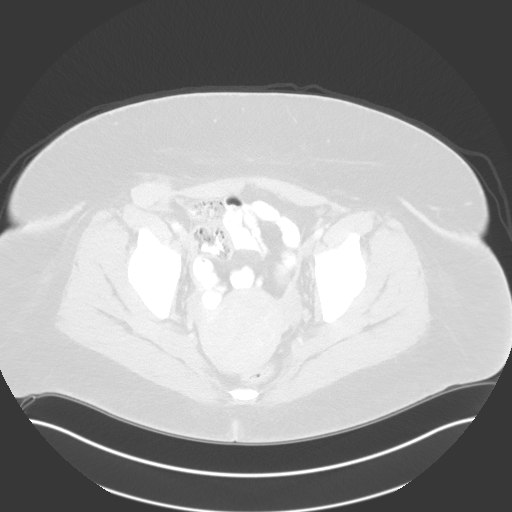
[im 34/133  lung]
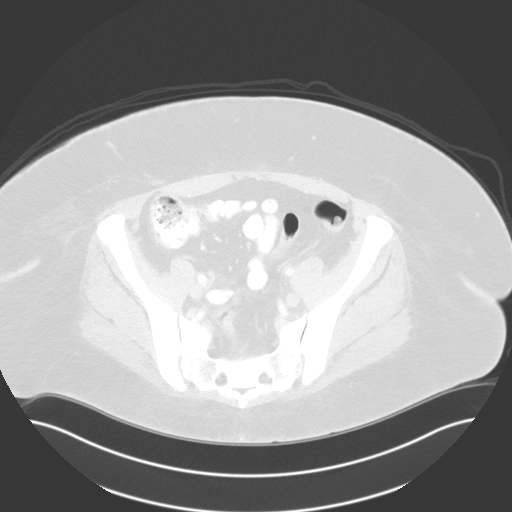
[im 45/133  lung]
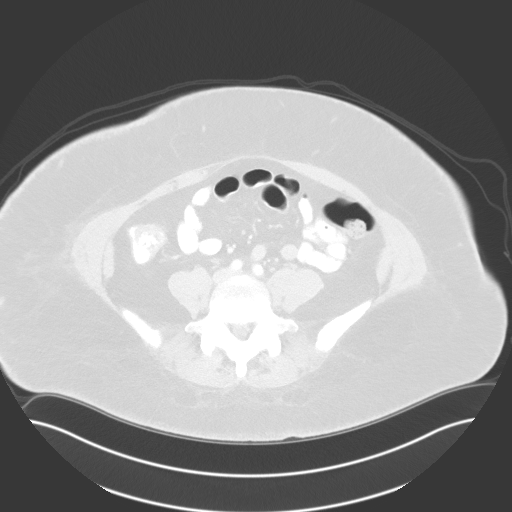
[im 56/133  mediastinal]
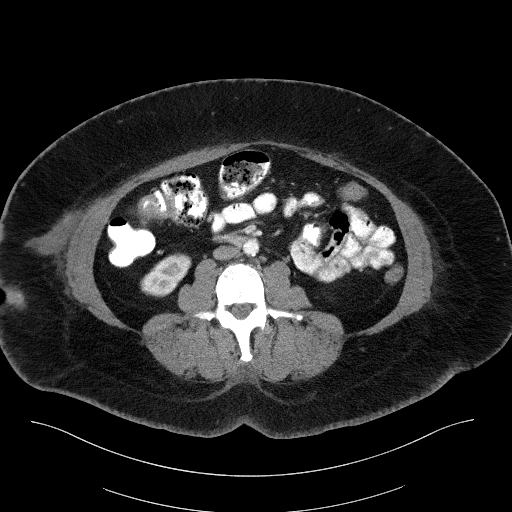
[im 56/133  lung]
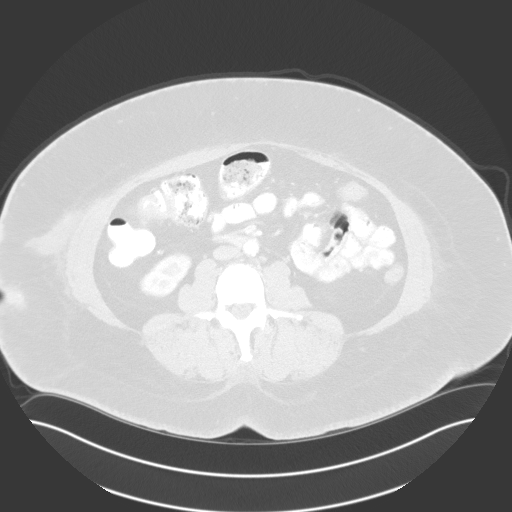
[im 67/133  lung]
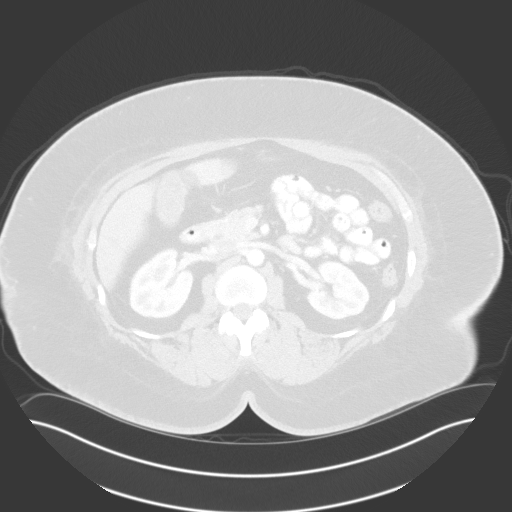
[im 78/133  lung]
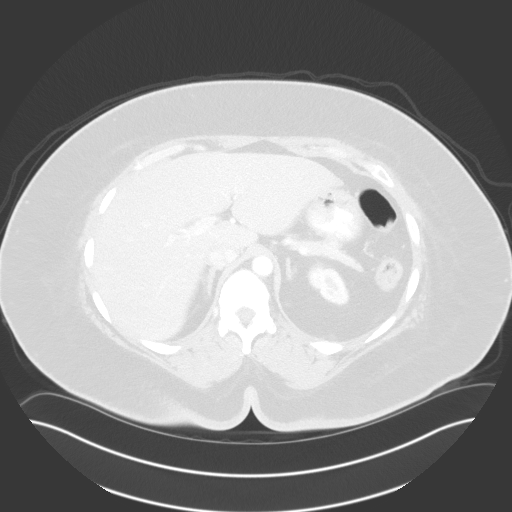
[im 89/133  lung]
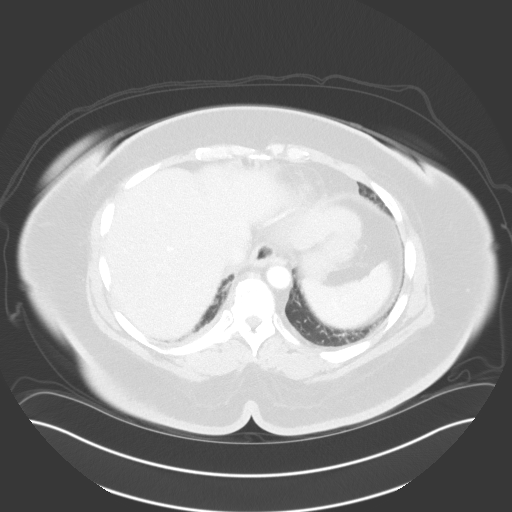
[im 100/133  mediastinal]
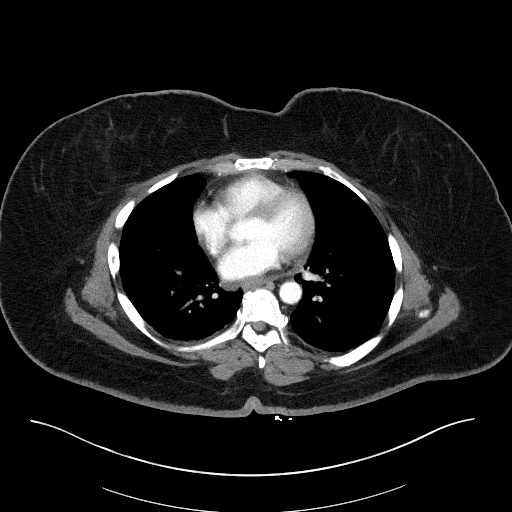
[im 100/133  lung]
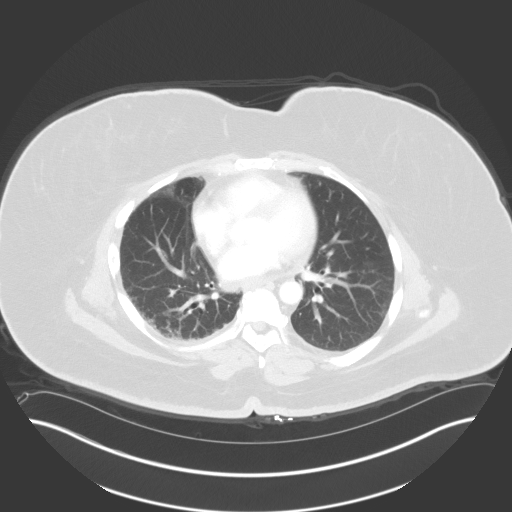
[im 111/133  lung]
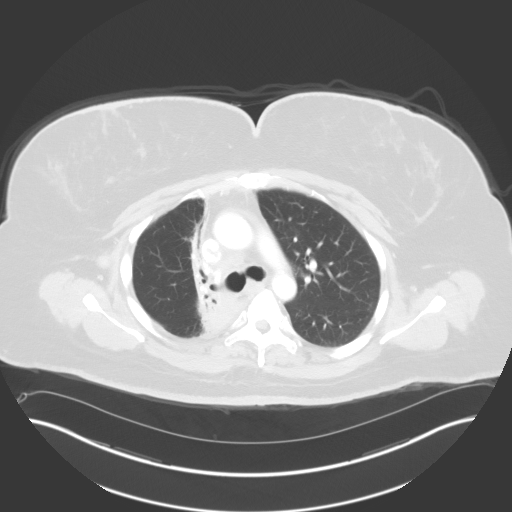
[im 122/133  lung]
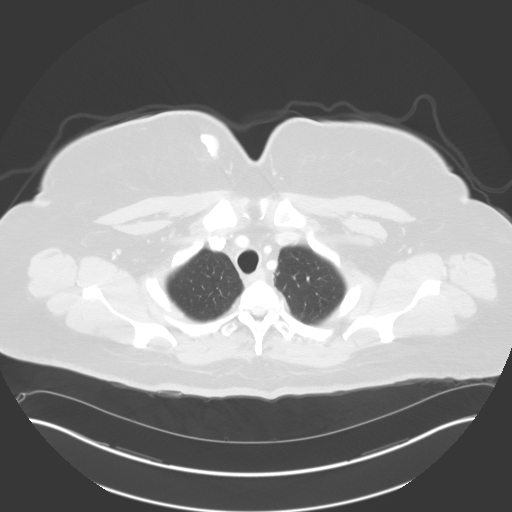

[Series 5: coronals · coronal · 0.94mm/px · 3 of 167 slices shown]
[im 34/167  lung]
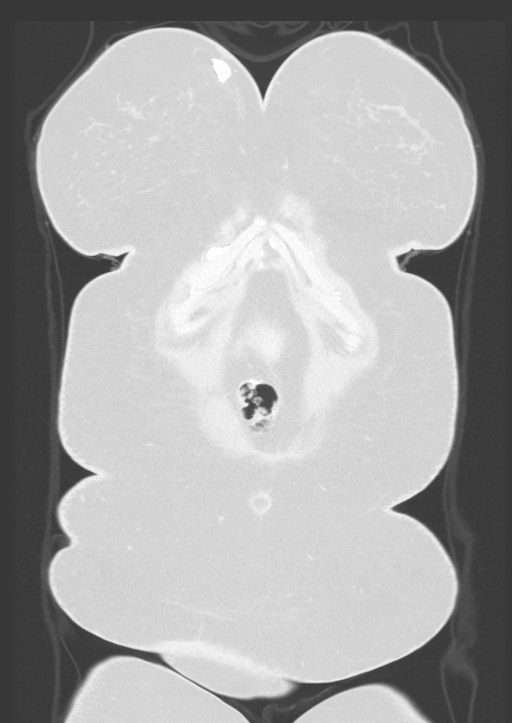
[im 67/167  lung]
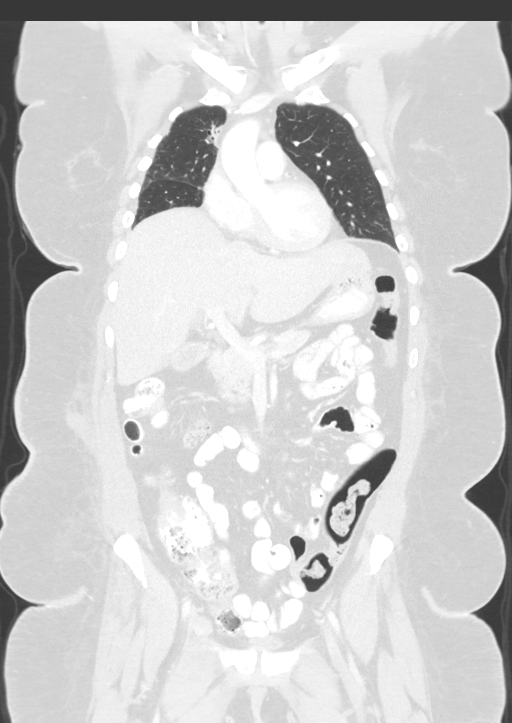
[im 100/167  lung]
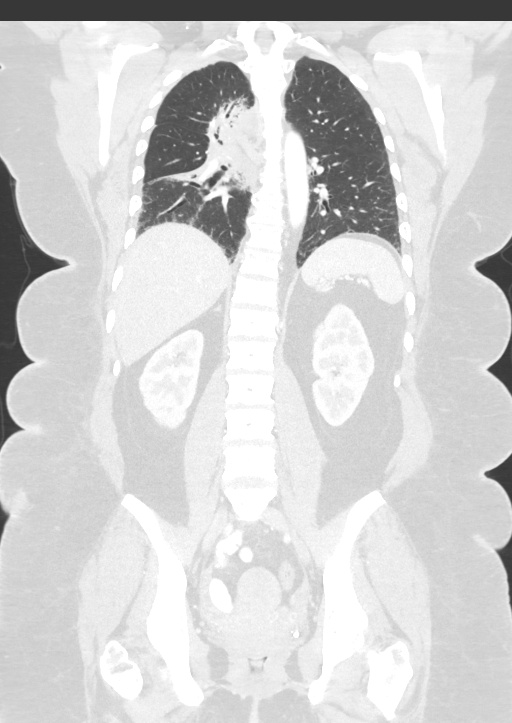

[14 of 36 positions shown; findings below may reference images not displayed]

FINDINGS: CT CHEST FINDINGS

Cardiovascular: Normal heart size. No pericardial effusion
identified.

Mediastinum/Nodes: The trachea appears patent and is midline. Normal
appearance of the esophagus. Right paratracheal soft tissue mass
measures 2.0 x 2.5 cm, image 21 of series 2. Previously 2.5 x
cm. No hilar adenopathy. No enlarged supraclavicular lymph nodes.
There is a new enlarged right axillary node which measures 2.1 cm,
image 20 of series 2.

Lungs/Pleura: Trace right pleural effusion. Masslike architectural
distortion and fibrosis within the paramediastinal right lung is
similar to previous exam. There is a small pulmonary nodule within
the posterior left lower lobe which is new from previous exam
measuring 6 mm.

Musculoskeletal: No suspicious bone lesions.

CT ABDOMEN PELVIS FINDINGS

Hepatobiliary: Multiple stones are identified within the
gallbladder. No biliary dilatation.

Pancreas: The pancreas is normal.

Spleen: The spleen is normal.

Adrenals/Urinary Tract: Normal appearance of both adrenal glands.
The kidneys are unremarkable. The urinary bladder is normal.

Stomach/Bowel: The stomach is unremarkable. The small bowel loops
have a normal course and caliber. No obstruction. The appendix is
visualized and appears normal. Normal appearance of the colon.

Vascular/Lymphatic: Normal appearance of the abdominal aorta. No
enlarged retroperitoneal or mesenteric adenopathy. Enlarged right
external iliac lymph node measures 1.6 cm and is new from the
previous exam. Large right inguinal nodal mass measures 4.3 by
cm, image 113 of series 7.

Reproductive: Uterine fibroids identified.  No adnexal mass.

Other: There is no free fluid or fluid collections within the
abdomen or pelvis. The right lateral body wall soft tissue mass
identified on study from 10/29/2016 is decreased in size measuring
4.7 x 2.4 cm with decreased enhancement. Previously 5.0 x 4.7 cm.

Musculoskeletal: No aggressive lytic or sclerotic bone lesions
identified.
IMPRESSION: 1. Interval development of right axillary, right external iliac and
right inguinal adenopathy. Suspicious for nodal metastasis.
2. Decrease in size of right paratracheal adenopathy.
3. Decrease in size and enhancement associated with right lateral
body wall lesion.
4. New small nonspecific pulmonary nodule in the left lower lobe
measuring 6 mm.
5. Stable appearance of changes secondary to external beam radiation
within the right lung.

## 2018-04-15 ENCOUNTER — Other Ambulatory Visit: Payer: Self-pay | Admitting: Hematology

## 2018-04-17 ENCOUNTER — Inpatient Hospital Stay: Payer: Medicaid Other

## 2018-04-17 ENCOUNTER — Inpatient Hospital Stay: Payer: Medicaid Other | Attending: Hematology

## 2018-04-17 VITALS — BP 124/77 | HR 100 | Temp 98.0°F | Resp 18

## 2018-04-17 DIAGNOSIS — G893 Neoplasm related pain (acute) (chronic): Secondary | ICD-10-CM | POA: Insufficient documentation

## 2018-04-17 DIAGNOSIS — E7800 Pure hypercholesterolemia, unspecified: Secondary | ICD-10-CM | POA: Insufficient documentation

## 2018-04-17 DIAGNOSIS — E039 Hypothyroidism, unspecified: Secondary | ICD-10-CM | POA: Diagnosis not present

## 2018-04-17 DIAGNOSIS — Z7189 Other specified counseling: Secondary | ICD-10-CM

## 2018-04-17 DIAGNOSIS — I10 Essential (primary) hypertension: Secondary | ICD-10-CM | POA: Diagnosis not present

## 2018-04-17 DIAGNOSIS — R5383 Other fatigue: Secondary | ICD-10-CM | POA: Diagnosis not present

## 2018-04-17 DIAGNOSIS — M129 Arthropathy, unspecified: Secondary | ICD-10-CM | POA: Diagnosis not present

## 2018-04-17 DIAGNOSIS — C3491 Malignant neoplasm of unspecified part of right bronchus or lung: Secondary | ICD-10-CM

## 2018-04-17 DIAGNOSIS — J45909 Unspecified asthma, uncomplicated: Secondary | ICD-10-CM | POA: Insufficient documentation

## 2018-04-17 DIAGNOSIS — G4700 Insomnia, unspecified: Secondary | ICD-10-CM | POA: Insufficient documentation

## 2018-04-17 DIAGNOSIS — Z9221 Personal history of antineoplastic chemotherapy: Secondary | ICD-10-CM | POA: Diagnosis not present

## 2018-04-17 DIAGNOSIS — Z95828 Presence of other vascular implants and grafts: Secondary | ICD-10-CM

## 2018-04-17 DIAGNOSIS — E119 Type 2 diabetes mellitus without complications: Secondary | ICD-10-CM | POA: Diagnosis not present

## 2018-04-17 DIAGNOSIS — R000 Tachycardia, unspecified: Secondary | ICD-10-CM | POA: Insufficient documentation

## 2018-04-17 DIAGNOSIS — Z923 Personal history of irradiation: Secondary | ICD-10-CM | POA: Insufficient documentation

## 2018-04-17 DIAGNOSIS — C792 Secondary malignant neoplasm of skin: Secondary | ICD-10-CM

## 2018-04-17 DIAGNOSIS — R531 Weakness: Secondary | ICD-10-CM | POA: Insufficient documentation

## 2018-04-17 DIAGNOSIS — Z5112 Encounter for antineoplastic immunotherapy: Secondary | ICD-10-CM | POA: Diagnosis not present

## 2018-04-17 DIAGNOSIS — G629 Polyneuropathy, unspecified: Secondary | ICD-10-CM | POA: Insufficient documentation

## 2018-04-17 DIAGNOSIS — Z452 Encounter for adjustment and management of vascular access device: Secondary | ICD-10-CM

## 2018-04-17 LAB — CBC WITH DIFFERENTIAL/PLATELET
Abs Immature Granulocytes: 0.02 10*3/uL (ref 0.00–0.07)
Basophils Absolute: 0 10*3/uL (ref 0.0–0.1)
Basophils Relative: 0 %
Eosinophils Absolute: 0.4 10*3/uL (ref 0.0–0.5)
Eosinophils Relative: 7 %
HCT: 29.5 % — ABNORMAL LOW (ref 36.0–46.0)
Hemoglobin: 9.6 g/dL — ABNORMAL LOW (ref 12.0–15.0)
Immature Granulocytes: 0 %
Lymphocytes Relative: 12 %
Lymphs Abs: 0.7 10*3/uL (ref 0.7–4.0)
MCH: 29.4 pg (ref 26.0–34.0)
MCHC: 32.5 g/dL (ref 30.0–36.0)
MCV: 90.5 fL (ref 80.0–100.0)
Monocytes Absolute: 0.3 10*3/uL (ref 0.1–1.0)
Monocytes Relative: 6 %
Neutro Abs: 4 10*3/uL (ref 1.7–7.7)
Neutrophils Relative %: 75 %
Platelets: 239 10*3/uL (ref 150–400)
RBC: 3.26 MIL/uL — ABNORMAL LOW (ref 3.87–5.11)
RDW: 12.4 % (ref 11.5–15.5)
WBC: 5.4 10*3/uL (ref 4.0–10.5)
nRBC: 0 % (ref 0.0–0.2)

## 2018-04-17 LAB — CMP (CANCER CENTER ONLY)
ALBUMIN: 2.9 g/dL — AB (ref 3.5–5.0)
ALT: 10 U/L (ref 0–44)
AST: 10 U/L — ABNORMAL LOW (ref 15–41)
Alkaline Phosphatase: 101 U/L (ref 38–126)
Anion gap: 8 (ref 5–15)
BUN: 9 mg/dL (ref 6–20)
CO2: 29 mmol/L (ref 22–32)
Calcium: 8.7 mg/dL — ABNORMAL LOW (ref 8.9–10.3)
Chloride: 100 mmol/L (ref 98–111)
Creatinine: 0.79 mg/dL (ref 0.44–1.00)
GFR, Est AFR Am: >60 mL/min (ref >60)
GFR, Estimated: >60 mL/min (ref >60)
GLUCOSE: 327 mg/dL — AB (ref 70–99)
Potassium: 3.8 mmol/L (ref 3.5–5.1)
Sodium: 137 mmol/L (ref 135–145)
Total Bilirubin: 0.3 mg/dL (ref 0.3–1.2)
Total Protein: 7.3 g/dL (ref 6.5–8.1)

## 2018-04-17 MED ORDER — SODIUM CHLORIDE 0.9 % IV SOLN
Freq: Once | INTRAVENOUS | Status: AC
  Administered 2018-04-17: 15:00:00 via INTRAVENOUS
  Filled 2018-04-17: qty 250

## 2018-04-17 MED ORDER — HEPARIN SOD (PORK) LOCK FLUSH 100 UNIT/ML IV SOLN
500.0000 [IU] | Freq: Once | INTRAVENOUS | Status: AC | PRN
  Administered 2018-04-17: 500 [IU]
  Filled 2018-04-17: qty 5

## 2018-04-17 MED ORDER — SODIUM CHLORIDE 0.9 % IV SOLN
240.0000 mg | Freq: Once | INTRAVENOUS | Status: AC
  Administered 2018-04-17: 240 mg via INTRAVENOUS
  Filled 2018-04-17: qty 24

## 2018-04-17 MED ORDER — SODIUM CHLORIDE 0.9 % IJ SOLN
10.0000 mL | INTRAMUSCULAR | Status: DC | PRN
  Administered 2018-04-17: 10 mL via INTRAVENOUS
  Filled 2018-04-17: qty 10

## 2018-04-17 MED ORDER — SODIUM CHLORIDE 0.9% FLUSH
10.0000 mL | INTRAVENOUS | Status: DC | PRN
  Administered 2018-04-17: 10 mL
  Filled 2018-04-17: qty 10

## 2018-04-17 NOTE — Progress Notes (Signed)
Spoke w/ Dr. Irene Limbo, okay to treat today in setting of recent shortness of breath symptoms. Pt is no longer having symptoms/not requiring inhaler and CT did not show concern for pneumonitis.   He will get her scheduled for more MD, lab, and infusion appts.  Demetrius Charity, PharmD, [facility] Pharmacist Pharmacy Phone: [phone] 04/17/2018

## 2018-04-17 NOTE — Patient Instructions (Signed)
[facility] Discharge Instructions for Patients Receiving Chemotherapy  Today you received the following chemotherapy agents Nivolumab (OPDIVO).  To help prevent nausea and vomiting after your treatment, we encourage you to take your nausea medication as prescribed.   If you develop nausea and vomiting that is not controlled by your nausea medication, call the clinic.   BELOW ARE SYMPTOMS THAT SHOULD BE REPORTED IMMEDIATELY:  *FEVER GREATER THAN 100.5 F  *CHILLS WITH OR WITHOUT FEVER  NAUSEA AND VOMITING THAT IS NOT CONTROLLED WITH YOUR NAUSEA MEDICATION  *UNUSUAL SHORTNESS OF BREATH  *UNUSUAL BRUISING OR BLEEDING  TENDERNESS IN MOUTH AND THROAT WITH OR WITHOUT PRESENCE OF ULCERS  *URINARY PROBLEMS  *BOWEL PROBLEMS  UNUSUAL RASH Items with * indicate a potential emergency and should be followed up as soon as possible.  Feel free to call the clinic should you have any questions or concerns. The clinic phone number is [phone].  Please show the CHEMO ALERT CARD at check-in to the Emergency Department and triage nurse.   

## 2018-04-18 ENCOUNTER — Telehealth: Payer: Self-pay | Admitting: Hematology

## 2018-04-18 NOTE — Telephone Encounter (Signed)
Left message re 1/28 appointments. Schedule mailed. Also spoke with desk nurse regarding 1/14 schedule message - scheduling communication - check out process and the need to sending updated orders/request when patient is seen.

## 2018-04-18 NOTE — Telephone Encounter (Signed)
Patient of Dr. Irene Limbo

## 2018-04-21 ENCOUNTER — Other Ambulatory Visit: Payer: Self-pay | Admitting: Hematology

## 2018-04-21 ENCOUNTER — Encounter: Payer: Self-pay | Admitting: Radiation Oncology

## 2018-04-21 NOTE — Progress Notes (Signed)
  Radiation Oncology         [phone] ________________________________  Name: Marilyn [name] MRN: [id number]  Date: 04/21/2018  DOB: 04/18/1965  End of Treatment Note  Diagnosis:   53 y.o. woman with right axillary metastases from the right lung   Indication for treatment:  Palliative       Radiation treatment dates:   03/20/2018 - 04/03/2018  Site/dose:   Axilla, Right / 30 Gy in 10 fractions of 3 Gy  Beams/energy:   3D, photons / 15X  Narrative: The patient tolerated radiation treatment relatively well. She denied any pain and fatigue.  Plan: The patient has completed radiation treatment. The patient will return to radiation oncology clinic for routine followup in one month. I advised her to call or return sooner if she has any questions or concerns related to her recovery or treatment. ________________________________  Sheral Apley. Tammi Klippel, M.D.   This document serves as a record of services personally performed by Tyler Pita, MD. It was created on his behalf by Wilburn Mylar, a trained medical scribe. The creation of this record is based on the scribe's personal observations and the provider's statements to them. This document has been checked and approved by the attending provider.

## 2018-04-26 ENCOUNTER — Other Ambulatory Visit: Payer: Self-pay | Admitting: Hematology

## 2018-04-26 MED ORDER — HYDROXYZINE HCL 10 MG PO TABS: 10.0000 mg | ORAL_TABLET | Freq: Three times a day (TID) | ORAL | 0 refills | Status: AC | PRN

## 2018-04-26 MED ORDER — OXYCODONE-ACETAMINOPHEN 5-325 MG PO TABS: 1.0000 | ORAL_TABLET | Freq: Four times a day (QID) | ORAL | 0 refills | Status: DC | PRN

## 2018-04-28 ENCOUNTER — Other Ambulatory Visit: Payer: Self-pay

## 2018-04-28 ENCOUNTER — Observation Stay (HOSPITAL_COMMUNITY)
Admission: EM | Admit: 2018-04-28 | Discharge: 2018-04-30 | Disposition: A | Discharge status: Home / Self Care | Payer: Medicaid Other | Attending: Family Medicine | Admitting: Family Medicine

## 2018-04-28 ENCOUNTER — Emergency Department (HOSPITAL_COMMUNITY): Payer: Medicaid Other

## 2018-04-28 ENCOUNTER — Encounter (HOSPITAL_COMMUNITY): Payer: Self-pay

## 2018-04-28 DIAGNOSIS — R29898 Other symptoms and signs involving the musculoskeletal system: Secondary | ICD-10-CM | POA: Diagnosis present

## 2018-04-28 DIAGNOSIS — Z794 Long term (current) use of insulin: Secondary | ICD-10-CM | POA: Diagnosis not present

## 2018-04-28 DIAGNOSIS — C773 Secondary and unspecified malignant neoplasm of axilla and upper limb lymph nodes: Secondary | ICD-10-CM | POA: Diagnosis not present

## 2018-04-28 DIAGNOSIS — F32A Depression, unspecified: Secondary | ICD-10-CM | POA: Diagnosis present

## 2018-04-28 DIAGNOSIS — F329 Major depressive disorder, single episode, unspecified: Secondary | ICD-10-CM | POA: Diagnosis not present

## 2018-04-28 DIAGNOSIS — E119 Type 2 diabetes mellitus without complications: Secondary | ICD-10-CM

## 2018-04-28 DIAGNOSIS — N390 Urinary tract infection, site not specified: Secondary | ICD-10-CM | POA: Diagnosis present

## 2018-04-28 DIAGNOSIS — M4802 Spinal stenosis, cervical region: Principal | ICD-10-CM | POA: Diagnosis present

## 2018-04-28 DIAGNOSIS — R200 Anesthesia of skin: Secondary | ICD-10-CM | POA: Diagnosis present

## 2018-04-28 DIAGNOSIS — E039 Hypothyroidism, unspecified: Secondary | ICD-10-CM | POA: Diagnosis not present

## 2018-04-28 DIAGNOSIS — C3492 Malignant neoplasm of unspecified part of left bronchus or lung: Secondary | ICD-10-CM | POA: Diagnosis not present

## 2018-04-28 DIAGNOSIS — I110 Hypertensive heart disease with heart failure: Secondary | ICD-10-CM | POA: Diagnosis not present

## 2018-04-28 DIAGNOSIS — E785 Hyperlipidemia, unspecified: Secondary | ICD-10-CM | POA: Diagnosis not present

## 2018-04-28 DIAGNOSIS — I10 Essential (primary) hypertension: Secondary | ICD-10-CM | POA: Diagnosis present

## 2018-04-28 DIAGNOSIS — E669 Obesity, unspecified: Secondary | ICD-10-CM | POA: Diagnosis not present

## 2018-04-28 DIAGNOSIS — I5032 Chronic diastolic (congestive) heart failure: Secondary | ICD-10-CM | POA: Diagnosis present

## 2018-04-28 DIAGNOSIS — J45909 Unspecified asthma, uncomplicated: Secondary | ICD-10-CM | POA: Diagnosis not present

## 2018-04-28 DIAGNOSIS — Z79899 Other long term (current) drug therapy: Secondary | ICD-10-CM | POA: Insufficient documentation

## 2018-04-28 LAB — DIFFERENTIAL
Abs Immature Granulocytes: 0.01 10*3/uL (ref 0.00–0.07)
Basophils Absolute: 0 10*3/uL (ref 0.0–0.1)
Basophils Relative: 0 %
Eosinophils Absolute: 0.3 10*3/uL (ref 0.0–0.5)
Eosinophils Relative: 5 %
Immature Granulocytes: 0 %
Lymphocytes Relative: 11 %
Lymphs Abs: 0.6 10*3/uL — ABNORMAL LOW (ref 0.7–4.0)
Monocytes Absolute: 0.3 10*3/uL (ref 0.1–1.0)
Monocytes Relative: 6 %
NEUTROS ABS: 4.6 10*3/uL (ref 1.7–7.7)
NEUTROS PCT: 78 %

## 2018-04-28 LAB — CBC
HCT: 29.7 % — ABNORMAL LOW (ref 36.0–46.0)
HEMOGLOBIN: 9.1 g/dL — AB (ref 12.0–15.0)
MCH: 29 pg (ref 26.0–34.0)
MCHC: 30.6 g/dL (ref 30.0–36.0)
MCV: 94.6 fL (ref 80.0–100.0)
Platelets: 246 10*3/uL (ref 150–400)
RBC: 3.14 MIL/uL — ABNORMAL LOW (ref 3.87–5.11)
RDW: 12.7 % (ref 11.5–15.5)
WBC: 5.9 10*3/uL (ref 4.0–10.5)
nRBC: 0 % (ref 0.0–0.2)

## 2018-04-28 LAB — PROTIME-INR
INR: 1.04
Prothrombin Time: 13.5 seconds (ref 11.4–15.2)

## 2018-04-28 LAB — I-STAT BETA HCG BLOOD, ED (MC, WL, AP ONLY)

## 2018-04-28 LAB — I-STAT TROPONIN, ED: Troponin i, poc: 0 ng/mL (ref 0.00–0.08)

## 2018-04-28 LAB — APTT: aPTT: 30 seconds (ref 24–36)

## 2018-04-28 LAB — CBG MONITORING, ED: Glucose-Capillary: 270 mg/dL — ABNORMAL HIGH (ref 70–99)

## 2018-04-28 MED ORDER — SODIUM CHLORIDE 0.9% FLUSH
10.0000 mL | INTRAVENOUS | Status: DC | PRN
  Administered 2018-04-30: 10 mL
  Filled 2018-04-28: qty 40

## 2018-04-28 NOTE — ED Notes (Signed)
Patient transported to CT 

## 2018-04-28 NOTE — ED Notes (Signed)
Dr. Lita Mains notified of pt's condition.

## 2018-04-28 NOTE — ED Notes (Signed)
Called into to CARELINK@22 :28pm.

## 2018-04-28 NOTE — ED Provider Notes (Signed)
[place] DEPT Provider Note   CSN: [id number] Arrival date & time: 04/28/18  2139     History   Chief Complaint Chief Complaint  Patient presents with  . Numbness  . Cancer Patient    HPI Marilyn [name] is a 53 y.o. female.  HPI Patient with history of metastatic lung cancer presents with acute onset right-sided weakness and numbness.  States that she was in her normal state of health at 7 PM when she laid down to take a nap.  She woke at 8:30 PM with numbness to the right side of her face, right upper extremity and right lower extremity.  She also complains of weakness of the right upper and lower extremities.  States she has been able to walk.  Denies any visual or speech changes. Past Medical History:  Diagnosis Date  . Arthritis   . Asthma   . Diabetes mellitus    Type II  . Hemorrhoids   . Hypercholesteremia   . Hypertension   . Hypothyroidism   . Metastatic lung cancer (metastasis from lung to other site) (HCC)    Lung, Mets to skin- right flank. CT angio 2019 suspicious for axillary, external iliac, right inguinal mets  . 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 NICM (nonischemic cardiomyopathy) ([place])    a. mild - EF 45-50% by echo 10/2016 but EF 52% by low risk nuc 11/2016. b. Echo 07/2017 - EF normal, mild AI, unremarkable echo otherwise.  . Obese   . Pneumonia 2017  . Sinus tachycardia    a. dates back to at least 2013, etiology unclear.    Patient Active Problem List   Diagnosis Date Noted  . HLD (hyperlipidemia) 04/29/2018  . 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 Counseling regarding advance care planning and goals of care 01/03/2018  . Essential hypertension   . DCM (dilated cardiomyopathy) ([place])   . Non-ischemic cardiomyopathy ([place]) 02/04/2017  . Metastasis to skin ([place]) 12/02/2016  . Acute systolic heart failure ([place]) 12/01/2016  . Primary cancer of right lung  metastatic to other site ([place]) 11/24/2016  . SOB (shortness of breath) 10/26/2016  . Hypothyroidism 08/22/2016  . PICC (peripherally inserted central catheter) flush 07/16/2016  . Community acquired pneumonia   . Sinus tachycardia   . Thrush of mouth and esophagus ([place]) 04/07/2016  . Constipation 04/07/2016  . Palliative care by specialist   . Port catheter in place 01/19/2016  . Dehydration 01/14/2016  . Tachycardia 01/14/2016  . Adenocarcinoma of left lung, stage 3 ([place]) 01/01/2016  . Non-small cell lung cancer ([place]) 12/31/2015  . Mediastinal mass   . Chest pain 12/26/2015  . Headache 12/26/2015  . Diabetes mellitus ([place]) 12/26/2015    Past Surgical History:  Procedure Laterality Date  . CESAREAN SECTION    . COLONOSCOPY    . I/D  arm Right 2013  . IR FLUORO GUIDE PORT INSERTION RIGHT  02/18/2017  . IR GENERIC HISTORICAL  01/08/2016   IR US GUIDE VASC ACCESS RIGHT 01/08/2016 [name], DO [facility]-INTERV RAD  . IR GENERIC HISTORICAL  01/08/2016   IR FLUORO GUIDE CV LINE RIGHT 01/08/2016 [name], DO [facility]-INTER[facility] RAD  . IR US GUIDE VASC ACCESS RIGHT  02/18/2017  . MULTIPLE EXTRACTIONS WITH ALVEOLOPLASTY N/A 03/25/2017   Procedure: MULTIPLE EXTRACTION WITH ALVEOLOPLASTY (teeth #s four, six, seven, eight, nine, ten, eleven, one, twenty-three, twenty-four, twenty-five, twenty-six, twenty-nine, thirty);  Surgeon: Hoyt Koch,  Nicki Reaper, [name];  Location: [facility];  Service: Oral Surgery;  Laterality: N/A;  . TUBAL LIGATION    . US guided core needle biopsy     of right lower neck/supraclavicular lymph nodes     OB History   No obstetric history on file.      Home Medications    Prior to Admission medications   Medication Sig Start Date End Date Taking? Authorizing Provider  ASPIRIN LOW DOSE 81 MG EC tablet TAKE 1 TABLET BY MOUTH EVERY DAY Patient taking differently: Take 81 mg by mouth daily. Enteric Coated.  ** DO NOT CRUSH ** 03/19/18  Yes [name], MD  DULoxetine (CYMBALTA)  60 MG capsule TAKE 1 CAPSULE BY MOUTH EVERY DAY Patient taking differently: Take 60 mg by mouth daily.  12/13/17  Yes [name], MD  hydrOXYzine (ATARAX/VISTARIL) 10 MG tablet Take 1 tablet (10 mg total) by mouth 3 (three) times daily as needed for itching. 04/26/18  Yes [name], MD  insulin aspart (NOVOLOG) 100 UNIT/ML FlexPen As per sliding scale instructions Patient taking differently: Inject 0-10 Units into the skin 3 (three) times daily. As per sliding scale instructions 01/16/16  Yes [name], MD  Insulin Degludec (TRESIBA FLEXTOUCH) 200 UNIT/ML SOPN Inject 100 Units into the skin daily before breakfast.    Yes [provider]  levothyroxine (SYNTHROID, LEVOTHROID) 150 MCG tablet TAKE 1 TABLET BY MOUTH EVERY DAY BEFORE BREAKFAST Patient taking differently: Take 150 mcg by mouth daily before breakfast.  03/19/18  Yes [name], MD  lidocaine-prilocaine (EMLA) cream Apply 1 application topically as needed. 08/08/17  Yes [name], MD  metoprolol succinate (TOPROL-XL) 50 MG 24 hr tablet Take 1 tablet (50mg ) by mouth in the morning and 1/2 tablet (25mg ) in the evening. Take with or immediately following a meal. Patient taking differently: Take 25-50 mg by mouth See admin instructions. Take 1 tablet (50mg ) by mouth in the morning and 1/2 tablet (25mg ) in the evening. Take with or immediately following a meal. 01/17/18  Yes [name], [name], MD  oxyCODONE-acetaminophen (PERCOCET) 5-325 MG tablet Take 1 tablet by mouth every 6 (six) hours as needed for moderate pain or severe pain. 04/26/18  Yes [name], MD  pregabalin (LYRICA) 50 MG capsule Take 50 mg by mouth 3 (three) times daily.   Yes [provider]  senna-docusate (SENNA S) 8.6-50 MG tablet Take 2 tablets by mouth 2 (two) times daily. May reduce to 2 tab po HS as needed once regular BM estabished Patient taking differently: Take 2 tablets by mouth 2 (two) times  daily as needed (FOR CONSTIPATION). = 05/24/16  Yes [name], MD  Vitamins A & D (VITAMIN A & D) OINT Apply 1 application topically 2 (two) times daily. 03/06/18  Yes [name], MD  fluconazole (DIFLUCAN) 200 MG tablet Take 1 tablet (200 mg total) by mouth daily. Patient not taking: Reported on 04/28/2018 03/06/18   [name], MD  levalbuterol [facility] HFA) 45 MCG/ACT inhaler Inhale 1-2 puffs into the lungs every 8 (eight) hours as needed for wheezing. 04/03/18   Gardenia Phlegm, NP  levofloxacin (LEVAQUIN) 500 MG tablet Take 1 tablet (500 mg total) by mouth daily. Patient not taking: Reported on 04/28/2018 03/06/18   [name], MD  naproxen (NAPROSYN) 500 MG tablet Take 1 tablet (500 mg total) by mouth 2 (two) times daily with a meal. Patient not taking: Reported on 04/28/2018  06/21/17   Varney Biles, MD  traZODone (DESYREL) 50 MG tablet Take 1-2 tablets (50-100 mg total) by mouth at bedtime as needed for sleep. Patient not taking: Reported on 04/28/2018 05/16/17   [name], MD  prochlorperazine (COMPAZINE) 10 MG tablet Take 1 tablet (10 mg total) by mouth every 6 (six) hours as needed (Nausea or vomiting). 01/01/16 03/03/16  [name], MD    Family History Family History  Problem Relation Age of Onset  . Heart failure Mother   . Hypertension Mother   . Cancer Neg Hx   . Rheumatologic disease Neg Hx     Social History Social History   Tobacco Use  . Smoking status: Never Smoker  . Smokeless tobacco: Never Used  Substance Use Topics  . Alcohol use: No  . Drug use: No     Allergies   Patient has no known allergies.   Review of Systems Review of Systems  Constitutional: Negative for chills and fever.  Eyes: Negative for visual disturbance.  Respiratory: Negative for cough and shortness of breath.   Cardiovascular: Negative for chest pain.  Gastrointestinal: Negative for abdominal pain, constipation,  diarrhea, nausea and vomiting.  Genitourinary: Negative for dysuria, flank pain and frequency.  Musculoskeletal: Negative for back pain and myalgias.  Skin: Negative for rash and wound.  Neurological: Positive for tremors, weakness and numbness. Negative for dizziness, syncope, speech difficulty and headaches.  Psychiatric/Behavioral: The patient is nervous/anxious.   All other systems reviewed and are negative.    Physical Exam Updated Vital Signs BP (!) 147/74   Pulse (!) 124   Temp 98.5 F (36.9 C) (Oral)   Resp 14   Ht 5\' 9"  (1.753 m)   Wt 126.1 kg   LMP 01/21/2016 Comment: pt signed preg test waiver 04/16/16   SpO2 97%   BMI 41.05 kg/m   Physical Exam Vitals signs and nursing note reviewed.  Constitutional:      Appearance: Normal appearance. She is well-developed.  HENT:     Head: Normocephalic and atraumatic.     Comments: Mild decrease sensation to light touch in the right upper and lower face.  Otherwise cranial nerves II through XII intact.    Mouth/Throat:     Mouth: Mucous membranes are moist.  Eyes:     Extraocular Movements: Extraocular movements intact.     Pupils: Pupils are equal, round, and reactive to light.  Neck:     Musculoskeletal: Normal range of motion and neck supple.  Cardiovascular:     Rate and Rhythm: Regular rhythm. Tachycardia present.     Heart sounds: No murmur. No friction rub. No gallop.   Pulmonary:     Effort: Pulmonary effort is normal. No respiratory distress.     Breath sounds: Normal breath sounds. No stridor. No wheezing, rhonchi or rales.  Abdominal:     General: Bowel sounds are normal.     Palpations: Abdomen is soft.     Tenderness: There is no abdominal tenderness. There is no guarding or rebound.  Musculoskeletal: Normal range of motion.        General: No tenderness.  Skin:    General: Skin is warm and dry.     Capillary Refill: Capillary refill takes less than 2 seconds.     Findings: No erythema or rash.    Neurological:     Mental Status: She is alert and oriented to person, place, and time.     Comments: Decrease sensation to light touch  right upper and right lower extremity.  4/5 motor right grip strength and right lower extremity.  5/5 motor left grip strength.  4/5 motor left lower extremity.  Finger-nose testing intact.  Psychiatric:        Behavior: Behavior normal.      ED Treatments / Results  Labs (all labs ordered are listed, but only abnormal results are displayed) Labs Reviewed  CBC - Abnormal; Notable for the following components:      Result Value   RBC 3.14 (*)    Hemoglobin 9.1 (*)    HCT 29.7 (*)    All other components within normal limits  DIFFERENTIAL - Abnormal; Notable for the following components:   Lymphs Abs 0.6 (*)    All other components within normal limits  CBG MONITORING, ED - Abnormal; Notable for the following components:   Glucose-Capillary 270 (*)    All other components within normal limits  ETHANOL  PROTIME-INR  APTT  COMPREHENSIVE METABOLIC PANEL  RAPID URINE DRUG SCREEN, HOSP PERFORMED  URINALYSIS, ROUTINE W REFLEX MICROSCOPIC  I-STAT TROPONIN, ED  I-STAT BETA HCG BLOOD, ED ([facility], [facility], [facility] ONLY)    EKG EKG Interpretation  Date/Time:  Friday April 28 2018 21:49:22 EST Ventricular Rate:  127 PR Interval:    QRS Duration: 74 QT Interval:  313 QTC Calculation: 455 R Axis:   12 Text Interpretation:  Sinus tachycardia Low voltage, precordial leads Borderline T wave abnormalities Confirmed by Julianne Rice [phone]) on 04/29/2018 12:06:53 AM   Radiology Ct Head Code Stroke Wo Contrast  Result Date: 04/28/2018 CLINICAL DATA:  Code stroke. Initial evaluation for acute right-sided numbness and weakness. EXAM: CT HEAD WITHOUT CONTRAST TECHNIQUE: Contiguous axial images were obtained from the base of the skull through the vertex without intravenous contrast. COMPARISON:  Prior MRI from 11/19/2016 FINDINGS: Brain: Cerebral volume within normal  limits for patient age. No evidence for acute intracranial hemorrhage. No findings to suggest acute large vessel territory infarct. No mass lesion, midline shift, or mass effect. Ventricles are normal in size without evidence for hydrocephalus. No extra-axial fluid collection identified. Vascular: No hyperdense vessel identified. Skull: Scalp soft tissues demonstrate no acute abnormality. Calvarium intact. Sinuses/Orbits: Globes and orbital soft tissues within normal limits. Visualized paranasal sinuses are clear. No mastoid effusion. ASPECTS [facility] Stroke Program Early CT Score) - Ganglionic level infarction (caudate, lentiform nuclei, internal capsule, insula, M1-M3 cortex): 7 - Supraganglionic infarction (M4-M6 cortex): 3 Total score (0-10 with 10 being normal): 10 IMPRESSION: 1. Negative head CT.  No acute intracranial abnormality identified 2. ASPECTS is 10. Critical Value/emergent results were called by telephone at the time of interpretation on 04/28/2018 at 10:38 pm to Dr. Julianne Rice , who verbally acknowledged these results. Electronically Signed   By: Jeannine Boga M.D.   On: 04/28/2018 22:40    Procedures Procedures (including critical care time)  Medications Ordered in ED Medications  sodium chloride flush (NS) 0.9 % injection 10-40 mL (has no administration in time range)     Initial Impression / Assessment and Plan / ED Course  I have reviewed the triage vital signs and the nursing notes.  Pertinent labs & imaging results that were available during my care of the patient were reviewed by me and considered in my medical decision making (see chart for details).     Patient is neurologic exam is not convincing for stroke verbal appears to have some deficit is present.  Code stroke was called. Evaluated by neurology.  Not feel  that patient is a TPA candidate.  CT head without acute findings.  Will admit for stroke work-up.  Discussed with hospitalist who will see patient  emergency department. Final Clinical Impressions(s) / ED Diagnoses   Final diagnoses:  Right sided numbness    ED Discharge Orders    None       Julianne Rice, MD 04/29/18 0007

## 2018-04-28 NOTE — ED Triage Notes (Addendum)
Pt reports that she started feeling numb and weak on right side around 7pm. Grip weaker in right hand then left and arm trembling when lifting. No loss of sensation anywhere. Lower extremities equal in strength. Pt was lifting both arms equally while talking. No facial droop noted in triage. Pt also state sthat she has pain on ride side of neck back and head

## 2018-04-28 NOTE — ED Notes (Signed)
Tele Neuro Eval completed at this time.

## 2018-04-28 NOTE — Consult Note (Signed)
[facility] TeleNeurology Consult Services   Date of Service:   04/28/2018 22:52:08  Impression:     .  RO Acute Ischemic Stroke     .  Small Vessel Infarct     .  Left Hemispheric Infarct  Comments: Patient with right-sided sensory loss and subjective weakness. Rule out lacunar infarct of the left hemispheric deep brain structures. Consider lunc CA mets, as well.  Metrics: Last Known Well: 04/28/2018 19:00:00 [facility] Notification Time: 04/28/2018 22:52:08 Arrival Time: 04/28/2018 21:39:00 Stamp Time: 04/28/2018 22:52:08 Time First Login Attempt: 04/28/2018 22:55:00 Video Start Time: 04/28/2018 22:55:00  Symptoms: right-sided numbness NIHSS Start Assessment Time: 04/28/2018 22:57:00 Patient is not a candidate for tPA. Patient was not deemed candidate for tPA thrombolytics because of deficits are mild and nondisabling. Video End Time: 04/28/2018 23:04:00  CT head showed no acute hemorrhage or acute core infarct.  Presentation is not suggestive of Large Vessel Occlusive disease.  Advanced imaging was not obtained as the presentation was not suggestive of Large Vessel Occlusive Disease.   ED Physician notified of diagnostic impression and management plan on 04/28/2018 23:05:00  Our recommendations are outlined below.  Recommendations:     .  Activate Stroke Protocol Admission/Order Set     .  Stroke/Telemetry Floor     .  Neuro Checks     .  Bedside Swallow Eval     .  DVT Prophylaxis     .  IV Fluids, Normal Saline     .  Head of Bed Below 30 Degrees     .  Euglycemia and Avoid Hyperthermia (PRN Acetaminophen)     .  Antiplatelet Therapy Recommended     .  MRI brain wow  Routine Consultation with [facility] for Follow up Care  Sign Out:     .  Discussed with Emergency Department Provider    ------------------------------------------------------------------------------  History of Present Illness: Patient is a 53 year old  Female.  Patient was brought by private transportation with symptoms of right-sided numbness  Pt with a history of HTN, DM, metastatic lung CA (last surveillance MRI showed no brain mets). Last normal at 1900 prior to a nap. Had a HA at that time. She woke at 2030 with right-sided numbness. No vision loss, speech/language changes, vertigo, CP. Endorses weakness on right, but may be just the numb feeling on the right - was able to walk in the ED without difficulty.  CT head showed no acute hemorrhage or acute core infarct.    Examination: 1A: Level of Consciousness - Alert; keenly responsive + 0 1B: Ask Month and Age - Both Questions Right + 0 1C: Blink Eyes & Squeeze Hands - Performs Both Tasks + 0 2: Test Horizontal Extraocular Movements - Normal + 0 3: Test Visual Fields - No Visual Loss + 0 4: Test Facial Palsy (Use Grimace if Obtunded) - Normal symmetry + 0 5A: Test Left Arm Motor Drift - No Drift for 10 Seconds + 0 5B: Test Right Arm Motor Drift - No Drift for 10 Seconds + 0 6A: Test Left Leg Motor Drift - No Drift for 5 Seconds + 0 6B: Test Right Leg Motor Drift - No Drift for 5 Seconds + 0 7: Test Limb Ataxia (FNF/Heel-Shin) - No Ataxia + 0 8: Test Sensation - Mild-Moderate Loss: Less Sharp/More Dull + 1 9: Test Language/Aphasia - Normal; No aphasia + 0 10: Test Dysarthria - Normal + 0 11: Test Extinction/Inattention - No abnormality + 0  NIHSS Score: 1  Patient was informed the Neurology Consult would happen via TeleHealth consult by way of interactive audio and video telecommunications and consented to receiving care in this manner.  Due to the immediate potential for life-threatening deterioration due to underlying acute neurologic illness, I spent 35 minutes providing critical care. This time includes time for face to face visit via telemedicine, review of medical records, imaging studies and discussion of findings with providers, the patient and/or family.   Dr Arne Cleveland   [facility] [phone]  Case [id number]

## 2018-04-28 NOTE — ED Notes (Signed)
Pt passed [place].

## 2018-04-28 NOTE — ED Notes (Addendum)
Code Stroke called @22 :26pm per RN,Terri,CN.

## 2018-04-29 ENCOUNTER — Observation Stay (HOSPITAL_COMMUNITY): Payer: Medicaid Other

## 2018-04-29 ENCOUNTER — Observation Stay (HOSPITAL_BASED_OUTPATIENT_CLINIC_OR_DEPARTMENT_OTHER): Payer: Medicaid Other

## 2018-04-29 DIAGNOSIS — R29898 Other symptoms and signs involving the musculoskeletal system: Secondary | ICD-10-CM

## 2018-04-29 DIAGNOSIS — R200 Anesthesia of skin: Secondary | ICD-10-CM

## 2018-04-29 DIAGNOSIS — I110 Hypertensive heart disease with heart failure: Secondary | ICD-10-CM | POA: Diagnosis not present

## 2018-04-29 DIAGNOSIS — I639 Cerebral infarction, unspecified: Secondary | ICD-10-CM | POA: Diagnosis not present

## 2018-04-29 DIAGNOSIS — E785 Hyperlipidemia, unspecified: Secondary | ICD-10-CM

## 2018-04-29 DIAGNOSIS — E039 Hypothyroidism, unspecified: Secondary | ICD-10-CM | POA: Diagnosis not present

## 2018-04-29 DIAGNOSIS — C3492 Malignant neoplasm of unspecified part of left bronchus or lung: Secondary | ICD-10-CM | POA: Diagnosis not present

## 2018-04-29 DIAGNOSIS — I5032 Chronic diastolic (congestive) heart failure: Secondary | ICD-10-CM | POA: Diagnosis not present

## 2018-04-29 DIAGNOSIS — E119 Type 2 diabetes mellitus without complications: Secondary | ICD-10-CM | POA: Diagnosis not present

## 2018-04-29 DIAGNOSIS — C773 Secondary and unspecified malignant neoplasm of axilla and upper limb lymph nodes: Secondary | ICD-10-CM | POA: Diagnosis not present

## 2018-04-29 DIAGNOSIS — N390 Urinary tract infection, site not specified: Secondary | ICD-10-CM | POA: Diagnosis not present

## 2018-04-29 DIAGNOSIS — J45909 Unspecified asthma, uncomplicated: Secondary | ICD-10-CM | POA: Diagnosis present

## 2018-04-29 DIAGNOSIS — F329 Major depressive disorder, single episode, unspecified: Secondary | ICD-10-CM | POA: Diagnosis present

## 2018-04-29 DIAGNOSIS — F32A Depression, unspecified: Secondary | ICD-10-CM | POA: Diagnosis present

## 2018-04-29 DIAGNOSIS — E669 Obesity, unspecified: Secondary | ICD-10-CM | POA: Diagnosis not present

## 2018-04-29 DIAGNOSIS — M4802 Spinal stenosis, cervical region: Secondary | ICD-10-CM | POA: Diagnosis not present

## 2018-04-29 DIAGNOSIS — Z794 Long term (current) use of insulin: Secondary | ICD-10-CM | POA: Diagnosis not present

## 2018-04-29 DIAGNOSIS — Z79899 Other long term (current) drug therapy: Secondary | ICD-10-CM | POA: Diagnosis not present

## 2018-04-29 LAB — COMPREHENSIVE METABOLIC PANEL
ALT: 11 U/L (ref 0–44)
AST: 15 U/L (ref 15–41)
Albumin: 3 g/dL — ABNORMAL LOW (ref 3.5–5.0)
Alkaline Phosphatase: 77 U/L (ref 38–126)
Anion gap: 6 (ref 5–15)
BUN: 11 mg/dL (ref 6–20)
CO2: 27 mmol/L (ref 22–32)
Calcium: 8.2 mg/dL — ABNORMAL LOW (ref 8.9–10.3)
Chloride: 105 mmol/L (ref 98–111)
Creatinine, Ser: 0.62 mg/dL (ref 0.44–1.00)
GFR calc Af Amer: >60 mL/min (ref >60)
GFR calc non Af Amer: >60 mL/min (ref >60)
Glucose, Bld: 298 mg/dL — ABNORMAL HIGH (ref 70–99)
Potassium: 3.4 mmol/L — ABNORMAL LOW (ref 3.5–5.1)
Sodium: 138 mmol/L (ref 135–145)
Total Bilirubin: <0.1 mg/dL — ABNORMAL LOW (ref 0.3–1.2)
Total Protein: 7 g/dL (ref 6.5–8.1)

## 2018-04-29 LAB — URINALYSIS, ROUTINE W REFLEX MICROSCOPIC
Bilirubin Urine: NEGATIVE
Glucose, UA: 50 mg/dL — AB
Ketones, ur: NEGATIVE mg/dL
Nitrite: POSITIVE — AB
Protein, ur: NEGATIVE mg/dL
SPECIFIC GRAVITY, URINE: 1.025 (ref 1.005–1.030)
WBC, UA: >50 WBC/hpf — ABNORMAL HIGH (ref 0–5)
pH: 5 (ref 5.0–8.0)

## 2018-04-29 LAB — ETHANOL

## 2018-04-29 LAB — RAPID URINE DRUG SCREEN, HOSP PERFORMED
Amphetamines: NOT DETECTED
Barbiturates: NOT DETECTED
Benzodiazepines: NOT DETECTED
Cocaine: NOT DETECTED
Opiates: NOT DETECTED
Tetrahydrocannabinol: NOT DETECTED

## 2018-04-29 LAB — GLUCOSE, CAPILLARY
GLUCOSE-CAPILLARY: 163 mg/dL — AB (ref 70–99)
Glucose-Capillary: 246 mg/dL — ABNORMAL HIGH (ref 70–99)
Glucose-Capillary: 345 mg/dL — ABNORMAL HIGH (ref 70–99)
Glucose-Capillary: 274 mg/dL — ABNORMAL HIGH (ref 70–99)
Glucose-Capillary: 213 mg/dL — ABNORMAL HIGH (ref 70–99)

## 2018-04-29 MED ORDER — ONDANSETRON HCL 4 MG/2ML IJ SOLN: 4.0000 mg | Freq: Three times a day (TID) | INTRAMUSCULAR | Status: DC | PRN

## 2018-04-29 MED ORDER — ENOXAPARIN SODIUM 40 MG/0.4ML ~~LOC~~ SOLN
40.0000 mg | SUBCUTANEOUS | Status: DC
  Administered 2018-04-29 – 2018-04-30 (×2): 40 mg via SUBCUTANEOUS
  Filled 2018-04-29 (×2): qty 0.4

## 2018-04-29 MED ORDER — METOPROLOL SUCCINATE ER 25 MG PO TB24
50.0000 mg | ORAL_TABLET | Freq: Every day | ORAL | Status: DC
  Administered 2018-04-29 – 2018-04-30 (×2): 50 mg via ORAL
  Filled 2018-04-29 (×2): qty 2

## 2018-04-29 MED ORDER — SENNOSIDES-DOCUSATE SODIUM 8.6-50 MG PO TABS: 2.0000 | ORAL_TABLET | Freq: Two times a day (BID) | ORAL | Status: DC | PRN

## 2018-04-29 MED ORDER — GADOBUTROL 1 MMOL/ML IV SOLN
10.0000 mL | Freq: Once | INTRAVENOUS | Status: AC | PRN
  Administered 2018-04-29: 10 mL via INTRAVENOUS

## 2018-04-29 MED ORDER — PREGABALIN 50 MG PO CAPS
50.0000 mg | ORAL_CAPSULE | Freq: Three times a day (TID) | ORAL | Status: DC
  Administered 2018-04-29 – 2018-04-30 (×4): 50 mg via ORAL
  Filled 2018-04-29 (×4): qty 1

## 2018-04-29 MED ORDER — ATORVASTATIN CALCIUM 40 MG PO TABS
40.0000 mg | ORAL_TABLET | Freq: Every day | ORAL | Status: DC
  Administered 2018-04-29: 40 mg via ORAL
  Filled 2018-04-29: qty 1

## 2018-04-29 MED ORDER — NITROFURANTOIN MONOHYD MACRO 100 MG PO CAPS
100.0000 mg | ORAL_CAPSULE | Freq: Two times a day (BID) | ORAL | Status: DC
  Administered 2018-04-29 – 2018-04-30 (×3): 100 mg via ORAL
  Filled 2018-04-29 (×3): qty 1

## 2018-04-29 MED ORDER — HYDROXYZINE HCL 10 MG PO TABS
10.0000 mg | ORAL_TABLET | Freq: Three times a day (TID) | ORAL | Status: DC | PRN
  Filled 2018-04-29: qty 1

## 2018-04-29 MED ORDER — METOPROLOL SUCCINATE ER 25 MG PO TB24
25.0000 mg | ORAL_TABLET | Freq: Every day | ORAL | Status: DC
  Administered 2018-04-29: 25 mg via ORAL
  Filled 2018-04-29: qty 1

## 2018-04-29 MED ORDER — INSULIN ASPART 100 UNIT/ML ~~LOC~~ SOLN
0.0000 [IU] | Freq: Three times a day (TID) | SUBCUTANEOUS | Status: DC
  Administered 2018-04-29: 3 [IU] via SUBCUTANEOUS
  Administered 2018-04-29: 5 [IU] via SUBCUTANEOUS
  Administered 2018-04-29: 9 [IU] via SUBCUTANEOUS

## 2018-04-29 MED ORDER — ACETAMINOPHEN 325 MG PO TABS: 650.0000 mg | ORAL_TABLET | ORAL | Status: DC | PRN

## 2018-04-29 MED ORDER — INSULIN GLARGINE 100 UNIT/ML ~~LOC~~ SOLN
70.0000 [IU] | Freq: Every day | SUBCUTANEOUS | Status: DC
  Administered 2018-04-29 – 2018-04-30 (×2): 70 [IU] via SUBCUTANEOUS
  Filled 2018-04-29 (×2): qty 0.7

## 2018-04-29 MED ORDER — OXYCODONE-ACETAMINOPHEN 5-325 MG PO TABS: 1.0000 | ORAL_TABLET | Freq: Four times a day (QID) | ORAL | Status: DC | PRN

## 2018-04-29 MED ORDER — ACETAMINOPHEN 160 MG/5ML PO SOLN: 650.0000 mg | ORAL | Status: DC | PRN

## 2018-04-29 MED ORDER — STROKE: EARLY STAGES OF RECOVERY BOOK
Freq: Once | Status: AC
  Administered 2018-04-29: 06:00:00
  Filled 2018-04-29: qty 1

## 2018-04-29 MED ORDER — ASPIRIN 300 MG RE SUPP: 300.0000 mg | Freq: Every day | RECTAL | Status: DC

## 2018-04-29 MED ORDER — LEVOTHYROXINE SODIUM 150 MCG PO TABS
150.0000 ug | ORAL_TABLET | Freq: Every day | ORAL | Status: DC
  Administered 2018-04-29 – 2018-04-30 (×2): 150 ug via ORAL
  Filled 2018-04-29 (×2): qty 2
  Filled 2018-04-29 (×2): qty 1

## 2018-04-29 MED ORDER — DULOXETINE HCL 30 MG PO CPEP
60.0000 mg | ORAL_CAPSULE | Freq: Every day | ORAL | Status: DC
  Administered 2018-04-29 – 2018-04-30 (×2): 60 mg via ORAL
  Filled 2018-04-29 (×2): qty 2

## 2018-04-29 MED ORDER — ACETAMINOPHEN 650 MG RE SUPP: 650.0000 mg | RECTAL | Status: DC | PRN

## 2018-04-29 MED ORDER — HYDRALAZINE HCL 20 MG/ML IJ SOLN: 5.0000 mg | INTRAMUSCULAR | Status: DC | PRN

## 2018-04-29 MED ORDER — POTASSIUM CHLORIDE 20 MEQ/15ML (10%) PO SOLN
20.0000 meq | Freq: Once | ORAL | Status: AC
  Administered 2018-04-29: 20 meq via ORAL
  Filled 2018-04-29: qty 15

## 2018-04-29 MED ORDER — ALBUTEROL SULFATE (2.5 MG/3ML) 0.083% IN NEBU: 3.0000 mL | INHALATION_SOLUTION | Freq: Three times a day (TID) | RESPIRATORY_TRACT | Status: DC | PRN

## 2018-04-29 MED ORDER — VITAMINS A & D EX OINT
1.0000 "application " | TOPICAL_OINTMENT | Freq: Two times a day (BID) | CUTANEOUS | Status: DC
  Administered 2018-04-29 – 2018-04-30 (×3): 1 via TOPICAL
  Filled 2018-04-29: qty 56.7

## 2018-04-29 MED ORDER — ASPIRIN 325 MG PO TABS
325.0000 mg | ORAL_TABLET | Freq: Every day | ORAL | Status: DC
  Administered 2018-04-29 – 2018-04-30 (×2): 325 mg via ORAL
  Filled 2018-04-29 (×2): qty 1

## 2018-04-29 NOTE — Progress Notes (Addendum)
PROGRESS NOTE  Hawley Pavia Gauer TIW:[id number] DOB: 1965-04-24 DOA: 04/28/2018 PCP: [name], FNP  HPI/Recap of past 24 hours: Ms. Neesha Langton. 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  Tele-neurology was obtained they advised admitting and having a full stroke work-up stroke work-up is in progress her MRI/MRA are negative CT scan was negative 2D echo still pending  Subjective: Patient seen and examined at bedside still complaining of numbness on her right arm.  Assessment/Plan: Principal Problem:   Left arm weakness and numbness Active Problems:   Diabetes mellitus without complication (HCC)   Adenocarcinoma of left lung, stage 3 (HCC)   Hypothyroidism   Essential hypertension   HLD (hyperlipidemia)   Asthma   Depression   Chronic diastolic CHF (congestive heart failure) (HCC)   Right arm weakness  #1 right arm weakness continue stroke work-up but tele-neurology recommendation.  MRI and MRA are negative .   2D echo is still pending..  She had physical therapy evaluation.  And they are recommending home PT  2.  Urinary tract infection.  Start oral nitrofurantoin  3.  Hypertension, improved it was initially uncontrolled but with IV hydralazine is improved continue metoprolol  4.  Metastatic adenocarcinoma of the left lung stage III patient will continue follow-up with oncology Dr. Irene Limbo  5.  Chronic diastolic congestive heart failure, this seems to be well compensated her last ejection fraction was 55-60% on a 2D echo that was on July 28, 2017  6.  Diabetes mellitus without complications, uncontrolled, her sugar blood sugar level is in the 300s, last A1c was 8.9 patient will continue current medication.  She is  currently on sliding scale as well  7.  Tachycardia with mild exertion.  Patient's heart rate was noted to be in the 130s during physical therapy evaluation continue metoprolol  Code Status: Full  Severity of Illness: The appropriate patient status for this patient is OBSERVATION. Observation status is judged to be reasonable and necessary in order to provide the required intensity of service to ensure the patient's safety. The patient's presenting symptoms, physical exam findings, and initial radiographic and laboratory data in the context of their medical condition is felt to place them at decreased risk for further clinical deterioration. Furthermore, it is anticipated that the patient will be medically stable for discharge from the hospital within 2 midnights of admission. The following factors support the patient status of observation.   " The patient's presenting symptoms include right numbness and weakness.  Hyperglycemia " The physical exam findings include weakness of the right upper extremity. " The initial radiographic and laboratory data are negative.     Family Communication: None at bedside  Disposition Plan: Home when stable   Consultants:  Tele-neurology  Procedures:  MRI/MRA  Antimicrobials:  None  DVT prophylaxis: Lovenox      Objective: Vitals:   04/29/18 0104 04/29/18 0219 04/29/18 0613 04/29/18 0750  BP: (!) 153/88 137/84 126/88 134/85  Pulse: (!) 118 (!) 123 (!) 122 (!) 118  Resp:    16  Temp:   98.5 F (36.9 C) 98.6 F (37 C)  TempSrc:   Oral Oral  SpO2: 97% 98% 95% 92%  Weight:      Height:       No intake or output data  in the 24 hours ending 04/29/18 0903 Filed Weights   04/28/18 2233  Weight: 126.1 kg   Body mass index is 41.05 kg/m.  Exam:  . General: 53 y.o. year-old female well developed well nourished in no acute distress.  Alert and oriented x3. . Cardiovascular: Regular rate and rhythm with no rubs or gallops.  No  thyromegaly or JVD noted.   [name] Respiratory: Clear to auscultation with no wheezes or rales. Good inspiratory effort. . Abdomen: Soft nontender nondistended with normal bowel sounds x4 quadrants. . Musculoskeletal: No lower extremity edema. 2/4 pulses in all 4 extremities. . Skin: No ulcerative lesions noted or rashes, . Psychiatry: Mood is appropriate for condition and setting . Neurological: Decreased strength in the right hand as well as tremor coarse tremor was noted    Data Reviewed: CBC: Recent Labs  Lab 04/28/18 2237  WBC 5.9  NEUTROABS 4.6  HGB 9.1*  HCT 29.7*  MCV 94.6  PLT 109   Basic Metabolic Panel: Recent Labs  Lab 04/28/18 2237  NA 138  K 3.4*  CL 105  CO2 27  GLUCOSE 298*  BUN 11  CREATININE 0.62  CALCIUM 8.2*   GFR: Estimated Creatinine Clearance: 119.8 mL/min (by C-G formula based on SCr of 0.62 mg/dL). Liver Function Tests: Recent Labs  Lab 04/28/18 2237  AST 15  ALT 11  ALKPHOS 77  BILITOT <0.1*  PROT 7.0  ALBUMIN 3.0*   No results for input(s): LIPASE, AMYLASE in the last 168 hours. No results for input(s): AMMONIA in the last 168 hours. Coagulation Profile: Recent Labs  Lab 04/28/18 2237  INR 1.04   Cardiac Enzymes: No results for input(s): CKTOTAL, CKMB, CKMBINDEX, TROPONINI in the last 168 hours. BNP (last 3 results) No results for input(s): PROBNP in the last 8760 hours. HbA1C: No results for input(s): HGBA1C in the last 72 hours. CBG: Recent Labs  Lab 04/28/18 2252 04/29/18 0243 04/29/18 0611  GLUCAP 270* 246* 345*   Lipid Profile: No results for input(s): CHOL, HDL, LDLCALC, TRIG, CHOLHDL, LDLDIRECT in the last 72 hours. Thyroid Function Tests: No results for input(s): TSH, T4TOTAL, FREET4, T3FREE, THYROIDAB in the last 72 hours. Anemia Panel: No results for input(s): VITAMINB12, FOLATE, FERRITIN, TIBC, IRON, RETICCTPCT in the last 72 hours. Urine analysis:    Component Value Date/Time   COLORURINE YELLOW  04/29/2018 0419   APPEARANCEUR HAZY (A) 04/29/2018 0419   LABSPEC 1.025 04/29/2018 0419   PHURINE 5.0 04/29/2018 0419   GLUCOSEU 50 (A) 04/29/2018 0419   HGBUR MODERATE (A) 04/29/2018 0419   BILIRUBINUR NEGATIVE 04/29/2018 0419   KETONESUR NEGATIVE 04/29/2018 0419   PROTEINUR NEGATIVE 04/29/2018 0419   UROBILINOGEN 0.2 02/01/2013 2022   NITRITE POSITIVE (A) 04/29/2018 0419   LEUKOCYTESUR MODERATE (A) 04/29/2018 0419   Sepsis Labs: @LABRCNTIP (procalcitonin:4,lacticidven:4)  )No results found for this or any previous visit (from the past 240 hour(s)).    Studies: Mr Jeri Cos NA Contrast  Result Date: 04/29/2018 CLINICAL DATA:  Left arm weakness, right facial numbness. Metastatic lung cancer. EXAM: MRI HEAD WITHOUT AND WITH CONTRAST MRA HEAD WITHOUT CONTRAST TECHNIQUE: Multiplanar, multiecho pulse sequences of the brain and surrounding structures were obtained without and with intravenous contrast. Angiographic images of the head were obtained using MRA technique without contrast. CONTRAST:  10 mL Gadavist COMPARISON:  None. FINDINGS: MRI HEAD FINDINGS BRAIN: There is no acute infarct, acute hemorrhage or mass effect. The midline structures are normal. There are no old infarcts. The white matter signal is  normal for the patient's age. The CSF spaces are normal for age, with no hydrocephalus. Susceptibility-sensitive sequences show no chronic microhemorrhage or superficial siderosis. SKULL AND UPPER CERVICAL SPINE: The visualized skull base, calvarium, upper cervical spine and extracranial soft tissues are normal. SINUSES/ORBITS: No fluid levels or advanced mucosal thickening. No mastoid or middle ear effusion. The orbits are normal. MRA HEAD FINDINGS POSTERIOR CIRCULATION: --Basilar artery: Normal. --Posterior cerebral arteries: Normal.  Bilateral fetal origins. --Superior cerebellar arteries: Normal. --Inferior cerebellar arteries: Normal posterior inferior cerebellar arteries. Anterior inferior  cerebellar arteries are not clearly visible, but this is not uncommon. ANTERIOR CIRCULATION: --Intracranial internal carotid arteries: Normal. --Anterior cerebral arteries: Normal. Both A1 segments are present. Patent anterior communicating artery. --Middle cerebral arteries: Normal. --Posterior communicating arteries: Present bilaterally. IMPRESSION: 1. Normal MRI of the brain. 2. Normal intracranial MRA. Electronically Signed   By: Ulyses Jarred M.D.   On: 04/29/2018 06:03   Mr Cervical Spine W Wo Contrast  Result Date: 04/29/2018 CLINICAL DATA:  Left arm weakness and numbness. Metastatic lung cancer. EXAM: MRI CERVICAL SPINE WITHOUT AND WITH CONTRAST TECHNIQUE: Multiplanar and multiecho pulse sequences of the cervical spine, to include the craniocervical junction and cervicothoracic junction, were obtained without and with intravenous contrast. CONTRAST:  10 mL Gadavist COMPARISON:  None. FINDINGS: Alignment: Normal. Vertebrae: No focal marrow lesion. No compression fracture or evidence of discitis osteomyelitis. Cord: Normal caliber and signal. Posterior Fossa, vertebral arteries, paraspinal tissues: Visualized posterior fossa is normal. Vertebral artery flow voids are preserved. No prevertebral effusion. Disc levels: Sagittal imaging includes the atlantoaxial joint to the level of the T3-4 disc space, with axial imaging of the disc spaces from C2-3 to C7-T1. Atlantoaxial articulation is normal. C2-3: Normal. C3-4: Normal. C4-5: Normal. C5-6: Small central disc protrusion with mild spinal canal stenosis. No neural foraminal stenosis. C6-7: Left subarticular disc protrusion causes mild spinal canal stenosis and moderate left neural foraminal stenosis. Right neural foramen is patent. C7-T1: Normal. Sagittal imaging of the T1-T4 levels is normal. IMPRESSION: 1. No cervical metastatic disease. 2. 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 3. Unchanged moderate left neural foraminal stenosis at C6-7.  Electronically Signed   By: Ulyses Jarred M.D.   On: 04/29/2018 06:09   Mr Jodene Nam Head Wo Contrast  Result Date: 04/29/2018 CLINICAL DATA:  Left arm weakness, right facial numbness. Metastatic lung cancer. EXAM: MRI HEAD WITHOUT AND WITH CONTRAST MRA HEAD WITHOUT CONTRAST TECHNIQUE: Multiplanar, multiecho pulse sequences of the brain and surrounding structures were obtained without and with intravenous contrast. Angiographic images of the head were obtained using MRA technique without contrast. CONTRAST:  10 mL Gadavist COMPARISON:  None. FINDINGS: MRI HEAD FINDINGS BRAIN: There is no acute infarct, acute hemorrhage or mass effect. The midline structures are normal. There are no old infarcts. The white matter signal is normal for the patient's age. The CSF spaces are normal for age, with no hydrocephalus. Susceptibility-sensitive sequences show no chronic microhemorrhage or superficial siderosis. SKULL AND UPPER CERVICAL SPINE: The visualized skull base, calvarium, upper cervical spine and extracranial soft tissues are normal. SINUSES/ORBITS: No fluid levels or advanced mucosal thickening. No mastoid or middle ear effusion. The orbits are normal. MRA HEAD FINDINGS POSTERIOR CIRCULATION: --Basilar artery: Normal. --Posterior cerebral arteries: Normal.  Bilateral fetal origins. --Superior cerebellar arteries: Normal. --Inferior cerebellar arteries: Normal posterior inferior cerebellar arteries. Anterior inferior cerebellar arteries are not clearly visible, but this is not uncommon. ANTERIOR CIRCULATION: --Intracranial internal carotid arteries: Normal. --Anterior cerebral arteries: Normal. Both  A1 segments are present. Patent anterior communicating artery. --Middle cerebral arteries: Normal. --Posterior communicating arteries: Present bilaterally. IMPRESSION: 1. Normal MRI of the brain. 2. Normal intracranial MRA. Electronically Signed   By: Ulyses Jarred M.D.   On: 04/29/2018 06:03   Ct Head Code Stroke Wo  Contrast  Result Date: 04/28/2018 CLINICAL DATA:  Code stroke. Initial evaluation for acute right-sided numbness and weakness. EXAM: CT HEAD WITHOUT CONTRAST TECHNIQUE: Contiguous axial images were obtained from the base of the skull through the vertex without intravenous contrast. COMPARISON:  Prior MRI from 11/19/2016 FINDINGS: Brain: Cerebral volume within normal limits for patient age. No evidence for acute intracranial hemorrhage. No findings to suggest acute large vessel territory infarct. No mass lesion, midline shift, or mass effect. Ventricles are normal in size without evidence for hydrocephalus. No extra-axial fluid collection identified. Vascular: No hyperdense vessel identified. Skull: Scalp soft tissues demonstrate no acute abnormality. Calvarium intact. Sinuses/Orbits: Globes and orbital soft tissues within normal limits. Visualized paranasal sinuses are clear. No mastoid effusion. ASPECTS [facility] Stroke Program Early CT Score) - Ganglionic level infarction (caudate, lentiform nuclei, internal capsule, insula, M1-M3 cortex): 7 - Supraganglionic infarction (M4-M6 cortex): 3 Total score (0-10 with 10 being normal): 10 IMPRESSION: 1. Negative head CT.  No acute intracranial abnormality identified 2. ASPECTS is 10. Critical Value/emergent results were called by telephone at the time of interpretation on 04/28/2018 at 10:38 pm to Dr. Julianne Rice , who verbally acknowledged these results. Electronically Signed   By: Jeannine Boga M.D.   On: 04/28/2018 22:40    Scheduled Meds: . aspirin  300 mg Rectal Daily   Or  . aspirin  325 mg Oral Daily  . atorvastatin  40 mg Oral q1800  . DULoxetine  60 mg Oral Daily  . enoxaparin (LOVENOX) injection  40 mg Subcutaneous Q24H  . insulin aspart  0-9 Units Subcutaneous TID WC  . insulin glargine  70 Units Subcutaneous QAC breakfast  . levothyroxine  150 mcg Oral Q0600  . metoprolol succinate  25 mg Oral QHS  . metoprolol succinate  50 mg Oral  Daily  . pregabalin  50 mg Oral TID  . vitamin A & D  1 application Topical BID    Continuous Infusions:   LOS: 0 days     Cristal Deer, MD [facility]  To reach me or the doctor on call, go to: [email or web address] Password [facility]  04/29/2018, 9:03 AM

## 2018-04-29 NOTE — Evaluation (Signed)
Physical Therapy Evaluation Patient Details Name: Marilyn [name] MRN: [id number] DOB: 12-25-1965 Today's Date: 04/29/2018   History of Present Illness  Marilyn [name] is a 53 y.o. 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  CT and MRI negative.  Workup continued.     Clinical Impression  Pt presented sitting EOB upon arrival, awake and willing to participate in therapy. Prior to admission, pt reported that she was independent with all functional mobility and ADLs. She does report fatigue and SOB at baseline with short distance ambulation. Pt currently very limited secondary to fatigue and increased HR (beginning in sitting = 112bpm, standing = 134bpm). RN notified and aware. Pt would continue to benefit from skilled physical therapy services at this time while admitted and after d/c to address the below listed limitations in order to improve overall safety and independence with functional mobility.     Follow Up Recommendations Home health PT    Equipment Recommendations  None recommended by PT    Recommendations for Other Services       Precautions / Restrictions Precautions Precautions: Other (comment) Precaution Comments: watch HR  Restrictions Weight Bearing Restrictions: No      Mobility  Bed Mobility Overal bed mobility: Needs Assistance Bed Mobility: Supine to Sit     Supine to sit: Min assist;HOB elevated     General bed mobility comments: pt sitting EOB upon arrival  Transfers Overall transfer level: Needs assistance Equipment used: None Transfers: Sit to/from Stand Sit to Stand: Min guard         General transfer comment: min guard for safety  Ambulation/Gait             General Gait Details: deferred secondary to increasing HR  (112bpm in sitting, up to 134bpm in standing)  Stairs            Wheelchair Mobility    Modified Rankin (Stroke Patients Only)       Balance Overall balance assessment: Needs assistance Sitting-balance support: No upper extremity supported;Feet supported Sitting balance-Leahy Scale: Good     Standing balance support: No upper extremity supported;During functional activity Standing balance-Leahy Scale: Fair Standing balance comment: preference to UE support                             Pertinent Vitals/Pain Pain Assessment: No/denies pain    Home Living Family/patient expects to be discharged to:: Private residence Living Arrangements: Parent Available Help at Discharge: Family;Available PRN/intermittently Type of Home: House Home Access: Ramped entrance     Home Layout: One level Home Equipment: Bedside commode      Prior Function Level of Independence: Independent         Comments: independent ADLs, her mother assist with IADLs, pt completes med mgmt, - driving      Hand Dominance   Dominant Hand: Right    Extremity/Trunk Assessment   Upper Extremity Assessment Upper Extremity Assessment: Defer to OT evaluation;RUE deficits/detail RUE Deficits / Details: pt with intention tremors noted with purposeful movement and with resistance RUE Sensation: WNL RUE Coordination: decreased gross motor;decreased fine motor    Lower Extremity Assessment Lower Extremity Assessment: Generalized weakness       Communication   Communication: No difficulties  Cognition Arousal/Alertness: Awake/alert Behavior During Therapy:  WFL for tasks assessed/performed Overall Cognitive Status: Within Functional Limits for tasks assessed                                 General Comments: reports intermittent confusion and memory loss (as why she doesnt drive), WFL for tasks assessed      General Comments General comments (skin integrity, edema,  etc.): RA with oxygen saturation 98%, HR increased to 135 with ADL (toileting)     Exercises     Assessment/Plan    PT Assessment Patient needs continued PT services  PT Problem List Decreased strength;Decreased activity tolerance;Decreased balance;Decreased mobility;Cardiopulmonary status limiting activity       PT Treatment Interventions DME instruction;Gait training;Stair training;Functional mobility training;Therapeutic activities;Therapeutic exercise;Balance training;Neuromuscular re-education;Patient/family education    PT Goals (Current goals can be found in the Care Plan section)  Acute Rehab PT Goals Patient Stated Goal: none stated PT Goal Formulation: With patient Time For Goal Achievement: 05/13/18 Potential to Achieve Goals: Good    Frequency Min 3X/week   Barriers to discharge        Co-evaluation               AM-PAC PT "6 Clicks" Mobility  Outcome Measure Help needed turning from your back to your side while in a flat bed without using bedrails?: None Help needed moving from lying on your back to sitting on the side of a flat bed without using bedrails?: None Help needed moving to and from a bed to a chair (including a wheelchair)?: A Little Help needed standing up from a chair using your arms (e.g., wheelchair or bedside chair)?: None Help needed to walk in hospital room?: A Little Help needed climbing 3-5 steps with a railing? : A Lot 6 Click Score: 20    End of Session   Activity Tolerance: Patient limited by fatigue Patient left: in bed;with call bell/phone within reach;with family/visitor present;Other (comment)(sitting EOB) Nurse Communication: Mobility status PT Visit Diagnosis: Muscle weakness (generalized) (M62.81)    Time: 6767-2094 PT Time Calculation (min) (ACUTE ONLY): 20 min   Charges:   PT Evaluation $PT Eval Moderate Complexity: 1 Mod          Sherie Don, PT, DPT  Acute Rehabilitation Services Pager  [phone] Office [place] 04/29/2018, 1:23 PM

## 2018-04-29 NOTE — Progress Notes (Signed)
PT Cancellation Note  Patient Details Name: Marilyn [name] MRN: [id number] DOB: December 06, 1965   Cancelled Treatment:    Reason Eval/Treat Not Completed: Patient at procedure or test/unavailable. Pt off of the floor at vascular lab. PT will continue to follow acutely as available for evaluation.    [place] 04/29/2018, 9:08 AM

## 2018-04-29 NOTE — ED Notes (Signed)
[facility] here to transport pt 

## 2018-04-29 NOTE — Evaluation (Signed)
Occupational Therapy Evaluation Patient Details Name: Marilyn [name] MRN: [id number] DOB: [birth year] Today's Date: 04/29/2018    History of Present Illness Marilyn [name] is a 53 y.o. 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  CT and MRI negative.  Workup continued.    Clinical Impression   PTA patient independent ADLs, mobility.  Admitted for above and limited by problem list below, including decreased activity tolerance, R UE weakness and impaired coordination with intention tremors, increased HR.  Patient able to complete bed mobility with min assist, short distance mobility to restroom with min guard, toilet transfers and grooming with min guard assist for safety.  Limited by HR increasing to 135 with mobility. Patient reports her mother will be able to assist at dc as needed. Patient will benefit from continued OT services while admitted and after dc at [facility] level in order to optimize independence and safety with ADLs/mobility.     Follow Up Recommendations  Supervision/Assistance - 24 hour;Home health OT(24/7 initially)    Equipment Recommendations  3 in 1 bedside commode    Recommendations for Other Services PT consult     Precautions / Restrictions Precautions Precautions: Other (comment) Precaution Comments: watch HR  Restrictions Weight Bearing Restrictions: No      Mobility Bed Mobility Overal bed mobility: Needs Assistance Bed Mobility: Supine to Sit     Supine to sit: Min assist;HOB elevated     General bed mobility comments: min assist to elevate trunk, increased time and effort  Transfers Overall transfer level: Needs assistance Equipment used: None Transfers: Sit to/from Stand Sit to Stand: Min guard         General transfer  comment: min guard for safety and balance     Balance Overall balance assessment: Needs assistance Sitting-balance support: No upper extremity supported;Feet supported Sitting balance-Leahy Scale: Good     Standing balance support: No upper extremity supported;During functional activity Standing balance-Leahy Scale: Fair Standing balance comment: preference to UE support                           ADL either performed or assessed with clinical judgement   ADL Overall ADL's : Needs assistance/impaired     Grooming: Min guard;Standing;Wash/dry hands   Upper Body Bathing: Minimal assistance;Sitting   Lower Body Bathing: Moderate assistance;Sit to/from stand   Upper Body Dressing : Minimal assistance;Sitting   Lower Body Dressing: Moderate assistance;Sitting/lateral leans Lower Body Dressing Details (indicate cue type and reason): reports baseline assist needed with socks/shoes, decreased functional reach and min guard in standing  Toilet Transfer: Min guard;Ambulation;Grab bars;Regular Museum/gallery exhibitions officer and Hygiene: Min guard;Sit to/from stand       Functional mobility during ADLs: Min guard;Cueing for safety General ADL Comments: min guard for safeyt with in room mobility, decreased activity tolerance with SOB upon exertion, and decreased functional use of R UE     Vision Baseline Vision/History: No visual deficits Patient Visual Report: Other (comment)("i've been seeing things' ) Additional Comments: further assessment recommended, functional visual scanning and item location for functional tasks [facility] but reports "seeing things, and having floaters"      Perception     Praxis      Pertinent Vitals/Pain       Hand Dominance Right  Extremity/Trunk Assessment Upper Extremity Assessment Upper Extremity Assessment: Generalized weakness;RUE deficits/detail RUE Deficits / Details: grossly 3-/5 MMT, dysmetric and intention tremors  RUE  Sensation: WNL RUE Coordination: decreased gross motor;decreased fine motor   Lower Extremity Assessment Lower Extremity Assessment: Defer to PT evaluation       Communication Communication Communication: No difficulties   Cognition Arousal/Alertness: Awake/alert Behavior During Therapy: WFL for tasks assessed/performed Overall Cognitive Status: Within Functional Limits for tasks assessed                                 General Comments: reports intermittent confusion and memory loss (as why she doesnt drive), WFL for tasks assessed   General Comments  RA with oxygen saturation 98%, HR increased to 135 with ADL (toileting)     Exercises     Shoulder Instructions      Home Living Family/patient expects to be discharged to:: Private residence Living Arrangements: Parent Available Help at Discharge: Family;Available PRN/intermittently Type of Home: House Home Access: Ramped entrance     Home Layout: One level     Bathroom Shower/Tub: Tub/shower unit;Walk-in shower(typically does basin bathing )   Biochemist, clinical: Standard     Home Equipment: Bedside commode          Prior Functioning/Environment Level of Independence: Independent        Comments: independent ADLs, her mother assist with IADLs, pt completes med mgmt, - driving         OT Problem List: Decreased strength;Decreased activity tolerance;Impaired balance (sitting and/or standing);Decreased coordination;Decreased safety awareness;Decreased knowledge of use of DME or AE;Decreased knowledge of precautions;Impaired UE functional use;Cardiopulmonary status limiting activity;Obesity      OT Treatment/Interventions: Therapeutic exercise;Self-care/ADL training;Energy conservation;Neuromuscular education;DME and/or AE instruction;Therapeutic activities;Patient/family education;Balance training    OT Goals(Current goals can be found in the care plan section) Acute Rehab OT Goals Patient Stated  Goal: none stated Time For Goal Achievement: 05/13/18 Potential to Achieve Goals: Good  OT Frequency: Min 2X/week   Barriers to D/C:            Co-evaluation              AM-PAC OT "6 Clicks" Daily Activity     Outcome Measure Help from another person eating meals?: None Help from another person taking care of personal grooming?: A Little Help from another person toileting, which includes using toliet, bedpan, or urinal?: A Little Help from another person bathing (including washing, rinsing, drying)?: A Lot Help from another person to put on and taking off regular upper body clothing?: A Little Help from another person to put on and taking off regular lower body clothing?: A Lot 6 Click Score: 17   End of Session Nurse Communication: Mobility status  Activity Tolerance: Patient tolerated treatment well Patient left: with call bell/phone within reach;Other (comment)(seated EOB, PT in room)  OT Visit Diagnosis: Other abnormalities of gait and mobility (R26.89);Other symptoms and signs involving the nervous system (R29.898);Muscle weakness (generalized) (M62.81)                Time: 2263-3354 OT Time Calculation (min): 22 min Charges:  OT General Charges $OT Visit: 1 Visit OT Evaluation $OT Eval Moderate Complexity: [place], OT Acute Rehabilitation Services Pager [phone] Office [phone]   Delight Stare 04/29/2018, 11:53 AM

## 2018-04-29 NOTE — Progress Notes (Signed)
Responded to consult to check PAC. RN noted no blood return. Caps changed, flushes easily, no signs of displacement. Pt reports PAC is deep and sometimes difficult to get blood return. Able to obtain blood return in trendelenburg on right side. Notified RN.

## 2018-04-29 NOTE — Progress Notes (Signed)
AM labs not drawn because patient has no blood return from her chest port. IV team consulted

## 2018-04-29 NOTE — Progress Notes (Signed)
Carotid artery duplex completed. Refer to "CV Proc" under chart review to view preliminary results.  04/29/2018 9:18 AM Maudry Mayhew, MHA, RVT, RDCS, RDMS

## 2018-04-29 NOTE — H&P (Addendum)
History and Physical    Marilyn [name] BWG:[id number] DOB: 1965/07/15 DOA: 04/28/2018  Referring MD/NP/PA:   PCP: [name], FNP   Patient coming from:  The patient is coming from home.  At baseline, pt is independent for most of ADL.        Chief Complaint: Right arm numbness and weakness  HPI: Marilyn [name] is a 53 y.o. 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  Patient states that her symptoms started at about 7 PM.  She feels her right arm is weaker and numb.  She also has mild pain in the medial side of right upper arm.  No leg weakness or numbness.  She also reports decreased hearing in the right ear and right face numbness.  No vision change.  She states that she has chronic mild dry cough and mild shortness of breath, which has not changed.  No chest pain, fever or chills.  Denies nausea vomiting, diarrhea, abdominal pain, symptoms of UTI.  ED Course: pt was found to have WBC 5.9, INR 1.04, PTT 30, negative pregnancy test, pending alcohol level, potassium 3.4, creatinine and BUN normal, temperature normal, tachycardia, tachypnea, oxygen saturation 97% on room air.  CT of the head is negative for acute intracranial abnormalities.  Patient is placed on telemetry bed for observation.  Telemetry neurology, Dr. Tandy Gaw was consulted.  Review of Systems:   General: no fevers, chills, no body weight gain, has fatigue HEENT: no blurry vision, hearing changes or sore throat Respiratory: has mild dyspnea and coughing, no wheezing CV: no chest pain, no palpitations GI: no nausea, vomiting, abdominal pain, diarrhea, constipation GU: no dysuria, burning on urination, increased urinary frequency, hematuria  Ext: no leg edema Neuro: has right arm numbness and weakness, right facial  numbness and right ear hearing loss. Skin: no rash, no skin tear. MSK: No muscle spasm, no deformity, no limitation of range of movement in spin Heme: No easy bruising.  Travel history: No recent long distant travel.  Allergy: No Known Allergies  Past Medical History:  Diagnosis Date  . Arthritis   . Asthma   . Diabetes mellitus    Type II  . Hemorrhoids   . Hypercholesteremia   . Hypertension   . Hypothyroidism   . Metastatic lung cancer (metastasis from lung to other site) (HCC)    Lung, Mets to skin- right flank. CT angio 2019 suspicious for axillary, external iliac, right inguinal mets  . 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 NICM (nonischemic cardiomyopathy) ([place])    a. mild - EF 45-50% by echo 10/2016 but EF 52% by low risk nuc 11/2016. b. Echo 07/2017 - EF normal, mild AI, unremarkable echo otherwise.  . Obese   . Pneumonia 2017  . Sinus tachycardia    a. dates back to at least 2013, etiology unclear.    Past Surgical History:  Procedure Laterality Date  . CESAREAN SECTION    . COLONOSCOPY    . I/D  arm Right 2013  . IR FLUORO GUIDE PORT INSERTION RIGHT  02/18/2017  . IR GENERIC HISTORICAL  01/08/2016   IR US GUIDE VASC ACCESS RIGHT 01/08/2016 [name], DO [facility]-INTER[facility] RAD  . IR GENERIC HISTORICAL  01/08/2016   IR FLUORO GUIDE CV LINE RIGHT 01/08/2016 [name], DO [facility]  . IR  US GUIDE VASC ACCESS RIGHT  02/18/2017  . MULTIPLE EXTRACTIONS WITH ALVEOLOPLASTY N/A 03/25/2017   Procedure: MULTIPLE EXTRACTION WITH ALVEOLOPLASTY (teeth #s four, six, seven, eight, nine, ten, eleven, one, twenty-three, twenty-four, twenty-five, twenty-six, twenty-nine, thirty);  Surgeon: [name], [name];  Location: [place];  Service: Oral Surgery;  Laterality: N/A;  . TUBAL LIGATION    . US guided core needle biopsy     of right lower neck/supraclavicular lymph nodes    Social History:  reports that she has never smoked. She has never used smokeless tobacco.  She reports that she does not drink alcohol or use drugs.  Family History:  Family History  Problem Relation Age of Onset  . Heart failure Mother   . Hypertension Mother   . Cancer Neg Hx   . Rheumatologic disease Neg Hx      Prior to Admission medications   Medication Sig Start Date End Date Taking? Authorizing Provider  ASPIRIN LOW DOSE 81 MG EC tablet TAKE 1 TABLET BY MOUTH EVERY DAY Patient taking differently: Take 81 mg by mouth daily. Enteric Coated.  ** DO NOT CRUSH ** 03/19/18  Yes [name], MD  DULoxetine (CYMBALTA) 60 MG capsule TAKE 1 CAPSULE BY MOUTH EVERY DAY Patient taking differently: Take 60 mg by mouth daily.  12/13/17  Yes [name], MD  hydrOXYzine (ATARAX/VISTARIL) 10 MG tablet Take 1 tablet (10 mg total) by mouth 3 (three) times daily as needed for itching. 04/26/18  Yes [name], MD  insulin aspart (NOVOLOG) 100 UNIT/ML FlexPen As per sliding scale instructions Patient taking differently: Inject 0-10 Units into the skin 3 (three) times daily. As per sliding scale instructions 01/16/16  Yes [name], MD  Insulin Degludec (TRESIBA FLEXTOUCH) 200 UNIT/ML SOPN Inject 100 Units into the skin daily before breakfast.    Yes [provider]  levothyroxine (SYNTHROID, LEVOTHROID) 150 MCG tablet TAKE 1 TABLET BY MOUTH EVERY DAY BEFORE BREAKFAST Patient taking differently: Take 150 mcg by mouth daily before breakfast.  03/19/18  Yes [name], MD  lidocaine-prilocaine (EMLA) cream Apply 1 application topically as needed. 08/08/17  Yes [name], MD  metoprolol succinate (TOPROL-XL) 50 MG 24 hr tablet Take 1 tablet (50mg ) by mouth in the morning and 1/2 tablet (25mg ) in the evening. Take with or immediately following a meal. Patient taking differently: Take 25-50 mg by mouth See admin instructions. Take 1 tablet (50mg ) by mouth in the morning and 1/2 tablet (25mg ) in the evening. Take with or immediately  following a meal. 01/17/18  Yes [name], [name], MD  oxyCODONE-acetaminophen (PERCOCET) 5-325 MG tablet Take 1 tablet by mouth every 6 (six) hours as needed for moderate pain or severe pain. 04/26/18  Yes [name], MD  pregabalin (LYRICA) 50 MG capsule Take 50 mg by mouth 3 (three) times daily.   Yes [provider]  senna-docusate (SENNA S) 8.6-50 MG tablet Take 2 tablets by mouth 2 (two) times daily. May reduce to 2 tab po HS as needed once regular BM estabished Patient taking differently: Take 2 tablets by mouth 2 (two) times daily as needed (FOR CONSTIPATION). = 05/24/16  Yes [name], MD  Vitamins A & D (VITAMIN A & D) OINT Apply 1 application topically 2 (two) times daily. 03/06/18  Yes [name], MD  fluconazole (DIFLUCAN) 200 MG tablet Take 1 tablet (200 mg total) by mouth daily. Patient not taking: Reported on 04/28/2018 03/06/18  [name], MD  levalbuterol [facility]) 45 MCG/ACT inhaler Inhale 1-2 puffs into the lungs every 8 (eight) hours as needed for wheezing. 04/03/18   Gardenia Phlegm, NP  levofloxacin (LEVAQUIN) 500 MG tablet Take 1 tablet (500 mg total) by mouth daily. Patient not taking: Reported on 04/28/2018 03/06/18   [name], MD  naproxen (NAPROSYN) 500 MG tablet Take 1 tablet (500 mg total) by mouth 2 (two) times daily with a meal. Patient not taking: Reported on 04/28/2018 06/21/17   Varney Biles, MD  traZODone (DESYREL) 50 MG tablet Take 1-2 tablets (50-100 mg total) by mouth at bedtime as needed for sleep. Patient not taking: Reported on 04/28/2018 05/16/17   [name], MD  prochlorperazine (COMPAZINE) 10 MG tablet Take 1 tablet (10 mg total) by mouth every 6 (six) hours as needed (Nausea or vomiting). 01/01/16 03/03/16  [name], MD    Physical Exam: Vitals:   04/29/18 0000 04/29/18 0100 04/29/18 0104 04/29/18 0219  BP: (!) 143/91 (!) 153/88 (!) 153/88 137/84  Pulse: (!)  117 (!) 122 (!) 118 (!) 123  Resp: (!) 26 16    Temp:      TempSrc:      SpO2: 97% 95% 97% 98%  Weight:      Height:       General: Not in acute distress HEENT:       Eyes: PERRL, EOMI, no scleral icterus.       ENT: No discharge from the ears and nose, no pharynx injection, no tonsillar enlargement.        Neck: No JVD, no bruit, no mass felt. Heme: No neck lymph node enlargement. Cardiac: S1/S2, RRR, No murmurs, No gallops or rubs. Respiratory: No rales, wheezing, rhonchi or rubs. GI: Soft, nondistended, nontender, no rebound pain, no organomegaly, BS present. GU: No hematuria Ext: No pitting leg edema bilaterally. 2+DP/PT pulse bilaterally. Musculoskeletal: No joint deformities, No joint redness or warmth, no limitation of ROM in spin. Skin: No rashes.  Neuro: Alert, oriented X3, cranial nerves II-XII grossly intact. Muscle strength 3/5 in right arm and 5/5 in other all extremities, sensation to light touch intact. Brachial reflex 2+ bilaterally. Negative Babinski's sign. Psych: Patient is not psychotic, no suicidal or hemocidal ideation.  Labs on Admission: I have personally reviewed following labs and imaging studies  CBC: Recent Labs  Lab 04/28/18 2237  WBC 5.9  NEUTROABS 4.6  HGB 9.1*  HCT 29.7*  MCV 94.6  PLT 469   Basic Metabolic Panel: Recent Labs  Lab 04/28/18 2237  NA 138  K 3.4*  CL 105  CO2 27  GLUCOSE 298*  BUN 11  CREATININE 0.62  CALCIUM 8.2*   GFR: Estimated Creatinine Clearance: 119.8 mL/min (by C-G formula based on SCr of 0.62 mg/dL). Liver Function Tests: Recent Labs  Lab 04/28/18 2237  AST 15  ALT 11  ALKPHOS 77  BILITOT <0.1*  PROT 7.0  ALBUMIN 3.0*   No results for input(s): LIPASE, AMYLASE in the last 168 hours. No results for input(s): AMMONIA in the last 168 hours. Coagulation Profile: Recent Labs  Lab 04/28/18 2237  INR 1.04   Cardiac Enzymes: No results for input(s): CKTOTAL, CKMB, CKMBINDEX, TROPONINI in the last  168 hours. BNP (last 3 results) No results for input(s): PROBNP in the last 8760 hours. HbA1C: No results for input(s): HGBA1C in the last 72 hours. CBG: Recent Labs  Lab 04/28/18 2252  GLUCAP 270*   Lipid Profile:  No results for input(s): CHOL, HDL, LDLCALC, TRIG, CHOLHDL, LDLDIRECT in the last 72 hours. Thyroid Function Tests: No results for input(s): TSH, T4TOTAL, FREET4, T3FREE, THYROIDAB in the last 72 hours. Anemia Panel: No results for input(s): VITAMINB12, FOLATE, FERRITIN, TIBC, IRON, RETICCTPCT in the last 72 hours. Urine analysis:    Component Value Date/Time   COLORURINE STRAW (A) 07/28/2017 1741   APPEARANCEUR CLEAR 07/28/2017 1741   LABSPEC 1.005 07/28/2017 1741   PHURINE 6.0 07/28/2017 1741   GLUCOSEU NEGATIVE 07/28/2017 1741   HGBUR NEGATIVE 07/28/2017 1741   BILIRUBINUR NEGATIVE 07/28/2017 1741   KETONESUR NEGATIVE 07/28/2017 1741   PROTEINUR NEGATIVE 07/28/2017 1741   UROBILINOGEN 0.2 02/01/2013 2022   NITRITE NEGATIVE 07/28/2017 1741   LEUKOCYTESUR SMALL (A) 07/28/2017 1741   Sepsis Labs: @LABRCNTIP (procalcitonin:4,lacticidven:4) )No results found for this or any previous visit (from the past 240 hour(s)).   Radiological Exams on Admission: Ct Head Code Stroke Wo Contrast  Result Date: 04/28/2018 CLINICAL DATA:  Code stroke. Initial evaluation for acute right-sided numbness and weakness. EXAM: CT HEAD WITHOUT CONTRAST TECHNIQUE: Contiguous axial images were obtained from the base of the skull through the vertex without intravenous contrast. COMPARISON:  Prior MRI from 11/19/2016 FINDINGS: Brain: Cerebral volume within normal limits for patient age. No evidence for acute intracranial hemorrhage. No findings to suggest acute large vessel territory infarct. No mass lesion, midline shift, or mass effect. Ventricles are normal in size without evidence for hydrocephalus. No extra-axial fluid collection identified. Vascular: No hyperdense vessel identified. Skull:  Scalp soft tissues demonstrate no acute abnormality. Calvarium intact. Sinuses/Orbits: Globes and orbital soft tissues within normal limits. Visualized paranasal sinuses are clear. No mastoid effusion. ASPECTS [facility] Stroke Program Early CT Score) - Ganglionic level infarction (caudate, lentiform nuclei, internal capsule, insula, M1-M3 cortex): 7 - Supraganglionic infarction (M4-M6 cortex): 3 Total score (0-10 with 10 being normal): 10 IMPRESSION: 1. Negative head CT.  No acute intracranial abnormality identified 2. ASPECTS is 10. Critical Value/emergent results were called by telephone at the time of interpretation on 04/28/2018 at 10:38 pm to Dr. Julianne Rice , who verbally acknowledged these results. Electronically Signed   By: Jeannine Boga M.D.   On: 04/28/2018 22:40     EKG: Independently reviewed.  Sinus rhythm, QTC 455, low voltage, nonspecific T wave change   Assessment/Plan Principal Problem:   Left arm weakness and numbness Active Problems:   Diabetes mellitus without complication (HCC)   Adenocarcinoma of left lung, stage 3 (HCC)   Hypothyroidism   Essential hypertension   HLD (hyperlipidemia)   Asthma   Depression   Chronic diastolic CHF (congestive heart failure) (HCC)   Right arm weakness   Left arm weakness and numbness, right facial numbness and right ear hearing loss: Etiology is not clear.  Potential differential diagnosis include stroke/TIA and C-spine neuropathy.  Pending neurology, Dr. Tandy Gaw was consulted, who recommended "Rule out lacunar infarct of the left hemispheric deep brain structures. Consider lunc CA mets, as well".   - will place on tele bed for obs - Obtain MRI/MRA  - MRI-C spin - Check carotid dopplers  - ASA - start lipitor 40 mg daily - fasting lipid panel and HbA1c  - 2D transthoracic echocardiography  - swallowing screen. If fails, will get SLP - Check UDS  - PT/OT consult - may need to consult [facility] neurology if MRI shows  stroke  Diabetes mellitus without complications: E9F 8.9 on 07/29/2017, poorly controlled.  Patient is taking NovoLog and degludec at home. -  Decrease degludec dose from 100 to 70 unit daily -SSI  Metastasized adenocarcinoma of left lung, stage 3 [facility]): Patient had 2 weeks of radiation therapy.  Currently patient is receiving chemotherapy, last dose was on 04/17/2018.  Patient is following up with Dr. Irene Limbo. -Follow-up with Dr. Irene Limbo -will add Dr. Irene Limbo to teatment team  Hypothyroidism: Last TSH was2.28 on 04/03/18  -Continue home Synthroid  Essential hypertension: -Continue metoprolol -IV hydralazine PRN for SBP>200  HLD (hyperlipidemia): not taking med at home -started Lipitor  Asthma: stable -prn xopenex  Depression:  -continue home Cymbalta  Chronic diastolic CHF (congestive heart failure) ([place]): 2D echo on 07/28/2017 showed EF of 55 to 60%.  Patient does not have leg edema or DVT.  CHF seems to be compensated.  Patient is not taking diuretics at home. - Check BNP      DVT ppx:  SQ Lovenox Code Status: Full code Family Communication: Yes, patient's daughter   at bed side Disposition Plan:  Anticipate discharge back to previous home environment Consults called:  [place] neurology, Dr. Sheppard Coil Admission status: Obs / tele    Date of Service 04/29/2018    [facility]   If 7PM-7AM, please contact night-coverage [email or web address] Password [email or web address] 04/29/2018, 2:26 AM

## 2018-04-30 ENCOUNTER — Other Ambulatory Visit (HOSPITAL_COMMUNITY): Payer: Self-pay

## 2018-04-30 DIAGNOSIS — N390 Urinary tract infection, site not specified: Secondary | ICD-10-CM | POA: Diagnosis present

## 2018-04-30 DIAGNOSIS — M4802 Spinal stenosis, cervical region: Secondary | ICD-10-CM | POA: Diagnosis present

## 2018-04-30 DIAGNOSIS — E669 Obesity, unspecified: Secondary | ICD-10-CM | POA: Diagnosis present

## 2018-04-30 DIAGNOSIS — R29898 Other symptoms and signs involving the musculoskeletal system: Secondary | ICD-10-CM | POA: Diagnosis not present

## 2018-04-30 LAB — BASIC METABOLIC PANEL
Anion gap: 7 (ref 5–15)
BUN: 7 mg/dL (ref 6–20)
CO2: 28 mmol/L (ref 22–32)
CREATININE: 0.64 mg/dL (ref 0.44–1.00)
Calcium: 8.8 mg/dL — ABNORMAL LOW (ref 8.9–10.3)
Chloride: 104 mmol/L (ref 98–111)
GFR calc Af Amer: >60 mL/min (ref >60)
GFR calc non Af Amer: >60 mL/min (ref >60)
Glucose, Bld: 125 mg/dL — ABNORMAL HIGH (ref 70–99)
Potassium: 3.9 mmol/L (ref 3.5–5.1)
Sodium: 139 mmol/L (ref 135–145)

## 2018-04-30 LAB — GLUCOSE, CAPILLARY
GLUCOSE-CAPILLARY: 211 mg/dL — AB (ref 70–99)
Glucose-Capillary: 116 mg/dL — ABNORMAL HIGH (ref 70–99)

## 2018-04-30 MED ORDER — NITROFURANTOIN MONOHYD MACRO 100 MG PO CAPS: 100.0000 mg | ORAL_CAPSULE | Freq: Two times a day (BID) | ORAL | 0 refills | Status: DC

## 2018-04-30 MED ORDER — ATORVASTATIN CALCIUM 40 MG PO TABS: 40.0000 mg | ORAL_TABLET | Freq: Every day | ORAL | 0 refills | Status: DC

## 2018-04-30 MED ORDER — ATORVASTATIN CALCIUM 40 MG PO TABS: 40.0000 mg | ORAL_TABLET | Freq: Every day | ORAL | 0 refills | Status: AC

## 2018-04-30 MED ORDER — HEPARIN SOD (PORK) LOCK FLUSH 100 UNIT/ML IV SOLN
500.0000 [IU] | INTRAVENOUS | Status: AC | PRN
  Administered 2018-04-30: 500 [IU]

## 2018-04-30 NOTE — Discharge Summary (Signed)
Discharge Summary  Marilyn [name] PPJ:[id number] DOB: Jan 12, 1966  PCP: Marilyn Drafts, FNP  Admit date: 04/28/2018 Discharge date: 04/30/2018  Time spent: 30 minutes  Recommendations for Outpatient Follow-up:  1. Follow-up PCP outpatient physical therapy  Discharge Diagnoses:  Active Hospital Problems   Diagnosis Date Noted  . Left arm weakness and numbness 04/29/2018  . Acute lower UTI 04/30/2018  . Obese 04/30/2018  . Degenerative cervical spinal stenosis 04/30/2018  . HLD (hyperlipidemia) 04/29/2018  . Asthma 04/29/2018  . Depression 04/29/2018  . Chronic diastolic CHF (congestive heart failure) ([place]) 04/29/2018  . Right arm weakness 04/29/2018  . Essential hypertension   . Hypothyroidism 08/22/2016  . Adenocarcinoma of left lung, stage 3 ([place]) 01/01/2016  . Diabetes mellitus without complication ([place]) 58/12/9831    Resolved Hospital Problems  No resolved problems to display.    Discharge Condition: Improved  Diet recommendation: Cardiac reduce calorie  Vitals:   04/30/18 0509 04/30/18 0753  BP: 109/75 112/84  Pulse: 96 (!) 101  Resp: 18 16  Temp: 98 F (36.7 C) 98.3 F (36.8 C)  SpO2: 95% 97%    History of present illness:   [name] is a 53 year old female with multiple medical history including hypertension hyperlipidemia diabetes mellitus asthma hypothyroidism depression congestive heart failure metastasized renal carcinoma of the lung stage III morbid obesity came to the ER for evaluation of right arm weakness and numbness.  Tele-neurology was obtained they advised admitting and having a full stroke work-up stroke work-up is in progress her MRI/MRA are negative CT scan was negative 2D echo still pending   Hospital Course:  Principal Problem:   Left arm weakness and numbness Active Problems:   Diabetes mellitus without complication (HCC)   Adenocarcinoma of left lung, stage 3 (HCC)   Hypothyroidism   Essential hypertension   HLD  (hyperlipidemia)   Asthma   Depression   Chronic diastolic CHF (congestive heart failure) (HCC)   Right arm weakness   Acute lower UTI   Obese   Degenerative cervical spinal stenosis  Patient had right upper extremity numbness that has resolved MRA and CT scan were all negative she underwent physical therapy as well as Occupational Therapy.  He was recommended for her to have physical therapy.  She had a CT scan of her cervical spine that was positive for stenosis which may be contributing to the numbness in her hand and arm.  Symptoms have resolved  Procedures:  None  Consultations:  Telemetry neurology  Discharge Exam: BP 112/84 (BP Location: Left Arm)   Pulse (!) 101   Temp 98.3 F (36.8 C) (Oral)   Resp 16   Ht 5\' 9"  (1.753 m)   Wt 126.1 kg   LMP 01/21/2016 Comment: pt signed preg test waiver 04/16/16   SpO2 97%   BMI 41.05 kg/m   General: Obese alert oriented x3 Cardiovascular: Regular rate and rhythm no lower extremity edema Respiratory: Clear to auscultation Neurology: Neurology exam nonfocal Lower extremity: No edema  Discharge Instructions You were cared for by a hospitalist during your hospital stay. If you have any questions about your discharge medications or the care you received while you were in the hospital after you are discharged, you can call the unit and asked to speak with the hospitalist on call if the hospitalist that took care of you is not available. Once you are discharged, your primary care physician will handle any further medical issues. Please note that NO REFILLS for any discharge  medications will be authorized once you are discharged, as it is imperative that you return to your primary care physician (or establish a relationship with a primary care physician if you do not have one) for your aftercare needs so that they can reassess your need for medications and monitor your lab values.  Discharge Instructions    Diet - low sodium heart healthy    Complete by:  As directed    Reduce calorie diet   Discharge instructions   Complete by:  As directed    Follow-up with primary care provider.  2D echo was not done due to 1 having been done July 28, 2017.  Your primary care provider mind order the echo outpatient.  Follow-up with oncology concerning right arm swelling.   Increase activity slowly   Complete by:  As directed      Allergies as of 04/30/2018   No Known Allergies     Medication List    STOP taking these medications   levofloxacin 500 MG tablet Commonly known as:  LEVAQUIN     TAKE these medications   ASPIRIN LOW DOSE 81 MG EC tablet Generic drug:  aspirin TAKE 1 TABLET BY MOUTH EVERY DAY What changed:    how much to take  additional instructions   atorvastatin 40 MG tablet Commonly known as:  LIPITOR Take 1 tablet (40 mg total) by mouth daily at 6 PM.   DULoxetine 60 MG capsule Commonly known as:  CYMBALTA TAKE 1 CAPSULE BY MOUTH EVERY DAY What changed:  how much to take   fluconazole 200 MG tablet Commonly known as:  DIFLUCAN Take 1 tablet (200 mg total) by mouth daily.   hydrOXYzine 10 MG tablet Commonly known as:  ATARAX/VISTARIL Take 1 tablet (10 mg total) by mouth 3 (three) times daily as needed for itching.   insulin aspart 100 UNIT/ML FlexPen Commonly known as:  NOVOLOG As per sliding scale instructions What changed:    how much to take  how to take this  when to take this   levalbuterol 45 MCG/ACT inhaler Commonly known as:  XOPENEX HFA Inhale 1-2 puffs into the lungs every 8 (eight) hours as needed for wheezing.   levothyroxine 150 MCG tablet Commonly known as:  SYNTHROID, LEVOTHROID TAKE 1 TABLET BY MOUTH EVERY DAY BEFORE BREAKFAST What changed:  See the new instructions.   lidocaine-prilocaine cream Commonly known as:  EMLA Apply 1 application topically as needed.   metoprolol succinate 50 MG 24 hr tablet Commonly known as:  TOPROL-XL Take 1 tablet (50mg ) by mouth in  the morning and 1/2 tablet (25mg ) in the evening. Take with or immediately following a meal. What changed:    how much to take  how to take this  when to take this   naproxen 500 MG tablet Commonly known as:  NAPROSYN Take 1 tablet (500 mg total) by mouth 2 (two) times daily with a meal.   nitrofurantoin (macrocrystal-monohydrate) 100 MG capsule Commonly known as:  MACROBID Take 1 capsule (100 mg total) by mouth every 12 (twelve) hours.   oxyCODONE-acetaminophen 5-325 MG tablet Commonly known as:  PERCOCET Take 1 tablet by mouth every 6 (six) hours as needed for moderate pain or severe pain.   pregabalin 50 MG capsule Commonly known as:  LYRICA Take 50 mg by mouth 3 (three) times daily.   senna-docusate 8.6-50 MG tablet Commonly known as:  SENNA S Take 2 tablets by mouth 2 (two) times daily. May reduce to 2  tab po HS as needed once regular BM estabished What changed:    when to take this  reasons to take this  additional instructions   traZODone 50 MG tablet Commonly known as:  DESYREL Take 1-2 tablets (50-100 mg total) by mouth at bedtime as needed for sleep.   TRESIBA FLEXTOUCH 200 UNIT/ML Sopn Generic drug:  Insulin Degludec Inject 100 Units into the skin daily before breakfast.   Vitamin A & D Oint Apply 1 application topically 2 (two) times daily.      No Known Allergies    The results of significant diagnostics from this hospitalization (including imaging, microbiology, ancillary and laboratory) are listed below for reference.    Significant Diagnostic Studies: Dg Chest 2 View  Result Date: 04/03/2018 CLINICAL DATA:  Increasing shortness of breath over the past week. Metastatic lung cancer. EXAM: CHEST - 2 VIEW COMPARISON:  07/29/2017 and chest CT dated 03/01/2018. FINDINGS: Normal sized heart. Stable post radiation changes and scarring on the right. Interval mild left lower lobe atelectasis. Right jugular porta catheter tip in the proximal superior  vena cava. Mild scoliosis. Mild thoracic spine degenerative changes. IMPRESSION: Interval mild left lower lobe atelectasis. Electronically Signed   By: Claudie Revering M.D.   On: 04/03/2018 14:43   Ct Angio Chest Pe W Or Wo Contrast  Result Date: 04/03/2018 CLINICAL DATA:  Chest pain and shortness of breath. Lung carcinoma receiving chemotherapy EXAM: CT ANGIOGRAPHY CHEST WITH CONTRAST TECHNIQUE: Multidetector CT imaging of the chest was performed using the standard protocol during bolus administration of intravenous contrast. Multiplanar CT image reconstructions and MIPs were obtained to evaluate the vascular anatomy. CONTRAST:  110mL ISOVUE-370 IOPAMIDOL (ISOVUE-370) INJECTION 76% COMPARISON:  Chest CT March 01, 2018; chest radiograph April 03, 2018 FINDINGS: Cardiovascular: There is no demonstrable pulmonary embolus. There is no thoracic aortic aneurysm or dissection. There is no pericardial effusion or pericardial thickening. Port-A-Cath tip is at the cavoatrial junction. Mediastinum/Nodes: Limited visualization of thyroid. There is extensive right-sided subpectoral and axillary adenopathy, also present on prior study. The largest lymph node in this area is in the right axillary region measuring 5.4 x 3.2 cm, unchanged from most recent study. No progression of adenopathy in this area is evident. There is adenopathy in the right para carinal region. A lymph node anterior to the carina on the right measures 2.2 x 1.7 cm, stable. There are prominent subcarinal lymph nodes, largest measuring 1.3 x 1.2 cm. No new adenopathy is evident. No esophageal lesions are appreciable. There are several conglomerated lymph nodes in this area vitamin this shunt Lungs/Pleura: There is a right pleural effusion, larger than on recent study. There is probable post radiation therapy change in the right perihilar regions with areas of retraction and localized bronchiectasis. There is also consolidation and bronchiectatic change  in the right lower lobe, stable. This appearance is similar to most recent study. No new parenchymal lung opacity evident. Upper Abdomen: Visualized upper abdominal structures appear unremarkable. Musculoskeletal: No blastic or lytic bone lesions are evident. There are no appreciable chest wall lesions. Review of the MIP images confirms the above findings. IMPRESSION: 1. No demonstrable pulmonary embolus. No thoracic aortic aneurysm or dissection. 2. Stable extensive adenopathy, most marked in the right subpectoral and axillary regions. No new adenopathy. 3. Consolidation right perihilar and right lower lobe regions. Probable post radiation therapy changes right perihilar region. Lower lobe bronchiectatic change. No new parenchymal lung opacity. There is a right pleural effusion which is slightly larger than 1  month prior. Electronically Signed   By: Lowella Grip III M.D.   On: 04/03/2018 16:56   Mr Jeri Cos JQ Contrast  Result Date: 04/29/2018 CLINICAL DATA:  Left arm weakness, right facial numbness. Metastatic lung cancer. EXAM: MRI HEAD WITHOUT AND WITH CONTRAST MRA HEAD WITHOUT CONTRAST TECHNIQUE: Multiplanar, multiecho pulse sequences of the brain and surrounding structures were obtained without and with intravenous contrast. Angiographic images of the head were obtained using MRA technique without contrast. CONTRAST:  10 mL Gadavist COMPARISON:  None. FINDINGS: MRI HEAD FINDINGS BRAIN: There is no acute infarct, acute hemorrhage or mass effect. The midline structures are normal. There are no old infarcts. The white matter signal is normal for the patient's age. The CSF spaces are normal for age, with no hydrocephalus. Susceptibility-sensitive sequences show no chronic microhemorrhage or superficial siderosis. SKULL AND UPPER CERVICAL SPINE: The visualized skull base, calvarium, upper cervical spine and extracranial soft tissues are normal. SINUSES/ORBITS: No fluid levels or advanced mucosal thickening.  No mastoid or middle ear effusion. The orbits are normal. MRA HEAD FINDINGS POSTERIOR CIRCULATION: --Basilar artery: Normal. --Posterior cerebral arteries: Normal.  Bilateral fetal origins. --Superior cerebellar arteries: Normal. --Inferior cerebellar arteries: Normal posterior inferior cerebellar arteries. Anterior inferior cerebellar arteries are not clearly visible, but this is not uncommon. ANTERIOR CIRCULATION: --Intracranial internal carotid arteries: Normal. --Anterior cerebral arteries: Normal. Both A1 segments are present. Patent anterior communicating artery. --Middle cerebral arteries: Normal. --Posterior communicating arteries: Present bilaterally. IMPRESSION: 1. Normal MRI of the brain. 2. Normal intracranial MRA. Electronically Signed   By: Ulyses Jarred M.D.   On: 04/29/2018 06:03   Mr Cervical Spine W Wo Contrast  Result Date: 04/29/2018 CLINICAL DATA:  Left arm weakness and numbness. Metastatic lung cancer. EXAM: MRI CERVICAL SPINE WITHOUT AND WITH CONTRAST TECHNIQUE: Multiplanar and multiecho pulse sequences of the cervical spine, to include the craniocervical junction and cervicothoracic junction, were obtained without and with intravenous contrast. CONTRAST:  10 mL Gadavist COMPARISON:  None. FINDINGS: Alignment: Normal. Vertebrae: No focal marrow lesion. No compression fracture or evidence of discitis osteomyelitis. Cord: Normal caliber and signal. Posterior Fossa, vertebral arteries, paraspinal tissues: Visualized posterior fossa is normal. Vertebral artery flow voids are preserved. No prevertebral effusion. Disc levels: Sagittal imaging includes the atlantoaxial joint to the level of the T3-4 disc space, with axial imaging of the disc spaces from C2-3 to C7-T1. Atlantoaxial articulation is normal. C2-3: Normal. C3-4: Normal. C4-5: Normal. C5-6: Small central disc protrusion with mild spinal canal stenosis. No neural foraminal stenosis. C6-7: Left subarticular disc protrusion causes mild  spinal canal stenosis and moderate left neural foraminal stenosis. Right neural foramen is patent. C7-T1: Normal. Sagittal imaging of the T1-T4 levels is normal. IMPRESSION: 1. No cervical metastatic disease. 2. 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 3. Unchanged moderate left neural foraminal stenosis at C6-7. Electronically Signed   By: Ulyses Jarred M.D.   On: 04/29/2018 06:09   Mr Jodene Nam Head Wo Contrast  Result Date: 04/29/2018 CLINICAL DATA:  Left arm weakness, right facial numbness. Metastatic lung cancer. EXAM: MRI HEAD WITHOUT AND WITH CONTRAST MRA HEAD WITHOUT CONTRAST TECHNIQUE: Multiplanar, multiecho pulse sequences of the brain and surrounding structures were obtained without and with intravenous contrast. Angiographic images of the head were obtained using MRA technique without contrast. CONTRAST:  10 mL Gadavist COMPARISON:  None. FINDINGS: MRI HEAD FINDINGS BRAIN: There is no acute infarct, acute hemorrhage or mass effect. The midline structures are normal. There are no old infarcts.  The white matter signal is normal for the patient's age. The CSF spaces are normal for age, with no hydrocephalus. Susceptibility-sensitive sequences show no chronic microhemorrhage or superficial siderosis. SKULL AND UPPER CERVICAL SPINE: The visualized skull base, calvarium, upper cervical spine and extracranial soft tissues are normal. SINUSES/ORBITS: No fluid levels or advanced mucosal thickening. No mastoid or middle ear effusion. The orbits are normal. MRA HEAD FINDINGS POSTERIOR CIRCULATION: --Basilar artery: Normal. --Posterior cerebral arteries: Normal.  Bilateral fetal origins. --Superior cerebellar arteries: Normal. --Inferior cerebellar arteries: Normal posterior inferior cerebellar arteries. Anterior inferior cerebellar arteries are not clearly visible, but this is not uncommon. ANTERIOR CIRCULATION: --Intracranial internal carotid arteries: Normal. --Anterior cerebral arteries: Normal. Both  A1 segments are present. Patent anterior communicating artery. --Middle cerebral arteries: Normal. --Posterior communicating arteries: Present bilaterally. IMPRESSION: 1. Normal MRI of the brain. 2. Normal intracranial MRA. Electronically Signed   By: Ulyses Jarred M.D.   On: 04/29/2018 06:03   Ct Head Code Stroke Wo Contrast  Result Date: 04/28/2018 CLINICAL DATA:  Code stroke. Initial evaluation for acute right-sided numbness and weakness. EXAM: CT HEAD WITHOUT CONTRAST TECHNIQUE: Contiguous axial images were obtained from the base of the skull through the vertex without intravenous contrast. COMPARISON:  Prior MRI from 11/19/2016 FINDINGS: Brain: Cerebral volume within normal limits for patient age. No evidence for acute intracranial hemorrhage. No findings to suggest acute large vessel territory infarct. No mass lesion, midline shift, or mass effect. Ventricles are normal in size without evidence for hydrocephalus. No extra-axial fluid collection identified. Vascular: No hyperdense vessel identified. Skull: Scalp soft tissues demonstrate no acute abnormality. Calvarium intact. Sinuses/Orbits: Globes and orbital soft tissues within normal limits. Visualized paranasal sinuses are clear. No mastoid effusion. ASPECTS [facility] Stroke Program Early CT Score) - Ganglionic level infarction (caudate, lentiform nuclei, internal capsule, insula, M1-M3 cortex): 7 - Supraganglionic infarction (M4-M6 cortex): 3 Total score (0-10 with 10 being normal): 10 IMPRESSION: 1. Negative head CT.  No acute intracranial abnormality identified 2. ASPECTS is 10. Critical Value/emergent results were called by telephone at the time of interpretation on 04/28/2018 at 10:38 pm to Dr. Julianne Rice , who verbally acknowledged these results. Electronically Signed   By: Jeannine Boga M.D.   On: 04/28/2018 22:40   Vas US Carotid (at [place] Only)  Result Date: 04/29/2018 Carotid Arterial Duplex Study Indications:  CVA. Risk  Factors: Hypertension, hyperlipidemia, Diabetes. Performing Technologist: Maudry Mayhew MHA, RDMS, RVT, RDCS  Examination Guidelines: A complete evaluation includes B-mode imaging, spectral Doppler, color Doppler, and power Doppler as needed of all accessible portions of each vessel. Bilateral testing is considered an integral part of a complete examination. Limited examinations for reoccurring indications may be performed as noted.  Right Carotid Findings: +----------+--------+--------+--------+-----------------------+--------+           PSV cm/sEDV cm/sStenosisDescribe               Comments +----------+--------+--------+--------+-----------------------+--------+ CCA Prox  73      16                                              +----------+--------+--------+--------+-----------------------+--------+ CCA Distal58      24              smooth and heterogenous         +----------+--------+--------+--------+-----------------------+--------+ ICA Prox  58      23  smooth and heterogenous         +----------+--------+--------+--------+-----------------------+--------+ ICA Distal63      23                                              +----------+--------+--------+--------+-----------------------+--------+ ECA       71      12                                              +----------+--------+--------+--------+-----------------------+--------+ +----------+--------+-------+--------------+-------------------+           PSV cm/sEDV cmsDescribe      Arm Pressure (mmHG) +----------+--------+-------+--------------+-------------------+ Subclavian               Not identified                    +----------+--------+-------+--------------+-------------------+ +---------+--------+--------+-------------------+ VertebralPSV cm/sEDV cm/sUnable to visualize +---------+--------+--------+-------------------+ Anechoic area in the right lateral neck (area of pain)  measuring 2.1cm. Suggestive of possible lymph node versus unknown etiology.  Left Carotid Findings: +----------+--------+-------+--------+----------------------+------------------+           PSV cm/sEDV    StenosisDescribe              Comments                             cm/s                                                    +----------+--------+-------+--------+----------------------+------------------+ CCA Prox  119     31                                                      +----------+--------+-------+--------+----------------------+------------------+ CCA Distal69      22                                   intimal thickening +----------+--------+-------+--------+----------------------+------------------+ ICA Prox  121     39             smooth and                                                                heterogenous                             +----------+--------+-------+--------+----------------------+------------------+ ICA Distal49      21                                                      +----------+--------+-------+--------+----------------------+------------------+  ECA       91      19                                                      +----------+--------+-------+--------+----------------------+------------------+ +----------+--------+--------+----------------+-------------------+ SubclavianPSV cm/sEDV cm/sDescribe        Arm Pressure (mmHG) +----------+--------+--------+----------------+-------------------+           55              Multiphasic, WNL                    +----------+--------+--------+----------------+-------------------+ +---------+--------+--+--------+--+---------+ VertebralPSV cm/s31EDV cm/s11Antegrade +---------+--------+--+--------+--+---------+ There are multiple hypoechoic heterogenous areas in the left clavicular area, suggestive of possible lymph node versus unknown etiology.  Summary: Right  Carotid: Velocities in the right ICA are consistent with a 1-39% stenosis. Left Carotid: Velocities in the left ICA are consistent with a 1-39% stenosis. Vertebrals:  Left vertebral artery demonstrates antegrade flow. Right vertebral              artery was not visualized. Subclavians: Right subclavian artery was not visualized. Normal flow              hemodynamics were seen in the left subclavian artery. *See table(s) above for measurements and observations.  Electronically signed by Servando Snare MD on 04/29/2018 at 1:30:01 PM.    Final     Microbiology: No results found for this or any previous visit (from the past 240 hour(s)).   Labs: Basic Metabolic Panel: Recent Labs  Lab 04/28/18 2237 04/30/18 0800  NA 138 139  K 3.4* 3.9  CL 105 104  CO2 27 28  GLUCOSE 298* 125*  BUN 11 7  CREATININE 0.62 0.64  CALCIUM 8.2* 8.8*   Liver Function Tests: Recent Labs  Lab 04/28/18 2237  AST 15  ALT 11  ALKPHOS 77  BILITOT <0.1*  PROT 7.0  ALBUMIN 3.0*   No results for input(s): LIPASE, AMYLASE in the last 168 hours. No results for input(s): AMMONIA in the last 168 hours. CBC: Recent Labs  Lab 04/28/18 2237  WBC 5.9  NEUTROABS 4.6  HGB 9.1*  HCT 29.7*  MCV 94.6  PLT 246   Cardiac Enzymes: No results for input(s): CKTOTAL, CKMB, CKMBINDEX, TROPONINI in the last 168 hours. BNP: BNP (last 3 results) Recent Labs    07/27/17 0158  BNP 43.2    ProBNP (last 3 results) No results for input(s): PROBNP in the last 8760 hours.  CBG: Recent Labs  Lab 04/29/18 0611 04/29/18 1206 04/29/18 1653 04/29/18 2113 04/30/18 0624  GLUCAP 345* 274* 213* 163* 116*       Signed:  Cristal Deer, MD [facility] 04/30/2018, 9:58 AM

## 2018-04-30 NOTE — Care Management Note (Signed)
Case Management Note  Patient Details  Name: Marilyn [name] MRN: [id number] Date of Birth: 07/25/1965  Subjective/Objective:                    Action/Plan:  Requested MD to print out Rx's, her pharmacy is closed over the weekend. Prices on good Rx are less than $20 at [facility]. Patient made aware. [facility] notifed to screen for charity [facility]. No DME needs. No other CM needs Expected Discharge Date:  04/30/18               Expected Discharge Plan:  [place]  In-House Referral:     Discharge planning Services  CM Consult  Post Acute Care Choice:    Choice offered to:     DME Arranged:    DME Agency:     HH Arranged:  PT [place]:  [place]  Status of Service:  Completed, signed off  If discussed at [place] of Stay Meetings, dates discussed:    Additional Comments:  Carles Collet, RN 04/30/2018, 11:17 AM

## 2018-04-30 NOTE — Progress Notes (Addendum)
Occupational Therapy Treatment Patient Details Name: Marilyn [name] MRN: [id number] DOB: [birth year] Today's Date: 04/30/2018    History of present illness Marilyn [name] is a 53 y.o. 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  CT and MRI negative.  Workup continued.    OT comments  Patient progressing slowly.  Improving R sided tremors, present but improved control during self care tasks.  Issued LB AE for self care, good return demonstration with use of long sponge, reacher, and sock aide.   Reviewed energy conservation techniques and provided handout for recommendations, pt verbalized understanding. Would benefit from continued education of techniques and AE. EOB activities today only, HR ranged from 101-110.  Highly recommend HHOT. Will continue to follow.    Follow Up Recommendations  Supervision/Assistance - 24 hour;Home health OT(inital 24/7)    Equipment Recommendations  3 in 1 bedside commode    Recommendations for Other Services      Precautions / Restrictions Precautions Precautions: Other (comment) Precaution Comments: watch HR  Restrictions Weight Bearing Restrictions: No       Mobility Bed Mobility Overal bed mobility: Needs Assistance Bed Mobility: Supine to Sit     Supine to sit: Supervision     General bed mobility comments: no assist required today  Transfers                      Balance Overall balance assessment: Needs assistance Sitting-balance support: No upper extremity supported;Feet supported Sitting balance-Leahy Scale: Good                                     ADL either performed or assessed with clinical judgement   ADL Overall ADL's : Needs assistance/impaired             Lower Body  Bathing: Minimal assistance;Sit to/from stand;Cueing for compensatory techniques;With adaptive equipment Lower Body Bathing Details (indicate cue type and reason): reviewed safety and energy conservation technique, bathing seated on [place]; plans to basin bathe at sink, issued and educated on long handled sponge for independence      Lower Body Dressing: Minimal assistance;Cueing for compensatory techniques;With adaptive equipment;Sit to/from stand Lower Body Dressing Details (indicate cue type and reason): issued and educated on AE for LB self care, return demosntrated use of sock aide and reacher                General ADL Comments: session focused on LB ADL and energy conservation; provided handouts and reviewed techniques to maximize independence, safety and activity tolerance at home; pt verbalizes understanding      Vision       Perception     Praxis      Cognition Arousal/Alertness: Awake/alert Behavior During Therapy: WFL for tasks assessed/performed Overall Cognitive Status: Within Functional Limits for tasks assessed                                          Exercises     Shoulder Instructions       General Comments HR EOB activities up to 110    Pertinent Vitals/ Pain  Pain Assessment: No/denies pain  Home Living                                          Prior Functioning/Environment              Frequency  Min 2X/week        Progress Toward Goals  OT Goals(current goals can now be found in the care plan section)  Progress towards OT goals: Progressing toward goals  Acute Rehab OT Goals Patient Stated Goal: to feel better Time For Goal Achievement: 05/13/18 Potential to Achieve Goals: Good  Plan Discharge plan remains appropriate;Frequency remains appropriate    Co-evaluation                 AM-PAC OT "6 Clicks" Daily Activity     Outcome Measure   Help from another person eating meals?:  None Help from another person taking care of personal grooming?: A Little Help from another person toileting, which includes using toliet, bedpan, or urinal?: A Little Help from another person bathing (including washing, rinsing, drying)?: A Little Help from another person to put on and taking off regular upper body clothing?: A Little Help from another person to put on and taking off regular lower body clothing?: A Little 6 Click Score: 19    End of Session    OT Visit Diagnosis: Other abnormalities of gait and mobility (R26.89);Other symptoms and signs involving the nervous system (R29.898);Muscle weakness (generalized) (M62.81)   Activity Tolerance Patient tolerated treatment well   Patient Left with call bell/phone within reach;Other (comment)(seated EOB )   Nurse Communication Mobility status        Time: 0354-6568 OT Time Calculation (min): 26 min  Charges: OT General Charges $OT Visit: 1 Visit OT Treatments $Self Care/Home Management : 23-37 mins  Delight Stare, [place] Pager [phone] Office [phone]     Delight Stare 04/30/2018, 12:43 PM
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
## 2018-05-01 NOTE — Progress Notes (Signed)
Marilyn [name]    HEMATOLOGY/ONCOLOGY CLINIC NOTE  Date of Service: 05/02/18     Patient Care Team: [name], FNP as PCP - General (Nurse Practitioner) Pixie Casino, MD as PCP - Cardiology (Cardiology)  CHIEF COMPLAINTS  F/u for continued management of metastatic lung adenocarcinoma  Diagnosis Metastatic Lung Adenocarcinoma with rt flank subcutaneous metastasis.  Current treatment:  Nivolumab q2 weeks Palliative RT to pain rt inguinal mass   PreviousTreatment -Concurrent Chemo-radiation with Carboplatin/Taxol. Completed RT on 02/13/2016 and has then completed 2 cycles of carboplatin + taxol. -Started Maintenance Durvalumab from  05/24/2016 q2weeks, switched to Nivolumab q2weeks on 12/13/16 once metastatic disease noted -s/p palliative RT to rt sided Subcutaneous metastatic mass.    HISTORY OF PRESENTING ILLNESS: Plz see my previous note for details on initial presentation.  INTERVAL HISTORY   Marilyn [name] presents to the office today for followup of her metastatic lung cancer. The patient's last visit with [place] was on 03/06/18, and she saw my colleague Wilber Bihari, NP in the interim. The pt reports that she is doing well overall.   In the interim, the pt completed RT on 04/04/18.   The pt reports that she is having right arm and breast swelling. Her right arm swelling began 04/28/18, for which she presented to the ED. The pt notes that she first noticed these symptoms after waking from a nap on Friday 04/28/18. She notes that medical records described left arm symptoms, but she notes this is INCORRECT, and symptoms were localized to her RIGHT arm. She notes that her mouth and neck feel "swollen when she goes to open her mouth." She noticed her right breast being swollen yesterday. She also describes a sense of shortness of breath, which has been mildly increasing over the last several weeks. She notes that she has had a difficult time having her inhaler approved by her  insurance, and has not been able to use this for the last several weeks.   The pt denies redness of her right breast.  Lab results today (05/02/18) of CBC w/diff and CMP is as follows: all values are WNL except for RBC at 3.21, HGB at 9.4, HCT at 29.4, Lymphs abs at 600, Glucose at 325, Calcium at 8.8, Albumin at 2.7, AST at 12, Total Bilirubin at 0.2.  On review of systems, pt reports right arm swelling, right breast swelling, some sense of SOB, and denies leg swelling, breast redness, and any other symptoms.   MEDICAL HISTORY:  Past Medical History:  Diagnosis Date  . Arthritis   . Asthma   . Diabetes mellitus    Type II  . Hemorrhoids   . Hypercholesteremia   . Hypertension   . Hypothyroidism   . Metastatic lung cancer (metastasis from lung to other site) (HCC)    Lung, Mets to skin- right flank. CT angio 2019 suspicious for axillary, external iliac, right inguinal mets  . 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 NICM (nonischemic cardiomyopathy) ([place])    a. mild - EF 45-50% by echo 10/2016 but EF 52% by low risk nuc 11/2016. b. Echo 07/2017 - EF normal, mild AI, unremarkable echo otherwise.  . Obese   . Pneumonia 2017  . Sinus tachycardia    a. dates back to at least 2013, etiology unclear.    SURGICAL HISTORY: Past Surgical History:  Procedure Laterality Date  . CESAREAN SECTION    . COLONOSCOPY    . I/D  arm  Right 2013  . IR FLUORO GUIDE PORT INSERTION RIGHT  02/18/2017  . IR GENERIC HISTORICAL  01/08/2016   IR US GUIDE VASC ACCESS RIGHT 01/08/2016 [name], DO [facility]  . IR GENERIC HISTORICAL  01/08/2016   IR FLUORO GUIDE CV LINE RIGHT 01/08/2016 [name], DO [facility]  . IR US GUIDE VASC ACCESS RIGHT  02/18/2017  . MULTIPLE EXTRACTIONS WITH ALVEOLOPLASTY N/A 03/25/2017   Procedure: MULTIPLE EXTRACTION WITH ALVEOLOPLASTY (teeth #s four, six, seven, eight, nine, ten, eleven, one, twenty-three, twenty-four, twenty-five, twenty-six,  twenty-nine, thirty);  Surgeon: [name], [name];  Location: [facility];  Service: Oral Surgery;  Laterality: N/A;  . TUBAL LIGATION    . US guided core needle biopsy     of right lower neck/supraclavicular lymph nodes    SOCIAL HISTORY: Social History   Socioeconomic History  . Marital status: Divorced    Spouse name: Not on file  . Number of children: Not on file  . Years of education: Not on file  . Highest education level: Not on file  Occupational History  . Not on file  Social Needs  . Financial resource strain: Not on file  . Food insecurity:    Worry: Not on file    Inability: Not on file  . Transportation needs:    Medical: No    Non-medical: No  Tobacco Use  . Smoking status: Never Smoker  . Smokeless tobacco: Never Used  Substance and Sexual Activity  . Alcohol use: No  . Drug use: No  . Sexual activity: Not Currently  Lifestyle  . Physical activity:    Days per week: Not on file    Minutes per session: Not on file  . Stress: Not on file  Relationships  . Social connections:    Talks on phone: Not on file    Gets together: Not on file    Attends religious service: Not on file    Active member of club or organization: Not on file    Attends meetings of clubs or organizations: Not on file    Relationship status: Not on file  . Intimate partner violence:    Fear of current or ex partner: No    Emotionally abused: No    Physically abused: No    Forced sexual activity: No  Other Topics Concern  . Not on file  Social History Narrative   No bird or mold exposure. No recent travel.   11-23-17 Unable to ask abuse questions mother with her today.    FAMILY HISTORY: Family History  Problem Relation Age of Onset  . Heart failure Mother   . Hypertension Mother   . Cancer Neg Hx   . Rheumatologic disease Neg Hx     ALLERGIES:  has No Known Allergies.  MEDICATIONS:  Current Outpatient Medications  Medication Sig Dispense Refill  . ASPIRIN LOW DOSE 81 MG  EC tablet TAKE 1 TABLET BY MOUTH EVERY DAY (Patient taking differently: Take 81 mg by mouth daily. Enteric Coated.  ** DO NOT CRUSH **) 60 tablet 2  . atorvastatin (LIPITOR) 40 MG tablet Take 1 tablet (40 mg total) by mouth daily at 6 PM. 30 tablet 0  . DULoxetine (CYMBALTA) 60 MG capsule TAKE 1 CAPSULE BY MOUTH EVERY DAY (Patient taking differently: Take 60 mg by mouth daily. ) 30 capsule 2  . fluconazole (DIFLUCAN) 200 MG tablet Take 1 tablet (200 mg total) by mouth daily. (Patient not taking: Reported on 04/28/2018) 1 tablet  0  . hydrOXYzine (ATARAX/VISTARIL) 10 MG tablet Take 1 tablet (10 mg total) by mouth 3 (three) times daily as needed for itching. 30 tablet 0  . insulin aspart (NOVOLOG) 100 UNIT/ML FlexPen As per sliding scale instructions (Patient taking differently: Inject 0-10 Units into the skin 3 (three) times daily. As per sliding scale instructions) 15 mL 2  . Insulin Degludec (TRESIBA FLEXTOUCH) 200 UNIT/ML SOPN Inject 100 Units into the skin daily before breakfast.     . levalbuterol (XOPENEX HFA) 45 MCG/ACT inhaler Inhale 1-2 puffs into the lungs every 8 (eight) hours as needed for wheezing. 1 Inhaler 12  . levothyroxine (SYNTHROID, LEVOTHROID) 150 MCG tablet TAKE 1 TABLET BY MOUTH EVERY DAY BEFORE BREAKFAST (Patient taking differently: Take 150 mcg by mouth daily before breakfast. ) 30 tablet 2  . lidocaine-prilocaine (EMLA) cream Apply 1 application topically as needed. 30 g 6  . metoprolol succinate (TOPROL-XL) 50 MG 24 hr tablet Take 1 tablet (47m) by mouth in the morning and 1/2 tablet (24m in the evening. Take with or immediately following a meal. (Patient taking differently: Take 25-50 mg by mouth See admin instructions. Take 1 tablet (5046mby mouth in the morning and 1/2 tablet (61m64mn the evening. Take with or immediately following a meal.) 135 tablet 3  . naproxen (NAPROSYN) 500 MG tablet Take 1 tablet (500 mg total) by mouth 2 (two) times daily with a meal. (Patient not  taking: Reported on 04/28/2018) 20 tablet 0  . nitrofurantoin, macrocrystal-monohydrate, (MACROBID) 100 MG capsule Take 1 capsule (100 mg total) by mouth every 12 (twelve) hours. 8 capsule 0  . oxyCODONE-acetaminophen (PERCOCET) 5-325 MG tablet Take 1 tablet by mouth every 6 (six) hours as needed for moderate pain or severe pain. 50 tablet 0  . pregabalin (LYRICA) 50 MG capsule Take 50 mg by mouth 3 (three) times daily.    . seMarland Kitchenna-docusate (SENNA S) 8.6-50 MG tablet Take 2 tablets by mouth 2 (two) times daily. May reduce to 2 tab po HS as needed once regular BM estabished (Patient taking differently: Take 2 tablets by mouth 2 (two) times daily as needed (FOR CONSTIPATION). =) 60 tablet 1  . traZODone (DESYREL) 50 MG tablet Take 1-2 tablets (50-100 mg total) by mouth at bedtime as needed for sleep. (Patient not taking: Reported on 04/28/2018) 60 tablet 0  . Vitamins A & D (VITAMIN A & D) OINT Apply 1 application topically 2 (two) times daily. 1 Tube 3   No current facility-administered medications for this visit.    Facility-Administered Medications Ordered in Other Visits  Medication Dose Route Frequency Provider Last Rate Last Dose  . sodium chloride 0.9 % injection 10 mL  10 mL Intravenous PRN KaleBrunetta Genera   10 mL at 05/24/16 1252    REVIEW OF SYSTEMS:    A 10+ POINT REVIEW OF SYSTEMS WAS OBTAINED including neurology, dermatology, psychiatry, cardiac, respiratory, lymph, extremities, GI, GU, Musculoskeletal, constitutional, breasts, reproductive, HEENT.  All pertinent positives are noted in the HPI.  All others are negative.   PHYSICAL EXAMINATION: ECOG PERFORMANCE STATUS: 1 - Symptomatic but completely ambulatory  Vitals:   05/02/18 0816  BP: 121/84  Pulse: (!) 112  Resp: 18  Temp: 97.9 F (36.6 C)  SpO2: 94%   Filed Weights   05/02/18 0816  Weight: 282 lb 8 oz (128.1 kg)   .Body mass index is 41.72 kg/m.  GENERAL:alert, in no acute distress and comfortable SKIN:  no acute rashes, no  significant lesions EYES: conjunctiva are pink and non-injected, sclera anicteric OROPHARYNX: MMM, no exudates, no oropharyngeal erythema or ulceration NECK: supple, no JVD LYMPH:  no palpable lymphadenopathy in the cervical, axillary or inguinal regions LUNGS: clear to auscultation b/l with normal respiratory effort HEART: regular rate & rhythm ABDOMEN:  normoactive bowel sounds , non tender, not distended. No palpable hepatosplenomegaly.  BREAST: minimal increase of swelling of right breast with mild tenderness to palpation without redness, erythema or obvious induration (exam performed with scribe and nurse chaperones) Extremity: no pedal edema PSYCH: alert & oriented x 3 with fluent speech NEURO: no focal motor/sensory deficits   LABORATORY DATA: .  Marilyn [name] CBC Latest Ref Rng & Units 05/02/2018 04/28/2018 04/17/2018  WBC 4.0 - 10.5 K/uL 6.3 5.9 5.4  Hemoglobin 12.0 - 15.0 g/dL 9.4(L) 9.1(L) 9.6(L)  Hematocrit 36.0 - 46.0 % 29.4(L) 29.7(L) 29.5(L)  Platelets 150 - 400 K/uL 264 246 239    . CMP Latest Ref Rng & Units 05/02/2018 04/30/2018 04/28/2018  Glucose 70 - 99 mg/dL 325(H) 125(H) 298(H)  BUN 6 - 20 mg/dL '11 7 11  ' Creatinine 0.44 - 1.00 mg/dL 0.84 0.64 0.62  Sodium 135 - 145 mmol/L 139 139 138  Potassium 3.5 - 5.1 mmol/L 3.8 3.9 3.4(L)  Chloride 98 - 111 mmol/L 104 104 105  CO2 22 - 32 mmol/L '25 28 27  ' Calcium 8.9 - 10.3 mg/dL 8.8(L) 8.8(L) 8.2(L)  Total Protein 6.5 - 8.1 g/dL 7.3 - 7.0  Total Bilirubin 0.3 - 1.2 mg/dL 0.2(L) - <0.1(L)  Alkaline Phos 38 - 126 U/L 102 - 77  AST 15 - 41 U/L 12(L) - 15  ALT 0 - 44 U/L 9 - 11               08/18/17 Bx:   08/16/17 Molecular Pathology:    RADIOGRAPHIC STUDIES:  .Dg Chest 2 View  Result Date: 04/03/2018 CLINICAL DATA:  Increasing shortness of breath over the past week. Metastatic lung cancer. EXAM: CHEST - 2 VIEW COMPARISON:  07/29/2017 and chest CT dated 03/01/2018. FINDINGS: Normal sized heart.  Stable post radiation changes and scarring on the right. Interval mild left lower lobe atelectasis. Right jugular porta catheter tip in the proximal superior vena cava. Mild scoliosis. Mild thoracic spine degenerative changes. IMPRESSION: Interval mild left lower lobe atelectasis. Electronically Signed   By: Claudie Revering M.D.   On: 04/03/2018 14:43   Ct Angio Chest Pe W Or Wo Contrast  Result Date: 04/03/2018 CLINICAL DATA:  Chest pain and shortness of breath. Lung carcinoma receiving chemotherapy EXAM: CT ANGIOGRAPHY CHEST WITH CONTRAST TECHNIQUE: Multidetector CT imaging of the chest was performed using the standard protocol during bolus administration of intravenous contrast. Multiplanar CT image reconstructions and MIPs were obtained to evaluate the vascular anatomy. CONTRAST:  159m ISOVUE-370 IOPAMIDOL (ISOVUE-370) INJECTION 76% COMPARISON:  Chest CT March 01, 2018; chest radiograph April 03, 2018 FINDINGS: Cardiovascular: There is no demonstrable pulmonary embolus. There is no thoracic aortic aneurysm or dissection. There is no pericardial effusion or pericardial thickening. Port-A-Cath tip is at the cavoatrial junction. Mediastinum/Nodes: Limited visualization of thyroid. There is extensive right-sided subpectoral and axillary adenopathy, also present on prior study. The largest lymph node in this area is in the right axillary region measuring 5.4 x 3.2 cm, unchanged from most recent study. No progression of adenopathy in this area is evident. There is adenopathy in the right para carinal region. A lymph node anterior to the carina on the right measures 2.2  x 1.7 cm, stable. There are prominent subcarinal lymph nodes, largest measuring 1.3 x 1.2 cm. No new adenopathy is evident. No esophageal lesions are appreciable. There are several conglomerated lymph nodes in this area vitamin this shunt Lungs/Pleura: There is a right pleural effusion, larger than on recent study. There is probable post  radiation therapy change in the right perihilar regions with areas of retraction and localized bronchiectasis. There is also consolidation and bronchiectatic change in the right lower lobe, stable. This appearance is similar to most recent study. No new parenchymal lung opacity evident. Upper Abdomen: Visualized upper abdominal structures appear unremarkable. Musculoskeletal: No blastic or lytic bone lesions are evident. There are no appreciable chest wall lesions. Review of the MIP images confirms the above findings. IMPRESSION: 1. No demonstrable pulmonary embolus. No thoracic aortic aneurysm or dissection. 2. Stable extensive adenopathy, most marked in the right subpectoral and axillary regions. No new adenopathy. 3. Consolidation right perihilar and right lower lobe regions. Probable post radiation therapy changes right perihilar region. Lower lobe bronchiectatic change. No new parenchymal lung opacity. There is a right pleural effusion which is slightly larger than 1 month prior. Electronically Signed   By: Lowella Grip III M.D.   On: 04/03/2018 16:56   Mr Jeri Cos WN Contrast  Result Date: 04/29/2018 CLINICAL DATA:  Left arm weakness, right facial numbness. Metastatic lung cancer. EXAM: MRI HEAD WITHOUT AND WITH CONTRAST MRA HEAD WITHOUT CONTRAST TECHNIQUE: Multiplanar, multiecho pulse sequences of the brain and surrounding structures were obtained without and with intravenous contrast. Angiographic images of the head were obtained using MRA technique without contrast. CONTRAST:  10 mL Gadavist COMPARISON:  None. FINDINGS: MRI HEAD FINDINGS BRAIN: There is no acute infarct, acute hemorrhage or mass effect. The midline structures are normal. There are no old infarcts. The white matter signal is normal for the patient's age. The CSF spaces are normal for age, with no hydrocephalus. Susceptibility-sensitive sequences show no chronic microhemorrhage or superficial siderosis. SKULL AND UPPER CERVICAL SPINE:  The visualized skull base, calvarium, upper cervical spine and extracranial soft tissues are normal. SINUSES/ORBITS: No fluid levels or advanced mucosal thickening. No mastoid or middle ear effusion. The orbits are normal. MRA HEAD FINDINGS POSTERIOR CIRCULATION: --Basilar artery: Normal. --Posterior cerebral arteries: Normal.  Bilateral fetal origins. --Superior cerebellar arteries: Normal. --Inferior cerebellar arteries: Normal posterior inferior cerebellar arteries. Anterior inferior cerebellar arteries are not clearly visible, but this is not uncommon. ANTERIOR CIRCULATION: --Intracranial internal carotid arteries: Normal. --Anterior cerebral arteries: Normal. Both A1 segments are present. Patent anterior communicating artery. --Middle cerebral arteries: Normal. --Posterior communicating arteries: Present bilaterally. IMPRESSION: 1. Normal MRI of the brain. 2. Normal intracranial MRA. Electronically Signed   By: Ulyses Jarred M.D.   On: 04/29/2018 06:03   Mr Cervical Spine W Wo Contrast  Result Date: 04/29/2018 CLINICAL DATA:  Left arm weakness and numbness. Metastatic lung cancer. EXAM: MRI CERVICAL SPINE WITHOUT AND WITH CONTRAST TECHNIQUE: Multiplanar and multiecho pulse sequences of the cervical spine, to include the craniocervical junction and cervicothoracic junction, were obtained without and with intravenous contrast. CONTRAST:  10 mL Gadavist COMPARISON:  None. FINDINGS: Alignment: Normal. Vertebrae: No focal marrow lesion. No compression fracture or evidence of discitis osteomyelitis. Cord: Normal caliber and signal. Posterior Fossa, vertebral arteries, paraspinal tissues: Visualized posterior fossa is normal. Vertebral artery flow voids are preserved. No prevertebral effusion. Disc levels: Sagittal imaging includes the atlantoaxial joint to the level of the T3-4 disc space, with axial imaging of the disc spaces  from C2-3 to C7-T1. Atlantoaxial articulation is normal. C2-3: Normal. C3-4: Normal.  C4-5: Normal. C5-6: Small central disc protrusion with mild spinal canal stenosis. No neural foraminal stenosis. C6-7: Left subarticular disc protrusion causes mild spinal canal stenosis and moderate left neural foraminal stenosis. Right neural foramen is patent. C7-T1: Normal. Sagittal imaging of the T1-T4 levels is normal. IMPRESSION: 1. No cervical metastatic disease. 2. 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 3. Unchanged moderate left neural foraminal stenosis at C6-7. Electronically Signed   By: Ulyses Jarred M.D.   On: 04/29/2018 06:09   Mr Jodene Nam Head Wo Contrast  Result Date: 04/29/2018 CLINICAL DATA:  Left arm weakness, right facial numbness. Metastatic lung cancer. EXAM: MRI HEAD WITHOUT AND WITH CONTRAST MRA HEAD WITHOUT CONTRAST TECHNIQUE: Multiplanar, multiecho pulse sequences of the brain and surrounding structures were obtained without and with intravenous contrast. Angiographic images of the head were obtained using MRA technique without contrast. CONTRAST:  10 mL Gadavist COMPARISON:  None. FINDINGS: MRI HEAD FINDINGS BRAIN: There is no acute infarct, acute hemorrhage or mass effect. The midline structures are normal. There are no old infarcts. The white matter signal is normal for the patient's age. The CSF spaces are normal for age, with no hydrocephalus. Susceptibility-sensitive sequences show no chronic microhemorrhage or superficial siderosis. SKULL AND UPPER CERVICAL SPINE: The visualized skull base, calvarium, upper cervical spine and extracranial soft tissues are normal. SINUSES/ORBITS: No fluid levels or advanced mucosal thickening. No mastoid or middle ear effusion. The orbits are normal. MRA HEAD FINDINGS POSTERIOR CIRCULATION: --Basilar artery: Normal. --Posterior cerebral arteries: Normal.  Bilateral fetal origins. --Superior cerebellar arteries: Normal. --Inferior cerebellar arteries: Normal posterior inferior cerebellar arteries. Anterior inferior cerebellar arteries  are not clearly visible, but this is not uncommon. ANTERIOR CIRCULATION: --Intracranial internal carotid arteries: Normal. --Anterior cerebral arteries: Normal. Both A1 segments are present. Patent anterior communicating artery. --Middle cerebral arteries: Normal. --Posterior communicating arteries: Present bilaterally. IMPRESSION: 1. Normal MRI of the brain. 2. Normal intracranial MRA. Electronically Signed   By: Ulyses Jarred M.D.   On: 04/29/2018 06:03   Ct Head Code Stroke Wo Contrast  Result Date: 04/28/2018 CLINICAL DATA:  Code stroke. Initial evaluation for acute right-sided numbness and weakness. EXAM: CT HEAD WITHOUT CONTRAST TECHNIQUE: Contiguous axial images were obtained from the base of the skull through the vertex without intravenous contrast. COMPARISON:  Prior MRI from 11/19/2016 FINDINGS: Brain: Cerebral volume within normal limits for patient age. No evidence for acute intracranial hemorrhage. No findings to suggest acute large vessel territory infarct. No mass lesion, midline shift, or mass effect. Ventricles are normal in size without evidence for hydrocephalus. No extra-axial fluid collection identified. Vascular: No hyperdense vessel identified. Skull: Scalp soft tissues demonstrate no acute abnormality. Calvarium intact. Sinuses/Orbits: Globes and orbital soft tissues within normal limits. Visualized paranasal sinuses are clear. No mastoid effusion. ASPECTS [facility] Stroke Program Early CT Score) - Ganglionic level infarction (caudate, lentiform nuclei, internal capsule, insula, M1-M3 cortex): 7 - Supraganglionic infarction (M4-M6 cortex): 3 Total score (0-10 with 10 being normal): 10 IMPRESSION: 1. Negative head CT.  No acute intracranial abnormality identified 2. ASPECTS is 10. Critical Value/emergent results were called by telephone at the time of interpretation on 04/28/2018 at 10:38 pm to Dr. Julianne Rice , who verbally acknowledged these results. Electronically Signed   By:  Jeannine Boga M.D.   On: 04/28/2018 22:40   Vas US Carotid (at [place] Only)  Result Date: 04/29/2018 Carotid Arterial Duplex Study  Indications:  CVA. Risk Factors: Hypertension, hyperlipidemia, Diabetes. Performing Technologist: Maudry Mayhew MHA, RDMS, RVT, RDCS  Examination Guidelines: A complete evaluation includes B-mode imaging, spectral Doppler, color Doppler, and power Doppler as needed of all accessible portions of each vessel. Bilateral testing is considered an integral part of a complete examination. Limited examinations for reoccurring indications may be performed as noted.  Right Carotid Findings: +----------+--------+--------+--------+-----------------------+--------+           PSV cm/sEDV cm/sStenosisDescribe               Comments +----------+--------+--------+--------+-----------------------+--------+ CCA Prox  73      16                                              +----------+--------+--------+--------+-----------------------+--------+ CCA Distal58      24              smooth and heterogenous         +----------+--------+--------+--------+-----------------------+--------+ ICA Prox  58      23              smooth and heterogenous         +----------+--------+--------+--------+-----------------------+--------+ ICA Distal63      23                                              +----------+--------+--------+--------+-----------------------+--------+ ECA       71      12                                              +----------+--------+--------+--------+-----------------------+--------+ +----------+--------+-------+--------------+-------------------+           PSV cm/sEDV cmsDescribe      Arm Pressure (mmHG) +----------+--------+-------+--------------+-------------------+ Subclavian               Not identified                    +----------+--------+-------+--------------+-------------------+  +---------+--------+--------+-------------------+ VertebralPSV cm/sEDV cm/sUnable to visualize +---------+--------+--------+-------------------+ Anechoic area in the right lateral neck (area of pain) measuring 2.1cm. Suggestive of possible lymph node versus unknown etiology.  Left Carotid Findings: +----------+--------+-------+--------+----------------------+------------------+           PSV cm/sEDV    StenosisDescribe              Comments                             cm/s                                                    +----------+--------+-------+--------+----------------------+------------------+ CCA Prox  119     31                                                      +----------+--------+-------+--------+----------------------+------------------+  CCA Distal69      22                                   intimal thickening +----------+--------+-------+--------+----------------------+------------------+ ICA Prox  121     39             smooth and                                                                heterogenous                             +----------+--------+-------+--------+----------------------+------------------+ ICA Distal49      21                                                      +----------+--------+-------+--------+----------------------+------------------+ ECA       91      19                                                      +----------+--------+-------+--------+----------------------+------------------+ +----------+--------+--------+----------------+-------------------+ SubclavianPSV cm/sEDV cm/sDescribe        Arm Pressure (mmHG) +----------+--------+--------+----------------+-------------------+           55              Multiphasic, WNL                    +----------+--------+--------+----------------+-------------------+ +---------+--------+--+--------+--+---------+ VertebralPSV cm/s31EDV  cm/s11Antegrade +---------+--------+--+--------+--+---------+ There are multiple hypoechoic heterogenous areas in the left clavicular area, suggestive of possible lymph node versus unknown etiology.  Summary: Right Carotid: Velocities in the right ICA are consistent with a 1-39% stenosis. Left Carotid: Velocities in the left ICA are consistent with a 1-39% stenosis. Vertebrals:  Left vertebral artery demonstrates antegrade flow. Right vertebral              artery was not visualized. Subclavians: Right subclavian artery was not visualized. Normal flow              hemodynamics were seen in the left subclavian artery. *See table(s) above for measurements and observations.  Electronically signed by Servando Snare MD on 04/29/2018 at 1:30:01 PM.    Final      ASSESSMENT & PLAN:   53 y.o.  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  -Noted to have a small mass in the left adrenal on CT but PET/CT neg for metastatic disease. Patient has been a lifelong nonsmoker -MRI of the brain was negative for any metastatic disease. At diagnosis. -High PDL1 expression (90%) on foundation One Neg for EGFR,  ALK, ROS-1 and BRAF mutations. -Patient completed her planned definitive chemoradiation with Carbo Taxol on 02/13/2016. No prohibitive toxicities other than some grade 1 skin desquamation and some grade 1-2 radiation esophagitis. She has subsequently received 2 out of 2 planned dose of carboplatin + Taxol. -CTA chest 10/29/2016  no evidence of lung cancer progression in the chest. No PE -CT abd/pelvis-10/29/2016  Interval 5.0 x 5.0 x 4.7 cm mass in the right lateral subcutaneous fat at the level of  the mid abdomen. This has CT features compatible with a metastasis or primary neoplasm. -CT C/A/P (05/12/2017): Interval development of right axillary, right external iliac and right inguinal adenopathy. Suspicious for nodal metastasis. 2. Decrease in size of right paratracheal adenopathy. 3. Decrease in size and enhancement associated with right lateral body wall lesion. 4. New small nonspecific pulmonary nodule in the left lower lobe measuring 6 mm. 5. Stable appearance of changes secondary to external beam radiation within the right lung  08/03/17 PET which revealed 2.8 cm right axillary lymph node is markedly hypermetabolic and consistent with metastatic disease. Extensive radiation changes involving the right paramediastinal lung. There is a focus of hypermetabolism in the right lower lobe which is suspicious for residual or recurrent tumor.  11/11/17 CT C/A/P revealed There has been interval increase in number of enlarged of right axillary nodes and increase in size of right inguinal adenopathy compatible with progression of disease. Stable appearance of the right lung with changes of external beam radiation.    03/01/18 CT C/A/P revealed Bulky right subpectoral/axillary adenopathy, markedly progressive from 11/11/2017. Enlarging retrocrural lymph node. Low right paratracheal adenopathy is stable. 2. Interval resection of a right inguinal nodal mass. Stable right external iliac lymph node. 3. Cholelithiasis.  Stable common bile duct dilatation. 4.  Aortic atherosclerosis.  she received maintenance Durvalumab from  05/24/2016 q2weeks, switched to Nivolumab q2weeks on 12/13/16 in the setting of Biopsy proven isolated metastatic disease. Given the tumor is strongly PDL1 positive and patient has only isolated metastatic disease was treated with palliative RT to metastases with significant improvement. Molecular pathology from 515/19 did not reveal obviously targetable mutations with NF1 and TP53 mutations  present.  Plan:  -Discussed pt labwork today, 05/02/18; blood counts are stable, Glucose at 325, advised that pt take her insulin and check her blood sugars appropriately  -Discussed the radiographic evaluations from 04/29/18 hospital admission for work up of RIGHT sided weakness and numbness. No evidence of brain metastasis or new bone involvement; symptoms attributed to stable cervical spinal stenosis -Discussed my concerns for her right arm and breast swelling as: Radiation related inflammation vs lymphedema and tumor growth vs blood clot -Will hold her every-2 week Nivolumab treatment scheduled for today while working up these symptoms -Will order US Venous of RIGHT Upper Extremity - neg for DVT -referred to cancer rehab for lymphedema mx -Will order CT Chest to further evaluate lymph nodes and possible involvement -Provided number to social work to [employer] questions, which is preventing her from having her inhaler at this time  -Recommend pt keep her arm elevated and stressed the importance of PT as well  -Vitamin A&D ointment and Hydorxyzine for vaginal itching  -Advised that pt continue follow up with her cardiologist -Advised patient to follow up with her PCP regarding her Lyrica dose. -Will see the pt back in 2 weeks  #3 h/o radiation pneumonitis - no new symptoms. Does has some cough with exertion but this has improved.  #4 Improving fatigue (grade 1) -  improved fatigue with levothyroxine compliance with near normalization of FT4 levels.  - previously given TSH of 12 -- increased Levolthyroxine from 158mg to 1573m  ---will likely need larger dose but in the setting of baseline tachycardia we are gradually increasing dose to avoid ppt cardiac arrhythmias.  -TSH 11/14/2017 WNL.  #5 normocytic anemia related to chemotherapy -resolving and stable  #6 Left chest wall pain due to costochondritis - mx with pain medications. CTA x 3 neg for PE. No significant pain at this  time.  #7 Sinus tachycardia - on BB per cards , no PE on CTA x 3 , -Still having sinus tachycardia . Partly from deconditioning and anemia. Seeing Dr. HiDebara Pickettrom cardiology  -Continues to be on BB  #8 neoplasm related pain is currently controlled without significant medications. Clinically likely has sleep apnea . Would need to be careful with sedative medications . 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 The patient knows to call the clinic with any problems, questions or concerns.  The total time spent in the appt was 25 minutes and more than 50% was on counseling and direct patient cares.    GautSullivan LoneMS ADillerIVMS SCH [facility]Oncology Physician [facility]):       [phone] cell):  [phone]):           [phone] SchuBaldwin [place] acting as a scribe for Dr. GautSullivan Lone.I have reviewed the above documentation for accuracy and completeness, and I agree with the above. .GauBrunetta Genera

## 2018-05-02 ENCOUNTER — Ambulatory Visit
Admission: RE | Admit: 2018-05-02 | Discharge: 2018-05-02 | Disposition: A | Discharge status: Home / Self Care | Payer: Self-pay | Source: Ambulatory Visit | Attending: Urology | Admitting: Urology

## 2018-05-02 ENCOUNTER — Inpatient Hospital Stay: Payer: Medicaid Other

## 2018-05-02 ENCOUNTER — Inpatient Hospital Stay (HOSPITAL_BASED_OUTPATIENT_CLINIC_OR_DEPARTMENT_OTHER): Payer: Medicaid Other | Admitting: Hematology

## 2018-05-02 ENCOUNTER — Other Ambulatory Visit: Payer: Self-pay

## 2018-05-02 ENCOUNTER — Encounter: Payer: Self-pay | Admitting: Urology

## 2018-05-02 VITALS — BP 118/92 | HR 115 | Temp 97.8°F | Resp 20 | Ht 69.0 in | Wt 278.2 lb

## 2018-05-02 VITALS — BP 121/84 | HR 112 | Temp 97.9°F | Resp 18 | Ht 69.0 in | Wt 282.5 lb

## 2018-05-02 DIAGNOSIS — M129 Arthropathy, unspecified: Secondary | ICD-10-CM | POA: Diagnosis not present

## 2018-05-02 DIAGNOSIS — Z5112 Encounter for antineoplastic immunotherapy: Secondary | ICD-10-CM | POA: Diagnosis not present

## 2018-05-02 DIAGNOSIS — C3491 Malignant neoplasm of unspecified part of right bronchus or lung: Secondary | ICD-10-CM

## 2018-05-02 DIAGNOSIS — R000 Tachycardia, unspecified: Secondary | ICD-10-CM

## 2018-05-02 DIAGNOSIS — Z95828 Presence of other vascular implants and grafts: Secondary | ICD-10-CM

## 2018-05-02 DIAGNOSIS — E119 Type 2 diabetes mellitus without complications: Secondary | ICD-10-CM

## 2018-05-02 DIAGNOSIS — J45909 Unspecified asthma, uncomplicated: Secondary | ICD-10-CM | POA: Diagnosis not present

## 2018-05-02 DIAGNOSIS — G4700 Insomnia, unspecified: Secondary | ICD-10-CM

## 2018-05-02 DIAGNOSIS — Z923 Personal history of irradiation: Secondary | ICD-10-CM

## 2018-05-02 DIAGNOSIS — R531 Weakness: Secondary | ICD-10-CM

## 2018-05-02 DIAGNOSIS — C3490 Malignant neoplasm of unspecified part of unspecified bronchus or lung: Secondary | ICD-10-CM

## 2018-05-02 DIAGNOSIS — Z7189 Other specified counseling: Secondary | ICD-10-CM

## 2018-05-02 DIAGNOSIS — R0602 Shortness of breath: Secondary | ICD-10-CM

## 2018-05-02 DIAGNOSIS — I10 Essential (primary) hypertension: Secondary | ICD-10-CM

## 2018-05-02 DIAGNOSIS — Z452 Encounter for adjustment and management of vascular access device: Secondary | ICD-10-CM

## 2018-05-02 DIAGNOSIS — G629 Polyneuropathy, unspecified: Secondary | ICD-10-CM

## 2018-05-02 DIAGNOSIS — E7800 Pure hypercholesterolemia, unspecified: Secondary | ICD-10-CM

## 2018-05-02 DIAGNOSIS — E039 Hypothyroidism, unspecified: Secondary | ICD-10-CM

## 2018-05-02 DIAGNOSIS — G893 Neoplasm related pain (acute) (chronic): Secondary | ICD-10-CM

## 2018-05-02 DIAGNOSIS — Z9221 Personal history of antineoplastic chemotherapy: Secondary | ICD-10-CM

## 2018-05-02 DIAGNOSIS — R5383 Other fatigue: Secondary | ICD-10-CM

## 2018-05-02 LAB — CBC WITH DIFFERENTIAL/PLATELET
Abs Immature Granulocytes: 0.02 10*3/uL (ref 0.00–0.07)
BASOS ABS: 0 10*3/uL (ref 0.0–0.1)
Basophils Relative: 0 %
Eosinophils Absolute: 0.3 10*3/uL (ref 0.0–0.5)
Eosinophils Relative: 5 %
HCT: 29.4 % — ABNORMAL LOW (ref 36.0–46.0)
Hemoglobin: 9.4 g/dL — ABNORMAL LOW (ref 12.0–15.0)
Immature Granulocytes: 0 %
LYMPHS ABS: 0.6 10*3/uL — AB (ref 0.7–4.0)
Lymphocytes Relative: 9 %
MCH: 29.3 pg (ref 26.0–34.0)
MCHC: 32 g/dL (ref 30.0–36.0)
MCV: 91.6 fL (ref 80.0–100.0)
Monocytes Absolute: 0.3 10*3/uL (ref 0.1–1.0)
Monocytes Relative: 5 %
NEUTROS PCT: 81 %
NRBC: 0 % (ref 0.0–0.2)
Neutro Abs: 5.1 10*3/uL (ref 1.7–7.7)
Platelets: 264 10*3/uL (ref 150–400)
RBC: 3.21 MIL/uL — ABNORMAL LOW (ref 3.87–5.11)
RDW: 12.6 % (ref 11.5–15.5)
WBC: 6.3 10*3/uL (ref 4.0–10.5)

## 2018-05-02 LAB — CMP (CANCER CENTER ONLY)
ALT: 9 U/L (ref 0–44)
AST: 12 U/L — AB (ref 15–41)
Albumin: 2.7 g/dL — ABNORMAL LOW (ref 3.5–5.0)
Alkaline Phosphatase: 102 U/L (ref 38–126)
Anion gap: 10 (ref 5–15)
BUN: 11 mg/dL (ref 6–20)
CO2: 25 mmol/L (ref 22–32)
Calcium: 8.8 mg/dL — ABNORMAL LOW (ref 8.9–10.3)
Chloride: 104 mmol/L (ref 98–111)
Creatinine: 0.84 mg/dL (ref 0.44–1.00)
GFR, Estimated: >60 mL/min (ref >60)
Glucose, Bld: 325 mg/dL — ABNORMAL HIGH (ref 70–99)
Potassium: 3.8 mmol/L (ref 3.5–5.1)
Sodium: 139 mmol/L (ref 135–145)
TOTAL PROTEIN: 7.3 g/dL (ref 6.5–8.1)
Total Bilirubin: 0.2 mg/dL — ABNORMAL LOW (ref 0.3–1.2)

## 2018-05-02 MED ORDER — SODIUM CHLORIDE 0.9% FLUSH
10.0000 mL | INTRAVENOUS | Status: DC | PRN
  Administered 2018-05-02: 10 mL via INTRAVENOUS
  Filled 2018-05-02: qty 10

## 2018-05-02 MED ORDER — HEPARIN SOD (PORK) LOCK FLUSH 100 UNIT/ML IV SOLN
500.0000 [IU] | Freq: Once | INTRAVENOUS | Status: AC | PRN
  Administered 2018-05-02: 500 [IU] via INTRAVENOUS
  Filled 2018-05-02: qty 5

## 2018-05-02 NOTE — Patient Instructions (Signed)
Thank you for choosing [facility] to provide your oncology and hematology care.  To afford each patient quality time with our providers, please arrive 30 minutes before your scheduled appointment time.  If you arrive late for your appointment, you may be asked to reschedule.  We strive to give you quality time with our providers, and arriving late affects you and other patients whose appointments are after yours.    If you are a no show for multiple scheduled visits, you may be dismissed from the clinic at the providers discretion.     Again, thank you for choosing [facility], our hope is that these requests will decrease the amount of time that you wait before being seen by our physicians.  ______________________________________________________________________   Should you have questions after your visit to the [facility], please contact our office at [phone] between the hours of 8:30 and 4:30 p.m.    Voicemails left after 4:30p.m will not be returned until the following business day.     For prescription refill requests, please have your pharmacy contact us directly.  Please also try to allow 48 hours for prescription requests.     Please contact the scheduling department for questions regarding scheduling.  For scheduling of procedures such as PET scans, CT scans, MRI, Ultrasound, etc please contact central scheduling at [phone].     Resources For Cancer Patients and Caregivers:    Oncolink.org:  A wonderful resource for patients and healthcare providers for information regarding your disease, ways to tract your treatment, what to expect, etc.      American Cancer Society:  800-227-2345  Can help patients locate various types of support and financial assistance   Cancer Care: 1-800-813-HOPE (4673) Provides financial assistance, online support groups, medication/co-pay assistance.     [place] DSS:  336-641-3447 Where to apply  for food stamps, Medicaid, and utility assistance   Medicare Rights Center: 800-333-4114 Helps people with Medicare understand their rights and benefits, navigate the Medicare system, and secure the quality healthcare they deserve   SCAT: 336-333-6589 [place] [employer]'s shared-ride transportation service for eligible riders who have a disability that prevents them from riding the fixed route bus.     For additional information on assistance programs please contact our social worker:   Abigail Elmore:  [phone]  

## 2018-05-02 NOTE — Progress Notes (Signed)
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  12/07/16 - 12/20/16: The right flank was treated to 30 Gy in 10 fractions.  01/05/16 - 02/13/16: The right lung was treated to 60 Gy in 30 fractions, concurrent with chemotherapy.  Narrative:  The patient returns today for routine follow-up.  She reports that she tolerated her recent radiotherapy the the right axilla very well. She has noted significant improvement, almost complete resolution in the right axillary swelling.  Unfortunately, she recently developed some increased shortness of breath and chest pain which was evaluated with chest x-ray and chest CTA on 04/03/18 which were both negative for any acute abnormalities. She was also recently admitted to the hospital on 04/28/18 due to new onset right arm numbness and weakness and was worked up for potential stroke.  Fortunately, all scans were negative for acute CVA or metastatic disease but there was evidence of significant vertebral stenosis on MRI of the C-spine.  She has noted increased swelling in the right arm and right breast.  She denies erythema or increased warmth in the arm or breast but does complain of tightness/discomfort when the swelling progresses throughout the day.  In summary, she has a history of stage IIIB adenocarcinoma of the right lung and initially presented with with a large right paratracheal mass with mediastinal and  right supraclavicular adenopathy causing SVC syndrome in 12/2015.  MRI of the brain was negative at the time of diagnosis.  She was treated with concurrent chemoradiation with Carboplatin/Taxol- completed RT on 02/13/2016 and then completed 2 additional cycles of carboplatin + taxol.  She started maintainence Durvalumab on 05/24/2016 given q2weeks. She was switched to Nivolumab q2weeks on 12/13/16 due to metastatic disease. She presented with a painful, enlarging metastatic right flank mass in 10/2016 which was subsequently treated with palliative radiation therapy in 12/2016.  She tolerated her treatment well and has not had further right flank pain or palpable masses.  She has continued in routine follow up with Dr. Irene Limbo since that time and has remained on Nivolumab q 2 weeks which she tolerates well.  In July 2019, she presented with progressive pelvic pain and a repeat CT at that time demonstrated increased size of right inguinal adenopathy consistent with metastatic disease. She elected to proceed with palliative radiation the the enlarging right inguinal nodal mass in 12/2017 which she tolerated well.   She was having progressive pain in the right shoulder for several months and recently underwent chest/abdomen/pelvis CT scan on 03/01/18 which revealed bulky right subpectoral/axillary adenopathy, markedly progressive from 11/11/17, as well as an enlarging retrocrural lymph node. This was treated in 03/2018 with palliative radiotherapy to the right axilla which she tolerated well.  PAST MEDICAL HISTORY:  Past Medical History:  Diagnosis Date  . Arthritis   . Asthma   . Diabetes mellitus    Type II  . Hemorrhoids   . Hypercholesteremia   . Hypertension   .  Hypothyroidism   . Metastatic lung cancer (metastasis from lung to other site) (HCC)    Lung, Mets to skin- right flank. CT angio 2019 suspicious for axillary, external iliac, right inguinal mets  . 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 NICM (nonischemic cardiomyopathy) ([place])    a. mild - EF 45-50% by echo 10/2016 but EF 52% by low risk nuc 11/2016. b. Echo 07/2017 - EF normal, mild AI, unremarkable echo otherwise.  . Obese   . Pneumonia 2017  . Sinus tachycardia    a. dates back to at least 2013, etiology unclear.      PAST SURGICAL HISTORY: Past Surgical History:  Procedure Laterality Date  . CESAREAN SECTION    . COLONOSCOPY    . I/D  arm Right 2013  . IR FLUORO GUIDE PORT INSERTION RIGHT  02/18/2017  . IR GENERIC HISTORICAL  01/08/2016   IR US GUIDE VASC ACCESS RIGHT 01/08/2016 [name], DO [facility]  . IR GENERIC HISTORICAL  01/08/2016   IR FLUORO GUIDE CV LINE RIGHT 01/08/2016 [name], DO [facility]  . IR US GUIDE VASC ACCESS RIGHT  02/18/2017  . MULTIPLE EXTRACTIONS WITH ALVEOLOPLASTY N/A 03/25/2017   Procedure: MULTIPLE EXTRACTION WITH ALVEOLOPLASTY (teeth #s four, six, seven, eight, nine, ten, eleven, one, twenty-three, twenty-four, twenty-five, twenty-six, twenty-nine, thirty);  Surgeon: [name], [name];  Location: [facility];  Service: Oral Surgery;  Laterality: N/A;  . TUBAL LIGATION    . US guided core needle biopsy     of right lower neck/supraclavicular lymph nodes    FAMILY HISTORY:  Family History  Problem Relation Age of Onset  . Heart failure Mother   . Hypertension Mother   . Cancer Neg Hx   . Rheumatologic disease Neg Hx     SOCIAL HISTORY:  Social History   Socioeconomic History  . Marital status: Divorced    Spouse name: Not on file  . Number of children: Not on file  . Years of education: Not on file  . Highest education level: Not on file  Occupational History  . Not on file  Social Needs  . Financial resource strain: Not on file  . Food insecurity:    Worry: Not on file    Inability: Not on file  . Transportation needs:    Medical: No    Non-medical: No  Tobacco Use  . Smoking status: Never Smoker  . Smokeless tobacco: Never Used  Substance and  Sexual Activity  . Alcohol use: No  . Drug use: No  . Sexual activity: Not Currently  Lifestyle  . Physical activity:    Days per week: Not on file    Minutes per session: Not on file  . Stress: Not on file  Relationships  . Social connections:    Talks on phone: Not on file    Gets together: Not on file    Attends religious service: Not on file    Active member of club or organization: Not on file    Attends meetings of clubs or organizations: Not on file    Relationship status: Not on file  . Intimate partner violence:    Fear of current or ex partner: No    Emotionally abused: No    Physically abused: No    Forced sexual activity: No  Other Topics Concern  . Not on file  Social History Narrative   No bird or mold exposure. No recent travel.  11-23-17 Unable to ask abuse questions mother with her today.    ALLERGIES: Patient has no known allergies.  MEDICATIONS:  Current Outpatient Medications  Medication Sig Dispense Refill  . ASPIRIN LOW DOSE 81 MG EC tablet TAKE 1 TABLET BY MOUTH EVERY DAY (Patient taking differently: Take 81 mg by mouth daily. Enteric Coated.  ** DO NOT CRUSH **) 60 tablet 2  . atorvastatin (LIPITOR) 40 MG tablet Take 1 tablet (40 mg total) by mouth daily at 6 PM. 30 tablet 0  . DULoxetine (CYMBALTA) 60 MG capsule TAKE 1 CAPSULE BY MOUTH EVERY DAY (Patient taking differently: Take 60 mg by mouth daily. ) 30 capsule 2  . fluconazole (DIFLUCAN) 200 MG tablet Take 1 tablet (200 mg total) by mouth daily. (Patient not taking: Reported on 04/28/2018) 1 tablet 0  . hydrOXYzine (ATARAX/VISTARIL) 10 MG tablet Take 1 tablet (10 mg total) by mouth 3 (three) times daily as needed for itching. 30 tablet 0  . insulin aspart (NOVOLOG) 100 UNIT/ML FlexPen As per sliding scale instructions (Patient taking differently: Inject 0-10 Units into the skin 3 (three) times daily. As per sliding scale instructions) 15 mL 2  . Insulin Degludec (TRESIBA FLEXTOUCH) 200 UNIT/ML SOPN  Inject 100 Units into the skin daily before breakfast.     . levalbuterol (XOPENEX HFA) 45 MCG/ACT inhaler Inhale 1-2 puffs into the lungs every 8 (eight) hours as needed for wheezing. 1 Inhaler 12  . levothyroxine (SYNTHROID, LEVOTHROID) 150 MCG tablet TAKE 1 TABLET BY MOUTH EVERY DAY BEFORE BREAKFAST (Patient taking differently: Take 150 mcg by mouth daily before breakfast. ) 30 tablet 2  . lidocaine-prilocaine (EMLA) cream Apply 1 application topically as needed. 30 g 6  . metoprolol succinate (TOPROL-XL) 50 MG 24 hr tablet Take 1 tablet (50mg ) by mouth in the morning and 1/2 tablet (25mg ) in the evening. Take with or immediately following a meal. (Patient taking differently: Take 25-50 mg by mouth See admin instructions. Take 1 tablet (50mg ) by mouth in the morning and 1/2 tablet (25mg ) in the evening. Take with or immediately following a meal.) 135 tablet 3  . naproxen (NAPROSYN) 500 MG tablet Take 1 tablet (500 mg total) by mouth 2 (two) times daily with a meal. (Patient not taking: Reported on 04/28/2018) 20 tablet 0  . nitrofurantoin, macrocrystal-monohydrate, (MACROBID) 100 MG capsule Take 1 capsule (100 mg total) by mouth every 12 (twelve) hours. 8 capsule 0  . oxyCODONE-acetaminophen (PERCOCET) 5-325 MG tablet Take 1 tablet by mouth every 6 (six) hours as needed for moderate pain or severe pain. 50 tablet 0  . pregabalin (LYRICA) 50 MG capsule Take 50 mg by mouth 3 (three) times daily.    [name] senna-docusate (SENNA S) 8.6-50 MG tablet Take 2 tablets by mouth 2 (two) times daily. May reduce to 2 tab po HS as needed once regular BM estabished (Patient taking differently: Take 2 tablets by mouth 2 (two) times daily as needed (FOR CONSTIPATION). =) 60 tablet 1  . traZODone (DESYREL) 50 MG tablet Take 1-2 tablets (50-100 mg total) by mouth at bedtime as needed for sleep. (Patient not taking: Reported on 04/28/2018) 60 tablet 0  . Vitamins A & D (VITAMIN A & D) OINT Apply 1 application topically 2 (two)  times daily. 1 Tube 3   No current facility-administered medications for this visit.    Facility-Administered Medications Ordered in Other Visits  Medication Dose Route Frequency Provider Last Rate Last Dose  . sodium chloride 0.9 % injection  10 mL  10 mL Intravenous PRN [name], MD   10 mL at 05/24/16 1252    REVIEW OF SYSTEMS:  On review of systems, the patient reports that she is doing well overall. She denies any chest pain, increased shortness of breath, fevers, chills, night sweats, or unintended weight changes. She has noted increased swelling in the right arm and right breast.  She denies erythema or increased warmth in the arm or breast but does complain of tightness/discomfort when the swelling progresses throughout the day. She also notes tightness/discomfort in the right side of her neck intermittently.  She denies any bowel or bladder disturbances, nausea or vomiting.  She continues with significant weakness in the right arm and a new tremor in her hand when she attempts to use it.  She is working with PT in her home to regain strength and mobility of the right arm.  A complete review of systems is obtained and is otherwise negative.    PHYSICAL EXAM:  Wt Readings from Last 3 Encounters:  05/02/18 282 lb 8 oz (128.1 kg)  04/28/18 278 lb (126.1 kg)  04/03/18 276 lb 11.2 oz (125.5 kg)   Temp Readings from Last 3 Encounters:  05/02/18 97.9 F (36.6 C) (Oral)  04/30/18 98 F (36.7 C) (Oral)  04/17/18 98 F (36.7 C) (Oral)   BP Readings from Last 3 Encounters:  05/02/18 121/84  04/30/18 118/82  04/17/18 124/77   Pulse Readings from Last 3 Encounters:  05/02/18 (!) 112  04/30/18 96  04/17/18 100   In general this is a well appearing African-American female in no acute distress.  She's alert and oriented x4 and appropriate throughout the examination. Cardiopulmonary assessment is negative for acute distress and she exhibits normal effort.  The right axillary  lymphadenopathy has resolved but there is mild increased edema in the right upper extremity and right breast. There are no visible skin changes or lesions noted and no palpable cervical adenopathy or JVD.  LABORATORY DATA:  Lab Results  Component Value Date   WBC 6.3 05/02/2018   HGB 9.4 (L) 05/02/2018   HCT 29.4 (L) 05/02/2018   MCV 91.6 05/02/2018   PLT 264 05/02/2018   Lab Results  Component Value Date   NA 139 05/02/2018   K 3.8 05/02/2018   CL 104 05/02/2018   CO2 25 05/02/2018   Lab Results  Component Value Date   ALT 9 05/02/2018   AST 12 (L) 05/02/2018   ALKPHOS 102 05/02/2018   BILITOT 0.2 (L) 05/02/2018     RADIOGRAPHY: Dg Chest 2 View  Result Date: 04/03/2018 CLINICAL DATA:  Increasing shortness of breath over the past week. Metastatic lung cancer. EXAM: CHEST - 2 VIEW COMPARISON:  07/29/2017 and chest CT dated 03/01/2018. FINDINGS: Normal sized heart. Stable post radiation changes and scarring on the right. Interval mild left lower lobe atelectasis. Right jugular porta catheter tip in the proximal superior vena cava. Mild scoliosis. Mild thoracic spine degenerative changes. IMPRESSION: Interval mild left lower lobe atelectasis. Electronically Signed   By: Claudie Revering M.D.   On: 04/03/2018 14:43   Ct Angio Chest Pe W Or Wo Contrast  Result Date: 04/03/2018 CLINICAL DATA:  Chest pain and shortness of breath. Lung carcinoma receiving chemotherapy EXAM: CT ANGIOGRAPHY CHEST WITH CONTRAST TECHNIQUE: Multidetector CT imaging of the chest was performed using the standard protocol during bolus administration of intravenous contrast. Multiplanar CT image reconstructions and MIPs were obtained to evaluate the vascular anatomy. CONTRAST:  116mL ISOVUE-370 IOPAMIDOL (ISOVUE-370) INJECTION 76% COMPARISON:  Chest CT March 01, 2018; chest radiograph April 03, 2018 FINDINGS: Cardiovascular: There is no demonstrable pulmonary embolus. There is no thoracic aortic aneurysm or  dissection. There is no pericardial effusion or pericardial thickening. Port-A-Cath tip is at the cavoatrial junction. Mediastinum/Nodes: Limited visualization of thyroid. There is extensive right-sided subpectoral and axillary adenopathy, also present on prior study. The largest lymph node in this area is in the right axillary region measuring 5.4 x 3.2 cm, unchanged from most recent study. No progression of adenopathy in this area is evident. There is adenopathy in the right para carinal region. A lymph node anterior to the carina on the right measures 2.2 x 1.7 cm, stable. There are prominent subcarinal lymph nodes, largest measuring 1.3 x 1.2 cm. No new adenopathy is evident. No esophageal lesions are appreciable. There are several conglomerated lymph nodes in this area vitamin this shunt Lungs/Pleura: There is a right pleural effusion, larger than on recent study. There is probable post radiation therapy change in the right perihilar regions with areas of retraction and localized bronchiectasis. There is also consolidation and bronchiectatic change in the right lower lobe, stable. This appearance is similar to most recent study. No new parenchymal lung opacity evident. Upper Abdomen: Visualized upper abdominal structures appear unremarkable. Musculoskeletal: No blastic or lytic bone lesions are evident. There are no appreciable chest wall lesions. Review of the MIP images confirms the above findings. IMPRESSION: 1. No demonstrable pulmonary embolus. No thoracic aortic aneurysm or dissection. 2. Stable extensive adenopathy, most marked in the right subpectoral and axillary regions. No new adenopathy. 3. Consolidation right perihilar and right lower lobe regions. Probable post radiation therapy changes right perihilar region. Lower lobe bronchiectatic change. No new parenchymal lung opacity. There is a right pleural effusion which is slightly larger than 1 month prior. Electronically Signed   By: Lowella Grip III M.D.   On: 04/03/2018 16:56   Mr Jeri Cos RS Contrast  Result Date: 04/29/2018 CLINICAL DATA:  Left arm weakness, right facial numbness. Metastatic lung cancer. EXAM: MRI HEAD WITHOUT AND WITH CONTRAST MRA HEAD WITHOUT CONTRAST TECHNIQUE: Multiplanar, multiecho pulse sequences of the brain and surrounding structures were obtained without and with intravenous contrast. Angiographic images of the head were obtained using MRA technique without contrast. CONTRAST:  10 mL Gadavist COMPARISON:  None. FINDINGS: MRI HEAD FINDINGS BRAIN: There is no acute infarct, acute hemorrhage or mass effect. The midline structures are normal. There are no old infarcts. The white matter signal is normal for the patient's age. The CSF spaces are normal for age, with no hydrocephalus. Susceptibility-sensitive sequences show no chronic microhemorrhage or superficial siderosis. SKULL AND UPPER CERVICAL SPINE: The visualized skull base, calvarium, upper cervical spine and extracranial soft tissues are normal. SINUSES/ORBITS: No fluid levels or advanced mucosal thickening. No mastoid or middle ear effusion. The orbits are normal. MRA HEAD FINDINGS POSTERIOR CIRCULATION: --Basilar artery: Normal. --Posterior cerebral arteries: Normal.  Bilateral fetal origins. --Superior cerebellar arteries: Normal. --Inferior cerebellar arteries: Normal posterior inferior cerebellar arteries. Anterior inferior cerebellar arteries are not clearly visible, but this is not uncommon. ANTERIOR CIRCULATION: --Intracranial internal carotid arteries: Normal. --Anterior cerebral arteries: Normal. Both A1 segments are present. Patent anterior communicating artery. --Middle cerebral arteries: Normal. --Posterior communicating arteries: Present bilaterally. IMPRESSION: 1. Normal MRI of the brain. 2. Normal intracranial MRA. Electronically Signed   By: Ulyses Jarred M.D.   On: 04/29/2018 06:03   Mr Cervical Spine W Wo Contrast  Result Date:  04/29/2018 CLINICAL DATA:  Left arm weakness and numbness. Metastatic lung cancer. EXAM: MRI CERVICAL SPINE WITHOUT AND WITH CONTRAST TECHNIQUE: Multiplanar and multiecho pulse sequences of the cervical spine, to include the craniocervical junction and cervicothoracic junction, were obtained without and with intravenous contrast. CONTRAST:  10 mL Gadavist COMPARISON:  None. FINDINGS: Alignment: Normal. Vertebrae: No focal marrow lesion. No compression fracture or evidence of discitis osteomyelitis. Cord: Normal caliber and signal. Posterior Fossa, vertebral arteries, paraspinal tissues: Visualized posterior fossa is normal. Vertebral artery flow voids are preserved. No prevertebral effusion. Disc levels: Sagittal imaging includes the atlantoaxial joint to the level of the T3-4 disc space, with axial imaging of the disc spaces from C2-3 to C7-T1. Atlantoaxial articulation is normal. C2-3: Normal. C3-4: Normal. C4-5: Normal. C5-6: Small central disc protrusion with mild spinal canal stenosis. No neural foraminal stenosis. C6-7: Left subarticular disc protrusion causes mild spinal canal stenosis and moderate left neural foraminal stenosis. Right neural foramen is patent. C7-T1: Normal. Sagittal imaging of the T1-T4 levels is normal. IMPRESSION: 1. No cervical metastatic disease. 2. 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 3. Unchanged moderate left neural foraminal stenosis at C6-7. Electronically Signed   By: Ulyses Jarred M.D.   On: 04/29/2018 06:09   Mr Jodene Nam Head Wo Contrast  Result Date: 04/29/2018 CLINICAL DATA:  Left arm weakness, right facial numbness. Metastatic lung cancer. EXAM: MRI HEAD WITHOUT AND WITH CONTRAST MRA HEAD WITHOUT CONTRAST TECHNIQUE: Multiplanar, multiecho pulse sequences of the brain and surrounding structures were obtained without and with intravenous contrast. Angiographic images of the head were obtained using MRA technique without contrast. CONTRAST:  10 mL Gadavist  COMPARISON:  None. FINDINGS: MRI HEAD FINDINGS BRAIN: There is no acute infarct, acute hemorrhage or mass effect. The midline structures are normal. There are no old infarcts. The white matter signal is normal for the patient's age. The CSF spaces are normal for age, with no hydrocephalus. Susceptibility-sensitive sequences show no chronic microhemorrhage or superficial siderosis. SKULL AND UPPER CERVICAL SPINE: The visualized skull base, calvarium, upper cervical spine and extracranial soft tissues are normal. SINUSES/ORBITS: No fluid levels or advanced mucosal thickening. No mastoid or middle ear effusion. The orbits are normal. MRA HEAD FINDINGS POSTERIOR CIRCULATION: --Basilar artery: Normal. --Posterior cerebral arteries: Normal.  Bilateral fetal origins. --Superior cerebellar arteries: Normal. --Inferior cerebellar arteries: Normal posterior inferior cerebellar arteries. Anterior inferior cerebellar arteries are not clearly visible, but this is not uncommon. ANTERIOR CIRCULATION: --Intracranial internal carotid arteries: Normal. --Anterior cerebral arteries: Normal. Both A1 segments are present. Patent anterior communicating artery. --Middle cerebral arteries: Normal. --Posterior communicating arteries: Present bilaterally. IMPRESSION: 1. Normal MRI of the brain. 2. Normal intracranial MRA. Electronically Signed   By: Ulyses Jarred M.D.   On: 04/29/2018 06:03   Ct Head Code Stroke Wo Contrast  Result Date: 04/28/2018 CLINICAL DATA:  Code stroke. Initial evaluation for acute right-sided numbness and weakness. EXAM: CT HEAD WITHOUT CONTRAST TECHNIQUE: Contiguous axial images were obtained from the base of the skull through the vertex without intravenous contrast. COMPARISON:  Prior MRI from 11/19/2016 FINDINGS: Brain: Cerebral volume within normal limits for patient age. No evidence for acute intracranial hemorrhage. No findings to suggest acute large vessel territory infarct. No mass lesion, midline shift,  or mass effect. Ventricles are normal in size without evidence for hydrocephalus. No extra-axial fluid collection identified. Vascular: No hyperdense vessel identified. Skull: Scalp soft tissues demonstrate no acute abnormality. Calvarium intact. Sinuses/Orbits: Globes and orbital soft tissues  within normal limits. Visualized paranasal sinuses are clear. No mastoid effusion. ASPECTS [facility] Stroke Program Early CT Score) - Ganglionic level infarction (caudate, lentiform nuclei, internal capsule, insula, M1-M3 cortex): 7 - Supraganglionic infarction (M4-M6 cortex): 3 Total score (0-10 with 10 being normal): 10 IMPRESSION: 1. Negative head CT.  No acute intracranial abnormality identified 2. ASPECTS is 10. Critical Value/emergent results were called by telephone at the time of interpretation on 04/28/2018 at 10:38 pm to Dr. Julianne Rice , who verbally acknowledged these results. Electronically Signed   By: Jeannine Boga M.D.   On: 04/28/2018 22:40   Vas US Carotid (at [place] Only)  Result Date: 04/29/2018 Carotid Arterial Duplex Study Indications:  CVA. Risk Factors: Hypertension, hyperlipidemia, Diabetes. Performing Technologist: Maudry Mayhew MHA, RDMS, RVT, RDCS  Examination Guidelines: A complete evaluation includes B-mode imaging, spectral Doppler, color Doppler, and power Doppler as needed of all accessible portions of each vessel. Bilateral testing is considered an integral part of a complete examination. Limited examinations for reoccurring indications may be performed as noted.  Right Carotid Findings: +----------+--------+--------+--------+-----------------------+--------+           PSV cm/sEDV cm/sStenosisDescribe               Comments +----------+--------+--------+--------+-----------------------+--------+ CCA Prox  73      16                                              +----------+--------+--------+--------+-----------------------+--------+ CCA Distal58      24               smooth and heterogenous         +----------+--------+--------+--------+-----------------------+--------+ ICA Prox  58      23              smooth and heterogenous         +----------+--------+--------+--------+-----------------------+--------+ ICA Distal63      23                                              +----------+--------+--------+--------+-----------------------+--------+ ECA       71      12                                              +----------+--------+--------+--------+-----------------------+--------+ +----------+--------+-------+--------------+-------------------+           PSV cm/sEDV cmsDescribe      Arm Pressure (mmHG) +----------+--------+-------+--------------+-------------------+ Subclavian               Not identified                    +----------+--------+-------+--------------+-------------------+ +---------+--------+--------+-------------------+ VertebralPSV cm/sEDV cm/sUnable to visualize +---------+--------+--------+-------------------+ Anechoic area in the right lateral neck (area of pain) measuring 2.1cm. Suggestive of possible lymph node versus unknown etiology.  Left Carotid Findings: +----------+--------+-------+--------+----------------------+------------------+           PSV cm/sEDV    StenosisDescribe              Comments  cm/s                                                    +----------+--------+-------+--------+----------------------+------------------+ CCA Prox  119     31                                                      +----------+--------+-------+--------+----------------------+------------------+ CCA Distal69      22                                   intimal thickening +----------+--------+-------+--------+----------------------+------------------+ ICA Prox  121     39             smooth and                                                                 heterogenous                             +----------+--------+-------+--------+----------------------+------------------+ ICA Distal49      21                                                      +----------+--------+-------+--------+----------------------+------------------+ ECA       91      19                                                      +----------+--------+-------+--------+----------------------+------------------+ +----------+--------+--------+----------------+-------------------+ SubclavianPSV cm/sEDV cm/sDescribe        Arm Pressure (mmHG) +----------+--------+--------+----------------+-------------------+           55              Multiphasic, WNL                    +----------+--------+--------+----------------+-------------------+ +---------+--------+--+--------+--+---------+ VertebralPSV cm/s31EDV cm/s11Antegrade +---------+--------+--+--------+--+---------+ There are multiple hypoechoic heterogenous areas in the left clavicular area, suggestive of possible lymph node versus unknown etiology.  Summary: Right Carotid: Velocities in the right ICA are consistent with a 1-39% stenosis. Left Carotid: Velocities in the left ICA are consistent with a 1-39% stenosis. Vertebrals:  Left vertebral artery demonstrates antegrade flow. Right vertebral              artery was not visualized. Subclavians: Right subclavian artery was not visualized. Normal flow              hemodynamics were seen in the left subclavian artery. *See table(s) above for measurements and observations.  Electronically signed by Servando Snare MD on 04/29/2018 at 1:30:01 PM.  Final       IMPRESSION/PLAN: 1. 53 y.o. 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 She appears to have recovered well from the effects of her recent radiotherapy.  Clinically, she does appear to have some right upper extremity lymphedema  which could be secondary to disease progression versus effects from her recent radiotherapy to the right axilla.  Dr. Irene Limbo has ordered a venous Doppler ultrasound of the right upper extremity to rule out DVT and if this exam is negative, I would recommend having her fitted for a lymphedema sleeve to help manage her swelling/discomfort.  We discussed that while we are happy to continue to participate in her care if clinically indicated, at this point, we will plan to see her back on an as-needed basis.  She will continue in routine follow-up under the care and direction of Dr. Irene Limbo for management of her overall disease.  She appears to have a good understanding of our recommendations and is in agreement.  She knows to call at anytime with any questions or concerns related to her previous radiotherapy.   Nicholos Johns, MMS, PA-C [facility]: [phone]  Fax: [phone]

## 2018-05-02 NOTE — Addendum Note (Signed)
Encounter addended by: Freeman Caldron, PA-C on: 05/02/2018 10:07 AM  Actions taken: LOS modified

## 2018-05-02 NOTE — Addendum Note (Signed)
Addended by: Kasandra Knudsen A on: 05/02/2018 09:57 AM   Modules accepted: Orders

## 2018-05-03 ENCOUNTER — Ambulatory Visit: Payer: Self-pay | Admitting: Urology

## 2018-05-03 ENCOUNTER — Telehealth: Payer: Self-pay | Admitting: *Deleted

## 2018-05-03 ENCOUNTER — Ambulatory Visit (HOSPITAL_COMMUNITY)
Admission: RE | Admit: 2018-05-03 | Discharge: 2018-05-03 | Disposition: A | Discharge status: Home / Self Care | Payer: Medicaid Other | Source: Ambulatory Visit | Attending: Hematology | Admitting: Hematology

## 2018-05-03 DIAGNOSIS — C3491 Malignant neoplasm of unspecified part of right bronchus or lung: Secondary | ICD-10-CM | POA: Diagnosis present

## 2018-05-03 DIAGNOSIS — R0602 Shortness of breath: Secondary | ICD-10-CM

## 2018-05-03 NOTE — Telephone Encounter (Signed)
Received voice mail: Luetta Nutting, PT with [facility] saw patient today for assessment. Per [employer], patient oxygen level decreased to 80% with 50 foot ambulation, taking 2-3 minutes to return to 90%. This information given to Dr. Irene Limbo.  PT Needs orders to continue therapy 2x week. Can take verbal order. Call back [phone]. Per Dr. Irene Limbo, please give order. Attempted to contact Amber, left message to call office in am.

## 2018-05-03 NOTE — Progress Notes (Signed)
Bilateral upper extremity venous duplex has been completed. Negative for DVT. Results were given to [facility] at Dr. Grier Mitts office.   05/03/18 2:33 PM Carlos Levering RVT

## 2018-05-04 ENCOUNTER — Telehealth: Payer: Self-pay | Admitting: *Deleted

## 2018-05-04 ENCOUNTER — Encounter (HOSPITAL_COMMUNITY): Payer: Self-pay

## 2018-05-04 NOTE — Telephone Encounter (Signed)
Contacted by [employer], PT with [facility]. She completed initial evaluation of patient, recommending 2 x week for 2 weeks with re-eval to follow.  Per Dr. Irene Limbo: Verbal order received for 2 x week for 2 weeks with re-eval.  Dr. Irene Limbo asked if Lymphedema Clinic type services were available through [facility]. Amber stated she was not aware of specific clinic services, although nurses will wrap patient arms when ordered. Will advise MD of this information. Amber will update Dr. Grier Mitts office on patient treatment as appropriate.

## 2018-05-05 ENCOUNTER — Encounter: Payer: Self-pay | Admitting: Pharmacy Technician

## 2018-05-05 ENCOUNTER — Other Ambulatory Visit: Payer: Self-pay | Admitting: Hematology

## 2018-05-05 DIAGNOSIS — I890 Lymphedema, not elsewhere classified: Secondary | ICD-10-CM

## 2018-05-09 ENCOUNTER — Telehealth: Payer: Self-pay | Admitting: *Deleted

## 2018-05-09 IMAGING — CR DG ELBOW COMPLETE 3+V*L*
4 series · 4 of 4 positions shown · non-contrast
Comparison: None.

CLINICAL DATA: Fall.  Left arm pain

EXAM:
LEFT ELBOW - COMPLETE 3+ VIEW

[t elbow lat left]
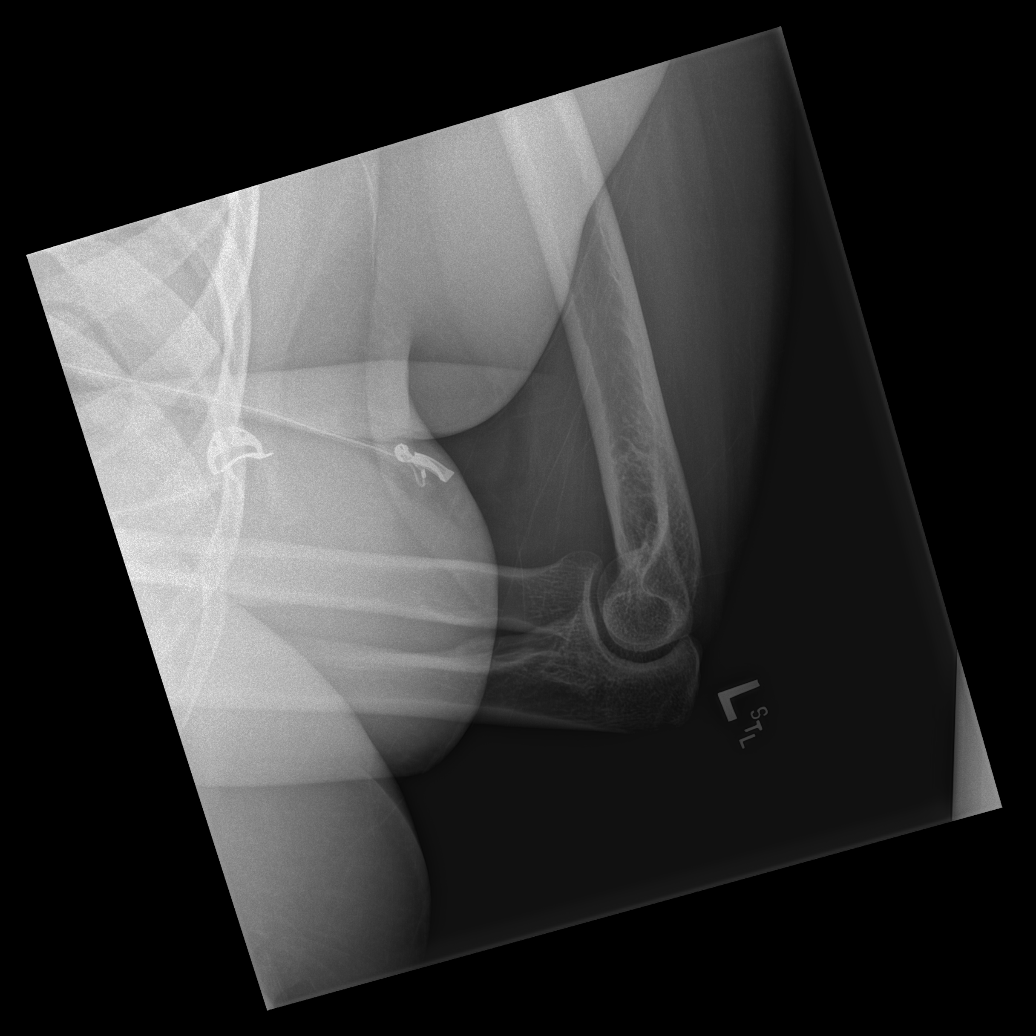

[x elbow ap left]
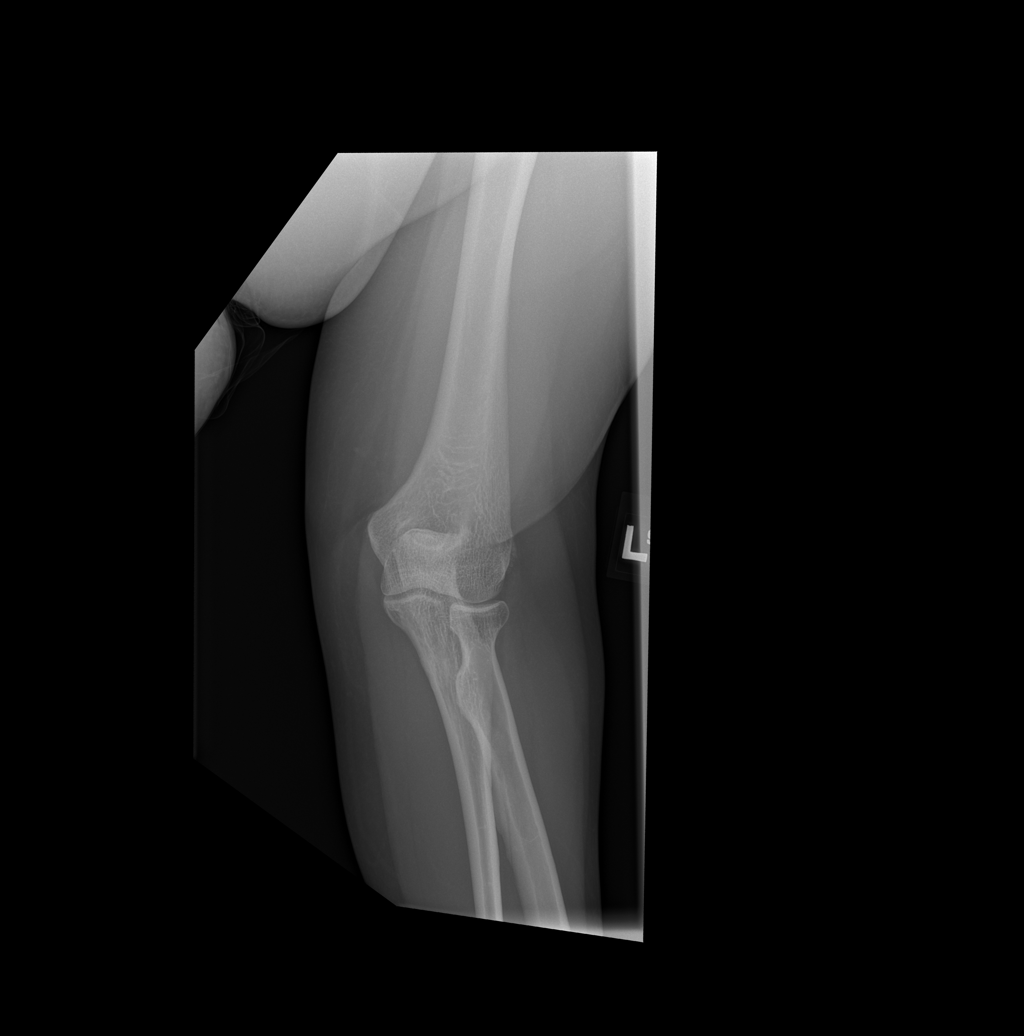

[x elbow obl left (1 of 2)]
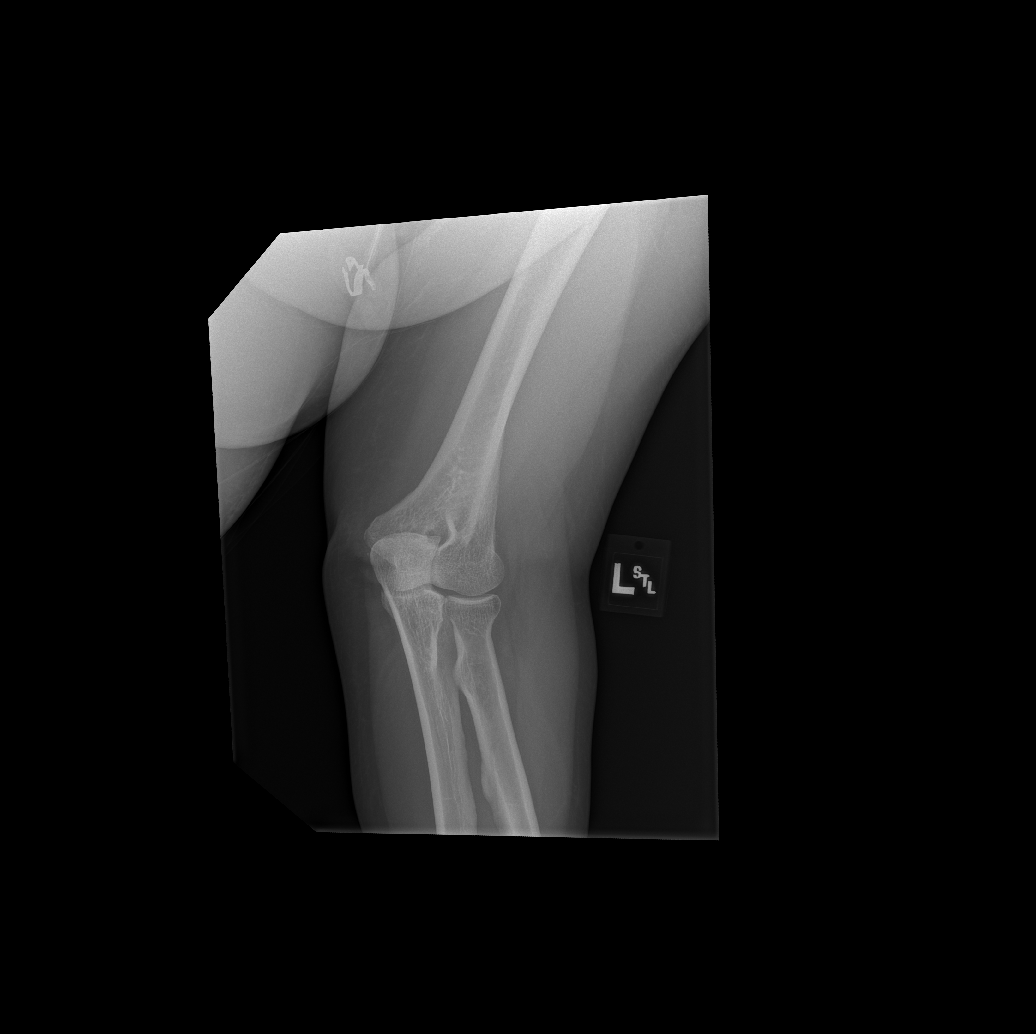

[x elbow obl left (2 of 2)]
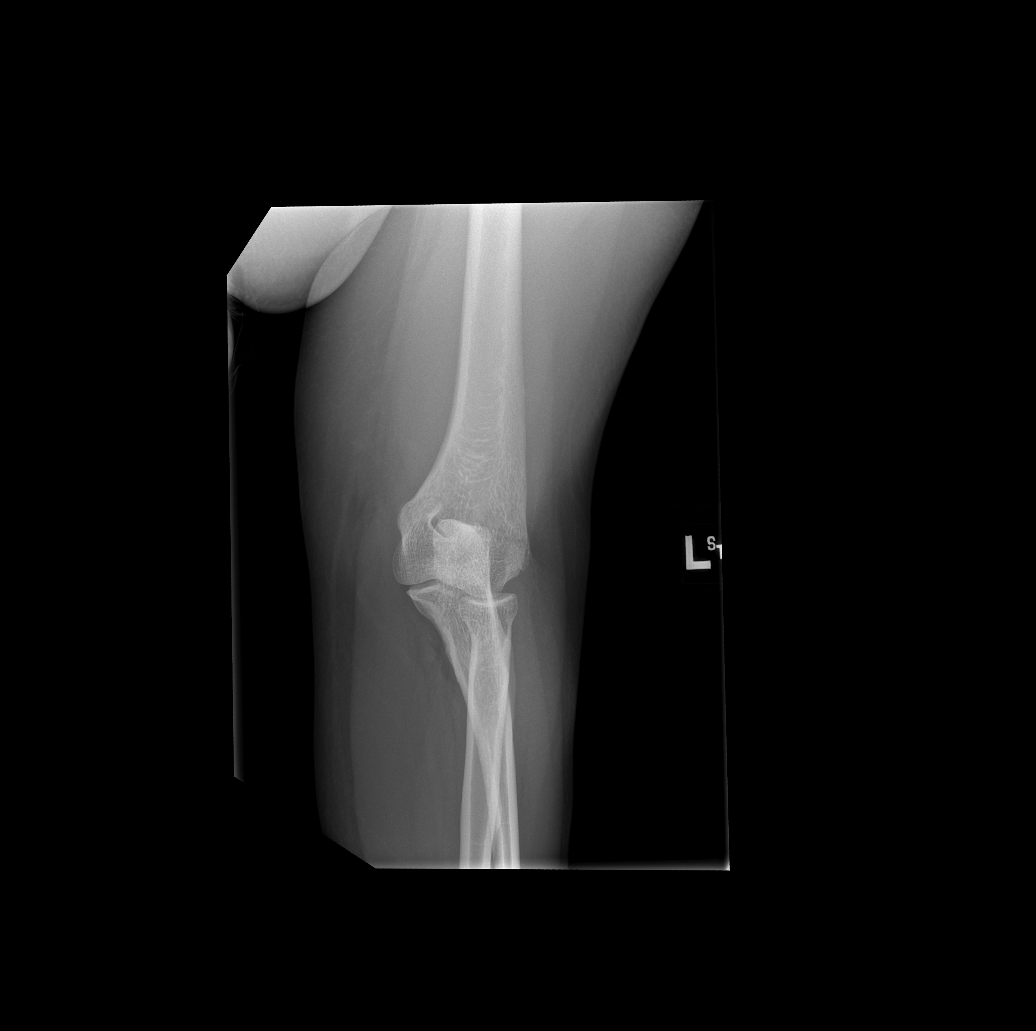

[4 of 4 positions shown; findings below may reference images not displayed]

FINDINGS: There is no evidence of fracture, dislocation, or joint effusion.
There is no evidence of arthropathy or other focal bone abnormality.
Soft tissues are unremarkable.
IMPRESSION: Negative.

## 2018-05-09 IMAGING — CR DG HUMERUS 2V *L*
3 series · 3 of 3 positions shown · non-contrast
Comparison: Shoulder and elbow films of same date.

CLINICAL DATA: Left arm pain after a fall.

EXAM:
LEFT HUMERUS - 2+ VIEW

[w humerus ap left]
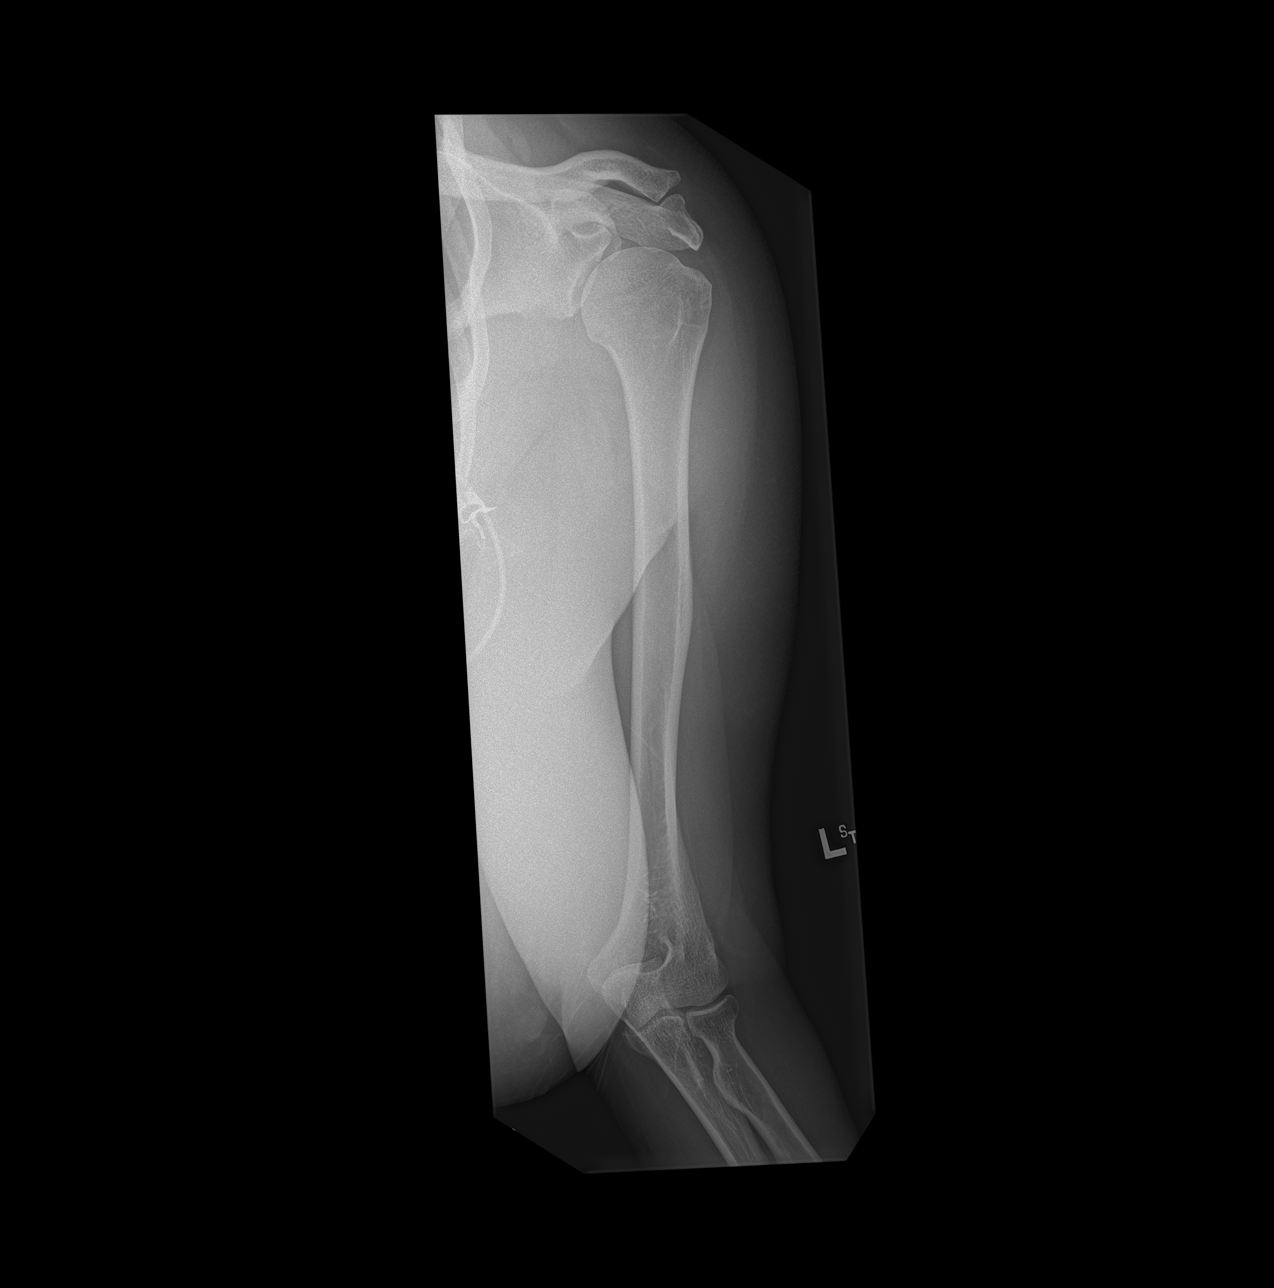

[t humerus lat left (1 of 2)]
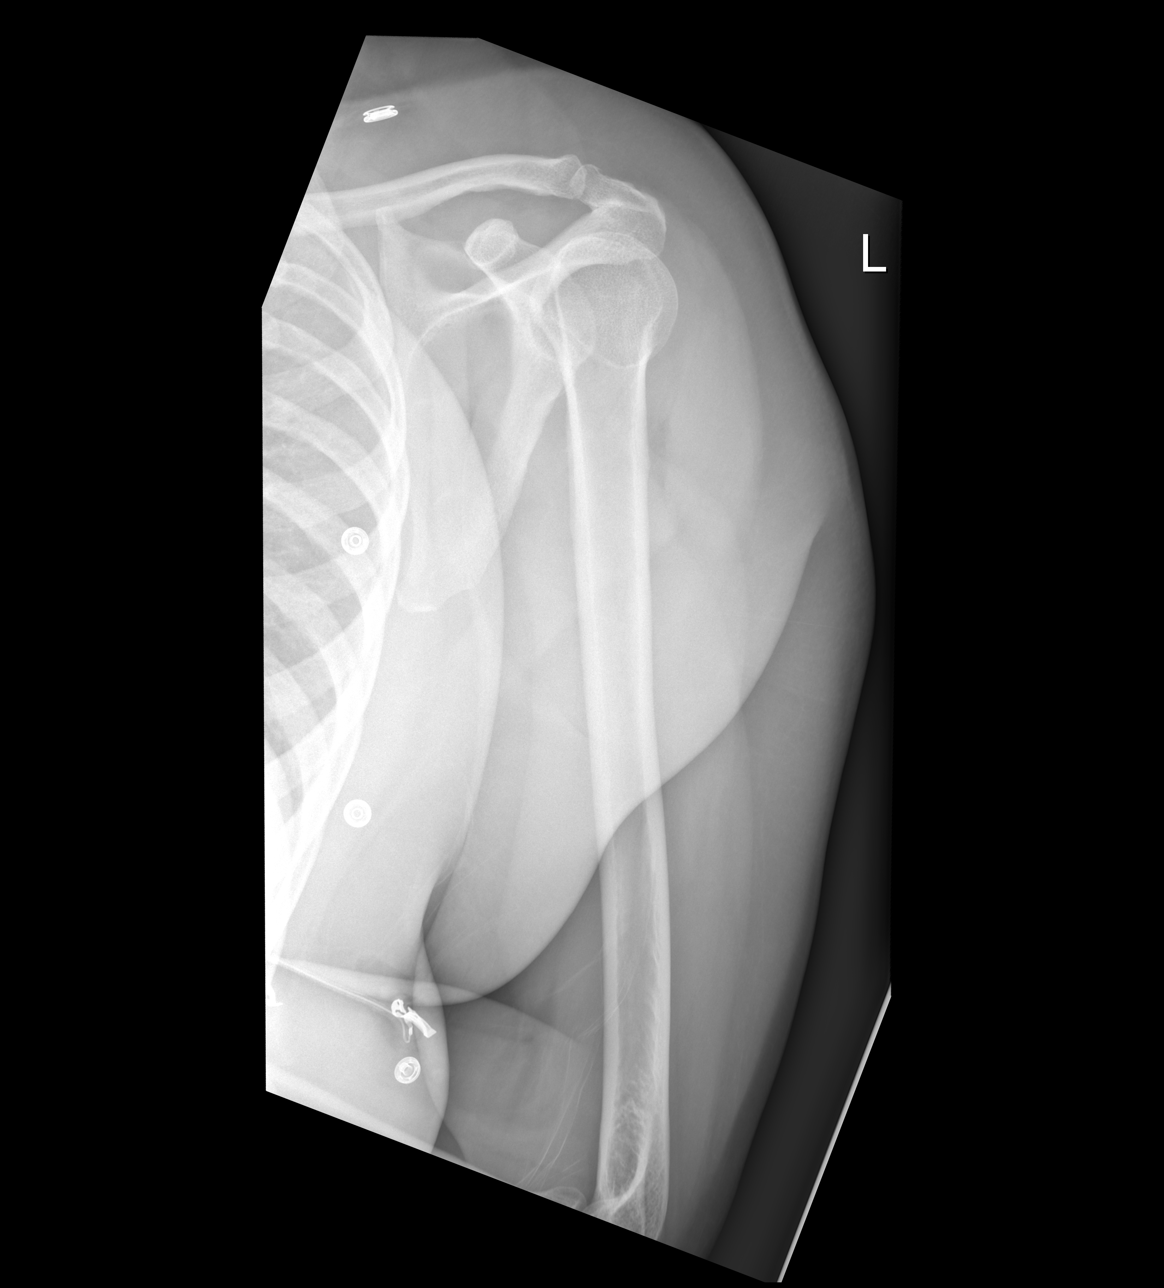

[t humerus lat left (2 of 2)]
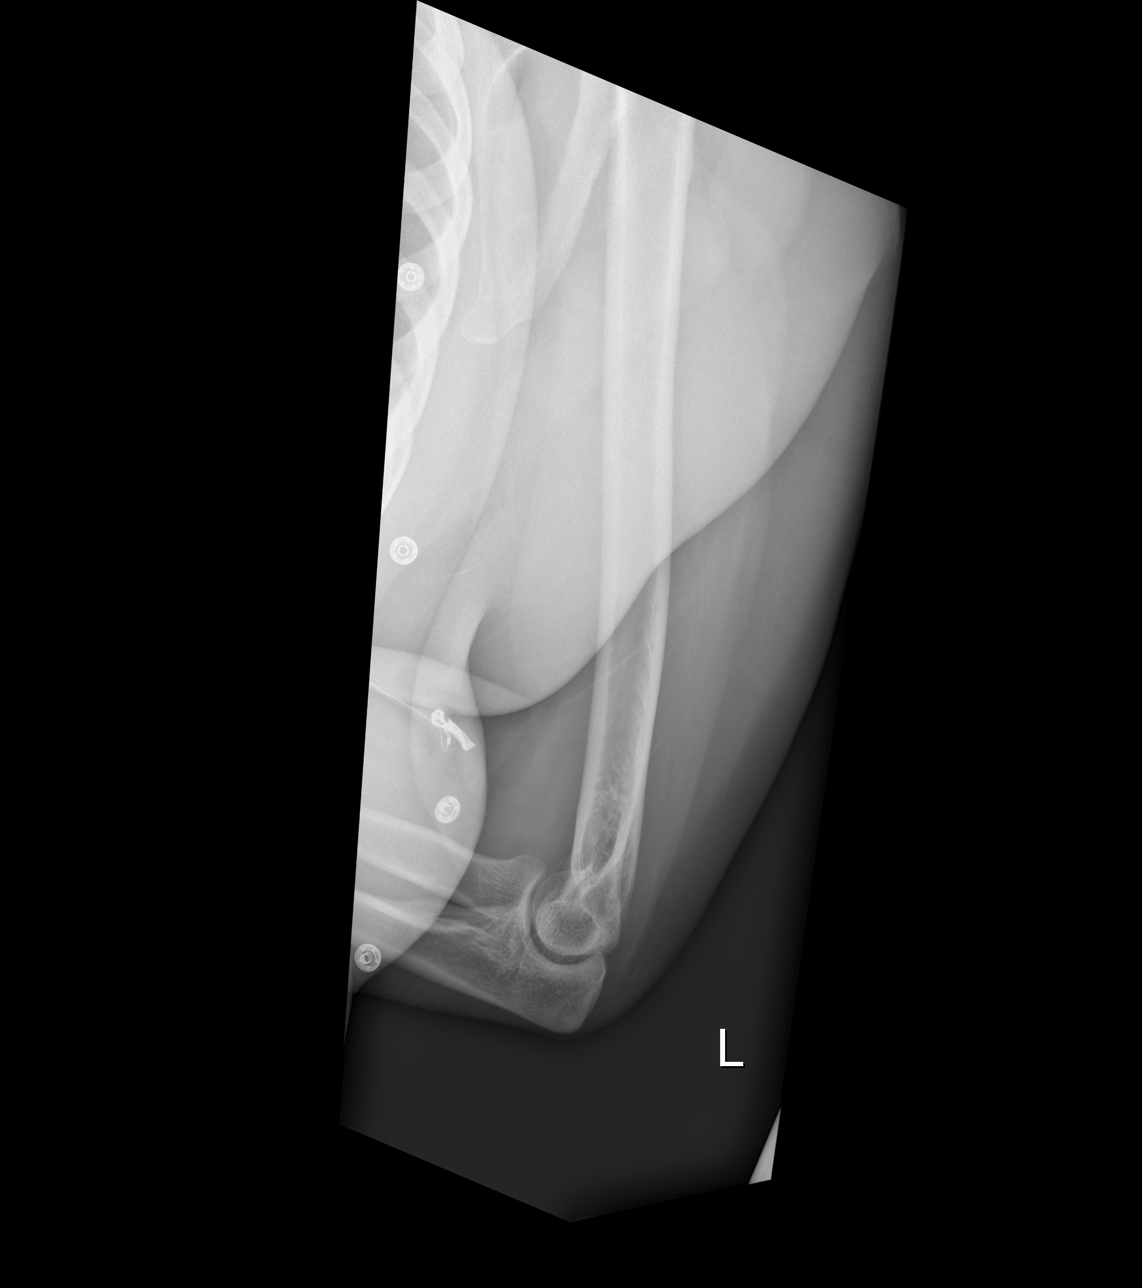

[3 of 3 positions shown; findings below may reference images not displayed]

FINDINGS: No acute fracture or dislocation.
IMPRESSION: No acute osseous abnormality.

## 2018-05-09 IMAGING — CR DG KNEE COMPLETE 4+V*L*
4 series · 4 of 4 positions shown · non-contrast
Comparison: None.

CLINICAL DATA: Fall.  Left leg pain

EXAM:
LEFT KNEE - COMPLETE 4+ VIEW

[x knee ap left (1 of 3)]
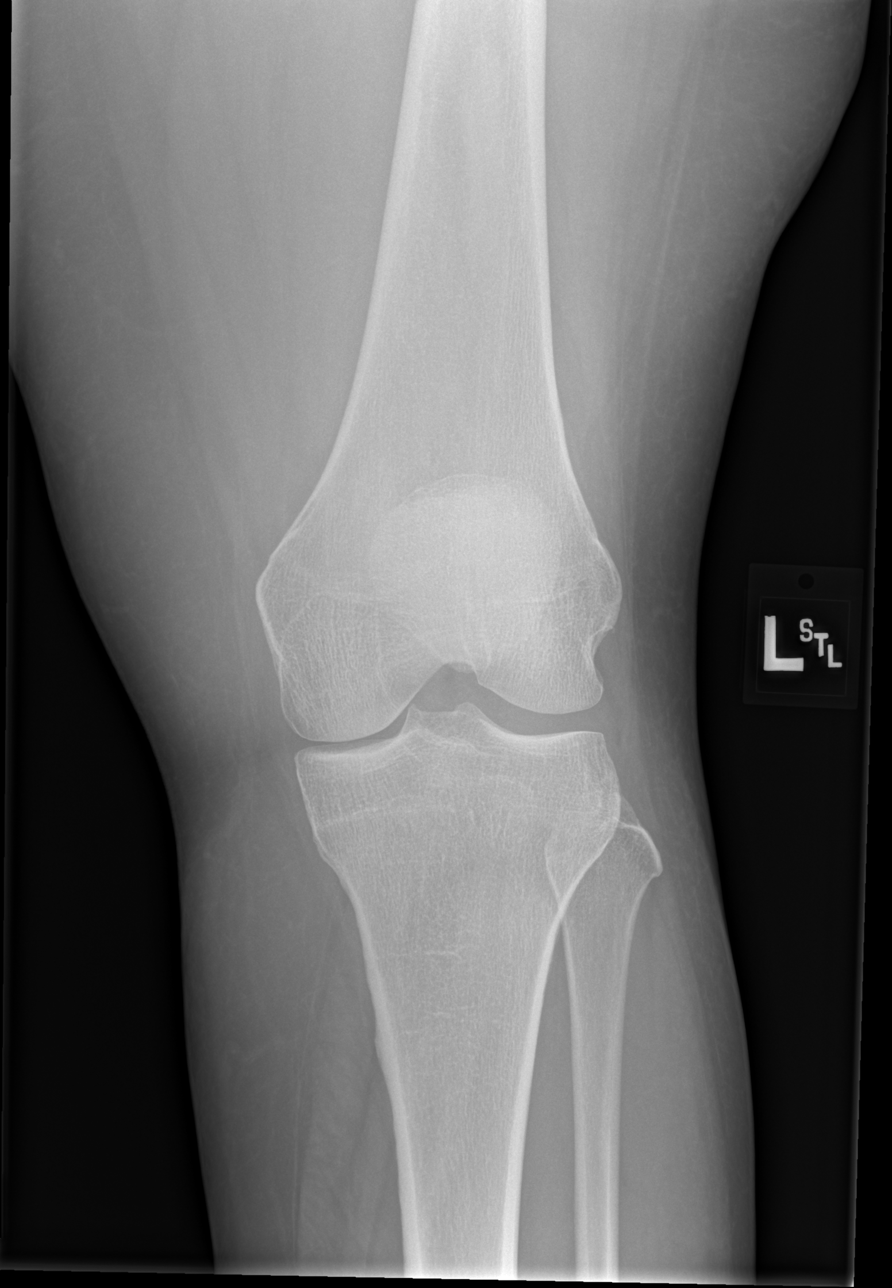

[x knee ap left (2 of 3)]
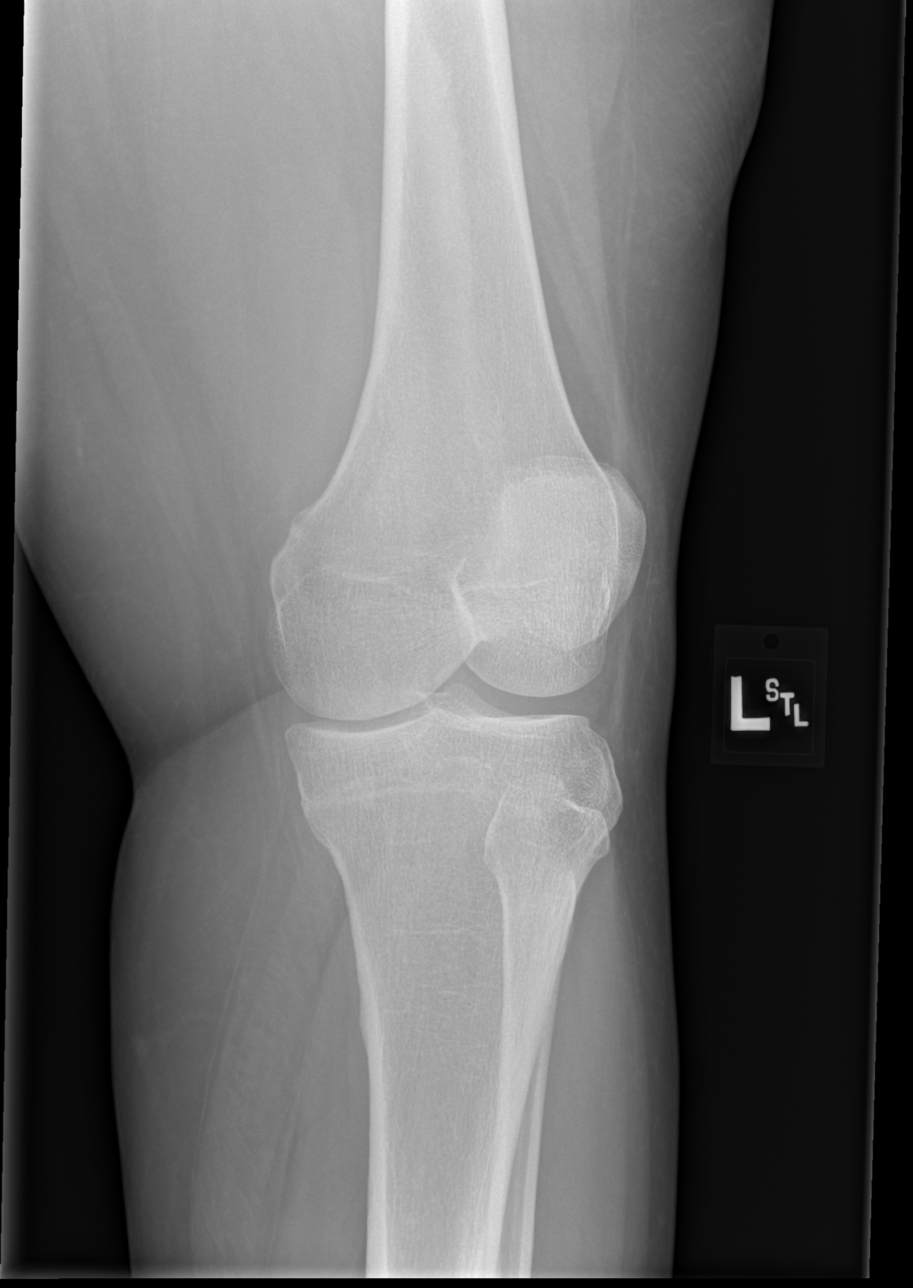

[x knee ap left (3 of 3)]
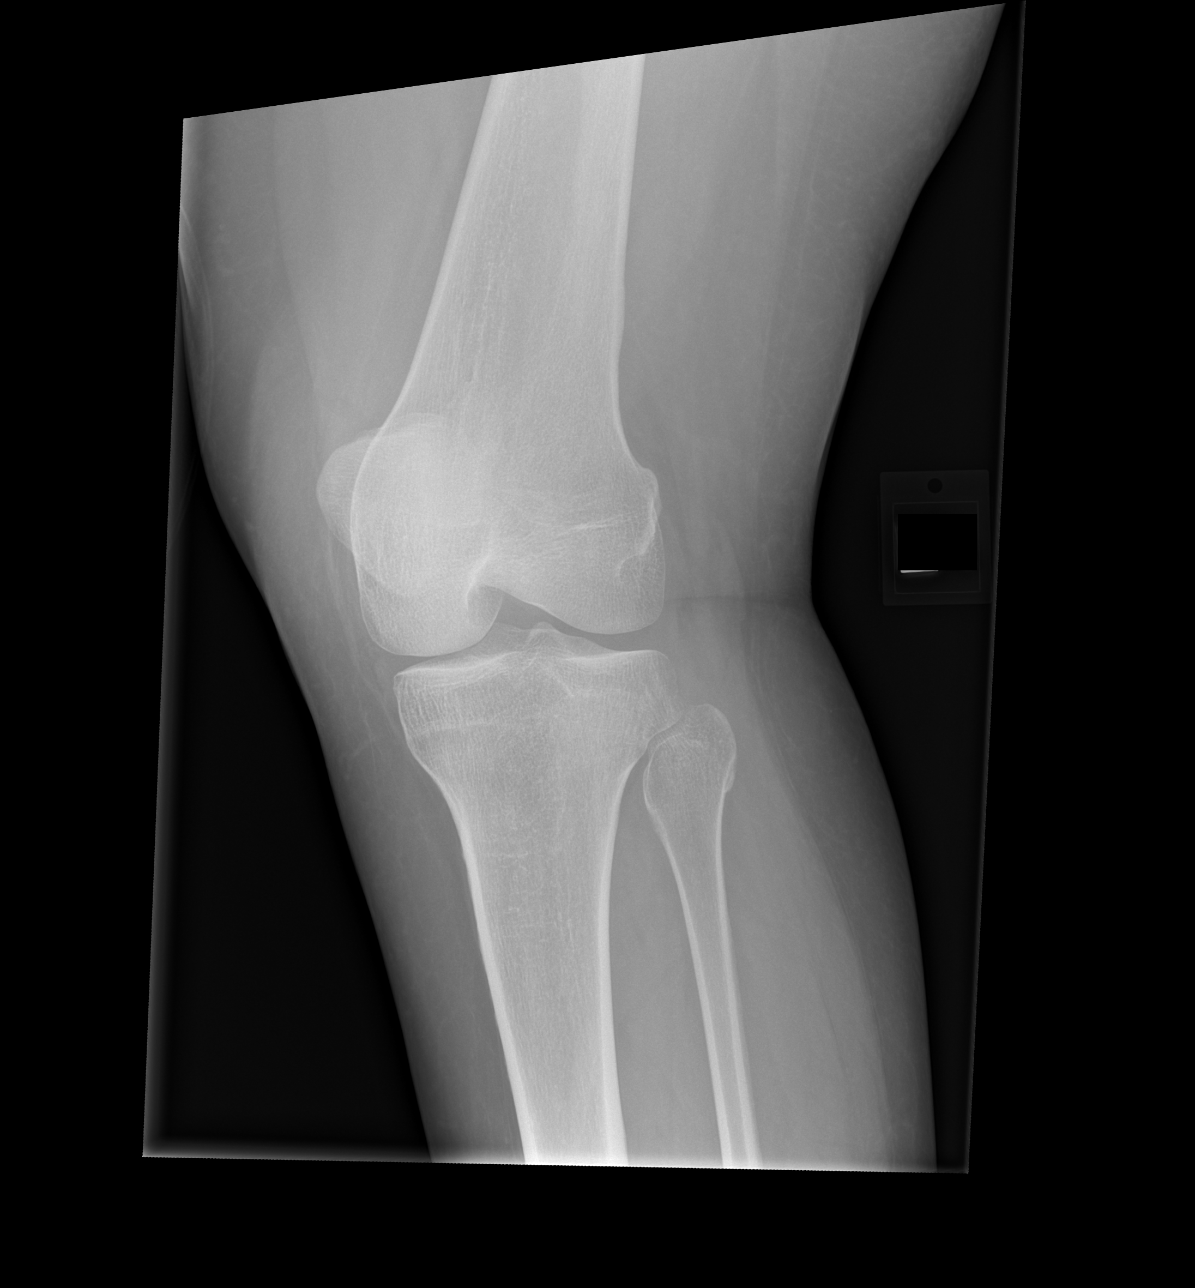

[x knee lat left]
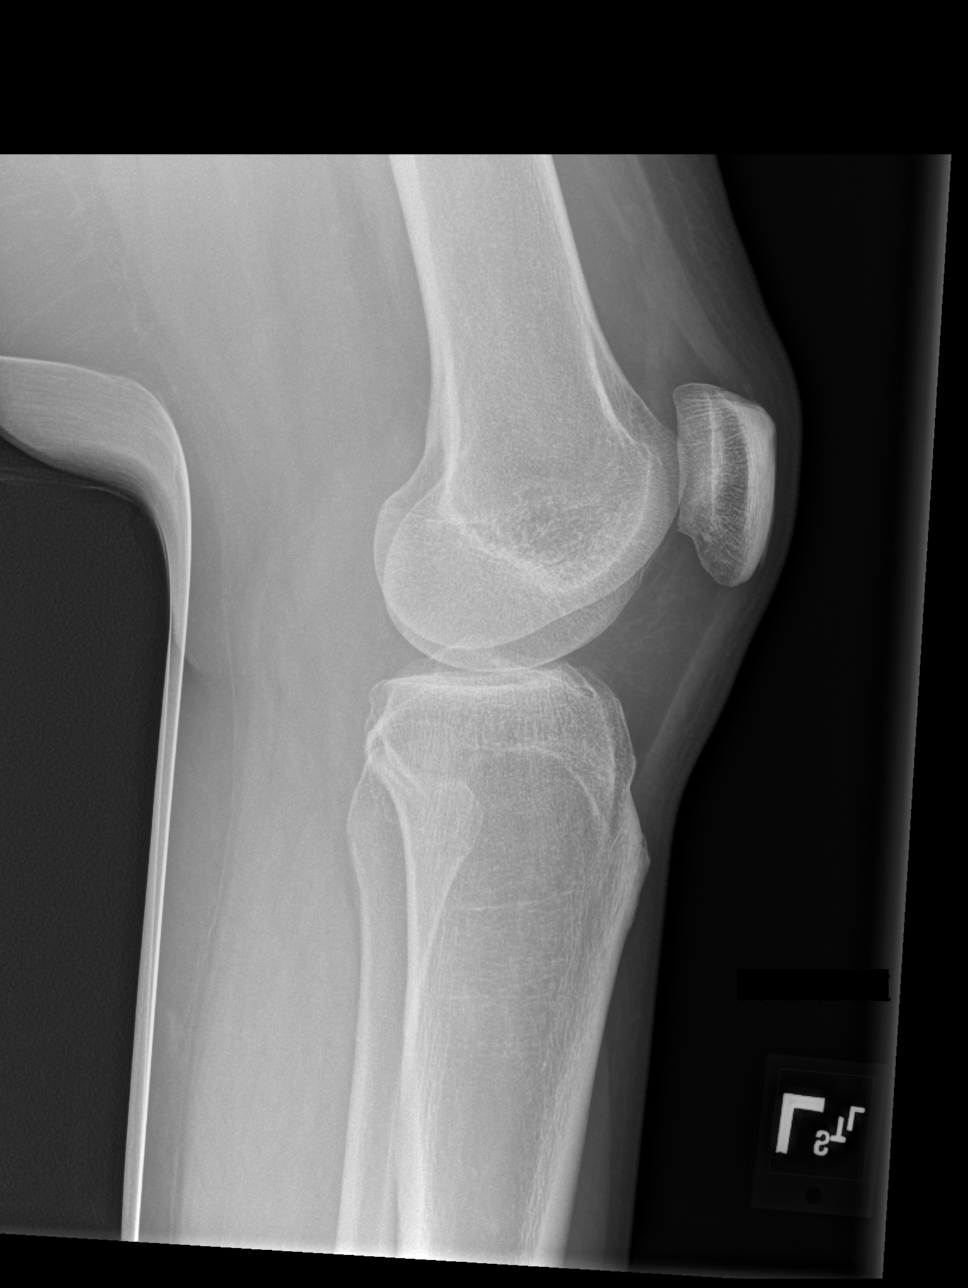

[4 of 4 positions shown; findings below may reference images not displayed]

FINDINGS: No evidence of fracture, dislocation, or joint effusion. No evidence
of arthropathy or other focal bone abnormality. Soft tissues are
unremarkable.
IMPRESSION: Negative.

## 2018-05-09 IMAGING — CR DG ANKLE COMPLETE 3+V*L*
3 series · 3 of 3 positions shown · non-contrast
Comparison: None.

CLINICAL DATA: Fall, left ankle pain

EXAM:
LEFT ANKLE COMPLETE - 3+ VIEW

[x ankle obl left]
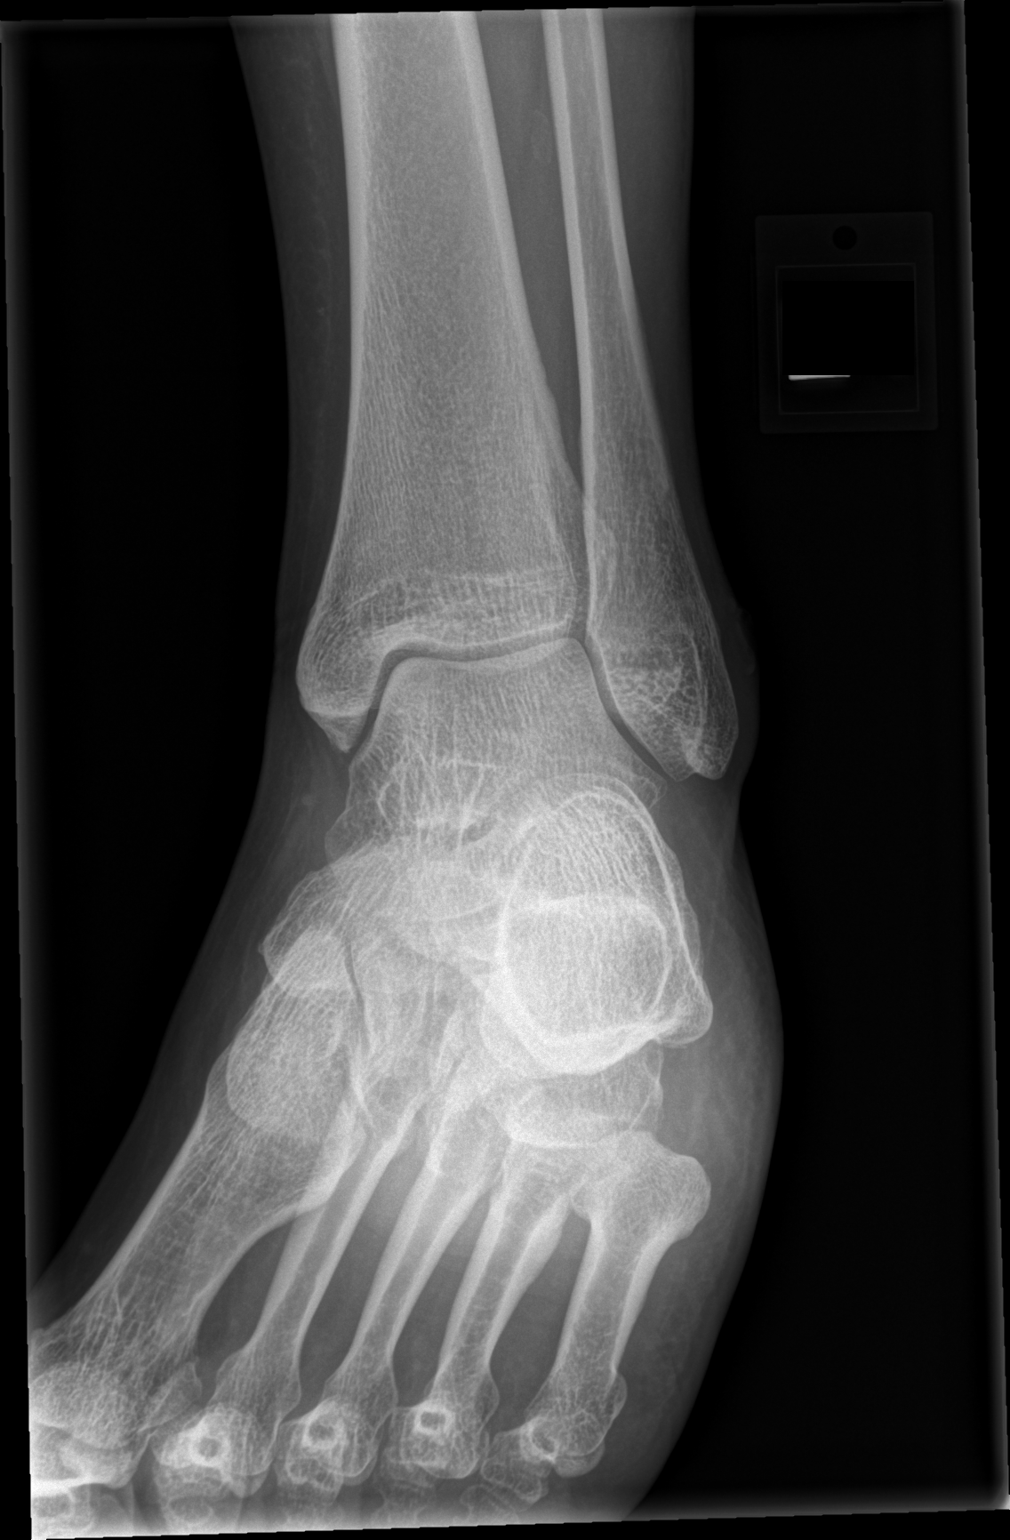

[x ankle ap left]
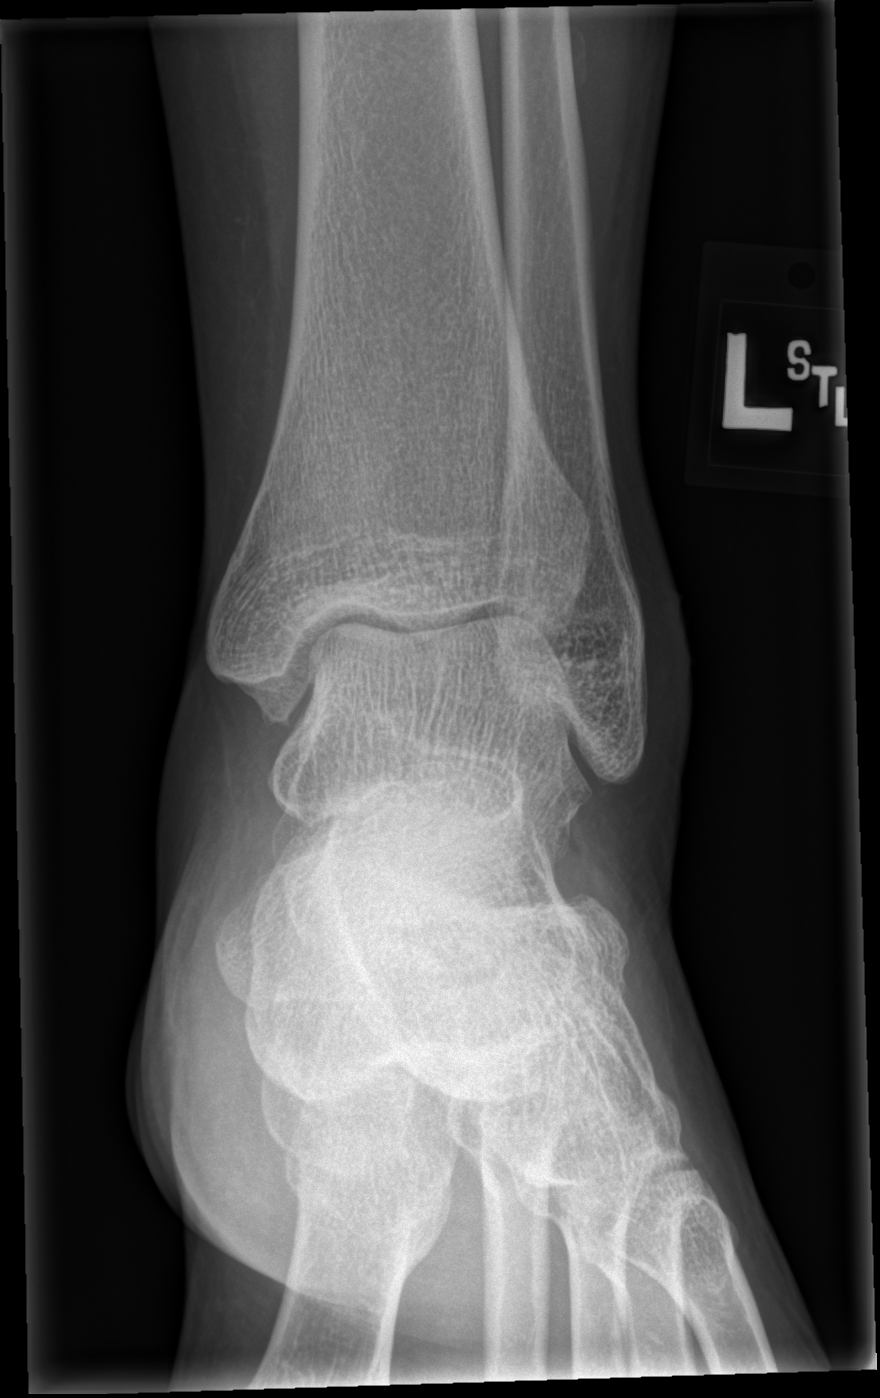

[x ankle lat left]
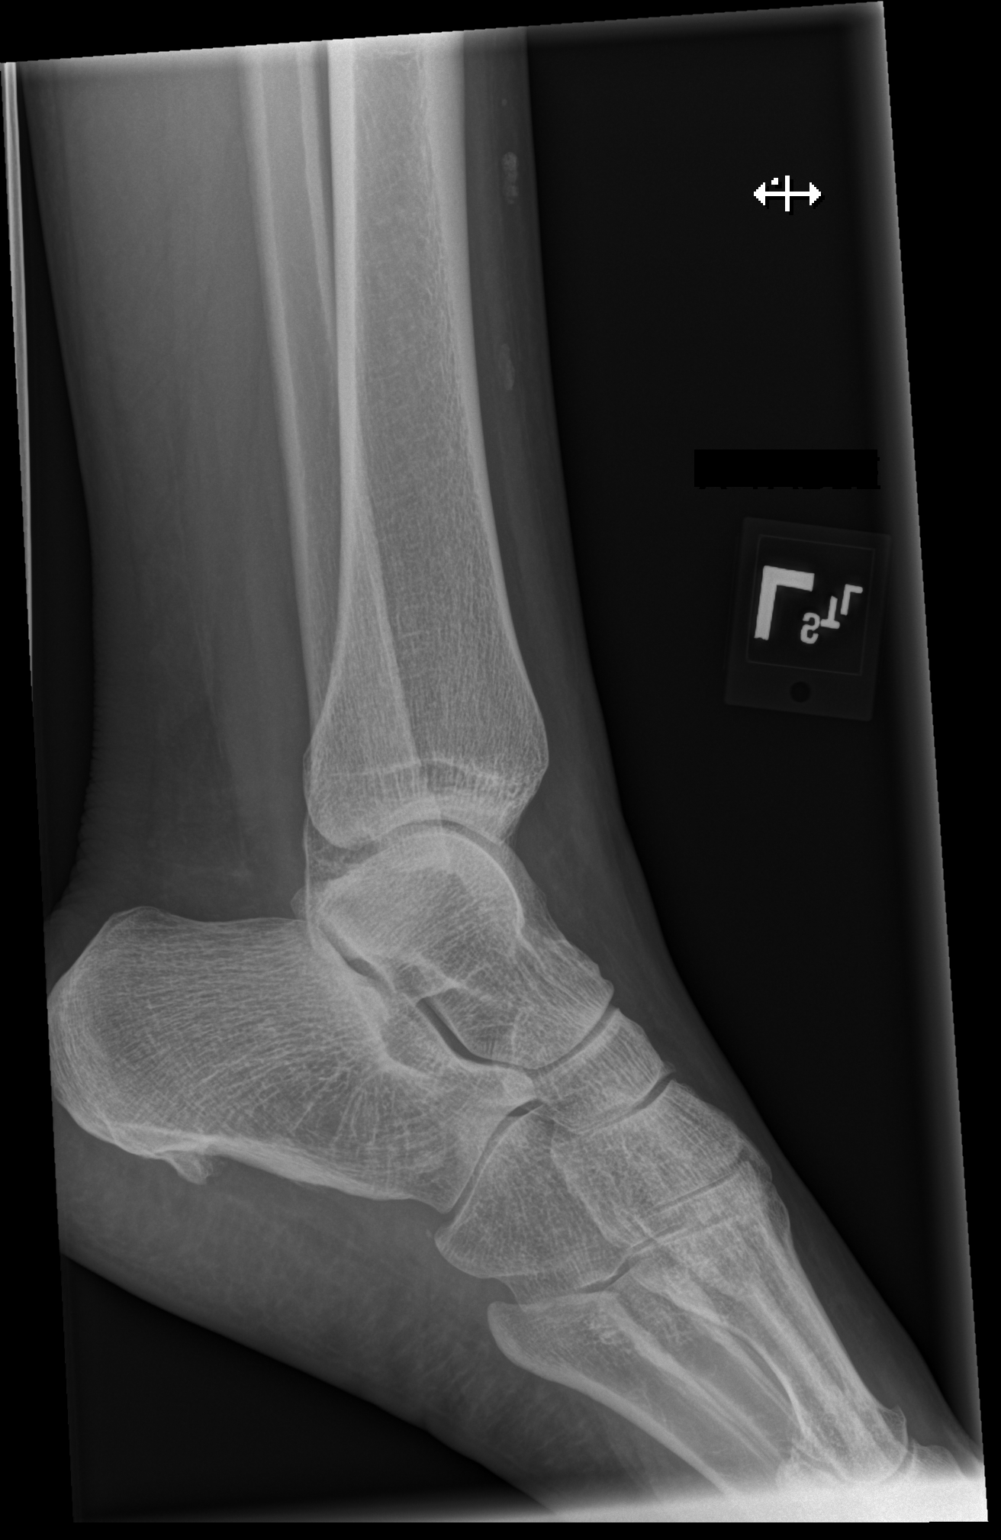

[3 of 3 positions shown; findings below may reference images not displayed]

FINDINGS: Slight lateral soft tissue swelling. No fracture, subluxation or
dislocation. Plantar calcaneal spur present.
IMPRESSION: No acute bony abnormality.

## 2018-05-09 NOTE — Telephone Encounter (Signed)
Received Home Health Certification and Plan of Care. Reviewed and signed by Dr. Irene Limbo. Faxed to [phone]. Fax confirmation received

## 2018-05-10 ENCOUNTER — Ambulatory Visit: Payer: Medicaid Other | Attending: Hematology | Admitting: Physical Therapy

## 2018-05-16 NOTE — Progress Notes (Signed)
[name]    HEMATOLOGY/ONCOLOGY CLINIC NOTE  Date of Service: 05/17/18     Patient Care Team: [name], FNP as PCP - General (Nurse Practitioner) Pixie Casino, MD as PCP - Cardiology (Cardiology)  CHIEF COMPLAINTS  F/u for continued management of metastatic lung adenocarcinoma  Diagnosis Metastatic Lung Adenocarcinoma with rt flank subcutaneous metastasis.  Current treatment:  Nivolumab q2 weeks Palliative RT to pain rt inguinal mass   PreviousTreatment -Concurrent Chemo-radiation with Carboplatin/Taxol. Completed RT on 02/13/2016 and has then completed 2 cycles of carboplatin + taxol. -Started Maintenance Durvalumab from  05/24/2016 q2weeks, switched to Nivolumab q2weeks on 12/13/16 once metastatic disease noted -s/p palliative RT to rt sided Subcutaneous metastatic mass.    HISTORY OF PRESENTING ILLNESS: Plz see my previous note for details on initial presentation.  INTERVAL HISTORY   Marilyn [name] presents to the office today for followup of her metastatic lung cancer. The patient's last visit with [place] was on 05/02/18. The pt reports that she is doing well overall.   The pt reports that home PT has been very helpful for her. She has had decreased O2 sats while ambulating, but is at 94% on room air in our clinic. She is now using a walker which has been helpful for her. The pt notes that she has had some SOB while sitting and holding a conversation.  She continues to have some right hand numbness, but notes that her swelling has reduced. She notes that the swelling under her right armpit has decreased as well, and she is able to now lift her arm above her head. She denies sensing any changes in her breasts since our last visit. She denies leg swelling.   The pt notes that she lost her medicaid coverage. She has run out of her thyroid medications and her insulin. She also endorses itchy skin and rashes.  Lab results today (05/17/18) of CBC w/diff is as follows: all  values are WNL except for RBC at 3.75, HGB at 10.8, HCT at 34.1. 05/17/18 CMP is pending  On review of systems, pt reports skin rashes, itching, lower energy levels, some SOB, improved right axillary swelling, improved right hand swelling, and denies leg swelling, and any other symptoms.   MEDICAL HISTORY:  Past Medical History:  Diagnosis Date  . Arthritis   . Asthma   . Diabetes mellitus    Type II  . Hemorrhoids   . Hypercholesteremia   . Hypertension   . Hypothyroidism   . Metastatic lung cancer (metastasis from lung to other site) (HCC)    Lung, Mets to skin- right flank. CT angio 2019 suspicious for axillary, external iliac, right inguinal mets  . 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 NICM (nonischemic cardiomyopathy) ([place])    a. mild - EF 45-50% by echo 10/2016 but EF 52% by low risk nuc 11/2016. b. Echo 07/2017 - EF normal, mild AI, unremarkable echo otherwise.  . Obese   . Pneumonia 2017  . Sinus tachycardia    a. dates back to at least 2013, etiology unclear.    SURGICAL HISTORY: Past Surgical History:  Procedure Laterality Date  . CESAREAN SECTION    . COLONOSCOPY    . I/D  arm Right 2013  . IR FLUORO GUIDE PORT INSERTION RIGHT  02/18/2017  . IR GENERIC HISTORICAL  01/08/2016   IR US GUIDE VASC ACCESS RIGHT 01/08/2016 [name], DO [facility]  . IR GENERIC HISTORICAL  01/08/2016  IR FLUORO GUIDE CV LINE RIGHT 01/08/2016 [name], DO [facility]  . IR US GUIDE VASC ACCESS RIGHT  02/18/2017  . MULTIPLE EXTRACTIONS WITH ALVEOLOPLASTY N/A 03/25/2017   Procedure: MULTIPLE EXTRACTION WITH ALVEOLOPLASTY (teeth #s four, six, seven, eight, nine, ten, eleven, one, twenty-three, twenty-four, twenty-five, twenty-six, twenty-nine, thirty);  Surgeon: [name], [name];  Location: [place];  Service: Oral Surgery;  Laterality: N/A;  . TUBAL LIGATION    . US guided core needle biopsy     of right lower neck/supraclavicular lymph nodes    SOCIAL  HISTORY: Social History   Socioeconomic History  . Marital status: Divorced    Spouse name: Not on file  . Number of children: Not on file  . Years of education: Not on file  . Highest education level: Not on file  Occupational History  . Not on file  Social Needs  . Financial resource strain: Not on file  . Food insecurity:    Worry: Not on file    Inability: Not on file  . Transportation needs:    Medical: No    Non-medical: No  Tobacco Use  . Smoking status: Never Smoker  . Smokeless tobacco: Never Used  Substance and Sexual Activity  . Alcohol use: No  . Drug use: No  . Sexual activity: Not Currently  Lifestyle  . Physical activity:    Days per week: Not on file    Minutes per session: Not on file  . Stress: Not on file  Relationships  . Social connections:    Talks on phone: Not on file    Gets together: Not on file    Attends religious service: Not on file    Active member of club or organization: Not on file    Attends meetings of clubs or organizations: Not on file    Relationship status: Not on file  . Intimate partner violence:    Fear of current or ex partner: No    Emotionally abused: No    Physically abused: No    Forced sexual activity: No  Other Topics Concern  . Not on file  Social History Narrative   No bird or mold exposure. No recent travel.   11-23-17 Unable to ask abuse questions mother with her today.    FAMILY HISTORY: Family History  Problem Relation Age of Onset  . Heart failure Mother   . Hypertension Mother   . Cancer Neg Hx   . Rheumatologic disease Neg Hx     ALLERGIES:  has No Known Allergies.  MEDICATIONS:  Current Outpatient Medications  Medication Sig Dispense Refill  . ASPIRIN LOW DOSE 81 MG EC tablet TAKE 1 TABLET BY MOUTH EVERY DAY (Patient taking differently: Take 81 mg by mouth daily. Enteric Coated.  ** DO NOT CRUSH **) 60 tablet 2  . atorvastatin (LIPITOR) 40 MG tablet Take 1 tablet (40 mg total) by mouth daily  at 6 PM. 30 tablet 0  . DULoxetine (CYMBALTA) 60 MG capsule TAKE 1 CAPSULE BY MOUTH EVERY DAY (Patient taking differently: Take 60 mg by mouth daily. ) 30 capsule 2  . fluconazole (DIFLUCAN) 200 MG tablet Take 1 tablet (200 mg total) by mouth daily. 1 tablet 0  . hydrOXYzine (ATARAX/VISTARIL) 10 MG tablet Take 1 tablet (10 mg total) by mouth 3 (three) times daily as needed for itching. 30 tablet 0  . insulin aspart (NOVOLOG) 100 UNIT/ML FlexPen As per sliding scale instructions (Patient taking differently: Inject 0-10 Units into the  skin 3 (three) times daily. As per sliding scale instructions) 15 mL 2  . Insulin Degludec (TRESIBA FLEXTOUCH) 200 UNIT/ML SOPN Inject 100 Units into the skin daily before breakfast.     . levalbuterol (XOPENEX HFA) 45 MCG/ACT inhaler Inhale 1-2 puffs into the lungs every 8 (eight) hours as needed for wheezing. 1 Inhaler 12  . levothyroxine (SYNTHROID, LEVOTHROID) 150 MCG tablet TAKE 1 TABLET BY MOUTH EVERY DAY BEFORE BREAKFAST (Patient taking differently: Take 150 mcg by mouth daily before breakfast. ) 30 tablet 2  . lidocaine-prilocaine (EMLA) cream Apply 1 application topically as needed. 30 g 6  . metoprolol succinate (TOPROL-XL) 50 MG 24 hr tablet Take 1 tablet (86m) by mouth in the morning and 1/2 tablet (234m in the evening. Take with or immediately following a meal. (Patient taking differently: Take 25-50 mg by mouth See admin instructions. Take 1 tablet (5051mby mouth in the morning and 1/2 tablet (68m47mn the evening. Take with or immediately following a meal.) 135 tablet 3  . naproxen (NAPROSYN) 500 MG tablet Take 1 tablet (500 mg total) by mouth 2 (two) times daily with a meal. 20 tablet 0  . nitrofurantoin, macrocrystal-monohydrate, (MACROBID) 100 MG capsule Take 1 capsule (100 mg total) by mouth every 12 (twelve) hours. 8 capsule 0  . oxyCODONE-acetaminophen (PERCOCET) 5-325 MG tablet Take 1 tablet by mouth every 6 (six) hours as needed for moderate pain or  severe pain. 50 tablet 0  . pregabalin (LYRICA) 50 MG capsule Take 50 mg by mouth 3 (three) times daily.    . seMarland Kitchenna-docusate (SENNA S) 8.6-50 MG tablet Take 2 tablets by mouth 2 (two) times daily. May reduce to 2 tab po HS as needed once regular BM estabished (Patient taking differently: Take 2 tablets by mouth 2 (two) times daily as needed (FOR CONSTIPATION). =) 60 tablet 1  . traZODone (DESYREL) 50 MG tablet Take 1-2 tablets (50-100 mg total) by mouth at bedtime as needed for sleep. 60 tablet 0  . Vitamins A & D (VITAMIN A & D) OINT Apply 1 application topically 2 (two) times daily. 1 Tube 3   No current facility-administered medications for this visit.    Facility-Administered Medications Ordered in Other Visits  Medication Dose Route Frequency Provider Last Rate Last Dose  . sodium chloride 0.9 % injection 10 mL  10 mL Intravenous PRN KaleBrunetta Genera   10 mL at 05/24/16 1252    REVIEW OF SYSTEMS:    A 10+ POINT REVIEW OF SYSTEMS WAS OBTAINED including neurology, dermatology, psychiatry, cardiac, respiratory, lymph, extremities, GI, GU, Musculoskeletal, constitutional, breasts, reproductive, HEENT.  All pertinent positives are noted in the HPI.  All others are negative.   PHYSICAL EXAMINATION: ECOG PERFORMANCE STATUS: 1 - Symptomatic but completely ambulatory  Vitals:   05/17/18 1342  BP: (!) 135/97  Pulse: (!) 124  Resp: 18  Temp: (!) 97.5 F (36.4 C)  SpO2: 94%   Filed Weights   05/17/18 1342  Weight: 285 lb 1.6 oz (129.3 kg)   .Body mass index is 42.1 kg/m.   GENERAL:alert, in no acute distress and comfortable SKIN: no acute rashes, no significant lesions EYES: conjunctiva are pink and non-injected, sclera anicteric OROPHARYNX: MMM, no exudates, no oropharyngeal erythema or ulceration NECK: supple, no JVD LYMPH:  no palpable lymphadenopathy in the cervical, axillary or inguinal regions LUNGS: clear to auscultation b/l with normal respiratory effort HEART:  regular rate & rhythm ABDOMEN:  normoactive bowel sounds , non tender,  not distended. No palpable hepatosplenomegaly.  Extremity: no pedal edema PSYCH: alert & oriented x 3 with fluent speech NEURO: no focal motor/sensory deficits   LABORATORY DATA: .  [name] CBC Latest Ref Rng & Units 05/17/2018 05/02/2018 04/28/2018  WBC 4.0 - 10.5 K/uL 4.8 6.3 5.9  Hemoglobin 12.0 - 15.0 g/dL 10.8(L) 9.4(L) 9.1(L)  Hematocrit 36.0 - 46.0 % 34.1(L) 29.4(L) 29.7(L)  Platelets 150 - 400 K/uL 263 264 246    . CMP Latest Ref Rng & Units 05/17/2018 05/02/2018 04/30/2018  Glucose 70 - 99 mg/dL 372(H) 325(H) 125(H)  BUN 6 - 20 mg/dL _0 Creatinine 0.44 - 1.00 mg/dL 0.91 0.84 0.64  Sodium 135 - 145 mmol/L 138 139 139  Potassium 3.5 - 5.1 mmol/L 3.8 3.8 3.9  Chloride 98 - 111 mmol/L 100 104 104  CO2 22 - 32 mmol/L _1 Calcium 8.9 - 10.3 mg/dL 8.5(L) 8.8(L) 8.8(L)  Total Protein 6.5 - 8.1 g/dL 7.5 7.3 -  Total Bilirubin 0.3 - 1.2 mg/dL 0.3 0.2(L) -  Alkaline Phos 38 - 126 U/L 85 102 -  AST 15 - 41 U/L 12(L) 12(L) -  ALT 0 - 44 U/L 7 9 -               08/18/17 Bx:   08/16/17 Molecular Pathology:    RADIOGRAPHIC STUDIES:  .Mr Jeri Cos Wo Contrast  Result Date: 04/29/2018 CLINICAL DATA:  Left arm weakness, right facial numbness. Metastatic lung cancer. EXAM: MRI HEAD WITHOUT AND WITH CONTRAST MRA HEAD WITHOUT CONTRAST TECHNIQUE: Multiplanar, multiecho pulse sequences of the brain and surrounding structures were obtained without and with intravenous contrast. Angiographic images of the head were obtained using MRA technique without contrast. CONTRAST:  10 mL Gadavist COMPARISON:  None. FINDINGS: MRI HEAD FINDINGS BRAIN: There is no acute infarct, acute hemorrhage or mass effect. The midline structures are normal. There are no old infarcts. The white matter signal is normal for the patient's age. The CSF spaces are normal for age, with no hydrocephalus. Susceptibility-sensitive sequences show no  chronic microhemorrhage or superficial siderosis. SKULL AND UPPER CERVICAL SPINE: The visualized skull base, calvarium, upper cervical spine and extracranial soft tissues are normal. SINUSES/ORBITS: No fluid levels or advanced mucosal thickening. No mastoid or middle ear effusion. The orbits are normal. MRA HEAD FINDINGS POSTERIOR CIRCULATION: --Basilar artery: Normal. --Posterior cerebral arteries: Normal.  Bilateral fetal origins. --Superior cerebellar arteries: Normal. --Inferior cerebellar arteries: Normal posterior inferior cerebellar arteries. Anterior inferior cerebellar arteries are not clearly visible, but this is not uncommon. ANTERIOR CIRCULATION: --Intracranial internal carotid arteries: Normal. --Anterior cerebral arteries: Normal. Both A1 segments are present. Patent anterior communicating artery. --Middle cerebral arteries: Normal. --Posterior communicating arteries: Present bilaterally. IMPRESSION: 1. Normal MRI of the brain. 2. Normal intracranial MRA. Electronically Signed   By: Ulyses Jarred M.D.   On: 04/29/2018 06:03   Mr Cervical Spine W Wo Contrast  Result Date: 04/29/2018 CLINICAL DATA:  Left arm weakness and numbness. Metastatic lung cancer. EXAM: MRI CERVICAL SPINE WITHOUT AND WITH CONTRAST TECHNIQUE: Multiplanar and multiecho pulse sequences of the cervical spine, to include the craniocervical junction and cervicothoracic junction, were obtained without and with intravenous contrast. CONTRAST:  10 mL Gadavist COMPARISON:  None. FINDINGS: Alignment: Normal. Vertebrae: No focal marrow lesion. No compression fracture or evidence of discitis osteomyelitis. Cord: Normal caliber and signal. Posterior Fossa, vertebral arteries, paraspinal tissues: Visualized posterior fossa is normal. Vertebral artery flow voids are preserved. No prevertebral effusion. Disc levels:  Sagittal imaging includes the atlantoaxial joint to the level of the T3-4 disc space, with axial imaging of the disc spaces from  C2-3 to C7-T1. Atlantoaxial articulation is normal. C2-3: Normal. C3-4: Normal. C4-5: Normal. C5-6: Small central disc protrusion with mild spinal canal stenosis. No neural foraminal stenosis. C6-7: Left subarticular disc protrusion causes mild spinal canal stenosis and moderate left neural foraminal stenosis. Right neural foramen is patent. C7-T1: Normal. Sagittal imaging of the T1-T4 levels is normal. IMPRESSION: 1. No cervical metastatic disease. 2. 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 3. Unchanged moderate left neural foraminal stenosis at C6-7. Electronically Signed   By: Ulyses Jarred M.D.   On: 04/29/2018 06:09   Mr Jodene Nam Head Wo Contrast  Result Date: 04/29/2018 CLINICAL DATA:  Left arm weakness, right facial numbness. Metastatic lung cancer. EXAM: MRI HEAD WITHOUT AND WITH CONTRAST MRA HEAD WITHOUT CONTRAST TECHNIQUE: Multiplanar, multiecho pulse sequences of the brain and surrounding structures were obtained without and with intravenous contrast. Angiographic images of the head were obtained using MRA technique without contrast. CONTRAST:  10 mL Gadavist COMPARISON:  None. FINDINGS: MRI HEAD FINDINGS BRAIN: There is no acute infarct, acute hemorrhage or mass effect. The midline structures are normal. There are no old infarcts. The white matter signal is normal for the patient's age. The CSF spaces are normal for age, with no hydrocephalus. Susceptibility-sensitive sequences show no chronic microhemorrhage or superficial siderosis. SKULL AND UPPER CERVICAL SPINE: The visualized skull base, calvarium, upper cervical spine and extracranial soft tissues are normal. SINUSES/ORBITS: No fluid levels or advanced mucosal thickening. No mastoid or middle ear effusion. The orbits are normal. MRA HEAD FINDINGS POSTERIOR CIRCULATION: --Basilar artery: Normal. --Posterior cerebral arteries: Normal.  Bilateral fetal origins. --Superior cerebellar arteries: Normal. --Inferior cerebellar arteries:  Normal posterior inferior cerebellar arteries. Anterior inferior cerebellar arteries are not clearly visible, but this is not uncommon. ANTERIOR CIRCULATION: --Intracranial internal carotid arteries: Normal. --Anterior cerebral arteries: Normal. Both A1 segments are present. Patent anterior communicating artery. --Middle cerebral arteries: Normal. --Posterior communicating arteries: Present bilaterally. IMPRESSION: 1. Normal MRI of the brain. 2. Normal intracranial MRA. Electronically Signed   By: Ulyses Jarred M.D.   On: 04/29/2018 06:03   Ct Head Code Stroke Wo Contrast  Result Date: 04/28/2018 CLINICAL DATA:  Code stroke. Initial evaluation for acute right-sided numbness and weakness. EXAM: CT HEAD WITHOUT CONTRAST TECHNIQUE: Contiguous axial images were obtained from the base of the skull through the vertex without intravenous contrast. COMPARISON:  Prior MRI from 11/19/2016 FINDINGS: Brain: Cerebral volume within normal limits for patient age. No evidence for acute intracranial hemorrhage. No findings to suggest acute large vessel territory infarct. No mass lesion, midline shift, or mass effect. Ventricles are normal in size without evidence for hydrocephalus. No extra-axial fluid collection identified. Vascular: No hyperdense vessel identified. Skull: Scalp soft tissues demonstrate no acute abnormality. Calvarium intact. Sinuses/Orbits: Globes and orbital soft tissues within normal limits. Visualized paranasal sinuses are clear. No mastoid effusion. ASPECTS [facility] Stroke Program Early CT Score) - Ganglionic level infarction (caudate, lentiform nuclei, internal capsule, insula, M1-M3 cortex): 7 - Supraganglionic infarction (M4-M6 cortex): 3 Total score (0-10 with 10 being normal): 10 IMPRESSION: 1. Negative head CT.  No acute intracranial abnormality identified 2. ASPECTS is 10. Critical Value/emergent results were called by telephone at the time of interpretation on 04/28/2018 at 10:38 pm to Dr. Julianne Rice , who verbally acknowledged these results. Electronically Signed   By: Pincus Badder.D.  On: 04/28/2018 22:40   Vas US Carotid (at [facility] Only)  Result Date: 04/29/2018 Carotid Arterial Duplex Study Indications:  CVA. Risk Factors: Hypertension, hyperlipidemia, Diabetes. Performing Technologist: Maudry Mayhew MHA, RDMS, RVT, RDCS  Examination Guidelines: A complete evaluation includes B-mode imaging, spectral Doppler, color Doppler, and power Doppler as needed of all accessible portions of each vessel. Bilateral testing is considered an integral part of a complete examination. Limited examinations for reoccurring indications may be performed as noted.  Right Carotid Findings: +----------+--------+--------+--------+-----------------------+--------+           PSV cm/sEDV cm/sStenosisDescribe               Comments +----------+--------+--------+--------+-----------------------+--------+ CCA Prox  73      16                                              +----------+--------+--------+--------+-----------------------+--------+ CCA Distal58      24              smooth and heterogenous         +----------+--------+--------+--------+-----------------------+--------+ ICA Prox  58      23              smooth and heterogenous         +----------+--------+--------+--------+-----------------------+--------+ ICA Distal63      23                                              +----------+--------+--------+--------+-----------------------+--------+ ECA       71      12                                              +----------+--------+--------+--------+-----------------------+--------+ +----------+--------+-------+--------------+-------------------+           PSV cm/sEDV cmsDescribe      Arm Pressure (mmHG) +----------+--------+-------+--------------+-------------------+ Subclavian               Not identified                     +----------+--------+-------+--------------+-------------------+ +---------+--------+--------+-------------------+ VertebralPSV cm/sEDV cm/sUnable to visualize +---------+--------+--------+-------------------+ Anechoic area in the right lateral neck (area of pain) measuring 2.1cm. Suggestive of possible lymph node versus unknown etiology.  Left Carotid Findings: +----------+--------+-------+--------+----------------------+------------------+           PSV cm/sEDV    StenosisDescribe              Comments                             cm/s                                                    +----------+--------+-------+--------+----------------------+------------------+ CCA Prox  119     31                                                      +----------+--------+-------+--------+----------------------+------------------+  CCA Distal69      22                                   intimal thickening +----------+--------+-------+--------+----------------------+------------------+ ICA Prox  121     39             smooth and                                                                heterogenous                             +----------+--------+-------+--------+----------------------+------------------+ ICA Distal49      21                                                      +----------+--------+-------+--------+----------------------+------------------+ ECA       91      19                                                      +----------+--------+-------+--------+----------------------+------------------+ +----------+--------+--------+----------------+-------------------+ SubclavianPSV cm/sEDV cm/sDescribe        Arm Pressure (mmHG) +----------+--------+--------+----------------+-------------------+           55              Multiphasic, WNL                    +----------+--------+--------+----------------+-------------------+  +---------+--------+--+--------+--+---------+ VertebralPSV cm/s31EDV cm/s11Antegrade +---------+--------+--+--------+--+---------+ There are multiple hypoechoic heterogenous areas in the left clavicular area, suggestive of possible lymph node versus unknown etiology.  Summary: Right Carotid: Velocities in the right ICA are consistent with a 1-39% stenosis. Left Carotid: Velocities in the left ICA are consistent with a 1-39% stenosis. Vertebrals:  Left vertebral artery demonstrates antegrade flow. Right vertebral              artery was not visualized. Subclavians: Right subclavian artery was not visualized. Normal flow              hemodynamics were seen in the left subclavian artery. *See table(s) above for measurements and observations.  Electronically signed by Servando Snare MD on 04/29/2018 at 1:30:01 PM.    Final    Vas [place] Upper Extremity Venous Duplex  Result Date: 05/03/2018 UPPER VENOUS STUDY  Indications: Pain Performing Technologist: Oliver Hum RVT  Examination Guidelines: A complete evaluation includes B-mode imaging, spectral Doppler, color Doppler, and power Doppler as needed of all accessible portions of each vessel. Bilateral testing is considered an integral part of a complete examination. Limited examinations for reoccurring indications may be performed as noted.  Right Findings: +----------+------------+----------+---------+-----------+-------+ RIGHT     CompressiblePropertiesPhasicitySpontaneousSummary +----------+------------+----------+---------+-----------+-------+ IJV           Full                 Yes  Yes            +----------+------------+----------+---------+-----------+-------+ Subclavian    Full                 Yes       Yes            +----------+------------+----------+---------+-----------+-------+ Axillary      Full                 Yes       Yes            +----------+------------+----------+---------+-----------+-------+ Brachial       Full                 Yes       Yes            +----------+------------+----------+---------+-----------+-------+ Radial        Full                                          +----------+------------+----------+---------+-----------+-------+ Ulnar         Full                                          +----------+------------+----------+---------+-----------+-------+ Cephalic      Full                                          +----------+------------+----------+---------+-----------+-------+ Basilic       Full                                          +----------+------------+----------+---------+-----------+-------+  Left Findings: +----------+------------+----------+---------+-----------+-------+ LEFT      CompressiblePropertiesPhasicitySpontaneousSummary +----------+------------+----------+---------+-----------+-------+ IJV           Full                 Yes       Yes            +----------+------------+----------+---------+-----------+-------+ Subclavian    Full                 Yes       Yes            +----------+------------+----------+---------+-----------+-------+ Axillary      Full                 Yes       Yes            +----------+------------+----------+---------+-----------+-------+ Brachial      Full                 Yes       Yes            +----------+------------+----------+---------+-----------+-------+ Radial        Full                                          +----------+------------+----------+---------+-----------+-------+ Ulnar         Full                                          +----------+------------+----------+---------+-----------+-------+  Cephalic      Full                                          +----------+------------+----------+---------+-----------+-------+ Basilic       Full                                          +----------+------------+----------+---------+-----------+-------+   Summary:  Right: No evidence of deep vein thrombosis in the upper extremity. No evidence of superficial vein thrombosis in the upper extremity.  Left: No evidence of deep vein thrombosis in the upper extremity. No evidence of superficial vein thrombosis in the upper extremity.  *See table(s) above for measurements and observations.  Diagnosing physician: Monica Martinez MD Electronically signed by Monica Martinez MD on 05/03/2018 at 4:00:25 PM.    Final      ASSESSMENT & PLAN:   53 y.o.  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  -Noted to have a small mass in the left adrenal on CT but PET/CT neg for metastatic disease. Patient has been a lifelong nonsmoker -MRI of the brain was negative for any metastatic disease. At diagnosis. -High PDL1 expression (90%) on foundation One Neg for EGFR, ALK, ROS-1 and BRAF mutations. -Patient completed her planned definitive chemoradiation with Carbo Taxol on 02/13/2016. No prohibitive toxicities other than some grade 1 skin desquamation and some grade 1-2 radiation esophagitis. She has subsequently received 2 out of 2 planned dose of carboplatin + Taxol. -CTA chest 10/29/2016  no evidence of lung cancer progression in the chest. No PE -CT abd/pelvis-10/29/2016  Interval 5.0 x 5.0 x 4.7 cm mass in the right lateral subcutaneous fat at the level of the mid abdomen. This has CT features compatible with a metastasis or primary neoplasm. -CT C/A/P (05/12/2017): Interval development of right axillary, right external iliac and right inguinal adenopathy. Suspicious for nodal metastasis. 2. Decrease in size of right paratracheal adenopathy. 3. Decrease  in size and enhancement associated with right lateral body wall lesion. 4. New small nonspecific pulmonary nodule in the left lower lobe measuring 6 mm. 5. Stable appearance of changes secondary to external beam radiation within the right lung  08/03/17 PET which revealed 2.8 cm right axillary lymph node is markedly hypermetabolic and consistent with metastatic disease. Extensive radiation changes involving the right paramediastinal lung. There is a focus of hypermetabolism in the right lower lobe which is suspicious for residual or recurrent tumor.  11/11/17 CT C/A/P revealed There has been interval increase in number of enlarged of right axillary nodes and increase in size of right inguinal adenopathy compatible with progression of disease. Stable appearance of the right lung with changes of external beam radiation.    03/01/18 CT C/A/P revealed Bulky right subpectoral/axillary adenopathy, markedly progressive from 11/11/2017. Enlarging retrocrural lymph node. Low right paratracheal adenopathy is stable. 2. Interval resection of a right inguinal nodal mass. Stable right external iliac lymph node. 3. Cholelithiasis.  Stable common bile duct dilatation. 4.  Aortic atherosclerosis.  she received maintenance  Durvalumab from  05/24/2016 q2weeks, switched to Nivolumab q2weeks on 12/13/16 in the setting of Biopsy proven isolated metastatic disease. Given the tumor is strongly PDL1 positive and patient has only isolated metastatic disease was treated with palliative RT to metastases with significant improvement. Molecular pathology from 515/19 did not reveal obviously targetable mutations with NF1 and TP53 mutations present.  Plan:  -Discussed pt labwork today, 05/17/18; HGB improved to 19.7 -Folliculitis from high blood sugars, keep well moisturized, and follow up with social work to [employer] for help purchasing insulin and thyroid medications -Discussed that I recommend prioritizing obtaining insurance  coverage for optimization of her DM and hypothyroidism -The pt has no prohibitive toxicities from continuing Nivolumab every 2 weeks, at this time.  -Home O2, and Social work referral -Discussed my concerns for her right arm and breast swelling as: Radiation related inflammation vs lymphedema and tumor growth -1/29/20US Venous of RIGHT Upper Extremity - neg for DVT -Will order CT Chest again to further evaluate lymph nodes and possible involvement -Recommend pt keep her arm elevated and stressed the importance of PT as well  -Vitamin A&D ointment and Hydorxyzine for vaginal itching  -Advised that pt continue follow up with her cardiologist -Advised patient to follow up with her PCP regarding her Lyrica dose. -Will see the pt back in 2 weeks  #3 h/o radiation pneumonitis - no new symptoms. Does has some cough with exertion but this has improved.  #4 Improving fatigue (grade 1) - improved fatigue with levothyroxine compliance with near normalization of FT4 levels.  - previously given TSH of 12 -- increased Levolthyroxine from 118mg to 1513m  ---will likely need larger dose but in the setting of baseline tachycardia we are gradually increasing dose to avoid ppt cardiac arrhythmias.  -TSH 11/14/2017 WNL.  #5 normocytic anemia related to chemotherapy -resolving and stable  #6 Left chest wall pain due to costochondritis - mx with pain medications. CTA x 3 neg for PE. No significant pain at this time.  #7 Sinus tachycardia - on BB per cards , no PE on CTA x 3 , -Still having sinus tachycardia . Partly from deconditioning and anemia. Seeing Dr. HiDebara Pickettrom cardiology  -Continues to be on BB  #8 neoplasm related pain is currently controlled without significant medications. Clinically likely has sleep apnea . Would need to be careful with sedative medications . 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 The patient knows to call the clinic with any problems, questions or concerns.  The total time spent in the appt was 25 minutes and more than 50% was on counseling and direct patient cares.   GautSullivan LoneMS AFolsomIVMS SCH [facility] Center[facility]/Oncology Physician [facility]ice):       [phone] cell):  [phone]):           [phone] SchuBaldwin [place] acting as a scribe for Dr. GautSullivan Lone.I have reviewed the above documentation  for accuracy and completeness, and I agree with the above. [name] MD

## 2018-05-17 ENCOUNTER — Encounter: Payer: Self-pay | Admitting: General Practice

## 2018-05-17 ENCOUNTER — Inpatient Hospital Stay: Payer: Medicaid Other

## 2018-05-17 ENCOUNTER — Other Ambulatory Visit: Payer: Self-pay | Admitting: General Practice

## 2018-05-17 ENCOUNTER — Inpatient Hospital Stay: Payer: Medicaid Other | Attending: Hematology | Admitting: Hematology

## 2018-05-17 VITALS — BP 130/92 | HR 119 | Temp 98.1°F | Resp 20

## 2018-05-17 VITALS — BP 135/97 | HR 124 | Temp 97.5°F | Resp 18 | Ht 69.0 in | Wt 285.1 lb

## 2018-05-17 DIAGNOSIS — C3490 Malignant neoplasm of unspecified part of unspecified bronchus or lung: Secondary | ICD-10-CM

## 2018-05-17 DIAGNOSIS — C7802 Secondary malignant neoplasm of left lung: Secondary | ICD-10-CM | POA: Insufficient documentation

## 2018-05-17 DIAGNOSIS — G893 Neoplasm related pain (acute) (chronic): Secondary | ICD-10-CM | POA: Diagnosis not present

## 2018-05-17 DIAGNOSIS — C7989 Secondary malignant neoplasm of other specified sites: Secondary | ICD-10-CM | POA: Insufficient documentation

## 2018-05-17 DIAGNOSIS — I10 Essential (primary) hypertension: Secondary | ICD-10-CM | POA: Diagnosis not present

## 2018-05-17 DIAGNOSIS — C3491 Malignant neoplasm of unspecified part of right bronchus or lung: Secondary | ICD-10-CM

## 2018-05-17 DIAGNOSIS — E039 Hypothyroidism, unspecified: Secondary | ICD-10-CM | POA: Diagnosis not present

## 2018-05-17 DIAGNOSIS — G4700 Insomnia, unspecified: Secondary | ICD-10-CM | POA: Diagnosis not present

## 2018-05-17 DIAGNOSIS — Z95828 Presence of other vascular implants and grafts: Secondary | ICD-10-CM

## 2018-05-17 DIAGNOSIS — Z79899 Other long term (current) drug therapy: Secondary | ICD-10-CM | POA: Insufficient documentation

## 2018-05-17 DIAGNOSIS — Z791 Long term (current) use of non-steroidal anti-inflammatories (NSAID): Secondary | ICD-10-CM | POA: Insufficient documentation

## 2018-05-17 DIAGNOSIS — Z794 Long term (current) use of insulin: Secondary | ICD-10-CM | POA: Insufficient documentation

## 2018-05-17 DIAGNOSIS — R21 Rash and other nonspecific skin eruption: Secondary | ICD-10-CM | POA: Insufficient documentation

## 2018-05-17 DIAGNOSIS — Z7189 Other specified counseling: Secondary | ICD-10-CM

## 2018-05-17 DIAGNOSIS — D6481 Anemia due to antineoplastic chemotherapy: Secondary | ICD-10-CM | POA: Diagnosis not present

## 2018-05-17 DIAGNOSIS — E1140 Type 2 diabetes mellitus with diabetic neuropathy, unspecified: Secondary | ICD-10-CM

## 2018-05-17 DIAGNOSIS — Z5112 Encounter for antineoplastic immunotherapy: Secondary | ICD-10-CM | POA: Insufficient documentation

## 2018-05-17 DIAGNOSIS — C792 Secondary malignant neoplasm of skin: Secondary | ICD-10-CM

## 2018-05-17 DIAGNOSIS — R000 Tachycardia, unspecified: Secondary | ICD-10-CM

## 2018-05-17 DIAGNOSIS — R0602 Shortness of breath: Secondary | ICD-10-CM

## 2018-05-17 LAB — CBC WITH DIFFERENTIAL/PLATELET
Abs Immature Granulocytes: 0.02 10*3/uL (ref 0.00–0.07)
Basophils Absolute: 0 10*3/uL (ref 0.0–0.1)
Basophils Relative: 0 %
EOS PCT: 6 %
Eosinophils Absolute: 0.3 10*3/uL (ref 0.0–0.5)
HCT: 34.1 % — ABNORMAL LOW (ref 36.0–46.0)
Hemoglobin: 10.8 g/dL — ABNORMAL LOW (ref 12.0–15.0)
Immature Granulocytes: 0 %
LYMPHS ABS: 0.7 10*3/uL (ref 0.7–4.0)
Lymphocytes Relative: 16 %
MCH: 28.8 pg (ref 26.0–34.0)
MCHC: 31.7 g/dL (ref 30.0–36.0)
MCV: 90.9 fL (ref 80.0–100.0)
Monocytes Absolute: 0.3 10*3/uL (ref 0.1–1.0)
Monocytes Relative: 6 %
Neutro Abs: 3.4 10*3/uL (ref 1.7–7.7)
Neutrophils Relative %: 72 %
Platelets: 263 10*3/uL (ref 150–400)
RBC: 3.75 MIL/uL — ABNORMAL LOW (ref 3.87–5.11)
RDW: 13.2 % (ref 11.5–15.5)
WBC: 4.8 10*3/uL (ref 4.0–10.5)
nRBC: 0 % (ref 0.0–0.2)

## 2018-05-17 LAB — CMP (CANCER CENTER ONLY)
ALBUMIN: 3.1 g/dL — AB (ref 3.5–5.0)
ALT: 7 U/L (ref 0–44)
AST: 12 U/L — AB (ref 15–41)
Alkaline Phosphatase: 85 U/L (ref 38–126)
Anion gap: 10 (ref 5–15)
BUN: 7 mg/dL (ref 6–20)
CO2: 28 mmol/L (ref 22–32)
CREATININE: 0.91 mg/dL (ref 0.44–1.00)
Calcium: 8.5 mg/dL — ABNORMAL LOW (ref 8.9–10.3)
Chloride: 100 mmol/L (ref 98–111)
GFR, Est AFR Am: >60 mL/min (ref >60)
GFR, Estimated: >60 mL/min (ref >60)
Glucose, Bld: 372 mg/dL — ABNORMAL HIGH (ref 70–99)
Potassium: 3.8 mmol/L (ref 3.5–5.1)
Sodium: 138 mmol/L (ref 135–145)
Total Bilirubin: 0.3 mg/dL (ref 0.3–1.2)
Total Protein: 7.5 g/dL (ref 6.5–8.1)

## 2018-05-17 MED ORDER — SODIUM CHLORIDE 0.9 % IV SOLN
240.0000 mg | Freq: Once | INTRAVENOUS | Status: AC
  Administered 2018-05-17: 240 mg via INTRAVENOUS
  Filled 2018-05-17: qty 24

## 2018-05-17 MED ORDER — ALTEPLASE 2 MG IJ SOLR
INTRAMUSCULAR | Status: AC
  Filled 2018-05-17: qty 2

## 2018-05-17 MED ORDER — ALTEPLASE 2 MG IJ SOLR
2.0000 mg | Freq: Once | INTRAMUSCULAR | Status: AC
  Administered 2018-05-17: 2 mg
  Filled 2018-05-17: qty 2

## 2018-05-17 MED ORDER — SODIUM CHLORIDE 0.9 % IV SOLN
Freq: Once | INTRAVENOUS | Status: AC
  Administered 2018-05-17: 16:00:00 via INTRAVENOUS
  Filled 2018-05-17: qty 250

## 2018-05-17 MED ORDER — HEPARIN SOD (PORK) LOCK FLUSH 100 UNIT/ML IV SOLN
500.0000 [IU] | Freq: Once | INTRAVENOUS | Status: AC | PRN
  Administered 2018-05-17: 500 [IU]
  Filled 2018-05-17: qty 5

## 2018-05-17 MED ORDER — SODIUM CHLORIDE 0.9% FLUSH
10.0000 mL | INTRAVENOUS | Status: DC | PRN
  Administered 2018-05-17: 10 mL
  Filled 2018-05-17: qty 10

## 2018-05-17 NOTE — Progress Notes (Signed)
Per Dr. Irene Limbo: OK to treat with elevated HR  Unable to get blood return from [facility] after several minutes of port-aerobics. CathFlow order placed and administered at 15:08. Will continue to monitor  Blood return obtained at 15:39. Orders released.

## 2018-05-17 NOTE — Patient Instructions (Signed)
[facility] Discharge Instructions for Patients Receiving Chemotherapy  Today you received the following chemotherapy agents: Nivolumab (Opdivo)  To help prevent nausea and vomiting after your treatment, we encourage you to take your nausea medication as directed.   If you develop nausea and vomiting that is not controlled by your nausea medication, call the clinic.   BELOW ARE SYMPTOMS THAT SHOULD BE REPORTED IMMEDIATELY:  *FEVER GREATER THAN 100.5 F  *CHILLS WITH OR WITHOUT FEVER  NAUSEA AND VOMITING THAT IS NOT CONTROLLED WITH YOUR NAUSEA MEDICATION  *UNUSUAL SHORTNESS OF BREATH  *UNUSUAL BRUISING OR BLEEDING  TENDERNESS IN MOUTH AND THROAT WITH OR WITHOUT PRESENCE OF ULCERS  *URINARY PROBLEMS  *BOWEL PROBLEMS  UNUSUAL RASH Items with * indicate a potential emergency and should be followed up as soon as possible.  Feel free to call the clinic should you have any questions or concerns. The clinic phone number is [phone].  Please show the CHEMO ALERT CARD at check-in to the Emergency Department and triage nurse.  

## 2018-05-17 NOTE — Progress Notes (Signed)
Marilyn [name] CSW Progress Notes  Met w patient in infusion at request of MD and [name] RN.  Concerned that patient does not have access to needed medications as she has recently lost her health insurance.  Per patient, she receives SSDI income which is current.  She did receive Medicaid until recently when she was notified that she did not meet current criteria for Medicaid financial eligibility.  Reapplied for Medicaid in late January after addressing issues related to financial eligibility.  Is awaiting decision from DSS.  Was seeing PCP Bayard Hugger NP at [facility] on [address] in [place] but has not returned since her insurance lapsed.  Discussed w patient option of transferring care to PCP that has case management capabilities which can assist w access to medications.  Patient lives near three such practices including [facility], [facility] and [facility] [employer].  Contact information provided for all the above, patient states she will call these providers tomorrow and schedule appointment.  CSW also provided information on financial assistance available through [facility] and [facility], both of which provide small grants to patients diagnosed w cancer.    Edwyna Shell, LCSW Clinical Social Worker Phone:  [phone]

## 2018-05-18 ENCOUNTER — Telehealth: Payer: Self-pay | Admitting: Hematology

## 2018-05-18 ENCOUNTER — Encounter: Payer: Self-pay | Admitting: Pharmacy Technician

## 2018-05-18 NOTE — Telephone Encounter (Signed)
Called patient and scheduled appt per 02/12 los.  Per MD it was okay to move patient treatments back to Monday.  Patient aware of appt date and time.  Gave patient the number to central radiology to schedule the CT scan before her next MD visit.

## 2018-05-18 NOTE — Progress Notes (Signed)
The patient is approved for drug assistance by [employer] for Opdivo.  Enrollment is effective until 05/19/19 and is based on self pay.  Drug replacement will begin on DOS 04/17/18.

## 2018-05-19 IMAGING — CR DG CHEST 2V
2 series · 2 of 2 positions shown · non-contrast
Comparison: 05/11/2017

CLINICAL DATA: Chest pain radiating into the right neck

EXAM:
CHEST - 2 VIEW

[w chest lat]
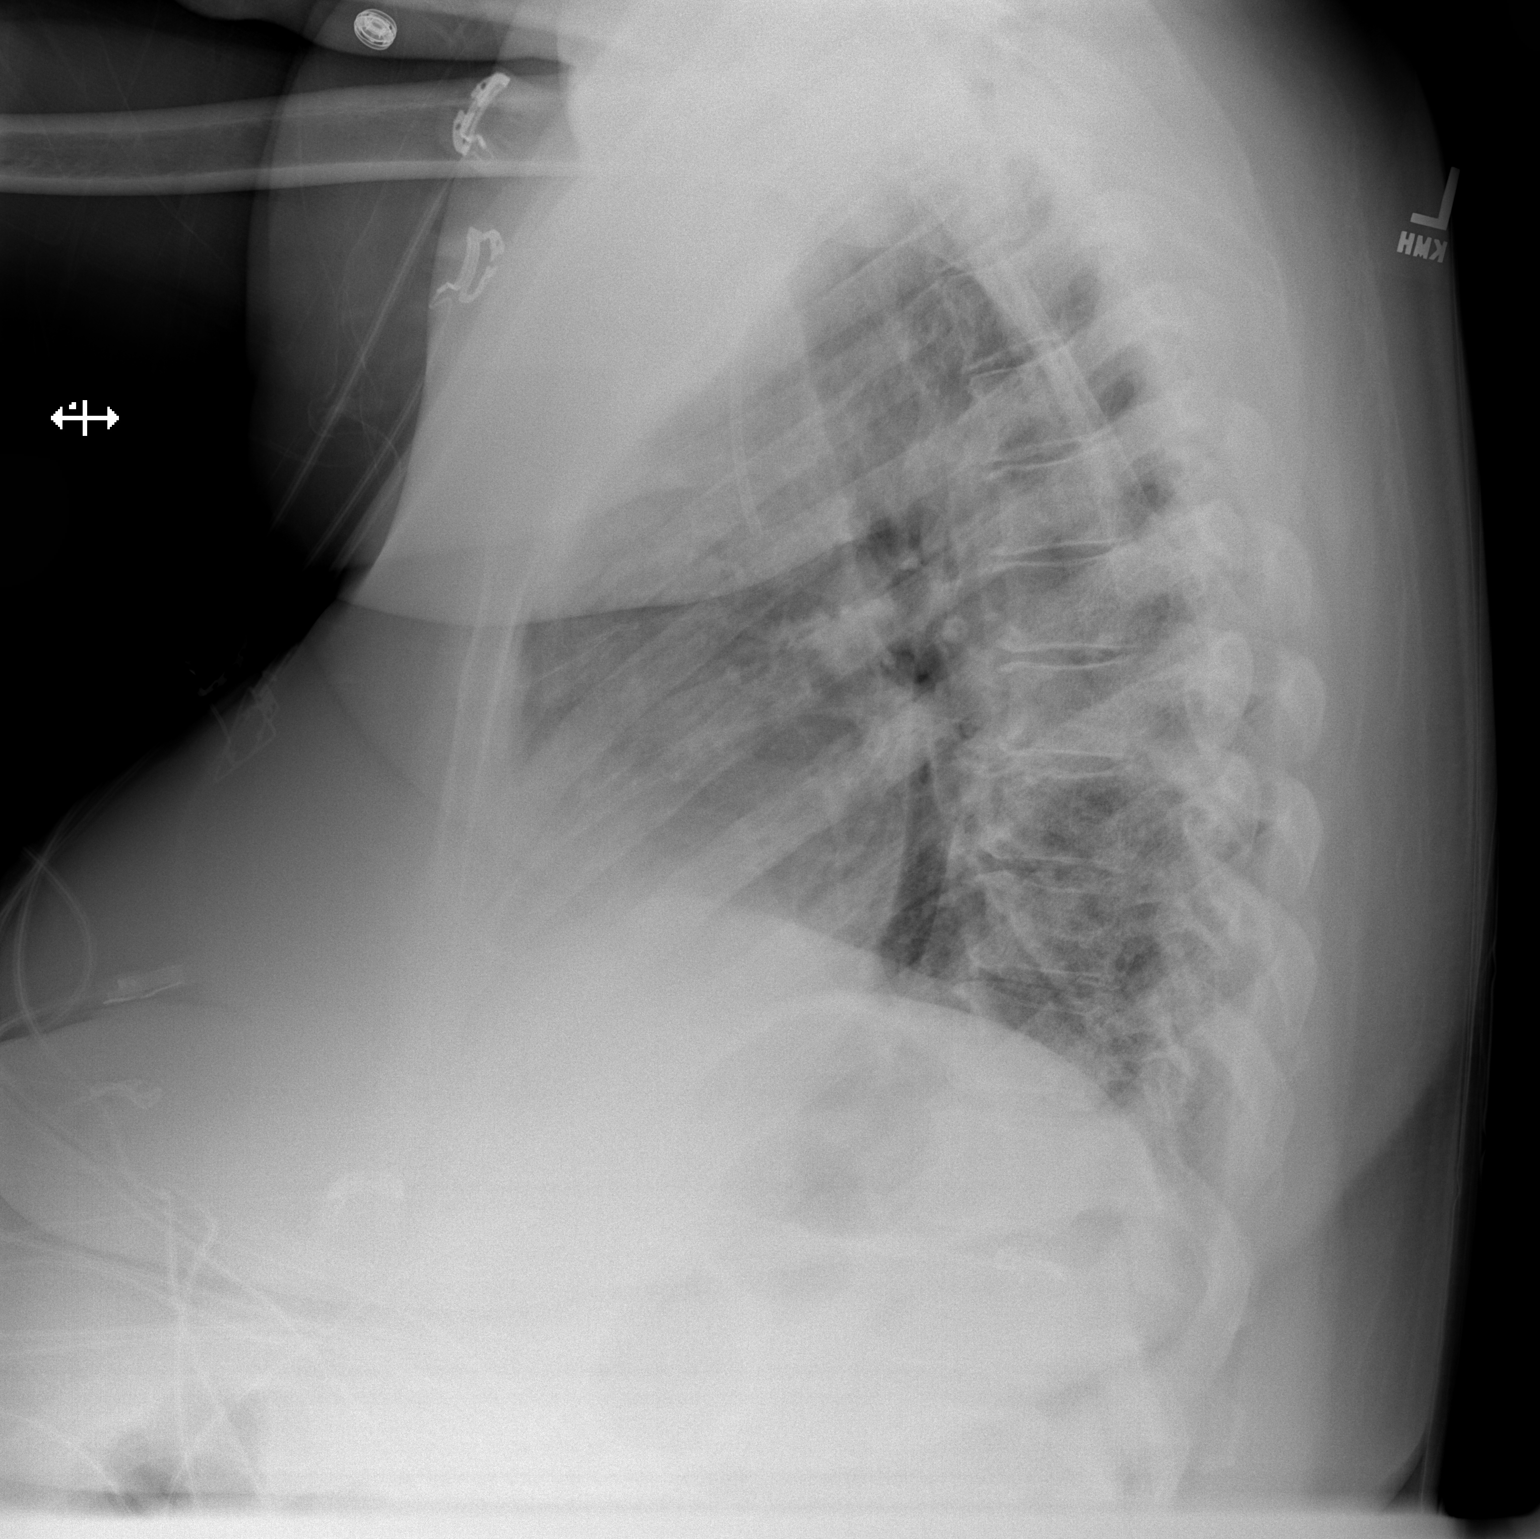

[x chest ap]
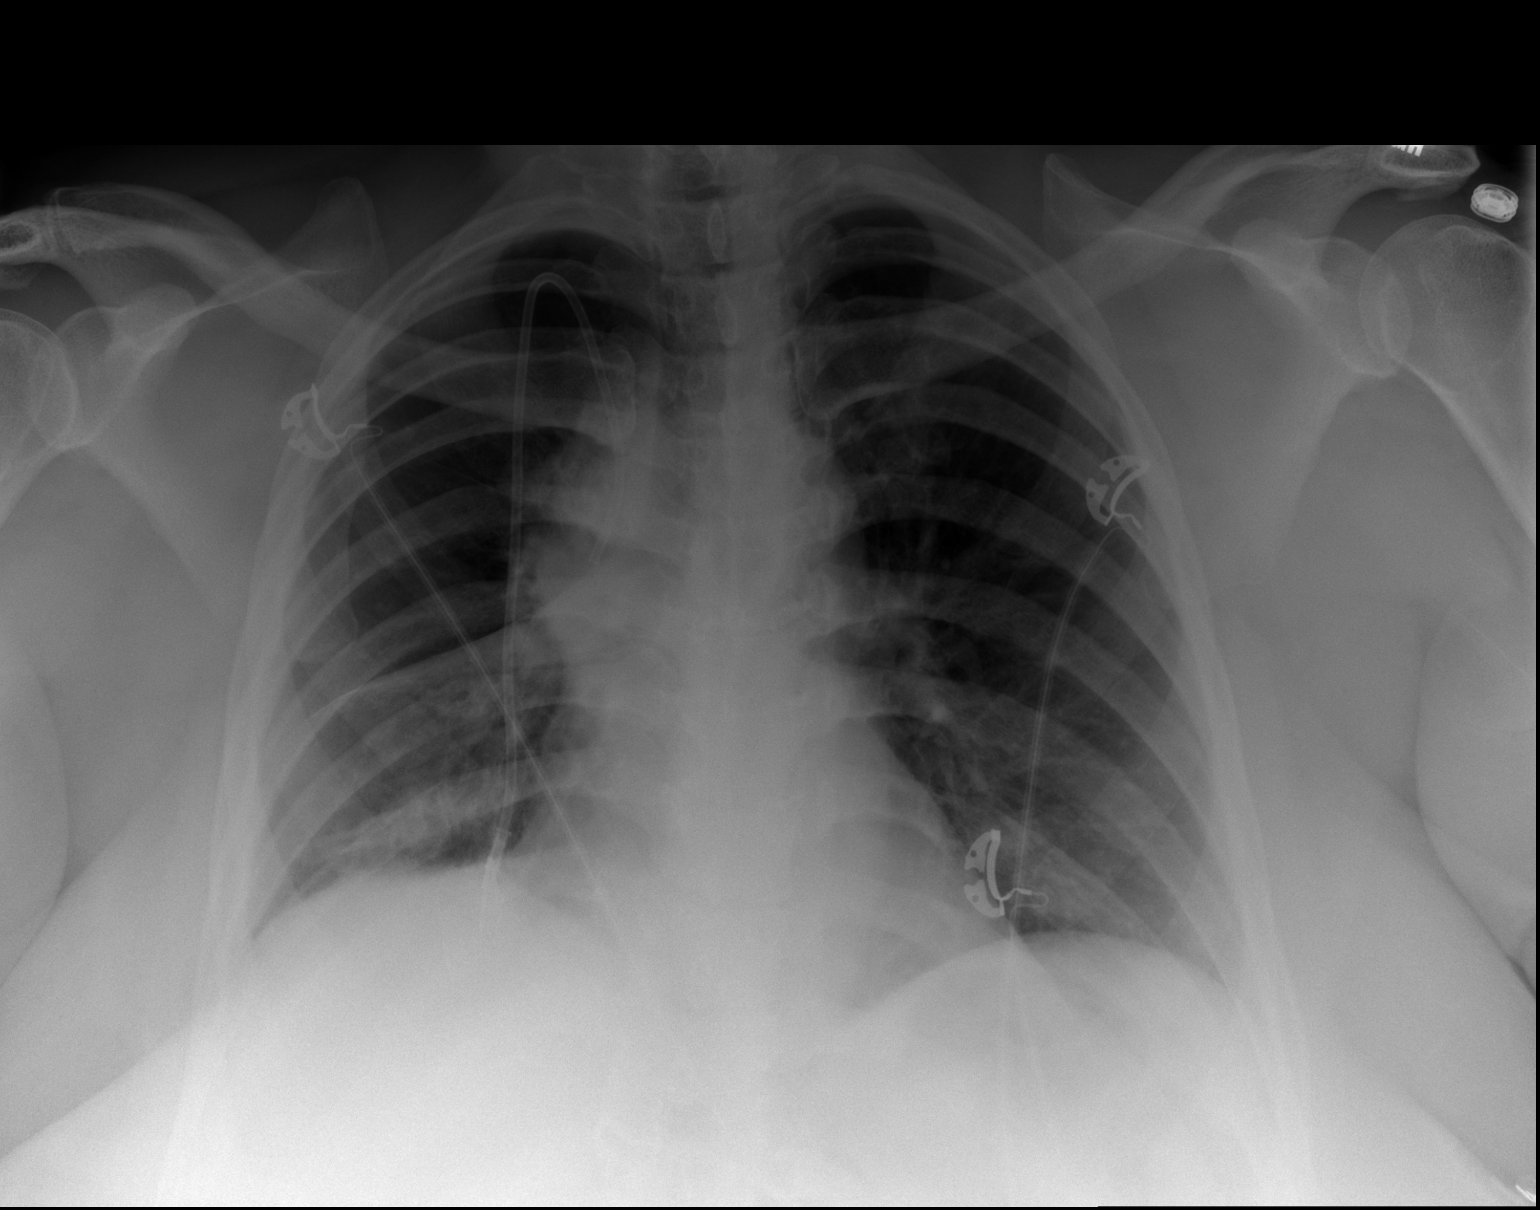

[2 of 2 positions shown; findings below may reference images not displayed]

FINDINGS: Cardiac shadow is within normal limits. Right chest wall port is
noted with the catheter tip in the mid superior vena cava.
Postradiation changes are seen in the right upper and lower lobes,
stable from prior CT examination. Left lung remains clear. No
sizable effusion is noted. No bony abnormality is seen.
IMPRESSION: Chronic changes in the right lung.  No acute abnormality is noted.

When compared with the prior CT examination there is some filling
defect within the superior vena cava which may represent thrombus.
The history of right neck pain raises suspicion for right jugular
thrombus. Ultrasound may be helpful for further evaluation.

## 2018-05-22 ENCOUNTER — Telehealth: Payer: Self-pay | Admitting: *Deleted

## 2018-05-22 NOTE — Telephone Encounter (Signed)
Late entry for 05/19/2018: Contacted by Joelene Millin, PT with [facility]. She states patient is receiving home PT and has increased stamina and strength since starting. She states patient's O2 saturation has improved and is not in the low 90's with ambulation. She is asking if MD will give orders to continue PT once a week for 2 more weeks.  Dr. Irene Limbo given information and gave verbal order to continue Home PT.  NOTE:Per Amber, patient's Medicare will begin June 04, 2018 and patient will need Dr. Irene Limbo to send new referral with June 04, 2018 as new start date to coincide with start of Medicare. Informed Dr. Irene Limbo of this.

## 2018-05-24 ENCOUNTER — Telehealth: Payer: Self-pay | Admitting: *Deleted

## 2018-05-24 ENCOUNTER — Ambulatory Visit (HOSPITAL_COMMUNITY)
Admission: RE | Admit: 2018-05-24 | Discharge: 2018-05-24 | Disposition: A | Discharge status: Home / Self Care | Payer: Medicaid Other | Source: Ambulatory Visit | Attending: Hematology | Admitting: Hematology

## 2018-05-24 DIAGNOSIS — R0602 Shortness of breath: Secondary | ICD-10-CM | POA: Insufficient documentation

## 2018-05-24 NOTE — Telephone Encounter (Signed)
Received faxed home PT orders for patient. Dr. Irene Limbo signed. Faxed signed faxed orders to [place], [place] office @ [phone] - fax confirmation received.

## 2018-05-25 ENCOUNTER — Other Ambulatory Visit: Payer: Self-pay | Admitting: Hematology

## 2018-05-25 ENCOUNTER — Telehealth (HOSPITAL_COMMUNITY): Payer: Self-pay | Admitting: Radiology

## 2018-05-25 ENCOUNTER — Telehealth: Payer: Self-pay | Admitting: *Deleted

## 2018-05-25 ENCOUNTER — Other Ambulatory Visit: Payer: Self-pay | Admitting: *Deleted

## 2018-05-25 ENCOUNTER — Ambulatory Visit (HOSPITAL_COMMUNITY)
Admission: RE | Admit: 2018-05-25 | Discharge: 2018-05-25 | Disposition: A | Discharge status: Home / Self Care | Payer: Medicaid Other | Source: Ambulatory Visit | Attending: Hematology | Admitting: Hematology

## 2018-05-25 ENCOUNTER — Encounter (HOSPITAL_COMMUNITY): Payer: Self-pay | Admitting: Radiology

## 2018-05-25 DIAGNOSIS — C3490 Malignant neoplasm of unspecified part of unspecified bronchus or lung: Secondary | ICD-10-CM

## 2018-05-25 DIAGNOSIS — J90 Pleural effusion, not elsewhere classified: Secondary | ICD-10-CM | POA: Insufficient documentation

## 2018-05-25 HISTORY — PX: IR THORACENTESIS ASP PLEURAL SPACE W/IMG GUIDE: IMG5380

## 2018-05-25 LAB — GRAM STAIN

## 2018-05-25 LAB — PROTEIN, PLEURAL OR PERITONEAL FLUID: Total protein, fluid: 4.3 g/dL

## 2018-05-25 LAB — BODY FLUID CELL COUNT WITH DIFFERENTIAL
Eos, Fluid: 1 %
Lymphs, Fluid: 68 %
Monocyte-Macrophage-Serous Fluid: 18 % — ABNORMAL LOW (ref 50–90)
Neutrophil Count, Fluid: 13 % (ref 0–25)
WBC FLUID: 1410 uL — AB (ref 0–1000)

## 2018-05-25 LAB — LACTATE DEHYDROGENASE, PLEURAL OR PERITONEAL FLUID: LD, Fluid: 131 U/L — ABNORMAL HIGH (ref 3–23)

## 2018-05-25 MED ORDER — LIDOCAINE HCL 1 % IJ SOLN
INTRAMUSCULAR | Status: AC
  Filled 2018-05-25: qty 20

## 2018-05-25 MED ORDER — LIDOCAINE HCL (PF) 1 % IJ SOLN
INTRAMUSCULAR | Status: AC | PRN
  Administered 2018-05-25: 10 mL

## 2018-05-25 NOTE — Procedures (Signed)
PROCEDURE SUMMARY:  [place] (R)chest finds small to moderate effusion, poorly visualized due to body habitus.  Successful US guided right thoracentesis. Yielded 270 mL of clear yellow fluid. Pt tolerated procedure well. No immediate complications.  Specimen was sent for labs. CXR ordered.  EBL < 5 mL  Ascencion Dike PA-C 05/25/2018 3:41 PM

## 2018-05-25 NOTE — Telephone Encounter (Signed)
Received call from Katharine Look, RN that pt needed thora ASAP. Called pt and added her on the IR schedule for today (05/25/18). She is on her way now for treatment 1415. JM

## 2018-05-25 NOTE — Telephone Encounter (Signed)
Patient called, left voice mail: stated she had CT of her chest yesterday and they told her she had water on her lungs. She is having some chest pain and is short of breath.  Dr. Irene Limbo given message and reviewed CT - per [facility], results were in Epic with changes noted in Impressions 4 & 5. Per Dr. Irene Limbo, he will order stat Thoracentesis for patient to be done today or tomorrow. Advised patient that radiology will call her regarding time. Patient will be scheduled for 9am appt Tuesday 2/25 to see Dr. Irene Limbo. Per Dr. Irene Limbo if chest pain or shortness of breath increases she should go to the ED immediately. Patient verbalized understanding.

## 2018-05-29 NOTE — Progress Notes (Signed)
[name]    HEMATOLOGY/ONCOLOGY CLINIC NOTE  Date of Service: 05/30/18     Patient Care Team: [name], FNP as PCP - General (Nurse Practitioner) Pixie Casino, MD as PCP - Cardiology (Cardiology)  CHIEF COMPLAINTS  F/u for continued management of metastatic lung adenocarcinoma  Diagnosis Metastatic Lung Adenocarcinoma with rt flank subcutaneous metastasis.  Current treatment:  Nivolumab q2 weeks Palliative RT to pain rt inguinal mass   PreviousTreatment -Concurrent Chemo-radiation with Carboplatin/Taxol. Completed RT on 02/13/2016 and has then completed 2 cycles of carboplatin + taxol. -Started Maintenance Durvalumab from  05/24/2016 q2weeks, switched to Nivolumab q2weeks on 12/13/16 once metastatic disease noted -s/p palliative RT to rt sided Subcutaneous metastatic mass.    HISTORY OF PRESENTING ILLNESS: Plz see my previous note for details on initial presentation.  INTERVAL HISTORY   Marilyn [name] presents to the office today for followup of her metastatic lung cancer. The patient's last visit with [place] was on 05/17/18. The pt reports that she is doing well overall.   In the interim, the pt had 210m drained in a therapeutic thoracentesis on 05/25/18.  The pt reports that she is breathing much better and is back to her baseline, denies coughing, denies phlegm. Endorses clear nasal discharge. She denies sinus pressure or headaches.  The pt notes that she is having some abdominal pain in the right side, and central abdomen, below her ribs. It doesn't change when she moves her bowels. She notes that her pain began shortly after completing radiation in early January, and has increased since then. She still has her gallbladder. She notes that the right side of her abdomen is painful to the touch, and finds it uncomfortable to eat in the afternoon.  The pt notes that she has followed up with social work and will now be able to see a PCP in April. She has continued to not be  able to receive insulin due to insurance difficulties. She starts medicare on 06/04/18. She has rationed out her insulin and is only taking 15 units with breakfast. The pt notes that she has not called her previous PCP's office regarding this yet, and she will do this after our visit.  Of note since the patient's last visit, pt has had a CT Chest completed on 05/24/18 with results revealing Mixed interval change. 2. Moderate dependent right pleural effusion, significantly increased. 3. Right axillary adenopathy is decreased. 4. New left supraclavicular and left posterior mediastinal lymphadenopathy. Increased right retrocrural adenopathy. These findings are worrisome for progression of metastatic nodal disease. 5. New mild patchy nodular consolidation in the peripheral right middle lobe, which could be inflammatory or neoplastic. 6. Similar finely nodular patchy thickening of the peripheral peribronchovascular interstitium and interlobular septa in the lower lungs bilaterally, nonspecific, lymphangitic tumor not excluded. 7. Nonspecific increased right breast skin thickening, possibly treatment related. Aortic Atherosclerosis.  Lab results today (05/30/18) of CBC w/diff and CMP is as follows: all values are WNL.  On review of systems, pt reports breathing well, clear nasal discharge, right sided and central abdominal pain, stable energy levels, and denies phlegm production, coughing, headaches, sinus pressure, leg swelling, and any other symptoms.  MEDICAL HISTORY:  Past Medical History:  Diagnosis Date  . Arthritis   . Asthma   . Diabetes mellitus    Type II  . Hemorrhoids   . Hypercholesteremia   . Hypertension   . Hypothyroidism   . Metastatic lung cancer (metastasis from lung to other site) ([facility]  Lung, Mets to skin- right flank. CT angio 2019 suspicious for axillary, external iliac, right inguinal mets  . 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 NICM (nonischemic  cardiomyopathy) ([place])    a. mild - EF 45-50% by echo 10/2016 but EF 52% by low risk nuc 11/2016. b. Echo 07/2017 - EF normal, mild AI, unremarkable echo otherwise.  . Obese   . Pneumonia 2017  . Sinus tachycardia    a. dates back to at least 2013, etiology unclear.    SURGICAL HISTORY: Past Surgical History:  Procedure Laterality Date  . CESAREAN SECTION    . COLONOSCOPY    . I/D  arm Right 2013  . IR FLUORO GUIDE PORT INSERTION RIGHT  02/18/2017  . IR GENERIC HISTORICAL  01/08/2016   IR US GUIDE VASC ACCESS RIGHT 01/08/2016 [name], DO [facility]-INTER[facility] RAD  . IR GENERIC HISTORICAL  01/08/2016   IR FLUORO GUIDE CV LINE RIGHT 01/08/2016 [name], DO [facility]  . IR THORACENTESIS ASP PLEURAL SPACE W/IMG GUIDE  05/25/2018  . IR US GUIDE VASC ACCESS RIGHT  02/18/2017  . MULTIPLE EXTRACTIONS WITH ALVEOLOPLASTY N/A 03/25/2017   Procedure: MULTIPLE EXTRACTION WITH ALVEOLOPLASTY (teeth #s four, six, seven, eight, nine, ten, eleven, one, twenty-three, twenty-four, twenty-five, twenty-six, twenty-nine, thirty);  Surgeon: [name], [name];  Location: [place];  Service: Oral Surgery;  Laterality: N/A;  . TUBAL LIGATION    . US guided core needle biopsy     of right lower neck/supraclavicular lymph nodes    SOCIAL HISTORY: Social History   Socioeconomic History  . Marital status: Divorced    Spouse name: Not on file  . Number of children: Not on file  . Years of education: Not on file  . Highest education level: Not on file  Occupational History  . Not on file  Social Needs  . Financial resource strain: Not on file  . Food insecurity:    Worry: Not on file    Inability: Not on file  . Transportation needs:    Medical: No    Non-medical: No  Tobacco Use  . Smoking status: Never Smoker  . Smokeless tobacco: Never Used  Substance and Sexual Activity  . Alcohol use: No  . Drug use: No  . Sexual activity: Not Currently  Lifestyle  . Physical activity:    Days per week: Not on file     Minutes per session: Not on file  . Stress: Not on file  Relationships  . Social connections:    Talks on phone: Not on file    Gets together: Not on file    Attends religious service: Not on file    Active member of club or organization: Not on file    Attends meetings of clubs or organizations: Not on file    Relationship status: Not on file  . Intimate partner violence:    Fear of current or ex partner: No    Emotionally abused: No    Physically abused: No    Forced sexual activity: No  Other Topics Concern  . Not on file  Social History Narrative   No bird or mold exposure. No recent travel.   11-23-17 Unable to ask abuse questions mother with her today.    FAMILY HISTORY: Family History  Problem Relation Age of Onset  . Heart failure Mother   . Hypertension Mother   . Cancer Neg Hx   . Rheumatologic disease Neg Hx  ALLERGIES:  has No Known Allergies.  MEDICATIONS:  Current Outpatient Medications  Medication Sig Dispense Refill  . ASPIRIN LOW DOSE 81 MG EC tablet TAKE 1 TABLET BY MOUTH EVERY DAY (Patient taking differently: Take 81 mg by mouth daily. Enteric Coated.  ** DO NOT CRUSH **) 60 tablet 2  . atorvastatin (LIPITOR) 40 MG tablet Take 1 tablet (40 mg total) by mouth daily at 6 PM. 30 tablet 0  . DULoxetine (CYMBALTA) 60 MG capsule TAKE 1 CAPSULE BY MOUTH EVERY DAY (Patient taking differently: Take 60 mg by mouth daily. ) 30 capsule 2  . fluconazole (DIFLUCAN) 200 MG tablet Take 1 tablet (200 mg total) by mouth daily. 1 tablet 0  . hydrOXYzine (ATARAX/VISTARIL) 10 MG tablet Take 1 tablet (10 mg total) by mouth 3 (three) times daily as needed for itching. 30 tablet 0  . insulin aspart (NOVOLOG) 100 UNIT/ML FlexPen As per sliding scale instructions (Patient taking differently: Inject 0-10 Units into the skin 3 (three) times daily. As per sliding scale instructions) 15 mL 2  . Insulin Degludec (TRESIBA FLEXTOUCH) 200 UNIT/ML SOPN Inject 100 Units into the skin  daily before breakfast.     . levalbuterol (XOPENEX HFA) 45 MCG/ACT inhaler Inhale 1-2 puffs into the lungs every 8 (eight) hours as needed for wheezing. 1 Inhaler 12  . levothyroxine (SYNTHROID, LEVOTHROID) 150 MCG tablet TAKE 1 TABLET BY MOUTH EVERY DAY BEFORE BREAKFAST (Patient taking differently: Take 150 mcg by mouth daily before breakfast. ) 30 tablet 2  . lidocaine-prilocaine (EMLA) cream Apply 1 application topically as needed. 30 g 6  . metoprolol succinate (TOPROL-XL) 50 MG 24 hr tablet Take 1 tablet (18m) by mouth in the morning and 1/2 tablet (281m in the evening. Take with or immediately following a meal. (Patient taking differently: Take 25-50 mg by mouth See admin instructions. Take 1 tablet (5078mby mouth in the morning and 1/2 tablet (17m75mn the evening. Take with or immediately following a meal.) 135 tablet 3  . naproxen (NAPROSYN) 500 MG tablet Take 1 tablet (500 mg total) by mouth 2 (two) times daily with a meal. 20 tablet 0  . nitrofurantoin, macrocrystal-monohydrate, (MACROBID) 100 MG capsule Take 1 capsule (100 mg total) by mouth every 12 (twelve) hours. 8 capsule 0  . oxyCODONE-acetaminophen (PERCOCET) 5-325 MG tablet Take 1 tablet by mouth every 6 (six) hours as needed for moderate pain or severe pain. 50 tablet 0  . pregabalin (LYRICA) 50 MG capsule Take 50 mg by mouth 3 (three) times daily.    . seMarland Kitchenna-docusate (SENNA S) 8.6-50 MG tablet Take 2 tablets by mouth 2 (two) times daily. May reduce to 2 tab po HS as needed once regular BM estabished (Patient taking differently: Take 2 tablets by mouth 2 (two) times daily as needed (FOR CONSTIPATION). =) 60 tablet 1  . traZODone (DESYREL) 50 MG tablet Take 1-2 tablets (50-100 mg total) by mouth at bedtime as needed for sleep. 60 tablet 0  . Vitamins A & D (VITAMIN A & D) OINT Apply 1 application topically 2 (two) times daily. 1 Tube 3   No current facility-administered medications for this visit.    Facility-Administered  Medications Ordered in Other Visits  Medication Dose Route Frequency Provider Last Rate Last Dose  . sodium chloride 0.9 % injection 10 mL  10 mL Intravenous PRN KaleBrunetta Genera   10 mL at 05/24/16 1252    REVIEW OF SYSTEMS:    A 10+ POINT REVIEW  OF SYSTEMS WAS OBTAINED including neurology, dermatology, psychiatry, cardiac, respiratory, lymph, extremities, GI, GU, Musculoskeletal, constitutional, breasts, reproductive, HEENT.  All pertinent positives are noted in the HPI.  All others are negative.   PHYSICAL EXAMINATION: ECOG PERFORMANCE STATUS: 1 - Symptomatic but completely ambulatory  Vitals:   05/30/18 0943  BP: (!) 174/86  Pulse: (!) 111  Resp: 18  Temp: 97.7 F (36.5 C)  SpO2: 100%   Filed Weights   05/30/18 0943  Weight: 276 lb 6.4 oz (125.4 kg)   .Body mass index is 40.82 kg/m.  GENERAL:alert, in no acute distress and comfortable SKIN: no acute rashes, no significant lesions EYES: conjunctiva are pink and non-injected, sclera anicteric OROPHARYNX: MMM, no exudates, no oropharyngeal erythema or ulceration NECK: supple, no JVD LYMPH:  no palpable lymphadenopathy in the cervical, axillary or inguinal regions LUNGS: mildly decreased breath sounds 1/5 up right lung base, no rales no rhonchi HEART: regular rate & rhythm ABDOMEN:  normoactive bowel sounds, tenderness to palpation in RUQ and epigastric areas, not distended. No palpable hepatosplenomegaly. Extremity: no pedal edema PSYCH: alert & oriented x 3 with fluent speech NEURO: no focal motor/sensory deficits    LABORATORY DATA: .  [name] CBC Latest Ref Rng & Units 05/30/2018 05/17/2018 05/02/2018  WBC 4.0 - 10.5 K/uL 4.9 4.8 6.3  Hemoglobin 12.0 - 15.0 g/dL 12.4 10.8(L) 9.4(L)  Hematocrit 36.0 - 46.0 % 39.0 34.1(L) 29.4(L)  Platelets 150 - 400 K/uL 243 263 264    . CMP Latest Ref Rng & Units 05/30/2018 05/17/2018 05/02/2018  Glucose 70 - 99 mg/dL 343(H) 372(H) 325(H)  BUN 6 - 20 mg/dL _0 Creatinine  0.44 - 1.00 mg/dL 1.08(H) 0.91 0.84  Sodium 135 - 145 mmol/L 137 138 139  Potassium 3.5 - 5.1 mmol/L 4.0 3.8 3.8  Chloride 98 - 111 mmol/L 99 100 104  CO2 22 - 32 mmol/L _1 Calcium 8.9 - 10.3 mg/dL 9.1 8.5(L) 8.8(L)  Total Protein 6.5 - 8.1 g/dL 8.3(H) 7.5 7.3  Total Bilirubin 0.3 - 1.2 mg/dL 0.4 0.3 0.2(L)  Alkaline Phos 38 - 126 U/L 91 85 102  AST 15 - 41 U/L 21 12(L) 12(L)  ALT 0 - 44 U/L _2 08/18/17 Bx:   08/16/17 Molecular Pathology:    RADIOGRAPHIC STUDIES:  .Dg Chest 1 View  Result Date: 05/25/2018 CLINICAL DATA:  Pleural effusion; post thoracentesis. Hx of PNA, NICM, metastatic lung cancer, HTN, DM, AND asthma. Pt is a current smoker. EXAM: CHEST  1 VIEW COMPARISON:  04/03/2018 FINDINGS: No pneumothorax. Small to moderate residual right pleural effusion with consolidation/atelectasis in the right lower lung. Progressive right perihilar masslike consolidation. Left lung clear. Heart size normal. Right IJ port catheter to the mid SVC. Visualized bones unremarkable. IMPRESSION: No pneumothorax post right thoracentesis. Electronically Signed   By: Lucrezia Europe M.D.   On: 05/25/2018 16:35   Ct Chest Wo Contrast  Result Date: 05/24/2018 CLINICAL DATA:  Right lung adenocarcinoma status post chemotherapy and radiation therapy with ongoing immunotherapy. Worsening dyspnea. History of palliative radiation therapy to the right chest. EXAM: CT CHEST WITHOUT CONTRAST TECHNIQUE: Multidetector CT imaging of the chest was performed following the standard protocol without IV contrast. COMPARISON:  04/03/2018 chest CT angiogram. FINDINGS: Cardiovascular: Normal heart size. No significant pericardial effusion/thickening. Right internal jugular Port-A-Cath terminates in the middle third of the SVC. Left anterior descending coronary atherosclerosis.  Atherosclerotic nonaneurysmal thoracic aorta. Normal caliber pulmonary arteries. Mediastinum/Nodes: No discrete thyroid  nodules. Unremarkable esophagus. Right axillary adenopathy is decreased, for example a 2.8 cm right axillary node (series 2/image 51), decreased from 3.5 cm. New left supraclavicular adenopathy measuring up to 1.5 cm (series 2/image 9). No left axillary adenopathy. Stable enlarged 1.9 cm right paratracheal node (series 2/image 49). Mildly increased 1.5 cm right retrocrural node (series 2/image 126), previously 1.2 cm. Newly enlarged 1.0 cm left para-aortic posterior mediastinal node (series 2/image 96). No discrete hilar adenopathy on this noncontrast scan. Lungs/Pleura: No pneumothorax. Moderate dependent right pleural effusion, significantly increased. No left pleural effusion. Stable sharply marginated right perihilar consolidation with associated volume loss, distortion and bronchiectasis, compatible with radiation fibrosis. Increased passive atelectasis in the dependent right lung. New patchy nodular consolidation in the peripheral right middle lobe measuring up to 2.1 cm (series 5/image 64). Patchy finely nodular thickening of peripheral peribronchovascular interstitium and interlobular septa in the lower lungs bilaterally, similar. Upper abdomen: Vague low-attenuation subcapsular foci adjacent to the intersegmental fissure and at the left liver dome are unchanged since 08/03/2017 PET-CT, where they were non FDG avid, most compatible with benign focal fat. Musculoskeletal: No aggressive appearing focal osseous lesions. Moderate thoracic spondylosis. Increased nonspecific asymmetric skin thickening throughout the right breast. IMPRESSION: 1. Mixed interval change. 2. Moderate dependent right pleural effusion, significantly increased. 3. Right axillary adenopathy is decreased. 4. New left supraclavicular and left posterior mediastinal lymphadenopathy. Increased right retrocrural adenopathy. These findings are worrisome for progression of metastatic nodal disease. 5. New mild patchy nodular consolidation in the  peripheral right middle lobe, which could be inflammatory or neoplastic. 6. Similar finely nodular patchy thickening of the peripheral peribronchovascular interstitium and interlobular septa in the lower lungs bilaterally, nonspecific, lymphangitic tumor not excluded. 7. Nonspecific increased right breast skin thickening, possibly treatment related. Aortic Atherosclerosis (ICD10-I70.0). These results will be called to the ordering clinician or representative by the Radiologist Assistant, and communication documented in the PACS or zVision Dashboard. Electronically Signed   By: Ilona Sorrel M.D.   On: 05/24/2018 17:12   Vas [place] Upper Extremity Venous Duplex  Result Date: 05/03/2018 UPPER VENOUS STUDY  Indications: Pain Performing Technologist: Oliver Hum RVT  Examination Guidelines: A complete evaluation includes B-mode imaging, spectral Doppler, color Doppler, and power Doppler as needed of all accessible portions of each vessel. Bilateral testing is considered an integral part of a complete examination. Limited examinations for reoccurring indications may be performed as noted.  Right Findings: +----------+------------+----------+---------+-----------+-------+ RIGHT     CompressiblePropertiesPhasicitySpontaneousSummary +----------+------------+----------+---------+-----------+-------+ IJV           Full                 Yes       Yes            +----------+------------+----------+---------+-----------+-------+ Subclavian    Full                 Yes       Yes            +----------+------------+----------+---------+-----------+-------+ Axillary      Full                 Yes       Yes            +----------+------------+----------+---------+-----------+-------+ Brachial      Full                 Yes  Yes            +----------+------------+----------+---------+-----------+-------+ Radial        Full                                           +----------+------------+----------+---------+-----------+-------+ Ulnar         Full                                          +----------+------------+----------+---------+-----------+-------+ Cephalic      Full                                          +----------+------------+----------+---------+-----------+-------+ Basilic       Full                                          +----------+------------+----------+---------+-----------+-------+  Left Findings: +----------+------------+----------+---------+-----------+-------+ LEFT      CompressiblePropertiesPhasicitySpontaneousSummary +----------+------------+----------+---------+-----------+-------+ IJV           Full                 Yes       Yes            +----------+------------+----------+---------+-----------+-------+ Subclavian    Full                 Yes       Yes            +----------+------------+----------+---------+-----------+-------+ Axillary      Full                 Yes       Yes            +----------+------------+----------+---------+-----------+-------+ Brachial      Full                 Yes       Yes            +----------+------------+----------+---------+-----------+-------+ Radial        Full                                          +----------+------------+----------+---------+-----------+-------+ Ulnar         Full                                          +----------+------------+----------+---------+-----------+-------+ Cephalic      Full                                          +----------+------------+----------+---------+-----------+-------+ Basilic       Full                                          +----------+------------+----------+---------+-----------+-------+  Summary:  Right: No evidence of deep vein thrombosis in the upper extremity. No evidence of superficial vein thrombosis in the upper extremity.  Left: No evidence of deep vein thrombosis in  the upper extremity. No evidence of superficial vein thrombosis in the upper extremity.  *See table(s) above for measurements and observations.  Diagnosing physician: Monica Martinez MD Electronically signed by Monica Martinez MD on 05/03/2018 at 4:00:25 PM.    Final    Ir Thoracentesis Asp Pleural Space W/img Guide  Result Date: 05/25/2018 INDICATION: Right-sided lung cancer. Shortness of breath. Right-sided pleural effusion. Request for diagnostic and therapeutic thoracentesis. EXAM: ULTRASOUND GUIDED RIGHT THORACENTESIS MEDICATIONS: None. COMPLICATIONS: None immediate. PROCEDURE: An ultrasound guided thoracentesis was thoroughly discussed with the patient and questions answered. The benefits, risks, alternatives and complications were also discussed. The patient understands and wishes to proceed with the procedure. Written consent was obtained. Ultrasound was performed to localize and mark an adequate pocket of fluid in the right chest. The area was then prepped and draped in the normal sterile fashion. 1% Lidocaine was used for local anesthesia. Under ultrasound guidance a 6 Fr Safe-T-Centesis catheter was introduced. Thoracentesis was performed. The catheter was removed and a dressing applied. FINDINGS: A total of approximately 270 mL of clear yellow fluid was removed. Samples were sent to the laboratory as requested by the clinical team. IMPRESSION: Successful ultrasound guided right thoracentesis yielding 270 mL of pleural fluid. Read by: Ascencion Dike PA-C No pneumothorax on follow-up chest radiograph. Electronically Signed   By: Lucrezia Europe M.D.   On: 05/25/2018 16:21     ASSESSMENT & PLAN:   53 y.o.  African-American female with history of hypertension, diabetes, asthma, dysuria mass and morbid obesity with    #1 Metastatic Lung Adenocarcinoma -on presentation Rt sided at least Stage IIIB (on diagnosis) with large right paratracheal mass with mediastinal adenopathy that appears to have  grown significantly over the last 6-7 months and rt supraclavicular LN +.  -Noted to have a small mass in the left adrenal on CT but PET/CT neg for metastatic disease. Patient has been a lifelong nonsmoker -MRI of the brain was negative for any metastatic disease. At diagnosis. -High PDL1 expression (90%) on [employer] One Neg for EGFR, ALK, ROS-1 and BRAF mutations. -Patient completed her planned definitive chemoradiation with Carbo Taxol on 02/13/2016. No prohibitive toxicities other than some grade 1 skin desquamation and some grade 1-2 radiation esophagitis. She has subsequently received 2 out of 2 planned dose of carboplatin + Taxol. -CTA chest 10/29/2016  no evidence of lung cancer progression in the chest. No PE -CT abd/pelvis-10/29/2016  Interval 5.0 x 5.0 x 4.7 cm mass in the right lateral subcutaneous fat at the level of the mid abdomen. This has CT features compatible with a metastasis or primary neoplasm. -CT C/A/P (05/12/2017): Interval development of right axillary, right external iliac and right inguinal adenopathy. Suspicious for nodal metastasis. 2. Decrease in size of right paratracheal adenopathy. 3. Decrease in size and enhancement associated with right lateral body wall lesion. 4. New small nonspecific pulmonary nodule in the left lower lobe measuring 6 mm. 5. Stable appearance of changes secondary to external beam radiation within the right lung  08/03/17 PET which revealed 2.8 cm right axillary lymph node is markedly hypermetabolic and consistent with metastatic disease. Extensive radiation changes involving the right paramediastinal lung. There is a focus of hypermetabolism in the right lower lobe which is suspicious for residual or recurrent tumor.  11/11/17 CT C/A/P revealed  There has been interval increase in number of enlarged of right axillary nodes and increase in size of right inguinal adenopathy compatible with progression of disease. Stable appearance of the right lung with  changes of external beam radiation.    03/01/18 CT C/A/P revealed Bulky right subpectoral/axillary adenopathy, markedly progressive from 11/11/2017. Enlarging retrocrural lymph node. Low right paratracheal adenopathy is stable. 2. Interval resection of a right inguinal nodal mass. Stable right external iliac lymph node. 3. Cholelithiasis.  Stable common bile duct dilatation. 4.  Aortic atherosclerosis.  she received maintenance Durvalumab from  05/24/2016 q2weeks, switched to Nivolumab q2weeks on 12/13/16 in the setting of Biopsy proven isolated metastatic disease. Given the tumor is strongly PDL1 positive and patient has only isolated metastatic disease was treated with palliative RT to metastases with significant improvement. Molecular pathology from 515/19 did not reveal obviously targetable mutations with NF1 and TP53 mutations present.  Plan:  -Discussed pt labwork today, 05/30/18- stable. -Discussed the 05/24/18 CT Chest which revealed Mixed interval change. 2. Moderate dependent right pleural effusion, significantly increased. 3. Right axillary adenopathy is decreased. 4. New left supraclavicular and left posterior mediastinal lymphadenopathy. Increased right retrocrural adenopathy. These findings are worrisome for progression of metastatic nodal disease. 5. New mild patchy nodular consolidation in the peripheral right middle lobe, which could be inflammatory or neoplastic. 6. Similar finely nodular patchy thickening of the peripheral peribronchovascular interstitium and interlobular septa in the lower lungs bilaterally, nonspecific, lymphangitic tumor not excluded. 7. Nonspecific increased right breast skin thickening, possibly treatment related. Aortic Atherosclerosis. -Pt had 240m clear yellow fluid removed on 05/25/18 thoracentesis -Radiographic findings related to cancer progression vs infection vs inflammation -Radiated lymph nodes have reduced in size, however new lymph node progression is also  noted -Pt does not have enough insulin right now do to recent insurance difficulty, which is prohibitive from her receiving steroids for possible immunotherapy related inflammation -Will hold Nivolumab -Will begin empiric antibiotics for possible infection -Discussed that I am concerned that we need to consider next line chemotherapy options, as her cancer is showing indication of progressing through immunotherapy -Discussed that the pt always has the option to consider being kept comfortable at any point. -Discussed that possible chemotherapy option would be up to 6 cycles of Platinum doublet of Carboplatin and Alimta -The pt will discuss her options with her family further in the interim -Will order CT A/P today for new abdominal pain and tenderness -I again discussed that I recommend prioritizing and securing insurance coverage for optimization of her DM and hypothyroidism -Advised that the pt call her PCP's office, TSelina Cooley FNP  -Folliculitis from high blood sugars, keep well moisturized, and follow up with social work to [employer] help purchasing insulin and thyroid medications -Home O2, and Social work referral -Recommend pt keep her arm elevated and stressed the importance of PT as well  -Vitamin A&D ointment and Hydorxyzine for vaginal itching  -Advised that pt continue follow up with her cardiologist -Advised patient to follow up with her PCP regarding her Lyrica dose. -Will see the pt back in 2 weeks  #3 h/o radiation pneumonitis - no new symptoms. Does has some cough with exertion but this has improved.  #4 Improving fatigue (grade 1) - improved fatigue with levothyroxine compliance with near normalization of FT4 levels.  - previously given TSH of 12 -- increased Levolthyroxine from 1278m to 15074m ---will likely need larger dose but in the setting of baseline tachycardia we are gradually increasing dose  to avoid ppt cardiac arrhythmias.  TSH elevated due to  medication non compliance.  #5 normocytic anemia related to chemotherapy -resolving and stable  #6 Left chest wall pain due to costochondritis - mx with pain medications. CTA x 3 neg for PE. No significant pain at this time.  #7 Sinus tachycardia - on BB per cards , no PE on CTA x 3 , -Still having sinus tachycardia . Partly from deconditioning and anemia. Seeing Dr. Debara Pickett from cardiology  -Continues to be on BB  #8 neoplasm related pain is currently controlled without significant medications. Clinically likely has sleep apnea . Would need to be careful with sedative medications . 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 -cancel all currently schedule treatment appointments -RTC with Dr KaIrene Limbos per appointment on 06/19/2018 with labs   All of the patients questions were answered with apparent satisfaction. The patient knows to call the clinic with any problems, questions or concerns.  The total time spent in the appt was 30 minutes and more than 50% was on counseling and direct patient cares.   GaSullivan LoneD MSAlvordAHIVMS SCUnion Medical CenterTCherokee Regional Medical Centerematology/Oncology Physician CoUnion [facility] Inc(Office):       [phone] cell):  [phone]):           [phone], ScBaldwin Jamaicaam acting as a scribe for Dr. GaSullivan Lone  .I have reviewed the above documentation for accuracy and completeness, and I agree with the above. .GBrunetta GeneraD

## 2018-05-30 ENCOUNTER — Inpatient Hospital Stay: Payer: Medicaid Other

## 2018-05-30 ENCOUNTER — Telehealth: Payer: Self-pay | Admitting: Hematology

## 2018-05-30 ENCOUNTER — Ambulatory Visit (HOSPITAL_COMMUNITY)
Admission: RE | Admit: 2018-05-30 | Discharge: 2018-05-30 | Disposition: A | Discharge status: Home / Self Care | Payer: Medicaid Other | Source: Ambulatory Visit | Attending: Hematology | Admitting: Hematology

## 2018-05-30 ENCOUNTER — Inpatient Hospital Stay (HOSPITAL_BASED_OUTPATIENT_CLINIC_OR_DEPARTMENT_OTHER): Payer: Medicaid Other | Admitting: Hematology

## 2018-05-30 VITALS — BP 174/86 | HR 111 | Temp 97.7°F | Resp 18 | Ht 69.0 in | Wt 276.4 lb

## 2018-05-30 DIAGNOSIS — D6481 Anemia due to antineoplastic chemotherapy: Secondary | ICD-10-CM | POA: Diagnosis not present

## 2018-05-30 DIAGNOSIS — Z5112 Encounter for antineoplastic immunotherapy: Secondary | ICD-10-CM | POA: Diagnosis not present

## 2018-05-30 DIAGNOSIS — R1011 Right upper quadrant pain: Secondary | ICD-10-CM | POA: Diagnosis present

## 2018-05-30 DIAGNOSIS — C7802 Secondary malignant neoplasm of left lung: Secondary | ICD-10-CM | POA: Diagnosis not present

## 2018-05-30 DIAGNOSIS — C7989 Secondary malignant neoplasm of other specified sites: Secondary | ICD-10-CM | POA: Diagnosis not present

## 2018-05-30 DIAGNOSIS — C3491 Malignant neoplasm of unspecified part of right bronchus or lung: Secondary | ICD-10-CM

## 2018-05-30 DIAGNOSIS — Z79899 Other long term (current) drug therapy: Secondary | ICD-10-CM

## 2018-05-30 DIAGNOSIS — E1140 Type 2 diabetes mellitus with diabetic neuropathy, unspecified: Secondary | ICD-10-CM

## 2018-05-30 DIAGNOSIS — R21 Rash and other nonspecific skin eruption: Secondary | ICD-10-CM

## 2018-05-30 DIAGNOSIS — G4700 Insomnia, unspecified: Secondary | ICD-10-CM

## 2018-05-30 DIAGNOSIS — G893 Neoplasm related pain (acute) (chronic): Secondary | ICD-10-CM

## 2018-05-30 DIAGNOSIS — C3492 Malignant neoplasm of unspecified part of left bronchus or lung: Secondary | ICD-10-CM

## 2018-05-30 DIAGNOSIS — E039 Hypothyroidism, unspecified: Secondary | ICD-10-CM

## 2018-05-30 DIAGNOSIS — R000 Tachycardia, unspecified: Secondary | ICD-10-CM

## 2018-05-30 DIAGNOSIS — Z791 Long term (current) use of non-steroidal anti-inflammatories (NSAID): Secondary | ICD-10-CM

## 2018-05-30 DIAGNOSIS — I10 Essential (primary) hypertension: Secondary | ICD-10-CM

## 2018-05-30 DIAGNOSIS — Z794 Long term (current) use of insulin: Secondary | ICD-10-CM

## 2018-05-30 DIAGNOSIS — C3490 Malignant neoplasm of unspecified part of unspecified bronchus or lung: Secondary | ICD-10-CM

## 2018-05-30 LAB — CBC WITH DIFFERENTIAL/PLATELET
Abs Immature Granulocytes: 0.02 10*3/uL (ref 0.00–0.07)
Basophils Absolute: 0 10*3/uL (ref 0.0–0.1)
Basophils Relative: 0 %
Eosinophils Absolute: 0.3 10*3/uL (ref 0.0–0.5)
Eosinophils Relative: 5 %
HCT: 39 % (ref 36.0–46.0)
Hemoglobin: 12.4 g/dL (ref 12.0–15.0)
Immature Granulocytes: 0 %
Lymphocytes Relative: 19 %
Lymphs Abs: 0.9 10*3/uL (ref 0.7–4.0)
MCH: 29 pg (ref 26.0–34.0)
MCHC: 31.8 g/dL (ref 30.0–36.0)
MCV: 91.3 fL (ref 80.0–100.0)
Monocytes Absolute: 0.2 10*3/uL (ref 0.1–1.0)
Monocytes Relative: 4 %
Neutro Abs: 3.5 10*3/uL (ref 1.7–7.7)
Neutrophils Relative %: 72 %
Platelets: 243 10*3/uL (ref 150–400)
RBC: 4.27 MIL/uL (ref 3.87–5.11)
RDW: 13.5 % (ref 11.5–15.5)
WBC: 4.9 10*3/uL (ref 4.0–10.5)
nRBC: 0 % (ref 0.0–0.2)

## 2018-05-30 LAB — CMP (CANCER CENTER ONLY)
ALK PHOS: 91 U/L (ref 38–126)
ALT: 10 U/L (ref 0–44)
AST: 21 U/L (ref 15–41)
Albumin: 3.6 g/dL (ref 3.5–5.0)
Anion gap: 10 (ref 5–15)
BUN: 7 mg/dL (ref 6–20)
CALCIUM: 9.1 mg/dL (ref 8.9–10.3)
CO2: 28 mmol/L (ref 22–32)
Chloride: 99 mmol/L (ref 98–111)
Creatinine: 1.08 mg/dL — ABNORMAL HIGH (ref 0.44–1.00)
GFR, Est AFR Am: >60 mL/min (ref >60)
GFR, Estimated: 60 mL/min — ABNORMAL LOW (ref >60)
Glucose, Bld: 343 mg/dL — ABNORMAL HIGH (ref 70–99)
Potassium: 4 mmol/L (ref 3.5–5.1)
Sodium: 137 mmol/L (ref 135–145)
Total Bilirubin: 0.4 mg/dL (ref 0.3–1.2)
Total Protein: 8.3 g/dL — ABNORMAL HIGH (ref 6.5–8.1)

## 2018-05-30 LAB — TSH: TSH: 42.403 u[IU]/mL — ABNORMAL HIGH (ref 0.308–3.960)

## 2018-05-30 LAB — LIPASE, BLOOD: Lipase: 23 U/L (ref 11–51)

## 2018-05-30 LAB — CULTURE, BODY FLUID W GRAM STAIN -BOTTLE: Culture: NO GROWTH

## 2018-05-30 LAB — AMYLASE: Amylase: 50 U/L (ref 28–100)

## 2018-05-30 LAB — CULTURE, BODY FLUID-BOTTLE

## 2018-05-30 MED ORDER — IOHEXOL 300 MG/ML  SOLN
30.0000 mL | Freq: Once | INTRAMUSCULAR | Status: AC | PRN
  Administered 2018-05-30: 30 mL via ORAL

## 2018-05-30 MED ORDER — AMOXICILLIN-POT CLAVULANATE 875-125 MG PO TABS: 1.0000 | ORAL_TABLET | Freq: Two times a day (BID) | ORAL | 0 refills | Status: DC

## 2018-05-30 NOTE — Telephone Encounter (Signed)
Scheduled appt per 2/25 los.  Printed calendar and avs.

## 2018-06-05 ENCOUNTER — Ambulatory Visit: Payer: Self-pay

## 2018-06-05 ENCOUNTER — Ambulatory Visit: Payer: Self-pay | Admitting: Hematology

## 2018-06-05 ENCOUNTER — Other Ambulatory Visit: Payer: Self-pay

## 2018-06-06 ENCOUNTER — Telehealth: Payer: Self-pay | Admitting: *Deleted

## 2018-06-06 NOTE — Telephone Encounter (Signed)
Patient requested results of CT of abdomen. Per Dr. Irene Limbo, no obvious changes in cancer, no areas of concern. Few small gallstones visible. Patient verbalized understanding. Patient states she has some papers in the mail from [place], wants to review them. Encouraged to bring at next appt 3/16, patient states she may come to [facility] sooner.  Patient states MCR started 3/1. She has not yet made appt to see PCP regarding diabetes care/insulin. Encouraged to follow up as soon as possible for this care.  Patient asks if she is to take antibiotics prescribed by Dr. Irene Limbo at last appt 2/25. Advised her to start taking them. Patient verbalized understanding.

## 2018-06-09 ENCOUNTER — Telehealth: Payer: Self-pay | Admitting: *Deleted

## 2018-06-09 NOTE — Telephone Encounter (Signed)
Patient requested assistance with completion/MD signature for [place] form. Patient was given information regarding this from [facility] social work office. Form completed. Patient contacted, left voice mail instructing patient that form left at [facility] reception for pick up as requested. Copy of completed form sent for scanning.

## 2018-06-16 NOTE — Progress Notes (Signed)
[name]    HEMATOLOGY/ONCOLOGY CLINIC NOTE  Date of Service: 06/19/18     Patient Care Team: [name], FNP as PCP - General (Nurse Practitioner) Pixie Casino, MD as PCP - Cardiology (Cardiology)  CHIEF COMPLAINTS  F/u for continued management of metastatic lung adenocarcinoma  Diagnosis Metastatic Lung Adenocarcinoma with rt flank subcutaneous metastasis.  Current treatment:  Nivolumab q2 weeks Palliative RT to pain rt inguinal mass   PreviousTreatment -Concurrent Chemo-radiation with Carboplatin/Taxol. Completed RT on 02/13/2016 and has then completed 2 cycles of carboplatin + taxol. -Started Maintenance Durvalumab from  05/24/2016 q2weeks, switched to Nivolumab q2weeks on 12/13/16 once metastatic disease noted -s/p palliative RT to rt sided Subcutaneous metastatic mass.    HISTORY OF PRESENTING ILLNESS: Plz see my previous note for details on initial presentation.  INTERVAL HISTORY   Marilyn [name] presents to the office today for followup of her metastatic lung cancer. The patient's last visit with [place] was on 05/30/18. She is accompanied today by her mother. The pt reports that she is doing well overall.   The pt reports that she began feeling better overall in the last week. She notes that she just got all of her medications again as her medicaid coverage began. She will be seeing her PCP on 06/22/18. She has been able to begin taking her thyroid medications again, and her blood sugars are now better controlled.  The pt notes that she has gained 8 pounds in the interim. She denies SOB or leg swelling. She notes that her right breast feels bigger to her.  The pt notes that her abdomen is no longer tender, as it was at our last visit. She denies any abdominal pains.  Of note since the patient's last visit, pt has had a CT Abdomen completed on 05/30/18 with results revealing Cholelithiasis, without associated inflammatory changes to suggest acute cholecystitis. Moderate  right pleural effusion. Please note that the pelvis was not imaged.  Lab results today (06/19/18) of CBC w/diff and CMP is as follows: all values are WNL except for HGB at 11.5, Glucose at 249, Creatinine at 1.08, Albumin at 3.4.  On review of systems, pt reports improved energy levels, weight gain, eating well, moving her bowels well, and denies SOB, leg swelling, abdominal pains, abdominal tenderness, and any other symptoms.   MEDICAL HISTORY:  Past Medical History:  Diagnosis Date   Arthritis    Asthma    Diabetes mellitus    Type II   Hemorrhoids    Hypercholesteremia    Hypertension    Hypothyroidism    Metastatic lung cancer (metastasis from lung to other site) (HCC)    Lung, Mets to skin- right flank. 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 mild - EF 45-50% by echo 10/2016 but EF 52% by low risk nuc 11/2016. b. Echo 07/2017 - EF normal, mild AI, unremarkable echo otherwise.   Obese    Pneumonia 2017   Sinus tachycardia    a. dates back to at least 2013, etiology unclear.    SURGICAL HISTORY: Past Surgical History:  Procedure Laterality Date   CESAREAN SECTION     COLONOSCOPY     I/D  arm Right 2013   IR FLUORO GUIDE PORT INSERTION RIGHT  02/18/2017   IR GENERIC HISTORICAL  01/08/2016   IR US GUIDE VASC ACCESS  RIGHT 01/08/2016 [name], DO [facility]   IR GENERIC HISTORICAL  01/08/2016   IR FLUORO GUIDE CV LINE RIGHT 01/08/2016 [name], DO [facility]   IR THORACENTESIS ASP PLEURAL SPACE W/IMG GUIDE  05/25/2018   IR US GUIDE VASC ACCESS RIGHT  02/18/2017   MULTIPLE EXTRACTIONS WITH ALVEOLOPLASTY N/A 03/25/2017   Procedure: MULTIPLE EXTRACTION WITH ALVEOLOPLASTY (teeth #s four, six, seven, eight, nine, ten, eleven, one, twenty-three, twenty-four, twenty-five, twenty-six, twenty-nine, thirty);  Surgeon:  [name], [name];  Location: [facility];  Service: Oral Surgery;  Laterality: N/A;   TUBAL LIGATION     US guided core needle biopsy     of right lower neck/supraclavicular lymph nodes    SOCIAL HISTORY: Social History   Socioeconomic History   Marital status: Divorced    Spouse name: Not on file   Number of children: Not on file   Years of education: Not on file   Highest education level: Not on file  Occupational History   Not on file  Social Needs   Financial resource strain: Not on file   Food insecurity:    Worry: Not on file    Inability: Not on file   Transportation needs:    Medical: No    Non-medical: No  Tobacco Use   Smoking status: Never Smoker   Smokeless tobacco: Never Used  Substance and Sexual Activity   Alcohol use: No   Drug use: No   Sexual activity: Not Currently  Lifestyle   Physical activity:    Days per week: Not on file    Minutes per session: Not on file   Stress: Not on file  Relationships   Social connections:    Talks on phone: Not on file    Gets together: Not on file    Attends religious service: Not on file    Active member of club or organization: Not on file    Attends meetings of clubs or organizations: Not on file    Relationship status: Not on file   Intimate partner violence:    Fear of current or ex partner: No    Emotionally abused: No    Physically abused: No    Forced sexual activity: No  Other Topics Concern   Not on file  Social History Narrative   No bird or mold exposure. No recent travel.   11-23-17 Unable to ask abuse questions mother with her today.    FAMILY HISTORY: Family History  Problem Relation Age of Onset   Heart failure Mother    Hypertension Mother    Cancer Neg Hx    Rheumatologic disease Neg Hx     ALLERGIES:  has No Known Allergies.  MEDICATIONS:  Current Outpatient Medications  Medication Sig Dispense Refill   amoxicillin-clavulanate (AUGMENTIN) 875-125 MG tablet  Take 1 tablet by mouth 2 (two) times daily. 14 tablet 0   ASPIRIN LOW DOSE 81 MG EC tablet TAKE 1 TABLET BY MOUTH EVERY DAY (Patient taking differently: Take 81 mg by mouth daily. Enteric Coated.  ** DO NOT CRUSH **) 60 tablet 2   atorvastatin (LIPITOR) 40 MG tablet Take 1 tablet (40 mg total) by mouth daily at 6 PM. 30 tablet 0   DULoxetine (CYMBALTA) 60 MG capsule TAKE 1 CAPSULE BY MOUTH EVERY DAY (Patient taking differently: Take 60 mg by mouth daily. ) 30 capsule 2   fluconazole (DIFLUCAN) 200 MG tablet Take 1 tablet (200 mg total) by mouth daily. 1 tablet 0  hydrOXYzine (ATARAX/VISTARIL) 10 MG tablet Take 1 tablet (10 mg total) by mouth 3 (three) times daily as needed for itching. 30 tablet 0   insulin aspart (NOVOLOG) 100 UNIT/ML FlexPen As per sliding scale instructions (Patient taking differently: Inject 0-10 Units into the skin 3 (three) times daily. As per sliding scale instructions) 15 mL 2   Insulin Degludec (TRESIBA FLEXTOUCH) 200 UNIT/ML SOPN Inject 100 Units into the skin daily before breakfast.      levalbuterol (XOPENEX HFA) 45 MCG/ACT inhaler Inhale 1-2 puffs into the lungs every 8 (eight) hours as needed for wheezing. 1 Inhaler 12   levothyroxine (SYNTHROID, LEVOTHROID) 150 MCG tablet TAKE 1 TABLET BY MOUTH EVERY DAY BEFORE BREAKFAST (Patient taking differently: Take 150 mcg by mouth daily before breakfast. ) 30 tablet 2   lidocaine-prilocaine (EMLA) cream Apply 1 application topically as needed. 30 g 6   metoprolol succinate (TOPROL-XL) 50 MG 24 hr tablet Take 1 tablet (6m) by mouth in the morning and 1/2 tablet (263m in the evening. Take with or immediately following a meal. (Patient taking differently: Take 25-50 mg by mouth See admin instructions. Take 1 tablet (5065mby mouth in the morning and 1/2 tablet (81m73mn the evening. Take with or immediately following a meal.) 135 tablet 3   naproxen (NAPROSYN) 500 MG tablet Take 1 tablet (500 mg total) by mouth 2 (two)  times daily with a meal. 20 tablet 0   nitrofurantoin, macrocrystal-monohydrate, (MACROBID) 100 MG capsule Take 1 capsule (100 mg total) by mouth every 12 (twelve) hours. 8 capsule 0   oxyCODONE-acetaminophen (PERCOCET) 5-325 MG tablet Take 1 tablet by mouth every 6 (six) hours as needed for moderate pain or severe pain. 50 tablet 0   pregabalin (LYRICA) 50 MG capsule Take 50 mg by mouth 3 (three) times daily.     senna-docusate (SENNA S) 8.6-50 MG tablet Take 2 tablets by mouth 2 (two) times daily. May reduce to 2 tab po HS as needed once regular BM estabished (Patient taking differently: Take 2 tablets by mouth 2 (two) times daily as needed (FOR CONSTIPATION). =) 60 tablet 1   traZODone (DESYREL) 50 MG tablet Take 1-2 tablets (50-100 mg total) by mouth at bedtime as needed for sleep. 60 tablet 0   Vitamins A & D (VITAMIN A & D) OINT Apply 1 application topically 2 (two) times daily. 1 Tube 3   No current facility-administered medications for this visit.    Facility-Administered Medications Ordered in Other Visits  Medication Dose Route Frequency Provider Last Rate Last Dose   sodium chloride 0.9 % injection 10 mL  10 mL Intravenous PRN KaleBrunetta Genera   10 mL at 05/24/16 1252    REVIEW OF SYSTEMS:    A 10+ POINT REVIEW OF SYSTEMS WAS OBTAINED including neurology, dermatology, psychiatry, cardiac, respiratory, lymph, extremities, GI, GU, Musculoskeletal, constitutional, breasts, reproductive, HEENT.  All pertinent positives are noted in the HPI.  All others are negative.   PHYSICAL EXAMINATION: ECOG PERFORMANCE STATUS: 1 - Symptomatic but completely ambulatory  Vitals:   06/19/18 0952  BP: (!) 127/94  Pulse: (!) 117  Resp: 18  Temp: 98.2 F (36.8 C)  SpO2: 96%   Filed Weights   06/19/18 0952  Weight: 284 lb 11.2 oz (129.1 kg)   .Body mass index is 42.04 kg/m.  GENERAL:alert, in no acute distress and comfortable SKIN: no acute rashes, no significant  lesions EYES: conjunctiva are pink and non-injected, sclera anicteric OROPHARYNX: MMM, no exudates,  no oropharyngeal erythema or ulceration NECK: supple, no JVD LYMPH:  no palpable lymphadenopathy in the cervical, axillary or inguinal regions LUNGS: mildly decreased breath sounds 1/3 up right lung base, no rales no rhonchi HEART: regular rate & rhythm ABDOMEN:  normoactive bowel sounds , non tender, not distended. No palpable hepatosplenomegaly.  Extremity: no pedal edema PSYCH: alert & oriented x 3 with fluent speech NEURO: no focal motor/sensory deficits   LABORATORY DATA: .  [name] CBC Latest Ref Rng & Units 06/19/2018 05/30/2018 05/17/2018  WBC 4.0 - 10.5 K/uL 4.3 4.9 4.8  Hemoglobin 12.0 - 15.0 g/dL 11.5(L) 12.4 10.8(L)  Hematocrit 36.0 - 46.0 % 36.5 39.0 34.1(L)  Platelets 150 - 400 K/uL 230 243 263    . CMP Latest Ref Rng & Units 06/19/2018 05/30/2018 05/17/2018  Glucose 70 - 99 mg/dL 249(H) 343(H) 372(H)  BUN 6 - 20 mg/dL '9 7 7  ' Creatinine 0.44 - 1.00 mg/dL 1.08(H) 1.08(H) 0.91  Sodium 135 - 145 mmol/L 139 137 138  Potassium 3.5 - 5.1 mmol/L 4.3 4.0 3.8  Chloride 98 - 111 mmol/L 100 99 100  CO2 22 - 32 mmol/L '27 28 28  ' Calcium 8.9 - 10.3 mg/dL 8.9 9.1 8.5(L)  Total Protein 6.5 - 8.1 g/dL 7.8 8.3(H) 7.5  Total Bilirubin 0.3 - 1.2 mg/dL 0.3 0.4 0.3  Alkaline Phos 38 - 126 U/L 80 91 85  AST 15 - 41 U/L 27 21 12(L)  ALT 0 - 44 U/L '15 10 7               ' 08/18/17 Bx:   08/16/17 Molecular Pathology:    RADIOGRAPHIC STUDIES:  .Ct Abdomen Wo Contrast  Result Date: 05/30/2018 CLINICAL DATA:  Right upper quadrant pain, evaluate for cholecystitis. History of lung cancer, status post XRT and chemotherapy. Immunotherapy stopped today. EXAM: CT ABDOMEN WITHOUT CONTRAST TECHNIQUE: Multidetector CT imaging of the abdomen was performed following the standard protocol without IV contrast. COMPARISON:  CT abdomen/pelvis dated 03/01/2018. Partial comparison to CT chest dated  05/24/2018. FINDINGS: Lower chest: Moderate right pleural effusion, unchanged from recent CT, new from prior CT abdomen/pelvis. Hepatobiliary: Focal fat/altered perfusion along the falciform ligament (series 2/image 33). Cholelithiasis, without associated inflammatory changes to suggest acute cholecystitis. No intrahepatic or extrahepatic ductal dilatation. Pancreas: Within normal limits. Spleen: Within normal limits. Adrenals/Urinary Tract: Adrenal glands are within normal limits. Kidneys are within normal limits. Mild fullness of the right renal collecting system without frank hydronephrosis. Stomach/Bowel: Stomach is within normal limits. Visualized bowel is grossly unremarkable. Vascular/Lymphatic: No evidence of abdominal aortic aneurysm. No suspicious abdominal lymphadenopathy. Other: No abdominal ascites. Musculoskeletal: Mild degenerative changes of the lower thoracic spine. IMPRESSION: Cholelithiasis, without associated inflammatory changes to suggest acute cholecystitis. Moderate right pleural effusion. Please note that the pelvis was not imaged. Electronically Signed   By: Julian Hy M.D.   On: 05/30/2018 15:41   Dg Chest 1 View  Result Date: 05/25/2018 CLINICAL DATA:  Pleural effusion; post thoracentesis. Hx of PNA, NICM, metastatic lung cancer, HTN, DM, AND asthma. Pt is a current smoker. EXAM: CHEST  1 VIEW COMPARISON:  04/03/2018 FINDINGS: No pneumothorax. Small to moderate residual right pleural effusion with consolidation/atelectasis in the right lower lung. Progressive right perihilar masslike consolidation. Left lung clear. Heart size normal. Right IJ port catheter to the mid SVC. Visualized bones unremarkable. IMPRESSION: No pneumothorax post right thoracentesis. Electronically Signed   By: Lucrezia Europe M.D.   On: 05/25/2018 16:35   Ct Chest Wo Contrast  Result Date: 05/24/2018 CLINICAL DATA:  Right lung adenocarcinoma status post chemotherapy and radiation therapy with ongoing  immunotherapy. Worsening dyspnea. History of palliative radiation therapy to the right chest. EXAM: CT CHEST WITHOUT CONTRAST TECHNIQUE: Multidetector CT imaging of the chest was performed following the standard protocol without IV contrast. COMPARISON:  04/03/2018 chest CT angiogram. FINDINGS: Cardiovascular: Normal heart size. No significant pericardial effusion/thickening. Right internal jugular Port-A-Cath terminates in the middle third of the SVC. Left anterior descending coronary atherosclerosis. Atherosclerotic nonaneurysmal thoracic aorta. Normal caliber pulmonary arteries. Mediastinum/Nodes: No discrete thyroid nodules. Unremarkable esophagus. Right axillary adenopathy is decreased, for example a 2.8 cm right axillary node (series 2/image 51), decreased from 3.5 cm. New left supraclavicular adenopathy measuring up to 1.5 cm (series 2/image 9). No left axillary adenopathy. Stable enlarged 1.9 cm right paratracheal node (series 2/image 49). Mildly increased 1.5 cm right retrocrural node (series 2/image 126), previously 1.2 cm. Newly enlarged 1.0 cm left para-aortic posterior mediastinal node (series 2/image 96). No discrete hilar adenopathy on this noncontrast scan. Lungs/Pleura: No pneumothorax. Moderate dependent right pleural effusion, significantly increased. No left pleural effusion. Stable sharply marginated right perihilar consolidation with associated volume loss, distortion and bronchiectasis, compatible with radiation fibrosis. Increased passive atelectasis in the dependent right lung. New patchy nodular consolidation in the peripheral right middle lobe measuring up to 2.1 cm (series 5/image 64). Patchy finely nodular thickening of peripheral peribronchovascular interstitium and interlobular septa in the lower lungs bilaterally, similar. Upper abdomen: Vague low-attenuation subcapsular foci adjacent to the intersegmental fissure and at the left liver dome are unchanged since 08/03/2017 PET-CT, where  they were non FDG avid, most compatible with benign focal fat. Musculoskeletal: No aggressive appearing focal osseous lesions. Moderate thoracic spondylosis. Increased nonspecific asymmetric skin thickening throughout the right breast. IMPRESSION: 1. Mixed interval change. 2. Moderate dependent right pleural effusion, significantly increased. 3. Right axillary adenopathy is decreased. 4. New left supraclavicular and left posterior mediastinal lymphadenopathy. Increased right retrocrural adenopathy. These findings are worrisome for progression of metastatic nodal disease. 5. New mild patchy nodular consolidation in the peripheral right middle lobe, which could be inflammatory or neoplastic. 6. Similar finely nodular patchy thickening of the peripheral peribronchovascular interstitium and interlobular septa in the lower lungs bilaterally, nonspecific, lymphangitic tumor not excluded. 7. Nonspecific increased right breast skin thickening, possibly treatment related. Aortic Atherosclerosis (ICD10-I70.0). These results will be called to the ordering clinician or representative by the Radiologist Assistant, and communication documented in the PACS or zVision Dashboard. Electronically Signed   By: Ilona Sorrel M.D.   On: 05/24/2018 17:12   Ir Thoracentesis Asp Pleural Space W/img Guide  Result Date: 05/25/2018 INDICATION: Right-sided lung cancer. Shortness of breath. Right-sided pleural effusion. Request for diagnostic and therapeutic thoracentesis. EXAM: ULTRASOUND GUIDED RIGHT THORACENTESIS MEDICATIONS: None. COMPLICATIONS: None immediate. PROCEDURE: An ultrasound guided thoracentesis was thoroughly discussed with the patient and questions answered. The benefits, risks, alternatives and complications were also discussed. The patient understands and wishes to proceed with the procedure. Written consent was obtained. Ultrasound was performed to localize and mark an adequate pocket of fluid in the right chest. The area  was then prepped and draped in the normal sterile fashion. 1% Lidocaine was used for local anesthesia. Under ultrasound guidance a 6 Fr Safe-T-Centesis catheter was introduced. Thoracentesis was performed. The catheter was removed and a dressing applied. FINDINGS: A total of approximately 270 mL of clear yellow fluid was removed. Samples were sent to the laboratory as requested by the clinical team. IMPRESSION: Successful  ultrasound guided right thoracentesis yielding 270 mL of pleural fluid. Read by: Ascencion Dike PA-C No pneumothorax on follow-up chest radiograph. Electronically Signed   By: Lucrezia Europe M.D.   On: 05/25/2018 16:21     ASSESSMENT & PLAN:   53 y.o.  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  -Noted to have a small mass in the left adrenal on CT but PET/CT neg for metastatic disease. Patient has been a lifelong nonsmoker -MRI of the brain was negative for any metastatic disease. At diagnosis. -High PDL1 expression (90%) on [facility] One Neg for EGFR, ALK, ROS-1 and BRAF mutations. -Patient completed her planned definitive chemoradiation with Carbo Taxol on 02/13/2016. No prohibitive toxicities other than some grade 1 skin desquamation and some grade 1-2 radiation esophagitis. She has subsequently received 2 out of 2 planned dose of carboplatin + Taxol. -CTA chest 10/29/2016  no evidence of lung cancer progression in the chest. No PE -CT abd/pelvis-10/29/2016  Interval 5.0 x 5.0 x 4.7 cm mass in the right lateral subcutaneous fat at the level of the mid abdomen. This has CT features compatible with a metastasis or primary neoplasm. -CT C/A/P (05/12/2017): Interval  development of right axillary, right external iliac and right inguinal adenopathy. Suspicious for nodal metastasis. 2. Decrease in size of right paratracheal adenopathy. 3. Decrease in size and enhancement associated with right lateral body wall lesion. 4. New small nonspecific pulmonary nodule in the left lower lobe measuring 6 mm. 5. Stable appearance of changes secondary to external beam radiation within the right lung  08/03/17 PET which revealed 2.8 cm right axillary lymph node is markedly hypermetabolic and consistent with metastatic disease. Extensive radiation changes involving the right paramediastinal lung. There is a focus of hypermetabolism in the right lower lobe which is suspicious for residual or recurrent tumor.  11/11/17 CT C/A/P revealed There has been interval increase in number of enlarged of right axillary nodes and increase in size of right inguinal adenopathy compatible with progression of disease. Stable appearance of the right lung with changes of external beam radiation.    03/01/18 CT C/A/P revealed Bulky right subpectoral/axillary adenopathy, markedly progressive from 11/11/2017. Enlarging retrocrural lymph node. Low right paratracheal adenopathy is stable. 2. Interval resection of a right inguinal nodal mass. Stable right external iliac lymph node. 3. Cholelithiasis.  Stable common bile duct dilatation. 4.  Aortic atherosclerosis.  she received maintenance Durvalumab from  05/24/2016 q2weeks, switched to Nivolumab q2weeks on 12/13/16 in the setting of Biopsy proven isolated metastatic disease. Given the tumor is strongly PDL1 positive and patient has only isolated metastatic disease was treated with palliative RT to metastases with significant improvement. Molecular pathology from 515/19 did not reveal obviously targetable mutations with NF1 and TP53 mutations present.  05/24/18 CT Chest revealed Mixed interval change. 2. Moderate dependent right pleural effusion, significantly  increased. 3. Right axillary adenopathy is decreased. 4. New left supraclavicular and left posterior mediastinal lymphadenopathy. Increased right retrocrural adenopathy. These findings are worrisome for progression of metastatic nodal disease. 5. New mild patchy nodular consolidation in the peripheral right middle lobe, which could be inflammatory or neoplastic. 6. Similar finely nodular patchy thickening of the peripheral peribronchovascular interstitium and interlobular septa in the lower  lungs bilaterally, nonspecific, lymphangitic tumor not excluded. 7. Nonspecific increased right breast skin thickening, possibly treatment related. Aortic Atherosclerosis.  Plan:  -Discussed pt labwork today, 06/19/18; HGB a little lower at 11.5, other blood counts and chemistries are stable. -Discussed the 05/30/18 CT Abdomen which revealed Cholelithiasis, without associated inflammatory changes to suggest acute cholecystitis. Moderate right pleural effusion. Please note that the pelvis was not imaged. -Discussed again my concern that her cancer has progressed and the options of combination chemotherapy regimen including Avastin vs comfort measures. -The pt prefers to wait one month before deciding what she wants to do. I discussed that this could allow for further progression, which she understands. She will call my office or present to the ED if she develops new or worsening SOB. -Advised crowd avoidance and infection prevention strategies -Pt had 223m clear yellow fluid removed on 05/25/18 thoracentesis -Radiographic findings related to cancer progression vs infection vs inflammation -Radiated lymph nodes have reduced in size, however new lymph node progression is also noted -Holding Nivolumab -Did begin empiric antibiotics for possible infection -Home O2, and Social work referral -Continue with outpatient PT -Vitamin A&D ointment and Hydorxyzine for vaginal itching -Advised that pt continue follow up with her  cardiologist -Follow up with PCP TSelina Cooley FNP for DM management -Will see the pt back in 1 month  #3 h/o radiation pneumonitis - no new symptoms. Does has some cough with exertion but this has improved.  #4 Improving fatigue (grade 1) - improved fatigue with levothyroxine compliance with near normalization of FT4 levels.  - previously given TSH of 12 -- increased Levolthyroxine from 1259m to 15014m ---will likely need larger dose but in the setting of baseline tachycardia we are gradually increasing dose to avoid ppt cardiac arrhythmias.  TSH elevated due to medication non compliance.  #5 normocytic anemia related to chemotherapy -resolving and stable  #6 Left chest wall pain due to costochondritis - mx with pain medications. CTA x 3 neg for PE. No significant pain at this time.  #7 Sinus tachycardia - on BB per cards , no PE on CTA x 3 , -Still having sinus tachycardia . Partly from deconditioning and anemia. Seeing Dr. HilDebara Pickettom cardiology  -Continues to be on BB  #8 neoplasm related pain is currently controlled without significant medications. Clinically likely has sleep apnea . Would need to be careful with sedative medications . 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 The patient knows to call the clinic with any problems, questions or concerns.  The total time spent in the appt was 30 minutes and more than 50% was on counseling and direct patient cares.   GautaSullivan LoneS AAClaremontVMS SCH CRussell County HospitalHMae [facility] LLCtology/Oncology Physician [facility] Hospitalfice):       [phone] cell):   [phone]           [phone] Jamaicaacting as a scribe for Dr. GautaSullivan LoneI have reviewed the above documentation for accuracy and completeness, and I agree with the above.  .GautBrunetta Genera

## 2018-06-19 ENCOUNTER — Other Ambulatory Visit: Payer: Self-pay

## 2018-06-19 ENCOUNTER — Inpatient Hospital Stay: Payer: Medicare Other | Attending: Hematology

## 2018-06-19 ENCOUNTER — Inpatient Hospital Stay (HOSPITAL_BASED_OUTPATIENT_CLINIC_OR_DEPARTMENT_OTHER): Payer: Medicare Other | Admitting: Hematology

## 2018-06-19 ENCOUNTER — Telehealth: Payer: Self-pay | Admitting: Hematology

## 2018-06-19 ENCOUNTER — Ambulatory Visit: Payer: Self-pay

## 2018-06-19 VITALS — BP 127/94 | HR 117 | Temp 98.2°F | Resp 18 | Ht 69.0 in | Wt 284.7 lb

## 2018-06-19 DIAGNOSIS — G893 Neoplasm related pain (acute) (chronic): Secondary | ICD-10-CM

## 2018-06-19 DIAGNOSIS — D649 Anemia, unspecified: Secondary | ICD-10-CM

## 2018-06-19 DIAGNOSIS — E7800 Pure hypercholesterolemia, unspecified: Secondary | ICD-10-CM | POA: Diagnosis not present

## 2018-06-19 DIAGNOSIS — G4700 Insomnia, unspecified: Secondary | ICD-10-CM

## 2018-06-19 DIAGNOSIS — E119 Type 2 diabetes mellitus without complications: Secondary | ICD-10-CM | POA: Insufficient documentation

## 2018-06-19 DIAGNOSIS — Z9221 Personal history of antineoplastic chemotherapy: Secondary | ICD-10-CM | POA: Insufficient documentation

## 2018-06-19 DIAGNOSIS — E669 Obesity, unspecified: Secondary | ICD-10-CM | POA: Diagnosis not present

## 2018-06-19 DIAGNOSIS — J45909 Unspecified asthma, uncomplicated: Secondary | ICD-10-CM

## 2018-06-19 DIAGNOSIS — Z923 Personal history of irradiation: Secondary | ICD-10-CM

## 2018-06-19 DIAGNOSIS — Z794 Long term (current) use of insulin: Secondary | ICD-10-CM | POA: Diagnosis not present

## 2018-06-19 DIAGNOSIS — G629 Polyneuropathy, unspecified: Secondary | ICD-10-CM

## 2018-06-19 DIAGNOSIS — C3491 Malignant neoplasm of unspecified part of right bronchus or lung: Secondary | ICD-10-CM | POA: Insufficient documentation

## 2018-06-19 DIAGNOSIS — M129 Arthropathy, unspecified: Secondary | ICD-10-CM | POA: Diagnosis not present

## 2018-06-19 DIAGNOSIS — E1165 Type 2 diabetes mellitus with hyperglycemia: Secondary | ICD-10-CM | POA: Diagnosis not present

## 2018-06-19 DIAGNOSIS — E039 Hypothyroidism, unspecified: Secondary | ICD-10-CM | POA: Insufficient documentation

## 2018-06-19 DIAGNOSIS — I10 Essential (primary) hypertension: Secondary | ICD-10-CM | POA: Diagnosis not present

## 2018-06-19 DIAGNOSIS — Z5112 Encounter for antineoplastic immunotherapy: Secondary | ICD-10-CM | POA: Diagnosis not present

## 2018-06-19 DIAGNOSIS — Z79899 Other long term (current) drug therapy: Secondary | ICD-10-CM

## 2018-06-19 DIAGNOSIS — J90 Pleural effusion, not elsewhere classified: Secondary | ICD-10-CM

## 2018-06-19 DIAGNOSIS — R1011 Right upper quadrant pain: Secondary | ICD-10-CM

## 2018-06-19 LAB — CMP (CANCER CENTER ONLY)
ALK PHOS: 80 U/L (ref 38–126)
ALT: 15 U/L (ref 0–44)
AST: 27 U/L (ref 15–41)
Albumin: 3.4 g/dL — ABNORMAL LOW (ref 3.5–5.0)
Anion gap: 12 (ref 5–15)
BUN: 9 mg/dL (ref 6–20)
CHLORIDE: 100 mmol/L (ref 98–111)
CO2: 27 mmol/L (ref 22–32)
Calcium: 8.9 mg/dL (ref 8.9–10.3)
Creatinine: 1.08 mg/dL — ABNORMAL HIGH (ref 0.44–1.00)
GFR, Est AFR Am: >60 mL/min (ref >60)
GFR, Estimated: 60 mL/min — ABNORMAL LOW (ref >60)
Glucose, Bld: 249 mg/dL — ABNORMAL HIGH (ref 70–99)
Potassium: 4.3 mmol/L (ref 3.5–5.1)
Sodium: 139 mmol/L (ref 135–145)
Total Bilirubin: 0.3 mg/dL (ref 0.3–1.2)
Total Protein: 7.8 g/dL (ref 6.5–8.1)

## 2018-06-19 LAB — CBC WITH DIFFERENTIAL/PLATELET
Abs Immature Granulocytes: 0.01 10*3/uL (ref 0.00–0.07)
Basophils Absolute: 0 10*3/uL (ref 0.0–0.1)
Basophils Relative: 1 %
Eosinophils Absolute: 0.2 10*3/uL (ref 0.0–0.5)
Eosinophils Relative: 5 %
HCT: 36.5 % (ref 36.0–46.0)
HEMOGLOBIN: 11.5 g/dL — AB (ref 12.0–15.0)
Immature Granulocytes: 0 %
LYMPHS PCT: 25 %
Lymphs Abs: 1.1 10*3/uL (ref 0.7–4.0)
MCH: 29.1 pg (ref 26.0–34.0)
MCHC: 31.5 g/dL (ref 30.0–36.0)
MCV: 92.4 fL (ref 80.0–100.0)
Monocytes Absolute: 0.3 10*3/uL (ref 0.1–1.0)
Monocytes Relative: 6 %
Neutro Abs: 2.7 10*3/uL (ref 1.7–7.7)
Neutrophils Relative %: 63 %
Platelets: 230 10*3/uL (ref 150–400)
RBC: 3.95 MIL/uL (ref 3.87–5.11)
RDW: 14.1 % (ref 11.5–15.5)
WBC: 4.3 10*3/uL (ref 4.0–10.5)
nRBC: 0 % (ref 0.0–0.2)

## 2018-06-19 NOTE — Telephone Encounter (Signed)
Gave patient avs report and appointments for April  °

## 2018-06-22 ENCOUNTER — Other Ambulatory Visit: Payer: Self-pay | Admitting: Hematology

## 2018-06-23 ENCOUNTER — Ambulatory Visit: Payer: Medicare Other | Admitting: Physical Therapy

## 2018-06-24 IMAGING — CT CT ANGIO CHEST
2 of 6 series · 17 of 46 positions shown · IV contrast (iopamidol)
Comparison: Numerous prior CT scans of the chest, most recent prior
CT PE study 06/21/2017.

CLINICAL DATA: 51-year-old female with a history of lung cancer and
new onset chest pain concerning for pulmonary embolus.

EXAM:
CT ANGIOGRAPHY CHEST WITH CONTRAST
TECHNIQUE: Multidetector CT imaging of the chest was performed using the
standard protocol during bolus administration of intravenous
contrast. Multiplanar CT image reconstructions and MIPs were
obtained to evaluate the vascular anatomy.
CONTRAST:  61mL 7JRZVP-1ZI IOPAMIDOL (7JRZVP-1ZI) INJECTION 76%

[Series 5: thins · axial · 0.76mm/px · z∈[-263,-30]mm · 14 of 257 slices shown]
[im 12/257  lung]
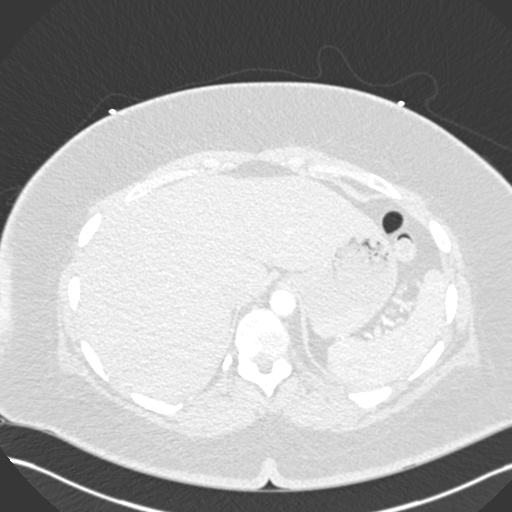
[im 34/257  soft-tissue]
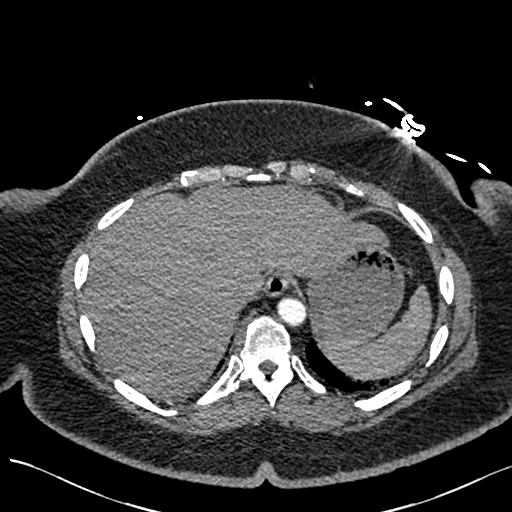
[im 45/257  lung]
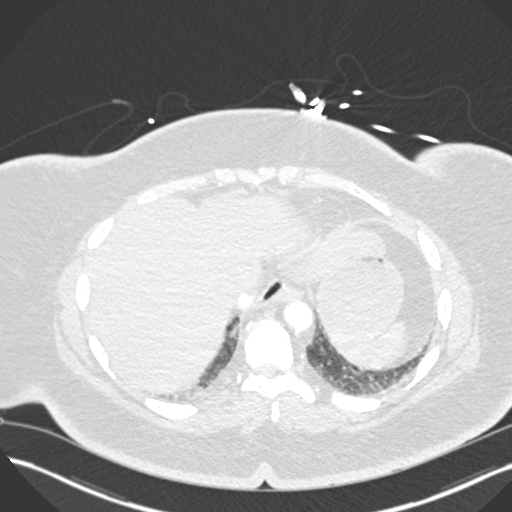
[im 67/257  soft-tissue]
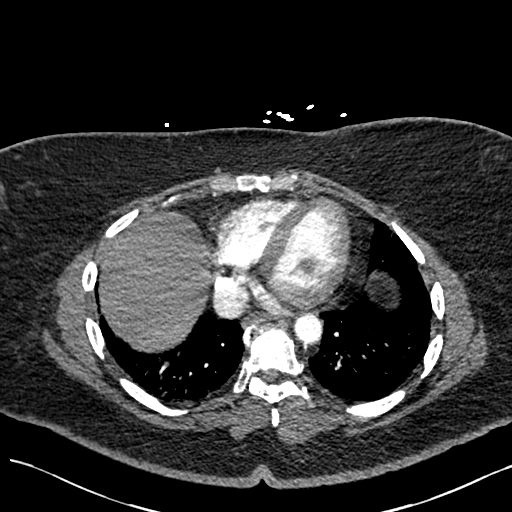
[im 90/257  lung]
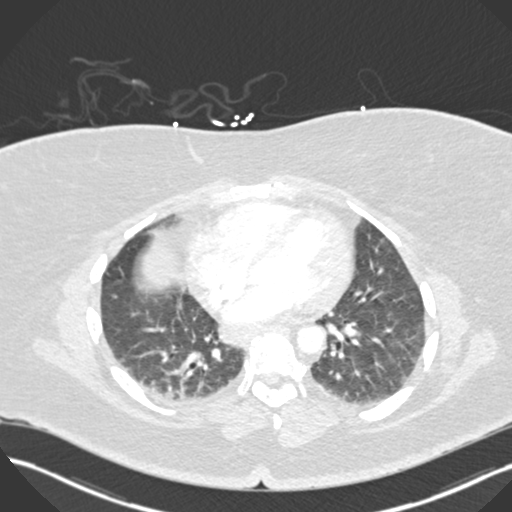
[im 101/257  soft-tissue]
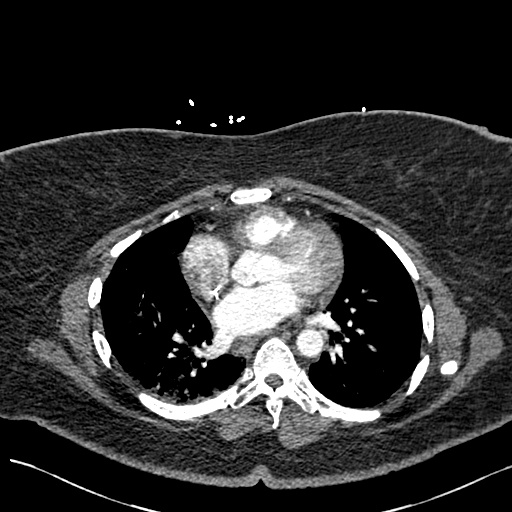
[im 123/257  lung]
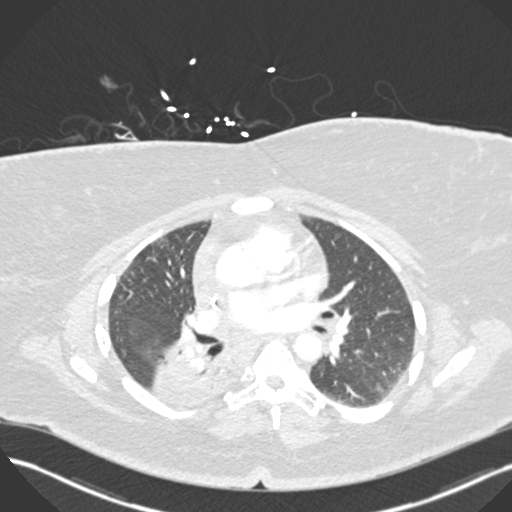
[im 134/257  soft-tissue]
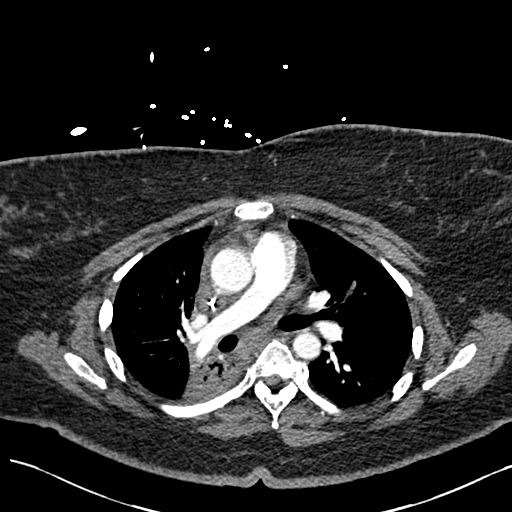
[im 156/257  lung]
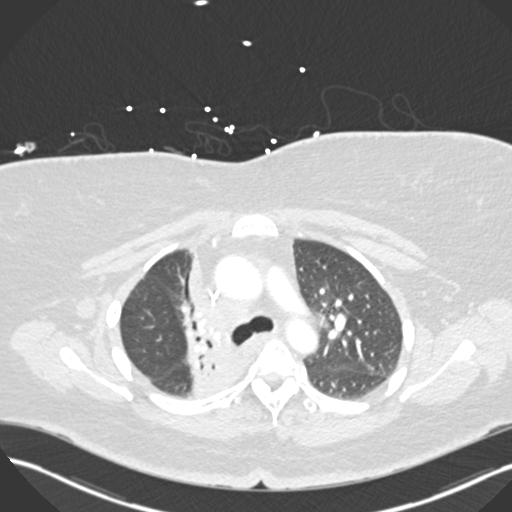
[im 167/257  soft-tissue]
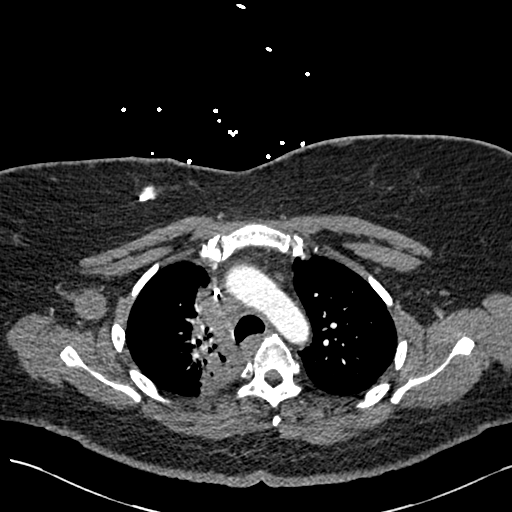
[im 190/257  lung]
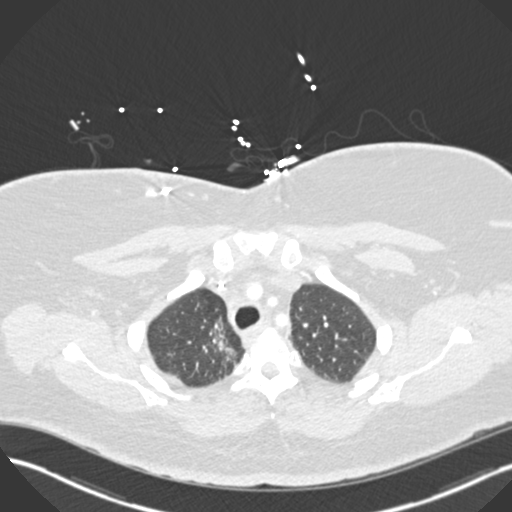
[im 212/257  soft-tissue]
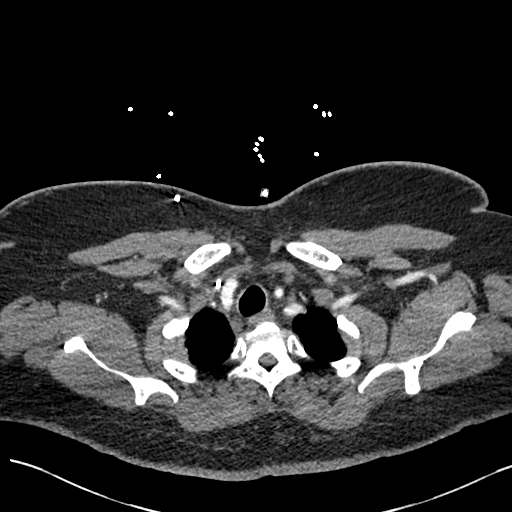
[im 223/257  lung]
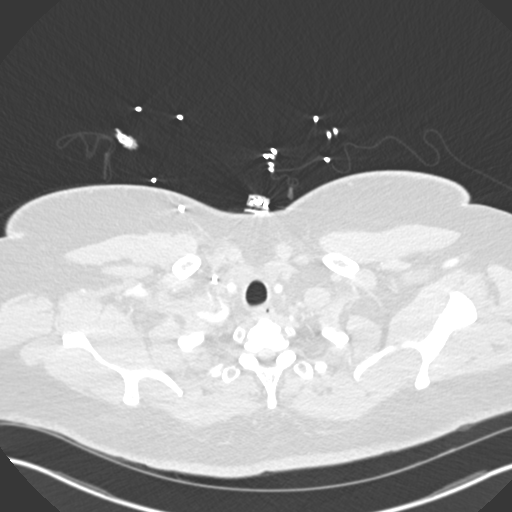
[im 245/257  soft-tissue]
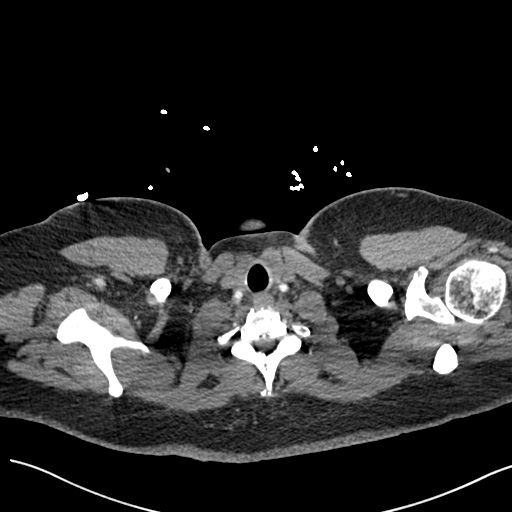

[Series 6: coronal mpr · coronal · 0.53mm/px · 3 of 156 slices shown]
[im 39/156  soft-tissue]
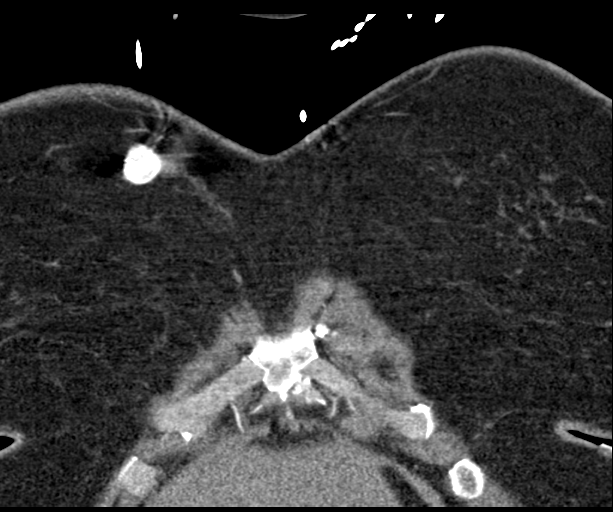
[im 78/156  soft-tissue]
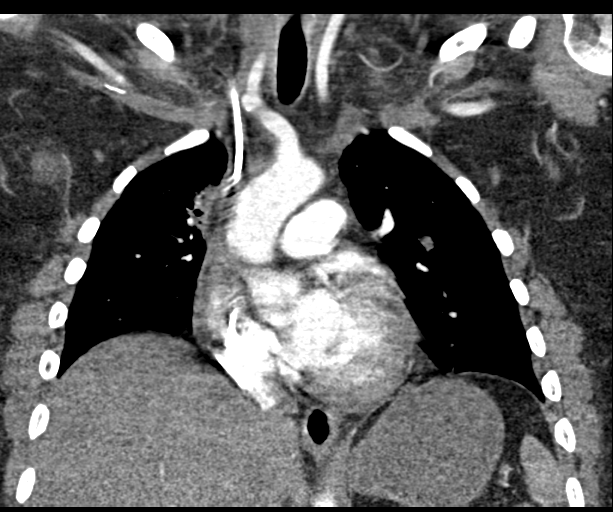
[im 117/156  soft-tissue]
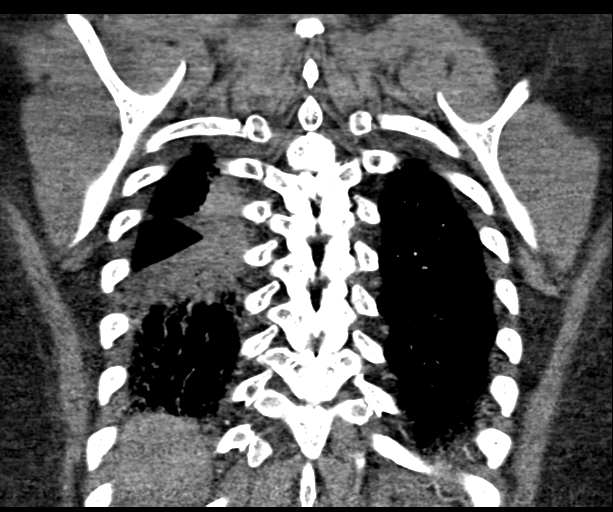

[17 of 46 positions shown; findings below may reference images not displayed]

FINDINGS: Cardiovascular: Adequate opacification of the pulmonary arteries to
the proximal subsegmental level. No evidence of central filling
defect to suggest acute pulmonary embolus. Conventional 3 vessel
arch anatomy. No evidence of aortic dissection or aneurysm. There is
a right IJ approach port catheter. The catheter tip terminates in
the right atrium. The heart is normal in size. No pericardial
effusion.

Mediastinum/Nodes: Stable low right paratracheal nodal mass
measuring 2.2 x 2.6 cm (previously 2.0 x 2.5 cm). Slight interval
difference may be due to measurement technique rather than true
interval growth. There is a large right axillary lymph node
measuring 2.7 by 2.9 cm compared to 2.5 x 2.5 cm previously.
Unremarkable thoracic esophagus.

Lungs/Pleura: Significant atelectasis/scarring and cicatrization of
the medial aspect of the right upper and middle lobes consistent
with prior radiation changes. This is stable across multiple prior
studies. Mild dependent atelectasis is present in both lower lobes,
also similar compared to prior studies. Essentially unchanged 6 mm
nodule in the left lower lobe dating back to at least 05/11/2017. No
pleural effusion or pneumothorax.

Upper Abdomen: No acute abnormality.

Musculoskeletal: No chest wall abnormality. No acute or significant
osseous findings.

Review of the MIP images confirms the above findings.
IMPRESSION: 1. Negative for acute pulmonary embolus, pneumonia or other acute
cardiopulmonary process.
2. Stable to incremental enlargement of the likely metastatic right
axillary lymph node which now measures 2.7 x 2.9 cm compared to
x 2.6 cm on 06/21/2017. No additional evidence of disease
progression or other metastatic disease.
3. Stable to incremental enlargement of low right paratracheal nodal
mass.
4. Stable post radiation changes with scarring/fibrosis in the
medial aspects of the right upper and middle lobes confluent with
the right hilum.
5. Similar degree of mild bibasilar subsegmental atelectasis
compared to prior studies.
6. Stable left lower lobe pulmonary nodule.

## 2018-06-24 IMAGING — CR DG CHEST 2V
2 series · 2 of 2 positions shown · non-contrast
Comparison: Chest 06/21/2017.  CT chest 06/21/2017

CLINICAL DATA: Lung cancer. Increasing shortness of breath and
fever.

EXAM:
CHEST - 2 VIEW

[w chest lat]
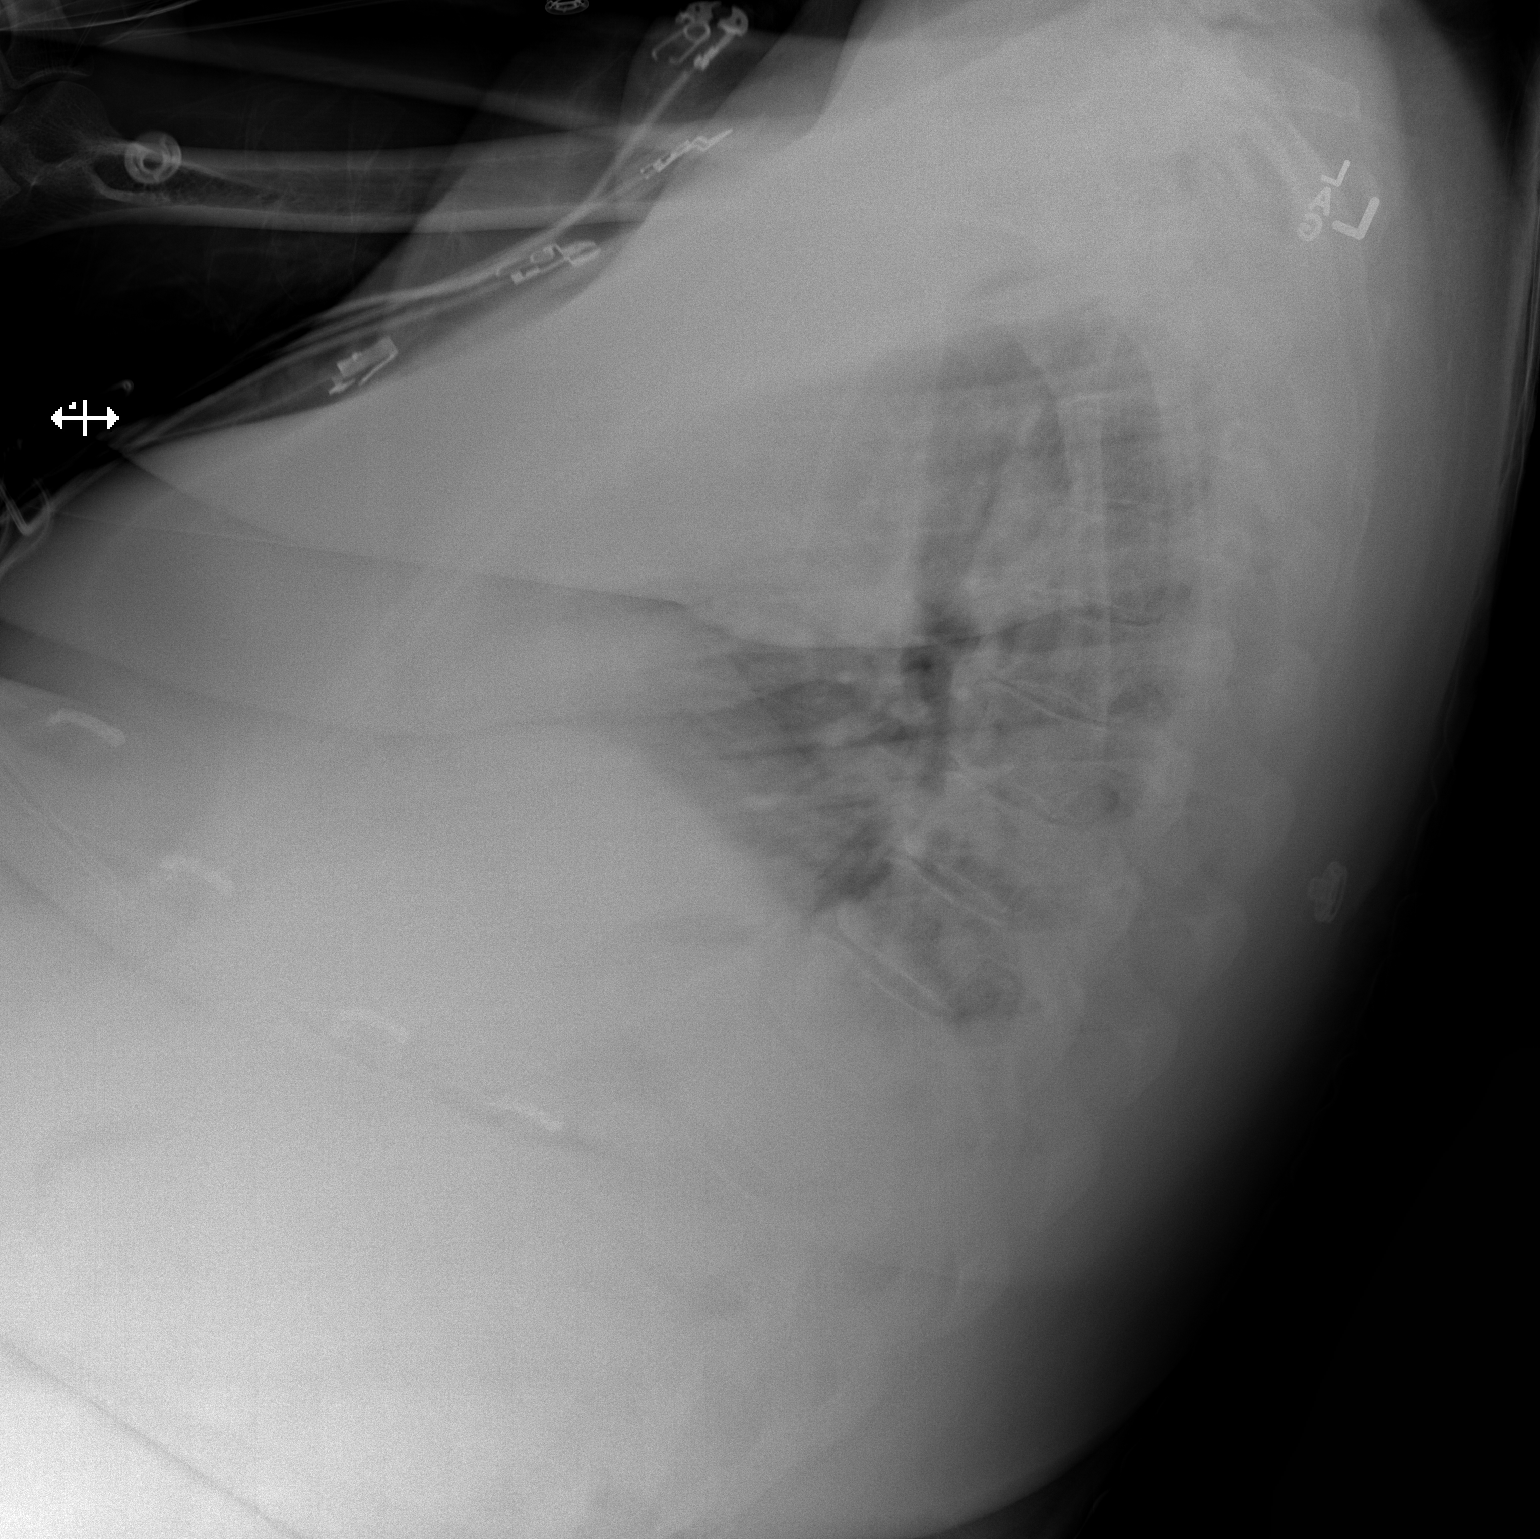

[x chest ap]
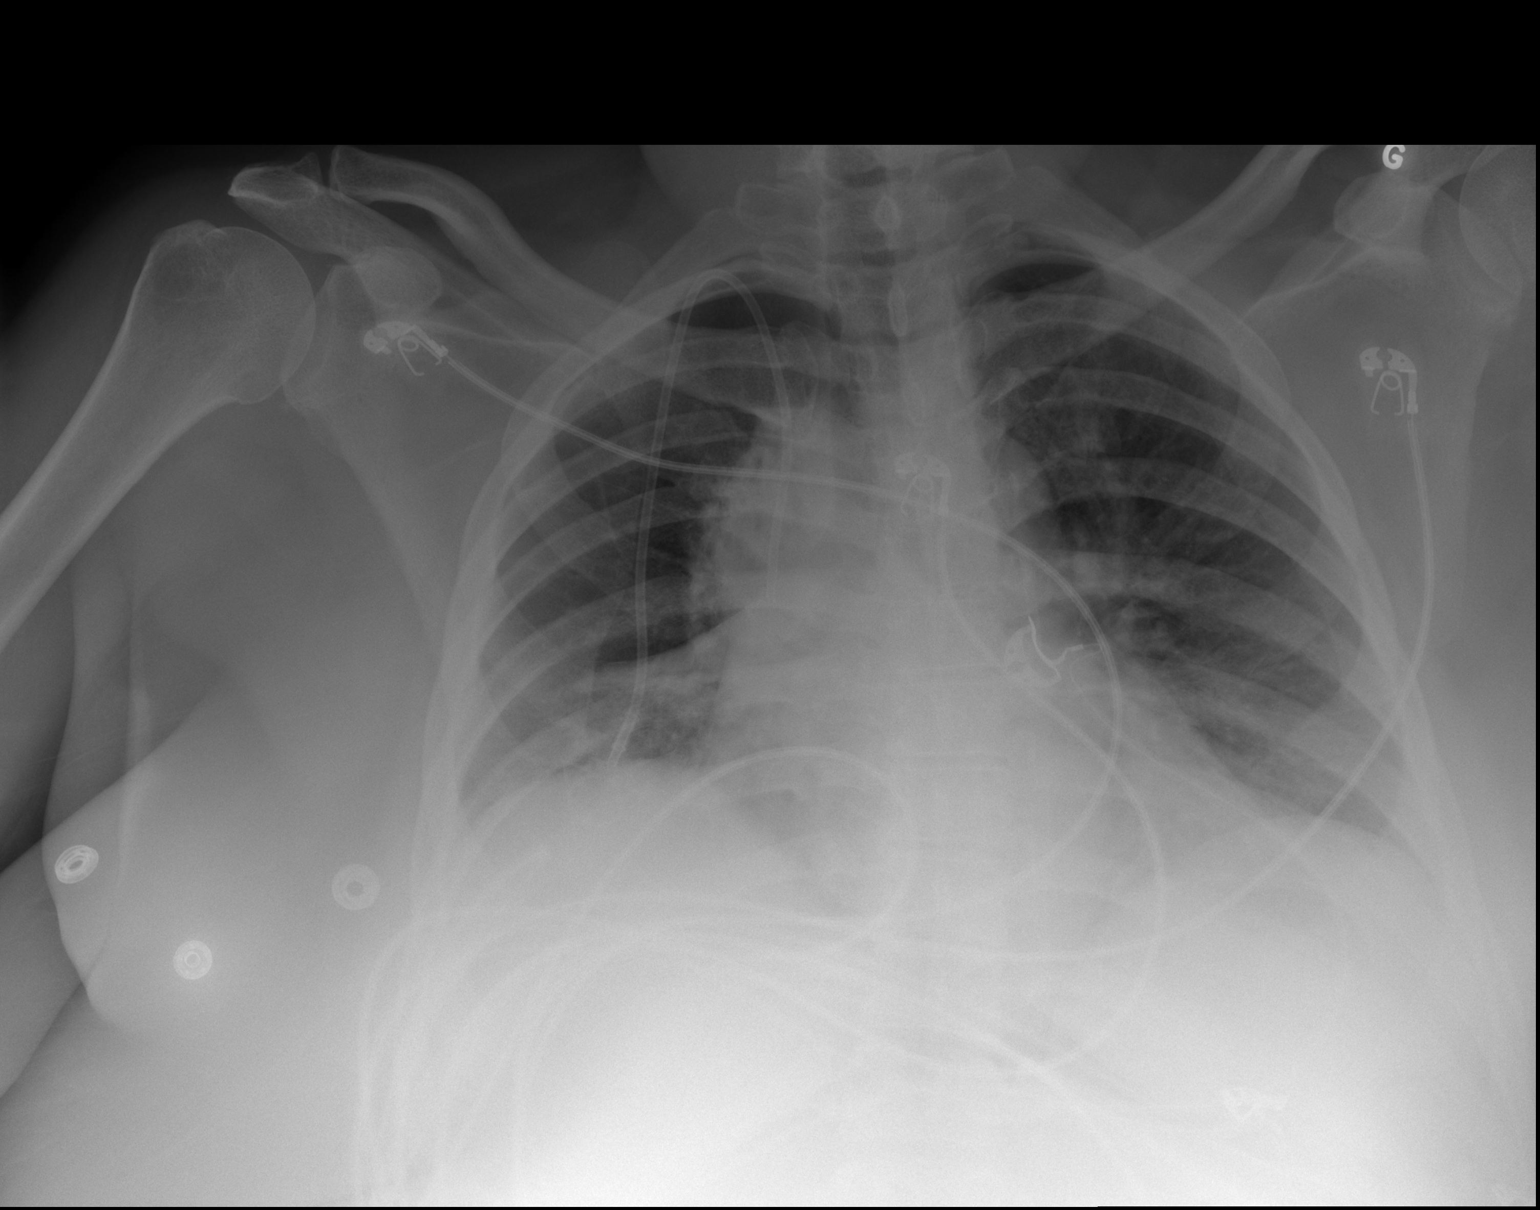

[2 of 2 positions shown; findings below may reference images not displayed]

FINDINGS: Shallow inspiration. Linear atelectasis in the lung bases. Right
suprahilar/paratracheal fullness is unchanged since previous study.
This corresponds to medial consolidation on previous CT, likely due
to postradiation changes. No acute consolidation or airspace disease
is appreciated. There is blunting of the costophrenic angles which
may represent small pleural effusions versus pleural thickening. No
pneumothorax. Heart size and pulmonary vascularity are normal.
IMPRESSION: Chronic right hilar/right paratracheal changes similar to previous
study. Shallow inspiration with atelectasis in the lung bases. No
significant change.

## 2018-06-25 IMAGING — CT CT ABD-PELV W/O CM
2 of 4 series · 15 of 46 positions shown, 17 images · non-contrast
Comparison: Chest, abdomen and pelvis CT, 05/11/2016.

CLINICAL DATA: chief complaint of chest pain. Patient has history
of CHF, diabetes, lung cancer -with metastases to right flank.
[REDACTED]/ Pain initially was in the right flank but then also moved
anteriorly. Pain is described as "sharp and grabbing pain".

EXAM:
CT ABDOMEN AND PELVIS WITHOUT CONTRAST
TECHNIQUE: Multidetector CT imaging of the abdomen and pelvis was performed
following the standard protocol without IV contrast.

[Series 2: axial st · axial · 0.98mm/px · z∈[-501,-61]mm · 12 of 98 slices shown, 14 images]
[im 5/98  soft-tissue]
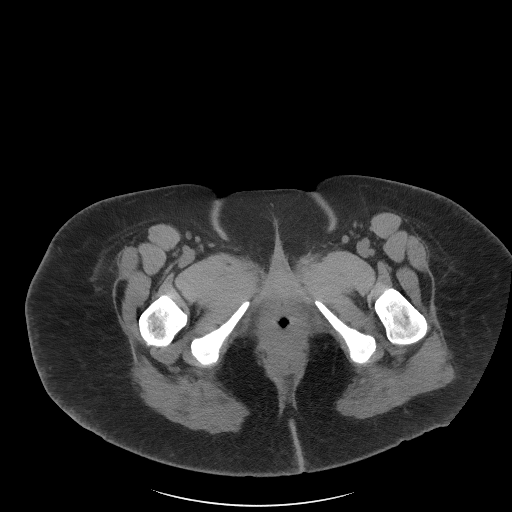
[im 5/98  bone]
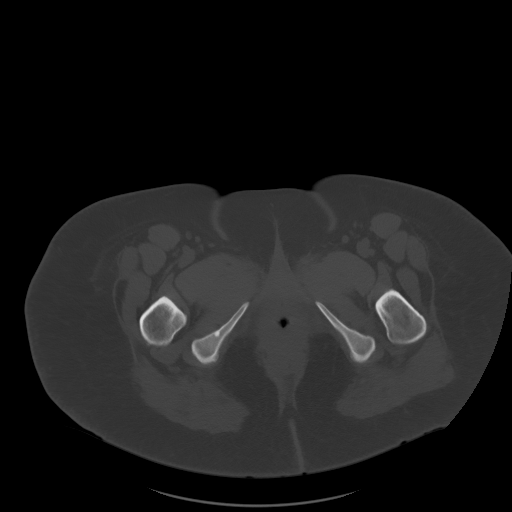
[im 14/98  soft-tissue]
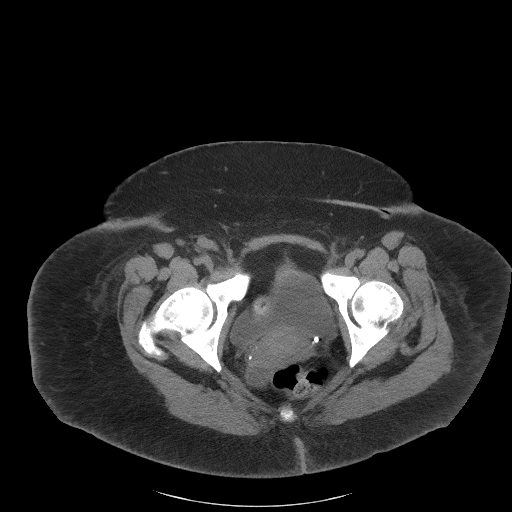
[im 24/98  soft-tissue]
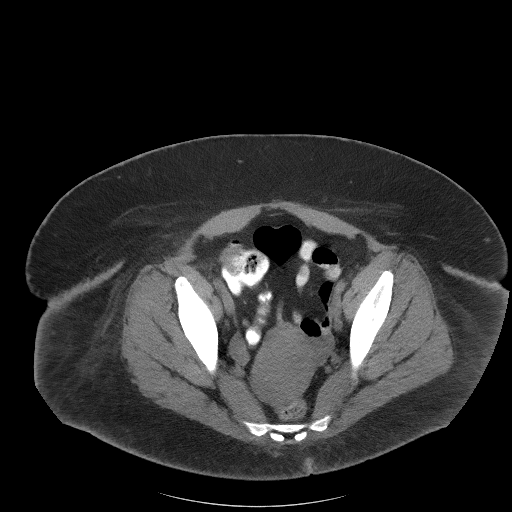
[im 28/98  soft-tissue]
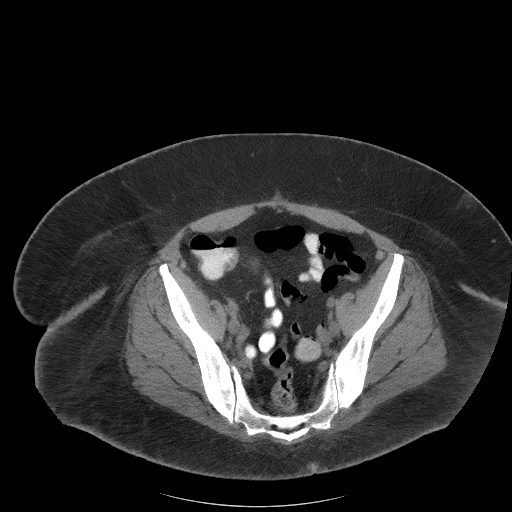
[im 37/98  soft-tissue]
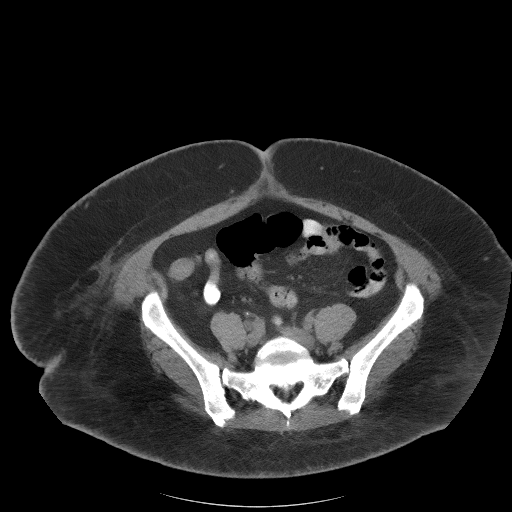
[im 47/98  soft-tissue]
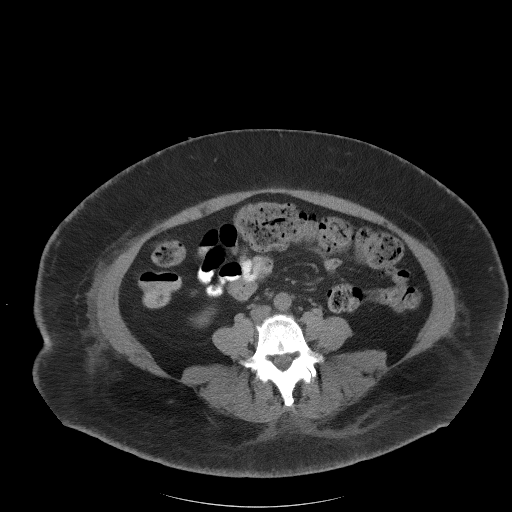
[im 51/98  soft-tissue]
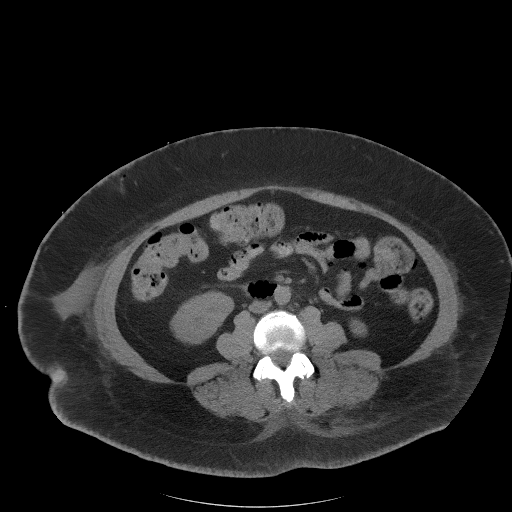
[im 61/98  soft-tissue]
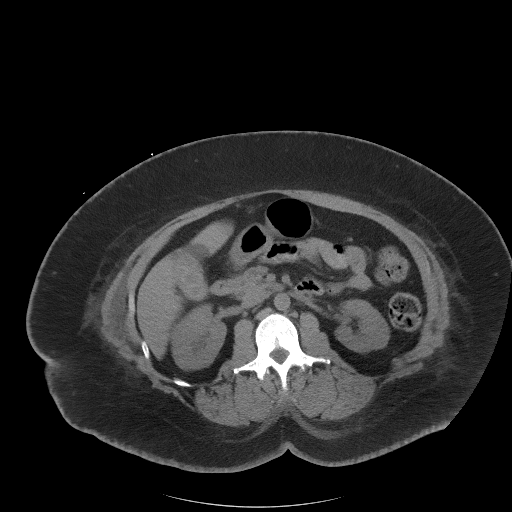
[im 70/98  soft-tissue]
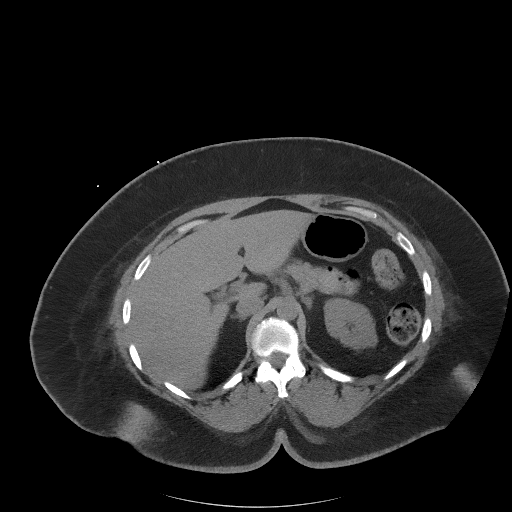
[im 70/98  bone]
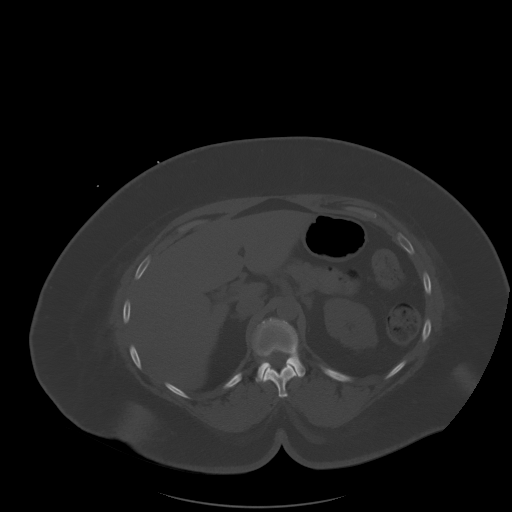
[im 74/98  soft-tissue]
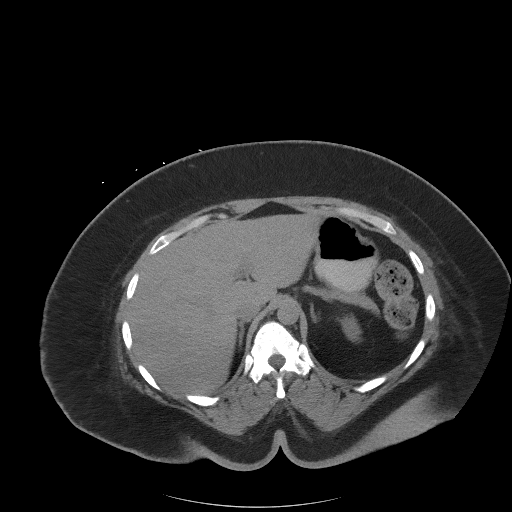
[im 84/98  soft-tissue]
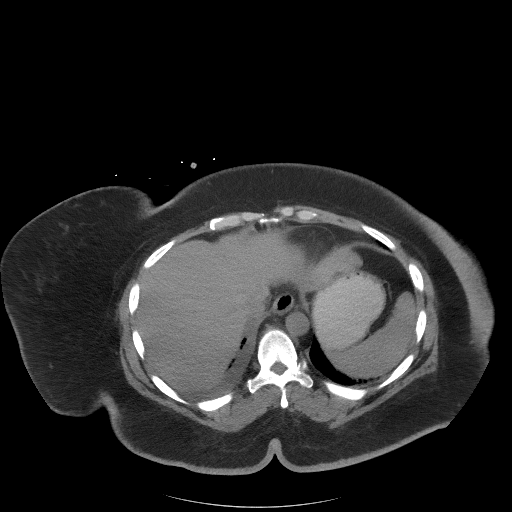
[im 93/98  soft-tissue]
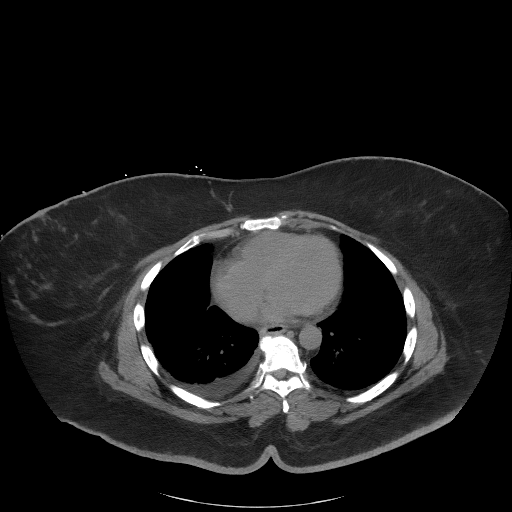

[Series 5: coronal st · coronal · 0.86mm/px · 3 of 107 slices shown]
[im 36/107  soft-tissue]
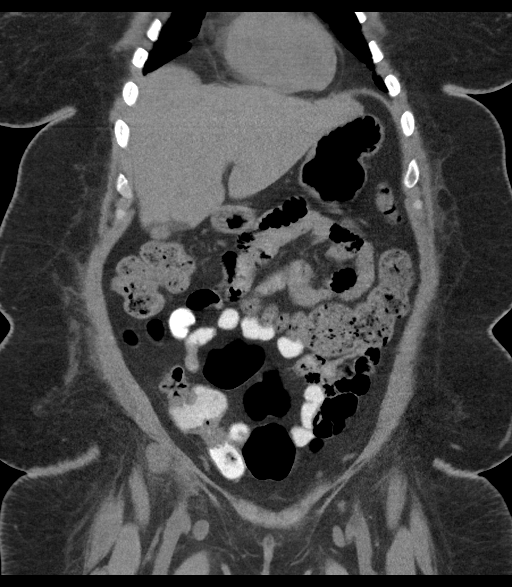
[im 48/107  soft-tissue]
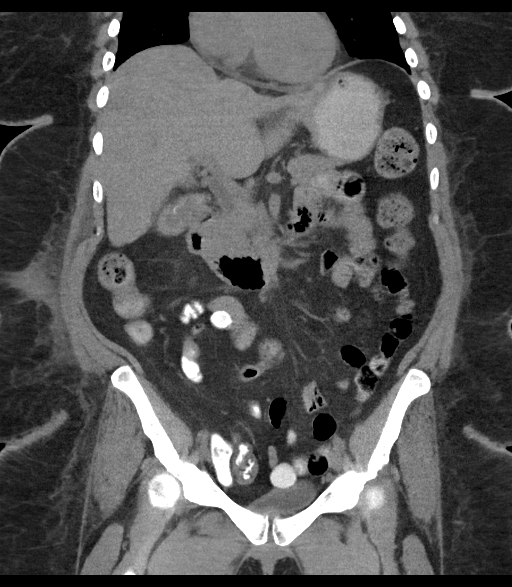
[im 59/107  soft-tissue]
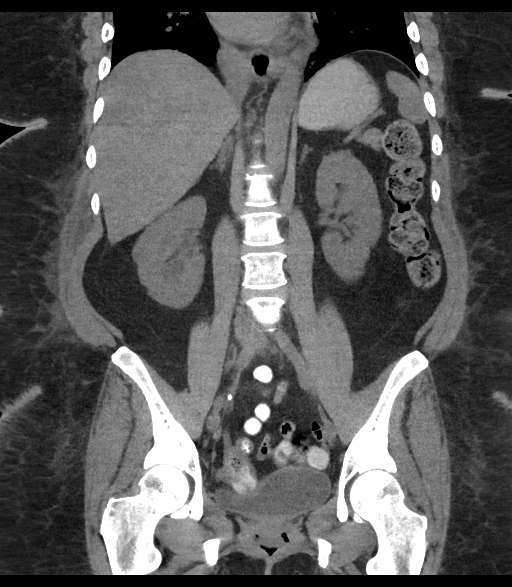

[15 of 46 positions shown; findings below may reference images not displayed]

FINDINGS: Lower chest: Small right pleural effusion, new since the prior CT.
There is opacity extending posteriorly and laterally from the
inferior right hilum consistent with treatment related scarring,
stable from the prior CT. Additional scarring and/or atelectasis is
noted at the lung bases.

Hepatobiliary: Liver is unremarkable. There are multiple gallstones.
No evidence of acute cholecystitis. No bile duct dilation.

Pancreas: Unremarkable. No pancreatic ductal dilatation or
surrounding inflammatory changes.

Spleen: Normal in size without focal abnormality.

Adrenals/Urinary Tract: No adrenal masses.

Mild prominence of the right intrarenal collecting system. Subtle
haziness is noted along the right renal contour, mostly inferiorly.
No renal masses. No intrarenal stones. Normal left intrarenal
collecting system. There are 2 calcifications adjacent to the right
ureter which project within the gonadal vein. No convincing right
ureteral stone. Both ureters are normal course and caliber.

No bladder wall thickening, mass or stone.

Stomach/Bowel: Stomach is within normal limits. Appendix appears
normal. No evidence of bowel wall thickening, distention, or
inflammatory changes.

Vascular/Lymphatic: Right inguinal mass measuring 5 x 3.4 x 3.9 cm,
previously 4.3 x 2.8 x 3.4 cm. This is consistent with an enlarged
node or conglomeration of nodes. Right external iliac node lies
adjacent to this measuring 16 mm in short axis, unchanged. No other
adenopathy.

Minor aortic atherosclerotic calcifications.  No aneurysm.

Reproductive: Uterus mildly enlarged by a poorly defined fibroid,
stable from the prior study. No ovarian/adnexal masses.

Other: No hernia or ascites. Fluid attenuation tracks along the
right lateral abdominal wall musculature. There is an area of soft
tissue attenuation in the deep subcutaneous fat of the right flank,
stable from the prior CT. This measures 4.9 x 3.0 x 2.9 cm.

Musculoskeletal: No fracture or acute finding. No osteoblastic or
osteolytic lesions.
IMPRESSION: 1. Mild prominence of the right intrarenal collecting system, with
right ureter being normal in caliber with no convincing stone. There
are 2 stones that lie directly adjacent to the right ureter, but
these appear to reside in the gonadal vein, and are stable from the
prior CT. Findings support a recently passed stone in the proper
clinical setting.
2. Small amount of fluid attenuation tracks along the right lateral
abdominal wall musculature, nonspecific.
3. Soft tissue density along the right lateral abdominal wall soft
tissues, abutting the underlying musculature, is stable from the
prior CT. This may reflect a body wall metastatic lesion as
described on prior CTs.
4. Multiple gallstones.  No evidence of acute cholecystitis.
5. Small right pleural effusion, new since the prior study. Stable
right lung changes consistent with scarring from prior radiation
therapy.
6. Right inferior external iliac and inguinal adenopathy consistent
with metastatic disease. The larger conglomeration of inguinal lymph
nodes has increased in size from the prior CT as detailed above.

## 2018-06-26 IMAGING — DX DG CHEST 1V PORT
1 series · 1 of 1 positions shown · non-contrast
Comparison: Chest radiograph and chest CT July 27, 2017

CLINICAL DATA: Shortness of breath

EXAM:
PORTABLE CHEST 1 VIEW

[chest ap]
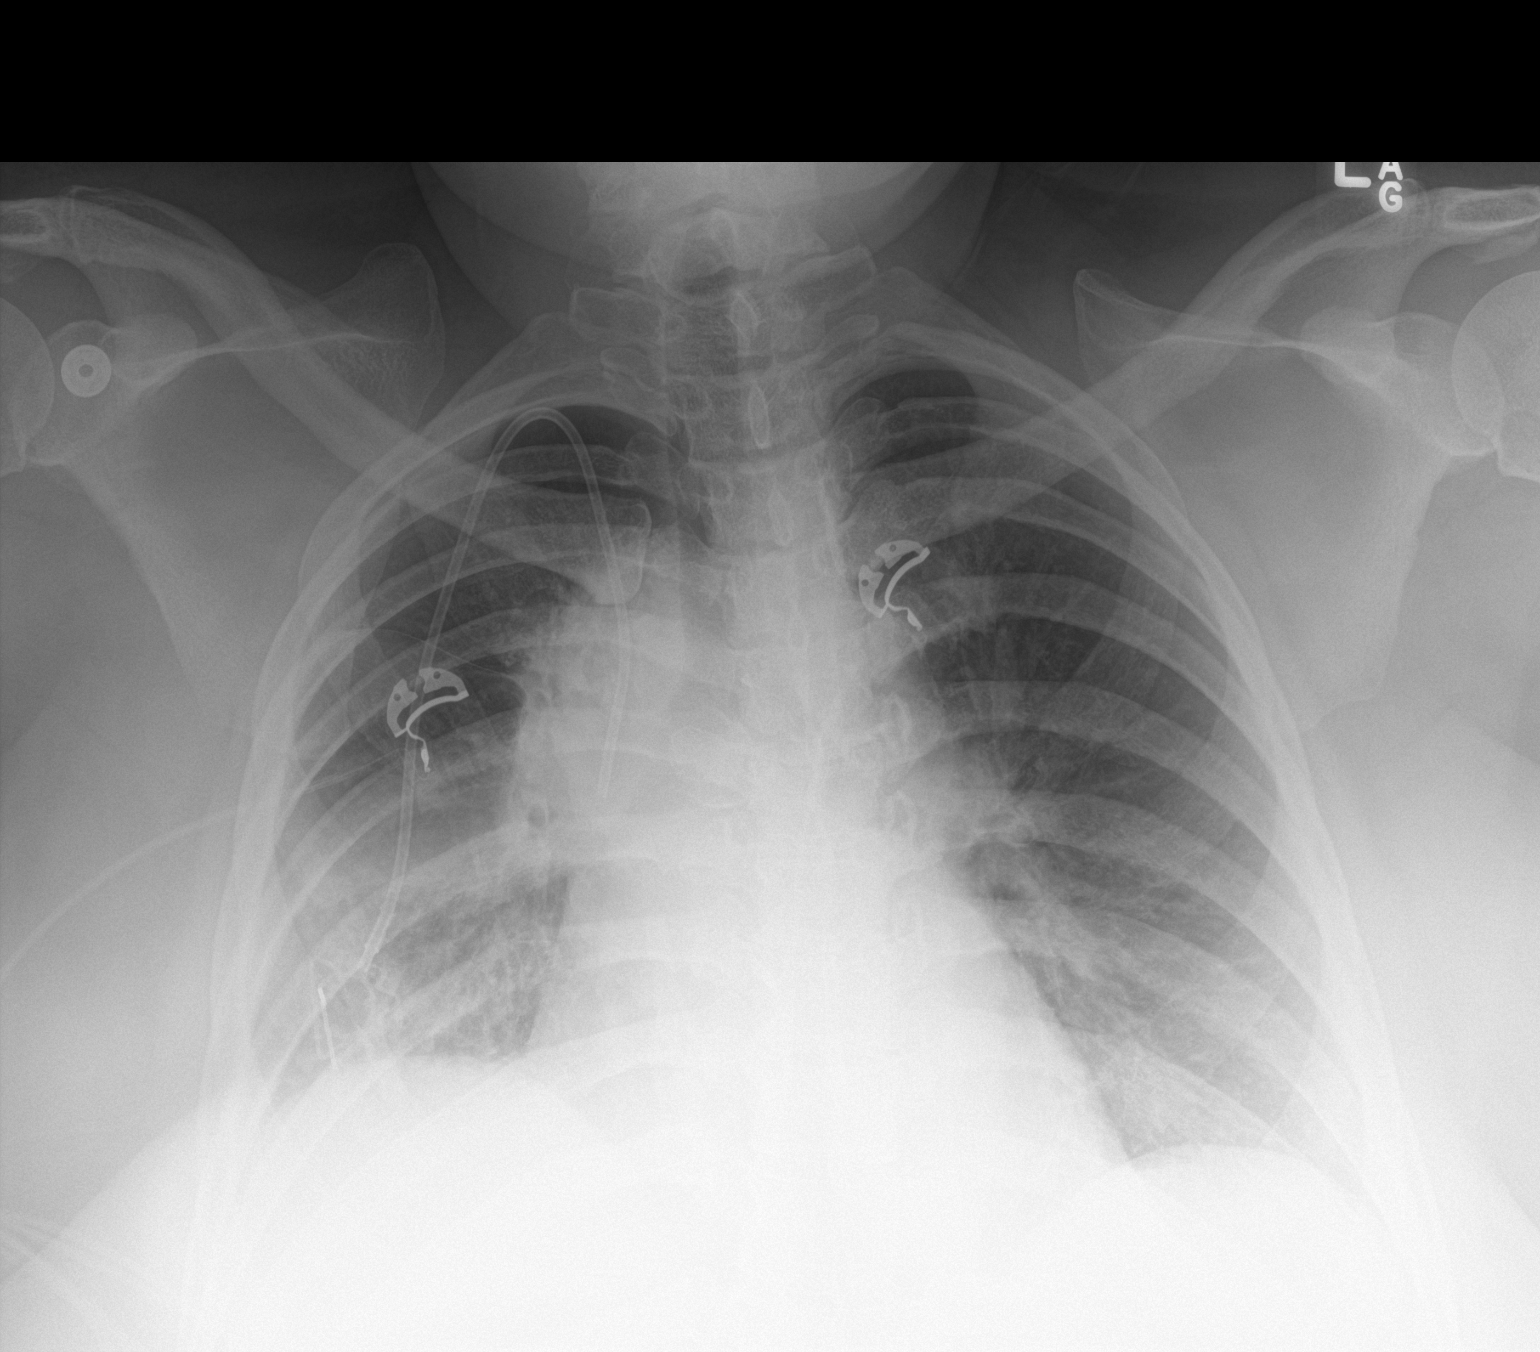

[1 of 1 positions shown; findings below may reference images not displayed]

FINDINGS: Port-A-Cath tip is in superior vena cava. No pneumothorax. There is
consolidation along the lateral right hemithorax, at least in part
due to radiation therapy change. There is probable layering pleural
effusion on the right. There is no new opacity. The left lung is
clear. Heart size and pulmonary vascularity are normal. There is
adenopathy in the abdomen/right paratracheal region, unchanged from
1 day prior. No new lymph node prominence. No bone lesions.
IMPRESSION: Persistent scarring and consolidation right medial hemithorax at
least in part due to radiation therapy change. There is adenopathy
in this area as well. There is layering pleural effusion on the
right. No new opacity evident on either side. Stable cardiac
silhouette. Port-A-Cath tip in superior vena cava without
pneumothorax.

## 2018-06-30 DIAGNOSIS — E039 Hypothyroidism, unspecified: Secondary | ICD-10-CM | POA: Diagnosis not present

## 2018-06-30 DIAGNOSIS — E1140 Type 2 diabetes mellitus with diabetic neuropathy, unspecified: Secondary | ICD-10-CM | POA: Diagnosis not present

## 2018-06-30 DIAGNOSIS — Z794 Long term (current) use of insulin: Secondary | ICD-10-CM | POA: Diagnosis not present

## 2018-06-30 DIAGNOSIS — E1165 Type 2 diabetes mellitus with hyperglycemia: Secondary | ICD-10-CM | POA: Diagnosis not present

## 2018-06-30 DIAGNOSIS — I10 Essential (primary) hypertension: Secondary | ICD-10-CM | POA: Diagnosis not present

## 2018-07-04 ENCOUNTER — Telehealth: Payer: Self-pay | Admitting: Physical Therapy

## 2018-07-04 NOTE — Telephone Encounter (Signed)
Talked with patient who states she wants to come in to PT.  She said that she does not have Home Health PT at this time.  Gave her a tentative appointment for 11:00 on April 9 and asked secretary to call and confirm tomorrow. Maudry Diego, PT 07/04/2018 @ 4:25 PM

## 2018-07-05 ENCOUNTER — Ambulatory Visit: Payer: Self-pay | Admitting: Internal Medicine

## 2018-07-10 ENCOUNTER — Ambulatory Visit: Payer: Medicare Other | Admitting: Rehabilitation

## 2018-07-13 ENCOUNTER — Other Ambulatory Visit: Payer: Self-pay

## 2018-07-13 ENCOUNTER — Ambulatory Visit: Payer: Medicare Other | Attending: Hematology | Admitting: Rehabilitation

## 2018-07-13 ENCOUNTER — Ambulatory Visit: Payer: Medicare Other | Admitting: Physical Therapy

## 2018-07-13 ENCOUNTER — Encounter: Payer: Self-pay | Admitting: Rehabilitation

## 2018-07-13 DIAGNOSIS — R2689 Other abnormalities of gait and mobility: Secondary | ICD-10-CM | POA: Diagnosis not present

## 2018-07-13 DIAGNOSIS — M25611 Stiffness of right shoulder, not elsewhere classified: Secondary | ICD-10-CM

## 2018-07-13 DIAGNOSIS — R29898 Other symptoms and signs involving the musculoskeletal system: Secondary | ICD-10-CM

## 2018-07-13 DIAGNOSIS — I890 Lymphedema, not elsewhere classified: Secondary | ICD-10-CM

## 2018-07-13 NOTE — Patient Instructions (Signed)
Access Code: [email or web address]  URL: [email or web address]  Date: 07/13/2018  Prepared by: Shan Levans   Exercises  Supine Shoulder Flexion Extension AAROM with Dowel - 10 reps - 5 seconds hold - 1x daily - 7x weekly  Shoulder Scaption AAROM with Dowel - 10 reps - 3-5 seconds hold - 1x daily - 7x weekly  Shoulder Flexion Overhead with Dowel - 10 reps - 1 sets - 3-5 hold - 1x daily - 7x weekly  Standing Shoulder Row - 20 reps - 3-5 seconds hold - 2x daily - 7x weekly

## 2018-07-13 NOTE — Therapy (Signed)
[place], [place], [address] Phone: [phone]   Fax:  [phone]  Physical Therapy Evaluation  Patient Details  Name: Marilyn [name] MRN: [id number] Date of Birth: 1965/10/06 Referring Provider (PT): Dr. Irene Limbo   Encounter Date: 07/13/2018  PT End of Session - 07/13/18 1343    Visit Number  1    Number of Visits  8    Date for PT Re-Evaluation  09/07/18    Authorization Type  Medicare/Medicaid    PT Start Time  1100    PT Stop Time  1149    PT Time Calculation (min)  49 min    Activity Tolerance  Patient tolerated treatment well    Behavior During Therapy  [facility] for tasks assessed/performed       Past Medical History:  Diagnosis Date   Arthritis    Asthma    Diabetes mellitus    Type II   Hemorrhoids    Hypercholesteremia    Hypertension    Hypothyroidism    Metastatic lung cancer (metastasis from lung to other site) ([place])    Lung, Mets to skin- right flank. 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 mild - EF 45-50% by echo 10/2016 but EF 52% by low risk nuc 11/2016. b. Echo 07/2017 - EF normal, mild AI, unremarkable echo otherwise.   Obese    Pneumonia 2017   Sinus tachycardia    a. dates back to at least 2013, etiology unclear.    Past Surgical History:  Procedure Laterality Date   CESAREAN SECTION     COLONOSCOPY     I/D  arm Right 2013   IR FLUORO GUIDE PORT INSERTION RIGHT  02/18/2017   IR GENERIC HISTORICAL  01/08/2016   IR US GUIDE VASC ACCESS RIGHT 01/08/2016 [name], DO [facility]   IR GENERIC HISTORICAL  01/08/2016   IR FLUORO GUIDE CV LINE RIGHT 01/08/2016 [name], DO [facility]   IR THORACENTESIS ASP PLEURAL SPACE W/IMG GUIDE  05/25/2018   IR US GUIDE VASC ACCESS RIGHT  02/18/2017   MULTIPLE EXTRACTIONS  WITH ALVEOLOPLASTY N/A 03/25/2017   Procedure: MULTIPLE EXTRACTION WITH ALVEOLOPLASTY (teeth #s four, six, seven, eight, nine, ten, eleven, one, twenty-three, twenty-four, twenty-five, twenty-six, twenty-nine, thirty);  Surgeon: [name], [name];  Location: [facility];  Service: Oral Surgery;  Laterality: N/A;   TUBAL LIGATION     US guided core needle biopsy     of right lower neck/supraclavicular lymph nodes    There were no vitals filed for this visit.   Subjective Assessment - 07/13/18 1108    Subjective  The swelling in the arm started about 2 months ago. My arm was bigger and now it feels back to normal.  if it swells it is mainly in the upper arm and hand, " my breast is also big"  Also weakness in the whole Rt side.      Pertinent History  small cell lung cancer with metastasis to multiple sites lymph nodes on the Rt side right axillary, right external iliac and right inguinal adenopathy. Extensive radiation changes involving the right paramediastinal lung. There is a focus of hypermetabolism in the right lower lobe which is suspicious for residual or recurrent tumor., hypothyroidism, CHF with EF of 40%, HTN, DM, currently getting Nivolumab q2 weeksbut pt  may decide to stop this and do palliative/hospice.  History of thoracentesis on 05/25/22 and negative for DVT Rt UE after hospitalization on 04/28/18.     Limitations  Standing;Walking;House hold activities;Other (comment)   dressing   Patient Stated Goals  Get my arm back right.  I can't lift it up     Currently in Pain?  Yes    Pain Score  5     Pain Location  Arm    Pain Orientation  Upper;Right    Pain Descriptors / Indicators  Aching;Sharp    Pain Type  Chronic pain   came on with the lymphedema   Pain Onset  More than a month ago    Pain Frequency  Intermittent    Aggravating Factors   lifting, reaching,     Pain Relieving Factors  i take percocet if i need         [facility] PT Assessment - 07/13/18 0001      Assessment    Medical Diagnosis  lung cancer with mets    Referring Provider (PT)  Dr. Irene Limbo    Onset Date/Surgical Date  04/28/18    Hand Dominance  Right    Next MD Visit  next week    Prior Therapy  HHPT      Precautions   Precaution Comments  active cancer, multiple met sites, lymphedema Rt UE and breast      Restrictions   Weight Bearing Restrictions  No      Balance Screen   Has the patient fallen in the past 6 months  No    Has the patient had a decrease in activity level because of a fear of falling?   Yes    Is the patient reluctant to leave their home because of a fear of falling?   No      Home Environment   Living Environment  Private residence    Living Arrangements  Alone    Available Help at Discharge  Family    Type of [place] to enter    Entrance Stairs-Number of Steps  [place]  One level    [place] - 4 wheels    Additional Comments  ramp at home      Prior Function   Level of Independence  Independent with gait   mom helps with cooking, cleaning, making the bed, dressing   Vocation  Unemployed      Cognition   Overall Cognitive Status  Within Functional Limits for tasks assessed      Observation/Other Assessments   Observations  see photo.  Initially Rt nipple is inverted but after PT going to get camera to take a photo it is less so, will still update MD about this    Skin Integrity  Increased Rt breast size. Increased fibrosis around the nipple      Sensation   Light Touch  Appears Intact      Coordination   Gross Motor Movements are Fluid and Coordinated  Yes      Posture/Postural Control   Posture/Postural Control  Postural limitations    Postural Limitations  Rounded Shoulders;Forward head      ROM / Strength   AROM / PROM / Strength  AROM;Strength      AROM   AROM Assessment Site  Shoulder    Right/Left Shoulder  Right;Left    Right Shoulder Flexion  95  Degrees    Right Shoulder ABduction  90 Degrees     Right Shoulder Internal Rotation  70 Degrees    Right Shoulder External Rotation  80 Degrees    Left Shoulder Flexion  163 Degrees    Left Shoulder ABduction  163 Degrees    Left Shoulder Internal Rotation  80 Degrees    Left Shoulder External Rotation  90 Degrees          LYMPHEDEMA/ONCOLOGY QUESTIONNAIRE - 07/13/18 1119      Type   Cancer Type  Lung cancer      Date Lymphedema/Swelling Started   Date  04/28/18      Treatment   Active Chemotherapy Treatment  Yes    Date  --   unsure if she will continue after next week    Active Radiation Treatment  No    Past Radiation Treatment  Yes    Body Site  --   Rt lung region   Current Hormone Treatment  No      What other symptoms do you have   Are you Having Heaviness or Tightness  Yes    Are you having Pain  Yes    Are you having pitting edema  No    Is it Hard or Difficult finding clothes that fit  No      Lymphedema Assessments   Lymphedema Assessments  Upper extremities      Right Upper Extremity Lymphedema   15 cm Proximal to Olecranon Process  43 cm    10 cm Proximal to Olecranon Process  39.4 cm    Olecranon Process  31.7 cm    15 cm Proximal to Ulnar Styloid Process  26.7 cm    10 cm Proximal to Ulnar Styloid Process  22 cm    Just Proximal to Ulnar Styloid Process  18.3 cm    Across Hand at [employer]  21.7 cm    At [place] of 2nd Digit  7.4 cm      Left Upper Extremity Lymphedema   15 cm Proximal to Olecranon Process  41.2 cm    10 cm Proximal to Olecranon Process  38.5 cm    Olecranon Process  30 cm    15 cm Proximal to Ulnar Styloid Process  26.2 cm    10 cm Proximal to Ulnar Styloid Process  22.3 cm    Just Proximal to Ulnar Styloid Process  18 cm    Across Hand at [employer]  21.8 cm    At [place] of 2nd Digit  7.2 cm             Objective measurements completed on examination: See above findings.      [facility] Adult PT Treatment/Exercise - 07/13/18 0001      Exercises    Exercises  Shoulder;Other Exercises    Other Exercises   gave pt dowel exercises in standing and supine for Rt shoulder AROM      Manual Therapy   Manual Therapy  Edema management    Edema Management  Will check benefits for possible velcro garment and bras. faxed prescription and sent info to [facility] for verification                  PT Long Term Goals - 07/13/18 1354      PT LONG TERM GOAL #1   Title  Pt will be educated on lymphedema and risk reduction strategies    Time  8  Period  Weeks    Status  New      PT LONG TERM GOAL #2   Title  Pt will obtain velcro garment with independence in use/bra as necessary    Time  8    Period  Weeks    Status  New      PT LONG TERM GOAL #3   Title  Pt will be ind with ROM and strengthening activities for the Rt UE    Time  8    Period  Weeks    Status  New      PT LONG TERM GOAL #4   Title  Pt will be assessed for gait, balance, and endurance    Time  8    Period  Weeks             Plan - 07/13/18 1343    Clinical Impression Statement  Pt presents with Rt UE and breast swelling since 04/28/18 along with Rt sided fatigue and pain.  No findings in the ER.  negative for DVT but did have a thoracocentesis on 05/25/18 due to fluid here.  Pt reports the arm is basically normal today but that it intermittently swells.  Circumferential measurements today mostly similar except slightly larger in the upper arm.  Pt also with reports of Rt breast edema and heaviness.  Pt with nipple inversion upon initially removing bra today but less so after sitting about 3-4 minutes.  MD still notified for F/U visit next week. Pt also with Rt sided shoulder and UE decreased mobility and strength and reports of constant fatigue and decreased endurance.  Due to [facility] closure we are seeing pt today for lymphedema and pt will be encouraged to continue to HHPT strengthening exercises until we can see her more frequently.  Pt given shoulder AAROM  today and benefits sent to [facility] for verification for possible velcro garment.       Personal Factors and Comorbidities  Fitness;Comorbidity 3+    Comorbidities  metastatic cancer axillary invovlement, radiation history, CHF    Examination-Activity Limitations  Bathing;Dressing;Carry;Reach Overhead    Examination-Participation Restrictions  Church;Laundry;Shop;Cleaning;Community Activity;Meal Prep;Yard Work    Stability/Clinical Decision Making  Unstable/Unpredictable    International aid/development worker    Rehab Potential  Good    PT Frequency  1x / week    PT Duration  8 weeks    PT Treatment/Interventions  ADLs/Self Care Home Management;Therapeutic exercise;Therapeutic activities;Patient/family education;Manual lymph drainage;Manual techniques;Passive range of motion;Taping    PT Next Visit Plan  give pt lymphedema handout and risk reduction, benefits back?, velcro garment?, education on MLD with active tumor sites/palliative to see if any interest    PT Home Exercise Plan  Access Code: [email or web address]     Recommended Other Services  velcro garment    Consulted and Agree with Plan of Care  Patient       Patient will benefit from skilled therapeutic intervention in order to improve the following deficits and impairments:  Decreased knowledge of precautions, Decreased knowledge of use of DME, Decreased range of motion, Increased edema, Decreased activity tolerance, Decreased strength, Impaired UE functional use, Postural dysfunction  Visit Diagnosis: Lymphedema, not elsewhere classified  Stiffness of right shoulder, not elsewhere classified  Shoulder weakness  Other abnormalities of gait and mobility     Problem List Patient Active Problem List   Diagnosis Date Noted   Acute lower UTI 04/30/2018   Obese 04/30/2018   Degenerative cervical spinal stenosis 04/30/2018  HLD (hyperlipidemia) 04/29/2018   Asthma 04/29/2018   Depression 04/29/2018   Chronic diastolic CHF (congestive  heart failure) ([place]) 04/29/2018   Left arm weakness and numbness 04/29/2018   Right arm weakness 04/29/2018   Right sided numbness    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[place]) 03/16/2018   Counseling regarding advance care planning and goals of care 01/03/2018   Essential hypertension    DCM (dilated cardiomyopathy) ([place])    Non-ischemic cardiomyopathy ([place]) 02/04/2017   Metastasis to skin ([place]) 91/47/8295   Acute systolic heart failure ([place]) 12/01/2016   Primary cancer of right lung metastatic to other site ([place]) 11/24/2016   SOB (shortness of breath) 10/26/2016   Hypothyroidism 08/22/2016   PICC (peripherally inserted central catheter) flush 07/16/2016   Community acquired pneumonia    Sinus tachycardia    Thrush of mouth and esophagus ([place]) 04/07/2016   Constipation 04/07/2016   Palliative care by specialist    Port catheter in place 01/19/2016   Dehydration 01/14/2016   Tachycardia 01/14/2016   Adenocarcinoma of left lung, stage 3 ([place]) 01/01/2016   Non-small cell lung cancer ([place]) 12/31/2015   Mediastinal mass    Chest pain 12/26/2015   Headache 12/26/2015   Diabetes mellitus without complication ([place]) 62/13/0865    Shan Levans, PT 07/13/2018, 1:56 PM  [place], [place], [address] Phone: [phone]   Fax:  [phone]  Name: Marilyn [name] MRN: [id number] Date of Birth: 08-19-1965

## 2018-07-14 IMAGING — US US BIOPSY LYMPH NODE
1 series · 13 of 23 positions shown · non-contrast
Comparison: none

INDICATION: 51-year-old with history of metastatic lung adenocarcinoma. The
patient now has enlarged lymph nodes. Request for biopsy of an
enlarged right inguinal lymph node.

[Series 1: us biopsy lymph node · 13 of 23 slices shown]
[im 1/23]
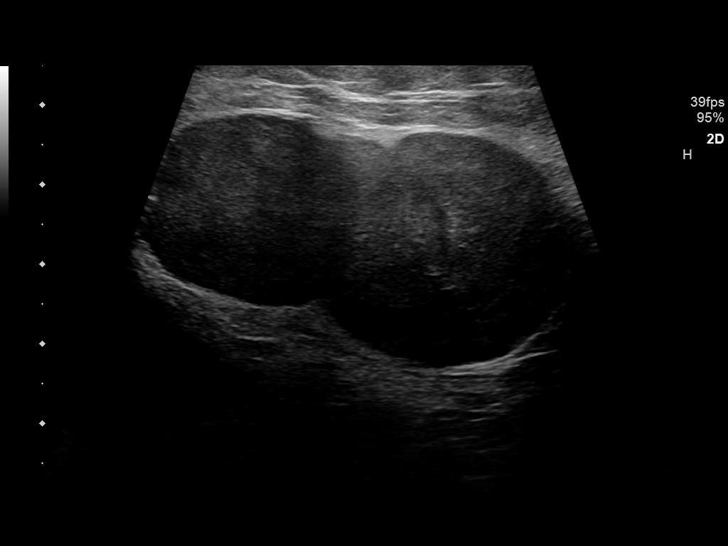
[im 3/23]
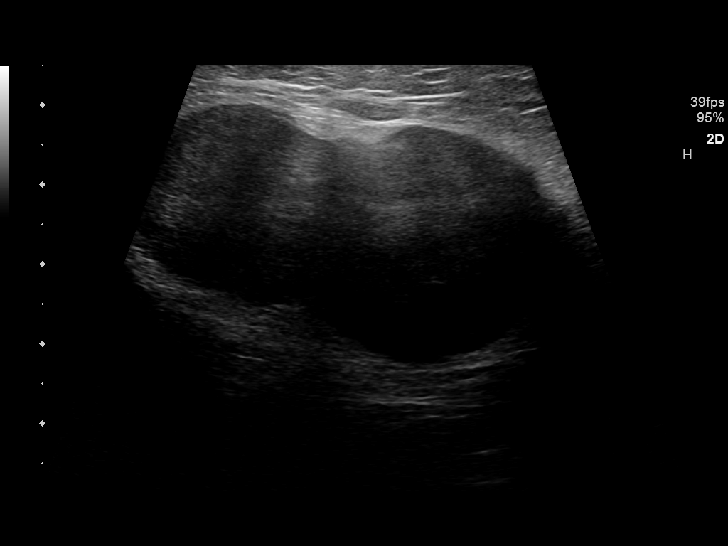
[im 5/23]
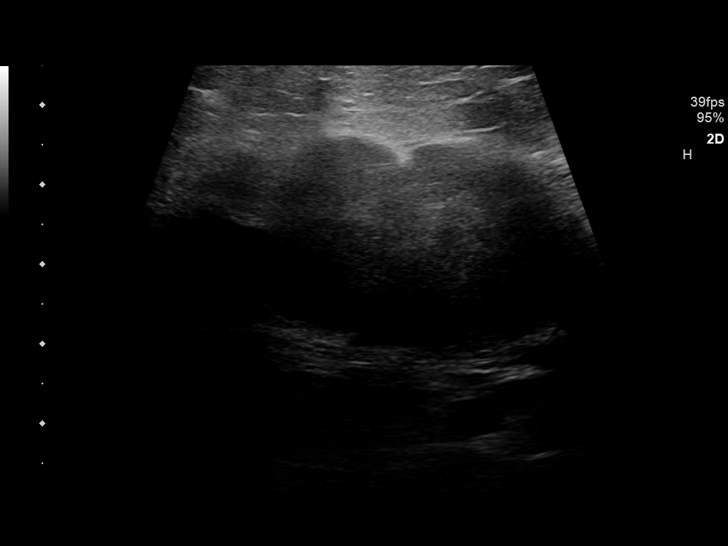
[im 7/23]
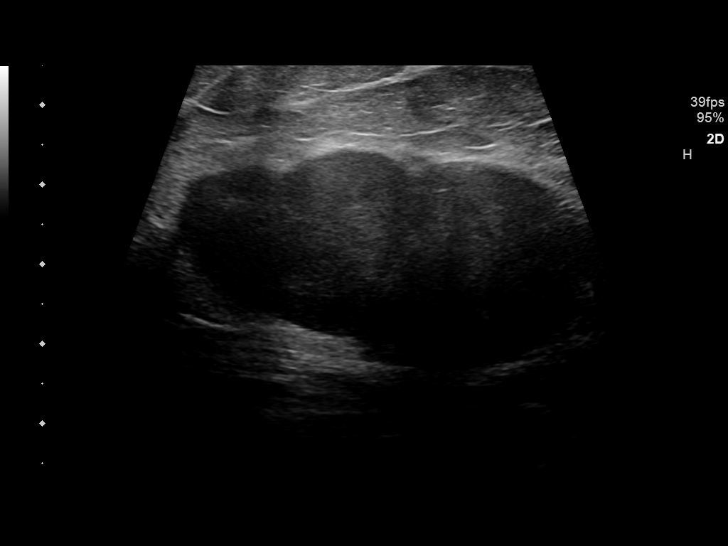
[im 8/23]
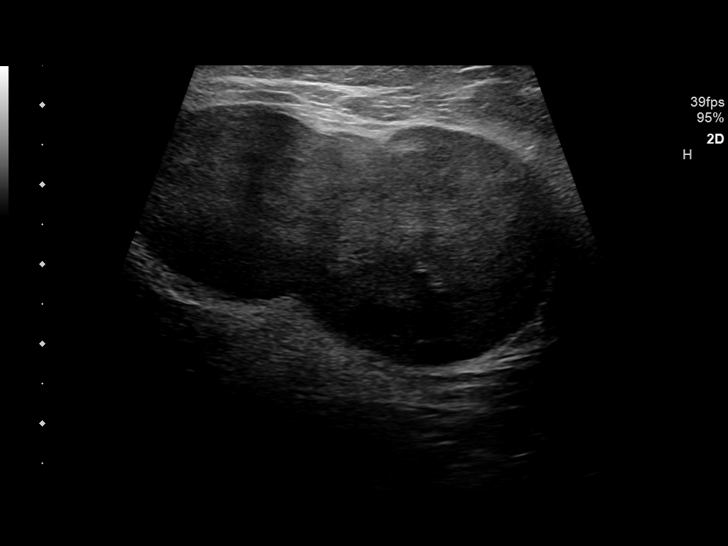
[im 10/23]
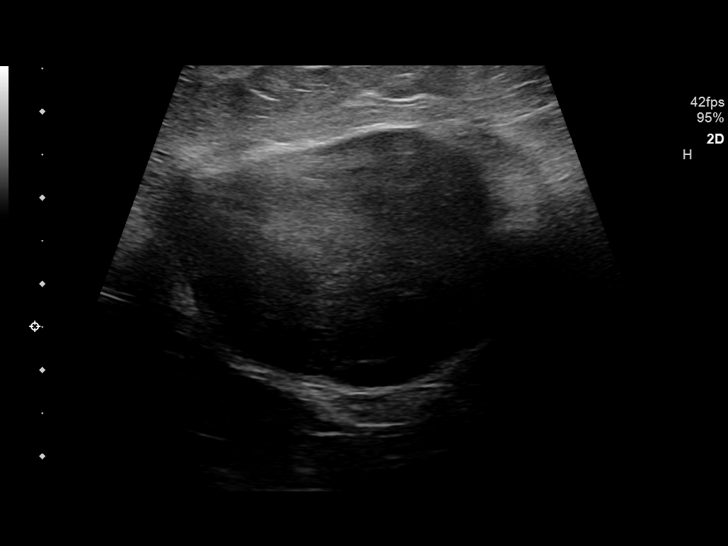
[im 12/23]
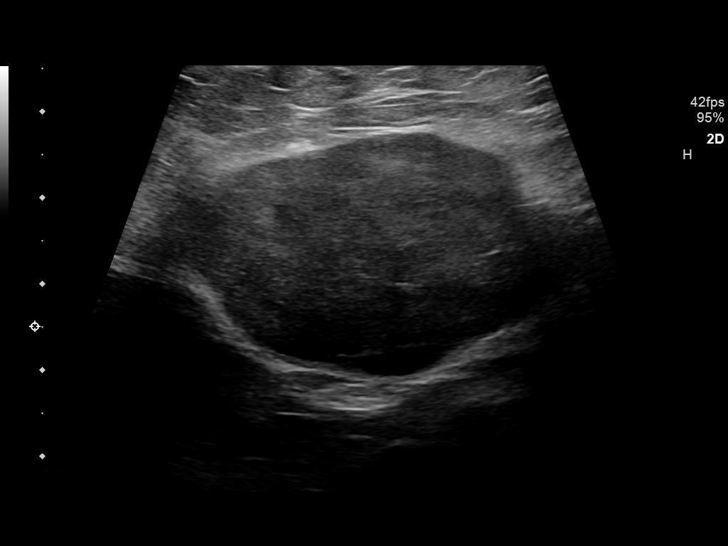
[im 14/23]
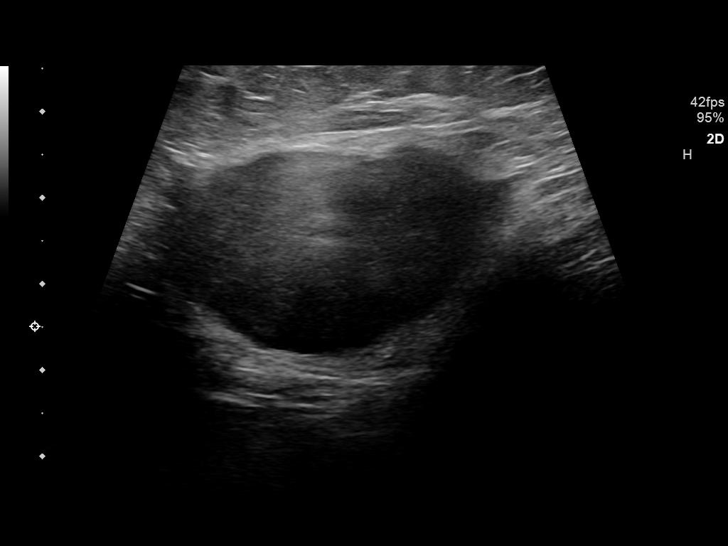
[im 16/23]
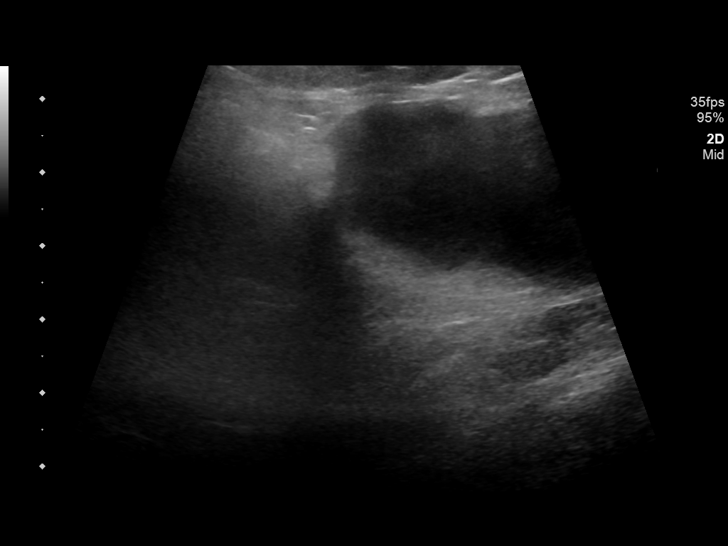
[im 17/23]
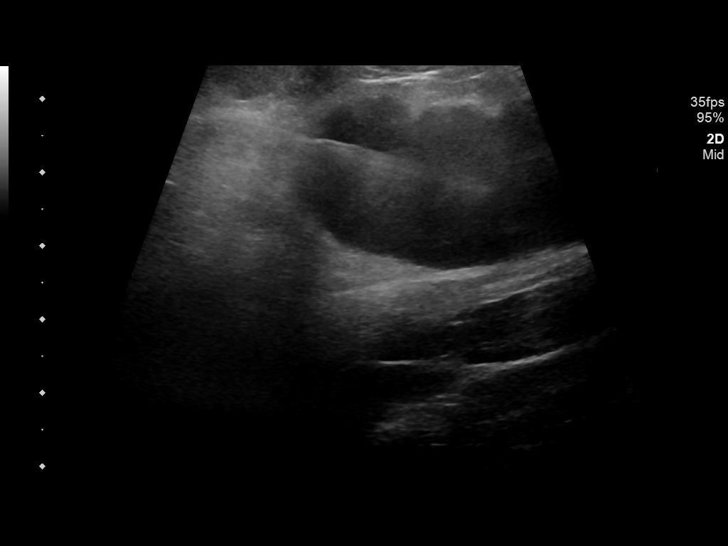
[im 19/23]
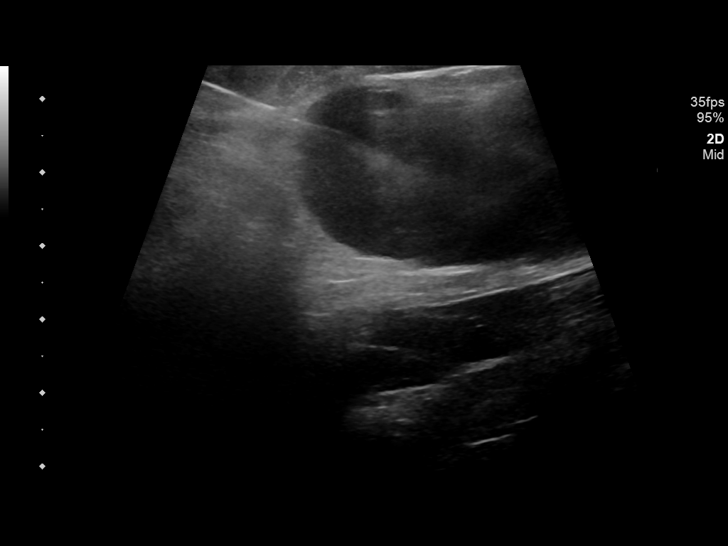
[im 21/23]
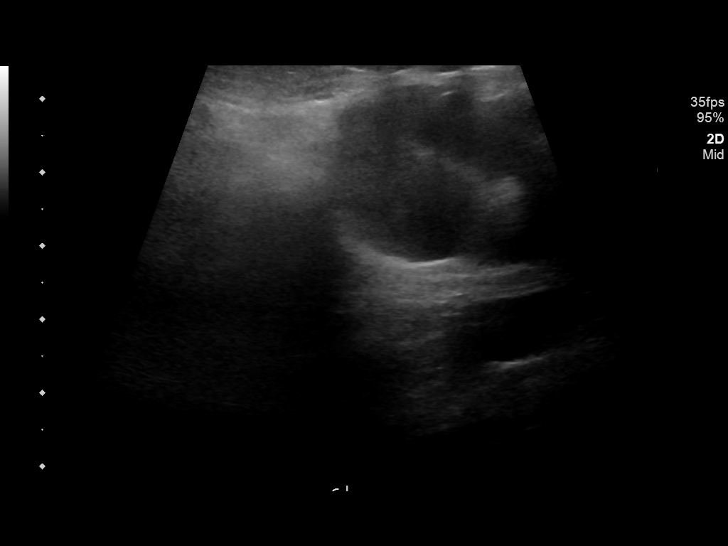
[im 23/23]
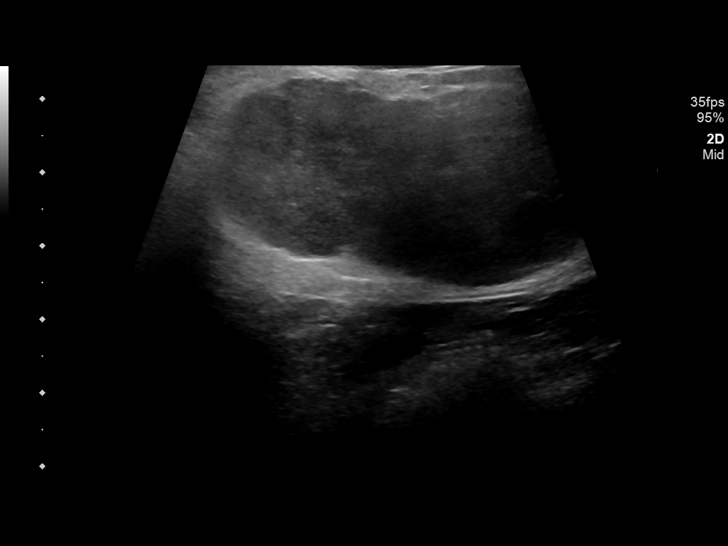

[13 of 23 positions shown; findings below may reference images not displayed]

EXAM:
ULTRASOUND-GUIDED CORE BIOPSY OF RIGHT INGUINAL LYMPH NODE

MEDICATIONS:
None.

ANESTHESIA/SEDATION:
Moderate (conscious) sedation was employed during this procedure. A
total of Versed 1.0 mg and Fentanyl 50 mcg was administered
intravenously.

Moderate Sedation Time: 17 minutes. The patient's level of
consciousness and vital signs were monitored continuously by
radiology nursing throughout the procedure under my direct
supervision.

FLUOROSCOPY TIME:  None

COMPLICATIONS:
None immediate.

PROCEDURE:
Informed written consent was obtained from the patient after a
thorough discussion of the procedural risks, benefits and
alternatives. All questions were addressed. A timeout was performed
prior to the initiation of the procedure.

Ultrasound demonstrated a large hypoechoic lymph node or mass in the
right groin. Right groin was prepped with chlorhexidine and sterile
field was created. Skin and soft tissues were anesthetized with 1%
lidocaine. Using ultrasound guidance, an 18 gauge core device was
directed into this right groin lesion. A total of 6 core biopsies
were obtained. Five core biopsies were placed in formalin and one
was placed in sterile saline. Bandage placed over the puncture site.
FINDINGS: Large hypoechoic lesion in the right groin corresponding with the
enlarged lymph node on recent PET-CT. Biopsy needle confirmed within
the lesion for the core biopsies.
IMPRESSION: Successful ultrasound guided core biopsies of the large right
inguinal lymph node.

## 2018-07-14 NOTE — Progress Notes (Signed)
[name]    HEMATOLOGY/ONCOLOGY CLINIC NOTE  Date of Service: 07/17/18     Patient Care Team: [name], FNP as PCP - General (Nurse Practitioner) Pixie Casino, MD as PCP - Cardiology (Cardiology)  CHIEF COMPLAINTS  F/u for continued management of metastatic lung adenocarcinoma  Diagnosis Metastatic Lung Adenocarcinoma with rt flank subcutaneous metastasis.  Current treatment:  Nivolumab q2 weeks Palliative RT to pain rt inguinal mass   PreviousTreatment -Concurrent Chemo-radiation with Carboplatin/Taxol. Completed RT on 02/13/2016 and has then completed 2 cycles of carboplatin + taxol. -Started Maintenance Durvalumab from  05/24/2016 q2weeks, switched to Nivolumab q2weeks on 12/13/16 once metastatic disease noted -s/p palliative RT to rt sided Subcutaneous metastatic mass.    HISTORY OF PRESENTING ILLNESS: Plz see my previous note for details on initial presentation.  INTERVAL HISTORY   Marilyn [name] presents to the office today for followup of her metastatic lung cancer. The patient's last visit with [place] was on 06/19/18. The pt reports that she is doing well overall.   The pt reports that she has had stable SOB, which she describes as "a little." She denies fevers, chills, or night sweats. She is having more cough in the last month, which is dry. She notes that this occurs at night and early in the morning, and denies sensation of a post nasal drip. She is eating well. She notes that she is only able to sleep about 1-2 hours at night, then wakes up and watches TV. She notes she might be feeling anxious at night, and sometimes naps. The pt notes that she is up and walking around. She notes that her right axillary lymphedema is stable, and she will be obtaining a compression sleeve and bra this week. She notes she is continuing to have dry itchy skin on her belly, back, sides of her breasts, between her legs, and inside of her elbows. She notes that the she has been using PO  hydroxyzine.   The pt notes that she has thought in the interim about our last discussion regarding the concern of progression and the question of whether to pursue treatment or not. She notes that at this time, she does not want to pursue further chemotherapy. The pt notes that if she became very SOB and developed uncontrolled pain, she wold want to consider treatment.  Lab results today (07/17/18) of CBC w/diff and CMP is as follows: all values are WNL except for RBC at 3.53, HGB at 10.7, HCT at 33.1, Glucose at 247, Albumin at 34, AST at 14. 07/17/18 TSH and T4 are pending  On review of systems, pt reports dry itchy skin, some stable SOB, stable energy levels, eating well, stable axillary lymphedema, dry cough, and denies concern for infections, fevers, chills, and any other symptoms.   MEDICAL HISTORY:  Past Medical History:  Diagnosis Date   Arthritis    Asthma    Diabetes mellitus    Type II   Hemorrhoids    Hypercholesteremia    Hypertension    Hypothyroidism    Metastatic lung cancer (metastasis from lung to other site) (HCC)    Lung, Mets to skin- right flank. 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 mild - EF 45-50% by echo 10/2016 but EF 52% by low risk nuc 11/2016. b. Echo 07/2017 - EF normal, mild AI, unremarkable  echo otherwise.   Obese    Pneumonia 2017   Sinus tachycardia    a. dates back to at least 2013, etiology unclear.    SURGICAL HISTORY: Past Surgical History:  Procedure Laterality Date   CESAREAN SECTION     COLONOSCOPY     I/D  arm Right 2013   IR FLUORO GUIDE PORT INSERTION RIGHT  02/18/2017   IR GENERIC HISTORICAL  01/08/2016   IR US GUIDE VASC ACCESS RIGHT 01/08/2016 [name], DO [facility]   IR GENERIC HISTORICAL  01/08/2016   IR FLUORO GUIDE CV LINE RIGHT 01/08/2016 [name], DO  [facility]   IR THORACENTESIS ASP PLEURAL SPACE W/IMG GUIDE  05/25/2018   IR US GUIDE VASC ACCESS RIGHT  02/18/2017   MULTIPLE EXTRACTIONS WITH ALVEOLOPLASTY N/A 03/25/2017   Procedure: MULTIPLE EXTRACTION WITH ALVEOLOPLASTY (teeth #s four, six, seven, eight, nine, ten, eleven, one, twenty-three, twenty-four, twenty-five, twenty-six, twenty-nine, thirty);  Surgeon: [name], [name];  Location: [facility];  Service: Oral Surgery;  Laterality: N/A;   TUBAL LIGATION     US guided core needle biopsy     of right lower neck/supraclavicular lymph nodes    SOCIAL HISTORY: Social History   Socioeconomic History   Marital status: Divorced    Spouse name: Not on file   Number of children: Not on file   Years of education: Not on file   Highest education level: Not on file  Occupational History   Not on file  Social Needs   Financial resource strain: Not on file   Food insecurity:    Worry: Not on file    Inability: Not on file   Transportation needs:    Medical: No    Non-medical: No  Tobacco Use   Smoking status: Never Smoker   Smokeless tobacco: Never Used  Substance and Sexual Activity   Alcohol use: No   Drug use: No   Sexual activity: Not Currently  Lifestyle   Physical activity:    Days per week: Not on file    Minutes per session: Not on file   Stress: Not on file  Relationships   Social connections:    Talks on phone: Not on file    Gets together: Not on file    Attends religious service: Not on file    Active member of club or organization: Not on file    Attends meetings of clubs or organizations: Not on file    Relationship status: Not on file   Intimate partner violence:    Fear of current or ex partner: No    Emotionally abused: No    Physically abused: No    Forced sexual activity: No  Other Topics Concern   Not on file  Social History Narrative   No bird or mold exposure. No recent travel.   11-23-17 Unable to ask abuse questions  mother with her today.    FAMILY HISTORY: Family History  Problem Relation Age of Onset   Heart failure Mother    Hypertension Mother    Cancer Neg Hx    Rheumatologic disease Neg Hx     ALLERGIES:  has No Known Allergies.  MEDICATIONS:  Current Outpatient Medications  Medication Sig Dispense Refill   amoxicillin-clavulanate (AUGMENTIN) 875-125 MG tablet Take 1 tablet by mouth 2 (two) times daily. 14 tablet 0   ASPIRIN LOW DOSE 81 MG EC tablet TAKE 1 TABLET BY MOUTH EVERY DAY (Patient taking differently: Take 81 mg by mouth daily.  Enteric Coated.  ** DO NOT CRUSH **) 60 tablet 2   atorvastatin (LIPITOR) 40 MG tablet Take 1 tablet (40 mg total) by mouth daily at 6 PM. 30 tablet 0   DULoxetine (CYMBALTA) 60 MG capsule TAKE 1 CAPSULE BY MOUTH EVERY DAY (Patient taking differently: Take 60 mg by mouth daily. ) 30 capsule 2   fluconazole (DIFLUCAN) 200 MG tablet Take 1 tablet (200 mg total) by mouth daily. 1 tablet 0   hydrOXYzine (ATARAX/VISTARIL) 10 MG tablet Take 1 tablet (10 mg total) by mouth 3 (three) times daily as needed for itching. 30 tablet 0   insulin aspart (NOVOLOG) 100 UNIT/ML FlexPen As per sliding scale instructions (Patient taking differently: Inject 0-10 Units into the skin 3 (three) times daily. As per sliding scale instructions) 15 mL 2   Insulin Degludec (TRESIBA FLEXTOUCH) 200 UNIT/ML SOPN Inject 100 Units into the skin daily before breakfast.      levalbuterol (XOPENEX HFA) 45 MCG/ACT inhaler Inhale 1-2 puffs into the lungs every 8 (eight) hours as needed for wheezing. 1 Inhaler 12   levothyroxine (SYNTHROID, LEVOTHROID) 150 MCG tablet TAKE 1 TABLET BY MOUTH EVERY DAY BEFORE BREAKFAST (Patient taking differently: Take 150 mcg by mouth daily before breakfast. ) 30 tablet 2   lidocaine-prilocaine (EMLA) cream Apply 1 application topically as needed. 30 g 6   metoprolol succinate (TOPROL-XL) 50 MG 24 hr tablet Take 1 tablet (9m) by mouth in the morning  and 1/2 tablet (269m in the evening. Take with or immediately following a meal. (Patient taking differently: Take 25-50 mg by mouth See admin instructions. Take 1 tablet (5049mby mouth in the morning and 1/2 tablet (83m99mn the evening. Take with or immediately following a meal.) 135 tablet 3   naproxen (NAPROSYN) 500 MG tablet Take 1 tablet (500 mg total) by mouth 2 (two) times daily with a meal. 20 tablet 0   nitrofurantoin, macrocrystal-monohydrate, (MACROBID) 100 MG capsule Take 1 capsule (100 mg total) by mouth every 12 (twelve) hours. 8 capsule 0   oxyCODONE-acetaminophen (PERCOCET) 5-325 MG tablet Take 1 tablet by mouth every 6 (six) hours as needed for moderate pain or severe pain. 50 tablet 0   pregabalin (LYRICA) 50 MG capsule Take 50 mg by mouth 3 (three) times daily.     senna-docusate (SENNA S) 8.6-50 MG tablet Take 2 tablets by mouth 2 (two) times daily. May reduce to 2 tab po HS as needed once regular BM estabished (Patient taking differently: Take 2 tablets by mouth 2 (two) times daily as needed (FOR CONSTIPATION). =) 60 tablet 1   traZODone (DESYREL) 50 MG tablet Take 1-2 tablets (50-100 mg total) by mouth at bedtime as needed for sleep. 60 tablet 0   Vitamins A & D (VITAMIN A & D) OINT Apply 1 application topically 2 (two) times daily. 1 Tube 3   No current facility-administered medications for this visit.    Facility-Administered Medications Ordered in Other Visits  Medication Dose Route Frequency Provider Last Rate Last Dose   sodium chloride 0.9 % injection 10 mL  10 mL Intravenous PRN KaleBrunetta Genera   10 mL at 05/24/16 1252    REVIEW OF SYSTEMS:    A 10+ POINT REVIEW OF SYSTEMS WAS OBTAINED including neurology, dermatology, psychiatry, cardiac, respiratory, lymph, extremities, GI, GU, Musculoskeletal, constitutional, breasts, reproductive, HEENT.  All pertinent positives are noted in the HPI.  All others are negative.   PHYSICAL EXAMINATION: ECOG  PERFORMANCE STATUS: 1 -  Symptomatic but completely ambulatory  Vitals:   07/17/18 1053  BP: 111/70  Pulse: 97  Resp: 19  Temp: (!) 97.4 F (36.3 C)  SpO2: 97%   Filed Weights   07/17/18 1053  Weight: 277 lb 9.6 oz (125.9 kg)   .Body mass index is 40.99 kg/m.  GENERAL:alert, in no acute distress and comfortable SKIN: no acute rashes, no significant lesions EYES: conjunctiva are pink and non-injected, sclera anicteric OROPHARYNX: MMM, no exudates, no oropharyngeal erythema or ulceration NECK: supple, no JVD LYMPH:  no palpable lymphadenopathy in the cervical, axillary or inguinal regions LUNGS: Some decreased air entry in right base HEART: regular rate & rhythm ABDOMEN:  normoactive bowel sounds , non tender, not distended. No palpable hepatosplenomegaly.  Extremity: no pedal edema PSYCH: alert & oriented x 3 with fluent speech NEURO: no focal motor/sensory deficits   LABORATORY DATA: .  [name] CBC Latest Ref Rng & Units 07/17/2018 06/19/2018 05/30/2018  WBC 4.0 - 10.5 K/uL 4.1 4.3 4.9  Hemoglobin 12.0 - 15.0 g/dL 10.7(L) 11.5(L) 12.4  Hematocrit 36.0 - 46.0 % 33.1(L) 36.5 39.0  Platelets 150 - 400 K/uL 285 230 243    . CMP Latest Ref Rng & Units 07/17/2018 06/19/2018 05/30/2018  Glucose 70 - 99 mg/dL 247(H) 249(H) 343(H)  BUN 6 - 20 mg/dL '9 9 7  ' Creatinine 0.44 - 1.00 mg/dL 0.92 1.08(H) 1.08(H)  Sodium 135 - 145 mmol/L 140 139 137  Potassium 3.5 - 5.1 mmol/L 4.1 4.3 4.0  Chloride 98 - 111 mmol/L 103 100 99  CO2 22 - 32 mmol/L '26 27 28  ' Calcium 8.9 - 10.3 mg/dL 8.9 8.9 9.1  Total Protein 6.5 - 8.1 g/dL 8.1 7.8 8.3(H)  Total Bilirubin 0.3 - 1.2 mg/dL 0.4 0.3 0.4  Alkaline Phos 38 - 126 U/L 105 80 91  AST 15 - 41 U/L 14(L) 27 21  ALT 0 - 44 U/L '7 15 10               ' 08/18/17 Bx:   08/16/17 Molecular Pathology:    RADIOGRAPHIC STUDIES:  .No results found.   ASSESSMENT & PLAN:   53 y.o.  African-American female with history of hypertension, diabetes,  asthma, dysuria mass and morbid obesity with    #1 Metastatic Lung Adenocarcinoma -on presentation Rt sided at least Stage IIIB (on diagnosis) with large right paratracheal mass with mediastinal adenopathy that appears to have grown significantly over the last 6-7 months and rt supraclavicular LN +.  -Noted to have a small mass in the left adrenal on CT but PET/CT neg for metastatic disease. Patient has been a lifelong nonsmoker -MRI of the brain was negative for any metastatic disease. At diagnosis. -High PDL1 expression (90%) on foundation One Neg for EGFR, ALK, ROS-1 and BRAF mutations. -Patient completed her planned definitive chemoradiation with Carbo Taxol on 02/13/2016. No prohibitive toxicities other than some grade 1 skin desquamation and some grade 1-2 radiation esophagitis. She has subsequently received 2 out of 2 planned dose of carboplatin + Taxol. -CTA chest 10/29/2016  no evidence of lung cancer progression in the chest. No PE -CT abd/pelvis-10/29/2016  Interval 5.0 x 5.0 x 4.7 cm mass in the right lateral subcutaneous fat at the level of the mid abdomen. This has CT features compatible with a metastasis or primary neoplasm. -CT C/A/P (05/12/2017): Interval development of right axillary, right external iliac and right inguinal adenopathy. Suspicious for nodal metastasis. 2. Decrease in size of right paratracheal adenopathy. 3. Decrease in  size and enhancement associated with right lateral body wall lesion. 4. New small nonspecific pulmonary nodule in the left lower lobe measuring 6 mm. 5. Stable appearance of changes secondary to external beam radiation within the right lung  08/03/17 PET which revealed 2.8 cm right axillary lymph node is markedly hypermetabolic and consistent with metastatic disease. Extensive radiation changes involving the right paramediastinal lung. There is a focus of hypermetabolism in the right lower lobe which is suspicious for residual or recurrent tumor.  11/11/17  CT C/A/P revealed There has been interval increase in number of enlarged of right axillary nodes and increase in size of right inguinal adenopathy compatible with progression of disease. Stable appearance of the right lung with changes of external beam radiation.    03/01/18 CT C/A/P revealed Bulky right subpectoral/axillary adenopathy, markedly progressive from 11/11/2017. Enlarging retrocrural lymph node. Low right paratracheal adenopathy is stable. 2. Interval resection of a right inguinal nodal mass. Stable right external iliac lymph node. 3. Cholelithiasis.  Stable common bile duct dilatation. 4.  Aortic atherosclerosis.  she received maintenance Durvalumab from  05/24/2016 q2weeks, switched to Nivolumab q2weeks on 12/13/16 in the setting of Biopsy proven isolated metastatic disease. Given the tumor is strongly PDL1 positive and patient has only isolated metastatic disease was treated with palliative RT to metastases with significant improvement. Molecular pathology from 515/19 did not reveal obviously targetable mutations with NF1 and TP53 mutations present.  05/24/18 CT Chest revealed Mixed interval change. 2. Moderate dependent right pleural effusion, significantly increased. 3. Right axillary adenopathy is decreased. 4. New left supraclavicular and left posterior mediastinal lymphadenopathy. Increased right retrocrural adenopathy. These findings are worrisome for progression of metastatic nodal disease. 5. New mild patchy nodular consolidation in the peripheral right middle lobe, which could be inflammatory or neoplastic. 6. Similar finely nodular patchy thickening of the peripheral peribronchovascular interstitium and interlobular septa in the lower lungs bilaterally, nonspecific, lymphangitic tumor not excluded. 7. Nonspecific increased right breast skin thickening, possibly treatment related. Aortic Atherosclerosis.  05/30/18 CT Abdomen revealed Cholelithiasis, without associated inflammatory  changes to suggest acute cholecystitis. Moderate right pleural effusion. Please note that the pelvis was not imaged.  Plan:  -Discussed pt labwork today, 07/17/18; mild anemia with HGB at 10.7, other blood counts and chemistries are stable -Discussed again my concern the patient's cancer may be progressing, and the option to pursue further treatment. The pt has considered this in the interim, and notes that at this time, she does not want to pursue further treatment, though this could mean the progression of her disease. She is open to returning to treatment in the future if she develops significant SOB or uncontrolled pain. -Will order CXR today to evaluate fluid status - slight pleural effusion -Will order CT Chest w/out contrast in 5 weeks for further evaluation -Advised crowd avoidance and infection prevention strategies -Pt had 279m clear yellow fluid removed on 05/25/18 thoracentesis -Last radiographic findings related to cancer progression vs infection vs inflammation. Radiated lymph nodes have reduced in size, however new lymph node progression is also noted. Did pursue empiric antibiotics. -Holding Nivolumab -Home O2, and Social work referral -Continue with outpatient PT -Vitamin A&D ointment and Hydorxyzine for vaginal itching -Advised that pt continue follow up with her cardiologist -Follow up with PCP TSelina Cooley FNP for DM management -Will see the pt back in 6 weeks, sooner if any new concerns   #3 h/o radiation pneumonitis - no new symptoms. Does has some cough with exertion but this has improved.  #  4 Improving fatigue (grade 1) - improved fatigue with levothyroxine compliance with near normalization of FT4 levels.  - previously given TSH of 12 -- increased Levothyroxine from 1109mg to 1533m  ---will likely need larger dose but in the setting of baseline tachycardia we are gradually increasing dose to avoid ppt cardiac arrhythmias.  TSH elevated due to medication non  compliance.  #5 normocytic anemia related to chemotherapy -resolving and stable  #6 Left chest wall pain due to costochondritis - mx with pain medications. CTA x 3 neg for PE. No significant pain at this time.  #7 Sinus tachycardia - on BB per cards , no PE on CTA x 3 , -Still having sinus tachycardia . Partly from deconditioning and anemia. Seeing Dr. HiDebara Pickettrom cardiology  -Continues to be on BB  #8 neoplasm related pain is currently controlled without significant medications. Clinically likely has sleep apnea . Would need to be careful with sedative medications . 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 The patient knows to call the clinic with any problems, questions or concerns.  The total time spent in the appt was 30 minutes and more than 50% was on counseling and direct patient cares.   GautSullivan LoneMS ACovingtonIVMS [facility]/Oncology Physician [facility]):       [phone] cell):  [phone]):           [phone] SchuBaldwin [place] acting as a scribe for Dr. GautSullivan Lone.I have reviewed the above documentation for accuracy and completeness, and I agree with the above. .GauBrunetta Genera

## 2018-07-17 ENCOUNTER — Telehealth: Payer: Self-pay | Admitting: *Deleted

## 2018-07-17 ENCOUNTER — Inpatient Hospital Stay: Payer: Medicare Other

## 2018-07-17 ENCOUNTER — Inpatient Hospital Stay: Payer: Medicare Other | Attending: Hematology | Admitting: Hematology

## 2018-07-17 ENCOUNTER — Other Ambulatory Visit: Payer: Self-pay

## 2018-07-17 VITALS — BP 111/70 | HR 97 | Temp 97.4°F | Resp 19 | Ht 69.0 in | Wt 277.6 lb

## 2018-07-17 DIAGNOSIS — E039 Hypothyroidism, unspecified: Secondary | ICD-10-CM | POA: Insufficient documentation

## 2018-07-17 DIAGNOSIS — G4700 Insomnia, unspecified: Secondary | ICD-10-CM | POA: Diagnosis not present

## 2018-07-17 DIAGNOSIS — R5383 Other fatigue: Secondary | ICD-10-CM | POA: Insufficient documentation

## 2018-07-17 DIAGNOSIS — C3491 Malignant neoplasm of unspecified part of right bronchus or lung: Secondary | ICD-10-CM | POA: Insufficient documentation

## 2018-07-17 DIAGNOSIS — I10 Essential (primary) hypertension: Secondary | ICD-10-CM

## 2018-07-17 DIAGNOSIS — Z79899 Other long term (current) drug therapy: Secondary | ICD-10-CM | POA: Diagnosis not present

## 2018-07-17 DIAGNOSIS — C792 Secondary malignant neoplasm of skin: Secondary | ICD-10-CM | POA: Diagnosis not present

## 2018-07-17 DIAGNOSIS — Z9221 Personal history of antineoplastic chemotherapy: Secondary | ICD-10-CM

## 2018-07-17 DIAGNOSIS — Z794 Long term (current) use of insulin: Secondary | ICD-10-CM | POA: Insufficient documentation

## 2018-07-17 DIAGNOSIS — I428 Other cardiomyopathies: Secondary | ICD-10-CM | POA: Diagnosis not present

## 2018-07-17 DIAGNOSIS — Z8701 Personal history of pneumonia (recurrent): Secondary | ICD-10-CM | POA: Insufficient documentation

## 2018-07-17 DIAGNOSIS — E1165 Type 2 diabetes mellitus with hyperglycemia: Secondary | ICD-10-CM | POA: Diagnosis not present

## 2018-07-17 DIAGNOSIS — E669 Obesity, unspecified: Secondary | ICD-10-CM | POA: Diagnosis not present

## 2018-07-17 DIAGNOSIS — J45909 Unspecified asthma, uncomplicated: Secondary | ICD-10-CM | POA: Insufficient documentation

## 2018-07-17 DIAGNOSIS — R000 Tachycardia, unspecified: Secondary | ICD-10-CM | POA: Insufficient documentation

## 2018-07-17 DIAGNOSIS — Z923 Personal history of irradiation: Secondary | ICD-10-CM | POA: Diagnosis not present

## 2018-07-17 DIAGNOSIS — M129 Arthropathy, unspecified: Secondary | ICD-10-CM

## 2018-07-17 DIAGNOSIS — E7800 Pure hypercholesterolemia, unspecified: Secondary | ICD-10-CM | POA: Insufficient documentation

## 2018-07-17 DIAGNOSIS — G893 Neoplasm related pain (acute) (chronic): Secondary | ICD-10-CM | POA: Diagnosis not present

## 2018-07-17 DIAGNOSIS — E1140 Type 2 diabetes mellitus with diabetic neuropathy, unspecified: Secondary | ICD-10-CM | POA: Diagnosis not present

## 2018-07-17 DIAGNOSIS — J90 Pleural effusion, not elsewhere classified: Secondary | ICD-10-CM

## 2018-07-17 LAB — CBC WITH DIFFERENTIAL/PLATELET
Abs Immature Granulocytes: 0 10*3/uL (ref 0.00–0.07)
Basophils Absolute: 0 10*3/uL (ref 0.0–0.1)
Basophils Relative: 1 %
Eosinophils Absolute: 0.2 10*3/uL (ref 0.0–0.5)
Eosinophils Relative: 6 %
HCT: 33.1 % — ABNORMAL LOW (ref 36.0–46.0)
Hemoglobin: 10.7 g/dL — ABNORMAL LOW (ref 12.0–15.0)
Immature Granulocytes: 0 %
Lymphocytes Relative: 18 %
Lymphs Abs: 0.7 10*3/uL (ref 0.7–4.0)
MCH: 30.3 pg (ref 26.0–34.0)
MCHC: 32.3 g/dL (ref 30.0–36.0)
MCV: 93.8 fL (ref 80.0–100.0)
Monocytes Absolute: 0.3 10*3/uL (ref 0.1–1.0)
Monocytes Relative: 7 %
Neutro Abs: 2.8 10*3/uL (ref 1.7–7.7)
Neutrophils Relative %: 68 %
Platelets: 285 10*3/uL (ref 150–400)
RBC: 3.53 MIL/uL — ABNORMAL LOW (ref 3.87–5.11)
RDW: 14.4 % (ref 11.5–15.5)
WBC: 4.1 10*3/uL (ref 4.0–10.5)
nRBC: 0 % (ref 0.0–0.2)

## 2018-07-17 LAB — CMP (CANCER CENTER ONLY)
ALT: 7 U/L (ref 0–44)
AST: 14 U/L — ABNORMAL LOW (ref 15–41)
Albumin: 3.4 g/dL — ABNORMAL LOW (ref 3.5–5.0)
Alkaline Phosphatase: 105 U/L (ref 38–126)
Anion gap: 11 (ref 5–15)
BUN: 9 mg/dL (ref 6–20)
CO2: 26 mmol/L (ref 22–32)
Calcium: 8.9 mg/dL (ref 8.9–10.3)
Chloride: 103 mmol/L (ref 98–111)
Creatinine: 0.92 mg/dL (ref 0.44–1.00)
GFR, Est AFR Am: >60 mL/min (ref >60)
GFR, Estimated: >60 mL/min (ref >60)
Glucose, Bld: 247 mg/dL — ABNORMAL HIGH (ref 70–99)
Potassium: 4.1 mmol/L (ref 3.5–5.1)
Sodium: 140 mmol/L (ref 135–145)
Total Bilirubin: 0.4 mg/dL (ref 0.3–1.2)
Total Protein: 8.1 g/dL (ref 6.5–8.1)

## 2018-07-17 LAB — TSH: TSH: 17.57 u[IU]/mL — ABNORMAL HIGH (ref 0.308–3.960)

## 2018-07-17 LAB — T4, FREE: Free T4: 0.77 ng/dL — ABNORMAL LOW (ref 0.82–1.77)

## 2018-07-17 NOTE — Telephone Encounter (Signed)
Medical records faxed to [place]; [place] [phone]

## 2018-07-18 ENCOUNTER — Telehealth: Payer: Self-pay | Admitting: Hematology

## 2018-07-18 ENCOUNTER — Ambulatory Visit
Admission: RE | Admit: 2018-07-18 | Discharge: 2018-07-18 | Disposition: A | Discharge status: Home / Self Care | Payer: Medicare Other | Source: Ambulatory Visit | Attending: Hematology | Admitting: Hematology

## 2018-07-18 ENCOUNTER — Telehealth: Payer: Self-pay | Admitting: *Deleted

## 2018-07-18 DIAGNOSIS — R0602 Shortness of breath: Secondary | ICD-10-CM | POA: Diagnosis not present

## 2018-07-18 DIAGNOSIS — C3491 Malignant neoplasm of unspecified part of right bronchus or lung: Secondary | ICD-10-CM

## 2018-07-18 NOTE — Telephone Encounter (Signed)
Called and scheduled appt per 4/13 los.  Spoke with the patient and patient she was going to go to  imaging to get her x-ray done and that she will also be making an appt for her scan while she is there.  Patient aware of the scheduled appt for 5/27

## 2018-07-18 NOTE — Telephone Encounter (Signed)
Contacted by [facility] Radiology - CXR completed today indicates RUL Pneumonia. Information given to Dr. Irene Limbo.

## 2018-07-20 ENCOUNTER — Ambulatory Visit: Payer: Medicare Other | Admitting: Rehabilitation

## 2018-07-21 ENCOUNTER — Other Ambulatory Visit: Payer: Self-pay

## 2018-07-21 ENCOUNTER — Ambulatory Visit
Admission: RE | Admit: 2018-07-21 | Discharge: 2018-07-21 | Disposition: A | Discharge status: Home / Self Care | Payer: Medicare Other | Source: Ambulatory Visit | Attending: Hematology | Admitting: Hematology

## 2018-07-21 DIAGNOSIS — C3491 Malignant neoplasm of unspecified part of right bronchus or lung: Secondary | ICD-10-CM

## 2018-07-21 DIAGNOSIS — R590 Localized enlarged lymph nodes: Secondary | ICD-10-CM | POA: Diagnosis not present

## 2018-07-25 ENCOUNTER — Encounter: Payer: Self-pay | Admitting: Rehabilitation

## 2018-07-25 ENCOUNTER — Ambulatory Visit: Payer: Medicare Other | Admitting: Rehabilitation

## 2018-07-25 ENCOUNTER — Other Ambulatory Visit: Payer: Self-pay

## 2018-07-25 DIAGNOSIS — I890 Lymphedema, not elsewhere classified: Secondary | ICD-10-CM

## 2018-07-25 DIAGNOSIS — R29898 Other symptoms and signs involving the musculoskeletal system: Secondary | ICD-10-CM | POA: Diagnosis not present

## 2018-07-25 DIAGNOSIS — R2689 Other abnormalities of gait and mobility: Secondary | ICD-10-CM

## 2018-07-25 DIAGNOSIS — M25611 Stiffness of right shoulder, not elsewhere classified: Secondary | ICD-10-CM | POA: Diagnosis not present

## 2018-07-25 NOTE — Therapy (Addendum)
[place], [place], [address] Phone: [phone]   Fax:  [phone]  Physical Therapy Treatment  Patient Details  Name: Marilyn [name] MRN: [id number] Date of Birth: [birth year] Referring Provider (PT): Dr. Irene Limbo   Encounter Date: 07/25/2018  PT End of Session - 07/25/18 1418    Visit Number  2    Number of Visits  8    Date for PT Re-Evaluation  09/07/18    Authorization Type  Medicare/Medicaid    PT Start Time  1103    PT Stop Time  1146    PT Time Calculation (min)  43 min    Activity Tolerance  Patient tolerated treatment well    Behavior During Therapy  [facility] for tasks assessed/performed       Past Medical History:  Diagnosis Date  . Arthritis   . Asthma   . Diabetes mellitus    Type II  . Hemorrhoids   . Hypercholesteremia   . Hypertension   . Hypothyroidism   . Metastatic lung cancer (metastasis from lung to other site) (HCC)    Lung, Mets to skin- right flank. CT angio 2019 suspicious for axillary, external iliac, right inguinal mets  . 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 NICM (nonischemic cardiomyopathy) ([place])    a. mild - EF 45-50% by echo 10/2016 but EF 52% by low risk nuc 11/2016. b. Echo 07/2017 - EF normal, mild AI, unremarkable echo otherwise.  . Obese   . Pneumonia 2017  . Sinus tachycardia    a. dates back to at least 2013, etiology unclear.    Past Surgical History:  Procedure Laterality Date  . CESAREAN SECTION    . COLONOSCOPY    . I/D  arm Right 2013  . IR FLUORO GUIDE PORT INSERTION RIGHT  02/18/2017  . IR GENERIC HISTORICAL  01/08/2016   IR US GUIDE VASC ACCESS RIGHT 01/08/2016 [name], DO [facility]  . IR GENERIC HISTORICAL  01/08/2016   IR FLUORO GUIDE CV LINE RIGHT 01/08/2016 [name], DO [facility]  . IR THORACENTESIS ASP PLEURAL SPACE W/IMG GUIDE  05/25/2018  . IR US GUIDE VASC ACCESS RIGHT  02/18/2017  . MULTIPLE EXTRACTIONS  WITH ALVEOLOPLASTY N/A 03/25/2017   Procedure: MULTIPLE EXTRACTION WITH ALVEOLOPLASTY (teeth #s four, six, seven, eight, nine, ten, eleven, one, twenty-three, twenty-four, twenty-five, twenty-six, twenty-nine, thirty);  Surgeon: [name], [name];  Location: [place];  Service: Oral Surgery;  Laterality: N/A;  . TUBAL LIGATION    . US guided core needle biopsy     of right lower neck/supraclavicular lymph nodes    There were no vitals filed for this visit.  Subjective Assessment - 07/25/18 1057    Subjective  I am alright.  Some pain in the breast.  I decided to stop my chemotherapy. I scratched my breast.  My arm hurts when I move it alot.  It isn't swollen today but still coming and going.     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 Extensive radiation changes involving the right paramediastinal lung. There is a focus of hypermetabolism in the right lower lobe which is suspicious for residual or recurrent tumor., hypothyroidism, CHF with EF of 40%, HTN, DM, currently getting Nivolumab q2 weeksbut pt may decide to stop this and  do palliative/hospice.  History of thoracentesis on 05/25/22 and negative for DVT Rt UE after hospitalization on 04/28/18.     Currently in Pain?  Yes  (Pended)     Pain Score  2   (Pended)     Pain Location  Breast  (Pended)     Pain Orientation  Right  (Pended)     Pain Descriptors / Indicators  Aching;Sharp  (Pended)     Pain Type  Chronic pain  (Pended)     Pain Onset  More than a month ago  (Pended)     Pain Frequency  Constant  (Pended)                        [facility] Adult PT Treatment/Exercise - 07/25/18 0001      Manual Therapy   Edema Management  have not heard back from [facility] regarding benefits.  Measured pt for Juzo wrap arm and gauntlet and sent over measurements for a large arm and medium hand in black  to be ordered if insurance works out.  Also checking with [employer] regarding bra benefits.  until we hear from them pt will wear fabricated 1/2" black foam in lightbulb shape from the breast into the axilla with a current sports bra.                    PT Long Term Goals - 07/13/18 1354      PT LONG TERM GOAL #1   Title  Pt will be educated on lymphedema and risk reduction strategies    Time  8    Period  Weeks    Status  New      PT LONG TERM GOAL #2   Title  Pt will obtain velcro garment with independence in use/bra as necessary    Time  8    Period  Weeks    Status  New      PT LONG TERM GOAL #3   Title  Pt will be ind with ROM and strengthening activities for the Rt UE    Time  8    Period  Weeks    Status  New      PT LONG TERM GOAL #4   Title  Pt will be assessed for gait, balance, and endurance    Time  8    Period  Weeks            Plan - 07/25/18 1419    Clinical Impression Statement  Pt with biggest complaint of breast discomfort today.  Fashioned foam piece for patient to wear in her sports bra while waiting for insurance verification on compression bras.  Pt reports the arm is still swelling intermittently and wants to get the velcro garment only if insurance will cover it.  Still waiting for verification.  Cancelled appts until garment information arrives.  Pt will get circular knit or  similar if velcro not covered.      PT Next Visit Plan  give pt lymphedema handout and risk reduction, benefits back?, velcro garment?, bra appt need to be set up?    Consulted and Agree with Plan of Care  Patient       Patient will benefit from skilled therapeutic intervention in order to improve the following deficits and impairments:     Visit Diagnosis: Lymphedema, not elsewhere classified  Stiffness of right shoulder, not elsewhere classified  Shoulder weakness  Other abnormalities of gait and mobility  Problem List Patient Active Problem List    Diagnosis Date Noted  . Acute lower UTI 04/30/2018  . Obese 04/30/2018  . Degenerative cervical spinal stenosis 04/30/2018  . HLD (hyperlipidemia) 04/29/2018  . Asthma 04/29/2018  . Depression 04/29/2018  . Chronic diastolic CHF (congestive heart failure) ([place]) 04/29/2018  . Left arm weakness and numbness 04/29/2018  . Right arm weakness 04/29/2018  . Right sided numbness   . 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 Counseling regarding advance care planning and goals of care 01/03/2018  . Essential hypertension   . DCM (dilated cardiomyopathy) ([place])   . Non-ischemic cardiomyopathy ([place]) 02/04/2017  . 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 Acute systolic heart failure ([place]) 12/01/2016  . Primary cancer of right lung metastatic to other site ([place]) 11/24/2016  . SOB (shortness of breath) 10/26/2016  . Hypothyroidism 08/22/2016  . PICC (peripherally inserted central catheter) flush 07/16/2016  . Community acquired pneumonia   . Sinus tachycardia   . Thrush of mouth and esophagus ([place]) 04/07/2016  . Constipation 04/07/2016  . Palliative care by specialist   . Port catheter in place 01/19/2016  . Dehydration 01/14/2016  . Tachycardia 01/14/2016  . Adenocarcinoma of left lung, stage 3 ([place]) 01/01/2016  . Non-small cell lung cancer ([place]) 12/31/2015  . Mediastinal mass   . Chest pain 12/26/2015  . Headache 12/26/2015  . Diabetes mellitus without complication ([place]) 14/01/3012    Shan Levans, PT 07/25/2018, 2:22 PM  [place], [place], [address] Phone: [phone]   Fax:  [phone]  Name: Marilyn [name] MRN: [id number] Date of Birth: [birth year]  PHYSICAL THERAPY DISCHARGE SUMMARY  Visits from Start of Care: 2  Current functional level related to goals / functional outcomes: Per chart review pt has transitioned to HHPT and palliative options.  Will DC form OPPT at  this time.    Remaining deficits: See above   Education / Equipment: Foam for bras Plan: Patient agrees to discharge.  Patient goals were not met. Patient is being discharged due to a change in medical status.  ?????    Shan Levans, PT

## 2018-07-25 NOTE — Patient Instructions (Signed)
Wear foam in sports bra

## 2018-07-27 ENCOUNTER — Telehealth: Payer: Self-pay | Admitting: Rehabilitation

## 2018-07-27 NOTE — Telephone Encounter (Signed)
Called pt to let her know that bras should be covered by her [employer] with authorized approval.  PT office will obtain prescription and let her know when it has been received so she can get an appt to be fitted.

## 2018-08-09 ENCOUNTER — Telehealth: Payer: Self-pay | Admitting: Physician Assistant

## 2018-08-09 NOTE — Telephone Encounter (Signed)
Smartphone/ my chart/ consent/ pre reg completed °

## 2018-08-10 ENCOUNTER — Telehealth (INDEPENDENT_AMBULATORY_CARE_PROVIDER_SITE_OTHER): Payer: Medicare Other | Admitting: Physician Assistant

## 2018-08-10 ENCOUNTER — Telehealth: Payer: Self-pay | Admitting: Rehabilitation

## 2018-08-10 ENCOUNTER — Encounter: Payer: Self-pay | Admitting: Physician Assistant

## 2018-08-10 ENCOUNTER — Telehealth: Payer: Self-pay

## 2018-08-10 DIAGNOSIS — E785 Hyperlipidemia, unspecified: Secondary | ICD-10-CM

## 2018-08-10 DIAGNOSIS — I471 Supraventricular tachycardia: Secondary | ICD-10-CM | POA: Diagnosis not present

## 2018-08-10 DIAGNOSIS — E039 Hypothyroidism, unspecified: Secondary | ICD-10-CM

## 2018-08-10 DIAGNOSIS — C3490 Malignant neoplasm of unspecified part of unspecified bronchus or lung: Secondary | ICD-10-CM

## 2018-08-10 DIAGNOSIS — I10 Essential (primary) hypertension: Secondary | ICD-10-CM

## 2018-08-10 DIAGNOSIS — E119 Type 2 diabetes mellitus without complications: Secondary | ICD-10-CM

## 2018-08-10 MED ORDER — METOPROLOL SUCCINATE ER 50 MG PO TB24: ORAL_TABLET | ORAL | 3 refills | Status: AC

## 2018-08-10 NOTE — Telephone Encounter (Signed)
Called pt to let her know that compression bras will not be covered without breast cancer / removal diagnosis and advised pt to continue using a sports bra with foam insert for compression option.

## 2018-08-10 NOTE — Telephone Encounter (Signed)
Virtual Visit Pre-Appointment Phone Call  "(Name), I am calling you today to discuss your upcoming appointment. We are currently trying to limit exposure to the virus that causes COVID-19 by seeing patients at home rather than in the office."  1. "What is the BEST phone number to call the day of the visit?" - include this in appointment notes  2. "Do you have or have access to (through a family member/friend) a smartphone with video capability that we can use for your visit?" a. If yes - list this number in appt notes as "cell" (if different from BEST phone #) and list the appointment type as a VIDEO visit in appointment notes b. If no - list the appointment type as a PHONE visit in appointment notes  3. Confirm consent - "In the setting of the current Covid19 crisis, you are scheduled for a PHONE visit with your provider on 08/10/2018 at 8:00AM.  Just as we do with many in-office visits, in order for you to participate in this visit, we must obtain consent.  If you'd like, I can send this to your mychart (if signed up) or email for you to review.  Otherwise, I can obtain your verbal consent now.  All virtual visits are billed to your insurance company just like a normal visit would be.  By agreeing to a virtual visit, we'd like you to understand that the technology does not allow for your provider to perform an examination, and thus may limit your provider's ability to fully assess your condition. If your provider identifies any concerns that need to be evaluated in person, we will make arrangements to do so.  Finally, though the technology is pretty good, we cannot assure that it will always work on either your or our end, and in the setting of a video visit, we may have to convert it to a phone-only visit.  In either situation, we cannot ensure that we have a secure connection.  Are you willing to proceed?" STAFF: Did the patient verbally acknowledge consent to telehealth visit? Document YES/NO  here: YES  4. Advise patient to be prepared - "Two hours prior to your appointment, go ahead and check your blood pressure, pulse, oxygen saturation, and your weight (if you have the equipment to check those) and write them all down. When your visit starts, your provider will ask you for this information. If you have an Apple Watch or Kardia device, please plan to have heart rate information ready on the day of your appointment. Please have a pen and paper handy nearby the day of the visit as well."  5. Give patient instructions for MyChart download to smartphone OR Doximity/Doxy.me as below if video visit (depending on what platform provider is using)  6. Inform patient they will receive a phone call 15 minutes prior to their appointment time (may be from unknown caller ID) so they should be prepared to answer    TELEPHONE CALL NOTE  CALLAHAN PEDDIE has been deemed a candidate for a follow-up tele-health visit to limit community exposure during the Covid-19 pandemic. I spoke with the patient via phone to ensure availability of phone/video source, confirm preferred email & phone number, and discuss instructions and expectations.  I reminded Nessa Ramaker Autry to be prepared with any vital sign and/or heart rhythm information that could potentially be obtained via home monitoring, at the time of her visit. I reminded Sejla Marzano Riffe to expect a phone call prior to her visit.  Jacqulynn Cadet, [place] 08/10/2018 7:58 AM   INSTRUCTIONS FOR DOWNLOADING THE MYCHART APP TO SMARTPHONE  - The patient must first make sure to have activated MyChart and know their login information - If Apple, go to [employer] and type in MyChart in the search bar and download the app. If Android, ask patient to go to [employer] and type in [place] in the search bar and download the app. The app is free but as with any other app downloads, their phone may require them to verify saved payment information or Apple/Android  password.  - The patient will need to then log into the app with their MyChart username and password, and select [facility] as their healthcare provider to link the account. When it is time for your visit, go to the MyChart app, find appointments, and click Begin Video Visit. Be sure to Select Allow for your device to access the Microphone and Camera for your visit. You will then be connected, and your provider will be with you shortly.  **If they have any issues connecting, or need assistance please contact MyChart service desk [phone])**  **If using a computer, in order to ensure the best quality for their visit they will need to use either of the following Internet Browsers: [employer], or Google Chrome**  IF USING DOXIMITY or DOXY.ME - The patient will receive a link just prior to their visit by text.     FULL LENGTH CONSENT FOR TELE-HEALTH VISIT   I hereby voluntarily request, consent and authorize [facility] and its employed or contracted physicians, physician assistants, nurse practitioners or other licensed health care professionals (the Practitioner), to provide me with telemedicine health care services (the "Services") as deemed necessary by the treating Practitioner. I acknowledge and consent to receive the Services by the Practitioner via telemedicine. I understand that the telemedicine visit will involve communicating with the Practitioner through live audiovisual communication technology and the disclosure of certain medical information by electronic transmission. I acknowledge that I have been given the opportunity to request an in-person assessment or other available alternative prior to the telemedicine visit and am voluntarily participating in the telemedicine visit.  I understand that I have the right to withhold or withdraw my consent to the use of telemedicine in the course of my care at any time, without affecting my right to future care or treatment,  and that the Practitioner or I may terminate the telemedicine visit at any time. I understand that I have the right to inspect all information obtained and/or recorded in the course of the telemedicine visit and may receive copies of available information for a reasonable fee.  I understand that some of the potential risks of receiving the Services via telemedicine include:  [name] Delay or interruption in medical evaluation due to technological equipment failure or disruption; . Information transmitted may not be sufficient (e.g. poor resolution of images) to allow for appropriate medical decision making by the Practitioner; and/or  . In rare instances, security protocols could fail, causing a breach of personal health information.  Furthermore, I acknowledge that it is my responsibility to provide information about my medical history, conditions and care that is complete and accurate to the best of my ability. I acknowledge that Practitioner's advice, recommendations, and/or decision may be based on factors not within their control, such as incomplete or inaccurate data provided by me or distortions of diagnostic images or specimens that may result from electronic transmissions. I understand that the  practice of medicine is not an Chief Strategy Officer and that Practitioner makes no warranties or guarantees regarding treatment outcomes. I acknowledge that I will receive a copy of this consent concurrently upon execution via email to the email address I last provided but may also request a printed copy by calling the office of [facility].    I understand that my insurance will be billed for this visit.   I have read or had this consent read to me. . I understand the contents of this consent, which adequately explains the benefits and risks of the Services being provided via telemedicine.  . I have been provided ample opportunity to ask questions regarding this consent and the Services and have had my questions  answered to my satisfaction. . I give my informed consent for the services to be provided through the use of telemedicine in my medical care  By participating in this telemedicine visit I agree to the above.

## 2018-08-10 NOTE — Patient Instructions (Addendum)
Medication Instructions:   Your physician recommends that you continue on your current medications as directed. Please refer to the Current Medication list given to you today.  If you need a refill on your cardiac medications before your next appointment, please call your pharmacy.   Lab work:  NONE ordered at this time of appointment   If you have labs (blood work) drawn today and your tests are completely normal, you will receive your results only by: [name] MyChart Message (if you have MyChart) OR . A paper copy in the mail If you have any lab test that is abnormal or we need to change your treatment, we will call you to review the results.  Testing/Procedures:  NONE ordered at this time of appointment   Follow-Up: At [facility], you and your health needs are our priority.  As part of our continuing mission to provide you with exceptional heart care, we have created designated Provider Care Teams.  These Care Teams include your primary Cardiologist (physician) and Advanced Practice Providers (APPs -  Physician Assistants and Nurse Practitioners) who all work together to provide you with the care you need, when you need it. You will need a follow up appointment in 6 months.  Please call our office 2 months in advance to schedule this appointment.  You may see Pixie Casino, MD or one of the following Advanced Practice Providers on your designated Care Team: [place], [place] . Fabian Sharp, PA-C  Any Other Special Instructions Will Be Listed Below (If Applicable).   Monitor your Heart Rate at home  If your heart rate at rest (sitting, laying) is 120 BPM (Beats per minute) or greater  call our office

## 2018-08-10 NOTE — Progress Notes (Signed)
Virtual Visit via Telephone Note   This visit type was conducted due to national recommendations for restrictions regarding the COVID-19 Pandemic (e.g. social distancing) in an effort to limit this patient's exposure and mitigate transmission in our community.  Due to her co-morbid illnesses, this patient is at least at moderate risk for complications without adequate follow up.  This format is felt to be most appropriate for this patient at this time.  The patient did not have access to video technology/had technical difficulties with video requiring transitioning to audio format only (telephone).  All issues noted in this document were discussed and addressed.  No physical exam could be performed with this format.  Please refer to the patient's chart for her  consent to telehealth for [facility].   Date:  08/11/2018   ID:  Marilyn [name], DOB [birth year], MRN [id number]  Patient Location: Home Provider Location: Home  PCP:  [name], FNP  Cardiologist:  Pixie Casino, MD  Electrophysiologist:  None   Evaluation Performed:  Follow-Up Visit  Chief Complaint:  followup  History of Present Illness:    Marilyn [name] is a 53 y.o. female with stage IV lung cancer s/p chemoradiation therapy, DM 2, hypertension, hypothyroidism, hyperlipidemia, nonischemic cardiomyopathy with improved EF and tachycardia.  She has had several years of tachycardia.  Metoprolol was previously discontinued as it worsened her asthma.  She uses diltiazem for rate control.  EF was normal 55 to 60% by echocardiogram in September 2017.  Repeat echocardiogram on 11/02/2016 showed slight drop in ejection fraction down to 45 to 50%.  Myoview obtained on 11/05/2016 showed EF 52%, medium defect of moderate severity present in the mid anterior and apical anterior location consistent with soft tissue attenuation, no ischemia.  Repeat echocardiogram by April 2019 showed improvement in EF up to 55 to 60%.  Her last  echocardiogram obtained on 07/28/2017 is unchanged when compared to earlier echocardiogram, it did show mild aortic regurgitation the mitral regurgitation.  She was last seen on 01/17/2018 by Dr. Debara Pickett, her Toprol-XL has been increased to 50 mg daily previously.  She remained tachycardic at the time.  Her Toprol-XL was increased to 50 mg a.m. and 25 mg p.m.  Since late last year, she has been undergoing palliative radiation and chemo therapy for metastatic lung cancer.  She was first diagnosed with lung cancer in 2017 however had a recurrence at the metastasis in September 2018.  She was admitted in January 2019 for right arm weakness and numbness.  MRI and MRA was negative for stroke.  Although discharge paperwork mentioned echocardiogram, I do not see this being done.  Carotid Doppler obtained on 05/03/2018 showed only mild 1 to 39% bilateral ICA disease.  Upper extremity venous Doppler was negative for DVT.  Patient was contacted today via telephone visit.  She is overall stable from cardiology perspective.  She denies any significant chest discomfort or shortness of breath.  She does have some degree of shortness of breath with exertion, however this is unchanged.  She denies any lower extremity edema, orthopnea or PND.  The current 50 mg a.m. and 25 mg p.m. of Toprol-XL seems to be controlling her heart rate pretty well.  She does not have any vital signs today however based on the last vital signs obtained in February 2020 her office visit, her heart rate seems to be fairly controlled on this dosage.  The patient does not have symptoms concerning for COVID-19 infection (fever, chills,  cough, or new shortness of breath).    Past Medical History:  Diagnosis Date  . Arthritis   . Asthma   . Diabetes mellitus    Type II  . Hemorrhoids   . Hypercholesteremia   . Hypertension   . Hypothyroidism   . Metastatic lung cancer (metastasis from lung to other site) (HCC)    Lung, Mets to skin- right flank.  CT angio 2019 suspicious for axillary, external iliac, right inguinal mets  . 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 NICM (nonischemic cardiomyopathy) ([place])    a. mild - EF 45-50% by echo 10/2016 but EF 52% by low risk nuc 11/2016. b. Echo 07/2017 - EF normal, mild AI, unremarkable echo otherwise.  . Obese   . Pneumonia 2017  . Sinus tachycardia    a. dates back to at least 2013, etiology unclear.   Past Surgical History:  Procedure Laterality Date  . CESAREAN SECTION    . COLONOSCOPY    . I/D  arm Right 2013  . IR FLUORO GUIDE PORT INSERTION RIGHT  02/18/2017  . IR GENERIC HISTORICAL  01/08/2016   IR US GUIDE VASC ACCESS RIGHT 01/08/2016 [name], DO [facility]  . IR GENERIC HISTORICAL  01/08/2016   IR FLUORO GUIDE CV LINE RIGHT 01/08/2016 [name], DO [facility]  . IR THORACENTESIS ASP PLEURAL SPACE W/IMG GUIDE  05/25/2018  . IR US GUIDE VASC ACCESS RIGHT  02/18/2017  . MULTIPLE EXTRACTIONS WITH ALVEOLOPLASTY N/A 03/25/2017   Procedure: MULTIPLE EXTRACTION WITH ALVEOLOPLASTY (teeth #s four, six, seven, eight, nine, ten, eleven, one, twenty-three, twenty-four, twenty-five, twenty-six, twenty-nine, thirty);  Surgeon: [name], [name];  Location: [place];  Service: Oral Surgery;  Laterality: N/A;  . TUBAL LIGATION    . US guided core needle biopsy     of right lower neck/supraclavicular lymph nodes     Current Meds  Medication Sig  . ACCU-CHEK AVIVA PLUS test strip CHECK BLOOD SUGAR THREE TIMES A DAY BEFORE MEALS DIRECTED  . ASPIRIN LOW DOSE 81 MG EC tablet TAKE 1 TABLET BY MOUTH EVERY DAY (Patient taking differently: Take 81 mg by mouth daily. Enteric Coated.  ** DO NOT CRUSH **)  . atorvastatin (LIPITOR) 40 MG tablet Take 1 tablet (40 mg total) by mouth daily at 6 PM.  . DULoxetine (CYMBALTA) 60 MG capsule TAKE 1 CAPSULE BY MOUTH EVERY DAY (Patient taking differently: Take 60 mg by mouth daily. )  . fluconazole (DIFLUCAN) 200 MG tablet Take 1 tablet (200  mg total) by mouth daily.  . hydrOXYzine (ATARAX/VISTARIL) 10 MG tablet Take 1 tablet (10 mg total) by mouth 3 (three) times daily as needed for itching.  . insulin aspart (NOVOLOG) 100 UNIT/ML FlexPen As per sliding scale instructions (Patient taking differently: Inject 0-10 Units into the skin 3 (three) times daily. As per sliding scale instructions)  . Insulin Degludec (TRESIBA FLEXTOUCH) 200 UNIT/ML SOPN Inject 100 Units into the skin daily before breakfast.   . levalbuterol (XOPENEX HFA) 45 MCG/ACT inhaler Inhale 1-2 puffs into the lungs every 8 (eight) hours as needed for wheezing.  [name] levothyroxine (SYNTHROID, LEVOTHROID) 150 MCG tablet TAKE 1 TABLET BY MOUTH EVERY DAY BEFORE BREAKFAST (Patient taking differently: Take 150 mcg by mouth daily before breakfast. )  . lidocaine-prilocaine (EMLA) cream Apply 1 application topically as needed.  . metoprolol succinate (TOPROL-XL) 50 MG 24 hr tablet Take 1 tablet (50mg ) by mouth in the morning and 1/2 tablet (  25mg ) in the evening. Take with or immediately following a meal.  . omeprazole (PRILOSEC) 20 MG capsule Take by mouth daily.  [name] oxyCODONE-acetaminophen (PERCOCET) 5-325 MG tablet Take 1 tablet by mouth every 6 (six) hours as needed for moderate pain or severe pain.  . pregabalin (LYRICA) 50 MG capsule Take 50 mg by mouth 3 (three) times daily.  [name] senna-docusate (SENNA S) 8.6-50 MG tablet Take 2 tablets by mouth 2 (two) times daily. May reduce to 2 tab po HS as needed once regular BM estabished (Patient taking differently: Take 2 tablets by mouth 2 (two) times daily as needed (FOR CONSTIPATION). =)  . Vitamins A & D (VITAMIN A & D) OINT Apply 1 application topically 2 (two) times daily.  . [DISCONTINUED] metoprolol succinate (TOPROL-XL) 50 MG 24 hr tablet Take 1 tablet (50mg ) by mouth in the morning and 1/2 tablet (25mg ) in the evening. Take with or immediately following a meal. (Patient taking differently: Take 25-50 mg by mouth See admin  instructions. Take 1 tablet (50mg ) by mouth in the morning and 1/2 tablet (25mg ) in the evening. Take with or immediately following a meal.)     Allergies:   Patient has no known allergies.   Social History   Tobacco Use  . Smoking status: Never Smoker  . Smokeless tobacco: Never Used  Substance Use Topics  . Alcohol use: No  . Drug use: No     Family Hx: The patient's family history includes Heart failure in her mother; Hypertension in her mother. There is no history of Cancer or Rheumatologic disease.  ROS:   Please see the history of present illness.     All other systems reviewed and are negative.   Prior CV studies:   The following studies were reviewed today:  Echo 11/02/2016 LV EF: 45% -   50%  Study Conclusions  - Left ventricle: The cavity size was normal. Wall thickness was   increased in a pattern of mild LVH. There was mild focal basal   hypertrophy of the septum. Systolic function was mildly reduced.   The estimated ejection fraction was in the range of 45% to 50%.   Diffuse hypokinesis. Doppler parameters are consistent with   restrictive physiology, indicative of decreased left ventricular   diastolic compliance and/or increased left atrial pressure. - Aortic valve: There was mild regurgitation. - Pericardium, extracardiac: A small pericardial effusion was   identified.  Impressions:  - Mild global reduction in LV systolic function; mild LVH;   restrictive filling; mild AI.   Myoview 11/05/2016 Study Highlights     The left ventricular ejection fraction is mildly decreased (45-54%).  Nuclear stress EF: 52%.  There was no ST segment deviation noted during stress.  Defect 1: There is a medium defect of moderate severity present in the mid anterior and apical anterior location.  This is a low risk study.   Probably normal, low risk stress nuclear study with soft tissue attenuation; no ischemia; EF 52 but visually appears better; normal wall  motion.     Labs/Other Tests and Data Reviewed:    EKG:  An ECG dated 04/28/2018 was personally reviewed today and demonstrated:  Atrial tachycardia.  Recent Labs: 07/17/2018: ALT 7; BUN 9; Creatinine 0.92; Hemoglobin 10.7; Platelets 285; Potassium 4.1; Sodium 140; TSH 17.570   Recent Lipid Panel No results found for: CHOL, TRIG, HDL, CHOLHDL, LDLCALC, LDLDIRECT  Wt Readings from Last 3 Encounters:  07/17/18 277 lb 9.6 oz (125.9 kg)  06/19/18  284 lb 11.2 oz (129.1 kg)  05/30/18 276 lb 6.4 oz (125.4 kg)     Objective:    Vital Signs:  LMP 01/21/2016 Comment: pt signed preg test waiver 04/16/16    VITAL SIGNS:  reviewed  Despite our best effort, she was unable to obtain any vital signs today.  ASSESSMENT & PLAN:    1. Atrial tachycardia: Better controlled after metoprolol succinate was increased to 50 mg a.m. and 25 mg p.m.  Unfortunately she did not provide any vital signs today, therefore I am unable to uptitrate her beta-blocker dosage.  However based on previous vital sign that was obtained during her oncology visit in February, her heart rate seems to be quite well controlled on the current dosage.  2. Metastatic lung cancer: Followed by oncology.  Received palliative radiation therapy near the end of 2019, currently receiving chemotherapy.  3. Hypertension: Continue on current therapy.  She did not provide any vital signs today.  4. Hyperlipidemia: On Lipitor 40 mg daily.  5. DM2: Managed by primary care provider.  On insulin.  6. Hypothyroidism: On Synthroid.  COVID-19 Education: The signs and symptoms of COVID-19 were discussed with the patient and how to seek care for testing (follow up with PCP or arrange E-visit).  The importance of social distancing was discussed today.  Time:   Today, I have spent 9 minutes with the patient with telehealth technology discussing the above problems.     Medication Adjustments/Labs and Tests Ordered: Current medicines are  reviewed at length with the patient today.  Concerns regarding medicines are outlined above.   Tests Ordered: No orders of the defined types were placed in this encounter.   Medication Changes: Meds ordered this encounter  Medications  . metoprolol succinate (TOPROL-XL) 50 MG 24 hr tablet    Sig: Take 1 tablet (50mg ) by mouth in the morning and 1/2 tablet (25mg ) in the evening. Take with or immediately following a meal.    Dispense:  135 tablet    Refill:  3    Disposition:  Follow up in 6 month(s)  Signed, Almyra Deforest, PA  08/11/2018 9:45 AM    [place]

## 2018-08-18 ENCOUNTER — Ambulatory Visit: Payer: Self-pay | Admitting: Internal Medicine

## 2018-08-23 ENCOUNTER — Other Ambulatory Visit: Payer: Self-pay | Admitting: Hematology

## 2018-08-29 NOTE — Progress Notes (Signed)
[name]    HEMATOLOGY/ONCOLOGY CLINIC NOTE  Date of Service: 08/30/18     Patient Care Team: [name], FNP as PCP - General (Nurse Practitioner) Pixie Casino, MD as PCP - Cardiology (Cardiology)  CHIEF COMPLAINTS  F/u for continued management of metastatic lung adenocarcinoma  Diagnosis Metastatic Lung Adenocarcinoma with rt flank subcutaneous metastasis.  Current treatment:  Nivolumab q2 weeks Palliative RT to pain rt inguinal mass   PreviousTreatment -Concurrent Chemo-radiation with Carboplatin/Taxol. Completed RT on 02/13/2016 and has then completed 2 cycles of carboplatin + taxol. -Started Maintenance Durvalumab from  05/24/2016 q2weeks, switched to Nivolumab q2weeks on 12/13/16 once metastatic disease noted -s/p palliative RT to rt sided Subcutaneous metastatic mass.    HISTORY OF PRESENTING ILLNESS: Plz see my previous note for details on initial presentation.  INTERVAL HISTORY   Marilyn [name] presents to the office today for followup of her metastatic lung cancer. The patient's last visit with [place] was on 07/17/18. The pt reports that she is doing well overall.  The pt reports that she has an occasional sharp pain in her lower right abdomen when she eats. She notes that her right breast continues to have intermittent discomfort. She notes that she is "SOB when she walks sometimes," which she notices more, denies leg swelling. She notes that her previous right axillary enlargement has reduced in size. She notes that she has some eczema on the outer sides of both of her breasts.  The pt notes that "a lot of times she doesn't feel like eating." She has lost about 16 pounds in the last 2 months. However, the pt notes that overall she "feels fine."  Of note since the patient's last visit, pt has had a CT Chest completed on 07/21/18 with results revealing "Progressive left supraclavicular lymphadenopathy. Right axillary, mediastinal and hilar adenopathy is grossly  stable, suboptimally evaluated without contrast. These findings remain consistent with metastatic disease. 2. Slight enlargement of moderate-sized right pleural effusion with extension into the minor fissure. This limits evaluation of the previously demonstrated right middle lobe nodular density. This effusion could be malignant. No discretely enlarging pulmonary nodules identified. 3. Stable skin thickening in the right breast. 4. PET CT follow-up may be helpful to assess for hypermetabolism in the lymph nodes and right pleural space."  Lab results today (08/30/18) of CBC w/diff and CMP is as follows: all values are WNL except for RBC at 3.32, HGB at 10.0, HCT at 31.6, Glucose at 306, Albumin at 3.0, AST at 13.  On review of systems, pt reports occasional right breast discomfort, weight loss, not eating well, SOB with walking, good energy levels, and denies leg swelling, and any other symptoms.   MEDICAL HISTORY:  Past Medical History:  Diagnosis Date  . Arthritis   . Asthma   . Diabetes mellitus    Type II  . Hemorrhoids   . Hypercholesteremia   . Hypertension   . Hypothyroidism   . Metastatic lung cancer (metastasis from lung to other site) (HCC)    Lung, Mets to skin- right flank. CT angio 2019 suspicious for axillary, external iliac, right inguinal mets  . 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 NICM (nonischemic cardiomyopathy) ([place])    a. mild - EF 45-50% by echo 10/2016 but EF 52% by low risk nuc 11/2016. b. Echo 07/2017 - EF normal, mild AI, unremarkable echo otherwise.  . Obese   . Pneumonia 2017  . Sinus tachycardia  a. dates back to at least 2013, etiology unclear.    SURGICAL HISTORY: Past Surgical History:  Procedure Laterality Date  . CESAREAN SECTION    . COLONOSCOPY    . I/D  arm Right 2013  . IR FLUORO GUIDE PORT INSERTION RIGHT  02/18/2017  . IR GENERIC HISTORICAL  01/08/2016   IR US GUIDE VASC ACCESS RIGHT 01/08/2016 [name], DO [facility]  RAD  . IR GENERIC HISTORICAL  01/08/2016   IR FLUORO GUIDE CV LINE RIGHT 01/08/2016 [name], DO [facility]  . IR THORACENTESIS ASP PLEURAL SPACE W/IMG GUIDE  05/25/2018  . IR US GUIDE VASC ACCESS RIGHT  02/18/2017  . MULTIPLE EXTRACTIONS WITH ALVEOLOPLASTY N/A 03/25/2017   Procedure: MULTIPLE EXTRACTION WITH ALVEOLOPLASTY (teeth #s four, six, seven, eight, nine, ten, eleven, one, twenty-three, twenty-four, twenty-five, twenty-six, twenty-nine, thirty);  Surgeon: [name], [name];  Location: [place];  Service: Oral Surgery;  Laterality: N/A;  . TUBAL LIGATION    . US guided core needle biopsy     of right lower neck/supraclavicular lymph nodes    SOCIAL HISTORY: Social History   Socioeconomic History  . Marital status: Divorced    Spouse name: Not on file  . Number of children: Not on file  . Years of education: Not on file  . Highest education level: Not on file  Occupational History  . Not on file  Social Needs  . Financial resource strain: Not on file  . Food insecurity:    Worry: Not on file    Inability: Not on file  . Transportation needs:    Medical: No    Non-medical: No  Tobacco Use  . Smoking status: Never Smoker  . Smokeless tobacco: Never Used  Substance and Sexual Activity  . Alcohol use: No  . Drug use: No  . Sexual activity: Not Currently  Lifestyle  . Physical activity:    Days per week: Not on file    Minutes per session: Not on file  . Stress: Not on file  Relationships  . Social connections:    Talks on phone: Not on file    Gets together: Not on file    Attends religious service: Not on file    Active member of club or organization: Not on file    Attends meetings of clubs or organizations: Not on file    Relationship status: Not on file  . Intimate partner violence:    Fear of current or ex partner: No    Emotionally abused: No    Physically abused: No    Forced sexual activity: No  Other Topics Concern  . Not on file  Social History  Narrative   No bird or mold exposure. No recent travel.   11-23-17 Unable to ask abuse questions mother with her today.    FAMILY HISTORY: Family History  Problem Relation Age of Onset  . Heart failure Mother   . Hypertension Mother   . Cancer Neg Hx   . Rheumatologic disease Neg Hx     ALLERGIES:  has No Known Allergies.  MEDICATIONS:  Current Outpatient Medications  Medication Sig Dispense Refill  . ACCU-CHEK AVIVA PLUS test strip CHECK BLOOD SUGAR THREE TIMES A DAY BEFORE MEALS DIRECTED    . amoxicillin-clavulanate (AUGMENTIN) 875-125 MG tablet Take 1 tablet by mouth 2 (two) times daily. (Patient not taking: Reported on 08/10/2018) 14 tablet 0  . ASPIRIN LOW DOSE 81 MG EC tablet TAKE 1 TABLET BY MOUTH EVERY DAY (Patient  taking differently: Take 81 mg by mouth daily. Enteric Coated.  ** DO NOT CRUSH **) 60 tablet 2  . atorvastatin (LIPITOR) 40 MG tablet Take 1 tablet (40 mg total) by mouth daily at 6 PM. 30 tablet 0  . DULoxetine (CYMBALTA) 60 MG capsule TAKE 1 CAPSULE BY MOUTH EVERY DAY (Patient taking differently: Take 60 mg by mouth daily. ) 30 capsule 2  . fluconazole (DIFLUCAN) 200 MG tablet Take 1 tablet (200 mg total) by mouth daily. 1 tablet 0  . hydrOXYzine (ATARAX/VISTARIL) 10 MG tablet Take 1 tablet (10 mg total) by mouth 3 (three) times daily as needed for itching. 30 tablet 0  . insulin aspart (NOVOLOG) 100 UNIT/ML FlexPen As per sliding scale instructions (Patient taking differently: Inject 0-10 Units into the skin 3 (three) times daily. As per sliding scale instructions) 15 mL 2  . Insulin Degludec (TRESIBA FLEXTOUCH) 200 UNIT/ML SOPN Inject 100 Units into the skin daily before breakfast.     . levalbuterol (XOPENEX HFA) 45 MCG/ACT inhaler Inhale 1-2 puffs into the lungs every 8 (eight) hours as needed for wheezing. 1 Inhaler 12  . levothyroxine (SYNTHROID, LEVOTHROID) 150 MCG tablet TAKE 1 TABLET BY MOUTH EVERY DAY BEFORE BREAKFAST (Patient taking differently: Take 150  mcg by mouth daily before breakfast. ) 30 tablet 2  . lidocaine-prilocaine (EMLA) cream Apply 1 application topically as needed. 30 g 6  . metoprolol succinate (TOPROL-XL) 50 MG 24 hr tablet Take 1 tablet (71m) by mouth in the morning and 1/2 tablet (226m in the evening. Take with or immediately following a meal. 135 tablet 3  . naproxen (NAPROSYN) 500 MG tablet Take 1 tablet (500 mg total) by mouth 2 (two) times daily with a meal. (Patient not taking: Reported on 08/10/2018) 20 tablet 0  . nitrofurantoin, macrocrystal-monohydrate, (MACROBID) 100 MG capsule Take 1 capsule (100 mg total) by mouth every 12 (twelve) hours. (Patient not taking: Reported on 08/10/2018) 8 capsule 0  . omeprazole (PRILOSEC) 20 MG capsule Take by mouth daily.    . [name] KitchenxyCODONE-acetaminophen (PERCOCET) 5-325 MG tablet Take 1 tablet by mouth every 6 (six) hours as needed for moderate pain or severe pain. 50 tablet 0  . pregabalin (LYRICA) 50 MG capsule Take 50 mg by mouth 3 (three) times daily.    . [name] Kitchenenna-docusate (SENNA S) 8.6-50 MG tablet Take 2 tablets by mouth 2 (two) times daily. May reduce to 2 tab po HS as needed once regular BM estabished (Patient taking differently: Take 2 tablets by mouth 2 (two) times daily as needed (FOR CONSTIPATION). =) 60 tablet 1  . traZODone (DESYREL) 50 MG tablet Take 1-2 tablets (50-100 mg total) by mouth at bedtime as needed for sleep. (Patient not taking: Reported on 08/10/2018) 60 tablet 0  . Vitamins A & D (VITAMIN A & D) OINT Apply 1 application topically 2 (two) times daily. 1 Tube 3   No current facility-administered medications for this visit.    Facility-Administered Medications Ordered in Other Visits  Medication Dose Route Frequency Provider Last Rate Last Dose  . sodium chloride 0.9 % injection 10 mL  10 mL Intravenous PRN KaBrunetta GeneraMD   10 mL at 05/24/16 1252    REVIEW OF SYSTEMS:    A 10+ POINT REVIEW OF SYSTEMS WAS OBTAINED including neurology, dermatology,  psychiatry, cardiac, respiratory, lymph, extremities, GI, GU, Musculoskeletal, constitutional, breasts, reproductive, HEENT.  All pertinent positives are noted in the HPI.  All others are negative.   PHYSICAL EXAMINATION:  ECOG PERFORMANCE STATUS: 1 - Symptomatic but completely ambulatory  Vitals:   08/30/18 1219  BP: (!) 146/87  Pulse: (!) 127  Resp: 18  Temp: 98.5 F (36.9 C)  SpO2: 98%   Filed Weights   08/30/18 1219  Weight: 268 lb 14.4 oz (122 kg)   .Body mass index is 39.71 kg/m.  GENERAL:alert, in no acute distress and comfortable SKIN: no acute rashes, no significant lesions EYES: conjunctiva are pink and non-injected, sclera anicteric OROPHARYNX: MMM, no exudates, no oropharyngeal erythema or ulceration NECK: supple, no JVD LYMPH:  no palpable lymphadenopathy in the cervical, axillary or inguinal regions LUNGS: some decreased air entry in right base 1/3 up posterior chest wall HEART: regular rate & rhythm ABDOMEN:  normoactive bowel sounds , non tender, not distended. No palpable hepatosplenomegaly.  Extremity: no pedal edema PSYCH: alert & oriented x 3 with fluent speech NEURO: no focal motor/sensory deficits   LABORATORY DATA: .  [name] CBC Latest Ref Rng & Units 08/30/2018 07/17/2018 06/19/2018  WBC 4.0 - 10.5 K/uL 5.2 4.1 4.3  Hemoglobin 12.0 - 15.0 g/dL 10.0(L) 10.7(L) 11.5(L)  Hematocrit 36.0 - 46.0 % 31.6(L) 33.1(L) 36.5  Platelets 150 - 400 K/uL 286 285 230    . CMP Latest Ref Rng & Units 08/30/2018 07/17/2018 06/19/2018  Glucose 70 - 99 mg/dL 306(H) 247(H) 249(H)  BUN 6 - 20 mg/dL _0 Creatinine 0.44 - 1.00 mg/dL 0.80 0.92 1.08(H)  Sodium 135 - 145 mmol/L 138 140 139  Potassium 3.5 - 5.1 mmol/L 4.0 4.1 4.3  Chloride 98 - 111 mmol/L 101 103 100  CO2 22 - 32 mmol/L _1 Calcium 8.9 - 10.3 mg/dL 8.9 8.9 8.9  Total Protein 6.5 - 8.1 g/dL 7.3 8.1 7.8  Total Bilirubin 0.3 - 1.2 mg/dL 0.3 0.4 0.3  Alkaline Phos 38 - 126 U/L 98 105 80  AST 15 - 41 U/L  13(L) 14(L) 27  ALT 0 - 44 U/L _2 08/18/17 Bx:   08/16/17 Molecular Pathology:    RADIOGRAPHIC STUDIES:  .No results found.   ASSESSMENT & PLAN:   53 y.o.  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  -Noted to have a small mass in the left adrenal on CT but PET/CT neg for metastatic disease. Patient has been a lifelong nonsmoker -MRI of the brain was negative for any metastatic disease. At diagnosis. -High PDL1 expression (90%) on [facility] One Neg for EGFR, ALK, ROS-1 and BRAF mutations. -Patient completed her planned definitive chemoradiation with Carbo Taxol on 02/13/2016. No prohibitive toxicities other than some grade 1 skin desquamation and some grade 1-2 radiation esophagitis. She has subsequently received 2 out of 2 planned dose of carboplatin + Taxol. -CTA chest 10/29/2016  no evidence of lung cancer progression in the chest. No PE -CT abd/pelvis-10/29/2016  Interval 5.0 x 5.0 x 4.7 cm mass in the right lateral subcutaneous fat at the level of the mid abdomen. This has CT features compatible with a metastasis or primary neoplasm. -CT C/A/P (05/12/2017): Interval development of right axillary, right external iliac and right inguinal adenopathy. Suspicious for nodal metastasis.  2. Decrease in size of right paratracheal adenopathy. 3. Decrease in size and enhancement associated with right lateral body wall lesion. 4. New small nonspecific pulmonary nodule in the left lower lobe measuring 6 mm. 5. Stable appearance of changes secondary to external beam radiation within the right lung  08/03/17 PET which revealed 2.8 cm right axillary lymph  node is markedly hypermetabolic and consistent with metastatic disease. Extensive radiation changes involving the right paramediastinal lung. There is a focus of hypermetabolism in the right lower lobe which is suspicious for residual or recurrent tumor.  11/11/17 CT C/A/P revealed There has been interval increase in number of enlarged of right axillary nodes and increase in size of right inguinal adenopathy compatible with progression of disease. Stable appearance of the right lung with changes of external beam radiation.    03/01/18 CT C/A/P revealed Bulky right subpectoral/axillary adenopathy, markedly progressive from 11/11/2017. Enlarging retrocrural lymph node. Low right paratracheal adenopathy is stable. 2. Interval resection of a right inguinal nodal mass. Stable right external iliac lymph node. 3. Cholelithiasis.  Stable common bile duct dilatation. 4.  Aortic atherosclerosis.  she received maintenance Durvalumab from  05/24/2016 q2weeks, switched to Nivolumab q2weeks on 12/13/16 in the setting of Biopsy proven isolated metastatic disease. Given the tumor is strongly PDL1 positive and patient has only isolated metastatic disease was treated with palliative RT to metastases with significant improvement. Molecular pathology from 515/19 did not reveal obviously targetable mutations with NF1 and TP53 mutations present.  05/24/18 CT Chest revealed Mixed interval change. 2. Moderate dependent right pleural effusion, significantly increased. 3. Right axillary adenopathy is decreased. 4. New left supraclavicular and left posterior mediastinal lymphadenopathy. Increased right retrocrural adenopathy. These findings are worrisome for progression of metastatic nodal disease. 5. New mild patchy nodular consolidation in the peripheral right middle lobe, which could be inflammatory or neoplastic. 6. Similar finely nodular patchy thickening of the peripheral peribronchovascular interstitium and interlobular septa in  the lower lungs bilaterally, nonspecific, lymphangitic tumor not excluded. 7. Nonspecific increased right breast skin thickening, possibly treatment related. Aortic Atherosclerosis.  05/30/18 CT Abdomen revealed Cholelithiasis, without associated inflammatory changes to suggest acute cholecystitis. Moderate right pleural effusion. Please note that the pelvis was not imaged.  Plan:  -Discussed pt labwork today, 08/30/18; HGB lower at 10.0, Glucose high at 306, Albumin dropping at 3.0 -Discussed the 07/21/18 CT Chest which revealed "Progressive left supraclavicular lymphadenopathy. Right axillary, mediastinal and hilar adenopathy is grossly stable, suboptimally evaluated without contrast. These findings remain consistent with metastatic disease. 2. Slight enlargement of moderate-sized right pleural effusion with extension into the minor fissure. This limits evaluation of the previously demonstrated right middle lobe nodular density. This effusion could be malignant. No discretely enlarging pulmonary nodules identified. 3. Stable skin thickening in the right breast. 4. PET CT follow-up may be helpful to assess for hypermetabolism in the lymph nodes and right pleural space." -Radiation-related skin changes with right breast ? -Discussed again my concern that the patient's cancer is progressing, and the option to pursue further treatment. She has not wanted to pursue further treatment and has understood that this could mean further progression and loss of candidacy for chemotherapy in the future. Discussed possibility of low dose chemotherapy to ensure tolerance. -Pt prefers to speak with her family in the next week to consider what she would like to do -Pt will let me know if she becomes more SOB in the interim, and I will set her up for a thoracentesis -Pt was previously seen   to have progression through immunotherapy treatment and have been holding Nivolumab -Advised crowd avoidance and infection prevention  strategies -Pt had 266m clear yellow fluid removed on 05/25/18 thoracentesis -Home O2, and Social work referral -Continue with outpatient PT -Vitamin A&D ointment and Hydorxyzine for vaginal itching -Advised that pt continue follow up with her cardiologist -Follow up with PCP TSelina Cooley FNP for DM management -Will speak with the pt again in 2 weeks   #3 h/o radiation pneumonitis - no new symptoms. Does has some cough with exertion but this has improved.  #4 Improving fatigue (grade 1) - improved fatigue with levothyroxine compliance with near normalization of FT4 levels.  - previously given TSH of 12 -- increased Levothyroxine from 1289m to 15079m ---will likely need larger dose but in the setting of baseline tachycardia we are gradually increasing dose to avoid ppt cardiac arrhythmias.  TSH elevated due to medication non compliance.  #5 normocytic anemia related to chemotherapy -resolving and stable  #6 Left chest wall pain due to costochondritis - mx with pain medications. CTA x 3 neg for PE. No significant pain at this time.  #7 Sinus tachycardia - on BB per cards , no PE on CTA x 3 , -Still having sinus tachycardia . Partly from deconditioning and anemia. Seeing Dr. HilDebara Pickettom cardiology  -Continues to be on BB  #8 neoplasm related pain is currently controlled without significant medications. Clinically likely has sleep apnea . Would need to be careful with sedative medications . 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 The patient knows to call the clinic with any problems,  questions or concerns.  The total time spent in the appt was 30 minutes and more than 50% was on counseling and direct patient cares.   GautaSullivan LoneS AAClarksvilleVMS SCH CAdvent Health Dade CityHMunising Memorial Hospitaltology/Oncology Physician [facility] Arrow[facility] Regional Medical Centerfice):       [phone] cell):  [phone]           [phone] SchuyBaldwin Jamaicaacting as a scribe for Dr. GautaSullivan LoneI have reviewed the above documentation for accuracy and completeness, and I agree with the above. .GautBrunetta Genera

## 2018-08-30 ENCOUNTER — Inpatient Hospital Stay: Payer: Medicare Other

## 2018-08-30 ENCOUNTER — Other Ambulatory Visit: Payer: Self-pay

## 2018-08-30 ENCOUNTER — Inpatient Hospital Stay: Payer: Medicare Other | Attending: Hematology

## 2018-08-30 ENCOUNTER — Inpatient Hospital Stay (HOSPITAL_BASED_OUTPATIENT_CLINIC_OR_DEPARTMENT_OTHER): Payer: Medicare Other | Admitting: Hematology

## 2018-08-30 VITALS — BP 146/87 | HR 127 | Temp 98.5°F | Resp 18 | Ht 69.0 in | Wt 268.9 lb

## 2018-08-30 DIAGNOSIS — R000 Tachycardia, unspecified: Secondary | ICD-10-CM | POA: Insufficient documentation

## 2018-08-30 DIAGNOSIS — E7800 Pure hypercholesterolemia, unspecified: Secondary | ICD-10-CM | POA: Diagnosis not present

## 2018-08-30 DIAGNOSIS — I10 Essential (primary) hypertension: Secondary | ICD-10-CM | POA: Insufficient documentation

## 2018-08-30 DIAGNOSIS — C3491 Malignant neoplasm of unspecified part of right bronchus or lung: Secondary | ICD-10-CM

## 2018-08-30 DIAGNOSIS — Z452 Encounter for adjustment and management of vascular access device: Secondary | ICD-10-CM

## 2018-08-30 DIAGNOSIS — J45909 Unspecified asthma, uncomplicated: Secondary | ICD-10-CM

## 2018-08-30 DIAGNOSIS — M129 Arthropathy, unspecified: Secondary | ICD-10-CM | POA: Insufficient documentation

## 2018-08-30 DIAGNOSIS — G629 Polyneuropathy, unspecified: Secondary | ICD-10-CM | POA: Insufficient documentation

## 2018-08-30 DIAGNOSIS — E039 Hypothyroidism, unspecified: Secondary | ICD-10-CM

## 2018-08-30 DIAGNOSIS — E1165 Type 2 diabetes mellitus with hyperglycemia: Secondary | ICD-10-CM

## 2018-08-30 DIAGNOSIS — G893 Neoplasm related pain (acute) (chronic): Secondary | ICD-10-CM | POA: Insufficient documentation

## 2018-08-30 DIAGNOSIS — C792 Secondary malignant neoplasm of skin: Secondary | ICD-10-CM | POA: Insufficient documentation

## 2018-08-30 DIAGNOSIS — R5383 Other fatigue: Secondary | ICD-10-CM

## 2018-08-30 DIAGNOSIS — Z7189 Other specified counseling: Secondary | ICD-10-CM

## 2018-08-30 DIAGNOSIS — D6481 Anemia due to antineoplastic chemotherapy: Secondary | ICD-10-CM

## 2018-08-30 DIAGNOSIS — Z95828 Presence of other vascular implants and grafts: Secondary | ICD-10-CM

## 2018-08-30 LAB — CMP (CANCER CENTER ONLY)
ALT: 10 U/L (ref 0–44)
AST: 13 U/L — ABNORMAL LOW (ref 15–41)
Albumin: 3 g/dL — ABNORMAL LOW (ref 3.5–5.0)
Alkaline Phosphatase: 98 U/L (ref 38–126)
Anion gap: 9 (ref 5–15)
BUN: 7 mg/dL (ref 6–20)
CO2: 28 mmol/L (ref 22–32)
Calcium: 8.9 mg/dL (ref 8.9–10.3)
Chloride: 101 mmol/L (ref 98–111)
Creatinine: 0.8 mg/dL (ref 0.44–1.00)
GFR, Est AFR Am: >60 mL/min (ref >60)
GFR, Estimated: >60 mL/min (ref >60)
Glucose, Bld: 306 mg/dL — ABNORMAL HIGH (ref 70–99)
Potassium: 4 mmol/L (ref 3.5–5.1)
Sodium: 138 mmol/L (ref 135–145)
Total Bilirubin: 0.3 mg/dL (ref 0.3–1.2)
Total Protein: 7.3 g/dL (ref 6.5–8.1)

## 2018-08-30 LAB — CBC WITH DIFFERENTIAL/PLATELET
Abs Immature Granulocytes: 0.02 10*3/uL (ref 0.00–0.07)
Basophils Absolute: 0 10*3/uL (ref 0.0–0.1)
Basophils Relative: 1 %
Eosinophils Absolute: 0.3 10*3/uL (ref 0.0–0.5)
Eosinophils Relative: 5 %
HCT: 31.6 % — ABNORMAL LOW (ref 36.0–46.0)
Hemoglobin: 10 g/dL — ABNORMAL LOW (ref 12.0–15.0)
Immature Granulocytes: 0 %
Lymphocytes Relative: 17 %
Lymphs Abs: 0.9 10*3/uL (ref 0.7–4.0)
MCH: 30.1 pg (ref 26.0–34.0)
MCHC: 31.6 g/dL (ref 30.0–36.0)
MCV: 95.2 fL (ref 80.0–100.0)
Monocytes Absolute: 0.3 10*3/uL (ref 0.1–1.0)
Monocytes Relative: 6 %
Neutro Abs: 3.7 10*3/uL (ref 1.7–7.7)
Neutrophils Relative %: 71 %
Platelets: 286 10*3/uL (ref 150–400)
RBC: 3.32 MIL/uL — ABNORMAL LOW (ref 3.87–5.11)
RDW: 12.8 % (ref 11.5–15.5)
WBC: 5.2 10*3/uL (ref 4.0–10.5)
nRBC: 0 % (ref 0.0–0.2)

## 2018-08-30 MED ORDER — HEPARIN SOD (PORK) LOCK FLUSH 100 UNIT/ML IV SOLN
500.0000 [IU] | Freq: Once | INTRAVENOUS | Status: AC | PRN
  Administered 2018-08-30: 500 [IU] via INTRAVENOUS
  Filled 2018-08-30: qty 5

## 2018-08-30 MED ORDER — SODIUM CHLORIDE 0.9% FLUSH
10.0000 mL | INTRAVENOUS | Status: DC | PRN
  Administered 2018-08-30: 11:00:00 10 mL via INTRAVENOUS
  Filled 2018-08-30: qty 10

## 2018-08-31 ENCOUNTER — Telehealth: Payer: Self-pay | Admitting: Rehabilitation

## 2018-08-31 ENCOUNTER — Telehealth: Payer: Self-pay | Admitting: Hematology

## 2018-08-31 NOTE — Telephone Encounter (Signed)
Called pt to check in.  She is still trying not to leave the house due to [place].  Has not heard anything back about her compression garments so PT emailed [employer] to see where they are in the process.  If they are not covered will need to talk to patient about ordering a less expensive option per pt request.

## 2018-08-31 NOTE — Telephone Encounter (Signed)
Scheduled appt per 5/27 los.  Patient aware of appt date and time.  Patient is also aware that the appt is a phone visit only.

## 2018-09-12 ENCOUNTER — Telehealth: Payer: Self-pay | Admitting: Hematology

## 2018-09-12 NOTE — Telephone Encounter (Signed)
Left msg to verify pt telephone visit for pre reg

## 2018-09-12 NOTE — Progress Notes (Signed)
[name]    HEMATOLOGY/ONCOLOGY CLINIC NOTE  Date of Service: 09/13/18     Patient Care Team: [name], FNP as PCP - General (Nurse Practitioner) Pixie Casino, MD as PCP - Cardiology (Cardiology)  CHIEF COMPLAINTS  F/u for continued management of metastatic lung adenocarcinoma  Diagnosis Metastatic Lung Adenocarcinoma with rt flank subcutaneous metastasis.  Current treatment:  Nivolumab q2 weeks Palliative RT to pain rt inguinal mass   PreviousTreatment -Concurrent Chemo-radiation with Carboplatin/Taxol. Completed RT on 02/13/2016 and has then completed 2 cycles of carboplatin + taxol. -Started Maintenance Durvalumab from  05/24/2016 q2weeks, switched to Nivolumab q2weeks on 12/13/16 once metastatic disease noted -s/p palliative RT to rt sided Subcutaneous metastatic mass.    HISTORY OF PRESENTING ILLNESS: Plz see my previous note for details on initial presentation.  INTERVAL HISTORY:   I connected with Marilyn [name] on 09/13/18 at  3:00 PM EDT by telephone and verified that I am speaking with the correct person using two identifiers.  I discussed the limitations, risks, security and privacy concerns of performing an evaluation and management service by telemedicine and the availability of in-person appointments. I also discussed with the patient that there may be a patient responsible charge related to this service. The patient expressed understanding and agreed to proceed.  Other persons participating in the visit and their role in the encounter: daughter and aide.  Patient's location: her home Provider's location: my office at the [facility] is called today for followup of her metastatic lung cancer. The patient's last visit with [place] was on 08/30/18. The pt reports that she is doing well overall.  The pt reports that she has become more frequently SOB in the interim. She also endorses slowly increasing pain in right chest, and  swelling under right armpit which she relates to her known lymphedema. She notes that she and her family have considered treatment options moving forward, and the pt notes that she would like to begin second line chemotherapy.  On review of systems, pt reports right armpit swelling, worsening SOB, right chest pain, and denies concerns for infections, and any other symptoms.   MEDICAL HISTORY:  Past Medical History:  Diagnosis Date   Arthritis    Asthma    Diabetes mellitus    Type II   Hemorrhoids    Hypercholesteremia    Hypertension    Hypothyroidism    Metastatic lung cancer (metastasis from lung to other site) (HCC)    Lung, Mets to skin- right flank. 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 mild - EF 45-50% by echo 10/2016 but EF 52% by low risk nuc 11/2016. b. Echo 07/2017 - EF normal, mild AI, unremarkable echo otherwise.   Obese    Pneumonia 2017   Sinus tachycardia    a. dates back to at least 2013, etiology unclear.    SURGICAL HISTORY: Past Surgical History:  Procedure Laterality Date   CESAREAN SECTION     COLONOSCOPY     I/D  arm Right 2013   IR FLUORO GUIDE PORT INSERTION RIGHT  02/18/2017   IR GENERIC HISTORICAL  01/08/2016   IR US GUIDE VASC ACCESS RIGHT 01/08/2016 [name], DO [facility]   IR GENERIC HISTORICAL  01/08/2016   IR FLUORO GUIDE CV LINE RIGHT 01/08/2016 [name], DO  [facility]   IR THORACENTESIS ASP PLEURAL SPACE W/IMG GUIDE  05/25/2018   IR US GUIDE VASC ACCESS RIGHT  02/18/2017   MULTIPLE EXTRACTIONS WITH ALVEOLOPLASTY N/A 03/25/2017   Procedure: MULTIPLE EXTRACTION WITH ALVEOLOPLASTY (teeth #s four, six, seven, eight, nine, ten, eleven, one, twenty-three, twenty-four, twenty-five, twenty-six, twenty-nine, thirty);  Surgeon: [name], [name];  Location: [facility];  Service: Oral  Surgery;  Laterality: N/A;   TUBAL LIGATION     US guided core needle biopsy     of right lower neck/supraclavicular lymph nodes    SOCIAL HISTORY: Social History   Socioeconomic History   Marital status: Divorced    Spouse name: Not on file   Number of children: Not on file   Years of education: Not on file   Highest education level: Not on file  Occupational History   Not on file  Social Needs   Financial resource strain: Not on file   Food insecurity:    Worry: Not on file    Inability: Not on file   Transportation needs:    Medical: No    Non-medical: No  Tobacco Use   Smoking status: Never Smoker   Smokeless tobacco: Never Used  Substance and Sexual Activity   Alcohol use: No   Drug use: No   Sexual activity: Not Currently  Lifestyle   Physical activity:    Days per week: Not on file    Minutes per session: Not on file   Stress: Not on file  Relationships   Social connections:    Talks on phone: Not on file    Gets together: Not on file    Attends religious service: Not on file    Active member of club or organization: Not on file    Attends meetings of clubs or organizations: Not on file    Relationship status: Not on file   Intimate partner violence:    Fear of current or ex partner: No    Emotionally abused: No    Physically abused: No    Forced sexual activity: No  Other Topics Concern   Not on file  Social History Narrative   No bird or mold exposure. No recent travel.   11-23-17 Unable to ask abuse questions mother with her today.    FAMILY HISTORY: Family History  Problem Relation Age of Onset   Heart failure Mother    Hypertension Mother    Cancer Neg Hx    Rheumatologic disease Neg Hx     ALLERGIES:  has No Known Allergies.  MEDICATIONS:  Current Outpatient Medications  Medication Sig Dispense Refill   ACCU-CHEK AVIVA PLUS test strip CHECK BLOOD SUGAR THREE TIMES A DAY BEFORE MEALS DIRECTED      amoxicillin-clavulanate (AUGMENTIN) 875-125 MG tablet Take 1 tablet by mouth 2 (two) times daily. (Patient not taking: Reported on 08/10/2018) 14 tablet 0   ASPIRIN LOW DOSE 81 MG EC tablet TAKE 1 TABLET BY MOUTH EVERY DAY (Patient taking differently: Take 81 mg by mouth daily. Enteric Coated.  ** DO NOT CRUSH **) 60 tablet 2   atorvastatin (LIPITOR) 40 MG tablet Take 1 tablet (40 mg total) by mouth daily at 6 PM. 30 tablet 0   DULoxetine (CYMBALTA) 60 MG capsule TAKE 1 CAPSULE BY MOUTH EVERY DAY (Patient taking differently: Take 60 mg by mouth daily. ) 30 capsule 2   fluconazole (DIFLUCAN) 200 MG tablet Take 1 tablet (200 mg total) by mouth daily. 1 tablet 0  hydrOXYzine (ATARAX/VISTARIL) 10 MG tablet Take 1 tablet (10 mg total) by mouth 3 (three) times daily as needed for itching. 30 tablet 0   insulin aspart (NOVOLOG) 100 UNIT/ML FlexPen As per sliding scale instructions (Patient taking differently: Inject 0-10 Units into the skin 3 (three) times daily. As per sliding scale instructions) 15 mL 2   Insulin Degludec (TRESIBA FLEXTOUCH) 200 UNIT/ML SOPN Inject 100 Units into the skin daily before breakfast.      levalbuterol (XOPENEX HFA) 45 MCG/ACT inhaler Inhale 1-2 puffs into the lungs every 8 (eight) hours as needed for wheezing. 1 Inhaler 12   levothyroxine (SYNTHROID, LEVOTHROID) 150 MCG tablet TAKE 1 TABLET BY MOUTH EVERY DAY BEFORE BREAKFAST (Patient taking differently: Take 150 mcg by mouth daily before breakfast. ) 30 tablet 2   lidocaine-prilocaine (EMLA) cream Apply 1 application topically as needed. 30 g 6   metoprolol succinate (TOPROL-XL) 50 MG 24 hr tablet Take 1 tablet (6m) by mouth in the morning and 1/2 tablet (239m in the evening. Take with or immediately following a meal. 135 tablet 3   naproxen (NAPROSYN) 500 MG tablet Take 1 tablet (500 mg total) by mouth 2 (two) times daily with a meal. (Patient not taking: Reported on 08/10/2018) 20 tablet 0   nitrofurantoin,  macrocrystal-monohydrate, (MACROBID) 100 MG capsule Take 1 capsule (100 mg total) by mouth every 12 (twelve) hours. (Patient not taking: Reported on 08/10/2018) 8 capsule 0   omeprazole (PRILOSEC) 20 MG capsule Take by mouth daily.     oxyCODONE-acetaminophen (PERCOCET) 5-325 MG tablet Take 1 tablet by mouth every 6 (six) hours as needed for moderate pain or severe pain. 50 tablet 0   pregabalin (LYRICA) 50 MG capsule Take 50 mg by mouth 3 (three) times daily.     senna-docusate (SENNA S) 8.6-50 MG tablet Take 2 tablets by mouth 2 (two) times daily. May reduce to 2 tab po HS as needed once regular BM estabished (Patient taking differently: Take 2 tablets by mouth 2 (two) times daily as needed (FOR CONSTIPATION). =) 60 tablet 1   traZODone (DESYREL) 50 MG tablet Take 1-2 tablets (50-100 mg total) by mouth at bedtime as needed for sleep. (Patient not taking: Reported on 08/10/2018) 60 tablet 0   Vitamins A & D (VITAMIN A & D) OINT Apply 1 application topically 2 (two) times daily. 1 Tube 3   No current facility-administered medications for this visit.    Facility-Administered Medications Ordered in Other Visits  Medication Dose Route Frequency Provider Last Rate Last Dose   sodium chloride 0.9 % injection 10 mL  10 mL Intravenous PRN KaBrunetta GeneraMD   10 mL at 05/24/16 1252    REVIEW OF SYSTEMS:    A 10+ POINT REVIEW OF SYSTEMS WAS OBTAINED including neurology, dermatology, psychiatry, cardiac, respiratory, lymph, extremities, GI, GU, Musculoskeletal, constitutional, breasts, reproductive, HEENT.  All pertinent positives are noted in the HPI.  All others are negative.   PHYSICAL EXAMINATION: ECOG PERFORMANCE STATUS: 1 - Symptomatic but completely ambulatory  There were no vitals filed for this visit. There were no vitals filed for this visit. .There is no height or weight on file to calculate BMI.  Phone visit  LABORATORY DATA: .  . [name] KitchenBC Latest Ref Rng & Units 08/30/2018  07/17/2018 06/19/2018  WBC 4.0 - 10.5 K/uL 5.2 4.1 4.3  Hemoglobin 12.0 - 15.0 g/dL 10.0(L) 10.7(L) 11.5(L)  Hematocrit 36.0 - 46.0 % 31.6(L) 33.1(L) 36.5  Platelets 150 - 400 K/uL 286  285 230    . CMP Latest Ref Rng & Units 08/30/2018 07/17/2018 06/19/2018  Glucose 70 - 99 mg/dL 306(H) 247(H) 249(H)  BUN 6 - 20 mg/dL '7 9 9  ' Creatinine 0.44 - 1.00 mg/dL 0.80 0.92 1.08(H)  Sodium 135 - 145 mmol/L 138 140 139  Potassium 3.5 - 5.1 mmol/L 4.0 4.1 4.3  Chloride 98 - 111 mmol/L 101 103 100  CO2 22 - 32 mmol/L '28 26 27  ' Calcium 8.9 - 10.3 mg/dL 8.9 8.9 8.9  Total Protein 6.5 - 8.1 g/dL 7.3 8.1 7.8  Total Bilirubin 0.3 - 1.2 mg/dL 0.3 0.4 0.3  Alkaline Phos 38 - 126 U/L 98 105 80  AST 15 - 41 U/L 13(L) 14(L) 27  ALT 0 - 44 U/L '10 7 15               ' 08/18/17 Bx:   08/16/17 Molecular Pathology:    RADIOGRAPHIC STUDIES:  .No results found.   ASSESSMENT & PLAN:   53 y.o.  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  -Noted to have a small mass in the left adrenal on CT but PET/CT neg for metastatic disease. Patient has been a lifelong nonsmoker -MRI of the brain was negative for any metastatic disease. At diagnosis. -High PDL1 expression (90%) on foundation One Neg for EGFR, ALK, ROS-1 and BRAF mutations. -Patient completed her planned definitive chemoradiation with Carbo Taxol on 02/13/2016. No prohibitive toxicities other than some grade 1 skin desquamation and some grade 1-2 radiation esophagitis. She has subsequently received 2 out of 2 planned dose of carboplatin + Taxol. -CTA chest 10/29/2016  no evidence of lung cancer progression in the chest. No  PE -CT abd/pelvis-10/29/2016  Interval 5.0 x 5.0 x 4.7 cm mass in the right lateral subcutaneous fat at the level of the mid abdomen. This has CT features compatible with a metastasis or primary neoplasm. -CT C/A/P (05/12/2017): Interval development of right axillary, right external iliac and right inguinal adenopathy. Suspicious for nodal metastasis. 2. Decrease in size of right paratracheal adenopathy. 3. Decrease in size and enhancement associated with right lateral body wall lesion. 4. New small nonspecific pulmonary nodule in the left lower lobe measuring 6 mm. 5. Stable appearance of changes secondary to external beam radiation within the right lung  08/03/17 PET which revealed 2.8 cm right axillary lymph node is markedly hypermetabolic and consistent with metastatic disease. Extensive radiation changes involving the right paramediastinal lung. There is a focus of hypermetabolism in the right lower lobe which is suspicious for residual or recurrent tumor.  11/11/17 CT C/A/P revealed There has been interval increase in number of enlarged of right axillary nodes and increase in size of right inguinal adenopathy compatible with progression of disease. Stable appearance of the right lung with changes of external beam radiation.    03/01/18 CT C/A/P revealed Bulky right subpectoral/axillary adenopathy, markedly progressive from 11/11/2017. Enlarging retrocrural lymph node. Low right paratracheal adenopathy is stable. 2. Interval resection of a right inguinal nodal mass. Stable right external iliac lymph node. 3. Cholelithiasis.  Stable common bile duct dilatation. 4.  Aortic atherosclerosis.  she received maintenance Durvalumab from  05/24/2016 q2weeks, switched to Nivolumab q2weeks on 12/13/16 in the  setting of Biopsy proven isolated metastatic disease. Given the tumor is strongly PDL1 positive and patient has only isolated metastatic disease was treated with palliative RT to metastases with significant  improvement. Molecular pathology from 515/19 did not reveal obviously targetable mutations with NF1 and TP53 mutations present.  05/24/18 CT Chest revealed Mixed interval change. 2. Moderate dependent right pleural effusion, significantly increased. 3. Right axillary adenopathy is decreased. 4. New left supraclavicular and left posterior mediastinal lymphadenopathy. Increased right retrocrural adenopathy. These findings are worrisome for progression of metastatic nodal disease. 5. New mild patchy nodular consolidation in the peripheral right middle lobe, which could be inflammatory or neoplastic. 6. Similar finely nodular patchy thickening of the peripheral peribronchovascular interstitium and interlobular septa in the lower lungs bilaterally, nonspecific, lymphangitic tumor not excluded. 7. Nonspecific increased right breast skin thickening, possibly treatment related. Aortic Atherosclerosis.  05/30/18 CT Abdomen revealed Cholelithiasis, without associated inflammatory changes to suggest acute cholecystitis. Moderate right pleural effusion. Please note that the pelvis was not imaged.  07/21/18 CT Chest revealed "Progressive left supraclavicular lymphadenopathy. Right axillary, mediastinal and hilar adenopathy is grossly stable, suboptimally evaluated without contrast. These findings remain consistent with metastatic disease. 2. Slight enlargement of moderate-sized right pleural effusion with extension into the minor fissure. This limits evaluation of the previously demonstrated right middle lobe nodular density. This effusion could be malignant. No discretely enlarging pulmonary nodules identified. 3. Stable skin thickening in the right breast. 4. PET CT follow-up may be helpful to assess for hypermetabolism in the lymph nodes and right pleural space."  Plan:  -Recommend beginning C1 Carboplatin and Alimta and then adding Avastin with future cycles after initial tolerance is displayed. Recommend pursuing up  to 4 cycles with G-CSF support given previous ctx exposure. Would then tentatively move to maintenance Alimta and Avastin. The pt agrees with this plan. -Will plan to begin C1 Carboplatin Alimta in the next 1-2 weeks -Will order CT Chest and will also refer to IR for thoracentesis -Radiation-related skin changes with right breast ? -Pt was previously seen to have progression through immunotherapy treatment and have been holding Nivolumab -Advised crowd avoidance and infection prevention strategies -Pt had 261m clear yellow fluid removed on 05/25/18 thoracentesis -Home O2, and Social work referral -Continue with outpatient PT -Vitamin A&D ointment and Hydorxyzine for vaginal itching -Advised that pt continue follow up with her cardiologist -Follow up with PCP TSelina Cooley FNP for DM management -Will see the pt back in 1 week   #3 h/o radiation pneumonitis - no new symptoms. Does has some cough with exertion but this has improved.  #4 Improving fatigue (grade 1) - improved fatigue with levothyroxine compliance with near normalization of FT4 levels.  - previously given TSH of 12 -- increased Levothyroxine from 1277m to 15018m ---will likely need larger dose but in the setting of baseline tachycardia we are gradually increasing dose to avoid ppt cardiac arrhythmias.  TSH elevated due to medication non compliance.  #5 normocytic anemia related to chemotherapy -resolving and stable  #6 Left chest wall pain due to costochondritis - mx with pain medications. CTA x 3 neg for PE. No significant pain at this time.  #7 Sinus tachycardia - on BB per cards , no PE on CTA x 3 , -Still having sinus tachycardia . Partly from deconditioning and anemia. Seeing Dr. HilDebara Pickettom cardiology  -Continues to be on BB  #8 neoplasm related pain is currently controlled without significant medications. Clinically likely has sleep apnea . Would need  to be careful with sedative medications . 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 The patient was provided an opportunity to ask questions and all were answered. The patient agreed with the plan and demonstrated an understanding of the instructions.   The patient was advised to call back or seek an in-person evaluation if the symptoms worsen or if the condition fails to improve as anticipated.  The total time spent in the appt was 25 minutes and more than 50% was on counseling and direct patient cares.   GaSullivan LoneD MSJobosAHIVMS SCMemorial Hermann Southeast HospitalTEphraim Mcdowell James B. Haggin Memorial Hospitalematology/Oncology Physician [facility](Office):       [phone] cell):  [phone]):           [phone], ScBaldwin Jamaicaam acting as a scribe for Dr. GaSullivan Lone  .I have reviewed the above documentation for accuracy and completeness, and I agree with the above. .GBrunetta GeneraD

## 2018-09-13 ENCOUNTER — Inpatient Hospital Stay: Payer: Medicare Other | Attending: Hematology | Admitting: Hematology

## 2018-09-13 DIAGNOSIS — Z5111 Encounter for antineoplastic chemotherapy: Secondary | ICD-10-CM | POA: Insufficient documentation

## 2018-09-13 DIAGNOSIS — T451X5A Adverse effect of antineoplastic and immunosuppressive drugs, initial encounter: Secondary | ICD-10-CM | POA: Diagnosis not present

## 2018-09-13 DIAGNOSIS — R300 Dysuria: Secondary | ICD-10-CM | POA: Diagnosis not present

## 2018-09-13 DIAGNOSIS — C792 Secondary malignant neoplasm of skin: Secondary | ICD-10-CM | POA: Diagnosis not present

## 2018-09-13 DIAGNOSIS — R000 Tachycardia, unspecified: Secondary | ICD-10-CM | POA: Diagnosis not present

## 2018-09-13 DIAGNOSIS — Z794 Long term (current) use of insulin: Secondary | ICD-10-CM | POA: Insufficient documentation

## 2018-09-13 DIAGNOSIS — Z9114 Patient's other noncompliance with medication regimen: Secondary | ICD-10-CM | POA: Insufficient documentation

## 2018-09-13 DIAGNOSIS — J45909 Unspecified asthma, uncomplicated: Secondary | ICD-10-CM | POA: Insufficient documentation

## 2018-09-13 DIAGNOSIS — R946 Abnormal results of thyroid function studies: Secondary | ICD-10-CM | POA: Diagnosis not present

## 2018-09-13 DIAGNOSIS — C3491 Malignant neoplasm of unspecified part of right bronchus or lung: Secondary | ICD-10-CM | POA: Diagnosis not present

## 2018-09-13 DIAGNOSIS — D6481 Anemia due to antineoplastic chemotherapy: Secondary | ICD-10-CM

## 2018-09-13 DIAGNOSIS — I700 Atherosclerosis of aorta: Secondary | ICD-10-CM | POA: Insufficient documentation

## 2018-09-13 DIAGNOSIS — M940 Chondrocostal junction syndrome [Tietze]: Secondary | ICD-10-CM | POA: Insufficient documentation

## 2018-09-13 DIAGNOSIS — G893 Neoplasm related pain (acute) (chronic): Secondary | ICD-10-CM | POA: Diagnosis not present

## 2018-09-13 DIAGNOSIS — M129 Arthropathy, unspecified: Secondary | ICD-10-CM | POA: Insufficient documentation

## 2018-09-13 DIAGNOSIS — K8020 Calculus of gallbladder without cholecystitis without obstruction: Secondary | ICD-10-CM | POA: Insufficient documentation

## 2018-09-13 DIAGNOSIS — R590 Localized enlarged lymph nodes: Secondary | ICD-10-CM | POA: Diagnosis not present

## 2018-09-13 DIAGNOSIS — E1140 Type 2 diabetes mellitus with diabetic neuropathy, unspecified: Secondary | ICD-10-CM | POA: Diagnosis not present

## 2018-09-13 DIAGNOSIS — J90 Pleural effusion, not elsewhere classified: Secondary | ICD-10-CM | POA: Insufficient documentation

## 2018-09-13 DIAGNOSIS — I890 Lymphedema, not elsewhere classified: Secondary | ICD-10-CM | POA: Insufficient documentation

## 2018-09-13 DIAGNOSIS — G4700 Insomnia, unspecified: Secondary | ICD-10-CM | POA: Insufficient documentation

## 2018-09-13 DIAGNOSIS — E039 Hypothyroidism, unspecified: Secondary | ICD-10-CM | POA: Insufficient documentation

## 2018-09-13 DIAGNOSIS — Z79899 Other long term (current) drug therapy: Secondary | ICD-10-CM | POA: Insufficient documentation

## 2018-09-13 DIAGNOSIS — E1165 Type 2 diabetes mellitus with hyperglycemia: Secondary | ICD-10-CM | POA: Insufficient documentation

## 2018-09-13 DIAGNOSIS — E7800 Pure hypercholesterolemia, unspecified: Secondary | ICD-10-CM | POA: Insufficient documentation

## 2018-09-13 DIAGNOSIS — Z7689 Persons encountering health services in other specified circumstances: Secondary | ICD-10-CM | POA: Insufficient documentation

## 2018-09-13 DIAGNOSIS — F17210 Nicotine dependence, cigarettes, uncomplicated: Secondary | ICD-10-CM | POA: Insufficient documentation

## 2018-09-13 DIAGNOSIS — Z923 Personal history of irradiation: Secondary | ICD-10-CM | POA: Insufficient documentation

## 2018-09-13 DIAGNOSIS — I10 Essential (primary) hypertension: Secondary | ICD-10-CM | POA: Diagnosis not present

## 2018-09-13 DIAGNOSIS — R234 Changes in skin texture: Secondary | ICD-10-CM | POA: Insufficient documentation

## 2018-09-14 ENCOUNTER — Telehealth: Payer: Self-pay | Admitting: Hematology

## 2018-09-14 ENCOUNTER — Other Ambulatory Visit: Payer: Self-pay

## 2018-09-14 ENCOUNTER — Other Ambulatory Visit (HOSPITAL_COMMUNITY)
Admission: RE | Admit: 2018-09-14 | Discharge: 2018-09-14 | Disposition: A | Discharge status: Home / Self Care | Payer: Medicare Other | Source: Ambulatory Visit | Attending: Hematology | Admitting: Hematology

## 2018-09-14 ENCOUNTER — Ambulatory Visit (HOSPITAL_COMMUNITY)
Admission: RE | Admit: 2018-09-14 | Discharge: 2018-09-14 | Disposition: A | Discharge status: Home / Self Care | Payer: Medicare Other | Source: Ambulatory Visit | Attending: Hematology | Admitting: Hematology

## 2018-09-14 DIAGNOSIS — R234 Changes in skin texture: Secondary | ICD-10-CM | POA: Insufficient documentation

## 2018-09-14 DIAGNOSIS — C3490 Malignant neoplasm of unspecified part of unspecified bronchus or lung: Secondary | ICD-10-CM | POA: Diagnosis not present

## 2018-09-14 DIAGNOSIS — J90 Pleural effusion, not elsewhere classified: Secondary | ICD-10-CM

## 2018-09-14 DIAGNOSIS — R591 Generalized enlarged lymph nodes: Secondary | ICD-10-CM | POA: Diagnosis not present

## 2018-09-14 DIAGNOSIS — Z20828 Contact with and (suspected) exposure to other viral communicable diseases: Secondary | ICD-10-CM | POA: Diagnosis not present

## 2018-09-14 NOTE — Telephone Encounter (Signed)
Scheduled appt per 6/10 los. Spoke with patient and patient aware of appt date and time

## 2018-09-15 ENCOUNTER — Ambulatory Visit (HOSPITAL_COMMUNITY)
Admission: RE | Admit: 2018-09-15 | Discharge: 2018-09-15 | Disposition: A | Discharge status: Home / Self Care | Payer: Medicare Other | Source: Ambulatory Visit | Attending: Hematology | Admitting: Hematology

## 2018-09-15 ENCOUNTER — Ambulatory Visit (HOSPITAL_COMMUNITY)
Admission: RE | Admit: 2018-09-15 | Discharge: 2018-09-15 | Disposition: A | Discharge status: Home / Self Care | Payer: Medicare Other | Source: Ambulatory Visit | Attending: Radiology | Admitting: Radiology

## 2018-09-15 ENCOUNTER — Telehealth: Payer: Self-pay | Admitting: *Deleted

## 2018-09-15 DIAGNOSIS — J90 Pleural effusion, not elsewhere classified: Secondary | ICD-10-CM

## 2018-09-15 DIAGNOSIS — C782 Secondary malignant neoplasm of pleura: Secondary | ICD-10-CM | POA: Diagnosis not present

## 2018-09-15 DIAGNOSIS — J918 Pleural effusion in other conditions classified elsewhere: Secondary | ICD-10-CM | POA: Diagnosis not present

## 2018-09-15 DIAGNOSIS — Z9889 Other specified postprocedural states: Secondary | ICD-10-CM | POA: Diagnosis not present

## 2018-09-15 DIAGNOSIS — C801 Malignant (primary) neoplasm, unspecified: Secondary | ICD-10-CM | POA: Diagnosis not present

## 2018-09-15 LAB — PROTEIN, PLEURAL OR PERITONEAL FLUID: Total protein, fluid: 4.8 g/dL

## 2018-09-15 LAB — BODY FLUID CELL COUNT WITH DIFFERENTIAL
Lymphs, Fluid: 41 %
Monocyte-Macrophage-Serous Fluid: 53 % (ref 50–90)
Neutrophil Count, Fluid: 6 % (ref 0–25)
Total Nucleated Cell Count, Fluid: 3110 cu mm — ABNORMAL HIGH (ref 0–1000)

## 2018-09-15 LAB — NOVEL CORONAVIRUS, NAA (HOSP ORDER, SEND-OUT TO REF LAB; TAT 18-24 HRS): SARS-CoV-2, NAA: NOT DETECTED

## 2018-09-15 LAB — GRAM STAIN

## 2018-09-15 LAB — LACTATE DEHYDROGENASE, PLEURAL OR PERITONEAL FLUID: LD, Fluid: 312 U/L — ABNORMAL HIGH (ref 3–23)

## 2018-09-15 MED ORDER — LIDOCAINE HCL 1 % IJ SOLN
INTRAMUSCULAR | Status: AC
  Filled 2018-09-15: qty 10

## 2018-09-15 NOTE — Telephone Encounter (Signed)
Contacted by Lennette Bihari w/[facility]. Patient scheduled for Thoracentesis this morning at 11AM. Negative result on COVID 19 test required for this procedure. Patient had COVID test yesterday afternoon and results will not be available for approximately 48 hours. They cannot perform thoracentesis at scheduled time today.  Information given to Dr. Irene Limbo. He asked to have thoracentesis rescheduled for Monday and to advise patient to go to ED over weekend if se develops shortness of breath with or without CP or dizziness.  Patient contacted and given information regarding change in appointment to Monday and Dr. Grier Mitts directions. She verbalized understanding.  Radiology is to contact this office with new appt time for Monday. Advised patient that she will be contacted later today with new appt time. She verbalized understanding.

## 2018-09-15 NOTE — Procedures (Signed)
Ultrasound-guided diagnostic and therapeutic right thoracentesis performed yielding 1 liter of hazy, amber colored fluid. No immediate complications. Follow-up chest x-ray pending. The fluid was sent to the lab for preordered studies. EBL none.

## 2018-09-18 NOTE — Progress Notes (Signed)
OFF PATHWAY REGIMEN - Non-Small Cell Lung  No Change  Continue With Treatment as Ordered.   [id number]:Durvalumab 10 mg/kg q14 Days:   A cycle is every 14 days:     Durvalumab        Dose Mod: None  **Always confirm dose/schedule in your pharmacy ordering system**  Patient Characteristics: Stage III - Unresectable, PS = 0, 1 AJCC T Category: TX Current Disease Status: No Distant Mets or Local Recurrence AJCC N Category: NX AJCC M Category: M0 AJCC 8 Stage Grouping: IIIB Performance Status: PS = 0, 1 Intent of Therapy: Non-Curative / Palliative Intent, Discussed with Patient

## 2018-09-18 NOTE — Progress Notes (Signed)
DISCONTINUE OFF PATHWAY REGIMEN - Non-Small Cell Lung   [id number]:Durvalumab 10 mg/kg q14 Days:   A cycle is every 14 days:     Durvalumab        Dose Mod: None  **Always confirm dose/schedule in your pharmacy ordering system**  REASON: Disease Progression PRIOR TREATMENT: Off Pathway: Durvalumab 10 mg/kg q14 Days TREATMENT RESPONSE: Partial Response (PR)  START OFF PATHWAY REGIMEN - Non-Small Cell Lung   [id number]:Carboplatin AUC=5 + Pemetrexed 500 mg/m2 + Bevacizumab 15 mg/kg q21 Days:   A cycle is every 21 days:     Carboplatin      Pemetrexed      Bevacizumab-xxxx   **Always confirm dose/schedule in your pharmacy ordering system**  Patient Characteristics: Stage IV Metastatic, Nonsquamous, Initial Chemotherapy/Immunotherapy, PS = 0, 1, ALK or EGFR or ROS1 or NTRK Genomic Alterations - Did Not Order Test/Quantity Not Sufficient AJCC T Category: TX Current Disease Status: Distant Metastases AJCC N Category: NX AJCC M Category: M1c AJCC 8 Stage Grouping: IVB Histology: Nonsquamous Cell ROS1 Rearrangement Status: Negative T790M Mutation Status: Not Applicable - EGFR Mutation Negative/Unknown Other Mutations/Biomarkers: No Other Actionable Mutations NTRK Gene Fusion Status: Did Not Order Test PD-L1 Expression Status: PD-L1 Positive ? 50% (TPS) Chemotherapy/Immunotherapy LOT: Initial Chemotherapy/Immunotherapy Molecular Targeted Therapy: Not Appropriate ALK Rearrangement Status: Negative EGFR Mutation Status: Negative/Wild Type BRAF V600E Mutation Status: Negative ECOG Performance Status: 1 Intent of Therapy: Non-Curative / Palliative Intent, Discussed with Patient

## 2018-09-19 NOTE — Progress Notes (Signed)
Marilyn [name] [name] Kitchen    HEMATOLOGY/ONCOLOGY CLINIC NOTE  Date of Service: 09/20/18     Patient Care Team: [name], FNP as PCP - General (Nurse Practitioner) Pixie Casino, MD as PCP - Cardiology (Cardiology)  CHIEF COMPLAINTS  F/u for continued management of metastatic lung adenocarcinoma  Diagnosis Metastatic Lung Adenocarcinoma with rt flank subcutaneous metastasis.  Current treatment:  Nivolumab q2 weeks Palliative RT to pain rt inguinal mass   PreviousTreatment -Concurrent Chemo-radiation with Carboplatin/Taxol. Completed RT on 02/13/2016 and has then completed 2 cycles of carboplatin + taxol. -Started Maintenance Durvalumab from  05/24/2016 q2weeks, switched to Nivolumab q2weeks on 12/13/16 once metastatic disease noted -s/p palliative RT to rt sided Subcutaneous metastatic mass.    HISTORY OF PRESENTING ILLNESS: Plz see my previous note for details on initial presentation.  INTERVAL HISTORY:  Marilyn [name] [name] returns today for followup of her metastatic lung cancer. The patient's last visit with [place] was on 09/13/18. The pt reports that she is doing well overall.  The pt reports that she has continued to have some SOB after her 09/15/18 thoracentesis which was able to remove a 1 liter of fluid. The cytology revealed malignant pleural effusion.   The pt notes that she has continued on 100 units Insulin once a day, and 10 units with each meal, and takes a sliding scale of 3-4 additional units per 67m glucose above 2026m The pt notes that she has had some itching spots on her abdominal and chest skin, and has been scratching these frequently, but this has responded to topical Cortisone. The pt notes that she has had moments in which her fingers seem to "spasm," when attempting to pick something up. She endorses muscle cramping as well.  The pt notes that she has skipped taking her thyroid replacement in the last week, but will resume this.  Of note since the patient's last visit,  pt has had a CT Chest completed on 09/14/18 with results revealing "Persistent right axillary, mediastinal, and left supraclavicular adenopathy. Stable to mild increase in size of these lymph nodes period. 2. Stable to mild decrease in size of moderate size right pleural effusion. 3. Stable skin thickening of the right breast."  Lab results today (09/20/18) of CBC w/diff and CMP is as follows: all values are WNL except for RBC at 3.48, HGB at 10.6, HCT at 32.2, Chloride at 97, Glucose at 353, Albumin at 3.2.  On review of systems, pt reports some SOB, stable energy levels, itching, muscle cramps, and denies concerns for infections, and any other symptoms.   MEDICAL HISTORY:  Past Medical History:  Diagnosis Date  . Arthritis   . Asthma   . Diabetes mellitus    Type II  . Hemorrhoids   . Hypercholesteremia   . Hypertension   . Hypothyroidism   . Metastatic lung cancer (metastasis from lung to other site) (HCC)    Lung, Mets to skin- right flank. CT angio 2019 suspicious for axillary, external iliac, right inguinal mets  . 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 [place]   a. mild - EF 45-50% by echo 10/2016 but EF 52% by low risk nuc 11/2016. b. Echo 07/2017 - EF normal, mild AI, unremarkable echo otherwise.  . Obese   . Pneumonia 2017  . Sinus tachycardia    a. dates back to at least 2013, etiology unclear.    SURGICAL HISTORY: Past Surgical History:  Procedure Laterality Date  .  CESAREAN SECTION    . COLONOSCOPY    . I/D  arm Right 2013  . IR FLUORO GUIDE PORT INSERTION RIGHT  02/18/2017  . IR GENERIC HISTORICAL  01/08/2016   IR US GUIDE VASC ACCESS RIGHT 01/08/2016 [name], DO [facility]  . IR GENERIC HISTORICAL  01/08/2016   IR FLUORO GUIDE CV LINE RIGHT 01/08/2016 [name], DO [facility]  . IR THORACENTESIS ASP PLEURAL SPACE W/IMG GUIDE  05/25/2018  . IR US GUIDE VASC ACCESS RIGHT  02/18/2017  . MULTIPLE EXTRACTIONS  WITH ALVEOLOPLASTY N/A 03/25/2017   Procedure: MULTIPLE EXTRACTION WITH ALVEOLOPLASTY (teeth #s four, six, seven, eight, nine, ten, eleven, one, twenty-three, twenty-four, twenty-five, twenty-six, twenty-nine, thirty);  Surgeon: [name], [name];  Location: [facility];  Service: Oral Surgery;  Laterality: N/A;  . TUBAL LIGATION    . US guided core needle biopsy     of right lower neck/supraclavicular lymph nodes    SOCIAL HISTORY: Social History   Socioeconomic History  . Marital status: Divorced    Spouse name: Not on file  . Number of children: Not on file  . Years of education: Not on file  . Highest education level: Not on file  Occupational History  . Not on file  Social Needs  . Financial resource strain: Not on file  . Food insecurity    Worry: Not on file    Inability: Not on file  . Transportation needs    Medical: No    Non-medical: No  Tobacco Use  . Smoking status: Never Smoker  . Smokeless tobacco: Never Used  Substance and Sexual Activity  . Alcohol use: No  . Drug use: No  . Sexual activity: Not Currently  Lifestyle  . Physical activity    Days per week: Not on file    Minutes per session: Not on file  . Stress: Not on file  Relationships  . Social Herbalist on phone: Not on file    Gets together: Not on file    Attends religious service: Not on file    Active member of club or organization: Not on file    Attends meetings of clubs or organizations: Not on file    Relationship status: Not on file  . Intimate partner violence    Fear of current or ex partner: No    Emotionally abused: No    Physically abused: No    Forced sexual activity: No  Other Topics Concern  . Not on file  Social History Narrative   No bird or mold exposure. No recent travel.   11-23-17 Unable to ask abuse questions mother with her today.    FAMILY HISTORY: Family History  Problem Relation Age of Onset  . Heart failure Mother   . Hypertension Mother   . Cancer  Neg Hx   . Rheumatologic disease Neg Hx     ALLERGIES:  has No Known Allergies.  MEDICATIONS:  Current Outpatient Medications  Medication Sig Dispense Refill  . ACCU-CHEK AVIVA PLUS test strip CHECK BLOOD SUGAR THREE TIMES A DAY BEFORE MEALS DIRECTED    . amoxicillin-clavulanate (AUGMENTIN) 875-125 MG tablet Take 1 tablet by mouth 2 (two) times daily. (Patient not taking: Reported on 08/10/2018) 14 tablet 0  . ASPIRIN LOW DOSE 81 MG EC tablet TAKE 1 TABLET BY MOUTH EVERY DAY (Patient taking differently: Take 81 mg by mouth daily. Enteric Coated.  ** DO NOT CRUSH **) 60 tablet 2  . atorvastatin (LIPITOR)  40 MG tablet Take 1 tablet (40 mg total) by mouth daily at 6 PM. 30 tablet 0  . clotrimazole-betamethasone (LOTRISONE) cream Apply 1 application topically 2 (two) times daily. To areas of skin rash 30 g 0  . dexamethasone (DECADRON) 4 MG tablet Take 1 tab two times a day the day before Alimta chemo. Take 2 tabs two times a day starting the day after chemo for 3 days. 30 tablet 1  . DULoxetine (CYMBALTA) 60 MG capsule TAKE 1 CAPSULE BY MOUTH EVERY DAY (Patient taking differently: Take 60 mg by mouth daily. ) 30 capsule 2  . fluconazole (DIFLUCAN) 200 MG tablet Take 1 tablet (200 mg total) by mouth daily. 1 tablet 0  . folic acid (FOLVITE) 1 MG tablet Take 1 tablet (1 mg total) by mouth daily. Start 5-7 days before Alimta chemotherapy. Continue until 21 days after Alimta completed. 100 tablet 3  . hydrOXYzine (ATARAX/VISTARIL) 10 MG tablet Take 1 tablet (10 mg total) by mouth 3 (three) times daily as needed for itching. 30 tablet 0  . insulin aspart (NOVOLOG) 100 UNIT/ML FlexPen As per sliding scale instructions (Patient taking differently: Inject 0-10 Units into the skin 3 (three) times daily. As per sliding scale instructions) 15 mL 2  . Insulin Degludec (TRESIBA FLEXTOUCH) 200 UNIT/ML SOPN Inject 100 Units into the skin daily before breakfast.     . levalbuterol (XOPENEX HFA) 45 MCG/ACT inhaler  Inhale 1-2 puffs into the lungs every 8 (eight) hours as needed for wheezing. 1 Inhaler 12  . levothyroxine (SYNTHROID, LEVOTHROID) 150 MCG tablet TAKE 1 TABLET BY MOUTH EVERY DAY BEFORE BREAKFAST (Patient taking differently: Take 150 mcg by mouth daily before breakfast. ) 30 tablet 2  . lidocaine-prilocaine (EMLA) cream Apply 1 application topically as needed. 30 g 6  . lidocaine-prilocaine (EMLA) cream Apply to affected area once 30 g 3  . lidocaine-prilocaine (EMLA) cream Apply to affected area once 30 g 3  . LORazepam (ATIVAN) 0.5 MG tablet Take 1 tablet (0.5 mg total) by mouth every 6 (six) hours as needed (Nausea or vomiting). 30 tablet 0  . metoprolol succinate (TOPROL-XL) 50 MG 24 hr tablet Take 1 tablet (36m) by mouth in the morning and 1/2 tablet (240m in the evening. Take with or immediately following a meal. 135 tablet 3  . naproxen (NAPROSYN) 500 MG tablet Take 1 tablet (500 mg total) by mouth 2 (two) times daily with a meal. (Patient not taking: Reported on 08/10/2018) 20 tablet 0  . nitrofurantoin, macrocrystal-monohydrate, (MACROBID) 100 MG capsule Take 1 capsule (100 mg total) by mouth every 12 (twelve) hours. (Patient not taking: Reported on 08/10/2018) 8 capsule 0  . omeprazole (PRILOSEC) 20 MG capsule Take by mouth daily.    . ondansetron (ZOFRAN) 8 MG tablet Take 1 tablet (8 mg total) by mouth 2 (two) times daily as needed for refractory nausea / vomiting. Start on day 3 after chemo. 30 tablet 1  . oxyCODONE-acetaminophen (PERCOCET) 5-325 MG tablet Take 1 tablet by mouth every 6 (six) hours as needed for moderate pain or severe pain. 50 tablet 0  . pregabalin (LYRICA) 50 MG capsule Take 50 mg by mouth 3 (three) times daily.    . prochlorperazine (COMPAZINE) 10 MG tablet Take 1 tablet (10 mg total) by mouth every 6 (six) hours as needed (Nausea or vomiting). 30 tablet 1  . senna-docusate (SENNA S) 8.6-50 MG tablet Take 2 tablets by mouth 2 (two) times daily. May reduce to 2 tab po  HS  as needed once regular BM estabished (Patient taking differently: Take 2 tablets by mouth 2 (two) times daily as needed (FOR CONSTIPATION). =) 60 tablet 1  . traZODone (DESYREL) 50 MG tablet Take 1-2 tablets (50-100 mg total) by mouth at bedtime as needed for sleep. (Patient not taking: Reported on 08/10/2018) 60 tablet 0  . Vitamins A & D (VITAMIN A & D) OINT Apply 1 application topically 2 (two) times daily. 1 Tube 3   No current facility-administered medications for this visit.    Facility-Administered Medications Ordered in Other Visits  Medication Dose Route Frequency Provider Last Rate Last Dose  . sodium chloride 0.9 % injection 10 mL  10 mL Intravenous PRN [name], MD   10 mL at 05/24/16 1252    REVIEW OF SYSTEMS:    A 10+ POINT REVIEW OF SYSTEMS WAS OBTAINED including neurology, dermatology, psychiatry, cardiac, respiratory, lymph, extremities, GI, GU, Musculoskeletal, constitutional, breasts, reproductive, HEENT.  All pertinent positives are noted in the HPI.  All others are negative.   PHYSICAL EXAMINATION: ECOG PERFORMANCE STATUS: 1 - Symptomatic but completely ambulatory  Vitals:   09/20/18 1136  BP: (!) 157/96  Pulse: (!) 125  Resp: 18  Temp: 98.2 F (36.8 C)  SpO2: 99%   Filed Weights   09/20/18 1136  Weight: 259 lb 8 oz (117.7 kg)   .Body mass index is 38.32 kg/m.  GENERAL:alert, in no acute distress and comfortable SKIN: eczematoid abdominal rashes EYES: conjunctiva are pink and non-injected, sclera anicteric OROPHARYNX: MMM, no exudates, no oropharyngeal erythema or ulceration NECK: supple, no JVD LYMPH:  no palpable lymphadenopathy in the cervical, axillary or inguinal regions LUNGS: fluid build up 1/4 posterior right chest wall HEART: regular rate & rhythm ABDOMEN:  normoactive bowel sounds , non tender, not distended. No palpable hepatosplenomegaly.  Extremity: no pedal edema PSYCH: alert & oriented x 3 with fluent speech NEURO: no focal  motor/sensory deficits   LABORATORY DATA: .  Marilyn [name] [name] Kitchen CBC Latest Ref Rng & Units 09/20/2018 08/30/2018 07/17/2018  WBC 4.0 - 10.5 K/uL 6.3 5.2 4.1  Hemoglobin 12.0 - 15.0 g/dL 10.6(L) 10.0(L) 10.7(L)  Hematocrit 36.0 - 46.0 % 32.2(L) 31.6(L) 33.1(L)  Platelets 150 - 400 K/uL 261 286 285    . CMP Latest Ref Rng & Units 09/20/2018 08/30/2018 07/17/2018  Glucose 70 - 99 mg/dL 353(H) 306(H) 247(H)  BUN 6 - 20 mg/dL _0 Creatinine 0.44 - 1.00 mg/dL 0.88 0.80 0.92  Sodium 135 - 145 mmol/L 135 138 140  Potassium 3.5 - 5.1 mmol/L 4.3 4.0 4.1  Chloride 98 - 111 mmol/L 97(L) 101 103  CO2 22 - 32 mmol/L _1 Calcium 8.9 - 10.3 mg/dL 8.9 8.9 8.9  Total Protein 6.5 - 8.1 g/dL 7.9 7.3 8.1  Total Bilirubin 0.3 - 1.2 mg/dL 0.3 0.3 0.4  Alkaline Phos 38 - 126 U/L 106 98 105  AST 15 - 41 U/L 15 13(L) 14(L)  ALT 0 - 44 U/L _2 09/15/18 Pleural Fluid Cytology:             08/18/17 Bx:   08/16/17 Molecular Pathology:    RADIOGRAPHIC STUDIES:  .Dg Chest 1 View  Result Date: 09/15/2018 CLINICAL DATA:  S/P right thoracentesis that yielded1 liter of fluid. EXAM: CHEST  1 VIEW COMPARISON:  Chest CT 611 2020 FINDINGS: Reduction in volume of lower RIGHT pleural effusion. No pneumothorax. RIGHT perihilar density noted. LEFT lung clear IMPRESSION:  No pneumothorax following thoracentesis.  Reduction in pleural fluid Electronically Signed   By: Suzy Bouchard M.D.   On: 09/15/2018 12:47   Ct Chest Wo Contrast  Result Date: 09/14/2018 CLINICAL DATA:  Follow-up lung cancer. EXAM: CT CHEST WITHOUT CONTRAST TECHNIQUE: Multidetector CT imaging of the chest was performed following the standard protocol without IV contrast. COMPARISON:  07/21/2018 FINDINGS: Cardiovascular: Heart size appears normal. No pericardial effusion. Mild aortic atherosclerosis. Mediastinum/Nodes: Normal appearance of the thyroid gland. The trachea appears patent and is midline. Normal appearance of the esophagus. Index  right axillary lymph node measures 2.4 cm, image 48/2. Previously 2.2 cm. Index right axillary lymph node measures 1.7 cm, image 38/2. Previously 1.5 cm. The index left supraclavicular lymph node measures 1.8 cm, image 11/2. Unchanged. The index right paratracheal lymph node measures 1.8 cm, image 54/2. Previously 1.6 cm. Lungs/Pleura: Moderate volume right pleural effusion is identified, stable to slightly decreased in size from previous exam. Paramediastinal radiation change within a geographic distribution in the right upper lobe with masslike architectural distortion in the right hilum is similar to previous study. No suspicious pulmonary nodules. Upper Abdomen: No acute abnormality. Multiples large stones within the gallbladder. Musculoskeletal: Right breast skin thickening is again noted. No suspicious bone lesion. IMPRESSION: 1. Persistent right axillary, mediastinal, and left supraclavicular adenopathy. Stable to mild increase in size of these lymph nodes period. 2. Stable to mild decrease in size of moderate size right pleural effusion. 3. Stable skin thickening of the right breast. Electronically Signed   By: Kerby Moors M.D.   On: 09/14/2018 14:48   US Thoracentesis Asp Pleural Space W/img Guide  Result Date: 09/15/2018 INDICATION: Patient with history of metastatic lung cancer, recurrent right pleural effusion, dyspnea. Request made for diagnostic and therapeutic right thoracentesis. EXAM: ULTRASOUND GUIDED DIAGNOSTIC AND THERAPEUTIC RIGHT THORACENTESIS MEDICATIONS: None COMPLICATIONS: None immediate. PROCEDURE: An ultrasound guided thoracentesis was thoroughly discussed with the patient and questions answered. The benefits, risks, alternatives and complications were also discussed. The patient understands and wishes to proceed with the procedure. Written consent was obtained. Ultrasound was performed to localize and mark an adequate pocket of fluid in the right chest. The area was then prepped  and draped in the normal sterile fashion. 1% Lidocaine was used for local anesthesia. Under ultrasound guidance a 6 Fr Safe-T-Centesis catheter was introduced. Thoracentesis was performed. The catheter was removed and a dressing applied. FINDINGS: A total of approximately 1 liter of hazy, amber fluid was removed. Samples were sent to the laboratory as requested by the clinical team. IMPRESSION: Successful ultrasound guided diagnostic and therapeutic right thoracentesis yielding 1 liter of pleural fluid. Read by: Rowe Robert, PA-C Electronically Signed   By: Aletta Edouard M.D.   On: 09/15/2018 13:09     ASSESSMENT & PLAN:   53 y.o.  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  -Noted to have a small mass in the left adrenal on CT but PET/CT neg for metastatic disease. Patient has been a lifelong nonsmoker -MRI of the brain was negative for any metastatic disease. At diagnosis. -High PDL1 expression (90%) on [facility] One Neg for EGFR, ALK, ROS-1 and BRAF mutations. -Patient completed her planned definitive chemoradiation with Carbo Taxol  on 02/13/2016. No prohibitive toxicities other than some grade 1 skin desquamation and some grade 1-2 radiation esophagitis. She has subsequently received 2 out of 2 planned dose of carboplatin + Taxol. -CTA chest 10/29/2016  no evidence of lung cancer progression in the chest. No PE -CT abd/pelvis-10/29/2016  Interval 5.0 x 5.0 x 4.7 cm mass in the right lateral subcutaneous fat at the level of the mid abdomen. This has CT features compatible with a metastasis or primary neoplasm. -CT C/A/P (05/12/2017): Interval development of right axillary,  right external iliac and right inguinal adenopathy. Suspicious for nodal metastasis. 2. Decrease in size of right paratracheal adenopathy. 3. Decrease in size and enhancement associated with right lateral body wall lesion. 4. New small nonspecific pulmonary nodule in the left lower lobe measuring 6 mm. 5. Stable appearance of changes secondary to external beam radiation within the right lung  08/03/17 PET which revealed 2.8 cm right axillary lymph node is markedly hypermetabolic and consistent with metastatic disease. Extensive radiation changes involving the right paramediastinal lung. There is a focus of hypermetabolism in the right lower lobe which is suspicious for residual or recurrent tumor.  11/11/17 CT C/A/P revealed There has been interval increase in number of enlarged of right axillary nodes and increase in size of right inguinal adenopathy compatible with progression of disease. Stable appearance of the right lung with changes of external beam radiation.    03/01/18 CT C/A/P revealed Bulky right subpectoral/axillary adenopathy, markedly progressive from 11/11/2017. Enlarging retrocrural lymph node. Low right paratracheal adenopathy is stable. 2. Interval resection of a right inguinal nodal mass. Stable right external iliac lymph node. 3. Cholelithiasis.  Stable common bile duct dilatation. 4.  Aortic atherosclerosis.  she received maintenance Durvalumab from  05/24/2016 q2weeks, switched to Nivolumab q2weeks on 12/13/16 in the setting of Biopsy proven isolated metastatic disease. Given the tumor is strongly PDL1 positive and patient has only isolated metastatic disease was treated with palliative RT to metastases with significant improvement. Molecular pathology from 515/19 did not reveal obviously targetable mutations with NF1 and TP53 mutations present.  05/24/18 CT Chest revealed Mixed interval change. 2. Moderate dependent right pleural effusion, significantly increased. 3. Right axillary  adenopathy is decreased. 4. New left supraclavicular and left posterior mediastinal lymphadenopathy. Increased right retrocrural adenopathy. These findings are worrisome for progression of metastatic nodal disease. 5. New mild patchy nodular consolidation in the peripheral right middle lobe, which could be inflammatory or neoplastic. 6. Similar finely nodular patchy thickening of the peripheral peribronchovascular interstitium and interlobular septa in the lower lungs bilaterally, nonspecific, lymphangitic tumor not excluded. 7. Nonspecific increased right breast skin thickening, possibly treatment related. Aortic Atherosclerosis.  05/30/18 CT Abdomen revealed Cholelithiasis, without associated inflammatory changes to suggest acute cholecystitis. Moderate right pleural effusion. Please note that the pelvis was not imaged.  07/21/18 CT Chest revealed "Progressive left supraclavicular lymphadenopathy. Right axillary, mediastinal and hilar adenopathy is grossly stable, suboptimally evaluated without contrast. These findings remain consistent with metastatic disease. 2. Slight enlargement of moderate-sized right pleural effusion with extension into the minor fissure. This limits evaluation of the previously demonstrated right middle lobe nodular density. This effusion could be malignant. No discretely enlarging pulmonary nodules identified. 3. Stable skin thickening in the right breast. 4. PET CT follow-up may be helpful to assess for hypermetabolism in the lymph nodes and right pleural space."  Pt was previously seen to have progression through immunotherapy treatment and have been holding Nivolumab  Plan:  -Discussed pt labwork today, 09/20/18; blood  counts are stable. Glucose at 353. -Strongly recommend greater attention to controlling her DM with sliding scale recommendations -Discussed the 09/14/18 CT Chest which revealed "Persistent right axillary, mediastinal, and left supraclavicular adenopathy. Stable to  mild increase in size of these lymph nodes period. 2. Stable to mild decrease in size of moderate size right pleural effusion. 3. Stable skin thickening of the right breast." -S/p 09/15/18 Thoracentesis removed 1L of fluid -Discussed the 09/15/18 Cytology which revealed malignant cells in the removed pleural fluid -The pt has no prohibitive toxicities from beginning C1 Carboplatin and Alimta with G-CSF support at this time. Will aim to add Avastin with C2 if C1 tolerance is displayed. -Advised that pt let me know if she develops worsened SOB for consideration of repeating thoracentesis -Begin PO Folic acid and recommend salt and baking soda mouthwashes 4-5 times a day for 7 days following chemotherapy infusions -Beginning [place] Vitamin B12 every third cycle -Ordering Lotrisone and recommended not scratching skin with fingernails, but with palms if she has to scratch -Will repeat scans after C4 -Plan C1 Carboplatin and Alimta and then adding Avastin with future cycles after initial tolerance is displayed. Recommend pursuing up to 4 cycles with G-CSF support given previous ctx exposure. Would then tentatively move to maintenance Alimta and Avastin. -Radiation-related skin changes with right breast ? -Advised crowd avoidance and infection prevention strategies -Continue with outpatient PT -Vitamin A&D ointment and Hydorxyzine for vaginal itching -Advised that pt continue follow up with her cardiologist -Follow up with PCP Selina Cooley, FNP for DM management -Will see the pt back in 10-12 days for toxicity check   #3 h/o radiation pneumonitis - no new symptoms. Does has some cough with exertion but this has improved.  #4 Improving fatigue (grade 1) - improved fatigue with levothyroxine compliance with near normalization of FT4 levels.  - previously given TSH of 12 -- increased Levothyroxine from 124mg to 1547m  ---will likely need larger dose but in the setting of baseline tachycardia we are  gradually increasing dose to avoid ppt cardiac arrhythmias.  TSH elevated due to medication non compliance.  #5 normocytic anemia related to chemotherapy -resolving and stable  #6 Left chest wall pain due to costochondritis - mx with pain medications. CTA x 3 neg for PE. No significant pain at this time.  #7 Sinus tachycardia - on BB per cards , no PE on CTA x 3 , -Still having sinus tachycardia . Partly from deconditioning and anemia. Seeing Dr. HiDebara Pickettrom cardiology  -Continues to be on BB  #8 neoplasm related pain is currently controlled without significant medications. Clinically likely has sleep apnea . Would need to be careful with sedative medications . 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 The patient was provided an opportunity to ask questions and all were answered. The patient agreed with the plan and demonstrated an understanding of the instructions.   The patient was advised to call back or seek an in-person evaluation if the symptoms worsen  or if the condition fails to improve as anticipated.  The total time spent in the appt was 25 minutes and more than 50% was on counseling and direct patient cares.   Sullivan Lone MD [place] AAHIVMS [facility] Hematology/Oncology Physician [facility]  (Office):       [phone] (Work cell):  [phone] (Fax):           [phone]  I, Baldwin Jamaica, am acting as a scribe for Dr. Sullivan Lone.   .I have reviewed  the above documentation for accuracy and completeness, and I agree with the above. [name] MD

## 2018-09-20 ENCOUNTER — Inpatient Hospital Stay (HOSPITAL_BASED_OUTPATIENT_CLINIC_OR_DEPARTMENT_OTHER): Payer: Medicare Other | Admitting: Hematology

## 2018-09-20 ENCOUNTER — Other Ambulatory Visit: Payer: Self-pay

## 2018-09-20 ENCOUNTER — Inpatient Hospital Stay: Payer: Medicare Other

## 2018-09-20 VITALS — BP 157/96 | HR 125 | Temp 98.2°F | Resp 18 | Ht 69.0 in | Wt 259.5 lb

## 2018-09-20 VITALS — HR 115

## 2018-09-20 DIAGNOSIS — C3491 Malignant neoplasm of unspecified part of right bronchus or lung: Secondary | ICD-10-CM | POA: Diagnosis not present

## 2018-09-20 DIAGNOSIS — I890 Lymphedema, not elsewhere classified: Secondary | ICD-10-CM | POA: Diagnosis not present

## 2018-09-20 DIAGNOSIS — G893 Neoplasm related pain (acute) (chronic): Secondary | ICD-10-CM

## 2018-09-20 DIAGNOSIS — D6481 Anemia due to antineoplastic chemotherapy: Secondary | ICD-10-CM

## 2018-09-20 DIAGNOSIS — E1365 Other specified diabetes mellitus with hyperglycemia: Secondary | ICD-10-CM

## 2018-09-20 DIAGNOSIS — M940 Chondrocostal junction syndrome [Tietze]: Secondary | ICD-10-CM

## 2018-09-20 DIAGNOSIS — Z7689 Persons encountering health services in other specified circumstances: Secondary | ICD-10-CM

## 2018-09-20 DIAGNOSIS — Z452 Encounter for adjustment and management of vascular access device: Secondary | ICD-10-CM

## 2018-09-20 DIAGNOSIS — C3490 Malignant neoplasm of unspecified part of unspecified bronchus or lung: Secondary | ICD-10-CM

## 2018-09-20 DIAGNOSIS — Z7189 Other specified counseling: Secondary | ICD-10-CM

## 2018-09-20 DIAGNOSIS — Z5111 Encounter for antineoplastic chemotherapy: Secondary | ICD-10-CM

## 2018-09-20 DIAGNOSIS — K8020 Calculus of gallbladder without cholecystitis without obstruction: Secondary | ICD-10-CM

## 2018-09-20 DIAGNOSIS — T451X5A Adverse effect of antineoplastic and immunosuppressive drugs, initial encounter: Secondary | ICD-10-CM

## 2018-09-20 DIAGNOSIS — C792 Secondary malignant neoplasm of skin: Secondary | ICD-10-CM | POA: Diagnosis not present

## 2018-09-20 DIAGNOSIS — Z79899 Other long term (current) drug therapy: Secondary | ICD-10-CM

## 2018-09-20 DIAGNOSIS — R000 Tachycardia, unspecified: Secondary | ICD-10-CM

## 2018-09-20 DIAGNOSIS — R234 Changes in skin texture: Secondary | ICD-10-CM

## 2018-09-20 DIAGNOSIS — Z9114 Patient's other noncompliance with medication regimen: Secondary | ICD-10-CM

## 2018-09-20 DIAGNOSIS — IMO0002 Reserved for concepts with insufficient information to code with codable children: Secondary | ICD-10-CM

## 2018-09-20 DIAGNOSIS — Z794 Long term (current) use of insulin: Secondary | ICD-10-CM

## 2018-09-20 DIAGNOSIS — R590 Localized enlarged lymph nodes: Secondary | ICD-10-CM

## 2018-09-20 DIAGNOSIS — Z923 Personal history of irradiation: Secondary | ICD-10-CM

## 2018-09-20 DIAGNOSIS — F17210 Nicotine dependence, cigarettes, uncomplicated: Secondary | ICD-10-CM

## 2018-09-20 DIAGNOSIS — J910 Malignant pleural effusion: Secondary | ICD-10-CM

## 2018-09-20 DIAGNOSIS — R300 Dysuria: Secondary | ICD-10-CM

## 2018-09-20 DIAGNOSIS — J90 Pleural effusion, not elsewhere classified: Secondary | ICD-10-CM

## 2018-09-20 DIAGNOSIS — M129 Arthropathy, unspecified: Secondary | ICD-10-CM

## 2018-09-20 DIAGNOSIS — G4700 Insomnia, unspecified: Secondary | ICD-10-CM | POA: Diagnosis not present

## 2018-09-20 DIAGNOSIS — Z95828 Presence of other vascular implants and grafts: Secondary | ICD-10-CM

## 2018-09-20 DIAGNOSIS — E7800 Pure hypercholesterolemia, unspecified: Secondary | ICD-10-CM

## 2018-09-20 DIAGNOSIS — I10 Essential (primary) hypertension: Secondary | ICD-10-CM

## 2018-09-20 DIAGNOSIS — R21 Rash and other nonspecific skin eruption: Secondary | ICD-10-CM

## 2018-09-20 DIAGNOSIS — R739 Hyperglycemia, unspecified: Secondary | ICD-10-CM

## 2018-09-20 DIAGNOSIS — J45909 Unspecified asthma, uncomplicated: Secondary | ICD-10-CM

## 2018-09-20 DIAGNOSIS — R946 Abnormal results of thyroid function studies: Secondary | ICD-10-CM

## 2018-09-20 DIAGNOSIS — E1140 Type 2 diabetes mellitus with diabetic neuropathy, unspecified: Secondary | ICD-10-CM

## 2018-09-20 DIAGNOSIS — I700 Atherosclerosis of aorta: Secondary | ICD-10-CM

## 2018-09-20 DIAGNOSIS — E1165 Type 2 diabetes mellitus with hyperglycemia: Secondary | ICD-10-CM

## 2018-09-20 DIAGNOSIS — E039 Hypothyroidism, unspecified: Secondary | ICD-10-CM

## 2018-09-20 LAB — CMP (CANCER CENTER ONLY)
ALT: 10 U/L (ref 0–44)
AST: 15 U/L (ref 15–41)
Albumin: 3.2 g/dL — ABNORMAL LOW (ref 3.5–5.0)
Alkaline Phosphatase: 106 U/L (ref 38–126)
Anion gap: 13 (ref 5–15)
BUN: 10 mg/dL (ref 6–20)
CO2: 25 mmol/L (ref 22–32)
Calcium: 8.9 mg/dL (ref 8.9–10.3)
Chloride: 97 mmol/L — ABNORMAL LOW (ref 98–111)
Creatinine: 0.88 mg/dL (ref 0.44–1.00)
GFR, Est AFR Am: >60 mL/min (ref >60)
GFR, Estimated: >60 mL/min (ref >60)
Glucose, Bld: 353 mg/dL — ABNORMAL HIGH (ref 70–99)
Potassium: 4.3 mmol/L (ref 3.5–5.1)
Sodium: 135 mmol/L (ref 135–145)
Total Bilirubin: 0.3 mg/dL (ref 0.3–1.2)
Total Protein: 7.9 g/dL (ref 6.5–8.1)

## 2018-09-20 LAB — CULTURE, BODY FLUID W GRAM STAIN -BOTTLE: Culture: NO GROWTH

## 2018-09-20 LAB — CBC WITH DIFFERENTIAL/PLATELET
Abs Immature Granulocytes: 0.02 10*3/uL (ref 0.00–0.07)
Basophils Absolute: 0 10*3/uL (ref 0.0–0.1)
Basophils Relative: 0 %
Eosinophils Absolute: 0.3 10*3/uL (ref 0.0–0.5)
Eosinophils Relative: 4 %
HCT: 32.2 % — ABNORMAL LOW (ref 36.0–46.0)
Hemoglobin: 10.6 g/dL — ABNORMAL LOW (ref 12.0–15.0)
Immature Granulocytes: 0 %
Lymphocytes Relative: 14 %
Lymphs Abs: 0.9 10*3/uL (ref 0.7–4.0)
MCH: 30.5 pg (ref 26.0–34.0)
MCHC: 32.9 g/dL (ref 30.0–36.0)
MCV: 92.5 fL (ref 80.0–100.0)
Monocytes Absolute: 0.4 10*3/uL (ref 0.1–1.0)
Monocytes Relative: 7 %
Neutro Abs: 4.8 10*3/uL (ref 1.7–7.7)
Neutrophils Relative %: 75 %
Platelets: 261 10*3/uL (ref 150–400)
RBC: 3.48 MIL/uL — ABNORMAL LOW (ref 3.87–5.11)
RDW: 12.2 % (ref 11.5–15.5)
WBC: 6.3 10*3/uL (ref 4.0–10.5)
nRBC: 0 % (ref 0.0–0.2)

## 2018-09-20 MED ORDER — CYANOCOBALAMIN 1000 MCG/ML IJ SOLN
1000.0000 ug | Freq: Once | INTRAMUSCULAR | Status: AC
  Administered 2018-09-20: 1000 ug via SUBCUTANEOUS

## 2018-09-20 MED ORDER — PALONOSETRON HCL INJECTION 0.25 MG/5ML
INTRAVENOUS | Status: AC
  Filled 2018-09-20: qty 5

## 2018-09-20 MED ORDER — DEXAMETHASONE 4 MG PO TABS: ORAL_TABLET | ORAL | 1 refills | Status: AC

## 2018-09-20 MED ORDER — FOLIC ACID 1 MG PO TABS: 1.0000 mg | ORAL_TABLET | Freq: Every day | ORAL | 3 refills | Status: AC

## 2018-09-20 MED ORDER — PROCHLORPERAZINE MALEATE 10 MG PO TABS: 10.0000 mg | ORAL_TABLET | Freq: Four times a day (QID) | ORAL | 1 refills | Status: DC | PRN

## 2018-09-20 MED ORDER — SODIUM CHLORIDE 0.9% FLUSH
10.0000 mL | INTRAVENOUS | Status: DC | PRN
  Administered 2018-09-20: 10 mL
  Filled 2018-09-20: qty 10

## 2018-09-20 MED ORDER — PALONOSETRON HCL INJECTION 0.25 MG/5ML
0.2500 mg | Freq: Once | INTRAVENOUS | Status: AC
  Administered 2018-09-20: 0.25 mg via INTRAVENOUS

## 2018-09-20 MED ORDER — DIPHENHYDRAMINE HCL 50 MG/ML IJ SOLN
25.0000 mg | Freq: Once | INTRAMUSCULAR | Status: AC
  Administered 2018-09-20: 25 mg via INTRAVENOUS

## 2018-09-20 MED ORDER — LORAZEPAM 0.5 MG PO TABS: 0.5000 mg | ORAL_TABLET | Freq: Four times a day (QID) | ORAL | 0 refills | Status: DC | PRN

## 2018-09-20 MED ORDER — SODIUM CHLORIDE 0.9% FLUSH
10.0000 mL | INTRAVENOUS | Status: DC | PRN
  Administered 2018-09-20: 11:00:00 10 mL via INTRAVENOUS
  Filled 2018-09-20: qty 10

## 2018-09-20 MED ORDER — CLOTRIMAZOLE-BETAMETHASONE 1-0.05 % EX CREA: 1.0000 "application " | TOPICAL_CREAM | Freq: Two times a day (BID) | CUTANEOUS | 0 refills | Status: DC

## 2018-09-20 MED ORDER — CYANOCOBALAMIN 1000 MCG/ML IJ SOLN
INTRAMUSCULAR | Status: AC
  Filled 2018-09-20: qty 1

## 2018-09-20 MED ORDER — FAMOTIDINE IN NACL 20-0.9 MG/50ML-% IV SOLN
INTRAVENOUS | Status: AC
  Filled 2018-09-20: qty 50

## 2018-09-20 MED ORDER — ONDANSETRON HCL 8 MG PO TABS: 8.0000 mg | ORAL_TABLET | Freq: Two times a day (BID) | ORAL | 1 refills | Status: AC | PRN

## 2018-09-20 MED ORDER — INSULIN REGULAR HUMAN 100 UNIT/ML IJ SOLN
14.0000 [IU] | Freq: Once | INTRAMUSCULAR | Status: AC
  Administered 2018-09-20: 15:00:00 14 [IU] via SUBCUTANEOUS
  Filled 2018-09-20: qty 10

## 2018-09-20 MED ORDER — FAMOTIDINE IN NACL 20-0.9 MG/50ML-% IV SOLN
20.0000 mg | Freq: Once | INTRAVENOUS | Status: AC
  Administered 2018-09-20: 20 mg via INTRAVENOUS

## 2018-09-20 MED ORDER — LIDOCAINE-PRILOCAINE 2.5-2.5 % EX CREA: TOPICAL_CREAM | CUTANEOUS | 3 refills | Status: DC

## 2018-09-20 MED ORDER — SODIUM CHLORIDE 0.9 % IV SOLN
750.0000 mg | Freq: Once | INTRAVENOUS | Status: AC
  Administered 2018-09-20: 17:00:00 750 mg via INTRAVENOUS
  Filled 2018-09-20: qty 75

## 2018-09-20 MED ORDER — FOLIC ACID 1 MG PO TABS
5.0000 mg | ORAL_TABLET | Freq: Once | ORAL | Status: AC
  Administered 2018-09-20: 16:00:00 5 mg via ORAL
  Filled 2018-09-20: qty 5

## 2018-09-20 MED ORDER — HEPARIN SOD (PORK) LOCK FLUSH 100 UNIT/ML IV SOLN
500.0000 [IU] | Freq: Once | INTRAVENOUS | Status: AC | PRN
  Administered 2018-09-20: 18:00:00 500 [IU]
  Filled 2018-09-20: qty 5

## 2018-09-20 MED ORDER — SODIUM CHLORIDE 0.9 % IV SOLN
Freq: Once | INTRAVENOUS | Status: AC
  Administered 2018-09-20: 16:00:00 via INTRAVENOUS
  Filled 2018-09-20: qty 5

## 2018-09-20 MED ORDER — SODIUM CHLORIDE 0.9 % IV SOLN
500.0000 mg/m2 | Freq: Once | INTRAVENOUS | Status: AC
  Administered 2018-09-20: 1200 mg via INTRAVENOUS
  Filled 2018-09-20: qty 40

## 2018-09-20 MED ORDER — DIPHENHYDRAMINE HCL 50 MG/ML IJ SOLN
INTRAMUSCULAR | Status: AC
  Filled 2018-09-20: qty 1

## 2018-09-20 MED ORDER — SODIUM CHLORIDE 0.9 % IV SOLN
Freq: Once | INTRAVENOUS | Status: AC
  Administered 2018-09-20: 14:00:00 via INTRAVENOUS
  Filled 2018-09-20: qty 250

## 2018-09-20 NOTE — Patient Instructions (Signed)
Thank you for choosing [facility] to provide your oncology and hematology care.   Should you have questions after your visit to the [facility] ([facility]), please contact this office at [phone] between 8:30 AM and 4:30 PM. Voicemails left after 4:00 PM may not be returned until the following business day. Calls received after 4:30 PM will be answered by an off-site Nurse Triage Line.    Prescription Refills:  Please have your pharmacy contact us directly for most prescription requests.  Contact the office directly for refills of narcotics (pain medications). Allow 48-72 hours for refills.  Appointments: Please contact the [facility] scheduling department [phone] for questions regarding [facility] appointment scheduling.  Contact the schedulers with any scheduling changes so that your appointment can be rescheduled in a timely manner.   Central Scheduling for [place] [phone] - Call to schedule procedures such as PET scans, CT scans, MRI, Ultrasound, etc.  To afford each patient quality time with our providers, please arrive 30 minutes before your scheduled appointment time.  If you arrive late for your appointment, you may be asked to reschedule.  We strive to give you quality time with our providers, and arriving late affects you and other patients whose appointments are after yours. If you are a no show for multiple scheduled visits, you may be dismissed from the clinic at the providers discretion.     Resources: [facility] Social Workers [phone] for additional information on assistance programs --Anne Cunningham/Abigail Elmore  [place] DSS  [phone]: Information regarding food stamps, Medicaid, and utility assistance [place] [phone]   [employer]'s shared-ride transportation service for eligible riders who have a disability that prevents them from riding the fixed route bus.   Medicare Rights Center 800-333-4114 Helps people with  Medicare understand their rights and benefits, navigate the Medicare system, and secure the quality healthcare they deserve [employer] 800-227-2345 Assists patients locate various types of support and financial assistance Cancer Care: 1-800-813-HOPE (4673) Provides financial assistance, online support groups, medication/co-pay assistance.      

## 2018-09-20 NOTE — Progress Notes (Signed)
Per Dr.Kale: OK to treat today with HR 115

## 2018-09-20 NOTE — Patient Instructions (Signed)
[facility] Discharge Instructions for Patients Receiving Chemotherapy  Today you received the following chemotherapy agents Carboplatin, Alimta.  To help prevent nausea and vomiting after your treatment, we encourage you to take your nausea medication.   If you develop nausea and vomiting that is not controlled by your nausea medication, call the clinic.   BELOW ARE SYMPTOMS THAT SHOULD BE REPORTED IMMEDIATELY:  *FEVER GREATER THAN 100.5 F  *CHILLS WITH OR WITHOUT FEVER  NAUSEA AND VOMITING THAT IS NOT CONTROLLED WITH YOUR NAUSEA MEDICATION  *UNUSUAL SHORTNESS OF BREATH  *UNUSUAL BRUISING OR BLEEDING  TENDERNESS IN MOUTH AND THROAT WITH OR WITHOUT PRESENCE OF ULCERS  *URINARY PROBLEMS  *BOWEL PROBLEMS  UNUSUAL RASH Items with * indicate a potential emergency and should be followed up as soon as possible.  Feel free to call the clinic should you have any questions or concerns. The clinic phone number is [phone].  Please show the [place] at check-in to the Emergency Department and triage nurse.  Pemetrexed injection What is this medicine? PEMETREXED (PEM e TREX ed) is a chemotherapy drug used to treat lung cancers like non-small cell lung cancer and mesothelioma. It may also be used to treat other cancers. This medicine may be used for other purposes; ask your health care provider or pharmacist if you have questions. COMMON BRAND NAME(S): Alimta What should I tell my health care provider before I take this medicine? They need to know if you have any of these conditions: -infection (especially a virus infection such as chickenpox, cold sores, or herpes) -kidney disease -low blood counts, like low white cell, platelet, or red cell counts -lung or breathing disease, like asthma -radiation therapy -an unusual or allergic reaction to pemetrexed, other medicines, foods, dyes, or preservative -pregnant or trying to get  pregnant -breast-feeding How should I use this medicine? This drug is given as an infusion into a vein. It is administered in a hospital or clinic by a specially trained health care professional. Talk to your pediatrician regarding the use of this medicine in children. Special care may be needed. Overdosage: If you think you have taken too much of this medicine contact a poison control center or emergency room at once. NOTE: This medicine is only for you. Do not share this medicine with others. What if I miss a dose? It is important not to miss your dose. Call your doctor or health care professional if you are unable to keep an appointment. What may interact with this medicine? This medicine may interact with the following medications: -Ibuprofen This list may not describe all possible interactions. Give your health care provider a list of all the medicines, herbs, non-prescription drugs, or dietary supplements you use. Also tell them if you smoke, drink alcohol, or use illegal drugs. Some items may interact with your medicine. What should I watch for while using this medicine? Visit your doctor for checks on your progress. This drug may make you feel generally unwell. This is not uncommon, as chemotherapy can affect healthy cells as well as cancer cells. Report any side effects. Continue your course of treatment even though you feel ill unless your doctor tells you to stop. In some cases, you may be given additional medicines to help with side effects. Follow all directions for their use. Call your doctor or health care professional for advice if you get a fever, chills or sore throat, or other symptoms of a cold or flu. Do not treat yourself. This  drug decreases your body's ability to fight infections. Try to avoid being around people who are sick. This medicine may increase your risk to bruise or bleed. Call your doctor or health care professional if you notice any unusual bleeding. Be careful  brushing and flossing your teeth or using a toothpick because you may get an infection or bleed more easily. If you have any dental work done, tell your dentist you are receiving this medicine. Avoid taking products that contain aspirin, acetaminophen, ibuprofen, naproxen, or ketoprofen unless instructed by your doctor. These medicines may hide a fever. Call your doctor or health care professional if you get diarrhea or mouth sores. Do not treat yourself. To protect your kidneys, drink water or other fluids as directed while you are taking this medicine. Do not become pregnant while taking this medicine or for 6 months after stopping it. Women should inform their doctor if they wish to become pregnant or think they might be pregnant. Men should not father a child while taking this medicine and for 3 months after stopping it. This may interfere with the ability to father a child. You should talk to your doctor or health care professional if you are concerned about your fertility. There is a potential for serious side effects to an unborn child. Talk to your health care professional or pharmacist for more information. Do not breast-feed an infant while taking this medicine or for 1 week after stopping it. What side effects may I notice from receiving this medicine? Side effects that you should report to your doctor or health care professional as soon as possible: -allergic reactions like skin rash, itching or hives, swelling of the face, lips, or tongue -breathing problems -redness, blistering, peeling or loosening of the skin, including inside the mouth -signs and symptoms of bleeding such as bloody or black, tarry stools; red or dark-brown urine; spitting up blood or brown material that looks like coffee grounds; red spots on the skin; unusual bruising or bleeding from the eye, gums, or nose -signs and symptoms of infection like fever or chills; cough; sore throat; pain or trouble passing urine -signs  and symptoms of kidney injury like trouble passing urine or change in the amount of urine -signs and symptoms of liver injury like dark yellow or brown urine; general ill feeling or flu-like symptoms; light-colored stools; loss of appetite; nausea; right upper belly pain; unusually weak or tired; yellowing of the eyes or skin Side effects that usually do not require medical attention (report to your doctor or health care professional if they continue or are bothersome): -constipation -mouth sores -nausea, vomiting -unusually weak or tired This list may not describe all possible side effects. Call your doctor for medical advice about side effects. You may report side effects to FDA at 1-800-FDA-1088. Where should I keep my medicine? This drug is given in a hospital or clinic and will not be stored at home. NOTE: This sheet is a summary. It may not cover all possible information. If you have questions about this medicine, talk to your doctor, pharmacist, or health care provider.  2019 Elsevier/Gold Standard (2017-05-11 16:11:33) Carboplatin injection What is this medicine? CARBOPLATIN (KAR boe pla tin) is a chemotherapy drug. It targets fast dividing cells, like cancer cells, and causes these cells to die. This medicine is used to treat ovarian cancer and many other cancers. This medicine may be used for other purposes; ask your health care provider or pharmacist if you have questions. COMMON BRAND NAME(S): Paraplatin  What should I tell my health care provider before I take this medicine? They need to know if you have any of these conditions: -blood disorders -hearing problems -kidney disease -recent or ongoing radiation therapy -an unusual or allergic reaction to carboplatin, cisplatin, other chemotherapy, other medicines, foods, dyes, or preservatives -pregnant or trying to get pregnant -breast-feeding How should I use this medicine? This drug is usually given as an infusion into a vein.  It is administered in a hospital or clinic by a specially trained health care professional. Talk to your pediatrician regarding the use of this medicine in children. Special care may be needed. Overdosage: If you think you have taken too much of this medicine contact a poison control center or emergency room at once. NOTE: This medicine is only for you. Do not share this medicine with others. What if I miss a dose? It is important not to miss a dose. Call your doctor or health care professional if you are unable to keep an appointment. What may interact with this medicine? -medicines for seizures -medicines to increase blood counts like filgrastim, pegfilgrastim, sargramostim -some antibiotics like amikacin, gentamicin, neomycin, streptomycin, tobramycin -vaccines Talk to your doctor or health care professional before taking any of these medicines: -acetaminophen -aspirin -ibuprofen -ketoprofen -naproxen This list may not describe all possible interactions. Give your health care provider a list of all the medicines, herbs, non-prescription drugs, or dietary supplements you use. Also tell them if you smoke, drink alcohol, or use illegal drugs. Some items may interact with your medicine. What should I watch for while using this medicine? Your condition will be monitored carefully while you are receiving this medicine. You will need important blood work done while you are taking this medicine. This drug may make you feel generally unwell. This is not uncommon, as chemotherapy can affect healthy cells as well as cancer cells. Report any side effects. Continue your course of treatment even though you feel ill unless your doctor tells you to stop. In some cases, you may be given additional medicines to help with side effects. Follow all directions for their use. Call your doctor or health care professional for advice if you get a fever, chills or sore throat, or other symptoms of a cold or flu. Do  not treat yourself. This drug decreases your body's ability to fight infections. Try to avoid being around people who are sick. This medicine may increase your risk to bruise or bleed. Call your doctor or health care professional if you notice any unusual bleeding. Be careful brushing and flossing your teeth or using a toothpick because you may get an infection or bleed more easily. If you have any dental work done, tell your dentist you are receiving this medicine. Avoid taking products that contain aspirin, acetaminophen, ibuprofen, naproxen, or ketoprofen unless instructed by your doctor. These medicines may hide a fever. Do not become pregnant while taking this medicine. Women should inform their doctor if they wish to become pregnant or think they might be pregnant. There is a potential for serious side effects to an unborn child. Talk to your health care professional or pharmacist for more information. Do not breast-feed an infant while taking this medicine. What side effects may I notice from receiving this medicine? Side effects that you should report to your doctor or health care professional as soon as possible: -allergic reactions like skin rash, itching or hives, swelling of the face, lips, or tongue -signs of infection - fever or chills, cough,  sore throat, pain or difficulty passing urine -signs of decreased platelets or bleeding - bruising, pinpoint red spots on the skin, black, tarry stools, nosebleeds -signs of decreased red blood cells - unusually weak or tired, fainting spells, lightheadedness -breathing problems -changes in hearing -changes in vision -chest pain -high blood pressure -low blood counts - This drug may decrease the number of white blood cells, red blood cells and platelets. You may be at increased risk for infections and bleeding. -nausea and vomiting -pain, swelling, redness or irritation at the injection site -pain, tingling, numbness in the hands or  feet -problems with balance, talking, walking -trouble passing urine or change in the amount of urine Side effects that usually do not require medical attention (report to your doctor or health care professional if they continue or are bothersome): -hair loss -loss of appetite -metallic taste in the mouth or changes in taste This list may not describe all possible side effects. Call your doctor for medical advice about side effects. You may report side effects to FDA at 1-800-FDA-1088. Where should I keep my medicine? This drug is given in a hospital or clinic and will not be stored at home. NOTE: This sheet is a summary. It may not cover all possible information. If you have questions about this medicine, talk to your doctor, pharmacist, or health care provider.  2019 Elsevier/Gold Standard (2007-06-27 14:38:05)

## 2018-09-20 NOTE — Progress Notes (Signed)
Spoke w/ Dr. Irene Limbo, add benadryl 25mg  and famotidine 20mg  IV today and subsequent for previously carboplatin use. This is technically a little earlier than per protocol, but will add them now and going forward to reduce risk of HSRs with carboplatin re-exposure.   Demetrius Charity, PharmD, [facility] Pharmacist Pharmacy Phone: [phone] 09/20/2018

## 2018-09-21 ENCOUNTER — Telehealth: Payer: Self-pay | Admitting: Hematology

## 2018-09-21 NOTE — Telephone Encounter (Signed)
Scheduled appt per 6/17 los.  Spoke with patient and she is aware of her appt dates and time.

## 2018-09-22 ENCOUNTER — Other Ambulatory Visit: Payer: Self-pay

## 2018-09-22 ENCOUNTER — Inpatient Hospital Stay: Payer: Medicare Other

## 2018-09-22 VITALS — BP 104/62 | HR 95 | Temp 97.8°F

## 2018-09-22 DIAGNOSIS — Z7189 Other specified counseling: Secondary | ICD-10-CM

## 2018-09-22 DIAGNOSIS — Z7689 Persons encountering health services in other specified circumstances: Secondary | ICD-10-CM | POA: Diagnosis not present

## 2018-09-22 DIAGNOSIS — C3491 Malignant neoplasm of unspecified part of right bronchus or lung: Secondary | ICD-10-CM

## 2018-09-22 DIAGNOSIS — R946 Abnormal results of thyroid function studies: Secondary | ICD-10-CM | POA: Diagnosis not present

## 2018-09-22 DIAGNOSIS — Z5111 Encounter for antineoplastic chemotherapy: Secondary | ICD-10-CM | POA: Diagnosis not present

## 2018-09-22 DIAGNOSIS — C792 Secondary malignant neoplasm of skin: Secondary | ICD-10-CM | POA: Diagnosis not present

## 2018-09-22 DIAGNOSIS — D6481 Anemia due to antineoplastic chemotherapy: Secondary | ICD-10-CM | POA: Diagnosis not present

## 2018-09-22 MED ORDER — PEGFILGRASTIM-CBQV 6 MG/0.6ML ~~LOC~~ SOSY
6.0000 mg | PREFILLED_SYRINGE | Freq: Once | SUBCUTANEOUS | Status: AC
  Administered 2018-09-22: 6 mg via SUBCUTANEOUS

## 2018-09-22 MED ORDER — PEGFILGRASTIM-CBQV 6 MG/0.6ML ~~LOC~~ SOSY
PREFILLED_SYRINGE | SUBCUTANEOUS | Status: AC
  Filled 2018-09-22: qty 0.6

## 2018-09-22 NOTE — Patient Instructions (Signed)
Pegfilgrastim injection  What is this medicine?  PEGFILGRASTIM (PEG fil gra stim) is a long-acting granulocyte colony-stimulating factor that stimulates the growth of neutrophils, a type of white blood cell important in the body's fight against infection. It is used to reduce the incidence of fever and infection in patients with certain types of cancer who are receiving chemotherapy that affects the bone marrow, and to increase survival after being exposed to high doses of radiation.  This medicine may be used for other purposes; ask your health care provider or pharmacist if you have questions.  COMMON BRAND NAME(S): Fulphila, Neulasta, UDENYCA  What should I tell my health care provider before I take this medicine?  They need to know if you have any of these conditions:  -kidney disease  -latex allergy  -ongoing radiation therapy  -sickle cell disease  -skin reactions to acrylic adhesives (On-Body Injector only)  -an unusual or allergic reaction to pegfilgrastim, filgrastim, other medicines, foods, dyes, or preservatives  -pregnant or trying to get pregnant  -breast-feeding  How should I use this medicine?  This medicine is for injection under the skin. If you get this medicine at home, you will be taught how to prepare and give the pre-filled syringe or how to use the On-body Injector. Refer to the patient Instructions for Use for detailed instructions. Use exactly as directed. Tell your healthcare provider immediately if you suspect that the On-body Injector may not have performed as intended or if you suspect the use of the On-body Injector resulted in a missed or partial dose.  It is important that you put your used needles and syringes in a special sharps container. Do not put them in a trash can. If you do not have a sharps container, call your pharmacist or healthcare provider to get one.  Talk to your pediatrician regarding the use of this medicine in children. While this drug may be prescribed for  selected conditions, precautions do apply.  Overdosage: If you think you have taken too much of this medicine contact a poison control center or emergency room at once.  NOTE: This medicine is only for you. Do not share this medicine with others.  What if I miss a dose?  It is important not to miss your dose. Call your doctor or health care professional if you miss your dose. If you miss a dose due to an On-body Injector failure or leakage, a new dose should be administered as soon as possible using a single prefilled syringe for manual use.  What may interact with this medicine?  Interactions have not been studied.  Give your health care provider a list of all the medicines, herbs, non-prescription drugs, or dietary supplements you use. Also tell them if you smoke, drink alcohol, or use illegal drugs. Some items may interact with your medicine.  This list may not describe all possible interactions. Give your health care provider a list of all the medicines, herbs, non-prescription drugs, or dietary supplements you use. Also tell them if you smoke, drink alcohol, or use illegal drugs. Some items may interact with your medicine.  What should I watch for while using this medicine?  You may need blood work done while you are taking this medicine.  If you are going to need a MRI, CT scan, or other procedure, tell your doctor that you are using this medicine (On-Body Injector only).  What side effects may I notice from receiving this medicine?  Side effects that you should report to   your doctor or health care professional as soon as possible:  -allergic reactions like skin rash, itching or hives, swelling of the face, lips, or tongue  -back pain  -dizziness  -fever  -pain, redness, or irritation at site where injected  -pinpoint red spots on the skin  -red or dark-brown urine  -shortness of breath or breathing problems  -stomach or side pain, or pain at the shoulder  -swelling  -tiredness  -trouble passing urine or  change in the amount of urine  Side effects that usually do not require medical attention (report to your doctor or health care professional if they continue or are bothersome):  -bone pain  -muscle pain  This list may not describe all possible side effects. Call your doctor for medical advice about side effects. You may report side effects to FDA at 1-800-FDA-1088.  Where should I keep my medicine?  Keep out of the reach of children.  If you are using this medicine at home, you will be instructed on how to store it. Throw away any unused medicine after the expiration date on the label.  NOTE: This sheet is a summary. It may not cover all possible information. If you have questions about this medicine, talk to your doctor, pharmacist, or health care provider.   2019 Elsevier/Gold Standard (2017-06-27 16:57:08)

## 2018-10-02 NOTE — Progress Notes (Signed)
HEMATOLOGY/ONCOLOGY CLINIC NOTE  Date of Service: 10/03/18     Patient Care Team: [name], FNP as PCP - General (Nurse Practitioner) Pixie Casino, MD as PCP - Cardiology (Cardiology)  CHIEF COMPLAINTS  F/u for continued management of metastatic lung adenocarcinoma  Diagnosis Metastatic Lung Adenocarcinoma with rt flank subcutaneous metastasis.  Current treatment:  Nivolumab q2 weeks Palliative RT to pain rt inguinal mass   PreviousTreatment -Concurrent Chemo-radiation with Carboplatin/Taxol. Completed RT on 02/13/2016 and has then completed 2 cycles of carboplatin + taxol. -Started Maintenance Durvalumab from  05/24/2016 q2weeks, switched to Nivolumab q2weeks on 12/13/16 once metastatic disease noted -s/p palliative RT to rt sided Subcutaneous metastatic mass.    HISTORY OF PRESENTING ILLNESS: Plz see my previous note for details on initial presentation.  INTERVAL HISTORY:  Marilyn [name] returns today for followup of her metastatic lung cancer. The patient's last visit with [place] was on 09/20/18. The pt reports that she is doing well overall.  The pt reports that she had some mild nausea after C1 infusion. She took some anti-nausea medications which controlled this well. She denies any vomiting, diarrhea, fevers, chills, or night sweats. She notes that she has an element of fatigue, but "not too bad." She notes that she is able to take deep breaths with ease. She notes that she isn't drinking enough water, and feels an element of light headedness in the mornings. The pt notes that she has been trying to watch her sugars better and eat better.   The pt denies any concerns for bleeding, nose bleeds or gum bleeds.   The pt notes that she has a rash that "looks like a burn," on her left breast. She notes improvement in itching after beginning Cortisone cream. She has also applied vaseline which has helped. The pt notes that she has scratched this area a lot  previously.  Lab results today (10/03/18) of CBC w/diff and CMP is as follows: all values are WNL except for RBC at 3.13, HGB at 9.5, HCT at 28.3, PLT at 34k, Potassium at 3.4, Glucose at 153, BUN at 24, Creatinine at 1.16, Calcium at 7.8, Albumin at 3.1, AST at 10, Total Bilirubin at 0.2.  On review of systems, pt reports mild nausea, slight fatigue, eating better, itchy skin rash on left breast, and denies fevers, chills, night sweats, staying hydrated, mouth sores, vomiting, diarrhea, nose bleeds, gum bleeds, concerns for bleeding, abdominal pains, leg swelling, and any other symptoms.   MEDICAL HISTORY:  Past Medical History:  Diagnosis Date   Arthritis    Asthma    Diabetes mellitus    Type II   Hemorrhoids    Hypercholesteremia    Hypertension    Hypothyroidism    Metastatic lung cancer (metastasis from lung to other site) (HCC)    Lung, Mets to skin- right flank. 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 mild - EF 45-50% by echo 10/2016 but EF 52% by low risk nuc 11/2016. b. Echo 07/2017 - EF normal, mild AI, unremarkable echo otherwise.   Obese    Pneumonia 2017   Sinus tachycardia    a. dates back to at least 2013, etiology unclear.    SURGICAL HISTORY: Past Surgical History:  Procedure Laterality Date   CESAREAN SECTION     COLONOSCOPY  I/D  arm Right 2013   IR FLUORO GUIDE PORT INSERTION RIGHT  02/18/2017   IR GENERIC HISTORICAL  01/08/2016   IR US GUIDE VASC ACCESS RIGHT 01/08/2016 [name], DO [facility]   IR GENERIC HISTORICAL  01/08/2016   IR FLUORO GUIDE CV LINE RIGHT 01/08/2016 [name], DO [facility]   IR THORACENTESIS ASP PLEURAL SPACE W/IMG GUIDE  05/25/2018   IR US GUIDE VASC ACCESS RIGHT  02/18/2017   MULTIPLE EXTRACTIONS WITH ALVEOLOPLASTY N/A 03/25/2017   Procedure: MULTIPLE  EXTRACTION WITH ALVEOLOPLASTY (teeth #s four, six, seven, eight, nine, ten, eleven, one, twenty-three, twenty-four, twenty-five, twenty-six, twenty-nine, thirty);  Surgeon: [name], [name];  Location: [facility];  Service: Oral Surgery;  Laterality: N/A;   TUBAL LIGATION     US guided core needle biopsy     of right lower neck/supraclavicular lymph nodes    SOCIAL HISTORY: Social History   Socioeconomic History   Marital status: Divorced    Spouse name: Not on file   Number of children: Not on file   Years of education: Not on file   Highest education level: Not on file  Occupational History   Not on file  Social Needs   Financial resource strain: Not on file   Food insecurity    Worry: Not on file    Inability: Not on file   Transportation needs    Medical: No    Non-medical: No  Tobacco Use   Smoking status: Never Smoker   Smokeless tobacco: Never Used  Substance and Sexual Activity   Alcohol use: No   Drug use: No   Sexual activity: Not Currently  Lifestyle   Physical activity    Days per week: Not on file    Minutes per session: Not on file   Stress: Not on file  Relationships   Social connections    Talks on phone: Not on file    Gets together: Not on file    Attends religious service: Not on file    Active member of club or organization: Not on file    Attends meetings of clubs or organizations: Not on file    Relationship status: Not on file   Intimate partner violence    Fear of current or ex partner: No    Emotionally abused: No    Physically abused: No    Forced sexual activity: No  Other Topics Concern   Not on file  Social History Narrative   No bird or mold exposure. No recent travel.   11-23-17 Unable to ask abuse questions mother with her today.    FAMILY HISTORY: Family History  Problem Relation Age of Onset   Heart failure Mother    Hypertension Mother    Cancer Neg Hx    Rheumatologic disease Neg Hx      ALLERGIES:  has No Known Allergies.  MEDICATIONS:  Current Outpatient Medications  Medication Sig Dispense Refill   ACCU-CHEK AVIVA PLUS test strip CHECK BLOOD SUGAR THREE TIMES A DAY BEFORE MEALS DIRECTED     amoxicillin-clavulanate (AUGMENTIN) 875-125 MG tablet Take 1 tablet by mouth 2 (two) times daily. (Patient not taking: Reported on 08/10/2018) 14 tablet 0   ASPIRIN LOW DOSE 81 MG EC tablet TAKE 1 TABLET BY MOUTH EVERY DAY (Patient taking differently: Take 81 mg by mouth daily. Enteric Coated.  ** DO NOT CRUSH **) 60 tablet 2   atorvastatin (LIPITOR) 40 MG tablet Take 1 tablet (40 mg total) by mouth  daily at 6 PM. 30 tablet 0   clotrimazole-betamethasone (LOTRISONE) cream Apply 1 application topically 2 (two) times daily. To areas of skin rash 30 g 0   dexamethasone (DECADRON) 4 MG tablet Take 1 tab two times a day the day before Alimta chemo. Take 2 tabs two times a day starting the day after chemo for 3 days. 30 tablet 1   DULoxetine (CYMBALTA) 60 MG capsule TAKE 1 CAPSULE BY MOUTH EVERY DAY (Patient taking differently: Take 60 mg by mouth daily. ) 30 capsule 2   fluconazole (DIFLUCAN) 200 MG tablet Take 1 tablet (200 mg total) by mouth daily. 1 tablet 0   folic acid (FOLVITE) 1 MG tablet Take 1 tablet (1 mg total) by mouth daily. Start 5-7 days before Alimta chemotherapy. Continue until 21 days after Alimta completed. 100 tablet 3   hydrOXYzine (ATARAX/VISTARIL) 10 MG tablet Take 1 tablet (10 mg total) by mouth 3 (three) times daily as needed for itching. 30 tablet 0   insulin aspart (NOVOLOG) 100 UNIT/ML FlexPen As per sliding scale instructions (Patient taking differently: Inject 0-10 Units into the skin 3 (three) times daily. As per sliding scale instructions) 15 mL 2   Insulin Degludec (TRESIBA FLEXTOUCH) 200 UNIT/ML SOPN Inject 100 Units into the skin daily before breakfast.      levalbuterol (XOPENEX HFA) 45 MCG/ACT inhaler Inhale 1-2 puffs into the lungs every 8  (eight) hours as needed for wheezing. 1 Inhaler 12   levothyroxine (SYNTHROID, LEVOTHROID) 150 MCG tablet TAKE 1 TABLET BY MOUTH EVERY DAY BEFORE BREAKFAST (Patient taking differently: Take 150 mcg by mouth daily before breakfast. ) 30 tablet 2   lidocaine-prilocaine (EMLA) cream Apply 1 application topically as needed. 30 g 6   lidocaine-prilocaine (EMLA) cream Apply to affected area once 30 g 3   lidocaine-prilocaine (EMLA) cream Apply to affected area once 30 g 3   LORazepam (ATIVAN) 0.5 MG tablet Take 1 tablet (0.5 mg total) by mouth every 6 (six) hours as needed (Nausea or vomiting). 30 tablet 0   metoprolol succinate (TOPROL-XL) 50 MG 24 hr tablet Take 1 tablet (60m) by mouth in the morning and 1/2 tablet (265m in the evening. Take with or immediately following a meal. 135 tablet 3   naproxen (NAPROSYN) 500 MG tablet Take 1 tablet (500 mg total) by mouth 2 (two) times daily with a meal. (Patient not taking: Reported on 08/10/2018) 20 tablet 0   nitrofurantoin, macrocrystal-monohydrate, (MACROBID) 100 MG capsule Take 1 capsule (100 mg total) by mouth every 12 (twelve) hours. (Patient not taking: Reported on 08/10/2018) 8 capsule 0   omeprazole (PRILOSEC) 20 MG capsule Take by mouth daily.     ondansetron (ZOFRAN) 8 MG tablet Take 1 tablet (8 mg total) by mouth 2 (two) times daily as needed for refractory nausea / vomiting. Start on day 3 after chemo. 30 tablet 1   oxyCODONE-acetaminophen (PERCOCET) 5-325 MG tablet Take 1 tablet by mouth every 6 (six) hours as needed for moderate pain or severe pain. 50 tablet 0   pregabalin (LYRICA) 50 MG capsule Take 50 mg by mouth 3 (three) times daily.     prochlorperazine (COMPAZINE) 10 MG tablet Take 1 tablet (10 mg total) by mouth every 6 (six) hours as needed (Nausea or vomiting). 30 tablet 1   senna-docusate (SENNA S) 8.6-50 MG tablet Take 2 tablets by mouth 2 (two) times daily. May reduce to 2 tab po HS as needed once regular BM estabished  (Patient taking differently:  Take 2 tablets by mouth 2 (two) times daily as needed (FOR CONSTIPATION). =) 60 tablet 1   traZODone (DESYREL) 50 MG tablet Take 1-2 tablets (50-100 mg total) by mouth at bedtime as needed for sleep. (Patient not taking: Reported on 08/10/2018) 60 tablet 0   Vitamins A & D (VITAMIN A & D) OINT Apply 1 application topically 2 (two) times daily. 1 Tube 3   No current facility-administered medications for this visit.    Facility-Administered Medications Ordered in Other Visits  Medication Dose Route Frequency Provider Last Rate Last Dose   sodium chloride 0.9 % injection 10 mL  10 mL Intravenous PRN [name], MD   10 mL at 05/24/16 1252    REVIEW OF SYSTEMS:    A 10+ POINT REVIEW OF SYSTEMS WAS OBTAINED including neurology, dermatology, psychiatry, cardiac, respiratory, lymph, extremities, GI, GU, Musculoskeletal, constitutional, breasts, reproductive, HEENT.  All pertinent positives are noted in the HPI.  All others are negative.   PHYSICAL EXAMINATION: ECOG PERFORMANCE STATUS: 1 - Symptomatic but completely ambulatory  Vitals:   10/03/18 0842  BP: 128/75  Pulse: 92  Resp: 17  Temp: 98.3 F (36.8 C)  SpO2: 100%   Filed Weights   10/03/18 0842  Weight: 256 lb (116.1 kg)   .Body mass index is 37.8 kg/m.  GENERAL:alert, in no acute distress and comfortable SKIN: eczematoid rash with some inflammatory papules and secondary hyperpigmentation EYES: conjunctiva are pink and non-injected, sclera anicteric OROPHARYNX: MMM, no exudates, no oropharyngeal erythema or ulceration NECK: supple, no JVD LYMPH:  no palpable lymphadenopathy in the cervical, axillary or inguinal regions LUNGS: fluid build up 1/4 posterior right chest wall HEART: regular rate & rhythm ABDOMEN:  normoactive bowel sounds , non tender, not distended. No palpable hepatosplenomegaly.  Extremity: no pedal edema PSYCH: alert & oriented x 3 with fluent speech NEURO: no focal  motor/sensory deficits   LABORATORY DATA: .  [name] CBC Latest Ref Rng & Units 10/03/2018 09/20/2018 08/30/2018  WBC 4.0 - 10.5 K/uL 6.8 6.3 5.2  Hemoglobin 12.0 - 15.0 g/dL 9.5(L) 10.6(L) 10.0(L)  Hematocrit 36.0 - 46.0 % 28.3(L) 32.2(L) 31.6(L)  Platelets 150 - 400 K/uL 34(L) 261 286    . CMP Latest Ref Rng & Units 10/03/2018 09/20/2018 08/30/2018  Glucose 70 - 99 mg/dL 153(H) 353(H) 306(H)  BUN 6 - 20 mg/dL 24(H) 10 7  Creatinine 0.44 - 1.00 mg/dL 1.16(H) 0.88 0.80  Sodium 135 - 145 mmol/L 142 135 138  Potassium 3.5 - 5.1 mmol/L 3.4(L) 4.3 4.0  Chloride 98 - 111 mmol/L 105 97(L) 101  CO2 22 - 32 mmol/L _0 Calcium 8.9 - 10.3 mg/dL 7.8(L) 8.9 8.9  Total Protein 6.5 - 8.1 g/dL 6.8 7.9 7.3  Total Bilirubin 0.3 - 1.2 mg/dL 0.2(L) 0.3 0.3  Alkaline Phos 38 - 126 U/L 91 106 98  AST 15 - 41 U/L 10(L) 15 13(L)  ALT 0 - 44 U/L _1 09/15/18 Pleural Fluid Cytology:             08/18/17 Bx:   08/16/17 Molecular Pathology:    RADIOGRAPHIC STUDIES:  .Dg Chest 1 View  Result Date: 09/15/2018 CLINICAL DATA:  S/P right thoracentesis that yielded1 liter of fluid. EXAM: CHEST  1 VIEW COMPARISON:  Chest CT 611 2020 FINDINGS: Reduction in volume of lower RIGHT pleural effusion. No pneumothorax. RIGHT perihilar density noted. LEFT lung clear IMPRESSION: No pneumothorax following thoracentesis.  Reduction in pleural fluid  Electronically Signed   By: Suzy Bouchard M.D.   On: 09/15/2018 12:47   Ct Chest Wo Contrast  Result Date: 09/14/2018 CLINICAL DATA:  Follow-up lung cancer. EXAM: CT CHEST WITHOUT CONTRAST TECHNIQUE: Multidetector CT imaging of the chest was performed following the standard protocol without IV contrast. COMPARISON:  07/21/2018 FINDINGS: Cardiovascular: Heart size appears normal. No pericardial effusion. Mild aortic atherosclerosis. Mediastinum/Nodes: Normal appearance of the thyroid gland. The trachea appears patent and is midline. Normal appearance of the  esophagus. Index right axillary lymph node measures 2.4 cm, image 48/2. Previously 2.2 cm. Index right axillary lymph node measures 1.7 cm, image 38/2. Previously 1.5 cm. The index left supraclavicular lymph node measures 1.8 cm, image 11/2. Unchanged. The index right paratracheal lymph node measures 1.8 cm, image 54/2. Previously 1.6 cm. Lungs/Pleura: Moderate volume right pleural effusion is identified, stable to slightly decreased in size from previous exam. Paramediastinal radiation change within a geographic distribution in the right upper lobe with masslike architectural distortion in the right hilum is similar to previous study. No suspicious pulmonary nodules. Upper Abdomen: No acute abnormality. Multiples large stones within the gallbladder. Musculoskeletal: Right breast skin thickening is again noted. No suspicious bone lesion. IMPRESSION: 1. Persistent right axillary, mediastinal, and left supraclavicular adenopathy. Stable to mild increase in size of these lymph nodes period. 2. Stable to mild decrease in size of moderate size right pleural effusion. 3. Stable skin thickening of the right breast. Electronically Signed   By: Kerby Moors M.D.   On: 09/14/2018 14:48   US Thoracentesis Asp Pleural Space W/img Guide  Result Date: 09/15/2018 INDICATION: Patient with history of metastatic lung cancer, recurrent right pleural effusion, dyspnea. Request made for diagnostic and therapeutic right thoracentesis. EXAM: ULTRASOUND GUIDED DIAGNOSTIC AND THERAPEUTIC RIGHT THORACENTESIS MEDICATIONS: None COMPLICATIONS: None immediate. PROCEDURE: An ultrasound guided thoracentesis was thoroughly discussed with the patient and questions answered. The benefits, risks, alternatives and complications were also discussed. The patient understands and wishes to proceed with the procedure. Written consent was obtained. Ultrasound was performed to localize and mark an adequate pocket of fluid in the right chest. The area  was then prepped and draped in the normal sterile fashion. 1% Lidocaine was used for local anesthesia. Under ultrasound guidance a 6 Fr Safe-T-Centesis catheter was introduced. Thoracentesis was performed. The catheter was removed and a dressing applied. FINDINGS: A total of approximately 1 liter of hazy, amber fluid was removed. Samples were sent to the laboratory as requested by the clinical team. IMPRESSION: Successful ultrasound guided diagnostic and therapeutic right thoracentesis yielding 1 liter of pleural fluid. Read by: Rowe Robert, PA-C Electronically Signed   By: Aletta Edouard M.D.   On: 09/15/2018 13:09     ASSESSMENT & PLAN:   53 y.o.  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  -Noted to have a small mass in the left adrenal on CT but PET/CT neg for metastatic disease. Patient has been a lifelong nonsmoker -MRI of the brain was negative for any metastatic disease. At diagnosis. -High PDL1 expression (90%) on [facility] Neg for EGFR, ALK, ROS-1 and BRAF mutations. -Patient completed her planned definitive chemoradiation with Carbo Taxol on 02/13/2016. No prohibitive toxicities other than some grade  1 skin desquamation and some grade 1-2 radiation esophagitis. She has subsequently received 2 out of 2 planned dose of carboplatin + Taxol. -CTA chest 10/29/2016  no evidence of lung cancer progression in the chest. No PE -CT abd/pelvis-10/29/2016  Interval 5.0 x 5.0 x 4.7 cm mass in the right lateral subcutaneous fat at the level of the mid abdomen. This has CT features compatible with a metastasis or primary neoplasm. -CT C/A/P (05/12/2017): Interval development of  right axillary, right external iliac and right inguinal adenopathy. Suspicious for nodal metastasis. 2. Decrease in size of right paratracheal adenopathy. 3. Decrease in size and enhancement associated with right lateral body wall lesion. 4. New small nonspecific pulmonary nodule in the left lower lobe measuring 6 mm. 5. Stable appearance of changes secondary to external beam radiation within the right lung  08/03/17 PET which revealed 2.8 cm right axillary lymph node is markedly hypermetabolic and consistent with metastatic disease. Extensive radiation changes involving the right paramediastinal lung. There is a focus of hypermetabolism in the right lower lobe which is suspicious for residual or recurrent tumor.  11/11/17 CT C/A/P revealed There has been interval increase in number of enlarged of right axillary nodes and increase in size of right inguinal adenopathy compatible with progression of disease. Stable appearance of the right lung with changes of external beam radiation.    03/01/18 CT C/A/P revealed Bulky right subpectoral/axillary adenopathy, markedly progressive from 11/11/2017. Enlarging retrocrural lymph node. Low right paratracheal adenopathy is stable. 2. Interval resection of a right inguinal nodal mass. Stable right external iliac lymph node. 3. Cholelithiasis.  Stable common bile duct dilatation. 4.  Aortic atherosclerosis.  she received maintenance Durvalumab from  05/24/2016 q2weeks, switched to Nivolumab q2weeks on 12/13/16 in the setting of Biopsy proven isolated metastatic disease. Given the tumor is strongly PDL1 positive and patient has only isolated metastatic disease was treated with palliative RT to metastases with significant improvement. Molecular pathology from 515/19 did not reveal obviously targetable mutations with NF1 and TP53 mutations present.  05/24/18 CT Chest revealed Mixed interval change. 2. Moderate dependent right pleural effusion, significantly increased. 3. Right  axillary adenopathy is decreased. 4. New left supraclavicular and left posterior mediastinal lymphadenopathy. Increased right retrocrural adenopathy. These findings are worrisome for progression of metastatic nodal disease. 5. New mild patchy nodular consolidation in the peripheral right middle lobe, which could be inflammatory or neoplastic. 6. Similar finely nodular patchy thickening of the peripheral peribronchovascular interstitium and interlobular septa in the lower lungs bilaterally, nonspecific, lymphangitic tumor not excluded. 7. Nonspecific increased right breast skin thickening, possibly treatment related. Aortic Atherosclerosis.  05/30/18 CT Abdomen revealed Cholelithiasis, without associated inflammatory changes to suggest acute cholecystitis. Moderate right pleural effusion. Please note that the pelvis was not imaged.  07/21/18 CT Chest revealed "Progressive left supraclavicular lymphadenopathy. Right axillary, mediastinal and hilar adenopathy is grossly stable, suboptimally evaluated without contrast. These findings remain consistent with metastatic disease. 2. Slight enlargement of moderate-sized right pleural effusion with extension into the minor fissure. This limits evaluation of the previously demonstrated right middle lobe nodular density. This effusion could be malignant. No discretely enlarging pulmonary nodules identified. 3. Stable skin thickening in the right breast. 4. PET CT follow-up may be helpful to assess for hypermetabolism in the lymph nodes and right pleural space."  Pt was previously seen to have progression through immunotherapy treatment and have been holding Nivolumab  09/14/18 CT Chest revealed "Persistent right axillary, mediastinal, and left supraclavicular adenopathy. Stable to mild increase in  size of these lymph nodes period. 2. Stable to mild decrease in size of moderate size right pleural effusion. 3. Stable skin thickening of the right breast." S/p 09/15/18  Thoracentesis removed 1L of fluid 09/15/18 Cytology revealed malignant cells in the removed pleural fluid  Plan:  -Discussed pt labwork today, 10/03/18; WBC holding, PLT lower at 34k. Potassium borderline low at 3.4. Element of dehydration. Sugars are better today at 153. -Will watch PLT again in one week, and expect these to increase in the interim. Avoid NSAIDs. -The pt has no prohibitive toxicities from C1 Carboplatin and Alimta with G-CSF support at this time.  -Aiming to tentatively add Avastin with C2, if blood counts respond appropriately and BP is well controlled.  -Would consider dose adjustments to C2 ctx if necessary as well. -Continue PO Folic acid replacement -Recommend staying better hydrated and consuming orange juice, coconut water, bananas and other potassium rich foods -Advised that pt let me know if she develops worsened SOB for consideration of repeating thoracentesis -Continue salt and baking soda mouthwashes 4-5 times a day for 7 days following chemotherapy infusions -Beginning [place] Vitamin B12 every third cycle -Continue Lotrisone and recommended not scratching skin with fingernails, but with palms if she has to scratch -If rash is persistent through Lotrisone, might need dermatology referral -Will repeat scans after C4 -Plan C1 Carboplatin and Alimta and then adding Avastin with future cycles after initial tolerance is displayed. Recommend pursuing up to 4 cycles with G-CSF support given previous ctx exposure. Would then tentatively move to maintenance Alimta and Avastin. -Radiation-related skin changes with right breast ? -Advised crowd avoidance and infection prevention strategies -Continue with outpatient PT -Vitamin A&D ointment and Hydorxyzine for vaginal itching -Advised that pt continue follow up with her cardiologist -Follow up with PCP Selina Cooley, FNP for DM management -Will see the pt back in one week   #3 h/o radiation pneumonitis - no new symptoms. Does  has some cough with exertion but this has improved.  #4 Improving fatigue (grade 1) - improved fatigue with levothyroxine compliance with near normalization of FT4 levels.  - previously given TSH of 12 -- increased Levothyroxine from 122mg to 1531m  ---will likely need larger dose but in the setting of baseline tachycardia we are gradually increasing dose to avoid ppt cardiac arrhythmias.  TSH elevated due to medication non compliance.  #5 normocytic anemia related to chemotherapy -resolving and stable  #6 Left chest wall pain due to costochondritis - mx with pain medications. CTA x 3 neg for PE. No significant pain at this time.  #7 Sinus tachycardia - on BB per cards , no PE on CTA x 3 , -Still having sinus tachycardia . Partly from deconditioning and anemia. Seeing Dr. HiDebara Pickettrom cardiology  -Continues to be on BB  #8 neoplasm related pain is currently controlled without significant medications. Clinically likely has sleep apnea . Would need to be careful with sedative medications . 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 The patient was provided an opportunity to ask questions and all were answered. The patient agreed with the plan and demonstrated an understanding of the  instructions.   The patient was advised to call back or seek an in-person evaluation if the symptoms worsen or if the condition fails to improve as anticipated.  The total time spent in the appt was 25  minutes and more than 50% was on counseling and direct patient cares.   Sullivan Lone MD [place] AAHIVMS [facility] Hematology/Oncology Physician [facility]  (Office):       [phone] (Work cell):  [phone] (Fax):           [phone]  I, Baldwin Jamaica, am acting as a scribe for Dr. Sullivan Lone.   .I have reviewed the above documentation for accuracy and completeness, and I agree with the above. [name] MD

## 2018-10-03 ENCOUNTER — Inpatient Hospital Stay: Payer: Medicare Other

## 2018-10-03 ENCOUNTER — Inpatient Hospital Stay (HOSPITAL_BASED_OUTPATIENT_CLINIC_OR_DEPARTMENT_OTHER): Payer: Medicare Other | Admitting: Hematology

## 2018-10-03 ENCOUNTER — Telehealth: Payer: Self-pay | Admitting: Hematology

## 2018-10-03 ENCOUNTER — Other Ambulatory Visit: Payer: Self-pay

## 2018-10-03 VITALS — BP 128/75 | HR 92 | Temp 98.3°F | Resp 17 | Ht 69.0 in | Wt 256.0 lb

## 2018-10-03 DIAGNOSIS — Z7689 Persons encountering health services in other specified circumstances: Secondary | ICD-10-CM | POA: Diagnosis not present

## 2018-10-03 DIAGNOSIS — I890 Lymphedema, not elsewhere classified: Secondary | ICD-10-CM

## 2018-10-03 DIAGNOSIS — M129 Arthropathy, unspecified: Secondary | ICD-10-CM

## 2018-10-03 DIAGNOSIS — I700 Atherosclerosis of aorta: Secondary | ICD-10-CM

## 2018-10-03 DIAGNOSIS — C3490 Malignant neoplasm of unspecified part of unspecified bronchus or lung: Secondary | ICD-10-CM

## 2018-10-03 DIAGNOSIS — G4700 Insomnia, unspecified: Secondary | ICD-10-CM

## 2018-10-03 DIAGNOSIS — D6481 Anemia due to antineoplastic chemotherapy: Secondary | ICD-10-CM | POA: Diagnosis not present

## 2018-10-03 DIAGNOSIS — T451X5A Adverse effect of antineoplastic and immunosuppressive drugs, initial encounter: Secondary | ICD-10-CM | POA: Diagnosis not present

## 2018-10-03 DIAGNOSIS — Z95828 Presence of other vascular implants and grafts: Secondary | ICD-10-CM

## 2018-10-03 DIAGNOSIS — Z7189 Other specified counseling: Secondary | ICD-10-CM

## 2018-10-03 DIAGNOSIS — C3491 Malignant neoplasm of unspecified part of right bronchus or lung: Secondary | ICD-10-CM | POA: Diagnosis not present

## 2018-10-03 DIAGNOSIS — J45909 Unspecified asthma, uncomplicated: Secondary | ICD-10-CM

## 2018-10-03 DIAGNOSIS — E1165 Type 2 diabetes mellitus with hyperglycemia: Secondary | ICD-10-CM

## 2018-10-03 DIAGNOSIS — Z79899 Other long term (current) drug therapy: Secondary | ICD-10-CM

## 2018-10-03 DIAGNOSIS — M940 Chondrocostal junction syndrome [Tietze]: Secondary | ICD-10-CM

## 2018-10-03 DIAGNOSIS — C792 Secondary malignant neoplasm of skin: Secondary | ICD-10-CM

## 2018-10-03 DIAGNOSIS — R000 Tachycardia, unspecified: Secondary | ICD-10-CM

## 2018-10-03 DIAGNOSIS — R946 Abnormal results of thyroid function studies: Secondary | ICD-10-CM | POA: Diagnosis not present

## 2018-10-03 DIAGNOSIS — Z794 Long term (current) use of insulin: Secondary | ICD-10-CM

## 2018-10-03 DIAGNOSIS — R739 Hyperglycemia, unspecified: Secondary | ICD-10-CM

## 2018-10-03 DIAGNOSIS — E1140 Type 2 diabetes mellitus with diabetic neuropathy, unspecified: Secondary | ICD-10-CM

## 2018-10-03 DIAGNOSIS — K8020 Calculus of gallbladder without cholecystitis without obstruction: Secondary | ICD-10-CM

## 2018-10-03 DIAGNOSIS — G893 Neoplasm related pain (acute) (chronic): Secondary | ICD-10-CM | POA: Diagnosis not present

## 2018-10-03 DIAGNOSIS — Z9114 Patient's other noncompliance with medication regimen: Secondary | ICD-10-CM

## 2018-10-03 DIAGNOSIS — Z923 Personal history of irradiation: Secondary | ICD-10-CM

## 2018-10-03 DIAGNOSIS — I10 Essential (primary) hypertension: Secondary | ICD-10-CM

## 2018-10-03 DIAGNOSIS — F17210 Nicotine dependence, cigarettes, uncomplicated: Secondary | ICD-10-CM

## 2018-10-03 DIAGNOSIS — C3492 Malignant neoplasm of unspecified part of left bronchus or lung: Secondary | ICD-10-CM

## 2018-10-03 DIAGNOSIS — E039 Hypothyroidism, unspecified: Secondary | ICD-10-CM

## 2018-10-03 DIAGNOSIS — R234 Changes in skin texture: Secondary | ICD-10-CM

## 2018-10-03 DIAGNOSIS — Z5111 Encounter for antineoplastic chemotherapy: Secondary | ICD-10-CM | POA: Diagnosis not present

## 2018-10-03 DIAGNOSIS — E7800 Pure hypercholesterolemia, unspecified: Secondary | ICD-10-CM

## 2018-10-03 DIAGNOSIS — Z452 Encounter for adjustment and management of vascular access device: Secondary | ICD-10-CM

## 2018-10-03 DIAGNOSIS — J90 Pleural effusion, not elsewhere classified: Secondary | ICD-10-CM

## 2018-10-03 DIAGNOSIS — E300 Delayed puberty: Secondary | ICD-10-CM

## 2018-10-03 LAB — CBC WITH DIFFERENTIAL/PLATELET
Abs Immature Granulocytes: 0.04 10*3/uL (ref 0.00–0.07)
Basophils Absolute: 0 10*3/uL (ref 0.0–0.1)
Basophils Relative: 0 %
Eosinophils Absolute: 0 10*3/uL (ref 0.0–0.5)
Eosinophils Relative: 0 %
HCT: 28.3 % — ABNORMAL LOW (ref 36.0–46.0)
Hemoglobin: 9.5 g/dL — ABNORMAL LOW (ref 12.0–15.0)
Immature Granulocytes: 1 %
Lymphocytes Relative: 14 %
Lymphs Abs: 1 10*3/uL (ref 0.7–4.0)
MCH: 30.4 pg (ref 26.0–34.0)
MCHC: 33.6 g/dL (ref 30.0–36.0)
MCV: 90.4 fL (ref 80.0–100.0)
Monocytes Absolute: 0.5 10*3/uL (ref 0.1–1.0)
Monocytes Relative: 8 %
Neutro Abs: 5.2 10*3/uL (ref 1.7–7.7)
Neutrophils Relative %: 77 %
Platelets: 34 10*3/uL — ABNORMAL LOW (ref 150–400)
RBC: 3.13 MIL/uL — ABNORMAL LOW (ref 3.87–5.11)
RDW: 11.9 % (ref 11.5–15.5)
WBC Morphology: INCREASED
WBC: 6.8 10*3/uL (ref 4.0–10.5)
nRBC: 0 % (ref 0.0–0.2)

## 2018-10-03 LAB — CMP (CANCER CENTER ONLY)
ALT: 18 U/L (ref 0–44)
AST: 10 U/L — ABNORMAL LOW (ref 15–41)
Albumin: 3.1 g/dL — ABNORMAL LOW (ref 3.5–5.0)
Alkaline Phosphatase: 91 U/L (ref 38–126)
Anion gap: 11 (ref 5–15)
BUN: 24 mg/dL — ABNORMAL HIGH (ref 6–20)
CO2: 26 mmol/L (ref 22–32)
Calcium: 7.8 mg/dL — ABNORMAL LOW (ref 8.9–10.3)
Chloride: 105 mmol/L (ref 98–111)
Creatinine: 1.16 mg/dL — ABNORMAL HIGH (ref 0.44–1.00)
GFR, Est AFR Am: >60 mL/min (ref >60)
GFR, Estimated: 55 mL/min — ABNORMAL LOW (ref >60)
Glucose, Bld: 153 mg/dL — ABNORMAL HIGH (ref 70–99)
Potassium: 3.4 mmol/L — ABNORMAL LOW (ref 3.5–5.1)
Sodium: 142 mmol/L (ref 135–145)
Total Bilirubin: 0.2 mg/dL — ABNORMAL LOW (ref 0.3–1.2)
Total Protein: 6.8 g/dL (ref 6.5–8.1)

## 2018-10-03 MED ORDER — SODIUM CHLORIDE 0.9% FLUSH
10.0000 mL | INTRAVENOUS | Status: DC | PRN
  Administered 2018-10-03: 10 mL via INTRAVENOUS
  Filled 2018-10-03: qty 10

## 2018-10-03 MED ORDER — HEPARIN SOD (PORK) LOCK FLUSH 100 UNIT/ML IV SOLN
500.0000 [IU] | Freq: Once | INTRAVENOUS | Status: AC | PRN
  Administered 2018-10-03: 500 [IU] via INTRAVENOUS
  Filled 2018-10-03: qty 5

## 2018-10-03 NOTE — Patient Instructions (Signed)

## 2018-10-03 NOTE — Telephone Encounter (Signed)
Per 6/30 los F/u as per scheduled appointments on 10/11/2018

## 2018-10-09 IMAGING — CT CT ABD-PELV W/ CM
2 of 5 series · 14 of 46 positions shown, 16 images · IV contrast (OMNIPAQUE)
Comparison: PET-CT 08/03/2017 and CT CAP 07/28/2017..

CLINICAL DATA: Followup lung cancer.

EXAM:
CT CHEST, ABDOMEN, AND PELVIS WITH CONTRAST
TECHNIQUE: Multidetector CT imaging of the chest, abdomen and pelvis was
performed following the standard protocol during bolus
administration of intravenous contrast.
CONTRAST:  100mL OMNIPAQUE IOHEXOL 300 MG/ML  SOLN

[Series 2: cap with · axial · 0.83mm/px · z∈[-620,-75]mm · 11 of 129 slices shown, 13 images]
[im 10/129  soft-tissue]
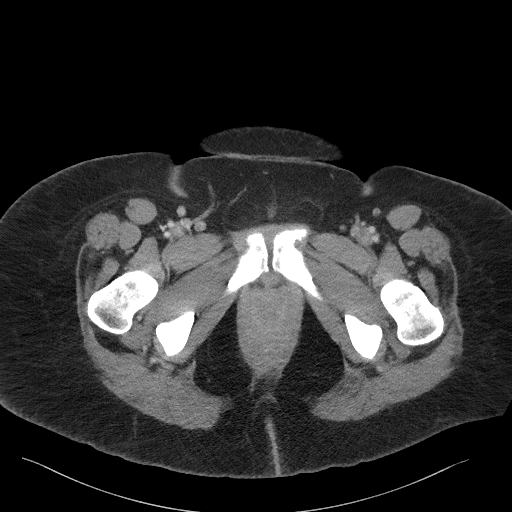
[im 10/129  bone]
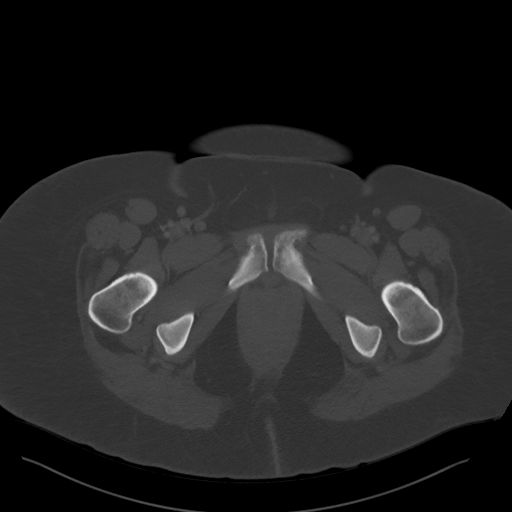
[im 20/129  soft-tissue]
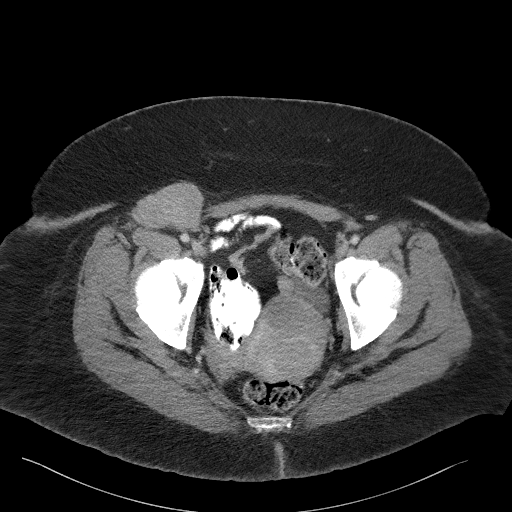
[im 30/129  soft-tissue]
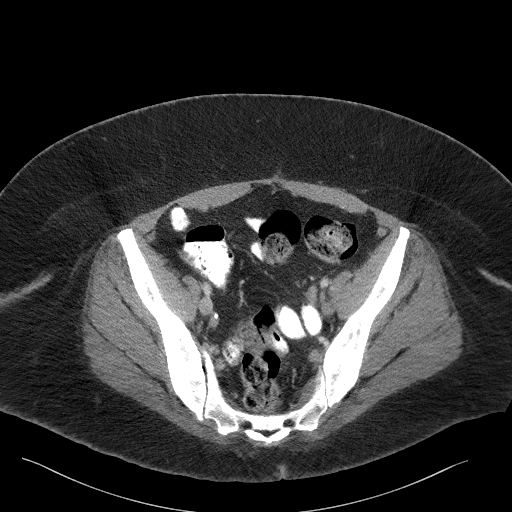
[im 40/129  soft-tissue]
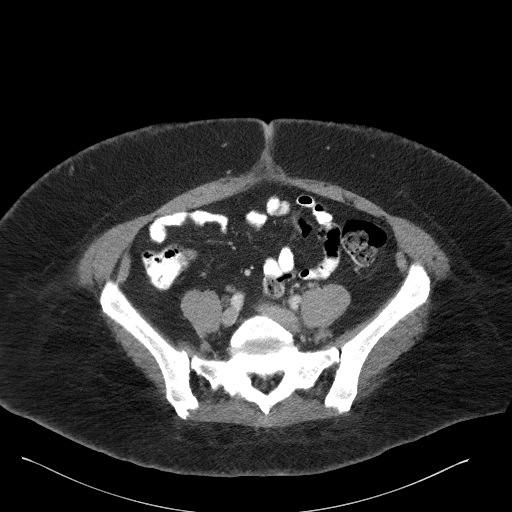
[im 50/129  soft-tissue]
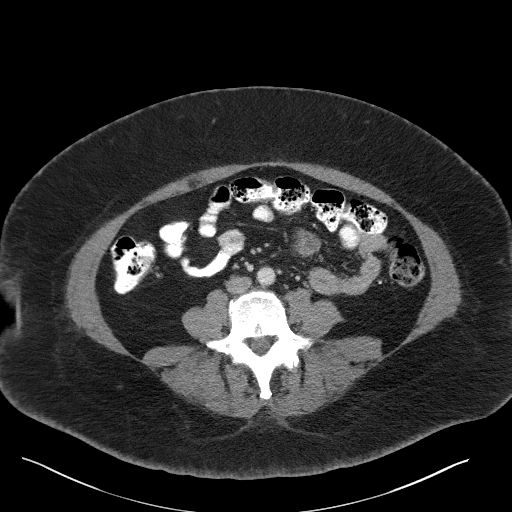
[im 69/129  soft-tissue]
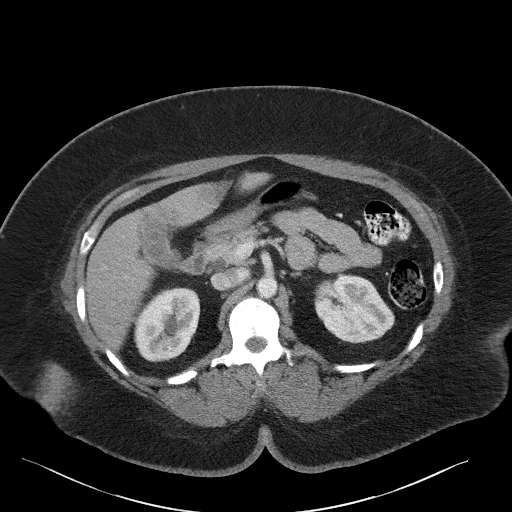
[im 79/129  soft-tissue]
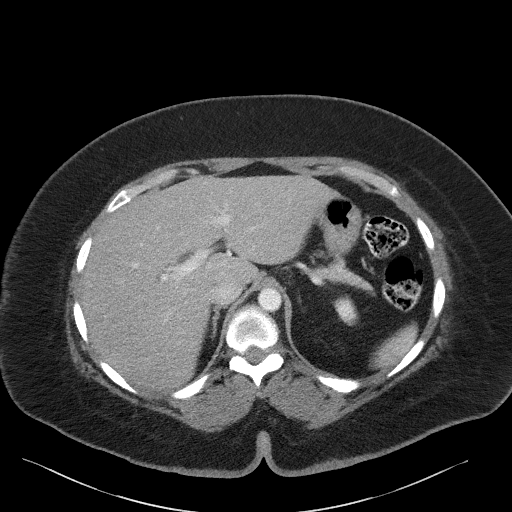
[im 89/129  soft-tissue]
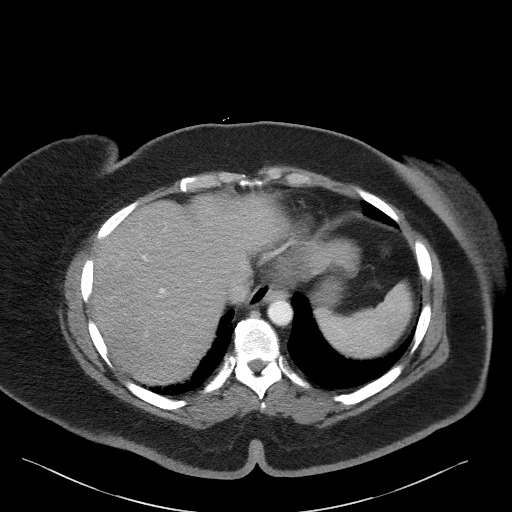
[im 99/129  soft-tissue]
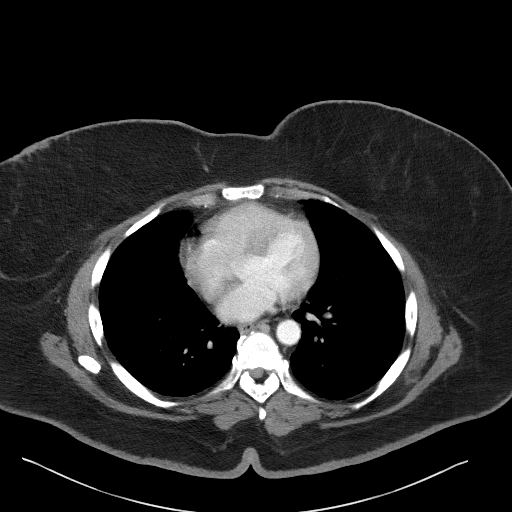
[im 99/129  bone]
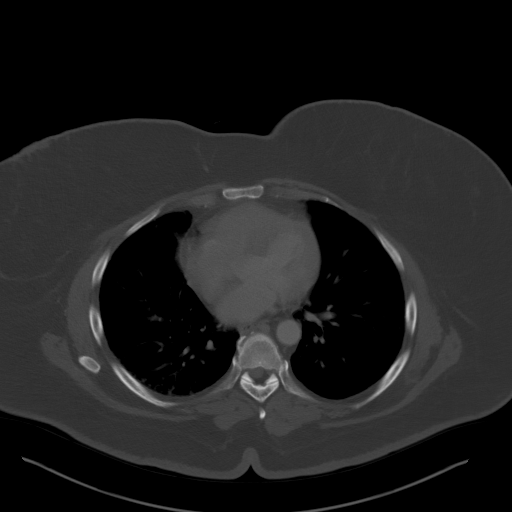
[im 109/129  soft-tissue]
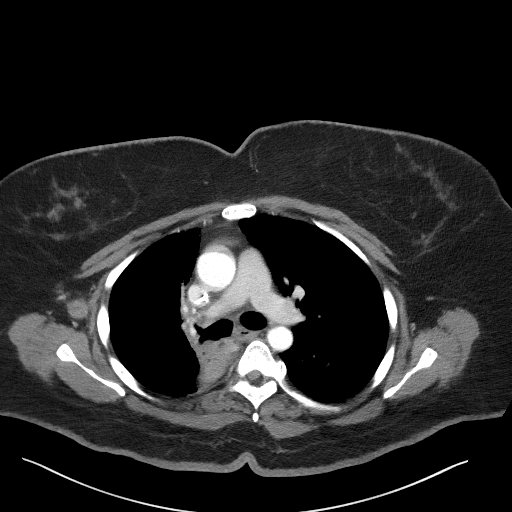
[im 119/129  soft-tissue]
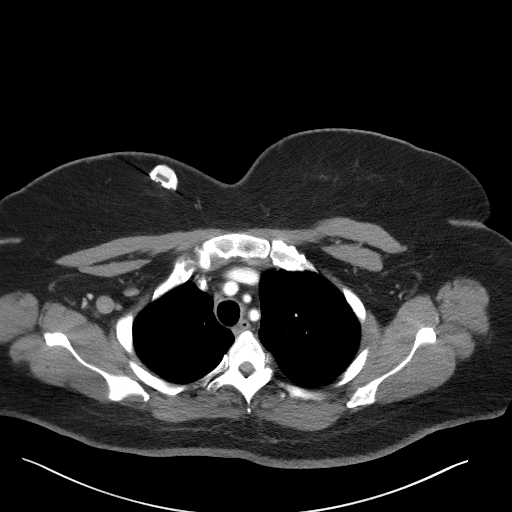

[Series 4: coronals · coronal · 0.92mm/px · 3 of 121 slices shown]
[im 41/121  soft-tissue]
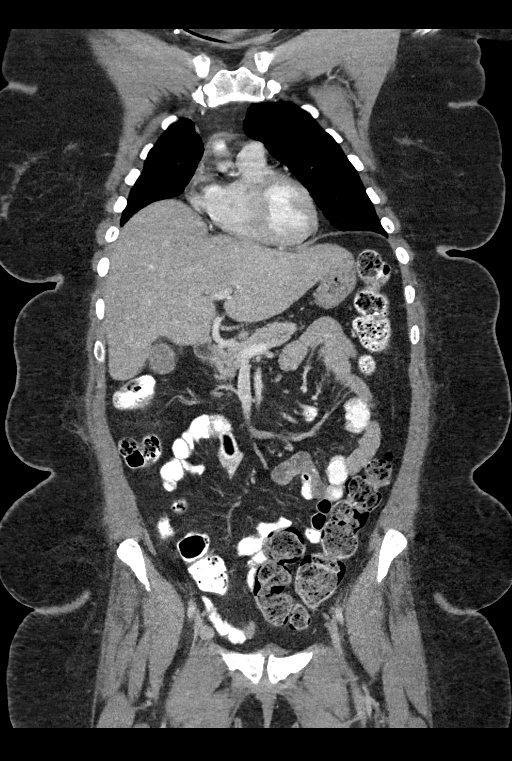
[im 54/121  soft-tissue]
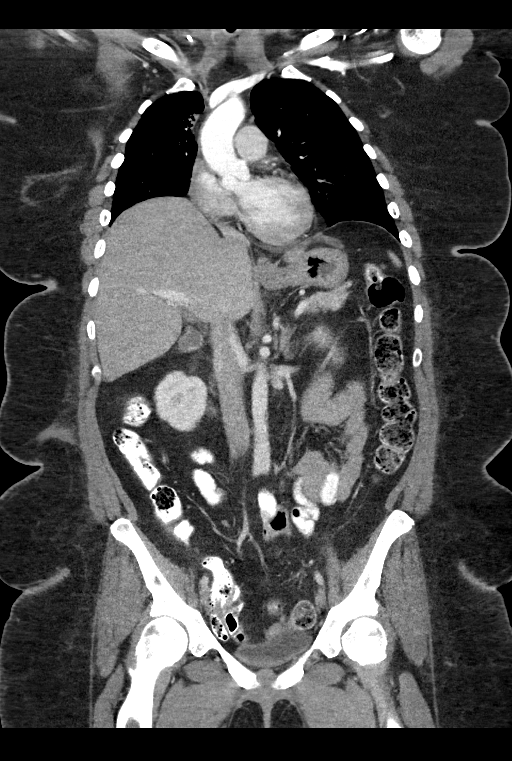
[im 67/121  soft-tissue]
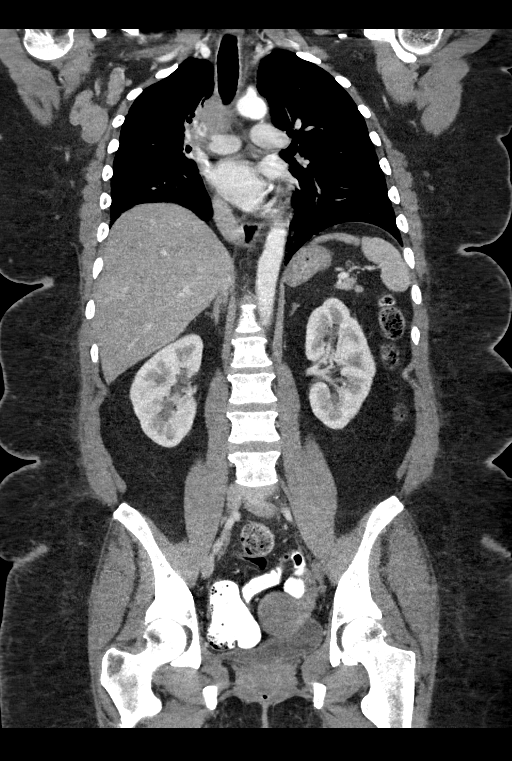

[14 of 46 positions shown; findings below may reference images not displayed]

FINDINGS: CT CHEST FINDINGS

Cardiovascular: Normal heart size. No pericardial effusion
identified. Mild aortic atherosclerosis.

Mediastinum/Nodes: Right paratracheal adenopathy measures 1.9 cm,
image [DATE]. Previously 1.8 cm. Right axillary adenopathy is again
noted. Dominant lymph node measures 2.3 cm short axis, image [DATE].
Unchanged from previous exam. Two new enlarging lymph nodes are
identified within the right axilla/retropectoral region. These
measure up to 1.3 cm, image [DATE]. No left-sided supraclavicular or
axillary adenopathy.

Lungs/Pleura: No pleural effusion. Paramediastinal fibrosis and
masslike architectural distortion involving the right lung is again
noted compatible with changes of external beam radiation. Underlying
hypermetabolic lesion within the right lower lobe is
indistinguishable from changes of external beam radiation. No new
pulmonary nodules identified.

Musculoskeletal: No chest wall mass or suspicious bone lesions
identified.

CT ABDOMEN PELVIS FINDINGS

Hepatobiliary: Focal area of low attenuation within segment 4b
adjacent to the falciform ligament is favored to represent focal
fatty deposition. This is similar to previous examinations. Multiple
stones are identified within the gallbladder which measure up to
cm, image 62/2. Fusiform dilatation of the CBD measures up to
cm.

Pancreas: Unremarkable. No pancreatic ductal dilatation or
surrounding inflammatory changes.

Spleen: Normal in size without focal abnormality.

Adrenals/Urinary Tract: Normal adrenal glands.

The kidneys are unremarkable. No mass or hydronephrosis. Urinary
bladder is unremarkable.

Stomach/Bowel: Stomach normal. Small bowel loops have a normal
course and caliber without obstruction. No pathologic dilatation of
the colon.

Vascular/Lymphatic: Aortic atherosclerosis. No aneurysm. No upper
abdominal adenopathy. Bulky right inguinal lymph node is again
noted. On today's examination this measures 6.0 x 3.9 cm, image
108/2. This has increased from 5.0 x 3.4 cm previously.

Reproductive: This is again identified which appears retroflexed. No
adnexal mass identified.

Other: No free fluid or fluid collections identified.

Musculoskeletal: No acute or significant osseous findings.
IMPRESSION: 1. There has been interval increase in number of enlarged of right
axillary nodes and increase in size of right inguinal adenopathy
compatible with progression of disease.
2. Stable appearance of the right lung with changes of external beam
radiation.
3.  Aortic Atherosclerosis (GKNOP-N30.0).
4. Gallstones.

## 2018-10-10 NOTE — Progress Notes (Signed)
HEMATOLOGY/ONCOLOGY CLINIC NOTE  Date of Service: 10/11/18     Patient Care Team: [name], FNP as PCP - General (Nurse Practitioner) Pixie Casino, MD as PCP - Cardiology (Cardiology)  CHIEF COMPLAINTS  F/u for continued management of metastatic lung adenocarcinoma  Diagnosis Metastatic Lung Adenocarcinoma with rt flank subcutaneous metastasis.  Current treatment:  Nivolumab q2 weeks Palliative RT to pain rt inguinal mass   PreviousTreatment  -Concurrent Chemo-radiation with Carboplatin/Taxol. Completed RT on 02/13/2016 and has then completed 2 cycles of carboplatin + taxol. -Started Maintenance Durvalumab from  05/24/2016 q2weeks, switched to Nivolumab q2weeks on 12/13/16 once metastatic disease noted -s/p palliative RT to rt sided Subcutaneous metastatic mass.    HISTORY OF PRESENTING ILLNESS: Plz see my previous note for details on initial presentation.  INTERVAL HISTORY:  Marilyn [name] returns today for followup of her metastatic lung cancer. The patient's last visit with [place] was on 10/03/2018. The pt reports that she is doing well overall.  The pt reports that her mom moved out and the pt feels that she has less familial support at this time. She does have help at home between her nurse who she sees daily and fixes the pt lunch and dinner, her god-son, daughter and ex-husband as well.   She notes that she has fallen during the last two days because she felt weak and dizzy. She notes that she caught herself both times and denies passing out. She notes that she has urinary incontinence at the times that she has fallen. She adds that she has felt more SOB after walking around recently. She has made sure to walk twice a day with the help of her nurse.  The pt has continued on PO Folic acid and her thyroid replacement. The pt notes that she has been eating very well.   The pt notes that her previous itching has improved after using more lotion. She has  also been using an antifungal ointment.  Lab results today (10/11/2018) of CBC w/diff is as follows: all values are WNL except for WBC at 12.1k, RBC at 2.81, HGB at 8.3, HCT at 25.5, ANC at 10.8k, Abs immature granulocytes at 0.17k. 10/11/18 CMP, TSH and Free T4 are pending  On review of systems, pt reports recent weakness and dizziness, eating very well, breathing well, and denies passing out, and any other symptoms.   MEDICAL HISTORY:  Past Medical History:  Diagnosis Date  . Arthritis   . Asthma   . Diabetes mellitus    Type II  . Hemorrhoids   . Hypercholesteremia   . Hypertension   . Hypothyroidism   . Metastatic lung cancer (metastasis from lung to other site) (HCC)    Lung, Mets to skin- right flank. CT angio 2019 suspicious for axillary, external iliac, right inguinal mets  . 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 NICM (nonischemic cardiomyopathy) ([place])    a. mild - EF 45-50% by echo 10/2016 but EF 52% by low risk nuc 11/2016. b. Echo 07/2017 - EF normal, mild AI, unremarkable echo otherwise.  . Obese   . Pneumonia 2017  . Sinus tachycardia    a. dates back to at least 2013, etiology unclear.    SURGICAL HISTORY: Past Surgical History:  Procedure Laterality Date  . CESAREAN SECTION    . COLONOSCOPY    . I/D  arm Right 2013  . IR FLUORO GUIDE PORT INSERTION RIGHT  02/18/2017  .  IR GENERIC HISTORICAL  01/08/2016   IR US GUIDE VASC ACCESS RIGHT 01/08/2016 [name], DO [facility]  . IR GENERIC HISTORICAL  01/08/2016   IR FLUORO GUIDE CV LINE RIGHT 01/08/2016 [name], DO [facility]  . IR THORACENTESIS ASP PLEURAL SPACE W/IMG GUIDE  05/25/2018  . IR US GUIDE VASC ACCESS RIGHT  02/18/2017  . MULTIPLE EXTRACTIONS WITH ALVEOLOPLASTY N/A 03/25/2017   Procedure: MULTIPLE EXTRACTION WITH ALVEOLOPLASTY (teeth #s four, six, seven, eight, nine, ten, eleven, one, twenty-three, twenty-four, twenty-five, twenty-six, twenty-nine, thirty);  Surgeon: [name], [name];  Location: [place];  Service: Oral Surgery;  Laterality: N/A;  . TUBAL LIGATION    . US guided core needle biopsy     of right lower neck/supraclavicular lymph nodes    SOCIAL HISTORY: Social History   Socioeconomic History  . Marital status: Divorced    Spouse name: Not on file  . Number of children: Not on file  . Years of education: Not on file  . Highest education level: Not on file  Occupational History  . Not on file  Social Needs  . Financial resource strain: Not on file  . Food insecurity    Worry: Not on file    Inability: Not on file  . Transportation needs    Medical: No    Non-medical: No  Tobacco Use  . Smoking status: Never Smoker  . Smokeless tobacco: Never Used  Substance and Sexual Activity  . Alcohol use: No  . Drug use: No  . Sexual activity: Not Currently  Lifestyle  . Physical activity    Days per week: Not on file    Minutes per session: Not on file  . Stress: Not on file  Relationships  . Social Herbalist on phone: Not on file    Gets together: Not on file    Attends religious service: Not on file    Active member of club or organization: Not on file    Attends meetings of clubs or organizations: Not on file    Relationship status: Not on file  . Intimate partner violence    Fear of current or ex partner: No    Emotionally abused: No    Physically abused: No    Forced sexual activity: No  Other Topics Concern  . Not on file  Social History Narrative   No bird or mold exposure. No recent travel.   11-23-17 Unable to ask abuse questions mother with her today.    FAMILY HISTORY: Family History  Problem Relation Age of Onset  . Heart failure Mother   . Hypertension Mother   . Cancer Neg Hx   . Rheumatologic disease Neg Hx     ALLERGIES:  has No Known Allergies.  MEDICATIONS:  Current Outpatient Medications  Medication Sig Dispense Refill  . ACCU-CHEK AVIVA PLUS test strip CHECK BLOOD SUGAR THREE TIMES A DAY  BEFORE MEALS DIRECTED    . amoxicillin-clavulanate (AUGMENTIN) 875-125 MG tablet Take 1 tablet by mouth 2 (two) times daily. (Patient not taking: Reported on 08/10/2018) 14 tablet 0  . ASPIRIN LOW DOSE 81 MG EC tablet TAKE 1 TABLET BY MOUTH EVERY DAY (Patient taking differently: Take 81 mg by mouth daily. Enteric Coated.  ** DO NOT CRUSH **) 60 tablet 2  . atorvastatin (LIPITOR) 40 MG tablet Take 1 tablet (40 mg total) by mouth daily at 6 PM. 30 tablet 0  . clotrimazole-betamethasone (LOTRISONE) cream Apply 1 application topically 2 (  two) times daily. To areas of skin rash 30 g 0  . dexamethasone (DECADRON) 4 MG tablet Take 1 tab two times a day the day before Alimta chemo. Take 2 tabs two times a day starting the day after chemo for 3 days. 30 tablet 1  . DULoxetine (CYMBALTA) 60 MG capsule TAKE 1 CAPSULE BY MOUTH EVERY DAY (Patient taking differently: Take 60 mg by mouth daily. ) 30 capsule 2  . fluconazole (DIFLUCAN) 200 MG tablet Take 1 tablet (200 mg total) by mouth daily. 1 tablet 0  . folic acid (FOLVITE) 1 MG tablet Take 1 tablet (1 mg total) by mouth daily. Start 5-7 days before Alimta chemotherapy. Continue until 21 days after Alimta completed. 100 tablet 3  . hydrOXYzine (ATARAX/VISTARIL) 10 MG tablet Take 1 tablet (10 mg total) by mouth 3 (three) times daily as needed for itching. 30 tablet 0  . insulin aspart (NOVOLOG) 100 UNIT/ML FlexPen As per sliding scale instructions (Patient taking differently: Inject 0-10 Units into the skin 3 (three) times daily. As per sliding scale instructions) 15 mL 2  . Insulin Degludec (TRESIBA FLEXTOUCH) 200 UNIT/ML SOPN Inject 100 Units into the skin daily before breakfast.     . levalbuterol (XOPENEX HFA) 45 MCG/ACT inhaler Inhale 1-2 puffs into the lungs every 8 (eight) hours as needed for wheezing. 1 Inhaler 12  . levothyroxine (SYNTHROID, LEVOTHROID) 150 MCG tablet TAKE 1 TABLET BY MOUTH EVERY DAY BEFORE BREAKFAST (Patient taking differently: Take 150 mcg  by mouth daily before breakfast. ) 30 tablet 2  . lidocaine-prilocaine (EMLA) cream Apply 1 application topically as needed. 30 g 6  . lidocaine-prilocaine (EMLA) cream Apply to affected area once 30 g 3  . lidocaine-prilocaine (EMLA) cream Apply to affected area once 30 g 3  . LORazepam (ATIVAN) 0.5 MG tablet Take 1 tablet (0.5 mg total) by mouth every 6 (six) hours as needed (Nausea or vomiting). 30 tablet 0  . metoprolol succinate (TOPROL-XL) 50 MG 24 hr tablet Take 1 tablet (39m) by mouth in the morning and 1/2 tablet (228m in the evening. Take with or immediately following a meal. 135 tablet 3  . naproxen (NAPROSYN) 500 MG tablet Take 1 tablet (500 mg total) by mouth 2 (two) times daily with a meal. (Patient not taking: Reported on 08/10/2018) 20 tablet 0  . nitrofurantoin, macrocrystal-monohydrate, (MACROBID) 100 MG capsule Take 1 capsule (100 mg total) by mouth every 12 (twelve) hours. (Patient not taking: Reported on 08/10/2018) 8 capsule 0  . omeprazole (PRILOSEC) 20 MG capsule Take by mouth daily.    . ondansetron (ZOFRAN) 8 MG tablet Take 1 tablet (8 mg total) by mouth 2 (two) times daily as needed for refractory nausea / vomiting. Start on day 3 after chemo. 30 tablet 1  . oxyCODONE-acetaminophen (PERCOCET) 5-325 MG tablet Take 1 tablet by mouth every 6 (six) hours as needed for moderate pain or severe pain. 50 tablet 0  . pregabalin (LYRICA) 50 MG capsule Take 50 mg by mouth 3 (three) times daily.    . prochlorperazine (COMPAZINE) 10 MG tablet Take 1 tablet (10 mg total) by mouth every 6 (six) hours as needed (Nausea or vomiting). 30 tablet 1  . senna-docusate (SENNA S) 8.6-50 MG tablet Take 2 tablets by mouth 2 (two) times daily. May reduce to 2 tab po HS as needed once regular BM estabished (Patient taking differently: Take 2 tablets by mouth 2 (two) times daily as needed (FOR CONSTIPATION). =) 60 tablet 1  .  traZODone (DESYREL) 50 MG tablet Take 1-2 tablets (50-100 mg total) by mouth at  bedtime as needed for sleep. (Patient not taking: Reported on 08/10/2018) 60 tablet 0  . Vitamins A & D (VITAMIN A & D) OINT Apply 1 application topically 2 (two) times daily. 1 Tube 3   No current facility-administered medications for this visit.    Facility-Administered Medications Ordered in Other Visits  Medication Dose Route Frequency Provider Last Rate Last Dose  . sodium chloride 0.9 % injection 10 mL  10 mL Intravenous PRN [name], MD   10 mL at 05/24/16 1252    REVIEW OF SYSTEMS:    A 10+ POINT REVIEW OF SYSTEMS WAS OBTAINED including neurology, dermatology, psychiatry, cardiac, respiratory, lymph, extremities, GI, GU, Musculoskeletal, constitutional, breasts, reproductive, HEENT.  All pertinent positives are noted in the HPI.  All others are negative.   PHYSICAL EXAMINATION: ECOG PERFORMANCE STATUS: 1 - Symptomatic but completely ambulatory  Vitals:   10/11/18 1408  BP: 120/79  Pulse: 95  Resp: 18  Temp: 98.9 F (37.2 C)  SpO2: 99%   Filed Weights   10/11/18 1408  Weight: 260 lb 12.8 oz (118.3 kg)   .Body mass index is 38.51 kg/m.  GENERAL:alert, in no acute distress and comfortable SKIN: eczematoid rash with some inflammatory papules and secondary hyperpigmentation EYES: conjunctiva are pink and non-injected, sclera anicteric OROPHARYNX: MMM, no exudates, no oropharyngeal erythema or ulceration NECK: supple, no JVD LYMPH:  no palpable lymphadenopathy in the cervical, axillary or inguinal regions LUNGS: clear to auscultation b/l with normal respiratory effort HEART: regular rate & rhythm ABDOMEN:  normoactive bowel sounds , non tender, not distended. No palpable hepatosplenomegaly.  Extremity: no pedal edema PSYCH: alert & oriented x 3 with fluent speech NEURO: no focal motor/sensory deficits    LABORATORY DATA: .  [name] CBC Latest Ref Rng & Units 10/11/2018 10/03/2018 09/20/2018  WBC 4.0 - 10.5 K/uL 12.1(H) 6.8 6.3  Hemoglobin 12.0 - 15.0 g/dL 8.3(L)  9.5(L) 10.6(L)  Hematocrit 36.0 - 46.0 % 25.5(L) 28.3(L) 32.2(L)  Platelets 150 - 400 K/uL 243 34(L) 261    . CMP Latest Ref Rng & Units 10/11/2018 10/03/2018 09/20/2018  Glucose 70 - 99 mg/dL 174(H) 153(H) 353(H)  BUN 6 - 20 mg/dL 21(H) 24(H) 10  Creatinine 0.44 - 1.00 mg/dL 0.96 1.16(H) 0.88  Sodium 135 - 145 mmol/L 140 142 135  Potassium 3.5 - 5.1 mmol/L 4.2 3.4(L) 4.3  Chloride 98 - 111 mmol/L 103 105 97(L)  CO2 22 - 32 mmol/L _0 Calcium 8.9 - 10.3 mg/dL 7.9(L) 7.8(L) 8.9  Total Protein 6.5 - 8.1 g/dL 7.2 6.8 7.9  Total Bilirubin 0.3 - 1.2 mg/dL <0.2(L) 0.2(L) 0.3  Alkaline Phos 38 - 126 U/L 132(H) 91 106  AST 15 - 41 U/L 15 10(L) 15  ALT 0 - 44 U/L _1 09/15/18 Pleural Fluid Cytology:             08/18/17 Bx:   08/16/17 Molecular Pathology:    RADIOGRAPHIC STUDIES:  .Dg Chest 1 View  Result Date: 09/15/2018 CLINICAL DATA:  S/P right thoracentesis that yielded1 liter of fluid. EXAM: CHEST  1 VIEW COMPARISON:  Chest CT 611 2020 FINDINGS: Reduction in volume of lower RIGHT pleural effusion. No pneumothorax. RIGHT perihilar density noted. LEFT lung clear IMPRESSION: No pneumothorax following thoracentesis.  Reduction in pleural fluid Electronically Signed   By: Suzy Bouchard M.D.   On: 09/15/2018 12:47  Ct Chest Wo Contrast  Result Date: 09/14/2018 CLINICAL DATA:  Follow-up lung cancer. EXAM: CT CHEST WITHOUT CONTRAST TECHNIQUE: Multidetector CT imaging of the chest was performed following the standard protocol without IV contrast. COMPARISON:  07/21/2018 FINDINGS: Cardiovascular: Heart size appears normal. No pericardial effusion. Mild aortic atherosclerosis. Mediastinum/Nodes: Normal appearance of the thyroid gland. The trachea appears patent and is midline. Normal appearance of the esophagus. Index right axillary lymph node measures 2.4 cm, image 48/2. Previously 2.2 cm. Index right axillary lymph node measures 1.7 cm, image 38/2. Previously 1.5 cm.  The index left supraclavicular lymph node measures 1.8 cm, image 11/2. Unchanged. The index right paratracheal lymph node measures 1.8 cm, image 54/2. Previously 1.6 cm. Lungs/Pleura: Moderate volume right pleural effusion is identified, stable to slightly decreased in size from previous exam. Paramediastinal radiation change within a geographic distribution in the right upper lobe with masslike architectural distortion in the right hilum is similar to previous study. No suspicious pulmonary nodules. Upper Abdomen: No acute abnormality. Multiples large stones within the gallbladder. Musculoskeletal: Right breast skin thickening is again noted. No suspicious bone lesion. IMPRESSION: 1. Persistent right axillary, mediastinal, and left supraclavicular adenopathy. Stable to mild increase in size of these lymph nodes period. 2. Stable to mild decrease in size of moderate size right pleural effusion. 3. Stable skin thickening of the right breast. Electronically Signed   By: Kerby Moors M.D.   On: 09/14/2018 14:48   US Thoracentesis Asp Pleural Space W/img Guide  Result Date: 09/15/2018 INDICATION: Patient with history of metastatic lung cancer, recurrent right pleural effusion, dyspnea. Request made for diagnostic and therapeutic right thoracentesis. EXAM: ULTRASOUND GUIDED DIAGNOSTIC AND THERAPEUTIC RIGHT THORACENTESIS MEDICATIONS: None COMPLICATIONS: None immediate. PROCEDURE: An ultrasound guided thoracentesis was thoroughly discussed with the patient and questions answered. The benefits, risks, alternatives and complications were also discussed. The patient understands and wishes to proceed with the procedure. Written consent was obtained. Ultrasound was performed to localize and mark an adequate pocket of fluid in the right chest. The area was then prepped and draped in the normal sterile fashion. 1% Lidocaine was used for local anesthesia. Under ultrasound guidance a 6 Fr Safe-T-Centesis catheter was  introduced. Thoracentesis was performed. The catheter was removed and a dressing applied. FINDINGS: A total of approximately 1 liter of hazy, amber fluid was removed. Samples were sent to the laboratory as requested by the clinical team. IMPRESSION: Successful ultrasound guided diagnostic and therapeutic right thoracentesis yielding 1 liter of pleural fluid. Read by: Rowe Robert, PA-C Electronically Signed   By: Aletta Edouard M.D.   On: 09/15/2018 13:09     ASSESSMENT & PLAN:   53 y.o.  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  -Noted to have a small mass in the left adrenal on CT but PET/CT neg for metastatic disease. Patient has been a lifelong nonsmoker -MRI of the brain was negative for any metastatic disease. At diagnosis. -High PDL1 expression (90%) on [facility] Neg for EGFR, ALK, ROS-1 and BRAF mutations. -Patient completed her planned definitive chemoradiation with Carbo Taxol on 02/13/2016. No prohibitive toxicities other than some grade 1 skin desquamation and some grade 1-2 radiation esophagitis. She has subsequently received 2 out of  2 planned dose of carboplatin + Taxol. -CTA chest 10/29/2016  no evidence of lung cancer progression in the chest. No PE -CT abd/pelvis-10/29/2016  Interval 5.0 x 5.0 x 4.7 cm mass in the right lateral subcutaneous fat at the level of the mid abdomen. This has CT features compatible with a metastasis or primary neoplasm. -CT C/A/P (05/12/2017): Interval development of right axillary, right external iliac and right inguinal adenopathy. Suspicious for nodal metastasis. 2. Decrease in size of right paratracheal adenopathy. 3. Decrease  in size and enhancement associated with right lateral body wall lesion. 4. New small nonspecific pulmonary nodule in the left lower lobe measuring 6 mm. 5. Stable appearance of changes secondary to external beam radiation within the right lung  08/03/17 PET which revealed 2.8 cm right axillary lymph node is markedly hypermetabolic and consistent with metastatic disease. Extensive radiation changes involving the right paramediastinal lung. There is a focus of hypermetabolism in the right lower lobe which is suspicious for residual or recurrent tumor.  11/11/17 CT C/A/P revealed There has been interval increase in number of enlarged of right axillary nodes and increase in size of right inguinal adenopathy compatible with progression of disease. Stable appearance of the right lung with changes of external beam radiation.    03/01/18 CT C/A/P revealed Bulky right subpectoral/axillary adenopathy, markedly progressive from 11/11/2017. Enlarging retrocrural lymph node. Low right paratracheal adenopathy is stable. 2. Interval resection of a right inguinal nodal mass. Stable right external iliac lymph node. 3. Cholelithiasis.  Stable common bile duct dilatation. 4.  Aortic atherosclerosis.  she received maintenance Durvalumab from  05/24/2016 q2weeks, switched to Nivolumab q2weeks on 12/13/16 in the setting of Biopsy proven isolated metastatic disease. Given the tumor is strongly PDL1 positive and patient has only isolated metastatic disease was treated with palliative RT to metastases with significant improvement. Molecular pathology from 515/19 did not reveal obviously targetable mutations with NF1 and TP53 mutations present.  05/24/18 CT Chest revealed Mixed interval change. 2. Moderate dependent right pleural effusion, significantly increased. 3. Right axillary adenopathy is decreased. 4. New left supraclavicular and left posterior mediastinal lymphadenopathy. Increased right retrocrural adenopathy. These findings are  worrisome for progression of metastatic nodal disease. 5. New mild patchy nodular consolidation in the peripheral right middle lobe, which could be inflammatory or neoplastic. 6. Similar finely nodular patchy thickening of the peripheral peribronchovascular interstitium and interlobular septa in the lower lungs bilaterally, nonspecific, lymphangitic tumor not excluded. 7. Nonspecific increased right breast skin thickening, possibly treatment related. Aortic Atherosclerosis.  05/30/18 CT Abdomen revealed Cholelithiasis, without associated inflammatory changes to suggest acute cholecystitis. Moderate right pleural effusion. Please note that the pelvis was not imaged.  07/21/18 CT Chest revealed "Progressive left supraclavicular lymphadenopathy. Right axillary, mediastinal and hilar adenopathy is grossly stable, suboptimally evaluated without contrast. These findings remain consistent with metastatic disease. 2. Slight enlargement of moderate-sized right pleural effusion with extension into the minor fissure. This limits evaluation of the previously demonstrated right middle lobe nodular density. This effusion could be malignant. No discretely enlarging pulmonary nodules identified. 3. Stable skin thickening in the right breast. 4. PET CT follow-up may be helpful to assess for hypermetabolism in the lymph nodes and right pleural space."  Pt was previously seen to have progression through immunotherapy treatment and have been holding Nivolumab  09/14/18 CT Chest revealed "Persistent right axillary, mediastinal, and left supraclavicular adenopathy. Stable to mild increase in size of these lymph nodes period. 2. Stable to mild decrease in size of moderate size  right pleural effusion. 3. Stable skin thickening of the right breast." S/p 09/15/18 Thoracentesis removed 1L of fluid 09/15/18 Cytology revealed malignant cells in the removed pleural fluid  Plan:  -Discussed pt labwork today, 10/11/2018; PLT normalized.  HGB lower at 8.3. WBC at 12.1k in setting of G-CSF support. -10/11/18 TSH and Free T4 are pending -Will order 2 units PRBCs for symptomatic anemia -Decreasing Carboplatin to AUC of 4 due to anemia and previously developed thrombocytopenia with C1, and with adding Avastin during C2. -The pt has no prohibitive toxicities from continuing C2D1 Carboplatin AUC of 4, Alimta and now Avastin, with G-CSF support at this time.  -BP currently well controlled. Discussed possible side effects of Avastin and asked pt to let me know if she develops any SOB or CP. -Continue PO Folic acid replacement -Recommend staying better hydrated and consuming orange juice, coconut water, bananas and other potassium rich foods -Advised that pt let me know if she develops worsened SOB for consideration of repeating thoracentesis -Continue salt and baking soda mouthwashes 4-5 times a day for 7 days following chemotherapy infusions -Beginning [place] Vitamin B12 every third cycle -Continue Lotrisone and recommended not scratching skin with fingernails, but with palms if she has to scratch -If rash is persistent through Lotrisone, might need dermatology referral -Will repeat scans after C4 -Plan C1 Carboplatin and Alimta and then adding Avastin with future cycles after initial tolerance is displayed. Recommend pursuing up to 4 cycles with G-CSF support given previous ctx exposure. Would then tentatively move to maintenance Alimta and Avastin. -Radiation-related skin changes with right breast ? -Advised crowd avoidance and infection prevention strategies -Continue with outpatient PT -Vitamin A&D ointment and Hydorxyzine for vaginal itching -Advised that pt continue follow up with her cardiologist -Follow up with PCP Selina Cooley, FNP for DM management  And management of hypothyroidism -Will see the pt back 10-12 days with toxicity check   #3 h/o radiation pneumonitis - no new symptoms. Does has some cough with exertion but this  has improved.  #4 Improving fatigue (grade 1) - improved fatigue with levothyroxine compliance with near normalization of FT4 levels.  - previously given TSH of 12 -- increased Levothyroxine from 120mg to 1544m  ---will likely need larger dose but in the setting of baseline tachycardia we are gradually increasing dose to avoid ppt cardiac arrhythmias.  TSH elevated due to medication non compliance. TSH 6.24, FT4 0.6  #5 normocytic anemia related to chemotherapy -resolving and stable  #6 Left chest wall pain due to costochondritis - mx with pain medications. CTA x 3 neg for PE. No significant pain at this time.  #7 Sinus tachycardia - on BB per cards , no PE on CTA x 3 , -Still having sinus tachycardia . Partly from deconditioning and anemia. Seeing Dr. HiDebara Pickettrom cardiology  -Continues to be on BB  #8 neoplasm related pain is currently controlled without significant medications. Clinically likely has sleep apnea . Would need to be careful with sedative medications . 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 -RTC with Dr KaleIrene Limboh labs in 12 days -Plz schedule C3 of treatment as ordered with labs and MD visit  I discussed the assessment and treatment plan with the patient. The patient was provided an opportunity to ask questions and all were answered. The patient agreed with the plan and demonstrated an understanding of the instructions.   The patient was advised to call back or seek an in-person evaluation if the symptoms worsen or if the condition fails to improve as anticipated.  The total time spent in the appt was 25 minutes and more than 50% was on  counseling and direct patient cares.   Sullivan Lone MD [place] AAHIVMS [facility] Hematology/Oncology Physician [facility]  (Office):       [phone] (Work cell):  [phone] (Fax):           [phone]  I, Baldwin Jamaica, am acting as a scribe for Dr. Sullivan Lone.   .I have reviewed the above documentation for accuracy and completeness, and I agree with the above. [name] MD

## 2018-10-11 ENCOUNTER — Inpatient Hospital Stay: Payer: Medicare Other | Attending: Hematology

## 2018-10-11 ENCOUNTER — Other Ambulatory Visit: Payer: Self-pay

## 2018-10-11 ENCOUNTER — Inpatient Hospital Stay: Payer: Medicare Other

## 2018-10-11 ENCOUNTER — Other Ambulatory Visit: Payer: Self-pay | Admitting: Hematology

## 2018-10-11 ENCOUNTER — Inpatient Hospital Stay (HOSPITAL_BASED_OUTPATIENT_CLINIC_OR_DEPARTMENT_OTHER): Payer: Medicare Other | Admitting: Hematology

## 2018-10-11 ENCOUNTER — Telehealth: Payer: Self-pay | Admitting: Hematology

## 2018-10-11 ENCOUNTER — Other Ambulatory Visit: Payer: Self-pay | Admitting: *Deleted

## 2018-10-11 VITALS — BP 120/79 | HR 95 | Temp 98.9°F | Resp 18 | Ht 69.0 in | Wt 260.8 lb

## 2018-10-11 DIAGNOSIS — Z5111 Encounter for antineoplastic chemotherapy: Secondary | ICD-10-CM | POA: Diagnosis not present

## 2018-10-11 DIAGNOSIS — T451X5A Adverse effect of antineoplastic and immunosuppressive drugs, initial encounter: Secondary | ICD-10-CM | POA: Diagnosis not present

## 2018-10-11 DIAGNOSIS — Z9114 Patient's other noncompliance with medication regimen: Secondary | ICD-10-CM

## 2018-10-11 DIAGNOSIS — R531 Weakness: Secondary | ICD-10-CM

## 2018-10-11 DIAGNOSIS — K8020 Calculus of gallbladder without cholecystitis without obstruction: Secondary | ICD-10-CM | POA: Insufficient documentation

## 2018-10-11 DIAGNOSIS — D6481 Anemia due to antineoplastic chemotherapy: Secondary | ICD-10-CM | POA: Insufficient documentation

## 2018-10-11 DIAGNOSIS — R946 Abnormal results of thyroid function studies: Secondary | ICD-10-CM

## 2018-10-11 DIAGNOSIS — R5383 Other fatigue: Secondary | ICD-10-CM | POA: Insufficient documentation

## 2018-10-11 DIAGNOSIS — C3491 Malignant neoplasm of unspecified part of right bronchus or lung: Secondary | ICD-10-CM

## 2018-10-11 DIAGNOSIS — Z9221 Personal history of antineoplastic chemotherapy: Secondary | ICD-10-CM

## 2018-10-11 DIAGNOSIS — R590 Localized enlarged lymph nodes: Secondary | ICD-10-CM

## 2018-10-11 DIAGNOSIS — M940 Chondrocostal junction syndrome [Tietze]: Secondary | ICD-10-CM

## 2018-10-11 DIAGNOSIS — E7800 Pure hypercholesterolemia, unspecified: Secondary | ICD-10-CM | POA: Diagnosis not present

## 2018-10-11 DIAGNOSIS — G4700 Insomnia, unspecified: Secondary | ICD-10-CM | POA: Insufficient documentation

## 2018-10-11 DIAGNOSIS — Z7689 Persons encountering health services in other specified circumstances: Secondary | ICD-10-CM | POA: Diagnosis not present

## 2018-10-11 DIAGNOSIS — Z95828 Presence of other vascular implants and grafts: Secondary | ICD-10-CM

## 2018-10-11 DIAGNOSIS — R42 Dizziness and giddiness: Secondary | ICD-10-CM | POA: Insufficient documentation

## 2018-10-11 DIAGNOSIS — R000 Tachycardia, unspecified: Secondary | ICD-10-CM

## 2018-10-11 DIAGNOSIS — I700 Atherosclerosis of aorta: Secondary | ICD-10-CM | POA: Insufficient documentation

## 2018-10-11 DIAGNOSIS — Z452 Encounter for adjustment and management of vascular access device: Secondary | ICD-10-CM

## 2018-10-11 DIAGNOSIS — J90 Pleural effusion, not elsewhere classified: Secondary | ICD-10-CM

## 2018-10-11 DIAGNOSIS — I10 Essential (primary) hypertension: Secondary | ICD-10-CM | POA: Insufficient documentation

## 2018-10-11 DIAGNOSIS — Z79899 Other long term (current) drug therapy: Secondary | ICD-10-CM

## 2018-10-11 DIAGNOSIS — G620 Drug-induced polyneuropathy: Secondary | ICD-10-CM | POA: Insufficient documentation

## 2018-10-11 DIAGNOSIS — E039 Hypothyroidism, unspecified: Secondary | ICD-10-CM

## 2018-10-11 DIAGNOSIS — G893 Neoplasm related pain (acute) (chronic): Secondary | ICD-10-CM

## 2018-10-11 DIAGNOSIS — C792 Secondary malignant neoplasm of skin: Secondary | ICD-10-CM | POA: Diagnosis not present

## 2018-10-11 DIAGNOSIS — E1165 Type 2 diabetes mellitus with hyperglycemia: Secondary | ICD-10-CM | POA: Insufficient documentation

## 2018-10-11 DIAGNOSIS — Z923 Personal history of irradiation: Secondary | ICD-10-CM

## 2018-10-11 DIAGNOSIS — R300 Dysuria: Secondary | ICD-10-CM | POA: Insufficient documentation

## 2018-10-11 DIAGNOSIS — J45909 Unspecified asthma, uncomplicated: Secondary | ICD-10-CM | POA: Diagnosis not present

## 2018-10-11 DIAGNOSIS — Z794 Long term (current) use of insulin: Secondary | ICD-10-CM

## 2018-10-11 DIAGNOSIS — C3490 Malignant neoplasm of unspecified part of unspecified bronchus or lung: Secondary | ICD-10-CM

## 2018-10-11 DIAGNOSIS — Z7189 Other specified counseling: Secondary | ICD-10-CM

## 2018-10-11 DIAGNOSIS — K208 Other esophagitis: Secondary | ICD-10-CM | POA: Diagnosis not present

## 2018-10-11 LAB — TOTAL PROTEIN, URINE DIPSTICK: Protein, ur: <30 mg/dL

## 2018-10-11 LAB — CBC WITH DIFFERENTIAL/PLATELET
Abs Immature Granulocytes: 0.17 10*3/uL — ABNORMAL HIGH (ref 0.00–0.07)
Basophils Absolute: 0 10*3/uL (ref 0.0–0.1)
Basophils Relative: 0 %
Eosinophils Absolute: 0 10*3/uL (ref 0.0–0.5)
Eosinophils Relative: 0 %
HCT: 25.5 % — ABNORMAL LOW (ref 36.0–46.0)
Hemoglobin: 8.3 g/dL — ABNORMAL LOW (ref 12.0–15.0)
Immature Granulocytes: 1 %
Lymphocytes Relative: 6 %
Lymphs Abs: 0.7 10*3/uL (ref 0.7–4.0)
MCH: 29.5 pg (ref 26.0–34.0)
MCHC: 32.5 g/dL (ref 30.0–36.0)
MCV: 90.7 fL (ref 80.0–100.0)
Monocytes Absolute: 0.5 10*3/uL (ref 0.1–1.0)
Monocytes Relative: 4 %
Neutro Abs: 10.8 10*3/uL — ABNORMAL HIGH (ref 1.7–7.7)
Neutrophils Relative %: 89 %
Platelets: 243 10*3/uL (ref 150–400)
RBC: 2.81 MIL/uL — ABNORMAL LOW (ref 3.87–5.11)
RDW: 11.9 % (ref 11.5–15.5)
WBC: 12.1 10*3/uL — ABNORMAL HIGH (ref 4.0–10.5)
nRBC: 0 % (ref 0.0–0.2)

## 2018-10-11 LAB — CMP (CANCER CENTER ONLY)
ALT: 25 U/L (ref 0–44)
AST: 15 U/L (ref 15–41)
Albumin: 2.9 g/dL — ABNORMAL LOW (ref 3.5–5.0)
Alkaline Phosphatase: 132 U/L — ABNORMAL HIGH (ref 38–126)
Anion gap: 10 (ref 5–15)
BUN: 21 mg/dL — ABNORMAL HIGH (ref 6–20)
CO2: 27 mmol/L (ref 22–32)
Calcium: 7.9 mg/dL — ABNORMAL LOW (ref 8.9–10.3)
Chloride: 103 mmol/L (ref 98–111)
Creatinine: 0.96 mg/dL (ref 0.44–1.00)
GFR, Est AFR Am: >60 mL/min (ref >60)
GFR, Estimated: >60 mL/min (ref >60)
Glucose, Bld: 174 mg/dL — ABNORMAL HIGH (ref 70–99)
Potassium: 4.2 mmol/L (ref 3.5–5.1)
Sodium: 140 mmol/L (ref 135–145)
Total Bilirubin: <0.2 mg/dL — ABNORMAL LOW (ref 0.3–1.2)
Total Protein: 7.2 g/dL (ref 6.5–8.1)

## 2018-10-11 LAB — T4, FREE: Free T4: 0.6 ng/dL — ABNORMAL LOW (ref 0.61–1.12)

## 2018-10-11 MED ORDER — SODIUM CHLORIDE 0.9% FLUSH
10.0000 mL | INTRAVENOUS | Status: DC | PRN
  Administered 2018-10-11: 10 mL via INTRAVENOUS
  Filled 2018-10-11: qty 10

## 2018-10-11 MED ORDER — FOLIC ACID 1 MG PO TABS: 5.0000 mg | ORAL_TABLET | Freq: Every day | ORAL | Status: DC

## 2018-10-11 MED ORDER — SODIUM CHLORIDE 0.9% FLUSH
10.0000 mL | INTRAVENOUS | Status: DC | PRN
  Administered 2018-10-11: 10 mL
  Filled 2018-10-11: qty 10

## 2018-10-11 MED ORDER — SODIUM CHLORIDE 0.9 % IV SOLN
600.0000 mg | Freq: Once | INTRAVENOUS | Status: AC
  Administered 2018-10-11: 600 mg via INTRAVENOUS
  Filled 2018-10-11: qty 60

## 2018-10-11 MED ORDER — SODIUM CHLORIDE 0.9 % IV SOLN
15.3000 mg/kg | Freq: Once | INTRAVENOUS | Status: AC
  Administered 2018-10-11: 1800 mg via INTRAVENOUS
  Filled 2018-10-11: qty 64

## 2018-10-11 MED ORDER — CYANOCOBALAMIN 1000 MCG/ML IJ SOLN
INTRAMUSCULAR | Status: AC
  Filled 2018-10-11: qty 1

## 2018-10-11 MED ORDER — HEPARIN SOD (PORK) LOCK FLUSH 100 UNIT/ML IV SOLN
500.0000 [IU] | Freq: Once | INTRAVENOUS | Status: AC | PRN
  Administered 2018-10-11: 500 [IU]
  Filled 2018-10-11: qty 5

## 2018-10-11 MED ORDER — SODIUM CHLORIDE 0.9 % IV SOLN
Freq: Once | INTRAVENOUS | Status: AC
  Administered 2018-10-11: 15:00:00 via INTRAVENOUS
  Filled 2018-10-11: qty 250

## 2018-10-11 MED ORDER — PALONOSETRON HCL INJECTION 0.25 MG/5ML
INTRAVENOUS | Status: AC
  Filled 2018-10-11: qty 5

## 2018-10-11 MED ORDER — CYANOCOBALAMIN 1000 MCG/ML IJ SOLN
1000.0000 ug | Freq: Once | INTRAMUSCULAR | Status: AC
  Administered 2018-10-11: 1000 ug via SUBCUTANEOUS

## 2018-10-11 MED ORDER — DIPHENHYDRAMINE HCL 50 MG/ML IJ SOLN
25.0000 mg | Freq: Once | INTRAMUSCULAR | Status: AC
  Administered 2018-10-11: 25 mg via INTRAVENOUS

## 2018-10-11 MED ORDER — FAMOTIDINE IN NACL 20-0.9 MG/50ML-% IV SOLN
INTRAVENOUS | Status: AC
  Filled 2018-10-11: qty 50

## 2018-10-11 MED ORDER — PALONOSETRON HCL INJECTION 0.25 MG/5ML
0.2500 mg | Freq: Once | INTRAVENOUS | Status: AC
  Administered 2018-10-11: 0.25 mg via INTRAVENOUS

## 2018-10-11 MED ORDER — DIPHENHYDRAMINE HCL 50 MG/ML IJ SOLN
INTRAMUSCULAR | Status: AC
  Filled 2018-10-11: qty 1

## 2018-10-11 MED ORDER — SODIUM CHLORIDE 0.9 % IV SOLN
Freq: Once | INTRAVENOUS | Status: AC
  Administered 2018-10-11: 16:00:00 via INTRAVENOUS
  Filled 2018-10-11: qty 5

## 2018-10-11 MED ORDER — FAMOTIDINE IN NACL 20-0.9 MG/50ML-% IV SOLN
20.0000 mg | Freq: Once | INTRAVENOUS | Status: AC
  Administered 2018-10-11: 20 mg via INTRAVENOUS

## 2018-10-11 MED ORDER — SODIUM CHLORIDE 0.9 % IV SOLN
500.0000 mg/m2 | Freq: Once | INTRAVENOUS | Status: AC
  Administered 2018-10-11: 1200 mg via INTRAVENOUS
  Filled 2018-10-11: qty 40

## 2018-10-11 NOTE — Telephone Encounter (Signed)
Scheduled appt per 7/8 los. Spoke with patient and she is aware of her appt date and time.

## 2018-10-11 NOTE — Progress Notes (Signed)
Verbal Order per Dr. Irene Limbo: Urine Protein dipstick ordered prior to beginning treatment today, but so not have to wait for results to begin treatment.

## 2018-10-11 NOTE — Patient Instructions (Signed)
[facility] Discharge Instructions for Patients Receiving Chemotherapy  Today you received the following chemotherapy agents: Alimta, Carboplatin, and Immunotherapy: Avastin  To help prevent nausea and vomiting after your treatment, we encourage you to take your nausea medication as directed by your MD.   If you develop nausea and vomiting that is not controlled by your nausea medication, call the clinic.   BELOW ARE SYMPTOMS THAT SHOULD BE REPORTED IMMEDIATELY:  *FEVER GREATER THAN 100.5 F  *CHILLS WITH OR WITHOUT FEVER  NAUSEA AND VOMITING THAT IS NOT CONTROLLED WITH YOUR NAUSEA MEDICATION  *UNUSUAL SHORTNESS OF BREATH  *UNUSUAL BRUISING OR BLEEDING  TENDERNESS IN MOUTH AND THROAT WITH OR WITHOUT PRESENCE OF ULCERS  *URINARY PROBLEMS  *BOWEL PROBLEMS  UNUSUAL RASH Items with * indicate a potential emergency and should be followed up as soon as possible.  Feel free to call the clinic should you have any questions or concerns. The clinic phone number is [phone].  Please show the [facility] at check-in to the Emergency Department and triage nurse.  Bevacizumab injection What is this medicine? BEVACIZUMAB (be va SIZ yoo mab) is a monoclonal antibody. It is used to treat many types of cancer. This medicine may be used for other purposes; ask your health care provider or pharmacist if you have questions. COMMON BRAND NAME(S): Avastin, MVASI, Zirabev What should I tell my health care provider before I take this medicine? They need to know if you have any of these conditions:  diabetes  heart disease  high blood pressure  history of coughing up blood  prior anthracycline chemotherapy (e.g., doxorubicin, daunorubicin, epirubicin)  recent or ongoing radiation therapy  recent or planning to have surgery  stroke  an unusual or allergic reaction to bevacizumab, hamster proteins, mouse proteins, other medicines, foods, dyes, or  preservatives  pregnant or trying to get pregnant  breast-feeding How should I use this medicine? This medicine is for infusion into a vein. It is given by a health care professional in a hospital or clinic setting. Talk to your pediatrician regarding the use of this medicine in children. Special care may be needed. Overdosage: If you think you have taken too much of this medicine contact a poison control center or emergency room at once. NOTE: This medicine is only for you. Do not share this medicine with others. What if I miss a dose? It is important not to miss your dose. Call your doctor or health care professional if you are unable to keep an appointment. What may interact with this medicine? Interactions are not expected. This list may not describe all possible interactions. Give your health care provider a list of all the medicines, herbs, non-prescription drugs, or dietary supplements you use. Also tell them if you smoke, drink alcohol, or use illegal drugs. Some items may interact with your medicine. What should I watch for while using this medicine? Your condition will be monitored carefully while you are receiving this medicine. You will need important blood work and urine testing done while you are taking this medicine. This medicine may increase your risk to bruise or bleed. Call your doctor or health care professional if you notice any unusual bleeding. This medicine should be started at least 28 days following major surgery and the site of the surgery should be totally healed. Check with your doctor before scheduling dental work or surgery while you are receiving this treatment. Talk to your doctor if you have recently had surgery or if  you have a wound that has not healed. Do not become pregnant while taking this medicine or for 6 months after stopping it. Women should inform their doctor if they wish to become pregnant or think they might be pregnant. There is a potential for  serious side effects to an unborn child. Talk to your health care professional or pharmacist for more information. Do not breast-feed an infant while taking this medicine and for 6 months after the last dose. This medicine has caused ovarian failure in some women. This medicine may interfere with the ability to have a child. You should talk to your doctor or health care professional if you are concerned about your fertility. What side effects may I notice from receiving this medicine? Side effects that you should report to your doctor or health care professional as soon as possible:  allergic reactions like skin rash, itching or hives, swelling of the face, lips, or tongue  chest pain or chest tightness  chills  coughing up blood  high fever  seizures  severe constipation  signs and symptoms of bleeding such as bloody or black, tarry stools; red or dark-brown urine; spitting up blood or brown material that looks like coffee grounds; red spots on the skin; unusual bruising or bleeding from the eye, gums, or nose  signs and symptoms of a blood clot such as breathing problems; chest pain; severe, sudden headache; pain, swelling, warmth in the leg  signs and symptoms of a stroke like changes in vision; confusion; trouble speaking or understanding; severe headaches; sudden numbness or weakness of the face, arm or leg; trouble walking; dizziness; loss of balance or coordination  stomach pain  sweating  swelling of legs or ankles  vomiting  weight gain Side effects that usually do not require medical attention (report to your doctor or health care professional if they continue or are bothersome):  back pain  changes in taste  decreased appetite  dry skin  nausea  tiredness This list may not describe all possible side effects. Call your doctor for medical advice about side effects. You may report side effects to FDA at 1-800-FDA-1088. Where should I keep my medicine? This  drug is given in a hospital or clinic and will not be stored at home. NOTE: This sheet is a summary. It may not cover all possible information. If you have questions about this medicine, talk to your doctor, pharmacist, or health care provider.  2020 Elsevier/Gold Standard (2016-03-19 14:33:29)  Coronavirus (COVID-19) Are you at risk?  Are you at risk for the Coronavirus (COVID-19)?  To be considered HIGH RISK for Coronavirus (COVID-19), you have to meet the following criteria:  . Traveled to [place], [place], [place], [place] or [place]; or in the [place] to [place], [place], [place], or [place]; and have fever, cough, and shortness of breath within the last 2 weeks of travel OR . Been in close contact with a person diagnosed with COVID-19 within the last 2 weeks and have fever, cough, and shortness of breath . IF YOU DO NOT MEET THESE CRITERIA, YOU ARE CONSIDERED LOW RISK FOR COVID-19.  What to do if you are HIGH RISK for COVID-19?  [name] If you are having a medical emergency, call 911. . Seek medical care right away. Before you go to a doctor's office, urgent care or emergency department, call ahead and tell them about your recent travel, contact with someone diagnosed with COVID-19, and your symptoms. You should receive instructions from your  physician's office regarding next steps of care.  . When you arrive at healthcare provider, tell the healthcare staff immediately you have returned from visiting [place], [place], [place], [place] or [place]; or traveled in the [place] to [place], [place], [place], or [place]; in the last two weeks or you have been in close contact with a person diagnosed with COVID-19 in the last 2 weeks.   . Tell the health care staff about your symptoms: fever, cough and shortness of breath. . After you have been seen by a medical provider, you will be either: o Tested for (COVID-19) and discharged home on quarantine except to seek medical care  if symptoms worsen, and asked to  - Stay home and avoid contact with others until you get your results (4-5 days)  - Avoid travel on public transportation if possible (such as bus, train, or airplane) or o Sent to the Emergency Department by EMS for evaluation, COVID-19 testing, and possible admission depending on your condition and test results.  What to do if you are LOW RISK for COVID-19?  Reduce your risk of any infection by using the same precautions used for avoiding the common cold or flu:  [name] Wash your hands often with soap and warm water for at least 20 seconds.  If soap and water are not readily available, use an alcohol-based hand sanitizer with at least 60% alcohol.  . If coughing or sneezing, cover your mouth and nose by coughing or sneezing into the elbow areas of your shirt or coat, into a tissue or into your sleeve (not your hands). . Avoid shaking hands with others and consider head nods or verbal greetings only. . Avoid touching your eyes, nose, or mouth with unwashed hands.  . Avoid close contact with people who are sick. . Avoid places or events with large numbers of people in one location, like concerts or sporting events. . Carefully consider travel plans you have or are making. . If you are planning any travel outside or inside the [place], visit the CDC's Travelers' Health webpage for the latest health notices. . If you have some symptoms but not all symptoms, continue to monitor at home and seek medical attention if your symptoms worsen. . If you are having a medical emergency, call 911.   [place] / e-Visit: [email or web address]         [facility]: [facility]: [phone]                   [facility]: [phone]

## 2018-10-11 NOTE — Progress Notes (Unsigned)
tsh

## 2018-10-12 ENCOUNTER — Other Ambulatory Visit: Payer: Self-pay

## 2018-10-12 DIAGNOSIS — D509 Iron deficiency anemia, unspecified: Secondary | ICD-10-CM

## 2018-10-12 LAB — TSH: TSH: 6.242 u[IU]/mL — ABNORMAL HIGH (ref 0.308–3.960)

## 2018-10-13 ENCOUNTER — Ambulatory Visit: Payer: Medicare Other

## 2018-10-13 ENCOUNTER — Inpatient Hospital Stay: Payer: Medicare Other

## 2018-10-13 ENCOUNTER — Other Ambulatory Visit: Payer: Self-pay

## 2018-10-13 ENCOUNTER — Other Ambulatory Visit: Payer: Self-pay | Admitting: *Deleted

## 2018-10-13 VITALS — BP 143/86 | HR 78 | Temp 98.2°F | Resp 17

## 2018-10-13 DIAGNOSIS — E039 Hypothyroidism, unspecified: Secondary | ICD-10-CM

## 2018-10-13 DIAGNOSIS — Z7689 Persons encountering health services in other specified circumstances: Secondary | ICD-10-CM | POA: Diagnosis not present

## 2018-10-13 DIAGNOSIS — Z9114 Patient's other noncompliance with medication regimen: Secondary | ICD-10-CM | POA: Diagnosis not present

## 2018-10-13 DIAGNOSIS — Z5111 Encounter for antineoplastic chemotherapy: Secondary | ICD-10-CM | POA: Diagnosis not present

## 2018-10-13 DIAGNOSIS — Z7189 Other specified counseling: Secondary | ICD-10-CM

## 2018-10-13 DIAGNOSIS — R590 Localized enlarged lymph nodes: Secondary | ICD-10-CM | POA: Diagnosis not present

## 2018-10-13 DIAGNOSIS — D509 Iron deficiency anemia, unspecified: Secondary | ICD-10-CM

## 2018-10-13 DIAGNOSIS — C3491 Malignant neoplasm of unspecified part of right bronchus or lung: Secondary | ICD-10-CM | POA: Diagnosis not present

## 2018-10-13 DIAGNOSIS — C792 Secondary malignant neoplasm of skin: Secondary | ICD-10-CM | POA: Diagnosis not present

## 2018-10-13 LAB — PREPARE RBC (CROSSMATCH)

## 2018-10-13 LAB — ABO/RH: ABO/RH(D): A POS

## 2018-10-13 LAB — TSH: TSH: 25.209 u[IU]/mL — ABNORMAL HIGH (ref 0.308–3.960)

## 2018-10-13 MED ORDER — ACETAMINOPHEN 325 MG PO TABS
650.0000 mg | ORAL_TABLET | Freq: Once | ORAL | Status: AC
  Administered 2018-10-13: 650 mg via ORAL

## 2018-10-13 MED ORDER — DIPHENHYDRAMINE HCL 25 MG PO CAPS
25.0000 mg | ORAL_CAPSULE | Freq: Once | ORAL | Status: AC
  Administered 2018-10-13: 25 mg via ORAL

## 2018-10-13 MED ORDER — SODIUM CHLORIDE 0.9% IV SOLUTION
250.0000 mL | Freq: Once | INTRAVENOUS | Status: AC
  Administered 2018-10-13: 250 mL via INTRAVENOUS
  Filled 2018-10-13: qty 250

## 2018-10-13 MED ORDER — HEPARIN SOD (PORK) LOCK FLUSH 100 UNIT/ML IV SOLN
500.0000 [IU] | Freq: Every day | INTRAVENOUS | Status: AC | PRN
  Administered 2018-10-13: 500 [IU]
  Filled 2018-10-13: qty 5

## 2018-10-13 MED ORDER — SODIUM CHLORIDE 0.9% FLUSH
10.0000 mL | INTRAVENOUS | Status: AC | PRN
  Administered 2018-10-13: 10 mL
  Filled 2018-10-13: qty 10

## 2018-10-13 MED ORDER — DIPHENHYDRAMINE HCL 25 MG PO CAPS
ORAL_CAPSULE | ORAL | Status: AC
  Filled 2018-10-13: qty 1

## 2018-10-13 MED ORDER — ACETAMINOPHEN 325 MG PO TABS
ORAL_TABLET | ORAL | Status: AC
  Filled 2018-10-13: qty 2

## 2018-10-13 MED ORDER — PEGFILGRASTIM-CBQV 6 MG/0.6ML ~~LOC~~ SOSY
6.0000 mg | PREFILLED_SYRINGE | Freq: Once | SUBCUTANEOUS | Status: AC
  Administered 2018-10-13: 6 mg via SUBCUTANEOUS

## 2018-10-13 MED ORDER — PEGFILGRASTIM-CBQV 6 MG/0.6ML ~~LOC~~ SOSY
PREFILLED_SYRINGE | SUBCUTANEOUS | Status: AC
  Filled 2018-10-13: qty 0.6

## 2018-10-13 NOTE — Patient Instructions (Signed)
Blood Transfusion, Adult, Care After This sheet gives you information about how to care for yourself after your procedure. Your doctor may also give you more specific instructions. If you have problems or questions, contact your doctor. Follow these instructions at home:   Take over-the-counter and prescription medicines only as told by your doctor.  Go back to your normal activities as told by your doctor.  Follow instructions from your doctor about how to take care of the area where an IV tube was put into your vein (insertion site). Make sure you: ? Wash your hands with soap and water before you change your bandage (dressing). If there is no soap and water, use hand sanitizer. ? Change your bandage as told by your doctor.  Check your IV insertion site every day for signs of infection. Check for: ? More redness, swelling, or pain. ? More fluid or blood. ? Warmth. ? Pus or a bad smell. Contact a doctor if:  You have more redness, swelling, or pain around the IV insertion site.  You have more fluid or blood coming from the IV insertion site.  Your IV insertion site feels warm to the touch.  You have pus or a bad smell coming from the IV insertion site.  Your pee (urine) turns pink, red, or brown.  You feel weak after doing your normal activities. Get help right away if:  You have signs of a serious allergic or body defense (immune) system reaction, including: ? Itchiness. ? Hives. ? Trouble breathing. ? Anxiety. ? Pain in your chest or lower back. ? Fever, flushing, and chills. ? Fast pulse. ? Rash. ? Watery poop (diarrhea). ? Throwing up (vomiting). ? Dark pee. ? Serious headache. ? Dizziness. ? Stiff neck. ? Yellow color in your face or the white parts of your eyes (jaundice). Summary  After a blood transfusion, return to your normal activities as told by your doctor.  Every day, check for signs of infection where the IV tube was put into your vein.  Some  signs of infection are warm skin, more redness and pain, more fluid or blood, and pus or a bad smell where the needle went in.  Contact your doctor if you feel weak or have any unusual symptoms. This information is not intended to replace advice given to you by your health care provider. Make sure you discuss any questions you have with your health care provider. Document Released: 04/12/2014 Document Revised: 07/27/2017 Document Reviewed: 11/14/2015 Elsevier Patient Education  [place]. Pegfilgrastim injection What is this medicine? PEGFILGRASTIM (PEG fil gra stim) is a long-acting granulocyte colony-stimulating factor that stimulates the growth of neutrophils, a type of white blood cell important in the body's fight against infection. It is used to reduce the incidence of fever and infection in patients with certain types of cancer who are receiving chemotherapy that affects the bone marrow, and to increase survival after being exposed to high doses of radiation. This medicine may be used for other purposes; ask your health care provider or pharmacist if you have questions. COMMON BRAND NAME(S): Steve Rattler, Ziextenzo What should I tell my health care provider before I take this medicine? They need to know if you have any of these conditions:  kidney disease  latex allergy  ongoing radiation therapy  sickle cell disease  skin reactions to acrylic adhesives (On-Body Injector only)  an unusual or allergic reaction to pegfilgrastim, filgrastim, other medicines, foods, dyes, or preservatives  pregnant or trying to  get pregnant  breast-feeding How should I use this medicine? This medicine is for injection under the skin. If you get this medicine at home, you will be taught how to prepare and give the pre-filled syringe or how to use the On-body Injector. Refer to the patient Instructions for Use for detailed instructions. Use exactly as directed. Tell your  healthcare provider immediately if you suspect that the On-body Injector may not have performed as intended or if you suspect the use of the On-body Injector resulted in a missed or partial dose. It is important that you put your used needles and syringes in a special sharps container. Do not put them in a trash can. If you do not have a sharps container, call your pharmacist or healthcare provider to get one. Talk to your pediatrician regarding the use of this medicine in children. While this drug may be prescribed for selected conditions, precautions do apply. Overdosage: If you think you have taken too much of this medicine contact a poison control center or emergency room at once. NOTE: This medicine is only for you. Do not share this medicine with others. What if I miss a dose? It is important not to miss your dose. Call your doctor or health care professional if you miss your dose. If you miss a dose due to an On-body Injector failure or leakage, a new dose should be administered as soon as possible using a single prefilled syringe for manual use. What may interact with this medicine? Interactions have not been studied. Give your health care provider a list of all the medicines, herbs, non-prescription drugs, or dietary supplements you use. Also tell them if you smoke, drink alcohol, or use illegal drugs. Some items may interact with your medicine. This list may not describe all possible interactions. Give your health care provider a list of all the medicines, herbs, non-prescription drugs, or dietary supplements you use. Also tell them if you smoke, drink alcohol, or use illegal drugs. Some items may interact with your medicine. What should I watch for while using this medicine? You may need blood work done while you are taking this medicine. If you are going to need a MRI, CT scan, or other procedure, tell your doctor that you are using this medicine (On-Body Injector only). What side effects  may I notice from receiving this medicine? Side effects that you should report to your doctor or health care professional as soon as possible:  allergic reactions like skin rash, itching or hives, swelling of the face, lips, or tongue  back pain  dizziness  fever  pain, redness, or irritation at site where injected  pinpoint red spots on the skin  red or dark-brown urine  shortness of breath or breathing problems  stomach or side pain, or pain at the shoulder  swelling  tiredness  trouble passing urine or change in the amount of urine Side effects that usually do not require medical attention (report to your doctor or health care professional if they continue or are bothersome):  bone pain  muscle pain This list may not describe all possible side effects. Call your doctor for medical advice about side effects. You may report side effects to FDA at 1-800-FDA-1088. Where should I keep my medicine? Keep out of the reach of children. If you are using this medicine at home, you will be instructed on how to store it. Throw away any unused medicine after the expiration date on the label. NOTE: This sheet is a  summary. It may not cover all possible information. If you have questions about this medicine, talk to your doctor, pharmacist, or health care provider.  2020 Elsevier/Gold Standard (2017-06-27 16:57:08)

## 2018-10-15 LAB — TYPE AND SCREEN
ABO/RH(D): A POS
Antibody Screen: NEGATIVE
Unit division: 0

## 2018-10-15 LAB — BPAM RBC
Blood Product Expiration Date: 202007182359
ISSUE DATE / TIME: 202007100945
Unit Type and Rh: 6200

## 2018-10-17 ENCOUNTER — Telehealth: Payer: Self-pay | Admitting: *Deleted

## 2018-10-17 NOTE — Telephone Encounter (Signed)
Patient called - states constipated. On Saturday, she states her daughter had to remove the stool manually. Now experiencing some bleeding at rectum and is afraid to have BM, so has been holding it.  Advised her per Dr. Irene Limbo: can use OTC hemorrhoid creme for pain.  Recommend OTC Magnesium Citrate if unable to pass any stool at this time, following directions on bottle. Recommended she start on OTC Miralax daily for continued management of constipation. She verbalized understanding and will contact office if not better.

## 2018-10-23 ENCOUNTER — Other Ambulatory Visit: Payer: Self-pay

## 2018-10-23 ENCOUNTER — Inpatient Hospital Stay: Payer: Medicare Other

## 2018-10-23 ENCOUNTER — Other Ambulatory Visit: Payer: Medicare Other

## 2018-10-23 ENCOUNTER — Inpatient Hospital Stay (HOSPITAL_BASED_OUTPATIENT_CLINIC_OR_DEPARTMENT_OTHER): Payer: Medicare Other | Admitting: Hematology

## 2018-10-23 ENCOUNTER — Telehealth: Payer: Self-pay | Admitting: *Deleted

## 2018-10-23 DIAGNOSIS — R300 Dysuria: Secondary | ICD-10-CM

## 2018-10-23 DIAGNOSIS — J45909 Unspecified asthma, uncomplicated: Secondary | ICD-10-CM

## 2018-10-23 DIAGNOSIS — Z79899 Other long term (current) drug therapy: Secondary | ICD-10-CM

## 2018-10-23 DIAGNOSIS — J90 Pleural effusion, not elsewhere classified: Secondary | ICD-10-CM

## 2018-10-23 DIAGNOSIS — R590 Localized enlarged lymph nodes: Secondary | ICD-10-CM

## 2018-10-23 DIAGNOSIS — C792 Secondary malignant neoplasm of skin: Secondary | ICD-10-CM

## 2018-10-23 DIAGNOSIS — E1165 Type 2 diabetes mellitus with hyperglycemia: Secondary | ICD-10-CM | POA: Diagnosis not present

## 2018-10-23 DIAGNOSIS — G4700 Insomnia, unspecified: Secondary | ICD-10-CM

## 2018-10-23 DIAGNOSIS — M940 Chondrocostal junction syndrome [Tietze]: Secondary | ICD-10-CM | POA: Diagnosis not present

## 2018-10-23 DIAGNOSIS — I10 Essential (primary) hypertension: Secondary | ICD-10-CM

## 2018-10-23 DIAGNOSIS — Z794 Long term (current) use of insulin: Secondary | ICD-10-CM

## 2018-10-23 DIAGNOSIS — E7800 Pure hypercholesterolemia, unspecified: Secondary | ICD-10-CM

## 2018-10-23 DIAGNOSIS — K8020 Calculus of gallbladder without cholecystitis without obstruction: Secondary | ICD-10-CM

## 2018-10-23 DIAGNOSIS — R531 Weakness: Secondary | ICD-10-CM

## 2018-10-23 DIAGNOSIS — C3491 Malignant neoplasm of unspecified part of right bronchus or lung: Secondary | ICD-10-CM

## 2018-10-23 DIAGNOSIS — G893 Neoplasm related pain (acute) (chronic): Secondary | ICD-10-CM | POA: Diagnosis not present

## 2018-10-23 DIAGNOSIS — Z9221 Personal history of antineoplastic chemotherapy: Secondary | ICD-10-CM

## 2018-10-23 DIAGNOSIS — R946 Abnormal results of thyroid function studies: Secondary | ICD-10-CM | POA: Diagnosis not present

## 2018-10-23 DIAGNOSIS — R000 Tachycardia, unspecified: Secondary | ICD-10-CM | POA: Diagnosis not present

## 2018-10-23 DIAGNOSIS — E661 Drug-induced obesity: Secondary | ICD-10-CM

## 2018-10-23 DIAGNOSIS — Z7689 Persons encountering health services in other specified circumstances: Secondary | ICD-10-CM | POA: Diagnosis not present

## 2018-10-23 DIAGNOSIS — R5383 Other fatigue: Secondary | ICD-10-CM

## 2018-10-23 DIAGNOSIS — I700 Atherosclerosis of aorta: Secondary | ICD-10-CM

## 2018-10-23 DIAGNOSIS — D6481 Anemia due to antineoplastic chemotherapy: Secondary | ICD-10-CM

## 2018-10-23 DIAGNOSIS — G620 Drug-induced polyneuropathy: Secondary | ICD-10-CM

## 2018-10-23 DIAGNOSIS — Z5111 Encounter for antineoplastic chemotherapy: Secondary | ICD-10-CM | POA: Diagnosis not present

## 2018-10-23 DIAGNOSIS — Z923 Personal history of irradiation: Secondary | ICD-10-CM

## 2018-10-23 DIAGNOSIS — Z95828 Presence of other vascular implants and grafts: Secondary | ICD-10-CM

## 2018-10-23 DIAGNOSIS — T451X5A Adverse effect of antineoplastic and immunosuppressive drugs, initial encounter: Secondary | ICD-10-CM | POA: Diagnosis not present

## 2018-10-23 DIAGNOSIS — Z9114 Patient's other noncompliance with medication regimen: Secondary | ICD-10-CM

## 2018-10-23 DIAGNOSIS — Z452 Encounter for adjustment and management of vascular access device: Secondary | ICD-10-CM

## 2018-10-23 DIAGNOSIS — K208 Other esophagitis: Secondary | ICD-10-CM

## 2018-10-23 DIAGNOSIS — Z7189 Other specified counseling: Secondary | ICD-10-CM

## 2018-10-23 DIAGNOSIS — R42 Dizziness and giddiness: Secondary | ICD-10-CM

## 2018-10-23 DIAGNOSIS — E039 Hypothyroidism, unspecified: Secondary | ICD-10-CM

## 2018-10-23 LAB — CMP (CANCER CENTER ONLY)
ALT: 72 U/L — ABNORMAL HIGH (ref 0–44)
AST: 46 U/L — ABNORMAL HIGH (ref 15–41)
Albumin: 3 g/dL — ABNORMAL LOW (ref 3.5–5.0)
Alkaline Phosphatase: 125 U/L (ref 38–126)
Anion gap: 11 (ref 5–15)
BUN: 8 mg/dL (ref 6–20)
CO2: 26 mmol/L (ref 22–32)
Calcium: 8 mg/dL — ABNORMAL LOW (ref 8.9–10.3)
Chloride: 105 mmol/L (ref 98–111)
Creatinine: 0.96 mg/dL (ref 0.44–1.00)
GFR, Est AFR Am: >60 mL/min (ref >60)
GFR, Estimated: >60 mL/min (ref >60)
Glucose, Bld: 189 mg/dL — ABNORMAL HIGH (ref 70–99)
Potassium: 4.1 mmol/L (ref 3.5–5.1)
Sodium: 142 mmol/L (ref 135–145)
Total Bilirubin: 0.2 mg/dL — ABNORMAL LOW (ref 0.3–1.2)
Total Protein: 6.9 g/dL (ref 6.5–8.1)

## 2018-10-23 LAB — CBC WITH DIFFERENTIAL/PLATELET
Abs Immature Granulocytes: 0.1 10*3/uL — ABNORMAL HIGH (ref 0.00–0.07)
Basophils Absolute: 0 10*3/uL (ref 0.0–0.1)
Basophils Relative: 0 %
Eosinophils Absolute: 0 10*3/uL (ref 0.0–0.5)
Eosinophils Relative: 0 %
HCT: 27.4 % — ABNORMAL LOW (ref 36.0–46.0)
Hemoglobin: 9 g/dL — ABNORMAL LOW (ref 12.0–15.0)
Immature Granulocytes: 2 %
Lymphocytes Relative: 13 %
Lymphs Abs: 0.9 10*3/uL (ref 0.7–4.0)
MCH: 29.5 pg (ref 26.0–34.0)
MCHC: 32.8 g/dL (ref 30.0–36.0)
MCV: 89.8 fL (ref 80.0–100.0)
Monocytes Absolute: 0.7 10*3/uL (ref 0.1–1.0)
Monocytes Relative: 10 %
Neutro Abs: 4.9 10*3/uL (ref 1.7–7.7)
Neutrophils Relative %: 75 %
Platelets: 60 10*3/uL — ABNORMAL LOW (ref 150–400)
RBC: 3.05 MIL/uL — ABNORMAL LOW (ref 3.87–5.11)
RDW: 11.9 % (ref 11.5–15.5)
WBC: 6.6 10*3/uL (ref 4.0–10.5)
nRBC: 0 % (ref 0.0–0.2)

## 2018-10-23 LAB — MAGNESIUM: Magnesium: 0.8 mg/dL — CL (ref 1.7–2.4)

## 2018-10-23 LAB — SAMPLE TO BLOOD BANK

## 2018-10-23 MED ORDER — SODIUM CHLORIDE 0.9% FLUSH
10.0000 mL | INTRAVENOUS | Status: DC | PRN
  Administered 2018-10-23: 10 mL via INTRAVENOUS
  Filled 2018-10-23: qty 10

## 2018-10-23 MED ORDER — HEPARIN SOD (PORK) LOCK FLUSH 100 UNIT/ML IV SOLN
500.0000 [IU] | Freq: Once | INTRAVENOUS | Status: AC | PRN
  Administered 2018-10-23: 13:00:00 500 [IU] via INTRAVENOUS
  Filled 2018-10-23: qty 5

## 2018-10-23 MED ORDER — MAGNESIUM OXIDE 400 (241.3 MG) MG PO TABS: 400.0000 mg | ORAL_TABLET | Freq: Two times a day (BID) | ORAL | 2 refills | Status: AC

## 2018-10-23 NOTE — Telephone Encounter (Signed)
Notified by lab that magnesium today is 0.8. Dr. Irene Limbo informed. He ordered supplemental magnesium for patient - escribed to pharmacy.  Attempted to contact patient to advise of results and medication ordered. Left message on voice mail and encouraged patient to contact office for questions or concerns.

## 2018-10-23 NOTE — Progress Notes (Signed)
HEMATOLOGY/ONCOLOGY CLINIC NOTE  Date of Service: 10/23/18     Patient Care Team: [name], FNP as PCP - General (Nurse Practitioner) Pixie Casino, MD as PCP - Cardiology (Cardiology)  CHIEF COMPLAINTS  F/u for continued management of metastatic lung adenocarcinoma  Diagnosis Metastatic Lung Adenocarcinoma with rt flank subcutaneous metastasis.  Current treatment: Carboplatin/Alimta/Avastin  PreviousTreatment  -Concurrent Chemo-radiation with Carboplatin/Taxol. Completed RT on 02/13/2016 and has then completed 2 cycles of carboplatin + taxol. -Started Maintenance Durvalumab from  05/24/2016 q2weeks, switched to Nivolumab q2weeks on 12/13/16 once metastatic disease noted -s/p palliative RT to rt sided Subcutaneous metastatic mass.   Nivolumab q2 weeks Palliative RT to pain rt inguinal mass   HISTORY OF PRESENTING ILLNESS: Plz see my previous note for details on initial presentation.   INTERVAL HISTORY:  Marilyn [name] returns today for followup of her metastatic lung cancer. The patient's last visit with [place] was on 10/11/2018. The pt reports that she is doing well overall.  The pt reports that she has had more energy than she has had recently, but she is still fatigued generally. She continues to receive home care, which is helpful. She notes that she has had increased pigmentation. She has been experiencing some shortness of breath. She has been walking with her mother every day in the evening for some exercise. She has increased her water intake to three 16 oz water bottles per day. She notes that she hasn't gotten her blood sugar test strips from the pharmacy yet due to some difficulty with her insurance.   Lab results today (10/23/18) of CBC w/diff and CMP is as follows: all values are WNL except for RBC at 3.05, hemoglobin at 9.0, HCT at 27.4, platelets at 60, and abs immature granulocytes at 0.10K.  On review of systems, pt reports headaches, increased  appetite, some mild neuropathy and denies any other symptoms.    MEDICAL HISTORY:  Past Medical History:  Diagnosis Date  . Arthritis   . Asthma   . Diabetes mellitus    Type II  . Hemorrhoids   . Hypercholesteremia   . Hypertension   . Hypothyroidism   . Metastatic lung cancer (metastasis from lung to other site) (HCC)    Lung, Mets to skin- right flank. CT angio 2019 suspicious for axillary, external iliac, right inguinal mets  . 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 NICM (nonischemic cardiomyopathy) ([place])    a. mild - EF 45-50% by echo 10/2016 but EF 52% by low risk nuc 11/2016. b. Echo 07/2017 - EF normal, mild AI, unremarkable echo otherwise.  . Obese   . Pneumonia 2017  . Sinus tachycardia    a. dates back to at least 2013, etiology unclear.    SURGICAL HISTORY: Past Surgical History:  Procedure Laterality Date  . CESAREAN SECTION    . COLONOSCOPY    . I/D  arm Right 2013  . IR FLUORO GUIDE PORT INSERTION RIGHT  02/18/2017  . IR GENERIC HISTORICAL  01/08/2016   IR US GUIDE VASC ACCESS RIGHT 01/08/2016 [name], DO [facility]  . IR GENERIC HISTORICAL  01/08/2016   IR FLUORO GUIDE CV LINE RIGHT 01/08/2016 [name], DO [facility]  . IR THORACENTESIS ASP PLEURAL SPACE W/IMG GUIDE  05/25/2018  . IR US GUIDE VASC ACCESS RIGHT  02/18/2017  . MULTIPLE EXTRACTIONS WITH ALVEOLOPLASTY N/A 03/25/2017   Procedure: MULTIPLE EXTRACTION WITH ALVEOLOPLASTY (teeth #s four, six, seven,  eight, nine, ten, eleven, one, twenty-three, twenty-four, twenty-five, twenty-six, twenty-nine, thirty);  Surgeon: [name], [name];  Location: [facility];  Service: Oral Surgery;  Laterality: N/A;  . TUBAL LIGATION    . US guided core needle biopsy     of right lower neck/supraclavicular lymph nodes    SOCIAL HISTORY: Social History   Socioeconomic History  . Marital status: Divorced    Spouse name: Not on file  . Number of children: Not on file  . Years of education: Not  on file  . Highest education level: Not on file  Occupational History  . Not on file  Social Needs  . Financial resource strain: Not on file  . Food insecurity    Worry: Not on file    Inability: Not on file  . Transportation needs    Medical: No    Non-medical: No  Tobacco Use  . Smoking status: Never Smoker  . Smokeless tobacco: Never Used  Substance and Sexual Activity  . Alcohol use: No  . Drug use: No  . Sexual activity: Not Currently  Lifestyle  . Physical activity    Days per week: Not on file    Minutes per session: Not on file  . Stress: Not on file  Relationships  . Social Herbalist on phone: Not on file    Gets together: Not on file    Attends religious service: Not on file    Active member of club or organization: Not on file    Attends meetings of clubs or organizations: Not on file    Relationship status: Not on file  . Intimate partner violence    Fear of current or ex partner: No    Emotionally abused: No    Physically abused: No    Forced sexual activity: No  Other Topics Concern  . Not on file  Social History Narrative   No bird or mold exposure. No recent travel.   11-23-17 Unable to ask abuse questions mother with her today.    FAMILY HISTORY: Family History  Problem Relation Age of Onset  . Heart failure Mother   . Hypertension Mother   . Cancer Neg Hx   . Rheumatologic disease Neg Hx     ALLERGIES:  has No Known Allergies.  MEDICATIONS:  Current Outpatient Medications  Medication Sig Dispense Refill  . ACCU-CHEK AVIVA PLUS test strip CHECK BLOOD SUGAR THREE TIMES A DAY BEFORE MEALS DIRECTED    . amoxicillin-clavulanate (AUGMENTIN) 875-125 MG tablet Take 1 tablet by mouth 2 (two) times daily. (Patient not taking: Reported on 08/10/2018) 14 tablet 0  . ASPIRIN LOW DOSE 81 MG EC tablet TAKE 1 TABLET BY MOUTH EVERY DAY (Patient taking differently: Take 81 mg by mouth daily. Enteric Coated.  ** DO NOT CRUSH **) 60 tablet 2  .  atorvastatin (LIPITOR) 40 MG tablet Take 1 tablet (40 mg total) by mouth daily at 6 PM. 30 tablet 0  . clotrimazole-betamethasone (LOTRISONE) cream Apply 1 application topically 2 (two) times daily. To areas of skin rash 30 g 0  . dexamethasone (DECADRON) 4 MG tablet Take 1 tab two times a day the day before Alimta chemo. Take 2 tabs two times a day starting the day after chemo for 3 days. 30 tablet 1  . DULoxetine (CYMBALTA) 60 MG capsule TAKE 1 CAPSULE BY MOUTH EVERY DAY (Patient taking differently: Take 60 mg by mouth daily. ) 30 capsule 2  . fluconazole (DIFLUCAN) 200 MG  tablet Take 1 tablet (200 mg total) by mouth daily. 1 tablet 0  . folic acid (FOLVITE) 1 MG tablet Take 1 tablet (1 mg total) by mouth daily. Start 5-7 days before Alimta chemotherapy. Continue until 21 days after Alimta completed. 100 tablet 3  . hydrOXYzine (ATARAX/VISTARIL) 10 MG tablet Take 1 tablet (10 mg total) by mouth 3 (three) times daily as needed for itching. 30 tablet 0  . insulin aspart (NOVOLOG) 100 UNIT/ML FlexPen As per sliding scale instructions (Patient taking differently: Inject 0-10 Units into the skin 3 (three) times daily. As per sliding scale instructions) 15 mL 2  . Insulin Degludec (TRESIBA FLEXTOUCH) 200 UNIT/ML SOPN Inject 100 Units into the skin daily before breakfast.     . levalbuterol (XOPENEX HFA) 45 MCG/ACT inhaler Inhale 1-2 puffs into the lungs every 8 (eight) hours as needed for wheezing. 1 Inhaler 12  . levothyroxine (SYNTHROID, LEVOTHROID) 150 MCG tablet TAKE 1 TABLET BY MOUTH EVERY DAY BEFORE BREAKFAST (Patient taking differently: Take 150 mcg by mouth daily before breakfast. ) 30 tablet 2  . lidocaine-prilocaine (EMLA) cream Apply 1 application topically as needed. 30 g 6  . lidocaine-prilocaine (EMLA) cream Apply to affected area once 30 g 3  . lidocaine-prilocaine (EMLA) cream Apply to affected area once 30 g 3  . LORazepam (ATIVAN) 0.5 MG tablet Take 1 tablet (0.5 mg total) by mouth every  6 (six) hours as needed (Nausea or vomiting). 30 tablet 0  . metoprolol succinate (TOPROL-XL) 50 MG 24 hr tablet Take 1 tablet (56m) by mouth in the morning and 1/2 tablet (27m in the evening. Take with or immediately following a meal. 135 tablet 3  . naproxen (NAPROSYN) 500 MG tablet Take 1 tablet (500 mg total) by mouth 2 (two) times daily with a meal. (Patient not taking: Reported on 08/10/2018) 20 tablet 0  . nitrofurantoin, macrocrystal-monohydrate, (MACROBID) 100 MG capsule Take 1 capsule (100 mg total) by mouth every 12 (twelve) hours. (Patient not taking: Reported on 08/10/2018) 8 capsule 0  . omeprazole (PRILOSEC) 20 MG capsule Take by mouth daily.    . ondansetron (ZOFRAN) 8 MG tablet Take 1 tablet (8 mg total) by mouth 2 (two) times daily as needed for refractory nausea / vomiting. Start on day 3 after chemo. 30 tablet 1  . oxyCODONE-acetaminophen (PERCOCET) 5-325 MG tablet Take 1 tablet by mouth every 6 (six) hours as needed for moderate pain or severe pain. 50 tablet 0  . pregabalin (LYRICA) 50 MG capsule Take 50 mg by mouth 3 (three) times daily.    . prochlorperazine (COMPAZINE) 10 MG tablet Take 1 tablet (10 mg total) by mouth every 6 (six) hours as needed (Nausea or vomiting). 30 tablet 1  . senna-docusate (SENNA S) 8.6-50 MG tablet Take 2 tablets by mouth 2 (two) times daily. May reduce to 2 tab po HS as needed once regular BM estabished (Patient taking differently: Take 2 tablets by mouth 2 (two) times daily as needed (FOR CONSTIPATION). =) 60 tablet 1  . traZODone (DESYREL) 50 MG tablet Take 1-2 tablets (50-100 mg total) by mouth at bedtime as needed for sleep. (Patient not taking: Reported on 08/10/2018) 60 tablet 0  . Vitamins A & D (VITAMIN A & D) OINT Apply 1 application topically 2 (two) times daily. 1 Tube 3   No current facility-administered medications for this visit.    Facility-Administered Medications Ordered in Other Visits  Medication Dose Route Frequency Provider Last  Rate Last Dose  .  sodium chloride 0.9 % injection 10 mL  10 mL Intravenous PRN [name], MD   10 mL at 05/24/16 1252    REVIEW OF SYSTEMS:   A 10+ POINT REVIEW OF SYSTEMS WAS OBTAINED including neurology, dermatology, psychiatry, cardiac, respiratory, lymph, extremities, GI, GU, Musculoskeletal, constitutional, breasts, reproductive, HEENT.  All pertinent positives are noted in the HPI.  All others are negative.    PHYSICAL EXAMINATION: ECOG PERFORMANCE STATUS: 1 - Symptomatic but completely ambulatory  Vitals:   10/23/18 1406  BP: (!) 145/94  Pulse: (!) 112  Resp: 18  Temp: 98.7 F (37.1 C)  SpO2: 97%   Filed Weights   10/23/18 1406  Weight: 258 lb 9.6 oz (117.3 kg)   .Body mass index is 38.19 kg/m.  Examined in a wheelchair  GENERAL:alert, in no acute distress and comfortable SKIN: no acute rashes, no significant lesions EYES: conjunctiva are pink and non-injected, sclera anicteric OROPHARYNX: MMM, no exudates, no oropharyngeal erythema or ulceration NECK: supple, no JVD LYMPH:  no palpable lymphadenopathy in the cervical, axillary or inguinal regions LUNGS: clear to auscultation b/l with normal respiratory effort HEART: regular rate & rhythm ABDOMEN:  normoactive bowel sounds , non tender, not distended. Extremity: no pedal edema PSYCH: alert & oriented x 3 with fluent speech NEURO: no focal motor/sensory deficits    LABORATORY DATA: .  [name] CBC Latest Ref Rng & Units 10/23/2018 10/11/2018 10/03/2018  WBC 4.0 - 10.5 K/uL 6.6 12.1(H) 6.8  Hemoglobin 12.0 - 15.0 g/dL 9.0(L) 8.3(L) 9.5(L)  Hematocrit 36.0 - 46.0 % 27.4(L) 25.5(L) 28.3(L)  Platelets 150 - 400 K/uL 60(L) 243 34(L)    . CMP Latest Ref Rng & Units 10/23/2018 10/11/2018 10/03/2018  Glucose 70 - 99 mg/dL 189(H) 174(H) 153(H)  BUN 6 - 20 mg/dL 8 21(H) 24(H)  Creatinine 0.44 - 1.00 mg/dL 0.96 0.96 1.16(H)  Sodium 135 - 145 mmol/L 142 140 142  Potassium 3.5 - 5.1 mmol/L 4.1 4.2 3.4(L)  Chloride 98  - 111 mmol/L 105 103 105  CO2 22 - 32 mmol/L _0 Calcium 8.9 - 10.3 mg/dL 8.0(L) 7.9(L) 7.8(L)  Total Protein 6.5 - 8.1 g/dL 6.9 7.2 6.8  Total Bilirubin 0.3 - 1.2 mg/dL 0.2(L) <0.2(L) 0.2(L)  Alkaline Phos 38 - 126 U/L 125 132(H) 91  AST 15 - 41 U/L 46(H) 15 10(L)  ALT 0 - 44 U/L 72(H) 25 18    09/15/18 Pleural Fluid Cytology:             08/18/17 Bx:   08/16/17 Molecular Pathology:    RADIOGRAPHIC STUDIES:  .No results found.   ASSESSMENT & PLAN:  53 y.o.  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  -Noted to have a small mass in the left adrenal on CT but PET/CT neg for metastatic disease. Patient has been a lifelong nonsmoker -MRI of the brain was negative for any metastatic disease. At diagnosis. -High PDL1 expression (90%) on [facility] Neg for EGFR, ALK, ROS-1 and BRAF mutations. -Patient completed her planned definitive chemoradiation with Carbo Taxol on 02/13/2016. No prohibitive toxicities other than some grade 1 skin desquamation and some grade 1-2 radiation esophagitis. She has subsequently received 2 out of 2  planned dose of carboplatin + Taxol. -CTA chest 10/29/2016  no evidence of lung cancer progression in the chest. No PE -CT abd/pelvis-10/29/2016  Interval 5.0 x 5.0 x 4.7 cm mass in the right lateral subcutaneous fat at the level of the mid abdomen. This has CT features compatible with a metastasis or primary neoplasm. -CT C/A/P (05/12/2017): Interval development of right axillary, right external iliac and right inguinal adenopathy. Suspicious for nodal metastasis. 2. Decrease in size of right paratracheal adenopathy. 3. Decrease  in size and enhancement associated with right lateral body wall lesion. 4. New small nonspecific pulmonary nodule in the left lower lobe measuring 6 mm. 5. Stable appearance of changes secondary to external beam radiation within the right lung  08/03/17 PET which revealed 2.8 cm right axillary lymph node is markedly hypermetabolic and consistent with metastatic disease. Extensive radiation changes involving the right paramediastinal lung. There is a focus of hypermetabolism in the right lower lobe which is suspicious for residual or recurrent tumor.  11/11/17 CT C/A/P revealed There has been interval increase in number of enlarged of right axillary nodes and increase in size of right inguinal adenopathy compatible with progression of disease. Stable appearance of the right lung with changes of external beam radiation.    03/01/18 CT C/A/P revealed Bulky right subpectoral/axillary adenopathy, markedly progressive from 11/11/2017. Enlarging retrocrural lymph node. Low right paratracheal adenopathy is stable. 2. Interval resection of a right inguinal nodal mass. Stable right external iliac lymph node. 3. Cholelithiasis.  Stable common bile duct dilatation. 4.  Aortic atherosclerosis.  she received maintenance Durvalumab from  05/24/2016 q2weeks, switched to Nivolumab q2weeks on 12/13/16 in the setting of Biopsy proven isolated metastatic disease. Given the tumor is strongly PDL1 positive and patient has only isolated metastatic disease was treated with palliative RT to metastases with significant improvement. Molecular pathology from 515/19 did not reveal obviously targetable mutations with NF1 and TP53 mutations present.  05/24/18 CT Chest revealed Mixed interval change. 2. Moderate dependent right pleural effusion, significantly increased. 3. Right axillary adenopathy is decreased. 4. New left supraclavicular and left posterior mediastinal lymphadenopathy. Increased right retrocrural adenopathy. These findings are  worrisome for progression of metastatic nodal disease. 5. New mild patchy nodular consolidation in the peripheral right middle lobe, which could be inflammatory or neoplastic. 6. Similar finely nodular patchy thickening of the peripheral peribronchovascular interstitium and interlobular septa in the lower lungs bilaterally, nonspecific, lymphangitic tumor not excluded. 7. Nonspecific increased right breast skin thickening, possibly treatment related. Aortic Atherosclerosis.  05/30/18 CT Abdomen revealed Cholelithiasis, without associated inflammatory changes to suggest acute cholecystitis. Moderate right pleural effusion. Please note that the pelvis was not imaged.  07/21/18 CT Chest revealed "Progressive left supraclavicular lymphadenopathy. Right axillary, mediastinal and hilar adenopathy is grossly stable, suboptimally evaluated without contrast. These findings remain consistent with metastatic disease. 2. Slight enlargement of moderate-sized right pleural effusion with extension into the minor fissure. This limits evaluation of the previously demonstrated right middle lobe nodular density. This effusion could be malignant. No discretely enlarging pulmonary nodules identified. 3. Stable skin thickening in the right breast. 4. PET CT follow-up may be helpful to assess for hypermetabolism in the lymph nodes and right pleural space."  Pt was previously seen to have progression through immunotherapy treatment and have been holding Nivolumab  09/14/18 CT Chest revealed "Persistent right axillary, mediastinal, and left supraclavicular adenopathy. Stable to mild increase in size of these lymph nodes period. 2. Stable to mild decrease in size of moderate size right  pleural effusion. 3. Stable skin thickening of the right breast." S/p 09/15/18 Thoracentesis removed 1L of fluid 09/15/18 Cytology revealed malignant cells in the removed pleural fluid  #3 h/o radiation pneumonitis - no new symptoms. Does has some cough  with exertion but this has improved.  #4 Improving fatigue (grade 1) - improved fatigue with levothyroxine compliance with near normalization of FT4 levels.  - previously given TSH of 12 -- increased Levothyroxine from 182mg to 1573m  ---will likely need larger dose but in the setting of baseline tachycardia we are gradually increasing dose to avoid ppt cardiac arrhythmias.  TSH elevated due to medication non compliance. TSH 6.24, FT4 0.6  #5 normocytic anemia related to chemotherapy -resolving and stable  #6 Left chest wall pain due to costochondritis - mx with pain medications. CTA x 3 neg for PE. No significant pain at this time.  #7 Sinus tachycardia - on BB per cards , no PE on CTA x 3 , -Still having sinus tachycardia . Partly from deconditioning and anemia. Seeing Dr. HiDebara Pickettrom cardiology  -Continues to be on BB  #8 neoplasm related pain is currently controlled without significant medications. Clinically likely has sleep apnea . Would need to be careful with sedative medications . 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   -The pt has no prohibitive toxicities from continuing carboplatin, Avastin, and Alimta at this time.    FOLLOW UP: F/u as per scheduled appointments for C3D1   I discussed the assessment and treatment plan with the patient. The patient was provided an opportunity to ask questions and all were answered. The patient agreed with the plan and  demonstrated an understanding of the instructions.   The patient was advised to call back or seek an in-person evaluation if the symptoms worsen or if the condition fails to improve as anticipated.  The total time spent in the appt was 25 minutes and more than 50% was on counseling and direct patient cares.    GautSullivan LoneMS AAHIVMS [facility] Center[facility]ology/Oncology Physician [facility]ice):       [phone] cell):  [phone]):           [phone] AmbeJacqualyn Posey acting as a scribe for Dr. GautSullivan Lone.I have reviewed the above documentation for accuracy and completeness, and I agree with the above. .GauBrunetta Genera

## 2018-10-24 ENCOUNTER — Telehealth: Payer: Self-pay | Admitting: Hematology

## 2018-10-24 NOTE — Telephone Encounter (Signed)
Per 7/20 los F/u as per scheduled appointments for C3D1.

## 2018-10-25 ENCOUNTER — Other Ambulatory Visit: Payer: Self-pay | Admitting: Hematology

## 2018-10-25 DIAGNOSIS — Z7189 Other specified counseling: Secondary | ICD-10-CM

## 2018-10-25 DIAGNOSIS — C3491 Malignant neoplasm of unspecified part of right bronchus or lung: Secondary | ICD-10-CM

## 2018-10-26 ENCOUNTER — Other Ambulatory Visit: Payer: Self-pay | Admitting: *Deleted

## 2018-10-26 MED ORDER — LEVOTHYROXINE SODIUM 150 MCG PO TABS: ORAL_TABLET | ORAL | 2 refills | Status: DC

## 2018-10-26 NOTE — Telephone Encounter (Signed)
Received faxed refill request from [facility] for synthroid 162mcg. Patient currently taking 150 mcg.  Request for refill of current dose sent to provider's inbasket

## 2018-10-31 NOTE — Progress Notes (Signed)
HEMATOLOGY/ONCOLOGY CLINIC NOTE  Date of Service: 11/01/18     Patient Care Team: [name], FNP as PCP - General (Nurse Practitioner) Pixie Casino, MD as PCP - Cardiology (Cardiology)  CHIEF COMPLAINTS  F/u for continued management of metastatic lung adenocarcinoma  Diagnosis Metastatic Lung Adenocarcinoma with rt flank subcutaneous metastasis.  Current treatment: Carboplatin/Alimta/Avastin  PreviousTreatment  -Concurrent Chemo-radiation with Carboplatin/Taxol. Completed RT on 02/13/2016 and has then completed 2 cycles of carboplatin + taxol. -Started Maintenance Durvalumab from  05/24/2016 q2weeks, switched to Nivolumab q2weeks on 12/13/16 once metastatic disease noted -s/p palliative RT to rt sided Subcutaneous metastatic mass.   Nivolumab q2 weeks Palliative RT to pain rt inguinal mass   HISTORY OF PRESENTING ILLNESS: Plz see my previous note for details on initial presentation.   INTERVAL HISTORY:  Marilyn [name] returns today for followup of her metastatic lung cancer. She is here for C3D1 carboplatin, Avastin, and Alimta. The patient's last visit with [place] was on 10/23/2018. The pt reports that she is doing well overall.  The pt reports that she has been walking more at night. Her SOB has improved. She noticed a hard lump that appeared on her medial right thigh on Friday (10/27/2018).   She has not had a bowel movement in over 2 weeks. She reports eating well and drinking lots of water. She does not feel backed up but she does have a laxative if she needs it.  She has not been taking her Magnesium as rescribed but she takes her insulin. Pt has not been checking her blood sugars at home because she does not have a meter or testing strips. She did not take her BP medication this morning. She is taking her thyroid medications.  Lab results today (11/01/2018) of CBC w/diff and CMP is as follows: all values are WNL except for WBC at 11.8k, RBC at 2.77, HGB  at 8.4, HCT at 25.2, neutro abs at 9.4, monocytes abs at 1.1, abs immature granulocytes at 0.12, glucose bld at 368, Calcium at 8.3, Albumin at 2.9, total bilirubin at 0.2. 11/01/2018 Magnesium is 0.7 11/01/2018 Phosphorus is 4.1  On review of systems, pt reports fatigue, a new lump on her medial thigh, back itchiness and denies SOB, diarrhea and any other symptoms.   MEDICAL HISTORY:  Past Medical History:  Diagnosis Date   Arthritis    Asthma    Diabetes mellitus    Type II   Hemorrhoids    Hypercholesteremia    Hypertension    Hypothyroidism    Metastatic lung cancer (metastasis from lung to other site) (HCC)    Lung, Mets to skin- right flank. 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 mild - EF 45-50% by echo 10/2016 but EF 52% by low risk nuc 11/2016. b. Echo 07/2017 - EF normal, mild AI, unremarkable echo otherwise.   Obese    Pneumonia 2017   Sinus tachycardia    a. dates back to at least 2013, etiology unclear.    SURGICAL HISTORY: Past Surgical History:  Procedure Laterality Date   CESAREAN SECTION     COLONOSCOPY     I/D  arm Right 2013   IR FLUORO GUIDE PORT INSERTION RIGHT  02/18/2017   IR GENERIC HISTORICAL  01/08/2016   IR US GUIDE VASC ACCESS RIGHT 01/08/2016 [name],  DO [facility]   IR GENERIC HISTORICAL  01/08/2016   IR FLUORO GUIDE CV LINE RIGHT 01/08/2016 [name], DO [facility]   IR THORACENTESIS ASP PLEURAL SPACE W/IMG GUIDE  05/25/2018   IR US GUIDE VASC ACCESS RIGHT  02/18/2017   MULTIPLE EXTRACTIONS WITH ALVEOLOPLASTY N/A 03/25/2017   Procedure: MULTIPLE EXTRACTION WITH ALVEOLOPLASTY (teeth #s four, six, seven, eight, nine, ten, eleven, one, twenty-three, twenty-four, twenty-five, twenty-six, twenty-nine, thirty);  Surgeon: [name], [name];  Location: [place];  Service: Oral Surgery;   Laterality: N/A;   TUBAL LIGATION     US guided core needle biopsy     of right lower neck/supraclavicular lymph nodes    SOCIAL HISTORY: Social History   Socioeconomic History   Marital status: Divorced    Spouse name: Not on file   Number of children: Not on file   Years of education: Not on file   Highest education level: Not on file  Occupational History   Not on file  Social Needs   Financial resource strain: Not on file   Food insecurity    Worry: Not on file    Inability: Not on file   Transportation needs    Medical: No    Non-medical: No  Tobacco Use   Smoking status: Never Smoker   Smokeless tobacco: Never Used  Substance and Sexual Activity   Alcohol use: No   Drug use: No   Sexual activity: Not Currently  Lifestyle   Physical activity    Days per week: Not on file    Minutes per session: Not on file   Stress: Not on file  Relationships   Social connections    Talks on phone: Not on file    Gets together: Not on file    Attends religious service: Not on file    Active member of club or organization: Not on file    Attends meetings of clubs or organizations: Not on file    Relationship status: Not on file   Intimate partner violence    Fear of current or ex partner: No    Emotionally abused: No    Physically abused: No    Forced sexual activity: No  Other Topics Concern   Not on file  Social History Narrative   No bird or mold exposure. No recent travel.   11-23-17 Unable to ask abuse questions mother with her today.    FAMILY HISTORY: Family History  Problem Relation Age of Onset   Heart failure Mother    Hypertension Mother    Cancer Neg Hx    Rheumatologic disease Neg Hx     ALLERGIES:  has No Known Allergies.  MEDICATIONS:  Current Outpatient Medications  Medication Sig Dispense Refill   ACCU-CHEK AVIVA PLUS test strip CHECK BLOOD SUGAR THREE TIMES A DAY BEFORE MEALS DIRECTED     amoxicillin-clavulanate  (AUGMENTIN) 875-125 MG tablet Take 1 tablet by mouth 2 (two) times daily. (Patient not taking: Reported on 08/10/2018) 14 tablet 0   ASPIRIN LOW DOSE 81 MG EC tablet TAKE 1 TABLET BY MOUTH EVERY DAY (Patient taking differently: Take 81 mg by mouth daily. Enteric Coated.  ** DO NOT CRUSH **) 60 tablet 2   atorvastatin (LIPITOR) 40 MG tablet Take 1 tablet (40 mg total) by mouth daily at 6 PM. 30 tablet 0   clotrimazole-betamethasone (LOTRISONE) cream Apply 1 application topically 2 (two) times daily. To areas of skin rash 30 g 0   dexamethasone (DECADRON) 4  MG tablet Take 1 tab two times a day the day before Alimta chemo. Take 2 tabs two times a day starting the day after chemo for 3 days. 30 tablet 1   DULoxetine (CYMBALTA) 60 MG capsule TAKE 1 CAPSULE BY MOUTH EVERY DAY (Patient taking differently: Take 60 mg by mouth daily. ) 30 capsule 2   fluconazole (DIFLUCAN) 200 MG tablet Take 1 tablet (200 mg total) by mouth daily. 1 tablet 0   folic acid (FOLVITE) 1 MG tablet Take 1 tablet (1 mg total) by mouth daily. Start 5-7 days before Alimta chemotherapy. Continue until 21 days after Alimta completed. 100 tablet 3   hydrOXYzine (ATARAX/VISTARIL) 10 MG tablet Take 1 tablet (10 mg total) by mouth 3 (three) times daily as needed for itching. 30 tablet 0   insulin aspart (NOVOLOG) 100 UNIT/ML FlexPen As per sliding scale instructions (Patient taking differently: Inject 0-10 Units into the skin 3 (three) times daily. As per sliding scale instructions) 15 mL 2   Insulin Degludec (TRESIBA FLEXTOUCH) 200 UNIT/ML SOPN Inject 100 Units into the skin daily before breakfast.      levalbuterol (XOPENEX HFA) 45 MCG/ACT inhaler Inhale 1-2 puffs into the lungs every 8 (eight) hours as needed for wheezing. 1 Inhaler 12   levothyroxine (SYNTHROID) 150 MCG tablet TAKE 1 TABLET BY MOUTH EVERY DAY BEFORE BREAKFAST 30 tablet 2   lidocaine-prilocaine (EMLA) cream Apply 1 application topically as needed. 30 g 6    lidocaine-prilocaine (EMLA) cream Apply to affected area once 30 g 3   lidocaine-prilocaine (EMLA) cream Apply to affected area once 30 g 3   LORazepam (ATIVAN) 0.5 MG tablet Take 1 tablet (0.5 mg total) by mouth every 6 (six) hours as needed (Nausea or vomiting). 30 tablet 0   magnesium oxide (MAG-OX) 400 (241.3 Mg) MG tablet Take 1 tablet (400 mg total) by mouth 2 (two) times daily. 60 tablet 2   metoprolol succinate (TOPROL-XL) 50 MG 24 hr tablet Take 1 tablet (96m) by mouth in the morning and 1/2 tablet (289m in the evening. Take with or immediately following a meal. 135 tablet 3   naproxen (NAPROSYN) 500 MG tablet Take 1 tablet (500 mg total) by mouth 2 (two) times daily with a meal. (Patient not taking: Reported on 08/10/2018) 20 tablet 0   nitrofurantoin, macrocrystal-monohydrate, (MACROBID) 100 MG capsule Take 1 capsule (100 mg total) by mouth every 12 (twelve) hours. (Patient not taking: Reported on 08/10/2018) 8 capsule 0   omeprazole (PRILOSEC) 20 MG capsule Take by mouth daily.     ondansetron (ZOFRAN) 8 MG tablet Take 1 tablet (8 mg total) by mouth 2 (two) times daily as needed for refractory nausea / vomiting. Start on day 3 after chemo. 30 tablet 1   oxyCODONE-acetaminophen (PERCOCET) 5-325 MG tablet Take 1 tablet by mouth every 6 (six) hours as needed for moderate pain or severe pain. 50 tablet 0   pregabalin (LYRICA) 50 MG capsule Take 50 mg by mouth 3 (three) times daily.     prochlorperazine (COMPAZINE) 10 MG tablet Take 1 tablet (10 mg total) by mouth every 6 (six) hours as needed (Nausea or vomiting). 30 tablet 1   senna-docusate (SENNA S) 8.6-50 MG tablet Take 2 tablets by mouth 2 (two) times daily. May reduce to 2 tab po HS as needed once regular BM estabished (Patient taking differently: Take 2 tablets by mouth 2 (two) times daily as needed (FOR CONSTIPATION). =) 60 tablet 1   traZODone (DESYREL) 50  MG tablet Take 1-2 tablets (50-100 mg total) by mouth at bedtime as  needed for sleep. (Patient not taking: Reported on 08/10/2018) 60 tablet 0   Vitamins A & D (VITAMIN A & D) OINT Apply 1 application topically 2 (two) times daily. 1 Tube 3   No current facility-administered medications for this visit.    Facility-Administered Medications Ordered in Other Visits  Medication Dose Route Frequency Provider Last Rate Last Dose   sodium chloride 0.9 % injection 10 mL  10 mL Intravenous PRN [name], MD   10 mL at 05/24/16 1252    REVIEW OF SYSTEMS:    A 10+ POINT REVIEW OF SYSTEMS WAS OBTAINED including neurology, dermatology, psychiatry, cardiac, respiratory, lymph, extremities, GI, GU, Musculoskeletal, constitutional, breasts, reproductive, HEENT.  All pertinent positives are noted in the HPI.  All others are negative.   PHYSICAL EXAMINATION: ECOG PERFORMANCE STATUS: 1 - Symptomatic but completely ambulatory  Vitals:   11/01/18 0851  BP: (!) 154/95  Pulse: (!) 132  Resp: 18  Temp: 97.7 F (36.5 C)  SpO2: 94%   Filed Weights   11/01/18 0851  Weight: 261 lb (118.4 kg)   .Body mass index is 38.54 kg/m.   GENERAL:alert, in no acute distress and comfortable SKIN: no acute rashes, no significant lesions EYES: conjunctiva are pink and non-injected, sclera anicteric OROPHARYNX: MMM, no exudates, no oropharyngeal erythema or ulceration NECK: supple, no JVD LYMPH:  no palpable lymphadenopathy in the cervical, axillary or inguinal regions LUNGS: clear to auscultation b/l with normal respiratory effort HEART: regular rate & rhythm ABDOMEN:  normoactive bowel sounds , non tender, not distended. No palpable hepatosplenomegaly.  Extremity: no pedal edema, radiation related skin changes on medial upper thigh. Increasing tender induration. Cannot rule out early cellulitis PSYCH: alert & oriented x 3 with fluent speech NEURO: no focal motor/sensory deficits   LABORATORY DATA: .  [name] CBC Latest Ref Rng & Units 11/01/2018 10/23/2018 10/11/2018  WBC  4.0 - 10.5 K/uL 11.8(H) 6.6 12.1(H)  Hemoglobin 12.0 - 15.0 g/dL 8.4(L) 9.0(L) 8.3(L)  Hematocrit 36.0 - 46.0 % 25.2(L) 27.4(L) 25.5(L)  Platelets 150 - 400 K/uL 218 60(L) 243    . CMP Latest Ref Rng & Units 11/01/2018 10/23/2018 10/11/2018  Glucose 70 - 99 mg/dL 368(H) 189(H) 174(H)  BUN 6 - 20 mg/dL 10 8 21(H)  Creatinine 0.44 - 1.00 mg/dL 0.98 0.96 0.96  Sodium 135 - 145 mmol/L 138 142 140  Potassium 3.5 - 5.1 mmol/L 4.1 4.1 4.2  Chloride 98 - 111 mmol/L 102 105 103  CO2 22 - 32 mmol/L _0 Calcium 8.9 - 10.3 mg/dL 8.3(L) 8.0(L) 7.9(L)  Total Protein 6.5 - 8.1 g/dL 7.1 6.9 7.2  Total Bilirubin 0.3 - 1.2 mg/dL 0.2(L) 0.2(L) <0.2(L)  Alkaline Phos 38 - 126 U/L 123 125 132(H)  AST 15 - 41 U/L 17 46(H) 15  ALT 0 - 44 U/L 21 72(H) 25    09/15/18 Pleural Fluid Cytology:             08/18/17 Bx:   08/16/17 Molecular Pathology:    RADIOGRAPHIC STUDIES:  .No results found.   ASSESSMENT & PLAN:  53 y.o.  African-American female with history of hypertension, diabetes, asthma, dysuria mass and morbid obesity with    #1 Metastatic Lung Adenocarcinoma -on presentation Rt sided at least Stage IIIB (on diagnosis) with large right paratracheal mass with mediastinal adenopathy that appears to have grown significantly over the last 6-7 months and  rt supraclavicular LN +.  -Noted to have a small mass in the left adrenal on CT but PET/CT neg for metastatic disease. Patient has been a lifelong nonsmoker -MRI of the brain was negative for any metastatic disease. At diagnosis. -High PDL1 expression (90%) on foundation One Neg for EGFR, ALK, ROS-1 and BRAF mutations. -Patient completed her planned definitive chemoradiation with Carbo Taxol on 02/13/2016. No prohibitive toxicities other than some grade 1 skin desquamation and some grade 1-2 radiation esophagitis. She has subsequently received 2 out of 2 planned dose of carboplatin + Taxol. -CTA chest 10/29/2016  no evidence of  lung cancer progression in the chest. No PE -CT abd/pelvis-10/29/2016  Interval 5.0 x 5.0 x 4.7 cm mass in the right lateral subcutaneous fat at the level of the mid abdomen. This has CT features compatible with a metastasis or primary neoplasm. -CT C/A/P (05/12/2017): Interval development of right axillary, right external iliac and right inguinal adenopathy. Suspicious for nodal metastasis. 2. Decrease in size of right paratracheal adenopathy. 3. Decrease in size and enhancement associated with right lateral body wall lesion. 4. New small nonspecific pulmonary nodule in the left lower lobe measuring 6 mm. 5. Stable appearance of changes secondary to external beam radiation within the right lung  08/03/17 PET which revealed 2.8 cm right axillary lymph node is markedly hypermetabolic and consistent with metastatic disease. Extensive radiation changes involving the right paramediastinal lung. There is a focus of hypermetabolism in the right lower lobe which is suspicious for residual or recurrent tumor.  11/11/17 CT C/A/P revealed There has been interval increase in number of enlarged of right axillary nodes and increase in size of right inguinal adenopathy compatible with progression of disease. Stable appearance of the right lung with changes of external beam radiation.    03/01/18 CT C/A/P revealed Bulky right subpectoral/axillary adenopathy, markedly progressive from 11/11/2017. Enlarging retrocrural lymph node. Low right paratracheal adenopathy is stable. 2. Interval resection of a right inguinal nodal mass. Stable right external iliac lymph node. 3. Cholelithiasis.  Stable common bile duct dilatation. 4.  Aortic atherosclerosis.  she received maintenance Durvalumab from  05/24/2016 q2weeks, switched to Nivolumab q2weeks on 12/13/16 in the setting of Biopsy proven isolated metastatic disease. Given the tumor is strongly PDL1 positive and patient has only isolated metastatic disease was treated with palliative RT  to metastases with significant improvement. Molecular pathology from 515/19 did not reveal obviously targetable mutations with NF1 and TP53 mutations present.  05/24/18 CT Chest revealed Mixed interval change. 2. Moderate dependent right pleural effusion, significantly increased. 3. Right axillary adenopathy is decreased. 4. New left supraclavicular and left posterior mediastinal lymphadenopathy. Increased right retrocrural adenopathy. These findings are worrisome for progression of metastatic nodal disease. 5. New mild patchy nodular consolidation in the peripheral right middle lobe, which could be inflammatory or neoplastic. 6. Similar finely nodular patchy thickening of the peripheral peribronchovascular interstitium and interlobular septa in the lower lungs bilaterally, nonspecific, lymphangitic tumor not excluded. 7. Nonspecific increased right breast skin thickening, possibly treatment related. Aortic Atherosclerosis.  05/30/18 CT Abdomen revealed Cholelithiasis, without associated inflammatory changes to suggest acute cholecystitis. Moderate right pleural effusion. Please note that the pelvis was not imaged.  07/21/18 CT Chest revealed "Progressive left supraclavicular lymphadenopathy. Right axillary, mediastinal and hilar adenopathy is grossly stable, suboptimally evaluated without contrast. These findings remain consistent with metastatic disease. 2. Slight enlargement of moderate-sized right pleural effusion with extension into the minor fissure. This limits evaluation of the previously demonstrated right middle lobe  nodular density. This effusion could be malignant. No discretely enlarging pulmonary nodules identified. 3. Stable skin thickening in the right breast. 4. PET CT follow-up may be helpful to assess for hypermetabolism in the lymph nodes and right pleural space."  Pt was previously seen to have progression through immunotherapy treatment and have been holding Nivolumab  09/14/18 CT Chest  revealed "Persistent right axillary, mediastinal, and left supraclavicular adenopathy. Stable to mild increase in size of these lymph nodes period. 2. Stable to mild decrease in size of moderate size right pleural effusion. 3. Stable skin thickening of the right breast." S/p 09/15/18 Thoracentesis removed 1L of fluid 09/15/18 Cytology revealed malignant cells in the removed pleural fluid  #3 h/o radiation pneumonitis - no new symptoms. Does has some cough with exertion but this has improved.  #4 Improving fatigue (grade 1) - improved fatigue with levothyroxine compliance with near normalization of FT4 levels.  - previously given TSH of 12 -- increased Levothyroxine from 185mg to 1512m  ---will likely need larger dose but in the setting of baseline tachycardia we are gradually increasing dose to avoid ppt cardiac arrhythmias.  TSH elevated due to medication non compliance. TSH 6.24, FT4 0.6  #5 normocytic anemia related to chemotherapy -resolving and stable  #6 Left chest wall pain due to costochondritis - mx with pain medications. CTA x 3 neg for PE. No significant pain at this time.  #7 Sinus tachycardia - on BB per cards , no PE on CTA x 3 , -Still having sinus tachycardia . Partly from deconditioning and anemia. Seeing Dr. HiDebara Pickettrom cardiology  -Continues to be on BB  #8 neoplasm related pain is currently controlled without significant medications. Clinically likely has sleep apnea . Would need to be careful with sedative medications . 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 -Discussed that the lump on her medial thigh could be due to fat necrosis or an infection. Cannot rule out early cellulitis -The pt has no prohibitive toxicities from continuing C3D1 Carboplatin, Avastin, and Alimta at this time.  -Repeat scans after 4 cycles. If pt is doing well, discontinue Carboplatin -Recommend OTC magnesium citrate to help with bowel movements -Pt has not been compliant with her magnesium supplement. Advised again that pt begin taking it. -receiving 4g of Magnesium IV today and additional 2g in a couple of days when she comes back for G-CSF shot -received insulin support for uncontrolled hyperglycemia -Pt needs a prescription for a new blood sugar meter -Rx Augmentin for possible cellulitis in rt thigh. -Schedule a blood transfusion in the next few days   -Plz add magnesium IV and 1 unit of PRBC to 11/03/2018 alongwith her appointment for Udenyca - Plz schedule next cycle of chemo as per orders with labs and MD visit   I discussed the assessment and treatment plan with the patient. The patient was provided an opportunity to ask questions and all were answered. The patient agreed with the plan and demonstrated an understanding of the instructions.   The patient was advised to call back  or seek an in-person evaluation if the symptoms worsen or if the condition fails to improve as anticipated.  The total time spent in the appt was 30 minutes and more than 50% was on counseling and direct patient cares.  Sullivan Lone MD [place] AAHIVMS [facility] Hematology/Oncology Physician [facility]  (Office):       [phone] (Work cell):  [phone] (Fax):           [phone]  I, De Burrs, am acting as a scribe for Dr. Irene Limbo  .I have reviewed the above documentation for accuracy and completeness, and I agree with the above. [name] MD

## 2018-11-01 ENCOUNTER — Other Ambulatory Visit: Payer: Self-pay

## 2018-11-01 ENCOUNTER — Inpatient Hospital Stay: Payer: Medicare Other

## 2018-11-01 ENCOUNTER — Telehealth: Payer: Self-pay | Admitting: Hematology

## 2018-11-01 ENCOUNTER — Inpatient Hospital Stay (HOSPITAL_BASED_OUTPATIENT_CLINIC_OR_DEPARTMENT_OTHER): Payer: Medicare Other | Admitting: Hematology

## 2018-11-01 VITALS — BP 150/87 | HR 121

## 2018-11-01 VITALS — BP 154/95 | HR 132 | Temp 97.7°F | Resp 18 | Ht 69.0 in | Wt 261.0 lb

## 2018-11-01 DIAGNOSIS — Z5111 Encounter for antineoplastic chemotherapy: Secondary | ICD-10-CM | POA: Diagnosis not present

## 2018-11-01 DIAGNOSIS — Z9221 Personal history of antineoplastic chemotherapy: Secondary | ICD-10-CM

## 2018-11-01 DIAGNOSIS — E039 Hypothyroidism, unspecified: Secondary | ICD-10-CM

## 2018-11-01 DIAGNOSIS — L0390 Cellulitis, unspecified: Secondary | ICD-10-CM

## 2018-11-01 DIAGNOSIS — R946 Abnormal results of thyroid function studies: Secondary | ICD-10-CM | POA: Diagnosis not present

## 2018-11-01 DIAGNOSIS — R000 Tachycardia, unspecified: Secondary | ICD-10-CM | POA: Diagnosis not present

## 2018-11-01 DIAGNOSIS — I10 Essential (primary) hypertension: Secondary | ICD-10-CM

## 2018-11-01 DIAGNOSIS — G620 Drug-induced polyneuropathy: Secondary | ICD-10-CM

## 2018-11-01 DIAGNOSIS — C3491 Malignant neoplasm of unspecified part of right bronchus or lung: Secondary | ICD-10-CM | POA: Diagnosis not present

## 2018-11-01 DIAGNOSIS — R739 Hyperglycemia, unspecified: Secondary | ICD-10-CM

## 2018-11-01 DIAGNOSIS — G4700 Insomnia, unspecified: Secondary | ICD-10-CM

## 2018-11-01 DIAGNOSIS — R531 Weakness: Secondary | ICD-10-CM

## 2018-11-01 DIAGNOSIS — D6481 Anemia due to antineoplastic chemotherapy: Secondary | ICD-10-CM | POA: Diagnosis not present

## 2018-11-01 DIAGNOSIS — R5383 Other fatigue: Secondary | ICD-10-CM

## 2018-11-01 DIAGNOSIS — E1165 Type 2 diabetes mellitus with hyperglycemia: Secondary | ICD-10-CM

## 2018-11-01 DIAGNOSIS — R590 Localized enlarged lymph nodes: Secondary | ICD-10-CM

## 2018-11-01 DIAGNOSIS — G893 Neoplasm related pain (acute) (chronic): Secondary | ICD-10-CM | POA: Diagnosis not present

## 2018-11-01 DIAGNOSIS — Z7189 Other specified counseling: Secondary | ICD-10-CM

## 2018-11-01 DIAGNOSIS — Z452 Encounter for adjustment and management of vascular access device: Secondary | ICD-10-CM

## 2018-11-01 DIAGNOSIS — Z7689 Persons encountering health services in other specified circumstances: Secondary | ICD-10-CM

## 2018-11-01 DIAGNOSIS — R300 Dysuria: Secondary | ICD-10-CM

## 2018-11-01 DIAGNOSIS — C3490 Malignant neoplasm of unspecified part of unspecified bronchus or lung: Secondary | ICD-10-CM

## 2018-11-01 DIAGNOSIS — Z79899 Other long term (current) drug therapy: Secondary | ICD-10-CM

## 2018-11-01 DIAGNOSIS — I700 Atherosclerosis of aorta: Secondary | ICD-10-CM

## 2018-11-01 DIAGNOSIS — Z923 Personal history of irradiation: Secondary | ICD-10-CM

## 2018-11-01 DIAGNOSIS — M940 Chondrocostal junction syndrome [Tietze]: Secondary | ICD-10-CM

## 2018-11-01 DIAGNOSIS — J90 Pleural effusion, not elsewhere classified: Secondary | ICD-10-CM

## 2018-11-01 DIAGNOSIS — T451X5A Adverse effect of antineoplastic and immunosuppressive drugs, initial encounter: Secondary | ICD-10-CM | POA: Diagnosis not present

## 2018-11-01 DIAGNOSIS — K208 Other esophagitis: Secondary | ICD-10-CM

## 2018-11-01 DIAGNOSIS — C792 Secondary malignant neoplasm of skin: Secondary | ICD-10-CM | POA: Diagnosis not present

## 2018-11-01 DIAGNOSIS — K8020 Calculus of gallbladder without cholecystitis without obstruction: Secondary | ICD-10-CM

## 2018-11-01 DIAGNOSIS — R42 Dizziness and giddiness: Secondary | ICD-10-CM

## 2018-11-01 DIAGNOSIS — Z95828 Presence of other vascular implants and grafts: Secondary | ICD-10-CM

## 2018-11-01 DIAGNOSIS — Z974 Presence of external hearing-aid: Secondary | ICD-10-CM

## 2018-11-01 DIAGNOSIS — C3492 Malignant neoplasm of unspecified part of left bronchus or lung: Secondary | ICD-10-CM

## 2018-11-01 DIAGNOSIS — E119 Type 2 diabetes mellitus without complications: Secondary | ICD-10-CM

## 2018-11-01 DIAGNOSIS — J45909 Unspecified asthma, uncomplicated: Secondary | ICD-10-CM

## 2018-11-01 DIAGNOSIS — E7800 Pure hypercholesterolemia, unspecified: Secondary | ICD-10-CM

## 2018-11-01 DIAGNOSIS — Z9114 Patient's other noncompliance with medication regimen: Secondary | ICD-10-CM

## 2018-11-01 DIAGNOSIS — I159 Secondary hypertension, unspecified: Secondary | ICD-10-CM

## 2018-11-01 LAB — CMP (CANCER CENTER ONLY)
ALT: 21 U/L (ref 0–44)
AST: 17 U/L (ref 15–41)
Albumin: 2.9 g/dL — ABNORMAL LOW (ref 3.5–5.0)
Alkaline Phosphatase: 123 U/L (ref 38–126)
Anion gap: 13 (ref 5–15)
BUN: 10 mg/dL (ref 6–20)
CO2: 23 mmol/L (ref 22–32)
Calcium: 8.3 mg/dL — ABNORMAL LOW (ref 8.9–10.3)
Chloride: 102 mmol/L (ref 98–111)
Creatinine: 0.98 mg/dL (ref 0.44–1.00)
GFR, Est AFR Am: >60 mL/min (ref >60)
GFR, Estimated: >60 mL/min (ref >60)
Glucose, Bld: 368 mg/dL — ABNORMAL HIGH (ref 70–99)
Potassium: 4.1 mmol/L (ref 3.5–5.1)
Sodium: 138 mmol/L (ref 135–145)
Total Bilirubin: 0.2 mg/dL — ABNORMAL LOW (ref 0.3–1.2)
Total Protein: 7.1 g/dL (ref 6.5–8.1)

## 2018-11-01 LAB — CBC WITH DIFFERENTIAL/PLATELET
Abs Immature Granulocytes: 0.12 10*3/uL — ABNORMAL HIGH (ref 0.00–0.07)
Basophils Absolute: 0 10*3/uL (ref 0.0–0.1)
Basophils Relative: 0 %
Eosinophils Absolute: 0.1 10*3/uL (ref 0.0–0.5)
Eosinophils Relative: 1 %
HCT: 25.2 % — ABNORMAL LOW (ref 36.0–46.0)
Hemoglobin: 8.4 g/dL — ABNORMAL LOW (ref 12.0–15.0)
Immature Granulocytes: 1 %
Lymphocytes Relative: 9 %
Lymphs Abs: 1.1 10*3/uL (ref 0.7–4.0)
MCH: 30.3 pg (ref 26.0–34.0)
MCHC: 33.3 g/dL (ref 30.0–36.0)
MCV: 91 fL (ref 80.0–100.0)
Monocytes Absolute: 1.1 10*3/uL — ABNORMAL HIGH (ref 0.1–1.0)
Monocytes Relative: 10 %
Neutro Abs: 9.4 10*3/uL — ABNORMAL HIGH (ref 1.7–7.7)
Neutrophils Relative %: 79 %
Platelets: 218 10*3/uL (ref 150–400)
RBC: 2.77 MIL/uL — ABNORMAL LOW (ref 3.87–5.11)
RDW: 12.2 % (ref 11.5–15.5)
WBC: 11.8 10*3/uL — ABNORMAL HIGH (ref 4.0–10.5)
nRBC: 0.2 % (ref 0.0–0.2)

## 2018-11-01 LAB — MAGNESIUM: Magnesium: 0.7 mg/dL — CL (ref 1.7–2.4)

## 2018-11-01 LAB — PHOSPHORUS: Phosphorus: 4.1 mg/dL (ref 2.5–4.6)

## 2018-11-01 MED ORDER — INSULIN REGULAR HUMAN 100 UNIT/ML IJ SOLN
6.0000 [IU] | Freq: Once | INTRAMUSCULAR | Status: AC
  Administered 2018-11-01: 6 [IU] via SUBCUTANEOUS
  Filled 2018-11-01: qty 10

## 2018-11-01 MED ORDER — SODIUM CHLORIDE 0.9 % IV SOLN
Freq: Once | INTRAVENOUS | Status: AC
  Administered 2018-11-01: 11:00:00 via INTRAVENOUS
  Filled 2018-11-01: qty 5

## 2018-11-01 MED ORDER — CLONIDINE HCL 0.1 MG PO TABS
ORAL_TABLET | ORAL | Status: AC
  Filled 2018-11-01: qty 1

## 2018-11-01 MED ORDER — SODIUM CHLORIDE 0.9 % IV SOLN
15.3000 mg/kg | Freq: Once | INTRAVENOUS | Status: AC
  Administered 2018-11-01: 1800 mg via INTRAVENOUS
  Filled 2018-11-01: qty 64

## 2018-11-01 MED ORDER — SODIUM CHLORIDE 0.9 % IV SOLN
INTRAVENOUS | Status: DC
  Administered 2018-11-01: 10:00:00 via INTRAVENOUS
  Filled 2018-11-01: qty 250

## 2018-11-01 MED ORDER — DIPHENHYDRAMINE HCL 50 MG/ML IJ SOLN
INTRAMUSCULAR | Status: AC
  Filled 2018-11-01: qty 1

## 2018-11-01 MED ORDER — FAMOTIDINE IN NACL 20-0.9 MG/50ML-% IV SOLN
INTRAVENOUS | Status: AC
  Filled 2018-11-01: qty 50

## 2018-11-01 MED ORDER — PALONOSETRON HCL INJECTION 0.25 MG/5ML
INTRAVENOUS | Status: AC
  Filled 2018-11-01: qty 5

## 2018-11-01 MED ORDER — HEPARIN SOD (PORK) LOCK FLUSH 100 UNIT/ML IV SOLN
500.0000 [IU] | Freq: Once | INTRAVENOUS | Status: AC | PRN
  Administered 2018-11-01: 500 [IU]
  Filled 2018-11-01: qty 5

## 2018-11-01 MED ORDER — CLONIDINE HCL 0.1 MG PO TABS
0.1000 mg | ORAL_TABLET | Freq: Once | ORAL | Status: AC | PRN
  Administered 2018-11-01: 0.1 mg via ORAL

## 2018-11-01 MED ORDER — CYANOCOBALAMIN 1000 MCG/ML IJ SOLN
INTRAMUSCULAR | Status: AC
  Filled 2018-11-01: qty 1

## 2018-11-01 MED ORDER — SODIUM CHLORIDE 0.9% FLUSH
10.0000 mL | INTRAVENOUS | Status: DC | PRN
  Administered 2018-11-01: 15:00:00 10 mL
  Filled 2018-11-01: qty 10

## 2018-11-01 MED ORDER — DIPHENHYDRAMINE HCL 50 MG/ML IJ SOLN
25.0000 mg | Freq: Once | INTRAMUSCULAR | Status: AC
  Administered 2018-11-01: 25 mg via INTRAVENOUS

## 2018-11-01 MED ORDER — SODIUM CHLORIDE 0.9 % IV SOLN: 2.0000 g | Freq: Once | INTRAVENOUS | Status: DC

## 2018-11-01 MED ORDER — CYANOCOBALAMIN 1000 MCG/ML IJ SOLN
1000.0000 ug | Freq: Once | INTRAMUSCULAR | Status: AC
  Administered 2018-11-01: 1000 ug via SUBCUTANEOUS

## 2018-11-01 MED ORDER — MAGNESIUM SULFATE 4 GM/100ML IV SOLN
4.0000 g | Freq: Once | INTRAVENOUS | Status: AC
  Administered 2018-11-01: 4 g via INTRAVENOUS
  Filled 2018-11-01: qty 100

## 2018-11-01 MED ORDER — PALONOSETRON HCL INJECTION 0.25 MG/5ML
0.2500 mg | Freq: Once | INTRAVENOUS | Status: AC
  Administered 2018-11-01: 0.25 mg via INTRAVENOUS

## 2018-11-01 MED ORDER — HYDRALAZINE HCL 20 MG/ML IJ SOLN: 5.0000 mg | Freq: Once | INTRAMUSCULAR | Status: DC | PRN

## 2018-11-01 MED ORDER — FAMOTIDINE IN NACL 20-0.9 MG/50ML-% IV SOLN
20.0000 mg | Freq: Once | INTRAVENOUS | Status: AC
  Administered 2018-11-01: 20 mg via INTRAVENOUS

## 2018-11-01 MED ORDER — SODIUM CHLORIDE 0.9 % IV SOLN
Freq: Once | INTRAVENOUS | Status: AC
  Administered 2018-11-01: 10:00:00 via INTRAVENOUS
  Filled 2018-11-01: qty 250

## 2018-11-01 MED ORDER — SODIUM CHLORIDE 0.9% FLUSH
10.0000 mL | INTRAVENOUS | Status: DC | PRN
  Administered 2018-11-01: 10 mL via INTRAVENOUS
  Filled 2018-11-01: qty 10

## 2018-11-01 MED ORDER — SODIUM CHLORIDE 0.9 % IV SOLN
500.0000 mg/m2 | Freq: Once | INTRAVENOUS | Status: AC
  Administered 2018-11-01: 1200 mg via INTRAVENOUS
  Filled 2018-11-01: qty 40

## 2018-11-01 MED ORDER — SODIUM CHLORIDE 0.9 % IV SOLN
600.0000 mg | Freq: Once | INTRAVENOUS | Status: AC
  Administered 2018-11-01: 600 mg via INTRAVENOUS
  Filled 2018-11-01: qty 60

## 2018-11-01 NOTE — Progress Notes (Signed)
Increase Mag to 4g today and change hydralzine to clonidine for SBP > 160 per Dr Irene Limbo

## 2018-11-01 NOTE — Progress Notes (Signed)
CRITICAL VALUE STICKER  CRITICAL VALUE: Magnesium 0.7  DATE & TIME NOTIFIED: 11/01/2018 0903  MESSENGER (representative from lab):Tomi Bamberger  MD NOTIFIED: Dr. Irene Limbo   TIME OF NOTIFICATION: 9179  MD RESPONSE: Reviewed with patient

## 2018-11-01 NOTE — Progress Notes (Signed)
Verbal Order per Dr. Irene Limbo: OK to treat today with HR of 129

## 2018-11-01 NOTE — Addendum Note (Signed)
Addended by: Charlyn Minerva on: 11/01/2018 11:59 AM   Modules accepted: Orders

## 2018-11-01 NOTE — Patient Instructions (Addendum)
[facility] Discharge Instructions for Patients Receiving Chemotherapy  Today you received the following chemotherapy agents: Alimta, Carboplatin, and Immunotherapy: Avastin  To help prevent nausea and vomiting after your treatment, we encourage you to take your nausea medication as directed by your MD.   If you develop nausea and vomiting that is not controlled by your nausea medication, call the clinic.   BELOW ARE SYMPTOMS THAT SHOULD BE REPORTED IMMEDIATELY:  *FEVER GREATER THAN 100.5 F  *CHILLS WITH OR WITHOUT FEVER  NAUSEA AND VOMITING THAT IS NOT CONTROLLED WITH YOUR NAUSEA MEDICATION  *UNUSUAL SHORTNESS OF BREATH  *UNUSUAL BRUISING OR BLEEDING  TENDERNESS IN MOUTH AND THROAT WITH OR WITHOUT PRESENCE OF ULCERS  *URINARY PROBLEMS  *BOWEL PROBLEMS  UNUSUAL RASH Items with * indicate a potential emergency and should be followed up as soon as possible.  Feel free to call the clinic should you have any questions or concerns. The clinic phone number is [phone].  Please show the [place] at check-in to the Emergency Department and triage nurse.  Bevacizumab injection What is this medicine? BEVACIZUMAB (be va SIZ yoo mab) is a monoclonal antibody. It is used to treat many types of cancer. This medicine may be used for other purposes; ask your health care provider or pharmacist if you have questions. COMMON BRAND NAME(S): Avastin, MVASI, Zirabev What should I tell my health care provider before I take this medicine? They need to know if you have any of these conditions:  diabetes  heart disease  high blood pressure  history of coughing up blood  prior anthracycline chemotherapy (e.g., doxorubicin, daunorubicin, epirubicin)  recent or ongoing radiation therapy  recent or planning to have surgery  stroke  an unusual or allergic reaction to bevacizumab, hamster proteins, mouse proteins, other medicines, foods, dyes, or  preservatives  pregnant or trying to get pregnant  breast-feeding How should I use this medicine? This medicine is for infusion into a vein. It is given by a health care professional in a hospital or clinic setting. Talk to your pediatrician regarding the use of this medicine in children. Special care may be needed. Overdosage: If you think you have taken too much of this medicine contact a poison control center or emergency room at once. NOTE: This medicine is only for you. Do not share this medicine with others. What if I miss a dose? It is important not to miss your dose. Call your doctor or health care professional if you are unable to keep an appointment. What may interact with this medicine? Interactions are not expected. This list may not describe all possible interactions. Give your health care provider a list of all the medicines, herbs, non-prescription drugs, or dietary supplements you use. Also tell them if you smoke, drink alcohol, or use illegal drugs. Some items may interact with your medicine. What should I watch for while using this medicine? Your condition will be monitored carefully while you are receiving this medicine. You will need important blood work and urine testing done while you are taking this medicine. This medicine may increase your risk to bruise or bleed. Call your doctor or health care professional if you notice any unusual bleeding. This medicine should be started at least 28 days following major surgery and the site of the surgery should be totally healed. Check with your doctor before scheduling dental work or surgery while you are receiving this treatment. Talk to your doctor if you have recently had surgery or if  you have a wound that has not healed. Do not become pregnant while taking this medicine or for 6 months after stopping it. Women should inform their doctor if they wish to become pregnant or think they might be pregnant. There is a potential for  serious side effects to an unborn child. Talk to your health care professional or pharmacist for more information. Do not breast-feed an infant while taking this medicine and for 6 months after the last dose. This medicine has caused ovarian failure in some women. This medicine may interfere with the ability to have a child. You should talk to your doctor or health care professional if you are concerned about your fertility. What side effects may I notice from receiving this medicine? Side effects that you should report to your doctor or health care professional as soon as possible:  allergic reactions like skin rash, itching or hives, swelling of the face, lips, or tongue  chest pain or chest tightness  chills  coughing up blood  high fever  seizures  severe constipation  signs and symptoms of bleeding such as bloody or black, tarry stools; red or dark-brown urine; spitting up blood or brown material that looks like coffee grounds; red spots on the skin; unusual bruising or bleeding from the eye, gums, or nose  signs and symptoms of a blood clot such as breathing problems; chest pain; severe, sudden headache; pain, swelling, warmth in the leg  signs and symptoms of a stroke like changes in vision; confusion; trouble speaking or understanding; severe headaches; sudden numbness or weakness of the face, arm or leg; trouble walking; dizziness; loss of balance or coordination  stomach pain  sweating  swelling of legs or ankles  vomiting  weight gain Side effects that usually do not require medical attention (report to your doctor or health care professional if they continue or are bothersome):  back pain  changes in taste  decreased appetite  dry skin  nausea  tiredness This list may not describe all possible side effects. Call your doctor for medical advice about side effects. You may report side effects to FDA at 1-800-FDA-1088. Where should I keep my medicine? This  drug is given in a hospital or clinic and will not be stored at home. NOTE: This sheet is a summary. It may not cover all possible information. If you have questions about this medicine, talk to your doctor, pharmacist, or health care provider.  2020 Elsevier/Gold Standard (2016-03-19 14:33:29)  Coronavirus (COVID-19) Are you at risk?  Are you at risk for the Coronavirus (COVID-19)?  To be considered HIGH RISK for Coronavirus (COVID-19), you have to meet the following criteria:  . Traveled to [place], [place], [place], [place] or [place]; or in the [place] to [place], [place], [place], or [place]; and have fever, cough, and shortness of breath within the last 2 weeks of travel OR . Been in close contact with a person diagnosed with COVID-19 within the last 2 weeks and have fever, cough, and shortness of breath . IF YOU DO NOT MEET THESE CRITERIA, YOU ARE CONSIDERED LOW RISK FOR COVID-19.  What to do if you are HIGH RISK for COVID-19?  [name] If you are having a medical emergency, call 911. . Seek medical care right away. Before you go to a doctor's office, urgent care or emergency department, call ahead and tell them about your recent travel, contact with someone diagnosed with COVID-19, and your symptoms. You should receive instructions from your  physician's office regarding next steps of care.  . When you arrive at healthcare provider, tell the healthcare staff immediately you have returned from visiting [place], [place], [place], [place] or [place]; or traveled in the [place] to [place], [place], [place], or [place]; in the last two weeks or you have been in close contact with a person diagnosed with COVID-19 in the last 2 weeks.   . Tell the health care staff about your symptoms: fever, cough and shortness of breath. . After you have been seen by a medical provider, you will be either: o Tested for (COVID-19) and discharged home on quarantine except to seek medical care  if symptoms worsen, and asked to  - Stay home and avoid contact with others until you get your results (4-5 days)  - Avoid travel on public transportation if possible (such as bus, train, or airplane) or o Sent to the Emergency Department by EMS for evaluation, COVID-19 testing, and possible admission depending on your condition and test results.  What to do if you are LOW RISK for COVID-19?  Reduce your risk of any infection by using the same precautions used for avoiding the common cold or flu:  [name] Wash your hands often with soap and warm water for at least 20 seconds.  If soap and water are not readily available, use an alcohol-based hand sanitizer with at least 60% alcohol.  . If coughing or sneezing, cover your mouth and nose by coughing or sneezing into the elbow areas of your shirt or coat, into a tissue or into your sleeve (not your hands). . Avoid shaking hands with others and consider head nods or verbal greetings only. . Avoid touching your eyes, nose, or mouth with unwashed hands.  . Avoid close contact with people who are sick. . Avoid places or events with large numbers of people in one location, like concerts or sporting events. . Carefully consider travel plans you have or are making. . If you are planning any travel outside or inside the [place], visit the CDC's Travelers' Health webpage for the latest health notices. . If you have some symptoms but not all symptoms, continue to monitor at home and seek medical attention if your symptoms worsen. . If you are having a medical emergency, call 911.   [place] / e-Visit: [email or web address]         [facility]: [phone]  [facility]: [phone]                   [facility]: [phone]    Magnesium Sulfate injection What is this medicine? MAGNESIUM SULFATE (mag NEE zee um SUL fate)  is an electrolyte injection commonly used to treat low magnesium levels in your blood. It is also used to prevent or control seizures in women with preeclampsia or eclampsia. This medicine may be used for other purposes; ask your health care provider or pharmacist if you have questions. What should I tell my health care provider before I take this medicine? They need to know if you have any of these conditions:  heart disease  history of irregular heart beat  kidney disease  an unusual or allergic reaction to magnesium sulfate, medicines, foods, dyes, or preservatives  pregnant or trying to get pregnant  breast-feeding How should I use this medicine? This medicine is for infusion into a vein. It is given by a health care professional in  a hospital or clinic setting. Talk to your pediatrician regarding the use of this medicine in children. While this drug may be prescribed for selected conditions, precautions do apply. Overdosage: If you think you have taken too much of this medicine contact a poison control center or emergency room at once. NOTE: This medicine is only for you. Do not share this medicine with others. What if I miss a dose? This does not apply. What may interact with this medicine? This medicine may interact with the following medications:  certain medicines for anxiety or sleep  certain medicines for seizures like phenobarbital  digoxin  medicines that relax muscles for surgery  narcotic medicines for pain This list may not describe all possible interactions. Give your health care provider a list of all the medicines, herbs, non-prescription drugs, or dietary supplements you use. Also tell them if you smoke, drink alcohol, or use illegal drugs. Some items may interact with your medicine. What should I watch for while using this medicine? Your condition will be monitored carefully while you are receiving this medicine. You may need blood work done while you are  receiving this medicine. What side effects may I notice from receiving this medicine? Side effects that you should report to your doctor or health care professional as soon as possible:  allergic reactions like skin rash, itching or hives, swelling of the face, lips, or tongue  facial flushing  muscle weakness  signs and symptoms of low blood pressure like dizziness; feeling faint or lightheaded, falls; unusually weak or tired  signs and symptoms of a dangerous change in heartbeat or heart rhythm like chest pain; dizziness; fast or irregular heartbeat; palpitations; breathing problems  sweating This list may not describe all possible side effects. Call your doctor for medical advice about side effects. You may report side effects to FDA at 1-800-FDA-1088. Where should I keep my medicine? This drug is given in a hospital or clinic and will not be stored at home. NOTE: This sheet is a summary. It may not cover all possible information. If you have questions about this medicine, talk to your doctor, pharmacist, or health care provider.  2020 Elsevier/Gold Standard (2015-10-08 12:31:42)

## 2018-11-01 NOTE — Telephone Encounter (Signed)
Scheduled appt per 7/29 los.   Spoke with patient and she is aware of her appt date and time.

## 2018-11-02 ENCOUNTER — Other Ambulatory Visit: Payer: Self-pay | Admitting: Hematology

## 2018-11-02 ENCOUNTER — Telehealth: Payer: Self-pay | Admitting: Hematology

## 2018-11-02 ENCOUNTER — Other Ambulatory Visit: Payer: Self-pay | Admitting: *Deleted

## 2018-11-02 ENCOUNTER — Telehealth: Payer: Self-pay | Admitting: *Deleted

## 2018-11-02 DIAGNOSIS — C3492 Malignant neoplasm of unspecified part of left bronchus or lung: Secondary | ICD-10-CM

## 2018-11-02 NOTE — Telephone Encounter (Signed)
Scheduled appt per 7/30 sch message - unable to reach pt . Left message with appt date and time

## 2018-11-03 ENCOUNTER — Ambulatory Visit: Payer: Medicare Other

## 2018-11-03 ENCOUNTER — Inpatient Hospital Stay: Payer: Medicare Other

## 2018-11-03 ENCOUNTER — Other Ambulatory Visit: Payer: Self-pay

## 2018-11-03 VITALS — BP 133/71 | HR 98 | Temp 98.7°F | Resp 16

## 2018-11-03 DIAGNOSIS — C792 Secondary malignant neoplasm of skin: Secondary | ICD-10-CM | POA: Diagnosis not present

## 2018-11-03 DIAGNOSIS — Z7689 Persons encountering health services in other specified circumstances: Secondary | ICD-10-CM | POA: Diagnosis not present

## 2018-11-03 DIAGNOSIS — Z7189 Other specified counseling: Secondary | ICD-10-CM

## 2018-11-03 DIAGNOSIS — Z95828 Presence of other vascular implants and grafts: Secondary | ICD-10-CM

## 2018-11-03 DIAGNOSIS — C3491 Malignant neoplasm of unspecified part of right bronchus or lung: Secondary | ICD-10-CM | POA: Diagnosis not present

## 2018-11-03 DIAGNOSIS — R590 Localized enlarged lymph nodes: Secondary | ICD-10-CM | POA: Diagnosis not present

## 2018-11-03 DIAGNOSIS — C3492 Malignant neoplasm of unspecified part of left bronchus or lung: Secondary | ICD-10-CM

## 2018-11-03 DIAGNOSIS — Z452 Encounter for adjustment and management of vascular access device: Secondary | ICD-10-CM

## 2018-11-03 DIAGNOSIS — D509 Iron deficiency anemia, unspecified: Secondary | ICD-10-CM

## 2018-11-03 DIAGNOSIS — Z9114 Patient's other noncompliance with medication regimen: Secondary | ICD-10-CM | POA: Diagnosis not present

## 2018-11-03 DIAGNOSIS — Z5111 Encounter for antineoplastic chemotherapy: Secondary | ICD-10-CM | POA: Diagnosis not present

## 2018-11-03 LAB — PREPARE RBC (CROSSMATCH)

## 2018-11-03 MED ORDER — SODIUM CHLORIDE 0.9 % IV SOLN: 2.0000 g | Freq: Once | INTRAVENOUS | Status: DC

## 2018-11-03 MED ORDER — SODIUM CHLORIDE 0.9% IV SOLUTION
250.0000 mL | Freq: Once | INTRAVENOUS | Status: AC
  Administered 2018-11-03: 250 mL via INTRAVENOUS
  Filled 2018-11-03: qty 250

## 2018-11-03 MED ORDER — ACETAMINOPHEN 325 MG PO TABS
650.0000 mg | ORAL_TABLET | Freq: Once | ORAL | Status: AC
  Administered 2018-11-03: 650 mg via ORAL

## 2018-11-03 MED ORDER — PEGFILGRASTIM-CBQV 6 MG/0.6ML ~~LOC~~ SOSY
6.0000 mg | PREFILLED_SYRINGE | Freq: Once | SUBCUTANEOUS | Status: AC
  Administered 2018-11-03: 6 mg via SUBCUTANEOUS

## 2018-11-03 MED ORDER — SODIUM CHLORIDE 0.9% FLUSH
10.0000 mL | INTRAVENOUS | Status: AC | PRN
  Administered 2018-11-03: 10 mL
  Filled 2018-11-03: qty 10

## 2018-11-03 MED ORDER — DIPHENHYDRAMINE HCL 25 MG PO CAPS
25.0000 mg | ORAL_CAPSULE | Freq: Once | ORAL | Status: AC
  Administered 2018-11-03: 25 mg via ORAL

## 2018-11-03 MED ORDER — MAGNESIUM SULFATE 2 GM/50ML IV SOLN
2.0000 g | Freq: Once | INTRAVENOUS | Status: AC
  Administered 2018-11-03: 2 g via INTRAVENOUS
  Filled 2018-11-03: qty 50

## 2018-11-03 MED ORDER — HEPARIN SOD (PORK) LOCK FLUSH 100 UNIT/ML IV SOLN
250.0000 [IU] | INTRAVENOUS | Status: AC | PRN
  Administered 2018-11-03: 250 [IU]
  Filled 2018-11-03: qty 5

## 2018-11-03 MED ORDER — DIPHENHYDRAMINE HCL 25 MG PO CAPS
ORAL_CAPSULE | ORAL | Status: AC
  Filled 2018-11-03: qty 1

## 2018-11-03 MED ORDER — SODIUM CHLORIDE 0.9% FLUSH
10.0000 mL | Freq: Once | INTRAVENOUS | Status: AC
  Administered 2018-11-03: 10 mL
  Filled 2018-11-03: qty 10

## 2018-11-03 MED ORDER — ACETAMINOPHEN 325 MG PO TABS
ORAL_TABLET | ORAL | Status: AC
  Filled 2018-11-03: qty 2

## 2018-11-03 MED ORDER — PEGFILGRASTIM-CBQV 6 MG/0.6ML ~~LOC~~ SOSY
PREFILLED_SYRINGE | SUBCUTANEOUS | Status: AC
  Filled 2018-11-03: qty 0.6

## 2018-11-03 NOTE — Patient Instructions (Signed)
Blood Transfusion, Adult, Care After This sheet gives you information about how to care for yourself after your procedure. Your doctor may also give you more specific instructions. If you have problems or questions, contact your doctor. Follow these instructions at home:   Take over-the-counter and prescription medicines only as told by your doctor.  Go back to your normal activities as told by your doctor.  Follow instructions from your doctor about how to take care of the area where an IV tube was put into your vein (insertion site). Make sure you: ? Wash your hands with soap and water before you change your bandage (dressing). If there is no soap and water, use hand sanitizer. ? Change your bandage as told by your doctor.  Check your IV insertion site every day for signs of infection. Check for: ? More redness, swelling, or pain. ? More fluid or blood. ? Warmth. ? Pus or a bad smell. Contact a doctor if:  You have more redness, swelling, or pain around the IV insertion site.  You have more fluid or blood coming from the IV insertion site.  Your IV insertion site feels warm to the touch.  You have pus or a bad smell coming from the IV insertion site.  Your pee (urine) turns pink, red, or brown.  You feel weak after doing your normal activities. Get help right away if:  You have signs of a serious allergic or body defense (immune) system reaction, including: ? Itchiness. ? Hives. ? Trouble breathing. ? Anxiety. ? Pain in your chest or lower back. ? Fever, flushing, and chills. ? Fast pulse. ? Rash. ? Watery poop (diarrhea). ? Throwing up (vomiting). ? Dark pee. ? Serious headache. ? Dizziness. ? Stiff neck. ? Yellow color in your face or the white parts of your eyes (jaundice). Summary  After a blood transfusion, return to your normal activities as told by your doctor.  Every day, check for signs of infection where the IV tube was put into your vein.  Some  signs of infection are warm skin, more redness and pain, more fluid or blood, and pus or a bad smell where the needle went in.  Contact your doctor if you feel weak or have any unusual symptoms. This information is not intended to replace advice given to you by your health care provider. Make sure you discuss any questions you have with your health care provider. Document Released: 04/12/2014 Document Revised: 07/27/2017 Document Reviewed: 11/14/2015 Elsevier Patient Education  [place].   Hypomagnesemia Hypomagnesemia is a condition in which the level of magnesium in the blood is low. Magnesium is a mineral that is found in many foods. It is used in many different processes in the body. Hypomagnesemia can affect every organ in the body. In severe cases, it can cause life-threatening problems. What are the causes? This condition may be caused by:  Not getting enough magnesium in your diet.  Malnutrition.  Problems with absorbing magnesium from the intestines.  Dehydration.  Alcohol abuse.  Vomiting.  Severe or chronic diarrhea.  Some medicines, including medicines that make you urinate more (diuretics).  Certain diseases, such as kidney disease, diabetes, celiac disease, and overactive thyroid. What are the signs or symptoms? Symptoms of this condition include:  Loss of appetite.  Nausea and vomiting.  Involuntary shaking or trembling of a body part (tremor).  Muscle weakness.  Tingling in the arms and legs.  Sudden tightening of muscles (muscle spasms).  Confusion.  Psychiatric  issues, such as depression, irritability, or psychosis.  A feeling of fluttering of the heart.  Seizures. These symptoms are more severe if magnesium levels drop suddenly. How is this diagnosed? This condition may be diagnosed based on:  Your symptoms and medical history.  A physical exam.  Blood and urine tests. How is this treated? Treatment depends on the cause and  the severity of the condition. It may be treated with:  A magnesium supplement. This can be taken in pill form. If the condition is severe, magnesium is usually given through an IV.  Changes to your diet. You may be directed to eat foods that have a lot of magnesium, such as green leafy vegetables, peas, beans, and nuts.  Stopping any intake of alcohol. Follow these instructions at home:      Make sure that your diet includes foods with magnesium. Foods that have a lot of magnesium in them include: ? Green leafy vegetables, such as spinach and broccoli. ? Beans and peas. ? Nuts and seeds, such as almonds and sunflower seeds. ? Whole grains, such as whole grain bread and fortified cereals.  Take magnesium supplements if your health care provider tells you to do that. Take them as directed.  Take over-the-counter and prescription medicines only as told by your health care provider.  Have your magnesium levels monitored as told by your health care provider.  When you are active, drink fluids that contain electrolytes.  Avoid drinking alcohol.  Keep all follow-up visits as told by your health care provider. This is important. Contact a health care provider if:  You get worse instead of better.  Your symptoms return. Get help right away if you:  Develop severe muscle weakness.  Have trouble breathing.  Feel that your heart is racing. Summary  Hypomagnesemia is a condition in which the level of magnesium in the blood is low.  Hypomagnesemia can affect every organ in the body.  Treatment may include eating more foods that contain magnesium, taking magnesium supplements, and not drinking alcohol.  Have your magnesium levels monitored as told by your health care provider. This information is not intended to replace advice given to you by your health care provider. Make sure you discuss any questions you have with your health care provider. Document Released: 12/16/2004  Document Revised: 03/04/2017 Document Reviewed: 02/21/2017 Elsevier Patient Education  2020 [employer].   Neutropenia Neutropenia is a condition that occurs when you have a lower-than-normal level of a type of white blood cell (neutrophil) in your body. Neutrophils are made in the spongy center of large bones (bone marrow), and they fight infections. Neutrophils are your body's main defense against bacterial and fungal infections. The fewer neutrophils you have and the longer your body remains without them, the greater your risk of getting a severe infection. What are the causes? This condition can occur if your body uses up or destroys neutrophils faster than your bone marrow can make them. Neutropenia may be caused by:  A bacterial or fungal infection.  Allergic disorders.  Reactions to some medicines.  An autoimmune disease.  An enlarged spleen. This condition can also occur if your bone marrow does not produce enough neutrophils. This problem may be caused by:  Cancer.  Cancer treatments, such as radiation or chemotherapy.  Viral infections.  Medicines, such as phenytoin.  Vitamin B12 deficiency.  Diseases of the bone marrow.  Environmental toxins, such as insecticides. What are the signs or symptoms? This condition does not usually cause  symptoms. If symptoms are present, they are usually caused by an underlying infection. Symptoms of an infection may include:  Fever.  Chills.  Swollen glands.  Oral or anal ulcers.  Cough and shortness of breath.  Rash.  Skin infection.  Fatigue. How is this diagnosed? Your health care provider may suspect neutropenia if you have:  A condition that may cause neutropenia.  Symptoms during or after treatment for cancer.  Symptoms of infection, especially fever.  Frequent and unusual infections. This condition is diagnosed based on your medical history and a physical exam. Tests will also be done, such as:  A  complete blood count (CBC).  A procedure to collect a sample of bone marrow for examination (bone marrow biopsy).  A chest X-ray.  A urine culture.  A blood culture. How is this treated? Treatment depends on the underlying cause and severity of your condition. Mild neutropenia may not require treatment. Treatment may include medicines, such as:  Antibiotic medicine given through an IV.  Antiviral medicines.  Antifungal medicines.  A medicine to increase neutrophil production (colony-stimulating factor). You may get this drug through an IV or by injection.  Steroids given through an IV. If an underlying condition is causing neutropenia, you may need treatment for that condition. If medicines or cancer treatments are causing neutropenia, your health care provider may have you stop the medicines or treatment. Follow these instructions at home: Medicines   Take over-the-counter and prescription medicines only as told by your health care provider.  Get a seasonal flu shot (influenza vaccine).  Avoid people who received a vaccine in the past 30 days if that vaccine contained a live version of the germ (live vaccine). You should not get a live vaccine. Common live vaccines are polio, MMR, chicken pox, and shingles vaccines. Eating and drinking  Do not share food utensils.  Do not eat unpasteurized foods.  Do not eat raw or undercooked meat, eggs, or seafood.  Do not eat unwashed, raw fruits or vegetables. Lifestyle  Avoid exposure to groups of people or children.  Avoid being around people who are sick.  Avoid being around dirt or dust, such as in construction areas or gardens.  Do not provide direct care for pets. Avoid animal droppings. Do not clean litter boxes and bird cages.  Do not have sex unless your health care provider has approved. Hygiene   Bathe daily.  Clean the area between the genitals and the anus (perineal area) after you urinate or have a bowel  movement. If you are female, wipe from front to back.  Brush your teeth with a soft toothbrush before and after meals.  Do not use a regular razor. Use an electric razor to remove hair.  Wash your hands often. Make sure others who come in contact with you also wash their hands. If soap and water are not available, use hand sanitizer. General instructions  Follow any precautions as told by your health care provider to reduce your risk for injury or infection.  Take actions to avoid cuts and burns. For example: ? Be cautious when you use knives. Always cut away from yourself. ? Keep knives in protective sheaths or guards when not in use. ? Use oven mitts when you cook with a hot stove, oven, or grill. ? Stand a safe distance away from open fires.  Do not use tampons, enemas, or rectal suppositories unless your health care provider has approved.  Keep all follow-up visits as told by your health  care provider. This is important. Contact a health care provider if:  You have: ? A sore throat. ? A warm, red, or tender area on your skin. ? A cough. ? Frequent or painful urination. ? Vaginal discharge or itching.  You develop: ? Sores in your mouth or anus. ? Swollen lymph nodes. ? Red streaks on the skin. ? A rash. Get help right away if:  You have: ? A fever. ? Chills, or you start to shake.  You feel: ? Nauseous, or you vomit. ? Very fatigued. ? Short of breath. Summary  Neutropenia is a condition that occurs when you have a lower-than-normal level of a type of white blood cell (neutrophil) in your body.  This condition can occur if your body uses up or destroys neutrophils faster than your bone marrow can make them.  Treatment depends on the underlying cause and severity of your condition. Mild neutropenia may not require treatment.  Follow any precautions as told by your health care provider to reduce your risk for injury or infection. This information is not intended  to replace advice given to you by your health care provider. Make sure you discuss any questions you have with your health care provider. Document Released: 09/11/2001 Document Revised: 01/05/2018 Document Reviewed: 01/05/2018 Elsevier Patient Education  2020 [place] (COVID-19) Are you at risk?  Are you at risk for the Coronavirus (COVID-19)?  To be considered HIGH RISK for Coronavirus (COVID-19), you have to meet the following criteria:   Traveled to [place], [place], [place], [place] or [place]; or in the [place] to [place], [place], [place], or [place]; and have fever, cough, and shortness of breath within the last 2 weeks of travel OR  Been in close contact with a person diagnosed with COVID-19 within the last 2 weeks and have fever, cough, and shortness of breath  IF YOU DO NOT MEET THESE CRITERIA, YOU ARE CONSIDERED LOW RISK FOR COVID-19.  What to do if you are HIGH RISK for COVID-19?   If you are having a medical emergency, call 911.  Seek medical care right away. Before you go to a doctors office, urgent care or emergency department, call ahead and tell them about your recent travel, contact with someone diagnosed with COVID-19, and your symptoms. You should receive instructions from your physicians office regarding next steps of care.   When you arrive at healthcare provider, tell the healthcare staff immediately you have returned from visiting [place], [place], [place], [place] or [place]; or traveled in the [place] to [place], [place], [place], or [place]; in the last two weeks or you have been in close contact with a person diagnosed with COVID-19 in the last 2 weeks.    Tell the health care staff about your symptoms: fever, cough and shortness of breath.  After you have been seen by a medical provider, you will be either: o Tested for (COVID-19) and discharged home on quarantine except to seek medical care if symptoms worsen,  and asked to  - Stay home and avoid contact with others until you get your results (4-5 days)  - Avoid travel on public transportation if possible (such as bus, train, or airplane) or o Sent to the Emergency Department by EMS for evaluation, COVID-19 testing, and possible admission depending on your condition and test results.  What to do if you are LOW RISK for COVID-19?  Reduce your risk of any infection by using the  same precautions used for avoiding the common cold or flu:   Wash your hands often with soap and warm water for at least 20 seconds.  If soap and water are not readily available, use an alcohol-based hand sanitizer with at least 60% alcohol.   If coughing or sneezing, cover your mouth and nose by coughing or sneezing into the elbow areas of your shirt or coat, into a tissue or into your sleeve (not your hands).  Avoid shaking hands with others and consider head nods or verbal greetings only.  Avoid touching your eyes, nose, or mouth with unwashed hands.   Avoid close contact with people who are sick.  Avoid places or events with large numbers of people in one location, like concerts or sporting events.  Carefully consider travel plans you have or are making.  If you are planning any travel outside or inside the [place], visit the [place] webpage for the latest health notices.  If you have some symptoms but not all symptoms, continue to monitor at home and seek medical attention if your symptoms worsen.  If you are having a medical emergency, call 911.   [place] / e-Visit: [email or web address]         [facility]: [facility]: [phone]                   [facility]: [phone]

## 2018-11-05 LAB — TYPE AND SCREEN
ABO/RH(D): A POS
Antibody Screen: NEGATIVE
Unit division: 0

## 2018-11-05 LAB — BPAM RBC
Blood Product Expiration Date: 202008232359
ISSUE DATE / TIME: 202007311326
Unit Type and Rh: 6200

## 2018-11-05 MED ORDER — FREESTYLE SYSTEM KIT: 1.0000 | PACK | 0 refills | Status: AC | PRN

## 2018-11-06 LAB — GLUCOSE, CAPILLARY: Glucose-Capillary: 288 mg/dL — ABNORMAL HIGH (ref 70–99)

## 2018-11-07 ENCOUNTER — Telehealth: Payer: Self-pay | Admitting: *Deleted

## 2018-11-07 ENCOUNTER — Other Ambulatory Visit: Payer: Self-pay | Admitting: Hematology

## 2018-11-07 MED ORDER — AMOXICILLIN-POT CLAVULANATE 875-125 MG PO TABS: 1.0000 | ORAL_TABLET | Freq: Two times a day (BID) | ORAL | 0 refills | Status: DC

## 2018-11-07 NOTE — Telephone Encounter (Signed)
Patient called - states bump on her thigh is more painful than it was. Her daughter tried to 'pop' it. She thinks it might need to be lanced. Also said she thought you were going to send in a prescription for an antibiotic, but pharmacy never received it. Message sent to Dr. Irene Limbo.

## 2018-11-07 NOTE — Telephone Encounter (Signed)
Opened in error

## 2018-11-07 NOTE — Telephone Encounter (Signed)
Contacted patientr Dr. Irene Limbo: Antibiotic sent to pharmacy. Recommend warm compressed, if significantly increased pain might need to be lanced. He recommended either [facility], Urgent Care or possibly ED.  Patient verbalized understanding and state she will start warm compresses this afternoon and pick up/start antibiotic in the morning. She will contact office if she sees no improvement or thinks it needs to be lanced.

## 2018-11-16 ENCOUNTER — Encounter (HOSPITAL_COMMUNITY): Payer: Self-pay | Admitting: Emergency Medicine

## 2018-11-16 ENCOUNTER — Observation Stay (HOSPITAL_COMMUNITY)
Admission: EM | Admit: 2018-11-16 | Discharge: 2018-11-17 | Disposition: A | Discharge status: Home / Self Care | Payer: Medicare Other | Attending: Internal Medicine | Admitting: Internal Medicine

## 2018-11-16 ENCOUNTER — Other Ambulatory Visit (HOSPITAL_COMMUNITY): Payer: Medicare Other

## 2018-11-16 ENCOUNTER — Observation Stay (HOSPITAL_COMMUNITY): Payer: Medicare Other

## 2018-11-16 ENCOUNTER — Emergency Department (HOSPITAL_COMMUNITY): Payer: Medicare Other

## 2018-11-16 ENCOUNTER — Other Ambulatory Visit: Payer: Self-pay

## 2018-11-16 DIAGNOSIS — J45909 Unspecified asthma, uncomplicated: Secondary | ICD-10-CM | POA: Diagnosis not present

## 2018-11-16 DIAGNOSIS — L89312 Pressure ulcer of right buttock, stage 2: Secondary | ICD-10-CM | POA: Insufficient documentation

## 2018-11-16 DIAGNOSIS — E669 Obesity, unspecified: Secondary | ICD-10-CM | POA: Diagnosis not present

## 2018-11-16 DIAGNOSIS — E785 Hyperlipidemia, unspecified: Secondary | ICD-10-CM | POA: Diagnosis not present

## 2018-11-16 DIAGNOSIS — M1990 Unspecified osteoarthritis, unspecified site: Secondary | ICD-10-CM | POA: Diagnosis not present

## 2018-11-16 DIAGNOSIS — E1140 Type 2 diabetes mellitus with diabetic neuropathy, unspecified: Secondary | ICD-10-CM | POA: Insufficient documentation

## 2018-11-16 DIAGNOSIS — R0602 Shortness of breath: Secondary | ICD-10-CM | POA: Diagnosis not present

## 2018-11-16 DIAGNOSIS — Z20828 Contact with and (suspected) exposure to other viral communicable diseases: Secondary | ICD-10-CM | POA: Diagnosis not present

## 2018-11-16 DIAGNOSIS — C3490 Malignant neoplasm of unspecified part of unspecified bronchus or lung: Secondary | ICD-10-CM | POA: Diagnosis not present

## 2018-11-16 DIAGNOSIS — Z79899 Other long term (current) drug therapy: Secondary | ICD-10-CM | POA: Insufficient documentation

## 2018-11-16 DIAGNOSIS — Z79890 Hormone replacement therapy (postmenopausal): Secondary | ICD-10-CM | POA: Diagnosis not present

## 2018-11-16 DIAGNOSIS — C773 Secondary and unspecified malignant neoplasm of axilla and upper limb lymph nodes: Secondary | ICD-10-CM | POA: Insufficient documentation

## 2018-11-16 DIAGNOSIS — J90 Pleural effusion, not elsewhere classified: Secondary | ICD-10-CM

## 2018-11-16 DIAGNOSIS — J910 Malignant pleural effusion: Secondary | ICD-10-CM | POA: Diagnosis not present

## 2018-11-16 DIAGNOSIS — E119 Type 2 diabetes mellitus without complications: Secondary | ICD-10-CM | POA: Diagnosis not present

## 2018-11-16 DIAGNOSIS — D649 Anemia, unspecified: Secondary | ICD-10-CM | POA: Diagnosis not present

## 2018-11-16 DIAGNOSIS — Z791 Long term (current) use of non-steroidal anti-inflammatories (NSAID): Secondary | ICD-10-CM | POA: Insufficient documentation

## 2018-11-16 DIAGNOSIS — R079 Chest pain, unspecified: Secondary | ICD-10-CM | POA: Diagnosis not present

## 2018-11-16 DIAGNOSIS — S71101A Unspecified open wound, right thigh, initial encounter: Secondary | ICD-10-CM | POA: Diagnosis not present

## 2018-11-16 DIAGNOSIS — R000 Tachycardia, unspecified: Secondary | ICD-10-CM | POA: Diagnosis not present

## 2018-11-16 DIAGNOSIS — C792 Secondary malignant neoplasm of skin: Secondary | ICD-10-CM | POA: Diagnosis not present

## 2018-11-16 DIAGNOSIS — Z923 Personal history of irradiation: Secondary | ICD-10-CM | POA: Diagnosis not present

## 2018-11-16 DIAGNOSIS — Z794 Long term (current) use of insulin: Secondary | ICD-10-CM | POA: Insufficient documentation

## 2018-11-16 DIAGNOSIS — I110 Hypertensive heart disease with heart failure: Secondary | ICD-10-CM | POA: Insufficient documentation

## 2018-11-16 DIAGNOSIS — Z6838 Body mass index (BMI) 38.0-38.9, adult: Secondary | ICD-10-CM | POA: Insufficient documentation

## 2018-11-16 DIAGNOSIS — Z48813 Encounter for surgical aftercare following surgery on the respiratory system: Secondary | ICD-10-CM | POA: Diagnosis not present

## 2018-11-16 DIAGNOSIS — C384 Malignant neoplasm of pleura: Secondary | ICD-10-CM | POA: Diagnosis not present

## 2018-11-16 DIAGNOSIS — I5043 Acute on chronic combined systolic (congestive) and diastolic (congestive) heart failure: Secondary | ICD-10-CM | POA: Insufficient documentation

## 2018-11-16 DIAGNOSIS — R9431 Abnormal electrocardiogram [ECG] [EKG]: Secondary | ICD-10-CM | POA: Insufficient documentation

## 2018-11-16 DIAGNOSIS — E7800 Pure hypercholesterolemia, unspecified: Secondary | ICD-10-CM | POA: Insufficient documentation

## 2018-11-16 DIAGNOSIS — C3492 Malignant neoplasm of unspecified part of left bronchus or lung: Secondary | ICD-10-CM | POA: Insufficient documentation

## 2018-11-16 DIAGNOSIS — Z8249 Family history of ischemic heart disease and other diseases of the circulatory system: Secondary | ICD-10-CM | POA: Diagnosis not present

## 2018-11-16 DIAGNOSIS — E039 Hypothyroidism, unspecified: Secondary | ICD-10-CM | POA: Insufficient documentation

## 2018-11-16 DIAGNOSIS — R918 Other nonspecific abnormal finding of lung field: Secondary | ICD-10-CM | POA: Diagnosis not present

## 2018-11-16 DIAGNOSIS — I10 Essential (primary) hypertension: Secondary | ICD-10-CM | POA: Insufficient documentation

## 2018-11-16 DIAGNOSIS — L8990 Pressure ulcer of unspecified site, unspecified stage: Secondary | ICD-10-CM | POA: Insufficient documentation

## 2018-11-16 DIAGNOSIS — L0291 Cutaneous abscess, unspecified: Secondary | ICD-10-CM

## 2018-11-16 DIAGNOSIS — D696 Thrombocytopenia, unspecified: Secondary | ICD-10-CM | POA: Insufficient documentation

## 2018-11-16 LAB — CREATININE, SERUM
Creatinine, Ser: 0.9 mg/dL (ref 0.44–1.00)
GFR calc Af Amer: >60 mL/min (ref >60)
GFR calc non Af Amer: >60 mL/min (ref >60)

## 2018-11-16 LAB — BODY FLUID CELL COUNT WITH DIFFERENTIAL
Lymphs, Fluid: 38 %
Monocyte-Macrophage-Serous Fluid: 30 % — ABNORMAL LOW (ref 50–90)
Neutrophil Count, Fluid: 32 % — ABNORMAL HIGH (ref 0–25)
Total Nucleated Cell Count, Fluid: 1745 cu mm — ABNORMAL HIGH (ref 0–1000)

## 2018-11-16 LAB — CBC
HCT: 28.4 % — ABNORMAL LOW (ref 36.0–46.0)
Hemoglobin: 8.8 g/dL — ABNORMAL LOW (ref 12.0–15.0)
MCH: 28.8 pg (ref 26.0–34.0)
MCHC: 31 g/dL (ref 30.0–36.0)
MCV: 92.8 fL (ref 80.0–100.0)
Platelets: 88 10*3/uL — ABNORMAL LOW (ref 150–400)
RBC: 3.06 MIL/uL — ABNORMAL LOW (ref 3.87–5.11)
RDW: 13.4 % (ref 11.5–15.5)
WBC: 10 10*3/uL (ref 4.0–10.5)
nRBC: 0 % (ref 0.0–0.2)

## 2018-11-16 LAB — CBC WITH DIFFERENTIAL/PLATELET
Abs Immature Granulocytes: 0.12 10*3/uL — ABNORMAL HIGH (ref 0.00–0.07)
Basophils Absolute: 0 10*3/uL (ref 0.0–0.1)
Basophils Relative: 0 %
Eosinophils Absolute: 0 10*3/uL (ref 0.0–0.5)
Eosinophils Relative: 0 %
HCT: 27.4 % — ABNORMAL LOW (ref 36.0–46.0)
Hemoglobin: 8.7 g/dL — ABNORMAL LOW (ref 12.0–15.0)
Immature Granulocytes: 1 %
Lymphocytes Relative: 8 %
Lymphs Abs: 0.8 10*3/uL (ref 0.7–4.0)
MCH: 29.3 pg (ref 26.0–34.0)
MCHC: 31.8 g/dL (ref 30.0–36.0)
MCV: 92.3 fL (ref 80.0–100.0)
Monocytes Absolute: 0.9 10*3/uL (ref 0.1–1.0)
Monocytes Relative: 9 %
Neutro Abs: 8.6 10*3/uL — ABNORMAL HIGH (ref 1.7–7.7)
Neutrophils Relative %: 82 %
Platelets: 73 10*3/uL — ABNORMAL LOW (ref 150–400)
RBC: 2.97 MIL/uL — ABNORMAL LOW (ref 3.87–5.11)
RDW: 13.1 % (ref 11.5–15.5)
WBC: 10.4 10*3/uL (ref 4.0–10.5)
nRBC: 0.2 % (ref 0.0–0.2)

## 2018-11-16 LAB — BASIC METABOLIC PANEL
Anion gap: 10 (ref 5–15)
BUN: 8 mg/dL (ref 6–20)
CO2: 24 mmol/L (ref 22–32)
Calcium: 8.9 mg/dL (ref 8.9–10.3)
Chloride: 104 mmol/L (ref 98–111)
Creatinine, Ser: 0.9 mg/dL (ref 0.44–1.00)
GFR calc Af Amer: >60 mL/min (ref >60)
GFR calc non Af Amer: >60 mL/min (ref >60)
Glucose, Bld: 178 mg/dL — ABNORMAL HIGH (ref 70–99)
Potassium: 4.5 mmol/L (ref 3.5–5.1)
Sodium: 138 mmol/L (ref 135–145)

## 2018-11-16 LAB — PROTEIN, PLEURAL OR PERITONEAL FLUID: Total protein, fluid: 4.4 g/dL

## 2018-11-16 LAB — PROCALCITONIN: Procalcitonin: <0.1 ng/mL

## 2018-11-16 LAB — SARS CORONAVIRUS 2 BY RT PCR (HOSPITAL ORDER, PERFORMED IN ~~LOC~~ HOSPITAL LAB): SARS Coronavirus 2: NEGATIVE

## 2018-11-16 LAB — LACTATE DEHYDROGENASE, PLEURAL OR PERITONEAL FLUID: LD, Fluid: 241 U/L — ABNORMAL HIGH (ref 3–23)

## 2018-11-16 LAB — LACTATE DEHYDROGENASE: LDH: 288 U/L — ABNORMAL HIGH (ref 98–192)

## 2018-11-16 LAB — GLUCOSE, CAPILLARY: Glucose-Capillary: 185 mg/dL — ABNORMAL HIGH (ref 70–99)

## 2018-11-16 LAB — GRAM STAIN

## 2018-11-16 LAB — TROPONIN I (HIGH SENSITIVITY)
Troponin I (High Sensitivity): 28 ng/L — ABNORMAL HIGH (ref <18)
Troponin I (High Sensitivity): 31 ng/L — ABNORMAL HIGH (ref <18)

## 2018-11-16 LAB — MAGNESIUM: Magnesium: 1.6 mg/dL — ABNORMAL LOW (ref 1.7–2.4)

## 2018-11-16 LAB — GLUCOSE, PLEURAL OR PERITONEAL FLUID: Glucose, Fluid: 197 mg/dL

## 2018-11-16 LAB — HEMOGLOBIN A1C
Hgb A1c MFr Bld: 10.1 % — ABNORMAL HIGH (ref 4.8–5.6)
Mean Plasma Glucose: 243.17 mg/dL

## 2018-11-16 MED ORDER — ATORVASTATIN CALCIUM 40 MG PO TABS
40.0000 mg | ORAL_TABLET | Freq: Every day | ORAL | Status: DC
  Administered 2018-11-16: 40 mg via ORAL
  Filled 2018-11-16: qty 1

## 2018-11-16 MED ORDER — LEVALBUTEROL TARTRATE 45 MCG/ACT IN AERO: 1.0000 | INHALATION_SPRAY | Freq: Three times a day (TID) | RESPIRATORY_TRACT | Status: DC | PRN

## 2018-11-16 MED ORDER — ALBUTEROL SULFATE (2.5 MG/3ML) 0.083% IN NEBU: 2.5000 mg | INHALATION_SOLUTION | Freq: Four times a day (QID) | RESPIRATORY_TRACT | Status: DC | PRN

## 2018-11-16 MED ORDER — ASPIRIN 81 MG PO CHEW
324.0000 mg | CHEWABLE_TABLET | Freq: Once | ORAL | Status: AC
  Administered 2018-11-16: 324 mg via ORAL
  Filled 2018-11-16: qty 4

## 2018-11-16 MED ORDER — METOPROLOL SUCCINATE ER 50 MG PO TB24
50.0000 mg | ORAL_TABLET | Freq: Every day | ORAL | Status: DC
  Administered 2018-11-17: 50 mg via ORAL
  Filled 2018-11-16: qty 1

## 2018-11-16 MED ORDER — INSULIN GLARGINE 100 UNIT/ML ~~LOC~~ SOLN
70.0000 [IU] | Freq: Every day | SUBCUTANEOUS | Status: DC
  Administered 2018-11-16: 70 [IU] via SUBCUTANEOUS
  Filled 2018-11-16 (×2): qty 0.7

## 2018-11-16 MED ORDER — LIDOCAINE HCL 1 % IJ SOLN
INTRAMUSCULAR | Status: AC
  Filled 2018-11-16: qty 20

## 2018-11-16 MED ORDER — MAGNESIUM OXIDE 400 (241.3 MG) MG PO TABS
400.0000 mg | ORAL_TABLET | Freq: Two times a day (BID) | ORAL | Status: DC
  Administered 2018-11-16 – 2018-11-17 (×2): 400 mg via ORAL
  Filled 2018-11-16 (×2): qty 1

## 2018-11-16 MED ORDER — DULOXETINE HCL 60 MG PO CPEP
60.0000 mg | ORAL_CAPSULE | Freq: Every day | ORAL | Status: DC
  Administered 2018-11-16 – 2018-11-17 (×2): 60 mg via ORAL
  Filled 2018-11-16 (×2): qty 1

## 2018-11-16 MED ORDER — ONDANSETRON HCL 4 MG PO TABS: 8.0000 mg | ORAL_TABLET | Freq: Two times a day (BID) | ORAL | Status: DC | PRN

## 2018-11-16 MED ORDER — ENOXAPARIN SODIUM 40 MG/0.4ML ~~LOC~~ SOLN
40.0000 mg | SUBCUTANEOUS | Status: DC
  Administered 2018-11-16: 40 mg via SUBCUTANEOUS
  Filled 2018-11-16: qty 0.4

## 2018-11-16 MED ORDER — INSULIN ASPART 100 UNIT/ML ~~LOC~~ SOLN
0.0000 [IU] | Freq: Three times a day (TID) | SUBCUTANEOUS | Status: DC
  Administered 2018-11-17: 1 [IU] via SUBCUTANEOUS

## 2018-11-16 MED ORDER — OXYCODONE HCL 5 MG PO TABS
5.0000 mg | ORAL_TABLET | ORAL | Status: DC | PRN
  Administered 2018-11-16 – 2018-11-17 (×2): 5 mg via ORAL
  Filled 2018-11-16 (×2): qty 1

## 2018-11-16 MED ORDER — LORAZEPAM 0.5 MG PO TABS: 0.5000 mg | ORAL_TABLET | Freq: Four times a day (QID) | ORAL | Status: DC | PRN

## 2018-11-16 MED ORDER — TRAZODONE HCL 50 MG PO TABS
50.0000 mg | ORAL_TABLET | Freq: Every evening | ORAL | Status: DC | PRN
  Administered 2018-11-16: 50 mg via ORAL
  Filled 2018-11-16: qty 1

## 2018-11-16 MED ORDER — SODIUM CHLORIDE 0.9% FLUSH
10.0000 mL | INTRAVENOUS | Status: DC | PRN
  Administered 2018-11-17: 10 mL
  Filled 2018-11-16: qty 40

## 2018-11-16 MED ORDER — INSULIN ASPART 100 UNIT/ML ~~LOC~~ SOLN: 0.0000 [IU] | Freq: Every day | SUBCUTANEOUS | Status: DC

## 2018-11-16 MED ORDER — MORPHINE SULFATE (PF) 2 MG/ML IV SOLN: 2.0000 mg | INTRAVENOUS | Status: DC | PRN

## 2018-11-16 MED ORDER — DOXYCYCLINE HYCLATE 100 MG PO TABS
100.0000 mg | ORAL_TABLET | Freq: Two times a day (BID) | ORAL | Status: DC
  Administered 2018-11-16 – 2018-11-17 (×2): 100 mg via ORAL
  Filled 2018-11-16 (×2): qty 1

## 2018-11-16 MED ORDER — MAGNESIUM SULFATE 2 GM/50ML IV SOLN
2.0000 g | Freq: Once | INTRAVENOUS | Status: AC
  Administered 2018-11-16: 21:00:00 2 g via INTRAVENOUS
  Filled 2018-11-16: qty 50

## 2018-11-16 MED ORDER — ASPIRIN EC 81 MG PO TBEC
81.0000 mg | DELAYED_RELEASE_TABLET | Freq: Every day | ORAL | Status: DC
  Administered 2018-11-17: 81 mg via ORAL
  Filled 2018-11-16: qty 1

## 2018-11-16 MED ORDER — SODIUM CHLORIDE (PF) 0.9 % IJ SOLN
INTRAMUSCULAR | Status: AC
  Filled 2018-11-16: qty 50

## 2018-11-16 MED ORDER — LEVOTHYROXINE SODIUM 50 MCG PO TABS
150.0000 ug | ORAL_TABLET | Freq: Every day | ORAL | Status: DC
  Administered 2018-11-17: 150 ug via ORAL
  Filled 2018-11-16: qty 1

## 2018-11-16 MED ORDER — IOHEXOL 350 MG/ML SOLN
100.0000 mL | Freq: Once | INTRAVENOUS | Status: AC | PRN
  Administered 2018-11-16: 100 mL via INTRAVENOUS

## 2018-11-16 MED ORDER — AMOXICILLIN-POT CLAVULANATE 875-125 MG PO TABS
1.0000 | ORAL_TABLET | Freq: Two times a day (BID) | ORAL | Status: DC
  Administered 2018-11-16 – 2018-11-17 (×2): 1 via ORAL
  Filled 2018-11-16 (×2): qty 1

## 2018-11-16 MED ORDER — ACETAMINOPHEN 325 MG PO TABS: 650.0000 mg | ORAL_TABLET | Freq: Four times a day (QID) | ORAL | Status: DC | PRN

## 2018-11-16 MED ORDER — METOPROLOL SUCCINATE ER 25 MG PO TB24: 25.0000 mg | ORAL_TABLET | Freq: Every day | ORAL | Status: DC

## 2018-11-16 MED ORDER — FOLIC ACID 1 MG PO TABS
1.0000 mg | ORAL_TABLET | Freq: Every day | ORAL | Status: DC
  Administered 2018-11-17: 1 mg via ORAL
  Filled 2018-11-16: qty 1

## 2018-11-16 MED ORDER — METOPROLOL SUCCINATE ER 25 MG PO TB24: 25.0000 mg | ORAL_TABLET | Freq: Two times a day (BID) | ORAL | Status: DC

## 2018-11-16 NOTE — ED Notes (Signed)
Patient transported to CT 

## 2018-11-16 NOTE — ED Notes (Signed)
ED TO INPATIENT HANDOFF REPORT  ED Nurse Name and Phone #: Christinia Gully Name/Age/Gender Marilyn [name] 53 y.o. female Room/Bed: 1442/1442-01  Code Status   Code Status: Prior  Home/SNF/Other Given to floor Patient oriented to: self, place, time and situation Is this baseline? Yes   Triage Complete: Triage complete  Chief Complaint Pleural effusion [J90] Pleural effusion, right [J90] Nonspecific chest pain [R07.9]  Triage Note Per EMS:  Pt coming from home with c/o chest pain that started yesterday afternoon. The pts chest pain does not radiate but worsens whenever she takes a deep breath. Pt has a hx of stage 4 lung cancer and her last chemo was on 11/01/2018. Pt has a hx of diabetes also.  EMS Vitals: BP 177/124  HR 120 RR 20 SPO2 99% 96.8 temp   cbg 186   Allergies No Known Allergies  Level of Care/Admitting Diagnosis ED Disposition    ED Disposition Condition Comment   Admit  Hospital Area: [facility] [[id number]]  Level of Care: Telemetry [5]  Admit to tele based on following criteria: Monitor for Ischemic changes  Covid Evaluation: Confirmed COVID Negative  Diagnosis: Chest pain [527782]  Admitting Physician: Elodia Florence [[id number]]  Attending Physician: Cephus Slater, A CALDWELL [phone]  PT Class (Do Not Modify): Observation [104]  PT Acc Code (Do Not Modify): Observation [10022]       B Medical/Surgery History Past Medical History:  Diagnosis Date  . Arthritis   . Asthma   . Diabetes mellitus    Type II  . Hemorrhoids   . Hypercholesteremia   . Hypertension   . Hypothyroidism   . Metastatic lung cancer (metastasis from lung to other site) (HCC)    Lung, Mets to skin- right flank. CT angio 2019 suspicious for axillary, external iliac, right inguinal mets  . 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 NICM (nonischemic cardiomyopathy) ([place])    a. mild - EF 45-50% by echo 10/2016 but EF 52% by low risk  nuc 11/2016. b. Echo 07/2017 - EF normal, mild AI, unremarkable echo otherwise.  . Obese   . Pneumonia 2017  . Sinus tachycardia    a. dates back to at least 2013, etiology unclear.   Past Surgical History:  Procedure Laterality Date  . CESAREAN SECTION    . COLONOSCOPY    . I/D  arm Right 2013  . IR FLUORO GUIDE PORT INSERTION RIGHT  02/18/2017  . IR GENERIC HISTORICAL  01/08/2016   IR US GUIDE VASC ACCESS RIGHT 01/08/2016 [name], DO [facility]  . IR GENERIC HISTORICAL  01/08/2016   IR FLUORO GUIDE CV LINE RIGHT 01/08/2016 [name], DO [facility]  . IR THORACENTESIS ASP PLEURAL SPACE W/IMG GUIDE  05/25/2018  . IR US GUIDE VASC ACCESS RIGHT  02/18/2017  . MULTIPLE EXTRACTIONS WITH ALVEOLOPLASTY N/A 03/25/2017   Procedure: MULTIPLE EXTRACTION WITH ALVEOLOPLASTY (teeth #s four, six, seven, eight, nine, ten, eleven, one, twenty-three, twenty-four, twenty-five, twenty-six, twenty-nine, thirty);  Surgeon: [name], [name];  Location: [facility];  Service: Oral Surgery;  Laterality: N/A;  . TUBAL LIGATION    . US guided core needle biopsy     of right lower neck/supraclavicular lymph nodes     A IV Location/Drains/Wounds Patient Lines/Drains/Airways Status   Active Line/Drains/Airways    Name:   Placement date:   Placement time:   Site:   Days:   Implanted Port 02/18/17 Right Chest  02/18/17    1330    Chest   636   Incision (Closed) 03/25/17 Lip Other (Comment)   03/25/17    0916     601          Intake/Output Last 24 hours No intake or output data in the 24 hours ending 11/16/18 1746  Labs/Imaging Results for orders placed or performed during the hospital encounter of 11/16/18 (from the past 48 hour(s))  CBC with Differential/Platelet     Status: Abnormal   Collection Time: 11/16/18  6:42 AM  Result Value Ref Range   WBC 10.4 4.0 - 10.5 K/uL   RBC 2.97 (L) 3.87 - 5.11 MIL/uL   Hemoglobin 8.7 (L) 12.0 - 15.0 g/dL   HCT 27.4 (L) 36.0 - 46.0 %   MCV 92.3 80.0 - 100.0  fL   MCH 29.3 26.0 - 34.0 pg   MCHC 31.8 30.0 - 36.0 g/dL   RDW 13.1 11.5 - 15.5 %   Platelets 73 (L) 150 - 400 K/uL    Comment: REPEATED TO VERIFY PLATELET COUNT CONFIRMED BY SMEAR SPECIMEN CHECKED FOR CLOTS Immature Platelet Fraction may be clinically indicated, consider ordering this additional test [id number]    nRBC 0.2 0.0 - 0.2 %   Neutrophils Relative % 82 %   Neutro Abs 8.6 (H) 1.7 - 7.7 K/uL   Lymphocytes Relative 8 %   Lymphs Abs 0.8 0.7 - 4.0 K/uL   Monocytes Relative 9 %   Monocytes Absolute 0.9 0.1 - 1.0 K/uL   Eosinophils Relative 0 %   Eosinophils Absolute 0.0 0.0 - 0.5 K/uL   Basophils Relative 0 %   Basophils Absolute 0.0 0.0 - 0.1 K/uL   Immature Granulocytes 1 %   Abs Immature Granulocytes 0.12 (H) 0.00 - 0.07 K/uL    Comment: Performed at [facility], [place] [address], [place], [place] [address]  Basic metabolic panel     Status: Abnormal   Collection Time: 11/16/18  6:42 AM  Result Value Ref Range   Sodium 138 135 - 145 mmol/L   Potassium 4.5 3.5 - 5.1 mmol/L   Chloride 104 98 - 111 mmol/L   CO2 24 22 - 32 mmol/L   Glucose, Bld 178 (H) 70 - 99 mg/dL   BUN 8 6 - 20 mg/dL   Creatinine, Ser 0.90 0.44 - 1.00 mg/dL   Calcium 8.9 8.9 - 10.3 mg/dL   GFR calc non Af Amer >60 >60 mL/min   GFR calc Af Amer >60 >60 mL/min   Anion gap 10 5 - 15    Comment: Performed at [facility], [place] [address] [place], [place] [address]  Troponin I (High Sensitivity)     Status: Abnormal   Collection Time: 11/16/18  6:42 AM  Result Value Ref Range   Troponin I (High Sensitivity) 28 (H) <18 ng/L    Comment: (NOTE) Elevated high sensitivity troponin I (hsTnI) values and significant  changes across serial measurements may suggest ACS but many other  chronic and acute conditions are known to elevate hsTnI results.  Refer to the "Links" section for chest pain algorithms and additional  guidance. Performed at [facility], [place] [address], [place], [place] [address]   Magnesium     Status: Abnormal   Collection Time: 11/16/18  6:42 AM  Result Value Ref Range   Magnesium 1.6 (L) 1.7 - 2.4 mg/dL    Comment: Performed at [facility], [place]  [address], [place], [place] [address]  SARS Coronavirus 2 [facility] order, Performed in [facility] hospital lab) Nasopharyngeal Nasopharyngeal Swab     Status: None   Collection Time: 11/16/18  8:44 AM   Specimen: Nasopharyngeal Swab  Result Value Ref Range   SARS Coronavirus 2 NEGATIVE NEGATIVE    Comment: (NOTE) If result is NEGATIVE SARS-CoV-2 target nucleic acids are NOT DETECTED. The SARS-CoV-2 RNA is generally detectable in upper and lower  respiratory specimens during the acute phase of infection. The lowest  concentration of SARS-CoV-2 viral copies this assay can detect is 250  copies / mL. A negative result does not preclude SARS-CoV-2 infection  and should not be used as the sole basis for treatment or other  patient management decisions.  A negative result may occur with  improper specimen collection / handling, submission of specimen other  than nasopharyngeal swab, presence of viral mutation(s) within the  areas targeted by this assay, and inadequate number of viral copies  (<250 copies / mL). A negative result must be combined with clinical  observations, patient history, and epidemiological information. If result is POSITIVE SARS-CoV-2 target nucleic acids are DETECTED. The SARS-CoV-2 RNA is generally detectable in upper and lower  respiratory specimens dur ing the acute phase of infection.  Positive  results are indicative of active infection with SARS-CoV-2.  Clinical  correlation with patient history and other diagnostic information is  necessary to determine patient infection status.  Positive results do  not rule out bacterial infection or co-infection with other viruses. If result is PRESUMPTIVE POSTIVE SARS-CoV-2  nucleic acids MAY BE PRESENT.   A presumptive positive result was obtained on the submitted specimen  and confirmed on repeat testing.  While 2019 novel coronavirus  (SARS-CoV-2) nucleic acids may be present in the submitted sample  additional confirmatory testing may be necessary for epidemiological  and / or clinical management purposes  to differentiate between  SARS-CoV-2 and other Sarbecovirus currently known to infect humans.  If clinically indicated additional testing with an alternate test  methodology [phone]) is advised. The SARS-CoV-2 RNA is generally  detectable in upper and lower respiratory sp ecimens during the acute  phase of infection. The expected result is Negative. Fact Sheet for Patients:  StrictlyIdeas.no Fact Sheet for Healthcare Providers: BankingDealers.co.za This test is not yet approved or cleared by the [place] FDA and has been authorized for detection and/or diagnosis of SARS-CoV-2 by FDA under an Emergency Use Authorization (EUA).  This EUA will remain in effect (meaning this test can be used) for the duration of the COVID-19 declaration under Section 564(b)(1) of the Act, 21 U.S.C. section 360bbb-3(b)(1), unless the authorization is terminated or revoked sooner. Performed at [facility], [place] [address] [place], [place] [address]   Lactate dehydrogenase (pleural or peritoneal fluid)     Status: Abnormal   Collection Time: 11/16/18 12:50 PM  Result Value Ref Range   LD, Fluid 241 (H) 3 - 23 U/L    Comment: (NOTE) Results should be evaluated in conjunction with serum values    Fluid Type-FLDH Pleural R     Comment: Performed at [facility], [place] [address] [place],  [address]  Body fluid cell count with differential     Status: Abnormal   Collection Time: 11/16/18 12:50 PM  Result Value Ref Range   Fluid Type-FCT Pleural R    Color, Fluid YELLOW  YELLOW   Appearance, Fluid HAZY (A) CLEAR   WBC, Fluid  1,745 (H) 0 - 1,000 cu mm   Neutrophil Count, Fluid 32 (H) 0 - 25 %   Lymphs, Fluid 38 %   Monocyte-Macrophage-Serous Fluid 30 (L) 50 - 90 %   Other Cells, Fluid OTHER CELLS IDENTIFIED AS MESOTHELIAL CELLS %    Comment: CORRELATE WITH CYTOLOGY. Performed at [facility], [place] [address], [place], [place] [address]   Protein, pleural or peritoneal fluid     Status: None   Collection Time: 11/16/18 12:50 PM  Result Value Ref Range   Total protein, fluid 4.4 g/dL    Comment: (NOTE) No normal range established for this test Results should be evaluated in conjunction with serum values    Fluid Type-FTP Pleural R     Comment: Performed at [facility], [place] [address] [place], [place] [address]  Glucose, pleural or peritoneal fluid     Status: None   Collection Time: 11/16/18 12:50 PM  Result Value Ref Range   Glucose, Fluid 197 mg/dL    Comment: (NOTE) No normal range established for this test Results should be evaluated in conjunction with serum values    Fluid Type-FGLU Pleural R     Comment: Performed at [facility], [place] [address], [place], [place] [address]  Gram stain     Status: None   Collection Time: 11/16/18 12:50 PM   Specimen: Fluid  Result Value Ref Range   Specimen Description FLUID    Special Requests NONE    Gram Stain      WBC PRESENT,BOTH PMN AND MONONUCLEAR NO ORGANISMS SEEN CYTOSPIN SMEAR Performed at [facility], [place] [address], [place], [place] [address]    Report Status 11/16/2018 FINAL    Dg Chest 1 View  Result Date: 11/16/2018 CLINICAL DATA:  Post right thoracentesis. History of stage IV lung cancer. EXAM: CHEST  1 VIEW COMPARISON:  09/15/2018 and chest CT 11/16/2018. FINDINGS: Volume loss in the right hemithorax. Hazy densities at the right lung base may represent atelectasis. Persistent densities in the right hilar region  compatible with areas of consolidation or volume loss. Right hilar densities are unchanged. Negative for right pneumothorax. Lucency lateral to the right hilar soft tissue is similar to the prior chest radiograph. Left lung is clear. Heart and mediastinum are stable. IMPRESSION: 1. Negative for pneumothorax following the right thoracentesis. 2. Persistent volume loss in the right hemithorax with stable densities in the right hilar region. Electronically Signed   By: Markus Daft M.D.   On: 11/16/2018 13:32   Ct Angio Chest Pe W And/or Wo Contrast  Result Date: 11/16/2018 CLINICAL DATA:  Stage IV lung carcinoma. Chest pain and shortness of breath EXAM: CT ANGIOGRAPHY CHEST WITH CONTRAST TECHNIQUE: Multidetector CT imaging of the chest was performed using the standard protocol during bolus administration of intravenous contrast. Multiplanar CT image reconstructions and MIPs were obtained to evaluate the vascular anatomy. CONTRAST:  140mL OMNIPAQUE IOHEXOL 350 MG/ML SOLN COMPARISON:  September 14, 2018 FINDINGS: Cardiovascular: There is no demonstrable pulmonary embolus. There is no thoracic aortic aneurysm or dissection. Visualized great vessels appear unremarkable. There is no pericardial effusion or pericardial thickening. Port-A-Cath tip is in the superior vena cava. Mediastinum/Nodes: Visualized thyroid appears unremarkable. There remains axillary adenopathy on the right. The largest lymph node in the right axillary region measures 3.4 x 2.5 cm, similar to prior study. There is left supraclavicular lymph node enlargement medially with focal lymph node in this area measuring 2.3 x 1.9  cm, essentially stable. Right paratracheal adenopathy is also present with index lymph node in this area measuring 2.0 x 1.7 cm, essentially stable. No new adenopathy evident. No esophageal lesions appreciable. Lungs/Pleura: There is a sizable free-flowing pleural effusion on the right. The right perihilar region post radiation therapy  change is stable. There is atelectatic change in this area as well. The contour of this area is stable compared to most recent CT. No well-defined mass seen in this area. Left lung is clear. Upper Abdomen: Visualized upper abdominal structures appear unremarkable. Musculoskeletal: No blastic or lytic bone lesions. Diffuse skin thickening in the right breast region is stable. Review of the MIP images confirms the above findings. IMPRESSION: 1. No demonstrable pulmonary embolus. No thoracic aortic aneurysm or dissection. 2. Sizable free-flowing pleural effusion on the right. Post radiation therapy change in the right perihilar region is stable. There is atelectasis in this area but no well-defined mass. Subtle neoplasm in this area is difficult to exclude, although the appearance is stable compared to most recent CT. 3. Stable adenopathy in the right axillary, left supraclavicular, and right paratracheal regions. 4.  Persistent skin thickening right breast. Electronically Signed   By: Lowella Grip III M.D.   On: 11/16/2018 08:18   US Thoracentesis Asp Pleural Space W/img Guide  Result Date: 11/16/2018 INDICATION: History of metastatic lung cancer, recurrent right pleural effusion, dyspnea. Request for diagnostic and therapeutic right thoracentesis. EXAM: ULTRASOUND GUIDED RIGHT THORACENTESIS MEDICATIONS: 1% lidocaine 10 mL COMPLICATIONS: None immediate. PROCEDURE: An ultrasound guided thoracentesis was thoroughly discussed with the patient and questions answered. The benefits, risks, alternatives and complications were also discussed. The patient understands and wishes to proceed with the procedure. Written consent was obtained. Ultrasound was performed to localize and mark an adequate pocket of fluid in the right chest. The area was then prepped and draped in the normal sterile fashion. 1% Lidocaine was used for local anesthesia. Under ultrasound guidance a 6 Fr Safe-T-Centesis catheter was introduced.  Thoracentesis was performed. The catheter was removed and a dressing applied. FINDINGS: A total of approximately 450 mL of hazy yellow fluid was removed. Samples were sent to the laboratory as requested by the clinical team. IMPRESSION: Successful ultrasound guided right thoracentesis yielding 450 mL of pleural fluid. No pneumothorax on post-procedure chest x-ray. Read by: Gareth Eagle, PA-C Electronically Signed   By: Jacqulynn Cadet M.D.   On: 11/16/2018 14:11    Pending Labs Unresulted Labs (From admission, onward)    Start     Ordered   11/16/18 1252  PH, Body Fluid  RELEASE UPON ORDERING,   STAT     11/16/18 1252   11/16/18 1250  Culture, body fluid-bottle  Once,   R     11/16/18 1250   Signed and Held  HIV antibody (Routine Testing)  Once,   R     Signed and Held   Signed and Held  CBC  (enoxaparin (LOVENOX)    CrCl >/= 30 ml/min)  Once,   R    Comments: Baseline for enoxaparin therapy IF NOT ALREADY DRAWN.  Notify MD if PLT < 100 K.    Signed and Held   Signed and Held  Creatinine, serum  (enoxaparin (LOVENOX)    CrCl >/= 30 ml/min)  Once,   R    Comments: Baseline for enoxaparin therapy IF NOT ALREADY DRAWN.    Signed and Held   Signed and Held  Creatinine, serum  (enoxaparin (LOVENOX)    CrCl >/=  30 ml/min)  Weekly,   R    Comments: while on enoxaparin therapy    Signed and Held   Signed and Held  Hemoglobin A1c  Once,   R    Comments: To assess prior glycemic control    Signed and Held          Vitals/Pain Today's Vitals   11/16/18 1246 11/16/18 1253 11/16/18 1255 11/16/18 1518  BP: (!) 151/103 (!) 158/96 139/89 (!) 150/96  Pulse: (!) 109   (!) 110  Resp: 18   17  Temp:      TempSrc:      SpO2: 100%   100%  PainSc:        Isolation Precautions No active isolations  Medications Medications  sodium chloride (PF) 0.9 % injection (has no administration in time range)  lidocaine (XYLOCAINE) 1 % (with pres) injection (has no administration in time range)   iohexol (OMNIPAQUE) 350 MG/ML injection 100 mL (100 mLs Intravenous Contrast Given 11/16/18 0757)  aspirin chewable tablet 324 mg (324 mg Oral Given 11/16/18 0843)    Mobility walks Low fall risk   Focused Assessments Cardiac Assessment Handoff:  Cardiac Rhythm: Sinus tachycardia Lab Results  Component Value Date   TROPONINI 0.05 (HH) 07/27/2017   Lab Results  Component Value Date   DDIMER 2.08 (H) 07/27/2017   Does the Patient currently have chest pain? Yes     R Recommendations: See Admitting Provider Note  Report given to:   Additional Notes:

## 2018-11-16 NOTE — Procedures (Signed)
PROCEDURE SUMMARY:  Successful US guided right thoracentesis. Yielded 450 mL of hazy yellow fluid. Patient tolerated procedure well. No immediate complications. EBL = trace  Specimen was sent for labs.  Post procedure chest X-ray reveals no pneumothorax  Shaylyn Bawa S Linzee Depaul PA-C 11/16/2018 2:12 PM

## 2018-11-16 NOTE — ED Provider Notes (Addendum)
[facility] DEPT Provider Note   CSN: [id number] Arrival date & time: 11/16/18  5638     History   Chief Complaint Chief Complaint  Patient presents with  . Chest Pain    HPI Marilyn [name] is a 53 y.o. female.     Patient with past medical history remarkable for stage IV lung cancer on chemotherapy, last dose chemotherapy at the end of July, presents to the emergency department with a chief complaint of chest pain.  She states pain started yesterday.  States that it is worsened with deep breathing.  She denies shortness of breath or new cough.  She denies any fevers or chills.  She did not take anything for symptoms.  Additionally, she also complains of a draining abscess on her right thigh and some skin breakdown on her low back.  The history is provided by the patient. No language interpreter was used.    Past Medical History:  Diagnosis Date  . Arthritis   . Asthma   . Diabetes mellitus    Type II  . Hemorrhoids   . Hypercholesteremia   . Hypertension   . Hypothyroidism   . Metastatic lung cancer (metastasis from lung to other site) (HCC)    Lung, Mets to skin- right flank. CT angio 2019 suspicious for axillary, external iliac, right inguinal mets  . 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 NICM (nonischemic cardiomyopathy) ([place])    a. mild - EF 45-50% by echo 10/2016 but EF 52% by low risk nuc 11/2016. b. Echo 07/2017 - EF normal, mild AI, unremarkable echo otherwise.  . Obese   . Pneumonia 2017  . Sinus tachycardia    a. dates back to at least 2013, etiology unclear.    Patient Active Problem List   Diagnosis Date Noted  . Acute lower UTI 04/30/2018  . Obese 04/30/2018  . Degenerative cervical spinal stenosis 04/30/2018  . HLD (hyperlipidemia) 04/29/2018  . Asthma 04/29/2018  . Depression 04/29/2018  . Chronic diastolic CHF (congestive heart failure) ([place]) 04/29/2018  . Left arm weakness and numbness 04/29/2018   . Right arm weakness 04/29/2018  . Right sided numbness   . 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 Counseling regarding advance care planning and goals of care 01/03/2018  . Essential hypertension   . DCM (dilated cardiomyopathy) ([place])   . Non-ischemic cardiomyopathy ([place]) 02/04/2017  . Metastasis to skin ([place]) 12/02/2016  . Acute systolic heart failure ([place]) 12/01/2016  . Primary cancer of right lung metastatic to other site ([place]) 11/24/2016  . SOB (shortness of breath) 10/26/2016  . Hypothyroidism 08/22/2016  . PICC (peripherally inserted central catheter) flush 07/16/2016  . Community acquired pneumonia   . Sinus tachycardia   . Thrush of mouth and esophagus ([place]) 04/07/2016  . Constipation 04/07/2016  . Palliative care by specialist   . Port catheter in place 01/19/2016  . Dehydration 01/14/2016  . Tachycardia 01/14/2016  . Adenocarcinoma of left lung, stage 3 ([place]) 01/01/2016  . Non-small cell lung cancer ([place]) 12/31/2015  . Mediastinal mass   . Chest pain 12/26/2015  . Headache 12/26/2015  . Diabetes mellitus without complication ([place]) 75/64/3329    Past Surgical History:  Procedure Laterality Date  . CESAREAN SECTION    . COLONOSCOPY    . I/D  arm Right 2013  . IR FLUORO GUIDE PORT INSERTION RIGHT  02/18/2017  . IR GENERIC HISTORICAL  01/08/2016  IR US GUIDE VASC ACCESS RIGHT 01/08/2016 [name], DO [facility]  . IR GENERIC HISTORICAL  01/08/2016   IR FLUORO GUIDE CV LINE RIGHT 01/08/2016 [name], DO [facility]  . IR THORACENTESIS ASP PLEURAL SPACE W/IMG GUIDE  05/25/2018  . IR US GUIDE VASC ACCESS RIGHT  02/18/2017  . MULTIPLE EXTRACTIONS WITH ALVEOLOPLASTY N/A 03/25/2017   Procedure: MULTIPLE EXTRACTION WITH ALVEOLOPLASTY (teeth #s four, six, seven, eight, nine, ten, eleven, one, twenty-three, twenty-four, twenty-five, twenty-six, twenty-nine, thirty);  Surgeon: [name], [name];  Location: [facility];  Service: Oral  Surgery;  Laterality: N/A;  . TUBAL LIGATION    . US guided core needle biopsy     of right lower neck/supraclavicular lymph nodes     OB History   No obstetric history on file.      Home Medications    Prior to Admission medications   Medication Sig Start Date End Date Taking? Authorizing Provider  ACCU-CHEK AVIVA PLUS test strip CHECK BLOOD SUGAR THREE TIMES A DAY BEFORE MEALS DIRECTED 06/15/18   [provider]  amoxicillin-clavulanate (AUGMENTIN) 875-125 MG tablet Take 1 tablet by mouth 2 (two) times daily. 11/07/18   [name], MD  ASPIRIN LOW DOSE 81 MG EC tablet TAKE 1 TABLET BY MOUTH EVERY DAY Patient taking differently: Take 81 mg by mouth daily. Enteric Coated.  ** DO NOT CRUSH ** 03/19/18   [name], MD  atorvastatin (LIPITOR) 40 MG tablet Take 1 tablet (40 mg total) by mouth daily at 6 PM. 04/30/18   Cristal Deer, MD  clotrimazole-betamethasone (LOTRISONE) cream Apply 1 application topically 2 (two) times daily. To areas of skin rash 09/20/18   [name], MD  dexamethasone (DECADRON) 4 MG tablet Take 1 tab two times a day the day before Alimta chemo. Take 2 tabs two times a day starting the day after chemo for 3 days. 09/20/18   [name], MD  DULoxetine (CYMBALTA) 60 MG capsule TAKE 1 CAPSULE BY MOUTH EVERY DAY Patient taking differently: Take 60 mg by mouth daily.  12/13/17   [name], MD  fluconazole (DIFLUCAN) 200 MG tablet Take 1 tablet (200 mg total) by mouth daily. 03/06/18   [name], MD  folic acid (FOLVITE) 1 MG tablet Take 1 tablet (1 mg total) by mouth daily. Start 5-7 days before Alimta chemotherapy. Continue until 21 days after Alimta completed. 09/20/18   [name], MD  glucose monitoring kit (FREESTYLE) monitoring kit 1 each by Does not apply route as needed for other. 11/05/18   [name], MD  hydrOXYzine (ATARAX/VISTARIL) 10 MG tablet Take 1 tablet (10 mg total) by  mouth 3 (three) times daily as needed for itching. 04/26/18   [name], MD  insulin aspart (NOVOLOG) 100 UNIT/ML FlexPen As per sliding scale instructions Patient taking differently: Inject 0-10 Units into the skin 3 (three) times daily. As per sliding scale instructions 01/16/16   [name], MD  Insulin Degludec (TRESIBA FLEXTOUCH) 200 UNIT/ML SOPN Inject 100 Units into the skin daily before breakfast.     [provider]  levalbuterol (XOPENEX HFA) 45 MCG/ACT inhaler Inhale 1-2 puffs into the lungs every 8 (eight) hours as needed for wheezing. 04/03/18   Gardenia Phlegm, NP  levothyroxine (SYNTHROID) 150 MCG tablet TAKE 1 TABLET BY MOUTH EVERY DAY BEFORE BREAKFAST 10/26/18   [name], MD  lidocaine-prilocaine (EMLA) cream Apply 1 application topically as needed. 08/23/18  [name], MD  lidocaine-prilocaine (EMLA) cream Apply to affected area once 09/20/18   [name], MD  lidocaine-prilocaine (EMLA) cream Apply to affected area once 09/20/18   [name], MD  LORazepam (ATIVAN) 0.5 MG tablet Take 1 tablet (0.5 mg total) by mouth every 6 (six) hours as needed (Nausea or vomiting). 10/25/18   [name], MD  magnesium oxide (MAG-OX) 400 (241.3 Mg) MG tablet Take 1 tablet (400 mg total) by mouth 2 (two) times daily. 10/23/18   [name], MD  metoprolol succinate (TOPROL-XL) 50 MG 24 hr tablet Take 1 tablet (28m) by mouth in the morning and 1/2 tablet (287m in the evening. Take with or immediately following a meal. 08/10/18   MeAlmyra DeforestPA  naproxen (NAPROSYN) 500 MG tablet Take 1 tablet (500 mg total) by mouth 2 (two) times daily with a meal. Patient not taking: Reported on 08/10/2018 06/21/17   NaVarney BilesMD  nitrofurantoin, macrocrystal-monohydrate, (MACROBID) 100 MG capsule Take 1 capsule (100 mg total) by mouth every 12 (twelve) hours. Patient not taking: Reported on 08/10/2018 04/30/18   UgCristal DeerMD  omeprazole (PRILOSEC) 20 MG capsule Take by mouth daily. 06/13/18   [provider]  ondansetron (ZOFRAN) 8 MG tablet Take 1 tablet (8 mg total) by mouth 2 (two) times daily as needed for refractory nausea / vomiting. Start on day 3 after chemo. 09/20/18   KaBrunetta GeneraMD  oxyCODONE-acetaminophen (PERCOCET) 5-325 MG tablet Take 1 tablet by mouth every 6 (six) hours as needed for moderate pain or severe pain. 04/26/18   KaBrunetta GeneraMD  pregabalin (LYRICA) 50 MG capsule Take 50 mg by mouth 3 (three) times daily.    [provider]  prochlorperazine (COMPAZINE) 10 MG tablet Take 1 tablet (10 mg total) by mouth every 6 (six) hours as needed (Nausea or vomiting). 10/25/18   KaBrunetta GeneraMD  senna-docusate (SENNA S) 8.6-50 MG tablet Take 2 tablets by mouth 2 (two) times daily. May reduce to 2 tab po HS as needed once regular BM estabished Patient taking differently: Take 2 tablets by mouth 2 (two) times daily as needed (FOR CONSTIPATION). = 05/24/16   KaBrunetta GeneraMD  traZODone (DESYREL) 50 MG tablet Take 1-2 tablets (50-100 mg total) by mouth at bedtime as needed for sleep. Patient not taking: Reported on 08/10/2018 05/16/17   KaBrunetta GeneraMD  Vitamins A & D (VITAMIN A & D) OINT Apply 1 application topically 2 (two) times daily. 03/06/18   KaBrunetta GeneraMD    Family History Family History  Problem Relation Age of Onset  . Heart failure Mother   . Hypertension Mother   . Cancer Neg Hx   . Rheumatologic disease Neg Hx     Social History Social History   Tobacco Use  . Smoking status: Never Smoker  . Smokeless tobacco: Never Used  Substance Use Topics  . Alcohol use: No  . Drug use: No     Allergies   Patient has no known allergies.   Review of Systems Review of Systems  All other systems reviewed and are negative.    Physical Exam Updated Vital Signs BP (!) 149/95 (BP Location: Left Arm)   Pulse (!)  117   Temp 98.3 F (36.8 C) (Oral)   Resp 16   LMP 01/21/2016 Comment: pt signed preg test waiver 04/16/16   SpO2 98%   Physical Exam Vitals signs and  nursing note reviewed.  Constitutional:      General: She is not in acute distress.    Appearance: She is well-developed.  HENT:     Head: Normocephalic and atraumatic.  Eyes:     Conjunctiva/sclera: Conjunctivae normal.  Neck:     Musculoskeletal: Neck supple.  Cardiovascular:     Rate and Rhythm: Normal rate and regular rhythm.     Heart sounds: No murmur.  Pulmonary:     Effort: Pulmonary effort is normal. No respiratory distress.     Breath sounds: Normal breath sounds.  Abdominal:     Palpations: Abdomen is soft.     Tenderness: There is no abdominal tenderness.  Skin:    General: Skin is warm and dry.     Comments: Minor wound to right medial thigh, not draining, no surrounding erythema or cellulitis  Minor skin break down on low back/buttock  Neurological:     Mental Status: She is alert and oriented to person, place, and time.  Psychiatric:        Mood and Affect: Mood normal.        Behavior: Behavior normal.      ED Treatments / Results  Labs (all labs ordered are listed, but only abnormal results are displayed) Labs Reviewed  CBC WITH DIFFERENTIAL/PLATELET  BASIC METABOLIC PANEL  TROPONIN I (HIGH SENSITIVITY)    EKG None  Radiology No results found.  Procedures Procedures (including critical care time)  Medications Ordered in ED Medications - No data to display   Initial Impression / Assessment and Plan / ED Course  I have reviewed the triage vital signs and the nursing notes.  Pertinent labs & imaging results that were available during my care of the patient were reviewed by me and considered in my medical decision making (see chart for details).        Patient with stage IV lung cancer presents with chest pain that started yesterday.  Pain is pleuritic.  She is noted to be tachycardic  to the 110s.  She is not hypoxic.  She denies fevers or cough.  She is high risk for PE given her cancer history.  Will check CT PE study.  We will also check troponins.  Patient signed out to [place], [place], who will continue care.  Final Clinical Impressions(s) / ED Diagnoses   Final diagnoses:  None    ED Discharge Orders    None       Montine Circle, PA-C 11/16/18 0651    Montine Circle, PA-C 11/16/18 0656    Ward, Delice Bison, DO 11/16/18 0700

## 2018-11-16 NOTE — Progress Notes (Signed)
Patient reports sharp chest pain with deep breath 9/10.  Patient reports this is new.  Dr. Florene Glen paged and notified - coming to see patient.  Orders to follow.

## 2018-11-16 NOTE — ED Triage Notes (Addendum)
Per EMS:  Pt coming from home with c/o chest pain that started yesterday afternoon. The pts chest pain does not radiate but worsens whenever she takes a deep breath. Pt has a hx of stage 4 lung cancer and her last chemo was on 11/01/2018. Pt has a hx of diabetes also.  EMS Vitals: BP 177/124  HR 120 RR 20 SPO2 99% 96.8 temp   cbg 186

## 2018-11-16 NOTE — H&P (Addendum)
History and Physical    Marilyn [name]:[id number] DOB: July 18, 1965 DOA: 11/16/2018  PCP: Marilyn Providence, FNP  Patient coming from: home  I have personally briefly reviewed patient's old medical records in [facility]  Chief Complaint: chest pain  HPI: Marilyn [name] is Marilyn [name] 53 y.o. female with medical history significant of metastatic lung cancer on chemotherapy, T2DM, HTN, hypothyroidism, tachycardia, hypothyroidism, and multiple other medical problems presenting with chest pain.  She notes her symptoms started around 11:30 AM yesterday.  It's described as constant pain.  Located in center of chest.  Worse with deep breathing.  She notes shortness of breath as well.  She denies fevers.  She notes occasional cough and chills.  She denies abdominal pain.  She notes nausea.  Denies diarrhea.  She denies any sick contacts.  Denies smoking, drinking, other drugs.    ED Course: Labs, imaging, EKG.  CT chest notable for effusion.  Admit for CP r/o and pleural effusion.  Review of Systems: As per HPI otherwise 10 point review of systems negative.   Past Medical History:  Diagnosis Date  . Arthritis   . Asthma   . Diabetes mellitus    Type II  . Hemorrhoids   . Hypercholesteremia   . Hypertension   . Hypothyroidism   . Metastatic lung cancer (metastasis from lung to other site) (HCC)    Lung, Mets to skin- right flank. CT angio 2019 suspicious for axillary, external iliac, right inguinal mets  . 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 NICM (nonischemic cardiomyopathy) (HCC)    Marilyn [name]. mild - EF 45-50% by echo 10/2016 but EF 52% by low risk nuc 11/2016. b. Echo 07/2017 - EF normal, mild AI, unremarkable echo otherwise.  . Obese   . Pneumonia 2017  . Sinus tachycardia    Marilyn [name]. dates back to at least 2013, etiology unclear.    Past Surgical History:  Procedure Laterality Date  . CESAREAN SECTION    . COLONOSCOPY    . I/D  arm Right 2013  . IR FLUORO GUIDE PORT  INSERTION RIGHT  02/18/2017  . IR GENERIC [name]  01/08/2016   IR US GUIDE VASC ACCESS RIGHT 01/08/2016 Marilyn Mor, DO [facility]  . IR GENERIC [name]  01/08/2016   IR FLUORO GUIDE CV LINE RIGHT 01/08/2016 Marilyn Mor, DO [facility]  . IR THORACENTESIS ASP PLEURAL SPACE W/IMG GUIDE  05/25/2018  . IR US GUIDE VASC ACCESS RIGHT  02/18/2017  . MULTIPLE EXTRACTIONS WITH ALVEOLOPLASTY N/Marilyn [name] 03/25/2017   Procedure: MULTIPLE EXTRACTION WITH ALVEOLOPLASTY (teeth #s four, six, seven, eight, nine, ten, eleven, one, twenty-three, twenty-four, twenty-five, twenty-six, twenty-nine, thirty);  Surgeon: Marilyn [name], [name];  Location: [facility] OR;  Service: Oral Surgery;  Laterality: N/Marilyn [name];  . TUBAL LIGATION    . US guided core needle biopsy     of right lower neck/supraclavicular lymph nodes     reports that she has never smoked. She has never used smokeless tobacco. She reports that she does not drink alcohol or use drugs.  No Known Allergies  Family History  Problem Relation Age of Onset  . Heart failure Mother   . Hypertension Mother   . Cancer Neg Hx   . Rheumatologic disease Neg Hx    Prior to Admission medications   Medication Sig Start Date End Date Taking? Authorizing Provider  amoxicillin-clavulanate (AUGMENTIN) 875-125 MG tablet Take 1 tablet by mouth 2 (two) times daily. 11/07/18  Yes Johney Maine, MD  ASPIRIN LOW DOSE 81 MG EC tablet TAKE 1 TABLET BY MOUTH EVERY DAY Patient taking differently: Take 81 mg by mouth daily. Enteric Coated.  ** DO NOT CRUSH ** 03/19/18  Yes Johney Maine, MD  atorvastatin (LIPITOR) 40 MG tablet Take 1 tablet (40 mg total) by mouth daily at 6 PM. 04/30/18  Yes Marilyn Neither, MD  DULoxetine (CYMBALTA) 60 MG capsule TAKE 1 CAPSULE BY MOUTH EVERY DAY Patient taking differently: Take 60 mg by mouth daily.  12/13/17  Yes Johney Maine, MD  folic acid (FOLVITE) 1 MG tablet Take 1 tablet (1 mg total) by mouth daily. Start 5-7 days before Alimta  chemotherapy. Continue until 21 days after Alimta completed. 09/20/18  Yes Johney Maine, MD  hydrOXYzine (ATARAX/VISTARIL) 10 MG tablet Take 1 tablet (10 mg total) by mouth 3 (three) times daily as needed for itching. 04/26/18  Yes Johney Maine, MD  insulin aspart (NOVOLOG) 100 UNIT/ML FlexPen As per sliding scale instructions Patient taking differently: Inject 0-10 Units into the skin 3 (three) times daily. As per sliding scale instructions 01/16/16  Yes Johney Maine, MD  Insulin Degludec (TRESIBA FLEXTOUCH) 200 UNIT/ML SOPN Inject 100 Units into the skin daily before breakfast.    Yes [provider]  levalbuterol (XOPENEX HFA) 45 MCG/ACT inhaler Inhale 1-2 puffs into the lungs every 8 (eight) hours as needed for wheezing. 04/03/18  Yes Marilyn [name], Marilyn Daughters, NP  levothyroxine (SYNTHROID) 150 MCG tablet TAKE 1 TABLET BY MOUTH EVERY DAY BEFORE BREAKFAST Patient taking differently: Take 150 mcg by mouth daily before breakfast.  10/26/18  Yes Johney Maine, MD  lidocaine-prilocaine (EMLA) cream Apply 1 application topically as needed. Patient taking differently: Apply 1 application topically as needed (port access).  08/23/18  Yes Johney Maine, MD  LORazepam (ATIVAN) 0.5 MG tablet Take 1 tablet (0.5 mg total) by mouth every 6 (six) hours as needed (Nausea or vomiting). 10/25/18  Yes Johney Maine, MD  magnesium oxide (MAG-OX) 400 (241.3 Mg) MG tablet Take 1 tablet (400 mg total) by mouth 2 (two) times daily. 10/23/18  Yes Johney Maine, MD  metoprolol succinate (TOPROL-XL) 50 MG 24 hr tablet Take 1 tablet (50mg ) by mouth in the morning and 1/2 tablet (25mg ) in the evening. Take with or immediately following Mercedies Ganesh meal. Patient taking differently: Take 25-50 mg by mouth 2 (two) times daily. Take 50mg  by mouth in the morning and 25mg  in the evening. Take with or immediately following Keiden Deskin meal. 08/10/18  Yes Marilyn [name], Wagoner, PA  ondansetron (ZOFRAN) 8 MG tablet  Take 1 tablet (8 mg total) by mouth 2 (two) times daily as needed for refractory nausea / vomiting. Start on day 3 after chemo. 09/20/18  Yes Johney Maine, MD  traZODone (DESYREL) 50 MG tablet Take 1-2 tablets (50-100 mg total) by mouth at bedtime as needed for sleep. 05/16/17  Yes Johney Maine, MD  Vitamins Berania Peedin & D (VITAMIN Colisha Redler & D) OINT Apply 1 application topically 2 (two) times daily. Patient taking differently: Apply 1 application topically 2 (two) times daily as needed (itching).  03/06/18  Yes Johney Maine, MD  ACCU-CHEK AVIVA PLUS test strip CHECK BLOOD SUGAR THREE TIMES Vahe Pienta DAY BEFORE MEALS DIRECTED 06/15/18   [provider]  clotrimazole-betamethasone (LOTRISONE) cream Apply 1 application topically 2 (two) times daily. To areas of skin rash Patient not taking: Reported on 11/16/2018 09/20/18   Johney Maine,  MD  dexamethasone (DECADRON) 4 MG tablet Take 1 tab two times Dennie Moltz day the day before Alimta chemo. Take 2 tabs two times Christie Copley day starting the day after chemo for 3 days. Patient taking differently: Take 4-8 mg by mouth See admin instructions. Take 4 mg twice daily the day before Alimta chemo, then take 8 mg twice daily starting the day after chemo for 3 days. 09/20/18   Johney Maine, MD  fluconazole (DIFLUCAN) 200 MG tablet Take 1 tablet (200 mg total) by mouth daily. Patient not taking: Reported on 11/16/2018 03/06/18   Johney Maine, MD  glucose monitoring kit (FREESTYLE) monitoring kit 1 each by Does not apply route as needed for other. 11/05/18   Johney Maine, MD  lidocaine-prilocaine (EMLA) cream Apply to affected area once Patient not taking: Reported on 11/16/2018 09/20/18   Johney Maine, MD  lidocaine-prilocaine (EMLA) cream Apply to affected area once Patient not taking: Reported on 11/16/2018 09/20/18   Johney Maine, MD  naproxen (NAPROSYN) 500 MG tablet Take 1 tablet (500 mg total) by mouth 2 (two) times daily with Olof Marcil  meal. Patient not taking: Reported on 08/10/2018 06/21/17   Derwood Kaplan, MD  nitrofurantoin, macrocrystal-monohydrate, (MACROBID) 100 MG capsule Take 1 capsule (100 mg total) by mouth every 12 (twelve) hours. Patient not taking: Reported on 08/10/2018 04/30/18   Marilyn Neither, MD  oxyCODONE-acetaminophen (PERCOCET) 5-325 MG tablet Take 1 tablet by mouth every 6 (six) hours as needed for moderate pain or severe pain. Patient not taking: Reported on 11/16/2018 04/26/18   Johney Maine, MD  prochlorperazine (COMPAZINE) 10 MG tablet Take 1 tablet (10 mg total) by mouth every 6 (six) hours as needed (Nausea or vomiting). Patient not taking: Reported on 11/16/2018 10/25/18   Johney Maine, MD  senna-docusate (SENNA S) 8.6-50 MG tablet Take 2 tablets by mouth 2 (two) times daily. May reduce to 2 tab po HS as needed once regular BM estabished Patient not taking: Reported on 11/16/2018 05/24/16   Johney Maine, MD    Physical Exam: Vitals:   11/16/18 1240 11/16/18 1246 11/16/18 1253 11/16/18 1255  BP: (!) 144/89 (!) 151/103 (!) 158/96 139/89  Pulse:  (!) 109    Resp:  18    Temp:      TempSrc:      SpO2:  100%      Constitutional: NAD, calm, comfortable Vitals:   11/16/18 1240 11/16/18 1246 11/16/18 1253 11/16/18 1255  BP: (!) 144/89 (!) 151/103 (!) 158/96 139/89  Pulse:  (!) 109    Resp:  18    Temp:      TempSrc:      SpO2:  100%     Eyes: PERRL, lids and conjunctivae normal ENMT: Mucous membranes are moist. Posterior pharynx clear of any exudate or lesions.Normal dentition.  Neck: normal, supple, no masses, no thyromegaly Respiratory: clear to auscultation bilaterally, no wheezing, no crackles. .  Cardiovascular: Regular rate and rhythm, no murmurs / rubs / gallops. No extremity edema.   Abdomen: no tenderness, no masses palpated. No hepatosplenomegaly. Bowel sounds positive.  Musculoskeletal: no clubbing / cyanosis. No joint deformity upper and lower extremities.  Good ROM, no contractures. Normal muscle tone.  Skin: RLE with draining ulceration Neurologic: CN 2-12 grossly intact. Sensation intact, DTR normal. Strength 5/5 in all 4.  Psychiatric: Normal judgment and insight. Alert and oriented x 3. Normal mood.   Labs on Admission: I have personally reviewed following labs and  imaging studies  CBC: Recent Labs  Lab 11/16/18 0642  WBC 10.4  NEUTROABS 8.6*  HGB 8.7*  HCT 27.4*  MCV 92.3  PLT 73*   Basic Metabolic Panel: Recent Labs  Lab 11/16/18 0642  NA 138  K 4.5  CL 104  CO2 24  GLUCOSE 178*  BUN 8  CREATININE 0.90  CALCIUM 8.9  MG 1.6*   GFR: CrCl cannot be calculated (Unknown ideal weight.). Liver Function Tests: No results for input(s): AST, ALT, ALKPHOS, BILITOT, PROT, ALBUMIN in the last 168 hours. No results for input(s): LIPASE, AMYLASE in the last 168 hours. No results for input(s): AMMONIA in the last 168 hours. Coagulation Profile: No results for input(s): INR, PROTIME in the last 168 hours. Cardiac Enzymes: No results for input(s): CKTOTAL, CKMB, CKMBINDEX, TROPONINI in the last 168 hours. BNP (last 3 results) No results for input(s): PROBNP in the last 8760 hours. HbA1C: No results for input(s): HGBA1C in the last 72 hours. CBG: No results for input(s): GLUCAP in the last 168 hours. Lipid Profile: No results for input(s): CHOL, HDL, LDLCALC, TRIG, CHOLHDL, LDLDIRECT in the last 72 hours. Thyroid Function Tests: No results for input(s): TSH, T4TOTAL, FREET4, T3FREE, THYROIDAB in the last 72 hours. Anemia Panel: No results for input(s): VITAMINB12, FOLATE, FERRITIN, TIBC, IRON, RETICCTPCT in the last 72 hours. Urine analysis:    Component Value Date/Time   COLORURINE YELLOW 04/29/2018 0419   APPEARANCEUR HAZY (Afifa Truax) 04/29/2018 0419   LABSPEC 1.025 04/29/2018 0419   PHURINE 5.0 04/29/2018 0419   GLUCOSEU 50 (Dealie Koelzer) 04/29/2018 0419   HGBUR MODERATE (Nathanel Tallman) 04/29/2018 0419   BILIRUBINUR NEGATIVE 04/29/2018 0419    KETONESUR NEGATIVE 04/29/2018 0419   PROTEINUR <30 10/11/2018 1510   UROBILINOGEN 0.2 02/01/2013 2022   NITRITE POSITIVE (Theadore Blunck) 04/29/2018 0419   LEUKOCYTESUR MODERATE (Zori Benbrook) 04/29/2018 0419    Radiological Exams on Admission: Dg Chest 1 View  Result Date: 11/16/2018 CLINICAL DATA:  Post right thoracentesis. History of stage IV lung cancer. EXAM: CHEST  1 VIEW COMPARISON:  09/15/2018 and chest CT 11/16/2018. FINDINGS: Volume loss in the right hemithorax. Hazy densities at the right lung base may represent atelectasis. Persistent densities in the right hilar region compatible with areas of consolidation or volume loss. Right hilar densities are unchanged. Negative for right pneumothorax. Lucency lateral to the right hilar soft tissue is similar to the prior chest radiograph. Left lung is clear. Heart and mediastinum are stable. IMPRESSION: 1. Negative for pneumothorax following the right thoracentesis. 2. Persistent volume loss in the right hemithorax with stable densities in the right hilar region. Electronically Signed   By: Richarda Overlie M.D.   On: 11/16/2018 13:32   Ct Angio Chest Pe W And/or Wo Contrast  Result Date: 11/16/2018 CLINICAL DATA:  Stage IV lung carcinoma. Chest pain and shortness of breath EXAM: CT ANGIOGRAPHY CHEST WITH CONTRAST TECHNIQUE: Multidetector CT imaging of the chest was performed using the standard protocol during bolus administration of intravenous contrast. Multiplanar CT image reconstructions and MIPs were obtained to evaluate the vascular anatomy. CONTRAST:  OMNIPAQUE IOHEXOL 350 MG/ML SOLN COMPARISON:  September 14, 2018 FINDINGS: Cardiovascular: There is no demonstrable pulmonary embolus. There is no thoracic aortic aneurysm or dissection. Visualized great vessels appear unremarkable. There is no pericardial effusion or pericardial thickening. [place]-Cath tip is in the superior vena cava. Mediastinum/Nodes: Visualized thyroid appears unremarkable. There remains axillary  adenopathy on the right. The largest lymph node in the right axillary region measures 3.4 x 2.5  cm, similar to prior study. There is left supraclavicular lymph node enlargement medially with focal lymph node in this area measuring 2.3 x 1.9 cm, essentially stable. Right paratracheal adenopathy is also present with index lymph node in this area measuring 2.0 x 1.7 cm, essentially stable. No new adenopathy evident. No esophageal lesions appreciable. Lungs/Pleura: There is Jahira Swiss sizable free-flowing pleural effusion on the right. The right perihilar region post radiation therapy change is stable. There is atelectatic change in this area as well. The contour of this area is stable compared to most recent CT. No well-defined mass seen in this area. Left lung is clear. Upper Abdomen: Visualized upper abdominal structures appear unremarkable. Musculoskeletal: No blastic or lytic bone lesions. Diffuse skin thickening in the right breast region is stable. Review of the MIP images confirms the above findings. IMPRESSION: 1. No demonstrable pulmonary embolus. No thoracic aortic aneurysm or dissection. 2. Sizable free-flowing pleural effusion on the right. Post radiation therapy change in the right perihilar region is stable. There is atelectasis in this area but no well-defined mass. Subtle neoplasm in this area is difficult to exclude, although the appearance is stable compared to most recent CT. 3. Stable adenopathy in the right axillary, left supraclavicular, and right paratracheal regions. 4.  Persistent skin thickening right breast. Electronically Signed   By: Bretta Bang III M.D.   On: 11/16/2018 08:18    EKG: Independently reviewed. Sinus tach.  Appears similar to priors.  Assessment/Plan Active Problems:   Chest pain  Atypical Chest Pain  Right Sided Pleural Effusion: EKG with sinus tach, but appears consistent with priors.  CP seems pleuritic.  She has TTP of chest, but notes this is different.  High  sensitivity troponin is minimally elevated.  Delta was collected, but has not resulted yet.  She reported improved CP after thoracentesis, but shortly after described pleuritic CP again.  Of note, per Dr. Candise Che most recent note, she has hx of costochondritis causing chest pain as well. Follow troponin (pending) EKG's stable, notable for sinus tach.  Without concerning changes for ischemia.  CTA without PE, but was notable for free flowing effusion on R Now s/p thora by IR - follow cytology. Gram stain without organisms.  Elevated LDH, exudative by lights. She's on abx which would cover possible pneumonia Of note, she noted initial improvement after thora, but then described pleuritic CP again which worsened.  Follow repeat CXR, follow troponin, follow EKG (reviewed and stable).  Continue to monitor.  Follow echo.  Metastatic Lung Adenocarcinoma: currently receiving carboplatin/alimta/avastin.  Follows with Dr. Candise Che.  Had malignant effusion in 09/2018. Dr. Candise Che aware of admission Per CT, "subtle neoplasm difficult to exclude" in area with post radiation change in R perihilar region.  Follow with Dr. Candise Che outpatient.  Right thigh abscess  Gluteal Cleft Wound: pt will cellulitis/abscess of inner thigh which spontaneously drained.  pt has been on augmentin for several days.  Will continue augmentin for now and add doxy.  Follow [place] to ensure no deep abscess that requires drainage. Wound care c/s  Sinus tachy: noted on EKG.  Per chart, dates back to 2013 with unclear etiology.  Continue metoprolol.   Hypothyroidism: continue synthroid  T2DM: SSI.  Continue long acting insulin with 70 units lantus daily (on 100 units tresiba at home)  Thrombocytopenia  Anemia: likely related to chemo, follow  DVT prophylaxis: lovenox  Code Status: full Family Communication: none at bedside, she declined me calling anyone, stated she'd update  Disposition Plan:  pending improvement  Consults called: none   Admission status: obs   Lacretia Nicks MD [facility] Pager AMION  If 7PM-7AM, please contact night-coverage [email or web address] Password [email or web address]  11/16/2018, 2:45 PM

## 2018-11-16 NOTE — ED Provider Notes (Signed)
Physical Exam  BP (!) 165/104   Pulse (!) 108   Temp 98.3 F (36.8 C) (Oral)   Resp (!) 21   LMP 01/21/2016 Comment: pt signed preg test waiver 04/16/16   SpO2 100%   Assumed care from [employer], PA-C at 0700. Briefly, the patient is a 53 y.o. female with PMHx of  has a past medical history of Arthritis, Asthma, Diabetes mellitus, Hemorrhoids, Hypercholesteremia, Hypertension, Hypothyroidism, Metastatic lung cancer (metastasis from lung to other site) [facility]), Neuropathy, NICM (nonischemic cardiomyopathy) ([place]), Obese, Pneumonia (2017), and Sinus tachycardia. here with pleuritic CP. Being ruled out for PE and awaiting troponin.   Labs Reviewed  CBC WITH DIFFERENTIAL/PLATELET - Abnormal; Notable for the following components:      Result Value   RBC 2.97 (*)    Hemoglobin 8.7 (*)    HCT 27.4 (*)    Platelets 73 (*)    Neutro Abs 8.6 (*)    Abs Immature Granulocytes 0.12 (*)    All other components within normal limits  BASIC METABOLIC PANEL - Abnormal; Notable for the following components:   Glucose, Bld 178 (*)    All other components within normal limits  TROPONIN I (HIGH SENSITIVITY) - Abnormal; Notable for the following components:   Troponin I (High Sensitivity) 28 (*)    All other components within normal limits  SARS CORONAVIRUS 2 (HOSPITAL ORDER, [facility])  MAGNESIUM  TROPONIN I (HIGH SENSITIVITY)    Course of Care:   Physical Exam Vitals signs and nursing note reviewed.  Constitutional:      General: She is not in acute distress.    Appearance: She is well-developed. She is not diaphoretic.     Comments: Sitting comfortably in bed.  HENT:     Head: Normocephalic and atraumatic.     Mouth/Throat:     Mouth: Mucous membranes are moist.  Eyes:     General:        Right eye: No discharge.        Left eye: No discharge.     Conjunctiva/sclera: Conjunctivae normal.     Comments: EOMs normal to gross examination.  Neck:   Musculoskeletal: Normal range of motion.  Cardiovascular:     Rate and Rhythm: Normal rate and regular rhythm.     Comments: Intact, 2+ radial pulse. Pulmonary:     Breath sounds: Examination of the right-lower field reveals decreased breath sounds. Decreased breath sounds present.     Comments: Slight tachypnea. Abdominal:     General: There is no distension.  Musculoskeletal: Normal range of motion.  Skin:    General: Skin is warm and dry.  Neurological:     Mental Status: She is alert.     Comments: Cranial nerves intact to gross observation. Patient moves extremities without difficulty.  Psychiatric:        Behavior: Behavior normal.        Thought Content: Thought content normal.        Judgment: Judgment normal.     ED Course/Procedures   Clinical Course as of Nov 15 1048  Thu Nov 16, 2018  1047 Spoke with Dr. Florene Glen who will admit pt. Appreciate his involvement.    [AM]    Clinical Course User Index [AM] Albesa Seen, PA-C    Procedures  MDM   Spoke with cards master who feels the patient can stay at [facility] long for further management and chest pain rule out and cards can be  consulted if patient rules in.  Chest pain felt more likely secondary to pleural effusion at this time.  Case was discussed with interventional radiology who states that thoracentesis order can be placed.    Troponin returned at 28.  EKG nonischemic.  Felt less likely to be ACS however with heart score of 4, elevation in troponin, would recommend observation.  Delta troponin is pending.  CTPA without PE but does show a moderate right-sided pleural effusion.  Patient has had therapeutic thoracentesis before.  Dr. Florene Glen of [facility] to admit. Appreciate his involvement.    Albesa Seen, PA-C 11/16/18 1052    Pattricia Boss, MD 11/16/18 1517

## 2018-11-17 ENCOUNTER — Observation Stay (HOSPITAL_BASED_OUTPATIENT_CLINIC_OR_DEPARTMENT_OTHER): Payer: Medicare Other

## 2018-11-17 DIAGNOSIS — J910 Malignant pleural effusion: Secondary | ICD-10-CM | POA: Diagnosis not present

## 2018-11-17 DIAGNOSIS — L8990 Pressure ulcer of unspecified site, unspecified stage: Secondary | ICD-10-CM | POA: Insufficient documentation

## 2018-11-17 DIAGNOSIS — R079 Chest pain, unspecified: Secondary | ICD-10-CM | POA: Diagnosis not present

## 2018-11-17 DIAGNOSIS — I351 Nonrheumatic aortic (valve) insufficiency: Secondary | ICD-10-CM

## 2018-11-17 DIAGNOSIS — J90 Pleural effusion, not elsewhere classified: Secondary | ICD-10-CM | POA: Diagnosis not present

## 2018-11-17 LAB — CBC
HCT: 25.1 % — ABNORMAL LOW (ref 36.0–46.0)
Hemoglobin: 7.9 g/dL — ABNORMAL LOW (ref 12.0–15.0)
MCH: 29.3 pg (ref 26.0–34.0)
MCHC: 31.5 g/dL (ref 30.0–36.0)
MCV: 93 fL (ref 80.0–100.0)
Platelets: 81 10*3/uL — ABNORMAL LOW (ref 150–400)
RBC: 2.7 MIL/uL — ABNORMAL LOW (ref 3.87–5.11)
RDW: 13.4 % (ref 11.5–15.5)
WBC: 9.6 10*3/uL (ref 4.0–10.5)
nRBC: 0 % (ref 0.0–0.2)

## 2018-11-17 LAB — COMPREHENSIVE METABOLIC PANEL
ALT: 25 U/L (ref 0–44)
AST: 22 U/L (ref 15–41)
Albumin: 2.9 g/dL — ABNORMAL LOW (ref 3.5–5.0)
Alkaline Phosphatase: 118 U/L (ref 38–126)
Anion gap: 9 (ref 5–15)
BUN: 8 mg/dL (ref 6–20)
CO2: 24 mmol/L (ref 22–32)
Calcium: 8.9 mg/dL (ref 8.9–10.3)
Chloride: 104 mmol/L (ref 98–111)
Creatinine, Ser: 0.72 mg/dL (ref 0.44–1.00)
GFR calc Af Amer: >60 mL/min (ref >60)
GFR calc non Af Amer: >60 mL/min (ref >60)
Glucose, Bld: 183 mg/dL — ABNORMAL HIGH (ref 70–99)
Potassium: 4.3 mmol/L (ref 3.5–5.1)
Sodium: 137 mmol/L (ref 135–145)
Total Bilirubin: 0.3 mg/dL (ref 0.3–1.2)
Total Protein: 6.9 g/dL (ref 6.5–8.1)

## 2018-11-17 LAB — GLUCOSE, CAPILLARY
Glucose-Capillary: 109 mg/dL — ABNORMAL HIGH (ref 70–99)
Glucose-Capillary: 127 mg/dL — ABNORMAL HIGH (ref 70–99)
Glucose-Capillary: 163 mg/dL — ABNORMAL HIGH (ref 70–99)
Glucose-Capillary: 215 mg/dL — ABNORMAL HIGH (ref 70–99)

## 2018-11-17 LAB — PH, BODY FLUID: pH, Body Fluid: 7.4

## 2018-11-17 LAB — PROCALCITONIN: Procalcitonin: <0.1 ng/mL

## 2018-11-17 LAB — ECHOCARDIOGRAM COMPLETE

## 2018-11-17 MED ORDER — OXYCODONE HCL 5 MG PO TABS: 5.0000 mg | ORAL_TABLET | ORAL | 0 refills | Status: DC | PRN

## 2018-11-17 MED ORDER — MUPIROCIN 2 % EX OINT
TOPICAL_OINTMENT | Freq: Two times a day (BID) | CUTANEOUS | Status: DC
  Administered 2018-11-17: 17:00:00 via TOPICAL
  Filled 2018-11-17: qty 22

## 2018-11-17 MED ORDER — ZINC OXIDE 40 % EX OINT
TOPICAL_OINTMENT | Freq: Three times a day (TID) | CUTANEOUS | Status: DC
  Administered 2018-11-17: 17:00:00 via TOPICAL
  Filled 2018-11-17: qty 57

## 2018-11-17 MED ORDER — ZINC OXIDE 40 % EX OINT: TOPICAL_OINTMENT | Freq: Three times a day (TID) | CUTANEOUS | 0 refills | Status: DC

## 2018-11-17 MED ORDER — DOXYCYCLINE HYCLATE 100 MG PO TABS: 100.0000 mg | ORAL_TABLET | Freq: Two times a day (BID) | ORAL | 0 refills | Status: AC

## 2018-11-17 MED ORDER — HEPARIN SOD (PORK) LOCK FLUSH 100 UNIT/ML IV SOLN
500.0000 [IU] | INTRAVENOUS | Status: AC | PRN
  Administered 2018-11-17: 500 [IU]

## 2018-11-17 MED ORDER — MUPIROCIN 2 % EX OINT: TOPICAL_OINTMENT | Freq: Two times a day (BID) | CUTANEOUS | 0 refills | Status: DC

## 2018-11-17 NOTE — Discharge Summary (Signed)
Physician Discharge Summary  Marilyn [name] GNF:[id number] DOB: [birth year] DOA: 11/16/2018  PCP: [name], FNP  Admit date: 11/16/2018 Discharge date: 11/17/2018  Admitted From: Home Disposition:  Home  Recommendations for Outpatient Follow-up:  1. Follow up with PCP in 1-2 weeks  Thigh wound treatment recommendation: 2 % Mupirocin ointment twice daily. Apply into wound bed on right inner upper thigh.  Cover with a small foam dressing. Change the foam daily and prn  Discharge Condition:Stable CODE STATUS:Full Diet recommendation: Diabetic   Brief/Interim Summary: 53 y.o. female with medical history significant of metastatic lung cancer on chemotherapy, T2DM, HTN, hypothyroidism, tachycardia, hypothyroidism, and multiple other medical problems presenting with chest pain.  She notes her symptoms started around 11:30 AM yesterday.  It's described as constant pain.  Located in center of chest.  Worse with deep breathing.  She notes shortness of breath as well.  She denies fevers.  She notes occasional cough and chills.  She denies abdominal pain.  She notes nausea.  Denies diarrhea.  She denies any sick contacts.  Denies smoking, drinking, other drugs.    Discharge Diagnoses:  Active Problems:   Chest pain   Pleural effusion   Pressure injury of skin  Atypical Chest Pain  Right Sided Pleural Effusion:  -Presenting EKG with sinus tach, but appears consistent with priors.   -CP seems pleuritic.   -High sensitivity troponin is minimally elevated, remained stable -Pt reported improvement after thoracentesis, but shortly after described pleuritic CP again.  Of note, per Dr. Irene Limbo most recent note, she has hx of costochondritis causing chest pain as well. -2d echo reviewed, normal LVEF, unremarkable -Chest CT with findings consistent with prior radiation tx as well as skin tightening. Suspect contributing to presenting chest pains  Metastatic Lung Adenocarcinoma:  -currently  receiving carboplatin/alimta/avastin.  Follows with Dr. Irene Limbo.  Had malignant effusion in 09/2018. -Per CT, "subtle neoplasm difficult to exclude" in area with post radiation change in R perihilar region.  Follow with Dr. Irene Limbo outpatient.  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  Per chart, dates back to 2013 with unclear etiology.  Continue metoprolol.   Hypothyroidism: continue synthroid  T2DM: SSI while in hospital.  Continue long acting insulin with 70 units lantus daily while in hospital (on 100 units tresiba at home)  Thrombocytopenia  Chronic Anemia: likely related to chemo   Discharge Instructions   Allergies as of 11/17/2018   No Known Allergies     Medication List    STOP taking these medications   amoxicillin-clavulanate 875-125 MG tablet Commonly known as: AUGMENTIN   clotrimazole-betamethasone cream Commonly known as: Lotrisone   fluconazole 200 MG tablet Commonly known as: DIFLUCAN   oxyCODONE-acetaminophen 5-325 MG tablet Commonly known as: Percocet   prochlorperazine 10 MG tablet Commonly known as: COMPAZINE     TAKE these medications   Accu-Chek Aviva Plus test strip Generic drug: glucose blood CHECK BLOOD SUGAR THREE TIMES A DAY BEFORE MEALS DIRECTED   Aspirin Low Dose 81 MG EC tablet Generic drug: aspirin TAKE 1 TABLET BY MOUTH EVERY DAY What changed:   how much to take  additional instructions   atorvastatin 40 MG tablet Commonly known as: LIPITOR Take 1 tablet (40 mg total) by mouth daily at 6 PM.   dexamethasone 4 MG tablet Commonly known as: DECADRON Take 1 tab two times a day the day  before Alimta chemo. Take 2 tabs two times a day starting the day after chemo for 3 days. What changed:   how much to take  how to take this  when to take this  additional instructions   doxycycline 100 MG  tablet Commonly known as: VIBRA-TABS Take 1 tablet (100 mg total) by mouth every 12 (twelve) hours for 4 days.   DULoxetine 60 MG capsule Commonly known as: CYMBALTA TAKE 1 CAPSULE BY MOUTH EVERY DAY What changed: how much to take   folic acid 1 MG tablet Commonly known as: FOLVITE Take 1 tablet (1 mg total) by mouth daily. Start 5-7 days before Alimta chemotherapy. Continue until 21 days after Alimta completed.   glucose monitoring kit monitoring kit 1 each by Does not apply route as needed for other.   hydrOXYzine 10 MG tablet Commonly known as: ATARAX/VISTARIL Take 1 tablet (10 mg total) by mouth 3 (three) times daily as needed for itching.   insulin aspart 100 UNIT/ML FlexPen Commonly known as: NOVOLOG As per sliding scale instructions What changed:   how much to take  how to take this  when to take this   levalbuterol 45 MCG/ACT inhaler Commonly known as: XOPENEX HFA Inhale 1-2 puffs into the lungs every 8 (eight) hours as needed for wheezing.   levothyroxine 150 MCG tablet Commonly known as: SYNTHROID TAKE 1 TABLET BY MOUTH EVERY DAY BEFORE BREAKFAST What changed:   how much to take  how to take this  when to take this  additional instructions   lidocaine-prilocaine cream Commonly known as: EMLA Apply 1 application topically as needed. What changed: reasons to take this   lidocaine-prilocaine cream Commonly known as: EMLA Apply to affected area once What changed: Another medication with the same name was changed. Make sure you understand how and when to take each.   lidocaine-prilocaine cream Commonly known as: EMLA Apply to affected area once What changed: Another medication with the same name was changed. Make sure you understand how and when to take each.   liver oil-zinc oxide 40 % ointment Commonly known as: DESITIN Apply topically 4 (four) times daily -  with meals and at bedtime. Apply to coccyx/gluteal fold wound   LORazepam 0.5 MG  tablet Commonly known as: ATIVAN Take 1 tablet (0.5 mg total) by mouth every 6 (six) hours as needed (Nausea or vomiting).   magnesium oxide 400 (241.3 Mg) MG tablet Commonly known as: MAG-OX Take 1 tablet (400 mg total) by mouth 2 (two) times daily.   metoprolol succinate 50 MG 24 hr tablet Commonly known as: TOPROL-XL Take 1 tablet (37m) by mouth in the morning and 1/2 tablet (260m in the evening. Take with or immediately following a meal. What changed:   how much to take  how to take this  when to take this  additional instructions   mupirocin ointment 2 % Commonly known as: BACTROBAN Apply topically 2 (two) times daily. Apply into wound bed on right inner upper thigh.  Cover with a small foam dressing. Change the foam daily and prn   naproxen 500 MG tablet Commonly known as: Naprosyn Take 1 tablet (500 mg total) by mouth 2 (two) times daily with a meal.   nitrofurantoin (macrocrystal-monohydrate) 100 MG capsule Commonly known as: MACROBID Take 1 capsule (100 mg total) by mouth every 12 (twelve) hours.   ondansetron 8 MG tablet Commonly known as: Zofran Take 1 tablet (8 mg total) by mouth 2 (two) times daily as needed  for refractory nausea / vomiting. Start on day 3 after chemo.   oxyCODONE 5 MG immediate release tablet Commonly known as: Oxy IR/ROXICODONE Take 1 tablet (5 mg total) by mouth every 4 (four) hours as needed for severe pain.   senna-docusate 8.6-50 MG tablet Commonly known as: Senna S Take 2 tablets by mouth 2 (two) times daily. May reduce to 2 tab po HS as needed once regular BM estabished   traZODone 50 MG tablet Commonly known as: DESYREL Take 1-2 tablets (50-100 mg total) by mouth at bedtime as needed for sleep.   Tyler Aas FlexTouch 200 UNIT/ML Sopn Generic drug: Insulin Degludec Inject 100 Units into the skin daily before breakfast.   Vitamin A & D Oint Apply 1 application topically 2 (two) times daily. What changed:   when to take  this  reasons to take this      Follow-up Information    [name], FNP. Schedule an appointment as soon as possible for a visit in 1 week(s).   Specialty: Nurse Practitioner Contact information: [place] [address] [phone]        Pixie Casino, MD .   Specialty: Cardiology Contact information: [place] [address] [phone]        [name], MD Follow up.   Specialties: Hematology, Oncology Why: follow up as scheduled Contact information: [place] [address] [phone]          No Known Allergies  Procedures/Studies: Dg Chest 1 View  Result Date: 11/16/2018 CLINICAL DATA:  Post right thoracentesis. History of stage IV lung cancer. EXAM: CHEST  1 VIEW COMPARISON:  09/15/2018 and chest CT 11/16/2018. FINDINGS: Volume loss in the right hemithorax. Hazy densities at the right lung base may represent atelectasis. Persistent densities in the right hilar region compatible with areas of consolidation or volume loss. Right hilar densities are unchanged. Negative for right pneumothorax. Lucency lateral to the right hilar soft tissue is similar to the prior chest radiograph. Left lung is clear. Heart and mediastinum are stable. IMPRESSION: 1. Negative for pneumothorax following the right thoracentesis. 2. Persistent volume loss in the right hemithorax with stable densities in the right hilar region. Electronically Signed   By: Markus Daft M.D.   On: 11/16/2018 13:32   Ct Angio Chest Pe W And/or Wo Contrast  Result Date: 11/16/2018 CLINICAL DATA:  Stage IV lung carcinoma. Chest pain and shortness of breath EXAM: CT ANGIOGRAPHY CHEST WITH CONTRAST TECHNIQUE: Multidetector CT imaging of the chest was performed using the standard protocol during bolus administration of intravenous contrast. Multiplanar CT image reconstructions and MIPs were obtained to evaluate the  vascular anatomy. CONTRAST:  187m OMNIPAQUE IOHEXOL 350 MG/ML SOLN COMPARISON:  September 14, 2018 FINDINGS: Cardiovascular: There is no demonstrable pulmonary embolus. There is no thoracic aortic aneurysm or dissection. Visualized great vessels appear unremarkable. There is no pericardial effusion or pericardial thickening. Port-A-Cath tip is in the superior vena cava. Mediastinum/Nodes: Visualized thyroid appears unremarkable. There remains axillary adenopathy on the right. The largest lymph node in the right axillary region measures 3.4 x 2.5 cm, similar to prior study. There is left supraclavicular lymph node enlargement medially with focal lymph node in this area measuring 2.3 x 1.9 cm, essentially stable. Right paratracheal adenopathy is also present with index lymph node in this area measuring 2.0 x 1.7 cm, essentially stable. No new adenopathy evident. No esophageal lesions appreciable. Lungs/Pleura: There is  a sizable free-flowing pleural effusion on the right. The right perihilar region post radiation therapy change is stable. There is atelectatic change in this area as well. The contour of this area is stable compared to most recent CT. No well-defined mass seen in this area. Left lung is clear. Upper Abdomen: Visualized upper abdominal structures appear unremarkable. Musculoskeletal: No blastic or lytic bone lesions. Diffuse skin thickening in the right breast region is stable. Review of the MIP images confirms the above findings. IMPRESSION: 1. No demonstrable pulmonary embolus. No thoracic aortic aneurysm or dissection. 2. Sizable free-flowing pleural effusion on the right. Post radiation therapy change in the right perihilar region is stable. There is atelectasis in this area but no well-defined mass. Subtle neoplasm in this area is difficult to exclude, although the appearance is stable compared to most recent CT. 3. Stable adenopathy in the right axillary, left supraclavicular, and right paratracheal  regions. 4.  Persistent skin thickening right breast. Electronically Signed   By: Lowella Grip III M.D.   On: 11/16/2018 08:18   Dg Chest Port 1 View  Result Date: 11/16/2018 CLINICAL DATA:  Chest pain, onset yesterday. Lung cancer with active chemotherapy. Thoracentesis earlier today. EXAM: PORTABLE CHEST 1 VIEW COMPARISON:  Radiographs and CT earlier this day. FINDINGS: Right chest port remains in place. Unchanged volume loss in the right hemithorax. No pneumothorax. Right pleural fluid better demonstrated on prior CT. Unchanged heart size and mediastinal contours with right hilar prominence, stable. Left lung is clear. IMPRESSION: No pneumothorax following right thoracentesis. Unchanged right hemithorax volume loss and hilar density. Electronically Signed   By: Keith Rake M.D.   On: 11/16/2018 18:55   [place] Rt Lower Extrem Ltd Soft Tissue Non Vascular  Result Date: 11/16/2018 CLINICAL DATA:  Open wound of the right inner thigh. Evaluate for deep abscess. EXAM: ULTRASOUND right LOWER EXTREMITY LIMITED TECHNIQUE: Ultrasound examination of the lower extremity soft tissues was performed in the area of clinical concern. COMPARISON:  None. FINDINGS: Scanning in the region of concern does not show any evidence of a deep fluid collection. Mild nonspecific edema of the subcutaneous tissues. IMPRESSION: No visible abscess. Electronically Signed   By: Nelson Chimes M.D.   On: 11/16/2018 19:18   US Thoracentesis Asp Pleural Space W/img Guide  Result Date: 11/16/2018 INDICATION: History of metastatic lung cancer, recurrent right pleural effusion, dyspnea. Request for diagnostic and therapeutic right thoracentesis. EXAM: ULTRASOUND GUIDED RIGHT THORACENTESIS MEDICATIONS: 1% lidocaine 10 mL COMPLICATIONS: None immediate. PROCEDURE: An ultrasound guided thoracentesis was thoroughly discussed with the patient and questions answered. The benefits, risks, alternatives and complications were also discussed. The  patient understands and wishes to proceed with the procedure. Written consent was obtained. Ultrasound was performed to localize and mark an adequate pocket of fluid in the right chest. The area was then prepped and draped in the normal sterile fashion. 1% Lidocaine was used for local anesthesia. Under ultrasound guidance a 6 Fr Safe-T-Centesis catheter was introduced. Thoracentesis was performed. The catheter was removed and a dressing applied. FINDINGS: A total of approximately 450 mL of hazy yellow fluid was removed. Samples were sent to the laboratory as requested by the clinical team. IMPRESSION: Successful ultrasound guided right thoracentesis yielding 450 mL of pleural fluid. No pneumothorax on post-procedure chest x-ray. Read by: Gareth Eagle, PA-C Electronically Signed   By: Jacqulynn Cadet M.D.   On: 11/16/2018 14:11    Subjective: Eager to go home  Discharge Exam: Vitals:   11/17/18 0410 11/17/18  1606  BP: 123/78 124/90  Pulse: (!) 111 (!) 101  Resp: 18 20  Temp: 98 F (36.7 C) 98.9 F (37.2 C)  SpO2: 96% 95%   Vitals:   11/16/18 1813 11/16/18 2107 11/17/18 0410 11/17/18 1606  BP: (!) 138/95 (!) 157/104 123/78 124/90  Pulse: (!) 115 (!) 113 (!) 111 (!) 101  Resp: _0 Temp: 98.3 F (36.8 C) 98 F (36.7 C) 98 F (36.7 C) 98.9 F (37.2 C)  TempSrc: Oral Oral Oral Oral  SpO2: 100% 98% 96% 95%    General: Pt is alert, awake, not in acute distress Cardiovascular: RRR, S1/S2 +, no rubs, no gallops Respiratory: CTA bilaterally, no wheezing, no rhonchi Abdominal: Soft, NT, ND, bowel sounds + Extremities: no edema, no cyanosis   The results of significant diagnostics from this hospitalization (including imaging, microbiology, ancillary and laboratory) are listed below for reference.     Microbiology: Recent Results (from the past 240 hour(s))  SARS Coronavirus 2 [facility] order, Performed in [facility] hospital lab) Nasopharyngeal Nasopharyngeal Swab     Status:  None   Collection Time: 11/16/18  8:44 AM   Specimen: Nasopharyngeal Swab  Result Value Ref Range Status   SARS Coronavirus 2 NEGATIVE NEGATIVE Final    Comment: (NOTE) If result is NEGATIVE SARS-CoV-2 target nucleic acids are NOT DETECTED. The SARS-CoV-2 RNA is generally detectable in upper and lower  respiratory specimens during the acute phase of infection. The lowest  concentration of SARS-CoV-2 viral copies this assay can detect is 250  copies / mL. A negative result does not preclude SARS-CoV-2 infection  and should not be used as the sole basis for treatment or other  patient management decisions.  A negative result may occur with  improper specimen collection / handling, submission of specimen other  than nasopharyngeal swab, presence of viral mutation(s) within the  areas targeted by this assay, and inadequate number of viral copies  (<250 copies / mL). A negative result must be combined with clinical  observations, patient history, and epidemiological information. If result is POSITIVE SARS-CoV-2 target nucleic acids are DETECTED. The SARS-CoV-2 RNA is generally detectable in upper and lower  respiratory specimens dur ing the acute phase of infection.  Positive  results are indicative of active infection with SARS-CoV-2.  Clinical  correlation with patient history and other diagnostic information is  necessary to determine patient infection status.  Positive results do  not rule out bacterial infection or co-infection with other viruses. If result is PRESUMPTIVE POSTIVE SARS-CoV-2 nucleic acids MAY BE PRESENT.   A presumptive positive result was obtained on the submitted specimen  and confirmed on repeat testing.  While 2019 novel coronavirus  (SARS-CoV-2) nucleic acids may be present in the submitted sample  additional confirmatory testing may be necessary for epidemiological  and / or clinical management purposes  to differentiate between  SARS-CoV-2 and other  Sarbecovirus currently known to infect humans.  If clinically indicated additional testing with an alternate test  methodology [phone]) is advised. The SARS-CoV-2 RNA is generally  detectable in upper and lower respiratory sp ecimens during the acute  phase of infection. The expected result is Negative. Fact Sheet for Patients:  StrictlyIdeas.no Fact Sheet for Healthcare Providers: BankingDealers.co.za This test is not yet approved or cleared by the [place] FDA and has been authorized for detection and/or diagnosis of SARS-CoV-2 by FDA under an Emergency Use Authorization (EUA).  This EUA will remain in effect (meaning this test  can be used) for the duration of the COVID-19 declaration under Section 564(b)(1) of the Act, 21 U.S.C. section 360bbb-3(b)(1), unless the authorization is terminated or revoked sooner. Performed at [facility], [place] [address], [place], [place] [address]   Culture, body fluid-bottle     Status: None (Preliminary result)   Collection Time: 11/16/18 12:50 PM   Specimen: Fluid  Result Value Ref Range Status   Specimen Description FLUID  Final   Special Requests NONE  Final   Culture   Final    NO GROWTH 1 DAY Performed at [facility], [place] [address], [place], [place] [address]    Report Status PENDING  Incomplete  Gram stain     Status: None   Collection Time: 11/16/18 12:50 PM   Specimen: Fluid  Result Value Ref Range Status   Specimen Description FLUID  Final   Special Requests NONE  Final   Gram Stain   Final    WBC PRESENT,BOTH PMN AND MONONUCLEAR NO ORGANISMS SEEN CYTOSPIN SMEAR Performed at [facility], [address] [place], [place] [address]    Report Status 11/16/2018 FINAL  Final     Labs: BNP (last 3 results) No results for input(s): BNP in the last 8760 hours. Basic Metabolic Panel: Recent Labs  Lab 11/16/18 0642 11/16/18 1911 11/17/18 0433   NA 138  --  137  K 4.5  --  4.3  CL 104  --  104  CO2 24  --  24  GLUCOSE 178*  --  183*  BUN 8  --  8  CREATININE 0.90 0.90 0.72  CALCIUM 8.9  --  8.9  MG 1.6*  --   --    Liver Function Tests: Recent Labs  Lab 11/17/18 0433  AST 22  ALT 25  ALKPHOS 118  BILITOT 0.3  PROT 6.9  ALBUMIN 2.9*   No results for input(s): LIPASE, AMYLASE in the last 168 hours. No results for input(s): AMMONIA in the last 168 hours. CBC: Recent Labs  Lab 11/16/18 0642 11/16/18 1911 11/17/18 0433  WBC 10.4 10.0 9.6  NEUTROABS 8.6*  --   --   HGB 8.7* 8.8* 7.9*  HCT 27.4* 28.4* 25.1*  MCV 92.3 92.8 93.0  PLT 73* 88* 81*   Cardiac Enzymes: No results for input(s): CKTOTAL, CKMB, CKMBINDEX, TROPONINI in the last 168 hours. BNP: Invalid input(s): POCBNP CBG: Recent Labs  Lab 11/16/18 2101 11/17/18 0008 11/17/18 0406 11/17/18 0811 11/17/18 1157  GLUCAP 185* 215* 163* 127* 109*   D-Dimer No results for input(s): DDIMER in the last 72 hours. Hgb A1c Recent Labs    11/16/18 1906  HGBA1C 10.1*   Lipid Profile No results for input(s): CHOL, HDL, LDLCALC, TRIG, CHOLHDL, LDLDIRECT in the last 72 hours. Thyroid function studies No results for input(s): TSH, T4TOTAL, T3FREE, THYROIDAB in the last 72 hours.  Invalid input(s): FREET3 Anemia work up No results for input(s): VITAMINB12, FOLATE, FERRITIN, TIBC, IRON, RETICCTPCT in the last 72 hours. Urinalysis    Component Value Date/Time   COLORURINE YELLOW 04/29/2018 0419   APPEARANCEUR HAZY (A) 04/29/2018 0419   LABSPEC 1.025 04/29/2018 0419   PHURINE 5.0 04/29/2018 0419   GLUCOSEU 50 (A) 04/29/2018 0419   HGBUR MODERATE (A) 04/29/2018 0419   BILIRUBINUR NEGATIVE 04/29/2018 0419   KETONESUR NEGATIVE 04/29/2018 0419   PROTEINUR <30 10/11/2018 1510   UROBILINOGEN 0.2 02/01/2013 2022   NITRITE POSITIVE (A) 04/29/2018 0419   LEUKOCYTESUR MODERATE (  A) 04/29/2018 0419   Sepsis Labs Invalid input(s): PROCALCITONIN,  WBC,   LACTICIDVEN Microbiology Recent Results (from the past 240 hour(s))  SARS Coronavirus 2 [facility] order, Performed in [facility] hospital lab) Nasopharyngeal Nasopharyngeal Swab     Status: None   Collection Time: 11/16/18  8:44 AM   Specimen: Nasopharyngeal Swab  Result Value Ref Range Status   SARS Coronavirus 2 NEGATIVE NEGATIVE Final    Comment: (NOTE) If result is NEGATIVE SARS-CoV-2 target nucleic acids are NOT DETECTED. The SARS-CoV-2 RNA is generally detectable in upper and lower  respiratory specimens during the acute phase of infection. The lowest  concentration of SARS-CoV-2 viral copies this assay can detect is 250  copies / mL. A negative result does not preclude SARS-CoV-2 infection  and should not be used as the sole basis for treatment or other  patient management decisions.  A negative result may occur with  improper specimen collection / handling, submission of specimen other  than nasopharyngeal swab, presence of viral mutation(s) within the  areas targeted by this assay, and inadequate number of viral copies  (<250 copies / mL). A negative result must be combined with clinical  observations, patient history, and epidemiological information. If result is POSITIVE SARS-CoV-2 target nucleic acids are DETECTED. The SARS-CoV-2 RNA is generally detectable in upper and lower  respiratory specimens dur ing the acute phase of infection.  Positive  results are indicative of active infection with SARS-CoV-2.  Clinical  correlation with patient history and other diagnostic information is  necessary to determine patient infection status.  Positive results do  not rule out bacterial infection or co-infection with other viruses. If result is PRESUMPTIVE POSTIVE SARS-CoV-2 nucleic acids MAY BE PRESENT.   A presumptive positive result was obtained on the submitted specimen  and confirmed on repeat testing.  While 2019 novel coronavirus  (SARS-CoV-2) nucleic acids may be present  in the submitted sample  additional confirmatory testing may be necessary for epidemiological  and / or clinical management purposes  to differentiate between  SARS-CoV-2 and other Sarbecovirus currently known to infect humans.  If clinically indicated additional testing with an alternate test  methodology [phone]) is advised. The SARS-CoV-2 RNA is generally  detectable in upper and lower respiratory sp ecimens during the acute  phase of infection. The expected result is Negative. Fact Sheet for Patients:  StrictlyIdeas.no Fact Sheet for Healthcare Providers: BankingDealers.co.za This test is not yet approved or cleared by the [place] FDA and has been authorized for detection and/or diagnosis of SARS-CoV-2 by FDA under an Emergency Use Authorization (EUA).  This EUA will remain in effect (meaning this test can be used) for the duration of the COVID-19 declaration under Section 564(b)(1) of the Act, 21 U.S.C. section 360bbb-3(b)(1), unless the authorization is terminated or revoked sooner. Performed at [facility], [place] [address] [place], [place] [address]   Culture, body fluid-bottle     Status: None (Preliminary result)   Collection Time: 11/16/18 12:50 PM   Specimen: Fluid  Result Value Ref Range Status   Specimen Description FLUID  Final   Special Requests NONE  Final   Culture   Final    NO GROWTH 1 DAY Performed at [facility], [place] [address], [place], [place] [address]    Report Status PENDING  Incomplete  Gram stain     Status: None   Collection Time: 11/16/18 12:50 PM   Specimen: Fluid  Result Value Ref Range Status  Specimen Description FLUID  Final   Special Requests NONE  Final   Gram Stain   Final    WBC PRESENT,BOTH PMN AND MONONUCLEAR NO ORGANISMS SEEN CYTOSPIN SMEAR Performed at [facility], [address], [place], [place] [address]    Report Status 11/16/2018 FINAL   Final   Time spent: 30 min SIGNED:   Marylu Lund, MD  [facility] 11/17/2018, 7:25 PM  If 7PM-7AM, please contact night-coverage

## 2018-11-17 NOTE — Care Management Obs Status (Signed)
[facility] NOTIFICATION   Patient Details  Name: Marilyn [name] MRN: [id number] Date of Birth: 03-27-1966   Medicare Observation Status Notification Given:  Yes    Purcell Mouton, RN 11/17/2018, 12:47 PM

## 2018-11-17 NOTE — Progress Notes (Addendum)
Inpatient Diabetes Program Recommendations  AACE/ADA: New Consensus Statement on Inpatient Glycemic Control (2015)  Target Ranges:  Prepandial:   less than 140 mg/dL      Peak postprandial:   less than 180 mg/dL (1-2 hours)      Critically ill patients:  140 - 180 mg/dL   Results for TALICIA, SUI (MRN [id number]) as of 11/17/2018 07:57  Ref. Range 11/16/2018 21:01 11/17/2018 00:08 11/17/2018 04:06  Glucose-Capillary Latest Ref Range: 70 - 99 mg/dL 185 (H) 215 (H)  70 units LANTUS 163 (H)   Results for YUVAL, NOLET (MRN [id number]) as of 11/17/2018 07:57  Ref. Range 11/16/2018 19:06  Hemoglobin A1C Latest Ref Range: 4.8 - 5.6 % 10.1 (H)  (243 mg/dl)    Admit with: CP/ Pleural Effusion/ R Thigh--Gluteal Abscess  History: DM, Metastatic Lung Cancer getting Chemo  Home DM Meds: Tresiba 100 units Daily       Novolog 0-10 units TID per SSI  Current Orders: Lantus 70 units QHS      Novolog Sensitive Correction Scale/ SSI (0-9 units) TID AC + HS    PCP: Selina Cooley, NP with [facility] ([place])       Note A1c elevated to 10.1%.  This elevated A1c level could be due to the fact that patient is taking Decadron at home for Chemotherapy treatments.  Recommend follow up with PCP after discharge for further diaebets management.  Lantus started at [place].  Novolog SSi to start this AM.   Addendum 2pm- Met with pt earlier this afternoon to discuss current A1c of 10.1%.  Pt surprised to hear her A1c was so high.  We discussed the fact that her Thigh Abscess and the Steroids she gets for Chemo may be contributing to her elevated CBGs and A1c at home.  Pt also told me she recently got her CBG meter supplies refilled (was out of supplies for many weeks) and she was guessing at how much Novolog to take at home.  Has her insulin and CBG meter supplies.  Told me she plans to check her CBGs TID before meals now that she has meter supplies.  Discussed  with pt that we will be checking her CBGs often in the hospital and will be giving her insulin similar to her regimen at home.  Did not have any questions for me regarding her DM and thanked me for the visit.       --Will follow patient during hospitalization--  Wyn Quaker RN, MSN, CDE Diabetes Coordinator Inpatient Glycemic Control Team Team Pager: [phone] (8a-5p)

## 2018-11-17 NOTE — Consult Note (Signed)
[name] wound consult note Patient receiving care in [place]. Patient able to turn and reposition self. Reason for Consult: Right upper inner thigh wound and coccyx wound Wound type: The right inner upper thigh wound started as a "bump about 2 weeks ago" .  The patient sought help for the area from her PCP.  She received antibiotics.  About a week after stareing the antibiotics the area spontaneously burst and drained out "pus".  Today there is a shallow 100% pink wound that has an opening the diameter of a sterile cotton swab.  I can only insert the swab approximately 3 mm into the opening.  The area is painful to palpation the closer you get to the wound, and the tissue is hardened and darkened.  I was unable to express any drainage from the area.  For this wound the treatment will be 2 % Mupirocin ointment twice daily. Apply into wound bed on right inner upper thigh.  Cover with a small foam dressing. Change the foam daily and prn.  The coccyx area in the very portion of the medial gluteal fold has a small 1 cm x 1 cm stage 2 wound that is 100% pink, with normal color periwound tissue.  When asked if she urinates on herself, the patient responded that she does "sometimes".  When asked about walking and moving, she relates that she doesn't have any energy and typically stays in the bed or on her sofa.  I have explained the importance of turning and repositioning herself and some of the potential consequences if she does not.  The treatment for this area will be 40% Desitin ointment 4 times daily. Pressure Injury POA: Yes Monitor the wound area(s) for worsening of condition such as: Signs/symptoms of infection,  Increase in size,  Development of or worsening of odor, Development of pain, or increased pain at the affected locations.  Notify the medical team if any of these develop.  Thank you for the consult.  Discussed plan of care with the patient.  [facility] nurse will not follow at this time.  Please  re-consult the [facility] team if needed.  Val Riles, RN, MSN, CWOCN, CNS-BC, pager [phone]

## 2018-11-17 NOTE — Progress Notes (Signed)
Echocardiogram 2D Echocardiogram has been performed.  Marilyn [name] 11/17/2018, 1:49 PM

## 2018-11-17 NOTE — Progress Notes (Signed)
AVS reviewed with patient.  Verbalized understanding of discharge instructions, physician follow-up, medications, wound care.  IV deaccessed by IV team prior to discharge.  Patient transported by NT via wheelchair to main entrance at discharge.  Patient stable at time of discharge.

## 2018-11-18 LAB — HIV ANTIBODY (ROUTINE TESTING W REFLEX): HIV Screen 4th Generation wRfx: NONREACTIVE

## 2018-11-18 IMAGING — MR MR CERVICAL SPINE WO/W CM
4 of 8 series · 17 of 48 positions shown · IV contrast (gadavist)
Comparison: PET-CT 08/03/2017.  Brain MRI 11/19/2016, and earlier.

CLINICAL DATA: 52-year-old female with metastatic non-small cell
lung cancer. Occipital and neck pain.

EXAM:
MRI HEAD WITHOUT AND WITH CONTRAST
MRI CERVICAL SPINE WITHOUT AND WITH CONTRAST
TECHNIQUE: Multiplanar, multiecho pulse sequences of the brain and surrounding
structures, and cervical spine, to include the craniocervical
junction and cervicothoracic junction, were obtained without and
with intravenous contrast.
CONTRAST:  10 milliliters Gadavist

[Series 2: T2 · sagittal · 3.0mm · 0.41mm/px · 4 of 14 slices shown (1 of 2)]
[im 1/14]
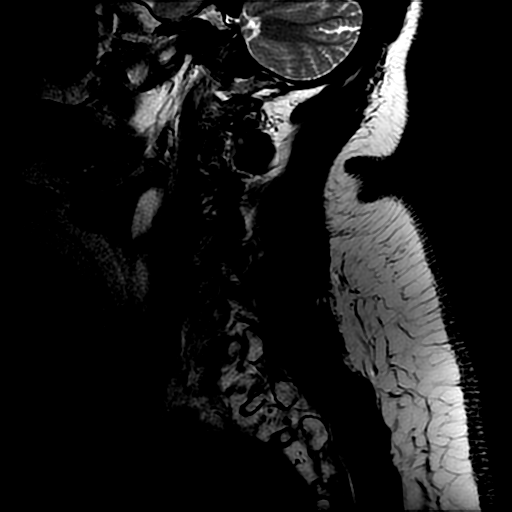
[im 5/14]
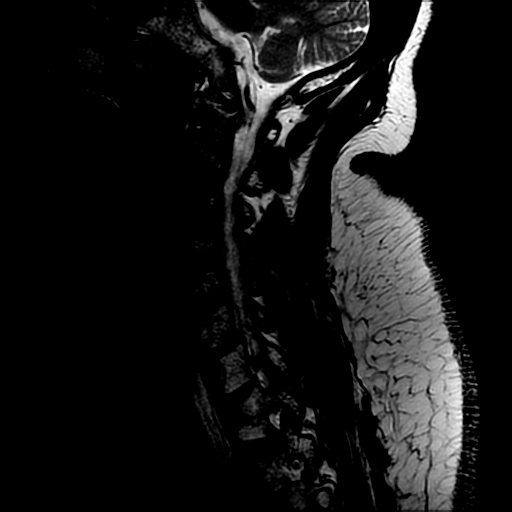
[im 9/14]
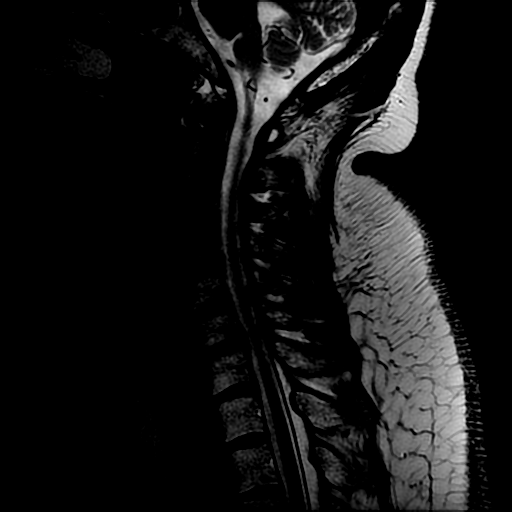
[im 14/14]
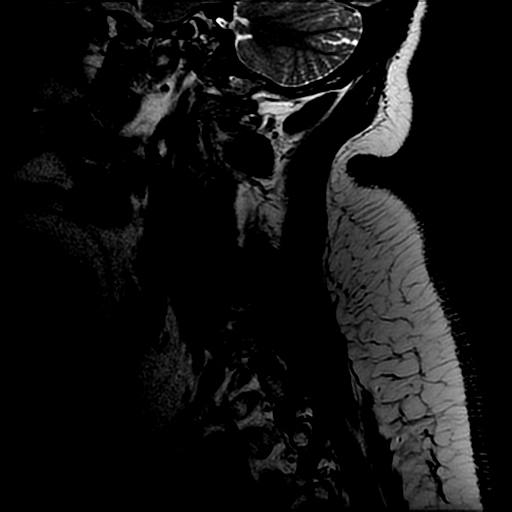

[Series 3: T1 · sagittal · 3.0mm · 0.41mm/px · 3 of 14 slices shown (1 of 2)]
[im 1/14]
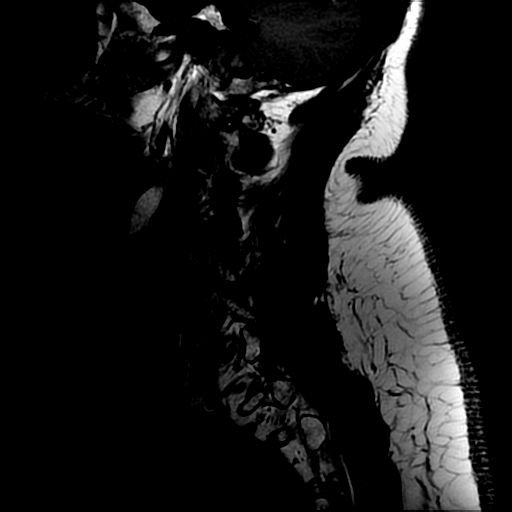
[im 9/14]
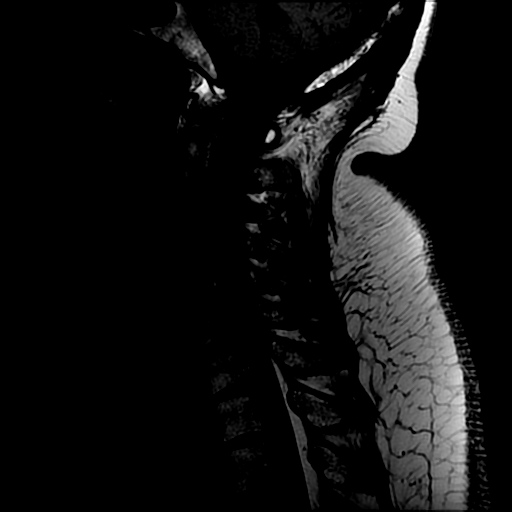
[im 14/14]
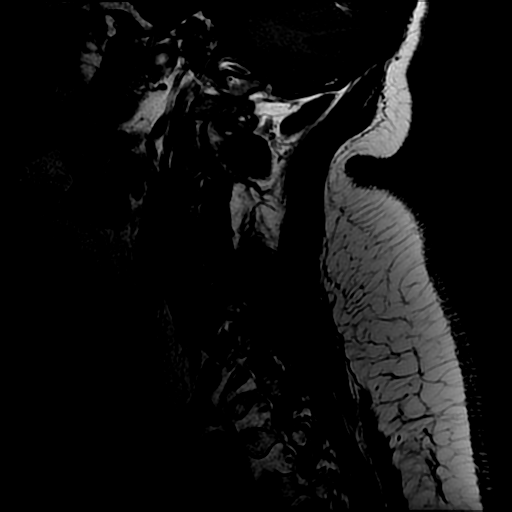

[Series 6: T2 · axial · 3.1mm · 0.35mm/px · z∈[+67,+147]mm · 7 of 28 slices shown (2 of 2)]
[im 1/28]
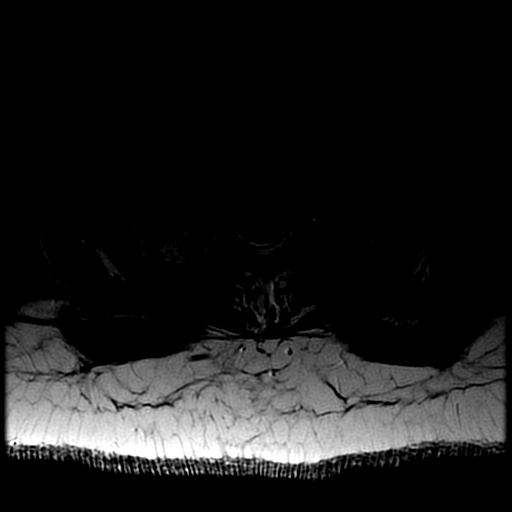
[im 4/28]
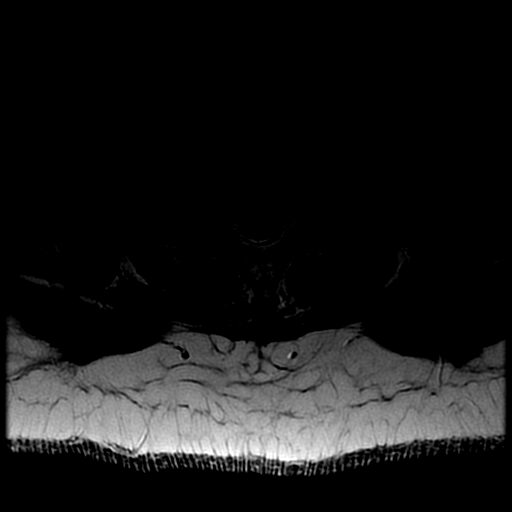
[im 8/28]
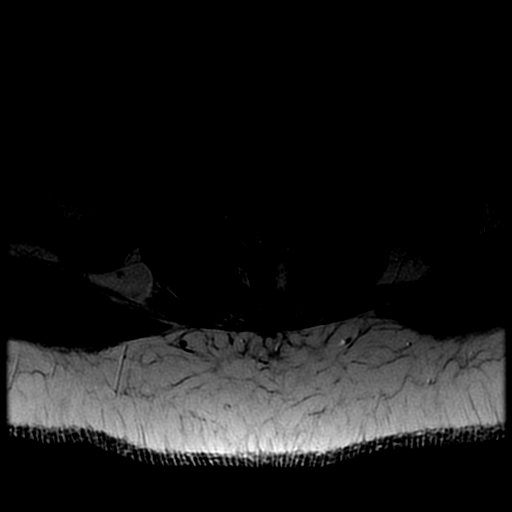
[im 12/28]
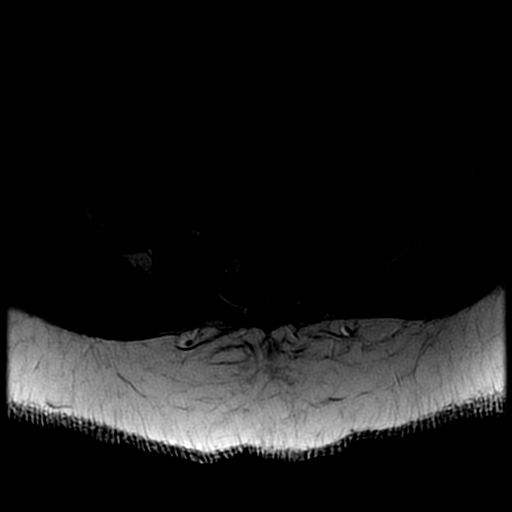
[im 16/28]
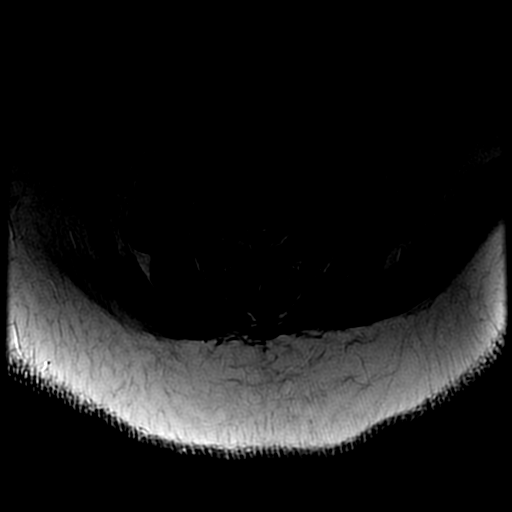
[im 20/28]
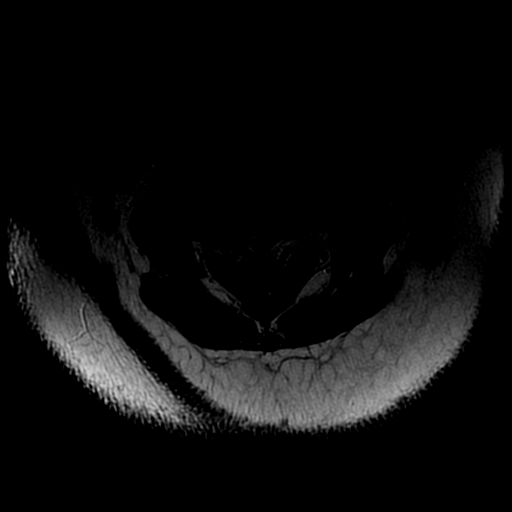
[im 24/28]
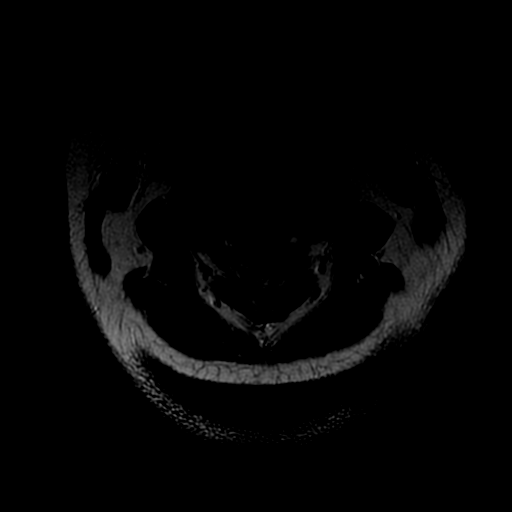

[Series 7: T1 · axial · non-contrast · 3.1mm · 0.35mm/px · z∈[+77,+147]mm · 3 of 28 slices shown (2 of 2)]
[im 4/28]
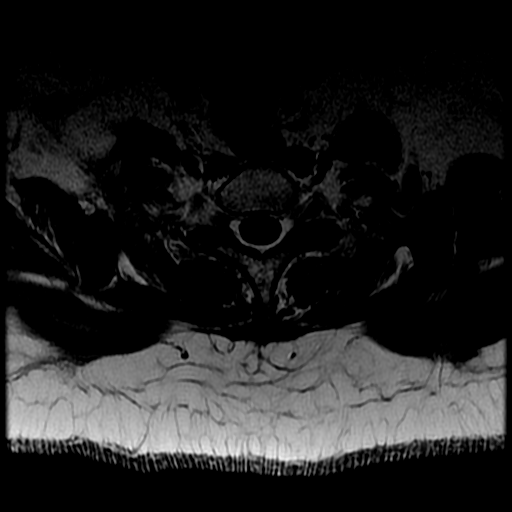
[im 16/28]
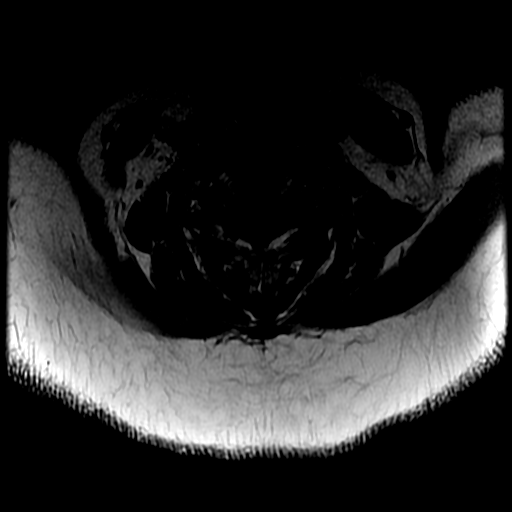
[im 24/28]
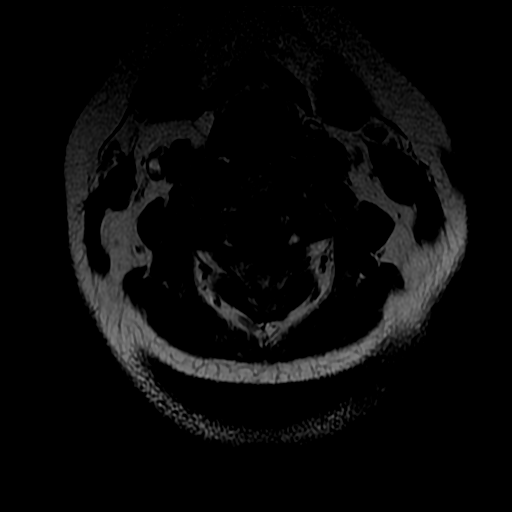

[17 of 48 positions shown; findings below may reference images not displayed]

FINDINGS: MRI HEAD FINDINGS

Brain: No abnormal enhancement identified. No midline shift, mass
effect, or evidence of intracranial mass lesion. No dural
thickening.

Normal cerebral volume. No restricted diffusion to suggest acute
infarction. No ventriculomegaly, extra-axial collection or acute
intracranial hemorrhage. Cervicomedullary junction and pituitary are
within normal limits.

Scattered small cerebral white matter T2 and FLAIR hyperintense foci
in both hemispheres are redemonstrated and mostly subcortical. These
are stable since 4657 and nonspecific. No cortical encephalomalacia
or chronic cerebral blood products identified. The deep gray matter
nuclei, brainstem, and cerebellum remain normal.

Vascular: Major intracranial vascular flow voids are stable since
4657. The vertebrobasilar system is diminutive probably due to fetal
type PCA origins. The major dural venous sinuses are enhancing and
appear patent.

Skull and upper cervical spine: Cervical spine findings are below.
Calvarium bone marrow signal, including in the occiput and at the
skull base is stable since 1905 and within normal limits.

Sinuses/Orbits: Normal orbits soft tissues. Trace paranasal sinus
mucosal thickening today.

Other: Mastoid air cells remain clear. Visible internal auditory
structures appear normal. Normal stylomastoid foramina. Scalp and
face soft tissues appear within normal limits.

MRI CERVICAL SPINE FINDINGS

Alignment: Normal vertebral height and alignment.

Vertebrae: Visualized bone marrow signal is within normal limits;
increased intrinsic T1 marrow signal in the visible upper thoracic
levels probably reflects prior radiation therapy. Visible skull base
also appears within normal limits. No marrow edema or evidence of
acute osseous abnormality. No abnormal enhancement identified.

Cord: Spinal cord signal is within normal limits at all visualized
levels. No abnormal intradural enhancement. No dural thickening.

Posterior Fossa, vertebral arteries, paraspinal tissues: Preserved
major vascular flow voids in the neck. The left vertebral artery is
dominant. Negative neck soft tissues. No neck mass or
lymphadenopathy.

Disc levels:

Generally mild for age cervical spine degeneration.

At C4-C5 there is disc bulging, facet and ligament flavum
hypertrophy with borderline to mild spinal stenosis.

At C5-C6 there is a small to moderate size central disc protrusion
(series 6, image 17) superimposed on mild disc bulge and ligament
flavum hypertrophy resulting in spinal stenosis with up to mild
spinal cord mass effect. No cord signal abnormality. No foraminal
stenosis.

At C6-C7 there is left eccentric disc bulging with mild endplate
spurring, facet and ligament flavum hypertrophy. Mild spinal
stenosis and left C7 foraminal stenosis. No cord mass effect.
IMPRESSION: 1. No metastatic disease identified in the head or cervical spine.
Stable MRI appearance of the brain.
2. Cervical spine degeneration, most pronounced at C5-C6 where a
central disc herniation results in mild spinal stenosis with mild
cord mass effect. No spinal cord signal abnormality or foraminal
stenosis.

## 2018-11-18 IMAGING — MR MR HEAD WO/W CM
11 of 21 series · 30 of 48 positions shown · IV contrast (gadavist)
Comparison: PET-CT 08/03/2017.  Brain MRI 11/19/2016, and earlier.

CLINICAL DATA: 52-year-old female with metastatic non-small cell
lung cancer. Occipital and neck pain.

EXAM:
MRI HEAD WITHOUT AND WITH CONTRAST
MRI CERVICAL SPINE WITHOUT AND WITH CONTRAST
TECHNIQUE: Multiplanar, multiecho pulse sequences of the brain and surrounding
structures, and cervical spine, to include the craniocervical
junction and cervicothoracic junction, were obtained without and
with intravenous contrast.
CONTRAST:  10 milliliters Gadavist

[Series 2: T2 · sagittal · 3.0mm · 0.41mm/px · 1 of 14 slices shown (1 of 3)]
[im 1/14]
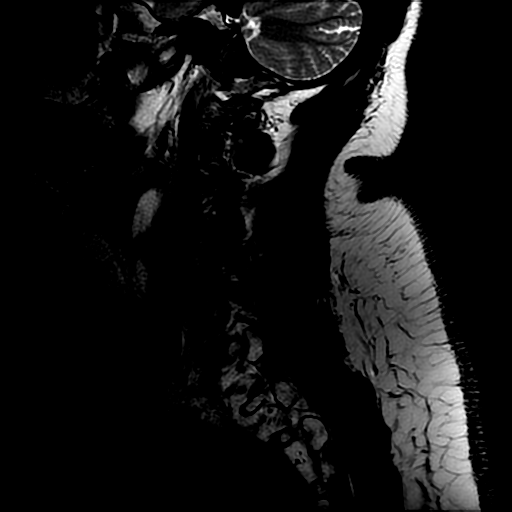

[Series 6: T2 · axial · 3.1mm · 0.35mm/px · z∈[+67,+161]mm · 2 of 28 slices shown (2 of 3)]
[im 1/28]
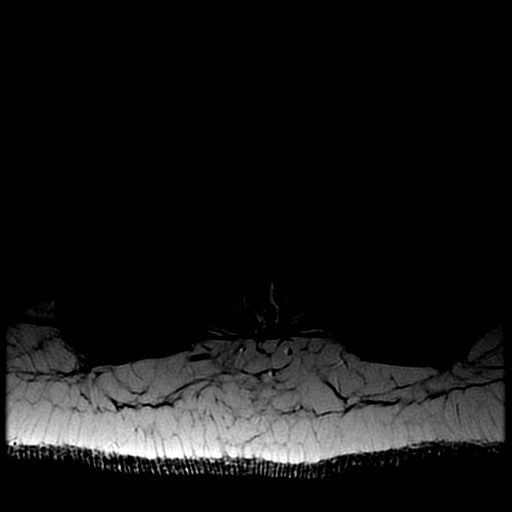
[im 28/28]
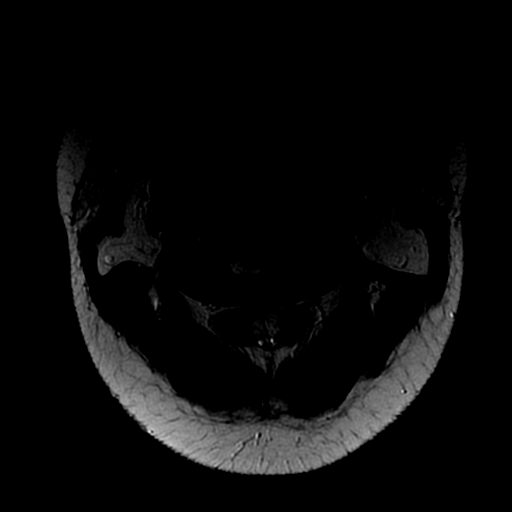

[Series 11: DWI · axial · 3.0mm · 1.09mm/px · z∈[+219,+362]mm · 6 of 98 slices shown (1 of 4)]
[im 1/98]
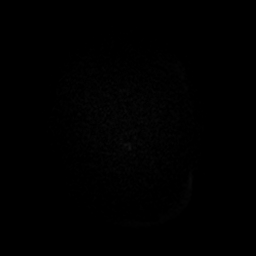
[im 20/98]
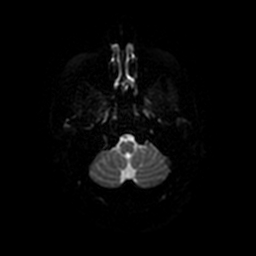
[im 39/98]
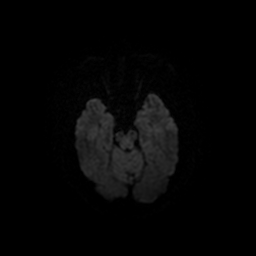
[im 59/98]
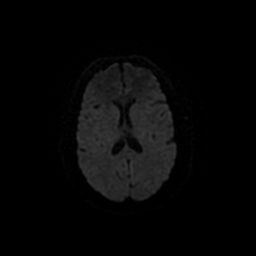
[im 78/98]
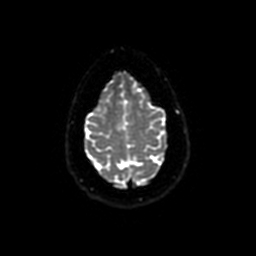
[im 98/98]
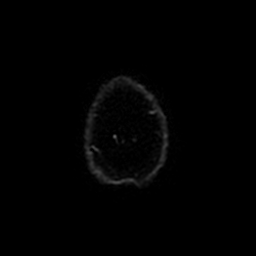

[Series 12: DWI · coronal · 3.0mm · 1.09mm/px · 7 of 104 slices shown (2 of 4)]
[im 1/104]
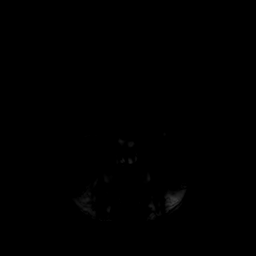
[im 18/104]
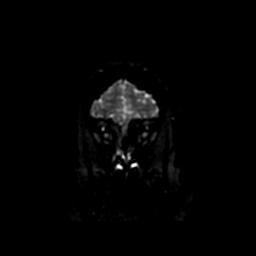
[im 35/104]
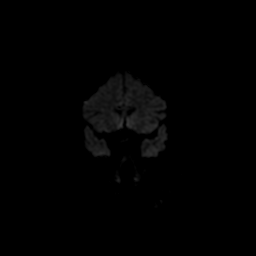
[im 52/104]
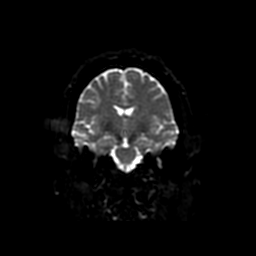
[im 69/104]
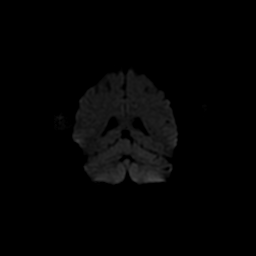
[im 86/104]
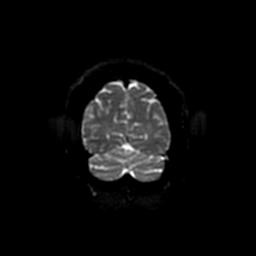
[im 104/104]
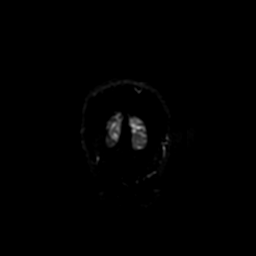

[Series 13: T2 · axial · 5.0mm · 0.43mm/px · 1 of 23 slices shown (3 of 3)]
[im 1/23]
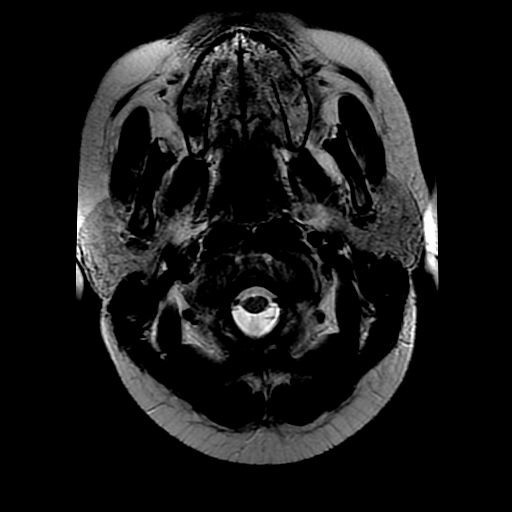

[Series 14: FLAIR · axial · 5.0mm · 0.43mm/px · 1 of 23 slices shown]
[im 1/23]
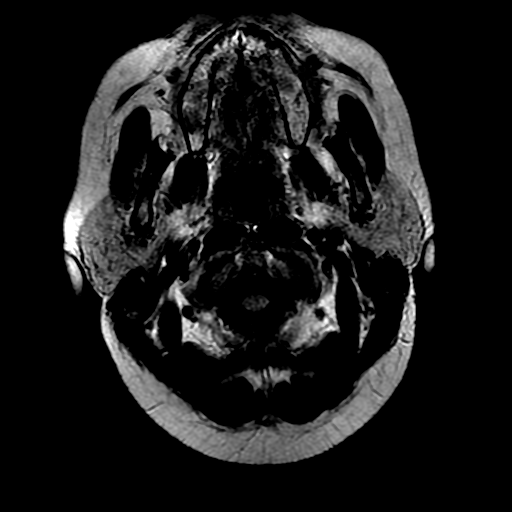

[Series 18: T2 post-contrast · coronal · 5.0mm · 0.45mm/px · 2 of 24 slices shown]
[im 1/24]
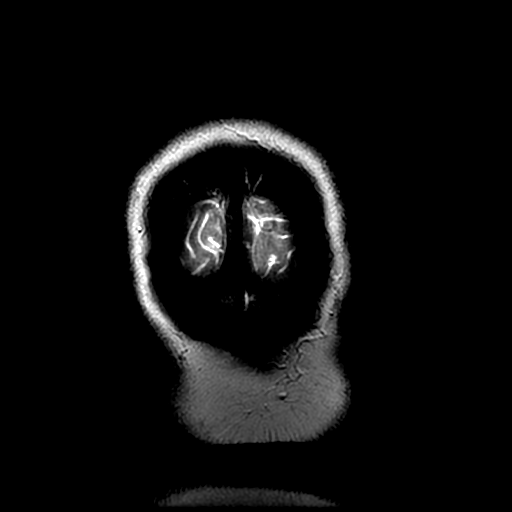
[im 24/24]
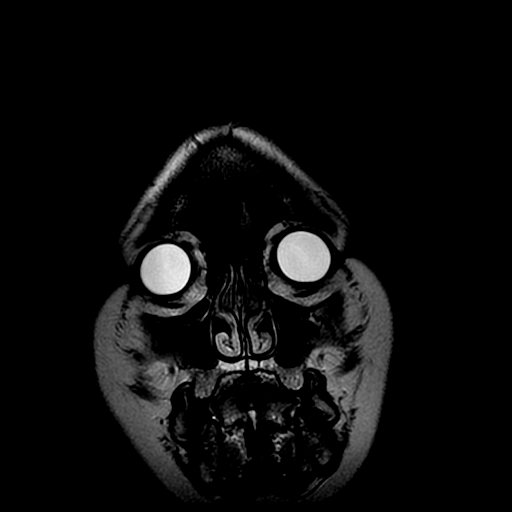

[Series 20: T1 post-contrast · coronal · 5.0mm · 0.45mm/px · 2 of 24 slices shown (1 of 2)]
[im 1/24]
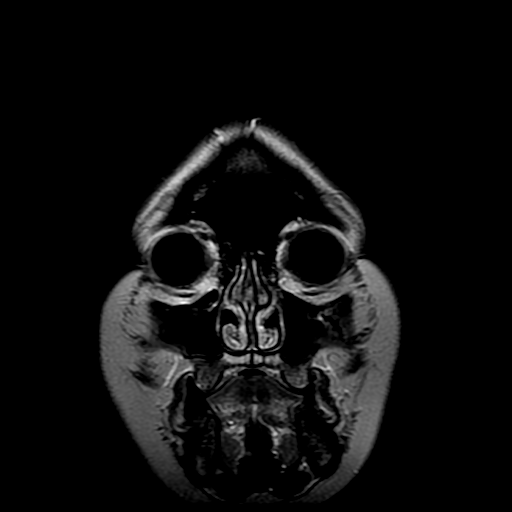
[im 24/24]
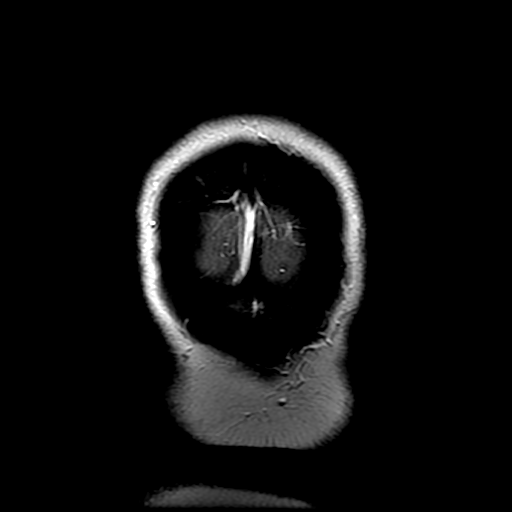

[Series 23: T1 post-contrast · axial · 3.0mm · 0.35mm/px · z∈[-154,-61]mm · 2 of 28 slices shown (2 of 2)]
[im 1/28]
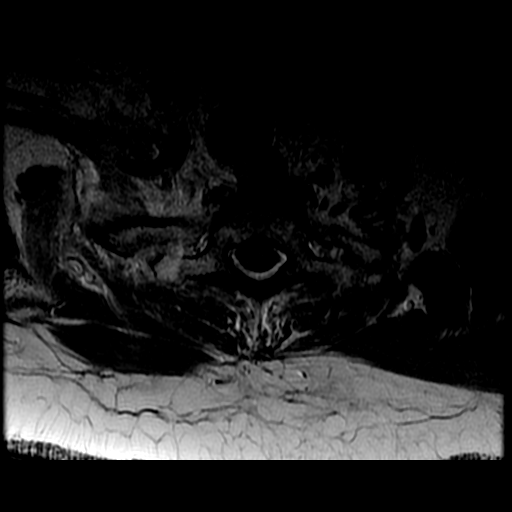
[im 28/28]
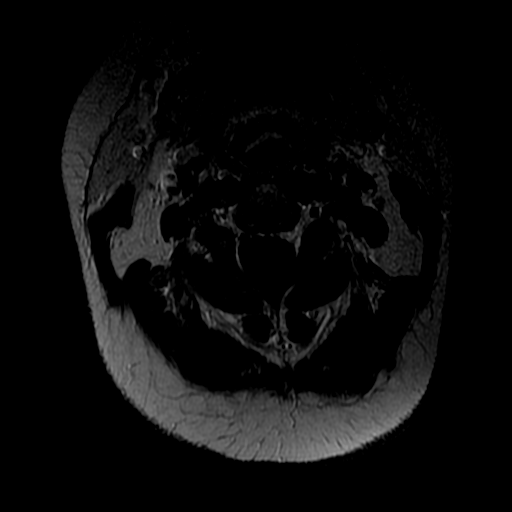

[Series 1100: DWI · axial · 3.0mm · 1.09mm/px · z∈[+219,+362]mm · 3 of 49 slices shown (3 of 4)]
[im 1/49]
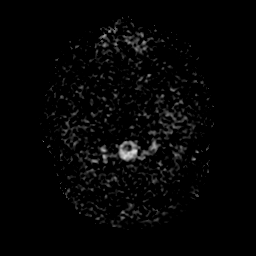
[im 25/49]
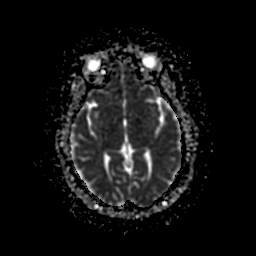
[im 49/49]
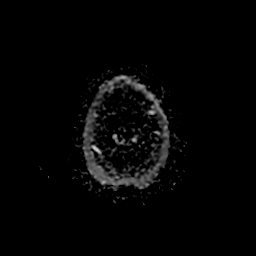

[Series 1200: DWI · coronal · 3.0mm · 1.09mm/px · 3 of 52 slices shown (4 of 4)]
[im 1/52]
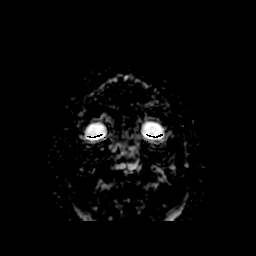
[im 26/52]
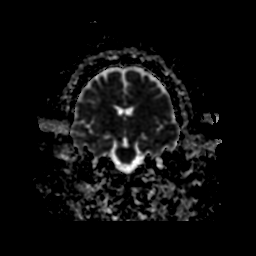
[im 52/52]
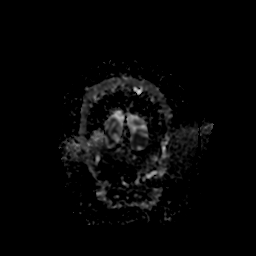

[30 of 48 positions shown; findings below may reference images not displayed]

FINDINGS: MRI HEAD FINDINGS

Brain: No abnormal enhancement identified. No midline shift, mass
effect, or evidence of intracranial mass lesion. No dural
thickening.

Normal cerebral volume. No restricted diffusion to suggest acute
infarction. No ventriculomegaly, extra-axial collection or acute
intracranial hemorrhage. Cervicomedullary junction and pituitary are
within normal limits.

Scattered small cerebral white matter T2 and FLAIR hyperintense foci
in both hemispheres are redemonstrated and mostly subcortical. These
are stable since 4657 and nonspecific. No cortical encephalomalacia
or chronic cerebral blood products identified. The deep gray matter
nuclei, brainstem, and cerebellum remain normal.

Vascular: Major intracranial vascular flow voids are stable since
4657. The vertebrobasilar system is diminutive probably due to fetal
type PCA origins. The major dural venous sinuses are enhancing and
appear patent.

Skull and upper cervical spine: Cervical spine findings are below.
Calvarium bone marrow signal, including in the occiput and at the
skull base is stable since 1905 and within normal limits.

Sinuses/Orbits: Normal orbits soft tissues. Trace paranasal sinus
mucosal thickening today.

Other: Mastoid air cells remain clear. Visible internal auditory
structures appear normal. Normal stylomastoid foramina. Scalp and
face soft tissues appear within normal limits.

MRI CERVICAL SPINE FINDINGS

Alignment: Normal vertebral height and alignment.

Vertebrae: Visualized bone marrow signal is within normal limits;
increased intrinsic T1 marrow signal in the visible upper thoracic
levels probably reflects prior radiation therapy. Visible skull base
also appears within normal limits. No marrow edema or evidence of
acute osseous abnormality. No abnormal enhancement identified.

Cord: Spinal cord signal is within normal limits at all visualized
levels. No abnormal intradural enhancement. No dural thickening.

Posterior Fossa, vertebral arteries, paraspinal tissues: Preserved
major vascular flow voids in the neck. The left vertebral artery is
dominant. Negative neck soft tissues. No neck mass or
lymphadenopathy.

Disc levels:

Generally mild for age cervical spine degeneration.

At C4-C5 there is disc bulging, facet and ligament flavum
hypertrophy with borderline to mild spinal stenosis.

At C5-C6 there is a small to moderate size central disc protrusion
(series 6, image 17) superimposed on mild disc bulge and ligament
flavum hypertrophy resulting in spinal stenosis with up to mild
spinal cord mass effect. No cord signal abnormality. No foraminal
stenosis.

At C6-C7 there is left eccentric disc bulging with mild endplate
spurring, facet and ligament flavum hypertrophy. Mild spinal
stenosis and left C7 foraminal stenosis. No cord mass effect.
IMPRESSION: 1. No metastatic disease identified in the head or cervical spine.
Stable MRI appearance of the brain.
2. Cervical spine degeneration, most pronounced at C5-C6 where a
central disc herniation results in mild spinal stenosis with mild
cord mass effect. No spinal cord signal abnormality or foraminal
stenosis.

## 2018-11-21 LAB — CULTURE, BODY FLUID W GRAM STAIN -BOTTLE: Culture: NO GROWTH

## 2018-11-22 ENCOUNTER — Other Ambulatory Visit: Payer: Self-pay | Admitting: *Deleted

## 2018-11-22 ENCOUNTER — Inpatient Hospital Stay (HOSPITAL_BASED_OUTPATIENT_CLINIC_OR_DEPARTMENT_OTHER): Payer: Medicare Other | Admitting: Hematology

## 2018-11-22 ENCOUNTER — Inpatient Hospital Stay: Payer: Medicare Other

## 2018-11-22 ENCOUNTER — Inpatient Hospital Stay: Payer: Medicare Other | Attending: Hematology

## 2018-11-22 ENCOUNTER — Ambulatory Visit (HOSPITAL_BASED_OUTPATIENT_CLINIC_OR_DEPARTMENT_OTHER): Payer: Medicare Other | Admitting: Medical

## 2018-11-22 ENCOUNTER — Other Ambulatory Visit: Payer: Self-pay

## 2018-11-22 VITALS — BP 160/94 | HR 127 | Temp 98.9°F | Resp 18 | Ht 69.0 in | Wt 259.6 lb

## 2018-11-22 DIAGNOSIS — E1165 Type 2 diabetes mellitus with hyperglycemia: Secondary | ICD-10-CM | POA: Diagnosis not present

## 2018-11-22 DIAGNOSIS — R911 Solitary pulmonary nodule: Secondary | ICD-10-CM | POA: Insufficient documentation

## 2018-11-22 DIAGNOSIS — R000 Tachycardia, unspecified: Secondary | ICD-10-CM | POA: Insufficient documentation

## 2018-11-22 DIAGNOSIS — D649 Anemia, unspecified: Secondary | ICD-10-CM | POA: Diagnosis not present

## 2018-11-22 DIAGNOSIS — G4700 Insomnia, unspecified: Secondary | ICD-10-CM | POA: Diagnosis not present

## 2018-11-22 DIAGNOSIS — C792 Secondary malignant neoplasm of skin: Secondary | ICD-10-CM | POA: Insufficient documentation

## 2018-11-22 DIAGNOSIS — R5383 Other fatigue: Secondary | ICD-10-CM | POA: Diagnosis not present

## 2018-11-22 DIAGNOSIS — Z79899 Other long term (current) drug therapy: Secondary | ICD-10-CM | POA: Insufficient documentation

## 2018-11-22 DIAGNOSIS — E785 Hyperlipidemia, unspecified: Secondary | ICD-10-CM | POA: Diagnosis not present

## 2018-11-22 DIAGNOSIS — C3491 Malignant neoplasm of unspecified part of right bronchus or lung: Secondary | ICD-10-CM

## 2018-11-22 DIAGNOSIS — I10 Essential (primary) hypertension: Secondary | ICD-10-CM | POA: Diagnosis not present

## 2018-11-22 DIAGNOSIS — G893 Neoplasm related pain (acute) (chronic): Secondary | ICD-10-CM | POA: Insufficient documentation

## 2018-11-22 DIAGNOSIS — T451X5A Adverse effect of antineoplastic and immunosuppressive drugs, initial encounter: Secondary | ICD-10-CM | POA: Diagnosis not present

## 2018-11-22 DIAGNOSIS — J45909 Unspecified asthma, uncomplicated: Secondary | ICD-10-CM | POA: Insufficient documentation

## 2018-11-22 DIAGNOSIS — Z95828 Presence of other vascular implants and grafts: Secondary | ICD-10-CM

## 2018-11-22 DIAGNOSIS — Z7189 Other specified counseling: Secondary | ICD-10-CM

## 2018-11-22 DIAGNOSIS — D509 Iron deficiency anemia, unspecified: Secondary | ICD-10-CM

## 2018-11-22 DIAGNOSIS — R599 Enlarged lymph nodes, unspecified: Secondary | ICD-10-CM | POA: Diagnosis not present

## 2018-11-22 DIAGNOSIS — J910 Malignant pleural effusion: Secondary | ICD-10-CM | POA: Diagnosis not present

## 2018-11-22 DIAGNOSIS — D6481 Anemia due to antineoplastic chemotherapy: Secondary | ICD-10-CM | POA: Insufficient documentation

## 2018-11-22 DIAGNOSIS — Z452 Encounter for adjustment and management of vascular access device: Secondary | ICD-10-CM

## 2018-11-22 DIAGNOSIS — C3490 Malignant neoplasm of unspecified part of unspecified bronchus or lung: Secondary | ICD-10-CM

## 2018-11-22 LAB — CBC WITH DIFFERENTIAL/PLATELET
Abs Immature Granulocytes: 0.08 10*3/uL — ABNORMAL HIGH (ref 0.00–0.07)
Basophils Absolute: 0 10*3/uL (ref 0.0–0.1)
Basophils Relative: 0 %
Eosinophils Absolute: 0 10*3/uL (ref 0.0–0.5)
Eosinophils Relative: 0 %
HCT: 25.1 % — ABNORMAL LOW (ref 36.0–46.0)
Hemoglobin: 8.2 g/dL — ABNORMAL LOW (ref 12.0–15.0)
Immature Granulocytes: 1 %
Lymphocytes Relative: 11 %
Lymphs Abs: 1.3 10*3/uL (ref 0.7–4.0)
MCH: 30.3 pg (ref 26.0–34.0)
MCHC: 32.7 g/dL (ref 30.0–36.0)
MCV: 92.6 fL (ref 80.0–100.0)
Monocytes Absolute: 1 10*3/uL (ref 0.1–1.0)
Monocytes Relative: 9 %
Neutro Abs: 9.1 10*3/uL — ABNORMAL HIGH (ref 1.7–7.7)
Neutrophils Relative %: 79 %
Platelets: 172 10*3/uL (ref 150–400)
RBC: 2.71 MIL/uL — ABNORMAL LOW (ref 3.87–5.11)
RDW: 16.2 % — ABNORMAL HIGH (ref 11.5–15.5)
WBC: 11.5 10*3/uL — ABNORMAL HIGH (ref 4.0–10.5)
nRBC: 0 % (ref 0.0–0.2)

## 2018-11-22 LAB — CMP (CANCER CENTER ONLY)
ALT: 16 U/L (ref 0–44)
AST: 16 U/L (ref 15–41)
Albumin: 3 g/dL — ABNORMAL LOW (ref 3.5–5.0)
Alkaline Phosphatase: 118 U/L (ref 38–126)
Anion gap: 11 (ref 5–15)
BUN: 9 mg/dL (ref 6–20)
CO2: 23 mmol/L (ref 22–32)
Calcium: 9.2 mg/dL (ref 8.9–10.3)
Chloride: 105 mmol/L (ref 98–111)
Creatinine: 0.92 mg/dL (ref 0.44–1.00)
GFR, Est AFR Am: >60 mL/min (ref >60)
GFR, Estimated: >60 mL/min (ref >60)
Glucose, Bld: 218 mg/dL — ABNORMAL HIGH (ref 70–99)
Potassium: 4.2 mmol/L (ref 3.5–5.1)
Sodium: 139 mmol/L (ref 135–145)
Total Bilirubin: 0.2 mg/dL — ABNORMAL LOW (ref 0.3–1.2)
Total Protein: 7.3 g/dL (ref 6.5–8.1)

## 2018-11-22 LAB — PREPARE RBC (CROSSMATCH)

## 2018-11-22 MED ORDER — DIPHENHYDRAMINE HCL 25 MG PO CAPS
ORAL_CAPSULE | ORAL | Status: AC
  Filled 2018-11-22: qty 1

## 2018-11-22 MED ORDER — ACETAMINOPHEN 325 MG PO TABS
650.0000 mg | ORAL_TABLET | Freq: Once | ORAL | Status: AC
  Administered 2018-11-22: 650 mg via ORAL

## 2018-11-22 MED ORDER — SODIUM CHLORIDE 0.9% FLUSH
10.0000 mL | INTRAVENOUS | Status: DC | PRN
  Administered 2018-11-22: 12:00:00 10 mL via INTRAVENOUS
  Filled 2018-11-22: qty 10

## 2018-11-22 MED ORDER — ACETAMINOPHEN 325 MG PO TABS
ORAL_TABLET | ORAL | Status: AC
  Filled 2018-11-22: qty 2

## 2018-11-22 MED ORDER — SODIUM CHLORIDE 0.9% IV SOLUTION
250.0000 mL | Freq: Once | INTRAVENOUS | Status: AC
  Administered 2018-11-22: 14:00:00 250 mL via INTRAVENOUS
  Filled 2018-11-22: qty 250

## 2018-11-22 MED ORDER — HEPARIN SOD (PORK) LOCK FLUSH 100 UNIT/ML IV SOLN
500.0000 [IU] | Freq: Every day | INTRAVENOUS | Status: AC | PRN
  Administered 2018-11-22: 500 [IU]
  Filled 2018-11-22: qty 5

## 2018-11-22 MED ORDER — DIPHENHYDRAMINE HCL 25 MG PO CAPS
25.0000 mg | ORAL_CAPSULE | Freq: Once | ORAL | Status: AC
  Administered 2018-11-22: 25 mg via ORAL

## 2018-11-22 MED ORDER — HEPARIN SOD (PORK) LOCK FLUSH 100 UNIT/ML IV SOLN
250.0000 [IU] | INTRAVENOUS | Status: DC | PRN
  Filled 2018-11-22: qty 5

## 2018-11-22 MED ORDER — SODIUM CHLORIDE 0.9% FLUSH
3.0000 mL | INTRAVENOUS | Status: DC | PRN
  Filled 2018-11-22: qty 10

## 2018-11-22 MED ORDER — SODIUM CHLORIDE 0.9% FLUSH
10.0000 mL | INTRAVENOUS | Status: AC | PRN
  Administered 2018-11-22: 10 mL
  Filled 2018-11-22: qty 10

## 2018-11-22 NOTE — Progress Notes (Signed)
HEMATOLOGY/ONCOLOGY CLINIC NOTE  Date of Service: 11/22/18     Patient Care Team: [name], FNP as PCP - General (Nurse Practitioner) Pixie Casino, MD as PCP - Cardiology (Cardiology)  CHIEF COMPLAINTS F/u for continued management of metastatic lung adenocarcinoma  Diagnosis Metastatic Lung Adenocarcinoma with rt flank subcutaneous metastasis.  Current treatment: Carboplatin/Alimta/Avastin  PreviousTreatment  -Concurrent Chemo-radiation with Carboplatin/Taxol. Completed RT on 02/13/2016 and has then completed 2 cycles of carboplatin + taxol. -Started Maintenance Durvalumab from  05/24/2016 q2weeks, switched to Nivolumab q2weeks on 12/13/16 once metastatic disease noted -s/p palliative RT to rt sided Subcutaneous metastatic mass.   Nivolumab q2 weeks Palliative RT to pain rt inguinal mass   HISTORY OF PRESENTING ILLNESS: Plz see my previous note for details on initial presentation.   INTERVAL HISTORY:  Marilyn [name] returns today for followup of her metastatic lung cancer. She is here for C3D1 carboplatin, Avastin, and Alimta. The patient's last visit with [place] was on 11/01/2018. The pt reports that she is doing well overall.  The pt reports that she was recently in the hospital with chest pain and pleural effusion.She had a series of XRs and CTs completed.  She continues on Carboplatin, Avastin, and Udenyca. Today is day 1 cycle 4. The pt has no prohibitive toxicities from continuing this treatment at this time.  Of note since the patient's last visit, pt has had Pleural Fluid Biopsy ([id number]) completed on 11/16/2018 with results revealing "malignant cells consistent with metastatic carcinoma"   Lab results today (11/22/18) of CBC w/diff and CMP is as follows: all values are WNL except for WBC at 11.5 K, RBC at 2.71, Hgb at 8.2, Hct at 25.1, RDW at 16.2, Neutro Abs at 9.1K, Abs Immature Granulocytes at 0.08K, Glucose at 218, Albumin at 3.0, Total  Bilirubin 0.2.   On review of systems, pt reports chest pain that worsens when she pushes on it that has improved, SOB, chills and denies fever, leg swelling, hand swelling and any other symptoms. She is also experiencing appetite loss and increased pigmentation in her leg. She had a concern with a pressure ulcer and is still on the Doxycycline antibiotic.The ulcer is still tender to the touch, draining fluids and she continues to apply antibiotic to the area. She is not experiencing dysuria but is having issues with incontinence. She has been keeping her blood sugar under control. The pt reports no abdominal pain, constipation or diahrrhea.     MEDICAL HISTORY:  Past Medical History:  Diagnosis Date   Arthritis    Asthma    Diabetes mellitus    Type II   Hemorrhoids    Hypercholesteremia    Hypertension    Hypothyroidism    Metastatic lung cancer (metastasis from lung to other site) (HCC)    Lung, Mets to skin- right flank. 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 mild - EF 45-50% by echo 10/2016 but EF 52% by low risk nuc 11/2016. b. Echo 07/2017 - EF normal, mild AI, unremarkable echo otherwise.   Obese    Pneumonia 2017   Sinus tachycardia    a. dates back to at least 2013, etiology unclear.    SURGICAL HISTORY: Past Surgical History:  Procedure Laterality Date   CESAREAN SECTION     COLONOSCOPY     I/D  arm Right 2013   IR FLUORO GUIDE PORT INSERTION RIGHT  02/18/2017   IR GENERIC HISTORICAL  01/08/2016   IR US GUIDE VASC ACCESS RIGHT 01/08/2016 [name], DO [facility]   IR GENERIC HISTORICAL  01/08/2016   IR FLUORO GUIDE CV LINE RIGHT 01/08/2016 [name], DO [facility]   IR THORACENTESIS ASP PLEURAL SPACE W/IMG GUIDE  05/25/2018   IR US GUIDE VASC ACCESS RIGHT  02/18/2017   MULTIPLE EXTRACTIONS WITH  ALVEOLOPLASTY N/A 03/25/2017   Procedure: MULTIPLE EXTRACTION WITH ALVEOLOPLASTY (teeth #s four, six, seven, eight, nine, ten, eleven, one, twenty-three, twenty-four, twenty-five, twenty-six, twenty-nine, thirty);  Surgeon: [name], [name];  Location: [facility];  Service: Oral Surgery;  Laterality: N/A;   TUBAL LIGATION     US guided core needle biopsy     of right lower neck/supraclavicular lymph nodes    SOCIAL HISTORY: Social History   Socioeconomic History   Marital status: Divorced    Spouse name: Not on file   Number of children: Not on file   Years of education: Not on file   Highest education level: Not on file  Occupational History   Not on file  Social Needs   Financial resource strain: Not on file   Food insecurity    Worry: Not on file    Inability: Not on file   Transportation needs    Medical: No    Non-medical: No  Tobacco Use   Smoking status: Never Smoker   Smokeless tobacco: Never Used  Substance and Sexual Activity   Alcohol use: No   Drug use: No   Sexual activity: Not Currently  Lifestyle   Physical activity    Days per week: Not on file    Minutes per session: Not on file   Stress: Not on file  Relationships   Social connections    Talks on phone: Not on file    Gets together: Not on file    Attends religious service: Not on file    Active member of club or organization: Not on file    Attends meetings of clubs or organizations: Not on file    Relationship status: Not on file   Intimate partner violence    Fear of current or ex partner: No    Emotionally abused: No    Physically abused: No    Forced sexual activity: No  Other Topics Concern   Not on file  Social History Narrative   No bird or mold exposure. No recent travel.   11-23-17 Unable to ask abuse questions mother with her today.    FAMILY HISTORY: Family History  Problem Relation Age of Onset   Heart failure Mother    Hypertension Mother    Cancer Neg  Hx    Rheumatologic disease Neg Hx     ALLERGIES:  has No Known Allergies.  MEDICATIONS:  Current Outpatient Medications  Medication Sig Dispense Refill   ACCU-CHEK AVIVA PLUS test strip CHECK BLOOD SUGAR THREE TIMES A DAY BEFORE MEALS DIRECTED     ASPIRIN LOW DOSE 81 MG EC tablet TAKE 1 TABLET BY MOUTH EVERY DAY (Patient taking differently: Take 81 mg by mouth daily. Enteric Coated.  ** DO NOT CRUSH **) 60 tablet 2   atorvastatin (LIPITOR) 40 MG tablet Take 1 tablet (40 mg total) by mouth daily at 6 PM. 30 tablet 0   dexamethasone (DECADRON) 4 MG tablet Take 1 tab two times a day the day before Alimta chemo. Take  2 tabs two times a day starting the day after chemo for 3 days. (Patient taking differently: Take 4-8 mg by mouth See admin instructions. Take 4 mg twice daily the day before Alimta chemo, then take 8 mg twice daily starting the day after chemo for 3 days.) 30 tablet 1   DULoxetine (CYMBALTA) 60 MG capsule TAKE 1 CAPSULE BY MOUTH EVERY DAY (Patient taking differently: Take 60 mg by mouth daily. ) 30 capsule 2   folic acid (FOLVITE) 1 MG tablet Take 1 tablet (1 mg total) by mouth daily. Start 5-7 days before Alimta chemotherapy. Continue until 21 days after Alimta completed. 100 tablet 3   glucose monitoring kit (FREESTYLE) monitoring kit 1 each by Does not apply route as needed for other. 1 each 0   hydrOXYzine (ATARAX/VISTARIL) 10 MG tablet Take 1 tablet (10 mg total) by mouth 3 (three) times daily as needed for itching. 30 tablet 0   insulin aspart (NOVOLOG) 100 UNIT/ML FlexPen As per sliding scale instructions (Patient taking differently: Inject 0-10 Units into the skin 3 (three) times daily. As per sliding scale instructions) 15 mL 2   Insulin Degludec (TRESIBA FLEXTOUCH) 200 UNIT/ML SOPN Inject 100 Units into the skin daily before breakfast.      levalbuterol (XOPENEX HFA) 45 MCG/ACT inhaler Inhale 1-2 puffs into the lungs every 8 (eight) hours as needed for wheezing. 1  Inhaler 12   levothyroxine (SYNTHROID) 150 MCG tablet TAKE 1 TABLET BY MOUTH EVERY DAY BEFORE BREAKFAST (Patient taking differently: Take 150 mcg by mouth daily before breakfast. ) 30 tablet 2   lidocaine-prilocaine (EMLA) cream Apply 1 application topically as needed. (Patient taking differently: Apply 1 application topically as needed (port access). ) 30 g 6   lidocaine-prilocaine (EMLA) cream Apply to affected area once (Patient not taking: Reported on 11/16/2018) 30 g 3   lidocaine-prilocaine (EMLA) cream Apply to affected area once (Patient not taking: Reported on 11/16/2018) 30 g 3   liver oil-zinc oxide (DESITIN) 40 % ointment Apply topically 4 (four) times daily -  with meals and at bedtime. Apply to coccyx/gluteal fold wound 56.7 g 0   LORazepam (ATIVAN) 0.5 MG tablet Take 1 tablet (0.5 mg total) by mouth every 6 (six) hours as needed (Nausea or vomiting). 30 tablet 0   magnesium oxide (MAG-OX) 400 (241.3 Mg) MG tablet Take 1 tablet (400 mg total) by mouth 2 (two) times daily. 60 tablet 2   metoprolol succinate (TOPROL-XL) 50 MG 24 hr tablet Take 1 tablet (34m) by mouth in the morning and 1/2 tablet (261m in the evening. Take with or immediately following a meal. (Patient taking differently: Take 25-50 mg by mouth 2 (two) times daily. Take 5036my mouth in the morning and 18m50m the evening. Take with or immediately following a meal.) 135 tablet 3   mupirocin ointment (BACTROBAN) 2 % Apply topically 2 (two) times daily. Apply into wound bed on right inner upper thigh.  Cover with a small foam dressing. Change the foam daily and prn 22 g 0   naproxen (NAPROSYN) 500 MG tablet Take 1 tablet (500 mg total) by mouth 2 (two) times daily with a meal. (Patient not taking: Reported on 08/10/2018) 20 tablet 0   nitrofurantoin, macrocrystal-monohydrate, (MACROBID) 100 MG capsule Take 1 capsule (100 mg total) by mouth every 12 (twelve) hours. (Patient not taking: Reported on 08/10/2018) 8 capsule 0     ondansetron (ZOFRAN) 8 MG tablet Take 1 tablet (8 mg total) by mouth  2 (two) times daily as needed for refractory nausea / vomiting. Start on day 3 after chemo. 30 tablet 1   oxyCODONE (OXY IR/ROXICODONE) 5 MG immediate release tablet Take 1 tablet (5 mg total) by mouth every 4 (four) hours as needed for severe pain. 15 tablet 0   senna-docusate (SENNA S) 8.6-50 MG tablet Take 2 tablets by mouth 2 (two) times daily. May reduce to 2 tab po HS as needed once regular BM estabished (Patient not taking: Reported on 11/16/2018) 60 tablet 1   traZODone (DESYREL) 50 MG tablet Take 1-2 tablets (50-100 mg total) by mouth at bedtime as needed for sleep. 60 tablet 0   Vitamins A & D (VITAMIN A & D) OINT Apply 1 application topically 2 (two) times daily. (Patient taking differently: Apply 1 application topically 2 (two) times daily as needed (itching). ) 1 Tube 3   No current facility-administered medications for this visit.    Facility-Administered Medications Ordered in Other Visits  Medication Dose Route Frequency Provider Last Rate Last Dose   heparin lock flush 100 unit/mL  250 Units Intracatheter PRN [name], MD       heparin lock flush 100 unit/mL  500 Units Intracatheter Daily PRN [name], MD       sodium chloride 0.9 % injection 10 mL  10 mL Intravenous PRN [name], MD   10 mL at 05/24/16 1252   sodium chloride flush (NS) 0.9 % injection 10 mL  10 mL Intracatheter PRN [name], MD       sodium chloride flush (NS) 0.9 % injection 3 mL  3 mL Intracatheter PRN [name], MD        REVIEW OF SYSTEMS:   A 10+ POINT REVIEW OF SYSTEMS WAS OBTAINED including neurology, dermatology, psychiatry, cardiac, respiratory, lymph, extremities, GI, GU, Musculoskeletal, constitutional, breasts, reproductive, HEENT.  All pertinent positives are noted in the HPI.  All others are negative.    PHYSICAL EXAMINATION: ECOG PERFORMANCE STATUS: 1 -  Symptomatic but completely ambulatory  Vitals:   11/22/18 1219  BP: (!) 160/94  Pulse: (!) 127  Resp: 18  Temp: 98.9 F (37.2 C)  SpO2: 100%   Filed Weights   11/22/18 1219  Weight: 259 lb 9.6 oz (117.8 kg)   .Body mass index is 38.34 kg/m.   GENERAL:alert, in no acute distress and comfortable SKIN: no acute rashes, draining abcess on upper-inner part of right thigh EYES: conjunctiva are pink and non-injected, sclera anicteric OROPHARYNX: MMM, no exudates, no oropharyngeal erythema or ulceration NECK: supple, no JVD LYMPH:  no palpable lymphadenopathy in the cervical, axillary or inguinal regions LUNGS: decreased breath sounds 3rd rib with normal respiratory effort HEART: regular rate & rhythm ABDOMEN:  normoactive bowel sounds , non tender, not distended. Extremity: no pedal edema PSYCH: alert & oriented x 3 with fluent speech NEURO: no focal motor/sensory deficits   LABORATORY DATA: .  [name] CBC Latest Ref Rng & Units 11/22/2018 11/17/2018 11/16/2018  WBC 4.0 - 10.5 K/uL 11.5(H) 9.6 10.0  Hemoglobin 12.0 - 15.0 g/dL 8.2(L) 7.9(L) 8.8(L)  Hematocrit 36.0 - 46.0 % 25.1(L) 25.1(L) 28.4(L)  Platelets 150 - 400 K/uL 172 81(L) 88(L)    . CMP Latest Ref Rng & Units 11/22/2018 11/17/2018 11/16/2018  Glucose 70 - 99 mg/dL 218(H) 183(H) -  BUN 6 - 20 mg/dL 9 8 -  Creatinine 0.44 - 1.00 mg/dL 0.92 0.72 0.90  Sodium 135 - 145 mmol/L 139 137 -  Potassium 3.5 - 5.1 mmol/L 4.2 4.3 -  Chloride 98 - 111 mmol/L 105 104 -  CO2 22 - 32 mmol/L 23 24 -  Calcium 8.9 - 10.3 mg/dL 9.2 8.9 -  Total Protein 6.5 - 8.1 g/dL 7.3 6.9 -  Total Bilirubin 0.3 - 1.2 mg/dL 0.2(L) 0.3 -  Alkaline Phos 38 - 126 U/L 118 118 -  AST 15 - 41 U/L 16 22 -  ALT 0 - 44 U/L 16 25 -    11/16/2018 Pleural Fluid Biopsy ([id number])   09/15/18 Pleural Fluid Cytology:             08/18/17 Bx:   08/16/17 Molecular Pathology:    RADIOGRAPHIC STUDIES:  .Dg Chest 1 View  Result Date:  11/16/2018 CLINICAL DATA:  Post right thoracentesis. History of stage IV lung cancer. EXAM: CHEST  1 VIEW COMPARISON:  09/15/2018 and chest CT 11/16/2018. FINDINGS: Volume loss in the right hemithorax. Hazy densities at the right lung base may represent atelectasis. Persistent densities in the right hilar region compatible with areas of consolidation or volume loss. Right hilar densities are unchanged. Negative for right pneumothorax. Lucency lateral to the right hilar soft tissue is similar to the prior chest radiograph. Left lung is clear. Heart and mediastinum are stable. IMPRESSION: 1. Negative for pneumothorax following the right thoracentesis. 2. Persistent volume loss in the right hemithorax with stable densities in the right hilar region. Electronically Signed   By: Markus Daft M.D.   On: 11/16/2018 13:32   Ct Angio Chest Pe W And/or Wo Contrast  Result Date: 11/16/2018 CLINICAL DATA:  Stage IV lung carcinoma. Chest pain and shortness of breath EXAM: CT ANGIOGRAPHY CHEST WITH CONTRAST TECHNIQUE: Multidetector CT imaging of the chest was performed using the standard protocol during bolus administration of intravenous contrast. Multiplanar CT image reconstructions and MIPs were obtained to evaluate the vascular anatomy. CONTRAST:  133m OMNIPAQUE IOHEXOL 350 MG/ML SOLN COMPARISON:  September 14, 2018 FINDINGS: Cardiovascular: There is no demonstrable pulmonary embolus. There is no thoracic aortic aneurysm or dissection. Visualized great vessels appear unremarkable. There is no pericardial effusion or pericardial thickening. Port-A-Cath tip is in the superior vena cava. Mediastinum/Nodes: Visualized thyroid appears unremarkable. There remains axillary adenopathy on the right. The largest lymph node in the right axillary region measures 3.4 x 2.5 cm, similar to prior study. There is left supraclavicular lymph node enlargement medially with focal lymph node in this area measuring 2.3 x 1.9 cm, essentially stable.  Right paratracheal adenopathy is also present with index lymph node in this area measuring 2.0 x 1.7 cm, essentially stable. No new adenopathy evident. No esophageal lesions appreciable. Lungs/Pleura: There is a sizable free-flowing pleural effusion on the right. The right perihilar region post radiation therapy change is stable. There is atelectatic change in this area as well. The contour of this area is stable compared to most recent CT. No well-defined mass seen in this area. Left lung is clear. Upper Abdomen: Visualized upper abdominal structures appear unremarkable. Musculoskeletal: No blastic or lytic bone lesions. Diffuse skin thickening in the right breast region is stable. Review of the MIP images confirms the above findings. IMPRESSION: 1. No demonstrable pulmonary embolus. No thoracic aortic aneurysm or dissection. 2. Sizable free-flowing pleural effusion on the right. Post radiation therapy change in the right perihilar region is stable. There is atelectasis in this area but no well-defined mass. Subtle neoplasm in this area is difficult to exclude, although the appearance is stable compared to most  recent CT. 3. Stable adenopathy in the right axillary, left supraclavicular, and right paratracheal regions. 4.  Persistent skin thickening right breast. Electronically Signed   By: Lowella Grip III M.D.   On: 11/16/2018 08:18   Dg Chest Port 1 View  Result Date: 11/16/2018 CLINICAL DATA:  Chest pain, onset yesterday. Lung cancer with active chemotherapy. Thoracentesis earlier today. EXAM: PORTABLE CHEST 1 VIEW COMPARISON:  Radiographs and CT earlier this day. FINDINGS: Right chest port remains in place. Unchanged volume loss in the right hemithorax. No pneumothorax. Right pleural fluid better demonstrated on prior CT. Unchanged heart size and mediastinal contours with right hilar prominence, stable. Left lung is clear. IMPRESSION: No pneumothorax following right thoracentesis. Unchanged right  hemithorax volume loss and hilar density. Electronically Signed   By: Keith Rake M.D.   On: 11/16/2018 18:55   [place] Rt Lower Extrem Ltd Soft Tissue Non Vascular  Result Date: 11/16/2018 CLINICAL DATA:  Open wound of the right inner thigh. Evaluate for deep abscess. EXAM: ULTRASOUND right LOWER EXTREMITY LIMITED TECHNIQUE: Ultrasound examination of the lower extremity soft tissues was performed in the area of clinical concern. COMPARISON:  None. FINDINGS: Scanning in the region of concern does not show any evidence of a deep fluid collection. Mild nonspecific edema of the subcutaneous tissues. IMPRESSION: No visible abscess. Electronically Signed   By: Nelson Chimes M.D.   On: 11/16/2018 19:18   US Thoracentesis Asp Pleural Space W/img Guide  Result Date: 11/16/2018 INDICATION: History of metastatic lung cancer, recurrent right pleural effusion, dyspnea. Request for diagnostic and therapeutic right thoracentesis. EXAM: ULTRASOUND GUIDED RIGHT THORACENTESIS MEDICATIONS: 1% lidocaine 10 mL COMPLICATIONS: None immediate. PROCEDURE: An ultrasound guided thoracentesis was thoroughly discussed with the patient and questions answered. The benefits, risks, alternatives and complications were also discussed. The patient understands and wishes to proceed with the procedure. Written consent was obtained. Ultrasound was performed to localize and mark an adequate pocket of fluid in the right chest. The area was then prepped and draped in the normal sterile fashion. 1% Lidocaine was used for local anesthesia. Under ultrasound guidance a 6 Fr Safe-T-Centesis catheter was introduced. Thoracentesis was performed. The catheter was removed and a dressing applied. FINDINGS: A total of approximately 450 mL of hazy yellow fluid was removed. Samples were sent to the laboratory as requested by the clinical team. IMPRESSION: Successful ultrasound guided right thoracentesis yielding 450 mL of pleural fluid. No pneumothorax on  post-procedure chest x-ray. Read by: Gareth Eagle, PA-C Electronically Signed   By: Jacqulynn Cadet M.D.   On: 11/16/2018 14:11     ASSESSMENT & PLAN:  53 y.o.  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  -Noted to have a small mass in the left adrenal on CT but PET/CT neg for metastatic disease. Patient has been a lifelong nonsmoker -MRI of the brain was negative for any metastatic disease. At diagnosis. -High PDL1 expression (90%) on [facility] One Neg for EGFR, ALK, ROS-1 and BRAF mutations. -Patient completed her planned definitive chemoradiation with Carbo Taxol on 02/13/2016. No prohibitive toxicities other than some grade 1 skin desquamation and some grade 1-2 radiation esophagitis. She has subsequently received 2 out of 2 planned dose of carboplatin + Taxol. -  CTA chest 10/29/2016  no evidence of lung cancer progression in the chest. No PE -CT abd/pelvis-10/29/2016  Interval 5.0 x 5.0 x 4.7 cm mass in the right lateral subcutaneous fat at the level of the mid abdomen. This has CT features compatible with a metastasis or primary neoplasm. -CT C/A/P (05/12/2017): Interval development of right axillary, right external iliac and right inguinal adenopathy. Suspicious for nodal metastasis. 2. Decrease in size of right paratracheal adenopathy. 3. Decrease in size and enhancement associated with right lateral body wall lesion. 4. New small nonspecific pulmonary nodule in the left lower lobe measuring 6 mm. 5. Stable appearance of changes secondary to external beam radiation within the right lung  08/03/17 PET which revealed 2.8 cm right axillary lymph node is markedly hypermetabolic  and consistent with metastatic disease. Extensive radiation changes involving the right paramediastinal lung. There is a focus of hypermetabolism in the right lower lobe which is suspicious for residual or recurrent tumor.  11/11/17 CT C/A/P revealed There has been interval increase in number of enlarged of right axillary nodes and increase in size of right inguinal adenopathy compatible with progression of disease. Stable appearance of the right lung with changes of external beam radiation.    03/01/18 CT C/A/P revealed Bulky right subpectoral/axillary adenopathy, markedly progressive from 11/11/2017. Enlarging retrocrural lymph node. Low right paratracheal adenopathy is stable. 2. Interval resection of a right inguinal nodal mass. Stable right external iliac lymph node. 3. Cholelithiasis.  Stable common bile duct dilatation. 4.  Aortic atherosclerosis.  she received maintenance Durvalumab from  05/24/2016 q2weeks, switched to Nivolumab q2weeks on 12/13/16 in the setting of Biopsy proven isolated metastatic disease. Given the tumor is strongly PDL1 positive and patient has only isolated metastatic disease was treated with palliative RT to metastases with significant improvement. Molecular pathology from 515/19 did not reveal obviously targetable mutations with NF1 and TP53 mutations present.  05/24/18 CT Chest revealed Mixed interval change. 2. Moderate dependent right pleural effusion, significantly increased. 3. Right axillary adenopathy is decreased. 4. New left supraclavicular and left posterior mediastinal lymphadenopathy. Increased right retrocrural adenopathy. These findings are worrisome for progression of metastatic nodal disease. 5. New mild patchy nodular consolidation in the peripheral right middle lobe, which could be inflammatory or neoplastic. 6. Similar finely nodular patchy thickening of the peripheral peribronchovascular interstitium and interlobular septa in the lower lungs bilaterally,  nonspecific, lymphangitic tumor not excluded. 7. Nonspecific increased right breast skin thickening, possibly treatment related. Aortic Atherosclerosis.  05/30/18 CT Abdomen revealed Cholelithiasis, without associated inflammatory changes to suggest acute cholecystitis. Moderate right pleural effusion. Please note that the pelvis was not imaged.  07/21/18 CT Chest revealed "Progressive left supraclavicular lymphadenopathy. Right axillary, mediastinal and hilar adenopathy is grossly stable, suboptimally evaluated without contrast. These findings remain consistent with metastatic disease. 2. Slight enlargement of moderate-sized right pleural effusion with extension into the minor fissure. This limits evaluation of the previously demonstrated right middle lobe nodular density. This effusion could be malignant. No discretely enlarging pulmonary nodules identified. 3. Stable skin thickening in the right breast. 4. PET CT follow-up may be helpful to assess for hypermetabolism in the lymph nodes and right pleural space."  Pt was previously seen to have progression through immunotherapy treatment and have been holding Nivolumab  09/14/18 CT Chest revealed "Persistent right axillary, mediastinal, and left supraclavicular adenopathy. Stable to mild increase in size of these lymph nodes period. 2. Stable to mild decrease in size of moderate size right pleural effusion. 3. Stable skin thickening  of the right breast." S/p 09/15/18 Thoracentesis removed 1L of fluid 09/15/18 Cytology revealed malignant cells in the removed pleural fluid  #3 h/o radiation pneumonitis - no new symptoms. Does has some cough with exertion but this has improved.  #4 Improving fatigue (grade 1) - improved fatigue with levothyroxine compliance with near normalization of FT4 levels.  - previously given TSH of 12 -- increased Levothyroxine from 149mg to 1562m  ---will likely need larger dose but in the setting of baseline tachycardia we are  gradually increasing dose to avoid ppt cardiac arrhythmias.  TSH elevated due to medication non compliance. TSH 6.24, FT4 0.6  #5 normocytic anemia related to chemotherapy -resolving and stable  #6 Left chest wall pain due to costochondritis - mx with pain medications. CTA x 3 neg for PE. No significant pain at this time.  #7 Sinus tachycardia - on BB per cards , no PE on CTA x 3 , -Still having sinus tachycardia . Partly from deconditioning and anemia. Seeing Dr. HiDebara Pickettrom cardiology  -Continues to be on BB  #8 neoplasm related pain is currently controlled without significant medications. Clinically likely has sleep apnea . Would need to be careful with sedative medications . 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  -Discussed 11/16/2018 Pleural Fluid Biopsy ([id number] results revealing "malignant cells consistent with metastatic carcinoma" -Discussed pts anemia -Discussed improving blood counts, especially Hgb to improve breathing. Will transfuse PRBC today. --Discussed pts Diabetes management. -Discussed potentially going to maintenance chemotherapy depending on future lab results.  -Discussed having additional fluid removed from her lungs before an additional CT scan if her SOB continues.   FOLLOW UP: No chemotherapy today Shall be giving 1 unit of PRBC today CT Abd/pelvis and Bone scan in 1 week Plz schedule for next cycle of treatment with Alimta/Avastin (dropping  carboplatin) in 2 weeks with labs and MD visit  The total time spent in the appt was 25 minutes and more than 50% was on counseling and direct patient cares.  All of the patient's questions were answered with apparent satisfaction. The patient knows to call the clinic with any problems, questions or concerns.   GautSullivan LoneMS AAHIVMS SCH [facility]/Oncology Physician [facility]):       [phone] cell):  [phone]):           [phone] AmbeJacqualyn Posey acting as a scribe for Dr. GautSullivan Lone.I have reviewed the above documentation for accuracy and completeness, and I agree with the above. .GauBrunetta Genera

## 2018-11-22 NOTE — Patient Instructions (Signed)

## 2018-11-22 NOTE — Progress Notes (Signed)
1700: Port flushed with saline easily x2. No blood return noted. Pt's position changed w/ no blood. Blood return. Next saline flush would not flush at all, resistance met. Another RN assessed port with no success. Sandi Mealy called to bedside, ok Pt deaccessed w/o heparin lock. Pt instructed to use ice as needed for any discomfort.

## 2018-11-23 ENCOUNTER — Telehealth: Payer: Self-pay | Admitting: Hematology

## 2018-11-23 LAB — TYPE AND SCREEN
ABO/RH(D): A POS
Antibody Screen: NEGATIVE
Unit division: 0

## 2018-11-23 LAB — BPAM RBC
Blood Product Expiration Date: 202009142359
ISSUE DATE / TIME: 202008191427
Unit Type and Rh: 6200

## 2018-11-23 NOTE — Progress Notes (Signed)
The patient was being transfused with packed red blood cells today.  This provider was contacted when the patient's port was flushed with saline easily x2. No blood return was noted. The patient's position was changed with no blood. Blood returned however a saline flush would not flush at all. Resistance was noted. Another nurse assessed the port but no success. The port was deaccessed without a heparin lock. The patient was instructed to use ice as needed for any discomfort.  Sandi Mealy, MHS, PA-C Physician Assistant

## 2018-11-23 NOTE — Telephone Encounter (Signed)
Scheduled appt per 8/19 los.  Spoke with patient and she is aware of her appt date and time.

## 2018-11-24 ENCOUNTER — Other Ambulatory Visit: Payer: Self-pay

## 2018-11-24 ENCOUNTER — Inpatient Hospital Stay: Payer: Medicare Other

## 2018-11-24 NOTE — Progress Notes (Signed)
No Udenyca injection today per Dr. Irene Limbo

## 2018-11-27 ENCOUNTER — Other Ambulatory Visit: Payer: Self-pay | Admitting: Hematology

## 2018-11-27 DIAGNOSIS — C3491 Malignant neoplasm of unspecified part of right bronchus or lung: Secondary | ICD-10-CM

## 2018-11-29 ENCOUNTER — Encounter (HOSPITAL_COMMUNITY)
Admission: RE | Admit: 2018-11-29 | Discharge: 2018-11-29 | Disposition: A | Discharge status: Home / Self Care | Payer: Medicare Other | Source: Ambulatory Visit | Attending: Hematology | Admitting: Hematology

## 2018-11-29 ENCOUNTER — Ambulatory Visit (HOSPITAL_COMMUNITY)
Admission: RE | Admit: 2018-11-29 | Discharge: 2018-11-29 | Disposition: A | Discharge status: Home / Self Care | Payer: Medicare Other | Source: Ambulatory Visit | Attending: Hematology | Admitting: Hematology

## 2018-11-29 ENCOUNTER — Encounter (HOSPITAL_COMMUNITY): Payer: Self-pay

## 2018-11-29 ENCOUNTER — Other Ambulatory Visit: Payer: Self-pay

## 2018-11-29 DIAGNOSIS — J910 Malignant pleural effusion: Secondary | ICD-10-CM

## 2018-11-29 DIAGNOSIS — J90 Pleural effusion, not elsewhere classified: Secondary | ICD-10-CM | POA: Diagnosis not present

## 2018-11-29 DIAGNOSIS — C3491 Malignant neoplasm of unspecified part of right bronchus or lung: Secondary | ICD-10-CM

## 2018-11-29 DIAGNOSIS — K8020 Calculus of gallbladder without cholecystitis without obstruction: Secondary | ICD-10-CM | POA: Diagnosis not present

## 2018-11-29 DIAGNOSIS — C3490 Malignant neoplasm of unspecified part of unspecified bronchus or lung: Secondary | ICD-10-CM | POA: Diagnosis not present

## 2018-11-29 MED ORDER — HEPARIN SOD (PORK) LOCK FLUSH 100 UNIT/ML IV SOLN
500.0000 [IU] | Freq: Once | INTRAVENOUS | Status: AC
  Administered 2018-11-29: 500 [IU] via INTRAVENOUS

## 2018-11-29 MED ORDER — TECHNETIUM TC 99M MEDRONATE IV KIT
21.0000 | PACK | Freq: Once | INTRAVENOUS | Status: AC | PRN
  Administered 2018-11-29: 09:00:00 21 via INTRAVENOUS

## 2018-11-29 MED ORDER — IOHEXOL 300 MG/ML  SOLN
100.0000 mL | Freq: Once | INTRAMUSCULAR | Status: AC | PRN
  Administered 2018-11-29: 100 mL via INTRAVENOUS

## 2018-11-29 MED ORDER — SODIUM CHLORIDE (PF) 0.9 % IJ SOLN
INTRAMUSCULAR | Status: AC
  Filled 2018-11-29: qty 50

## 2018-11-29 MED ORDER — HEPARIN SOD (PORK) LOCK FLUSH 100 UNIT/ML IV SOLN
INTRAVENOUS | Status: AC
  Filled 2018-11-29: qty 5

## 2018-11-29 MED ORDER — IOHEXOL 300 MG/ML  SOLN
30.0000 mL | Freq: Once | INTRAMUSCULAR | Status: AC | PRN
  Administered 2018-11-29: 30 mL via ORAL

## 2018-12-05 NOTE — Progress Notes (Signed)
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 images confirms the above findings. IMPRESSION: 1. No demonstrable pulmonary embolus. No thoracic aortic aneurysm or dissection. 2. Sizable free-flowing pleural effusion on the right. Post radiation therapy change in the right perihilar region is stable. There is atelectasis in this area but no well-defined mass. Subtle neoplasm in this area is difficult to exclude, although the appearance is stable compared to most recent CT. 3. Stable adenopathy in the right axillary, left supraclavicular, and right paratracheal regions. 4.  Persistent skin thickening right breast. Electronically Signed   By: WLowella GripIII M.D.   On: 11/16/2018 08:18   Nm Bone Scan Whole Body  Result Date: 11/29/2018 CLINICAL DATA:  Non-small cell lung cancer EXAM: NUCLEAR MEDICINE WHOLE  BODY BONE SCAN TECHNIQUE: Whole body anterior and posterior images were obtained approximately 3 hours after intravenous injection of radiopharmaceutical. RADIOPHARMACEUTICALS:  21 mCi Technetium-27mMDP IV COMPARISON:  Correlation: PET-CT  08/03/2017, CT chest abdomen pelvis 11/29/2018 FINDINGS: Decreased tracer localization is identified within upper thoracic spine and in upper LEFT ribs. Retained tracer within RIGHT jugular Port-A-Cath reservoir and tubing. Questionable foci of increased tracer accumulation at the calvarium especially posterior RIGHT temporal region and LEFT parietal region, nonspecific but cannot exclude osseous metastases. Nonspecific uptake of tracer at the distal sternum/xiphoid process, no abnormality seen on CT. No additional worrisome sites of osseous tracer accumulation are identified. Asymmetric tracer retention within the RIGHT renal collecting system versus LEFT though no hydronephrosis is identified on CT. Diffuse uptake of tracer in the RIGHT hemithorax corresponding to pleural effusion on CT. Remaining urinary tract and soft tissue distribution of tracer is unremarkable. IMPRESSION: Questionable foci of increased tracer accumulation in the calvaria and at the distal sternum/xiphoid process, uncertain etiology, cannot exclude osseous metastatic disease No other worrisome sites of osseous tracer accumulation are identified. Decreased tracer uptake at the thoracic spine and at upper LEFT ribs, nonspecific, can be seen at radiation therapy ports recommend correlation with patient history. RIGHT pleural effusion. Electronically Signed   By: MLavonia DanaM.D.   On: 11/29/2018 16:33   Ct Abdomen Pelvis W Contrast  Result Date: 11/29/2018 CLINICAL DATA:  53year old female with history of stage IV lung cancer undergoing ongoing chemotherapy. Mid chest pain for the past 3 weeks. Cough and shortness of breath. Follow-up study. EXAM: CT CHEST, ABDOMEN, AND PELVIS WITH CONTRAST TECHNIQUE: Multidetector CT imaging of the chest, abdomen and pelvis was performed following the standard protocol during bolus administration of intravenous contrast. CONTRAST:  1060mOMNIPAQUE IOHEXOL 300 MG/ML  SOLN COMPARISON:  Chest CT 11/16/2018.  CT the abdomen and pelvis 05/30/2018. Multiple other priors. FINDINGS: CT CHEST FINDINGS Cardiovascular: Heart size is normal. There is no significant pericardial fluid, thickening or pericardial calcification. There is aortic atherosclerosis, as well as atherosclerosis of the great vessels of the mediastinum and the coronary arteries, including calcified atherosclerotic plaque in the left anterior descending, left circumflex and right coronary arteries. Mediastinum/Nodes: Low right paratracheal lymph node measuring 1.7 cm in short axis. Esophagus is unremarkable in appearance. Enlarged right axillary lymph node measuring 2.7 cm in short axis, similar to the prior study. Other smaller right axillary lymph nodes are also noted. Lungs/Pleura: Chronic mass-like areas of architectural distortion in the perihilar aspect of the right upper lobe and superior segment of the right lower lobe, similar to the prior study, most compatible with chronic postradiation mass-like fibrosis. Moderate right pleural effusion lying dependently. No acute consolidative airspace disease. No pleural effusions. No suspicious appearing pulmonary nodules or masses are noted. Musculoskeletal: There are no aggressive appearing lytic or blastic lesions noted in the visualized portions of the skeleton. CT ABDOMEN PELVIS FINDINGS Hepatobiliary: Ill-defined low-attenuation area in segment 4B of the liver adjacent to the falciform ligament, incompletely characterize, but similar to focal low-attenuation seen on prior study 05/30/2018, most compatible with focal fatty infiltration. No other suspicious cystic or solid hepatic lesions. No intra or extrahepatic biliary ductal dilatation. Gallbladder is contracted around several noncalcified gallstones. No surrounding pericholecystic fluid or inflammatory changes to suggest an acute cholecystitis at this time. Pancreas: No pancreatic mass. No pancreatic ductal dilatation. No pancreatic or peripancreatic  fluid collections or inflammatory changes. Spleen: Unremarkable. Adrenals/Urinary Tract: Bilateral kidneys and adrenal glands are normal in appearance.  No hydroureteronephrosis. Urinary bladder is normal in appearance. Stomach/Bowel: Normal appearance of the stomach. No pathologic dilatation of small bowel or colon. The appendix is not confidently identified and may be surgically absent. Regardless, there are no inflammatory changes noted adjacent to the cecum to suggest the presence of an acute appendicitis at this time. Vascular/Lymphatic: Aortic atherosclerosis, without evidence of aneurysm or dissection in the abdominal or pelvic vasculature. Multiple prominent borderline enlarged and mildly enlarged retroperitoneal and pelvic lymph nodes. The largest retroperitoneal lymph nodes measure up to 1 cm in short axis in the aortocaval nodal station (axial image 75 of series 2). The largest pelvic lymph nodes are in the right internal iliac nodal chain measuring up to 1.5 cm in short axis on axial image 103 of series 2 and 109 of series 2. 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 Ovaries are unremarkable in appearance. Other: No significant volume of ascites.  No pneumoperitoneum. Musculoskeletal: There are no aggressive appearing lytic or blastic lesions noted in the visualized portions of the skeleton. IMPRESSION: 1. Persistent low right paratracheal lymphadenopathy, similar to the prior study. This may reflect residual disease. Stable area of presumed postradiation mass-like fibrosis in the perihilar aspect of the right upper lung. 2. Moderate right pleural effusion is similar to the prior examination. 3. Lymphadenopathy also noted in the right axillary region, right hemipelvis and retroperitoneum, as above, potentially reflective of metastatic disease. 4. Focal low-attenuation region in segment 4B of the liver, similar to prior noncontrast CT  examination of the abdomen, likely to represent focal fatty infiltration. 5. Cholelithiasis without evidence of acute cholecystitis at this time. 6. Aortic atherosclerosis, in addition to 3 vessel coronary artery disease. Please note that although the presence of coronary artery calcium documents the presence of coronary artery disease, the severity of this disease and any potential stenosis cannot be assessed on this non-gated CT examination. Assessment for potential risk factor modification, dietary therapy or pharmacologic therapy may be warranted, if clinically indicated. 7. Additional incidental findings, as above. Electronically Signed   By: Vinnie Langton M.D.   On: 11/29/2018 14:09   Dg Chest Port 1 View  Result Date: 11/16/2018 CLINICAL DATA:  Chest pain, onset yesterday. Lung cancer with active chemotherapy. Thoracentesis earlier today. EXAM: PORTABLE CHEST 1 VIEW COMPARISON:  Radiographs and CT earlier this day. FINDINGS: Right chest port remains in place. Unchanged volume loss in the right hemithorax. No pneumothorax. Right pleural fluid better demonstrated on prior CT. Unchanged heart size and mediastinal contours with right hilar prominence, stable. Left lung is clear. IMPRESSION: No pneumothorax following right thoracentesis. Unchanged right hemithorax volume loss and hilar density. Electronically Signed   By: Keith Rake M.D.   On: 11/16/2018 18:55   [place] Rt Lower Extrem Ltd Soft Tissue Non Vascular  Result Date: 11/16/2018 CLINICAL DATA:  Open wound of the right inner thigh. Evaluate for deep abscess. EXAM: ULTRASOUND right LOWER EXTREMITY LIMITED TECHNIQUE: Ultrasound examination of the lower extremity soft tissues was performed in the area of clinical concern. COMPARISON:  None. FINDINGS: Scanning in the region of concern does not show any evidence of a deep fluid collection. Mild nonspecific edema of the subcutaneous tissues. IMPRESSION: No visible abscess. Electronically Signed    By: Nelson Chimes M.D.   On: 11/16/2018 19:18   US Thoracentesis Asp Pleural Space W/img Guide  Result Date: 11/16/2018 INDICATION: History of metastatic lung cancer, recurrent right pleural effusion, dyspnea. Request for diagnostic and therapeutic right  thoracentesis. EXAM: ULTRASOUND GUIDED RIGHT THORACENTESIS MEDICATIONS: 1% lidocaine 10 mL COMPLICATIONS: None immediate. PROCEDURE: An ultrasound guided thoracentesis was thoroughly discussed with the patient and questions answered. The benefits, risks, alternatives and complications were also discussed. The patient understands and wishes to proceed with the procedure. Written consent was obtained. Ultrasound was performed to localize and mark an adequate pocket of fluid in the right chest. The area was then prepped and draped in the normal sterile fashion. 1% Lidocaine was used for local anesthesia. Under ultrasound guidance a 6 Fr Safe-T-Centesis catheter was introduced. Thoracentesis was performed. The catheter was removed and a dressing applied. FINDINGS: A total of approximately 450 mL of hazy yellow fluid was removed. Samples were sent to the laboratory as requested by the clinical team. IMPRESSION: Successful ultrasound guided right thoracentesis yielding 450 mL of pleural fluid. No pneumothorax on post-procedure chest x-ray. Read by: Gareth Eagle, PA-C Electronically Signed   By: Jacqulynn Cadet M.D.   On: 11/16/2018 14:11     ASSESSMENT & PLAN:  53 y.o.  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  -Noted to have a small mass in the left adrenal on CT but PET/CT neg for metastatic disease. Patient has been a lifelong  nonsmoker -MRI of the brain was negative for any metastatic disease. At diagnosis. -High PDL1 expression (90%) on [facility] One Neg for EGFR, ALK, ROS-1 and BRAF mutations. -Patient completed her planned definitive chemoradiation with Carbo Taxol on 02/13/2016. No prohibitive toxicities other than some grade 1 skin desquamation and some grade 1-2 radiation esophagitis. She has subsequently received 2 out of 2 planned dose of carboplatin + Taxol. -CTA chest 10/29/2016  no evidence of lung cancer progression in the chest. No PE -CT abd/pelvis-10/29/2016  Interval 5.0 x 5.0 x 4.7 cm mass in the right lateral subcutaneous fat at the level of the mid abdomen. This has CT features compatible with a metastasis or primary neoplasm. -CT C/A/P (05/12/2017): Interval development of right axillary, right external iliac and right inguinal adenopathy. Suspicious for nodal metastasis. 2. Decrease in size of right paratracheal adenopathy. 3. Decrease in size and enhancement associated with right lateral body wall lesion. 4. New small nonspecific pulmonary nodule in the left lower lobe measuring 6 mm. 5. Stable appearance of changes secondary to external beam radiation within the right lung  08/03/17 PET which revealed 2.8 cm right axillary lymph node is markedly hypermetabolic and consistent with metastatic disease. Extensive radiation changes involving the right paramediastinal lung. There is a focus of hypermetabolism in the right lower lobe which is suspicious for residual or recurrent tumor.  11/11/17 CT C/A/P revealed There has been interval increase in number of enlarged of right axillary nodes and increase in size of right inguinal adenopathy compatible with progression of disease. Stable appearance of the right lung with changes of external beam radiation.    03/01/18 CT C/A/P revealed Bulky right subpectoral/axillary adenopathy, markedly progressive from 11/11/2017. Enlarging retrocrural lymph node. Low right  paratracheal adenopathy is stable. 2. Interval resection of a right inguinal nodal mass. Stable right external iliac lymph node. 3. Cholelithiasis.  Stable common bile duct dilatation. 4.  Aortic atherosclerosis.  she received maintenance Durvalumab from  05/24/2016 q2weeks, switched to  Nivolumab q2weeks on 12/13/16 in the setting of Biopsy proven isolated metastatic disease. Given the tumor is strongly PDL1 positive and patient has only isolated metastatic disease was treated with palliative RT to metastases with significant improvement. Molecular pathology from 515/19 did not reveal obviously targetable mutations with NF1 and TP53 mutations present.  05/24/18 CT Chest revealed Mixed interval change. 2. Moderate dependent right pleural effusion, significantly increased. 3. Right axillary adenopathy is decreased. 4. New left supraclavicular and left posterior mediastinal lymphadenopathy. Increased right retrocrural adenopathy. These findings are worrisome for progression of metastatic nodal disease. 5. New mild patchy nodular consolidation in the peripheral right middle lobe, which could be inflammatory or neoplastic. 6. Similar finely nodular patchy thickening of the peripheral peribronchovascular interstitium and interlobular septa in the lower lungs bilaterally, nonspecific, lymphangitic tumor not excluded. 7. Nonspecific increased right breast skin thickening, possibly treatment related. Aortic Atherosclerosis.  05/30/18 CT Abdomen revealed Cholelithiasis, without associated inflammatory changes to suggest acute cholecystitis. Moderate right pleural effusion. Please note that the pelvis was not imaged.  07/21/18 CT Chest revealed "Progressive left supraclavicular lymphadenopathy. Right axillary, mediastinal and hilar adenopathy is grossly stable, suboptimally evaluated without contrast. These findings remain consistent with metastatic disease. 2. Slight enlargement of moderate-sized right pleural effusion  with extension into the minor fissure. This limits evaluation of the previously demonstrated right middle lobe nodular density. This effusion could be malignant. No discretely enlarging pulmonary nodules identified. 3. Stable skin thickening in the right breast. 4. PET CT follow-up may be helpful to assess for hypermetabolism in the lymph nodes and right pleural space."  Pt was previously seen to have progression through immunotherapy treatment and have been holding Nivolumab  09/14/18 CT Chest revealed "Persistent right axillary, mediastinal, and left supraclavicular adenopathy. Stable to mild increase in size of these lymph nodes period. 2. Stable to mild decrease in size of moderate size right pleural effusion. 3. Stable skin thickening of the right breast." S/p 09/15/18 Thoracentesis removed 1L of fluid 09/15/18 Cytology revealed malignant cells in the removed pleural fluid  11/16/2018 Pleural Fluid Biopsy ([id number]) with results revealing "malignant cells consistent with metastatic carcinoma"  #3 h/o radiation pneumonitis - no new symptoms. Does has some cough with exertion but this has improved.  #4 Improving fatigue (grade 1) - improved fatigue with levothyroxine compliance with near normalization of FT4 levels.  - previously given TSH of 12 -- increased Levothyroxine from 123mg to 1577m  ---will likely need larger dose but in the setting of baseline tachycardia we are gradually increasing dose to avoid ppt cardiac arrhythmias.  TSH elevated due to medication non compliance. TSH 6.24, FT4 0.6  #5 normocytic anemia related to chemotherapy -resolving and stable  #6 Left chest wall pain due to costochondritis - mx with pain medications. CTA x 3 neg for PE. No significant pain at this time.  #7 Sinus tachycardia - on BB per cards , no PE on CTA x 3 , -Still having sinus tachycardia . Partly from deconditioning and anemia. Seeing Dr. HiDebara Pickettrom cardiology  -Continues to be on BB  #8  neoplasm related pain is currently controlled without significant medications. Clinically likely has sleep apnea . Would need to be careful with sedative medications . 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 -Discussed 12/06/2018 Magnesium is 1.2 -Will hold Carboplatin today  - will proceed with maintenance Alimta + Avastin -Rx Senna, Acid Suppressant(protonox), Diclofenac ointment -will need to monitor for increased SOB and consider rpt therapeutic thoracentesis as needed -Will see back in 3 weeks   FOLLOW UP: RTC with Dr Irene Limbo with labs and next cycle of Alimta/Avastin chemotherapy in 3 weeks  The total time spent in the appt was 25 minutes and more than 50% was on counseling and direct patient cares.  All of the patient's questions were answered with apparent satisfaction. The patient knows to call the clinic with any problems, questions or concerns.   Sullivan Lone MD [place] AAHIVMS [facility] Hematology/Oncology Physician [facility]  (Office):       [phone] (Work cell):  [phone] (Fax):           [phone]  I, Yevette Edwards, am acting as a scribe for Dr. Sullivan Lone.   .I have reviewed the above documentation for accuracy and completeness, and I agree with the above. [name] MD

## 2018-12-06 ENCOUNTER — Inpatient Hospital Stay: Payer: Medicare Other

## 2018-12-06 ENCOUNTER — Other Ambulatory Visit: Payer: Self-pay

## 2018-12-06 ENCOUNTER — Inpatient Hospital Stay (HOSPITAL_BASED_OUTPATIENT_CLINIC_OR_DEPARTMENT_OTHER): Payer: Medicare Other | Admitting: Hematology

## 2018-12-06 ENCOUNTER — Inpatient Hospital Stay: Payer: Medicare Other | Attending: Hematology

## 2018-12-06 VITALS — BP 139/92 | HR 84

## 2018-12-06 DIAGNOSIS — G893 Neoplasm related pain (acute) (chronic): Secondary | ICD-10-CM | POA: Insufficient documentation

## 2018-12-06 DIAGNOSIS — Z7189 Other specified counseling: Secondary | ICD-10-CM

## 2018-12-06 DIAGNOSIS — T451X5A Adverse effect of antineoplastic and immunosuppressive drugs, initial encounter: Secondary | ICD-10-CM | POA: Insufficient documentation

## 2018-12-06 DIAGNOSIS — R630 Anorexia: Secondary | ICD-10-CM | POA: Insufficient documentation

## 2018-12-06 DIAGNOSIS — J90 Pleural effusion, not elsewhere classified: Secondary | ICD-10-CM | POA: Insufficient documentation

## 2018-12-06 DIAGNOSIS — I2510 Atherosclerotic heart disease of native coronary artery without angina pectoris: Secondary | ICD-10-CM | POA: Insufficient documentation

## 2018-12-06 DIAGNOSIS — Z5112 Encounter for antineoplastic immunotherapy: Secondary | ICD-10-CM | POA: Insufficient documentation

## 2018-12-06 DIAGNOSIS — K5900 Constipation, unspecified: Secondary | ICD-10-CM | POA: Diagnosis not present

## 2018-12-06 DIAGNOSIS — R112 Nausea with vomiting, unspecified: Secondary | ICD-10-CM | POA: Diagnosis not present

## 2018-12-06 DIAGNOSIS — I700 Atherosclerosis of aorta: Secondary | ICD-10-CM | POA: Diagnosis not present

## 2018-12-06 DIAGNOSIS — E039 Hypothyroidism, unspecified: Secondary | ICD-10-CM | POA: Insufficient documentation

## 2018-12-06 DIAGNOSIS — Z79899 Other long term (current) drug therapy: Secondary | ICD-10-CM | POA: Insufficient documentation

## 2018-12-06 DIAGNOSIS — E1165 Type 2 diabetes mellitus with hyperglycemia: Secondary | ICD-10-CM | POA: Insufficient documentation

## 2018-12-06 DIAGNOSIS — G629 Polyneuropathy, unspecified: Secondary | ICD-10-CM | POA: Diagnosis not present

## 2018-12-06 DIAGNOSIS — M129 Arthropathy, unspecified: Secondary | ICD-10-CM | POA: Insufficient documentation

## 2018-12-06 DIAGNOSIS — I428 Other cardiomyopathies: Secondary | ICD-10-CM | POA: Diagnosis not present

## 2018-12-06 DIAGNOSIS — R000 Tachycardia, unspecified: Secondary | ICD-10-CM | POA: Insufficient documentation

## 2018-12-06 DIAGNOSIS — Z5111 Encounter for antineoplastic chemotherapy: Secondary | ICD-10-CM | POA: Insufficient documentation

## 2018-12-06 DIAGNOSIS — R5383 Other fatigue: Secondary | ICD-10-CM | POA: Diagnosis not present

## 2018-12-06 DIAGNOSIS — I10 Essential (primary) hypertension: Secondary | ICD-10-CM | POA: Diagnosis not present

## 2018-12-06 DIAGNOSIS — E7800 Pure hypercholesterolemia, unspecified: Secondary | ICD-10-CM | POA: Insufficient documentation

## 2018-12-06 DIAGNOSIS — Z794 Long term (current) use of insulin: Secondary | ICD-10-CM | POA: Insufficient documentation

## 2018-12-06 DIAGNOSIS — C3491 Malignant neoplasm of unspecified part of right bronchus or lung: Secondary | ICD-10-CM | POA: Diagnosis not present

## 2018-12-06 DIAGNOSIS — Z923 Personal history of irradiation: Secondary | ICD-10-CM | POA: Insufficient documentation

## 2018-12-06 DIAGNOSIS — F17210 Nicotine dependence, cigarettes, uncomplicated: Secondary | ICD-10-CM | POA: Insufficient documentation

## 2018-12-06 DIAGNOSIS — D6481 Anemia due to antineoplastic chemotherapy: Secondary | ICD-10-CM | POA: Insufficient documentation

## 2018-12-06 DIAGNOSIS — Z452 Encounter for adjustment and management of vascular access device: Secondary | ICD-10-CM

## 2018-12-06 DIAGNOSIS — J910 Malignant pleural effusion: Secondary | ICD-10-CM

## 2018-12-06 DIAGNOSIS — Z95828 Presence of other vascular implants and grafts: Secondary | ICD-10-CM

## 2018-12-06 LAB — CMP (CANCER CENTER ONLY)
ALT: 9 U/L (ref 0–44)
AST: 13 U/L — ABNORMAL LOW (ref 15–41)
Albumin: 3 g/dL — ABNORMAL LOW (ref 3.5–5.0)
Alkaline Phosphatase: 94 U/L (ref 38–126)
Anion gap: 11 (ref 5–15)
BUN: 10 mg/dL (ref 6–20)
CO2: 22 mmol/L (ref 22–32)
Calcium: 8.4 mg/dL — ABNORMAL LOW (ref 8.9–10.3)
Chloride: 106 mmol/L (ref 98–111)
Creatinine: 0.91 mg/dL (ref 0.44–1.00)
GFR, Est AFR Am: >60 mL/min (ref >60)
GFR, Estimated: >60 mL/min (ref >60)
Glucose, Bld: 207 mg/dL — ABNORMAL HIGH (ref 70–99)
Potassium: 4.3 mmol/L (ref 3.5–5.1)
Sodium: 139 mmol/L (ref 135–145)
Total Bilirubin: 0.3 mg/dL (ref 0.3–1.2)
Total Protein: 7.8 g/dL (ref 6.5–8.1)

## 2018-12-06 LAB — CBC WITH DIFFERENTIAL/PLATELET
Abs Immature Granulocytes: 0.03 10*3/uL (ref 0.00–0.07)
Basophils Absolute: 0 10*3/uL (ref 0.0–0.1)
Basophils Relative: 0 %
Eosinophils Absolute: 0.1 10*3/uL (ref 0.0–0.5)
Eosinophils Relative: 1 %
HCT: 29.6 % — ABNORMAL LOW (ref 36.0–46.0)
Hemoglobin: 9.5 g/dL — ABNORMAL LOW (ref 12.0–15.0)
Immature Granulocytes: 0 %
Lymphocytes Relative: 10 %
Lymphs Abs: 1 10*3/uL (ref 0.7–4.0)
MCH: 30.9 pg (ref 26.0–34.0)
MCHC: 32.1 g/dL (ref 30.0–36.0)
MCV: 96.4 fL (ref 80.0–100.0)
Monocytes Absolute: 0.6 10*3/uL (ref 0.1–1.0)
Monocytes Relative: 6 %
Neutro Abs: 8 10*3/uL — ABNORMAL HIGH (ref 1.7–7.7)
Neutrophils Relative %: 83 %
Platelets: 274 10*3/uL (ref 150–400)
RBC: 3.07 MIL/uL — ABNORMAL LOW (ref 3.87–5.11)
RDW: 19.2 % — ABNORMAL HIGH (ref 11.5–15.5)
WBC: 9.8 10*3/uL (ref 4.0–10.5)
nRBC: 0 % (ref 0.0–0.2)

## 2018-12-06 LAB — MAGNESIUM: Magnesium: 1.2 mg/dL — CL (ref 1.7–2.4)

## 2018-12-06 MED ORDER — DIPHENHYDRAMINE HCL 50 MG/ML IJ SOLN
25.0000 mg | Freq: Once | INTRAMUSCULAR | Status: AC
  Administered 2018-12-06: 25 mg via INTRAVENOUS

## 2018-12-06 MED ORDER — ALTEPLASE 2 MG IJ SOLR
INTRAMUSCULAR | Status: AC
  Filled 2018-12-06: qty 2

## 2018-12-06 MED ORDER — SODIUM CHLORIDE 0.9 % IV SOLN
1800.0000 mg | Freq: Once | INTRAVENOUS | Status: AC
  Administered 2018-12-06: 1800 mg via INTRAVENOUS
  Filled 2018-12-06: qty 64

## 2018-12-06 MED ORDER — FAMOTIDINE IN NACL 20-0.9 MG/50ML-% IV SOLN
20.0000 mg | Freq: Once | INTRAVENOUS | Status: AC
  Administered 2018-12-06: 20 mg via INTRAVENOUS

## 2018-12-06 MED ORDER — HEPARIN SOD (PORK) LOCK FLUSH 100 UNIT/ML IV SOLN
500.0000 [IU] | Freq: Once | INTRAVENOUS | Status: AC | PRN
  Administered 2018-12-06: 500 [IU]
  Filled 2018-12-06: qty 5

## 2018-12-06 MED ORDER — SODIUM CHLORIDE 0.9% FLUSH
10.0000 mL | INTRAVENOUS | Status: DC | PRN
  Administered 2018-12-06: 10 mL via INTRAVENOUS
  Filled 2018-12-06: qty 10

## 2018-12-06 MED ORDER — SODIUM CHLORIDE 0.9 % IV SOLN
Freq: Once | INTRAVENOUS | Status: AC
  Administered 2018-12-06: 13:00:00 via INTRAVENOUS
  Filled 2018-12-06: qty 500

## 2018-12-06 MED ORDER — ALTEPLASE 2 MG IJ SOLR
2.0000 mg | Freq: Once | INTRAMUSCULAR | Status: AC | PRN
  Administered 2018-12-06: 2 mg
  Filled 2018-12-06: qty 2

## 2018-12-06 MED ORDER — SODIUM CHLORIDE 0.9 % IV SOLN: 4.0000 g | Freq: Once | INTRAVENOUS | Status: DC

## 2018-12-06 MED ORDER — PROCHLORPERAZINE MALEATE 10 MG PO TABS
ORAL_TABLET | ORAL | Status: AC
  Filled 2018-12-06: qty 1

## 2018-12-06 MED ORDER — PROCHLORPERAZINE MALEATE 10 MG PO TABS
10.0000 mg | ORAL_TABLET | Freq: Once | ORAL | Status: AC
  Administered 2018-12-06: 10 mg via ORAL

## 2018-12-06 MED ORDER — SODIUM CHLORIDE 0.9 % IV SOLN
Freq: Once | INTRAVENOUS | Status: AC
  Administered 2018-12-06: 10:00:00 via INTRAVENOUS
  Filled 2018-12-06: qty 250

## 2018-12-06 MED ORDER — DIPHENHYDRAMINE HCL 50 MG/ML IJ SOLN
INTRAMUSCULAR | Status: AC
  Filled 2018-12-06: qty 1

## 2018-12-06 MED ORDER — CYANOCOBALAMIN 1000 MCG/ML IJ SOLN
1000.0000 ug | Freq: Once | INTRAMUSCULAR | Status: AC
  Administered 2018-12-06: 11:00:00 1000 ug via SUBCUTANEOUS

## 2018-12-06 MED ORDER — SODIUM CHLORIDE 0.9% FLUSH
10.0000 mL | INTRAVENOUS | Status: DC | PRN
  Administered 2018-12-06: 10 mL
  Filled 2018-12-06: qty 10

## 2018-12-06 MED ORDER — SODIUM CHLORIDE 0.9 % IV SOLN
500.0000 mg/m2 | Freq: Once | INTRAVENOUS | Status: AC
  Administered 2018-12-06: 1200 mg via INTRAVENOUS
  Filled 2018-12-06: qty 40

## 2018-12-06 MED ORDER — FAMOTIDINE IN NACL 20-0.9 MG/50ML-% IV SOLN
INTRAVENOUS | Status: AC
  Filled 2018-12-06: qty 50

## 2018-12-06 MED ORDER — SODIUM CHLORIDE 0.9 % IV SOLN: Freq: Once | INTRAVENOUS | Status: DC

## 2018-12-06 MED ORDER — CYANOCOBALAMIN 1000 MCG/ML IJ SOLN
INTRAMUSCULAR | Status: AC
  Filled 2018-12-06: qty 1

## 2018-12-06 NOTE — Patient Instructions (Addendum)
[place] Discharge Instructions for Patients Receiving Chemotherapy  Today you received the following chemotherapy agents alimta/avastin   To help prevent nausea and vomiting after your treatment, we encourage you to take your nausea medication as directed.   If you develop nausea and vomiting that is not controlled by your nausea medication, call the clinic.   BELOW ARE SYMPTOMS THAT SHOULD BE REPORTED IMMEDIATELY:  *FEVER GREATER THAN 100.5 F  *CHILLS WITH OR WITHOUT FEVER  NAUSEA AND VOMITING THAT IS NOT CONTROLLED WITH YOUR NAUSEA MEDICATION  *UNUSUAL SHORTNESS OF BREATH  *UNUSUAL BRUISING OR BLEEDING  TENDERNESS IN MOUTH AND THROAT WITH OR WITHOUT PRESENCE OF ULCERS  *URINARY PROBLEMS  *BOWEL PROBLEMS  UNUSUAL RASH Items with * indicate a potential emergency and should be followed up as soon as possible.  Feel free to call the clinic you have any questions or concerns. The clinic phone number is [phone].   Hypomagnesemia Hypomagnesemia is a condition in which the level of magnesium in the blood is low. Magnesium is a mineral that is found in many foods. It is used in many different processes in the body. Hypomagnesemia can affect every organ in the body. In severe cases, it can cause life-threatening problems. What are the causes? This condition may be caused by:  Not getting enough magnesium in your diet.  Malnutrition.  Problems with absorbing magnesium from the intestines.  Dehydration.  Alcohol abuse.  Vomiting.  Severe or chronic diarrhea.  Some medicines, including medicines that make you urinate more (diuretics).  Certain diseases, such as kidney disease, diabetes, celiac disease, and overactive thyroid. What are the signs or symptoms? Symptoms of this condition include:  Loss of appetite.  Nausea and vomiting.  Involuntary shaking or trembling of a body part (tremor).  Muscle weakness.  Tingling in the arms and  legs.  Sudden tightening of muscles (muscle spasms).  Confusion.  Psychiatric issues, such as depression, irritability, or psychosis.  A feeling of fluttering of the heart.  Seizures. These symptoms are more severe if magnesium levels drop suddenly. How is this diagnosed? This condition may be diagnosed based on:  Your symptoms and medical history.  A physical exam.  Blood and urine tests. How is this treated? Treatment depends on the cause and the severity of the condition. It may be treated with:  A magnesium supplement. This can be taken in pill form. If the condition is severe, magnesium is usually given through an IV.  Changes to your diet. You may be directed to eat foods that have a lot of magnesium, such as green leafy vegetables, peas, beans, and nuts.  Stopping any intake of alcohol. Follow these instructions at home:      Make sure that your diet includes foods with magnesium. Foods that have a lot of magnesium in them include: ? Green leafy vegetables, such as spinach and broccoli. ? Beans and peas. ? Nuts and seeds, such as almonds and sunflower seeds. ? Whole grains, such as whole grain bread and fortified cereals.  Take magnesium supplements if your health care provider tells you to do that. Take them as directed.  Take over-the-counter and prescription medicines only as told by your health care provider.  Have your magnesium levels monitored as told by your health care provider.  When you are active, drink fluids that contain electrolytes.  Avoid drinking alcohol.  Keep all follow-up visits as told by your health care provider. This is important. Contact a health care provider  if:  You get worse instead of better.  Your symptoms return. Get help right away if you:  Develop severe muscle weakness.  Have trouble breathing.  Feel that your heart is racing. Summary  Hypomagnesemia is a condition in which the level of magnesium in the blood  is low.  Hypomagnesemia can affect every organ in the body.  Treatment may include eating more foods that contain magnesium, taking magnesium supplements, and not drinking alcohol.  Have your magnesium levels monitored as told by your health care provider. This information is not intended to replace advice given to you by your health care provider. Make sure you discuss any questions you have with your health care provider. Document Released: 12/16/2004 Document Revised: 03/04/2017 Document Reviewed: 02/21/2017 Elsevier Patient Education  2020 [employer].

## 2018-12-06 NOTE — Progress Notes (Signed)
Unable to get blood return from port. Cathflo administered by [employer]. Patient sent back to lab to get blood drawn from arm.

## 2018-12-07 ENCOUNTER — Telehealth: Payer: Self-pay | Admitting: Hematology

## 2018-12-07 NOTE — Telephone Encounter (Signed)
No los per 9/2.

## 2018-12-10 ENCOUNTER — Encounter (HOSPITAL_COMMUNITY): Payer: Self-pay

## 2018-12-10 ENCOUNTER — Emergency Department (HOSPITAL_COMMUNITY): Payer: Medicare Other

## 2018-12-10 ENCOUNTER — Inpatient Hospital Stay (HOSPITAL_COMMUNITY)
Admission: EM | Admit: 2018-12-10 | Discharge: 2018-12-12 | DRG: 180 | Disposition: A | Discharge status: Home Health | Payer: Medicare Other | Attending: Family Medicine | Admitting: Family Medicine

## 2018-12-10 ENCOUNTER — Other Ambulatory Visit: Payer: Self-pay

## 2018-12-10 DIAGNOSIS — R0689 Other abnormalities of breathing: Secondary | ICD-10-CM | POA: Diagnosis not present

## 2018-12-10 DIAGNOSIS — E039 Hypothyroidism, unspecified: Secondary | ICD-10-CM | POA: Diagnosis present

## 2018-12-10 DIAGNOSIS — C3491 Malignant neoplasm of unspecified part of right bronchus or lung: Principal | ICD-10-CM

## 2018-12-10 DIAGNOSIS — C773 Secondary and unspecified malignant neoplasm of axilla and upper limb lymph nodes: Secondary | ICD-10-CM | POA: Diagnosis present

## 2018-12-10 DIAGNOSIS — I110 Hypertensive heart disease with heart failure: Secondary | ICD-10-CM | POA: Diagnosis present

## 2018-12-10 DIAGNOSIS — Z85118 Personal history of other malignant neoplasm of bronchus and lung: Secondary | ICD-10-CM

## 2018-12-10 DIAGNOSIS — T451X5A Adverse effect of antineoplastic and immunosuppressive drugs, initial encounter: Secondary | ICD-10-CM | POA: Diagnosis present

## 2018-12-10 DIAGNOSIS — Z95828 Presence of other vascular implants and grafts: Secondary | ICD-10-CM

## 2018-12-10 DIAGNOSIS — I5032 Chronic diastolic (congestive) heart failure: Secondary | ICD-10-CM | POA: Diagnosis present

## 2018-12-10 DIAGNOSIS — C792 Secondary malignant neoplasm of skin: Secondary | ICD-10-CM | POA: Diagnosis present

## 2018-12-10 DIAGNOSIS — R000 Tachycardia, unspecified: Secondary | ICD-10-CM | POA: Diagnosis not present

## 2018-12-10 DIAGNOSIS — J9601 Acute respiratory failure with hypoxia: Secondary | ICD-10-CM | POA: Diagnosis not present

## 2018-12-10 DIAGNOSIS — F329 Major depressive disorder, single episode, unspecified: Secondary | ICD-10-CM | POA: Diagnosis present

## 2018-12-10 DIAGNOSIS — I428 Other cardiomyopathies: Secondary | ICD-10-CM | POA: Diagnosis not present

## 2018-12-10 DIAGNOSIS — Z79890 Hormone replacement therapy (postmenopausal): Secondary | ICD-10-CM

## 2018-12-10 DIAGNOSIS — E119 Type 2 diabetes mellitus without complications: Secondary | ICD-10-CM

## 2018-12-10 DIAGNOSIS — Z8701 Personal history of pneumonia (recurrent): Secondary | ICD-10-CM

## 2018-12-10 DIAGNOSIS — Z794 Long term (current) use of insulin: Secondary | ICD-10-CM

## 2018-12-10 DIAGNOSIS — J90 Pleural effusion, not elsewhere classified: Secondary | ICD-10-CM

## 2018-12-10 DIAGNOSIS — E7800 Pure hypercholesterolemia, unspecified: Secondary | ICD-10-CM | POA: Diagnosis present

## 2018-12-10 DIAGNOSIS — G622 Polyneuropathy due to other toxic agents: Secondary | ICD-10-CM | POA: Diagnosis present

## 2018-12-10 DIAGNOSIS — J910 Malignant pleural effusion: Secondary | ICD-10-CM | POA: Diagnosis present

## 2018-12-10 DIAGNOSIS — I351 Nonrheumatic aortic (valve) insufficiency: Secondary | ICD-10-CM | POA: Diagnosis present

## 2018-12-10 DIAGNOSIS — I10 Essential (primary) hypertension: Secondary | ICD-10-CM | POA: Diagnosis not present

## 2018-12-10 DIAGNOSIS — Z20828 Contact with and (suspected) exposure to other viral communicable diseases: Secondary | ICD-10-CM | POA: Diagnosis present

## 2018-12-10 DIAGNOSIS — E1140 Type 2 diabetes mellitus with diabetic neuropathy, unspecified: Secondary | ICD-10-CM | POA: Diagnosis present

## 2018-12-10 DIAGNOSIS — Z7982 Long term (current) use of aspirin: Secondary | ICD-10-CM

## 2018-12-10 DIAGNOSIS — D630 Anemia in neoplastic disease: Secondary | ICD-10-CM | POA: Diagnosis present

## 2018-12-10 DIAGNOSIS — R0602 Shortness of breath: Secondary | ICD-10-CM | POA: Diagnosis not present

## 2018-12-10 DIAGNOSIS — Z8249 Family history of ischemic heart disease and other diseases of the circulatory system: Secondary | ICD-10-CM

## 2018-12-10 DIAGNOSIS — J80 Acute respiratory distress syndrome: Secondary | ICD-10-CM | POA: Diagnosis not present

## 2018-12-10 DIAGNOSIS — Z79899 Other long term (current) drug therapy: Secondary | ICD-10-CM

## 2018-12-10 LAB — BASIC METABOLIC PANEL
Anion gap: 9 (ref 5–15)
BUN: 10 mg/dL (ref 6–20)
CO2: 22 mmol/L (ref 22–32)
Calcium: 8.4 mg/dL — ABNORMAL LOW (ref 8.9–10.3)
Chloride: 106 mmol/L (ref 98–111)
Creatinine, Ser: 0.84 mg/dL (ref 0.44–1.00)
GFR calc Af Amer: >60 mL/min (ref >60)
GFR calc non Af Amer: >60 mL/min (ref >60)
Glucose, Bld: 174 mg/dL — ABNORMAL HIGH (ref 70–99)
Potassium: 3.8 mmol/L (ref 3.5–5.1)
Sodium: 137 mmol/L (ref 135–145)

## 2018-12-10 LAB — CBC
HCT: 25.8 % — ABNORMAL LOW (ref 36.0–46.0)
Hemoglobin: 8.3 g/dL — ABNORMAL LOW (ref 12.0–15.0)
MCH: 31.7 pg (ref 26.0–34.0)
MCHC: 32.2 g/dL (ref 30.0–36.0)
MCV: 98.5 fL (ref 80.0–100.0)
Platelets: 262 10*3/uL (ref 150–400)
RBC: 2.62 MIL/uL — ABNORMAL LOW (ref 3.87–5.11)
RDW: 18.9 % — ABNORMAL HIGH (ref 11.5–15.5)
WBC: 8.5 10*3/uL (ref 4.0–10.5)
nRBC: 0 % (ref 0.0–0.2)

## 2018-12-10 LAB — LACTIC ACID, PLASMA: Lactic Acid, Venous: 1.6 mmol/L (ref 0.5–1.9)

## 2018-12-10 LAB — TROPONIN I (HIGH SENSITIVITY): Troponin I (High Sensitivity): 17 ng/L (ref <18)

## 2018-12-10 LAB — BRAIN NATRIURETIC PEPTIDE: B Natriuretic Peptide: 40.5 pg/mL (ref 0.0–100.0)

## 2018-12-10 MED ORDER — SODIUM CHLORIDE (PF) 0.9 % IJ SOLN
INTRAMUSCULAR | Status: AC
  Administered 2018-12-10: 23:00:00
  Filled 2018-12-10: qty 50

## 2018-12-10 MED ORDER — SODIUM CHLORIDE 0.9 % IV BOLUS (SEPSIS)
500.0000 mL | Freq: Once | INTRAVENOUS | Status: AC
  Administered 2018-12-10: 21:00:00 500 mL via INTRAVENOUS

## 2018-12-10 MED ORDER — SODIUM CHLORIDE 0.9 % IV SOLN
1000.0000 mL | INTRAVENOUS | Status: DC
  Administered 2018-12-10 – 2018-12-11 (×2): 1000 mL via INTRAVENOUS

## 2018-12-10 MED ORDER — IOHEXOL 350 MG/ML SOLN
100.0000 mL | Freq: Once | INTRAVENOUS | Status: AC | PRN
  Administered 2018-12-10: 23:00:00 100 mL via INTRAVENOUS

## 2018-12-10 MED ORDER — SODIUM CHLORIDE 0.9% FLUSH
10.0000 mL | INTRAVENOUS | Status: DC | PRN
  Administered 2018-12-12: 14:00:00 10 mL
  Filled 2018-12-10: qty 40

## 2018-12-10 NOTE — ED Notes (Signed)
Patients port accessed but unable to get blood return. Pt refusing to be stuck for an IV/bloodwork until IV team comes down and attempts to obtain blood from port.

## 2018-12-10 NOTE — ED Triage Notes (Signed)
BIB EMS PT has hx of stage 4 lung cancer. SHOB coughing x 2 hours.  Normally not on oxygen. EMS reports intial 1 word sentences RR 40 98% on RA. Now 3L 99% Full sentences. Chemo this past week.   114/70 130 99% 3L RR 24

## 2018-12-10 NOTE — ED Provider Notes (Signed)
Assumed care from Dr. Tomi Bamberger at shift change.  See prior note for full H&P.  Briefly, 53 year old female with history of metastatic lung cancer here with SOB.  Continued chemo, last infusion 12/06/18.  Pleural effusion in August requiring thoracentesis.  No fevers, no cough.  Has been tachy here, received fluid bolus.  Plan:  Labs, CTA pending.  Results for orders placed or performed during the hospital encounter of 10/03/14  Basic metabolic panel  Result Value Ref Range   Sodium 137 135 - 145 mmol/L   Potassium 3.8 3.5 - 5.1 mmol/L   Chloride 106 98 - 111 mmol/L   CO2 22 22 - 32 mmol/L   Glucose, Bld 174 (H) 70 - 99 mg/dL   BUN 10 6 - 20 mg/dL   Creatinine, Ser 0.84 0.44 - 1.00 mg/dL   Calcium 8.4 (L) 8.9 - 10.3 mg/dL   GFR calc non Af Amer >60 >60 mL/min   GFR calc Af Amer >60 >60 mL/min   Anion gap 9 5 - 15  CBC  Result Value Ref Range   WBC 8.5 4.0 - 10.5 K/uL   RBC 2.62 (L) 3.87 - 5.11 MIL/uL   Hemoglobin 8.3 (L) 12.0 - 15.0 g/dL   HCT 25.8 (L) 36.0 - 46.0 %   MCV 98.5 80.0 - 100.0 fL   MCH 31.7 26.0 - 34.0 pg   MCHC 32.2 30.0 - 36.0 g/dL   RDW 18.9 (H) 11.5 - 15.5 %   Platelets 262 150 - 400 K/uL   nRBC 0.0 0.0 - 0.2 %  Brain natriuretic peptide  Result Value Ref Range   B Natriuretic Peptide 40.5 0.0 - 100.0 pg/mL  Lactic acid, plasma  Result Value Ref Range   Lactic Acid, Venous 1.6 0.5 - 1.9 mmol/L  Troponin I (High Sensitivity)  Result Value Ref Range   Troponin I (High Sensitivity) 17 <18 ng/L   Dg Chest 1 View  Result Date: 11/16/2018 CLINICAL DATA:  Post right thoracentesis. History of stage IV lung cancer. EXAM: CHEST  1 VIEW COMPARISON:  09/15/2018 and chest CT 11/16/2018. FINDINGS: Volume loss in the right hemithorax. Hazy densities at the right lung base may represent atelectasis. Persistent densities in the right hilar region compatible with areas of consolidation or volume loss. Right hilar densities are unchanged. Negative for right pneumothorax. Lucency  lateral to the right hilar soft tissue is similar to the prior chest radiograph. Left lung is clear. Heart and mediastinum are stable. IMPRESSION: 1. Negative for pneumothorax following the right thoracentesis. 2. Persistent volume loss in the right hemithorax with stable densities in the right hilar region. Electronically Signed   By: Markus Daft M.D.   On: 11/16/2018 13:32   Ct Chest W Contrast  Result Date: 11/29/2018 CLINICAL DATA:  53 year old female with history of stage IV lung cancer undergoing ongoing chemotherapy. Mid chest pain for the past 3 weeks. Cough and shortness of breath. Follow-up study. EXAM: CT CHEST, ABDOMEN, AND PELVIS WITH CONTRAST TECHNIQUE: Multidetector CT imaging of the chest, abdomen and pelvis was performed following the standard protocol during bolus administration of intravenous contrast. CONTRAST:  140mL OMNIPAQUE IOHEXOL 300 MG/ML  SOLN COMPARISON:  Chest CT 11/16/2018. CT the abdomen and pelvis 05/30/2018. Multiple other priors. FINDINGS: CT CHEST FINDINGS Cardiovascular: Heart size is normal. There is no significant pericardial fluid, thickening or pericardial calcification. There is aortic atherosclerosis, as well as atherosclerosis of the great vessels of the mediastinum and the coronary arteries, including calcified atherosclerotic plaque in  the left anterior descending, left circumflex and right coronary arteries. Mediastinum/Nodes: Low right paratracheal lymph node measuring 1.7 cm in short axis. Esophagus is unremarkable in appearance. Enlarged right axillary lymph node measuring 2.7 cm in short axis, similar to the prior study. Other smaller right axillary lymph nodes are also noted. Lungs/Pleura: Chronic mass-like areas of architectural distortion in the perihilar aspect of the right upper lobe and superior segment of the right lower lobe, similar to the prior study, most compatible with chronic postradiation mass-like fibrosis. Moderate right pleural effusion lying  dependently. No acute consolidative airspace disease. No pleural effusions. No suspicious appearing pulmonary nodules or masses are noted. Musculoskeletal: There are no aggressive appearing lytic or blastic lesions noted in the visualized portions of the skeleton. CT ABDOMEN PELVIS FINDINGS Hepatobiliary: Ill-defined low-attenuation area in segment 4B of the liver adjacent to the falciform ligament, incompletely characterize, but similar to focal low-attenuation seen on prior study 05/30/2018, most compatible with focal fatty infiltration. No other suspicious cystic or solid hepatic lesions. No intra or extrahepatic biliary ductal dilatation. Gallbladder is contracted around several noncalcified gallstones. No surrounding pericholecystic fluid or inflammatory changes to suggest an acute cholecystitis at this time. Pancreas: No pancreatic mass. No pancreatic ductal dilatation. No pancreatic or peripancreatic fluid collections or inflammatory changes. Spleen: Unremarkable. Adrenals/Urinary Tract: Bilateral kidneys and adrenal glands are normal in appearance. No hydroureteronephrosis. Urinary bladder is normal in appearance. Stomach/Bowel: Normal appearance of the stomach. No pathologic dilatation of small bowel or colon. The appendix is not confidently identified and may be surgically absent. Regardless, there are no inflammatory changes noted adjacent to the cecum to suggest the presence of an acute appendicitis at this time. Vascular/Lymphatic: Aortic atherosclerosis, without evidence of aneurysm or dissection in the abdominal or pelvic vasculature. Multiple prominent borderline enlarged and mildly enlarged retroperitoneal and pelvic lymph nodes. The largest retroperitoneal lymph nodes measure up to 1 cm in short axis in the aortocaval nodal station (axial image 75 of series 2). The largest pelvic lymph nodes are in the right internal iliac nodal chain measuring up to 1.5 cm in short axis on axial image 103 of  series 2 and 109 of series 2. 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 Ovaries are unremarkable in appearance. Other: No significant volume of ascites.  No pneumoperitoneum. Musculoskeletal: There are no aggressive appearing lytic or blastic lesions noted in the visualized portions of the skeleton. IMPRESSION: 1. Persistent low right paratracheal lymphadenopathy, similar to the prior study. This may reflect residual disease. Stable area of presumed postradiation mass-like fibrosis in the perihilar aspect of the right upper lung. 2. Moderate right pleural effusion is similar to the prior examination. 3. Lymphadenopathy also noted in the right axillary region, right hemipelvis and retroperitoneum, as above, potentially reflective of metastatic disease. 4. Focal low-attenuation region in segment 4B of the liver, similar to prior noncontrast CT examination of the abdomen, likely to represent focal fatty infiltration. 5. Cholelithiasis without evidence of acute cholecystitis at this time. 6. Aortic atherosclerosis, in addition to 3 vessel coronary artery disease. Please note that although the presence of coronary artery calcium documents the presence of coronary artery disease, the severity of this disease and any potential stenosis cannot be assessed on this non-gated CT examination. Assessment for potential risk factor modification, dietary therapy or pharmacologic therapy may be warranted, if clinically indicated. 7. Additional incidental findings, as above. Electronically Signed   By: Vinnie Langton M.D.   On: 11/29/2018 14:09  Ct Angio Chest Pe W And/or Wo Contrast  Result Date: 12/10/2018 CLINICAL DATA:  Shortness of breath and coughing EXAM: CT ANGIOGRAPHY CHEST WITH CONTRAST TECHNIQUE: Multidetector CT imaging of the chest was performed using the standard protocol during bolus administration of intravenous contrast. Multiplanar CT image  reconstructions and MIPs were obtained to evaluate the vascular anatomy. CONTRAST:  156mL OMNIPAQUE IOHEXOL 350 MG/ML SOLN COMPARISON:  11/29/2018 FINDINGS: The study is somewhat limited due to patient motion. Cardiovascular: There is a optimal opacification of the pulmonary arteries. There is no central,segmental, or subsegmental filling defects within the pulmonary arteries. There is mild cardiomegaly. No evidence of right ventricular heart strain. There is normal three-vessel brachiocephalic anatomy without proximal stenosis. The thoracic aorta is normal in appearance. Mediastinum/Nodes: There is soft tissue thickening seen at along the right paratracheal stripe as on the prior exam. There is marked right axillary adenopathy with areas of central necrosis and surrounding fat stranding changes as on the prior exam. Lungs/Pleura: There is interval large min of a moderate to large right pleural effusion. Postradiation fibrotic changes are seen in the medial right upper lung. There is hazy bibasilar atelectasis. Upper Abdomen: No acute abnormalities present in the visualized portions of the upper abdomen. Musculoskeletal: No chest wall abnormality. No acute or significant osseous findings. Review of the MIP images confirms the above findings. IMPRESSION: 1. No pulmonary embolism. 2. Interval enlargement of moderate to large right pleural effusion 3. Post radiation fibrotic changes seen in the medial right upper lung. 4. Right axillary adenopathy with surrounding inflammatory changes and central necrosis. Electronically Signed   By: Prudencio Pair M.D.   On: 12/10/2018 23:16   Ct Angio Chest Pe W And/or Wo Contrast  Result Date: 11/16/2018 CLINICAL DATA:  Stage IV lung carcinoma. Chest pain and shortness of breath EXAM: CT ANGIOGRAPHY CHEST WITH CONTRAST TECHNIQUE: Multidetector CT imaging of the chest was performed using the standard protocol during bolus administration of intravenous contrast. Multiplanar CT image  reconstructions and MIPs were obtained to evaluate the vascular anatomy. CONTRAST:  141mL OMNIPAQUE IOHEXOL 350 MG/ML SOLN COMPARISON:  September 14, 2018 FINDINGS: Cardiovascular: There is no demonstrable pulmonary embolus. There is no thoracic aortic aneurysm or dissection. Visualized great vessels appear unremarkable. There is no pericardial effusion or pericardial thickening. Port-A-Cath tip is in the superior vena cava. Mediastinum/Nodes: Visualized thyroid appears unremarkable. There remains axillary adenopathy on the right. The largest lymph node in the right axillary region measures 3.4 x 2.5 cm, similar to prior study. There is left supraclavicular lymph node enlargement medially with focal lymph node in this area measuring 2.3 x 1.9 cm, essentially stable. Right paratracheal adenopathy is also present with index lymph node in this area measuring 2.0 x 1.7 cm, essentially stable. No new adenopathy evident. No esophageal lesions appreciable. Lungs/Pleura: There is a sizable free-flowing pleural effusion on the right. The right perihilar region post radiation therapy change is stable. There is atelectatic change in this area as well. The contour of this area is stable compared to most recent CT. No well-defined mass seen in this area. Left lung is clear. Upper Abdomen: Visualized upper abdominal structures appear unremarkable. Musculoskeletal: No blastic or lytic bone lesions. Diffuse skin thickening in the right breast region is stable. Review of the MIP images confirms the above findings. IMPRESSION: 1. No demonstrable pulmonary embolus. No thoracic aortic aneurysm or dissection. 2. Sizable free-flowing pleural effusion on the right. Post radiation therapy change in the right perihilar region is stable. There is  atelectasis in this area but no well-defined mass. Subtle neoplasm in this area is difficult to exclude, although the appearance is stable compared to most recent CT. 3. Stable adenopathy in the right  axillary, left supraclavicular, and right paratracheal regions. 4.  Persistent skin thickening right breast. Electronically Signed   By: Lowella Grip III M.D.   On: 11/16/2018 08:18   Nm Bone Scan Whole Body  Result Date: 11/29/2018 CLINICAL DATA:  Non-small cell lung cancer EXAM: NUCLEAR MEDICINE WHOLE BODY BONE SCAN TECHNIQUE: Whole body anterior and posterior images were obtained approximately 3 hours after intravenous injection of radiopharmaceutical. RADIOPHARMACEUTICALS:  21 mCi Technetium-12m MDP IV COMPARISON:  Correlation: PET-CT 08/03/2017, CT chest abdomen pelvis 11/29/2018 FINDINGS: Decreased tracer localization is identified within upper thoracic spine and in upper LEFT ribs. Retained tracer within RIGHT jugular Port-A-Cath reservoir and tubing. Questionable foci of increased tracer accumulation at the calvarium especially posterior RIGHT temporal region and LEFT parietal region, nonspecific but cannot exclude osseous metastases. Nonspecific uptake of tracer at the distal sternum/xiphoid process, no abnormality seen on CT. No additional worrisome sites of osseous tracer accumulation are identified. Asymmetric tracer retention within the RIGHT renal collecting system versus LEFT though no hydronephrosis is identified on CT. Diffuse uptake of tracer in the RIGHT hemithorax corresponding to pleural effusion on CT. Remaining urinary tract and soft tissue distribution of tracer is unremarkable. IMPRESSION: Questionable foci of increased tracer accumulation in the calvaria and at the distal sternum/xiphoid process, uncertain etiology, cannot exclude osseous metastatic disease No other worrisome sites of osseous tracer accumulation are identified. Decreased tracer uptake at the thoracic spine and at upper LEFT ribs, nonspecific, can be seen at radiation therapy ports recommend correlation with patient history. RIGHT pleural effusion. Electronically Signed   By: Lavonia Dana M.D.   On: 11/29/2018 16:33    Ct Abdomen Pelvis W Contrast  Result Date: 11/29/2018 CLINICAL DATA:  53 year old female with history of stage IV lung cancer undergoing ongoing chemotherapy. Mid chest pain for the past 3 weeks. Cough and shortness of breath. Follow-up study. EXAM: CT CHEST, ABDOMEN, AND PELVIS WITH CONTRAST TECHNIQUE: Multidetector CT imaging of the chest, abdomen and pelvis was performed following the standard protocol during bolus administration of intravenous contrast. CONTRAST:  140mL OMNIPAQUE IOHEXOL 300 MG/ML  SOLN COMPARISON:  Chest CT 11/16/2018. CT the abdomen and pelvis 05/30/2018. Multiple other priors. FINDINGS: CT CHEST FINDINGS Cardiovascular: Heart size is normal. There is no significant pericardial fluid, thickening or pericardial calcification. There is aortic atherosclerosis, as well as atherosclerosis of the great vessels of the mediastinum and the coronary arteries, including calcified atherosclerotic plaque in the left anterior descending, left circumflex and right coronary arteries. Mediastinum/Nodes: Low right paratracheal lymph node measuring 1.7 cm in short axis. Esophagus is unremarkable in appearance. Enlarged right axillary lymph node measuring 2.7 cm in short axis, similar to the prior study. Other smaller right axillary lymph nodes are also noted. Lungs/Pleura: Chronic mass-like areas of architectural distortion in the perihilar aspect of the right upper lobe and superior segment of the right lower lobe, similar to the prior study, most compatible with chronic postradiation mass-like fibrosis. Moderate right pleural effusion lying dependently. No acute consolidative airspace disease. No pleural effusions. No suspicious appearing pulmonary nodules or masses are noted. Musculoskeletal: There are no aggressive appearing lytic or blastic lesions noted in the visualized portions of the skeleton. CT ABDOMEN PELVIS FINDINGS Hepatobiliary: Ill-defined low-attenuation area in segment 4B of the liver  adjacent to the falciform ligament, incompletely  characterize, but similar to focal low-attenuation seen on prior study 05/30/2018, most compatible with focal fatty infiltration. No other suspicious cystic or solid hepatic lesions. No intra or extrahepatic biliary ductal dilatation. Gallbladder is contracted around several noncalcified gallstones. No surrounding pericholecystic fluid or inflammatory changes to suggest an acute cholecystitis at this time. Pancreas: No pancreatic mass. No pancreatic ductal dilatation. No pancreatic or peripancreatic fluid collections or inflammatory changes. Spleen: Unremarkable. Adrenals/Urinary Tract: Bilateral kidneys and adrenal glands are normal in appearance. No hydroureteronephrosis. Urinary bladder is normal in appearance. Stomach/Bowel: Normal appearance of the stomach. No pathologic dilatation of small bowel or colon. The appendix is not confidently identified and may be surgically absent. Regardless, there are no inflammatory changes noted adjacent to the cecum to suggest the presence of an acute appendicitis at this time. Vascular/Lymphatic: Aortic atherosclerosis, without evidence of aneurysm or dissection in the abdominal or pelvic vasculature. Multiple prominent borderline enlarged and mildly enlarged retroperitoneal and pelvic lymph nodes. The largest retroperitoneal lymph nodes measure up to 1 cm in short axis in the aortocaval nodal station (axial image 75 of series 2). The largest pelvic lymph nodes are in the right internal iliac nodal chain measuring up to 1.5 cm in short axis on axial image 103 of series 2 and 109 of series 2. 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 Ovaries are unremarkable in appearance. Other: No significant volume of ascites.  No pneumoperitoneum. Musculoskeletal: There are no aggressive appearing lytic or blastic lesions noted in the visualized portions of the skeleton.  IMPRESSION: 1. Persistent low right paratracheal lymphadenopathy, similar to the prior study. This may reflect residual disease. Stable area of presumed postradiation mass-like fibrosis in the perihilar aspect of the right upper lung. 2. Moderate right pleural effusion is similar to the prior examination. 3. Lymphadenopathy also noted in the right axillary region, right hemipelvis and retroperitoneum, as above, potentially reflective of metastatic disease. 4. Focal low-attenuation region in segment 4B of the liver, similar to prior noncontrast CT examination of the abdomen, likely to represent focal fatty infiltration. 5. Cholelithiasis without evidence of acute cholecystitis at this time. 6. Aortic atherosclerosis, in addition to 3 vessel coronary artery disease. Please note that although the presence of coronary artery calcium documents the presence of coronary artery disease, the severity of this disease and any potential stenosis cannot be assessed on this non-gated CT examination. Assessment for potential risk factor modification, dietary therapy or pharmacologic therapy may be warranted, if clinically indicated. 7. Additional incidental findings, as above. Electronically Signed   By: Vinnie Langton M.D.   On: 11/29/2018 14:09   Dg Chest Port 1 View  Result Date: 12/10/2018 CLINICAL DATA:  Shortness of breath. EXAM: PORTABLE CHEST 1 VIEW COMPARISON:  November 16, 2018 FINDINGS: Right injectable port in stable position. The cardiac silhouette is normal. Clear left lung. Increased right pleural effusion with volume loss in the right hemithorax. Stable right perihilar mass. Osseous structures are without acute abnormality. Soft tissues are grossly normal. IMPRESSION: 1. Increased right pleural effusion with volume loss in the right hemithorax. 2. Stable right perihilar mass. Electronically Signed   By: Fidela Salisbury M.D.   On: 12/10/2018 19:16   Dg Chest Port 1 View  Result Date: 11/16/2018 CLINICAL  DATA:  Chest pain, onset yesterday. Lung cancer with active chemotherapy. Thoracentesis earlier today. EXAM: PORTABLE CHEST 1 VIEW COMPARISON:  Radiographs and CT earlier this day. FINDINGS: Right chest port remains in place. Unchanged volume loss  in the right hemithorax. No pneumothorax. Right pleural fluid better demonstrated on prior CT. Unchanged heart size and mediastinal contours with right hilar prominence, stable. Left lung is clear. IMPRESSION: No pneumothorax following right thoracentesis. Unchanged right hemithorax volume loss and hilar density. Electronically Signed   By: Keith Rake M.D.   On: 11/16/2018 18:55   [place] Rt Lower Extrem Ltd Soft Tissue Non Vascular  Result Date: 11/16/2018 CLINICAL DATA:  Open wound of the right inner thigh. Evaluate for deep abscess. EXAM: ULTRASOUND right LOWER EXTREMITY LIMITED TECHNIQUE: Ultrasound examination of the lower extremity soft tissues was performed in the area of clinical concern. COMPARISON:  None. FINDINGS: Scanning in the region of concern does not show any evidence of a deep fluid collection. Mild nonspecific edema of the subcutaneous tissues. IMPRESSION: No visible abscess. Electronically Signed   By: Nelson Chimes M.D.   On: 11/16/2018 19:18   US Thoracentesis Asp Pleural Space W/img Guide  Result Date: 11/16/2018 INDICATION: History of metastatic lung cancer, recurrent right pleural effusion, dyspnea. Request for diagnostic and therapeutic right thoracentesis. EXAM: ULTRASOUND GUIDED RIGHT THORACENTESIS MEDICATIONS: 1% lidocaine 10 mL COMPLICATIONS: None immediate. PROCEDURE: An ultrasound guided thoracentesis was thoroughly discussed with the patient and questions answered. The benefits, risks, alternatives and complications were also discussed. The patient understands and wishes to proceed with the procedure. Written consent was obtained. Ultrasound was performed to localize and mark an adequate pocket of fluid in the right chest. The area  was then prepped and draped in the normal sterile fashion. 1% Lidocaine was used for local anesthesia. Under ultrasound guidance a 6 Fr Safe-T-Centesis catheter was introduced. Thoracentesis was performed. The catheter was removed and a dressing applied. FINDINGS: A total of approximately 450 mL of hazy yellow fluid was removed. Samples were sent to the laboratory as requested by the clinical team. IMPRESSION: Successful ultrasound guided right thoracentesis yielding 450 mL of pleural fluid. No pneumothorax on post-procedure chest x-ray. Read by: Gareth Eagle, PA-C Electronically Signed   By: Jacqulynn Cadet M.D.   On: 11/16/2018 14:11   CTA without findings of PE, however does have increased size of moderate to large right pleural effusion.  Patient reassessed, she is stable on 2 L supplemental oxygen.  Her heart rate is still around 130 so this is not changed very much since arrival in the ED.  She will need repeat admission for likely thoracentesis in the morning.  She has been updated and is agreeable with plan.  Discussed with Dr. Hal Hope-- will admit for ongoing care.  COVID screen pending.   Larene Pickett, PA-C 12/10/18 [id number]    Ward, Delice Bison, DO 12/11/18 [phone]

## 2018-12-10 NOTE — ED Notes (Signed)
ASSUMED CARE OF PT. AAOX3. PT IN NO APPARENT DISTRESS OR PAIN. AWAITING FURTHER ORDERS.

## 2018-12-10 NOTE — ED Provider Notes (Signed)
[facility] DEPT Provider Note   CSN: [id number] Arrival date & time: 12/10/18  1818     History   Chief Complaint Chief Complaint  Patient presents with   Shortness of Breath    HPI RAENAH MURLEY is a 53 y.o. female.     HPI Patient presents emergency room with complaints of acute shortness of breath.  Patient has a history of metastatic lung cancer.  Patient states she was at home today when she suddenly started to feel short of breath this evening.  Patient could barely speak.  She had to call 911.  When EMS arrived they placed her on nasal cannula oxygen.  Patient states she is actually feeling somewhat better now.  She denies any fevers.  She is had a slight cough.  She denies any chest pain.  No leg swelling.  Patient's last chemotherapy treatment was on September 2. Past Medical History:  Diagnosis Date   Arthritis    Asthma    Diabetes mellitus    Type II   Hemorrhoids    Hypercholesteremia    Hypertension    Hypothyroidism    Metastatic lung cancer (metastasis from lung to other site) (HCC)    Lung, Mets to skin- right flank. 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 mild - EF 45-50% by echo 10/2016 but EF 52% by low risk nuc 11/2016. b. Echo 07/2017 - EF normal, mild AI, unremarkable echo otherwise.   Obese    Pneumonia 2017   Sinus tachycardia    a. dates back to at least 2013, etiology unclear.    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   Counseling regarding advance care planning and goals of care 01/03/2018   Essential hypertension    DCM (dilated cardiomyopathy) ([place])    Non-ischemic cardiomyopathy ([place]) 02/04/2017   Metastasis to skin ([place]) 61/44/3154   Acute systolic heart failure ([place]) 12/01/2016   Primary cancer of right lung metastatic to other site ([place]) 11/24/2016   SOB (shortness of breath) 10/26/2016   Hypothyroidism 08/22/2016   PICC (peripherally inserted central catheter) flush 07/16/2016   Community acquired pneumonia    Sinus tachycardia    Thrush of mouth and esophagus ([place]) 04/07/2016   Constipation 04/07/2016   Palliative care by specialist    Port catheter in place 01/19/2016   Dehydration 01/14/2016   Tachycardia 01/14/2016   Adenocarcinoma of left lung, stage 3 ([place]) 01/01/2016   Non-small cell lung cancer ([place]) 12/31/2015   Mediastinal mass    Chest pain 12/26/2015   Headache 12/26/2015   Diabetes mellitus without complication ([place]) 00/86/7619    Past Surgical History:  Procedure Laterality Date   CESAREAN SECTION     COLONOSCOPY     I/D  arm Right 2013   IR FLUORO GUIDE PORT INSERTION RIGHT  02/18/2017  IR GENERIC HISTORICAL  01/08/2016   IR US GUIDE VASC ACCESS RIGHT 01/08/2016 [name], DO [facility]   IR GENERIC HISTORICAL  01/08/2016   IR FLUORO GUIDE CV LINE RIGHT 01/08/2016 [name], DO [facility]   IR THORACENTESIS ASP PLEURAL SPACE W/IMG GUIDE  05/25/2018   IR US GUIDE VASC ACCESS RIGHT  02/18/2017   MULTIPLE EXTRACTIONS WITH ALVEOLOPLASTY N/A 03/25/2017   Procedure: MULTIPLE EXTRACTION WITH ALVEOLOPLASTY (teeth #s four, six, seven, eight, nine, ten, eleven, one, twenty-three, twenty-four, twenty-five, twenty-six, twenty-nine, thirty);  Surgeon: [name], [name];  Location: [facility];  Service: Oral Surgery;  Laterality: N/A;   TUBAL LIGATION     US guided core needle biopsy     of right lower neck/supraclavicular lymph nodes     OB History   No obstetric history on file.      Home Medications    Prior to Admission medications   Medication Sig Start Date End Date Taking? Authorizing Provider  ACCU-CHEK AVIVA PLUS test strip CHECK BLOOD SUGAR THREE TIMES A DAY BEFORE MEALS DIRECTED 06/15/18   [provider]  ASPIRIN LOW DOSE 81 MG EC tablet TAKE 1 TABLET BY MOUTH EVERY DAY Patient taking differently: Take 81 mg by mouth daily. Enteric Coated.  ** DO NOT CRUSH ** 03/19/18   [name], MD  atorvastatin (LIPITOR) 40 MG tablet Take 1 tablet (40 mg total) by mouth daily at 6 PM. 04/30/18   Cristal Deer, MD  dexamethasone (DECADRON) 4 MG tablet Take 1 tab two times a day the day before Alimta chemo. Take 2 tabs two times a day starting the day after chemo for 3 days. Patient taking differently: Take 4-8 mg by mouth See admin instructions. Take 4 mg twice daily the day before Alimta chemo, then take 8 mg twice daily starting the day after chemo for 3 days. 09/20/18   [name], MD  DULoxetine (CYMBALTA) 60 MG capsule TAKE 1 CAPSULE BY MOUTH EVERY DAY Patient taking differently: Take 60 mg by mouth daily.  12/13/17   [name], MD  folic acid (FOLVITE) 1 MG tablet Take 1 tablet (1 mg total) by mouth daily. Start 5-7 days before Alimta chemotherapy. Continue until 21 days after Alimta completed. 09/20/18   [name], MD  glucose monitoring kit (FREESTYLE) monitoring kit 1 each by Does not apply route as needed for other. 11/05/18   [name], MD  hydrOXYzine (ATARAX/VISTARIL) 10 MG tablet Take 1 tablet (10 mg total) by mouth 3 (three) times daily as needed for itching. 04/26/18   [name], MD  insulin aspart (NOVOLOG) 100 UNIT/ML FlexPen As per sliding scale instructions Patient taking differently:  Inject 0-10 Units into the skin 3 (three) times daily. As per sliding scale instructions 01/16/16   [name], MD  Insulin Degludec (TRESIBA FLEXTOUCH) 200 UNIT/ML SOPN Inject 100 Units into the skin daily before breakfast.     [provider]  levalbuterol (XOPENEX HFA) 45 MCG/ACT inhaler Inhale 1-2 puffs into the lungs every 8 (eight) hours as needed for wheezing. 04/03/18   Gardenia Phlegm, NP  levothyroxine (SYNTHROID) 150 MCG tablet TAKE 1 TABLET BY MOUTH EVERY DAY BEFORE BREAKFAST Patient taking differently: Take 150 mcg by mouth daily before breakfast.  10/26/18   [name], MD  lidocaine-prilocaine (EMLA) cream Apply 1 application topically as needed. Patient taking differently: Apply 1 application topically as needed (port access).  08/23/18   Irene Limbo,  Cloria Spring, MD  lidocaine-prilocaine (EMLA) cream Apply to affected area once Patient not taking: Reported on 11/16/2018 09/20/18   [name], MD  lidocaine-prilocaine (EMLA) cream Apply to affected area once Patient not taking: Reported on 11/16/2018 09/20/18   [name], MD  liver oil-zinc oxide (DESITIN) 40 % ointment Apply topically 4 (four) times daily -  with meals and at bedtime. Apply to coccyx/gluteal fold wound 11/17/18   Donne Hazel, MD  LORazepam (ATIVAN) 0.5 MG tablet Take 1 tablet (0.5 mg total) by mouth every 6 (six) hours as needed (Nausea or vomiting). 10/25/18   [name], MD  magnesium oxide (MAG-OX) 400 (241.3 Mg) MG tablet Take 1 tablet (400 mg total) by mouth 2 (two) times daily. 10/23/18   [name], MD  metoprolol succinate (TOPROL-XL) 50 MG 24 hr tablet Take 1 tablet (81m) by mouth in the morning and 1/2 tablet (259m in the evening. Take with or immediately following a meal. Patient taking differently: Take 25-50 mg by mouth 2 (two) times daily. Take 5064my mouth in the morning and 81m77m the evening. Take with or immediately following a  meal. 08/10/18   MengAlmyra Deforest  mupirocin ointment (BACTROBAN) 2 % Apply topically 2 (two) times daily. Apply into wound bed on right inner upper thigh.  Cover with a small foam dressing. Change the foam daily and prn 11/17/18   ChiuDonne Hazel  naproxen (NAPROSYN) 500 MG tablet Take 1 tablet (500 mg total) by mouth 2 (two) times daily with a meal. Patient not taking: Reported on 08/10/2018 06/21/17   NanaVarney Biles  nitrofurantoin, macrocrystal-monohydrate, (MACROBID) 100 MG capsule Take 1 capsule (100 mg total) by mouth every 12 (twelve) hours. Patient not taking: Reported on 08/10/2018 04/30/18   UgahCristal Deer  ondansetron (ZOFRAN) 8 MG tablet Take 1 tablet (8 mg total) by mouth 2 (two) times daily as needed for refractory nausea / vomiting. Start on day 3 after chemo. 09/20/18   KaleBrunetta Genera  oxyCODONE (OXY IR/ROXICODONE) 5 MG immediate release tablet Take 1 tablet (5 mg total) by mouth every 4 (four) hours as needed for severe pain. 11/17/18   ChiuDonne Hazel  senna-docusate (SENNA S) 8.6-50 MG tablet Take 2 tablets by mouth 2 (two) times daily. May reduce to 2 tab po HS as needed once regular BM estabished Patient not taking: Reported on 11/16/2018 05/24/16   KaleBrunetta Genera  traZODone (DESYREL) 50 MG tablet Take 1-2 tablets (50-100 mg total) by mouth at bedtime as needed for sleep. 05/16/17   KaleBrunetta Genera  Vitamins A & D (VITAMIN A & D) OINT Apply 1 application topically 2 (two) times daily. Patient taking differently: Apply 1 application topically 2 (two) times daily as needed (itching).  03/06/18   KaleBrunetta Genera    Family History Family History  Problem Relation Age of Onset   Heart failure Mother    Hypertension Mother    Cancer Neg Hx    Rheumatologic disease Neg Hx     Social History Social History   Tobacco Use   Smoking status: Never Smoker   Smokeless tobacco: Never Used  Substance Use Topics   Alcohol use: No    Drug use: No     Allergies   Patient has no known allergies.   Review of Systems Review of Systems  All other systems reviewed and are negative.    Physical  Exam Updated Vital Signs BP (!) 149/81 (BP Location: Right Arm)    Pulse (!) 136    Temp 98.7 F (37.1 C) (Oral)    Resp (!) 28    LMP 01/21/2016 Comment: pt signed preg test waiver 04/16/16    SpO2 100%   Physical Exam Vitals signs and nursing note reviewed.  Constitutional:      Appearance: She is well-developed. She is not diaphoretic.  HENT:     Head: Normocephalic and atraumatic.     Right Ear: External ear normal.     Left Ear: External ear normal.  Eyes:     General: No scleral icterus.       Right eye: No discharge.        Left eye: No discharge.     Conjunctiva/sclera: Conjunctivae normal.  Neck:     Musculoskeletal: Neck supple.     Trachea: No tracheal deviation.  Cardiovascular:     Rate and Rhythm: Regular rhythm. Tachycardia present.  Pulmonary:     Effort: Pulmonary effort is normal. No respiratory distress.     Breath sounds: No stridor. Decreased breath sounds present. No wheezing or rales.     Comments: Decreased breath sounds left greater than right Abdominal:     General: Bowel sounds are normal. There is no distension.     Palpations: Abdomen is soft.     Tenderness: There is no abdominal tenderness. There is no guarding or rebound.  Musculoskeletal:        General: No tenderness.  Skin:    General: Skin is warm and dry.     Findings: No rash.  Neurological:     Mental Status: She is alert.     Cranial Nerves: No cranial nerve deficit (no facial droop, extraocular movements intact, no slurred speech).     Sensory: No sensory deficit.     Motor: No abnormal muscle tone or seizure activity.     Coordination: Coordination normal.      ED Treatments / Results  Labs (all labs ordered are listed, but only abnormal results are displayed) Labs Reviewed  BASIC METABOLIC PANEL - Abnormal;  Notable for the following components:      Result Value   Glucose, Bld 174 (*)    Calcium 8.4 (*)    All other components within normal limits  CBC - Abnormal; Notable for the following components:   RBC 2.62 (*)    Hemoglobin 8.3 (*)    HCT 25.8 (*)    RDW 18.9 (*)    All other components within normal limits  BASIC METABOLIC PANEL - Abnormal; Notable for the following components:   Glucose, Bld 231 (*)    Calcium 8.2 (*)    All other components within normal limits  CBC - Abnormal; Notable for the following components:   RBC 2.62 (*)    Hemoglobin 8.2 (*)    HCT 26.2 (*)    RDW 19.0 (*)    All other components within normal limits  GLUCOSE, CAPILLARY - Abnormal; Notable for the following components:   Glucose-Capillary 198 (*)    All other components within normal limits  BODY FLUID CELL COUNT WITH DIFFERENTIAL - Abnormal; Notable for the following components:   Color, Fluid YELLOW (*)    Appearance, Fluid HAZY (*)    Total Nucleated Cell Count, Fluid 1,273 (*)    Neutrophil Count, Fluid 66 (*)    Monocyte-Macrophage-Serous Fluid 8 (*)    All other components within normal limits  GLUCOSE, CAPILLARY - Abnormal; Notable for the following components:   Glucose-Capillary 233 (*)    All other components within normal limits  GLUCOSE, CAPILLARY - Abnormal; Notable for the following components:   Glucose-Capillary 198 (*)    All other components within normal limits  GLUCOSE, CAPILLARY - Abnormal; Notable for the following components:   Glucose-Capillary 182 (*)    All other components within normal limits  GLUCOSE, CAPILLARY - Abnormal; Notable for the following components:   Glucose-Capillary 115 (*)    All other components within normal limits  CBG MONITORING, ED - Abnormal; Notable for the following components:   Glucose-Capillary 209 (*)    All other components within normal limits  CULTURE, BLOOD (ROUTINE X 2)  CULTURE, BLOOD (ROUTINE X 2)  SARS CORONAVIRUS 2  (HOSPITAL ORDER, [facility])  BODY FLUID CULTURE  BRAIN NATRIURETIC PEPTIDE  LACTIC ACID, PLASMA  ALBUMIN, PLEURAL OR PERITONEAL FLUID  PROTEIN, PLEURAL OR PERITONEAL FLUID  GLUCOSE, CAPILLARY  TROPONIN I (HIGH SENSITIVITY)  TROPONIN I (HIGH SENSITIVITY)    EKG EKG Interpretation  Date/Time:  Sunday December 10 2018 18:35:30 EDT Ventricular Rate:  136 PR Interval:    QRS Duration: 85 QT Interval:  305 QTC Calculation: 459 R Axis:   32 Text Interpretation:  Sinus tachycardia Low voltage, extremity and precordial leads Since last tracing rate faster Confirmed by Dorie Rank [phone]) on 12/10/2018 6:53:19 PM   Radiology No results found.  Procedures Procedures (including critical care time)  Medications Ordered in ED Medications - No data to display   Initial Impression / Assessment and Plan / ED Course  I have reviewed the triage vital signs and the nursing notes.  Pertinent labs & imaging results that were available during my care of the patient were reviewed by me and considered in my medical decision making (see chart for details).  Clinical Course as of Dec 12 1950  Sun Dec 10, 2018  2003 Chest x-ray shows increasing pleural effusion.  I am not sure that would account for all of her shortness of breath.  Plan on CT scan to evaluate for pulmonary embolism.   [JK]    Clinical Course User Index [JK] Dorie Rank, MD    Pleural effusion noted however resp difficulty seems to be greater than the effusion would account for.  CT PE study ordered.  Care turned over to PA Sanders to follow up on CT study.  Final Clinical Impressions(s) / ED Diagnoses  pending   Dorie Rank, MD 12/12/18 1954

## 2018-12-11 ENCOUNTER — Observation Stay (HOSPITAL_COMMUNITY): Payer: Medicare Other

## 2018-12-11 ENCOUNTER — Encounter (HOSPITAL_COMMUNITY): Payer: Self-pay | Admitting: Internal Medicine

## 2018-12-11 DIAGNOSIS — E119 Type 2 diabetes mellitus without complications: Secondary | ICD-10-CM

## 2018-12-11 DIAGNOSIS — Z95828 Presence of other vascular implants and grafts: Secondary | ICD-10-CM | POA: Diagnosis not present

## 2018-12-11 DIAGNOSIS — Z20828 Contact with and (suspected) exposure to other viral communicable diseases: Secondary | ICD-10-CM | POA: Diagnosis present

## 2018-12-11 DIAGNOSIS — Z8701 Personal history of pneumonia (recurrent): Secondary | ICD-10-CM | POA: Diagnosis not present

## 2018-12-11 DIAGNOSIS — R000 Tachycardia, unspecified: Secondary | ICD-10-CM | POA: Diagnosis present

## 2018-12-11 DIAGNOSIS — E7800 Pure hypercholesterolemia, unspecified: Secondary | ICD-10-CM | POA: Diagnosis present

## 2018-12-11 DIAGNOSIS — J9601 Acute respiratory failure with hypoxia: Secondary | ICD-10-CM

## 2018-12-11 DIAGNOSIS — Z85118 Personal history of other malignant neoplasm of bronchus and lung: Secondary | ICD-10-CM | POA: Diagnosis not present

## 2018-12-11 DIAGNOSIS — Z794 Long term (current) use of insulin: Secondary | ICD-10-CM | POA: Diagnosis not present

## 2018-12-11 DIAGNOSIS — R0602 Shortness of breath: Secondary | ICD-10-CM | POA: Diagnosis not present

## 2018-12-11 DIAGNOSIS — Z7982 Long term (current) use of aspirin: Secondary | ICD-10-CM | POA: Diagnosis not present

## 2018-12-11 DIAGNOSIS — J90 Pleural effusion, not elsewhere classified: Secondary | ICD-10-CM | POA: Diagnosis not present

## 2018-12-11 DIAGNOSIS — Z8249 Family history of ischemic heart disease and other diseases of the circulatory system: Secondary | ICD-10-CM | POA: Diagnosis not present

## 2018-12-11 DIAGNOSIS — C799 Secondary malignant neoplasm of unspecified site: Secondary | ICD-10-CM | POA: Diagnosis not present

## 2018-12-11 DIAGNOSIS — C773 Secondary and unspecified malignant neoplasm of axilla and upper limb lymph nodes: Secondary | ICD-10-CM | POA: Diagnosis present

## 2018-12-11 DIAGNOSIS — C3491 Malignant neoplasm of unspecified part of right bronchus or lung: Secondary | ICD-10-CM | POA: Diagnosis present

## 2018-12-11 DIAGNOSIS — C801 Malignant (primary) neoplasm, unspecified: Secondary | ICD-10-CM | POA: Diagnosis not present

## 2018-12-11 DIAGNOSIS — D630 Anemia in neoplastic disease: Secondary | ICD-10-CM | POA: Diagnosis present

## 2018-12-11 DIAGNOSIS — T451X5A Adverse effect of antineoplastic and immunosuppressive drugs, initial encounter: Secondary | ICD-10-CM | POA: Diagnosis present

## 2018-12-11 DIAGNOSIS — E039 Hypothyroidism, unspecified: Secondary | ICD-10-CM | POA: Diagnosis present

## 2018-12-11 DIAGNOSIS — J9690 Respiratory failure, unspecified, unspecified whether with hypoxia or hypercapnia: Secondary | ICD-10-CM | POA: Diagnosis not present

## 2018-12-11 DIAGNOSIS — I5032 Chronic diastolic (congestive) heart failure: Secondary | ICD-10-CM | POA: Diagnosis present

## 2018-12-11 DIAGNOSIS — E1140 Type 2 diabetes mellitus with diabetic neuropathy, unspecified: Secondary | ICD-10-CM | POA: Diagnosis present

## 2018-12-11 DIAGNOSIS — I351 Nonrheumatic aortic (valve) insufficiency: Secondary | ICD-10-CM | POA: Diagnosis present

## 2018-12-11 DIAGNOSIS — F329 Major depressive disorder, single episode, unspecified: Secondary | ICD-10-CM | POA: Diagnosis present

## 2018-12-11 DIAGNOSIS — C792 Secondary malignant neoplasm of skin: Secondary | ICD-10-CM | POA: Diagnosis present

## 2018-12-11 DIAGNOSIS — G622 Polyneuropathy due to other toxic agents: Secondary | ICD-10-CM | POA: Diagnosis present

## 2018-12-11 DIAGNOSIS — J910 Malignant pleural effusion: Secondary | ICD-10-CM | POA: Diagnosis present

## 2018-12-11 DIAGNOSIS — I110 Hypertensive heart disease with heart failure: Secondary | ICD-10-CM | POA: Diagnosis present

## 2018-12-11 DIAGNOSIS — I428 Other cardiomyopathies: Secondary | ICD-10-CM | POA: Diagnosis present

## 2018-12-11 LAB — BASIC METABOLIC PANEL
Anion gap: 9 (ref 5–15)
BUN: 10 mg/dL (ref 6–20)
CO2: 23 mmol/L (ref 22–32)
Calcium: 8.2 mg/dL — ABNORMAL LOW (ref 8.9–10.3)
Chloride: 106 mmol/L (ref 98–111)
Creatinine, Ser: 0.75 mg/dL (ref 0.44–1.00)
GFR calc Af Amer: >60 mL/min (ref >60)
GFR calc non Af Amer: >60 mL/min (ref >60)
Glucose, Bld: 231 mg/dL — ABNORMAL HIGH (ref 70–99)
Potassium: 3.9 mmol/L (ref 3.5–5.1)
Sodium: 138 mmol/L (ref 135–145)

## 2018-12-11 LAB — CBC
HCT: 26.2 % — ABNORMAL LOW (ref 36.0–46.0)
Hemoglobin: 8.2 g/dL — ABNORMAL LOW (ref 12.0–15.0)
MCH: 31.3 pg (ref 26.0–34.0)
MCHC: 31.3 g/dL (ref 30.0–36.0)
MCV: 100 fL (ref 80.0–100.0)
Platelets: 240 10*3/uL (ref 150–400)
RBC: 2.62 MIL/uL — ABNORMAL LOW (ref 3.87–5.11)
RDW: 19 % — ABNORMAL HIGH (ref 11.5–15.5)
WBC: 7.4 10*3/uL (ref 4.0–10.5)
nRBC: 0 % (ref 0.0–0.2)

## 2018-12-11 LAB — ALBUMIN, PLEURAL OR PERITONEAL FLUID: Albumin, Fluid: 2.7 g/dL

## 2018-12-11 LAB — GLUCOSE, CAPILLARY
Glucose-Capillary: 198 mg/dL — ABNORMAL HIGH (ref 70–99)
Glucose-Capillary: 233 mg/dL — ABNORMAL HIGH (ref 70–99)
Glucose-Capillary: 198 mg/dL — ABNORMAL HIGH (ref 70–99)
Glucose-Capillary: 182 mg/dL — ABNORMAL HIGH (ref 70–99)

## 2018-12-11 LAB — BODY FLUID CELL COUNT WITH DIFFERENTIAL
Lymphs, Fluid: 26 %
Monocyte-Macrophage-Serous Fluid: 8 % — ABNORMAL LOW (ref 50–90)
Neutrophil Count, Fluid: 66 % — ABNORMAL HIGH (ref 0–25)
Total Nucleated Cell Count, Fluid: 1273 cu mm — ABNORMAL HIGH (ref 0–1000)

## 2018-12-11 LAB — SARS CORONAVIRUS 2 BY RT PCR (HOSPITAL ORDER, PERFORMED IN ~~LOC~~ HOSPITAL LAB): SARS Coronavirus 2: NEGATIVE

## 2018-12-11 LAB — CBG MONITORING, ED: Glucose-Capillary: 209 mg/dL — ABNORMAL HIGH (ref 70–99)

## 2018-12-11 LAB — PROTEIN, PLEURAL OR PERITONEAL FLUID: Total protein, fluid: 4.8 g/dL

## 2018-12-11 LAB — TROPONIN I (HIGH SENSITIVITY): Troponin I (High Sensitivity): 14 ng/L (ref <18)

## 2018-12-11 MED ORDER — LIDOCAINE HCL 1 % IJ SOLN
INTRAMUSCULAR | Status: AC
  Filled 2018-12-11: qty 20

## 2018-12-11 MED ORDER — POLYETHYLENE GLYCOL 3350 17 G PO PACK
17.0000 g | PACK | Freq: Every day | ORAL | Status: DC
  Administered 2018-12-11 – 2018-12-12 (×2): 17 g via ORAL
  Filled 2018-12-11 (×2): qty 1

## 2018-12-11 MED ORDER — INSULIN ASPART 100 UNIT/ML ~~LOC~~ SOLN
0.0000 [IU] | Freq: Three times a day (TID) | SUBCUTANEOUS | Status: DC
  Administered 2018-12-11: 2 [IU] via SUBCUTANEOUS
  Administered 2018-12-11: 3 [IU] via SUBCUTANEOUS
  Administered 2018-12-11: 2 [IU] via SUBCUTANEOUS

## 2018-12-11 MED ORDER — ATORVASTATIN CALCIUM 40 MG PO TABS
40.0000 mg | ORAL_TABLET | Freq: Every day | ORAL | Status: DC
  Administered 2018-12-11: 40 mg via ORAL
  Filled 2018-12-11: qty 1

## 2018-12-11 MED ORDER — PANTOPRAZOLE SODIUM 40 MG PO TBEC: 40.0000 mg | DELAYED_RELEASE_TABLET | Freq: Every day | ORAL | 2 refills | Status: AC

## 2018-12-11 MED ORDER — METOPROLOL SUCCINATE ER 50 MG PO TB24
50.0000 mg | ORAL_TABLET | Freq: Every morning | ORAL | Status: DC
  Administered 2018-12-11 – 2018-12-12 (×2): 50 mg via ORAL
  Filled 2018-12-11: qty 1

## 2018-12-11 MED ORDER — METOPROLOL SUCCINATE ER 25 MG PO TB24
25.0000 mg | ORAL_TABLET | Freq: Every day | ORAL | Status: DC
  Administered 2018-12-11: 25 mg via ORAL
  Filled 2018-12-11: qty 1

## 2018-12-11 MED ORDER — ALTEPLASE 2 MG IJ SOLR
2.0000 mg | Freq: Once | INTRAMUSCULAR | Status: AC
  Administered 2018-12-11: 2 mg
  Filled 2018-12-11: qty 2

## 2018-12-11 MED ORDER — INSULIN GLARGINE 100 UNIT/ML ~~LOC~~ SOLN
100.0000 [IU] | Freq: Every day | SUBCUTANEOUS | Status: DC
  Administered 2018-12-11 – 2018-12-12 (×2): 100 [IU] via SUBCUTANEOUS
  Filled 2018-12-11 (×2): qty 1

## 2018-12-11 MED ORDER — MAGNESIUM OXIDE 400 (241.3 MG) MG PO TABS
400.0000 mg | ORAL_TABLET | Freq: Two times a day (BID) | ORAL | Status: DC
  Administered 2018-12-11 – 2018-12-12 (×3): 400 mg via ORAL
  Filled 2018-12-11 (×3): qty 1

## 2018-12-11 MED ORDER — ACETAMINOPHEN 650 MG RE SUPP: 650.0000 mg | Freq: Four times a day (QID) | RECTAL | Status: DC | PRN

## 2018-12-11 MED ORDER — ENSURE ENLIVE PO LIQD: 237.0000 mL | Freq: Two times a day (BID) | ORAL | Status: DC

## 2018-12-11 MED ORDER — ONDANSETRON HCL 4 MG/2ML IJ SOLN
4.0000 mg | Freq: Four times a day (QID) | INTRAMUSCULAR | Status: DC | PRN
  Administered 2018-12-11 – 2018-12-12 (×2): 4 mg via INTRAVENOUS
  Filled 2018-12-11 (×2): qty 2

## 2018-12-11 MED ORDER — ACETAMINOPHEN 325 MG PO TABS: 650.0000 mg | ORAL_TABLET | Freq: Four times a day (QID) | ORAL | Status: DC | PRN

## 2018-12-11 MED ORDER — ADULT MULTIVITAMIN W/MINERALS CH
1.0000 | ORAL_TABLET | Freq: Every day | ORAL | Status: DC
  Administered 2018-12-12: 1 via ORAL
  Filled 2018-12-11: qty 1

## 2018-12-11 MED ORDER — LEVOTHYROXINE SODIUM 50 MCG PO TABS
150.0000 ug | ORAL_TABLET | Freq: Every day | ORAL | Status: DC
  Administered 2018-12-11 – 2018-12-12 (×2): 150 ug via ORAL
  Filled 2018-12-11 (×2): qty 1

## 2018-12-11 MED ORDER — PRO-STAT SUGAR FREE PO LIQD
30.0000 mL | Freq: Two times a day (BID) | ORAL | Status: DC
  Administered 2018-12-11 – 2018-12-12 (×2): 30 mL via ORAL
  Filled 2018-12-11 (×2): qty 30

## 2018-12-11 MED ORDER — SENNOSIDES-DOCUSATE SODIUM 8.6-50 MG PO TABS: 2.0000 | ORAL_TABLET | Freq: Every day | ORAL | 1 refills | Status: AC

## 2018-12-11 MED ORDER — DICLOFENAC SODIUM 1 % TD GEL: 2.0000 g | Freq: Two times a day (BID) | TRANSDERMAL | 1 refills | Status: AC | PRN

## 2018-12-11 MED ORDER — ONDANSETRON HCL 4 MG PO TABS: 4.0000 mg | ORAL_TABLET | Freq: Four times a day (QID) | ORAL | Status: DC | PRN

## 2018-12-11 MED ORDER — HYDROXYZINE HCL 10 MG PO TABS: 10.0000 mg | ORAL_TABLET | Freq: Three times a day (TID) | ORAL | Status: DC | PRN

## 2018-12-11 MED ORDER — METOPROLOL SUCCINATE ER 25 MG PO TB24
25.0000 mg | ORAL_TABLET | Freq: Two times a day (BID) | ORAL | Status: DC
  Filled 2018-12-11: qty 2

## 2018-12-11 MED ORDER — LORAZEPAM 0.5 MG PO TABS: 0.5000 mg | ORAL_TABLET | Freq: Four times a day (QID) | ORAL | Status: DC | PRN

## 2018-12-11 MED ORDER — LEVALBUTEROL HCL 0.63 MG/3ML IN NEBU: 0.6300 mg | INHALATION_SOLUTION | Freq: Three times a day (TID) | RESPIRATORY_TRACT | Status: DC | PRN

## 2018-12-11 MED ORDER — ASPIRIN EC 81 MG PO TBEC
81.0000 mg | DELAYED_RELEASE_TABLET | Freq: Every day | ORAL | Status: DC
  Administered 2018-12-11 – 2018-12-12 (×2): 81 mg via ORAL
  Filled 2018-12-11 (×2): qty 1

## 2018-12-11 MED ORDER — FOLIC ACID 1 MG PO TABS
1.0000 mg | ORAL_TABLET | Freq: Every day | ORAL | Status: DC
  Administered 2018-12-11 – 2018-12-12 (×2): 1 mg via ORAL
  Filled 2018-12-11 (×2): qty 1

## 2018-12-11 MED ORDER — STERILE WATER FOR INJECTION IJ SOLN
INTRAMUSCULAR | Status: AC
  Administered 2018-12-11: 07:00:00
  Filled 2018-12-11: qty 10

## 2018-12-11 MED ORDER — OXYCODONE HCL 5 MG PO TABS: 5.0000 mg | ORAL_TABLET | ORAL | Status: DC | PRN

## 2018-12-11 MED ORDER — DULOXETINE HCL 60 MG PO CPEP
60.0000 mg | ORAL_CAPSULE | Freq: Every day | ORAL | Status: DC
  Administered 2018-12-11 – 2018-12-12 (×2): 60 mg via ORAL
  Filled 2018-12-11 (×2): qty 1

## 2018-12-11 NOTE — ED Notes (Signed)
ED TO INPATIENT HANDOFF REPORT  Name/Age/Gender Marilyn [name] 53 y.o. female  Code Status Code Status History    Date Active Date Inactive Code Status Order ID Comments User Context   11/16/2018 1746 11/17/2018 2129 Full Code [id number]  Elodia Florence., MD Inpatient   04/29/2018 0102 04/30/2018 1642 Full Code [id number]  Ivor Costa, MD ED   07/27/2017 1757 07/29/2017 1807 Full Code [id number]  Velvet Bathe, MD Inpatient   12/26/2015 0806 12/30/2015 2053 Full Code [id number]  Elgergawy, Silver Huguenin, MD Inpatient   Advance Care Planning Activity      Home/SNF/Other Home  Chief Complaint SOB  Level of Care/Admitting Diagnosis ED Disposition    ED Disposition Condition [place]: [place] [[id number]]  Level of Care: Telemetry [5]  Admit to tele based on following criteria: Monitor for Ischemic changes  Covid Evaluation: Asymptomatic Screening Protocol (No Symptoms)  Diagnosis: Acute respiratory failure with hypoxia [facility]) [124580]  Admitting Physician: Rise Patience [phone]  Attending Physician: Rise Patience [email or web address]  PT Class (Do Not Modify): Observation [104]  PT Acc Code (Do Not Modify): Observation [10022]       Medical History Past Medical History:  Diagnosis Date  . Arthritis   . Asthma   . Diabetes mellitus    Type II  . Hemorrhoids   . Hypercholesteremia   . Hypertension   . Hypothyroidism   . Metastatic lung cancer (metastasis from lung to other site) (HCC)    Lung, Mets to skin- right flank. CT angio 2019 suspicious for axillary, external iliac, right inguinal mets  . 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 NICM (nonischemic cardiomyopathy) ([place])    a. mild - EF 45-50% by echo 10/2016 but EF 52% by low risk nuc 11/2016. b. Echo 07/2017 - EF normal, mild AI, unremarkable echo otherwise.  . Obese   . Pneumonia 2017  . Sinus tachycardia    a. dates back to at least 2013, etiology unclear.     Allergies No Known Allergies  IV Location/Drains/Wounds Patient Lines/Drains/Airways Status   Active Line/Drains/Airways    Name:   Placement date:   Placement time:   Site:   Days:   Implanted Port 02/18/17 Right Chest   02/18/17    1330    Chest   661   Incision (Closed) 03/25/17 Lip Other (Comment)   03/25/17    0916     626   Pressure Injury 11/16/18 Buttocks Right;Medial Stage II -  Partial thickness loss of dermis presenting as a shallow open ulcer with a red, pink wound bed without slough. right inner buttock   11/16/18    1815     25   Wound / Incision (Open or Dehisced) 11/16/18 Other (Comment) Thigh Right;Medial Abscess to right inner thigh that was I&D recently by MD   11/16/18    1810    Thigh   25          Labs/Imaging Results for orders placed or performed during the hospital encounter of 12/10/18 (from the past 48 hour(s))  Basic metabolic panel     Status: Abnormal   Collection Time: 12/10/18  6:42 PM  Result Value Ref Range   Sodium 137 135 - 145 mmol/L   Potassium 3.8 3.5 - 5.1 mmol/L   Chloride 106 98 - 111 mmol/L   CO2 22 22 - 32 mmol/L   Glucose, Bld 174 (H)  70 - 99 mg/dL   BUN 10 6 - 20 mg/dL   Creatinine, Ser 0.84 0.44 - 1.00 mg/dL   Calcium 8.4 (L) 8.9 - 10.3 mg/dL   GFR calc non Af Amer >60 >60 mL/min   GFR calc Af Amer >60 >60 mL/min   Anion gap 9 5 - 15    Comment: Performed at [facility], [place] [address], [place], [place] [address]  CBC     Status: Abnormal   Collection Time: 12/10/18  6:42 PM  Result Value Ref Range   WBC 8.5 4.0 - 10.5 K/uL   RBC 2.62 (L) 3.87 - 5.11 MIL/uL   Hemoglobin 8.3 (L) 12.0 - 15.0 g/dL   HCT 25.8 (L) 36.0 - 46.0 %   MCV 98.5 80.0 - 100.0 fL   MCH 31.7 26.0 - 34.0 pg   MCHC 32.2 30.0 - 36.0 g/dL   RDW 18.9 (H) 11.5 - 15.5 %   Platelets 262 150 - 400 K/uL   nRBC 0.0 0.0 - 0.2 %    Comment: Performed at [facility], [place] [address] [place], [place] [address]  Brain  natriuretic peptide     Status: None   Collection Time: 12/10/18  6:42 PM  Result Value Ref Range   B Natriuretic Peptide 40.5 0.0 - 100.0 pg/mL    Comment: Performed at [facility], [place] [address], [place], [place] [address]  Troponin I (High Sensitivity)     Status: None   Collection Time: 12/10/18  6:42 PM  Result Value Ref Range   Troponin I (High Sensitivity) 17 <18 ng/L    Comment: (NOTE) Elevated high sensitivity troponin I (hsTnI) values and significant  changes across serial measurements may suggest ACS but many other  chronic and acute conditions are known to elevate hsTnI results.  Refer to the "Links" section for chest pain algorithms and additional  guidance. Performed at [facility], [place] [address] [place], [place] [address]   Lactic acid, plasma     Status: None   Collection Time: 12/10/18  6:45 PM  Result Value Ref Range   Lactic Acid, Venous 1.6 0.5 - 1.9 mmol/L    Comment: Performed at [facility], [place] [address], [place], [place] [address]  SARS Coronavirus 2 [facility] order, Performed in [facility] hospital lab) Nasopharyngeal Nasopharyngeal Swab     Status: None   Collection Time: 12/11/18 12:07 AM   Specimen: Nasopharyngeal Swab  Result Value Ref Range   SARS Coronavirus 2 NEGATIVE NEGATIVE    Comment: (NOTE) If result is NEGATIVE SARS-CoV-2 target nucleic acids are NOT DETECTED. The SARS-CoV-2 RNA is generally detectable in upper and lower  respiratory specimens during the acute phase of infection. The lowest  concentration of SARS-CoV-2 viral copies this assay can detect is 250  copies / mL. A negative result does not preclude SARS-CoV-2 infection  and should not be used as the sole basis for treatment or other  patient management decisions.  A negative result may occur with  improper specimen collection / handling, submission of specimen other  than nasopharyngeal swab, presence of viral  mutation(s) within the  areas targeted by this assay, and inadequate number of viral copies  (<250 copies / mL). A negative result must be combined with clinical  observations, patient history, and epidemiological information. If result is POSITIVE SARS-CoV-2 target nucleic acids are DETECTED. The SARS-CoV-2 RNA is generally detectable in upper and lower  respiratory  specimens dur ing the acute phase of infection.  Positive  results are indicative of active infection with SARS-CoV-2.  Clinical  correlation with patient history and other diagnostic information is  necessary to determine patient infection status.  Positive results do  not rule out bacterial infection or co-infection with other viruses. If result is PRESUMPTIVE POSTIVE SARS-CoV-2 nucleic acids MAY BE PRESENT.   A presumptive positive result was obtained on the submitted specimen  and confirmed on repeat testing.  While 2019 novel coronavirus  (SARS-CoV-2) nucleic acids may be present in the submitted sample  additional confirmatory testing may be necessary for epidemiological  and / or clinical management purposes  to differentiate between  SARS-CoV-2 and other Sarbecovirus currently known to infect humans.  If clinically indicated additional testing with an alternate test  methodology [phone]) is advised. The SARS-CoV-2 RNA is generally  detectable in upper and lower respiratory sp ecimens during the acute  phase of infection. The expected result is Negative. Fact Sheet for Patients:  StrictlyIdeas.no Fact Sheet for Healthcare Providers: BankingDealers.co.za This test is not yet approved or cleared by the [place] FDA and has been authorized for detection and/or diagnosis of SARS-CoV-2 by FDA under an Emergency Use Authorization (EUA).  This EUA will remain in effect (meaning this test can be used) for the duration of the COVID-19 declaration under Section 564(b)(1)  of the Act, 21 U.S.C. section 360bbb-3(b)(1), unless the authorization is terminated or revoked sooner. Performed at [facility], [place] [address] [place], [place] [address]   CBG monitoring, ED     Status: Abnormal   Collection Time: 12/11/18  2:07 AM  Result Value Ref Range   Glucose-Capillary 209 (H) 70 - 99 mg/dL   Ct Angio Chest Pe W And/or Wo Contrast  Result Date: 12/10/2018 CLINICAL DATA:  Shortness of breath and coughing EXAM: CT ANGIOGRAPHY CHEST WITH CONTRAST TECHNIQUE: Multidetector CT imaging of the chest was performed using the standard protocol during bolus administration of intravenous contrast. Multiplanar CT image reconstructions and MIPs were obtained to evaluate the vascular anatomy. CONTRAST:  115mL OMNIPAQUE IOHEXOL 350 MG/ML SOLN COMPARISON:  11/29/2018 FINDINGS: The study is somewhat limited due to patient motion. Cardiovascular: There is a optimal opacification of the pulmonary arteries. There is no central,segmental, or subsegmental filling defects within the pulmonary arteries. There is mild cardiomegaly. No evidence of right ventricular heart strain. There is normal three-vessel brachiocephalic anatomy without proximal stenosis. The thoracic aorta is normal in appearance. Mediastinum/Nodes: There is soft tissue thickening seen at along the right paratracheal stripe as on the prior exam. There is marked right axillary adenopathy with areas of central necrosis and surrounding fat stranding changes as on the prior exam. Lungs/Pleura: There is interval large min of a moderate to large right pleural effusion. Postradiation fibrotic changes are seen in the medial right upper lung. There is hazy bibasilar atelectasis. Upper Abdomen: No acute abnormalities present in the visualized portions of the upper abdomen. Musculoskeletal: No chest wall abnormality. No acute or significant osseous findings. Review of the MIP images confirms the above findings. IMPRESSION: 1.  No pulmonary embolism. 2. Interval enlargement of moderate to large right pleural effusion 3. Post radiation fibrotic changes seen in the medial right upper lung. 4. Right axillary adenopathy with surrounding inflammatory changes and central necrosis. Electronically Signed   By: Prudencio Pair M.D.   On: 12/10/2018 23:16   Dg Chest Port 1 View  Result Date: 12/10/2018 CLINICAL DATA:  Shortness of breath.  EXAM: PORTABLE CHEST 1 VIEW COMPARISON:  November 16, 2018 FINDINGS: Right injectable port in stable position. The cardiac silhouette is normal. Clear left lung. Increased right pleural effusion with volume loss in the right hemithorax. Stable right perihilar mass. Osseous structures are without acute abnormality. Soft tissues are grossly normal. IMPRESSION: 1. Increased right pleural effusion with volume loss in the right hemithorax. 2. Stable right perihilar mass. Electronically Signed   By: Fidela Salisbury M.D.   On: 12/10/2018 19:16    Pending Labs Unresulted Labs (From admission, onward)    Start     Ordered   12/10/18 1845  Blood culture (routine x 2)  BLOOD CULTURE X 2,   STAT     12/10/18 1845   Signed and Held  Basic metabolic panel  Tomorrow morning,   R     Signed and Held   Signed and Held  CBC  Tomorrow morning,   R     Signed and Held          Vitals/Pain Today's Vitals   12/11/18 0100 12/11/18 0200 12/11/18 0300 12/11/18 0325  BP: (!) 163/95 122/76 137/78   Pulse: (!) 131 (!) 136 (!) 125   Resp: (!) 22 (!) 27 (!) 28   Temp:    99.4 F (37.4 C)  TempSrc:    Oral  SpO2: 100% 100% 100%   Weight:      Height:      PainSc:    0-No pain    Isolation Precautions No active isolations  Medications Medications  sodium chloride 0.9 % bolus 500 mL (0 mLs Intravenous Stopped 12/10/18 2203)    Followed by  0.9 %  sodium chloride infusion (1,000 mLs Intravenous New Bag/Given 12/11/18 0305)  sodium chloride flush (NS) 0.9 % injection 10-40 mL (has no administration in time  range)  sodium chloride (PF) 0.9 % injection (  Given by Other 12/10/18 2304)  iohexol (OMNIPAQUE) 350 MG/ML injection 100 mL (100 mLs Intravenous Contrast Given 12/10/18 2245)    Mobility walks

## 2018-12-11 NOTE — Progress Notes (Signed)
At this time attempted to remove cath flow that was placed at 645am, and was unsuccessful, cath flow replaced and RN notified that this RN will return in about an hour

## 2018-12-11 NOTE — Progress Notes (Signed)
Patient seen and examined at bedside, patient admitted after midnight, please see earlier detailed admission note by Rise Patience, MD. Briefly, patient presented with shortness of breath in setting of recurrent malignant pleural effusion. Plan for thoracentesis today. May need to consider placement of a Pleurx catheter. Will get in touch with patient's oncology team to suggest. PT in AM. Wean off of oxygen as able. On exam, patient appears comfortable, but worsening dyspnea with extended speech.    Cordelia Poche, MD [facility] 12/11/2018, 10:41 AM

## 2018-12-11 NOTE — Progress Notes (Signed)
Initial Nutrition Assessment  RD working remotely.   DOCUMENTATION CODES:   Obesity unspecified  INTERVENTION:  - will order Ensure Enlive BID, each supplement provides 350 kcal and 20 grams of protein. - will order 30 mL Prostat BID, each supplement provides 100 kcal and 15 grams of protein. - will order daily multivitamin with minerals. - continue to encourage PO intakes.    NUTRITION DIAGNOSIS:   Increased nutrient needs related to cancer and cancer related treatments, chronic illness, acute illness as evidenced by estimated needs.  GOAL:   Patient will meet greater than or equal to 90% of their needs  MONITOR:   PO intake, Supplement acceptance, Labs, Weight trends  REASON FOR ASSESSMENT:   Malnutrition Screening Tool  ASSESSMENT:   53 y.o. female with medical history of arthritis, asthma, type 2 DM, hemorrhoids, HTN, hypothyroidism, metastatic adenocarcinoma of the lung, neuropathy, non-ischemic cardiomyopathy, and obesity. She presented to the ED on 9/6 due to worsening SOB x24 hours with R-side body and chest pain. She feels SOB even at rest. No fever, chills, or cough. CT chest showed worsening R-sided PE. She was admitted for acute respiratory failure 2/2 worsening R-sided PE with known history of metastatic adenocarcinoma of the lung.  Patient is currently out of the room to Diagnostic Radiology for thoracentesis (400 ml yellow fluid removed). No intakes documented since admission. Per chart review, current weight is 261 lb and weight has been stable for the past 3 months. Weight on 5/27 was 268 lb. Will monitor closely given recurrent PEs.   Per notes: - acute respiratory failure with hypoxia 2/2 worsening PEs with hx of metastatic lung cancer, undergoing chemoradiation - anemia of chronic disease - hypothyroidism - hx of depression--cymbalta - patient is OBS.    Labs reviewed; CBGs: 209, 198, and 233 mg/dl, Ca: 8.2 mg/dl. Medications reviewed; 1 mg  folvite/day, sliding scale novolog, 100 units lantus/day, 150 mcg oral synthroid/day, 400 mg mag-ox BID, 1 packet miralax/day.      NUTRITION - FOCUSED PHYSICAL EXAM:  unable to complete at this time.   Diet Order:   Diet Order            Diet heart healthy/carb modified Room service appropriate? Yes; Fluid consistency: Thin  Diet effective now              EDUCATION NEEDS:   No education needs have been identified at this time  Skin:  Skin Assessment: Skin Integrity Issues: Skin Integrity Issues:: Other (Comment) Other: non-pressure wound to R thigh  Last BM:  PTA/unknown  Height:   Ht Readings from Last 1 Encounters:  12/10/18 5\' 9"  (1.753 m)    Weight:   Wt Readings from Last 1 Encounters:  12/11/18 118.6 kg    Ideal Body Weight:  65.9 kg  BMI:  Body mass index is 38.61 kg/m.  Estimated Nutritional Needs:   Kcal:  2250-2450 kcal  Protein:  115-125 grams  Fluid:  >/= 2.2 L/day      Jarome Matin, MS, RD, LDN, [facility] Inpatient Clinical Dietitian Pager # [phone] After hours/weekend pager # [phone]

## 2018-12-11 NOTE — Procedures (Signed)
   US guided Rt thoracentesis 400 cc yellow fluid obtained  Sent for labs  Tolerated well  cxr pending

## 2018-12-11 NOTE — ED Notes (Signed)
WILL TRANSPORT PT TO 4W 1439-1. AAOX4. PT IN NO APPARENT DISTRESS OR PAIN. THE OPPORTUNITY TO ASK QUESTIONS WAS PROVIDED. IVF INFUSING W/O PAIN OR SWELLING.

## 2018-12-11 NOTE — H&P (Signed)
History and Physical    Marilyn [name] JEH:[id number] DOB: January 08, 1966 DOA: 12/10/2018  PCP: [name], FNP  Patient coming from: Home.  Chief Complaint: Shortness of breath.  HPI: Marilyn [name] is a 53 y.o. female with history of metastatic adenocarcinoma followed by Dr. Irene Limbo oncologist presents to the ER with worsening shortness of breath over the last 24 hours with right-sided body taper chest pain.  Denies any fever chills productive cough.  Patient states her shortness of breath was worse than what she was when she was admitted last month for pleural effusion.  Patient shortness of breath is present even at rest.  ED Course: In the ER patient is tachycardic.  CT angiogram of the chest shows worsening right-sided pleural effusion moderately large size.  Patient is tachycardic and has known history of sinus tachycardia for which patient takes metoprolol.  Patient was afebrile with COVID-19 test negative.  Labs revealed anemia which is chronic with creatinine 1.8 BNP normal troponin negative.  Patient admitted for acute respiratory failure secondary to worsening right-sided pleural effusion with known history of metastatic adenocarcinoma of the lung.  Review of Systems: As per HPI, rest all negative.   Past Medical History:  Diagnosis Date   Arthritis    Asthma    Diabetes mellitus    Type II   Hemorrhoids    Hypercholesteremia    Hypertension    Hypothyroidism    Metastatic lung cancer (metastasis from lung to other site) (HCC)    Lung, Mets to skin- right flank. 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 mild - EF 45-50% by echo 10/2016 but EF 52% by low risk nuc 11/2016. b. Echo 07/2017 - EF normal, mild AI, unremarkable echo otherwise.   Obese    Pneumonia 2017   Sinus tachycardia    a. dates back to at least 2013,  etiology unclear.    Past Surgical History:  Procedure Laterality Date   CESAREAN SECTION     COLONOSCOPY     I/D  arm Right 2013   IR FLUORO GUIDE PORT INSERTION RIGHT  02/18/2017   IR GENERIC HISTORICAL  01/08/2016   IR US GUIDE VASC ACCESS RIGHT 01/08/2016 [name], DO [facility]   IR GENERIC HISTORICAL  01/08/2016   IR FLUORO GUIDE CV LINE RIGHT 01/08/2016 [name], DO [facility]   IR THORACENTESIS ASP PLEURAL SPACE W/IMG GUIDE  05/25/2018   IR US GUIDE VASC ACCESS RIGHT  02/18/2017   MULTIPLE EXTRACTIONS WITH ALVEOLOPLASTY N/A 03/25/2017   Procedure: MULTIPLE EXTRACTION WITH ALVEOLOPLASTY (teeth #s four, six, seven, eight, nine, ten, eleven, one, twenty-three, twenty-four, twenty-five, twenty-six, twenty-nine, thirty);  Surgeon: [name], [name];  Location: [facility];  Service: Oral Surgery;  Laterality: N/A;   TUBAL LIGATION     US guided core needle biopsy     of right lower neck/supraclavicular lymph nodes     reports that she has never smoked. She has never used smokeless tobacco. She reports that she does not drink alcohol or use drugs.  No Known Allergies  Family History  Problem Relation Age of Onset   Heart failure Mother    Hypertension Mother    Cancer Neg Hx    Rheumatologic disease Neg Hx     Prior to Admission medications   Medication Sig Start Date End  Date Taking? Authorizing Provider  ACCU-CHEK AVIVA PLUS test strip 1 each by Other route 3 (three) times daily.  06/15/18  Yes [provider]  ASPIRIN LOW DOSE 81 MG EC tablet TAKE 1 TABLET BY MOUTH EVERY DAY Patient taking differently: Take 81 mg by mouth daily. Enteric Coated.  ** DO NOT CRUSH ** 03/19/18  Yes [name], MD  atorvastatin (LIPITOR) 40 MG tablet Take 1 tablet (40 mg total) by mouth daily at 6 PM. 04/30/18  Yes Cristal Deer, MD  dexamethasone (DECADRON) 4 MG tablet Take 1 tab two times a day the day before Alimta chemo. Take 2 tabs two times a day  starting the day after chemo for 3 days. Patient taking differently: Take 4-8 mg by mouth See admin instructions. Take 4 mg twice daily the day before Alimta chemo, then take 8 mg twice daily starting the day after chemo for 3 days. 09/20/18  Yes [name], MD  DULoxetine (CYMBALTA) 60 MG capsule TAKE 1 CAPSULE BY MOUTH EVERY DAY Patient taking differently: Take 60 mg by mouth daily.  12/13/17  Yes [name], MD  folic acid (FOLVITE) 1 MG tablet Take 1 tablet (1 mg total) by mouth daily. Start 5-7 days before Alimta chemotherapy. Continue until 21 days after Alimta completed. 09/20/18  Yes [name], MD  glucose monitoring kit (FREESTYLE) monitoring kit 1 each by Does not apply route as needed for other. 11/05/18  Yes [name], MD  hydrOXYzine (ATARAX/VISTARIL) 10 MG tablet Take 1 tablet (10 mg total) by mouth 3 (three) times daily as needed for itching. 04/26/18  Yes [name], MD  insulin aspart (NOVOLOG) 100 UNIT/ML FlexPen As per sliding scale instructions Patient taking differently: Inject 0-10 Units into the skin 3 (three) times daily. As per sliding scale instructions 01/16/16  Yes [name], MD  Insulin Degludec (TRESIBA FLEXTOUCH) 200 UNIT/ML SOPN Inject 100 Units into the skin daily before breakfast.    Yes [provider]  levalbuterol (XOPENEX HFA) 45 MCG/ACT inhaler Inhale 1-2 puffs into the lungs every 8 (eight) hours as needed for wheezing. 04/03/18  Yes Causey, Charlestine Massed, NP  levothyroxine (SYNTHROID) 150 MCG tablet TAKE 1 TABLET BY MOUTH EVERY DAY BEFORE BREAKFAST Patient taking differently: Take 150 mcg by mouth daily before breakfast.  10/26/18  Yes [name], MD  lidocaine-prilocaine (EMLA) cream Apply 1 application topically as needed. Patient taking differently: Apply 1 application topically as needed (port access).  08/23/18  Yes [name], MD  LORazepam (ATIVAN) 0.5 MG tablet Take 1  tablet (0.5 mg total) by mouth every 6 (six) hours as needed (Nausea or vomiting). 10/25/18  Yes [name], MD  magnesium oxide (MAG-OX) 400 (241.3 Mg) MG tablet Take 1 tablet (400 mg total) by mouth 2 (two) times daily. 10/23/18  Yes [name], MD  metoprolol succinate (TOPROL-XL) 50 MG 24 hr tablet Take 1 tablet (37m) by mouth in the morning and 1/2 tablet (237m in the evening. Take with or immediately following a meal. Patient taking differently: Take 25-50 mg by mouth 2 (two) times daily. Take 5052my mouth in the morning and 39m61m the evening. Take with or immediately following a meal. 08/10/18  Yes Meng, Hao,Castine  ondansetron (ZOFRAN) 8 MG tablet Take 1 tablet (8 mg total) by mouth 2 (two) times daily as needed for refractory nausea / vomiting. Start on day 3 after chemo. 09/20/18  Yes KaleIrene Limbo  Cloria Spring, MD  oxyCODONE (OXY IR/ROXICODONE) 5 MG immediate release tablet Take 1 tablet (5 mg total) by mouth every 4 (four) hours as needed for severe pain. 11/17/18  Yes Donne Hazel, MD  Vitamins A & D (VITAMIN A & D) OINT Apply 1 application topically 2 (two) times daily. Patient taking differently: Apply 1 application topically 2 (two) times daily as needed (itching).  03/06/18  Yes [name], MD  liver oil-zinc oxide (DESITIN) 40 % ointment Apply topically 4 (four) times daily -  with meals and at bedtime. Apply to coccyx/gluteal fold wound Patient not taking: Reported on 12/11/2018 11/17/18   Donne Hazel, MD  mupirocin ointment (BACTROBAN) 2 % Apply topically 2 (two) times daily. Apply into wound bed on right inner upper thigh.  Cover with a small foam dressing. Change the foam daily and prn Patient not taking: Reported on 12/11/2018 11/17/18   Donne Hazel, MD  naproxen (NAPROSYN) 500 MG tablet Take 1 tablet (500 mg total) by mouth 2 (two) times daily with a meal. Patient not taking: Reported on 08/10/2018 06/21/17   Varney Biles, MD  nitrofurantoin,  macrocrystal-monohydrate, (MACROBID) 100 MG capsule Take 1 capsule (100 mg total) by mouth every 12 (twelve) hours. Patient not taking: Reported on 08/10/2018 04/30/18   Cristal Deer, MD  senna-docusate (SENNA S) 8.6-50 MG tablet Take 2 tablets by mouth 2 (two) times daily. May reduce to 2 tab po HS as needed once regular BM estabished Patient not taking: Reported on 11/16/2018 05/24/16   [name], MD  traZODone (DESYREL) 50 MG tablet Take 1-2 tablets (50-100 mg total) by mouth at bedtime as needed for sleep. Patient not taking: Reported on 12/11/2018 05/16/17   [name], MD    Physical Exam: Constitutional: Moderately built and nourished. Vitals:   12/10/18 1845 12/10/18 2108 12/10/18 2130 12/10/18 2344  BP: (!) 160/80 (!) 161/95 (!) 172/85   Pulse: (!) 131 (!) 131 (!) 130   Resp: (!) 26 (!) 26 (!) 24   Temp:      TempSrc:      SpO2: 100% 100% 100%   Weight:    118.8 kg  Height:    _0  (1.753 m)   Eyes: Anicteric no pallor. ENMT: No discharge from the ears eyes nose or mouth. Neck: No mass felt.  No neck rigidity. Respiratory: No rhonchi or crepitations. Cardiovascular: S1-S2 heard. Abdomen: Soft nontender bowel sounds present. Musculoskeletal: No edema. Skin: No rash. Neurologic: Alert awake oriented to time place and person.  Moves all extremities. Psychiatric: Appears normal.   Labs on Admission: I have personally reviewed following labs and imaging studies  CBC: Recent Labs  Lab 12/06/18 0818 12/10/18 1842  WBC 9.8 8.5  NEUTROABS 8.0*  --   HGB 9.5* 8.3*  HCT 29.6* 25.8*  MCV 96.4 98.5  PLT 274 976   Basic Metabolic Panel: Recent Labs  Lab 12/06/18 0818 12/10/18 1842  NA 139 137  K 4.3 3.8  CL 106 106  CO2 22 22  GLUCOSE 207* 174*  BUN 10 10  CREATININE 0.91 0.84  CALCIUM 8.4* 8.4*  MG 1.2*  --    GFR: Estimated Creatinine Clearance: 109.1 mL/min (by C-G formula based on SCr of 0.84 mg/dL). Liver Function Tests: Recent Labs    Lab 12/06/18 0818  AST 13*  ALT 9  ALKPHOS 94  BILITOT 0.3  PROT 7.8  ALBUMIN 3.0*   No results for input(s): LIPASE, AMYLASE in  the last 168 hours. No results for input(s): AMMONIA in the last 168 hours. Coagulation Profile: No results for input(s): INR, PROTIME in the last 168 hours. Cardiac Enzymes: No results for input(s): CKTOTAL, CKMB, CKMBINDEX, TROPONINI in the last 168 hours. BNP (last 3 results) No results for input(s): PROBNP in the last 8760 hours. HbA1C: No results for input(s): HGBA1C in the last 72 hours. CBG: Recent Labs  Lab 12/11/18 0207  GLUCAP 209*   Lipid Profile: No results for input(s): CHOL, HDL, LDLCALC, TRIG, CHOLHDL, LDLDIRECT in the last 72 hours. Thyroid Function Tests: No results for input(s): TSH, T4TOTAL, FREET4, T3FREE, THYROIDAB in the last 72 hours. Anemia Panel: No results for input(s): VITAMINB12, FOLATE, FERRITIN, TIBC, IRON, RETICCTPCT in the last 72 hours. Urine analysis:    Component Value Date/Time   COLORURINE YELLOW 04/29/2018 0419   APPEARANCEUR HAZY (A) 04/29/2018 0419   LABSPEC 1.025 04/29/2018 0419   PHURINE 5.0 04/29/2018 0419   GLUCOSEU 50 (A) 04/29/2018 0419   HGBUR MODERATE (A) 04/29/2018 0419   BILIRUBINUR NEGATIVE 04/29/2018 0419   KETONESUR NEGATIVE 04/29/2018 0419   PROTEINUR <30 10/11/2018 1510   UROBILINOGEN 0.2 02/01/2013 2022   NITRITE POSITIVE (A) 04/29/2018 0419   LEUKOCYTESUR MODERATE (A) 04/29/2018 0419   Sepsis Labs: _0 (procalcitonin:4,lacticidven:4) ) Recent Results (from the past 240 hour(s))  SARS Coronavirus 2 [employer] order, Performed in [facility] hospital lab) Nasopharyngeal Nasopharyngeal Swab     Status: None   Collection Time: 12/11/18 12:07 AM   Specimen: Nasopharyngeal Swab  Result Value Ref Range Status   SARS Coronavirus 2 NEGATIVE NEGATIVE Final    Comment: (NOTE) If result is NEGATIVE SARS-CoV-2 target nucleic acids are NOT DETECTED. The SARS-CoV-2 RNA is generally  detectable in upper and lower  respiratory specimens during the acute phase of infection. The lowest  concentration of SARS-CoV-2 viral copies this assay can detect is 250  copies / mL. A negative result does not preclude SARS-CoV-2 infection  and should not be used as the sole basis for treatment or other  patient management decisions.  A negative result may occur with  improper specimen collection / handling, submission of specimen other  than nasopharyngeal swab, presence of viral mutation(s) within the  areas targeted by this assay, and inadequate number of viral copies  (<250 copies / mL). A negative result must be combined with clinical  observations, patient history, and epidemiological information. If result is POSITIVE SARS-CoV-2 target nucleic acids are DETECTED. The SARS-CoV-2 RNA is generally detectable in upper and lower  respiratory specimens dur ing the acute phase of infection.  Positive  results are indicative of active infection with SARS-CoV-2.  Clinical  correlation with patient history and other diagnostic information is  necessary to determine patient infection status.  Positive results do  not rule out bacterial infection or co-infection with other viruses. If result is PRESUMPTIVE POSTIVE SARS-CoV-2 nucleic acids MAY BE PRESENT.   A presumptive positive result was obtained on the submitted specimen  and confirmed on repeat testing.  While 2019 novel coronavirus  (SARS-CoV-2) nucleic acids may be present in the submitted sample  additional confirmatory testing may be necessary for epidemiological  and / or clinical management purposes  to differentiate between  SARS-CoV-2 and other Sarbecovirus currently known to infect humans.  If clinically indicated additional testing with an alternate test  methodology [id number]) is advised. The SARS-CoV-2 RNA is generally  detectable in upper and lower respiratory sp ecimens during the acute  phase of infection.  The  expected result is Negative. Fact Sheet for Patients:  StrictlyIdeas.no Fact Sheet for Healthcare Providers: BankingDealers.co.za This test is not yet approved or cleared by the [place] FDA and has been authorized for detection and/or diagnosis of SARS-CoV-2 by FDA under an Emergency Use Authorization (EUA).  This EUA will remain in effect (meaning this test can be used) for the duration of the COVID-19 declaration under Section 564(b)(1) of the Act, 21 U.S.C. section 360bbb-3(b)(1), unless the authorization is terminated or revoked sooner. Performed at [facility], [place] [address] [place], [place] [address]      Radiological Exams on Admission: Ct Angio Chest Pe W And/or Wo Contrast  Result Date: 12/10/2018 CLINICAL DATA:  Shortness of breath and coughing EXAM: CT ANGIOGRAPHY CHEST WITH CONTRAST TECHNIQUE: Multidetector CT imaging of the chest was performed using the standard protocol during bolus administration of intravenous contrast. Multiplanar CT image reconstructions and MIPs were obtained to evaluate the vascular anatomy. CONTRAST:  165m OMNIPAQUE IOHEXOL 350 MG/ML SOLN COMPARISON:  11/29/2018 FINDINGS: The study is somewhat limited due to patient motion. Cardiovascular: There is a optimal opacification of the pulmonary arteries. There is no central,segmental, or subsegmental filling defects within the pulmonary arteries. There is mild cardiomegaly. No evidence of right ventricular heart strain. There is normal three-vessel brachiocephalic anatomy without proximal stenosis. The thoracic aorta is normal in appearance. Mediastinum/Nodes: There is soft tissue thickening seen at along the right paratracheal stripe as on the prior exam. There is marked right axillary adenopathy with areas of central necrosis and surrounding fat stranding changes as on the prior exam. Lungs/Pleura: There is interval large min of a moderate  to large right pleural effusion. Postradiation fibrotic changes are seen in the medial right upper lung. There is hazy bibasilar atelectasis. Upper Abdomen: No acute abnormalities present in the visualized portions of the upper abdomen. Musculoskeletal: No chest wall abnormality. No acute or significant osseous findings. Review of the MIP images confirms the above findings. IMPRESSION: 1. No pulmonary embolism. 2. Interval enlargement of moderate to large right pleural effusion 3. Post radiation fibrotic changes seen in the medial right upper lung. 4. Right axillary adenopathy with surrounding inflammatory changes and central necrosis. Electronically Signed   By: BPrudencio PairM.D.   On: 12/10/2018 23:16   Dg Chest Port 1 View  Result Date: 12/10/2018 CLINICAL DATA:  Shortness of breath. EXAM: PORTABLE CHEST 1 VIEW COMPARISON:  November 16, 2018 FINDINGS: Right injectable port in stable position. The cardiac silhouette is normal. Clear left lung. Increased right pleural effusion with volume loss in the right hemithorax. Stable right perihilar mass. Osseous structures are without acute abnormality. Soft tissues are grossly normal. IMPRESSION: 1. Increased right pleural effusion with volume loss in the right hemithorax. 2. Stable right perihilar mass. Electronically Signed   By: DFidela SalisburyM.D.   On: 12/10/2018 19:16    EKG: Independently reviewed.  Sinus tachycardia.  Assessment/Plan Principal Problem:   Acute respiratory failure with hypoxia (HCC) Active Problems:   Diabetes mellitus without complication ([place]   Primary cancer of right lung metastatic to other site ([facility]   Non-ischemic cardiomyopathy (HCC)   Pleural effusion    1. Acute respiratory failure with hypoxia secondary to worsening right-sided pleural effusion with known history of metastatic adenocarcinoma of the lung being followed by oncologist receives chemoradiation -will order for ultrasound-guided thoracentesis.  Closely  monitor in telemetry. 2. Diabetes mellitus type 2 on Tresiba with sliding scale coverage. 3. Sinus tachycardia on  metoprolol which will be continued. 4. 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 5. Hypothyroidism on Synthroid.  Check TSH. 6. History of nonischemic cardiomyopathy -shortness of breath more likely from pleural effusion from metastatic adenocarcinoma. 7. History of depression on Cymbalta.   DVT prophylaxis: SCDs in anticipation of procedure. Code Status: Full code. Family Communication: Discussed with patient. Disposition Plan: Home. Consults called: None. Admission status: Observation.   Rise Patience MD [facility] Pager [phone].  If 7PM-7AM, please contact night-coverage [email or web address] Password [email or web address]  12/11/2018, 2:15 AM

## 2018-12-12 ENCOUNTER — Encounter: Payer: Self-pay | Admitting: Hematology

## 2018-12-12 ENCOUNTER — Telehealth: Payer: Self-pay | Admitting: *Deleted

## 2018-12-12 ENCOUNTER — Inpatient Hospital Stay (HOSPITAL_COMMUNITY): Payer: Medicare Other

## 2018-12-12 LAB — GLUCOSE, CAPILLARY
Glucose-Capillary: 95 mg/dL (ref 70–99)
Glucose-Capillary: 115 mg/dL — ABNORMAL HIGH (ref 70–99)

## 2018-12-12 MED ORDER — HEPARIN SOD (PORK) LOCK FLUSH 100 UNIT/ML IV SOLN
500.0000 [IU] | INTRAVENOUS | Status: AC | PRN
  Administered 2018-12-12: 500 [IU]

## 2018-12-12 MED ORDER — ENSURE ENLIVE PO LIQD: 237.0000 mL | Freq: Two times a day (BID) | ORAL | 0 refills | Status: AC

## 2018-12-12 NOTE — Discharge Instructions (Signed)
Marilyn [name],  You were in the hospital with trouble breathing because of accumulation of fluid around your lung. This is related to your cancer. Please discuss possibly getting a catheter placed into your chest for recurrent episodes; you can speak with your oncologist about this option.

## 2018-12-12 NOTE — Plan of Care (Signed)
Pt transferred to/from [facility] and became SOB. Pt's appetite is poor. Her last BM was Tuesday Sept. 1st. Pt has a flat affect.

## 2018-12-12 NOTE — TOC Progression Note (Signed)
Transition of Care [facility]) - Progression Note    Patient Details  Name: Marilyn [name] MRN: [id number] Date of Birth: 1965/05/30  Transition of [facility]) CM/SW Contact  Purcell Mouton, RN Phone Number: 12/12/2018, 2:32 PM  Clinical Narrative:    Pt selected [facility] for HHPT. Referral given to in house rep.        Expected Discharge Plan and Services           Expected Discharge Date: 12/12/18                                     Social Determinants of Health (SDOH) Interventions    Readmission Risk Interventions No flowsheet data found.

## 2018-12-12 NOTE — Discharge Summary (Signed)
Physician Discharge Summary  Marilyn [name] Gravette GNF:[id number] DOB: 25-Aug-1965 DOA: 12/10/2018  PCP: Marilyn Drafts, FNP  Admit date: 12/10/2018 Discharge date: 12/12/2018  Admitted From: Home Disposition: Home  Recommendations for Outpatient Follow-up:  1. Follow up with PCP in 1 week 2. Follow up with oncology; consider Pleurx catheter 3. Please obtain BMP/CBC in one week 4. Please follow up on the following pending results: Pleural fluid studies  Home Health: PT Equipment/Devices: None  Discharge Condition: Stable CODE STATUS: Full code Diet recommendation: Heart healthy   Brief/Interim Summary:  Admission HPI written by Rise Patience, MD   Chief Complaint: Shortness of breath.  HPI: Marilyn [name] is a 53 y.o. female with history of metastatic adenocarcinoma followed by Dr. Irene [name] oncologist presents to the ER with worsening shortness of breath over the last 24 hours with right-sided body taper chest pain.  Denies any fever chills productive cough.  Patient states her shortness of breath was worse than what she was when she was admitted last month for pleural effusion.  Patient shortness of breath is present even at rest.  ED Course: In the ER patient is tachycardic.  CT angiogram of the chest shows worsening right-sided pleural effusion moderately large size.  Patient is tachycardic and has known history of sinus tachycardia for which patient takes metoprolol.  Patient was afebrile with COVID-19 test negative.  Labs revealed anemia which is chronic with creatinine 1.8 BNP normal troponin negative.  Patient admitted for acute respiratory failure secondary to worsening right-sided pleural effusion with known history of metastatic adenocarcinoma of the lung.    Hospital course:  Acute respiratory failure with hypoxia Secondary to pleural effusion. Patient known to have malignant pleural effusion. She underwent thoracentesis with resolution of hypoxia. Recommend  consideration of Pleurx catheter in the future.  Malignant pleural effusion Management above.  Sinus tachycardia Patient is on metoprolol as an outpatient. Continued while inpatient. May need to be adjusted if continues to be uncontrolled.  Discharge Diagnoses:  Principal Problem:   Acute respiratory failure with hypoxia ([place]) Active Problems:   Diabetes mellitus without complication ([place])   Primary cancer of right lung metastatic to other site [facility])   Non-ischemic cardiomyopathy ([place])   Pleural effusion    Discharge Instructions  Discharge Instructions    Call MD for:  difficulty breathing, headache or visual disturbances   Complete by: As directed    Call MD for:  extreme fatigue   Complete by: As directed    Call MD for:  temperature >100.4   Complete by: As directed    Diet - low sodium heart healthy   Complete by: As directed    Increase activity slowly   Complete by: As directed      Allergies as of 12/12/2018   No Known Allergies     Medication List    TAKE these medications   Accu-Chek Aviva Plus test strip Generic drug: glucose blood 1 each by Other route 3 (three) times daily.   Aspirin Low Dose 81 MG EC tablet Generic drug: aspirin TAKE 1 TABLET BY MOUTH EVERY DAY What changed:   how much to take  additional instructions   atorvastatin 40 MG tablet Commonly known as: LIPITOR Take 1 tablet (40 mg total) by mouth daily at 6 PM.   dexamethasone 4 MG tablet Commonly known as: DECADRON Take 1 tab two times a day the day before Alimta chemo. Take 2 tabs two times a day starting the day after  chemo for 3 days. What changed:   how much to take  how to take this  when to take this  additional instructions   diclofenac sodium 1 % Gel Commonly known as: VOLTAREN Apply 2 g topically 2 (two) times daily as needed (for chestwall pain from costochondiritis).   DULoxetine 60 MG capsule Commonly known as: CYMBALTA TAKE 1 CAPSULE BY MOUTH EVERY  DAY What changed: how much to take   feeding supplement (ENSURE ENLIVE) Liqd Take 237 mLs by mouth 2 (two) times daily between meals.   folic acid 1 MG tablet Commonly known as: FOLVITE Take 1 tablet (1 mg total) by mouth daily. Start 5-7 days before Alimta chemotherapy. Continue until 21 days after Alimta completed.   glucose monitoring kit monitoring kit 1 each by Does not apply route as needed for other.   hydrOXYzine 10 MG tablet Commonly known as: ATARAX/VISTARIL Take 1 tablet (10 mg total) by mouth 3 (three) times daily as needed for itching.   insulin aspart 100 UNIT/ML FlexPen Commonly known as: NOVOLOG As per sliding scale instructions What changed:   how much to take  how to take this  when to take this   levalbuterol 45 MCG/ACT inhaler Commonly known as: XOPENEX HFA Inhale 1-2 puffs into the lungs every 8 (eight) hours as needed for wheezing.   levothyroxine 150 MCG tablet Commonly known as: SYNTHROID TAKE 1 TABLET BY MOUTH EVERY DAY BEFORE BREAKFAST What changed:   how much to take  how to take this  when to take this  additional instructions   lidocaine-prilocaine cream Commonly known as: EMLA Apply 1 application topically as needed. What changed: reasons to take this   liver oil-zinc oxide 40 % ointment Commonly known as: DESITIN Apply topically 4 (four) times daily -  with meals and at bedtime. Apply to coccyx/gluteal fold wound   LORazepam 0.5 MG tablet Commonly known as: ATIVAN Take 1 tablet (0.5 mg total) by mouth every 6 (six) hours as needed (Nausea or vomiting).   magnesium oxide 400 (241.3 Mg) MG tablet Commonly known as: MAG-OX Take 1 tablet (400 mg total) by mouth 2 (two) times daily.   metoprolol succinate 50 MG 24 hr tablet Commonly known as: TOPROL-XL Take 1 tablet (81m) by mouth in the morning and 1/2 tablet (274m in the evening. Take with or immediately following a meal. What changed:   how much to take  how to take  this  when to take this  additional instructions   mupirocin ointment 2 % Commonly known as: BACTROBAN Apply topically 2 (two) times daily. Apply into wound bed on right inner upper thigh.  Cover with a small foam dressing. Change the foam daily and prn   naproxen 500 MG tablet Commonly known as: Naprosyn Take 1 tablet (500 mg total) by mouth 2 (two) times daily with a meal.   nitrofurantoin (macrocrystal-monohydrate) 100 MG capsule Commonly known as: MACROBID Take 1 capsule (100 mg total) by mouth every 12 (twelve) hours.   ondansetron 8 MG tablet Commonly known as: Zofran Take 1 tablet (8 mg total) by mouth 2 (two) times daily as needed for refractory nausea / vomiting. Start on day 3 after chemo.   oxyCODONE 5 MG immediate release tablet Commonly known as: Oxy IR/ROXICODONE Take 1 tablet (5 mg total) by mouth every 4 (four) hours as needed for severe pain.   pantoprazole 40 MG tablet Commonly known as: Protonix Take 1 tablet (40 mg total) by mouth daily.  senna-docusate 8.6-50 MG tablet Commonly known as: Senna S Take 2 tablets by mouth at bedtime.   traZODone 50 MG tablet Commonly known as: DESYREL Take 1-2 tablets (50-100 mg total) by mouth at bedtime as needed for sleep.   Tyler Aas FlexTouch 200 UNIT/ML Sopn Generic drug: Insulin Degludec Inject 100 Units into the skin daily before breakfast.   Vitamin A & D Oint Apply 1 application topically 2 (two) times daily. What changed:   when to take this  reasons to take this      Follow-up Information    Marilyn Drafts, FNP. Schedule an appointment as soon as possible for a visit in 1 week(s).   Specialty: Nurse Practitioner Contact information: [place] [address] [phone]        [name], MD. Schedule an appointment as soon as possible for a visit in 1 week(s).   Specialties: Hematology, Oncology Why: Hospital follow-up/pleural effusion Contact  information: [place] [address] [phone]          No Known Allergies  Consultations:  None   Procedures/Studies: Dg Chest 1 View  Result Date: 12/11/2018 CLINICAL DATA:  Post right-sided thoracentesis. EXAM: CHEST  1 VIEW COMPARISON:  Chest radiograph-12/10/2018; chest CT-12/10/2018 FINDINGS: Grossly unchanged cardiac silhouette and mediastinal contours with obscuration of the right hilum secondary to consolidative opacities, volume loss and architectural distortion as demonstrated on preceding chest CT. No change to slight reduction of right-sided pleural effusion post thoracentesis. No pneumothorax. The left hemithorax remains well aerated. No new focal airspace opacities. No evidence of edema. Stable position of support apparatus. No acute osseous abnormalities. IMPRESSION: 1. No change to slight reduction of right-sided pleural effusion post thoracentesis. No pneumothorax. 2. Persistent obscuration of the right hilum secondary to consolidative opacities, volume loss and architectural distortion. Electronically Signed   By: Sandi Mariscal M.D.   On: 12/11/2018 11:33   Dg Chest 1 View  Result Date: 11/16/2018 CLINICAL DATA:  Post right thoracentesis. History of stage IV lung cancer. EXAM: CHEST  1 VIEW COMPARISON:  09/15/2018 and chest CT 11/16/2018. FINDINGS: Volume loss in the right hemithorax. Hazy densities at the right lung base may represent atelectasis. Persistent densities in the right hilar region compatible with areas of consolidation or volume loss. Right hilar densities are unchanged. Negative for right pneumothorax. Lucency lateral to the right hilar soft tissue is similar to the prior chest radiograph. Left lung is clear. Heart and mediastinum are stable. IMPRESSION: 1. Negative for pneumothorax following the right thoracentesis. 2. Persistent volume loss in the right hemithorax with stable densities in the right hilar region. Electronically Signed    By: Markus Daft M.D.   On: 11/16/2018 13:32   Ct Chest W Contrast  Result Date: 11/29/2018 CLINICAL DATA:  53 year old female with history of stage IV lung cancer undergoing ongoing chemotherapy. Mid chest pain for the past 3 weeks. Cough and shortness of breath. Follow-up study. EXAM: CT CHEST, ABDOMEN, AND PELVIS WITH CONTRAST TECHNIQUE: Multidetector CT imaging of the chest, abdomen and pelvis was performed following the standard protocol during bolus administration of intravenous contrast. CONTRAST:  127m OMNIPAQUE IOHEXOL 300 MG/ML  SOLN COMPARISON:  Chest CT 11/16/2018. CT the abdomen and pelvis 05/30/2018. Multiple other priors. FINDINGS: CT CHEST FINDINGS Cardiovascular: Heart size is normal. There is no significant pericardial fluid, thickening or pericardial calcification. There is aortic atherosclerosis, as well as atherosclerosis of the great vessels of the mediastinum and the coronary  arteries, including calcified atherosclerotic plaque in the left anterior descending, left circumflex and right coronary arteries. Mediastinum/Nodes: Low right paratracheal lymph node measuring 1.7 cm in short axis. Esophagus is unremarkable in appearance. Enlarged right axillary lymph node measuring 2.7 cm in short axis, similar to the prior study. Other smaller right axillary lymph nodes are also noted. Lungs/Pleura: Chronic mass-like areas of architectural distortion in the perihilar aspect of the right upper lobe and superior segment of the right lower lobe, similar to the prior study, most compatible with chronic postradiation mass-like fibrosis. Moderate right pleural effusion lying dependently. No acute consolidative airspace disease. No pleural effusions. No suspicious appearing pulmonary nodules or masses are noted. Musculoskeletal: There are no aggressive appearing lytic or blastic lesions noted in the visualized portions of the skeleton. CT ABDOMEN PELVIS FINDINGS Hepatobiliary: Ill-defined low-attenuation  area in segment 4B of the liver adjacent to the falciform ligament, incompletely characterize, but similar to focal low-attenuation seen on prior study 05/30/2018, most compatible with focal fatty infiltration. No other suspicious cystic or solid hepatic lesions. No intra or extrahepatic biliary ductal dilatation. Gallbladder is contracted around several noncalcified gallstones. No surrounding pericholecystic fluid or inflammatory changes to suggest an acute cholecystitis at this time. Pancreas: No pancreatic mass. No pancreatic ductal dilatation. No pancreatic or peripancreatic fluid collections or inflammatory changes. Spleen: Unremarkable. Adrenals/Urinary Tract: Bilateral kidneys and adrenal glands are normal in appearance. No hydroureteronephrosis. Urinary bladder is normal in appearance. Stomach/Bowel: Normal appearance of the stomach. No pathologic dilatation of small bowel or colon. The appendix is not confidently identified and may be surgically absent. Regardless, there are no inflammatory changes noted adjacent to the cecum to suggest the presence of an acute appendicitis at this time. Vascular/Lymphatic: Aortic atherosclerosis, without evidence of aneurysm or dissection in the abdominal or pelvic vasculature. Multiple prominent borderline enlarged and mildly enlarged retroperitoneal and pelvic lymph nodes. The largest retroperitoneal lymph nodes measure up to 1 cm in short axis in the aortocaval nodal station (axial image 75 of series 2). The largest pelvic lymph nodes are in the right internal iliac nodal chain measuring up to 1.5 cm in short axis on axial image 103 of series 2 and 109 of series 2. 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 Ovaries are unremarkable in appearance. Other: No significant volume of ascites.  No pneumoperitoneum. Musculoskeletal: There are no aggressive appearing lytic or blastic lesions noted in the visualized  portions of the skeleton. IMPRESSION: 1. Persistent low right paratracheal lymphadenopathy, similar to the prior study. This may reflect residual disease. Stable area of presumed postradiation mass-like fibrosis in the perihilar aspect of the right upper lung. 2. Moderate right pleural effusion is similar to the prior examination. 3. Lymphadenopathy also noted in the right axillary region, right hemipelvis and retroperitoneum, as above, potentially reflective of metastatic disease. 4. Focal low-attenuation region in segment 4B of the liver, similar to prior noncontrast CT examination of the abdomen, likely to represent focal fatty infiltration. 5. Cholelithiasis without evidence of acute cholecystitis at this time. 6. Aortic atherosclerosis, in addition to 3 vessel coronary artery disease. Please note that although the presence of coronary artery calcium documents the presence of coronary artery disease, the severity of this disease and any potential stenosis cannot be assessed on this non-gated CT examination. Assessment for potential risk factor modification, dietary therapy or pharmacologic therapy may be warranted, if clinically indicated. 7. Additional incidental findings, as above. Electronically Signed   By: Vinnie Langton  M.D.   On: 11/29/2018 14:09   Ct Angio Chest Pe W And/or Wo Contrast  Result Date: 12/10/2018 CLINICAL DATA:  Shortness of breath and coughing EXAM: CT ANGIOGRAPHY CHEST WITH CONTRAST TECHNIQUE: Multidetector CT imaging of the chest was performed using the standard protocol during bolus administration of intravenous contrast. Multiplanar CT image reconstructions and MIPs were obtained to evaluate the vascular anatomy. CONTRAST:  160m OMNIPAQUE IOHEXOL 350 MG/ML SOLN COMPARISON:  11/29/2018 FINDINGS: The study is somewhat limited due to patient motion. Cardiovascular: There is a optimal opacification of the pulmonary arteries. There is no central,segmental, or subsegmental filling  defects within the pulmonary arteries. There is mild cardiomegaly. No evidence of right ventricular heart strain. There is normal three-vessel brachiocephalic anatomy without proximal stenosis. The thoracic aorta is normal in appearance. Mediastinum/Nodes: There is soft tissue thickening seen at along the right paratracheal stripe as on the prior exam. There is marked right axillary adenopathy with areas of central necrosis and surrounding fat stranding changes as on the prior exam. Lungs/Pleura: There is interval large min of a moderate to large right pleural effusion. Postradiation fibrotic changes are seen in the medial right upper lung. There is hazy bibasilar atelectasis. Upper Abdomen: No acute abnormalities present in the visualized portions of the upper abdomen. Musculoskeletal: No chest wall abnormality. No acute or significant osseous findings. Review of the MIP images confirms the above findings. IMPRESSION: 1. No pulmonary embolism. 2. Interval enlargement of moderate to large right pleural effusion 3. Post radiation fibrotic changes seen in the medial right upper lung. 4. Right axillary adenopathy with surrounding inflammatory changes and central necrosis. Electronically Signed   By: BPrudencio PairM.D.   On: 12/10/2018 23:16   Ct Angio Chest Pe W And/or Wo Contrast  Result Date: 11/16/2018 CLINICAL DATA:  Stage IV lung carcinoma. Chest pain and shortness of breath EXAM: CT ANGIOGRAPHY CHEST WITH CONTRAST TECHNIQUE: Multidetector CT imaging of the chest was performed using the standard protocol during bolus administration of intravenous contrast. Multiplanar CT image reconstructions and MIPs were obtained to evaluate the vascular anatomy. CONTRAST:  1036mOMNIPAQUE IOHEXOL 350 MG/ML SOLN COMPARISON:  September 14, 2018 FINDINGS: Cardiovascular: There is no demonstrable pulmonary embolus. There is no thoracic aortic aneurysm or dissection. Visualized great vessels appear unremarkable. There is no  pericardial effusion or pericardial thickening. Port-A-Cath tip is in the superior vena cava. Mediastinum/Nodes: Visualized thyroid appears unremarkable. There remains axillary adenopathy on the right. The largest lymph node in the right axillary region measures 3.4 x 2.5 cm, similar to prior study. There is left supraclavicular lymph node enlargement medially with focal lymph node in this area measuring 2.3 x 1.9 cm, essentially stable. Right paratracheal adenopathy is also present with index lymph node in this area measuring 2.0 x 1.7 cm, essentially stable. No new adenopathy evident. No esophageal lesions appreciable. Lungs/Pleura: There is a sizable free-flowing pleural effusion on the right. The right perihilar region post radiation therapy change is stable. There is atelectatic change in this area as well. The contour of this area is stable compared to most recent CT. No well-defined mass seen in this area. Left lung is clear. Upper Abdomen: Visualized upper abdominal structures appear unremarkable. Musculoskeletal: No blastic or lytic bone lesions. Diffuse skin thickening in the right breast region is stable. Review of the MIP images confirms the above findings. IMPRESSION: 1. No demonstrable pulmonary embolus. No thoracic aortic aneurysm or dissection. 2. Sizable free-flowing pleural effusion on the right. Post radiation therapy change in  the right perihilar region is stable. There is atelectasis in this area but no well-defined mass. Subtle neoplasm in this area is difficult to exclude, although the appearance is stable compared to most recent CT. 3. Stable adenopathy in the right axillary, left supraclavicular, and right paratracheal regions. 4.  Persistent skin thickening right breast. Electronically Signed   By: Lowella Grip III M.D.   On: 11/16/2018 08:18   Nm Bone Scan Whole Body  Result Date: 11/29/2018 CLINICAL DATA:  Non-small cell lung cancer EXAM: NUCLEAR MEDICINE WHOLE BODY BONE SCAN  TECHNIQUE: Whole body anterior and posterior images were obtained approximately 3 hours after intravenous injection of radiopharmaceutical. RADIOPHARMACEUTICALS:  21 mCi Technetium-10mMDP IV COMPARISON:  Correlation: PET-CT 08/03/2017, CT chest abdomen pelvis 11/29/2018 FINDINGS: Decreased tracer localization is identified within upper thoracic spine and in upper LEFT ribs. Retained tracer within RIGHT jugular Port-A-Cath reservoir and tubing. Questionable foci of increased tracer accumulation at the calvarium especially posterior RIGHT temporal region and LEFT parietal region, nonspecific but cannot exclude osseous metastases. Nonspecific uptake of tracer at the distal sternum/xiphoid process, no abnormality seen on CT. No additional worrisome sites of osseous tracer accumulation are identified. Asymmetric tracer retention within the RIGHT renal collecting system versus LEFT though no hydronephrosis is identified on CT. Diffuse uptake of tracer in the RIGHT hemithorax corresponding to pleural effusion on CT. Remaining urinary tract and soft tissue distribution of tracer is unremarkable. IMPRESSION: Questionable foci of increased tracer accumulation in the calvaria and at the distal sternum/xiphoid process, uncertain etiology, cannot exclude osseous metastatic disease No other worrisome sites of osseous tracer accumulation are identified. Decreased tracer uptake at the thoracic spine and at upper LEFT ribs, nonspecific, can be seen at radiation therapy ports recommend correlation with patient history. RIGHT pleural effusion. Electronically Signed   By: MLavonia DanaM.D.   On: 11/29/2018 16:33   Ct Abdomen Pelvis W Contrast  Result Date: 11/29/2018 CLINICAL DATA:  53year old female with history of stage IV lung cancer undergoing ongoing chemotherapy. Mid chest pain for the past 3 weeks. Cough and shortness of breath. Follow-up study. EXAM: CT CHEST, ABDOMEN, AND PELVIS WITH CONTRAST TECHNIQUE: Multidetector CT  imaging of the chest, abdomen and pelvis was performed following the standard protocol during bolus administration of intravenous contrast. CONTRAST:  1044mOMNIPAQUE IOHEXOL 300 MG/ML  SOLN COMPARISON:  Chest CT 11/16/2018. CT the abdomen and pelvis 05/30/2018. Multiple other priors. FINDINGS: CT CHEST FINDINGS Cardiovascular: Heart size is normal. There is no significant pericardial fluid, thickening or pericardial calcification. There is aortic atherosclerosis, as well as atherosclerosis of the great vessels of the mediastinum and the coronary arteries, including calcified atherosclerotic plaque in the left anterior descending, left circumflex and right coronary arteries. Mediastinum/Nodes: Low right paratracheal lymph node measuring 1.7 cm in short axis. Esophagus is unremarkable in appearance. Enlarged right axillary lymph node measuring 2.7 cm in short axis, similar to the prior study. Other smaller right axillary lymph nodes are also noted. Lungs/Pleura: Chronic mass-like areas of architectural distortion in the perihilar aspect of the right upper lobe and superior segment of the right lower lobe, similar to the prior study, most compatible with chronic postradiation mass-like fibrosis. Moderate right pleural effusion lying dependently. No acute consolidative airspace disease. No pleural effusions. No suspicious appearing pulmonary nodules or masses are noted. Musculoskeletal: There are no aggressive appearing lytic or blastic lesions noted in the visualized portions of the skeleton. CT ABDOMEN PELVIS FINDINGS Hepatobiliary: Ill-defined low-attenuation area in segment 4B of the  liver adjacent to the falciform ligament, incompletely characterize, but similar to focal low-attenuation seen on prior study 05/30/2018, most compatible with focal fatty infiltration. No other suspicious cystic or solid hepatic lesions. No intra or extrahepatic biliary ductal dilatation. Gallbladder is contracted around several  noncalcified gallstones. No surrounding pericholecystic fluid or inflammatory changes to suggest an acute cholecystitis at this time. Pancreas: No pancreatic mass. No pancreatic ductal dilatation. No pancreatic or peripancreatic fluid collections or inflammatory changes. Spleen: Unremarkable. Adrenals/Urinary Tract: Bilateral kidneys and adrenal glands are normal in appearance. No hydroureteronephrosis. Urinary bladder is normal in appearance. Stomach/Bowel: Normal appearance of the stomach. No pathologic dilatation of small bowel or colon. The appendix is not confidently identified and may be surgically absent. Regardless, there are no inflammatory changes noted adjacent to the cecum to suggest the presence of an acute appendicitis at this time. Vascular/Lymphatic: Aortic atherosclerosis, without evidence of aneurysm or dissection in the abdominal or pelvic vasculature. Multiple prominent borderline enlarged and mildly enlarged retroperitoneal and pelvic lymph nodes. The largest retroperitoneal lymph nodes measure up to 1 cm in short axis in the aortocaval nodal station (axial image 75 of series 2). The largest pelvic lymph nodes are in the right internal iliac nodal chain measuring up to 1.5 cm in short axis on axial image 103 of series 2 and 109 of series 2. 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 Ovaries are unremarkable in appearance. Other: No significant volume of ascites.  No pneumoperitoneum. Musculoskeletal: There are no aggressive appearing lytic or blastic lesions noted in the visualized portions of the skeleton. IMPRESSION: 1. Persistent low right paratracheal lymphadenopathy, similar to the prior study. This may reflect residual disease. Stable area of presumed postradiation mass-like fibrosis in the perihilar aspect of the right upper lung. 2. Moderate right pleural effusion is similar to the prior examination. 3. Lymphadenopathy also  noted in the right axillary region, right hemipelvis and retroperitoneum, as above, potentially reflective of metastatic disease. 4. Focal low-attenuation region in segment 4B of the liver, similar to prior noncontrast CT examination of the abdomen, likely to represent focal fatty infiltration. 5. Cholelithiasis without evidence of acute cholecystitis at this time. 6. Aortic atherosclerosis, in addition to 3 vessel coronary artery disease. Please note that although the presence of coronary artery calcium documents the presence of coronary artery disease, the severity of this disease and any potential stenosis cannot be assessed on this non-gated CT examination. Assessment for potential risk factor modification, dietary therapy or pharmacologic therapy may be warranted, if clinically indicated. 7. Additional incidental findings, as above. Electronically Signed   By: Vinnie Langton M.D.   On: 11/29/2018 14:09   Dg Chest Port 1 View  Result Date: 12/12/2018 CLINICAL DATA:  Respiratory failure, metastatic cancer EXAM: PORTABLE CHEST 1 VIEW COMPARISON:  12/11/2018 FINDINGS: No significant change in AP portable examination with layering moderate right pleural and post treatment appearance of the suprahilar right lung. No new airspace opacity. Right chest port catheter. IMPRESSION: No significant change in AP portable examination with layering moderate right pleural and post treatment appearance of the suprahilar right lung. No new airspace opacity. Electronically Signed   By: Eddie Candle M.D.   On: 12/12/2018 12:56   Dg Chest Port 1 View  Result Date: 12/10/2018 CLINICAL DATA:  Shortness of breath. EXAM: PORTABLE CHEST 1 VIEW COMPARISON:  November 16, 2018 FINDINGS: Right injectable port in stable position. The cardiac silhouette is normal. Clear left lung. Increased right pleural effusion  with volume loss in the right hemithorax. Stable right perihilar mass. Osseous structures are without acute abnormality. Soft  tissues are grossly normal. IMPRESSION: 1. Increased right pleural effusion with volume loss in the right hemithorax. 2. Stable right perihilar mass. Electronically Signed   By: Fidela Salisbury M.D.   On: 12/10/2018 19:16   Dg Chest Port 1 View  Result Date: 11/16/2018 CLINICAL DATA:  Chest pain, onset yesterday. Lung cancer with active chemotherapy. Thoracentesis earlier today. EXAM: PORTABLE CHEST 1 VIEW COMPARISON:  Radiographs and CT earlier this day. FINDINGS: Right chest port remains in place. Unchanged volume loss in the right hemithorax. No pneumothorax. Right pleural fluid better demonstrated on prior CT. Unchanged heart size and mediastinal contours with right hilar prominence, stable. Left lung is clear. IMPRESSION: No pneumothorax following right thoracentesis. Unchanged right hemithorax volume loss and hilar density. Electronically Signed   By: Keith Rake M.D.   On: 11/16/2018 18:55   [place] Rt Lower Extrem Ltd Soft Tissue Non Vascular  Result Date: 11/16/2018 CLINICAL DATA:  Open wound of the right inner thigh. Evaluate for deep abscess. EXAM: ULTRASOUND right LOWER EXTREMITY LIMITED TECHNIQUE: Ultrasound examination of the lower extremity soft tissues was performed in the area of clinical concern. COMPARISON:  None. FINDINGS: Scanning in the region of concern does not show any evidence of a deep fluid collection. Mild nonspecific edema of the subcutaneous tissues. IMPRESSION: No visible abscess. Electronically Signed   By: Nelson Chimes M.D.   On: 11/16/2018 19:18   US Thoracentesis Asp Pleural Space W/img Guide  Result Date: 12/11/2018 INDICATION: Symptomatic right sided pleural effusion EXAM: US THORACENTESIS ASP PLEURAL SPACE W/IMG GUIDE COMPARISON:  None. MEDICATIONS: 10 cc 1% lidocaine COMPLICATIONS: None immediate. TECHNIQUE: Informed written consent was obtained from the patient after a discussion of the risks, benefits and alternatives to treatment. A timeout was performed  prior to the initiation of the procedure. Initial ultrasound scanning demonstrates a right pleural effusion. The lower chest was prepped and draped in the usual sterile fashion. 1% lidocaine was used for local anesthesia. Under direct ultrasound guidance, a 19 gauge, 7-cm, Yueh catheter was introduced. An ultrasound image was saved for documentation purposes. The thoracentesis was performed. The catheter was removed and a dressing was applied. The patient tolerated the procedure well without immediate post procedural complication. The patient was escorted to have an upright chest radiograph. FINDINGS: A total of approximately 400 cc of yellow fluid was removed. Requested samples were sent to the laboratory. IMPRESSION: Successful ultrasound-guided right sided thoracentesis yielding 400 cc of pleural fluid. Read by Lavonia [name] [employer] Electronically Signed   By: Sandi Mariscal M.D.   On: 12/11/2018 11:09   US Thoracentesis Asp Pleural Space W/img Guide  Result Date: 11/16/2018 INDICATION: History of metastatic lung cancer, recurrent right pleural effusion, dyspnea. Request for diagnostic and therapeutic right thoracentesis. EXAM: ULTRASOUND GUIDED RIGHT THORACENTESIS MEDICATIONS: 1% lidocaine 10 mL COMPLICATIONS: None immediate. PROCEDURE: An ultrasound guided thoracentesis was thoroughly discussed with the patient and questions answered. The benefits, risks, alternatives and complications were also discussed. The patient understands and wishes to proceed with the procedure. Written consent was obtained. Ultrasound was performed to localize and mark an adequate pocket of fluid in the right chest. The area was then prepped and draped in the normal sterile fashion. 1% Lidocaine was used for local anesthesia. Under ultrasound guidance a 6 Fr Safe-T-Centesis catheter was introduced. Thoracentesis was performed. The catheter was removed and a dressing applied. FINDINGS: A total  of approximately 450 mL of hazy yellow  fluid was removed. Samples were sent to the laboratory as requested by the clinical team. IMPRESSION: Successful ultrasound guided right thoracentesis yielding 450 mL of pleural fluid. No pneumothorax on post-procedure chest x-ray. Read by: Gareth Eagle, PA-C Electronically Signed   By: Jacqulynn Cadet M.D.   On: 11/16/2018 14:11      Subjective: Patient without dyspnea.  Discharge Exam: Vitals:   12/11/18 2055 12/12/18 0512  BP: 125/78 121/78  Pulse: (!) 106 100  Resp: 18 18  Temp: 98.1 F (36.7 C) 98.7 F (37.1 C)  SpO2: 100% 98%   Vitals:   12/11/18 1108 12/11/18 1409 12/11/18 2055 12/12/18 0512  BP: (!) 155/78 138/82 125/78 121/78  Pulse:  (!) 108 (!) 106 100  Resp:  _0 Temp:  97.9 F (36.6 C) 98.1 F (36.7 C) 98.7 F (37.1 C)  TempSrc:  Oral Oral Oral  SpO2:  100% 100% 98%  Weight:      Height:        General: Pt is alert, awake, not in acute distress Cardiovascular: RRR, S1/S2 +, no rubs, no gallops Respiratory: CTA bilaterally, no wheezing, no rhonchi Abdominal: Soft, NT, ND, bowel sounds + Extremities: no edema, no cyanosis    The results of significant diagnostics from this hospitalization (including imaging, microbiology, ancillary and laboratory) are listed below for reference.     Microbiology: Recent Results (from the past 240 hour(s))  Blood culture (routine x 2)     Status: None (Preliminary result)   Collection Time: 12/10/18  6:45 PM   Specimen: BLOOD  Result Value Ref Range Status   Specimen Description   Final    BLOOD RIGHT ANTECUBITAL Performed at [facility] [address] [place], [place] [address]    Special Requests   Final    BOTTLES DRAWN AEROBIC AND ANAEROBIC Blood Culture adequate volume Performed at [facility] [address] [place], [place] [address]    Culture   Final    NO GROWTH 1 DAY Performed at [facility], [place] [address] [place], [place] [address]    Report  Status PENDING  Incomplete  SARS Coronavirus 2 [facility] order, Performed in [facility] hospital lab) Nasopharyngeal Nasopharyngeal Swab     Status: None   Collection Time: 12/11/18 12:07 AM   Specimen: Nasopharyngeal Swab  Result Value Ref Range Status   SARS Coronavirus 2 NEGATIVE NEGATIVE Final    Comment: (NOTE) If result is NEGATIVE SARS-CoV-2 target nucleic acids are NOT DETECTED. The SARS-CoV-2 RNA is generally detectable in upper and lower  respiratory specimens during the acute phase of infection. The lowest  concentration of SARS-CoV-2 viral copies this assay can detect is 250  copies / mL. A negative result does not preclude SARS-CoV-2 infection  and should not be used as the sole basis for treatment or other  patient management decisions.  A negative result may occur with  improper specimen collection / handling, submission of specimen other  than nasopharyngeal swab, presence of viral mutation(s) within the  areas targeted by this assay, and inadequate number of viral copies  (<250 copies / mL). A negative result must be combined with clinical  observations, patient history, and epidemiological information. If result is POSITIVE SARS-CoV-2 target nucleic acids are DETECTED. The SARS-CoV-2 RNA is generally detectable in upper and lower  respiratory specimens dur ing the acute phase of infection.  Positive  results are indicative  of active infection with SARS-CoV-2.  Clinical  correlation with patient history and other diagnostic information is  necessary to determine patient infection status.  Positive results do  not rule out bacterial infection or co-infection with other viruses. If result is PRESUMPTIVE POSTIVE SARS-CoV-2 nucleic acids MAY BE PRESENT.   A presumptive positive result was obtained on the submitted specimen  and confirmed on repeat testing.  While 2019 novel coronavirus  (SARS-CoV-2) nucleic acids may be present in the submitted sample  additional  confirmatory testing may be necessary for epidemiological  and / or clinical management purposes  to differentiate between  SARS-CoV-2 and other Sarbecovirus currently known to infect humans.  If clinically indicated additional testing with an alternate test  methodology [phone] is advised. The SARS-CoV-2 RNA is generally  detectable in upper and lower respiratory sp ecimens during the acute  phase of infection. The expected result is Negative. Fact Sheet for Patients:  StrictlyIdeas.no Fact Sheet for Healthcare Providers: BankingDealers.co.za This test is not yet approved or cleared by the [place] FDA and has been authorized for detection and/or diagnosis of SARS-CoV-2 by FDA under an Emergency Use Authorization (EUA).  This EUA will remain in effect (meaning this test can be used) for the duration of the COVID-19 declaration under Section 564(b)(1) of the Act, 21 U.S.C. section 360bbb-3(b)(1), unless the authorization is terminated or revoked sooner. Performed at [facility], [place] [address] [place], [place] [address]   Blood culture (routine x 2)     Status: None (Preliminary result)   Collection Time: 12/11/18  4:53 AM   Specimen: BLOOD LEFT FOREARM  Result Value Ref Range Status   Specimen Description   Final    BLOOD LEFT FOREARM Performed at [facility], [place] [address] [place], [place] [address]    Special Requests   Final    BOTTLES DRAWN AEROBIC ONLY Blood Culture adequate volume Performed at [facility] [address] [place], [place] [address]    Culture   Final    NO GROWTH < 24 HOURS Performed at [facility] [address], [place], [place] [address]    Report Status PENDING  Incomplete  Body fluid culture     Status: None (Preliminary result)   Collection Time: 12/11/18 11:31 AM   Specimen: Pleural, Right; Pleural Fluid  Result Value Ref Range Status    Specimen Description   Final    FLUID PLEURAL RIGHT Performed at [facility], [place] [address], [place], [place] [address]    Special Requests   Final    NONE Performed at [facility], [place] [address] [place], [place] [address]    Gram Stain   Final    RARE WBC PRESENT,BOTH PMN AND MONONUCLEAR NO ORGANISMS SEEN    Culture   Final    NO GROWTH < 24 HOURS Performed at [facility], [place] [address] [place], [place] [address]    Report Status PENDING  Incomplete     Labs: BNP (last 3 results) Recent Labs    12/10/18 1842  BNP 83.6   Basic Metabolic Panel: Recent Labs  Lab 12/06/18 0818 12/10/18 1842 12/11/18 0453  NA 139 137 138  K 4.3 3.8 3.9  CL 106 106 106  CO2 _0 GLUCOSE 207* 174* 231*  BUN _1 CREATININE 0.91 0.84 0.75  CALCIUM 8.4* 8.4* 8.2*  MG 1.2*  --   --  Liver Function Tests: Recent Labs  Lab 12/06/18 0818  AST 13*  ALT 9  ALKPHOS 94  BILITOT 0.3  PROT 7.8  ALBUMIN 3.0*   No results for input(s): LIPASE, AMYLASE in the last 168 hours. No results for input(s): AMMONIA in the last 168 hours. CBC: Recent Labs  Lab 12/06/18 0818 12/10/18 1842 12/11/18 0453  WBC 9.8 8.5 7.4  NEUTROABS 8.0*  --   --   HGB 9.5* 8.3* 8.2*  HCT 29.6* 25.8* 26.2*  MCV 96.4 98.5 100.0  PLT 274 262 240   Cardiac Enzymes: No results for input(s): CKTOTAL, CKMB, CKMBINDEX, TROPONINI in the last 168 hours. BNP: Invalid input(s): POCBNP CBG: Recent Labs  Lab 12/11/18 1149 12/11/18 1642 12/11/18 2053 12/12/18 0740 12/12/18 1132  GLUCAP 233* 198* 182* 95 115*   D-Dimer No results for input(s): DDIMER in the last 72 hours. Hgb A1c No results for input(s): HGBA1C in the last 72 hours. Lipid Profile No results for input(s): CHOL, HDL, LDLCALC, TRIG, CHOLHDL, LDLDIRECT in the last 72 hours. Thyroid function studies No results for input(s): TSH, T4TOTAL, T3FREE, THYROIDAB in the last 72 hours.  Invalid  input(s): FREET3 Anemia work up No results for input(s): VITAMINB12, FOLATE, FERRITIN, TIBC, IRON, RETICCTPCT in the last 72 hours. Urinalysis    Component Value Date/Time   COLORURINE YELLOW 04/29/2018 0419   APPEARANCEUR HAZY (A) 04/29/2018 0419   LABSPEC 1.025 04/29/2018 0419   PHURINE 5.0 04/29/2018 0419   GLUCOSEU 50 (A) 04/29/2018 0419   HGBUR MODERATE (A) 04/29/2018 0419   BILIRUBINUR NEGATIVE 04/29/2018 0419   KETONESUR NEGATIVE 04/29/2018 0419   PROTEINUR <30 10/11/2018 1510   UROBILINOGEN 0.2 02/01/2013 2022   NITRITE POSITIVE (A) 04/29/2018 0419   LEUKOCYTESUR MODERATE (A) 04/29/2018 0419   Sepsis Labs Invalid input(s): PROCALCITONIN,  WBC,  LACTICIDVEN Microbiology Recent Results (from the past 240 hour(s))  Blood culture (routine x 2)     Status: None (Preliminary result)   Collection Time: 12/10/18  6:45 PM   Specimen: BLOOD  Result Value Ref Range Status   Specimen Description   Final    BLOOD RIGHT ANTECUBITAL Performed at [facility], [place] [address], [place], [place] [address]    Special Requests   Final    BOTTLES DRAWN AEROBIC AND ANAEROBIC Blood Culture adequate volume Performed at [facility] [address], [place], [place] [address]    Culture   Final    NO GROWTH 1 DAY Performed at [facility], [place] [address], [place], [place] [address]    Report Status PENDING  Incomplete  SARS Coronavirus 2 [facility] order, Performed in [facility] hospital lab) Nasopharyngeal Nasopharyngeal Swab     Status: None   Collection Time: 12/11/18 12:07 AM   Specimen: Nasopharyngeal Swab  Result Value Ref Range Status   SARS Coronavirus 2 NEGATIVE NEGATIVE Final    Comment: (NOTE) If result is NEGATIVE SARS-CoV-2 target nucleic acids are NOT DETECTED. The SARS-CoV-2 RNA is generally detectable in upper and lower  respiratory specimens during the acute phase of infection. The lowest  concentration of SARS-CoV-2 viral  copies this assay can detect is 250  copies / mL. A negative result does not preclude SARS-CoV-2 infection  and should not be used as the sole basis for treatment or other  patient management decisions.  A negative result may occur with  improper specimen collection / handling, submission of specimen other  than nasopharyngeal swab, presence of viral mutation(s)  within the  areas targeted by this assay, and inadequate number of viral copies  (<250 copies / mL). A negative result must be combined with clinical  observations, patient history, and epidemiological information. If result is POSITIVE SARS-CoV-2 target nucleic acids are DETECTED. The SARS-CoV-2 RNA is generally detectable in upper and lower  respiratory specimens dur ing the acute phase of infection.  Positive  results are indicative of active infection with SARS-CoV-2.  Clinical  correlation with patient history and other diagnostic information is  necessary to determine patient infection status.  Positive results do  not rule out bacterial infection or co-infection with other viruses. If result is PRESUMPTIVE POSTIVE SARS-CoV-2 nucleic acids MAY BE PRESENT.   A presumptive positive result was obtained on the submitted specimen  and confirmed on repeat testing.  While 2019 novel coronavirus  (SARS-CoV-2) nucleic acids may be present in the submitted sample  additional confirmatory testing may be necessary for epidemiological  and / or clinical management purposes  to differentiate between  SARS-CoV-2 and other Sarbecovirus currently known to infect humans.  If clinically indicated additional testing with an alternate test  methodology [phone]) is advised. The SARS-CoV-2 RNA is generally  detectable in upper and lower respiratory sp ecimens during the acute  phase of infection. The expected result is Negative. Fact Sheet for Patients:  StrictlyIdeas.no Fact Sheet for Healthcare  Providers: BankingDealers.co.za This test is not yet approved or cleared by the [place] FDA and has been authorized for detection and/or diagnosis of SARS-CoV-2 by FDA under an Emergency Use Authorization (EUA).  This EUA will remain in effect (meaning this test can be used) for the duration of the COVID-19 declaration under Section 564(b)(1) of the Act, 21 U.S.C. section 360bbb-3(b)(1), unless the authorization is terminated or revoked sooner. Performed at [facility], [place] [address], [place], [place] [address]   Blood culture (routine x 2)     Status: None (Preliminary result)   Collection Time: 12/11/18  4:53 AM   Specimen: BLOOD LEFT FOREARM  Result Value Ref Range Status   Specimen Description   Final    BLOOD LEFT FOREARM Performed at [facility], [place] [address] [place] [address], [place], [place] [address]    Special Requests   Final    BOTTLES DRAWN AEROBIC ONLY Blood Culture adequate volume Performed at [facility] [address] [place], [place] [address]    Culture   Final    NO GROWTH < 24 HOURS Performed at [facility] [address] [place], [place] [address]    Report Status PENDING  Incomplete  Body fluid culture     Status: None (Preliminary result)   Collection Time: 12/11/18 11:31 AM   Specimen: Pleural, Right; Pleural Fluid  Result Value Ref Range Status   Specimen Description   Final    FLUID PLEURAL RIGHT Performed at [facility], [place] [address], [place], [place] [address]    Special Requests   Final    NONE Performed at [facility], [place] [address], [place], [place] [address]    Gram Stain   Final    RARE WBC PRESENT,BOTH PMN AND MONONUCLEAR NO ORGANISMS SEEN    Culture   Final    NO GROWTH < 24 HOURS Performed at [facility], [place] [address], [place], [place] [address]    Report Status PENDING  Incomplete     Time coordinating discharge: 35  minutes  SIGNED:  Cordelia Poche, MD [facility] 12/12/2018, 1:15 PM

## 2018-12-12 NOTE — Evaluation (Signed)
Physical Therapy Evaluation Patient Details Name: Marilyn [name] MRN: [id number] DOB: [birth year] Today's Date: 12/12/2018   History of Present Illness  53 yo female admitted with acute respiratory failure. Hx of DM, OA, asthma, CHF, met adenocarcinoma, neuropathy, obesity.  Clinical Impression  On eval, pt was Min guard assist for mobility. She walked ~15 feet in the room with use of a walker. Distance limited by pt wanting to immediately have snacks that RN brought into room. Pt was expressing her aggravation about not being able to eat at the start of the session. Pt tolerated activity fairly well. Discussed d/c plan-pt stated she will return home. She is agreeable to HHPT.     Follow Up Recommendations Home health PT;Supervision - Intermittent    Equipment Recommendations  None recommended by PT    Recommendations for Other Services       Precautions / Restrictions Precautions Precautions: Fall Restrictions Weight Bearing Restrictions: No      Mobility  Bed Mobility Overal bed mobility: Needs Assistance Bed Mobility: Supine to Sit     Supine to sit: Supervision;HOB elevated     General bed mobility comments: for safety. increased time.  Transfers Overall transfer level: Needs assistance Equipment used: Rolling walker (2 wheeled) Transfers: Sit to/from Stand Sit to Stand: Min guard;From elevated surface         General transfer comment: close guard for safety. increased time. unsteady but no lob.  Ambulation/Gait Ambulation/Gait assistance: Min guard Gait Distance (Feet): 15 Feet Assistive device: Rolling walker (2 wheeled) Gait Pattern/deviations: Step-through pattern;Decreased stride length     General Gait Details: close guard for safety. distance limited 2* pt eager to eat snacks that RN brought in.  Stairs            Wheelchair Mobility    Modified Rankin (Stroke Patients Only)       Balance Overall balance assessment: Needs assistance         Standing balance support: Bilateral upper extremity supported Standing balance-Leahy Scale: Poor                               Pertinent Vitals/Pain Pain Assessment: No/denies pain    Home Living Family/patient expects to be discharged to:: Private residence Living Arrangements: Alone Available Help at Discharge: Family;Available PRN/intermittently;Personal care attendant Type of Home: House Home Access: Ramped entrance     Home Layout: One level Home Equipment: Walker - 4 wheels      Prior Function Level of Independence: Independent         Comments: independent ADLs, aide assists with IADLs, pt completes med mgmt, - driving     Hand Dominance        Extremity/Trunk Assessment   Upper Extremity Assessment Upper Extremity Assessment: Generalized weakness    Lower Extremity Assessment Lower Extremity Assessment: Generalized weakness    Cervical / Trunk Assessment Cervical / Trunk Assessment: Normal  Communication   Communication: No difficulties  Cognition Arousal/Alertness: Awake/alert Behavior During Therapy: WFL for tasks assessed/performed Overall Cognitive Status: Within Functional Limits for tasks assessed                                 General Comments: pt was a bit aggravated about not having anything to eat.      General Comments      Exercises     Assessment/Plan  PT Assessment Patient needs continued PT services  PT Problem List Decreased strength;Decreased mobility;Decreased activity tolerance;Decreased balance;Decreased knowledge of use of DME       PT Treatment Interventions DME instruction;Gait training;Therapeutic activities;Therapeutic exercise;Functional mobility training;Patient/family education;Balance training    PT Goals (Current goals can be found in the Care Plan section)  Acute Rehab PT Goals Patient Stated Goal: home today PT Goal Formulation: With patient Time For Goal Achievement:  12/26/18 Potential to Achieve Goals: Good    Frequency Min 3X/week   Barriers to discharge        Co-evaluation               AM-PAC PT "6 Clicks" Mobility  Outcome Measure Help needed turning from your back to your side while in a flat bed without using bedrails?: A Little Help needed moving from lying on your back to sitting on the side of a flat bed without using bedrails?: A Little Help needed moving to and from a bed to a chair (including a wheelchair)?: A Little Help needed standing up from a chair using your arms (e.g., wheelchair or bedside chair)?: A Little Help needed to walk in hospital room?: A Little Help needed climbing 3-5 steps with a railing? : A Little 6 Click Score: 18    End of Session Equipment Utilized During Treatment: Gait belt Activity Tolerance: Patient tolerated treatment well Patient left: in chair;with call bell/phone within reach;with chair alarm set   PT Visit Diagnosis: Unsteadiness on feet (R26.81);Muscle weakness (generalized) (M62.81)    Time: 8757-9728 PT Time Calculation (min) (ACUTE ONLY): 15 min   Charges:   PT Evaluation $PT Eval Moderate Complexity: [place], PT Acute Rehabilitation Services Pager: [phone] Office: [phone]

## 2018-12-12 NOTE — Telephone Encounter (Signed)
Patient left voice mail and sent My Chart message: she has an appointment tomorrow (9/9) for treatment and does not think she is supposed to have it again this week - had treatment last week. Also states she is currently in hospital, has been for 3 days, and does not feel like she can come tomorrow anyway.  Per Dr. Irene Limbo, she should have next infusion and appointments scheduled 3 weeks from 9/2 (9/23), per his notes/direction on 9/2. Message sent to scheduling to schedule appts for 9/23. [appts for 9/9 left on appts from 7/29 los, now cancelled]  Attempted to contact patient - LVM that appts for 9/9 have been cancelled and that scheduling will contact her to schedule appts for 9/23 and start of next cycle of treatment. Encouraged her to contact office for questions or further concerns.

## 2018-12-13 ENCOUNTER — Inpatient Hospital Stay: Payer: Medicare Other | Admitting: Hematology

## 2018-12-13 ENCOUNTER — Inpatient Hospital Stay: Payer: Medicare Other

## 2018-12-13 ENCOUNTER — Telehealth: Payer: Self-pay | Admitting: *Deleted

## 2018-12-13 ENCOUNTER — Telehealth: Payer: Self-pay | Admitting: Hematology

## 2018-12-13 DIAGNOSIS — C792 Secondary malignant neoplasm of skin: Secondary | ICD-10-CM | POA: Diagnosis not present

## 2018-12-13 DIAGNOSIS — E7800 Pure hypercholesterolemia, unspecified: Secondary | ICD-10-CM | POA: Diagnosis not present

## 2018-12-13 DIAGNOSIS — K649 Unspecified hemorrhoids: Secondary | ICD-10-CM | POA: Diagnosis not present

## 2018-12-13 DIAGNOSIS — I429 Cardiomyopathy, unspecified: Secondary | ICD-10-CM | POA: Diagnosis not present

## 2018-12-13 DIAGNOSIS — E669 Obesity, unspecified: Secondary | ICD-10-CM | POA: Diagnosis not present

## 2018-12-13 DIAGNOSIS — J45909 Unspecified asthma, uncomplicated: Secondary | ICD-10-CM | POA: Diagnosis not present

## 2018-12-13 DIAGNOSIS — I10 Essential (primary) hypertension: Secondary | ICD-10-CM | POA: Diagnosis not present

## 2018-12-13 DIAGNOSIS — Z7982 Long term (current) use of aspirin: Secondary | ICD-10-CM | POA: Diagnosis not present

## 2018-12-13 DIAGNOSIS — Z6837 Body mass index (BMI) 37.0-37.9, adult: Secondary | ICD-10-CM | POA: Diagnosis not present

## 2018-12-13 DIAGNOSIS — Z9181 History of falling: Secondary | ICD-10-CM | POA: Diagnosis not present

## 2018-12-13 DIAGNOSIS — Z794 Long term (current) use of insulin: Secondary | ICD-10-CM | POA: Diagnosis not present

## 2018-12-13 DIAGNOSIS — T451X5D Adverse effect of antineoplastic and immunosuppressive drugs, subsequent encounter: Secondary | ICD-10-CM | POA: Diagnosis not present

## 2018-12-13 DIAGNOSIS — Z7952 Long term (current) use of systemic steroids: Secondary | ICD-10-CM | POA: Diagnosis not present

## 2018-12-13 DIAGNOSIS — Z79891 Long term (current) use of opiate analgesic: Secondary | ICD-10-CM | POA: Diagnosis not present

## 2018-12-13 DIAGNOSIS — E039 Hypothyroidism, unspecified: Secondary | ICD-10-CM | POA: Diagnosis not present

## 2018-12-13 DIAGNOSIS — E1140 Type 2 diabetes mellitus with diabetic neuropathy, unspecified: Secondary | ICD-10-CM | POA: Diagnosis not present

## 2018-12-13 DIAGNOSIS — F329 Major depressive disorder, single episode, unspecified: Secondary | ICD-10-CM | POA: Diagnosis not present

## 2018-12-13 DIAGNOSIS — D649 Anemia, unspecified: Secondary | ICD-10-CM | POA: Diagnosis not present

## 2018-12-13 DIAGNOSIS — C3491 Malignant neoplasm of unspecified part of right bronchus or lung: Secondary | ICD-10-CM | POA: Diagnosis not present

## 2018-12-13 DIAGNOSIS — M1990 Unspecified osteoarthritis, unspecified site: Secondary | ICD-10-CM | POA: Diagnosis not present

## 2018-12-13 DIAGNOSIS — J910 Malignant pleural effusion: Secondary | ICD-10-CM | POA: Diagnosis not present

## 2018-12-13 DIAGNOSIS — J9601 Acute respiratory failure with hypoxia: Secondary | ICD-10-CM | POA: Diagnosis not present

## 2018-12-13 DIAGNOSIS — G620 Drug-induced polyneuropathy: Secondary | ICD-10-CM | POA: Diagnosis not present

## 2018-12-13 DIAGNOSIS — R000 Tachycardia, unspecified: Secondary | ICD-10-CM | POA: Diagnosis not present

## 2018-12-13 DIAGNOSIS — Z8701 Personal history of pneumonia (recurrent): Secondary | ICD-10-CM | POA: Diagnosis not present

## 2018-12-13 NOTE — Telephone Encounter (Signed)
Scheduled appt per 9/8 sch message - spoke with pt and she is aware of appt date and time

## 2018-12-13 NOTE — Telephone Encounter (Signed)
Contacted by Huntley Estelle, PT with [facility]. [phone]. Patient was d/c'd from hospital with request for home PT to improve strength lost d/t deconditioning. PT w/[facility] requested verbal orders for following: 1 x week for this week; 2 x week for next 2 weeks; 1 x week for next 6 weeks. Dr. Irene Limbo gave verbal orders. Attempted to contact Ms. Bathgate - left voice mail on secure voice mail with verbal orders as above.

## 2018-12-14 LAB — BODY FLUID CULTURE: Culture: NO GROWTH

## 2018-12-15 ENCOUNTER — Telehealth: Payer: Self-pay | Admitting: *Deleted

## 2018-12-15 NOTE — Telephone Encounter (Signed)
[facility] faxed Prior authorization request for Lidocaine-Prilocaine Cream 30 GM tube.  Request to [facility] letter tray receptacle of Prior Authorization requests and forms for review.

## 2018-12-16 LAB — CULTURE, BLOOD (ROUTINE X 2)
Culture: NO GROWTH
Culture: NO GROWTH
Special Requests: ADEQUATE
Special Requests: ADEQUATE

## 2018-12-20 DIAGNOSIS — J910 Malignant pleural effusion: Secondary | ICD-10-CM | POA: Diagnosis not present

## 2018-12-20 DIAGNOSIS — C792 Secondary malignant neoplasm of skin: Secondary | ICD-10-CM | POA: Diagnosis not present

## 2018-12-20 DIAGNOSIS — G620 Drug-induced polyneuropathy: Secondary | ICD-10-CM | POA: Diagnosis not present

## 2018-12-20 DIAGNOSIS — J9601 Acute respiratory failure with hypoxia: Secondary | ICD-10-CM | POA: Diagnosis not present

## 2018-12-20 DIAGNOSIS — C3491 Malignant neoplasm of unspecified part of right bronchus or lung: Secondary | ICD-10-CM | POA: Diagnosis not present

## 2018-12-20 DIAGNOSIS — T451X5D Adverse effect of antineoplastic and immunosuppressive drugs, subsequent encounter: Secondary | ICD-10-CM | POA: Diagnosis not present

## 2018-12-21 ENCOUNTER — Telehealth: Payer: Self-pay | Admitting: *Deleted

## 2018-12-21 NOTE — Telephone Encounter (Signed)
Received VM from PT- [name] with [facility] on 9/16 [phone]). She visited pt for initial PT assessment. Patient informed her that she had not really eaten in 2 weeks and was not taking her medication because she couldn't take it without food. PT reported VS: HR 126 (resting); B/P 146/84; O2 90%; and stated weight 262 lbs (patient declined to be weighed). Contacted [name] to clarify/verify observations regarding pt taking medication. She stated that information was exactly as reported by patient, stating patient told her she had no appetite.   Information given to Dr. Irene Limbo. Dr. Irene Limbo asked that patient be contacted and encouraged to eat enough to take medication for health conditions  - they are necessary especially since she is in treatment.  Contacted patient - she states she can't take medication without enough food in her stomach and she isn't hungry. Advised her that Dr.Kale wanted her to know that it was important to take meds. Inquired about nausea, she said it bothered her some - advised her to take medication available for nausea (compazine/zofran) to allow her to eat. Encouraged drinking ensure as it will allow her to take medications. Patient stated she would try to eat and take meds. Encouraged to contact office as needed.

## 2018-12-22 DIAGNOSIS — G620 Drug-induced polyneuropathy: Secondary | ICD-10-CM | POA: Diagnosis not present

## 2018-12-22 DIAGNOSIS — C3491 Malignant neoplasm of unspecified part of right bronchus or lung: Secondary | ICD-10-CM | POA: Diagnosis not present

## 2018-12-22 DIAGNOSIS — T451X5D Adverse effect of antineoplastic and immunosuppressive drugs, subsequent encounter: Secondary | ICD-10-CM | POA: Diagnosis not present

## 2018-12-22 DIAGNOSIS — J9601 Acute respiratory failure with hypoxia: Secondary | ICD-10-CM | POA: Diagnosis not present

## 2018-12-22 DIAGNOSIS — C792 Secondary malignant neoplasm of skin: Secondary | ICD-10-CM | POA: Diagnosis not present

## 2018-12-22 DIAGNOSIS — J910 Malignant pleural effusion: Secondary | ICD-10-CM | POA: Diagnosis not present

## 2018-12-23 NOTE — Progress Notes (Signed)
HEMATOLOGY/ONCOLOGY CLINIC NOTE  Date of Service: 12/27/18     Patient Care Team: [name], FNP as PCP - General (Nurse Practitioner) Pixie Casino, MD as PCP - Cardiology (Cardiology)  CHIEF COMPLAINTS F/u for continued management of metastatic lung adenocarcinoma  Diagnosis Metastatic Lung Adenocarcinoma with rt flank subcutaneous metastasis.  Current treatment: Carboplatin/Alimta/Avastin  PreviousTreatment  -Concurrent Chemo-radiation with Carboplatin/Taxol. Completed RT on 02/13/2016 and has then completed 2 cycles of carboplatin + taxol. -Started Maintenance Durvalumab from  05/24/2016 q2weeks, switched to Nivolumab q2weeks on 12/13/16 once metastatic disease noted -s/p palliative RT to rt sided Subcutaneous metastatic mass.   Nivolumab q2 weeks Palliative RT to pain rt inguinal mass   HISTORY OF PRESENTING ILLNESS: Plz see my previous note for details on initial presentation.   INTERVAL HISTORY:  Marilyn [name] returns today for followup of her metastatic lung cancer. She is here for C5D1 of Avastin, and Alimta. The patient's last visit with [place] was on 12/06/2018. The pt reports that she is doing well overall.  The pt reports that she has been feeling down lately. She has lost her appetite and has not been eating well. She did eat two meals yesterday but began vomiting after both. Pt has been experiencing nausea and emesis ongoing. She states that her nausea medication helps occasionally. Due to her not being able to keep food down she stopped taking all of her medication for a period of time. She has recently began taking her insulin and her thyroid medication again. Pt is sore from her right shoulder to her right pelvis. She is also experiencing the urge to urinate very frequently. She is able to go urinate on command and there is no accompaning burning sensation. Pt is also still feeling numbness/ tingling in her fingers. Pt has been taking Senna and  Miralax for her constipation but feels that they are not helping enough. She has resorted to using Epsom Salt orally to alleviate her constipation. Pt had fluid removed on 09/06 and it has helped her breathing but she is still experiencing fatigue and SOB. The SOB is worsened when walking or moving about but does not bother her while she is sitting. She is interested in getting a bed put in her home so that she can sleep erect to help with her breathing. Her fatigue worsens after her chemotherapy treatments. Pt is seeing her PT once a week and she has a home aid that comes everyday. Pt is also unable to pay for the Ensure supplements that she was recommended. She is not ready to consider hospice care.   Of note since the patient's last visit, pt has had CT Angio Chest PE ([id number]) completed on 12/10/2018 with results revealing "1. No pulmonary embolism. 2. Interval enlargement of moderate to large right pleural effusion 3. Post radiation fibrotic changes seen in the medial right upper lung. 4. Right axillary adenopathy with surrounding inflammatory changes and central necrosis."  Pt has had US Thoracentesis ([id number]) completed on 12/11/2018 with results revealing "Successful ultrasound-guided right sided thoracentesis yielding 400 cc of pleural fluid."  Lab results today (12/27/18) of CBC w/diff and CMP is as follows: all values are WNL except for RBC at 2.67, Hgb at 8.9, HCT at 27.1, MCV at 101.5, RDW at 21.8, nRBC at 0.3, Glucose at 202, Calcium at 8.6, Albumin at 3.0.  On review of systems, pt reports right leg swelling, abdominal pain, constipation, SOB, fatigue, nausea, emesis, loss of appetite, pain along her  right side, urge to urinate frequently, numbness/tingling in her fingers and denies discomfort while urinating and any other symptoms.    MEDICAL HISTORY:  Past Medical History:  Diagnosis Date   Arthritis    Asthma    Diabetes mellitus    Type II   Hemorrhoids     Hypercholesteremia    Hypertension    Hypothyroidism    Metastatic lung cancer (metastasis from lung to other site) (HCC)    Lung, Mets to skin- right flank. 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 mild - EF 45-50% by echo 10/2016 but EF 52% by low risk nuc 11/2016. b. Echo 07/2017 - EF normal, mild AI, unremarkable echo otherwise.   Obese    Pneumonia 2017   Sinus tachycardia    a. dates back to at least 2013, etiology unclear.    SURGICAL HISTORY: Past Surgical History:  Procedure Laterality Date   CESAREAN SECTION     COLONOSCOPY     I/D  arm Right 2013   IR FLUORO GUIDE PORT INSERTION RIGHT  02/18/2017   IR GENERIC HISTORICAL  01/08/2016   IR US GUIDE VASC ACCESS RIGHT 01/08/2016 [name], DO [facility]   IR GENERIC HISTORICAL  01/08/2016   IR FLUORO GUIDE CV LINE RIGHT 01/08/2016 [name], DO [facility]   IR THORACENTESIS ASP PLEURAL SPACE W/IMG GUIDE  05/25/2018   IR US GUIDE VASC ACCESS RIGHT  02/18/2017   MULTIPLE EXTRACTIONS WITH ALVEOLOPLASTY N/A 03/25/2017   Procedure: MULTIPLE EXTRACTION WITH ALVEOLOPLASTY (teeth #s four, six, seven, eight, nine, ten, eleven, one, twenty-three, twenty-four, twenty-five, twenty-six, twenty-nine, thirty);  Surgeon: [name], [name];  Location: [facility];  Service: Oral Surgery;  Laterality: N/A;   TUBAL LIGATION     US guided core needle biopsy     of right lower neck/supraclavicular lymph nodes    SOCIAL HISTORY: Social History   Socioeconomic History   Marital status: Divorced    Spouse name: Not on file   Number of children: Not on file   Years of education: Not on file   Highest education level: Not on file  Occupational History   Not on file  Social Needs   Financial resource strain: Not on file   Food insecurity    Worry: Not on file    Inability: Not  on file   Transportation needs    Medical: No    Non-medical: No  Tobacco Use   Smoking status: Never Smoker   Smokeless tobacco: Never Used  Substance and Sexual Activity   Alcohol use: No   Drug use: No   Sexual activity: Not Currently  Lifestyle   Physical activity    Days per week: Not on file    Minutes per session: Not on file   Stress: Not on file  Relationships   Social connections    Talks on phone: Not on file    Gets together: Not on file    Attends religious service: Not on file    Active member of club or organization: Not on file    Attends meetings of clubs or organizations: Not on file    Relationship status: Not on file   Intimate partner violence    Fear of current or ex partner: No    Emotionally abused: No    Physically abused: No  Forced sexual activity: No  Other Topics Concern   Not on file  Social History Narrative   No bird or mold exposure. No recent travel.   11-23-17 Unable to ask abuse questions mother with her today.    FAMILY HISTORY: Family History  Problem Relation Age of Onset   Heart failure Mother    Hypertension Mother    Cancer Neg Hx    Rheumatologic disease Neg Hx     ALLERGIES:  has No Known Allergies.  MEDICATIONS:  Current Outpatient Medications  Medication Sig Dispense Refill   ACCU-CHEK AVIVA PLUS test strip 1 each by Other route 3 (three) times daily.      ASPIRIN LOW DOSE 81 MG EC tablet TAKE 1 TABLET BY MOUTH EVERY DAY (Patient taking differently: Take 81 mg by mouth daily. Enteric Coated.  ** DO NOT CRUSH **) 60 tablet 2   atorvastatin (LIPITOR) 40 MG tablet Take 1 tablet (40 mg total) by mouth daily at 6 PM. 30 tablet 0   dexamethasone (DECADRON) 4 MG tablet Take 1 tab two times a day the day before Alimta chemo. Take 2 tabs two times a day starting the day after chemo for 3 days. (Patient taking differently: Take 4-8 mg by mouth See admin instructions. Take 4 mg twice daily the day before  Alimta chemo, then take 8 mg twice daily starting the day after chemo for 3 days.) 30 tablet 1   diclofenac sodium (VOLTAREN) 1 % GEL Apply 2 g topically 2 (two) times daily as needed (for chestwall pain from costochondiritis). 100 g 1   DULoxetine (CYMBALTA) 60 MG capsule TAKE 1 CAPSULE BY MOUTH EVERY DAY (Patient taking differently: Take 60 mg by mouth daily. ) 30 capsule 2   feeding supplement, ENSURE ENLIVE, (ENSURE ENLIVE) LIQD Take 237 mLs by mouth 2 (two) times daily between meals. 7017 mL 0   folic acid (FOLVITE) 1 MG tablet Take 1 tablet (1 mg total) by mouth daily. Start 5-7 days before Alimta chemotherapy. Continue until 21 days after Alimta completed. 100 tablet 3   glucose monitoring kit (FREESTYLE) monitoring kit 1 each by Does not apply route as needed for other. 1 each 0   hydrOXYzine (ATARAX/VISTARIL) 10 MG tablet Take 1 tablet (10 mg total) by mouth 3 (three) times daily as needed for itching. 30 tablet 0   insulin aspart (NOVOLOG) 100 UNIT/ML FlexPen As per sliding scale instructions (Patient taking differently: Inject 0-10 Units into the skin 3 (three) times daily. As per sliding scale instructions) 15 mL 2   Insulin Degludec (TRESIBA FLEXTOUCH) 200 UNIT/ML SOPN Inject 100 Units into the skin daily before breakfast.      levalbuterol (XOPENEX HFA) 45 MCG/ACT inhaler Inhale 1-2 puffs into the lungs every 8 (eight) hours as needed for wheezing. 1 Inhaler 12   levothyroxine (SYNTHROID) 150 MCG tablet TAKE 1 TABLET BY MOUTH EVERY DAY BEFORE BREAKFAST (Patient taking differently: Take 150 mcg by mouth daily before breakfast. ) 30 tablet 2   lidocaine-prilocaine (EMLA) cream Apply 1 application topically as needed. (Patient taking differently: Apply 1 application topically as needed (port access). ) 30 g 6   liver oil-zinc oxide (DESITIN) 40 % ointment Apply topically 4 (four) times daily -  with meals and at bedtime. Apply to coccyx/gluteal fold wound (Patient not taking:  Reported on 12/11/2018) 56.7 g 0   LORazepam (ATIVAN) 0.5 MG tablet Take 1 tablet (0.5 mg total) by mouth every 6 (six) hours as needed (Nausea or  vomiting). 30 tablet 0   magnesium oxide (MAG-OX) 400 (241.3 Mg) MG tablet Take 1 tablet (400 mg total) by mouth 2 (two) times daily. 60 tablet 2   metoprolol succinate (TOPROL-XL) 50 MG 24 hr tablet Take 1 tablet (58m) by mouth in the morning and 1/2 tablet (230m in the evening. Take with or immediately following a meal. (Patient taking differently: Take 25-50 mg by mouth 2 (two) times daily. Take 5068my mouth in the morning and 68m56m the evening. Take with or immediately following a meal.) 135 tablet 3   mupirocin ointment (BACTROBAN) 2 % Apply topically 2 (two) times daily. Apply into wound bed on right inner upper thigh.  Cover with a small foam dressing. Change the foam daily and prn (Patient not taking: Reported on 12/11/2018) 22 g 0   naproxen (NAPROSYN) 500 MG tablet Take 1 tablet (500 mg total) by mouth 2 (two) times daily with a meal. (Patient not taking: Reported on 08/10/2018) 20 tablet 0   nitrofurantoin, macrocrystal-monohydrate, (MACROBID) 100 MG capsule Take 1 capsule (100 mg total) by mouth every 12 (twelve) hours. (Patient not taking: Reported on 08/10/2018) 8 capsule 0   ondansetron (ZOFRAN) 8 MG tablet Take 1 tablet (8 mg total) by mouth 2 (two) times daily as needed for refractory nausea / vomiting. Start on day 3 after chemo. 30 tablet 1   oxyCODONE (OXY IR/ROXICODONE) 5 MG immediate release tablet Take 1 tablet (5 mg total) by mouth every 4 (four) hours as needed for severe pain. 15 tablet 0   pantoprazole (PROTONIX) 40 MG tablet Take 1 tablet (40 mg total) by mouth daily. 30 tablet 2   senna-docusate (SENNA S) 8.6-50 MG tablet Take 2 tablets by mouth at bedtime. 60 tablet 1   traZODone (DESYREL) 50 MG tablet Take 1-2 tablets (50-100 mg total) by mouth at bedtime as needed for sleep. (Patient not taking: Reported on 12/11/2018) 60  tablet 0   Vitamins A & D (VITAMIN A & D) OINT Apply 1 application topically 2 (two) times daily. (Patient taking differently: Apply 1 application topically 2 (two) times daily as needed (itching). ) 1 Tube 3   No current facility-administered medications for this visit.    Facility-Administered Medications Ordered in Other Visits  Medication Dose Route Frequency Provider Last Rate Last Dose   sodium chloride 0.9 % injection 10 mL  10 mL Intravenous PRN KaleBrunetta Genera   10 mL at 05/24/16 1252   sodium chloride flush (NS) 0.9 % injection 10 mL  10 mL Intracatheter Once KaleBrunetta Genera       sodium chloride flush (NS) 0.9 % injection 10 mL  10 mL Intravenous PRN CausGardenia Phlegm   10 mL at 12/27/18 0854    REVIEW OF SYSTEMS:    A 10+ POINT REVIEW OF SYSTEMS WAS OBTAINED including neurology, dermatology, psychiatry, cardiac, respiratory, lymph, extremities, GI, GU, Musculoskeletal, constitutional, breasts, reproductive, HEENT.  All pertinent positives are noted in the HPI.  All others are negative.   PHYSICAL EXAMINATION: ECOG PERFORMANCE STATUS: 1 - Symptomatic but completely ambulatory  Vitals:   12/27/18 0942  BP: (!) 126/91  Pulse: (!) 104  Resp: 18  Temp: 97.8 F (36.6 C)  SpO2: 100%   Filed Weights   12/27/18 0942  Weight: 264 lb 3.2 oz (119.8 kg)   .Body mass index is 39.02 kg/m.   Exam was given in a wheel chair   GENERAL:alert, in no acute distress and  comfortable SKIN: no acute rashes, no significant lesions EYES: conjunctiva are pink and non-injected, sclera anicteric OROPHARYNX: MMM, no exudates, no oropharyngeal erythema or ulceration NECK: supple, no JVD LYMPH:  no palpable lymphadenopathy in the cervical, axillary or inguinal regions LUNGS: fluid 1/4th up the chest wall on the right side  HEART: regular rate & rhythm ABDOMEN:  normoactive bowel sounds , non tender, not distended. No palpable hepatosplenomegaly.  Extremity:  no pedal edema PSYCH: alert & oriented x 3 with fluent speech NEURO: no focal motor/sensory deficits  LABORATORY DATA: .  [name] CBC Latest Ref Rng & Units 12/11/2018 12/10/2018 12/06/2018  WBC 4.0 - 10.5 K/uL 7.4 8.5 9.8  Hemoglobin 12.0 - 15.0 g/dL 8.2(L) 8.3(L) 9.5(L)  Hematocrit 36.0 - 46.0 % 26.2(L) 25.8(L) 29.6(L)  Platelets 150 - 400 K/uL 240 262 274    . CMP Latest Ref Rng & Units 12/11/2018 12/10/2018 12/06/2018  Glucose 70 - 99 mg/dL 231(H) 174(H) 207(H)  BUN 6 - 20 mg/dL '10 10 10  ' Creatinine 0.44 - 1.00 mg/dL 0.75 0.84 0.91  Sodium 135 - 145 mmol/L 138 137 139  Potassium 3.5 - 5.1 mmol/L 3.9 3.8 4.3  Chloride 98 - 111 mmol/L 106 106 106  CO2 22 - 32 mmol/L '23 22 22  ' Calcium 8.9 - 10.3 mg/dL 8.2(L) 8.4(L) 8.4(L)  Total Protein 6.5 - 8.1 g/dL - - 7.8  Total Bilirubin 0.3 - 1.2 mg/dL - - 0.3  Alkaline Phos 38 - 126 U/L - - 94  AST 15 - 41 U/L - - 13(L)  ALT 0 - 44 U/L - - 9    11/16/2018 Pleural Fluid Biopsy ([id number])   09/15/18 Pleural Fluid Cytology:             08/18/17 Bx:   08/16/17 Molecular Pathology:    RADIOGRAPHIC STUDIES:  .Dg Chest 1 View  Result Date: 12/11/2018 CLINICAL DATA:  Post right-sided thoracentesis. EXAM: CHEST  1 VIEW COMPARISON:  Chest radiograph-12/10/2018; chest CT-12/10/2018 FINDINGS: Grossly unchanged cardiac silhouette and mediastinal contours with obscuration of the right hilum secondary to consolidative opacities, volume loss and architectural distortion as demonstrated on preceding chest CT. No change to slight reduction of right-sided pleural effusion post thoracentesis. No pneumothorax. The left hemithorax remains well aerated. No new focal airspace opacities. No evidence of edema. Stable position of support apparatus. No acute osseous abnormalities. IMPRESSION: 1. No change to slight reduction of right-sided pleural effusion post thoracentesis. No pneumothorax. 2. Persistent obscuration of the right hilum secondary to consolidative  opacities, volume loss and architectural distortion. Electronically Signed   By: Sandi Mariscal M.D.   On: 12/11/2018 11:33   Ct Chest W Contrast  Result Date: 11/29/2018 CLINICAL DATA:  53 year old female with history of stage IV lung cancer undergoing ongoing chemotherapy. Mid chest pain for the past 3 weeks. Cough and shortness of breath. Follow-up study. EXAM: CT CHEST, ABDOMEN, AND PELVIS WITH CONTRAST TECHNIQUE: Multidetector CT imaging of the chest, abdomen and pelvis was performed following the standard protocol during bolus administration of intravenous contrast. CONTRAST:  169m OMNIPAQUE IOHEXOL 300 MG/ML  SOLN COMPARISON:  Chest CT 11/16/2018. CT the abdomen and pelvis 05/30/2018. Multiple other priors. FINDINGS: CT CHEST FINDINGS Cardiovascular: Heart size is normal. There is no significant pericardial fluid, thickening or pericardial calcification. There is aortic atherosclerosis, as well as atherosclerosis of the great vessels of the mediastinum and the coronary arteries, including calcified atherosclerotic plaque in the left anterior descending, left circumflex and right coronary arteries. Mediastinum/Nodes: Low right  paratracheal lymph node measuring 1.7 cm in short axis. Esophagus is unremarkable in appearance. Enlarged right axillary lymph node measuring 2.7 cm in short axis, similar to the prior study. Other smaller right axillary lymph nodes are also noted. Lungs/Pleura: Chronic mass-like areas of architectural distortion in the perihilar aspect of the right upper lobe and superior segment of the right lower lobe, similar to the prior study, most compatible with chronic postradiation mass-like fibrosis. Moderate right pleural effusion lying dependently. No acute consolidative airspace disease. No pleural effusions. No suspicious appearing pulmonary nodules or masses are noted. Musculoskeletal: There are no aggressive appearing lytic or blastic lesions noted in the visualized portions of the  skeleton. CT ABDOMEN PELVIS FINDINGS Hepatobiliary: Ill-defined low-attenuation area in segment 4B of the liver adjacent to the falciform ligament, incompletely characterize, but similar to focal low-attenuation seen on prior study 05/30/2018, most compatible with focal fatty infiltration. No other suspicious cystic or solid hepatic lesions. No intra or extrahepatic biliary ductal dilatation. Gallbladder is contracted around several noncalcified gallstones. No surrounding pericholecystic fluid or inflammatory changes to suggest an acute cholecystitis at this time. Pancreas: No pancreatic mass. No pancreatic ductal dilatation. No pancreatic or peripancreatic fluid collections or inflammatory changes. Spleen: Unremarkable. Adrenals/Urinary Tract: Bilateral kidneys and adrenal glands are normal in appearance. No hydroureteronephrosis. Urinary bladder is normal in appearance. Stomach/Bowel: Normal appearance of the stomach. No pathologic dilatation of small bowel or colon. The appendix is not confidently identified and may be surgically absent. Regardless, there are no inflammatory changes noted adjacent to the cecum to suggest the presence of an acute appendicitis at this time. Vascular/Lymphatic: Aortic atherosclerosis, without evidence of aneurysm or dissection in the abdominal or pelvic vasculature. Multiple prominent borderline enlarged and mildly enlarged retroperitoneal and pelvic lymph nodes. The largest retroperitoneal lymph nodes measure up to 1 cm in short axis in the aortocaval nodal station (axial image 75 of series 2). The largest pelvic lymph nodes are in the right internal iliac nodal chain measuring up to 1.5 cm in short axis on axial image 103 of series 2 and 109 of series 2. 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 Ovaries are unremarkable in appearance. Other: No significant volume of ascites.  No pneumoperitoneum. Musculoskeletal:  There are no aggressive appearing lytic or blastic lesions noted in the visualized portions of the skeleton. IMPRESSION: 1. Persistent low right paratracheal lymphadenopathy, similar to the prior study. This may reflect residual disease. Stable area of presumed postradiation mass-like fibrosis in the perihilar aspect of the right upper lung. 2. Moderate right pleural effusion is similar to the prior examination. 3. Lymphadenopathy also noted in the right axillary region, right hemipelvis and retroperitoneum, as above, potentially reflective of metastatic disease. 4. Focal low-attenuation region in segment 4B of the liver, similar to prior noncontrast CT examination of the abdomen, likely to represent focal fatty infiltration. 5. Cholelithiasis without evidence of acute cholecystitis at this time. 6. Aortic atherosclerosis, in addition to 3 vessel coronary artery disease. Please note that although the presence of coronary artery calcium documents the presence of coronary artery disease, the severity of this disease and any potential stenosis cannot be assessed on this non-gated CT examination. Assessment for potential risk factor modification, dietary therapy or pharmacologic therapy may be warranted, if clinically indicated. 7. Additional incidental findings, as above. Electronically Signed   By: Vinnie Langton M.D.   On: 11/29/2018 14:09   Ct Angio Chest Pe W And/or Wo Contrast  Result Date:  12/10/2018 CLINICAL DATA:  Shortness of breath and coughing EXAM: CT ANGIOGRAPHY CHEST WITH CONTRAST TECHNIQUE: Multidetector CT imaging of the chest was performed using the standard protocol during bolus administration of intravenous contrast. Multiplanar CT image reconstructions and MIPs were obtained to evaluate the vascular anatomy. CONTRAST:  141m OMNIPAQUE IOHEXOL 350 MG/ML SOLN COMPARISON:  11/29/2018 FINDINGS: The study is somewhat limited due to patient motion. Cardiovascular: There is a optimal opacification of  the pulmonary arteries. There is no central,segmental, or subsegmental filling defects within the pulmonary arteries. There is mild cardiomegaly. No evidence of right ventricular heart strain. There is normal three-vessel brachiocephalic anatomy without proximal stenosis. The thoracic aorta is normal in appearance. Mediastinum/Nodes: There is soft tissue thickening seen at along the right paratracheal stripe as on the prior exam. There is marked right axillary adenopathy with areas of central necrosis and surrounding fat stranding changes as on the prior exam. Lungs/Pleura: There is interval large min of a moderate to large right pleural effusion. Postradiation fibrotic changes are seen in the medial right upper lung. There is hazy bibasilar atelectasis. Upper Abdomen: No acute abnormalities present in the visualized portions of the upper abdomen. Musculoskeletal: No chest wall abnormality. No acute or significant osseous findings. Review of the MIP images confirms the above findings. IMPRESSION: 1. No pulmonary embolism. 2. Interval enlargement of moderate to large right pleural effusion 3. Post radiation fibrotic changes seen in the medial right upper lung. 4. Right axillary adenopathy with surrounding inflammatory changes and central necrosis. Electronically Signed   By: BPrudencio PairM.D.   On: 12/10/2018 23:16   Nm Bone Scan Whole Body  Result Date: 11/29/2018 CLINICAL DATA:  Non-small cell lung cancer EXAM: NUCLEAR MEDICINE WHOLE BODY BONE SCAN TECHNIQUE: Whole body anterior and posterior images were obtained approximately 3 hours after intravenous injection of radiopharmaceutical. RADIOPHARMACEUTICALS:  21 mCi Technetium-942mDP IV COMPARISON:  Correlation: PET-CT 08/03/2017, CT chest abdomen pelvis 11/29/2018 FINDINGS: Decreased tracer localization is identified within upper thoracic spine and in upper LEFT ribs. Retained tracer within RIGHT jugular Port-A-Cath reservoir and tubing. Questionable foci of  increased tracer accumulation at the calvarium especially posterior RIGHT temporal region and LEFT parietal region, nonspecific but cannot exclude osseous metastases. Nonspecific uptake of tracer at the distal sternum/xiphoid process, no abnormality seen on CT. No additional worrisome sites of osseous tracer accumulation are identified. Asymmetric tracer retention within the RIGHT renal collecting system versus LEFT though no hydronephrosis is identified on CT. Diffuse uptake of tracer in the RIGHT hemithorax corresponding to pleural effusion on CT. Remaining urinary tract and soft tissue distribution of tracer is unremarkable. IMPRESSION: Questionable foci of increased tracer accumulation in the calvaria and at the distal sternum/xiphoid process, uncertain etiology, cannot exclude osseous metastatic disease No other worrisome sites of osseous tracer accumulation are identified. Decreased tracer uptake at the thoracic spine and at upper LEFT ribs, nonspecific, can be seen at radiation therapy ports recommend correlation with patient history. RIGHT pleural effusion. Electronically Signed   By: MaLavonia Dana.D.   On: 11/29/2018 16:33   Ct Abdomen Pelvis W Contrast  Result Date: 11/29/2018 CLINICAL DATA:  [id number] old female with history of stage IV lung cancer undergoing ongoing chemotherapy. Mid chest pain for the past 3 weeks. Cough and shortness of breath. Follow-up study. EXAM: CT CHEST, ABDOMEN, AND PELVIS WITH CONTRAST TECHNIQUE: Multidetector CT imaging of the chest, abdomen and pelvis was performed following the standard protocol during bolus administration of intravenous contrast. CONTRAST:  10015mMNIPAQUE IOHEXOL 300  MG/ML  SOLN COMPARISON:  Chest CT 11/16/2018. CT the abdomen and pelvis 05/30/2018. Multiple other priors. FINDINGS: CT CHEST FINDINGS Cardiovascular: Heart size is normal. There is no significant pericardial fluid, thickening or pericardial calcification. There is aortic atherosclerosis, as  well as atherosclerosis of the great vessels of the mediastinum and the coronary arteries, including calcified atherosclerotic plaque in the left anterior descending, left circumflex and right coronary arteries. Mediastinum/Nodes: Low right paratracheal lymph node measuring 1.7 cm in short axis. Esophagus is unremarkable in appearance. Enlarged right axillary lymph node measuring 2.7 cm in short axis, similar to the prior study. Other smaller right axillary lymph nodes are also noted. Lungs/Pleura: Chronic mass-like areas of architectural distortion in the perihilar aspect of the right upper lobe and superior segment of the right lower lobe, similar to the prior study, most compatible with chronic postradiation mass-like fibrosis. Moderate right pleural effusion lying dependently. No acute consolidative airspace disease. No pleural effusions. No suspicious appearing pulmonary nodules or masses are noted. Musculoskeletal: There are no aggressive appearing lytic or blastic lesions noted in the visualized portions of the skeleton. CT ABDOMEN PELVIS FINDINGS Hepatobiliary: Ill-defined low-attenuation area in segment 4B of the liver adjacent to the falciform ligament, incompletely characterize, but similar to focal low-attenuation seen on prior study 05/30/2018, most compatible with focal fatty infiltration. No other suspicious cystic or solid hepatic lesions. No intra or extrahepatic biliary ductal dilatation. Gallbladder is contracted around several noncalcified gallstones. No surrounding pericholecystic fluid or inflammatory changes to suggest an acute cholecystitis at this time. Pancreas: No pancreatic mass. No pancreatic ductal dilatation. No pancreatic or peripancreatic fluid collections or inflammatory changes. Spleen: Unremarkable. Adrenals/Urinary Tract: Bilateral kidneys and adrenal glands are normal in appearance. No hydroureteronephrosis. Urinary bladder is normal in appearance. Stomach/Bowel: Normal  appearance of the stomach. No pathologic dilatation of small bowel or colon. The appendix is not confidently identified and may be surgically absent. Regardless, there are no inflammatory changes noted adjacent to the cecum to suggest the presence of an acute appendicitis at this time. Vascular/Lymphatic: Aortic atherosclerosis, without evidence of aneurysm or dissection in the abdominal or pelvic vasculature. Multiple prominent borderline enlarged and mildly enlarged retroperitoneal and pelvic lymph nodes. The largest retroperitoneal lymph nodes measure up to 1 cm in short axis in the aortocaval nodal station (axial image 75 of series 2). The largest pelvic lymph nodes are in the right internal iliac nodal chain measuring up to 1.5 cm in short axis on axial image 103 of series 2 and 109 of series 2. 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 Ovaries are unremarkable in appearance. Other: No significant volume of ascites.  No pneumoperitoneum. Musculoskeletal: There are no aggressive appearing lytic or blastic lesions noted in the visualized portions of the skeleton. IMPRESSION: 1. Persistent low right paratracheal lymphadenopathy, similar to the prior study. This may reflect residual disease. Stable area of presumed postradiation mass-like fibrosis in the perihilar aspect of the right upper lung. 2. Moderate right pleural effusion is similar to the prior examination. 3. Lymphadenopathy also noted in the right axillary region, right hemipelvis and retroperitoneum, as above, potentially reflective of metastatic disease. 4. Focal low-attenuation region in segment 4B of the liver, similar to prior noncontrast CT examination of the abdomen, likely to represent focal fatty infiltration. 5. Cholelithiasis without evidence of acute cholecystitis at this time. 6. Aortic atherosclerosis, in addition to 3 vessel coronary artery disease. Please note that although  the presence of coronary artery calcium  documents the presence of coronary artery disease, the severity of this disease and any potential stenosis cannot be assessed on this non-gated CT examination. Assessment for potential risk factor modification, dietary therapy or pharmacologic therapy may be warranted, if clinically indicated. 7. Additional incidental findings, as above. Electronically Signed   By: Vinnie Langton M.D.   On: 11/29/2018 14:09   Dg Chest Port 1 View  Result Date: 12/12/2018 CLINICAL DATA:  Respiratory failure, metastatic cancer EXAM: PORTABLE CHEST 1 VIEW COMPARISON:  12/11/2018 FINDINGS: No significant change in AP portable examination with layering moderate right pleural and post treatment appearance of the suprahilar right lung. No new airspace opacity. Right chest port catheter. IMPRESSION: No significant change in AP portable examination with layering moderate right pleural and post treatment appearance of the suprahilar right lung. No new airspace opacity. Electronically Signed   By: Eddie Candle M.D.   On: 12/12/2018 12:56   Dg Chest Port 1 View  Result Date: 12/10/2018 CLINICAL DATA:  Shortness of breath. EXAM: PORTABLE CHEST 1 VIEW COMPARISON:  November 16, 2018 FINDINGS: Right injectable port in stable position. The cardiac silhouette is normal. Clear left lung. Increased right pleural effusion with volume loss in the right hemithorax. Stable right perihilar mass. Osseous structures are without acute abnormality. Soft tissues are grossly normal. IMPRESSION: 1. Increased right pleural effusion with volume loss in the right hemithorax. 2. Stable right perihilar mass. Electronically Signed   By: Fidela Salisbury M.D.   On: 12/10/2018 19:16   US Thoracentesis Asp Pleural Space W/img Guide  Result Date: 12/11/2018 INDICATION: Symptomatic right sided pleural effusion EXAM: US THORACENTESIS ASP PLEURAL SPACE W/IMG GUIDE COMPARISON:  None. MEDICATIONS: 10 cc 1% lidocaine  COMPLICATIONS: None immediate. TECHNIQUE: Informed written consent was obtained from the patient after a discussion of the risks, benefits and alternatives to treatment. A timeout was performed prior to the initiation of the procedure. Initial ultrasound scanning demonstrates a right pleural effusion. The lower chest was prepped and draped in the usual sterile fashion. 1% lidocaine was used for local anesthesia. Under direct ultrasound guidance, a 19 gauge, 7-cm, Yueh catheter was introduced. An ultrasound image was saved for documentation purposes. The thoracentesis was performed. The catheter was removed and a dressing was applied. The patient tolerated the procedure well without immediate post procedural complication. The patient was escorted to have an upright chest radiograph. FINDINGS: A total of approximately 400 cc of yellow fluid was removed. Requested samples were sent to the laboratory. IMPRESSION: Successful ultrasound-guided right sided thoracentesis yielding 400 cc of pleural fluid. Read by Lavonia Drafts [facility] Electronically Signed   By: Sandi Mariscal M.D.   On: 12/11/2018 11:09     ASSESSMENT & PLAN:  53 y.o.  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  -Noted to have a small mass in the left adrenal on CT but PET/CT neg for metastatic disease. Patient has been a lifelong nonsmoker -MRI of the brain was negative for any metastatic disease. At diagnosis. -High PDL1 expression (90%) on foundation One Neg for EGFR, ALK, ROS-1 and BRAF mutations. -Patient completed her planned definitive chemoradiation with Carbo Taxol on 02/13/2016. No prohibitive toxicities other than some  grade 1 skin desquamation and  some grade 1-2 radiation esophagitis. She has subsequently received 2 out of 2 planned dose of carboplatin + Taxol. -CTA chest 10/29/2016  no evidence of lung cancer progression in the chest. No PE -CT abd/pelvis-10/29/2016  Interval 5.0 x 5.0 x 4.7 cm mass in the right lateral subcutaneous fat at the level of the mid abdomen. This has CT features compatible with a metastasis or primary neoplasm. -CT C/A/P (05/12/2017): Interval development of right axillary, right external iliac and right inguinal adenopathy. Suspicious for nodal metastasis. 2. Decrease in size of right paratracheal adenopathy. 3. Decrease in size and enhancement associated with right lateral body wall lesion. 4. New small nonspecific pulmonary nodule in the left lower lobe measuring 6 mm. 5. Stable appearance of changes secondary to external beam radiation within the right lung  08/03/17 PET which revealed 2.8 cm right axillary lymph node is markedly hypermetabolic and consistent with metastatic disease. Extensive radiation changes involving the right paramediastinal lung. There is a focus of hypermetabolism in the right lower lobe which is suspicious for residual or recurrent tumor.  11/11/17 CT C/A/P revealed There has been interval increase in number of enlarged of right axillary nodes and increase in size of right inguinal adenopathy compatible with progression of disease. Stable appearance of the right lung with changes of external beam radiation.    03/01/18 CT C/A/P revealed Bulky right subpectoral/axillary adenopathy, markedly progressive from 11/11/2017. Enlarging retrocrural lymph node. Low right paratracheal adenopathy is stable. 2. Interval resection of a right inguinal nodal mass. Stable right external iliac lymph node. 3. Cholelithiasis.  Stable common bile duct dilatation. 4.  Aortic atherosclerosis.  she received maintenance Durvalumab from  05/24/2016 q2weeks, switched to Nivolumab q2weeks on  12/13/16 in the setting of Biopsy proven isolated metastatic disease. Given the tumor is strongly PDL1 positive and patient has only isolated metastatic disease was treated with palliative RT to metastases with significant improvement. Molecular pathology from 515/19 did not reveal obviously targetable mutations with NF1 and TP53 mutations present.  05/24/18 CT Chest revealed Mixed interval change. 2. Moderate dependent right pleural effusion, significantly increased. 3. Right axillary adenopathy is decreased. 4. New left supraclavicular and left posterior mediastinal lymphadenopathy. Increased right retrocrural adenopathy. These findings are worrisome for progression of metastatic nodal disease. 5. New mild patchy nodular consolidation in the peripheral right middle lobe, which could be inflammatory or neoplastic. 6. Similar finely nodular patchy thickening of the peripheral peribronchovascular interstitium and interlobular septa in the lower lungs bilaterally, nonspecific, lymphangitic tumor not excluded. 7. Nonspecific increased right breast skin thickening, possibly treatment related. Aortic Atherosclerosis.  05/30/18 CT Abdomen revealed Cholelithiasis, without associated inflammatory changes to suggest acute cholecystitis. Moderate right pleural effusion. Please note that the pelvis was not imaged.  07/21/18 CT Chest revealed "Progressive left supraclavicular lymphadenopathy. Right axillary, mediastinal and hilar adenopathy is grossly stable, suboptimally evaluated without contrast. These findings remain consistent with metastatic disease. 2. Slight enlargement of moderate-sized right pleural effusion with extension into the minor fissure. This limits evaluation of the previously demonstrated right middle lobe nodular density. This effusion could be malignant. No discretely enlarging pulmonary nodules identified. 3. Stable skin thickening in the right breast. 4. PET CT follow-up may be helpful to assess for  hypermetabolism in the lymph nodes and right pleural space."  Pt was previously seen to have progression through immunotherapy treatment and have been holding Nivolumab  09/14/18 CT Chest revealed "Persistent right axillary, mediastinal, and left supraclavicular adenopathy. Stable to mild increase in size of these lymph  nodes period. 2. Stable to mild decrease in size of moderate size right pleural effusion. 3. Stable skin thickening of the right breast." S/p 09/15/18 Thoracentesis removed 1L of fluid 09/15/18 Cytology revealed malignant cells in the removed pleural fluid  11/16/2018 Pleural Fluid Biopsy ([id number]) with results revealing "malignant cells consistent with metastatic carcinoma"  11/29/2018 NM Bone Scan Whole Body ([id number]) revealed "Questionable foci of increased tracer accumulation in the calvaria and at the distal sternum/xiphoid process, uncertain etiology, cannot exclude osseous metastatic disease. No other worrisome sites of osseous tracer accumulation are identified. Decreased tracer uptake at the thoracic spine and at upper LEFT ribs, nonspecific, can be seen at radiation therapy ports recommend correlation with patient history. RIGHT pleural effusion."  11/29/2018 CT C/A/P w/contrast ([id number]) ([id number]) revealed "1. Persistent low right paratracheal lymphadenopathy, similar to the prior study. This may reflect residual disease. Stable area of presumed postradiation mass-like fibrosis in the perihilar aspect of the right upper lung. 2. Moderate right pleural effusion is similar to the prior examination. 3. Lymphadenopathy also noted in the right axillary region, right hemipelvis and retroperitoneum, as above, potentially reflective of metastatic disease. 4. Focal low-attenuation region in segment 4B of the liver, similar to prior noncontrast CT examination of the abdomen, likely to represent focal fatty infiltration. 5. Cholelithiasis without evidence of acute cholecystitis at  this time. 6. Aortic atherosclerosis, in addition to 3 vessel coronary artery disease. Please note that although the presence of coronary artery calcium documents the presence of coronary artery disease, the severity of this disease and any potential stenosis cannot be assessed on this non-gated CT examination. Assessment for potential risk factor modification, dietary therapy or pharmacologic therapy may be warranted, if clinically indicated. 7. Additional incidental findings, as above."  #3 h/o radiation pneumonitis - no new symptoms. Does has some cough with exertion but this has improved.  #4 Improving fatigue (grade 1) - improved fatigue with levothyroxine compliance with near normalization of FT4 levels.  - previously given TSH of 12 -- increased Levothyroxine from 111mg to 1576m  ---will likely need larger dose but in the setting of baseline tachycardia we are gradually increasing dose to avoid ppt cardiac arrhythmias.  TSH elevated due to medication non compliance. TSH 6.24, FT4 0.6  #5 normocytic anemia related to chemotherapy -resolving and stable  #6 Left chest wall pain due to costochondritis - mx with pain medications. CTA x 3 neg for PE. No significant pain at this time.  #7 Sinus tachycardia - on BB per cards , no PE on CTA x 3 , -Still having sinus tachycardia . Partly from deconditioning and anemia. Seeing Dr. HiDebara Pickettrom cardiology  -Continues to be on BB  #8 neoplasm related pain is currently controlled without significant medications. Clinically likely has sleep apnea . Would need to be careful with sedative medications . 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 -Discussed other chemotherapy options and the possibility of using hospice care. Pt is not ready to consider hospice care.  -Will hold Carboplatin -Will  proceed with maintenance Alimta + Avastin -Pt has no prohibitive toxicities from continuing C5D1 of Alimta & Avastin -Advised pt that not taking her insulin as prescribed can contribute to her lack of appetite and nausea -Recommended 150 mL Magnesium Citrate for constipation  -WIll need to monitor for increased SOB and consider rpt therapeutic thoracentesis as needed -Will see back in 3 weeks with labs   FOLLOW UP: RTC in 3 weeks with Alimta/Avastin as ordered, labs and MD visit  The total time spent in the appt was 25 minutes and more than 50% was on counseling and direct patient cares.  All of the patient's questions were answered with apparent satisfaction. The patient knows to call the clinic with any problems, questions or concerns.   Sullivan Lone MD [place] AAHIVMS [facility] Hematology/Oncology Physician [facility]  (Office):       [phone] (Work cell):  [phone] (Fax):           [phone]  I, Yevette Edwards, am acting as a scribe for Dr. Sullivan Lone.   .I have reviewed the above documentation for accuracy and completeness, and I agree with the above. [name] MD

## 2018-12-26 DIAGNOSIS — J910 Malignant pleural effusion: Secondary | ICD-10-CM | POA: Diagnosis not present

## 2018-12-26 DIAGNOSIS — C792 Secondary malignant neoplasm of skin: Secondary | ICD-10-CM | POA: Diagnosis not present

## 2018-12-26 DIAGNOSIS — T451X5D Adverse effect of antineoplastic and immunosuppressive drugs, subsequent encounter: Secondary | ICD-10-CM | POA: Diagnosis not present

## 2018-12-26 DIAGNOSIS — J9601 Acute respiratory failure with hypoxia: Secondary | ICD-10-CM | POA: Diagnosis not present

## 2018-12-26 DIAGNOSIS — G620 Drug-induced polyneuropathy: Secondary | ICD-10-CM | POA: Diagnosis not present

## 2018-12-26 DIAGNOSIS — C3491 Malignant neoplasm of unspecified part of right bronchus or lung: Secondary | ICD-10-CM | POA: Diagnosis not present

## 2018-12-27 ENCOUNTER — Inpatient Hospital Stay: Payer: Medicare Other

## 2018-12-27 ENCOUNTER — Other Ambulatory Visit: Payer: Self-pay

## 2018-12-27 ENCOUNTER — Inpatient Hospital Stay (HOSPITAL_BASED_OUTPATIENT_CLINIC_OR_DEPARTMENT_OTHER): Payer: Medicare Other | Admitting: Hematology

## 2018-12-27 VITALS — BP 126/91 | HR 104 | Temp 97.8°F | Resp 18 | Ht 69.0 in | Wt 264.2 lb

## 2018-12-27 VITALS — HR 99

## 2018-12-27 DIAGNOSIS — K5900 Constipation, unspecified: Secondary | ICD-10-CM | POA: Diagnosis not present

## 2018-12-27 DIAGNOSIS — J910 Malignant pleural effusion: Secondary | ICD-10-CM | POA: Diagnosis not present

## 2018-12-27 DIAGNOSIS — E039 Hypothyroidism, unspecified: Secondary | ICD-10-CM | POA: Diagnosis not present

## 2018-12-27 DIAGNOSIS — C3490 Malignant neoplasm of unspecified part of unspecified bronchus or lung: Secondary | ICD-10-CM

## 2018-12-27 DIAGNOSIS — I428 Other cardiomyopathies: Secondary | ICD-10-CM | POA: Diagnosis not present

## 2018-12-27 DIAGNOSIS — M129 Arthropathy, unspecified: Secondary | ICD-10-CM | POA: Diagnosis not present

## 2018-12-27 DIAGNOSIS — R112 Nausea with vomiting, unspecified: Secondary | ICD-10-CM | POA: Diagnosis not present

## 2018-12-27 DIAGNOSIS — G629 Polyneuropathy, unspecified: Secondary | ICD-10-CM | POA: Diagnosis not present

## 2018-12-27 DIAGNOSIS — Z7189 Other specified counseling: Secondary | ICD-10-CM

## 2018-12-27 DIAGNOSIS — Z95828 Presence of other vascular implants and grafts: Secondary | ICD-10-CM

## 2018-12-27 DIAGNOSIS — Z5112 Encounter for antineoplastic immunotherapy: Secondary | ICD-10-CM | POA: Diagnosis not present

## 2018-12-27 DIAGNOSIS — D6481 Anemia due to antineoplastic chemotherapy: Secondary | ICD-10-CM | POA: Diagnosis not present

## 2018-12-27 DIAGNOSIS — C3491 Malignant neoplasm of unspecified part of right bronchus or lung: Secondary | ICD-10-CM

## 2018-12-27 DIAGNOSIS — F17210 Nicotine dependence, cigarettes, uncomplicated: Secondary | ICD-10-CM | POA: Diagnosis not present

## 2018-12-27 DIAGNOSIS — G893 Neoplasm related pain (acute) (chronic): Secondary | ICD-10-CM | POA: Diagnosis not present

## 2018-12-27 DIAGNOSIS — I700 Atherosclerosis of aorta: Secondary | ICD-10-CM | POA: Diagnosis not present

## 2018-12-27 DIAGNOSIS — R630 Anorexia: Secondary | ICD-10-CM | POA: Diagnosis not present

## 2018-12-27 DIAGNOSIS — E7800 Pure hypercholesterolemia, unspecified: Secondary | ICD-10-CM | POA: Diagnosis not present

## 2018-12-27 DIAGNOSIS — I2510 Atherosclerotic heart disease of native coronary artery without angina pectoris: Secondary | ICD-10-CM | POA: Diagnosis not present

## 2018-12-27 DIAGNOSIS — R000 Tachycardia, unspecified: Secondary | ICD-10-CM | POA: Diagnosis not present

## 2018-12-27 DIAGNOSIS — J90 Pleural effusion, not elsewhere classified: Secondary | ICD-10-CM | POA: Diagnosis not present

## 2018-12-27 DIAGNOSIS — Z79899 Other long term (current) drug therapy: Secondary | ICD-10-CM | POA: Diagnosis not present

## 2018-12-27 DIAGNOSIS — Z794 Long term (current) use of insulin: Secondary | ICD-10-CM | POA: Diagnosis not present

## 2018-12-27 DIAGNOSIS — Z452 Encounter for adjustment and management of vascular access device: Secondary | ICD-10-CM

## 2018-12-27 DIAGNOSIS — I10 Essential (primary) hypertension: Secondary | ICD-10-CM | POA: Diagnosis not present

## 2018-12-27 DIAGNOSIS — R5383 Other fatigue: Secondary | ICD-10-CM | POA: Diagnosis not present

## 2018-12-27 DIAGNOSIS — E1165 Type 2 diabetes mellitus with hyperglycemia: Secondary | ICD-10-CM | POA: Diagnosis not present

## 2018-12-27 DIAGNOSIS — Z5111 Encounter for antineoplastic chemotherapy: Secondary | ICD-10-CM | POA: Diagnosis not present

## 2018-12-27 DIAGNOSIS — T451X5A Adverse effect of antineoplastic and immunosuppressive drugs, initial encounter: Secondary | ICD-10-CM | POA: Diagnosis not present

## 2018-12-27 LAB — CBC WITH DIFFERENTIAL/PLATELET
Abs Immature Granulocytes: 0.02 10*3/uL (ref 0.00–0.07)
Basophils Absolute: 0 10*3/uL (ref 0.0–0.1)
Basophils Relative: 0 %
Eosinophils Absolute: 0.1 10*3/uL (ref 0.0–0.5)
Eosinophils Relative: 1 %
HCT: 27.1 % — ABNORMAL LOW (ref 36.0–46.0)
Hemoglobin: 8.9 g/dL — ABNORMAL LOW (ref 12.0–15.0)
Immature Granulocytes: 0 %
Lymphocytes Relative: 14 %
Lymphs Abs: 0.8 10*3/uL (ref 0.7–4.0)
MCH: 33.3 pg (ref 26.0–34.0)
MCHC: 32.8 g/dL (ref 30.0–36.0)
MCV: 101.5 fL — ABNORMAL HIGH (ref 80.0–100.0)
Monocytes Absolute: 0.6 10*3/uL (ref 0.1–1.0)
Monocytes Relative: 10 %
Neutro Abs: 4.3 10*3/uL (ref 1.7–7.7)
Neutrophils Relative %: 75 %
Platelets: 217 10*3/uL (ref 150–400)
RBC: 2.67 MIL/uL — ABNORMAL LOW (ref 3.87–5.11)
RDW: 21.8 % — ABNORMAL HIGH (ref 11.5–15.5)
WBC: 5.8 10*3/uL (ref 4.0–10.5)
nRBC: 0.3 % — ABNORMAL HIGH (ref 0.0–0.2)

## 2018-12-27 LAB — CMP (CANCER CENTER ONLY)
ALT: 10 U/L (ref 0–44)
AST: 17 U/L (ref 15–41)
Albumin: 3 g/dL — ABNORMAL LOW (ref 3.5–5.0)
Alkaline Phosphatase: 100 U/L (ref 38–126)
Anion gap: 9 (ref 5–15)
BUN: 11 mg/dL (ref 6–20)
CO2: 28 mmol/L (ref 22–32)
Calcium: 8.6 mg/dL — ABNORMAL LOW (ref 8.9–10.3)
Chloride: 103 mmol/L (ref 98–111)
Creatinine: 1 mg/dL (ref 0.44–1.00)
GFR, Est AFR Am: >60 mL/min (ref >60)
GFR, Estimated: >60 mL/min (ref >60)
Glucose, Bld: 202 mg/dL — ABNORMAL HIGH (ref 70–99)
Potassium: 4.5 mmol/L (ref 3.5–5.1)
Sodium: 140 mmol/L (ref 135–145)
Total Bilirubin: 0.3 mg/dL (ref 0.3–1.2)
Total Protein: 7.6 g/dL (ref 6.5–8.1)

## 2018-12-27 LAB — TOTAL PROTEIN, URINE DIPSTICK: Protein, ur: 30 mg/dL — AB

## 2018-12-27 MED ORDER — DIPHENHYDRAMINE HCL 50 MG/ML IJ SOLN
INTRAMUSCULAR | Status: AC
  Filled 2018-12-27: qty 1

## 2018-12-27 MED ORDER — SODIUM CHLORIDE 0.9% FLUSH
10.0000 mL | Freq: Once | INTRAVENOUS | Status: DC
  Filled 2018-12-27: qty 10

## 2018-12-27 MED ORDER — FAMOTIDINE IN NACL 20-0.9 MG/50ML-% IV SOLN
20.0000 mg | Freq: Once | INTRAVENOUS | Status: AC
  Administered 2018-12-27: 11:00:00 20 mg via INTRAVENOUS

## 2018-12-27 MED ORDER — SODIUM CHLORIDE 0.9% FLUSH
10.0000 mL | INTRAVENOUS | Status: DC | PRN
  Administered 2018-12-27: 09:00:00 10 mL via INTRAVENOUS
  Filled 2018-12-27: qty 10

## 2018-12-27 MED ORDER — CYANOCOBALAMIN 1000 MCG/ML IJ SOLN
INTRAMUSCULAR | Status: AC
  Filled 2018-12-27: qty 1

## 2018-12-27 MED ORDER — HEPARIN SOD (PORK) LOCK FLUSH 100 UNIT/ML IV SOLN
500.0000 [IU] | Freq: Once | INTRAVENOUS | Status: AC | PRN
  Administered 2018-12-27: 500 [IU]
  Filled 2018-12-27: qty 5

## 2018-12-27 MED ORDER — ALTEPLASE 2 MG IJ SOLR
INTRAMUSCULAR | Status: AC
  Filled 2018-12-27: qty 2

## 2018-12-27 MED ORDER — SODIUM CHLORIDE 0.9 % IV SOLN
Freq: Once | INTRAVENOUS | Status: AC
  Administered 2018-12-27: 11:00:00 via INTRAVENOUS
  Filled 2018-12-27: qty 250

## 2018-12-27 MED ORDER — SODIUM CHLORIDE 0.9 % IV SOLN
1800.0000 mg | Freq: Once | INTRAVENOUS | Status: AC
  Administered 2018-12-27: 12:00:00 1800 mg via INTRAVENOUS
  Filled 2018-12-27: qty 64

## 2018-12-27 MED ORDER — CYANOCOBALAMIN 1000 MCG/ML IJ SOLN
1000.0000 ug | Freq: Once | INTRAMUSCULAR | Status: AC
  Administered 2018-12-27: 1000 ug via SUBCUTANEOUS

## 2018-12-27 MED ORDER — PROCHLORPERAZINE MALEATE 10 MG PO TABS
10.0000 mg | ORAL_TABLET | Freq: Once | ORAL | Status: AC
  Administered 2018-12-27: 10 mg via ORAL

## 2018-12-27 MED ORDER — ALTEPLASE 2 MG IJ SOLR
2.0000 mg | Freq: Once | INTRAMUSCULAR | Status: AC | PRN
  Administered 2018-12-27: 2 mg
  Filled 2018-12-27: qty 2

## 2018-12-27 MED ORDER — SODIUM CHLORIDE 0.9% FLUSH
10.0000 mL | INTRAVENOUS | Status: DC | PRN
  Administered 2018-12-27: 10 mL
  Filled 2018-12-27: qty 10

## 2018-12-27 MED ORDER — DIPHENHYDRAMINE HCL 50 MG/ML IJ SOLN
25.0000 mg | Freq: Once | INTRAMUSCULAR | Status: AC
  Administered 2018-12-27: 25 mg via INTRAVENOUS

## 2018-12-27 MED ORDER — FAMOTIDINE IN NACL 20-0.9 MG/50ML-% IV SOLN
INTRAVENOUS | Status: AC
  Filled 2018-12-27: qty 50

## 2018-12-27 MED ORDER — PROCHLORPERAZINE MALEATE 10 MG PO TABS
ORAL_TABLET | ORAL | Status: AC
  Filled 2018-12-27: qty 1

## 2018-12-27 MED ORDER — SODIUM CHLORIDE 0.9 % IV SOLN
1000.0000 mg | Freq: Once | INTRAVENOUS | Status: AC
  Administered 2018-12-27: 13:00:00 1000 mg via INTRAVENOUS
  Filled 2018-12-27: qty 40

## 2018-12-27 NOTE — Patient Instructions (Signed)
[facility] Discharge Instructions for Patients Receiving Chemotherapy  Today you received the following chemotherapy agents Alimta/Avastin.  To help prevent nausea and vomiting after your treatment, we encourage you to take your nausea medication as directed.   If you develop nausea and vomiting that is not controlled by your nausea medication, call the clinic.   BELOW ARE SYMPTOMS THAT SHOULD BE REPORTED IMMEDIATELY:  *FEVER GREATER THAN 100.5 F  *CHILLS WITH OR WITHOUT FEVER  NAUSEA AND VOMITING THAT IS NOT CONTROLLED WITH YOUR NAUSEA MEDICATION  *UNUSUAL SHORTNESS OF BREATH  *UNUSUAL BRUISING OR BLEEDING  TENDERNESS IN MOUTH AND THROAT WITH OR WITHOUT PRESENCE OF ULCERS  *URINARY PROBLEMS  *BOWEL PROBLEMS  UNUSUAL RASH Items with * indicate a potential emergency and should be followed up as soon as possible.  Feel free to call the clinic should you have any questions or concerns. The clinic phone number is [phone].  Please show the [place] at check-in to the Emergency Department and triage nurse.

## 2018-12-28 ENCOUNTER — Telehealth: Payer: Self-pay | Admitting: Hematology

## 2018-12-28 DIAGNOSIS — T451X5D Adverse effect of antineoplastic and immunosuppressive drugs, subsequent encounter: Secondary | ICD-10-CM | POA: Diagnosis not present

## 2018-12-28 DIAGNOSIS — C3491 Malignant neoplasm of unspecified part of right bronchus or lung: Secondary | ICD-10-CM | POA: Diagnosis not present

## 2018-12-28 DIAGNOSIS — C792 Secondary malignant neoplasm of skin: Secondary | ICD-10-CM | POA: Diagnosis not present

## 2018-12-28 DIAGNOSIS — J910 Malignant pleural effusion: Secondary | ICD-10-CM | POA: Diagnosis not present

## 2018-12-28 DIAGNOSIS — J9601 Acute respiratory failure with hypoxia: Secondary | ICD-10-CM | POA: Diagnosis not present

## 2018-12-28 DIAGNOSIS — G620 Drug-induced polyneuropathy: Secondary | ICD-10-CM | POA: Diagnosis not present

## 2018-12-28 NOTE — Telephone Encounter (Signed)
Scheduled appt per 9/23 los. ° °Spoke with patient and she is aware of the appt date and time. °

## 2018-12-29 ENCOUNTER — Telehealth: Payer: Self-pay | Admitting: *Deleted

## 2018-12-29 NOTE — Telephone Encounter (Signed)
Patient called - states Dr.Kale said he was going to write a prescription for her for Ensure. She says if he will send the prescription to the pharmacy, they will deliver it. She also states that Dr. Irene Limbo told her he would order her a hospital bed. Advised her that no information regarding hospital bed in his note. Dr. Irene Limbo given information and patient requests.

## 2019-01-01 ENCOUNTER — Emergency Department (HOSPITAL_COMMUNITY): Payer: Medicare Other

## 2019-01-01 ENCOUNTER — Inpatient Hospital Stay (HOSPITAL_COMMUNITY)
Admission: EM | Admit: 2019-01-01 | Discharge: 2019-01-20 | DRG: 871 | Disposition: A | Discharge status: Home Health | Payer: Medicare Other | Attending: Internal Medicine | Admitting: Internal Medicine

## 2019-01-01 ENCOUNTER — Other Ambulatory Visit: Payer: Self-pay

## 2019-01-01 ENCOUNTER — Observation Stay (HOSPITAL_COMMUNITY): Payer: Medicare Other

## 2019-01-01 ENCOUNTER — Encounter (HOSPITAL_COMMUNITY): Payer: Self-pay | Admitting: Emergency Medicine

## 2019-01-01 DIAGNOSIS — E1165 Type 2 diabetes mellitus with hyperglycemia: Secondary | ICD-10-CM | POA: Diagnosis present

## 2019-01-01 DIAGNOSIS — Z79899 Other long term (current) drug therapy: Secondary | ICD-10-CM

## 2019-01-01 DIAGNOSIS — N390 Urinary tract infection, site not specified: Secondary | ICD-10-CM | POA: Diagnosis not present

## 2019-01-01 DIAGNOSIS — M1990 Unspecified osteoarthritis, unspecified site: Secondary | ICD-10-CM | POA: Diagnosis present

## 2019-01-01 DIAGNOSIS — Z1612 Extended spectrum beta lactamase (ESBL) resistance: Secondary | ICD-10-CM | POA: Diagnosis present

## 2019-01-01 DIAGNOSIS — K8050 Calculus of bile duct without cholangitis or cholecystitis without obstruction: Secondary | ICD-10-CM

## 2019-01-01 DIAGNOSIS — Z794 Long term (current) use of insulin: Secondary | ICD-10-CM

## 2019-01-01 DIAGNOSIS — I10 Essential (primary) hypertension: Secondary | ICD-10-CM | POA: Diagnosis present

## 2019-01-01 DIAGNOSIS — C3492 Malignant neoplasm of unspecified part of left bronchus or lung: Secondary | ICD-10-CM | POA: Diagnosis present

## 2019-01-01 DIAGNOSIS — R000 Tachycardia, unspecified: Secondary | ICD-10-CM | POA: Diagnosis not present

## 2019-01-01 DIAGNOSIS — J910 Malignant pleural effusion: Secondary | ICD-10-CM | POA: Diagnosis not present

## 2019-01-01 DIAGNOSIS — C7989 Secondary malignant neoplasm of other specified sites: Secondary | ICD-10-CM | POA: Diagnosis present

## 2019-01-01 DIAGNOSIS — Z8701 Personal history of pneumonia (recurrent): Secondary | ICD-10-CM

## 2019-01-01 DIAGNOSIS — D701 Agranulocytosis secondary to cancer chemotherapy: Secondary | ICD-10-CM | POA: Diagnosis present

## 2019-01-01 DIAGNOSIS — J90 Pleural effusion, not elsewhere classified: Secondary | ICD-10-CM | POA: Diagnosis not present

## 2019-01-01 DIAGNOSIS — E1140 Type 2 diabetes mellitus with diabetic neuropathy, unspecified: Secondary | ICD-10-CM | POA: Diagnosis present

## 2019-01-01 DIAGNOSIS — T451X5A Adverse effect of antineoplastic and immunosuppressive drugs, initial encounter: Secondary | ICD-10-CM

## 2019-01-01 DIAGNOSIS — C7801 Secondary malignant neoplasm of right lung: Secondary | ICD-10-CM | POA: Diagnosis present

## 2019-01-01 DIAGNOSIS — Z7952 Long term (current) use of systemic steroids: Secondary | ICD-10-CM

## 2019-01-01 DIAGNOSIS — Z20828 Contact with and (suspected) exposure to other viral communicable diseases: Secondary | ICD-10-CM | POA: Diagnosis not present

## 2019-01-01 DIAGNOSIS — A4151 Sepsis due to Escherichia coli [E. coli]: Principal | ICD-10-CM | POA: Diagnosis present

## 2019-01-01 DIAGNOSIS — Z79890 Hormone replacement therapy (postmenopausal): Secondary | ICD-10-CM

## 2019-01-01 DIAGNOSIS — Z9689 Presence of other specified functional implants: Secondary | ICD-10-CM

## 2019-01-01 DIAGNOSIS — Y842 Radiological procedure and radiotherapy as the cause of abnormal reaction of the patient, or of later complication, without mention of misadventure at the time of the procedure: Secondary | ICD-10-CM | POA: Diagnosis present

## 2019-01-01 DIAGNOSIS — I5032 Chronic diastolic (congestive) heart failure: Secondary | ICD-10-CM | POA: Diagnosis present

## 2019-01-01 DIAGNOSIS — Z7982 Long term (current) use of aspirin: Secondary | ICD-10-CM

## 2019-01-01 DIAGNOSIS — K208 Other esophagitis without bleeding: Secondary | ICD-10-CM | POA: Diagnosis present

## 2019-01-01 DIAGNOSIS — K8020 Calculus of gallbladder without cholecystitis without obstruction: Secondary | ICD-10-CM | POA: Diagnosis not present

## 2019-01-01 DIAGNOSIS — R0602 Shortness of breath: Secondary | ICD-10-CM

## 2019-01-01 DIAGNOSIS — K811 Chronic cholecystitis: Secondary | ICD-10-CM

## 2019-01-01 DIAGNOSIS — F329 Major depressive disorder, single episode, unspecified: Secondary | ICD-10-CM | POA: Diagnosis present

## 2019-01-01 DIAGNOSIS — R109 Unspecified abdominal pain: Secondary | ICD-10-CM

## 2019-01-01 DIAGNOSIS — Z95828 Presence of other vascular implants and grafts: Secondary | ICD-10-CM

## 2019-01-01 DIAGNOSIS — C792 Secondary malignant neoplasm of skin: Secondary | ICD-10-CM | POA: Diagnosis present

## 2019-01-01 DIAGNOSIS — I428 Other cardiomyopathies: Secondary | ICD-10-CM | POA: Diagnosis present

## 2019-01-01 DIAGNOSIS — F32A Depression, unspecified: Secondary | ICD-10-CM | POA: Diagnosis present

## 2019-01-01 DIAGNOSIS — Z923 Personal history of irradiation: Secondary | ICD-10-CM

## 2019-01-01 DIAGNOSIS — Z8249 Family history of ischemic heart disease and other diseases of the circulatory system: Secondary | ICD-10-CM

## 2019-01-01 DIAGNOSIS — J45909 Unspecified asthma, uncomplicated: Secondary | ICD-10-CM | POA: Diagnosis present

## 2019-01-01 DIAGNOSIS — B9629 Other Escherichia coli [E. coli] as the cause of diseases classified elsewhere: Secondary | ICD-10-CM

## 2019-01-01 DIAGNOSIS — Z9889 Other specified postprocedural states: Secondary | ICD-10-CM

## 2019-01-01 DIAGNOSIS — I5033 Acute on chronic diastolic (congestive) heart failure: Secondary | ICD-10-CM | POA: Diagnosis not present

## 2019-01-01 DIAGNOSIS — R0600 Dyspnea, unspecified: Secondary | ICD-10-CM

## 2019-01-01 DIAGNOSIS — R52 Pain, unspecified: Secondary | ICD-10-CM | POA: Diagnosis not present

## 2019-01-01 DIAGNOSIS — D6481 Anemia due to antineoplastic chemotherapy: Secondary | ICD-10-CM | POA: Diagnosis present

## 2019-01-01 DIAGNOSIS — C3490 Malignant neoplasm of unspecified part of unspecified bronchus or lung: Secondary | ICD-10-CM | POA: Diagnosis not present

## 2019-01-01 DIAGNOSIS — K8012 Calculus of gallbladder with acute and chronic cholecystitis without obstruction: Secondary | ICD-10-CM | POA: Diagnosis present

## 2019-01-01 DIAGNOSIS — G8929 Other chronic pain: Secondary | ICD-10-CM | POA: Diagnosis present

## 2019-01-01 DIAGNOSIS — Z6839 Body mass index (BMI) 39.0-39.9, adult: Secondary | ICD-10-CM

## 2019-01-01 DIAGNOSIS — E119 Type 2 diabetes mellitus without complications: Secondary | ICD-10-CM

## 2019-01-01 DIAGNOSIS — E039 Hypothyroidism, unspecified: Secondary | ICD-10-CM | POA: Diagnosis present

## 2019-01-01 DIAGNOSIS — R1084 Generalized abdominal pain: Secondary | ICD-10-CM | POA: Diagnosis not present

## 2019-01-01 DIAGNOSIS — D62 Acute posthemorrhagic anemia: Secondary | ICD-10-CM | POA: Diagnosis not present

## 2019-01-01 DIAGNOSIS — R319 Hematuria, unspecified: Secondary | ICD-10-CM

## 2019-01-01 DIAGNOSIS — E785 Hyperlipidemia, unspecified: Secondary | ICD-10-CM | POA: Diagnosis present

## 2019-01-01 DIAGNOSIS — D61818 Other pancytopenia: Secondary | ICD-10-CM | POA: Diagnosis present

## 2019-01-01 DIAGNOSIS — K810 Acute cholecystitis: Secondary | ICD-10-CM

## 2019-01-01 DIAGNOSIS — E876 Hypokalemia: Secondary | ICD-10-CM | POA: Diagnosis present

## 2019-01-01 DIAGNOSIS — I110 Hypertensive heart disease with heart failure: Secondary | ICD-10-CM | POA: Diagnosis present

## 2019-01-01 DIAGNOSIS — K838 Other specified diseases of biliary tract: Secondary | ICD-10-CM | POA: Diagnosis not present

## 2019-01-01 DIAGNOSIS — D638 Anemia in other chronic diseases classified elsewhere: Secondary | ICD-10-CM | POA: Diagnosis present

## 2019-01-01 LAB — COMPREHENSIVE METABOLIC PANEL
ALT: 17 U/L (ref 0–44)
ALT: 16 U/L (ref 0–44)
AST: 27 U/L (ref 15–41)
AST: 25 U/L (ref 15–41)
Albumin: 2.8 g/dL — ABNORMAL LOW (ref 3.5–5.0)
Albumin: 2.7 g/dL — ABNORMAL LOW (ref 3.5–5.0)
Alkaline Phosphatase: 84 U/L (ref 38–126)
Alkaline Phosphatase: 80 U/L (ref 38–126)
Anion gap: 12 (ref 5–15)
Anion gap: 11 (ref 5–15)
BUN: 7 mg/dL (ref 6–20)
BUN: 6 mg/dL (ref 6–20)
CO2: 20 mmol/L — ABNORMAL LOW (ref 22–32)
CO2: 21 mmol/L — ABNORMAL LOW (ref 22–32)
Calcium: 7.8 mg/dL — ABNORMAL LOW (ref 8.9–10.3)
Calcium: 7.5 mg/dL — ABNORMAL LOW (ref 8.9–10.3)
Chloride: 101 mmol/L (ref 98–111)
Chloride: 105 mmol/L (ref 98–111)
Creatinine, Ser: 0.88 mg/dL (ref 0.44–1.00)
Creatinine, Ser: 0.82 mg/dL (ref 0.44–1.00)
GFR calc Af Amer: >60 mL/min (ref >60)
GFR calc Af Amer: >60 mL/min (ref >60)
GFR calc non Af Amer: >60 mL/min (ref >60)
GFR calc non Af Amer: >60 mL/min (ref >60)
Glucose, Bld: 326 mg/dL — ABNORMAL HIGH (ref 70–99)
Glucose, Bld: 159 mg/dL — ABNORMAL HIGH (ref 70–99)
Potassium: 3.6 mmol/L (ref 3.5–5.1)
Potassium: 3.5 mmol/L (ref 3.5–5.1)
Sodium: 133 mmol/L — ABNORMAL LOW (ref 135–145)
Sodium: 137 mmol/L (ref 135–145)
Total Bilirubin: 0.5 mg/dL (ref 0.3–1.2)
Total Bilirubin: 0.6 mg/dL (ref 0.3–1.2)
Total Protein: 7.3 g/dL (ref 6.5–8.1)
Total Protein: 6.9 g/dL (ref 6.5–8.1)

## 2019-01-01 LAB — GLUCOSE, CAPILLARY
Glucose-Capillary: 134 mg/dL — ABNORMAL HIGH (ref 70–99)
Glucose-Capillary: 144 mg/dL — ABNORMAL HIGH (ref 70–99)

## 2019-01-01 LAB — CBC WITH DIFFERENTIAL/PLATELET
Abs Immature Granulocytes: 0.01 10*3/uL (ref 0.00–0.07)
Basophils Absolute: 0 10*3/uL (ref 0.0–0.1)
Basophils Relative: 0 %
Eosinophils Absolute: 0 10*3/uL (ref 0.0–0.5)
Eosinophils Relative: 0 %
HCT: 21.5 % — ABNORMAL LOW (ref 36.0–46.0)
Hemoglobin: 6.8 g/dL — CL (ref 12.0–15.0)
Immature Granulocytes: 0 %
Lymphocytes Relative: 16 %
Lymphs Abs: 0.6 10*3/uL — ABNORMAL LOW (ref 0.7–4.0)
MCH: 33.7 pg (ref 26.0–34.0)
MCHC: 31.6 g/dL (ref 30.0–36.0)
MCV: 106.4 fL — ABNORMAL HIGH (ref 80.0–100.0)
Monocytes Absolute: 0.1 10*3/uL (ref 0.1–1.0)
Monocytes Relative: 3 %
Neutro Abs: 2.9 10*3/uL (ref 1.7–7.7)
Neutrophils Relative %: 81 %
Platelets: 183 10*3/uL (ref 150–400)
RBC: 2.02 MIL/uL — ABNORMAL LOW (ref 3.87–5.11)
RDW: 20.3 % — ABNORMAL HIGH (ref 11.5–15.5)
WBC: 3.5 10*3/uL — ABNORMAL LOW (ref 4.0–10.5)
nRBC: 0 % (ref 0.0–0.2)

## 2019-01-01 LAB — URINALYSIS, ROUTINE W REFLEX MICROSCOPIC
Bacteria, UA: NONE SEEN
Bilirubin Urine: NEGATIVE
Glucose, UA: 150 mg/dL — AB
Hgb urine dipstick: NEGATIVE
Ketones, ur: NEGATIVE mg/dL
Leukocytes,Ua: NEGATIVE
Nitrite: NEGATIVE
Protein, ur: 30 mg/dL — AB
Specific Gravity, Urine: 1.028 (ref 1.005–1.030)
pH: 6 (ref 5.0–8.0)

## 2019-01-01 LAB — CBC
HCT: 22.5 % — ABNORMAL LOW (ref 36.0–46.0)
Hemoglobin: 7.3 g/dL — ABNORMAL LOW (ref 12.0–15.0)
MCH: 34.4 pg — ABNORMAL HIGH (ref 26.0–34.0)
MCHC: 32.4 g/dL (ref 30.0–36.0)
MCV: 106.1 fL — ABNORMAL HIGH (ref 80.0–100.0)
Platelets: 211 10*3/uL (ref 150–400)
RBC: 2.12 MIL/uL — ABNORMAL LOW (ref 3.87–5.11)
RDW: 20.3 % — ABNORMAL HIGH (ref 11.5–15.5)
WBC: 4.7 10*3/uL (ref 4.0–10.5)
nRBC: 0 % (ref 0.0–0.2)

## 2019-01-01 LAB — I-STAT BETA HCG BLOOD, ED (MC, WL, AP ONLY): I-stat hCG, quantitative: <5 m[IU]/mL (ref <5)

## 2019-01-01 LAB — PROTIME-INR
INR: 1.1 (ref 0.8–1.2)
Prothrombin Time: 14.5 seconds (ref 11.4–15.2)

## 2019-01-01 LAB — SARS CORONAVIRUS 2 (TAT 6-24 HRS): SARS Coronavirus 2: NEGATIVE

## 2019-01-01 LAB — TSH: TSH: 41.681 u[IU]/mL — ABNORMAL HIGH (ref 0.350–4.500)

## 2019-01-01 LAB — VITAMIN B12: Vitamin B-12: 2017 pg/mL — ABNORMAL HIGH (ref 180–914)

## 2019-01-01 LAB — PROCALCITONIN: Procalcitonin: 0.15 ng/mL

## 2019-01-01 LAB — TROPONIN I (HIGH SENSITIVITY)
Troponin I (High Sensitivity): 11 ng/L (ref <18)
Troponin I (High Sensitivity): 11 ng/L (ref <18)

## 2019-01-01 LAB — APTT: aPTT: 28 seconds (ref 24–36)

## 2019-01-01 LAB — LACTIC ACID, PLASMA
Lactic Acid, Venous: 1 mmol/L (ref 0.5–1.9)
Lactic Acid, Venous: 1 mmol/L (ref 0.5–1.9)

## 2019-01-01 LAB — LIPASE, BLOOD: Lipase: 14 U/L (ref 11–51)

## 2019-01-01 LAB — CBG MONITORING, ED: Glucose-Capillary: 151 mg/dL — ABNORMAL HIGH (ref 70–99)

## 2019-01-01 MED ORDER — PIPERACILLIN-TAZOBACTAM 3.375 G IVPB 30 MIN
3.3750 g | Freq: Once | INTRAVENOUS | Status: AC
  Administered 2019-01-01: 3.375 g via INTRAVENOUS
  Filled 2019-01-01: qty 50

## 2019-01-01 MED ORDER — HEPARIN SODIUM (PORCINE) 5000 UNIT/ML IJ SOLN
5000.0000 [IU] | Freq: Three times a day (TID) | INTRAMUSCULAR | Status: DC
  Administered 2019-01-02 – 2019-01-06 (×12): 5000 [IU] via SUBCUTANEOUS
  Filled 2019-01-01 (×12): qty 1

## 2019-01-01 MED ORDER — LORAZEPAM 0.5 MG PO TABS
0.5000 mg | ORAL_TABLET | Freq: Four times a day (QID) | ORAL | Status: DC | PRN
  Administered 2019-01-04: 0.5 mg via ORAL
  Filled 2019-01-01: qty 1

## 2019-01-01 MED ORDER — ALTEPLASE 2 MG IJ SOLR
2.0000 mg | Freq: Once | INTRAMUSCULAR | Status: AC
  Administered 2019-01-01: 2 mg
  Filled 2019-01-01: qty 2

## 2019-01-01 MED ORDER — ASPIRIN EC 81 MG PO TBEC
81.0000 mg | DELAYED_RELEASE_TABLET | Freq: Every day | ORAL | Status: DC
  Administered 2019-01-03 – 2019-01-06 (×4): 81 mg via ORAL
  Filled 2019-01-01 (×8): qty 1

## 2019-01-01 MED ORDER — METOPROLOL SUCCINATE ER 50 MG PO TB24
50.0000 mg | ORAL_TABLET | Freq: Every day | ORAL | Status: DC
  Administered 2019-01-02 – 2019-01-12 (×8): 50 mg via ORAL
  Filled 2019-01-01 (×2): qty 2
  Filled 2019-01-01 (×2): qty 1
  Filled 2019-01-01: qty 2
  Filled 2019-01-01: qty 1
  Filled 2019-01-01: qty 2
  Filled 2019-01-01 (×2): qty 1
  Filled 2019-01-01 (×2): qty 2
  Filled 2019-01-01 (×2): qty 1
  Filled 2019-01-01: qty 2

## 2019-01-01 MED ORDER — ONDANSETRON HCL 4 MG PO TABS
4.0000 mg | ORAL_TABLET | Freq: Four times a day (QID) | ORAL | Status: DC | PRN
  Administered 2019-01-09: 4 mg via ORAL
  Filled 2019-01-01 (×2): qty 1

## 2019-01-01 MED ORDER — CHLORHEXIDINE GLUCONATE CLOTH 2 % EX PADS
6.0000 | MEDICATED_PAD | Freq: Every day | CUTANEOUS | Status: DC
  Administered 2019-01-02 – 2019-01-04 (×3): 6 via TOPICAL

## 2019-01-01 MED ORDER — LORAZEPAM 2 MG/ML IJ SOLN
0.5000 mg | Freq: Once | INTRAMUSCULAR | Status: AC | PRN
  Administered 2019-01-01: 0.5 mg via INTRAVENOUS

## 2019-01-01 MED ORDER — METOPROLOL TARTRATE 5 MG/5ML IV SOLN
5.0000 mg | Freq: Once | INTRAVENOUS | Status: AC
  Administered 2019-01-01: 08:00:00 5 mg via INTRAVENOUS
  Filled 2019-01-01: qty 5

## 2019-01-01 MED ORDER — PIPERACILLIN-TAZOBACTAM 3.375 G IVPB
3.3750 g | Freq: Three times a day (TID) | INTRAVENOUS | Status: DC
  Administered 2019-01-01 – 2019-01-08 (×19): 3.375 g via INTRAVENOUS
  Filled 2019-01-01 (×18): qty 50

## 2019-01-01 MED ORDER — IOHEXOL 350 MG/ML SOLN
100.0000 mL | Freq: Once | INTRAVENOUS | Status: AC | PRN
  Administered 2019-01-01: 07:00:00 100 mL via INTRAVENOUS

## 2019-01-01 MED ORDER — SODIUM CHLORIDE (PF) 0.9 % IJ SOLN
INTRAMUSCULAR | Status: AC
  Administered 2019-01-01: 07:00:00
  Filled 2019-01-01: qty 50

## 2019-01-01 MED ORDER — ONDANSETRON HCL 4 MG/2ML IJ SOLN
4.0000 mg | Freq: Four times a day (QID) | INTRAMUSCULAR | Status: DC | PRN
  Administered 2019-01-02 – 2019-01-13 (×8): 4 mg via INTRAVENOUS
  Filled 2019-01-01 (×9): qty 2

## 2019-01-01 MED ORDER — PANTOPRAZOLE SODIUM 40 MG PO TBEC
40.0000 mg | DELAYED_RELEASE_TABLET | Freq: Every day | ORAL | Status: DC
  Administered 2019-01-02 – 2019-01-20 (×14): 40 mg via ORAL
  Filled 2019-01-01 (×18): qty 1

## 2019-01-01 MED ORDER — MORPHINE SULFATE (PF) 4 MG/ML IV SOLN
INTRAVENOUS | Status: AC
  Administered 2019-01-01: 4 mg via INTRAVENOUS
  Filled 2019-01-01: qty 1

## 2019-01-01 MED ORDER — LEVOTHYROXINE SODIUM 150 MCG PO TABS
150.0000 ug | ORAL_TABLET | Freq: Every day | ORAL | Status: DC
  Administered 2019-01-02 – 2019-01-20 (×17): 150 ug via ORAL
  Filled 2019-01-01: qty 1
  Filled 2019-01-01: qty 2
  Filled 2019-01-01: qty 1
  Filled 2019-01-01 (×2): qty 3
  Filled 2019-01-01: qty 1
  Filled 2019-01-01 (×2): qty 2
  Filled 2019-01-01: qty 1
  Filled 2019-01-01 (×2): qty 2
  Filled 2019-01-01 (×5): qty 1
  Filled 2019-01-01: qty 3
  Filled 2019-01-01: qty 1
  Filled 2019-01-01: qty 2
  Filled 2019-01-01: qty 1

## 2019-01-01 MED ORDER — ATORVASTATIN CALCIUM 40 MG PO TABS
40.0000 mg | ORAL_TABLET | Freq: Every day | ORAL | Status: DC
  Administered 2019-01-02 – 2019-01-20 (×13): 40 mg via ORAL
  Filled 2019-01-01 (×18): qty 1

## 2019-01-01 MED ORDER — OXYCODONE HCL 5 MG PO TABS
5.0000 mg | ORAL_TABLET | ORAL | Status: DC | PRN
  Administered 2019-01-04 – 2019-01-17 (×17): 5 mg via ORAL
  Filled 2019-01-01 (×20): qty 1

## 2019-01-01 MED ORDER — DOCUSATE SODIUM 100 MG PO CAPS
100.0000 mg | ORAL_CAPSULE | Freq: Two times a day (BID) | ORAL | Status: DC
  Administered 2019-01-02 – 2019-01-20 (×28): 100 mg via ORAL
  Filled 2019-01-01 (×35): qty 1

## 2019-01-01 MED ORDER — METOPROLOL SUCCINATE ER 25 MG PO TB24
25.0000 mg | ORAL_TABLET | Freq: Every evening | ORAL | Status: DC
  Administered 2019-01-02 – 2019-01-13 (×9): 25 mg via ORAL
  Filled 2019-01-01 (×13): qty 1

## 2019-01-01 MED ORDER — MORPHINE SULFATE (PF) 2 MG/ML IV SOLN
2.0000 mg | INTRAVENOUS | Status: DC | PRN
  Administered 2019-01-02 – 2019-01-03 (×6): 2 mg via INTRAVENOUS
  Administered 2019-01-03 (×2): 4 mg via INTRAVENOUS
  Administered 2019-01-03: 2 mg via INTRAVENOUS
  Administered 2019-01-04 (×8): 4 mg via INTRAVENOUS
  Administered 2019-01-04: 2 mg via INTRAVENOUS
  Administered 2019-01-05 (×2): 4 mg via INTRAVENOUS
  Filled 2019-01-01 (×6): qty 2
  Filled 2019-01-01 (×2): qty 1
  Filled 2019-01-01 (×2): qty 2
  Filled 2019-01-01 (×2): qty 1
  Filled 2019-01-01 (×2): qty 2
  Filled 2019-01-01: qty 1
  Filled 2019-01-01: qty 2
  Filled 2019-01-01: qty 1
  Filled 2019-01-01 (×2): qty 2
  Filled 2019-01-01: qty 1
  Filled 2019-01-01: qty 2

## 2019-01-01 MED ORDER — SENNOSIDES-DOCUSATE SODIUM 8.6-50 MG PO TABS
2.0000 | ORAL_TABLET | Freq: Every day | ORAL | Status: DC
  Administered 2019-01-02 – 2019-01-05 (×4): 2 via ORAL
  Filled 2019-01-01 (×7): qty 2

## 2019-01-01 MED ORDER — MORPHINE SULFATE (PF) 4 MG/ML IV SOLN
4.0000 mg | Freq: Once | INTRAVENOUS | Status: AC
  Administered 2019-01-01: 15:00:00 4 mg via INTRAVENOUS

## 2019-01-01 MED ORDER — OXYCODONE HCL 5 MG PO TABS: 5.0000 mg | ORAL_TABLET | ORAL | Status: DC | PRN

## 2019-01-01 MED ORDER — LORAZEPAM 2 MG/ML IJ SOLN
INTRAMUSCULAR | Status: AC
  Filled 2019-01-01: qty 1

## 2019-01-01 MED ORDER — LACTATED RINGERS IV SOLN
INTRAVENOUS | Status: DC
  Administered 2019-01-01 – 2019-01-04 (×5): via INTRAVENOUS

## 2019-01-01 MED ORDER — METOPROLOL SUCCINATE ER 25 MG PO TB24: 25.0000 mg | ORAL_TABLET | Freq: Two times a day (BID) | ORAL | Status: DC

## 2019-01-01 MED ORDER — LEVALBUTEROL HCL 0.63 MG/3ML IN NEBU: 0.6300 mg | INHALATION_SOLUTION | Freq: Three times a day (TID) | RESPIRATORY_TRACT | Status: DC | PRN

## 2019-01-01 MED ORDER — SODIUM CHLORIDE 0.9 % IV SOLN
INTRAVENOUS | Status: DC
  Administered 2019-01-01: 11:00:00 via INTRAVENOUS

## 2019-01-01 MED ORDER — SODIUM CHLORIDE 0.9 % IV BOLUS
1000.0000 mL | Freq: Once | INTRAVENOUS | Status: AC
  Administered 2019-01-01: 1000 mL via INTRAVENOUS

## 2019-01-01 MED ORDER — DULOXETINE HCL 30 MG PO CPEP
60.0000 mg | ORAL_CAPSULE | Freq: Every day | ORAL | Status: DC
  Administered 2019-01-02 – 2019-01-20 (×15): 60 mg via ORAL
  Filled 2019-01-01 (×2): qty 1
  Filled 2019-01-01 (×7): qty 2
  Filled 2019-01-01: qty 1
  Filled 2019-01-01 (×3): qty 2
  Filled 2019-01-01: qty 1
  Filled 2019-01-01: qty 2
  Filled 2019-01-01 (×3): qty 1
  Filled 2019-01-01: qty 2

## 2019-01-01 MED ORDER — POLYETHYLENE GLYCOL 3350 17 G PO PACK
17.0000 g | PACK | Freq: Every day | ORAL | Status: DC
  Administered 2019-01-03 – 2019-01-05 (×2): 17 g via ORAL
  Filled 2019-01-01 (×5): qty 1

## 2019-01-01 MED ORDER — ENSURE ENLIVE PO LIQD
237.0000 mL | Freq: Two times a day (BID) | ORAL | Status: DC
  Administered 2019-01-05 – 2019-01-06 (×3): 237 mL via ORAL

## 2019-01-01 MED ORDER — INSULIN ASPART 100 UNIT/ML ~~LOC~~ SOLN
0.0000 [IU] | SUBCUTANEOUS | Status: DC
  Administered 2019-01-01: 1 [IU] via SUBCUTANEOUS
  Administered 2019-01-01: 2 [IU] via SUBCUTANEOUS
  Administered 2019-01-02 – 2019-01-06 (×9): 1 [IU] via SUBCUTANEOUS
  Filled 2019-01-01: qty 0.09

## 2019-01-01 MED ORDER — TECHNETIUM TC 99M MEBROFENIN IV KIT
5.5000 | PACK | Freq: Once | INTRAVENOUS | Status: AC
  Administered 2019-01-01: 5.5 via INTRAVENOUS

## 2019-01-01 MED ORDER — SODIUM CHLORIDE 0.9% FLUSH
3.0000 mL | Freq: Once | INTRAVENOUS | Status: AC
  Administered 2019-01-01: 06:00:00 3 mL via INTRAVENOUS

## 2019-01-01 MED ORDER — INSULIN GLARGINE 100 UNIT/ML ~~LOC~~ SOLN
50.0000 [IU] | Freq: Every day | SUBCUTANEOUS | Status: DC
  Administered 2019-01-02 – 2019-01-03 (×2): 50 [IU] via SUBCUTANEOUS
  Filled 2019-01-01 (×3): qty 0.5

## 2019-01-01 NOTE — ED Notes (Signed)
Attempted report to the floor. There was a concern about the patient going to a non tele floor with the present HR. Will contact the MD.

## 2019-01-01 NOTE — Progress Notes (Signed)
Receiving report from ED ,was concerned about patient's heart rate and blood pressure since patient is coming to non tele floor . ED nurse will contact MD

## 2019-01-01 NOTE — Progress Notes (Signed)
PHARMACY NOTE -  [facility] has been assisting with dosing of Zosyn for IAI.  Dosage remains stable at 3.375 g IV q8 hr by extended infusion and need for further dosage adjustment appears unlikely at present given renal function appears stable at baseline  Pharmacy will sign off, following peripherally for culture results or dose adjustments. Please reconsult if a change in clinical status warrants re-evaluation of dosage.  Reuel Boom, PharmD, BCPS [phone] 01/01/2019, 2:51 PM

## 2019-01-01 NOTE — ED Notes (Signed)
Pt refusing to put gown on.  Will attempt to access port around night gown.

## 2019-01-01 NOTE — H&P (Signed)
[facility] History and Physical   Patient: Marilyn [name] TTS:[id number]   PCP: [name], FNP DOB: 08-23-1965   DOA: 01/01/2019   DOS: 01/01/2019   DOS: the patient was seen and examined on 01/01/2019  Patient coming from: The patient is coming from Home  Chief Complaint: Abdominal pain  HPI: Marilyn [name] is a 53 y.o. female with Past medical history of metastatic adenocarcinoma, nonischemic cardiomyopathy, HTN, hypothyroidism, malignant pleural effusion S/P thoracentesis x3, hypothyroidism. Patient presents with complaints of abdominal pain.  She reports that this pain has been ongoing for 3 months but in last 3 days this is worsened. She also reports nausea and constipation.  Passing gas.  No vomiting.  She denies any fever or chills.  She denies any active bleeding anywhere. She denies any trauma or injury. Patient is a chemotherapy patient.  Last infusion was on 12/27/2018 Avastin and Alimta along with carboplatin. Patient continues to have shortness of breath as well as chronic cough.  No blood in the sputum.  ED Course: Presents with abdominal pain.  Ultrasound abdomen shows evidence of cholecystitis.  CT scan also shows evidence of gallbladder wall thickening but no other acute abnormality.  Patient was referred for admission due to recurrent right pleural effusion for thoracentesis as well as pain control of abdomen.  At her baseline ambulates without assistance Is independent for most of her ADL;  Does manages her medication on her own.  Review of Systems: as mentioned in the history of present illness.  All other systems reviewed and are negative.  Past Medical History:  Diagnosis Date   Arthritis    Asthma    Diabetes mellitus    Type II   Hemorrhoids    Hypercholesteremia    Hypertension    Hypothyroidism    Metastatic lung cancer (metastasis from lung to other site) (HCC)    Lung, Mets to skin- right flank. 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 mild - EF 45-50% by echo 10/2016 but EF 52% by low risk nuc 11/2016. b. Echo 07/2017 - EF normal, mild AI, unremarkable echo otherwise.   Obese    Pneumonia 2017   Sinus tachycardia    a. dates back to at least 2013, etiology unclear.   Past Surgical History:  Procedure Laterality Date   CESAREAN SECTION     COLONOSCOPY     I/D  arm Right 2013   IR FLUORO GUIDE PORT INSERTION RIGHT  02/18/2017   IR GENERIC HISTORICAL  01/08/2016   IR US GUIDE VASC ACCESS RIGHT 01/08/2016 [name], DO [facility]   IR GENERIC HISTORICAL  01/08/2016   IR FLUORO GUIDE CV LINE RIGHT 01/08/2016 [name], DO [facility]   IR THORACENTESIS ASP PLEURAL SPACE W/IMG GUIDE  05/25/2018   IR US GUIDE VASC ACCESS RIGHT  02/18/2017   MULTIPLE EXTRACTIONS WITH ALVEOLOPLASTY N/A 03/25/2017   Procedure: MULTIPLE EXTRACTION WITH ALVEOLOPLASTY (teeth #s four, six, seven, eight, nine, ten, eleven, one, twenty-three, twenty-four, twenty-five, twenty-six, twenty-nine, thirty);  Surgeon: [name], [name];  Location: [facility];  Service: Oral Surgery;  Laterality: N/A;   TUBAL LIGATION     US guided core needle biopsy     of right lower neck/supraclavicular lymph nodes   Social History:  reports that she has never smoked. She has never used  smokeless tobacco. She reports that she does not drink alcohol or use drugs.  No Known Allergies  Family history reviewed and not pertinent Family History  Problem Relation Age of Onset   Heart failure Mother    Hypertension Mother    Cancer Neg Hx    Rheumatologic disease Neg Hx      Prior to Admission medications   Medication Sig Start Date End Date Taking? Authorizing Provider  ASPIRIN LOW DOSE 81 MG EC tablet TAKE 1 TABLET BY MOUTH EVERY DAY Patient taking differently: Take 81 mg by mouth daily. Enteric  Coated.  ** DO NOT CRUSH ** 03/19/18  Yes [name], MD  atorvastatin (LIPITOR) 40 MG tablet Take 1 tablet (40 mg total) by mouth daily at 6 PM. 04/30/18  Yes Cristal Deer, MD  dexamethasone (DECADRON) 4 MG tablet Take 1 tab two times a day the day before Alimta chemo. Take 2 tabs two times a day starting the day after chemo for 3 days. Patient taking differently: Take 4-8 mg by mouth See admin instructions. Take 4 mg twice daily the day before Alimta chemo, then take 8 mg twice daily starting the day after chemo for 3 days. 09/20/18  Yes [name], MD  diclofenac sodium (VOLTAREN) 1 % GEL Apply 2 g topically 2 (two) times daily as needed (for chestwall pain from costochondiritis). 12/11/18  Yes [name], MD  DULoxetine (CYMBALTA) 60 MG capsule TAKE 1 CAPSULE BY MOUTH EVERY DAY Patient taking differently: Take 60 mg by mouth daily.  12/13/17  Yes [name], MD  feeding supplement, ENSURE ENLIVE, (ENSURE ENLIVE) LIQD Take 237 mLs by mouth 2 (two) times daily between meals. 12/12/18  Yes Mariel Aloe, MD  folic acid (FOLVITE) 1 MG tablet Take 1 tablet (1 mg total) by mouth daily. Start 5-7 days before Alimta chemotherapy. Continue until 21 days after Alimta completed. 09/20/18  Yes [name], MD  hydrOXYzine (ATARAX/VISTARIL) 10 MG tablet Take 1 tablet (10 mg total) by mouth 3 (three) times daily as needed for itching. 04/26/18  Yes [name], MD  insulin aspart (NOVOLOG) 100 UNIT/ML FlexPen As per sliding scale instructions Patient taking differently: Inject 0-10 Units into the skin 3 (three) times daily. As per sliding scale instructions 01/16/16  Yes [name], MD  Insulin Degludec (TRESIBA FLEXTOUCH) 200 UNIT/ML SOPN Inject 100 Units into the skin daily before breakfast.    Yes [provider]  levalbuterol (XOPENEX HFA) 45 MCG/ACT inhaler Inhale 1-2 puffs into the lungs every 8 (eight) hours as needed for wheezing.  04/03/18  Yes Causey, Charlestine Massed, NP  levothyroxine (SYNTHROID) 150 MCG tablet TAKE 1 TABLET BY MOUTH EVERY DAY BEFORE BREAKFAST Patient taking differently: Take 150 mcg by mouth daily before breakfast.  10/26/18  Yes [name], MD  lidocaine-prilocaine (EMLA) cream Apply 1 application topically as needed. Patient taking differently: Apply 1 application topically as needed (port access).  08/23/18  Yes [name], MD  LORazepam (ATIVAN) 0.5 MG tablet Take 1 tablet (0.5 mg total) by mouth every 6 (six) hours as needed (Nausea or vomiting). 10/25/18  Yes [name], MD  magnesium oxide (MAG-OX) 400 (241.3 Mg) MG tablet Take 1 tablet (400 mg total) by mouth 2 (two) times daily. 10/23/18  Yes [name], MD  metoprolol succinate (TOPROL-XL) 50 MG 24 hr tablet Take 1 tablet (3m) by mouth in the morning and 1/2 tablet (255m in the evening.  Take with or immediately following a meal. Patient taking differently: Take 25-50 mg by mouth 2 (two) times daily. Take 19m by mouth in the morning and 265min the evening. Take with or immediately following a meal. 08/10/18  Yes Meng, [place]  ondansetron (ZOFRAN) 8 MG tablet Take 1 tablet (8 mg total) by mouth 2 (two) times daily as needed for refractory nausea / vomiting. Start on day 3 after chemo. 09/20/18  Yes KaBrunetta GeneraMD  oxyCODONE (OXY IR/ROXICODONE) 5 MG immediate release tablet Take 1 tablet (5 mg total) by mouth every 4 (four) hours as needed for severe pain. 11/17/18  Yes ChDonne HazelMD  pantoprazole (PROTONIX) 40 MG tablet Take 1 tablet (40 mg total) by mouth daily. 12/11/18  Yes KaBrunetta GeneraMD  senna-docusate (SENNA S) 8.6-50 MG tablet Take 2 tablets by mouth at bedtime. 12/11/18  Yes KaBrunetta GeneraMD  Vitamins A & D (VITAMIN A & D) OINT Apply 1 application topically 2 (two) times daily. Patient taking differently: Apply 1 application topically 2 (two) times daily as needed (itching).   03/06/18  Yes KaBrunetta GeneraMD  ACCU-CHEK AVIVA PLUS test strip 1 each by Other route 3 (three) times daily.  06/15/18   [provider]  glucose monitoring kit (FREESTYLE) monitoring kit 1 each by Does not apply route as needed for other. 11/05/18   KaBrunetta GeneraMD    Physical Exam: Vitals:   01/01/19 1500 01/01/19 1600 01/01/19 1700 01/01/19 1800  BP: (!) 161/90  (!) 144/83 (!) 149/93  Pulse: (!) 132 (!) 131  (!) 122  Resp: (!) '26 20 16 ' (!) 25  Temp:      TempSrc:      SpO2: 91% 91%  (!) 89%  Weight:      Height:        General: alert and oriented to time, place, and person. Appear in marked distress, affect appropriate Eyes: PERRL, Conjunctiva normal ENT: Oral Mucosa Clear, moist  Neck: no JVD, no Abnormal Mass Or lumps Cardiovascular: S1 and S2 Present, no Murmur, peripheral pulses symmetrical Respiratory: good respiratory effort, Bilateral Air entry equal and Decreased, no signs of accessory muscle use, Clear to Auscultation, no Crackles, no wheezes Abdomen: Bowel Sound present, Soft and diffuse tenderness, no hernia Skin: no rashes  Extremities: trace Pedal edema, no calf tenderness Neurologic: without any new focal findings Gait not checked due to patient safety concerns  Data Reviewed: I have personally reviewed and interpreted labs, imaging as discussed below.  CBC: Recent Labs  Lab 12/27/18 0931 01/01/19 0541  WBC 5.8 4.7  NEUTROABS 4.3  --   HGB 8.9* 7.3*  HCT 27.1* 22.5*  MCV 101.5* 106.1*  PLT 217 21893 Basic Metabolic Panel: Recent Labs  Lab 12/27/18 0931 01/01/19 0541  NA 140 133*  K 4.5 3.6  CL 103 101  CO2 28 20*  GLUCOSE 202* 326*  BUN 11 7  CREATININE 1.00 0.88  CALCIUM 8.6* 7.8*   GFR: Estimated Creatinine Clearance: 104.6 mL/min (by C-G formula based on SCr of 0.88 mg/dL). Liver Function Tests: Recent Labs  Lab 12/27/18 0931 01/01/19 0541  AST 17 27  ALT 10 17  ALKPHOS 100 84  BILITOT 0.3 0.5  PROT 7.6  7.3  ALBUMIN 3.0* 2.8*   Recent Labs  Lab 01/01/19 0541  LIPASE 14   No results for input(s): AMMONIA in the last 168 hours. Coagulation Profile: No results for input(s):  INR, PROTIME in the last 168 hours. Cardiac Enzymes: No results for input(s): CKTOTAL, CKMB, CKMBINDEX, TROPONINI in the last 168 hours. BNP (last 3 results) No results for input(s): PROBNP in the last 8760 hours. HbA1C: No results for input(s): HGBA1C in the last 72 hours. CBG: Recent Labs  Lab 01/01/19 1656  GLUCAP 151*   Lipid Profile: No results for input(s): CHOL, HDL, LDLCALC, TRIG, CHOLHDL, LDLDIRECT in the last 72 hours. Thyroid Function Tests: No results for input(s): TSH, T4TOTAL, FREET4, T3FREE, THYROIDAB in the last 72 hours. Anemia Panel: No results for input(s): VITAMINB12, FOLATE, FERRITIN, TIBC, IRON, RETICCTPCT in the last 72 hours. Urine analysis:    Component Value Date/Time   COLORURINE YELLOW 01/01/2019 0541   APPEARANCEUR CLEAR 01/01/2019 0541   LABSPEC 1.028 01/01/2019 0541   PHURINE 6.0 01/01/2019 0541   GLUCOSEU 150 (A) 01/01/2019 0541   HGBUR NEGATIVE 01/01/2019 0541   BILIRUBINUR NEGATIVE 01/01/2019 0541   KETONESUR NEGATIVE 01/01/2019 0541   PROTEINUR 30 (A) 01/01/2019 0541   UROBILINOGEN 0.2 02/01/2013 2022   NITRITE NEGATIVE 01/01/2019 0541   LEUKOCYTESUR NEGATIVE 01/01/2019 0541    Radiological Exams on Admission: Dg Chest 2 View  Result Date: 01/01/2019 CLINICAL DATA:  Shortness of breath EXAM: CHEST - 2 VIEW COMPARISON:  Twenty days ago FINDINGS: Right perihilar opacity (likely radiation fibrosis by CT) with volume loss and small to moderate pleural effusion. Pleural fluid has increased from prior. The left lung remains clear. Normal heart size. Port on the right with tip at the SVC. IMPRESSION: Small to moderate right pleural effusion with increase from 3 weeks ago. Electronically Signed   By: Monte Fantasia M.D.   On: 01/01/2019 05:29   Ct Angio Chest Pe W And/or  Wo Contrast  Result Date: 01/01/2019 CLINICAL DATA:  Shortness of breath. Stage IV lung carcinoma. Abdominal pain with nausea EXAM: CT ANGIOGRAPHY CHEST CT ABDOMEN AND PELVIS WITH CONTRAST TECHNIQUE: Multidetector CT imaging of the chest was performed using the standard protocol during bolus administration of intravenous contrast. Multiplanar CT image reconstructions and MIPs were obtained to evaluate the vascular anatomy. Multidetector CT imaging of the abdomen and pelvis was performed using the standard protocol during bolus administration of intravenous contrast. CONTRAST:  162m OMNIPAQUE IOHEXOL 350 MG/ML SOLN COMPARISON:  CT angiogram chest December 10, 2018; CT abdomen and pelvis November 29, 2018; chest radiograph January 01, 2019 FINDINGS: CTA CHEST FINDINGS Cardiovascular: There is no demonstrable pulmonary embolus. There is no thoracic aortic aneurysm or dissection. The visualized great vessels appear unremarkable. There is rather minimal pericardial fluid. There is no pericardial thickening. Port-A-Cath tip is in the superior vena cava. Mediastinum/Nodes: There remains extensive axillary adenopathy on the right. The largest individual lymph node in the right axilla measures 3.4 x 2.7 cm. There is adenopathy in the right paratracheal region with the largest lymph node in this region measuring 2.3 x 2.1 cm. There is soft tissue prominence in the hilum which is inseparable from lung collapse. The appearance is similar to recent study. No esophageal lesions are evident. Lungs/Pleura: Sizable pleural effusion on the right remains with consolidation/compressive atelectasis throughout the right middle and lower lobes. There is airspace consolidation in the medial aspect of the right upper lobe, stable. On the left, there are areas of relatively mild lower lobe atelectatic change. No consolidation seen on the left. Musculoskeletal: No blastic or lytic bone lesions are evident. No chest wall lesions  appreciable. Review of the MIP images confirms the above findings. CT  ABDOMEN and PELVIS FINDINGS Hepatobiliary: No focal liver lesions are appreciable. The gallbladder is mildly contracted with gallbladder wall mildly thickened. Gallstones were better seen on recent CT. The proximal common bile duct measures 12 mm which is dilated. The common bile duct tapers distally without mass or calculus evident by radiography. Pancreas: There is no appreciable pancreatic mass or inflammatory focus. Spleen: No splenic lesions are evident. Adrenals/Urinary Tract: Adrenals bilaterally appear normal. Kidneys bilaterally show no evident mass or hydronephrosis on either side. Urinary bladder is midline with wall thickness within normal limits. Stomach/Bowel: There is no appreciable bowel wall or mesenteric thickening. There is no evident bowel obstruction. No free air or portal venous air. The terminal ileum appears unremarkable. Vascular/Lymphatic: There is no abdominal aortic aneurysm. There is aortic atherosclerosis. There are mildly prominent lymph nodes in the pelvis, stable. The largest lymph node is in the right internal iliac node chain measuring 2.4 x 1.7 cm. There are multiple retroperitoneal lymph nodes, similar to prior study with the largest of these retroperitoneal lymph nodes measuring 0.9 cm in short axis diameter. No new lymph node prominence evident. Reproductive: Uterus is retroverted. Leiomyomatous changes in the uterus are grossly stable compared to the previous study. No extrauterine pelvic mass is evident. Other: Appendix not seen. No periappendiceal region inflammation. No abscess or ascites evident in the abdomen or pelvis. Musculoskeletal: No blastic or lytic bone lesions. No intramuscular or abdominal wall lesions. Review of the MIP images confirms the above findings. IMPRESSION: CT angiogram chest: 1. No demonstrable pulmonary embolus. No thoracic aortic aneurysm or dissection. 2. Sizable right pleural  effusion with consolidation and compressive atelectasis throughout much of the right lung. Essentially complete collapse of the right middle and lower lobes remains. Consolidation medial aspect of the right upper lobe is present. 3. Right paratracheal and right axillary adenopathy, similar to recent study. 4.  Left lower lobe atelectatic change. CT abdomen and pelvis: 1. Areas of lymph node prominence, primarily in the right internal iliac chain, stable and likely of neoplastic etiology given the known lung carcinoma. No new adenopathy. 2. Gallbladder wall is thickened. Known gallstones are better seen on recent CT. There is mild biliary duct dilatation. Correlation with ultrasound to better assess the gallbladder advised given the degree of wall thickening in this area. 3.  Leiomyomatous uterus. 4. No bowel obstruction. No abscess in the abdomen or pelvis. No periappendiceal region inflammation. 5. No renal or ureteral calculi. No hydronephrosis. Urinary bladder wall thickness within normal limits. Electronically Signed   By: Lowella Grip III M.D.   On: 01/01/2019 08:12   Nm Hepatobiliary Liver Func  Result Date: 01/01/2019 CLINICAL DATA:  Cholelithiasis, RIGHT upper quadrant pain EXAM: NUCLEAR MEDICINE HEPATOBILIARY IMAGING TECHNIQUE: Sequential images of the abdomen were obtained out to 60 minutes following intravenous administration of radiopharmaceutical. RADIOPHARMACEUTICALS:  5.5 mCi Tc-47m Choletec IV COMPARISON:  None Correlation: Ultrasound abdomen 01/01/2019, CT abdomen and pelvis 01/01/2019 FINDINGS: Prompt tracer extraction from bloodstream indicating normal hepatocellular function. Prompt excretion of tracer into biliary tree. Small bowel visualized at 11 minutes. At 1 hour gallbladder had not visualized. Patient then received 4 mg of morphine sulfate IV and imaging was continued for 30 minutes. Faint visualization of the gallbladder following morphine administration. Gallbladder was  contracted on prior CT and ultrasound exams exam with mild wall thickening. Delayed visualization of the gallbladder may be seen in patients with chronic cholecystitis. IMPRESSION: Delayed visualization of the gallbladder, seen only following morphine augmentation, raising question of chronic  cholecystitis. Gallbladder was contracted with a thickened wall on prior CT and ultrasound exams. Patent CBD. Electronically Signed   By: Lavonia Dana M.D.   On: 01/01/2019 16:18   Ct Abdomen Pelvis W Contrast  Result Date: 01/01/2019 CLINICAL DATA:  Shortness of breath. Stage IV lung carcinoma. Abdominal pain with nausea EXAM: CT ANGIOGRAPHY CHEST CT ABDOMEN AND PELVIS WITH CONTRAST TECHNIQUE: Multidetector CT imaging of the chest was performed using the standard protocol during bolus administration of intravenous contrast. Multiplanar CT image reconstructions and MIPs were obtained to evaluate the vascular anatomy. Multidetector CT imaging of the abdomen and pelvis was performed using the standard protocol during bolus administration of intravenous contrast. CONTRAST:  167m OMNIPAQUE IOHEXOL 350 MG/ML SOLN COMPARISON:  CT angiogram chest December 10, 2018; CT abdomen and pelvis November 29, 2018; chest radiograph January 01, 2019 FINDINGS: CTA CHEST FINDINGS Cardiovascular: There is no demonstrable pulmonary embolus. There is no thoracic aortic aneurysm or dissection. The visualized great vessels appear unremarkable. There is rather minimal pericardial fluid. There is no pericardial thickening. Port-A-Cath tip is in the superior vena cava. Mediastinum/Nodes: There remains extensive axillary adenopathy on the right. The largest individual lymph node in the right axilla measures 3.4 x 2.7 cm. There is adenopathy in the right paratracheal region with the largest lymph node in this region measuring 2.3 x 2.1 cm. There is soft tissue prominence in the hilum which is inseparable from lung collapse. The appearance is similar to  recent study. No esophageal lesions are evident. Lungs/Pleura: Sizable pleural effusion on the right remains with consolidation/compressive atelectasis throughout the right middle and lower lobes. There is airspace consolidation in the medial aspect of the right upper lobe, stable. On the left, there are areas of relatively mild lower lobe atelectatic change. No consolidation seen on the left. Musculoskeletal: No blastic or lytic bone lesions are evident. No chest wall lesions appreciable. Review of the MIP images confirms the above findings. CT ABDOMEN and PELVIS FINDINGS Hepatobiliary: No focal liver lesions are appreciable. The gallbladder is mildly contracted with gallbladder wall mildly thickened. Gallstones were better seen on recent CT. The proximal common bile duct measures 12 mm which is dilated. The common bile duct tapers distally without mass or calculus evident by radiography. Pancreas: There is no appreciable pancreatic mass or inflammatory focus. Spleen: No splenic lesions are evident. Adrenals/Urinary Tract: Adrenals bilaterally appear normal. Kidneys bilaterally show no evident mass or hydronephrosis on either side. Urinary bladder is midline with wall thickness within normal limits. Stomach/Bowel: There is no appreciable bowel wall or mesenteric thickening. There is no evident bowel obstruction. No free air or portal venous air. The terminal ileum appears unremarkable. Vascular/Lymphatic: There is no abdominal aortic aneurysm. There is aortic atherosclerosis. There are mildly prominent lymph nodes in the pelvis, stable. The largest lymph node is in the right internal iliac node chain measuring 2.4 x 1.7 cm. There are multiple retroperitoneal lymph nodes, similar to prior study with the largest of these retroperitoneal lymph nodes measuring 0.9 cm in short axis diameter. No new lymph node prominence evident. Reproductive: Uterus is retroverted. Leiomyomatous changes in the uterus are grossly stable  compared to the previous study. No extrauterine pelvic mass is evident. Other: Appendix not seen. No periappendiceal region inflammation. No abscess or ascites evident in the abdomen or pelvis. Musculoskeletal: No blastic or lytic bone lesions. No intramuscular or abdominal wall lesions. Review of the MIP images confirms the above findings. IMPRESSION: CT angiogram chest: 1. No demonstrable pulmonary embolus.  No thoracic aortic aneurysm or dissection. 2. Sizable right pleural effusion with consolidation and compressive atelectasis throughout much of the right lung. Essentially complete collapse of the right middle and lower lobes remains. Consolidation medial aspect of the right upper lobe is present. 3. Right paratracheal and right axillary adenopathy, similar to recent study. 4.  Left lower lobe atelectatic change. CT abdomen and pelvis: 1. Areas of lymph node prominence, primarily in the right internal iliac chain, stable and likely of neoplastic etiology given the known lung carcinoma. No new adenopathy. 2. Gallbladder wall is thickened. Known gallstones are better seen on recent CT. There is mild biliary duct dilatation. Correlation with ultrasound to better assess the gallbladder advised given the degree of wall thickening in this area. 3.  Leiomyomatous uterus. 4. No bowel obstruction. No abscess in the abdomen or pelvis. No periappendiceal region inflammation. 5. No renal or ureteral calculi. No hydronephrosis. Urinary bladder wall thickness within normal limits. Electronically Signed   By: Lowella Grip III M.D.   On: 01/01/2019 08:12   US Abdomen Limited  Result Date: 01/01/2019 CLINICAL DATA:  Abdominal pain EXAM: ULTRASOUND ABDOMEN LIMITED RIGHT UPPER QUADRANT COMPARISON:  CT from the same day. FINDINGS: Gallbladder: There is gallbladder wall thickening. There is no pericholecystic free fluid. The sonographic Percell Miller sign is reported as positive. Gallstones are noted measuring up to approximately  1.1 cm. Evaluation of the gallbladder was limited by patient body habitus. The gallbladder appears somewhat contracted. Common bile duct: Diameter: 1.2 cm Liver: Diffuse increased echogenicity with slightly heterogeneous liver. Appearance typically secondary to fatty infiltration. Fibrosis secondary consideration. No secondary findings of cirrhosis noted. No focal hepatic lesion or intrahepatic biliary duct dilatation. Portal vein is patent on color Doppler imaging with normal direction of blood flow towards the liver. Other: A right-sided pleural effusion is noted. IMPRESSION: 1. Evaluation limited by patient body habitus and poor sonographic windows. 2. There is cholelithiasis with gallbladder wall thickening and a questionable positive sonographic Murphy sign. Findings are concerning for acute calculus cholecystitis in the appropriate clinical setting. HIDA scan may be useful for confirmation given the gallbladder is not significantly distended. 3. Dilated common bile duct. This raises concern for an obstructing process such as choledocholithiasis. Correlation with laboratory studies is recommended. If there is clinical concern for an obstructing process, follow-up with MRCP/ERCP is recommended. 4. Right-sided pleural effusion. 5. Probable underlying hepatic steatosis. Electronically Signed   By: Constance Holster M.D.   On: 01/01/2019 09:33   EKG: Independently reviewed. nonspecific ST and T waves changes, sinus tachycardia. Echocardiogram: November 17, 2018, EF 60 to 65%, restrictive filling pattern, moderate aortic valve regurgitation.  I reviewed all nursing notes, pharmacy notes, vitals, pertinent old records.  Assessment/Plan 1.  Acute on chronic abdominal pain Chronic cholecystitis Presents with complaints of abdominal pain. CT abdomen and pelvis is positive for thick gallbladder, gallstones, CBD dilation and leiomyomatous uterus but no evidence of acute infection or inflammation. Ultrasound  abdomen also showed cholelithiasis.  Dilated CBD and gallbladder wall thickening concerning for acute calculus cholecystitis. Patient had normal LFTs normal WBC and no fever. HIDA scan performed shows evidence of chronic cholecystitis without any evidence of acute component. Discussed with Dr. Samule Ohm surgery on-call who recommends that the patient will require her gallbladder removed based on the results of the HIDA scan but given her history of active chemotherapy they would like to hold off on that and recommend outpatient follow-up in the clinic. Currently empirically patient will be treated with IV  Zosyn given her chemotherapy and immunosuppressed status and will monitor closely on blood culture.  2.  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 She has 3 thoracentesis so far. CT PE protocol in the ER is concerning for reaccumulation of the fluid Negative for PE. Discussed with patient regarding option of management of recurrent pleural effusion. Patient does not want to undergo Pleurx catheter placement as she is worried about infection. Another possible option would be pleurodesis and patient needs time to think about it as well. Given the large size of the pleural effusion I have offered thoracentesis first to immediate need for symptomatic relief and will discuss other options in detail later.  3.  Metastatic non-small cell lung cancer. Follows up with Dr. Irene Limbo. Currently on chemotherapy with carboplatin, Alimta and Avastin.  Last cycle on 12/27/2018 Monitor for now. EDP discussed with Dr. Irene Limbo who is aware of patient's admission.  4.  Depression. Patient is on Cymbalta which I will continue.  5.  Abdominal pain. Constipation. CT evidence of possible constipation. We will add stool softener and monitor.  6.  Chronic diastolic dysfunction. Prior history of nonischemic  cardiomyopathy showed normal EF Monitor for volume overload while the patient is receiving IV hydration.  7. Type 2 Diabetes Mellitus, poorly controled without complication last hemoglobin A1c was 10. Blood glucose moderately controlled  -hold her home hypoglycemic agents. -On insulin sliding scale moderate with Basal insulin at half dose.  Nutrition: NPO except ice chips sips of water due to pain DVT Prophylaxis: Subcutaneous Lovenox  Advance goals of care discussion: Full code   Consults: I personally Discussed with General surgery Dr. Marcello Moores   Family Communication: no family was present at bedside, at the time of interview.  Disposition: Admitted as observation, telemetry unit. Likely to be discharged home, in 3 days.  I have discussed plan of care as described above with RN and patient/family.  Author: Berle Mull, MD [facility] 01/01/2019 6:22 PM   To reach On-call, see care teams to locate the attending and reach out to them via [email or web address]. If 7PM-7AM, please contact night-coverage If you still have difficulty reaching the attending provider, please page the [facility] (Director on Call) for [facility] on amion for assistance.

## 2019-01-01 NOTE — ED Triage Notes (Addendum)
Patient here from home with complaints of abd pain x3 months, nausea, constipation, and SOB. Hx of stage 4 lung cancer. Given 4mg  Zofran, 250cc NS Bolus.

## 2019-01-01 NOTE — Progress Notes (Signed)
Attempted to get pt down for MRI at 1800 called ER multiple times with no answer will try and get pt down later today.

## 2019-01-01 NOTE — ED Notes (Addendum)
Antibiotics started prior to blood cultures. Unable to obtain blood cultures at this time. Patient taken to MRI. Receiving RN made aware. Receiving RN will call lab to obtain when the patient gets to the floor from MRI.

## 2019-01-01 NOTE — Progress Notes (Signed)
ED TO INPATIENT HANDOFF REPORT  Name/Age/Gender Marilyn [name] 53 y.o. female  Code Status    Code Status Orders  (From admission, onward)         Start     Ordered   01/01/19 1446  Full code  Continuous     01/01/19 1447        Code Status History    Date Active Date Inactive Code Status Order ID Comments User Context   12/11/2018 0412 12/12/2018 2045 Full Code [id number]  Rise Patience, MD Inpatient   11/16/2018 1746 11/17/2018 2129 Full Code [id number]  Elodia Florence., MD Inpatient   04/29/2018 0102 04/30/2018 1642 Full Code [id number]  Ivor Costa, MD ED   07/27/2017 1757 07/29/2017 1807 Full Code [id number]  Velvet Bathe, MD Inpatient   12/26/2015 0806 12/30/2015 2053 Full Code [id number]  Elgergawy, Silver Huguenin, MD Inpatient   Advance Care Planning Activity    Advance Directive Documentation     Most Recent Value  Type of Advance Directive  Healthcare Power of Attorney  Pre-existing out of facility DNR order (yellow form or pink MOST form)  -  "MOST" Form in Place?  -      Home/SNF/Other Home  Chief Complaint CA Pt; Shortness of breath  Level of Care/Admitting Diagnosis ED Disposition    ED Disposition Condition [place]: [place] [100102]  Level of Care: Telemetry [5]  Admit to tele based on following criteria: Complex arrhythmia (Bradycardia/Tachycardia)  Covid Evaluation: Asymptomatic Screening Protocol (No Symptoms)  Diagnosis: Recurrent right pleural effusion [762831]  Admitting Physician: Lavina Hamman [[id number]]  Attending Physician: Lavina Hamman [[id number]]  PT Class (Do Not Modify): Observation [104]  PT Acc Code (Do Not Modify): Observation [10022]       Medical History Past Medical History:  Diagnosis Date  . Arthritis   . Asthma   . Diabetes mellitus    Type II  . Hemorrhoids   . Hypercholesteremia   . Hypertension   . Hypothyroidism   . Metastatic lung cancer (metastasis from lung to  other site) (HCC)    Lung, Mets to skin- right flank. CT angio 2019 suspicious for axillary, external iliac, right inguinal mets  . 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 NICM (nonischemic cardiomyopathy) ([place])    a. mild - EF 45-50% by echo 10/2016 but EF 52% by low risk nuc 11/2016. b. Echo 07/2017 - EF normal, mild AI, unremarkable echo otherwise.  . Obese   . Pneumonia 2017  . Sinus tachycardia    a. dates back to at least 2013, etiology unclear.    Allergies No Known Allergies  IV Location/Drains/Wounds Patient Lines/Drains/Airways Status   Active Line/Drains/Airways    Name:   Placement date:   Placement time:   Site:   Days:   Implanted Port 02/18/17 Right Chest   02/18/17    1330    Chest   682   Peripheral IV 01/01/19 Right Forearm   01/01/19    0358    Forearm   less than 1   External Urinary Catheter   01/01/19    6948    -   less than 1   Incision (Closed) 03/25/17 Lip Other (Comment)   03/25/17    0916     647   Pressure Injury 11/16/18 Buttocks Right;Medial Stage II -  Partial thickness loss of dermis presenting as a shallow  open ulcer with a red, pink wound bed without slough. right inner buttock   11/16/18    1815     46   Wound / Incision (Open or Dehisced) 11/16/18 Other (Comment) Thigh Right;Medial Abscess to right inner thigh that was I&D recently by MD   11/16/18    1810    Thigh   46   Wound / Incision (Open or Dehisced) 12/11/18 Non-pressure wound Thigh Right;Medial   12/11/18    0435    Thigh   21          Labs/Imaging Results for orders placed or performed during the hospital encounter of 01/01/19 (from the past 48 hour(s))  Lipase, blood     Status: None   Collection Time: 01/01/19  5:41 AM  Result Value Ref Range   Lipase 14 11 - 51 U/L    Comment: Performed at [facility], [place] [address], [place], [place] [address]  Comprehensive metabolic panel     Status: Abnormal   Collection Time: 01/01/19  5:41 AM  Result  Value Ref Range   Sodium 133 (L) 135 - 145 mmol/L   Potassium 3.6 3.5 - 5.1 mmol/L   Chloride 101 98 - 111 mmol/L   CO2 20 (L) 22 - 32 mmol/L   Glucose, Bld 326 (H) 70 - 99 mg/dL   BUN 7 6 - 20 mg/dL   Creatinine, Ser 0.88 0.44 - 1.00 mg/dL   Calcium 7.8 (L) 8.9 - 10.3 mg/dL   Total Protein 7.3 6.5 - 8.1 g/dL   Albumin 2.8 (L) 3.5 - 5.0 g/dL   AST 27 15 - 41 U/L   ALT 17 0 - 44 U/L   Alkaline Phosphatase 84 38 - 126 U/L   Total Bilirubin 0.5 0.3 - 1.2 mg/dL   GFR calc non Af Amer >60 >60 mL/min   GFR calc Af Amer >60 >60 mL/min   Anion gap 12 5 - 15    Comment: Performed at [facility], [place] [address], [place], [place] [address]  CBC     Status: Abnormal   Collection Time: 01/01/19  5:41 AM  Result Value Ref Range   WBC 4.7 4.0 - 10.5 K/uL   RBC 2.12 (L) 3.87 - 5.11 MIL/uL   Hemoglobin 7.3 (L) 12.0 - 15.0 g/dL   HCT 22.5 (L) 36.0 - 46.0 %   MCV 106.1 (H) 80.0 - 100.0 fL   MCH 34.4 (H) 26.0 - 34.0 pg   MCHC 32.4 30.0 - 36.0 g/dL   RDW 20.3 (H) 11.5 - 15.5 %   Platelets 211 150 - 400 K/uL   nRBC 0.0 0.0 - 0.2 %    Comment: Performed at [facility], [place] [address] [place], [place] [address]  Urinalysis, Routine w reflex microscopic     Status: Abnormal   Collection Time: 01/01/19  5:41 AM  Result Value Ref Range   Color, Urine YELLOW YELLOW   APPearance CLEAR CLEAR   Specific Gravity, Urine 1.028 1.005 - 1.030   pH 6.0 5.0 - 8.0   Glucose, UA 150 (A) NEGATIVE mg/dL   Hgb urine dipstick NEGATIVE NEGATIVE   Bilirubin Urine NEGATIVE NEGATIVE   Ketones, ur NEGATIVE NEGATIVE mg/dL   Protein, ur 30 (A) NEGATIVE mg/dL   Nitrite NEGATIVE NEGATIVE   Leukocytes,Ua NEGATIVE NEGATIVE   RBC / HPF 0-5 0 - 5 RBC/hpf   WBC, UA 0-5 0 - 5 WBC/hpf   Bacteria, UA NONE  SEEN NONE SEEN   Squamous Epithelial / LPF 0-5 0 - 5    Comment: Performed at [facility], [place] [address], [place], [place] [address]  Troponin I (High  Sensitivity)     Status: None   Collection Time: 01/01/19  5:41 AM  Result Value Ref Range   Troponin I (High Sensitivity) 11 <18 ng/L    Comment: (NOTE) Elevated high sensitivity troponin I (hsTnI) values and significant  changes across serial measurements may suggest ACS but many other  chronic and acute conditions are known to elevate hsTnI results.  Refer to the "Links" section for chest pain algorithms and additional  guidance. Performed at [facility], [place] [address], [place], [place] [address]   I-Stat beta hCG blood, ED     Status: None   Collection Time: 01/01/19  5:46 AM  Result Value Ref Range   I-stat hCG, quantitative <5.0 <5 mIU/mL   Comment 3            Comment:   GEST. AGE      CONC.  (mIU/mL)   <=1 WEEK        5 - 50     2 WEEKS       50 - 500     3 WEEKS       100 - 10,000     4 WEEKS     1,000 - 30,000        FEMALE AND NON-PREGNANT FEMALE:     LESS THAN 5 mIU/mL   SARS CORONAVIRUS 2 (TAT 6-24 HRS) Nasopharyngeal Nasopharyngeal Swab     Status: None   Collection Time: 01/01/19  6:43 AM   Specimen: Nasopharyngeal Swab  Result Value Ref Range   SARS Coronavirus 2 NEGATIVE NEGATIVE    Comment: (NOTE) SARS-CoV-2 target nucleic acids are NOT DETECTED. The SARS-CoV-2 RNA is generally detectable in upper and lower respiratory specimens during the acute phase of infection. Negative results do not preclude SARS-CoV-2 infection, do not rule out co-infections with other pathogens, and should not be used as the sole basis for treatment or other patient management decisions. Negative results must be combined with clinical observations, patient history, and epidemiological information. The expected result is Negative. Fact Sheet for Patients: SugarRoll.be Fact Sheet for Healthcare Providers: https://www.woods-mathews.com/ This test is not yet approved or cleared by the [place] FDA and  has been  authorized for detection and/or diagnosis of SARS-CoV-2 by FDA under an Emergency Use Authorization (EUA). This EUA will remain  in effect (meaning this test can be used) for the duration of the COVID-19 declaration under Section 56 4(b)(1) of the Act, 21 U.S.C. section 360bbb-3(b)(1), unless the authorization is terminated or revoked sooner. Performed at [facility], [place] [address] [place], [place] [address]   Troponin I (High Sensitivity)     Status: None   Collection Time: 01/01/19  7:35 AM  Result Value Ref Range   Troponin I (High Sensitivity) 11 <18 ng/L    Comment: (NOTE) Elevated high sensitivity troponin I (hsTnI) values and significant  changes across serial measurements may suggest ACS but many other  chronic and acute conditions are known to elevate hsTnI results.  Refer to the "Links" section for chest pain algorithms and additional  guidance. Performed at [facility], [place] [address], [place], [place] [address]   CBG monitoring, ED     Status: Abnormal   Collection Time: 01/01/19  4:56 PM  Result  Value Ref Range   Glucose-Capillary 151 (H) 70 - 99 mg/dL   Dg Chest 2 View  Result Date: 01/01/2019 CLINICAL DATA:  Shortness of breath EXAM: CHEST - 2 VIEW COMPARISON:  Twenty days ago FINDINGS: Right perihilar opacity (likely radiation fibrosis by CT) with volume loss and small to moderate pleural effusion. Pleural fluid has increased from prior. The left lung remains clear. Normal heart size. Port on the right with tip at the SVC. IMPRESSION: Small to moderate right pleural effusion with increase from 3 weeks ago. Electronically Signed   By: Monte Fantasia M.D.   On: 01/01/2019 05:29   Ct Angio Chest Pe W And/or Wo Contrast  Result Date: 01/01/2019 CLINICAL DATA:  Shortness of breath. Stage IV lung carcinoma. Abdominal pain with nausea EXAM: CT ANGIOGRAPHY CHEST CT ABDOMEN AND PELVIS WITH CONTRAST TECHNIQUE: Multidetector CT imaging of the chest  was performed using the standard protocol during bolus administration of intravenous contrast. Multiplanar CT image reconstructions and MIPs were obtained to evaluate the vascular anatomy. Multidetector CT imaging of the abdomen and pelvis was performed using the standard protocol during bolus administration of intravenous contrast. CONTRAST:  15mL OMNIPAQUE IOHEXOL 350 MG/ML SOLN COMPARISON:  CT angiogram chest December 10, 2018; CT abdomen and pelvis November 29, 2018; chest radiograph January 01, 2019 FINDINGS: CTA CHEST FINDINGS Cardiovascular: There is no demonstrable pulmonary embolus. There is no thoracic aortic aneurysm or dissection. The visualized great vessels appear unremarkable. There is rather minimal pericardial fluid. There is no pericardial thickening. Port-A-Cath tip is in the superior vena cava. Mediastinum/Nodes: There remains extensive axillary adenopathy on the right. The largest individual lymph node in the right axilla measures 3.4 x 2.7 cm. There is adenopathy in the right paratracheal region with the largest lymph node in this region measuring 2.3 x 2.1 cm. There is soft tissue prominence in the hilum which is inseparable from lung collapse. The appearance is similar to recent study. No esophageal lesions are evident. Lungs/Pleura: Sizable pleural effusion on the right remains with consolidation/compressive atelectasis throughout the right middle and lower lobes. There is airspace consolidation in the medial aspect of the right upper lobe, stable. On the left, there are areas of relatively mild lower lobe atelectatic change. No consolidation seen on the left. Musculoskeletal: No blastic or lytic bone lesions are evident. No chest wall lesions appreciable. Review of the MIP images confirms the above findings. CT ABDOMEN and PELVIS FINDINGS Hepatobiliary: No focal liver lesions are appreciable. The gallbladder is mildly contracted with gallbladder wall mildly thickened. Gallstones were  better seen on recent CT. The proximal common bile duct measures 12 mm which is dilated. The common bile duct tapers distally without mass or calculus evident by radiography. Pancreas: There is no appreciable pancreatic mass or inflammatory focus. Spleen: No splenic lesions are evident. Adrenals/Urinary Tract: Adrenals bilaterally appear normal. Kidneys bilaterally show no evident mass or hydronephrosis on either side. Urinary bladder is midline with wall thickness within normal limits. Stomach/Bowel: There is no appreciable bowel wall or mesenteric thickening. There is no evident bowel obstruction. No free air or portal venous air. The terminal ileum appears unremarkable. Vascular/Lymphatic: There is no abdominal aortic aneurysm. There is aortic atherosclerosis. There are mildly prominent lymph nodes in the pelvis, stable. The largest lymph node is in the right internal iliac node chain measuring 2.4 x 1.7 cm. There are multiple retroperitoneal lymph nodes, similar to prior study with the largest of these retroperitoneal lymph nodes measuring 0.9 cm in short axis  diameter. No new lymph node prominence evident. Reproductive: Uterus is retroverted. Leiomyomatous changes in the uterus are grossly stable compared to the previous study. No extrauterine pelvic mass is evident. Other: Appendix not seen. No periappendiceal region inflammation. No abscess or ascites evident in the abdomen or pelvis. Musculoskeletal: No blastic or lytic bone lesions. No intramuscular or abdominal wall lesions. Review of the MIP images confirms the above findings. IMPRESSION: CT angiogram chest: 1. No demonstrable pulmonary embolus. No thoracic aortic aneurysm or dissection. 2. Sizable right pleural effusion with consolidation and compressive atelectasis throughout much of the right lung. Essentially complete collapse of the right middle and lower lobes remains. Consolidation medial aspect of the right upper lobe is present. 3. Right  paratracheal and right axillary adenopathy, similar to recent study. 4.  Left lower lobe atelectatic change. CT abdomen and pelvis: 1. Areas of lymph node prominence, primarily in the right internal iliac chain, stable and likely of neoplastic etiology given the known lung carcinoma. No new adenopathy. 2. Gallbladder wall is thickened. Known gallstones are better seen on recent CT. There is mild biliary duct dilatation. Correlation with ultrasound to better assess the gallbladder advised given the degree of wall thickening in this area. 3.  Leiomyomatous uterus. 4. No bowel obstruction. No abscess in the abdomen or pelvis. No periappendiceal region inflammation. 5. No renal or ureteral calculi. No hydronephrosis. Urinary bladder wall thickness within normal limits. Electronically Signed   By: Lowella Grip III M.D.   On: 01/01/2019 08:12   Nm Hepatobiliary Liver Func  Result Date: 01/01/2019 CLINICAL DATA:  Cholelithiasis, RIGHT upper quadrant pain EXAM: NUCLEAR MEDICINE HEPATOBILIARY IMAGING TECHNIQUE: Sequential images of the abdomen were obtained out to 60 minutes following intravenous administration of radiopharmaceutical. RADIOPHARMACEUTICALS:  5.5 mCi Tc-46m  Choletec IV COMPARISON:  None Correlation: Ultrasound abdomen 01/01/2019, CT abdomen and pelvis 01/01/2019 FINDINGS: Prompt tracer extraction from bloodstream indicating normal hepatocellular function. Prompt excretion of tracer into biliary tree. Small bowel visualized at 11 minutes. At 1 hour gallbladder had not visualized. Patient then received 4 mg of morphine sulfate IV and imaging was continued for 30 minutes. Faint visualization of the gallbladder following morphine administration. Gallbladder was contracted on prior CT and ultrasound exams exam with mild wall thickening. Delayed visualization of the gallbladder may be seen in patients with chronic cholecystitis. IMPRESSION: Delayed visualization of the gallbladder, seen only following  morphine augmentation, raising question of chronic cholecystitis. Gallbladder was contracted with a thickened wall on prior CT and ultrasound exams. Patent CBD. Electronically Signed   By: Lavonia Dana M.D.   On: 01/01/2019 16:18   Ct Abdomen Pelvis W Contrast  Result Date: 01/01/2019 CLINICAL DATA:  Shortness of breath. Stage IV lung carcinoma. Abdominal pain with nausea EXAM: CT ANGIOGRAPHY CHEST CT ABDOMEN AND PELVIS WITH CONTRAST TECHNIQUE: Multidetector CT imaging of the chest was performed using the standard protocol during bolus administration of intravenous contrast. Multiplanar CT image reconstructions and MIPs were obtained to evaluate the vascular anatomy. Multidetector CT imaging of the abdomen and pelvis was performed using the standard protocol during bolus administration of intravenous contrast. CONTRAST:  158mL OMNIPAQUE IOHEXOL 350 MG/ML SOLN COMPARISON:  CT angiogram chest December 10, 2018; CT abdomen and pelvis November 29, 2018; chest radiograph January 01, 2019 FINDINGS: CTA CHEST FINDINGS Cardiovascular: There is no demonstrable pulmonary embolus. There is no thoracic aortic aneurysm or dissection. The visualized great vessels appear unremarkable. There is rather minimal pericardial fluid. There is no pericardial thickening. Port-A-Cath tip is in  the superior vena cava. Mediastinum/Nodes: There remains extensive axillary adenopathy on the right. The largest individual lymph node in the right axilla measures 3.4 x 2.7 cm. There is adenopathy in the right paratracheal region with the largest lymph node in this region measuring 2.3 x 2.1 cm. There is soft tissue prominence in the hilum which is inseparable from lung collapse. The appearance is similar to recent study. No esophageal lesions are evident. Lungs/Pleura: Sizable pleural effusion on the right remains with consolidation/compressive atelectasis throughout the right middle and lower lobes. There is airspace consolidation in the medial  aspect of the right upper lobe, stable. On the left, there are areas of relatively mild lower lobe atelectatic change. No consolidation seen on the left. Musculoskeletal: No blastic or lytic bone lesions are evident. No chest wall lesions appreciable. Review of the MIP images confirms the above findings. CT ABDOMEN and PELVIS FINDINGS Hepatobiliary: No focal liver lesions are appreciable. The gallbladder is mildly contracted with gallbladder wall mildly thickened. Gallstones were better seen on recent CT. The proximal common bile duct measures 12 mm which is dilated. The common bile duct tapers distally without mass or calculus evident by radiography. Pancreas: There is no appreciable pancreatic mass or inflammatory focus. Spleen: No splenic lesions are evident. Adrenals/Urinary Tract: Adrenals bilaterally appear normal. Kidneys bilaterally show no evident mass or hydronephrosis on either side. Urinary bladder is midline with wall thickness within normal limits. Stomach/Bowel: There is no appreciable bowel wall or mesenteric thickening. There is no evident bowel obstruction. No free air or portal venous air. The terminal ileum appears unremarkable. Vascular/Lymphatic: There is no abdominal aortic aneurysm. There is aortic atherosclerosis. There are mildly prominent lymph nodes in the pelvis, stable. The largest lymph node is in the right internal iliac node chain measuring 2.4 x 1.7 cm. There are multiple retroperitoneal lymph nodes, similar to prior study with the largest of these retroperitoneal lymph nodes measuring 0.9 cm in short axis diameter. No new lymph node prominence evident. Reproductive: Uterus is retroverted. Leiomyomatous changes in the uterus are grossly stable compared to the previous study. No extrauterine pelvic mass is evident. Other: Appendix not seen. No periappendiceal region inflammation. No abscess or ascites evident in the abdomen or pelvis. Musculoskeletal: No blastic or lytic bone  lesions. No intramuscular or abdominal wall lesions. Review of the MIP images confirms the above findings. IMPRESSION: CT angiogram chest: 1. No demonstrable pulmonary embolus. No thoracic aortic aneurysm or dissection. 2. Sizable right pleural effusion with consolidation and compressive atelectasis throughout much of the right lung. Essentially complete collapse of the right middle and lower lobes remains. Consolidation medial aspect of the right upper lobe is present. 3. Right paratracheal and right axillary adenopathy, similar to recent study. 4.  Left lower lobe atelectatic change. CT abdomen and pelvis: 1. Areas of lymph node prominence, primarily in the right internal iliac chain, stable and likely of neoplastic etiology given the known lung carcinoma. No new adenopathy. 2. Gallbladder wall is thickened. Known gallstones are better seen on recent CT. There is mild biliary duct dilatation. Correlation with ultrasound to better assess the gallbladder advised given the degree of wall thickening in this area. 3.  Leiomyomatous uterus. 4. No bowel obstruction. No abscess in the abdomen or pelvis. No periappendiceal region inflammation. 5. No renal or ureteral calculi. No hydronephrosis. Urinary bladder wall thickness within normal limits. Electronically Signed   By: Lowella Grip III M.D.   On: 01/01/2019 08:12   US Abdomen Limited  Result  Date: 01/01/2019 CLINICAL DATA:  Abdominal pain EXAM: ULTRASOUND ABDOMEN LIMITED RIGHT UPPER QUADRANT COMPARISON:  CT from the same day. FINDINGS: Gallbladder: There is gallbladder wall thickening. There is no pericholecystic free fluid. The sonographic Percell Miller sign is reported as positive. Gallstones are noted measuring up to approximately 1.1 cm. Evaluation of the gallbladder was limited by patient body habitus. The gallbladder appears somewhat contracted. Common bile duct: Diameter: 1.2 cm Liver: Diffuse increased echogenicity with slightly heterogeneous liver.  Appearance typically secondary to fatty infiltration. Fibrosis secondary consideration. No secondary findings of cirrhosis noted. No focal hepatic lesion or intrahepatic biliary duct dilatation. Portal vein is patent on color Doppler imaging with normal direction of blood flow towards the liver. Other: A right-sided pleural effusion is noted. IMPRESSION: 1. Evaluation limited by patient body habitus and poor sonographic windows. 2. There is cholelithiasis with gallbladder wall thickening and a questionable positive sonographic Murphy sign. Findings are concerning for acute calculus cholecystitis in the appropriate clinical setting. HIDA scan may be useful for confirmation given the gallbladder is not significantly distended. 3. Dilated common bile duct. This raises concern for an obstructing process such as choledocholithiasis. Correlation with laboratory studies is recommended. If there is clinical concern for an obstructing process, follow-up with MRCP/ERCP is recommended. 4. Right-sided pleural effusion. 5. Probable underlying hepatic steatosis. Electronically Signed   By: Constance Holster M.D.   On: 01/01/2019 09:33    Pending Labs Unresulted Labs (From admission, onward)    Start     Ordered   01/02/19 0500  Comprehensive metabolic panel  Tomorrow morning,   R     01/01/19 1447   01/02/19 0500  CBC  Tomorrow morning,   R     01/01/19 1447   01/01/19 1500  Type and screen [place],   STAT    Comments: [place]    01/01/19 1459   01/01/19 1500  Vitamin B12  Once,   STAT     01/01/19 1459   01/01/19 1500  TSH  Once,   STAT     01/01/19 1459   01/01/19 1500  T4, free  Once,   STAT     01/01/19 1459   01/01/19 1459  Culture, blood (x 2)  BLOOD CULTURE X 2,   STAT    Comments: INITIATE ANTIBIOTICS WITHIN 1 HOUR AFTER BLOOD CULTURES DRAWN.  If unable to obtain blood cultures, call MD immediately regarding antibiotic instructions.     01/01/19 1459   01/01/19 1459  CBC with Differential  ONCE - STAT,   STAT     01/01/19 1459   01/01/19 1459  Comprehensive metabolic panel  ONCE - STAT,   STAT     01/01/19 1459   01/01/19 1459  Lactic acid, plasma  STAT Now then every 2 hours,   STAT     01/01/19 1459   01/01/19 1459  Protime-INR  ONCE - STAT,   STAT     01/01/19 1459   01/01/19 1459  APTT  ONCE - STAT,   STAT     01/01/19 1459   01/01/19 1459  Procalcitonin  ONCE - STAT,   STAT     01/01/19 1459   01/01/19 1459  Urine culture  ONCE - STAT,   STAT     01/01/19 1459   01/01/19 1459  Culture, sputum-assessment  ONCE - STAT,   R     01/01/19 1459  Vitals/Pain Today's Vitals   01/01/19 1600 01/01/19 1700 01/01/19 1800 01/01/19 1822  BP:  (!) 144/83 (!) 149/93   Pulse: (!) 131  (!) 122 (!) 123  Resp: 20 16 (!) 25 (!) 24  Temp:      TempSrc:      SpO2: 91%   90%  Weight:      Height:      PainSc:        Isolation Precautions No active isolations  Medications Medications  aspirin EC tablet 81 mg (has no administration in time range)  atorvastatin (LIPITOR) tablet 40 mg (has no administration in time range)  DULoxetine (CYMBALTA) DR capsule 60 mg (has no administration in time range)  feeding supplement (ENSURE ENLIVE) (ENSURE ENLIVE) liquid 237 mL (has no administration in time range)  Insulin Degludec SOPN 50 Units (has no administration in time range)  levalbuterol (XOPENEX) nebulizer solution 0.63 mg (has no administration in time range)  levothyroxine (SYNTHROID) tablet 150 mcg (has no administration in time range)  LORazepam (ATIVAN) tablet 0.5 mg (has no administration in time range)  pantoprazole (PROTONIX) EC tablet 40 mg (has no administration in time range)  senna-docusate (Senokot-S) tablet 2 tablet (has no administration in time range)  heparin injection 5,000 Units (has no administration in time range)  lactated ringers infusion ( Intravenous New Bag/Given 01/01/19 1711)  oxyCODONE  (Oxy IR/ROXICODONE) immediate release tablet 5 mg (has no administration in time range)  polyethylene glycol (MIRALAX / GLYCOLAX) packet 17 g (has no administration in time range)  docusate sodium (COLACE) capsule 100 mg (has no administration in time range)  ondansetron (ZOFRAN) tablet 4 mg (has no administration in time range)    Or  ondansetron (ZOFRAN) injection 4 mg (has no administration in time range)  insulin aspart (novoLOG) injection 0-9 Units (2 Units Subcutaneous Given 01/01/19 1710)  piperacillin-tazobactam (ZOSYN) IVPB 3.375 g (3.375 g Intravenous New Bag/Given 01/01/19 1704)    Followed by  piperacillin-tazobactam (ZOSYN) IVPB 3.375 g (has no administration in time range)  morphine 2 MG/ML injection 2-4 mg (has no administration in time range)  metoprolol succinate (TOPROL-XL) 24 hr tablet 50 mg (has no administration in time range)  metoprolol succinate (TOPROL-XL) 24 hr tablet 25 mg (has no administration in time range)  LORazepam (ATIVAN) 2 MG/ML injection (has no administration in time range)  sodium chloride flush (NS) 0.9 % injection 3 mL (3 mLs Intravenous Given 01/01/19 0537)  sodium chloride 0.9 % bolus 1,000 mL (0 mLs Intravenous Stopped 01/01/19 0724)  iohexol (OMNIPAQUE) 350 MG/ML injection 100 mL (100 mLs Intravenous Contrast Given 01/01/19 0701)  sodium chloride (PF) 0.9 % injection (  Given by Other 01/01/19 0725)  metoprolol tartrate (LOPRESSOR) injection 5 mg (5 mg Intravenous Given 01/01/19 0736)  technetium TC 80M mebrofenin (CHOLETEC) injection 5.5 millicurie (5.5 millicuries Intravenous Contrast Given 01/01/19 1345)  morphine 4 MG/ML injection 4 mg (4 mg Intravenous Given 01/01/19 1514)  LORazepam (ATIVAN) injection 0.5 mg (0.5 mg Intravenous Given 01/01/19 1838)    Mobility unknown

## 2019-01-01 NOTE — ED Provider Notes (Signed)
[facility] DEPT Provider Note   CSN: [id number] Arrival date & time: 01/01/19  1517     History   Chief Complaint Chief Complaint  Patient presents with  . Shortness of Breath  . Nausea    HPI Marilyn [name] is a 53 y.o. female.     Patient with history of metastatic lung cancer on chemotherapy.  Her last dose of chemotherapy was on September 23 which she gets every 3 weeks.  She comes in with several day history of progressively worsening shortness of breath where she can barely speak.  Similar presentation several weeks ago requiring thoracentesis for malignant pleural effusion.  She believes this is happening again.  She denies any history of COPD but does have asthma.  Denies any fever or cough.  Denies any chest pain.  Has diffuse abdominal pain which she says is a mixture of new and old.  Denies any vomiting or diarrhea.  No fever.  No pain with urination or blood in the urine. She was told by her doctor she may require a Pleurx catheter if this continues to happen. She is tachycardic and has a history of the same.  The history is provided by the patient. The history is limited by the condition of the patient.    Past Medical History:  Diagnosis Date  . Arthritis   . Asthma   . Diabetes mellitus    Type II  . Hemorrhoids   . Hypercholesteremia   . Hypertension   . Hypothyroidism   . Metastatic lung cancer (metastasis from lung to other site) (HCC)    Lung, Mets to skin- right flank. CT angio 2019 suspicious for axillary, external iliac, right inguinal mets  . 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 NICM (nonischemic cardiomyopathy) ([place])    a. mild - EF 45-50% by echo 10/2016 but EF 52% by low risk nuc 11/2016. b. Echo 07/2017 - EF normal, mild AI, unremarkable echo otherwise.  . Obese   . Pneumonia 2017  . Sinus tachycardia    a. dates back to at least 2013, etiology unclear.    Patient Active Problem List   Diagnosis Date Noted  . Acute respiratory failure with hypoxia ([place]) 12/10/2018  . Pressure injury of skin 11/17/2018  . Pleural effusion   . Acute lower UTI 04/30/2018  . Obese 04/30/2018  . Degenerative cervical spinal stenosis 04/30/2018  . HLD (hyperlipidemia) 04/29/2018  . Asthma 04/29/2018  . Depression 04/29/2018  . Chronic diastolic CHF (congestive heart failure) ([place]) 04/29/2018  . Left arm weakness and numbness 04/29/2018  . Right arm weakness 04/29/2018  . Right sided numbness   . 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 Counseling regarding advance care planning and goals of care 01/03/2018  . Essential hypertension   . DCM (dilated cardiomyopathy) ([place])   . Non-ischemic cardiomyopathy ([place]) 02/04/2017  . Metastasis to skin ([place]) 12/02/2016  . Acute systolic heart failure ([place]) 12/01/2016  . Primary cancer of right lung metastatic to other site ([place]) 11/24/2016  . SOB (shortness of breath) 10/26/2016  . Hypothyroidism 08/22/2016  . PICC (peripherally inserted central catheter) flush 07/16/2016  . Community acquired pneumonia   . Sinus tachycardia   . Thrush of mouth and esophagus ([place]) 04/07/2016  . Constipation 04/07/2016  . Palliative care by specialist   . Port catheter in place 01/19/2016  . Dehydration 01/14/2016  . Tachycardia 01/14/2016  . Adenocarcinoma of  left lung, stage 3 ([place]) 01/01/2016  . Non-small cell lung cancer ([place]) 12/31/2015  . Mediastinal mass   . Chest pain 12/26/2015  . Headache 12/26/2015  . Diabetes mellitus without complication ([place]) 28/00/3491    Past Surgical History:  Procedure Laterality Date  . CESAREAN SECTION    . COLONOSCOPY    . I/D  arm Right 2013  . IR FLUORO GUIDE PORT INSERTION RIGHT  02/18/2017  . IR GENERIC HISTORICAL  01/08/2016   IR US GUIDE VASC ACCESS RIGHT 01/08/2016 [name], DO [facility]INTER[facility]  . IR GENERIC HISTORICAL  01/08/2016   IR FLUORO GUIDE CV LINE RIGHT 01/08/2016 [name], DO [facility]  . IR THORACENTESIS ASP PLEURAL SPACE W/IMG GUIDE  05/25/2018  . IR US GUIDE VASC ACCESS RIGHT  02/18/2017  . MULTIPLE EXTRACTIONS WITH ALVEOLOPLASTY N/A 03/25/2017   Procedure: MULTIPLE EXTRACTION WITH ALVEOLOPLASTY (teeth #s four, six, seven, eight, nine, ten, eleven, one, twenty-three, twenty-four, twenty-five, twenty-six, twenty-nine, thirty);  Surgeon: [name], [name];  Location: [facility];  Service: Oral Surgery;  Laterality: N/A;  . TUBAL LIGATION    . US guided core needle biopsy     of right lower neck/supraclavicular lymph nodes     OB History   No obstetric history on file.      Home Medications    Prior to Admission medications   Medication Sig Start Date End Date Taking? Authorizing Provider  ACCU-CHEK AVIVA PLUS test strip 1 each by Other route 3 (three) times daily.  06/15/18   [provider]  ASPIRIN LOW DOSE 81 MG EC tablet TAKE 1 TABLET BY MOUTH EVERY DAY Patient taking differently: Take 81 mg by mouth daily. Enteric Coated.  ** DO NOT CRUSH ** 03/19/18   [name], MD  atorvastatin (LIPITOR) 40 MG tablet Take 1 tablet (40 mg total) by mouth daily at 6 PM. 04/30/18   Cristal Deer, MD  dexamethasone (DECADRON) 4 MG tablet Take 1 tab two times a day the day before Alimta chemo. Take 2 tabs two times a day starting the day after chemo for 3 days. Patient taking differently: Take 4-8 mg by mouth See admin instructions. Take 4 mg twice daily the day before Alimta chemo, then take 8 mg twice daily starting the day after chemo for 3 days. 09/20/18   [name], MD  diclofenac sodium (VOLTAREN) 1 % GEL Apply 2 g topically 2 (two) times daily as needed (for chestwall pain from costochondiritis). 12/11/18   [name], MD  DULoxetine (CYMBALTA) 60 MG capsule TAKE 1 CAPSULE BY MOUTH EVERY DAY Patient taking differently: Take 60 mg by mouth daily.  12/13/17   [name], MD  feeding supplement, ENSURE ENLIVE,  (ENSURE ENLIVE) LIQD Take 237 mLs by mouth 2 (two) times daily between meals. 12/12/18   Mariel Aloe, MD  folic acid (FOLVITE) 1 MG tablet Take 1 tablet (1 mg total) by mouth daily. Start 5-7 days before Alimta chemotherapy. Continue until 21 days after Alimta completed. 09/20/18   [name], MD  glucose monitoring kit (FREESTYLE) monitoring kit 1 each by Does not apply route as needed for other. 11/05/18   [name], MD  hydrOXYzine (ATARAX/VISTARIL) 10 MG tablet Take 1 tablet (10 mg total) by mouth 3 (three) times daily as needed for itching. 04/26/18   [name], MD  insulin aspart (NOVOLOG) 100 UNIT/ML FlexPen As per sliding scale instructions Patient taking differently: Inject 0-10 Units  into the skin 3 (three) times daily. As per sliding scale instructions 01/16/16   [name], MD  Insulin Degludec (TRESIBA FLEXTOUCH) 200 UNIT/ML SOPN Inject 100 Units into the skin daily before breakfast.     [provider]  levalbuterol (XOPENEX HFA) 45 MCG/ACT inhaler Inhale 1-2 puffs into the lungs every 8 (eight) hours as needed for wheezing. 04/03/18   Gardenia Phlegm, NP  levothyroxine (SYNTHROID) 150 MCG tablet TAKE 1 TABLET BY MOUTH EVERY DAY BEFORE BREAKFAST Patient taking differently: Take 150 mcg by mouth daily before breakfast.  10/26/18   [name], MD  lidocaine-prilocaine (EMLA) cream Apply 1 application topically as needed. Patient taking differently: Apply 1 application topically as needed (port access).  08/23/18   [name], MD  liver oil-zinc oxide (DESITIN) 40 % ointment Apply topically 4 (four) times daily -  with meals and at bedtime. Apply to coccyx/gluteal fold wound Patient not taking: Reported on 12/11/2018 11/17/18   Donne Hazel, MD  LORazepam (ATIVAN) 0.5 MG tablet Take 1 tablet (0.5 mg total) by mouth every 6 (six) hours as needed (Nausea or vomiting). 10/25/18   [name], MD  magnesium  oxide (MAG-OX) 400 (241.3 Mg) MG tablet Take 1 tablet (400 mg total) by mouth 2 (two) times daily. 10/23/18   [name], MD  metoprolol succinate (TOPROL-XL) 50 MG 24 hr tablet Take 1 tablet (36m) by mouth in the morning and 1/2 tablet (2105m in the evening. Take with or immediately following a meal. Patient taking differently: Take 25-50 mg by mouth 2 (two) times daily. Take 5074my mouth in the morning and 41m16m the evening. Take with or immediately following a meal. 08/10/18   MengAlmyra Deforest  mupirocin ointment (BACTROBAN) 2 % Apply topically 2 (two) times daily. Apply into wound bed on right inner upper thigh.  Cover with a small foam dressing. Change the foam daily and prn Patient not taking: Reported on 12/11/2018 11/17/18   ChiuDonne Hazel  naproxen (NAPROSYN) 500 MG tablet Take 1 tablet (500 mg total) by mouth 2 (two) times daily with a meal. Patient not taking: Reported on 08/10/2018 06/21/17   NanaVarney Biles  nitrofurantoin, macrocrystal-monohydrate, (MACROBID) 100 MG capsule Take 1 capsule (100 mg total) by mouth every 12 (twelve) hours. Patient not taking: Reported on 08/10/2018 04/30/18   UgahCristal Deer  ondansetron (ZOFRAN) 8 MG tablet Take 1 tablet (8 mg total) by mouth 2 (two) times daily as needed for refractory nausea / vomiting. Start on day 3 after chemo. 09/20/18   KaleBrunetta Genera  oxyCODONE (OXY IR/ROXICODONE) 5 MG immediate release tablet Take 1 tablet (5 mg total) by mouth every 4 (four) hours as needed for severe pain. 11/17/18   ChiuDonne Hazel  pantoprazole (PROTONIX) 40 MG tablet Take 1 tablet (40 mg total) by mouth daily. 12/11/18   KaleBrunetta Genera  senna-docusate (SENNA S) 8.6-50 MG tablet Take 2 tablets by mouth at bedtime. 12/11/18   KaleBrunetta Genera  traZODone (DESYREL) 50 MG tablet Take 1-2 tablets (50-100 mg total) by mouth at bedtime as needed for sleep. Patient not taking: Reported on 12/11/2018 05/16/17   KaleBrunetta GeneraD  Vitamins A & D (VITAMIN A & D) OINT Apply 1 application topically 2 (two) times daily. Patient taking differently: Apply 1 application topically 2 (two) times daily as needed (itching).  03/06/18   KaleBrunetta Genera  Family History Family History  Problem Relation Age of Onset  . Heart failure Mother   . Hypertension Mother   . Cancer Neg Hx   . Rheumatologic disease Neg Hx     Social History Social History   Tobacco Use  . Smoking status: Never Smoker  . Smokeless tobacco: Never Used  Substance Use Topics  . Alcohol use: No  . Drug use: No     Allergies   Patient has no known allergies.   Review of Systems Review of Systems  Constitutional: Negative for activity change, appetite change and fever.  HENT: Negative for congestion and rhinorrhea.   Eyes: Negative for visual disturbance.  Respiratory: Positive for shortness of breath. Negative for chest tightness.   Cardiovascular: Negative for chest pain and leg swelling.  Gastrointestinal: Negative for abdominal pain, nausea and vomiting.  Genitourinary: Negative for dysuria and hematuria.  Musculoskeletal: Negative for arthralgias and myalgias.  Skin: Negative for rash.  Neurological: Negative for dizziness, weakness and headaches.   all other systems are negative except as noted in the HPI and PMH.     Physical Exam Updated Vital Signs BP (!) 151/97 (BP Location: Left Arm)   Pulse (!) 144   Temp 98.2 F (36.8 C) (Oral)   Resp (!) 24   Ht '5\' 9"'  (1.753 m)   Wt 119.7 kg   LMP 01/21/2016 Comment: pt signed preg test waiver 04/16/16   SpO2 97%   BMI 38.99 kg/m   Physical Exam Vitals signs and nursing note reviewed.  Constitutional:      General: She is in acute distress.     Appearance: She is well-developed. She is obese.     Comments: Chronically ill-appearing  HENT:     Head: Normocephalic and atraumatic.     Mouth/Throat:     Pharynx: No oropharyngeal exudate.  Eyes:      Conjunctiva/sclera: Conjunctivae normal.     Pupils: Pupils are equal, round, and reactive to light.  Neck:     Musculoskeletal: Normal range of motion and neck supple.     Comments: No meningismus. Cardiovascular:     Rate and Rhythm: Regular rhythm. Tachycardia present.     Heart sounds: Normal heart sounds. No murmur.     Comments: Tachycardic to the 140s Pulmonary:     Effort: Pulmonary effort is normal. No respiratory distress.     Breath sounds: Normal breath sounds.     Comments: Diminished at the bases bilaterally Abdominal:     Palpations: Abdomen is soft.     Tenderness: There is no abdominal tenderness. There is no guarding or rebound.  Musculoskeletal: Normal range of motion.        General: No tenderness.  Skin:    General: Skin is warm.  Neurological:     Mental Status: She is alert and oriented to person, place, and time.     Cranial Nerves: No cranial nerve deficit.     Motor: No abnormal muscle tone.     Coordination: Coordination normal.     Comments:  5/5 strength throughout. CN 2-12 intact.Equal grip strength.   Psychiatric:        Behavior: Behavior normal.      ED Treatments / Results  Labs (all labs ordered are listed, but only abnormal results are displayed) Labs Reviewed  COMPREHENSIVE METABOLIC PANEL - Abnormal; Notable for the following components:      Result Value   Sodium 133 (*)    CO2 20 (*)  Glucose, Bld 326 (*)    Calcium 7.8 (*)    Albumin 2.8 (*)    All other components within normal limits  CBC - Abnormal; Notable for the following components:   RBC 2.12 (*)    Hemoglobin 7.3 (*)    HCT 22.5 (*)    MCV 106.1 (*)    MCH 34.4 (*)    RDW 20.3 (*)    All other components within normal limits  SARS CORONAVIRUS 2 (TAT 6-24 HRS)  LIPASE, BLOOD  URINALYSIS, ROUTINE W REFLEX MICROSCOPIC  I-STAT BETA HCG BLOOD, ED ([facility], [facility], [facility] ONLY)  TROPONIN I (HIGH SENSITIVITY)  TROPONIN I (HIGH SENSITIVITY)    EKG EKG Interpretation   Date/Time:  Monday January 01 2019 04:16:08 EDT Ventricular Rate:  144 PR Interval:    QRS Duration: 68 QT Interval:  294 QTC Calculation: 455 R Axis:   14 Text Interpretation:  Sinus tachycardia Nonspecific T abnormalities, lateral leads Baseline wander in lead(s) V1 No significant change was found Confirmed by Ezequiel Essex [phone]) on 01/01/2019 7:18:07 AM   Radiology Dg Chest 2 View  Result Date: 01/01/2019 CLINICAL DATA:  Shortness of breath EXAM: CHEST - 2 VIEW COMPARISON:  Twenty days ago FINDINGS: Right perihilar opacity (likely radiation fibrosis by CT) with volume loss and small to moderate pleural effusion. Pleural fluid has increased from prior. The left lung remains clear. Normal heart size. Port on the right with tip at the SVC. IMPRESSION: Small to moderate right pleural effusion with increase from 3 weeks ago. Electronically Signed   By: Monte Fantasia M.D.   On: 01/01/2019 05:29    Procedures Procedures (including critical care time)  Medications Ordered in ED Medications  sodium chloride flush (NS) 0.9 % injection 3 mL (has no administration in time range)  sodium chloride 0.9 % bolus 1,000 mL (has no administration in time range)     Initial Impression / Assessment and Plan / ED Course  I have reviewed the triage vital signs and the nursing notes.  Pertinent labs & imaging results that were available during my care of the patient were reviewed by me and considered in my medical decision making (see chart for details).       Patient with recurrent malignant pleural effusion presenting with shortness of breath as well as abdominal pain.  Patient tachycardic with history of the same.  Her chest x-ray shows moderate right-sided pleural effusion which does not seem to explain her degree of dyspnea.  Will obtain CT scan to evaluate for pulmonary embolism as well as abdominal CT given her abdominal pain with nausea and vomiting.  She is given a dose of IV  Lopressor for her tachycardia.  Patient will need to be admitted for thoracentesis given her dyspnea. Her labs are reassuring with slight decrease in her hemoglobin after she received chemotherapy 6 days ago.  Care to be transferred at shift change to Dr. Rogene [name].   Marilyn [name] was evaluated in Emergency Department on 01/01/2019 for the symptoms described in the history of present illness. She was evaluated in the context of the global COVID-19 pandemic, which necessitated consideration that the patient might be at risk for infection with the SARS-CoV-2 virus that causes COVID-19. Institutional protocols and algorithms that pertain to the evaluation of patients at risk for COVID-19 are in a state of rapid change based on information released by regulatory bodies including the CDC and federal and state organizations. These policies and algorithms were followed during the  patient's care in the ED.     Final Clinical Impressions(s) / ED Diagnoses   Final diagnoses:  None    ED Discharge Orders    None       Shakeem Stern, Annie Main, MD 01/01/19 [phone]

## 2019-01-01 NOTE — ED Provider Notes (Addendum)
Patient with known metastatic adenocarcinoma.  Patient also with history of significant right pleural effusion.  Based on CT angios today that that is fairly large and most likely the cause of her symptoms.  Patient with similar presentation when she was admitted the beginning of September had thoracentesis and felt better.  However also today CT scan of the abdomen is showing evidence of gallbladder wall thickening.  Patient's white blood cell count and liver function test without any significant abnormalities but she has some tenderness in that area.  So we will get ultrasound to evaluate that further.  And also second troponin is pending.  Patient's had elevation in her troponins in the past.  Not clinically overly concerned about cardiac event.  But will follow-up with second troponin.  Ultimately patient will probably need admission at the very least for thoracentesis again.  Permanent catheter may be a consideration this time.  We will move forward with the admission after the ultrasound results are known.  In the and the second troponin is back.   Fredia Sorrow, MD 01/01/19 [phone]   Discussed with Dr. Irene Limbo her hematologist oncologist.  Recommending admission for thoracentesis again.  In addition there was some concern about the CT findings regarding the gallbladder.  Ultrasound was done which still shows gallstones a little widening of the common bile duct and some thickening of the gallbladder wall.  However patient has no leukocytosis no liver function test abnormalities at all.  Although the ultrasound findings could be consistent with acute cholecystitis.  There is no evidence of that really on labs there is also no evidence of any obstruction of the common bile duct.   Patient's 2 troponins both were at 11.  Patient states she is discomfort more around the umbilical area since Friday.  Does not have any tenderness on exam currently of any significance in the right upper quadrant.  With  discomfort being there for now 3 days would expect some changes in the labs.  In addition patient CT angios of the chest did not show any pulmonary embolus.  Discussed with the hospitalist for admission    Fredia Sorrow, MD 01/01/19 1043    Fredia Sorrow, MD 01/01/19 1051    Fredia Sorrow, MD 01/01/19 1057

## 2019-01-01 NOTE — ED Notes (Signed)
Patient transported to CT 

## 2019-01-01 NOTE — ED Notes (Addendum)
Pt c/o chronic abdominal pain/distention, constipation x 4 days, and nausea x 3 days.  Pain score 7/10.  Also, Pt reports "staying short of breath" and reports SOB increases w/ lying on R side.  Reports she feels this way when she has "too much fluid."  Pt reports taking medication for constipation w/o relief.

## 2019-01-02 ENCOUNTER — Observation Stay (HOSPITAL_COMMUNITY): Payer: Medicare Other

## 2019-01-02 ENCOUNTER — Other Ambulatory Visit: Payer: Self-pay

## 2019-01-02 ENCOUNTER — Encounter (HOSPITAL_COMMUNITY): Payer: Self-pay | Admitting: Oncology

## 2019-01-02 DIAGNOSIS — Z4682 Encounter for fitting and adjustment of non-vascular catheter: Secondary | ICD-10-CM | POA: Diagnosis not present

## 2019-01-02 DIAGNOSIS — Z9889 Other specified postprocedural states: Secondary | ICD-10-CM | POA: Diagnosis not present

## 2019-01-02 DIAGNOSIS — K649 Unspecified hemorrhoids: Secondary | ICD-10-CM | POA: Diagnosis not present

## 2019-01-02 DIAGNOSIS — I5032 Chronic diastolic (congestive) heart failure: Secondary | ICD-10-CM | POA: Diagnosis not present

## 2019-01-02 DIAGNOSIS — Z9181 History of falling: Secondary | ICD-10-CM | POA: Diagnosis not present

## 2019-01-02 DIAGNOSIS — Z79891 Long term (current) use of opiate analgesic: Secondary | ICD-10-CM | POA: Diagnosis not present

## 2019-01-02 DIAGNOSIS — E119 Type 2 diabetes mellitus without complications: Secondary | ICD-10-CM | POA: Diagnosis not present

## 2019-01-02 DIAGNOSIS — D6481 Anemia due to antineoplastic chemotherapy: Secondary | ICD-10-CM | POA: Diagnosis present

## 2019-01-02 DIAGNOSIS — E785 Hyperlipidemia, unspecified: Secondary | ICD-10-CM | POA: Diagnosis not present

## 2019-01-02 DIAGNOSIS — D62 Acute posthemorrhagic anemia: Secondary | ICD-10-CM | POA: Diagnosis not present

## 2019-01-02 DIAGNOSIS — A4151 Sepsis due to Escherichia coli [E. coli]: Secondary | ICD-10-CM | POA: Diagnosis not present

## 2019-01-02 DIAGNOSIS — Z7952 Long term (current) use of systemic steroids: Secondary | ICD-10-CM | POA: Diagnosis not present

## 2019-01-02 DIAGNOSIS — F329 Major depressive disorder, single episode, unspecified: Secondary | ICD-10-CM | POA: Diagnosis not present

## 2019-01-02 DIAGNOSIS — K8012 Calculus of gallbladder with acute and chronic cholecystitis without obstruction: Secondary | ICD-10-CM | POA: Diagnosis not present

## 2019-01-02 DIAGNOSIS — C3491 Malignant neoplasm of unspecified part of right bronchus or lung: Secondary | ICD-10-CM | POA: Diagnosis not present

## 2019-01-02 DIAGNOSIS — R0602 Shortness of breath: Secondary | ICD-10-CM | POA: Diagnosis not present

## 2019-01-02 DIAGNOSIS — I10 Essential (primary) hypertension: Secondary | ICD-10-CM | POA: Diagnosis not present

## 2019-01-02 DIAGNOSIS — E669 Obesity, unspecified: Secondary | ICD-10-CM | POA: Diagnosis not present

## 2019-01-02 DIAGNOSIS — J9601 Acute respiratory failure with hypoxia: Secondary | ICD-10-CM | POA: Diagnosis not present

## 2019-01-02 DIAGNOSIS — J910 Malignant pleural effusion: Secondary | ICD-10-CM | POA: Diagnosis not present

## 2019-01-02 DIAGNOSIS — D638 Anemia in other chronic diseases classified elsewhere: Secondary | ICD-10-CM | POA: Diagnosis present

## 2019-01-02 DIAGNOSIS — Y842 Radiological procedure and radiotherapy as the cause of abnormal reaction of the patient, or of later complication, without mention of misadventure at the time of the procedure: Secondary | ICD-10-CM | POA: Diagnosis present

## 2019-01-02 DIAGNOSIS — Z6837 Body mass index (BMI) 37.0-37.9, adult: Secondary | ICD-10-CM | POA: Diagnosis not present

## 2019-01-02 DIAGNOSIS — M1990 Unspecified osteoarthritis, unspecified site: Secondary | ICD-10-CM | POA: Diagnosis not present

## 2019-01-02 DIAGNOSIS — Z1612 Extended spectrum beta lactamase (ESBL) resistance: Secondary | ICD-10-CM | POA: Diagnosis not present

## 2019-01-02 DIAGNOSIS — C792 Secondary malignant neoplasm of skin: Secondary | ICD-10-CM | POA: Diagnosis present

## 2019-01-02 DIAGNOSIS — D61818 Other pancytopenia: Secondary | ICD-10-CM | POA: Diagnosis not present

## 2019-01-02 DIAGNOSIS — R000 Tachycardia, unspecified: Secondary | ICD-10-CM | POA: Diagnosis present

## 2019-01-02 DIAGNOSIS — R1084 Generalized abdominal pain: Secondary | ICD-10-CM | POA: Diagnosis not present

## 2019-01-02 DIAGNOSIS — Z20828 Contact with and (suspected) exposure to other viral communicable diseases: Secondary | ICD-10-CM | POA: Diagnosis not present

## 2019-01-02 DIAGNOSIS — Z452 Encounter for adjustment and management of vascular access device: Secondary | ICD-10-CM | POA: Diagnosis not present

## 2019-01-02 DIAGNOSIS — C3490 Malignant neoplasm of unspecified part of unspecified bronchus or lung: Secondary | ICD-10-CM | POA: Diagnosis not present

## 2019-01-02 DIAGNOSIS — K8050 Calculus of bile duct without cholangitis or cholecystitis without obstruction: Secondary | ICD-10-CM | POA: Diagnosis not present

## 2019-01-02 DIAGNOSIS — D649 Anemia, unspecified: Secondary | ICD-10-CM | POA: Diagnosis not present

## 2019-01-02 DIAGNOSIS — I429 Cardiomyopathy, unspecified: Secondary | ICD-10-CM | POA: Diagnosis not present

## 2019-01-02 DIAGNOSIS — J45909 Unspecified asthma, uncomplicated: Secondary | ICD-10-CM | POA: Diagnosis not present

## 2019-01-02 DIAGNOSIS — C7801 Secondary malignant neoplasm of right lung: Secondary | ICD-10-CM | POA: Diagnosis not present

## 2019-01-02 DIAGNOSIS — D701 Agranulocytosis secondary to cancer chemotherapy: Secondary | ICD-10-CM | POA: Diagnosis not present

## 2019-01-02 DIAGNOSIS — C3492 Malignant neoplasm of unspecified part of left bronchus or lung: Secondary | ICD-10-CM | POA: Diagnosis not present

## 2019-01-02 DIAGNOSIS — T451X5A Adverse effect of antineoplastic and immunosuppressive drugs, initial encounter: Secondary | ICD-10-CM | POA: Diagnosis not present

## 2019-01-02 DIAGNOSIS — K810 Acute cholecystitis: Secondary | ICD-10-CM | POA: Diagnosis not present

## 2019-01-02 DIAGNOSIS — N390 Urinary tract infection, site not specified: Secondary | ICD-10-CM | POA: Diagnosis not present

## 2019-01-02 DIAGNOSIS — T451X5D Adverse effect of antineoplastic and immunosuppressive drugs, subsequent encounter: Secondary | ICD-10-CM | POA: Diagnosis not present

## 2019-01-02 DIAGNOSIS — K8020 Calculus of gallbladder without cholecystitis without obstruction: Secondary | ICD-10-CM | POA: Diagnosis not present

## 2019-01-02 DIAGNOSIS — Z7982 Long term (current) use of aspirin: Secondary | ICD-10-CM | POA: Diagnosis not present

## 2019-01-02 DIAGNOSIS — G620 Drug-induced polyneuropathy: Secondary | ICD-10-CM | POA: Diagnosis not present

## 2019-01-02 DIAGNOSIS — E039 Hypothyroidism, unspecified: Secondary | ICD-10-CM | POA: Diagnosis present

## 2019-01-02 DIAGNOSIS — J90 Pleural effusion, not elsewhere classified: Secondary | ICD-10-CM

## 2019-01-02 DIAGNOSIS — C7989 Secondary malignant neoplasm of other specified sites: Secondary | ICD-10-CM | POA: Diagnosis present

## 2019-01-02 DIAGNOSIS — B9629 Other Escherichia coli [E. coli] as the cause of diseases classified elsewhere: Secondary | ICD-10-CM | POA: Diagnosis not present

## 2019-01-02 DIAGNOSIS — Z794 Long term (current) use of insulin: Secondary | ICD-10-CM | POA: Diagnosis not present

## 2019-01-02 DIAGNOSIS — I110 Hypertensive heart disease with heart failure: Secondary | ICD-10-CM | POA: Diagnosis present

## 2019-01-02 DIAGNOSIS — E1140 Type 2 diabetes mellitus with diabetic neuropathy, unspecified: Secondary | ICD-10-CM | POA: Diagnosis present

## 2019-01-02 DIAGNOSIS — R05 Cough: Secondary | ICD-10-CM | POA: Diagnosis not present

## 2019-01-02 DIAGNOSIS — E7800 Pure hypercholesterolemia, unspecified: Secondary | ICD-10-CM | POA: Diagnosis not present

## 2019-01-02 DIAGNOSIS — R319 Hematuria, unspecified: Secondary | ICD-10-CM | POA: Diagnosis not present

## 2019-01-02 DIAGNOSIS — I5033 Acute on chronic diastolic (congestive) heart failure: Secondary | ICD-10-CM | POA: Diagnosis present

## 2019-01-02 DIAGNOSIS — R1011 Right upper quadrant pain: Secondary | ICD-10-CM | POA: Diagnosis not present

## 2019-01-02 DIAGNOSIS — I428 Other cardiomyopathies: Secondary | ICD-10-CM | POA: Diagnosis present

## 2019-01-02 DIAGNOSIS — Z8701 Personal history of pneumonia (recurrent): Secondary | ICD-10-CM | POA: Diagnosis not present

## 2019-01-02 LAB — BODY FLUID CELL COUNT WITH DIFFERENTIAL
Eos, Fluid: 0 %
Lymphs, Fluid: 11 %
Monocyte-Macrophage-Serous Fluid: 8 % — ABNORMAL LOW (ref 50–90)
Neutrophil Count, Fluid: 81 % — ABNORMAL HIGH (ref 0–25)
Total Nucleated Cell Count, Fluid: 715 cu mm (ref 0–1000)

## 2019-01-02 LAB — GLUCOSE, CAPILLARY
Glucose-Capillary: 149 mg/dL — ABNORMAL HIGH (ref 70–99)
Glucose-Capillary: 139 mg/dL — ABNORMAL HIGH (ref 70–99)
Glucose-Capillary: 136 mg/dL — ABNORMAL HIGH (ref 70–99)
Glucose-Capillary: 126 mg/dL — ABNORMAL HIGH (ref 70–99)
Glucose-Capillary: 127 mg/dL — ABNORMAL HIGH (ref 70–99)

## 2019-01-02 LAB — CBC
HCT: 21 % — ABNORMAL LOW (ref 36.0–46.0)
Hemoglobin: 6.9 g/dL — CL (ref 12.0–15.0)
MCH: 34.8 pg — ABNORMAL HIGH (ref 26.0–34.0)
MCHC: 32.9 g/dL (ref 30.0–36.0)
MCV: 106.1 fL — ABNORMAL HIGH (ref 80.0–100.0)
Platelets: 183 10*3/uL (ref 150–400)
RBC: 1.98 MIL/uL — ABNORMAL LOW (ref 3.87–5.11)
RDW: 20.6 % — ABNORMAL HIGH (ref 11.5–15.5)
WBC: 3.1 10*3/uL — ABNORMAL LOW (ref 4.0–10.5)
nRBC: 0 % (ref 0.0–0.2)

## 2019-01-02 LAB — COMPREHENSIVE METABOLIC PANEL
ALT: 14 U/L (ref 0–44)
AST: 24 U/L (ref 15–41)
Albumin: 2.7 g/dL — ABNORMAL LOW (ref 3.5–5.0)
Alkaline Phosphatase: 87 U/L (ref 38–126)
Anion gap: 9 (ref 5–15)
BUN: 7 mg/dL (ref 6–20)
CO2: 24 mmol/L (ref 22–32)
Calcium: 7.7 mg/dL — ABNORMAL LOW (ref 8.9–10.3)
Chloride: 106 mmol/L (ref 98–111)
Creatinine, Ser: 0.79 mg/dL (ref 0.44–1.00)
GFR calc Af Amer: >60 mL/min (ref >60)
GFR calc non Af Amer: >60 mL/min (ref >60)
Glucose, Bld: 156 mg/dL — ABNORMAL HIGH (ref 70–99)
Potassium: 3.6 mmol/L (ref 3.5–5.1)
Sodium: 139 mmol/L (ref 135–145)
Total Bilirubin: 0.7 mg/dL (ref 0.3–1.2)
Total Protein: 7.1 g/dL (ref 6.5–8.1)

## 2019-01-02 LAB — HEMOGLOBIN AND HEMATOCRIT, BLOOD
HCT: 25.6 % — ABNORMAL LOW (ref 36.0–46.0)
Hemoglobin: 8.5 g/dL — ABNORMAL LOW (ref 12.0–15.0)

## 2019-01-02 LAB — GRAM STAIN

## 2019-01-02 LAB — PROTEIN, PLEURAL OR PERITONEAL FLUID: Total protein, fluid: 4.8 g/dL

## 2019-01-02 LAB — ABO/RH: ABO/RH(D): A POS

## 2019-01-02 LAB — LACTATE DEHYDROGENASE, PLEURAL OR PERITONEAL FLUID: LD, Fluid: 322 U/L — ABNORMAL HIGH (ref 3–23)

## 2019-01-02 LAB — PREPARE RBC (CROSSMATCH)

## 2019-01-02 LAB — T4, FREE: Free T4: 0.55 ng/dL — ABNORMAL LOW (ref 0.61–1.12)

## 2019-01-02 MED ORDER — LIDOCAINE HCL 1 % IJ SOLN
INTRAMUSCULAR | Status: AC
  Filled 2019-01-02: qty 20

## 2019-01-02 MED ORDER — BISACODYL 10 MG RE SUPP
10.0000 mg | Freq: Once | RECTAL | Status: AC
  Administered 2019-01-02: 10 mg via RECTAL
  Filled 2019-01-02: qty 1

## 2019-01-02 MED ORDER — SODIUM CHLORIDE 0.9% IV SOLUTION: Freq: Once | INTRAVENOUS | Status: DC

## 2019-01-02 MED ORDER — LACTULOSE 10 GM/15ML PO SOLN
20.0000 g | ORAL | Status: AC
  Filled 2019-01-02: qty 30

## 2019-01-02 MED ORDER — SODIUM CHLORIDE 0.9% FLUSH
10.0000 mL | INTRAVENOUS | Status: DC | PRN
  Administered 2019-01-02 – 2019-01-20 (×2): 10 mL
  Filled 2019-01-02: qty 40

## 2019-01-02 NOTE — Consult Note (Addendum)
[place]  [phone] [phone]   MEDICAL ONCOLOGY - CONSULTATION  Referral MD: Dr. Marylu Lund  Reason for Referral: Metastatic lung cancer, recurrent pleural effusion  HPI: Marilyn [name] is a 53 year old female with a past medical history significant for metastatic lung adenocarcinoma, nonischemic cardiomyopathy, hypertension, hypothyroidism, malignant pleural effusion requiring thoracentesis x4 (including thoracentesis performed this admission), and hypothyroidism.  She has been receiving systemic treatment for her lung cancer and received her last infusion consisting of Alimta, and Avastin on 12/27/2018.  She presented to the emergency room with abdominal pain which had been present for approximately 3 months but had worsened over the past 3 days.  She had nausea and constipation associated with the abdominal pain.  She also had persistent shortness of breath and chronic cough.  In the emergency room, she had a chest x-ray which showed a small to moderate right pleural effusion.  She had a CT angiogram of the chest and a CT of the abdomen and pelvis which showed no pulmonary embolism, sizable right pleural effusion with consolidation and compressive atelectasis throughout much of the right lung, essentially complete collapse of the right middle and lower lobs, consolidation medial aspect of the right upper lobe is present, similar right paratracheal and right axillary adenopathy, areas of lymph node prominence primarily in the right internal iliac vein which is stable, gallbladder wall thickened with mild biliary duct dilatation, no bowel obstruction.  She had an ultrasound of the abdomen which showed cholelithiasis with gallbladder wall thickening and questionable positive sonographic Murphy sign.  Findings concerning for acute calculus cholecystitis.  There was a dilated common bile duct raising the concern for obstructing process such as choledocholithiasis.  She  underwent MRCP which showed cholelithiasis with common duct dilatation and retrocrural adenopathy suspicious for nodal metastasis in the patient with a history of lung cancer.  General surgery was consulted by telephone regarding this patient who recommends cholecystectomy but given history of active chemotherapy would recommend holding off and doing this as an outpatient in the clinic.  She was started on IV Zosyn.  The patient underwent a right thoracentesis by interventional radiology with 920 cc of pleural fluid removed.  When seen today, the patient reports ongoing abdominal pain.  She is having some nausea and vomiting at the time of my visit.  Her abdominal pain is primarily in her right upper quadrant.  She also reports right-sided chest pain following the thoracentesis.  Breathing has improved since thoracentesis.  Reports anorexia secondary to her abdominal pain, nausea, and vomiting.  She reports constipation.  Denies headaches and dizziness.  No bleeding noted.  Medical oncology was asked see the patient to make recommendations regarding her recent treatment for metastatic lung cancer and her recurrent pleural effusion.    Past Medical History:  Diagnosis Date  . Arthritis   . Asthma   . Diabetes mellitus    Type II  . Hemorrhoids   . Hypercholesteremia   . Hypertension   . Hypothyroidism   . Metastatic lung cancer (metastasis from lung to other site) (HCC)    Lung, Mets to skin- right flank. CT angio 2019 suspicious for axillary, external iliac, right inguinal mets  . 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 NICM (nonischemic cardiomyopathy) ([place])    a. mild - EF 45-50% by echo 10/2016 but EF 52% by low risk nuc 11/2016. b. Echo 07/2017 - EF normal, mild AI, unremarkable echo otherwise.  . Obese   .  Pneumonia 2017  . Sinus tachycardia    a. dates back to at least 2013, etiology unclear.  :  Past Surgical History:  Procedure Laterality Date  . CESAREAN SECTION     . COLONOSCOPY    . I/D  arm Right 2013  . IR FLUORO GUIDE PORT INSERTION RIGHT  02/18/2017  . IR GENERIC HISTORICAL  01/08/2016   IR US GUIDE VASC ACCESS RIGHT 01/08/2016 [name], DO [facility]  . IR GENERIC HISTORICAL  01/08/2016   IR FLUORO GUIDE CV LINE RIGHT 01/08/2016 [name], DO [facility]  . IR THORACENTESIS ASP PLEURAL SPACE W/IMG GUIDE  05/25/2018  . IR US GUIDE VASC ACCESS RIGHT  02/18/2017  . MULTIPLE EXTRACTIONS WITH ALVEOLOPLASTY N/A 03/25/2017   Procedure: MULTIPLE EXTRACTION WITH ALVEOLOPLASTY (teeth #s four, six, seven, eight, nine, ten, eleven, one, twenty-three, twenty-four, twenty-five, twenty-six, twenty-nine, thirty);  Surgeon: [name], [name];  Location: [facility];  Service: Oral Surgery;  Laterality: N/A;  . TUBAL LIGATION    . US guided core needle biopsy     of right lower neck/supraclavicular lymph nodes  :  Current Facility-Administered Medications  Medication Dose Route Frequency Provider Last Rate Last Dose  . 0.9 %  sodium chloride infusion (Manually program via Guardrails IV Fluids)   Intravenous Once Donne Hazel, MD      . aspirin EC tablet 81 mg  81 mg Oral Daily Lavina Hamman, MD      . atorvastatin (LIPITOR) tablet 40 mg  40 mg Oral q1800 Lavina Hamman, MD      . Chlorhexidine Gluconate Cloth 2 % PADS 6 each  6 each Topical Daily Lavina Hamman, MD      . docusate sodium (COLACE) capsule 100 mg  100 mg Oral BID Lavina Hamman, MD   100 mg at 01/02/19 1022  . DULoxetine (CYMBALTA) DR capsule 60 mg  60 mg Oral Daily Lavina Hamman, MD   60 mg at 01/02/19 1022  . feeding supplement (ENSURE ENLIVE) (ENSURE ENLIVE) liquid 237 mL  237 mL Oral BID BM Lavina Hamman, MD      . heparin injection 5,000 Units  5,000 Units Subcutaneous Q8H Lavina Hamman, MD   Stopped at 01/01/19 2218  . insulin aspart (novoLOG) injection 0-9 Units  0-9 Units Subcutaneous Q4H Lavina Hamman, MD   1 Units at 01/02/19 0827  . insulin glargine (LANTUS)  injection 50 Units  50 Units Subcutaneous QAC breakfast Lavina Hamman, MD   50 Units at 01/02/19 0827  . lactated ringers infusion   Intravenous Continuous Lavina Hamman, MD 75 mL/hr at 01/02/19 0219    . levalbuterol (XOPENEX) nebulizer solution 0.63 mg  0.63 mg Nebulization Q8H PRN Lavina Hamman, MD      . levothyroxine (SYNTHROID) tablet 150 mcg  150 mcg Oral QAC breakfast Lavina Hamman, MD   150 mcg at 01/02/19 0544  . lidocaine (XYLOCAINE) 1 % (with pres) injection           . LORazepam (ATIVAN) tablet 0.5 mg  0.5 mg Oral Q6H PRN Lavina Hamman, MD      . metoprolol succinate (TOPROL-XL) 24 hr tablet 25 mg  25 mg Oral QPM Wofford, Cindie Laroche, RPH      . metoprolol succinate (TOPROL-XL) 24 hr tablet 50 mg  50 mg Oral Daily Reuel Boom A, RPH   50 mg at 01/02/19 1022  . morphine 2 MG/ML injection 2-4  mg  2-4 mg Intravenous Q2H PRN Lavina Hamman, MD   2 mg at 01/02/19 1239  . ondansetron (ZOFRAN) tablet 4 mg  4 mg Oral Q6H PRN Lavina Hamman, MD       Or  . ondansetron [facility]) injection 4 mg  4 mg Intravenous Q6H PRN Lavina Hamman, MD   4 mg at 01/02/19 1246  . oxyCODONE (Oxy IR/ROXICODONE) immediate release tablet 5 mg  5 mg Oral Q4H PRN Lavina Hamman, MD      . pantoprazole (PROTONIX) EC tablet 40 mg  40 mg Oral Daily Lavina Hamman, MD   40 mg at 01/02/19 1022  . piperacillin-tazobactam (ZOSYN) IVPB 3.375 g  3.375 g Intravenous Q8H Wofford, Drew A, RPH 12.5 mL/hr at 01/02/19 0544 3.375 g at 01/02/19 0544  . polyethylene glycol (MIRALAX / GLYCOLAX) packet 17 g  17 g Oral Daily Lavina Hamman, MD      . senna-docusate (Senokot-S) tablet 2 tablet  2 tablet Oral QHS Lavina Hamman, MD       Facility-Administered Medications Ordered in Other Encounters  Medication Dose Route Frequency Provider Last Rate Last Dose  . sodium chloride 0.9 % injection 10 mL  10 mL Intravenous PRN [name], MD   10 mL at 05/24/16 1252     No Known Allergies:  Family History   Problem Relation Age of Onset  . Heart failure Mother   . Hypertension Mother   . Cancer Neg Hx   . Rheumatologic disease Neg Hx   :  Social History   Socioeconomic History  . Marital status: Divorced    Spouse name: Not on file  . Number of children: Not on file  . Years of education: Not on file  . Highest education level: Not on file  Occupational History  . Not on file  Social Needs  . Financial resource strain: Not on file  . Food insecurity    Worry: Not on file    Inability: Not on file  . Transportation needs    Medical: No    Non-medical: No  Tobacco Use  . Smoking status: Never Smoker  . Smokeless tobacco: Never Used  Substance and Sexual Activity  . Alcohol use: No  . Drug use: No  . Sexual activity: Not Currently  Lifestyle  . Physical activity    Days per week: Not on file    Minutes per session: Not on file  . Stress: Not on file  Relationships  . Social Herbalist on phone: Not on file    Gets together: Not on file    Attends religious service: Not on file    Active member of club or organization: Not on file    Attends meetings of clubs or organizations: Not on file    Relationship status: Not on file  . Intimate partner violence    Fear of current or ex partner: No    Emotionally abused: No    Physically abused: No    Forced sexual activity: No  Other Topics Concern  . Not on file  Social History Narrative   No bird or mold exposure. No recent travel.   11-23-17 Unable to ask abuse questions mother with her today.  :  Review of Systems: A comprehensive 14 point review of systems was negative except as noted in the HPI.  Exam: Patient Vitals for the past 24 hrs:  BP Temp Temp src Pulse Resp  SpO2  01/02/19 1244 (!) 166/76 97.9 F (36.6 C) Oral 81 (!) 22 97 %  01/02/19 1152 (!) 159/92 - - - - -  01/02/19 1129 (!) 143/87 - - - - -  01/02/19 0806 (!) 152/96 98.8 F (37.1 C) Oral (!) 131 - 98 %  01/02/19 0550 (!) 153/87 (!)  100.6 F (38.1 C) Oral (!) 135 18 93 %  01/02/19 0353 (!) 154/96 99 F (37.2 C) Oral (!) 135 18 90 %  01/01/19 2031 (!) 132/91 97.8 F (36.6 C) Oral (!) 129 20 97 %  01/01/19 1822 - - - (!) 123 (!) 24 90 %  01/01/19 1800 (!) 149/93 - - (!) 122 (!) 25 -  01/01/19 1700 (!) 144/83 - - - 16 -  01/01/19 1600 - - - (!) 131 20 91 %  01/01/19 1500 (!) 161/90 - - (!) 132 (!) 26 91 %  01/01/19 1400 (!) 150/96 - - (!) 127 (!) 28 95 %  01/01/19 1328 - - - (!) 118 19 96 %    General: Awake and alert, appears to have pain in her abdomen with nausea at the time of my visit. Eyes:  no scleral icterus.   ENT:  There were no oropharyngeal lesions.   Neck was without thyromegaly.   Lymphatics:  Negative cervical, supraclavicular or axillary adenopathy.   Respiratory: lungs were clear bilaterally without wheezing or crackles.   Cardiovascular:  Regular rate and rhythm, S1/S2, without murmur, rub or gallop.  There was no pedal edema.   GI:  abdomen was soft, flat, nontender, nondistended, without organomegaly.   Musculoskeletal:  no spinal tenderness of palpation of vertebral spine.   Skin exam was without echymosis, petichae.   Neuro exam was nonfocal. Patient was alert and oriented.  Attention was good.   Language was appropriate.  Mood was normal without depression.  Speech was not pressured.  Thought content was not tangential.     Lab Results  Component Value Date   WBC 3.1 (L) 01/02/2019   HGB 6.9 (LL) 01/02/2019   HCT 21.0 (L) 01/02/2019   PLT 183 01/02/2019   GLUCOSE 156 (H) 01/02/2019   ALT 14 01/02/2019   AST 24 01/02/2019   NA 139 01/02/2019   K 3.6 01/02/2019   CL 106 01/02/2019   CREATININE 0.79 01/02/2019   BUN 7 01/02/2019   CO2 24 01/02/2019    Dg Chest 1 View  Result Date: 01/02/2019 CLINICAL DATA:  Post thoracentesis EXAM: CHEST  1 VIEW COMPARISON:  01/01/2019 FINDINGS: Status post right thoracentesis with decreasing right effusion. No pneumothorax. Medial right upper lobe  suprahilar mass again noted, unchanged. Right Port-A-Cath is unchanged. Left lung clear. Heart is normal size. IMPRESSION: Decreasing right effusion following thoracentesis. No pneumothorax. Otherwise no change. Electronically Signed   By: Rolm Baptise M.D.   On: 01/02/2019 12:16   Dg Chest 1 View  Result Date: 12/11/2018 CLINICAL DATA:  Post right-sided thoracentesis. EXAM: CHEST  1 VIEW COMPARISON:  Chest radiograph-12/10/2018; chest CT-12/10/2018 FINDINGS: Grossly unchanged cardiac silhouette and mediastinal contours with obscuration of the right hilum secondary to consolidative opacities, volume loss and architectural distortion as demonstrated on preceding chest CT. No change to slight reduction of right-sided pleural effusion post thoracentesis. No pneumothorax. The left hemithorax remains well aerated. No new focal airspace opacities. No evidence of edema. Stable position of support apparatus. No acute osseous abnormalities. IMPRESSION: 1. No change to slight reduction of right-sided pleural effusion post thoracentesis. No pneumothorax.  2. Persistent obscuration of the right hilum secondary to consolidative opacities, volume loss and architectural distortion. Electronically Signed   By: Sandi Mariscal M.D.   On: 12/11/2018 11:33   Dg Chest 2 View  Result Date: 01/01/2019 CLINICAL DATA:  Shortness of breath EXAM: CHEST - 2 VIEW COMPARISON:  Twenty days ago FINDINGS: Right perihilar opacity (likely radiation fibrosis by CT) with volume loss and small to moderate pleural effusion. Pleural fluid has increased from prior. The left lung remains clear. Normal heart size. Port on the right with tip at the SVC. IMPRESSION: Small to moderate right pleural effusion with increase from 3 weeks ago. Electronically Signed   By: Monte Fantasia M.D.   On: 01/01/2019 05:29   Ct Angio Chest Pe W And/or Wo Contrast  Result Date: 01/01/2019 CLINICAL DATA:  Shortness of breath. Stage IV lung carcinoma. Abdominal pain with  nausea EXAM: CT ANGIOGRAPHY CHEST CT ABDOMEN AND PELVIS WITH CONTRAST TECHNIQUE: Multidetector CT imaging of the chest was performed using the standard protocol during bolus administration of intravenous contrast. Multiplanar CT image reconstructions and MIPs were obtained to evaluate the vascular anatomy. Multidetector CT imaging of the abdomen and pelvis was performed using the standard protocol during bolus administration of intravenous contrast. CONTRAST:  110m OMNIPAQUE IOHEXOL 350 MG/ML SOLN COMPARISON:  CT angiogram chest December 10, 2018; CT abdomen and pelvis November 29, 2018; chest radiograph January 01, 2019 FINDINGS: CTA CHEST FINDINGS Cardiovascular: There is no demonstrable pulmonary embolus. There is no thoracic aortic aneurysm or dissection. The visualized great vessels appear unremarkable. There is rather minimal pericardial fluid. There is no pericardial thickening. Port-A-Cath tip is in the superior vena cava. Mediastinum/Nodes: There remains extensive axillary adenopathy on the right. The largest individual lymph node in the right axilla measures 3.4 x 2.7 cm. There is adenopathy in the right paratracheal region with the largest lymph node in this region measuring 2.3 x 2.1 cm. There is soft tissue prominence in the hilum which is inseparable from lung collapse. The appearance is similar to recent study. No esophageal lesions are evident. Lungs/Pleura: Sizable pleural effusion on the right remains with consolidation/compressive atelectasis throughout the right middle and lower lobes. There is airspace consolidation in the medial aspect of the right upper lobe, stable. On the left, there are areas of relatively mild lower lobe atelectatic change. No consolidation seen on the left. Musculoskeletal: No blastic or lytic bone lesions are evident. No chest wall lesions appreciable. Review of the MIP images confirms the above findings. CT ABDOMEN and PELVIS FINDINGS Hepatobiliary: No focal liver  lesions are appreciable. The gallbladder is mildly contracted with gallbladder wall mildly thickened. Gallstones were better seen on recent CT. The proximal common bile duct measures 12 mm which is dilated. The common bile duct tapers distally without mass or calculus evident by radiography. Pancreas: There is no appreciable pancreatic mass or inflammatory focus. Spleen: No splenic lesions are evident. Adrenals/Urinary Tract: Adrenals bilaterally appear normal. Kidneys bilaterally show no evident mass or hydronephrosis on either side. Urinary bladder is midline with wall thickness within normal limits. Stomach/Bowel: There is no appreciable bowel wall or mesenteric thickening. There is no evident bowel obstruction. No free air or portal venous air. The terminal ileum appears unremarkable. Vascular/Lymphatic: There is no abdominal aortic aneurysm. There is aortic atherosclerosis. There are mildly prominent lymph nodes in the pelvis, stable. The largest lymph node is in the right internal iliac node chain measuring 2.4 x 1.7 cm. There are multiple retroperitoneal lymph nodes,  similar to prior study with the largest of these retroperitoneal lymph nodes measuring 0.9 cm in short axis diameter. No new lymph node prominence evident. Reproductive: Uterus is retroverted. Leiomyomatous changes in the uterus are grossly stable compared to the previous study. No extrauterine pelvic mass is evident. Other: Appendix not seen. No periappendiceal region inflammation. No abscess or ascites evident in the abdomen or pelvis. Musculoskeletal: No blastic or lytic bone lesions. No intramuscular or abdominal wall lesions. Review of the MIP images confirms the above findings. IMPRESSION: CT angiogram chest: 1. No demonstrable pulmonary embolus. No thoracic aortic aneurysm or dissection. 2. Sizable right pleural effusion with consolidation and compressive atelectasis throughout much of the right lung. Essentially complete collapse of the  right middle and lower lobes remains. Consolidation medial aspect of the right upper lobe is present. 3. Right paratracheal and right axillary adenopathy, similar to recent study. 4.  Left lower lobe atelectatic change. CT abdomen and pelvis: 1. Areas of lymph node prominence, primarily in the right internal iliac chain, stable and likely of neoplastic etiology given the known lung carcinoma. No new adenopathy. 2. Gallbladder wall is thickened. Known gallstones are better seen on recent CT. There is mild biliary duct dilatation. Correlation with ultrasound to better assess the gallbladder advised given the degree of wall thickening in this area. 3.  Leiomyomatous uterus. 4. No bowel obstruction. No abscess in the abdomen or pelvis. No periappendiceal region inflammation. 5. No renal or ureteral calculi. No hydronephrosis. Urinary bladder wall thickness within normal limits. Electronically Signed   By: Lowella Grip III M.D.   On: 01/01/2019 08:12   Ct Angio Chest Pe W And/or Wo Contrast  Result Date: 12/10/2018 CLINICAL DATA:  Shortness of breath and coughing EXAM: CT ANGIOGRAPHY CHEST WITH CONTRAST TECHNIQUE: Multidetector CT imaging of the chest was performed using the standard protocol during bolus administration of intravenous contrast. Multiplanar CT image reconstructions and MIPs were obtained to evaluate the vascular anatomy. CONTRAST:  191m OMNIPAQUE IOHEXOL 350 MG/ML SOLN COMPARISON:  11/29/2018 FINDINGS: The study is somewhat limited due to patient motion. Cardiovascular: There is a optimal opacification of the pulmonary arteries. There is no central,segmental, or subsegmental filling defects within the pulmonary arteries. There is mild cardiomegaly. No evidence of right ventricular heart strain. There is normal three-vessel brachiocephalic anatomy without proximal stenosis. The thoracic aorta is normal in appearance. Mediastinum/Nodes: There is soft tissue thickening seen at along the right  paratracheal stripe as on the prior exam. There is marked right axillary adenopathy with areas of central necrosis and surrounding fat stranding changes as on the prior exam. Lungs/Pleura: There is interval large min of a moderate to large right pleural effusion. Postradiation fibrotic changes are seen in the medial right upper lung. There is hazy bibasilar atelectasis. Upper Abdomen: No acute abnormalities present in the visualized portions of the upper abdomen. Musculoskeletal: No chest wall abnormality. No acute or significant osseous findings. Review of the MIP images confirms the above findings. IMPRESSION: 1. No pulmonary embolism. 2. Interval enlargement of moderate to large right pleural effusion 3. Post radiation fibrotic changes seen in the medial right upper lung. 4. Right axillary adenopathy with surrounding inflammatory changes and central necrosis. Electronically Signed   By: BPrudencio PairM.D.   On: 12/10/2018 23:16   Nm Hepatobiliary Liver Func  Result Date: 01/01/2019 CLINICAL DATA:  Cholelithiasis, RIGHT upper quadrant pain EXAM: NUCLEAR MEDICINE HEPATOBILIARY IMAGING TECHNIQUE: Sequential images of the abdomen were obtained out to 60 minutes following intravenous administration  of radiopharmaceutical. RADIOPHARMACEUTICALS:  5.5 mCi Tc-35m Choletec IV COMPARISON:  None Correlation: Ultrasound abdomen 01/01/2019, CT abdomen and pelvis 01/01/2019 FINDINGS: Prompt tracer extraction from bloodstream indicating normal hepatocellular function. Prompt excretion of tracer into biliary tree. Small bowel visualized at 11 minutes. At 1 hour gallbladder had not visualized. Patient then received 4 mg of morphine sulfate IV and imaging was continued for 30 minutes. Faint visualization of the gallbladder following morphine administration. Gallbladder was contracted on prior CT and ultrasound exams exam with mild wall thickening. Delayed visualization of the gallbladder may be seen in patients with chronic  cholecystitis. IMPRESSION: Delayed visualization of the gallbladder, seen only following morphine augmentation, raising question of chronic cholecystitis. Gallbladder was contracted with a thickened wall on prior CT and ultrasound exams. Patent CBD. Electronically Signed   By: MLavonia DanaM.D.   On: 01/01/2019 16:18   Ct Abdomen Pelvis W Contrast  Result Date: 01/01/2019 CLINICAL DATA:  Shortness of breath. Stage IV lung carcinoma. Abdominal pain with nausea EXAM: CT ANGIOGRAPHY CHEST CT ABDOMEN AND PELVIS WITH CONTRAST TECHNIQUE: Multidetector CT imaging of the chest was performed using the standard protocol during bolus administration of intravenous contrast. Multiplanar CT image reconstructions and MIPs were obtained to evaluate the vascular anatomy. Multidetector CT imaging of the abdomen and pelvis was performed using the standard protocol during bolus administration of intravenous contrast. CONTRAST:  1075mOMNIPAQUE IOHEXOL 350 MG/ML SOLN COMPARISON:  CT angiogram chest December 10, 2018; CT abdomen and pelvis November 29, 2018; chest radiograph January 01, 2019 FINDINGS: CTA CHEST FINDINGS Cardiovascular: There is no demonstrable pulmonary embolus. There is no thoracic aortic aneurysm or dissection. The visualized great vessels appear unremarkable. There is rather minimal pericardial fluid. There is no pericardial thickening. Port-A-Cath tip is in the superior vena cava. Mediastinum/Nodes: There remains extensive axillary adenopathy on the right. The largest individual lymph node in the right axilla measures 3.4 x 2.7 cm. There is adenopathy in the right paratracheal region with the largest lymph node in this region measuring 2.3 x 2.1 cm. There is soft tissue prominence in the hilum which is inseparable from lung collapse. The appearance is similar to recent study. No esophageal lesions are evident. Lungs/Pleura: Sizable pleural effusion on the right remains with consolidation/compressive atelectasis  throughout the right middle and lower lobes. There is airspace consolidation in the medial aspect of the right upper lobe, stable. On the left, there are areas of relatively mild lower lobe atelectatic change. No consolidation seen on the left. Musculoskeletal: No blastic or lytic bone lesions are evident. No chest wall lesions appreciable. Review of the MIP images confirms the above findings. CT ABDOMEN and PELVIS FINDINGS Hepatobiliary: No focal liver lesions are appreciable. The gallbladder is mildly contracted with gallbladder wall mildly thickened. Gallstones were better seen on recent CT. The proximal common bile duct measures 12 mm which is dilated. The common bile duct tapers distally without mass or calculus evident by radiography. Pancreas: There is no appreciable pancreatic mass or inflammatory focus. Spleen: No splenic lesions are evident. Adrenals/Urinary Tract: Adrenals bilaterally appear normal. Kidneys bilaterally show no evident mass or hydronephrosis on either side. Urinary bladder is midline with wall thickness within normal limits. Stomach/Bowel: There is no appreciable bowel wall or mesenteric thickening. There is no evident bowel obstruction. No free air or portal venous air. The terminal ileum appears unremarkable. Vascular/Lymphatic: There is no abdominal aortic aneurysm. There is aortic atherosclerosis. There are mildly prominent lymph nodes in the pelvis, stable. The largest  lymph node is in the right internal iliac node chain measuring 2.4 x 1.7 cm. There are multiple retroperitoneal lymph nodes, similar to prior study with the largest of these retroperitoneal lymph nodes measuring 0.9 cm in short axis diameter. No new lymph node prominence evident. Reproductive: Uterus is retroverted. Leiomyomatous changes in the uterus are grossly stable compared to the previous study. No extrauterine pelvic mass is evident. Other: Appendix not seen. No periappendiceal region inflammation. No abscess or  ascites evident in the abdomen or pelvis. Musculoskeletal: No blastic or lytic bone lesions. No intramuscular or abdominal wall lesions. Review of the MIP images confirms the above findings. IMPRESSION: CT angiogram chest: 1. No demonstrable pulmonary embolus. No thoracic aortic aneurysm or dissection. 2. Sizable right pleural effusion with consolidation and compressive atelectasis throughout much of the right lung. Essentially complete collapse of the right middle and lower lobes remains. Consolidation medial aspect of the right upper lobe is present. 3. Right paratracheal and right axillary adenopathy, similar to recent study. 4.  Left lower lobe atelectatic change. CT abdomen and pelvis: 1. Areas of lymph node prominence, primarily in the right internal iliac chain, stable and likely of neoplastic etiology given the known lung carcinoma. No new adenopathy. 2. Gallbladder wall is thickened. Known gallstones are better seen on recent CT. There is mild biliary duct dilatation. Correlation with ultrasound to better assess the gallbladder advised given the degree of wall thickening in this area. 3.  Leiomyomatous uterus. 4. No bowel obstruction. No abscess in the abdomen or pelvis. No periappendiceal region inflammation. 5. No renal or ureteral calculi. No hydronephrosis. Urinary bladder wall thickness within normal limits. Electronically Signed   By: Lowella Grip III M.D.   On: 01/01/2019 08:12   Mr Abdomen Mrcp Wo Contrast  Result Date: 01/01/2019 CLINICAL DATA:  Abdominal pain for 3 months. Nausea. Constipation. Shortness of breath. Stage IV lung cancer. Gallstones. EXAM: MRI ABDOMEN WITHOUT CONTRAST  (INCLUDING MRCP) TECHNIQUE: Multiplanar multisequence MR imaging of the abdomen was performed. Heavily T2-weighted images of the biliary and pancreatic ducts were obtained, and three-dimensional MRCP images were rendered by post processing. COMPARISON:  01/01/2019 abdominal ultrasound. Abdominopelvic CT of  01/01/2019. FINDINGS: Mild limitations secondary to patient body habitus. Lower chest: Moderate right pleural effusion.  Normal heart size. Hepatobiliary: Normal noncontrast appearance of the liver. There is borderline to minimal intrahepatic biliary duct dilatation, including on 22/3. The gallbladder is stone filled, with stones up to 1.8 cm. The common duct is dilated, including at maximally 1.5 cm on 83/5. Tapers gradually in the region of the pancreatic head. No obstructive stone or mass identified. Pancreas:  Normal, without mass or ductal dilatation. Spleen:  Normal in size, without focal abnormality. Adrenals/Urinary Tract: Normal adrenal glands. Normal kidneys, without hydronephrosis. Stomach/Bowel:  Normal stomach and abdominal bowel loops. Vascular/Lymphatic:  Normal aortic caliber. Prominent abdominal nodes within the retroperitoneum. None are pathologic by size criteria. Retrocrural adenopathy, including at 1.1 cm on 20/3, suspicious. Other:  No ascites. Musculoskeletal: No acute osseous abnormality. IMPRESSION: 1. Patient body habitus degradation. 2. Cholelithiasis with common duct dilatation. No cause identified. Most likely within normal variation, given today's normal bilirubin level. 3. Retrocrural adenopathy, suspicious for nodal metastasis in this patient with a history of lung cancer. Electronically Signed   By: Abigail Miyamoto M.D.   On: 01/01/2019 20:41   Mr 3d Recon At Scanner  Result Date: 01/01/2019 CLINICAL DATA:  Abdominal pain for 3 months. Nausea. Constipation. Shortness of breath. Stage IV lung cancer.  Gallstones. EXAM: MRI ABDOMEN WITHOUT CONTRAST  (INCLUDING MRCP) TECHNIQUE: Multiplanar multisequence MR imaging of the abdomen was performed. Heavily T2-weighted images of the biliary and pancreatic ducts were obtained, and three-dimensional MRCP images were rendered by post processing. COMPARISON:  01/01/2019 abdominal ultrasound. Abdominopelvic CT of 01/01/2019. FINDINGS: Mild  limitations secondary to patient body habitus. Lower chest: Moderate right pleural effusion.  Normal heart size. Hepatobiliary: Normal noncontrast appearance of the liver. There is borderline to minimal intrahepatic biliary duct dilatation, including on 22/3. The gallbladder is stone filled, with stones up to 1.8 cm. The common duct is dilated, including at maximally 1.5 cm on 83/5. Tapers gradually in the region of the pancreatic head. No obstructive stone or mass identified. Pancreas:  Normal, without mass or ductal dilatation. Spleen:  Normal in size, without focal abnormality. Adrenals/Urinary Tract: Normal adrenal glands. Normal kidneys, without hydronephrosis. Stomach/Bowel:  Normal stomach and abdominal bowel loops. Vascular/Lymphatic:  Normal aortic caliber. Prominent abdominal nodes within the retroperitoneum. None are pathologic by size criteria. Retrocrural adenopathy, including at 1.1 cm on 20/3, suspicious. Other:  No ascites. Musculoskeletal: No acute osseous abnormality. IMPRESSION: 1. Patient body habitus degradation. 2. Cholelithiasis with common duct dilatation. No cause identified. Most likely within normal variation, given today's normal bilirubin level. 3. Retrocrural adenopathy, suspicious for nodal metastasis in this patient with a history of lung cancer. Electronically Signed   By: Abigail Miyamoto M.D.   On: 01/01/2019 20:41   US Abdomen Limited  Result Date: 01/01/2019 CLINICAL DATA:  Abdominal pain EXAM: ULTRASOUND ABDOMEN LIMITED RIGHT UPPER QUADRANT COMPARISON:  CT from the same day. FINDINGS: Gallbladder: There is gallbladder wall thickening. There is no pericholecystic free fluid. The sonographic Percell Miller sign is reported as positive. Gallstones are noted measuring up to approximately 1.1 cm. Evaluation of the gallbladder was limited by patient body habitus. The gallbladder appears somewhat contracted. Common bile duct: Diameter: 1.2 cm Liver: Diffuse increased echogenicity with slightly  heterogeneous liver. Appearance typically secondary to fatty infiltration. Fibrosis secondary consideration. No secondary findings of cirrhosis noted. No focal hepatic lesion or intrahepatic biliary duct dilatation. Portal vein is patent on color Doppler imaging with normal direction of blood flow towards the liver. Other: A right-sided pleural effusion is noted. IMPRESSION: 1. Evaluation limited by patient body habitus and poor sonographic windows. 2. There is cholelithiasis with gallbladder wall thickening and a questionable positive sonographic Murphy sign. Findings are concerning for acute calculus cholecystitis in the appropriate clinical setting. HIDA scan may be useful for confirmation given the gallbladder is not significantly distended. 3. Dilated common bile duct. This raises concern for an obstructing process such as choledocholithiasis. Correlation with laboratory studies is recommended. If there is clinical concern for an obstructing process, follow-up with MRCP/ERCP is recommended. 4. Right-sided pleural effusion. 5. Probable underlying hepatic steatosis. Electronically Signed   By: Constance Holster M.D.   On: 01/01/2019 09:33   Dg Chest Port 1 View  Result Date: 12/12/2018 CLINICAL DATA:  Respiratory failure, metastatic cancer EXAM: PORTABLE CHEST 1 VIEW COMPARISON:  12/11/2018 FINDINGS: No significant change in AP portable examination with layering moderate right pleural and post treatment appearance of the suprahilar right lung. No new airspace opacity. Right chest port catheter. IMPRESSION: No significant change in AP portable examination with layering moderate right pleural and post treatment appearance of the suprahilar right lung. No new airspace opacity. Electronically Signed   By: Eddie Candle M.D.   On: 12/12/2018 12:56   Dg Chest Port 1 View  Result Date:  12/10/2018 CLINICAL DATA:  Shortness of breath. EXAM: PORTABLE CHEST 1 VIEW COMPARISON:  November 16, 2018 FINDINGS: Right  injectable port in stable position. The cardiac silhouette is normal. Clear left lung. Increased right pleural effusion with volume loss in the right hemithorax. Stable right perihilar mass. Osseous structures are without acute abnormality. Soft tissues are grossly normal. IMPRESSION: 1. Increased right pleural effusion with volume loss in the right hemithorax. 2. Stable right perihilar mass. Electronically Signed   By: Fidela Salisbury M.D.   On: 12/10/2018 19:16   US Thoracentesis Asp Pleural Space W/img Guide  Result Date: 01/02/2019 INDICATION: Patient with history of lung cancer with recurrent malignant right pleural effusion; request made for diagnostic and therapeutic right thoracentesis. EXAM: ULTRASOUND GUIDED DIAGNOSTIC AND THERAPEUTIC RIGHT THORACENTESIS MEDICATIONS: None COMPLICATIONS: None immediate. PROCEDURE: An ultrasound guided thoracentesis was thoroughly discussed with the patient and questions answered. The benefits, risks, alternatives and complications were also discussed. The patient understands and wishes to proceed with the procedure. Written consent was obtained. Ultrasound was performed to localize and mark an adequate pocket of fluid in the right chest. The area was then prepped and draped in the normal sterile fashion. 1% Lidocaine was used for local anesthesia. Under ultrasound guidance a 6 Fr Safe-T-Centesis catheter was introduced. Thoracentesis was performed. The catheter was removed and a dressing applied. FINDINGS: A total of approximately 920 cc of yellow fluid was removed. Samples were sent to the laboratory as requested by the clinical team. IMPRESSION: Successful ultrasound guided diagnostic and therapeutic right thoracentesis yielding 920 cc of pleural fluid. Read by: Rowe Robert, PA-C Electronically Signed   By: Jerilynn Mages.  Shick M.D.   On: 01/02/2019 12:02   US Thoracentesis Asp Pleural Space W/img Guide  Result Date: 12/11/2018 INDICATION: Symptomatic right sided pleural  effusion EXAM: US THORACENTESIS ASP PLEURAL SPACE W/IMG GUIDE COMPARISON:  None. MEDICATIONS: 10 cc 1% lidocaine COMPLICATIONS: None immediate. TECHNIQUE: Informed written consent was obtained from the patient after a discussion of the risks, benefits and alternatives to treatment. A timeout was performed prior to the initiation of the procedure. Initial ultrasound scanning demonstrates a right pleural effusion. The lower chest was prepped and draped in the usual sterile fashion. 1% lidocaine was used for local anesthesia. Under direct ultrasound guidance, a 19 gauge, 7-cm, Yueh catheter was introduced. An ultrasound image was saved for documentation purposes. The thoracentesis was performed. The catheter was removed and a dressing was applied. The patient tolerated the procedure well without immediate post procedural complication. The patient was escorted to have an upright chest radiograph. FINDINGS: A total of approximately 400 cc of yellow fluid was removed. Requested samples were sent to the laboratory. IMPRESSION: Successful ultrasound-guided right sided thoracentesis yielding 400 cc of pleural fluid. Read by Lavonia Drafts [facility] Electronically Signed   By: Sandi Mariscal M.D.   On: 12/11/2018 11:09     Dg Chest 1 View  Result Date: 01/02/2019 CLINICAL DATA:  Post thoracentesis EXAM: CHEST  1 VIEW COMPARISON:  01/01/2019 FINDINGS: Status post right thoracentesis with decreasing right effusion. No pneumothorax. Medial right upper lobe suprahilar mass again noted, unchanged. Right Port-A-Cath is unchanged. Left lung clear. Heart is normal size. IMPRESSION: Decreasing right effusion following thoracentesis. No pneumothorax. Otherwise no change. Electronically Signed   By: Rolm Baptise M.D.   On: 01/02/2019 12:16   Dg Chest 1 View  Result Date: 12/11/2018 CLINICAL DATA:  Post right-sided thoracentesis. EXAM: CHEST  1 VIEW COMPARISON:  Chest radiograph-12/10/2018; chest CT-12/10/2018 FINDINGS:  Grossly  unchanged cardiac silhouette and mediastinal contours with obscuration of the right hilum secondary to consolidative opacities, volume loss and architectural distortion as demonstrated on preceding chest CT. No change to slight reduction of right-sided pleural effusion post thoracentesis. No pneumothorax. The left hemithorax remains well aerated. No new focal airspace opacities. No evidence of edema. Stable position of support apparatus. No acute osseous abnormalities. IMPRESSION: 1. No change to slight reduction of right-sided pleural effusion post thoracentesis. No pneumothorax. 2. Persistent obscuration of the right hilum secondary to consolidative opacities, volume loss and architectural distortion. Electronically Signed   By: Sandi Mariscal M.D.   On: 12/11/2018 11:33   Dg Chest 2 View  Result Date: 01/01/2019 CLINICAL DATA:  Shortness of breath EXAM: CHEST - 2 VIEW COMPARISON:  Twenty days ago FINDINGS: Right perihilar opacity (likely radiation fibrosis by CT) with volume loss and small to moderate pleural effusion. Pleural fluid has increased from prior. The left lung remains clear. Normal heart size. Port on the right with tip at the SVC. IMPRESSION: Small to moderate right pleural effusion with increase from 3 weeks ago. Electronically Signed   By: Monte Fantasia M.D.   On: 01/01/2019 05:29   Ct Angio Chest Pe W And/or Wo Contrast  Result Date: 01/01/2019 CLINICAL DATA:  Shortness of breath. Stage IV lung carcinoma. Abdominal pain with nausea EXAM: CT ANGIOGRAPHY CHEST CT ABDOMEN AND PELVIS WITH CONTRAST TECHNIQUE: Multidetector CT imaging of the chest was performed using the standard protocol during bolus administration of intravenous contrast. Multiplanar CT image reconstructions and MIPs were obtained to evaluate the vascular anatomy. Multidetector CT imaging of the abdomen and pelvis was performed using the standard protocol during bolus administration of intravenous contrast. CONTRAST:  172m  OMNIPAQUE IOHEXOL 350 MG/ML SOLN COMPARISON:  CT angiogram chest December 10, 2018; CT abdomen and pelvis November 29, 2018; chest radiograph January 01, 2019 FINDINGS: CTA CHEST FINDINGS Cardiovascular: There is no demonstrable pulmonary embolus. There is no thoracic aortic aneurysm or dissection. The visualized great vessels appear unremarkable. There is rather minimal pericardial fluid. There is no pericardial thickening. Port-A-Cath tip is in the superior vena cava. Mediastinum/Nodes: There remains extensive axillary adenopathy on the right. The largest individual lymph node in the right axilla measures 3.4 x 2.7 cm. There is adenopathy in the right paratracheal region with the largest lymph node in this region measuring 2.3 x 2.1 cm. There is soft tissue prominence in the hilum which is inseparable from lung collapse. The appearance is similar to recent study. No esophageal lesions are evident. Lungs/Pleura: Sizable pleural effusion on the right remains with consolidation/compressive atelectasis throughout the right middle and lower lobes. There is airspace consolidation in the medial aspect of the right upper lobe, stable. On the left, there are areas of relatively mild lower lobe atelectatic change. No consolidation seen on the left. Musculoskeletal: No blastic or lytic bone lesions are evident. No chest wall lesions appreciable. Review of the MIP images confirms the above findings. CT ABDOMEN and PELVIS FINDINGS Hepatobiliary: No focal liver lesions are appreciable. The gallbladder is mildly contracted with gallbladder wall mildly thickened. Gallstones were better seen on recent CT. The proximal common bile duct measures 12 mm which is dilated. The common bile duct tapers distally without mass or calculus evident by radiography. Pancreas: There is no appreciable pancreatic mass or inflammatory focus. Spleen: No splenic lesions are evident. Adrenals/Urinary Tract: Adrenals bilaterally appear normal. Kidneys  bilaterally show no evident mass or hydronephrosis on either side. Urinary  bladder is midline with wall thickness within normal limits. Stomach/Bowel: There is no appreciable bowel wall or mesenteric thickening. There is no evident bowel obstruction. No free air or portal venous air. The terminal ileum appears unremarkable. Vascular/Lymphatic: There is no abdominal aortic aneurysm. There is aortic atherosclerosis. There are mildly prominent lymph nodes in the pelvis, stable. The largest lymph node is in the right internal iliac node chain measuring 2.4 x 1.7 cm. There are multiple retroperitoneal lymph nodes, similar to prior study with the largest of these retroperitoneal lymph nodes measuring 0.9 cm in short axis diameter. No new lymph node prominence evident. Reproductive: Uterus is retroverted. Leiomyomatous changes in the uterus are grossly stable compared to the previous study. No extrauterine pelvic mass is evident. Other: Appendix not seen. No periappendiceal region inflammation. No abscess or ascites evident in the abdomen or pelvis. Musculoskeletal: No blastic or lytic bone lesions. No intramuscular or abdominal wall lesions. Review of the MIP images confirms the above findings. IMPRESSION: CT angiogram chest: 1. No demonstrable pulmonary embolus. No thoracic aortic aneurysm or dissection. 2. Sizable right pleural effusion with consolidation and compressive atelectasis throughout much of the right lung. Essentially complete collapse of the right middle and lower lobes remains. Consolidation medial aspect of the right upper lobe is present. 3. Right paratracheal and right axillary adenopathy, similar to recent study. 4.  Left lower lobe atelectatic change. CT abdomen and pelvis: 1. Areas of lymph node prominence, primarily in the right internal iliac chain, stable and likely of neoplastic etiology given the known lung carcinoma. No new adenopathy. 2. Gallbladder wall is thickened. Known gallstones are  better seen on recent CT. There is mild biliary duct dilatation. Correlation with ultrasound to better assess the gallbladder advised given the degree of wall thickening in this area. 3.  Leiomyomatous uterus. 4. No bowel obstruction. No abscess in the abdomen or pelvis. No periappendiceal region inflammation. 5. No renal or ureteral calculi. No hydronephrosis. Urinary bladder wall thickness within normal limits. Electronically Signed   By: Lowella Grip III M.D.   On: 01/01/2019 08:12   Ct Angio Chest Pe W And/or Wo Contrast  Result Date: 12/10/2018 CLINICAL DATA:  Shortness of breath and coughing EXAM: CT ANGIOGRAPHY CHEST WITH CONTRAST TECHNIQUE: Multidetector CT imaging of the chest was performed using the standard protocol during bolus administration of intravenous contrast. Multiplanar CT image reconstructions and MIPs were obtained to evaluate the vascular anatomy. CONTRAST:  17m OMNIPAQUE IOHEXOL 350 MG/ML SOLN COMPARISON:  11/29/2018 FINDINGS: The study is somewhat limited due to patient motion. Cardiovascular: There is a optimal opacification of the pulmonary arteries. There is no central,segmental, or subsegmental filling defects within the pulmonary arteries. There is mild cardiomegaly. No evidence of right ventricular heart strain. There is normal three-vessel brachiocephalic anatomy without proximal stenosis. The thoracic aorta is normal in appearance. Mediastinum/Nodes: There is soft tissue thickening seen at along the right paratracheal stripe as on the prior exam. There is marked right axillary adenopathy with areas of central necrosis and surrounding fat stranding changes as on the prior exam. Lungs/Pleura: There is interval large min of a moderate to large right pleural effusion. Postradiation fibrotic changes are seen in the medial right upper lung. There is hazy bibasilar atelectasis. Upper Abdomen: No acute abnormalities present in the visualized portions of the upper abdomen.  Musculoskeletal: No chest wall abnormality. No acute or significant osseous findings. Review of the MIP images confirms the above findings. IMPRESSION: 1. No pulmonary embolism. 2. Interval enlargement of  moderate to large right pleural effusion 3. Post radiation fibrotic changes seen in the medial right upper lung. 4. Right axillary adenopathy with surrounding inflammatory changes and central necrosis. Electronically Signed   By: Prudencio Pair M.D.   On: 12/10/2018 23:16   Nm Hepatobiliary Liver Func  Result Date: 01/01/2019 CLINICAL DATA:  Cholelithiasis, RIGHT upper quadrant pain EXAM: NUCLEAR MEDICINE HEPATOBILIARY IMAGING TECHNIQUE: Sequential images of the abdomen were obtained out to 60 minutes following intravenous administration of radiopharmaceutical. RADIOPHARMACEUTICALS:  5.5 mCi Tc-32m Choletec IV COMPARISON:  None Correlation: Ultrasound abdomen 01/01/2019, CT abdomen and pelvis 01/01/2019 FINDINGS: Prompt tracer extraction from bloodstream indicating normal hepatocellular function. Prompt excretion of tracer into biliary tree. Small bowel visualized at 11 minutes. At 1 hour gallbladder had not visualized. Patient then received 4 mg of morphine sulfate IV and imaging was continued for 30 minutes. Faint visualization of the gallbladder following morphine administration. Gallbladder was contracted on prior CT and ultrasound exams exam with mild wall thickening. Delayed visualization of the gallbladder may be seen in patients with chronic cholecystitis. IMPRESSION: Delayed visualization of the gallbladder, seen only following morphine augmentation, raising question of chronic cholecystitis. Gallbladder was contracted with a thickened wall on prior CT and ultrasound exams. Patent CBD. Electronically Signed   By: MLavonia DanaM.D.   On: 01/01/2019 16:18   Ct Abdomen Pelvis W Contrast  Result Date: 01/01/2019 CLINICAL DATA:  Shortness of breath. Stage IV lung carcinoma. Abdominal pain with nausea  EXAM: CT ANGIOGRAPHY CHEST CT ABDOMEN AND PELVIS WITH CONTRAST TECHNIQUE: Multidetector CT imaging of the chest was performed using the standard protocol during bolus administration of intravenous contrast. Multiplanar CT image reconstructions and MIPs were obtained to evaluate the vascular anatomy. Multidetector CT imaging of the abdomen and pelvis was performed using the standard protocol during bolus administration of intravenous contrast. CONTRAST:  1053mOMNIPAQUE IOHEXOL 350 MG/ML SOLN COMPARISON:  CT angiogram chest December 10, 2018; CT abdomen and pelvis November 29, 2018; chest radiograph January 01, 2019 FINDINGS: CTA CHEST FINDINGS Cardiovascular: There is no demonstrable pulmonary embolus. There is no thoracic aortic aneurysm or dissection. The visualized great vessels appear unremarkable. There is rather minimal pericardial fluid. There is no pericardial thickening. Port-A-Cath tip is in the superior vena cava. Mediastinum/Nodes: There remains extensive axillary adenopathy on the right. The largest individual lymph node in the right axilla measures 3.4 x 2.7 cm. There is adenopathy in the right paratracheal region with the largest lymph node in this region measuring 2.3 x 2.1 cm. There is soft tissue prominence in the hilum which is inseparable from lung collapse. The appearance is similar to recent study. No esophageal lesions are evident. Lungs/Pleura: Sizable pleural effusion on the right remains with consolidation/compressive atelectasis throughout the right middle and lower lobes. There is airspace consolidation in the medial aspect of the right upper lobe, stable. On the left, there are areas of relatively mild lower lobe atelectatic change. No consolidation seen on the left. Musculoskeletal: No blastic or lytic bone lesions are evident. No chest wall lesions appreciable. Review of the MIP images confirms the above findings. CT ABDOMEN and PELVIS FINDINGS Hepatobiliary: No focal liver lesions are  appreciable. The gallbladder is mildly contracted with gallbladder wall mildly thickened. Gallstones were better seen on recent CT. The proximal common bile duct measures 12 mm which is dilated. The common bile duct tapers distally without mass or calculus evident by radiography. Pancreas: There is no appreciable pancreatic mass or inflammatory focus. Spleen: No splenic  lesions are evident. Adrenals/Urinary Tract: Adrenals bilaterally appear normal. Kidneys bilaterally show no evident mass or hydronephrosis on either side. Urinary bladder is midline with wall thickness within normal limits. Stomach/Bowel: There is no appreciable bowel wall or mesenteric thickening. There is no evident bowel obstruction. No free air or portal venous air. The terminal ileum appears unremarkable. Vascular/Lymphatic: There is no abdominal aortic aneurysm. There is aortic atherosclerosis. There are mildly prominent lymph nodes in the pelvis, stable. The largest lymph node is in the right internal iliac node chain measuring 2.4 x 1.7 cm. There are multiple retroperitoneal lymph nodes, similar to prior study with the largest of these retroperitoneal lymph nodes measuring 0.9 cm in short axis diameter. No new lymph node prominence evident. Reproductive: Uterus is retroverted. Leiomyomatous changes in the uterus are grossly stable compared to the previous study. No extrauterine pelvic mass is evident. Other: Appendix not seen. No periappendiceal region inflammation. No abscess or ascites evident in the abdomen or pelvis. Musculoskeletal: No blastic or lytic bone lesions. No intramuscular or abdominal wall lesions. Review of the MIP images confirms the above findings. IMPRESSION: CT angiogram chest: 1. No demonstrable pulmonary embolus. No thoracic aortic aneurysm or dissection. 2. Sizable right pleural effusion with consolidation and compressive atelectasis throughout much of the right lung. Essentially complete collapse of the right middle  and lower lobes remains. Consolidation medial aspect of the right upper lobe is present. 3. Right paratracheal and right axillary adenopathy, similar to recent study. 4.  Left lower lobe atelectatic change. CT abdomen and pelvis: 1. Areas of lymph node prominence, primarily in the right internal iliac chain, stable and likely of neoplastic etiology given the known lung carcinoma. No new adenopathy. 2. Gallbladder wall is thickened. Known gallstones are better seen on recent CT. There is mild biliary duct dilatation. Correlation with ultrasound to better assess the gallbladder advised given the degree of wall thickening in this area. 3.  Leiomyomatous uterus. 4. No bowel obstruction. No abscess in the abdomen or pelvis. No periappendiceal region inflammation. 5. No renal or ureteral calculi. No hydronephrosis. Urinary bladder wall thickness within normal limits. Electronically Signed   By: Lowella Grip III M.D.   On: 01/01/2019 08:12   Mr Abdomen Mrcp Wo Contrast  Result Date: 01/01/2019 CLINICAL DATA:  Abdominal pain for 3 months. Nausea. Constipation. Shortness of breath. Stage IV lung cancer. Gallstones. EXAM: MRI ABDOMEN WITHOUT CONTRAST  (INCLUDING MRCP) TECHNIQUE: Multiplanar multisequence MR imaging of the abdomen was performed. Heavily T2-weighted images of the biliary and pancreatic ducts were obtained, and three-dimensional MRCP images were rendered by post processing. COMPARISON:  01/01/2019 abdominal ultrasound. Abdominopelvic CT of 01/01/2019. FINDINGS: Mild limitations secondary to patient body habitus. Lower chest: Moderate right pleural effusion.  Normal heart size. Hepatobiliary: Normal noncontrast appearance of the liver. There is borderline to minimal intrahepatic biliary duct dilatation, including on 22/3. The gallbladder is stone filled, with stones up to 1.8 cm. The common duct is dilated, including at maximally 1.5 cm on 83/5. Tapers gradually in the region of the pancreatic head. No  obstructive stone or mass identified. Pancreas:  Normal, without mass or ductal dilatation. Spleen:  Normal in size, without focal abnormality. Adrenals/Urinary Tract: Normal adrenal glands. Normal kidneys, without hydronephrosis. Stomach/Bowel:  Normal stomach and abdominal bowel loops. Vascular/Lymphatic:  Normal aortic caliber. Prominent abdominal nodes within the retroperitoneum. None are pathologic by size criteria. Retrocrural adenopathy, including at 1.1 cm on 20/3, suspicious. Other:  No ascites. Musculoskeletal: No acute osseous abnormality. IMPRESSION: 1.  Patient body habitus degradation. 2. Cholelithiasis with common duct dilatation. No cause identified. Most likely within normal variation, given today's normal bilirubin level. 3. Retrocrural adenopathy, suspicious for nodal metastasis in this patient with a history of lung cancer. Electronically Signed   By: Abigail Miyamoto M.D.   On: 01/01/2019 20:41   Mr 3d Recon At Scanner  Result Date: 01/01/2019 CLINICAL DATA:  Abdominal pain for 3 months. Nausea. Constipation. Shortness of breath. Stage IV lung cancer. Gallstones. EXAM: MRI ABDOMEN WITHOUT CONTRAST  (INCLUDING MRCP) TECHNIQUE: Multiplanar multisequence MR imaging of the abdomen was performed. Heavily T2-weighted images of the biliary and pancreatic ducts were obtained, and three-dimensional MRCP images were rendered by post processing. COMPARISON:  01/01/2019 abdominal ultrasound. Abdominopelvic CT of 01/01/2019. FINDINGS: Mild limitations secondary to patient body habitus. Lower chest: Moderate right pleural effusion.  Normal heart size. Hepatobiliary: Normal noncontrast appearance of the liver. There is borderline to minimal intrahepatic biliary duct dilatation, including on 22/3. The gallbladder is stone filled, with stones up to 1.8 cm. The common duct is dilated, including at maximally 1.5 cm on 83/5. Tapers gradually in the region of the pancreatic head. No obstructive stone or mass  identified. Pancreas:  Normal, without mass or ductal dilatation. Spleen:  Normal in size, without focal abnormality. Adrenals/Urinary Tract: Normal adrenal glands. Normal kidneys, without hydronephrosis. Stomach/Bowel:  Normal stomach and abdominal bowel loops. Vascular/Lymphatic:  Normal aortic caliber. Prominent abdominal nodes within the retroperitoneum. None are pathologic by size criteria. Retrocrural adenopathy, including at 1.1 cm on 20/3, suspicious. Other:  No ascites. Musculoskeletal: No acute osseous abnormality. IMPRESSION: 1. Patient body habitus degradation. 2. Cholelithiasis with common duct dilatation. No cause identified. Most likely within normal variation, given today's normal bilirubin level. 3. Retrocrural adenopathy, suspicious for nodal metastasis in this patient with a history of lung cancer. Electronically Signed   By: Abigail Miyamoto M.D.   On: 01/01/2019 20:41   US Abdomen Limited  Result Date: 01/01/2019 CLINICAL DATA:  Abdominal pain EXAM: ULTRASOUND ABDOMEN LIMITED RIGHT UPPER QUADRANT COMPARISON:  CT from the same day. FINDINGS: Gallbladder: There is gallbladder wall thickening. There is no pericholecystic free fluid. The sonographic Percell Miller sign is reported as positive. Gallstones are noted measuring up to approximately 1.1 cm. Evaluation of the gallbladder was limited by patient body habitus. The gallbladder appears somewhat contracted. Common bile duct: Diameter: 1.2 cm Liver: Diffuse increased echogenicity with slightly heterogeneous liver. Appearance typically secondary to fatty infiltration. Fibrosis secondary consideration. No secondary findings of cirrhosis noted. No focal hepatic lesion or intrahepatic biliary duct dilatation. Portal vein is patent on color Doppler imaging with normal direction of blood flow towards the liver. Other: A right-sided pleural effusion is noted. IMPRESSION: 1. Evaluation limited by patient body habitus and poor sonographic windows. 2. There is  cholelithiasis with gallbladder wall thickening and a questionable positive sonographic Murphy sign. Findings are concerning for acute calculus cholecystitis in the appropriate clinical setting. HIDA scan may be useful for confirmation given the gallbladder is not significantly distended. 3. Dilated common bile duct. This raises concern for an obstructing process such as choledocholithiasis. Correlation with laboratory studies is recommended. If there is clinical concern for an obstructing process, follow-up with MRCP/ERCP is recommended. 4. Right-sided pleural effusion. 5. Probable underlying hepatic steatosis. Electronically Signed   By: Constance Holster M.D.   On: 01/01/2019 09:33   Dg Chest Port 1 View  Result Date: 12/12/2018 CLINICAL DATA:  Respiratory failure, metastatic cancer EXAM: PORTABLE CHEST 1 VIEW COMPARISON:  12/11/2018 FINDINGS: No significant change in AP portable examination with layering moderate right pleural and post treatment appearance of the suprahilar right lung. No new airspace opacity. Right chest port catheter. IMPRESSION: No significant change in AP portable examination with layering moderate right pleural and post treatment appearance of the suprahilar right lung. No new airspace opacity. Electronically Signed   By: Eddie Candle M.D.   On: 12/12/2018 12:56   Dg Chest Port 1 View  Result Date: 12/10/2018 CLINICAL DATA:  Shortness of breath. EXAM: PORTABLE CHEST 1 VIEW COMPARISON:  November 16, 2018 FINDINGS: Right injectable port in stable position. The cardiac silhouette is normal. Clear left lung. Increased right pleural effusion with volume loss in the right hemithorax. Stable right perihilar mass. Osseous structures are without acute abnormality. Soft tissues are grossly normal. IMPRESSION: 1. Increased right pleural effusion with volume loss in the right hemithorax. 2. Stable right perihilar mass. Electronically Signed   By: Fidela Salisbury M.D.   On: 12/10/2018 19:16    US Thoracentesis Asp Pleural Space W/img Guide  Result Date: 01/02/2019 INDICATION: Patient with history of lung cancer with recurrent malignant right pleural effusion; request made for diagnostic and therapeutic right thoracentesis. EXAM: ULTRASOUND GUIDED DIAGNOSTIC AND THERAPEUTIC RIGHT THORACENTESIS MEDICATIONS: None COMPLICATIONS: None immediate. PROCEDURE: An ultrasound guided thoracentesis was thoroughly discussed with the patient and questions answered. The benefits, risks, alternatives and complications were also discussed. The patient understands and wishes to proceed with the procedure. Written consent was obtained. Ultrasound was performed to localize and mark an adequate pocket of fluid in the right chest. The area was then prepped and draped in the normal sterile fashion. 1% Lidocaine was used for local anesthesia. Under ultrasound guidance a 6 Fr Safe-T-Centesis catheter was introduced. Thoracentesis was performed. The catheter was removed and a dressing applied. FINDINGS: A total of approximately 920 cc of yellow fluid was removed. Samples were sent to the laboratory as requested by the clinical team. IMPRESSION: Successful ultrasound guided diagnostic and therapeutic right thoracentesis yielding 920 cc of pleural fluid. Read by: Rowe Robert, PA-C Electronically Signed   By: Jerilynn Mages.  Shick M.D.   On: 01/02/2019 12:02   US Thoracentesis Asp Pleural Space W/img Guide  Result Date: 12/11/2018 INDICATION: Symptomatic right sided pleural effusion EXAM: US THORACENTESIS ASP PLEURAL SPACE W/IMG GUIDE COMPARISON:  None. MEDICATIONS: 10 cc 1% lidocaine COMPLICATIONS: None immediate. TECHNIQUE: Informed written consent was obtained from the patient after a discussion of the risks, benefits and alternatives to treatment. A timeout was performed prior to the initiation of the procedure. Initial ultrasound scanning demonstrates a right pleural effusion. The lower chest was prepped and draped in the usual  sterile fashion. 1% lidocaine was used for local anesthesia. Under direct ultrasound guidance, a 19 gauge, 7-cm, Yueh catheter was introduced. An ultrasound image was saved for documentation purposes. The thoracentesis was performed. The catheter was removed and a dressing was applied. The patient tolerated the procedure well without immediate post procedural complication. The patient was escorted to have an upright chest radiograph. FINDINGS: A total of approximately 400 cc of yellow fluid was removed. Requested samples were sent to the laboratory. IMPRESSION: Successful ultrasound-guided right sided thoracentesis yielding 400 cc of pleural fluid. Read by Lavonia Drafts [facility] Electronically Signed   By: Sandi Mariscal M.D.   On: 12/11/2018 11:09   Assessment and Plan:  53 y.o.  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  -Noted to have a small mass in the left adrenal on CT but PET/CT neg for metastatic disease. Patient has been a lifelong nonsmoker -MRI of the brain was negative for any metastatic disease. At diagnosis. -High PDL1 expression (90%) on foundation One Neg for EGFR, ALK, ROS-1 and BRAF mutations. -Patient completed her planned definitive chemoradiation with Carbo Taxol on 02/13/2016. No prohibitive toxicities other than some grade 1 skin desquamation and some grade 1-2 radiation esophagitis. She has subsequently received 2 out of 2 planned dose of carboplatin + Taxol. -CTA chest 10/29/2016  no evidence of lung cancer progression in the chest. No PE -CT abd/pelvis-10/29/2016  Interval 5.0 x 5.0 x 4.7 cm mass in the right lateral subcutaneous fat at the level of the mid abdomen.  This has CT features compatible with a metastasis or primary neoplasm. -CT C/A/P (05/12/2017): Interval development of right axillary, right external iliac and right inguinal adenopathy. Suspicious for nodal metastasis. 2. Decrease in size of right paratracheal adenopathy. 3. Decrease in size and enhancement associated with right lateral body wall lesion. 4. New small nonspecific pulmonary nodule in the left lower lobe measuring 6 mm. 5. Stable appearance of changes secondary to external beam radiation within the right lung  08/03/17 PET which revealed 2.8 cm right axillary lymph node is markedly hypermetabolic and consistent with metastatic disease. Extensive radiation changes involving the right paramediastinal lung. There is a focus of hypermetabolism in the right lower lobe which is suspicious for residual or recurrent tumor.  11/11/17 CT C/A/P revealed There has been interval increase in number of enlarged of right axillary nodes and increase in size of right inguinal adenopathy compatible with progression of disease. Stable appearance of the right lung with changes of external beam radiation.   03/01/18 CT C/A/P revealed Bulky right subpectoral/axillary adenopathy, markedly progressive from 11/11/2017. Enlarging retrocrural lymph node. Low right paratracheal adenopathy is stable. 2. Interval resection of a right inguinal nodal mass. Stable right external iliac lymph node. 3. Cholelithiasis. Stable common bile duct dilatation. 4. Aortic atherosclerosis.  she received maintenance Durvalumab from  05/24/2016 q2weeks, switched to Nivolumab q2weeks on 12/13/16 in the setting of Biopsy proven isolated metastatic disease. Given the tumor is strongly PDL1 positive and patient has only isolated metastatic disease was treated with palliative RT to metastases with significant improvement. Molecular pathology from 515/19 did not reveal obviously targetable mutations with NF1 and TP53 mutations present.  05/24/18  CT Chest revealed Mixed interval change. 2. Moderate dependent right pleural effusion, significantly increased. 3. Right axillary adenopathy is decreased. 4. New left supraclavicular and left posterior mediastinal lymphadenopathy. Increased right retrocrural adenopathy. These findings are worrisome for progression of metastatic nodal disease. 5. New mild patchy nodular consolidation in the peripheral right middle lobe, which could be inflammatory or neoplastic. 6. Similar finely nodular patchy thickening of the peripheral peribronchovascular interstitium and interlobular septa in the lower lungs bilaterally, nonspecific, lymphangitic tumor not excluded. 7. Nonspecific increased right breast skin thickening, possibly treatment related. Aortic Atherosclerosis.  05/30/18 CT Abdomen revealed Cholelithiasis, without associated inflammatory changes to suggest acute cholecystitis. Moderate right pleural effusion. Please note that the pelvis was not imaged.  07/21/18 CT Chest revealed "Progressive left supraclavicular lymphadenopathy. Right axillary, mediastinal and hilar adenopathy is grossly stable, suboptimally evaluated without contrast. These findings remain  consistent with metastatic disease. 2. Slight enlargement of moderate-sized right pleural effusion with extension into the minor fissure. This limits evaluation of the previously demonstrated right middle lobe nodular density. This effusion could be malignant. No discretely enlarging pulmonary nodules identified. 3. Stable skin thickening in the right breast. 4. PET CT follow-up may be helpful to assess for hypermetabolism in the lymph nodes and right pleural space."  Pt was previously seen to have progression through immunotherapy treatment and have been holding Nivolumab  09/14/18 CT Chest revealed "Persistent right axillary, mediastinal, and left supraclavicular adenopathy. Stable to mild increase in size of these lymph nodes period. 2. Stable to mild  decrease in size of moderate size right pleural effusion. 3. Stable skin thickening of the right breast." S/p 09/15/18 Thoracentesis removed 1L of fluid 09/15/18 Cytology revealed malignant cells in the removed pleural fluid  11/16/2018 Pleural Fluid Biopsy ([id number]) with results revealing "malignant cells consistent with metastatic carcinoma"  #2 normocytic anemia related to chemotherapy  #3  Acute cholecystitis  -The patient has had a recurrent malignant pleural effusion.  Status post thoracentesis on 4 occasions.  I have discussed with the patient consideration of a Pleurx catheter if she continues to have rapid recurrent pleural effusions and is requiring a thoracentesis on a regular basis.  The patient is hesitant to consider a Pleurx catheter.  Will consider referral to cardiothoracic surgery if she does not wish to pursue a Pleurx for consideration of pleurodesis. -Hemoglobin is 6.9 today.  She denies any active bleeding.  Anemia likely related to recent chemotherapy.  Recommend PRBC transfusion to keep hemoglobin above 8. -The patient has developed acute cholecystitis.  Her case is challenging because she received Alimta and Avastin on 12/27/2018.  While the Alimta would not preclude her from proceeding to surgery if needed, Avastin can interfere with wound healing and increase the risk for bleeding.  Recommendations are to avoid surgery for at least 4 weeks following the last dose of Avastin.  For now, recommend continued medical management with IV antibiotics, IV fluids, and pain medication.  If she is very symptomatic from her cholecystitis, we could ask IR to consider placement of drainage tube.  If the patient becomes septic from her cholecystitis, would need to proceed with emergent surgery.  If we can control her symptoms with medical management and possibly drainage tube placement, we can arrange for outpatient follow-up with general surgery for cholecystectomy.  Thank you for this  referral.   Mikey Bussing, DNP, AGPCNP-BC, AOCNP  ADDENDUM  .Patient was Personally and independently interviewed, examined and relevant elements of the history of present illness were reviewed in details and an assessment and plan was created. All elements of the patient's history of present illness , assessment and plan were discussed in details with Mikey Bussing, DNP. The above documentation reflects our combined findings assessment and plan.  Sullivan Lone MD MS

## 2019-01-02 NOTE — Progress Notes (Signed)
PROGRESS NOTE    Marilyn [name]  ZOX:[id number] DOB: 1965-04-27 DOA: 01/01/2019 PCP: [name], FNP    Brief Narrative:  53 y.o. female with Past medical history of metastatic adenocarcinoma, nonischemic cardiomyopathy, HTN, hypothyroidism, malignant pleural effusion S/P thoracentesis x3, hypothyroidism. Patient presents with complaints of abdominal pain.  She reports that this pain has been ongoing for 3 months but in last 3 days this is worsened. She also reports nausea and constipation.  Passing gas.  No vomiting.  She denies any fever or chills.  She denies any active bleeding anywhere. She denies any trauma or injury. Patient is a chemotherapy patient.  Last infusion was on 12/27/2018 Avastin and Alimta along with carboplatin. Patient continues to have shortness of breath as well as chronic cough.  No blood in the sputum.  ED Course: Presents with abdominal pain.  Ultrasound abdomen shows evidence of cholecystitis.  CT scan also shows evidence of gallbladder wall thickening but no other acute abnormality.  Patient was referred for admission due to recurrent right pleural effusion for thoracentesis as well as pain control of abdomen.  At her baseline ambulates without assistance Is independent for most of her ADL;  Does manages her medication on her own.  Assessment & Plan:   Active Problems:   Diabetes mellitus without complication (HCC)   Adenocarcinoma of left lung, stage 3 (HCC)   Port catheter in place   Sinus tachycardia   Hypothyroidism   Non-ischemic cardiomyopathy ([place])   Essential hypertension   HLD (hyperlipidemia)   Depression   Chronic diastolic CHF (congestive heart failure) (HCC)   Recurrent right pleural effusion  1.  Acute on chronic abdominal pain -CT abdomen and pelvis is positive for thick gallbladder, gallstones, CBD dilation and leiomyomatous uterus but no evidence of acute infection or inflammation. -Ultrasound abdomen also showed  cholelithiasis.  Dilated CBD and gallbladder wall thickening concerning for acute calculus cholecystitis. -LFT's normal -HIDA scan performed shows evidence of chronic cholecystitis without any evidence of acute component. -Dr. Posey Pronto had discussed with Dr. Samule Ohm surgery on-call who recommended that the patient will require her gallbladder removed based on the results of the HIDA scan but given her history of active chemotherapy they would like to hold off on that and recommend outpatient follow-up in the clinic. -Pt is continued on empiric zosyn -Will repeat CMP and CBC in AM  2.  Malignant recurrent right pleural effusion -Patient has metastatic adenocarcinoma of the lung and has recurrent right-sided pleural effusion. -CT PE protocol in the ER is concerning for reaccumulation of the fluid -CT chest is negative for PE. -Pt reportedly does not wish to undergo Pleurx catheter placement as she is worried about infection. -Pt is s/p thoracentesis 9/29, yielding 920cc fluid -Cont to wean O2 as tolerated  3.  Metastatic non-small cell lung cancer. -Followed by Dr. Irene Limbo -Currently on chemotherapy with carboplatin, Alimta and Avastin.  Last cycle on 12/27/2018 -Oncology following  4.  Depression. -Continue on cymbalta as tolerated -Seems stable at present  5.  Abdominal pain. -CT personally reviewed. Findings of stool burden in the ascending colon and proximal transverse colon -Stool softeners ordered at presentation -Still no BM today and pt symptomatic -Will give trial of lactulose PO with duolcolax suppository -If still no result, consider Mg citrate or GoLytely bowel prep  6.  Chronic diastolic dysfunction. -Prior history of nonischemic cardiomyopathy showed normal EF -Seems to be stable at this time  7. Type 2 Diabetes Mellitus, poorly controled without complication -  last hemoglobin A1c was 10. -Continue SSI coverage as tolerated  DVT prophylaxis: Heparin subq Code  Status: Full Family Communication: Pt in room, family not at bedside Disposition Plan: Uncertain at this time  Consultants:   General Surgery  Oncology  Procedures:   Thoracentesis 9/29  Antimicrobials: Anti-infectives (From admission, onward)   Start     Dose/Rate Route Frequency Ordered Stop   01/01/19 2200  piperacillin-tazobactam (ZOSYN) IVPB 3.375 g     3.375 g 12.5 mL/hr over 240 Minutes Intravenous Every 8 hours 01/01/19 1450     01/01/19 1500  piperacillin-tazobactam (ZOSYN) IVPB 3.375 g     3.375 g 100 mL/hr over 30 Minutes Intravenous  Once 01/01/19 1450 01/01/19 2045       Subjective: Complaining of marked abd pain and nausea  Objective: Vitals:   01/02/19 1350 01/02/19 1409 01/02/19 1414 01/02/19 1649  BP: (!) 152/89 (!) 153/80  (!) 146/85  Pulse: (!) 112 (!) 112  (!) 106  Resp: 20 16  16   Temp: 98 F (36.7 C) 98.3 F (36.8 C)  98.5 F (36.9 C)  TempSrc: Oral Oral  Axillary  SpO2: 100% 98% 98% 98%  Weight:      Height:        Intake/Output Summary (Last 24 hours) at 01/02/2019 1814 Last data filed at 01/02/2019 1354 Gross per 24 hour  Intake 615 ml  Output 325 ml  Net 290 ml   Filed Weights   01/01/19 0416  Weight: 119.7 kg    Examination:  General exam: Appears to be in pain and nauseated  Respiratory system: Clear to auscultation. Respiratory effort normal. Cardiovascular system: S1 & S2 heard, RRR Gastrointestinal system: obese, tenderness over epigastric region Central nervous system: Alert and oriented. No focal neurological deficits. Extremities: Symmetric 5 x 5 power. Skin: No rashes, lesions  Psychiatry: Judgement and insight appear normal. Mood & affect appropriate.   Data Reviewed: I have personally reviewed following labs and imaging studies  CBC: Recent Labs  Lab 12/27/18 0931 01/01/19 0541 01/01/19 1838 01/02/19 0322  WBC 5.8 4.7 3.5* 3.1*  NEUTROABS 4.3  --  2.9  --   HGB 8.9* 7.3* 6.8* 6.9*  HCT 27.1* 22.5*  21.5* 21.0*  MCV 101.5* 106.1* 106.4* 106.1*  PLT 217 211 183 407   Basic Metabolic Panel: Recent Labs  Lab 12/27/18 0931 01/01/19 0541 01/01/19 1838 01/02/19 0322  NA 140 133* 137 139  K 4.5 3.6 3.5 3.6  CL 103 101 105 106  CO2 28 20* 21* 24  GLUCOSE 202* 326* 159* 156*  BUN 11 7 6 7   CREATININE 1.00 0.88 0.82 0.79  CALCIUM 8.6* 7.8* 7.5* 7.7*   GFR: Estimated Creatinine Clearance: 115.1 mL/min (by C-G formula based on SCr of 0.79 mg/dL). Liver Function Tests: Recent Labs  Lab 12/27/18 0931 01/01/19 0541 01/01/19 1838 01/02/19 0322  AST 17 27 25 24   ALT 10 17 16 14   ALKPHOS 100 84 80 87  BILITOT 0.3 0.5 0.6 0.7  PROT 7.6 7.3 6.9 7.1  ALBUMIN 3.0* 2.8* 2.7* 2.7*   Recent Labs  Lab 01/01/19 0541  LIPASE 14   No results for input(s): AMMONIA in the last 168 hours. Coagulation Profile: Recent Labs  Lab 01/01/19 1838  INR 1.1   Cardiac Enzymes: No results for input(s): CKTOTAL, CKMB, CKMBINDEX, TROPONINI in the last 168 hours. BNP (last 3 results) No results for input(s): PROBNP in the last 8760 hours. HbA1C: No results for input(s): HGBA1C in the  last 72 hours. CBG: Recent Labs  Lab 01/01/19 2337 01/02/19 0350 01/02/19 0747 01/02/19 1247 01/02/19 1747  GLUCAP 134* 149* 139* 136* 126*   Lipid Profile: No results for input(s): CHOL, HDL, LDLCALC, TRIG, CHOLHDL, LDLDIRECT in the last 72 hours. Thyroid Function Tests: Recent Labs    01/01/19 1838  TSH 41.681*  FREET4 0.55*   Anemia Panel: Recent Labs    01/01/19 1838  VITAMINB12 2,017*   Sepsis Labs: Recent Labs  Lab 01/01/19 1659 01/01/19 1838  PROCALCITON  --  0.15  LATICACIDVEN 1.0 1.0    Recent Results (from the past 240 hour(s))  SARS CORONAVIRUS 2 (TAT 6-24 HRS) Nasopharyngeal Nasopharyngeal Swab     Status: None   Collection Time: 01/01/19  6:43 AM   Specimen: Nasopharyngeal Swab  Result Value Ref Range Status   SARS Coronavirus 2 NEGATIVE NEGATIVE Final    Comment:  (NOTE) SARS-CoV-2 target nucleic acids are NOT DETECTED. The SARS-CoV-2 RNA is generally detectable in upper and lower respiratory specimens during the acute phase of infection. Negative results do not preclude SARS-CoV-2 infection, do not rule out co-infections with other pathogens, and should not be used as the sole basis for treatment or other patient management decisions. Negative results must be combined with clinical observations, patient history, and epidemiological information. The expected result is Negative. Fact Sheet for Patients: SugarRoll.be Fact Sheet for Healthcare Providers: https://www.woods-mathews.com/ This test is not yet approved or cleared by the [place] FDA and  has been authorized for detection and/or diagnosis of SARS-CoV-2 by FDA under an Emergency Use Authorization (EUA). This EUA will remain  in effect (meaning this test can be used) for the duration of the COVID-19 declaration under Section 56 4(b)(1) of the Act, 21 U.S.C. section 360bbb-3(b)(1), unless the authorization is terminated or revoked sooner. Performed at [facility], [place] [address] [place], [place] [address]   Gram stain     Status: None   Collection Time: 01/02/19 12:40 PM   Specimen: Pleura  Result Value Ref Range Status   Specimen Description PLEURAL LEFT  Final   Special Requests NONE  Final   Gram Stain   Final    MODERATE WBC PRESENT,BOTH PMN AND MONONUCLEAR NO ORGANISMS SEEN Performed at [facility], [address], [place],  [address]    Report Status 01/02/2019 FINAL  Final     Radiology Studies: Dg Chest 1 View  Result Date: 01/02/2019 CLINICAL DATA:  Post thoracentesis EXAM: CHEST  1 VIEW COMPARISON:  01/01/2019 FINDINGS: Status post right thoracentesis with decreasing right effusion. No pneumothorax. Medial right upper lobe suprahilar mass again noted, unchanged. Right Port-A-Cath is unchanged. Left lung  clear. Heart is normal size. IMPRESSION: Decreasing right effusion following thoracentesis. No pneumothorax. Otherwise no change. Electronically Signed   By: Rolm Baptise M.D.   On: 01/02/2019 12:16   Dg Chest 2 View  Result Date: 01/01/2019 CLINICAL DATA:  Shortness of breath EXAM: CHEST - 2 VIEW COMPARISON:  Twenty days ago FINDINGS: Right perihilar opacity (likely radiation fibrosis by CT) with volume loss and small to moderate pleural effusion. Pleural fluid has increased from prior. The left lung remains clear. Normal heart size. Port on the right with tip at the SVC. IMPRESSION: Small to moderate right pleural effusion with increase from 3 weeks ago. Electronically Signed   By: Monte Fantasia M.D.   On: 01/01/2019 05:29   Ct Angio Chest Pe W And/or Wo Contrast  Result Date: 01/01/2019 CLINICAL  DATA:  Shortness of breath. Stage IV lung carcinoma. Abdominal pain with nausea EXAM: CT ANGIOGRAPHY CHEST CT ABDOMEN AND PELVIS WITH CONTRAST TECHNIQUE: Multidetector CT imaging of the chest was performed using the standard protocol during bolus administration of intravenous contrast. Multiplanar CT image reconstructions and MIPs were obtained to evaluate the vascular anatomy. Multidetector CT imaging of the abdomen and pelvis was performed using the standard protocol during bolus administration of intravenous contrast. CONTRAST:  185mL OMNIPAQUE IOHEXOL 350 MG/ML SOLN COMPARISON:  CT angiogram chest December 10, 2018; CT abdomen and pelvis November 29, 2018; chest radiograph January 01, 2019 FINDINGS: CTA CHEST FINDINGS Cardiovascular: There is no demonstrable pulmonary embolus. There is no thoracic aortic aneurysm or dissection. The visualized great vessels appear unremarkable. There is rather minimal pericardial fluid. There is no pericardial thickening. Port-A-Cath tip is in the superior vena cava. Mediastinum/Nodes: There remains extensive axillary adenopathy on the right. The largest individual lymph node  in the right axilla measures 3.4 x 2.7 cm. There is adenopathy in the right paratracheal region with the largest lymph node in this region measuring 2.3 x 2.1 cm. There is soft tissue prominence in the hilum which is inseparable from lung collapse. The appearance is similar to recent study. No esophageal lesions are evident. Lungs/Pleura: Sizable pleural effusion on the right remains with consolidation/compressive atelectasis throughout the right middle and lower lobes. There is airspace consolidation in the medial aspect of the right upper lobe, stable. On the left, there are areas of relatively mild lower lobe atelectatic change. No consolidation seen on the left. Musculoskeletal: No blastic or lytic bone lesions are evident. No chest wall lesions appreciable. Review of the MIP images confirms the above findings. CT ABDOMEN and PELVIS FINDINGS Hepatobiliary: No focal liver lesions are appreciable. The gallbladder is mildly contracted with gallbladder wall mildly thickened. Gallstones were better seen on recent CT. The proximal common bile duct measures 12 mm which is dilated. The common bile duct tapers distally without mass or calculus evident by radiography. Pancreas: There is no appreciable pancreatic mass or inflammatory focus. Spleen: No splenic lesions are evident. Adrenals/Urinary Tract: Adrenals bilaterally appear normal. Kidneys bilaterally show no evident mass or hydronephrosis on either side. Urinary bladder is midline with wall thickness within normal limits. Stomach/Bowel: There is no appreciable bowel wall or mesenteric thickening. There is no evident bowel obstruction. No free air or portal venous air. The terminal ileum appears unremarkable. Vascular/Lymphatic: There is no abdominal aortic aneurysm. There is aortic atherosclerosis. There are mildly prominent lymph nodes in the pelvis, stable. The largest lymph node is in the right internal iliac node chain measuring 2.4 x 1.7 cm. There are multiple  retroperitoneal lymph nodes, similar to prior study with the largest of these retroperitoneal lymph nodes measuring 0.9 cm in short axis diameter. No new lymph node prominence evident. Reproductive: Uterus is retroverted. Leiomyomatous changes in the uterus are grossly stable compared to the previous study. No extrauterine pelvic mass is evident. Other: Appendix not seen. No periappendiceal region inflammation. No abscess or ascites evident in the abdomen or pelvis. Musculoskeletal: No blastic or lytic bone lesions. No intramuscular or abdominal wall lesions. Review of the MIP images confirms the above findings. IMPRESSION: CT angiogram chest: 1. No demonstrable pulmonary embolus. No thoracic aortic aneurysm or dissection. 2. Sizable right pleural effusion with consolidation and compressive atelectasis throughout much of the right lung. Essentially complete collapse of the right middle and lower lobes remains. Consolidation medial aspect of the right upper lobe is  present. 3. Right paratracheal and right axillary adenopathy, similar to recent study. 4.  Left lower lobe atelectatic change. CT abdomen and pelvis: 1. Areas of lymph node prominence, primarily in the right internal iliac chain, stable and likely of neoplastic etiology given the known lung carcinoma. No new adenopathy. 2. Gallbladder wall is thickened. Known gallstones are better seen on recent CT. There is mild biliary duct dilatation. Correlation with ultrasound to better assess the gallbladder advised given the degree of wall thickening in this area. 3.  Leiomyomatous uterus. 4. No bowel obstruction. No abscess in the abdomen or pelvis. No periappendiceal region inflammation. 5. No renal or ureteral calculi. No hydronephrosis. Urinary bladder wall thickness within normal limits. Electronically Signed   By: Lowella Grip III M.D.   On: 01/01/2019 08:12   Nm Hepatobiliary Liver Func  Result Date: 01/01/2019 CLINICAL DATA:  Cholelithiasis, RIGHT  upper quadrant pain EXAM: NUCLEAR MEDICINE HEPATOBILIARY IMAGING TECHNIQUE: Sequential images of the abdomen were obtained out to 60 minutes following intravenous administration of radiopharmaceutical. RADIOPHARMACEUTICALS:  5.5 mCi Tc-32m  Choletec IV COMPARISON:  None Correlation: Ultrasound abdomen 01/01/2019, CT abdomen and pelvis 01/01/2019 FINDINGS: Prompt tracer extraction from bloodstream indicating normal hepatocellular function. Prompt excretion of tracer into biliary tree. Small bowel visualized at 11 minutes. At 1 hour gallbladder had not visualized. Patient then received 4 mg of morphine sulfate IV and imaging was continued for 30 minutes. Faint visualization of the gallbladder following morphine administration. Gallbladder was contracted on prior CT and ultrasound exams exam with mild wall thickening. Delayed visualization of the gallbladder may be seen in patients with chronic cholecystitis. IMPRESSION: Delayed visualization of the gallbladder, seen only following morphine augmentation, raising question of chronic cholecystitis. Gallbladder was contracted with a thickened wall on prior CT and ultrasound exams. Patent CBD. Electronically Signed   By: Lavonia Dana M.D.   On: 01/01/2019 16:18   Ct Abdomen Pelvis W Contrast  Result Date: 01/01/2019 CLINICAL DATA:  Shortness of breath. Stage IV lung carcinoma. Abdominal pain with nausea EXAM: CT ANGIOGRAPHY CHEST CT ABDOMEN AND PELVIS WITH CONTRAST TECHNIQUE: Multidetector CT imaging of the chest was performed using the standard protocol during bolus administration of intravenous contrast. Multiplanar CT image reconstructions and MIPs were obtained to evaluate the vascular anatomy. Multidetector CT imaging of the abdomen and pelvis was performed using the standard protocol during bolus administration of intravenous contrast. CONTRAST:  138mL OMNIPAQUE IOHEXOL 350 MG/ML SOLN COMPARISON:  CT angiogram chest December 10, 2018; CT abdomen and pelvis November 29, 2018; chest radiograph January 01, 2019 FINDINGS: CTA CHEST FINDINGS Cardiovascular: There is no demonstrable pulmonary embolus. There is no thoracic aortic aneurysm or dissection. The visualized great vessels appear unremarkable. There is rather minimal pericardial fluid. There is no pericardial thickening. Port-A-Cath tip is in the superior vena cava. Mediastinum/Nodes: There remains extensive axillary adenopathy on the right. The largest individual lymph node in the right axilla measures 3.4 x 2.7 cm. There is adenopathy in the right paratracheal region with the largest lymph node in this region measuring 2.3 x 2.1 cm. There is soft tissue prominence in the hilum which is inseparable from lung collapse. The appearance is similar to recent study. No esophageal lesions are evident. Lungs/Pleura: Sizable pleural effusion on the right remains with consolidation/compressive atelectasis throughout the right middle and lower lobes. There is airspace consolidation in the medial aspect of the right upper lobe, stable. On the left, there are areas of relatively mild lower lobe atelectatic change. No consolidation  seen on the left. Musculoskeletal: No blastic or lytic bone lesions are evident. No chest wall lesions appreciable. Review of the MIP images confirms the above findings. CT ABDOMEN and PELVIS FINDINGS Hepatobiliary: No focal liver lesions are appreciable. The gallbladder is mildly contracted with gallbladder wall mildly thickened. Gallstones were better seen on recent CT. The proximal common bile duct measures 12 mm which is dilated. The common bile duct tapers distally without mass or calculus evident by radiography. Pancreas: There is no appreciable pancreatic mass or inflammatory focus. Spleen: No splenic lesions are evident. Adrenals/Urinary Tract: Adrenals bilaterally appear normal. Kidneys bilaterally show no evident mass or hydronephrosis on either side. Urinary bladder is midline with wall thickness  within normal limits. Stomach/Bowel: There is no appreciable bowel wall or mesenteric thickening. There is no evident bowel obstruction. No free air or portal venous air. The terminal ileum appears unremarkable. Vascular/Lymphatic: There is no abdominal aortic aneurysm. There is aortic atherosclerosis. There are mildly prominent lymph nodes in the pelvis, stable. The largest lymph node is in the right internal iliac node chain measuring 2.4 x 1.7 cm. There are multiple retroperitoneal lymph nodes, similar to prior study with the largest of these retroperitoneal lymph nodes measuring 0.9 cm in short axis diameter. No new lymph node prominence evident. Reproductive: Uterus is retroverted. Leiomyomatous changes in the uterus are grossly stable compared to the previous study. No extrauterine pelvic mass is evident. Other: Appendix not seen. No periappendiceal region inflammation. No abscess or ascites evident in the abdomen or pelvis. Musculoskeletal: No blastic or lytic bone lesions. No intramuscular or abdominal wall lesions. Review of the MIP images confirms the above findings. IMPRESSION: CT angiogram chest: 1. No demonstrable pulmonary embolus. No thoracic aortic aneurysm or dissection. 2. Sizable right pleural effusion with consolidation and compressive atelectasis throughout much of the right lung. Essentially complete collapse of the right middle and lower lobes remains. Consolidation medial aspect of the right upper lobe is present. 3. Right paratracheal and right axillary adenopathy, similar to recent study. 4.  Left lower lobe atelectatic change. CT abdomen and pelvis: 1. Areas of lymph node prominence, primarily in the right internal iliac chain, stable and likely of neoplastic etiology given the known lung carcinoma. No new adenopathy. 2. Gallbladder wall is thickened. Known gallstones are better seen on recent CT. There is mild biliary duct dilatation. Correlation with ultrasound to better assess the  gallbladder advised given the degree of wall thickening in this area. 3.  Leiomyomatous uterus. 4. No bowel obstruction. No abscess in the abdomen or pelvis. No periappendiceal region inflammation. 5. No renal or ureteral calculi. No hydronephrosis. Urinary bladder wall thickness within normal limits. Electronically Signed   By: Lowella Grip III M.D.   On: 01/01/2019 08:12   Mr Abdomen Mrcp Wo Contrast  Result Date: 01/01/2019 CLINICAL DATA:  Abdominal pain for 3 months. Nausea. Constipation. Shortness of breath. Stage IV lung cancer. Gallstones. EXAM: MRI ABDOMEN WITHOUT CONTRAST  (INCLUDING MRCP) TECHNIQUE: Multiplanar multisequence MR imaging of the abdomen was performed. Heavily T2-weighted images of the biliary and pancreatic ducts were obtained, and three-dimensional MRCP images were rendered by post processing. COMPARISON:  01/01/2019 abdominal ultrasound. Abdominopelvic CT of 01/01/2019. FINDINGS: Mild limitations secondary to patient body habitus. Lower chest: Moderate right pleural effusion.  Normal heart size. Hepatobiliary: Normal noncontrast appearance of the liver. There is borderline to minimal intrahepatic biliary duct dilatation, including on 22/3. The gallbladder is stone filled, with stones up to 1.8 cm. The common duct is  dilated, including at maximally 1.5 cm on 83/5. Tapers gradually in the region of the pancreatic head. No obstructive stone or mass identified. Pancreas:  Normal, without mass or ductal dilatation. Spleen:  Normal in size, without focal abnormality. Adrenals/Urinary Tract: Normal adrenal glands. Normal kidneys, without hydronephrosis. Stomach/Bowel:  Normal stomach and abdominal bowel loops. Vascular/Lymphatic:  Normal aortic caliber. Prominent abdominal nodes within the retroperitoneum. None are pathologic by size criteria. Retrocrural adenopathy, including at 1.1 cm on 20/3, suspicious. Other:  No ascites. Musculoskeletal: No acute osseous abnormality. IMPRESSION: 1.  Patient body habitus degradation. 2. Cholelithiasis with common duct dilatation. No cause identified. Most likely within normal variation, given today's normal bilirubin level. 3. Retrocrural adenopathy, suspicious for nodal metastasis in this patient with a history of lung cancer. Electronically Signed   By: Abigail Miyamoto M.D.   On: 01/01/2019 20:41   Mr 3d Recon At Scanner  Result Date: 01/01/2019 CLINICAL DATA:  Abdominal pain for 3 months. Nausea. Constipation. Shortness of breath. Stage IV lung cancer. Gallstones. EXAM: MRI ABDOMEN WITHOUT CONTRAST  (INCLUDING MRCP) TECHNIQUE: Multiplanar multisequence MR imaging of the abdomen was performed. Heavily T2-weighted images of the biliary and pancreatic ducts were obtained, and three-dimensional MRCP images were rendered by post processing. COMPARISON:  01/01/2019 abdominal ultrasound. Abdominopelvic CT of 01/01/2019. FINDINGS: Mild limitations secondary to patient body habitus. Lower chest: Moderate right pleural effusion.  Normal heart size. Hepatobiliary: Normal noncontrast appearance of the liver. There is borderline to minimal intrahepatic biliary duct dilatation, including on 22/3. The gallbladder is stone filled, with stones up to 1.8 cm. The common duct is dilated, including at maximally 1.5 cm on 83/5. Tapers gradually in the region of the pancreatic head. No obstructive stone or mass identified. Pancreas:  Normal, without mass or ductal dilatation. Spleen:  Normal in size, without focal abnormality. Adrenals/Urinary Tract: Normal adrenal glands. Normal kidneys, without hydronephrosis. Stomach/Bowel:  Normal stomach and abdominal bowel loops. Vascular/Lymphatic:  Normal aortic caliber. Prominent abdominal nodes within the retroperitoneum. None are pathologic by size criteria. Retrocrural adenopathy, including at 1.1 cm on 20/3, suspicious. Other:  No ascites. Musculoskeletal: No acute osseous abnormality. IMPRESSION: 1. Patient body habitus degradation.  2. Cholelithiasis with common duct dilatation. No cause identified. Most likely within normal variation, given today's normal bilirubin level. 3. Retrocrural adenopathy, suspicious for nodal metastasis in this patient with a history of lung cancer. Electronically Signed   By: Abigail Miyamoto M.D.   On: 01/01/2019 20:41   US Abdomen Limited  Result Date: 01/01/2019 CLINICAL DATA:  Abdominal pain EXAM: ULTRASOUND ABDOMEN LIMITED RIGHT UPPER QUADRANT COMPARISON:  CT from the same day. FINDINGS: Gallbladder: There is gallbladder wall thickening. There is no pericholecystic free fluid. The sonographic Percell Miller sign is reported as positive. Gallstones are noted measuring up to approximately 1.1 cm. Evaluation of the gallbladder was limited by patient body habitus. The gallbladder appears somewhat contracted. Common bile duct: Diameter: 1.2 cm Liver: Diffuse increased echogenicity with slightly heterogeneous liver. Appearance typically secondary to fatty infiltration. Fibrosis secondary consideration. No secondary findings of cirrhosis noted. No focal hepatic lesion or intrahepatic biliary duct dilatation. Portal vein is patent on color Doppler imaging with normal direction of blood flow towards the liver. Other: A right-sided pleural effusion is noted. IMPRESSION: 1. Evaluation limited by patient body habitus and poor sonographic windows. 2. There is cholelithiasis with gallbladder wall thickening and a questionable positive sonographic Murphy sign. Findings are concerning for acute calculus cholecystitis in the appropriate clinical setting. HIDA scan may be  useful for confirmation given the gallbladder is not significantly distended. 3. Dilated common bile duct. This raises concern for an obstructing process such as choledocholithiasis. Correlation with laboratory studies is recommended. If there is clinical concern for an obstructing process, follow-up with MRCP/ERCP is recommended. 4. Right-sided pleural effusion. 5.  Probable underlying hepatic steatosis. Electronically Signed   By: Constance Holster M.D.   On: 01/01/2019 09:33   US Thoracentesis Asp Pleural Space W/img Guide  Result Date: 01/02/2019 INDICATION: Patient with history of lung cancer with recurrent malignant right pleural effusion; request made for diagnostic and therapeutic right thoracentesis. EXAM: ULTRASOUND GUIDED DIAGNOSTIC AND THERAPEUTIC RIGHT THORACENTESIS MEDICATIONS: None COMPLICATIONS: None immediate. PROCEDURE: An ultrasound guided thoracentesis was thoroughly discussed with the patient and questions answered. The benefits, risks, alternatives and complications were also discussed. The patient understands and wishes to proceed with the procedure. Written consent was obtained. Ultrasound was performed to localize and mark an adequate pocket of fluid in the right chest. The area was then prepped and draped in the normal sterile fashion. 1% Lidocaine was used for local anesthesia. Under ultrasound guidance a 6 Fr Safe-T-Centesis catheter was introduced. Thoracentesis was performed. The catheter was removed and a dressing applied. FINDINGS: A total of approximately 920 cc of yellow fluid was removed. Samples were sent to the laboratory as requested by the clinical team. IMPRESSION: Successful ultrasound guided diagnostic and therapeutic right thoracentesis yielding 920 cc of pleural fluid. Read by: Rowe Robert, PA-C Electronically Signed   By: Jerilynn Mages.  Shick M.D.   On: 01/02/2019 12:02    Scheduled Meds:  sodium chloride   Intravenous Once   aspirin  81 mg Oral Daily   atorvastatin  40 mg Oral q1800   Chlorhexidine Gluconate Cloth  6 each Topical Daily   docusate sodium  100 mg Oral BID   DULoxetine  60 mg Oral Daily   feeding supplement (ENSURE ENLIVE)  237 mL Oral BID BM   heparin  5,000 Units Subcutaneous Q8H   insulin aspart  0-9 Units Subcutaneous Q4H   insulin glargine  50 Units Subcutaneous QAC breakfast   levothyroxine   150 mcg Oral QAC breakfast   lidocaine       metoprolol succinate  25 mg Oral QPM   metoprolol succinate  50 mg Oral Daily   pantoprazole  40 mg Oral Daily   polyethylene glycol  17 g Oral Daily   senna-docusate  2 tablet Oral QHS   Continuous Infusions:  lactated ringers 75 mL/hr at 01/02/19 0219   piperacillin-tazobactam (ZOSYN)  IV 3.375 g (01/02/19 1650)     LOS: 0 days   Marylu Lund, MD [facility] Pager On Amion  If 7PM-7AM, please contact night-coverage 01/02/2019, 6:14 PM

## 2019-01-02 NOTE — Progress Notes (Signed)
Initial Nutrition Assessment  RD working remotely.   DOCUMENTATION CODES:   Obesity unspecified  INTERVENTION:  - diet advancement as medically feasible.    NUTRITION DIAGNOSIS:   Increased nutrient needs related to chronic illness, cancer and cancer related treatments as evidenced by estimated needs.  GOAL:   Patient will meet greater than or equal to 90% of their needs  MONITOR:   Diet advancement, Labs, Weight trends  REASON FOR ASSESSMENT:   Malnutrition Screening Tool  ASSESSMENT:   53 y.o. female with medical history of metastatic adenocarcinoma, non-ischemic cardiomyopathy, HTN, hypothyroidism, malignant pleural effusion s/p thoracentesis x3, hypothyroidism. Patient presented to the ED on 9/28 with complaints of abdominal pain. She reported that pain has been ongoing for 3 months but was worse for the past 3 days. Patient also reported nausea and constipation. Patient's last chemo was on 9/23. She has been having ongoing SOB and chronic cough. Abdominal ultrasound showed cholecystitis. CT scan showed gallbladder wall thickening, no other acute abnormalities. She was admitted for recurrent R pleural effusion for thoracentesis as well as pain control of abdomen.  Patient has been NPO since admission. Unable to reach patient by phone at this time. Per chart review, current weight is 264 lb and weight has been relatively stable over the past 3-3.5 months. Patient had recurrent R-side thoracentesis today with 920 ml yellow fluid removed.   Per notes: - acute on chronic abdominal pain--CT showed gallbladder wall thickening, gallstones, CBD dilation; HIDA scan showed chronic cholecystitis without any evidence of acute component - malignant recurrent R pleural effusion--s/p 3 thoracenteses so far; patient refusing pleurex placement - metastatic NSCLC - depression   Labs reviewed; CBGs: 149, 139, and 136 mg/dl, Ca: 7.7 mg/dl. Medications reviewed; 100 mg colace BID, sliding  scale novolog, 50 units lantus/day, 40 mg oral protonix/day, 1 packet miralax/day, 2 tablets senokot/day.  IVF; LR @ 75 ml/hr.     NUTRITION - FOCUSED PHYSICAL EXAM:  unable to complete at this time.   Diet Order:   Diet Order            Diet NPO time specified Except for: Sips with Meds, Ice Chips  Diet effective now              EDUCATION NEEDS:   No education needs have been identified at this time  Skin:  Skin Assessment: Reviewed RN Assessment  Last BM:  9/27  Height:   Ht Readings from Last 1 Encounters:  01/01/19 5\' 9"  (1.753 m)    Weight:   Wt Readings from Last 1 Encounters:  01/01/19 119.7 kg    Ideal Body Weight:  65.9 kg  BMI:  Body mass index is 38.99 kg/m.  Estimated Nutritional Needs:   Kcal:  2250-2450 kcal  Protein:  115-125 grams  Fluid:  >/= 2.2 L/day      Jarome Matin, MS, RD, LDN, [facility] Inpatient Clinical Dietitian Pager # [phone] After hours/weekend pager # [phone]

## 2019-01-02 NOTE — Progress Notes (Signed)
CRITICAL VALUE ALERT  Critical Value:  hgb 6.8  Date & Time Notied:  01-01-2019 @ 2030  Provider Notified: Schorr, NP  Orders Received/Actions taken: No new orders

## 2019-01-02 NOTE — Procedures (Signed)
Ultrasound-guided diagnostic and therapeutic right thoracentesis performed yielding 920 cc of yellow fluid. No immediate complications. Follow-up chest x-ray pending. The fluid was sent to the lab for preordered studies. EBL none.

## 2019-01-03 ENCOUNTER — Encounter (HOSPITAL_COMMUNITY): Payer: Self-pay | Admitting: General Surgery

## 2019-01-03 DIAGNOSIS — K8050 Calculus of bile duct without cholangitis or cholecystitis without obstruction: Secondary | ICD-10-CM

## 2019-01-03 DIAGNOSIS — Z9889 Other specified postprocedural states: Secondary | ICD-10-CM

## 2019-01-03 DIAGNOSIS — D701 Agranulocytosis secondary to cancer chemotherapy: Secondary | ICD-10-CM

## 2019-01-03 DIAGNOSIS — C7801 Secondary malignant neoplasm of right lung: Secondary | ICD-10-CM

## 2019-01-03 DIAGNOSIS — T451X5A Adverse effect of antineoplastic and immunosuppressive drugs, initial encounter: Secondary | ICD-10-CM

## 2019-01-03 DIAGNOSIS — K810 Acute cholecystitis: Secondary | ICD-10-CM

## 2019-01-03 LAB — DIFFERENTIAL
Abs Immature Granulocytes: 0.01 10*3/uL (ref 0.00–0.07)
Basophils Absolute: 0 10*3/uL (ref 0.0–0.1)
Basophils Relative: 1 %
Eosinophils Absolute: 0 10*3/uL (ref 0.0–0.5)
Eosinophils Relative: 1 %
Immature Granulocytes: 1 %
Lymphocytes Relative: 29 %
Lymphs Abs: 0.5 10*3/uL — ABNORMAL LOW (ref 0.7–4.0)
Monocytes Absolute: 0.1 10*3/uL (ref 0.1–1.0)
Monocytes Relative: 4 %
Neutro Abs: 1.1 10*3/uL — ABNORMAL LOW (ref 1.7–7.7)
Neutrophils Relative %: 64 %

## 2019-01-03 LAB — TYPE AND SCREEN
ABO/RH(D): A POS
Antibody Screen: NEGATIVE
Unit division: 0

## 2019-01-03 LAB — BPAM RBC
Blood Product Expiration Date: 202010192359
ISSUE DATE / TIME: 202009291347
Unit Type and Rh: 6200

## 2019-01-03 LAB — COMPREHENSIVE METABOLIC PANEL
ALT: 15 U/L (ref 0–44)
AST: 28 U/L (ref 15–41)
Albumin: 2.8 g/dL — ABNORMAL LOW (ref 3.5–5.0)
Alkaline Phosphatase: 102 U/L (ref 38–126)
Anion gap: 8 (ref 5–15)
BUN: 12 mg/dL (ref 6–20)
CO2: 25 mmol/L (ref 22–32)
Calcium: 7.6 mg/dL — ABNORMAL LOW (ref 8.9–10.3)
Chloride: 105 mmol/L (ref 98–111)
Creatinine, Ser: 0.87 mg/dL (ref 0.44–1.00)
GFR calc Af Amer: >60 mL/min (ref >60)
GFR calc non Af Amer: >60 mL/min (ref >60)
Glucose, Bld: 162 mg/dL — ABNORMAL HIGH (ref 70–99)
Potassium: 3.9 mmol/L (ref 3.5–5.1)
Sodium: 138 mmol/L (ref 135–145)
Total Bilirubin: 0.7 mg/dL (ref 0.3–1.2)
Total Protein: 7.5 g/dL (ref 6.5–8.1)

## 2019-01-03 LAB — GLUCOSE, CAPILLARY
Glucose-Capillary: 118 mg/dL — ABNORMAL HIGH (ref 70–99)
Glucose-Capillary: 135 mg/dL — ABNORMAL HIGH (ref 70–99)
Glucose-Capillary: 146 mg/dL — ABNORMAL HIGH (ref 70–99)
Glucose-Capillary: 67 mg/dL — ABNORMAL LOW (ref 70–99)
Glucose-Capillary: 71 mg/dL (ref 70–99)
Glucose-Capillary: 81 mg/dL (ref 70–99)
Glucose-Capillary: 89 mg/dL (ref 70–99)
Glucose-Capillary: 91 mg/dL (ref 70–99)

## 2019-01-03 LAB — CBC
HCT: 26.5 % — ABNORMAL LOW (ref 36.0–46.0)
Hemoglobin: 8.8 g/dL — ABNORMAL LOW (ref 12.0–15.0)
MCH: 33.8 pg (ref 26.0–34.0)
MCHC: 33.2 g/dL (ref 30.0–36.0)
MCV: 101.9 fL — ABNORMAL HIGH (ref 80.0–100.0)
Platelets: 154 10*3/uL (ref 150–400)
RBC: 2.6 MIL/uL — ABNORMAL LOW (ref 3.87–5.11)
RDW: 20.3 % — ABNORMAL HIGH (ref 11.5–15.5)
WBC: 1.6 10*3/uL — ABNORMAL LOW (ref 4.0–10.5)
nRBC: 0 % (ref 0.0–0.2)

## 2019-01-03 LAB — CYTOLOGY - NON PAP

## 2019-01-03 LAB — URINE CULTURE: Culture: NO GROWTH

## 2019-01-03 MED ORDER — TBO-FILGRASTIM 480 MCG/0.8ML ~~LOC~~ SOSY
480.0000 ug | PREFILLED_SYRINGE | Freq: Every day | SUBCUTANEOUS | Status: AC
  Administered 2019-01-03 – 2019-01-07 (×5): 480 ug via SUBCUTANEOUS
  Filled 2019-01-03 (×5): qty 0.8

## 2019-01-03 MED ORDER — DICYCLOMINE HCL 10 MG/5ML PO SOLN
10.0000 mg | Freq: Two times a day (BID) | ORAL | Status: DC
  Administered 2019-01-03: 10 mg via ORAL
  Filled 2019-01-03 (×3): qty 5

## 2019-01-03 MED ORDER — TBO-FILGRASTIM 300 MCG/0.5ML ~~LOC~~ SOSY
300.0000 ug | PREFILLED_SYRINGE | Freq: Once | SUBCUTANEOUS | Status: DC
  Filled 2019-01-03: qty 0.5

## 2019-01-03 MED ORDER — GLUCERNA SHAKE PO LIQD
237.0000 mL | Freq: Two times a day (BID) | ORAL | Status: DC
  Administered 2019-01-05 – 2019-01-06 (×3): 237 mL via ORAL
  Filled 2019-01-03 (×13): qty 237

## 2019-01-03 MED ORDER — DICYCLOMINE HCL 10 MG/5ML PO SOLN
10.0000 mg | Freq: Three times a day (TID) | ORAL | Status: DC | PRN
  Filled 2019-01-03: qty 5

## 2019-01-03 NOTE — Progress Notes (Addendum)
PROGRESS NOTE    Marilyn [name]  EHU:[id number] DOB: 1965-06-30 DOA: 01/01/2019 PCP: [name], FNP    Brief Narrative:  53 y.o. female with Past medical history of metastatic adenocarcinoma, nonischemic cardiomyopathy, HTN, hypothyroidism, malignant pleural effusion S/P thoracentesis x3, hypothyroidism. Patient presents with complaints of abdominal pain.  She reports that this pain has been ongoing for 3 months but in last 3 days this is worsened. -On chemotherapy, last infusion was 9/23 of Avastin, Alimta, carboplatin -On admission also reported chronic cough and shortness of breath -In the ED imaging was concerning for gallbladder wall thickening also noted to have recurrent right pleural effusion  Assessment & Plan:   1.    Acute on chronic cholecystitis  -CT abdomen and pelvis is positive for thick gallbladder, gallstones, CBD dilation -Ultrasound noted dilated CBD, GB wall thickening  -HIDA scan performed shows evidence of chronic cholecystitis  -Continue Empiric IV Zosyn, IV fluids -Will request general surgery consultation to determine if she needs of her gallbladder drain -Would not be a good candidate for cholecystectomy given active chemotherapy, metastatic cancer -will give Neulasta for Neutropenia from Chemo  2.  Malignant recurrent right pleural effusion -Patient has metastatic adenocarcinoma of the lung and has recurrent right-sided pleural effusion. -Pt reportedly does not wish to undergo Pleurx catheter placement  -Pt is s/p thoracentesis 9/29, yielding 920cc fluid -Cont to wean O2 as tolerated -Will discuss with Dr.Kale  3.  Metastatic non-small cell lung cancer. -Followed by Dr. Irene Limbo -Currently on chemotherapy with carboplatin, Alimta and Avastin.  Last cycle on 12/27/2018 -Oncology following -White count is worsening, will discuss with Dr.Kale regarding Neupogen  4.  Depression. -Continue on cymbalta  6.  Chronic diastolic dysfunction. -Prior  history of nonischemic cardiomyopathy showed normal EF -Seems to be stable at this time  7. Type 2 Diabetes Mellitus, poorly controled without complication -last hemoglobin A1c was 10. -Continue Lantus, SSI coverage as tolerated  DVT prophylaxis: Heparin subq Code Status: Full Family Communication: Pt in room, family not at bedside Disposition Plan: Uncertain at this time  Consultants:   General Surgery  Oncology  Procedures:   Thoracentesis 9/29  Antimicrobials: Anti-infectives (From admission, onward)   Start     Dose/Rate Route Frequency Ordered Stop   01/01/19 2200  piperacillin-tazobactam (ZOSYN) IVPB 3.375 g     3.375 g 12.5 mL/hr over 240 Minutes Intravenous Every 8 hours 01/01/19 1450     01/01/19 1500  piperacillin-tazobactam (ZOSYN) IVPB 3.375 g     3.375 g 100 mL/hr over 30 Minutes Intravenous  Once 01/01/19 1450 01/01/19 2045      Subjective: -Continues to complain of severe abdominal pain  Objective: Vitals:   01/02/19 1414 01/02/19 1649 01/02/19 2020 01/03/19 0413  BP:  (!) 146/85 134/81 132/85  Pulse:  (!) 106 96 98  Resp:  16 16 20   Temp:  98.5 F (36.9 C) 97.8 F (36.6 C) 97.6 F (36.4 C)  TempSrc:  Axillary Oral Oral  SpO2: 98% 98% 93% 92%  Weight:      Height:        Intake/Output Summary (Last 24 hours) at 01/03/2019 1248 Last data filed at 01/02/2019 1900 Gross per 24 hour  Intake 315 ml  Output 550 ml  Net -235 ml   Filed Weights   01/01/19 0416  Weight: 119.7 kg    Examination:  Gen: Orbitally obese African-American female, sitting up in bed, uncomfortable, AAO x3 HEENT: PERRLA, Neck supple, no JVD Lungs: Decreased breath  sounds at both bases CVS: RRR,No Gallops,Rubs or new Murmurs Abd: Soft obese, mild right-sided and epigastric tenderness, bowel sounds present Extremities: No edema Skin: no new rashes Psychiatry: Mood & affect appropriate.   Data Reviewed: I have personally reviewed following labs and imaging  studies  CBC: Recent Labs  Lab 01/01/19 0541 01/01/19 1838 01/02/19 0322 01/02/19 1937 01/03/19 0437  WBC 4.7 3.5* 3.1*  --  1.6*  NEUTROABS  --  2.9  --   --   --   HGB 7.3* 6.8* 6.9* 8.5* 8.8*  HCT 22.5* 21.5* 21.0* 25.6* 26.5*  MCV 106.1* 106.4* 106.1*  --  101.9*  PLT 211 183 183  --  623   Basic Metabolic Panel: Recent Labs  Lab 01/01/19 0541 01/01/19 1838 01/02/19 0322 01/03/19 0437  NA 133* 137 139 138  K 3.6 3.5 3.6 3.9  CL 101 105 106 105  CO2 20* 21* 24 25  GLUCOSE 326* 159* 156* 162*  BUN 7 6 7 12   CREATININE 0.88 0.82 0.79 0.87  CALCIUM 7.8* 7.5* 7.7* 7.6*   GFR: Estimated Creatinine Clearance: 105.8 mL/min (by C-G formula based on SCr of 0.87 mg/dL). Liver Function Tests: Recent Labs  Lab 01/01/19 0541 01/01/19 1838 01/02/19 0322 01/03/19 0437  AST 27 25 24 28   ALT 17 16 14 15   ALKPHOS 84 80 87 102  BILITOT 0.5 0.6 0.7 0.7  PROT 7.3 6.9 7.1 7.5  ALBUMIN 2.8* 2.7* 2.7* 2.8*   Recent Labs  Lab 01/01/19 0541  LIPASE 14   No results for input(s): AMMONIA in the last 168 hours. Coagulation Profile: Recent Labs  Lab 01/01/19 1838  INR 1.1   Cardiac Enzymes: No results for input(s): CKTOTAL, CKMB, CKMBINDEX, TROPONINI in the last 168 hours. BNP (last 3 results) No results for input(s): PROBNP in the last 8760 hours. HbA1C: No results for input(s): HGBA1C in the last 72 hours. CBG: Recent Labs  Lab 01/02/19 2018 01/03/19 0013 01/03/19 0408 01/03/19 0807 01/03/19 1155  GLUCAP 127* 135* 146* 118* 91   Lipid Profile: No results for input(s): CHOL, HDL, LDLCALC, TRIG, CHOLHDL, LDLDIRECT in the last 72 hours. Thyroid Function Tests: Recent Labs    01/01/19 1838  TSH 41.681*  FREET4 0.55*   Anemia Panel: Recent Labs    01/01/19 1838  VITAMINB12 2,017*   Sepsis Labs: Recent Labs  Lab 01/01/19 1659 01/01/19 1838  PROCALCITON  --  0.15  LATICACIDVEN 1.0 1.0    Recent Results (from the past 240 hour(s))  SARS CORONAVIRUS  2 (TAT 6-24 HRS) Nasopharyngeal Nasopharyngeal Swab     Status: None   Collection Time: 01/01/19  6:43 AM   Specimen: Nasopharyngeal Swab  Result Value Ref Range Status   SARS Coronavirus 2 NEGATIVE NEGATIVE Final    Comment: (NOTE) SARS-CoV-2 target nucleic acids are NOT DETECTED. The SARS-CoV-2 RNA is generally detectable in upper and lower respiratory specimens during the acute phase of infection. Negative results do not preclude SARS-CoV-2 infection, do not rule out co-infections with other pathogens, and should not be used as the sole basis for treatment or other patient management decisions. Negative results must be combined with clinical observations, patient history, and epidemiological information. The expected result is Negative. Fact Sheet for Patients: SugarRoll.be Fact Sheet for Healthcare Providers: https://www.woods-mathews.com/ This test is not yet approved or cleared by the [place] FDA and  has been authorized for detection and/or diagnosis of SARS-CoV-2 by FDA under an Emergency Use Authorization (EUA). This EUA will  remain  in effect (meaning this test can be used) for the duration of the COVID-19 declaration under Section 56 4(b)(1) of the Act, 21 U.S.C. section 360bbb-3(b)(1), unless the authorization is terminated or revoked sooner. Performed at [facility], [place] [address], [place], [place] [address]   Urine culture     Status: None   Collection Time: 01/01/19  9:31 AM   Specimen: Urine, Catheterized  Result Value Ref Range Status   Specimen Description   Final    URINE, CATHETERIZED Performed at [facility] [address], [place], [place] [address]    Special Requests   Final    NONE Performed at [facility], [place] [address], [place], [place] [address]    Culture   Final    NO GROWTH Performed at [facility], [place] [address], [place], [place]  [address]    Report Status 01/03/2019 FINAL  Final  Culture, blood (x 2)     Status: None (Preliminary result)   Collection Time: 01/01/19  9:03 PM   Specimen: BLOOD  Result Value Ref Range Status   Specimen Description   Final    BLOOD LEFT ANTECUBITAL Performed at [facility] [address], [place], [place] [address]    Special Requests   Final    BOTTLES DRAWN AEROBIC AND ANAEROBIC Blood Culture adequate volume Performed at [facility] [address], [place], [place] [address]    Culture   Final    NO GROWTH 1 DAY Performed at [facility], [place] [address] [place], [place] [address]    Report Status PENDING  Incomplete  Culture, blood (x 2)     Status: None (Preliminary result)   Collection Time: 01/01/19  9:03 PM   Specimen: BLOOD  Result Value Ref Range Status   Specimen Description   Final    BLOOD RIGHT ANTECUBITAL Performed at [facility] [address] [place], [place] [address]    Special Requests   Final    BOTTLES DRAWN AEROBIC AND ANAEROBIC Blood Culture adequate volume Performed at [facility] [address], [place], [place] [address]    Culture   Final    NO GROWTH 1 DAY Performed at [facility], [place] [address], [place], [place] [address]    Report Status PENDING  Incomplete  Culture, body fluid-bottle     Status: None (Preliminary result)   Collection Time: 01/02/19 12:40 PM   Specimen: Pleura  Result Value Ref Range Status   Specimen Description PLEURAL LEFT  Final   Special Requests NONE  Final   Culture   Final    NO GROWTH < 24 HOURS Performed at [facility], [place] [address], [place], [place] [address]    Report Status PENDING  Incomplete  Gram stain     Status: None   Collection Time: 01/02/19 12:40 PM   Specimen: Pleura  Result Value Ref Range Status   Specimen Description PLEURAL LEFT  Final   Special Requests NONE  Final   Gram Stain    Final    MODERATE WBC PRESENT,BOTH PMN AND MONONUCLEAR NO ORGANISMS SEEN Performed at [facility], [address] [place], [place] [address]    Report Status 01/02/2019 FINAL  Final     Radiology Studies: Dg Chest 1 View  Result Date: 01/02/2019 CLINICAL DATA:  Post thoracentesis EXAM: CHEST  1 VIEW COMPARISON:  01/01/2019 FINDINGS: Status post right thoracentesis with decreasing right effusion. No pneumothorax. Medial right upper lobe suprahilar mass again noted, unchanged. Right Port-A-Cath is unchanged. Left lung clear. Heart is normal size. IMPRESSION: Decreasing right effusion following thoracentesis. No pneumothorax. Otherwise no change. Electronically Signed   By: Rolm Baptise M.D.   On: 01/02/2019 12:16   Nm Hepatobiliary Liver Func  Result Date: 01/01/2019 CLINICAL DATA:  Cholelithiasis, RIGHT upper quadrant pain EXAM: NUCLEAR MEDICINE HEPATOBILIARY IMAGING TECHNIQUE: Sequential images of the abdomen were obtained out to 60 minutes following intravenous administration of radiopharmaceutical. RADIOPHARMACEUTICALS:  5.5 mCi Tc-65m  Choletec IV COMPARISON:  None Correlation: Ultrasound abdomen 01/01/2019, CT abdomen and pelvis 01/01/2019 FINDINGS: Prompt tracer extraction from bloodstream indicating normal hepatocellular function. Prompt excretion of tracer into biliary tree. Small bowel visualized at 11 minutes. At 1 hour gallbladder had not visualized. Patient then received 4 mg of morphine sulfate IV and imaging was continued for 30 minutes. Faint visualization of the gallbladder following morphine administration. Gallbladder was contracted on prior CT and ultrasound exams exam with mild wall thickening. Delayed visualization of the gallbladder may be seen in patients with chronic cholecystitis. IMPRESSION: Delayed visualization of the gallbladder, seen only following morphine augmentation, raising question of chronic cholecystitis. Gallbladder was contracted with a thickened wall on  prior CT and ultrasound exams. Patent CBD. Electronically Signed   By: Lavonia Dana M.D.   On: 01/01/2019 16:18   Mr Abdomen Mrcp Wo Contrast  Result Date: 01/01/2019 CLINICAL DATA:  Abdominal pain for 3 months. Nausea. Constipation. Shortness of breath. Stage IV lung cancer. Gallstones. EXAM: MRI ABDOMEN WITHOUT CONTRAST  (INCLUDING MRCP) TECHNIQUE: Multiplanar multisequence MR imaging of the abdomen was performed. Heavily T2-weighted images of the biliary and pancreatic ducts were obtained, and three-dimensional MRCP images were rendered by post processing. COMPARISON:  01/01/2019 abdominal ultrasound. Abdominopelvic CT of 01/01/2019. FINDINGS: Mild limitations secondary to patient body habitus. Lower chest: Moderate right pleural effusion.  Normal heart size. Hepatobiliary: Normal noncontrast appearance of the liver. There is borderline to minimal intrahepatic biliary duct dilatation, including on 22/3. The gallbladder is stone filled, with stones up to 1.8 cm. The common duct is dilated, including at maximally 1.5 cm on 83/5. Tapers gradually in the region of the pancreatic head. No obstructive stone or mass identified. Pancreas:  Normal, without mass or ductal dilatation. Spleen:  Normal in size, without focal abnormality. Adrenals/Urinary Tract: Normal adrenal glands. Normal kidneys, without hydronephrosis. Stomach/Bowel:  Normal stomach and abdominal bowel loops. Vascular/Lymphatic:  Normal aortic caliber. Prominent abdominal nodes within the retroperitoneum. None are pathologic by size criteria. Retrocrural adenopathy, including at 1.1 cm on 20/3, suspicious. Other:  No ascites. Musculoskeletal: No acute osseous abnormality. IMPRESSION: 1. Patient body habitus degradation. 2. Cholelithiasis with common duct dilatation. No cause identified. Most likely within normal variation, given today's normal bilirubin level. 3. Retrocrural adenopathy, suspicious for nodal metastasis in this patient with a history of  lung cancer. Electronically Signed   By: Abigail Miyamoto M.D.   On: 01/01/2019 20:41   Mr 3d Recon At Scanner  Result Date: 01/01/2019 CLINICAL DATA:  Abdominal pain for 3 months. Nausea. Constipation. Shortness of breath. Stage IV lung cancer. Gallstones. EXAM: MRI ABDOMEN WITHOUT CONTRAST  (INCLUDING MRCP) TECHNIQUE: Multiplanar multisequence MR imaging of the abdomen was performed. Heavily T2-weighted images of the biliary and pancreatic ducts were obtained, and three-dimensional MRCP images were rendered by post processing. COMPARISON:  01/01/2019 abdominal ultrasound. Abdominopelvic CT of 01/01/2019. FINDINGS: Mild limitations secondary to  patient body habitus. Lower chest: Moderate right pleural effusion.  Normal heart size. Hepatobiliary: Normal noncontrast appearance of the liver. There is borderline to minimal intrahepatic biliary duct dilatation, including on 22/3. The gallbladder is stone filled, with stones up to 1.8 cm. The common duct is dilated, including at maximally 1.5 cm on 83/5. Tapers gradually in the region of the pancreatic head. No obstructive stone or mass identified. Pancreas:  Normal, without mass or ductal dilatation. Spleen:  Normal in size, without focal abnormality. Adrenals/Urinary Tract: Normal adrenal glands. Normal kidneys, without hydronephrosis. Stomach/Bowel:  Normal stomach and abdominal bowel loops. Vascular/Lymphatic:  Normal aortic caliber. Prominent abdominal nodes within the retroperitoneum. None are pathologic by size criteria. Retrocrural adenopathy, including at 1.1 cm on 20/3, suspicious. Other:  No ascites. Musculoskeletal: No acute osseous abnormality. IMPRESSION: 1. Patient body habitus degradation. 2. Cholelithiasis with common duct dilatation. No cause identified. Most likely within normal variation, given today's normal bilirubin level. 3. Retrocrural adenopathy, suspicious for nodal metastasis in this patient with a history of lung cancer. Electronically Signed    By: Abigail Miyamoto M.D.   On: 01/01/2019 20:41   US Thoracentesis Asp Pleural Space W/img Guide  Result Date: 01/02/2019 INDICATION: Patient with history of lung cancer with recurrent malignant right pleural effusion; request made for diagnostic and therapeutic right thoracentesis. EXAM: ULTRASOUND GUIDED DIAGNOSTIC AND THERAPEUTIC RIGHT THORACENTESIS MEDICATIONS: None COMPLICATIONS: None immediate. PROCEDURE: An ultrasound guided thoracentesis was thoroughly discussed with the patient and questions answered. The benefits, risks, alternatives and complications were also discussed. The patient understands and wishes to proceed with the procedure. Written consent was obtained. Ultrasound was performed to localize and mark an adequate pocket of fluid in the right chest. The area was then prepped and draped in the normal sterile fashion. 1% Lidocaine was used for local anesthesia. Under ultrasound guidance a 6 Fr Safe-T-Centesis catheter was introduced. Thoracentesis was performed. The catheter was removed and a dressing applied. FINDINGS: A total of approximately 920 cc of yellow fluid was removed. Samples were sent to the laboratory as requested by the clinical team. IMPRESSION: Successful ultrasound guided diagnostic and therapeutic right thoracentesis yielding 920 cc of pleural fluid. Read by: Rowe Robert, PA-C Electronically Signed   By: Jerilynn Mages.  Shick M.D.   On: 01/02/2019 12:02    Scheduled Meds:  sodium chloride   Intravenous Once   aspirin EC  81 mg Oral Daily   atorvastatin  40 mg Oral q1800   Chlorhexidine Gluconate Cloth  6 each Topical Daily   docusate sodium  100 mg Oral BID   DULoxetine  60 mg Oral Daily   feeding supplement (ENSURE ENLIVE)  237 mL Oral BID BM   heparin  5,000 Units Subcutaneous Q8H   insulin aspart  0-9 Units Subcutaneous Q4H   insulin glargine  50 Units Subcutaneous QAC breakfast   lactulose  20 g Oral NOW   levothyroxine  150 mcg Oral QAC breakfast    metoprolol succinate  25 mg Oral QPM   metoprolol succinate  50 mg Oral Daily   pantoprazole  40 mg Oral Daily   polyethylene glycol  17 g Oral Daily   senna-docusate  2 tablet Oral QHS   Continuous Infusions:  lactated ringers 75 mL/hr at 01/03/19 0846   piperacillin-tazobactam (ZOSYN)  IV 3.375 g (01/02/19 2124)     LOS: 1 day   Domenic Polite, MD [facility]  01/03/2019, 12:48 PM

## 2019-01-03 NOTE — Progress Notes (Signed)
Hypoglycemic Event  CBG: 67  Treatment: 4 oz juice/soda  Symptoms: None  Follow-up CBG: [id number] CBG Result:71  Possible Reasons for Event: Inadequate meal intake  Comments/MD notified:Dr Broadus John made aware, resolved    Marcell Anger, Marveen Reeks

## 2019-01-03 NOTE — Progress Notes (Signed)
OT Cancellation Note  Patient Details Name: Marilyn [name] MRN: [id number] DOB: 1966/03/02   Cancelled Treatment:    Reason Eval/Treat Not Completed: Pain limiting ability to participate.  Spoke to MD as she was exiting room; she stated pt would not be able to participate in therapy at this time due to pain.  [place] 01/03/2019, 10:23 AM  Lesle Chris, OTR/L Acute Rehabilitation Services [phone] [facility] pager [phone] office 01/03/2019

## 2019-01-03 NOTE — Consult Note (Signed)
Marilyn [name] [birth year]  [id number].    Requesting MD: Dr. Domenic Polite Chief Complaint/Reason for Consult: chronic cholecystitis  HPI:  This is a 53 yo morbidly obese black female with a history of metastatic lung cancer who is currently undergoing chemotherapy and followed by Dr. Irene Limbo.  Her last dose of chemo was on 9/23 with Avastin and Alimta.  She has chronic SOB and malignant right pleural effusion which has had 3 thoracenteses so far.  She just recently had her fourth yesterday.    She states that on Friday she developed some RUQ abdominal pain.  The patient is a poor historian and difficult to obtain a good detailed history from.  She mostly lays on her side and gives minimal answers.  She is unable to tell me if anything aggravates or alleviates her symptoms.  She can't tell if this is related to her diet.  She admits to nausea and dry heaving yesterday.  She presented to the [facility] on Monday and was admitted.  She had had multiple imaging done including CT A/P, [place] abd, HIDA, and MRCP.  All of these show possible thickened gallbladder wall with gallstones, but no evidence for acute cholecystitis.  Her gallbladder is contracted on her imaging as well which is c/w a non-infected gallbladder.  She was placed on zosyn upon arrival.  Her WBC is 1.6.  She is AF.  She states she continues to have pain.  We have been asked to see her for further recommendations.    ROS: ROS: Please see HPI, otherwise all other systems reviewed and are negative currently.  Family History  Problem Relation Age of Onset   Heart failure Mother    Hypertension Mother    Cancer Neg Hx    Rheumatologic disease Neg Hx     Past Medical History:  Diagnosis Date   Arthritis    Asthma    Diabetes mellitus    Type II   Hemorrhoids    Hypercholesteremia    Hypertension    Hypothyroidism    Metastatic lung cancer (metastasis from lung to other site) (HCC)    Lung, Mets to skin- right flank. 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 mild - EF 45-50% by echo 10/2016 but EF 52% by low risk nuc 11/2016. b. Echo 07/2017 - EF normal, mild AI, unremarkable echo otherwise.   Obese    Pneumonia 2017   Sinus tachycardia    a. dates back to at least 2013, etiology unclear.    Past Surgical History:  Procedure Laterality Date   CESAREAN SECTION     COLONOSCOPY     I/D  arm Right 2013   IR FLUORO GUIDE PORT INSERTION RIGHT  02/18/2017   IR GENERIC HISTORICAL  01/08/2016   IR US GUIDE VASC ACCESS RIGHT 01/08/2016 [name], DO [facility]   IR GENERIC HISTORICAL  01/08/2016   IR FLUORO GUIDE CV LINE RIGHT 01/08/2016 [name], DO [facility]   IR THORACENTESIS ASP PLEURAL SPACE W/IMG GUIDE  05/25/2018   IR US GUIDE VASC ACCESS RIGHT  02/18/2017   MULTIPLE EXTRACTIONS WITH ALVEOLOPLASTY N/A 03/25/2017   Procedure: MULTIPLE EXTRACTION WITH ALVEOLOPLASTY (teeth #s four, six, seven, eight, nine, ten, eleven, one, twenty-three, twenty-four, twenty-five, twenty-six, twenty-nine, thirty);  Surgeon: [name], [name];  Location: [facility];  Service: Oral Surgery;  Laterality: N/A;   TUBAL LIGATION     US guided core needle biopsy     of right lower neck/supraclavicular lymph nodes    Social History:  reports that she has never smoked. She has never used smokeless tobacco. She reports that she does not drink alcohol or use drugs.  Allergies: No Known Allergies  Medications Prior to Admission  Medication Sig Dispense Refill   ASPIRIN LOW DOSE 81 MG EC tablet TAKE 1 TABLET BY MOUTH EVERY DAY (Patient taking differently: Take 81 mg by mouth daily. Enteric Coated.  ** DO NOT CRUSH **) 60 tablet 2   atorvastatin (LIPITOR) 40 MG tablet Take 1 tablet (40 mg total) by mouth daily at 6 PM. 30 tablet 0   dexamethasone (DECADRON) 4 MG tablet Take 1  tab two times a day the day before Alimta chemo. Take 2 tabs two times a day starting the day after chemo for 3 days. (Patient taking differently: Take 4-8 mg by mouth See admin instructions. Take 4 mg twice daily the day before Alimta chemo, then take 8 mg twice daily starting the day after chemo for 3 days.) 30 tablet 1   diclofenac sodium (VOLTAREN) 1 % GEL Apply 2 g topically 2 (two) times daily as needed (for chestwall pain from costochondiritis). 100 g 1   DULoxetine (CYMBALTA) 60 MG capsule TAKE 1 CAPSULE BY MOUTH EVERY DAY (Patient taking differently: Take 60 mg by mouth daily. ) 30 capsule 2   feeding supplement, ENSURE ENLIVE, (ENSURE ENLIVE) LIQD Take 237 mLs by mouth 2 (two) times daily between meals. 2947 mL 0   folic acid (FOLVITE) 1 MG tablet Take 1 tablet (1 mg total) by mouth daily. Start 5-7 days before Alimta chemotherapy. Continue until 21 days after Alimta completed. 100 tablet 3   hydrOXYzine (ATARAX/VISTARIL) 10 MG tablet Take 1 tablet (10 mg total) by mouth 3 (three) times daily as needed for itching. 30 tablet 0   insulin aspart (NOVOLOG) 100 UNIT/ML FlexPen As per sliding scale instructions (Patient taking differently: Inject 0-10 Units into the skin 3 (three) times daily. As per sliding scale instructions) 15 mL 2   Insulin Degludec (TRESIBA FLEXTOUCH) 200 UNIT/ML SOPN Inject 100 Units into the skin daily before breakfast.      levalbuterol (XOPENEX HFA) 45 MCG/ACT inhaler Inhale 1-2 puffs into the lungs every 8 (eight) hours as needed for wheezing. 1 Inhaler 12   levothyroxine (SYNTHROID) 150 MCG tablet TAKE 1 TABLET BY MOUTH EVERY DAY BEFORE BREAKFAST (Patient taking differently: Take 150 mcg by mouth daily before breakfast. ) 30 tablet 2   lidocaine-prilocaine (EMLA) cream Apply 1 application topically as needed. (Patient taking differently: Apply 1 application topically as needed (port access). ) 30 g 6   LORazepam (ATIVAN) 0.5 MG tablet Take 1 tablet (0.5 mg  total) by mouth every 6 (six) hours as needed (Nausea or vomiting). 30 tablet 0   magnesium oxide (MAG-OX) 400 (241.3 Mg) MG tablet Take 1 tablet (400 mg total) by mouth 2 (two) times daily. 60 tablet 2   metoprolol succinate (TOPROL-XL) 50 MG 24 hr tablet Take 1 tablet (48m) by mouth in the morning and 1/2 tablet (253m in the evening. Take with or immediately following a meal. (Patient taking differently: Take 25-50 mg by mouth 2 (two) times daily. Take 5041my mouth in the morning and 19m33m the evening. Take with or immediately following a meal.) 135 tablet 3   ondansetron (ZOFRAN)  8 MG tablet Take 1 tablet (8 mg total) by mouth 2 (two) times daily as needed for refractory nausea / vomiting. Start on day 3 after chemo. 30 tablet 1   oxyCODONE (OXY IR/ROXICODONE) 5 MG immediate release tablet Take 1 tablet (5 mg total) by mouth every 4 (four) hours as needed for severe pain. 15 tablet 0   pantoprazole (PROTONIX) 40 MG tablet Take 1 tablet (40 mg total) by mouth daily. 30 tablet 2   senna-docusate (SENNA S) 8.6-50 MG tablet Take 2 tablets by mouth at bedtime. 60 tablet 1   Vitamins A & D (VITAMIN A & D) OINT Apply 1 application topically 2 (two) times daily. (Patient taking differently: Apply 1 application topically 2 (two) times daily as needed (itching). ) 1 Tube 3   ACCU-CHEK AVIVA PLUS test strip 1 each by Other route 3 (three) times daily.      glucose monitoring kit (FREESTYLE) monitoring kit 1 each by Does not apply route as needed for other. 1 each 0     Physical Exam: Blood pressure 132/85, pulse 98, temperature 97.6 F (36.4 C), temperature source Oral, resp. rate 20, height _0  (1.753 m), weight 119.7 kg, last menstrual period 01/21/2016, SpO2 92 %. General: morbidly obese black female who is laying in bed in NAD HEENT: head is normocephalic, atraumatic.  Sclera are noninjected.  PERRL.  Ears and nose without any masses or lesions.  Mouth is pink and moist Heart: regular,  rate, and rhythm.  Normal s1,s2. No obvious murmurs, gallops, or rubs noted.  Palpable radial and pedal pulses bilaterally Lungs: CTAB, no wheezes, rhonchi, or rales noted.  Respiratory effort nonlabored Abd: soft, NT with palpation with stethoscope, but mildly tender in the RUQ with palpation with my hand, ND/obese, +BS, no masses, hernias, or organomegaly MS: all 4 extremities are symmetrical with no cyanosis, clubbing, or edema. Skin: warm and dry with no masses, lesions, or rashes Psych: A&Ox3 with an appropriate affect.   Results for orders placed or performed during the hospital encounter of 01/01/19 (from the past 48 hour(s))  CBG monitoring, ED     Status: Abnormal   Collection Time: 01/01/19  4:56 PM  Result Value Ref Range   Glucose-Capillary 151 (H) 70 - 99 mg/dL  Lactic acid, plasma     Status: None   Collection Time: 01/01/19  4:59 PM  Result Value Ref Range   Lactic Acid, Venous 1.0 0.5 - 1.9 mmol/L    Comment: Performed at [facility], [place] [address] [place], [place] [address]  CBC with Differential     Status: Abnormal   Collection Time: 01/01/19  6:38 PM  Result Value Ref Range   WBC 3.5 (L) 4.0 - 10.5 K/uL   RBC 2.02 (L) 3.87 - 5.11 MIL/uL   Hemoglobin 6.8 (LL) 12.0 - 15.0 g/dL    Comment: This critical result has verified and been called to DUNN,T. RN by Sarita Bottom on 09 28 2020 at [place], and has been read back. CRITICAL RESULT VERIFIED   HCT 21.5 (L) 36.0 - 46.0 %   MCV 106.4 (H) 80.0 - 100.0 fL   MCH 33.7 26.0 - 34.0 pg   MCHC 31.6 30.0 - 36.0 g/dL   RDW 20.3 (H) 11.5 - 15.5 %   Platelets 183 150 - 400 K/uL   nRBC 0.0 0.0 - 0.2 %   Neutrophils Relative % 81 %   Neutro Abs 2.9 1.7 - 7.7 K/uL   Lymphocytes  Relative 16 %   Lymphs Abs 0.6 (L) 0.7 - 4.0 K/uL   Monocytes Relative 3 %   Monocytes Absolute 0.1 0.1 - 1.0 K/uL   Eosinophils Relative 0 %   Eosinophils Absolute 0.0 0.0 - 0.5 K/uL   Basophils Relative 0 %   Basophils Absolute  0.0 0.0 - 0.1 K/uL   Immature Granulocytes 0 %   Abs Immature Granulocytes 0.01 0.00 - 0.07 K/uL    Comment: Performed at [facility], [place] [address] [place], [place] [address]  Comprehensive metabolic panel     Status: Abnormal   Collection Time: 01/01/19  6:38 PM  Result Value Ref Range   Sodium 137 135 - 145 mmol/L   Potassium 3.5 3.5 - 5.1 mmol/L   Chloride 105 98 - 111 mmol/L   CO2 21 (L) 22 - 32 mmol/L   Glucose, Bld 159 (H) 70 - 99 mg/dL   BUN 6 6 - 20 mg/dL   Creatinine, Ser 0.82 0.44 - 1.00 mg/dL   Calcium 7.5 (L) 8.9 - 10.3 mg/dL   Total Protein 6.9 6.5 - 8.1 g/dL   Albumin 2.7 (L) 3.5 - 5.0 g/dL   AST 25 15 - 41 U/L   ALT 16 0 - 44 U/L   Alkaline Phosphatase 80 38 - 126 U/L   Total Bilirubin 0.6 0.3 - 1.2 mg/dL   GFR calc non Af Amer >60 >60 mL/min   GFR calc Af Amer >60 >60 mL/min   Anion gap 11 5 - 15    Comment: Performed at [facility], [place] [address], [place], [place] [address]  Lactic acid, plasma     Status: None   Collection Time: 01/01/19  6:38 PM  Result Value Ref Range   Lactic Acid, Venous 1.0 0.5 - 1.9 mmol/L    Comment: Performed at [facility], [place] [address], [place], [place] [address]  Protime-INR     Status: None   Collection Time: 01/01/19  6:38 PM  Result Value Ref Range   Prothrombin Time 14.5 11.4 - 15.2 seconds   INR 1.1 0.8 - 1.2    Comment: (NOTE) INR goal varies based on device and disease states. Performed at [facility], [place] [address], [place], [place] [address]   APTT     Status: None   Collection Time: 01/01/19  6:38 PM  Result Value Ref Range   aPTT 28 24 - 36 seconds    Comment: Performed at [facility], [place] [address], [place], [place] [address]  Procalcitonin     Status: None   Collection Time: 01/01/19  6:38 PM  Result Value Ref Range   Procalcitonin 0.15 ng/mL    Comment:        Interpretation: PCT (Procalcitonin) <=  0.5 ng/mL: Systemic infection (sepsis) is not likely. Local bacterial infection is possible. (NOTE)       Sepsis PCT Algorithm           Lower Respiratory Tract                                      Infection PCT Algorithm    ----------------------------     ----------------------------         PCT < 0.25 ng/mL                PCT < 0.10 ng/mL  Strongly encourage             Strongly discourage   discontinuation of antibiotics    initiation of antibiotics    ----------------------------     -----------------------------       PCT 0.25 - 0.50 ng/mL            PCT 0.10 - 0.25 ng/mL               OR       >80% decrease in PCT            Discourage initiation of                                            antibiotics      Encourage discontinuation           of antibiotics    ----------------------------     -----------------------------         PCT >= 0.50 ng/mL              PCT 0.26 - 0.50 ng/mL               AND        <80% decrease in PCT             Encourage initiation of                                             antibiotics       Encourage continuation           of antibiotics    ----------------------------     -----------------------------        PCT >= 0.50 ng/mL                  PCT > 0.50 ng/mL               AND         increase in PCT                  Strongly encourage                                      initiation of antibiotics    Strongly encourage escalation           of antibiotics                                     -----------------------------                                           PCT <= 0.25 ng/mL                                                 OR                                        >  80% decrease in PCT                                     Discontinue / Do not initiate                                             antibiotics Performed at [place] [address] [place], [place] [address]   Type and screen [place]     Status: None   Collection Time: 01/01/19  6:38 PM  Result Value Ref Range   ABO/RH(D) A POS    Antibody Screen NEG    Sample Expiration 01/04/2019,2359    Unit Number [id number]    Blood Component Type RED CELLS,LR    Unit division 00    Status of Unit ISSUED,FINAL    Transfusion Status OK TO TRANSFUSE    Crossmatch Result      Compatible Performed at [facility], [place] [address] [place] [address], [place], [place] [address]   Vitamin B12     Status: Abnormal   Collection Time: 01/01/19  6:38 PM  Result Value Ref Range   Vitamin B-12 2,017 (H) 180 - 914 pg/mL    Comment: RESULTS CONFIRMED BY MANUAL DILUTION (NOTE) This assay is not validated for testing neonatal or myeloproliferative syndrome specimens for Vitamin B12 levels. Performed at [facility], [place] [address] [place], [place] [address]   TSH     Status: Abnormal   Collection Time: 01/01/19  6:38 PM  Result Value Ref Range   TSH 41.681 (H) 0.350 - 4.500 uIU/mL    Comment: Performed by a 3rd Generation assay with a functional sensitivity of <=0.01 uIU/mL. Performed at [facility], [place] [address], [place], [place] [address]   T4, free     Status: Abnormal   Collection Time: 01/01/19  6:38 PM  Result Value Ref Range   Free T4 0.55 (L) 0.61 - 1.12 ng/dL    Comment: (NOTE) Biotin ingestion may interfere with free T4 tests. If the results are inconsistent with the TSH level, previous test results, or the clinical presentation, then consider biotin interference. If needed, order repeat testing after stopping biotin. Performed at [facility], [place] [address] [place], [place] [address]   ABO/Rh     Status: None   Collection Time: 01/01/19  6:39 PM  Result Value Ref Range   ABO/RH(D)      A POS Performed at [facility], [place] [address], [place], [place] [address]   Culture, blood (x 2)     Status: None (Preliminary result)    Collection Time: 01/01/19  9:03 PM   Specimen: BLOOD  Result Value Ref Range   Specimen Description      BLOOD LEFT ANTECUBITAL Performed at [place] [address] [place] [address], [place], [place] [address]    Special Requests      BOTTLES DRAWN AEROBIC AND ANAEROBIC Blood Culture adequate volume Performed at [place] [address], [place], [place] [address]    Culture      NO GROWTH 1 DAY Performed at [place] [address] [place], [place] [address]    Report Status PENDING   Culture, blood (x 2)  Status: None (Preliminary result)   Collection Time: 01/01/19  9:03 PM   Specimen: BLOOD  Result Value Ref Range   Specimen Description      BLOOD RIGHT ANTECUBITAL Performed at [place] [address], [place], [place] [address]    Special Requests      BOTTLES DRAWN AEROBIC AND ANAEROBIC Blood Culture adequate volume Performed at [facility] [place] [address], [name], [place] [address]    Culture      NO GROWTH 1 DAY Performed at [place] [address] [place], [place] [address]    Report Status PENDING   Glucose, capillary     Status: Abnormal   Collection Time: 01/01/19  9:50 PM  Result Value Ref Range   Glucose-Capillary 144 (H) 70 - 99 mg/dL   Comment 1 Notify RN    Comment 2 Document in Chart   Glucose, capillary     Status: Abnormal   Collection Time: 01/01/19 11:37 PM  Result Value Ref Range   Glucose-Capillary 134 (H) 70 - 99 mg/dL   Comment 1 Notify RN    Comment 2 Document in Chart   Comprehensive metabolic panel     Status: Abnormal   Collection Time: 01/02/19  3:22 AM  Result Value Ref Range   Sodium 139 135 - 145 mmol/L   Potassium 3.6 3.5 - 5.1 mmol/L   Chloride 106 98 - 111 mmol/L   CO2 24 22 - 32 mmol/L   Glucose, Bld 156 (H) 70 - 99 mg/dL   BUN 7 6 - 20 mg/dL   Creatinine, Ser 0.79 0.44 - 1.00 mg/dL   Calcium 7.7 (L) 8.9 - 10.3 mg/dL   Total  Protein 7.1 6.5 - 8.1 g/dL   Albumin 2.7 (L) 3.5 - 5.0 g/dL   AST 24 15 - 41 U/L   ALT 14 0 - 44 U/L   Alkaline Phosphatase 87 38 - 126 U/L   Total Bilirubin 0.7 0.3 - 1.2 mg/dL   GFR calc non Af Amer >60 >60 mL/min   GFR calc Af Amer >60 >60 mL/min   Anion gap 9 5 - 15    Comment: Performed at [facility], [place] [address], [place], [place] [address]  CBC     Status: Abnormal   Collection Time: 01/02/19  3:22 AM  Result Value Ref Range   WBC 3.1 (L) 4.0 - 10.5 K/uL   RBC 1.98 (L) 3.87 - 5.11 MIL/uL   Hemoglobin 6.9 (LL) 12.0 - 15.0 g/dL    Comment: REPEATED TO VERIFY CRITICAL VALUE NOTED.  VALUE IS CONSISTENT WITH PREVIOUSLY REPORTED AND CALLED VALUE.    HCT 21.0 (L) 36.0 - 46.0 %   MCV 106.1 (H) 80.0 - 100.0 fL   MCH 34.8 (H) 26.0 - 34.0 pg   MCHC 32.9 30.0 - 36.0 g/dL   RDW 20.6 (H) 11.5 - 15.5 %   Platelets 183 150 - 400 K/uL   nRBC 0.0 0.0 - 0.2 %    Comment: Performed at [facility], [place] [address] [place] [address], [place], [place] [address]  Glucose, capillary     Status: Abnormal   Collection Time: 01/02/19  3:50 AM  Result Value Ref Range   Glucose-Capillary 149 (H) 70 - 99 mg/dL   Comment 1 Notify RN    Comment 2 Document in Chart   Glucose, capillary     Status: Abnormal   Collection Time: 01/02/19  7:47 AM  Result Value Ref Range   Glucose-Capillary 139 (H) 70 - 99 mg/dL  Prepare RBC     Status: None   Collection Time: 01/02/19  8:52 AM  Result Value Ref Range   Order Confirmation      ORDER PROCESSED BY BLOOD BANK Performed at [facility], [place] [address] [place], [place] [address]   Lactate dehydrogenase (pleural or peritoneal fluid)     Status: Abnormal   Collection Time: 01/02/19 12:40 PM  Result Value Ref Range   LD, Fluid 322 (H) 3 - 23 U/L    Comment: (NOTE) Results should be evaluated in conjunction with serum values    Fluid Type-FLDH PLEURAL     Comment: LT Performed at [facility] [address], [place], [place] [address] CORRECTED ON 09/29 AT 1307: PREVIOUSLY REPORTED AS CYTO PLEU   Body fluid cell count with differential     Status: Abnormal   Collection Time: 01/02/19 12:40 PM  Result Value Ref Range   Fluid Type-FCT PLEURAL     Comment: LT CORRECTED ON 09/29 AT 1307: PREVIOUSLY REPORTED AS CYTO PLEU    Color, Fluid YELLOW YELLOW   Appearance, Fluid HAZY (A) CLEAR   Total Nucleated Cell Count, Fluid 715 0 - 1,000 cu mm   Neutrophil Count, Fluid 81 (H) 0 - 25 %   Lymphs, Fluid 11 %   Monocyte-Macrophage-Serous Fluid 8 (L) 50 - 90 %   Eos, Fluid 0 %   Other Cells, Fluid OTHER CELLS IDENTIFIED AS MESOTHELIAL CELLS %    Comment: CORRELATE WITH CYTOLOGY. Performed at [facility], [place] [address], [place], [place] [address]   Protein, pleural or peritoneal fluid     Status: None   Collection Time: 01/02/19 12:40 PM  Result Value Ref Range   Total protein, fluid 4.8 g/dL    Comment: (NOTE) No normal range established for this test Results should be evaluated in conjunction with serum values    Fluid Type-FTP PLEURAL     Comment: LT Performed at [facility] [address], [place], [place] [address] CORRECTED ON 09/29 AT 1307: PREVIOUSLY REPORTED AS CYTO PLEU   Culture, body fluid-bottle     Status: None (Preliminary result)   Collection Time: 01/02/19 12:40 PM   Specimen: Pleura  Result Value Ref Range   Specimen Description PLEURAL LEFT    Special Requests NONE    Culture      NO GROWTH < 24 HOURS Performed at [facility], [place] [address], [place], [place] [address]    Report Status PENDING   Gram stain     Status: None   Collection Time: 01/02/19 12:40 PM   Specimen: Pleura  Result Value Ref Range   Specimen Description PLEURAL LEFT    Special Requests NONE    Gram Stain      MODERATE WBC PRESENT,BOTH PMN AND MONONUCLEAR NO ORGANISMS SEEN Performed at [facility], [place] [address]  [place], [place] [address]    Report Status 01/02/2019 FINAL   Glucose, capillary     Status: Abnormal   Collection Time: 01/02/19 12:47 PM  Result Value Ref Range   Glucose-Capillary 136 (H) 70 - 99 mg/dL  Glucose, capillary     Status: Abnormal   Collection Time: 01/02/19  5:47 PM  Result Value Ref Range   Glucose-Capillary 126 (H) 70 - 99 mg/dL  Hemoglobin and hematocrit, blood     Status: Abnormal  Collection Time: 01/02/19  7:37 PM  Result Value Ref Range   Hemoglobin 8.5 (L) 12.0 - 15.0 g/dL   HCT 25.6 (L) 36.0 - 46.0 %    Comment: Performed at [facility], [place] [address] [place], [place] [address]  Glucose, capillary     Status: Abnormal   Collection Time: 01/02/19  8:18 PM  Result Value Ref Range   Glucose-Capillary 127 (H) 70 - 99 mg/dL  Glucose, capillary     Status: Abnormal   Collection Time: 01/03/19 12:13 AM  Result Value Ref Range   Glucose-Capillary 135 (H) 70 - 99 mg/dL  Glucose, capillary     Status: Abnormal   Collection Time: 01/03/19  4:08 AM  Result Value Ref Range   Glucose-Capillary 146 (H) 70 - 99 mg/dL  Comprehensive metabolic panel     Status: Abnormal   Collection Time: 01/03/19  4:37 AM  Result Value Ref Range   Sodium 138 135 - 145 mmol/L   Potassium 3.9 3.5 - 5.1 mmol/L   Chloride 105 98 - 111 mmol/L   CO2 25 22 - 32 mmol/L   Glucose, Bld 162 (H) 70 - 99 mg/dL   BUN 12 6 - 20 mg/dL   Creatinine, Ser 0.87 0.44 - 1.00 mg/dL   Calcium 7.6 (L) 8.9 - 10.3 mg/dL   Total Protein 7.5 6.5 - 8.1 g/dL   Albumin 2.8 (L) 3.5 - 5.0 g/dL   AST 28 15 - 41 U/L   ALT 15 0 - 44 U/L   Alkaline Phosphatase 102 38 - 126 U/L   Total Bilirubin 0.7 0.3 - 1.2 mg/dL   GFR calc non Af Amer >60 >60 mL/min   GFR calc Af Amer >60 >60 mL/min   Anion gap 8 5 - 15    Comment: Performed at [facility] [place] [address], [place], [place] [address]  CBC     Status: Abnormal   Collection Time: 01/03/19  4:37 AM  Result Value Ref Range    WBC 1.6 (L) 4.0 - 10.5 K/uL   RBC 2.60 (L) 3.87 - 5.11 MIL/uL   Hemoglobin 8.8 (L) 12.0 - 15.0 g/dL   HCT 26.5 (L) 36.0 - 46.0 %   MCV 101.9 (H) 80.0 - 100.0 fL   MCH 33.8 26.0 - 34.0 pg   MCHC 33.2 30.0 - 36.0 g/dL   RDW 20.3 (H) 11.5 - 15.5 %   Platelets 154 150 - 400 K/uL   nRBC 0.0 0.0 - 0.2 %    Comment: Performed at [facility], [place] [address], [place], [place] [address]  Glucose, capillary     Status: Abnormal   Collection Time: 01/03/19  8:07 AM  Result Value Ref Range   Glucose-Capillary 118 (H) 70 - 99 mg/dL   Dg Chest 1 View  Result Date: 01/02/2019 CLINICAL DATA:  Post thoracentesis EXAM: CHEST  1 VIEW COMPARISON:  01/01/2019 FINDINGS: Status post right thoracentesis with decreasing right effusion. No pneumothorax. Medial right upper lobe suprahilar mass again noted, unchanged. Right Port-A-Cath is unchanged. Left lung clear. Heart is normal size. IMPRESSION: Decreasing right effusion following thoracentesis. No pneumothorax. Otherwise no change. Electronically Signed   By: Rolm Baptise M.D.   On: 01/02/2019 12:16   Nm Hepatobiliary Liver Func  Result Date: 01/01/2019 CLINICAL DATA:  Cholelithiasis, RIGHT upper quadrant pain EXAM: NUCLEAR MEDICINE HEPATOBILIARY IMAGING TECHNIQUE: Sequential images of the abdomen were obtained out to 60 minutes following intravenous administration of radiopharmaceutical.  RADIOPHARMACEUTICALS:  5.5 mCi Tc-26m Choletec IV COMPARISON:  None Correlation: Ultrasound abdomen 01/01/2019, CT abdomen and pelvis 01/01/2019 FINDINGS: Prompt tracer extraction from bloodstream indicating normal hepatocellular function. Prompt excretion of tracer into biliary tree. Small bowel visualized at 11 minutes. At 1 hour gallbladder had not visualized. Patient then received 4 mg of morphine sulfate IV and imaging was continued for 30 minutes. Faint visualization of the gallbladder following morphine administration. Gallbladder was contracted on prior  CT and ultrasound exams exam with mild wall thickening. Delayed visualization of the gallbladder may be seen in patients with chronic cholecystitis. IMPRESSION: Delayed visualization of the gallbladder, seen only following morphine augmentation, raising question of chronic cholecystitis. Gallbladder was contracted with a thickened wall on prior CT and ultrasound exams. Patent CBD. Electronically Signed   By: MLavonia DanaM.D.   On: 01/01/2019 16:18   Mr Abdomen Mrcp Wo Contrast  Result Date: 01/01/2019 CLINICAL DATA:  Abdominal pain for 3 months. Nausea. Constipation. Shortness of breath. Stage IV lung cancer. Gallstones. EXAM: MRI ABDOMEN WITHOUT CONTRAST  (INCLUDING MRCP) TECHNIQUE: Multiplanar multisequence MR imaging of the abdomen was performed. Heavily T2-weighted images of the biliary and pancreatic ducts were obtained, and three-dimensional MRCP images were rendered by post processing. COMPARISON:  01/01/2019 abdominal ultrasound. Abdominopelvic CT of 01/01/2019. FINDINGS: Mild limitations secondary to patient body habitus. Lower chest: Moderate right pleural effusion.  Normal heart size. Hepatobiliary: Normal noncontrast appearance of the liver. There is borderline to minimal intrahepatic biliary duct dilatation, including on 22/3. The gallbladder is stone filled, with stones up to 1.8 cm. The common duct is dilated, including at maximally 1.5 cm on 83/5. Tapers gradually in the region of the pancreatic head. No obstructive stone or mass identified. Pancreas:  Normal, without mass or ductal dilatation. Spleen:  Normal in size, without focal abnormality. Adrenals/Urinary Tract: Normal adrenal glands. Normal kidneys, without hydronephrosis. Stomach/Bowel:  Normal stomach and abdominal bowel loops. Vascular/Lymphatic:  Normal aortic caliber. Prominent abdominal nodes within the retroperitoneum. None are pathologic by size criteria. Retrocrural adenopathy, including at 1.1 cm on 20/3, suspicious. Other:  No  ascites. Musculoskeletal: No acute osseous abnormality. IMPRESSION: 1. Patient body habitus degradation. 2. Cholelithiasis with common duct dilatation. No cause identified. Most likely within normal variation, given today's normal bilirubin level. 3. Retrocrural adenopathy, suspicious for nodal metastasis in this patient with a history of lung cancer. Electronically Signed   By: KAbigail MiyamotoM.D.   On: 01/01/2019 20:41   Mr 3d Recon At Scanner  Result Date: 01/01/2019 CLINICAL DATA:  Abdominal pain for 3 months. Nausea. Constipation. Shortness of breath. Stage IV lung cancer. Gallstones. EXAM: MRI ABDOMEN WITHOUT CONTRAST  (INCLUDING MRCP) TECHNIQUE: Multiplanar multisequence MR imaging of the abdomen was performed. Heavily T2-weighted images of the biliary and pancreatic ducts were obtained, and three-dimensional MRCP images were rendered by post processing. COMPARISON:  01/01/2019 abdominal ultrasound. Abdominopelvic CT of 01/01/2019. FINDINGS: Mild limitations secondary to patient body habitus. Lower chest: Moderate right pleural effusion.  Normal heart size. Hepatobiliary: Normal noncontrast appearance of the liver. There is borderline to minimal intrahepatic biliary duct dilatation, including on 22/3. The gallbladder is stone filled, with stones up to 1.8 cm. The common duct is dilated, including at maximally 1.5 cm on 83/5. Tapers gradually in the region of the pancreatic head. No obstructive stone or mass identified. Pancreas:  Normal, without mass or ductal dilatation. Spleen:  Normal in size, without focal abnormality. Adrenals/Urinary Tract: Normal adrenal glands. Normal kidneys, without hydronephrosis. Stomach/Bowel:  Normal stomach and abdominal bowel loops. Vascular/Lymphatic:  Normal aortic caliber. Prominent abdominal nodes within the retroperitoneum. None are pathologic by size criteria. Retrocrural adenopathy, including at 1.1 cm on 20/3, suspicious. Other:  No ascites. Musculoskeletal: No acute  osseous abnormality. IMPRESSION: 1. Patient body habitus degradation. 2. Cholelithiasis with common duct dilatation. No cause identified. Most likely within normal variation, given today's normal bilirubin level. 3. Retrocrural adenopathy, suspicious for nodal metastasis in this patient with a history of lung cancer. Electronically Signed   By: Abigail Miyamoto M.D.   On: 01/01/2019 20:41   US Thoracentesis Asp Pleural Space W/img Guide  Result Date: 01/02/2019 INDICATION: Patient with history of lung cancer with recurrent malignant right pleural effusion; request made for diagnostic and therapeutic right thoracentesis. EXAM: ULTRASOUND GUIDED DIAGNOSTIC AND THERAPEUTIC RIGHT THORACENTESIS MEDICATIONS: None COMPLICATIONS: None immediate. PROCEDURE: An ultrasound guided thoracentesis was thoroughly discussed with the patient and questions answered. The benefits, risks, alternatives and complications were also discussed. The patient understands and wishes to proceed with the procedure. Written consent was obtained. Ultrasound was performed to localize and mark an adequate pocket of fluid in the right chest. The area was then prepped and draped in the normal sterile fashion. 1% Lidocaine was used for local anesthesia. Under ultrasound guidance a 6 Fr Safe-T-Centesis catheter was introduced. Thoracentesis was performed. The catheter was removed and a dressing applied. FINDINGS: A total of approximately 920 cc of yellow fluid was removed. Samples were sent to the laboratory as requested by the clinical team. IMPRESSION: Successful ultrasound guided diagnostic and therapeutic right thoracentesis yielding 920 cc of pleural fluid. Read by: Rowe Robert, PA-C Electronically Signed   By: Jerilynn Mages.  Shick M.D.   On: 01/02/2019 12:02      Assessment/Plan Metastatic lung cancer - last dose of chemo on 9/23 Nonischemic cardiomyopathy HTN Malignant pleural effusion leukopenia Anemia of chronic disease  Abdominal pain,  cholelithiasis The patient does not have evidence of acute cholecystitis.  She may have some chronic cholecystitis based off of her HIDA scan; however, given her chemo status as well as her obesity, leukopenia, anemia, etc, she is very high risk for surgical intervention at this time given no acute infection.  Her gallbladder appears very under whelming on her imaging and it is difficult to definitively say this is the source of her current symptoms.  She could be having nausea from her chemo and pain from her disease as well as pleuritic pain from her effusion. (although pain still present after tap yesterday)  She is not a candidate for a perc chole drain given no evidence for acute cholecystitis as well as a contracted gallbladder.  She does not need an operation in the setting of a non-acute gallbladder.  Symptomatic treatment would be recommended at this time.  She can follow up as an outpatient to discuss elective cholecystectomy in the future once she has completed her chemo etc as needed.  Can try liquids as able.  Henreitta Cea, [facility] Surgery 01/03/2019, 11:38 AM Pager: [phone]

## 2019-01-03 NOTE — Progress Notes (Signed)
PT Cancellation Note  Patient Details Name: Marilyn [name] MRN: [id number] DOB: Mar 19, 1966   Cancelled Treatment:    Reason Eval/Treat Not Completed: Pain limiting ability to participate. Will check back another time.   Claretha Cooper 01/03/2019, 11:00 AM  [place] Pager [phone] Office [phone]

## 2019-01-04 ENCOUNTER — Inpatient Hospital Stay (HOSPITAL_COMMUNITY): Payer: Medicare Other

## 2019-01-04 DIAGNOSIS — D701 Agranulocytosis secondary to cancer chemotherapy: Secondary | ICD-10-CM

## 2019-01-04 DIAGNOSIS — K8050 Calculus of bile duct without cholangitis or cholecystitis without obstruction: Secondary | ICD-10-CM

## 2019-01-04 DIAGNOSIS — T451X5A Adverse effect of antineoplastic and immunosuppressive drugs, initial encounter: Secondary | ICD-10-CM

## 2019-01-04 LAB — COMPREHENSIVE METABOLIC PANEL
ALT: 14 U/L (ref 0–44)
AST: 26 U/L (ref 15–41)
Albumin: 2.7 g/dL — ABNORMAL LOW (ref 3.5–5.0)
Alkaline Phosphatase: 113 U/L (ref 38–126)
Anion gap: 9 (ref 5–15)
BUN: 12 mg/dL (ref 6–20)
CO2: 26 mmol/L (ref 22–32)
Calcium: 7.8 mg/dL — ABNORMAL LOW (ref 8.9–10.3)
Chloride: 104 mmol/L (ref 98–111)
Creatinine, Ser: 0.82 mg/dL (ref 0.44–1.00)
GFR calc Af Amer: >60 mL/min (ref >60)
GFR calc non Af Amer: >60 mL/min (ref >60)
Glucose, Bld: 74 mg/dL (ref 70–99)
Potassium: 3.6 mmol/L (ref 3.5–5.1)
Sodium: 139 mmol/L (ref 135–145)
Total Bilirubin: 0.7 mg/dL (ref 0.3–1.2)
Total Protein: 7.3 g/dL (ref 6.5–8.1)

## 2019-01-04 LAB — GLUCOSE, CAPILLARY
Glucose-Capillary: 109 mg/dL — ABNORMAL HIGH (ref 70–99)
Glucose-Capillary: 128 mg/dL — ABNORMAL HIGH (ref 70–99)
Glucose-Capillary: 49 mg/dL — ABNORMAL LOW (ref 70–99)
Glucose-Capillary: 60 mg/dL — ABNORMAL LOW (ref 70–99)
Glucose-Capillary: 63 mg/dL — ABNORMAL LOW (ref 70–99)
Glucose-Capillary: 85 mg/dL (ref 70–99)
Glucose-Capillary: 90 mg/dL (ref 70–99)
Glucose-Capillary: 99 mg/dL (ref 70–99)

## 2019-01-04 LAB — CBC WITH DIFFERENTIAL/PLATELET
Abs Immature Granulocytes: 0.1 10*3/uL — ABNORMAL HIGH (ref 0.00–0.07)
Basophils Absolute: 0 10*3/uL (ref 0.0–0.1)
Basophils Relative: 1 %
Eosinophils Absolute: 0 10*3/uL (ref 0.0–0.5)
Eosinophils Relative: 1 %
HCT: 25.9 % — ABNORMAL LOW (ref 36.0–46.0)
Hemoglobin: 8.6 g/dL — ABNORMAL LOW (ref 12.0–15.0)
Immature Granulocytes: 13 %
Lymphocytes Relative: 42 %
Lymphs Abs: 0.3 10*3/uL — ABNORMAL LOW (ref 0.7–4.0)
MCH: 34 pg (ref 26.0–34.0)
MCHC: 33.2 g/dL (ref 30.0–36.0)
MCV: 102.4 fL — ABNORMAL HIGH (ref 80.0–100.0)
Monocytes Absolute: 0.1 10*3/uL (ref 0.1–1.0)
Monocytes Relative: 11 %
Neutro Abs: 0.2 10*3/uL — ABNORMAL LOW (ref 1.7–7.7)
Neutrophils Relative %: 32 %
Platelets: 130 10*3/uL — ABNORMAL LOW (ref 150–400)
RBC: 2.53 MIL/uL — ABNORMAL LOW (ref 3.87–5.11)
RDW: 20 % — ABNORMAL HIGH (ref 11.5–15.5)
WBC: 0.8 10*3/uL — CL (ref 4.0–10.5)
nRBC: 0 % (ref 0.0–0.2)

## 2019-01-04 LAB — MRSA PCR SCREENING: MRSA by PCR: POSITIVE — AB

## 2019-01-04 MED ORDER — IOHEXOL 300 MG/ML  SOLN
100.0000 mL | Freq: Once | INTRAMUSCULAR | Status: AC | PRN
  Administered 2019-01-04: 100 mL via INTRAVENOUS

## 2019-01-04 MED ORDER — SODIUM CHLORIDE 0.9 % IV SOLN
INTRAVENOUS | Status: DC | PRN
  Administered 2019-01-18: 1000 mL via INTRAVENOUS

## 2019-01-04 MED ORDER — IOHEXOL 300 MG/ML  SOLN: 30.0000 mL | Freq: Once | INTRAMUSCULAR | Status: DC | PRN

## 2019-01-04 MED ORDER — HYDRALAZINE HCL 20 MG/ML IJ SOLN
10.0000 mg | Freq: Four times a day (QID) | INTRAMUSCULAR | Status: DC | PRN
  Administered 2019-01-04 – 2019-01-16 (×14): 10 mg via INTRAVENOUS
  Filled 2019-01-04 (×14): qty 1

## 2019-01-04 MED ORDER — SODIUM CHLORIDE 0.9 % IV SOLN
INTRAVENOUS | Status: DC | PRN
  Administered 2019-01-04 – 2019-01-14 (×3): 250 mL via INTRAVENOUS
  Administered 2019-01-16: 10 mL/h via INTRAVENOUS
  Administered 2019-01-17: 250 mL via INTRAVENOUS

## 2019-01-04 MED ORDER — SODIUM CHLORIDE (PF) 0.9 % IJ SOLN
INTRAMUSCULAR | Status: AC
  Filled 2019-01-04: qty 50

## 2019-01-04 MED ORDER — MUPIROCIN 2 % EX OINT
1.0000 "application " | TOPICAL_OINTMENT | Freq: Two times a day (BID) | CUTANEOUS | Status: AC
  Administered 2019-01-05 – 2019-01-09 (×8): 1 via NASAL
  Filled 2019-01-04 (×2): qty 22

## 2019-01-04 MED ORDER — ORAL CARE MOUTH RINSE
15.0000 mL | Freq: Two times a day (BID) | OROMUCOSAL | Status: DC
  Administered 2019-01-05 – 2019-01-20 (×20): 15 mL via OROMUCOSAL

## 2019-01-04 MED ORDER — DEXTROSE 50 % IV SOLN
INTRAVENOUS | Status: AC
  Administered 2019-01-04: 25 mL
  Filled 2019-01-04: qty 50

## 2019-01-04 MED ORDER — HYDRALAZINE HCL 20 MG/ML IJ SOLN
INTRAMUSCULAR | Status: AC
  Filled 2019-01-04: qty 1

## 2019-01-04 MED ORDER — LABETALOL HCL 5 MG/ML IV SOLN
10.0000 mg | INTRAVENOUS | Status: DC | PRN
  Administered 2019-01-04 – 2019-01-08 (×3): 10 mg via INTRAVENOUS
  Filled 2019-01-04 (×3): qty 4

## 2019-01-04 MED ORDER — CHLORHEXIDINE GLUCONATE CLOTH 2 % EX PADS
6.0000 | MEDICATED_PAD | Freq: Every day | CUTANEOUS | Status: AC
  Administered 2019-01-05 – 2019-01-09 (×4): 6 via TOPICAL

## 2019-01-04 MED ORDER — INSULIN GLARGINE 100 UNIT/ML ~~LOC~~ SOLN
20.0000 [IU] | Freq: Every day | SUBCUTANEOUS | Status: DC
  Administered 2019-01-05 – 2019-01-20 (×14): 20 [IU] via SUBCUTANEOUS
  Filled 2019-01-04 (×18): qty 0.2

## 2019-01-04 MED ORDER — HYDRALAZINE HCL 25 MG PO TABS
25.0000 mg | ORAL_TABLET | Freq: Four times a day (QID) | ORAL | Status: DC
  Administered 2019-01-04 – 2019-01-06 (×8): 25 mg via ORAL
  Filled 2019-01-04 (×11): qty 1

## 2019-01-04 NOTE — Progress Notes (Signed)
CC:  Subjective: Called to evaluate pt by [facility] bc of concerns tachycardia, increased RR.  When I asked pt what is going on- she c/o SOB overnight and this am.  She initially states her abd pain is about same as yesterday - no worse or better She states drinking liquids didn't make it worse.   Objective: Vital signs in last 24 hours: Temp:  [97.4 F (36.3 C)-99.1 F (37.3 C)] 99.1 F (37.3 C) (10/01 0919) Pulse Rate:  [104-140] 137 (10/01 0919) Resp:  [17-45] 45 (10/01 0919) BP: (136-186)/(89-123) 145/106 (10/01 0919) SpO2:  [89 %-100 %] 97 % (10/01 0919) Last BM Date: 12/31/18 Nothing recorded for intake 850 urine recorded TM 99.1 Tachycardic HR up to 140 BP up 186/114, 145/106 sats 89 on RA at admit, now 97% on 2l/[place] CMP/LFT's unchanged WBC 3.8(9/28)>>3.1(9/29)>>1.6(9/30)>>0.8(10/1) CT chest- No PE/dissection/large right pleural effusion with compressive atelectasis essential the collapse of the right middle lobe and lower lobe consolidation medial aspect of the right upper lobe Right paratracheal and axillary lymphadenopathy Gallbladder thickening no gallstones in his uterus no bowel obstruction.  Right internal iliac chain lymphadenopathy stable and likely neoplastic. Abdominal ultrasound 9/28: Cholelithiasis/gallbladder wall thickening; gallbladder wall distention CBD 1.2 cm MRI: Cholelithiasis with common duct dilatation no cause identified most likely normal variation given normal bilirubin.  Retrocrural adenopathy suspicious for metastasis  920 cc thoracentesis 9/29 Intake/Output from previous day: 09/30 0701 - 10/01 0700 In: -  Out: 850 [Urine:850] Intake/Output this shift: No intake/output data recorded.  Severely obese Some SOB  Not taking deep breaths so auscul difficult Tachy Obese, soft, TTP in RUQ  Lab Results:  Recent Labs    01/03/19 0437 01/04/19 0314  WBC 1.6* 0.8*  HGB 8.8* 8.6*  HCT 26.5* 25.9*  PLT 154 130*    BMET Recent Labs     01/03/19 0437 01/04/19 0314  NA 138 139  K 3.9 3.6  CL 105 104  CO2 25 26  GLUCOSE 162* 74  BUN 12 12  CREATININE 0.87 0.82  CALCIUM 7.6* 7.8*   PT/INR Recent Labs    01/01/19 1838  LABPROT 14.5  INR 1.1    Recent Labs  Lab 01/01/19 0541 01/01/19 1838 01/02/19 0322 01/03/19 0437 01/04/19 0314  AST 27 25 24 28 26   ALT 17 16 14 15 14   ALKPHOS 84 80 87 102 113  BILITOT 0.5 0.6 0.7 0.7 0.7  PROT 7.3 6.9 7.1 7.5 7.3  ALBUMIN 2.8* 2.7* 2.7* 2.8* 2.7*     Lipase     Component Value Date/Time   LIPASE 14 01/01/2019 0541     Medications: . sodium chloride   Intravenous Once  . aspirin EC  81 mg Oral Daily  . atorvastatin  40 mg Oral q1800  . Chlorhexidine Gluconate Cloth  6 each Topical Daily  . dicyclomine  10 mg Oral BID AC  . docusate sodium  100 mg Oral BID  . DULoxetine  60 mg Oral Daily  . feeding supplement (ENSURE ENLIVE)  237 mL Oral BID BM  . feeding supplement (GLUCERNA SHAKE)  237 mL Oral BID BM  . heparin  5,000 Units Subcutaneous Q8H  . insulin aspart  0-9 Units Subcutaneous Q4H  . insulin glargine  50 Units Subcutaneous QAC breakfast  . levothyroxine  150 mcg Oral QAC breakfast  . metoprolol succinate  25 mg Oral QPM  . metoprolol succinate  50 mg Oral Daily  . pantoprazole  40 mg Oral Daily  .  polyethylene glycol  17 g Oral Daily  . senna-docusate  2 tablet Oral QHS  . Tbo-filgastrim (GRANIX) SQ  480 mcg Subcutaneous q1800   . lactated ringers 50 mL/hr at 01/04/19 0026  . piperacillin-tazobactam (ZOSYN)  IV 3.375 g (01/04/19 0526)    Assessment/Plan  Metastatic lung cancer - last dose of chemo on 9/23 Nonischemic cardiomyopathy HTN Malignant pleural effusion s/p thoracentesis  Leukopenia - WBC 3.8(9/28)>>3.1(9/29)>>1.6(9/30)>>0.8(10/1) Anemia of chronic disease  Abdominal pain, cholelithiasis The patient does not have evidence of acute cholecystitis.  She may have some chronic cholecystitis based off of her HIDA scan; however,  given her chemo status as well as her obesity, leukopenia, anemia, etc, she is very high risk for surgical intervention at this time given no acute infection.  Her gallbladder appears very under whelming on her imaging and it is difficult to definitively say this is the source of her current symptoms.  She could be having nausea from her chemo and pain from her disease as well as pleuritic pain from her effusion. (although pain still present after tap yesterday)    Symptomatic treatment would be recommended at this time.  She can follow up as an outpatient to discuss elective cholecystectomy in the future once she has completed her chemo etc as needed.  Can try liquids as able.  FEN: IV fluids, full liquids ID:  Zosyn 9/28>> day 4 [id number] Follow up:  TBD  Not sure of etiology of her acute tachypnea and hypertension. She is very poor historian. At beg of today's visit, her primary c/o are pulm - sob, etc and states abd pain is about same. During visit, moaned in pain and asked where she was hurting and she grabbed her RLQ.    Will get stat cxr to make sure no ptx.   Again, her gb is very underwhelming on imaging. She has mild wall thickening on ct & u/s without pericholecystic fluid or edema. HIDA was negative for acute cholecystitis.  Will review MRI with radiology.   Will discuss possibility of perc GB drain with radiology but again her gb is not distended and no active infection on hida.  gb drain could be permanent.   Leighton Ruff. Redmond Pulling, MD, FACS General, Bariatric, & Minimally Invasive Surgery [facility], [place]

## 2019-01-04 NOTE — Progress Notes (Addendum)
PROGRESS NOTE    Marilyn [name]  PYP:[id number] DOB: 1965/09/23 DOA: 01/01/2019 PCP: [name], FNP    Brief Narrative:  53 y.o. female with Past medical history of metastatic adenocarcinoma, nonischemic cardiomyopathy, HTN, hypothyroidism, malignant pleural effusion S/P thoracentesis x3, hypothyroidism. Patient presented with complaints of right-sided abdominal pain.  She reports that this pain has been ongoing for 3 months but in last 3 days this is worsened. -On chemotherapy, last infusion was 9/23 of Avastin, Alimta, carboplatin -On admission also reported chronic cough and shortness of breath -In the ED imaging was concerning for gallbladder wall thickening also noted to have recurrent right pleural effusion  Assessment & Plan:   1.    Acute on chronic cholecystitis  -Continues to complain of severe right-sided abdominal pain, requiring 4 mg of morphine every 2 hours -CT abdomen and pelvis noted thickened gallbladder wall, gallstones, CBD dilation, right upper quadrant ultrasound also noted cholelithiasis and gallbladder wall thickening -HIDA scan performed shows evidence of chronic cholecystitis  -Continue Empiric IV Zosyn, IV fluids -Appreciate general surgical consultation, unfortunately she is not a good candidate for cholecystectomy given metastatic cancer, recent chemotherapy especially Avastin on 9/23 -I gave her Neulasta yesterday for neutropenia  -Requested IR consult to evaluate for gallbladder drain, interventional radiologist Dr. Kathlene Cote reviewed her images and felt that since her gallbladder was contracted PERC drain would not be of much benefit -At high risk of clinical worsening, will transfer to stepdown -Reimage abdomen today  2.  Malignant recurrent right pleural effusion -Patient has metastatic adenocarcinoma of the lung and has recurrent right malignant effusion -Pt is s/p thoracentesis 9/29, yielding 920cc fluid -Dr. Irene Limbo following  3.  Metastatic  non-small cell lung cancer. -Followed by Dr. Irene Limbo -Currently on chemotherapy with carboplatin, Alimta and Avastin.  Last cycle on 12/27/2018 -Oncology following, now with neutropenia, see discussion  4.  Depression. -Continue on cymbalta  6.  Chronic diastolic dysfunction. -Prior history of nonischemic cardiomyopathy showed normal EF -Seems to be stable at this time  7. Type 2 Diabetes Mellitus, poorly controled without complication -last hemoglobin A1c was 10. -Continue Lantus, SSI coverage as tolerated  DVT prophylaxis: Heparin subq Code Status: Full Family Communication: Pt in room, family not at bedside, called and updated mother Disposition Plan: Transfer to stepdown  Consultants:   General Surgery  Oncology  Procedures:   Thoracentesis 9/29  Antimicrobials: Anti-infectives (From admission, onward)   Start     Dose/Rate Route Frequency Ordered Stop   01/01/19 2200  piperacillin-tazobactam (ZOSYN) IVPB 3.375 g     3.375 g 12.5 mL/hr over 240 Minutes Intravenous Every 8 hours 01/01/19 1450     01/01/19 1500  piperacillin-tazobactam (ZOSYN) IVPB 3.375 g     3.375 g 100 mL/hr over 30 Minutes Intravenous  Once 01/01/19 1450 01/01/19 2045      Subjective: -Continues to complain of severe right-sided abdominal pain getting morphine every 2 hours now  Objective: Vitals:   01/04/19 1103 01/04/19 1209 01/04/19 1323 01/04/19 1339  BP: (!) 152/109 (!) 167/115 (!) 174/108   Pulse: (!) 128 (!) 129  (!) 124  Resp:   (!) 23 (!) 35  Temp:      TempSrc:      SpO2: 100% 99%  97%  Weight:      Height:        Intake/Output Summary (Last 24 hours) at 01/04/2019 1400 Last data filed at 01/04/2019 0444 Gross per 24 hour  Intake -  Output 850 ml  Net -850 ml   Filed Weights   01/01/19 0416  Weight: 119.7 kg    Examination:  Gen: Morbidly obese African-American female, ill-appearing, sitting up in bed uncomfortable HEENT: PERRLA, Neck supple, no JVD Lungs:  Decreased breath sounds at both bases CVS: S1-S2 regular rhythm, tachycardic Abd: Soft obese, mildly distended, right-sided tenderness, diminished bowel sounds  extremities: No Cyanosis, Clubbing or edema Skin: no new rashes Psychiatry: Mood & affect appropriate.   Data Reviewed: I have personally reviewed following labs and imaging studies  CBC: Recent Labs  Lab 01/01/19 0541 01/01/19 1838 01/02/19 0322 01/02/19 1937 01/03/19 0437 01/04/19 0314  WBC 4.7 3.5* 3.1*  --  1.6* 0.8*  NEUTROABS  --  2.9  --   --  1.1* 0.2*  HGB 7.3* 6.8* 6.9* 8.5* 8.8* 8.6*  HCT 22.5* 21.5* 21.0* 25.6* 26.5* 25.9*  MCV 106.1* 106.4* 106.1*  --  101.9* 102.4*  PLT 211 183 183  --  154 329*   Basic Metabolic Panel: Recent Labs  Lab 01/01/19 0541 01/01/19 1838 01/02/19 0322 01/03/19 0437 01/04/19 0314  NA 133* 137 139 138 139  K 3.6 3.5 3.6 3.9 3.6  CL 101 105 106 105 104  CO2 20* 21* 24 25 26   GLUCOSE 326* 159* 156* 162* 74  BUN 7 6 7 12 12   CREATININE 0.88 0.82 0.79 0.87 0.82  CALCIUM 7.8* 7.5* 7.7* 7.6* 7.8*   GFR: Estimated Creatinine Clearance: 112.2 mL/min (by C-G formula based on SCr of 0.82 mg/dL). Liver Function Tests: Recent Labs  Lab 01/01/19 0541 01/01/19 1838 01/02/19 0322 01/03/19 0437 01/04/19 0314  AST 27 25 24 28 26   ALT 17 16 14 15 14   ALKPHOS 84 80 87 102 113  BILITOT 0.5 0.6 0.7 0.7 0.7  PROT 7.3 6.9 7.1 7.5 7.3  ALBUMIN 2.8* 2.7* 2.7* 2.8* 2.7*   Recent Labs  Lab 01/01/19 0541  LIPASE 14   No results for input(s): AMMONIA in the last 168 hours. Coagulation Profile: Recent Labs  Lab 01/01/19 1838  INR 1.1   Cardiac Enzymes: No results for input(s): CKTOTAL, CKMB, CKMBINDEX, TROPONINI in the last 168 hours. BNP (last 3 results) No results for input(s): PROBNP in the last 8760 hours. HbA1C: No results for input(s): HGBA1C in the last 72 hours. CBG: Recent Labs  Lab 01/04/19 0437 01/04/19 0744 01/04/19 0816 01/04/19 0855 01/04/19 1126   GLUCAP 63* 60* 49* 128* 109*   Lipid Profile: No results for input(s): CHOL, HDL, LDLCALC, TRIG, CHOLHDL, LDLDIRECT in the last 72 hours. Thyroid Function Tests: Recent Labs    01/01/19 1838  TSH 41.681*  FREET4 0.55*   Anemia Panel: Recent Labs    01/01/19 1838  VITAMINB12 2,017*   Sepsis Labs: Recent Labs  Lab 01/01/19 1659 01/01/19 1838  PROCALCITON  --  0.15  LATICACIDVEN 1.0 1.0    Recent Results (from the past 240 hour(s))  SARS CORONAVIRUS 2 (TAT 6-24 HRS) Nasopharyngeal Nasopharyngeal Swab     Status: None   Collection Time: 01/01/19  6:43 AM   Specimen: Nasopharyngeal Swab  Result Value Ref Range Status   SARS Coronavirus 2 NEGATIVE NEGATIVE Final    Comment: (NOTE) SARS-CoV-2 target nucleic acids are NOT DETECTED. The SARS-CoV-2 RNA is generally detectable in upper and lower respiratory specimens during the acute phase of infection. Negative results do not preclude SARS-CoV-2 infection, do not rule out co-infections with other pathogens, and should not be used as the sole basis for treatment or other patient  management decisions. Negative results must be combined with clinical observations, patient history, and epidemiological information. The expected result is Negative. Fact Sheet for Patients: SugarRoll.be Fact Sheet for Healthcare Providers: https://www.woods-mathews.com/ This test is not yet approved or cleared by the [place] FDA and  has been authorized for detection and/or diagnosis of SARS-CoV-2 by FDA under an Emergency Use Authorization (EUA). This EUA will remain  in effect (meaning this test can be used) for the duration of the COVID-19 declaration under Section 56 4(b)(1) of the Act, 21 U.S.C. section 360bbb-3(b)(1), unless the authorization is terminated or revoked sooner. Performed at [facility], [place] [address] [place], [place] [address]   Urine culture     Status: None    Collection Time: 01/01/19  9:31 AM   Specimen: Urine, Catheterized  Result Value Ref Range Status   Specimen Description   Final    URINE, CATHETERIZED Performed at [facility] [address] [place], [place] [address]    Special Requests   Final    NONE Performed at [facility], [place] [address] [place], [place] [address]    Culture   Final    NO GROWTH Performed at [facility], [place] [address], [place], [place] [address]    Report Status 01/03/2019 FINAL  Final  Culture, blood (x 2)     Status: None (Preliminary result)   Collection Time: 01/01/19  9:03 PM   Specimen: BLOOD  Result Value Ref Range Status   Specimen Description   Final    BLOOD LEFT ANTECUBITAL Performed at [facility] [address], [place], [place] [address]    Special Requests   Final    BOTTLES DRAWN AEROBIC AND ANAEROBIC Blood Culture adequate volume Performed at [facility] [address], [place], [place] [address]    Culture   Final    NO GROWTH 2 DAYS Performed at [facility] [address], [place], [place] [address]    Report Status PENDING  Incomplete  Culture, blood (x 2)     Status: None (Preliminary result)   Collection Time: 01/01/19  9:03 PM   Specimen: BLOOD  Result Value Ref Range Status   Specimen Description   Final    BLOOD RIGHT ANTECUBITAL Performed at [facility] [address], [place], [place] [address]    Special Requests   Final    BOTTLES DRAWN AEROBIC AND ANAEROBIC Blood Culture adequate volume Performed at [facility] [address], [place], [place] [address]    Culture   Final    NO GROWTH 2 DAYS Performed at [facility] [address], [place], [place] [address]    Report Status PENDING  Incomplete  Culture, body fluid-bottle     Status: None (Preliminary result)   Collection Time: 01/02/19 12:40 PM   Specimen: Pleura   Result Value Ref Range Status   Specimen Description PLEURAL LEFT  Final   Special Requests NONE  Final   Culture   Final    NO GROWTH 2 DAYS Performed at [facility], [address] [place], [place] [address]    Report Status PENDING  Incomplete  Gram stain     Status: None   Collection Time: 01/02/19 12:40 PM   Specimen: Pleura  Result Value Ref Range Status   Specimen Description PLEURAL LEFT  Final   Special Requests NONE  Final  Gram Stain   Final    MODERATE WBC PRESENT,BOTH PMN AND MONONUCLEAR NO ORGANISMS SEEN Performed at [facility], [place] [address], [place], [place] [address]    Report Status 01/02/2019 FINAL  Final     Radiology Studies: Dg Chest Port 1 View  Result Date: 01/04/2019 CLINICAL DATA:  Shortness of breath EXAM: PORTABLE CHEST 1 VIEW COMPARISON:  01/02/2019, 01/01/2019 CT FINDINGS: RIGHT-sided PowerPort tip overlies the superior vena cava. Heart size is partially obscured by opacities at the lung bases. Increased opacity at the RIGHT lung base and associated bilateral pleural effusions. Persistent RIGHT perihilar adenopathy. IMPRESSION: 1. Increased opacity at the RIGHT lung base and bilateral pleural effusions. 2. Persistent RIGHT perihilar adenopathy. 3. No pneumothorax. Electronically Signed   By: Nolon Nations M.D.   On: 01/04/2019 10:33    Scheduled Meds: . sodium chloride   Intravenous Once  . aspirin EC  81 mg Oral Daily  . atorvastatin  40 mg Oral q1800  . Chlorhexidine Gluconate Cloth  6 each Topical Daily  . dicyclomine  10 mg Oral BID AC  . docusate sodium  100 mg Oral BID  . DULoxetine  60 mg Oral Daily  . feeding supplement (ENSURE ENLIVE)  237 mL Oral BID BM  . feeding supplement (GLUCERNA SHAKE)  237 mL Oral BID BM  . heparin  5,000 Units Subcutaneous Q8H  . insulin aspart  0-9 Units Subcutaneous Q4H  . [START ON 01/05/2019] insulin glargine  20 Units Subcutaneous QAC breakfast  . levothyroxine  150 mcg Oral QAC  breakfast  . metoprolol succinate  25 mg Oral QPM  . metoprolol succinate  50 mg Oral Daily  . pantoprazole  40 mg Oral Daily  . polyethylene glycol  17 g Oral Daily  . senna-docusate  2 tablet Oral QHS  . Tbo-filgastrim (GRANIX) SQ  480 mcg Subcutaneous q1800   Continuous Infusions: . lactated ringers 50 mL/hr at 01/04/19 0026  . piperacillin-tazobactam (ZOSYN)  IV 3.375 g (01/04/19 1354)     LOS: 2 days   Domenic Polite, MD [facility]  01/04/2019, 2:00 PM

## 2019-01-04 NOTE — Progress Notes (Signed)
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 Constipation. Shortness of breath. Stage IV lung cancer. Gallstones. EXAM: MRI ABDOMEN WITHOUT CONTRAST  (INCLUDING MRCP) TECHNIQUE: Multiplanar multisequence MR imaging of the abdomen was performed. Heavily T2-weighted images of the biliary and pancreatic ducts were obtained, and three-dimensional MRCP images were rendered by post processing. COMPARISON:  01/01/2019 abdominal ultrasound. Abdominopelvic CT of 01/01/2019. FINDINGS: Mild limitations secondary to patient body habitus. Lower chest: Moderate right pleural effusion.  Normal heart size. Hepatobiliary: Normal noncontrast appearance of the liver. There is borderline to minimal intrahepatic biliary duct dilatation, including on 22/3. The gallbladder is stone filled, with stones up to 1.8 cm. The common duct is dilated, including at maximally 1.5 cm on 83/5. Tapers gradually in the region of the pancreatic head. No obstructive stone or mass identified. Pancreas:  Normal, without mass or ductal dilatation. Spleen:  Normal in size, without focal abnormality. Adrenals/Urinary Tract: Normal adrenal glands. Normal kidneys, without  hydronephrosis. Stomach/Bowel:  Normal stomach and abdominal bowel loops. Vascular/Lymphatic:  Normal aortic caliber. Prominent abdominal nodes within the retroperitoneum. None are pathologic by size criteria. Retrocrural adenopathy, including at 1.1 cm on 20/3, suspicious. Other:  No ascites. Musculoskeletal: No acute osseous abnormality. IMPRESSION: 1. Patient body habitus degradation. 2. Cholelithiasis with common duct dilatation. No cause identified. Most likely within normal variation, given today's normal bilirubin level. 3. Retrocrural adenopathy, suspicious for nodal metastasis in this patient with a history of lung cancer. Electronically Signed   By: Abigail Miyamoto M.D.   On: 01/01/2019 20:41   US Abdomen Limited  Result Date: 01/01/2019 CLINICAL DATA:  Abdominal pain EXAM:  ULTRASOUND ABDOMEN LIMITED RIGHT UPPER QUADRANT COMPARISON:  CT from the same day. FINDINGS: Gallbladder: There is gallbladder wall thickening. There is no pericholecystic free fluid. The sonographic Percell Miller sign is reported as positive. Gallstones are noted measuring up to approximately 1.1 cm. Evaluation of the gallbladder was limited by patient body habitus. The gallbladder appears somewhat contracted. Common bile duct: Diameter: 1.2 cm Liver: Diffuse increased echogenicity with slightly heterogeneous liver. Appearance typically secondary to fatty infiltration. Fibrosis secondary consideration. No secondary findings of cirrhosis noted. No focal hepatic lesion or intrahepatic biliary duct dilatation. Portal vein is patent on color Doppler imaging with normal direction of blood flow towards the liver. Other: A right-sided pleural effusion is noted. IMPRESSION: 1. Evaluation limited by patient body habitus and poor sonographic windows. 2. There is cholelithiasis with gallbladder wall thickening and a questionable positive sonographic Murphy sign. Findings are concerning for acute calculus cholecystitis in the appropriate clinical  setting. HIDA scan may be useful for confirmation given the gallbladder is not significantly distended. 3. Dilated common bile duct. This raises concern for an obstructing process such as choledocholithiasis. Correlation with laboratory studies is recommended. If there is clinical concern for an obstructing process, follow-up with MRCP/ERCP is recommended. 4. Right-sided pleural effusion. 5. Probable underlying hepatic steatosis. Electronically Signed   By: Constance Holster M.D.   On: 01/01/2019 09:33   Dg Chest Port 1 View  Result Date: 12/12/2018 CLINICAL DATA:  Respiratory failure, metastatic cancer EXAM: PORTABLE CHEST 1 VIEW COMPARISON:  12/11/2018 FINDINGS: No significant change in AP portable examination with layering moderate right pleural and post treatment appearance of the suprahilar right lung. No new airspace opacity. Right chest port catheter. IMPRESSION: No significant change in AP portable examination with layering moderate right pleural and post treatment appearance of the suprahilar right lung. No new airspace opacity. Electronically Signed   By: Eddie Candle M.D.   On: 12/12/2018 12:56   Dg Chest Port 1 View  Result Date: 12/10/2018 CLINICAL DATA:  Shortness of breath. EXAM: PORTABLE CHEST 1 VIEW COMPARISON:  November 16, 2018 FINDINGS: Right injectable port in stable position. The cardiac silhouette is normal. Clear left lung. Increased right pleural effusion with volume loss in the right hemithorax. Stable right perihilar mass. Osseous structures are without acute abnormality. Soft tissues are grossly normal. IMPRESSION: 1. Increased right pleural effusion with volume loss in the right hemithorax. 2. Stable right perihilar mass. Electronically Signed   By: Fidela Salisbury M.D.   On: 12/10/2018 19:16   US Thoracentesis Asp Pleural Space W/img Guide  Result Date: 01/02/2019 INDICATION: Patient with history of lung cancer with recurrent malignant right pleural effusion; request made  for diagnostic and therapeutic right thoracentesis. EXAM: ULTRASOUND GUIDED DIAGNOSTIC AND THERAPEUTIC RIGHT THORACENTESIS MEDICATIONS: None COMPLICATIONS: None immediate. PROCEDURE: An ultrasound guided thoracentesis was thoroughly discussed with the patient and questions answered. The benefits, risks, alternatives and complications were also discussed. The patient understands and wishes to proceed with the procedure. Written consent was obtained. Ultrasound was performed to localize and mark an adequate pocket of fluid in the right chest. The area was then prepped and draped in the normal sterile fashion. 1% Lidocaine was used for local anesthesia. Under ultrasound guidance a 6 Fr Safe-T-Centesis catheter was introduced. Thoracentesis was performed. The catheter was removed and a dressing applied. FINDINGS: A total of approximately 920 cc of yellow fluid was removed. Samples were sent to the laboratory as requested by the clinical team. IMPRESSION: Successful ultrasound guided diagnostic and therapeutic  right thoracentesis yielding 920 cc of pleural fluid. Read by: Rowe Robert, PA-C Electronically Signed   By: Jerilynn Mages.  Shick M.D.   On: 01/02/2019 12:02   US Thoracentesis Asp Pleural Space W/img Guide  Result Date: 12/11/2018 INDICATION: Symptomatic right sided pleural effusion EXAM: US THORACENTESIS ASP PLEURAL SPACE W/IMG GUIDE COMPARISON:  None. MEDICATIONS: 10 cc 1% lidocaine COMPLICATIONS: None immediate. TECHNIQUE: Informed written consent was obtained from the patient after a discussion of the risks, benefits and alternatives to treatment. A timeout was performed prior to the initiation of the procedure. Initial ultrasound scanning demonstrates a right pleural effusion. The lower chest was prepped and draped in the usual sterile fashion. 1% lidocaine was used for local anesthesia. Under direct ultrasound guidance, a 19 gauge, 7-cm, Yueh catheter was introduced. An ultrasound image was saved for documentation  purposes. The thoracentesis was performed. The catheter was removed and a dressing was applied. The patient tolerated the procedure well without immediate post procedural complication. The patient was escorted to have an upright chest radiograph. FINDINGS: A total of approximately 400 cc of yellow fluid was removed. Requested samples were sent to the laboratory. IMPRESSION: Successful ultrasound-guided right sided thoracentesis yielding 400 cc of pleural fluid. Read by Lavonia Drafts [facility] Electronically Signed   By: Sandi Mariscal M.D.   On: 12/11/2018 11:09    ASSESSMENT & PLAN:   53 yo with metastatic lung adenocarcinoma  1) metastatic lung adenocarcinoma 2) Recurrent malignant rt pleural effusion s/p thoracenteiss 3) Acute on chronic Cholecystitis 4) Chemotherapy therapy related neutropnix PLan -holding alimta and avastin at this time -surgery following -granix daily to discharge or atleast 5 days.for chemotherapy related to neutopeni ANC1100 -bentyl BID for biliary colic -chemotherapy on hold for now -apprecaite excellent care by hospitalist team   I spent 20 minutes counseling the patient face to face. The total time spent in the appointment was 25 minutes and more than 50% was on counseling and direct patient cares.    Sullivan Lone MD [place] AAHIVMS [facility] Hematology/Oncology Physician [facility]  (Office):       [phone] (Work cell):  [phone] (Fax):           [phone]

## 2019-01-04 NOTE — Progress Notes (Signed)
PT Cancellation Note  Patient Details Name: Marilyn [name] MRN: [id number] DOB: [birth year]   Cancelled Treatment:    Reason Eval/Treat Not Completed: Medical issues which prohibited therapy RN reports rapid response this morning and portable xray into room currently.  Will check back as schedule permits.   Marilyn [name],Marilyn [name] 01/04/2019, 10:23 AM [name], PT, DPT Acute Rehabilitation Services Office: [phone] Pager: [phone]

## 2019-01-04 NOTE — Progress Notes (Signed)
Patient ID: Marilyn [name], female   DOB: May 24, 1965, 53 y.o.   MRN: [id number] Request received for consideration of percutaneous cholecystostomy on patient.  Imaging studies were reviewed by Dr. Kathlene Cote.  The gallbladder is currently contracted around stones and therefore patient is not a candidate at this time unless the gallbladder becomes more distended. Please page Dr. Kathlene Cote at [phone] with any additional questions.

## 2019-01-04 NOTE — Progress Notes (Signed)
Inpatient Diabetes Program Recommendations  AACE/ADA: New Consensus Statement on Inpatient Glycemic Control (2015)  Target Ranges:  Prepandial:   less than 140 mg/dL      Peak postprandial:   less than 180 mg/dL (1-2 hours)      Critically ill patients:  140 - 180 mg/dL   Lab Results  Component Value Date   GLUCAP 128 (H) 01/04/2019   HGBA1C 10.1 (H) 11/16/2018    Review of Glycemic Control  Hypo this am - 49-63 mg/dL. Needs Lantus adjustment.  Inpatient Diabetes Program Recommendations:    Decrease Lantus to 40 units QD.  Will follow closely.  Thank you. Lorenda Peck, RD, LDN, CDE Inpatient Diabetes Coordinator [phone]

## 2019-01-04 NOTE — Progress Notes (Addendum)
D/w with [facility]  Pt is tender in RUQ She does have stones and some wall thickening on imaging Believe she prob does have sympt cholelithiasis and/or chronic cholecystitis  Since she is still tender in RUQ while on iv abx with increasing pain med reqmts & immunosuppressed & appears Rt lung situation is 'stable', I think it is reasonable to have IR evaluate for GB drain.   Leighton Ruff. Redmond Pulling, MD, FACS General, Bariatric, & Minimally Invasive Surgery [facility], [place]

## 2019-01-04 NOTE — Progress Notes (Signed)
Hypoglycemic Event  CBG: 60, 49  Treatment: Juice, 25 mg Dextrose  Symptoms: None  Follow-up CBG: Time: 4436 CBG Result: 128  Possible Reasons for Event: Patient not taking in food and limited fluids  Comments/MD notified: Dr. Marigene Ehlers

## 2019-01-04 NOTE — Significant Event (Signed)
Rapid Response Event Note  Overview: Time Called: 1025 Arrival Time: 1028 Event Type: Other (Comment), Cardiac, MEWS Notified by 4th floor RN staff, patient's MEWS was a red-scored 5-6. Patient having increased pain from cholelithiasis. Upon assessment, patient just had received pain medication.   Initial Focused Assessment: Neuro: Patient alert and oriented, patient having abdominal pain. Cardiac: HR ST, 130s, BP HTN see vital signs Respiratory: patient breathing 28/min, patient not labored at this time. O2 Sats 95-100% on nasal cannula 2L.  Interventions: Discussed with bedside RNs in regards to giving additional PRNs for pain. If patient having HTN as well, if patient still having issues with HTN after prn pain medication given, than notify MD.   Plan of Care (if not transferred): Continue to monitor on telemetry for now, if pain is not managed with PRNs or HTN does not improve patient will need to be transferred to SDU. Call Rapid Response if patient begins to have change in clinical status and if patient does not improve with interventions.         Brittin Janik C

## 2019-01-04 NOTE — Progress Notes (Signed)
OT Cancellation Note  Patient Details Name: Marilyn [name] MRN: [id number] DOB: October 05, 1965   Cancelled Treatment:    Reason Eval/Treat Not Completed: Medical issues which prohibited therapy   Medical issues which prohibited therapy RN reports rapid response this morning   Kari Baars, OT Acute Rehabilitation Services [phone] Office- [phone]    Doxie Augenstein, Edwena Felty D 01/04/2019, 11:23 AM

## 2019-01-05 LAB — CBC WITH DIFFERENTIAL/PLATELET
Abs Immature Granulocytes: 0.03 10*3/uL (ref 0.00–0.07)
Basophils Absolute: 0 10*3/uL (ref 0.0–0.1)
Basophils Relative: 2 %
Eosinophils Absolute: 0 10*3/uL (ref 0.0–0.5)
Eosinophils Relative: 1 %
HCT: 26.3 % — ABNORMAL LOW (ref 36.0–46.0)
Hemoglobin: 8.6 g/dL — ABNORMAL LOW (ref 12.0–15.0)
Immature Granulocytes: 2 %
Lymphocytes Relative: 37 %
Lymphs Abs: 0.5 10*3/uL — ABNORMAL LOW (ref 0.7–4.0)
MCH: 33.6 pg (ref 26.0–34.0)
MCHC: 32.7 g/dL (ref 30.0–36.0)
MCV: 102.7 fL — ABNORMAL HIGH (ref 80.0–100.0)
Monocytes Absolute: 0.1 10*3/uL (ref 0.1–1.0)
Monocytes Relative: 5 %
Neutro Abs: 0.7 10*3/uL — ABNORMAL LOW (ref 1.7–7.7)
Neutrophils Relative %: 53 %
Platelets: 78 10*3/uL — ABNORMAL LOW (ref 150–400)
RBC: 2.56 MIL/uL — ABNORMAL LOW (ref 3.87–5.11)
RDW: 19.7 % — ABNORMAL HIGH (ref 11.5–15.5)
WBC: 1.4 10*3/uL — CL (ref 4.0–10.5)
nRBC: 1.5 % — ABNORMAL HIGH (ref 0.0–0.2)

## 2019-01-05 LAB — COMPREHENSIVE METABOLIC PANEL
ALT: 15 U/L (ref 0–44)
AST: 31 U/L (ref 15–41)
Albumin: 2.7 g/dL — ABNORMAL LOW (ref 3.5–5.0)
Alkaline Phosphatase: 161 U/L — ABNORMAL HIGH (ref 38–126)
Anion gap: 10 (ref 5–15)
BUN: 10 mg/dL (ref 6–20)
CO2: 26 mmol/L (ref 22–32)
Calcium: 7.7 mg/dL — ABNORMAL LOW (ref 8.9–10.3)
Chloride: 103 mmol/L (ref 98–111)
Creatinine, Ser: 0.88 mg/dL (ref 0.44–1.00)
GFR calc Af Amer: >60 mL/min (ref >60)
GFR calc non Af Amer: >60 mL/min (ref >60)
Glucose, Bld: 114 mg/dL — ABNORMAL HIGH (ref 70–99)
Potassium: 3.9 mmol/L (ref 3.5–5.1)
Sodium: 139 mmol/L (ref 135–145)
Total Bilirubin: 0.8 mg/dL (ref 0.3–1.2)
Total Protein: 7.3 g/dL (ref 6.5–8.1)

## 2019-01-05 LAB — GLUCOSE, CAPILLARY
Glucose-Capillary: 103 mg/dL — ABNORMAL HIGH (ref 70–99)
Glucose-Capillary: 117 mg/dL — ABNORMAL HIGH (ref 70–99)
Glucose-Capillary: 118 mg/dL — ABNORMAL HIGH (ref 70–99)
Glucose-Capillary: 125 mg/dL — ABNORMAL HIGH (ref 70–99)
Glucose-Capillary: 126 mg/dL — ABNORMAL HIGH (ref 70–99)
Glucose-Capillary: 126 mg/dL — ABNORMAL HIGH (ref 70–99)

## 2019-01-05 MED ORDER — FUROSEMIDE 10 MG/ML IJ SOLN
40.0000 mg | Freq: Once | INTRAMUSCULAR | Status: AC
  Administered 2019-01-05: 40 mg via INTRAVENOUS
  Filled 2019-01-05: qty 4

## 2019-01-05 MED ORDER — MORPHINE SULFATE (PF) 2 MG/ML IV SOLN
2.0000 mg | INTRAVENOUS | Status: DC | PRN
  Administered 2019-01-05 – 2019-01-06 (×11): 2 mg via INTRAVENOUS
  Filled 2019-01-05 (×11): qty 1

## 2019-01-05 NOTE — Progress Notes (Signed)
CC:  Subjective: Triad in the room seeing pt as well Pt states her abd pain is better Had repeat CT a/p  Objective: Vital signs in last 24 hours: Temp:  [97.2 F (36.2 C)-99.1 F (37.3 C)] 98.2 F (36.8 C) (10/02 0314) Pulse Rate:  [116-140] 122 (10/02 0600) Resp:  [13-45] 19 (10/02 0700) BP: (145-212)/(60-127) 161/106 (10/02 0700) SpO2:  [75 %-100 %] 98 % (10/02 0600) Weight:  [119.9 kg] 119.9 kg (10/01 1330)  Intake/Output from previous day: 10/01 0701 - 10/02 0700 In: 2730.5 [I.V.:2554.2; IV Piggyback:176.3] Out: 575 [Urine:575] Intake/Output this shift: No intake/output data recorded.  Severely obese Some SOB  Not taking deep breaths so auscul difficult Tachy Obese, soft, does not grimace when press with stethoscope firmly into RUQ  Lab Results:  Recent Labs    01/04/19 0314 01/05/19 0631  WBC 0.8* 1.4*  HGB 8.6* 8.6*  HCT 25.9* 26.3*  PLT 130* 78*    BMET Recent Labs    01/04/19 0314 01/05/19 0631  NA 139 139  K 3.6 3.9  CL 104 103  CO2 26 26  GLUCOSE 74 114*  BUN 12 10  CREATININE 0.82 0.88  CALCIUM 7.8* 7.7*   PT/INR No results for input(s): LABPROT, INR in the last 72 hours.  Recent Labs  Lab 01/01/19 1838 01/02/19 0322 01/03/19 0437 01/04/19 0314 01/05/19 0631  AST 25 24 28 26 31   ALT 16 14 15 14 15   ALKPHOS 80 87 102 113 161*  BILITOT 0.6 0.7 0.7 0.7 0.8  PROT 6.9 7.1 7.5 7.3 7.3  ALBUMIN 2.7* 2.7* 2.8* 2.7* 2.7*     Lipase     Component Value Date/Time   LIPASE 14 01/01/2019 0541     Medications: . sodium chloride   Intravenous Once  . aspirin EC  81 mg Oral Daily  . atorvastatin  40 mg Oral q1800  . Chlorhexidine Gluconate Cloth  6 each Topical Q0600  . docusate sodium  100 mg Oral BID  . DULoxetine  60 mg Oral Daily  . feeding supplement (ENSURE ENLIVE)  237 mL Oral BID BM  . feeding supplement (GLUCERNA SHAKE)  237 mL Oral BID BM  . heparin  5,000 Units Subcutaneous Q8H  . hydrALAZINE  25 mg Oral Q6H  .  insulin aspart  0-9 Units Subcutaneous Q4H  . insulin glargine  20 Units Subcutaneous QAC breakfast  . levothyroxine  150 mcg Oral QAC breakfast  . mouth rinse  15 mL Mouth Rinse BID  . metoprolol succinate  25 mg Oral QPM  . metoprolol succinate  50 mg Oral Daily  . mupirocin ointment  1 application Nasal BID  . pantoprazole  40 mg Oral Daily  . polyethylene glycol  17 g Oral Daily  . senna-docusate  2 tablet Oral QHS  . Tbo-filgastrim (GRANIX) SQ  480 mcg Subcutaneous q1800   . sodium chloride    . sodium chloride 10 mL/hr at 01/05/19 0433  . piperacillin-tazobactam (ZOSYN)  IV 3.375 g (01/05/19 5397)    Assessment/Plan  Metastatic lung cancer - last dose of chemo on 9/23 Nonischemic cardiomyopathy HTN Malignant pleural effusion s/p thoracentesis  Leukopenia - WBC 3.8(9/28)>>3.1(9/29)>>1.6(9/30)>>0.8(10/1) Anemia of chronic disease  Abdominal pain, cholelithiasis  She may have some chronic cholecystitis based off of her HIDA scan; however, given her chemo status as well as her obesity, pancytopenia etc, she is very high risk for surgical intervention at this time given no acute infection.  Her gallbladder appears very  under whelming on her imaging (she did have some mild wall thickening on initial imaging) and it is difficult to definitively say this is the source of her current symptoms.  She could be having nausea from her chemo and pain from her disease as well as pleuritic pain from her effusion. (although pain still present after tap yesterday)    Symptomatic treatment would be recommended at this time.  She can follow up as an outpatient to discuss elective cholecystectomy in the future once she has completed her chemo etc as needed.  Can try liquids as able.  FEN: IV fluids, resume full liquids ID:  Zosyn 9/28>> day 5 GHW:[id number] Follow up:  TBD  She had repeat CT a/p 10/1 which showed no GB infection. Gallstones yes.  She is not really TTP in RUQ today so I think she is  improving. Can resume full liquids today Can finish out abx course   Leighton Ruff. Redmond Pulling, MD, FACS General, Bariatric, & Minimally Invasive Surgery [facility], [place]

## 2019-01-05 NOTE — Evaluation (Signed)
Physical Therapy Evaluation Patient Details Name: Marilyn [name] MRN: [id number] DOB: Aug 14, 1965 Today's Date: 01/05/2019   History of Present Illness  53 year old female admitted for recurrent R pleural effusinon, r/o cholisthiasis  PMH:  metastatic lung CA, recurrent pleural effusions, chronic cholecystitis, DM., OA, CHF and asthma  Clinical Impression  The patient required extra time to mobilize to sitting on bed edge. Patient was unable to tranfer to [facility] complaining of Nausea and abdominal pain.Patient lives alone with an aid coming for a short time. Patient may require increased assistance at Dc. She tolerates very little mobility at this time. Pt admitted with above diagnosis.  Pt currently with functional limitations due to the deficits listed below (see PT Problem List). Pt will benefit from skilled PT to increase their independence and safety with mobility to allow discharge to the venue listed below.   Patient's BP high  With diastolic > 885. RN aware. Premedicated with BP meds and  Pain meds.and HR in 120's.     Follow Up Recommendations Home health PT;SNF;Supervision/Assistance - 24 hour    Equipment Recommendations  None recommended by PT    Recommendations for Other Services       Precautions / Restrictions Precautions Precautions: Fall Restrictions Weight Bearing Restrictions: No      Mobility  Bed Mobility Overal bed mobility: Needs Assistance Bed Mobility: Supine to Sit     Supine to sit: Min assist;+2 for safety/equipment Sit to supine: Min assist;Mod assist;+2 for safety/equipment   General bed mobility comments: extra time, multiple stops during mobility  Transfers                 General transfer comment: not attempted due to nausea  Ambulation/Gait                Stairs            Wheelchair Mobility    Modified Rankin (Stroke Patients Only)       Balance   Sitting-balance support: Bilateral upper extremity  supported Sitting balance-Leahy Scale: Fair                                       Pertinent Vitals/Pain Pain Assessment: Faces Faces Pain Scale: Hurts whole lot Pain Location: abdomen Pain Descriptors / Indicators: Cramping;Discomfort;Grimacing;Guarding Pain Intervention(s): Monitored during session;Premedicated before session    Home Living Family/patient expects to be discharged to:: Private residence Living Arrangements: Alone Available Help at Discharge: Family;Available PRN/intermittently;Personal care attendant Type of Home: House Home Access: Ramped entrance     Home Layout: One level Home Equipment: Walker - 4 wheels Additional Comments: information from notes last month:  pt delayed with answering questions    Prior Function           Comments: Pt states she has an Engineer, production and Joaquim Lai that help her, 2:45 hours per day total     Hand Dominance        Extremity/Trunk Assessment   Upper Extremity Assessment Upper Extremity Assessment: Generalized weakness    Lower Extremity Assessment Lower Extremity Assessment: Generalized weakness    Cervical / Trunk Assessment Cervical / Trunk Assessment: Normal  Communication   Communication: (difficult to understand at times)  Cognition Arousal/Alertness: Awake/alert Behavior During Therapy: WFL for tasks assessed/performed;Anxious Overall Cognitive Status: No family/caregiver present to determine baseline cognitive functioning  General Comments: delayed responses, sleepy initially, then woke up, requested to break multiple times during therapy      General Comments General comments (skin integrity, edema, etc.): HR in 120s; BP's high.  initially 171/111 then 146/111 sitting; pt c/o dizziness. When returned to supine, 163/106. RN stated pt had been given BP meds.  BP and HR have been running high    Exercises     Assessment/Plan    PT Assessment  Patient needs continued PT services  PT Problem List Decreased strength;Decreased mobility;Decreased activity tolerance;Decreased balance;Decreased knowledge of use of DME       PT Treatment Interventions DME instruction;Gait training;Therapeutic activities;Therapeutic exercise;Functional mobility training;Patient/family education;Balance training    PT Goals (Current goals can be found in the Care Plan section)  Acute Rehab PT Goals Patient Stated Goal: none stated PT Goal Formulation: With patient Time For Goal Achievement: 01/19/19 Potential to Achieve Goals: Fair    Frequency Min 3X/week   Barriers to discharge Decreased caregiver support      Co-evaluation PT/OT/SLP Co-Evaluation/Treatment: Yes Reason for Co-Treatment: For patient/therapist safety PT goals addressed during session: Mobility/safety with mobility         AM-PAC PT "6 Clicks" Mobility  Outcome Measure Help needed turning from your back to your side while in a flat bed without using bedrails?: A Lot Help needed moving from lying on your back to sitting on the side of a flat bed without using bedrails?: A Lot Help needed moving to and from a bed to a chair (including a wheelchair)?: A Lot Help needed standing up from a chair using your arms (e.g., wheelchair or bedside chair)?: A Lot Help needed to walk in hospital room?: Total Help needed climbing 3-5 steps with a railing? : Total 6 Click Score: 10    End of Session   Activity Tolerance: Patient limited by pain Patient left: in bed;with call bell/phone within reach;with bed alarm set Nurse Communication: Mobility status PT Visit Diagnosis: Unsteadiness on feet (R26.81);Muscle weakness (generalized) (M62.81)    Time: 6195-0932 PT Time Calculation (min) (ACUTE ONLY): 33 min   Charges:   PT Evaluation $PT Eval Moderate Complexity: [place] Pager [phone] Office [phone]   Claretha Cooper 01/05/2019, 12:33 PM

## 2019-01-05 NOTE — Progress Notes (Unsigned)
The following biosimilar Zirabev (bevacizumab-bvzr) has been selected for use in this patient.  Demetrius Charity, PharmD, [place] [facility]cology Pharmacist Pharmacy Phone: [phone] 01/05/2019

## 2019-01-05 NOTE — Evaluation (Signed)
Occupational Therapy Evaluation Patient Details Name: NAYELIZ DECORDOVA MRN: [id number] DOB: 1966-02-18 Today's Date: 01/05/2019    History of Present Illness 53 year old female admitted for recurrent R pleural effusino.  PMH:  metastatic lung CA, recurrent pleural effusions, chronic cholecystitis, DM., OA, CHF and asthma   Clinical Impression   Pt was admitted for the above. She was able to sit EOB today; she did c/o dizziness and nausea and sat for extended time as she was limited by nausea. See BPs below. Of note, HR and BPs have been running high; she was orthostatic.  Pt needs extensive assistance for ADLs and she was unable to stand today. She reports she has aides that help at home.  Recommend SNF unless pt can arrange 24/7 (if so, recommend HHOT)     Follow Up Recommendations  SNF    Equipment Recommendations  3 in 1 bedside commode    Recommendations for Other Services       Precautions / Restrictions Precautions Precautions: Fall Restrictions Weight Bearing Restrictions: No      Mobility Bed Mobility Overal bed mobility: Needs Assistance Bed Mobility: Supine to Sit     Supine to sit: Min assist;+2 for safety/equipment Sit to supine: Min assist;Mod assist;+2 for safety/equipment   General bed mobility comments: extra time.  Pt did assist with scooting up HOB with min to mod A.  Transfers                 General transfer comment: not attempted due to nausea    Balance   Sitting-balance support: Bilateral upper extremity supported Sitting balance-Leahy Scale: Fair                                     ADL either performed or assessed with clinical judgement   ADL Overall ADL's : Needs assistance/impaired                                       General ADL Comments: pt sat eob and was limited by nausea. Able to perform grooming with set up.  Based on clinical judgment, mod A for UB adls and total A for LB, +2 assistance.   Did not mobilize beyond EOB due to nausea     Vision         Perception     Praxis      Pertinent Vitals/Pain Pain Assessment: No/denies pain(but c/o nausea when sitting)     Hand Dominance     Extremity/Trunk Assessment Upper Extremity Assessment Upper Extremity Assessment: Generalized weakness           Communication Communication Communication: (difficult to understand at times)   Cognition Arousal/Alertness: Awake/alert Behavior During Therapy: WFL for tasks assessed/performed Overall Cognitive Status: No family/caregiver present to determine baseline cognitive functioning                                 General Comments: delayed responses, sleepy initially, then woke up   General Comments  HR in 120s; BP's high.  initially 171/111 then 146/111 sitting; pt c/o dizziness. When returned to supine, 163/106. RN stated pt had been given BP meds.  BP and HR have been running high    Exercises     Shoulder Instructions  Home Living Family/patient expects to be discharged to:: Private residence Living Arrangements: Alone Available Help at Discharge: Family;Available PRN/intermittently;Personal care attendant Type of Home: House Home Access: Ramped entrance     Home Layout: One level     Bathroom Shower/Tub: Tub/shower unit;Walk-in shower   Bathroom Toilet: Standard     Home Equipment: Environmental consultant - 4 wheels   Additional Comments: information from notes last month:  pt delayed with answering questions      Prior Functioning/Environment          Comments: Pt states she has an Engineer, production and [employer]lette [employer] that help her, 2:45 hours per day total        OT Problem List: Decreased strength;Decreased activity tolerance;Decreased cognition;Decreased knowledge of use of DME or AE;Cardiopulmonary status limiting activity(standing balance NT)      OT Treatment/Interventions: Self-care/ADL training;Therapeutic exercise;Energy conservation;DME and/or AE  instruction;Therapeutic activities;Cognitive remediation/compensation;Patient/family education    OT Goals(Current goals can be found in the care plan section) Acute Rehab OT Goals Patient Stated Goal: none stated OT Goal Formulation: With patient Time For Goal Achievement: 01/19/19 Potential to Achieve Goals: Good ADL Goals Pt Will Transfer to Toilet: with min assist;bedside commode;stand pivot transfer Pt Will Perform Toileting - Clothing Manipulation and hygiene: with min assist;sit to/from stand Additional ADL Goal #1: Pt will respond to cues within 10 seconds for basic adls and transfers Additional ADL Goal #2: pt will tolerate 20 minutes of light activity/strengthening exercise with 3 rest breaks  OT Frequency: Min 2X/week   Barriers to D/C:            Co-evaluation              AM-PAC OT "6 Clicks" Daily Activity     Outcome Measure Help from another person eating meals?: A Little Help from another person taking care of personal grooming?: A Little Help from another person toileting, which includes using toliet, bedpan, or urinal?: A Lot Help from another person bathing (including washing, rinsing, drying)?: A Lot Help from another person to put on and taking off regular upper body clothing?: A Lot Help from another person to put on and taking off regular lower body clothing?: Total 6 Click Score: 13   End of Session Nurse Communication: (status with therapy)  Activity Tolerance: (limited by nausea) Patient left: in bed;with call bell/phone within reach;with bed alarm set  OT Visit Diagnosis: Muscle weakness (generalized) (M62.81)                Time: [id number] OT Time Calculation (min): 27 min Charges:  OT General Charges $OT Visit: 1 Visit OT Evaluation $OT Eval Low Complexity: 1 Low  Marica Otter, OTR/L Acute Rehabilitation Services [phone] [facility] pager [phone] office 01/05/2019  Rodell Marrs 01/05/2019, 11:45 AM

## 2019-01-05 NOTE — Progress Notes (Signed)
PROGRESS NOTE    Marilyn [name]  ZOX:[id number] DOB: [birth year] DOA: 01/01/2019 PCP: [name], FNP    Brief Narrative:  53 y.o. female with Past medical history of metastatic adenocarcinoma, nonischemic cardiomyopathy, HTN, hypothyroidism, malignant pleural effusion S/P thoracentesis x3, hypothyroidism. Patient presented with complaints of right-sided abdominal pain.  She reports that this pain has been ongoing for 3 months but in last 3 days this is worsened. -On chemotherapy, last infusion was 9/23 of Avastin, Alimta, carboplatin -On admission also reported chronic cough and shortness of breath -In the ED imaging was concerning for gallbladder wall thickening also noted to have recurrent right pleural effusion  Assessment & Plan:   1.    Acute on chronic cholecystitis  -Initial imaging studies with CT and ultrasound noted gallbladder wall thickening, gallstones, HIDA scan showed evidence of chronic cholecystitis -After initial worsening appears to be improving with empiric IV Zosyn and bowel rest -Repeat CT yesterday is underwhelming, gallbladder wall thickening appears to have improved -Appreciate general surgery input, she is not a surgical candidate given metastatic cancer, recent Chemotherapy especially Avastin on 9/23 -Continue empiric IV Zosyn, hold off on IV fluids today given small pleural effusions -Liquid diet, advance as tolerated -Discussed with interventional radiology Dr. Kathlene Cote as well, PERC drain would not be an option since her gallbladder is contracted currently  2.  Malignant recurrent right pleural effusion -Patient has metastatic adenocarcinoma of the lung and has recurrent right malignant effusion -Pt is s/p thoracentesis 9/29, yielding 920cc fluid, cytology positive for malignant effusion -Follow, will likely need thoracentesis again sometime next week  3.  Metastatic non-small cell lung cancer. -Followed by Dr. Irene Limbo -Currently on chemotherapy  with carboplatin, Alimta and Avastin.  Last cycle on 12/27/2018 -Oncology following,, remains neutropenic, continue Neupogen  4.  Depression. -Continue on cymbalta  6.    Acute on chronic diastolic dysfunction. -Prior history of nonischemic cardiomyopathy showed normal EF -IV Lasix x1 today, hold IV fluids  7. Type 2 Diabetes Mellitus, poorly controled without complication -last hemoglobin A1c was 10. -Continue low-dose Lantus at lower dose  DVT prophylaxis: Heparin subq Code Status: Full Family Communication: No family at bedside, called and updated mother yesterday Disposition Plan: Keep in stepdown today  Consultants:   General Surgery  Oncology  Procedures:   Thoracentesis 9/29  Antimicrobials: Anti-infectives (From admission, onward)   Start     Dose/Rate Route Frequency Ordered Stop   01/01/19 2200  piperacillin-tazobactam (ZOSYN) IVPB 3.375 g     3.375 g 12.5 mL/hr over 240 Minutes Intravenous Every 8 hours 01/01/19 1450     01/01/19 1500  piperacillin-tazobactam (ZOSYN) IVPB 3.375 g     3.375 g 100 mL/hr over 30 Minutes Intravenous  Once 01/01/19 1450 01/01/19 2045      Subjective: -Reports feeling a little better, breathing is at baseline, no nausea vomiting -Reports that her abdominal pain is more diffuse today but overall better  Objective: Vitals:   01/05/19 1100 01/05/19 1200 01/05/19 1238 01/05/19 1300  BP: (!) 181/102 (!) 145/107  135/88  Pulse:      Resp: (!) 23 17  (!) 22  Temp:   98.7 F (37.1 C)   TempSrc:   Axillary   SpO2:      Weight:      Height:        Intake/Output Summary (Last 24 hours) at 01/05/2019 1307 Last data filed at 01/05/2019 1300 Gross per 24 hour  Intake 2730.46 ml  Output 975 ml  Net 1755.46 ml   Filed Weights   01/01/19 0416 01/04/19 1330  Weight: 119.7 kg 119.9 kg    Examination:  Gen: Morbidly obese African-American female sitting up in bed chronically ill appearing, appears more comfortable than  yesterday HEENT: PERRLA, Neck supple, no JVD Lungs: Decreased breath sounds bases worse in the right CVS: RRR,No Gallops,Rubs or new Murmurs Abd: Soft obese, mildly distended, right-sided tenderness, diminished bowel sounds Extremities: No edema Skin: no new rashes Psychiatry: Mood & affect appropriate.   Data Reviewed: I have personally reviewed following labs and imaging studies  CBC: Recent Labs  Lab 01/01/19 1838 01/02/19 0322 01/02/19 1937 01/03/19 0437 01/04/19 0314 01/05/19 0631  WBC 3.5* 3.1*  --  1.6* 0.8* 1.4*  NEUTROABS 2.9  --   --  1.1* 0.2* 0.7*  HGB 6.8* 6.9* 8.5* 8.8* 8.6* 8.6*  HCT 21.5* 21.0* 25.6* 26.5* 25.9* 26.3*  MCV 106.4* 106.1*  --  101.9* 102.4* 102.7*  PLT 183 183  --  154 130* 78*   Basic Metabolic Panel: Recent Labs  Lab 01/01/19 1838 01/02/19 0322 01/03/19 0437 01/04/19 0314 01/05/19 0631  NA 137 139 138 139 139  K 3.5 3.6 3.9 3.6 3.9  CL 105 106 105 104 103  CO2 21* 24 25 26 26   GLUCOSE 159* 156* 162* 74 114*  BUN 6 7 12 12 10   CREATININE 0.82 0.79 0.87 0.82 0.88  CALCIUM 7.5* 7.7* 7.6* 7.8* 7.7*   GFR: Estimated Creatinine Clearance: 104.7 mL/min (by C-G formula based on SCr of 0.88 mg/dL). Liver Function Tests: Recent Labs  Lab 01/01/19 1838 01/02/19 0322 01/03/19 0437 01/04/19 0314 01/05/19 0631  AST 25 24 28 26 31   ALT 16 14 15 14 15   ALKPHOS 80 87 102 113 161*  BILITOT 0.6 0.7 0.7 0.7 0.8  PROT 6.9 7.1 7.5 7.3 7.3  ALBUMIN 2.7* 2.7* 2.8* 2.7* 2.7*   Recent Labs  Lab 01/01/19 0541  LIPASE 14   No results for input(s): AMMONIA in the last 168 hours. Coagulation Profile: Recent Labs  Lab 01/01/19 1838  INR 1.1   Cardiac Enzymes: No results for input(s): CKTOTAL, CKMB, CKMBINDEX, TROPONINI in the last 168 hours. BNP (last 3 results) No results for input(s): PROBNP in the last 8760 hours. HbA1C: No results for input(s): HGBA1C in the last 72 hours. CBG: Recent Labs  Lab 01/04/19 1627 01/04/19 2011  01/04/19 2348 01/05/19 0317 01/05/19 0815  GLUCAP 85 90 99 117* 103*   Lipid Profile: No results for input(s): CHOL, HDL, LDLCALC, TRIG, CHOLHDL, LDLDIRECT in the last 72 hours. Thyroid Function Tests: No results for input(s): TSH, T4TOTAL, FREET4, T3FREE, THYROIDAB in the last 72 hours. Anemia Panel: No results for input(s): VITAMINB12, FOLATE, FERRITIN, TIBC, IRON, RETICCTPCT in the last 72 hours. Sepsis Labs: Recent Labs  Lab 01/01/19 1659 01/01/19 1838  PROCALCITON  --  0.15  LATICACIDVEN 1.0 1.0    Recent Results (from the past 240 hour(s))  SARS CORONAVIRUS 2 (TAT 6-24 HRS) Nasopharyngeal Nasopharyngeal Swab     Status: None   Collection Time: 01/01/19  6:43 AM   Specimen: Nasopharyngeal Swab  Result Value Ref Range Status   SARS Coronavirus 2 NEGATIVE NEGATIVE Final    Comment: (NOTE) SARS-CoV-2 target nucleic acids are NOT DETECTED. The SARS-CoV-2 RNA is generally detectable in upper and lower respiratory specimens during the acute phase of infection. Negative results do not preclude SARS-CoV-2 infection, do not rule out co-infections with other pathogens, and should not be used  as the sole basis for treatment or other patient management decisions. Negative results must be combined with clinical observations, patient history, and epidemiological information. The expected result is Negative. Fact Sheet for Patients: SugarRoll.be Fact Sheet for Healthcare Providers: https://www.woods-mathews.com/ This test is not yet approved or cleared by the [place] FDA and  has been authorized for detection and/or diagnosis of SARS-CoV-2 by FDA under an Emergency Use Authorization (EUA). This EUA will remain  in effect (meaning this test can be used) for the duration of the COVID-19 declaration under Section 56 4(b)(1) of the Act, 21 U.S.C. section 360bbb-3(b)(1), unless the authorization is terminated or revoked sooner. Performed  at [facility], [place] [address] [place], [place] [address]   Urine culture     Status: None   Collection Time: 01/01/19  9:31 AM   Specimen: Urine, Catheterized  Result Value Ref Range Status   Specimen Description   Final    URINE, CATHETERIZED Performed at [facility] [address], [place], [place] [address]    Special Requests   Final    NONE Performed at [facility], [place] [address] [place], [place] [address]    Culture   Final    NO GROWTH Performed at [facility], [place] [address], [place], [place] [address]    Report Status 01/03/2019 FINAL  Final  Culture, blood (x 2)     Status: None (Preliminary result)   Collection Time: 01/01/19  9:03 PM   Specimen: BLOOD  Result Value Ref Range Status   Specimen Description   Final    BLOOD LEFT ANTECUBITAL Performed at [facility] [address], [place], [place] [address]    Special Requests   Final    BOTTLES DRAWN AEROBIC AND ANAEROBIC Blood Culture adequate volume Performed at [facility] [address] [place], [place] [address]    Culture   Final    NO GROWTH 3 DAYS Performed at [facility], [place] [address], [place], [place] [address]    Report Status PENDING  Incomplete  Culture, blood (x 2)     Status: None (Preliminary result)   Collection Time: 01/01/19  9:03 PM   Specimen: BLOOD  Result Value Ref Range Status   Specimen Description   Final    BLOOD RIGHT ANTECUBITAL Performed at [facility] [address] [place], [place] [address]    Special Requests   Final    BOTTLES DRAWN AEROBIC AND ANAEROBIC Blood Culture adequate volume Performed at [facility] [address], [place], [place] [address]    Culture   Final    NO GROWTH 3 DAYS Performed at [facility], [place] [address], [place], [place] [address]    Report Status PENDING  Incomplete  Culture, body  fluid-bottle     Status: None (Preliminary result)   Collection Time: 01/02/19 12:40 PM   Specimen: Pleura  Result Value Ref Range Status   Specimen Description PLEURAL LEFT  Final   Special Requests NONE  Final   Culture   Final    NO GROWTH 3 DAYS Performed at [facility], [address] [place], [place] [address]    Report Status PENDING  Incomplete  Gram stain     Status: None   Collection Time: 01/02/19 12:40 PM   Specimen: Pleura  Result Value Ref Range Status   Specimen Description PLEURAL LEFT  Final   Special Requests NONE  Final   Gram Stain   Final    MODERATE WBC PRESENT,BOTH PMN AND MONONUCLEAR NO ORGANISMS SEEN Performed at [facility], [place] [address], [place], [place] [address]    Report Status 01/02/2019 FINAL  Final  MRSA PCR Screening     Status: Abnormal   Collection Time: 01/04/19  1:39 PM   Specimen: Nasal Mucosa; Nasopharyngeal  Result Value Ref Range Status   MRSA by PCR POSITIVE (A) NEGATIVE Final    Comment:        The GeneXpert MRSA Assay (FDA approved for NASAL specimens only), is one component of a comprehensive MRSA colonization surveillance program. It is not intended to diagnose MRSA infection nor to guide or monitor treatment for MRSA infections. RESULT CALLED TO, READ BACK BY AND VERIFIED WITH: Lorna Dibble [id number] @ [address] Performed at [facility] [address] [place], [place] [address]      Radiology Studies: Ct Abdomen Pelvis W Contrast  Result Date: 01/04/2019 CLINICAL DATA:  Right-sided abdominal pain for a month, increasing over the last 3 days. History of metastatic adenocarcinoma. EXAM: CT ABDOMEN AND PELVIS WITH CONTRAST TECHNIQUE: Multidetector CT imaging of the abdomen and pelvis was performed using the standard protocol following bolus administration of intravenous contrast. CONTRAST:  151mL OMNIPAQUE IOHEXOL 300 MG/ML  SOLN COMPARISON:  01/01/2019 FINDINGS: Lower chest:  Moderate right and small left pleural effusions. There is dependent opacity in the lower lobes consistent with atelectasis. Right pleural effusion is smaller than on the prior exam. Left pleural effusion is new. Hepatobiliary: Normal liver. Gallbladder mildly distended. Gallbladder appears heterogeneous consistent with multiple stones, which were defined on a prior MRCP from 01/01/2019. No adjacent inflammation. Common bile duct dilated to 11 mm, measuring 1.5 cm on the prior MRCP, demonstrating distal tapering. Pancreas: Unremarkable. No pancreatic ductal dilatation or surrounding inflammatory changes. Spleen: Normal in size without focal abnormality. Adrenals/Urinary Tract: No adrenal masses. Kidneys normal in size orientation and position. No renal masses, stones or hydronephrosis. Normal ureters. Bladder is unremarkable. Stomach/Bowel: Stomach is unremarkable. Small bowel and colon are normal in caliber. No bowel wall thickening. No inflammation. Normal appendix visualized. Vascular/Lymphatic: Mildly enlarged retrocrural lymph node to the right of the aorta just below the aortic hiatus, 12 mm in short axis. This is stable from the prior CT. No other enlarged lymph nodes. Mild aortic atherosclerosis. No aneurysm. Reproductive: Uterus is prominent with evidence of fibroids, largest from the fundus measuring 3.2 cm. This appearance is stable. No adnexal masses. Other: No abdominal wall hernia.  No ascites. Musculoskeletal: No fracture or acute finding. No osteoblastic or osteolytic lesions. IMPRESSION: 1. No acute findings within the abdomen or pelvis. 2. Moderate right pleural effusion decreased from the prior CT. Small left pleural effusion new from the prior CT. Dependent atelectasis in the lower lobes. 3. Gallstones without evidence of acute cholecystitis. Chronic dilation of common bile duct. Findings stable from the recent prior MRCP. 4. No evidence of bowel inflammation.  Normal appendix visualized. 5.  Aortic atherosclerosis. Electronically Signed   By: Lajean Manes M.D.   On: 01/04/2019 19:46   Dg Chest Port 1 View  Result Date: 01/04/2019 CLINICAL DATA:  Shortness of breath EXAM: PORTABLE CHEST 1 VIEW COMPARISON:  01/02/2019, 01/01/2019 CT FINDINGS: RIGHT-sided PowerPort tip overlies the superior vena cava. Heart size is partially obscured by opacities at the lung bases. Increased opacity at the RIGHT lung base and associated  bilateral pleural effusions. Persistent RIGHT perihilar adenopathy. IMPRESSION: 1. Increased opacity at the RIGHT lung base and bilateral pleural effusions. 2. Persistent RIGHT perihilar adenopathy. 3. No pneumothorax. Electronically Signed   By: Nolon Nations M.D.   On: 01/04/2019 10:33    Scheduled Meds:  sodium chloride   Intravenous Once   aspirin EC  81 mg Oral Daily   atorvastatin  40 mg Oral q1800   Chlorhexidine Gluconate Cloth  6 each Topical Q0600   docusate sodium  100 mg Oral BID   DULoxetine  60 mg Oral Daily   feeding supplement (ENSURE ENLIVE)  237 mL Oral BID BM   feeding supplement (GLUCERNA SHAKE)  237 mL Oral BID BM   heparin  5,000 Units Subcutaneous Q8H   hydrALAZINE  25 mg Oral Q6H   insulin aspart  0-9 Units Subcutaneous Q4H   insulin glargine  20 Units Subcutaneous QAC breakfast   levothyroxine  150 mcg Oral QAC breakfast   mouth rinse  15 mL Mouth Rinse BID   metoprolol succinate  25 mg Oral QPM   metoprolol succinate  50 mg Oral Daily   mupirocin ointment  1 application Nasal BID   pantoprazole  40 mg Oral Daily   polyethylene glycol  17 g Oral Daily   senna-docusate  2 tablet Oral QHS   Tbo-filgastrim (GRANIX) SQ  480 mcg Subcutaneous q1800   Continuous Infusions:  sodium chloride     sodium chloride 10 mL/hr at 01/05/19 0433   piperacillin-tazobactam (ZOSYN)  IV Stopped (01/05/19 1059)     LOS: 3 days   Domenic Polite, MD [facility]  01/05/2019, 1:07 PM

## 2019-01-06 LAB — COMPREHENSIVE METABOLIC PANEL
ALT: 14 U/L (ref 0–44)
AST: 35 U/L (ref 15–41)
Albumin: 2.6 g/dL — ABNORMAL LOW (ref 3.5–5.0)
Alkaline Phosphatase: 157 U/L — ABNORMAL HIGH (ref 38–126)
Anion gap: 10 (ref 5–15)
BUN: 14 mg/dL (ref 6–20)
CO2: 26 mmol/L (ref 22–32)
Calcium: 7.9 mg/dL — ABNORMAL LOW (ref 8.9–10.3)
Chloride: 105 mmol/L (ref 98–111)
Creatinine, Ser: 0.95 mg/dL (ref 0.44–1.00)
GFR calc Af Amer: >60 mL/min (ref >60)
GFR calc non Af Amer: >60 mL/min (ref >60)
Glucose, Bld: 120 mg/dL — ABNORMAL HIGH (ref 70–99)
Potassium: 3.5 mmol/L (ref 3.5–5.1)
Sodium: 141 mmol/L (ref 135–145)
Total Bilirubin: 0.9 mg/dL (ref 0.3–1.2)
Total Protein: 7.7 g/dL (ref 6.5–8.1)

## 2019-01-06 LAB — CBC
HCT: 26.1 % — ABNORMAL LOW (ref 36.0–46.0)
Hemoglobin: 8.4 g/dL — ABNORMAL LOW (ref 12.0–15.0)
MCH: 33.2 pg (ref 26.0–34.0)
MCHC: 32.2 g/dL (ref 30.0–36.0)
MCV: 103.2 fL — ABNORMAL HIGH (ref 80.0–100.0)
Platelets: 45 10*3/uL — ABNORMAL LOW (ref 150–400)
RBC: 2.53 MIL/uL — ABNORMAL LOW (ref 3.87–5.11)
RDW: 19.3 % — ABNORMAL HIGH (ref 11.5–15.5)
WBC: 2.4 10*3/uL — ABNORMAL LOW (ref 4.0–10.5)
nRBC: 0.8 % — ABNORMAL HIGH (ref 0.0–0.2)

## 2019-01-06 LAB — GLUCOSE, CAPILLARY
Glucose-Capillary: 113 mg/dL — ABNORMAL HIGH (ref 70–99)
Glucose-Capillary: 117 mg/dL — ABNORMAL HIGH (ref 70–99)
Glucose-Capillary: 122 mg/dL — ABNORMAL HIGH (ref 70–99)
Glucose-Capillary: 127 mg/dL — ABNORMAL HIGH (ref 70–99)
Glucose-Capillary: 91 mg/dL (ref 70–99)

## 2019-01-06 MED ORDER — CIPROFLOXACIN HCL 500 MG PO TABS: 500.0000 mg | ORAL_TABLET | Freq: Two times a day (BID) | ORAL | Status: DC

## 2019-01-06 MED ORDER — POLYETHYLENE GLYCOL 3350 17 G PO PACK
17.0000 g | PACK | Freq: Two times a day (BID) | ORAL | Status: DC
  Administered 2019-01-06 – 2019-01-20 (×15): 17 g via ORAL
  Filled 2019-01-06 (×21): qty 1

## 2019-01-06 MED ORDER — SENNOSIDES-DOCUSATE SODIUM 8.6-50 MG PO TABS
2.0000 | ORAL_TABLET | Freq: Two times a day (BID) | ORAL | Status: DC
  Administered 2019-01-06 – 2019-01-11 (×8): 2 via ORAL
  Administered 2019-01-12: 10:00:00 1 via ORAL
  Administered 2019-01-12 – 2019-01-20 (×11): 2 via ORAL
  Filled 2019-01-06 (×26): qty 2

## 2019-01-06 MED ORDER — MORPHINE SULFATE (PF) 2 MG/ML IV SOLN
2.0000 mg | INTRAVENOUS | Status: DC | PRN
  Administered 2019-01-06 – 2019-01-15 (×14): 2 mg via INTRAVENOUS
  Filled 2019-01-06 (×14): qty 1

## 2019-01-06 NOTE — Progress Notes (Addendum)
PROGRESS NOTE    Marilyn [name]  DVV:[id number] DOB: 1965/04/08 DOA: 01/01/2019 PCP: [name], FNP  Brief Narrative:  53 y.o. female with Past medical history of metastatic adenocarcinoma, nonischemic cardiomyopathy, HTN, hypothyroidism, malignant pleural effusion S/P thoracentesis x3, hypothyroidism. Patient presented with complaints of right-sided abdominal pain.  She reports that this pain has been ongoing for 3 months but in last 3 days this is worsened. -On chemotherapy, last infusion was 9/23 of Avastin, Alimta, carboplatin -On admission also reported chronic cough and shortness of breath -In the ED imaging was concerning for gallbladder wall thickening also noted to have recurrent right pleural effusion  Assessment & Plan:   1.    Acute on chronic cholecystitis  -Initial imaging studies with CT and ultrasound noted gallbladder wall thickening, gallstones, HIDA scan showed evidence of chronic cholecystitis -Further complicated by neutropenia from recent chemotherapy -After initial worsening appears to be slowly improving -Repeat CT 10/1 gallbladder wall thickening appears to have improved -Appreciate general surgery input, she is not a surgical candidate given metastatic cancer, recent Chemotherapy especially Avastin on 9/23 -Continue empiric IV Zosyn day 4 -Continue liquid diet, IV morphine, she is requiring this every 2 hours -Out of bed to chair if possible -I called and discussed with interventional radiology Dr. Kathlene Cote as well, PERC drain would not be an option since her gallbladder is contracted currently -Continue to monitor, labs in a.m., support -Add laxatives, constipation could be contributing to lower abdominal discomfort now  2.  Malignant recurrent right pleural effusion -Patient has metastatic adenocarcinoma of the lung and has recurrent right malignant effusion -Pt is s/p thoracentesis 9/29, yielding 920cc fluid, cytology positive for malignant effusion  -Follow, will likely need thoracentesis again sometime next week  3.  Metastatic non-small cell lung cancer. -Followed by Dr. Irene Limbo -Currently on chemotherapy with carboplatin, Alimta and Avastin.  Last cycle on 12/27/2018 -Oncology following  4.   Neutropenia -Following chemotherapy, continue  Neulasta  5.  Depression. -Continue on cymbalta  6.    Acute on chronic diastolic dysfunction. -Prior history of nonischemic cardiomyopathy showed normal EF -IV Lasix x1 today, hold IV fluids  7. Type 2 Diabetes Mellitus, poorly controled without complication -last hemoglobin A1c was 10. -Continue low-dose Lantus at lower dose  DVT prophylaxis: stopped Heparin, SCDs Code Status: Full Family Communication: No family at bedside, called and updated mother 10/1 Disposition Plan: Keep in stepdown today  Consultants:   General Surgery  Oncology  Procedures:   Thoracentesis 9/29  Antimicrobials: Anti-infectives (From admission, onward)   Start     Dose/Rate Route Frequency Ordered Stop   01/01/19 2200  piperacillin-tazobactam (ZOSYN) IVPB 3.375 g     3.375 g 12.5 mL/hr over 240 Minutes Intravenous Every 8 hours 01/01/19 1450     01/01/19 1500  piperacillin-tazobactam (ZOSYN) IVPB 3.375 g     3.375 g 100 mL/hr over 30 Minutes Intravenous  Once 01/01/19 1450 01/01/19 2045      Subjective:  -Continues to report severe right-sided abdominal pain and some lower abdominal discomfort  Objective: Vitals:   01/06/19 0700 01/06/19 0800 01/06/19 0900 01/06/19 1046  BP: (!) 186/102  (!) 141/89 (!) 146/88  Pulse:    (!) 109  Resp: (!) 23  17   Temp:  97.7 F (36.5 C)    TempSrc:  Axillary    SpO2:      Weight:      Height:        Intake/Output Summary (Last 24 hours)  at 01/06/2019 1053 Last data filed at 01/06/2019 0200 Gross per 24 hour  Intake 413.01 ml  Output 700 ml  Net -286.99 ml   Filed Weights   01/01/19 0416 01/04/19 1330  Weight: 119.7 kg 119.9 kg     Examination:  Gen: Obese obese African-American female sitting up in bed, chronically ill-appearing, in mild distress HEENT: PERRLA, Neck supple, no JVD Lungs: Decreased breath sounds bases, worse in the right CVS: S1-S2, regular rhythm, tachycardic  abd: Soft obese, mildly distended, right-sided tenderness, diminished bowel sounds Extremities: No edema Skin: No new rashes Psychiatry: Mood and affect is appropriate  Data Reviewed: I have personally reviewed following labs and imaging studies  CBC: Recent Labs  Lab 01/01/19 1838 01/02/19 0322 01/02/19 1937 01/03/19 0437 01/04/19 0314 01/05/19 0631 01/06/19 0430  WBC 3.5* 3.1*  --  1.6* 0.8* 1.4* 2.4*  NEUTROABS 2.9  --   --  1.1* 0.2* 0.7*  --   HGB 6.8* 6.9* 8.5* 8.8* 8.6* 8.6* 8.4*  HCT 21.5* 21.0* 25.6* 26.5* 25.9* 26.3* 26.1*  MCV 106.4* 106.1*  --  101.9* 102.4* 102.7* 103.2*  PLT 183 183  --  154 130* 78* 45*   Basic Metabolic Panel: Recent Labs  Lab 01/02/19 0322 01/03/19 0437 01/04/19 0314 01/05/19 0631 01/06/19 0430  NA 139 138 139 139 141  K 3.6 3.9 3.6 3.9 3.5  CL 106 105 104 103 105  CO2 24 25 26 26 26   GLUCOSE 156* 162* 74 114* 120*  BUN 7 12 12 10 14   CREATININE 0.79 0.87 0.82 0.88 0.95  CALCIUM 7.7* 7.6* 7.8* 7.7* 7.9*   GFR: Estimated Creatinine Clearance: 97 mL/min (by C-G formula based on SCr of 0.95 mg/dL). Liver Function Tests: Recent Labs  Lab 01/02/19 0322 01/03/19 0437 01/04/19 0314 01/05/19 0631 01/06/19 0430  AST 24 28 26 31  35  ALT 14 15 14 15 14   ALKPHOS 87 102 113 161* 157*  BILITOT 0.7 0.7 0.7 0.8 0.9  PROT 7.1 7.5 7.3 7.3 7.7  ALBUMIN 2.7* 2.8* 2.7* 2.7* 2.6*   Recent Labs  Lab 01/01/19 0541  LIPASE 14   No results for input(s): AMMONIA in the last 168 hours. Coagulation Profile: Recent Labs  Lab 01/01/19 1838  INR 1.1   Cardiac Enzymes: No results for input(s): CKTOTAL, CKMB, CKMBINDEX, TROPONINI in the last 168 hours. BNP (last 3 results) No results for  input(s): PROBNP in the last 8760 hours. HbA1C: No results for input(s): HGBA1C in the last 72 hours. CBG: Recent Labs  Lab 01/05/19 1554 01/05/19 2032 01/05/19 2331 01/06/19 0413 01/06/19 0823  GLUCAP 126* 126* 125* 113* 117*   Lipid Profile: No results for input(s): CHOL, HDL, LDLCALC, TRIG, CHOLHDL, LDLDIRECT in the last 72 hours. Thyroid Function Tests: No results for input(s): TSH, T4TOTAL, FREET4, T3FREE, THYROIDAB in the last 72 hours. Anemia Panel: No results for input(s): VITAMINB12, FOLATE, FERRITIN, TIBC, IRON, RETICCTPCT in the last 72 hours. Sepsis Labs: Recent Labs  Lab 01/01/19 1659 01/01/19 1838  PROCALCITON  --  0.15  LATICACIDVEN 1.0 1.0    Recent Results (from the past 240 hour(s))  SARS CORONAVIRUS 2 (TAT 6-24 HRS) Nasopharyngeal Nasopharyngeal Swab     Status: None   Collection Time: 01/01/19  6:43 AM   Specimen: Nasopharyngeal Swab  Result Value Ref Range Status   SARS Coronavirus 2 NEGATIVE NEGATIVE Final    Comment: (NOTE) SARS-CoV-2 target nucleic acids are NOT DETECTED. The SARS-CoV-2 RNA is generally detectable in upper and  lower respiratory specimens during the acute phase of infection. Negative results do not preclude SARS-CoV-2 infection, do not rule out co-infections with other pathogens, and should not be used as the sole basis for treatment or other patient management decisions. Negative results must be combined with clinical observations, patient history, and epidemiological information. The expected result is Negative. Fact Sheet for Patients: SugarRoll.be Fact Sheet for Healthcare Providers: https://www.woods-mathews.com/ This test is not yet approved or cleared by the [place] FDA and  has been authorized for detection and/or diagnosis of SARS-CoV-2 by FDA under an Emergency Use Authorization (EUA). This EUA will remain  in effect (meaning this test can be used) for the duration of the  COVID-19 declaration under Section 56 4(b)(1) of the Act, 21 U.S.C. section 360bbb-3(b)(1), unless the authorization is terminated or revoked sooner. Performed at [facility], [place] [address], [place], [place] [address]   Urine culture     Status: None   Collection Time: 01/01/19  9:31 AM   Specimen: Urine, Catheterized  Result Value Ref Range Status   Specimen Description   Final    URINE, CATHETERIZED Performed at [facility] [address] [place], [place] [address]    Special Requests   Final    NONE Performed at [facility], [place] [address], [place], [place] [address]    Culture   Final    NO GROWTH Performed at [facility], [place] [address] [place], [place] [address]    Report Status 01/03/2019 FINAL  Final  Culture, blood (x 2)     Status: None (Preliminary result)   Collection Time: 01/01/19  9:03 PM   Specimen: BLOOD  Result Value Ref Range Status   Specimen Description   Final    BLOOD LEFT ANTECUBITAL Performed at [facility] [address], [place], [place] [address]    Special Requests   Final    BOTTLES DRAWN AEROBIC AND ANAEROBIC Blood Culture adequate volume Performed at [facility] [address], [place], [place] [address]    Culture   Final    NO GROWTH 3 DAYS Performed at [facility], [place] [address], [place], [place] [address]    Report Status PENDING  Incomplete  Culture, blood (x 2)     Status: None (Preliminary result)   Collection Time: 01/01/19  9:03 PM   Specimen: BLOOD  Result Value Ref Range Status   Specimen Description   Final    BLOOD RIGHT ANTECUBITAL Performed at [facility] [address], [place], [place] [address]    Special Requests   Final    BOTTLES DRAWN AEROBIC AND ANAEROBIC Blood Culture adequate volume Performed at [facility] [address], [place], [place] [address]    Culture    Final    NO GROWTH 3 DAYS Performed at [facility], [place] [address], [place], [place] [address]    Report Status PENDING  Incomplete  Culture, body fluid-bottle     Status: None (Preliminary result)   Collection Time: 01/02/19 12:40 PM   Specimen: Pleura  Result Value Ref Range Status   Specimen Description PLEURAL LEFT  Final   Special Requests NONE  Final   Culture   Final    NO GROWTH 3 DAYS Performed at [facility], [address], [place],  [address]    Report Status PENDING  Incomplete  Gram stain  Status: None   Collection Time: 01/02/19 12:40 PM   Specimen: Pleura  Result Value Ref Range Status   Specimen Description PLEURAL LEFT  Final   Special Requests NONE  Final   Gram Stain   Final    MODERATE WBC PRESENT,BOTH PMN AND MONONUCLEAR NO ORGANISMS SEEN Performed at [facility], [address], [place], [place] [address]    Report Status 01/02/2019 FINAL  Final  MRSA PCR Screening     Status: Abnormal   Collection Time: 01/04/19  1:39 PM   Specimen: Nasal Mucosa; Nasopharyngeal  Result Value Ref Range Status   MRSA by PCR POSITIVE (A) NEGATIVE Final    Comment:        The GeneXpert MRSA Assay (FDA approved for NASAL specimens only), is one component of a comprehensive MRSA colonization surveillance program. It is not intended to diagnose MRSA infection nor to guide or monitor treatment for MRSA infections. RESULT CALLED TO, READ BACK BY AND VERIFIED WITH: Lorna Dibble [id number] @ [address] Performed at [facility] [address], [place], [place] [address]      Radiology Studies: Ct Abdomen Pelvis W Contrast  Result Date: 01/04/2019 CLINICAL DATA:  Right-sided abdominal pain for a month, increasing over the last 3 days. History of metastatic adenocarcinoma. EXAM: CT ABDOMEN AND PELVIS WITH CONTRAST TECHNIQUE: Multidetector CT imaging of the abdomen and pelvis was performed using the standard  protocol following bolus administration of intravenous contrast. CONTRAST:  185mL OMNIPAQUE IOHEXOL 300 MG/ML  SOLN COMPARISON:  01/01/2019 FINDINGS: Lower chest: Moderate right and small left pleural effusions. There is dependent opacity in the lower lobes consistent with atelectasis. Right pleural effusion is smaller than on the prior exam. Left pleural effusion is new. Hepatobiliary: Normal liver. Gallbladder mildly distended. Gallbladder appears heterogeneous consistent with multiple stones, which were defined on a prior MRCP from 01/01/2019. No adjacent inflammation. Common bile duct dilated to 11 mm, measuring 1.5 cm on the prior MRCP, demonstrating distal tapering. Pancreas: Unremarkable. No pancreatic ductal dilatation or surrounding inflammatory changes. Spleen: Normal in size without focal abnormality. Adrenals/Urinary Tract: No adrenal masses. Kidneys normal in size orientation and position. No renal masses, stones or hydronephrosis. Normal ureters. Bladder is unremarkable. Stomach/Bowel: Stomach is unremarkable. Small bowel and colon are normal in caliber. No bowel wall thickening. No inflammation. Normal appendix visualized. Vascular/Lymphatic: Mildly enlarged retrocrural lymph node to the right of the aorta just below the aortic hiatus, 12 mm in short axis. This is stable from the prior CT. No other enlarged lymph nodes. Mild aortic atherosclerosis. No aneurysm. Reproductive: Uterus is prominent with evidence of fibroids, largest from the fundus measuring 3.2 cm. This appearance is stable. No adnexal masses. Other: No abdominal wall hernia.  No ascites. Musculoskeletal: No fracture or acute finding. No osteoblastic or osteolytic lesions. IMPRESSION: 1. No acute findings within the abdomen or pelvis. 2. Moderate right pleural effusion decreased from the prior CT. Small left pleural effusion new from the prior CT. Dependent atelectasis in the lower lobes. 3. Gallstones without evidence of acute  cholecystitis. Chronic dilation of common bile duct. Findings stable from the recent prior MRCP. 4. No evidence of bowel inflammation.  Normal appendix visualized. 5. Aortic atherosclerosis. Electronically Signed   By: Lajean Manes M.D.   On: 01/04/2019 19:46    Scheduled Meds: . sodium chloride   Intravenous Once  . aspirin EC  81 mg Oral Daily  . atorvastatin  40 mg Oral q1800  .  Chlorhexidine Gluconate Cloth  6 each Topical Q0600  . docusate sodium  100 mg Oral BID  . DULoxetine  60 mg Oral Daily  . feeding supplement (ENSURE ENLIVE)  237 mL Oral BID BM  . feeding supplement (GLUCERNA SHAKE)  237 mL Oral BID BM  . hydrALAZINE  25 mg Oral Q6H  . insulin aspart  0-9 Units Subcutaneous Q4H  . insulin glargine  20 Units Subcutaneous QAC breakfast  . levothyroxine  150 mcg Oral QAC breakfast  . mouth rinse  15 mL Mouth Rinse BID  . metoprolol succinate  25 mg Oral QPM  . metoprolol succinate  50 mg Oral Daily  . mupirocin ointment  1 application Nasal BID  . pantoprazole  40 mg Oral Daily  . polyethylene glycol  17 g Oral BID  . senna-docusate  2 tablet Oral BID  . Tbo-filgastrim (GRANIX) SQ  480 mcg Subcutaneous q1800   Continuous Infusions: . sodium chloride    . sodium chloride 10 mL/hr at 01/06/19 0200  . piperacillin-tazobactam (ZOSYN)  IV Stopped (01/06/19 0950)     LOS: 4 days   Domenic Polite, MD [facility]  01/06/2019, 10:53 AM

## 2019-01-06 NOTE — Progress Notes (Signed)
CC:  Subjective: Pt having pain and nausea this AM.    Objective: Vital signs in last 24 hours: Temp:  [97.5 F (36.4 C)-98.7 F (37.1 C)] 97.6 F (36.4 C) (10/03 0422) Resp:  [13-25] 23 (10/03 0700) BP: (121-186)/(76-124) 186/102 (10/03 0700) SpO2:  [98 %-100 %] 100 % (10/03 0000)  Intake/Output from previous day: 10/02 0701 - 10/03 0700 In: 413 [P.O.:60; I.V.:212.8; IV Piggyback:140.2] Out: 700 [Urine:700] Intake/Output this shift: No intake/output data recorded.  Alert and oriented.  Mild distress Breathing comfortably. CV - regular Abd - soft, non distended, no guarding with deep pressure in epigastrium and RUQ.    Lab Results:  Recent Labs    01/05/19 0631 01/06/19 0430  WBC 1.4* 2.4*  HGB 8.6* 8.4*  HCT 26.3* 26.1*  PLT 78* 45*    BMET Recent Labs    01/05/19 0631 01/06/19 0430  NA 139 141  K 3.9 3.5  CL 103 105  CO2 26 26  GLUCOSE 114* 120*  BUN 10 14  CREATININE 0.88 0.95  CALCIUM 7.7* 7.9*   PT/INR No results for input(s): LABPROT, INR in the last 72 hours.  Recent Labs  Lab 01/02/19 0322 01/03/19 0437 01/04/19 0314 01/05/19 0631 01/06/19 0430  AST 24 28 26 31  35  ALT 14 15 14 15 14   ALKPHOS 87 102 113 161* 157*  BILITOT 0.7 0.7 0.7 0.8 0.9  PROT 7.1 7.5 7.3 7.3 7.7  ALBUMIN 2.7* 2.8* 2.7* 2.7* 2.6*     Lipase     Component Value Date/Time   LIPASE 14 01/01/2019 0541     Medications: . sodium chloride   Intravenous Once  . aspirin EC  81 mg Oral Daily  . atorvastatin  40 mg Oral q1800  . Chlorhexidine Gluconate Cloth  6 each Topical Q0600  . docusate sodium  100 mg Oral BID  . DULoxetine  60 mg Oral Daily  . feeding supplement (ENSURE ENLIVE)  237 mL Oral BID BM  . feeding supplement (GLUCERNA SHAKE)  237 mL Oral BID BM  . hydrALAZINE  25 mg Oral Q6H  . insulin aspart  0-9 Units Subcutaneous Q4H  . insulin glargine  20 Units Subcutaneous QAC breakfast  . levothyroxine  150 mcg Oral QAC breakfast  . mouth rinse   15 mL Mouth Rinse BID  . metoprolol succinate  25 mg Oral QPM  . metoprolol succinate  50 mg Oral Daily  . mupirocin ointment  1 application Nasal BID  . pantoprazole  40 mg Oral Daily  . polyethylene glycol  17 g Oral Daily  . senna-docusate  2 tablet Oral QHS  . Tbo-filgastrim (GRANIX) SQ  480 mcg Subcutaneous q1800   . sodium chloride    . sodium chloride 10 mL/hr at 01/06/19 0200  . piperacillin-tazobactam (ZOSYN)  IV 3.375 g (01/06/19 0550)    Assessment/Plan  Metastatic lung cancer - last dose of chemo on 9/23 Nonischemic cardiomyopathy HTN Malignant pleural effusion s/p thoracentesis  Leukopenia - WBC 3.8(9/28)>>3.1(9/29)>>1.6(9/30)>>0.8(10/1)<<2.4 (10/3) Anemia of chronic disease  Abdominal pain, cholelithiasis No significant change.  Probable chronic cholecystitis, but she is very high risk for surgical intervention at this time given no evidence of acute infection.  No dramatic imaging findings and hida shows filling of gallbladder, albeit slow.  Symptomatic treatment would be recommended at this time.  She can follow up as an outpatient to discuss elective cholecystectomy in the future once she has completed her chemo etc as needed.  Can try  liquids as able. Minimally tender RUQ  FEN: IV fluids, resume full liquids ID:  Zosyn 9/28>> day 6 CQP:[id number] Follow up:  TBD  She had repeat CT a/p 10/1 which showed no GB infection. Gallstones yes.   Can resume full liquids today Can finish out abx course   Milus Height, MD [facility], General Surgery, Trauma and [facility], [place] [phone] Check [email or web address], password [facility] for coverage night/weekend

## 2019-01-07 LAB — CULTURE, BLOOD (ROUTINE X 2)
Culture: NO GROWTH
Culture: NO GROWTH
Special Requests: ADEQUATE
Special Requests: ADEQUATE

## 2019-01-07 LAB — CBC
HCT: 27.7 % — ABNORMAL LOW (ref 36.0–46.0)
Hemoglobin: 9 g/dL — ABNORMAL LOW (ref 12.0–15.0)
MCH: 33.5 pg (ref 26.0–34.0)
MCHC: 32.5 g/dL (ref 30.0–36.0)
MCV: 103 fL — ABNORMAL HIGH (ref 80.0–100.0)
Platelets: 26 10*3/uL — CL (ref 150–400)
RBC: 2.69 MIL/uL — ABNORMAL LOW (ref 3.87–5.11)
RDW: 19.4 % — ABNORMAL HIGH (ref 11.5–15.5)
WBC: 4.2 10*3/uL (ref 4.0–10.5)
nRBC: 0 % (ref 0.0–0.2)

## 2019-01-07 LAB — GLUCOSE, CAPILLARY
Glucose-Capillary: 110 mg/dL — ABNORMAL HIGH (ref 70–99)
Glucose-Capillary: 134 mg/dL — ABNORMAL HIGH (ref 70–99)
Glucose-Capillary: 137 mg/dL — ABNORMAL HIGH (ref 70–99)
Glucose-Capillary: 147 mg/dL — ABNORMAL HIGH (ref 70–99)
Glucose-Capillary: 129 mg/dL — ABNORMAL HIGH (ref 70–99)
Glucose-Capillary: 135 mg/dL — ABNORMAL HIGH (ref 70–99)

## 2019-01-07 LAB — BASIC METABOLIC PANEL
Anion gap: 14 (ref 5–15)
BUN: 15 mg/dL (ref 6–20)
CO2: 24 mmol/L (ref 22–32)
Calcium: 8 mg/dL — ABNORMAL LOW (ref 8.9–10.3)
Chloride: 104 mmol/L (ref 98–111)
Creatinine, Ser: 0.9 mg/dL (ref 0.44–1.00)
GFR calc Af Amer: >60 mL/min (ref >60)
GFR calc non Af Amer: >60 mL/min (ref >60)
Glucose, Bld: 122 mg/dL — ABNORMAL HIGH (ref 70–99)
Potassium: 3.7 mmol/L (ref 3.5–5.1)
Sodium: 142 mmol/L (ref 135–145)

## 2019-01-07 LAB — CULTURE, BODY FLUID W GRAM STAIN -BOTTLE: Culture: NO GROWTH

## 2019-01-07 MED ORDER — BISACODYL 10 MG RE SUPP
10.0000 mg | Freq: Once | RECTAL | Status: AC
  Administered 2019-01-07: 10 mg via RECTAL
  Filled 2019-01-07: qty 1

## 2019-01-07 MED ORDER — ALTEPLASE 2 MG IJ SOLR
2.0000 mg | Freq: Once | INTRAMUSCULAR | Status: AC
  Administered 2019-01-07: 2 mg

## 2019-01-07 MED ORDER — INSULIN ASPART 100 UNIT/ML ~~LOC~~ SOLN
0.0000 [IU] | SUBCUTANEOUS | Status: DC
  Administered 2019-01-09 (×4): 3 [IU] via SUBCUTANEOUS
  Administered 2019-01-10 (×2): 5 [IU] via SUBCUTANEOUS
  Administered 2019-01-10 (×2): 3 [IU] via SUBCUTANEOUS
  Administered 2019-01-10 – 2019-01-11 (×2): 2 [IU] via SUBCUTANEOUS
  Administered 2019-01-11: 1 [IU] via SUBCUTANEOUS
  Administered 2019-01-11 (×2): 3 [IU] via SUBCUTANEOUS
  Administered 2019-01-11: 1 [IU] via SUBCUTANEOUS
  Administered 2019-01-11: 7 [IU] via SUBCUTANEOUS
  Administered 2019-01-12 (×4): 2 [IU] via SUBCUTANEOUS
  Administered 2019-01-12: 12:00:00 3 [IU] via SUBCUTANEOUS
  Administered 2019-01-12: 2 [IU] via SUBCUTANEOUS
  Administered 2019-01-13: 1 [IU] via SUBCUTANEOUS
  Administered 2019-01-13 (×3): 2 [IU] via SUBCUTANEOUS
  Administered 2019-01-14 (×2): 3 [IU] via SUBCUTANEOUS
  Administered 2019-01-14 (×2): 1 [IU] via SUBCUTANEOUS
  Administered 2019-01-14 (×2): 3 [IU] via SUBCUTANEOUS
  Administered 2019-01-15: 2 [IU] via SUBCUTANEOUS
  Administered 2019-01-15 (×2): 1 [IU] via SUBCUTANEOUS
  Administered 2019-01-16 (×5): 2 [IU] via SUBCUTANEOUS
  Administered 2019-01-16: 1 [IU] via SUBCUTANEOUS
  Administered 2019-01-17 (×2): 2 [IU] via SUBCUTANEOUS
  Administered 2019-01-17: 18:00:00 3 [IU] via SUBCUTANEOUS
  Administered 2019-01-17: 2 [IU] via SUBCUTANEOUS
  Administered 2019-01-18 (×2): 1 [IU] via SUBCUTANEOUS
  Administered 2019-01-18 – 2019-01-19 (×2): 2 [IU] via SUBCUTANEOUS
  Administered 2019-01-19: 1 [IU] via SUBCUTANEOUS
  Administered 2019-01-19: 2 [IU] via SUBCUTANEOUS
  Administered 2019-01-19 – 2019-01-20 (×2): 1 [IU] via SUBCUTANEOUS
  Administered 2019-01-20 (×2): 2 [IU] via SUBCUTANEOUS

## 2019-01-07 NOTE — Progress Notes (Addendum)
PROGRESS NOTE    Marilyn [name]  XBM:[id number] DOB: 1965/05/23 DOA: 01/01/2019 PCP: [name], FNP  Brief Narrative:  53 y.o. female with Past medical history of metastatic adenocarcinoma, nonischemic cardiomyopathy, HTN, hypothyroidism, malignant pleural effusion S/P thoracentesis x3, hypothyroidism. Patient presented with complaints of right-sided abdominal pain.  She reported that this pain has been ongoing for 3 months but in last 3 days this is worsened. -On chemotherapy, last infusion was 9/23 of Avastin, Alimta, carboplatin -On admission also reported chronic cough and shortness of breath -In the ED imaging was concerning for gallbladder wall thickening also noted to have recurrent right pleural effusion -Initial CT and ultrasound noted gallbladder wall thickening, gallstones, HIDA scan noted chronic cholecystitis, situation was complicated by neutropenia from chemotherapy -Followed by general surgery, poor surgical candidate given metastatic cancer  Assessment & Plan:   1.    Acute on chronic cholecystitis  -Initial imaging studies with CT and ultrasound noted gallbladder wall thickening, gallstones, HIDA scan showed evidence of chronic cholecystitis -Further complicated by neutropenia from recent chemotherapy -Repeat CT abdomen pelvis 10/1 without acute findings, gallbladder wall thickening appears to have improved, unfortunately patient continues to have persistent severe right-sided abdominal pain, nausea vomiting yesterday -Remains on full liquid diet but not eating or drinking anything per staff -appreciate general surgery input, she is not a surgical candidate given metastatic cancer, recent Chemotherapy especially Avastin on 9/23 -Remains on empiric Zosyn IV day 5 -Continue liquid diet as tolerated, IV morphine every 3 hours as needed -: Discussed with IR, PERC drain on an option since her gallbladder is contracted -Continue laxatives, add suppository today  2.   Malignant recurrent right pleural effusion -Patient has metastatic adenocarcinoma of the lung and has recurrent right malignant effusion -Pt is s/p thoracentesis 9/29, yielding 920cc fluid, cytology positive for malignant effusion -Follow, will likely need thoracentesis again sometime next week -Will repeat x-ray next week  3.  Metastatic non-small cell lung cancer. -Followed by Dr. Irene Limbo -Currently on chemotherapy with carboplatin, Alimta and Avastin.  Last cycle on 12/27/2018 -Oncology following  4.   Thrombocytopenia  -Due to sepsis, chemotherapy, worsened by recent heparin use  -Heparin discontinued several days ago  -Check HIT antibody and SRA -Monitor  5.  Depression. -Continue on cymbalta  6.    Acute on chronic diastolic dysfunction. -Prior history of nonischemic cardiomyopathy showed normal EF -Given Lasix 2 days ago, monitor, dose periodically  7. Type 2 Diabetes Mellitus, poorly controled without complication -last hemoglobin A1c was 10. -Continue low-dose Lantus   DVT prophylaxis: SCDs Code Status: Full Family Communication: No family at bedside, called and updated mother 10/1 Disposition Plan: Keep in stepdown today  Consultants:   General Surgery  Oncology  Procedures:   Thoracentesis 9/29  Antimicrobials: Anti-infectives (From admission, onward)   Start     Dose/Rate Route Frequency Ordered Stop   01/06/19 1215  ciprofloxacin (CIPRO) tablet 500 mg  Status:  Discontinued     500 mg Oral 2 times daily 01/06/19 1200 01/06/19 1201   01/01/19 2200  piperacillin-tazobactam (ZOSYN) IVPB 3.375 g     3.375 g 12.5 mL/hr over 240 Minutes Intravenous Every 8 hours 01/01/19 1450     01/01/19 1500  piperacillin-tazobactam (ZOSYN) IVPB 3.375 g     3.375 g 100 mL/hr over 30 Minutes Intravenous  Once 01/01/19 1450 01/01/19 2045      Subjective: -Continues to complain of severe right-sided pain, vomited yesterday -No p.o. intake per RN  Objective: Vitals:  01/07/19 0830 01/07/19 0840 01/07/19 0900 01/07/19 0930  BP:   (!) 174/106   Pulse: 97  95 95  Resp: 16  (!) 22 17  Temp:  (!) 97.4 F (36.3 C)    TempSrc:  Oral    SpO2: 98%  99% 97%  Weight:      Height:        Intake/Output Summary (Last 24 hours) at 01/07/2019 1101 Last data filed at 01/07/2019 0920 Gross per 24 hour  Intake 446.59 ml  Output -  Net 446.59 ml   Filed Weights   01/01/19 0416 01/04/19 1330  Weight: 119.7 kg 119.9 kg    Examination:  Gen: Morbidly obese chronically ill African-American female, laying in bed, uncomfortable appearing HEENT: PERRLA, Neck supple, no JVD Lungs: Decreased breath sounds both bases CVS: S1-S2, regular rhythm, tachycardic Abd: Soft, mildly distended, obese, right-sided tenderness is improved, diminished bowel sounds Extremities: No edema Skin: no new rashes Psychiatry: Mood and affect is appropriate  Data Reviewed: I have personally reviewed following labs and imaging studies  CBC: Recent Labs  Lab 01/01/19 1838  01/03/19 0437 01/04/19 0314 01/05/19 0631 01/06/19 0430 01/07/19 0521  WBC 3.5*   < > 1.6* 0.8* 1.4* 2.4* 4.2  NEUTROABS 2.9  --  1.1* 0.2* 0.7*  --   --   HGB 6.8*   < > 8.8* 8.6* 8.6* 8.4* 9.0*  HCT 21.5*   < > 26.5* 25.9* 26.3* 26.1* 27.7*  MCV 106.4*   < > 101.9* 102.4* 102.7* 103.2* 103.0*  PLT 183   < > 154 130* 78* 45* 26*   < > = values in this interval not displayed.   Basic Metabolic Panel: Recent Labs  Lab 01/03/19 0437 01/04/19 0314 01/05/19 0631 01/06/19 0430 01/07/19 0521  NA 138 139 139 141 142  K 3.9 3.6 3.9 3.5 3.7  CL 105 104 103 105 104  CO2 25 26 26 26 24   GLUCOSE 162* 74 114* 120* 122*  BUN 12 12 10 14 15   CREATININE 0.87 0.82 0.88 0.95 0.90  CALCIUM 7.6* 7.8* 7.7* 7.9* 8.0*   GFR: Estimated Creatinine Clearance: 102.4 mL/min (by C-G formula based on SCr of 0.9 mg/dL). Liver Function Tests: Recent Labs  Lab 01/02/19 0322 01/03/19 0437 01/04/19 0314 01/05/19 0631  01/06/19 0430  AST 24 28 26 31  35  ALT 14 15 14 15 14   ALKPHOS 87 102 113 161* 157*  BILITOT 0.7 0.7 0.7 0.8 0.9  PROT 7.1 7.5 7.3 7.3 7.7  ALBUMIN 2.7* 2.8* 2.7* 2.7* 2.6*   Recent Labs  Lab 01/01/19 0541  LIPASE 14   No results for input(s): AMMONIA in the last 168 hours. Coagulation Profile: Recent Labs  Lab 01/01/19 1838  INR 1.1   Cardiac Enzymes: No results for input(s): CKTOTAL, CKMB, CKMBINDEX, TROPONINI in the last 168 hours. BNP (last 3 results) No results for input(s): PROBNP in the last 8760 hours. HbA1C: No results for input(s): HGBA1C in the last 72 hours. CBG: Recent Labs  Lab 01/06/19 1307 01/06/19 1559 01/06/19 2342 01/07/19 0351 01/07/19 0755  GLUCAP 127* 122* 91 110* 134*   Lipid Profile: No results for input(s): CHOL, HDL, LDLCALC, TRIG, CHOLHDL, LDLDIRECT in the last 72 hours. Thyroid Function Tests: No results for input(s): TSH, T4TOTAL, FREET4, T3FREE, THYROIDAB in the last 72 hours. Anemia Panel: No results for input(s): VITAMINB12, FOLATE, FERRITIN, TIBC, IRON, RETICCTPCT in the last 72 hours. Sepsis Labs: Recent Labs  Lab 01/01/19 1659 01/01/19 1838  PROCALCITON  --  0.15  LATICACIDVEN 1.0 1.0    Recent Results (from the past 240 hour(s))  SARS CORONAVIRUS 2 (TAT 6-24 HRS) Nasopharyngeal Nasopharyngeal Swab     Status: None   Collection Time: 01/01/19  6:43 AM   Specimen: Nasopharyngeal Swab  Result Value Ref Range Status   SARS Coronavirus 2 NEGATIVE NEGATIVE Final    Comment: (NOTE) SARS-CoV-2 target nucleic acids are NOT DETECTED. The SARS-CoV-2 RNA is generally detectable in upper and lower respiratory specimens during the acute phase of infection. Negative results do not preclude SARS-CoV-2 infection, do not rule out co-infections with other pathogens, and should not be used as the sole basis for treatment or other patient management decisions. Negative results must be combined with clinical observations, patient  history, and epidemiological information. The expected result is Negative. Fact Sheet for Patients: SugarRoll.be Fact Sheet for Healthcare Providers: https://www.woods-mathews.com/ This test is not yet approved or cleared by the [place] FDA and  has been authorized for detection and/or diagnosis of SARS-CoV-2 by FDA under an Emergency Use Authorization (EUA). This EUA will remain  in effect (meaning this test can be used) for the duration of the COVID-19 declaration under Section 56 4(b)(1) of the Act, 21 U.S.C. section 360bbb-3(b)(1), unless the authorization is terminated or revoked sooner. Performed at [facility], [place] [address], [place], [place] [address]   Urine culture     Status: None   Collection Time: 01/01/19  9:31 AM   Specimen: Urine, Catheterized  Result Value Ref Range Status   Specimen Description   Final    URINE, CATHETERIZED Performed at [facility] [address], [place], [place] [address]    Special Requests   Final    NONE Performed at [facility], [place] [address] [place], [place] [address]    Culture   Final    NO GROWTH Performed at [facility], [place] [address] [place], [place] [address]    Report Status 01/03/2019 FINAL  Final  Culture, blood (x 2)     Status: None   Collection Time: 01/01/19  9:03 PM   Specimen: BLOOD  Result Value Ref Range Status   Specimen Description   Final    BLOOD LEFT ANTECUBITAL Performed at [facility] [address], [place], [place] [address]    Special Requests   Final    BOTTLES DRAWN AEROBIC AND ANAEROBIC Blood Culture adequate volume Performed at [facility] [address] [place], [place] [address]    Culture   Final    NO GROWTH 5 DAYS Performed at [facility], [place] [address] [place], [place] [address]    Report Status 01/07/2019 FINAL  Final  Culture,  blood (x 2)     Status: None   Collection Time: 01/01/19  9:03 PM   Specimen: BLOOD  Result Value Ref Range Status   Specimen Description   Final    BLOOD RIGHT ANTECUBITAL Performed at [facility] [address], [place], [place] [address]    Special Requests   Final    BOTTLES DRAWN AEROBIC AND ANAEROBIC Blood Culture adequate volume Performed at [facility] [address], [place], [place] [address]    Culture   Final    NO GROWTH 5 DAYS Performed at [facility], [place] [address] [place],  [address]    Report Status 01/07/2019 FINAL  Final  Culture, body fluid-bottle     Status: None   Collection Time: 01/02/19 12:40 PM   Specimen: Pleura  Result Value Ref Range Status   Specimen Description PLEURAL LEFT  Final   Special Requests NONE  Final   Culture   Final    NO GROWTH 5 DAYS Performed at [facility], [address], [place], [place] [address]    Report Status 01/07/2019 FINAL  Final  Gram stain     Status: None   Collection Time: 01/02/19 12:40 PM   Specimen: Pleura  Result Value Ref Range Status   Specimen Description PLEURAL LEFT  Final   Special Requests NONE  Final   Gram Stain   Final    MODERATE WBC PRESENT,BOTH PMN AND MONONUCLEAR NO ORGANISMS SEEN Performed at [facility], [address] [place], [place] [address]    Report Status 01/02/2019 FINAL  Final  MRSA PCR Screening     Status: Abnormal   Collection Time: 01/04/19  1:39 PM   Specimen: Nasal Mucosa; Nasopharyngeal  Result Value Ref Range Status   MRSA by PCR POSITIVE (A) NEGATIVE Final    Comment:        The GeneXpert MRSA Assay (FDA approved for NASAL specimens only), is one component of a comprehensive MRSA colonization surveillance program. It is not intended to diagnose MRSA infection nor to guide or monitor treatment for MRSA infections. RESULT CALLED TO, READ BACK BY AND VERIFIED WITH: Lorna Dibble [id number] @ [phone]  [place] Performed at [facility] [address] [place], [place] [address]      Radiology Studies: No results found.  Scheduled Meds: . sodium chloride   Intravenous Once  . aspirin EC  81 mg Oral Daily  . atorvastatin  40 mg Oral q1800  . Chlorhexidine Gluconate Cloth  6 each Topical Q0600  . docusate sodium  100 mg Oral BID  . DULoxetine  60 mg Oral Daily  . feeding supplement (ENSURE ENLIVE)  237 mL Oral BID BM  . feeding supplement (GLUCERNA SHAKE)  237 mL Oral BID BM  . hydrALAZINE  25 mg Oral Q6H  . insulin aspart  0-9 Units Subcutaneous Q4H  . insulin glargine  20 Units Subcutaneous QAC breakfast  . levothyroxine  150 mcg Oral QAC breakfast  . mouth rinse  15 mL Mouth Rinse BID  . metoprolol succinate  25 mg Oral QPM  . metoprolol succinate  50 mg Oral Daily  . mupirocin ointment  1 application Nasal BID  . pantoprazole  40 mg Oral Daily  . polyethylene glycol  17 g Oral BID  . senna-docusate  2 tablet Oral BID  . Tbo-filgastrim (GRANIX) SQ  480 mcg Subcutaneous q1800   Continuous Infusions: . sodium chloride    . sodium chloride Stopped (01/07/19 0455)  . piperacillin-tazobactam (ZOSYN)  IV Stopped (01/07/19 0920)     LOS: 5 days   Domenic Polite, MD [facility]  01/07/2019, 11:01 AM

## 2019-01-07 NOTE — Progress Notes (Signed)
CC:  Subjective: Pt threw up this AM.    Objective: Vital signs in last 24 hours: Temp:  [97.4 F (36.3 C)-97.6 F (36.4 C)] 97.4 F (36.3 C) (10/04 0840) Pulse Rate:  [90-116] 99 (10/04 0800) Resp:  [17-24] 20 (10/04 0800) BP: (124-184)/(69-118) 162/108 (10/04 0758) SpO2:  [95 %-100 %] 97 % (10/04 0800)  Intake/Output from previous day: 10/03 0701 - 10/04 0700 In: 406 [I.V.:251.2; IV Piggyback:154.8] Out: -  Intake/Output this shift: No intake/output data recorded.  Sleeping, arousable.   Breathing comfortably. CV - regular Abd - soft, non distended, non tender.     Lab Results:  Recent Labs    01/06/19 0430 01/07/19 0521  WBC 2.4* 4.2  HGB 8.4* 9.0*  HCT 26.1* 27.7*  PLT 45* 26*    BMET Recent Labs    01/06/19 0430 01/07/19 0521  NA 141 142  K 3.5 3.7  CL 105 104  CO2 26 24  GLUCOSE 120* 122*  BUN 14 15  CREATININE 0.95 0.90  CALCIUM 7.9* 8.0*   PT/INR No results for input(s): LABPROT, INR in the last 72 hours.  Recent Labs  Lab 01/02/19 0322 01/03/19 0437 01/04/19 0314 01/05/19 0631 01/06/19 0430  AST 24 28 26 31  35  ALT 14 15 14 15 14   ALKPHOS 87 102 113 161* 157*  BILITOT 0.7 0.7 0.7 0.8 0.9  PROT 7.1 7.5 7.3 7.3 7.7  ALBUMIN 2.7* 2.8* 2.7* 2.7* 2.6*     Lipase     Component Value Date/Time   LIPASE 14 01/01/2019 0541     Medications: . sodium chloride   Intravenous Once  . aspirin EC  81 mg Oral Daily  . atorvastatin  40 mg Oral q1800  . Chlorhexidine Gluconate Cloth  6 each Topical Q0600  . docusate sodium  100 mg Oral BID  . DULoxetine  60 mg Oral Daily  . feeding supplement (ENSURE ENLIVE)  237 mL Oral BID BM  . feeding supplement (GLUCERNA SHAKE)  237 mL Oral BID BM  . hydrALAZINE  25 mg Oral Q6H  . insulin aspart  0-9 Units Subcutaneous Q4H  . insulin glargine  20 Units Subcutaneous QAC breakfast  . levothyroxine  150 mcg Oral QAC breakfast  . mouth rinse  15 mL Mouth Rinse BID  . metoprolol succinate  25  mg Oral QPM  . metoprolol succinate  50 mg Oral Daily  . mupirocin ointment  1 application Nasal BID  . pantoprazole  40 mg Oral Daily  . polyethylene glycol  17 g Oral BID  . senna-docusate  2 tablet Oral BID  . Tbo-filgastrim (GRANIX) SQ  480 mcg Subcutaneous q1800   . sodium chloride    . sodium chloride Stopped (01/07/19 0455)  . piperacillin-tazobactam (ZOSYN)  IV 12.5 mL/hr at 01/07/19 0600    Assessment/Plan  Metastatic lung cancer - last dose of chemo on 9/23 Nonischemic cardiomyopathy HTN Malignant pleural effusion s/p thoracentesis  Leukopenia - WBC 3.8(9/28)>>3.1(9/29)>>1.6(9/30)>>0.8(10/1)<<2.4 (10/3)<<4.2 (10/4) Anemia of chronic disease  Abdominal pain, cholelithiasis No significant change.  Probable chronic cholecystitis, but she is very high risk for surgical intervention at this time given no evidence of acute infection, as well as recent avastin (9/23) and pancytopenia.  The pancytopenia is improving.  No dramatic imaging findings and hida shows filling of gallbladder, albeit slow.  Symptomatic treatment would be recommended at this time.  She can follow up as an outpatient to discuss elective cholecystectomy in the future once she has  completed her chemo etc as needed.  Can try liquids as able. Non tender, today, but still having pain and ansuea.    FEN: IV fluids, resume full liquids ID:  Zosyn 9/28>> day 7 [id number] Follow up:  TBD  She had repeat CT a/p 10/1 which showed no wall thickening or adjacent inflammatory changes.  Full liquids as tolerated.  Can finish out abx course   Milus Height, MD [facility] Surgical Oncology, General Surgery, Trauma and [facility], [place] [phone] Check [email or web address], password [facility] for coverage night/weekend

## 2019-01-07 NOTE — Progress Notes (Addendum)
CRITICAL VALUE ALERT  Critical Value:  Platelets 26  Date & Time Notied:  01/07/2019 0655  Provider Notified: Silas [place]  Orders Received/Actions taken: Awaiting

## 2019-01-07 NOTE — Progress Notes (Signed)
Marilyn [name] 1238 has refused all po liquids and meds. Dr Broadus John knows this, and held lantus and s/s insulin if CBG <200. She has been sleeping much of the day, but will answer and follow commands. She hasn't vomited this shift.

## 2019-01-08 LAB — BASIC METABOLIC PANEL
Anion gap: 11 (ref 5–15)
BUN: 21 mg/dL — ABNORMAL HIGH (ref 6–20)
CO2: 26 mmol/L (ref 22–32)
Calcium: 8 mg/dL — ABNORMAL LOW (ref 8.9–10.3)
Chloride: 106 mmol/L (ref 98–111)
Creatinine, Ser: 0.96 mg/dL (ref 0.44–1.00)
GFR calc Af Amer: >60 mL/min (ref >60)
GFR calc non Af Amer: >60 mL/min (ref >60)
Glucose, Bld: 147 mg/dL — ABNORMAL HIGH (ref 70–99)
Potassium: 3.2 mmol/L — ABNORMAL LOW (ref 3.5–5.1)
Sodium: 143 mmol/L (ref 135–145)

## 2019-01-08 LAB — CBC WITH DIFFERENTIAL/PLATELET
Abs Immature Granulocytes: 0.2 10*3/uL — ABNORMAL HIGH (ref 0.00–0.07)
Band Neutrophils: 6 %
Basophils Absolute: 0 10*3/uL (ref 0.0–0.1)
Basophils Relative: 0 %
Eosinophils Absolute: 0.1 10*3/uL (ref 0.0–0.5)
Eosinophils Relative: 1 %
HCT: 25.3 % — ABNORMAL LOW (ref 36.0–46.0)
Hemoglobin: 8.3 g/dL — ABNORMAL LOW (ref 12.0–15.0)
Lymphocytes Relative: 5 %
Lymphs Abs: 0.3 10*3/uL — ABNORMAL LOW (ref 0.7–4.0)
MCH: 33.3 pg (ref 26.0–34.0)
MCHC: 32.8 g/dL (ref 30.0–36.0)
MCV: 101.6 fL — ABNORMAL HIGH (ref 80.0–100.0)
Metamyelocytes Relative: 2 %
Monocytes Absolute: 0.1 10*3/uL (ref 0.1–1.0)
Monocytes Relative: 1 %
Myelocytes: 1 %
Neutro Abs: 5.2 10*3/uL (ref 1.7–7.7)
Neutrophils Relative %: 84 %
Platelets: 14 10*3/uL — CL (ref 150–400)
RBC: 2.49 MIL/uL — ABNORMAL LOW (ref 3.87–5.11)
RDW: 19.2 % — ABNORMAL HIGH (ref 11.5–15.5)
WBC: 5.8 10*3/uL (ref 4.0–10.5)
nRBC: 0 % (ref 0.0–0.2)

## 2019-01-08 LAB — GLUCOSE, CAPILLARY
Glucose-Capillary: 104 mg/dL — ABNORMAL HIGH (ref 70–99)
Glucose-Capillary: 140 mg/dL — ABNORMAL HIGH (ref 70–99)
Glucose-Capillary: 143 mg/dL — ABNORMAL HIGH (ref 70–99)
Glucose-Capillary: 143 mg/dL — ABNORMAL HIGH (ref 70–99)
Glucose-Capillary: 144 mg/dL — ABNORMAL HIGH (ref 70–99)
Glucose-Capillary: 164 mg/dL — ABNORMAL HIGH (ref 70–99)
Glucose-Capillary: 179 mg/dL — ABNORMAL HIGH (ref 70–99)

## 2019-01-08 LAB — HEPARIN INDUCED PLATELET AB (HIT ANTIBODY): Heparin Induced Plt Ab: 0.232 OD (ref 0.000–0.400)

## 2019-01-08 LAB — IMMATURE PLATELET FRACTION: Immature Platelet Fraction: 7.1 % (ref 1.2–8.6)

## 2019-01-08 IMAGING — US US BIOPSY
1 series · 13 of 22 positions shown · non-contrast
Comparison: none

INDICATION: 51-year-old female with a history of right flank mass concerning for
metastatic lung carcinoma

[Series 1: us biopsy · 0.14mm/px · 13 of 22 slices shown]
[im 1/22]
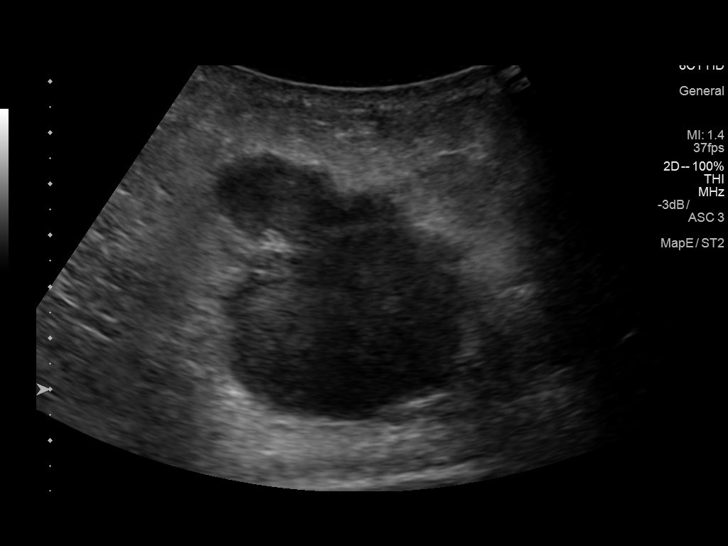
[im 3/22]
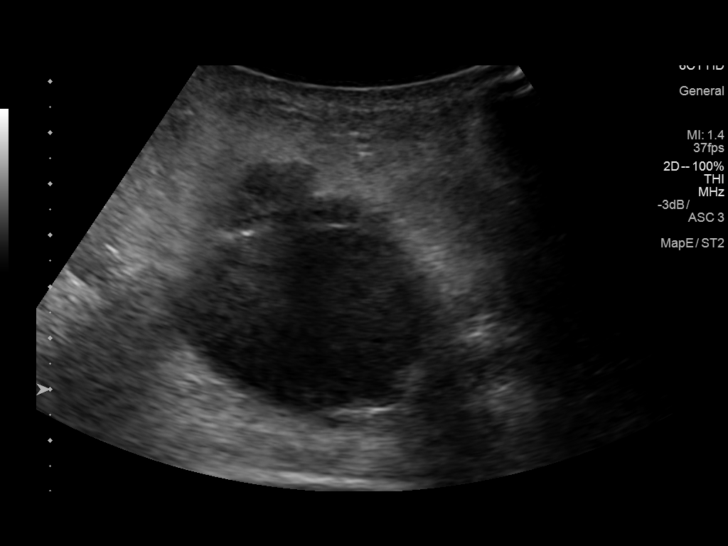
[im 5/22]
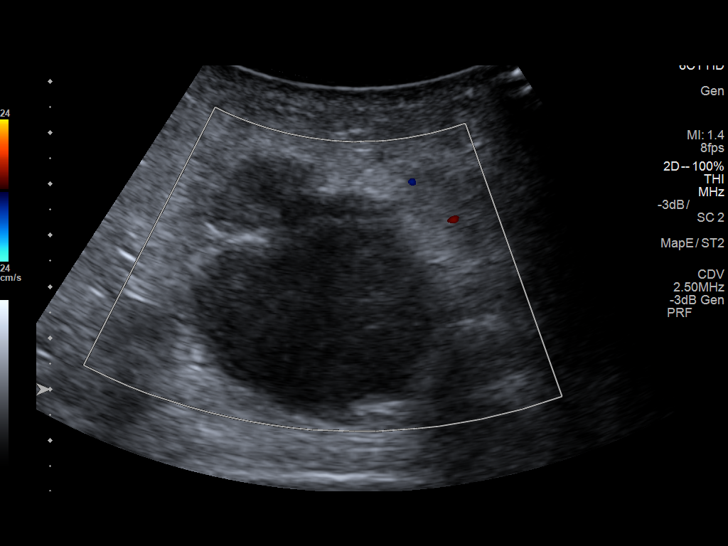
[im 6/22]
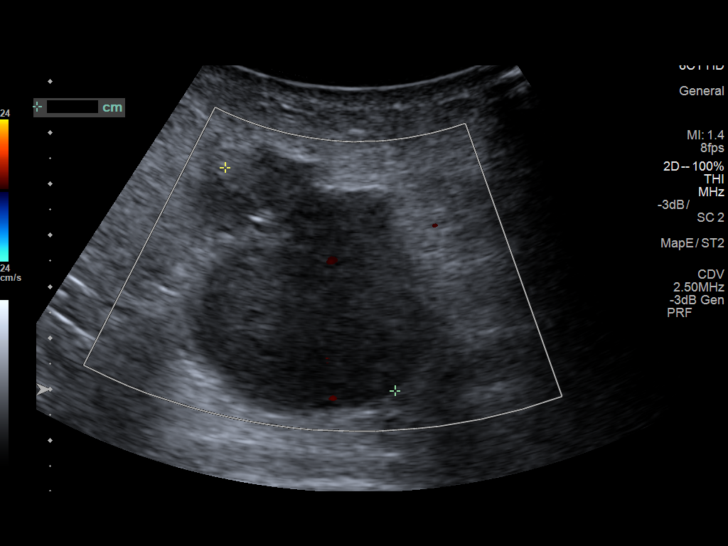
[im 8/22]
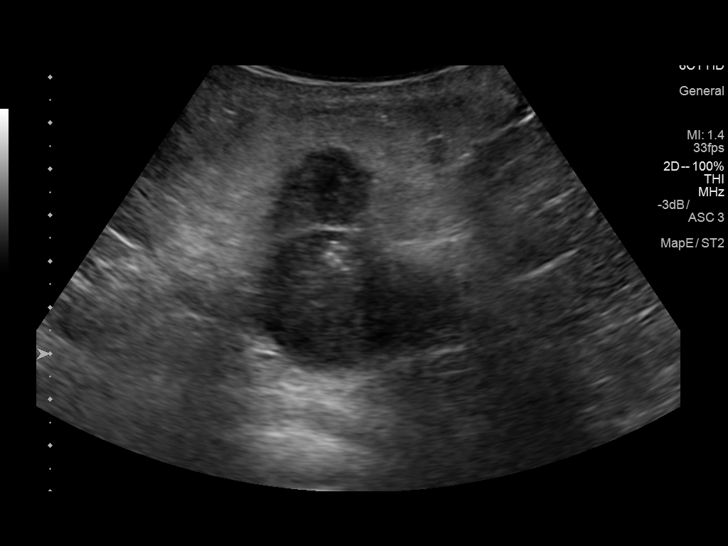
[im 10/22]
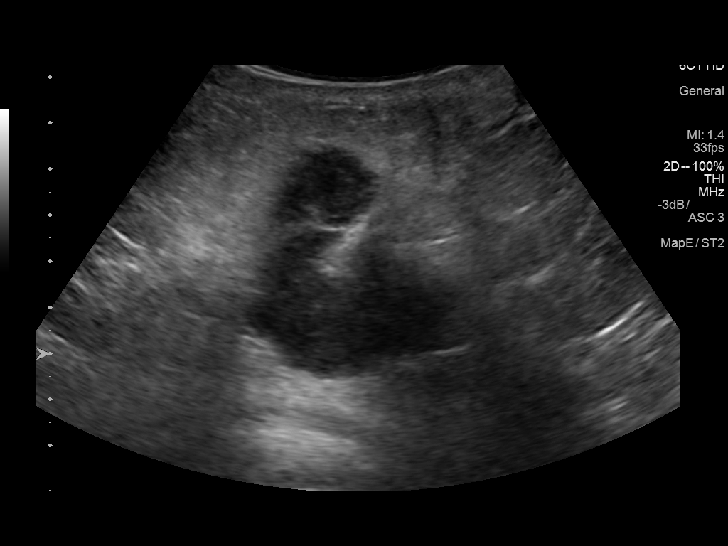
[im 12/22]
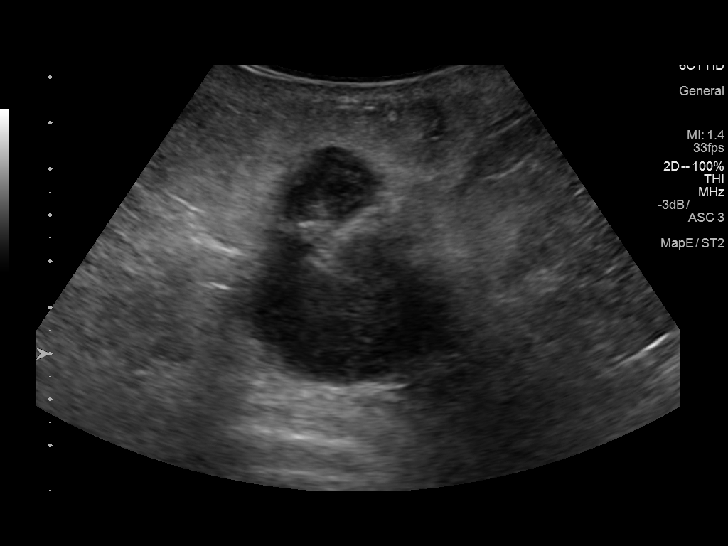
[im 13/22]
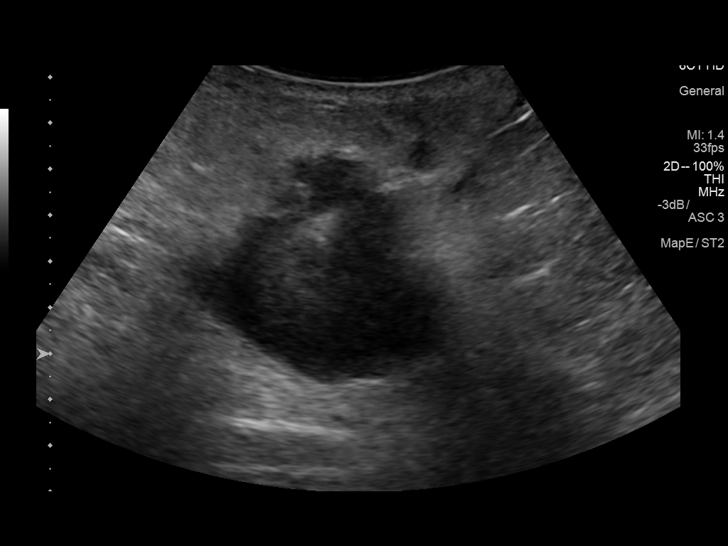
[im 15/22]
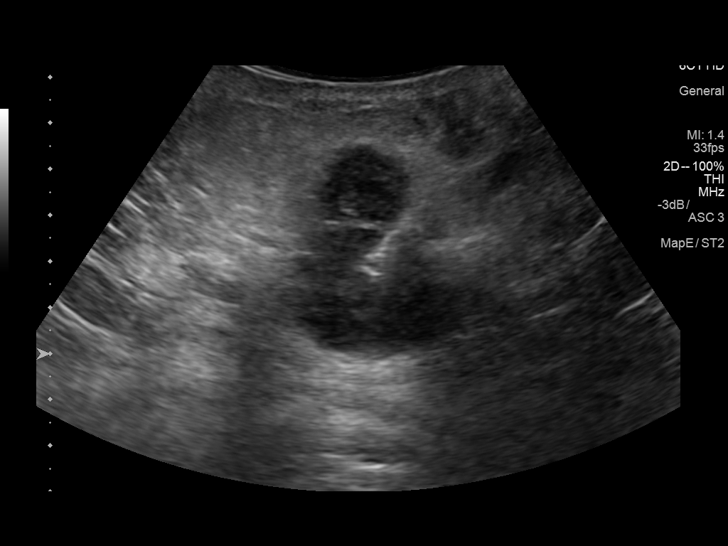
[im 17/22]
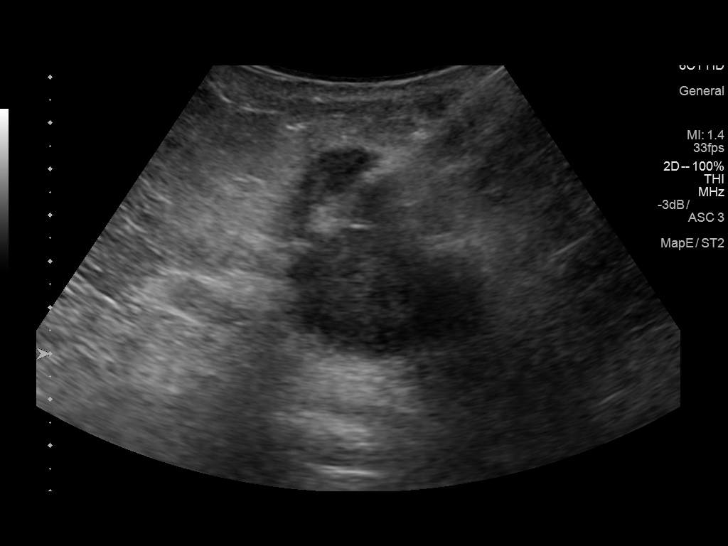
[im 18/22]
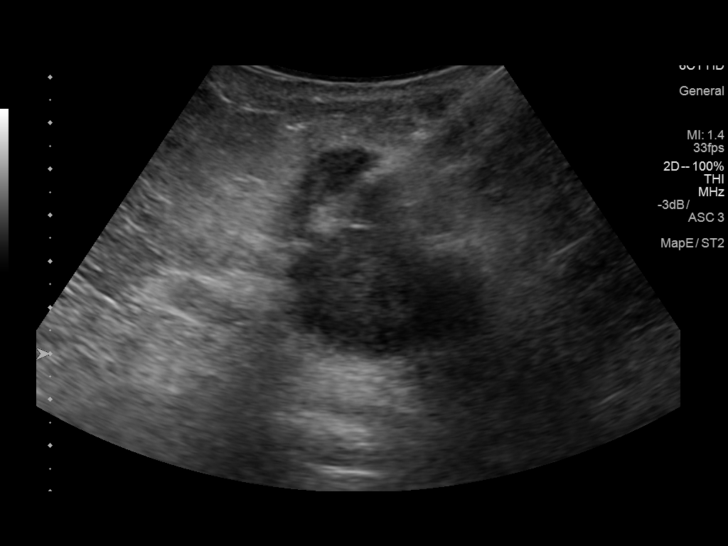
[im 20/22]
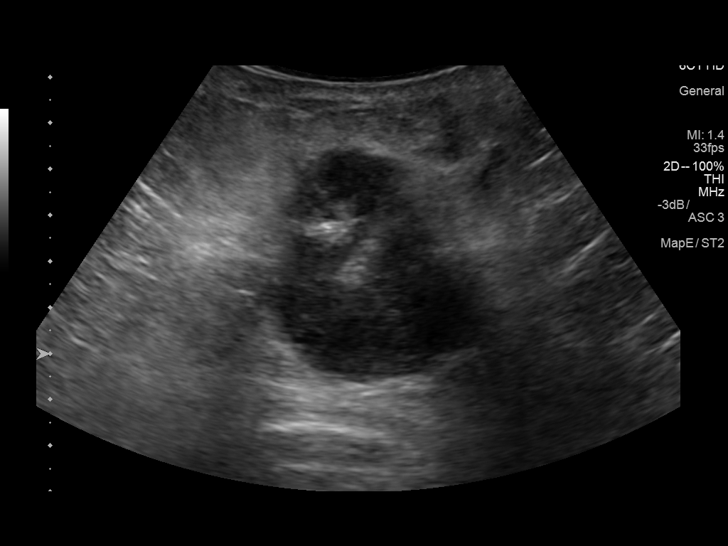
[im 22/22]
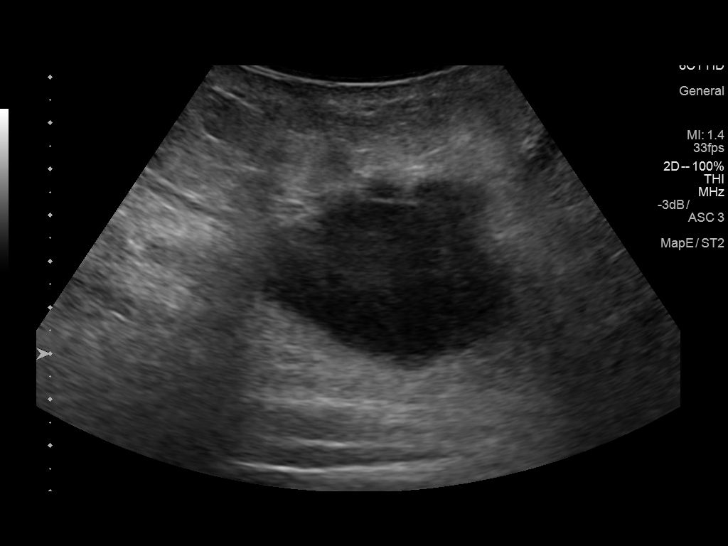

[13 of 22 positions shown; findings below may reference images not displayed]

EXAM:
ULTRASOUND-GUIDED BIOPSY RIGHT FLANK MASS

MEDICATIONS:
None.

ANESTHESIA/SEDATION:
Moderate (conscious) sedation was employed during this procedure. A
total of Versed 1.0 mg and Fentanyl 50 mcg was administered
intravenously.

Moderate Sedation Time: 10 minutes. The patient's level of
consciousness and vital signs were monitored continuously by
radiology nursing throughout the procedure under my direct
supervision.

FLUOROSCOPY TIME:  Ultrasound

COMPLICATIONS:
None

PROCEDURE:
Informed written consent was obtained from the patient after a
thorough discussion of the procedural risks, benefits and
alternatives. All questions were addressed. Maximal Sterile Barrier
Technique was utilized including caps, mask, sterile gowns, sterile
gloves, sterile drape, hand hygiene and skin antiseptic. A timeout
was performed prior to the initiation of the procedure.

Patient positioned supine position on the ultrasound gantry. Images
of the right flank were performed with images stored and sent to
PACs.

The patient was then prepped and draped in the usual sterile
fashion. The skin and subcutaneous tissues were generously
infiltrated 1% lidocaine for local anesthesia. A small stab incision
was made with 11 blade scalpel. Using ultrasound guidance, multiple
18 gauge core biopsy were acquired and placed into formalin
solution.

Final image was stored and a sterile bandage was placed.

Patient tolerated the procedure well and remained hemodynamically
stable throughout.

No complications were encountered and no significant blood loss.
IMPRESSION: Status post ultrasound-guided biopsy of right-sided flank mass with
multiple 18 gauge core biopsy. Tissue specimen sent to pathology for
complete histopathologic analysis.

## 2019-01-08 MED ORDER — CIPROFLOXACIN IN D5W 400 MG/200ML IV SOLN
400.0000 mg | Freq: Two times a day (BID) | INTRAVENOUS | Status: DC
  Administered 2019-01-08 – 2019-01-12 (×8): 400 mg via INTRAVENOUS
  Filled 2019-01-08 (×8): qty 200

## 2019-01-08 MED ORDER — POTASSIUM CHLORIDE 20 MEQ/15ML (10%) PO SOLN: 40.0000 meq | Freq: Once | ORAL | Status: DC

## 2019-01-08 MED ORDER — VITAMIN B-12 1000 MCG PO TABS
1000.0000 ug | ORAL_TABLET | Freq: Every day | ORAL | Status: DC
  Administered 2019-01-09 – 2019-01-20 (×10): 1000 ug via ORAL
  Filled 2019-01-08 (×11): qty 1

## 2019-01-08 MED ORDER — BISACODYL 10 MG RE SUPP
10.0000 mg | Freq: Once | RECTAL | Status: AC
  Administered 2019-01-09: 10 mg via RECTAL
  Filled 2019-01-08: qty 1

## 2019-01-08 MED ORDER — FOLIC ACID 1 MG PO TABS
1.0000 mg | ORAL_TABLET | Freq: Every day | ORAL | Status: DC
  Administered 2019-01-09 – 2019-01-20 (×10): 1 mg via ORAL
  Filled 2019-01-08 (×11): qty 1

## 2019-01-08 MED ORDER — METRONIDAZOLE IN NACL 5-0.79 MG/ML-% IV SOLN
500.0000 mg | Freq: Three times a day (TID) | INTRAVENOUS | Status: DC
  Administered 2019-01-08 – 2019-01-12 (×12): 500 mg via INTRAVENOUS
  Filled 2019-01-08 (×12): qty 100

## 2019-01-08 NOTE — Plan of Care (Signed)
Pt A&O x3, MAE(=), denies pain, and is currently refusing all p.o. medications. The importance of her medications was explained to Marilyn [name]. She stated in a loud voice. "I will take them at 2pm.) Pt appears depressed and is very withdrawn. No additional needs at this time.

## 2019-01-08 NOTE — Progress Notes (Signed)
PT Cancellation Note  Patient Details Name: Marilyn [name] MRN: [id number] DOB: 07/07/1965   Cancelled Treatment:    Reason Eval/Treat Not Completed: Patient declined, no reason specified. Will check back another day.   Weston Anna, PT Acute Rehabilitation Services Pager: [phone] Office: [phone]

## 2019-01-08 NOTE — Progress Notes (Signed)
Pt is still refusing food and medications. Will notify MD for recommendations.

## 2019-01-08 NOTE — Progress Notes (Signed)
Pt A&O x3, VSS, Pt is verbal but appears withdrawn and depressed. Pt prefers to be called Marilyn [name]. She is refusing all medications, and was advised and education on the importance of the timeliness of medications. She declined. Pt was also asked to take oral Potassium as prescribed. She refused this as well. Will continue to monitor.

## 2019-01-08 NOTE — Progress Notes (Addendum)
PROGRESS NOTE    Marilyn [name]  QQV:[id number] DOB: 09/29/1965 DOA: 01/01/2019 PCP: [name], FNP  Brief Narrative:  53 y.o. female with Past medical history of metastatic adenocarcinoma, nonischemic cardiomyopathy, HTN, hypothyroidism, malignant pleural effusion S/P thoracentesis x3, hypothyroidism. Patient presented with complaints of right-sided abdominal pain.  She reported that this pain has been ongoing for 3 months but in last 3 days this is worsened. -On chemotherapy, last infusion was 9/23 of Avastin, Alimta, carboplatin -Initial CT and ultrasound noted gallbladder wall thickening, gallstones, HIDA scan noted chronic cholecystitis, situation was complicated by neutropenia from chemotherapy -Followed by general surgery, poor surgical candidate given metastatic cancer -Hospitalization complicated by constipation and severe thrombocytopenia  Assessment & Plan:   1.    Acute on chronic cholecystitis  --Admitted with severe right upper quadrant abdominal pain, worsening from baseline Initial imaging studies with CT and ultrasound noted gallbladder wall thickening, gallstones, HIDA scan showed evidence of chronic cholecystitis   -Treated initially with bowel rest, IV Zosyn, complicated by neutropenia from recent chemotherapy -Repeat CT abdomen pelvis 10/1 without acute findings, gallbladder wall thickening appears to have improved,  -Pain appears to be reportedly a little better, continues to have nausea, unable to eat or drink very much -appreciate general surgery input, she is not a surgical candidate given metastatic cancer, recent Chemotherapy especially Avastin on 9/23 -On day 6 of empiric Zosyn  -Continue liquid diet as tolerated, IV morphine every 3 hours as needed -Discussed with IR last week, PERC drain on an option since her gallbladder is contracted -Continue laxatives, encouraged trying liquids today, Dulcolax suppository  2.  Malignant recurrent right pleural  effusion -Patient has metastatic adenocarcinoma of the lung and has recurrent right malignant effusion -Pt is s/p thoracentesis 9/29, yielding 920cc fluid, cytology positive for malignant effusion -Follow, will likely need thoracentesis again sometime next week -Repeat x-ray tomorrow  3.  Metastatic non-small cell lung cancer. -Followed by Dr. Irene Limbo -Currently on chemotherapy with carboplatin, Alimta and Avastin.  Last cycle on 12/27/2018 -Oncology following  4.   Thrombocytopenia  -Due to sepsis, chemotherapy, worsened by recent heparin use  -Heparin discontinued 10/3 -Unfortunately platelet count is trended down further, ordered HIT antibody panel and SRA yesterday, will follow-up results -Transfuse platelets if drops less than 10,000 or if active bleeding -Called and discussed with patient's oncologist Dr.Kale, he feels that lingering effects from recent chemotherapy could be contributing to her worsening thrombocytopenia as well and beta-lactam's, Zosyn potentially also worsening this, will stop IV PIP tazo, add Cipro Flagyl, check immature platelet fraction -Monitor CBC  5.  Depression. -Continue on cymbalta  6.    Acute on chronic diastolic dysfunction. -Prior history of nonischemic cardiomyopathy showed normal EF -Given Lasix 2 days ago, monitor, dose periodically  7. Type 2 Diabetes Mellitus, poorly controled without complication -last hemoglobin A1c was 10. -Continue low-dose Lantus, CBGs are stable  DVT prophylaxis: SCDs Code Status: Full Family Communication: No family at bedside, called and updated mother 10/1 Disposition Plan: Keep in stepdown today  Consultants:   General Surgery  Oncology  IR  Procedures:   Thoracentesis 9/29  Antimicrobials: Anti-infectives (From admission, onward)   Start     Dose/Rate Route Frequency Ordered Stop   01/06/19 1215  ciprofloxacin (CIPRO) tablet 500 mg  Status:  Discontinued     500 mg Oral 2 times daily 01/06/19  1200 01/06/19 1201   01/01/19 2200  piperacillin-tazobactam (ZOSYN) IVPB 3.375 g     3.375 g 12.5  mL/hr over 240 Minutes Intravenous Every 8 hours 01/01/19 1450     01/01/19 1500  piperacillin-tazobactam (ZOSYN) IVPB 3.375 g     3.375 g 100 mL/hr over 30 Minutes Intravenous  Once 01/01/19 1450 01/01/19 2045      Subjective: -It appears that her pain is a slightly better today, has not eaten or drank anything all day yesterday  Objective: Vitals:   01/08/19 0400 01/08/19 0500 01/08/19 0600 01/08/19 0800  BP: (!) 148/89 (!) 161/95 (!) 169/115   Pulse:      Resp: 19 (!) 9 15   Temp: 97.7 F (36.5 C)   97.6 F (36.4 C)  TempSrc: Oral   Axillary  SpO2: 93%     Weight:      Height:        Intake/Output Summary (Last 24 hours) at 01/08/2019 1109 Last data filed at 01/08/2019 0600 Gross per 24 hour  Intake 180.08 ml  Output 500 ml  Net -319.92 ml   Filed Weights   01/01/19 0416 01/04/19 1330  Weight: 119.7 kg 119.9 kg    Examination:  Gen: Morbidly obese, chronically ill African-American female laying in bed, slightly uncomfortable appearing, awake alert oriented to self and place HEENT: PERRLA, Neck supple, no JVD Lungs: Decreased breath sounds at both bases  CVS: S1-S2, regular rhythm, tachycardic Abd: Soft, obese, mildly distended, right-sided tenderness has improved, diminished bowel sounds Extremities: No edema Skin: no new rashes Psychiatry: Flat affect  Data Reviewed: I have personally reviewed following labs and imaging studies  CBC: Recent Labs  Lab 01/01/19 1838  01/03/19 0437 01/04/19 0314 01/05/19 0631 01/06/19 0430 01/07/19 0521 01/08/19 0501  WBC 3.5*   < > 1.6* 0.8* 1.4* 2.4* 4.2 5.8  NEUTROABS 2.9  --  1.1* 0.2* 0.7*  --   --  5.2  HGB 6.8*   < > 8.8* 8.6* 8.6* 8.4* 9.0* 8.3*  HCT 21.5*   < > 26.5* 25.9* 26.3* 26.1* 27.7* 25.3*  MCV 106.4*   < > 101.9* 102.4* 102.7* 103.2* 103.0* 101.6*  PLT 183   < > 154 130* 78* 45* 26* 14*   < > = values  in this interval not displayed.   Basic Metabolic Panel: Recent Labs  Lab 01/04/19 0314 01/05/19 0631 01/06/19 0430 01/07/19 0521 01/08/19 0501  NA 139 139 141 142 143  K 3.6 3.9 3.5 3.7 3.2*  CL 104 103 105 104 106  CO2 26 26 26 24 26   GLUCOSE 74 114* 120* 122* 147*  BUN 12 10 14 15  21*  CREATININE 0.82 0.88 0.95 0.90 0.96  CALCIUM 7.8* 7.7* 7.9* 8.0* 8.0*   GFR: Estimated Creatinine Clearance: 96 mL/min (by C-G formula based on SCr of 0.96 mg/dL). Liver Function Tests: Recent Labs  Lab 01/02/19 0322 01/03/19 0437 01/04/19 0314 01/05/19 0631 01/06/19 0430  AST 24 28 26 31  35  ALT 14 15 14 15 14   ALKPHOS 87 102 113 161* 157*  BILITOT 0.7 0.7 0.7 0.8 0.9  PROT 7.1 7.5 7.3 7.3 7.7  ALBUMIN 2.7* 2.8* 2.7* 2.7* 2.6*   No results for input(s): LIPASE, AMYLASE in the last 168 hours. No results for input(s): AMMONIA in the last 168 hours. Coagulation Profile: Recent Labs  Lab 01/01/19 1838  INR 1.1   Cardiac Enzymes: No results for input(s): CKTOTAL, CKMB, CKMBINDEX, TROPONINI in the last 168 hours. BNP (last 3 results) No results for input(s): PROBNP in the last 8760 hours. HbA1C: No results for input(s): HGBA1C in  the last 72 hours. CBG: Recent Labs  Lab 01/07/19 1603 01/07/19 2029 01/07/19 2318 01/08/19 0403 01/08/19 0807  GLUCAP 147* 129* 135* 144* 140*   Lipid Profile: No results for input(s): CHOL, HDL, LDLCALC, TRIG, CHOLHDL, LDLDIRECT in the last 72 hours. Thyroid Function Tests: No results for input(s): TSH, T4TOTAL, FREET4, T3FREE, THYROIDAB in the last 72 hours. Anemia Panel: No results for input(s): VITAMINB12, FOLATE, FERRITIN, TIBC, IRON, RETICCTPCT in the last 72 hours. Sepsis Labs: Recent Labs  Lab 01/01/19 1659 01/01/19 1838  PROCALCITON  --  0.15  LATICACIDVEN 1.0 1.0    Recent Results (from the past 240 hour(s))  SARS CORONAVIRUS 2 (TAT 6-24 HRS) Nasopharyngeal Nasopharyngeal Swab     Status: None   Collection Time: 01/01/19   6:43 AM   Specimen: Nasopharyngeal Swab  Result Value Ref Range Status   SARS Coronavirus 2 NEGATIVE NEGATIVE Final    Comment: (NOTE) SARS-CoV-2 target nucleic acids are NOT DETECTED. The SARS-CoV-2 RNA is generally detectable in upper and lower respiratory specimens during the acute phase of infection. Negative results do not preclude SARS-CoV-2 infection, do not rule out co-infections with other pathogens, and should not be used as the sole basis for treatment or other patient management decisions. Negative results must be combined with clinical observations, patient history, and epidemiological information. The expected result is Negative. Fact Sheet for Patients: SugarRoll.be Fact Sheet for Healthcare Providers: https://www.woods-mathews.com/ This test is not yet approved or cleared by the [place] FDA and  has been authorized for detection and/or diagnosis of SARS-CoV-2 by FDA under an Emergency Use Authorization (EUA). This EUA will remain  in effect (meaning this test can be used) for the duration of the COVID-19 declaration under Section 56 4(b)(1) of the Act, 21 U.S.C. section 360bbb-3(b)(1), unless the authorization is terminated or revoked sooner. Performed at [facility], [place] [address] [place], [place] [address]   Urine culture     Status: None   Collection Time: 01/01/19  9:31 AM   Specimen: Urine, Catheterized  Result Value Ref Range Status   Specimen Description   Final    URINE, CATHETERIZED Performed at [place] [address], [place], [place] [address]    Special Requests   Final    NONE Performed at [facility], [place] [address], [place], [place] [address]    Culture   Final    NO GROWTH Performed at [facility], [place] [address] [place], [place] [address]    Report Status 01/03/2019 FINAL  Final  Culture, blood (x 2)     Status: None   Collection  Time: 01/01/19  9:03 PM   Specimen: BLOOD  Result Value Ref Range Status   Specimen Description   Final    BLOOD LEFT ANTECUBITAL Performed at [place] [address] [place], [place] [address]    Special Requests   Final    BOTTLES DRAWN AEROBIC AND ANAEROBIC Blood Culture adequate volume Performed at [place] [address] [place], [place] [address]    Culture   Final    NO GROWTH 5 DAYS Performed at [facility], [place] [address], [place],  [address]    Report Status 01/07/2019 FINAL  Final  Culture, blood (x 2)     Status: None   Collection Time: 01/01/19  9:03 PM   Specimen: BLOOD  Result Value Ref Range Status   Specimen Description  Final    BLOOD RIGHT ANTECUBITAL Performed at [place] [address] [place], [place] [address]    Special Requests   Final    BOTTLES DRAWN AEROBIC AND ANAEROBIC Blood Culture adequate volume Performed at [place] [address], [place], [place] [address]    Culture   Final    NO GROWTH 5 DAYS Performed at [facility], [place] [address] [place], [place] [address]    Report Status 01/07/2019 FINAL  Final  Culture, body fluid-bottle     Status: None   Collection Time: 01/02/19 12:40 PM   Specimen: Pleura  Result Value Ref Range Status   Specimen Description PLEURAL LEFT  Final   Special Requests NONE  Final   Culture   Final    NO GROWTH 5 DAYS Performed at [facility], [address], [place], [place] [address]    Report Status 01/07/2019 FINAL  Final  Gram stain     Status: None   Collection Time: 01/02/19 12:40 PM   Specimen: Pleura  Result Value Ref Range Status   Specimen Description PLEURAL LEFT  Final   Special Requests NONE  Final   Gram Stain   Final    MODERATE WBC PRESENT,BOTH PMN AND MONONUCLEAR NO ORGANISMS SEEN Performed at [facility], [address] [place], [place] [address]     Report Status 01/02/2019 FINAL  Final  MRSA PCR Screening     Status: Abnormal   Collection Time: 01/04/19  1:39 PM   Specimen: Nasal Mucosa; Nasopharyngeal  Result Value Ref Range Status   MRSA by PCR POSITIVE (A) NEGATIVE Final    Comment:        The GeneXpert MRSA Assay (FDA approved for NASAL specimens only), is one component of a comprehensive MRSA colonization surveillance program. It is not intended to diagnose MRSA infection nor to guide or monitor treatment for MRSA infections. RESULT CALLED TO, READ BACK BY AND VERIFIED WITH: Lorna Dibble [id number] @ [address] Performed at [place] [address], [place], [place] [address]      Radiology Studies: No results found.  Scheduled Meds:  sodium chloride   Intravenous Once   aspirin EC  81 mg Oral Daily   atorvastatin  40 mg Oral q1800   Chlorhexidine Gluconate Cloth  6 each Topical Q0600   docusate sodium  100 mg Oral BID   DULoxetine  60 mg Oral Daily   feeding supplement (ENSURE ENLIVE)  237 mL Oral BID BM   feeding supplement (GLUCERNA SHAKE)  237 mL Oral BID BM   hydrALAZINE  25 mg Oral Q6H   insulin aspart  0-9 Units Subcutaneous Q4H   insulin glargine  20 Units Subcutaneous QAC breakfast   levothyroxine  150 mcg Oral QAC breakfast   mouth rinse  15 mL Mouth Rinse BID   metoprolol succinate  25 mg Oral QPM   metoprolol succinate  50 mg Oral Daily   mupirocin ointment  1 application Nasal BID   pantoprazole  40 mg Oral Daily   polyethylene glycol  17 g Oral BID   potassium chloride  40 mEq Oral Once   senna-docusate  2 tablet Oral BID   Continuous Infusions:  sodium chloride     sodium chloride Stopped (01/07/19 0455)   piperacillin-tazobactam (ZOSYN)  IV Stopped (01/08/19 0920)     LOS: 6 days   Domenic Polite, MD [facility]  01/08/2019, 11:09 AM

## 2019-01-08 NOTE — Progress Notes (Addendum)
Subjective: CC: Patient reports that she is cold. She is not forthcoming with her symptoms. She notes that she continues to be nauseated and have generalized abdominal pain. She reports the pain is constant and does not hurt in one area more than another. No emesis recorded. Nursing note states patient has refused all po liquids and meds. Her last BM was this AM.   Objective: Vital signs in last 24 hours: Temp:  [95.7 F (35.4 C)-97.7 F (36.5 C)] 97.7 F (36.5 C) (10/05 0400) Pulse Rate:  [92-99] 96 (10/04 2000) Resp:  [9-30] 15 (10/05 0600) BP: (130-174)/(84-115) 169/115 (10/05 0600) SpO2:  [93 %-100 %] 93 % (10/05 0400) Last BM Date: (UTA)  Intake/Output from previous day: 10/04 0701 - 10/05 0700 In: 220.7 [I.V.:80; IV Piggyback:140.7] Out: 500 [Urine:500] Intake/Output this shift: No intake/output data recorded.  PE: Gen:  Alert, lying on her left side Pulm:  Normal rate and effort  Abd: Soft, NT/ND, +BS  Lab Results:  Recent Labs    01/07/19 0521 01/08/19 0501  WBC 4.2 5.8  HGB 9.0* 8.3*  HCT 27.7* 25.3*  PLT 26* 14*   BMET Recent Labs    01/07/19 0521 01/08/19 0501  NA 142 143  K 3.7 3.2*  CL 104 106  CO2 24 26  GLUCOSE 122* 147*  BUN 15 21*  CREATININE 0.90 0.96  CALCIUM 8.0* 8.0*   PT/INR No results for input(s): LABPROT, INR in the last 72 hours. CMP     Component Value Date/Time   NA 143 01/08/2019 0501   NA 144 04/04/2017 0940   K 3.2 (L) 01/08/2019 0501   K 3.6 04/04/2017 0940   CL 106 01/08/2019 0501   CO2 26 01/08/2019 0501   CO2 29 04/04/2017 0940   GLUCOSE 147 (H) 01/08/2019 0501   GLUCOSE 111 04/04/2017 0940   BUN 21 (H) 01/08/2019 0501   BUN 4.9 (L) 04/04/2017 0940   CREATININE 0.96 01/08/2019 0501   CREATININE 1.00 12/27/2018 0931   CREATININE 0.8 04/04/2017 0940   CALCIUM 8.0 (L) 01/08/2019 0501   CALCIUM 8.8 04/04/2017 0940   PROT 7.7 01/06/2019 0430   PROT 7.1 04/04/2017 0940   ALBUMIN 2.6 (L) 01/06/2019 0430    ALBUMIN 3.0 (L) 04/04/2017 0940   AST 35 01/06/2019 0430   AST 17 12/27/2018 0931   AST 13 04/04/2017 0940   ALT 14 01/06/2019 0430   ALT 10 12/27/2018 0931   ALT 10 04/04/2017 0940   ALKPHOS 157 (H) 01/06/2019 0430   ALKPHOS 96 04/04/2017 0940   BILITOT 0.9 01/06/2019 0430   BILITOT 0.3 12/27/2018 0931   BILITOT 0.36 04/04/2017 0940   GFRNONAA >60 01/08/2019 0501   GFRNONAA >60 12/27/2018 0931   GFRAA >60 01/08/2019 0501   GFRAA >60 12/27/2018 0931   Lipase     Component Value Date/Time   LIPASE 14 01/01/2019 0541       Studies/Results: No results found.  Anti-infectives: Anti-infectives (From admission, onward)   Start     Dose/Rate Route Frequency Ordered Stop   01/06/19 1215  ciprofloxacin (CIPRO) tablet 500 mg  Status:  Discontinued     500 mg Oral 2 times daily 01/06/19 1200 01/06/19 1201   01/01/19 2200  piperacillin-tazobactam (ZOSYN) IVPB 3.375 g     3.375 g 12.5 mL/hr over 240 Minutes Intravenous Every 8 hours 01/01/19 1450     01/01/19 1500  piperacillin-tazobactam (ZOSYN) IVPB 3.375 g     3.375  g 100 mL/hr over 30 Minutes Intravenous  Once 01/01/19 1450 01/01/19 2045       Assessment/Plan Metastatic lung cancer - last dose of chemo on 9/23 Nonischemic cardiomyopathy HTN Malignant pleural effusion s/p thoracentesis  Leukopenia - WBC 3.8(9/28)>>3.1(9/29)>>1.6(9/30)>>0.8(10/1)<<2.4 (10/3)<<4.2 (10/4)<<5.8 (10/5) Anemia of chronic disease  Abdominal pain, cholelithiasis - HIDA negative for acute cholecystitis on 9/28 but with delyaed visualization of the GB raising concern for chronic cholecystitis - CT AP 10/1 showed mildly distended GB and multiple gallstones but no GB wall thickening or signs of acute cholecystitis - WBC wnl, however patient is currently on chemo (last dose of Avastin 9/23) - IR unable to drain 2/2 contracted GB - Patient likely with chronic cholecystitis, but she is very high risk for surgical intervention. At this time given no  evidence of acute infection. She is NT on exam. Continue symptomatic treatment and course of abx.She can follow up as an outpatient to discuss elective cholecystectomy in the future once she has completed her chemo etc as needed.     FEN: IV fluids, full liquids ID:  Zosyn 9/28>> day 8 DVT: SCDs, currently on hold for thrombocytopenia (plt 14) Follow up:  TBD   LOS: 6 days    Jillyn Ledger , [facility] 01/08/2019, 8:34 AM Pager: [phone]

## 2019-01-09 LAB — CBC
HCT: 23.5 % — ABNORMAL LOW (ref 36.0–46.0)
HCT: 24.8 % — ABNORMAL LOW (ref 36.0–46.0)
Hemoglobin: 7.8 g/dL — ABNORMAL LOW (ref 12.0–15.0)
Hemoglobin: 8.3 g/dL — ABNORMAL LOW (ref 12.0–15.0)
MCH: 33.6 pg (ref 26.0–34.0)
MCH: 33.9 pg (ref 26.0–34.0)
MCHC: 33.2 g/dL (ref 30.0–36.0)
MCHC: 33.5 g/dL (ref 30.0–36.0)
MCV: 101.3 fL — ABNORMAL HIGH (ref 80.0–100.0)
MCV: 101.2 fL — ABNORMAL HIGH (ref 80.0–100.0)
Platelets: 7 10*3/uL — CL (ref 150–400)
Platelets: 45 10*3/uL — ABNORMAL LOW (ref 150–400)
RBC: 2.32 MIL/uL — ABNORMAL LOW (ref 3.87–5.11)
RBC: 2.45 MIL/uL — ABNORMAL LOW (ref 3.87–5.11)
RDW: 19.4 % — ABNORMAL HIGH (ref 11.5–15.5)
RDW: 19.2 % — ABNORMAL HIGH (ref 11.5–15.5)
WBC: 8 10*3/uL (ref 4.0–10.5)
WBC: 10.3 10*3/uL (ref 4.0–10.5)
nRBC: 0 % (ref 0.0–0.2)
nRBC: 0 % (ref 0.0–0.2)

## 2019-01-09 LAB — GLUCOSE, CAPILLARY
Glucose-Capillary: 190 mg/dL — ABNORMAL HIGH (ref 70–99)
Glucose-Capillary: 203 mg/dL — ABNORMAL HIGH (ref 70–99)
Glucose-Capillary: 215 mg/dL — ABNORMAL HIGH (ref 70–99)
Glucose-Capillary: 219 mg/dL — ABNORMAL HIGH (ref 70–99)
Glucose-Capillary: 229 mg/dL — ABNORMAL HIGH (ref 70–99)
Glucose-Capillary: 231 mg/dL — ABNORMAL HIGH (ref 70–99)

## 2019-01-09 LAB — BASIC METABOLIC PANEL
Anion gap: 9 (ref 5–15)
BUN: 23 mg/dL — ABNORMAL HIGH (ref 6–20)
CO2: 27 mmol/L (ref 22–32)
Calcium: 7.8 mg/dL — ABNORMAL LOW (ref 8.9–10.3)
Chloride: 106 mmol/L (ref 98–111)
Creatinine, Ser: 0.96 mg/dL (ref 0.44–1.00)
GFR calc Af Amer: >60 mL/min (ref >60)
GFR calc non Af Amer: >60 mL/min (ref >60)
Glucose, Bld: 214 mg/dL — ABNORMAL HIGH (ref 70–99)
Potassium: 3 mmol/L — ABNORMAL LOW (ref 3.5–5.1)
Sodium: 142 mmol/L (ref 135–145)

## 2019-01-09 MED ORDER — BISACODYL 10 MG RE SUPP
10.0000 mg | Freq: Once | RECTAL | Status: AC
  Administered 2019-01-09: 12:00:00 10 mg via RECTAL

## 2019-01-09 MED ORDER — SODIUM CHLORIDE 0.9% IV SOLUTION
Freq: Once | INTRAVENOUS | Status: AC
  Administered 2019-01-09: 11:00:00 via INTRAVENOUS

## 2019-01-09 MED ORDER — ADULT MULTIVITAMIN W/MINERALS CH
1.0000 | ORAL_TABLET | Freq: Every day | ORAL | Status: DC
  Administered 2019-01-10 – 2019-01-20 (×9): 1 via ORAL
  Filled 2019-01-09 (×11): qty 1

## 2019-01-09 MED ORDER — ENSURE ENLIVE PO LIQD
237.0000 mL | Freq: Two times a day (BID) | ORAL | Status: DC
  Administered 2019-01-10 – 2019-01-20 (×16): 237 mL via ORAL

## 2019-01-09 MED ORDER — POTASSIUM CHLORIDE 10 MEQ/50ML IV SOLN
10.0000 meq | INTRAVENOUS | Status: AC
  Administered 2019-01-09 (×5): 10 meq via INTRAVENOUS
  Filled 2019-01-09 (×5): qty 50

## 2019-01-09 MED ORDER — POTASSIUM CHLORIDE 20 MEQ/15ML (10%) PO SOLN: 40.0000 meq | Freq: Once | ORAL | Status: DC

## 2019-01-09 MED ORDER — BOOST / RESOURCE BREEZE PO LIQD CUSTOM
1.0000 | Freq: Two times a day (BID) | ORAL | Status: DC
  Administered 2019-01-09 – 2019-01-11 (×3): 1 via ORAL

## 2019-01-09 NOTE — Discharge Instructions (Signed)
Gallbladder Eating Plan If you have a gallbladder condition, you may have trouble digesting fats. Eating a low-fat diet can help reduce your symptoms, and may be helpful before and after having surgery to remove your gallbladder (cholecystectomy). Your health care provider may recommend that you work with a diet and nutrition specialist (dietitian) to help you reduce the amount of fat in your diet. What are tips for following this plan? General guidelines  Limit your fat intake to less than 30% of your total daily calories. If you eat around 1,800 calories each day, this is less than 60 grams (g) of fat per day.  Fat is an important part of a healthy diet. Eating a low-fat diet can make it hard to maintain a healthy body weight. Ask your dietitian how much fat, calories, and other nutrients you need each day.  Eat small, frequent meals throughout the day instead of three large meals.  Drink at least 8-10 cups of fluid a day. Drink enough fluid to keep your urine clear or pale yellow.  Limit alcohol intake to no more than 1 drink a day for nonpregnant women and 2 drinks a day for men. One drink equals 12 oz of beer, 5 oz of wine, or 1 oz of hard liquor. Reading food labels  Check Nutrition Facts on food labels for the amount of fat per serving. Choose foods with less than 3 grams of fat per serving. Shopping  Choose nonfat and low-fat healthy foods. Look for the words nonfat, low fat, or fat free.  Avoid buying processed or prepackaged foods. Cooking  Cook using low-fat methods, such as baking, broiling, grilling, or boiling.  Cook with small amounts of healthy fats, such as olive oil, grapeseed oil, canola oil, or sunflower oil. What foods are recommended?   All fresh, frozen, or canned fruits and vegetables.  Whole grains.  Low-fat or non-fat (skim) milk and yogurt.  Lean meat, skinless poultry, fish, eggs, and beans.  Low-fat protein supplement powders or  drinks.  Spices and herbs. What foods are not recommended?  High-fat foods. These include baked goods, fast food, fatty cuts of meat, ice cream, french toast, sweet rolls, pizza, cheese bread, foods covered with butter, creamy sauces, or cheese.  Fried foods. These include french fries, tempura, battered fish, breaded chicken, fried breads, and sweets.  Foods with strong odors.  Foods that cause bloating and gas. Summary  A low-fat diet can be helpful if you have a gallbladder condition, or before and after gallbladder surgery.  Limit your fat intake to less than 30% of your total daily calories. This is about 60 g of fat if you eat 1,800 calories each day.  Eat small, frequent meals throughout the day instead of three large meals. This information is not intended to replace advice given to you by your health care provider. Make sure you discuss any questions you have with your health care provider. Document Released: 03/27/2013 Document Revised: 07/13/2018 Document Reviewed: 04/29/2016 Elsevier Patient Education  2020 [employer].

## 2019-01-09 NOTE — Progress Notes (Signed)
PROGRESS NOTE    Marilyn [name]  EXH:[id number] DOB: 1965-07-17 DOA: 01/01/2019 PCP: [name], FNP  Brief Narrative:  53 y.o. female with Past medical history of metastatic adenocarcinoma, nonischemic cardiomyopathy, HTN, hypothyroidism, malignant pleural effusion S/P thoracentesis x3, hypothyroidism. Patient presented with complaints of right-sided abdominal pain.  She reported that this pain has been ongoing for 3 months but in last 3 days this is worsened. -On chemotherapy, last infusion was 9/23 of Avastin, Alimta, carboplatin -Initial CT and ultrasound noted gallbladder wall thickening, gallstones, HIDA scan noted chronic cholecystitis, situation was complicated by neutropenia from chemotherapy -Followed by general surgery, poor surgical candidate given metastatic cancer -Hospitalization complicated by constipation and severe thrombocytopenia  Assessment & Plan:   1.    Acute on chronic cholecystitis  --Admitted with severe right upper quadrant abdominal pain, worsening from baseline Initial imaging studies with CT and ultrasound noted gallbladder wall thickening, gallstones, HIDA scan showed evidence of chronic cholecystitis   -Treated initially with bowel rest, IV Zosyn, complicated by neutropenia from recent chemotherapy -Repeat CT abdomen pelvis 10/1 without acute findings, gallbladder wall thickening appears to have improved,  -Pain appears to be reportedly a little better, continues to have nausea, unable to eat or drink very much -appreciate general surgery input, she is not a surgical candidate given metastatic cancer, recent Chemotherapy especially Avastin on 9/23 -Completed 6 days of empiric Zosyn, given worsening thrombocytopenia 5 days ago was changed to IV Cipro and Flagyl yesterday 10/5 -Encouraged liquid diet as tolerated, wean IV narcotics -Discussed with IR last week, PERC drain on an option since her gallbladder is contracted -Continue laxative, out of bed,  Dulcolax suppository today  2.  Malignant recurrent right pleural effusion -Patient has metastatic adenocarcinoma of the lung and has recurrent right malignant effusion -Pt is s/p thoracentesis 9/29, yielding 920cc fluid, cytology positive for malignant effusion -Follow-up intermittent x-rays as needed  3.  Metastatic non-small cell lung cancer. -Followed by Dr. Irene Limbo -Currently on chemotherapy with carboplatin, Alimta and Avastin.  Last cycle on 12/27/2018 -Oncology following  4.   Thrombocytopenia  -Due to sepsis, chemotherapy, worsened by recent heparin use  -Heparin discontinued 10/3 -Oncology following -Unfortunately platelet count is trended down further now 7K, HIT antibody panel is negative, SRA pending -Called and discussed with patient's oncologist, Dr.Kale yesterday, he feels that lingering effects from recent chemotherapy could be contributing to her worsening thrombocytopenia as well and beta-lactam's, Zosyn potentially also worsening this, hence stopped IV PIP tazo, started Cipro Flagyl, check immature platelet fraction is within normal range, -Transfuse 1 unit of platelets today, no evidence of overt bleeding at this time -Monitor CBC  5.  Depression. -Continue on cymbalta  6.    Acute on chronic diastolic dysfunction. -Prior history of nonischemic cardiomyopathy showed normal EF -Given Lasix few days ago, monitor, dose periodically  7. Type 2 Diabetes Mellitus, poorly controled without complication -last hemoglobin A1c was 10. -Continue low-dose Lantus, CBGs are stable  DVT prophylaxis: SCDs Code Status: Full Family Communication: No family at bedside, called and updated mother 10/2 Disposition Plan: Keep in stepdown 1 more day, especially given worsening thrombocytopenia  Consultants:   General Surgery  Oncology  IR  Procedures:   Thoracentesis 9/29  Antimicrobials: Anti-infectives (From admission, onward)   Start     Dose/Rate Route Frequency  Ordered Stop   01/08/19 1400  ciprofloxacin (CIPRO) IVPB 400 mg     400 mg 200 mL/hr over 60 Minutes Intravenous Every 12 hours 01/08/19 1311  01/08/19 1400  metroNIDAZOLE (FLAGYL) IVPB 500 mg     500 mg 100 mL/hr over 60 Minutes Intravenous Every 8 hours 01/08/19 1311     01/06/19 1215  ciprofloxacin (CIPRO) tablet 500 mg  Status:  Discontinued     500 mg Oral 2 times daily 01/06/19 1200 01/06/19 1201   01/01/19 2200  piperacillin-tazobactam (ZOSYN) IVPB 3.375 g  Status:  Discontinued     3.375 g 12.5 mL/hr over 240 Minutes Intravenous Every 8 hours 01/01/19 1450 01/08/19 1311   01/01/19 1500  piperacillin-tazobactam (ZOSYN) IVPB 3.375 g     3.375 g 100 mL/hr over 30 Minutes Intravenous  Once 01/01/19 1450 01/01/19 2045      Subjective: -Reports that her abdominal pain is a little better today, rates it at 6 out of 10, willing to try liquids, denies any dyspnea  Objective: Vitals:   01/09/19 0934 01/09/19 1000 01/09/19 1108 01/09/19 1127  BP:  (!) 171/68 (!) 154/65 (!) 168/84  Pulse: (!) 124  (!) 121 (!) 120  Resp:  18 (!) 24   Temp:   98.1 F (36.7 C) 97.6 F (36.4 C)  TempSrc:   Oral Oral  SpO2:   98% 96%  Weight:      Height:        Intake/Output Summary (Last 24 hours) at 01/09/2019 1222 Last data filed at 01/09/2019 0950 Gross per 24 hour  Intake 844.98 ml  Output 1000 ml  Net -155.02 ml   Filed Weights   01/01/19 0416 01/04/19 1330  Weight: 119.7 kg 119.9 kg    Examination:  Gen: Really obese chronically ill-appearing African-American female, sitting up in bed, appears more comfortable than yesterday, AAO x2 HEENT: PERRLA, Neck supple, no JVD Lungs: Decreased breath sounds both bases CVS: S1-S2, regular rhythm, tachycardic Abd: Soft, obese, mildly distended, right-sided tenderness has improved, diminished bowel sounds Extremities: No edema Skin: no new rashes Psychiatry: Flat affect  Data Reviewed: I have personally reviewed following labs and  imaging studies  CBC: Recent Labs  Lab 01/03/19 0437 01/04/19 0314 01/05/19 0631 01/06/19 0430 01/07/19 0521 01/08/19 0501 01/09/19 0348  WBC 1.6* 0.8* 1.4* 2.4* 4.2 5.8 8.0  NEUTROABS 1.1* 0.2* 0.7*  --   --  5.2  --   HGB 8.8* 8.6* 8.6* 8.4* 9.0* 8.3* 7.8*  HCT 26.5* 25.9* 26.3* 26.1* 27.7* 25.3* 23.5*  MCV 101.9* 102.4* 102.7* 103.2* 103.0* 101.6* 101.3*  PLT 154 130* 78* 45* 26* 14* 7*   Basic Metabolic Panel: Recent Labs  Lab 01/05/19 0631 01/06/19 0430 01/07/19 0521 01/08/19 0501 01/09/19 0348  NA 139 141 142 143 142  K 3.9 3.5 3.7 3.2* 3.0*  CL 103 105 104 106 106  CO2 26 26 24 26 27   GLUCOSE 114* 120* 122* 147* 214*  BUN 10 14 15  21* 23*  CREATININE 0.88 0.95 0.90 0.96 0.96  CALCIUM 7.7* 7.9* 8.0* 8.0* 7.8*   GFR: Estimated Creatinine Clearance: 96 mL/min (by C-G formula based on SCr of 0.96 mg/dL). Liver Function Tests: Recent Labs  Lab 01/03/19 0437 01/04/19 0314 01/05/19 0631 01/06/19 0430  AST 28 26 31  35  ALT 15 14 15 14   ALKPHOS 102 113 161* 157*  BILITOT 0.7 0.7 0.8 0.9  PROT 7.5 7.3 7.3 7.7  ALBUMIN 2.8* 2.7* 2.7* 2.6*   No results for input(s): LIPASE, AMYLASE in the last 168 hours. No results for input(s): AMMONIA in the last 168 hours. Coagulation Profile: No results for input(s): INR, PROTIME in the  last 168 hours. Cardiac Enzymes: No results for input(s): CKTOTAL, CKMB, CKMBINDEX, TROPONINI in the last 168 hours. BNP (last 3 results) No results for input(s): PROBNP in the last 8760 hours. HbA1C: No results for input(s): HGBA1C in the last 72 hours. CBG: Recent Labs  Lab 01/08/19 1945 01/08/19 2346 01/09/19 0336 01/09/19 0748 01/09/19 1132  GLUCAP 164* 179* 190* 215* 231*   Lipid Profile: No results for input(s): CHOL, HDL, LDLCALC, TRIG, CHOLHDL, LDLDIRECT in the last 72 hours. Thyroid Function Tests: No results for input(s): TSH, T4TOTAL, FREET4, T3FREE, THYROIDAB in the last 72 hours. Anemia Panel: No results for  input(s): VITAMINB12, FOLATE, FERRITIN, TIBC, IRON, RETICCTPCT in the last 72 hours. Sepsis Labs: No results for input(s): PROCALCITON, LATICACIDVEN in the last 168 hours.  Recent Results (from the past 240 hour(s))  SARS CORONAVIRUS 2 (TAT 6-24 HRS) Nasopharyngeal Nasopharyngeal Swab     Status: None   Collection Time: 01/01/19  6:43 AM   Specimen: Nasopharyngeal Swab  Result Value Ref Range Status   SARS Coronavirus 2 NEGATIVE NEGATIVE Final    Comment: (NOTE) SARS-CoV-2 target nucleic acids are NOT DETECTED. The SARS-CoV-2 RNA is generally detectable in upper and lower respiratory specimens during the acute phase of infection. Negative results do not preclude SARS-CoV-2 infection, do not rule out co-infections with other pathogens, and should not be used as the sole basis for treatment or other patient management decisions. Negative results must be combined with clinical observations, patient history, and epidemiological information. The expected result is Negative. Fact Sheet for Patients: SugarRoll.be Fact Sheet for Healthcare Providers: https://www.woods-mathews.com/ This test is not yet approved or cleared by the [place] FDA and  has been authorized for detection and/or diagnosis of SARS-CoV-2 by FDA under an Emergency Use Authorization (EUA). This EUA will remain  in effect (meaning this test can be used) for the duration of the COVID-19 declaration under Section 56 4(b)(1) of the Act, 21 U.S.C. section 360bbb-3(b)(1), unless the authorization is terminated or revoked sooner. Performed at [facility], [place] [address], [place], [place] [address]   Urine culture     Status: None   Collection Time: 01/01/19  9:31 AM   Specimen: Urine, Catheterized  Result Value Ref Range Status   Specimen Description   Final    URINE, CATHETERIZED Performed at [facility] [address], [place], [place] [address]     Special Requests   Final    NONE Performed at [facility], [place] [address], [place], [place] [address]    Culture   Final    NO GROWTH Performed at [facility], [place] [address] [place], [place] [address]    Report Status 01/03/2019 FINAL  Final  Culture, blood (x 2)     Status: None   Collection Time: 01/01/19  9:03 PM   Specimen: BLOOD  Result Value Ref Range Status   Specimen Description   Final    BLOOD LEFT ANTECUBITAL Performed at [facility] [address] [place], [place] [address]    Special Requests   Final    BOTTLES DRAWN AEROBIC AND ANAEROBIC Blood Culture adequate volume Performed at [facility] [address] [place], [place] [address]    Culture   Final    NO GROWTH 5 DAYS Performed at [facility], [place] [address] [place], [place] [address]    Report Status 01/07/2019 FINAL  Final  Culture, blood (x 2)  Status: None   Collection Time: 01/01/19  9:03 PM   Specimen: BLOOD  Result Value Ref Range Status   Specimen Description   Final    BLOOD RIGHT ANTECUBITAL Performed at [facility] [address] [place], [place] [address]    Special Requests   Final    BOTTLES DRAWN AEROBIC AND ANAEROBIC Blood Culture adequate volume Performed at [facility] [address] [place], [place] [address]    Culture   Final    NO GROWTH 5 DAYS Performed at [facility], [place] [address], [place], [place] [address]    Report Status 01/07/2019 FINAL  Final  Culture, body fluid-bottle     Status: None   Collection Time: 01/02/19 12:40 PM   Specimen: Pleura  Result Value Ref Range Status   Specimen Description PLEURAL LEFT  Final   Special Requests NONE  Final   Culture   Final    NO GROWTH 5 DAYS Performed at [facility], [address] [place], [place] [address]    Report Status 01/07/2019 FINAL  Final  Gram stain     Status: None    Collection Time: 01/02/19 12:40 PM   Specimen: Pleura  Result Value Ref Range Status   Specimen Description PLEURAL LEFT  Final   Special Requests NONE  Final   Gram Stain   Final    MODERATE WBC PRESENT,BOTH PMN AND MONONUCLEAR NO ORGANISMS SEEN Performed at [facility], [address] [place], [place] [address]    Report Status 01/02/2019 FINAL  Final  MRSA PCR Screening     Status: Abnormal   Collection Time: 01/04/19  1:39 PM   Specimen: Nasal Mucosa; Nasopharyngeal  Result Value Ref Range Status   MRSA by PCR POSITIVE (A) NEGATIVE Final    Comment:        The GeneXpert MRSA Assay (FDA approved for NASAL specimens only), is one component of a comprehensive MRSA colonization surveillance program. It is not intended to diagnose MRSA infection nor to guide or monitor treatment for MRSA infections. RESULT CALLED TO, READ BACK BY AND VERIFIED WITH: Lorna Dibble [id number] @ [address] [place] Performed at [facility] [address], [place], [place] [address]      Radiology Studies: No results found.  Scheduled Meds: . sodium chloride   Intravenous Once  . atorvastatin  40 mg Oral q1800  . Chlorhexidine Gluconate Cloth  6 each Topical Q0600  . docusate sodium  100 mg Oral BID  . DULoxetine  60 mg Oral Daily  . feeding supplement  1 Container Oral BID BM  . feeding supplement (ENSURE ENLIVE)  237 mL Oral BID BM  . folic acid  1 mg Oral Daily  . insulin aspart  0-9 Units Subcutaneous Q4H  . insulin glargine  20 Units Subcutaneous QAC breakfast  . levothyroxine  150 mcg Oral QAC breakfast  . mouth rinse  15 mL Mouth Rinse BID  . metoprolol succinate  25 mg Oral QPM  . metoprolol succinate  50 mg Oral Daily  . multivitamin with minerals  1 tablet Oral Daily  . mupirocin ointment  1 application Nasal BID  . pantoprazole  40 mg Oral Daily  . polyethylene glycol  17 g Oral BID  . senna-docusate  2 tablet Oral BID  . vitamin B-12  1,000 mcg  Oral Daily   Continuous Infusions: . sodium chloride    . sodium  chloride Stopped (01/07/19 0455)  . ciprofloxacin Stopped (01/09/19 0316)  . metronidazole Stopped (01/09/19 7124)     LOS: 7 days   Domenic Polite, MD [facility]  01/09/2019, 12:22 PM

## 2019-01-09 NOTE — Progress Notes (Signed)
Nutrition Follow-up  DOCUMENTATION CODES:   Obesity unspecified  INTERVENTION:  - continue Ensure Enlive BID, each supplement provides 350 kcal and 20 grams of protein. - will order Boost Breeze BID, each supplement provides 250 kcal and 9 grams of protein. - will d/c Glucerna Shake. - will order daily multivitamin with minerals.  - continue to encourage PO intakes as tolerated.    NUTRITION DIAGNOSIS:   Increased nutrient needs related to chronic illness, cancer and cancer related treatments as evidenced by estimated needs. -ongoing  GOAL:   Patient will meet greater than or equal to 90% of their needs -unmet  MONITOR:   PO intake, Supplement acceptance, Labs, Weight trends  ASSESSMENT:   53 y.o. female with medical history of metastatic adenocarcinoma, non-ischemic cardiomyopathy, HTN, hypothyroidism, malignant pleural effusion s/p thoracentesis x3, hypothyroidism. Patient presented to the ED on 9/28 with complaints of abdominal pain. She reported that pain has been ongoing for 3 months but was worse for the past 3 days. Patient also reported nausea and constipation. Patient's last chemo was on 9/23. She has been having ongoing SOB and chronic cough. Abdominal ultrasound showed cholecystitis. CT scan showed gallbladder wall thickening, no other acute abnormalities. She was admitted for recurrent R pleural effusion for thoracentesis as well as pain control of abdomen.  Diet advanced from NPO to [place]rens on 9/30 at 1400. Per flow sheet, she consumed 100% of lunch on 10/2 and 10% of dinner on 10/5; no other intakes documented since advancement. Breakfast tray untouched on bedside table. Nurse, who was at bedside, reported that patient did drink grape juice. Patient was taking PO medications at the time of RD visit. Patient reports ongoing abdominal pain, N/V. Unsure of last episode of emesis. She denies anything making abdominal discomfort better or worse. She has been accepting Ensure  supplements ~50% of the time offered.   Per notes: - HIDA on 9/28 negative for acute cholecystitis but concerning for chronic cholecystitis--Surgery team states high risk for surgical intervention d/t this - CT abdomen/pelvis 10/1 showed mild distention to gallbladder and multiple gallstones, no gallbladder wall thickening - malignant recurrent R pleural effusion 2/2 metastatic andenocarcinoma of the lung (NSCLC)--thoracentesis on 9/29 with 920 ml removed - thrombocytopenia 2/2 sepsis, chemo, and worsened by recent heparin use--heparin d/c on 10/3 - depression - acute on chronic diastolic dysfunction - type 2 DM with last HgbA1c: 10%    Labs reviewed; CBGs: 190 and 215 mg/dl, K: 3 mmol/l, BUN: 23 mg/dl, Ca: 7.8 mg/dl.  Medications reviewed; 10 mg rectal dulcolax x1 dose 10/5, 100 mg colace BID, 1 mg folvite/day, sliding scale novolog, 20 units lantus/day, 150 mcg oral synthroid/day, 1 packet miralax BID, 10 mEq IV KCl x5 runs 10/6, 2 tablets senokot BID, 1000 mcg oral cyanocobalamin/day.     Diet Order:   Diet Order            Diet full liquid Room service appropriate? Yes; Fluid consistency: Thin  Diet effective now              EDUCATION NEEDS:   No education needs have been identified at this time  Skin:  Skin Assessment: Reviewed RN Assessment  Last BM:  9/27  Height:   Ht Readings from Last 1 Encounters:  01/04/19 5\' 9"  (1.753 m)    Weight:   Wt Readings from Last 1 Encounters:  01/04/19 119.9 kg    Ideal Body Weight:  65.9 kg  BMI:  Body mass index is 39.03 kg/m.  Estimated  Nutritional Needs:   Kcal:  2250-2450 kcal  Protein:  115-125 grams  Fluid:  >/= 2.2 L/day     Jarome Matin, MS, RD, LDN, [facility] Inpatient Clinical Dietitian Pager # [phone] After hours/weekend pager # [phone]

## 2019-01-09 NOTE — Final Consult Note (Signed)
Consultant Final Sign-Off Note    Assessment/Final recommendations  Marilyn [name] is a 53 y.o. female followed by me for Abdominal pain, cholelithiasis  Patient had extensive workup for abdominal pain - HIDA negative for acute cholecystitis on 9/28 but with delyaed visualization of the GB raising concern for chronic cholecystitis - CT AP 10/1 showed mildly distended GB and multiple gallstones but no GB wall thickening or signs of acute cholecystitis - WBC wnl, however patient is currently on chemo (last dose of Avastin 9/23) - IR unable to drain 2/2 contracted GB - Patient likely with chronic cholecystitis. She is very high risk for surgical intervention. At this time given no evidence of acute infection will plant to continue symptomatic treatment and course of abx.She can follow up as an outpatient to discuss elective cholecystectomy in the future once she has completed her chemo etc as needed.    Wound care (if applicable): N/A   Diet at discharge: low fat diet (handout included)   Activity at discharge: per primary team   Follow-up appointment:  [facility], Dr. Redmond Pulling   Pending results:  Unresulted Labs (From admission, onward)    Start     Ordered   01/09/19 0718  Prepare Pheresed Platelets  (Adult Blood Administration - Platelets (Pheresed))  Once,   R    Question Answer Comment  Number of Apheresis Units 1 unit (6-10 packs)   Transfusion Indications PLT Count </=10,000/mm      01/09/19 0717   01/08/19 0500  CBC  Tomorrow morning,   R    Question:  Specimen collection method  Answer:  Unit=Unit collect   01/07/19 1102   01/07/19 0735  Serotonin release assay (SRA)  Add-on,   AD    Question:  Specimen collection method  Answer:  Unit=Unit collect   01/07/19 0734           Medication recommendations:    Other recommendations:    Thank you for allowing [place] to participate in the care of your patient!  Please consult [place] again if you have further needs for your  patient.  Barth Kirks [facility] 01/09/2019 8:09 AM    Subjective   Patient reports that she had an "excellent" night. She reports her abdominal pain is better when compared to yesterday. She was able to drink some of her ensure and other liquids without any emesis. She is having bowel function.   Objective  Vital signs in last 24 hours: Temp:  [96.1 F (35.6 C)-98.2 F (36.8 C)] 98.2 F (36.8 C) (10/06 0700) Pulse Rate:  [103-106] 103 (10/05 1300) Resp:  [0-25] 13 (10/06 0600) BP: (140-199)/(60-114) 152/60 (10/06 0600) SpO2:  [90 %-93 %] 90 % (10/05 1300)  Gen:  Alert, lying on her left side Pulm:  Normal rate and effort  Abd: Soft, ND, some mild upper abdominal tenderness without point tenderness or peritonitis, +BS  Pertinent labs and Studies: Recent Labs    01/07/19 0521 01/08/19 0501 01/09/19 0348  WBC 4.2 5.8 8.0  HGB 9.0* 8.3* 7.8*  HCT 27.7* 25.3* 23.5*   BMET Recent Labs    01/08/19 0501 01/09/19 0348  NA 143 142  K 3.2* 3.0*  CL 106 106  CO2 26 27  GLUCOSE 147* 214*  BUN 21* 23*  CREATININE 0.96 0.96  CALCIUM 8.0* 7.8*   No results for input(s): LABURIN in the last 72 hours. Results for orders placed or performed during the hospital encounter of 01/01/19  SARS CORONAVIRUS 2 (TAT 6-24 HRS)  Nasopharyngeal Nasopharyngeal Swab     Status: None   Collection Time: 01/01/19  6:43 AM   Specimen: Nasopharyngeal Swab  Result Value Ref Range Status   SARS Coronavirus 2 NEGATIVE NEGATIVE Final    Comment: (NOTE) SARS-CoV-2 target nucleic acids are NOT DETECTED. The SARS-CoV-2 RNA is generally detectable in upper and lower respiratory specimens during the acute phase of infection. Negative results do not preclude SARS-CoV-2 infection, do not rule out co-infections with other pathogens, and should not be used as the sole basis for treatment or other patient management decisions. Negative results must be combined with clinical observations, patient history, and  epidemiological information. The expected result is Negative. Fact Sheet for Patients: SugarRoll.be Fact Sheet for Healthcare Providers: https://www.woods-mathews.com/ This test is not yet approved or cleared by the [place] FDA and  has been authorized for detection and/or diagnosis of SARS-CoV-2 by FDA under an Emergency Use Authorization (EUA). This EUA will remain  in effect (meaning this test can be used) for the duration of the COVID-19 declaration under Section 56 4(b)(1) of the Act, 21 U.S.C. section 360bbb-3(b)(1), unless the authorization is terminated or revoked sooner. Performed at [facility], [place] [address] [place], [place] [address]   Urine culture     Status: None   Collection Time: 01/01/19  9:31 AM   Specimen: Urine, Catheterized  Result Value Ref Range Status   Specimen Description   Final    URINE, CATHETERIZED Performed at [facility] [address] [place], [place] [address]    Special Requests   Final    NONE Performed at [facility], [place] [address] [place], [place] [address]    Culture   Final    NO GROWTH Performed at [facility], [place] [address], [place], [place] [address]    Report Status 01/03/2019 FINAL  Final  Culture, blood (x 2)     Status: None   Collection Time: 01/01/19  9:03 PM   Specimen: BLOOD  Result Value Ref Range Status   Specimen Description   Final    BLOOD LEFT ANTECUBITAL Performed at [facility] [address], [place], [place] [address]    Special Requests   Final    BOTTLES DRAWN AEROBIC AND ANAEROBIC Blood Culture adequate volume Performed at [facility] [address] [place], [place] [address]    Culture   Final    NO GROWTH 5 DAYS Performed at [facility], [place] [address] [place], [place] [address]    Report Status 01/07/2019 FINAL  Final  Culture, blood (x 2)      Status: None   Collection Time: 01/01/19  9:03 PM   Specimen: BLOOD  Result Value Ref Range Status   Specimen Description   Final    BLOOD RIGHT ANTECUBITAL Performed at [facility] [address] [place], [place] [address]    Special Requests   Final    BOTTLES DRAWN AEROBIC AND ANAEROBIC Blood Culture adequate volume Performed at [facility] [address], [place], [place] [address]    Culture   Final    NO GROWTH 5 DAYS Performed at [facility], [place] [address], [place], [place] [address]    Report Status 01/07/2019 FINAL  Final  Culture, body fluid-bottle     Status: None   Collection Time: 01/02/19 12:40 PM   Specimen: Pleura  Result Value Ref Range Status  Specimen Description PLEURAL LEFT  Final   Special Requests NONE  Final   Culture   Final    NO GROWTH 5 DAYS Performed at [facility] [address] [place], [place] [address]    Report Status 01/07/2019 FINAL  Final  Gram stain     Status: None   Collection Time: 01/02/19 12:40 PM   Specimen: Pleura  Result Value Ref Range Status   Specimen Description PLEURAL LEFT  Final   Special Requests NONE  Final   Gram Stain   Final    MODERATE WBC PRESENT,BOTH PMN AND MONONUCLEAR NO ORGANISMS SEEN Performed at [facility], [address] [place], [place] [address]    Report Status 01/02/2019 FINAL  Final  MRSA PCR Screening     Status: Abnormal   Collection Time: 01/04/19  1:39 PM   Specimen: Nasal Mucosa; Nasopharyngeal  Result Value Ref Range Status   MRSA by PCR POSITIVE (A) NEGATIVE Final    Comment:        The GeneXpert MRSA Assay (FDA approved for NASAL specimens only), is one component of a comprehensive MRSA colonization surveillance program. It is not intended to diagnose MRSA infection nor to guide or monitor treatment for MRSA infections. RESULT CALLED TO, READ BACK BY AND VERIFIED WITH: Lorna Dibble [id number] @ [address] Performed at [facility] [address] [place], [place] [address]     Imaging: No results found.

## 2019-01-10 ENCOUNTER — Inpatient Hospital Stay (HOSPITAL_COMMUNITY): Payer: Medicare Other

## 2019-01-10 DIAGNOSIS — D539 Nutritional anemia, unspecified: Secondary | ICD-10-CM

## 2019-01-10 DIAGNOSIS — R000 Tachycardia, unspecified: Secondary | ICD-10-CM

## 2019-01-10 DIAGNOSIS — R310 Gross hematuria: Secondary | ICD-10-CM

## 2019-01-10 DIAGNOSIS — E785 Hyperlipidemia, unspecified: Secondary | ICD-10-CM

## 2019-01-10 DIAGNOSIS — I428 Other cardiomyopathies: Secondary | ICD-10-CM

## 2019-01-10 DIAGNOSIS — E876 Hypokalemia: Secondary | ICD-10-CM

## 2019-01-10 DIAGNOSIS — E119 Type 2 diabetes mellitus without complications: Secondary | ICD-10-CM

## 2019-01-10 DIAGNOSIS — E039 Hypothyroidism, unspecified: Secondary | ICD-10-CM

## 2019-01-10 DIAGNOSIS — I5032 Chronic diastolic (congestive) heart failure: Secondary | ICD-10-CM

## 2019-01-10 LAB — URINALYSIS, ROUTINE W REFLEX MICROSCOPIC
Bilirubin Urine: NEGATIVE
Glucose, UA: 50 mg/dL — AB
Ketones, ur: NEGATIVE mg/dL
Nitrite: NEGATIVE
Protein, ur: 30 mg/dL — AB
Specific Gravity, Urine: 1.013 (ref 1.005–1.030)
pH: 6 (ref 5.0–8.0)

## 2019-01-10 LAB — CBC
HCT: 21.6 % — ABNORMAL LOW (ref 36.0–46.0)
Hemoglobin: 7.2 g/dL — ABNORMAL LOW (ref 12.0–15.0)
MCH: 34 pg (ref 26.0–34.0)
MCHC: 33.3 g/dL (ref 30.0–36.0)
MCV: 101.9 fL — ABNORMAL HIGH (ref 80.0–100.0)
Platelets: 30 10*3/uL — ABNORMAL LOW (ref 150–400)
RBC: 2.12 MIL/uL — ABNORMAL LOW (ref 3.87–5.11)
RDW: 19.3 % — ABNORMAL HIGH (ref 11.5–15.5)
WBC: 8 10*3/uL (ref 4.0–10.5)
nRBC: 0 % (ref 0.0–0.2)

## 2019-01-10 LAB — BASIC METABOLIC PANEL
Anion gap: 10 (ref 5–15)
BUN: 15 mg/dL (ref 6–20)
CO2: 27 mmol/L (ref 22–32)
Calcium: 7.8 mg/dL — ABNORMAL LOW (ref 8.9–10.3)
Chloride: 104 mmol/L (ref 98–111)
Creatinine, Ser: 0.84 mg/dL (ref 0.44–1.00)
GFR calc Af Amer: >60 mL/min (ref >60)
GFR calc non Af Amer: >60 mL/min (ref >60)
Glucose, Bld: 181 mg/dL — ABNORMAL HIGH (ref 70–99)
Potassium: 2.9 mmol/L — ABNORMAL LOW (ref 3.5–5.1)
Sodium: 141 mmol/L (ref 135–145)

## 2019-01-10 LAB — PREPARE PLATELET PHERESIS: Unit division: 0

## 2019-01-10 LAB — SEROTONIN RELEASE ASSAY (SRA)
SRA .2 IU/mL UFH Ser-aCnc: 7 % (ref 0–20)
SRA 100IU/mL UFH Ser-aCnc: 3 % (ref 0–20)

## 2019-01-10 LAB — PREPARE RBC (CROSSMATCH)

## 2019-01-10 LAB — GLUCOSE, CAPILLARY
Glucose-Capillary: 155 mg/dL — ABNORMAL HIGH (ref 70–99)
Glucose-Capillary: 176 mg/dL — ABNORMAL HIGH (ref 70–99)
Glucose-Capillary: 208 mg/dL — ABNORMAL HIGH (ref 70–99)
Glucose-Capillary: 269 mg/dL — ABNORMAL HIGH (ref 70–99)
Glucose-Capillary: 259 mg/dL — ABNORMAL HIGH (ref 70–99)

## 2019-01-10 LAB — PHOSPHORUS: Phosphorus: 1.1 mg/dL — ABNORMAL LOW (ref 2.5–4.6)

## 2019-01-10 LAB — HEMOGLOBIN AND HEMATOCRIT, BLOOD
HCT: 23.2 % — ABNORMAL LOW (ref 36.0–46.0)
Hemoglobin: 7.4 g/dL — ABNORMAL LOW (ref 12.0–15.0)

## 2019-01-10 LAB — BPAM PLATELET PHERESIS
Blood Product Expiration Date: 202010082359
ISSUE DATE / TIME: 202010061103
Unit Type and Rh: 600

## 2019-01-10 LAB — MAGNESIUM: Magnesium: 1.7 mg/dL (ref 1.7–2.4)

## 2019-01-10 MED ORDER — POTASSIUM CHLORIDE 10 MEQ/50ML IV SOLN
10.0000 meq | INTRAVENOUS | Status: AC
  Administered 2019-01-10 (×3): 10 meq via INTRAVENOUS
  Filled 2019-01-10 (×4): qty 50

## 2019-01-10 MED ORDER — CHLORHEXIDINE GLUCONATE CLOTH 2 % EX PADS
6.0000 | MEDICATED_PAD | Freq: Every day | CUTANEOUS | Status: DC
  Administered 2019-01-10 – 2019-01-20 (×8): 6 via TOPICAL

## 2019-01-10 MED ORDER — POTASSIUM CHLORIDE 10 MEQ/50ML IV SOLN
10.0000 meq | INTRAVENOUS | Status: DC
  Administered 2019-01-10 (×2): 10 meq via INTRAVENOUS
  Filled 2019-01-10: qty 50

## 2019-01-10 MED ORDER — ALTEPLASE 2 MG IJ SOLR
2.0000 mg | Freq: Once | INTRAMUSCULAR | Status: AC
  Administered 2019-01-10: 2 mg
  Filled 2019-01-10 (×2): qty 2

## 2019-01-10 MED ORDER — FUROSEMIDE 10 MG/ML IJ SOLN
40.0000 mg | Freq: Once | INTRAMUSCULAR | Status: AC
  Administered 2019-01-11: 40 mg via INTRAVENOUS
  Filled 2019-01-10: qty 4

## 2019-01-10 MED ORDER — SODIUM CHLORIDE 0.9% IV SOLUTION: Freq: Once | INTRAVENOUS | Status: DC

## 2019-01-10 MED ORDER — POTASSIUM PHOSPHATES 15 MMOLE/5ML IV SOLN
30.0000 mmol | Freq: Once | INTRAVENOUS | Status: AC
  Administered 2019-01-10: 30 mmol via INTRAVENOUS
  Filled 2019-01-10: qty 10

## 2019-01-10 MED ORDER — POTASSIUM CHLORIDE CRYS ER 20 MEQ PO TBCR
40.0000 meq | EXTENDED_RELEASE_TABLET | Freq: Once | ORAL | Status: AC
  Administered 2019-01-10: 09:00:00 40 meq via ORAL
  Filled 2019-01-10: qty 2

## 2019-01-10 MED ORDER — MAGNESIUM SULFATE IN D5W 1-5 GM/100ML-% IV SOLN
1.0000 g | Freq: Once | INTRAVENOUS | Status: AC
  Administered 2019-01-10: 10:00:00 1 g via INTRAVENOUS
  Filled 2019-01-10: qty 100

## 2019-01-10 NOTE — Progress Notes (Signed)
OT Cancellation Note  Patient Details Name: Marilyn [name] MRN: [id number] DOB: [birth year]   Cancelled Treatment:    Reason Eval/Treat Not Completed: Medical issues which prohibited therapy. Per RN, pt is lightheadedness and weak.  May get blood today.  Will check another day.  Jayme Mednick 01/10/2019, 1:51 PM  Lesle Chris, OTR/L Acute Rehabilitation Services [phone] pager [phone] office 01/10/2019

## 2019-01-10 NOTE — Progress Notes (Signed)
PT Cancellation Note  Patient Details Name: GWENDOLIN BRIEL MRN: [id number] DOB: 25-May-1965   Cancelled Treatment:    Reason Eval/Treat Not Completed: Medical issues which prohibited therapy--RN recommended PT check back another day.   Weston Anna, PT Acute Rehabilitation Services Pager: [phone] Office: [phone]

## 2019-01-10 NOTE — Progress Notes (Signed)
PROGRESS NOTE    Marilyn [name]  ZOX:[id number] DOB: 06/25/1965 DOA: 01/01/2019 PCP: [name], FNP  Brief Narrative:  The patient is a 53 year old morbidly obese African-American female with a past medical history significant for metastatic adenocarcinoma, nonischemic cardiomyopathy, hypertension, hypothyroidism, history of malignant pleural effusion status post thoracentesis x3, hypothyroidism as well as other comorbidities who presented with a chief complaint of right-sided abdominal pain.  She reported the pain was going on for the last 3 months for last 3 days and acutely worsened.  She is currently on chemotherapy and her last infusion was on 12/27/2018 with Avastin, Alimta, carboplatin.  Initial CT and ultrasound noted gallbladder wall thickening, gallstones and HIDA scan was done which noted chronic cholecystitis.  Situation was complicated by neutropenia from chemotherapy.  General surgery evaluated and recommended she is a poor surgical candidate given her metastatic cancer recommended medical therapy as well as continue antibiotics for now and follow-up in outpatient setting.  They recommend advancing diet as tolerated and IR was unable to drain her gallbladder due to being contracted general surgery recommended symptomatic treatment course of antibiotics and follow-up as an outpatient to discuss elective cholecystectomy when she is completed chemotherapy as needed.  Hospitalization has been complicated by constipation and severe thrombocytopenia and now some hematuria which is worsening.  **Interim History on 01/10/2019 General surgery has signed off and PT OT not able to be done as patient was feeling a little bit dizzy and nauseous.  Hemoglobin hematocrit is trending down and will obtain a urinalysis and urine culture given her hematuria now.  Patient recently received subcu heparin which is now been stopped.  Will need to transfuse PRBCs given worsening hemoglobin.  Assessment &  Plan:   Active Problems:   Diabetes mellitus without complication (HCC)   Adenocarcinoma of left lung, stage 3 (HCC)   Port catheter in place   Sinus tachycardia   Hypothyroidism   Non-ischemic cardiomyopathy (HCC)   Essential hypertension   HLD (hyperlipidemia)   Depression   Chronic diastolic CHF (congestive heart failure) (HCC)   Recurrent right pleural effusion   Metastatic adenocarcinoma to lung, right (HCC)   Cholecystitis, acute   Biliary colic   Chemotherapy-induced neutropenia (HCC)  Acute on chronic cholecystitis  -Admitted with severe right upper quadrant abdominal pain, worsening from baseline Initial imaging studies with CT and ultrasound noted gallbladder wall thickening, gallstones, HIDA scan showed evidence of chronic cholecystitis   -Treated initially with bowel rest, IV Zosyn, complicated by neutropenia from recent chemotherapy -Repeat CT abdomen pelvis 10/1 without acute findings, gallbladder wall thickening appears to have improved,  -Pain appears to be reportedly a little better, continues to have some nausea today unable to eat or drink very much -Appreciate general surgery input, she is not a surgical candidate given metastatic cancer, recent Chemotherapy especially Avastin on 9/23 -Completed 6 days of empiric Zosyn, given worsening thrombocytopenia 5 days ago was changed to IV Cipro and Flagyl yesterday 10/5 -Encouraged liquid diet as tolerated, wean IV narcotics; ADAT -Discussed with IR last week, PERC drain not an option since her gallbladder is contracted -Continue laxative, out of bed, Dulcolax suppository  -Ambulate and OOB  Malignant recurrent right pleural effusion -Patient has metastatic adenocarcinoma of the lung and has recurrent right malignant effusion -Pt is s/p thoracentesis 9/29, yielding 920cc fluid, cytology positive for malignant effusion -Follow-up intermittent x-rays as needed and will repeat in AM   Metastatic non-small cell lung  cancer. -Followed by Dr. Irene Limbo -Currently on  chemotherapy with carboplatin, Alimta and Avastin. Last cycle on 12/27/2018 -Oncology following and appreciate further evaluation and Recc's  Thrombocytopenia  -Due to sepsis, chemotherapy, worsened by recent heparin use; Now platelet Count is 30,00 -Heparin discontinued 10/3 -Oncology following -Unfortunately platelet count is trended down further now 7K, HIT antibody panel is negative, SRA pending -My Partener Dr. Josephina Shih and discussed with patient's oncologist, Dr.Kale day before yesterday, he feels that lingering effects from recent chemotherapy could be contributing to her worsening thrombocytopenia as well and beta-lactam's, Zosyn potentially also worsening this, hence stopped IV PIP tazo, started Cipro Flagyl, check immature platelet fraction is within normal range, -Transfuse 1 unit of platelets yesterday, no evidence of overt bleeding at this time -Monitor CBC  Macrocyctic Anemia/ABLA from hematuria -Worsening Hgb/Hct and trended down to 7.2/21.6 -Type and Screen and Transfuse 2 units of pRBC's -in the setting of Hematuria and recent Chemo as well as sq Heparin use -Continue to Monitor for S/Sx of Bleeding; Having hematuria per nurse and will obtain a UA and Urine Cx -Repeat CBC in AM   Depression. -Continue on Cymbalta  Acute on chronic diastolic dysfunction. -Prior history of nonischemic cardiomyopathy showed normal EF -Given Lasix few days ago, monitor, dose periodically -Will give 2 units and IV 40 mg Lasix in between -Strict I's/O's and Daily Weights -Continue to Monitor for S/Sx of Volume Overload -Repeat CXR in AM   Hypokalemia -Patient's K+ was 2.9 -Replete with IV KCl, po KCl, and IV K Phos -Continue to Monitor and Replete as necessary -Repeat CMP in AM  Hypophosphatemia -Patient's Phos Level was 1.1 -Replete with IV K Phos 30 mmol -Continue to Monitor and Replete as Necessary -Repeat Phos Level in AM    Type 2 Diabetes Mellitus,poorlycontroled withoutcomplication -last hemoglobin A1c was10. -Continue low-dose Lantus, CBGs are stable  Obesity -Estimated body mass index is 39.03 kg/m as calculated from the following:   Height as of this encounter: 5\' 9"  (1.753 m).   Weight as of this encounter: 119.9 kg. -Weight Loss and Dietary Counseling given  -Nutritionist consulted and recommending Ensure Enlive BID, Boost Breeze BID, MVI with Minerals and encouraging po Intake   DVT prophylaxis: SCDs Code Status: FULL CODE  Family Communication: No family present at bedside  Disposition Plan: Pending Clinical Improvement back to baseline and tolerance of Diet   Consultants:   General Surgery  Medical Oncology  IR   Procedures:  Thoracentesis 01/02/2019    Antimicrobials:  Anti-infectives (From admission, onward)   Start     Dose/Rate Route Frequency Ordered Stop   01/08/19 1400  ciprofloxacin (CIPRO) IVPB 400 mg     400 mg 200 mL/hr over 60 Minutes Intravenous Every 12 hours 01/08/19 1311     01/08/19 1400  metroNIDAZOLE (FLAGYL) IVPB 500 mg     500 mg 100 mL/hr over 60 Minutes Intravenous Every 8 hours 01/08/19 1311     01/06/19 1215  ciprofloxacin (CIPRO) tablet 500 mg  Status:  Discontinued     500 mg Oral 2 times daily 01/06/19 1200 01/06/19 1201   01/01/19 2200  piperacillin-tazobactam (ZOSYN) IVPB 3.375 g  Status:  Discontinued     3.375 g 12.5 mL/hr over 240 Minutes Intravenous Every 8 hours 01/01/19 1450 01/08/19 1311   01/01/19 1500  piperacillin-tazobactam (ZOSYN) IVPB 3.375 g     3.375 g 100 mL/hr over 30 Minutes Intravenous  Once 01/01/19 1450 01/01/19 2045     Subjective: Seen and examined at bedside she states that she  is not feeling well today and was complaining of right-sided quadrant pain as well as being nauseous and vomiting up a little bit.  No chest pain, lightheadedness or dizziness and states that she did not feel well at all today.  No other  concerns or points at this time.  Objective: Vitals:   01/09/19 2000 01/09/19 2210 01/10/19 0000 01/10/19 0352  BP:  (!) 154/72    Pulse:      Resp:  (!) 30 (!) 23   Temp: (!) 96.8 F (36 C)  98.7 F (37.1 C) 97.9 F (36.6 C)  TempSrc: Axillary  Axillary Oral  SpO2:      Weight:      Height:        Intake/Output Summary (Last 24 hours) at 01/10/2019 0747 Last data filed at 01/09/2019 1500 Gross per 24 hour  Intake 566.13 ml  Output 700 ml  Net -133.87 ml   Filed Weights   01/01/19 0416 01/04/19 1330  Weight: 119.7 kg 119.9 kg   Examination: Physical Exam:  Constitutional: WN/WD overweight African-American female uncomfortable  Eyes: Lids and conjunctivae normal, sclerae anicteric  ENMT: External Ears, Nose appear normal. Grossly normal hearing. Mucous membranes are moist.  Neck: Appears normal, supple, no cervical masses, normal ROM, no appreciable thyromegaly; no JVD Respiratory: Diminished to auscultation bilaterally, no wheezing, rales, rhonchi or crackles. Normal respiratory effort and patient is not tachypenic. No accessory muscle use. Unlabored Breathing  Cardiovascular: Tachycardic, no murmurs / rubs / gallops. S1 and S2 auscultated. 1+ LE extremity edema. Abdomen: Soft, Tender to palpate, Distended due to body habitus.. Bowel sounds positive x4.  GU: Deferred. Musculoskeletal: No clubbing / cyanosis of digits/nails. No joint deformity upper and lower extremities.  Skin: No rashes, lesions, ulcers on a limited skin evaluation. No induration; Warm and dry.  Neurologic: CN 2-12 grossly intact with no focal deficits. Romberg sign and cerebellar reflexes not assessed.  Psychiatric: Normal judgment and insight. Alert and oriented x 3. Anxious mood and appropriate affect.   Data Reviewed: I have personally reviewed following labs and imaging studies  CBC: Recent Labs  Lab 01/04/19 0314 01/05/19 0631  01/07/19 0521 01/08/19 0501 01/09/19 0348 01/09/19 1547  01/10/19 0415  WBC 0.8* 1.4*   < > 4.2 5.8 8.0 10.3 8.0  NEUTROABS 0.2* 0.7*  --   --  5.2  --   --   --   HGB 8.6* 8.6*   < > 9.0* 8.3* 7.8* 8.3* 7.2*  HCT 25.9* 26.3*   < > 27.7* 25.3* 23.5* 24.8* 21.6*  MCV 102.4* 102.7*   < > 103.0* 101.6* 101.3* 101.2* 101.9*  PLT 130* 78*   < > 26* 14* 7* 45* 30*   < > = values in this interval not displayed.   Basic Metabolic Panel: Recent Labs  Lab 01/06/19 0430 01/07/19 0521 01/08/19 0501 01/09/19 0348 01/10/19 0415  NA 141 142 143 142 141  K 3.5 3.7 3.2* 3.0* 2.9*  CL 105 104 106 106 104  CO2 26 24 26 27 27   GLUCOSE 120* 122* 147* 214* 181*  BUN 14 15 21* 23* 15  CREATININE 0.95 0.90 0.96 0.96 0.84  CALCIUM 7.9* 8.0* 8.0* 7.8* 7.8*   GFR: Estimated Creatinine Clearance: 109.7 mL/min (by C-G formula based on SCr of 0.84 mg/dL). Liver Function Tests: Recent Labs  Lab 01/04/19 0314 01/05/19 0631 01/06/19 0430  AST 26 31 35  ALT 14 15 14   ALKPHOS 113 161* 157*  BILITOT 0.7 0.8  0.9  PROT 7.3 7.3 7.7  ALBUMIN 2.7* 2.7* 2.6*   No results for input(s): LIPASE, AMYLASE in the last 168 hours. No results for input(s): AMMONIA in the last 168 hours. Coagulation Profile: No results for input(s): INR, PROTIME in the last 168 hours. Cardiac Enzymes: No results for input(s): CKTOTAL, CKMB, CKMBINDEX, TROPONINI in the last 168 hours. BNP (last 3 results) No results for input(s): PROBNP in the last 8760 hours. HbA1C: No results for input(s): HGBA1C in the last 72 hours. CBG: Recent Labs  Lab 01/09/19 1132 01/09/19 1543 01/09/19 1943 01/09/19 2342 01/10/19 0342  GLUCAP 231* 229* 219* 203* 155*   Lipid Profile: No results for input(s): CHOL, HDL, LDLCALC, TRIG, CHOLHDL, LDLDIRECT in the last 72 hours. Thyroid Function Tests: No results for input(s): TSH, T4TOTAL, FREET4, T3FREE, THYROIDAB in the last 72 hours. Anemia Panel: No results for input(s): VITAMINB12, FOLATE, FERRITIN, TIBC, IRON, RETICCTPCT in the last 72 hours.  Sepsis Labs: No results for input(s): PROCALCITON, LATICACIDVEN in the last 168 hours.  Recent Results (from the past 240 hour(s))  SARS CORONAVIRUS 2 (TAT 6-24 HRS) Nasopharyngeal Nasopharyngeal Swab     Status: None   Collection Time: 01/01/19  6:43 AM   Specimen: Nasopharyngeal Swab  Result Value Ref Range Status   SARS Coronavirus 2 NEGATIVE NEGATIVE Final    Comment: (NOTE) SARS-CoV-2 target nucleic acids are NOT DETECTED. The SARS-CoV-2 RNA is generally detectable in upper and lower respiratory specimens during the acute phase of infection. Negative results do not preclude SARS-CoV-2 infection, do not rule out co-infections with other pathogens, and should not be used as the sole basis for treatment or other patient management decisions. Negative results must be combined with clinical observations, patient history, and epidemiological information. The expected result is Negative. Fact Sheet for Patients: SugarRoll.be Fact Sheet for Healthcare Providers: https://www.woods-mathews.com/ This test is not yet approved or cleared by the [place] FDA and  has been authorized for detection and/or diagnosis of SARS-CoV-2 by FDA under an Emergency Use Authorization (EUA). This EUA will remain  in effect (meaning this test can be used) for the duration of the COVID-19 declaration under Section 56 4(b)(1) of the Act, 21 U.S.C. section 360bbb-3(b)(1), unless the authorization is terminated or revoked sooner. Performed at [facility], [place] [address], [place], [place] [address]   Urine culture     Status: None   Collection Time: 01/01/19  9:31 AM   Specimen: Urine, Catheterized  Result Value Ref Range Status   Specimen Description   Final    URINE, CATHETERIZED Performed at [facility] [address], [place], [place] [address]    Special Requests   Final    NONE Performed at [facility],  [place] [address] [place], [place] [address]    Culture   Final    NO GROWTH Performed at [facility], [place] [address] [place], [place] [address]    Report Status 01/03/2019 FINAL  Final  Culture, blood (x 2)     Status: None   Collection Time: 01/01/19  9:03 PM   Specimen: BLOOD  Result Value Ref Range Status   Specimen Description   Final    BLOOD LEFT ANTECUBITAL Performed at [facility] [address] [place], [place] [address]    Special Requests   Final    BOTTLES DRAWN AEROBIC AND ANAEROBIC Blood Culture adequate volume Performed at [facility] [address]  [place], [place] [address]    Culture   Final    NO GROWTH 5 DAYS Performed at [facility], [place] [address], [place], [place] [address]    Report Status 01/07/2019 FINAL  Final  Culture, blood (x 2)     Status: None   Collection Time: 01/01/19  9:03 PM   Specimen: BLOOD  Result Value Ref Range Status   Specimen Description   Final    BLOOD RIGHT ANTECUBITAL Performed at [facility] [address], [place], [place] [address]    Special Requests   Final    BOTTLES DRAWN AEROBIC AND ANAEROBIC Blood Culture adequate volume Performed at [facility] [address], [place], [place] [address]    Culture   Final    NO GROWTH 5 DAYS Performed at [facility], [place] [address], [place], [place] [address]    Report Status 01/07/2019 FINAL  Final  Culture, body fluid-bottle     Status: None   Collection Time: 01/02/19 12:40 PM   Specimen: Pleura  Result Value Ref Range Status   Specimen Description PLEURAL LEFT  Final   Special Requests NONE  Final   Culture   Final    NO GROWTH 5 DAYS Performed at [facility], [address] [place], [place] [address]    Report Status 01/07/2019 FINAL  Final  Gram stain     Status: None   Collection Time: 01/02/19 12:40 PM   Specimen: Pleura  Result Value Ref Range  Status   Specimen Description PLEURAL LEFT  Final   Special Requests NONE  Final   Gram Stain   Final    MODERATE WBC PRESENT,BOTH PMN AND MONONUCLEAR NO ORGANISMS SEEN Performed at [facility], [address] [place], [place] [address]    Report Status 01/02/2019 FINAL  Final  MRSA PCR Screening     Status: Abnormal   Collection Time: 01/04/19  1:39 PM   Specimen: Nasal Mucosa; Nasopharyngeal  Result Value Ref Range Status   MRSA by PCR POSITIVE (A) NEGATIVE Final    Comment:        The GeneXpert MRSA Assay (FDA approved for NASAL specimens only), is one component of a comprehensive MRSA colonization surveillance program. It is not intended to diagnose MRSA infection nor to guide or monitor treatment for MRSA infections. RESULT CALLED TO, READ BACK BY AND VERIFIED WITH: Lorna Dibble [id number] @ [address] Performed at [facility] [address], [place], [place] [address]      RN Pressure Injury Documentation: Pressure Injury 11/16/18 Buttocks Right;Medial Stage II -  Partial thickness loss of dermis presenting as a shallow open ulcer with a red, pink wound bed without slough. right inner buttock (Active)  11/16/18 1815  Location: Buttocks  Location Orientation: Right;Medial  Staging: Stage II -  Partial thickness loss of dermis presenting as a shallow open ulcer with a red, pink wound bed without slough.  Wound Description (Comments): right inner buttock  Present on Admission: Yes     Estimated body mass index is 39.03 kg/m as calculated from the following:   Height as of this encounter: 5\' 9"  (1.753 m).   Weight as of this encounter: 119.9 kg.  Malnutrition Type:  Nutrition Problem: Increased nutrient needs Etiology: chronic illness, cancer and cancer related treatments   Malnutrition Characteristics:  Signs/Symptoms: estimated needs   Nutrition Interventions:  Interventions: Boost Breeze, Ensure Enlive (each  supplement  provides 350kcal and 20 grams of protein), MVI    Radiology Studies: No results found.   Scheduled Meds: . sodium chloride   Intravenous Once  . atorvastatin  40 mg Oral q1800  . docusate sodium  100 mg Oral BID  . DULoxetine  60 mg Oral Daily  . feeding supplement  1 Container Oral BID BM  . feeding supplement (ENSURE ENLIVE)  237 mL Oral BID BM  . folic acid  1 mg Oral Daily  . insulin aspart  0-9 Units Subcutaneous Q4H  . insulin glargine  20 Units Subcutaneous QAC breakfast  . levothyroxine  150 mcg Oral QAC breakfast  . mouth rinse  15 mL Mouth Rinse BID  . metoprolol succinate  25 mg Oral QPM  . metoprolol succinate  50 mg Oral Daily  . multivitamin with minerals  1 tablet Oral Daily  . pantoprazole  40 mg Oral Daily  . polyethylene glycol  17 g Oral BID  . senna-docusate  2 tablet Oral BID  . vitamin B-12  1,000 mcg Oral Daily   Continuous Infusions: . sodium chloride    . sodium chloride Stopped (01/07/19 0455)  . ciprofloxacin Stopped (01/10/19 0422)  . metronidazole 500 mg (01/10/19 0605)    LOS: 8 days    Kerney Elbe, DO [facility] PAGER is on [place]  If 7PM-7AM, please contact night-coverage [email or web address] Password [facility] 01/10/2019, 7:47 AM

## 2019-01-11 LAB — GLUCOSE, CAPILLARY
Glucose-Capillary: 129 mg/dL — ABNORMAL HIGH (ref 70–99)
Glucose-Capillary: 149 mg/dL — ABNORMAL HIGH (ref 70–99)
Glucose-Capillary: 195 mg/dL — ABNORMAL HIGH (ref 70–99)
Glucose-Capillary: 215 mg/dL — ABNORMAL HIGH (ref 70–99)
Glucose-Capillary: 227 mg/dL — ABNORMAL HIGH (ref 70–99)
Glucose-Capillary: 316 mg/dL — ABNORMAL HIGH (ref 70–99)

## 2019-01-11 LAB — CBC WITH DIFFERENTIAL/PLATELET
Abs Immature Granulocytes: 0.23 10*3/uL — ABNORMAL HIGH (ref 0.00–0.07)
Basophils Absolute: 0.1 10*3/uL (ref 0.0–0.1)
Basophils Relative: 1 %
Eosinophils Absolute: 0 10*3/uL (ref 0.0–0.5)
Eosinophils Relative: 0 %
HCT: 31.4 % — ABNORMAL LOW (ref 36.0–46.0)
Hemoglobin: 10.8 g/dL — ABNORMAL LOW (ref 12.0–15.0)
Immature Granulocytes: 3 %
Lymphocytes Relative: 8 %
Lymphs Abs: 0.7 10*3/uL (ref 0.7–4.0)
MCH: 32.8 pg (ref 26.0–34.0)
MCHC: 34.4 g/dL (ref 30.0–36.0)
MCV: 95.4 fL (ref 80.0–100.0)
Monocytes Absolute: 0.4 10*3/uL (ref 0.1–1.0)
Monocytes Relative: 5 %
Neutro Abs: 7.1 10*3/uL (ref 1.7–7.7)
Neutrophils Relative %: 83 %
Platelets: 17 10*3/uL — CL (ref 150–400)
RBC: 3.29 MIL/uL — ABNORMAL LOW (ref 3.87–5.11)
RDW: 18.3 % — ABNORMAL HIGH (ref 11.5–15.5)
WBC: 8.5 10*3/uL (ref 4.0–10.5)
nRBC: 0 % (ref 0.0–0.2)

## 2019-01-11 LAB — COMPREHENSIVE METABOLIC PANEL
ALT: 62 U/L — ABNORMAL HIGH (ref 0–44)
AST: 217 U/L — ABNORMAL HIGH (ref 15–41)
Albumin: 2.6 g/dL — ABNORMAL LOW (ref 3.5–5.0)
Alkaline Phosphatase: 192 U/L — ABNORMAL HIGH (ref 38–126)
Anion gap: 12 (ref 5–15)
BUN: 9 mg/dL (ref 6–20)
CO2: 26 mmol/L (ref 22–32)
Calcium: 8.1 mg/dL — ABNORMAL LOW (ref 8.9–10.3)
Chloride: 102 mmol/L (ref 98–111)
Creatinine, Ser: 0.76 mg/dL (ref 0.44–1.00)
GFR calc Af Amer: >60 mL/min (ref >60)
GFR calc non Af Amer: >60 mL/min (ref >60)
Glucose, Bld: 147 mg/dL — ABNORMAL HIGH (ref 70–99)
Potassium: 3.4 mmol/L — ABNORMAL LOW (ref 3.5–5.1)
Sodium: 140 mmol/L (ref 135–145)
Total Bilirubin: 1.8 mg/dL — ABNORMAL HIGH (ref 0.3–1.2)
Total Protein: 6.7 g/dL (ref 6.5–8.1)

## 2019-01-11 LAB — PHOSPHORUS: Phosphorus: 2 mg/dL — ABNORMAL LOW (ref 2.5–4.6)

## 2019-01-11 LAB — MAGNESIUM: Magnesium: 1.5 mg/dL — ABNORMAL LOW (ref 1.7–2.4)

## 2019-01-11 MED ORDER — MAGNESIUM SULFATE 2 GM/50ML IV SOLN
2.0000 g | Freq: Once | INTRAVENOUS | Status: AC
  Administered 2019-01-11: 2 g via INTRAVENOUS
  Filled 2019-01-11: qty 50

## 2019-01-11 MED ORDER — POTASSIUM CHLORIDE CRYS ER 20 MEQ PO TBCR
40.0000 meq | EXTENDED_RELEASE_TABLET | Freq: Once | ORAL | Status: AC
  Administered 2019-01-11: 40 meq via ORAL
  Filled 2019-01-11: qty 2

## 2019-01-11 MED ORDER — SODIUM CHLORIDE 0.9% IV SOLUTION
Freq: Once | INTRAVENOUS | Status: AC
  Administered 2019-01-11: 18:00:00 via INTRAVENOUS

## 2019-01-11 MED ORDER — POTASSIUM PHOSPHATES 15 MMOLE/5ML IV SOLN
20.0000 mmol | Freq: Once | INTRAVENOUS | Status: AC
  Administered 2019-01-11: 20 mmol via INTRAVENOUS
  Filled 2019-01-11: qty 6.67

## 2019-01-11 NOTE — Progress Notes (Signed)
PROGRESS NOTE    Marilyn [name]  XTA:[id number] DOB: June 23, 1965 DOA: 01/01/2019 PCP: [name], FNP  Brief Narrative:  The patient is a 53 year old morbidly obese African-American female with a past medical history significant for metastatic adenocarcinoma, nonischemic cardiomyopathy, hypertension, hypothyroidism, history of malignant pleural effusion status post thoracentesis x3, hypothyroidism as well as other comorbidities who presented with a chief complaint of right-sided abdominal pain.  She reported the pain was going on for the last 3 months for last 3 days and acutely worsened.  She is currently on chemotherapy and her last infusion was on 12/27/2018 with Avastin, Alimta, carboplatin.  Initial CT and ultrasound noted gallbladder wall thickening, gallstones and HIDA scan was done which noted chronic cholecystitis.  Situation was complicated by neutropenia from chemotherapy.  General surgery evaluated and recommended she is a poor surgical candidate given her metastatic cancer recommended medical therapy as well as continue antibiotics for now and follow-up in outpatient setting.  They recommend advancing diet as tolerated and IR was unable to drain her gallbladder due to being contracted general surgery recommended symptomatic treatment course of antibiotics and follow-up as an outpatient to discuss elective cholecystectomy when she is completed chemotherapy as needed.  Hospitalization has been complicated by constipation and severe thrombocytopenia and now some hematuria which is worsening.  **Interim History on 01/10/2019 General surgery has signed off and PT OT not able to be done as patient was feeling a little bit dizzy and nauseous.  Hemoglobin hematocrit is trending down and will obtain a urinalysis and urine culture given her hematuria now.  Patient recently received subcu heparin which is now been stopped.  Will need to transfuse PRBCs given worsening hemoglobin.  Patient felt a  little bit better today so her diet was advanced to soft diet.  Platelet counts remain low still.  Assessment & Plan:   Active Problems:   Diabetes mellitus without complication (HCC)   Adenocarcinoma of left lung, stage 3 (HCC)   Port catheter in place   Sinus tachycardia   Hypothyroidism   Non-ischemic cardiomyopathy (HCC)   Essential hypertension   HLD (hyperlipidemia)   Depression   Chronic diastolic CHF (congestive heart failure) (HCC)   Recurrent right pleural effusion   Metastatic adenocarcinoma to lung, right (HCC)   Cholecystitis, acute   Biliary colic   Chemotherapy-induced neutropenia (HCC)  Acute on chronic cholecystitis  -Admitted with severe right upper quadrant abdominal pain, worsening from baseline Initial imaging studies with CT and ultrasound noted gallbladder wall thickening, gallstones, HIDA scan showed evidence of chronic cholecystitis   -Treated initially with bowel rest, IV Zosyn, complicated by neutropenia from recent chemotherapy -Repeat CT abdomen pelvis 10/1 without acute findings, gallbladder wall thickening appears to have improved,  -Pain appears to be reportedly a little better, continues to have some nausea today unable to eat or drink very much -Appreciate general surgery input, she is not a surgical candidate given metastatic cancer, recent Chemotherapy especially Avastin on 9/23 -Completed 6 days of empiric Zosyn, given worsening thrombocytopenia 5 days ago was changed to IV Cipro and Flagyl yesterday 10/5 and will change to po for a total of 14-day length of treatment -Encouraged liquid diet as yesterday tolerated, wean IV narcotics; ADAT and now diet was advanced to a soft diet -Discussed with IR last week, PERC drain not an option since her gallbladder is contracted -Continue laxative, out of bed, Dulcolax suppository  -Ambulate and OOB  Malignant recurrent right pleural effusion -Patient has metastatic adenocarcinoma of  the lung and has  recurrent right malignant effusion -Pt is s/p thoracentesis 9/29, yielding 920cc fluid, cytology positive for malignant effusion -Follow-up intermittent x-rays as needed and repeat chest x-ray yesterday showed unchanged right suprahilar consolidation and atelectasis and right pleural effusion which was associated with atelectasis has decreased to 01/04/2019 no evidence of pneumothorax  Metastatic Non-small Cell Lung Cancer. -Followed by Dr. Irene Limbo -Currently on chemotherapy with carboplatin, Alimta and Avastin. Last cycle on 12/27/2018 -Oncology following and appreciate further evaluation and Recc's  Thrombocytopenia  -Due to sepsis, chemotherapy, worsened by recent heparin use; Now platelet Count is dropping again and 17,000 -Heparin discontinued 10/3 -Oncology following -Unfortunately platelet count is trended down further now 7K, HIT antibody panel is negative, SRA pending -My Partener Dr. Josephina Shih and discussed with patient's oncologist, Dr.Kale day before yesterday, he feels that lingering effects from recent chemotherapy could be contributing to her worsening thrombocytopenia as well and beta-lactam's, Zosyn potentially also worsening this, hence stopped IV PIP tazo, started Cipro Flagyl, check immature platelet fraction is within normal range, -Transfused 1 unit of platelets during hospitalization; Has some Hematuria  -Monitor CBC  Macrocyctic Anemia/ABLA from Hematuria -Worsening Hgb/Hct and trended down to 7.2/21.6 yesterday -Type and Screen and Transfuse 2 units of pRBC's; Repeat CBC this AM showed Hgb/Hct to be 10.8/31.4 -In the setting of Hematuria and recent Chemo as well as sq Heparin use -Continue to Monitor for S/Sx of Bleeding; Having hematuria per nurse and will obtain a UA and Urine Cx -Repeat CBC in AM   Depression. -Continue on Cymbalta  Acute on chronic diastolic dysfunction. -Prior history of nonischemic cardiomyopathy showed normal EF -Given Lasix few days  ago, monitor, dose periodically -Will give 2 units and IV 40 mg Lasix in between -Strict I's/O's and Daily Weights -She is -+1,065 mL since admission  -Continue to Monitor for S/Sx of Volume Overload -Repeat CXR in AM   Hypokalemia -Patient's K+ was 3.4 -Replete with po KCl 40 mEQ, and IV K Phos 20 mmol -Continue to Monitor and Replete as necessary -Repeat CMP in AM  Hypophosphatemia -Patient's Phos Level was 2.0 -Replete with IV K Phos 20 mmol -Continue to Monitor and Replete as Necessary -Repeat Phos Level in AM   Hypomagnesemia  -Patient's Mag Level this AM was 1.5 -Replete with IV Mag Sulfate 2 grams -Continue to Monitor and Replete as Necessary -Repeat Mag Level in AM  Hematuria -Patient's UA showed clear appearance with 50 glucose, large hemoglobin, trace leukocytes, rare bacteria, 21-50 RBCs per high-power field, 11-20 WBCs, and urine culture still pending -Nurse reports no Hematuria today   Hyperbilirubinemia  -Patient's T bili this morning was 1.8 -Likely reactive in the setting of chronic cholecystitis -Continue to monitor and trend and repeat CMP in a.m.  Type 2 Diabetes Mellitus,poorlycontroled withoutcomplication -Last hemoglobin A1c was10. -Continue low-dose Lantus, CBGs are stable and ranging from 149-259  Obesity -Estimated body mass index is 39.3 kg/m as calculated from the following:   Height as of this encounter: 5\' 9"  (1.753 m).   Weight as of this encounter: 120.7 kg. -Weight Loss and Dietary Counseling given  -Nutritionist consulted and recommending Ensure Enlive BID, Boost Breeze BID, MVI with Minerals and encouraging po Intake   DVT prophylaxis: SCDs Code Status: FULL CODE  Family Communication: No family present at bedside  Disposition Plan: Pending Clinical Improvement back to baseline and tolerance of Diet; Transfer to Medical Floor today   Consultants:   General Surgery  Medical Oncology  IR  Procedures:  Thoracentesis  01/02/2019    Antimicrobials:  Anti-infectives (From admission, onward)   Start     Dose/Rate Route Frequency Ordered Stop   01/08/19 1400  ciprofloxacin (CIPRO) IVPB 400 mg     400 mg 200 mL/hr over 60 Minutes Intravenous Every 12 hours 01/08/19 1311     01/08/19 1400  metroNIDAZOLE (FLAGYL) IVPB 500 mg     500 mg 100 mL/hr over 60 Minutes Intravenous Every 8 hours 01/08/19 1311     01/06/19 1215  ciprofloxacin (CIPRO) tablet 500 mg  Status:  Discontinued     500 mg Oral 2 times daily 01/06/19 1200 01/06/19 1201   01/01/19 2200  piperacillin-tazobactam (ZOSYN) IVPB 3.375 g  Status:  Discontinued     3.375 g 12.5 mL/hr over 240 Minutes Intravenous Every 8 hours 01/01/19 1450 01/08/19 1311   01/01/19 1500  piperacillin-tazobactam (ZOSYN) IVPB 3.375 g     3.375 g 100 mL/hr over 30 Minutes Intravenous  Once 01/01/19 1450 01/01/19 2045     Subjective: Seen and examined at bedside and states that she was feeling a little bit better today and wanted to try a softer diet.  No nausea or vomiting but still has some abdominal pain.  Nursing reported no hematuria today but patient states that she still may have had some but does not "know".  Denies any other concerns or complaints at this time.  Objective: Vitals:   01/11/19 0300 01/11/19 0424 01/11/19 0515 01/11/19 0620  BP: (!) 175/96 (!) 172/91 (!) 159/89   Pulse: (!) 109     Resp: (!) 30  (!) 23   Temp: 97.6 F (36.4 C)  98.3 F (36.8 C) 98.1 F (36.7 C)  TempSrc: Oral  Oral   SpO2: 97%     Weight:   120.7 kg   Height:        Intake/Output Summary (Last 24 hours) at 01/11/2019 0745 Last data filed at 01/11/2019 1245 Gross per 24 hour  Intake 1540.31 ml  Output 2325 ml  Net -784.69 ml   Filed Weights   01/01/19 0416 01/04/19 1330 01/11/19 0515  Weight: 119.7 kg 119.9 kg 120.7 kg   Examination: Physical Exam:  Constitutional: WN/WD obese AAF in NAD and appears calm and and resting  Eyes: Lids and conjunctivae normal,  sclerae anicteric  ENMT: External Ears, Nose appear normal. Grossly normal hearing.  Neck: Appears normal, supple, no cervical masses, normal ROM, no appreciable thyromegaly; no JVD Respiratory: Diminished to auscultation bilaterally, no wheezing, rales, rhonchi or crackles. Normal respiratory effort and patient is not tachypenic. No accessory muscle use.  Cardiovascular: RRR, no murmurs / rubs / gallops. S1 and S2 auscultated. Trace extremity edema. Abdomen: Soft, Tender to palpate, Distended due to body habitus. Bowel sounds positive x4.  GU: Deferred. Musculoskeletal: No clubbing / cyanosis of digits/nails. No joint deformity upper and lower extremities.  Skin: No rashes, lesions, ulcers on a limited skin evaluation. No induration; Warm and dry.  Neurologic: CN 2-12 grossly intact with no focal deficits. Romberg sign cerebellar and reflexes not assessed.  Psychiatric: Normal judgment and insight. Was a little somnolent but arousable. Normal mood and appropriate affect.   Data Reviewed: I have personally reviewed following labs and imaging studies  CBC: Recent Labs  Lab 01/05/19 0631  01/08/19 0501 01/09/19 0348 01/09/19 1547 01/10/19 0415 01/10/19 1500 01/11/19 0608  WBC 1.4*   < > 5.8 8.0 10.3 8.0  --  8.5  NEUTROABS 0.7*  --  5.2  --   --   --   --  7.1  HGB 8.6*   < > 8.3* 7.8* 8.3* 7.2* 7.4* 10.8*  HCT 26.3*   < > 25.3* 23.5* 24.8* 21.6* 23.2* 31.4*  MCV 102.7*   < > 101.6* 101.3* 101.2* 101.9*  --  95.4  PLT 78*   < > 14* 7* 45* 30*  --  17*   < > = values in this interval not displayed.   Basic Metabolic Panel: Recent Labs  Lab 01/07/19 0521 01/08/19 0501 01/09/19 0348 01/10/19 0415 01/11/19 0608  NA 142 143 142 141 140  K 3.7 3.2* 3.0* 2.9* 3.4*  CL 104 106 106 104 102  CO2 24 26 27 27 26   GLUCOSE 122* 147* 214* 181* 147*  BUN 15 21* 23* 15 9  CREATININE 0.90 0.96 0.96 0.84 0.76  CALCIUM 8.0* 8.0* 7.8* 7.8* 8.1*  MG  --   --   --  1.7 1.5*  PHOS  --   --    --  1.1* 2.0*   GFR: Estimated Creatinine Clearance: 115.6 mL/min (by C-G formula based on SCr of 0.76 mg/dL). Liver Function Tests: Recent Labs  Lab 01/05/19 0631 01/06/19 0430 01/11/19 0608  AST 31 35 217*  ALT 15 14 62*  ALKPHOS 161* 157* 192*  BILITOT 0.8 0.9 1.8*  PROT 7.3 7.7 6.7  ALBUMIN 2.7* 2.6* 2.6*   No results for input(s): LIPASE, AMYLASE in the last 168 hours. No results for input(s): AMMONIA in the last 168 hours. Coagulation Profile: No results for input(s): INR, PROTIME in the last 168 hours. Cardiac Enzymes: No results for input(s): CKTOTAL, CKMB, CKMBINDEX, TROPONINI in the last 168 hours. BNP (last 3 results) No results for input(s): PROBNP in the last 8760 hours. HbA1C: No results for input(s): HGBA1C in the last 72 hours. CBG: Recent Labs  Lab 01/10/19 1604 01/10/19 2116 01/11/19 0007 01/11/19 0421 01/11/19 0735  GLUCAP 269* 259* 195* 129* 149*   Lipid Profile: No results for input(s): CHOL, HDL, LDLCALC, TRIG, CHOLHDL, LDLDIRECT in the last 72 hours. Thyroid Function Tests: No results for input(s): TSH, T4TOTAL, FREET4, T3FREE, THYROIDAB in the last 72 hours. Anemia Panel: No results for input(s): VITAMINB12, FOLATE, FERRITIN, TIBC, IRON, RETICCTPCT in the last 72 hours. Sepsis Labs: No results for input(s): PROCALCITON, LATICACIDVEN in the last 168 hours.  Recent Results (from the past 240 hour(s))  Urine culture     Status: None   Collection Time: 01/01/19  9:31 AM   Specimen: Urine, Catheterized  Result Value Ref Range Status   Specimen Description   Final    URINE, CATHETERIZED Performed at [facility] [address], [place], [place] [address]    Special Requests   Final    NONE Performed at [facility], [place] [address] [place], [place] [address]    Culture   Final    NO GROWTH Performed at [facility], [place] [address], [place], [place] [address]    Report Status 01/03/2019 FINAL   Final  Culture, blood (x 2)     Status: None   Collection Time: 01/01/19  9:03 PM   Specimen: BLOOD  Result Value Ref Range Status   Specimen Description   Final    BLOOD LEFT ANTECUBITAL Performed at [facility] [address], [place], [place] [address]    Special Requests   Final    BOTTLES DRAWN AEROBIC AND ANAEROBIC Blood Culture adequate volume Performed at [facility] [address]  [place], [place] [address]    Culture   Final    NO GROWTH 5 DAYS Performed at [facility], [place] [address] [place], [place] [address]    Report Status 01/07/2019 FINAL  Final  Culture, blood (x 2)     Status: None   Collection Time: 01/01/19  9:03 PM   Specimen: BLOOD  Result Value Ref Range Status   Specimen Description   Final    BLOOD RIGHT ANTECUBITAL Performed at [facility] [address] [place], [place] [address]    Special Requests   Final    BOTTLES DRAWN AEROBIC AND ANAEROBIC Blood Culture adequate volume Performed at [facility] [address], [place], [place] [address]    Culture   Final    NO GROWTH 5 DAYS Performed at [facility], [place] [address], [place], [place] [address]    Report Status 01/07/2019 FINAL  Final  Culture, body fluid-bottle     Status: None   Collection Time: 01/02/19 12:40 PM   Specimen: Pleura  Result Value Ref Range Status   Specimen Description PLEURAL LEFT  Final   Special Requests NONE  Final   Culture   Final    NO GROWTH 5 DAYS Performed at [facility], [address] [place], [place] [address]    Report Status 01/07/2019 FINAL  Final  Gram stain     Status: None   Collection Time: 01/02/19 12:40 PM   Specimen: Pleura  Result Value Ref Range Status   Specimen Description PLEURAL LEFT  Final   Special Requests NONE  Final   Gram Stain   Final    MODERATE WBC PRESENT,BOTH PMN AND MONONUCLEAR NO ORGANISMS SEEN Performed at [facility], [address] [place], [place] [address]    Report Status 01/02/2019 FINAL  Final  MRSA PCR Screening     Status: Abnormal   Collection Time: 01/04/19  1:39 PM   Specimen: Nasal Mucosa; Nasopharyngeal  Result Value Ref Range Status   MRSA by PCR POSITIVE (A) NEGATIVE Final    Comment:        The GeneXpert MRSA Assay (FDA approved for NASAL specimens only), is one component of a comprehensive MRSA colonization surveillance program. It is not intended to diagnose MRSA infection nor to guide or monitor treatment for MRSA infections. RESULT CALLED TO, READ BACK BY AND VERIFIED WITH: Lorna Dibble [id number] @ [address] Performed at [facility] [address], [place], [place] [address]      RN Pressure Injury Documentation: Pressure Injury 11/16/18 Buttocks Right;Medial Stage II -  Partial thickness loss of dermis presenting as a shallow open ulcer with a red, pink wound bed without slough. right inner buttock (Active)  11/16/18 1815  Location: Buttocks  Location Orientation: Right;Medial  Staging: Stage II -  Partial thickness loss of dermis presenting as a shallow open ulcer with a red, pink wound bed without slough.  Wound Description (Comments): right inner buttock  Present on Admission: Yes     Estimated body mass index is 39.3 kg/m as calculated from the following:   Height as of this encounter: 5\' 9"  (1.753 m).   Weight as of this encounter: 120.7 kg.  Malnutrition Type:  Nutrition Problem: Increased nutrient needs Etiology: chronic illness, cancer and cancer related treatments   Malnutrition Characteristics:  Signs/Symptoms: estimated needs   Nutrition Interventions:  Interventions: Boost Breeze, Ensure Enlive (each  supplement provides 350kcal and 20 grams of protein), MVI    Radiology Studies: Dg Chest Port 1 View  Result Date: 01/10/2019 CLINICAL DATA:  Shortness of breath EXAM: PORTABLE CHEST 1 VIEW  COMPARISON:  Chest radiograph dated 01/04/2019 FINDINGS: The heart size and mediastinal contours are within normal limits. Right suprahilar consolidation/atelectasis appears unchanged. A right pleural effusion with associated atelectasis has decreased since 01/04/2019. The left lung is clear. There is no pneumothorax. The visualized skeletal structures are unremarkable. A right internal jugular central venous port catheter tip overlies the superior vena cava. IMPRESSION: 1. Unchanged right suprahilar consolidation/atelectasis. 2. Right pleural effusion with associated atelectasis has decreased since 01/04/2019. No pneumothorax. Electronically Signed   By: Zerita Boers M.D.   On: 01/10/2019 12:59     Scheduled Meds: . sodium chloride   Intravenous Once  . sodium chloride   Intravenous Once  . atorvastatin  40 mg Oral q1800  . Chlorhexidine Gluconate Cloth  6 each Topical Daily  . docusate sodium  100 mg Oral BID  . DULoxetine  60 mg Oral Daily  . feeding supplement  1 Container Oral BID BM  . feeding supplement (ENSURE ENLIVE)  237 mL Oral BID BM  . folic acid  1 mg Oral Daily  . insulin aspart  0-9 Units Subcutaneous Q4H  . insulin glargine  20 Units Subcutaneous QAC breakfast  . levothyroxine  150 mcg Oral QAC breakfast  . mouth rinse  15 mL Mouth Rinse BID  . metoprolol succinate  25 mg Oral QPM  . metoprolol succinate  50 mg Oral Daily  . multivitamin with minerals  1 tablet Oral Daily  . pantoprazole  40 mg Oral Daily  . polyethylene glycol  17 g Oral BID  . potassium chloride  40 mEq Oral Once  . senna-docusate  2 tablet Oral BID  . vitamin B-12  1,000 mcg Oral Daily   Continuous Infusions: . sodium chloride    . sodium chloride Stopped (01/07/19 0455)  . ciprofloxacin Stopped (01/11/19 0300)  . magnesium sulfate bolus IVPB    . metronidazole Stopped (01/11/19 2174)  . potassium PHOSPHATE IVPB (in mmol)      LOS: 9 days    Kerney Elbe, DO [facility] PAGER is  on [place]  If 7PM-7AM, please contact night-coverage [email or web address] Password [email or web address] 01/11/2019, 7:45 AM

## 2019-01-12 ENCOUNTER — Inpatient Hospital Stay (HOSPITAL_COMMUNITY): Payer: Medicare Other

## 2019-01-12 DIAGNOSIS — Z1612 Extended spectrum beta lactamase (ESBL) resistance: Secondary | ICD-10-CM

## 2019-01-12 DIAGNOSIS — N390 Urinary tract infection, site not specified: Secondary | ICD-10-CM

## 2019-01-12 DIAGNOSIS — B9629 Other Escherichia coli [E. coli] as the cause of diseases classified elsewhere: Secondary | ICD-10-CM

## 2019-01-12 LAB — COMPREHENSIVE METABOLIC PANEL
ALT: 76 U/L — ABNORMAL HIGH (ref 0–44)
AST: 219 U/L — ABNORMAL HIGH (ref 15–41)
Albumin: 2.6 g/dL — ABNORMAL LOW (ref 3.5–5.0)
Alkaline Phosphatase: 190 U/L — ABNORMAL HIGH (ref 38–126)
Anion gap: 10 (ref 5–15)
BUN: 9 mg/dL (ref 6–20)
CO2: 25 mmol/L (ref 22–32)
Calcium: 7.8 mg/dL — ABNORMAL LOW (ref 8.9–10.3)
Chloride: 101 mmol/L (ref 98–111)
Creatinine, Ser: 0.77 mg/dL (ref 0.44–1.00)
GFR calc Af Amer: >60 mL/min (ref >60)
GFR calc non Af Amer: >60 mL/min (ref >60)
Glucose, Bld: 195 mg/dL — ABNORMAL HIGH (ref 70–99)
Potassium: 3.6 mmol/L (ref 3.5–5.1)
Sodium: 136 mmol/L (ref 135–145)
Total Bilirubin: 0.9 mg/dL (ref 0.3–1.2)
Total Protein: 6.3 g/dL — ABNORMAL LOW (ref 6.5–8.1)

## 2019-01-12 LAB — BPAM PLATELET PHERESIS
Blood Product Expiration Date: 202010082359
ISSUE DATE / TIME: 202010081828
Unit Type and Rh: 600

## 2019-01-12 LAB — CBC WITH DIFFERENTIAL/PLATELET
Abs Immature Granulocytes: 0.13 10*3/uL — ABNORMAL HIGH (ref 0.00–0.07)
Basophils Absolute: 0 10*3/uL (ref 0.0–0.1)
Basophils Relative: 1 %
Eosinophils Absolute: 0 10*3/uL (ref 0.0–0.5)
Eosinophils Relative: 0 %
HCT: 30.5 % — ABNORMAL LOW (ref 36.0–46.0)
Hemoglobin: 10.2 g/dL — ABNORMAL LOW (ref 12.0–15.0)
Immature Granulocytes: 2 %
Lymphocytes Relative: 8 %
Lymphs Abs: 0.5 10*3/uL — ABNORMAL LOW (ref 0.7–4.0)
MCH: 32 pg (ref 26.0–34.0)
MCHC: 33.4 g/dL (ref 30.0–36.0)
MCV: 95.6 fL (ref 80.0–100.0)
Monocytes Absolute: 0.6 10*3/uL (ref 0.1–1.0)
Monocytes Relative: 9 %
Neutro Abs: 5.3 10*3/uL (ref 1.7–7.7)
Neutrophils Relative %: 80 %
Platelets: 37 10*3/uL — ABNORMAL LOW (ref 150–400)
RBC: 3.19 MIL/uL — ABNORMAL LOW (ref 3.87–5.11)
RDW: 18.2 % — ABNORMAL HIGH (ref 11.5–15.5)
WBC: 6.6 10*3/uL (ref 4.0–10.5)
nRBC: 0 % (ref 0.0–0.2)

## 2019-01-12 LAB — BPAM RBC
Blood Product Expiration Date: 202010282359
Blood Product Expiration Date: 202010282359
ISSUE DATE / TIME: 202010072234
ISSUE DATE / TIME: 202010080234
Unit Type and Rh: 6200
Unit Type and Rh: 6200

## 2019-01-12 LAB — PREPARE PLATELET PHERESIS: Unit division: 0

## 2019-01-12 LAB — TYPE AND SCREEN
ABO/RH(D): A POS
Antibody Screen: NEGATIVE
Unit division: 0
Unit division: 0

## 2019-01-12 LAB — URINE CULTURE: Culture: 80000 — AB

## 2019-01-12 LAB — GLUCOSE, CAPILLARY
Glucose-Capillary: 152 mg/dL — ABNORMAL HIGH (ref 70–99)
Glucose-Capillary: 157 mg/dL — ABNORMAL HIGH (ref 70–99)
Glucose-Capillary: 187 mg/dL — ABNORMAL HIGH (ref 70–99)
Glucose-Capillary: 189 mg/dL — ABNORMAL HIGH (ref 70–99)
Glucose-Capillary: 195 mg/dL — ABNORMAL HIGH (ref 70–99)
Glucose-Capillary: 206 mg/dL — ABNORMAL HIGH (ref 70–99)

## 2019-01-12 LAB — MAGNESIUM: Magnesium: 1.6 mg/dL — ABNORMAL LOW (ref 1.7–2.4)

## 2019-01-12 LAB — PHOSPHORUS: Phosphorus: 2.7 mg/dL (ref 2.5–4.6)

## 2019-01-12 MED ORDER — SODIUM CHLORIDE 0.9 % IV SOLN
1.0000 g | Freq: Three times a day (TID) | INTRAVENOUS | Status: DC
  Filled 2019-01-12 (×2): qty 1

## 2019-01-12 MED ORDER — METRONIDAZOLE IN NACL 5-0.79 MG/ML-% IV SOLN
500.0000 mg | Freq: Three times a day (TID) | INTRAVENOUS | Status: DC
  Administered 2019-01-12: 500 mg via INTRAVENOUS
  Filled 2019-01-12: qty 100

## 2019-01-12 MED ORDER — SODIUM CHLORIDE 0.9 % IV SOLN
2.0000 g | INTRAVENOUS | Status: DC
  Administered 2019-01-12: 2 g via INTRAVENOUS
  Filled 2019-01-12: qty 2

## 2019-01-12 MED ORDER — SODIUM CHLORIDE 0.9 % IV SOLN
1.0000 g | Freq: Three times a day (TID) | INTRAVENOUS | Status: DC
  Administered 2019-01-13 – 2019-01-18 (×16): 1 g via INTRAVENOUS
  Filled 2019-01-12 (×21): qty 1

## 2019-01-12 MED ORDER — MAGNESIUM SULFATE 2 GM/50ML IV SOLN
2.0000 g | Freq: Once | INTRAVENOUS | Status: AC
  Administered 2019-01-12: 2 g via INTRAVENOUS
  Filled 2019-01-12: qty 50

## 2019-01-12 MED ORDER — K PHOS MONO-SOD PHOS DI & MONO 155-852-130 MG PO TABS
500.0000 mg | ORAL_TABLET | Freq: Two times a day (BID) | ORAL | Status: AC
  Administered 2019-01-12 (×2): 500 mg via ORAL
  Filled 2019-01-12 (×2): qty 2

## 2019-01-12 NOTE — Care Management Important Message (Signed)
Important Message  Patient Details  IM Letter givne to [name] RN to present to the Patient   Name: Marilyn [name] MRN: [id number] Date of Birth: May 24, 1965   Medicare Important Message Given:  Yes     Kerin Salen 01/12/2019, 12:01 PM

## 2019-01-12 NOTE — Progress Notes (Signed)
PROGRESS NOTE    Marilyn [name]  DDU:[id number] DOB: [birth year] DOA: 01/01/2019 PCP: [name], FNP  Brief Narrative:  The patient is a 53 year old morbidly obese African-American female with a past medical history significant for metastatic adenocarcinoma, nonischemic cardiomyopathy, hypertension, hypothyroidism, history of malignant pleural effusion status post thoracentesis x3, hypothyroidism as well as other comorbidities who presented with a chief complaint of right-sided abdominal pain.  She reported the pain was going on for the last 3 months for last 3 days and acutely worsened.  She is currently on chemotherapy and her last infusion was on 12/27/2018 with Avastin, Alimta, carboplatin.  Initial CT and ultrasound noted gallbladder wall thickening, gallstones and HIDA scan was done which noted chronic cholecystitis.  Situation was complicated by neutropenia from chemotherapy.  General surgery evaluated and recommended she is a poor surgical candidate given her metastatic cancer recommended medical therapy as well as continue antibiotics for now and follow-up in outpatient setting.  They recommend advancing diet as tolerated and IR was unable to drain her gallbladder due to being contracted general surgery recommended symptomatic treatment course of antibiotics and follow-up as an outpatient to discuss elective cholecystectomy when she is completed chemotherapy as needed.  Hospitalization has been complicated by constipation and severe thrombocytopenia and now some hematuria which is worsening.  **Interim History on 01/10/2019 General surgery has signed off and PT OT not able to be done as patient was feeling a little bit dizzy and nauseous.  Hemoglobin hematocrit is trending down and will obtain a urinalysis and urine culture given her hematuria now.  Patient recently received subcu heparin which is now been stopped.  Will need to transfuse PRBCs given worsening hemoglobin.  Patient felt a  little bit better today so her diet was advanced to soft diet.  Platelet counts remain low still so she was transfused 1 more platelets.  Platelet count is improved.  Urinalysis showed 80,000 colonies of ESBL E. coli so antibiotics were changed from ciprofloxacin and are now IV meropenem.  Assessment & Plan:   Active Problems:   Diabetes mellitus without complication (HCC)   Adenocarcinoma of left lung, stage 3 (HCC)   Port catheter in place   Sinus tachycardia   Hypothyroidism   Non-ischemic cardiomyopathy (HCC)   Essential hypertension   HLD (hyperlipidemia)   Depression   Chronic diastolic CHF (congestive heart failure) (HCC)   Recurrent right pleural effusion   Metastatic adenocarcinoma to lung, right (HCC)   Cholecystitis, acute   Biliary colic   Chemotherapy-induced neutropenia (HCC)   Urinary tract infection due to extended-spectrum beta lactamase (ESBL) producing Escherichia coli  Acute on chronic cholecystitis, stable and slightly improved -Admitted with severe right upper quadrant abdominal pain, worsening from baseline Initial imaging studies with CT and ultrasound noted gallbladder wall thickening, gallstones, HIDA scan showed evidence of chronic cholecystitis   -Treated initially with bowel rest, IV Zosyn, complicated by neutropenia from recent chemotherapy -Repeat CT abdomen pelvis 10/1 without acute findings, gallbladder wall thickening appears to have improved,  -Pain appears to be reportedly a little better, continues to have some nausea today unable to eat or drink very much -Appreciate general surgery input, she is not a surgical candidate given metastatic cancer, recent Chemotherapy especially Avastin on 9/23 -Completed 6 days of empiric Zosyn, given worsening thrombocytopenia 5 days ago was changed to IV Cipro and Flagyl yesterday 10/5 and will change to po for a total of 14-day length of treatment however patient antibiotics will be escalated to  IV meropenem given  her ESBL E. coli UTI -Encouraged liquid diet as yesterday tolerated, wean IV narcotics; ADAT and now diet was advanced to a soft diet -LFTs are elevated in the setting of chronic cholecystitis and AST is 219 and ALT is 76; continue to monitor trend LFTs -Discussed with IR last week, PERC drain not an option since her gallbladder is contracted -Continue laxative, out of bed, Dulcolax suppository  -Ambulate and OOB  Malignant recurrent right pleural effusion -Patient has metastatic adenocarcinoma of the lung and has recurrent right malignant effusion -Pt is s/p thoracentesis 9/29, yielding 920cc fluid, cytology positive for malignant effusion -Follow-up intermittent x-rays as needed and repeat chest x-ray yesterday showed unchanged right suprahilar consolidation and atelectasis and right pleural effusion which was associated with atelectasis has decreased to 01/04/2019 no evidence of pneumothorax -CXR this AM showed "No significant change in AP portable examination with a spiculated perihilar mass of the right lung, elevation of the right hemidiaphragm, and small bilateral pleural effusions. No new airspace opacity."  Metastatic Non-small Cell Lung Cancer. -Followed by Dr. Irene Limbo -Currently on chemotherapy with carboplatin, Alimta and Avastin. Last cycle on 12/27/2018 -Oncology following and appreciate further evaluation and Recc's  Thrombocytopenia  -Due to sepsis, chemotherapy, worsened by recent heparin use; Now platelet Count is dropping again and 17,000 -Heparin discontinued 10/3 -Oncology following -HIT antibody panel is negative, SRA Negative  -My Partener Dr. Josephina Shih and discussed with patient's oncologist, Dr.Kale day before yesterday, he feels that lingering effects from recent chemotherapy could be contributing to her worsening thrombocytopenia as well and beta-lactam's, Zosyn potentially also worsening this, hence stopped IV PIP tazo, started Cipro Flagyl but escalated to IV  Meropenem -Checked immature platelet fraction is within normal range, -Transfused 2 unit of platelets during hospitalization; Has some Hematuria  -Monitor CBC  Macrocyctic Anemia/ABLA from Hematuria -Worsening Hgb/Hct and trended down to 7.2/21.6 yesterday -Type and Screen and Transfuse 2 units of pRBC's; Repeat CBC this AM showed Hgb/Hct to be 10.2/30.5 -In the setting of Hematuria and recent Chemo as well as sq Heparin use -Continue to Monitor for S/Sx of Bleeding; Having hematuria per nurse and will obtain a UA and Urine Cx and that showed infection -Repeat CBC in AM   Depression. -Continue on Cymbalta  Acute on chronic diastolic dysfunction. -Prior history of nonischemic cardiomyopathy showed normal EF -Given Lasix few days ago, monitor, dose periodically -Will give 2 units and IV 40 mg Lasix in between -Strict I's/O's and Daily Weights -She is +2222 mL since admission  -Continue to Monitor for S/Sx of Volume Overload -Repeat CXR as above   Hypokalemia -Patient's K+ was 3.6 -Continue to Monitor and Replete as necessary -Repeat CMP in AM  Hypophosphatemia -Patient's Phos Level was 2.7 -Continue to Monitor and Replete as Necessary -Repeat Phos Level in AM   Hypomagnesemia  -Patient's Mag Level this AM was 1.6 -Replete with IV Mag Sulfate 2 grams -Continue to Monitor and Replete as Necessary -Repeat Mag Level in AM  Hematuria in the Setting of ESBL E Coli UTI; not present on admission and likely an nosocomial acquired infection -Initial UA on 12/31/2026 was negative -Patient's UA showed clear appearance with 50 glucose, large hemoglobin, trace leukocytes, rare bacteria, 21-50 RBCs per high-power field, 11-20 WBCs, and urine culture showed 80,000 CFU of ESBL E Coli  -IV antibiotics were changed to IV meropenem given her ESBL UTI -Continue to monitor and trend and treat for at least 3 days total  Hyperbilirubinemia  -Patient's  T bili this morning was 1.8 -Likely  reactive in the setting of chronic cholecystitis -Continue to monitor and trend and repeat CMP in a.m.  Type 2 Diabetes Mellitus,poorlycontroled withoutcomplication -Last hemoglobin A1c was10. -Continue low-dose Lantus, CBGs are stable and ranging from 152-206  Obesity -Estimated body mass index is 39.3 kg/m as calculated from the following:   Height as of this encounter: 5\' 9"  (1.753 m).   Weight as of this encounter: 120.7 kg. -Weight Loss and Dietary Counseling given  -Nutritionist consulted and recommending Ensure Enlive BID, Boost Breeze BID, MVI with Minerals and encouraging po Intake   DVT prophylaxis: SCDs Code Status: FULL CODE  Family Communication: No family present at bedside  Disposition Plan: Pending Clinical Improvement back to baseline and tolerance of Diet and treatment of ESBL E. coli UTI.  Patient was transferred to medical floor yesterday and is doing well  Consultants:   General Surgery  Medical Oncology  IR   Procedures:  Thoracentesis 01/02/2019    Antimicrobials:  Anti-infectives (From admission, onward)   Start     Dose/Rate Route Frequency Ordered Stop   01/12/19 1800  meropenem (MERREM) 1 g in sodium chloride 0.9 % 100 mL IVPB     1 g 200 mL/hr over 30 Minutes Intravenous Every 8 hours 01/12/19 1709     01/12/19 1400  metroNIDAZOLE (FLAGYL) IVPB 500 mg  Status:  Discontinued     500 mg 100 mL/hr over 60 Minutes Intravenous Every 8 hours 01/12/19 0847 01/12/19 1709   01/12/19 1000  cefTRIAXone (ROCEPHIN) 2 g in sodium chloride 0.9 % 100 mL IVPB  Status:  Discontinued     2 g 200 mL/hr over 30 Minutes Intravenous Every 24 hours 01/12/19 0845 01/12/19 1709   01/12/19 0830  meropenem (MERREM) 1 g in sodium chloride 0.9 % 100 mL IVPB  Status:  Discontinued     1 g 200 mL/hr over 30 Minutes Intravenous Every 8 hours 01/12/19 0823 01/12/19 0825   01/08/19 1400  ciprofloxacin (CIPRO) IVPB 400 mg  Status:  Discontinued     400 mg 200 mL/hr over  60 Minutes Intravenous Every 12 hours 01/08/19 1311 01/12/19 0823   01/08/19 1400  metroNIDAZOLE (FLAGYL) IVPB 500 mg  Status:  Discontinued     500 mg 100 mL/hr over 60 Minutes Intravenous Every 8 hours 01/08/19 1311 01/12/19 0823   01/06/19 1215  ciprofloxacin (CIPRO) tablet 500 mg  Status:  Discontinued     500 mg Oral 2 times daily 01/06/19 1200 01/06/19 1201   01/01/19 2200  piperacillin-tazobactam (ZOSYN) IVPB 3.375 g  Status:  Discontinued     3.375 g 12.5 mL/hr over 240 Minutes Intravenous Every 8 hours 01/01/19 1450 01/08/19 1311   01/01/19 1500  piperacillin-tazobactam (ZOSYN) IVPB 3.375 g     3.375 g 100 mL/hr over 30 Minutes Intravenous  Once 01/01/19 1450 01/01/19 2045     Subjective: Seen and examined at bedside.  States that she was trying to keep her eggs down.  No nausea or vomiting but states that she still having right-sided abdominal pain.  States it is tolerable currently.  No chest pain, lightheadedness or dizziness.  Thinks her lower abdominal pain is improving slightly.  No other concerns or complaints at this time and surprised to find out that she had a ESBL E. coli UTI and did not realize that the blood that she is having in her urine is likely secondary to the infection.  Objective: Vitals:   01/11/19  2049 01/11/19 2112 01/12/19 0444 01/12/19 1319  BP: (!) 152/82 (!) 149/98 (!) 161/99 (!) 143/98  Pulse: (!) 111 (!) 110 (!) 112 (!) 110  Resp: (!) 22  (!) 22 18  Temp: 98.5 F (36.9 C) 98 F (36.7 C) 98.6 F (37 C) 98.5 F (36.9 C)  TempSrc: Oral Oral Oral Oral  SpO2: 95% 96% 97% 97%  Weight:      Height:        Intake/Output Summary (Last 24 hours) at 01/12/2019 1755 Last data filed at 01/12/2019 0600 Gross per 24 hour  Intake 1156.33 ml  Output -  Net 1156.33 ml   Filed Weights   01/01/19 0416 01/04/19 1330 01/11/19 0515  Weight: 119.7 kg 119.9 kg 120.7 kg   Examination: Physical Exam:  Constitutional: WN/WD obese AAF in NAD and appears calm  Eyes: Lids and conjunctivae normal, sclerae anicteric  ENMT: External Ears, Nose appear normal. Grossly normal hearing. Mucous membranes are moist.  Neck: Appears normal, supple, no cervical masses, normal ROM, no appreciable thyromegaly; no JVD Respiratory: Diminished to auscultation bilaterally, no wheezing, rales, rhonchi or crackles. Normal respiratory effort and patient is not tachypenic. No appreciable accessory muscle use and unlabored breathing.  Cardiovascular: RRR, no murmurs / rubs / gallops. S1 and S2 auscultated. Trace extremity edema.  Abdomen: Soft, non-tender, non-distended. Bowel sounds positive x4.  GU: Deferred. Musculoskeletal: No clubbing / cyanosis of digits/nails. No joint deformity upper and lower extremities.  Skin: No rashes, lesions, ulcers on a limited skin evaluation. No induration; Warm and dry.  Neurologic: CN 2-12 grossly intact with no focal deficits. Romberg sign and cerebellar reflexes not assessed.  Psychiatric: Normal judgment and insight. Alert and oriented x 3. Mildly anxious mood and appropriate affect.   Data Reviewed: I have personally reviewed following labs and imaging studies  CBC: Recent Labs  Lab 01/08/19 0501 01/09/19 0348 01/09/19 1547 01/10/19 0415 01/10/19 1500 01/11/19 0608 01/12/19 0434  WBC 5.8 8.0 10.3 8.0  --  8.5 6.6  NEUTROABS 5.2  --   --   --   --  7.1 5.3  HGB 8.3* 7.8* 8.3* 7.2* 7.4* 10.8* 10.2*  HCT 25.3* 23.5* 24.8* 21.6* 23.2* 31.4* 30.5*  MCV 101.6* 101.3* 101.2* 101.9*  --  95.4 95.6  PLT 14* 7* 45* 30*  --  17* 37*   Basic Metabolic Panel: Recent Labs  Lab 01/08/19 0501 01/09/19 0348 01/10/19 0415 01/11/19 0608 01/12/19 0434  NA 143 142 141 140 136  K 3.2* 3.0* 2.9* 3.4* 3.6  CL 106 106 104 102 101  CO2 26 27 27 26 25   GLUCOSE 147* 214* 181* 147* 195*  BUN 21* 23* 15 9 9   CREATININE 0.96 0.96 0.84 0.76 0.77  CALCIUM 8.0* 7.8* 7.8* 8.1* 7.8*  MG  --   --  1.7 1.5* 1.6*  PHOS  --   --  1.1* 2.0* 2.7    GFR: Estimated Creatinine Clearance: 115.6 mL/min (by C-G formula based on SCr of 0.77 mg/dL). Liver Function Tests: Recent Labs  Lab 01/06/19 0430 01/11/19 0608 01/12/19 0434  AST 35 217* 219*  ALT 14 62* 76*  ALKPHOS 157* 192* 190*  BILITOT 0.9 1.8* 0.9  PROT 7.7 6.7 6.3*  ALBUMIN 2.6* 2.6* 2.6*   No results for input(s): LIPASE, AMYLASE in the last 168 hours. No results for input(s): AMMONIA in the last 168 hours. Coagulation Profile: No results for input(s): INR, PROTIME in the last 168 hours. Cardiac Enzymes: No results for  input(s): CKTOTAL, CKMB, CKMBINDEX, TROPONINI in the last 168 hours. BNP (last 3 results) No results for input(s): PROBNP in the last 8760 hours. HbA1C: No results for input(s): HGBA1C in the last 72 hours. CBG: Recent Labs  Lab 01/12/19 0053 01/12/19 0437 01/12/19 0739 01/12/19 1144 01/12/19 1645  GLUCAP 195* 187* 157* 206* 152*   Lipid Profile: No results for input(s): CHOL, HDL, LDLCALC, TRIG, CHOLHDL, LDLDIRECT in the last 72 hours. Thyroid Function Tests: No results for input(s): TSH, T4TOTAL, FREET4, T3FREE, THYROIDAB in the last 72 hours. Anemia Panel: No results for input(s): VITAMINB12, FOLATE, FERRITIN, TIBC, IRON, RETICCTPCT in the last 72 hours. Sepsis Labs: No results for input(s): PROCALCITON, LATICACIDVEN in the last 168 hours.  Recent Results (from the past 240 hour(s))  MRSA PCR Screening     Status: Abnormal   Collection Time: 01/04/19  1:39 PM   Specimen: Nasal Mucosa; Nasopharyngeal  Result Value Ref Range Status   MRSA by PCR POSITIVE (A) NEGATIVE Final    Comment:        The GeneXpert MRSA Assay (FDA approved for NASAL specimens only), is one component of a comprehensive MRSA colonization surveillance program. It is not intended to diagnose MRSA infection nor to guide or monitor treatment for MRSA infections. RESULT CALLED TO, READ BACK BY AND VERIFIED WITH: Lorna Dibble [id number] @ [address]  Performed at [facility] [address], [place], [place] [address]   Culture, Urine     Status: Abnormal   Collection Time: 01/10/19  6:00 PM   Specimen: Urine, Random  Result Value Ref Range Status   Specimen Description   Final    URINE, RANDOM Performed at [facility] [address], [place], [place] [address]    Special Requests   Final    NONE Performed at [facility], [place] [address], [place], [place] [address]    Culture (A)  Final    80,000 COLONIES/mL ESCHERICHIA COLI Confirmed Extended Spectrum Beta-Lactamase Producer (ESBL).  In bloodstream infections from ESBL organisms, carbapenems are preferred over piperacillin/tazobactam. They are shown to have a lower risk of mortality.    Report Status 01/12/2019 FINAL  Final   Organism ID, Bacteria ESCHERICHIA COLI (A)  Final      Susceptibility   Escherichia coli - MIC*    AMPICILLIN >=32 RESISTANT Resistant     CEFAZOLIN >=64 RESISTANT Resistant     CEFTRIAXONE >=64 RESISTANT Resistant     CIPROFLOXACIN >=4 RESISTANT Resistant     GENTAMICIN <=1 SENSITIVE Sensitive     IMIPENEM <=0.25 SENSITIVE Sensitive     NITROFURANTOIN <=16 SENSITIVE Sensitive     TRIMETH/SULFA >=320 RESISTANT Resistant     AMPICILLIN/SULBACTAM 8 SENSITIVE Sensitive     PIP/TAZO <=4 SENSITIVE Sensitive     Extended ESBL POSITIVE Resistant     * 80,000 COLONIES/mL ESCHERICHIA COLI     RN Pressure Injury Documentation: Pressure Injury 11/16/18 Buttocks Right;Medial Stage II -  Partial thickness loss of dermis presenting as a shallow open ulcer with a red, pink wound bed without slough. right inner buttock (Active)  11/16/18 1815  Location: Buttocks  Location Orientation: Right;Medial  Staging: Stage II -  Partial thickness loss of dermis presenting as a shallow open ulcer with a red, pink wound bed without slough.  Wound Description (Comments): right inner buttock  Present on Admission:  Yes     Estimated body mass index is 39.3 kg/m as calculated from the  following:   Height as of this encounter: 5\' 9"  (1.753 m).   Weight as of this encounter: 120.7 kg.  Malnutrition Type:  Nutrition Problem: Increased nutrient needs Etiology: chronic illness, cancer and cancer related treatments   Malnutrition Characteristics:  Signs/Symptoms: estimated needs   Nutrition Interventions:  Interventions: Boost Breeze, Ensure Enlive (each supplement provides 350kcal and 20 grams of protein), MVI    Radiology Studies: Dg Chest Port 1 View  Result Date: 01/12/2019 CLINICAL DATA:  Shortness of breath EXAM: PORTABLE CHEST 1 VIEW COMPARISON:  01/10/2019 FINDINGS: No significant change in AP portable examination with a spiculated perihilar mass of the right lung, elevation of the right hemidiaphragm, and small bilateral pleural effusions. No new airspace opacity. Right chest port catheter. Heart is unremarkable. IMPRESSION: No significant change in AP portable examination with a spiculated perihilar mass of the right lung, elevation of the right hemidiaphragm, and small bilateral pleural effusions. No new airspace opacity. Electronically Signed   By: Eddie Candle M.D.   On: 01/12/2019 12:41     Scheduled Meds: . atorvastatin  40 mg Oral q1800  . Chlorhexidine Gluconate Cloth  6 each Topical Daily  . docusate sodium  100 mg Oral BID  . DULoxetine  60 mg Oral Daily  . feeding supplement  1 Container Oral BID BM  . feeding supplement (ENSURE ENLIVE)  237 mL Oral BID BM  . folic acid  1 mg Oral Daily  . insulin aspart  0-9 Units Subcutaneous Q4H  . insulin glargine  20 Units Subcutaneous QAC breakfast  . levothyroxine  150 mcg Oral QAC breakfast  . mouth rinse  15 mL Mouth Rinse BID  . metoprolol succinate  25 mg Oral QPM  . metoprolol succinate  50 mg Oral Daily  . multivitamin with minerals  1 tablet Oral Daily  . pantoprazole  40 mg Oral Daily  . phosphorus  500 mg Oral BID  .  polyethylene glycol  17 g Oral BID  . senna-docusate  2 tablet Oral BID  . vitamin B-12  1,000 mcg Oral Daily   Continuous Infusions: . sodium chloride    . sodium chloride Stopped (01/07/19 0455)  . meropenem (MERREM) IV      LOS: 10 days    Kerney Elbe, DO [facility] PAGER is on [place]  If 7PM-7AM, please contact night-coverage [email or web address] Password [email or web address] 01/12/2019, 5:55 PM

## 2019-01-12 NOTE — Progress Notes (Signed)
Occupational Therapy Treatment Patient Details Name: Marilyn [name] MRN: [id number] DOB: 1965/11/08 Today's Date: 01/12/2019    History of present illness 53 year old female admitted for recurrent R pleural effusinon, r/o cholisthiasis  PMH:  metastatic lung CA, recurrent pleural effusions, chronic cholecystitis, DM., OA, CHF and asthma   OT comments  Pt. Seen for skilled OT treatment session.  Focus of session to increase attention to tasks and activity tolerance to aide in increasing endurance. Pt. Stood approx. 2 min. For task completion and initiated rest break appropriately.    Follow Up Recommendations  SNF    Equipment Recommendations  3 in 1 bedside commode    Recommendations for Other Services      Precautions / Restrictions Precautions Precautions: Fall Precaution Comments: monitor HR and O2 Restrictions Weight Bearing Restrictions: No       Mobility Bed Mobility Overal bed mobility: Needs Assistance Bed Mobility: Supine to Sit     Supine to sit: Supervision;HOB elevated     General bed mobility comments: seated in chair at beginning and end of session  Transfers Overall transfer level: Needs assistance Equipment used: Rolling walker (2 wheeled) Transfers: Sit to/from Stand Sit to Stand: Min assist         General transfer comment: Assist to rise, stabilize, control descent. VCs safety, hand placement.    Balance Overall balance assessment: Needs assistance         Standing balance support: Bilateral upper extremity supported Standing balance-Leahy Scale: Poor                             ADL either performed or assessed with clinical judgement   ADL Overall ADL's : Needs assistance/impaired                                       General ADL Comments: had pt. stand at tray table and clear off  un wanted items and throw away.  focus was on activity tolerance, standing balance, and pts ability to notify when a rest  break is needed.  intially difficult to direct to task. once standing was able to reach various directions and clear the items off of the tray and throw in trash can that was located beside her.  turning to L/R no lob noted.  did request a rest break after approx. 2 min. reached back for arm rests with cueing for safety.  reports when she feels SOB at home she does initiate rest breaks as needed.     Vision       Perception     Praxis      Cognition Arousal/Alertness: Awake/alert Behavior During Therapy: WFL for tasks assessed/performed Overall Cognitive Status: Within Functional Limits for tasks assessed                                          Exercises     Shoulder Instructions       General Comments      Pertinent Vitals/ Pain       Pain Assessment: Faces Faces Pain Scale: Hurts a little bit Pain Location: R hand Pain Descriptors / Indicators: [place]  Prior Functioning/Environment              Frequency  Min 2X/week        Progress Toward Goals  OT Goals(current goals can now be found in the care plan section)  Progress towards OT goals: Progressing toward goals     Plan      Co-evaluation                 AM-PAC OT "6 Clicks" Daily Activity     Outcome Measure   Help from another person eating meals?: A Little Help from another person taking care of personal grooming?: A Little Help from another person toileting, which includes using toliet, bedpan, or urinal?: A Lot Help from another person bathing (including washing, rinsing, drying)?: A Lot Help from another person to put on and taking off regular upper body clothing?: A Lot Help from another person to put on and taking off regular lower body clothing?: Total 6 Click Score: 13    End of Session Equipment Utilized During Treatment: Gait belt;Rolling walker  OT Visit Diagnosis: Muscle weakness  (generalized) (M62.81)   Activity Tolerance     Patient Left in chair;with call bell/phone within reach   Nurse Communication          Time: 1117-1130 OT Time Calculation (min): 13 min  Charges: OT General Charges $OT Visit: 1 Visit OT Treatments $Self Care/Home Management : 8-22 mins   Janice Coffin, COTA/L 01/12/2019, 1:09 PM

## 2019-01-12 NOTE — Progress Notes (Signed)
Physical Therapy Treatment Patient Details Name: Marilyn [name] MRN: [id number] DOB: 07-19-1965 Today's Date: 01/12/2019    History of Present Illness 53 year old female admitted for recurrent R pleural effusinon, r/o cholisthiasis  PMH:  metastatic lung CA, recurrent pleural effusions, chronic cholecystitis, DM., OA, CHF and asthma    PT Comments    Progressing slowly with mobility. Pt is unsteady and at risk for falls when mobilizing. LOB while ambulating, requiring assistance from therapist and tech to prevent fall. HR up to 120s and O2 90% on RA with activity. Do not feel pt is safe to d/c home unless she will have 24 hour supervision/assist. Pt stated she has aides that can assist but I do not think they are there all the time. Will continue to follow and progress activity as tolerated.     Follow Up Recommendations  Home health PT;Supervision/Assistance - 24 hour vs SNF(depending on progress and assistance available at home)     Equipment Recommendations  None recommended by PT    Recommendations for Other Services       Precautions / Restrictions Precautions Precautions: Fall Precaution Comments: monitor HR and O2 Restrictions Weight Bearing Restrictions: No    Mobility  Bed Mobility Overal bed mobility: Needs Assistance Bed Mobility: Supine to Sit     Supine to sit: Supervision;HOB elevated     General bed mobility comments: for safety, lines  Transfers Overall transfer level: Needs assistance Equipment used: Rolling walker (2 wheeled) Transfers: Sit to/from Stand Sit to Stand: Min assist         General transfer comment: Assist to rise, stabilize, control descent. VCs safety, hand placement.  Ambulation/Gait Ambulation/Gait assistance: Min assist;+2 safety/equipment Gait Distance (Feet): 15 Feet Assistive device: Rolling walker (2 wheeled) Gait Pattern/deviations: Decreased stride length;Step-through pattern     General Gait Details: LOB x 1  requiring assist from therapist and tech to prevent fall. Very unsteady. HR up to 120s and O2 90% on RA. Remained in room-pt requested door remain closed because she didn't want any people to see her.    Stairs             Wheelchair Mobility    Modified Rankin (Stroke Patients Only)       Balance Overall balance assessment: Needs assistance         Standing balance support: Bilateral upper extremity supported Standing balance-Leahy Scale: Poor                              Cognition Arousal/Alertness: Awake/alert Behavior During Therapy: WFL for tasks assessed/performed Overall Cognitive Status: Within Functional Limits for tasks assessed                                        Exercises      General Comments        Pertinent Vitals/Pain Pain Assessment: No/denies pain    Home Living                      Prior Function            PT Goals (current goals can now be found in the care plan section) Progress towards PT goals: Progressing toward goals    Frequency    Min 3X/week      PT Plan Current plan remains appropriate  Co-evaluation              AM-PAC PT "6 Clicks" Mobility   Outcome Measure  Help needed turning from your back to your side while in a flat bed without using bedrails?: A Little Help needed moving from lying on your back to sitting on the side of a flat bed without using bedrails?: A Little Help needed moving to and from a bed to a chair (including a wheelchair)?: A Little Help needed standing up from a chair using your arms (e.g., wheelchair or bedside chair)?: A Little Help needed to walk in hospital room?: A Lot Help needed climbing 3-5 steps with a railing? : A Lot 6 Click Score: 16    End of Session Equipment Utilized During Treatment: Gait belt Activity Tolerance: Patient tolerated treatment well Patient left: in chair;with call bell/phone within reach;with chair alarm  set   PT Visit Diagnosis: Unsteadiness on feet (R26.81);Muscle weakness (generalized) (M62.81)     Time: 1043-1100 PT Time Calculation (min) (ACUTE ONLY): 17 min  Charges:  $Gait Training: 8-22 mins                        Weston Anna, PT Acute Rehabilitation Services Pager: [phone] Office: [phone]

## 2019-01-13 ENCOUNTER — Inpatient Hospital Stay (HOSPITAL_COMMUNITY): Payer: Medicare Other

## 2019-01-13 DIAGNOSIS — Z95828 Presence of other vascular implants and grafts: Secondary | ICD-10-CM

## 2019-01-13 DIAGNOSIS — R319 Hematuria, unspecified: Secondary | ICD-10-CM

## 2019-01-13 LAB — CBC WITH DIFFERENTIAL/PLATELET
Abs Immature Granulocytes: 0.08 10*3/uL — ABNORMAL HIGH (ref 0.00–0.07)
Basophils Absolute: 0 10*3/uL (ref 0.0–0.1)
Basophils Relative: 0 %
Eosinophils Absolute: 0 10*3/uL (ref 0.0–0.5)
Eosinophils Relative: 0 %
HCT: 31.9 % — ABNORMAL LOW (ref 36.0–46.0)
Hemoglobin: 10.6 g/dL — ABNORMAL LOW (ref 12.0–15.0)
Immature Granulocytes: 1 %
Lymphocytes Relative: 11 %
Lymphs Abs: 0.7 10*3/uL (ref 0.7–4.0)
MCH: 32.7 pg (ref 26.0–34.0)
MCHC: 33.2 g/dL (ref 30.0–36.0)
MCV: 98.5 fL (ref 80.0–100.0)
Monocytes Absolute: 0.8 10*3/uL (ref 0.1–1.0)
Monocytes Relative: 13 %
Neutro Abs: 4.8 10*3/uL (ref 1.7–7.7)
Neutrophils Relative %: 75 %
Platelets: 23 10*3/uL — CL (ref 150–400)
RBC: 3.24 MIL/uL — ABNORMAL LOW (ref 3.87–5.11)
RDW: 17.7 % — ABNORMAL HIGH (ref 11.5–15.5)
WBC: 6.4 10*3/uL (ref 4.0–10.5)
nRBC: 0 % (ref 0.0–0.2)

## 2019-01-13 LAB — MAGNESIUM: Magnesium: 1.7 mg/dL (ref 1.7–2.4)

## 2019-01-13 LAB — COMPREHENSIVE METABOLIC PANEL
ALT: 67 U/L — ABNORMAL HIGH (ref 0–44)
AST: 148 U/L — ABNORMAL HIGH (ref 15–41)
Albumin: 2.4 g/dL — ABNORMAL LOW (ref 3.5–5.0)
Alkaline Phosphatase: 236 U/L — ABNORMAL HIGH (ref 38–126)
Anion gap: 11 (ref 5–15)
BUN: 9 mg/dL (ref 6–20)
CO2: 24 mmol/L (ref 22–32)
Calcium: 7.9 mg/dL — ABNORMAL LOW (ref 8.9–10.3)
Chloride: 104 mmol/L (ref 98–111)
Creatinine, Ser: 0.72 mg/dL (ref 0.44–1.00)
GFR calc Af Amer: >60 mL/min (ref >60)
GFR calc non Af Amer: >60 mL/min (ref >60)
Glucose, Bld: 152 mg/dL — ABNORMAL HIGH (ref 70–99)
Potassium: 3.6 mmol/L (ref 3.5–5.1)
Sodium: 139 mmol/L (ref 135–145)
Total Bilirubin: 1 mg/dL (ref 0.3–1.2)
Total Protein: 6.6 g/dL (ref 6.5–8.1)

## 2019-01-13 LAB — GLUCOSE, CAPILLARY
Glucose-Capillary: 133 mg/dL — ABNORMAL HIGH (ref 70–99)
Glucose-Capillary: 134 mg/dL — ABNORMAL HIGH (ref 70–99)
Glucose-Capillary: 156 mg/dL — ABNORMAL HIGH (ref 70–99)
Glucose-Capillary: 158 mg/dL — ABNORMAL HIGH (ref 70–99)
Glucose-Capillary: 176 mg/dL — ABNORMAL HIGH (ref 70–99)
Glucose-Capillary: 181 mg/dL — ABNORMAL HIGH (ref 70–99)

## 2019-01-13 LAB — PHOSPHORUS: Phosphorus: 6.2 mg/dL — ABNORMAL HIGH (ref 2.5–4.6)

## 2019-01-13 MED ORDER — ALTEPLASE 2 MG IJ SOLR
2.0000 mg | Freq: Once | INTRAMUSCULAR | Status: AC
  Administered 2019-01-13: 2 mg
  Filled 2019-01-13: qty 2

## 2019-01-13 MED ORDER — SODIUM CHLORIDE 0.9% IV SOLUTION: Freq: Once | INTRAVENOUS | Status: AC

## 2019-01-13 NOTE — Progress Notes (Signed)
PROGRESS NOTE    Marilyn [name]  YPP:[id number] DOB: 1965/09/18 DOA: 01/01/2019 PCP: [name], FNP  Brief Narrative:  The patient is a 53 year old morbidly obese African-American female with a past medical history significant for metastatic adenocarcinoma, nonischemic cardiomyopathy, hypertension, hypothyroidism, history of malignant pleural effusion status post thoracentesis x3, hypothyroidism as well as other comorbidities who presented with a chief complaint of right-sided abdominal pain.  She reported the pain was going on for the last 3 months for last 3 days and acutely worsened.  She is currently on chemotherapy and her last infusion was on 12/27/2018 with Avastin, Alimta, carboplatin.  Initial CT and ultrasound noted gallbladder wall thickening, gallstones and HIDA scan was done which noted chronic cholecystitis.  Situation was complicated by neutropenia from chemotherapy.  General surgery evaluated and recommended she is a poor surgical candidate given her metastatic cancer recommended medical therapy as well as continue antibiotics for now and follow-up in outpatient setting.  They recommend advancing diet as tolerated and IR was unable to drain her gallbladder due to being contracted general surgery recommended symptomatic treatment course of antibiotics and follow-up as an outpatient to discuss elective cholecystectomy when she is completed chemotherapy as needed.  Hospitalization has been complicated by constipation and severe thrombocytopenia and now some hematuria which is worsening.  **Interim History on 01/10/2019 General surgery has signed off and PT OT not able to be done as patient was feeling a little bit dizzy and nauseous and has been refusing.  Hemoglobin hematocrit is trending down and will obtain a urinalysis and urine culture given her hematuria now and showed and ESBL E Coli  Patient recently received subcu heparin which is now been stopped.  She was transfused PRBCs  for low platelets due to her worsening hemoglobin and platelet count patient felt a little bit better today so her diet was advanced to soft diet.  Platelet count is improved but continues to trend well.  Urinalysis showed 80,000 colonies of ESBL E. coli so antibiotics were changed from ciprofloxacin and are now IV meropenem.  OPAT orders were initiated to see if the patient can get IV ertapenem in outpatient setting awaiting case management input.  If patient cannot get ertapenem she will continue to stay in the hospital and get treatment for a 5-day course of antibiotic coverage.  Assessment & Plan:   Active Problems:   Diabetes mellitus without complication (HCC)   Adenocarcinoma of left lung, stage 3 (HCC)   Port catheter in place   Sinus tachycardia   Hypothyroidism   Non-ischemic cardiomyopathy (HCC)   Essential hypertension   HLD (hyperlipidemia)   Depression   Chronic diastolic CHF (congestive heart failure) (HCC)   Recurrent right pleural effusion   Metastatic adenocarcinoma to lung, right (HCC)   Cholecystitis, acute   Biliary colic   Chemotherapy-induced neutropenia (HCC)   Urinary tract infection due to extended-spectrum beta lactamase (ESBL) producing Escherichia coli  Acute on chronic cholecystitis, stable and slightly improved -Admitted with severe right upper quadrant abdominal pain, worsening from baseline Initial imaging studies with CT and ultrasound noted gallbladder wall thickening, gallstones, HIDA scan showed evidence of chronic cholecystitis   -Treated initially with bowel rest, IV Zosyn, complicated by neutropenia from recent chemotherapy -Repeat CT abdomen pelvis 10/1 without acute findings, gallbladder wall thickening appears to have improved,  -Pain appears to be reportedly a little better, continues to have some nausea today unable to eat or drink very much -Appreciate general surgery input, she is not  a surgical candidate given metastatic cancer, recent  Chemotherapy especially Avastin on 9/23 -Completed 6 days of empiric Zosyn, given worsening thrombocytopenia 5 days ago was changed to IV Cipro and Flagyl yesterday 10/5 and will change to po for a total of 14-day length of treatment however patient antibiotics will be escalated to IV meropenem given her ESBL E. coli UTI -Encouraged liquid diet as yesterday tolerated, wean IV narcotics; ADAT and now diet was advanced to a soft diet -LFTs are elevated in the setting of chronic cholecystitis and AST is 219 and ALT is 76; continue to monitor trend LFTs -Discussed with IR last week, PERC drain not an option since her gallbladder is contracted -Continue laxative, out of bed, Dulcolax suppository  -Ambulate and OOB -PT OT to evaluate and treat and they are recommending home health PT versus SNF but patient wants to go home  Malignant recurrent right pleural effusion -Patient has metastatic adenocarcinoma of the lung and has recurrent right malignant effusion -Pt is s/p thoracentesis 9/29, yielding 920cc fluid, cytology positive for malignant effusion -Follow-up intermittent x-rays as needed and repeat chest x-ray yesterday showed unchanged right suprahilar consolidation and atelectasis and right pleural effusion which was associated with atelectasis has decreased to 01/04/2019 no evidence of pneumothorax -CXR yesterday AM showed "No significant change in AP portable examination with a spiculated perihilar mass of the right lung, elevation of the right hemidiaphragm, and small bilateral pleural effusions. No new airspace opacity."  Metastatic Non-small Cell Lung Cancer. -Followed by Dr. Irene Limbo -Currently on chemotherapy with carboplatin, Alimta and Avastin. Last cycle on 12/27/2018 -Oncology following and appreciate further evaluation and Recc's  Thrombocytopenia  -Due to sepsis, chemotherapy, worsened by recent heparin use; Now platelet Count is dropping again and 23,000 from yesterday  -Heparin  discontinued 10/3 -Oncology following -HIT antibody panel is negative, SRA Negative  -My Partener Dr. Josephina Shih and discussed with patient's oncologist, Dr.Kale day before yesterday, he feels that lingering effects from recent chemotherapy could be contributing to her worsening thrombocytopenia as well and beta-lactam's, Zosyn potentially also worsening this, hence stopped IV PIP tazo, started Cipro Flagyl but escalated to IV Meropenem -Checked immature platelet fraction is within normal range, -Transfused 2 unit of platelets during hospitalization; Has some Hematuria from UTI and Blood is still red -Monitor CBC  Macrocyctic Anemia/ABLA from Hematuria -Worsening Hgb/Hct and trended down to 7.2/21.6 yesterday -Type and Screen and Transfuse 2 units of pRBC's; Repeat CBC this AM showed Hgb/Hct to be 10.2/30.5 -In the setting of Hematuria and recent Chemo as well as sq Heparin use -Continue to Monitor for S/Sx of Bleeding; Having hematuria per nurse and will obtain a UA and Urine Cx and that showed infection -May need to discuss with Urology if not improving  -Repeat CBC in AM   Depression. -Continue on Cymbalta  Acute on chronic diastolic dysfunction. -Prior history of nonischemic cardiomyopathy showed normal EF -Given Lasix few days ago, monitor, dose periodically -Will give 2 units and IV 40 mg Lasix in between -Strict I's/O's and Daily Weights -She is +2222 mL since admission  -Continue to Monitor for S/Sx of Volume Overload -Repeat CXR as above   Hypokalemia -Patient's K+ was 3.6 -Continue to Monitor and Replete as necessary -Repeat CMP in AM  Hypophosphatemia -> Hyperphosphatemia  -Patient's Phos Level was 6.2 -Continue to Monitor and Replete as Necessary -Repeat Phos Level in AM   Hypomagnesemia  -Patient's Mag Level this AM was 1.7 -Continue to Monitor and Replete as Necessary -Repeat Mag  Level in AM  Hematuria in the Setting of ESBL E Coli UTI; not present on  admission and likely an nosocomial acquired infection -Initial UA on 12/31/2026 was negative -Patient's UA showed clear appearance with 50 glucose, large hemoglobin, trace leukocytes, rare bacteria, 21-50 RBCs per high-power field, 11-20 WBCs, and urine culture showed 80,000 CFU of ESBL E Coli  -IV antibiotics were changed to IV meropenem given her ESBL UTI -Continue to monitor and trend and treat for at least 5 days total -Had some hematuria in the urine canister from her Purewick; if not improving will need to discuss with urology  Hyperbilirubinemia  -Patient's T bili this morning was 1.8 and trended down to 1.0 -Likely reactive in the setting of chronic cholecystitis -Continue to monitor and trend and repeat CMP in a.m.  Type 2 Diabetes Mellitus,poorlycontroled withoutcomplication -Last hemoglobin A1c was10. -Continue low-dose Lantus, CBGs are stable and ranging from 152-206  Obesity -Estimated body mass index is 39.3 kg/m as calculated from the following:   Height as of this encounter: 5\' 9"  (1.753 m).   Weight as of this encounter: 120.7 kg. -Weight Loss and Dietary Counseling given  -Nutritionist consulted and recommending Ensure Enlive BID, Boost Breeze BID, MVI with Minerals and encouraging po Intake   DVT prophylaxis: SCDs Code Status: FULL CODE  Family Communication: No family present at bedside  Disposition Plan: Pending Clinical Improvement back to baseline and tolerance of Diet and treatment of ESBL E. coli UTI.  Patient was transferred to medical floor yesterday and is doing well; I discussed with her about obtaining IV ertapenem for possible discharge and opiate orders have been initiated and awaiting case management response  Consultants:   General Surgery  Medical Oncology  IR   Procedures:  Thoracentesis 01/02/2019    Antimicrobials:  Anti-infectives (From admission, onward)   Start     Dose/Rate Route Frequency Ordered Stop   01/12/19 1800   meropenem (MERREM) 1 g in sodium chloride 0.9 % 100 mL IVPB     1 g 200 mL/hr over 30 Minutes Intravenous Every 8 hours 01/12/19 1709     01/12/19 1400  metroNIDAZOLE (FLAGYL) IVPB 500 mg  Status:  Discontinued     500 mg 100 mL/hr over 60 Minutes Intravenous Every 8 hours 01/12/19 0847 01/12/19 1709   01/12/19 1000  cefTRIAXone (ROCEPHIN) 2 g in sodium chloride 0.9 % 100 mL IVPB  Status:  Discontinued     2 g 200 mL/hr over 30 Minutes Intravenous Every 24 hours 01/12/19 0845 01/12/19 1709   01/12/19 0830  meropenem (MERREM) 1 g in sodium chloride 0.9 % 100 mL IVPB  Status:  Discontinued     1 g 200 mL/hr over 30 Minutes Intravenous Every 8 hours 01/12/19 0823 01/12/19 0825   01/08/19 1400  ciprofloxacin (CIPRO) IVPB 400 mg  Status:  Discontinued     400 mg 200 mL/hr over 60 Minutes Intravenous Every 12 hours 01/08/19 1311 01/12/19 0823   01/08/19 1400  metroNIDAZOLE (FLAGYL) IVPB 500 mg  Status:  Discontinued     500 mg 100 mL/hr over 60 Minutes Intravenous Every 8 hours 01/08/19 1311 01/12/19 0823   01/06/19 1215  ciprofloxacin (CIPRO) tablet 500 mg  Status:  Discontinued     500 mg Oral 2 times daily 01/06/19 1200 01/06/19 1201   01/01/19 2200  piperacillin-tazobactam (ZOSYN) IVPB 3.375 g  Status:  Discontinued     3.375 g 12.5 mL/hr over 240 Minutes Intravenous Every 8 hours 01/01/19 1450  01/08/19 1311   01/01/19 1500  piperacillin-tazobactam (ZOSYN) IVPB 3.375 g     3.375 g 100 mL/hr over 30 Minutes Intravenous  Once 01/01/19 1450 01/01/19 2045     Subjective: Seen and examined at bedside and was little frustrated and wanting to go home.  I discussed with her about being treated for urinary tract infection and she understands.  She denies any chest pain, lightheadedness or dizziness but still continues to have some mild hematuria with pinkish color in her [place] but she is draining Urine well. No other concerns or complaints at this time.   Objective: Vitals:    01/13/19 0600 01/13/19 0854 01/13/19 1331 01/13/19 1626  BP:  (!) 149/95 (!) 156/104 (!) 167/112  Pulse: (!) 112 (!) 117 (!) 116 (!) 120  Resp: 17 (!) 22 (!) 24 18  Temp:  (!) 97.4 F (36.3 C) 98.6 F (37 C) 98.4 F (36.9 C)  TempSrc:  Oral Oral Oral  SpO2:  96% 96% 98%  Weight:      Height:        Intake/Output Summary (Last 24 hours) at 01/13/2019 2034 Last data filed at 01/13/2019 1600 Gross per 24 hour  Intake 620.28 ml  Output 1300 ml  Net -679.72 ml   Filed Weights   01/01/19 0416 01/04/19 1330 01/11/19 0515  Weight: 119.7 kg 119.9 kg 120.7 kg   Examination: Physical Exam:  Constitutional: WN/WD obese AAF in mild distress and appears Anxious and a little uncomfortable Eyes: Lids and conjunctivae normal, sclerae anicteric  ENMT: External Ears, Nose appear normal. Grossly normal hearing. Mucous membranes are moist.  Neck: Appears normal, supple, no cervical masses, normal ROM, no appreciable thyromegaly; no JVD Respiratory: Diminished to auscultation bilaterally, no wheezing, rales, rhonchi or crackles. Normal respiratory effort and patient is not tachypenic. No accessory muscle use.  Cardiovascular: Tachycardic Rate, no murmurs / rubs / gallops. S1 and S2 auscultated. Trace extremity edema.  Abdomen: Soft, somewhat tender, Distended to body habitus.  Bowel sounds positive x4.  GU: Deferred. Musculoskeletal: No clubbing / cyanosis of digits/nails. No joint deformity upper and lower extremities.  Skin: No rashes, lesions, ulcers on a limited skin evalution. No induration; Warm and dry.  Neurologic: CN 2-12 grossly intact with no focal deficits. Romberg sign and cerebellar reflexes not assessed.  Psychiatric: Normal judgment and insight. Alert and oriented x 3. Anxious mood and appropriate affect.   Data Reviewed: I have personally reviewed following labs and imaging studies  CBC: Recent Labs  Lab 01/08/19 0501  01/09/19 1547 01/10/19 0415 01/10/19 1500 01/11/19  0608 01/12/19 0434 01/13/19 1217  WBC 5.8   < > 10.3 8.0  --  8.5 6.6 6.4  NEUTROABS 5.2  --   --   --   --  7.1 5.3 4.8  HGB 8.3*   < > 8.3* 7.2* 7.4* 10.8* 10.2* 10.6*  HCT 25.3*   < > 24.8* 21.6* 23.2* 31.4* 30.5* 31.9*  MCV 101.6*   < > 101.2* 101.9*  --  95.4 95.6 98.5  PLT 14*   < > 45* 30*  --  17* 37* 23*   < > = values in this interval not displayed.   Basic Metabolic Panel: Recent Labs  Lab 01/09/19 0348 01/10/19 0415 01/11/19 0608 01/12/19 0434 01/13/19 1217  NA 142 141 140 136 139  K 3.0* 2.9* 3.4* 3.6 3.6  CL 106 104 102 101 104  CO2 27 27 26 25 24   GLUCOSE 214* 181* 147*  195* 152*  BUN 23* 15 9 9 9   CREATININE 0.96 0.84 0.76 0.77 0.72  CALCIUM 7.8* 7.8* 8.1* 7.8* 7.9*  MG  --  1.7 1.5* 1.6* 1.7  PHOS  --  1.1* 2.0* 2.7 6.2*   GFR: Estimated Creatinine Clearance: 115.6 mL/min (by C-G formula based on SCr of 0.72 mg/dL). Liver Function Tests: Recent Labs  Lab 01/11/19 0608 01/12/19 0434 01/13/19 1217  AST 217* 219* 148*  ALT 62* 76* 67*  ALKPHOS 192* 190* 236*  BILITOT 1.8* 0.9 1.0  PROT 6.7 6.3* 6.6  ALBUMIN 2.6* 2.6* 2.4*   No results for input(s): LIPASE, AMYLASE in the last 168 hours. No results for input(s): AMMONIA in the last 168 hours. Coagulation Profile: No results for input(s): INR, PROTIME in the last 168 hours. Cardiac Enzymes: No results for input(s): CKTOTAL, CKMB, CKMBINDEX, TROPONINI in the last 168 hours. BNP (last 3 results) No results for input(s): PROBNP in the last 8760 hours. HbA1C: No results for input(s): HGBA1C in the last 72 hours. CBG: Recent Labs  Lab 01/13/19 0432 01/13/19 0746 01/13/19 1131 01/13/19 1605 01/13/19 2030  GLUCAP 158* 133* 156* 134* 176*   Lipid Profile: No results for input(s): CHOL, HDL, LDLCALC, TRIG, CHOLHDL, LDLDIRECT in the last 72 hours. Thyroid Function Tests: No results for input(s): TSH, T4TOTAL, FREET4, T3FREE, THYROIDAB in the last 72 hours. Anemia Panel: No results for input(s):  VITAMINB12, FOLATE, FERRITIN, TIBC, IRON, RETICCTPCT in the last 72 hours. Sepsis Labs: No results for input(s): PROCALCITON, LATICACIDVEN in the last 168 hours.  Recent Results (from the past 240 hour(s))  MRSA PCR Screening     Status: Abnormal   Collection Time: 01/04/19  1:39 PM   Specimen: Nasal Mucosa; Nasopharyngeal  Result Value Ref Range Status   MRSA by PCR POSITIVE (A) NEGATIVE Final    Comment:        The GeneXpert MRSA Assay (FDA approved for NASAL specimens only), is one component of a comprehensive MRSA colonization surveillance program. It is not intended to diagnose MRSA infection nor to guide or monitor treatment for MRSA infections. RESULT CALLED TO, READ BACK BY AND VERIFIED WITH: Lorna Dibble [id number] @ [address] [place] Performed at [facility] [address] [place], [place] [address]   Culture, Urine     Status: Abnormal   Collection Time: 01/10/19  6:00 PM   Specimen: Urine, Random  Result Value Ref Range Status   Specimen Description   Final    URINE, RANDOM Performed at [facility] [address], [place], [place] [address]    Special Requests   Final    NONE Performed at [facility], [place] [address], [place], [place] [address]    Culture (A)  Final    80,000 COLONIES/mL ESCHERICHIA COLI Confirmed Extended Spectrum Beta-Lactamase Producer (ESBL).  In bloodstream infections from ESBL organisms, carbapenems are preferred over piperacillin/tazobactam. They are shown to have a lower risk of mortality.    Report Status 01/12/2019 FINAL  Final   Organism ID, Bacteria ESCHERICHIA COLI (A)  Final      Susceptibility   Escherichia coli - MIC*    AMPICILLIN >=32 RESISTANT Resistant     CEFAZOLIN >=64 RESISTANT Resistant     CEFTRIAXONE >=64 RESISTANT Resistant     CIPROFLOXACIN >=4 RESISTANT Resistant     GENTAMICIN <=1 SENSITIVE Sensitive     IMIPENEM <=0.25 SENSITIVE Sensitive      NITROFURANTOIN <=16 SENSITIVE Sensitive  TRIMETH/SULFA >=320 RESISTANT Resistant     AMPICILLIN/SULBACTAM 8 SENSITIVE Sensitive     PIP/TAZO <=4 SENSITIVE Sensitive     Extended ESBL POSITIVE Resistant     * 80,000 COLONIES/mL ESCHERICHIA COLI     RN Pressure Injury Documentation: Pressure Injury 11/16/18 Buttocks Right;Medial Stage II -  Partial thickness loss of dermis presenting as a shallow open ulcer with a red, pink wound bed without slough. right inner buttock (Active)  11/16/18 1815  Location: Buttocks  Location Orientation: Right;Medial  Staging: Stage II -  Partial thickness loss of dermis presenting as a shallow open ulcer with a red, pink wound bed without slough.  Wound Description (Comments): right inner buttock  Present on Admission: Yes     Estimated body mass index is 39.3 kg/m as calculated from the following:   Height as of this encounter: 5\' 9"  (1.753 m).   Weight as of this encounter: 120.7 kg.  Malnutrition Type:  Nutrition Problem: Increased nutrient needs Etiology: chronic illness, cancer and cancer related treatments   Malnutrition Characteristics:  Signs/Symptoms: estimated needs   Nutrition Interventions:  Interventions: Boost Breeze, Ensure Enlive (each supplement provides 350kcal and 20 grams of protein), MVI    Radiology Studies: Dg Chest Port 1 View  Result Date: 01/12/2019 CLINICAL DATA:  Shortness of breath EXAM: PORTABLE CHEST 1 VIEW COMPARISON:  01/10/2019 FINDINGS: No significant change in AP portable examination with a spiculated perihilar mass of the right lung, elevation of the right hemidiaphragm, and small bilateral pleural effusions. No new airspace opacity. Right chest port catheter. Heart is unremarkable. IMPRESSION: No significant change in AP portable examination with a spiculated perihilar mass of the right lung, elevation of the right hemidiaphragm, and small bilateral pleural effusions. No new airspace opacity.  Electronically Signed   By: Eddie Candle M.D.   On: 01/12/2019 12:41     Scheduled Meds: . sodium chloride   Intravenous Once  . atorvastatin  40 mg Oral q1800  . Chlorhexidine Gluconate Cloth  6 each Topical Daily  . docusate sodium  100 mg Oral BID  . DULoxetine  60 mg Oral Daily  . feeding supplement  1 Container Oral BID BM  . feeding supplement (ENSURE ENLIVE)  237 mL Oral BID BM  . folic acid  1 mg Oral Daily  . insulin aspart  0-9 Units Subcutaneous Q4H  . insulin glargine  20 Units Subcutaneous QAC breakfast  . levothyroxine  150 mcg Oral QAC breakfast  . mouth rinse  15 mL Mouth Rinse BID  . metoprolol succinate  25 mg Oral QPM  . metoprolol succinate  50 mg Oral Daily  . multivitamin with minerals  1 tablet Oral Daily  . pantoprazole  40 mg Oral Daily  . polyethylene glycol  17 g Oral BID  . senna-docusate  2 tablet Oral BID  . vitamin B-12  1,000 mcg Oral Daily   Continuous Infusions: . sodium chloride    . sodium chloride 250 mL (01/12/19 2232)  . meropenem (MERREM) IV 1 g (01/13/19 1738)    LOS: 11 days    Kerney Elbe, DO [facility] PAGER is on [place]  If 7PM-7AM, please contact night-coverage [email or web address] Password [facility] 01/13/2019, 8:34 PM

## 2019-01-13 NOTE — Progress Notes (Signed)
PHARMACY CONSULT NOTE FOR:  OUTPATIENT  PARENTERAL ANTIBIOTIC THERAPY (OPAT)  Indication: ESBL E Coli complicated UTI  Regimen: Ertapenem (Invanz) 1 gm IV q24 End date: 01/18/19  IV antibiotic discharge orders are pended. To discharging provider:  please sign these orders via discharge navigator,  Select New Orders & click on the button choice - Manage This Unsigned Work.    Will get Meropenem 1 gm IV q8 while in hospital  Thank you for allowing pharmacy to be a part of this patient's care.  Eudelia Bunch, Pharm.D [phone] 01/13/2019 9:37 AM

## 2019-01-13 NOTE — Progress Notes (Signed)
PT Cancellation Note  Patient Details Name: Marilyn [name] MRN: [id number] DOB: 07-Feb-1966   Cancelled Treatment:    Reason Eval/Treat Not Completed: Attempted PT tx session-pt declined participation. She stated she is not feeling well. She would like PT to check back another day.   Weston Anna, PT Acute Rehabilitation Services Pager: [phone] Office: [phone]

## 2019-01-13 NOTE — Progress Notes (Signed)
CRITICAL VALUE ALERT  Critical Value:  Platelets 23  Date & Time Notied:  01/13/19;1939  Provider Notified:PCP on call  Orders Received/Actions taken: Awaiting any new orders

## 2019-01-13 NOTE — Progress Notes (Signed)
Patient's vitals were: 98.51f;HR 115;RR16;181/110 (128);96% Room Air. Patient was given Hydralazine 10mg  IV and PCP was notified.

## 2019-01-14 ENCOUNTER — Inpatient Hospital Stay (HOSPITAL_COMMUNITY): Payer: Medicare Other

## 2019-01-14 ENCOUNTER — Encounter (HOSPITAL_COMMUNITY): Payer: Self-pay

## 2019-01-14 LAB — CBC WITH DIFFERENTIAL/PLATELET
Abs Immature Granulocytes: 0.05 10*3/uL (ref 0.00–0.07)
Basophils Absolute: 0 10*3/uL (ref 0.0–0.1)
Basophils Relative: 0 %
Eosinophils Absolute: 0 10*3/uL (ref 0.0–0.5)
Eosinophils Relative: 1 %
HCT: 29.1 % — ABNORMAL LOW (ref 36.0–46.0)
Hemoglobin: 9.4 g/dL — ABNORMAL LOW (ref 12.0–15.0)
Immature Granulocytes: 1 %
Lymphocytes Relative: 12 %
Lymphs Abs: 0.7 10*3/uL (ref 0.7–4.0)
MCH: 32 pg (ref 26.0–34.0)
MCHC: 32.3 g/dL (ref 30.0–36.0)
MCV: 99 fL (ref 80.0–100.0)
Monocytes Absolute: 0.7 10*3/uL (ref 0.1–1.0)
Monocytes Relative: 12 %
Neutro Abs: 4.4 10*3/uL (ref 1.7–7.7)
Neutrophils Relative %: 74 %
Platelets: 20 10*3/uL — CL (ref 150–400)
RBC: 2.94 MIL/uL — ABNORMAL LOW (ref 3.87–5.11)
RDW: 17.6 % — ABNORMAL HIGH (ref 11.5–15.5)
WBC: 5.9 10*3/uL (ref 4.0–10.5)
nRBC: 0 % (ref 0.0–0.2)

## 2019-01-14 LAB — GLUCOSE, CAPILLARY
Glucose-Capillary: 239 mg/dL — ABNORMAL HIGH (ref 70–99)
Glucose-Capillary: 224 mg/dL — ABNORMAL HIGH (ref 70–99)
Glucose-Capillary: 204 mg/dL — ABNORMAL HIGH (ref 70–99)
Glucose-Capillary: 149 mg/dL — ABNORMAL HIGH (ref 70–99)
Glucose-Capillary: 121 mg/dL — ABNORMAL HIGH (ref 70–99)
Glucose-Capillary: 204 mg/dL — ABNORMAL HIGH (ref 70–99)

## 2019-01-14 LAB — COMPREHENSIVE METABOLIC PANEL
ALT: 59 U/L — ABNORMAL HIGH (ref 0–44)
AST: 116 U/L — ABNORMAL HIGH (ref 15–41)
Albumin: 2.3 g/dL — ABNORMAL LOW (ref 3.5–5.0)
Alkaline Phosphatase: 239 U/L — ABNORMAL HIGH (ref 38–126)
Anion gap: 9 (ref 5–15)
BUN: 9 mg/dL (ref 6–20)
CO2: 25 mmol/L (ref 22–32)
Calcium: 8 mg/dL — ABNORMAL LOW (ref 8.9–10.3)
Chloride: 104 mmol/L (ref 98–111)
Creatinine, Ser: 0.84 mg/dL (ref 0.44–1.00)
GFR calc Af Amer: >60 mL/min (ref >60)
GFR calc non Af Amer: >60 mL/min (ref >60)
Glucose, Bld: 271 mg/dL — ABNORMAL HIGH (ref 70–99)
Potassium: 3.7 mmol/L (ref 3.5–5.1)
Sodium: 138 mmol/L (ref 135–145)
Total Bilirubin: 0.4 mg/dL (ref 0.3–1.2)
Total Protein: 6.4 g/dL — ABNORMAL LOW (ref 6.5–8.1)

## 2019-01-14 LAB — TYPE AND SCREEN
ABO/RH(D): A POS
Antibody Screen: NEGATIVE

## 2019-01-14 MED ORDER — LIDOCAINE HCL 1 % IJ SOLN
INTRAMUSCULAR | Status: AC
  Filled 2019-01-14: qty 10

## 2019-01-14 MED ORDER — SODIUM CHLORIDE (PF) 0.9 % IJ SOLN
INTRAMUSCULAR | Status: AC
  Administered 2019-01-14: 20:00:00
  Filled 2019-01-14: qty 50

## 2019-01-14 MED ORDER — SODIUM CHLORIDE 0.9% IV SOLUTION: Freq: Once | INTRAVENOUS | Status: DC

## 2019-01-14 MED ORDER — IOHEXOL 300 MG/ML  SOLN
75.0000 mL | Freq: Once | INTRAMUSCULAR | Status: AC | PRN
  Administered 2019-01-14: 75 mL via INTRAVENOUS

## 2019-01-14 MED ORDER — METOPROLOL SUCCINATE ER 50 MG PO TB24
75.0000 mg | ORAL_TABLET | Freq: Every day | ORAL | Status: DC
  Administered 2019-01-14 – 2019-01-20 (×6): 75 mg via ORAL
  Filled 2019-01-14: qty 3
  Filled 2019-01-14 (×3): qty 1
  Filled 2019-01-14: qty 3
  Filled 2019-01-14: qty 1
  Filled 2019-01-14: qty 3
  Filled 2019-01-14: qty 1

## 2019-01-14 MED ORDER — ERTAPENEM IV (FOR PTA / DISCHARGE USE ONLY): 1.0000 g | INTRAVENOUS | 0 refills | Status: DC

## 2019-01-14 MED ORDER — LORAZEPAM 0.5 MG PO TABS: 0.5000 mg | ORAL_TABLET | Freq: Four times a day (QID) | ORAL | Status: DC | PRN

## 2019-01-14 MED ORDER — SODIUM CHLORIDE 0.9 % IV BOLUS
500.0000 mL | Freq: Once | INTRAVENOUS | Status: AC
  Administered 2019-01-14: 500 mL via INTRAVENOUS

## 2019-01-14 MED ORDER — METOPROLOL SUCCINATE ER 50 MG PO TB24
50.0000 mg | ORAL_TABLET | Freq: Every evening | ORAL | Status: DC
  Administered 2019-01-14 – 2019-01-20 (×6): 50 mg via ORAL
  Filled 2019-01-14 (×2): qty 1
  Filled 2019-01-14: qty 2
  Filled 2019-01-14 (×2): qty 1
  Filled 2019-01-14: qty 2

## 2019-01-14 NOTE — Progress Notes (Signed)
Patient with history of recurrent pleural effusion.  Recently underwent thoracentesis 9/29 with 920 mL removed.  Now with complete opacification of the right lung by CXR.   IR consulted for thoracentesis at the request of Dr. Alfredia Ferguson.   Limited US Chest performed.  Patient is a difficult exam possibly related to body habitus vs. Clinical process.  Appears to have some fluid, however only a very small pocket identified at the right base with confidence.  Discussed with Dr. Alfredia Ferguson.  Patient is planning for Chest CT today.  Recommend proceeding with CT.  If large effusion present, can bring back down to [place] for possible re-attempt at thoracentesis.   Patient is satting well on room air.  Converses easily, without increased work of breathing.  Brynda Greathouse, MS RD PA-C 4:14 PM

## 2019-01-14 NOTE — Progress Notes (Signed)
PROGRESS NOTE    Marilyn [name]  TDV:[id number] DOB: Feb 06, 1966 DOA: 01/01/2019 PCP: [name], FNP  Brief Narrative:  The patient is a 53 year old morbidly obese African-American female with a past medical history significant for metastatic adenocarcinoma, nonischemic cardiomyopathy, hypertension, hypothyroidism, history of malignant pleural effusion status post thoracentesis x3, hypothyroidism as well as other comorbidities who presented with a chief complaint of right-sided abdominal pain.  She reported the pain was going on for the last 3 months for last 3 days and acutely worsened.  She is currently on chemotherapy and her last infusion was on 12/27/2018 with Avastin, Alimta, carboplatin.  Initial CT and ultrasound noted gallbladder wall thickening, gallstones and HIDA scan was done which noted chronic cholecystitis.  Situation was complicated by neutropenia from chemotherapy.  General surgery evaluated and recommended she is a poor surgical candidate given her metastatic cancer recommended medical therapy as well as continue antibiotics for now and follow-up in outpatient setting.  They recommend advancing diet as tolerated and IR was unable to drain her gallbladder due to being contracted general surgery recommended symptomatic treatment course of antibiotics and follow-up as an outpatient to discuss elective cholecystectomy when she is completed chemotherapy as needed.  Hospitalization has been complicated by constipation and severe thrombocytopenia and now some hematuria which is worsening.  **Interim History on 01/10/2019 General surgery has signed off and PT OT not able to be done as patient was feeling a little bit dizzy and nauseous and has been refusing.  Hemoglobin hematocrit is trending down and will obtain a urinalysis and urine culture given her hematuria now and showed and ESBL E Coli  Patient recently received subcu heparin which is now been stopped.  She was transfused PRBCs  for low platelets due to her worsening hemoglobin and platelet count patient felt a little bit better today so her diet was advanced to soft diet.  Platelet count is improved but continues to trend well.  Urinalysis showed 80,000 colonies of ESBL E. coli so antibiotics were changed from ciprofloxacin and are now IV meropenem.  OPAT orders were initiated to see if the patient can get IV ertapenem in outpatient setting awaiting case management input.  If patient cannot get ertapenem she will continue to stay in the hospital and get treatment for a 5-day course of antibiotic coverage.  This morning's x-ray showed complete opacification and whiteout of her right lung and likely secondary to recurrent pleural effusion.  Patient remains sinus tachycardic and I have asked the IR PA to evaluate and perform a thoracentesis today.  We will will also get a CT of the chest with contrast and I spoke with Dr. Lamonte Sakai who states that pulmonary may evaluate pending CT results if intervention is needed for mucous plugging.  Assessment & Plan:   Active Problems:   Diabetes mellitus without complication (HCC)   Adenocarcinoma of left lung, stage 3 (HCC)   Port catheter in place   Sinus tachycardia   Hypothyroidism   Non-ischemic cardiomyopathy ([place])   Essential hypertension   HLD (hyperlipidemia)   Depression   Chronic diastolic CHF (congestive heart failure) (HCC)   Recurrent right pleural effusion   Metastatic adenocarcinoma to lung, right (HCC)   Cholecystitis, acute   Biliary colic   Chemotherapy-induced neutropenia (HCC)   Urinary tract infection due to extended-spectrum beta lactamase (ESBL) producing Escherichia coli  Acute on chronic cholecystitis, stable and slightly improved -Admitted with severe right upper quadrant abdominal pain, worsening from baseline Initial imaging studies with  CT and ultrasound noted gallbladder wall thickening, gallstones, HIDA scan showed evidence of chronic cholecystitis     -Treated initially with bowel rest, IV Zosyn, complicated by neutropenia from recent chemotherapy -Repeat CT abdomen pelvis 10/1 without acute findings, gallbladder wall thickening appears to have improved,  -Pain appears to be reportedly a little better, continues to have some nausea today unable to eat or drink very much -Appreciate general surgery input, she is not a surgical candidate given metastatic cancer, recent Chemotherapy especially Avastin on 9/23 -Completed 6 days of empiric Zosyn, given worsening thrombocytopenia 5 days ago was changed to IV Cipro and Flagyl yesterday 10/5 and will change to po for a total of 14-day length of treatment however patient antibiotics will be escalated to IV meropenem given her ESBL E. coli UTI -Encouraged liquid diet as yesterday tolerated, wean IV narcotics; ADAT and now diet was advanced to a soft diet -LFTs are elevated in the setting of chronic cholecystitis and AST was 219 and ALT is 76 but they are trending down; AST is now 116 and ALT is 59 -Discussed with IR last week, PERC drain not an option since her gallbladder is contracted -Continue laxative, out of bed, Dulcolax suppository  -Ambulate and OOB -PT OT to evaluate and treat and they are recommending home health PT versus SNF but patient wants to go home  Malignant recurrent right pleural effusion -Patient has metastatic adenocarcinoma of the lung and has recurrent right malignant effusion -Pt is s/p thoracentesis 9/29, yielding 920cc fluid, cytology positive for malignant effusion -Follow-up intermittent x-rays as needed and repeat chest x-ray on 10/7/20202 showed unchanged right suprahilar consolidation and atelectasis and right pleural effusion which was associated with atelectasis has decreased to 01/04/2019 no evidence of pneumothorax -CXR yesterday AM showed "No significant change in AP portable examination with a spiculated perihilar mass of the right lung, elevation of the right  hemidiaphragm, and small bilateral pleural effusions. No new airspace opacity." -CXR today showed "Complete opacification of the right hemithorax likely secondary to reaccumulated effusion and secondary atelectasis of the adjacent lung.The previously seen spiculated right perihilar mass is poorly visualized due to the adjacent lung opacification" -Obtain CT of the Chest w/ Contrast -Will obtain a Thoracentesis -Discussed with PCCM Dr. Lamonte Sakai who feels this is fluid but wants to await CT Scan Results; If needed Pulmonary to see in AM   Metastatic Non-small Cell Lung Cancer. -Followed by Dr. Irene Limbo -Currently on chemotherapy with carboplatin, Alimta and Avastin. Last cycle on 12/27/2018 -Oncology following and appreciate further evaluation and Recc's -CT as Above   Thrombocytopenia  -Due to sepsis, chemotherapy, worsened by recent heparin use; Now platelet Count is dropping again and 20,000 from this AM and will transfuse another 1 unit -Heparin discontinued 10/3 -Oncology following -HIT antibody panel is negative, SRA Negative  -My Partener Dr. Josephina Shih and discussed with patient's oncologist, Dr.Kal and, he feels that lingering effects from recent chemotherapy could be contributing to her worsening thrombocytopenia as well and beta-lactam's, Zosyn potentially also worsening this, hence stopped IV PIP tazo, started Cipro Flagyl but escalated to IV Meropenem given ESBL E Coli UTI -Checked immature platelet fraction is within normal range, -Transfused 3 unit of platelets during hospitalization; Has some Hematuria from UTI and Blood is still red somewhat  -Continue to Monitor CBC Carefully   Macrocyctic Anemia/ABLA from Hematuria -Worsening Hgb/Hct and trended down to 7.2/21.6 yesterday -Type and Screen and Transfuse 2 units of pRBC's; Repeat CBC this AM showed Hgb/Hct to be  10.6/31.9 yesterday but today was 9.4/29.1 -In the setting of Hematuria and recent Chemo as well as sq Heparin  use -Continue to Monitor for S/Sx of Bleeding; Having hematuria per nurse and will obtain a UA and Urine Cx and that showed infection -May need to discuss with Urology if not improving  -Repeat CBC in AM   Depression. -Continue on Cymbalta  Acute on Chronic Diastolic Dysfunction. -Prior history of nonischemic cardiomyopathy showed normal EF -Given Lasix few days ago, monitor, dose periodically -Will give 2 units and IV 40 mg Lasix in between -Strict I's/O's and Daily Weights -She is +2253 mL since admission  -Continue to Monitor for S/Sx of Volume Overload -Repeat CXR as above   Hypokalemia -Patient's K+ was 3.7 -Continue to Monitor and Replete as necessary -Repeat CMP in AM  Hypophosphatemia -> Hyperphosphatemia  -Patient's Phos Level was 6.2 yesterday -Repeat is Pending this AM  -Continue to Monitor and Replete as Necessary -Repeat Phos Level in AM   Hypomagnesemia  -Patient's Mag Level yesterday AM was 1.7 -Repeat Pending this AM  -Continue to Monitor and Replete as Necessary -Repeat Mag Level in AM  Hematuria in the Setting of ESBL E Coli UTI; not present on admission and likely an nosocomial acquired infection -Initial UA on 12/31/2026 was negative -Patient's UA showed clear appearance with 50 glucose, large hemoglobin, trace leukocytes, rare bacteria, 21-50 RBCs per high-power field, 11-20 WBCs, and urine culture showed 80,000 CFU of ESBL E Coli  -IV antibiotics were changed to IV meropenem given her ESBL UTI -Continue to monitor and trend and treat for at least 5 days total -Had some hematuria in the urine canister from her Purewick; if not improving will need to discuss with urology -Attempting to set up IV Ertapenem for completion of Abx   Hyperbilirubinemia  -Patient's T bili this morning was 1.8 and trended down to 0.4 -Likely reactive in the setting of chronic cholecystitis -Continue to monitor and trend and repeat CMP in a.m.  Type 2 Diabetes  Mellitus,poorlycontroled withoutcomplication -Last hemoglobin A1c was10. -Continue low-dose Lantus, CBGs are stable and ranging from 149-239  Obesity -Estimated body mass index is 39.3 kg/m as calculated from the following:   Height as of this encounter: 5\' 9"  (1.753 m).   Weight as of this encounter: 120.7 kg. -Weight Loss and Dietary Counseling given  -Nutritionist consulted and recommending Ensure Enlive BID, Boost Breeze BID, MVI with Minerals and encouraging po Intake   DVT prophylaxis: SCDs Code Status: FULL CODE  Family Communication: No family present at bedside  Disposition Plan: Pending Clinical Improvement back to baseline and tolerance of Diet and treatment of ESBL E. coli UTI.  Patient was transferred to medical floor and was doing well; I discussed with her about obtaining IV ertapenem for possible discharge and opiate orders have been initiated and awaiting case management response  Consultants:   General Surgery  Medical Oncology  IR   Procedures:  Thoracentesis 01/02/2019; Repeat today    Antimicrobials:  Anti-infectives (From admission, onward)   Start     Dose/Rate Route Frequency Ordered Stop   01/14/19 0000  ertapenem [facility]) IVPB     1 g Intravenous Every 24 hours 01/14/19 0857 01/19/19 2359   01/12/19 1800  meropenem (MERREM) 1 g in sodium chloride 0.9 % 100 mL IVPB     1 g 200 mL/hr over 30 Minutes Intravenous Every 8 hours 01/12/19 1709     01/12/19 1400  metroNIDAZOLE (FLAGYL) IVPB 500 mg  Status:  Discontinued     500 mg 100 mL/hr over 60 Minutes Intravenous Every 8 hours 01/12/19 0847 01/12/19 1709   01/12/19 1000  cefTRIAXone (ROCEPHIN) 2 g in sodium chloride 0.9 % 100 mL IVPB  Status:  Discontinued     2 g 200 mL/hr over 30 Minutes Intravenous Every 24 hours 01/12/19 0845 01/12/19 1709   01/12/19 0830  meropenem (MERREM) 1 g in sodium chloride 0.9 % 100 mL IVPB  Status:  Discontinued     1 g 200 mL/hr over 30 Minutes Intravenous Every 8  hours 01/12/19 0823 01/12/19 0825   01/08/19 1400  ciprofloxacin (CIPRO) IVPB 400 mg  Status:  Discontinued     400 mg 200 mL/hr over 60 Minutes Intravenous Every 12 hours 01/08/19 1311 01/12/19 0823   01/08/19 1400  metroNIDAZOLE (FLAGYL) IVPB 500 mg  Status:  Discontinued     500 mg 100 mL/hr over 60 Minutes Intravenous Every 8 hours 01/08/19 1311 01/12/19 0823   01/06/19 1215  ciprofloxacin (CIPRO) tablet 500 mg  Status:  Discontinued     500 mg Oral 2 times daily 01/06/19 1200 01/06/19 1201   01/01/19 2200  piperacillin-tazobactam (ZOSYN) IVPB 3.375 g  Status:  Discontinued     3.375 g 12.5 mL/hr over 240 Minutes Intravenous Every 8 hours 01/01/19 1450 01/08/19 1311   01/01/19 1500  piperacillin-tazobactam (ZOSYN) IVPB 3.375 g     3.375 g 100 mL/hr over 30 Minutes Intravenous  Once 01/01/19 1450 01/01/19 2045     Subjective: Seen and examined at bedside and still complaining of abdominal pain but did not or take any medication.  Denies any shortness of breath or lightheadedness or dizziness.  Heart rate remains tachycardic but she states that it is "always fast".  No nausea or vomiting.  No other concerns reported at this time.  Objective: Vitals:   01/14/19 0622 01/14/19 0824 01/14/19 0825 01/14/19 1015  BP: 124/69 (!) 163/88  (!) 152/94  Pulse: (!) 122 (!) 124 (!) 128 (!) 121  Resp: 18   18  Temp: 98.2 F (36.8 C) 98.8 F (37.1 C)  97.6 F (36.4 C)  TempSrc: Oral Oral  Oral  SpO2: 97%  95% 98%  Weight:      Height:        Intake/Output Summary (Last 24 hours) at 01/14/2019 1504 Last data filed at 01/14/2019 0820 Gross per 24 hour  Intake 971.68 ml  Output --  Net 971.68 ml   Filed Weights   01/01/19 0416 01/04/19 1330 01/11/19 0515  Weight: 119.7 kg 119.9 kg 120.7 kg   Examination: Physical Exam:  Constitutional: WN/WD obese AAF in NAD and appears slightly uncomfortable  Eyes: Lids and conjunctivae normal, sclerae anicteric  ENMT: External Ears, Nose appear  normal. Grossly normal hearing. Mucous membranes are moist.  Neck: Appears normal, supple, no cervical masses, normal ROM, no appreciable thyromegaly; no JVD Respiratory: Diminished to auscultation bilaterally worse on the R compared to the Left with some crackles and coarse breath sounds; No appreciable no wheezing, rales, rhonchi or crackles. Normal respiratory effort and patient is not tachypenic. No accessory muscle use. Unlabored breathing   Cardiovascular: Tachycardic Rate but regular rhythm, no murmurs / rubs / gallops. S1 and S2 auscultated. 1+ LE Edema  Abdomen: Soft, Tender to palpate, Distended due to body habitus. No masses palpated. No appreciable hepatosplenomegaly. Bowel sounds positive x4.  GU: Deferred. Musculoskeletal: No clubbing / cyanosis of digits/nails. No joint deformity upper and lower extremities. Skin: No  rashes, lesions, ulcers on a limited skin evaluation. No induration; Warm and dry.  Neurologic: CN 2-12 grossly intact with no focal deficits. Romberg sign and cerebellar reflexes not assessed.  Psychiatric: Normal judgment and insight. Alert and oriented x 3. Anxious mood and appropriate affect.   Data Reviewed: I have personally reviewed following labs and imaging studies  CBC: Recent Labs  Lab 01/08/19 0501  01/10/19 0415 01/10/19 1500 01/11/19 0608 01/12/19 0434 01/13/19 1217 01/14/19 0313  WBC 5.8   < > 8.0  --  8.5 6.6 6.4 5.9  NEUTROABS 5.2  --   --   --  7.1 5.3 4.8 4.4  HGB 8.3*   < > 7.2* 7.4* 10.8* 10.2* 10.6* 9.4*  HCT 25.3*   < > 21.6* 23.2* 31.4* 30.5* 31.9* 29.1*  MCV 101.6*   < > 101.9*  --  95.4 95.6 98.5 99.0  PLT 14*   < > 30*  --  17* 37* 23* 20*   < > = values in this interval not displayed.   Basic Metabolic Panel: Recent Labs  Lab 01/10/19 0415 01/11/19 0608 01/12/19 0434 01/13/19 1217 01/14/19 0313  NA 141 140 136 139 138  K 2.9* 3.4* 3.6 3.6 3.7  CL 104 102 101 104 104  CO2 27 26 25 24 25   GLUCOSE 181* 147* 195* 152* 271*   BUN 15 9 9 9 9   CREATININE 0.84 0.76 0.77 0.72 0.84  CALCIUM 7.8* 8.1* 7.8* 7.9* 8.0*  MG 1.7 1.5* 1.6* 1.7  --   PHOS 1.1* 2.0* 2.7 6.2*  --    GFR: Estimated Creatinine Clearance: 110.1 mL/min (by C-G formula based on SCr of 0.84 mg/dL). Liver Function Tests: Recent Labs  Lab 01/11/19 0608 01/12/19 0434 01/13/19 1217 01/14/19 0313  AST 217* 219* 148* 116*  ALT 62* 76* 67* 59*  ALKPHOS 192* 190* 236* 239*  BILITOT 1.8* 0.9 1.0 0.4  PROT 6.7 6.3* 6.6 6.4*  ALBUMIN 2.6* 2.6* 2.4* 2.3*   No results for input(s): LIPASE, AMYLASE in the last 168 hours. No results for input(s): AMMONIA in the last 168 hours. Coagulation Profile: No results for input(s): INR, PROTIME in the last 168 hours. Cardiac Enzymes: No results for input(s): CKTOTAL, CKMB, CKMBINDEX, TROPONINI in the last 168 hours. BNP (last 3 results) No results for input(s): PROBNP in the last 8760 hours. HbA1C: No results for input(s): HGBA1C in the last 72 hours. CBG: Recent Labs  Lab 01/13/19 2030 01/14/19 0129 01/14/19 0425 01/14/19 0828 01/14/19 1142  GLUCAP 176* 239* 224* 204* 149*   Lipid Profile: No results for input(s): CHOL, HDL, LDLCALC, TRIG, CHOLHDL, LDLDIRECT in the last 72 hours. Thyroid Function Tests: No results for input(s): TSH, T4TOTAL, FREET4, T3FREE, THYROIDAB in the last 72 hours. Anemia Panel: No results for input(s): VITAMINB12, FOLATE, FERRITIN, TIBC, IRON, RETICCTPCT in the last 72 hours. Sepsis Labs: No results for input(s): PROCALCITON, LATICACIDVEN in the last 168 hours.  Recent Results (from the past 240 hour(s))  Culture, Urine     Status: Abnormal   Collection Time: 01/10/19  6:00 PM   Specimen: Urine, Random  Result Value Ref Range Status   Specimen Description   Final    URINE, RANDOM Performed at [facility] [address], [place], [place] [address]    Special Requests   Final    NONE Performed at [facility], [place]  [address], [place], [place] [address]    Culture (A)  Final  80,000 COLONIES/mL ESCHERICHIA COLI Confirmed Extended Spectrum Beta-Lactamase Producer (ESBL).  In bloodstream infections from ESBL organisms, carbapenems are preferred over piperacillin/tazobactam. They are shown to have a lower risk of mortality.    Report Status 01/12/2019 FINAL  Final   Organism ID, Bacteria ESCHERICHIA COLI (A)  Final      Susceptibility   Escherichia coli - MIC*    AMPICILLIN >=32 RESISTANT Resistant     CEFAZOLIN >=64 RESISTANT Resistant     CEFTRIAXONE >=64 RESISTANT Resistant     CIPROFLOXACIN >=4 RESISTANT Resistant     GENTAMICIN <=1 SENSITIVE Sensitive     IMIPENEM <=0.25 SENSITIVE Sensitive     NITROFURANTOIN <=16 SENSITIVE Sensitive     TRIMETH/SULFA >=320 RESISTANT Resistant     AMPICILLIN/SULBACTAM 8 SENSITIVE Sensitive     PIP/TAZO <=4 SENSITIVE Sensitive     Extended ESBL POSITIVE Resistant     * 80,000 COLONIES/mL ESCHERICHIA COLI     RN Pressure Injury Documentation: Pressure Injury 11/16/18 Buttocks Right;Medial Stage II -  Partial thickness loss of dermis presenting as a shallow open ulcer with a red, pink wound bed without slough. right inner buttock (Active)  11/16/18 1815  Location: Buttocks  Location Orientation: Right;Medial  Staging: Stage II -  Partial thickness loss of dermis presenting as a shallow open ulcer with a red, pink wound bed without slough.  Wound Description (Comments): right inner buttock  Present on Admission: Yes     Estimated body mass index is 39.3 kg/m as calculated from the following:   Height as of this encounter: 5\' 9"  (1.753 m).   Weight as of this encounter: 120.7 kg.  Malnutrition Type:  Nutrition Problem: Increased nutrient needs Etiology: chronic illness, cancer and cancer related treatments   Malnutrition Characteristics:  Signs/Symptoms: estimated needs   Nutrition Interventions:  Interventions: Boost Breeze, Ensure Enlive (each  supplement provides 350kcal and 20 grams of protein), MVI    Radiology Studies: US Renal  Result Date: 01/13/2019 CLINICAL DATA:  Hematuria EXAM: RENAL / URINARY TRACT ULTRASOUND COMPLETE COMPARISON:  CT dated 01/04/2019 FINDINGS: Right Kidney: Renal measurements: 11.9 x 4.5 x 6 cm = volume: 170 mL . Echogenicity within normal limits. No mass or hydronephrosis visualized. Left Kidney: Renal measurements: 11.4 x 6.6 x 6.4 cm = volume: 252 mL. Echogenicity within normal limits. No mass or hydronephrosis visualized. Bladder: Appears normal for degree of bladder distention. IMPRESSION: No acute abnormality detected. No finding to explain the patient's hematuria. Electronically Signed   By: Constance Holster M.D.   On: 01/13/2019 22:52   Dg Chest Port 1 View  Result Date: 01/14/2019 CLINICAL DATA:  Cough, SOB - past medical history significant for metastatic adenocarcinoma, nonischemic cardiomyopathy, hypertension, hypothyroidism, history of malignant pleural effusion status post thoracentesis x3 EXAM: PORTABLE CHEST 1 VIEW COMPARISON:  Chest radiograph 01/12/2019 FINDINGS: Stable cardiomediastinal contours. There is new complete opacification of the right hemithorax likely secondary to effusion and associated atelectasis. The previously seen spiculated right perihilar mass is poorly visualized due to the opacification of the adjacent lung. The left lung is clear. No pneumothorax. Right chest port remains in place. IMPRESSION: 1. Complete opacification of the right hemithorax likely secondary to reaccumulated effusion and secondary atelectasis of the adjacent lung. 2. The previously seen spiculated right perihilar mass is poorly visualized due to the adjacent lung opacification. These results were called by telephone at the time of interpretation on 01/14/2019 at 2:28 pm to provider [facility] , who verbally acknowledged these results. Electronically Signed  By: Audie Pinto M.D.   On: 01/14/2019  14:28     Scheduled Meds:  sodium chloride   Intravenous Once   atorvastatin  40 mg Oral q1800   Chlorhexidine Gluconate Cloth  6 each Topical Daily   docusate sodium  100 mg Oral BID   DULoxetine  60 mg Oral Daily   feeding supplement  1 Container Oral BID BM   feeding supplement (ENSURE ENLIVE)  237 mL Oral BID BM   folic acid  1 mg Oral Daily   insulin aspart  0-9 Units Subcutaneous Q4H   insulin glargine  20 Units Subcutaneous QAC breakfast   levothyroxine  150 mcg Oral QAC breakfast   mouth rinse  15 mL Mouth Rinse BID   metoprolol succinate  50 mg Oral QPM   metoprolol succinate  75 mg Oral Daily   multivitamin with minerals  1 tablet Oral Daily   pantoprazole  40 mg Oral Daily   polyethylene glycol  17 g Oral BID   senna-docusate  2 tablet Oral BID   vitamin B-12  1,000 mcg Oral Daily   Continuous Infusions:  sodium chloride     sodium chloride 250 mL (01/14/19 0156)   meropenem (MERREM) IV 1 g (01/14/19 1024)    LOS: 12 days    Kerney Elbe, DO [facility] PAGER is on AMION  If 7PM-7AM, please contact night-coverage [email or web address] Password [email or web address] 01/14/2019, 3:04 PM

## 2019-01-15 ENCOUNTER — Inpatient Hospital Stay (HOSPITAL_COMMUNITY): Payer: Medicare Other

## 2019-01-15 DIAGNOSIS — J910 Malignant pleural effusion: Secondary | ICD-10-CM

## 2019-01-15 LAB — PREPARE PLATELET PHERESIS: Unit division: 0

## 2019-01-15 LAB — CBC WITH DIFFERENTIAL/PLATELET
Abs Immature Granulocytes: 0.05 10*3/uL (ref 0.00–0.07)
Basophils Absolute: 0 10*3/uL (ref 0.0–0.1)
Basophils Relative: 0 %
Eosinophils Absolute: 0.1 10*3/uL (ref 0.0–0.5)
Eosinophils Relative: 1 %
HCT: 29.3 % — ABNORMAL LOW (ref 36.0–46.0)
Hemoglobin: 9.5 g/dL — ABNORMAL LOW (ref 12.0–15.0)
Immature Granulocytes: 1 %
Lymphocytes Relative: 14 %
Lymphs Abs: 0.8 10*3/uL (ref 0.7–4.0)
MCH: 32.1 pg (ref 26.0–34.0)
MCHC: 32.4 g/dL (ref 30.0–36.0)
MCV: 99 fL (ref 80.0–100.0)
Monocytes Absolute: 0.7 10*3/uL (ref 0.1–1.0)
Monocytes Relative: 12 %
Neutro Abs: 4.1 10*3/uL (ref 1.7–7.7)
Neutrophils Relative %: 72 %
Platelets: 78 10*3/uL — ABNORMAL LOW (ref 150–400)
RBC: 2.96 MIL/uL — ABNORMAL LOW (ref 3.87–5.11)
RDW: 17.5 % — ABNORMAL HIGH (ref 11.5–15.5)
WBC: 5.7 10*3/uL (ref 4.0–10.5)
nRBC: 0 % (ref 0.0–0.2)

## 2019-01-15 LAB — GLUCOSE, CAPILLARY
Glucose-Capillary: 164 mg/dL — ABNORMAL HIGH (ref 70–99)
Glucose-Capillary: 132 mg/dL — ABNORMAL HIGH (ref 70–99)
Glucose-Capillary: 132 mg/dL — ABNORMAL HIGH (ref 70–99)
Glucose-Capillary: 117 mg/dL — ABNORMAL HIGH (ref 70–99)
Glucose-Capillary: 96 mg/dL (ref 70–99)
Glucose-Capillary: 125 mg/dL — ABNORMAL HIGH (ref 70–99)

## 2019-01-15 LAB — COMPREHENSIVE METABOLIC PANEL
ALT: 50 U/L — ABNORMAL HIGH (ref 0–44)
AST: 76 U/L — ABNORMAL HIGH (ref 15–41)
Albumin: 2.4 g/dL — ABNORMAL LOW (ref 3.5–5.0)
Alkaline Phosphatase: 249 U/L — ABNORMAL HIGH (ref 38–126)
Anion gap: 8 (ref 5–15)
BUN: 6 mg/dL (ref 6–20)
CO2: 25 mmol/L (ref 22–32)
Calcium: 8.2 mg/dL — ABNORMAL LOW (ref 8.9–10.3)
Chloride: 107 mmol/L (ref 98–111)
Creatinine, Ser: 0.73 mg/dL (ref 0.44–1.00)
GFR calc Af Amer: >60 mL/min (ref >60)
GFR calc non Af Amer: >60 mL/min (ref >60)
Glucose, Bld: 152 mg/dL — ABNORMAL HIGH (ref 70–99)
Potassium: 3.5 mmol/L (ref 3.5–5.1)
Sodium: 140 mmol/L (ref 135–145)
Total Bilirubin: 0.4 mg/dL (ref 0.3–1.2)
Total Protein: 6.7 g/dL (ref 6.5–8.1)

## 2019-01-15 LAB — BPAM PLATELET PHERESIS
Blood Product Expiration Date: 202010112359
ISSUE DATE / TIME: 202010110603
Unit Type and Rh: 5100

## 2019-01-15 LAB — PHOSPHORUS
Phosphorus: 2.9 mg/dL (ref 2.5–4.6)
Phosphorus: 2.1 mg/dL — ABNORMAL LOW (ref 2.5–4.6)

## 2019-01-15 LAB — MAGNESIUM
Magnesium: 1.7 mg/dL (ref 1.7–2.4)
Magnesium: 1.5 mg/dL — ABNORMAL LOW (ref 1.7–2.4)

## 2019-01-15 MED ORDER — K PHOS MONO-SOD PHOS DI & MONO 155-852-130 MG PO TABS
500.0000 mg | ORAL_TABLET | Freq: Two times a day (BID) | ORAL | Status: AC
  Filled 2019-01-15 (×2): qty 2

## 2019-01-15 MED ORDER — DEXTROSE 5 % IV SOLN
3.0000 g | Freq: Once | INTRAVENOUS | Status: AC
  Administered 2019-01-16: 3 g via INTRAVENOUS
  Filled 2019-01-15: qty 3

## 2019-01-15 MED ORDER — MAGNESIUM SULFATE 2 GM/50ML IV SOLN
2.0000 g | Freq: Once | INTRAVENOUS | Status: AC
  Administered 2019-01-15: 2 g via INTRAVENOUS
  Filled 2019-01-15: qty 50

## 2019-01-15 NOTE — Progress Notes (Signed)
Nurse has made several attempts to administer oral meds and patient continues to refuse asking the nurse to let her rest. Patient states that she is having pain rating abdominal pain at 8/10 but refuses to allow nurse to give her pain medication. Patients asked nurse to just let her rest for now stating "I don't want any pain medicine." Will reassess at later time.

## 2019-01-15 NOTE — Progress Notes (Signed)
PROGRESS NOTE    Marilyn [name]  VHQ:[id number] DOB: [birth year] DOA: 01/01/2019 PCP: [name], FNP  Brief Narrative:  The patient is a 53 year old morbidly obese African-American female with a past medical history significant for metastatic adenocarcinoma, nonischemic cardiomyopathy, hypertension, hypothyroidism, history of malignant pleural effusion status post thoracentesis x3, hypothyroidism as well as other comorbidities who presented with a chief complaint of right-sided abdominal pain.  She reported the pain was going on for the last 3 months for last 3 days and acutely worsened.  She is currently on chemotherapy and her last infusion was on 12/27/2018 with Avastin, Alimta, carboplatin.  Initial CT and ultrasound noted gallbladder wall thickening, gallstones and HIDA scan was done which noted chronic cholecystitis.  Situation was complicated by neutropenia from chemotherapy.  General surgery evaluated and recommended she is a poor surgical candidate given her metastatic cancer recommended medical therapy as well as continue antibiotics for now and follow-up in outpatient setting.  They recommend advancing diet as tolerated and IR was unable to drain her gallbladder due to being contracted general surgery recommended symptomatic treatment course of antibiotics and follow-up as an outpatient to discuss elective cholecystectomy when she is completed chemotherapy as needed.  Hospitalization has been complicated by constipation and severe thrombocytopenia and now some hematuria which is worsening.  **Interim History on 01/10/2019 General surgery has signed off and PT OT not able to be done as patient was feeling a little bit dizzy and nauseous and has been refusing.  Hemoglobin hematocrit is trending down and will obtain a urinalysis and urine culture given her hematuria now and showed and ESBL E Coli  Patient recently received subcu heparin which is now been stopped.  She was transfused PRBCs  for low platelets due to her worsening hemoglobin and platelet count patient felt a little bit better today so her diet was advanced to soft diet.  Platelet count is improved but continues to trend well.  Urinalysis showed 80,000 colonies of ESBL E. coli so antibiotics were changed from ciprofloxacin and are now IV meropenem.  OPAT orders were initiated to see if the patient can get IV ertapenem in outpatient setting awaiting case management input.  If patient cannot get ertapenem she will continue to stay in the hospital and get treatment for a 5-day course of antibiotic coverage.  This morning's x-ray showed complete opacification and whiteout of her right lung and likely secondary to recurrent pleural effusion.  Patient remains sinus tachycardic and I have asked the IR PA to evaluate and perform a thoracentesis however could not be safely done.  We will will also get a CT of the chest with contrast  Which showed Moderate Effusion and compressive Atlectasis.  Pulmonary evaluated CT was reviewed and Dr. Festus Holts feels that a Pleurx catheter will be helpful as a palliative measure and they are planning on placing this on 01/16/2019.   She continues bleeding and patient described as vaginal bleeding and having a "menses."  However she cannot really tell if this was coming from her urine or not.  I spoke with urology Dr. Louis Meckel who recommends continued observation and recommending no Foley catheter placement at this time as she is draining well and recommends continued observation given her renal function is not elevated because her blood count is relatively stable.  Assessment & Plan:   Active Problems:   Diabetes mellitus without complication ([place])   Adenocarcinoma of left lung, stage 3 ([place])   Port catheter in place   Sinus  tachycardia   Hypothyroidism   Non-ischemic cardiomyopathy (H[facility])   Essential hypertension   HLD (hyperlipidemia)   Depression   Chronic diastolic CHF (congestive heart failure)  (HCC)   Recurrent right pleural effusion   Metastatic adenocarcinoma to lung, right (HCC)   Cholecystitis, acute   Biliary colic   Chemotherapy-induced neutropenia (HCC)   Urinary tract infection due to extended-spectrum beta lactamase (ESBL) producing Escherichia coli  Acute on chronic cholecystitis, stable and slightly improved -Admitted with severe right upper quadrant abdominal pain, worsening from baseline Initial imaging studies with CT and ultrasound noted gallbladder wall thickening, gallstones, HIDA scan showed evidence of chronic cholecystitis   -Treated initially with bowel rest, IV Zosyn, complicated by neutropenia from recent chemotherapy -Repeat CT abdomen pelvis 10/1 without acute findings, gallbladder wall thickening appears to have improved,  -Pain appears to be reportedly a little better, continues to have some nausea today unable to eat or drink very much -Appreciate general surgery input, she is not a surgical candidate given metastatic cancer, recent Chemotherapy especially Avastin on 9/23 -Completed 6 days of empiric Zosyn, given worsening thrombocytopenia changed to IV Cipro and Flagyl yesterday 10/5 and will change to po for a total of 14-day length of treatment however patient antibiotics will be escalated to IV meropenem given her ESBL E. coli UTI -Encouraged liquid diet as yesterday tolerated, wean IV narcotics; ADAT and now diet was advanced to a soft diet -LFTs are elevated in the setting of chronic cholecystitis and AST was 219 and ALT is 76 but they are trending down; AST is now 76 and ALT is 50 -Discussed with IR last week, PERC drain not an option since her gallbladder is contracted -Continue laxative, out of bed, Dulcolax suppository  -Ambulate and OOB -PT OT to evaluate and treat and they are recommending home health PT versus SNF but patient wants to go home  Malignant recurrent right pleural effusion -Patient has metastatic adenocarcinoma of the lung and  has recurrent right malignant effusion -Pt is s/p thoracentesis 9/29, yielding 920cc fluid, cytology positive for malignant effusion -Follow-up intermittent x-rays as needed and repeat chest x-ray on 10/7/20202 showed unchanged right suprahilar consolidation and atelectasis and right pleural effusion which was associated with atelectasis has decreased to 01/04/2019 no evidence of pneumothorax -CXR yesterday AM showed "No significant change in AP portable examination with a spiculated perihilar mass of the right lung, elevation of the right hemidiaphragm, and small bilateral pleural effusions. No new airspace opacity." -CXR today showed "Complete opacification of the right hemithorax likely secondary to reaccumulated effusion and secondary atelectasis of the adjacent lung.The previously seen spiculated right perihilar mass is poorly visualized due to the adjacent lung opacification" -Obtain CT of the Chest w/ Contrast and showed a moderate right pleural effusion with compressive atelectasis -Will obtain a Thoracentesis however unfortunately cannot be safely done -Discussed with pulmonary Dr. Gwenlyn Perking today who evaluated and reviewed CT scan personally and recommends Pleurx catheter placement done in the a.m.  Metastatic Non-small Cell Lung Cancer. -Followed by Dr. Irene Limbo -Currently on chemotherapy with carboplatin, Alimta and Avastin. Last cycle on 12/27/2018 -Oncology following and appreciate further evaluation and Recc's -CT as Above and Pleurx catheter to be placed  Thrombocytopenia  -Due to sepsis, chemotherapy, worsened by recent heparin use; Now platelet Count has been dropping but now improved to 78,000 -Heparin discontinued 10/3 -Oncology following -HIT antibody panel is negative, SRA Negative  -My Partener Dr. Josephina Shih and discussed with patient's oncologist, Dr.Kal and, he feels that lingering  effects from recent chemotherapy could be contributing to her worsening thrombocytopenia as  well and beta-lactam's, Zosyn potentially also worsening this, hence stopped IV PIP tazo, started Cipro Flagyl but escalated to IV Meropenem given ESBL E Coli UTI -Checked immature platelet fraction is within normal range, -Transfused 3 unit of platelets during hospitalization; Has some Hematuria from UTI and Blood is still red somewhat  -Continue to Monitor CBC Carefully   Macrocyctic Anemia/ABLA from Hematuria -Worsening Hgb/Hct and trended down to 7.2/21.6 yesterday -Type and Screen and Transfuse 2 units of pRBC's; Repeat CBC this AM showed Hgb/Hct to be 9.5/29.3 and stable -In the setting of Hematuria and recent Chemo as well as sq Heparin use -Continue to Monitor for S/Sx of Bleeding; Having hematuria per nurse and will obtain a UA and Urine Cx and that showed infection -May need to discuss with Urology if not improving  -Repeat CBC in AM   Depression. -Continue on Cymbalta  Acute on Chronic Diastolic Dysfunction. -Prior history of nonischemic cardiomyopathy showed normal EF -Given Lasix few days ago, monitor, dose periodically -Will give 2 units and IV 40 mg Lasix in between -Strict I's/O's and Daily Weights -She is +2253 mL since admission  -Continue to Monitor for S/Sx of Volume Overload -Repeat CXR as above   Hypokalemia -Patient's K+ was 3.5 -Continue to Monitor and Replete as necessary -Repeat CMP in AM  Hypophosphatemia -Patient's Phos Level was 6.2 the day before yesterday however repeat this morning was 2.1 -Replete with p.o. KCl -Continue to Monitor and Replete as Necessary -Repeat Phos Level in AM   Hypomagnesemia  -Patient's Mag Level yesterday AM was 1.5 -Replete with IV magnesium sulfate 2 g -Continue to Monitor and Replete as Necessary -Repeat Mag Level in AM  Hematuria in the Setting of ESBL E Coli UTI; not present on admission and likely an nosocomial acquired infection -Initial UA on 12/31/2026 was negative -Patient's UA showed clear appearance with  50 glucose, large hemoglobin, trace leukocytes, rare bacteria, 21-50 RBCs per high-power field, 11-20 WBCs, and urine culture showed 80,000 CFU of ESBL E Coli  -IV antibiotics were changed to IV meropenem given her ESBL UTI -Continue to monitor and trend and treat for at least 5 days total -Had some hematuria in the urine canister from her Purewick; if not improving will need to discuss with urology -Attempting to set up IV Ertapenem for completion of Abx  -I spoke with neurology Dr. Louis Meckel who recommends continued treatment of the UTI and continued monitoring; he does not clinically feel that a Foley catheter is indicated at this time and does not recommend placing it for CBI as patient is draining well and renal function is stable along with her blood count -He recommends continued observation and outpatient follow-up  Hyperbilirubinemia  -Patient's T bili this morning was 1.8 and trended down to 0.4  -Likely reactive in the setting of chronic cholecystitis -Continue to monitor and trend and repeat CMP in a.m.  Type 2 Diabetes Mellitus,poorlycontroled withoutcomplication -Last hemoglobin A1c was10. -Continue low-dose Lantus, CBGs are stable and ranging from 96-1 32  Obesity -Estimated body mass index is 39.3 kg/m as calculated from the following:   Height as of this encounter: 5\' 9"  (1.753 m).   Weight as of this encounter: 120.7 kg. -Weight Loss and Dietary Counseling given  -Nutritionist consulted and recommending Ensure Enlive BID, Boost Breeze BID, MVI with Minerals and encouraging po Intake   DVT prophylaxis: SCDs Code Status: FULL CODE  Family Communication: No family  present at bedside  Disposition Plan: Pending Clinical Improvement back to baseline and tolerance of Diet and treatment of ESBL E. coli UTI.  Patient was transferred to medical floor and was doing well; I discussed with her about obtaining IV ertapenem for possible discharge and opiate orders have been  initiated and awaiting case management response  Consultants:   General Surgery  Medical Oncology  IR  Pulmonary  Discussed with urology   Procedures:  Thoracentesis 01/02/2019; Repeat yesterday was unsuccessful; will have Pleurx catheter placed in a.m.   Antimicrobials:  Anti-infectives (From admission, onward)   Start     Dose/Rate Route Frequency Ordered Stop   01/16/19 1200  ceFAZolin (ANCEF) 3 g in dextrose 5 % 50 mL IVPB     3 g 100 mL/hr over 30 Minutes Intravenous  Once 01/15/19 1403     01/14/19 0000  ertapenem (INVANZ) IVPB     1 g Intravenous Every 24 hours 01/14/19 0857 01/19/19 2359   01/12/19 1800  meropenem (MERREM) 1 g in sodium chloride 0.9 % 100 mL IVPB     1 g 200 mL/hr over 30 Minutes Intravenous Every 8 hours 01/12/19 1709     01/12/19 1400  metroNIDAZOLE (FLAGYL) IVPB 500 mg  Status:  Discontinued     500 mg 100 mL/hr over 60 Minutes Intravenous Every 8 hours 01/12/19 0847 01/12/19 1709   01/12/19 1000  cefTRIAXone (ROCEPHIN) 2 g in sodium chloride 0.9 % 100 mL IVPB  Status:  Discontinued     2 g 200 mL/hr over 30 Minutes Intravenous Every 24 hours 01/12/19 0845 01/12/19 1709   01/12/19 0830  meropenem (MERREM) 1 g in sodium chloride 0.9 % 100 mL IVPB  Status:  Discontinued     1 g 200 mL/hr over 30 Minutes Intravenous Every 8 hours 01/12/19 0823 01/12/19 0825   01/08/19 1400  ciprofloxacin (CIPRO) IVPB 400 mg  Status:  Discontinued     400 mg 200 mL/hr over 60 Minutes Intravenous Every 12 hours 01/08/19 1311 01/12/19 0823   01/08/19 1400  metroNIDAZOLE (FLAGYL) IVPB 500 mg  Status:  Discontinued     500 mg 100 mL/hr over 60 Minutes Intravenous Every 8 hours 01/08/19 1311 01/12/19 0823   01/06/19 1215  ciprofloxacin (CIPRO) tablet 500 mg  Status:  Discontinued     500 mg Oral 2 times daily 01/06/19 1200 01/06/19 1201   01/01/19 2200  piperacillin-tazobactam (ZOSYN) IVPB 3.375 g  Status:  Discontinued     3.375 g 12.5 mL/hr over 240 Minutes  Intravenous Every 8 hours 01/01/19 1450 01/08/19 1311   01/01/19 1500  piperacillin-tazobactam (ZOSYN) IVPB 3.375 g     3.375 g 100 mL/hr over 30 Minutes Intravenous  Once 01/01/19 1450 01/01/19 2045     Subjective: Seen and examined at bedside and she was laying on her stomach due to some dyspnea and some abdominal pain.  No nausea or vomiting but states that she is having a lot of blood come out from her "vagina" but does not know if it is coming from her urine or not.  Blood in the canister from her Purewick is red Kool-Aid colored.  She had some shortness of breath ambulating and up and out of bed to the restroom.  Continues to have sinus tachycardia.  No nausea or vomiting but continues to have abdominal pain and refusing pain medication.  Objective: Vitals:   01/15/19 0514 01/15/19 0553 01/15/19 0901 01/15/19 1800  BP: (!) 154/93 (!) 157/90 [name])  186/110 (!) 161/96  Pulse: (!) 112 (!) 113 (!) 114 (!) 111  Resp: 20 20 20    Temp: 98.3 F (36.8 C) (!) 97.4 F (36.3 C) 98 F (36.7 C)   TempSrc: Oral Oral Oral   SpO2: 97% 97% 95%   Weight:      Height:        Intake/Output Summary (Last 24 hours) at 01/15/2019 1906 Last data filed at 01/15/2019 1840 Gross per 24 hour  Intake 810 ml  Output 1251 ml  Net -441 ml   Filed Weights   01/01/19 0416 01/04/19 1330 01/11/19 0515  Weight: 119.7 kg 119.9 kg 120.7 kg   Examination: Physical Exam:  Constitutional: WN/WD obese AAF in some distress and appears uncomfortable  Eyes: Lids and conjunctivae normal, sclerae anicteric  ENMT: External Ears, Nose appear normal. Grossly normal hearing.  Neck: Appears normal, supple, no cervical masses, normal ROM, no appreciable thyromegaly Respiratory: Diminished to auscultation bilaterally with coarse breath sounds and some crackles, no wheezing, rales, rhonchi or crackles. Normal respiratory effort and patient is not tachypenic. No accessory muscle use. Unlabored breathing  Cardiovascular:  Tachycardi Rate and rhythm, no murmurs / rubs / gallops. S1 and S2 auscultated. Has some LE Edema Abdomen: Soft, Tender, Distended due to body habitus. No masses palpated. No appreciable hepatosplenomegaly. Bowel sounds positive x4.  GU: Deferred. Musculoskeletal: No clubbing / cyanosis of digits/nails. No joint deformity upper and lower extremities.  Skin: No rashes, lesions, ulcers on a limited skin evaluation. No induration; Warm and dry.  Neurologic: CN 2-12 grossly intact with no focal deficits. Romberg sign and cerebellar reflexes not assessed.  Psychiatric: Normal judgment and insight. Alert and oriented x 3. Anxious mood and appropriate affect.   Data Reviewed: I have personally reviewed following labs and imaging studies  CBC: Recent Labs  Lab 01/11/19 0608 01/12/19 0434 01/13/19 1217 01/14/19 0313 01/15/19 0200  WBC 8.5 6.6 6.4 5.9 5.7  NEUTROABS 7.1 5.3 4.8 4.4 4.1  HGB 10.8* 10.2* 10.6* 9.4* 9.5*  HCT 31.4* 30.5* 31.9* 29.1* 29.3*  MCV 95.4 95.6 98.5 99.0 99.0  PLT 17* 37* 23* 20* 78*   Basic Metabolic Panel: Recent Labs  Lab 01/11/19 0608 01/12/19 0434 01/13/19 1217 01/14/19 0313 01/15/19 0200  NA 140 136 139 138 140  K 3.4* 3.6 3.6 3.7 3.5  CL 102 101 104 104 107  CO2 26 25 24 25 25   GLUCOSE 147* 195* 152* 271* 152*  BUN 9 9 9 9 6   CREATININE 0.76 0.77 0.72 0.84 0.73  CALCIUM 8.1* 7.8* 7.9* 8.0* 8.2*  MG 1.5* 1.6* 1.7 1.7 1.5*  PHOS 2.0* 2.7 6.2* 2.9 2.1*   GFR: Estimated Creatinine Clearance: 115.6 mL/min (by C-G formula based on SCr of 0.73 mg/dL). Liver Function Tests: Recent Labs  Lab 01/11/19 0608 01/12/19 0434 01/13/19 1217 01/14/19 0313 01/15/19 0200  AST 217* 219* 148* 116* 76*  ALT 62* 76* 67* 59* 50*  ALKPHOS 192* 190* 236* 239* 249*  BILITOT 1.8* 0.9 1.0 0.4 0.4  PROT 6.7 6.3* 6.6 6.4* 6.7  ALBUMIN 2.6* 2.6* 2.4* 2.3* 2.4*   No results for input(s): LIPASE, AMYLASE in the last 168 hours. No results for input(s): AMMONIA in the  last 168 hours. Coagulation Profile: No results for input(s): INR, PROTIME in the last 168 hours. Cardiac Enzymes: No results for input(s): CKTOTAL, CKMB, CKMBINDEX, TROPONINI in the last 168 hours. BNP (last 3 results) No results for input(s): PROBNP in the last 8760 hours. HbA1C: No  results for input(s): HGBA1C in the last 72 hours. CBG: Recent Labs  Lab 01/15/19 0110 01/15/19 0509 01/15/19 0736 01/15/19 1124 01/15/19 1804  GLUCAP 164* 132* 132* 117* 96   Lipid Profile: No results for input(s): CHOL, HDL, LDLCALC, TRIG, CHOLHDL, LDLDIRECT in the last 72 hours. Thyroid Function Tests: No results for input(s): TSH, T4TOTAL, FREET4, T3FREE, THYROIDAB in the last 72 hours. Anemia Panel: No results for input(s): VITAMINB12, FOLATE, FERRITIN, TIBC, IRON, RETICCTPCT in the last 72 hours. Sepsis Labs: No results for input(s): PROCALCITON, LATICACIDVEN in the last 168 hours.  Recent Results (from the past 240 hour(s))  Culture, Urine     Status: Abnormal   Collection Time: 01/10/19  6:00 PM   Specimen: Urine, Random  Result Value Ref Range Status   Specimen Description   Final    URINE, RANDOM Performed at [facility] [address], [place], [place] [address]    Special Requests   Final    NONE Performed at [facility], [place] [address], [place], [place] [address]    Culture (A)  Final    80,000 COLONIES/mL ESCHERICHIA COLI Confirmed Extended Spectrum Beta-Lactamase Producer (ESBL).  In bloodstream infections from ESBL organisms, carbapenems are preferred over piperacillin/tazobactam. They are shown to have a lower risk of mortality.    Report Status 01/12/2019 FINAL  Final   Organism ID, Bacteria ESCHERICHIA COLI (A)  Final      Susceptibility   Escherichia coli - MIC*    AMPICILLIN >=32 RESISTANT Resistant     CEFAZOLIN >=64 RESISTANT Resistant     CEFTRIAXONE >=64 RESISTANT Resistant     CIPROFLOXACIN >=4 RESISTANT Resistant      GENTAMICIN <=1 SENSITIVE Sensitive     IMIPENEM <=0.25 SENSITIVE Sensitive     NITROFURANTOIN <=16 SENSITIVE Sensitive     TRIMETH/SULFA >=320 RESISTANT Resistant     AMPICILLIN/SULBACTAM 8 SENSITIVE Sensitive     PIP/TAZO <=4 SENSITIVE Sensitive     Extended ESBL POSITIVE Resistant     * 80,000 COLONIES/mL ESCHERICHIA COLI     RN Pressure Injury Documentation: Pressure Injury 11/16/18 Buttocks Right;Medial Stage II -  Partial thickness loss of dermis presenting as a shallow open ulcer with a red, pink wound bed without slough. right inner buttock (Active)  11/16/18 1815  Location: Buttocks  Location Orientation: Right;Medial  Staging: Stage II -  Partial thickness loss of dermis presenting as a shallow open ulcer with a red, pink wound bed without slough.  Wound Description (Comments): right inner buttock  Present on Admission: Yes     Estimated body mass index is 39.3 kg/m as calculated from the following:   Height as of this encounter: 5\' 9"  (1.753 m).   Weight as of this encounter: 120.7 kg.  Malnutrition Type:  Nutrition Problem: Increased nutrient needs Etiology: chronic illness, cancer and cancer related treatments   Malnutrition Characteristics:  Signs/Symptoms: estimated needs   Nutrition Interventions:  Interventions: Boost Breeze, Ensure Enlive (each supplement provides 350kcal and 20 grams of protein), MVI    Radiology Studies: Ct Chest W Contrast  Result Date: 01/14/2019 CLINICAL DATA:  Shortness of breath. Pleural effusion. History of stage IV lung cancer. EXAM: CT CHEST WITH CONTRAST TECHNIQUE: Multidetector CT imaging of the chest was performed during intravenous contrast administration. CONTRAST:  33mL OMNIPAQUE IOHEXOL 300 MG/ML  SOLN COMPARISON:  Plain film of earlier in the day. Chest CT 01/01/2019 reviewed. FINDINGS: Cardiovascular: Right-sided Port-A-Cath terminates at the superior caval/atrial junction. Aortic atherosclerosis.  Tortuous thoracic  aorta. Mild cardiomegaly, without pericardial effusion. Limited evaluation for pulmonary embolism on this nondedicated study. Mild degradation secondary to patient size and motion. Mediastinum/Nodes: Right paratracheal node measures 1.6 cm on 54/2. On the order of 2.1 cm on the prior exam (when remeasured). No left hilar adenopathy. Limited evaluation for right hilar adenopathy. Necrotic right axillary adenopathy, including at 1.9 x 2.6 cm on 57/2. Compare 2.0 x 3.6 cm on the prior exam. Retrocrural node of 1.5 cm on 132/2 versus 1.3 cm on the prior exam (when remeasured). Lungs/Pleura: Moderate right pleural effusion is similar to 01/01/2019 CT. A small left pleural effusion is new since that exam. Compression of right lower lobe bronchi. Mild subsegmental atelectasis at the left lung base. Inferior/posterior right upper, right middle, and posterior right lower lobe collapse/consolidation. Upper Abdomen: Normal imaged portions of the liver, spleen, stomach, pancreas, adrenal glands, kidneys. Motion degradation continuing into the upper abdomen. The gallbladder appears heterogeneous, at least partially due to motion. Musculoskeletal: Moderate thoracic spondylosis. IMPRESSION: 1. Degradation secondary to motion and patient body habitus. 2. Moderate right pleural effusion with multifocal right-sided dependent pulmonary opacities, favored to represent compressive atelectasis. 3. Redemonstration of thoracic adenopathy. Improved in the right axillary and right paratracheal stations, increased in the retrocrural space. 4. Apparent heterogeneity of the gallbladder, likely due to motion degradation. If there are right upper quadrant symptoms., Correlate with ultrasound. 5. Aortic Atherosclerosis (ICD10-I70.0). Electronically Signed   By: Abigail Miyamoto M.D.   On: 01/14/2019 18:48   US Renal  Result Date: 01/13/2019 CLINICAL DATA:  Hematuria EXAM: RENAL / URINARY TRACT ULTRASOUND COMPLETE COMPARISON:  CT dated 01/04/2019  FINDINGS: Right Kidney: Renal measurements: 11.9 x 4.5 x 6 cm = volume: 170 mL . Echogenicity within normal limits. No mass or hydronephrosis visualized. Left Kidney: Renal measurements: 11.4 x 6.6 x 6.4 cm = volume: 252 mL. Echogenicity within normal limits. No mass or hydronephrosis visualized. Bladder: Appears normal for degree of bladder distention. IMPRESSION: No acute abnormality detected. No finding to explain the patient's hematuria. Electronically Signed   By: Constance Holster M.D.   On: 01/13/2019 22:52   Dg Chest Port 1 View  Result Date: 01/14/2019 CLINICAL DATA:  Cough, SOB - past medical history significant for metastatic adenocarcinoma, nonischemic cardiomyopathy, hypertension, hypothyroidism, history of malignant pleural effusion status post thoracentesis x3 EXAM: PORTABLE CHEST 1 VIEW COMPARISON:  Chest radiograph 01/12/2019 FINDINGS: Stable cardiomediastinal contours. There is new complete opacification of the right hemithorax likely secondary to effusion and associated atelectasis. The previously seen spiculated right perihilar mass is poorly visualized due to the opacification of the adjacent lung. The left lung is clear. No pneumothorax. Right chest port remains in place. IMPRESSION: 1. Complete opacification of the right hemithorax likely secondary to reaccumulated effusion and secondary atelectasis of the adjacent lung. 2. The previously seen spiculated right perihilar mass is poorly visualized due to the adjacent lung opacification. These results were called by telephone at the time of interpretation on 01/14/2019 at 2:28 pm to provider [facility] , who verbally acknowledged these results. Electronically Signed   By: Audie Pinto M.D.   On: 01/14/2019 14:28     Scheduled Meds:  sodium chloride   Intravenous Once   atorvastatin  40 mg Oral q1800   Chlorhexidine Gluconate Cloth  6 each Topical Daily   docusate sodium  100 mg Oral BID   DULoxetine  60 mg Oral Daily    feeding supplement  1 Container Oral BID BM  feeding supplement (ENSURE ENLIVE)  237 mL Oral BID BM   folic acid  1 mg Oral Daily   insulin aspart  0-9 Units Subcutaneous Q4H   insulin glargine  20 Units Subcutaneous QAC breakfast   levothyroxine  150 mcg Oral QAC breakfast   mouth rinse  15 mL Mouth Rinse BID   metoprolol succinate  50 mg Oral QPM   metoprolol succinate  75 mg Oral Daily   multivitamin with minerals  1 tablet Oral Daily   pantoprazole  40 mg Oral Daily   phosphorus  500 mg Oral BID   polyethylene glycol  17 g Oral BID   senna-docusate  2 tablet Oral BID   vitamin B-12  1,000 mcg Oral Daily   Continuous Infusions:  sodium chloride     sodium chloride 250 mL (01/14/19 0156)   [START ON 01/16/2019]  ceFAZolin (ANCEF) IV     meropenem (MERREM) IV 1 g (01/15/19 1342)    LOS: 13 days    Kerney Elbe, DO [facility] PAGER is on AMION  If 7PM-7AM, please contact night-coverage [email or web address] Password [email or web address] 01/15/2019, 7:06 PM

## 2019-01-15 NOTE — Progress Notes (Signed)
Patient refusing oral meds at this time stating she is in pain and will try later. IV pain medication given

## 2019-01-15 NOTE — Progress Notes (Signed)
PT Cancellation Note  Patient Details Name: Marilyn [name] MRN: [id number] DOB: 01-30-1966   Cancelled Treatment:    Reason Eval/Treat Not Completed: Patient at procedure or test/unavailable NP in with pt.  Pt with possible pleurx placement tomorrow.   [name],KATHrine E 01/15/2019, 2:31 PM [name], PT, DPT Acute Rehabilitation Services Office: [phone] Pager: [phone]

## 2019-01-15 NOTE — Care Management Important Message (Signed)
Important Message  Patient Details IM Letter given to [name] RN to present to the Patient    Name: Marilyn [name] MRN: [id number] Date of Birth: 03/10/1966   Medicare Important Message Given:  Yes     Kerin Salen 01/15/2019, 11:33 AM

## 2019-01-15 NOTE — Consult Note (Addendum)
Name: Marilyn [name] MRN: [id number] DOB: 01/20/1966    ADMISSION DATE:  01/01/2019 CONSULTATION DATE:  01/15/2019  REFERRING MD :  Alfredia Ferguson, [facility]ad MD  CHIEF COMPLAINT: Dyspnea, abdominal pain  HISTORY OF PRESENT ILLNESS: 53 year old with metastatic lung adenocarcinoma and right malignant pleural effusion status thoracentesis x3.  We are consulted for recurrence of moderate right effusion and atelectasis of the right lung.  She was admitted 9/28 for abdominal pain, imaging including CT/ultrasound and HIDA scan suggesting chronic cholecystitis, not considered to be a surgical candidate and IR drain could not be placed hence treated medically with antibiotics.  Hospital course been complicated by ESBL E. coli UTI ( 80k )  Chest x-ray 10/11 personally reviewed which showed complete opacification of the right hemithorax due to reaccumulated pleural effusion Chest x-ray from 10/9 showed right perihilar mass with small bilateral effusions  Ultrasound-guided thoracentesis was again attempted but not much pleural fluid could be visualized by radiology PA hence CT chest was performed which showed moderate right effusion and atelectasis of the right lung  Oncology notes from 9/23 were reviewed-never smoker presented with stage IIIb on diagnosis with large right paratracheal mass with mediastinal lymphadenopathy and right supraclavicular lymphadenopathy, high PDL 1 expression 90% with negative EGFR mutation, completed initial chemo with carbo Taxol in 2017, she had disease progression in 2018 with subcutaneous abdominal metastases which was treated with palliative RT and also received immunotherapy, but had disease progression on this, hence is on chemotherapy with Alimta and Avastin last such treatment on 9/23   SIGNIFICANT EVENTS    STUDIES:  9/7 RT thoracentesis >> 400 cc 9/29 RT thoracenteses -920 cc, positive adenocarcinoma 10/11 CT chest Moderate right pleural effusion with multifocal  right-sided dependent pulmonary opacities, favored to represent compressive atelectasis.  Redemonstration of thoracic adenopathy     PAST MEDICAL HISTORY :   has a past medical history of Arthritis, Asthma, Diabetes mellitus, Hemorrhoids, Hypercholesteremia, Hypertension, Hypothyroidism, Metastatic lung cancer (metastasis from lung to other site) [facility]), Neuropathy, NICM (nonischemic cardiomyopathy) ([place]), Obese, Pneumonia (2017), and Sinus tachycardia.  has a past surgical history that includes Cesarean section; Tubal ligation; US guided core needle biopsy; ir generic historical (01/08/2016); ir generic historical (01/08/2016); IR FLUORO GUIDE PORT INSERTION RIGHT (02/18/2017); IR US Guide Vasc Access Right (02/18/2017); I/D  arm (Right, 2013); Colonoscopy; Multiple extractions with alveoloplasty (N/A, 03/25/2017); and IR THORACENTESIS ASP PLEURAL SPACE W/IMG GUIDE (05/25/2018). Prior to Admission medications   Medication Sig Start Date End Date Taking? Authorizing Provider  ASPIRIN LOW DOSE 81 MG EC tablet TAKE 1 TABLET BY MOUTH EVERY DAY Patient taking differently: Take 81 mg by mouth daily. Enteric Coated.  ** DO NOT CRUSH ** 03/19/18  Yes [name], MD  atorvastatin (LIPITOR) 40 MG tablet Take 1 tablet (40 mg total) by mouth daily at 6 PM. 04/30/18  Yes Cristal Deer, MD  dexamethasone (DECADRON) 4 MG tablet Take 1 tab two times a day the day before Alimta chemo. Take 2 tabs two times a day starting the day after chemo for 3 days. Patient taking differently: Take 4-8 mg by mouth See admin instructions. Take 4 mg twice daily the day before Alimta chemo, then take 8 mg twice daily starting the day after chemo for 3 days. 09/20/18  Yes [name], MD  diclofenac sodium (VOLTAREN) 1 % GEL Apply 2 g topically 2 (two) times daily as needed (for chestwall pain from costochondiritis). 12/11/18  Yes [name], MD  DULoxetine ([place])  60 MG capsule TAKE 1 CAPSULE BY MOUTH  EVERY DAY Patient taking differently: Take 60 mg by mouth daily.  12/13/17  Yes [name], MD  feeding supplement, ENSURE ENLIVE, (ENSURE ENLIVE) LIQD Take 237 mLs by mouth 2 (two) times daily between meals. 12/12/18  Yes Mariel Aloe, MD  folic acid (FOLVITE) 1 MG tablet Take 1 tablet (1 mg total) by mouth daily. Start 5-7 days before Alimta chemotherapy. Continue until 21 days after Alimta completed. 09/20/18  Yes [name], MD  hydrOXYzine (ATARAX/VISTARIL) 10 MG tablet Take 1 tablet (10 mg total) by mouth 3 (three) times daily as needed for itching. 04/26/18  Yes [name], MD  insulin aspart (NOVOLOG) 100 UNIT/ML FlexPen As per sliding scale instructions Patient taking differently: Inject 0-10 Units into the skin 3 (three) times daily. As per sliding scale instructions 01/16/16  Yes [name], MD  Insulin Degludec (TRESIBA FLEXTOUCH) 200 UNIT/ML SOPN Inject 100 Units into the skin daily before breakfast.    Yes [provider]  levalbuterol (XOPENEX HFA) 45 MCG/ACT inhaler Inhale 1-2 puffs into the lungs every 8 (eight) hours as needed for wheezing. 04/03/18  Yes Causey, Charlestine Massed, NP  levothyroxine (SYNTHROID) 150 MCG tablet TAKE 1 TABLET BY MOUTH EVERY DAY BEFORE BREAKFAST Patient taking differently: Take 150 mcg by mouth daily before breakfast.  10/26/18  Yes [name], MD  lidocaine-prilocaine (EMLA) cream Apply 1 application topically as needed. Patient taking differently: Apply 1 application topically as needed (port access).  08/23/18  Yes [name], MD  LORazepam (ATIVAN) 0.5 MG tablet Take 1 tablet (0.5 mg total) by mouth every 6 (six) hours as needed (Nausea or vomiting). 10/25/18  Yes [name], MD  magnesium oxide (MAG-OX) 400 (241.3 Mg) MG tablet Take 1 tablet (400 mg total) by mouth 2 (two) times daily. 10/23/18  Yes [name], MD  metoprolol succinate (TOPROL-XL) 50 MG 24 hr tablet  Take 1 tablet (30m) by mouth in the morning and 1/2 tablet (271m in the evening. Take with or immediately following a meal. Patient taking differently: Take 25-50 mg by mouth 2 (two) times daily. Take 5041my mouth in the morning and 45m9m the evening. Take with or immediately following a meal. 08/10/18  Yes Meng, Hao,Trosky  ondansetron (ZOFRAN) 8 MG tablet Take 1 tablet (8 mg total) by mouth 2 (two) times daily as needed for refractory nausea / vomiting. Start on day 3 after chemo. 09/20/18  Yes KaleBrunetta Genera  oxyCODONE (OXY IR/ROXICODONE) 5 MG immediate release tablet Take 1 tablet (5 mg total) by mouth every 4 (four) hours as needed for severe pain. 11/17/18  Yes ChiuDonne Hazel  pantoprazole (PROTONIX) 40 MG tablet Take 1 tablet (40 mg total) by mouth daily. 12/11/18  Yes KaleBrunetta Genera  senna-docusate (SENNA S) 8.6-50 MG tablet Take 2 tablets by mouth at bedtime. 12/11/18  Yes KaleBrunetta Genera  Vitamins A & D (VITAMIN A & D) OINT Apply 1 application topically 2 (two) times daily. Patient taking differently: Apply 1 application topically 2 (two) times daily as needed (itching).  03/06/18  Yes KaleBrunetta Genera  ACCU-CHEK AVIVA PLUS test strip 1 each by Other route 3 (three) times daily.  06/15/18   [provider]  ertapenem (INVANZ) IVPB Inject 1 g into the vein daily for 5 days. Indication: ESBL E Coli complicated UTI  Last Day of Therapy:  01/18/2019 Labs - Once weekly:  CBC/D and BMP, Labs - Every other week:  ESR and CRP 01/14/19 01/19/19  Sheikh, Georgina Quint Latif, DO  glucose monitoring kit (FREESTYLE) monitoring kit 1 each by Does not apply route as needed for other. 11/05/18   [name], MD   No Known Allergies  FAMILY HISTORY:  family history includes Heart failure in her mother; Hypertension in her mother. SOCIAL HISTORY:  reports that she has never smoked. She has never used smokeless tobacco. She reports that she does not drink alcohol  or use drugs.  REVIEW OF SYSTEMS: "Every day is a different problem" Constitutional: Negative for fever, chills, positive for weight loss, malaise/fatigue and diaphoresis.  HENT: Negative for hearing loss, ear pain, nosebleeds, congestion, sore throat, neck pain, tinnitus and ear discharge.   Eyes: Negative for blurred vision, double vision, photophobia, pain, discharge and redness.  Respiratory: Negative for cough, hemoptysis, sputum production, wheezing and stridor.   Cardiovascular: Negative for  palpitations, orthopnea, claudication, leg swelling and PND. Complains of right-sided chest pain and dyspnea Gastrointestinal: Negative for heartburn, nausea, vomiting,  diarrhea, constipation, blood in stool and melena. Positive for right-sided abdominal pain which is now resolved and complains of left-sided abdominal pain today Genitourinary: Negative for dysuria, urgency, frequency, hematuria and flank pain.  Musculoskeletal: Negative for myalgias, back pain, joint pain and falls.  Skin: Negative for itching and rash.  Neurological: Negative for dizziness, tingling, tremors, sensory change, speech change, focal weakness, seizures, loss of consciousness, weakness and headaches.  Endo/Heme/Allergies: Negative for environmental allergies and polydipsia. Does not bruise/bleed easily.  SUBJECTIVE:   VITAL SIGNS: Temp:  [97.2 F (36.2 C)-98.3 F (36.8 C)] 98 F (36.7 C) (10/12 0901) Pulse Rate:  [108-118] 114 (10/12 0901) Resp:  [16-20] 20 (10/12 0901) BP: (145-186)/(90-110) 186/110 (10/12 0901) SpO2:  [93 %-98 %] 95 % (10/12 0901)  PHYSICAL EXAMINATION: Gen. Pleasant, obese, in no distress, normal affect, lying prone Skin -Hyperpigmentation of face and arms and fingers ENT - no pallor,icterus, no post nasal drip, class 2-3 airway Neck: No JVD, no thyromegaly, no carotid bruits Lungs: no use of accessory muscles, RT dullness to percussion, decreased on right without rales or rhonchi    Cardiovascular: Rhythm regular, heart sounds  normal, no murmurs or gallops, no peripheral edema Abdomen: soft and non-tender, no hepatosplenomegaly, BS normal. Musculoskeletal: No deformities, no cyanosis or clubbing Neuro:  alert, non focal, no tremors   Recent Labs  Lab 01/13/19 1217 01/14/19 0313 01/15/19 0200  NA 139 138 140  K 3.6 3.7 3.5  CL 104 104 107  CO2 _0 BUN _1 CREATININE 0.72 0.84 0.73  GLUCOSE 152* 271* 152*   Recent Labs  Lab 01/13/19 1217 01/14/19 0313 01/15/19 0200  HGB 10.6* 9.4* 9.5*  HCT 31.9* 29.1* 29.3*  WBC 6.4 5.9 5.7  PLT 23* 20* 78*   Ct Chest W Contrast  Result Date: 01/14/2019 CLINICAL DATA:  Shortness of breath. Pleural effusion. History of stage IV lung cancer. EXAM: CT CHEST WITH CONTRAST TECHNIQUE: Multidetector CT imaging of the chest was performed during intravenous contrast administration. CONTRAST:  58m OMNIPAQUE IOHEXOL 300 MG/ML  SOLN COMPARISON:  Plain film of earlier in the day. Chest CT 01/01/2019 reviewed. FINDINGS: Cardiovascular: Right-sided Port-A-Cath terminates at the superior caval/atrial junction. Aortic atherosclerosis. Tortuous thoracic aorta. Mild cardiomegaly, without pericardial effusion. Limited evaluation for pulmonary embolism on this nondedicated study. Mild degradation secondary to patient size and motion. Mediastinum/Nodes: Right paratracheal  node measures 1.6 cm on 54/2. On the order of 2.1 cm on the prior exam (when remeasured). No left hilar adenopathy. Limited evaluation for right hilar adenopathy. Necrotic right axillary adenopathy, including at 1.9 x 2.6 cm on 57/2. Compare 2.0 x 3.6 cm on the prior exam. Retrocrural node of 1.5 cm on 132/2 versus 1.3 cm on the prior exam (when remeasured). Lungs/Pleura: Moderate right pleural effusion is similar to 01/01/2019 CT. A small left pleural effusion is new since that exam. Compression of right lower lobe bronchi. Mild subsegmental atelectasis at the left lung  base. Inferior/posterior right upper, right middle, and posterior right lower lobe collapse/consolidation. Upper Abdomen: Normal imaged portions of the liver, spleen, stomach, pancreas, adrenal glands, kidneys. Motion degradation continuing into the upper abdomen. The gallbladder appears heterogeneous, at least partially due to motion. Musculoskeletal: Moderate thoracic spondylosis. IMPRESSION: 1. Degradation secondary to motion and patient body habitus. 2. Moderate right pleural effusion with multifocal right-sided dependent pulmonary opacities, favored to represent compressive atelectasis. 3. Redemonstration of thoracic adenopathy. Improved in the right axillary and right paratracheal stations, increased in the retrocrural space. 4. Apparent heterogeneity of the gallbladder, likely due to motion degradation. If there are right upper quadrant symptoms., Correlate with ultrasound. 5. Aortic Atherosclerosis (ICD10-I70.0). Electronically Signed   By: Abigail Miyamoto M.D.   On: 01/14/2019 18:48   US Renal  Result Date: 01/13/2019 CLINICAL DATA:  Hematuria EXAM: RENAL / URINARY TRACT ULTRASOUND COMPLETE COMPARISON:  CT dated 01/04/2019 FINDINGS: Right Kidney: Renal measurements: 11.9 x 4.5 x 6 cm = volume: 170 mL . Echogenicity within normal limits. No mass or hydronephrosis visualized. Left Kidney: Renal measurements: 11.4 x 6.6 x 6.4 cm = volume: 252 mL. Echogenicity within normal limits. No mass or hydronephrosis visualized. Bladder: Appears normal for degree of bladder distention. IMPRESSION: No acute abnormality detected. No finding to explain the patient's hematuria. Electronically Signed   By: Constance Holster M.D.   On: 01/13/2019 22:52   Dg Chest Port 1 View  Result Date: 01/14/2019 CLINICAL DATA:  Cough, SOB - past medical history significant for metastatic adenocarcinoma, nonischemic cardiomyopathy, hypertension, hypothyroidism, history of malignant pleural effusion status post thoracentesis x3 EXAM:  PORTABLE CHEST 1 VIEW COMPARISON:  Chest radiograph 01/12/2019 FINDINGS: Stable cardiomediastinal contours. There is new complete opacification of the right hemithorax likely secondary to effusion and associated atelectasis. The previously seen spiculated right perihilar mass is poorly visualized due to the opacification of the adjacent lung. The left lung is clear. No pneumothorax. Right chest port remains in place. IMPRESSION: 1. Complete opacification of the right hemithorax likely secondary to reaccumulated effusion and secondary atelectasis of the adjacent lung. 2. The previously seen spiculated right perihilar mass is poorly visualized due to the adjacent lung opacification. These results were called by telephone at the time of interpretation on 01/14/2019 at 2:28 pm to provider [facility] , who verbally acknowledged these results. Electronically Signed   By: Audie Pinto M.D.   On: 01/14/2019 14:28    ASSESSMENT / PLAN:  Metastatic lung adenocarcinoma Malignant right effusion, recurrent-  Right atelectasis  Reviewed CT personally, no evidence of endobronchial lesion causing atelectasis.  Reasonable to place Pleurx catheter here as a palliative measure.  Recommend-plan for Pleurx catheter 10/13 Discussed procedure with the patient and explained that this would be a palliative measure.  Discussed risks and benefits of such a catheter, emphasizing that this would not cure her malignancy but hopefully prevent recurrent hospitalization due to recurrent effusion  Thrombocytopenia is  a concern, repeat platelet count tomorrow a.m. and if <50k, may need transfusion prior to procedure  Kara Mead MD. FCCP. [facility] Pager [phone] If no response call [phone]     01/15/2019, 10:56 AM

## 2019-01-16 ENCOUNTER — Inpatient Hospital Stay (HOSPITAL_COMMUNITY): Payer: Medicare Other

## 2019-01-16 DIAGNOSIS — C3492 Malignant neoplasm of unspecified part of left bronchus or lung: Secondary | ICD-10-CM

## 2019-01-16 LAB — COMPREHENSIVE METABOLIC PANEL
ALT: 42 U/L (ref 0–44)
AST: 58 U/L — ABNORMAL HIGH (ref 15–41)
Albumin: 2.4 g/dL — ABNORMAL LOW (ref 3.5–5.0)
Alkaline Phosphatase: 245 U/L — ABNORMAL HIGH (ref 38–126)
Anion gap: 9 (ref 5–15)
BUN: 7 mg/dL (ref 6–20)
CO2: 24 mmol/L (ref 22–32)
Calcium: 8.2 mg/dL — ABNORMAL LOW (ref 8.9–10.3)
Chloride: 108 mmol/L (ref 98–111)
Creatinine, Ser: 0.73 mg/dL (ref 0.44–1.00)
GFR calc Af Amer: >60 mL/min (ref >60)
GFR calc non Af Amer: >60 mL/min (ref >60)
Glucose, Bld: 152 mg/dL — ABNORMAL HIGH (ref 70–99)
Potassium: 3.6 mmol/L (ref 3.5–5.1)
Sodium: 141 mmol/L (ref 135–145)
Total Bilirubin: 0.4 mg/dL (ref 0.3–1.2)
Total Protein: 6.5 g/dL (ref 6.5–8.1)

## 2019-01-16 LAB — BPAM PLATELET PHERESIS
Blood Product Expiration Date: 202010122359
ISSUE DATE / TIME: 202010120237
Unit Type and Rh: 600

## 2019-01-16 LAB — GLUCOSE, CAPILLARY
Glucose-Capillary: 140 mg/dL — ABNORMAL HIGH (ref 70–99)
Glucose-Capillary: 154 mg/dL — ABNORMAL HIGH (ref 70–99)
Glucose-Capillary: 158 mg/dL — ABNORMAL HIGH (ref 70–99)
Glucose-Capillary: 161 mg/dL — ABNORMAL HIGH (ref 70–99)
Glucose-Capillary: 170 mg/dL — ABNORMAL HIGH (ref 70–99)
Glucose-Capillary: 176 mg/dL — ABNORMAL HIGH (ref 70–99)

## 2019-01-16 LAB — CBC WITH DIFFERENTIAL/PLATELET
Abs Immature Granulocytes: 0.05 10*3/uL (ref 0.00–0.07)
Basophils Absolute: 0 10*3/uL (ref 0.0–0.1)
Basophils Relative: 0 %
Eosinophils Absolute: 0.1 10*3/uL (ref 0.0–0.5)
Eosinophils Relative: 1 %
HCT: 30.3 % — ABNORMAL LOW (ref 36.0–46.0)
Hemoglobin: 9.8 g/dL — ABNORMAL LOW (ref 12.0–15.0)
Immature Granulocytes: 1 %
Lymphocytes Relative: 11 %
Lymphs Abs: 0.7 10*3/uL (ref 0.7–4.0)
MCH: 32.3 pg (ref 26.0–34.0)
MCHC: 32.3 g/dL (ref 30.0–36.0)
MCV: 100 fL (ref 80.0–100.0)
Monocytes Absolute: 0.7 10*3/uL (ref 0.1–1.0)
Monocytes Relative: 12 %
Neutro Abs: 4.5 10*3/uL (ref 1.7–7.7)
Neutrophils Relative %: 75 %
Platelets: 91 10*3/uL — ABNORMAL LOW (ref 150–400)
RBC: 3.03 MIL/uL — ABNORMAL LOW (ref 3.87–5.11)
RDW: 17.4 % — ABNORMAL HIGH (ref 11.5–15.5)
WBC: 6 10*3/uL (ref 4.0–10.5)
nRBC: 0 % (ref 0.0–0.2)

## 2019-01-16 LAB — PHOSPHORUS: Phosphorus: 2.3 mg/dL — ABNORMAL LOW (ref 2.5–4.6)

## 2019-01-16 LAB — MAGNESIUM: Magnesium: 1.7 mg/dL (ref 1.7–2.4)

## 2019-01-16 LAB — PREPARE PLATELET PHERESIS: Unit division: 0

## 2019-01-16 MED ORDER — MIDAZOLAM HCL 2 MG/2ML IJ SOLN
2.0000 mg | Freq: Once | INTRAMUSCULAR | Status: AC
  Administered 2019-01-16: 2 mg via INTRAVENOUS
  Filled 2019-01-16: qty 2

## 2019-01-16 MED ORDER — PRO-STAT SUGAR FREE PO LIQD
30.0000 mL | Freq: Two times a day (BID) | ORAL | Status: DC
  Administered 2019-01-16 – 2019-01-18 (×3): 30 mL via ORAL
  Filled 2019-01-16 (×3): qty 30

## 2019-01-16 MED ORDER — LABETALOL HCL 5 MG/ML IV SOLN
10.0000 mg | INTRAVENOUS | Status: DC | PRN
  Administered 2019-01-16: 10 mg via INTRAVENOUS
  Filled 2019-01-16 (×2): qty 4

## 2019-01-16 MED ORDER — ONDANSETRON HCL 4 MG PO TABS
4.0000 mg | ORAL_TABLET | Freq: Four times a day (QID) | ORAL | Status: AC
  Administered 2019-01-16 – 2019-01-17 (×4): 4 mg via ORAL
  Filled 2019-01-16 (×4): qty 1

## 2019-01-16 MED ORDER — FENTANYL CITRATE (PF) 100 MCG/2ML IJ SOLN
100.0000 ug | Freq: Once | INTRAMUSCULAR | Status: AC
  Administered 2019-01-16: 100 ug via INTRAVENOUS

## 2019-01-16 MED ORDER — BOOST / RESOURCE BREEZE PO LIQD CUSTOM
1.0000 | ORAL | Status: DC
  Administered 2019-01-17 – 2019-01-18 (×2): 1 via ORAL

## 2019-01-16 MED ORDER — POTASSIUM PHOSPHATES 15 MMOLE/5ML IV SOLN
10.0000 mmol | Freq: Once | INTRAVENOUS | Status: AC
  Administered 2019-01-16: 10 mmol via INTRAVENOUS
  Filled 2019-01-16: qty 3.33

## 2019-01-16 MED ORDER — FENTANYL CITRATE (PF) 100 MCG/2ML IJ SOLN
INTRAMUSCULAR | Status: AC
  Filled 2019-01-16: qty 2

## 2019-01-16 MED ORDER — K PHOS MONO-SOD PHOS DI & MONO 155-852-130 MG PO TABS
500.0000 mg | ORAL_TABLET | Freq: Two times a day (BID) | ORAL | Status: AC
  Administered 2019-01-16: 500 mg via ORAL
  Filled 2019-01-16 (×2): qty 2

## 2019-01-16 MED ORDER — ONDANSETRON HCL 4 MG/2ML IJ SOLN
4.0000 mg | Freq: Four times a day (QID) | INTRAMUSCULAR | Status: AC
  Administered 2019-01-17: 4 mg via INTRAVENOUS
  Filled 2019-01-16 (×2): qty 2

## 2019-01-16 NOTE — Progress Notes (Signed)
PROGRESS NOTE    Marilyn [name]  ZOX:[id number] DOB: Nov 13, 1965 DOA: 01/01/2019 PCP: [name], FNP  Brief Narrative:  The patient is a 53 year old morbidly obese African-American female with a past medical history significant for metastatic adenocarcinoma, nonischemic cardiomyopathy, hypertension, hypothyroidism, history of malignant pleural effusion status post thoracentesis x3, hypothyroidism as well as other comorbidities who presented with a chief complaint of right-sided abdominal pain.  She reported the pain was going on for the last 3 months for last 3 days and acutely worsened.  She is currently on chemotherapy and her last infusion was on 12/27/2018 with Avastin, Alimta, carboplatin.  Initial CT and ultrasound noted gallbladder wall thickening, gallstones and HIDA scan was done which noted chronic cholecystitis.  Situation was complicated by neutropenia from chemotherapy.  General surgery evaluated and recommended she is a poor surgical candidate given her metastatic cancer recommended medical therapy as well as continue antibiotics for now and follow-up in outpatient setting.  They recommend advancing diet as tolerated and IR was unable to drain her gallbladder due to being contracted general surgery recommended symptomatic treatment course of antibiotics and follow-up as an outpatient to discuss elective cholecystectomy when she is completed chemotherapy as needed.  Hospitalization has been complicated by constipation and severe thrombocytopenia and now some hematuria which is worsening.  **Interim History on 01/10/2019 General surgery has signed off and PT OT not able to be done as patient was feeling a little bit dizzy and nauseous and has been refusing.  Hemoglobin hematocrit is trending down and will obtain a urinalysis and urine culture given her hematuria now and showed and ESBL E Coli  Patient recently received subcu heparin which is now been stopped.  She was transfused PRBCs  for low platelets due to her worsening hemoglobin and platelet count patient felt a little bit better today so her diet was advanced to soft diet.  Platelet count is improved but continues to trend well.  Urinalysis showed 80,000 colonies of ESBL E. coli so antibiotics were changed from ciprofloxacin and are now IV meropenem.  OPAT orders were initiated to see if the patient can get IV ertapenem in outpatient setting awaiting case management input.  If patient cannot get ertapenem she will continue to stay in the hospital and get treatment for a 5-day course of antibiotic coverage.  This morning's x-ray showed complete opacification and whiteout of her right lung and likely secondary to recurrent pleural effusion.  Patient remains sinus tachycardic and I have asked the IR PA to evaluate and perform a thoracentesis however could not be safely done.  We will will also get a CT of the chest with contrast  Which showed Moderate Effusion and compressive Atlectasis.  Pulmonary evaluated CT was reviewed and Dr. Festus Holts feels that a Pleurx catheter will be helpful as a palliative measure and they are planning on placing this on 01/16/2019.   She continues bleeding and patient described as vaginal bleeding and having a "menses."  However she cannot really tell if this was coming from her urine or not.  I spoke with urology Dr. Louis Meckel who recommends continued observation and recommending no Foley catheter placement at this time as she is draining well and recommends continued observation given her renal function is not elevated because her blood count is relatively stable.  Assessment & Plan:   Active Problems:   Diabetes mellitus without complication ([place])   Adenocarcinoma of left lung, stage 3 ([place])   Port catheter in place   Sinus  tachycardia   Hypothyroidism   Non-ischemic cardiomyopathy (HCC)   Essential hypertension   HLD (hyperlipidemia)   Depression   Chronic diastolic CHF (congestive heart failure)  (HCC)   Recurrent right pleural effusion   Metastatic adenocarcinoma to lung, right (HCC)   Cholecystitis, acute   Biliary colic   Chemotherapy-induced neutropenia (HCC)   Urinary tract infection due to extended-spectrum beta lactamase (ESBL) producing Escherichia coli  Acute on chronic cholecystitis, stable and slightly improved -Admitted with severe right upper quadrant abdominal pain, worsening from baseline Initial imaging studies with CT and ultrasound noted gallbladder wall thickening, gallstones, HIDA scan showed evidence of chronic cholecystitis   -Treated initially with bowel rest, IV Zosyn, complicated by neutropenia from recent chemotherapy -Repeat CT abdomen pelvis 10/1 without acute findings, gallbladder wall thickening appears to have improved,  -Pain appears to be reportedly a little better, continues to have some nausea today unable to eat or drink very much -Appreciate general surgery input, she is not a surgical candidate given metastatic cancer, recent Chemotherapy especially Avastin on 9/23 -Completed 6 days of empiric Zosyn, given worsening thrombocytopenia changed to IV Cipro and Flagyl yesterday 10/5 and will change to po for a total of 14-day length of treatment however patient antibiotics will be escalated to IV meropenem given her ESBL E. coli UTI; Attempt has been made to get Ertapenem for D/C -Encouraged liquid diet as yesterday tolerated, wean IV narcotics; ADAT and now diet was advanced to a soft diet -LFTs are elevated in the setting of chronic cholecystitis and AST was 219 and ALT is 76 but they are trending down; AST is now 58 and ALT is 42 -Discussed with IR last week, PERC drain not an option since her gallbladder is contracted -Continue laxative, out of bed, Dulcolax suppository  -Ambulate and OOB -PT OT to evaluate and treat and they are recommending home health PT versus SNF but patient wants to go home  Malignant recurrent right pleural effusion  -Patient has metastatic adenocarcinoma of the lung and has recurrent right malignant effusion -Pt is s/p thoracentesis 9/29, yielding 920cc fluid, cytology positive for malignant effusion -Follow-up intermittent x-rays as needed and repeat chest x-ray on 10/7/20202 showed unchanged right suprahilar consolidation and atelectasis and right pleural effusion which was associated with atelectasis has decreased to 01/04/2019 no evidence of pneumothorax -CXR dau before yesterday showed "Complete opacification of the right hemithorax likely secondary to reaccumulated effusion and secondary atelectasis of the adjacent lung.The previously seen spiculated right perihilar mass is poorly visualized due to the adjacent lung opacification" -Obtain CT of the Chest w/ Contrast and showed a moderate right pleural effusion with compressive atelectasis -Will obtain a Thoracentesis however unfortunately cannot be safely done -Discussed with pulmonary Dr. Elsworth Soho  who evaluated and reviewed CT scan personally and recommends Pleurx catheter placement done and will be done today   Metastatic Non-small Cell Lung Cancer. -Followed by Dr. Irene Limbo -Currently on chemotherapy with carboplatin, Alimta and Avastin. Last cycle on 12/27/2018 -Oncology following and appreciate further evaluation and Recc's -CT as Above and Pleurx catheter to be placed -Pain control but patient has been refusing Morphine   Thrombocytopenia  -Due to sepsis, chemotherapy, worsened by recent heparin use; Now platelet Count has been dropping but now improved to 91,000 -Heparin discontinued 10/3 -Oncology following -HIT antibody panel is negative, SRA Negative  -My Partener Dr. Josephina Shih and discussed with patient's oncologist, Dr.Kal and, he feels that lingering effects from recent chemotherapy could be contributing to her worsening thrombocytopenia  as well and beta-lactam's, Zosyn potentially also worsening this, hence stopped IV PIP tazo, started  Cipro Flagyl but escalated to IV Meropenem given ESBL E Coli UTI -Checked immature platelet fraction is within normal range, -Transfused 3 unit of platelets during hospitalization; Has some Hematuria from UTI and Blood is still red somewhat  -Continue to Monitor CBC Carefully   Macrocyctic Anemia/ABLA from Hematuria -Worsening Hgb/Hct and trended down to 7.2/21.6 yesterday -Type and Screen and Transfuse 2 units of pRBC's; Repeat CBC this AM showed Hgb/Hct to be 9.8/30.3 and stable -In the setting of Hematuria and recent Chemo as well as sq Heparin use -Continue to Monitor for S/Sx of Bleeding; Having hematuria per nurse and will obtain a UA and Urine Cx and that showed infection -May need to discuss with Urology if not improving  -Repeat CBC in AM   Depression. -Continue on Cymbalta  Acute on Chronic Diastolic Dysfunction. -Prior history of nonischemic cardiomyopathy showed normal EF -Given Lasix few days ago, monitor, dose periodically -Will give 2 units and IV 40 mg Lasix in between -Strict I's/O's and Daily Weights -She is +1322 mL since admission  -Continue to Monitor for S/Sx of Volume Overload -Repeat CXR as above   Hypokalemia -Patient's K+ was 3.6 -Continue to Monitor and Replete as necessary -Repeat CMP in AM  Hypophosphatemia -Patient's Phos Level was 6.2 the day before yesterday however repeat this morning was 2.3 -Replete with IV K Phos -Continue to Monitor and Replete as Necessary -Repeat Phos Level in AM   Hypomagnesemia  -Patient's Mag Level yesterday AM was 1.7 -Replete with IV magnesium sulfate 2 g -Continue to Monitor and Replete as Necessary -Repeat Mag Level in AM  Hematuria in the Setting of ESBL E Coli UTI; not present on admission and likely an nosocomial acquired infection -Initial UA on 12/31/2026 was negative -Patient's UA showed clear appearance with 50 glucose, large hemoglobin, trace leukocytes, rare bacteria, 21-50 RBCs per high-power field,  11-20 WBCs, and urine culture showed 80,000 CFU of ESBL E Coli  -IV antibiotics were changed to IV meropenem given her ESBL UTI -Continue to monitor and trend and treat for at least 5 days total -Had some hematuria in the urine canister from her Purewick; if not improving will need to discuss with urology -Attempting to set up IV Ertapenem for completion of Abx  -I spoke with neurology Dr. Louis Meckel who recommends continued treatment of the UTI and continued monitoring; he does not clinically feel that a Foley catheter is indicated at this time and does not recommend placing it for CBI as patient is draining well and renal function is stable along with her blood count -He recommends continued observation and outpatient follow-up  Hyperbilirubinemia  -Patient's T bili this morning was 1.8 and trended down to 0.4  -Likely reactive in the setting of chronic cholecystitis -Continue to monitor and trend and repeat CMP in a.m.  Type 2 Diabetes Mellitus,poorlycontroled withoutcomplication -Last hemoglobin A1c was10. -Continue low-dose Lantus, CBGs are stable and ranging from 125-140  Obesity -Estimated body mass index is 39.3 kg/m as calculated from the following:   Height as of this encounter: 5\' 9"  (1.753 m).   Weight as of this encounter: 120.7 kg. -Weight Loss and Dietary Counseling given  -Nutritionist consulted and recommending Ensure Enlive BID, Boost Breeze BID, MVI with Minerals and encouraging po Intake  -Will schedule IV Zofran to help determin if this aids in po intakes   DVT prophylaxis: SCDs Code Status: FULL CODE  Family  Communication: No family present at bedside  Disposition Plan: Pending Clinical Improvement back to baseline and tolerance of Diet and treatment of ESBL E. coli UTI.  She is back to the SDU for Pleur-X placement   Consultants:   General Surgery  Medical Oncology  IR  Pulmonary  Discussed with urology   Procedures:  Thoracentesis 01/02/2019;  Repeat yesterday was unsuccessful; will have Pleurx catheter to be placed today    Antimicrobials:  Anti-infectives (From admission, onward)   Start     Dose/Rate Route Frequency Ordered Stop   01/16/19 1200  ceFAZolin (ANCEF) 3 g in dextrose 5 % 50 mL IVPB     3 g 100 mL/hr over 30 Minutes Intravenous  Once 01/15/19 1403 01/16/19 1247   01/14/19 0000  ertapenem (INVANZ) IVPB     1 g Intravenous Every 24 hours 01/14/19 0857 01/19/19 2359   01/12/19 1800  meropenem (MERREM) 1 g in sodium chloride 0.9 % 100 mL IVPB     1 g 200 mL/hr over 30 Minutes Intravenous Every 8 hours 01/12/19 1709     01/12/19 1400  metroNIDAZOLE (FLAGYL) IVPB 500 mg  Status:  Discontinued     500 mg 100 mL/hr over 60 Minutes Intravenous Every 8 hours 01/12/19 0847 01/12/19 1709   01/12/19 1000  cefTRIAXone (ROCEPHIN) 2 g in sodium chloride 0.9 % 100 mL IVPB  Status:  Discontinued     2 g 200 mL/hr over 30 Minutes Intravenous Every 24 hours 01/12/19 0845 01/12/19 1709   01/12/19 0830  meropenem (MERREM) 1 g in sodium chloride 0.9 % 100 mL IVPB  Status:  Discontinued     1 g 200 mL/hr over 30 Minutes Intravenous Every 8 hours 01/12/19 0823 01/12/19 0825   01/08/19 1400  ciprofloxacin (CIPRO) IVPB 400 mg  Status:  Discontinued     400 mg 200 mL/hr over 60 Minutes Intravenous Every 12 hours 01/08/19 1311 01/12/19 0823   01/08/19 1400  metroNIDAZOLE (FLAGYL) IVPB 500 mg  Status:  Discontinued     500 mg 100 mL/hr over 60 Minutes Intravenous Every 8 hours 01/08/19 1311 01/12/19 0823   01/06/19 1215  ciprofloxacin (CIPRO) tablet 500 mg  Status:  Discontinued     500 mg Oral 2 times daily 01/06/19 1200 01/06/19 1201   01/01/19 2200  piperacillin-tazobactam (ZOSYN) IVPB 3.375 g  Status:  Discontinued     3.375 g 12.5 mL/hr over 240 Minutes Intravenous Every 8 hours 01/01/19 1450 01/08/19 1311   01/01/19 1500  piperacillin-tazobactam (ZOSYN) IVPB 3.375 g     3.375 g 100 mL/hr over 30 Minutes Intravenous  Once 01/01/19  1450 01/01/19 2045     Subjective: Seen and examined at bedside states pain is tolerable today. No nausea or vomiting and states she was doing ok. States blood in urine is clearing up. No other concerns at this time.   Objective: Vitals:   01/16/19 1645 01/16/19 1700 01/16/19 1800 01/16/19 2000  BP:  (!) 145/79 (!) 162/94 (!) 154/79  Pulse:  (!) 104 (!) 112 (!) 117  Resp:  (!) 30 (!) 29 (!) 33  Temp: 98.3 F (36.8 C)   98 F (36.7 C)  TempSrc: Oral   Oral  SpO2:  98% 97% 98%  Weight:      Height:        Intake/Output Summary (Last 24 hours) at 01/16/2019 2105 Last data filed at 01/16/2019 2029 Gross per 24 hour  Intake 204.26 ml  Output 1200 ml  Net -995.74 ml   Filed Weights   01/01/19 0416 01/04/19 1330 01/11/19 0515  Weight: 119.7 kg 119.9 kg 120.7 kg   Examination: Physical Exam:  Constitutional: WN/WD obese AAF in Mild and appears calm but uncomfortable Eyes: Lids and conjunctivae normal, sclerae anicteric  ENMT: External Ears, Nose appear normal. Grossly normal hearing. Mucous membranes are moist Neck: Appears normal, supple, no cervical masses, normal ROM, no appreciable thyromegaly; no JVD Respiratory: Diminished to auscultation bilaterally worse on the Right compared to the left with crackles and coarse breath sounds; mild wheezing, but no rales or rhonchi. Normal respiratory effort and patient is not tachypenic. No accessory muscle use.  Cardiovascular: Tachycardic rate but regular rhythm, no murmurs / rubs / gallops. S1 and S2 auscultated. 1+ LE extremity edema.  Abdomen: Soft, Tender to palpate, Distended to body habitus. No masses palpated. No appreciable hepatosplenomegaly. Bowel sounds positive x4.  GU: Deferred. Musculoskeletal: No clubbing / cyanosis of digits/nails. No joint deformity upper and lower extremities.  Skin: No rashes, lesions, ulcers on a limited skin evaluation. No induration; Warm and dry.  Neurologic: CN 2-12 grossly intact with no focal  deficits. Romberg sign and cerebellar reflexes not assessed.  Psychiatric: Normal judgment and insight. Alert and oriented x 3. Pleasant mood and appropriate affect.    Data Reviewed: I have personally reviewed following labs and imaging studies  CBC: Recent Labs  Lab 01/12/19 0434 01/13/19 1217 01/14/19 0313 01/15/19 0200 01/16/19 0400  WBC 6.6 6.4 5.9 5.7 6.0  NEUTROABS 5.3 4.8 4.4 4.1 4.5  HGB 10.2* 10.6* 9.4* 9.5* 9.8*  HCT 30.5* 31.9* 29.1* 29.3* 30.3*  MCV 95.6 98.5 99.0 99.0 100.0  PLT 37* 23* 20* 78* 91*   Basic Metabolic Panel: Recent Labs  Lab 01/12/19 0434 01/13/19 1217 01/14/19 0313 01/15/19 0200 01/16/19 0400  NA 136 139 138 140 141  K 3.6 3.6 3.7 3.5 3.6  CL 101 104 104 107 108  CO2 25 24 25 25 24   GLUCOSE 195* 152* 271* 152* 152*  BUN 9 9 9 6 7   CREATININE 0.77 0.72 0.84 0.73 0.73  CALCIUM 7.8* 7.9* 8.0* 8.2* 8.2*  MG 1.6* 1.7 1.7 1.5* 1.7  PHOS 2.7 6.2* 2.9 2.1* 2.3*   GFR: Estimated Creatinine Clearance: 115.6 mL/min (by C-G formula based on SCr of 0.73 mg/dL). Liver Function Tests: Recent Labs  Lab 01/12/19 0434 01/13/19 1217 01/14/19 0313 01/15/19 0200 01/16/19 0400  AST 219* 148* 116* 76* 58*  ALT 76* 67* 59* 50* 42  ALKPHOS 190* 236* 239* 249* 245*  BILITOT 0.9 1.0 0.4 0.4 0.4  PROT 6.3* 6.6 6.4* 6.7 6.5  ALBUMIN 2.6* 2.4* 2.3* 2.4* 2.4*   No results for input(s): LIPASE, AMYLASE in the last 168 hours. No results for input(s): AMMONIA in the last 168 hours. Coagulation Profile: No results for input(s): INR, PROTIME in the last 168 hours. Cardiac Enzymes: No results for input(s): CKTOTAL, CKMB, CKMBINDEX, TROPONINI in the last 168 hours. BNP (last 3 results) No results for input(s): PROBNP in the last 8760 hours. HbA1C: No results for input(s): HGBA1C in the last 72 hours. CBG: Recent Labs  Lab 01/16/19 0019 01/16/19 0258 01/16/19 0728 01/16/19 1640 01/16/19 2004  GLUCAP 161* 154* 140* 170* 158*   Lipid Profile: No  results for input(s): CHOL, HDL, LDLCALC, TRIG, CHOLHDL, LDLDIRECT in the last 72 hours. Thyroid Function Tests: No results for input(s): TSH, T4TOTAL, FREET4, T3FREE, THYROIDAB in the last 72 hours. Anemia Panel: No results for input(s): VITAMINB12,  FOLATE, FERRITIN, TIBC, IRON, RETICCTPCT in the last 72 hours. Sepsis Labs: No results for input(s): PROCALCITON, LATICACIDVEN in the last 168 hours.  Recent Results (from the past 240 hour(s))  Culture, Urine     Status: Abnormal   Collection Time: 01/10/19  6:00 PM   Specimen: Urine, Random  Result Value Ref Range Status   Specimen Description   Final    URINE, RANDOM Performed at [facility] [address] [place], [place] [address]    Special Requests   Final    NONE Performed at [facility], [place] [address] [place], [place] [address]    Culture (A)  Final    80,000 COLONIES/mL ESCHERICHIA COLI Confirmed Extended Spectrum Beta-Lactamase Producer (ESBL).  In bloodstream infections from ESBL organisms, carbapenems are preferred over piperacillin/tazobactam. They are shown to have a lower risk of mortality.    Report Status 01/12/2019 FINAL  Final   Organism ID, Bacteria ESCHERICHIA COLI (A)  Final      Susceptibility   Escherichia coli - MIC*    AMPICILLIN >=32 RESISTANT Resistant     CEFAZOLIN >=64 RESISTANT Resistant     CEFTRIAXONE >=64 RESISTANT Resistant     CIPROFLOXACIN >=4 RESISTANT Resistant     GENTAMICIN <=1 SENSITIVE Sensitive     IMIPENEM <=0.25 SENSITIVE Sensitive     NITROFURANTOIN <=16 SENSITIVE Sensitive     TRIMETH/SULFA >=320 RESISTANT Resistant     AMPICILLIN/SULBACTAM 8 SENSITIVE Sensitive     PIP/TAZO <=4 SENSITIVE Sensitive     Extended ESBL POSITIVE Resistant     * 80,000 COLONIES/mL ESCHERICHIA COLI     RN Pressure Injury Documentation: Pressure Injury 11/16/18 Buttocks Right;Medial Stage II -  Partial thickness loss of dermis presenting as a shallow open  ulcer with a red, pink wound bed without slough. right inner buttock (Active)  11/16/18 1815  Location: Buttocks  Location Orientation: Right;Medial  Staging: Stage II -  Partial thickness loss of dermis presenting as a shallow open ulcer with a red, pink wound bed without slough.  Wound Description (Comments): right inner buttock  Present on Admission: Yes     Estimated body mass index is 39.3 kg/m as calculated from the following:   Height as of this encounter: 5\' 9"  (1.753 m).   Weight as of this encounter: 120.7 kg.  Malnutrition Type:  Nutrition Problem: Increased nutrient needs Etiology: chronic illness, cancer and cancer related treatments   Malnutrition Characteristics:  Signs/Symptoms: estimated needs   Nutrition Interventions:  Interventions: Boost Breeze, Magic cup, Prostat, MVI, Ensure Enlive (each supplement provides 350kcal and 20 grams of protein)    Radiology Studies: [place] Chest (pleural Effusion)  Result Date: 01/16/2019 CLINICAL DATA:  Opacified right hemithorax. EXAM: CHEST ULTRASOUND COMPARISON:  Radiograph from earlier the same day FINDINGS: Moderate right pleural effusion. After discussion with referring MD, thoracentesis deferred pending scheduled CT chest. IMPRESSION: Right pleural effusion.  Thoracentesis deferred. Electronically Signed   By: Lucrezia Europe M.D.   On: 01/16/2019 08:30   Dg Chest Port 1 View  Result Date: 01/16/2019 CLINICAL DATA:  Status post right pleural drainage catheter placement today. EXAM: PORTABLE CHEST 1 VIEW COMPARISON:  Single-view of the chest 01/15/2019. FINDINGS: New pleural drainage catheter is in place and courses along the right hemidiaphragm and paravertebral space with its tip in the right upper lung zone. Right pleural effusion is decreased. There is a right pneumothorax estimated at 20%. The left lung is expanded and clear. Heart size is  normal. Port-A-Cath noted. IMPRESSION: New pleural drainage catheter in place. Right  pleural effusion has decreased but there is a right pneumothorax estimated 20%. Critical Value/emergent results were called by telephone at the time of interpretation on 01/16/2019 at 1:12 pm to [facility], RN, who verbally acknowledged these results. Electronically Signed   By: Inge Rise M.D.   On: 01/16/2019 13:12   Dg Chest Port 1 View  Result Date: 01/15/2019 CLINICAL DATA:  Lung carcinoma with shortness of breath EXAM: PORTABLE CHEST 1 VIEW COMPARISON:  January 14, 2019 chest radiograph and chest CT FINDINGS: There is extensive opacification throughout much of the right lung consistent with a combination effusion and patchy consolidation. The left lung is clear. Heart is upper normal in size with pulmonary vascularity normal. No adenopathy is appreciable by radiography. Port-A-Cath tip is in the superior vena cava. No bone lesions. No pneumothorax. IMPRESSION: Combination of effusion and consolidation with opacification of much of the right lung. Left lung essentially clear. Stable cardiac silhouette. No adenopathy appreciable by radiography. Port-A-Cath tip in superior vena cava. No pneumothorax. Electronically Signed   By: Lowella Grip III M.D.   On: 01/15/2019 19:59     Scheduled Meds: . sodium chloride   Intravenous Once  . atorvastatin  40 mg Oral q1800  . Chlorhexidine Gluconate Cloth  6 each Topical Daily  . docusate sodium  100 mg Oral BID  . DULoxetine  60 mg Oral Daily  . [START ON 01/17/2019] feeding supplement  1 Container Oral Q24H  . feeding supplement (ENSURE ENLIVE)  237 mL Oral BID BM  . feeding supplement (PRO-STAT SUGAR FREE 64)  30 mL Oral BID  . folic acid  1 mg Oral Daily  . insulin aspart  0-9 Units Subcutaneous Q4H  . insulin glargine  20 Units Subcutaneous QAC breakfast  . levothyroxine  150 mcg Oral QAC breakfast  . mouth rinse  15 mL Mouth Rinse BID  . metoprolol succinate  50 mg Oral QPM  . metoprolol succinate  75 mg Oral Daily  .  multivitamin with minerals  1 tablet Oral Daily  . ondansetron (ZOFRAN) IV  4 mg Intravenous Q6H   Or  . ondansetron  4 mg Oral Q6H  . pantoprazole  40 mg Oral Daily  . phosphorus  500 mg Oral BID  . polyethylene glycol  17 g Oral BID  . senna-docusate  2 tablet Oral BID  . vitamin B-12  1,000 mcg Oral Daily   Continuous Infusions: . sodium chloride    . sodium chloride 10 mL/hr (01/16/19 2055)  . meropenem (MERREM) IV Stopped (01/16/19 2029)    LOS: 14 days    Kerney Elbe, DO [facility] PAGER is on [place]  If 7PM-7AM, please contact night-coverage [email or web address] Password [email or web address] 01/16/2019, 9:05 PM

## 2019-01-16 NOTE — TOC Progression Note (Signed)
Transition of Care [facility]) - Progression Note    Patient Details  Name: HUGH GARROW MRN: [id number] Date of Birth: February 09, 1966  Transition of Care [facility]) CM/SW [place], LCSW Phone Number: 01/16/2019, 10:42 AM  Clinical Narrative:   CSW following patient for placement. Patients PASRR is pending. CSW faxed over clinicals to [facility] and is awaiting response from the [place].         Expected Discharge Plan and Services           Expected Discharge Date: (unknown)                                     Social Determinants of Health (SDOH) Interventions    Readmission Risk Interventions No flowsheet data found.

## 2019-01-16 NOTE — Care Management (Cosign Needed)
    Durable Medical Equipment  (From admission, onward)         Start     Ordered   01/16/19 1055  For home use only DME Hospital bed  Once    Question Answer Comment  Length of Need Lifetime   Patient has (list medical condition): Diabetes, Adenocarcinoma left lung stages. Port Catheter, Congestive Heart Failure   The above medical condition requires: Patient requires the ability to reposition frequently   Head must be elevated greater than: 45 degrees   Bed type Semi-electric   Support Surface: Gel Overlay      01/16/19 1058

## 2019-01-16 NOTE — Plan of Care (Signed)
  Problem: Activity: Goal: Risk for activity intolerance will decrease Outcome: Not Progressing  Needs assistance while ambulating.

## 2019-01-16 NOTE — Progress Notes (Signed)
   Name: Marilyn [name] MRN: [id number] DOB: September 28, 1965    ADMISSION DATE:  01/01/2019 CONSULTATION DATE:  01/16/2019  REFERRING MD :  Alfredia Ferguson, triad MD  CHIEF COMPLAINT: Dyspnea, abdominal pain  HISTORY OF PRESENT ILLNESS: 53 year old with metastatic lung adenocarcinoma and right malignant pleural effusion status thoracentesis x3.  We are consulted for recurrence of moderate right effusion and atelectasis of the right lung.    SIGNIFICANT EVENTS    STUDIES:  9/7 RT thoracentesis >> 400 cc 9/29 RT thoracenteses -920 cc, positive adenocarcinoma 10/11 CT chest Moderate right pleural effusion with multifocal right-sided dependent pulmonary opacities, favored to represent compressive atelectasis.  Redemonstration of thoracic adenopathy     PAST MEDICAL HISTORY :   has a past medical history of Arthritis, Asthma, Diabetes mellitus, Hemorrhoids, Hypercholesteremia, Hypertension, Hypothyroidism, Metastatic lung cancer (metastasis from lung to other site) [facility]), Neuropathy, NICM (nonischemic cardiomyopathy) ([place]), Obese, Pneumonia (2017), and Sinus tachycardia.  has a past surgical history that includes Cesarean section; Tubal ligation; US guided core needle biopsy; ir generic historical (01/08/2016); ir generic historical (01/08/2016); IR FLUORO GUIDE PORT INSERTION RIGHT (02/18/2017); IR US Guide Vasc Access Right (02/18/2017); I/D  arm (Right, 2013); Colonoscopy; Multiple extractions with alveoloplasty (N/A, 03/25/2017); and IR THORACENTESIS ASP PLEURAL SPACE W/IMG GUIDE (05/25/2018).   SUBJECTIVE:  No acute distress however is a little more short of breath today VITAL SIGNS: Temp:  [97.7 F (36.5 C)-98.3 F (36.8 C)] 98.3 F (36.8 C) (10/13 0500) Pulse Rate:  [110-124] 124 (10/13 0500) Resp:  [18-19] 19 (10/13 0500) BP: (138-206)/(91-113) 167/96 (10/13 0500) SpO2:  [97 %-98 %] 98 % (10/13 0500)  PHYSICAL EXAMINATION: General: This is a pleasant obese 53 year old black female she is  resting comfortably in bed and in no acute distress this morning HEENT normocephalic atraumatic no jugular venous distention is appreciated Pulmonary: Diminished right base no accessory use Cardiac: Regular rate and rhythm Abdomen: Soft nontender Extremities: Warm dry brisk cap refill Neuro: Intact.   ASSESSMENT / PLAN:  Metastatic lung adenocarcinoma Malignant right effusion, recurrent-  Right atelectasis Thrombocytopenia    Plan We will place Pleurx today      01/16/2019, 10:14 AM

## 2019-01-16 NOTE — NC FL2 (Signed)
[place] LEVEL OF CARE SCREENING TOOL     IDENTIFICATION  Patient Name: Marilyn [name] Birthdate: May 28, 1965 Sex: female Admission Date (Current Location): 01/01/2019  [facility] and [place] Number:  Herbalist and Address:  [facility],  [address] [place], [place]      Provider Number: [id number]  Attending Physician Name and Address:  Kerney Elbe, DO  Relative Name and Phone Number:  Venora Maples [id number],[phone]    Current Level of Care: Hospital Recommended Level of Care: [place] Prior Approval Number:    Date Approved/Denied:   PASRR Number: pending  Discharge Plan: SNF    Current Diagnoses: Patient Active Problem List   Diagnosis Date Noted  . Urinary tract infection due to extended-spectrum beta lactamase (ESBL) producing Escherichia coli 01/12/2019  . Biliary colic   . Chemotherapy-induced neutropenia ([place])   . Metastatic adenocarcinoma to lung, right ([place])   . Cholecystitis, acute   . Recurrent right pleural effusion 01/01/2019  . Acute respiratory failure with hypoxia ([place]) 12/10/2018  . Pressure injury of skin 11/17/2018  . Pleural effusion   . Acute lower UTI 04/30/2018  . Obese 04/30/2018  . Degenerative cervical spinal stenosis 04/30/2018  . HLD (hyperlipidemia) 04/29/2018  . Asthma 04/29/2018  . Depression 04/29/2018  . Chronic diastolic CHF (congestive heart failure) ([place]) 04/29/2018  . Left arm weakness and numbness 04/29/2018  . Right arm weakness 04/29/2018  . Right sided numbness   . 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[place]) 03/16/2018  . Counseling regarding advance care planning and goals of care 01/03/2018  . Essential hypertension   . DCM (dilated cardiomyopathy) ([place])   . Non-ischemic cardiomyopathy ([place]) 02/04/2017  . Metastasis to skin ([place]) 12/02/2016  . Acute systolic heart failure ([place]) 12/01/2016  . Primary cancer of right lung metastatic to other  site ([place]) 11/24/2016  . SOB (shortness of breath) 10/26/2016  . Hypothyroidism 08/22/2016  . PICC (peripherally inserted central catheter) flush 07/16/2016  . Community acquired pneumonia   . Sinus tachycardia   . Thrush of mouth and esophagus ([place]) 04/07/2016  . Constipation 04/07/2016  . Palliative care by specialist   . Port catheter in place 01/19/2016  . Dehydration 01/14/2016  . Tachycardia 01/14/2016  . Adenocarcinoma of left lung, stage 3 ([place]) 01/01/2016  . Non-small cell lung cancer ([place]) 12/31/2015  . Mediastinal mass   . Chest pain 12/26/2015  . Headache 12/26/2015  . Diabetes mellitus without complication ([place]) 09/29/9483    Orientation RESPIRATION BLADDER Height & Weight     Self, Time, Situation, Place  Normal Continent, External catheter Weight: 266 lb 1.5 oz (120.7 kg) Height:  5\' 9"  (175.3 cm)  BEHAVIORAL SYMPTOMS/MOOD NEUROLOGICAL BOWEL NUTRITION STATUS      Continent Diet(soft/carb modified)  AMBULATORY STATUS COMMUNICATION OF NEEDS Skin   Limited Assist Verbally                         Personal Care Assistance Level of Assistance  Bathing, Feeding, Dressing Bathing Assistance: Limited assistance Feeding assistance: Independent Dressing Assistance: Limited assistance     Functional Limitations Info  Sight, Speech, Hearing Sight Info: Adequate Hearing Info: Adequate Speech Info: Adequate    SPECIAL CARE FACTORS FREQUENCY  PT (By licensed PT), OT (By licensed OT)     PT Frequency: 5x wk OT Frequency: 5x wk            Contractures Contractures Info: Not  present    Additional Factors Info  Code Status, Isolation Precautions Code Status Info: Full Code       Isolation Precautions Info: MRSA ESBL     Current Medications (01/16/2019):  This is the current hospital active medication list Current Facility-Administered Medications  Medication Dose Route Frequency Provider Last Rate Last Dose  . 0.9 %  sodium chloride infusion  (Manually program via Guardrails IV Fluids)   Intravenous Once [place], Omair Latif, DO      . 0.9 %  sodium chloride infusion   Intravenous PRN Domenic Polite, MD      . 0.9 %  sodium chloride infusion   Intravenous PRN Domenic Polite, MD 10 mL/hr at 01/14/19 0156 250 mL at 01/14/19 0156  . atorvastatin (LIPITOR) tablet 40 mg  40 mg Oral q1800 Lavina Hamman, MD   40 mg at 01/14/19 1709  . ceFAZolin (ANCEF) 3 g in dextrose 5 % 50 mL IVPB  3 g Intravenous Once Polly Cobia, RPH      . Chlorhexidine Gluconate Cloth 2 % PADS 6 each  6 each Topical Daily Raiford Noble [place], DO   6 each at 01/15/19 1000  . docusate sodium (COLACE) capsule 100 mg  100 mg Oral BID Lavina Hamman, MD   100 mg at 01/14/19 1231  . DULoxetine (CYMBALTA) DR capsule 60 mg  60 mg Oral Daily Lavina Hamman, MD   60 mg at 01/14/19 1230  . feeding supplement (BOOST / RESOURCE BREEZE) liquid 1 Container  1 Container Oral BID BM Domenic Polite, MD   1 Container at 01/11/19 1639  . feeding supplement (ENSURE ENLIVE) (ENSURE ENLIVE) liquid 237 mL  237 mL Oral BID BM Domenic Polite, MD   237 mL at 44/01/02 7253  . folic acid (FOLVITE) tablet 1 mg  1 mg Oral Daily Domenic Polite, MD   1 mg at 01/14/19 1232  . hydrALAZINE (APRESOLINE) injection 10 mg  10 mg Intravenous Q6H PRN Domenic Polite, MD   10 mg at 01/16/19 0100  . insulin aspart (novoLOG) injection 0-9 Units  0-9 Units Subcutaneous Q4H Domenic Polite, MD   1 Units at 01/16/19 [phone]  . insulin glargine (LANTUS) injection 20 Units  20 Units Subcutaneous QAC breakfast Domenic Polite, MD   20 Units at 01/16/19 [phone]  . levalbuterol (XOPENEX) nebulizer solution 0.63 mg  0.63 mg Nebulization Q8H PRN Lavina Hamman, MD      . levothyroxine (SYNTHROID) tablet 150 mcg  150 mcg Oral QAC breakfast Lavina Hamman, MD   150 mcg at 01/16/19 0700  . LORazepam (ATIVAN) tablet 0.5 mg  0.5 mg Oral Q6H PRN Raiford Noble Latif, DO      . MEDLINE mouth rinse  15 mL Mouth Rinse BID  Domenic Polite, MD   15 mL at 01/14/19 1241  . meropenem (MERREM) 1 g in sodium chloride 0.9 % 100 mL IVPB  1 g Intravenous Q8H BellSharyn Lull T, RPH 200 mL/hr at 01/16/19 0104 1 g at 01/16/19 0104  . metoprolol succinate (TOPROL-XL) 24 hr tablet 50 mg  50 mg Oral QPM Sheikh, Georgina [place], DO   50 mg at 01/14/19 1709  . metoprolol succinate (TOPROL-XL) 24 hr tablet 75 mg  75 mg Oral Daily Raiford Noble [place], DO   75 mg at 01/14/19 1022  . morphine 2 MG/ML injection 2 mg  2 mg Intravenous Q3H PRN Domenic Polite, MD   2 mg at 01/15/19 1017  . multivitamin with  minerals tablet 1 tablet  1 tablet Oral Daily Domenic Polite, MD   1 tablet at 01/14/19 1232  . ondansetron (ZOFRAN) tablet 4 mg  4 mg Oral Q6H PRN Lavina Hamman, MD   4 mg at 01/09/19 0945   Or  . ondansetron (ZOFRAN) injection 4 mg  4 mg Intravenous Q6H PRN Lavina Hamman, MD   4 mg at 01/13/19 1627  . oxyCODONE (Oxy IR/ROXICODONE) immediate release tablet 5 mg  5 mg Oral Q4H PRN Lavina Hamman, MD   5 mg at 01/11/19 1644  . pantoprazole (PROTONIX) EC tablet 40 mg  40 mg Oral Daily Lavina Hamman, MD   40 mg at 01/14/19 1232  . phosphorus (K PHOS NEUTRAL) tablet 500 mg  500 mg Oral BID Sheikh, Omair Latif, DO      . polyethylene glycol (MIRALAX / GLYCOLAX) packet 17 g  17 g Oral BID Domenic Polite, MD   17 g at 01/14/19 1022  . potassium PHOSPHATE 10 mmol in dextrose 5 % 250 mL infusion  10 mmol Intravenous Once Raiford Noble [place], DO 42 mL/hr at 01/16/19 0905 10 mmol at 01/16/19 0905  . senna-docusate (Senokot-S) tablet 2 tablet  2 tablet Oral BID Domenic Polite, MD   2 tablet at 01/14/19 1231  . sodium chloride flush (NS) 0.9 % injection 10-40 mL  10-40 mL Intracatheter PRN Donne Hazel, MD   10 mL at 01/02/19 1938  . vitamin B-12 (CYANOCOBALAMIN) tablet 1,000 mcg  1,000 mcg Oral Daily Domenic Polite, MD   1,000 mcg at 01/14/19 1233   Facility-Administered Medications Ordered in Other Encounters  Medication Dose Route  Frequency Provider Last Rate Last Dose  . sodium chloride 0.9 % injection 10 mL  10 mL Intravenous PRN [name], MD   10 mL at 05/24/16 1252     Discharge Medications: Please see discharge summary for a list of discharge medications.  Relevant Imaging Results:  Relevant Lab Results:   Additional Information SS# [id number]  Wende Neighbors, LCSW

## 2019-01-16 NOTE — TOC Progression Note (Addendum)
Transition of Care [facility]) - Progression Note    Patient Details  Name: Marilyn [name] MRN: [id number] Date of Birth: 1965-05-04  Transition of Care [facility]) CM/SW Contact  Purcell Mouton, RN Phone Number: 01/16/2019, 11:02 AM  Clinical Narrative:    Pt was active with [facility] for [facility] if she does not go to SNF. Hospital bed was ordered at pt's request.        Expected Discharge Plan and Services           Expected Discharge Date: (unknown)                                     Social Determinants of Health (SDOH) Interventions    Readmission Risk Interventions No flowsheet data found.

## 2019-01-16 NOTE — Progress Notes (Signed)
Nutrition Follow-up  DOCUMENTATION CODES:   Obesity unspecified  INTERVENTION:  - will decrease Boost Breeze from BID to once/day, each supplement provides 250 kcal and 9 grams protein. - continue Ensure Enlive BID, each supplement provides 350 kcal and 20 grams of protein. - will order 30 mL Prostat BID, each supplement provides 100 kcal and 15 grams of protein. - will order Magic Cup with dinner meals, each supplement provides 290 kcal and 9 grams of protein. - continue to encourage PO intakes.  * weigh patient today.  * recommend scheduled, rather than PRN, anti-emetic/anti-nausea medication to determine if this aids in increasing PO intakes.    NUTRITION DIAGNOSIS:   Increased nutrient needs related to chronic illness, cancer and cancer related treatments as evidenced by estimated needs. -ongoing  GOAL:   Patient will meet greater than or equal to 90% of their needs -likely unmet  MONITOR:   PO intake, Supplement acceptance, Labs, Weight trends  ASSESSMENT:   53 y.o. female with medical history of metastatic adenocarcinoma, non-ischemic cardiomyopathy, HTN, hypothyroidism, malignant pleural effusion s/p thoracentesis x3, hypothyroidism. Patient presented to the ED on 9/28 with complaints of abdominal pain. She reported that pain has been ongoing for 3 months but was worse for the past 3 days. Patient also reported nausea and constipation. Patient's last chemo was on 9/23. She has been having ongoing SOB and chronic cough. Abdominal ultrasound showed cholecystitis. CT scan showed gallbladder wall thickening, no other acute abnormalities. She was admitted for recurrent R pleural effusion for thoracentesis as well as pain control of abdomen.  Patient was last weighed on 10/8. Per review of flow sheet, most recent intakes were 25% of lunch on 10/9 and 0% of dinner on 10/12. Patient reports ongoing pain after Pleurx, cries out in pain intermittently. Unable to obtain nutrition-related  information. Lunch tray was delivered after RD visit. When RD saw patient last week, she reported ongoing N/V but did not provide much information. Will continue efforts.   Per review of orders, she has been accepting Boost Breeze 15-25% of the time offered and Ensure ~50% of the time offered. Will adjust supplements as outlined above.   Per notes: - metastatic lung adenocarcinoma (NSCLC) - recurrent malignant R pleural effusion--s/p Pleurx placement earlier today - R atelectasis - thrombocytopenia - acute on chronic cholecystitis--slightly improved - MD note 10/12 states patient has ongoing nausea - thrombocytopenia  - macrocytic anemia - depression  - UTI   Labs reviewed; CBG: 140 mg/dl, Ca: 8.2 mg/dl, Phos: 2.3 mg/dl, Alk Phos and AST elevated.  Medications reviewed; 100 mg colace BID, 1 mg folvite/day, sliding scale novolog, 20 units lantus/day, 150 mcg oral synthroid/day, daily multivitamin with minerals, 500 mg KPhos neutral x2 doses 10/13, 1 packet miralax BID, 10 mmol IV KPhos x1 run 10/13, 2 tablets senokot BID, 1000 mcg oral cyanocobalamin/day.     Diet Order:   Diet Order            DIET SOFT Room service appropriate? Yes; Fluid consistency: Thin  Diet effective now              EDUCATION NEEDS:   No education needs have been identified at this time  Skin:  Skin Assessment: Reviewed RN Assessment  Last BM:  9/27  Height:   Ht Readings from Last 1 Encounters:  01/04/19 5' 9" (1.753 m)    Weight:   Wt Readings from Last 1 Encounters:  01/11/19 120.7 kg    Ideal Body Weight:  65.9  kg  BMI:  Body mass index is 39.3 kg/m.  Estimated Nutritional Needs:   Kcal:  2250-2450 kcal  Protein:  115-125 grams  Fluid:  >/= 2.2 L/day      , MS, RD, LDN, CNSC Inpatient Clinical Dietitian Pager # [phone] After hours/weekend pager # [phone]  

## 2019-01-16 NOTE — Procedures (Signed)
Chest Tube/ pleurx  Insertion Procedure Note  Indications:  Clinically significant Effusion  Pre-operative Diagnosis: Effusion  Post-operative Diagnosis: Effusion  Procedure Details  Informed consent was obtained for the procedure, including sedation.  Risks of lung perforation, hemorrhage, arrhythmia, and adverse drug reaction were discussed.   After sterile skin prep, using standard technique, a 16 French tube was placed in the right lateral  Rib  space which was identified via real time [place]. Then guide wire threaded into the pleural space, then at that point the cath tract was localized. An anterior incision was made. Then the metal dialator was threaded with cath attached and positioned in place so that the antimicrobial cuff was in the subcutaneous tissue. We then made a series of dilations over the guide wire and the peel away dilator was removed and cath positioned in place. The incision sites were closed.    Findings: 1100 ml of serous fluid obtained  Estimated Blood Loss:  Minimal         Specimens:  None              Complications:  None; patient tolerated the procedure well.         Disposition: sdu          Condition: stable  Erick Colace ACNP-BC supervised and under direct supervision of Dr Tamala Julian.  [place] Pager # [phone] OR # [phone] if no answer

## 2019-01-16 NOTE — Progress Notes (Addendum)
Pt transfer to ICU/SD for Pleurax insertion. Pt alert and oriented is knowledgeable on procedure. Consent to be completed in ICU/SD. Family aware by patient. Denies pain, Pt NPO for prep of procedure. Report hand off given to [place], Therapist, sports. Pt CBG 140 @ 0800. Lantus given as ordered. Updated RN. Pt remain unchanged transferred in  bed. SRP, RN

## 2019-01-16 NOTE — Progress Notes (Signed)
Post-chest tube placement CXR reviewed.  Pt assessed at bedside. She reports incision site discomfort (which we expected) but no distress. CXR showing pleural cath in good position but there was not re-expansion of the right upper lobe. I think that the 20% PTX mentioned represents trapped lung and the lung not re-expanding. Do not think that this represents actual PTX  Plan Will cont to monitor in SDU setting  Am cxr  Erick Colace ACNP-BC [place] Pager # [phone] OR # [phone] if no answer

## 2019-01-16 NOTE — Plan of Care (Signed)
Pt refusing meds and seems to be more withdrawn tonight.

## 2019-01-17 ENCOUNTER — Inpatient Hospital Stay: Payer: Medicare Other

## 2019-01-17 ENCOUNTER — Inpatient Hospital Stay (HOSPITAL_COMMUNITY): Payer: Medicare Other

## 2019-01-17 ENCOUNTER — Telehealth: Payer: Self-pay | Admitting: *Deleted

## 2019-01-17 ENCOUNTER — Inpatient Hospital Stay: Payer: Medicare Other | Admitting: Hematology

## 2019-01-17 LAB — CBC WITH DIFFERENTIAL/PLATELET
Abs Immature Granulocytes: 0.04 10*3/uL (ref 0.00–0.07)
Basophils Absolute: 0 10*3/uL (ref 0.0–0.1)
Basophils Relative: 0 %
Eosinophils Absolute: 0.1 10*3/uL (ref 0.0–0.5)
Eosinophils Relative: 2 %
HCT: 27.5 % — ABNORMAL LOW (ref 36.0–46.0)
Hemoglobin: 9 g/dL — ABNORMAL LOW (ref 12.0–15.0)
Immature Granulocytes: 1 %
Lymphocytes Relative: 14 %
Lymphs Abs: 0.7 10*3/uL (ref 0.7–4.0)
MCH: 32.8 pg (ref 26.0–34.0)
MCHC: 32.7 g/dL (ref 30.0–36.0)
MCV: 100.4 fL — ABNORMAL HIGH (ref 80.0–100.0)
Monocytes Absolute: 0.7 10*3/uL (ref 0.1–1.0)
Monocytes Relative: 14 %
Neutro Abs: 3.3 10*3/uL (ref 1.7–7.7)
Neutrophils Relative %: 69 %
Platelets: 93 10*3/uL — ABNORMAL LOW (ref 150–400)
RBC: 2.74 MIL/uL — ABNORMAL LOW (ref 3.87–5.11)
RDW: 17.4 % — ABNORMAL HIGH (ref 11.5–15.5)
WBC: 4.9 10*3/uL (ref 4.0–10.5)
nRBC: 0 % (ref 0.0–0.2)

## 2019-01-17 LAB — COMPREHENSIVE METABOLIC PANEL
ALT: 35 U/L (ref 0–44)
AST: 51 U/L — ABNORMAL HIGH (ref 15–41)
Albumin: 2.3 g/dL — ABNORMAL LOW (ref 3.5–5.0)
Alkaline Phosphatase: 240 U/L — ABNORMAL HIGH (ref 38–126)
Anion gap: 8 (ref 5–15)
BUN: 10 mg/dL (ref 6–20)
CO2: 26 mmol/L (ref 22–32)
Calcium: 8.1 mg/dL — ABNORMAL LOW (ref 8.9–10.3)
Chloride: 104 mmol/L (ref 98–111)
Creatinine, Ser: 0.77 mg/dL (ref 0.44–1.00)
GFR calc Af Amer: >60 mL/min (ref >60)
GFR calc non Af Amer: >60 mL/min (ref >60)
Glucose, Bld: 210 mg/dL — ABNORMAL HIGH (ref 70–99)
Potassium: 4.1 mmol/L (ref 3.5–5.1)
Sodium: 138 mmol/L (ref 135–145)
Total Bilirubin: 0.3 mg/dL (ref 0.3–1.2)
Total Protein: 6.3 g/dL — ABNORMAL LOW (ref 6.5–8.1)

## 2019-01-17 LAB — GLUCOSE, CAPILLARY
Glucose-Capillary: 190 mg/dL — ABNORMAL HIGH (ref 70–99)
Glucose-Capillary: 156 mg/dL — ABNORMAL HIGH (ref 70–99)
Glucose-Capillary: 120 mg/dL — ABNORMAL HIGH (ref 70–99)
Glucose-Capillary: 203 mg/dL — ABNORMAL HIGH (ref 70–99)
Glucose-Capillary: 180 mg/dL — ABNORMAL HIGH (ref 70–99)

## 2019-01-17 LAB — PHOSPHORUS: Phosphorus: 3.6 mg/dL (ref 2.5–4.6)

## 2019-01-17 LAB — MAGNESIUM: Magnesium: 1.6 mg/dL — ABNORMAL LOW (ref 1.7–2.4)

## 2019-01-17 NOTE — Progress Notes (Signed)
PROGRESS NOTE    Marilyn [name]  QTM:[id number] DOB: Aug 08, 1965 DOA: 01/01/2019 PCP: [name], FNP    Brief Narrative:  53 year old female who presented with abdominal pain.  She does have significant past medical history for metastatic adenocarcinoma, nonischemic cardiomyopathy, hypertension, hypothyroidism and malignant pleural effusions status post thoracentesis x3 the past.  Reported chronic abdominal pain that worsened about 3 days prior to hospitalization, no nausea no vomiting, no fevers no chills.  Patient has been receiving chemotherapy, last infusion December 27, 2018.  On her initial physical examination blood pressure 161/90, heart rate 132, respiratory 26, oxygen saturation 89%.  Heart S1-S2 present, rhythmic, lungs clear to auscultation bilaterally, abdomen soft, diffuse tenderness, no lower extremity edema.  CT of the abdomen with gallbladder contracted with gallbladder wall mildly thickened, common bile duct dilatation 12 mm, no apparent calculus.  CT chest with no pulmonary embolism, positive right pleural effusion with compressive atelectasis, complete collapse of the right middle and lower lobes.   Patient was admitted to the hospital with a working diagnosis of acute on chronic abdominal pain complicated by recurrent large right pleural effusion.  Further work-up with HIDA scan showed chronic cholecystitis.  She was deemed not a candidate for surgical intervention, by general surgery.  Interventional radiology was not able to place a cholecystostomy drain due to contracted gallbladder.  Patient was treated medically with antibiotics.  Throughout the course of her hospitalization he was diagnosed with ESBL E. coli urinary tract infection.  Due to persistent dyspnea, tachycardia and regular effusion patient has undergone prior catheter placement.  During her hospitalization she has required transfusion, 2 units packed red blood cells due to suspected acute blood loss  anemia, related to hematuria.    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 Acute on chronic cholecystitis. Diet has been advanced with good toleration continue symptomatic control.  Outpatient follow-up.  2. Recurrent right malignant pleural effusion, successful per catheter placement continue close follow-up and drain per protocol.  3. Metastatic non small cell lung cancer.  Will need outpatient follow-up.  4. Thrombocytopenia and acute blood loss anemia (hematuria).  S[place]un has remained stable, follow-up CBC in the morning.  5. Acute on chronic diastolic heart failure.  Clinically euvolemic, continue blood pressure control.  6. Urine infection with ESBL E coli. No present on admission.  Patient with ESBL E. coli bacteremia, will continue IV antibiotic therapy for now.  7. T2DM continue insulin sliding scale for glucose coverage and monitoring.  Continue basal insulin with 20 units of glargine.  8. Obesity.  Patient's BMI 39.3, high risk for complications, with outpatient follow-up.   DVT prophylaxis: enoxaparin   Code Status:  full Family Communication: no family at the bedside  Disposition Plan/ discharge barriers: pending clinical improvement.   Body mass index is 39.3 kg/m. Malnutrition Type:  Nutrition Problem: Increased nutrient needs Etiology: chronic illness, cancer and cancer related treatments    Malnutrition  Characteristics:  Signs/Symptoms: estimated needs   Nutrition Interventions:  Interventions: Boost Breeze, Magic cup, Prostat, MVI, Ensure Enlive (each supplement provides 350kcal and 20 grams of protein)  RN Pressure Injury Documentation:    Consultants:   Pulmonary   Procedures:     Antimicrobials:  Meropenem     Subjective: Patient is feeling better, dyspnea has improved, but continue to have pleuritic chest pain, no nausea or vomiting.   Objective: Vitals:   01/17/19 0400 01/17/19 0500 01/17/19 0600 01/17/19 0700  BP: (!) 167/93 (!) 168/88 (!) 177/98 (!) 160/95  Pulse: (!) 108 (!) 105 (!) 104 (!) 104  Resp: (!) 26 (!) 26 (!) 21 18  Temp: 97.9 F (36.6 C)     TempSrc: Oral     SpO2: 94% 95% 94% 92%  Weight:      Height:        Intake/Output Summary (Last 24 hours) at 01/17/2019 0942 Last data filed at 01/17/2019 4097 Gross per 24 hour  Intake 751.92 ml  Output 1200 ml  Net -448.08 ml   Filed Weights   01/01/19 0416 01/04/19 1330 01/11/19 0515  Weight: 119.7 kg 119.9 kg 120.7 kg    Examination:   General: Not in pain or dyspnea, deconditioned  Neurology: Awake and alert, non focal  E ENT: mild pallor, no icterus, oral mucosa moist Cardiovascular: No JVD. S1-S2 present, rhythmic, no gallops, rubs, or murmurs. No lower extremity edema. Pulmonary: positive breath sounds bilaterally, adequate air movement, no wheezing, rhonchi or rales. Pleural catheter at the right mid scapular line.  Gastrointestinal. Abdomen protuberant with no organomegaly, non tender, no rebound or guarding Skin. No rashes Musculoskeletal: no joint deformities     Data Reviewed: I have personally reviewed following labs and imaging studies  CBC: Recent Labs  Lab 01/13/19 1217 01/14/19 0313 01/15/19 0200 01/16/19 0400 01/17/19 0410  WBC 6.4 5.9 5.7 6.0 4.9  NEUTROABS 4.8 4.4 4.1 4.5 3.3  HGB 10.6* 9.4* 9.5* 9.8* 9.0*  HCT 31.9* 29.1* 29.3* 30.3*  27.5*  MCV 98.5 99.0 99.0 100.0 100.4*  PLT 23* 20* 78* 91* 93*   Basic Metabolic Panel: Recent Labs  Lab 01/13/19 1217 01/14/19 0313 01/15/19 0200 01/16/19 0400 01/17/19 0410  NA 139 138 140 141 138  K 3.6 3.7 3.5 3.6 4.1  CL 104 104 107 108 104  CO2 24 25 25 24 26   GLUCOSE 152* 271* 152* 152* 210*  BUN 9 9 6 7 10   CREATININE 0.72 0.84 0.73 0.73 0.77  CALCIUM 7.9* 8.0* 8.2* 8.2* 8.1*  MG 1.7 1.7 1.5* 1.7 1.6*  PHOS 6.2* 2.9 2.1* 2.3* 3.6   GFR: Estimated Creatinine Clearance: 115.6 mL/min (by C-G formula based on SCr of 0.77 mg/dL). Liver Function Tests: Recent Labs  Lab 01/13/19 1217 01/14/19 0313 01/15/19 0200 01/16/19 0400 01/17/19 0410  AST 148* 116* 76* 58* 51*  ALT 67* 59* 50* 42 35  ALKPHOS 236* 239* 249* 245* 240*  BILITOT 1.0 0.4 0.4 0.4 0.3  PROT 6.6 6.4* 6.7 6.5 6.3*  ALBUMIN 2.4* 2.3* 2.4* 2.4* 2.3*   No results for input(s): LIPASE, AMYLASE in the last 168 hours. No results for input(s): AMMONIA in the last 168 hours. Coagulation Profile: No results for input(s): INR, PROTIME in the last 168 hours. Cardiac Enzymes: No results for input(s): CKTOTAL, CKMB, CKMBINDEX, TROPONINI in the last 168 hours. BNP (last 3 results) No results for input(s): PROBNP in the last 8760 hours. HbA1C: No results for input(s): HGBA1C in the  last 72 hours. CBG: Recent Labs  Lab 01/16/19 1640 01/16/19 2004 01/16/19 2340 01/17/19 0333 01/17/19 0847  GLUCAP 170* 158* 176* 190* 156*   Lipid Profile: No results for input(s): CHOL, HDL, LDLCALC, TRIG, CHOLHDL, LDLDIRECT in the last 72 hours. Thyroid Function Tests: No results for input(s): TSH, T4TOTAL, FREET4, T3FREE, THYROIDAB in the last 72 hours. Anemia Panel: No results for input(s): VITAMINB12, FOLATE, FERRITIN, TIBC, IRON, RETICCTPCT in the last 72 hours.    Radiology Studies: I have reviewed all of the imaging during this hospital visit personally     Scheduled Meds: . sodium chloride   Intravenous  Once  . atorvastatin  40 mg Oral q1800  . Chlorhexidine Gluconate Cloth  6 each Topical Daily  . docusate sodium  100 mg Oral BID  . DULoxetine  60 mg Oral Daily  . feeding supplement  1 Container Oral Q24H  . feeding supplement (ENSURE ENLIVE)  237 mL Oral BID BM  . feeding supplement (PRO-STAT SUGAR FREE 64)  30 mL Oral BID  . folic acid  1 mg Oral Daily  . insulin aspart  0-9 Units Subcutaneous Q4H  . insulin glargine  20 Units Subcutaneous QAC breakfast  . levothyroxine  150 mcg Oral QAC breakfast  . mouth rinse  15 mL Mouth Rinse BID  . metoprolol succinate  50 mg Oral QPM  . metoprolol succinate  75 mg Oral Daily  . multivitamin with minerals  1 tablet Oral Daily  . ondansetron (ZOFRAN) IV  4 mg Intravenous Q6H   Or  . ondansetron  4 mg Oral Q6H  . pantoprazole  40 mg Oral Daily  . phosphorus  500 mg Oral BID  . polyethylene glycol  17 g Oral BID  . senna-docusate  2 tablet Oral BID  . vitamin B-12  1,000 mcg Oral Daily   Continuous Infusions: . sodium chloride    . sodium chloride 10 mL/hr at 01/17/19 0600  . meropenem (MERREM) IV Stopped (01/17/19 0437)     LOS: 15 days        Mauricio Gerome Apley, MD

## 2019-01-17 NOTE — Progress Notes (Addendum)
   Name: Marilyn [name] MRN: [id number] DOB: 10-03-1965    ADMISSION DATE:  01/01/2019 CONSULTATION DATE:  01/17/2019  REFERRING MD :  Alfredia Ferguson, triad MD  CHIEF COMPLAINT: Dyspnea, abdominal pain  HISTORY OF PRESENT ILLNESS: 53 year old with metastatic lung adenocarcinoma and right malignant pleural effusion status thoracentesis x3.  We are consulted for recurrence of moderate right effusion and atelectasis of the right lung.    SIGNIFICANT EVENTS   Pleurx catheter placed on right side on 10/13  STUDIES:  9/7 RT thoracentesis >> 400 cc 9/29 RT thoracenteses -920 cc, positive adenocarcinoma 10/11 CT chest Moderate right pleural effusion with multifocal right-sided dependent pulmonary opacities, favored to represent compressive atelectasis.  Redemonstration of thoracic adenopathy     PAST MEDICAL HISTORY :   has a past medical history of Arthritis, Asthma, Diabetes mellitus, Hemorrhoids, Hypercholesteremia, Hypertension, Hypothyroidism, Metastatic lung cancer (metastasis from lung to other site) [facility]), Neuropathy, NICM (nonischemic cardiomyopathy) ([place]), Obese, Pneumonia (2017), and Sinus tachycardia.  has a past surgical history that includes Cesarean section; Tubal ligation; US guided core needle biopsy; ir generic historical (01/08/2016); ir generic historical (01/08/2016); IR FLUORO GUIDE PORT INSERTION RIGHT (02/18/2017); IR US Guide Vasc Access Right (02/18/2017); I/D  arm (Right, 2013); Colonoscopy; Multiple extractions with alveoloplasty (N/A, 03/25/2017); and IR THORACENTESIS ASP PLEURAL SPACE W/IMG GUIDE (05/25/2018).   SUBJECTIVE:  No acute distress however is a little more short of breath today VITAL SIGNS: Temp:  [97.9 F (36.6 C)-98.3 F (36.8 C)] 97.9 F (36.6 C) (10/14 0400) Pulse Rate:  [104-124] 104 (10/14 0700) Resp:  [0-35] 18 (10/14 0700) BP: (145-213)/(63-124) 160/95 (10/14 0700) SpO2:  [92 %-100 %] 92 % (10/14 0700)  PHYSICAL EXAMINATION: General this is a  very pleasant 53 year old black female she is resting in bed and in no acute distress this morning HEENT normocephalic atraumatic no jugular venous distention is appreciated Pulmonary: Clear to auscultation somewhat diminished right base.  The Pleurx dressing is intact and without drainage Cardiac: Regular rate and rhythm without murmur rub or gallop Abdomen: Soft nontender no organomegaly Extremities: Warm dry brisk cap refill strong pulses. GU: Voids   ASSESSMENT / PLAN:  Metastatic lung adenocarcinoma Malignant right effusion, with trapped lung-now status post Pleurx drain on 10/13 Right atelectasis Thrombocytopenia   Discussion Feeling better post Pleurx placement.  Does have surgical site soreness which is expected currently on room air Portable chest x-ray personally reviewed continues to show lucency in right apex.  Suspect this represents trapped lung.  Has reaccumulation of pleural fluid and ongoing dense consolidation  Plan Mobilize We will move out of the intensive care, I brought her here just for monitoring during procedure Repeat chest x-ray in the morning on 10/15 We will drain Pleurx catheter on 10/15 to help determine volume of removal regimen, suspect we will drain her every other day  Erick Colace ACNP-BC [place] Pager # [phone] OR # [phone] if no answer  01/17/2019, 9:33 AM  She has 20% pneumothorax post drainage of pleural fluid, suspect that this indicates some degree of trapped lung.  She is tolerating this well and is not hypoxic. Pleurx catheter appears to be in good position Continue to drain twice a week  Marilyn [name] Soho MD

## 2019-01-17 NOTE — Progress Notes (Signed)
Physical Therapy Treatment Patient Details Name: Marilyn [name] MRN: [id number] DOB: 1966-01-19 Today's Date: 01/17/2019    History of Present Illness 53 year old female admitted for recurrent R pleural effusinon, r/o cholisthiasis  PMH:  metastatic lung CA, recurrent pleural effusions, chronic cholecystitis, DM., OA, CHF and asthma    PT Comments    Pt with some R flank pain secondary to pleurx catheter placement yesterday, but pt agreeable to OOB mobility. Pt ambulated short room distance with HHA and min assist for steadying, requiring very increased time for transfers and transition between activity due to deconditioning. Pt tolerated seated LE strengthening exercises well, PT to continue to follow to progress mobility as tolerated by pt.    Follow Up Recommendations  Home health PT;Supervision/Assistance - 24 hour;SNF(depending on progress and assistance available at home)     Equipment Recommendations  None recommended by PT    Recommendations for Other Services       Precautions / Restrictions Precautions Precautions: Fall Precaution Comments: monitor HR and O2 Restrictions Weight Bearing Restrictions: No    Mobility  Bed Mobility Overal bed mobility: Needs Assistance Bed Mobility: Rolling;Sidelying to Sit Rolling: Min assist Sidelying to sit: Min assist Supine to sit: Min assist     General bed mobility comments: min assist for rolling towards R side, trunk elevation to come to sitting. Increased time between sidelying and sitting due to pt reported pain.  Transfers Overall transfer level: Needs assistance Equipment used: 1 person hand held assist Transfers: Sit to/from [employer] Sit to Stand: Min guard Stand pivot transfers: Min assist       General transfer comment: close guard for pt safety and steadying if needed. Min assist for steadying for stand pivot to [facility].  Ambulation/Gait Ambulation/Gait assistance: Min assist;+2  safety/equipment Gait Distance (Feet): 8 Feet Assistive device: Rolling walker (2 wheeled) Gait Pattern/deviations: Decreased stride length;Step-through pattern;Trunk flexed Gait velocity: decr   General Gait Details: Min assist +2 for steadying, lines/leads management. SpO2 88-94% on RA with mobility, encouraging breathing technique for lower sats.   Stairs             Wheelchair Mobility    Modified Rankin (Stroke Patients Only)       Balance Overall balance assessment: Needs assistance Sitting-balance support: No upper extremity supported;Feet supported Sitting balance-Leahy Scale: Fair     Standing balance support: Bilateral upper extremity supported Standing balance-Leahy Scale: Poor                              Cognition Arousal/Alertness: Awake/alert Behavior During Therapy: WFL for tasks assessed/performed Overall Cognitive Status: Within Functional Limits for tasks assessed                                        Exercises General Exercises - Lower Extremity Long Arc Quad: AROM;Both;10 reps;Seated Hip Flexion/Marching: AROM;Both;10 reps;Seated    General Comments General comments (skin integrity, edema, etc.): sats 88-94 on RA      Pertinent Vitals/Pain Pain Assessment: Faces Faces Pain Scale: Hurts little more Pain Location: R side, new pleurx catheter Pain Descriptors / Indicators: Sore;Aching Pain Intervention(s): Limited activity within patient's tolerance;Monitored during session;Repositioned    Home Living                      Prior Function  PT Goals (current goals can now be found in the care plan section) Acute Rehab PT Goals Patient Stated Goal: none stated PT Goal Formulation: With patient Time For Goal Achievement: 02/02/19 Potential to Achieve Goals: Fair Progress towards PT goals: Progressing toward goals    Frequency    Min 3X/week      PT Plan Current plan remains  appropriate    Co-evaluation PT/OT/SLP Co-Evaluation/Treatment: Yes Reason for Co-Treatment: For patient/therapist safety PT goals addressed during session: Mobility/safety with mobility OT goals addressed during session: ADL's and self-care      AM-PAC PT "6 Clicks" Mobility   Outcome Measure  Help needed turning from your back to your side while in a flat bed without using bedrails?: A Little Help needed moving from lying on your back to sitting on the side of a flat bed without using bedrails?: A Little Help needed moving to and from a bed to a chair (including a wheelchair)?: A Little Help needed standing up from a chair using your arms (e.g., wheelchair or bedside chair)?: A Little Help needed to walk in hospital room?: A Little Help needed climbing 3-5 steps with a railing? : A Lot 6 Click Score: 17    End of Session Equipment Utilized During Treatment: Gait belt Activity Tolerance: Patient tolerated treatment well Patient left: in chair;with call bell/phone within reach;with chair alarm set Nurse Communication: Mobility status PT Visit Diagnosis: Unsteadiness on feet (R26.81);Muscle weakness (generalized) (M62.81)     Time: 5784-6962 PT Time Calculation (min) (ACUTE ONLY): 30 min  Charges:  $Therapeutic Activity: 8-22 mins                     Irini Leet Terrial Rhodes, PT Acute Rehabilitation Services Pager [phone]  Office [phone]    Evalynn Hankins D Nashay Brickley 01/17/2019, 3:56 PM

## 2019-01-17 NOTE — Telephone Encounter (Signed)
Faxed orders for [facility] signed by Dr.Kale to [place] [phone] - fax confirmation received

## 2019-01-17 NOTE — Progress Notes (Signed)
Occupational Therapy Treatment Patient Details Name: Marilyn [name] MRN: [id number] DOB: 12/21/1965 Today's Date: 01/17/2019    History of present illness 53 year old female admitted for recurrent R pleural effusinon, r/o cholisthiasis  PMH:  metastatic lung CA, recurrent pleural effusions, chronic cholecystitis, DM., OA, CHF and asthma, s/p pleurex cath   OT comments  Good participation in therapy today.  Used toilet and got up to chair. Fatiques easily, but she does initiate rest breaks and tends to work on her own time schedule. Sats 88-94% on RA  Follow Up Recommendations  SNF    Equipment Recommendations  3 in 1 bedside commode    Recommendations for Other Services      Precautions / Restrictions Precautions Precautions: Fall Precaution Comments: monitor HR and O2 Restrictions Weight Bearing Restrictions: No       Mobility Bed Mobility         Supine to sit: Min assist     General bed mobility comments: light assist for rolling for bed mobility  Transfers   Equipment used: 1 person hand held assist   Sit to Stand: Min guard         General transfer comment: able to stand and stabilize.  Min  hand held assist to step over to chair    Balance     Sitting balance-Leahy Scale: Fair                                     ADL either performed or assessed with clinical judgement   ADL Overall ADL's : Needs assistance/impaired                         Toilet Transfer: Min guard;Stand-pivot;BSC   Toileting- Clothing Manipulation and Hygiene: Moderate assistance;Sit to/from stand         General ADL Comments: pt tends to do activities on her own time schedule; extra time to get moving, but moved well.  Close guard for safety with SPT to bsc and assistance to stabilize bsc to avoid it moving     Vision       Perception     Praxis      Cognition Arousal/Alertness: Awake/alert Behavior During Therapy: WFL for tasks  assessed/performed Overall Cognitive Status: Within Functional Limits for tasks assessed                                          Exercises     Shoulder Instructions       General Comments sats 88-94 on RA    Pertinent Vitals/ Pain       Pain Assessment: Faces Faces Pain Scale: Hurts little more Pain Location: R side, new pleurx catheter Pain Intervention(s): Limited activity within patient's tolerance;Monitored during session;Repositioned  Home Living                                          Prior Functioning/Environment              Frequency  Min 2X/week        Progress Toward Goals  OT Goals(current goals can now be found in the care plan section)  Progress towards OT  goals: (goals upgraded today)  Acute Rehab OT Goals OT Goal Formulation: With patient Time For Goal Achievement: 01/31/19 Potential to Achieve Goals: Good ADL Goals Pt Will Transfer to Toilet: with min guard assist;ambulating;bedside commode Pt Will Perform Toileting - Clothing Manipulation and hygiene: with min guard assist;sit to/from stand Additional ADL Goal #1: pt will perform adl with min A, using AE as needed Additional ADL Goal #2: Pt will perform bed mobility with min guard from flat bed in preparation for adls  Plan      Co-evaluation    PT/OT/SLP Co-Evaluation/Treatment: Yes Reason for Co-Treatment: For patient/therapist safety PT goals addressed during session: Mobility/safety with mobility OT goals addressed during session: ADL's and self-care      AM-PAC OT "6 Clicks" Daily Activity     Outcome Measure   Help from another person eating meals?: A Little Help from another person taking care of personal grooming?: A Little Help from another person toileting, which includes using toliet, bedpan, or urinal?: A Lot Help from another person bathing (including washing, rinsing, drying)?: A Lot Help from another person to put on and taking  off regular upper body clothing?: A Lot Help from another person to put on and taking off regular lower body clothing?: A Lot 6 Click Score: 14    End of Session    OT Visit Diagnosis: Muscle weakness (generalized) (M62.81)   Activity Tolerance Patient tolerated treatment well   Patient Left in chair;with call bell/phone within reach;with chair alarm set   Nurse Communication          Time: 2130-8657 OT Time Calculation (min): 32 min  Charges: OT General Charges $OT Visit: 1 Visit OT Treatments $Self Care/Home Management : 8-22 mins  Lesle Chris, OTR/L Acute Rehabilitation Services [phone] [facility] pager [phone] office 01/17/2019   [place] 01/17/2019, 3:25 PM

## 2019-01-17 NOTE — Progress Notes (Signed)
Pt arrived on unit with all belongings. No c/o pain. No distress noted. Will continue to monitor.

## 2019-01-18 ENCOUNTER — Inpatient Hospital Stay (HOSPITAL_COMMUNITY): Payer: Medicare Other

## 2019-01-18 LAB — GLUCOSE, CAPILLARY
Glucose-Capillary: 107 mg/dL — ABNORMAL HIGH (ref 70–99)
Glucose-Capillary: 128 mg/dL — ABNORMAL HIGH (ref 70–99)
Glucose-Capillary: 140 mg/dL — ABNORMAL HIGH (ref 70–99)
Glucose-Capillary: 157 mg/dL — ABNORMAL HIGH (ref 70–99)
Glucose-Capillary: 166 mg/dL — ABNORMAL HIGH (ref 70–99)
Glucose-Capillary: 83 mg/dL (ref 70–99)
Glucose-Capillary: 91 mg/dL (ref 70–99)

## 2019-01-18 NOTE — Progress Notes (Signed)
   Name: Marilyn [name] MRN: [id number] DOB: [birth year]    ADMISSION DATE:  01/01/2019 CONSULTATION DATE:  01/18/2019  REFERRING MD :  Alfredia Ferguson, triad MD  CHIEF COMPLAINT: Dyspnea, abdominal pain  HISTORY OF PRESENT ILLNESS: 53 year old with metastatic lung adenocarcinoma and right malignant pleural effusion status thoracentesis x3.  We are consulted for recurrence of moderate right effusion and atelectasis of the right lung.    SIGNIFICANT EVENTS   Pleurx catheter placed on right side on 10/13 10/15: Pleurx drained at bedside 500 mL.  Stopped at that volume due to pleuritic discomfort  STUDIES:  9/7 RT thoracentesis >> 400 cc 9/29 RT thoracenteses -920 cc, positive adenocarcinoma 10/11 CT chest Moderate right pleural effusion with multifocal right-sided dependent pulmonary opacities, favored to represent compressive atelectasis.  Redemonstration of thoracic adenopathy   PAST MEDICAL HISTORY :   has a past medical history of Arthritis, Asthma, Diabetes mellitus, Hemorrhoids, Hypercholesteremia, Hypertension, Hypothyroidism, Metastatic lung cancer (metastasis from lung to other site) [facility]), Neuropathy, NICM (nonischemic cardiomyopathy) ([place]), Obese, Pneumonia (2017), and Sinus tachycardia.  has a past surgical history that includes Cesarean section; Tubal ligation; US guided core needle biopsy; ir generic historical (01/08/2016); ir generic historical (01/08/2016); IR FLUORO GUIDE PORT INSERTION RIGHT (02/18/2017); IR US Guide Vasc Access Right (02/18/2017); I/D  arm (Right, 2013); Colonoscopy; Multiple extractions with alveoloplasty (N/A, 03/25/2017); and IR THORACENTESIS ASP PLEURAL SPACE W/IMG GUIDE (05/25/2018).   SUBJECTIVE:  No acute distress however is a little more short of breath today VITAL SIGNS: Temp:  [97.3 F (36.3 C)-98.1 F (36.7 C)] 97.3 F (36.3 C) (10/15 1407) Pulse Rate:  [98-103] 103 (10/15 1407) Resp:  [16-18] 16 (10/15 1407) BP: (140-155)/(83-99) 155/99 (10/15  1407) SpO2:  [97 %-98 %] 98 % (10/15 1407) Weight:  [119.7 kg] 119.7 kg (10/15 0435)  PHYSICAL EXAMINATION: General this is an obese 53 year old black female she is resting comfortably in bed currently in no acute distress HEENT normocephalic atraumatic no jugular venous distention appreciated Pulmonary: Clear to auscultation diminished bases right Pleurx catheter dressing clean dry intact incision sites unremarkable Cardiac: Regular rate and rhythm without murmur rub or gallop Abdomen: Soft nontender no organomegaly Extremities: Warm dry brisk capillary refill Neuro: Awake oriented no focal deficits.   ASSESSMENT / PLAN:  Metastatic lung adenocarcinoma Malignant right effusion, with trapped lung-now status post Pleurx drain on 10/13 Right atelectasis Thrombocytopenia   Discussion Portable chest x-ray personally reviewed prior to Pleurx catheter placement demonstrated reaccumulation of right pleural fluid and persistent lucency in the right upper lobe reflecting trapped lung. She was reporting some increase in shortness of breath today, most likely explained by chest x-ray evaluated above I drained 500 mL of pleural fluid at bedside today from Pleurx catheter, we aborted further drainage of 500 mL's patient developed some pleuritic chest discomfort  Plan Will repeat CXR on Saturday am and plan to re-drain again on Saturday.  Suspect we will be aiming for about 500 ml or so.   Erick Colace ACNP-BC [place] Pager # [phone] OR # [phone] if no answer  01/18/2019, 2:51 PM

## 2019-01-18 NOTE — Progress Notes (Signed)
PROGRESS NOTE    Marilyn [name]  GEX:[id number] DOB: 08/30/1965 DOA: 01/01/2019 PCP: Diamantina Providence, FNP    Brief Narrative:  53 year old female who presented with abdominal pain.  She does have significant past medical history for metastatic adenocarcinoma, nonischemic cardiomyopathy, hypertension, hypothyroidism and malignant pleural effusions status post thoracentesis x3 the past.  Reported chronic abdominal pain that worsened about 3 days prior to hospitalization, no nausea no vomiting, no fevers no chills.  Patient has been receiving chemotherapy, last infusion December 27, 2018.  On her initial physical examination blood pressure 161/90, heart rate 132, respiratory 26, oxygen saturation 89%.  Heart S1-S2 present, rhythmic, lungs clear to auscultation bilaterally, abdomen soft, diffuse tenderness, no lower extremity edema.  CT of the abdomen with gallbladder contracted with gallbladder wall mildly thickened, common bile duct dilatation 12 mm, no apparent calculus.  CT chest with no pulmonary embolism, positive right pleural effusion with compressive atelectasis, complete collapse of the right middle and lower lobes.   Patient was admitted to the hospital with a working diagnosis of acute on chronic abdominal pain complicated by recurrent large right pleural effusion.  Further work-up with HIDA scan showed chronic cholecystitis.  She was deemed not a candidate for surgical intervention, by general surgery.  Interventional radiology was not able to place a cholecystostomy drain due to contracted gallbladder.  Patient was treated medically with antibiotics.  Throughout the course of her hospitalization she was diagnosed with ESBL E. coli urinary tract infection.    Due to persistent dyspnea and tachycardia, chest imaging was performed (CT and x ray) showing right recurrent pleural effusion, ultrasound-guided thoracentesis attempted but not much fluid to drain, pulmonary was consulted and  decision was made to insert a Pleurx catheter for drainage.  During her hospitalization she has required transfusion, 2 units packed red blood cells due to suspected acute blood loss anemia, related to hematuria.   Today patient with persistent plural effusion, drained 500 ml.   Assessment & Plan:   Active Problems:   Diabetes mellitus without complication (HCC)   Adenocarcinoma of left lung, stage 3 (HCC)   Port catheter in place   Sinus tachycardia   Hypothyroidism   Non-ischemic cardiomyopathy (HCC)   Essential hypertension   HLD (hyperlipidemia)   Depression   Chronic diastolic CHF (congestive heart failure) (HCC)   Recurrent right pleural effusion   Metastatic adenocarcinoma to lung, right (HCC)   Cholecystitis, acute   Biliary colic   Chemotherapy-induced neutropenia (HCC)   Urinary tract infection due to extended-spectrum beta lactamase (ESBL) producing Escherichia coli    1. Acute on chronic cholecystitis. Continue to tolerate po intake, with no nausea, vomiting, or abdominal pain.  Continue symptomatic control.  Outpatient follow-up.  2. Recurrent right malignant pleural effusion, successful per catheter placement, rapid fluid re-accumulation, drained 500 cc, will plan for follow up imaging in 48H and continue drain. If persistent rapid re-accumulation, consider drain to gravity instead clamping tube. Continue pain control and oxymetry monitoring.     3. Metastatic non small cell lung cancer.    Progressive disease, initially diagnosed with stage IIIb adenocarcinoma of the lung, she has been treated with chemotherapy and radiation therapy as an outpatient since 2017.  She also received immunotherapy.  Last treatment was in September 23.   4. Thrombocytopenia and acute blood loss anemia (hematuria).   Hemoglobin and hematocrit remained stable, hemoglobin 9.0, hematocrit 27.5  5. Acute on chronic diastolic heart failure.    Patient with obesity, difficult to  assess  volume status but clinically seems to be euvolemic.   6. Urine infection with ESBL E coli. No present on admission.  Patient has completed therapy with IV meropenem.  7. T2DM On insulin sliding scale for glucose coverage and monitoring, plus basal insulin with 20 units of glargine. Capillary glucose is well controlled.   8. Obesity.  Patient's BMI 39.3,.    DVT prophylaxis: enoxaparin   Code Status:  full Family Communication: no family at the bedside  Disposition Plan/ discharge barriers: pending clinical improvement, radiographic follow up of pleural effusion, possible dc over the weekend.     Body mass index is 38.97 kg/m. Malnutrition Type:  Nutrition Problem: Increased nutrient needs Etiology: chronic illness, cancer and cancer related treatments   Malnutrition Characteristics:  Signs/Symptoms: estimated needs   Nutrition Interventions:  Interventions: Boost Breeze, Magic cup, Prostat, MVI, Ensure Enlive (each supplement provides 350kcal and 20 grams of protein)  RN Pressure Injury Documentation:    Consultants:   Pulmonary   Procedures:   Pleural catheter  Antimicrobials:       Subjective: Patient is having mild chest pain at the site of the catheter, improved with analgesics dyspnea has improved but not yet back to baseline, no nausea or vomiting,   Objective: Vitals:   01/17/19 1513 01/17/19 1818 01/17/19 1942 01/18/19 0435  BP:   140/83 (!) 146/85  Pulse:  (!) 103 98 98  Resp:   18   Temp: (!) 97.4 F (36.3 C)  97.6 F (36.4 C) 98.1 F (36.7 C)  TempSrc: Oral   Oral  SpO2:   98% 97%  Weight:    119.7 kg  Height:        Intake/Output Summary (Last 24 hours) at 01/18/2019 1322 Last data filed at 01/18/2019 1000 Gross per 24 hour  Intake 240 ml  Output 450 ml  Net -210 ml   Filed Weights   01/04/19 1330 01/11/19 0515 01/18/19 0435  Weight: 119.9 kg 120.7 kg 119.7 kg    Examination:   General: Not in pain or dyspnea,  deconditioned  Neurology: Awake and alert, non focal  E ENT: mild pallor, no icterus, oral mucosa moist Cardiovascular: No JVD. S1-S2 present, rhythmic, no gallops, rubs, or murmurs. Trace lower extremity edema. Pulmonary: decreased reath sounds at the right, poor ventilation. Gastrointestinal. Abdomen protuberant, with no organomegaly, non tender, no rebound or guarding Skin. No rashes Musculoskeletal: no joint deformities     Data Reviewed: I have personally reviewed following labs and imaging studies  CBC: Recent Labs  Lab 01/13/19 1217 01/14/19 0313 01/15/19 0200 01/16/19 0400 01/17/19 0410  WBC 6.4 5.9 5.7 6.0 4.9  NEUTROABS 4.8 4.4 4.1 4.5 3.3  HGB 10.6* 9.4* 9.5* 9.8* 9.0*  HCT 31.9* 29.1* 29.3* 30.3* 27.5*  MCV 98.5 99.0 99.0 100.0 100.4*  PLT 23* 20* 78* 91* 93*   Basic Metabolic Panel: Recent Labs  Lab 01/13/19 1217 01/14/19 0313 01/15/19 0200 01/16/19 0400 01/17/19 0410  NA 139 138 140 141 138  K 3.6 3.7 3.5 3.6 4.1  CL 104 104 107 108 104  CO2 24 25 25 24 26   GLUCOSE 152* 271* 152* 152* 210*  BUN 9 9 6 7 10   CREATININE 0.72 0.84 0.73 0.73 0.77  CALCIUM 7.9* 8.0* 8.2* 8.2* 8.1*  MG 1.7 1.7 1.5* 1.7 1.6*  PHOS 6.2* 2.9 2.1* 2.3* 3.6   GFR: Estimated Creatinine Clearance: 115.1 mL/min (by C-G formula based on SCr of 0.77 mg/dL). Liver Function Tests: Recent  Labs  Lab 01/13/19 1217 01/14/19 0313 01/15/19 0200 01/16/19 0400 01/17/19 0410  AST 148* 116* 76* 58* 51*  ALT 67* 59* 50* 42 35  ALKPHOS 236* 239* 249* 245* 240*  BILITOT 1.0 0.4 0.4 0.4 0.3  PROT 6.6 6.4* 6.7 6.5 6.3*  ALBUMIN 2.4* 2.3* 2.4* 2.4* 2.3*   No results for input(s): LIPASE, AMYLASE in the last 168 hours. No results for input(s): AMMONIA in the last 168 hours. Coagulation Profile: No results for input(s): INR, PROTIME in the last 168 hours. Cardiac Enzymes: No results for input(s): CKTOTAL, CKMB, CKMBINDEX, TROPONINI in the last 168 hours. BNP (last 3 results) No  results for input(s): PROBNP in the last 8760 hours. HbA1C: No results for input(s): HGBA1C in the last 72 hours. CBG: Recent Labs  Lab 01/17/19 1943 01/18/19 0025 01/18/19 0431 01/18/19 0805 01/18/19 1131  GLUCAP 180* 91 83 107* 128*   Lipid Profile: No results for input(s): CHOL, HDL, LDLCALC, TRIG, CHOLHDL, LDLDIRECT in the last 72 hours. Thyroid Function Tests: No results for input(s): TSH, T4TOTAL, FREET4, T3FREE, THYROIDAB in the last 72 hours. Anemia Panel: No results for input(s): VITAMINB12, FOLATE, FERRITIN, TIBC, IRON, RETICCTPCT in the last 72 hours.    Radiology Studies: I have reviewed all of the imaging during this hospital visit personally     Scheduled Meds:  sodium chloride   Intravenous Once   atorvastatin  40 mg Oral q1800   Chlorhexidine Gluconate Cloth  6 each Topical Daily   docusate sodium  100 mg Oral BID   DULoxetine  60 mg Oral Daily   feeding supplement  1 Container Oral Q24H   feeding supplement (ENSURE ENLIVE)  237 mL Oral BID BM   feeding supplement (PRO-STAT SUGAR FREE 64)  30 mL Oral BID   folic acid  1 mg Oral Daily   insulin aspart  0-9 Units Subcutaneous Q4H   insulin glargine  20 Units Subcutaneous QAC breakfast   levothyroxine  150 mcg Oral QAC breakfast   mouth rinse  15 mL Mouth Rinse BID   metoprolol succinate  50 mg Oral QPM   metoprolol succinate  75 mg Oral Daily   multivitamin with minerals  1 tablet Oral Daily   pantoprazole  40 mg Oral Daily   polyethylene glycol  17 g Oral BID   senna-docusate  2 tablet Oral BID   vitamin B-12  1,000 mcg Oral Daily   Continuous Infusions:  sodium chloride     sodium chloride 250 mL (01/17/19 2023)     LOS: 16 days        Marilyn Kasparek Annett Gula, MD

## 2019-01-19 ENCOUNTER — Inpatient Hospital Stay (HOSPITAL_COMMUNITY): Payer: Medicare Other

## 2019-01-19 LAB — COMPREHENSIVE METABOLIC PANEL
ALT: 35 U/L (ref 0–44)
AST: 53 U/L — ABNORMAL HIGH (ref 15–41)
Albumin: 2.3 g/dL — ABNORMAL LOW (ref 3.5–5.0)
Alkaline Phosphatase: 195 U/L — ABNORMAL HIGH (ref 38–126)
Anion gap: 6 (ref 5–15)
BUN: 7 mg/dL (ref 6–20)
CO2: 28 mmol/L (ref 22–32)
Calcium: 8.5 mg/dL — ABNORMAL LOW (ref 8.9–10.3)
Chloride: 106 mmol/L (ref 98–111)
Creatinine, Ser: 0.67 mg/dL (ref 0.44–1.00)
GFR calc Af Amer: >60 mL/min (ref >60)
GFR calc non Af Amer: >60 mL/min (ref >60)
Glucose, Bld: 133 mg/dL — ABNORMAL HIGH (ref 70–99)
Potassium: 3.9 mmol/L (ref 3.5–5.1)
Sodium: 140 mmol/L (ref 135–145)
Total Bilirubin: 0.5 mg/dL (ref 0.3–1.2)
Total Protein: 6.7 g/dL (ref 6.5–8.1)

## 2019-01-19 LAB — CBC WITH DIFFERENTIAL/PLATELET
Abs Immature Granulocytes: 0.02 10*3/uL (ref 0.00–0.07)
Basophils Absolute: 0 10*3/uL (ref 0.0–0.1)
Basophils Relative: 0 %
Eosinophils Absolute: 0.2 10*3/uL (ref 0.0–0.5)
Eosinophils Relative: 4 %
HCT: 29.4 % — ABNORMAL LOW (ref 36.0–46.0)
Hemoglobin: 9.4 g/dL — ABNORMAL LOW (ref 12.0–15.0)
Immature Granulocytes: 0 %
Lymphocytes Relative: 14 %
Lymphs Abs: 0.6 10*3/uL — ABNORMAL LOW (ref 0.7–4.0)
MCH: 32.2 pg (ref 26.0–34.0)
MCHC: 32 g/dL (ref 30.0–36.0)
MCV: 100.7 fL — ABNORMAL HIGH (ref 80.0–100.0)
Monocytes Absolute: 0.6 10*3/uL (ref 0.1–1.0)
Monocytes Relative: 13 %
Neutro Abs: 3.1 10*3/uL (ref 1.7–7.7)
Neutrophils Relative %: 69 %
Platelets: 131 10*3/uL — ABNORMAL LOW (ref 150–400)
RBC: 2.92 MIL/uL — ABNORMAL LOW (ref 3.87–5.11)
RDW: 17.1 % — ABNORMAL HIGH (ref 11.5–15.5)
WBC: 4.5 10*3/uL (ref 4.0–10.5)
nRBC: 0 % (ref 0.0–0.2)

## 2019-01-19 LAB — GLUCOSE, CAPILLARY
Glucose-Capillary: 107 mg/dL — ABNORMAL HIGH (ref 70–99)
Glucose-Capillary: 106 mg/dL — ABNORMAL HIGH (ref 70–99)
Glucose-Capillary: 195 mg/dL — ABNORMAL HIGH (ref 70–99)
Glucose-Capillary: 144 mg/dL — ABNORMAL HIGH (ref 70–99)
Glucose-Capillary: 143 mg/dL — ABNORMAL HIGH (ref 70–99)

## 2019-01-19 LAB — MAGNESIUM: Magnesium: 1.4 mg/dL — ABNORMAL LOW (ref 1.7–2.4)

## 2019-01-19 MED ORDER — MAGNESIUM SULFATE 2 GM/50ML IV SOLN
2.0000 g | Freq: Once | INTRAVENOUS | Status: AC
  Administered 2019-01-19: 2 g via INTRAVENOUS
  Filled 2019-01-19: qty 50

## 2019-01-19 MED ORDER — HYDRALAZINE HCL 20 MG/ML IJ SOLN
10.0000 mg | Freq: Four times a day (QID) | INTRAMUSCULAR | Status: DC | PRN
  Administered 2019-01-20: 10 mg via INTRAVENOUS
  Filled 2019-01-19: qty 1

## 2019-01-19 NOTE — Progress Notes (Addendum)
We will be available over the weekend as needed  Would drain pleural fluid bid (Monday and Thursday) Volume to be drained as tolerated, stop if patient develops discomfort  Follow-up appointment arranged for 10/27 with Dr. Tamala Julian this is been placed in the computer   Erick Colace ACNP-BC [place] Pager # [phone] OR # [phone] if no answer

## 2019-01-19 NOTE — Progress Notes (Signed)
Physical Therapy Treatment Patient Details Name: Marilyn [name] MRN: [id number] DOB: 02/03/1966 Today's Date: 01/19/2019    History of Present Illness 53 year old female admitted for recurrent R pleural effusinon, r/o cholisthiasis  PMH:  metastatic lung CA, recurrent pleural effusions, chronic cholecystitis, DM., OA, CHF and asthma    PT Comments    Pt ambulating short distances with RW. Reports she has rollator at home.  Discussed d/c with her and she has an aide 7 days/ week for 2.5 hours/day.  She was unsure if she could arrange 24 hour S with family.  She does want to go home rather than SNF.  Encouraged pt to talk to family to see how much help they can provide. Will need HHPT if refuses SNF.   Follow Up Recommendations  Home health PT;Supervision/Assistance - 24 hour;SNF(Pt will likely refuse SNF)     Equipment Recommendations  None recommended by PT    Recommendations for Other Services       Precautions / Restrictions Precautions Precautions: Fall Precaution Comments: monitor HR and O2 Restrictions Weight Bearing Restrictions: No    Mobility  Bed Mobility Overal bed mobility: Needs Assistance Bed Mobility: Rolling;Sidelying to Sit Rolling: Min guard Sidelying to sit: Min guard       General bed mobility comments: MIN/guard, but heavy use of rail.  Transfers Overall transfer level: Needs assistance Equipment used: Rolling walker (2 wheeled) Transfers: Sit to/from Stand Sit to Stand: Min guard;From elevated surface         General transfer comment: min/guard for steadying from elevated surface  Ambulation/Gait Ambulation/Gait assistance: Min guard;Min assist;+2 safety/equipment Gait Distance (Feet): 10 Feet Assistive device: Rolling walker (2 wheeled) Gait Pattern/deviations: Decreased stride length;Step-through pattern;Trunk flexed Gait velocity: decreased   General Gait Details: MIN to MIN/guard for gait in room.  Encouraged to attempt more, but pt  declined.   Stairs             Wheelchair Mobility    Modified Rankin (Stroke Patients Only)       Balance Overall balance assessment: Needs assistance Sitting-balance support: No upper extremity supported;Feet supported Sitting balance-Leahy Scale: Fair     Standing balance support: Bilateral upper extremity supported Standing balance-Leahy Scale: Poor Standing balance comment: requires UE support                            Cognition Arousal/Alertness: Awake/alert Behavior During Therapy: WFL for tasks assessed/performed Overall Cognitive Status: Within Functional Limits for tasks assessed                                 General Comments: Takes breaks and requires increased time, but worked well with PT      Exercises      General Comments        Pertinent Vitals/Pain Pain Assessment: Faces Faces Pain Scale: Hurts little more Pain Location: R side Pain Descriptors / Indicators: Grimacing;Sore Pain Intervention(s): Limited activity within patient's tolerance;Monitored during session;Repositioned    Home Living                      Prior Function            PT Goals (current goals can now be found in the care plan section) Acute Rehab PT Goals Patient Stated Goal: go home PT Goal Formulation: With patient Time For Goal Achievement: 02/02/19  Potential to Achieve Goals: Fair Progress towards PT goals: Progressing toward goals    Frequency    Min 3X/week      PT Plan Current plan remains appropriate    Co-evaluation              AM-PAC PT "6 Clicks" Mobility   Outcome Measure  Help needed turning from your back to your side while in a flat bed without using bedrails?: A Little Help needed moving from lying on your back to sitting on the side of a flat bed without using bedrails?: A Little Help needed moving to and from a bed to a chair (including a wheelchair)?: A Little Help needed standing up  from a chair using your arms (e.g., wheelchair or bedside chair)?: A Little Help needed to walk in hospital room?: A Little Help needed climbing 3-5 steps with a railing? : A Lot 6 Click Score: 17    End of Session Equipment Utilized During Treatment: Gait belt Activity Tolerance: Patient tolerated treatment well Patient left: in chair;with call bell/phone within reach;with chair alarm set   PT Visit Diagnosis: Unsteadiness on feet (R26.81);Muscle weakness (generalized) (M62.81)     Time: 1308-6578 PT Time Calculation (min) (ACUTE ONLY): 22 min  Charges:  $Gait Training: 8-22 mins                     Marilyn [name] L. Marilyn [name], [place] Pager [phone] 01/19/2019    Marilyn [name] 01/19/2019, 12:15 PM

## 2019-01-19 NOTE — Progress Notes (Signed)
PROGRESS NOTE    Marilyn [name]  OHY:[id number] DOB: [birth year] DOA: 01/01/2019 PCP: [name], FNP    Brief Narrative:  53 year old female who presented with abdominal pain.  She does have significant past medical history for metastatic adenocarcinoma, nonischemic cardiomyopathy, hypertension, hypothyroidism and malignant pleural effusions status post thoracentesis x3 the past. Reported chronic abdominal pain that worsened about 3 days prior to hospitalization, no nausea no vomiting, no fevers no chills.  Patient has been receiving chemotherapy, last infusion December 27, 2018. CT of the abdomen with gallbladder contracted with gallbladder wall mildly thickened, common bile duct dilatation 12 mm, no apparent calculus.  CT chest with no pulmonary embolism, positive right pleural effusion with compressive atelectasis, complete collapse of the right middle and lower lobes. Patient was admitted to the hospital with a working diagnosis of acute on chronic abdominal pain complicated by recurrent large right pleural effusion. Further work-up with HIDA scan showed chronic cholecystitis.  She was deemed not a candidate for surgical intervention, by general surgery.  Interventional radiology was not able to place a cholecystostomy drain due to contracted gallbladder.  Patient was treated medically with antibiotics. Due to persistent dyspnea and tachycardia, chest imaging was performed (CT and x ray) showing right recurrent pleural effusion, ultrasound-guided thoracentesis attempted but not much fluid to drain, pulmonary was consulted and decision was made to insert a Pleurx catheter for drainage. During her hospitalization she has required transfusion, 2 units packed red blood cells due to suspected acute blood loss anemia, related to hematuria.    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, right (HCC)   Cholecystitis, acute   Biliary colic   Chemotherapy-induced neutropenia (HCC)   Urinary tract infection due to extended-spectrum beta lactamase (ESBL) producing Escherichia coli   Acute on chronic cholecystitis Currently afebrile, with no leukocytosis Continue symptomatic control Outpatient follow-up  Recurrent right malignant pleural effusion s/p Pleurx catheter placement Currently saturating well on room air PCCM on board, plan to drain Pleurx twice a week Patient will be discharged with home health services  Hypomagnesemia Replace as needed  Hypertension Uncontrolled, likely worsened by pain Continue metoprolol, hydralazine as needed  Diabetes mellitus type 2 Controlled Continue SSI, glargine, Accu-Cheks, hypoglycemic protocol  Thrombocytopenia and acute blood loss anemia (hematuria) Stable Daily CBC  ESBL E coli UTI Patient has completed therapy with IV meropenem  Metastatic non small cell lung cancer Progressive disease, initially diagnosed with stage IIIb adenocarcinoma of the lung Treated with chemotherapy and radiation therapy as an outpatient since 2017 Received immunotherapy  Obesity Advised lifestyle modification   DVT prophylaxis:  SCDs  Code Status:  full Family Communication: no family at the bedside  Disposition Plan/ discharge barriers: pending clinical improvement, likely DC on 01/20/2019    Body mass index is 38.97 kg/m. Malnutrition Type:  Nutrition Problem: Increased nutrient needs Etiology: chronic illness, cancer and cancer related treatments   Malnutrition Characteristics:  Signs/Symptoms: estimated needs   Nutrition Interventions:   Interventions: Boost Breeze, Magic cup, Prostat, MVI, Ensure Enlive (each supplement provides 350kcal and 20 grams of protein)  RN Pressure Injury Documentation:  Consultants:   Pulmonary   Procedures:   Pleural catheter  Antimicrobials:   Currently none   Subjective: Patient seen and examined at bedside, denies any new complaints, still complaining of generalized abdominal pain and pain around the Pleurx catheter site.  Denies any chest pain, worsening shortness of breath, fever/chills, cough  Objective: Vitals:   01/18/19 1407 01/18/19 2030 01/19/19 0551 01/19/19 1413  BP: (!) 155/99 (!) 165/96 (!) 170/98 (!) 151/74  Pulse: (!) 103 (!) 104 93 94  Resp: 16 20 18  (!) 22  Temp: (!) 97.3 F (36.3 C) 98.5 F (36.9 C) 98.2 F (36.8 C) 98.6 F (37 C)  TempSrc: Oral  Oral Oral  SpO2: 98% 98% 98% 98%  Weight:      Height:        Intake/Output Summary (Last 24 hours) at 01/19/2019 1428 Last data filed at 01/19/2019 1413 Gross per 24 hour  Intake 1813.39 ml  Output 2000 ml  Net -186.61 ml   Filed Weights   01/04/19 1330 01/11/19 0515 01/18/19 0435  Weight: 119.9 kg 120.7 kg 119.7 kg    Examination:   General: NAD, deconditioned  Cardiovascular: S1, S2 present  Respiratory:  Decreased breath sounds at the right  Abdomen: Soft, mild generalized tenderness, nondistended, bowel sounds present  Musculoskeletal: No bilateral pedal edema noted  Skin: Normal  Psychiatry: Normal mood     Data Reviewed: I have personally reviewed following labs and imaging studies  CBC: Recent Labs  Lab 01/14/19 0313 01/15/19 0200 01/16/19 0400 01/17/19 0410 01/19/19 1057  WBC 5.9 5.7 6.0 4.9 4.5  NEUTROABS 4.4 4.1 4.5 3.3 3.1  HGB 9.4* 9.5* 9.8* 9.0* 9.4*  HCT 29.1* 29.3* 30.3* 27.5* 29.4*  MCV 99.0 99.0 100.0 100.4* 100.7*  PLT 20* 78* 91* 93* 286*   Basic Metabolic Panel: Recent Labs  Lab 01/13/19 1217 01/14/19 0313 01/15/19 0200 01/16/19 0400 01/17/19  0410 01/19/19 1056 01/19/19 1057  NA 139 138 140 141 138 140  --   K 3.6 3.7 3.5 3.6 4.1 3.9  --   CL 104 104 107 108 104 106  --   CO2 24 25 25 24 26 28   --   GLUCOSE 152* 271* 152* 152* 210* 133*  --   BUN 9 9 6 7 10 7   --   CREATININE 0.72 0.84 0.73 0.73 0.77 0.67  --   CALCIUM 7.9* 8.0* 8.2* 8.2* 8.1* 8.5*  --   MG 1.7 1.7 1.5* 1.7 1.6*  --  1.4*  PHOS 6.2* 2.9 2.1* 2.3* 3.6  --   --    GFR: Estimated Creatinine Clearance: 115.1 mL/min (by C-G formula based on SCr of 0.67 mg/dL). Liver Function Tests: Recent Labs  Lab 01/14/19 0313 01/15/19 0200 01/16/19 0400 01/17/19 0410 01/19/19 1056  AST 116* 76* 58* 51* 53*  ALT 59* 50* 42 35 35  ALKPHOS 239* 249* 245* 240* 195*  BILITOT 0.4 0.4 0.4 0.3 0.5  PROT 6.4* 6.7 6.5 6.3* 6.7  ALBUMIN 2.3* 2.4* 2.4* 2.3* 2.3*   No results for input(s): LIPASE, AMYLASE in the last 168 hours. No results for input(s): AMMONIA in the last 168 hours. Coagulation Profile: No results for input(s): INR, PROTIME in the last 168 hours. Cardiac Enzymes: No results for input(s): CKTOTAL, CKMB, CKMBINDEX, TROPONINI in the last 168 hours. BNP (last 3 results) No results for input(s): PROBNP in the last 8760 hours. HbA1C: No results for input(s): HGBA1C in the last 72 hours. CBG: Recent Labs  Lab 01/18/19 2026 01/18/19 2353 01/19/19 0415 01/19/19 0806 01/19/19 1222  GLUCAP 166* 157* 107* 106* 195*   Lipid Profile: No results for input(s): CHOL, HDL, LDLCALC, TRIG, CHOLHDL, LDLDIRECT in the last 72 hours. Thyroid Function Tests: No results for input(s): TSH, T4TOTAL, FREET4, T3FREE, THYROIDAB in the last 72 hours. Anemia Panel: No results for input(s): VITAMINB12, FOLATE, FERRITIN, TIBC, IRON, RETICCTPCT in the last 72 hours.    Radiology Studies: I have reviewed all of the imaging during this hospital visit personally     Scheduled Meds: . sodium chloride   Intravenous Once  . atorvastatin  40 mg Oral q1800  . Chlorhexidine  Gluconate Cloth  6 each Topical Daily  . docusate sodium  100 mg Oral BID  . DULoxetine  60 mg Oral Daily  . feeding supplement  1 Container Oral Q24H  . feeding supplement (ENSURE ENLIVE)  237 mL Oral BID BM  . feeding supplement (PRO-STAT SUGAR FREE 64)  30 mL Oral BID  . folic acid  1 mg Oral Daily  . insulin aspart  0-9 Units Subcutaneous Q4H  . insulin glargine  20 Units Subcutaneous QAC breakfast  . levothyroxine  150 mcg Oral QAC breakfast  . mouth rinse  15 mL Mouth Rinse BID  . metoprolol succinate  50 mg Oral QPM  . metoprolol succinate  75 mg Oral Daily  . multivitamin with minerals  1 tablet Oral Daily  . pantoprazole  40 mg Oral Daily  . polyethylene glycol  17 g Oral BID  . senna-docusate  2 tablet Oral BID  . vitamin B-12  1,000 mcg Oral Daily   Continuous Infusions: . sodium chloride 10 mL/hr at 01/19/19 0600  . sodium chloride 10 mL/hr at 01/18/19 2032     LOS: 17 days        Alma Friendly, MD

## 2019-01-20 LAB — BASIC METABOLIC PANEL
Anion gap: 9 (ref 5–15)
BUN: 6 mg/dL (ref 6–20)
CO2: 27 mmol/L (ref 22–32)
Calcium: 8.4 mg/dL — ABNORMAL LOW (ref 8.9–10.3)
Chloride: 103 mmol/L (ref 98–111)
Creatinine, Ser: 0.59 mg/dL (ref 0.44–1.00)
GFR calc Af Amer: >60 mL/min (ref >60)
GFR calc non Af Amer: >60 mL/min (ref >60)
Glucose, Bld: 122 mg/dL — ABNORMAL HIGH (ref 70–99)
Potassium: 3.6 mmol/L (ref 3.5–5.1)
Sodium: 139 mmol/L (ref 135–145)

## 2019-01-20 LAB — GLUCOSE, CAPILLARY
Glucose-Capillary: 110 mg/dL — ABNORMAL HIGH (ref 70–99)
Glucose-Capillary: 115 mg/dL — ABNORMAL HIGH (ref 70–99)
Glucose-Capillary: 146 mg/dL — ABNORMAL HIGH (ref 70–99)
Glucose-Capillary: 159 mg/dL — ABNORMAL HIGH (ref 70–99)
Glucose-Capillary: 180 mg/dL — ABNORMAL HIGH (ref 70–99)

## 2019-01-20 LAB — MAGNESIUM: Magnesium: 1.7 mg/dL (ref 1.7–2.4)

## 2019-01-20 MED ORDER — CYANOCOBALAMIN 1000 MCG PO TABS: 1000.0000 ug | ORAL_TABLET | Freq: Every day | ORAL | 0 refills | Status: AC

## 2019-01-20 MED ORDER — POLYETHYLENE GLYCOL 3350 17 G PO PACK: 17.0000 g | PACK | Freq: Two times a day (BID) | ORAL | 0 refills | Status: AC

## 2019-01-20 MED ORDER — OXYCODONE HCL 5 MG PO TABS: 5.0000 mg | ORAL_TABLET | Freq: Four times a day (QID) | ORAL | 0 refills | Status: AC | PRN

## 2019-01-20 MED ORDER — HEPARIN SOD (PORK) LOCK FLUSH 100 UNIT/ML IV SOLN
500.0000 [IU] | INTRAVENOUS | Status: AC | PRN
  Administered 2019-01-20: 500 [IU]

## 2019-01-20 NOTE — Progress Notes (Signed)
Occupational Therapy Treatment Patient Details Name: Marilyn [name] MRN: [id number] DOB: [birth year] Today's Date: 01/20/2019    History of present illness 53 year old female admitted for recurrent R pleural effusinon, r/o cholisthiasis  PMH:  metastatic lung CA, recurrent pleural effusions, chronic cholecystitis, DM., OA, CHF and asthma   OT comments  Pt plans to go home this day.  Pt has needed A and DME.  Hospital bed will be delivered this day.    Follow Up Recommendations  Home health OT;Supervision/Assistance - 24 hour          Precautions / Restrictions Precautions Precautions: Fall Precaution Comments: monitor HR and O2 Restrictions Weight Bearing Restrictions: No       Mobility Bed Mobility               General bed mobility comments: pt in chair  Transfers Overall transfer level: Needs assistance Equipment used: Rolling walker (2 wheeled) Transfers: Sit to/from [facility] Sit to Stand: Min guard Stand pivot transfers: Min guard       General transfer comment: VC for hand placement    Balance Overall balance assessment: Needs assistance Sitting-balance support: No upper extremity supported;Feet supported Sitting balance-Leahy Scale: Fair     Standing balance support: Bilateral upper extremity supported Standing balance-Leahy Scale: Poor Standing balance comment: requires UE support                           ADL either performed or assessed with clinical judgement   ADL Overall ADL's : Needs assistance/impaired     Grooming: Wash/dry hands;Standing                   Toilet Transfer: Min guard;Stand-pivot;BSC   Toileting- Clothing Manipulation and Hygiene: Sit to/from stand;Minimal assistance         General ADL Comments: pt plans to Dc home this day with A from family. Pt states hospital bed will arrive between 4 and 6.     Vision Patient Visual Report: No change from baseline             Cognition Arousal/Alertness: Awake/alert Behavior During Therapy: WFL for tasks assessed/performed Overall Cognitive Status: Within Functional Limits for tasks assessed                                                     Pertinent Vitals/ Pain       Pain Assessment: No/denies pain      Progress Toward Goals  OT Goals(current goals can now be found in the care plan section)  Progress towards OT goals: Progressing toward goals     Plan Discharge plan needs to be updated       AM-PAC OT "6 Clicks" Daily Activity     Outcome Measure   Help from another person eating meals?: None Help from another person taking care of personal grooming?: None Help from another person toileting, which includes using toliet, bedpan, or urinal?: A Little Help from another person bathing (including washing, rinsing, drying)?: A Little Help from another person to put on and taking off regular upper body clothing?: None Help from another person to put on and taking off regular lower body clothing?: A Little 6 Click Score: 21    End of Session Equipment Utilized During Treatment: Rolling  walker  OT Visit Diagnosis: Muscle weakness (generalized) (M62.81)   Activity Tolerance Patient tolerated treatment well   Patient Left in chair;with call bell/phone within reach   Nurse Communication Mobility status        Time: 0315-0332 OT Time Calculation (min): 17 min  Charges: OT General Charges $OT Visit: 1 Visit OT Treatments $Self Care/Home Management : 8-22 mins  Kari Baars, [place] [phone] Office- [place], Edwena Felty D 01/20/2019, 3:40 PM

## 2019-01-20 NOTE — Discharge Summary (Addendum)
Discharge Summary  Marilyn [name] SEG:[id number] DOB: May 16, 1965  PCP: [name], FNP  Admit date: 01/01/2019 Discharge date: 01/20/2019  Time spent: 40 mins  Recommendations for Outpatient Follow-up:  1. Follow-up with PCP in 1 week 2. Follow-up with pulmonary on 01/30/2019 3. 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    Discharge Condition: Stable  Diet recommendation: Heart healthy/mod carb  Vitals:   01/20/19 0015 01/20/19 0428  BP: (!) 163/100 (!) 163/78  Pulse: 92 (!) 102  Resp:  19  Temp:  98.5 F (36.9 C)  SpO2:  99%    History of present illness:  53 year old female who presented with abdominal pain. She does have significant past medical history for metastatic adenocarcinoma,nonischemic cardiomyopathy, hypertension, hypothyroidism and malignant pleural effusions status post thoracentesis x3 the past. Reported chronic abdominal pain that worsened about 3 days prior to hospitalization, no nausea no vomiting, no fevers no chills. Patient has been receiving chemotherapy, last infusion December 27, 2018. CT of the abdomen with  gallbladder contracted with gallbladder wall mildly thickened, common bile duct dilatation 12 mm, no apparent calculus.CT chest with no pulmonary embolism, positive right pleural effusion with compressive atelectasis, complete collapse of the right middle and lower lobes. Patient was admitted to the hospital with a working diagnosis of acute on chronic abdominal pain complicated by recurrent large right pleural effusion. Further work-up with HIDA scan showed chronic cholecystitis. She was deemed not a candidate for surgical intervention,by general surgery. Interventional radiology was not able to place a cholecystostomy drain due to contracted gallbladder. Patient was treated medically with antibiotics. Due to persistent dyspnea and tachycardia, chest imaging was performed (CT and x ray) showing right recurrent pleural effusion, ultrasound-guided thoracentesis attempted but not much fluid to drain, pulmonary was consulted and decision was made to insert a Pleurx catheter for drainage. During her hospitalization she has required transfusion, 2 units packed red blood cells due to suspected acute blood loss anemia,related tohematuria.    Patient seen and examined at bedside today, reports feeling much better.  Reports pain around the Pleurx, but tolerable.  Denies worsening abdominal pain, denies any nausea/vomiting, fever/chills, diarrhea, cough.  Patient recommended to discharge to SNF for rehab needs, but patient declined, stating she has 24-hour care with family members stopping by to check up on her.  Agreeable for home health RN, PT/OT, aide, respiratory care.   Patient will also need a hospital bed, as she does have orthopnea (difficulty breathing when laying flat), due to her recurrent malignant pleural effusion, in addition to CHF.  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, right (HCC)   Cholecystitis, acute   Biliary colic   Chemotherapy-induced neutropenia (HCC)   Urinary tract infection due to extended-spectrum beta lactamase (ESBL) producing Escherichia coli  Acute on chronic cholecystitis Currently afebrile, with no leukocytosis Outpatient follow-up with general surgery  Recurrent right malignant pleural effusion s/p Pleurx catheter placement Currently saturating well on room air PCCM on board, recommend to drain Pleurx twice a week (Monday and Thursdays), volume to be drained as tolerated, stop if patient develops discomfort Follow-up appointment arranged for 10/27 with Dr. Tamala Julian with pulmonary Patient will be discharged with home health services, and RN to assist in draining Pleurx.  Hypomagnesemia Replace as needed Follow-up with PCP with repeat labs  Hypertension Stable Continue metoprolol Follow-up with PCP  Diabetes mellitus type 2 Controlled Continue home regimen  Thrombocytopenia and acute blood loss anemia (hematuria) Stable Follow-up with oncology  ESBL E coli UTI Patient has completed therapy with IV meropenem  Metastatic non small cell lung cancer Progressive disease, initially diagnosed with stage IIIb adenocarcinoma of the lung Treated with chemotherapy and radiation therapy as an outpatient since 2017 Received immunotherapy Follow-up with oncology  Obesity Advised lifestyle modification            Malnutrition Type:  Nutrition Problem: Increased nutrient needs Etiology: chronic illness, cancer and cancer related treatments   Malnutrition Characteristics:  Signs/Symptoms: estimated  needs   Nutrition Interventions:  Interventions: Boost Breeze, Magic cup, Prostat, MVI, Ensure Enlive (each supplement provides 350kcal and 20 grams of protein)   Estimated body mass index is 38.97 kg/m as calculated from the following:   Height as of this encounter: '5\' 9"'$  (1.753 m).   Weight as of this encounter: 119.7 kg.    Procedures:  Pleurx catheter placement  Consultations:  Pulmonary  Discharge Exam: BP (!) 163/78 (BP Location: Right Arm)    Pulse (!) 102    Temp 98.5 F (36.9 C) (Oral)    Resp 19    Ht '5\' 9"'$  (1.753 m)    Wt 119.7 kg    LMP 01/21/2016 Comment: pt signed preg test waiver 04/16/16    SpO2 99%    BMI 38.97 kg/m   General: NAD Cardiovascular: S1, S2 present Respiratory: Decreased breath sounds on the right  Discharge Instructions You were cared for by a hospitalist during your hospital stay. If you have any questions about your discharge medications or the care you received while you were in the hospital after you are discharged, you can call the unit and asked to speak with the hospitalist on call if the hospitalist that took care of you is not available. Once you are discharged, your primary care physician will handle any further medical issues. Please note that NO REFILLS for any discharge medications will be authorized once you are discharged, as it is imperative that you return to your primary care physician (or establish a relationship with a primary care physician if you do not have one) for your aftercare needs so that they can reassess your need for medications and monitor your lab values.  Discharge Instructions    Home infusion instructions [facility] May follow [place] Dosing Protocol; May administer Cathflo as needed to maintain patency of  vascular access device.; Flushing of vascular access device: per [facility] Protocol: 0.9% NaCl pre/post medica...   Complete by: As directed    Instructions: May follow [place] Dosing Protocol    Instructions: May administer Cathflo as needed to maintain patency of vascular access device.   Instructions: Flushing of vascular access device: per [facility] Protocol: 0.9% NaCl pre/post medication administration and prn patency; Heparin 100 u/ml, 46m for implanted ports and Heparin 10u/ml, 569mfor all other central venous catheters.   Instructions: May follow [facility] Anaphylaxis Protocol for First Dose Administration in the home: 0.9% NaCl at 25-50 ml/hr to maintain IV access for protocol meds. Epinephrine 0.3 ml IV/IM PRN and Benadryl 25-50 IV/IM PRN s/s of anaphylaxis.   Instructions: [facility] Coordinator (RN) to assist per patient IV care needs in the home PRN.     Allergies as of 01/20/2019   No Known Allergies     Medication List    TAKE these medications   Accu-Chek Aviva Plus test strip Generic drug: glucose blood 1 each by Other route 3 (three) times daily.   Aspirin Low Dose 81 MG EC tablet Generic drug: aspirin TAKE 1 TABLET BY MOUTH EVERY DAY What changed:   how much to take  additional instructions   atorvastatin 40 MG tablet Commonly known as: LIPITOR Take 1 tablet (40 mg total) by mouth daily at 6 PM.   cyanocobalamin 1000 MCG tablet Take 1 tablet (1,000 mcg total) by mouth daily. Start taking on: January 21, 2019   dexamethasone 4 MG tablet Commonly known as: DECADRON Take 1 tab two times a day the day before Alimta chemo. Take 2 tabs two times a day starting the day after chemo for 3 days. What changed:   how much to take  how to take this  when to take this  additional instructions   diclofenac sodium 1 % Gel Commonly known as: VOLTAREN Apply 2 g topically 2 (two) times daily as needed (for chestwall pain from costochondiritis).   DULoxetine 60 MG capsule Commonly known as: CYMBALTA TAKE 1 CAPSULE BY MOUTH EVERY DAY What changed: how much to take   feeding supplement (ENSURE ENLIVE) Liqd Take 237 mLs by mouth 2 (two) times daily  between meals.   folic acid 1 MG tablet Commonly known as: FOLVITE Take 1 tablet (1 mg total) by mouth daily. Start 5-7 days before Alimta chemotherapy. Continue until 21 days after Alimta completed.   glucose monitoring kit monitoring kit 1 each by Does not apply route as needed for other.   hydrOXYzine 10 MG tablet Commonly known as: ATARAX/VISTARIL Take 1 tablet (10 mg total) by mouth 3 (three) times daily as needed for itching.   insulin aspart 100 UNIT/ML FlexPen Commonly known as: NOVOLOG As per sliding scale instructions What changed:   how much to take  how to take this  when to take this   levalbuterol 45 MCG/ACT inhaler Commonly known as: XOPENEX HFA Inhale 1-2 puffs into the lungs every 8 (eight) hours as needed for wheezing.   levothyroxine 150 MCG tablet Commonly known as: SYNTHROID TAKE 1 TABLET BY MOUTH EVERY DAY BEFORE BREAKFAST What changed:   how much to take  how to take this  when to take this  additional instructions   lidocaine-prilocaine cream Commonly known as: EMLA Apply 1 application topically as needed. What changed: reasons to take this   LORazepam 0.5 MG tablet Commonly known as: ATIVAN Take 1 tablet (0.5 mg total) by  mouth every 6 (six) hours as needed (Nausea or vomiting).   magnesium oxide 400 (241.3 Mg) MG tablet Commonly known as: MAG-OX Take 1 tablet (400 mg total) by mouth 2 (two) times daily.   metoprolol succinate 50 MG 24 hr tablet Commonly known as: TOPROL-XL Take 1 tablet ('50mg'$ ) by mouth in the morning and 1/2 tablet ('25mg'$ ) in the evening. Take with or immediately following a meal. What changed:   how much to take  how to take this  when to take this  additional instructions   ondansetron 8 MG tablet Commonly known as: Zofran Take 1 tablet (8 mg total) by mouth 2 (two) times daily as needed for refractory nausea / vomiting. Start on day 3 after chemo.   oxyCODONE 5 MG immediate release tablet Commonly  known as: Oxy IR/ROXICODONE Take 1 tablet (5 mg total) by mouth every 6 (six) hours as needed for up to 5 days for severe pain. What changed: when to take this   pantoprazole 40 MG tablet Commonly known as: Protonix Take 1 tablet (40 mg total) by mouth daily.   polyethylene glycol 17 g packet Commonly known as: MIRALAX / GLYCOLAX Take 17 g by mouth 2 (two) times daily.   senna-docusate 8.6-50 MG tablet Commonly known as: Senna S Take 2 tablets by mouth at bedtime.   Tyler Aas FlexTouch 200 UNIT/ML Sopn Generic drug: Insulin Degludec Inject 100 Units into the skin daily before breakfast.   Vitamin A & D Oint Apply 1 application topically 2 (two) times daily. What changed:   when to take this  reasons to take this            Home Infusion Instuctions  (From admission, onward)         Start     Ordered   01/14/19 0000  Home infusion instructions [facility] May follow [place] Dosing Protocol; May administer Cathflo as needed to maintain patency of vascular access device.; Flushing of vascular access device: per [facility] Protocol: 0.9% NaCl pre/post medica...    Question Answer Comment  Instructions May follow [place] Dosing Protocol   Instructions May administer Cathflo as needed to maintain patency of vascular access device.   Instructions Flushing of vascular access device: per [facility] Protocol: 0.9% NaCl pre/post medication administration and prn patency; Heparin 100 u/ml, 87m for implanted ports and Heparin 10u/ml, 546mfor all other central venous catheters.   Instructions May follow [facility] Anaphylaxis Protocol for First Dose Administration in the home: 0.9% NaCl at 25-50 ml/hr to maintain IV access for protocol meds. Epinephrine 0.3 ml IV/IM PRN and Benadryl 25-50 IV/IM PRN s/s of anaphylaxis.   Instructions [facility] Infusion Coordinator (RN) to assist per patient IV care needs in the home PRN.      01/14/19 0857           Durable Medical Equipment   (From admission, onward)         Start     Ordered   01/16/19 1315  For home use only DME Hospital bed  Once    Question Answer Comment  Length of Need Lifetime   Head must be elevated greater than: 45 degrees   Bed type Semi-electric   Support Surface: Gel Overlay      01/16/19 1315   01/16/19 1055  For home use only DME Hospital bed  Once    Question Answer Comment  Length of Need Lifetime   Patient has (list medical condition): Diabetes, Adenocarcinoma  left lung stages. Port Catheter, Congestive Heart Failure   The above medical condition requires: Patient requires the ability to reposition frequently   Head must be elevated greater than: 45 degrees   Bed type Semi-electric   Support Surface: Gel Overlay      01/16/19 1058         No Known Allergies Follow-up Information    Greer Pickerel, MD. Call.   Specialty: General Surgery Why: Please call to make an appointment after discharge to discuss possible surgery in the future after the completion of your chemotherapy.  Contact information: [address] [place] [address] [phone]        Candee Furbish, MD Follow up on 01/30/2019.   Specialty: Pulmonary Disease Why: at 11am Contact information: [place] [address] [phone]            The results of significant diagnostics from this hospitalization (including imaging, microbiology, ancillary and laboratory) are listed below for reference.    Significant Diagnostic Studies: Dg Chest 1 View  Result Date: 01/02/2019 CLINICAL DATA:  Post thoracentesis EXAM: CHEST  1 VIEW COMPARISON:  01/01/2019 FINDINGS: Status post right thoracentesis with decreasing right effusion. No pneumothorax. Medial right upper lobe suprahilar mass again noted, unchanged. Right Port-A-Cath is unchanged. Left lung clear. Heart is normal size. IMPRESSION: Decreasing right effusion following thoracentesis. No pneumothorax. Otherwise no change.  Electronically Signed   By: Rolm Baptise M.D.   On: 01/02/2019 12:16   Dg Chest 2 View  Result Date: 01/01/2019 CLINICAL DATA:  Shortness of breath EXAM: CHEST - 2 VIEW COMPARISON:  Twenty days ago FINDINGS: Right perihilar opacity (likely radiation fibrosis by CT) with volume loss and small to moderate pleural effusion. Pleural fluid has increased from prior. The left lung remains clear. Normal heart size. Port on the right with tip at the SVC. IMPRESSION: Small to moderate right pleural effusion with increase from 3 weeks ago. Electronically Signed   By: Monte Fantasia M.D.   On: 01/01/2019 05:29   Ct Chest W Contrast  Result Date: 01/14/2019 CLINICAL DATA:  Shortness of breath. Pleural effusion. History of stage IV lung cancer. EXAM: CT CHEST WITH CONTRAST TECHNIQUE: Multidetector CT imaging of the chest was performed during intravenous contrast administration. CONTRAST:  22m OMNIPAQUE IOHEXOL 300 MG/ML  SOLN COMPARISON:  Plain film of earlier in the day. Chest CT 01/01/2019 reviewed. FINDINGS: Cardiovascular: Right-sided Port-A-Cath terminates at the superior caval/atrial junction. Aortic atherosclerosis. Tortuous thoracic aorta. Mild cardiomegaly, without pericardial effusion. Limited evaluation for pulmonary embolism on this nondedicated study. Mild degradation secondary to patient size and motion. Mediastinum/Nodes: Right paratracheal node measures 1.6 cm on 54/2. On the order of 2.1 cm on the prior exam (when remeasured). No left hilar adenopathy. Limited evaluation for right hilar adenopathy. Necrotic right axillary adenopathy, including at 1.9 x 2.6 cm on 57/2. Compare 2.0 x 3.6 cm on the prior exam. Retrocrural node of 1.5 cm on 132/2 versus 1.3 cm on the prior exam (when remeasured). Lungs/Pleura: Moderate right pleural effusion is similar to 01/01/2019 CT. A small left pleural effusion is new since that exam. Compression of right lower lobe bronchi. Mild subsegmental atelectasis at the left  lung base. Inferior/posterior right upper, right middle, and posterior right lower lobe collapse/consolidation. Upper Abdomen: Normal imaged portions of the liver, spleen, stomach, pancreas, adrenal glands, kidneys. Motion degradation continuing into the upper abdomen. The gallbladder appears heterogeneous, at least partially due to motion. Musculoskeletal: Moderate thoracic  spondylosis. IMPRESSION: 1. Degradation secondary to motion and patient body habitus. 2. Moderate right pleural effusion with multifocal right-sided dependent pulmonary opacities, favored to represent compressive atelectasis. 3. Redemonstration of thoracic adenopathy. Improved in the right axillary and right paratracheal stations, increased in the retrocrural space. 4. Apparent heterogeneity of the gallbladder, likely due to motion degradation. If there are right upper quadrant symptoms., Correlate with ultrasound. 5. Aortic Atherosclerosis (ICD10-I70.0). Electronically Signed   By: Abigail Miyamoto M.D.   On: 01/14/2019 18:48   Ct Angio Chest Pe W And/or Wo Contrast  Result Date: 01/01/2019 CLINICAL DATA:  Shortness of breath. Stage IV lung carcinoma. Abdominal pain with nausea EXAM: CT ANGIOGRAPHY CHEST CT ABDOMEN AND PELVIS WITH CONTRAST TECHNIQUE: Multidetector CT imaging of the chest was performed using the standard protocol during bolus administration of intravenous contrast. Multiplanar CT image reconstructions and MIPs were obtained to evaluate the vascular anatomy. Multidetector CT imaging of the abdomen and pelvis was performed using the standard protocol during bolus administration of intravenous contrast. CONTRAST:  139m OMNIPAQUE IOHEXOL 350 MG/ML SOLN COMPARISON:  CT angiogram chest December 10, 2018; CT abdomen and pelvis November 29, 2018; chest radiograph January 01, 2019 FINDINGS: CTA CHEST FINDINGS Cardiovascular: There is no demonstrable pulmonary embolus. There is no thoracic aortic aneurysm or dissection. The visualized  great vessels appear unremarkable. There is rather minimal pericardial fluid. There is no pericardial thickening. Port-A-Cath tip is in the superior vena cava. Mediastinum/Nodes: There remains extensive axillary adenopathy on the right. The largest individual lymph node in the right axilla measures 3.4 x 2.7 cm. There is adenopathy in the right paratracheal region with the largest lymph node in this region measuring 2.3 x 2.1 cm. There is soft tissue prominence in the hilum which is inseparable from lung collapse. The appearance is similar to recent study. No esophageal lesions are evident. Lungs/Pleura: Sizable pleural effusion on the right remains with consolidation/compressive atelectasis throughout the right middle and lower lobes. There is airspace consolidation in the medial aspect of the right upper lobe, stable. On the left, there are areas of relatively mild lower lobe atelectatic change. No consolidation seen on the left. Musculoskeletal: No blastic or lytic bone lesions are evident. No chest wall lesions appreciable. Review of the MIP images confirms the above findings. CT ABDOMEN and PELVIS FINDINGS Hepatobiliary: No focal liver lesions are appreciable. The gallbladder is mildly contracted with gallbladder wall mildly thickened. Gallstones were better seen on recent CT. The proximal common bile duct measures 12 mm which is dilated. The common bile duct tapers distally without mass or calculus evident by radiography. Pancreas: There is no appreciable pancreatic mass or inflammatory focus. Spleen: No splenic lesions are evident. Adrenals/Urinary Tract: Adrenals bilaterally appear normal. Kidneys bilaterally show no evident mass or hydronephrosis on either side. Urinary bladder is midline with wall thickness within normal limits. Stomach/Bowel: There is no appreciable bowel wall or mesenteric thickening. There is no evident bowel obstruction. No free air or portal venous air. The terminal ileum appears  unremarkable. Vascular/Lymphatic: There is no abdominal aortic aneurysm. There is aortic atherosclerosis. There are mildly prominent lymph nodes in the pelvis, stable. The largest lymph node is in the right internal iliac node chain measuring 2.4 x 1.7 cm. There are multiple retroperitoneal lymph nodes, similar to prior study with the largest of these retroperitoneal lymph nodes measuring 0.9 cm in short axis diameter. No new lymph node prominence evident. Reproductive: Uterus is retroverted. Leiomyomatous changes in the uterus are grossly stable compared to  the previous study. No extrauterine pelvic mass is evident. Other: Appendix not seen. No periappendiceal region inflammation. No abscess or ascites evident in the abdomen or pelvis. Musculoskeletal: No blastic or lytic bone lesions. No intramuscular or abdominal wall lesions. Review of the MIP images confirms the above findings. IMPRESSION: CT angiogram chest: 1. No demonstrable pulmonary embolus. No thoracic aortic aneurysm or dissection. 2. Sizable right pleural effusion with consolidation and compressive atelectasis throughout much of the right lung. Essentially complete collapse of the right middle and lower lobes remains. Consolidation medial aspect of the right upper lobe is present. 3. Right paratracheal and right axillary adenopathy, similar to recent study. 4.  Left lower lobe atelectatic change. CT abdomen and pelvis: 1. Areas of lymph node prominence, primarily in the right internal iliac chain, stable and likely of neoplastic etiology given the known lung carcinoma. No new adenopathy. 2. Gallbladder wall is thickened. Known gallstones are better seen on recent CT. There is mild biliary duct dilatation. Correlation with ultrasound to better assess the gallbladder advised given the degree of wall thickening in this area. 3.  Leiomyomatous uterus. 4. No bowel obstruction. No abscess in the abdomen or pelvis. No periappendiceal region inflammation. 5. No  renal or ureteral calculi. No hydronephrosis. Urinary bladder wall thickness within normal limits. Electronically Signed   By: Lowella Grip III M.D.   On: 01/01/2019 08:12   Nm Hepatobiliary Liver Func  Result Date: 01/01/2019 CLINICAL DATA:  Cholelithiasis, RIGHT upper quadrant pain EXAM: NUCLEAR MEDICINE HEPATOBILIARY IMAGING TECHNIQUE: Sequential images of the abdomen were obtained out to 60 minutes following intravenous administration of radiopharmaceutical. RADIOPHARMACEUTICALS:  5.5 mCi Tc-16m Choletec IV COMPARISON:  None Correlation: Ultrasound abdomen 01/01/2019, CT abdomen and pelvis 01/01/2019 FINDINGS: Prompt tracer extraction from bloodstream indicating normal hepatocellular function. Prompt excretion of tracer into biliary tree. Small bowel visualized at 11 minutes. At 1 hour gallbladder had not visualized. Patient then received 4 mg of morphine sulfate IV and imaging was continued for 30 minutes. Faint visualization of the gallbladder following morphine administration. Gallbladder was contracted on prior CT and ultrasound exams exam with mild wall thickening. Delayed visualization of the gallbladder may be seen in patients with chronic cholecystitis. IMPRESSION: Delayed visualization of the gallbladder, seen only following morphine augmentation, raising question of chronic cholecystitis. Gallbladder was contracted with a thickened wall on prior CT and ultrasound exams. Patent CBD. Electronically Signed   By: MLavonia DanaM.D.   On: 01/01/2019 16:18   [place]hest (pleural Effusion[place]  Result Date: 01/16/2019 CLINICAL DATA:  Opacified right hemithorax. EXAM: CHEST ULTRASOUND COMPARISON:  Radiograph from earlier the same day FINDINGS: Moderate right pleural effusion. After discussion with referring MD, thoracentesis deferred pending scheduled CT chest. IMPRESSION: Right pleural effusion.  Thoracentesis deferred. Electronically Signed   By: DLucrezia EuropeM.D.   On: 01/16/2019 08:30   Ct Abdomen  Pelvis W Contrast  Result Date: 01/04/2019 CLINICAL DATA:  Right-sided abdominal pain for a month, increasing over the last 3 days. History of metastatic adenocarcinoma. EXAM: CT ABDOMEN AND PELVIS WITH CONTRAST TECHNIQUE: Multidetector CT imaging of the abdomen and pelvis was performed using the standard protocol following bolus administration of intravenous contrast. CONTRAST:  1059mOMNIPAQUE IOHEXOL 300 MG/ML  SOLN COMPARISON:  01/01/2019 FINDINGS: Lower chest: Moderate right and small left pleural effusions. There is dependent opacity in the lower lobes consistent with atelectasis. Right pleural effusion is smaller than on the prior exam. Left pleural effusion is new. Hepatobiliary: Normal liver. Gallbladder mildly distended.  Gallbladder appears heterogeneous consistent with multiple stones, which were defined on a prior MRCP from 01/01/2019. No adjacent inflammation. Common bile duct dilated to 11 mm, measuring 1.5 cm on the prior MRCP, demonstrating distal tapering. Pancreas: Unremarkable. No pancreatic ductal dilatation or surrounding inflammatory changes. Spleen: Normal in size without focal abnormality. Adrenals/Urinary Tract: No adrenal masses. Kidneys normal in size orientation and position. No renal masses, stones or hydronephrosis. Normal ureters. Bladder is unremarkable. Stomach/Bowel: Stomach is unremarkable. Small bowel and colon are normal in caliber. No bowel wall thickening. No inflammation. Normal appendix visualized. Vascular/Lymphatic: Mildly enlarged retrocrural lymph node to the right of the aorta just below the aortic hiatus, 12 mm in short axis. This is stable from the prior CT. No other enlarged lymph nodes. Mild aortic atherosclerosis. No aneurysm. Reproductive: Uterus is prominent with evidence of fibroids, largest from the fundus measuring 3.2 cm. This appearance is stable. No adnexal masses. Other: No abdominal wall hernia.  No ascites. Musculoskeletal: No fracture or acute finding.  No osteoblastic or osteolytic lesions. IMPRESSION: 1. No acute findings within the abdomen or pelvis. 2. Moderate right pleural effusion decreased from the prior CT. Small left pleural effusion new from the prior CT. Dependent atelectasis in the lower lobes. 3. Gallstones without evidence of acute cholecystitis. Chronic dilation of common bile duct. Findings stable from the recent prior MRCP. 4. No evidence of bowel inflammation.  Normal appendix visualized. 5. Aortic atherosclerosis. Electronically Signed   By: Lajean Manes M.D.   On: 01/04/2019 19:46   Ct Abdomen Pelvis W Contrast  Result Date: 01/01/2019 CLINICAL DATA:  Shortness of breath. Stage IV lung carcinoma. Abdominal pain with nausea EXAM: CT ANGIOGRAPHY CHEST CT ABDOMEN AND PELVIS WITH CONTRAST TECHNIQUE: Multidetector CT imaging of the chest was performed using the standard protocol during bolus administration of intravenous contrast. Multiplanar CT image reconstructions and MIPs were obtained to evaluate the vascular anatomy. Multidetector CT imaging of the abdomen and pelvis was performed using the standard protocol during bolus administration of intravenous contrast. CONTRAST:  168m OMNIPAQUE IOHEXOL 350 MG/ML SOLN COMPARISON:  CT angiogram chest December 10, 2018; CT abdomen and pelvis November 29, 2018; chest radiograph January 01, 2019 FINDINGS: CTA CHEST FINDINGS Cardiovascular: There is no demonstrable pulmonary embolus. There is no thoracic aortic aneurysm or dissection. The visualized great vessels appear unremarkable. There is rather minimal pericardial fluid. There is no pericardial thickening. Port-A-Cath tip is in the superior vena cava. Mediastinum/Nodes: There remains extensive axillary adenopathy on the right. The largest individual lymph node in the right axilla measures 3.4 x 2.7 cm. There is adenopathy in the right paratracheal region with the largest lymph node in this region measuring 2.3 x 2.1 cm. There is soft tissue  prominence in the hilum which is inseparable from lung collapse. The appearance is similar to recent study. No esophageal lesions are evident. Lungs/Pleura: Sizable pleural effusion on the right remains with consolidation/compressive atelectasis throughout the right middle and lower lobes. There is airspace consolidation in the medial aspect of the right upper lobe, stable. On the left, there are areas of relatively mild lower lobe atelectatic change. No consolidation seen on the left. Musculoskeletal: No blastic or lytic bone lesions are evident. No chest wall lesions appreciable. Review of the MIP images confirms the above findings. CT ABDOMEN and PELVIS FINDINGS Hepatobiliary: No focal liver lesions are appreciable. The gallbladder is mildly contracted with gallbladder wall mildly thickened. Gallstones were better seen on recent CT. The proximal common bile duct measures 12  mm which is dilated. The common bile duct tapers distally without mass or calculus evident by radiography. Pancreas: There is no appreciable pancreatic mass or inflammatory focus. Spleen: No splenic lesions are evident. Adrenals/Urinary Tract: Adrenals bilaterally appear normal. Kidneys bilaterally show no evident mass or hydronephrosis on either side. Urinary bladder is midline with wall thickness within normal limits. Stomach/Bowel: There is no appreciable bowel wall or mesenteric thickening. There is no evident bowel obstruction. No free air or portal venous air. The terminal ileum appears unremarkable. Vascular/Lymphatic: There is no abdominal aortic aneurysm. There is aortic atherosclerosis. There are mildly prominent lymph nodes in the pelvis, stable. The largest lymph node is in the right internal iliac node chain measuring 2.4 x 1.7 cm. There are multiple retroperitoneal lymph nodes, similar to prior study with the largest of these retroperitoneal lymph nodes measuring 0.9 cm in short axis diameter. No new lymph node prominence  evident. Reproductive: Uterus is retroverted. Leiomyomatous changes in the uterus are grossly stable compared to the previous study. No extrauterine pelvic mass is evident. Other: Appendix not seen. No periappendiceal region inflammation. No abscess or ascites evident in the abdomen or pelvis. Musculoskeletal: No blastic or lytic bone lesions. No intramuscular or abdominal wall lesions. Review of the MIP images confirms the above findings. IMPRESSION: CT angiogram chest: 1. No demonstrable pulmonary embolus. No thoracic aortic aneurysm or dissection. 2. Sizable right pleural effusion with consolidation and compressive atelectasis throughout much of the right lung. Essentially complete collapse of the right middle and lower lobes remains. Consolidation medial aspect of the right upper lobe is present. 3. Right paratracheal and right axillary adenopathy, similar to recent study. 4.  Left lower lobe atelectatic change. CT abdomen and pelvis: 1. Areas of lymph node prominence, primarily in the right internal iliac chain, stable and likely of neoplastic etiology given the known lung carcinoma. No new adenopathy. 2. Gallbladder wall is thickened. Known gallstones are better seen on recent CT. There is mild biliary duct dilatation. Correlation with ultrasound to better assess the gallbladder advised given the degree of wall thickening in this area. 3.  Leiomyomatous uterus. 4. No bowel obstruction. No abscess in the abdomen or pelvis. No periappendiceal region inflammation. 5. No renal or ureteral calculi. No hydronephrosis. Urinary bladder wall thickness within normal limits. Electronically Signed   By: Lowella Grip III M.D.   On: 01/01/2019 08:12   US Renal  Result Date: 01/13/2019 CLINICAL DATA:  Hematuria EXAM: RENAL / URINARY TRACT ULTRASOUND COMPLETE COMPARISON:  CT dated 01/04/2019 FINDINGS: Right Kidney: Renal measurements: 11.9 x 4.5 x 6 cm = volume: 170 mL . Echogenicity within normal limits. No mass or  hydronephrosis visualized. Left Kidney: Renal measurements: 11.4 x 6.6 x 6.4 cm = volume: 252 mL. Echogenicity within normal limits. No mass or hydronephrosis visualized. Bladder: Appears normal for degree of bladder distention. IMPRESSION: No acute abnormality detected. No finding to explain the patient's hematuria. Electronically Signed   By: Constance Holster M.D.   On: 01/13/2019 22:52   Mr Abdomen Mrcp Wo Contrast  Result Date: 01/01/2019 CLINICAL DATA:  Abdominal pain for 3 months. Nausea. Constipation. Shortness of breath. Stage IV lung cancer. Gallstones. EXAM: MRI ABDOMEN WITHOUT CONTRAST  (INCLUDING MRCP) TECHNIQUE: Multiplanar multisequence MR imaging of the abdomen was performed. Heavily T2-weighted images of the biliary and pancreatic ducts were obtained, and three-dimensional MRCP images were rendered by post processing. COMPARISON:  01/01/2019 abdominal ultrasound. Abdominopelvic CT of 01/01/2019. FINDINGS: Mild limitations secondary to patient body habitus. Lower  chest: Moderate right pleural effusion.  Normal heart size. Hepatobiliary: Normal noncontrast appearance of the liver. There is borderline to minimal intrahepatic biliary duct dilatation, including on 22/3. The gallbladder is stone filled, with stones up to 1.8 cm. The common duct is dilated, including at maximally 1.5 cm on 83/5. Tapers gradually in the region of the pancreatic head. No obstructive stone or mass identified. Pancreas:  Normal, without mass or ductal dilatation. Spleen:  Normal in size, without focal abnormality. Adrenals/Urinary Tract: Normal adrenal glands. Normal kidneys, without hydronephrosis. Stomach/Bowel:  Normal stomach and abdominal bowel loops. Vascular/Lymphatic:  Normal aortic caliber. Prominent abdominal nodes within the retroperitoneum. None are pathologic by size criteria. Retrocrural adenopathy, including at 1.1 cm on 20/3, suspicious. Other:  No ascites. Musculoskeletal: No acute osseous abnormality.  IMPRESSION: 1. Patient body habitus degradation. 2. Cholelithiasis with common duct dilatation. No cause identified. Most likely within normal variation, given today's normal bilirubin level. 3. Retrocrural adenopathy, suspicious for nodal metastasis in this patient with a history of lung cancer. Electronically Signed   By: Abigail Miyamoto M.D.   On: 01/01/2019 20:41   Mr 3d Recon At Scanner  Result Date: 01/01/2019 CLINICAL DATA:  Abdominal pain for 3 months. Nausea. Constipation. Shortness of breath. Stage IV lung cancer. Gallstones. EXAM: MRI ABDOMEN WITHOUT CONTRAST  (INCLUDING MRCP) TECHNIQUE: Multiplanar multisequence MR imaging of the abdomen was performed. Heavily T2-weighted images of the biliary and pancreatic ducts were obtained, and three-dimensional MRCP images were rendered by post processing. COMPARISON:  01/01/2019 abdominal ultrasound. Abdominopelvic CT of 01/01/2019. FINDINGS: Mild limitations secondary to patient body habitus. Lower chest: Moderate right pleural effusion.  Normal heart size. Hepatobiliary: Normal noncontrast appearance of the liver. There is borderline to minimal intrahepatic biliary duct dilatation, including on 22/3. The gallbladder is stone filled, with stones up to 1.8 cm. The common duct is dilated, including at maximally 1.5 cm on 83/5. Tapers gradually in the region of the pancreatic head. No obstructive stone or mass identified. Pancreas:  Normal, without mass or ductal dilatation. Spleen:  Normal in size, without focal abnormality. Adrenals/Urinary Tract: Normal adrenal glands. Normal kidneys, without hydronephrosis. Stomach/Bowel:  Normal stomach and abdominal bowel loops. Vascular/Lymphatic:  Normal aortic caliber. Prominent abdominal nodes within the retroperitoneum. None are pathologic by size criteria. Retrocrural adenopathy, including at 1.1 cm on 20/3, suspicious. Other:  No ascites. Musculoskeletal: No acute osseous abnormality. IMPRESSION: 1. Patient body  habitus degradation. 2. Cholelithiasis with common duct dilatation. No cause identified. Most likely within normal variation, given today's normal bilirubin level. 3. Retrocrural adenopathy, suspicious for nodal metastasis in this patient with a history of lung cancer. Electronically Signed   By: Abigail Miyamoto M.D.   On: 01/01/2019 20:41   US Abdomen Limited  Result Date: 01/01/2019 CLINICAL DATA:  Abdominal pain EXAM: ULTRASOUND ABDOMEN LIMITED RIGHT UPPER QUADRANT COMPARISON:  CT from the same day. FINDINGS: Gallbladder: There is gallbladder wall thickening. There is no pericholecystic free fluid. The sonographic Percell Miller sign is reported as positive. Gallstones are noted measuring up to approximately 1.1 cm. Evaluation of the gallbladder was limited by patient body habitus. The gallbladder appears somewhat contracted. Common bile duct: Diameter: 1.2 cm Liver: Diffuse increased echogenicity with slightly heterogeneous liver. Appearance typically secondary to fatty infiltration. Fibrosis secondary consideration. No secondary findings of cirrhosis noted. No focal hepatic lesion or intrahepatic biliary duct dilatation. Portal vein is patent on color Doppler imaging with normal direction of blood flow towards the liver. Other: A right-sided pleural effusion is noted.  IMPRESSION: 1. Evaluation limited by patient body habitus and poor sonographic windows. 2. There is cholelithiasis with gallbladder wall thickening and a questionable positive sonographic Murphy sign. Findings are concerning for acute calculus cholecystitis in the appropriate clinical setting. HIDA scan may be useful for confirmation given the gallbladder is not significantly distended. 3. Dilated common bile duct. This raises concern for an obstructing process such as choledocholithiasis. Correlation with laboratory studies is recommended. If there is clinical concern for an obstructing process, follow-up with MRCP/ERCP is recommended. 4. Right-sided  pleural effusion. 5. Probable underlying hepatic steatosis. Electronically Signed   By: Constance Holster M.D.   On: 01/01/2019 09:33   Dg Chest Port 1 View  Result Date: 01/19/2019 CLINICAL DATA:  Pleural effusion EXAM: PORTABLE CHEST 1 VIEW COMPARISON:  Radiograph 01/18/2019 FINDINGS: Tunneled right IJ Port-A-Cath tip terminates in the lower SVC. Right pleural drain remains in place with a persistent moderate right hydropneumothorax. There is persistent right perihilar opacity as well. Left basilar atelectasis. No convincing left effusion on this exam. No acute osseous or soft tissue abnormality. IMPRESSION: 1. Unchanged moderate right hydropneumothorax despite the presence of a right pleural drain. 2. Right perihilar opacity, favored to reflect atelectasis though underlying consolidation is not excluded. 3. Left basilar atelectasis, no convincing left effusion on this exam. Electronically Signed   By: Lovena Le M.D.   On: 01/19/2019 06:54   Dg Chest Port 1 View  Result Date: 01/18/2019 CLINICAL DATA:  Pleural effusion EXAM: PORTABLE CHEST 1 VIEW COMPARISON:  Radiograph 01/17/2019 FINDINGS: Accessed right IJ Port-A-Cath tip terminates in the lower SVC. A chest tube is seen in the right hemithorax. Asymmetric opacification of the right lung with fluid tracking along the minor fissure suggest at least moderate residual effusion. Visceral pleural line is noted towards the right lung apex. Atelectatic changes are present in the included portions of the left lung base with likely trace residual left effusion as well. No acute osseous or soft tissue abnormality. IMPRESSION: Satisfactory positioning of the right IJ Port-A-Cath. Right pleural drain is in place. Suspect at least moderate residual right hydropneumothorax. Left basilar atelectasis and trace left effusion Electronically Signed   By: Lovena Le M.D.   On: 01/18/2019 06:36   Dg Chest Port 1 View  Result Date: 01/17/2019 CLINICAL DATA:   53 year old female with history of chest tube. EXAM: PORTABLE CHEST 1 VIEW COMPARISON:  Chest x-ray 01/16/2019. FINDINGS: Right-sided chest tube in position with tip in the medial aspect of the upper right hemithorax. There continues to be a moderate-sized right-sided pneumothorax. Probable significant volume of right-sided pleural fluid also noted. Widespread airspace consolidation in the right lung, and to a lesser extent in the left base. Small left pleural effusion. No evidence of pulmonary edema. Heart size is normal. Upper mediastinal contours are distorted by patient's rotation to the left. Right internal jugular single-lumen porta cath with tip terminating in the distal superior vena cava. IMPRESSION: 1. Support apparatus, as above. 2. Persistent moderate right hydropneumothorax. 3. Small left pleural effusion. 4. Areas of airspace consolidation in the lungs bilaterally (right greater than left), with worsening aeration compared to the prior study. Electronically Signed   By: Vinnie Langton M.D.   On: 01/17/2019 08:16   Dg Chest Port 1 View  Result Date: 01/16/2019 CLINICAL DATA:  Status post right pleural drainage catheter placement today. EXAM: PORTABLE CHEST 1 VIEW COMPARISON:  Single-view of the chest 01/15/2019. FINDINGS: New pleural drainage catheter is in place and courses along the  right hemidiaphragm and paravertebral space with its tip in the right upper lung zone. Right pleural effusion is decreased. There is a right pneumothorax estimated at 20%. The left lung is expanded and clear. Heart size is normal. Port-A-Cath noted. IMPRESSION: New pleural drainage catheter in place. Right pleural effusion has decreased but there is a right pneumothorax estimated 20%. Critical Value/emergent results were called by telephone at the time of interpretation on 01/16/2019 at 1:12 pm to [facility], RN, who verbally acknowledged these results. Electronically Signed   By: Inge Rise M.D.    On: 01/16/2019 13:12   Dg Chest Port 1 View  Result Date: 01/15/2019 CLINICAL DATA:  Lung carcinoma with shortness of breath EXAM: PORTABLE CHEST 1 VIEW COMPARISON:  January 14, 2019 chest radiograph and chest CT FINDINGS: There is extensive opacification throughout much of the right lung consistent with a combination effusion and patchy consolidation. The left lung is clear. Heart is upper normal in size with pulmonary vascularity normal. No adenopathy is appreciable by radiography. Port-A-Cath tip is in the superior vena cava. No bone lesions. No pneumothorax. IMPRESSION: Combination of effusion and consolidation with opacification of much of the right lung. Left lung essentially clear. Stable cardiac silhouette. No adenopathy appreciable by radiography. Port-A-Cath tip in superior vena cava. No pneumothorax. Electronically Signed   By: Lowella Grip III M.D.   On: 01/15/2019 19:59   Dg Chest Port 1 View  Result Date: 01/14/2019 CLINICAL DATA:  Cough, SOB - past medical history significant for metastatic adenocarcinoma, nonischemic cardiomyopathy, hypertension, hypothyroidism, history of malignant pleural effusion status post thoracentesis x3 EXAM: PORTABLE CHEST 1 VIEW COMPARISON:  Chest radiograph 01/12/2019 FINDINGS: Stable cardiomediastinal contours. There is new complete opacification of the right hemithorax likely secondary to effusion and associated atelectasis. The previously seen spiculated right perihilar mass is poorly visualized due to the opacification of the adjacent lung. The left lung is clear. No pneumothorax. Right chest port remains in place. IMPRESSION: 1. Complete opacification of the right hemithorax likely secondary to reaccumulated effusion and secondary atelectasis of the adjacent lung. 2. The previously seen spiculated right perihilar mass is poorly visualized due to the adjacent lung opacification. These results were called by telephone at the time of interpretation on  01/14/2019 at 2:28 pm to provider [facility] , who verbally acknowledged these results. Electronically Signed   By: Audie Pinto M.D.   On: 01/14/2019 14:28   Dg Chest Port 1 View  Result Date: 01/12/2019 CLINICAL DATA:  Shortness of breath EXAM: PORTABLE CHEST 1 VIEW COMPARISON:  01/10/2019 FINDINGS: No significant change in AP portable examination with a spiculated perihilar mass of the right lung, elevation of the right hemidiaphragm, and small bilateral pleural effusions. No new airspace opacity. Right chest port catheter. Heart is unremarkable. IMPRESSION: No significant change in AP portable examination with a spiculated perihilar mass of the right lung, elevation of the right hemidiaphragm, and small bilateral pleural effusions. No new airspace opacity. Electronically Signed   By: Eddie Candle M.D.   On: 01/12/2019 12:41   Dg Chest Port 1 View  Result Date: 01/10/2019 CLINICAL DATA:  Shortness of breath EXAM: PORTABLE CHEST 1 VIEW COMPARISON:  Chest radiograph dated 01/04/2019 FINDINGS: The heart size and mediastinal contours are within normal limits. Right suprahilar consolidation/atelectasis appears unchanged. A right pleural effusion with associated atelectasis has decreased since 01/04/2019. The left lung is clear. There is no pneumothorax. The visualized skeletal structures are unremarkable. A right internal jugular central venous port catheter  tip overlies the superior vena cava. IMPRESSION: 1. Unchanged right suprahilar consolidation/atelectasis. 2. Right pleural effusion with associated atelectasis has decreased since 01/04/2019. No pneumothorax. Electronically Signed   By: Zerita Boers M.D.   On: 01/10/2019 12:59   Dg Chest Port 1 View  Result Date: 01/04/2019 CLINICAL DATA:  Shortness of breath EXAM: PORTABLE CHEST 1 VIEW COMPARISON:  01/02/2019, 01/01/2019 CT FINDINGS: RIGHT-sided PowerPort tip overlies the superior vena cava. Heart size is partially obscured by opacities at the  lung bases. Increased opacity at the RIGHT lung base and associated bilateral pleural effusions. Persistent RIGHT perihilar adenopathy. IMPRESSION: 1. Increased opacity at the RIGHT lung base and bilateral pleural effusions. 2. Persistent RIGHT perihilar adenopathy. 3. No pneumothorax. Electronically Signed   By: Nolon Nations M.D.   On: 01/04/2019 10:33   US Thoracentesis Asp Pleural Space W/img Guide  Result Date: 01/02/2019 INDICATION: Patient with history of lung cancer with recurrent malignant right pleural effusion; request made for diagnostic and therapeutic right thoracentesis. EXAM: ULTRASOUND GUIDED DIAGNOSTIC AND THERAPEUTIC RIGHT THORACENTESIS MEDICATIONS: None COMPLICATIONS: None immediate. PROCEDURE: An ultrasound guided thoracentesis was thoroughly discussed with the patient and questions answered. The benefits, risks, alternatives and complications were also discussed. The patient understands and wishes to proceed with the procedure. Written consent was obtained. Ultrasound was performed to localize and mark an adequate pocket of fluid in the right chest. The area was then prepped and draped in the normal sterile fashion. 1% Lidocaine was used for local anesthesia. Under ultrasound guidance a 6 Fr Safe-T-Centesis catheter was introduced. Thoracentesis was performed. The catheter was removed and a dressing applied. FINDINGS: A total of approximately 920 cc of yellow fluid was removed. Samples were sent to the laboratory as requested by the clinical team. IMPRESSION: Successful ultrasound guided diagnostic and therapeutic right thoracentesis yielding 920 cc of pleural fluid. Read by: Rowe Robert, PA-C Electronically Signed   By: Jerilynn Mages.  Shick M.D.   On: 01/02/2019 12:02    Microbiology: Recent Results (from the past 240 hour(s))  Culture, Urine     Status: Abnormal   Collection Time: 01/10/19  6:00 PM   Specimen: Urine, Random  Result Value Ref Range Status   Specimen Description   Final      URINE, RANDOM Performed at [facility] [address], [place], [place] [address]    Special Requests   Final    NONE Performed at [facility] [place] [address] [place],  [address]    Culture (A)  Final    80,000 COLONIES/mL ESCHERICHIA COLI Confirmed Extended Spectrum Beta-Lactamase Producer (ESBL).  In bloodstream infections from ESBL organisms, carbapenems are preferred over piperacillin/tazobactam. They are shown to have a lower risk of mortality.    Report Status 01/12/2019 FINAL  Final   Organism ID, Bacteria ESCHERICHIA COLI (A)  Final      Susceptibility   Escherichia coli - MIC*    AMPICILLIN >=32 RESISTANT Resistant     CEFAZOLIN >=64 RESISTANT Resistant     CEFTRIAXONE >=64 RESISTANT Resistant     CIPROFLOXACIN >=4 RESISTANT Resistant     GENTAMICIN <=1 SENSITIVE Sensitive     IMIPENEM <=0.25 SENSITIVE Sensitive     NITROFURANTOIN <=16 SENSITIVE Sensitive     TRIMETH/SULFA >=320 RESISTANT Resistant     AMPICILLIN/SULBACTAM 8 SENSITIVE Sensitive     PIP/TAZO <=4 SENSITIVE Sensitive     Extended ESBL POSITIVE Resistant     * 80,000 COLONIES/mL ESCHERICHIA COLI  Labs: Basic Metabolic Panel: Recent Labs  Lab 01/13/19 1217 01/14/19 0313 01/15/19 0200 01/16/19 0400 01/17/19 0410 01/19/19 1056 01/19/19 1057 01/20/19 0333  NA 139 138 140 141 138 140  --  139  K 3.6 3.7 3.5 3.6 4.1 3.9  --  3.6  CL 104 104 107 108 104 106  --  103  CO2 _0 --  27  GLUCOSE 152* 271* 152* 152* 210* 133*  --  122*  BUN _1 --  6  CREATININE 0.72 0.84 0.73 0.73 0.77 0.67  --  0.59  CALCIUM 7.9* 8.0* 8.2* 8.2* 8.1* 8.5*  --  8.4*  MG 1.7 1.7 1.5* 1.7 1.6*  --  1.4* 1.7  PHOS 6.2* 2.9 2.1* 2.3* 3.6  --   --   --    Liver Function Tests: Recent Labs  Lab 01/14/19 0313 01/15/19 0200 01/16/19 0400 01/17/19 0410 01/19/19 1056  AST 116* 76* 58* 51* 53*  ALT 59* 50* 42 35 35  ALKPHOS 239* 249* 245* 240*  195*  BILITOT 0.4 0.4 0.4 0.3 0.5  PROT 6.4* 6.7 6.5 6.3* 6.7  ALBUMIN 2.3* 2.4* 2.4* 2.3* 2.3*   No results for input(s): LIPASE, AMYLASE in the last 168 hours. No results for input(s): AMMONIA in the last 168 hours. CBC: Recent Labs  Lab 01/14/19 0313 01/15/19 0200 01/16/19 0400 01/17/19 0410 01/19/19 1057  WBC 5.9 5.7 6.0 4.9 4.5  NEUTROABS 4.4 4.1 4.5 3.3 3.1  HGB 9.4* 9.5* 9.8* 9.0* 9.4*  HCT 29.1* 29.3* 30.3* 27.5* 29.4*  MCV 99.0 99.0 100.0 100.4* 100.7*  PLT 20* 78* 91* 93* 131*   Cardiac Enzymes: No results for input(s): CKTOTAL, CKMB, CKMBINDEX, TROPONINI in the last 168 hours. BNP: BNP (last 3 results) Recent Labs    12/10/18 1842  BNP 40.5    ProBNP (last 3 results) No results for input(s): PROBNP in the last 8760 hours.  CBG: Recent Labs  Lab 01/19/19 1637 01/19/19 2054 01/20/19 0056 01/20/19 0425 01/20/19 0742  GLUCAP 144* 143* 159* 115* 110*       Signed:  Alma Friendly, MD [facility] 01/20/2019, 10:56 AM

## 2019-01-20 NOTE — Progress Notes (Signed)
Per MD notes:  Patient will also need a hospital bed, as she does have orthopnea (difficulty breathing when laying flat), due to her recurrent malignant pleural effusion, in addition to CHF.  Requires hospital bed for head elevation

## 2019-01-20 NOTE — Progress Notes (Signed)
Patient discharged with AVS, all belongings and pleurex drainage kits.  Patient left via w/c and personal vehicle.  Virginia Rochester, RN

## 2019-01-20 NOTE — TOC Progression Note (Signed)
Transition of Care [facility]) - Progression Note    Patient Details  Name: Marilyn [name] MRN: [id number] Date of Birth: [birth year]  Transition [facility]) CM/SW Contact  Joaquin Courts, RN Phone Number: 01/20/2019, 3:12 PM  Clinical Narrative:    CM spoke with patient. Patient sctive with [facility] prior to admit, rep notified o impending DC home, agency to resume services for HHPT, OT, RN, and aide. [facility] notified of need for hospital bed and delivery is scheduled between 4-6pm. Patient reports her daughter can transport her home this evening after bed delivery.    Expected Discharge Plan: [place] Barriers to Discharge: No Barriers Identified  Expected Discharge Plan and Services Expected Discharge Plan: [place]   Discharge Planning Services: CM Consult Post Acute Care Choice: Resumption of Svcs/PTA Provider Living arrangements for the past 2 months: Single Family Home Expected Discharge Date: 01/20/19               DME Arranged: Hospital bed DME Agency: [facility] Date DME Agency Contacted: 01/20/19 Time DME Agency Contacted: 1509 Representative spoke with at DME Agency: [place]: Nurse's Aide, RN, OT, PT [place] Agency: [place] ([place]) Date [place]: 01/20/19 Time [place]: 27 Representative spoke with at [place]: [place] ([place]) Interventions    Readmission Risk Interventions Readmission Risk Prevention Plan 01/20/2019  Transportation Screening Complete  Medication Review Press photographer) Complete  PCP or Specialist appointment within 3-5 days of discharge Complete  HRI or [place] Complete  SW Recovery Care/Counseling Consult Not Complete  SW Consult Not Complete Comments no needs identified  Palliative Care Screening Not [place] Not Applicable  Some recent data might be hidden

## 2019-01-20 NOTE — Progress Notes (Signed)
    Home health agencies that serve [id number].        Home Health Agencies Search Results  Results List Table  Home Health Agency Information Quality of Patient Care Rating Patient Survey Summary Rating  [facility] [phone] 4 out of 5 stars 4 out of 5 stars  [facility] [phone] 3 out of 5 stars 4 out of 5 stars  [facility] [phone] 4  out of 5 stars 3 out of 5 stars  [facility] [phone] 4 out of 5 stars 4 out of 5 stars  [facility] [phone] 4 out of 5 stars 4 out of 5 stars  [facility] [phone] 3  out of 5 stars 4 out of 5 stars  [facility] [phone] 3 out of 5 stars 4 out of 5 stars  [facility] [phone] 4 out of 5 stars Not Available12  [facility] [phone] 3  out of 5 stars 3 out of 5 stars  [facility] [phone] 3  out of 5 stars 4 out of 5 stars  [facility] [phone] 3  out of 5 stars 3 out of 5 stars  [facility] [phone] 4  out of 5 stars 3 out of 5 stars   Home Health Footnotes  Footnote number Footnote as displayed on Home Health Compare  1 This agency provides services under a federal waiver program to non-traditional, chronic long term population.  2 This agency provides services to a special needs population.  3 Not Available.  4 The number of patient episodes for this measure is too small to report.  5 This measure currently does not have data or provider has been certified/recertified for less than 6 months.  6 The national average for this measure is not provided because of state-to-state differences in data collection.  7 Medicare is not displaying rates for this measure for any home health agency, because of an issue with the data.  8 There were problems with the data and they are being corrected.  9 Zero, or very few, patients met  the survey's rules for inclusion. The scores shown, if any, reflect a very small number of surveys and may not accurately tell how an agency is doing.  10 Survey results are based on less than 12 months of data.  11 Fewer than 70 patients completed the survey. Use the scores shown, if any, with caution as the number of surveys may be too low to accurately tell how an agency is doing.  12 No survey results are available for this period.  13 Data suppressed by CMS for one or more quarters.    

## 2019-01-22 DIAGNOSIS — C3491 Malignant neoplasm of unspecified part of right bronchus or lung: Secondary | ICD-10-CM | POA: Diagnosis not present

## 2019-01-22 DIAGNOSIS — J9601 Acute respiratory failure with hypoxia: Secondary | ICD-10-CM | POA: Diagnosis not present

## 2019-01-22 DIAGNOSIS — T451X5D Adverse effect of antineoplastic and immunosuppressive drugs, subsequent encounter: Secondary | ICD-10-CM | POA: Diagnosis not present

## 2019-01-22 DIAGNOSIS — C792 Secondary malignant neoplasm of skin: Secondary | ICD-10-CM | POA: Diagnosis not present

## 2019-01-22 DIAGNOSIS — G620 Drug-induced polyneuropathy: Secondary | ICD-10-CM | POA: Diagnosis not present

## 2019-01-22 DIAGNOSIS — J910 Malignant pleural effusion: Secondary | ICD-10-CM | POA: Diagnosis not present

## 2019-01-23 DIAGNOSIS — C3491 Malignant neoplasm of unspecified part of right bronchus or lung: Secondary | ICD-10-CM | POA: Diagnosis not present

## 2019-01-23 DIAGNOSIS — T451X5D Adverse effect of antineoplastic and immunosuppressive drugs, subsequent encounter: Secondary | ICD-10-CM | POA: Diagnosis not present

## 2019-01-23 DIAGNOSIS — J9601 Acute respiratory failure with hypoxia: Secondary | ICD-10-CM | POA: Diagnosis not present

## 2019-01-23 DIAGNOSIS — G620 Drug-induced polyneuropathy: Secondary | ICD-10-CM | POA: Diagnosis not present

## 2019-01-23 DIAGNOSIS — J910 Malignant pleural effusion: Secondary | ICD-10-CM | POA: Diagnosis not present

## 2019-01-23 DIAGNOSIS — C792 Secondary malignant neoplasm of skin: Secondary | ICD-10-CM | POA: Diagnosis not present

## 2019-01-24 ENCOUNTER — Telehealth: Payer: Self-pay | Admitting: *Deleted

## 2019-01-24 NOTE — Telephone Encounter (Signed)
Contacted by Iris Pert, PT with [facility] - requested verbal order from [facility] for Home Health PT: 2 x week/3 weeks. Contacted Kelly. Dr. Irene Limbo gave verbal order for this. [facility] verbalized understanding. Contacted by Levada Dy, RN with [facility] - requested verbal order from Dr. Irene Limbo for [place] Nurse visits: 2 x week/2 weeks then 1 x week/ 1 week. Dr. Irene Limbo gave verbal order for this. Contacted [facility].  Levada Dy verbalized understanding.

## 2019-01-25 DIAGNOSIS — J9601 Acute respiratory failure with hypoxia: Secondary | ICD-10-CM | POA: Diagnosis not present

## 2019-01-25 DIAGNOSIS — T451X5D Adverse effect of antineoplastic and immunosuppressive drugs, subsequent encounter: Secondary | ICD-10-CM | POA: Diagnosis not present

## 2019-01-25 DIAGNOSIS — J910 Malignant pleural effusion: Secondary | ICD-10-CM | POA: Diagnosis not present

## 2019-01-25 DIAGNOSIS — G620 Drug-induced polyneuropathy: Secondary | ICD-10-CM | POA: Diagnosis not present

## 2019-01-25 DIAGNOSIS — C792 Secondary malignant neoplasm of skin: Secondary | ICD-10-CM | POA: Diagnosis not present

## 2019-01-25 DIAGNOSIS — C3491 Malignant neoplasm of unspecified part of right bronchus or lung: Secondary | ICD-10-CM | POA: Diagnosis not present

## 2019-01-27 DIAGNOSIS — C3491 Malignant neoplasm of unspecified part of right bronchus or lung: Secondary | ICD-10-CM | POA: Diagnosis not present

## 2019-01-27 DIAGNOSIS — G620 Drug-induced polyneuropathy: Secondary | ICD-10-CM | POA: Diagnosis not present

## 2019-01-27 DIAGNOSIS — J9601 Acute respiratory failure with hypoxia: Secondary | ICD-10-CM | POA: Diagnosis not present

## 2019-01-27 DIAGNOSIS — T451X5D Adverse effect of antineoplastic and immunosuppressive drugs, subsequent encounter: Secondary | ICD-10-CM | POA: Diagnosis not present

## 2019-01-27 DIAGNOSIS — J910 Malignant pleural effusion: Secondary | ICD-10-CM | POA: Diagnosis not present

## 2019-01-27 DIAGNOSIS — C792 Secondary malignant neoplasm of skin: Secondary | ICD-10-CM | POA: Diagnosis not present

## 2019-01-27 IMAGING — CT CT ABD-PELV W/ CM
2 of 5 series · 13 of 36 positions shown, 16 images · IV contrast (APPLIED)
Comparison: 11/11/2017.

CLINICAL DATA: Metastatic lung cancer.

EXAM:
CT CHEST, ABDOMEN, AND PELVIS WITH CONTRAST
TECHNIQUE: Multidetector CT imaging of the chest, abdomen and pelvis was
performed following the standard protocol during bolus
administration of intravenous contrast.
CONTRAST:  100mL OMNIPAQUE IOHEXOL 300 MG/ML  SOLN

[Series 2: cap with · axial · 0.98mm/px · z∈[-583,-48]mm · 10 of 131 slices shown, 13 images]
[im 12/131  mediastinal]
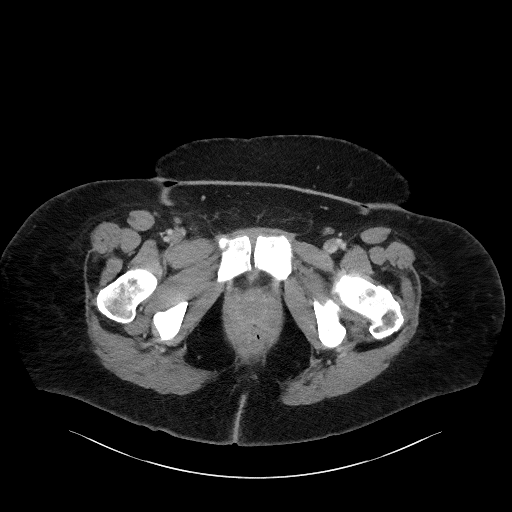
[im 12/131  lung]
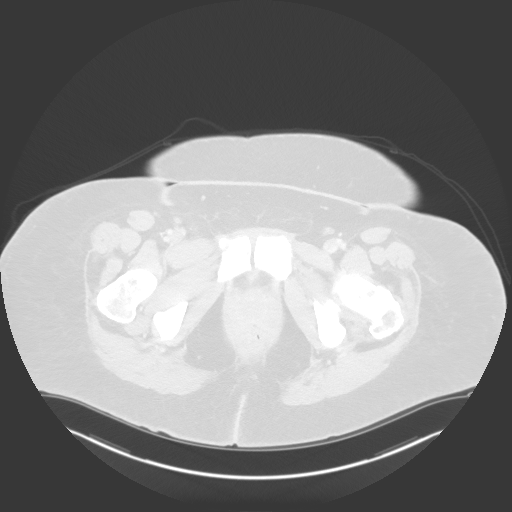
[im 24/131  lung]
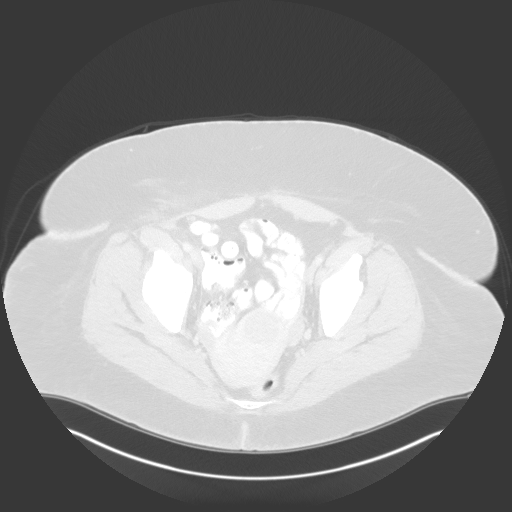
[im 36/131  lung]
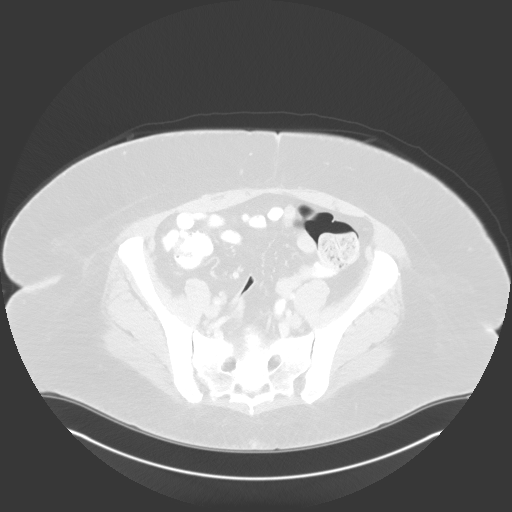
[im 48/131  lung]
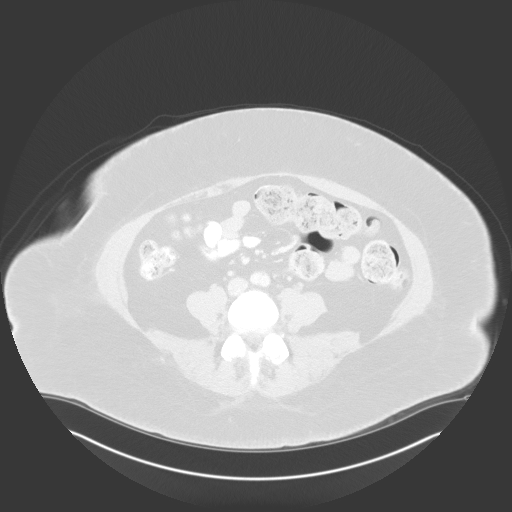
[im 60/131  mediastinal]
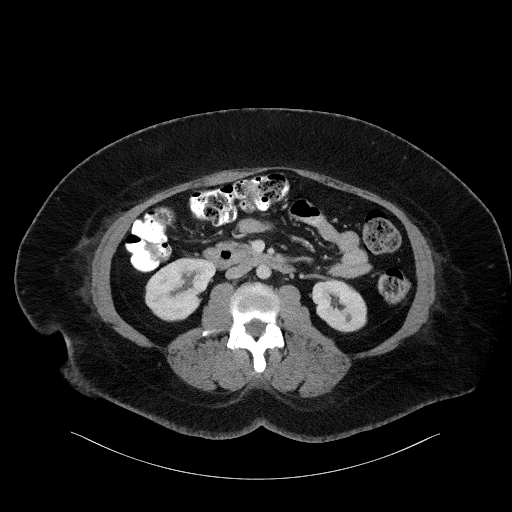
[im 60/131  lung]
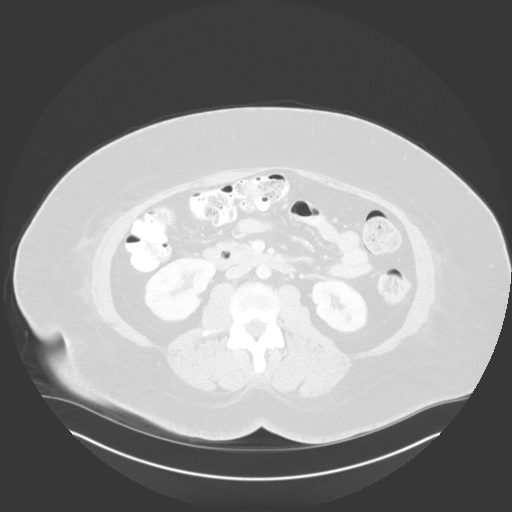
[im 71/131  lung]
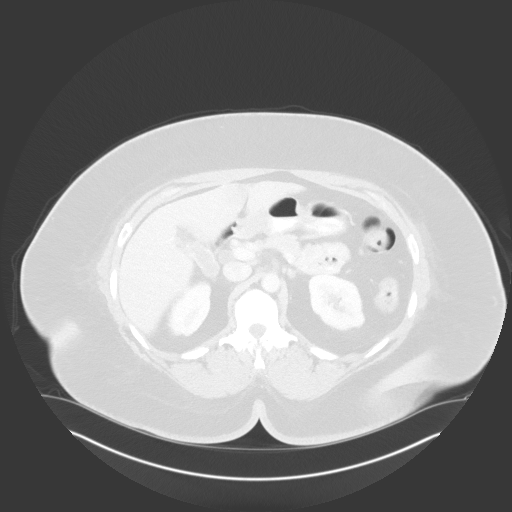
[im 83/131  lung]
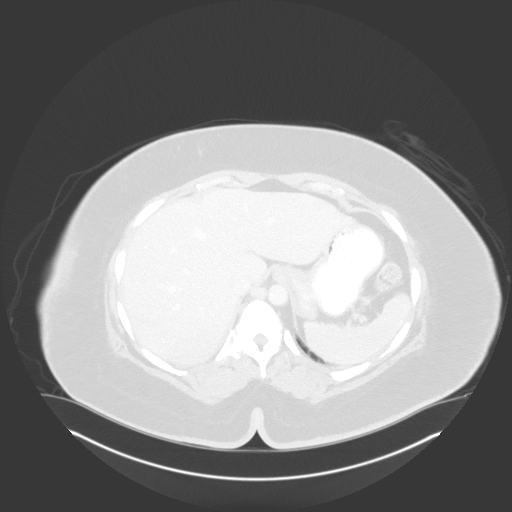
[im 95/131  lung]
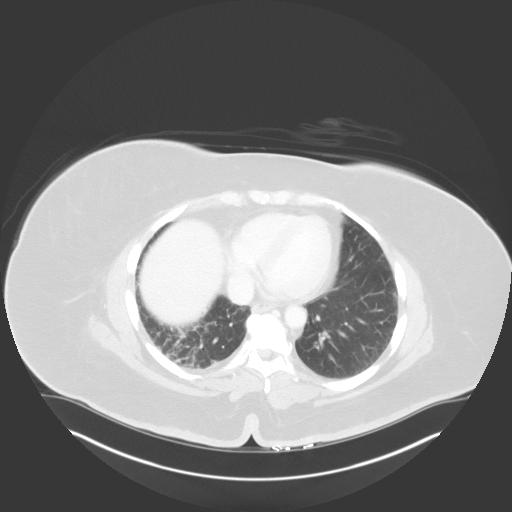
[im 107/131  mediastinal]
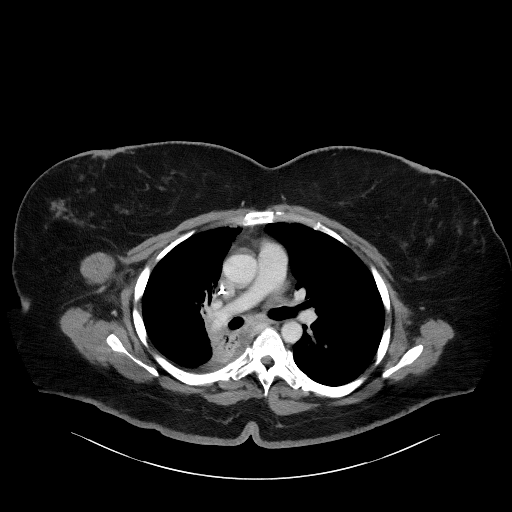
[im 107/131  lung]
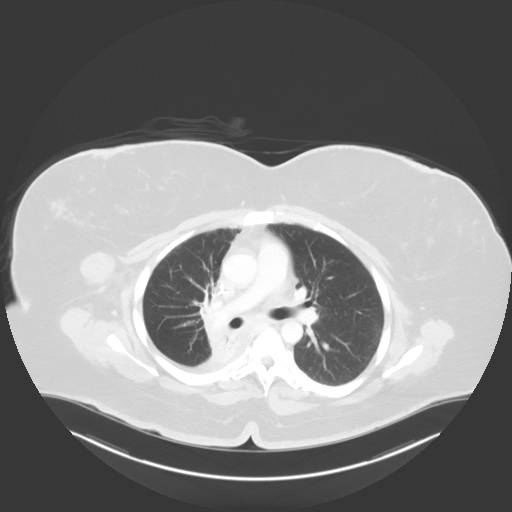
[im 119/131  lung]
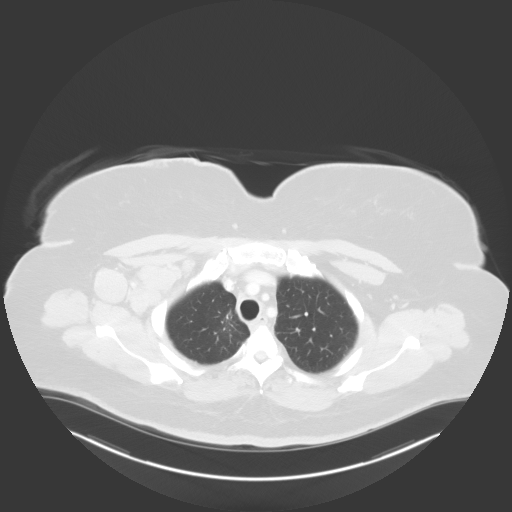

[Series 5: coronals · coronal · 1.00mm/px · 3 of 170 slices shown]
[im 34/170  lung]
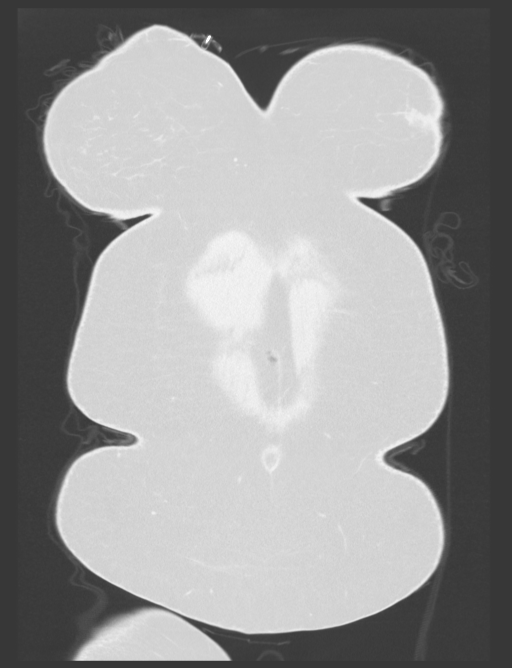
[im 68/170  lung]
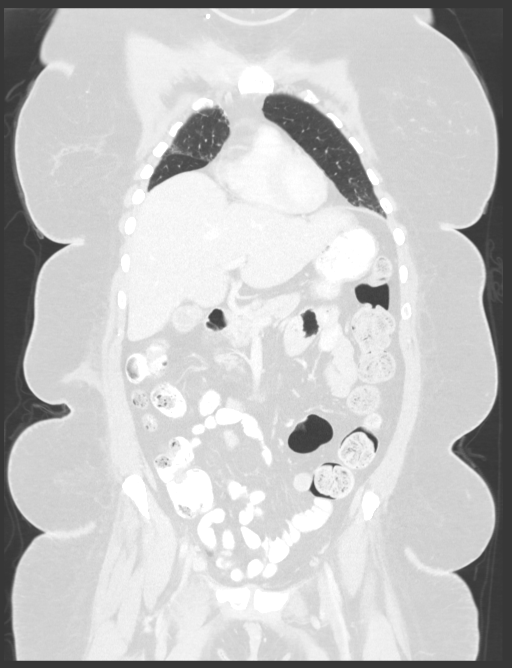
[im 102/170  lung]
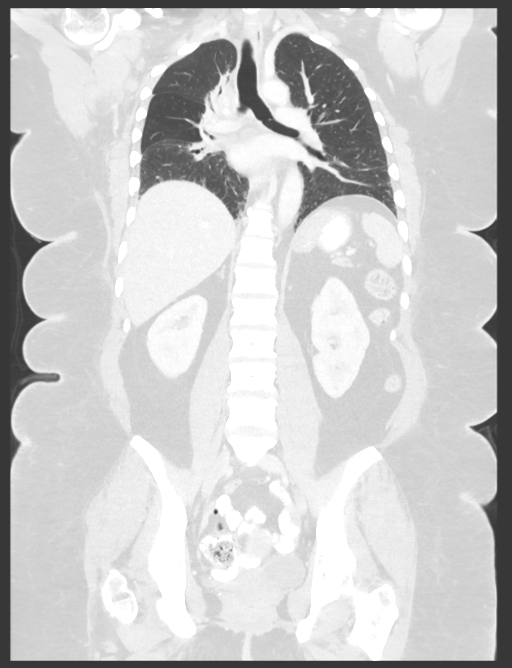

[13 of 36 positions shown; findings below may reference images not displayed]

FINDINGS: CT CHEST FINDINGS

Cardiovascular: Right IJ Port-A-Cath terminates in the right atrium.
Mild atherosclerotic calcification of the aorta. Heart size normal.
No pericardial effusion.

Mediastinum/Nodes: Low right paratracheal adenopathy measures
cm, stable. No hilar adenopathy. Bulky right subpectoral and
axillary adenopathy has markedly progressed in the interval. Largest
lymph node measures 3.2 x 5.5 cm. Previously, the largest lymph node
measured 2.3 x 3.0 cm. No left axillary adenopathy. Esophagus is
grossly unremarkable.

Lungs/Pleura: Parenchymal retraction and bronchiectasis in the
medial and posterior aspects of the right hemithorax, stable and
presumably treatment related. Subpleural ground-glass nodularity in
the left lower lobe is unchanged. No pleural fluid. Airway is
unremarkable.

Musculoskeletal: No worrisome lytic or sclerotic lesions.

CT ABDOMEN PELVIS FINDINGS

Hepatobiliary: Probable focal fat in segment 4. Liver is otherwise
unremarkable. Noncalcified stones in the gallbladder. Bile duct
measures up to 11 mm, as before.

Pancreas: Negative.

Spleen: Negative.

Adrenals/Urinary Tract: Adrenal glands and right kidney are
unremarkable. Subcentimeter low-attenuation lesion in the left
kidney is too small to characterize but statistically, likely a
cyst. Ureters are decompressed. Bladder is low in volume.

Stomach/Bowel: Stomach, small bowel, appendix and colon are
unremarkable.

Vascular/Lymphatic: Minimal atherosclerotic calcification of the
aorta without aneurysm. Retrocrural lymph node measures 13 mm,
previously 3 mm. Right external iliac lymph node measures 1.4 cm,
stable. Interval resection a right inguinal nodal mass.

Reproductive: Mildly hypodense lesions in the uterus measure up to
3.5 cm and are likely fibroids.. No adnexal mass.

Other: No free fluid. Mesenteries and peritoneum are otherwise
unremarkable.

Musculoskeletal: Degenerative changes in the spine. No worrisome
lytic or sclerotic lesions.
IMPRESSION: 1. Bulky right subpectoral/axillary adenopathy, markedly progressive
from 11/11/2017. Enlarging retrocrural lymph node. Low right
paratracheal adenopathy is stable.
2. Interval resection of a right inguinal nodal mass. Stable right
external iliac lymph node.
3. Cholelithiasis.  Stable common bile duct dilatation.
4.  Aortic atherosclerosis ([id number]).

## 2019-01-30 ENCOUNTER — Inpatient Hospital Stay: Payer: Medicare Other | Admitting: Internal Medicine

## 2019-01-30 DIAGNOSIS — J9601 Acute respiratory failure with hypoxia: Secondary | ICD-10-CM | POA: Diagnosis not present

## 2019-01-30 DIAGNOSIS — C792 Secondary malignant neoplasm of skin: Secondary | ICD-10-CM | POA: Diagnosis not present

## 2019-01-30 DIAGNOSIS — G620 Drug-induced polyneuropathy: Secondary | ICD-10-CM | POA: Diagnosis not present

## 2019-01-30 DIAGNOSIS — J910 Malignant pleural effusion: Secondary | ICD-10-CM | POA: Diagnosis not present

## 2019-01-30 DIAGNOSIS — C3491 Malignant neoplasm of unspecified part of right bronchus or lung: Secondary | ICD-10-CM | POA: Diagnosis not present

## 2019-01-30 DIAGNOSIS — T451X5D Adverse effect of antineoplastic and immunosuppressive drugs, subsequent encounter: Secondary | ICD-10-CM | POA: Diagnosis not present

## 2019-01-30 NOTE — Progress Notes (Deleted)
Pulmonary Progress Note  S: Here for f/u of malignant effusion (egfr neg lung adeno) s/p pleurX.  ROS + symptoms in bold Fevers, chills, weight loss Nausea, vomiting, diarrhea Shortness of breath, wheezing, cough Chest pain, palpitations, lower ext edema   O: There were no vitals filed for this visit. There is no height or weight on file to calculate BMI.  GEN: HEENT: CV: PULM: GI: EXT: NEURO: PSYCH: SKIN:  A: # Malignant R pleural effusion s/p pleurX # Lung entrapment on R  P: -

## 2019-01-31 DIAGNOSIS — C3491 Malignant neoplasm of unspecified part of right bronchus or lung: Secondary | ICD-10-CM | POA: Diagnosis not present

## 2019-01-31 DIAGNOSIS — T451X5D Adverse effect of antineoplastic and immunosuppressive drugs, subsequent encounter: Secondary | ICD-10-CM | POA: Diagnosis not present

## 2019-01-31 DIAGNOSIS — C792 Secondary malignant neoplasm of skin: Secondary | ICD-10-CM | POA: Diagnosis not present

## 2019-01-31 DIAGNOSIS — G620 Drug-induced polyneuropathy: Secondary | ICD-10-CM | POA: Diagnosis not present

## 2019-01-31 DIAGNOSIS — J9601 Acute respiratory failure with hypoxia: Secondary | ICD-10-CM | POA: Diagnosis not present

## 2019-01-31 DIAGNOSIS — J910 Malignant pleural effusion: Secondary | ICD-10-CM | POA: Diagnosis not present

## 2019-02-02 ENCOUNTER — Telehealth: Payer: Self-pay | Admitting: *Deleted

## 2019-02-02 DIAGNOSIS — J910 Malignant pleural effusion: Secondary | ICD-10-CM | POA: Diagnosis not present

## 2019-02-02 DIAGNOSIS — C3491 Malignant neoplasm of unspecified part of right bronchus or lung: Secondary | ICD-10-CM | POA: Diagnosis not present

## 2019-02-02 DIAGNOSIS — J9601 Acute respiratory failure with hypoxia: Secondary | ICD-10-CM | POA: Diagnosis not present

## 2019-02-02 DIAGNOSIS — T451X5D Adverse effect of antineoplastic and immunosuppressive drugs, subsequent encounter: Secondary | ICD-10-CM | POA: Diagnosis not present

## 2019-02-02 DIAGNOSIS — C792 Secondary malignant neoplasm of skin: Secondary | ICD-10-CM | POA: Diagnosis not present

## 2019-02-02 DIAGNOSIS — G620 Drug-induced polyneuropathy: Secondary | ICD-10-CM | POA: Diagnosis not present

## 2019-02-02 NOTE — Telephone Encounter (Signed)
Contacted by [place] on Thursday and Joellen Jersey today to notify that patient refused to have pleurx drained. Patient was advised by Joellen Jersey to contact [facility] on call nurse Joellen Jersey) over weekend if she felt like she needed it drained. Katie stated VSS and WNL, lungs clear, no SOB.  Contacted patient to inquire why she was refusing. Patient stated that she can tell when she needs it drained. States she is breathing well, has lost weight and in past, it needed draining every 3-4 weeks, not every week. Advised her to contact [facility] on call nurse if develops any symptoms this weekend - she verbalized understanding.  She asked when she was to see Dr.Kale again as she did not have an appt made on discharge. Advised her that she would be contacted Monday regarding a f/u appt.

## 2019-02-04 DIAGNOSIS — G620 Drug-induced polyneuropathy: Secondary | ICD-10-CM | POA: Diagnosis not present

## 2019-02-04 DIAGNOSIS — J9601 Acute respiratory failure with hypoxia: Secondary | ICD-10-CM | POA: Diagnosis not present

## 2019-02-04 DIAGNOSIS — C3491 Malignant neoplasm of unspecified part of right bronchus or lung: Secondary | ICD-10-CM | POA: Diagnosis not present

## 2019-02-04 DIAGNOSIS — C792 Secondary malignant neoplasm of skin: Secondary | ICD-10-CM | POA: Diagnosis not present

## 2019-02-04 DIAGNOSIS — J910 Malignant pleural effusion: Secondary | ICD-10-CM | POA: Diagnosis not present

## 2019-02-04 DIAGNOSIS — T451X5D Adverse effect of antineoplastic and immunosuppressive drugs, subsequent encounter: Secondary | ICD-10-CM | POA: Diagnosis not present

## 2019-02-05 ENCOUNTER — Telehealth: Payer: Self-pay | Admitting: *Deleted

## 2019-02-05 ENCOUNTER — Other Ambulatory Visit: Payer: Self-pay | Admitting: Hematology

## 2019-02-05 ENCOUNTER — Telehealth: Payer: Self-pay | Admitting: Hematology

## 2019-02-05 MED ORDER — OXYCODONE HCL 5 MG PO TABS: 5.0000 mg | ORAL_TABLET | ORAL | 0 refills | Status: DC | PRN

## 2019-02-05 NOTE — Telephone Encounter (Signed)
Scheduled appt per 1/12 sch message - pt aware of appt date and time   

## 2019-02-05 NOTE — Telephone Encounter (Signed)
Contacted patient after receiving message from scheduler that pt requested call from Dr.Kale's office nurse. Patient states very nauseated, throwing up a lot- states it is her gallbladder cramping and causing her to be nauseated and in pain. She took the pain med she was given on d/c from hospital , but is almost out. Asked if she was taking Zofran prescribed by Dr.Kale for nausea - she states not eating/drinking much. Encouraged her to take twice a day for a few days to see if nausea could be better controlled which will allow her to drink liquids to prevent dehydration.  Patient verbalized understanding and asked for refill of pain med.  Patient stated that home health nurse drained over 600 cc fluid from pluerex yesterday. Encouraged her to allow them to do that on regular basis (had been declining it previously). She said she would consider having it done more often, it just hurts when they drain it. Encouraged to take pain medication prior to procedure. Attempted to contact patient to let her know Dr.Kale sent in refill of pain med. LVVM with this information and encouraged to contact office as needed prior to next appt.

## 2019-02-06 DIAGNOSIS — J910 Malignant pleural effusion: Secondary | ICD-10-CM | POA: Diagnosis not present

## 2019-02-06 DIAGNOSIS — C3491 Malignant neoplasm of unspecified part of right bronchus or lung: Secondary | ICD-10-CM | POA: Diagnosis not present

## 2019-02-06 DIAGNOSIS — T451X5D Adverse effect of antineoplastic and immunosuppressive drugs, subsequent encounter: Secondary | ICD-10-CM | POA: Diagnosis not present

## 2019-02-06 DIAGNOSIS — J9601 Acute respiratory failure with hypoxia: Secondary | ICD-10-CM | POA: Diagnosis not present

## 2019-02-06 DIAGNOSIS — G620 Drug-induced polyneuropathy: Secondary | ICD-10-CM | POA: Diagnosis not present

## 2019-02-06 DIAGNOSIS — C792 Secondary malignant neoplasm of skin: Secondary | ICD-10-CM | POA: Diagnosis not present

## 2019-02-07 ENCOUNTER — Telehealth: Payer: Self-pay | Admitting: *Deleted

## 2019-02-07 DIAGNOSIS — J9601 Acute respiratory failure with hypoxia: Secondary | ICD-10-CM | POA: Diagnosis not present

## 2019-02-07 DIAGNOSIS — J910 Malignant pleural effusion: Secondary | ICD-10-CM | POA: Diagnosis not present

## 2019-02-07 DIAGNOSIS — G620 Drug-induced polyneuropathy: Secondary | ICD-10-CM | POA: Diagnosis not present

## 2019-02-07 DIAGNOSIS — C792 Secondary malignant neoplasm of skin: Secondary | ICD-10-CM | POA: Diagnosis not present

## 2019-02-07 DIAGNOSIS — T451X5D Adverse effect of antineoplastic and immunosuppressive drugs, subsequent encounter: Secondary | ICD-10-CM | POA: Diagnosis not present

## 2019-02-07 DIAGNOSIS — C3491 Malignant neoplasm of unspecified part of right bronchus or lung: Secondary | ICD-10-CM | POA: Diagnosis not present

## 2019-02-07 NOTE — Telephone Encounter (Signed)
Contacted by Sharyn Lull, RN with [facility] - requested verbal orders from [place] for continued skill nurse home visits for patient. 1 x week for 9 weeks to assist w/pleurex. Contacted Michelle and LVMM on secure VM: Verbal order given by Dr.Kale for skill nursing as noted.

## 2019-02-08 ENCOUNTER — Ambulatory Visit (INDEPENDENT_AMBULATORY_CARE_PROVIDER_SITE_OTHER): Payer: Medicare Other

## 2019-02-08 ENCOUNTER — Inpatient Hospital Stay: Payer: Medicare Other | Admitting: Internal Medicine

## 2019-02-08 ENCOUNTER — Encounter: Payer: Self-pay | Admitting: Internal Medicine

## 2019-02-08 ENCOUNTER — Other Ambulatory Visit: Payer: Self-pay

## 2019-02-08 ENCOUNTER — Ambulatory Visit (INDEPENDENT_AMBULATORY_CARE_PROVIDER_SITE_OTHER): Payer: Medicare Other | Admitting: Internal Medicine

## 2019-02-08 ENCOUNTER — Telehealth: Payer: Self-pay | Admitting: Internal Medicine

## 2019-02-08 ENCOUNTER — Telehealth: Payer: Self-pay

## 2019-02-08 VITALS — BP 124/88 | HR 126 | Temp 97.3°F | Ht 69.0 in

## 2019-02-08 DIAGNOSIS — J910 Malignant pleural effusion: Secondary | ICD-10-CM

## 2019-02-08 DIAGNOSIS — J90 Pleural effusion, not elsewhere classified: Secondary | ICD-10-CM | POA: Diagnosis not present

## 2019-02-08 MED ORDER — BACITRACIN 500 UNIT/GM EX OINT: 1.0000 "application " | TOPICAL_OINTMENT | Freq: Two times a day (BID) | CUTANEOUS | 0 refills | Status: AC

## 2019-02-08 NOTE — Progress Notes (Signed)
Pulmonary Office Followup Note  S: Here for f/u of malignant effusion with pleurX (10/13) in place. Metastatic lung adenocarcinoma s/p multiple rounds of chemo Comorbidities include NICCM, HTN, Hypothyroidism, Obesity, IDDM, chronic cholecystitis not operative candidate  Draining twice weekly ~500-600cc each time, mother says she gets breathless near when its time to drain and feels better after  Has pleurisy with draining, known due to entrapment physiology.  Last drained Tuesday.  Some site pain and fluid filled blisters that are resolving.  Unfortunately sutures have not been removed yet.  ROS + symptoms in bold Fevers, chills, weight loss Nausea, vomiting, diarrhea Shortness of breath, wheezing, cough Chest pain, palpitations, lower ext edema  O: Today's Vitals   02/08/19 1014  BP: 124/88  Pulse: (!) 126  Temp: (!) 97.3 F (36.3 C)  TempSrc: Temporal  SpO2: 99%  Height: 5\' 9"  (1.753 m)   Body mass index is 38.97 kg/m.  GEN: chronically ill woman in NAD in wheelchair HEENT: mask in place, trachea midline CV: Tachycardic, ext warm PULM: Diminished on R, clear on L, nonlabored breathing pattern GI: soft, +BS EXT: Trace edema NEURO: Moves all 4 ext to command PSYCH: AOx3, fair insight SKIN: PleurX site CDI, sutures removed, a couple small benign fluid filled blisters toward posterior incision, anterior incision slightly red/irrirated  CXR small loculated R effusion, pleurX in place  A: # Stage IV lung cancer # Malignant effusion s/p pleurX 55/73 # Metabolic syndrome  P - Sutures removed, we only had gauze and tape in office, will reach out to her [facility] agency to redress - Will start dressing with bacitracin to reduce infection risk - Continue biweekly draining - Once <300cc/week draining, can consider removal - Patient has appt with onc next week - f/u in 1 mo  [name] MD

## 2019-02-08 NOTE — Patient Instructions (Signed)
-   We will reach out to your home health company regarding dressing your catheter site - Continue to drain twice weekly - Call if increasing pain, redness, or swelling at catheter site - Your X-ray looks good! - Come back in 1 mo

## 2019-02-08 NOTE — Addendum Note (Signed)
Addended by: Vivia Ewing on: 02/08/2019 12:23 PM   Modules accepted: Orders

## 2019-02-08 NOTE — Telephone Encounter (Signed)
Call made to [place] with [facility] regarding this patient, made aware this patient will need a dressing change, Per Sharyn Lull someone can go by tomorrow to do a dressing change. She also wanted to make [place] aware that this patient is very non-compliant with her dressing changes and pleurx drains. She often refuses. I made here aware I would be sure to let Dr. Tamala Julian know. Made aware we sent in a topical ointment to apply to the dressing site weekly. Voiced understanding. Nothing further needed at this time.

## 2019-02-08 NOTE — Telephone Encounter (Signed)
Call returned to pharmacy, spoke with [facility], she wanted to confirm that the patient is to use her topical ointment twice weekly with dressing changes. Confirmed. Voiced understanding. Nothing further needed at this time.

## 2019-02-11 DIAGNOSIS — B9620 Unspecified Escherichia coli [E. coli] as the cause of diseases classified elsewhere: Secondary | ICD-10-CM | POA: Diagnosis not present

## 2019-02-11 DIAGNOSIS — Z4682 Encounter for fitting and adjustment of non-vascular catheter: Secondary | ICD-10-CM | POA: Diagnosis not present

## 2019-02-11 DIAGNOSIS — E039 Hypothyroidism, unspecified: Secondary | ICD-10-CM | POA: Diagnosis not present

## 2019-02-11 DIAGNOSIS — N390 Urinary tract infection, site not specified: Secondary | ICD-10-CM | POA: Diagnosis not present

## 2019-02-11 DIAGNOSIS — R000 Tachycardia, unspecified: Secondary | ICD-10-CM | POA: Diagnosis not present

## 2019-02-11 DIAGNOSIS — T451X5D Adverse effect of antineoplastic and immunosuppressive drugs, subsequent encounter: Secondary | ICD-10-CM | POA: Diagnosis not present

## 2019-02-11 DIAGNOSIS — E785 Hyperlipidemia, unspecified: Secondary | ICD-10-CM | POA: Diagnosis not present

## 2019-02-11 DIAGNOSIS — Z6837 Body mass index (BMI) 37.0-37.9, adult: Secondary | ICD-10-CM | POA: Diagnosis not present

## 2019-02-11 DIAGNOSIS — C3492 Malignant neoplasm of unspecified part of left bronchus or lung: Secondary | ICD-10-CM | POA: Diagnosis not present

## 2019-02-11 DIAGNOSIS — D696 Thrombocytopenia, unspecified: Secondary | ICD-10-CM | POA: Diagnosis not present

## 2019-02-11 DIAGNOSIS — I5032 Chronic diastolic (congestive) heart failure: Secondary | ICD-10-CM | POA: Diagnosis not present

## 2019-02-11 DIAGNOSIS — K8042 Calculus of bile duct with acute cholecystitis without obstruction: Secondary | ICD-10-CM | POA: Diagnosis not present

## 2019-02-11 DIAGNOSIS — E669 Obesity, unspecified: Secondary | ICD-10-CM | POA: Diagnosis not present

## 2019-02-11 DIAGNOSIS — F329 Major depressive disorder, single episode, unspecified: Secondary | ICD-10-CM | POA: Diagnosis not present

## 2019-02-11 DIAGNOSIS — Z79891 Long term (current) use of opiate analgesic: Secondary | ICD-10-CM | POA: Diagnosis not present

## 2019-02-11 DIAGNOSIS — Z794 Long term (current) use of insulin: Secondary | ICD-10-CM | POA: Diagnosis not present

## 2019-02-11 DIAGNOSIS — J910 Malignant pleural effusion: Secondary | ICD-10-CM | POA: Diagnosis not present

## 2019-02-11 DIAGNOSIS — Z9181 History of falling: Secondary | ICD-10-CM | POA: Diagnosis not present

## 2019-02-11 DIAGNOSIS — C7801 Secondary malignant neoplasm of right lung: Secondary | ICD-10-CM | POA: Diagnosis not present

## 2019-02-11 DIAGNOSIS — I428 Other cardiomyopathies: Secondary | ICD-10-CM | POA: Diagnosis not present

## 2019-02-11 DIAGNOSIS — E119 Type 2 diabetes mellitus without complications: Secondary | ICD-10-CM | POA: Diagnosis not present

## 2019-02-11 DIAGNOSIS — D701 Agranulocytosis secondary to cancer chemotherapy: Secondary | ICD-10-CM | POA: Diagnosis not present

## 2019-02-11 DIAGNOSIS — Z79899 Other long term (current) drug therapy: Secondary | ICD-10-CM | POA: Diagnosis not present

## 2019-02-11 DIAGNOSIS — I110 Hypertensive heart disease with heart failure: Secondary | ICD-10-CM | POA: Diagnosis not present

## 2019-02-13 DIAGNOSIS — D701 Agranulocytosis secondary to cancer chemotherapy: Secondary | ICD-10-CM | POA: Diagnosis not present

## 2019-02-13 DIAGNOSIS — C3492 Malignant neoplasm of unspecified part of left bronchus or lung: Secondary | ICD-10-CM | POA: Diagnosis not present

## 2019-02-13 DIAGNOSIS — C7801 Secondary malignant neoplasm of right lung: Secondary | ICD-10-CM | POA: Diagnosis not present

## 2019-02-13 DIAGNOSIS — T451X5D Adverse effect of antineoplastic and immunosuppressive drugs, subsequent encounter: Secondary | ICD-10-CM | POA: Diagnosis not present

## 2019-02-13 DIAGNOSIS — K8042 Calculus of bile duct with acute cholecystitis without obstruction: Secondary | ICD-10-CM | POA: Diagnosis not present

## 2019-02-13 DIAGNOSIS — J910 Malignant pleural effusion: Secondary | ICD-10-CM | POA: Diagnosis not present

## 2019-02-14 ENCOUNTER — Inpatient Hospital Stay: Payer: Medicare Other | Attending: Hematology | Admitting: Hematology

## 2019-02-14 ENCOUNTER — Telehealth: Payer: Self-pay | Admitting: *Deleted

## 2019-02-14 ENCOUNTER — Other Ambulatory Visit: Payer: Self-pay

## 2019-02-14 ENCOUNTER — Inpatient Hospital Stay: Payer: Medicare Other

## 2019-02-14 VITALS — BP 141/100 | HR 125 | Temp 98.2°F | Resp 18 | Ht 69.0 in

## 2019-02-14 DIAGNOSIS — Z5111 Encounter for antineoplastic chemotherapy: Secondary | ICD-10-CM | POA: Diagnosis present

## 2019-02-14 DIAGNOSIS — E039 Hypothyroidism, unspecified: Secondary | ICD-10-CM | POA: Insufficient documentation

## 2019-02-14 DIAGNOSIS — E1140 Type 2 diabetes mellitus with diabetic neuropathy, unspecified: Secondary | ICD-10-CM | POA: Diagnosis not present

## 2019-02-14 DIAGNOSIS — C792 Secondary malignant neoplasm of skin: Secondary | ICD-10-CM | POA: Diagnosis not present

## 2019-02-14 DIAGNOSIS — K810 Acute cholecystitis: Secondary | ICD-10-CM | POA: Diagnosis not present

## 2019-02-14 DIAGNOSIS — Z7189 Other specified counseling: Secondary | ICD-10-CM

## 2019-02-14 DIAGNOSIS — J45909 Unspecified asthma, uncomplicated: Secondary | ICD-10-CM | POA: Insufficient documentation

## 2019-02-14 DIAGNOSIS — M129 Arthropathy, unspecified: Secondary | ICD-10-CM | POA: Diagnosis not present

## 2019-02-14 DIAGNOSIS — R000 Tachycardia, unspecified: Secondary | ICD-10-CM | POA: Diagnosis not present

## 2019-02-14 DIAGNOSIS — E119 Type 2 diabetes mellitus without complications: Secondary | ICD-10-CM | POA: Diagnosis not present

## 2019-02-14 DIAGNOSIS — Z79899 Other long term (current) drug therapy: Secondary | ICD-10-CM | POA: Insufficient documentation

## 2019-02-14 DIAGNOSIS — G893 Neoplasm related pain (acute) (chronic): Secondary | ICD-10-CM | POA: Diagnosis not present

## 2019-02-14 DIAGNOSIS — G4700 Insomnia, unspecified: Secondary | ICD-10-CM | POA: Insufficient documentation

## 2019-02-14 DIAGNOSIS — G629 Polyneuropathy, unspecified: Secondary | ICD-10-CM | POA: Diagnosis not present

## 2019-02-14 DIAGNOSIS — C3491 Malignant neoplasm of unspecified part of right bronchus or lung: Secondary | ICD-10-CM | POA: Diagnosis not present

## 2019-02-14 DIAGNOSIS — I10 Essential (primary) hypertension: Secondary | ICD-10-CM | POA: Insufficient documentation

## 2019-02-14 DIAGNOSIS — J910 Malignant pleural effusion: Secondary | ICD-10-CM

## 2019-02-14 DIAGNOSIS — C3490 Malignant neoplasm of unspecified part of unspecified bronchus or lung: Secondary | ICD-10-CM | POA: Insufficient documentation

## 2019-02-14 DIAGNOSIS — E7800 Pure hypercholesterolemia, unspecified: Secondary | ICD-10-CM | POA: Insufficient documentation

## 2019-02-14 LAB — CMP (CANCER CENTER ONLY)
ALT: 7 U/L (ref 0–44)
AST: 19 U/L (ref 15–41)
Albumin: 2.3 g/dL — ABNORMAL LOW (ref 3.5–5.0)
Alkaline Phosphatase: 157 U/L — ABNORMAL HIGH (ref 38–126)
Anion gap: 10 (ref 5–15)
BUN: 5 mg/dL — ABNORMAL LOW (ref 6–20)
CO2: 27 mmol/L (ref 22–32)
Calcium: 8.6 mg/dL — ABNORMAL LOW (ref 8.9–10.3)
Chloride: 102 mmol/L (ref 98–111)
Creatinine: 0.78 mg/dL (ref 0.44–1.00)
GFR, Est AFR Am: >60 mL/min (ref >60)
GFR, Estimated: >60 mL/min (ref >60)
Glucose, Bld: 83 mg/dL (ref 70–99)
Potassium: 4.3 mmol/L (ref 3.5–5.1)
Sodium: 139 mmol/L (ref 135–145)
Total Bilirubin: 0.3 mg/dL (ref 0.3–1.2)
Total Protein: 7.8 g/dL (ref 6.5–8.1)

## 2019-02-14 LAB — CBC WITH DIFFERENTIAL/PLATELET
Abs Immature Granulocytes: 0.04 10*3/uL (ref 0.00–0.07)
Basophils Absolute: 0 10*3/uL (ref 0.0–0.1)
Basophils Relative: 0 %
Eosinophils Absolute: 0.2 10*3/uL (ref 0.0–0.5)
Eosinophils Relative: 2 %
HCT: 31.6 % — ABNORMAL LOW (ref 36.0–46.0)
Hemoglobin: 10 g/dL — ABNORMAL LOW (ref 12.0–15.0)
Immature Granulocytes: 0 %
Lymphocytes Relative: 10 %
Lymphs Abs: 0.9 10*3/uL (ref 0.7–4.0)
MCH: 31.7 pg (ref 26.0–34.0)
MCHC: 31.6 g/dL (ref 30.0–36.0)
MCV: 100.3 fL — ABNORMAL HIGH (ref 80.0–100.0)
Monocytes Absolute: 0.9 10*3/uL (ref 0.1–1.0)
Monocytes Relative: 9 %
Neutro Abs: 7.4 10*3/uL (ref 1.7–7.7)
Neutrophils Relative %: 79 %
Platelets: 293 10*3/uL (ref 150–400)
RBC: 3.15 MIL/uL — ABNORMAL LOW (ref 3.87–5.11)
RDW: 17 % — ABNORMAL HIGH (ref 11.5–15.5)
WBC: 9.5 10*3/uL (ref 4.0–10.5)
nRBC: 0 % (ref 0.0–0.2)

## 2019-02-14 LAB — MAGNESIUM: Magnesium: 1.4 mg/dL — CL (ref 1.7–2.4)

## 2019-02-14 MED ORDER — OXYCODONE HCL 5 MG PO TABS: 5.0000 mg | ORAL_TABLET | ORAL | 0 refills | Status: DC | PRN

## 2019-02-14 MED ORDER — LORAZEPAM 0.5 MG PO TABS: 0.5000 mg | ORAL_TABLET | Freq: Four times a day (QID) | ORAL | 0 refills | Status: AC | PRN

## 2019-02-14 NOTE — Progress Notes (Signed)
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 accumulation are identified. Decreased tracer uptake at the thoracic spine and at upper LEFT ribs, nonspecific, can be seen at radiation therapy ports recommend correlation with patient history. RIGHT pleural effusion."  11/29/2018 CT C/A/P w/contrast ([id number]) ([id number]) revealed "1. Persistent low right paratracheal lymphadenopathy, similar to the prior study. This may reflect residual disease. Stable area of presumed postradiation mass-like fibrosis in the perihilar aspect of the right upper lung. 2. Moderate right pleural effusion is similar to the prior examination. 3. Lymphadenopathy also noted in  the right axillary region, right hemipelvis and retroperitoneum, as above, potentially reflective of metastatic disease. 4. Focal low-attenuation region in segment 4B of the liver, similar to prior noncontrast CT examination of the abdomen, likely to represent focal fatty infiltration. 5. Cholelithiasis without evidence of acute cholecystitis  at this time. 6. Aortic atherosclerosis, in addition to 3 vessel coronary artery disease. Please note that although the presence of coronary artery calcium documents the presence of coronary artery disease, the severity of this disease and any potential stenosis cannot be assessed on this non-gated CT examination. Assessment for potential risk factor modification, dietary therapy or pharmacologic therapy may be warranted, if clinically indicated. 7. Additional incidental findings, as above."  #3 h/o radiation pneumonitis - no new symptoms. Does has some cough with exertion but this has improved.  #4 Improving fatigue (grade 1) - improved fatigue with levothyroxine compliance with near normalization of FT4 levels.  - previously given TSH of 12 -- increased Levothyroxine from 119mg to 1562m  ---will likely need larger dose but in the setting of baseline tachycardia we are gradually increasing dose to avoid ppt cardiac arrhythmias.  TSH elevated due to medication non compliance. TSH 6.24, FT4 0.6  #5 normocytic anemia related to chemotherapy -resolving and stable  #6 Left chest wall pain due to costochondritis - mx with pain medications. CTA x 3 neg for PE. No significant pain at this time.  #7 Sinus tachycardia - on BB per cards , no PE on CTA x 3 , -Still having sinus tachycardia . Partly from deconditioning and anemia. Seeing Dr. HiDebara Pickettrom cardiology  -Continues to be on BB  #8 neoplasm related pain is currently controlled without significant medications. Clinically likely has sleep apnea . Would need to be careful with sedative medications . 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 hospice care   -Pt prefers to speak to the surgeon again before deciding  -Will continue to hold Carboplatin -Will continue to hold Alimta + Avastin at this time -Recommended OTC Claritin or Zyrtec -[facility]  -Will see back in 6 weeks with labs   FOLLOW UP: -[facility] -RTC with Dr KaleIrene Limbo6 weeks with labs  The total time spent in the appt was 20 minutes and more than 50% was on counseling and direct patient cares.  All of the patient's questions were answered with apparent satisfaction. The patient knows to call the clinic with any problems, questions or concerns.  GautSullivan LoneMS ABrookdaleIVMS SCH [facility]atology/Oncology Physician [facility]):       [phone] cell):  [phone]):           [phone] JazzYevette Edwards acting as a scribe for Dr. GautSullivan Lone.IMarland Kitchen  have reviewed the above documentation for accuracy and completeness, and I agree with the above. [name] MD

## 2019-02-14 NOTE — Telephone Encounter (Signed)
Per [facility], critical value Magnesium 1.4. Dr. Grier Mitts nurse Carlyon Prows RN, made aware

## 2019-02-15 ENCOUNTER — Telehealth: Payer: Self-pay | Admitting: Hematology

## 2019-02-15 NOTE — Telephone Encounter (Signed)
Scheduled appt per 11/11 los.  Spoke with pt and she is aware of her appt date and time.

## 2019-02-22 ENCOUNTER — Other Ambulatory Visit: Payer: Self-pay | Admitting: Hematology

## 2019-03-01 IMAGING — CT CT ANGIO CHEST
2 of 6 series · 17 of 46 positions shown · IV contrast (APPLIED)
Comparison: Chest CT March 01, 2018; chest radiograph [place]

CLINICAL DATA: Chest pain and shortness of breath. Lung carcinoma
receiving chemotherapy

EXAM:
CT ANGIOGRAPHY CHEST WITH CONTRAST
TECHNIQUE: Multidetector CT imaging of the chest was performed using the
standard protocol during bolus administration of intravenous
contrast. Multiplanar CT image reconstructions and MIPs were
obtained to evaluate the vascular anatomy.
CONTRAST:  100mL II2CAK-AYQ IOPAMIDOL (II2CAK-AYQ) INJECTION 76%

[Series 5: thins · axial · 0.67mm/px · z∈[-298,-54]mm · 15 of 268 slices shown]
[im 12/268  lung]
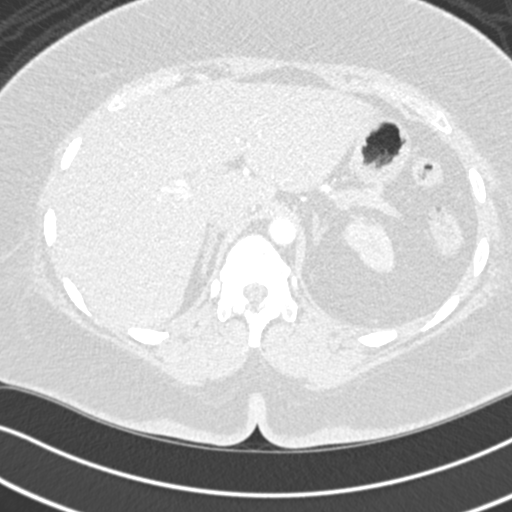
[im 35/268  soft-tissue]
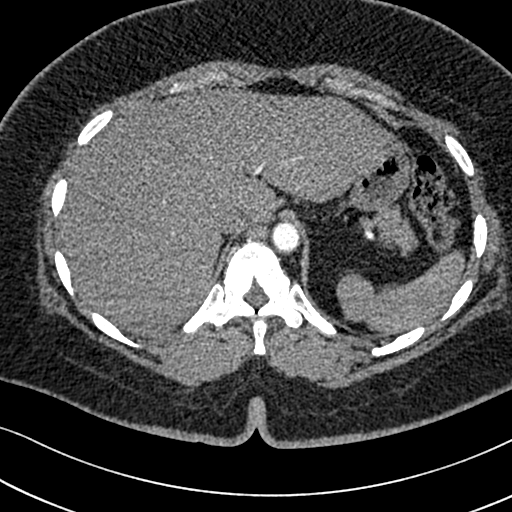
[im 47/268  lung]
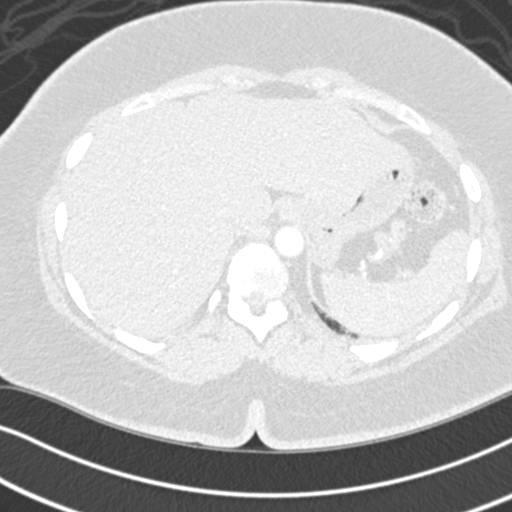
[im 70/268  soft-tissue]
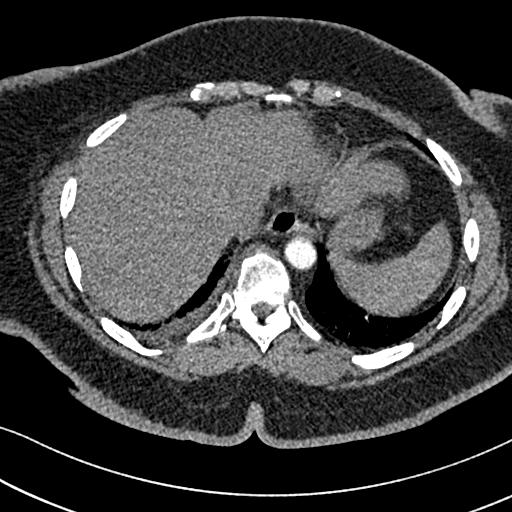
[im 82/268  lung]
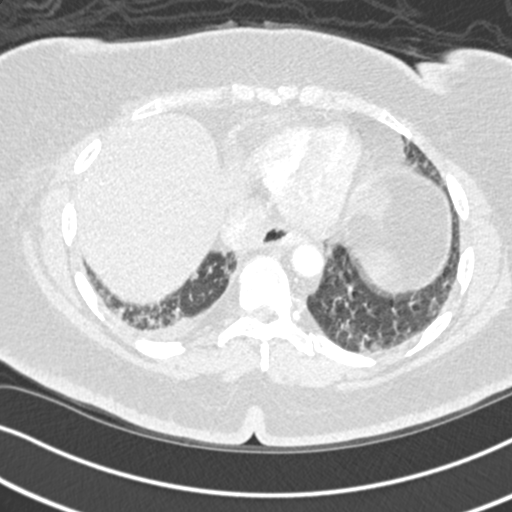
[im 105/268  soft-tissue]
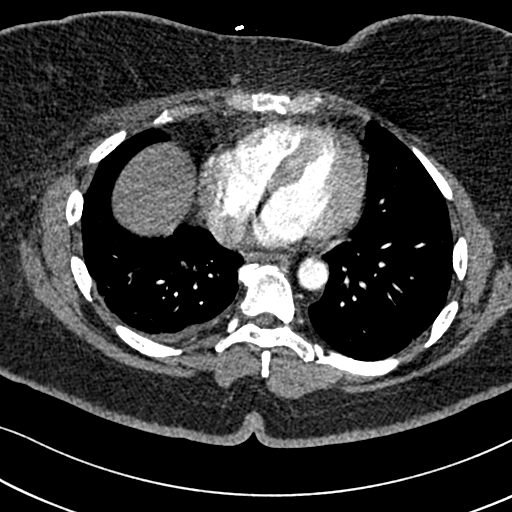
[im 117/268  lung]
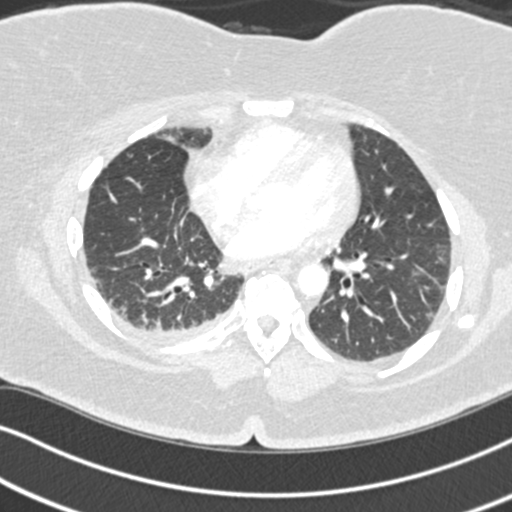
[im 140/268  soft-tissue]
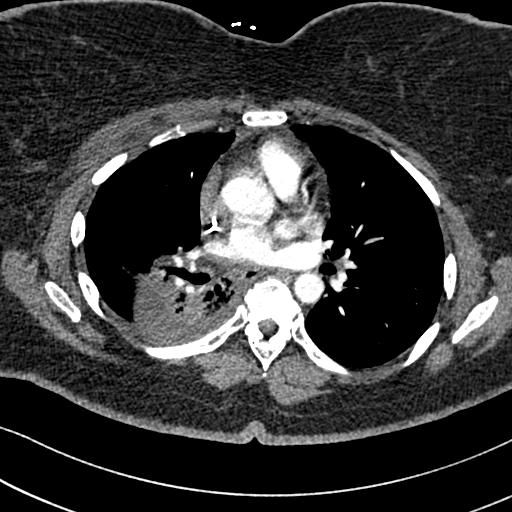
[im 151/268  lung]
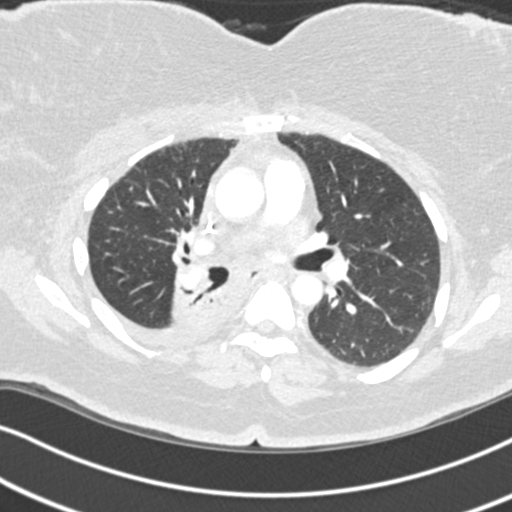
[im 163/268  soft-tissue]
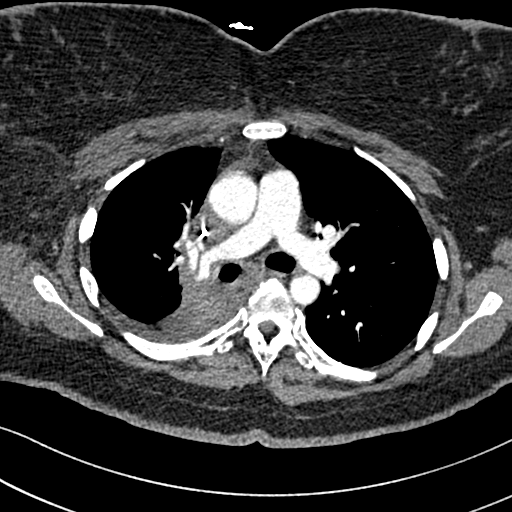
[im 186/268  lung]
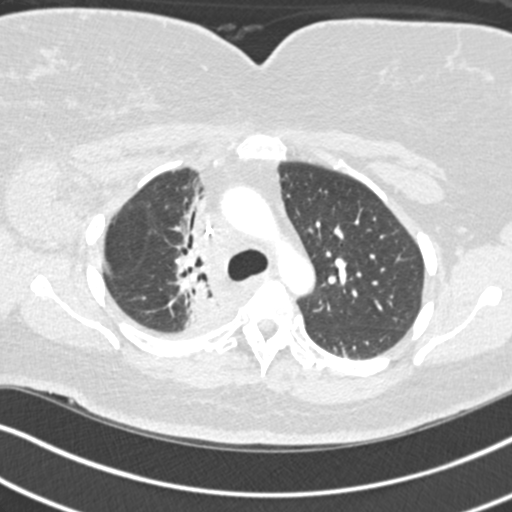
[im 198/268  soft-tissue]
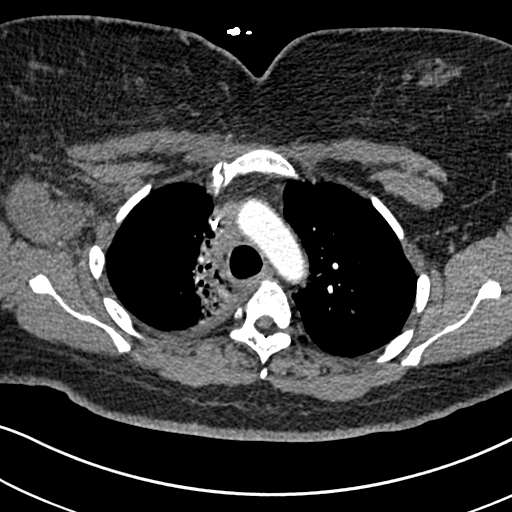
[im 221/268  lung]
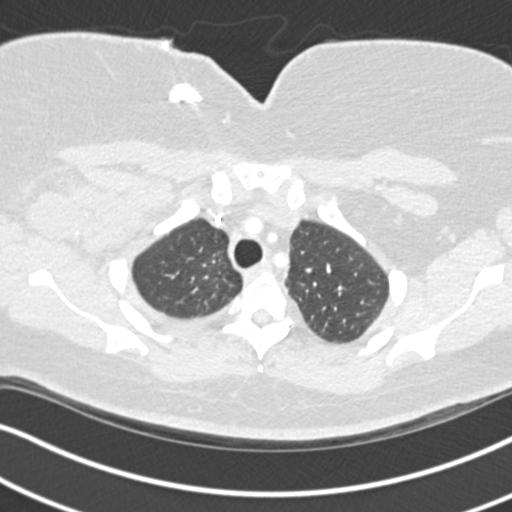
[im 233/268  soft-tissue]
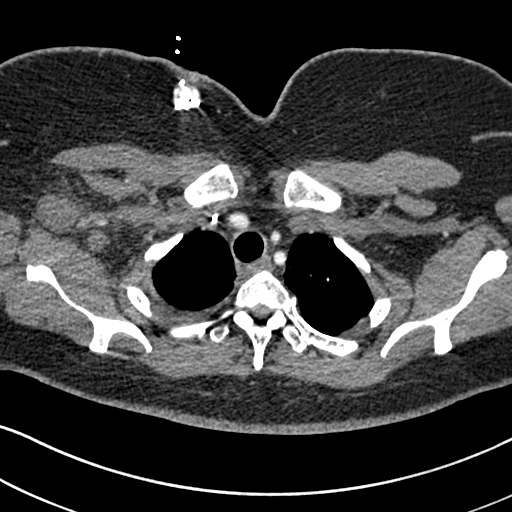
[im 256/268  lung]
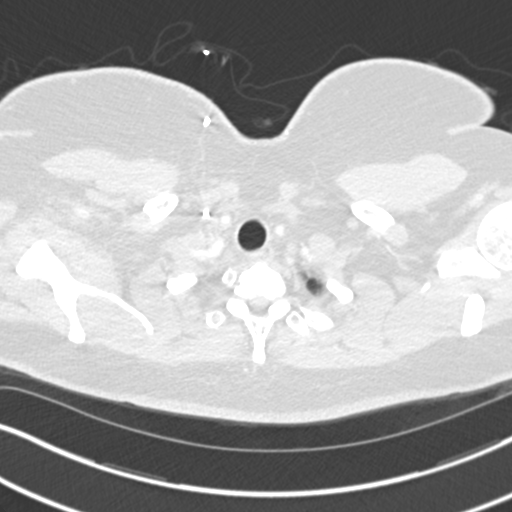

[Series 7: coronal mpr · coronal · 0.55mm/px · 2 of 86 slices shown]
[im 29/86  soft-tissue]
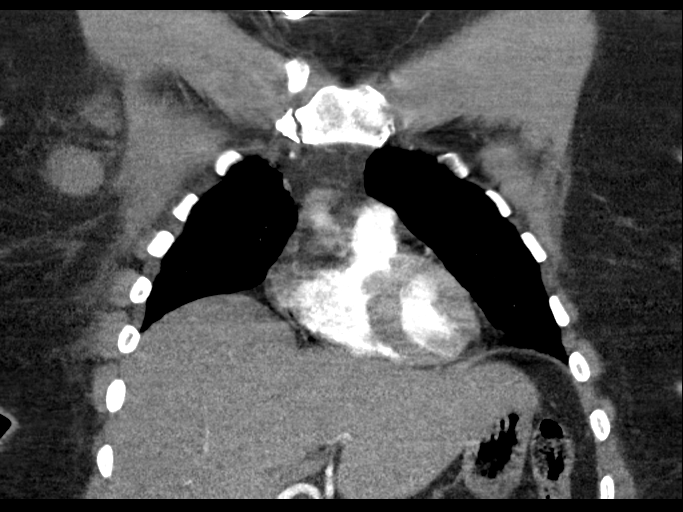
[im 57/86  soft-tissue]
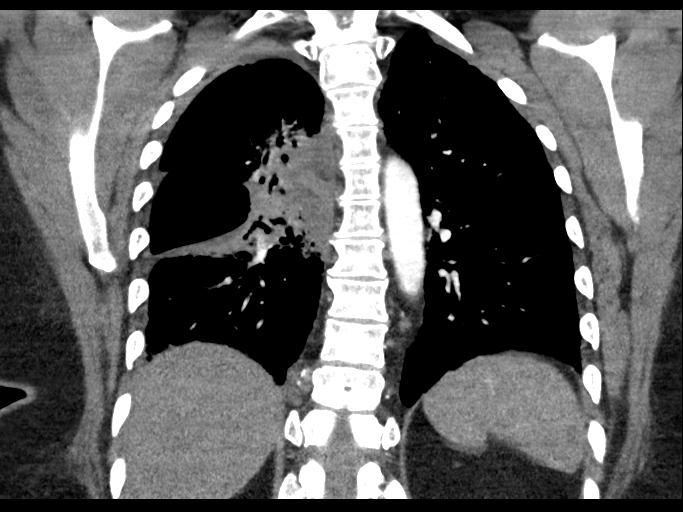

[17 of 46 positions shown; findings below may reference images not displayed]

FINDINGS: Cardiovascular: There is no demonstrable pulmonary embolus. There is
no thoracic aortic aneurysm or dissection. There is no pericardial
effusion or pericardial thickening. Port-A-Cath tip is at the
cavoatrial junction.

Mediastinum/Nodes: Limited visualization of thyroid. There is
extensive right-sided subpectoral and axillary adenopathy, also
present on prior study. The largest lymph node in this area is in
the right axillary region measuring 5.4 x 3.2 cm, unchanged from
most recent study. No progression of adenopathy in this area is
evident. There is adenopathy in the right para carinal region. A
lymph node anterior to the carina on the right measures 2.2 x
cm, stable. There are prominent subcarinal lymph nodes, largest
measuring 1.3 x 1.2 cm. No new adenopathy is evident. No esophageal
lesions are appreciable. There are several conglomerated lymph nodes
in this area vitamin this shunt

Lungs/Pleura: There is a right pleural effusion, larger than on
recent study. There is probable post radiation therapy change in the
right perihilar regions with areas of retraction and localized
bronchiectasis. There is also consolidation and bronchiectatic
change in the right lower lobe, stable. This appearance is similar
to most recent study. No new parenchymal lung opacity evident.

Upper Abdomen: Visualized upper abdominal structures appear
unremarkable.

Musculoskeletal: No blastic or lytic bone lesions are evident. There
are no appreciable chest wall lesions.

Review of the MIP images confirms the above findings.
IMPRESSION: 1. No demonstrable pulmonary embolus. No thoracic aortic aneurysm or
dissection.

2. Stable extensive adenopathy, most marked in the right subpectoral
and axillary regions. No new adenopathy.

3. Consolidation right perihilar and right lower lobe regions.
Probable post radiation therapy changes right perihilar region.
Lower lobe bronchiectatic change. No new parenchymal lung opacity.
There is a right pleural effusion which is slightly larger than 1
month prior.

## 2019-03-01 IMAGING — DX DG CHEST 2V
2 series · 2 of 2 positions shown · non-contrast
Comparison: 07/29/2017 and chest CT dated 03/01/2018.

CLINICAL DATA: Increasing shortness of breath over the past week.
Metastatic lung cancer.

EXAM:
CHEST - 2 VIEW

[chest pa]
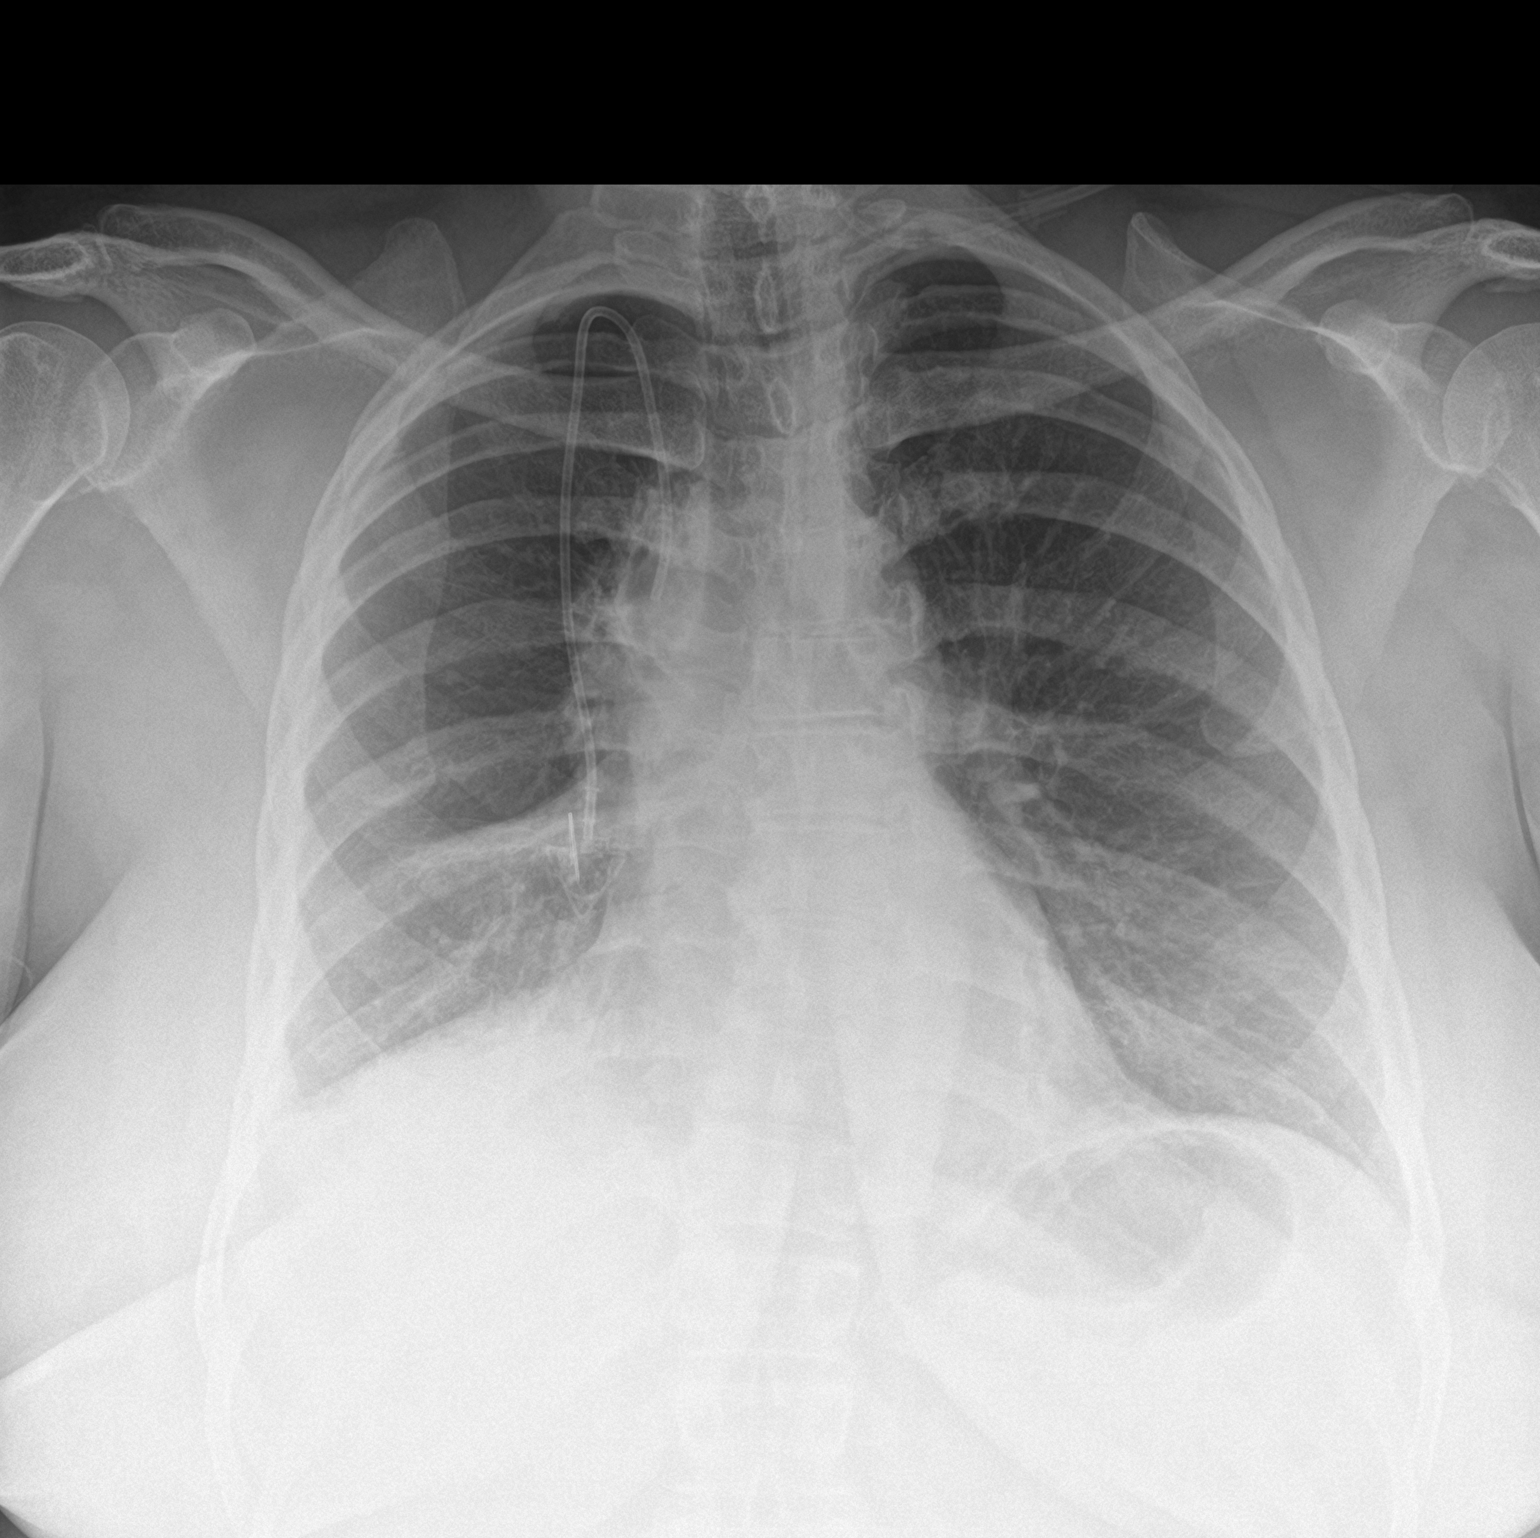

[chest lat]
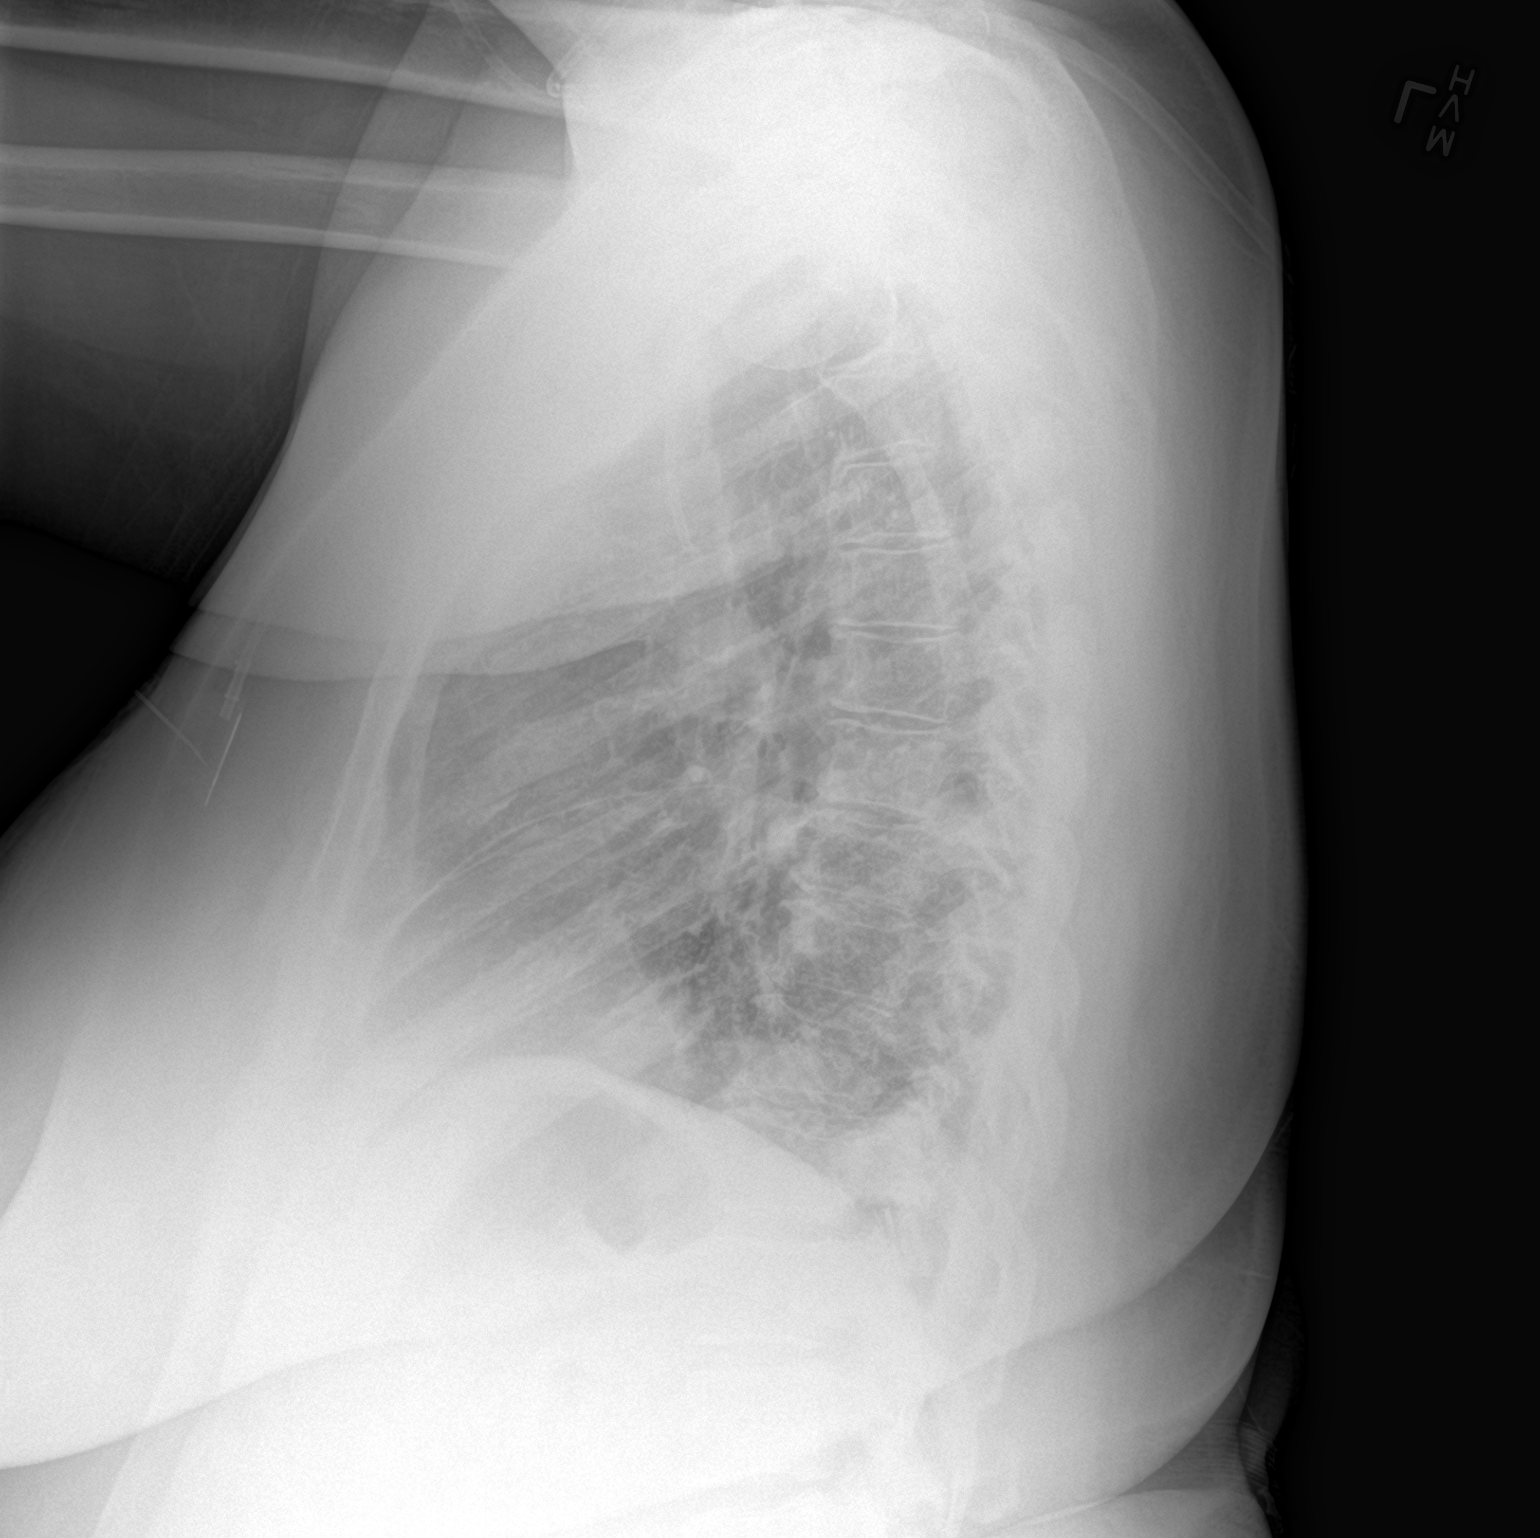

[2 of 2 positions shown; findings below may reference images not displayed]

FINDINGS: Normal sized heart. Stable post radiation changes and scarring on
the right. Interval mild left lower lobe atelectasis. Right jugular
porta catheter tip in the proximal superior vena cava. Mild
scoliosis. Mild thoracic spine degenerative changes.
IMPRESSION: Interval mild left lower lobe atelectasis.

## 2019-03-08 ENCOUNTER — Encounter: Payer: Self-pay | Admitting: Internal Medicine

## 2019-03-08 ENCOUNTER — Other Ambulatory Visit: Payer: Self-pay

## 2019-03-08 ENCOUNTER — Telehealth: Payer: Self-pay | Admitting: *Deleted

## 2019-03-08 ENCOUNTER — Ambulatory Visit (INDEPENDENT_AMBULATORY_CARE_PROVIDER_SITE_OTHER): Payer: Medicare Other | Admitting: Internal Medicine

## 2019-03-08 DIAGNOSIS — R5383 Other fatigue: Secondary | ICD-10-CM | POA: Diagnosis not present

## 2019-03-08 DIAGNOSIS — R0683 Snoring: Secondary | ICD-10-CM

## 2019-03-08 NOTE — Progress Notes (Signed)
Pulmonary Office Followup Note  Due to worsening COVID crisis, this visit was conduced virtually with patient permission.  S:  f/u of R malignant effusion with pleurX (10/13) in place. Metastatic lung adenocarcinoma s/p multiple rounds of chemo Comorbidities include NICCM, HTN, Hypothyroidism, Obesity, IDDM, chronic cholecystitis  Draining once weekly ~500-600cc each time, mother says she gets breathless near when its time to drain and feels better after  Still having chronic cholecystitis symptoms, seen by onc and referred to [facility] to discuss.  She has not heard from that practice yet.  Also recommended for possible sleep study.  + nonrestorative sleep, daytime fatigue.  Never been tested before.  ROS + symptoms in bold Fevers, chills, weight loss Nausea, vomiting, diarrhea Shortness of breath, wheezing, cough Chest pain, palpitations, lower ext edema  O: Speaking in full sentences on phone Good insight  A: # Malignant R effusion s/p pleurX 10/13 # Stage IV lung cancer # Recurrent synmptomatic cholelithiasis vs. cholecystitis # Metabolic syndrome # Question OSA, daytime somnolence, fatigue.  Unsure whether she snores, no prior testing this this.  Does have limited life expectancy but the benefit of PAP for fatigue may be an immediate palliative benefit.  P - Continue weekly drainage, she will let me know if drops < 300cc/week or stops benefits symptomatically then we can consider removal - Home PSG, understands it may be a while before this can be done due to backlog - Resend referral to [place] visit in 1 month   [name] MD

## 2019-03-08 NOTE — Patient Instructions (Signed)
We will check on the referral to [facility]  We will order you a home sleep study  1 month f/u with myself or APP (virtual)

## 2019-03-08 NOTE — Telephone Encounter (Signed)
Late entry for 03/06/2019 - contacted by Rogelia Mire, PT with [facility]. Voice mail reported patient told her on 12/1 that she had a fall  5 days earlier on 11/26. Ms. Tamala Julian states patient denied injury from fall. Ms. Tamala Julian reported VS within normal limits on 12/1 with exception of O2 sats on RA 82%. Dr. Irene Limbo informed.

## 2019-03-12 ENCOUNTER — Telehealth: Payer: Self-pay | Admitting: *Deleted

## 2019-03-12 NOTE — Telephone Encounter (Signed)
Received faxed Telephone Advice fax from [place] : [facility] [EMS] called [phone]) @ 12:19 AM. Patient passed away Jul 12, 2332 per family.  Dr. Irene Limbo informed [phone] AM/03/12/2019. Message sent to [facility] Records.

## 2019-03-26 IMAGING — CT CT HEAD CODE STROKE
3 series · 15 of 47 positions shown, 18 images · non-contrast
Comparison: Prior MRI from 11/19/2016

CLINICAL DATA: Code stroke. Initial evaluation for acute
right-sided numbness and weakness.

EXAM:
CT HEAD WITHOUT CONTRAST
TECHNIQUE: Contiguous axial images were obtained from the base of the skull
through the vertex without intravenous contrast.

[Series 2: head wo · axial · 0.47mm/px · z∈[+1415,+1545]mm · 9 of 32 slices shown, 12 images]
[im 3/32  brain]
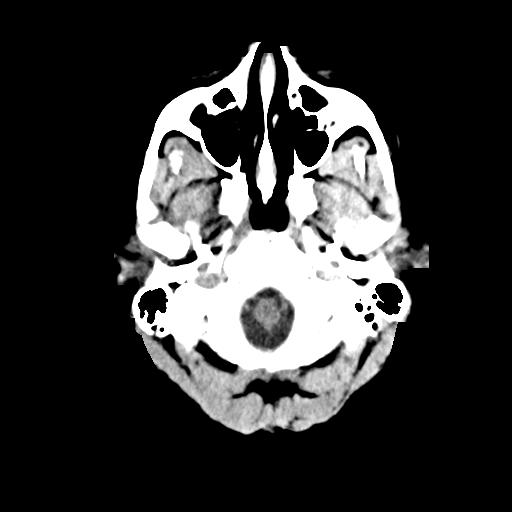
[im 3/32  bone]
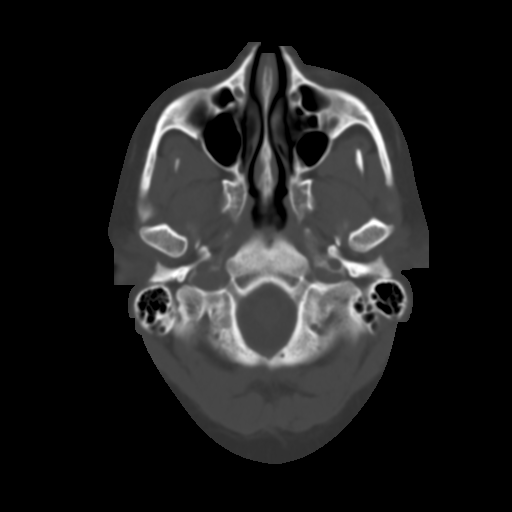
[im 6/32  brain]
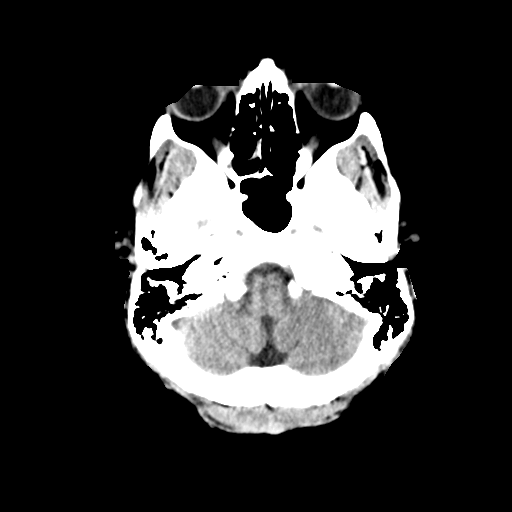
[im 9/32  brain]
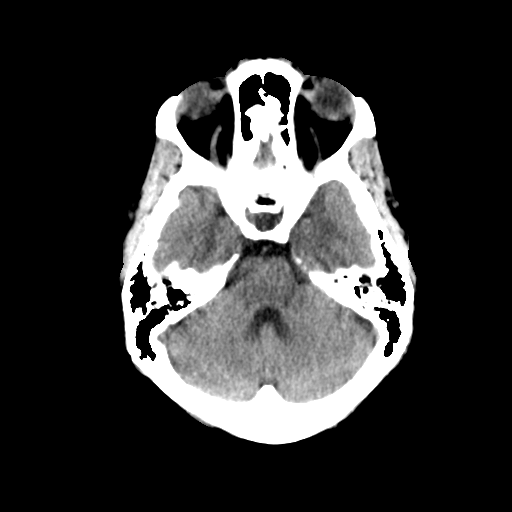
[im 12/32  brain]
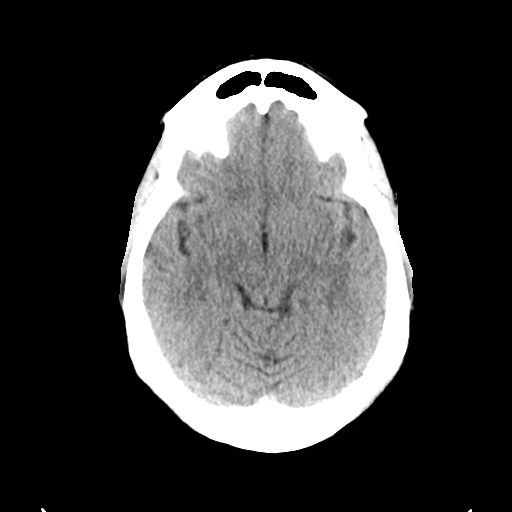
[im 17/32  brain]
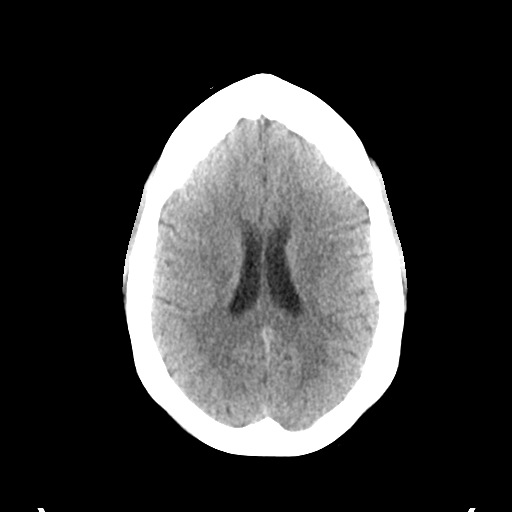
[im 17/32  bone]
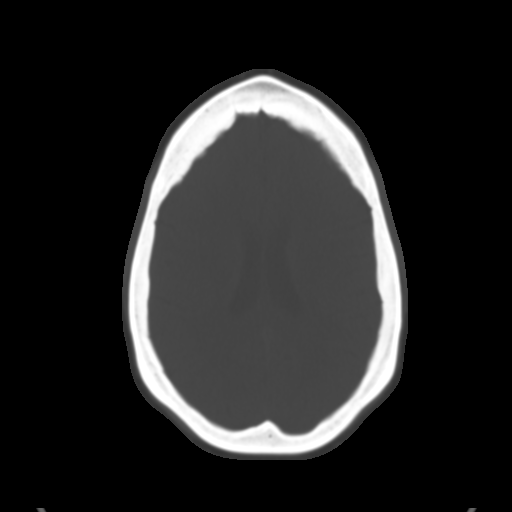
[im 20/32  brain]
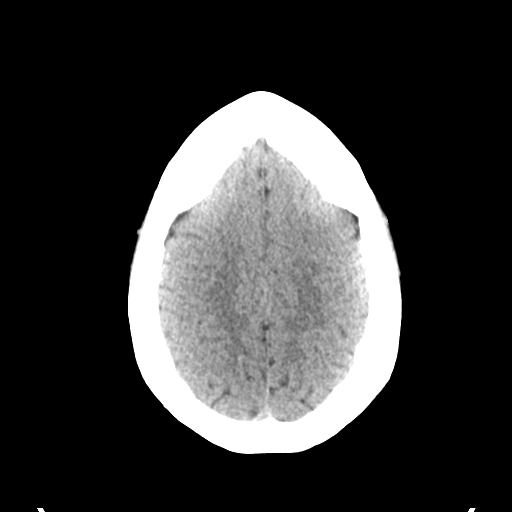
[im 23/32  brain]
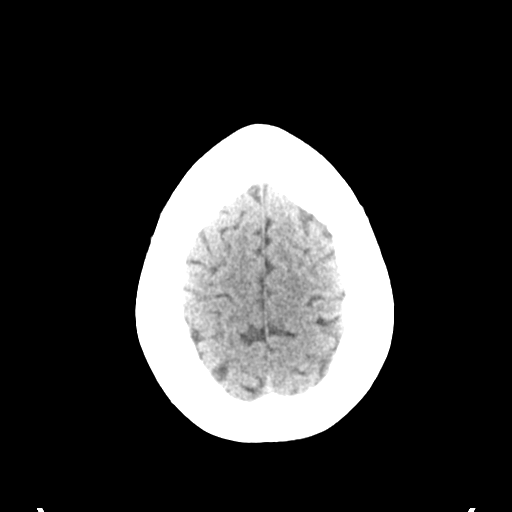
[im 26/32  brain]
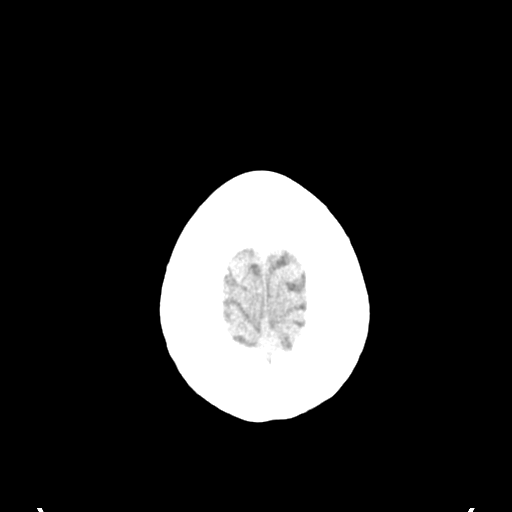
[im 29/32  brain]
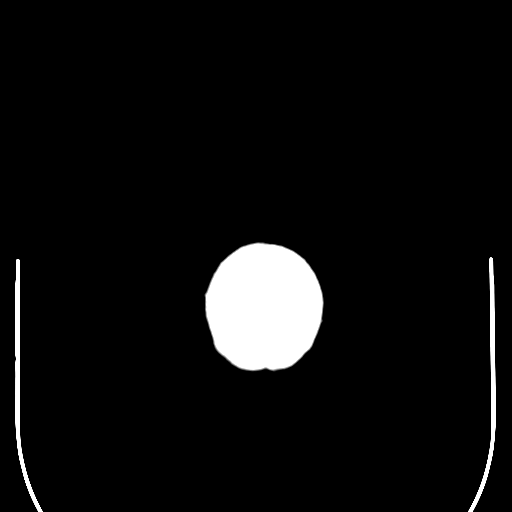
[im 29/32  bone]
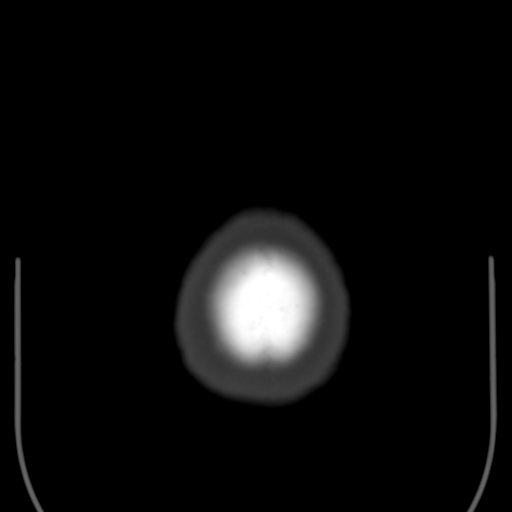

[Series 5: coronal soft tissue · coronal · 0.29mm/px · 3 of 67 slices shown]
[im 23/67  brain]
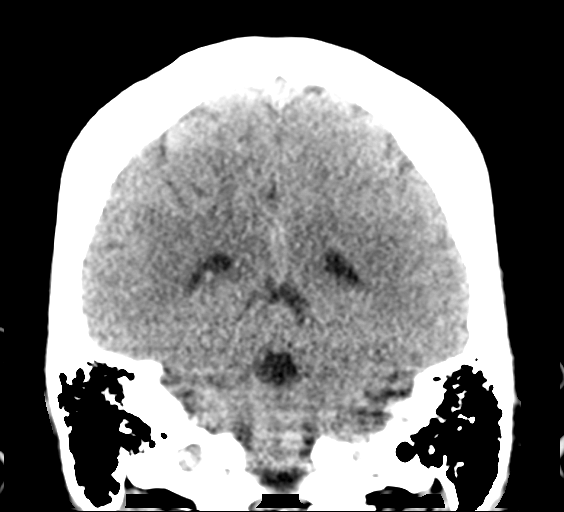
[im 30/67  brain]
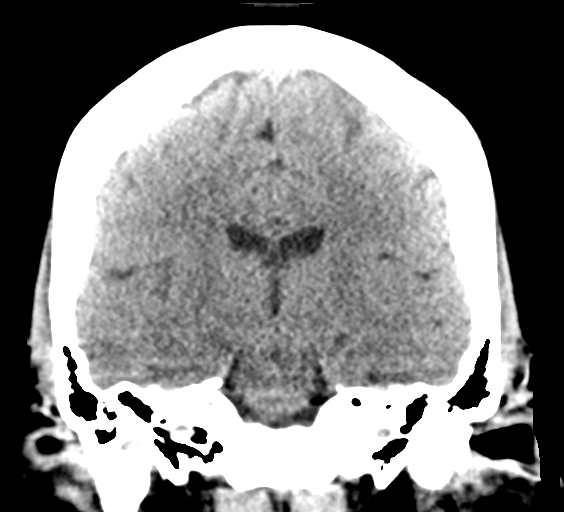
[im 37/67  brain]
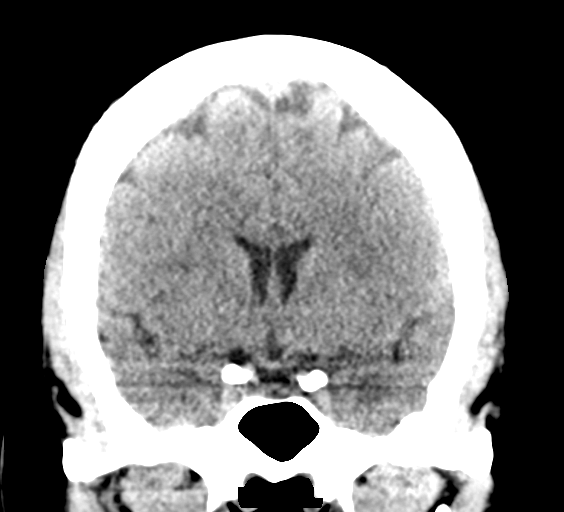

[Series 6: sagittal soft tissue · sagittal · 0.29mm/px · 3 of 55 slices shown]
[im 19/55  brain]
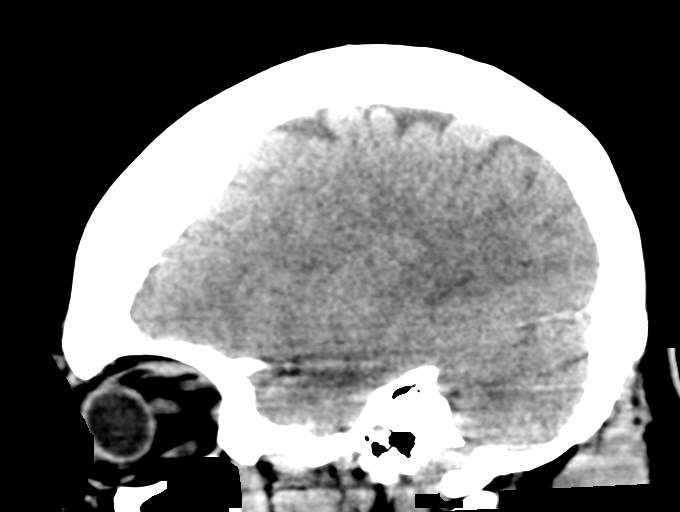
[im 28/55  brain]
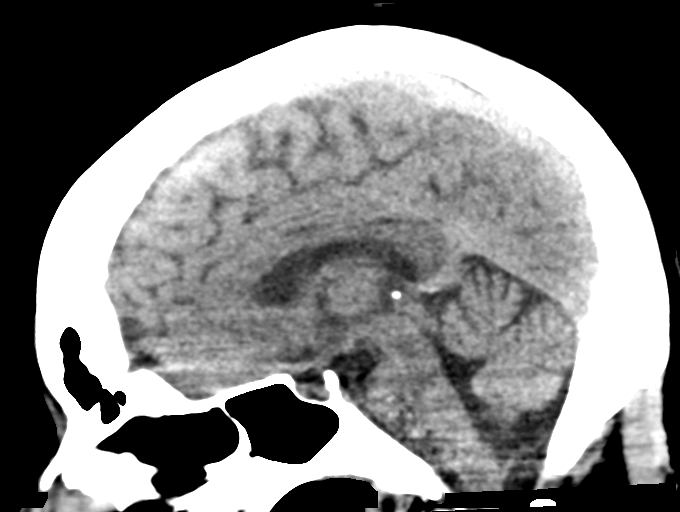
[im 37/55  brain]
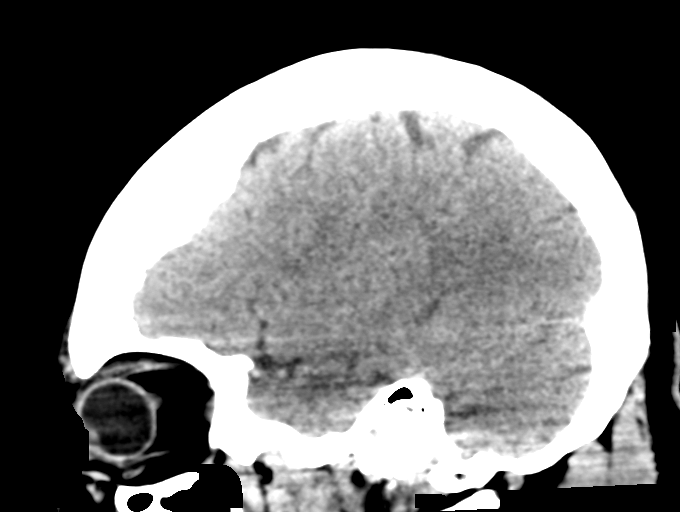

[15 of 47 positions shown; findings below may reference images not displayed]

FINDINGS: Brain: Cerebral volume within normal limits for patient age.

No evidence for acute intracranial hemorrhage. No findings to
suggest acute large vessel territory infarct. No mass lesion,
midline shift, or mass effect. Ventricles are normal in size without
evidence for hydrocephalus. No extra-axial fluid collection
identified.

Vascular: No hyperdense vessel identified.

Skull: Scalp soft tissues demonstrate no acute abnormality.
Calvarium intact.

Sinuses/Orbits: Globes and orbital soft tissues within normal
limits.

Visualized paranasal sinuses are clear. No mastoid effusion.

ASPECTS (Alberta Stroke Program Early CT Score)

- Ganglionic level infarction (caudate, lentiform nuclei, internal
capsule, insula, M1-M3 cortex): 7

- Supraganglionic infarction (M4-M6 cortex): 3

Total score (0-10 with 10 being normal): 10
IMPRESSION: 1. Negative head CT.  No acute intracranial abnormality identified
2. ASPECTS is 10.

Critical Value/emergent results were called by telephone at the time
of interpretation on 04/28/2018 at [DATE] to Dr. TRACI WENDLING ,
who verbally acknowledged these results.

## 2019-03-27 IMAGING — MR MR MRA HEAD W/O CM
13 of 16 series · 32 of 48 positions shown · IV contrast (Gadavist)
Comparison: None.

CLINICAL DATA: Left arm weakness, right facial numbness. Metastatic
lung cancer.

EXAM:
MRI HEAD WITHOUT AND WITH CONTRAST
MRA HEAD WITHOUT CONTRAST
TECHNIQUE: Multiplanar, multiecho pulse sequences of the brain and surrounding
structures were obtained without and with intravenous contrast.
Angiographic images of the head were obtained using MRA technique
without contrast.
CONTRAST:  10 mL Gadavist

[Series 5: DWI · axial · 3.0mm · 0.88mm/px · z∈[-55,+67]mm · 6 of 84 slices shown (1 of 4)]
[im 1/84]
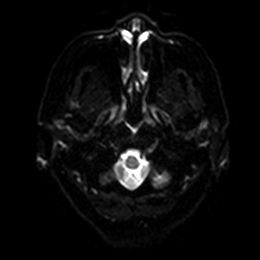
[im 17/84]
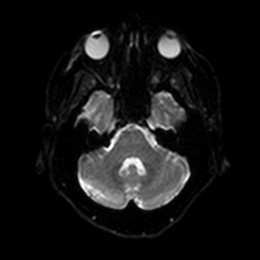
[im 34/84]
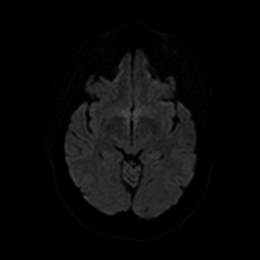
[im 50/84]
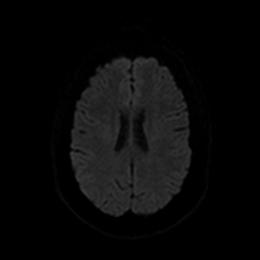
[im 67/84]
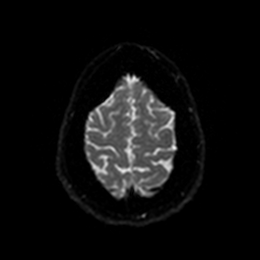
[im 84/84]
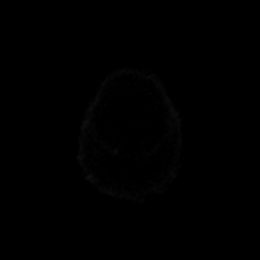

[Series 6: DWI · axial · 3.0mm · 0.88mm/px · z∈[-55,+67]mm · 3 of 42 slices shown (2 of 4)]
[im 1/42]
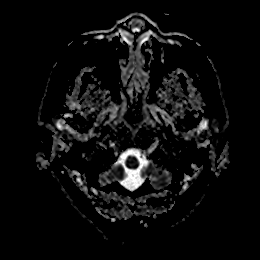
[im 21/42]
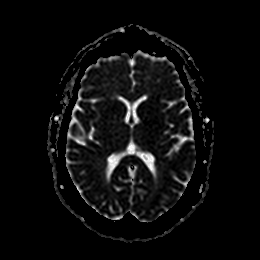
[im 42/42]
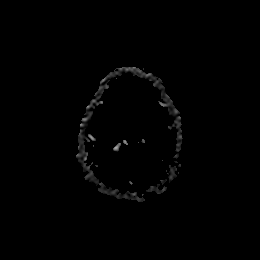

[Series 7: DWI · coronal · 4.0mm · 0.88mm/px · 4 of 64 slices shown (3 of 4)]
[im 1/64]
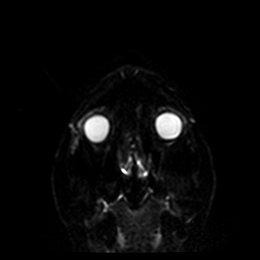
[im 22/64]
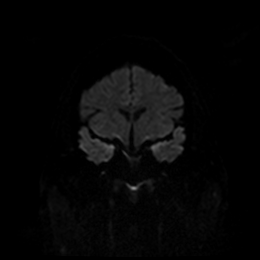
[im 43/64]
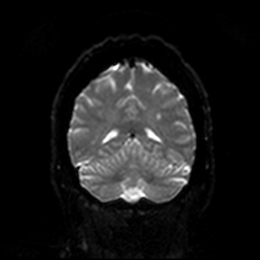
[im 64/64]
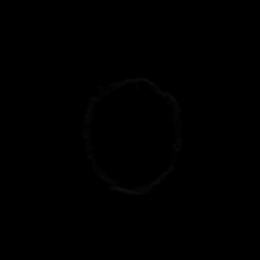

[Series 8: DWI · coronal · 4.0mm · 0.88mm/px · 2 of 32 slices shown (4 of 4)]
[im 1/32]
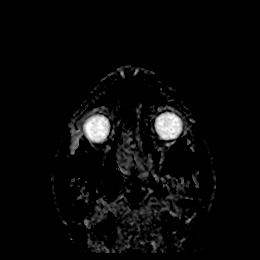
[im 32/32]
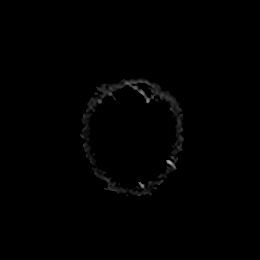

[Series 13: T1 · sagittal · 5.0mm · 0.75mm/px · 1 of 23 slices shown]
[im 1/23]
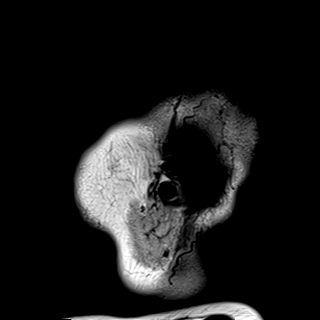

[Series 14: T2 · axial · 5.0mm · 0.72mm/px · 1 of 26 slices shown]
[im 1/26]
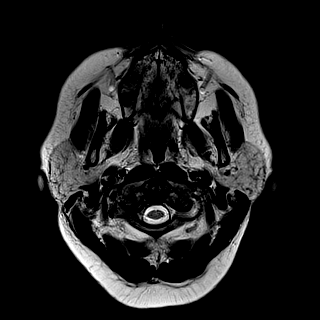

[Series 15: FLAIR · axial · 5.0mm · 0.45mm/px · 1 of 26 slices shown]
[im 1/26]
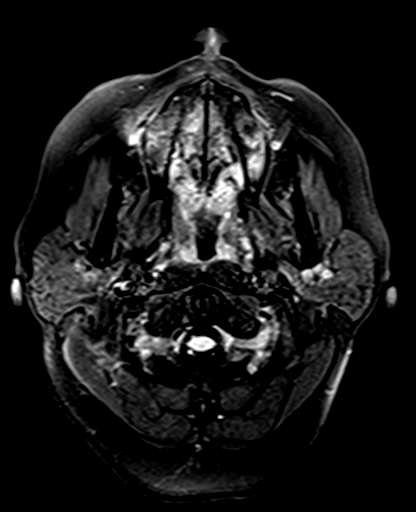

[Series 16: mag_images · axial · 3.0mm · 0.90mm/px · z∈[-90,+85]mm · 3 of 60 slices shown]
[im 1/60]
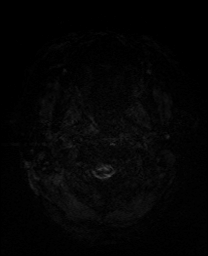
[im 30/60]
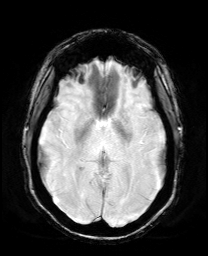
[im 60/60]
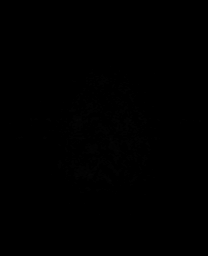

[Series 17: pha_images · axial · 3.0mm · 0.90mm/px · z∈[-90,+82]mm · 3 of 59 slices shown]
[im 1/59]
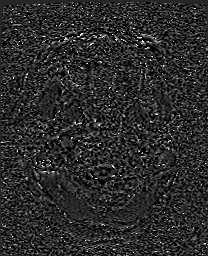
[im 30/59]
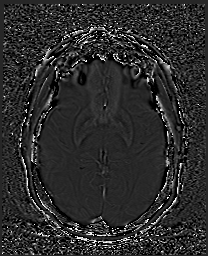
[im 59/59]
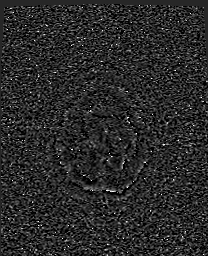

[Series 18: swi_images · axial · 3.0mm · 0.90mm/px · z∈[-90,+85]mm · 3 of 60 slices shown]
[im 1/60]
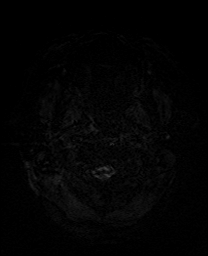
[im 30/60]
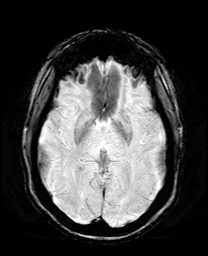
[im 60/60]
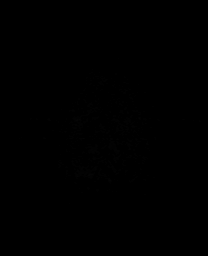

[Series 21: T2 post-contrast · coronal · 5.0mm · 0.72mm/px · 2 of 28 slices shown]
[im 1/28]
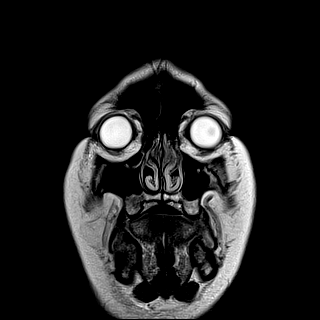
[im 28/28]
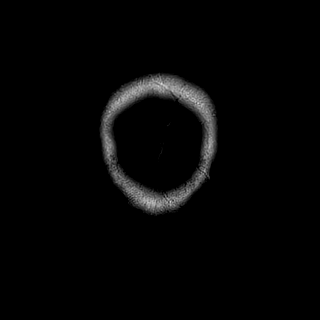

[Series 23: T1 post-contrast · coronal · 5.0mm · 0.34mm/px · 2 of 28 slices shown (1 of 2)]
[im 1/28]
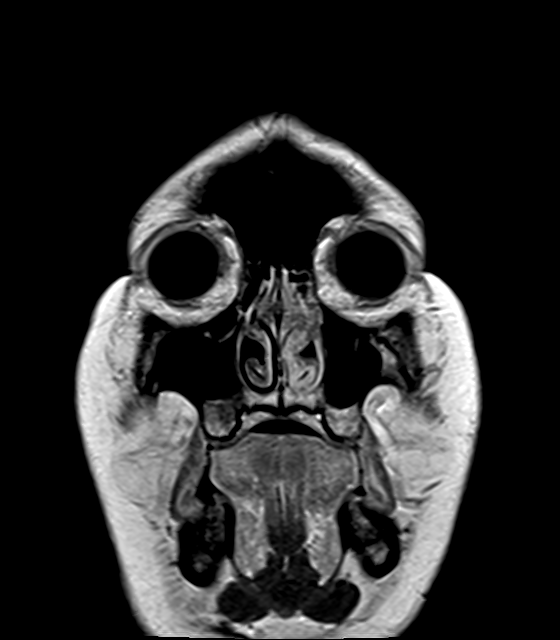
[im 28/28]
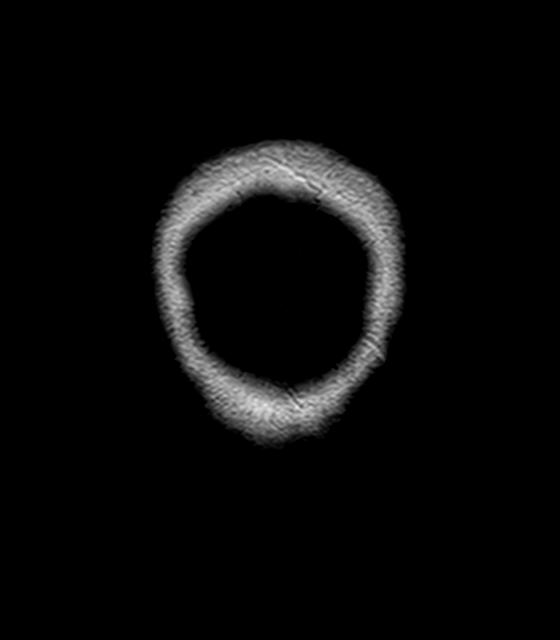

[Series 24: T1 post-contrast · sagittal · 5.0mm · 0.75mm/px · 1 of 23 slices shown (2 of 2)]
[im 1/23]
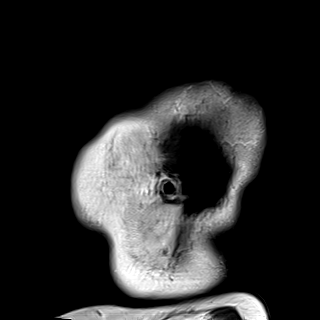

[32 of 48 positions shown; findings below may reference images not displayed]

FINDINGS: MRI HEAD FINDINGS

BRAIN: There is no acute infarct, acute hemorrhage or mass effect.
The midline structures are normal. There are no old infarcts. The
white matter signal is normal for the patient's age. The CSF spaces
are normal for age, with no hydrocephalus. Susceptibility-sensitive
sequences show no chronic microhemorrhage or superficial siderosis.

SKULL AND UPPER CERVICAL SPINE: The visualized skull base,
calvarium, upper cervical spine and extracranial soft tissues are
normal.

SINUSES/ORBITS: No fluid levels or advanced mucosal thickening. No
mastoid or middle ear effusion. The orbits are normal.

MRA HEAD FINDINGS

POSTERIOR CIRCULATION:

--Basilar artery: Normal.

--Posterior cerebral arteries: Normal.  Bilateral fetal origins.

--Superior cerebellar arteries: Normal.

--Inferior cerebellar arteries: Normal posterior inferior cerebellar
arteries. Anterior inferior cerebellar arteries are not clearly
visible, but this is not uncommon.

ANTERIOR CIRCULATION:

--Intracranial internal carotid arteries: Normal.

--Anterior cerebral arteries: Normal. Both A1 segments are present.
Patent anterior communicating artery.

--Middle cerebral arteries: Normal.

--Posterior communicating arteries: Present bilaterally.
IMPRESSION: 1. Normal MRI of the brain.
2. Normal intracranial MRA.

## 2019-03-27 IMAGING — MR MR CERVICAL SPINE WO/W CM
6 of 8 series · 30 of 48 positions shown · IV contrast (Gadavist)
Comparison: None.

CLINICAL DATA: Left arm weakness and numbness. Metastatic lung
cancer.

EXAM:
MRI CERVICAL SPINE WITHOUT AND WITH CONTRAST
TECHNIQUE: Multiplanar and multiecho pulse sequences of the cervical spine, to
include the craniocervical junction and cervicothoracic junction,
were obtained without and with intravenous contrast.
CONTRAST:  10 mL Gadavist

[Series 5: T2 · sagittal · 3.0mm · 0.69mm/px · 3 of 15 slices shown (1 of 2)]
[im 1/15]
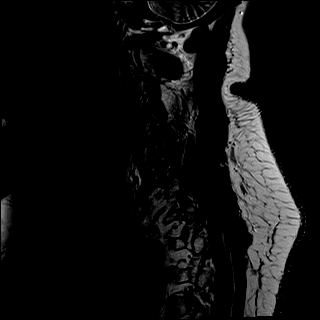
[im 8/15]
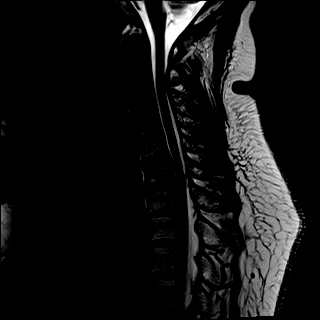
[im 15/15]
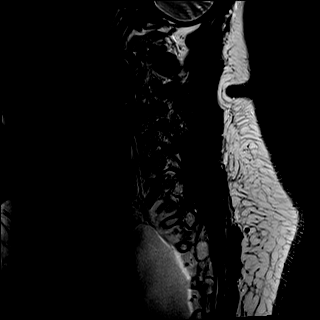

[Series 6: T1 · sagittal · 3.0mm · 0.69mm/px · 3 of 15 slices shown (1 of 2)]
[im 1/15]
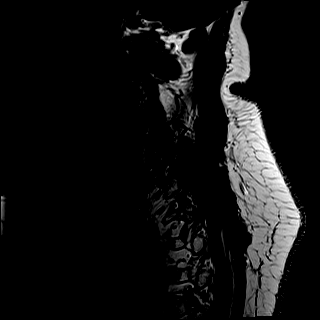
[im 8/15]
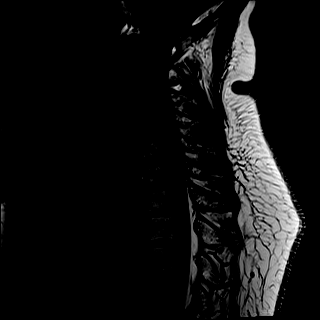
[im 15/15]
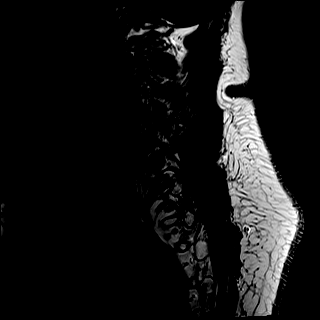

[Series 7: STIR · sagittal · 3.0mm · 0.86mm/px · 3 of 15 slices shown]
[im 1/15]
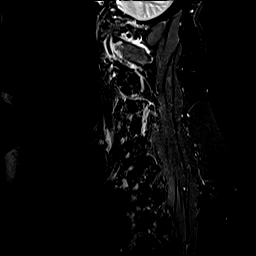
[im 8/15]
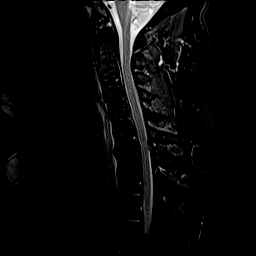
[im 15/15]
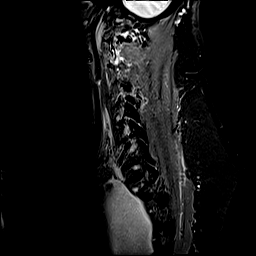

[Series 8: T2 · axial · 3.0mm · 0.66mm/px · z∈[-204,-86]mm · 9 of 40 slices shown (2 of 2)]
[im 1/40]
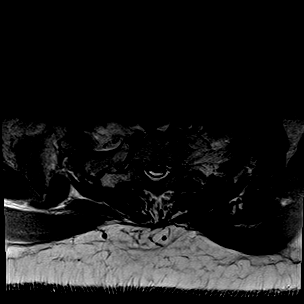
[im 5/40]
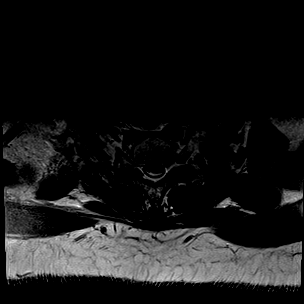
[im 10/40]
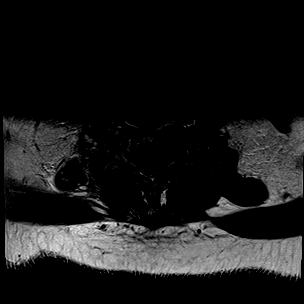
[im 15/40]
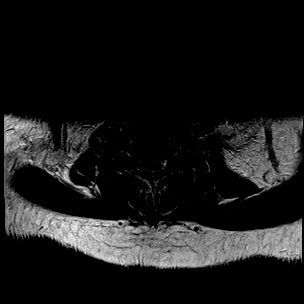
[im 20/40]
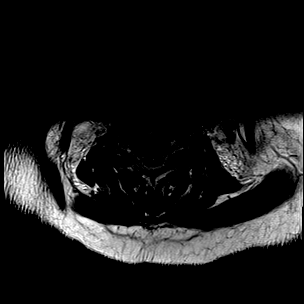
[im 25/40]
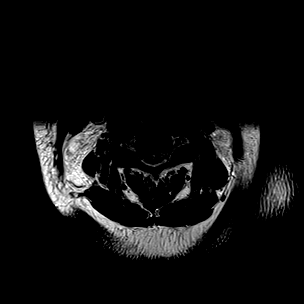
[im 30/40]
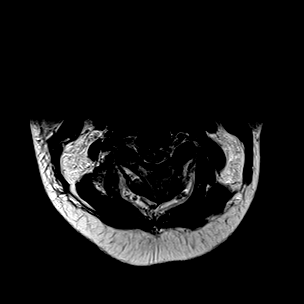
[im 35/40]
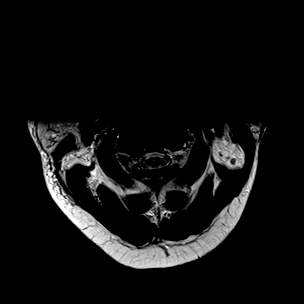
[im 40/40]
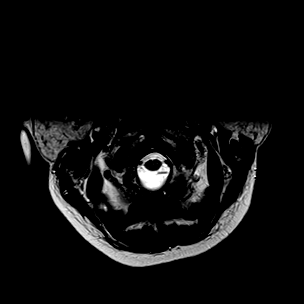

[Series 10: T1 · axial · 3.0mm · 0.39mm/px · z∈[-204,-86]mm · 9 of 40 slices shown (2 of 2)]
[im 1/40]
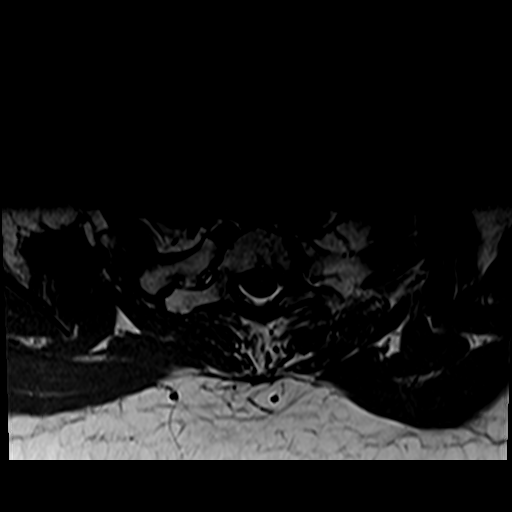
[im 5/40]
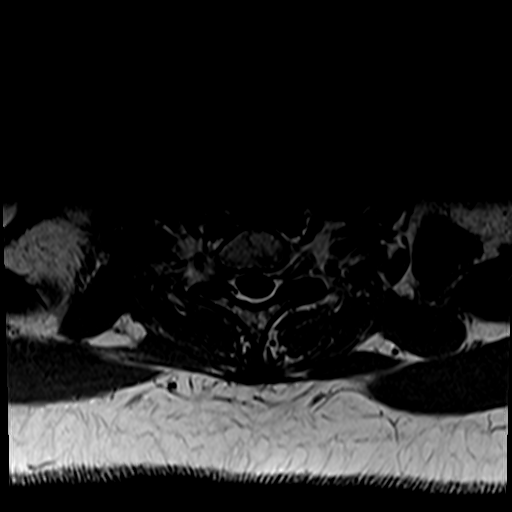
[im 10/40]
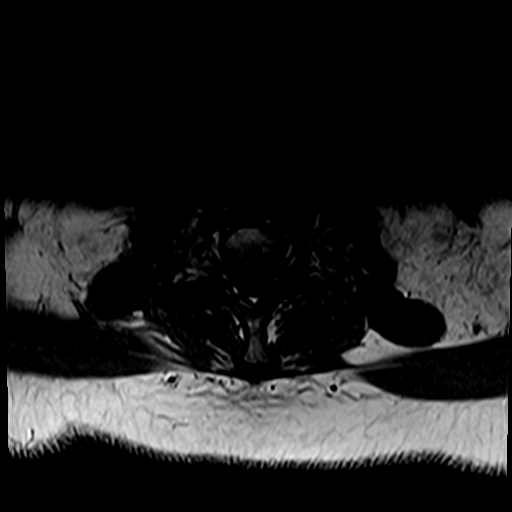
[im 15/40]
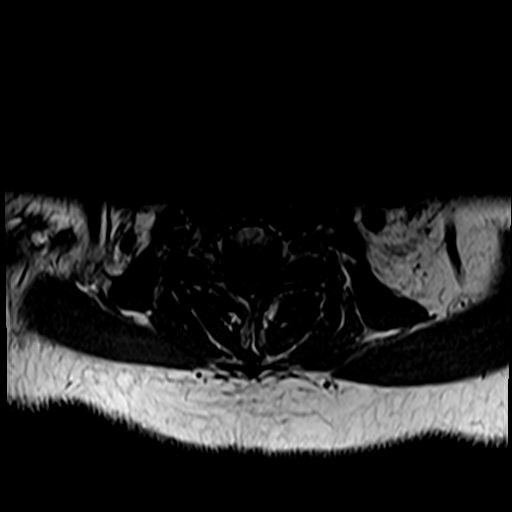
[im 20/40]
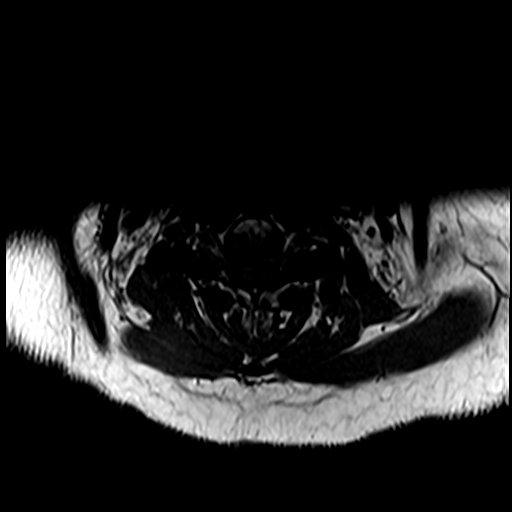
[im 25/40]
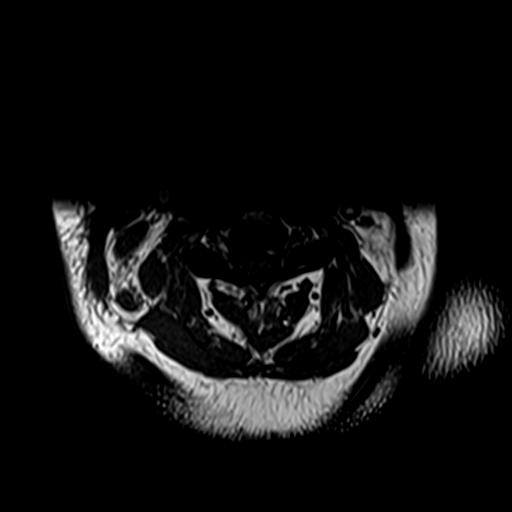
[im 30/40]
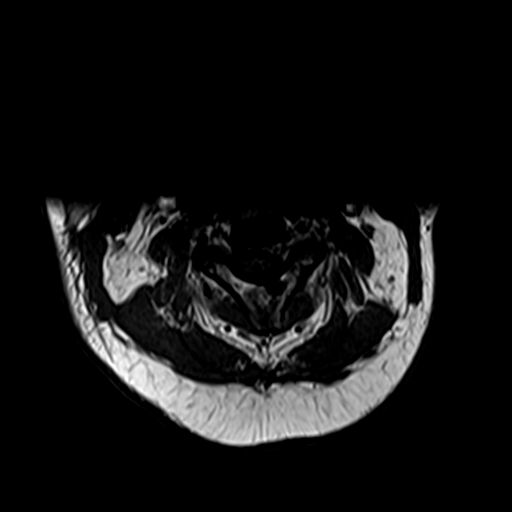
[im 35/40]
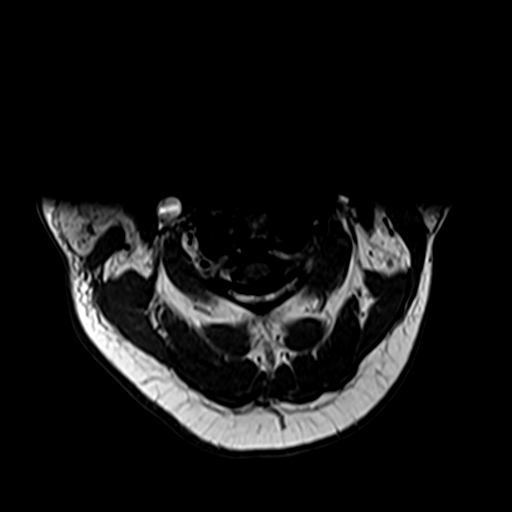
[im 40/40]
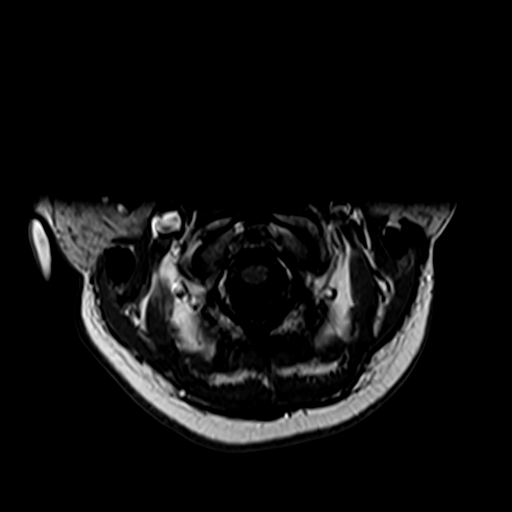

[Series 11: T1 fat-sat post-contrast · sagittal · 3.0mm · 0.43mm/px · 3 of 15 slices shown]
[im 1/15]
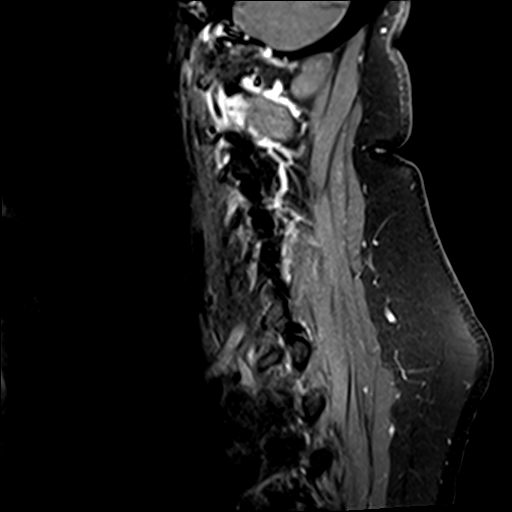
[im 8/15]
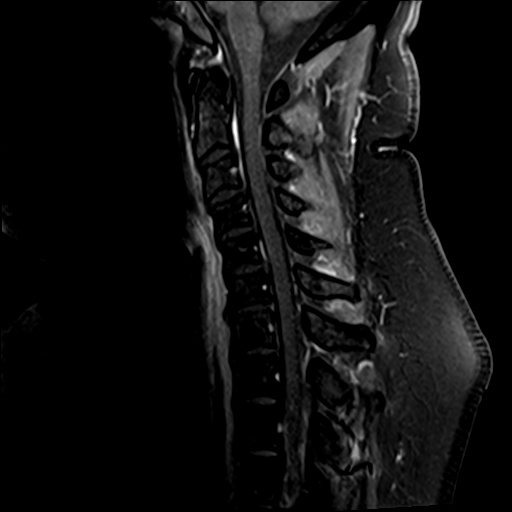
[im 15/15]
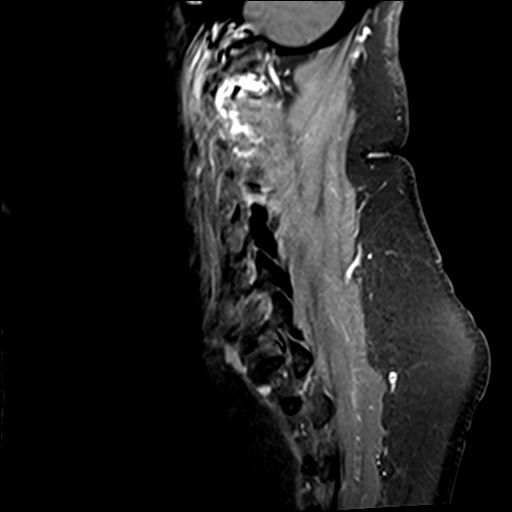

[30 of 48 positions shown; findings below may reference images not displayed]

FINDINGS: Alignment: Normal.

Vertebrae: No focal marrow lesion. No compression fracture or
evidence of discitis osteomyelitis.

Cord: Normal caliber and signal.

Posterior Fossa, vertebral arteries, paraspinal tissues: Visualized
posterior fossa is normal. Vertebral artery flow voids are
preserved. No prevertebral effusion.

Disc levels: Sagittal imaging includes the atlantoaxial joint to the
level of the T3-4 disc space, with axial imaging of the disc spaces
from C2-3 to C7-T1.

Atlantoaxial articulation is normal.

C2-3: Normal.

C3-4: Normal.

C4-5: Normal.

C5-6: Small central disc protrusion with mild spinal canal stenosis.
No neural foraminal stenosis.

C6-7: Left subarticular disc protrusion causes mild spinal canal
stenosis and moderate left neural foraminal stenosis. Right neural
foramen is patent.

C7-T1: Normal.

Sagittal imaging of the T1-T4 levels is normal.
IMPRESSION: 1. No cervical metastatic disease.
2. Unchanged mild spinal canal stenosis at C5-6 and C6-7.
3. Unchanged moderate left neural foraminal stenosis at C6-7.

## 2019-03-28 ENCOUNTER — Other Ambulatory Visit: Payer: Medicare Other

## 2019-03-28 ENCOUNTER — Ambulatory Visit: Payer: Medicare Other | Admitting: Hematology

## 2019-04-06 DEATH — deceased

## 2019-04-21 IMAGING — CT CT CHEST W/O CM
2 of 3 series · 14 of 36 positions shown, 17 images · non-contrast
Comparison: 04/03/2018 chest CT angiogram.

CLINICAL DATA: Right lung adenocarcinoma status post chemotherapy
and radiation therapy with ongoing immunotherapy. Worsening dyspnea.
History of palliative radiation therapy to the right chest.

EXAM:
CT CHEST WITHOUT CONTRAST
TECHNIQUE: Multidetector CT imaging of the chest was performed following the
standard protocol without IV contrast.

[Series 2: thorax · axial · 0.81mm/px · z∈[-13,+265]mm · 11 of 165 slices shown, 14 images]
[im 13/165  mediastinal]
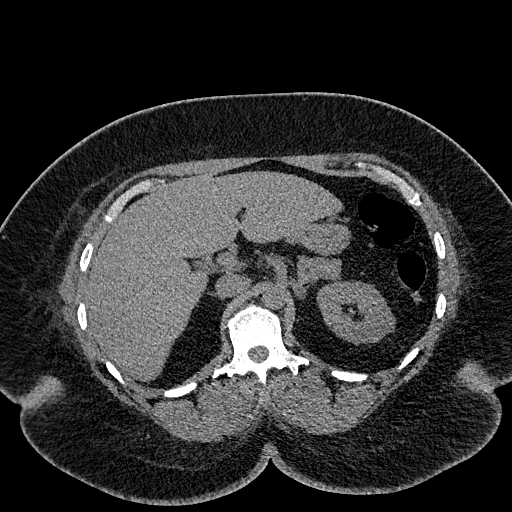
[im 13/165  lung]
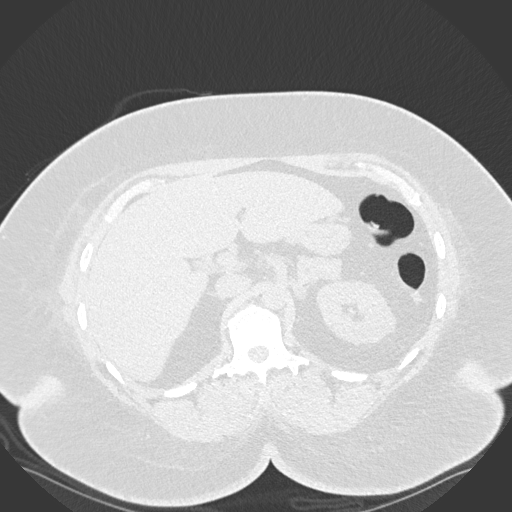
[im 25/165  lung]
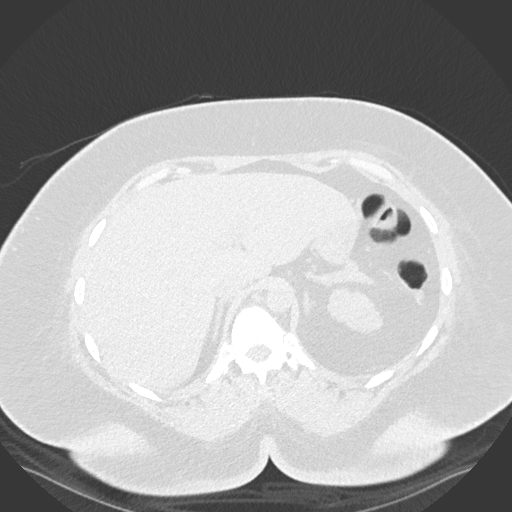
[im 37/165  lung]
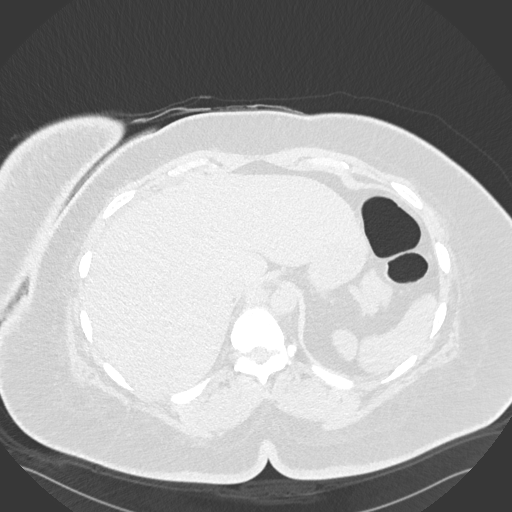
[im 55/165  lung]
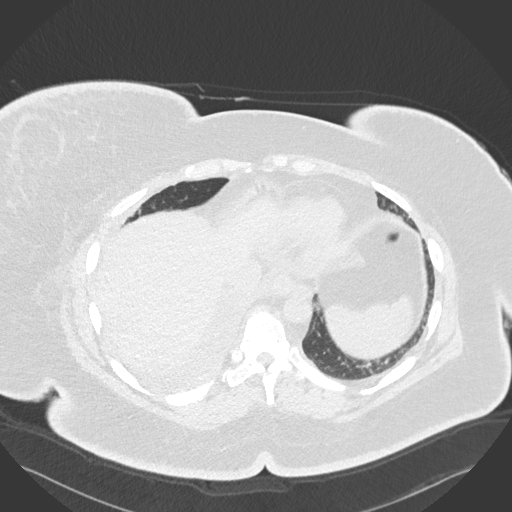
[im 67/165  mediastinal]
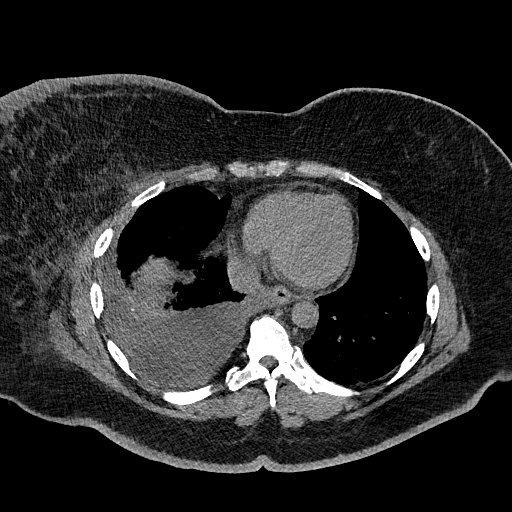
[im 67/165  lung]
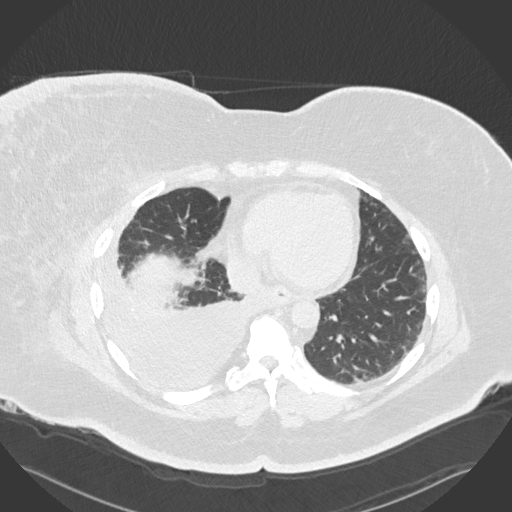
[im 86/165  lung]
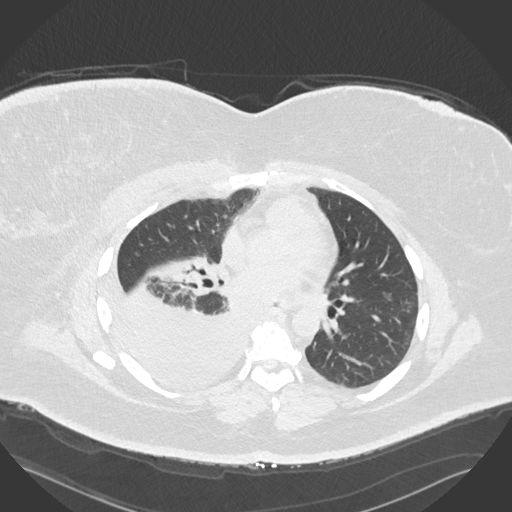
[im 98/165  lung]
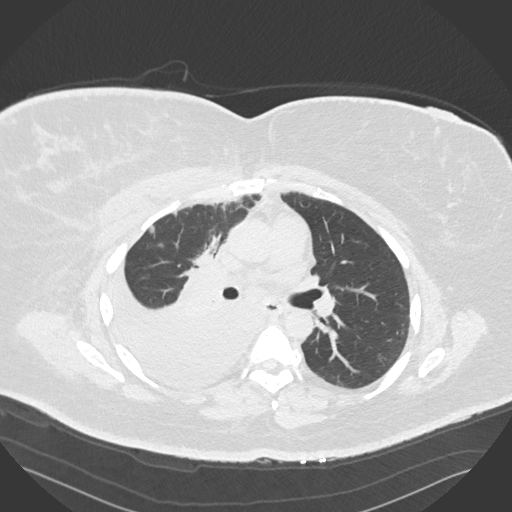
[im 110/165  lung]
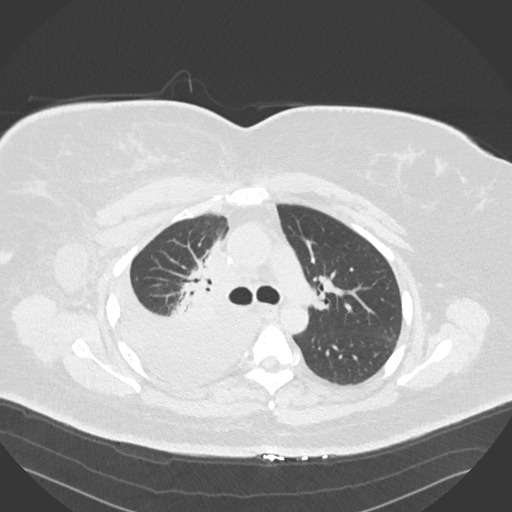
[im 128/165  mediastinal]
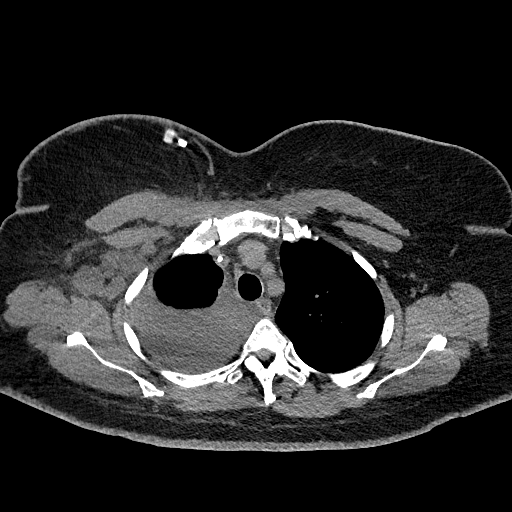
[im 128/165  lung]
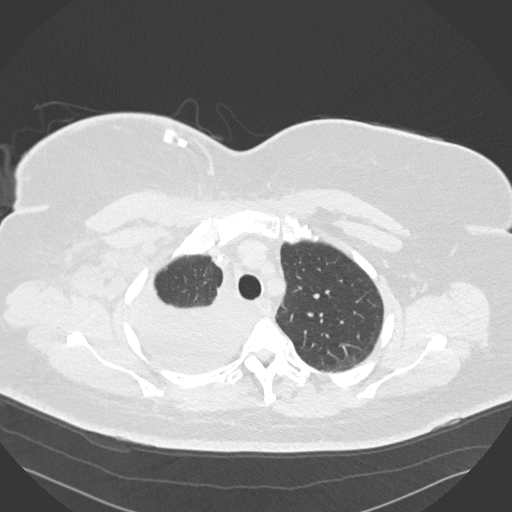
[im 140/165  lung]
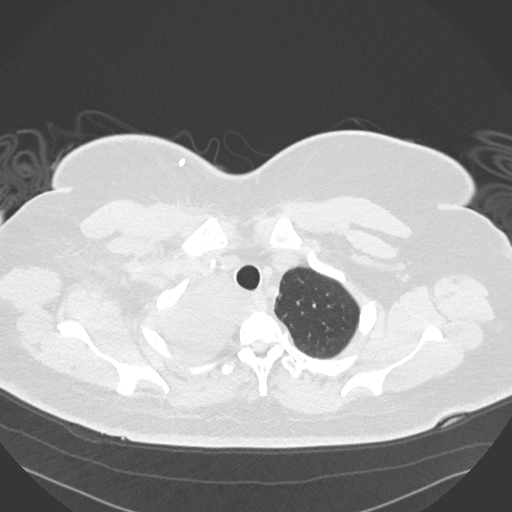
[im 152/165  lung]
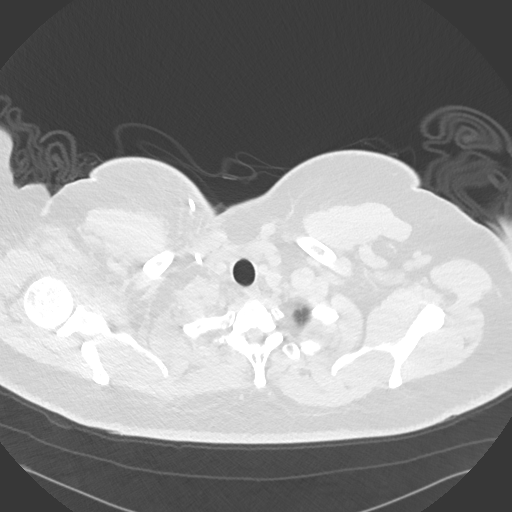

[Series 6: coronal · coronal · 0.66mm/px · 3 of 142 slices shown]
[im 29/142  lung]
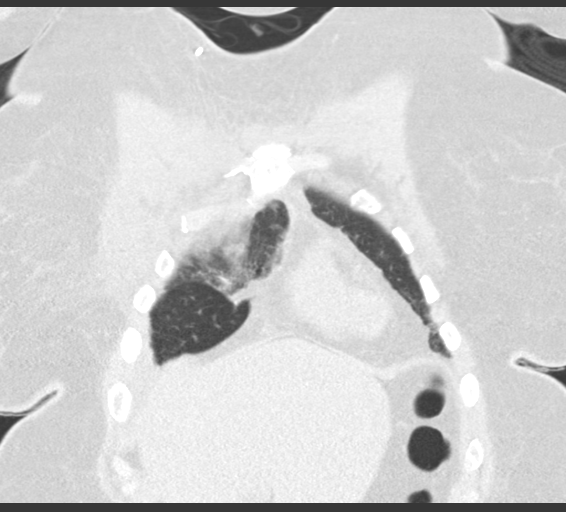
[im 57/142  lung]
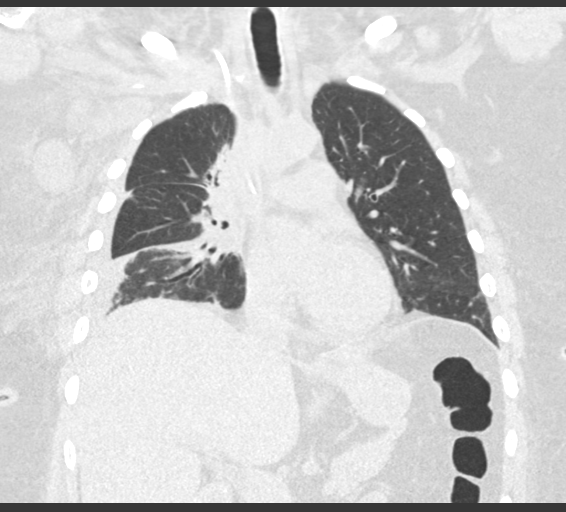
[im 85/142  lung]
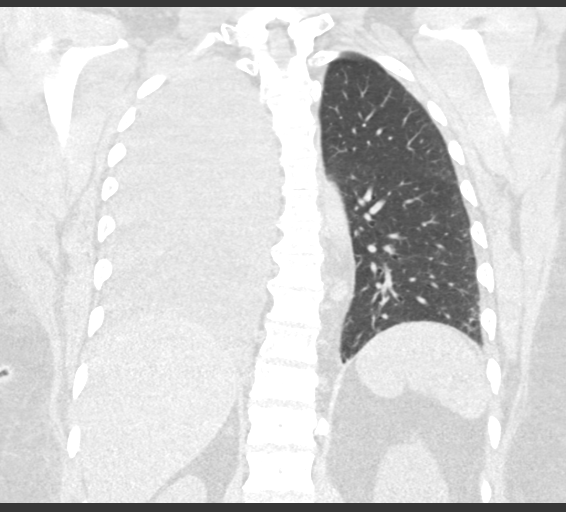

[14 of 36 positions shown; findings below may reference images not displayed]

FINDINGS: Cardiovascular: Normal heart size. No significant pericardial
effusion/thickening. Right internal jugular Port-A-Cath terminates
in the middle third of the SVC. Left anterior descending coronary
atherosclerosis. Atherosclerotic nonaneurysmal thoracic aorta.
Normal caliber pulmonary arteries.

Mediastinum/Nodes: No discrete thyroid nodules. Unremarkable
esophagus. Right axillary adenopathy is decreased, for example a
cm right axillary node (series 2/image 51), decreased from 3.5 cm.
New left supraclavicular adenopathy measuring up to 1.5 cm (series
2/image 9). No left axillary adenopathy. Stable enlarged 1.9 cm
right paratracheal node (series 2/image 49). Mildly increased 1.5 cm
right retrocrural node (series 2/image 126), previously 1.2 cm.
Newly enlarged 1.0 cm left para-aortic posterior mediastinal node
(series 2/image 96). No discrete hilar adenopathy on this
noncontrast scan.

Lungs/Pleura: No pneumothorax. Moderate dependent right pleural
effusion, significantly increased. No left pleural effusion. Stable
sharply marginated right perihilar consolidation with associated
volume loss, distortion and bronchiectasis, compatible with
radiation fibrosis. Increased passive atelectasis in the dependent
right lung. New patchy nodular consolidation in the peripheral right
middle lobe measuring up to 2.1 cm (series 5/image 64). Patchy
finely nodular thickening of peripheral peribronchovascular
interstitium and interlobular septa in the lower lungs bilaterally,
similar.

Upper abdomen: Vague low-attenuation subcapsular foci adjacent to
the intersegmental fissure and at the left liver dome are unchanged
since 08/03/2017 PET-CT, where they were non FDG avid, most
compatible with benign focal fat.

Musculoskeletal: No aggressive appearing focal osseous lesions.
Moderate thoracic spondylosis. Increased nonspecific asymmetric skin
thickening throughout the right breast.
IMPRESSION: 1. Mixed interval change.
2. Moderate dependent right pleural effusion, significantly
increased.
3. Right axillary adenopathy is decreased.
4. New left supraclavicular and left posterior mediastinal
lymphadenopathy. Increased right retrocrural adenopathy. These
findings are worrisome for progression of metastatic nodal disease.
5. New mild patchy nodular consolidation in the peripheral right
middle lobe, which could be inflammatory or neoplastic.
6. Similar finely nodular patchy thickening of the peripheral
peribronchovascular interstitium and interlobular septa in the lower
lungs bilaterally, nonspecific, lymphangitic tumor not excluded.
7. Nonspecific increased right breast skin thickening, possibly
treatment related.

Aortic Atherosclerosis ([id number]-[id number]).

These results will be called to the ordering clinician or
representative by the Radiologist Assistant, and communication
documented in the PACS or zVision Dashboard.

## 2019-04-22 IMAGING — US IR THORACENTESIS ASP PLEURAL SPACE W/IMG GUIDE
1 series · 3 of 3 positions shown · non-contrast
Comparison: none

INDICATION: Right-sided lung cancer. Shortness of breath. Right-sided pleural
effusion. Request for diagnostic and therapeutic thoracentesis.

[Series 1: ir (id) (id)/(id)/(id) ir · 3 of 3 slices shown]
[im 1/3]
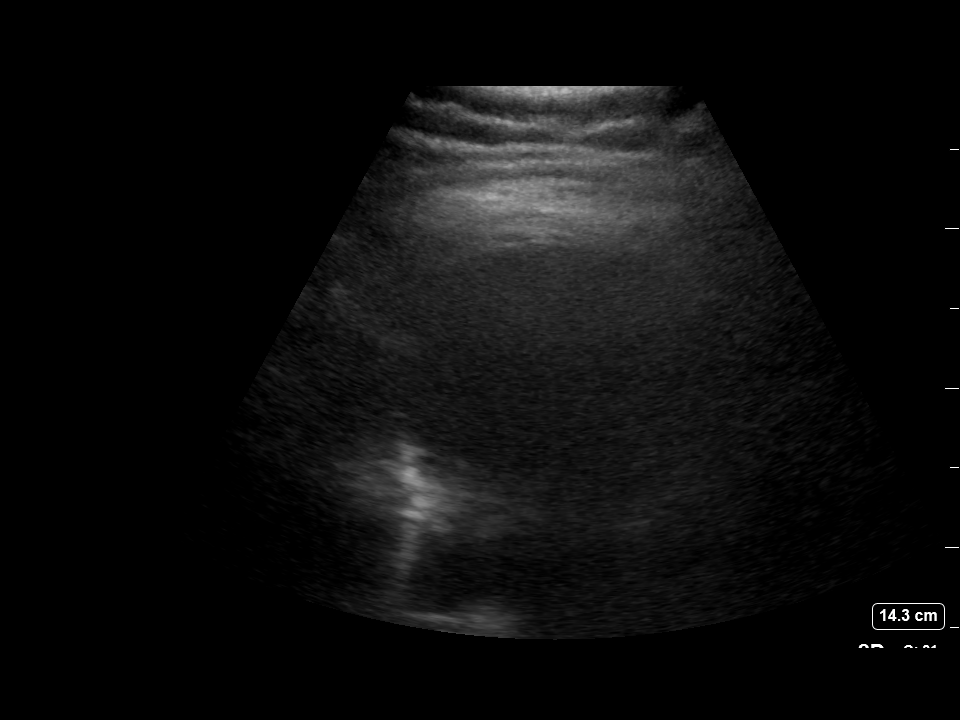
[im 2/3]
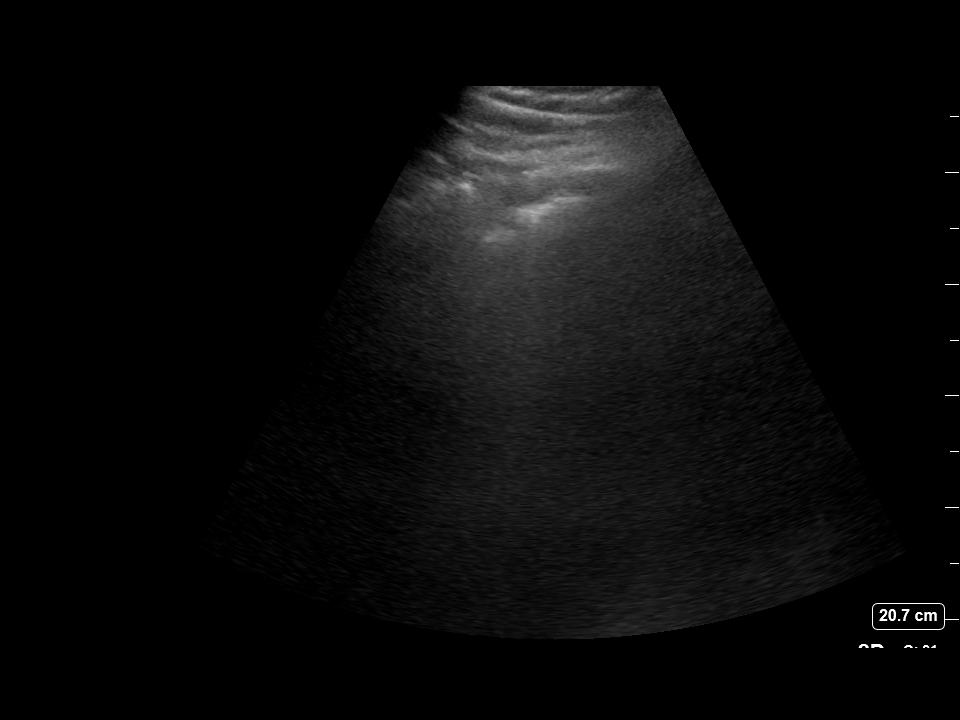
[im 3/3]
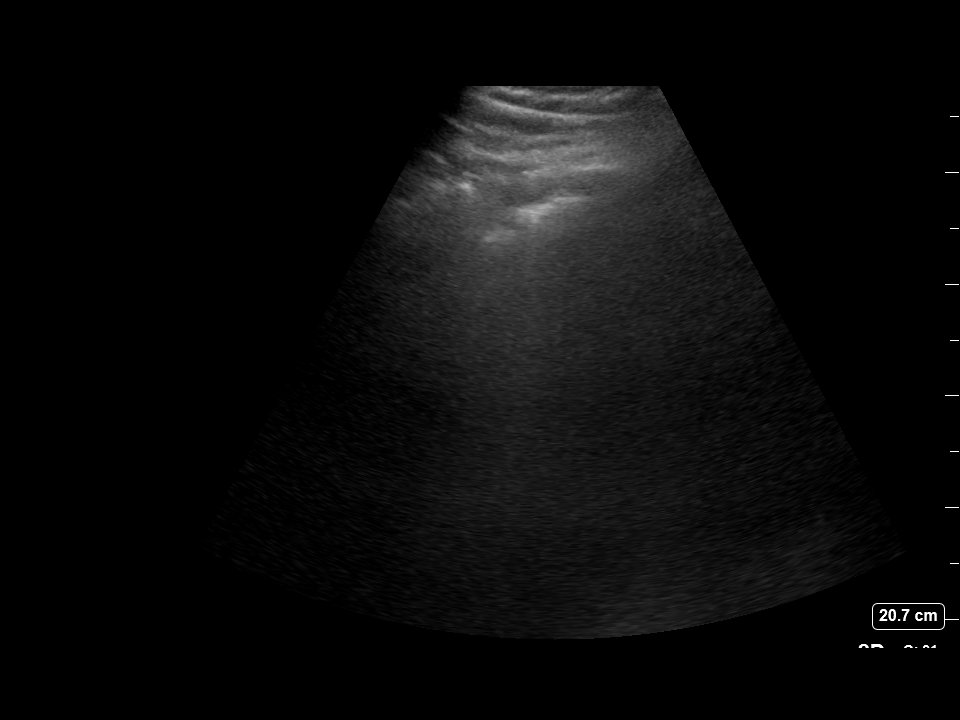

[3 of 3 positions shown; findings below may reference images not displayed]

EXAM:
ULTRASOUND GUIDED RIGHT THORACENTESIS

MEDICATIONS:
None.

COMPLICATIONS:
None immediate.

PROCEDURE:
An ultrasound guided thoracentesis was thoroughly discussed with the
patient and questions answered. The benefits, risks, alternatives
and complications were also discussed. The patient understands and
wishes to proceed with the procedure. Written consent was obtained.

Ultrasound was performed to localize and mark an adequate pocket of
fluid in the right chest. The area was then prepped and draped in
the normal sterile fashion. 1% Lidocaine was used for local
anesthesia. Under ultrasound guidance a 6 Fr Safe-T-Centesis
catheter was introduced. Thoracentesis was performed. The catheter
was removed and a dressing applied.
FINDINGS: A total of approximately 270 mL of clear yellow fluid was removed.
Samples were sent to the laboratory as requested by the clinical
team.
IMPRESSION: Successful ultrasound guided right thoracentesis yielding 270 mL of
pleural fluid.

No pneumothorax on follow-up chest radiograph.

## 2019-04-22 IMAGING — DX DG CHEST 1V
1 series · 1 of 1 positions shown · non-contrast
Comparison: 04/03/2018

CLINICAL DATA: Pleural effusion; post thoracentesis. Hx of PNA,
NICM, metastatic lung cancer, HTN, DM, AND asthma. Pt is a current
smoker.

EXAM:
CHEST  1 VIEW

[x chest ap]
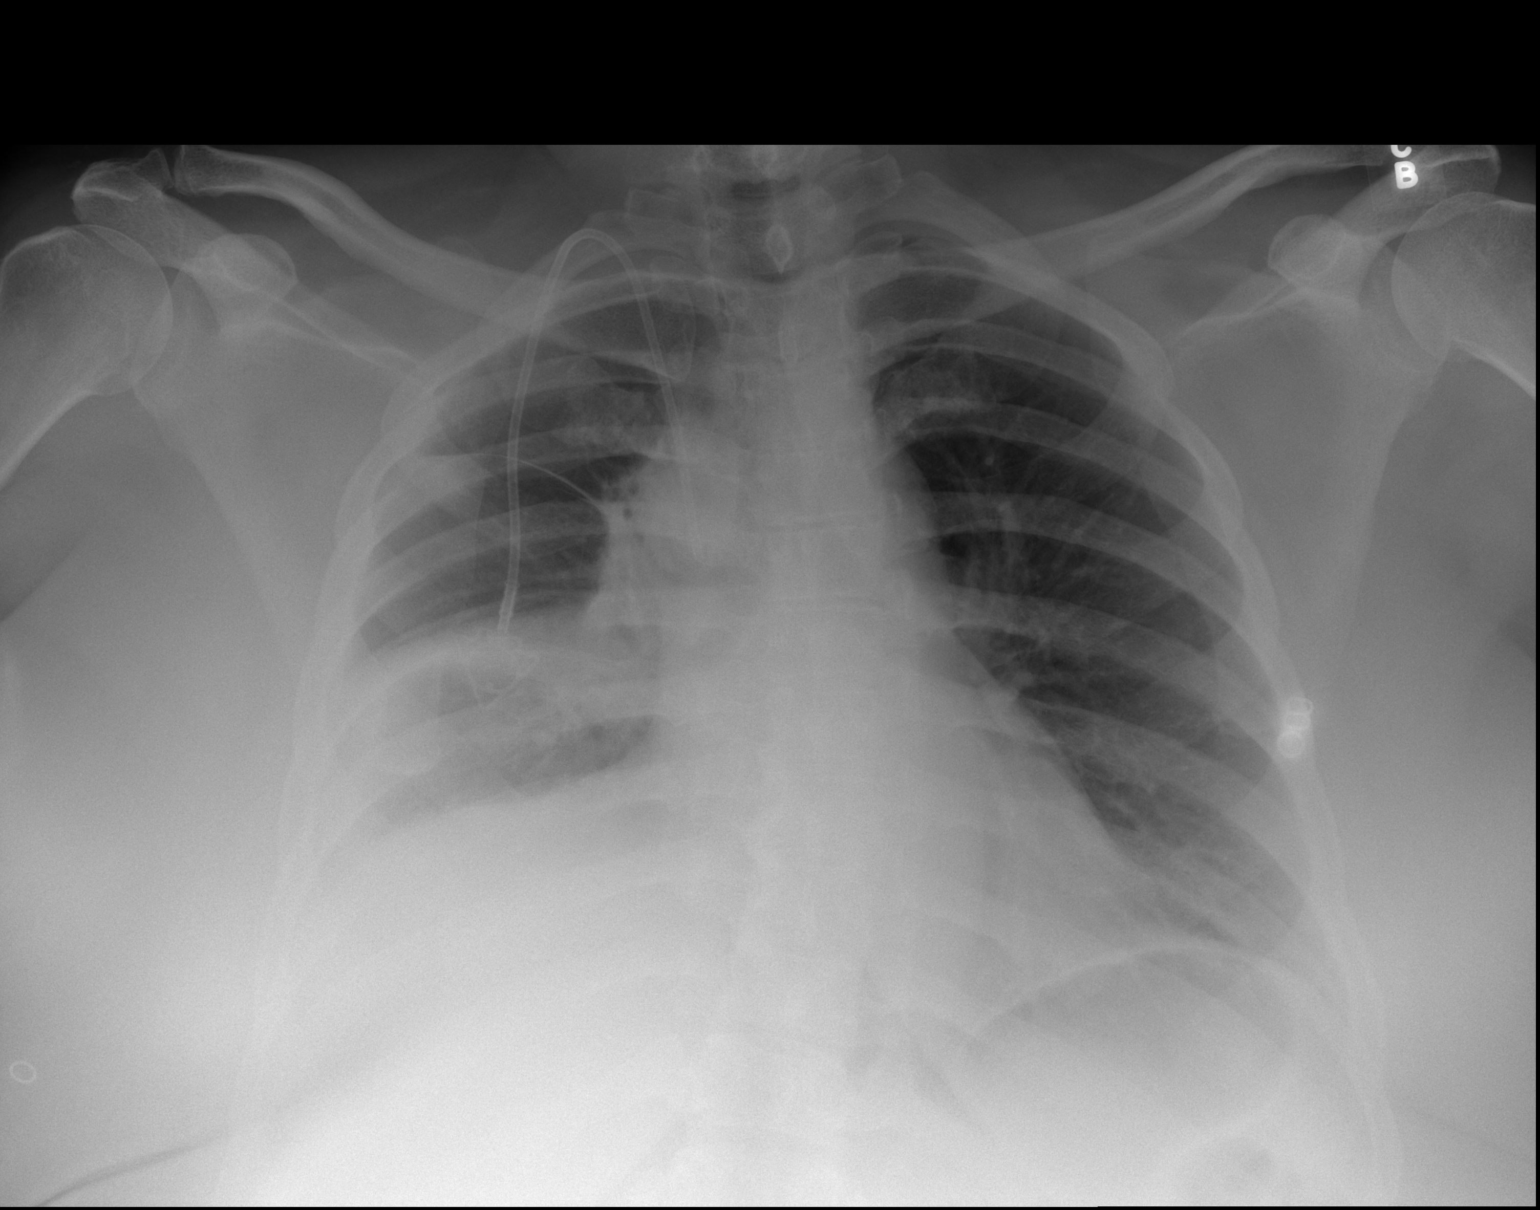

[1 of 1 positions shown; findings below may reference images not displayed]

FINDINGS: No pneumothorax. Small to moderate residual right pleural effusion
with consolidation/atelectasis in the right lower lung. Progressive
right perihilar masslike consolidation. Left lung clear.

Heart size normal. Right IJ port catheter to the mid SVC.

Visualized bones unremarkable.
IMPRESSION: No pneumothorax post right thoracentesis.

## 2019-04-27 IMAGING — CT CT ABDOMEN W/O CM
2 of 4 series · 16 of 46 positions shown, 18 images · non-contrast
Comparison: CT abdomen/pelvis dated 03/01/2018. Partial comparison
to CT chest dated 05/24/2018.

CLINICAL DATA: Right upper quadrant pain, evaluate for
cholecystitis. History of lung cancer, status post XRT and
chemotherapy. Immunotherapy stopped today.

EXAM:
CT ABDOMEN WITHOUT CONTRAST
TECHNIQUE: Multidetector CT imaging of the abdomen was performed following the
standard protocol without IV contrast.

[Series 2: axial st · axial · 0.80mm/px · z∈[-306,-66]mm · 13 of 58 slices shown, 15 images]
[im 5/58  soft-tissue]
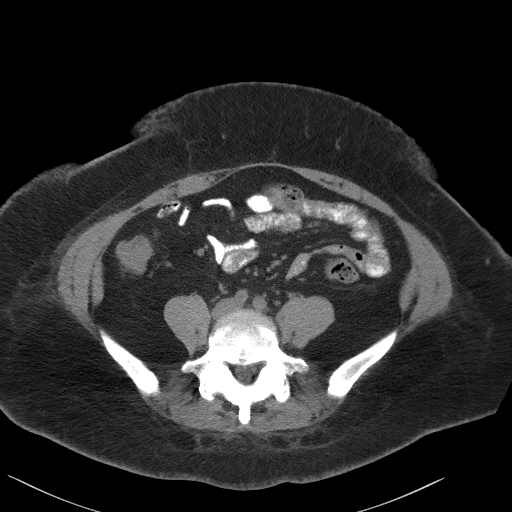
[im 5/58  bone]
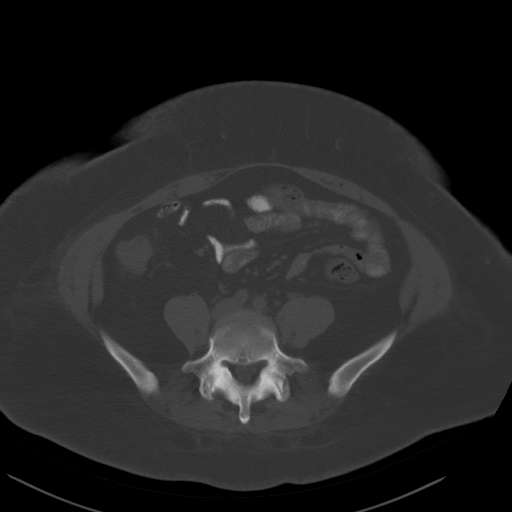
[im 9/58  soft-tissue]
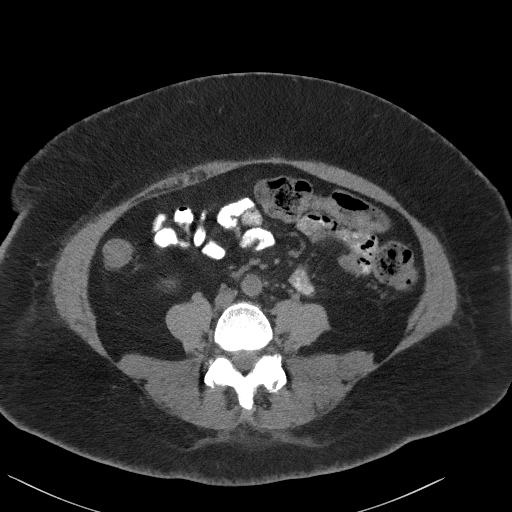
[im 13/58  soft-tissue]
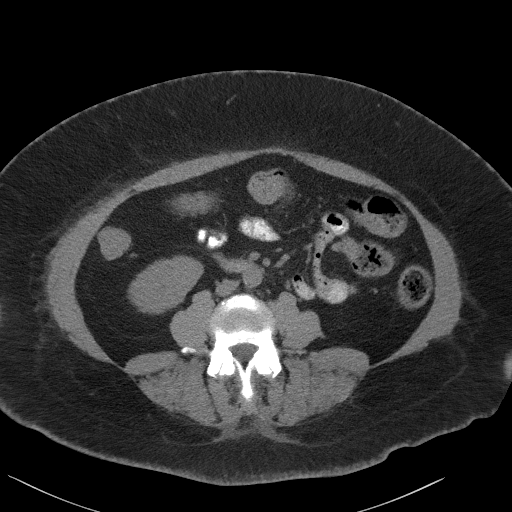
[im 17/58  soft-tissue]
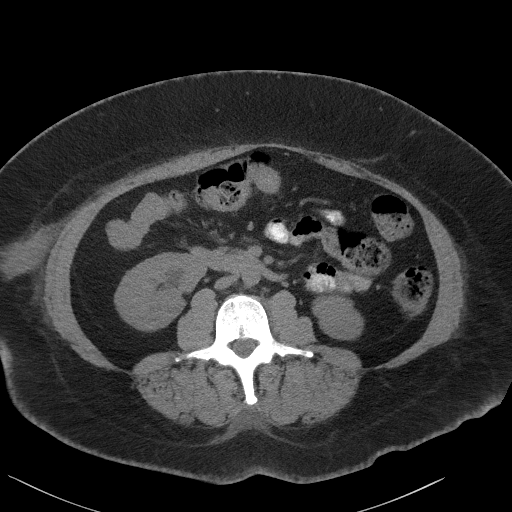
[im 21/58  soft-tissue]
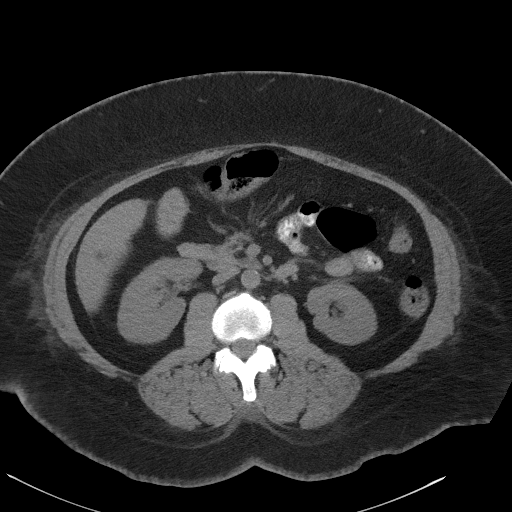
[im 25/58  soft-tissue]
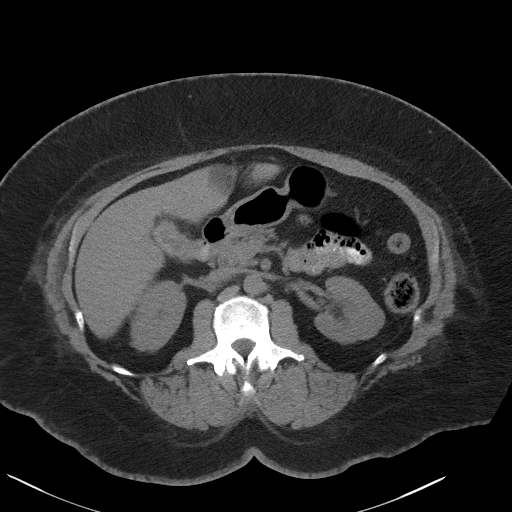
[im 29/58  soft-tissue]
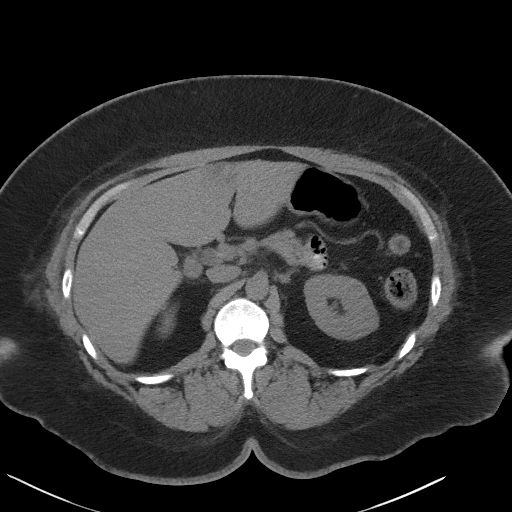
[im 33/58  soft-tissue]
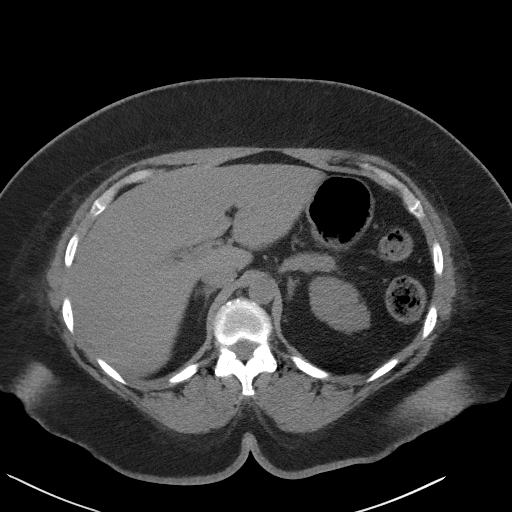
[im 37/58  soft-tissue]
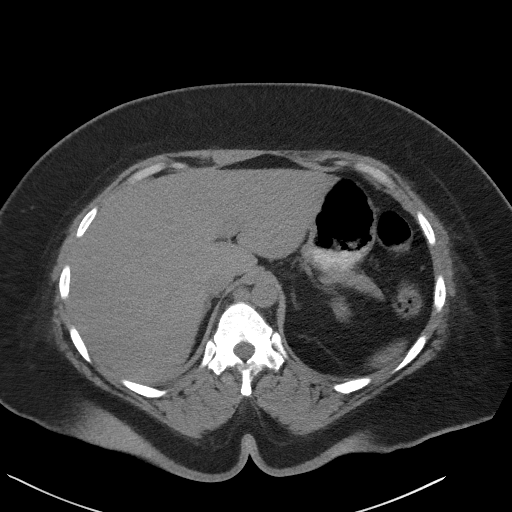
[im 37/58  bone]
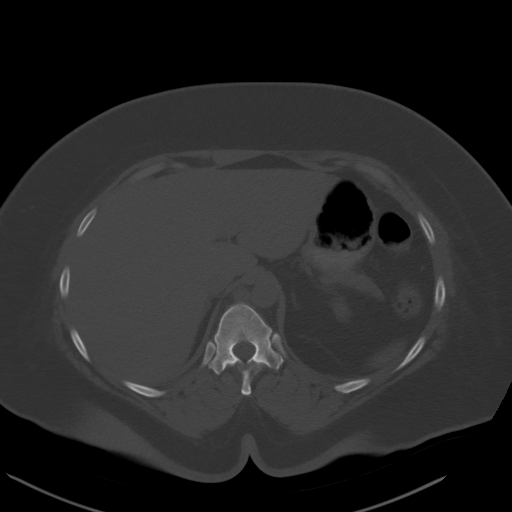
[im 41/58  soft-tissue]
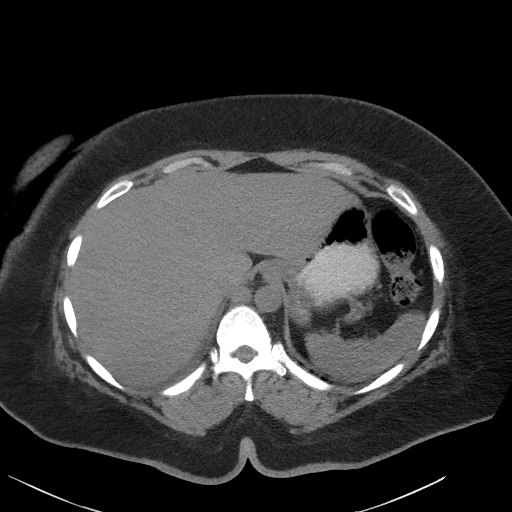
[im 45/58  soft-tissue]
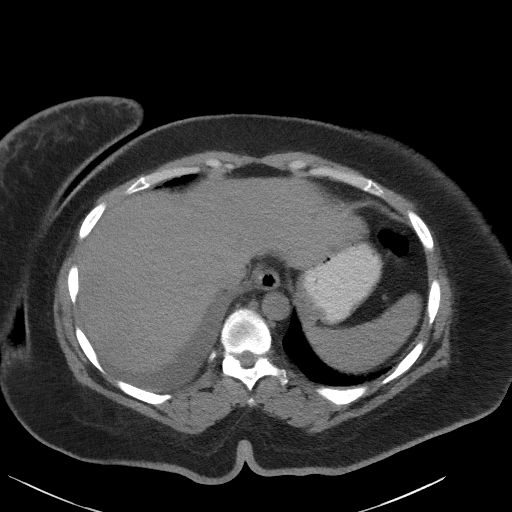
[im 49/58  soft-tissue]
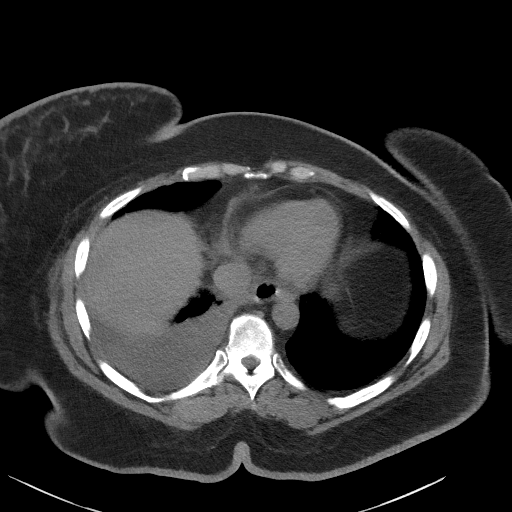
[im 53/58  soft-tissue]
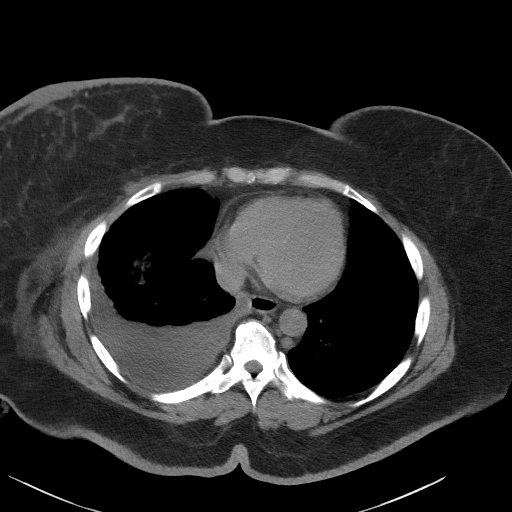

[Series 4: coronal st · coronal · 0.64mm/px · 3 of 86 slices shown]
[im 29/86  soft-tissue]
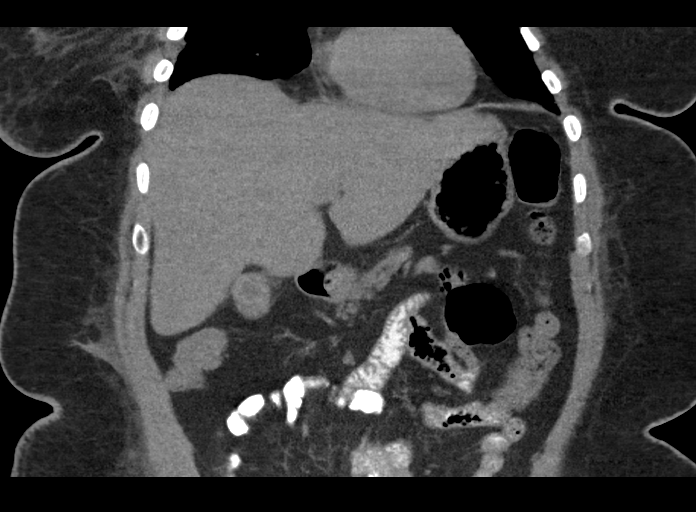
[im 38/86  soft-tissue]
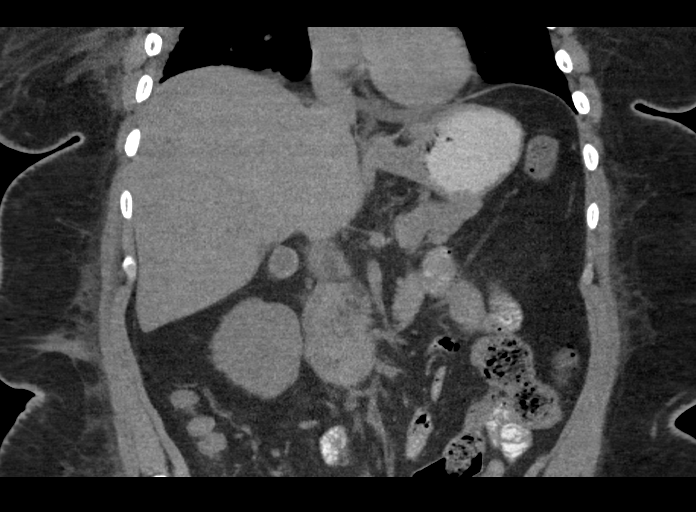
[im 48/86  soft-tissue]
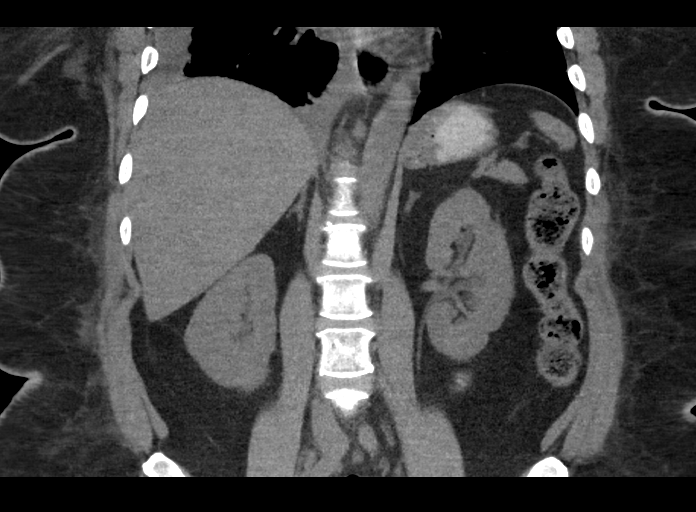

[16 of 46 positions shown; findings below may reference images not displayed]

FINDINGS: Lower chest: Moderate right pleural effusion, unchanged from recent
CT, new from prior CT abdomen/pelvis.

Hepatobiliary: Focal fat/altered perfusion along the falciform
ligament (series 2/image 33).

Cholelithiasis, without associated inflammatory changes to suggest
acute cholecystitis. No intrahepatic or extrahepatic ductal
dilatation.

Pancreas: Within normal limits.

Spleen: Within normal limits.

Adrenals/Urinary Tract: Adrenal glands are within normal limits.

Kidneys are within normal limits. Mild fullness of the right renal
collecting system without frank hydronephrosis.

Stomach/Bowel: Stomach is within normal limits.

Visualized bowel is grossly unremarkable.

Vascular/Lymphatic: No evidence of abdominal aortic aneurysm.

No suspicious abdominal lymphadenopathy.

Other: No abdominal ascites.

Musculoskeletal: Mild degenerative changes of the lower thoracic
spine.
IMPRESSION: Cholelithiasis, without associated inflammatory changes to suggest
acute cholecystitis.

Moderate right pleural effusion.

Please note that the pelvis was not imaged.

## 2019-06-15 IMAGING — CR CHEST - 2 VIEW
2 series · 2 of 2 positions shown · non-contrast
Comparison: May 25, 2018

CLINICAL DATA: Shortness of breath.  Lung carcinoma

EXAM:
CHEST - 2 VIEW

[w chest pa]
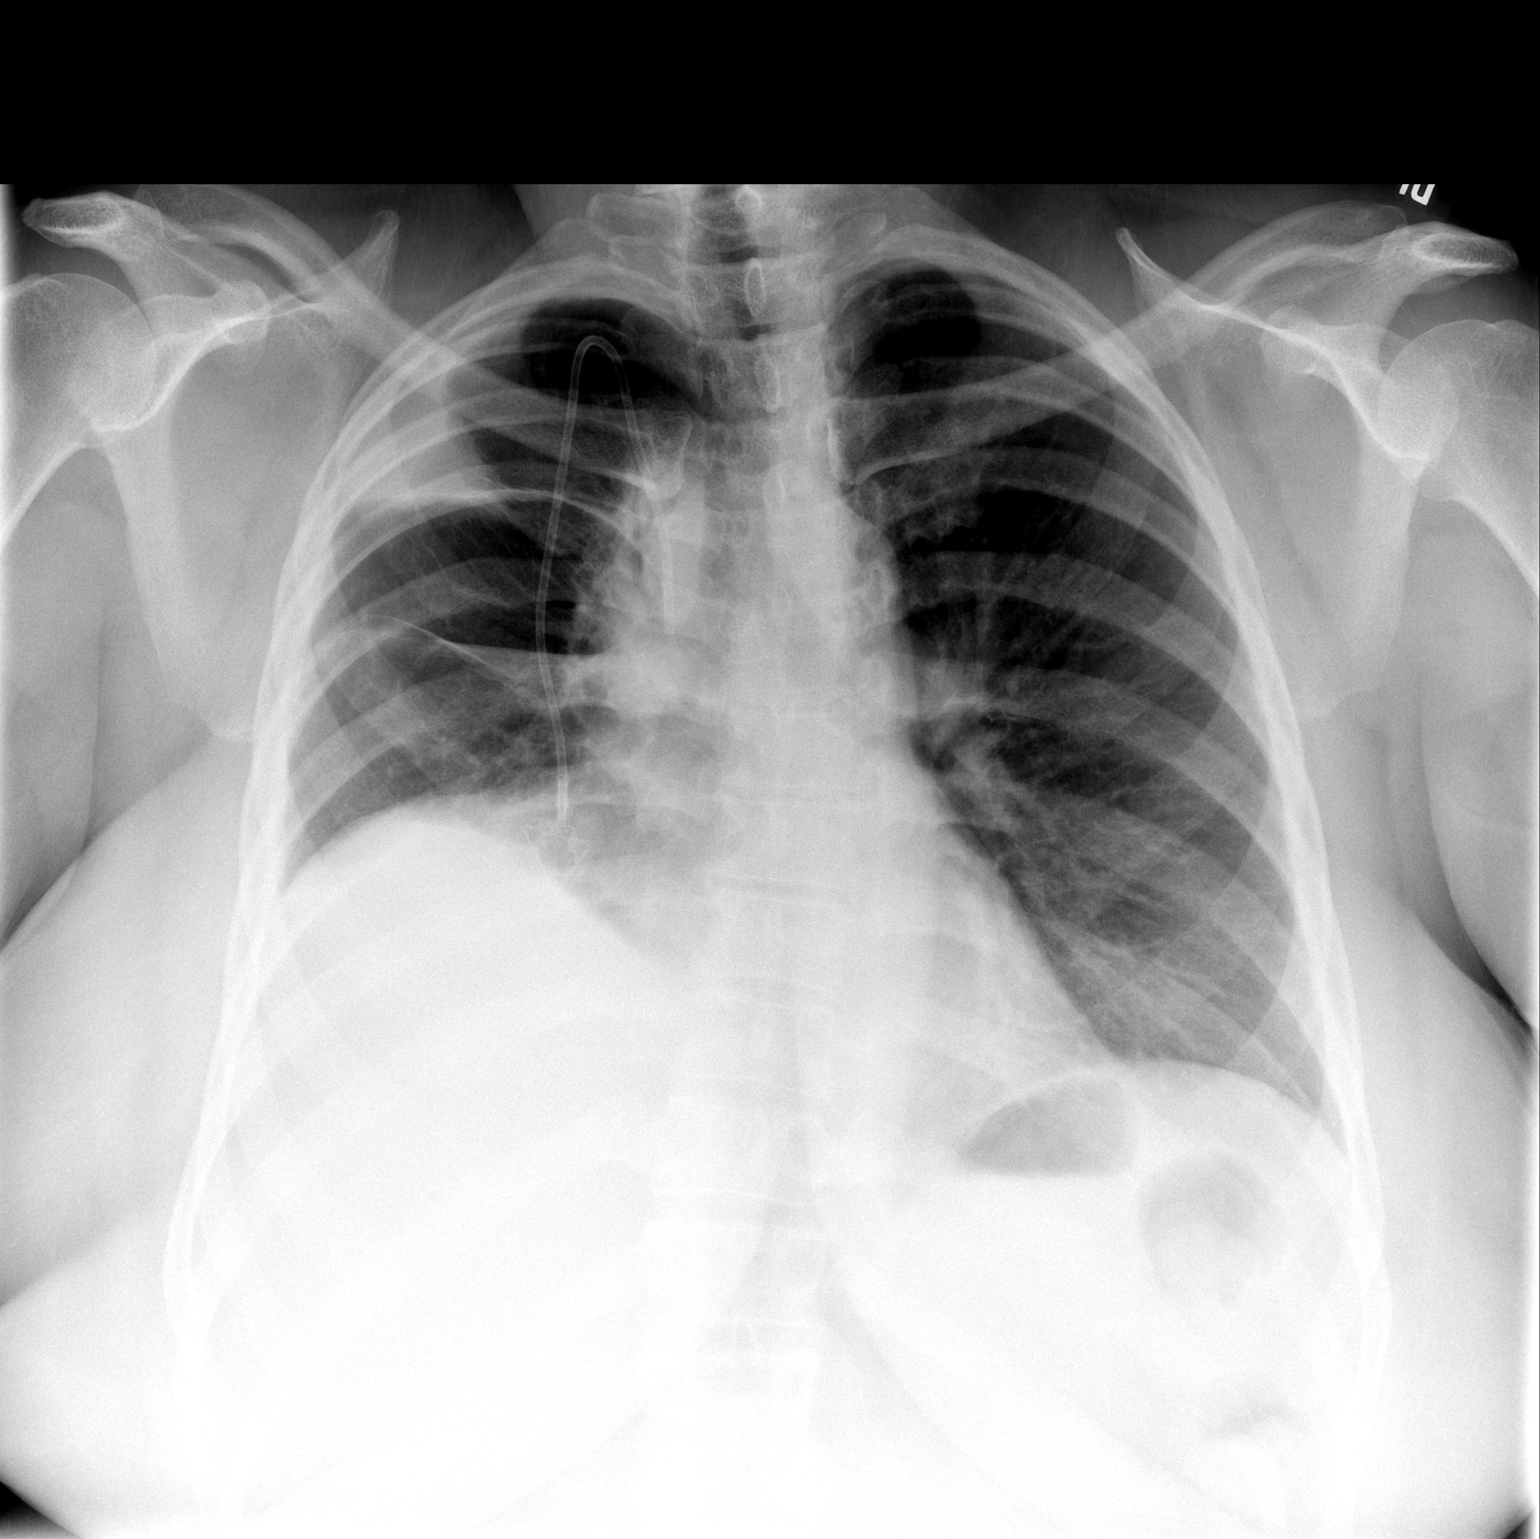

[w chest lat]
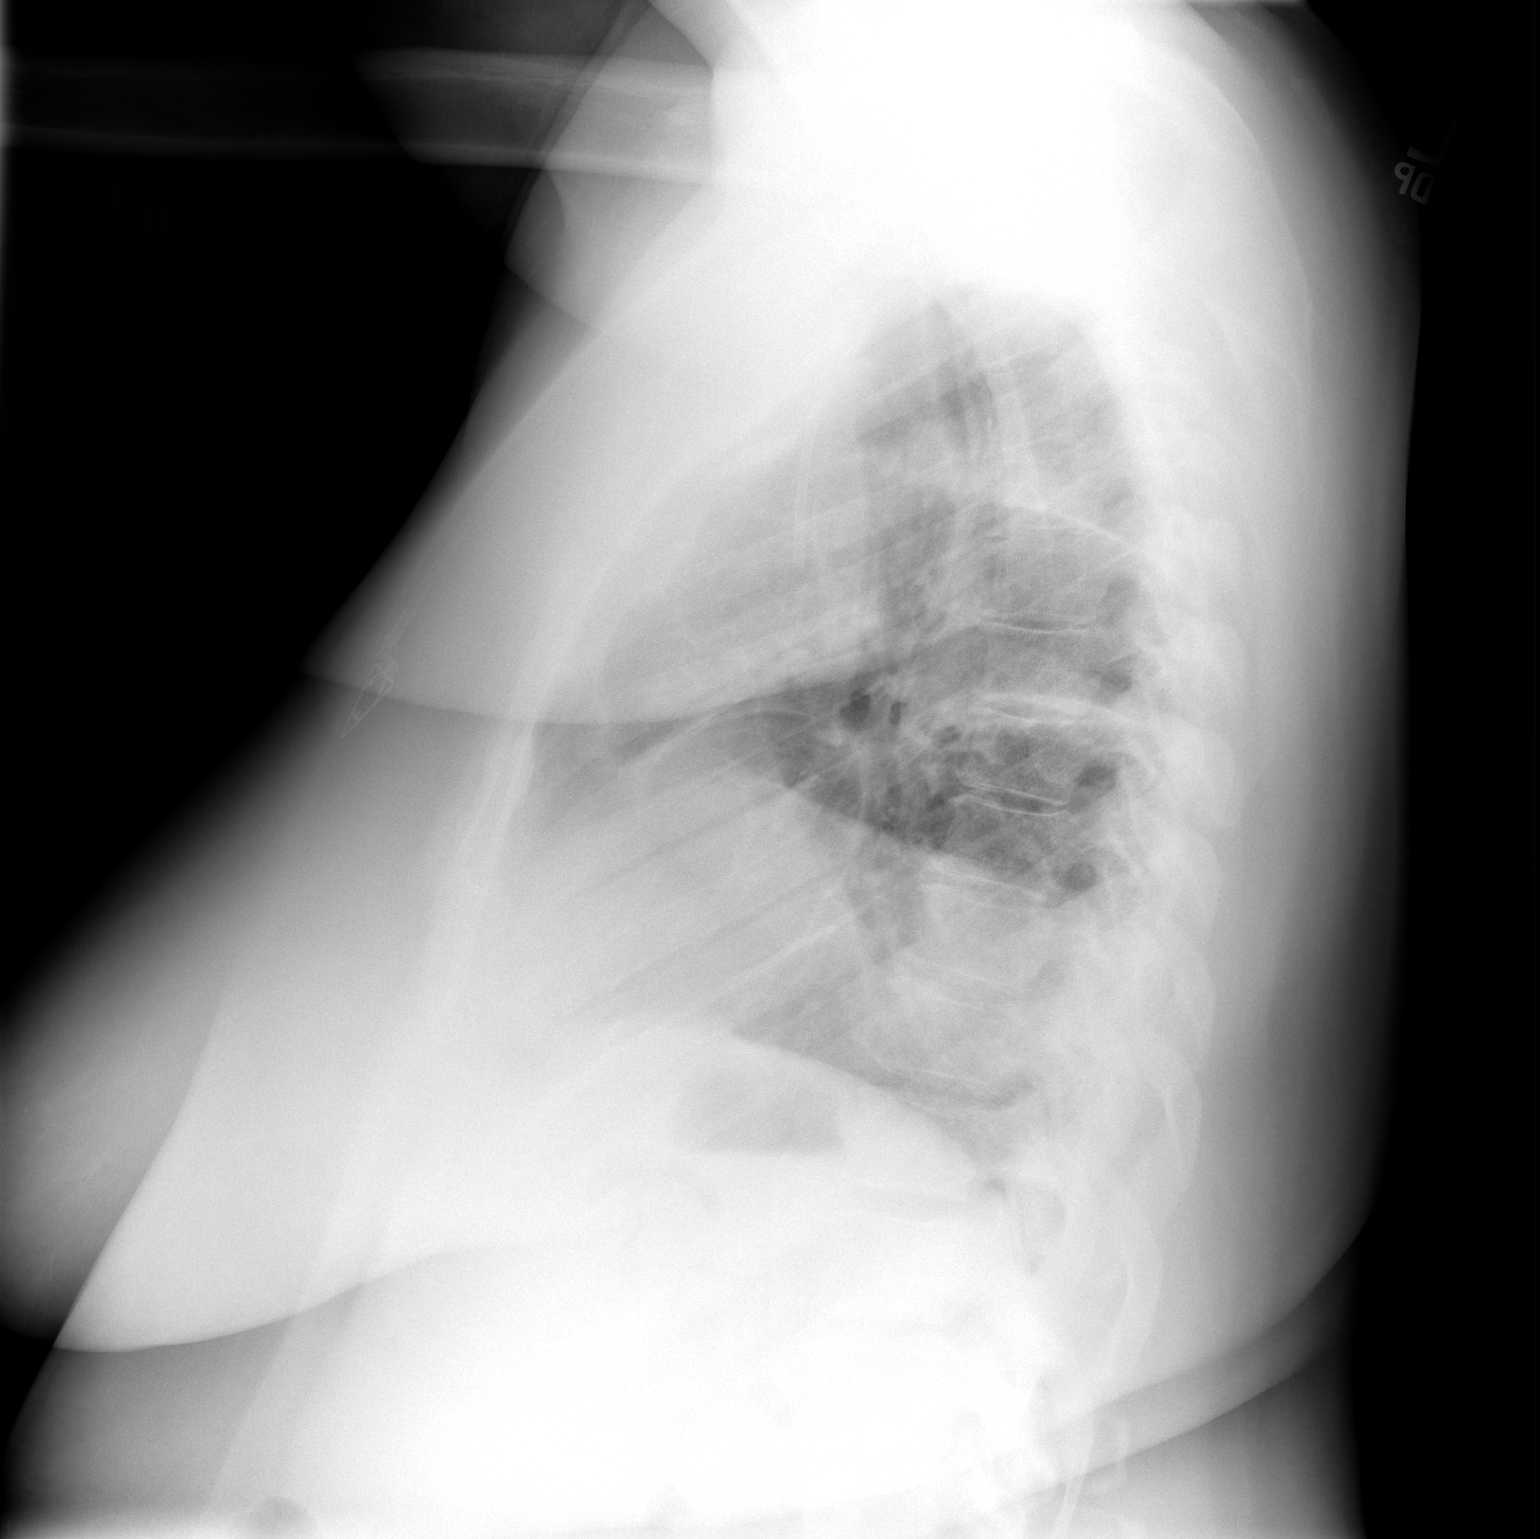

[2 of 2 positions shown; findings below may reference images not displayed]

FINDINGS: There is consolidation in a portion of the posterior segment of the
right upper lobe near the apex. There is scarring and ill-defined
opacity in the right perihilar region. There is volume loss on the
right with small right pleural effusion.

Left lung is clear. Heart size and pulmonary vascularity are within
normal limits. No adenopathy appreciable by radiography. Port-A-Cath
tip is in the superior vena cava. No pneumothorax. No bone lesions.
IMPRESSION: Airspace opacity in the right upper lobe posteriorly near the apex.
Suspect pneumonia. An underlying mass in this area cannot be
excluded. Atelectatic changes noted in the right mid lung
questionable developing pneumonia. Volume loss noted on the right
with apparent slight pleural effusion on the right. Left lung clear.
Stable cardiac silhouette. No evident pneumothorax.

These results will be called to the ordering clinician or
representative by the Radiologist Assistant, and communication
documented in the PACS or zVision Dashboard.

## 2019-06-18 IMAGING — CT CT CHEST WITHOUT CONTRAST
1 series · 14 of 34 positions shown, 18 images · non-contrast
Comparison: CT 05/24/2018.  PET-CT 08/03/2017.

CLINICAL DATA: Non-small-cell lung cancer diagnosed in 6394 post
chemotherapy and radiation therapy.

EXAM:
CT CHEST WITHOUT CONTRAST
TECHNIQUE: Multidetector CT imaging of the chest was performed following the
standard protocol without IV contrast.

[Series 2: chest w/(date) · axial · 0.97mm/px · z∈[-324,-58]mm · 14 of 157 slices shown, 18 images]
[im 12/157  mediastinal]
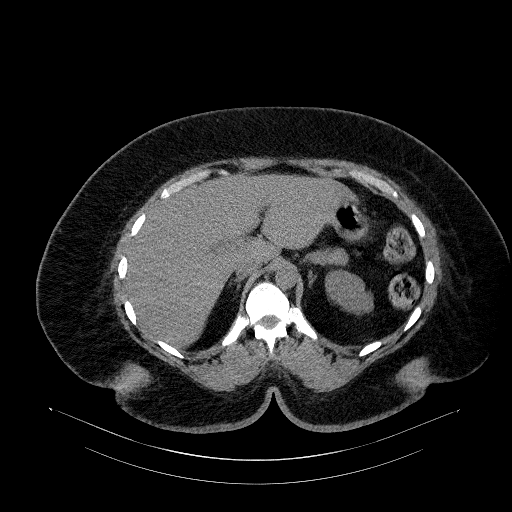
[im 12/157  lung]
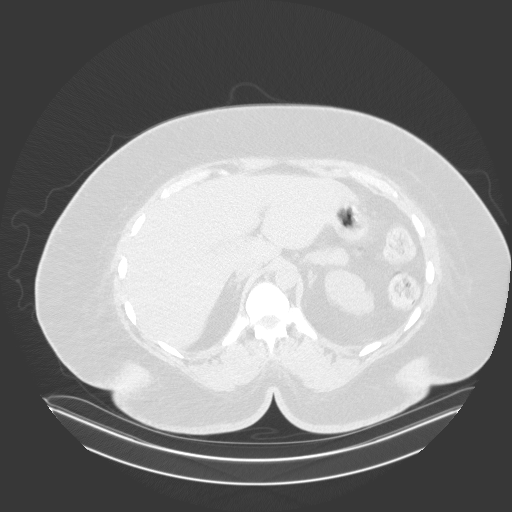
[im 24/157  lung]
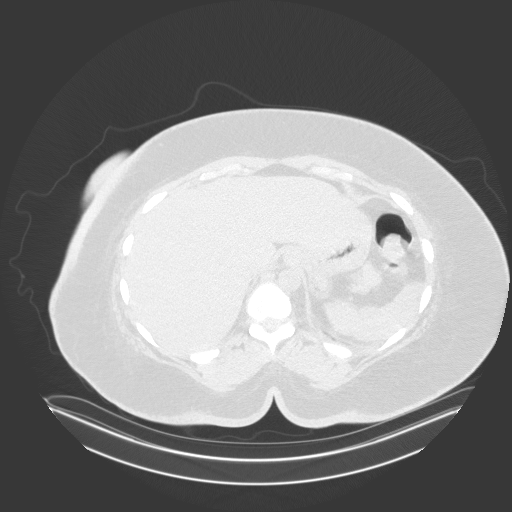
[im 32/157  lung]
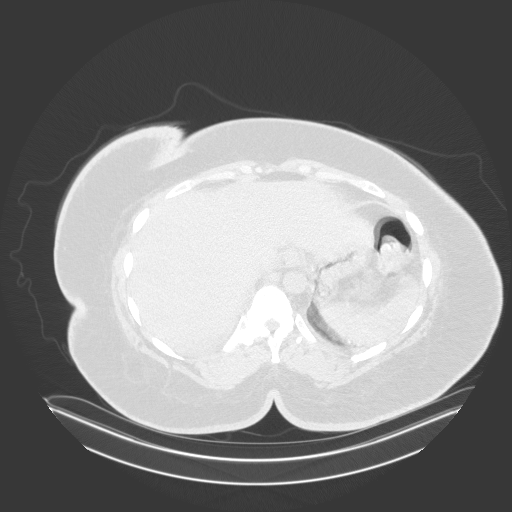
[im 47/157  lung]
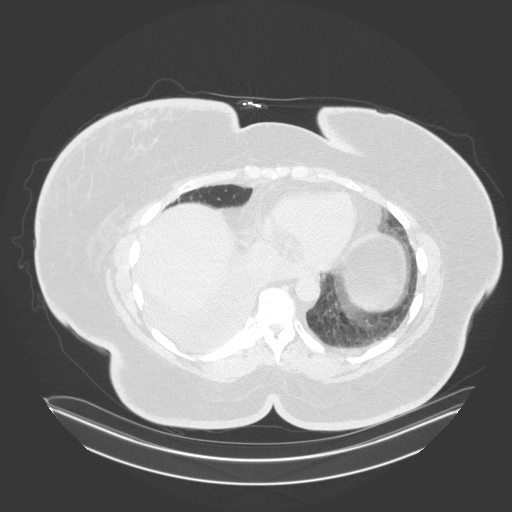
[im 58/157  mediastinal]
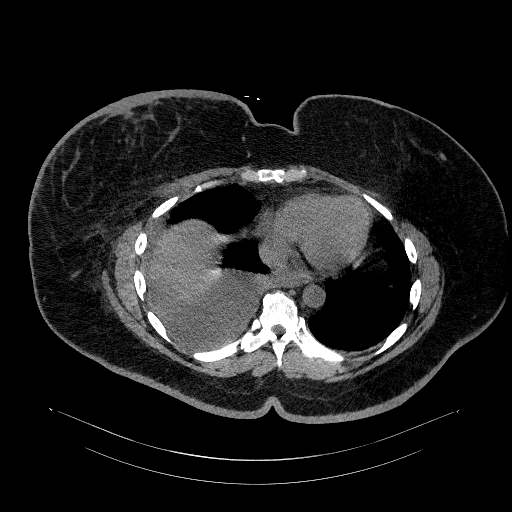
[im 58/157  lung]
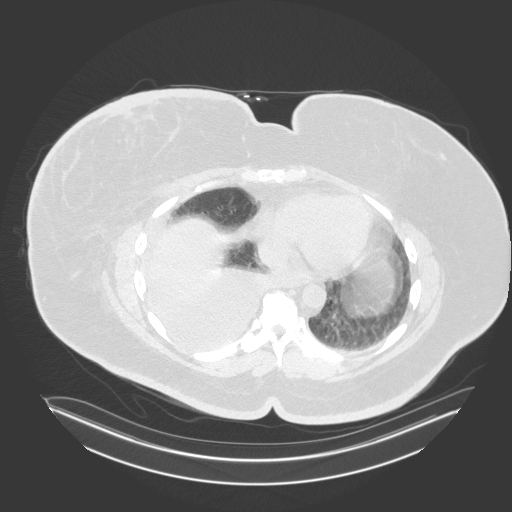
[im 64/157  lung]
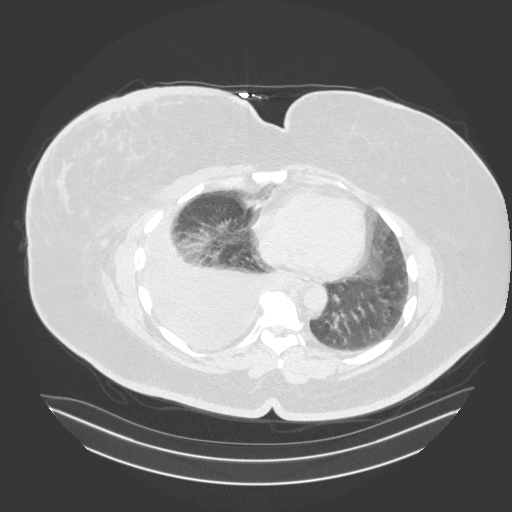
[im 75/157  lung]
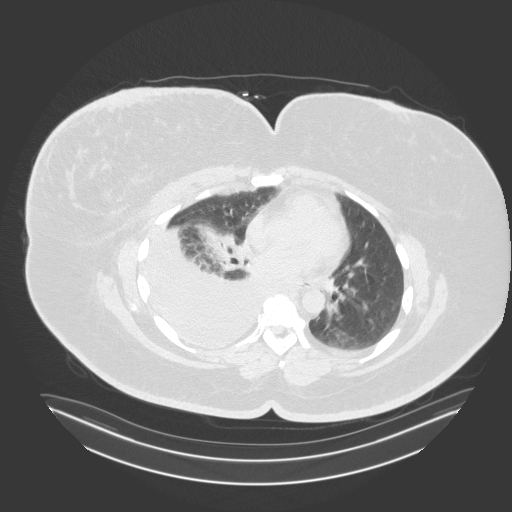
[im 84/157  lung]
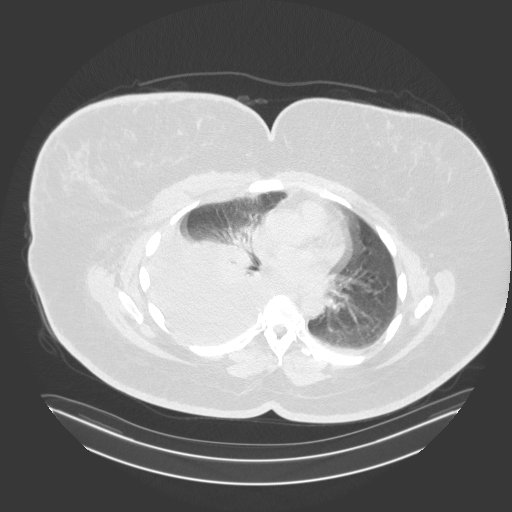
[im 93/157  mediastinal]
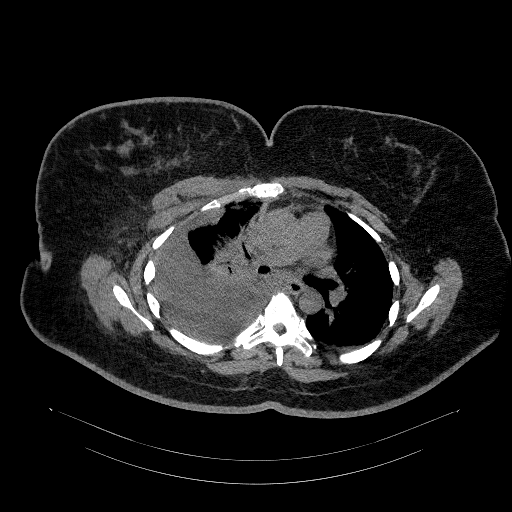
[im 93/157  lung]
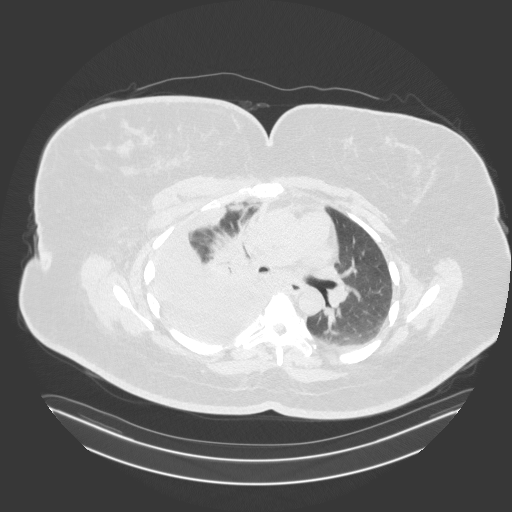
[im 99/157  lung]
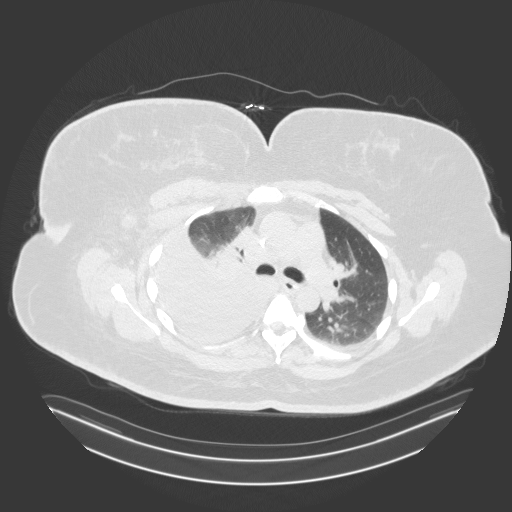
[im 116/157  lung]
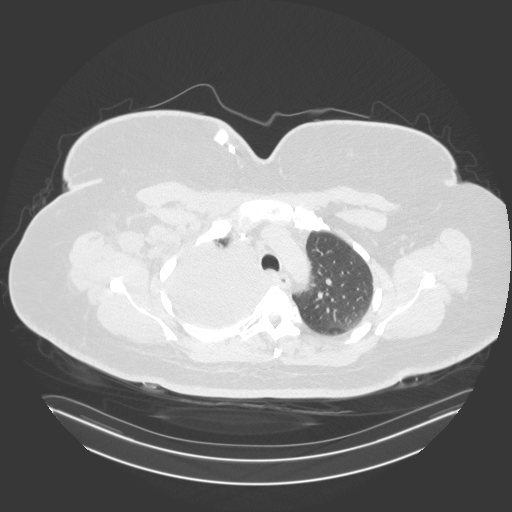
[im 125/157  lung]
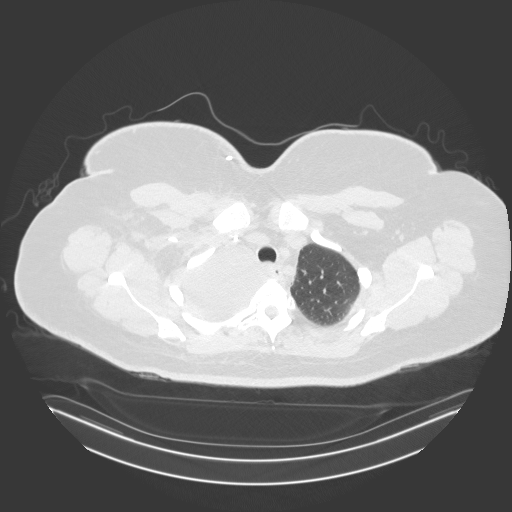
[im 133/157  mediastinal]
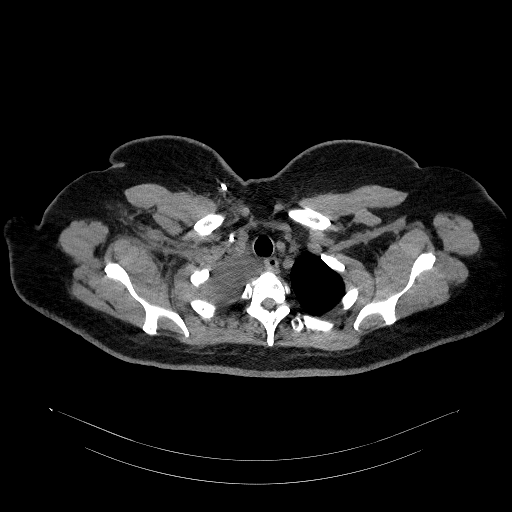
[im 133/157  lung]
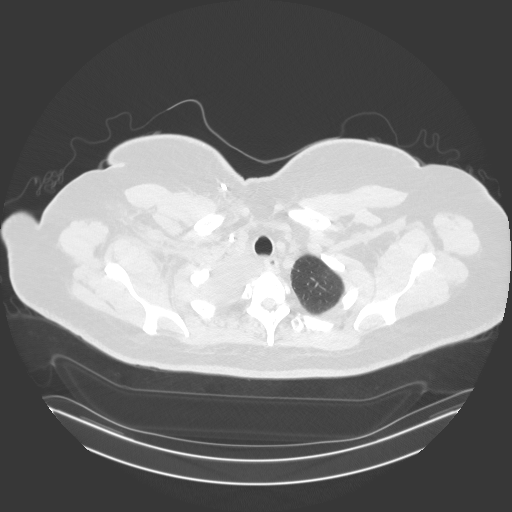
[im 145/157  lung]
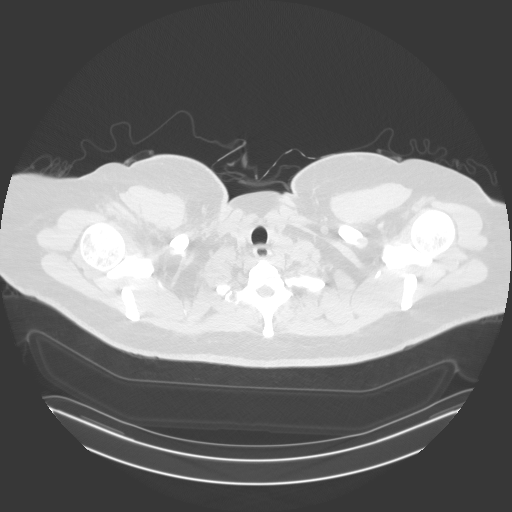

[14 of 34 positions shown; findings below may reference images not displayed]

FINDINGS: Cardiovascular: Right IJ Port-A-Cath extends to the mid SVC, stable.
No acute vascular findings on noncontrast imaging. The heart size is
normal. There is no pericardial effusion.

Mediastinum/Nodes: Numerous enlarged right axillary and subpectoral
lymph nodes are again noted, similar to the most recent study and
measuring up to 2.3 cm short axis on image 48/2. A left
supraclavicular node has enlarged, measuring 2.0 cm on image [DATE]
(previously 1.5 cm). Mediastinal and hilar assessment is limited by
the lack of contrast. Confluent soft tissue in the right hilum and
right paratracheal regions is similar to the previous study,
measuring up to 1.6 cm on image 52/2. Likewise, a 1.3 cm right
retrocrural node on image 132/2 is similar. The thyroid gland,
trachea and esophagus demonstrate no significant findings.

Lungs/Pleura: A moderate size dependent right pleural effusion has
mildly enlarged and results in slight mediastinal shift to the left.
This effusion is suboptimally characterized without contrast,
although has not grossly changed in appearance. Mild atelectasis in
both lungs has mildly progressed. The nodular density previously
noted anteriorly in the right middle lobe is now obscured by
probable fluid or thickening in the minor fissure (sagittal image
98/6). The radiation changes in the right perihilar region are
grossly stable without obvious recurrent mass lesion. No suspicious
left lung nodules.

Upper abdomen: The visualized upper abdomen appears stable without
suspicious findings.

Musculoskeletal/Chest wall: There is no chest wall mass or
suspicious osseous finding. Stable asymmetric skin thickening in the
right breast without focal mass lesion.
IMPRESSION: 1. Progressive left supraclavicular lymphadenopathy. Right axillary,
mediastinal and hilar adenopathy is grossly stable, suboptimally
evaluated without contrast. These findings remain consistent with
metastatic disease.
2. Slight enlargement of moderate-sized right pleural effusion with
extension into the minor fissure. This limits evaluation of the
previously demonstrated right middle lobe nodular density. This
effusion could be malignant. No discretely enlarging pulmonary
nodules identified.
3. Stable skin thickening in the right breast.
4. PET CT follow-up may be helpful to assess for hypermetabolism in
the lymph nodes and right pleural space.

## 2019-08-12 IMAGING — CT CT CHEST WITHOUT CONTRAST
2 of 3 series · 15 of 36 positions shown, 18 images · non-contrast
Comparison: 07/21/2018

CLINICAL DATA: Follow-up lung cancer.

EXAM:
CT CHEST WITHOUT CONTRAST
TECHNIQUE: Multidetector CT imaging of the chest was performed following the
standard protocol without IV contrast.

[Series 2: thorax · axial · 0.78mm/px · z∈[-373,-97]mm · 12 of 163 slices shown, 15 images]
[im 13/163  mediastinal]
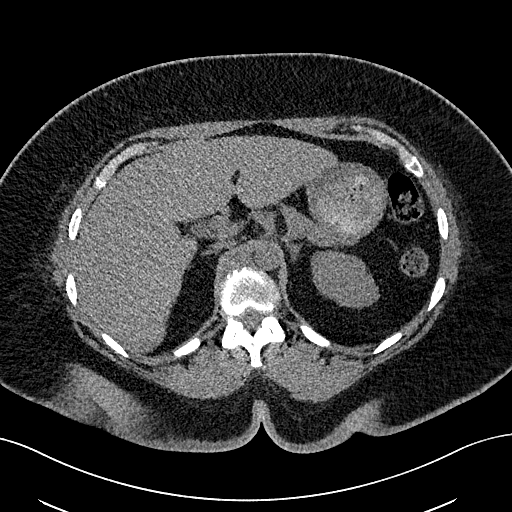
[im 13/163  lung]
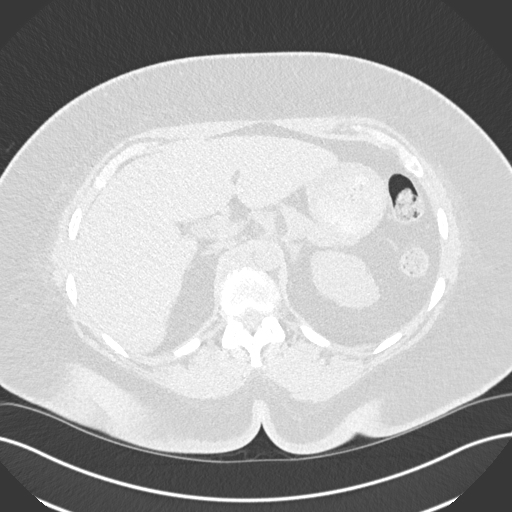
[im 25/163  lung]
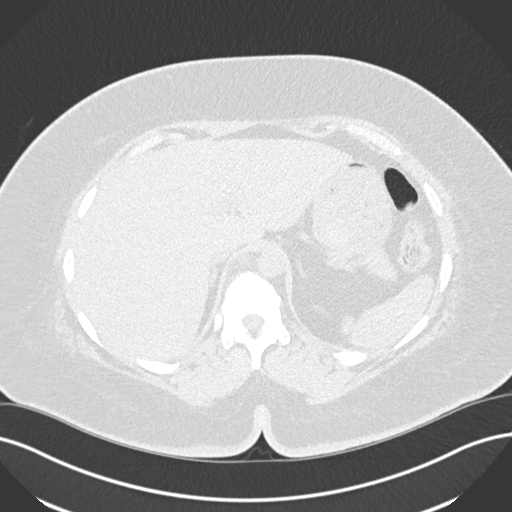
[im 37/163  lung]
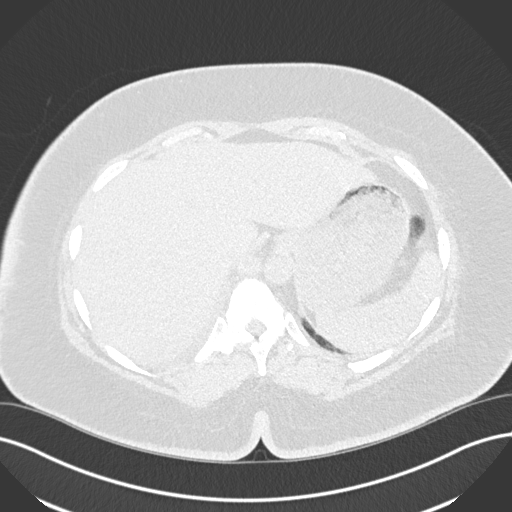
[im 49/163  lung]
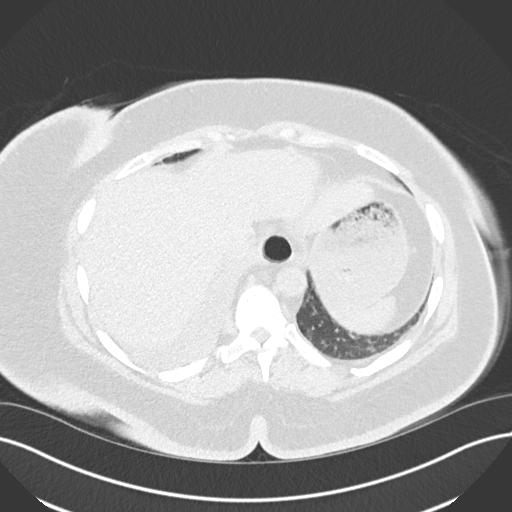
[im 61/163  mediastinal]
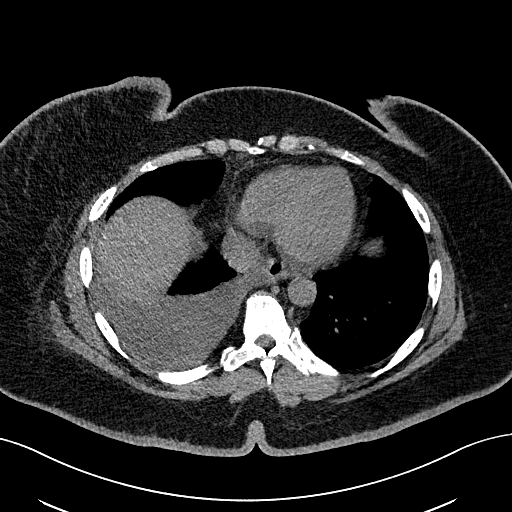
[im 61/163  lung]
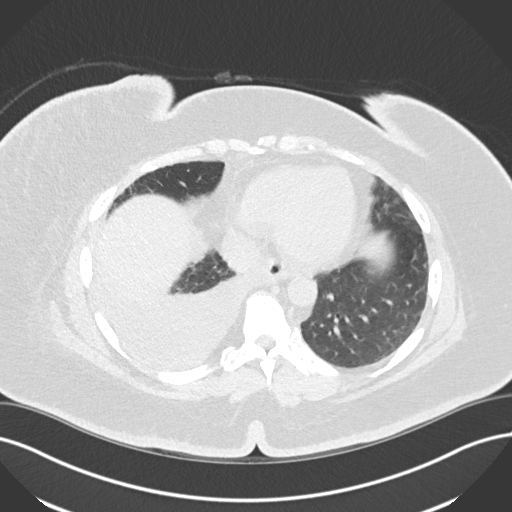
[im 73/163  lung]
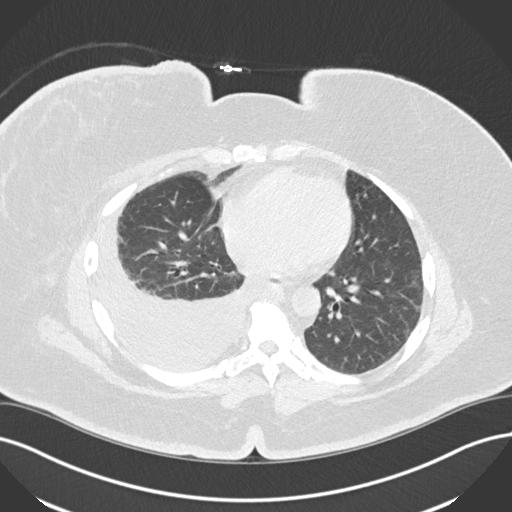
[im 91/163  lung]
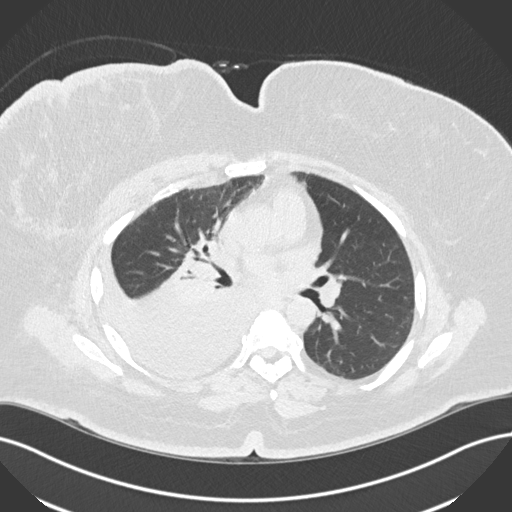
[im 103/163  lung]
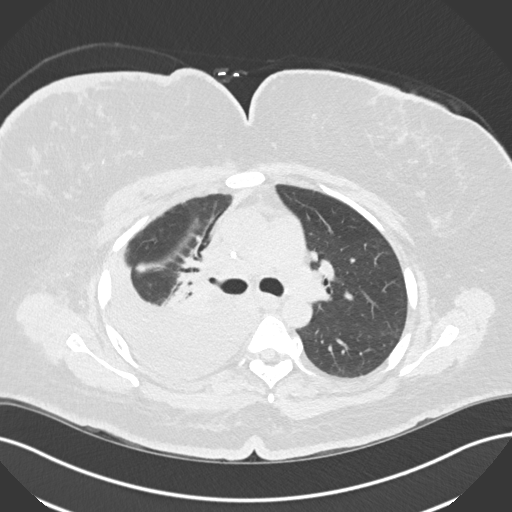
[im 115/163  mediastinal]
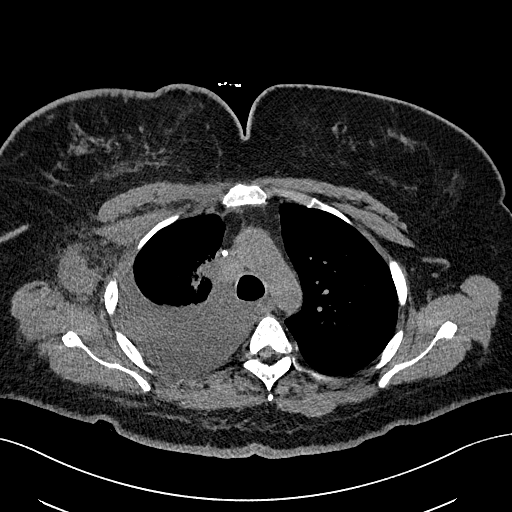
[im 115/163  lung]
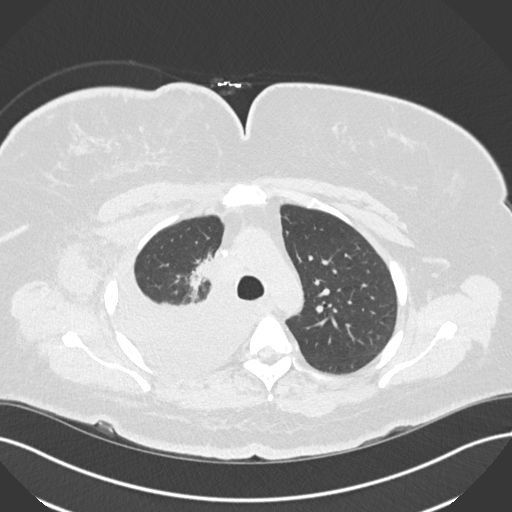
[im 127/163  lung]
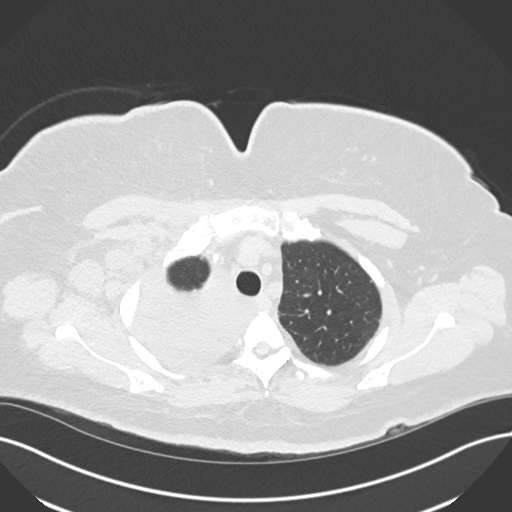
[im 139/163  lung]
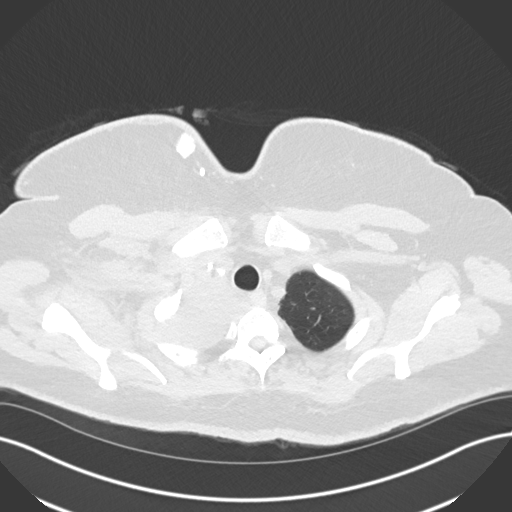
[im 151/163  lung]
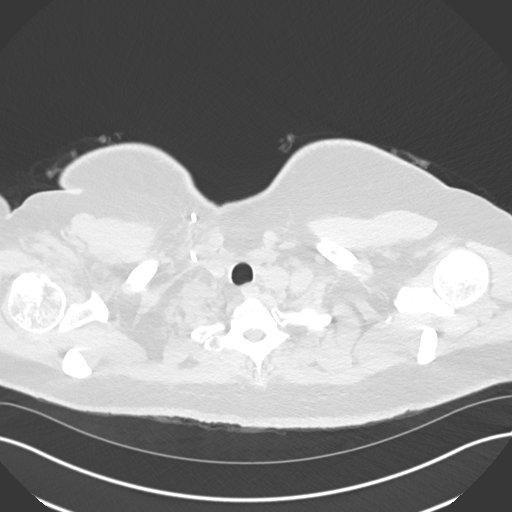

[Series 6: coronal · coronal · 0.63mm/px · 3 of 171 slices shown]
[im 35/171  lung]
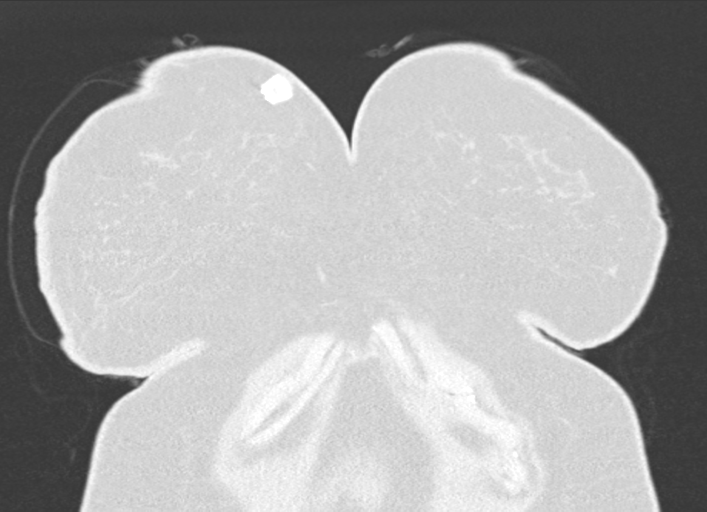
[im 69/171  lung]
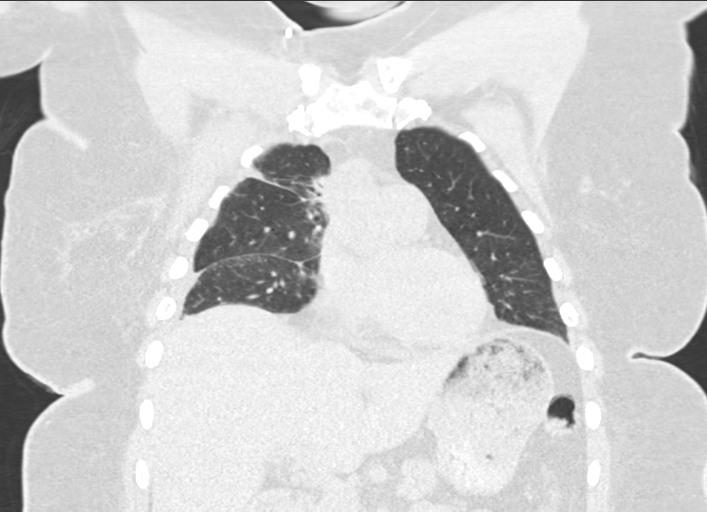
[im 103/171  lung]
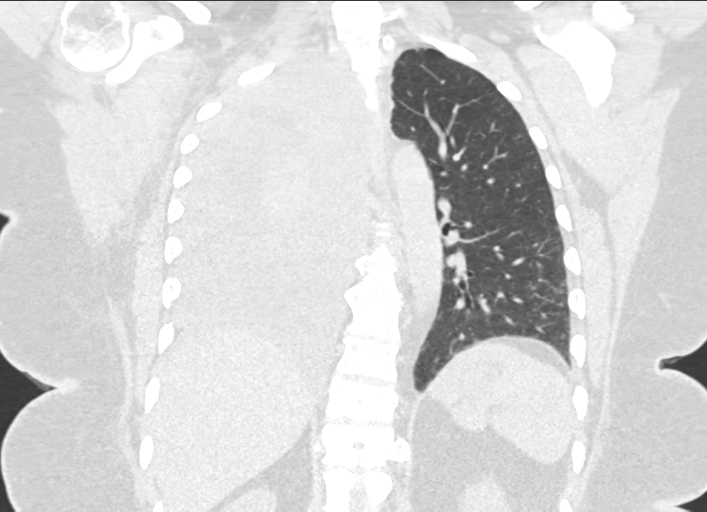

[15 of 36 positions shown; findings below may reference images not displayed]

FINDINGS: Cardiovascular: Heart size appears normal. No pericardial effusion.
Mild aortic atherosclerosis.

Mediastinum/Nodes: Normal appearance of the thyroid gland. The
trachea appears patent and is midline. Normal appearance of the
esophagus.

Index right axillary lymph node measures 2.4 cm, image 48/2.
Previously 2.2 cm. Index right axillary lymph node measures 1.7 cm,
image 38/2. Previously 1.5 cm. The index left supraclavicular lymph
node measures 1.8 cm, image [DATE]. Unchanged. The index right
paratracheal lymph node measures 1.8 cm, image 54/2. Previously
cm.

Lungs/Pleura: Moderate volume right pleural effusion is identified,
stable to slightly decreased in size from previous exam.
Paramediastinal radiation change within a geographic distribution in
the right upper lobe with masslike architectural distortion in the
right hilum is similar to previous study. No suspicious pulmonary
nodules.

Upper Abdomen: No acute abnormality. Multiples large stones within
the gallbladder.

Musculoskeletal: Right breast skin thickening is again noted. No
suspicious bone lesion.
IMPRESSION: 1. Persistent right axillary, mediastinal, and left supraclavicular
adenopathy. Stable to mild increase in size of these lymph nodes
period.
2. Stable to mild decrease in size of moderate size right pleural
effusion.
3. Stable skin thickening of the right breast.

## 2019-08-13 IMAGING — DX CHEST  1 VIEW
1 series · 1 of 1 positions shown · non-contrast
Comparison: Chest CT [DATE]

CLINICAL DATA: S/P right thoracentesis that yielded3 liter of
fluid.

EXAM:
CHEST  1 VIEW

[chest ap]
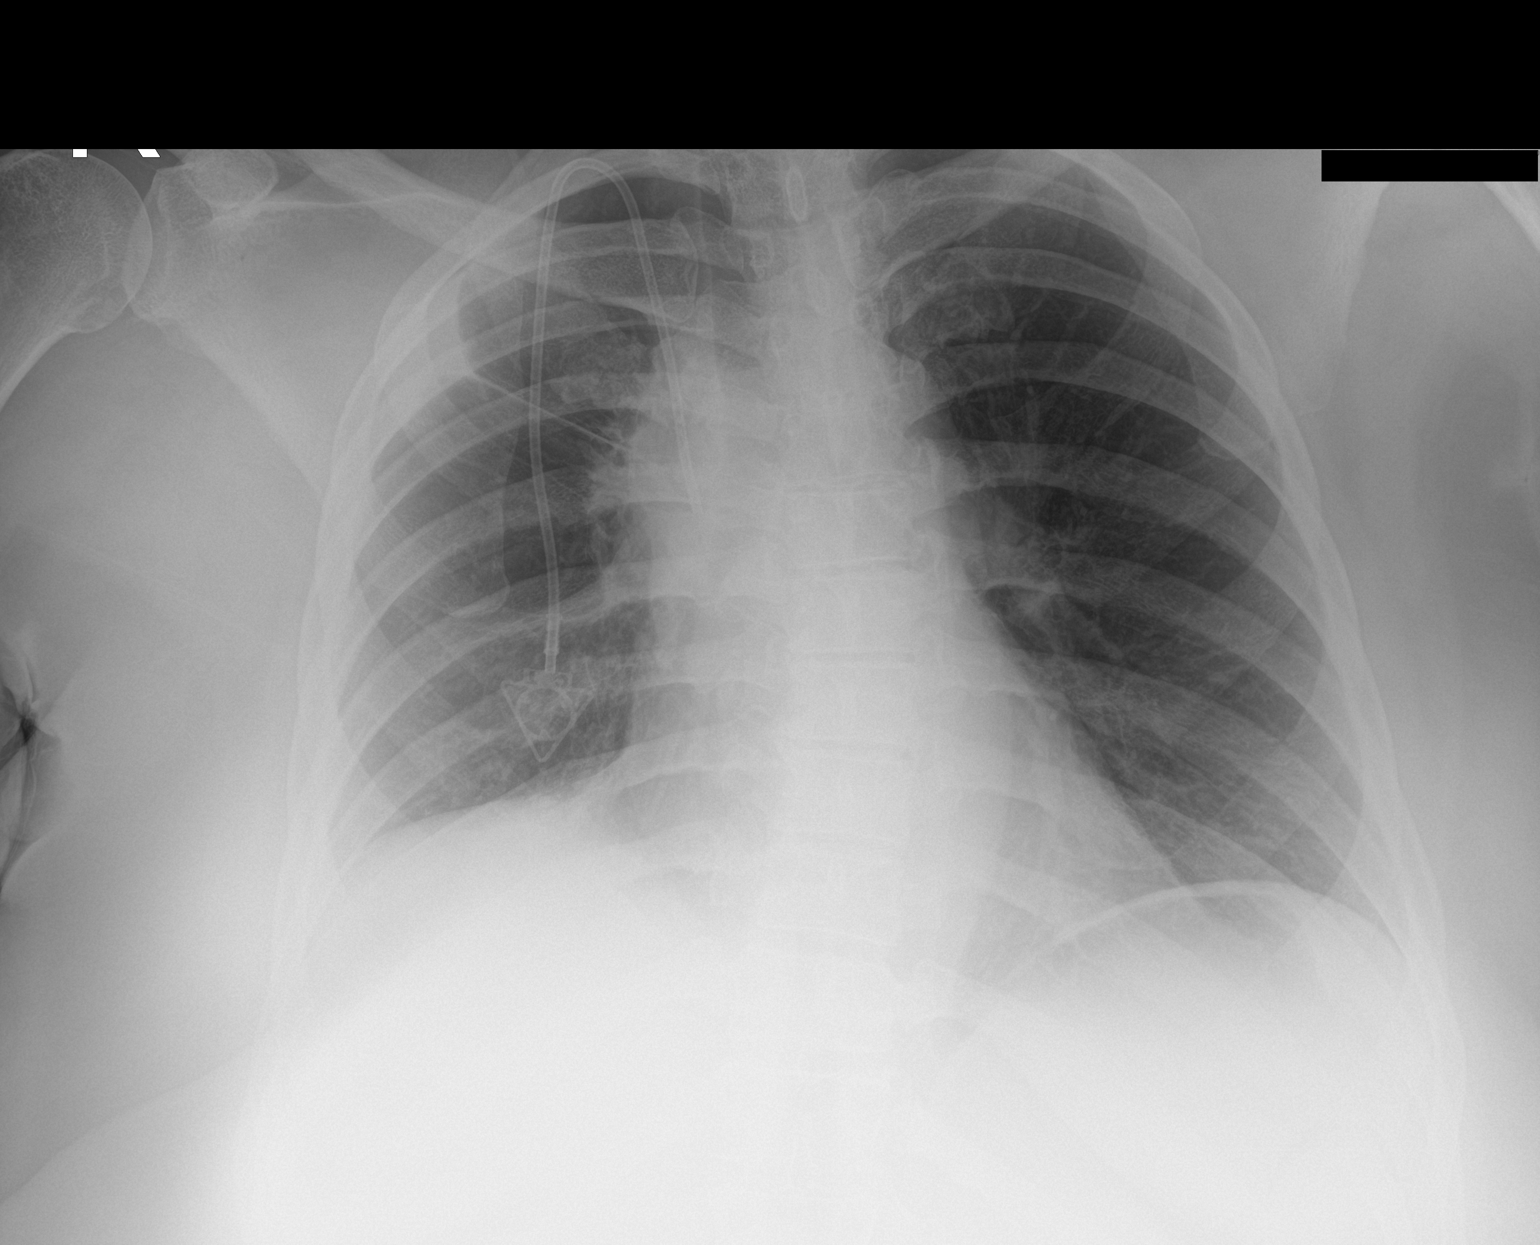

[1 of 1 positions shown; findings below may reference images not displayed]

FINDINGS: Reduction in volume of lower RIGHT pleural effusion. No
pneumothorax. RIGHT perihilar density noted. LEFT lung clear
IMPRESSION: No pneumothorax following thoracentesis.  Reduction in pleural fluid

## 2019-08-13 IMAGING — US US THORACENTESIS ASP PLEURAL SPACE W/IMG GUIDE
1 series · 4 of 4 positions shown · non-contrast
Comparison: none

INDICATION: Patient with history of metastatic lung cancer, recurrent right
pleural effusion, dyspnea. Request made for diagnostic and
therapeutic right thoracentesis.

[Series 1: us thoracentesis asp pleural space w/img guide · 4 of 4 slices shown]
[im 1/4]
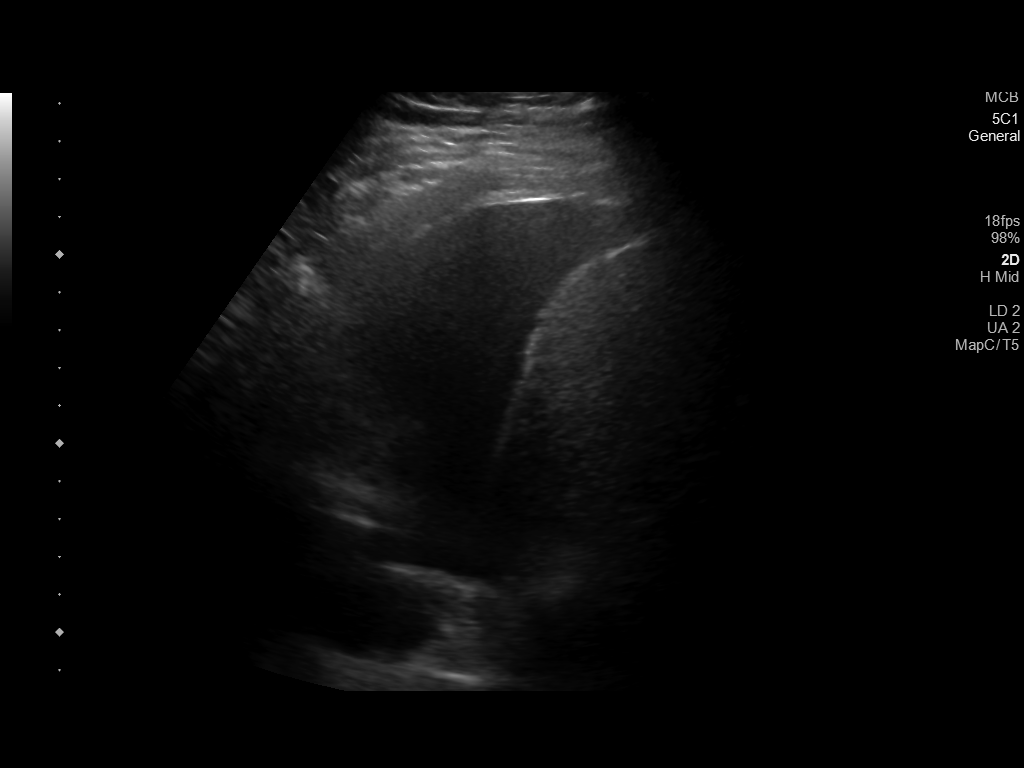
[im 2/4]
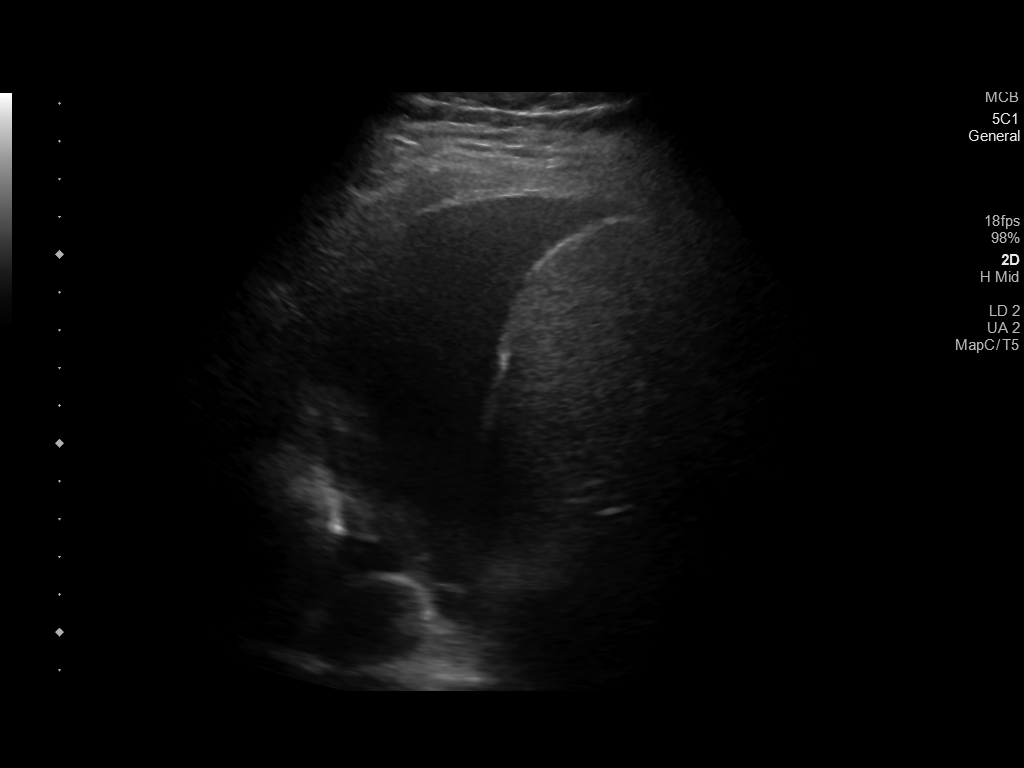
[im 3/4]
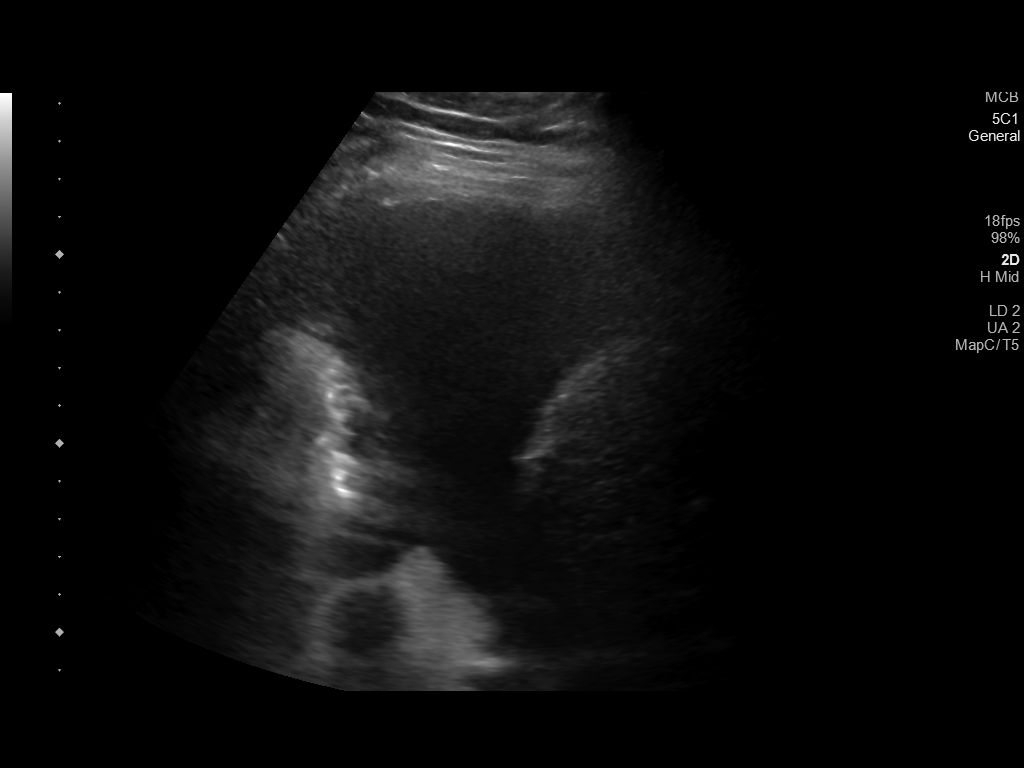
[im 4/4]
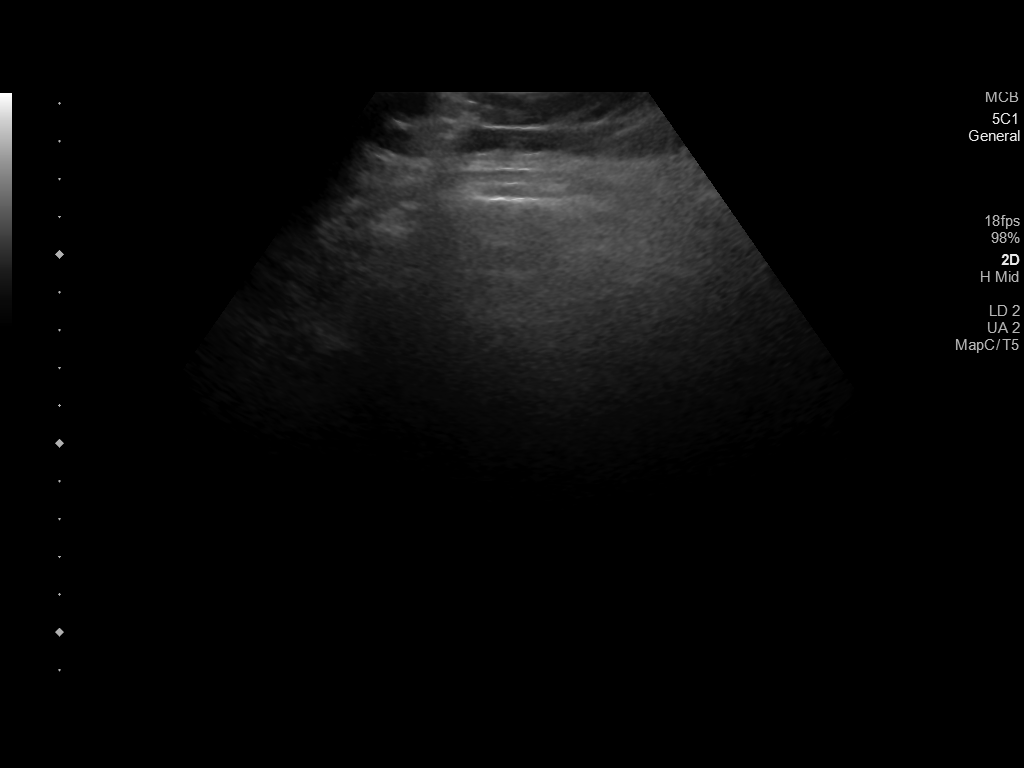

[4 of 4 positions shown; findings below may reference images not displayed]

EXAM:
ULTRASOUND GUIDED DIAGNOSTIC AND THERAPEUTIC RIGHT THORACENTESIS

MEDICATIONS:
None

COMPLICATIONS:
None immediate.

PROCEDURE:
An ultrasound guided thoracentesis was thoroughly discussed with the
patient and questions answered. The benefits, risks, alternatives
and complications were also discussed. The patient understands and
wishes to proceed with the procedure. Written consent was obtained.

Ultrasound was performed to localize and mark an adequate pocket of
fluid in the right chest. The area was then prepped and draped in
the normal sterile fashion. 1% Lidocaine was used for local
anesthesia. Under ultrasound guidance a 6 Fr Safe-T-Centesis
catheter was introduced. Thoracentesis was performed. The catheter
was removed and a dressing applied.
FINDINGS: A total of approximately 1 liter of hazy, amber fluid was removed.
Samples were sent to the laboratory as requested by the clinical
team.
IMPRESSION: Successful ultrasound guided diagnostic and therapeutic right
thoracentesis yielding 1 liter of pleural fluid.

## 2019-10-14 IMAGING — DX PORTABLE CHEST - 1 VIEW
1 series · 1 of 1 positions shown · non-contrast
Comparison: Radiographs and CT earlier this day.

CLINICAL DATA: Chest pain, onset yesterday. Lung cancer with active
chemotherapy. Thoracentesis earlier today.

EXAM:
PORTABLE CHEST 1 VIEW

[chest ap]
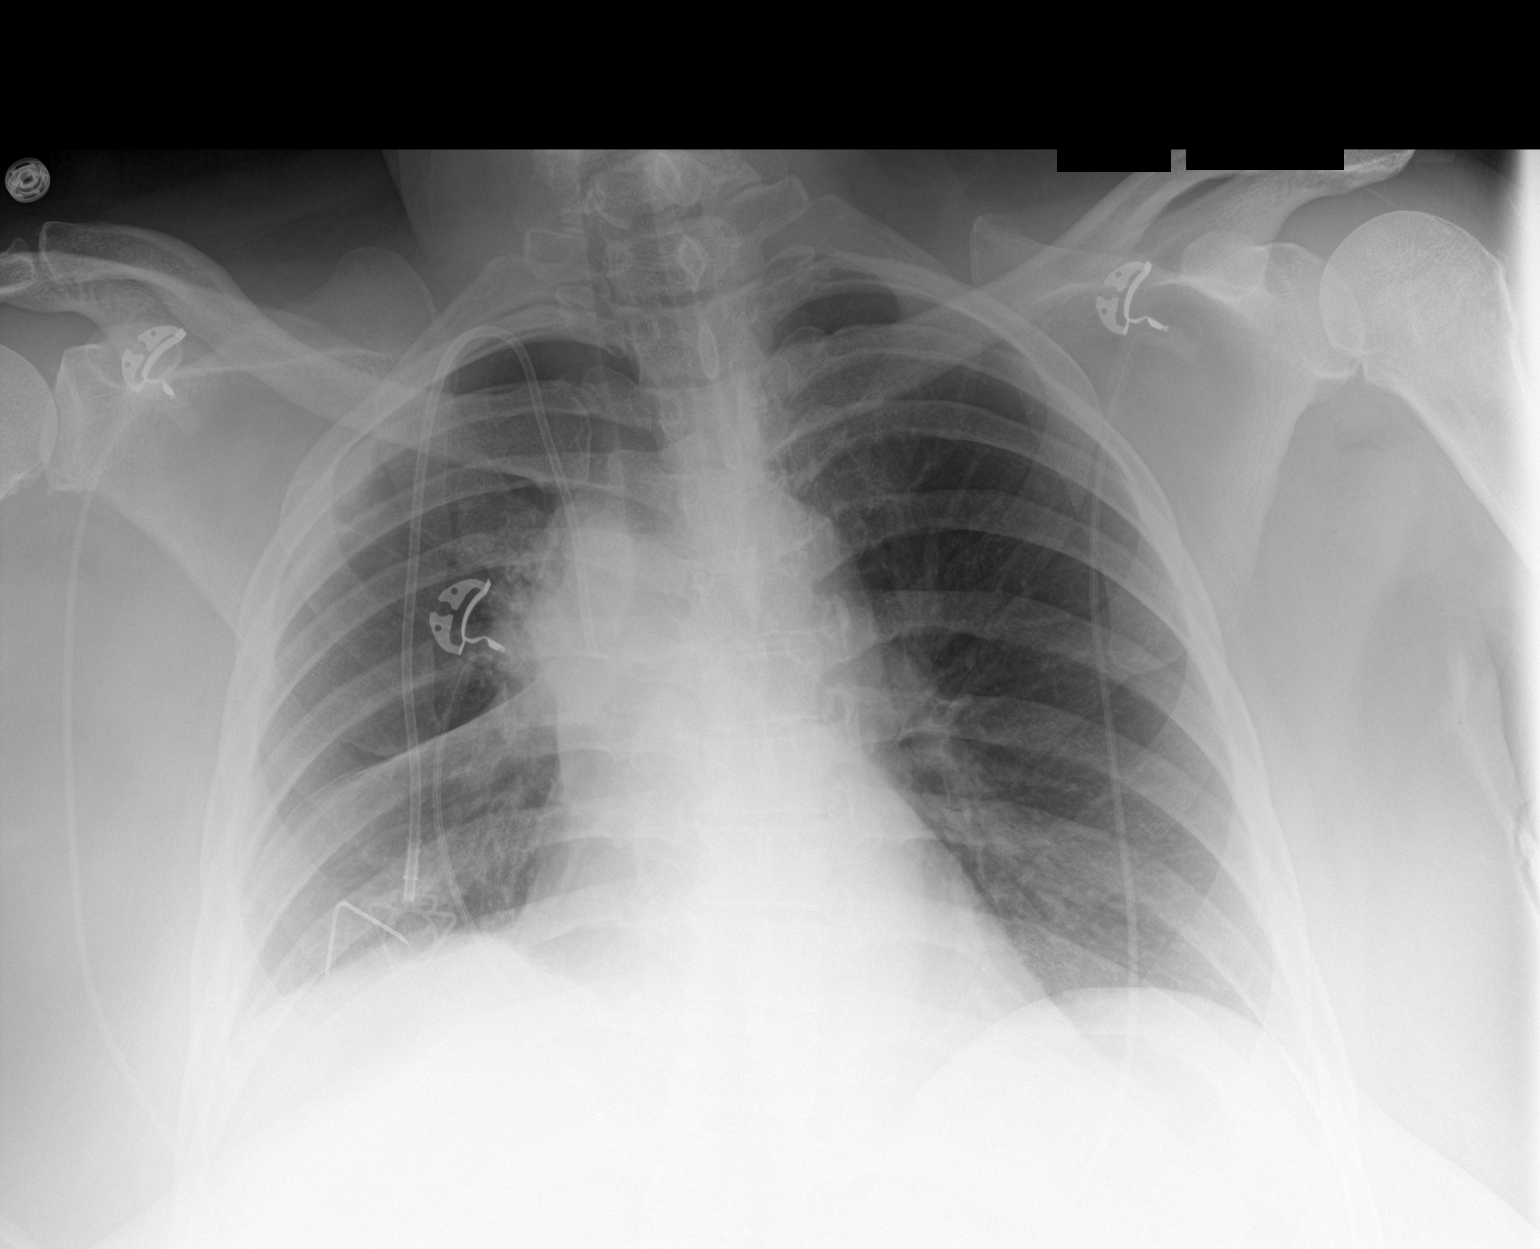

[1 of 1 positions shown; findings below may reference images not displayed]

FINDINGS: Right chest port remains in place. Unchanged volume loss in the
right hemithorax. No pneumothorax. Right pleural fluid better
demonstrated on prior CT. Unchanged heart size and mediastinal
contours with right hilar prominence, stable. Left lung is clear.
IMPRESSION: No pneumothorax following right thoracentesis. Unchanged right
hemithorax volume loss and hilar density.

## 2019-10-14 IMAGING — US RIGHT LOWER EXTREMITY SOFT TISSUE ULTRASOUND LIMITED
1 series · 8 of 8 positions shown · non-contrast
Comparison: None.

CLINICAL DATA: Open wound of the right inner thigh. Evaluate for
deep abscess.

EXAM:
ULTRASOUND right LOWER EXTREMITY LIMITED
TECHNIQUE: Ultrasound examination of the lower extremity soft tissues was
performed in the area of clinical concern.

[Series 1: right lower extremity soft tissue ultrasound limit · 8 acquisitions, 8 frames shown]
[im 1/8]
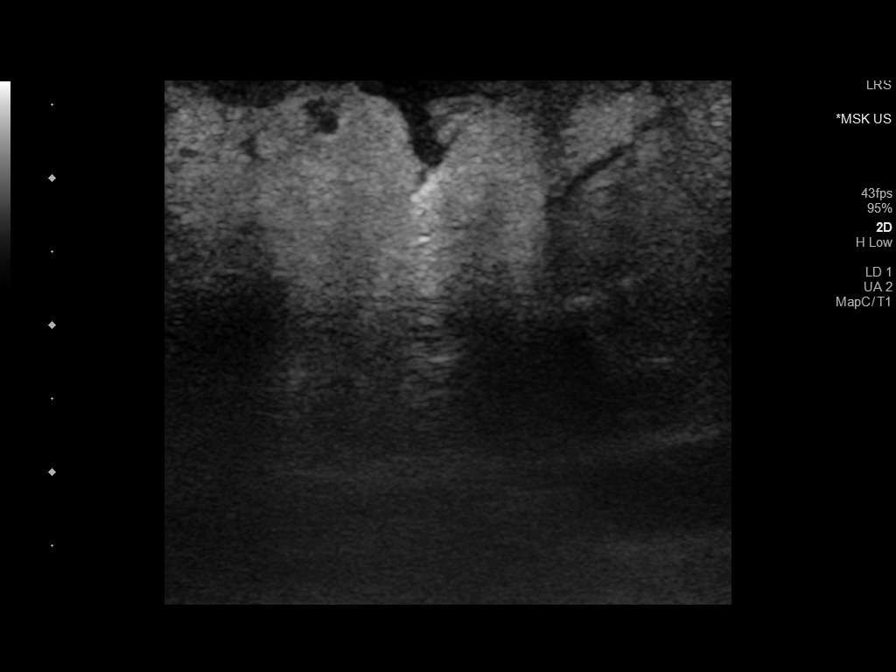
[im 2/8]
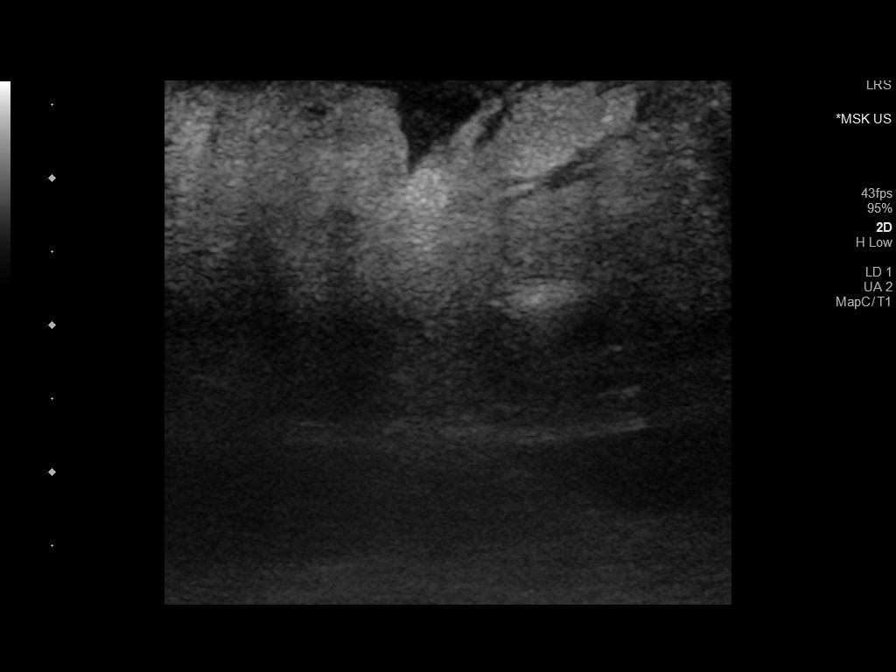
[im 3/8]
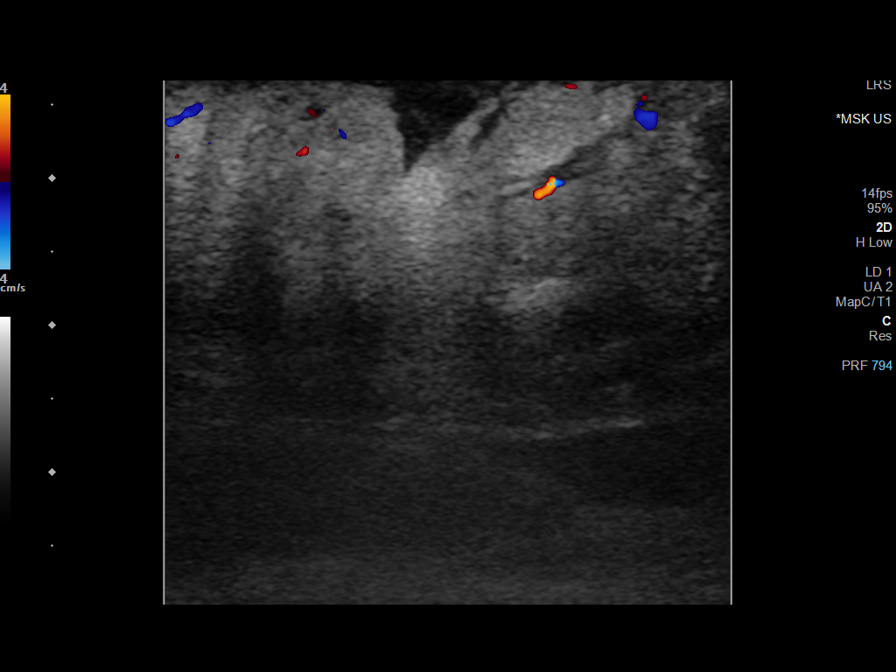
[im 4/8]
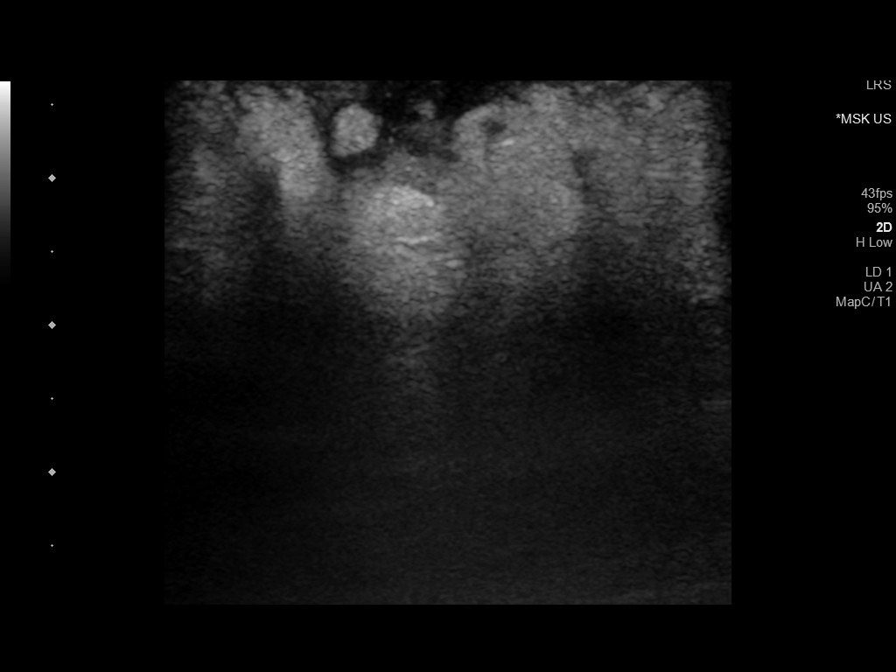
[im 5/8]
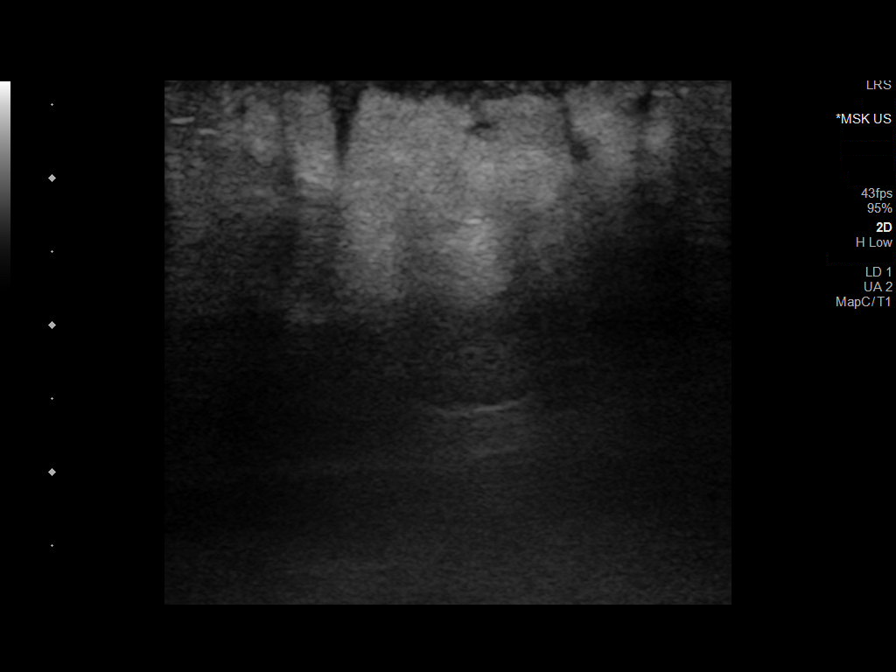
[im 6/8]
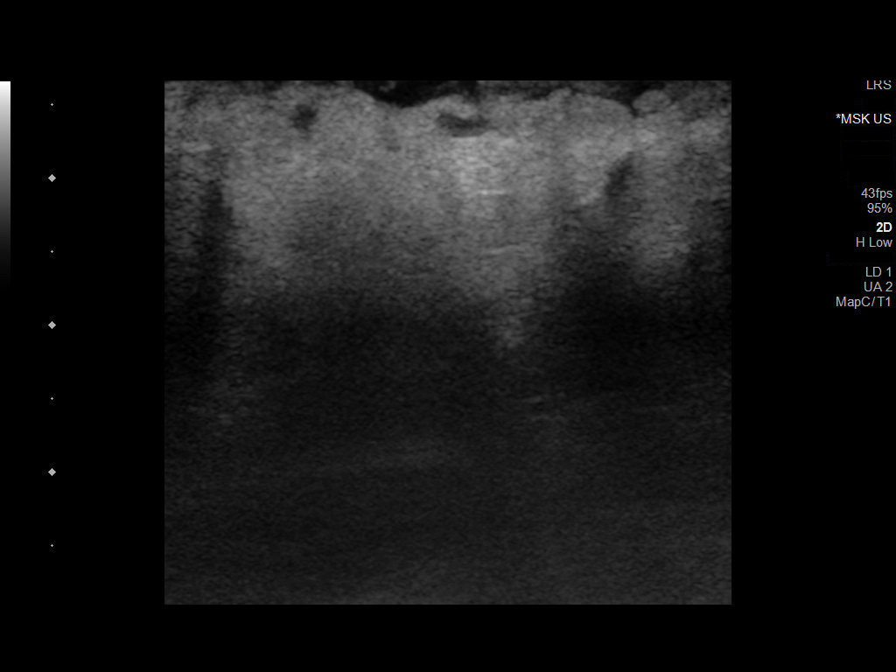
[im 7/8]
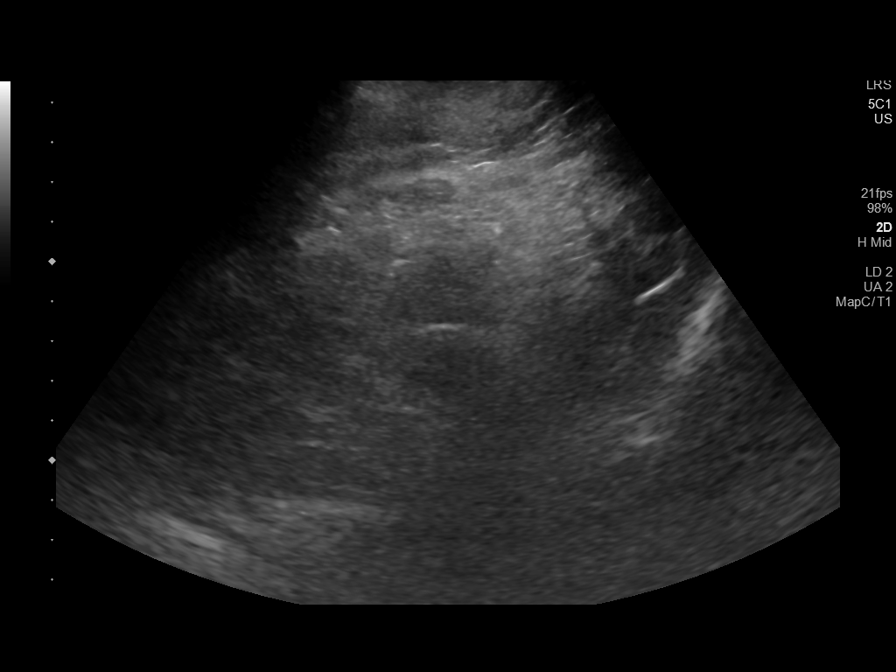
[im 8/8]
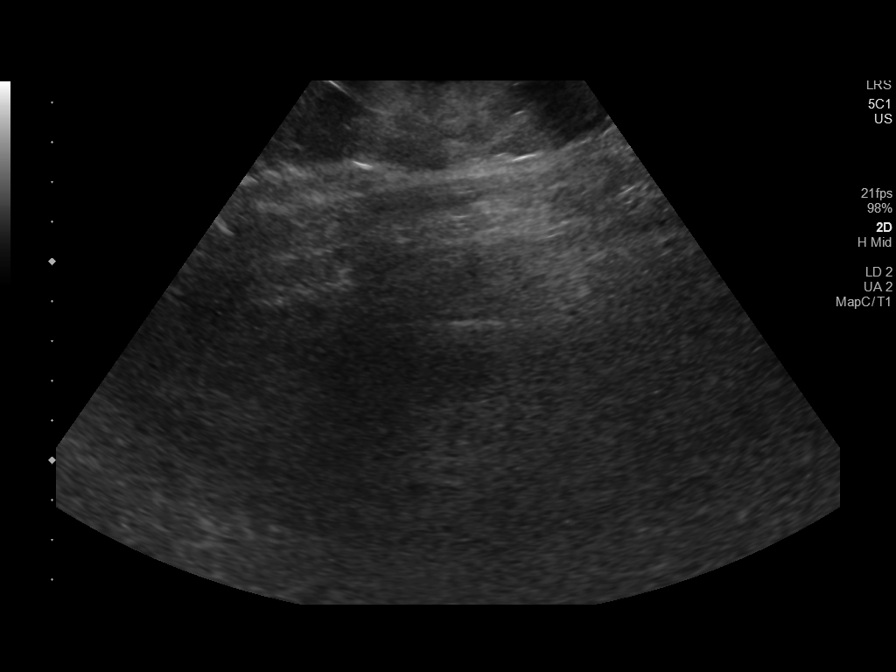

[8 of 8 positions shown; findings below may reference images not displayed]

FINDINGS: Scanning in the region of concern does not show any evidence of a
deep fluid collection. Mild nonspecific edema of the subcutaneous
tissues.
IMPRESSION: No visible abscess.

## 2019-10-14 IMAGING — US US THORACENTESIS ASP PLEURAL SPACE W/IMG GUIDE
1 series · 4 of 4 positions shown · non-contrast
Comparison: none

INDICATION: History of metastatic lung cancer, recurrent right pleural effusion,
dyspnea. Request for diagnostic and therapeutic right thoracentesis.

[Series 1: us thoracentesis asp pleural space w/img guide · 4 of 4 slices shown]
[im 1/4]
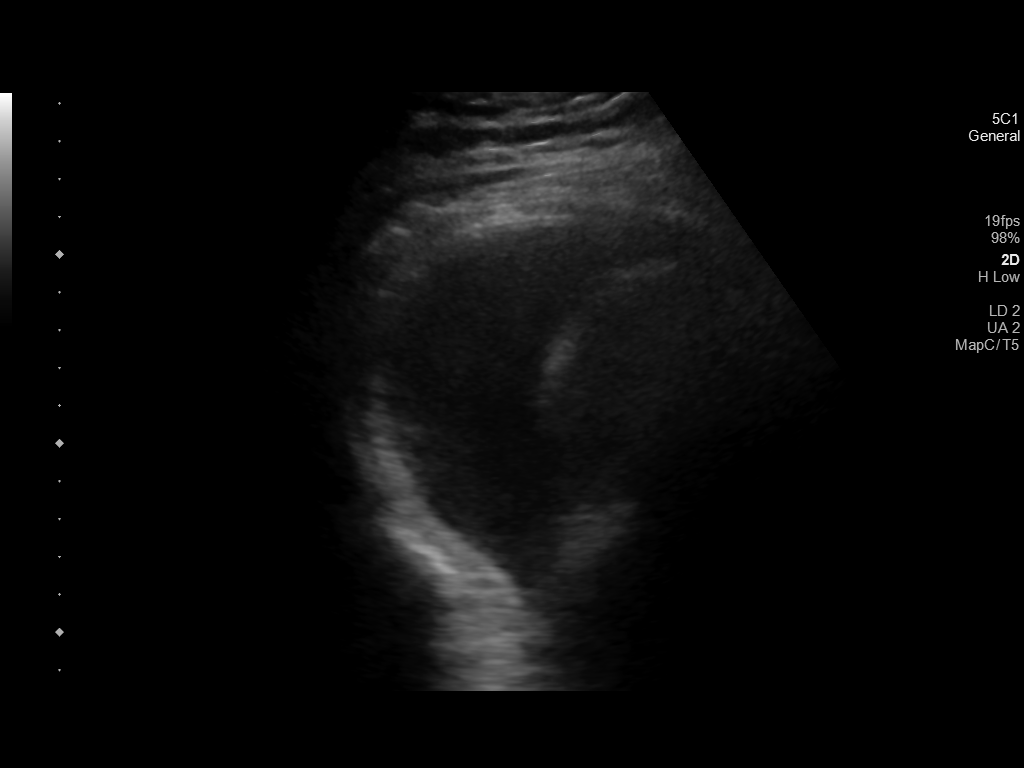
[im 2/4]
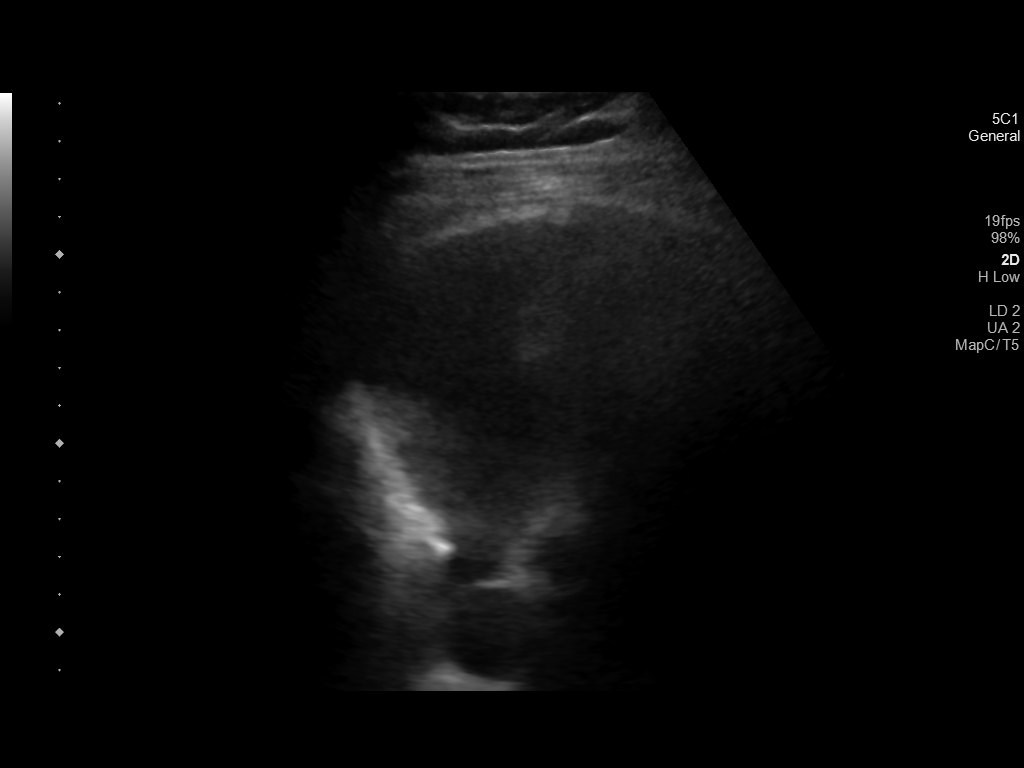
[im 3/4]
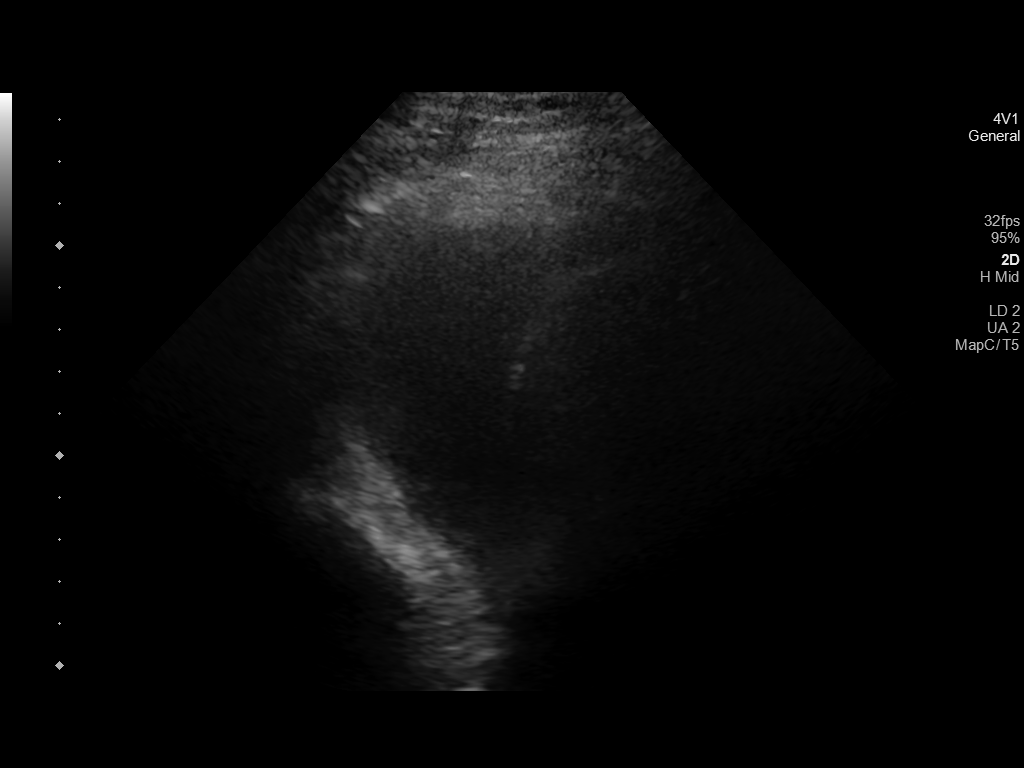
[im 4/4]
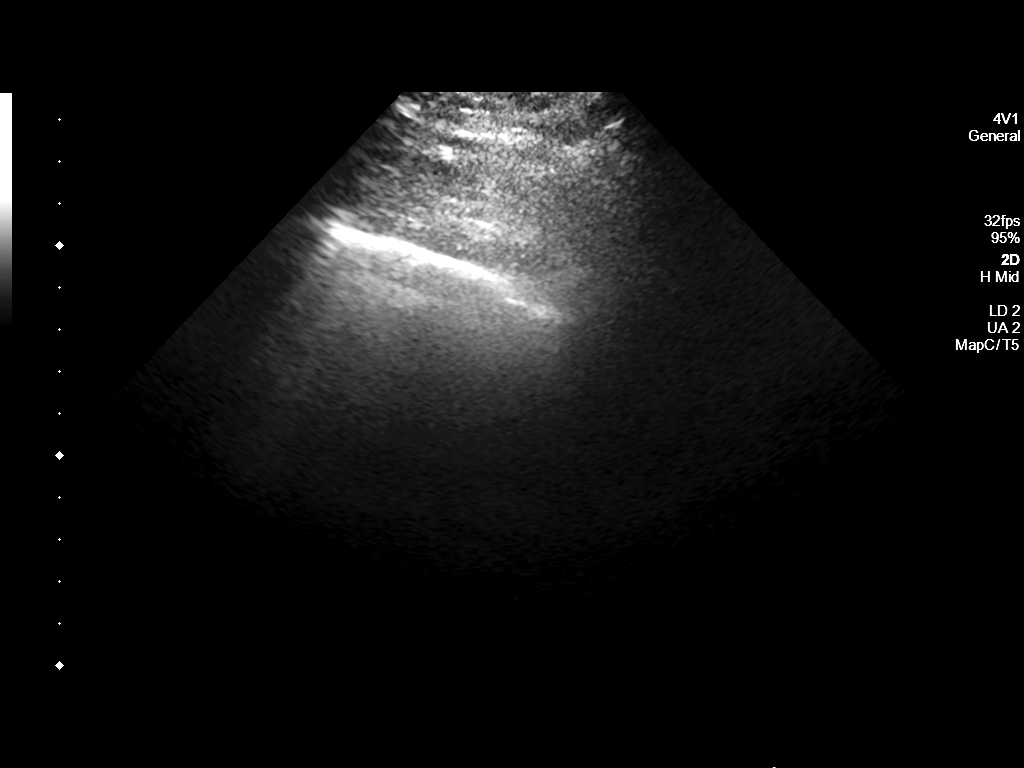

[4 of 4 positions shown; findings below may reference images not displayed]

EXAM:
ULTRASOUND GUIDED RIGHT THORACENTESIS

MEDICATIONS:
1% lidocaine 10 mL

COMPLICATIONS:
None immediate.

PROCEDURE:
An ultrasound guided thoracentesis was thoroughly discussed with the
patient and questions answered. The benefits, risks, alternatives
and complications were also discussed. The patient understands and
wishes to proceed with the procedure. Written consent was obtained.

Ultrasound was performed to localize and mark an adequate pocket of
fluid in the right chest. The area was then prepped and draped in
the normal sterile fashion. 1% Lidocaine was used for local
anesthesia. Under ultrasound guidance a 6 Fr Safe-T-Centesis
catheter was introduced. Thoracentesis was performed. The catheter
was removed and a dressing applied.
FINDINGS: A total of approximately 450 mL of hazy yellow fluid was removed.
Samples were sent to the laboratory as requested by the clinical
team.
IMPRESSION: Successful ultrasound guided right thoracentesis yielding 450 mL of
pleural fluid.

No pneumothorax on post-procedure chest x-ray.

## 2019-10-14 IMAGING — DX CHEST  1 VIEW
1 series · 1 of 1 positions shown · non-contrast
Comparison: 09/15/2018 and chest CT 11/16/2018.

CLINICAL DATA: Post right thoracentesis. History of stage IV lung
cancer.

EXAM:
CHEST  1 VIEW

[chest ap]
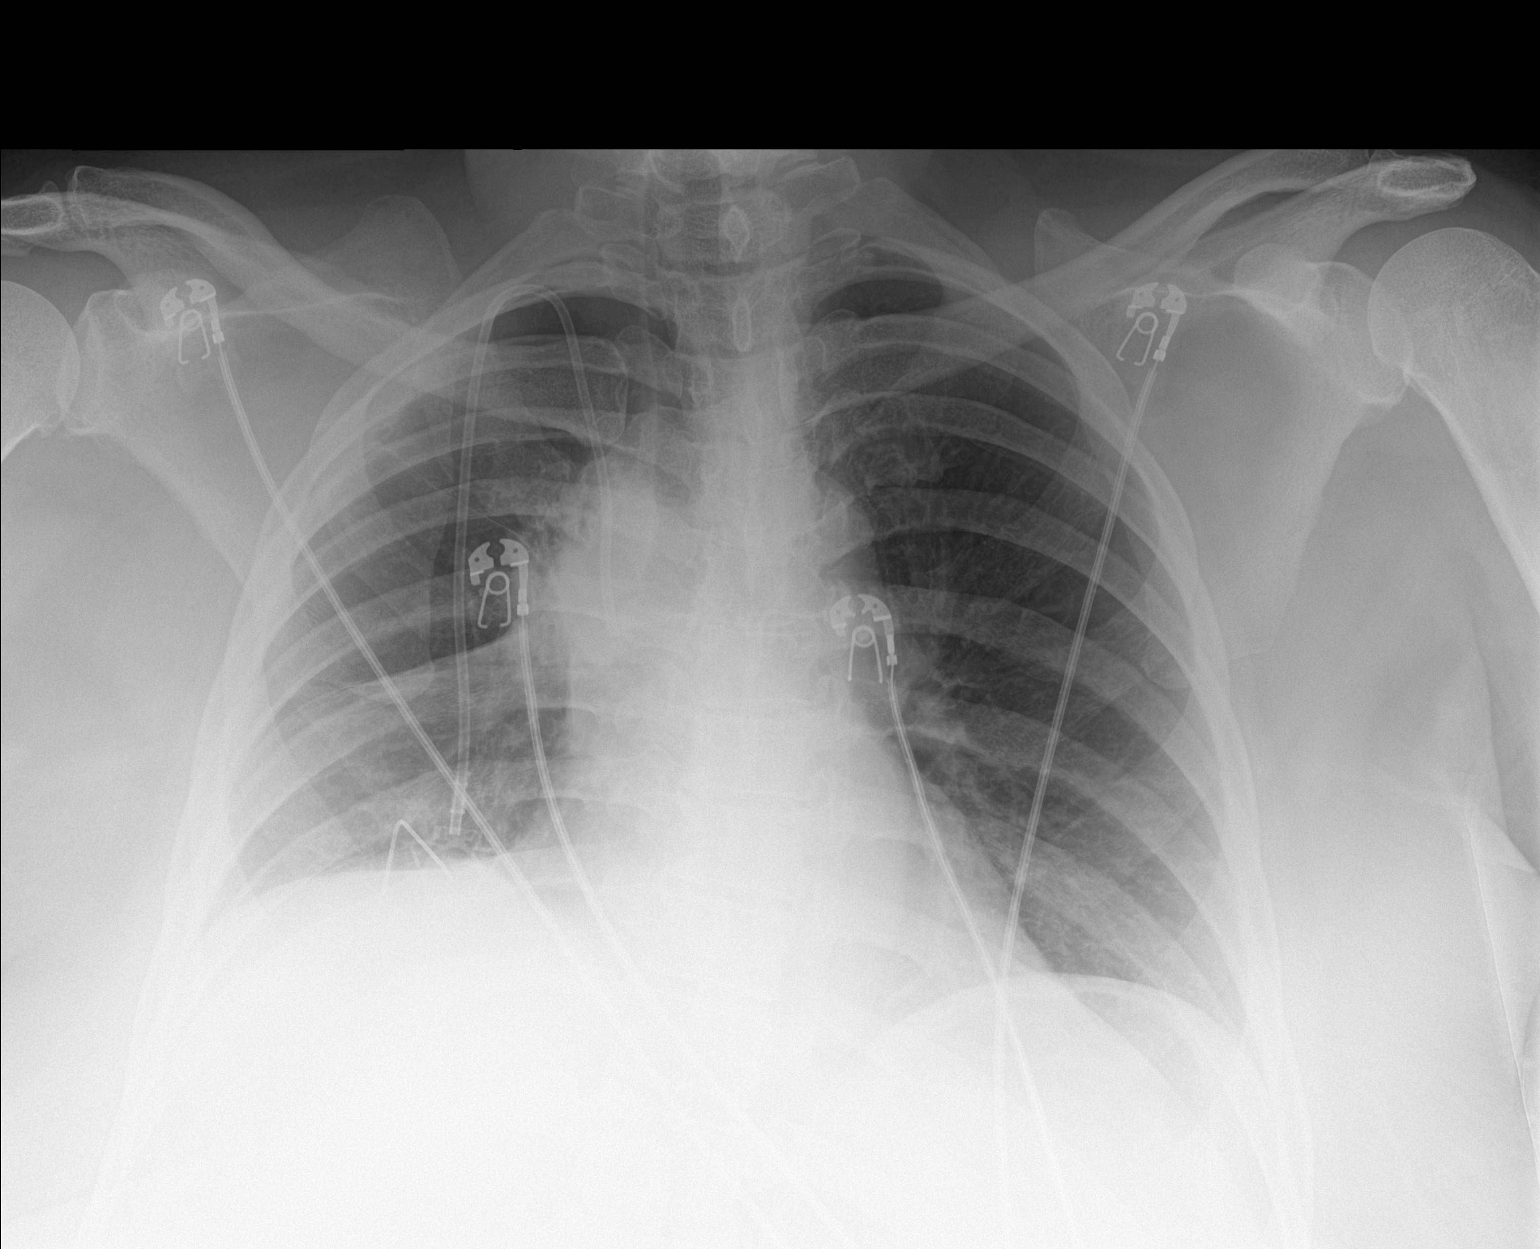

[1 of 1 positions shown; findings below may reference images not displayed]

FINDINGS: Volume loss in the right hemithorax. Hazy densities at the right
lung base may represent atelectasis. Persistent densities in the
right hilar region compatible with areas of consolidation or volume
loss. Right hilar densities are unchanged. Negative for right
pneumothorax. Lucency lateral to the right hilar soft tissue is
similar to the prior chest radiograph. Left lung is clear. Heart and
mediastinum are stable.
IMPRESSION: 1. Negative for pneumothorax following the right thoracentesis.
2. Persistent volume loss in the right hemithorax with stable
densities in the right hilar region.

## 2019-10-27 IMAGING — CT CT CHEST WITH CONTRAST
2 of 5 series · 12 of 36 positions shown, 15 images · IV contrast (omnipaque)
Comparison: Chest CT 11/16/2018. CT the abdomen and pelvis
05/30/2018. Multiple other priors.

CLINICAL DATA: 53-year-old female with history of stage IV lung
cancer undergoing ongoing chemotherapy. Mid chest pain for the past
3 weeks. Cough and shortness of breath. Follow-up study.

EXAM:
CT CHEST, ABDOMEN, AND PELVIS WITH CONTRAST
TECHNIQUE: Multidetector CT imaging of the chest, abdomen and pelvis was
performed following the standard protocol during bolus
administration of intravenous contrast.
CONTRAST:  100mL OMNIPAQUE IOHEXOL 300 MG/ML  SOLN

[Series 2: cap with · axial · 0.78mm/px · z∈[+1134,+1664]mm · 9 of 130 slices shown, 12 images]
[im 12/130  mediastinal]
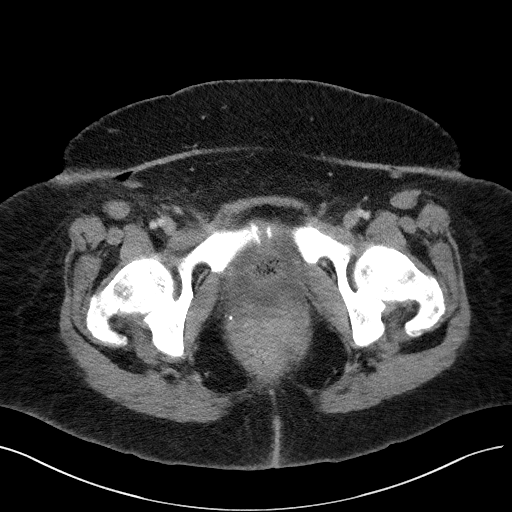
[im 12/130  lung]
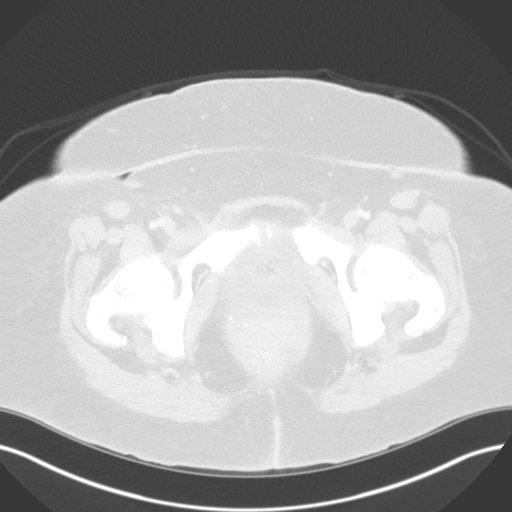
[im 24/130  lung]
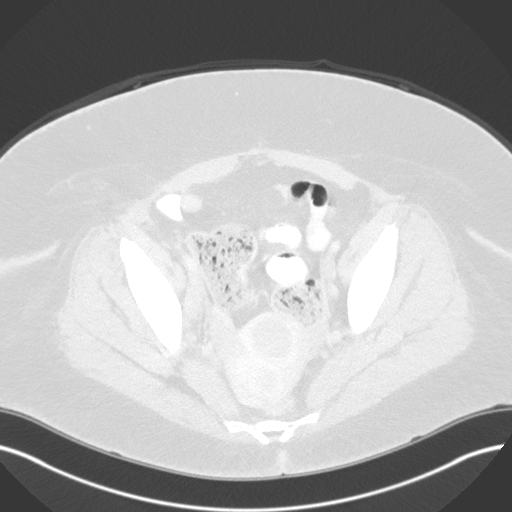
[im 36/130  lung]
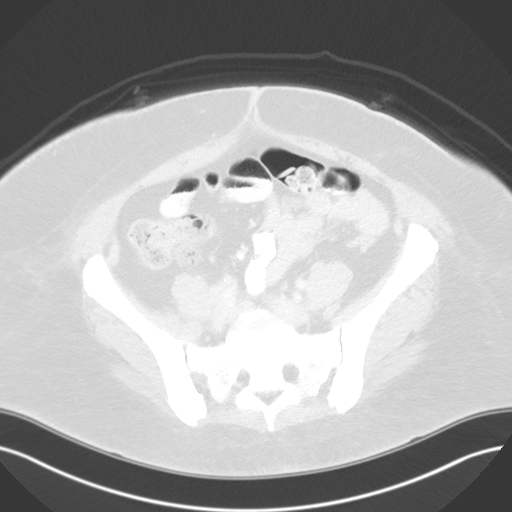
[im 47/130  lung]
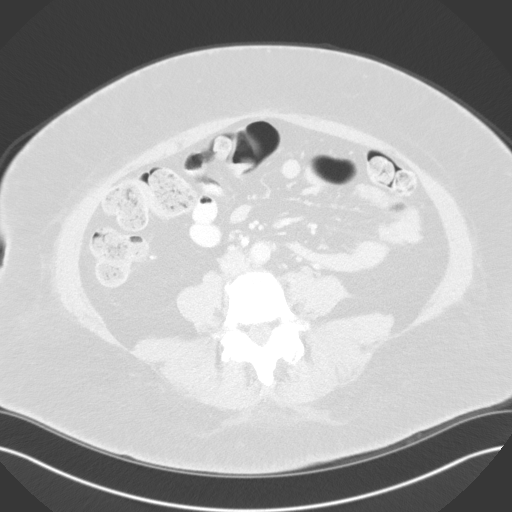
[im 71/130  mediastinal]
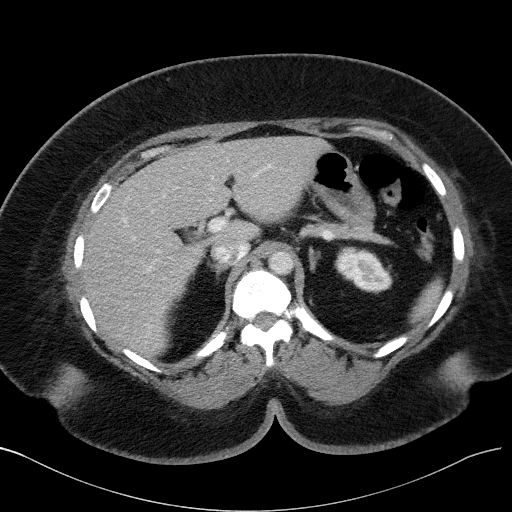
[im 71/130  lung]
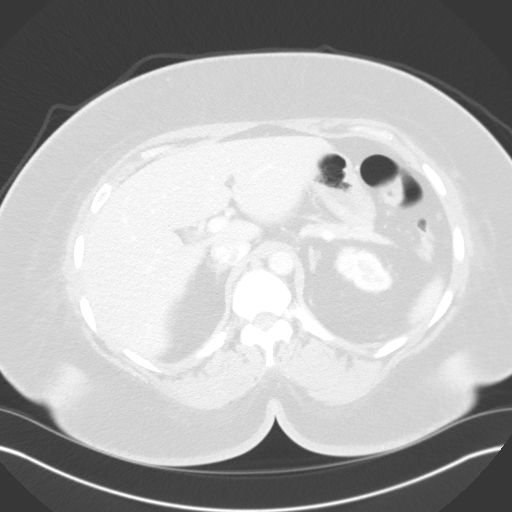
[im 83/130  lung]
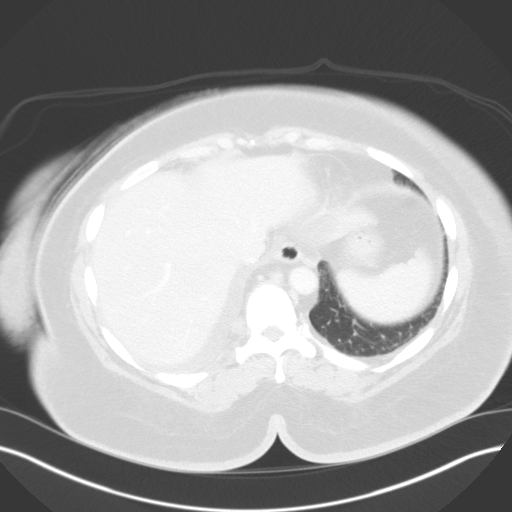
[im 94/130  lung]
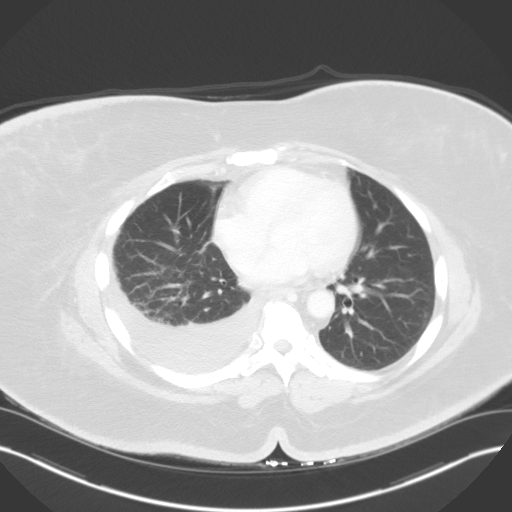
[im 106/130  lung]
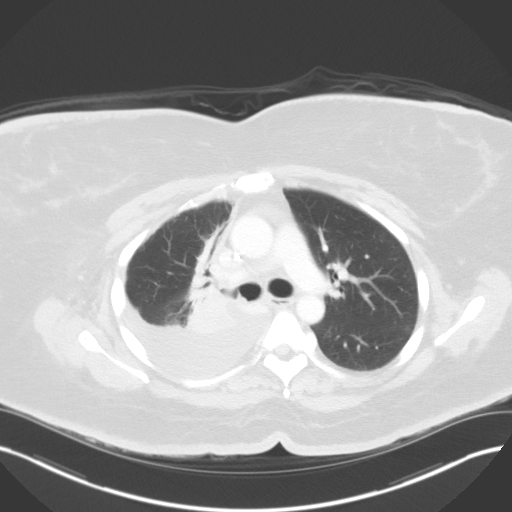
[im 118/130  mediastinal]
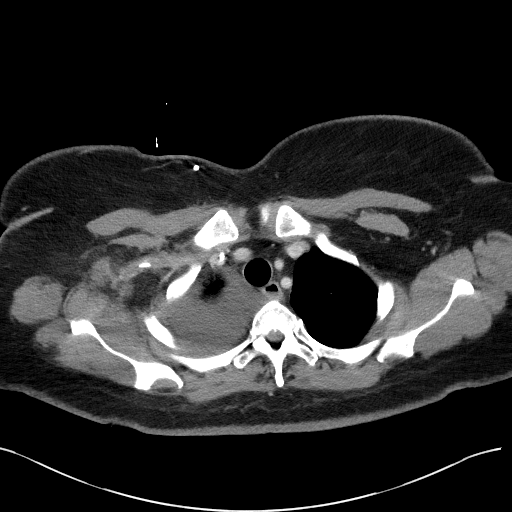
[im 118/130  lung]
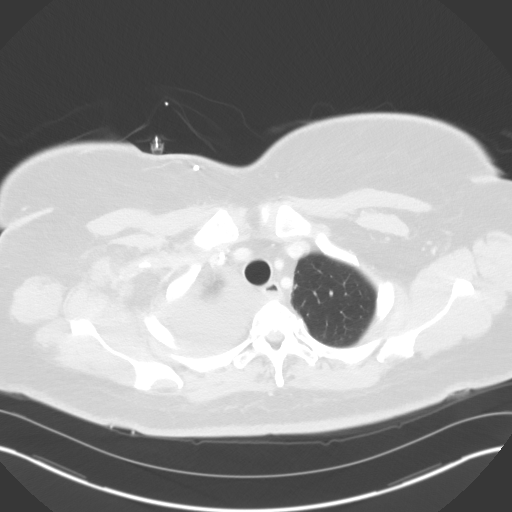

[Series 4: coronals · coronal · 0.94mm/px · 3 of 133 slices shown]
[im 27/133  lung]
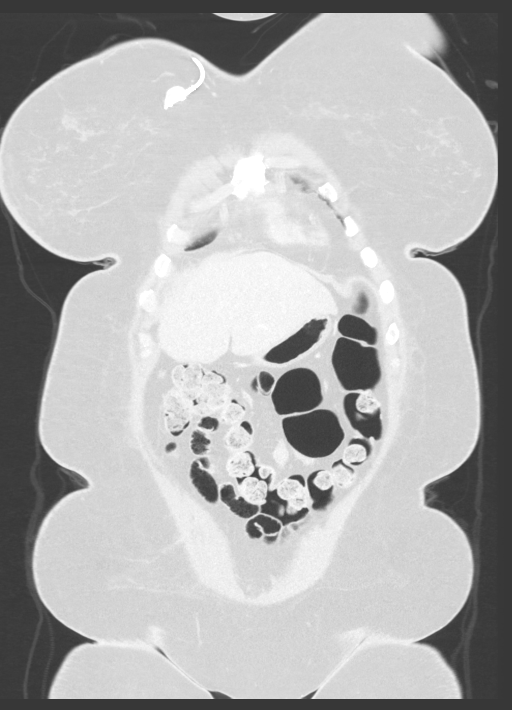
[im 53/133  lung]
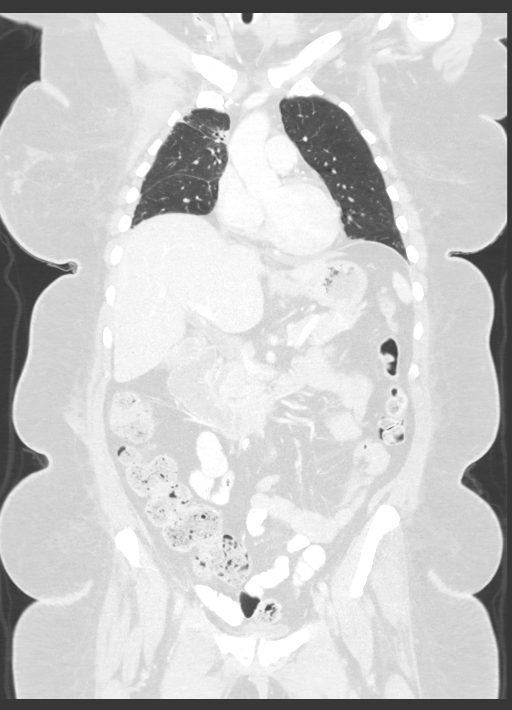
[im 80/133  lung]
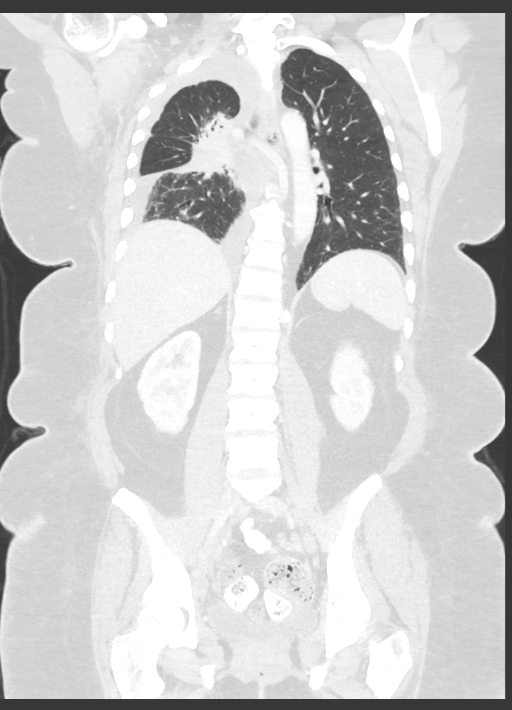

[12 of 36 positions shown; findings below may reference images not displayed]

FINDINGS: CT CHEST FINDINGS

Cardiovascular: Heart size is normal. There is no significant
pericardial fluid, thickening or pericardial calcification. There is
aortic atherosclerosis, as well as atherosclerosis of the great
vessels of the mediastinum and the coronary arteries, including
calcified atherosclerotic plaque in the left anterior descending,
left circumflex and right coronary arteries.

Mediastinum/Nodes: Low right paratracheal lymph node measuring
cm in short axis. Esophagus is unremarkable in appearance. Enlarged
right axillary lymph node measuring 2.7 cm in short axis, similar to
the prior study. Other smaller right axillary lymph nodes are also
noted.

Lungs/Pleura: Chronic mass-like areas of architectural distortion in
the perihilar aspect of the right upper lobe and superior segment of
the right lower lobe, similar to the prior study, most compatible
with chronic postradiation mass-like fibrosis. Moderate right
pleural effusion lying dependently. No acute consolidative airspace
disease. No pleural effusions. No suspicious appearing pulmonary
nodules or masses are noted.

Musculoskeletal: There are no aggressive appearing lytic or blastic
lesions noted in the visualized portions of the skeleton.

CT ABDOMEN PELVIS FINDINGS

Hepatobiliary: Ill-defined low-attenuation area in segment 4B of the
liver adjacent to the falciform ligament, incompletely characterize,
but similar to focal low-attenuation seen on prior study 05/30/2018,
most compatible with focal fatty infiltration. No other suspicious
cystic or solid hepatic lesions. No intra or extrahepatic biliary
ductal dilatation. Gallbladder is contracted around several
noncalcified gallstones. No surrounding pericholecystic fluid or
inflammatory changes to suggest an acute cholecystitis at this time.

Pancreas: No pancreatic mass. No pancreatic ductal dilatation. No
pancreatic or peripancreatic fluid collections or inflammatory
changes.

Spleen: Unremarkable.

Adrenals/Urinary Tract: Bilateral kidneys and adrenal glands are
normal in appearance. No hydroureteronephrosis. Urinary bladder is
normal in appearance.

Stomach/Bowel: Normal appearance of the stomach. No pathologic
dilatation of small bowel or colon. The appendix is not confidently
identified and may be surgically absent. Regardless, there are no
inflammatory changes noted adjacent to the cecum to suggest the
presence of an acute appendicitis at this time.

Vascular/Lymphatic: Aortic atherosclerosis, without evidence of
aneurysm or dissection in the abdominal or pelvic vasculature.
Multiple prominent borderline enlarged and mildly enlarged
retroperitoneal and pelvic lymph nodes. The largest retroperitoneal
lymph nodes measure up to 1 cm in short axis in the aortocaval nodal
station (axial image 75 of series 2). The largest pelvic lymph nodes
are in the right internal iliac nodal chain measuring up to 1.5 cm
in short axis on axial image 103 of series 2 and 109 of series 2.

Reproductive: Uterus is enlarged and very heterogeneous in
appearance, presumably from multiple fibroids. Ovaries are
unremarkable in appearance.

Other: No significant volume of ascites.  No pneumoperitoneum.

Musculoskeletal: There are no aggressive appearing lytic or blastic
lesions noted in the visualized portions of the skeleton.
IMPRESSION: 1. Persistent low right paratracheal lymphadenopathy, similar to the
prior study. This may reflect residual disease. Stable area of
presumed postradiation mass-like fibrosis in the perihilar aspect of
the right upper lung.
2. Moderate right pleural effusion is similar to the prior
examination.
3. Lymphadenopathy also noted in the right axillary region, right
hemipelvis and retroperitoneum, as above, potentially reflective of
metastatic disease.
4. Focal low-attenuation region in segment 4B of the liver, similar
to prior noncontrast CT examination of the abdomen, likely to
represent focal fatty infiltration.
5. Cholelithiasis without evidence of acute cholecystitis at this
time.
6. Aortic atherosclerosis, in addition to 3 vessel coronary artery
disease. Please note that although the presence of coronary artery
calcium documents the presence of coronary artery disease, the
severity of this disease and any potential stenosis cannot be
assessed on this non-gated CT examination. Assessment for potential
risk factor modification, dietary therapy or pharmacologic therapy
may be warranted, if clinically indicated.
7. Additional incidental findings, as above.

## 2019-10-27 IMAGING — CT CT ABDOMEN AND PELVIS WITH CONTRAST
2 of 5 series · 13 of 46 positions shown, 15 images · IV contrast (omnipaque)
Comparison: Chest CT 11/16/2018. CT the abdomen and pelvis
05/30/2018. Multiple other priors.

CLINICAL DATA: 53-year-old female with history of stage IV lung
cancer undergoing ongoing chemotherapy. Mid chest pain for the past
3 weeks. Cough and shortness of breath. Follow-up study.

EXAM:
CT CHEST, ABDOMEN, AND PELVIS WITH CONTRAST
TECHNIQUE: Multidetector CT imaging of the chest, abdomen and pelvis was
performed following the standard protocol during bolus
administration of intravenous contrast.
CONTRAST:  100mL OMNIPAQUE IOHEXOL 300 MG/ML  SOLN

[Series 2: cap with · axial · 0.78mm/px · z∈[+1124,+1674]mm · 10 of 130 slices shown, 12 images]
[im 10/130  soft-tissue]
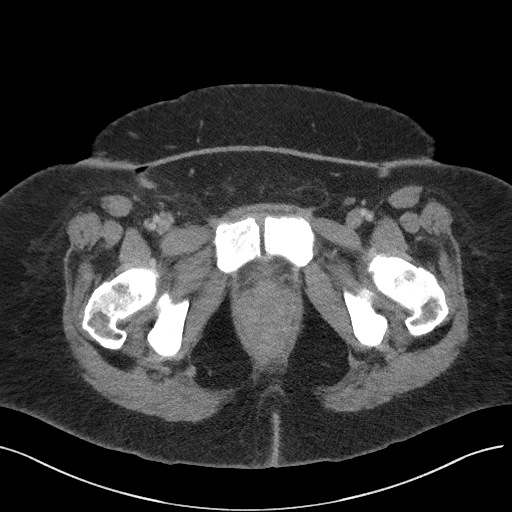
[im 10/130  bone]
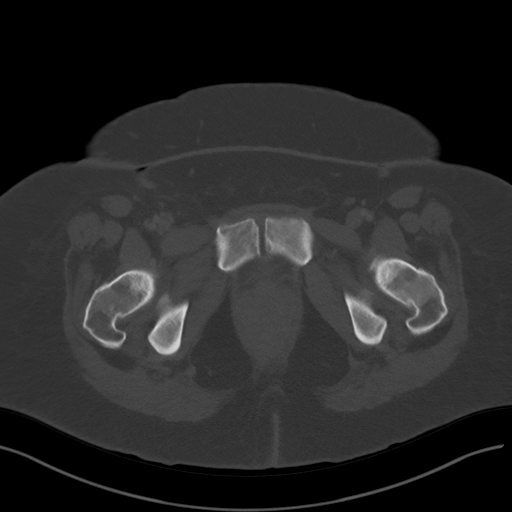
[im 20/130  soft-tissue]
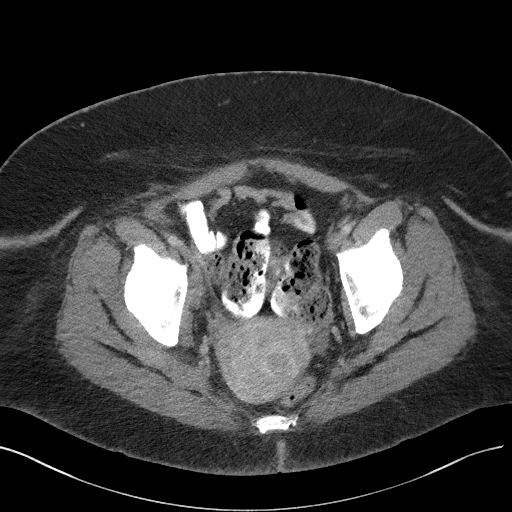
[im 40/130  soft-tissue]
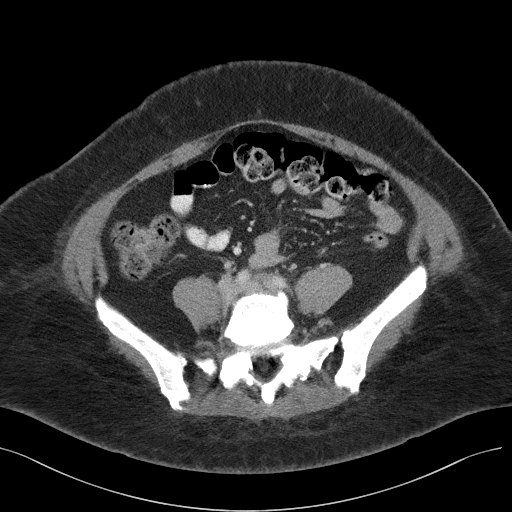
[im 50/130  soft-tissue]
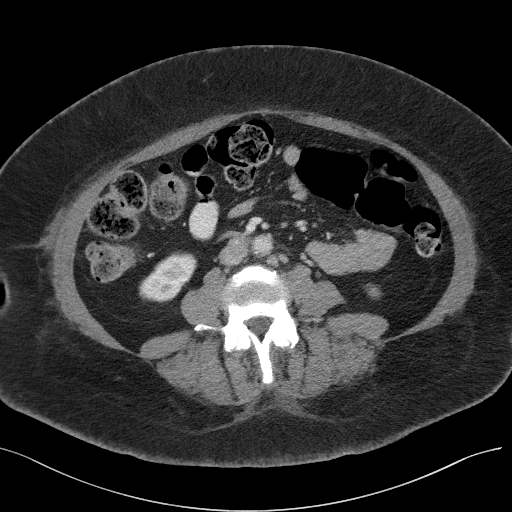
[im 60/130  soft-tissue]
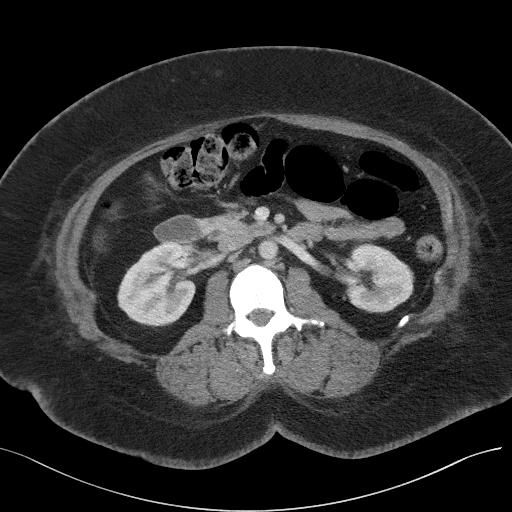
[im 70/130  soft-tissue]
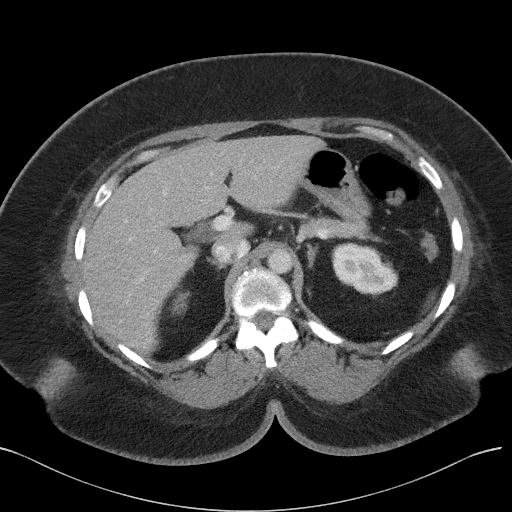
[im 80/130  soft-tissue]
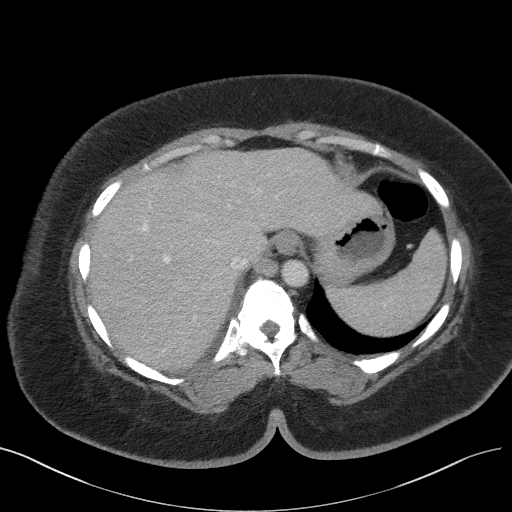
[im 100/130  soft-tissue]
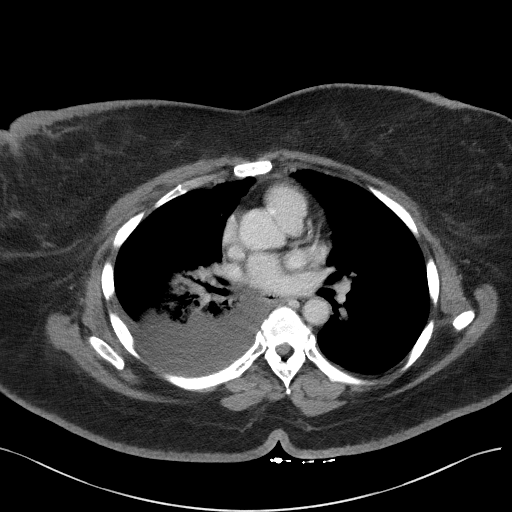
[im 110/130  soft-tissue]
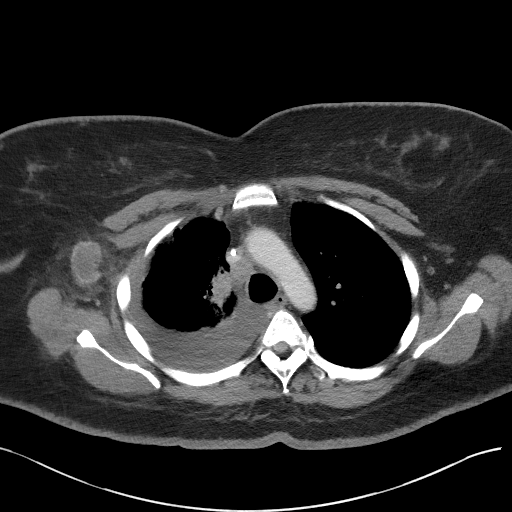
[im 110/130  bone]
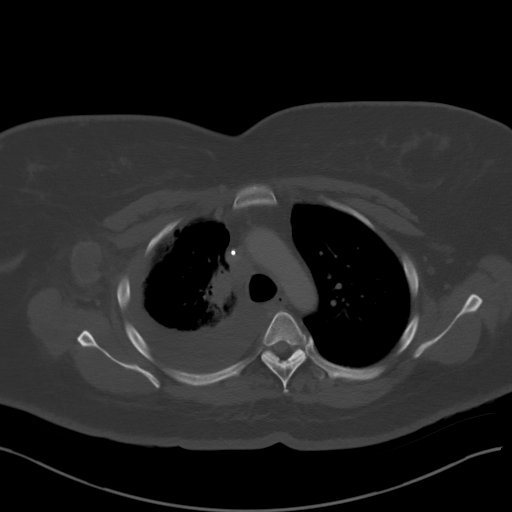
[im 120/130  soft-tissue]
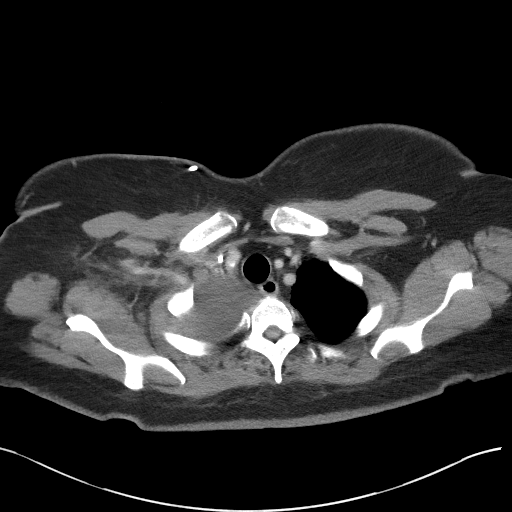

[Series 4: coronals · coronal · 0.94mm/px · 3 of 133 slices shown]
[im 45/133  soft-tissue]
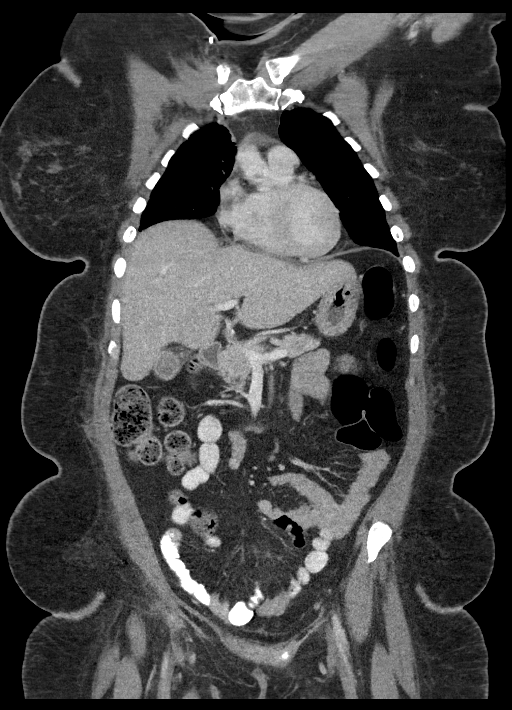
[im 59/133  soft-tissue]
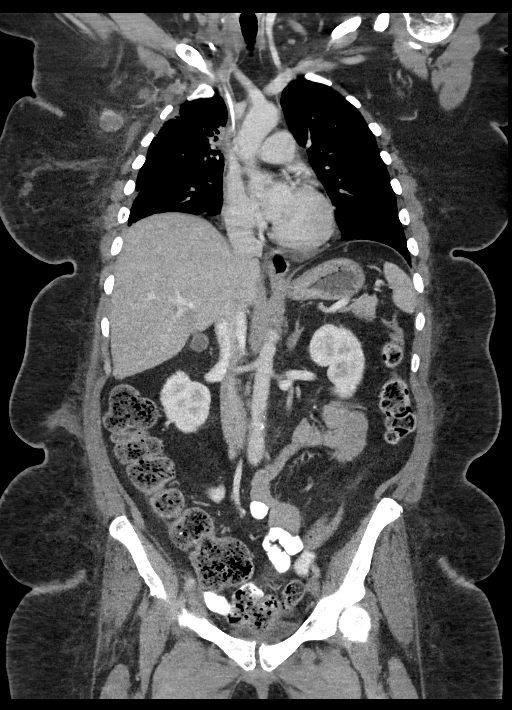
[im 74/133  soft-tissue]
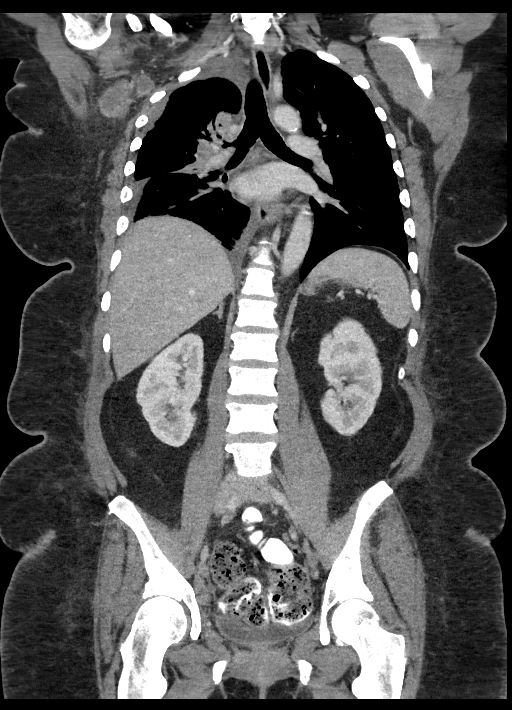

[13 of 46 positions shown; findings below may reference images not displayed]

FINDINGS: CT CHEST FINDINGS

Cardiovascular: Heart size is normal. There is no significant
pericardial fluid, thickening or pericardial calcification. There is
aortic atherosclerosis, as well as atherosclerosis of the great
vessels of the mediastinum and the coronary arteries, including
calcified atherosclerotic plaque in the left anterior descending,
left circumflex and right coronary arteries.

Mediastinum/Nodes: Low right paratracheal lymph node measuring
cm in short axis. Esophagus is unremarkable in appearance. Enlarged
right axillary lymph node measuring 2.7 cm in short axis, similar to
the prior study. Other smaller right axillary lymph nodes are also
noted.

Lungs/Pleura: Chronic mass-like areas of architectural distortion in
the perihilar aspect of the right upper lobe and superior segment of
the right lower lobe, similar to the prior study, most compatible
with chronic postradiation mass-like fibrosis. Moderate right
pleural effusion lying dependently. No acute consolidative airspace
disease. No pleural effusions. No suspicious appearing pulmonary
nodules or masses are noted.

Musculoskeletal: There are no aggressive appearing lytic or blastic
lesions noted in the visualized portions of the skeleton.

CT ABDOMEN PELVIS FINDINGS

Hepatobiliary: Ill-defined low-attenuation area in segment 4B of the
liver adjacent to the falciform ligament, incompletely characterize,
but similar to focal low-attenuation seen on prior study 05/30/2018,
most compatible with focal fatty infiltration. No other suspicious
cystic or solid hepatic lesions. No intra or extrahepatic biliary
ductal dilatation. Gallbladder is contracted around several
noncalcified gallstones. No surrounding pericholecystic fluid or
inflammatory changes to suggest an acute cholecystitis at this time.

Pancreas: No pancreatic mass. No pancreatic ductal dilatation. No
pancreatic or peripancreatic fluid collections or inflammatory
changes.

Spleen: Unremarkable.

Adrenals/Urinary Tract: Bilateral kidneys and adrenal glands are
normal in appearance. No hydroureteronephrosis. Urinary bladder is
normal in appearance.

Stomach/Bowel: Normal appearance of the stomach. No pathologic
dilatation of small bowel or colon. The appendix is not confidently
identified and may be surgically absent. Regardless, there are no
inflammatory changes noted adjacent to the cecum to suggest the
presence of an acute appendicitis at this time.

Vascular/Lymphatic: Aortic atherosclerosis, without evidence of
aneurysm or dissection in the abdominal or pelvic vasculature.
Multiple prominent borderline enlarged and mildly enlarged
retroperitoneal and pelvic lymph nodes. The largest retroperitoneal
lymph nodes measure up to 1 cm in short axis in the aortocaval nodal
station (axial image 75 of series 2). The largest pelvic lymph nodes
are in the right internal iliac nodal chain measuring up to 1.5 cm
in short axis on axial image 103 of series 2 and 109 of series 2.

Reproductive: Uterus is enlarged and very heterogeneous in
appearance, presumably from multiple fibroids. Ovaries are
unremarkable in appearance.

Other: No significant volume of ascites.  No pneumoperitoneum.

Musculoskeletal: There are no aggressive appearing lytic or blastic
lesions noted in the visualized portions of the skeleton.
IMPRESSION: 1. Persistent low right paratracheal lymphadenopathy, similar to the
prior study. This may reflect residual disease. Stable area of
presumed postradiation mass-like fibrosis in the perihilar aspect of
the right upper lung.
2. Moderate right pleural effusion is similar to the prior
examination.
3. Lymphadenopathy also noted in the right axillary region, right
hemipelvis and retroperitoneum, as above, potentially reflective of
metastatic disease.
4. Focal low-attenuation region in segment 4B of the liver, similar
to prior noncontrast CT examination of the abdomen, likely to
represent focal fatty infiltration.
5. Cholelithiasis without evidence of acute cholecystitis at this
time.
6. Aortic atherosclerosis, in addition to 3 vessel coronary artery
disease. Please note that although the presence of coronary artery
calcium documents the presence of coronary artery disease, the
severity of this disease and any potential stenosis cannot be
assessed on this non-gated CT examination. Assessment for potential
risk factor modification, dietary therapy or pharmacologic therapy
may be warranted, if clinically indicated.
7. Additional incidental findings, as above.

## 2019-10-27 IMAGING — NM NUCLEAR MEDICINE WHOLE BODY BONE SCINTIGRAPHY
2 series · 2 of 2 positions shown · non-contrast
Comparison: Correlation: PET-CT

08/03/2017, CT chest abdomen pelvis 11/29/2018

CLINICAL DATA: Non-small cell lung cancer

EXAM:
NUCLEAR MEDICINE WHOLE BODY BONE SCAN
TECHNIQUE: Whole body anterior and posterior images were obtained approximately
3 hours after intravenous injection of radiopharmaceutical.
RADIOPHARMACEUTICALS:  21 mCi Fechnetium-XXm MDP IV

[Series 1: whole body · 2.66mm/px · 1 of 1 slices shown (1 of 2)]
[im 1/1]
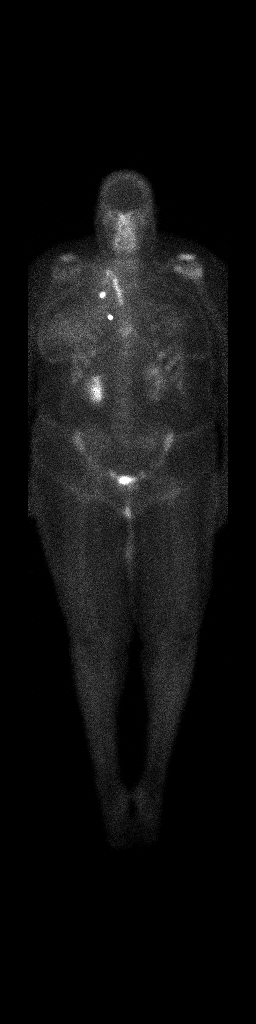

[Series 1: whole body · 2.66mm/px · 1 of 1 slices shown (2 of 2)]
[im 1/1]
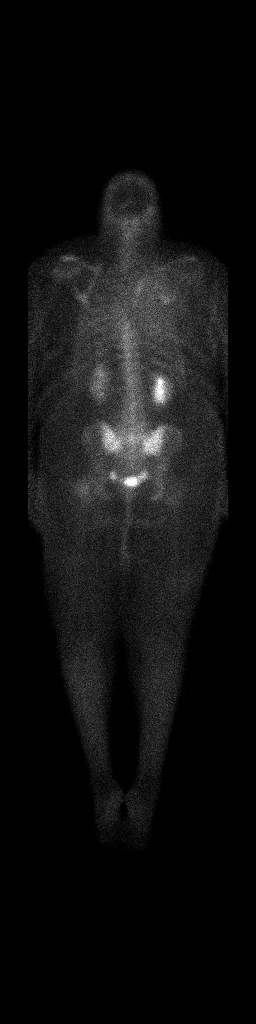

[2 of 2 positions shown; findings below may reference images not displayed]

FINDINGS: Decreased tracer localization is identified within upper thoracic
spine and in upper LEFT ribs.

Retained tracer within RIGHT jugular Port-A-Cath reservoir and
tubing.

Questionable foci of increased tracer accumulation at the calvarium
especially posterior RIGHT temporal region and LEFT parietal region,
nonspecific but cannot exclude osseous metastases.

Nonspecific uptake of tracer at the distal sternum/xiphoid process,
no abnormality seen on CT.

No additional worrisome sites of osseous tracer accumulation are
identified.

Asymmetric tracer retention within the RIGHT renal collecting system
versus LEFT though no hydronephrosis is identified on CT.

Diffuse uptake of tracer in the RIGHT hemithorax corresponding to
pleural effusion on CT.

Remaining urinary tract and soft tissue distribution of tracer is
unremarkable.
IMPRESSION: Questionable foci of increased tracer accumulation in the calvaria
and at the distal sternum/xiphoid process, uncertain etiology,
cannot exclude osseous metastatic disease

No other worrisome sites of osseous tracer accumulation are
identified.

Decreased tracer uptake at the thoracic spine and at upper LEFT
ribs, nonspecific, can be seen at radiation therapy ports recommend
correlation with patient history.

RIGHT pleural effusion.

## 2019-11-07 IMAGING — DX DG CHEST 1V PORT
1 series · 1 of 1 positions shown · non-contrast
Comparison: November 16, 2018

CLINICAL DATA: Shortness of breath.

EXAM:
PORTABLE CHEST 1 VIEW

[chest ap]
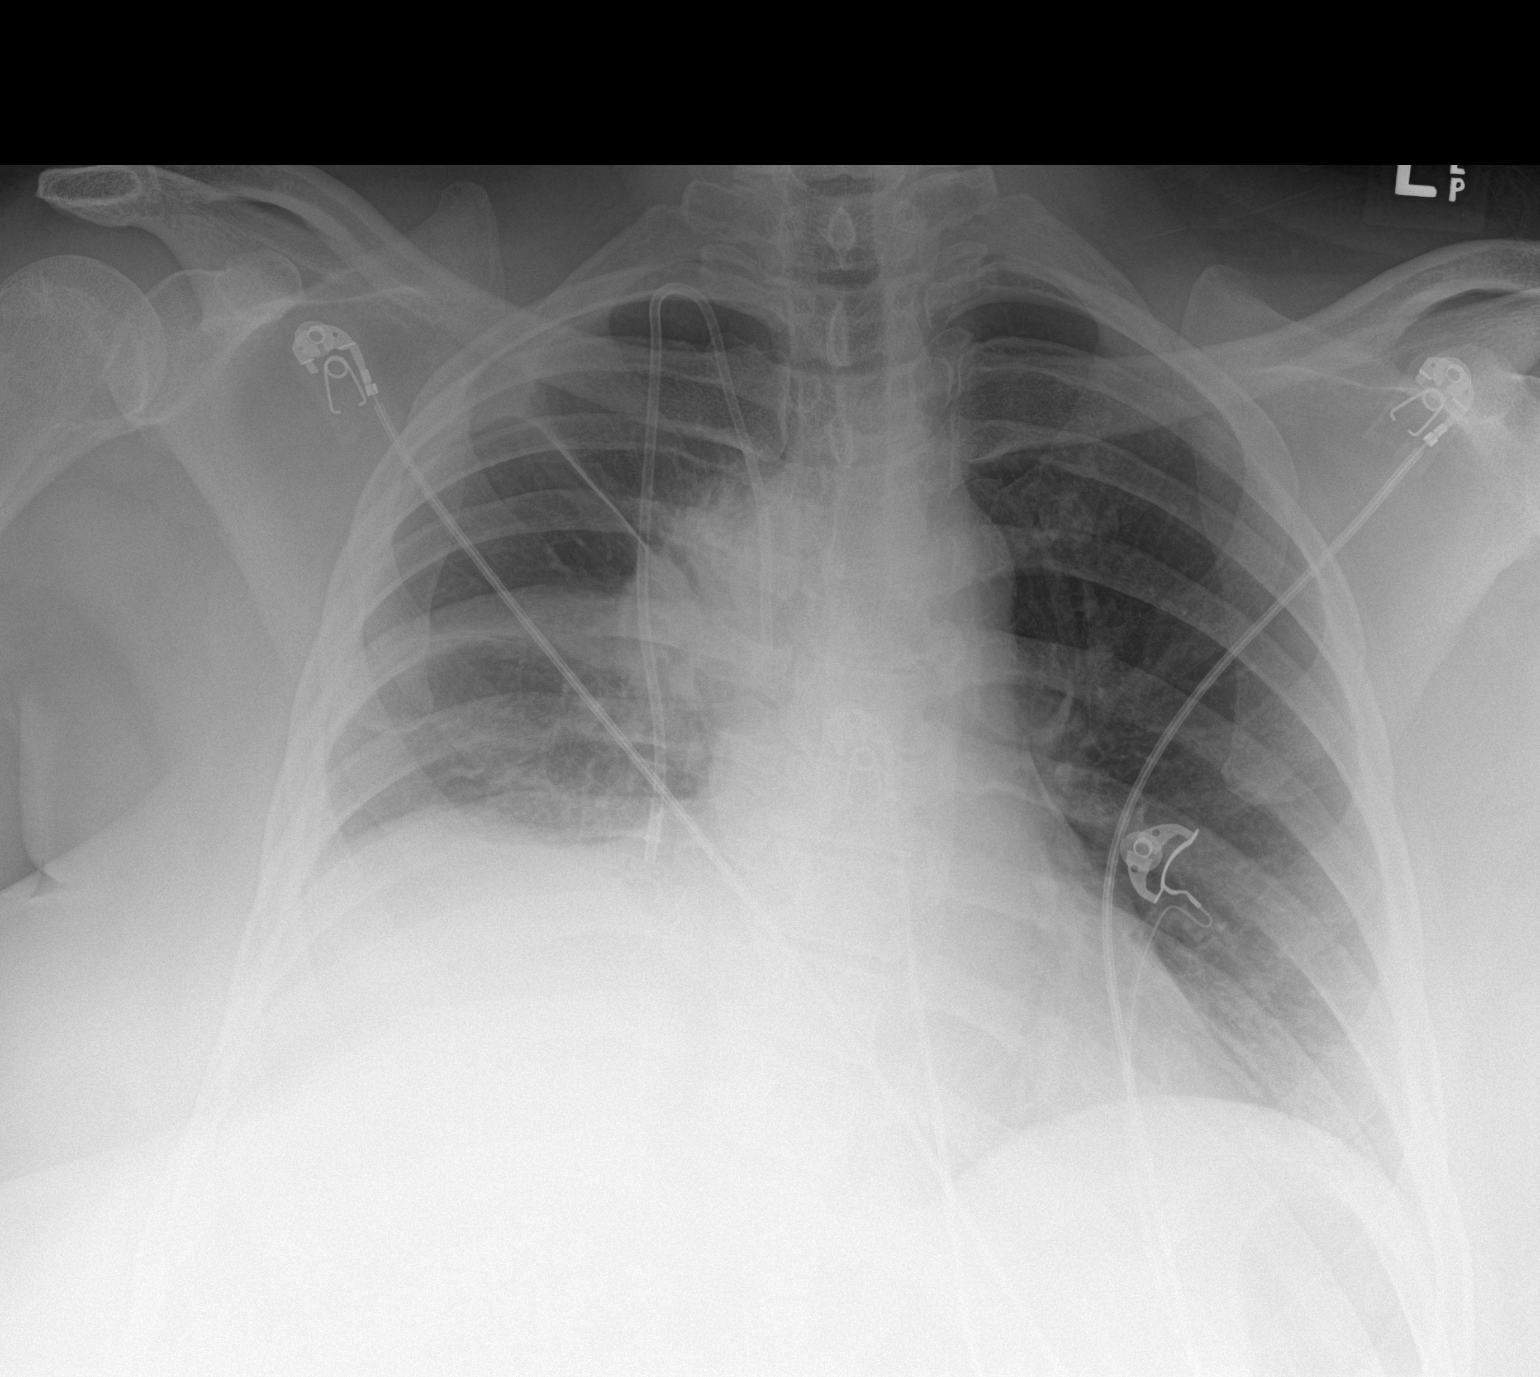

[1 of 1 positions shown; findings below may reference images not displayed]

FINDINGS: Right injectable port in stable position.

The cardiac silhouette is normal.

Clear left lung.

Increased right pleural effusion with volume loss in the right
hemithorax.

Stable right perihilar mass.

Osseous structures are without acute abnormality. Soft tissues are
grossly normal.
IMPRESSION: 1. Increased right pleural effusion with volume loss in the right
hemithorax.
2. Stable right perihilar mass.

## 2019-11-07 IMAGING — CT CT ANGIO CHEST
2 of 6 series · 18 of 36 positions shown · IV contrast (omnipaque)
Comparison: 11/29/2018

CLINICAL DATA: Shortness of breath and coughing

EXAM:
CT ANGIOGRAPHY CHEST WITH CONTRAST
TECHNIQUE: Multidetector CT imaging of the chest was performed using the
standard protocol during bolus administration of intravenous
contrast. Multiplanar CT image reconstructions and MIPs were
obtained to evaluate the vascular anatomy.
CONTRAST:  100mL OMNIPAQUE IOHEXOL 350 MG/ML SOLN

[Series 7: thins · axial · 0.65mm/px · z∈[-224,-13]mm · 17 of 235 slices shown]
[im 12/235  lung]
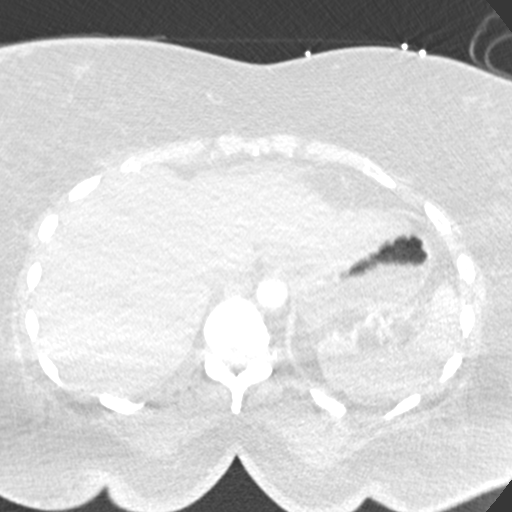
[im 24/235  mediastinal]
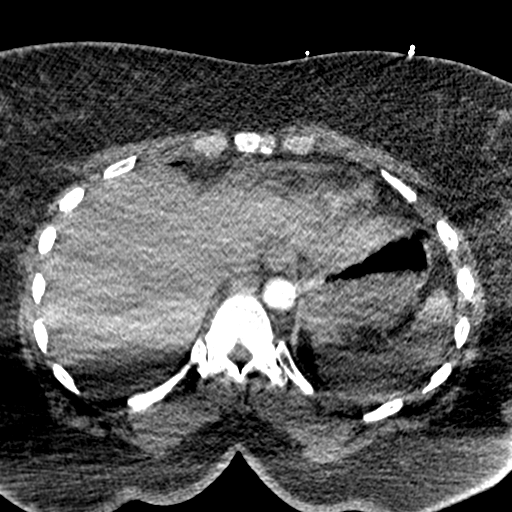
[im 36/235  lung]
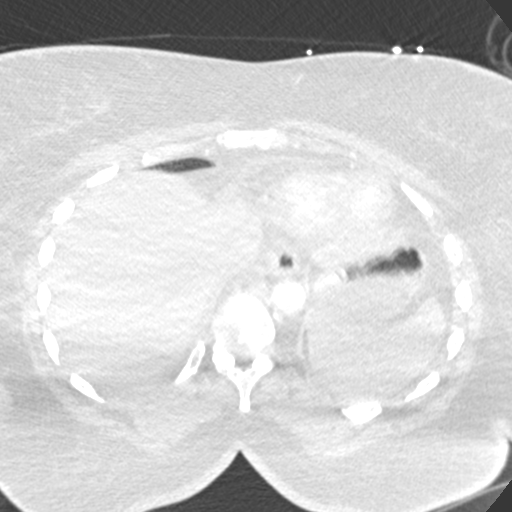
[im 47/235  mediastinal]
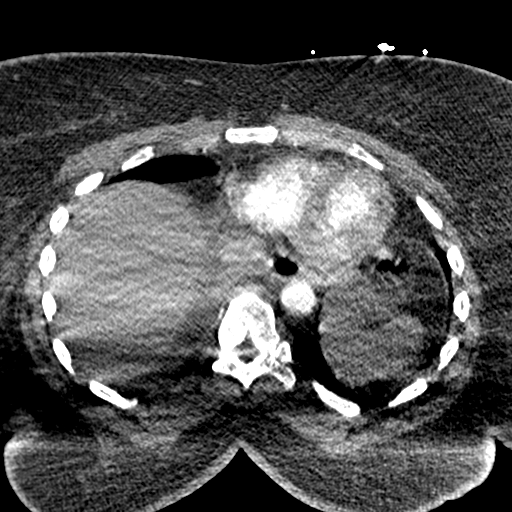
[im 71/235  lung]
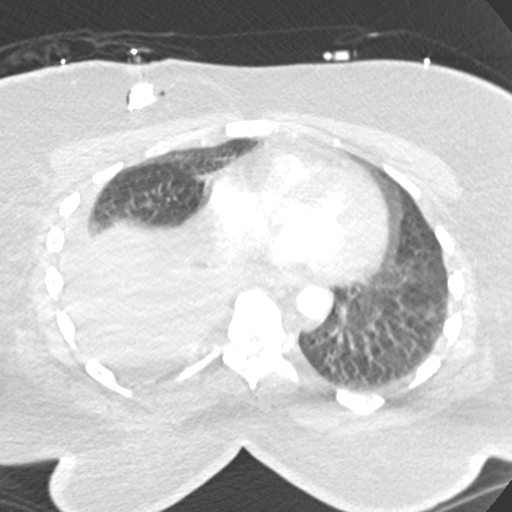
[im 82/235  mediastinal]
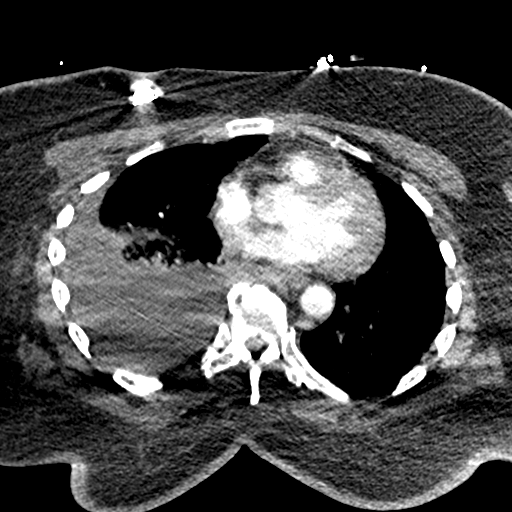
[im 94/235  lung]
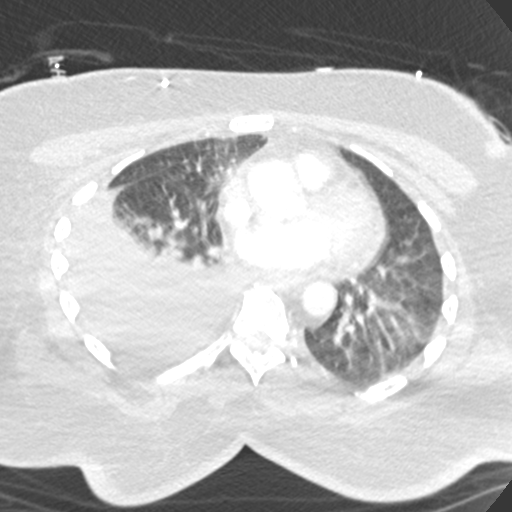
[im 106/235  mediastinal]
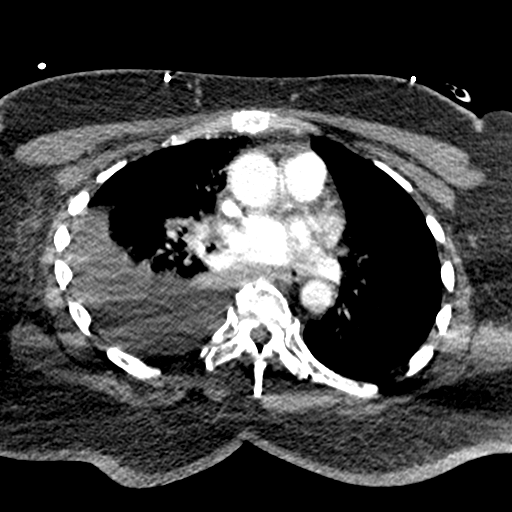
[im 118/235  lung]
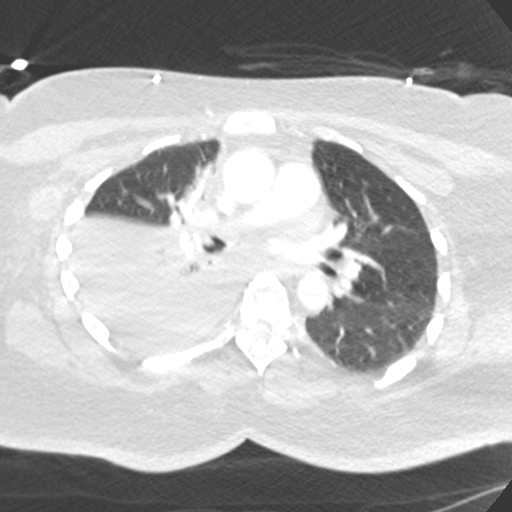
[im 129/235  mediastinal]
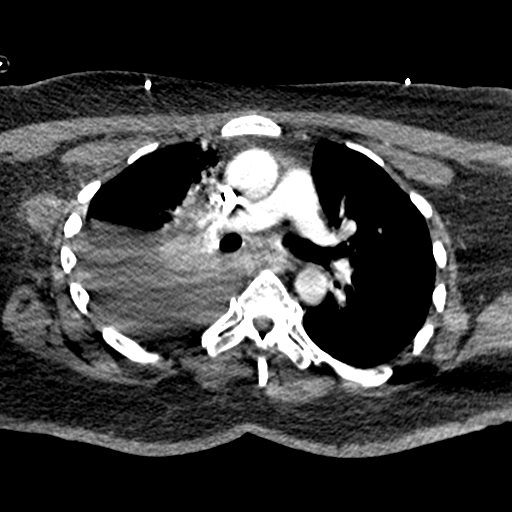
[im 141/235  lung]
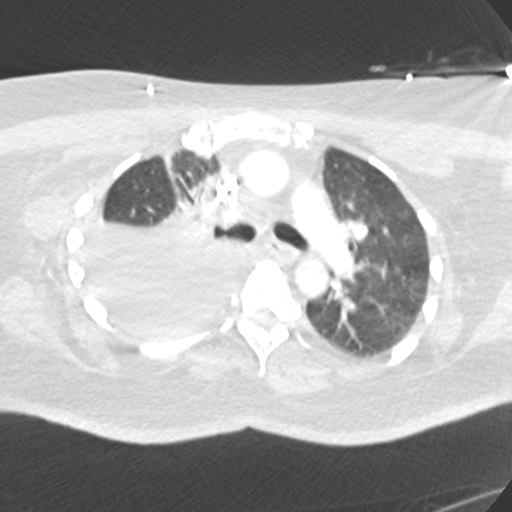
[im 153/235  mediastinal]
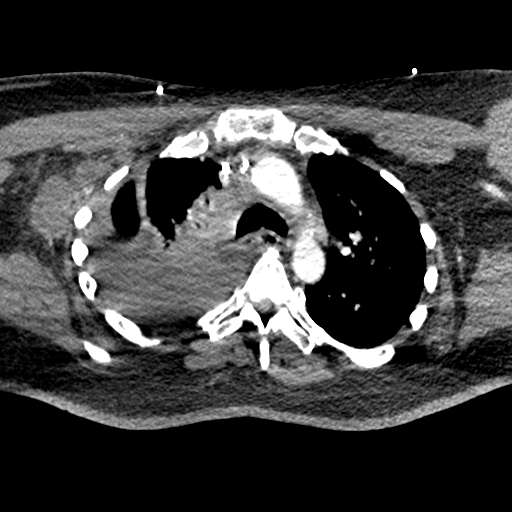
[im 164/235  lung]
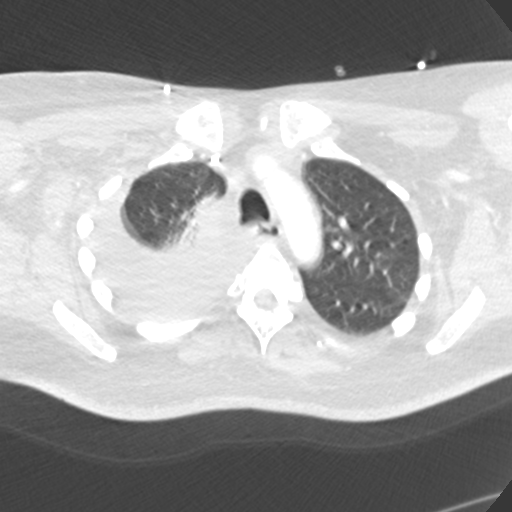
[im 188/235  mediastinal]
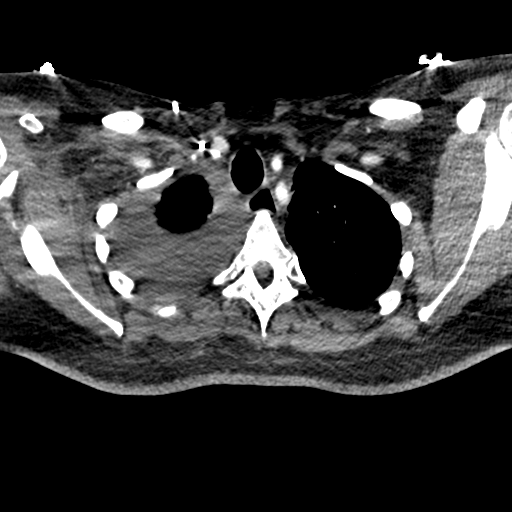
[im 199/235  lung]
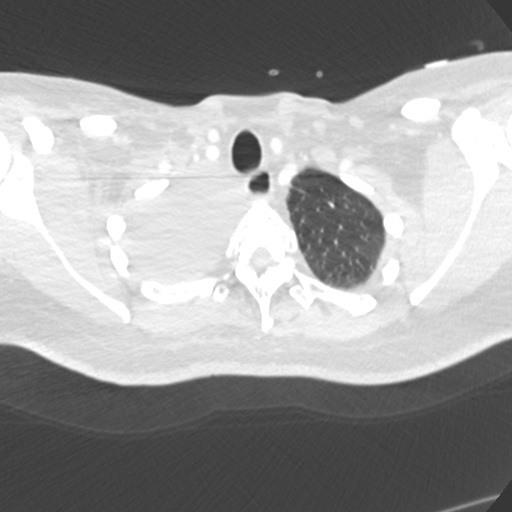
[im 211/235  mediastinal]
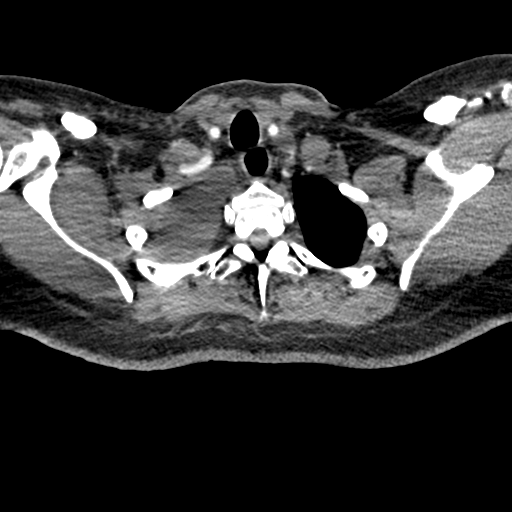
[im 223/235  lung]
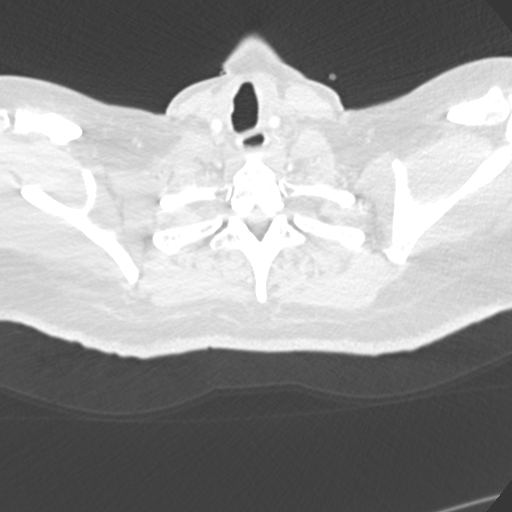

[Series 9: coronal mpr · coronal · 0.47mm/px · 1 of 150 slices shown]
[im 75/150  mediastinal]
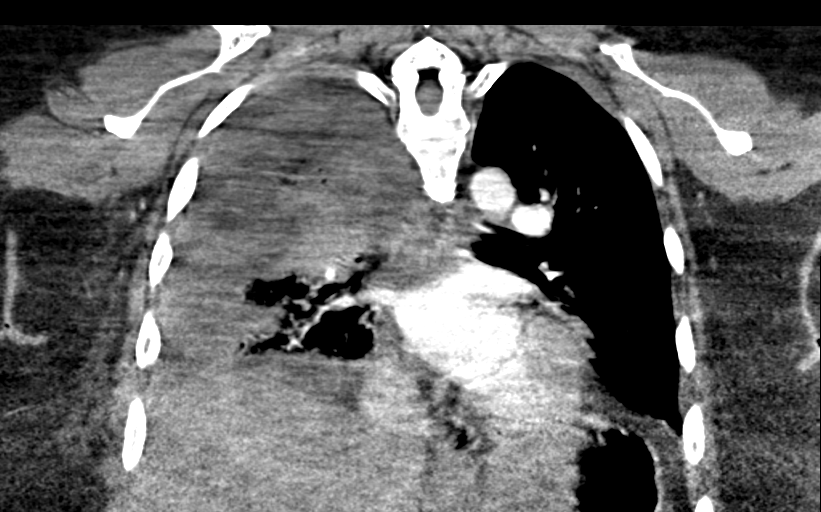

[18 of 36 positions shown; findings below may reference images not displayed]

FINDINGS: The study is somewhat limited due to patient motion.

Cardiovascular: There is a optimal opacification of the pulmonary
arteries. There is no central,segmental, or subsegmental filling
defects within the pulmonary arteries. There is mild cardiomegaly.
No evidence of right ventricular heart strain. There is normal
three-vessel brachiocephalic anatomy without proximal stenosis. The
thoracic aorta is normal in appearance.

Mediastinum/Nodes: There is soft tissue thickening seen at along the
right paratracheal stripe as on the prior exam. There is marked
right axillary adenopathy with areas of central necrosis and
surrounding fat stranding changes as on the prior exam.

Lungs/Pleura: There is interval large min of a moderate to large
right pleural effusion. Postradiation fibrotic changes are seen in
the medial right upper lung. There is hazy bibasilar atelectasis.

Upper Abdomen: No acute abnormalities present in the visualized
portions of the upper abdomen.

Musculoskeletal: No chest wall abnormality. No acute or significant
osseous findings.

Review of the MIP images confirms the above findings.
IMPRESSION: 1. No pulmonary embolism.
2. Interval enlargement of moderate to large right pleural effusion
3. Post radiation fibrotic changes seen in the medial right upper
lung.
4. Right axillary adenopathy with surrounding inflammatory changes
and central necrosis.

## 2019-11-08 IMAGING — US US THORACENTESIS ASP PLEURAL SPACE W/IMG GUIDE
1 series · 4 of 4 positions shown · non-contrast
Comparison: None.

MEDICATIONS:
10 cc 1% lidocaine

COMPLICATIONS:
None immediate.

INDICATION: Symptomatic right sided pleural effusion

EXAM:
US THORACENTESIS ASP PLEURAL SPACE W/IMG GUIDE
TECHNIQUE: Informed written consent was obtained from the patient after a
discussion of the risks, benefits and alternatives to treatment. A
timeout was performed prior to the initiation of the procedure.

[Series 1: us thoracentesis asp pleural space w/img guide · 4 of 4 slices shown]
[im 1/4]
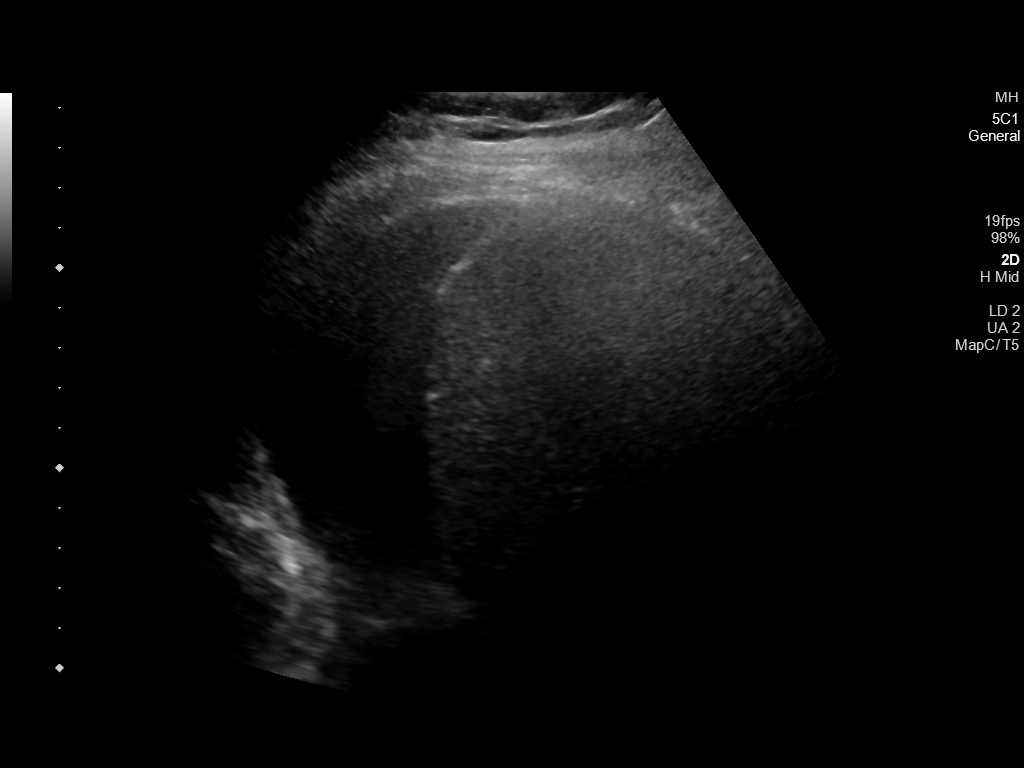
[im 2/4]
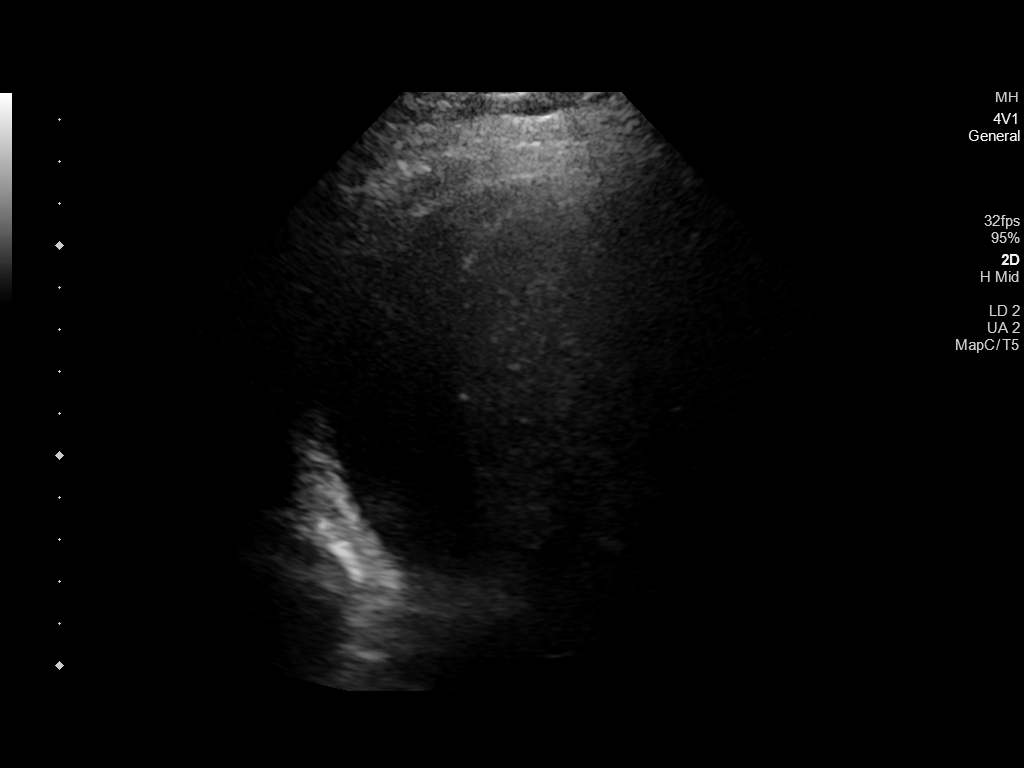
[im 3/4]
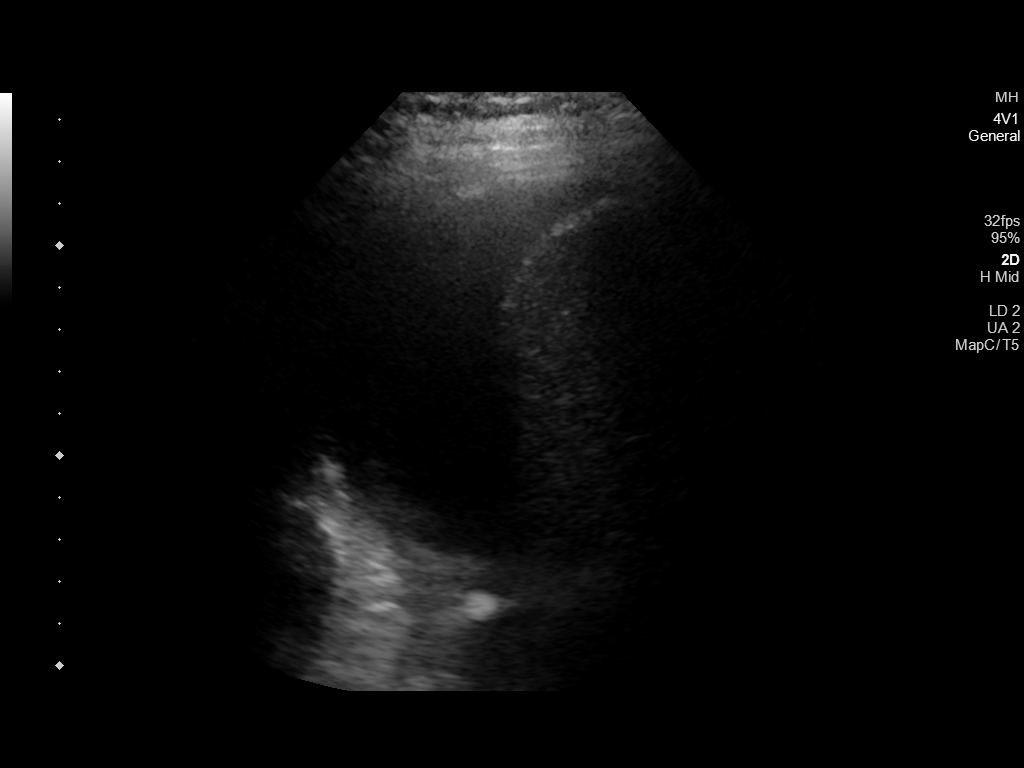
[im 4/4]
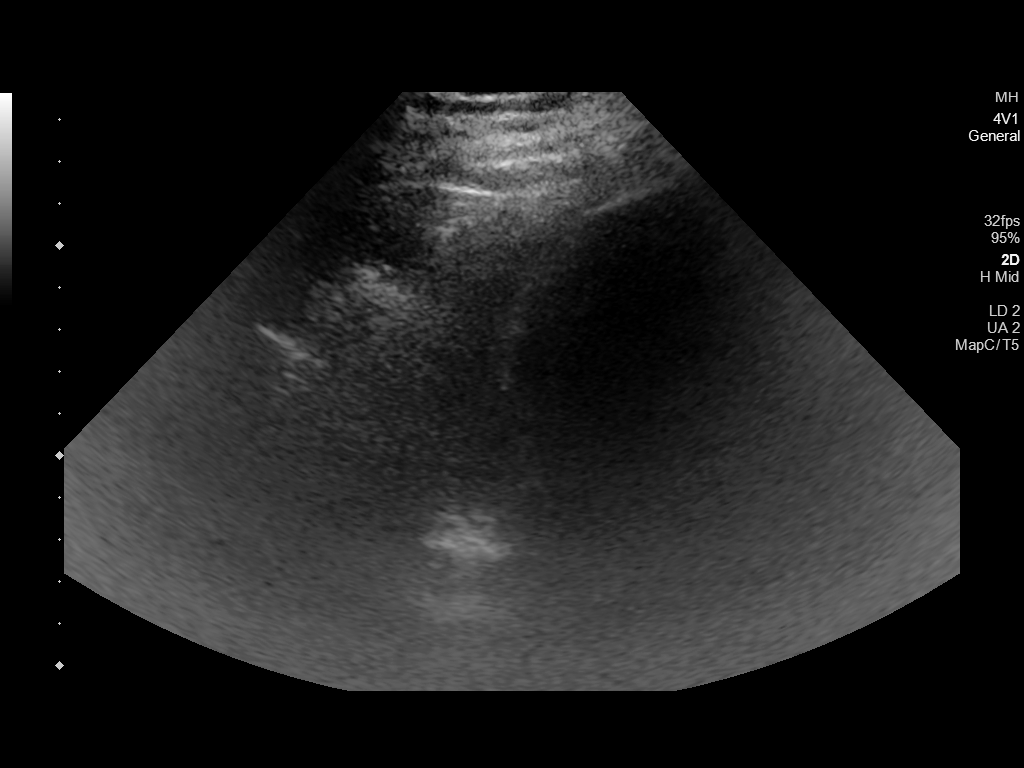

[4 of 4 positions shown; findings below may reference images not displayed]

Initial ultrasound scanning demonstrates a right pleural effusion.
The lower chest was prepped and draped in the usual sterile fashion.
1% lidocaine was used for local anesthesia.

Under direct ultrasound guidance, a 19 gauge, 7-cm, Yueh catheter
was introduced. An ultrasound image was saved for documentation
purposes. The thoracentesis was performed. The catheter was removed
and a dressing was applied. The patient tolerated the procedure well
without immediate post procedural complication. The patient was
escorted to have an upright chest radiograph.
FINDINGS: A total of approximately 400 cc of yellow fluid was removed.
Requested samples were sent to the laboratory.
IMPRESSION: Successful ultrasound-guided right sided thoracentesis yielding 400
cc of pleural fluid.

Read by

Ebadat Tiger

## 2019-11-08 IMAGING — DX DG CHEST 1V
1 series · 1 of 1 positions shown · non-contrast
Comparison: Chest radiograph-12/10/2018; chest CT-12/10/2018

CLINICAL DATA: Post right-sided thoracentesis.

EXAM:
CHEST  1 VIEW

[chest ap]
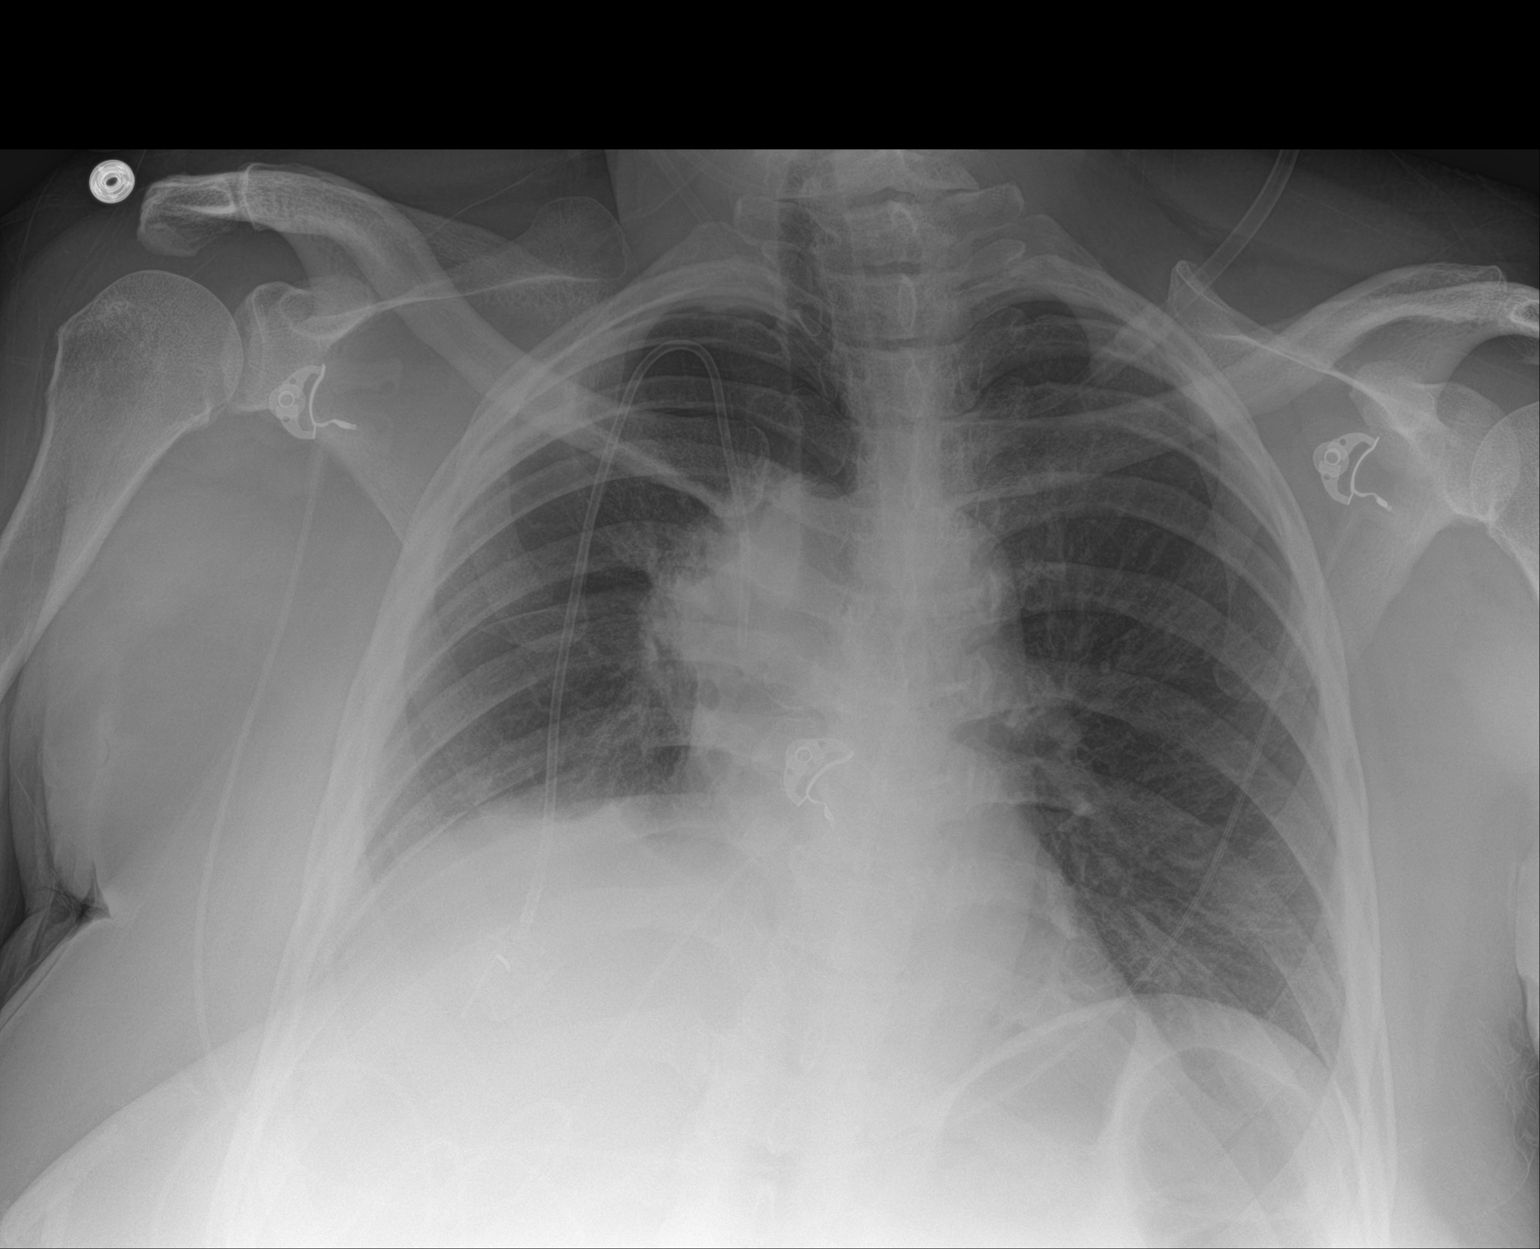

[1 of 1 positions shown; findings below may reference images not displayed]

FINDINGS: Grossly unchanged cardiac silhouette and mediastinal contours with
obscuration of the right hilum secondary to consolidative opacities,
volume loss and architectural distortion as demonstrated on
preceding chest CT.

No change to slight reduction of right-sided pleural effusion post
thoracentesis. No pneumothorax.

The left hemithorax remains well aerated. No new focal airspace
opacities. No evidence of edema. Stable position of support
apparatus. No acute osseous abnormalities.
IMPRESSION: 1. No change to slight reduction of right-sided pleural effusion
post thoracentesis. No pneumothorax.
2. Persistent obscuration of the right hilum secondary to
consolidative opacities, volume loss and architectural distortion.

## 2019-11-09 IMAGING — DX DG CHEST 1V PORT
1 series · 1 of 1 positions shown · non-contrast
Comparison: 12/11/2018

CLINICAL DATA: Respiratory failure, metastatic cancer

EXAM:
PORTABLE CHEST 1 VIEW

[chest ap]
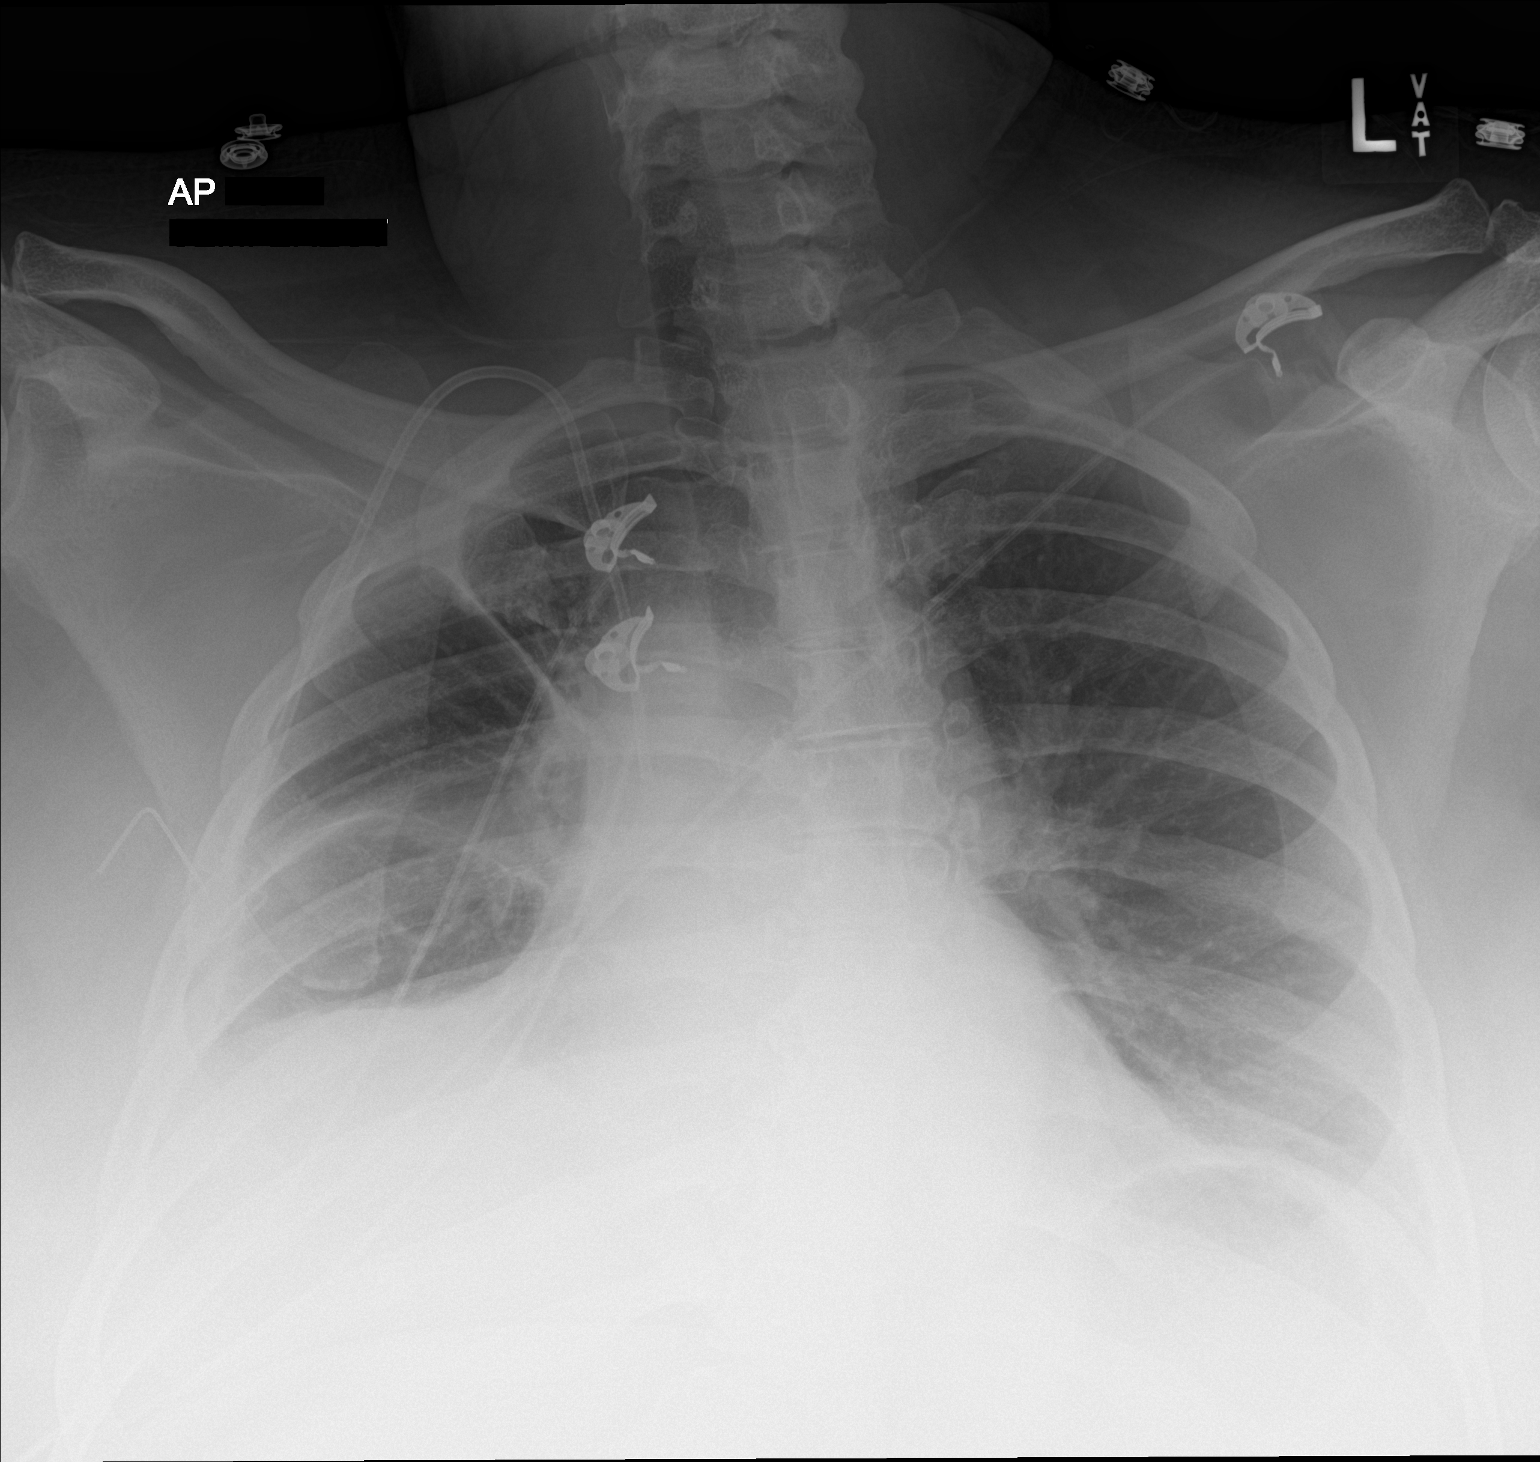

[1 of 1 positions shown; findings below may reference images not displayed]

FINDINGS: No significant change in AP portable examination with layering
moderate right pleural and post treatment appearance of the
suprahilar right lung. No new airspace opacity. Right chest port
catheter.
IMPRESSION: No significant change in AP portable examination with layering
moderate right pleural and post treatment appearance of the
suprahilar right lung. No new airspace opacity.

## 2019-11-29 IMAGING — CT CT ANGIO CHEST
3 of 9 series · 16 of 46 positions shown · IV contrast (omnipaque)
Comparison: CT angiogram chest December 10, 2018; CT abdomen and
pelvis November 29, 2018; chest radiograph January 01, 2019

CLINICAL DATA: Shortness of breath. Stage IV lung carcinoma.
Abdominal pain with nausea

EXAM:
CT ANGIOGRAPHY CHEST
CT ABDOMEN AND PELVIS WITH CONTRAST
TECHNIQUE: Multidetector CT imaging of the chest was performed using the
standard protocol during bolus administration of intravenous
contrast. Multiplanar CT image reconstructions and MIPs were
obtained to evaluate the vascular anatomy. Multidetector CT imaging
of the abdomen and pelvis was performed using the standard protocol
during bolus administration of intravenous contrast.
CONTRAST:  100mL OMNIPAQUE IOHEXOL 350 MG/ML SOLN

[Series 5: axial st · axial · 0.84mm/px · z∈[-625,-210]mm · 12 of 99 slices shown (1 of 2)]
[im 8/99  lung]
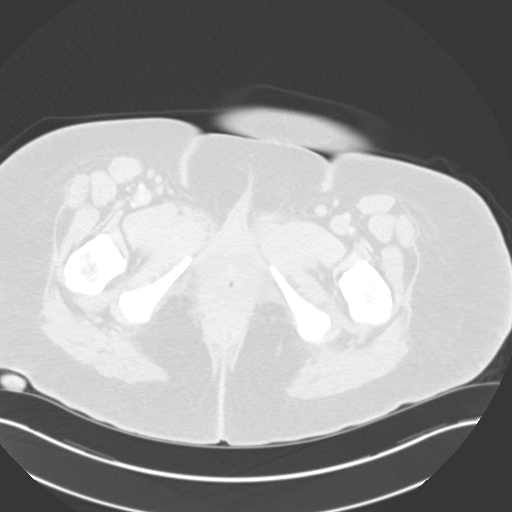
[im 16/99  soft-tissue]
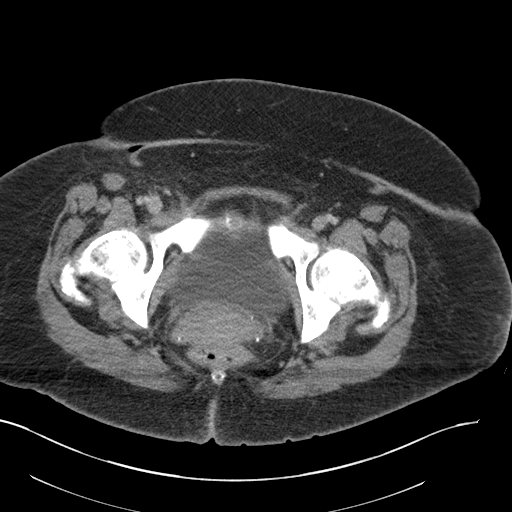
[im 23/99  lung]
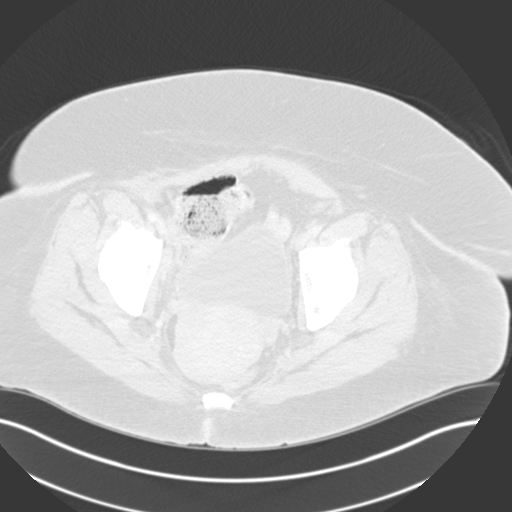
[im 31/99  soft-tissue]
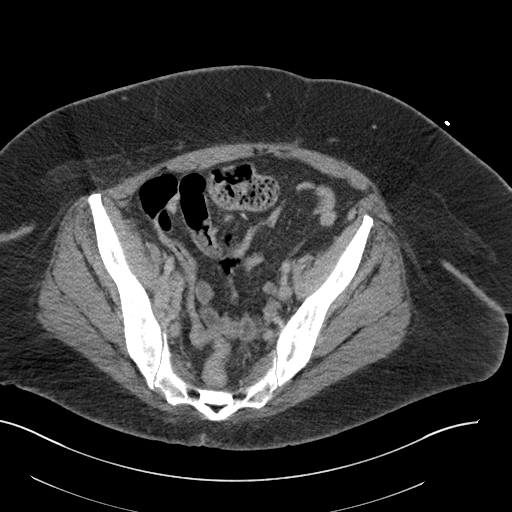
[im 38/99  lung]
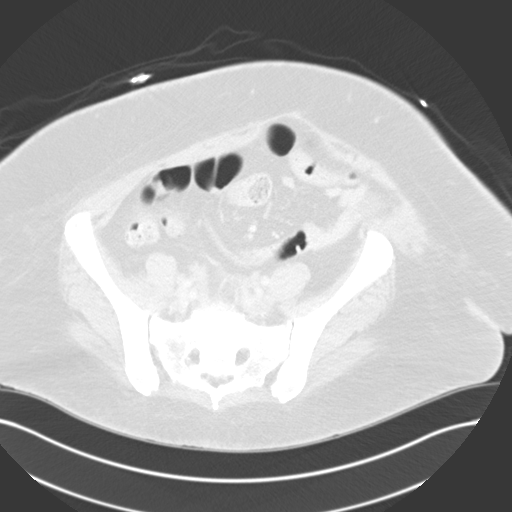
[im 46/99  soft-tissue]
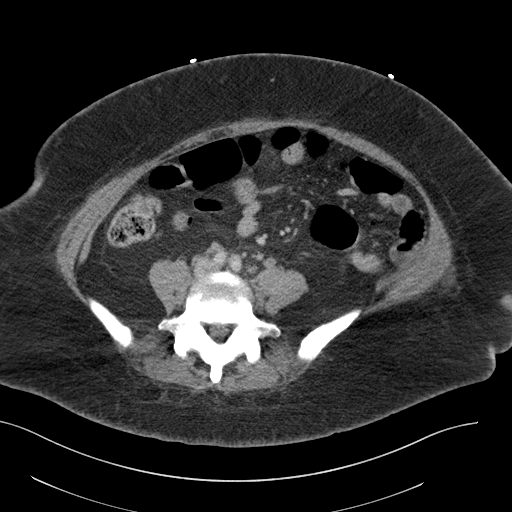
[im 53/99  lung]
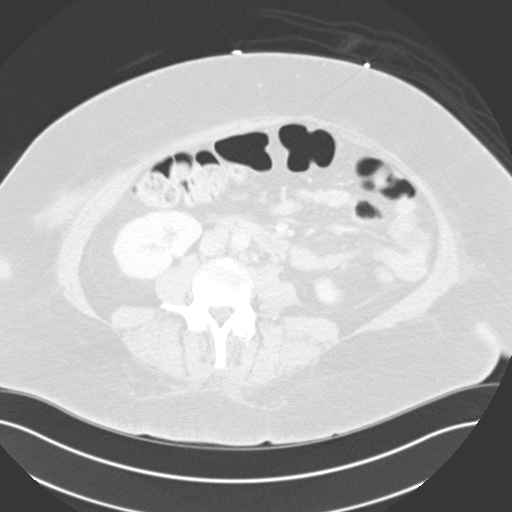
[im 61/99  soft-tissue]
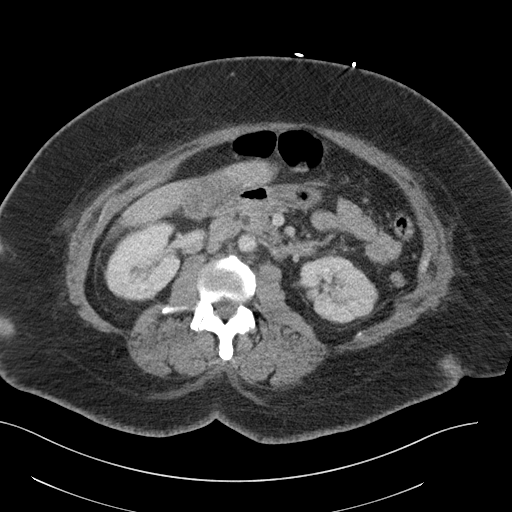
[im 68/99  lung]
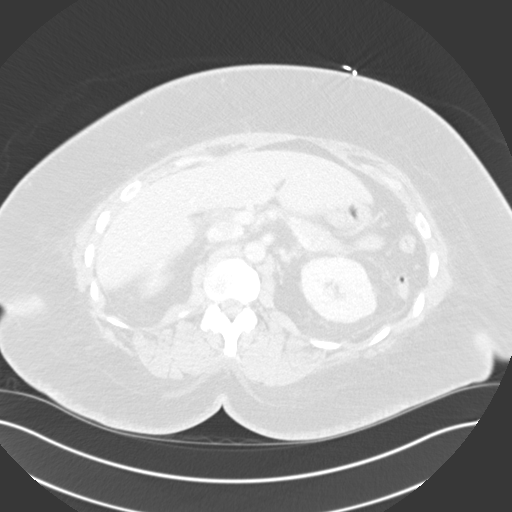
[im 76/99  soft-tissue]
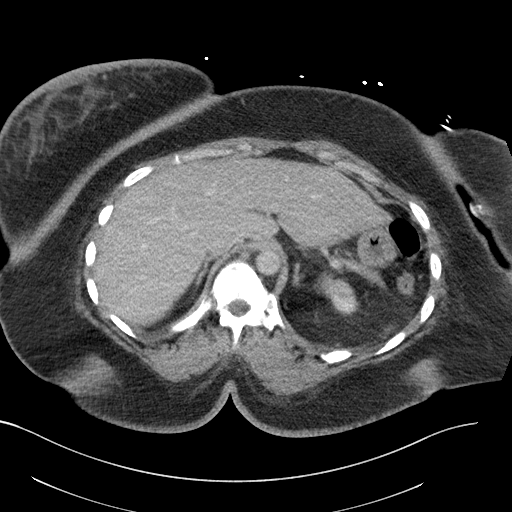
[im 83/99  lung]
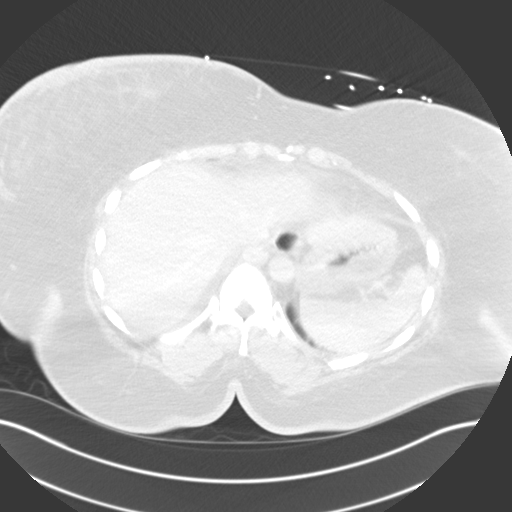
[im 91/99  soft-tissue]
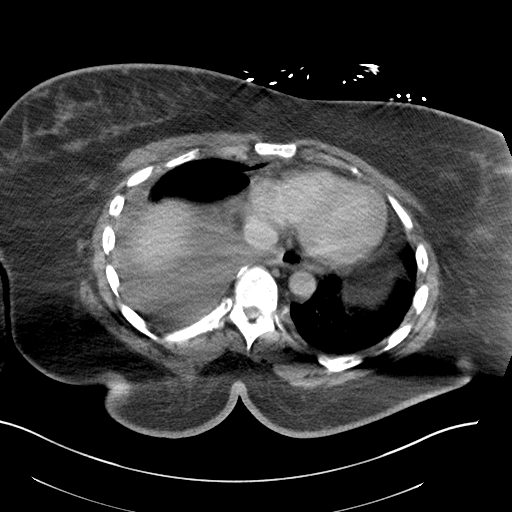

[Series 7: axial st · axial · 0.67mm/px · z∈[-245,-182]mm · 3 of 80 slices shown (2 of 2)]
[im 8/80  lung]
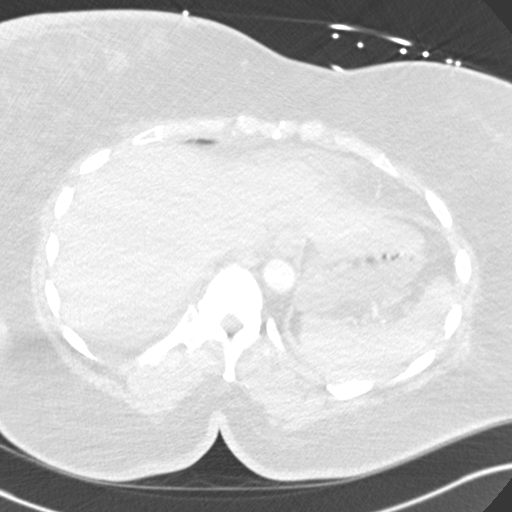
[im 15/80  lung]
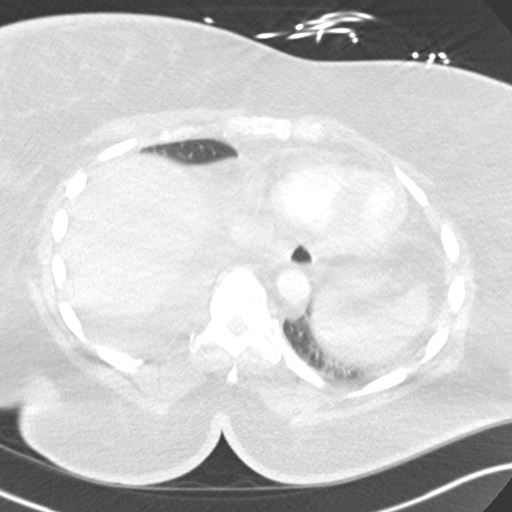
[im 29/80  lung]
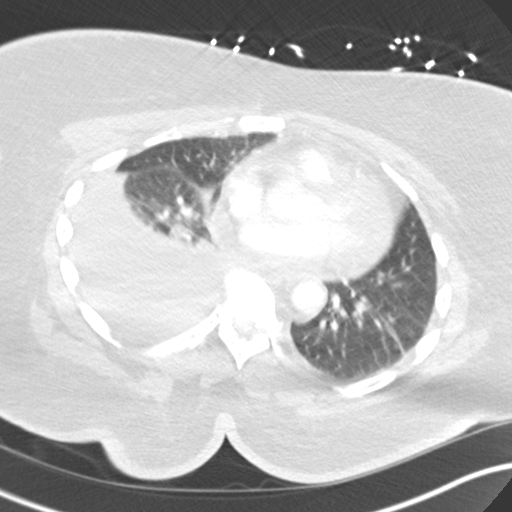

[Series 10: coronal mpr · coronal · 0.47mm/px · 1 of 151 slices shown]
[im 76/151  soft-tissue]
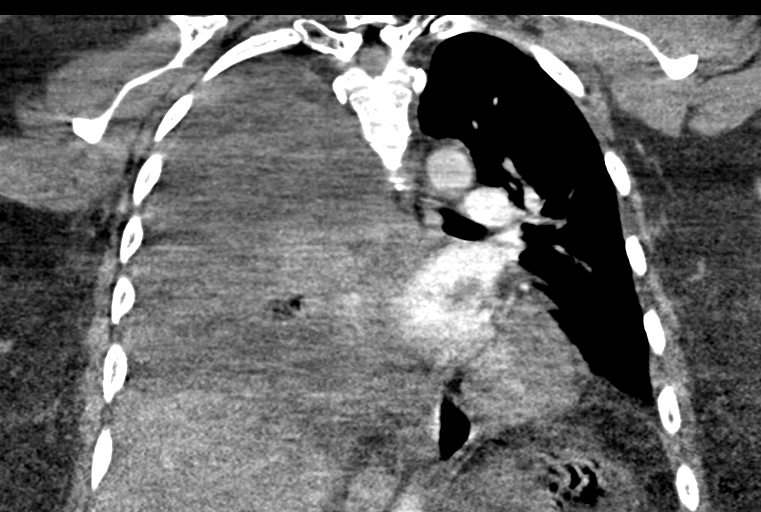

[16 of 46 positions shown; findings below may reference images not displayed]

FINDINGS: CTA CHEST FINDINGS

Cardiovascular: There is no demonstrable pulmonary embolus. There is
no thoracic aortic aneurysm or dissection. The visualized great
vessels appear unremarkable. There is rather minimal pericardial
fluid. There is no pericardial thickening. Port-A-Cath tip is in the
superior vena cava.

Mediastinum/Nodes: There remains extensive axillary adenopathy on
the right. The largest individual lymph node in the right axilla
measures 3.4 x 2.7 cm. There is adenopathy in the right paratracheal
region with the largest lymph node in this region measuring 2.3 x
2.1 cm. There is soft tissue prominence in the hilum which is
inseparable from lung collapse. The appearance is similar to recent
study. No esophageal lesions are evident.

Lungs/Pleura: Sizable pleural effusion on the right remains with
consolidation/compressive atelectasis throughout the right middle
and lower lobes. There is airspace consolidation in the medial
aspect of the right upper lobe, stable. On the left, there are areas
of relatively mild lower lobe atelectatic change. No consolidation
seen on the left.

Musculoskeletal: No blastic or lytic bone lesions are evident. No
chest wall lesions appreciable.

Review of the MIP images confirms the above findings.

CT ABDOMEN and PELVIS FINDINGS

Hepatobiliary: No focal liver lesions are appreciable. The
gallbladder is mildly contracted with gallbladder wall mildly
thickened. Gallstones were better seen on recent CT. The proximal
common bile duct measures 12 mm which is dilated. The common bile
duct tapers distally without mass or calculus evident by
radiography.

Pancreas: There is no appreciable pancreatic mass or inflammatory
focus.

Spleen: No splenic lesions are evident.

Adrenals/Urinary Tract: Adrenals bilaterally appear normal. Kidneys
bilaterally show no evident mass or hydronephrosis on either side.
Urinary bladder is midline with wall thickness within normal limits.

Stomach/Bowel: There is no appreciable bowel wall or mesenteric
thickening. There is no evident bowel obstruction. No free air or
portal venous air. The terminal ileum appears unremarkable.

Vascular/Lymphatic: There is no abdominal aortic aneurysm. There is
aortic atherosclerosis.

There are mildly prominent lymph nodes in the pelvis, stable. The
largest lymph node is in the right internal iliac node chain
measuring 2.4 x 1.7 cm. There are multiple retroperitoneal lymph
nodes, similar to prior study with the largest of these
retroperitoneal lymph nodes measuring 0.9 cm in short axis diameter.
No new lymph node prominence evident.

Reproductive: Uterus is retroverted. Leiomyomatous changes in the
uterus are grossly stable compared to the previous study. No
extrauterine pelvic mass is evident.

Other: Appendix not seen. No periappendiceal region inflammation. No
abscess or ascites evident in the abdomen or pelvis.

Musculoskeletal: No blastic or lytic bone lesions. No intramuscular
or abdominal wall lesions.

Review of the MIP images confirms the above findings.
IMPRESSION: CT angiogram chest:

1. No demonstrable pulmonary embolus. No thoracic aortic aneurysm or
dissection.

2. Sizable right pleural effusion with consolidation and compressive
atelectasis throughout much of the right lung. Essentially complete
collapse of the right middle and lower lobes remains. Consolidation
medial aspect of the right upper lobe is present.

3. Right paratracheal and right axillary adenopathy, similar to
recent study.

4.  Left lower lobe atelectatic change.

CT abdomen and pelvis:

1. Areas of lymph node prominence, primarily in the right internal
iliac chain, stable and likely of neoplastic etiology given the
known lung carcinoma. No new adenopathy.

2. Gallbladder wall is thickened. Known gallstones are better seen
on recent CT. There is mild biliary duct dilatation. Correlation
with ultrasound to better assess the gallbladder advised given the
degree of wall thickening in this area.

3.  Leiomyomatous uterus.

4. No bowel obstruction. No abscess in the abdomen or pelvis. No
periappendiceal region inflammation.

5. No renal or ureteral calculi. No hydronephrosis. Urinary bladder
wall thickness within normal limits.

## 2019-11-29 IMAGING — MR MR MRCP
6 of 12 series · 21 of 48 positions shown · non-contrast
Comparison: 01/01/2019 abdominal ultrasound. Abdominopelvic CT of
01/01/2019.

CLINICAL DATA: Abdominal pain for 3 months. Nausea. Constipation.
Shortness of breath. Stage IV lung cancer. Gallstones.

EXAM:
MRI ABDOMEN WITHOUT CONTRAST  (INCLUDING MRCP)
TECHNIQUE: Multiplanar multisequence MR imaging of the abdomen was performed.
Heavily T2-weighted images of the biliary and pancreatic ducts were
obtained, and three-dimensional MRCP images were rendered by post
processing.

[Series 3: T2 fat-sat · axial · 5.0mm · 0.94mm/px · z∈[-141,+124]mm · 4 of 54 slices shown]
[im 1/54]
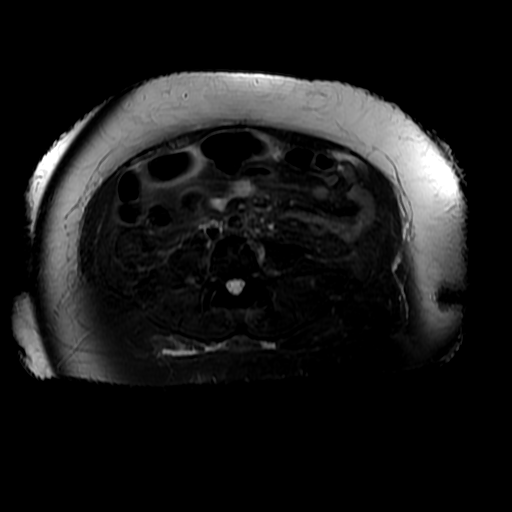
[im 18/54]
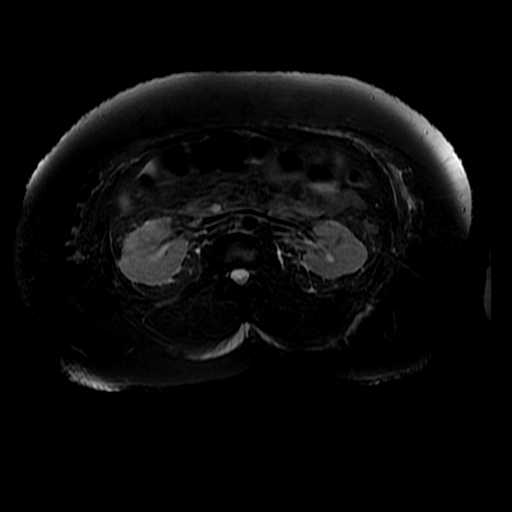
[im 36/54]
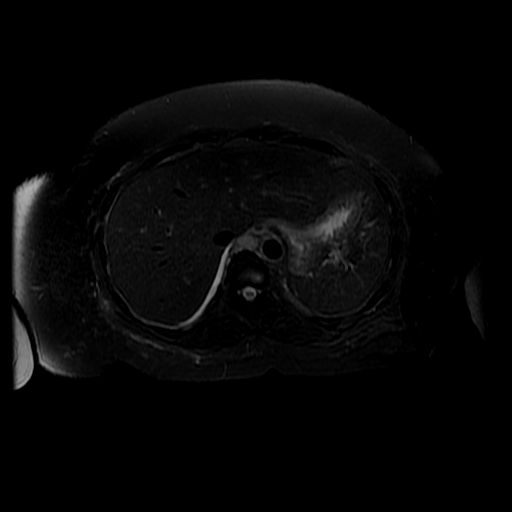
[im 54/54]
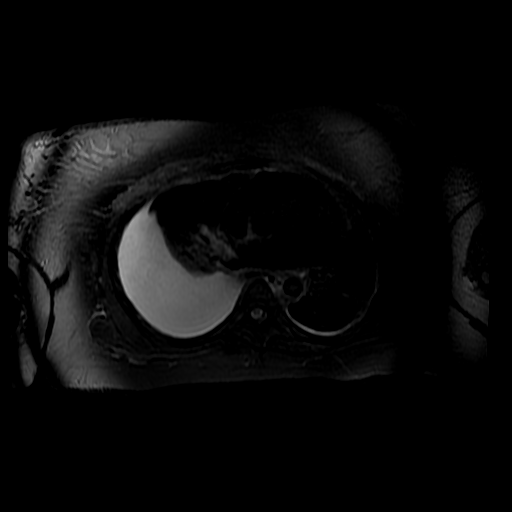

[Series 4: DWI b500 · axial · 6.0mm · 1.88mm/px · z∈[-158,+138]mm · 4 of 78 slices shown]
[im 1/78]
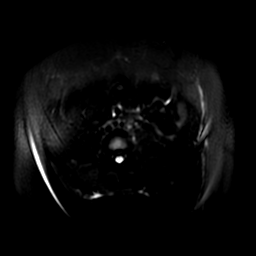
[im 26/78]
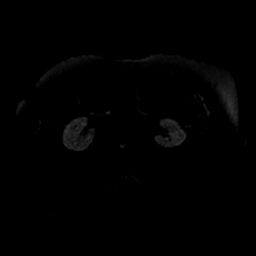
[im 52/78]
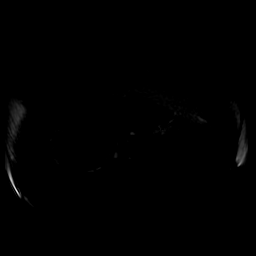
[im 78/78]
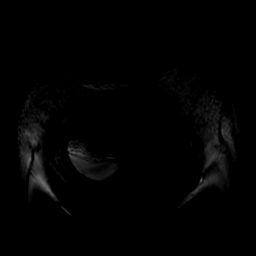

[Series 6: MRCP · coronal · 2.0mm · 0.70mm/px · 3 of 62 slices shown]
[im 1/62]
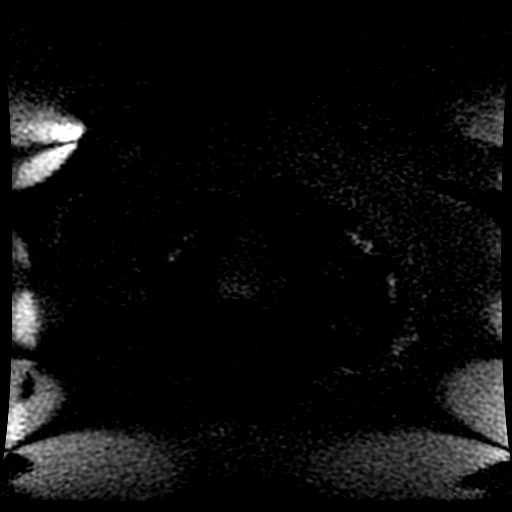
[im 31/62]
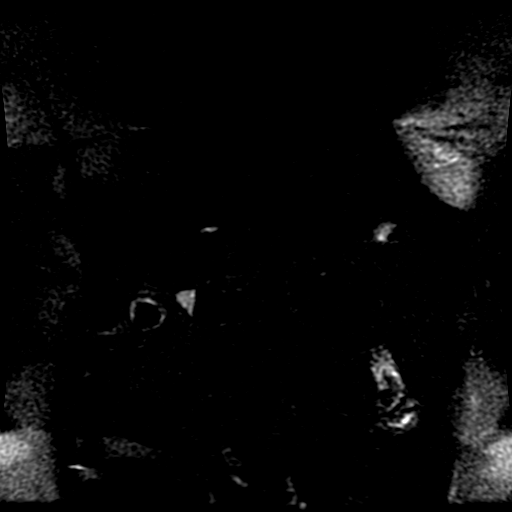
[im 62/62]
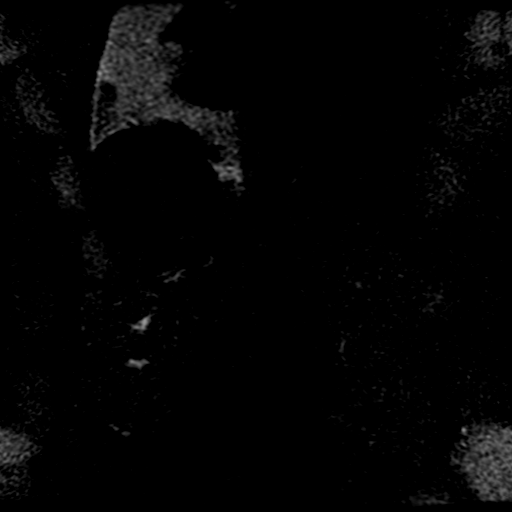

[Series 7: bSSFP fat-sat · coronal · 5.0mm · 0.86mm/px · 3 of 49 slices shown]
[im 1/49]
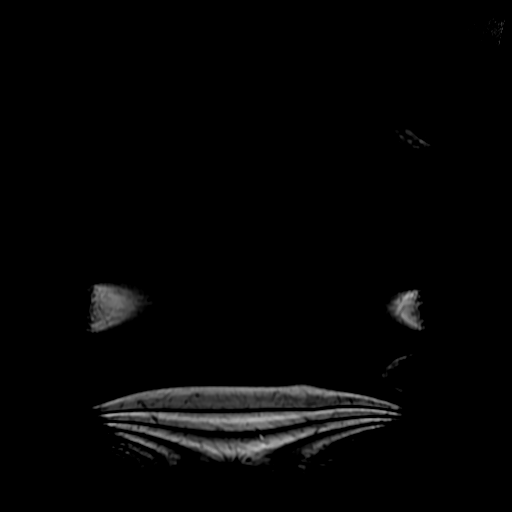
[im 25/49]
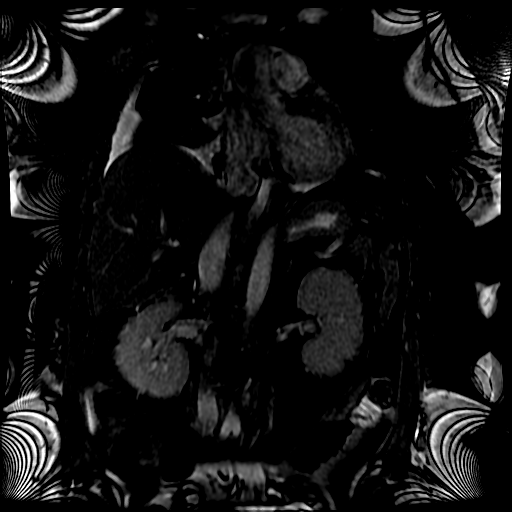
[im 49/49]
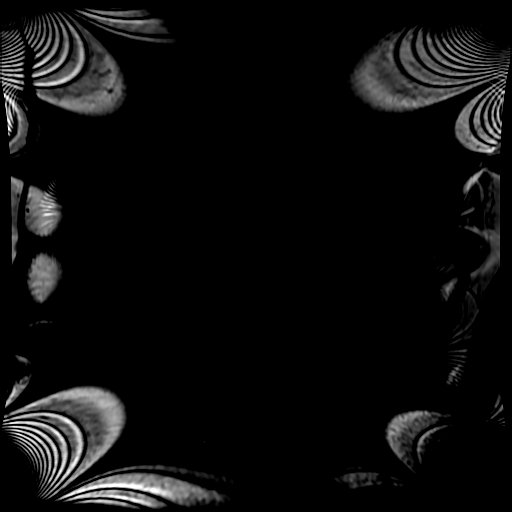

[Series 8: T2 · axial · 5.0mm · 0.94mm/px · z∈[-160,+140]mm · 3 of 61 slices shown]
[im 1/61]
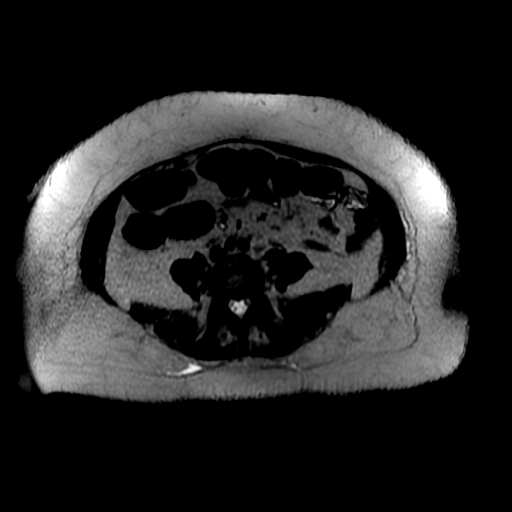
[im 31/61]
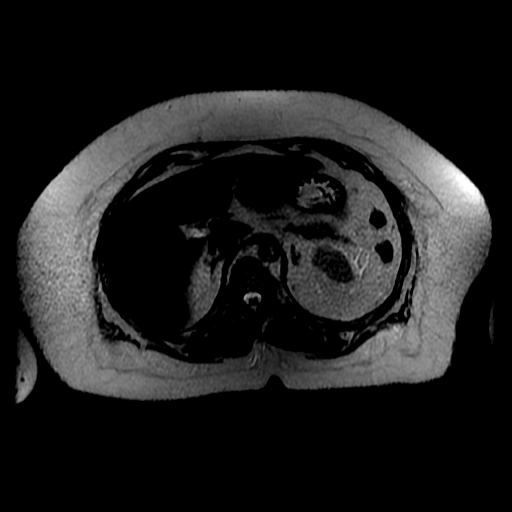
[im 61/61]
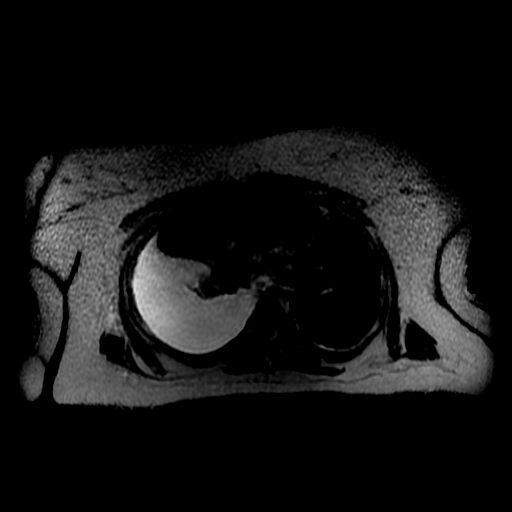

[Series 9: ax dualecho bh · axial · 5.0mm · 0.94mm/px · z∈[-160,+20]mm · 4 of 122 slices shown]
[im 1/122]
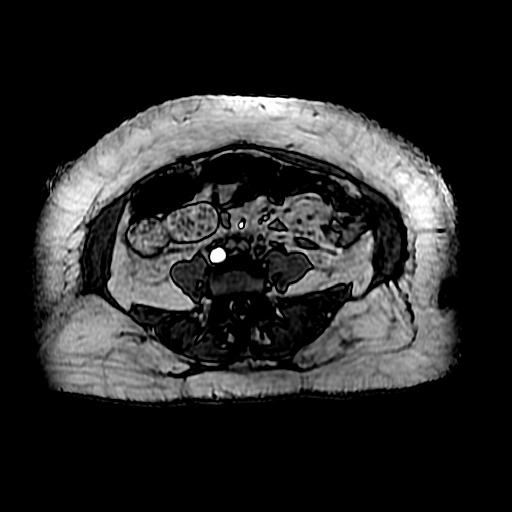
[im 25/122]
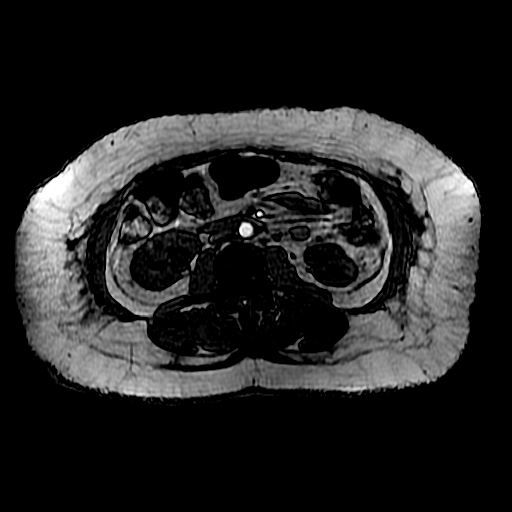
[im 49/122]
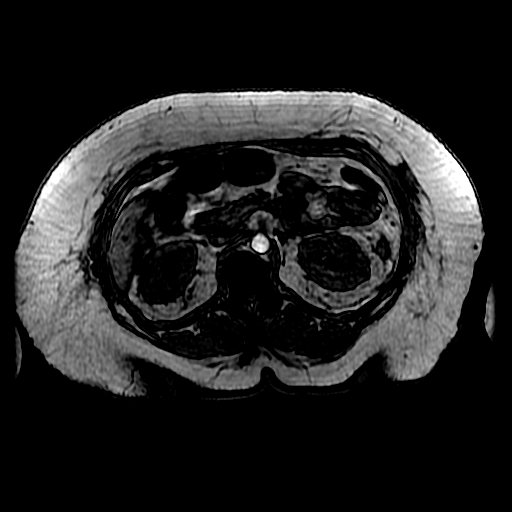
[im 73/122]
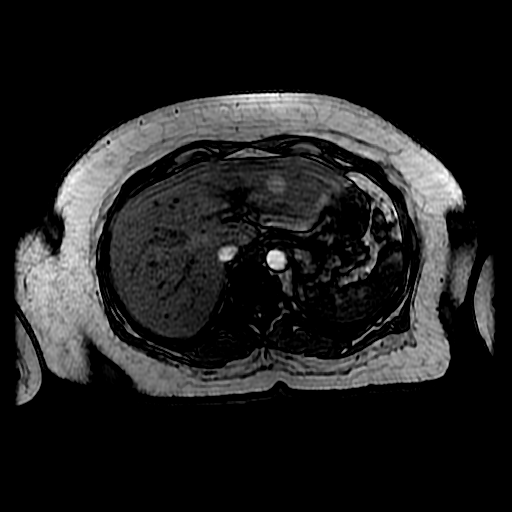

[21 of 48 positions shown; findings below may reference images not displayed]

FINDINGS: Mild limitations secondary to patient body habitus.

Lower chest: Moderate right pleural effusion.  Normal heart size.

Hepatobiliary: Normal noncontrast appearance of the liver. There is
borderline to minimal intrahepatic biliary duct dilatation,
including on [DATE]. The gallbladder is stone filled, with stones up
to 1.8 cm.

The common duct is dilated, including at maximally 1.5 cm on 83/5.
Tapers gradually in the region of the pancreatic head. No
obstructive stone or mass identified.

Pancreas:  Normal, without mass or ductal dilatation.

Spleen:  Normal in size, without focal abnormality.

Adrenals/Urinary Tract: Normal adrenal glands. Normal kidneys,
without hydronephrosis.

Stomach/Bowel:  Normal stomach and abdominal bowel loops.

Vascular/Lymphatic:  Normal aortic caliber.

Prominent abdominal nodes within the retroperitoneum. None are
pathologic by size criteria. Retrocrural adenopathy, including at
1.1 cm on [DATE], suspicious.

Other:  No ascites.

Musculoskeletal: No acute osseous abnormality.
IMPRESSION: 1. Patient body habitus degradation.
2. Cholelithiasis with common duct dilatation. No cause identified.
Most likely within normal variation, given today's normal bilirubin
level.
3. Retrocrural adenopathy, suspicious for nodal metastasis in this
patient with a history of lung cancer.

## 2019-11-29 IMAGING — CR DG CHEST 2V
2 series · 2 of 2 positions shown · non-contrast
Comparison: Twenty days ago

CLINICAL DATA: Shortness of breath

EXAM:
CHEST - 2 VIEW

[w chest lat]
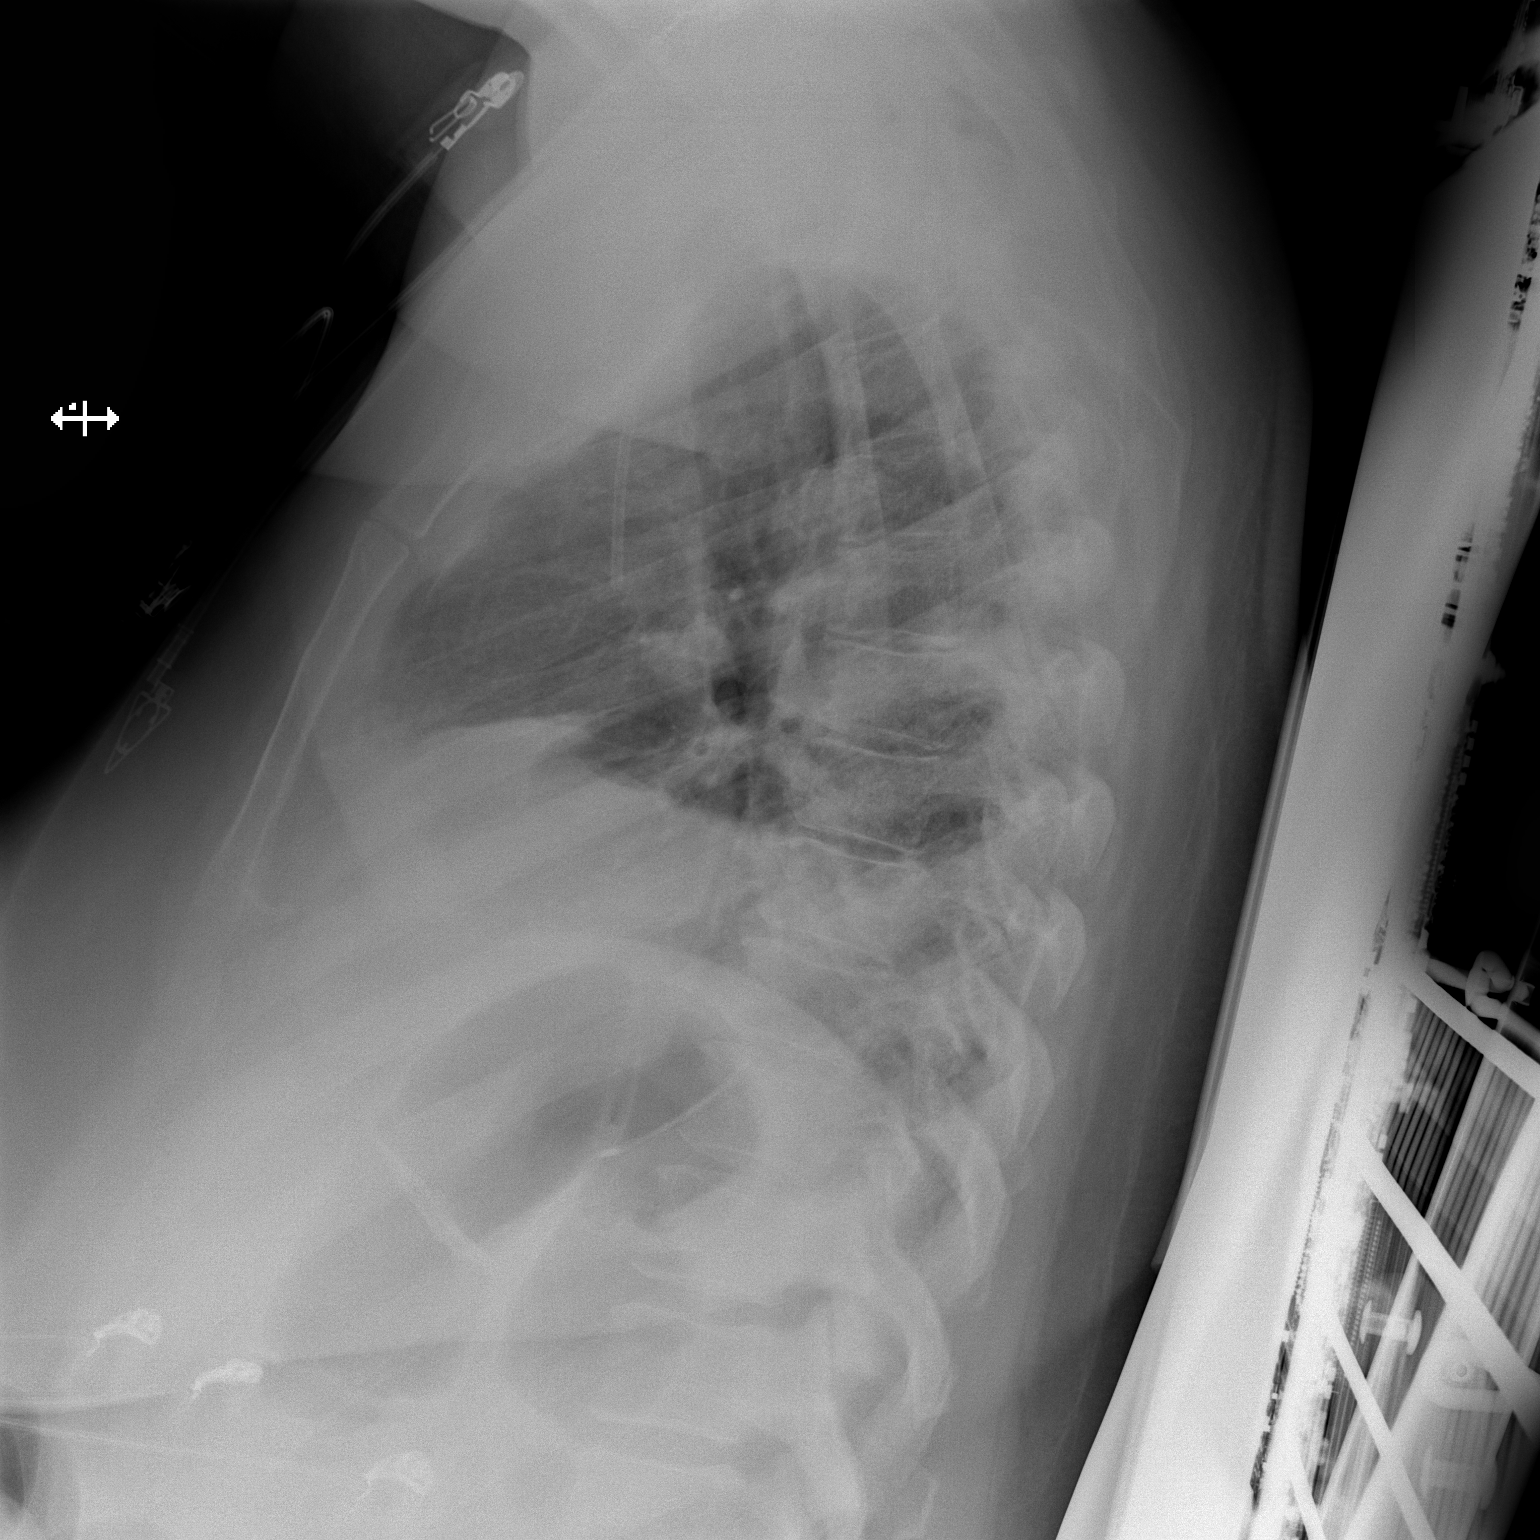

[x chest ap]
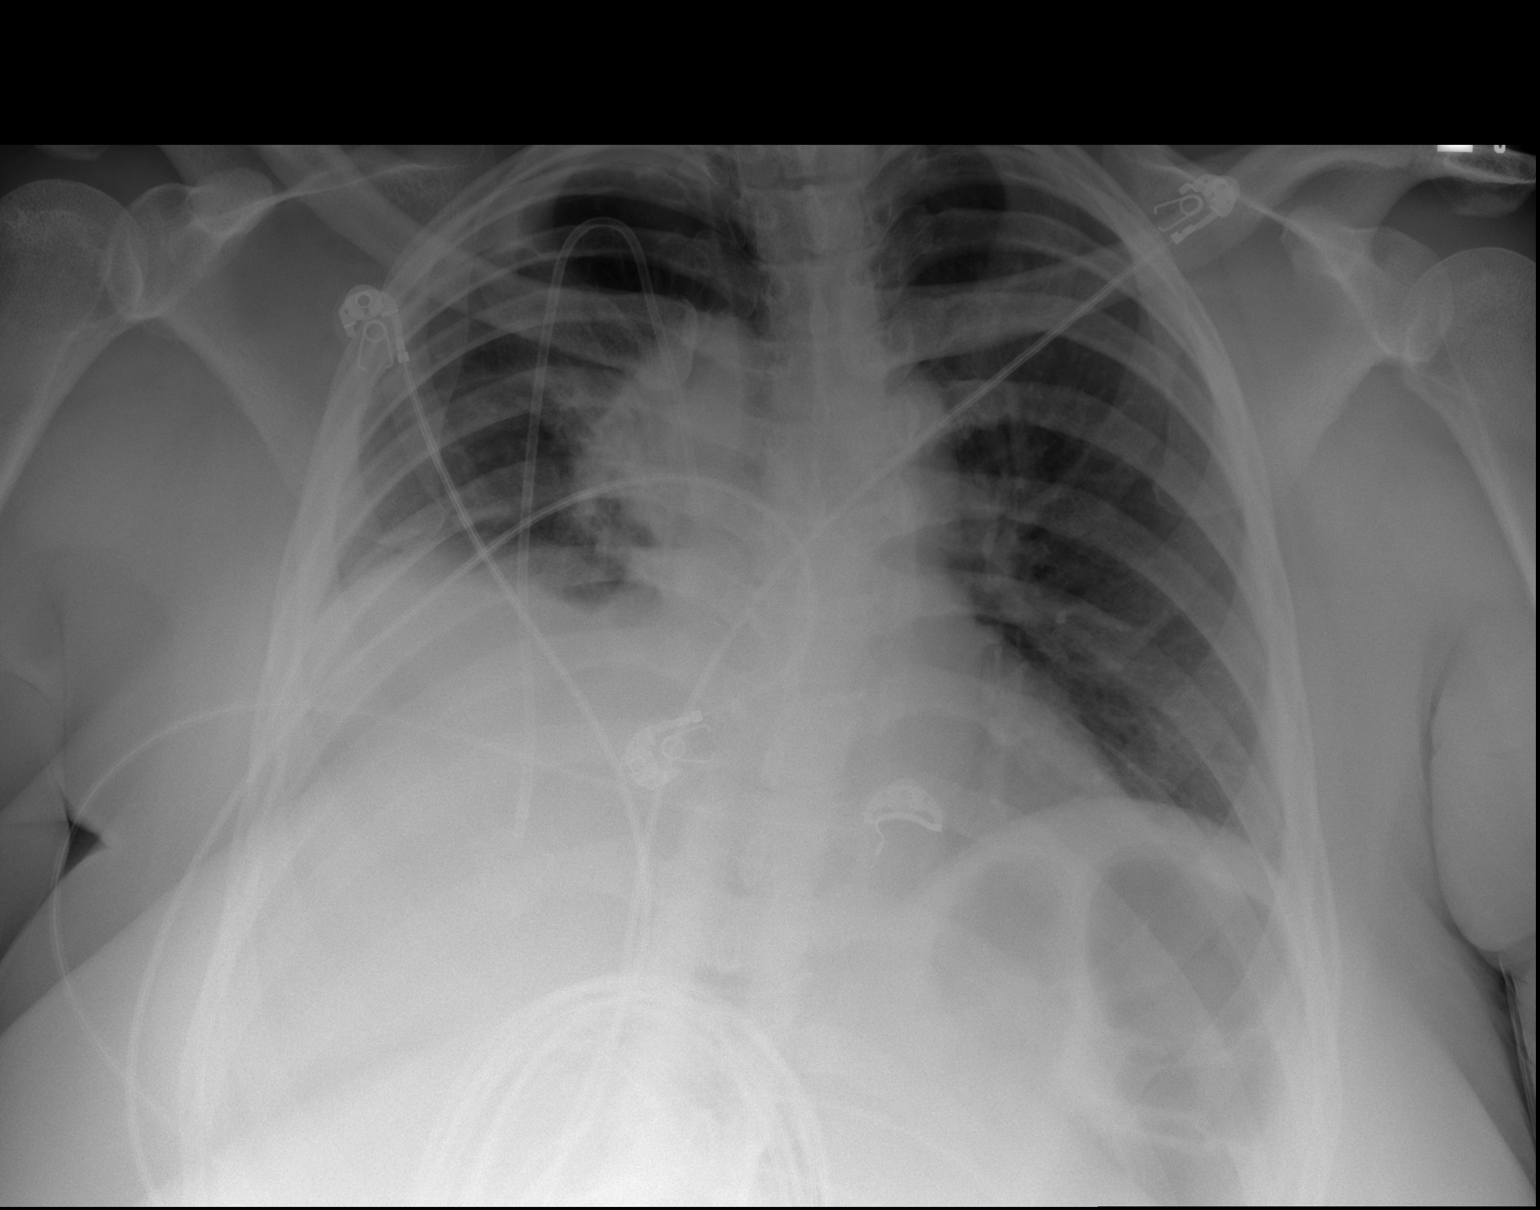

[2 of 2 positions shown; findings below may reference images not displayed]

FINDINGS: Right perihilar opacity (likely radiation fibrosis by CT) with
volume loss and small to moderate pleural effusion. Pleural fluid
has increased from prior. The left lung remains clear. Normal heart
size. Port on the right with tip at the SVC.
IMPRESSION: Small to moderate right pleural effusion with increase from 3 weeks
ago.

## 2019-11-29 IMAGING — NM NM HEPATOBILIARY IMAGE, INC GB
2 series · 12 of 12 positions shown · non-contrast
Comparison: None

Correlation: Ultrasound abdomen 01/01/2019, CT abdomen and pelvis
01/01/2019

CLINICAL DATA: Cholelithiasis, RIGHT upper quadrant pain

EXAM:
NUCLEAR MEDICINE HEPATOBILIARY IMAGING
TECHNIQUE: Sequential images of the abdomen were obtained [DATE] minutes
following intravenous administration of radiopharmaceutical.
RADIOPHARMACEUTICALS:  5.5 mCi Rc-PPm  Choletec IV

[Series 1: biliary · 3.25mm/px · 6 of 60 frames shown]
[frame 6/60]
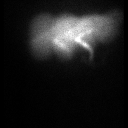
[frame 16/60]
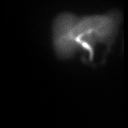
[frame 26/60]
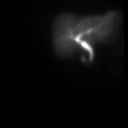
[frame 36/60]
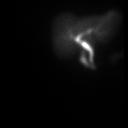
[frame 46/60]
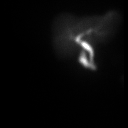
[frame 56/60]
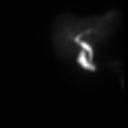

[Series 2: anterior · 3.25mm/px · 6 of 30 frames shown]
[frame 3/30]
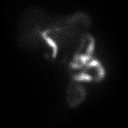
[frame 8/30]
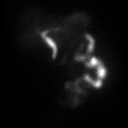
[frame 13/30]
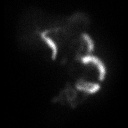
[frame 18/30]
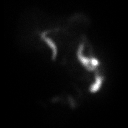
[frame 23/30]
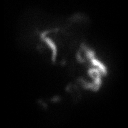
[frame 28/30]
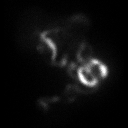

[12 of 12 positions shown; findings below may reference images not displayed]

FINDINGS: Prompt tracer extraction from bloodstream indicating normal
hepatocellular function.

Prompt excretion of tracer into biliary tree.

Small bowel visualized at 11 minutes.

At 1 hour gallbladder had not visualized.

Patient then received 4 mg of morphine sulfate IV and imaging was
continued for 30 minutes.

Faint visualization of the gallbladder following morphine
administration.

Gallbladder was contracted on prior CT and ultrasound exams exam
with mild wall thickening.

Delayed visualization of the gallbladder may be seen in patients
with chronic cholecystitis.
IMPRESSION: Delayed visualization of the gallbladder, seen only following
morphine augmentation, raising question of chronic cholecystitis.

Gallbladder was contracted with a thickened wall on prior CT and
ultrasound exams.

Patent CBD.

## 2019-11-29 IMAGING — CT CT ABD-PELV W/ CM
3 of 10 series · 9 of 46 positions shown, 15 images · IV contrast (omnipaque)
Comparison: CT angiogram chest December 10, 2018; CT abdomen and
pelvis November 29, 2018; chest radiograph January 01, 2019

CLINICAL DATA: Shortness of breath. Stage IV lung carcinoma.
Abdominal pain with nausea

EXAM:
CT ANGIOGRAPHY CHEST
CT ABDOMEN AND PELVIS WITH CONTRAST
TECHNIQUE: Multidetector CT imaging of the chest was performed using the
standard protocol during bolus administration of intravenous
contrast. Multiplanar CT image reconstructions and MIPs were
obtained to evaluate the vascular anatomy. Multidetector CT imaging
of the abdomen and pelvis was performed using the standard protocol
during bolus administration of intravenous contrast.
CONTRAST:  100mL OMNIPAQUE IOHEXOL 350 MG/ML SOLN

[Series 5: axial st · axial · 0.84mm/px · z∈[-565,-270]mm · 4 of 99 slices shown, 9 images]
[im 20/99  soft-tissue]
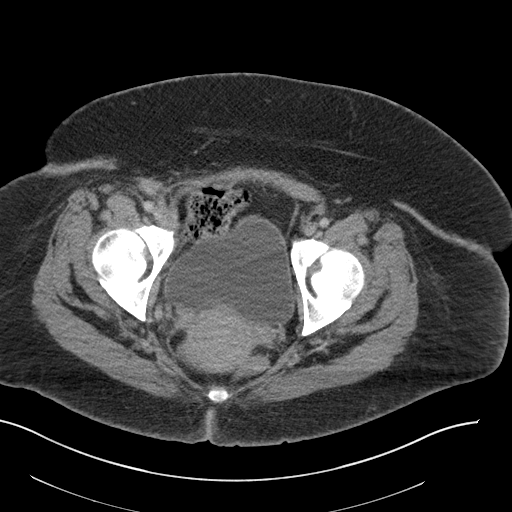
[im 20/99  lung]
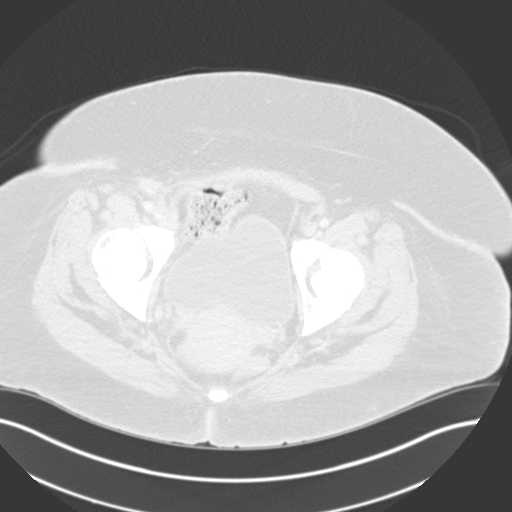
[im 20/99  bone]
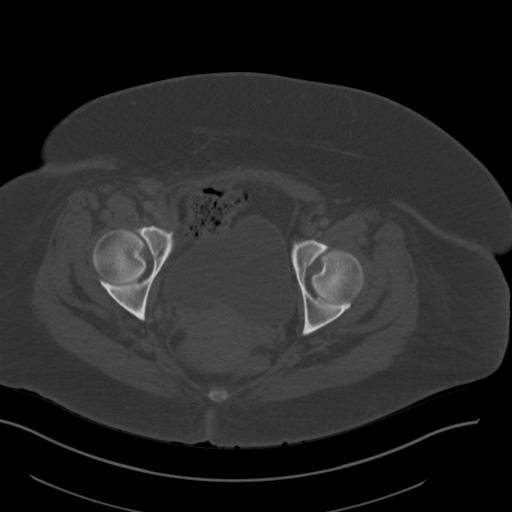
[im 40/99  soft-tissue]
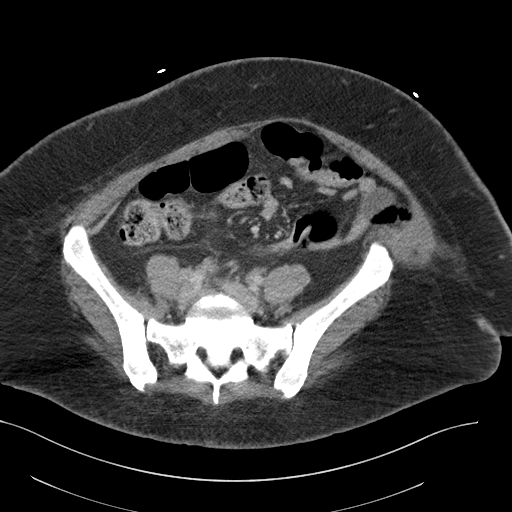
[im 40/99  lung]
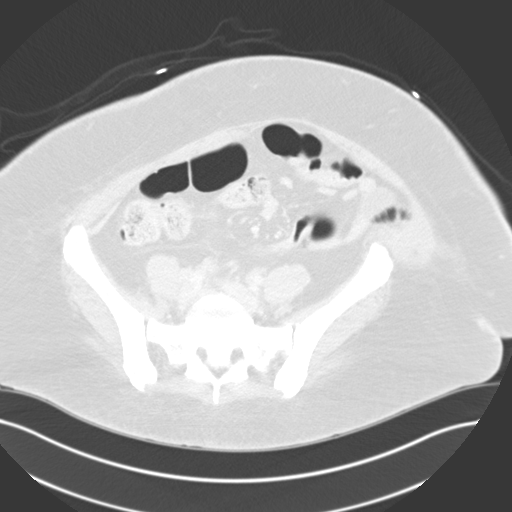
[im 59/99  soft-tissue]
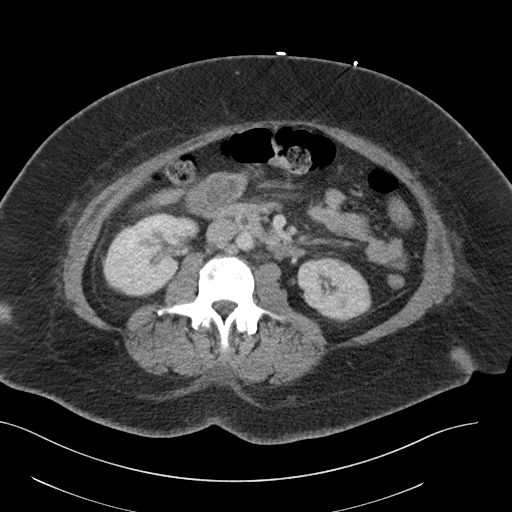
[im 59/99  lung]
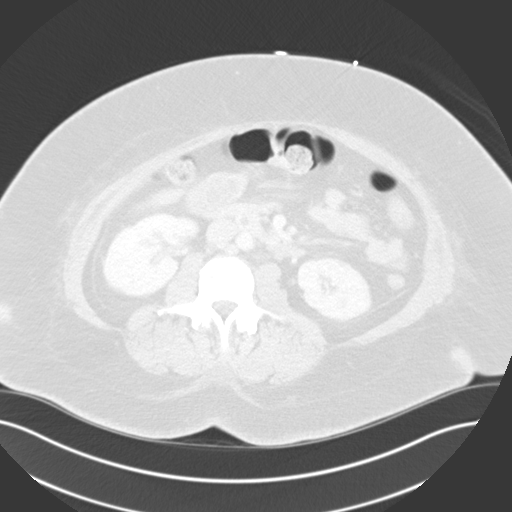
[im 79/99  soft-tissue]
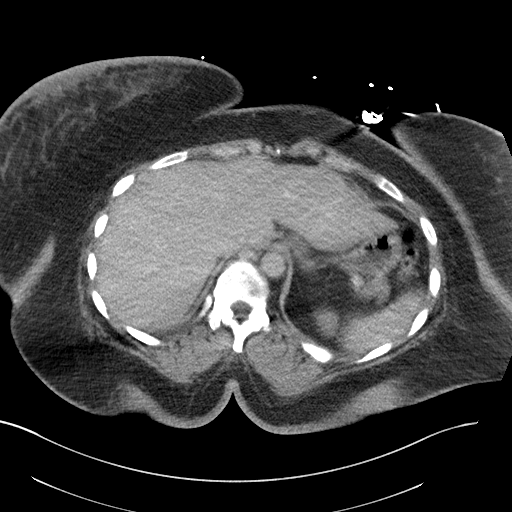
[im 79/99  lung]
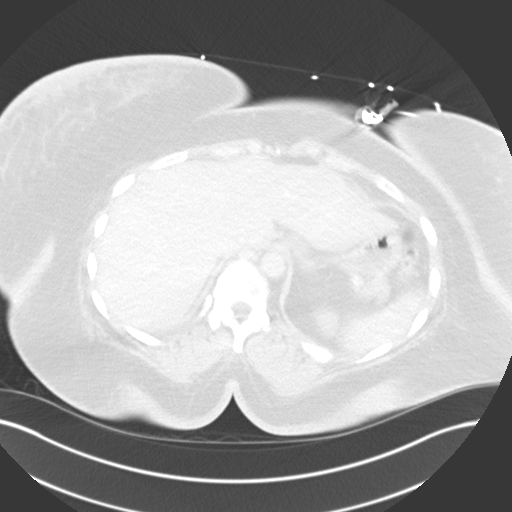

[Series 8: thins · axial · 0.67mm/px · z∈[-254,-164]mm · 4 of 240 slices shown]
[im 15/240  soft-tissue]
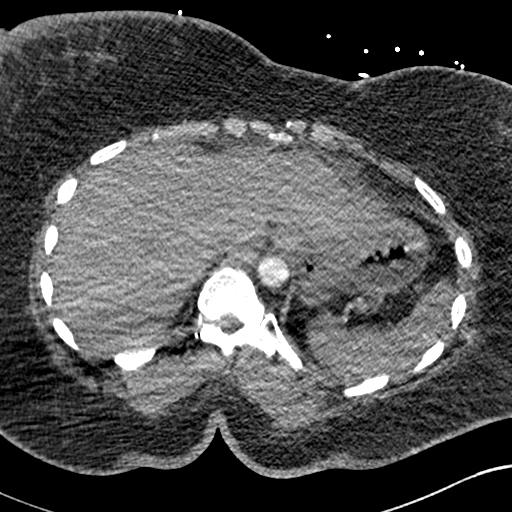
[im 45/240  soft-tissue]
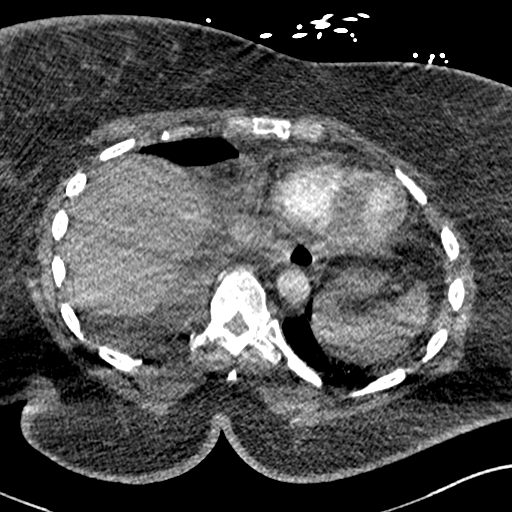
[im 75/240  soft-tissue]
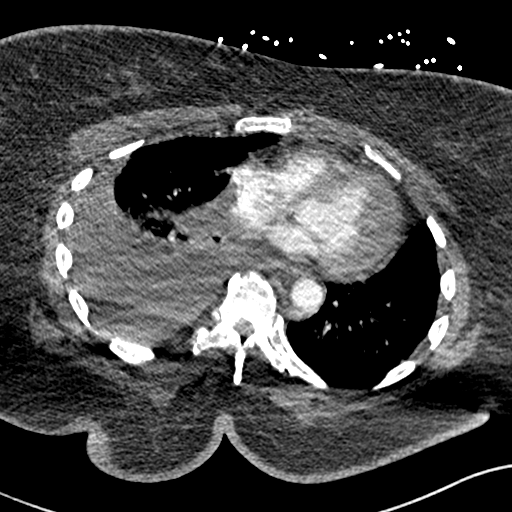
[im 105/240  soft-tissue]
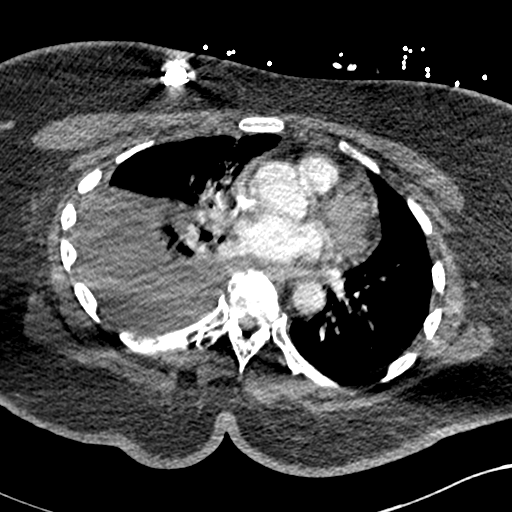

[Series 10: coronal mpr · coronal · 0.47mm/px · 1 of 151 slices shown, 2 images]
[im 76/151  soft-tissue]
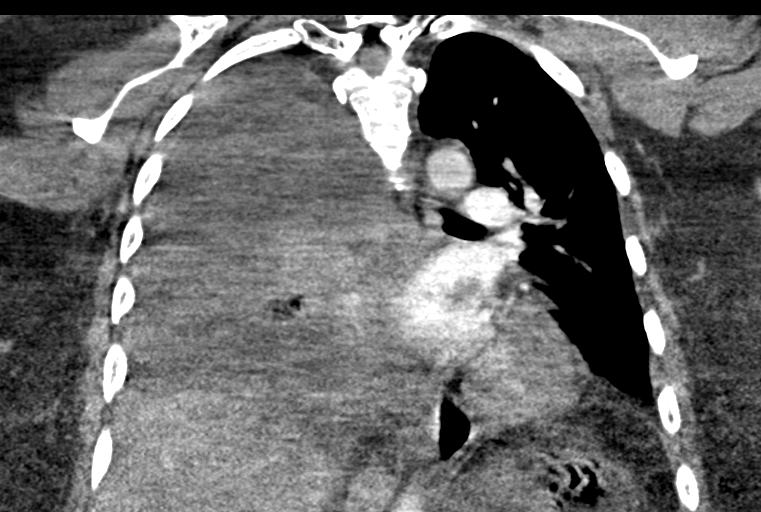
[im 76/151  bone]
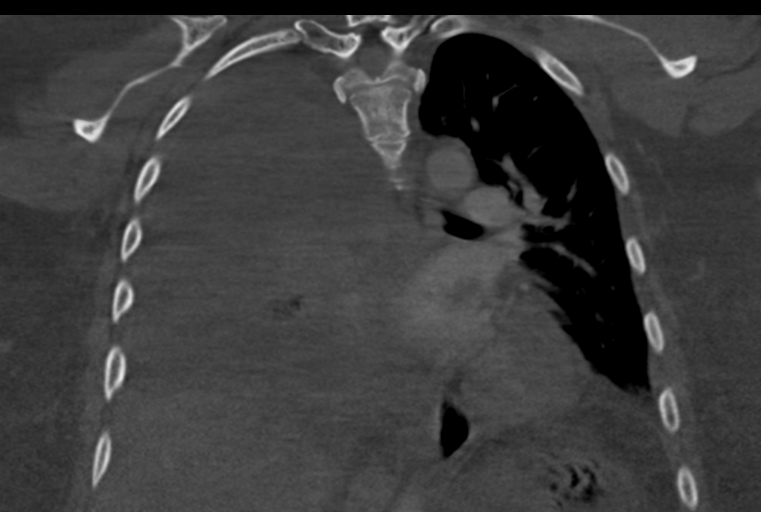

[9 of 46 positions shown; findings below may reference images not displayed]

FINDINGS: CTA CHEST FINDINGS

Cardiovascular: There is no demonstrable pulmonary embolus. There is
no thoracic aortic aneurysm or dissection. The visualized great
vessels appear unremarkable. There is rather minimal pericardial
fluid. There is no pericardial thickening. Port-A-Cath tip is in the
superior vena cava.

Mediastinum/Nodes: There remains extensive axillary adenopathy on
the right. The largest individual lymph node in the right axilla
measures 3.4 x 2.7 cm. There is adenopathy in the right paratracheal
region with the largest lymph node in this region measuring 2.3 x
2.1 cm. There is soft tissue prominence in the hilum which is
inseparable from lung collapse. The appearance is similar to recent
study. No esophageal lesions are evident.

Lungs/Pleura: Sizable pleural effusion on the right remains with
consolidation/compressive atelectasis throughout the right middle
and lower lobes. There is airspace consolidation in the medial
aspect of the right upper lobe, stable. On the left, there are areas
of relatively mild lower lobe atelectatic change. No consolidation
seen on the left.

Musculoskeletal: No blastic or lytic bone lesions are evident. No
chest wall lesions appreciable.

Review of the MIP images confirms the above findings.

CT ABDOMEN and PELVIS FINDINGS

Hepatobiliary: No focal liver lesions are appreciable. The
gallbladder is mildly contracted with gallbladder wall mildly
thickened. Gallstones were better seen on recent CT. The proximal
common bile duct measures 12 mm which is dilated. The common bile
duct tapers distally without mass or calculus evident by
radiography.

Pancreas: There is no appreciable pancreatic mass or inflammatory
focus.

Spleen: No splenic lesions are evident.

Adrenals/Urinary Tract: Adrenals bilaterally appear normal. Kidneys
bilaterally show no evident mass or hydronephrosis on either side.
Urinary bladder is midline with wall thickness within normal limits.

Stomach/Bowel: There is no appreciable bowel wall or mesenteric
thickening. There is no evident bowel obstruction. No free air or
portal venous air. The terminal ileum appears unremarkable.

Vascular/Lymphatic: There is no abdominal aortic aneurysm. There is
aortic atherosclerosis.

There are mildly prominent lymph nodes in the pelvis, stable. The
largest lymph node is in the right internal iliac node chain
measuring 2.4 x 1.7 cm. There are multiple retroperitoneal lymph
nodes, similar to prior study with the largest of these
retroperitoneal lymph nodes measuring 0.9 cm in short axis diameter.
No new lymph node prominence evident.

Reproductive: Uterus is retroverted. Leiomyomatous changes in the
uterus are grossly stable compared to the previous study. No
extrauterine pelvic mass is evident.

Other: Appendix not seen. No periappendiceal region inflammation. No
abscess or ascites evident in the abdomen or pelvis.

Musculoskeletal: No blastic or lytic bone lesions. No intramuscular
or abdominal wall lesions.

Review of the MIP images confirms the above findings.
IMPRESSION: CT angiogram chest:

1. No demonstrable pulmonary embolus. No thoracic aortic aneurysm or
dissection.

2. Sizable right pleural effusion with consolidation and compressive
atelectasis throughout much of the right lung. Essentially complete
collapse of the right middle and lower lobes remains. Consolidation
medial aspect of the right upper lobe is present.

3. Right paratracheal and right axillary adenopathy, similar to
recent study.

4.  Left lower lobe atelectatic change.

CT abdomen and pelvis:

1. Areas of lymph node prominence, primarily in the right internal
iliac chain, stable and likely of neoplastic etiology given the
known lung carcinoma. No new adenopathy.

2. Gallbladder wall is thickened. Known gallstones are better seen
on recent CT. There is mild biliary duct dilatation. Correlation
with ultrasound to better assess the gallbladder advised given the
degree of wall thickening in this area.

3.  Leiomyomatous uterus.

4. No bowel obstruction. No abscess in the abdomen or pelvis. No
periappendiceal region inflammation.

5. No renal or ureteral calculi. No hydronephrosis. Urinary bladder
wall thickness within normal limits.

## 2019-11-30 IMAGING — DX DG CHEST 1V
1 series · 1 of 1 positions shown · non-contrast
Comparison: 01/01/2019

CLINICAL DATA: Post thoracentesis

EXAM:
CHEST  1 VIEW

[chest ap]
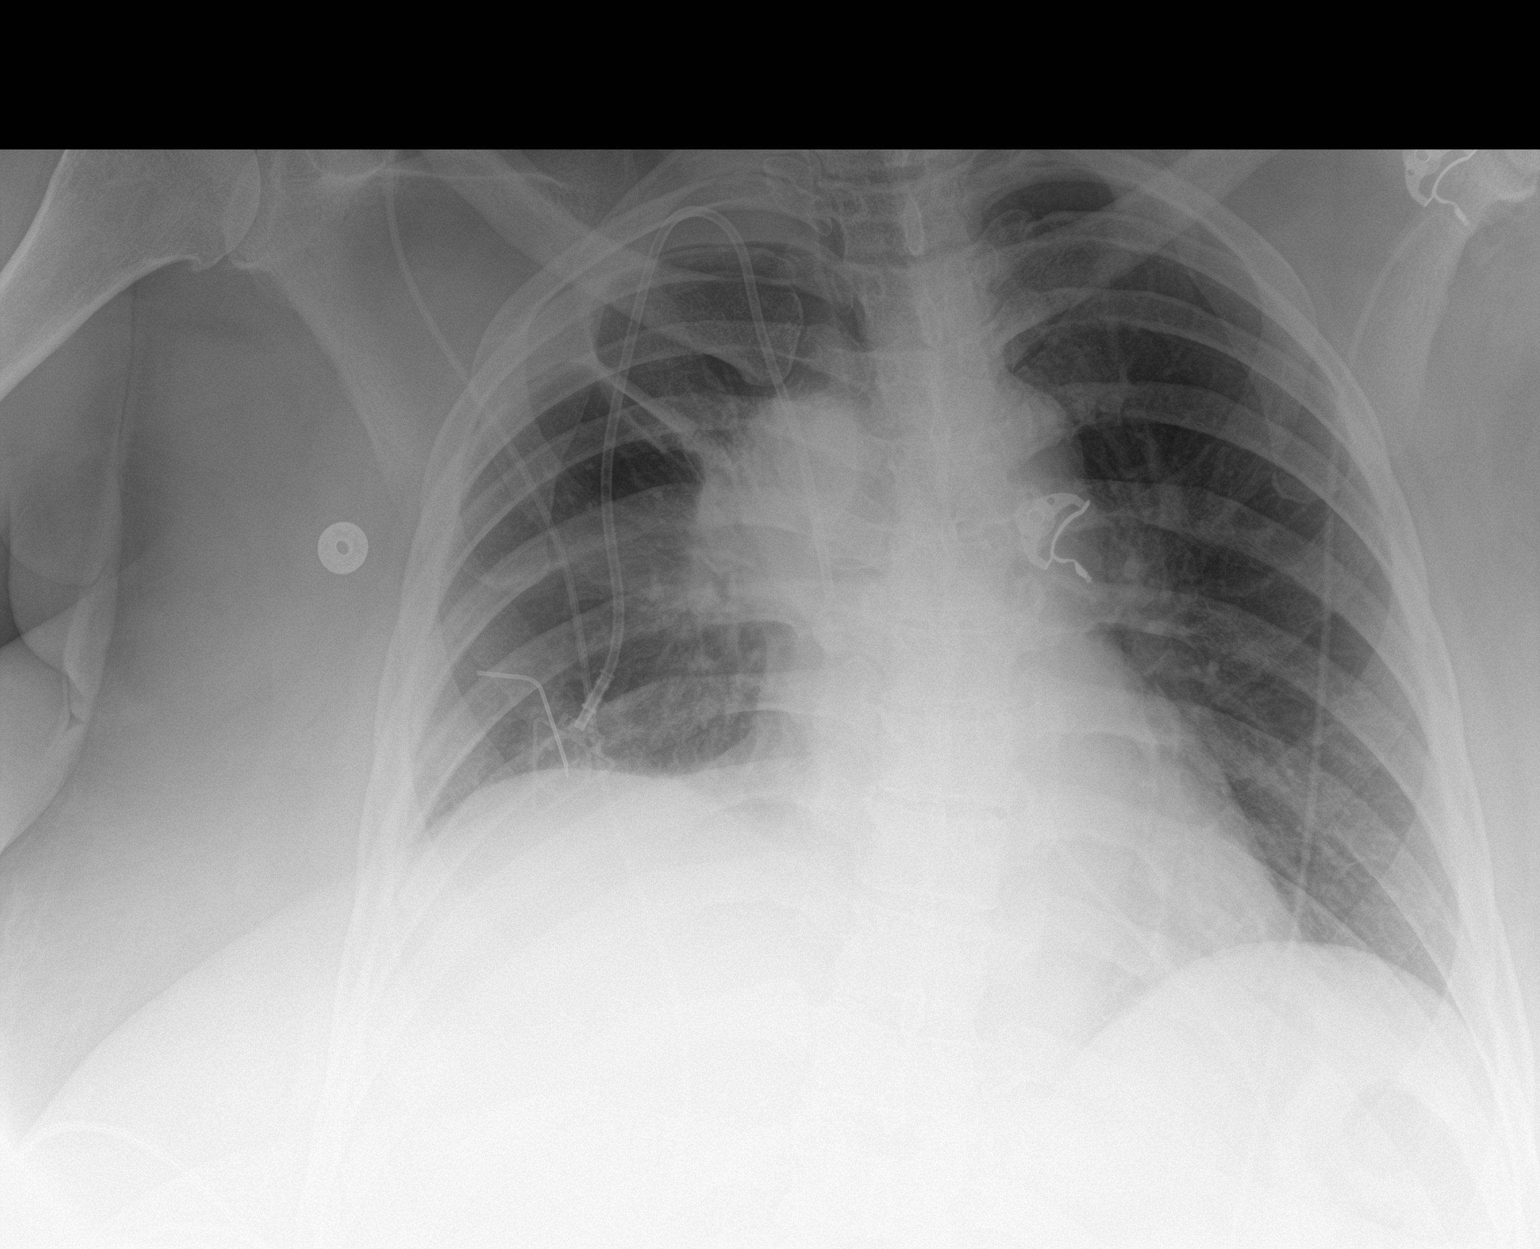

[1 of 1 positions shown; findings below may reference images not displayed]

FINDINGS: Status post right thoracentesis with decreasing right effusion. No
pneumothorax. Medial right upper lobe suprahilar mass again noted,
unchanged. Right Port-A-Cath is unchanged. Left lung clear. Heart is
normal size.
IMPRESSION: Decreasing right effusion following thoracentesis. No pneumothorax.
Otherwise no change.

## 2019-11-30 IMAGING — US US THORACENTESIS ASP PLEURAL SPACE W/IMG GUIDE
1 series · 6 of 6 positions shown · non-contrast
Comparison: none

INDICATION: Patient with history of lung cancer with recurrent malignant right
pleural effusion; request made for diagnostic and therapeutic right
thoracentesis.

[Series 1: us thoracentesis asp pleural space w/img guide · 6 of 6 slices shown]
[im 1/6]
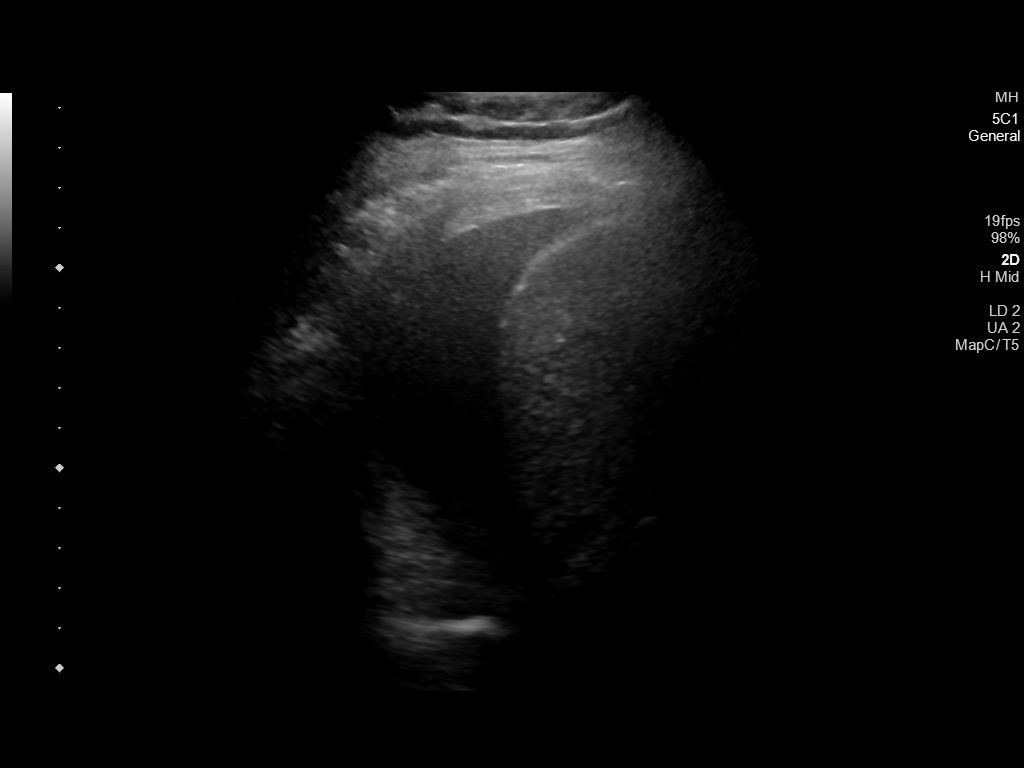
[im 2/6]
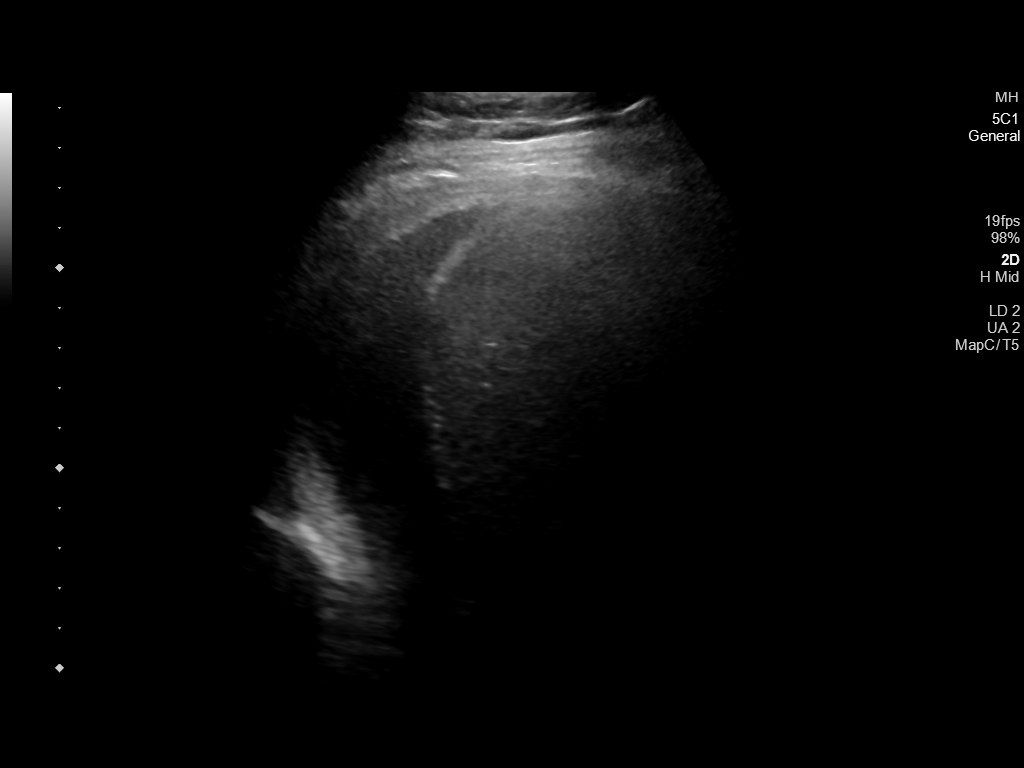
[im 3/6]
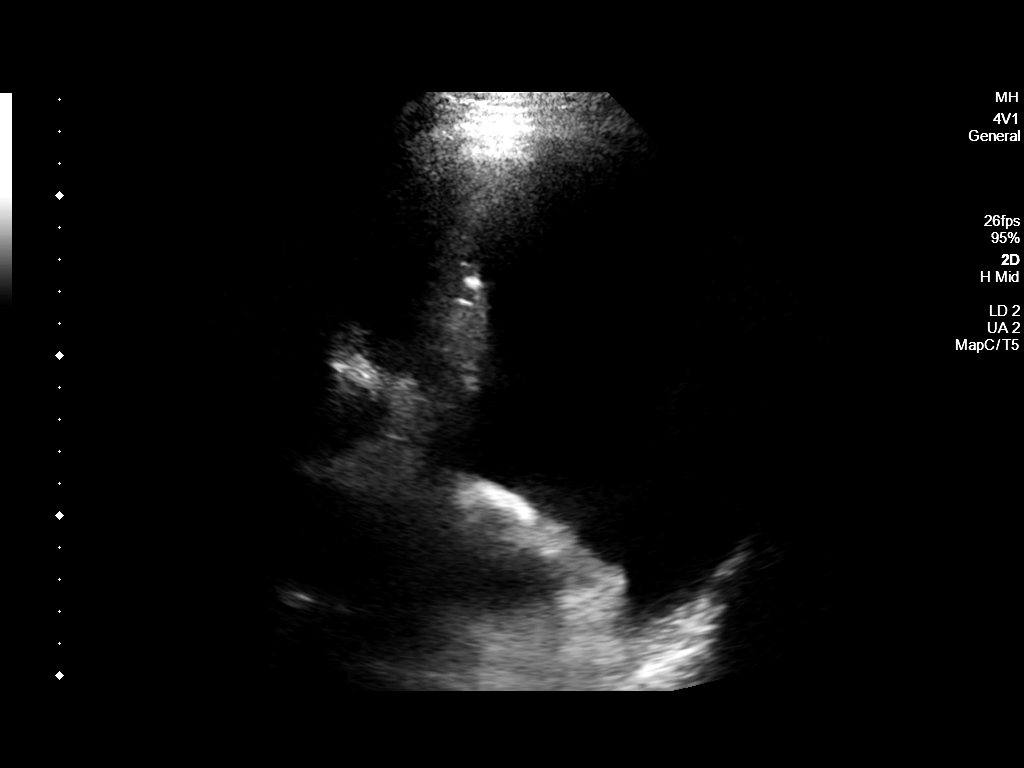
[im 4/6]
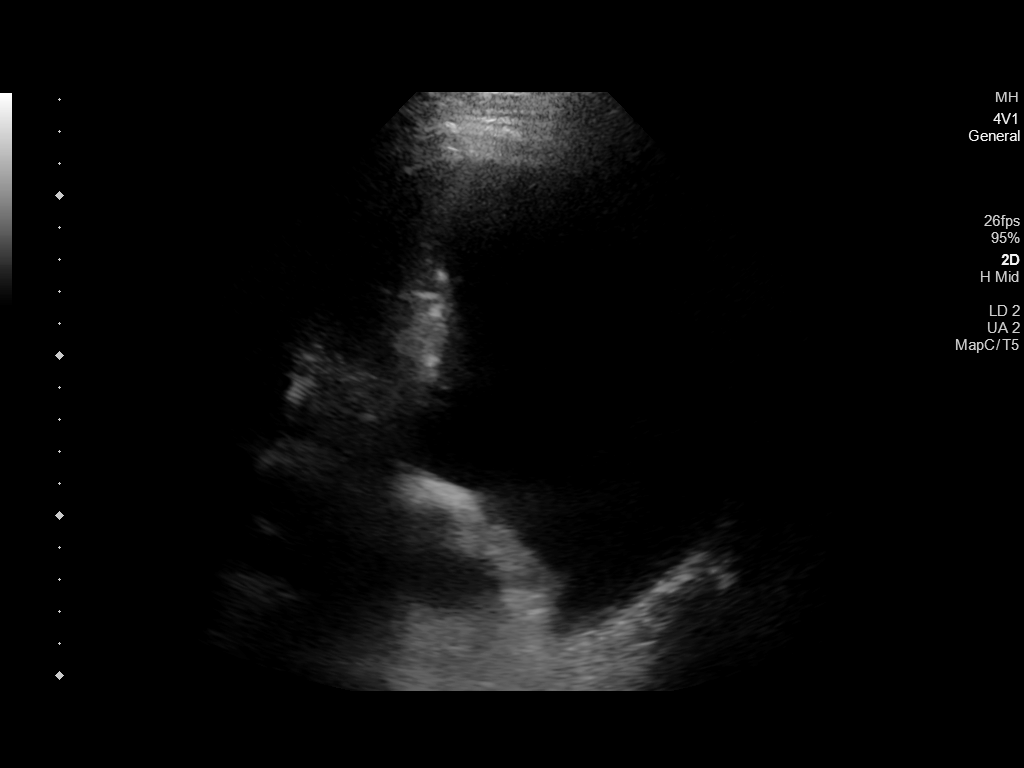
[im 5/6]
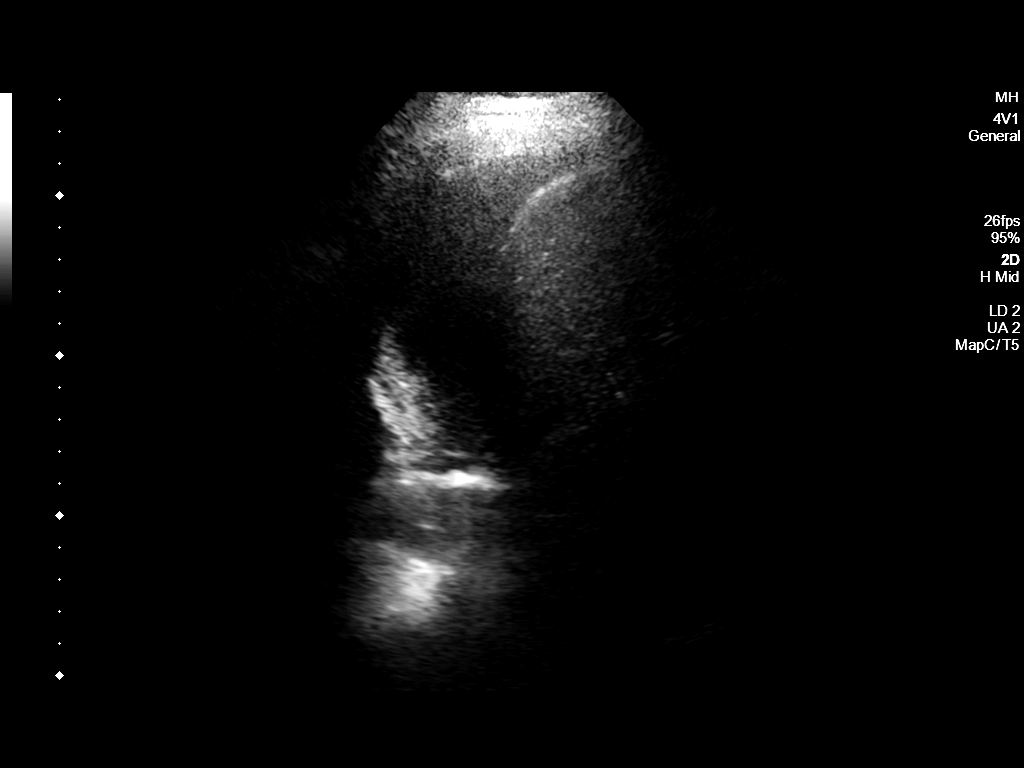
[im 6/6]
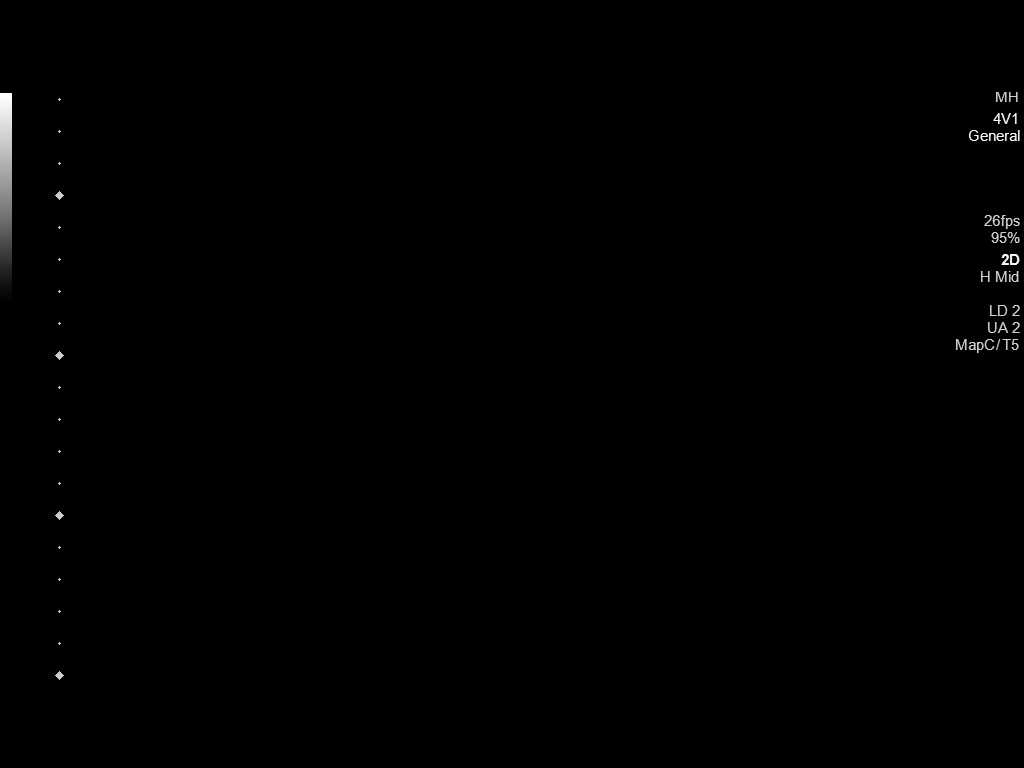

[6 of 6 positions shown; findings below may reference images not displayed]

EXAM:
ULTRASOUND GUIDED DIAGNOSTIC AND THERAPEUTIC RIGHT THORACENTESIS

MEDICATIONS:
None

COMPLICATIONS:
None immediate.

PROCEDURE:
An ultrasound guided thoracentesis was thoroughly discussed with the
patient and questions answered. The benefits, risks, alternatives
and complications were also discussed. The patient understands and
wishes to proceed with the procedure. Written consent was obtained.

Ultrasound was performed to localize and mark an adequate pocket of
fluid in the right chest. The area was then prepped and draped in
the normal sterile fashion. 1% Lidocaine was used for local
anesthesia. Under ultrasound guidance a 6 Fr Safe-T-Centesis
catheter was introduced. Thoracentesis was performed. The catheter
was removed and a dressing applied.
FINDINGS: A total of approximately 920 cc of yellow fluid was removed. Samples
were sent to the laboratory as requested by the clinical team.
IMPRESSION: Successful ultrasound guided diagnostic and therapeutic right
thoracentesis yielding 920 cc of pleural fluid.

## 2019-12-02 IMAGING — CT CT ABD-PELV W/ CM
2 of 5 series · 15 of 46 positions shown, 17 images · IV contrast (omnipaque)
Comparison: 01/01/2019

CLINICAL DATA: Right-sided abdominal pain for a month, increasing
over the last 3 days. History of metastatic adenocarcinoma.

EXAM:
CT ABDOMEN AND PELVIS WITH CONTRAST
TECHNIQUE: Multidetector CT imaging of the abdomen and pelvis was performed
using the standard protocol following bolus administration of
intravenous contrast.
CONTRAST:  100mL OMNIPAQUE IOHEXOL 300 MG/ML  SOLN

[Series 2: axial st · axial · 0.87mm/px · z∈[-599,-144]mm · 12 of 105 slices shown, 14 images]
[im 7/105  soft-tissue]
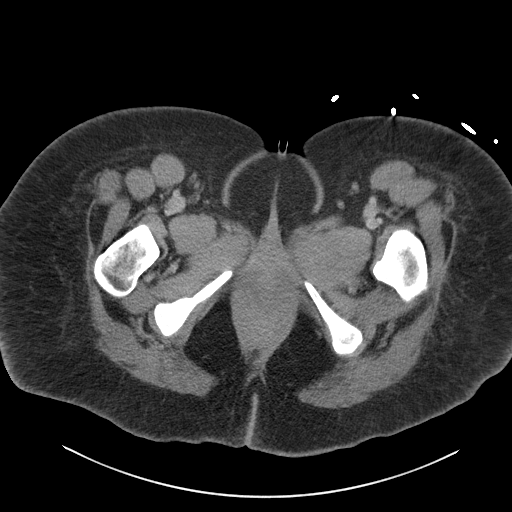
[im 7/105  bone]
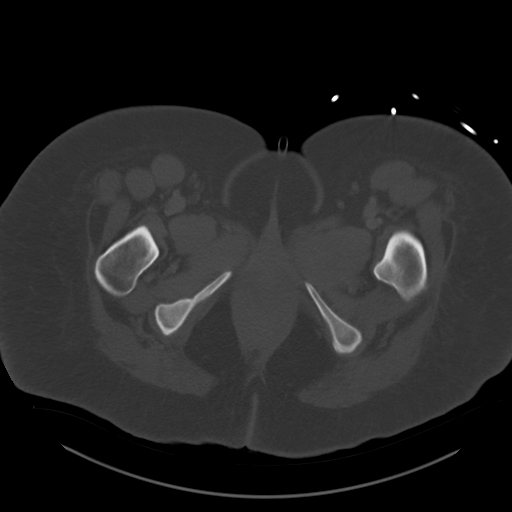
[im 14/105  soft-tissue]
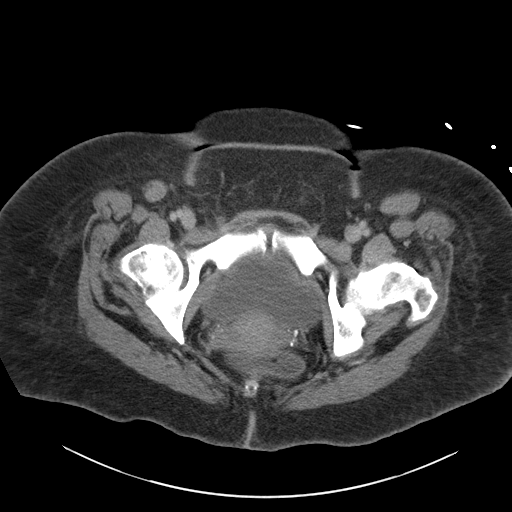
[im 21/105  soft-tissue]
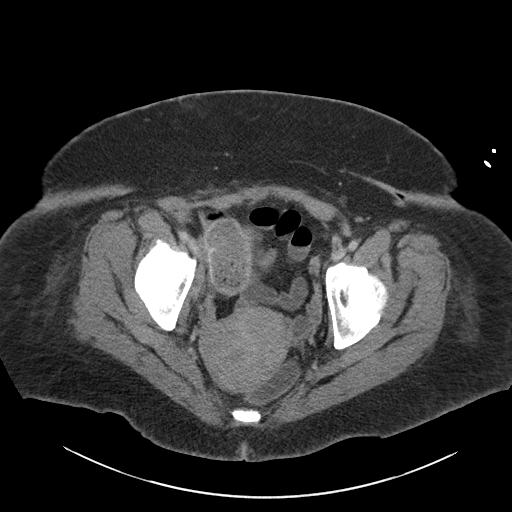
[im 35/105  soft-tissue]
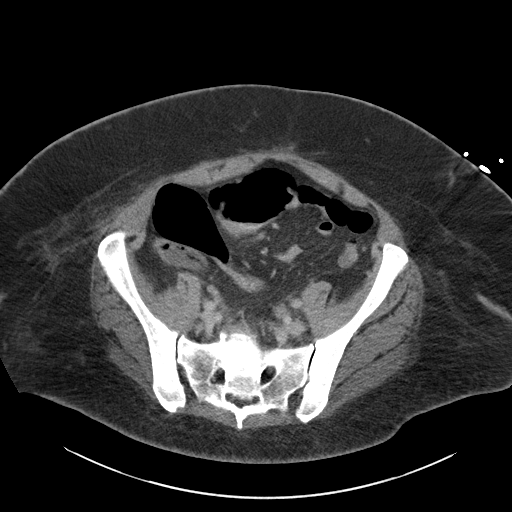
[im 42/105  soft-tissue]
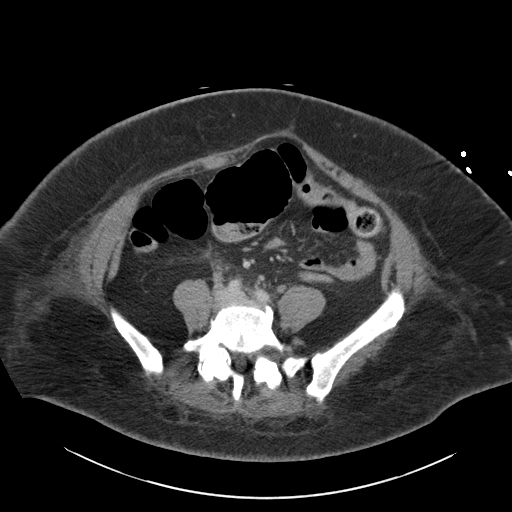
[im 49/105  soft-tissue]
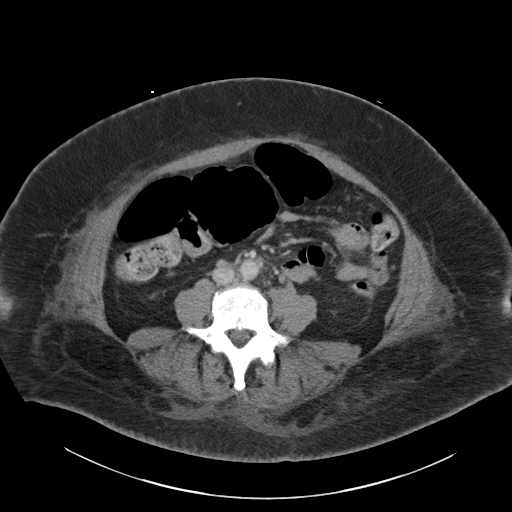
[im 56/105  soft-tissue]
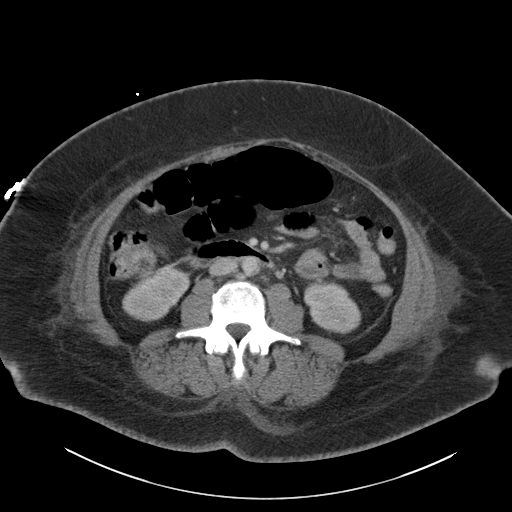
[im 63/105  soft-tissue]
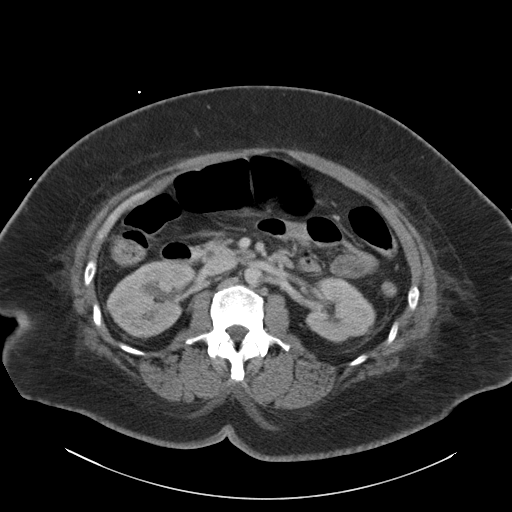
[im 70/105  soft-tissue]
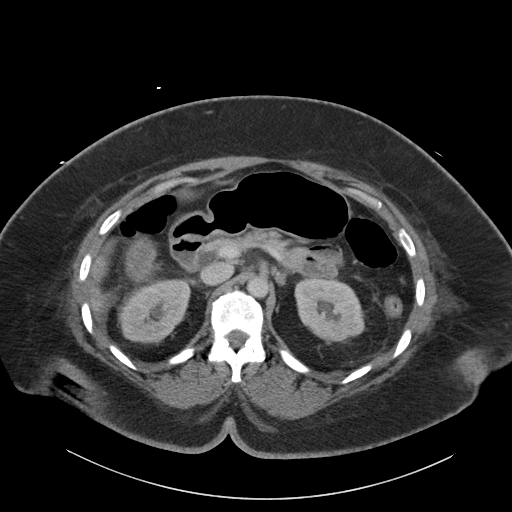
[im 70/105  bone]
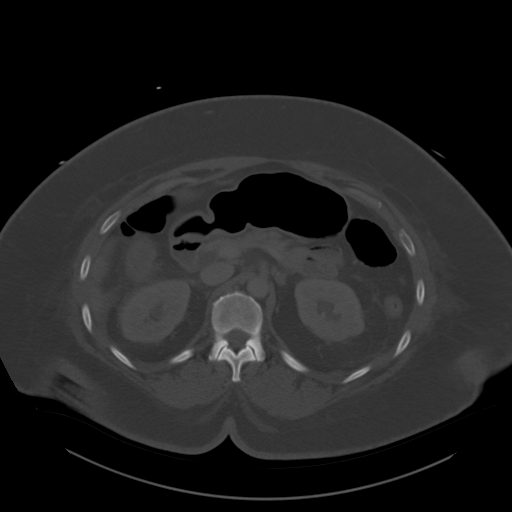
[im 84/105  soft-tissue]
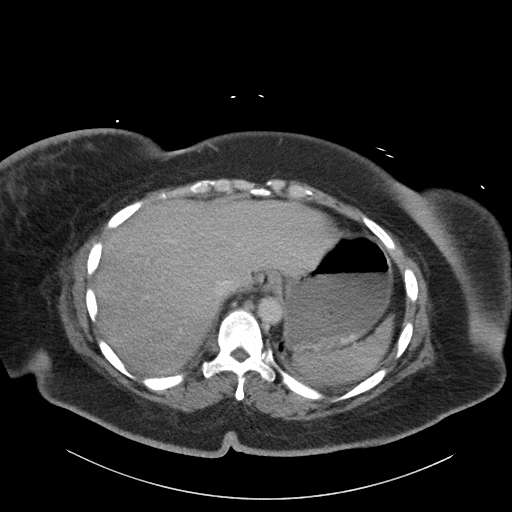
[im 91/105  soft-tissue]
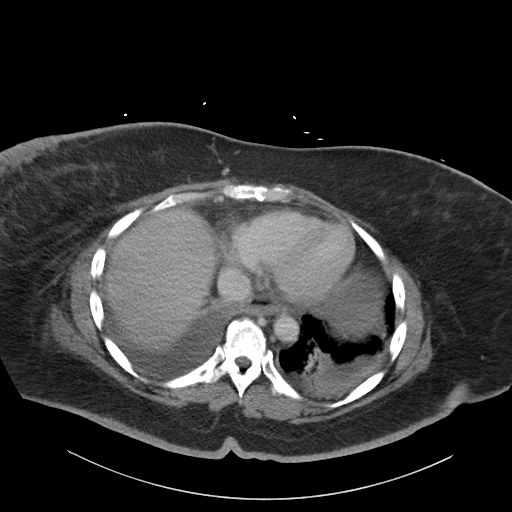
[im 98/105  soft-tissue]
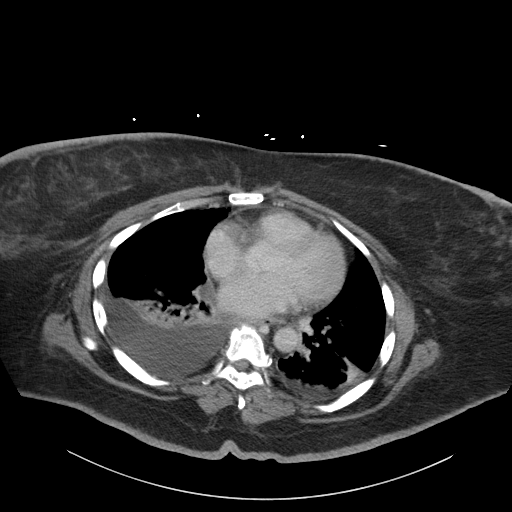

[Series 5: coronal st · coronal · 0.76mm/px · 3 of 139 slices shown]
[im 47/139  soft-tissue]
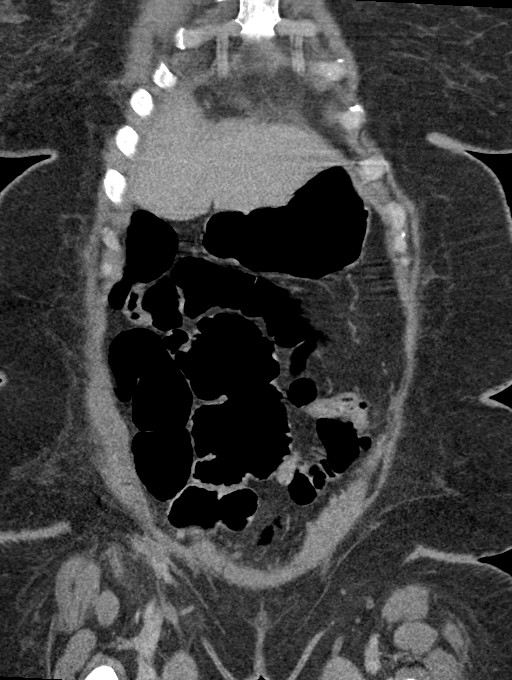
[im 62/139  soft-tissue]
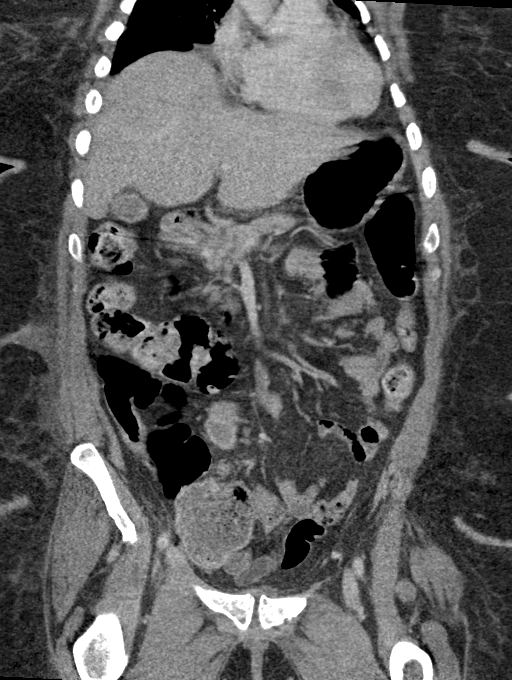
[im 77/139  soft-tissue]
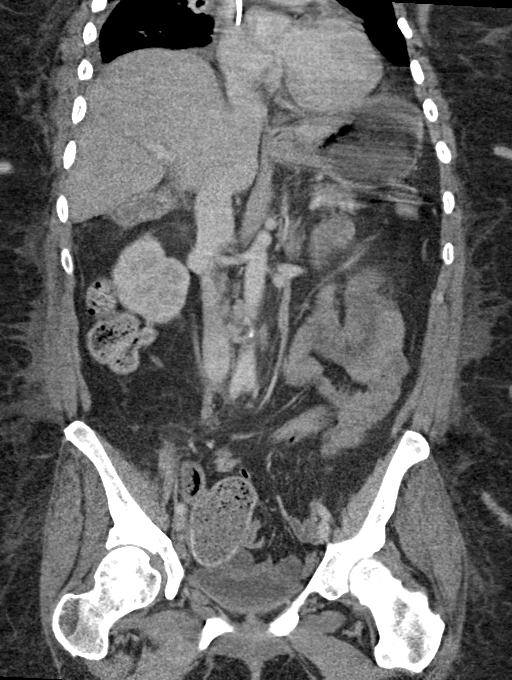

[15 of 46 positions shown; findings below may reference images not displayed]

FINDINGS: Lower chest: Moderate right and small left pleural effusions. There
is dependent opacity in the lower lobes consistent with atelectasis.
Right pleural effusion is smaller than on the prior exam. Left
pleural effusion is new.

Hepatobiliary: Normal liver. Gallbladder mildly distended.
Gallbladder appears heterogeneous consistent with multiple stones,
which were defined on a prior MRCP from 01/01/2019. No adjacent
inflammation. Common bile duct dilated to 11 mm, measuring 1.5 cm on
the prior MRCP, demonstrating distal tapering.

Pancreas: Unremarkable. No pancreatic ductal dilatation or
surrounding inflammatory changes.

Spleen: Normal in size without focal abnormality.

Adrenals/Urinary Tract: No adrenal masses.

Kidneys normal in size orientation and position. No renal masses,
stones or hydronephrosis. Normal ureters. Bladder is unremarkable.

Stomach/Bowel: Stomach is unremarkable. Small bowel and colon are
normal in caliber. No bowel wall thickening. No inflammation. Normal
appendix visualized.

Vascular/Lymphatic: Mildly enlarged retrocrural lymph node to the
right of the aorta just below the aortic hiatus, 12 mm in short
axis. This is stable from the prior CT. No other enlarged lymph
nodes. Mild aortic atherosclerosis. No aneurysm.

Reproductive: Uterus is prominent with evidence of fibroids, largest
from the fundus measuring 3.2 cm. This appearance is stable. No
adnexal masses.

Other: No abdominal wall hernia.  No ascites.

Musculoskeletal: No fracture or acute finding. No osteoblastic or
osteolytic lesions.
IMPRESSION: 1. No acute findings within the abdomen or pelvis.
2. Moderate right pleural effusion decreased from the prior CT.
Small left pleural effusion new from the prior CT. Dependent
atelectasis in the lower lobes.
3. Gallstones without evidence of acute cholecystitis. Chronic
dilation of common bile duct. Findings stable from the recent prior
MRCP.
4. No evidence of bowel inflammation.  Normal appendix visualized.
5. Aortic atherosclerosis.

## 2019-12-02 IMAGING — DX DG CHEST 1V PORT
1 series · 1 of 1 positions shown · non-contrast
Comparison: 01/02/2019, 01/01/2019 CT

CLINICAL DATA: Shortness of breath

EXAM:
PORTABLE CHEST 1 VIEW

[chest ap]
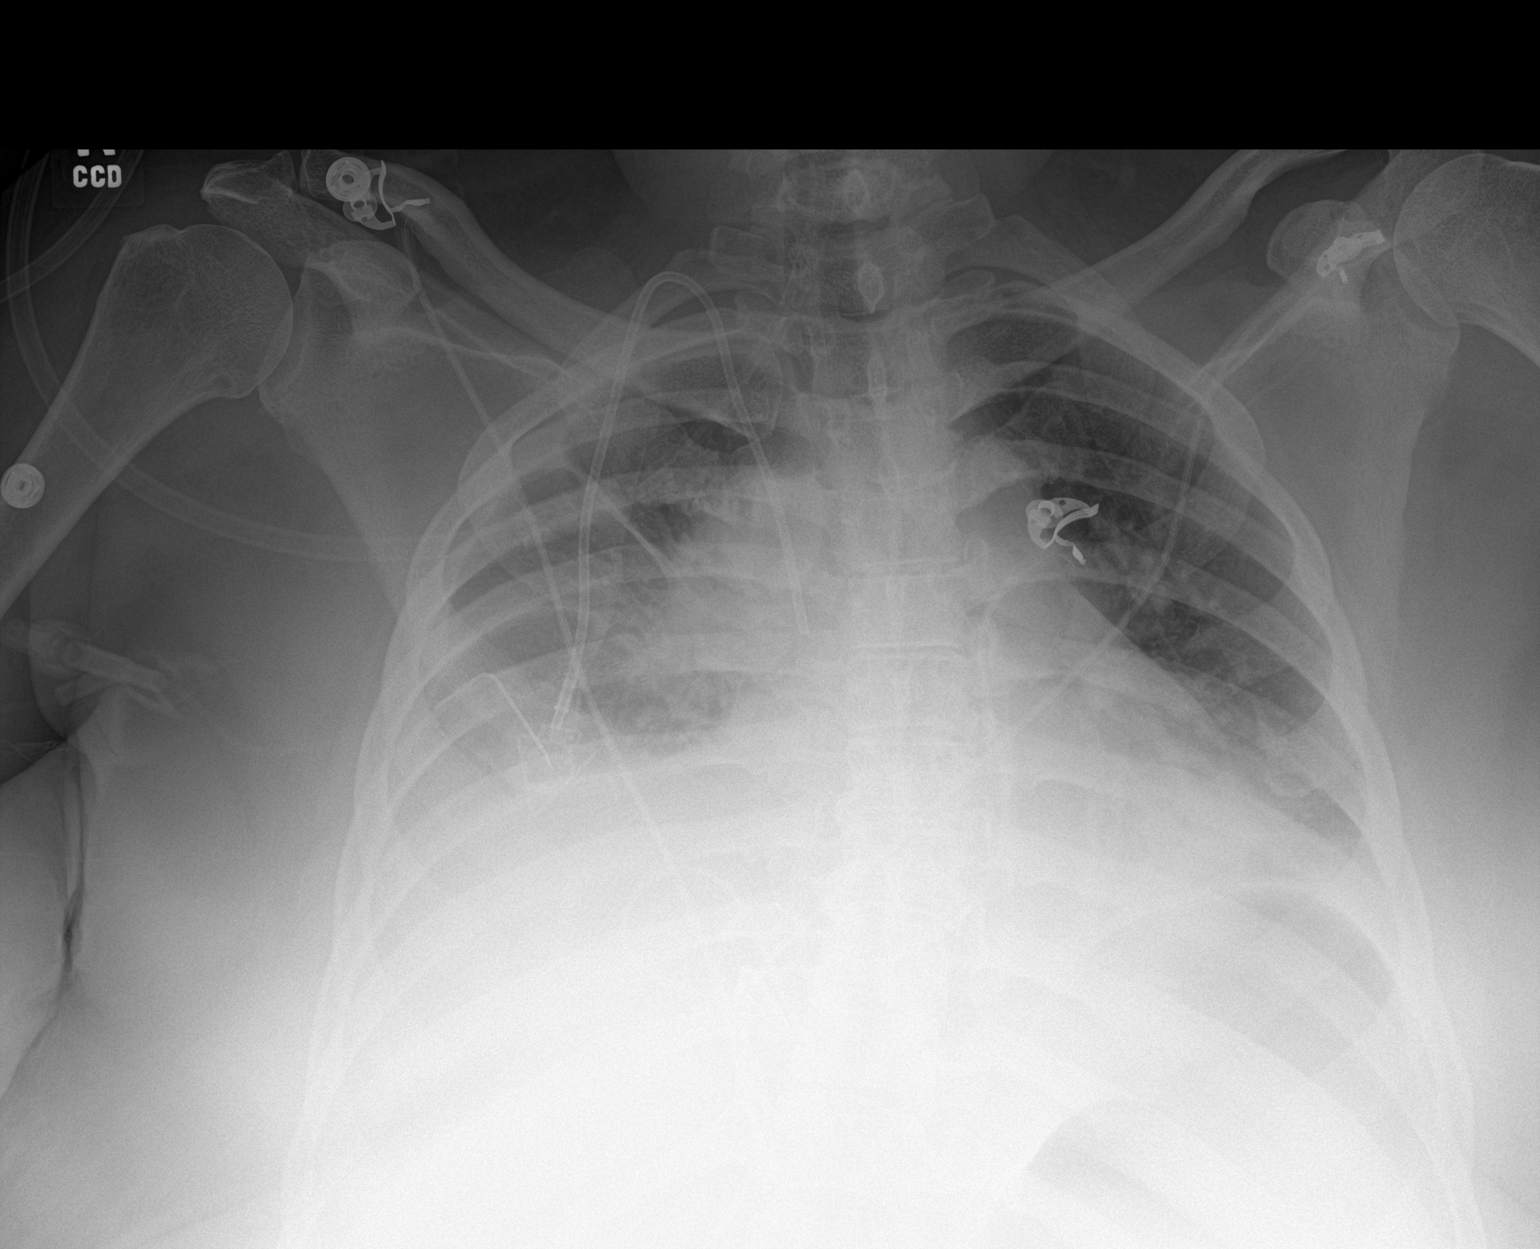

[1 of 1 positions shown; findings below may reference images not displayed]

FINDINGS: RIGHT-sided PowerPort tip overlies the superior vena cava. Heart
size is partially obscured by opacities at the lung bases. Increased
opacity at the RIGHT lung base and associated bilateral pleural
effusions. Persistent RIGHT perihilar adenopathy.
IMPRESSION: 1. Increased opacity at the RIGHT lung base and bilateral pleural
effusions.
2. Persistent RIGHT perihilar adenopathy.
3. No pneumothorax.

## 2019-12-08 IMAGING — DX DG CHEST 1V PORT
1 series · 1 of 1 positions shown · non-contrast
Comparison: Chest radiograph dated 01/04/2019

CLINICAL DATA: Shortness of breath

EXAM:
PORTABLE CHEST 1 VIEW

[chest ap]
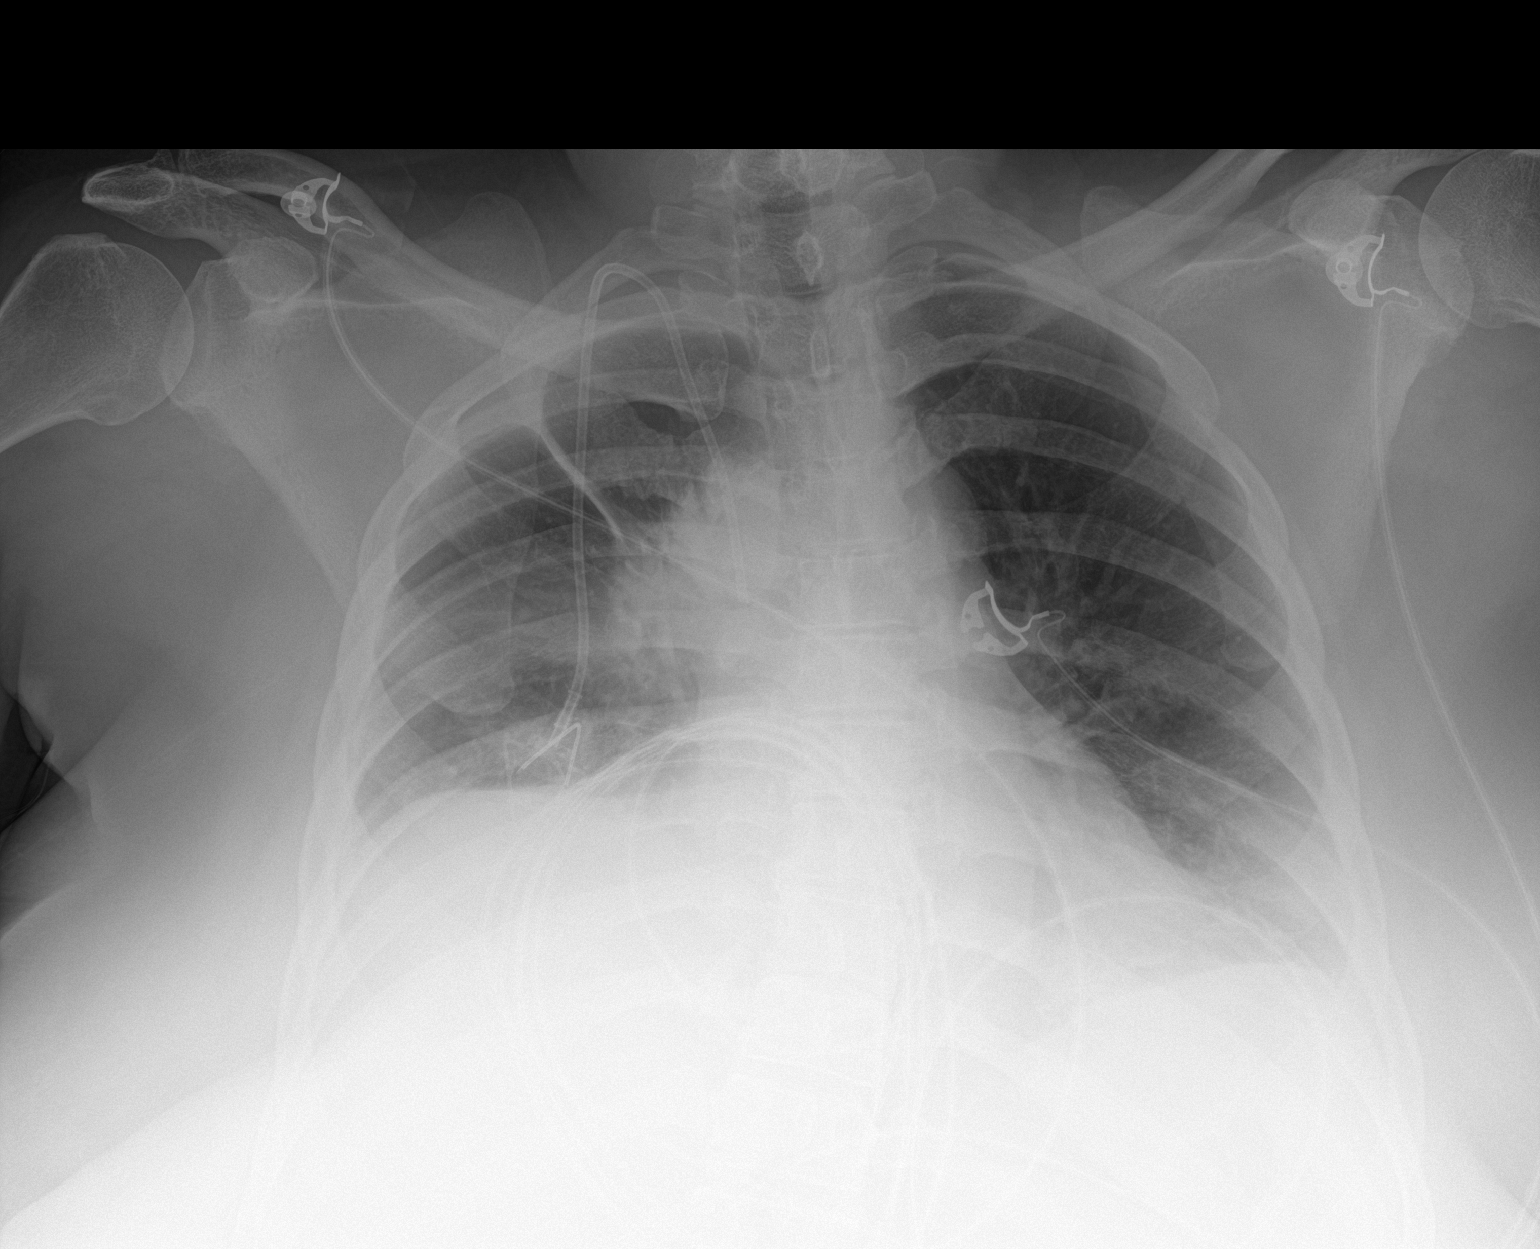

[1 of 1 positions shown; findings below may reference images not displayed]

FINDINGS: The heart size and mediastinal contours are within normal limits.
Right suprahilar consolidation/atelectasis appears unchanged. A
right pleural effusion with associated atelectasis has decreased
since 01/04/2019. The left lung is clear. There is no pneumothorax.
The visualized skeletal structures are unremarkable. A right
internal jugular central venous port catheter tip overlies the
superior vena cava.
IMPRESSION: 1. Unchanged right suprahilar consolidation/atelectasis.
2. Right pleural effusion with associated atelectasis has decreased
since 01/04/2019. No pneumothorax.

## 2019-12-10 IMAGING — DX DG CHEST 1V PORT
1 series · 1 of 1 positions shown · non-contrast
Comparison: 01/10/2019

CLINICAL DATA: Shortness of breath

EXAM:
PORTABLE CHEST 1 VIEW

[chest ap]
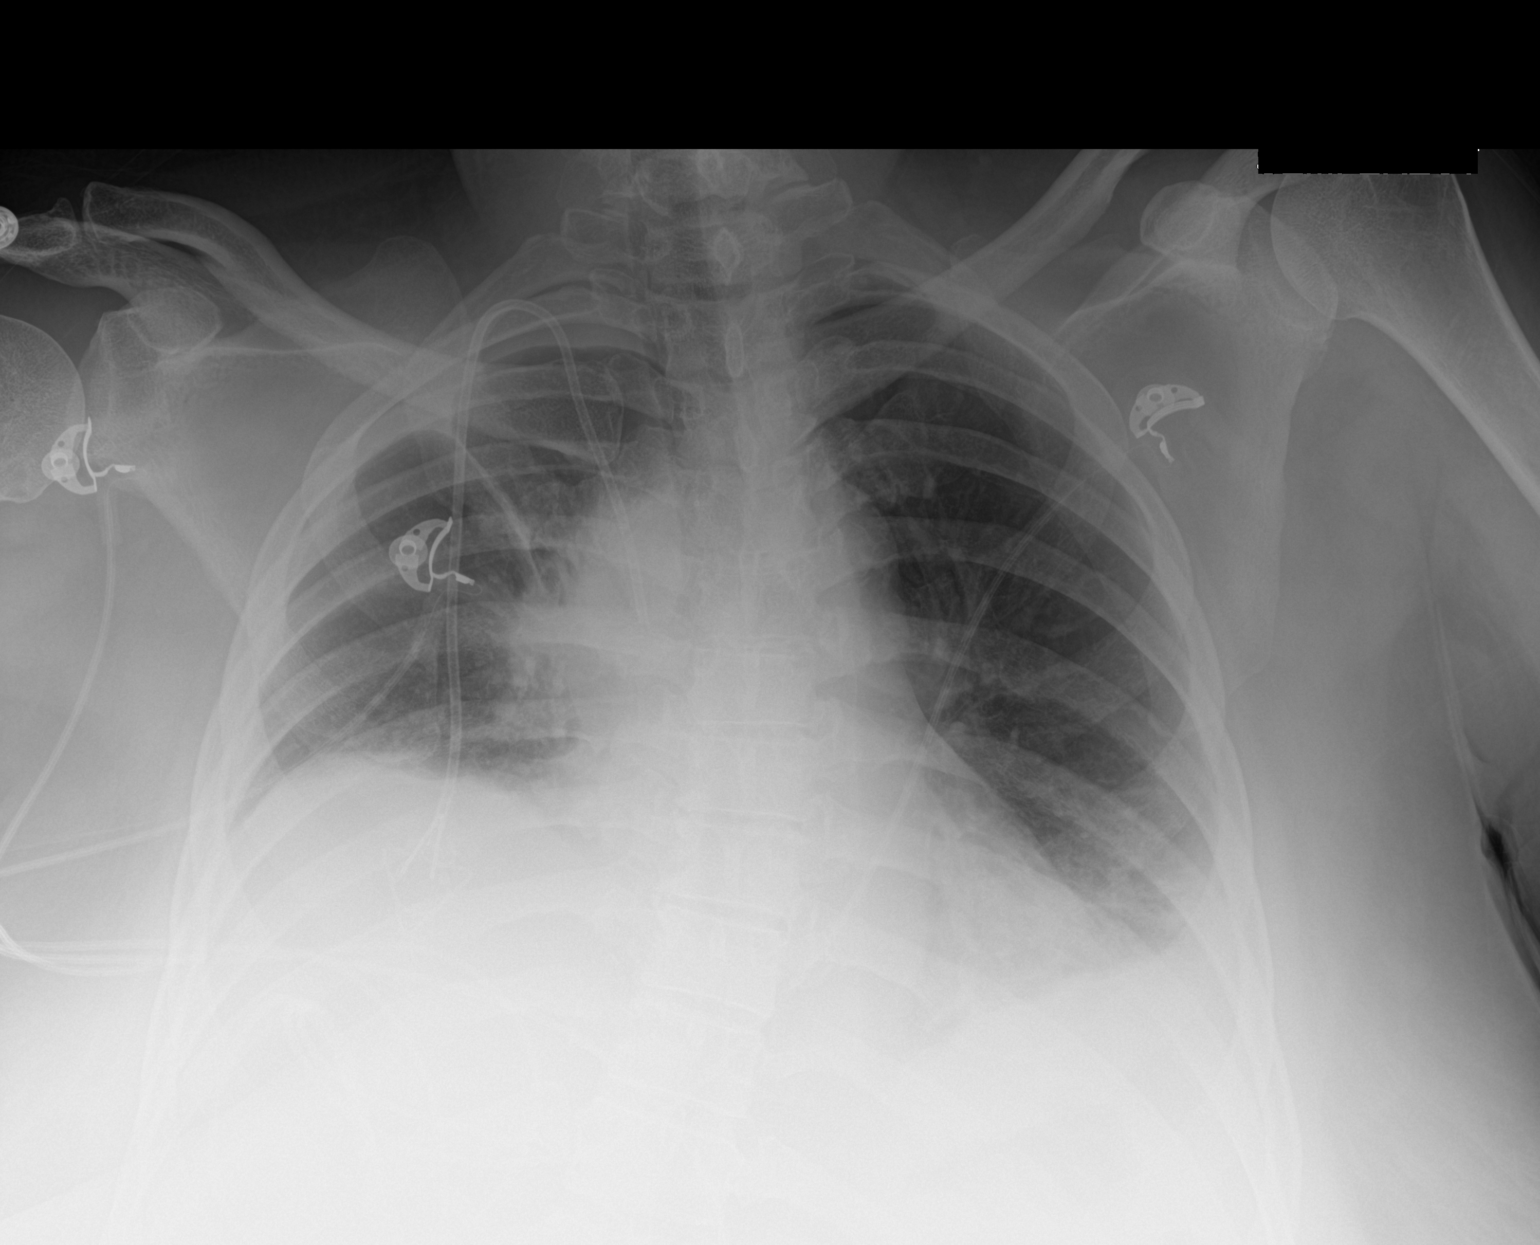

[1 of 1 positions shown; findings below may reference images not displayed]

FINDINGS: No significant change in AP portable examination with a spiculated
perihilar mass of the right lung, elevation of the right
hemidiaphragm, and small bilateral pleural effusions. No new
airspace opacity. Right chest port catheter. Heart is unremarkable.
IMPRESSION: No significant change in AP portable examination with a spiculated
perihilar mass of the right lung, elevation of the right
hemidiaphragm, and small bilateral pleural effusions. No new
airspace opacity.

## 2019-12-11 IMAGING — US US RENAL
1 series · 14 of 24 positions shown · non-contrast
Comparison: CT dated 01/04/2019

CLINICAL DATA: Hematuria

EXAM:
RENAL / URINARY TRACT ULTRASOUND COMPLETE

[Series 1: us renal · 14 of 24 slices shown]
[im 1/24]
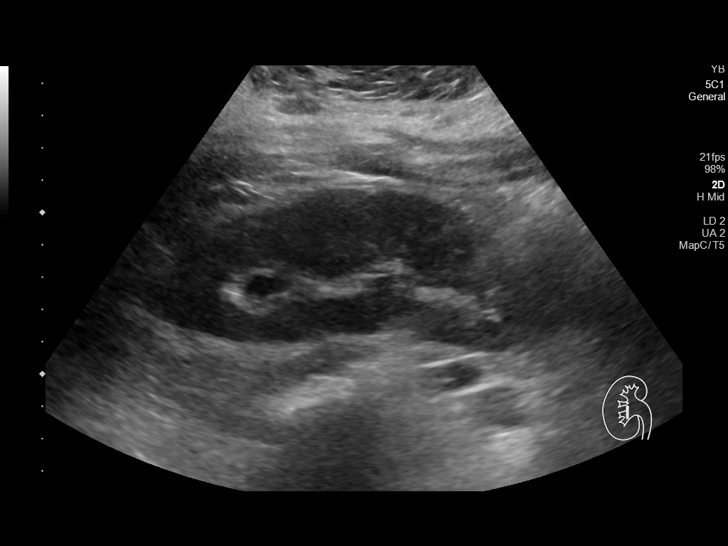
[im 3/24]
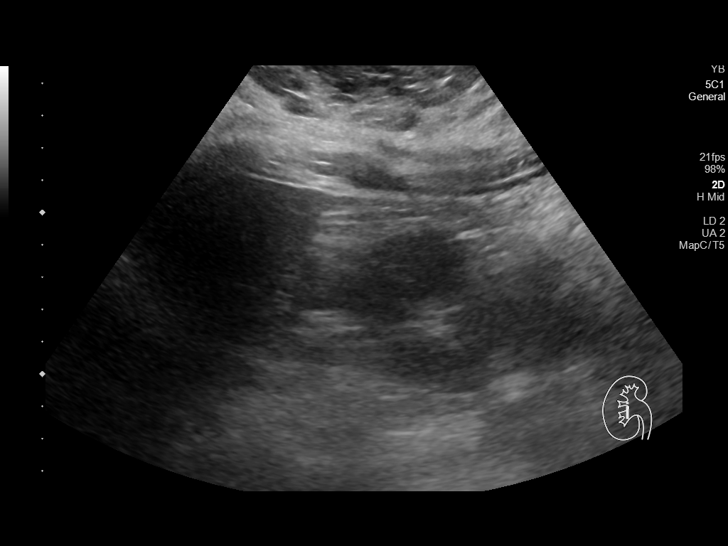
[im 5/24]
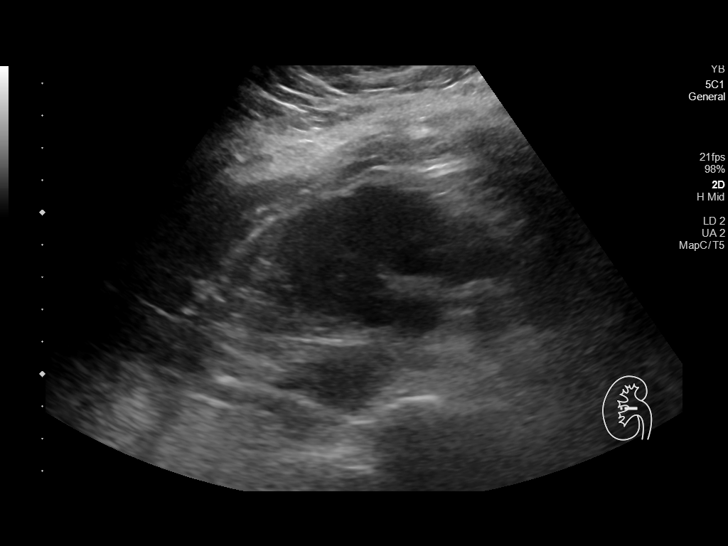
[im 7/24]
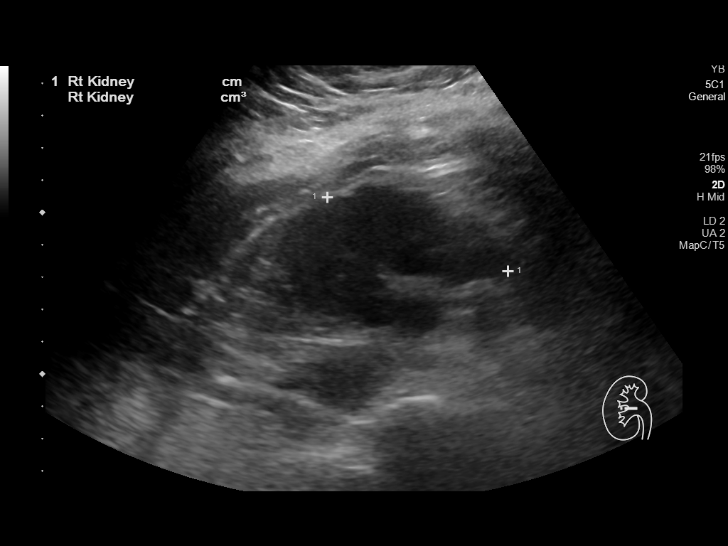
[im 8/24]
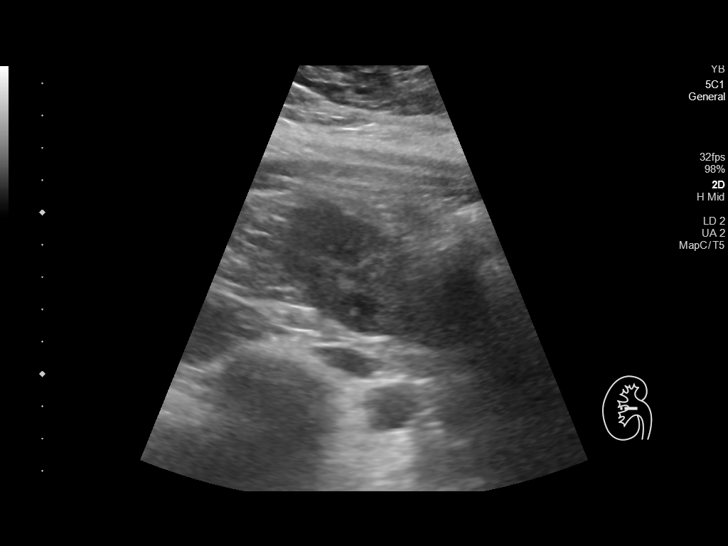
[im 10/24]
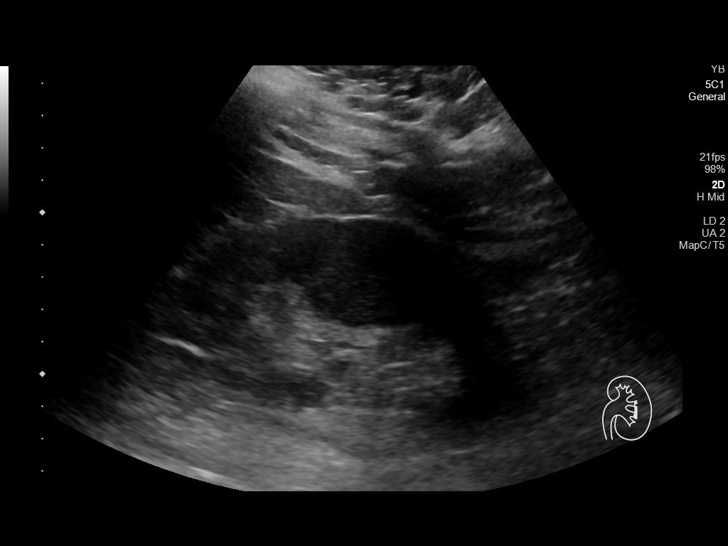
[im 12/24]
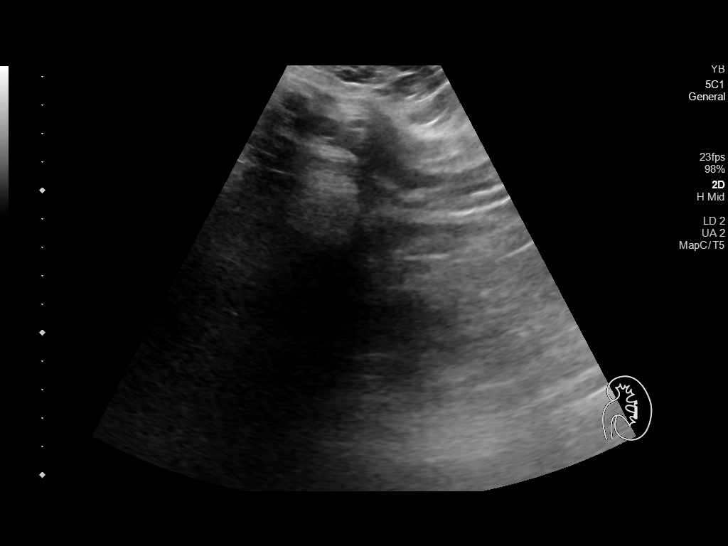
[im 13/24]
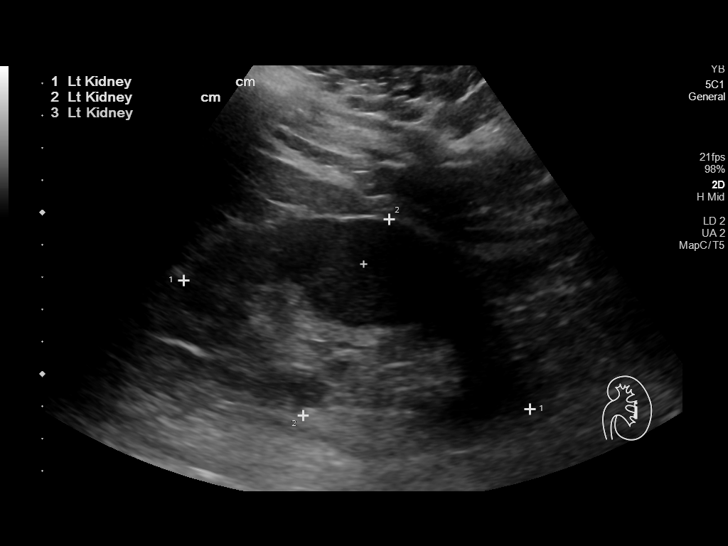
[im 15/24]
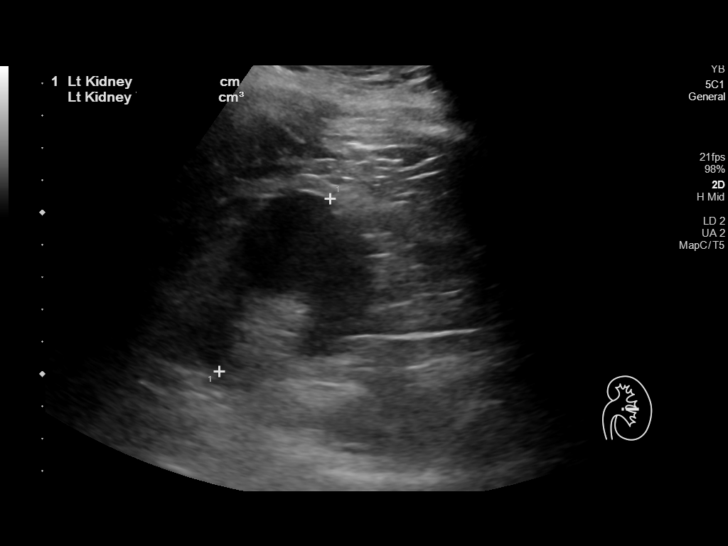
[im 17/24]
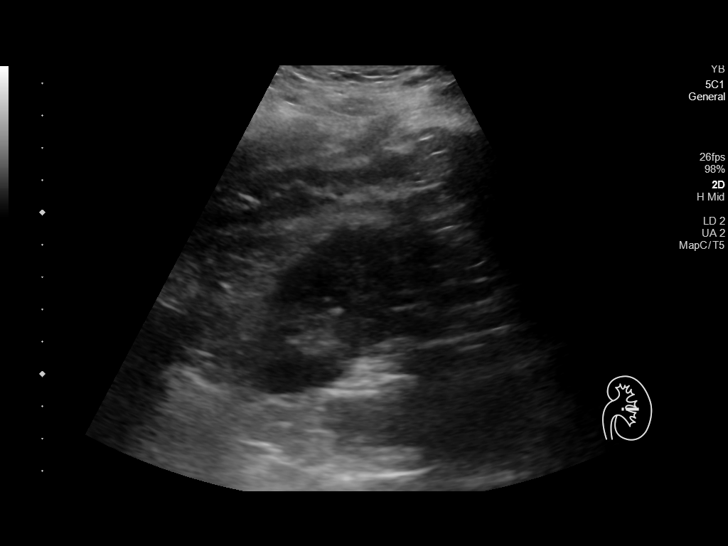
[im 19/24]
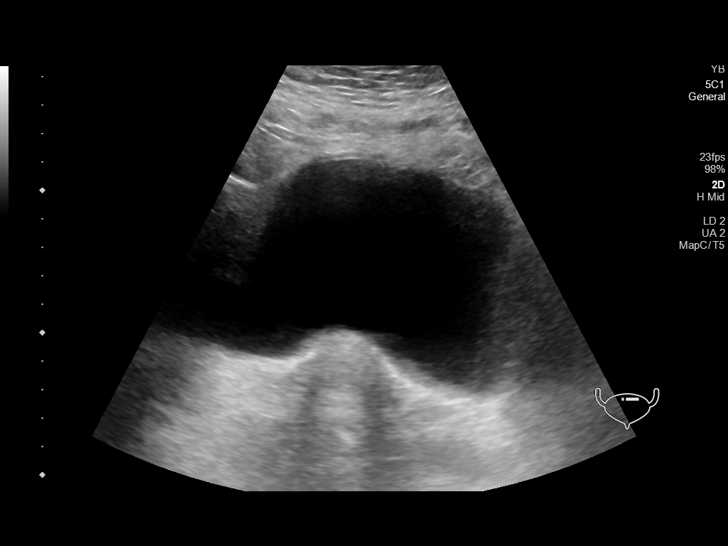
[im 20/24]
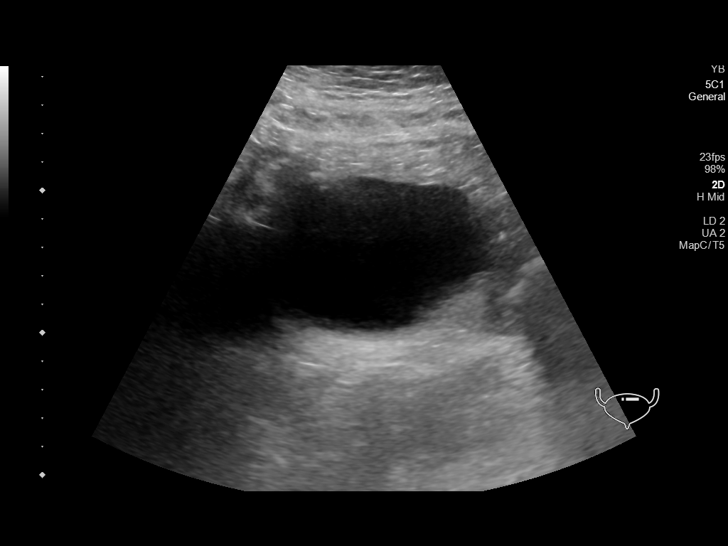
[im 22/24]
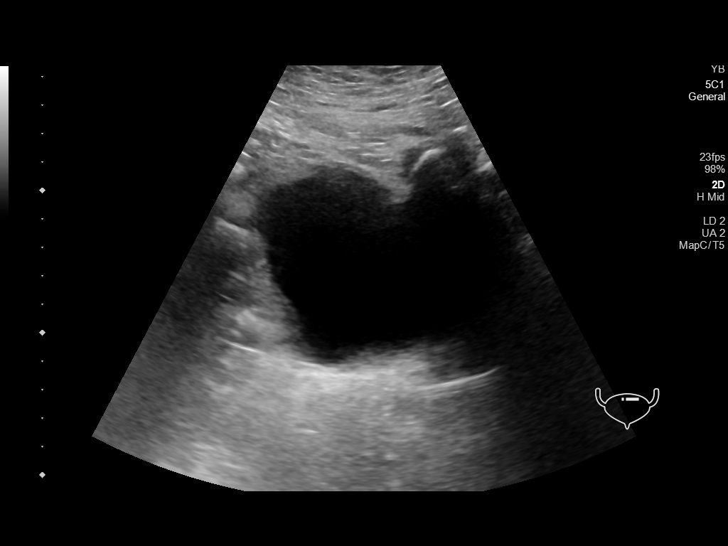
[im 24/24]
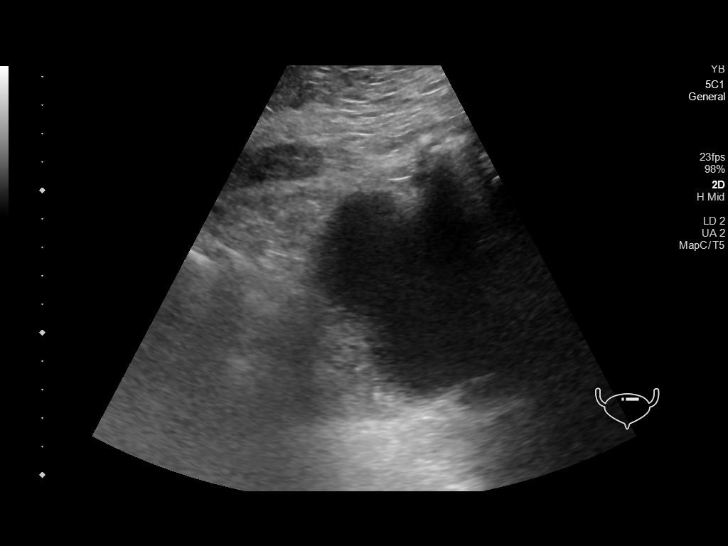

[14 of 24 positions shown; findings below may reference images not displayed]

FINDINGS: Right Kidney:

Renal measurements: 11.9 x 4.5 x 6 cm = volume: 170 mL .
Echogenicity within normal limits. No mass or hydronephrosis
visualized.

Left Kidney:

Renal measurements: 11.4 x 6.6 x 6.4 cm = volume: 252 mL.
Echogenicity within normal limits. No mass or hydronephrosis
visualized.

Bladder:

Appears normal for degree of bladder distention.
IMPRESSION: No acute abnormality detected. No finding to explain the patient's
hematuria.

## 2019-12-12 IMAGING — DX DG CHEST 1V PORT
1 series · 1 of 1 positions shown · non-contrast
Comparison: Chest radiograph 01/12/2019

CLINICAL DATA: Cough, SOB - past medical history significant for
metastatic adenocarcinoma, nonischemic cardiomyopathy, hypertension,
hypothyroidism, history of malignant pleural effusion status post
thoracentesis x3

EXAM:
PORTABLE CHEST 1 VIEW

[chest ap]
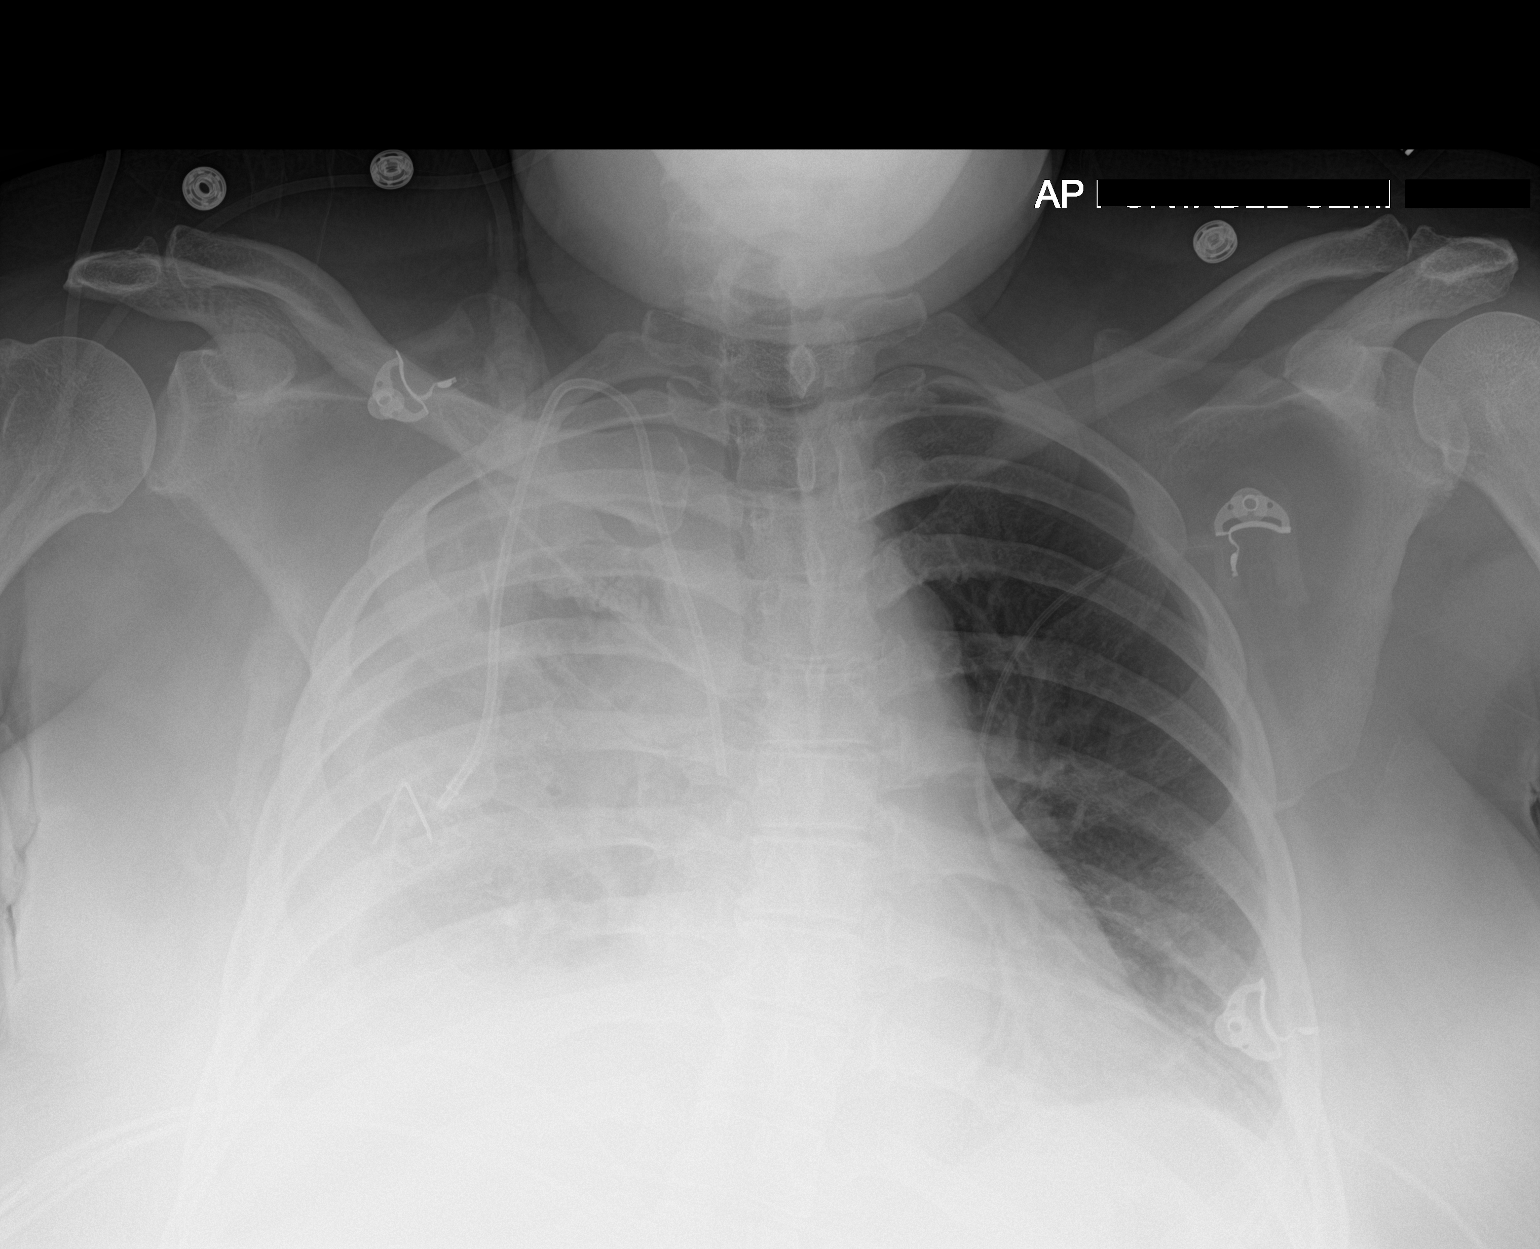

[1 of 1 positions shown; findings below may reference images not displayed]

FINDINGS: Stable cardiomediastinal contours. There is new complete
opacification of the right hemithorax likely secondary to effusion
and associated atelectasis. The previously seen spiculated right
perihilar mass is poorly visualized due to the opacification of the
adjacent lung. The left lung is clear. No pneumothorax. Right chest
port remains in place.
IMPRESSION: 1. Complete opacification of the right hemithorax likely secondary
to reaccumulated effusion and secondary atelectasis of the adjacent
lung.
2. The previously seen spiculated right perihilar mass is poorly
visualized due to the adjacent lung opacification.

These results were called by telephone at the time of interpretation
on 01/14/2019 at [DATE] to provider KOI LEIVA , who verbally
acknowledged these results.

## 2019-12-12 IMAGING — CT CT CHEST W/ CM
2 of 3 series · 15 of 36 positions shown, 18 images · IV contrast (omnipaque)
Comparison: Plain film of earlier in the day. Chest CT 01/01/2019
reviewed.

CLINICAL DATA: Shortness of breath. Pleural effusion. History of
stage IV lung cancer.

EXAM:
CT CHEST WITH CONTRAST
TECHNIQUE: Multidetector CT imaging of the chest was performed during
intravenous contrast administration.
CONTRAST:  75mL OMNIPAQUE IOHEXOL 300 MG/ML  SOLN

[Series 2: axial st · axial · 0.77mm/px · z∈[-332,-48]mm · 12 of 168 slices shown, 15 images]
[im 13/168  mediastinal]
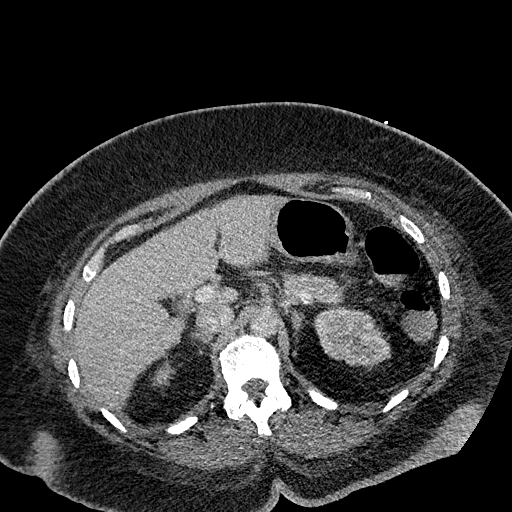
[im 13/168  lung]
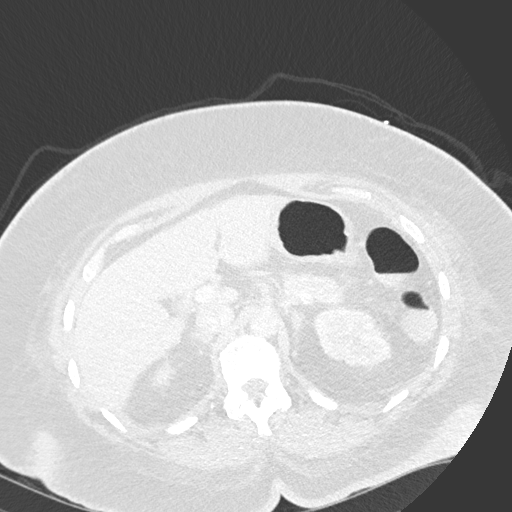
[im 25/168  lung]
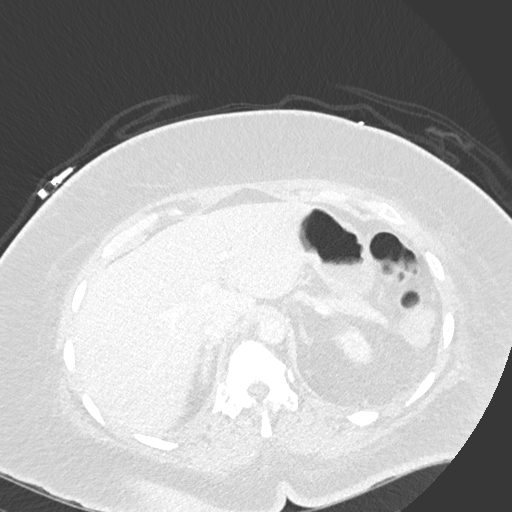
[im 38/168  lung]
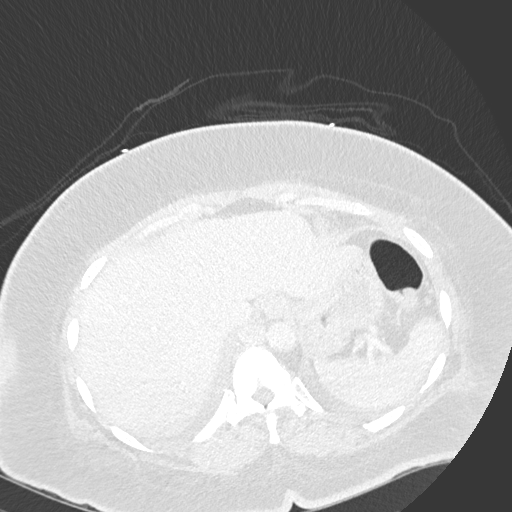
[im 50/168  lung]
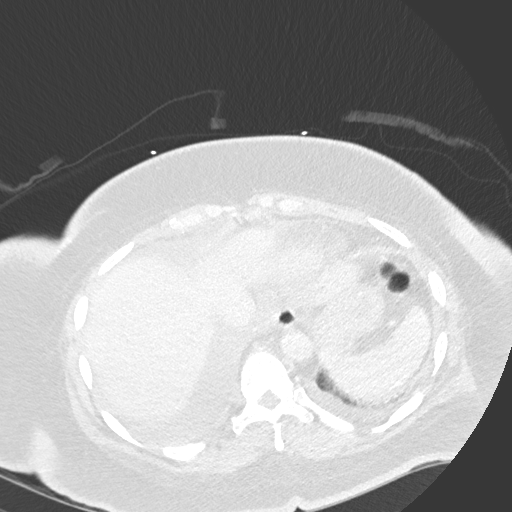
[im 62/168  mediastinal]
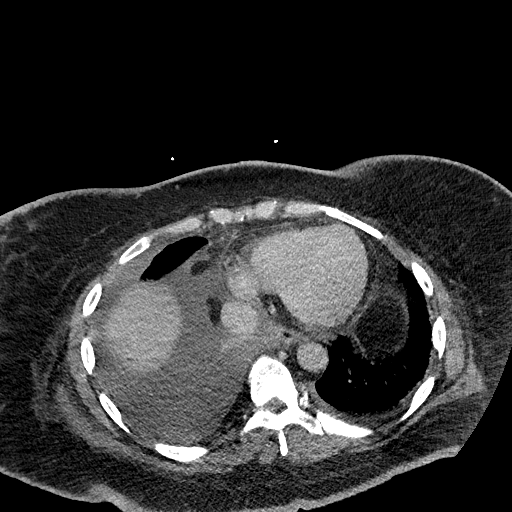
[im 62/168  lung]
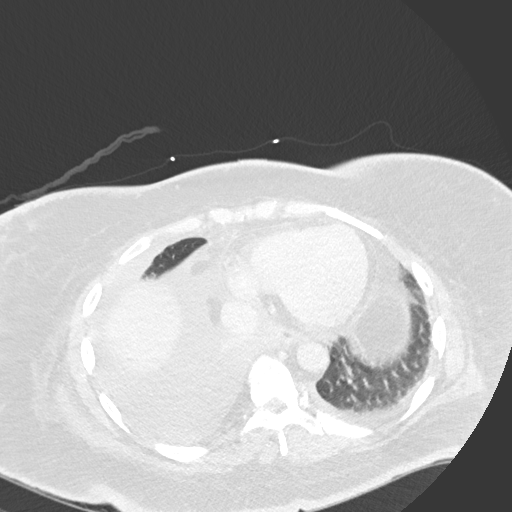
[im 75/168  lung]
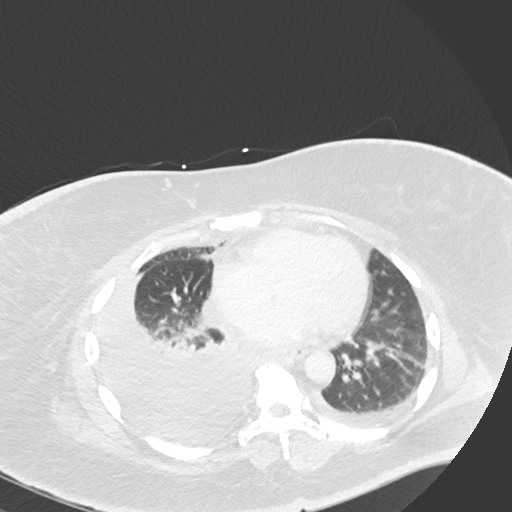
[im 93/168  lung]
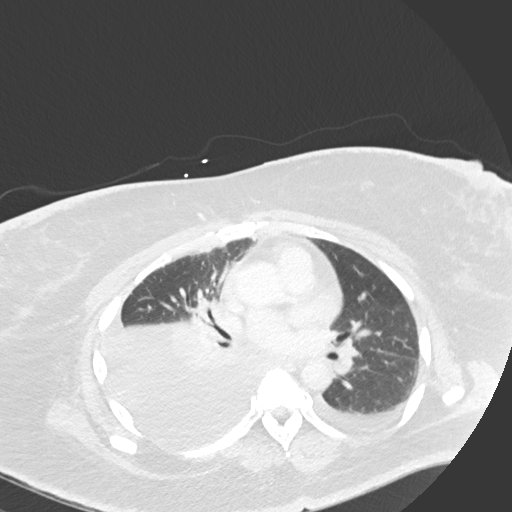
[im 106/168  lung]
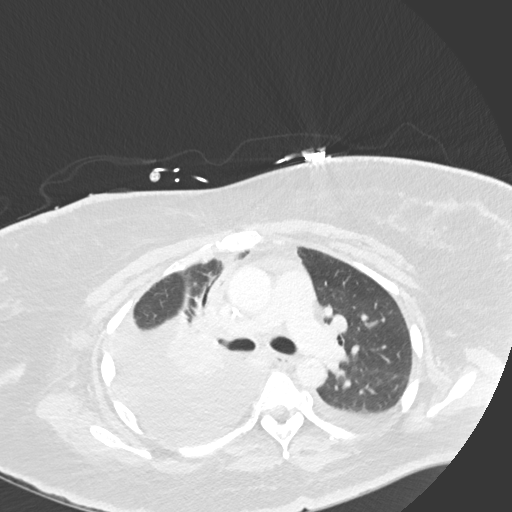
[im 118/168  mediastinal]
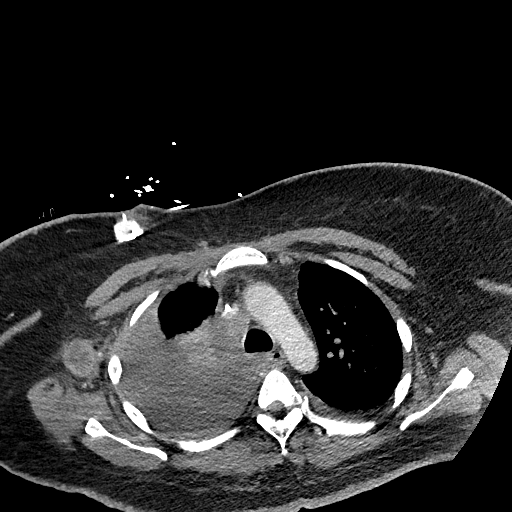
[im 118/168  lung]
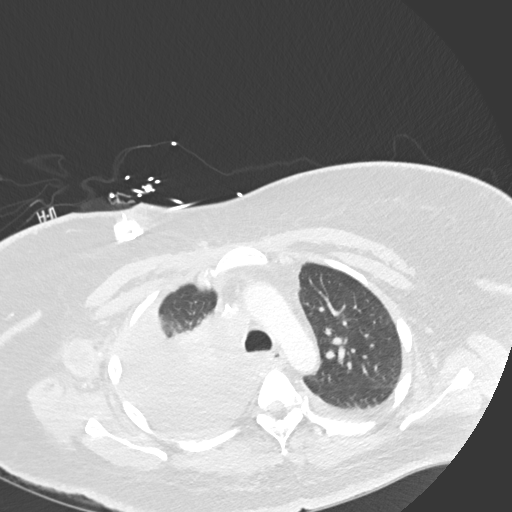
[im 130/168  lung]
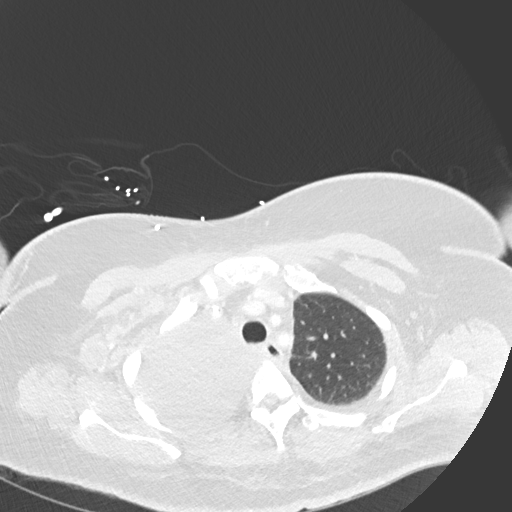
[im 143/168  lung]
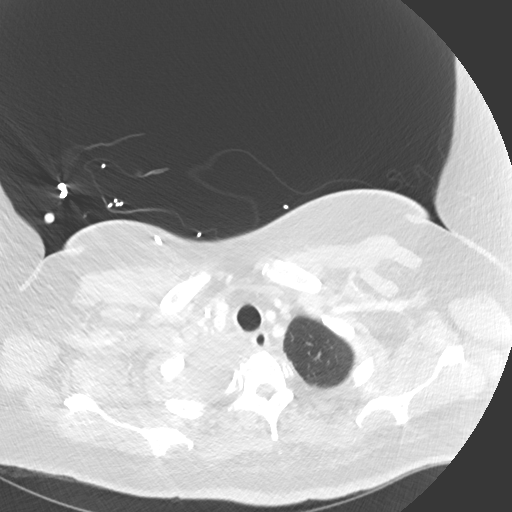
[im 155/168  lung]
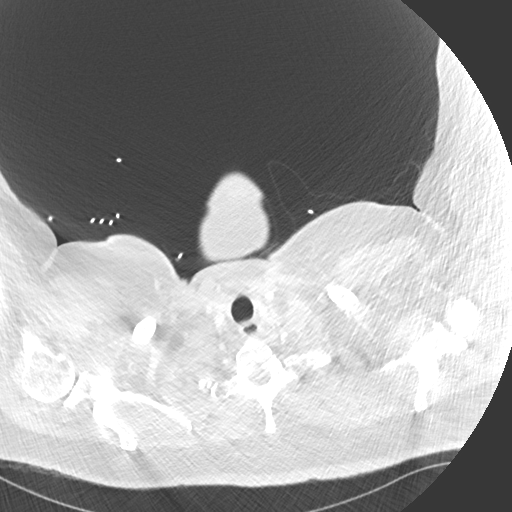

[Series 5: coronal · coronal · 0.70mm/px · 3 of 129 slices shown]
[im 26/129  lung]
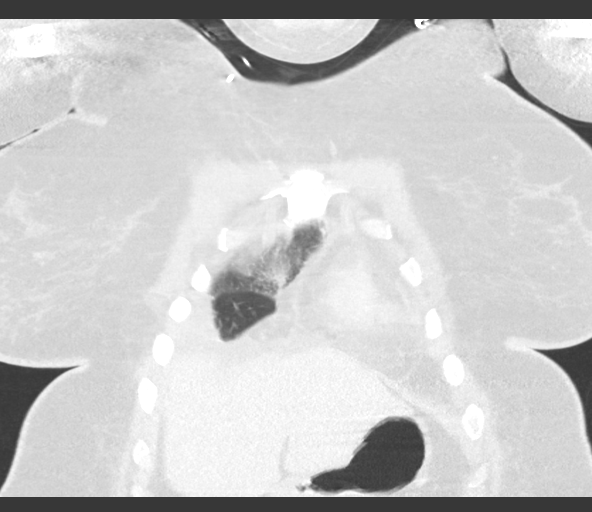
[im 52/129  lung]
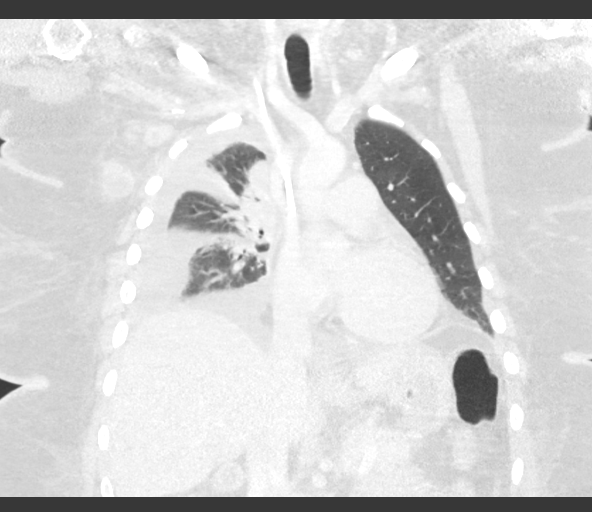
[im 77/129  lung]
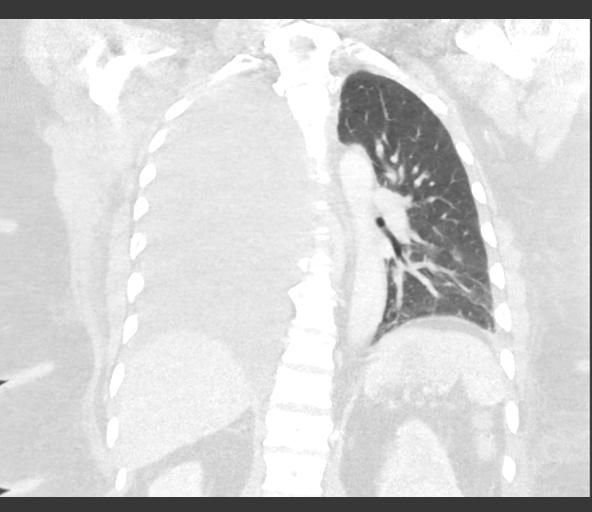

[15 of 36 positions shown; findings below may reference images not displayed]

FINDINGS: Cardiovascular: Right-sided Port-A-Cath terminates at the superior
caval/atrial junction. Aortic atherosclerosis. Tortuous thoracic
aorta. Mild cardiomegaly, without pericardial effusion. Limited
evaluation for pulmonary embolism on this nondedicated study.

Mild degradation secondary to patient size and motion.

Mediastinum/Nodes: Right paratracheal node measures 1.6 cm on 54/2.
On the order of 2.1 cm on the prior exam (when remeasured).

No left hilar adenopathy. Limited evaluation for right hilar
adenopathy.

Necrotic right axillary adenopathy, including at 1.9 x 2.6 cm on
57/2. Compare 2.0 x 3.6 cm on the prior exam.

Retrocrural node of 1.5 cm on 132/2 versus 1.3 cm on the prior exam
(when remeasured).

Lungs/Pleura: Moderate right pleural effusion is similar to
01/01/2019 CT. A small left pleural effusion is new since that exam.

Compression of right lower lobe bronchi.

Mild subsegmental atelectasis at the left lung base.

Inferior/posterior right upper, right middle, and posterior right
lower lobe collapse/consolidation.

Upper Abdomen: Normal imaged portions of the liver, spleen, stomach,
pancreas, adrenal glands, kidneys. Motion degradation continuing
into the upper abdomen. The gallbladder appears heterogeneous, at
least partially due to motion.

Musculoskeletal: Moderate thoracic spondylosis.
IMPRESSION: 1. Degradation secondary to motion and patient body habitus.
2. Moderate right pleural effusion with multifocal right-sided
dependent pulmonary opacities, favored to represent compressive
atelectasis.
3. Redemonstration of thoracic adenopathy. Improved in the right
axillary and right paratracheal stations, increased in the
retrocrural space.
4. Apparent heterogeneity of the gallbladder, likely due to motion
degradation. If there are right upper quadrant symptoms., Correlate
with ultrasound.
5. Aortic Atherosclerosis ([id number]).

## 2019-12-13 IMAGING — DX DG CHEST 1V PORT
1 series · 1 of 1 positions shown · non-contrast
Comparison: January 14, 2019 chest radiograph and chest CT

CLINICAL DATA: Lung carcinoma with shortness of breath

EXAM:
PORTABLE CHEST 1 VIEW

[chest ap]
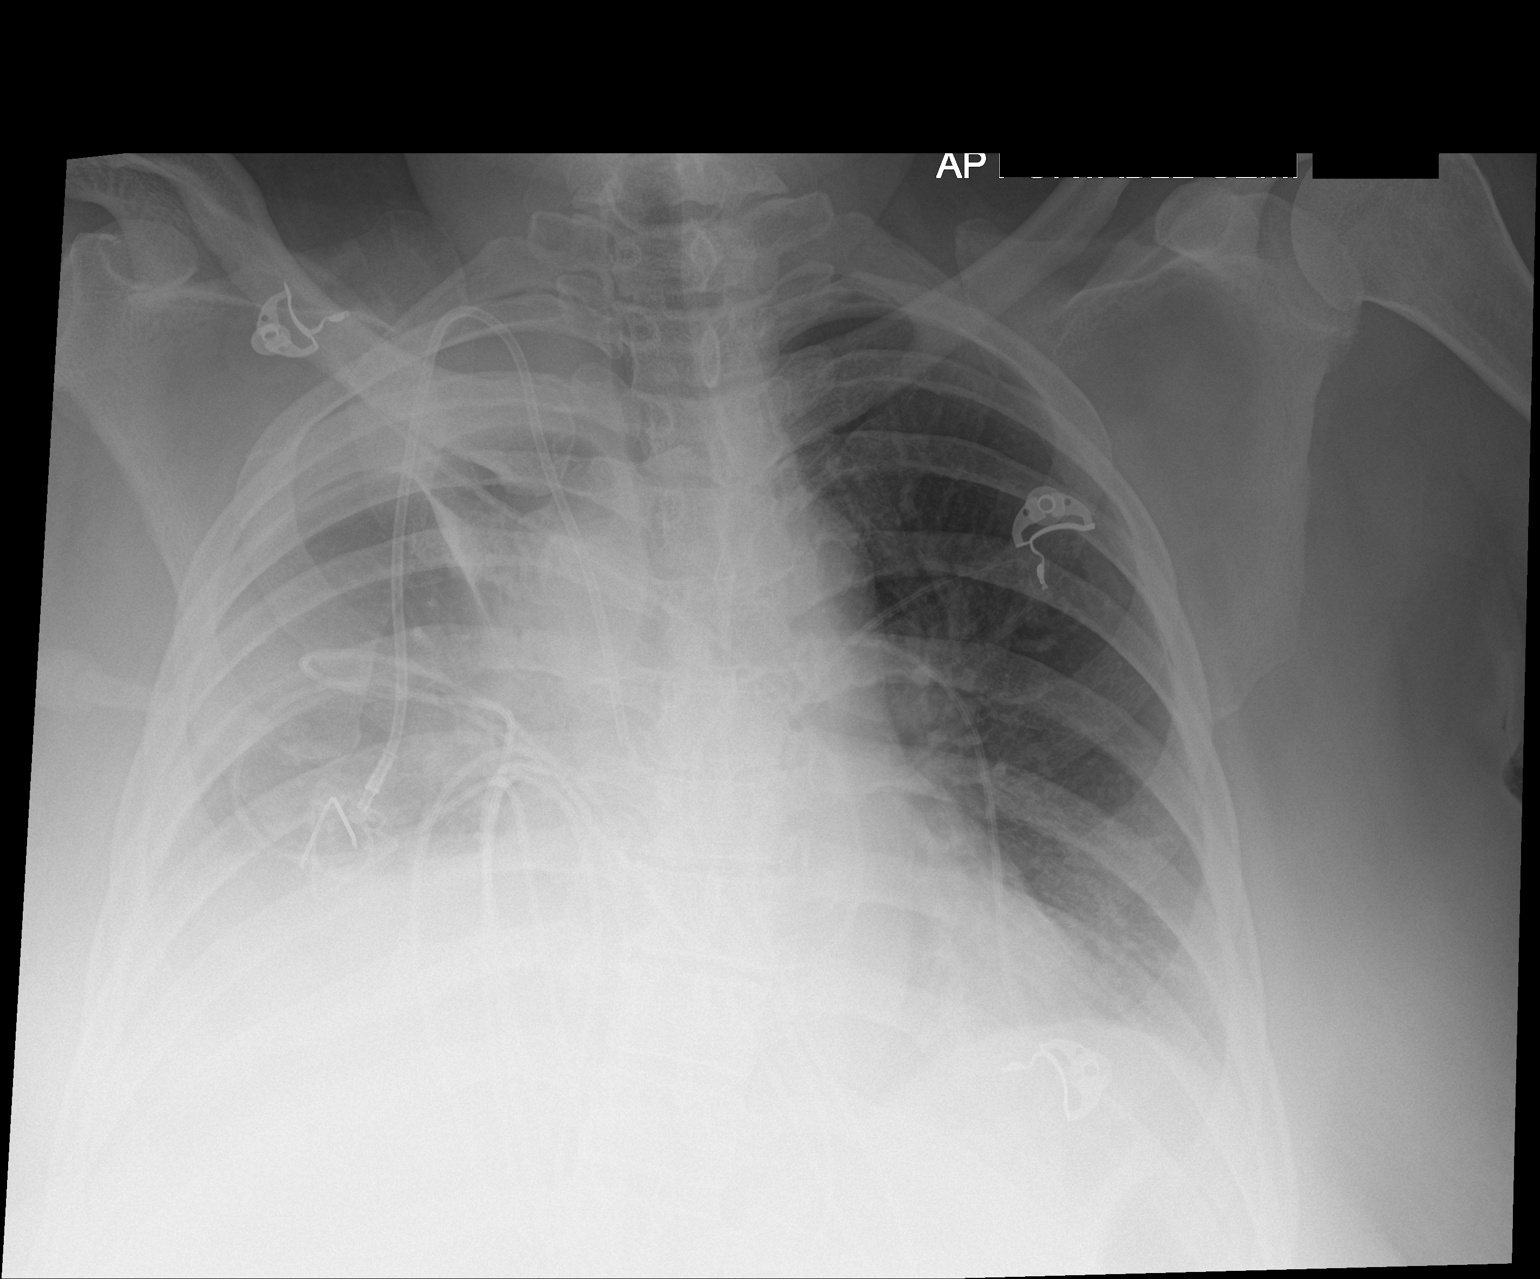

[1 of 1 positions shown; findings below may reference images not displayed]

FINDINGS: There is extensive opacification throughout much of the right lung
consistent with a combination effusion and patchy consolidation. The
left lung is clear. Heart is upper normal in size with pulmonary
vascularity normal. No adenopathy is appreciable by radiography.
Port-A-Cath tip is in the superior vena cava. No bone lesions. No
pneumothorax.
IMPRESSION: Combination of effusion and consolidation with opacification of much
of the right lung. Left lung essentially clear. Stable cardiac
silhouette. No adenopathy appreciable by radiography. Port-A-Cath
tip in superior vena cava. No pneumothorax.

## 2019-12-14 IMAGING — DX DG CHEST 1V PORT
1 series · 1 of 1 positions shown · non-contrast
Comparison: Single-view of the chest 01/15/2019.

CLINICAL DATA: Status post right pleural drainage catheter
placement today.

EXAM:
PORTABLE CHEST 1 VIEW

[chest ap]
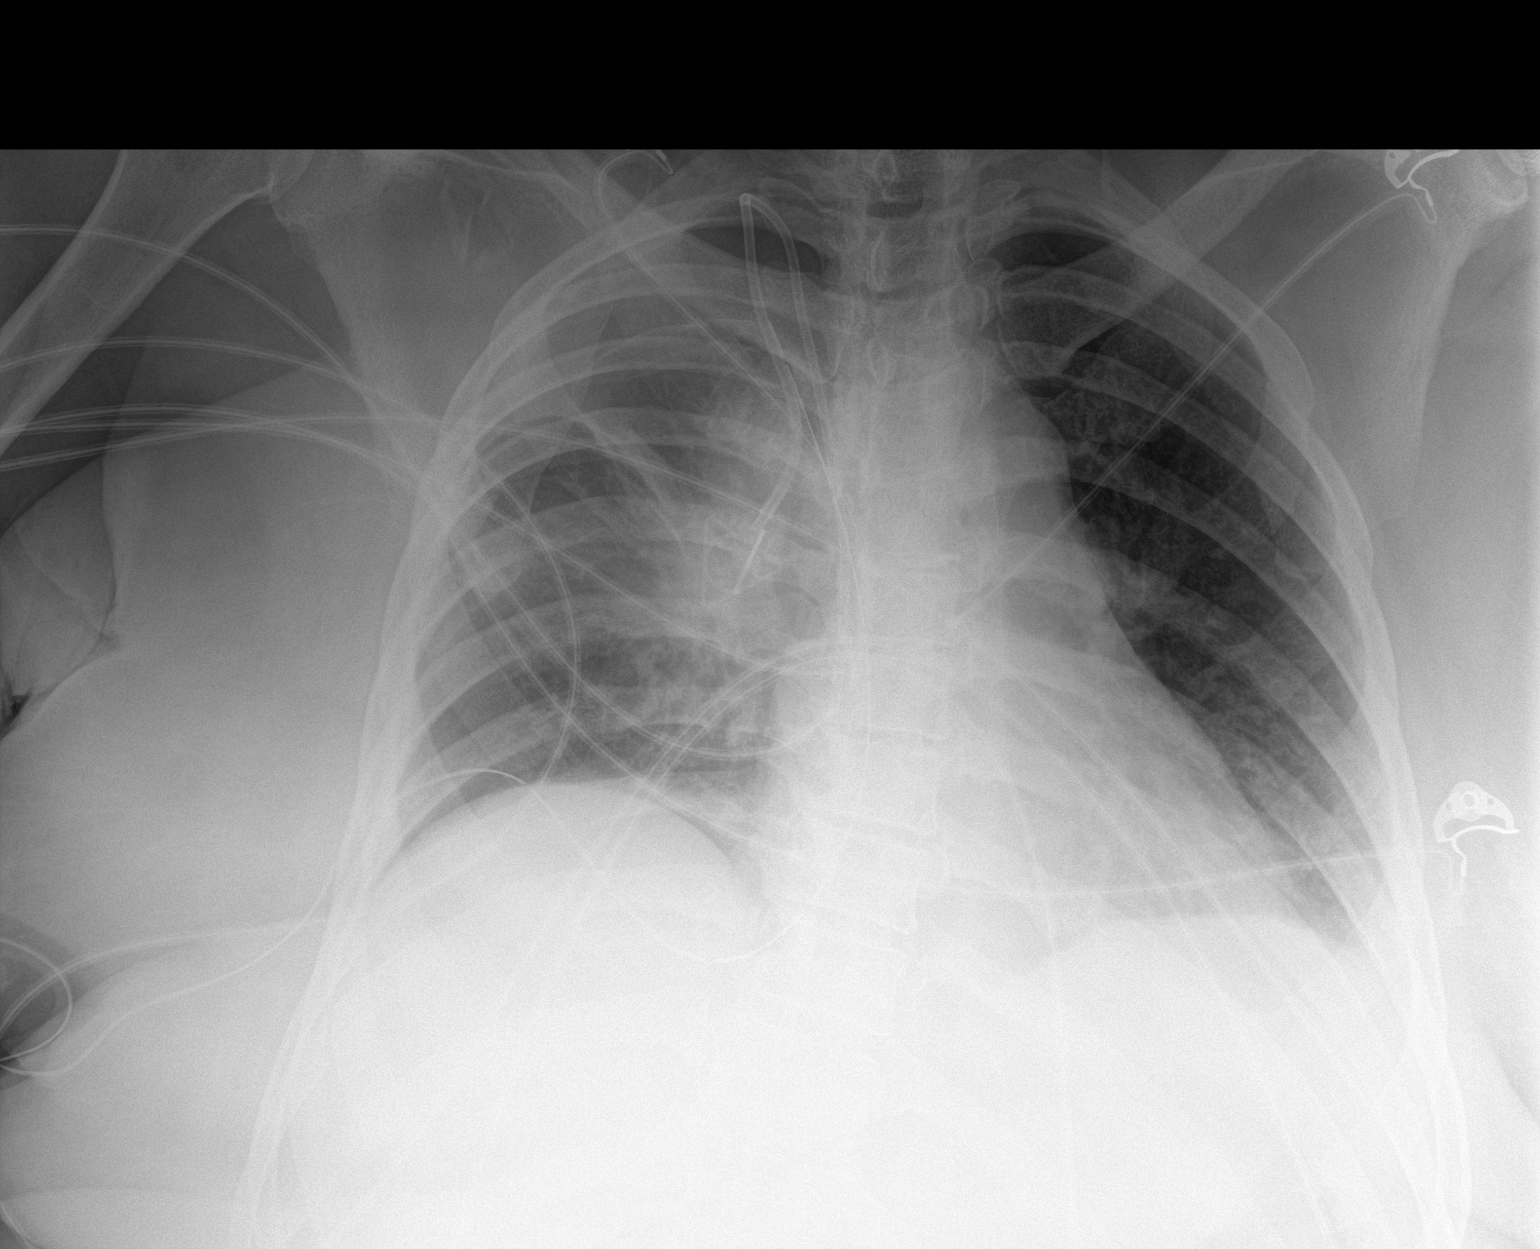

[1 of 1 positions shown; findings below may reference images not displayed]

FINDINGS: New pleural drainage catheter is in place and courses along the
right hemidiaphragm and paravertebral space with its tip in the
right upper lung zone. Right pleural effusion is decreased. There is
a right pneumothorax estimated at 20%. The left lung is expanded and
clear. Heart size is normal. Port-A-Cath noted.
IMPRESSION: New pleural drainage catheter in place. Right pleural effusion has
decreased but there is a right pneumothorax estimated 20%.

Critical Value/emergent results were called by telephone at the time
of interpretation on 01/16/2019 at [DATE] to provider[REDACTED],
RN, who verbally acknowledged these results.

## 2019-12-15 IMAGING — DX DG CHEST 1V PORT
1 series · 1 of 1 positions shown · non-contrast
Comparison: Chest x-ray 01/16/2019.

CLINICAL DATA: 53-year-old female with history of chest tube.

EXAM:
PORTABLE CHEST 1 VIEW

[chest ap]
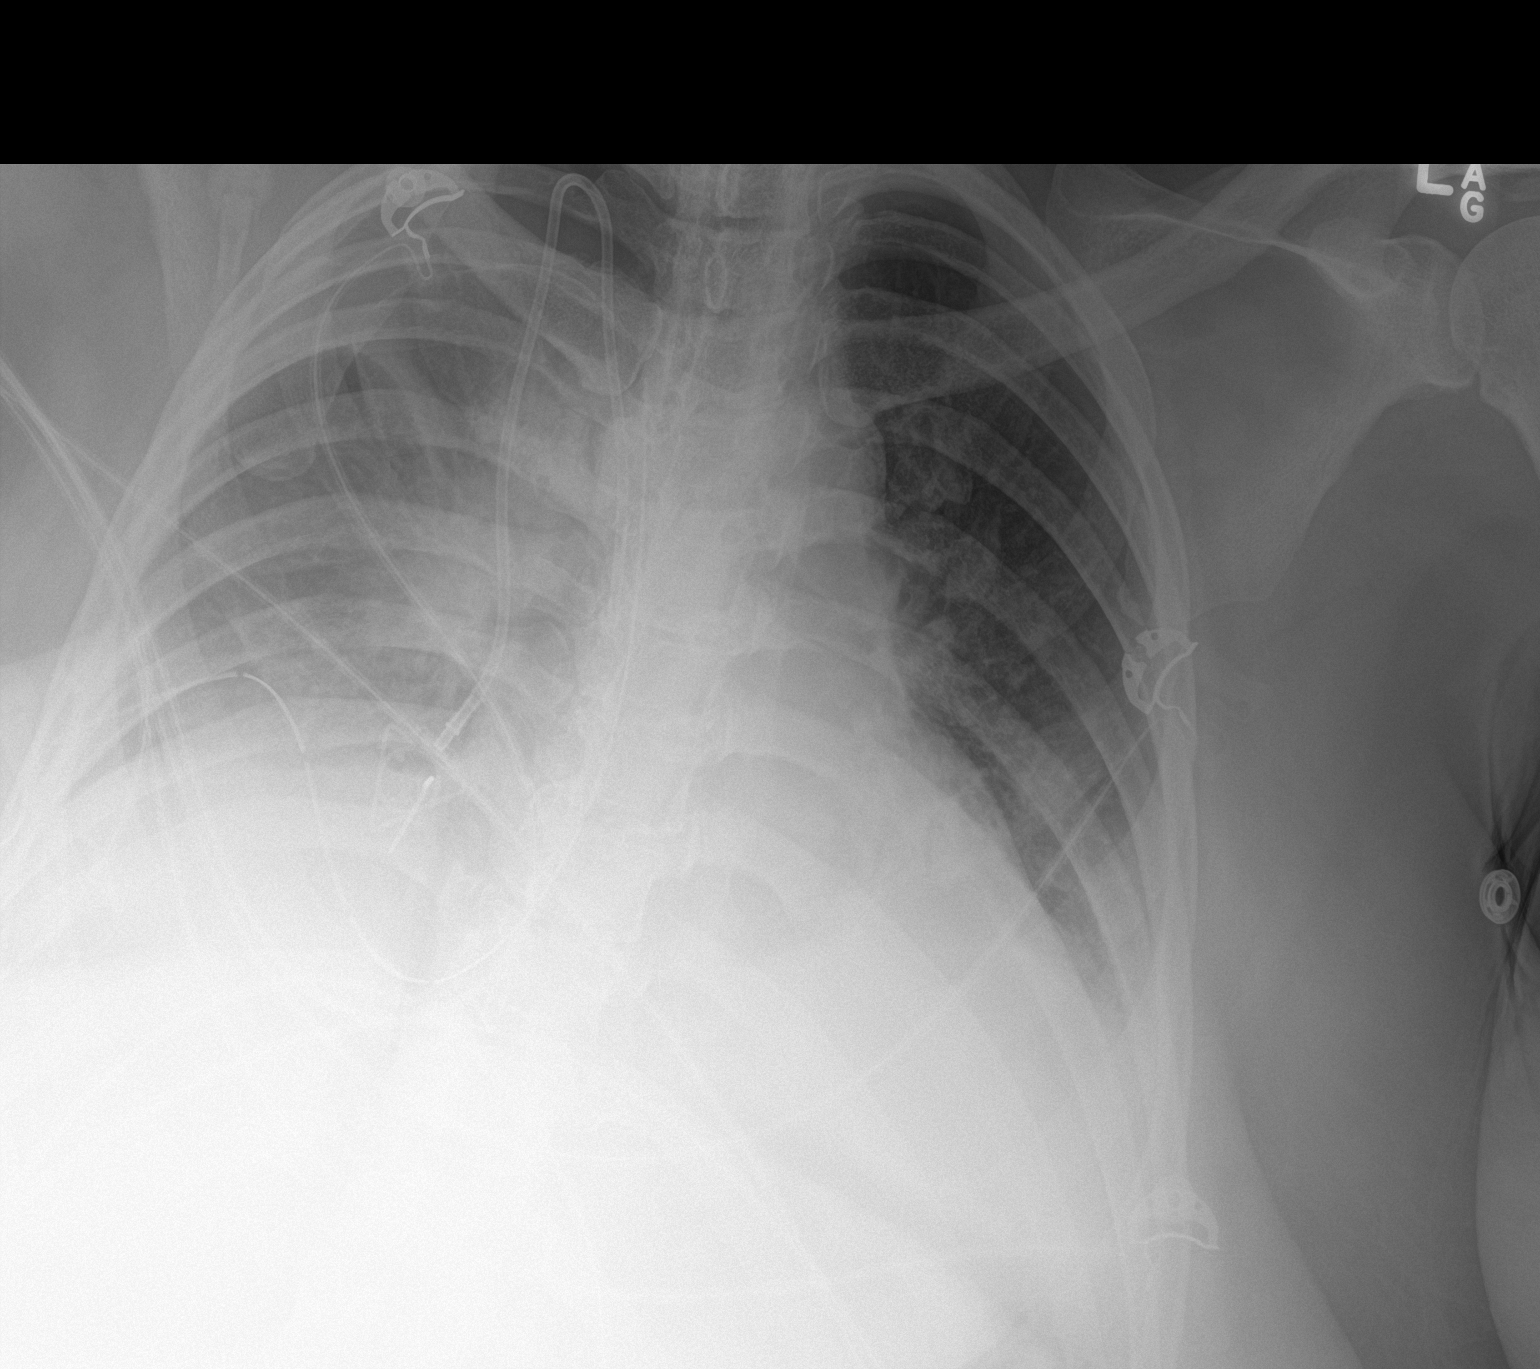

[1 of 1 positions shown; findings below may reference images not displayed]

FINDINGS: Right-sided chest tube in position with tip in the medial aspect of
the upper right hemithorax. There continues to be a moderate-sized
right-sided pneumothorax. Probable significant volume of right-sided
pleural fluid also noted. Widespread airspace consolidation in the
right lung, and to a lesser extent in the left base. Small left
pleural effusion. No evidence of pulmonary edema. Heart size is
normal. Upper mediastinal contours are distorted by patient's
rotation to the left. Right internal jugular single-lumen porta cath
with tip terminating in the distal superior vena cava.
IMPRESSION: 1. Support apparatus, as above.
2. Persistent moderate right hydropneumothorax.
3. Small left pleural effusion.
4. Areas of airspace consolidation in the lungs bilaterally (right
greater than left), with worsening aeration compared to the prior
study.

## 2019-12-16 IMAGING — DX DG CHEST 1V PORT
1 series · 1 of 1 positions shown · non-contrast
Comparison: Radiograph 01/17/2019

CLINICAL DATA: Pleural effusion

EXAM:
PORTABLE CHEST 1 VIEW

[chest ap]
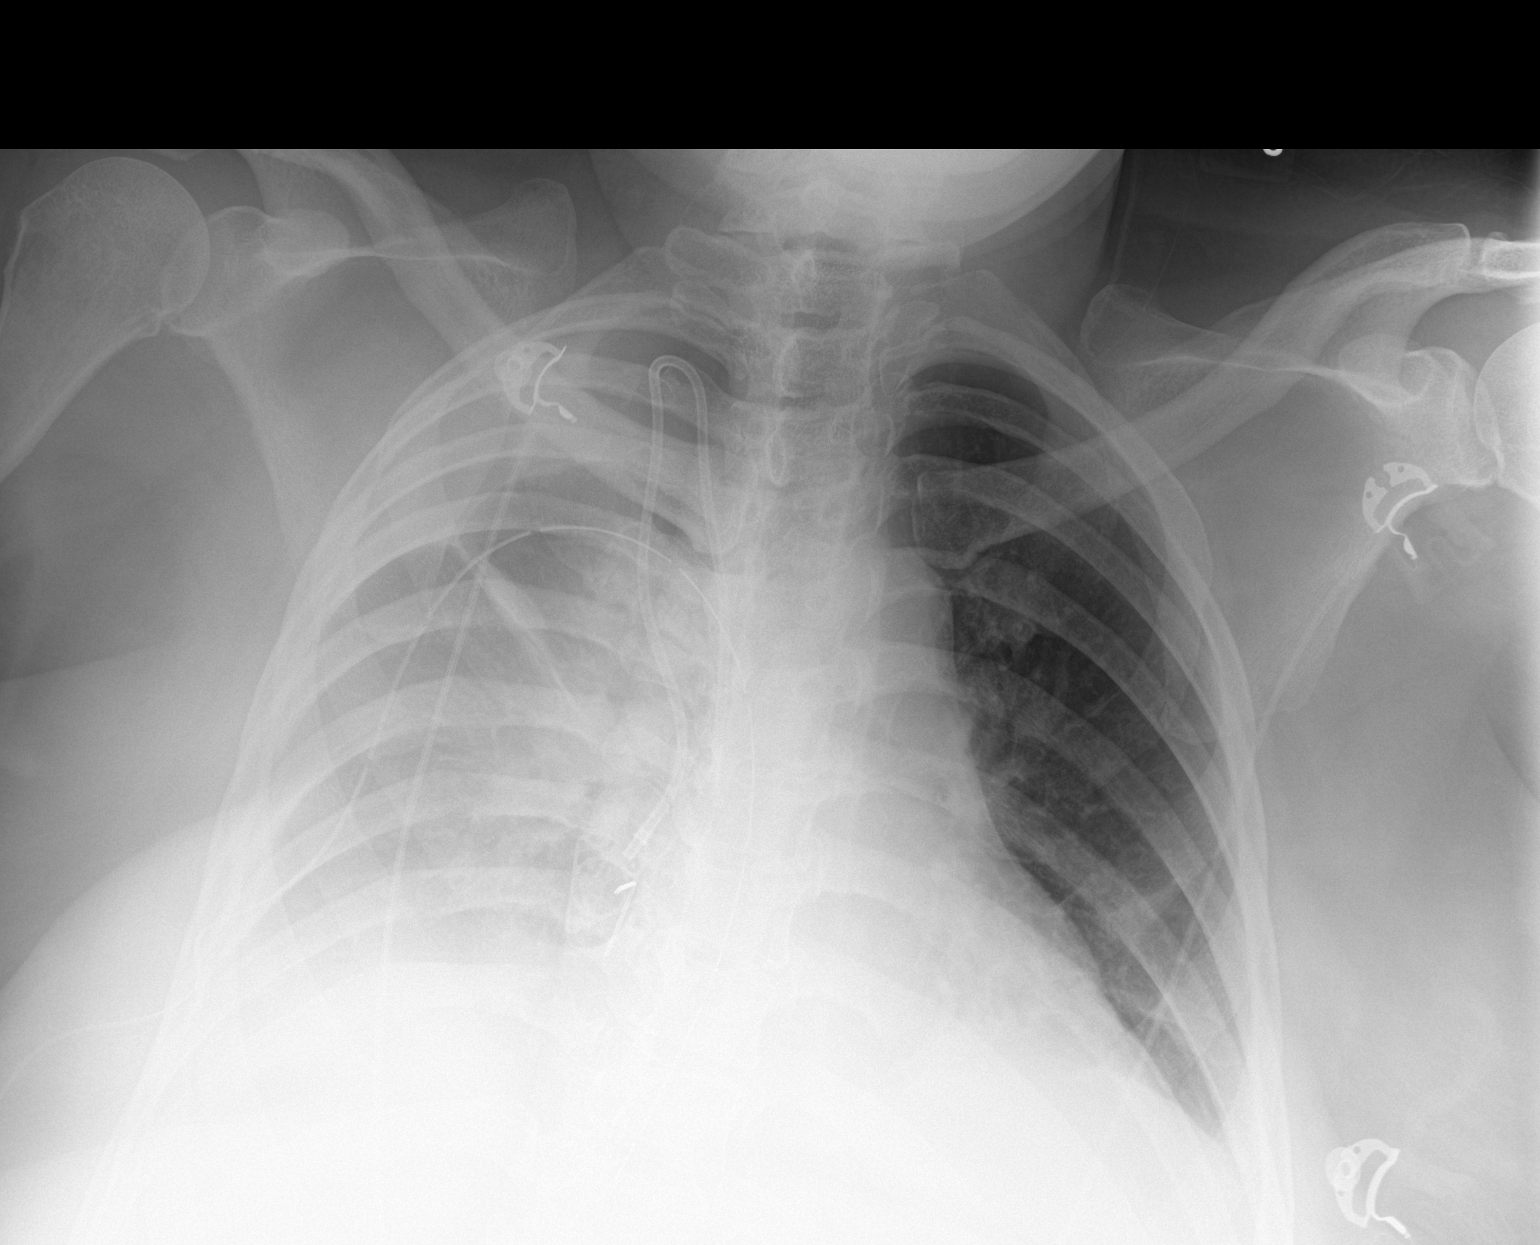

[1 of 1 positions shown; findings below may reference images not displayed]

FINDINGS: Accessed right IJ Port-A-Cath tip terminates in the lower SVC. A
chest tube is seen in the right hemithorax. Asymmetric opacification
of the right lung with fluid tracking along the minor fissure
suggest at least moderate residual effusion. Visceral pleural line
is noted towards the right lung apex. Atelectatic changes are
present in the included portions of the left lung base with likely
trace residual left effusion as well. No acute osseous or soft
tissue abnormality.
IMPRESSION: Satisfactory positioning of the right IJ Port-A-Cath.

Right pleural drain is in place. Suspect at least moderate residual
right hydropneumothorax.

Left basilar atelectasis and trace left effusion

## 2019-12-17 IMAGING — DX DG CHEST 1V PORT
1 series · 1 of 1 positions shown · non-contrast
Comparison: Radiograph 01/18/2019

CLINICAL DATA: Pleural effusion

EXAM:
PORTABLE CHEST 1 VIEW

[chest ap]
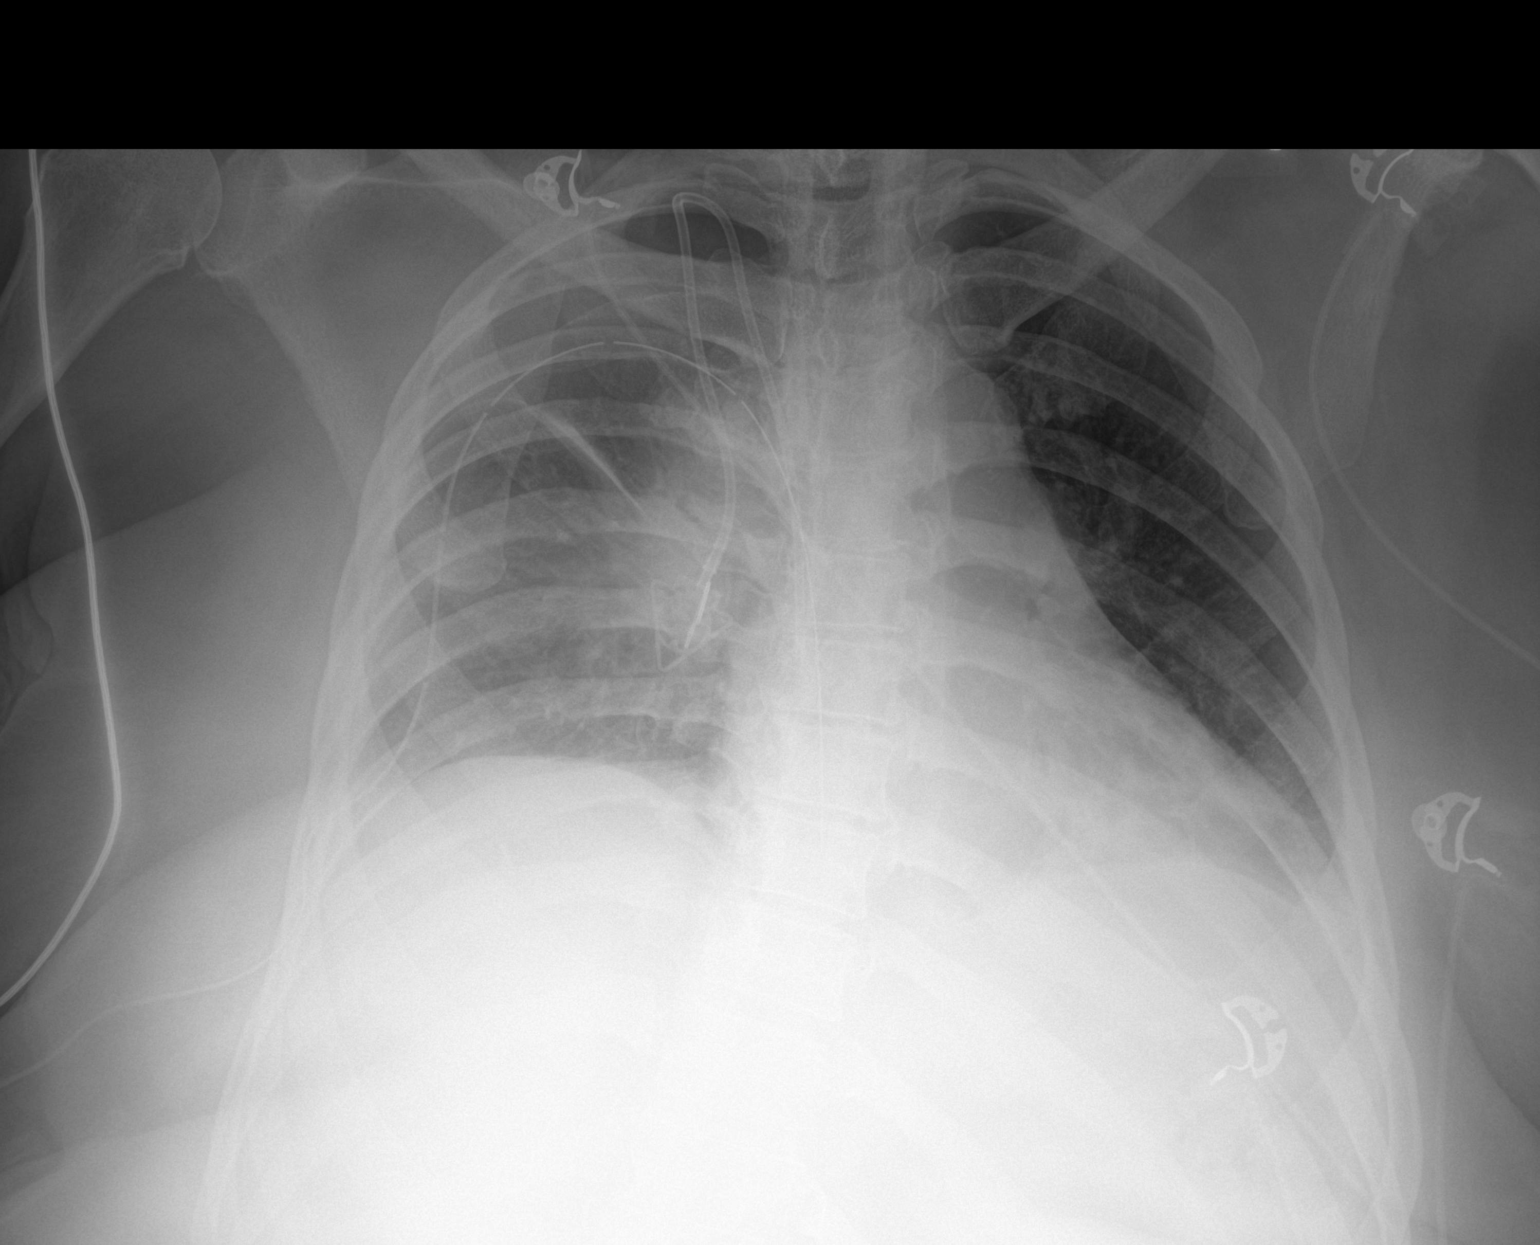

[1 of 1 positions shown; findings below may reference images not displayed]

FINDINGS: Tunneled right IJ Port-A-Cath tip terminates in the lower SVC. Right
pleural drain remains in place with a persistent moderate right
hydropneumothorax. There is persistent right perihilar opacity as
well. Left basilar atelectasis. No convincing left effusion on this
exam. No acute osseous or soft tissue abnormality.
IMPRESSION: 1. Unchanged moderate right hydropneumothorax despite the presence
of a right pleural drain.
2. Right perihilar opacity, favored to reflect atelectasis though
underlying consolidation is not excluded.
3. Left basilar atelectasis, no convincing left effusion on this
exam.

## 2020-01-06 IMAGING — DX DG CHEST 2V
2 series · 2 of 2 positions shown · non-contrast
Comparison: January 19, 2019 chest radiograph and chest CT January 14, 2019

CLINICAL DATA: Pleural effusion

EXAM:
CHEST - 2 VIEW

[chest pa]
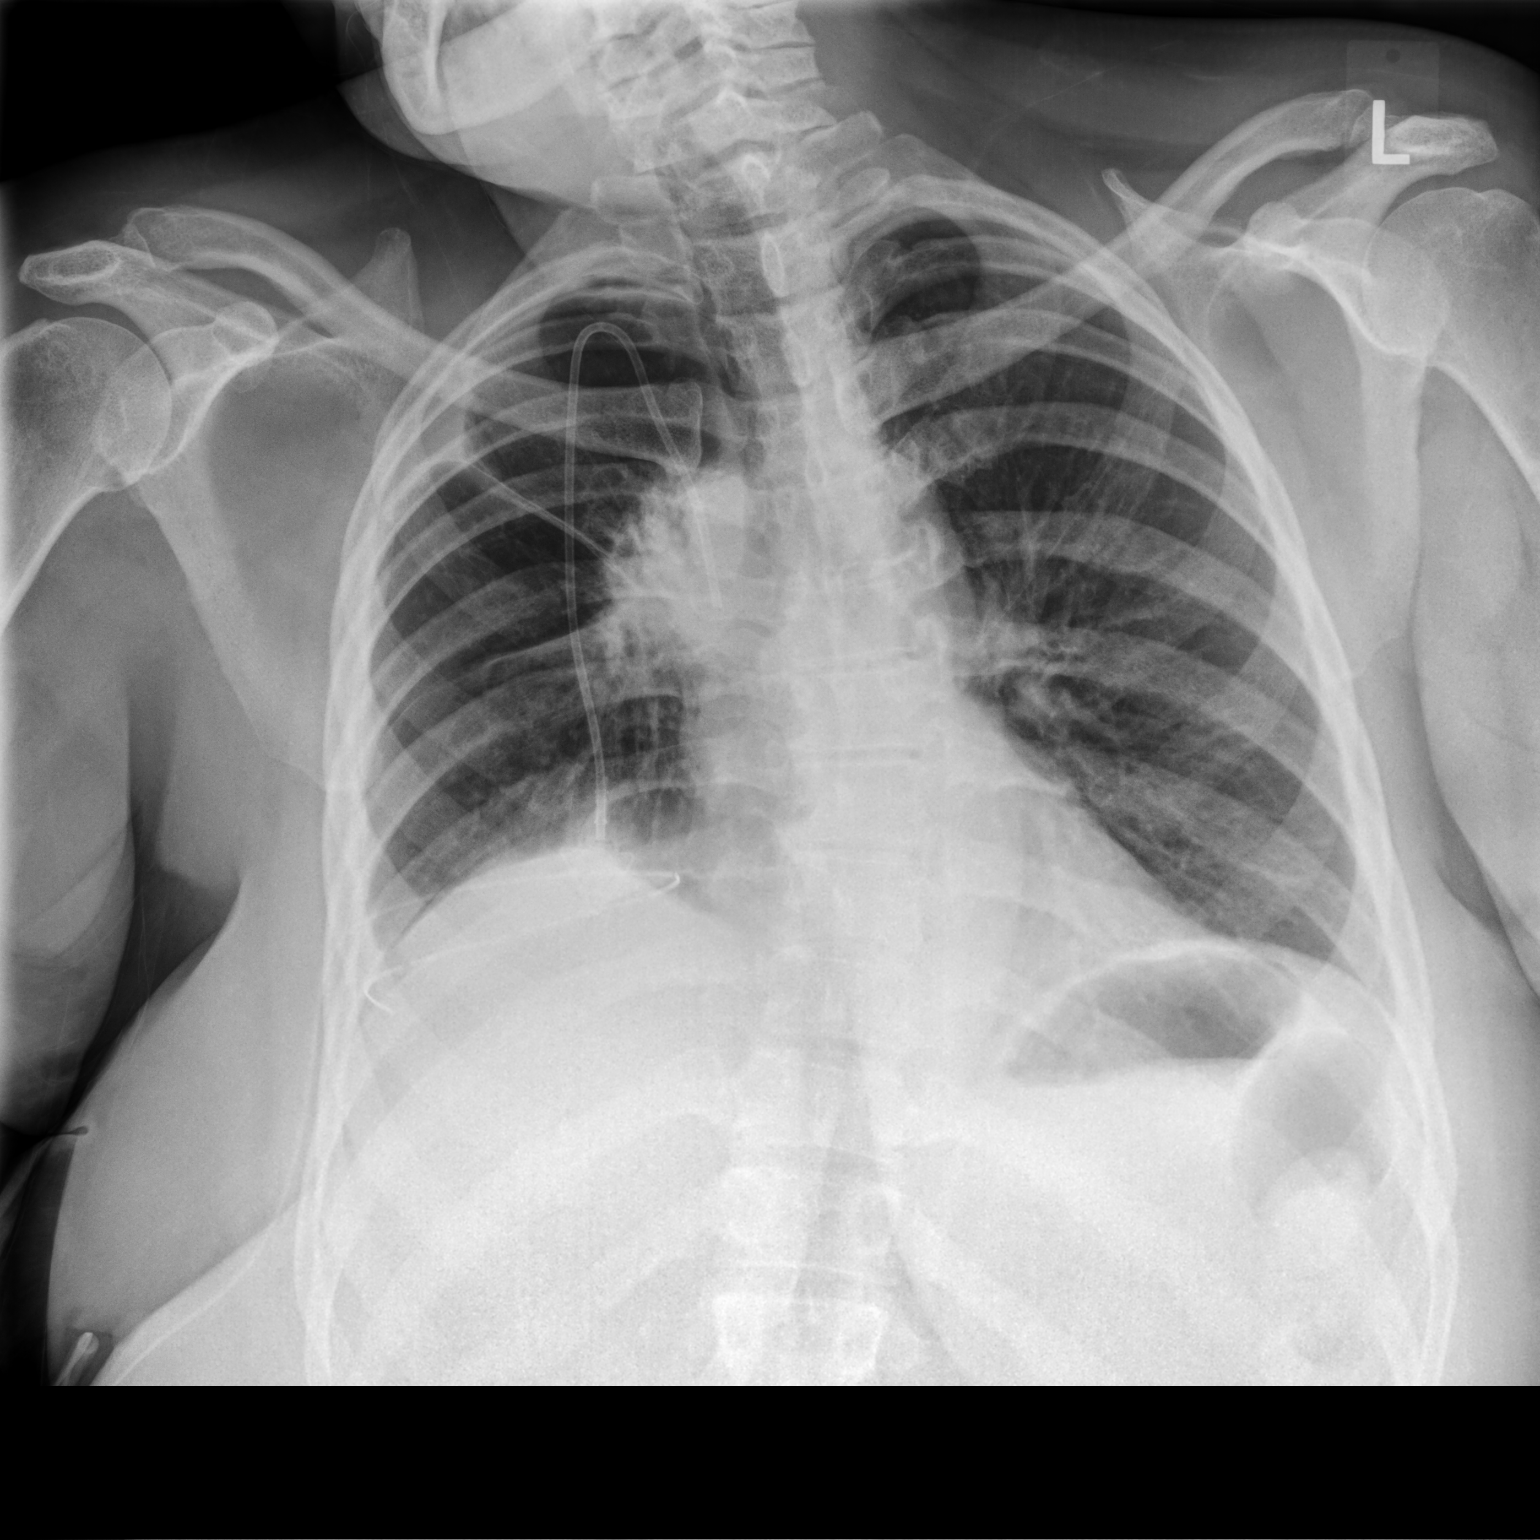

[chest lat]
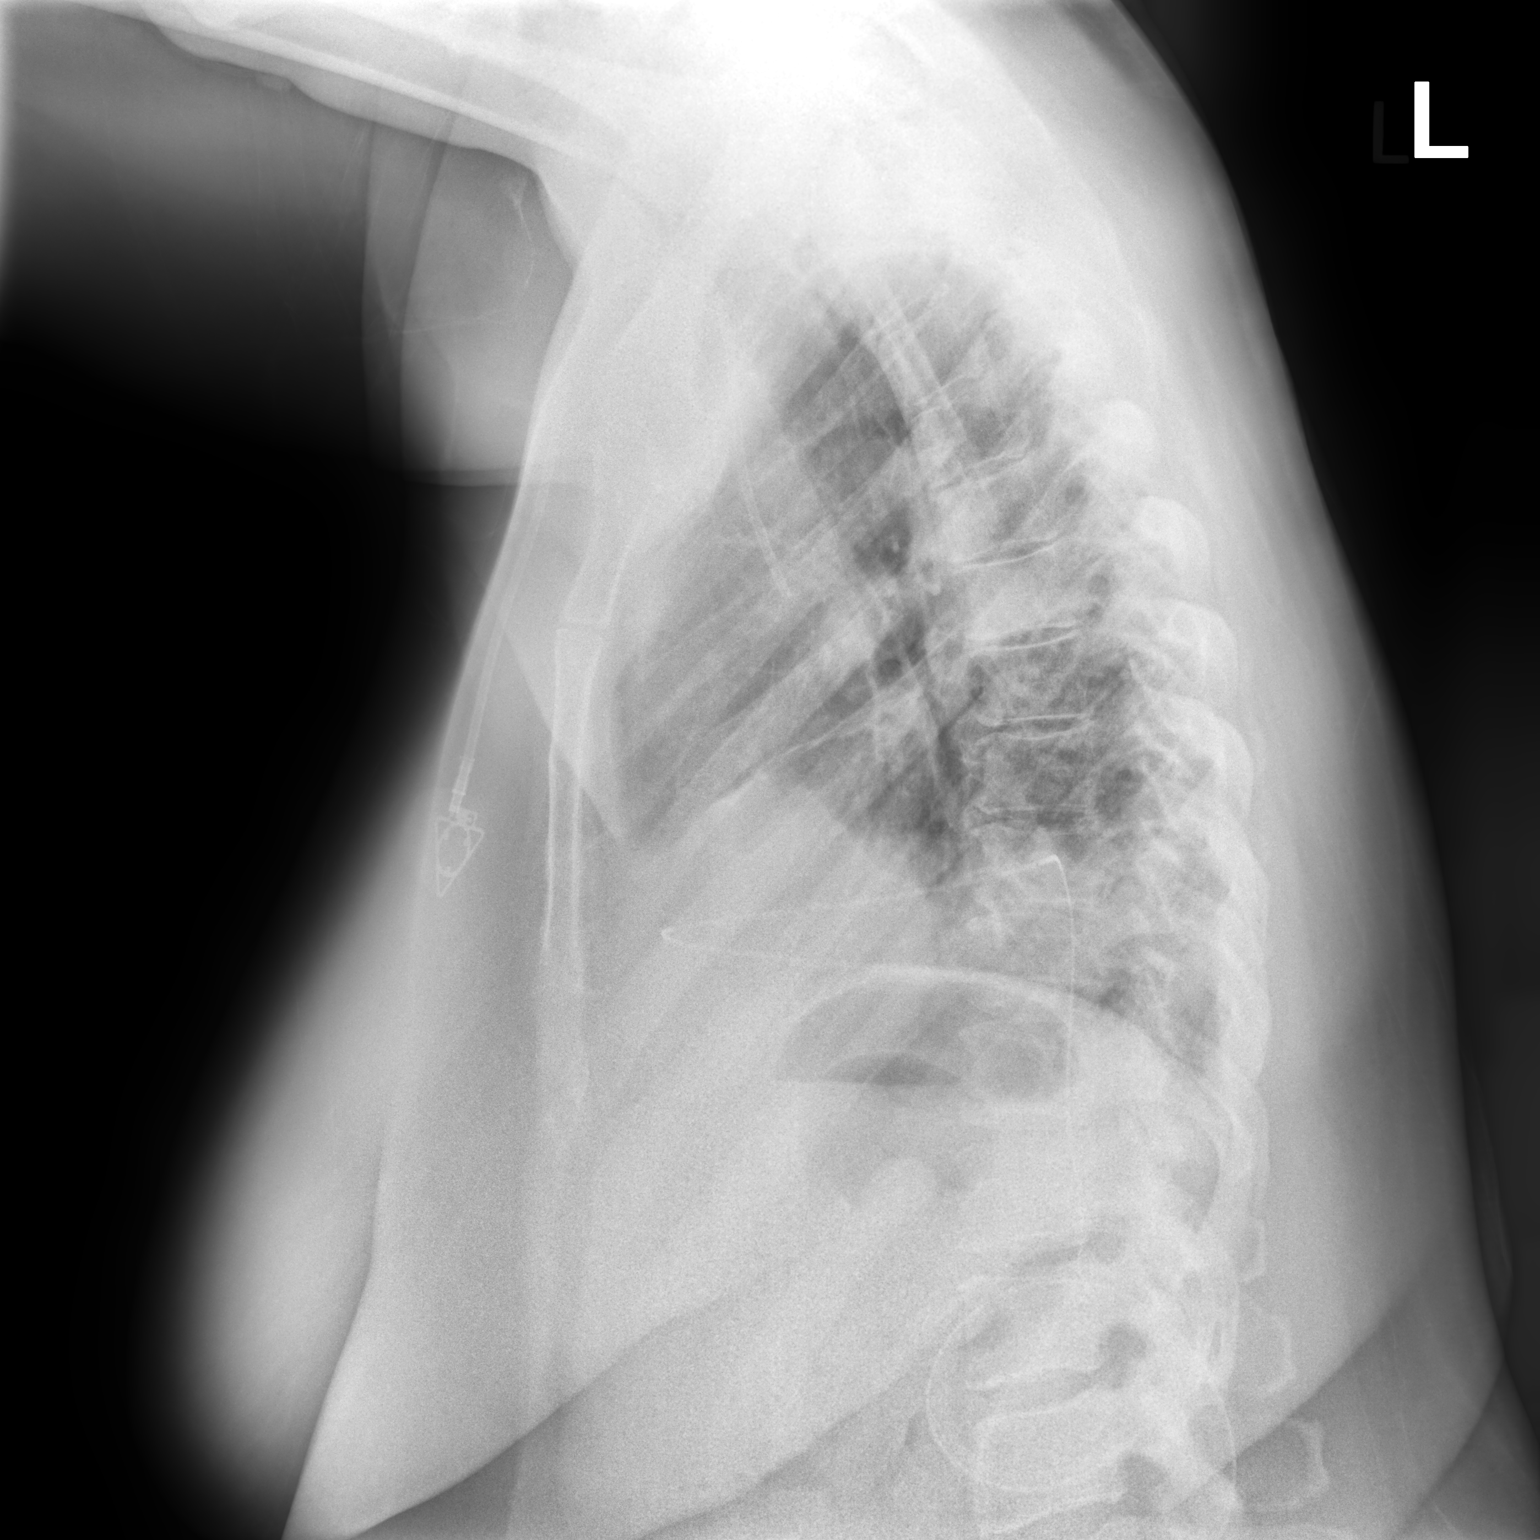

[2 of 2 positions shown; findings below may reference images not displayed]

FINDINGS: PleurX catheter noted in the right base. There is less pleural
effusion evident on the right compared to most recent study. There
is residual effusion in the right base with right base atelectasis.
There is scarring in the right upper lobe. Left lung clear.

Probable post radiation therapy change noted in the right perihilar
region. A degree of underlying adenopathy in this area cannot be
excluded by radiography.

Heart size is normal. Pulmonary vascularity is normal on the left
and distorted on the right.

No bone lesions.
IMPRESSION: Fairly small right pleural effusion with PleurX catheter in place.
Right base atelectasis. Apparent scarring right upper lobe. Suspect
post radiation therapy change right perihilar region. A degree of
residual adenopathy in this area cannot be excluded.

Left lung clear.  Heart size normal.  No pneumothorax.
